# Patient Record
Sex: Male | Born: 1940 | Race: White | Hispanic: No | Marital: Married | Smoking: Former smoker
Health system: Southern US, Community
[De-identification: ages and names within clinical notes are randomized; demographics above are authoritative.]

## PROBLEM LIST (undated history)

## (undated) DIAGNOSIS — J929 Pleural plaque without asbestos: Secondary | ICD-10-CM

## (undated) DIAGNOSIS — J189 Pneumonia, unspecified organism: Secondary | ICD-10-CM

## (undated) DIAGNOSIS — I513 Intracardiac thrombosis, not elsewhere classified: Secondary | ICD-10-CM

## (undated) DIAGNOSIS — K219 Gastro-esophageal reflux disease without esophagitis: Secondary | ICD-10-CM

## (undated) DIAGNOSIS — Z95810 Presence of automatic (implantable) cardiac defibrillator: Secondary | ICD-10-CM

## (undated) DIAGNOSIS — I4589 Other specified conduction disorders: Secondary | ICD-10-CM

## (undated) DIAGNOSIS — Q159 Congenital malformation of eye, unspecified: Secondary | ICD-10-CM

## (undated) DIAGNOSIS — Z79899 Other long term (current) drug therapy: Secondary | ICD-10-CM

## (undated) DIAGNOSIS — I2510 Atherosclerotic heart disease of native coronary artery without angina pectoris: Secondary | ICD-10-CM

## (undated) DIAGNOSIS — I358 Other nonrheumatic aortic valve disorders: Secondary | ICD-10-CM

## (undated) DIAGNOSIS — I872 Venous insufficiency (chronic) (peripheral): Secondary | ICD-10-CM

## (undated) DIAGNOSIS — I472 Ventricular tachycardia: Secondary | ICD-10-CM

## (undated) DIAGNOSIS — E119 Type 2 diabetes mellitus without complications: Secondary | ICD-10-CM

## (undated) DIAGNOSIS — K648 Other hemorrhoids: Secondary | ICD-10-CM

## (undated) DIAGNOSIS — M549 Dorsalgia, unspecified: Secondary | ICD-10-CM

## (undated) DIAGNOSIS — I4892 Unspecified atrial flutter: Secondary | ICD-10-CM

## (undated) DIAGNOSIS — I313 Pericardial effusion (noninflammatory): Secondary | ICD-10-CM

## (undated) DIAGNOSIS — I219 Acute myocardial infarction, unspecified: Secondary | ICD-10-CM

## (undated) DIAGNOSIS — M4800 Spinal stenosis, site unspecified: Secondary | ICD-10-CM

## (undated) DIAGNOSIS — I340 Nonrheumatic mitral (valve) insufficiency: Secondary | ICD-10-CM

## (undated) DIAGNOSIS — I3139 Other pericardial effusion (noninflammatory): Secondary | ICD-10-CM

## (undated) DIAGNOSIS — G8929 Other chronic pain: Secondary | ICD-10-CM

## (undated) DIAGNOSIS — I82419 Acute embolism and thrombosis of unspecified femoral vein: Secondary | ICD-10-CM

## (undated) DIAGNOSIS — I808 Phlebitis and thrombophlebitis of other sites: Secondary | ICD-10-CM

## (undated) DIAGNOSIS — I255 Ischemic cardiomyopathy: Secondary | ICD-10-CM

## (undated) DIAGNOSIS — I714 Abdominal aortic aneurysm, without rupture, unspecified: Secondary | ICD-10-CM

## (undated) DIAGNOSIS — M1990 Unspecified osteoarthritis, unspecified site: Secondary | ICD-10-CM

## (undated) DIAGNOSIS — M109 Gout, unspecified: Secondary | ICD-10-CM

## (undated) DIAGNOSIS — J3489 Other specified disorders of nose and nasal sinuses: Secondary | ICD-10-CM

## (undated) DIAGNOSIS — I10 Essential (primary) hypertension: Secondary | ICD-10-CM

## (undated) DIAGNOSIS — E663 Overweight: Secondary | ICD-10-CM

## (undated) DIAGNOSIS — I77810 Thoracic aortic ectasia: Secondary | ICD-10-CM

## (undated) DIAGNOSIS — R0602 Shortness of breath: Secondary | ICD-10-CM

## (undated) DIAGNOSIS — Q799 Congenital malformation of musculoskeletal system, unspecified: Secondary | ICD-10-CM

## (undated) DIAGNOSIS — R0600 Dyspnea, unspecified: Secondary | ICD-10-CM

## (undated) DIAGNOSIS — Z7901 Long term (current) use of anticoagulants: Secondary | ICD-10-CM

## (undated) DIAGNOSIS — D45 Polycythemia vera: Principal | ICD-10-CM

## (undated) DIAGNOSIS — N183 Chronic kidney disease, stage 3 unspecified: Secondary | ICD-10-CM

## (undated) DIAGNOSIS — J90 Pleural effusion, not elsewhere classified: Secondary | ICD-10-CM

## (undated) DIAGNOSIS — K439 Ventral hernia without obstruction or gangrene: Secondary | ICD-10-CM

## (undated) DIAGNOSIS — D126 Benign neoplasm of colon, unspecified: Secondary | ICD-10-CM

## (undated) DIAGNOSIS — Z950 Presence of cardiac pacemaker: Secondary | ICD-10-CM

## (undated) DIAGNOSIS — Z789 Other specified health status: Secondary | ICD-10-CM

## (undated) DIAGNOSIS — L819 Disorder of pigmentation, unspecified: Secondary | ICD-10-CM

## (undated) DIAGNOSIS — I5022 Chronic systolic (congestive) heart failure: Secondary | ICD-10-CM

## (undated) DIAGNOSIS — E785 Hyperlipidemia, unspecified: Secondary | ICD-10-CM

## (undated) DIAGNOSIS — I4891 Unspecified atrial fibrillation: Secondary | ICD-10-CM

## (undated) DIAGNOSIS — K5790 Diverticulosis of intestine, part unspecified, without perforation or abscess without bleeding: Secondary | ICD-10-CM

## (undated) DIAGNOSIS — R000 Tachycardia, unspecified: Secondary | ICD-10-CM

## (undated) DIAGNOSIS — Z9981 Dependence on supplemental oxygen: Secondary | ICD-10-CM

## (undated) DIAGNOSIS — J449 Chronic obstructive pulmonary disease, unspecified: Secondary | ICD-10-CM

## (undated) HISTORY — DX: Ventricular tachycardia: I47.2

## (undated) HISTORY — DX: Ventral hernia without obstruction or gangrene: K43.9

## (undated) HISTORY — DX: Benign neoplasm of colon, unspecified: D12.6

## (undated) HISTORY — DX: Nonrheumatic mitral (valve) insufficiency: I34.0

## (undated) HISTORY — DX: Disorder of pigmentation, unspecified: L81.9

## (undated) HISTORY — DX: Chronic kidney disease, stage 3 unspecified: N18.30

## (undated) HISTORY — PX: INCISION AND DRAINAGE ABSCESS / HEMATOMA OF BURSA / KNEE / THIGH: SUR668

## (undated) HISTORY — DX: Chronic obstructive pulmonary disease, unspecified: J44.9

## (undated) HISTORY — DX: Acute myocardial infarction, unspecified: I21.9

## (undated) HISTORY — DX: Polycythemia vera: D45

## (undated) HISTORY — DX: Atherosclerotic heart disease of native coronary artery without angina pectoris: I25.10

## (undated) HISTORY — PX: ORIF TIBIA & FIBULA FRACTURES: SHX2131

## (undated) HISTORY — DX: Essential (primary) hypertension: I10

## (undated) HISTORY — DX: Tachycardia, unspecified: R00.0

## (undated) HISTORY — PX: CATARACT EXTRACTION W/ INTRAOCULAR LENS  IMPLANT, BILATERAL: SHX1307

## (undated) HISTORY — DX: Pericardial effusion (noninflammatory): I31.3

## (undated) HISTORY — PX: BACK SURGERY: SHX140

## (undated) HISTORY — DX: Venous insufficiency (chronic) (peripheral): I87.2

## (undated) HISTORY — DX: Other nonrheumatic aortic valve disorders: I35.8

## (undated) HISTORY — DX: Unspecified atrial flutter: I48.92

## (undated) HISTORY — DX: Pleural effusion, not elsewhere classified: J90

## (undated) HISTORY — DX: Other hemorrhoids: K64.8

## (undated) HISTORY — DX: Congenital malformation of eye, unspecified: Q15.9

## (undated) HISTORY — DX: Presence of automatic (implantable) cardiac defibrillator: Z95.810

## (undated) HISTORY — DX: Other specified conduction disorders: I45.89

## (undated) HISTORY — DX: Congenital malformation of musculoskeletal system, unspecified: Q79.9

## (undated) HISTORY — PX: CARDIAC CATHETERIZATION: SHX172

## (undated) HISTORY — DX: Shortness of breath: R06.02

## (undated) HISTORY — DX: Abdominal aortic aneurysm, without rupture, unspecified: I71.40

## (undated) HISTORY — DX: Ischemic cardiomyopathy: I25.5

## (undated) HISTORY — DX: Hyperlipidemia, unspecified: E78.5

## (undated) HISTORY — DX: Thoracic aortic ectasia: I77.810

## (undated) HISTORY — DX: Overweight: E66.3

## (undated) HISTORY — DX: Intracardiac thrombosis, not elsewhere classified: I51.3

## (undated) HISTORY — DX: Unspecified atrial fibrillation: I48.91

## (undated) HISTORY — DX: Diverticulosis of intestine, part unspecified, without perforation or abscess without bleeding: K57.90

## (undated) HISTORY — DX: Other specified health status: Z78.9

## (undated) HISTORY — DX: Phlebitis and thrombophlebitis of other sites: I80.8

## (undated) HISTORY — DX: Other pericardial effusion (noninflammatory): I31.39

## (undated) HISTORY — DX: Long term (current) use of anticoagulants: Z79.01

## (undated) HISTORY — DX: Other specified disorders of nose and nasal sinuses: J34.89

## (undated) HISTORY — DX: Chronic systolic (congestive) heart failure: I50.22

## (undated) HISTORY — DX: Abdominal aortic aneurysm, without rupture: I71.4

## (undated) HISTORY — PX: COLONOSCOPY W/ POLYPECTOMY: SHX1380

## (undated) HISTORY — PX: FRACTURE SURGERY: SHX138

## (undated) HISTORY — DX: Chronic kidney disease, stage 3 (moderate): N18.3

## (undated) HISTORY — PX: PILONIDAL CYST EXCISION: SHX744

## (undated) HISTORY — DX: Spinal stenosis, site unspecified: M48.00

## (undated) HISTORY — PX: SURGERY SCROTAL / TESTICULAR: SUR1316

---

## 1993-03-21 HISTORY — PX: ANTERIOR CERVICAL DECOMP/DISCECTOMY FUSION: SHX1161

## 2001-10-11 ENCOUNTER — Encounter: Payer: Self-pay | Admitting: Internal Medicine

## 2001-11-02 ENCOUNTER — Encounter: Payer: Self-pay | Admitting: Internal Medicine

## 2002-02-06 ENCOUNTER — Inpatient Hospital Stay (HOSPITAL_COMMUNITY): Admission: AD | Admit: 2002-02-06 | Discharge: 2002-02-12 | Payer: Self-pay | Admitting: Internal Medicine

## 2002-02-06 ENCOUNTER — Encounter: Payer: Self-pay | Admitting: Internal Medicine

## 2002-02-07 ENCOUNTER — Encounter: Payer: Self-pay | Admitting: Cardiology

## 2002-02-07 ENCOUNTER — Encounter: Payer: Self-pay | Admitting: Internal Medicine

## 2002-02-11 ENCOUNTER — Encounter: Payer: Self-pay | Admitting: Internal Medicine

## 2002-03-20 ENCOUNTER — Encounter: Payer: Self-pay | Admitting: Internal Medicine

## 2002-03-21 HISTORY — PX: CORONARY ARTERY BYPASS GRAFT: SHX141

## 2002-03-21 HISTORY — PX: CORONARY ANGIOPLASTY WITH STENT PLACEMENT: SHX49

## 2002-04-09 ENCOUNTER — Ambulatory Visit (HOSPITAL_COMMUNITY): Admission: RE | Admit: 2002-04-09 | Discharge: 2002-04-09 | Payer: Self-pay | Admitting: Cardiology

## 2002-04-19 ENCOUNTER — Encounter: Payer: Self-pay | Admitting: Neurological Surgery

## 2002-04-30 ENCOUNTER — Encounter: Payer: Self-pay | Admitting: Neurological Surgery

## 2002-07-15 ENCOUNTER — Encounter: Payer: Self-pay | Admitting: Neurology

## 2002-07-18 ENCOUNTER — Encounter (INDEPENDENT_AMBULATORY_CARE_PROVIDER_SITE_OTHER): Payer: Self-pay | Admitting: *Deleted

## 2002-07-18 ENCOUNTER — Encounter: Admission: RE | Admit: 2002-07-18 | Discharge: 2002-07-18 | Payer: Self-pay | Admitting: *Deleted

## 2002-07-18 ENCOUNTER — Encounter: Payer: Self-pay | Admitting: *Deleted

## 2002-07-25 ENCOUNTER — Ambulatory Visit (HOSPITAL_COMMUNITY): Admission: RE | Admit: 2002-07-25 | Discharge: 2002-07-27 | Payer: Self-pay | Admitting: *Deleted

## 2002-08-08 ENCOUNTER — Ambulatory Visit (HOSPITAL_COMMUNITY): Admission: RE | Admit: 2002-08-08 | Discharge: 2002-08-09 | Payer: Self-pay | Admitting: *Deleted

## 2002-08-30 ENCOUNTER — Encounter: Payer: Self-pay | Admitting: Diagnostic Radiology

## 2002-08-30 ENCOUNTER — Encounter: Payer: Self-pay | Admitting: Neurological Surgery

## 2002-09-03 ENCOUNTER — Encounter: Payer: Self-pay | Admitting: Surgery

## 2002-09-05 ENCOUNTER — Inpatient Hospital Stay (HOSPITAL_COMMUNITY): Admission: RE | Admit: 2002-09-05 | Discharge: 2002-09-11 | Payer: Self-pay | Admitting: Surgery

## 2002-09-05 ENCOUNTER — Encounter: Payer: Self-pay | Admitting: Surgery

## 2002-09-05 ENCOUNTER — Encounter (INDEPENDENT_AMBULATORY_CARE_PROVIDER_SITE_OTHER): Payer: Self-pay | Admitting: Specialist

## 2002-09-06 ENCOUNTER — Encounter: Payer: Self-pay | Admitting: Surgery

## 2002-09-07 ENCOUNTER — Encounter: Payer: Self-pay | Admitting: Surgery

## 2002-09-08 ENCOUNTER — Encounter: Payer: Self-pay | Admitting: Surgery

## 2002-10-08 ENCOUNTER — Inpatient Hospital Stay (HOSPITAL_COMMUNITY): Admission: RE | Admit: 2002-10-08 | Discharge: 2002-10-11 | Payer: Self-pay | Admitting: *Deleted

## 2002-11-01 ENCOUNTER — Encounter: Payer: Self-pay | Admitting: Neurological Surgery

## 2002-11-05 ENCOUNTER — Encounter: Payer: Self-pay | Admitting: Neurological Surgery

## 2002-11-20 HISTORY — PX: ABDOMINAL AORTIC ANEURYSM REPAIR: SUR1152

## 2002-12-02 ENCOUNTER — Encounter: Payer: Self-pay | Admitting: *Deleted

## 2002-12-03 ENCOUNTER — Encounter: Payer: Self-pay | Admitting: *Deleted

## 2002-12-03 ENCOUNTER — Encounter (INDEPENDENT_AMBULATORY_CARE_PROVIDER_SITE_OTHER): Payer: Self-pay | Admitting: *Deleted

## 2002-12-03 ENCOUNTER — Inpatient Hospital Stay (HOSPITAL_COMMUNITY): Admission: RE | Admit: 2002-12-03 | Discharge: 2002-12-08 | Payer: Self-pay | Admitting: *Deleted

## 2002-12-04 ENCOUNTER — Encounter: Payer: Self-pay | Admitting: *Deleted

## 2003-11-10 ENCOUNTER — Inpatient Hospital Stay (HOSPITAL_COMMUNITY): Admission: AD | Admit: 2003-11-10 | Discharge: 2003-11-12 | Payer: Self-pay | Admitting: Orthopedic Surgery

## 2004-01-26 ENCOUNTER — Ambulatory Visit: Payer: Self-pay | Admitting: Cardiology

## 2004-02-04 ENCOUNTER — Ambulatory Visit: Payer: Self-pay | Admitting: Cardiology

## 2004-02-06 ENCOUNTER — Ambulatory Visit: Payer: Self-pay | Admitting: Cardiology

## 2004-02-06 ENCOUNTER — Ambulatory Visit: Payer: Self-pay

## 2004-03-05 ENCOUNTER — Ambulatory Visit: Payer: Self-pay | Admitting: Cardiology

## 2004-04-02 ENCOUNTER — Ambulatory Visit: Payer: Self-pay | Admitting: Cardiovascular Disease

## 2004-04-02 ENCOUNTER — Ambulatory Visit: Payer: Self-pay | Admitting: Cardiology

## 2004-04-23 ENCOUNTER — Ambulatory Visit: Payer: Self-pay | Admitting: Cardiology

## 2004-04-30 ENCOUNTER — Ambulatory Visit: Payer: Self-pay | Admitting: Cardiology

## 2004-05-27 ENCOUNTER — Ambulatory Visit: Payer: Self-pay | Admitting: Internal Medicine

## 2004-06-03 ENCOUNTER — Ambulatory Visit: Payer: Self-pay | Admitting: Cardiology

## 2004-06-18 ENCOUNTER — Ambulatory Visit: Payer: Self-pay | Admitting: Internal Medicine

## 2004-07-01 ENCOUNTER — Ambulatory Visit: Payer: Self-pay | Admitting: Cardiology

## 2004-07-15 ENCOUNTER — Ambulatory Visit: Payer: Self-pay | Admitting: Internal Medicine

## 2004-07-23 ENCOUNTER — Ambulatory Visit: Payer: Self-pay | Admitting: Cardiology

## 2004-07-23 ENCOUNTER — Ambulatory Visit: Payer: Self-pay | Admitting: Cardiovascular Disease

## 2004-07-30 ENCOUNTER — Ambulatory Visit: Payer: Self-pay | Admitting: Cardiology

## 2004-08-12 ENCOUNTER — Ambulatory Visit: Payer: Self-pay | Admitting: Cardiology

## 2004-08-27 ENCOUNTER — Ambulatory Visit: Payer: Self-pay | Admitting: Cardiology

## 2004-09-01 ENCOUNTER — Ambulatory Visit: Payer: Self-pay | Admitting: Cardiology

## 2004-09-17 ENCOUNTER — Ambulatory Visit: Payer: Self-pay | Admitting: Cardiology

## 2004-10-15 ENCOUNTER — Ambulatory Visit: Payer: Self-pay | Admitting: Cardiology

## 2004-10-27 ENCOUNTER — Ambulatory Visit: Payer: Self-pay | Admitting: Cardiology

## 2004-10-29 ENCOUNTER — Ambulatory Visit: Payer: Self-pay | Admitting: Internal Medicine

## 2004-11-05 ENCOUNTER — Ambulatory Visit: Payer: Self-pay | Admitting: Cardiology

## 2004-11-08 ENCOUNTER — Ambulatory Visit: Payer: Self-pay | Admitting: Cardiology

## 2004-11-11 ENCOUNTER — Ambulatory Visit: Payer: Self-pay | Admitting: Cardiology

## 2004-11-12 ENCOUNTER — Ambulatory Visit: Payer: Self-pay | Admitting: Internal Medicine

## 2004-11-23 ENCOUNTER — Ambulatory Visit: Payer: Self-pay | Admitting: Cardiology

## 2004-12-10 ENCOUNTER — Ambulatory Visit: Payer: Self-pay | Admitting: Cardiology

## 2005-01-07 ENCOUNTER — Ambulatory Visit: Payer: Self-pay | Admitting: Cardiology

## 2005-01-14 ENCOUNTER — Encounter: Admission: RE | Admit: 2005-01-14 | Discharge: 2005-03-20 | Payer: Self-pay | Admitting: Internal Medicine

## 2005-02-04 ENCOUNTER — Ambulatory Visit: Payer: Self-pay | Admitting: Cardiology

## 2005-03-04 ENCOUNTER — Ambulatory Visit: Payer: Self-pay | Admitting: Cardiology

## 2005-03-10 ENCOUNTER — Encounter: Admission: RE | Admit: 2005-03-10 | Discharge: 2005-03-10 | Payer: Self-pay | Admitting: Neurological Surgery

## 2005-04-01 ENCOUNTER — Ambulatory Visit: Payer: Self-pay | Admitting: Cardiology

## 2005-04-05 ENCOUNTER — Ambulatory Visit: Payer: Self-pay | Admitting: Internal Medicine

## 2005-04-08 ENCOUNTER — Ambulatory Visit: Payer: Self-pay | Admitting: Internal Medicine

## 2005-04-22 ENCOUNTER — Ambulatory Visit: Payer: Self-pay | Admitting: Cardiology

## 2005-05-05 ENCOUNTER — Ambulatory Visit: Payer: Self-pay | Admitting: Cardiology

## 2005-05-27 ENCOUNTER — Ambulatory Visit: Payer: Self-pay | Admitting: Cardiology

## 2005-06-08 ENCOUNTER — Ambulatory Visit: Payer: Self-pay | Admitting: Cardiology

## 2005-06-22 ENCOUNTER — Ambulatory Visit: Payer: Self-pay | Admitting: Cardiology

## 2005-07-01 ENCOUNTER — Ambulatory Visit: Payer: Self-pay | Admitting: Internal Medicine

## 2005-07-21 ENCOUNTER — Ambulatory Visit: Payer: Self-pay | Admitting: Cardiology

## 2005-08-05 ENCOUNTER — Ambulatory Visit: Payer: Self-pay | Admitting: Cardiology

## 2005-08-05 ENCOUNTER — Ambulatory Visit: Payer: Self-pay | Admitting: Internal Medicine

## 2005-08-11 ENCOUNTER — Ambulatory Visit: Payer: Self-pay | Admitting: Cardiology

## 2005-08-18 ENCOUNTER — Ambulatory Visit: Payer: Self-pay | Admitting: Cardiology

## 2005-08-25 ENCOUNTER — Ambulatory Visit: Payer: Self-pay

## 2005-08-26 ENCOUNTER — Ambulatory Visit: Payer: Self-pay

## 2005-08-26 ENCOUNTER — Ambulatory Visit: Payer: Self-pay | Admitting: Internal Medicine

## 2005-09-06 ENCOUNTER — Ambulatory Visit: Payer: Self-pay | Admitting: Cardiology

## 2005-09-12 ENCOUNTER — Ambulatory Visit: Payer: Self-pay | Admitting: Cardiology

## 2005-09-26 ENCOUNTER — Ambulatory Visit: Payer: Self-pay | Admitting: Internal Medicine

## 2005-09-30 ENCOUNTER — Ambulatory Visit: Payer: Self-pay | Admitting: Cardiology

## 2005-10-04 ENCOUNTER — Ambulatory Visit: Payer: Self-pay | Admitting: Cardiology

## 2005-10-10 ENCOUNTER — Ambulatory Visit: Payer: Self-pay | Admitting: Cardiology

## 2005-10-11 ENCOUNTER — Ambulatory Visit (HOSPITAL_COMMUNITY): Admission: RE | Admit: 2005-10-11 | Discharge: 2005-10-11 | Payer: Self-pay | Admitting: Cardiovascular Disease

## 2005-10-11 ENCOUNTER — Ambulatory Visit: Payer: Self-pay | Admitting: Cardiology

## 2005-10-18 ENCOUNTER — Ambulatory Visit: Payer: Self-pay | Admitting: Cardiology

## 2005-10-25 ENCOUNTER — Ambulatory Visit: Payer: Self-pay | Admitting: Cardiology

## 2005-10-28 ENCOUNTER — Ambulatory Visit: Payer: Self-pay | Admitting: Internal Medicine

## 2005-11-01 ENCOUNTER — Encounter: Admission: RE | Admit: 2005-11-01 | Discharge: 2005-11-01 | Payer: Self-pay | Admitting: Neurological Surgery

## 2005-11-16 ENCOUNTER — Ambulatory Visit: Payer: Self-pay | Admitting: Cardiology

## 2005-12-05 ENCOUNTER — Inpatient Hospital Stay (HOSPITAL_COMMUNITY): Admission: RE | Admit: 2005-12-05 | Discharge: 2005-12-05 | Payer: Self-pay | Admitting: Neurological Surgery

## 2005-12-13 ENCOUNTER — Ambulatory Visit: Payer: Self-pay | Admitting: Cardiovascular Disease

## 2005-12-20 ENCOUNTER — Ambulatory Visit: Payer: Self-pay | Admitting: Cardiology

## 2005-12-20 ENCOUNTER — Ambulatory Visit: Payer: Self-pay | Admitting: *Deleted

## 2005-12-30 ENCOUNTER — Ambulatory Visit: Payer: Self-pay | Admitting: Cardiology

## 2005-12-31 ENCOUNTER — Emergency Department (HOSPITAL_COMMUNITY): Admission: EM | Admit: 2005-12-31 | Discharge: 2006-01-01 | Payer: Self-pay | Admitting: Emergency Medicine

## 2006-01-25 ENCOUNTER — Ambulatory Visit: Payer: Self-pay | Admitting: Internal Medicine

## 2006-01-26 ENCOUNTER — Ambulatory Visit: Payer: Self-pay | Admitting: Internal Medicine

## 2006-01-27 ENCOUNTER — Ambulatory Visit (HOSPITAL_COMMUNITY): Admission: RE | Admit: 2006-01-27 | Discharge: 2006-01-27 | Payer: Self-pay | Admitting: Neurological Surgery

## 2006-02-13 ENCOUNTER — Ambulatory Visit: Payer: Self-pay | Admitting: Cardiology

## 2006-02-20 ENCOUNTER — Encounter (INDEPENDENT_AMBULATORY_CARE_PROVIDER_SITE_OTHER): Payer: Self-pay | Admitting: *Deleted

## 2006-02-21 ENCOUNTER — Ambulatory Visit: Payer: Self-pay | Admitting: Cardiology

## 2006-03-07 ENCOUNTER — Ambulatory Visit: Payer: Self-pay | Admitting: Cardiology

## 2006-03-21 DIAGNOSIS — E119 Type 2 diabetes mellitus without complications: Secondary | ICD-10-CM

## 2006-03-21 HISTORY — DX: Type 2 diabetes mellitus without complications: E11.9

## 2006-03-29 ENCOUNTER — Ambulatory Visit: Payer: Self-pay | Admitting: Cardiology

## 2006-04-07 ENCOUNTER — Ambulatory Visit: Payer: Self-pay | Admitting: Internal Medicine

## 2006-04-26 ENCOUNTER — Ambulatory Visit: Payer: Self-pay | Admitting: Internal Medicine

## 2006-04-26 LAB — CONVERTED CEMR LAB: Hgb A1c MFr Bld: 5.9 % (ref 4.6–6.0)

## 2006-04-28 ENCOUNTER — Ambulatory Visit: Payer: Self-pay | Admitting: Internal Medicine

## 2006-05-30 ENCOUNTER — Ambulatory Visit: Payer: Self-pay | Admitting: Cardiology

## 2006-06-07 ENCOUNTER — Ambulatory Visit: Payer: Self-pay | Admitting: Cardiology

## 2006-06-21 ENCOUNTER — Ambulatory Visit: Payer: Self-pay | Admitting: Cardiology

## 2006-06-21 LAB — CONVERTED CEMR LAB
AST: 25 units/L (ref 0–37)
Albumin: 3 g/dL — ABNORMAL LOW (ref 3.5–5.2)
Alkaline Phosphatase: 60 units/L (ref 39–117)
BUN: 26 mg/dL — ABNORMAL HIGH (ref 6–23)
Basophils Absolute: 0 10*3/uL (ref 0.0–0.1)
Basophils Relative: 0.3 % (ref 0.0–1.0)
CO2: 27 meq/L (ref 19–32)
Chloride: 112 meq/L (ref 96–112)
Creatinine, Ser: 1.7 mg/dL — ABNORMAL HIGH (ref 0.4–1.5)
HCT: 41.8 % (ref 39.0–52.0)
Hemoglobin: 14.4 g/dL (ref 13.0–17.0)
MCHC: 34.4 g/dL (ref 30.0–36.0)
Monocytes Absolute: 0.5 10*3/uL (ref 0.2–0.7)
Neutrophils Relative %: 83.6 % — ABNORMAL HIGH (ref 43.0–77.0)
Potassium: 5 meq/L (ref 3.5–5.1)
RBC: 4.28 M/uL (ref 4.22–5.81)
RDW: 14.3 % (ref 11.5–14.6)
Sodium: 144 meq/L (ref 135–145)
Total Bilirubin: 0.8 mg/dL (ref 0.3–1.2)
Total Protein: 5.9 g/dL — ABNORMAL LOW (ref 6.0–8.3)
WBC: 6.2 10*3/uL (ref 4.5–10.5)

## 2006-06-26 ENCOUNTER — Ambulatory Visit: Payer: Self-pay

## 2006-06-26 ENCOUNTER — Encounter: Payer: Self-pay | Admitting: Cardiology

## 2006-07-03 ENCOUNTER — Ambulatory Visit: Payer: Self-pay | Admitting: Cardiology

## 2006-07-18 ENCOUNTER — Ambulatory Visit: Payer: Self-pay | Admitting: Cardiology

## 2006-07-27 DIAGNOSIS — Z9889 Other specified postprocedural states: Secondary | ICD-10-CM

## 2006-07-27 DIAGNOSIS — M109 Gout, unspecified: Secondary | ICD-10-CM | POA: Insufficient documentation

## 2006-07-27 DIAGNOSIS — Z951 Presence of aortocoronary bypass graft: Secondary | ICD-10-CM

## 2006-08-01 ENCOUNTER — Ambulatory Visit: Payer: Self-pay | Admitting: Cardiology

## 2006-08-02 ENCOUNTER — Ambulatory Visit: Payer: Self-pay | Admitting: Cardiology

## 2006-08-02 ENCOUNTER — Encounter: Payer: Self-pay | Admitting: Internal Medicine

## 2006-08-02 LAB — CONVERTED CEMR LAB
CO2: 29 meq/L (ref 19–32)
Creatinine, Ser: 2.2 mg/dL — ABNORMAL HIGH (ref 0.4–1.5)
GFR calc Af Amer: 39 mL/min
Potassium: 3.6 meq/L (ref 3.5–5.1)
Sodium: 145 meq/L (ref 135–145)

## 2006-08-23 ENCOUNTER — Ambulatory Visit: Payer: Self-pay | Admitting: Cardiology

## 2006-08-23 LAB — CONVERTED CEMR LAB
CO2: 28 meq/L (ref 19–32)
GFR calc Af Amer: 78 mL/min
GFR calc non Af Amer: 65 mL/min
Glucose, Bld: 61 mg/dL — ABNORMAL LOW (ref 70–99)
Potassium: 3.8 meq/L (ref 3.5–5.1)
Sodium: 141 meq/L (ref 135–145)

## 2006-09-06 ENCOUNTER — Ambulatory Visit: Payer: Self-pay

## 2006-09-06 ENCOUNTER — Encounter: Payer: Self-pay | Admitting: Cardiology

## 2006-09-06 ENCOUNTER — Ambulatory Visit: Payer: Self-pay | Admitting: Cardiology

## 2006-09-06 ENCOUNTER — Encounter: Payer: Self-pay | Admitting: Internal Medicine

## 2006-09-14 ENCOUNTER — Ambulatory Visit: Payer: Self-pay | Admitting: Cardiology

## 2006-09-25 ENCOUNTER — Telehealth (INDEPENDENT_AMBULATORY_CARE_PROVIDER_SITE_OTHER): Payer: Self-pay | Admitting: *Deleted

## 2006-09-25 ENCOUNTER — Ambulatory Visit: Payer: Self-pay | Admitting: Cardiology

## 2006-09-25 LAB — CONVERTED CEMR LAB
BUN: 23 mg/dL (ref 6–23)
Basophils Absolute: 0.1 10*3/uL (ref 0.0–0.1)
Calcium: 8.7 mg/dL (ref 8.4–10.5)
Eosinophils Absolute: 0.1 10*3/uL (ref 0.0–0.6)
Eosinophils Relative: 0.8 % (ref 0.0–5.0)
GFR calc Af Amer: 60 mL/min
GFR calc non Af Amer: 50 mL/min
Glucose, Bld: 83 mg/dL (ref 70–99)
Lymphocytes Relative: 13.2 % (ref 12.0–46.0)
MCHC: 33.9 g/dL (ref 30.0–36.0)
MCV: 98.4 fL (ref 78.0–100.0)
Monocytes Relative: 8.7 % (ref 3.0–11.0)
Neutro Abs: 5.6 10*3/uL (ref 1.4–7.7)
Platelets: 173 10*3/uL (ref 150–400)
aPTT: 24.6 s — ABNORMAL LOW (ref 26.5–36.5)

## 2006-09-26 ENCOUNTER — Inpatient Hospital Stay (HOSPITAL_BASED_OUTPATIENT_CLINIC_OR_DEPARTMENT_OTHER): Admission: RE | Admit: 2006-09-26 | Discharge: 2006-09-26 | Payer: Self-pay | Admitting: Cardiovascular Disease

## 2006-09-26 ENCOUNTER — Ambulatory Visit: Payer: Self-pay | Admitting: Cardiovascular Disease

## 2006-09-28 ENCOUNTER — Ambulatory Visit: Payer: Self-pay | Admitting: Cardiology

## 2006-09-28 ENCOUNTER — Ambulatory Visit: Payer: Self-pay | Admitting: Cardiovascular Disease

## 2006-10-05 ENCOUNTER — Ambulatory Visit: Payer: Self-pay | Admitting: Cardiology

## 2006-10-05 LAB — CONVERTED CEMR LAB
Calcium: 9.2 mg/dL (ref 8.4–10.5)
GFR calc Af Amer: 60 mL/min
GFR calc non Af Amer: 50 mL/min
Glucose, Bld: 136 mg/dL — ABNORMAL HIGH (ref 70–99)
Sodium: 142 meq/L (ref 135–145)

## 2006-10-09 ENCOUNTER — Ambulatory Visit: Admission: RE | Admit: 2006-10-09 | Discharge: 2006-10-09 | Payer: Self-pay | Admitting: Cardiology

## 2006-10-12 ENCOUNTER — Ambulatory Visit: Payer: Self-pay | Admitting: Internal Medicine

## 2006-10-19 ENCOUNTER — Ambulatory Visit: Payer: Self-pay | Admitting: Cardiology

## 2006-10-27 ENCOUNTER — Ambulatory Visit: Payer: Self-pay | Admitting: Cardiology

## 2006-11-02 ENCOUNTER — Ambulatory Visit: Payer: Self-pay | Admitting: Internal Medicine

## 2006-11-09 ENCOUNTER — Ambulatory Visit: Payer: Self-pay | Admitting: Cardiology

## 2006-11-16 ENCOUNTER — Ambulatory Visit: Payer: Self-pay | Admitting: Cardiology

## 2006-11-27 ENCOUNTER — Ambulatory Visit: Payer: Self-pay | Admitting: Cardiology

## 2006-11-29 ENCOUNTER — Ambulatory Visit: Payer: Self-pay | Admitting: Cardiology

## 2006-12-04 ENCOUNTER — Ambulatory Visit: Payer: Self-pay | Admitting: Internal Medicine

## 2006-12-04 DIAGNOSIS — N400 Enlarged prostate without lower urinary tract symptoms: Secondary | ICD-10-CM

## 2006-12-04 DIAGNOSIS — D126 Benign neoplasm of colon, unspecified: Secondary | ICD-10-CM

## 2006-12-04 LAB — CONVERTED CEMR LAB: LDL Goal: 70 mg/dL

## 2006-12-06 LAB — CONVERTED CEMR LAB
Creatinine, Ser: 1.4 mg/dL (ref 0.4–1.5)
Creatinine,U: 24 mg/dL
PSA: 0.65 ng/mL (ref 0.10–4.00)
TSH: 0.41 microintl units/mL (ref 0.35–5.50)
Total CHOL/HDL Ratio: 2.1
Triglycerides: 97 mg/dL (ref 0–149)
Uric Acid, Serum: 8.9 mg/dL — ABNORMAL HIGH (ref 2.4–7.0)
VLDL: 19 mg/dL (ref 0–40)

## 2006-12-07 ENCOUNTER — Encounter (INDEPENDENT_AMBULATORY_CARE_PROVIDER_SITE_OTHER): Payer: Self-pay | Admitting: *Deleted

## 2006-12-10 ENCOUNTER — Emergency Department (HOSPITAL_COMMUNITY): Admission: EM | Admit: 2006-12-10 | Discharge: 2006-12-10 | Payer: Self-pay | Admitting: Emergency Medicine

## 2006-12-13 ENCOUNTER — Ambulatory Visit: Payer: Self-pay | Admitting: Cardiology

## 2006-12-15 ENCOUNTER — Encounter: Admission: RE | Admit: 2006-12-15 | Discharge: 2006-12-15 | Payer: Self-pay | Admitting: Neurological Surgery

## 2007-01-02 ENCOUNTER — Ambulatory Visit: Payer: Self-pay | Admitting: Cardiovascular Disease

## 2007-01-24 ENCOUNTER — Ambulatory Visit: Payer: Self-pay | Admitting: Cardiology

## 2007-01-24 LAB — CONVERTED CEMR LAB
GFR calc non Af Amer: 46 mL/min
Potassium: 4 meq/L (ref 3.5–5.1)
Sodium: 143 meq/L (ref 135–145)

## 2007-01-30 ENCOUNTER — Ambulatory Visit: Payer: Self-pay

## 2007-01-30 ENCOUNTER — Encounter: Payer: Self-pay | Admitting: Cardiology

## 2007-02-26 ENCOUNTER — Telehealth (INDEPENDENT_AMBULATORY_CARE_PROVIDER_SITE_OTHER): Payer: Self-pay | Admitting: *Deleted

## 2007-02-28 ENCOUNTER — Ambulatory Visit: Payer: Self-pay | Admitting: Cardiology

## 2007-02-28 ENCOUNTER — Telehealth (INDEPENDENT_AMBULATORY_CARE_PROVIDER_SITE_OTHER): Payer: Self-pay | Admitting: *Deleted

## 2007-03-05 ENCOUNTER — Ambulatory Visit: Payer: Self-pay | Admitting: Internal Medicine

## 2007-03-12 ENCOUNTER — Ambulatory Visit: Payer: Self-pay | Admitting: Cardiology

## 2007-03-22 HISTORY — PX: INSERT / REPLACE / REMOVE PACEMAKER: SUR710

## 2007-03-26 ENCOUNTER — Ambulatory Visit: Payer: Self-pay | Admitting: Cardiology

## 2007-04-05 ENCOUNTER — Ambulatory Visit: Payer: Self-pay | Admitting: Internal Medicine

## 2007-04-05 LAB — CONVERTED CEMR LAB
Basophils Relative: 1 % (ref 0.0–1.0)
CO2: 27 meq/L (ref 19–32)
Calcium: 9 mg/dL (ref 8.4–10.5)
Chloride: 102 meq/L (ref 96–112)
Eosinophils Absolute: 0 10*3/uL (ref 0.0–0.6)
Eosinophils Relative: 0.5 % (ref 0.0–5.0)
GFR calc non Af Amer: 46 mL/min
Glucose, Bld: 78 mg/dL (ref 70–99)
Platelets: 200 10*3/uL (ref 150–400)
RBC: 4.72 M/uL (ref 4.22–5.81)
WBC: 9 10*3/uL (ref 4.5–10.5)

## 2007-04-10 ENCOUNTER — Ambulatory Visit: Payer: Self-pay | Admitting: Cardiology

## 2007-04-11 ENCOUNTER — Ambulatory Visit: Payer: Self-pay | Admitting: Internal Medicine

## 2007-04-11 ENCOUNTER — Ambulatory Visit (HOSPITAL_COMMUNITY): Admission: RE | Admit: 2007-04-11 | Discharge: 2007-04-12 | Payer: Self-pay | Admitting: Internal Medicine

## 2007-04-19 ENCOUNTER — Ambulatory Visit: Payer: Self-pay | Admitting: Cardiology

## 2007-04-19 ENCOUNTER — Ambulatory Visit: Payer: Self-pay

## 2007-04-23 ENCOUNTER — Encounter (INDEPENDENT_AMBULATORY_CARE_PROVIDER_SITE_OTHER): Payer: Self-pay | Admitting: *Deleted

## 2007-05-08 ENCOUNTER — Ambulatory Visit: Payer: Self-pay | Admitting: Cardiology

## 2007-05-08 ENCOUNTER — Ambulatory Visit: Payer: Self-pay | Admitting: Internal Medicine

## 2007-05-28 ENCOUNTER — Ambulatory Visit: Payer: Self-pay | Admitting: Cardiology

## 2007-06-11 ENCOUNTER — Telehealth: Payer: Self-pay | Admitting: Internal Medicine

## 2007-06-19 ENCOUNTER — Ambulatory Visit: Payer: Self-pay | Admitting: Internal Medicine

## 2007-06-25 ENCOUNTER — Encounter (INDEPENDENT_AMBULATORY_CARE_PROVIDER_SITE_OTHER): Payer: Self-pay | Admitting: *Deleted

## 2007-06-25 LAB — CONVERTED CEMR LAB
ALT: 50 units/L (ref 0–53)
Albumin: 3.6 g/dL (ref 3.5–5.2)
BUN: 16 mg/dL (ref 6–23)
HDL: 77.1 mg/dL (ref >39.0)
Potassium: 4.1 meq/L (ref 3.5–5.1)
Total Bilirubin: 0.8 mg/dL (ref 0.3–1.2)
Triglycerides: 94 mg/dL (ref 0–149)
VLDL: 19 mg/dL (ref 0–40)

## 2007-06-27 ENCOUNTER — Ambulatory Visit (HOSPITAL_COMMUNITY): Admission: RE | Admit: 2007-06-27 | Discharge: 2007-06-27 | Payer: Self-pay | Admitting: Cardiology

## 2007-06-27 ENCOUNTER — Ambulatory Visit: Payer: Self-pay | Admitting: Internal Medicine

## 2007-06-28 ENCOUNTER — Ambulatory Visit: Payer: Self-pay | Admitting: Cardiology

## 2007-06-28 ENCOUNTER — Ambulatory Visit: Payer: Self-pay | Admitting: Cardiovascular Disease

## 2007-07-10 ENCOUNTER — Ambulatory Visit: Payer: Self-pay | Admitting: Internal Medicine

## 2007-07-11 ENCOUNTER — Encounter (INDEPENDENT_AMBULATORY_CARE_PROVIDER_SITE_OTHER): Payer: Self-pay | Admitting: *Deleted

## 2007-07-11 LAB — CONVERTED CEMR LAB: Microalb, Ur: 1 mg/dL (ref 0.0–1.9)

## 2007-07-12 ENCOUNTER — Ambulatory Visit: Payer: Self-pay | Admitting: *Deleted

## 2007-07-17 ENCOUNTER — Ambulatory Visit: Payer: Self-pay | Admitting: Internal Medicine

## 2007-07-21 LAB — CONVERTED CEMR LAB
Alkaline Phosphatase: 75 units/L (ref 39–117)
Bilirubin, Direct: 0.1 mg/dL (ref 0.0–0.3)
Cholesterol: 179 mg/dL (ref 0–200)
LDL Cholesterol: 104 mg/dL — ABNORMAL HIGH (ref 0–99)
Total Protein: 6.9 g/dL (ref 6.0–8.3)

## 2007-07-30 ENCOUNTER — Ambulatory Visit: Payer: Self-pay | Admitting: Internal Medicine

## 2007-07-30 ENCOUNTER — Ambulatory Visit: Payer: Self-pay | Admitting: Cardiology

## 2007-08-09 ENCOUNTER — Ambulatory Visit: Payer: Self-pay | Admitting: Internal Medicine

## 2007-08-10 ENCOUNTER — Encounter (INDEPENDENT_AMBULATORY_CARE_PROVIDER_SITE_OTHER): Payer: Self-pay | Admitting: *Deleted

## 2007-08-22 ENCOUNTER — Ambulatory Visit: Payer: Self-pay | Admitting: Cardiology

## 2007-09-05 ENCOUNTER — Ambulatory Visit: Payer: Self-pay | Admitting: Cardiology

## 2007-09-05 ENCOUNTER — Ambulatory Visit: Payer: Self-pay | Admitting: Internal Medicine

## 2007-09-17 ENCOUNTER — Ambulatory Visit: Payer: Self-pay | Admitting: Cardiology

## 2007-09-18 ENCOUNTER — Telehealth (INDEPENDENT_AMBULATORY_CARE_PROVIDER_SITE_OTHER): Payer: Self-pay | Admitting: *Deleted

## 2007-09-19 ENCOUNTER — Ambulatory Visit: Payer: Self-pay | Admitting: Internal Medicine

## 2007-10-03 ENCOUNTER — Ambulatory Visit: Payer: Self-pay | Admitting: Cardiology

## 2007-10-25 ENCOUNTER — Ambulatory Visit: Payer: Self-pay

## 2007-11-01 ENCOUNTER — Ambulatory Visit: Payer: Self-pay | Admitting: Internal Medicine

## 2007-12-04 ENCOUNTER — Ambulatory Visit: Payer: Self-pay | Admitting: Cardiology

## 2007-12-04 ENCOUNTER — Ambulatory Visit: Payer: Self-pay | Admitting: Cardiovascular Disease

## 2007-12-05 ENCOUNTER — Encounter: Payer: Self-pay | Admitting: Internal Medicine

## 2007-12-10 ENCOUNTER — Encounter: Payer: Self-pay | Admitting: Internal Medicine

## 2007-12-11 ENCOUNTER — Ambulatory Visit: Payer: Self-pay | Admitting: Internal Medicine

## 2007-12-11 LAB — CONVERTED CEMR LAB
CO2: 27 meq/L (ref 19–32)
Chloride: 109 meq/L (ref 96–112)
Glucose, Bld: 204 mg/dL — ABNORMAL HIGH (ref 70–99)
Potassium: 3.9 meq/L (ref 3.5–5.1)
Pro B Natriuretic peptide (BNP): 356 pg/mL — ABNORMAL HIGH (ref 0.0–100.0)
Sed Rate: 19 mm/hr — ABNORMAL HIGH (ref 0–16)
Sodium: 142 meq/L (ref 135–145)

## 2008-01-01 ENCOUNTER — Ambulatory Visit: Payer: Self-pay | Admitting: Cardiology

## 2008-01-01 ENCOUNTER — Ambulatory Visit: Payer: Self-pay | Admitting: Internal Medicine

## 2008-01-29 ENCOUNTER — Ambulatory Visit: Payer: Self-pay | Admitting: Internal Medicine

## 2008-02-11 ENCOUNTER — Ambulatory Visit: Payer: Self-pay | Admitting: Internal Medicine

## 2008-02-12 ENCOUNTER — Ambulatory Visit: Payer: Self-pay | Admitting: Cardiology

## 2008-02-19 ENCOUNTER — Encounter (INDEPENDENT_AMBULATORY_CARE_PROVIDER_SITE_OTHER): Payer: Self-pay | Admitting: *Deleted

## 2008-02-26 ENCOUNTER — Ambulatory Visit: Payer: Self-pay | Admitting: Internal Medicine

## 2008-02-27 ENCOUNTER — Ambulatory Visit: Payer: Self-pay | Admitting: Cardiovascular Disease

## 2008-03-21 HISTORY — PX: LUMBAR FUSION: SHX111

## 2008-04-01 ENCOUNTER — Encounter: Payer: Self-pay | Admitting: Internal Medicine

## 2008-04-02 ENCOUNTER — Telehealth: Payer: Self-pay | Admitting: Internal Medicine

## 2008-04-03 ENCOUNTER — Ambulatory Visit: Payer: Self-pay | Admitting: Cardiology

## 2008-04-03 ENCOUNTER — Ambulatory Visit: Payer: Self-pay | Admitting: Internal Medicine

## 2008-04-07 ENCOUNTER — Ambulatory Visit: Payer: Self-pay | Admitting: Internal Medicine

## 2008-04-13 LAB — CONVERTED CEMR LAB
Creatinine,U: 11.3 mg/dL
Hgb A1c MFr Bld: 5.9 % (ref 4.6–6.0)
Microalb Creat Ratio: 44.2 mg/g — ABNORMAL HIGH (ref 0.0–30.0)
Microalb, Ur: 0.5 mg/dL (ref 0.0–1.9)

## 2008-04-16 ENCOUNTER — Encounter (INDEPENDENT_AMBULATORY_CARE_PROVIDER_SITE_OTHER): Payer: Self-pay | Admitting: *Deleted

## 2008-04-29 ENCOUNTER — Ambulatory Visit: Payer: Self-pay | Admitting: Internal Medicine

## 2008-05-14 ENCOUNTER — Ambulatory Visit: Payer: Self-pay | Admitting: Cardiology

## 2008-05-26 ENCOUNTER — Encounter: Payer: Self-pay | Admitting: Cardiology

## 2008-05-26 ENCOUNTER — Ambulatory Visit: Payer: Self-pay | Admitting: Cardiology

## 2008-06-04 ENCOUNTER — Ambulatory Visit: Payer: Self-pay | Admitting: Cardiology

## 2008-06-11 ENCOUNTER — Ambulatory Visit: Payer: Self-pay | Admitting: Internal Medicine

## 2008-06-12 ENCOUNTER — Encounter: Payer: Self-pay | Admitting: Cardiology

## 2008-06-12 ENCOUNTER — Ambulatory Visit: Payer: Self-pay | Admitting: Cardiology

## 2008-06-12 DIAGNOSIS — M545 Low back pain, unspecified: Secondary | ICD-10-CM | POA: Insufficient documentation

## 2008-06-12 DIAGNOSIS — I959 Hypotension, unspecified: Secondary | ICD-10-CM | POA: Insufficient documentation

## 2008-06-12 DIAGNOSIS — I739 Peripheral vascular disease, unspecified: Secondary | ICD-10-CM

## 2008-06-12 LAB — CONVERTED CEMR LAB
Calcium: 8.8 mg/dL (ref 8.4–10.5)
Creatinine, Ser: 1.6 mg/dL — ABNORMAL HIGH (ref 0.4–1.5)
GFR calc non Af Amer: 46.03 mL/min (ref >60)
Glucose, Bld: 173 mg/dL — ABNORMAL HIGH (ref 70–99)
Sodium: 141 meq/L (ref 135–145)

## 2008-06-17 ENCOUNTER — Encounter: Payer: Self-pay | Admitting: Internal Medicine

## 2008-06-17 LAB — CONVERTED CEMR LAB
AST: 20 units/L (ref 0–37)
Alkaline Phosphatase: 69 units/L (ref 39–117)
BUN: 23 mg/dL (ref 6–23)
HDL: 81.5 mg/dL (ref >39.00)
Hgb A1c MFr Bld: 7.1 % — ABNORMAL HIGH (ref 4.6–6.5)
LDL Cholesterol: 97 mg/dL (ref 0–99)
Potassium: 4.3 meq/L (ref 3.5–5.1)
Total Bilirubin: 1 mg/dL (ref 0.3–1.2)
VLDL: 14 mg/dL (ref 0.0–40.0)

## 2008-06-18 ENCOUNTER — Telehealth (INDEPENDENT_AMBULATORY_CARE_PROVIDER_SITE_OTHER): Payer: Self-pay | Admitting: *Deleted

## 2008-06-18 ENCOUNTER — Encounter: Payer: Self-pay | Admitting: Cardiology

## 2008-06-18 ENCOUNTER — Encounter (INDEPENDENT_AMBULATORY_CARE_PROVIDER_SITE_OTHER): Payer: Self-pay | Admitting: *Deleted

## 2008-06-19 ENCOUNTER — Ambulatory Visit: Payer: Self-pay | Admitting: Internal Medicine

## 2008-06-19 DIAGNOSIS — J310 Chronic rhinitis: Secondary | ICD-10-CM | POA: Insufficient documentation

## 2008-06-25 ENCOUNTER — Encounter (INDEPENDENT_AMBULATORY_CARE_PROVIDER_SITE_OTHER): Payer: Self-pay | Admitting: *Deleted

## 2008-07-02 ENCOUNTER — Ambulatory Visit: Payer: Self-pay | Admitting: Cardiovascular Disease

## 2008-07-03 ENCOUNTER — Ambulatory Visit: Payer: Self-pay | Admitting: Cardiology

## 2008-07-03 ENCOUNTER — Encounter: Payer: Self-pay | Admitting: Cardiology

## 2008-07-10 ENCOUNTER — Ambulatory Visit: Payer: Self-pay | Admitting: Internal Medicine

## 2008-07-16 ENCOUNTER — Encounter: Payer: Self-pay | Admitting: Internal Medicine

## 2008-07-28 ENCOUNTER — Ambulatory Visit: Payer: Self-pay | Admitting: Cardiology

## 2008-07-28 ENCOUNTER — Ambulatory Visit: Payer: Self-pay | Admitting: Cardiovascular Disease

## 2008-07-29 ENCOUNTER — Ambulatory Visit: Payer: Self-pay | Admitting: Internal Medicine

## 2008-07-29 DIAGNOSIS — Z95810 Presence of automatic (implantable) cardiac defibrillator: Secondary | ICD-10-CM | POA: Insufficient documentation

## 2008-07-30 ENCOUNTER — Encounter: Payer: Self-pay | Admitting: Cardiology

## 2008-07-31 ENCOUNTER — Ambulatory Visit: Payer: Self-pay | Admitting: Internal Medicine

## 2008-08-03 LAB — CONVERTED CEMR LAB
Creatinine, Ser: 1.4 mg/dL (ref 0.4–1.5)
Hgb A1c MFr Bld: 6.4 % (ref 4.6–6.5)

## 2008-08-04 ENCOUNTER — Encounter (INDEPENDENT_AMBULATORY_CARE_PROVIDER_SITE_OTHER): Payer: Self-pay | Admitting: *Deleted

## 2008-08-08 ENCOUNTER — Ambulatory Visit (HOSPITAL_COMMUNITY): Admission: RE | Admit: 2008-08-08 | Discharge: 2008-08-08 | Payer: Self-pay | Admitting: Neurological Surgery

## 2008-08-11 ENCOUNTER — Ambulatory Visit: Payer: Self-pay | Admitting: Cardiology

## 2008-08-11 ENCOUNTER — Ambulatory Visit: Payer: Self-pay | Admitting: Cardiovascular Disease

## 2008-08-13 ENCOUNTER — Encounter: Payer: Self-pay | Admitting: Cardiology

## 2008-08-19 ENCOUNTER — Encounter: Payer: Self-pay | Admitting: *Deleted

## 2008-08-25 ENCOUNTER — Telehealth: Payer: Self-pay | Admitting: Cardiology

## 2008-08-25 ENCOUNTER — Ambulatory Visit: Payer: Self-pay | Admitting: Cardiovascular Disease

## 2008-09-08 ENCOUNTER — Ambulatory Visit: Payer: Self-pay | Admitting: Internal Medicine

## 2008-09-08 LAB — CONVERTED CEMR LAB
POC INR: 1.8
Protime: 16.7

## 2008-09-10 ENCOUNTER — Encounter (INDEPENDENT_AMBULATORY_CARE_PROVIDER_SITE_OTHER): Payer: Self-pay | Admitting: *Deleted

## 2008-09-12 ENCOUNTER — Encounter: Payer: Self-pay | Admitting: Internal Medicine

## 2008-09-24 ENCOUNTER — Encounter: Payer: Self-pay | Admitting: *Deleted

## 2008-09-29 ENCOUNTER — Encounter: Payer: Self-pay | Admitting: Cardiology

## 2008-09-30 ENCOUNTER — Ambulatory Visit: Payer: Self-pay | Admitting: Cardiology

## 2008-09-30 ENCOUNTER — Ambulatory Visit: Payer: Self-pay | Admitting: Cardiovascular Disease

## 2008-09-30 LAB — CONVERTED CEMR LAB: Prothrombin Time: 12.4 s

## 2008-10-02 ENCOUNTER — Ambulatory Visit: Payer: Self-pay | Admitting: Cardiology

## 2008-10-02 ENCOUNTER — Ambulatory Visit: Payer: Self-pay | Admitting: Internal Medicine

## 2008-10-02 ENCOUNTER — Inpatient Hospital Stay (HOSPITAL_COMMUNITY): Admission: RE | Admit: 2008-10-02 | Discharge: 2008-10-16 | Payer: Self-pay | Admitting: Neurological Surgery

## 2008-10-05 ENCOUNTER — Encounter: Payer: Self-pay | Admitting: Internal Medicine

## 2008-10-16 ENCOUNTER — Telehealth: Payer: Self-pay | Admitting: Cardiology

## 2008-10-16 ENCOUNTER — Telehealth (INDEPENDENT_AMBULATORY_CARE_PROVIDER_SITE_OTHER): Payer: Self-pay | Admitting: *Deleted

## 2008-10-16 ENCOUNTER — Encounter: Payer: Self-pay | Admitting: Cardiology

## 2008-10-21 ENCOUNTER — Encounter: Payer: Self-pay | Admitting: Cardiovascular Disease

## 2008-10-21 ENCOUNTER — Telehealth: Payer: Self-pay | Admitting: Cardiology

## 2008-10-21 LAB — CONVERTED CEMR LAB
POC INR: 3.6
Prothrombin Time: 43.1 s

## 2008-10-28 ENCOUNTER — Encounter: Payer: Self-pay | Admitting: Internal Medicine

## 2008-11-04 ENCOUNTER — Encounter: Payer: Self-pay | Admitting: Cardiovascular Disease

## 2008-11-04 LAB — CONVERTED CEMR LAB: POC INR: 2.2

## 2008-11-12 ENCOUNTER — Encounter: Payer: Self-pay | Admitting: Cardiology

## 2008-11-18 ENCOUNTER — Encounter: Payer: Self-pay | Admitting: Cardiovascular Disease

## 2008-12-03 ENCOUNTER — Ambulatory Visit: Payer: Self-pay | Admitting: Cardiology

## 2008-12-03 ENCOUNTER — Encounter: Payer: Self-pay | Admitting: Internal Medicine

## 2008-12-03 ENCOUNTER — Ambulatory Visit: Payer: Self-pay | Admitting: Internal Medicine

## 2008-12-03 LAB — CONVERTED CEMR LAB: POC INR: 1.7

## 2008-12-15 ENCOUNTER — Ambulatory Visit: Payer: Self-pay | Admitting: Cardiology

## 2008-12-15 LAB — CONVERTED CEMR LAB: POC INR: 2.3

## 2008-12-16 ENCOUNTER — Encounter: Payer: Self-pay | Admitting: Cardiology

## 2008-12-22 ENCOUNTER — Encounter (INDEPENDENT_AMBULATORY_CARE_PROVIDER_SITE_OTHER): Payer: Self-pay | Admitting: *Deleted

## 2008-12-22 ENCOUNTER — Encounter: Payer: Self-pay | Admitting: Cardiology

## 2008-12-23 ENCOUNTER — Ambulatory Visit: Payer: Self-pay | Admitting: Cardiology

## 2008-12-23 DIAGNOSIS — E8770 Fluid overload, unspecified: Secondary | ICD-10-CM | POA: Insufficient documentation

## 2008-12-24 ENCOUNTER — Ambulatory Visit: Payer: Self-pay | Admitting: Internal Medicine

## 2008-12-30 ENCOUNTER — Ambulatory Visit: Payer: Self-pay | Admitting: Internal Medicine

## 2008-12-31 ENCOUNTER — Encounter (INDEPENDENT_AMBULATORY_CARE_PROVIDER_SITE_OTHER): Payer: Self-pay | Admitting: *Deleted

## 2008-12-31 LAB — CONVERTED CEMR LAB: Hgb A1c MFr Bld: 5.4 % (ref 4.6–6.5)

## 2009-01-08 ENCOUNTER — Ambulatory Visit: Payer: Self-pay | Admitting: Internal Medicine

## 2009-01-12 ENCOUNTER — Encounter: Payer: Self-pay | Admitting: Internal Medicine

## 2009-01-14 ENCOUNTER — Ambulatory Visit: Payer: Self-pay | Admitting: Cardiology

## 2009-01-14 LAB — CONVERTED CEMR LAB: POC INR: 1.5

## 2009-01-21 ENCOUNTER — Encounter: Payer: Self-pay | Admitting: Cardiology

## 2009-01-30 ENCOUNTER — Encounter (INDEPENDENT_AMBULATORY_CARE_PROVIDER_SITE_OTHER): Payer: Self-pay | Admitting: *Deleted

## 2009-02-01 ENCOUNTER — Encounter: Payer: Self-pay | Admitting: Cardiology

## 2009-02-02 ENCOUNTER — Encounter: Payer: Self-pay | Admitting: Internal Medicine

## 2009-02-02 ENCOUNTER — Ambulatory Visit: Payer: Self-pay | Admitting: Cardiology

## 2009-02-02 LAB — CONVERTED CEMR LAB: POC INR: 2

## 2009-02-04 ENCOUNTER — Encounter: Payer: Self-pay | Admitting: Internal Medicine

## 2009-02-17 ENCOUNTER — Ambulatory Visit: Payer: Self-pay | Admitting: Cardiology

## 2009-03-02 ENCOUNTER — Ambulatory Visit: Payer: Self-pay | Admitting: Cardiovascular Disease

## 2009-03-04 ENCOUNTER — Encounter: Payer: Self-pay | Admitting: Cardiology

## 2009-03-11 ENCOUNTER — Ambulatory Visit: Payer: Self-pay | Admitting: Internal Medicine

## 2009-03-11 DIAGNOSIS — E1165 Type 2 diabetes mellitus with hyperglycemia: Secondary | ICD-10-CM

## 2009-03-11 DIAGNOSIS — E1151 Type 2 diabetes mellitus with diabetic peripheral angiopathy without gangrene: Secondary | ICD-10-CM

## 2009-03-11 DIAGNOSIS — IMO0002 Reserved for concepts with insufficient information to code with codable children: Secondary | ICD-10-CM | POA: Insufficient documentation

## 2009-03-27 ENCOUNTER — Encounter: Payer: Self-pay | Admitting: Internal Medicine

## 2009-03-27 ENCOUNTER — Ambulatory Visit: Payer: Self-pay | Admitting: Cardiology

## 2009-04-06 ENCOUNTER — Telehealth: Payer: Self-pay | Admitting: Internal Medicine

## 2009-04-23 ENCOUNTER — Encounter (INDEPENDENT_AMBULATORY_CARE_PROVIDER_SITE_OTHER): Payer: Self-pay | Admitting: *Deleted

## 2009-04-27 ENCOUNTER — Ambulatory Visit: Payer: Self-pay | Admitting: Cardiovascular Disease

## 2009-04-27 ENCOUNTER — Ambulatory Visit: Payer: Self-pay | Admitting: Cardiology

## 2009-04-27 LAB — CONVERTED CEMR LAB: POC INR: 2.1

## 2009-05-11 ENCOUNTER — Encounter (INDEPENDENT_AMBULATORY_CARE_PROVIDER_SITE_OTHER): Payer: Self-pay | Admitting: *Deleted

## 2009-05-12 ENCOUNTER — Ambulatory Visit: Payer: Self-pay | Admitting: Internal Medicine

## 2009-05-25 ENCOUNTER — Telehealth: Payer: Self-pay | Admitting: Internal Medicine

## 2009-05-26 ENCOUNTER — Ambulatory Visit: Payer: Self-pay | Admitting: Cardiology

## 2009-05-26 ENCOUNTER — Encounter (INDEPENDENT_AMBULATORY_CARE_PROVIDER_SITE_OTHER): Payer: Self-pay | Admitting: Cardiology

## 2009-05-26 LAB — CONVERTED CEMR LAB
BUN: 20 mg/dL (ref 6–23)
Hgb A1c MFr Bld: 6.1 % (ref 4.6–6.5)
POC INR: 1.8
PSA: 0.28 ng/mL (ref 0.10–4.00)
TSH: 0.59 microintl units/mL (ref 0.35–5.50)

## 2009-06-03 ENCOUNTER — Encounter: Payer: Self-pay | Admitting: Internal Medicine

## 2009-06-10 DIAGNOSIS — Z8639 Personal history of other endocrine, nutritional and metabolic disease: Secondary | ICD-10-CM

## 2009-06-10 DIAGNOSIS — K5730 Diverticulosis of large intestine without perforation or abscess without bleeding: Secondary | ICD-10-CM | POA: Insufficient documentation

## 2009-06-10 DIAGNOSIS — Z862 Personal history of diseases of the blood and blood-forming organs and certain disorders involving the immune mechanism: Secondary | ICD-10-CM

## 2009-06-16 ENCOUNTER — Ambulatory Visit: Payer: Self-pay | Admitting: Internal Medicine

## 2009-06-17 ENCOUNTER — Ambulatory Visit: Payer: Self-pay | Admitting: Cardiovascular Disease

## 2009-06-17 ENCOUNTER — Ambulatory Visit: Payer: Self-pay | Admitting: Cardiology

## 2009-06-17 LAB — CONVERTED CEMR LAB: POC INR: 2.5

## 2009-07-15 ENCOUNTER — Ambulatory Visit: Payer: Self-pay | Admitting: Internal Medicine

## 2009-07-16 ENCOUNTER — Encounter: Payer: Self-pay | Admitting: Cardiology

## 2009-07-29 ENCOUNTER — Ambulatory Visit: Payer: Self-pay | Admitting: Internal Medicine

## 2009-07-30 ENCOUNTER — Encounter: Payer: Self-pay | Admitting: Internal Medicine

## 2009-08-04 ENCOUNTER — Ambulatory Visit: Payer: Self-pay | Admitting: Internal Medicine

## 2009-08-13 ENCOUNTER — Ambulatory Visit: Payer: Self-pay | Admitting: Cardiology

## 2009-08-24 ENCOUNTER — Ambulatory Visit: Payer: Self-pay | Admitting: Cardiology

## 2009-09-02 ENCOUNTER — Encounter: Payer: Self-pay | Admitting: Internal Medicine

## 2009-09-02 ENCOUNTER — Encounter: Payer: Self-pay | Admitting: Cardiology

## 2009-09-11 ENCOUNTER — Ambulatory Visit (HOSPITAL_COMMUNITY): Admission: RE | Admit: 2009-09-11 | Discharge: 2009-09-11 | Payer: Self-pay | Admitting: Neurological Surgery

## 2009-09-28 ENCOUNTER — Telehealth (INDEPENDENT_AMBULATORY_CARE_PROVIDER_SITE_OTHER): Payer: Self-pay | Admitting: *Deleted

## 2009-09-28 ENCOUNTER — Ambulatory Visit: Payer: Self-pay | Admitting: Internal Medicine

## 2009-09-28 LAB — CONVERTED CEMR LAB: POC INR: 2.4

## 2009-11-03 ENCOUNTER — Ambulatory Visit: Payer: Self-pay | Admitting: Cardiology

## 2009-11-05 ENCOUNTER — Ambulatory Visit: Payer: Self-pay | Admitting: Internal Medicine

## 2009-11-16 ENCOUNTER — Encounter: Payer: Self-pay | Admitting: Internal Medicine

## 2009-11-30 ENCOUNTER — Encounter: Payer: Self-pay | Admitting: Cardiology

## 2009-12-01 ENCOUNTER — Ambulatory Visit: Payer: Self-pay | Admitting: Cardiovascular Disease

## 2009-12-02 ENCOUNTER — Encounter: Payer: Self-pay | Admitting: Internal Medicine

## 2009-12-10 ENCOUNTER — Ambulatory Visit: Payer: Self-pay | Admitting: Internal Medicine

## 2009-12-11 ENCOUNTER — Ambulatory Visit: Payer: Self-pay | Admitting: Internal Medicine

## 2009-12-29 ENCOUNTER — Encounter: Payer: Self-pay | Admitting: Cardiology

## 2009-12-30 ENCOUNTER — Ambulatory Visit: Payer: Self-pay | Admitting: Vascular Surgery

## 2009-12-30 ENCOUNTER — Ambulatory Visit: Payer: Self-pay | Admitting: Cardiology

## 2009-12-30 LAB — CONVERTED CEMR LAB: POC INR: 2.5

## 2010-01-01 ENCOUNTER — Ambulatory Visit: Payer: Self-pay | Admitting: Internal Medicine

## 2010-01-07 ENCOUNTER — Ambulatory Visit: Payer: Self-pay | Admitting: Internal Medicine

## 2010-01-07 DIAGNOSIS — E1142 Type 2 diabetes mellitus with diabetic polyneuropathy: Secondary | ICD-10-CM

## 2010-01-07 DIAGNOSIS — E559 Vitamin D deficiency, unspecified: Secondary | ICD-10-CM | POA: Insufficient documentation

## 2010-01-07 DIAGNOSIS — D692 Other nonthrombocytopenic purpura: Secondary | ICD-10-CM | POA: Insufficient documentation

## 2010-01-11 LAB — CONVERTED CEMR LAB
ALT: 11 units/L (ref 0–53)
BUN: 17 mg/dL (ref 6–23)
Basophils Absolute: 0.1 10*3/uL (ref 0.0–0.1)
Bilirubin, Direct: 0.1 mg/dL (ref 0.0–0.3)
Calcium: 8.8 mg/dL (ref 8.4–10.5)
Cholesterol: 145 mg/dL (ref 0–200)
Eosinophils Absolute: 0.1 10*3/uL (ref 0.0–0.7)
GFR calc non Af Amer: 64.48 mL/min (ref >60)
Glucose, Bld: 100 mg/dL — ABNORMAL HIGH (ref 70–99)
Hgb A1c MFr Bld: 6.3 % (ref 4.6–6.5)
Lymphocytes Relative: 10.1 % — ABNORMAL LOW (ref 12.0–46.0)
MCHC: 34 g/dL (ref 30.0–36.0)
Microalb, Ur: 2.2 mg/dL — ABNORMAL HIGH (ref 0.0–1.9)
Neutrophils Relative %: 82.5 % — ABNORMAL HIGH (ref 43.0–77.0)
RBC: 4.31 M/uL (ref 4.22–5.81)
RDW: 15 % — ABNORMAL HIGH (ref 11.5–14.6)
TSH: 0.64 microintl units/mL (ref 0.35–5.50)
Total Protein: 6.3 g/dL (ref 6.0–8.3)
VLDL: 20.4 mg/dL (ref 0.0–40.0)

## 2010-01-27 ENCOUNTER — Encounter: Payer: Self-pay | Admitting: Internal Medicine

## 2010-01-27 ENCOUNTER — Ambulatory Visit: Payer: Self-pay | Admitting: Cardiovascular Disease

## 2010-01-27 ENCOUNTER — Encounter: Payer: Self-pay | Admitting: Cardiology

## 2010-01-27 LAB — CONVERTED CEMR LAB: POC INR: 3.1

## 2010-02-02 ENCOUNTER — Telehealth: Payer: Self-pay | Admitting: Internal Medicine

## 2010-02-04 ENCOUNTER — Ambulatory Visit: Payer: Self-pay | Admitting: Internal Medicine

## 2010-02-08 ENCOUNTER — Telehealth: Payer: Self-pay | Admitting: Cardiology

## 2010-02-09 ENCOUNTER — Ambulatory Visit: Payer: Self-pay | Admitting: Cardiology

## 2010-02-10 LAB — CONVERTED CEMR LAB
GFR calc non Af Amer: 52.57 mL/min (ref >60)
Potassium: 4.6 meq/L (ref 3.5–5.1)
Sodium: 142 meq/L (ref 135–145)

## 2010-02-12 ENCOUNTER — Ambulatory Visit: Payer: Self-pay | Admitting: Cardiology

## 2010-02-12 ENCOUNTER — Inpatient Hospital Stay (HOSPITAL_COMMUNITY)
Admission: EM | Admit: 2010-02-12 | Discharge: 2010-02-18 | Payer: Self-pay | Source: Home / Self Care | Admitting: Cardiology

## 2010-02-12 ENCOUNTER — Ambulatory Visit: Payer: Self-pay | Admitting: Pulmonary Disease

## 2010-02-12 ENCOUNTER — Encounter: Payer: Self-pay | Admitting: Cardiology

## 2010-02-15 ENCOUNTER — Ambulatory Visit: Payer: Self-pay | Admitting: Vascular Surgery

## 2010-02-15 ENCOUNTER — Encounter: Payer: Self-pay | Admitting: Cardiology

## 2010-02-15 ENCOUNTER — Telehealth (INDEPENDENT_AMBULATORY_CARE_PROVIDER_SITE_OTHER): Payer: Self-pay | Admitting: *Deleted

## 2010-02-17 ENCOUNTER — Encounter: Payer: Self-pay | Admitting: Cardiology

## 2010-02-22 ENCOUNTER — Ambulatory Visit: Payer: Self-pay | Admitting: Internal Medicine

## 2010-02-22 ENCOUNTER — Ambulatory Visit: Payer: Self-pay | Admitting: Cardiology

## 2010-02-22 LAB — CONVERTED CEMR LAB: POC INR: 2

## 2010-02-23 ENCOUNTER — Telehealth: Payer: Self-pay | Admitting: Cardiology

## 2010-02-23 LAB — CONVERTED CEMR LAB
Chloride: 110 meq/L (ref 96–112)
Potassium: 5 meq/L (ref 3.5–5.1)
Sodium: 144 meq/L (ref 135–145)

## 2010-03-02 ENCOUNTER — Encounter: Payer: Self-pay | Admitting: Internal Medicine

## 2010-03-03 ENCOUNTER — Encounter: Payer: Self-pay | Admitting: Cardiology

## 2010-03-04 ENCOUNTER — Ambulatory Visit: Payer: Self-pay | Admitting: Internal Medicine

## 2010-03-04 ENCOUNTER — Encounter: Payer: Self-pay | Admitting: Cardiology

## 2010-03-04 ENCOUNTER — Ambulatory Visit: Payer: Self-pay | Admitting: Cardiology

## 2010-03-09 ENCOUNTER — Telehealth: Payer: Self-pay | Admitting: Cardiology

## 2010-03-10 ENCOUNTER — Telehealth: Payer: Self-pay | Admitting: Cardiology

## 2010-03-11 ENCOUNTER — Telehealth: Payer: Self-pay | Admitting: Cardiology

## 2010-03-17 ENCOUNTER — Ambulatory Visit: Payer: Self-pay | Admitting: Cardiology

## 2010-04-07 ENCOUNTER — Ambulatory Visit
Admission: RE | Admit: 2010-04-07 | Discharge: 2010-04-07 | Payer: Self-pay | Source: Home / Self Care | Attending: Cardiology | Admitting: Cardiology

## 2010-04-07 ENCOUNTER — Ambulatory Visit: Admission: RE | Admit: 2010-04-07 | Discharge: 2010-04-07 | Payer: Self-pay | Source: Home / Self Care

## 2010-04-07 LAB — CONVERTED CEMR LAB: POC INR: 1.8

## 2010-04-13 ENCOUNTER — Ambulatory Visit (HOSPITAL_COMMUNITY)
Admission: EM | Admit: 2010-04-13 | Discharge: 2010-04-13 | Payer: Self-pay | Source: Home / Self Care | Attending: General Surgery | Admitting: General Surgery

## 2010-04-17 LAB — PROTIME-INR: Prothrombin Time: 26.5 seconds — ABNORMAL HIGH (ref 11.6–15.2)

## 2010-04-17 LAB — POCT I-STAT 4, (NA,K, GLUC, HGB,HCT)
Glucose, Bld: 117 mg/dL — ABNORMAL HIGH (ref 70–99)
HCT: 45 % (ref 39.0–52.0)

## 2010-04-17 LAB — DIFFERENTIAL
Basophils Absolute: 0 10*3/uL (ref 0.0–0.1)
Basophils Relative: 0 % (ref 0–1)
Eosinophils Absolute: 0 10*3/uL (ref 0.0–0.7)
Eosinophils Relative: 0 % (ref 0–5)
Neutrophils Relative %: 86 % — ABNORMAL HIGH (ref 43–77)

## 2010-04-17 LAB — CBC
MCV: 89.2 fL (ref 78.0–100.0)
Platelets: 218 10*3/uL (ref 150–400)
RBC: 4.72 MIL/uL (ref 4.22–5.81)
RDW: 15.6 % — ABNORMAL HIGH (ref 11.5–15.5)
WBC: 13.8 10*3/uL — ABNORMAL HIGH (ref 4.0–10.5)

## 2010-04-17 LAB — BASIC METABOLIC PANEL
BUN: 32 mg/dL — ABNORMAL HIGH (ref 6–23)
Chloride: 105 mEq/L (ref 96–112)
GFR calc Af Amer: >60 mL/min (ref >60)
GFR calc non Af Amer: 54 mL/min — ABNORMAL LOW (ref >60)
Potassium: 4.5 mEq/L (ref 3.5–5.1)

## 2010-04-17 LAB — TYPE AND SCREEN: Antibody Screen: NEGATIVE

## 2010-04-20 NOTE — Medication Information (Signed)
Summary: rov/sp  Anticoagulant Therapy  Managed by: Bethena Midget, RN, BSN Referring MD: [name] MD PCP: [name] MD Supervising MD: Antoine Poche MD, Fayrene Fearing Indication 1: Atrial Fibrillation (ICD-427.31) Lab Used: [facility] Site: [facility] INR POC 2.6 INR RANGE 2 - 3  Dietary changes: no    Health status changes: no    Bleeding/hemorrhagic complications: no    Recent/future hospitalizations: no    Any changes in medication regimen? no    Recent/future dental: no  Any missed doses?: no       Is patient compliant with meds? yes       Allergies: 1)  ! Pcn  Anticoagulation Management History:      The patient is taking warfarin and comes in today for a routine follow up visit.  Positive risk factors for bleeding include an age of 69 years or older and presence of serious comorbidities.  Negative risk factors for bleeding include no history of CVA/TIA.  The bleeding index is 'intermediate risk'.  Positive CHADS2 values include History of CHF, History of HTN, and History of Diabetes.  Negative CHADS2 values include Age > 36 years old and Prior Stroke/CVA/TIA.  The start date was 02/15/2002.  His last INR was 1.6 RATIO.  Anticoagulation responsible provider: Antoine Poche MD, Fayrene Fearing.  INR POC: 2.6.  Cuvette Lot#: [id number].  Exp: 08/12.    Anticoagulation Management Assessment/Plan:      The patient's current anticoagulation dose is Warfarin sodium 2.5 mg tabs: Use as directed by Anticoagualtion Clinic.  The target INR is 2 - 3.  The next INR is due 09/28/2009.  Anticoagulation instructions were given to patient.  Results were reviewed/authorized by Bethena Midget, RN, BSN.  He was notified by Bethena Midget, RN, BSN.         Prior Anticoagulation Instructions: INR 1.5  Take 2 tablets today and tomorrow then resume same dose of 1 tablet every day except 2 tablets on Wednesday and Saturday  Current Anticoagulation Instructions: INR 2.6 Continue 2.5mg s daily except 5mg s on  Wednesdays and Saturdays. Recheck in 4 weeks.

## 2010-04-20 NOTE — Letter (Signed)
Summary: [facility]  [facility]   Imported By: Lanelle Bal 02/08/2010 13:33:45  _____________________________________________________________________  External Attachment:    Type:   Image     Comment:   External Document

## 2010-04-20 NOTE — Letter (Signed)
Summary: [facility]  [facility]   Imported By: Lanelle Bal 09/22/2009 08:25:21  _____________________________________________________________________  External Attachment:    Type:   Image     Comment:   External Document

## 2010-04-20 NOTE — Progress Notes (Signed)
Summary: Refill Request  Phone Note Refill Request Call back at [phone] Message from:  Pharmacy on February 15, 2010 8:52 AM  Refills Requested: Medication #1:  GABAPENTIN 100 MG CAPS 1 every 8 hrs as needed for leg pain   Dosage confirmed as above?Dosage Confirmed   Supply Requested: 1 month   Last Refilled: 01/18/2010 [facility] on [address]   Next Appointment Scheduled: none Initial call taken by: Harold Barban,  February 15, 2010 8:52 AM    Prescriptions: GABAPENTIN 100 MG CAPS (GABAPENTIN) 1 every 8 hrs as needed for leg pain  #30 x 3   Entered by:   Shonna Chock CMA   Authorized by:   [name] MD   Signed by:   Shonna Chock CMA on 02/15/2010   Method used:   Electronically to        [facility] (retail)       [address]       [place], [place]  [address]       Ph: [phone]       Fax: [phone]   RxID:   [id number]

## 2010-04-20 NOTE — Letter (Signed)
Summary: [facility] Office Visit   [facility] Office Visit   Imported By: Roderic Ovens 01/12/2010 16:16:55  _____________________________________________________________________  External Attachment:    Type:   Image     Comment:   External Document

## 2010-04-20 NOTE — Letter (Signed)
Summary: [facility]   [facility]   Imported By: Roderic Ovens 02/25/2010 16:21:05  _____________________________________________________________________  External Attachment:    Type:   Image     Comment:   External Document

## 2010-04-20 NOTE — Miscellaneous (Signed)
Summary: [facility]  [facility]   Imported By: Kassie Mends 03/24/2009 10:02:15  _____________________________________________________________________  External Attachment:    Type:   Image     Comment:   External Document

## 2010-04-20 NOTE — Letter (Signed)
Summary: Patient Notice- Polyp Results  [facility]  [address] [place], [place] [address]   Phone: [phone]  Fax: [phone]        Jul 30, 2009 MRN: [id number]    DOREN KASPAR [address] [place], [place]  [address]    Dear Mr. KOZMA,  I am pleased to inform you that the colon polyp(s) removed during your recent colonoscopy was (were) found to be benign (no cancer detected) upon pathologic examination.Both polyps were  precancerous ( one was adenoma and the other serrated adenoma)  I recommend you have a repeat colonoscopy examination in 5_ years to look for recurrent polyps, as having colon polyps increases your risk for having recurrent polyps or even colon cancer in the future.Normally we would recommend a 3 year interval for this type of polyput because Your anticoagulation we will plan for a 5 year interval.  Should you develop new or worsening symptoms of abdominal pain, bowel habit changes or bleeding from the rectum or bowels, please schedule an evaluation with either your primary care physician or with me.  Additional information/recommendations:  _x_ No further action with gastroenterology is needed at this time. Please      follow-up with your primary care physician for your other healthcare      needs.  __ Please call [phone] to schedule a return visit to review your      situation.  __ Please keep your follow-up visit as already scheduled.  __ Continue treatment plan as outlined the day of your exam.  Please call us if you are having persistent problems or have questions about your condition that have not been fully answered at this time.  Sincerely,  Hart Carwin MD  This letter has been electronically signed by your physician.

## 2010-04-20 NOTE — Op Note (Signed)
Summary: Gastroenterology  [facility]  MR#:  [id number] Page #  [facility]   GASTROENTEROLOGY OFFICE NOTE  NAME:  [name], [name]   OFFICE NO:  [id number]  DATE:  10/11/01  SUBJECTIVE:  The patient is a delightful 70 year old gentleman who has symptomatic hemorrhoids.  He has no family history of colon cancer.  His bowel habits are regular, having one bowel movement a day.  He gets occasional irritation with hemorrhoids but denies any rectal bleeding.  He is here to discuss colorectal screening.  OBJECTIVE:  Blood pressure 142/86; pulse 76; weight 256 pounds.  He was mildly overweight; alert and oriented.  Lungs were clear to auscultation.  COR with normal S1; normal S2.  Abdomen was soft and nontender with normoactive bowel sounds.  The liver edge was at the costal margin.  There were no scars.  IMPRESSION:   59.  A 70 year old gentleman with intermittent rectal irritation attributed to hemorrhoids 2.  No risk factors for colon cancer other than his age of 35  PLAN:  Screening colonoscopy has been scheduled for October 26, 2001 at 3:00 p.m. in our office.  Samples of Analpram are given to the patient to use during the prep for colonoscopy to prevent significant rectal irritation.      Hedwig Morton. Juanda Chance, M.D.  [id number] cc:  Titus Dubin. Alwyn Ren, M.D., F.C.C.P.  D:  10/12/01; T:  ; Job [phone]

## 2010-04-20 NOTE — Assessment & Plan Note (Signed)
Summary: rov  Medications Added FUROSEMIDE 40 MG  TABS (FUROSEMIDE) 2 tabs two times a day AMLODIPINE BESYLATE 5 MG TABS (AMLODIPINE BESYLATE) Take 1 tab at supper (HOLD) METOLAZONE 2.5 MG TABS (METOLAZONE) Take one tablet by mouth daily as directed      Allergies Added:   Visit Type:  Follow-up Referring Provider:  Barnett Abu, MD  Malon Kindle, MD  Richard Ramos,MD  [facility] Primary Provider:  [name] MD  CC:  shortness of breath.  History of Present Illness: The patient returns today for followup of his multiple cardiac problems.  In addition he is worse today.  It appears that he has become volume overloaded.  This is new recently.  He has swelling in his right leg.  He has shortness of breath with any exertion.  He does not have PND orthopnea.  He has not been having chest pain.  His underlying rhythm appears to be atrial fib the majority of the time.  Current Medications (verified): 1)  Adult Aspirin Ec Low Strength 81 Mg  Tbec (Aspirin) .Marland Kitchen.. 1 By Mouth Q Week 2)  Warfarin Sodium 2.5 Mg Tabs (Warfarin Sodium) .... Use As Directed By Anticoagualtion Clinic 3)  Metformin Hcl 500 Mg  Tabs (Metformin Hcl) .Marland Kitchen.. 1qd Wih Largest Meal 4)  Klor-Con M20 20 Meq Cr-Tabs (Potassium Chloride Crys Cr) .... Take One Tablet Daily 5)  Furosemide 40 Mg  Tabs (Furosemide) .... Take 1 Tablet By [facility] Times A Day 6)  Benazepril Hcl 10 Mg Tabs (Benazepril Hcl) .... Take 1/2  Tablet By Mouth Once A Day 7)  Accu-Chek Comfort Curve   Strp (Glucose Blood) .... Test M,w,f,sun and Check Blood Sugars 2 Hours After Meal 8)  Accu-Chek Soft Touch Lancets   Misc (Lancets) .... Use As Directed 9)  Simvastatin 40 Mg Tabs (Simvastatin) .Marland Kitchen.. 1 At Bedtime, Appointment Due 10)  Glimepiride 2 Mg Tabs (Glimepiride) .... 1/2 Once Daily 11)  Carvedilol 25 Mg Tabs (Carvedilol) .... Take 1 Tab By Mouth Every Am and  2 Tabs Every Pm 12)  Nitrostat 0.4 Mg Subl (Nitroglycerin) .Marland Kitchen.. 1  Tablet Under Tongue At Onset of Chest Pain; You May Repeat Every 5 Minutes For Up To 3 Doses. 13)  Amlodipine Besylate 5 Mg Tabs (Amlodipine Besylate) .... Take 1 Tab At Supper 14)  Indomethacin 25 Mg Caps (Indomethacin) .... Take 1 Capsule By Mouth Three Times A Day As Needed 15)  Methocarbamol 500 Mg Tabs (Methocarbamol) .Marland Kitchen.. 1-3 Times Daily 16)  Gabapentin 100 Mg Caps (Gabapentin) .Marland Kitchen.. 1 Every 8 Hrs As Needed For Leg Pain  Allergies (verified): 1)  ! Pcn  Past History:  Past Medical History: Diabetes mellitus, type II Hyperlipidemia Hypertension penicillin allergy  pilonidal cyst Atrial fibrillation.... long-term high-dose amiodarone therapy multiple cardioversions.Marland Kitchen amiodarone stopped September 2009 Atrial flutter present... February 02, 2009 Left atrial clot but cardioversions done since that time. Claudication significant peripheral disease in the legs and question mesenteric disease Low back pain from spinal stenosis, DDD....surgery... Dr. Danielle Dess CAD  .Marland KitchenMarland KitchenCatheterization July 2008 vein grafts and LIMA patent..... 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 Georganna Skeans infected leg  after bypass treated Nasal drainage  ...chronic Skin discoloration from amiodarone and easy bruisability from Coumadin and aspirin TSH.... borderline low Excess beer consumption.... but not a true alcohol problem Renal insufficiency creatinine 1.2-1.7 range ICD placement 2009 by Dr. Ladona Ridgel also biventricular pacing.... flutter could not be ablated as it is left  sided. Chronotropic incompetence pacer rate adjusted June 09 with improvement. 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 Shortness of breath    worsening recently.... November, 2011        Review of Systems       Patient denies fever, chills, headache, sweats, rash, change in vision, change in hearing, chest pain, nausea vomiting, urinary symptoms.  All of the systems are reviewed and are negative.  Vital Signs:  Patient profile:   70 year old male Height:      72 inches Weight:      247 pounds BMI:     33.62 Pulse rate:   70 / minute BP sitting:   96 / 60  (left arm) Cuff size:   regular  Vitals Entered By: Hardin Negus, RMA (February 09, 2010 11:04 AM)  Physical Exam  General:  patient is stable but disappointed with the shortness of breath. Head:  head is atraumatic. Eyes:  no xanthelasma. Neck:  no jugular venous distention. Chest Wall:  no chest wall tenderness. Lungs:  there are decreased breath sounds at the left lung base.  There is no respiratory distress. Heart:  cardiac exam reveals S1-S2.  No clicks or significant murmurs. Abdomen:  abdomen is protuberant. Msk:  no musculoskeletal deformities. Extremities:  1+ peripheral edema in the right lower extremity. Skin:  old graying of the skin from amiodarone Psych:  patient is oriented to person time and place.  Affect is normal.  He is here with his wife today.    ICD Specifications Following MD:  [name], MD     Referring MD:  KATZ ICD Vendor:  [employer]     ICD Model Number:  [phone]     ICD Serial Number:  [id number] ICD DOI:  04/11/2007     ICD Implanting MD:  [name], MD  Lead 1:    Location: RA     DOI: 04/11/2007     Model #: [id number]     Serial #: [id number]     Status: active Lead 2:    Location: RV     DOI: 04/11/2007     Model #: [id number]     Serial #: [id number]     Status: active Lead 3:    Location: LV     DOI:  04/11/2007     Model #: [id number]     Serial #: [id number]     Status: active  Indications::  ICM, CHF   ICD Follow Up ICD Dependent:  No       ICD Device Measurements Configuration: LV TIP TO RV COIL  Episodes Coumadin:  Yes  Brady Parameters Mode DDDR     Lower Rate Limit:  60     Upper Rate Limit 130 PAV 200     Sensed AV Delay:  180  Tachy Zones VF:  200     VT:  250 FVT VIA VF     VT1:  176     Impression & Recommendations:  Problem # 1:  ATRIAL FIBRILLATION (ICD-427.31) Is my understanding from his recent ICD interrogation that his underlying rhythm is atrial fibrillation all the time.  Because he is feeling worse, we will need to reassess once again what else can be done about his atrial fib.  Problem # 2:  CORONARY ARTERY DISEASE (ICD-414.00) Coronary disease is stable.  I do not think that his current exertional shortness of breath his ischemic but this will be reviewed.  Problem # 3:  HYPERTENSION, ESSENTIAL NOS (ICD-401.9)  His updated medication list for this problem includes:    Adult Aspirin Ec Low Strength 81 Mg Tbec (Aspirin) .Marland Kitchen... 1 by mouth q week    Furosemide 40 Mg Tabs (Furosemide) .Marland Kitchen... 2 tabs two times a day    Benazepril Hcl 10 Mg Tabs (Benazepril hcl) .Marland Kitchen... Take 1/2  tablet by mouth once a day    Carvedilol 25 Mg Tabs (Carvedilol) .Marland Kitchen... Take 1 tab by mouth every am and  2 tabs every pm    Amlodipine Besylate 5 Mg Tabs (Amlodipine besylate) .Marland Kitchen... Take 1 tab at supper (hold)    Metolazone 2.5 Mg Tabs (Metolazone) .Marland Kitchen... Take one tablet by mouth daily as directed blood pressure is actually on the low side for him.  I will put his amlodipine on hold for now.  Problem # 4:  FLUID OVERLOAD (ICD-276.6)  The patient appears fluid overloaded.  I cannot explain why he has edema in the right leg and not the left.  I do not think he has deep venous thrombosis.  He has decreased breath sounds in his left lung base.  Chest x-ray will be done.  Chemistry lab be obtained.   His Lasix dose will be increased to 80 p.o. b.i.d. and he'll receive a dose of Zaroxolyn this afternoon with his afternoon dose of Lasix.  I will see him back in the office on Friday for further assessment.  Orders: TLB-BMP (Basic Metabolic Panel-BMET) (80048-METABOL) T-2 View CXR (71020TC)  Problem # 5:  CHRONIC KIDNEY DISEASE STAGE II (MILD) (ICD-585.2) Chemistry lab recheck today.  Problem # 6:  BACK PAIN, LUMBAR (ICD-724.2) The patient continues to have back pain.  He has seen multiple specialists.  He was seen by neurology most recently.  He will now return to see Dr. Ilean Skill.  If he needs an injection of any type he should be stable for this after we stabilize his volume and renal status.  Patient Instructions: 1)  Labs today 2)  A chest x-ray takes a picture of the organs and structures inside the chest, including the heart, lungs, and blood vessels. This test can show several things, including, whether the heart is enlarged; whether fluid is building up in the lungs; and whether pacemaker / defibrillator leads are still in place. 3)  Change Lasix to 80mg  two times a day (2 tabs two times a day) 4)  Take Zaroxolyn 2.5mg  with afternoon dose of Lasix today 5)  Hold Amlodipine 6)  Decrease water intake at bedtime. 7)  Follow up in Friday 11/25 at 11:15 Prescriptions: METOLAZONE 2.5 MG TABS (METOLAZONE) Take one tablet by mouth daily as directed  #10 x 3   Entered by:   Meredith Staggers, RN   Authorized by:   Talitha Givens, MD, [facility]   Signed by:   Meredith Staggers, RN on 02/09/2010   Method used:   Electronically to        [facility] (retail)       [address]       [place], [place]  [address]  Ph: [phone]       Fax: [phone]   RxID:   [id number]

## 2010-04-20 NOTE — Letter (Signed)
Summary: New Patient letter  [facility]  [address] [place], [place] [address]   Phone: [phone]  Fax: [phone]       04/23/2009 MRN: [id number]  [name] [address] [place], [place]  [address]  Dear [name],  Welcome to the Gastroenterology Division at [facility].    You are scheduled to see Dr.  Lina Sar on June 16, 2009 at 9:15am on the 3rd floor at [facility], [address]  We ask that you try to arrive at our office 15 minutes prior to your appointment time to allow for check-in.  We would like you to complete the enclosed self-administered evaluation form prior to your visit and bring it with you on the day of your appointment.  We will review it with you.  Also, please bring a complete list of all your medications or, if you prefer, bring the medication bottles and we will list them.  Please bring your insurance card so that we may make a copy of it.  If your insurance requires a referral to see a specialist, please bring your referral form from your primary care physician.  Co-payments are due at the time of your visit and may be paid by cash, check or credit card.     Your office visit will consist of a consult with your physician (includes a physical exam), any laboratory testing he/she may order, scheduling of any necessary diagnostic testing (e.g. x-ray, ultrasound, CT-scan), and scheduling of a procedure (e.g. Endoscopy, Colonoscopy) if required.  Please allow enough time on your schedule to allow for any/all of these possibilities.    If you cannot keep your appointment, please call [phone] to cancel or reschedule prior to your appointment date.  This allows [place] the opportunity to schedule an appointment for another patient in need of care.  If you do not cancel or reschedule by 5 p.m. the business day prior to your appointment date, you will be charged a $50.00 late cancellation/no-show fee.    Thank you for  choosing [facility] for your medical needs.  We appreciate the opportunity to care for you.  Please visit [place] at our website  to learn more about our practice.                     Sincerely,                                                             The Gastroenterology Division

## 2010-04-20 NOTE — Assessment & Plan Note (Signed)
Summary: consult before col pt on coumadin...em    History of Present Illness Visit Type: new patient  Primary GI MD: Lina Sar MD Primary Provider: [name] MD Requesting Provider: n/a Chief Complaint: Consult colon. Pt c/o chest pain  History of Present Illness:   This is a 70 year old white male who is here to discuss having a recall colonoscopy. He had a hyperplastic polyp on a colonoscopy in August 2003. He also had symptomatic hemorrhoids. A CT Scan of the abdomen in 2004 showed an abdominal aortic aneurysm which was subsequently repaired in April 2004. He had back surgery last year as well. He has been on chronic Coumadin for atrial fibrillation. He has gone off Coumadin for his surgeries.   GI Review of Systems    Reports chest pain.      Denies abdominal pain, acid reflux, belching, bloating, dysphagia with liquids, dysphagia with solids, heartburn, loss of appetite, nausea, vomiting, vomiting blood, weight loss, and  weight gain.        Denies anal fissure, black tarry stools, change in bowel habit, constipation, diarrhea, diverticulosis, fecal incontinence, heme positive stool, hemorrhoids, irritable bowel syndrome, jaundice, light color stool, liver problems, rectal bleeding, and  rectal pain.    Current Medications (verified): 1)  Adult Aspirin Ec Low Strength 81 Mg  Tbec (Aspirin) .Marland Kitchen.. 1 By Mouth Q Week 2)  Warfarin Sodium 2.5 Mg Tabs (Warfarin Sodium) .... Use As Directed By [facility] 3)  Metformin Hcl 500 Mg  Tabs (Metformin Hcl) .Marland Kitchen.. 1qd Wih Largest Meal 4)  Klor-Con M10 10 Meq Cr-Tabs (Potassium Chloride Crys Cr) .... Take 1 Tablet By Mouth Two Times A Day 5)  Furosemide 40 Mg  Tabs (Furosemide) .... Take 1 Tablet By [facility] Times A Day 6)  Benazepril Hcl 10 Mg Tabs (Benazepril Hcl) .... Take 1/2  Tablet By Mouth Once A Day 7)  Accu-Chek Comfort Curve   Strp (Glucose Blood) .... Test M,w,f,sun and Check Blood Sugars 2 Hours After Meal 8)   Accu-Chek Soft Touch Lancets   Misc (Lancets) .... Use As Directed 9)  Simvastatin 40 Mg Tabs (Simvastatin) .Marland Kitchen.. 1 At Bedtime, Appointment Due 10)  Glimepiride 2 Mg Tabs (Glimepiride) .... 1/2 Once Daily 11)  Carvedilol 25 Mg Tabs (Carvedilol) .... Take 1 Tab By Mouth Every Am and  2 Tabs Every Pm 12)  Nitrostat 0.4 Mg Subl (Nitroglycerin) .Marland Kitchen.. 1 Tablet Under Tongue At Onset of Chest Pain; You May Repeat Every 5 Minutes For Up To 3 Doses. 13)  Amlodipine Besylate 5 Mg Tabs (Amlodipine Besylate) .... Take 1/2 Tab Daily For 1 Week Then Increase To 1 Tab At Bedtime  Allergies (verified): 1)  ! Pcn  Past History:  Past Medical History: Reviewed history from 03/11/2009 and no changes required. Diabetes mellitus, type II Hyperlipidemia Hypertension penicillin allergy  pilonidal cyst Atrial fibrillation.... long-term high-dose amiodarone therapy multiple cardioversions.Marland Kitchen amiodarone stopped September 2009 Atrial flutter present... February 02, 2009 Left atrial clot but cardioversions done since that time. Claudication significant peripheral disease in the legs and question mesenteric disease Low back pain from spinal stenosis, DDD....surgery... Dr. Danielle Dess CAD  .Marland KitchenMarland KitchenCatheterization July 2008 vein grafts and LIMA patent..... 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 Georganna Skeans infected leg after bypass treated Nasal drainage  ...chronic Skin discoloration from amiodarone and easy bruisability from Coumadin and aspirin TSH.... borderline low Excess beer  consumption.... but not a true alcohol problem Renal insufficiency creatinine 1.2-1.7 range ICD placement 2009 by Dr. Ladona Ridgel also biventricular pacing.... flutter could not be ablated as it is left sided. Chronotropic incompetence pacer rate adjusted June 09 with improvement. 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           Past Surgical History: Reviewed history from 06/10/2009 and no changes required. Colon polypectomy 2004 ,Dr Juanda Chance (due 05/2008) Abdominal aortic aneurysm repair 2004 Coronary artery bypass graft 2004 Cervical X stops Defibrillator 01/09 Lumbar fusion with rods , screws 10/02/2008 , Dr Danielle Dess pilonidal cyst removal Surgery on the scrotum for varicose veins as a child.  Family History: Reviewed history from 06/10/2009 and no changes required. No strong family history of coronary disease. Mother: CVA, HTN Family History of Diabetes: Brother, Father No FH of Colon Cancer:  Social History: Married Retired Drinks approximately 6 beers daily. Former Smoker: quit 1994 Illicit Drug Use - no Drug Use:  no  Review of Systems       The patient complains of back pain and hearing problems.  The patient denies allergy/sinus, anemia, anxiety-new, arthritis/joint pain, blood in urine, breast changes/lumps, change in vision, confusion, cough, coughing up blood, depression-new, fainting, fatigue, fever, headaches-new, heart murmur, heart rhythm changes, itching, muscle pains/cramps, night sweats, nosebleeds, shortness of breath, skin rash, sleeping problems, sore throat, swelling of feet/legs, swollen lymph glands, thirst - excessive, urination - excessive, urination changes/pain, urine leakage, vision changes, and voice change.         Pertinent positive and negative review of systems were noted in the above HPI. All other ROS was otherwise negative.   Vital Signs:  Patient profile:   70  year old male Height:      72 inches Weight:      240 pounds BMI:     32.67 BSA:     2.30 Pulse rate:   76 / minute Pulse rhythm:   regular BP sitting:   136 / 84  (left arm) Cuff size:   regular  Vitals Entered By: Ok Anis CMA (June 16, 2009 9:06 AM)   Impression & Recommendations:  Problem # 1:  * CLEARANCE FOR COLONOSCOPY It is okay to proceed with a colonoscopy off Coumadin. I have discussed stopping Coumadin with the patient and his wife. He will have a routine MiraLax prep. Patient does not require SBE prophylaxis. The Coumadin will be restarted following colonoscopy depending on the findings.  Problem # 2:  DIABETES MELLITUS, CONTROLLED (ICD-250.00) Assessment: Comment Only  Problem # 3:  AUTOMATIC IMPLANTABLE CARDIAC DEFIBRILLATOR SITU (ICD-V45.02) Assessment: Comment Only  Other Orders: Colonoscopy (Colon)  Patient Instructions: 1)  Patient is to have a colonoscopy off Coumadin. Conscious sedation, prep and the procedure were explained to the patient and his wife. 2)  Copy sent to : Dr Myrtis Ser, Dr Lacretia Nicks.Hopper 3)  The medication list was reviewed and reconciled.  All changed / newly prescribed medications were explained.  A complete medication list was provided to the patient / caregiver. Prescriptions: DULCOLAX 5 MG  TBEC (BISACODYL) Day before procedure take 2 at 3pm and 2 at 8pm.  #4 x 0   Entered by:   [name] CMA (  AAMA)   Authorized by:   Hart Carwin MD   Signed by:   [name] CMA (AAMA) on 06/16/2009   Method used:   Electronically to        [facility] (retail)       [address]       [place], [place]  [address]       Ph: [phone]       Fax: [phone]   RxID:   [id number] REGLAN 10 MG  TABS (METOCLOPRAMIDE HCL) As per prep instructions.  #2 x 0   Entered by:   [name] CMA (AAMA)   Authorized by:   Hart Carwin MD   Signed by:   [name] CMA (AAMA) on 06/16/2009   Method used:    Electronically to        [facility] (retail)       [address]       [place], [place]  [address]       Ph: [phone]       Fax: [phone]   RxID:   [id number] MIRALAX   POWD (POLYETHYLENE GLYCOL 3350) As per prep  instructions.  #255gm x 0   Entered by:   [name] CMA (AAMA)   Authorized by:   Hart Carwin MD   Signed by:   [name] CMA (AAMA) on 06/16/2009   Method used:   Electronically to        [facility] (retail)       [address]       [place], [place]  [address]       Ph: [phone]       Fax: [phone]   RxID:   [id number]

## 2010-04-20 NOTE — Letter (Signed)
Summary: [facility] Office Note  [facility] Office Note   Imported By: Roderic Ovens 04/07/2009 13:28:36  _____________________________________________________________________  External Attachment:    Type:   Image     Comment:   External Document

## 2010-04-20 NOTE — Miscellaneous (Signed)
  Clinical Lists Changes  Observations: Added new observation of CARDIO HPI: Since the last visit I have reviewed the old chart.  There was a Doppler done of the legs showing no significant arterial disease in 2005.  It is of note that the patient is status post abdominal aortic aneurysm repair.  It would be very reasonable to consider both abdominal ultrasound and peripheral Dopplers to reassess flow in all these areas. (11/30/2009 16:17) Added new observation of PAST MED HX:  ATRIAL FIBRILLATION (ICD-427.31) AUTOMATIC IMPLANTABLE CARDIAC DEFIBRILLATOR SITU (ICD-V45.02) ACUTE ON CHRONIC SYSTOLIC HEART FAILURE (ICD-428.23) CORONARY ARTERY DISEASE (ICD-414.00) CORONARY ARTERY BYPASS GRAFT, HX OF (ICD-V45.81) HYPERTENSION, ESSENTIAL NOS (ICD-401.9) HYPERLIPIDEMIA NEC/NOS (ICD-272.4) ABDOMINAL AORTIC ANEURYSM REPAIR, HX OF (ICD-V15.1) FLUID OVERLOAD (ICD-276.6) * STATUS POST LONG-TERM AMIODARONE THERAPY EVENTUALLY STOPPED HYPOTENSION (ICD-458.9) CLAUDICATION (ICD-443.9)  Doppler..... 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-585.2) GOUT, HX OF (ICD-V12.2) * CLEARANCE FOR COLONOSCOPY CHRONIC RHINITIS (ICD-472.0) BACK PAIN, LUMBAR (ICD-724.2) HYPERPLASIA, PRST NOS W/O URINARY OBST/LUTS (ICD-600.90) COLONIC POLYPS (ICD-211.3) PILONIDAL CYSTECTOMY (ICD-685.1) GOUT (ICD-274.9) DVT (ICD-453.40) DIVERTICULOSIS, COLON (ICD-562.10)        (11/30/2009 16:17) Added new observation of REFERRING MD: Barnett Abu, MD  Malon Kindle, MD  Richard Ramos,MD (11/30/2009 16:17) Added new observation of PRIMARY MD: [name] MD (11/30/2009 16:17)      Referring Pearl Berlinger:  Barnett Abu, MD  Malon Kindle, MD  Richard Ramos,MD Primary Shailyn Weyandt:  [name] MD   History of Present Illness: Since the last visit I have reviewed the old chart.  There was a Doppler done of the legs showing no  significant arterial disease in 2005.  It is of note that the patient is status post abdominal aortic aneurysm repair.  It would be very reasonable to consider both abdominal ultrasound and peripheral Dopplers to reassess flow in all these areas.   Past History:  Past Medical History:  ATRIAL FIBRILLATION (ICD-427.31) AUTOMATIC IMPLANTABLE CARDIAC DEFIBRILLATOR SITU (ICD-V45.02) ACUTE ON CHRONIC SYSTOLIC HEART FAILURE (ICD-428.23) CORONARY ARTERY DISEASE (ICD-414.00) CORONARY ARTERY BYPASS GRAFT, HX OF (ICD-V45.81) HYPERTENSION, ESSENTIAL NOS (ICD-401.9) HYPERLIPIDEMIA NEC/NOS (ICD-272.4) ABDOMINAL AORTIC ANEURYSM REPAIR, HX OF (ICD-V15.1) FLUID OVERLOAD (ICD-276.6) * STATUS POST LONG-TERM AMIODARONE THERAPY EVENTUALLY STOPPED HYPOTENSION (ICD-458.9) CLAUDICATION (ICD-443.9)  Doppler..... 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-585.2) GOUT, HX OF (ICD-V12.2) * CLEARANCE FOR COLONOSCOPY CHRONIC RHINITIS (ICD-472.0) BACK PAIN, LUMBAR (ICD-724.2) HYPERPLASIA, PRST NOS W/O URINARY OBST/LUTS (ICD-600.90) COLONIC POLYPS (ICD-211.3) PILONIDAL CYSTECTOMY (ICD-685.1) GOUT (ICD-274.9) DVT (ICD-453.40) DIVERTICULOSIS, COLON (ICD-562.10)         Referring Dawsen Krieger:  Barnett Abu, MD  Malon Kindle, MD  Richard Ramos,MD Primary Anabia Weatherwax:  [name] MD   History of Present Illness: Since the last visit I have reviewed the old chart.  There was a Doppler done of the legs showing no significant arterial disease in 2005.  It is of note that the patient is status post abdominal aortic aneurysm repair.  It would be very reasonable to consider both abdominal ultrasound and peripheral Dopplers to reassess flow in all these areas.

## 2010-04-20 NOTE — Assessment & Plan Note (Signed)
Summary: [name]  Medications Added KLOR-CON M20 20 MEQ CR-TABS (POTASSIUM CHLORIDE CRYS CR) take one tablet daily AMLODIPINE BESYLATE 5 MG TABS (AMLODIPINE BESYLATE) Take 1 tab at bedtime AMLODIPINE BESYLATE 5 MG TABS (AMLODIPINE BESYLATE) Take 1 tab at supper      Allergies Added:   Visit Type:  Follow-up Referring Provider:  n/a Primary Provider:  [name] MD  CC:  hypertension.  History of Present Illness: The patient is seen for followup of his multiple cardiac problems.  Most recently we have been working on his blood pressure.  He has an unusual problem with high blood pressure in the morning followed within an hour or 2 of the lower blood pressure.  I added amlodipine at suppertime and he actually is doing well.  Pressures are looking better.  Most recently his morning pressures have been running in the range of 140/95 which is actually good for him.  Daytime pressures were lower.  He feels good with this.  Current Medications (verified): 1)  Adult Aspirin Ec Low Strength 81 Mg  Tbec (Aspirin) .Marland Kitchen.. 1 By Mouth Q Week 2)  Warfarin Sodium 2.5 Mg Tabs (Warfarin Sodium) .... Use As Directed By [facility] 3)  Metformin Hcl 500 Mg  Tabs (Metformin Hcl) .Marland Kitchen.. 1qd Wih Largest Meal 4)  Klor-Con M10 10 Meq Cr-Tabs (Potassium Chloride Crys Cr) .... Take 1 Tablet By Mouth Two Times A Day 5)  Furosemide 40 Mg  Tabs (Furosemide) .... Take 1 Tablet By [facility] Times A Day 6)  Benazepril Hcl 10 Mg Tabs (Benazepril Hcl) .... Take 1/2  Tablet By Mouth Once A Day 7)  Accu-Chek Comfort Curve   Strp (Glucose Blood) .... Test M,w,f,sun and Check Blood Sugars 2 Hours After Meal 8)  Accu-Chek Soft Touch Lancets   Misc (Lancets) .... Use As Directed 9)  Simvastatin 40 Mg Tabs (Simvastatin) .Marland Kitchen.. 1 At Bedtime, Appointment Due 10)  Glimepiride 2 Mg Tabs (Glimepiride) .... 1/2 Once Daily 11)  Carvedilol 25 Mg Tabs (Carvedilol) .... Take 1 Tab By Mouth Every Am and  2 Tabs Every Pm 12)   Nitrostat 0.4 Mg Subl (Nitroglycerin) .Marland Kitchen.. 1 Tablet Under Tongue At Onset of Chest Pain; You May Repeat Every 5 Minutes For Up To 3 Doses. 13)  Amlodipine Besylate 5 Mg Tabs (Amlodipine Besylate) .... Take 1 Tab At Bedtime 14)  Miralax   Powd (Polyethylene Glycol 3350) .... As Per Prep  Instructions. 15)  Reglan 10 Mg  Tabs (Metoclopramide Hcl) .... As Per Prep Instructions. 16)  Dulcolax 5 Mg  Tbec (Bisacodyl) .... Day Before Procedure Take 2 At 3pm and 2 At 8pm.  Allergies (verified): 1)  ! Pcn  Past History:  Past Medical History: Last updated: 03/11/2009 Diabetes mellitus, type II Hyperlipidemia Hypertension penicillin allergy  pilonidal cyst Atrial fibrillation.... long-term high-dose amiodarone therapy multiple cardioversions.Marland Kitchen amiodarone stopped September 2009 Atrial flutter present... February 02, 2009 Left atrial clot but cardioversions done since that time. Claudication significant peripheral disease in the legs and question mesenteric disease Low back pain from spinal stenosis, DDD....surgery... Dr. Danielle Dess CAD  .Marland KitchenMarland KitchenCatheterization July 2008 vein grafts and LIMA patent..... 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 Georganna Skeans infected leg after bypass treated Nasal drainage  ...chronic Skin discoloration from amiodarone and easy bruisability from Coumadin and aspirin TSH.... borderline low Excess beer consumption.... but not a true alcohol problem Renal insufficiency creatinine 1.2-1.7 range ICD placement 2009 by Dr. Ladona Ridgel also  biventricular pacing.... flutter could not be ablated as it is left sided. Chronotropic incompetence pacer rate adjusted June 09 with improvement. 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           Review of Systems       Patient denies fever, chills, headache, sweats, rash, change in vision, change in hearing, chest pain, cough.  He does have chronic shortness of breath.  He has no nausea vomiting or urinary symptoms.  Vital Signs:  Patient profile:   70 year old male Height:      72 inches Weight:      240 pounds BMI:     32.67 Pulse rate:   70 / minute BP sitting:   112 / 72  (left arm) Cuff size:   regular  Vitals Entered By: Hardin Negus, RMA (June 17, 2009 11:24 AM)  Physical Exam  General:  he is quite stable in general. Eyes:  no xanthelasma. Neck:  no jugulovenous distention. Lungs:  lungs are clear respiratory effort is nonlabored. Heart:  cardiac exam reveals S1 and S2.  No clicks or significant murmurs. Abdomen:  abdomen is protuberant but soft. Extremities:  no peripheral edema. Psych:  patient is oriented to person time and place.  Affect is normal.    ICD Specifications Following MD:  [name], MD     Referring MD:  KATZ ICD Vendor:  [employer]     ICD Model Number:  [id number]     ICD Serial Number:  [id number] ICD DOI:  04/11/2007     ICD Implanting MD:  [name], MD  Lead 1:    Location: RA     DOI: 04/11/2007     Model #: [id number]     Serial #: [id number]     Status: active Lead 2:    Location: RV     DOI: 04/11/2007     Model #: [id number]     Serial #: [id number]     Status: active Lead 3:    Location: LV     DOI: 04/11/2007     Model #: 1324     Serial #: [id number]     Status: active  Indications::  ICM, CHF   ICD Follow Up ICD Dependent:  No       ICD Device Measurements Configuration: LV TIP TO RV COIL  Episodes Coumadin:  Yes  Brady Parameters Mode DDDR      Lower Rate Limit:  60     Upper Rate Limit 130 PAV 200     Sensed AV Delay:  180  Tachy Zones VF:  200     VT:  250 FVT VIA VF     VT1:  176     Impression & Recommendations:  Problem # 1:  * CLEARANCE FOR COLONOSCOPY The patient is cleared for colonoscopy.  It will be okay to hold his Coumadin and proceed with colonoscopy 5 days later.  His Coumadin can be restarted.  Problem # 2:  FLUID OVERLOAD (ICD-276.6) Volume status is stable.  No change in therapy.  Problem # 3:  BACK PAIN, LUMBAR (ICD-724.2) The patient is post surgery.  Unfortunately he continues to have some pain.  Therapy has been recommended and he is stable from the cardiac viewpoint.  Problem # 4:  HYPERTENSION, ESSENTIAL NOS (ICD-401.9)  His updated medication list for this problem includes:    Adult Aspirin Ec Low Strength 81 Mg Tbec (Aspirin) .Marland Kitchen... 1 by mouth q week    Furosemide 40 Mg Tabs (Furosemide) .Marland Kitchen... Take 1 tablet by mouthtwo-three times a day    Benazepril Hcl 10 Mg Tabs (Benazepril hcl) .Marland Kitchen... Take 1/2  tablet by mouth once a day    Carvedilol 25 Mg Tabs (Carvedilol) .Marland Kitchen... Take 1 tab by mouth every am and  2 tabs every pm    Amlodipine Besylate 5 Mg Tabs (Amlodipine besylate) .Marland Kitchen... Take 1 tab at supper blood  pressure is now under better control with his current regimen.  No further changes today.  Patient Instructions: 1)  Your physician recommends that you schedule a follow-up appointment in: 8 weeks 2)  Your physician has recommended you make the following change in your medication: klor-con 20 meq take one tablet daily. Prescriptions: KLOR-CON M20 20 MEQ CR-TABS (POTASSIUM CHLORIDE CRYS CR) take one tablet daily  #30 x 6   Entered by:   Sherri Rad, RN, BSN   Authorized by:   Talitha Givens, MD, [facility]   Signed by:   Sherri Rad, RN, BSN on 06/17/2009   Method used:   Electronically to        [facility] (retail)       [address]       [place], [place]  [address]       Ph: [phone]       Fax: [phone]   RxID:   [id number]

## 2010-04-20 NOTE — Letter (Signed)
Summary: [facility]  [facility]   Imported By: Lanelle Bal 06/19/2009 11:04:56  _____________________________________________________________________  External Attachment:    Type:   Image     Comment:   External Document

## 2010-04-20 NOTE — Assessment & Plan Note (Signed)
Summary: f6w  Medications Added CARVEDILOL 25 MG TABS (CARVEDILOL) Take one tablet by mouth every AM and 1 & 1/2 every PM NITROSTAT 0.4 MG SUBL (NITROGLYCERIN) 1 tablet under tongue at onset of chest pain; you may repeat every 5 minutes for up to 3 doses.      Allergies Added:   Visit Type:  Follow-up Primary Provider:  [name] MD  CC:  CAD and atrial fibrillation.  History of Present Illness: The patient is seen for followup of coronary disease and atrial fibrillation and hypertension and left ventricular dysfunction.  He continues to improve from his major back surgery.  He brought in multiple blood pressure readings and continue to show that he has a higher blood pressure first thing in the morning in the range of 140-160 followed by pressures ranging from 105-120 a few hours later.  His ICD is interrogated today and the optovol is actually the best that we've seen it..  It had been elevated I cannot be sure why it is improved.  He is in atrial fibrillation 100% of the time such that he is not getting biventricular pacing.  I had increased his carvedilol dose.  It is possible that this plays a role.  His ventricular rate appears to be very reasonable primarily in the 75-85 range but it does range to higher levels.  Current Medications (verified): 1)  Adult Aspirin Ec Low Strength 81 Mg  Tbec (Aspirin) .Marland Kitchen.. 1 By Mouth Qweek 2)  Warfarin Sodium 2.5 Mg Tabs (Warfarin Sodium) .... Use As Directed By Anticoagualtion Clinic 3)  Metformin Hcl 500 Mg  Tabs (Metformin Hcl) .Marland Kitchen.. 1qd Wih Largest Meal 4)  Klor-Con M10 10 Meq Cr-Tabs (Potassium Chloride Crys Cr) .... Take 1 Tablet By Mouth Two Times A Day 5)  Furosemide 40 Mg  Tabs (Furosemide) .... Take 1 Tablet By [facility] Times A Day 6)  Benazepril Hcl 10 Mg Tabs (Benazepril Hcl) .... Take 1/2  Tablet By Mouth Once A Day 7)  Accu-Chek Comfort Curve   Strp (Glucose Blood) .... Use As Directed 8)  Accu-Chek Soft Touch Lancets   Misc  (Lancets) .... Use As Directed 9)  Simvastatin 40 Mg Tabs (Simvastatin) .Marland Kitchen.. 1 At Bedtime 10)  Glimepiride 2 Mg Tabs (Glimepiride) .... 1/2 Once Daily 11)  Calcitonin (Salmon) 200 Unit/act Soln (Calcitonin ([place])) .... Once Daily 12)  Carvedilol 25 Mg Tabs (Carvedilol) .... Take One Tablet By Mouth Twice A Day  Allergies (verified): 1)  ! Pcn  Past History:  Past Medical History: Last updated: 03/11/2009 Diabetes mellitus, type II Hyperlipidemia Hypertension penicillin allergy  pilonidal cyst Atrial fibrillation.... long-term high-dose amiodarone therapy multiple cardioversions.Marland Kitchen amiodarone stopped September 2009 Atrial flutter present... February 02, 2009 Left atrial clot but cardioversions done since that time. Claudication significant peripheral disease in the legs and question mesenteric disease Low back pain from spinal stenosis, DDD....surgery... Dr. Danielle Dess CAD  .Marland KitchenMarland KitchenCatheterization July 2008 vein grafts and LIMA patent..... 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 Georganna Skeans infected leg after bypass treated Nasal drainage  ...chronic Skin discoloration from amiodarone and easy bruisability from Coumadin and aspirin TSH.... borderline low Excess  beer consumption.... but not a true alcohol problem Renal insufficiency creatinine 1.2-1.7 range ICD placement 2009 by Dr. Ladona Ridgel also biventricular pacing.... flutter could not be ablated as it is left sided. Chronotropic incompetence pacer rate adjusted June 09 with improvement. 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           Review of Systems       The patient denies fever, chills, headache, sweats, rash, change in vision, change in hearing, chest pain, cough, nausea vomiting, urinary symptoms.  All other systems are reviewed and are negative.  Vital Signs:  Patient profile:   70 year old male Height:      72.5 inches Weight:      223 pounds BMI:     29.94 Pulse rate:   82 / minute BP sitting:   134 / 78  (left arm) Cuff size:   large  Vitals Entered By: Hardin Negus, RMA (March 27, 2009 2:31 PM)  Physical Exam  General:  patient looks good today. Head:  head is atraumatic. Eyes:  eyes reveal no xanthelasma. Neck:  no jugular venous distention. Chest Wall:  no chest wall tenderness. Lungs:  lungs are clear.  Respiratory effort is nonlabored. Heart:  cardiac exam reveals S1 and S2.  There no clicks or significant murmurs. Abdomen:  his abdomen is protuberant but soft. Msk:  no musculoskeletal deformities. Extremities:  no peripheral edema. Skin:  no skin rashes. Psych:  patient is oriented to person time and place.  Affect is normal.    ICD Specifications Following MD:  [name], MD     Referring MD:  Gricelda Foland ICD Vendor:  [employer]     ICD Model Number:  [id number]     ICD Serial Number:  [id number] ICD DOI:  04/11/2007     ICD Implanting MD:  [name], MD  Lead 1:    Location: RA     DOI: 04/11/2007     Model #: [id number]     Serial #: [id number]     Status: active Lead 2:    Location: RV     DOI: 04/11/2007     Model #: [id number]     Serial #: [id number]     Status: active Lead 3:    Location: LV     DOI: 04/11/2007     Model #: [id number]     Serial #: [id number]     Status: active  Indications::  ICM, CHF   ICD Follow Up Remote Check?  No Battery Voltage:  2.97 V     Charge Time:  10.1 seconds     Underlying rhythm:  A-fib ICD Dependent:  No       ICD Device  Measurements Atrium:  Amplitude: 3.1 mV, Impedance: 520 ohms,  Right Ventricle:  Amplitude: 12.2 mV, Impedance: 512 ohms, Threshold: 1.0 V at 0.4 msec Left Ventricle:  Impedance: 472 ohms, Threshold: 2.5 V at 0.9 msec Configuration: LV TIP TO RV COIL Shock Impedance: 68 ohms   Episodes MS Episodes:  1     Percent Mode Switch:  100%     Coumadin:  Yes Shock:  0     ATP:  0     Nonsustained:  0     Atrial Pacing:  0.3%     Ventricular Pacing:  61.5%  Brady Parameters Mode DDDR     Lower Rate Limit:  60     Upper Rate Limit 130  PAV 200     Sensed AV Delay:  180  Tachy Zones VF:  200     VT:  250 FVT VIA VF     VT1:  176     Next Cardiology Appt Due:  06/19/2009 Tech Comments:  A-fib with ventricular rates controlle, + coumadin.  Optivol normal.  No parameter changes.  ROV 3 months with Dr. Ladona Ridgel. Altha Harm, LPN  March 27, 2009 2:56 PM   Impression & Recommendations:  Problem # 1:  FLUID OVERLOAD (ICD-276.6) His fluid status is quite good at this time.  No change in his diuretics.  Problem # 2:  AUTOMATIC IMPLANTABLE CARDIAC DEFIBRILLATOR SITU (ICD-V45.02) His ICD was interrogated today.  I have reviewed all of the information and discuss this with our technician.  As outlined he does have atrial fibrillation the entire time.  The optovol measurements are good suggesting that his volume status is optimal.  I believe that some of his improvement may be related to the increase carvedilol dose.  Because of this I will increase the dose further to try to treat his nighttime hypertension were completely.  His carvedilol be increased to 37.5 mg at night and it will be kept at 20 g in the morning.  Problem # 3:  ATRIAL FIBRILLATION (ICD-427.31)  His updated medication list for this problem includes:    Adult Aspirin Ec Low Strength 81 Mg Tbec (Aspirin) .Marland Kitchen... 1 by mouth qweek    Warfarin Sodium 2.5 Mg Tabs (Warfarin sodium) ..... Use as directed by anticoagualtion clinic    Carvedilol 25  Mg Tabs (Carvedilol) .Marland Kitchen... Take one tablet by mouth every am and 1 & 1/2 every pm At this time he does have atrial fib underlying his rhythm.  I will not try to convert him back to regular.  Carvedilol dose is to be increased.  Problem # 4:  * CLEARANCE FOR COLONOSCOPY The patient is stable and he can be cleared for colonoscopy  Patient Instructions: 1)  Increase Carvedilol to 1 tab in AM and 1 & 1/2 tabs in PM 2)  Keep follow up appts as scheduled Prescriptions: NITROSTAT 0.4 MG SUBL (NITROGLYCERIN) 1 tablet under tongue at onset of chest pain; you may repeat every 5 minutes for up to 3 doses.  #25 x 3   Entered by:   Meredith Staggers, RN   Authorized by:   Talitha Givens, MD, [facility]   Signed by:   Meredith Staggers, RN on 03/27/2009   Method used:   Print then Give to Patient   RxID:   [id number] CARVEDILOL 25 MG TABS (CARVEDILOL) Take one tablet by mouth every AM and 1 & 1/2 every PM  #125 x 3   Entered by:   Meredith Staggers, RN   Authorized by:   Talitha Givens, MD, [facility]   Signed by:   Meredith Staggers, RN on 03/27/2009   Method used:   Print then Give to Patient   RxID:   [id number]

## 2010-04-20 NOTE — Assessment & Plan Note (Signed)
Summary: cpx/fasting//kn   Vital Signs:  Patient profile:   70 year old male Height:      72 inches (182.88 cm) Weight:      247 pounds (112.27 kg) BMI:     33.62 Temp:     98.1 degrees F (36.72 degrees C) oral Resp:     17 per minute BP sitting:   110 / 70  (left arm) Cuff size:   regular  Vitals Entered By: Lucious Groves CMA (January 07, 2010 8:37 AM) CC: Fasting CPX./kb, Type 2 diabetes mellitus follow-up Comments Patient BP on home monitor was 108/76./kb   Primary Care Provider:  [name] MD  CC:  Fasting CPX./kb and Type 2 diabetes mellitus follow-up.  History of Present Illness: Here for Medicare AWV: 1.Risk factors based on Past M, S, F history: chart updated;Dr Elsner's N-S 08/2009 & Dr Ethelene Hal 10/2009 evaluations reviewed : no radiculopathy  but peripheral neuropathy. By voice report Dr Adele Dan evaluation indicates no significant vascular compromise as factor. Dr Danielle Dess diagnosed vit D deficiency; he is on vit 1000 International Units once daily since 08/2009. 2.Physical Activities: supervised  class 2 X  @ [employer]; machines X 45 min 3.Depression/mood: no issues 4.Hearing: severe deficit present , referral offered 5.ADL's: no limitations  6.Fall Risk: he must use cane for ambulation because of sensory loss RLE below knee 7.Home Safety: home safety proofed ; smoke detectors in place. Second hand smoke exposure discussed with him & wife. 8.Height, weight, &visual acuity: wall chart read with  lenses  9.Counseling: POA & Living Will needed 10.Labs ordered based on risk factors: see Orders 11. Referral Coordination: given name of Audiologist; Neurology referral pending 12. Care Plan: see Instructions 13. Cognitive Assessment:Oriented X 3 ; memory & recall excellent; "WORLD"  spelled backwards; mood & affect normal. Type 2 Diabetes Mellitus Follow-Up      The patient reports weight gain  of 5#and numbness  only in RLE but tingling in RLE > LLE. He  denies polyuria,  polydipsia, blurred vision, and self managed hypoglycemia.  The patient denies the following symptoms: neuropathic pain, chest pain, vomiting, orthostatic symptoms, poor wound healing, intermittent claudication, vision loss, and foot ulcer.  Since the last visit the patient reports good dietary compliance.  The patient has been measuring capillary blood glucose before breakfast ( average 114).  He has appt with Ophthalmologist  in 2 weeks.He has early cataract in OS . Last Podiatry appt 02/2009.Hyperlipidemia Follow-Up      The patient also presents for Hyperlipidemia follow-up.  The patient denies muscle aches, GI upset, abdominal pain, flushing, itching, constipation, diarrhea, and fatigue.  Other symptoms include pedal edema in RLE.  The patient denies the following symptoms: dypsnea, palpitations, and syncope.  Compliance with medications (by patient report) has been near 100%.  Adjunctive measures currently used by the patient include ASA once weekly.    Current Medications (verified): 1)  Adult Aspirin Ec Low Strength 81 Mg  Tbec (Aspirin) .Marland Kitchen.. 1 By Mouth Q Week 2)  Warfarin Sodium 2.5 Mg Tabs (Warfarin Sodium) .... Use As Directed By Anticoagualtion Clinic 3)  Metformin Hcl 500 Mg  Tabs (Metformin Hcl) .Marland Kitchen.. 1qd Wih Largest Meal 4)  Klor-Con M20 20 Meq Cr-Tabs (Potassium Chloride Crys Cr) .... Take One Tablet Daily 5)  Furosemide 40 Mg  Tabs (Furosemide) .... Take 1 Tablet By [facility] Times A Day 6)  Benazepril Hcl 10 Mg Tabs (Benazepril Hcl) .... Take 1/2  Tablet By Mouth Once A Day 7)  Accu-Chek Comfort Curve   Strp (Glucose Blood) .... Test M,w,f,sun and Check Blood Sugars 2 Hours After Meal 8)  Accu-Chek Soft Touch Lancets   Misc (Lancets) .... Use As Directed 9)  Simvastatin 40 Mg Tabs (Simvastatin) .Marland Kitchen.. 1 At Bedtime, Appointment Due 10)  Glimepiride 2 Mg Tabs (Glimepiride) .... 1/2 Once Daily 11)  Carvedilol 25 Mg Tabs (Carvedilol) .... Take 1 Tab By Mouth Every Am and  2 Tabs Every  Pm 12)  Nitrostat 0.4 Mg Subl (Nitroglycerin) .Marland Kitchen.. 1 Tablet Under Tongue At Onset of Chest Pain; You May Repeat Every 5 Minutes For Up To 3 Doses. 13)  Amlodipine Besylate 5 Mg Tabs (Amlodipine Besylate) .... Take 1 Tab At Supper 14)  Indomethacin 25 Mg Caps (Indomethacin) .... Take 1 Capsule By Mouth Three Times A Day As Needed 15)  Methocarbamol 500 Mg Tabs (Methocarbamol) .Marland Kitchen.. 1-3 Times Daily  Allergies (verified): 1)  ! Pcn  Past History:  Past Medical History: ATRIAL FIBRILLATION (ICD-427.31) AUTOMATIC IMPLANTABLE CARDIAC DEFIBRILLATOR SITU (ICD-V45.02) ACUTE ON CHRONIC SYSTOLIC HEART FAILURE (ICD-428.23) CORONARY ARTERY DISEASE (ICD-414.00) CORONARY ARTERY BYPASS GRAFT, HX OF (ICD-V45.81) HYPERTENSION, ESSENTIAL NOS (ICD-401.9) HYPERLIPIDEMIA NEC/NOS (ICD-272.4) ABDOMINAL AORTIC ANEURYSM REPAIR, HX OF (ICD-V15.1)  STATUS POST LONG-TERM AMIODARONE THERAPY ,PMH of HYPOTENSION (ICD-458.9) CLAUDICATION (ICD-443.9)  Doppler..... 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 Colon polypectomy 2004 ,Dr Juanda Chance (due 05/2008) Abdominal aortic aneurysm repair 2004 Coronary artery bypass graft 2004 Cervical X stops Defibrillator 01/09 Lumbar fusion with rods , screws 10/02/2008 , Dr Danielle Dess pilonidal cyst removal Surgery on the scrotum for varicose veins as a child.  Family History: No strong family history of coronary disease. No FH of Colon Cancer: Father: DM, CAD Mother: HTN, CVA Siblings: neg  Social  History: Married Retired Drinks approximately  3-4  beers daily. Former Smoker: quit 1994  Review of Systems  The patient denies anorexia, fever, prolonged cough, hemoptysis, melena, hematochezia, hematuria, suspicious skin lesions, unusual weight change, enlarged lymph nodes, and angioedema.         Bruising on coumadin  Physical Exam  General:  alert,appropriate and cooperative throughout examination; communication hindered by hearing loss  Head:  Normocephalic and atraumatic without obvious abnormalities. Pattern alopecia Eyes:  No corneal or conjunctival inflammation noted. Perrla. Funduscopic exam benign, without hemorrhages, exudates or papilledema. Arteriolar narrowing Ears:  External ear exam shows no significant lesions or deformities.  Otoscopic examination reveals clear canals, tympanic membranes are intact bilaterally without bulging, retraction, inflammation or discharge. Hearing is markedly decreased Nose:  External nasal examination shows no deformity or inflammation. Nasal mucosa are pink and moist without lesions or exudates. Mouth:  Oral mucosa and oropharynx without lesions or exudates.  Dentures Neck:  No deformities, masses, or tenderness noted. Lungs:  Normal respiratory effort, chest expands symmetrically. Lungs are clear to auscultation, no crackles or wheezes. BS slightly decreased Heart:  bradycardia and irregular rhythm.   Abdomen:  Bowel sounds positive,abdomen soft and non-tender without masses, organomegaly. large ventral hernia Rectal:  No external abnormalities noted. Normal sphincter tone. No rectal masses or tenderness. Genitalia:  Testes bilaterally descended without nodularity, tenderness or masses. Large  R hydrocele /  varicocele.   Prostate:  Prostate gland small,firm and smooth, no enlargement, nodularity, tenderness, mass, asymmetry or induration. Msk:  No deformity or scoliosis noted of thoracic or lumbar spine.   Pulses:  R and L carotid,radial  pulses are full and equal bilaterally. Decreased pedal pulses esp PTP Extremities:  1/2 +left and  right pedal edema.   Chronic nail changes Neurologic:  alert & oriented X3; sensation intact to light touch over feet  inconsistent. Decreased R knee reflex Skin:  Intact without suspicious lesions or rashes. Scattered bruises  diffusely Cervical Nodes:  No lymphadenopathy noted Axillary Nodes:  No palpable lymphadenopathy Psych:  memory intact for recent and remote, normally interactive, and good eye contact.     Impression & Recommendations:  Problem # 1:  PREVENTIVE HEALTH CARE (ICD-V70.0)  Orders: [facility] -Subsequent Annual Wellness Visit [phone])  Problem # 2:  PERIPHERAL NEUROPATHY (ICD-356.9) Gabapentin trial Rxed  Problem # 3:  VITAMIN D DEFICIENCY (ICD-268.9)  Orders: Venipuncture (60454) T-Vitamin D (25-Hydroxy) (09811-91478)  Problem # 4:  HYPERTENSION, ESSENTIAL NOS (ICD-401.9)  controlled His updated medication list for this problem includes:    Furosemide 40 Mg Tabs (Furosemide) .Marland Kitchen... Take 1 tablet by mouthtwo-three times a day    Benazepril Hcl 10 Mg Tabs (Benazepril hcl) .Marland Kitchen... Take 1/2  tablet by mouth once a day    Carvedilol 25 Mg Tabs (Carvedilol) .Marland Kitchen... Take 1 tab by mouth every am and  2 tabs every pm    Amlodipine Besylate 5 Mg Tabs (Amlodipine besylate) .Marland Kitchen... Take 1 tab at supper  Orders: Venipuncture (29562) TLB-BMP (Basic Metabolic Panel-BMET) (80048-METABOL)  Problem # 5:  HYPERLIPIDEMIA NEC/NOS (ICD-272.4)  His updated medication list for this problem includes:    Simvastatin 40 Mg Tabs (Simvastatin) .Marland Kitchen... 1 at bedtime, appointment due  Orders: Venipuncture (13086) TLB-Lipid Panel (80061-LIPID) TLB-Hepatic/Liver Function Pnl (80076-HEPATIC) TLB-TSH (Thyroid Stimulating Hormone) (84443-TSH) Specimen Handling (57846)  Problem # 6:  DIABETES MELLITUS, CONTROLLED (ICD-250.00) Assessment: Unchanged  His updated medication list for this problem  includes:    Adult Aspirin Ec Low Strength 81 Mg Tbec (Aspirin) .Marland Kitchen... 1 by mouth q week    Metformin Hcl 500 Mg Tabs (Metformin hcl) .Marland Kitchen... 1qd wih largest meal    Benazepril Hcl 10 Mg Tabs (Benazepril hcl) .Marland Kitchen... Take 1/2  tablet by mouth once a day    Glimepiride 2 Mg Tabs (Glimepiride) .Marland Kitchen... 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 1 by mouth q week 2)  Warfarin Sodium 2.5 Mg Tabs (Warfarin sodium) .... Use as directed by anticoagualtion clinic 3)  Metformin Hcl 500 Mg Tabs (Metformin hcl) .Marland Kitchen.. 1qd wih largest meal 4)  Klor-con M20 20 Meq Cr-tabs (Potassium chloride crys cr) .... Take one tablet daily 5)  Furosemide 40 Mg Tabs (Furosemide) .... Take 1 tablet by mouthtwo-three times a day 6)  Benazepril Hcl 10 Mg Tabs (Benazepril hcl) .... Take 1/2  tablet by mouth once a day 7)  Accu-chek Comfort Curve Strp (Glucose blood) .... Test m,w,f,sun and check blood sugars 2 hours after meal 8)  Accu-chek Soft Touch Lancets Misc (Lancets) .... Use as directed 9)  Simvastatin 40 Mg Tabs (Simvastatin) .Marland Kitchen.. 1 at bedtime, appointment due 10)  Glimepiride 2 Mg Tabs (Glimepiride) .... 1/2 once daily 11)  Carvedilol 25 Mg Tabs (Carvedilol) .... Take 1 tab by mouth every am and  2 tabs every pm 12)  Nitrostat 0.4 Mg Subl (Nitroglycerin) .Marland Kitchen.. 1 tablet under tongue at onset of chest pain; you may repeat every 5 minutes for up to 3 doses. 13)  Amlodipine Besylate 5 Mg Tabs (Amlodipine besylate) .... Take 1 tab at supper 14)   Indomethacin 25 Mg Caps (Indomethacin) .... Take 1 capsule by mouth three times a day as needed 15)  Methocarbamol 500 Mg Tabs (Methocarbamol) .Marland Kitchen.. 1-3 times daily 16)  Gabapentin 100 Mg Caps (Gabapentin) .Marland Kitchen.. 1 every 8 hrs as needed for leg pain  Patient Instructions: 1)  Check your blood sugars regularly. If your readings are usually above :150 or below 90 you should contact our office. 2)  See your eye doctor yearly to check for diabetic eye damage. 3)  Check your feet each night for sore areas, calluses or signs of infection. Avoid second hand smoke  as discussed. 4)  Check your Blood Pressure regularly. If it is above: 135/85 ON AVERAGE  you should make an appointment. Consider Audiology evaluation. Verify POA & Living Will documents Prescriptions: GABAPENTIN 100 MG CAPS (GABAPENTIN) 1 every 8 hrs as needed for leg pain  #30 x 1   Entered and Authorized by:   [name] MD   Signed by:   [name] MD on 01/07/2010   Method used:   Electronically to        [facility] (retail)       [address]       [place], [place]  [address]       Ph: [phone]       Fax: [phone]   RxID:   [id number] SIMVASTATIN 40 MG TABS (SIMVASTATIN) 1 at bedtime, APPOINTMENT DUE  #90 x 1   Entered and Authorized by:   [name] MD   Signed by:   [name] MD on 01/07/2010   Method used:   Electronically to        [facility] (retail)       [address]       [place], [place]  [address]       Ph: [phone]       Fax: [phone]   RxID:   [id number] METFORMIN HCL 500 MG  TABS (METFORMIN HCL) 1qd wih largest meal  #180 Each x 1   Entered and Authorized by:   [name] MD   Signed by:   [name] MD on 01/07/2010   Method used:   Electronically to        [facility] (retail)       [address]       [place], [place]  [address]       Ph: [phone]       Fax:  [phone]   RxID:   [id number]    Orders Added: 1)  [facility] -Subsequent Annual Wellness Visit [G0439] 2)  Est. Patient Level III [34196] 3)  Venipuncture [22297] 4)  TLB-Lipid Panel [80061-LIPID] 5)  TLB-Hepatic/Liver Function Pnl [80076-HEPATIC] 6)  TLB-CBC Platelet - w/Differential [85025-CBCD] 7)  TLB-TSH (Thyroid Stimulating Hormone) [84443-TSH] 8)  TLB-A1C / Hgb A1C (Glycohemoglobin) [83036-A1C] 9)  TLB-Microalbumin/Creat Ratio, Urine [82043-MALB] 10)  TLB-BMP (Basic Metabolic Panel-BMET) [80048-METABOL] 11)  T-Vitamin D (25-Hydroxy) [30160-10932] 12)  TLB-PSA (Prostate Specific Antigen) [35573-UKG] 13)  Specimen Handling [99000]

## 2010-04-20 NOTE — Medication Information (Signed)
Summary: rov  Anticoagulant Therapy  Managed by: Bethena Midget, RN, BSN Referring MD: [name] MD PCP: [name] MD Supervising MD: Eden Emms MD, Theron Arista Indication 1: Atrial Fibrillation (ICD-427.31) Lab Used: [facility] Site: [facility] INR POC 2.5 INR RANGE 2 - 3  Dietary changes: no    Health status changes: no    Bleeding/hemorrhagic complications: no    Recent/future hospitalizations: no    Any changes in medication regimen? no    Recent/future dental: no  Any missed doses?: no       Is patient compliant with meds? yes      Comments: Seeing Dr. Myrtis Ser  Allergies: 1)  ! Pcn  Anticoagulation Management History:      The patient is taking warfarin and comes in today for a routine follow up visit.  Positive risk factors for bleeding include an age of 69 years or older and presence of serious comorbidities.  Negative risk factors for bleeding include no history of CVA/TIA.  The bleeding index is 'intermediate risk'.  Positive CHADS2 values include History of CHF, History of HTN, and History of Diabetes.  Negative CHADS2 values include Age > 69 years old and Prior Stroke/CVA/TIA.  The start date was 02/15/2002.  His last INR was 1.6 RATIO.  Anticoagulation responsible provider: Eden Emms MD, Theron Arista.  INR POC: 2.5.  Cuvette Lot#: [id number].  Exp: 07/2010.    Anticoagulation Management Assessment/Plan:      The patient's current anticoagulation dose is Warfarin sodium 2.5 mg tabs: Use as directed by Anticoagualtion Clinic.  The target INR is 2 - 3.  The next INR is due 07/15/2009.  Anticoagulation instructions were given to patient.  Results were reviewed/authorized by Bethena Midget, RN, BSN.  He was notified by Bethena Midget, RN, BSN.         Prior Anticoagulation Instructions: INR 1.8  Take 2 tabs today, then take 2 tabs each Wednesday and Saturday and 1 tab on all other days.   Recheck in 4 weeks.    Current Anticoagulation Instructions: INR 2.5 Continue 2.5mg s daily  except 5mg s on Wednesdays and Saturdays. Recheck in 4 weeks.

## 2010-04-20 NOTE — Letter (Signed)
Summary: Remote Device Check  [facility], Main Office  [address]   [place], [place] [address]   Phone: [phone]  Fax: [phone]     December 02, 2009 MRN: [id number]   CYLIS AYARS [address] [place], [place]  [address]   Dear Mr. LIA,   Your remote transmission was recieved and reviewed by your physician.  All diagnostics were within normal limits for you.  __X___Your next transmission is scheduled for:   02-04-2010.  Please transmit at any time this day.  If you have a wireless device your transmission will be sent automatically.   Sincerely,  Vella Kohler

## 2010-04-20 NOTE — Medication Information (Signed)
Summary: rov/tm  Anticoagulant Therapy  Managed by: Weston Brass, PharmD Referring MD: [name] MD PCP: [name] MD Supervising MD: Tenny Craw MD, Gunnar Fusi Indication 1: Atrial Fibrillation (ICD-427.31) Lab Used: [facility] Site: [facility] INR POC 2.4 INR RANGE 2 - 3  Dietary changes: no    Health status changes: no    Bleeding/hemorrhagic complications: no    Recent/future hospitalizations: no    Any changes in medication regimen? no    Recent/future dental: no  Any missed doses?: yes     Details: off Coumadin for myleogram.  Restarted about 2 weeks ago.   Is patient compliant with meds? yes       Allergies: 1)  ! Pcn  Anticoagulation Management History:      The patient is taking warfarin and comes in today for a routine follow up visit.  Positive risk factors for bleeding include an age of 12 years or older and presence of serious comorbidities.  Negative risk factors for bleeding include no history of CVA/TIA.  The bleeding index is 'intermediate risk'.  Positive CHADS2 values include History of CHF, History of HTN, and History of Diabetes.  Negative CHADS2 values include Age > 41 years old and Prior Stroke/CVA/TIA.  The start date was 02/15/2002.  His last INR was 1.6 RATIO.  Anticoagulation responsible provider: Tenny Craw MD, Gunnar Fusi.  INR POC: 2.4.  Exp: 08/12.    Anticoagulation Management Assessment/Plan:      The patient's current anticoagulation dose is Warfarin sodium 2.5 mg tabs: Use as directed by Anticoagualtion Clinic.  The target INR is 2 - 3.  The next INR is due 11/03/2009.  Anticoagulation instructions were given to patient.  Results were reviewed/authorized by Weston Brass, PharmD.  He was notified by Weston Brass PharmD.         Prior Anticoagulation Instructions: INR 2.6 Continue 2.5mg s daily except 5mg s on Wednesdays and Saturdays. Recheck in 4 weeks.   Current Anticoagulation Instructions: INR 2.4  Continue same dose of 1 tablet every day except 2  tablets on Wednesday and Saturday.

## 2010-04-20 NOTE — Procedures (Signed)
Summary: COLON   Colonoscopy  Procedure date:  11/02/2001  Findings:      Location:  [facility].    Procedures Next Due Date:    Colonoscopy: 10/2006 Patient Name: [name], [name] MRN:  Procedure Procedures: Colonoscopy CPT: [address].    with Hot Biopsy(s)CPT: [id number].  Personnel: Endoscopist: Dora L. Juanda Chance, MD.  Referred By: Titus Dubin Alwyn Ren, MD.  Exam Location: Exam performed in Outpatient Clinic. Outpatient  Patient Consent: Procedure, Alternatives, Risks and Benefits discussed, consent obtained, from patient. Consent was obtained by the RN.  Indications Symptoms: Hematochezia.  Average Risk Screening Routine.  History  Pre-Exam Physical: Performed Nov 02, 2001. Cardio-pulmonary exam, Rectal exam, HEENT exam , Abdominal exam, Extremity exam, Neurological exam, Mental status exam WNL.  Exam Exam: Extent of exam reached: Cecum, extent intended: Cecum.  The cecum was identified by appendiceal orifice and IC valve. Colon retroflexion performed. Images taken. ASA Classification: I. Tolerance: good.  Monitoring: Pulse and BP monitoring, Oximetry used. Supplemental O2 given.  Colon Prep Used Golytely for colon prep. Prep results: good.  Sedation Meds: Patient assessed and found to be appropriate for moderate (conscious) sedation. Fentanyl 100 mcg. given IV. Versed 5 mg. given IV.  Findings - DIVERTICULOSIS: Sigmoid Colon. Not bleeding. ICD9: Diverticulosis: 562.10. Comments: mild divertic.  POLYP: Sigmoid Colon, diminutive, sessile polyp. Distance from Anus 15 cm. Procedure:  hot biopsy, removed, retrieved, Polyp sent to pathology. ICD9: Colon Polyps: 211.3.  HEMORRHOIDS: Size: Grade I. Bleeding. Not thrombosed. ICD9: Hemorrhoids, Internal and  External: 455.6.   Assessment Abnormal examination, see findings above.  Diagnoses: 455.6: Hemorrhoids, Internal and  External.  562.10: Diverticulosis.  211.3: Colon Polyps.   Events  Unplanned  Interventions: No intervention was required.  Unplanned Events: There were no complications. Plans  Post Exam Instructions: No aspirin or non-steroidal containing medications: 2 weeks.  Medication Plan: Await pathology. Hemorrhoidal Medications: Anusol Suppositories 1 HS, starting Nov 02, 2001   Patient Education: Patient given standard instructions for: Polyps. Diverticulosis. Yearly hemoccult testing recommended. Patient instructed to get routine colonoscopy every 5 years.  Disposition: After procedure patient sent to recovery. After recovery patient sent home.   This report was created from the original endoscopy report, which was reviewed and signed by the above listed endoscopist.

## 2010-04-20 NOTE — Medication Information (Signed)
Summary: rov/sp  Anticoagulant Therapy  Managed by: Bethena Midget, RN, BSN Referring MD: [name] MD PCP: [name] MD Supervising MD: Myrtis Ser MD, Tinnie Gens Indication 1: Atrial Fibrillation (ICD-427.31) Lab Used: [facility] Site: [facility] INR POC 2.5 INR RANGE 2 - 3  Dietary changes: no    Health status changes: no    Bleeding/hemorrhagic complications: no    Recent/future hospitalizations: no    Any changes in medication regimen? no    Recent/future dental: no  Any missed doses?: no       Is patient compliant with meds? yes      Comments: Saw Dr Myrtis Ser today.  Allergies: 1)  ! Pcn  Anticoagulation Management History:      The patient is taking warfarin and comes in today for a routine follow up visit.  Positive risk factors for bleeding include an age of 64 years or older and presence of serious comorbidities.  Negative risk factors for bleeding include no history of CVA/TIA.  The bleeding index is 'intermediate risk'.  Positive CHADS2 values include History of CHF, History of HTN, and History of Diabetes.  Negative CHADS2 values include Age > 61 years old and Prior Stroke/CVA/TIA.  The start date was 02/15/2002.  His last INR was 1.6 RATIO.  Anticoagulation responsible Real Cona: Myrtis Ser MD, Tinnie Gens.  INR POC: 2.5.  Cuvette Lot#: [id number].  Exp: 12/2010.    Anticoagulation Management Assessment/Plan:      The patient's current anticoagulation dose is Warfarin sodium 2.5 mg tabs: Use as directed by Anticoagualtion Clinic.  The target INR is 2 - 3.  The next INR is due 12/01/2009.  Anticoagulation instructions were given to patient.  Results were reviewed/authorized by Bethena Midget, RN, BSN.  He was notified by Bethena Midget, RN, BSN.         Prior Anticoagulation Instructions: INR 2.4  Continue same dose of 1 tablet every day except 2 tablets on Wednesday and Saturday.   Current Anticoagulation Instructions: INR 2.5 Continue 2.5mg s everyday except 5mg s on Wednesdays  and Saturdays. Recheck in 4 weeks.

## 2010-04-20 NOTE — Procedures (Signed)
Summary: Colonoscopy  Patient: [name] Note: All result statuses are Final unless otherwise noted.  Tests: (1) Colonoscopy (COL)   COL Colonoscopy           DONE     [facility]     [address]     [place], [place]  [address]           COLONOSCOPY PROCEDURE REPORT           PATIENT:  [name], [name]  MR#:  [id number]     BIRTHDATE:  July 11, 1940, 69 yrs. old  GENDER:  male     ENDOSCOPIST:  Hedwig Morton. Juanda Chance, MD     REF. BY:     PROCEDURE DATE:  07/29/2009     PROCEDURE:  Colonoscopy 19[address]     ASA CLASS:  Class III     INDICATIONS:  hematochezia hypeplastic polyp 2003     on Coumadin for at.fib     MEDICATIONS:   Versed 5 mg, Fentanyl 50 mcg           DESCRIPTION OF PROCEDURE:   After the risks benefits and     alternatives of the procedure were thoroughly explained, informed     consent was obtained.  Digital rectal exam was performed and     revealed no rectal masses.   The LB [id number] endoscope     was introduced through the anus and advanced to the cecum, which     was identified by both the appendix and ileocecal valve, without     limitations.  The quality of the prep was good, using MiraLax.     The instrument was then slowly withdrawn as the colon was fully     examined.     <<PROCEDUREIMAGES>>           FINDINGS:  Two polyps were found in the sigmoid to descending     colon segments. 3 and 4 mm polyps removed from 60 cm and 35 cm     Polyp was snared without cautery. Retrieval was successful. snare     polyp The polyp was removed using cold biopsy forceps (see image8,     image5, image4, and image3).  Mild diverticulosis was found in the     sigmoid to descending colon segments (see image7).  Internal     hemorrhoids were found (see image9).  This was otherwise a normal     examination of the colon (see image2 and image1).   Retroflexed     views in the rectum revealed no abnormalities.    The scope was     then withdrawn from the patient  and the procedure completed.           COMPLICATIONS:  None     ENDOSCOPIC IMPRESSION:     1) Two polyps in the sigmoid to descending colon segments     2) Mild diverticulosis in the sigmoid to descending colon     segments     3) Internal hemorrhoids     4) Otherwise normal examination     RECOMMENDATIONS:     1) Await pathology results     stay off Coumadin x 1 week, restart Coumadin on 08/05/2009,     recheck INR on     08/09/2009     REPEAT EXAM:  In 7 year(s) for.           ______________________________     Hedwig Morton. Juanda Chance, MD  CC:           n.     eSIGNED:   Roniya Tetro M. Eldrige Pitkin at 07/29/2009 08:42 AM           Worthy Keeler, [id number]  Note: An exclamation mark (!) indicates a result that was not dispersed into the flowsheet. Document Creation Date: 07/29/2009 8:43 AM _______________________________________________________________________  (1) Order result status: Final Collection or observation date-time: 07/29/2009 08:31 Requested date-time:  Receipt date-time:  Reported date-time:  Referring Physician:   Ordering Physician: Lina Sar [phone]) Specimen Source:  Source: Launa Grill Order Number: [id number] Lab site:   Appended Document: Colonoscopy     Procedures Next Due Date:    Colonoscopy: 07/2014

## 2010-04-20 NOTE — Progress Notes (Signed)
Summary: Simvastatin  Phone Note Call from Patient   Caller: Spouse Summary of Call: Pts spouse wants to request a 3 month rx for Simvistatin. He has a cpx scheduled for Oct. 20.2011. Wife states that she doesn't want to have to call each month until then for the rx. Would like to pick up rx from office. pt can be reached at [phone] Initial call taken by: Lavell Islam,  September 28, 2009 8:44 AM  Follow-up for Phone Call        Spoke with Mrs.Ebony Cargo, she would like [facility] sent to [facility], Faxed Follow-up by: Shonna Chock,  September 28, 2009 8:54 AM    Prescriptions: SIMVASTATIN 40 MG TABS (SIMVASTATIN) 1 at bedtime, APPOINTMENT DUE  #90 x 0   Entered by:   Shonna Chock   Authorized by:   [name] MD   Signed by:   Shonna Chock on 09/28/2009   Method used:   Print then Give to Patient   RxID:   [id number]

## 2010-04-20 NOTE — Assessment & Plan Note (Signed)
Summary: f38m  Medications Added AMLODIPINE BESYLATE 5 MG TABS (AMLODIPINE BESYLATE) Take 1/2 tab daily for 1 week then increase to 1 tab at bedtime      Allergies Added:   Visit Type:  Follow-up Referring Provider:  [name] M.D. Primary Provider:  [name] MD  CC:   hypertension.  History of Present Illness:  The patient is seen primarily today for followup of his hypertension.  We continued to have a difficult time understanding his blood pressure.  Early in the morning his pressure is significantly elevated.  He takes his morning medicines and approximately an hour later his blood pressure is much better.  However his morning medications do not include any significant blood pressure medicines other than her morning dose of carvedilol.  He is currently taking his benazepril and a higher dose of carvedilol in the evening.  There is some suggestion that his pressure may go up in the afternoon.  Current Medications (verified): 1)  Adult Aspirin Ec Low Strength 81 Mg  Tbec (Aspirin) .Marland Kitchen.. 1 By Mouth Qweek 2)  Warfarin Sodium 2.5 Mg Tabs (Warfarin Sodium) .... Use As Directed By Anticoagualtion Clinic 3)  Metformin Hcl 500 Mg  Tabs (Metformin Hcl) .Marland Kitchen.. 1qd Wih Largest Meal 4)  Klor-Con M10 10 Meq Cr-Tabs (Potassium Chloride Crys Cr) .... Take 1 Tablet By Mouth Two Times A Day 5)  Furosemide 40 Mg  Tabs (Furosemide) .... Take 1 Tablet By [facility] Times A Day 6)  Benazepril Hcl 10 Mg Tabs (Benazepril Hcl) .... Take 1/2  Tablet By Mouth Once A Day 7)  Accu-Chek Comfort Curve   Strp (Glucose Blood) .... Test M,w,f,sun and Check Blood Sugars 2 Hours After Meal 8)  Accu-Chek Soft Touch Lancets   Misc (Lancets) .... Use As Directed 9)  Simvastatin 40 Mg Tabs (Simvastatin) .Marland Kitchen.. 1 At Bedtime 10)  Glimepiride 2 Mg Tabs (Glimepiride) .... 1/2 Once Daily 11)  Carvedilol 25 Mg Tabs (Carvedilol) .... Take 1 Tab By Mouth Every Am and  2 Tabs Every Pm 12)  Nitrostat 0.4 Mg Subl  (Nitroglycerin) .Marland Kitchen.. 1 Tablet Under Tongue At Onset of Chest Pain; You May Repeat Every 5 Minutes For Up To 3 Doses.  Allergies (verified): 1)  ! Pcn  Past History:  Past Medical History: Last updated: 03/11/2009 Diabetes mellitus, type II Hyperlipidemia Hypertension penicillin allergy  pilonidal cyst Atrial fibrillation.... long-term high-dose amiodarone therapy multiple cardioversions.Marland Kitchen amiodarone stopped September 2009 Atrial flutter present... February 02, 2009 Left atrial clot but cardioversions done since that time. Claudication significant peripheral disease in the legs and question mesenteric disease Low back pain from spinal stenosis, DDD....surgery... Dr. Danielle Dess CAD  .Marland KitchenMarland KitchenCatheterization July 2008 vein grafts and LIMA patent..... 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 Georganna Skeans infected leg after bypass treated Nasal drainage  ...chronic Skin discoloration from amiodarone and easy bruisability from Coumadin and aspirin TSH.... borderline low Excess beer consumption.... but not a true alcohol problem Renal insufficiency creatinine 1.2-1.7 range ICD placement 2009 by Dr. Ladona Ridgel also biventricular pacing.... flutter could not be ablated as it is left sided. Chronotropic incompetence pacer rate adjusted June 09 with improvement.  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           Review of Systems       Patient denies fever, chills, headache,  sweats, rash, change in vision, change in hearing, chest pain, shortness of breath, c nausea vomiting, urinary symptoms.  All other systems are reviewed and are negative.  Vital Signs:  Patient profile:   70 year old male Height:      72 inches Weight:      234 pounds Pulse rate:   80 / minute BP sitting:   143 / 91  (left arm) Cuff size:   regular  Vitals Entered By: Burnett Kanaris, CNA (May 26, 2009 2:51 PM)  Physical Exam  General:  patient is stable today. Eyes:  no xanthelasma. Neck:  no jugular venous distention. Lungs:  lungs are clear.  Respiratory effort is nonlabored. Heart:  cardiac exam reveals S1-S2.  No clicks or significant murmurs. Abdomen:  abdomen is protuberant but soft. Extremities:  no peripheral edema. Psych:  patient is oriented to person time and place.  Affect is normal.    ICD Specifications Following MD:  [name], MD     Referring MD:  Melady Chow ICD Vendor:  [employer]     ICD Model Number:  [phone]     ICD Serial Number:  [id number] ICD DOI:  04/11/2007     ICD Implanting MD:  [name], MD  Lead 1:    Location: RA     DOI: 04/11/2007     Model #: [id number]     Serial #: [id number]     Status: active Lead 2:    Location: RV     DOI: 04/11/2007     Model #: [id number]     Serial #: [id number]     Status: active Lead 3:    Location: LV     DOI: 04/11/2007     Model #: [id number]     Serial #: [id number]     Status: active  Indications::  ICM, CHF   ICD Follow Up ICD Dependent:  No       ICD Device Measurements Configuration: LV TIP TO RV COIL  Episodes Coumadin:  Yes  Brady Parameters Mode DDDR     Lower Rate Limit:  60     Upper Rate Limit 130 PAV 200     Sensed AV Delay:  180  Tachy Zones VF:  200     VT:  250 FVT VIA VF     VT1:  176     Impression & Recommendations:  Problem # 1:  FLUID OVERLOAD (ICD-276.6) Volume status is stable.  No change in therapy.  Problem # 2:  CORONARY ARTERY DISEASE (ICD-414.00)  His updated medication list for  this problem includes:    Adult Aspirin Ec Low Strength 81 Mg Tbec (Aspirin) .Marland Kitchen... 1 by mouth qweek    Warfarin Sodium 2.5 Mg Tabs (Warfarin sodium) ..... Use as directed by anticoagualtion clinic    Benazepril Hcl 10 Mg Tabs (Benazepril hcl) .Marland Kitchen... Take 1/2  tablet by mouth once a day    Carvedilol 25 Mg Tabs (Carvedilol) .Marland Kitchen... Take 1 tab by mouth every am and  2 tabs every pm    Nitrostat 0.4 Mg Subl (Nitroglycerin) .Marland Kitchen... 1 tablet under tongue at onset of chest pain; you may repeat every 5 minutes for up to 3 doses.    Amlodipine Besylate 5 Mg Tabs (Amlodipine  besylate) .Marland Kitchen... Take 1/2 tab daily for 1 week then increase to 1 tab at bedtime Coronary disease is stable.  No change in therapy.  Problem # 3:  HYPERTENSION, ESSENTIAL NOS (ICD-401.9)  His updated medication list for this problem includes:    Adult Aspirin Ec Low Strength 81 Mg Tbec (Aspirin) .Marland Kitchen... 1 by mouth qweek    Furosemide 40 Mg Tabs (Furosemide) .Marland Kitchen... Take 1 tablet by mouthtwo-three times a day    Benazepril Hcl 10 Mg Tabs (Benazepril hcl) .Marland Kitchen... Take 1/2  tablet by mouth once a day    Carvedilol 25 Mg Tabs (Carvedilol) .Marland Kitchen... Take 1 tab by mouth every am and  2 tabs every pm    Amlodipine Besylate 5 Mg Tabs (Amlodipine besylate) .Marland Kitchen... Take 1/2 tab daily for 1 week then increase to 1 tab at bedtime A2 0.5 mg of amlodipine the patient's medications at supper time.  He will continue to take his nighttime meds later in the evening and his other carbetalol dose in the morning.  Patient Instructions: 1)  Start Amlodipine 5mg --take 1/2 tab for 1 week then increase to 1 tab every evening 2)  Follow up in 4 weeks--3/30 at 11am Prescriptions: AMLODIPINE BESYLATE 5 MG TABS (AMLODIPINE BESYLATE) Take 1/2 tab daily for 1 week then increase to 1 tab at bedtime  #30 x 3   Entered by:   Meredith Staggers, RN   Authorized by:   Talitha Givens, MD, [facility]   Signed by:   Meredith Staggers, RN on 05/26/2009   Method used:   Electronically to         [facility] (retail)       [address]       [place], [place]  [address]       Ph: [phone]       Fax: [phone]   RxID:   [id number]

## 2010-04-20 NOTE — Assessment & Plan Note (Signed)
Summary: FLU SHOT//PH   Nurse Visit   Allergies: 1)  ! Pcn  Orders Added: 1)  Flu Vaccine 80yrs + MEDICARE PATIENTS [Q2039] 2)  Administration Flu vaccine - MCR [G0008] Flu Vaccine Consent Questions     Do you have a history of severe allergic reactions to this vaccine? no    Any prior history of allergic reactions to egg and/or gelatin? no    Do you have a sensitivity to the preservative Thimersol? no    Do you have a past history of Guillan-Barre Syndrome? no    Do you currently have an acute febrile illness? no    Have you ever had a severe reaction to latex? no    Vaccine information given and explained to patient? yes    Are you currently pregnant? no    Lot Number:[id number]   Exp Date:09/18/2010   Site Given  Left Deltoid IM

## 2010-04-20 NOTE — Miscellaneous (Signed)
Summary: [facility] Orders  [facility] Orders   Imported By: Kassie Mends 03/24/2009 10:03:05  _____________________________________________________________________  External Attachment:    Type:   Image     Comment:   External Document

## 2010-04-20 NOTE — Assessment & Plan Note (Signed)
Summary: f21m      Allergies Added:   Visit Type:  Follow-up Referring Provider:  Barnett Abu, MD  Malon Kindle, MD  Richard Ramos,MD  Dr. Danie Binder Neurologic Primary Provider:  [name] MD  CC:  CAD.  History of Present Illness: The patient is seen for followup of coronary artery disease.  He is not having any chest pain.  Unfortunately he still has problems with ambulation.  He has had nerve conduction studies with Dr.Ramos.  A copy of Dr. Ethelene Hal' report has been sent to me.  He felt that the study was abnormal.  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  This appeared to affect the right lower limb more than the left.  There was no evidence of right lower limb lumbosacral radiculopathy or plexopathy based on needle EMG.  He felt that the patient had a multifactorial problem.  He noted that the patient was recovering from 4 level lumbar fusion which might be continuing to cause lower back pain.  The numbness in his legs could be coming from his back he could not exclude the sensory motor peripheral neuropathy.  Patient and his wife tell me that they will be planning to see neurology and then returning to Dr.Elsner.  The patient is limited.  He has begun an exercise program for his upper body.  Current Medications (verified): 1)  Adult Aspirin Ec Low Strength 81 Mg  Tbec (Aspirin) .Marland Kitchen.. 1 By Mouth Q Week 2)  Warfarin Sodium 2.5 Mg Tabs (Warfarin Sodium) .... Use As Directed By Anticoagualtion Clinic 3)  Metformin Hcl 500 Mg  Tabs (Metformin Hcl) .Marland Kitchen.. 1qd Wih Largest Meal 4)  Klor-Con M20 20 Meq Cr-Tabs (Potassium Chloride Crys Cr) .... Take One Tablet Daily 5)  Furosemide 40 Mg  Tabs (Furosemide) .... Take 1 Tablet By [facility] Times A Day 6)  Benazepril Hcl 10 Mg Tabs (Benazepril Hcl) .... Take 1/2  Tablet By Mouth Once A Day 7)  Accu-Chek Comfort Curve   Strp (Glucose Blood) .... Test M,w,f,sun and Check Blood  Sugars 2 Hours After Meal 8)  Accu-Chek Soft Touch Lancets   Misc (Lancets) .... Use As Directed 9)  Simvastatin 40 Mg Tabs (Simvastatin) .Marland Kitchen.. 1 At Bedtime, Appointment Due 10)  Glimepiride 2 Mg Tabs (Glimepiride) .... 1/2 Once Daily 11)  Carvedilol 25 Mg Tabs (Carvedilol) .... Take 1 Tab By Mouth Every Am and  2 Tabs Every Pm 12)  Nitrostat 0.4 Mg Subl (Nitroglycerin) .Marland Kitchen.. 1 Tablet Under Tongue At Onset of Chest Pain; You May Repeat Every 5 Minutes For Up To 3 Doses. 13)  Amlodipine Besylate 5 Mg Tabs (Amlodipine Besylate) .... Take 1 Tab At Supper 14)  Indomethacin 25 Mg Caps (Indomethacin) .... Take 1 Capsule By Mouth Three Times A Day As Needed 15)  Methocarbamol 500 Mg Tabs (Methocarbamol) .Marland Kitchen.. 1-3 Times Daily  Allergies (verified): 1)  ! Pcn  Past History:  Past Medical History: Refer also to the Past Medical History on my note of 06/17/2009 for more details ATRIAL FIBRILLATION (ICD-427.31) AUTOMATIC IMPLANTABLE CARDIAC DEFIBRILLATOR SITU (ICD-V45.02) ACUTE ON CHRONIC SYSTOLIC HEART FAILURE (ICD-428.23) CORONARY ARTERY DISEASE (ICD-414.00) CORONARY ARTERY BYPASS GRAFT, HX OF (ICD-V45.81) HYPERTENSION, ESSENTIAL NOS (ICD-401.9) HYPERLIPIDEMIA NEC/NOS (ICD-272.4) ABDOMINAL AORTIC ANEURYSM REPAIR, HX OF (ICD-V15.1) FLUID OVERLOAD (ICD-276.6) * STATUS POST LONG-TERM AMIODARONE THERAPY EVENTUALLY STOPPED HYPOTENSION (ICD-458.9) CLAUDICATION (ICD-443.9)  Doppler..... 2005...Marland KitchenMarland Kitchen no significant flow abnormalities in the legs DIABETES MELLITUS, CONTROLLED (ICD-250.00) CHRONIC KIDNEY DISEASE STAGE  II (MILD) (ICD-585.2) GOUT, HX OF (ICD-V12.2) * CLEARANCE FOR COLONOSCOPY CHRONIC RHINITIS (ICD-472.0) BACK PAIN, LUMBAR (ICD-724.2) HYPERPLASIA, PRST NOS W/O URINARY OBST/LUTS (ICD-600.90) COLONIC POLYPS (ICD-211.3) PILONIDAL CYSTECTOMY (ICD-685.1) GOUT (ICD-274.9) DVT (ICD-453.40) DIVERTICULOSIS, COLON (ICD-562.10)        Review of Systems       Patient denies fever, chills,  headache, sweats, rash, change in vision, change in hearing, chest pain, cough, shortness of breath, nausea vomiting, urinary symptoms.  All other systems are reviewed and are negative.  Vital Signs:  Patient profile:   70 year old male Weight:      248 pounds Pulse rate:   85 / minute BP sitting:   122 / 68  (left arm)  Vitals Entered By: Hardin Negus, RMA (December 30, 2009 1:54 PM)  Physical Exam  General:  patient is stable today. Head:  head is atraumatic. Eyes:  no xanthelasma. Neck:  no jugular venous distention. Chest Wall:  no chest wall tenderness. Lungs:  lungs are clear.  Respiratory effort is not labored. Heart:  cardiac exam reveals S1 and S2.  There no clicks or significant murmurs. Abdomen:  abdomen is soft. Msk:  no musculoskeletal deformities. Extremities:  no peripheral edema. Skin:  his skin still has a grayish tan from his prior amiodarone therapy. Psych:  patient is oriented to person time and place.  Affect is normal.    ICD Specifications Following MD:  [name], MD     Referring MD:  KATZ ICD Vendor:  Medtronic     ICD Model Number:  [phone]     ICD Serial Number:  [id number] ICD DOI:  04/11/2007     ICD Implanting MD:  [name], MD  Lead 1:    Location: RA     DOI: 04/11/2007     Model #: [id number]     Serial #: [id number]     Status: active Lead 2:    Location: RV     DOI: 04/11/2007     Model #: [id number]     Serial #: [id number]     Status: active Lead 3:    Location: LV     DOI: 04/11/2007     Model #: [id number]     Serial #: [id number]     Status: active  Indications::  ICM, CHF   ICD Follow Up ICD Dependent:  No       ICD Device Measurements Configuration: LV TIP TO RV COIL  Episodes Coumadin:  Yes  Brady Parameters Mode DDDR     Lower Rate Limit:  60     Upper Rate Limit 130 PAV 200     Sensed AV Delay:  180  Tachy Zones VF:  200     VT:  250 FVT VIA VF     VT1:  176     Impression & Recommendations:  Problem # 1:  ATRIAL  FIBRILLATION (ICD-427.31)  His updated medication list for this problem includes:    Adult Aspirin Ec Low Strength 81 Mg Tbec (Aspirin) .Marland Kitchen... 1 by mouth q week    Warfarin Sodium 2.5 Mg Tabs (Warfarin sodium) ..... Use as directed by anticoagualtion clinic    Carvedilol 25 Mg Tabs (Carvedilol) .Marland Kitchen... Take 1 tab by mouth every am and  2 tabs every pm The patient has atrial fibrillation.  In the past he was on amiodarone and received multiple shocks.  Amiodarone was stopped in September, 2009.  The patient has had atrial flutter at times also.  This is felt to be left-sided and therefore could not be ablated.  No further workup at this time.  Problem # 2:  AUTOMATIC IMPLANTABLE CARDIAC DEFIBRILLATOR SITU (ICD-V45.02) The patient's ICD is in place.  It is my understanding that this is a biventricular pacemaker also.  No further workup at this time.  Problem # 3:  ACUTE ON CHRONIC SYSTOLIC HEART FAILURE (ICD-428.23)  His updated medication list for this problem includes:    Adult Aspirin Ec Low Strength 81 Mg Tbec (Aspirin) .Marland Kitchen... 1 by mouth q week    Warfarin Sodium 2.5 Mg Tabs (Warfarin sodium) ..... Use as directed by anticoagualtion clinic    Furosemide 40 Mg Tabs (Furosemide) .Marland Kitchen... Take 1 tablet by mouthtwo-three times a day    Benazepril Hcl 10 Mg Tabs (Benazepril hcl) .Marland Kitchen... Take 1/2  tablet by mouth once a day    Carvedilol 25 Mg Tabs (Carvedilol) .Marland Kitchen... Take 1 tab by mouth every am and  2 tabs every pm    Nitrostat 0.4 Mg Subl (Nitroglycerin) .Marland Kitchen... 1 tablet under tongue at onset of chest pain; you may repeat every 5 minutes for up to 3 doses.    Amlodipine Besylate 5 Mg Tabs (Amlodipine besylate) .Marland Kitchen... Take 1 tab at supper The patient's volume status is stable.  There will be no change in his medications.  Problem # 4:  CORONARY ARTERY DISEASE (ICD-414.00)  His updated medication list for this problem includes:    Adult Aspirin Ec Low Strength 81 Mg Tbec (Aspirin) .Marland Kitchen... 1 by mouth q week     Warfarin Sodium 2.5 Mg Tabs (Warfarin sodium) ..... Use as directed by anticoagualtion clinic    Benazepril Hcl 10 Mg Tabs (Benazepril hcl) .Marland Kitchen... Take 1/2  tablet by mouth once a day    Carvedilol 25 Mg Tabs (Carvedilol) .Marland Kitchen... Take 1 tab by mouth every am and  2 tabs every pm    Nitrostat 0.4 Mg Subl (Nitroglycerin) .Marland Kitchen... 1 tablet under tongue at onset of chest pain; you may repeat every 5 minutes for up to 3 doses.    Amlodipine Besylate 5 Mg Tabs (Amlodipine besylate) .Marland Kitchen... Take 1 tab at supper Coronary disease is stable.  He was In July, 2008.  Grafts were patent including the LIMA.  He had low cardiac output at that time.no further workup.  Problem # 5:  HYPERTENSION, ESSENTIAL NOS (ICD-401.9)  His updated medication list for this problem includes:    Adult Aspirin Ec Low Strength 81 Mg Tbec (Aspirin) .Marland Kitchen... 1 by mouth q week    Furosemide 40 Mg Tabs (Furosemide) .Marland Kitchen... Take 1 tablet by mouthtwo-three times a day    Benazepril Hcl 10 Mg Tabs (Benazepril hcl) .Marland Kitchen... Take 1/2  tablet by mouth once a day    Carvedilol 25 Mg Tabs (Carvedilol) .Marland Kitchen... Take 1 tab by mouth every am and  2 tabs every pm    Amlodipine Besylate 5 Mg Tabs (Amlodipine besylate) .Marland Kitchen... Take 1 tab at supper The patient has had an unusual blood pressure pattern.  We have finally found her regimen and appears to work well.  He brings his pressures to me for evaluation.  In general his pressure is controlled.  At times he has some increased pressure first thing in the morning.  No further change in his meds at this time.  Problem # 6:  BACK PAIN, LUMBAR (ICD-724.2) Unfortunately the patient continues to not be able to increase his exercise capacity.  He has seen multiple physicians.  There  is question of some neuropathy.  He will be seeing neurology next and then he'll return to Dr. Danielle Dess.  I've encouraged the patient's wife to obtain copies of records from all of the doctors so that neurology will have the benefit of this  information.  The patient saw Dr.Dickson of vascular surgery today.  I do not have the report yet but I am told that it is felt that the patient's problem is not vascular this time.  Patient Instructions: 1)  Follow up in 3 months Prescriptions: BENAZEPRIL HCL 10 MG TABS (BENAZEPRIL HCL) Take 1/2  tablet by mouth once a day  #45 x 3   Entered by:   Meredith Staggers, RN   Authorized by:   Talitha Givens, MD, [facility]   Signed by:   Meredith Staggers, RN on 12/30/2009   Method used:   Electronically to        [facility] (retail)       [address]       [place], [place]  [address]       Ph: [phone]       Fax: [phone]   RxID:   [id number]

## 2010-04-20 NOTE — Letter (Signed)
Summary: Handout Printed  Printed Handout:  - Coumadin Instructions-w/out Meds 

## 2010-04-20 NOTE — Assessment & Plan Note (Signed)
Summary: icd check.[email or web address].[email or web address]  Medications Added CARVEDILOL 25 MG TABS (CARVEDILOL) Take 1 tab by mouth every AM and  2 tabs every PM      Allergies Added:   Visit Type:  Follow-up Referring Provider:  n/a Primary Provider:  [name] MD   History of Present Illness: Mr. Conroy returns today for ICD followup.  He is a pleasant middle aged man with an ICM, CHF (EF 25%), class 2 who is s/p ICD.  He has persistent atrial fib/flutter and has had trouble with CHF symptoms in the past.  With aggressive medical therapy under the direction of Dr. Myrtis Ser, he is improved.  He denies c/p.  No intercurrent ICD therapies and no peripheral edema.  Current Medications (verified): 1)  Adult Aspirin Ec Low Strength 81 Mg  Tbec (Aspirin) .Marland Kitchen.. 1 By Mouth Q Week 2)  Warfarin Sodium 2.5 Mg Tabs (Warfarin Sodium) .... Use As Directed By Anticoagualtion Clinic 3)  Metformin Hcl 500 Mg  Tabs (Metformin Hcl) .Marland Kitchen.. 1qd Wih Largest Meal 4)  Klor-Con M20 20 Meq Cr-Tabs (Potassium Chloride Crys Cr) .... Take One Tablet Daily 5)  Furosemide 40 Mg  Tabs (Furosemide) .... Take 1 Tablet By [facility] Times A Day 6)  Benazepril Hcl 10 Mg Tabs (Benazepril Hcl) .... Take 1/2  Tablet By Mouth Once A Day 7)  Accu-Chek Comfort Curve   Strp (Glucose Blood) .... Test M,w,f,sun and Check Blood Sugars 2 Hours After Meal 8)  Accu-Chek Soft Touch Lancets   Misc (Lancets) .... Use As Directed 9)  Simvastatin 40 Mg Tabs (Simvastatin) .Marland Kitchen.. 1 At Bedtime, Appointment Due 10)  Glimepiride 2 Mg Tabs (Glimepiride) .... 1/2 Once Daily 11)  Carvedilol 25 Mg Tabs (Carvedilol) .... Take 1 Tab By Mouth Every Am and  2 Tabs Every Pm 12)  Nitrostat 0.4 Mg Subl (Nitroglycerin) .Marland Kitchen.. 1 Tablet Under Tongue At Onset of Chest Pain; You May Repeat Every 5 Minutes For Up To 3 Doses. 13)  Amlodipine Besylate 5 Mg Tabs (Amlodipine Besylate) .... Take 1 Tab At Supper  Allergies (verified): 1)  ! Pcn  Past History:  Past Medical History: Last  updated: 03/11/2009 Diabetes mellitus, type II Hyperlipidemia Hypertension penicillin allergy  pilonidal cyst Atrial fibrillation.... long-term high-dose amiodarone therapy multiple cardioversions.Marland Kitchen amiodarone stopped September 2009 Atrial flutter present... February 02, 2009 Left atrial clot but cardioversions done since that time. Claudication significant peripheral disease in the legs and question mesenteric disease Low back pain from spinal stenosis, DDD....surgery... Dr. Danielle Dess CAD  .Marland KitchenMarland KitchenCatheterization July 2008 vein grafts and LIMA patent..... 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 Georganna Skeans infected leg after bypass treated Nasal drainage  ...chronic Skin discoloration from amiodarone and easy bruisability from Coumadin and aspirin TSH.... borderline low Excess beer consumption.... but not a true alcohol problem Renal insufficiency creatinine 1.2-1.7 range ICD placement 2009 by Dr. Ladona Ridgel also biventricular pacing.... flutter could not be ablated as it is left sided. Chronotropic incompetence pacer rate adjusted June 09 with improvement. 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           Past Surgical  History: Last updated: 06/10/2009 Colon polypectomy 2004 ,Dr Juanda Chance (due 05/2008) Abdominal aortic aneurysm repair 2004 Coronary artery bypass graft 2004 Cervical X stops Defibrillator 01/09 Lumbar fusion with rods , screws 10/02/2008 , Dr  Danielle Dess pilonidal cyst removal Surgery on the scrotum for varicose veins as a child.  Review of Systems  The patient denies chest pain, syncope, dyspnea on exertion, and peripheral edema.    Vital Signs:  Patient profile:   70 year old male Height:      72 inches Weight:      238 pounds BMI:     32.40 Pulse rate:   64 / minute BP sitting:   104 / 70  (left arm)  Vitals Entered By: Laurance Flatten CMA (Aug 04, 2009 10:58 AM)  Physical Exam  General:  he is quite stable in general. Head:  head is atraumatic. Eyes:  no xanthelasma. Mouth:  there is no abnormality of the mouth at this time. Neck:  no JVD. Chest Wall:  Well healed ICD incision. Lungs:  lungs are clear respiratory effort is nonlabored. Heart:  cardiac exam reveals S1 and S2.  No clicks or significant murmurs. Abdomen:  abdomen is protuberant but soft. Msk:  no musculoskeletal deformities. Pulses:  R and L carotid,radial  pulses are full and equal bilaterally. Decreased pedal pulses. Extremities:  no peripheral edema. Neurologic:  Alert and oriented x 3.    ICD Specifications Following MD:  [name], MD     Referring MD:  KATZ ICD Vendor:  [employer]tronic     ICD Model Number:  [phone]     ICD Serial Number:  [id number] ICD DOI:  04/11/2007     ICD Implanting MD:  [name], MD  Lead 1:    Location: RA     DOI: 04/11/2007     Model #: [id number]     Serial #: [id number]     Status: active Lead 2:    Location: RV     DOI: 04/11/2007     Model #: [id number]     Serial #: [id number]     Status: active Lead 3:    Location: LV     DOI: 04/11/2007     Model #: [id number]     Serial #: [id number]     Status: active  Indications::  ICM, CHF   ICD Follow Up Remote Check?  No Battery Voltage:  2.93 V     Charge Time:  10.7 seconds     Underlying rhythm:  A-fib ICD Dependent:  No       ICD Device Measurements Atrium:  Amplitude: 2.0 mV, Impedance: 504 ohms,  Right Ventricle:  Amplitude: 11.3 mV, Impedance: 512 ohms, Threshold: 1.0 V  at 0.4 msec Left Ventricle:  Impedance: 512 ohms, Threshold: 2.5 V at 0.9 msec Configuration: LV TIP TO RV COIL  Episodes MS Episodes:  1     Percent Mode Switch:  100%     Coumadin:  Yes Shock:  0     ATP:  0     Nonsustained:  0     Atrial Pacing:  0.4%     Ventricular Pacing:  65.5%  Brady Parameters Mode DDDR     Lower Rate Limit:  60     Upper Rate Limit 130 PAV 200     Sensed AV Delay:  180  Tachy Zones VF:  200     VT:  250 FVT VIA VF     VT1:  176  Next Remote Date:  11/05/2009     Next Cardiology Appt Due:  07/20/2010 Tech Comments:  No parameter changes.  A-fib 100%, coumadin is on hold for 1 week post colonoscopy.  He will resurme 5/18.   Optivol and thoracic impedance normal.  Carelink transmissions every 3 months.  ROV with Dr. Ladona Ridgel in 1 year. Altha Harm, LPN  Aug 04, 2009 10:59 AM  MD Comments:  Agree with above.  Impression & Recommendations:  Problem # 1:  AUTOMATIC IMPLANTABLE CARDIAC DEFIBRILLATOR SITU (ICD-V45.02) His device is working normally today and his activity is improved.  Will recheck his device in several weeks.  Problem # 2:  ACUTE ON CHRONIC SYSTOLIC HEART FAILURE (ICD-428.23) He has improved to class 2.  Continue meds as below and maintain a low sodium diet. His updated medication list for this problem includes:    Adult Aspirin Ec Low Strength 81 Mg Tbec (Aspirin) .Marland Kitchen... 1 by mouth q week    Warfarin Sodium 2.5 Mg Tabs (Warfarin sodium) ..... Use as directed by anticoagualtion clinic    Furosemide 40 Mg Tabs (Furosemide) .Marland Kitchen... Take 1 tablet by mouthtwo-three times a day    Benazepril Hcl 10 Mg Tabs (Benazepril hcl) .Marland Kitchen... Take 1/2  tablet by mouth once a day    Carvedilol 25 Mg Tabs (Carvedilol) .Marland Kitchen... Take 1 tab by mouth every am and  2 tabs every pm    Nitrostat 0.4 Mg Subl (Nitroglycerin) .Marland Kitchen... 1 tablet under tongue at onset of chest pain; you may repeat every 5 minutes for up to 3 doses.    Amlodipine Besylate 5 Mg Tabs (Amlodipine besylate)  .Marland Kitchen... Take 1 tab at supper  Problem # 3:  HYPERTENSION, ESSENTIAL NOS (ICD-401.9) His blood pressure is fairly well controlled.  Continue meds as below. His updated medication list for this problem includes:    Adult Aspirin Ec Low Strength 81 Mg Tbec (Aspirin) .Marland Kitchen... 1 by mouth q week    Furosemide 40 Mg Tabs (Furosemide) .Marland Kitchen... Take 1 tablet by mouthtwo-three times a day    Benazepril Hcl 10 Mg Tabs (Benazepril hcl) .Marland Kitchen... Take 1/2  tablet by mouth once a day    Carvedilol 25 Mg Tabs (Carvedilol) .Marland Kitchen... Take 1 tab by mouth every am and  2 tabs every pm    Amlodipine Besylate 5 Mg Tabs (Amlodipine besylate) .Marland Kitchen... Take 1 tab at supper  Patient Instructions: 1)  Your physician recommends that you schedule a follow-up appointment in: 12 months with Dr Ladona Ridgel

## 2010-04-20 NOTE — Medication Information (Signed)
Summary: rov/ewj   Anticoagulant Therapy  Managed by: Weston Brass, PharmD Referring MD: [name] MD PCP: [name] MD Supervising MD: Daleen Squibb MD, Maisie Fus Indication 1: Atrial Fibrillation (ICD-427.31) Lab Used: [facility] Site: [facility] INR POC 1.5 INR RANGE 2 - 3  Dietary changes: no    Health status changes: no    Bleeding/hemorrhagic complications: no    Recent/future hospitalizations: no    Any changes in medication regimen? yes       Details: recent colonoscopy--has been off coumadin x2weeks--restarted coumadin this past week  Recent/future dental: no  Any missed doses?: yes     Details: recent colonoscopy  Is patient compliant with meds? yes       Allergies: 1)  ! Pcn  Anticoagulation Management History:      The patient is taking warfarin and comes in today for a routine follow up visit.  Positive risk factors for bleeding include an age of 69 years or older and presence of serious comorbidities.  Negative risk factors for bleeding include no history of CVA/TIA.  The bleeding index is 'intermediate risk'.  Positive CHADS2 values include History of CHF, History of HTN, and History of Diabetes.  Negative CHADS2 values include Age > 69 years old and Prior Stroke/CVA/TIA.  The start date was 02/15/2002.  His last INR was 1.6 RATIO.  Anticoagulation responsible provider: Daleen Squibb MD, Maisie Fus.  INR POC: 1.5.  Cuvette Lot#: [id number].  Exp: 08/12.    Anticoagulation Management Assessment/Plan:      The patient's current anticoagulation dose is Warfarin sodium 2.5 mg tabs: Use as directed by Anticoagualtion Clinic.  The target INR is 2 - 3.  The next INR is due 08/24/2009.  Anticoagulation instructions were given to patient.  Results were reviewed/authorized by Weston Brass, PharmD.  He was notified by Ledon Snare, RN.         Prior Anticoagulation Instructions: INR 2.4  Continue on same dosage 2.5mg  daily except 5mg  on Wednesdays and Saturdays.  Recheck in 4 weeks.     Current Anticoagulation Instructions: INR 1.5  Take 2 tablets today and tomorrow then resume same dose of 1 tablet every day except 2 tablets on Wednesday and Saturday

## 2010-04-20 NOTE — Assessment & Plan Note (Signed)
Summary: f3d      Allergies Added:   Visit Type:  3 day foolow up Referring Provider:  Barnett Abu, MD  Malon Kindle, MD  Richard Ramos,MD  Dr. Danie Binder Neurologic Primary Provider:  [name] MD  CC:  shortness of breath.  History of Present Illness: The patient is seen today for cardiology followup.  I saw him last February 09, 2010.  At that time he had worsening shortness of breath.  He had some edema and decreased breath sounds in his left lung base.  I pushed his diuretic dose up.  Later that day chest x-ray results came back showing a left pleural effusion.  He has diarrhea 6 pounds since his last visit.  He is not feeling better.  His blood pressure is low today.  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) 5)  Preventive Health Care  (ICD-V70.0) 6)  Peripheral Neuropathy  (ICD-356.9) 7)  Vitamin D Deficiency  (ICD-268.9) 8)  Atrial Fibrillation  (ICD-427.31) 9)  Automatic Implantable Cardiac Defibrillator Situ  (ICD-V45.02) 10)  Acute On Chronic Systolic Heart Failure  (ICD-428.23) 11)  Coronary Artery Disease  (ICD-414.00) 12)  Coronary Artery Bypass Graft, Hx of  (ICD-V45.81) 13)  Hypertension, Essential Nos  (ICD-401.9) 14)  Hyperlipidemia Nec/nos  (ICD-272.4) 15)  Abdominal Aortic Aneurysm Repair, Hx of  (ICD-V15.1) 16)  Fluid Overload  (ICD-276.6) 17)  Status Post Long-term Amiodarone Therapy Eventually Stopped  () 18)  Hypotension  (ICD-458.9) 19)  Claudication  (ICD-443.9) 20)  Diabetes Mellitus, Controlled  (ICD-250.00) 21)  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 1 By Mouth Q Week 2)  Warfarin Sodium 2.5 Mg Tabs (Warfarin Sodium) .... Use As Directed By Anticoagualtion Clinic 3)  Metformin Hcl 500 Mg  Tabs (Metformin Hcl) .Marland Kitchen.. 1qd Wih Largest Meal 4)  Klor-Con M20 20 Meq Cr-Tabs (Potassium Chloride Crys Cr) .... Take One Tablet Daily 5)  Furosemide 40 Mg  Tabs (Furosemide) .... 2 Tabs Two Times A Day 6)  Benazepril Hcl 10 Mg Tabs (Benazepril Hcl) .... Take 1/2  Tablet By Mouth Once A Day 7)  Accu-Chek Comfort Curve   Strp (Glucose Blood) .... Test M,w,f,sun and Check Blood Sugars 2 Hours After Meal 8)  Accu-Chek Soft Touch Lancets   Misc (Lancets) .... Use As Directed 9)  Simvastatin 40 Mg Tabs (Simvastatin) .Marland Kitchen.. 1 At Bedtime, Appointment Due 10)  Glimepiride 2 Mg Tabs (Glimepiride) .... 1/2 Once Daily 11)  Carvedilol 25 Mg Tabs (Carvedilol) .... Take 1 Tab By Mouth Every Am and  2 Tabs Every Pm 12)  Nitrostat 0.4 Mg Subl (Nitroglycerin) .Marland Kitchen.. 1 Tablet Under Tongue At Onset of Chest Pain; You May Repeat Every 5 Minutes For Up To 3 Doses. 13)  Amlodipine Besylate 5 Mg Tabs (Amlodipine Besylate) .... Take 1 Tab At [employer])  14)  Indomethacin 25 Mg Caps (Indomethacin) .... Take 1 Capsule By Mouth Three Times A Day As Needed 15)  Methocarbamol 500 Mg Tabs (Methocarbamol) .Marland Kitchen.. 1-3 Times Daily 16)  Gabapentin 100 Mg Caps (Gabapentin) .Marland Kitchen.. 1 Every 8 Hrs As Needed For Leg Pain 17)  Metolazone 2.5 Mg Tabs (Metolazone) .... Take One Tablet By Mouth Daily As Directed  Allergies (verified): 1)  ! Pcn  Past History:  Past Surgical History: Last updated: 08/03/2009 Colon polypectomy 2004 ,Dr Juanda Chance (due 05/2008) Abdominal aortic aneurysm repair 2004 Coronary artery bypass  graft 2004 Cervical X stops Defibrillator 01/09 Lumbar fusion with rods , screws 10/02/2008 , Dr Danielle Dess pilonidal cyst removal Surgery on the scrotum for varicose veins as a child.  Family History: Last updated: 01/07/2010 No strong family history of coronary disease. No FH of Colon Cancer: Father: DM, CAD Mother: HTN, CVA Siblings: neg  Social History: Last updated: 01/07/2010 Married Retired Drinks approximately  3-4  beers daily. Former Smoker: quit 1994  Risk Factors: Exercise: no (02/26/2008)  Risk Factors: Smoking Status: quit (03/11/2009)  Past Medical History: Diabetes mellitus, type II Hyperlipidemia Hypertension penicillin allergy  pilonidal cyst Atrial fibrillation.... long-term high-dose amiodarone therapy multiple cardioversions.Marland Kitchen amiodarone stopped September 2009 Atrial flutter present... February 02, 2009 Left atrial clot but cardioversions done since that time. Claudication significant peripheral disease in the legs and question mesenteric disease Low back pain from spinal stenosis, DDD....surgery... Dr. Danielle Dess CAD  .Marland KitchenMarland KitchenCatheterization July 2008 vein grafts and LIMA patent..... 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 Georganna Skeans infected leg after bypass treated Nasal drainage  ...chronic Skin discoloration from amiodarone and easy bruisability from Coumadin and aspirin TSH.... borderline low Excess beer consumption.... but not a true alcohol problem Renal insufficiency creatinine 1.2-1.7 range ICD placement  2009 by Dr. Ladona Ridgel also biventricular pacing.... flutter could not be ablated as it is left sided. Chronotropic incompetence pacer rate adjusted June 09 with improvement. 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 Shortness of breath    worsening recently.... November, 2011...chest x-ray with large left effusion        Review of Systems       Patient denies fever, chills, headache, sweats, rash, change in vision, change in hearing, chest pain, cough, nausea vomiting, urinary symptoms.  All other systems are reviewed and are negative.  Vital Signs:  Patient profile:   70 year old male Height:      72 inches Weight:      241 pounds BMI:     32.80 Pulse rate:   66 / minute BP sitting:   78 / 58  (left arm) Cuff size:   regular  Vitals Entered By: Caralee Ates CMA (February 12, 2010 11:27 AM)  Physical Exam  General:  patient looks tired and weak today. Head:  head is atraumatic. Eyes:  no xanthelasma. Neck:  no jugular venous distention. Chest Wall:  no chest wall tenderness. Lungs:  decreased breath sounds in the left lung base compatible with ongoing pleural effusion. Heart:  cardiac exam reveals an S1-S2.  No clicks or significant murmurs. Abdomen:  abdomen is protuberant as always but soft. Msk:  no musculoskeletal deformities. Extremities:  trace edema in the right lower extremity. Skin:  grayish tint from prior amiodarone use. Psych:  patient is oriented to person time and place.  Affect is normal.  He is here with his wife today.    ICD Specifications Following MD:  [name], MD     Referring MD:  KATZ ICD Vendor:  [employer]     ICD Model Number:  [id number]     ICD Serial Number:  [id number] ICD DOI:  04/11/2007     ICD Implanting MD:  [name], MD  Lead 1:    Location: RA     DOI: 04/11/2007     Model #: [id number]     Serial #: [id number]     Status: active Lead 2:    Location: RV      DOI: 04/11/2007     Model #: [id number]     Serial #: [id number]     Status: active Lead 3:    Location: LV     DOI: 04/11/2007     Model #: [id number]     Serial #: [id number]     Status: active  Indications::  ICM, CHF   ICD Follow Up ICD Dependent:  No       ICD Device Measurements Configuration: LV TIP TO RV COIL  Episodes Coumadin:  Yes  Brady Parameters Mode DDDR     Lower Rate Limit:  60     Upper Rate Limit 130 PAV 200     Sensed AV Delay:  180  Tachy Zones VF:  200     VT:  250 FVT VIA VF     VT1:  176     Impression & Recommendations:  Problem # 1:  ATRIAL FIBRILLATION (ICD-427.31) The patient has underlying atrial fibrillation 100% of the time.  Consideration will have to be given to what else we can do about his atrial rhythm.  Problem # 2:  AUTOMATIC IMPLANTABLE CARDIAC DEFIBRILLATOR SITU (ICD-V45.02) The patient's ICD was interrogated recently.  It is working appropriately.  It did show for a period of time that he was volume overloaded. He does have a biventricular pacemaker.  It would be optimal to return to sinus rhythm if possible.  Problem # 3:  CORONARY ARTERY DISEASE (ICD-414.00) Coronary disease is stable.  The patient had undergone repeat catheterization in July, 2008.  His vein grafts and LIMA were patent.  It was felt that he had chronotropic incompetence.  His pacemaker rate was adjusted for this in 2009.    Problem # 4:  HYPERTENSION, ESSENTIAL NOS (ICD-401.9) Blood pressure is low today.  We will hold his blood pressure medicines.  Problem # 5:  COUMADIN THERAPY (ICD-V58.61) The patient is on Coumadin.  This will affect her ability to proceed with a pleural tap. Because I believe the patient will need a pleural tap, I will give him vitamin K and arrange for heparin to be started as soon as his INR is less than 2.0.  Problem # 6:  PLEURAL EFFUSION, LEFT (ICD-511.9) The etiology of the patient's pleural effusion is not clear.  It may be from heart failure.  We  will have to rule out other etiologies.  We will start with repeat chest x-ray to see if there is been a change since the study a few days ago.  We will then decide if a pleural tap as appropriate.  The timing of this  will be based on his anticoagulation status.  Patient Instructions: 1)  Your physician recommends that you schedule a follow-up appointment in: ADMIT TO [facility] FOR HYPOTENSION AND PLEURAL EFFUSION 2)  Your physician recommends that you continue on your current medications as directed. Please refer to the Current Medication list given to you today.

## 2010-04-20 NOTE — Cardiovascular Report (Signed)
Summary: Office Visit   Office Visit   Imported By: Roderic Ovens 04/08/2009 14:00:43  _____________________________________________________________________  External Attachment:    Type:   Image     Comment:   External Document

## 2010-04-20 NOTE — Miscellaneous (Signed)
  Clinical Lists Changes  Observations: Added new observation of REFERRING MD: Barnett Abu, MD  Malon Kindle, MD  Richard Ramos,MD (12/29/2009 9:47) Added new observation of PRIMARY MD: [name] MD (12/29/2009 9:47)

## 2010-04-20 NOTE — Letter (Signed)
Summary: [facility] Office Note  [facility]   Imported By: Roderic Ovens 04/28/2009 13:41:38  _____________________________________________________________________  External Attachment:    Type:   Image     Comment:   External Document

## 2010-04-20 NOTE — Medication Information (Signed)
Summary: rov/tm   Anticoagulant Therapy  Managed by: Lyna Poser PharmD Referring MD: [name] MD PCP: [name] MD Supervising MD: Clifton James MD,Christopher Indication 1: Atrial Fibrillation (ICD-427.31) Lab Used: [facility] Site: [facility] INR POC 3.1 INR RANGE 2 - 3  Dietary changes: no    Health status changes: no    Bleeding/hemorrhagic complications: no    Recent/future hospitalizations: no    Any changes in medication regimen? no    Recent/future dental: no  Any missed doses?: no         Allergies: 1)  ! Pcn  Anticoagulation Management History:      The patient is taking warfarin and comes in today for a routine follow up visit.  Positive risk factors for bleeding include an age of 37 years or older and presence of serious comorbidities.  Negative risk factors for bleeding include no history of CVA/TIA.  The bleeding index is 'intermediate risk'.  Positive CHADS2 values include History of CHF, History of HTN, and History of Diabetes.  Negative CHADS2 values include Age > 24 years old and Prior Stroke/CVA/TIA.  The start date was 02/15/2002.  His last INR was 1.6 RATIO.  Anticoagulation responsible provider: Clifton James MD,Christopher.  INR POC: 3.1.  Cuvette Lot#: [id number].  Exp: 12/2010.    Anticoagulation Management Assessment/Plan:      The patient's current anticoagulation dose is Warfarin sodium 2.5 mg tabs: Use as directed by Anticoagualtion Clinic.  The target INR is 2 - 3.  The next INR is due 12/30/2009.  Anticoagulation instructions were given to patient.  Results were reviewed/authorized by Lyna Poser PharmD.         Prior Anticoagulation Instructions: INR 2.5 Continue 2.5mg s everyday except 5mg s on Wednesdays and Saturdays. Recheck in 4 weeks.   Current Anticoagulation Instructions: INR 3.1 Continue with same dosing schedule of 1 tablet on everyday except 2 tablets on wednesday and saturday. We'll see you in 4 weeks.

## 2010-04-20 NOTE — Progress Notes (Signed)
Summary: Schedule NP3 to Discuss Colonoscopy    Phone Note Outgoing Call Call back at [name] Phone [phone]   Call placed by: Harlow Mares CMA Duncan Dull),  April 06, 2009 9:33 AM Call placed to: Patient Summary of Call: patients number was busy I will try again later. Patient is on coumadin so he will need office visit to schedule colonscopy. Initial call taken by: Harlow Mares CMA Duncan Dull),  April 06, 2009 9:33 AM  Follow-up for Phone Call        patient had surgery and they go back to the MD in feb and after they follow up from that MD they will call our office and make an appt to discuss patient having a colonoscopy Follow-up by: Harlow Mares CMA Duncan Dull),  April 13, 2009 4:35 PM

## 2010-04-20 NOTE — Medication Information (Signed)
Summary: rov/tm  Anticoagulant Therapy  Managed by: Cloyde Reams, RN, BSN Referring MD: [name] MD PCP: [name] MD Supervising MD: Antoine Poche MD, Fayrene Fearing Indication 1: Atrial Fibrillation (ICD-427.31) Lab Used: [facility] Site: [facility] INR POC 2.0 INR RANGE 2 - 3  Dietary changes: no    Health status changes: no    Bleeding/hemorrhagic complications: no    Recent/future hospitalizations: no    Any changes in medication regimen? no    Recent/future dental: no  Any missed doses?: no       Is patient compliant with meds? yes       Allergies (verified): 1)  ! Pcn  Anticoagulation Management History:      The patient is taking warfarin and comes in today for a routine follow up visit.  Positive risk factors for bleeding include an age of 23 years or older and presence of serious comorbidities.  Negative risk factors for bleeding include no history of CVA/TIA.  The bleeding index is 'intermediate risk'.  Positive CHADS2 values include History of CHF, History of HTN, and History of Diabetes.  Negative CHADS2 values include Age > 80 years old and Prior Stroke/CVA/TIA.  The start date was 02/15/2002.  His last INR was 1.6 RATIO.  Anticoagulation responsible provider: Antoine Poche MD, Fayrene Fearing.  INR POC: 2.0.  Cuvette Lot#: [id number].  Exp: 04/2010.    Anticoagulation Management Assessment/Plan:      The patient's current anticoagulation dose is Warfarin sodium 2.5 mg tabs: Use as directed by Anticoagualtion Clinic.  The target INR is 2 - 3.  The next INR is due 04/24/2009.  Anticoagulation instructions were given to patient.  Results were reviewed/authorized by Cloyde Reams, RN, BSN.  He was notified by Cloyde Reams RN.         Prior Anticoagulation Instructions: INR 2.2 Continue 2.5mg s daily except 5mg s on Wednesdays. Recheck in 4 weeks.   Current Anticoagulation Instructions: INR 2.0  Continue on same dosage 1 tablet daily except 2 tablets on Wednesdays.   Recheck in  4 weeks.

## 2010-04-20 NOTE — Medication Information (Signed)
Summary: rov/tm  Anticoagulant Therapy  Managed by: Leota Sauers, PharmD, BCPS, CPP Referring MD: [name] MD PCP: [name] MD Supervising MD: Eden Emms MD, Theron Arista Indication 1: Atrial Fibrillation (ICD-427.31) Lab Used: [facility] Site: [facility] INR POC 3.1 INR RANGE 2 - 3  Dietary changes: no    Health status changes: no    Bleeding/hemorrhagic complications: no    Recent/future hospitalizations: no    Any changes in medication regimen? yes       Details: new gabapentin, took indomethacin 4 days last week  Recent/future dental: no  Any missed doses?: no       Is patient compliant with meds? yes       Current Medications (verified): 1)  Adult Aspirin Ec Low Strength 81 Mg  Tbec (Aspirin) .Marland Kitchen.. 1 By Mouth Q Week 2)  Warfarin Sodium 2.5 Mg Tabs (Warfarin Sodium) .... Use As Directed By Anticoagualtion Clinic 3)  Metformin Hcl 500 Mg  Tabs (Metformin Hcl) .Marland Kitchen.. 1qd Wih Largest Meal 4)  Klor-Con M20 20 Meq Cr-Tabs (Potassium Chloride Crys Cr) .... Take One Tablet Daily 5)  Furosemide 40 Mg  Tabs (Furosemide) .... Take 1 Tablet By [facility] Times A Day 6)  Benazepril Hcl 10 Mg Tabs (Benazepril Hcl) .... Take 1/2  Tablet By Mouth Once A Day 7)  Accu-Chek Comfort Curve   Strp (Glucose Blood) .... Test M,w,f,sun and Check Blood Sugars 2 Hours After Meal 8)  Accu-Chek Soft Touch Lancets   Misc (Lancets) .... Use As Directed 9)  Simvastatin 40 Mg Tabs (Simvastatin) .Marland Kitchen.. 1 At Bedtime, Appointment Due 10)  Glimepiride 2 Mg Tabs (Glimepiride) .... 1/2 Once Daily 11)  Carvedilol 25 Mg Tabs (Carvedilol) .... Take 1 Tab By Mouth Every Am and  2 Tabs Every Pm 12)  Nitrostat 0.4 Mg Subl (Nitroglycerin) .Marland Kitchen.. 1 Tablet Under Tongue At Onset of Chest Pain; You May Repeat Every 5 Minutes For Up To 3 Doses. 13)  Amlodipine Besylate 5 Mg Tabs (Amlodipine Besylate) .... Take 1 Tab At Supper 14)  Indomethacin 25 Mg Caps (Indomethacin) .... Take 1 Capsule By Mouth Three Times A Day As  Needed 15)  Methocarbamol 500 Mg Tabs (Methocarbamol) .Marland Kitchen.. 1-3 Times Daily 16)  Gabapentin 100 Mg Caps (Gabapentin) .Marland Kitchen.. 1 Every 8 Hrs As Needed For Leg Pain  Allergies: 1)  ! Pcn  Anticoagulation Management History:      The patient is taking warfarin and comes in today for a routine follow up visit.  Positive risk factors for bleeding include an age of 49 years or older and presence of serious comorbidities.  Negative risk factors for bleeding include no history of CVA/TIA.  The bleeding index is 'intermediate risk'.  Positive CHADS2 values include History of CHF, History of HTN, and History of Diabetes.  Negative CHADS2 values include Age > 30 years old and Prior Stroke/CVA/TIA.  The start date was 02/15/2002.  His last INR was 1.6 RATIO.  Anticoagulation responsible provider: Eden Emms MD, Theron Arista.  INR POC: 3.1.  Cuvette Lot#: [id number].  Exp: 01/2011.    Anticoagulation Management Assessment/Plan:      The patient's current anticoagulation dose is Warfarin sodium 2.5 mg tabs: Use as directed by Anticoagualtion Clinic.  The target INR is 2 - 3.  The next INR is due 02/24/2010.  Anticoagulation instructions were given to patient.  Results were reviewed/authorized by Leota Sauers, PharmD, BCPS, CPP.         Prior Anticoagulation Instructions: INR 2.5 Continue 2.5mg s daily except 5mg s  on Wednesdays and Saturdays. Recheck in 4 weeks.    Current Anticoagulation Instructions: INR 3.1  Coumadin 2.5mg  tabs, take only 1 tab today 11/9 then 1 tab each day except 2 tabs on Wed and Sat

## 2010-04-20 NOTE — Letter (Signed)
Summary: [facility] Instructions  [facility]  [address] [place], [place] [address]   Phone: [phone]  Fax: [phone]       [name]    July 06, 1940    MRN: [id number]       Procedure Day /Date: 07/29/09 Wednesday     Arrival Time: 7:30 am     Procedure Time: 8:00 am     Location of Procedure:                    _x _  [facility] (4th Floor)  PREPARATION FOR COLONOSCOPY WITH MIRALAX  Starting 5 days prior to your procedure 07/24/09 do not eat nuts, seeds, popcorn, corn, beans, peas,  salads, or any raw vegetables.  Do not take any fiber supplements (e.g. Metamucil, Citrucel, and Benefiber). ____________________________________________________________________________________________________   THE DAY BEFORE YOUR PROCEDURE         DATE: 07/28/09 DAY: Tuesday  1   Drink clear liquids the entire day-NO SOLID FOOD  2   Do not drink anything colored red or purple.  Avoid juices with pulp.  No orange juice.  3   Drink at least 64 oz. (8 glasses) of fluid/clear liquids during the day to prevent dehydration and help the prep work efficiently.  CLEAR LIQUIDS INCLUDE: Water Jello Ice Popsicles Tea (sugar ok, no milk/cream) Powdered fruit flavored drinks Coffee (sugar ok, no milk/cream) Gatorade Juice: apple, white grape, white cranberry  Lemonade Clear bullion, consomm, broth Carbonated beverages (any kind) Strained chicken noodle soup Hard Candy  4   Mix the entire bottle of Miralax with 64 oz. of Gatorade/Powerade in the morning and put in the refrigerator to chill.  5   At 3:00 pm take 2 Dulcolax/Bisacodyl tablets.  6   At 4:30 pm take one Reglan/Metoclopramide tablet.  7  Starting at 5:00 pm drink one 8 oz glass of the Miralax mixture every 15-20 minutes until you have finished drinking the entire 64 oz.  You should finish drinking prep around 7:30 or 8:00 pm.  8   If you are nauseated, you may take the 2nd Reglan/Metoclopramide  tablet at 6:30 pm.        9    At 8:00 pm take 2 more DULCOLAX/Bisacodyl tablets.         THE DAY OF YOUR PROCEDURE      DATE:  07/29/09 DAY: Wednesday  You may drink clear liquids until 6:00 am  (2 HOURS BEFORE PROCEDURE).   MEDICATION INSTRUCTIONS  Unless otherwise instructed, you should take regular prescription medications with a small sip of water as early as possible the morning of your procedure.  Please discontinue your Coumadin/warfarin 5 days prior (07/24/09) to your scheduled procedure date per Dr. Lina Sar.        OTHER INSTRUCTIONS  You will need a responsible adult at least 69 years of age to accompany you and drive you home.   This person must remain in the waiting room during your procedure.  Wear loose fitting clothing that is easily removed.  Leave jewelry and other valuables at home.  However, you may wish to bring a book to read or an iPod/MP3 player to listen to music as you wait for your procedure to start.  Remove all body piercing jewelry and leave at home.  Total time from sign-in until discharge is approximately 2-3 hours.  You should go home directly after your procedure and rest.  You can resume normal activities the  day after your procedure.  The day of your procedure you should not:   Drive   Make legal decisions   Operate machinery   Drink alcohol   Return to work  You will receive specific instructions about eating, activities and medications before you leave.   The above instructions have been reviewed and explained to me by  [name] CMA Duncan Dull)  June 16, 2009 10:05 AM     I fully understand and can verbalize these instructions _____________________________ Date 06/16/09

## 2010-04-20 NOTE — Cardiovascular Report (Signed)
Summary: Office Visit Remote   Office Visit Remote   Imported By: Roderic Ovens 12/08/2009 13:54:54  _____________________________________________________________________  External Attachment:    Type:   Image     Comment:   External Document

## 2010-04-20 NOTE — Miscellaneous (Signed)
  Medications Added ACCU-CHEK COMFORT CURVE   STRP (GLUCOSE BLOOD) test m,w,f,sun and check blood sugars 2 hours after meal       Clinical Lists Changes  Medications: Changed medication from ACCU-CHEK COMFORT CURVE   STRP (GLUCOSE BLOOD) use as directed to ACCU-CHEK COMFORT CURVE   STRP (GLUCOSE BLOOD) test m,w,f,sun and check blood sugars 2 hours after meal

## 2010-04-20 NOTE — Cardiovascular Report (Signed)
Summary: Office Visit   Office Visit   Imported By: Roderic Ovens 08/21/2009 10:36:09  _____________________________________________________________________  External Attachment:    Type:   Image     Comment:   External Document

## 2010-04-20 NOTE — Assessment & Plan Note (Signed)
Summary: f81m  Medications Added CARVEDILOL 25 MG TABS (CARVEDILOL) Take 1 tab by mouth every AM and  2 tabs every PM      Allergies Added: (  Visit Type:  Follow-up Primary Provider:  [name] MD  CC:  hypertension.  History of Present Illness: Patient is seen for followup of hypertension and coronary disease and left ventricular dysfunction. He continues to improve from his back surgery.  His shortness of breath has greatly improved although he has some.  He is gaining weight and we discussed this at length.  Current Medications (verified): 1)  Adult Aspirin Ec Low Strength 81 Mg  Tbec (Aspirin) .Marland Kitchen.. 1 By Mouth Qweek 2)  Warfarin Sodium 2.5 Mg Tabs (Warfarin Sodium) .... Use As Directed By Anticoagualtion Clinic 3)  Metformin Hcl 500 Mg  Tabs (Metformin Hcl) .Marland Kitchen.. 1qd Wih Largest Meal 4)  Klor-Con M10 10 Meq Cr-Tabs (Potassium Chloride Crys Cr) .... Take 1 Tablet By Mouth Two Times A Day 5)  Furosemide 40 Mg  Tabs (Furosemide) .... Take 1 Tablet By [facility] Times A Day 6)  Benazepril Hcl 10 Mg Tabs (Benazepril Hcl) .... Take 1/2  Tablet By Mouth Once A Day 7)  Accu-Chek Comfort Curve   Strp (Glucose Blood) .... Use As Directed 8)  Accu-Chek Soft Touch Lancets   Misc (Lancets) .... Use As Directed 9)  Simvastatin 40 Mg Tabs (Simvastatin) .Marland Kitchen.. 1 At Bedtime 10)  Glimepiride 2 Mg Tabs (Glimepiride) .... 1/2 Once Daily 11)  Carvedilol 25 Mg Tabs (Carvedilol) .... Take One Tablet By Mouth Every Am and 1 & 1/2 Every Pm 12)  Nitrostat 0.4 Mg Subl (Nitroglycerin) .Marland Kitchen.. 1 Tablet Under Tongue At Onset of Chest Pain; You May Repeat Every 5 Minutes For Up To 3 Doses.  Allergies (verified): 1)  ! Pcn  Past History:  Past Medical History: Last updated: 03/11/2009 Diabetes mellitus, type II Hyperlipidemia Hypertension penicillin allergy  pilonidal cyst Atrial fibrillation.... long-term high-dose amiodarone therapy multiple cardioversions.Marland Kitchen amiodarone stopped September  2009 Atrial flutter present... February 02, 2009 Left atrial clot but cardioversions done since that time. Claudication significant peripheral disease in the legs and question mesenteric disease Low back pain from spinal stenosis, DDD....surgery... Dr. Danielle Dess CAD  .Marland KitchenMarland KitchenCatheterization July 2008 vein grafts and LIMA patent..... 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 Georganna Skeans infected leg after bypass treated Nasal drainage  ...chronic Skin discoloration from amiodarone and easy bruisability from Coumadin and aspirin TSH.... borderline low Excess beer consumption.... but not a true alcohol problem Renal insufficiency creatinine 1.2-1.7 range ICD placement 2009 by Dr. Ladona Ridgel also biventricular pacing.... flutter could not be ablated as it is left sided. Chronotropic incompetence pacer rate adjusted June 09 with improvement. 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           Review of Systems       Patient denies fever, chills, headache, sweats, rash, change in vision, change in hearing, chest pain, cough, nausea vomiting, urinary symptoms.  All other systems are reviewed and are negative.  Vital Signs:  Patient profile:   70 year old  male Height:      72.5 inches Weight:      233 pounds BMI:     31.28 Pulse rate:   85 / minute BP sitting:   138 / 84  (left arm) Cuff size:   regular  Vitals Entered By: Hardin Negus, RMA (April 27, 2009 2:27  PM)  Physical Exam  General:  patient is stable today. Eyes:  no xanthelasma. Neck:  no jugular venous distention. Lungs:  lungs are clear.  Respiratory effort is nonlabored. Heart:  cardiac exam reveals S1-S2.  No clicks or significant murmurs. Abdomen:  abdomen is protuberant but soft. Extremities:  no peripheral edema. Psych:  patient is oriented to person time and place.  Affect is normal.  He is here with his wife today.    ICD Specifications Following MD:  [name], MD     Referring MD:  Hanaa Payes ICD Vendor:  [employer]     ICD Model Number:  [phone]     ICD Serial Number:  [id number] ICD DOI:  04/11/2007     ICD Implanting MD:  [name], MD  Lead 1:    Location: RA     DOI: 04/11/2007     Model #: 1478     Serial #: [id number]     Status: active Lead 2:    Location: RV     DOI: 04/11/2007     Model #: 5784     Serial #: [id number]     Status: active Lead 3:    Location: LV     DOI: 04/11/2007     Model #: 1324     Serial #: [id number]     Status: active  Indications::  ICM, CHF   ICD Follow Up ICD Dependent:  No       ICD Device Measurements Configuration: LV TIP TO RV COIL  Episodes Coumadin:  Yes  Brady Parameters Mode DDDR     Lower Rate Limit:  60     Upper Rate Limit 130 PAV 200     Sensed AV Delay:  180  Tachy Zones VF:  200     VT:  250 FVT VIA VF     VT1:  176     Impression & Recommendations:  Problem # 1:  FLUID OVERLOAD (ICD-276.6) Fluid status is stable.  No change in therapy.  Problem # 2:  CORONARY ARTERY DISEASE (ICD-414.00)  His updated medication list for this problem includes:    Adult Aspirin Ec Low Strength 81 Mg Tbec (Aspirin) .Marland Kitchen... 1 by mouth qweek    Warfarin Sodium 2.5 Mg Tabs (Warfarin sodium) ..... Use as directed by anticoagualtion clinic    Benazepril Hcl 10 Mg Tabs (Benazepril hcl) .Marland Kitchen... Take 1/2  tablet by mouth once a day    Carvedilol 25 Mg Tabs (Carvedilol) .Marland Kitchen... Take 1 tab by mouth every am and  2 tabs every pm     Nitrostat 0.4 Mg Subl (Nitroglycerin) .Marland Kitchen... 1 tablet under tongue at onset of chest pain; you may repeat every 5 minutes for up to 3 doses. Coronary disease is stable.  No change in therapy.  Problem # 3:  HYPERTENSION, ESSENTIAL NOS (ICD-401.9)  His updated medication list for this problem includes:    Adult Aspirin Ec Low Strength 81 Mg Tbec (Aspirin) .Marland Kitchen... 1 by mouth qweek    Furosemide 40 Mg Tabs (Furosemide) .Marland Kitchen... Take 1 tablet by mouthtwo-three times a day    Benazepril Hcl 10 Mg Tabs (Benazepril hcl) .Marland KitchenMarland KitchenMarland KitchenMarland Kitchen  Take 1/2  tablet by mouth once a day    Carvedilol 25 Mg Tabs (Carvedilol) .Marland Kitchen... Take 1 tab by mouth every am and  2 tabs every pm I continue to try to adjust the medicines for his blood pressure.  Patient has high blood pressure in the morning.  After taking his morning medicines he has much lower pressure.  Also his heart rate is a little bit higher than I would like to see it.  Therefore we will make in usual change of increasing his carvedilol to 50 mg at night leaving his morning dose at 25 mg.  Of see him back for followup.  Patient Instructions: 1)  Increase Carvedilol to 25mg  1 tab in AM and 2 tabs in PM 2)  Follow up in 4 weeks Prescriptions: CARVEDILOL 25 MG TABS (CARVEDILOL) Take 1 tab by mouth every AM and  2 tabs every PM  #270 x 3   Entered by:   Meredith Staggers, RN   Authorized by:   Talitha Givens, MD, [facility]   Signed by:   Meredith Staggers, RN on 04/27/2009   Method used:   Print then Give to Patient   RxID:   [id number]

## 2010-04-20 NOTE — Letter (Signed)
Summary: Diabetic Instructions  [facility]  [address] [place], [place] [address]   Phone: [phone]  Fax: [phone]    [name] 01/03/1941 MRN: [id number]   _x  _   ORAL DIABETIC MEDICATION INSTRUCTIONS  The day before your procedure:   Take your diabetic pill as you do normally  The day of your procedure:   Do not take your diabetic pill    We will check your blood sugar levels during the admission process and again in Recovery before discharging you home  ________________________________________________________________________

## 2010-04-20 NOTE — Assessment & Plan Note (Signed)
Summary: per check out/sf    Visit Type:  Follow-up Referring Provider:  n/a Primary Provider:  [name] MD  CC:  hypertension.  History of Present Illness: The patient is seen for followup.  Most recently we have been adjusting his meds for his unusual blood pressure measurements.  He has a high blood pressure each morning.  He then takes his medicines and has a period where his pressure drops.  We've been very intent of this being careful not to make him too hypotensive.  Today his blood pressure systolic is 97 and he is asymptomatic.  I have reviewed his list of blood pressures.  They range in the morning with diastolics as high as 100.  At this point I believe he is on the best regimen that we can find.  He knows if he has any symptoms he can back off on his medicines.  He started having chest pain or shortness of breath.  He continues slow improvement from his back surgery.  Current Medications (verified): 1)  Adult Aspirin Ec Low Strength 81 Mg  Tbec (Aspirin) .Marland Kitchen.. 1 By Mouth Q Week 2)  Warfarin Sodium 2.5 Mg Tabs (Warfarin Sodium) .... Use As Directed By Anticoagualtion Clinic 3)  Metformin Hcl 500 Mg  Tabs (Metformin Hcl) .Marland Kitchen.. 1qd Wih Largest Meal 4)  Klor-Con M20 20 Meq Cr-Tabs (Potassium Chloride Crys Cr) .... Take One Tablet Daily 5)  Furosemide 40 Mg  Tabs (Furosemide) .... Take 1 Tablet By [facility] Times A Day 6)  Benazepril Hcl 10 Mg Tabs (Benazepril Hcl) .... Take 1/2  Tablet By Mouth Once A Day 7)  Accu-Chek Comfort Curve   Strp (Glucose Blood) .... Test M,w,f,sun and Check Blood Sugars 2 Hours After Meal 8)  Accu-Chek Soft Touch Lancets   Misc (Lancets) .... Use As Directed 9)  Simvastatin 40 Mg Tabs (Simvastatin) .Marland Kitchen.. 1 At Bedtime, Appointment Due 10)  Glimepiride 2 Mg Tabs (Glimepiride) .... 1/2 Once Daily 11)  Carvedilol 25 Mg Tabs (Carvedilol) .... Take 1 Tab By Mouth Every Am and  2 Tabs Every Pm 12)  Nitrostat 0.4 Mg Subl (Nitroglycerin) .Marland Kitchen.. 1 Tablet Under  Tongue At Onset of Chest Pain; You May Repeat Every 5 Minutes For Up To 3 Doses. 13)  Amlodipine Besylate 5 Mg Tabs (Amlodipine Besylate) .... Take 1 Tab At Supper  Allergies: 1)  ! Pcn  Past History:  Past Medical History: Last updated: 08/03/2009 Current Problems:  ATRIAL FIBRILLATION (ICD-427.31) AUTOMATIC IMPLANTABLE CARDIAC DEFIBRILLATOR SITU (ICD-V45.02) ACUTE ON CHRONIC SYSTOLIC HEART FAILURE (ICD-428.23) CORONARY ARTERY DISEASE (ICD-414.00) CORONARY ARTERY BYPASS GRAFT, HX OF (ICD-V45.81) HYPERTENSION, ESSENTIAL NOS (ICD-401.9) HYPERLIPIDEMIA NEC/NOS (ICD-272.4) ABDOMINAL AORTIC ANEURYSM REPAIR, HX OF (ICD-V15.1) FLUID OVERLOAD (ICD-276.6) * STATUS POST LONG-TERM AMIODARONE THERAPY EVENTUALLY STOPPED HYPOTENSION (ICD-458.9) CLAUDICATION (ICD-443.9) DIABETES MELLITUS, CONTROLLED (ICD-250.00) 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  All of the systems are reviewed and are negative.  Vital Signs:  Patient profile:   70 year old male Height:      72 inches Weight:      239 pounds Pulse rate:  76 / minute Pulse rhythm:   regular BP sitting:   98 / 60  (left arm)  Vitals Entered By: Jacquelin Hawking, CMA (Aug 13, 2009 10:34 AM)  Physical Exam  General:  he looks quite good. Eyes:  no xanthelasma. Neck:  no jugular venous distention. Lungs:  lungs are clear.  Respiratory effort is nonlabored. Heart:  cardiac exam reveals S1 and S2.  There no clicks or significant murmurs. Abdomen:   abdomen is mildly protuberant.  There is no change. Extremities:  there is no peripheral edema. Psych:  patient is oriented time and place.  Affect is normal.    ICD Specifications Following MD:  [name], MD     Referring MD:  Tyshell Ramberg ICD Vendor:  [employer]     ICD Model Number:  [phone]     ICD Serial Number:  [id number] ICD DOI:  04/11/2007     ICD Implanting MD:  [name], MD  Lead 1:    Location: RA     DOI: 04/11/2007     Model #: 2841     Serial #: [id number]     Status: active Lead 2:    Location: RV     DOI: 04/11/2007     Model #: [id number]     Serial #: [id number]     Status: active Lead 3:    Location: LV     DOI: 04/11/2007     Model #: 9563     Serial #: [id number]     Status: active  Indications::  ICM, CHF   ICD Follow Up ICD Dependent:  No       ICD Device Measurements Configuration: LV TIP TO RV COIL  Episodes Coumadin:  Yes  Brady Parameters Mode DDDR     Lower Rate Limit:  60     Upper Rate Limit 130 PAV 200     Sensed AV Delay:  180  Tachy Zones VF:  200     VT:  250 FVT VIA VF     VT1:  176     Impression & Recommendations:  Problem # 1:  ACUTE ON CHRONIC SYSTOLIC HEART FAILURE (ICD-428.23) The patient is stable in this regard.  No change in meds.  Problem # 2:  CORONARY ARTERY DISEASE (ICD-414.00) Coronary disease is very no further workup at this time.  Problem # 3:  HYPERTENSION, ESSENTIAL NOS (ICD-401.9) The patient's blood pressure is now as stable as we will get it.  His pressure tends to be elevated earlier in the morning.  It decreases after his medications.  Will not push his evening meds in order.  I'll see him for followup.  Problem # 4:  BACK PAIN, LUMBAR (ICD-724.2) The patient continues to recover slowly from his back surgery.  He is continuing with therapy.  Problem # 5:  FLUID OVERLOAD (ICD-276.6) His volume status is stable.  No change in therapy.  Patient Instructions: 1)  Follow up in 3 months--August 16th at  1:45 Prescriptions: WARFARIN SODIUM 2.5 MG TABS (WARFARIN SODIUM) Use as directed by Anticoagualtion Clinic  #130 x 3   Entered by:   Meredith Staggers, RN   Authorized by:   Talitha Givens, MD, [facility]   Signed by:   Meredith Staggers, RN on 08/13/2009   Method used:   Electronically to        [facility] (retail)       [address]       [place]  [place], [place]  [address]       Ph: [phone]       Fax: [phone]   RxID:   [id number] KLOR-CON M20 20 MEQ CR-TABS (POTASSIUM CHLORIDE CRYS CR) take one tablet daily  #90 x 3   Entered by:   Meredith Staggers, RN   Authorized by:   Talitha Givens, MD, [facility]   Signed by:   Meredith Staggers, RN on 08/13/2009   Method used:   Electronically to        [facility] (retail)       [address]       [place], [place]  [address]       Ph: [phone]       Fax: [phone]   RxID:   [id number]

## 2010-04-20 NOTE — Progress Notes (Signed)
Summary: pt request call   Phone Note Call from Patient Call back at Work Phone [phone]   Caller: Spouse/Jagjit Summary of Call: PT request call Initial call taken by: Judie Grieve,  February 08, 2010 1:55 PM  Follow-up for Phone Call        Left message to call back Meredith Staggers, RN  February 08, 2010 2:47 PM   spoke w/wife she states they are on the way home from beach and pt hasn't been feeling well, increased fatigue and SOB she is concerned would like him to see Dr Myrtis Ser, appt sch for tom at [facility], RN  February 08, 2010 2:53 PM

## 2010-04-20 NOTE — Letter (Signed)
Summary: [facility] Office Note  [facility] Office Note   Imported By: Roderic Ovens 07/16/2009 15:14:27  _____________________________________________________________________  External Attachment:    Type:   Image     Comment:   External Document

## 2010-04-20 NOTE — Medication Information (Signed)
Summary: rov/tm  Anticoagulant Therapy  Managed by: Shelby Dubin, PharmD, BCPS, CPP Referring MD: [name] MD PCP: [name] MD Supervising MD: Myrtis Ser MD, Tinnie Gens Indication 1: Atrial Fibrillation (ICD-427.31) Lab Used: [facility] Site: [facility] INR POC 1.8 INR RANGE 2 - 3  Dietary changes: no    Health status changes: no    Bleeding/hemorrhagic complications: no    Recent/future hospitalizations: no    Any changes in medication regimen? no    Recent/future dental: no  Any missed doses?: no       Is patient compliant with meds? yes       Allergies (verified): 1)  ! Pcn  Anticoagulation Management History:      The patient is taking warfarin and comes in today for a routine follow up visit.  Positive risk factors for bleeding include an age of 20 years or older and presence of serious comorbidities.  Negative risk factors for bleeding include no history of CVA/TIA.  The bleeding index is 'intermediate risk'.  Positive CHADS2 values include History of CHF, History of HTN, and History of Diabetes.  Negative CHADS2 values include Age > 42 years old and Prior Stroke/CVA/TIA.  The start date was 02/15/2002.  His last INR was 1.6 RATIO.  Anticoagulation responsible provider: Myrtis Ser MD, Tinnie Gens.  INR POC: 1.8.  Cuvette Lot#: [id number].  Exp: 07/2010.    Anticoagulation Management Assessment/Plan:      The patient's current anticoagulation dose is Warfarin sodium 2.5 mg tabs: Use as directed by Anticoagualtion Clinic.  The target INR is 2 - 3.  The next INR is due 06/23/2009.  Anticoagulation instructions were given to patient.  Results were reviewed/authorized by Shelby Dubin, PharmD, BCPS, CPP.  He was notified by Shelby Dubin PharmD, BCPS, CPP.         Prior Anticoagulation Instructions: INR 2.1 Continue 2.5mg s daily except 5mg s on Wednesdays. Recheck in 4 weeks.   Current Anticoagulation Instructions: INR 1.8  Take 2 tabs today, then take 2 tabs each Wednesday and  Saturday and 1 tab on all other days.   Recheck in 4 weeks.

## 2010-04-20 NOTE — Medication Information (Signed)
Summary: rov/tm  Anticoagulant Therapy  Managed by: Cloyde Reams, RN, BSN Referring MD: [name] MD PCP: [name] MD Supervising MD: Gala Romney MD, Reuel Boom Indication 1: Atrial Fibrillation (ICD-427.31) Lab Used: [facility] Site: [facility] INR POC 2.4 INR RANGE 2 - 3  Dietary changes: no    Health status changes: no    Bleeding/hemorrhagic complications: no    Recent/future hospitalizations: no    Any changes in medication regimen? no    Recent/future dental: no  Any missed doses?: no       Is patient compliant with meds? yes       Allergies: 1)  ! Pcn  Anticoagulation Management History:      The patient is taking warfarin and comes in today for a routine follow up visit.  Positive risk factors for bleeding include an age of 19 years or older and presence of serious comorbidities.  Negative risk factors for bleeding include no history of CVA/TIA.  The bleeding index is 'intermediate risk'.  Positive CHADS2 values include History of CHF, History of HTN, and History of Diabetes.  Negative CHADS2 values include Age > 62 years old and Prior Stroke/CVA/TIA.  The start date was 02/15/2002.  His last INR was 1.6 RATIO.  Anticoagulation responsible provider: Bensimhon MD, Reuel Boom.  INR POC: 2.4.  Cuvette Lot#: [id number].  Exp: 08/2010.    Anticoagulation Management Assessment/Plan:      The patient's current anticoagulation dose is Warfarin sodium 2.5 mg tabs: Use as directed by Anticoagualtion Clinic.  The target INR is 2 - 3.  The next INR is due 08/13/2009.  Anticoagulation instructions were given to patient.  Results were reviewed/authorized by Cloyde Reams, RN, BSN.  He was notified by Cloyde Reams RN.         Prior Anticoagulation Instructions: INR 2.5 Continue 2.5mg s daily except 5mg s on Wednesdays and Saturdays. Recheck in 4 weeks.   Current Anticoagulation Instructions: INR 2.4  Continue on same dosage 2.5mg  daily except 5mg  on Wednesdays and Saturdays.   Recheck in 4 weeks.

## 2010-04-20 NOTE — Letter (Signed)
Summary: [facility] Office Note   [facility] Office Note   Imported By: Roderic Ovens 10/07/2009 14:29:21  _____________________________________________________________________  External Attachment:    Type:   Image     Comment:   External Document

## 2010-04-20 NOTE — Progress Notes (Signed)
Summary: LAB RESULTS  Phone Note Call from Patient Call back at Home Phone [phone]   Caller: Patient Call For: [name] MD Reason for Call: Lab or Test Results Summary of Call: PATIENT CALLING, REQUESTING RESULTS OF LABS DONE ON 05-12-2009.  PATIENT IS LEAVING HOME NOW, PLEASE CALL THEM TOMORROW, 05-26-09. Initial call taken by: Magdalen Spatz [facility],  May 25, 2009 4:38 PM  Follow-up for Phone Call        Spoke with patient's wife and informed her of lab results. Hemoglobin A1c was 5.4 (12/30/2008) now 6.1, other labs with in range-some were low normal. Copy to be mailed when addressed by the Dr.  Dr.Shian Goodnow please advise on labs  Follow-up by: Shonna Chock,  May 25, 2009 4:48 PM  Additional Follow-up for Phone Call Additional follow up Details #1::        thank you for notification you had not received lab results; they are excellent. Recheck A1c in 6 months. Report any hypoglycemic episodes Additional Follow-up by: [name] MD,  May 25, 2009 5:03 PM

## 2010-04-20 NOTE — Medication Information (Signed)
Summary: rov/mw  Anticoagulant Therapy  Managed by: Bethena Midget, RN, BSN Referring MD: [name] MD PCP: [name] MD Supervising MD: Daleen Squibb MD, Maisie Fus Indication 1: Atrial Fibrillation (ICD-427.31) Lab Used: [facility] Site: [facility] INR POC 2.5 INR RANGE 2 - 3  Dietary changes: no    Health status changes: no    Bleeding/hemorrhagic complications: no    Recent/future hospitalizations: no    Any changes in medication regimen? no    Recent/future dental: no  Any missed doses?: no       Is patient compliant with meds? yes       Allergies: 1)  ! Pcn  Anticoagulation Management History:      The patient is taking warfarin and comes in today for a routine follow up visit.  Positive risk factors for bleeding include an age of 39 years or older and presence of serious comorbidities.  Negative risk factors for bleeding include no history of CVA/TIA.  The bleeding index is 'intermediate risk'.  Positive CHADS2 values include History of CHF, History of HTN, and History of Diabetes.  Negative CHADS2 values include Age > 63 years old and Prior Stroke/CVA/TIA.  The start date was 02/15/2002.  His last INR was 1.6 RATIO.  Anticoagulation responsible provider: Daleen Squibb MD, Maisie Fus.  INR POC: 2.5.  Cuvette Lot#: [id number].  Exp: 01/2011.    Anticoagulation Management Assessment/Plan:      The patient's current anticoagulation dose is Warfarin sodium 2.5 mg tabs: Use as directed by Anticoagualtion Clinic.  The target INR is 2 - 3.  The next INR is due 01/27/2010.  Anticoagulation instructions were given to patient.  Results were reviewed/authorized by Bethena Midget, RN, BSN.  He was notified by Bethena Midget, RN, BSN.         Prior Anticoagulation Instructions: INR 3.1 Continue with same dosing schedule of 1 tablet on everyday except 2 tablets on wednesday and saturday. We'll see you in 4 weeks.  Current Anticoagulation Instructions: INR 2.5 Continue 2.5mg s daily except 5mg s on  Wednesdays and Saturdays. Recheck in 4 weeks.

## 2010-04-20 NOTE — Letter (Signed)
Summary: [facility]  [facility]   Imported By: Lanelle Bal 12/01/2009 08:31:11  _____________________________________________________________________  External Attachment:    Type:   Image     Comment:   External Document

## 2010-04-20 NOTE — Assessment & Plan Note (Signed)
Summary: pneumonia vac/cbs   Nurse Visit   Allergies: 1)  ! Pcn  Immunizations Administered:  Pneumonia Vaccine:    Vaccine Type: Pneumovax (Medicare)    Site: left deltoid    Mfr: Merck    Dose: 0.5 ml    Route: IM    Given by: Shonna Chock CMA    Exp. Date: 06/07/2011    Lot #: [id number]  Orders Added: 1)  Pneumococcal Vaccine [90732] 2)  Admin 1st Vaccine [09811]

## 2010-04-20 NOTE — Medication Information (Signed)
Summary: [name]  Anticoagulant Therapy  Managed by: Bethena Midget, RN, BSN Referring MD: [name] MD PCP: [name] MD Supervising MD: Excell Seltzer MD, Casimiro Needle Indication 1: Atrial Fibrillation (ICD-427.31) Lab Used: [facility] Site: [facility] INR POC 2.1 INR RANGE 2 - 3  Dietary changes: no    Health status changes: no    Bleeding/hemorrhagic complications: no    Recent/future hospitalizations: no    Any changes in medication regimen? yes       Details: Coreg increased in PM dose  Recent/future dental: no  Any missed doses?: no       Is patient compliant with meds? yes      Comments: Saw Dr. Myrtis Ser  Allergies: 1)  ! Pcn  Anticoagulation Management History:      The patient is taking warfarin and comes in today for a routine follow up visit.  Positive risk factors for bleeding include an age of 69 years or older and presence of serious comorbidities.  Negative risk factors for bleeding include no history of CVA/TIA.  The bleeding index is 'intermediate risk'.  Positive CHADS2 values include History of CHF, History of HTN, and History of Diabetes.  Negative CHADS2 values include Age > 69 years old and Prior Stroke/CVA/TIA.  The start date was 02/15/2002.  His last INR was 1.6 RATIO.  Anticoagulation responsible provider: Excell Seltzer MD, Casimiro Needle.  INR POC: 2.1.  Cuvette Lot#: [id number].  Exp: 06/2010.    Anticoagulation Management Assessment/Plan:      The patient's current anticoagulation dose is Warfarin sodium 2.5 mg tabs: Use as directed by Anticoagualtion Clinic.  The target INR is 2 - 3.  The next INR is due 05/26/2009.  Anticoagulation instructions were given to patient.  Results were reviewed/authorized by Bethena Midget, RN, BSN.  He was notified by Bethena Midget, RN, BSN.         Prior Anticoagulation Instructions: INR 2.0  Continue on same dosage 1 tablet daily except 2 tablets on Wednesdays.   Recheck in 4 weeks.    Current Anticoagulation Instructions: INR  2.1 Continue 2.5mg s daily except 5mg s on Wednesdays. Recheck in 4 weeks.

## 2010-04-20 NOTE — Medication Information (Signed)
Summary: [facility]v coumadin - [facility]  Anticoagulant Therapy  Managed by: Bethena Midget, RN, BSN Referring MD: [name] MD PCP: [name] MD Supervising MD: Tenny Craw MD, Gunnar Fusi Indication 1: Atrial Fibrillation (ICD-427.31) Lab Used: [facility] Site: [facility] INR POC 2.0 INR RANGE 2 - 3  Dietary changes: no    Health status changes: no    Bleeding/hemorrhagic complications: no    Recent/future hospitalizations: yes       Details: 11/25-12/1 was in [facility]  for pleural edema and hypotension  Any changes in medication regimen? no    Recent/future dental: no  Any missed doses?: no       Is patient compliant with meds? yes      Comments: Discharged home on same coumadin dose and lovenox BID  Current Medications (verified): 1)  Adult Aspirin Ec Low Strength 81 Mg  Tbec (Aspirin) .Marland Kitchen.. 1 By Mouth Q Week 2)  Warfarin Sodium 2.5 Mg Tabs (Warfarin Sodium) .... Use As Directed By Anticoagualtion Clinic 3)  Metformin Hcl 500 Mg  Tabs (Metformin Hcl) .Marland Kitchen.. 1qd Wih Largest Meal 4)  Klor-Con M20 20 Meq Cr-Tabs (Potassium Chloride Crys Cr) .... Take 1/2 Tablet Daily 5)  Furosemide 40 Mg  Tabs (Furosemide) .... Take 1 Tab Daily 6)  Accu-Chek Comfort Curve   Strp (Glucose Blood) .... Test M,w,f,sun and Check Blood Sugars 2 Hours After Meal 7)  Accu-Chek Soft Touch Lancets   Misc (Lancets) .... Use As Directed 8)  Simvastatin 40 Mg Tabs (Simvastatin) .Marland Kitchen.. 1 At Bedtime 9)  Glimepiride 2 Mg Tabs (Glimepiride) .... 1/2 Once Daily 10)  Carvedilol 25 Mg Tabs (Carvedilol) .... Take 1/2 Tab Two Times A Day 11)  Nitrostat 0.4 Mg Subl (Nitroglycerin) .Marland Kitchen.. 1 Tablet Under Tongue At Onset of Chest Pain; You May Repeat Every 5 Minutes For Up To 3 Doses. 12)  Indomethacin 25 Mg Caps (Indomethacin) .... Take 1 Capsule By Mouth Three Times A Day As Needed 13)  Methocarbamol 500 Mg Tabs (Methocarbamol) .... Take 1 Three Times A Day 14)  Gabapentin 100 Mg Caps (Gabapentin) .Marland Kitchen.. 1 Every 8 Hrs As Needed For Leg  Pain  Allergies (verified): 1)  ! Pcn  Anticoagulation Management History:      The patient is taking warfarin and comes in today for a routine follow up visit.  Positive risk factors for bleeding include an age of 70 years or older and presence of serious comorbidities.  Negative risk factors for bleeding include no history of CVA/TIA.  The bleeding index is 'intermediate risk'.  Positive CHADS2 values include History of CHF, History of HTN, and History of Diabetes.  Negative CHADS2 values include Age > 25 years old and Prior Stroke/CVA/TIA.  The start date was 02/15/2002.  His last INR was 1.6 RATIO.  Anticoagulation responsible provider: Tenny Craw MD, Gunnar Fusi.  INR POC: 2.0.  Cuvette Lot#: [id number].  Exp: 12/2010.    Anticoagulation Management Assessment/Plan:      The patient's current anticoagulation dose is Warfarin sodium 2.5 mg tabs: Use as directed by Anticoagualtion Clinic.  The target INR is 2 - 3.  The next INR is due 03/04/2010.  Anticoagulation instructions were given to patient.  Results were reviewed/authorized by Bethena Midget, RN, BSN.  He was notified by Bethena Midget, RN, BSN.         Prior Anticoagulation Instructions: INR 3.1  Coumadin 2.5mg  tabs, take only 1 tab today 11/9 then 1 tab each day except 2 tabs on Wed and Sat  Current Anticoagulation  Instructions: INR 2.0 Continue 2.5mg s everyday except 5mg s on Wednesdays and Saturdays. Recheck in 10 days. Discontinue Lovenox injections.   Appended Document: pt gaining weight Please be sure you talk to them today and let me know.

## 2010-04-20 NOTE — Assessment & Plan Note (Signed)
Summary: f48m  Medications Added METHOCARBAMOL 500 MG TABS (METHOCARBAMOL) 1-3 times daily      Allergies Added:   Visit Type:  Follow-up Referring Provider:  Barnett Abu, MD  Malon Kindle, MD  Gerlene Burdock Ramos,MD Primary Provider:  [name] MD  CC:  shortness of breath.  History of Present Illness: The patient is seen for cardiology followup.  His medical problems are extensive as outlined.  He's had back surgery a year ago.  Ear is nicely healed and functional but unfortunately he still has problems in his back.  Etiology is not clear.  He is going to see Dr.Norris of orthopedics or other assessment.  It is clear that the patient's surgical repair his excellent.  To continue to look for other problems nerve conduction studies are being arranged.  Also, there is question as to whether or not a facet injection might be helpful.  The patient is having some shortness of breath.  He has is primarily in the early part of the day and then it improves.  He is gaining weight but it is not fluid weight.  There is no PND or orthopnea.  Current Medications (verified): 1)  Adult Aspirin Ec Low Strength 81 Mg  Tbec (Aspirin) .Marland Kitchen.. 1 By Mouth Q Week 2)  Warfarin Sodium 2.5 Mg Tabs (Warfarin Sodium) .... Use As Directed By Anticoagualtion Clinic 3)  Metformin Hcl 500 Mg  Tabs (Metformin Hcl) .Marland Kitchen.. 1qd Wih Largest Meal 4)  Klor-Con M20 20 Meq Cr-Tabs (Potassium Chloride Crys Cr) .... Take One Tablet Daily 5)  Furosemide 40 Mg  Tabs (Furosemide) .... Take 1 Tablet By [facility] Times A Day 6)  Benazepril Hcl 10 Mg Tabs (Benazepril Hcl) .... Take 1/2  Tablet By Mouth Once A Day 7)  Accu-Chek Comfort Curve   Strp (Glucose Blood) .... Test M,w,f,sun and Check Blood Sugars 2 Hours After Meal 8)  Accu-Chek Soft Touch Lancets   Misc (Lancets) .... Use As Directed 9)  Simvastatin 40 Mg Tabs (Simvastatin) .Marland Kitchen.. 1 At Bedtime, Appointment Due 10)  Glimepiride 2 Mg Tabs (Glimepiride) .... 1/2 Once Daily 11)   Carvedilol 25 Mg Tabs (Carvedilol) .... Take 1 Tab By Mouth Every Am and  2 Tabs Every Pm 12)  Nitrostat 0.4 Mg Subl (Nitroglycerin) .Marland Kitchen.. 1 Tablet Under Tongue At Onset of Chest Pain; You May Repeat Every 5 Minutes For Up To 3 Doses. 13)  Amlodipine Besylate 5 Mg Tabs (Amlodipine Besylate) .... Take 1 Tab At Supper 14)  Indomethacin 25 Mg Caps (Indomethacin) .... Take 1 Capsule By Mouth Three Times A Day As Needed 15)  Methocarbamol 500 Mg Tabs (Methocarbamol) .Marland Kitchen.. 1-3 Times Daily  Allergies (verified): 1)  ! 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  All other systems are reviewed and are negative.  Vital Signs:  Patient profile:   70 year old male Height:      72 inches Weight:      248 pounds BMI:     33.76 Pulse rate:   70 / minute BP sitting:   128 / 76  (right arm) Cuff size:   regular  Vitals Entered By: Hardin Negus,  RMA (November 03, 2009 1:46 PM)  Physical Exam  General:  patient is stable.  He has gained some weight. Head:  head is atraumatic. Eyes:  no xanthelasma. Ears:  patient has decreased hearing. Neck:  no jugular venous distention. Chest Wall:  no chest wall tenderness. Lungs:  lungs are clear.  Respiratory effort is nonlabored. Heart:  cardiac exam reveals S1-S2.  No clicks.  There is a soft systolic murmur. Abdomen:  abdomen is protuberant but soft. Msk:  patient has the chronic changes in his right leg. Extremities:  no peripheral edema. Skin:  no skin rashes. Psych:  patient is oriented to person time and place.  Affect is normal.    ICD Specifications Following MD:  [name], MD     Referring MD:  KATZ ICD Vendor:  [employer]     ICD Model Number:  [phone]     ICD Serial Number:  [id number] ICD DOI:  04/11/2007     ICD Implanting MD:  [name], MD  Lead 1:    Location: RA     DOI: 04/11/2007     Model #: [id number]     Serial #: [id number]     Status: active Lead 2:    Location: RV     DOI: 04/11/2007     Model #: [id number]     Serial #: [id number]     Status: active Lead 3:    Location: LV     DOI: 04/11/2007     Model #: [id number]     Serial #: [id number]     Status: active  Indications::  ICM, CHF   ICD Follow Up ICD Dependent:  No       ICD Device Measurements Configuration: LV TIP TO RV COIL  Episodes Coumadin:  Yes  Brady Parameters Mode DDDR     Lower Rate Limit:  60     Upper Rate Limit 130 PAV 200     Sensed AV Delay:  180  Tachy Zones VF:  200     VT:  250 FVT VIA VF     VT1:  176     Impression & Recommendations:  Problem # 1:  AUTOMATIC IMPLANTABLE CARDIAC DEFIBRILLATOR SITU (ICD-V45.02) The patient's defibrillator is stable in place.  If he has nerve conduction studies EEG needs to have a magnet in place or have his ICD turned off with the representative present.  We will be in touch with Dr. Ethelene Hal to be sure that this is done.  Problem # 2:  ATRIAL  FIBRILLATION (ICD-427.31)  His updated medication list for this problem includes:    Adult Aspirin Ec Low Strength 81 Mg Tbec (Aspirin) .Marland Kitchen... 1 by mouth q week    Warfarin Sodium 2.5 Mg Tabs (Warfarin sodium) ..... Use as directed by anticoagualtion clinic    Carvedilol 25 Mg Tabs (Carvedilol) .Marland Kitchen... Take 1 tab by mouth every am and  2 tabs every pm Atrial fibrillation is chronic.  We discussed today that Pradaxa may be an option in the future.  I will me to recheck his renal function.  Problem # 3:  CORONARY ARTERY DISEASE (ICD-414.00)  His updated medication list for this problem includes:    Adult Aspirin Ec Low Strength 81 Mg Tbec (Aspirin) .Marland Kitchen... 1 by mouth q week    Warfarin Sodium 2.5 Mg Tabs (Warfarin sodium) ..... Use as directed by anticoagualtion clinic    Benazepril Hcl 10 Mg Tabs (Benazepril hcl) .Marland Kitchen... Take 1/2  tablet by mouth once a day    Carvedilol 25 Mg Tabs (Carvedilol) .Marland Kitchen... Take 1 tab by mouth every am and  2 tabs every pm    Nitrostat 0.4 Mg Subl (Nitroglycerin) .Marland Kitchen... 1 tablet under tongue at onset of chest pain; you may repeat every 5 minutes for up to 3 doses.    Amlodipine Besylate 5 Mg Tabs (Amlodipine besylate) .Marland Kitchen... Take 1 tab at supper Coronary disease is stable.  No further workup at this time.  Problem # 4:  HYPERTENSION, ESSENTIAL NOS (ICD-401.9)  His updated medication list for this problem includes:    Adult Aspirin Ec Low Strength 81 Mg Tbec (Aspirin) .Marland Kitchen... 1 by mouth q week    Furosemide 40 Mg Tabs (Furosemide) .Marland Kitchen... Take 1 tablet by mouthtwo-three times a day    Benazepril Hcl 10 Mg Tabs (Benazepril hcl) .Marland Kitchen... Take 1/2  tablet by mouth once a day    Carvedilol 25 Mg Tabs (Carvedilol) .Marland Kitchen... Take 1 tab by mouth every am and  2 tabs every pm    Amlodipine Besylate 5 Mg Tabs (Amlodipine besylate) .Marland Kitchen... Take 1 tab at supper We have spent an extensive amount of time adjusting his medications for his blood pressure.  With the current regimen we have including  the medicines and  the time of day that they were taken, he is getting excellent control.  No further changes.  Problem # 5:  BACK PAIN, LUMBAR (ICD-724.2) The patient has undergone an extensive operation by Dr.Elsner.  He has an excellent surgical result.  Unfortunately he continues to have limiting discomfort.  Etiology is not clear.  He describes a limiting tightening sensation in his right flank after walking a short period.  He also describes some discomfort in his legs.  He will be having nerve conduction studies arranged by Dr. Ranell Patrick and to be done by Dr. Ethelene Hal.  We will be in touch with them to be sure that his ICD is managed appropriately at the time of the study.  Afterward it is my understanding also that consideration will be given to injecting a facet joint.  I'm hopeful that Drs. Norris and Elsner we'll be in touch with each other regarding the ongoing approach to his care.  In the past we have felt that his symptoms are not vascular in origin.  Over time I will review all vascular studies and decide if other studies will be appropriate to help.  At this time his symptoms do not sound like classic claudication.  Patient Instructions: 1)  Follow up in 8 weeks

## 2010-04-20 NOTE — Progress Notes (Signed)
Summary: QUESTION ON DEVICE   Phone Note Call from Patient Call back at Home Phone [phone]   Caller: Spouse/FRANCES Reason for Call: Talk to Doctor Summary of Call: PT HAS QUESTION ON DEVICE.  Initial call taken by: Roe Coombs,  February 02, 2010 10:34 AM  Follow-up for Phone Call        lmom for pt to return call. Vella Kohler  February 02, 2010 4:16 PM  Additional Follow-up for Phone Call Additional follow up Details #1::        spoke w/pt family member---answered questions about carelink transmitter.  pt has been sob last couple of days.  family member will call tomorrow to make sure transmission was received. Vella Kohler  February 03, 2010 10:12 AM
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
## 2010-04-22 NOTE — Assessment & Plan Note (Signed)
Summary: eph/mt  Medications Added FUROSEMIDE 40 MG  TABS (FUROSEMIDE) Take 1 tab two times a day CARVEDILOL 25 MG TABS (CARVEDILOL) Take 1 tab two times a day COZAAR 50 MG TABS (LOSARTAN POTASSIUM) Take one tablet by mouth two times a day COLCRYS 0.6 MG TABS (COLCHICINE) two times a day BENAZEPRIL HCL 10 MG TABS (BENAZEPRIL HCL) 1/2 tab once daily AMLODIPINE BESYLATE 5 MG TABS (AMLODIPINE BESYLATE) 1/2 tab at bedtime      Allergies Added:   Visit Type:  post hospital visit Referring Provider:  Barnett Abu, MD  Malon Kindle, MD  Richard Ramos,MD  Dr. Danie Binder Neurologic Primary Provider:  [name] MD  CC:  shortness of breath.  History of Present Illness: The patient is seen after his hospitalization.  When he was in the office on February 12, 2010 he became hypotensive.  I was concerned that he may have been over diuresed.  He was admitted to the hospital from the office.  He had some renal dysfunction improved with rehydration.  His echo revealed an ejection fraction of 55-60% with a small to moderate pericardial effusion.  He had a followup echo in the hospital that showed only a very small effusion.  He large loculated left pleural effusion.  This was tapped and it was an exudate.  The cytology revealed no cancer cells.  He was seen by Dr. Sherene Sires who felt that he did not need any further aggressive workup.  He felt that there was no proof of mesothelioma.  Ultimately all of his issues were considered.  He developed an episode of gout in the right foot and has now stabilized.  He was discharged home on lower doses of medicines because his blood pressure had been low when he was admitted.  Since being at home he gathered some fluid in his diuretic dose was increased.   He is having continued shortness of breath but he is stable.  He feels worse in the morning and his blood pressure is high at that time.  We had seen the pattern of high blood pressure in the past and has  adjusted his meds for this.  Current Medications (verified): 1)  Adult Aspirin Ec Low Strength 81 Mg  Tbec (Aspirin) .Marland Kitchen.. 1 By Mouth Q Week 2)  Warfarin Sodium 2.5 Mg Tabs (Warfarin Sodium) .... Use As Directed By Anticoagualtion Clinic 3)  Metformin Hcl 500 Mg  Tabs (Metformin Hcl) .Marland Kitchen.. 1qd Wih Largest Meal 4)  Klor-Con M20 20 Meq Cr-Tabs (Potassium Chloride Crys Cr) .... Take 1/2 Tablet Daily 5)  Furosemide 40 Mg  Tabs (Furosemide) .... Take 1 Tab Two Times A Day 6)  Accu-Chek Comfort Curve   Strp (Glucose Blood) .... Test M,w,f,sun and Check Blood Sugars 2 Hours After Meal 7)  Accu-Chek Soft Touch Lancets   Misc (Lancets) .... Use As Directed 8)  Simvastatin 40 Mg Tabs (Simvastatin) .Marland Kitchen.. 1 At Bedtime 9)  Glimepiride 2 Mg Tabs (Glimepiride) .... 1/2 Once Daily 10)  Carvedilol 25 Mg Tabs (Carvedilol) .... Take 1 Tab Two Times A Day 11)  Nitrostat 0.4 Mg Subl (Nitroglycerin) .Marland Kitchen.. 1 Tablet Under Tongue At Onset of Chest Pain; You May Repeat Every 5 Minutes For Up To 3 Doses. 12)  Indomethacin 25 Mg Caps (Indomethacin) .... Take 1 Capsule By Mouth Three Times A Day As Needed 13)  Methocarbamol 500 Mg Tabs (Methocarbamol) .... Take 1 Three Times A Day 14)  Gabapentin 100 Mg Caps (Gabapentin) .Marland Kitchen.. 1 Every 8 Hrs As Needed  For Leg Pain 15)  Cozaar 50 Mg Tabs (Losartan Potassium) .... Take One Tablet By Mouth Two Times A Day 16)  Colcrys 0.6 Mg Tabs (Colchicine) .... Two Times A Day  Allergies (verified): 1)  ! Pcn  Past History:  Past Medical History: DM Hyperlipidemia Hypertension penicillin allergy  pilonidal cyst Atrial fibrillation.... long-term high-dose amiodarone therapy multiple cardioversions.Marland Kitchen amiodarone stopped September 2009 Atrial flutter present... February 02, 2009 Left atrial clot but cardioversions done since that time. Claudication significant peripheral disease in the legs and question mesenteric disease Low back pain from spinal stenosis, DDD....surgery... Dr.  Danielle Dess CAD  .Marland KitchenMarland KitchenCatheterization July 2008 vein grafts and LIMA patent..... 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 Georganna Skeans infected leg after bypass treated Nasal drainage  ...chronic Skin discoloration from amiodarone and easy bruisability from Coumadin and aspirin TSH.... borderline low Excess beer consumption.... but not a true alcohol problem Renal insufficiency creatinine 1.2-1.7 range ICD placement 2009 by Dr. Ladona Ridgel also biventricular pacing.... flutter could not be ablated as it is left sided. Chronotropic incompetence pacer rate adjusted June 09 with improvement. 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 Shortness of breath    worsening recently.... November, 2011...chest x-ray with large left effusion pleural effusion   left.... thoracentesis in hospital... November, 2011.... exudative... cytology negative... seen by Dr.Wert... no proof of mesothelial... plan to follow Pericardial effusion   November, 2011.... decreased during hospitalization   Review of Systems       The patient denies fever, chills, headache, sweats, rash, change in vision, change in hearing,chest pain, cough, nausea or vomiting.  All other systems are reviewed and are.  Negative  Vital Signs:  Patient profile:   70 year old male Height:      72 inches Weight:      243 pounds BMI:     33.08 Pulse rate:   79 / minute BP sitting:   128 / 70  (left arm) Cuff size:   regular  Vitals  Entered By: Hardin Negus, RMA (March 04, 2010 3:45 PM)  Physical Exam  General:  The patient is stable today. Head:  head is atraumatic. Eyes:  no xanthelasma. Neck:  no jugular venous distention. Chest Wall:  no chest wall tenderness. Lungs:  decreased breath sounds in the left lower base posteriorly compatible with his loculated effusion.  No obvious rales are heard. Heart:  cardiac exam reveals S1-S2.  There is a soft systolic murmur. Abdomen:  abdomen is protuberant.  He has some areas of ecchymoses from his Lovenox shots. Msk:  no musculoskeletal deformities. Extremities:  no peripheral edema. Skin:  he still has a grayish tint to his skin from amiodarone in the past. Psych:  patient is oriented to person time and place.  Affect is normal.  He is here with his wife.    ICD Specifications Following MD:  [name], MD     Referring MD:  KATZ ICD Vendor:  [employer]     ICD Model Number:  [phone]     ICD Serial Number:  [id number] ICD DOI:  04/11/2007  ICD Implanting MD:  [name], MD  Lead 1:    Location: RA     DOI: 04/11/2007     Model #: 1610     Serial #: [id number]     Status: active Lead 2:    Location: RV     DOI: 04/11/2007     Model #: 1914     Serial #: [id number]     Status: active Lead 3:    Location: LV     DOI: 04/11/2007     Model #: 0865     Serial #: [id number]     Status: active  Indications::  ICM, CHF   ICD Follow Up ICD Dependent:  No       ICD Device Measurements Configuration: LV TIP TO RV COIL  Episodes Coumadin:  Yes  Brady Parameters Mode DDDR     Lower Rate Limit:  60     Upper Rate Limit 130 PAV 200     Sensed AV Delay:  180  Tachy Zones VF:  200     VT:  250 FVT VIA VF     VT1:  176     Impression & Recommendations:  Problem # 1:  PLEURAL EFFUSION, LEFT (ICD-511.9) The left  pleural effusion will be followed over time. Is loculated.  Problem # 2:  COUMADIN THERAPY (ICD-V58.61) He continues on Coumadin.  Problem # 3:   HYPERTENSION, ESSENTIAL NOS (ICD-401.9)  His updated medication list for this problem includes:    Adult Aspirin Ec Low Strength 81 Mg Tbec (Aspirin) .Marland Kitchen... 1 by mouth q week    Furosemide 40 Mg Tabs (Furosemide) .Marland Kitchen... Take 1 tab two times a day    Carvedilol 25 Mg Tabs (Carvedilol) .Marland Kitchen... Take 1 tab two times a day    Cozaar 50 Mg Tabs (Losartan potassium) .Marland Kitchen... Take one tablet by mouth two times a day    Benazepril Hcl 10 Mg Tabs (Benazepril hcl) .Marland Kitchen... 1/2 tab once daily    Amlodipine Besylate 5 Mg Tabs (Amlodipine besylate) .Marland Kitchen... 1/2 tab at bedtime His morning blood pressures are elevated again.  We will resume his morning but has currently an evening amlodipine.  Problem # 4:  FLUID OVERLOAD (ICD-276.6) Discharge volume status is actually stable.  We will try to keep him at this way if we can.  Problem # 5:  CHRONIC KIDNEY DISEASE STAGE II (MILD) (ICD-585.2) After hospitalization he had followup labs with a creatinine of 1.3.  This is baseline for him.  Problem # 6:  GOUT, HX OF (ICD-V12.2) His gout has stabilized.  He will use colchicine as needed.  Problem # 7:  * PERICARDIAL EFFUSION During the hospitalization his pericardial effusion decreased.  There is no need for follow up echo at this time.  I will see him for followup in 2 weeks to see how his blood pressure and shortness of breath or doing  Problem # 8:  ATRIAL FIBRILLATION (ICD-427.31)  His updated medication list for this problem includes:    Adult Aspirin Ec Low Strength 81 Mg Tbec (Aspirin) .Marland Kitchen... 1 by mouth q week    Warfarin Sodium 2.5 Mg Tabs (Warfarin sodium) ..... Use as directed by anticoagualtion clinic    Carvedilol 25 Mg Tabs (Carvedilol) .Marland Kitchen... Take 1 tab two times a day  Orders: EKG w/ Interpretation (93000)  His updated medication list for this problem includes:    Adult Aspirin Ec Low Strength 81 Mg Tbec (Aspirin) .Marland Kitchen... 1 by mouth q week  Warfarin Sodium 2.5 Mg Tabs (Warfarin sodium) ..... Use as  directed by anticoagualtion clinic    Carvedilol 25 Mg Tabs (Carvedilol) .Marland Kitchen... Take 1 tab two times a day  Patient Instructions: 1)  Resume benazepril and amlodipine 2)  Keep follow up appt. sch. for 12/28 at 8:45am

## 2010-04-22 NOTE — Medication Information (Signed)
Summary: [facility]v coumadin - [facility]  Anticoagulant Therapy  Managed by: Bethena Midget, RN, BSN Referring MD: [name] MD PCP: [name] MD Supervising MD: Jens Som MD, Arlys John Indication 1: Atrial Fibrillation (ICD-427.31) Lab Used: [facility] Site: [facility] INR POC 2.6 INR RANGE 2 - 3  Dietary changes: no    Health status changes: no    Bleeding/hemorrhagic complications: no    Recent/future hospitalizations: no    Any changes in medication regimen? no    Recent/future dental: no  Any missed doses?: no       Is patient compliant with meds? yes      Comments: Pending procedure on 03/30/10 needs to be off for 5 days.  Seeing Dr Myrtis Ser today.   Allergies: 1)  ! Pcn  Anticoagulation Management History:      The patient is taking warfarin and comes in today for a routine follow up visit.  Positive risk factors for bleeding include an age of 69 years or older and presence of serious comorbidities.  Negative risk factors for bleeding include no history of CVA/TIA.  The bleeding index is 'intermediate risk'.  Positive CHADS2 values include History of CHF, History of HTN, and History of Diabetes.  Negative CHADS2 values include Age > 69 years old and Prior Stroke/CVA/TIA.  The start date was 02/15/2002.  His last INR was 1.6 RATIO.  Anticoagulation responsible provider: Jens Som MD, Arlys John.  INR POC: 2.6.  Cuvette Lot#: [id number].  Exp: 03/2011.    Anticoagulation Management Assessment/Plan:      The patient's current anticoagulation dose is Warfarin sodium 2.5 mg tabs: Use as directed by Anticoagualtion Clinic.  The target INR is 2 - 3.  The next INR is due 04/07/2010.  Anticoagulation instructions were given to patient.  Results were reviewed/authorized by Bethena Midget, RN, BSN.  He was notified by Bethena Midget, RN, BSN.         Prior Anticoagulation Instructions: INR 2.5  Continue same dose of 1 tablet every day except 2 tablets on Wednesday and Saturday.  Recheck INR 4 weeks.    Current Anticoagulation Instructions: INR 2.6 Continue 2.5mg s daily except 5mg s on Wednesdays and Saturdays. Take last dose on 03/24/09 then per MD instructions restart but take 5mg s on 1st day.  Recheck INR 1 week after procedure

## 2010-04-22 NOTE — Medication Information (Signed)
Summary: rov  Anticoagulant Therapy  Managed by: Bethena Midget, RN, BSN Referring MD: [name] MD PCP: [name] MD Supervising MD: Graciela Husbands MD, Viviann Spare Indication 1: Atrial Fibrillation (ICD-427.31) Lab Used: [facility] Site: [facility] INR POC 1.8 INR RANGE 2 - 3  Dietary changes: no    Health status changes: no    Bleeding/hemorrhagic complications: no     Any changes in medication regimen? yes       Details: Norvasc 2.5mg s QOD added today  Recent/future dental: no  Any missed doses?: no       Is patient compliant with meds? yes      Comments: Restarted on 03/30/10 with 5mg s for 1 time dose after being off for procedure. Saw Dr Myrtis Ser today.  Spouse states pt has appt on 05/11/10 with Dr Myrtis Ser and wants to combine both appt due to co-pay, discuss with them the need to check sooner but they want to combine.   Allergies: 1)  ! Pcn  Anticoagulation Management History:      The patient is taking warfarin and comes in today for a routine follow up visit.  Positive risk factors for bleeding include an age of 70 years or older and presence of serious comorbidities.  Negative risk factors for bleeding include no history of CVA/TIA.  The bleeding index is 'intermediate risk'.  Positive CHADS2 values include History of CHF, History of HTN, and History of Diabetes.  Negative CHADS2 values include Age > 69 years old and Prior Stroke/CVA/TIA.  The start date was 02/15/2002.  His last INR was 1.6 RATIO.  Anticoagulation responsible provider: Graciela Husbands MD, Viviann Spare.  INR POC: 1.8.  Cuvette Lot#: [id number].  Exp: 03/2011.    Anticoagulation Management Assessment/Plan:      The patient's current anticoagulation dose is Warfarin sodium 2.5 mg tabs: Use as directed by Anticoagualtion Clinic.  The target INR is 2 - 3.  The next INR is due 05/11/2010.  Anticoagulation instructions were given to patient.  Results were reviewed/authorized by Bethena Midget, RN, BSN.  He was notified by Bethena Midget, RN,  BSN.         Prior Anticoagulation Instructions: INR 2.6 Continue 2.5mg s daily except 5mg s on Wednesdays and Saturdays. Take last dose on 03/24/09 then per MD instructions restart but take 5mg s on 1st day.  Recheck INR 1 week after procedure  Current Anticoagulation Instructions: INR 1.8 Tomorrow take 2 pill then resume 1 pill everyday except 2 pills on Wednesdays and Saturdays. Recheck in 4 weeks.

## 2010-04-22 NOTE — Assessment & Plan Note (Signed)
Summary: f65m  Medications Added COLCRYS 0.6 MG TABS (COLCHICINE) two times a day as needed AMLODIPINE BESYLATE 2.5 MG TABS (AMLODIPINE BESYLATE) every other day at bedtime      Allergies Added:   Visit Type:  Follow-up Referring Provider:  Barnett Abu, MD  Malon Kindle, MD  Richard Ramos,MD  Dr. Danie Binder Neurologic Primary Provider:  [name] MD  CC:  hypertension.  History of Present Illness: The patient is seen for cardiology followup for his multiple cardiac problems.  Most recently he had some problems with hypotension.  We have readjusted his medicines and is actually doing well.  He continues to have diastolic pressures at 100 or higher in the morning.  He then takes his medications and he has nicely controlled blood pressure.  He has not had any hypotensive spells but his pressure does get low at times.  Previously we had used amlodipine 5 mg at night.  He has had a back injection.  This has helped significantly.  He still has some discomfort but he is significantly improved.  Current Medications (verified): 1)  Adult Aspirin Ec Low Strength 81 Mg  Tbec (Aspirin) .Marland Kitchen.. 1 By Mouth Q Week 2)  Warfarin Sodium 2.5 Mg Tabs (Warfarin Sodium) .... Use As Directed By Anticoagualtion Clinic 3)  Metformin Hcl 500 Mg  Tabs (Metformin Hcl) .Marland Kitchen.. 1qd Wih Largest Meal 4)  Klor-Con M20 20 Meq Cr-Tabs (Potassium Chloride Crys Cr) .... Take 1/2 Tablet Daily 5)  Furosemide 40 Mg  Tabs (Furosemide) .... Take 1 Tab Two Times A Day, May Take An Extra Tab As Needed 6)  Accu-Chek Comfort Curve   Strp (Glucose Blood) .... Test M,w,f,sun and Check Blood Sugars 2 Hours After Meal 7)  Accu-Chek Soft Touch Lancets   Misc (Lancets) .... Use As Directed 8)  Simvastatin 40 Mg Tabs (Simvastatin) .Marland Kitchen.. 1 At Bedtime 9)  Glimepiride 2 Mg Tabs (Glimepiride) .... 1/2 Once Daily 10)  Carvedilol 25 Mg Tabs (Carvedilol) .... Take 1 Tab Two Times A Day 11)  Nitrostat 0.4 Mg Subl (Nitroglycerin) .Marland Kitchen.. 1  Tablet Under Tongue At Onset of Chest Pain; You May Repeat Every 5 Minutes For Up To 3 Doses. 12)  Indomethacin 25 Mg Caps (Indomethacin) .... Take 1 Capsule By Mouth Three Times A Day As Needed 13)  Methocarbamol 500 Mg Tabs (Methocarbamol) .... Take 1 Three Times A Day 14)  Gabapentin 100 Mg Caps (Gabapentin) .Marland Kitchen.. 1 Every 8 Hrs As Needed For Leg Pain 15)  Cozaar 50 Mg Tabs (Losartan Potassium) .... Take One Tablet By Mouth Two Times A Day 16)  Colcrys 0.6 Mg Tabs (Colchicine) .... Two Times A Day As Needed  Allergies (verified): 1)  ! Pcn  Past History:  Past Medical History: DM Hyperlipidemia Hypertension penicillin allergy .. pilonidal cyst Atrial fibrillation.... long-term high-dose amiodarone therapy multiple cardioversions.Marland Kitchen amiodarone stopped September 2009 Atrial flutter present... February 02, 2009 Left atrial clot but cardioversions done since that time. Claudication significant peripheral disease in the legs and question mesenteric disease Low back pain from spinal stenosis, DDD....surgery... Dr. Danielle Dess CAD  .Marland KitchenMarland KitchenCatheterization July 2008 vein grafts and LIMA patent..... 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 Georganna Skeans infected leg after bypass treated Nasal drainage  ...chronic Skin discoloration from amiodarone and easy bruisability from Coumadin and aspirin TSH.... borderline low Excess beer consumption.... but not a true alcohol problem Renal insufficiency creatinine 1.2-1.7 range ICD placement 2009 by Dr. Ladona Ridgel also  biventricular pacing.... flutter could not be ablated as it is left sided. Chronotropic incompetence pacer rate adjusted June 09 with improvement. 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 Shortness of breath    worsening recently.... November, 2011...chest x-ray with large left effusion pleural effusion   left.... thoracentesis in hospital... November, 2011.... exudative... cytology negative... seen by Dr.Wert... no proof of mesothelial... plan to follow Pericardial effusion   November, 2011.... decreased during hospitalization   Review of Systems       Patient denies fever, chills, headache, sweats, rash, change in vision, change in hearing, chest, cough, nausea vomiting, urinary symptoms all other systems are reviewed and are negative.  Vital Signs:  Patient profile:   70 year old male Height:      72 inches Weight:      239 pounds BMI:     32.53 Pulse rate:   75 / minute BP sitting:   142 / 88  (left arm) Cuff size:   regular  Vitals Entered By: Hardin Negus, RMA (April 07, 2010 2:18 PM)  Physical Exam  Eyes:  patient looks good day.no xanthelasma. Neck:  no jugular venous distention. Lungs:  lungs are clear.  Respiratory effort is unlabored. Heart:  cardiac exam reveals S1-S2.  No clicks or significant murmurs. Abdomen:  abdomen is protuberant. Extremities:  no peripheral edema. Psych:  patient is oriented to person time and place her affect is normal.    ICD Specifications Following MD:  [name], MD     Referring MD:  Dominie Benedick ICD Vendor:  [employer]     ICD Model Number:  [phone]     ICD Serial Number:  [id number] ICD DOI:  04/11/2007     ICD Implanting MD:  [name], MD  Lead 1:    Location: RA     DOI: 04/11/2007     Model #: 1478     Serial #: [id number]     Status: active Lead 2:    Location: RV     DOI: 04/11/2007     Model #: 5784     Serial #: [id number]     Status:  active Lead 3:    Location: LV     DOI: 04/11/2007     Model #: 1324     Serial #: [id number]     Status: active  Indications::  ICM, CHF   ICD Follow Up ICD Dependent:  No       ICD Device Measurements Configuration: LV TIP TO RV COIL  Episodes Coumadin:  Yes  Brady Parameters Mode DDDR     Lower Rate Limit:  60     Upper Rate Limit 130 PAV 200     Sensed AV Delay:  180  Tachy Zones VF:  200     VT:  250 FVT VIA VF     VT1:  176     Impression & Recommendations:  Problem # 1:  * PERICARDIAL EFFUSION The patient has no evidence of significant pericardial effusion. I chosen not to repeat his echo at this time.  Problem # 2:  ATRIAL FIBRILLATION (ICD-427.31)  His updated medication list for this problem  includes:    Adult Aspirin Ec Low Strength 81 Mg Tbec (Aspirin) .Marland Kitchen... 1 by mouth q week    Warfarin Sodium 2.5 Mg Tabs (Warfarin sodium) ..... Use as directed by anticoagualtion clinic    Carvedilol 25 Mg Tabs (Carvedilol) .Marland Kitchen... Take 1 tab two times a day Rate is controlled no change in therapy.  Problem # 3:  HYPERTENSION, ESSENTIAL NOS (ICD-401.9)  His updated medication list for this problem includes:    Adult Aspirin Ec Low Strength 81 Mg Tbec (Aspirin) .Marland Kitchen... 1 by mouth q week    Furosemide 40 Mg Tabs (Furosemide) .Marland Kitchen... Take 1 tab two times a day, may take an extra tab as needed    Carvedilol 25 Mg Tabs (Carvedilol) .Marland Kitchen... Take 1 tab two times a day    Cozaar 50 Mg Tabs (Losartan potassium) .Marland Kitchen... Take one tablet by mouth two times a day    Amlodipine Besylate 2.5 Mg Tabs (Amlodipine besylate) ..... Every other day at bedtime Blood pressure remains high in the morning and stable the rest of the day.  I decided to try him on nighttime amlodipine 2.5 mg every other night.  I will then see him for followup.  Patient Instructions: 1)  Amlodipine 2.5mg  every other day at bedtime 2)  Follow up in 1 month Prescriptions: COZAAR 50 MG TABS (LOSARTAN POTASSIUM) Take one tablet by  mouth two times a day  #180 x 3   Entered by:   Meredith Staggers, RN   Authorized by:   Talitha Givens, MD, [facility]   Signed by:   Meredith Staggers, RN on 04/07/2010   Method used:   Electronically to        [facility] (retail)       [address]       [place], [place]  [address]       Ph: [phone]       Fax: [phone]   RxID:   [id number]

## 2010-04-22 NOTE — Letter (Signed)
Summary: Remote Device Check  [facility], Main Office  [address]   [place], [place] [address]   Phone: [phone]  Fax: [phone]     March 02, 2010 MRN: [id number]   [name] [address] [place], [place]  [address]   Dear Mr. BOURQUE,   Your remote transmission was recieved and reviewed by your physician.  All diagnostics were within normal limits for you.  __X___Your next transmission is scheduled for:  05-06-2010.  Please transmit at any time this day.  If you have a wireless device your transmission will be sent automatically.    Sincerely,  Vella Kohler

## 2010-04-22 NOTE — Progress Notes (Signed)
Summary: low BP   Phone Note Call from Patient   Caller: pts Wife Summary of Call: pts wife called and stated she has noticed his BP has been low, Sat it was 70/50 Sun 70/51 so wife stopped amlodipine and benazepril, Mon it was better 116/76, today 137/93 and then after cozaar, lasix and coreg it dropped to 72/51, she states his wt is stable.  Have discussed w/Dr Myrtis Ser will hold lasix and cozaar tonight and in AM, will take only 1/2 carvedilol and will drink a little extra fluid today and call w/update tom. wife is agreeable and verbalizes understanding Initial call taken by: Meredith Staggers, RN,  March 09, 2010 10:59 AM

## 2010-04-22 NOTE — Progress Notes (Signed)
Summary: pt gaining weight   Phone Note Call from Patient Call back at Home Phone [phone]   Caller: Spouse Reason for Call: Talk to Nurse, Talk to Doctor Summary of Call: pt is gaining 2lbs a day Initial call taken by: Omer Jack,  February 23, 2010 9:17 AM  Follow-up for Phone Call        spoke w/pts wife, she reports pt wt has been steadily increasing, he has gained 8 lbs since the left the hospital on Thur, he does have some increased SOB, no edema that she can tell, she reports his BP is running high in AM and a little better in afternoon, around 149/100, 124/81, 148/97, 159/100, advised to increase furosemide back to 40mg  two times a day, she is agreeable and will call me in am w/update on his wt Meredith Staggers, RN  February 23, 2010 9:31 AM      Appended Document: pt gaining weight Please be sure you talk to them today and let me know.  Appended Document: pt gaining weight received vm from pt on Wed 12/7 stating wt was down 3 lbs BP was 159/114 HR 99, SOB was improved, have call pt today to check on him and Left message to call back   Appended Document: pt gaining weight spoke w/pts wife he is stable, Dr Myrtis Ser spoke w/them and added a new med on Thur, she states since then his BP has been much better 130/80, 126/82, 104/72, 120/85, pt is sch to see Dr Myrtis Ser on [place]. at 3:45

## 2010-04-22 NOTE — Progress Notes (Signed)
Summary: BP update   Phone Note Call from Patient   Summary of Call: pts wife called w/update, she states last pm BP was 136/97 and 139/89 this am it was 143/100 and then dropped to 119/80 after taking his meds, at lunch it was 115/87 and this afternoon 129/92, 115/80, advised to cont. same meds and cont. to monitor, if drops again to the 70s overweek can call on call MD she is agreeable will keep appt on Wed 12/28 w/Dr Boykin Nearing, RN  March 11, 2010 4:53 PM

## 2010-04-22 NOTE — Assessment & Plan Note (Signed)
Summary: f46m  Medications Added FUROSEMIDE 40 MG  TABS (FUROSEMIDE) Take 1 tab two times a day, may take an extra tab as needed CARVEDILOL 25 MG TABS (CARVEDILOL) Take 1 tab two times a day CARVEDILOL 25 MG TABS (CARVEDILOL) Take 1/2 tab two times a day      Allergies Added:   Visit Type:  Follow-up Referring Lekeisha Arenas:  Barnett Abu, MD  Malon Kindle, MD  Richard Ramos,MD  Dr. Danie Binder Neurolog[facility] Primary Josette Shimabukuro:  [name] MD  CC:  hypertension.  History of Present Illness: The patient is seen for followup of his multiple cardiac problems.  Most recently he had been hypertensive again and I had restarted some of his medicines.  I added enalapril by mistake to his Cozaar and we stopped.  He did not tolerate enalapril plus amlodipine at nighttime.  His blood pressure was low.  Both were put on hold.  His pressure is now are similar to what we had seen in the past.  He is hypertensive when he gets up in the morning.  Blood pressures and good during the rest of the day after he takes his medicines.  Current Medications (verified): 1)  Adult Aspirin Ec Low Strength 81 Mg  Tbec (Aspirin) .Marland Kitchen.. 1 By Mouth Q Week 2)  Warfarin Sodium 2.5 Mg Tabs (Warfarin Sodium) .... Use As Directed By Anticoagualtion Clinic 3)  Metformin Hcl 500 Mg  Tabs (Metformin Hcl) .Marland Kitchen.. 1qd Wih Largest Meal 4)  Klor-Con M20 20 Meq Cr-Tabs (Potassium Chloride Crys Cr) .... Take 1/2 Tablet Daily 5)  Furosemide 40 Mg  Tabs (Furosemide) .... Take 1 Tab Two Times A Day 6)  Accu-Chek Comfort Curve   Strp (Glucose Blood) .... Test M,w,f,sun and Check Blood Sugars 2 Hours After Meal 7)  Accu-Chek Soft Touch Lancets   Misc (Lancets) .... Use As Directed 8)  Simvastatin 40 Mg Tabs (Simvastatin) .Marland Kitchen.. 1 At Bedtime 9)  Glimepiride 2 Mg Tabs (Glimepiride) .... 1/2 Once Daily 10)  Carvedilol 25 Mg Tabs (Carvedilol) .... Take 1/2 Tab Two Times A Day 11)  Nitrostat 0.4 Mg Subl (Nitroglycerin) .Marland Kitchen.. 1 Tablet Under Tongue  At Onset of Chest Pain; You May Repeat Every 5 Minutes For Up To 3 Doses. 12)  Indomethacin 25 Mg Caps (Indomethacin) .... Take 1 Capsule By Mouth Three Times A Day As Needed 13)  Methocarbamol 500 Mg Tabs (Methocarbamol) .... Take 1 Three Times A Day 14)  Gabapentin 100 Mg Caps (Gabapentin) .Marland Kitchen.. 1 Every 8 Hrs As Needed For Leg Pain 15)  Cozaar 50 Mg Tabs (Losartan Potassium) .... Take One Tablet By Mouth Two Times A Day 16)  Colcrys 0.6 Mg Tabs (Colchicine) .... Two Times A Day  Allergies (verified): 1)  ! Pcn  Past History:  Past Medical History: DM Hyperlipidemia Hypertension penicillin allergy .. pilonidal cyst Atrial fibrillation.... long-term high-dose amiodarone therapy multiple cardioversions.Marland Kitchen amiodarone stopped September 2009 Atrial flutter present... February 02, 2009 Left atrial clot but cardioversions done since that time. Claudication significant peripheral disease in the legs and question mesenteric disease Low back pain from spinal stenosis, DDD....surgery... Dr. Danielle Dess CAD  .Marland KitchenMarland KitchenCatheterization July 2008 vein grafts and LIMA patent..... 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 Georganna Skeans infected leg after bypass treated Nasal drainage  ...chronic Skin discoloration from amiodarone and easy bruisability from Coumadin and aspirin TSH.... borderline low Excess beer consumption.... but not a true alcohol problem Renal insufficiency creatinine 1.2-1.7 range ICD placement 2009 by Dr. Ladona Ridgel also biventricular pacing.... flutter  could not be ablated as it is left sided. Chronotropic incompetence pacer rate adjusted June 09 with improvement. 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 Shortness of breath    worsening recently.... November, 2011...chest x-ray with large left effusion pleural effusion   left.... thoracentesis in hospital... November, 2011.... exudative... cytology negative... seen by Dr.Wert... no proof of mesothelial... plan to follow Pericardial effusion   November, 2011.... decreased during hospitalization   Review of Systems       Patient denies fever, chills, headache, sweats, rash, change in vision, change in hearing, chest pain, cough, nausea vomiting, urinary symptoms.  All other systems are reviewed and are negative.  Vital Signs:  Patient profile:   70 year old male Height:      72 inches Weight:      243 pounds BMI:     33.08 Pulse rate:   70 / minute BP sitting:   118 / 72  (left arm) Cuff size:   regular  Vitals Entered By: Hardin Negus, RMA (March 17, 2010 8:46 AM)  Physical Exam  General:  patient is stable today. Eyes:  no xanthelasma. Neck:  no jugulovenous distention. Lungs:  lungs reveal decreased breath sounds at the left base. Heart:  cardiac exam reveals S1-S2.  No clicks or significant murmurs. Abdomen:  abdomen is soft. Extremities:  no peripheral edema. Psych:  patient is oriented to person time and place.  Affect is normal.    ICD Specifications Following MD:  [name], MD     Referring MD:  KATZ ICD Vendor:  [employer]     ICD Model Number:  [id number]     ICD Serial Number:  [id number] ICD DOI:  04/11/2007     ICD Implanting MD:  [name], MD  Lead 1:    Location: RA     DOI: 04/11/2007     Model #: [id number]     Serial #: [id number]     Status: active Lead 2:    Location: RV     DOI: 04/11/2007     Model #: [id number]     Serial #: [id number]     Status: active Lead 3:     Location: LV     DOI: 04/11/2007     Model #: 7322     Serial #: [id number]     Status: active  Indications::  ICM, CHF   ICD Follow Up ICD Dependent:  No       ICD Device Measurements Configuration: LV TIP TO RV COIL  Episodes Coumadin:  Yes  Brady Parameters Mode DDDR     Lower Rate Limit:  60     Upper Rate Limit 130 PAV 200     Sensed AV Delay:  180  Tachy Zones VF:  200     VT:  250 FVT VIA VF     VT1:  176     Impression & Recommendations:  Problem # 1:  ATRIAL FIBRILLATION (ICD-427.31)  His updated medication list for this problem includes:    Adult Aspirin Ec Low Strength 81 Mg Tbec (Aspirin) .Marland KitchenMarland KitchenMarland KitchenMarland Kitchen 1  by mouth q week    Warfarin Sodium 2.5 Mg Tabs (Warfarin sodium) ..... Use as directed by anticoagualtion clinic    Carvedilol 25 Mg Tabs (Carvedilol) .Marland Kitchen... Take 1 tab two times a day Rate is controlled.  Problem # 2:  CORONARY ARTERY DISEASE (ICD-414.00)  The following medications were removed from the medication list:    Benazepril Hcl 10 Mg Tabs (Benazepril hcl) .Marland Kitchen... 1/2 tab once daily    Amlodipine Besylate 5 Mg Tabs (Amlodipine besylate) .Marland Kitchen... 1/2 tab at bedtime His updated medication list for this problem includes:    Adult Aspirin Ec Low Strength 81 Mg Tbec (Aspirin) .Marland Kitchen... 1 by mouth q week    Warfarin Sodium 2.5 Mg Tabs (Warfarin sodium) ..... Use as directed by anticoagualtion clinic    Carvedilol 25 Mg Tabs (Carvedilol) .Marland Kitchen... Take 1 tab two times a day    Nitrostat 0.4 Mg Subl (Nitroglycerin) .Marland Kitchen... 1 tablet under tongue at onset of chest pain; you may repeat every 5 minutes for up to 3 doses. CAD stable.  Problem # 3:  HYPERTENSION, ESSENTIAL NOS (ICD-401.9) I have chosen not to change his medicines today.  I've asked him to elevate the head of his bed at nighttime.  We will consider re\re addition of low-dose amlodipine at nighttime in the future.  Problem # 4:  BACK PAIN, LUMBAR (ICD-724.2) The patient is to have an injection and was back on January 10.   It is okay for his Coumadin to be held for this.  Patient Instructions: 1)  Increase Carvedilol to 1 full tab in PM for 1 week then increase to 1 full tab two times a day  2)  Follow up in 1 month--Jan 18th at 2:00pm Prescriptions: FUROSEMIDE 40 MG  TABS (FUROSEMIDE) Take 1 tab two times a day, may take an extra tab as needed  #270 x 3   Entered by:   Meredith Staggers, RN   Authorized by:   Talitha Givens, MD, [facility]   Signed by:   Meredith Staggers, RN on 03/17/2010   Method used:   Electronically to        [facility] (retail)       [address]       [place], [place]  [address]       Ph: [phone]       Fax: [phone]   RxID:   [id number]

## 2010-04-22 NOTE — Miscellaneous (Signed)
Clinical Lists Changes  Observations: Added new observation of PAST MED HX: DM Hyperlipidemia Hypertension penicillin allergy  pilonidal cyst Atrial fibrillation.... long-term high-dose amiodarone therapy multiple cardioversions.Marland Kitchen amiodarone stopped September 2009 Atrial flutter present... February 02, 2009 Left atrial clot but cardioversions done since that time. Claudication significant peripheral disease in the legs and question mesenteric disease Low back pain from spinal stenosis, DDD....surgery... Dr. Danielle Dess CAD  .Marland KitchenMarland KitchenCatheterization July 2008 vein grafts and LIMA patent..... 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 Georganna Skeans infected leg after bypass treated Nasal drainage  ...chronic Skin discoloration from amiodarone and easy bruisability from Coumadin and aspirin TSH.... borderline low Excess beer consumption.... but not a true alcohol problem Renal insufficiency creatinine 1.2-1.7 range ICD placement 2009 by Dr. Ladona Ridgel also biventricular pacing.... flutter could not be ablated as it is left sided. Chronotropic incompetence pacer rate adjusted June 09 with improvement. 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 Shortness of breath    worsening recently.... November, 2011...chest x-ray with large left effusion pleural effusion   left.... thoracentesis in hospital... November, 2011.... exudative... cytology negative... seen by Dr.Wert... no proof of  mesothelial... plan to follow Pericardial effusion   November, 2011.... decreased during hospitalization he is a       (03/03/2010 18:01) Added new observation of REFERRING MD: Barnett Abu, MD  Malon Kindle, MD  Richard Ramos,MD  Dr. Danie Binder Neurologic (03/03/2010 18:01) Added new observation of PRIMARY MD: [name] MD (03/03/2010 18:01)       Past History:  Past Medical History: DM Hyperlipidemia Hypertension penicillin allergy  pilonidal cyst Atrial fibrillation.... long-term high-dose amiodarone therapy multiple cardioversions.Marland Kitchen amiodarone stopped September 2009 Atrial flutter present... February 02, 2009 Left atrial clot but cardioversions done since that time. Claudication significant peripheral disease in the legs and question mesenteric disease Low back pain from spinal stenosis, DDD....surgery... Dr. Danielle Dess CAD  .Marland KitchenMarland KitchenCatheterization July 2008 vein grafts and LIMA patent..... 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 Georganna Skeans infected leg after bypass treated Nasal drainage  ...chronic Skin discoloration from amiodarone and easy bruisability from Coumadin and aspirin TSH.... borderline low Excess beer consumption.... but not a true alcohol problem Renal insufficiency creatinine 1.2-1.7 range ICD placement 2009 by Dr. Ladona Ridgel also biventricular pacing.... flutter could not be ablated as it is left sided. Chronotropic incompetence pacer rate adjusted June 09 with improvement. 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 Shortness of breath    worsening recently.... November, 2011...chest x-ray with large left effusion pleural effusion   left.... thoracentesis in hospital... November, 2011.... exudative... cytology negative... seen by Dr.Wert... no proof of mesothelial... plan to follow Pericardial effusion   November, 2011.... decreased during hospitalization he is a

## 2010-04-22 NOTE — Cardiovascular Report (Signed)
Summary: Office Visit Remote   Office Visit Remote   Imported By: Roderic Ovens 03/04/2010 14:11:19  _____________________________________________________________________  External Attachment:    Type:   Image     Comment:   External Document

## 2010-04-22 NOTE — Progress Notes (Signed)
Summary: BP   Phone Note Call from Patient   Caller: pts wife Summary of Call: pts wife called w/update reg. pts BP yest afternoon it came up to 94/64 and then 143/108 last evening, this am it was 135/90, 108/77 after taking 1/2 carevedilol and then 112/74 will discuss w/Dr Myrtis Ser and call her back Initial call taken by: Meredith Staggers, RN,  March 10, 2010 2:00 PM  Follow-up for Phone Call        per Dr Myrtis Ser resume carvedilol, losartan and furosemide at regular dose pts wife is aware and will touch base w/me tom. Meredith Staggers, RN  March 10, 2010 3:53 PM

## 2010-04-22 NOTE — Medication Information (Signed)
Summary: rov/tm   Anticoagulant Therapy  Managed by: Weston Brass, PharmD Referring MD: [name] MD PCP: [name] MD Supervising MD: Graciela Husbands MD, Viviann Spare Indication 1: Atrial Fibrillation (ICD-427.31) Lab Used: [facility]  Site: [facility] INR POC 2.5 INR RANGE 2 - 3  Dietary changes: no    Health status changes: no    Bleeding/hemorrhagic complications: no    Recent/future hospitalizations: no    Any changes in medication regimen? yes       Details: restarting amlodipine and benazepril  Recent/future dental: no  Any missed doses?: no       Is patient compliant with meds? yes       Allergies: 1)  ! Pcn  Anticoagulation Management History:      The patient is taking warfarin and comes in today for a routine follow up visit.  Positive risk factors for bleeding include an age of 69 years or older and presence of serious comorbidities.  Negative risk factors for bleeding include no history of CVA/TIA.  The bleeding index is 'intermediate risk'.  Positive CHADS2 values include History of CHF, History of HTN, and History of Diabetes.  Negative CHADS2 values include Age > 50 years old and Prior Stroke/CVA/TIA.  The start date was 02/15/2002.  His last INR was 1.6 RATIO.  Anticoagulation responsible provider: Graciela Husbands MD, Viviann Spare.  INR POC: 2.5.  Cuvette Lot#: [id number].  Exp: 04/2011.    Anticoagulation Management Assessment/Plan:      The patient's current anticoagulation dose is Warfarin sodium 2.5 mg tabs: Use as directed by Anticoagualtion Clinic.  The target INR is 2 - 3.  The next INR is due 03/17/2010.  Anticoagulation instructions were given to patient.  Results were reviewed/authorized by Weston Brass, PharmD.  He was notified by Weston Brass PharmD.         Prior Anticoagulation Instructions: INR 2.0 Continue 2.5mg s everyday except 5mg s on Wednesdays and Saturdays. Recheck in 10 days. Discontinue Lovenox injections.   Current Anticoagulation Instructions: INR  2.5  Continue same dose of 1 tablet every day except 2 tablets on Wednesday and Saturday.  Recheck INR 4 weeks.

## 2010-05-03 DIAGNOSIS — I4891 Unspecified atrial fibrillation: Secondary | ICD-10-CM

## 2010-05-03 DIAGNOSIS — Z7901 Long term (current) use of anticoagulants: Secondary | ICD-10-CM | POA: Insufficient documentation

## 2010-05-10 ENCOUNTER — Encounter (INDEPENDENT_AMBULATORY_CARE_PROVIDER_SITE_OTHER): Payer: Self-pay | Admitting: *Deleted

## 2010-05-11 ENCOUNTER — Ambulatory Visit (INDEPENDENT_AMBULATORY_CARE_PROVIDER_SITE_OTHER): Payer: Medicare Other | Admitting: Cardiology

## 2010-05-11 ENCOUNTER — Encounter: Payer: Self-pay | Admitting: Cardiology

## 2010-05-11 ENCOUNTER — Encounter (INDEPENDENT_AMBULATORY_CARE_PROVIDER_SITE_OTHER): Payer: Medicare Other

## 2010-05-11 ENCOUNTER — Encounter: Payer: Self-pay | Admitting: Cardiovascular Disease

## 2010-05-11 DIAGNOSIS — I4891 Unspecified atrial fibrillation: Secondary | ICD-10-CM

## 2010-05-11 DIAGNOSIS — R0602 Shortness of breath: Secondary | ICD-10-CM | POA: Insufficient documentation

## 2010-05-11 DIAGNOSIS — Z7901 Long term (current) use of anticoagulants: Secondary | ICD-10-CM

## 2010-05-11 DIAGNOSIS — I10 Essential (primary) hypertension: Secondary | ICD-10-CM

## 2010-05-18 NOTE — Letter (Signed)
Summary: Device-Delinquent Phone Journalist, newspaper, Main Office  [address]   [place], [place] [address]   Phone: [phone]  Fax: [phone]     May 10, 2010 MRN: [id number]   BANDON SHERWIN [address] [place], [place]  [address]   Dear Mr. TOON,  According to our records, you were scheduled for a device phone transmission on 05-06-2010.     We did not receive any results from this check.  If you transmitted on your scheduled day, please call us to help troubleshoot your system.  If you forgot to send your transmission, please send one upon receipt of this letter.  Thank you,   Architectural technologist [facility]

## 2010-05-18 NOTE — Medication Information (Signed)
Summary: Coumadin Clinic   Anticoagulant Therapy  Managed by: Weston Brass, PharmD Referring MD: [name] MD PCP: [name] MD Supervising MD: Excell Seltzer MD, Casimiro Needle Indication 1: Atrial Fibrillation (ICD-427.31) Lab Used: [facility] Site: [facility] INR POC 2.3 INR RANGE 2 - 3  Dietary changes: no    Health status changes: no    Bleeding/hemorrhagic complications: no    Recent/future hospitalizations: no    Any changes in medication regimen? no    Recent/future dental: no  Any missed doses?: no       Is patient compliant with meds? yes       Allergies: 1)  ! Pcn  Anticoagulation Management History:      The patient is taking warfarin and comes in today for a routine follow up visit.  Positive risk factors for bleeding include an age of 70 years or older and presence of serious comorbidities.  Negative risk factors for bleeding include no history of CVA/TIA.  The bleeding index is 'intermediate risk'.  Positive CHADS2 values include History of CHF, History of HTN, and History of Diabetes.  Negative CHADS2 values include Age > 43 years old and Prior Stroke/CVA/TIA.  The start date was 02/15/2002.  His last INR was 1.6 RATIO.  Anticoagulation responsible provider: Excell Seltzer MD, Casimiro Needle.  INR POC: 2.3.  Cuvette Lot#: [id number].  Exp: 03/2011.    Anticoagulation Management Assessment/Plan:      The patient's current anticoagulation dose is Warfarin sodium 2.5 mg tabs: Use as directed by Anticoagualtion Clinic.  The target INR is 2 - 3.  The next INR is due 06/08/2010.  Anticoagulation instructions were given to patient.  Results were reviewed/authorized by Weston Brass, PharmD.  He was notified by Margot Chimes PharmD Candidate.         Prior Anticoagulation Instructions: INR 1.8 Tomorrow take 2 pill then resume 1 pill everyday except 2 pills on Wednesdays and Saturdays. Recheck in 4 weeks.   Current Anticoagulation Instructions: INR 2.3  Continue to take 1 tablet  everyday except for on Wednesdays and Saturdays when you take 2 tablets.  Recheck INR in 4 weeks.
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
## 2010-05-29 ENCOUNTER — Encounter: Payer: Self-pay | Admitting: Cardiology

## 2010-05-31 LAB — CBC
HCT: 35.6 % — ABNORMAL LOW (ref 39.0–52.0)
HCT: 37.4 % — ABNORMAL LOW (ref 39.0–52.0)
Hemoglobin: 11.4 g/dL — ABNORMAL LOW (ref 13.0–17.0)
Hemoglobin: 12.1 g/dL — ABNORMAL LOW (ref 13.0–17.0)
MCH: 29.5 pg (ref 26.0–34.0)
MCHC: 32 g/dL (ref 30.0–36.0)
MCV: 92.2 fL (ref 78.0–100.0)
WBC: 7.1 10*3/uL (ref 4.0–10.5)

## 2010-05-31 LAB — BASIC METABOLIC PANEL
CO2: 26 mEq/L (ref 19–32)
Glucose, Bld: 124 mg/dL — ABNORMAL HIGH (ref 70–99)
Potassium: 4.5 mEq/L (ref 3.5–5.1)
Sodium: 143 mEq/L (ref 135–145)

## 2010-05-31 LAB — GLUCOSE, CAPILLARY: Glucose-Capillary: 94 mg/dL (ref 70–99)

## 2010-06-01 LAB — CARDIAC PANEL(CRET KIN+CKTOT+MB+TROPI)
CK, MB: 0.6 ng/mL (ref 0.3–4.0)
CK, MB: 0.6 ng/mL (ref 0.3–4.0)
CK, MB: 0.6 ng/mL (ref 0.3–4.0)
Relative Index: INVALID (ref 0.0–2.5)
Total CK: 19 U/L (ref 7–232)
Total CK: 20 U/L (ref 7–232)
Troponin I: 0.01 ng/mL (ref 0.00–0.06)
Troponin I: 0.02 ng/mL (ref 0.00–0.06)

## 2010-06-01 LAB — BRAIN NATRIURETIC PEPTIDE: Pro B Natriuretic peptide (BNP): 271 pg/mL — ABNORMAL HIGH (ref 0.0–100.0)

## 2010-06-01 LAB — PROTIME-INR
INR: 1.17 (ref 0.00–1.49)
INR: 1.21 (ref 0.00–1.49)
INR: 1.24 (ref 0.00–1.49)
INR: 1.74 — ABNORMAL HIGH (ref 0.00–1.49)
INR: 2.5 — ABNORMAL HIGH (ref 0.00–1.49)
Prothrombin Time: 15.5 seconds — ABNORMAL HIGH (ref 11.6–15.2)
Prothrombin Time: 15.8 seconds — ABNORMAL HIGH (ref 11.6–15.2)

## 2010-06-01 LAB — COMPREHENSIVE METABOLIC PANEL
AST: 13 U/L (ref 0–37)
Albumin: 3.2 g/dL — ABNORMAL LOW (ref 3.5–5.2)
Alkaline Phosphatase: 71 U/L (ref 39–117)
Chloride: 100 mEq/L (ref 96–112)
Creatinine, Ser: 1.78 mg/dL — ABNORMAL HIGH (ref 0.4–1.5)
GFR calc Af Amer: 46 mL/min — ABNORMAL LOW (ref >60)
Potassium: 3.2 mEq/L — ABNORMAL LOW (ref 3.5–5.1)
Total Bilirubin: 0.7 mg/dL (ref 0.3–1.2)

## 2010-06-01 LAB — GLUCOSE, CAPILLARY
Glucose-Capillary: 102 mg/dL — ABNORMAL HIGH (ref 70–99)
Glucose-Capillary: 103 mg/dL — ABNORMAL HIGH (ref 70–99)
Glucose-Capillary: 107 mg/dL — ABNORMAL HIGH (ref 70–99)
Glucose-Capillary: 125 mg/dL — ABNORMAL HIGH (ref 70–99)
Glucose-Capillary: 241 mg/dL — ABNORMAL HIGH (ref 70–99)
Glucose-Capillary: 64 mg/dL — ABNORMAL LOW (ref 70–99)
Glucose-Capillary: 77 mg/dL (ref 70–99)
Glucose-Capillary: 85 mg/dL (ref 70–99)
Glucose-Capillary: 89 mg/dL (ref 70–99)
Glucose-Capillary: 94 mg/dL (ref 70–99)
Glucose-Capillary: 95 mg/dL (ref 70–99)
Glucose-Capillary: 99 mg/dL (ref 70–99)

## 2010-06-01 LAB — HEPARIN LEVEL (UNFRACTIONATED)
Heparin Unfractionated: 0.14 IU/mL — ABNORMAL LOW (ref 0.30–0.70)
Heparin Unfractionated: 0.2 IU/mL — ABNORMAL LOW (ref 0.30–0.70)
Heparin Unfractionated: 0.29 IU/mL — ABNORMAL LOW (ref 0.30–0.70)
Heparin Unfractionated: 0.33 IU/mL (ref 0.30–0.70)
Heparin Unfractionated: <0.1 IU/mL — ABNORMAL LOW (ref 0.30–0.70)

## 2010-06-01 LAB — CBC
HCT: 37.2 % — ABNORMAL LOW (ref 39.0–52.0)
HCT: 38.8 % — ABNORMAL LOW (ref 39.0–52.0)
HCT: 38.8 % — ABNORMAL LOW (ref 39.0–52.0)
Hemoglobin: 12.3 g/dL — ABNORMAL LOW (ref 13.0–17.0)
Hemoglobin: 12.3 g/dL — ABNORMAL LOW (ref 13.0–17.0)
Hemoglobin: 12.6 g/dL — ABNORMAL LOW (ref 13.0–17.0)
MCH: 29.6 pg (ref 26.0–34.0)
MCH: 29.8 pg (ref 26.0–34.0)
MCH: 30.1 pg (ref 26.0–34.0)
MCHC: 32.5 g/dL (ref 30.0–36.0)
MCHC: 32.8 g/dL (ref 30.0–36.0)
MCHC: 33.1 g/dL (ref 30.0–36.0)
MCHC: 33.2 g/dL (ref 30.0–36.0)
MCV: 91.1 fL (ref 78.0–100.0)
Platelets: 185 10*3/uL (ref 150–400)
Platelets: 213 10*3/uL (ref 150–400)
RBC: 3.92 MIL/uL — ABNORMAL LOW (ref 4.22–5.81)
RBC: 4.11 MIL/uL — ABNORMAL LOW (ref 4.22–5.81)
RBC: 4.12 MIL/uL — ABNORMAL LOW (ref 4.22–5.81)
RBC: 4.25 MIL/uL (ref 4.22–5.81)
RDW: 13.7 % (ref 11.5–15.5)
WBC: 7.8 10*3/uL (ref 4.0–10.5)
WBC: 7.9 10*3/uL (ref 4.0–10.5)

## 2010-06-01 LAB — BASIC METABOLIC PANEL
CO2: 25 mEq/L (ref 19–32)
CO2: 26 mEq/L (ref 19–32)
Calcium: 8.7 mg/dL (ref 8.4–10.5)
Calcium: 8.8 mg/dL (ref 8.4–10.5)
Calcium: 9 mg/dL (ref 8.4–10.5)
Creatinine, Ser: 1.23 mg/dL (ref 0.4–1.5)
GFR calc Af Amer: >60 mL/min (ref >60)
GFR calc Af Amer: >60 mL/min (ref >60)
GFR calc Af Amer: >60 mL/min (ref >60)
GFR calc non Af Amer: 59 mL/min — ABNORMAL LOW (ref >60)
GFR calc non Af Amer: >60 mL/min (ref >60)
GFR calc non Af Amer: >60 mL/min (ref >60)
Glucose, Bld: 95 mg/dL (ref 70–99)
Potassium: 3.7 mEq/L (ref 3.5–5.1)
Potassium: 3.7 mEq/L (ref 3.5–5.1)
Sodium: 138 mEq/L (ref 135–145)
Sodium: 140 mEq/L (ref 135–145)
Sodium: 142 mEq/L (ref 135–145)

## 2010-06-01 LAB — LACTATE DEHYDROGENASE, PLEURAL OR PERITONEAL FLUID: LD, Fluid: 302 U/L — ABNORMAL HIGH (ref 3–23)

## 2010-06-01 LAB — BODY FLUID CELL COUNT WITH DIFFERENTIAL
Lymphs, Fluid: 86 %
Other Cells, Fluid: 0 %

## 2010-06-01 LAB — BODY FLUID CULTURE
Gram Stain: NONE SEEN
Special Requests: 120

## 2010-06-01 LAB — URIC ACID: Uric Acid, Serum: 8.5 mg/dL — ABNORMAL HIGH (ref 4.0–7.8)

## 2010-06-01 LAB — D-DIMER, QUANTITATIVE: D-Dimer, Quant: 3.28 ug/mL-FEU — ABNORMAL HIGH (ref 0.00–0.48)

## 2010-06-06 LAB — GLUCOSE, CAPILLARY: Glucose-Capillary: 153 mg/dL — ABNORMAL HIGH (ref 70–99)

## 2010-06-08 ENCOUNTER — Encounter: Payer: Self-pay | Admitting: Internal Medicine

## 2010-06-08 ENCOUNTER — Ambulatory Visit (INDEPENDENT_AMBULATORY_CARE_PROVIDER_SITE_OTHER): Payer: Medicare Other | Admitting: Internal Medicine

## 2010-06-08 VITALS — BP 130/68 | HR 79 | Temp 97.9°F | Wt 243.6 lb

## 2010-06-08 DIAGNOSIS — R05 Cough: Secondary | ICD-10-CM

## 2010-06-08 DIAGNOSIS — J209 Acute bronchitis, unspecified: Secondary | ICD-10-CM

## 2010-06-08 MED ORDER — HYDROCODONE-HOMATROPINE 5-1.5 MG/5ML PO SYRP: 5.0000 mL | ORAL_SOLUTION | Freq: Four times a day (QID) | ORAL | Status: DC | PRN

## 2010-06-08 MED ORDER — AZITHROMYCIN 250 MG PO TABS: ORAL_TABLET | ORAL | Status: AC

## 2010-06-08 MED ORDER — AZITHROMYCIN 250 MG PO TABS: ORAL_TABLET | ORAL | Status: DC

## 2010-06-08 NOTE — Progress Notes (Signed)
  Subjective:    Patient ID: [name], male    DOB: Dec 31, 1940, 70 y.o.   MRN: [id number]  HPI Comments:  He developed a cough which has been essentially nonproductive approximately one week ago. He denies any associated fever. He's had no frontal headaches, facial pain, and  enlarged  lymph nodes. He has  used Mucinex DM for the cough. It has been severe enough that he does have some posterior thoracic pain.  He denies any pleuritic pain. He has not coughed up any blood. He's had minimal dyspnea; his wife describes some wheezing. He has had no purulence secretions from his head or chest. He quit smoking in 1996.  He has no past medical history of asthma.   Significantly he is on Coumadin. He is to have a PT/INR in 48 hours.    He exhibits no increased work of breathing; he is in no distress. There is no sign of  conjunctivitis clinically. Nares are dry without exudate. There is no erythema the oropharynx. He has complete dentures. The canals are clear and there there is no erythema the tympanic membranes. No lymphadenopathy about the head neck or axilla. Breath sounds are slightly decreased; he has no rubs rhonchi or splinting. Heart rhythm is slightly slow and minimally irregular without significant murmurs or gallops. He exhibits no significant edema. There is no clubbing or cyanosis present.  Cough      Review of Systems  Respiratory: Positive for cough.        Objective:   Physical Exam    he exhibits a     Assessment & Plan:

## 2010-06-08 NOTE — Patient Instructions (Signed)
Drink to thirst up to 40 ounces of water per day. Avoid milk or dairy which will thicken secretions. It is critical that you have your prothrombin time checked in 48 hours as scheduled. The Zithromax may raise the bleeding time.

## 2010-06-08 NOTE — Progress Notes (Signed)
Addended by: Stephan Minister on: 06/08/2010 03:06 PM   Modules accepted: Orders

## 2010-06-10 ENCOUNTER — Encounter: Payer: Self-pay | Admitting: Cardiology

## 2010-06-10 ENCOUNTER — Ambulatory Visit (INDEPENDENT_AMBULATORY_CARE_PROVIDER_SITE_OTHER): Payer: Medicare Other | Admitting: *Deleted

## 2010-06-10 ENCOUNTER — Ambulatory Visit (INDEPENDENT_AMBULATORY_CARE_PROVIDER_SITE_OTHER): Payer: Medicare Other | Admitting: Cardiology

## 2010-06-10 ENCOUNTER — Ambulatory Visit: Payer: Medicare Other | Admitting: Cardiology

## 2010-06-10 DIAGNOSIS — E8779 Other fluid overload: Secondary | ICD-10-CM

## 2010-06-10 DIAGNOSIS — M4800 Spinal stenosis, site unspecified: Secondary | ICD-10-CM | POA: Insufficient documentation

## 2010-06-10 DIAGNOSIS — I513 Intracardiac thrombosis, not elsewhere classified: Secondary | ICD-10-CM | POA: Insufficient documentation

## 2010-06-10 DIAGNOSIS — I4891 Unspecified atrial fibrillation: Secondary | ICD-10-CM

## 2010-06-10 DIAGNOSIS — J90 Pleural effusion, not elsewhere classified: Secondary | ICD-10-CM

## 2010-06-10 DIAGNOSIS — I2510 Atherosclerotic heart disease of native coronary artery without angina pectoris: Secondary | ICD-10-CM | POA: Insufficient documentation

## 2010-06-10 DIAGNOSIS — M109 Gout, unspecified: Secondary | ICD-10-CM | POA: Insufficient documentation

## 2010-06-10 DIAGNOSIS — Z7901 Long term (current) use of anticoagulants: Secondary | ICD-10-CM

## 2010-06-10 DIAGNOSIS — I358 Other nonrheumatic aortic valve disorders: Secondary | ICD-10-CM | POA: Insufficient documentation

## 2010-06-10 DIAGNOSIS — E785 Hyperlipidemia, unspecified: Secondary | ICD-10-CM | POA: Insufficient documentation

## 2010-06-10 DIAGNOSIS — I4892 Unspecified atrial flutter: Secondary | ICD-10-CM | POA: Insufficient documentation

## 2010-06-10 DIAGNOSIS — I10 Essential (primary) hypertension: Secondary | ICD-10-CM | POA: Insufficient documentation

## 2010-06-10 DIAGNOSIS — I509 Heart failure, unspecified: Secondary | ICD-10-CM | POA: Insufficient documentation

## 2010-06-10 DIAGNOSIS — Z95810 Presence of automatic (implantable) cardiac defibrillator: Secondary | ICD-10-CM | POA: Insufficient documentation

## 2010-06-10 DIAGNOSIS — I4589 Other specified conduction disorders: Secondary | ICD-10-CM | POA: Insufficient documentation

## 2010-06-10 DIAGNOSIS — J449 Chronic obstructive pulmonary disease, unspecified: Secondary | ICD-10-CM | POA: Insufficient documentation

## 2010-06-10 DIAGNOSIS — I714 Abdominal aortic aneurysm, without rupture: Secondary | ICD-10-CM | POA: Insufficient documentation

## 2010-06-10 DIAGNOSIS — I429 Cardiomyopathy, unspecified: Secondary | ICD-10-CM | POA: Insufficient documentation

## 2010-06-10 DIAGNOSIS — I340 Nonrheumatic mitral (valve) insufficiency: Secondary | ICD-10-CM | POA: Insufficient documentation

## 2010-06-10 DIAGNOSIS — N289 Disorder of kidney and ureter, unspecified: Secondary | ICD-10-CM | POA: Insufficient documentation

## 2010-06-10 DIAGNOSIS — R0602 Shortness of breath: Secondary | ICD-10-CM | POA: Insufficient documentation

## 2010-06-10 LAB — POCT INR: INR: 2

## 2010-06-10 NOTE — Assessment & Plan Note (Signed)
Fluid status is stable.  No change in therapy. 

## 2010-06-10 NOTE — Patient Instructions (Signed)
INR 2.0  Continue same dose of 1 tablet every day except 2 tablets on Wednesday and Saturday.  Recheck INR in 4 weeks.

## 2010-06-10 NOTE — Patient Instructions (Signed)
Your physician recommends that you schedule a follow-up appointment in: 6 weeks  

## 2010-06-10 NOTE — Progress Notes (Signed)
HPI The patient returns for follow up of his hypertension.  He has a very complex cardiovascular history.  Recently he had bronchitis and this was treated by Dr.Hopper.  He is feeling much better.  He does have some mild persistent shortness of breath.  I am hopeful that this will improve as he improves further from his episode of bronchitis. Allergies  Allergen Reactions  . Benazepril Hcl     REACTION: cough  . Meperidine Hcl   . Penicillins     REACTION: anaphylaxis    Current Outpatient Prescriptions  Medication Sig Dispense Refill  . amLODipine (NORVASC) 2.5 MG tablet Take 2.5 mg by mouth daily.        Marland Kitchen aspirin 81 MG tablet Take 81 mg by mouth once a week.        Marland Kitchen azithromycin (ZITHROMAX Z-PAK) 250 MG tablet Take 2 tablets by mouth on day 1, followed by 1 tablet by mouth daily for 4 days. Take 2 tablets (500 mg) on  Day 1,  followed by 1 tablet  6 each  0  . carvedilol (COREG) 25 MG tablet Take 25 mg by mouth 2 (two) times daily with a meal.        . colchicine 0.6 MG tablet Take 0.6 mg by mouth as needed.        . furosemide (LASIX) 40 MG tablet Take 40 mg by mouth daily. May take extra tablet if needed.      . gabapentin (NEURONTIN) 100 MG capsule Take 100 mg by mouth 2 (two) times daily.       Marland Kitchen glimepiride (AMARYL) 2 MG tablet 1/2 tablet daily      . indomethacin (INDOCIN) 25 MG capsule Take 25 mg by mouth 3 (three) times daily as needed.        Marland Kitchen losartan (COZAAR) 50 MG tablet Take 50 mg by mouth 2 (two) times daily.        . metFORMIN (GLUCOPHAGE) 500 MG tablet Take 500 mg by mouth daily.        . methocarbamol (ROBAXIN) 500 MG tablet Take 500 mg by mouth 2 (two) times daily.       . nitroGLYCERIN (NITROSTAT) 0.4 MG SL tablet Place 0.4 mg under the tongue every 5 (five) minutes as needed.        . potassium chloride (KLOR-CON) 20 MEQ packet Take 10 mEq by mouth. 1/2 by mouth daily      . simvastatin (ZOCOR) 40 MG tablet Take 40 mg by mouth at bedtime.        Marland Kitchen warfarin  (COUMADIN) 2.5 MG tablet Take by mouth as directed.        Marland Kitchen DISCONTD: HYDROcodone-homatropine (HYCODAN) 5-1.5 MG/5ML syrup Take 5 mLs by mouth every 6 (six) hours as needed for cough.  120 mL  0    History   Social History  . Marital Status: Married    Spouse Name: N/A    Number of Children: N/A  . Years of Education: N/A   Occupational History  . Not on file.   Social History Main Topics  . Smoking status: Former Games developer  . Smokeless tobacco: Former Neurosurgeon  . Alcohol Use: 0.0 oz/week     beer  . Drug Use: No  . Sexually Active: Not on file   Other Topics Concern  . Not on file   Social History Narrative  . No narrative on file    Family History  Problem Relation Age of Onset  .  Hypertension Mother   . Stroke Mother   . Diabetes Father   . Coronary artery disease Father     Past Medical History  Diagnosis Date  . Diabetes mellitus   . Hyperlipidemia   . Hypertension   . Pilonidal cyst   . Atrial fibrillation     Previous long-term amiodarone therapy with multiple cardioversions / amiodarone stopped September, 2009  . Atrial flutter     Started November, 2010, Left-sided and cannot ablate  . Left atrial thrombus     Remote past... cardioversions done since that time  . COPD (chronic obstructive pulmonary disease)     PFTs.. relatively good function 2010  . Wide-complex tachycardia   . Left ventricular ejection fraction less than 40%   . Gout   . AAA (abdominal aortic aneurysm)     Surgical repair  . Discolored skin   . S/P ICD (internal cardiac defibrillator) procedure     Dr. Ladona Ridgel 2009... by the pacing  . SOB (shortness of breath)     Large left effusion/ thoracentesis/hospitalization/November, 2011... exudated.. cytology negative.. Dr.Wert.. no proof of mesothelioma  . Pericardial effusion   . Pleural effusion   . Renal insufficiency   . S/P AAA repair   . Spinal stenosis     Surgery Dr.Elsner  . CAD (coronary artery disease)     Catheterization  July, 2008... name and vein grafts patent but low cardiac output  . Warfarin anticoagulation   . Cardiomyopathy     Ischemic... ICD  . CHF (congestive heart failure)     EF 30-40%... echo.. November, 2008  /  EF 55-60% echo... November, 2011  . Venous insufficiency     Toe discoloration chronic  . Mitral regurgitation     Mild echo  . Aortic valve sclerosis   . Nasal drainage     Chronic  . Alcohol ingestion of more than four drinks per week     Excess beer  not a dependency problem  . Chronotropic incompetence     IV pacing rate adjusted  . Thrombophlebitis of superficial veins of upper extremities     Possible venous stenosis from defibrillator  . Pericardial effusion     November, 2011 .. decreased during hospitalization    Past Surgical History  Procedure Date  . Colonoscopy w/ polypectomy   . Abdominal aortic aneurysm repair   . Coronary artery bypass graft   . Icd      insertion  . Lumbar fusion   . Pilonidal cyst removal   . Surgery scrotal / testicular     ROS  Patient denies fever, chills, headache, sweats, rash, change in vision, change in hearing, chest pain, cough, nausea vomiting, urinary symptoms.  All other systems are reviewed and are negative. PHYSICAL EXAM Patient is oriented to person time and place.  Affect is normal.  He is here with his wife today.  Head is atraumatic.  There is no xanthelasma.  Lungs are clear.  Respiratory effort is not labored.  Cardiac exam reveals S1 and S2.  No clicks or significant murmurs.  The abdomen is protuberant but soft.  He has no significant peripheral edema.  There no musculoskeletal deformities. Filed Vitals:   06/10/10 1401  BP: 100/70  Pulse: 70  Resp: 16  Weight: 242 lb (109.77 kg)    EKG   EKG is not done today.

## 2010-06-10 NOTE — Assessment & Plan Note (Signed)
I have not done a followup chest x-ray after his hospitalization.  He appears to be stable.  We'll see him back in 6 weeks.

## 2010-06-10 NOTE — Assessment & Plan Note (Signed)
Most recently I increased his evening dose of amlodipine to 2.5 mg each night.  He brought in his blood pressures today.  He continues to have a diastolic pressure in the range of 95-100 in the morning.  However this day goes on his pressure comes down.  Most recently he's had some pressures as low as 110 systolic.  It will be important for him to not go lower.  Plan for no change in his therapy at this time.

## 2010-06-22 ENCOUNTER — Other Ambulatory Visit: Payer: Self-pay | Admitting: Internal Medicine

## 2010-06-27 LAB — BASIC METABOLIC PANEL
BUN: 16 mg/dL (ref 6–23)
BUN: 18 mg/dL (ref 6–23)
BUN: 19 mg/dL (ref 6–23)
BUN: 22 mg/dL (ref 6–23)
BUN: 17 mg/dL (ref 6–23)
BUN: 24 mg/dL — ABNORMAL HIGH (ref 6–23)
CO2: 27 mEq/L (ref 19–32)
CO2: 28 mEq/L (ref 19–32)
CO2: 23 mEq/L (ref 19–32)
Calcium: 7.8 mg/dL — ABNORMAL LOW (ref 8.4–10.5)
Calcium: 8.2 mg/dL — ABNORMAL LOW (ref 8.4–10.5)
Calcium: 8.4 mg/dL (ref 8.4–10.5)
Calcium: 8.6 mg/dL (ref 8.4–10.5)
Calcium: 7.9 mg/dL — ABNORMAL LOW (ref 8.4–10.5)
Calcium: 9.6 mg/dL (ref 8.4–10.5)
Chloride: 101 mEq/L (ref 96–112)
Chloride: 105 mEq/L (ref 96–112)
Chloride: 106 mEq/L (ref 96–112)
Chloride: 106 mEq/L (ref 96–112)
Chloride: 109 mEq/L (ref 96–112)
Chloride: 109 mEq/L (ref 96–112)
Chloride: 110 mEq/L (ref 96–112)
Creatinine, Ser: 1.14 mg/dL (ref 0.4–1.5)
Creatinine, Ser: 1.16 mg/dL (ref 0.4–1.5)
Creatinine, Ser: 1.21 mg/dL (ref 0.4–1.5)
Creatinine, Ser: 1.24 mg/dL (ref 0.4–1.5)
Creatinine, Ser: 1.25 mg/dL (ref 0.4–1.5)
Creatinine, Ser: 1.34 mg/dL (ref 0.4–1.5)
Creatinine, Ser: 1.67 mg/dL — ABNORMAL HIGH (ref 0.4–1.5)
GFR calc Af Amer: 33 mL/min — ABNORMAL LOW (ref >60)
GFR calc Af Amer: >60 mL/min (ref >60)
GFR calc Af Amer: >60 mL/min (ref >60)
GFR calc Af Amer: >60 mL/min (ref >60)
GFR calc Af Amer: >60 mL/min (ref >60)
GFR calc Af Amer: >60 mL/min (ref >60)
GFR calc Af Amer: 50 mL/min — ABNORMAL LOW (ref >60)
GFR calc non Af Amer: 29 mL/min — ABNORMAL LOW (ref >60)
GFR calc non Af Amer: 58 mL/min — ABNORMAL LOW (ref >60)
GFR calc non Af Amer: >60 mL/min (ref >60)
GFR calc non Af Amer: >60 mL/min (ref >60)
GFR calc non Af Amer: >60 mL/min (ref >60)
GFR calc non Af Amer: 53 mL/min — ABNORMAL LOW (ref >60)
Glucose, Bld: 120 mg/dL — ABNORMAL HIGH (ref 70–99)
Glucose, Bld: 80 mg/dL (ref 70–99)
Potassium: 3.4 mEq/L — ABNORMAL LOW (ref 3.5–5.1)
Potassium: 3.5 mEq/L (ref 3.5–5.1)
Potassium: 3.8 mEq/L (ref 3.5–5.1)
Potassium: 4 mEq/L (ref 3.5–5.1)
Potassium: 4.2 mEq/L (ref 3.5–5.1)
Potassium: 5.4 mEq/L — ABNORMAL HIGH (ref 3.5–5.1)
Sodium: 132 mEq/L — ABNORMAL LOW (ref 135–145)
Sodium: 136 mEq/L (ref 135–145)
Sodium: 138 mEq/L (ref 135–145)
Sodium: 139 mEq/L (ref 135–145)
Sodium: 143 mEq/L (ref 135–145)
Sodium: 143 mEq/L (ref 135–145)

## 2010-06-27 LAB — GLUCOSE, CAPILLARY
Glucose-Capillary: 109 mg/dL — ABNORMAL HIGH (ref 70–99)
Glucose-Capillary: 167 mg/dL — ABNORMAL HIGH (ref 70–99)
Glucose-Capillary: 69 mg/dL — ABNORMAL LOW (ref 70–99)
Glucose-Capillary: 70 mg/dL (ref 70–99)
Glucose-Capillary: 77 mg/dL (ref 70–99)
Glucose-Capillary: 84 mg/dL (ref 70–99)
Glucose-Capillary: 135 mg/dL — ABNORMAL HIGH (ref 70–99)

## 2010-06-27 LAB — PROTIME-INR
INR: 1.3 (ref 0.00–1.49)
INR: 1.6 — ABNORMAL HIGH (ref 0.00–1.49)
INR: 2.4 — ABNORMAL HIGH (ref 0.00–1.49)
INR: 2.5 — ABNORMAL HIGH (ref 0.00–1.49)
INR: 2.6 — ABNORMAL HIGH (ref 0.00–1.49)
INR: 3 — ABNORMAL HIGH (ref 0.00–1.49)
INR: 3.7 — ABNORMAL HIGH (ref 0.00–1.49)
INR: 1.2 (ref 0.00–1.49)
Prothrombin Time: 17 seconds — ABNORMAL HIGH (ref 11.6–15.2)
Prothrombin Time: 20.2 seconds — ABNORMAL HIGH (ref 11.6–15.2)
Prothrombin Time: 27.7 seconds — ABNORMAL HIGH (ref 11.6–15.2)
Prothrombin Time: 28.9 seconds — ABNORMAL HIGH (ref 11.6–15.2)
Prothrombin Time: 33.3 seconds — ABNORMAL HIGH (ref 11.6–15.2)
Prothrombin Time: 39.9 seconds — ABNORMAL HIGH (ref 11.6–15.2)
Prothrombin Time: 15.5 seconds — ABNORMAL HIGH (ref 11.6–15.2)

## 2010-06-27 LAB — SODIUM, URINE, RANDOM: Sodium, Ur: <10 mEq/L

## 2010-06-27 LAB — CBC
HCT: 25.7 % — ABNORMAL LOW (ref 39.0–52.0)
HCT: 27 % — ABNORMAL LOW (ref 39.0–52.0)
HCT: 34 % — ABNORMAL LOW (ref 39.0–52.0)
Hemoglobin: 11.6 g/dL — ABNORMAL LOW (ref 13.0–17.0)
Hemoglobin: 9.2 g/dL — ABNORMAL LOW (ref 13.0–17.0)
MCHC: 34 g/dL (ref 30.0–36.0)
MCV: 98.4 fL (ref 78.0–100.0)
MCV: 99.3 fL (ref 78.0–100.0)
Platelets: 109 10*3/uL — ABNORMAL LOW (ref 150–400)
Platelets: 95 10*3/uL — ABNORMAL LOW (ref 150–400)
Platelets: 155 10*3/uL (ref 150–400)
Platelets: 73 10*3/uL — ABNORMAL LOW (ref 150–400)
RBC: 2.74 MIL/uL — ABNORMAL LOW (ref 4.22–5.81)
RBC: 3.01 MIL/uL — ABNORMAL LOW (ref 4.22–5.81)
RBC: 3.76 MIL/uL — ABNORMAL LOW (ref 4.22–5.81)
RBC: 3.76 MIL/uL — ABNORMAL LOW (ref 4.22–5.81)
RDW: 14.1 % (ref 11.5–15.5)
WBC: 10 10*3/uL (ref 4.0–10.5)
WBC: 11.1 10*3/uL — ABNORMAL HIGH (ref 4.0–10.5)
WBC: 12 10*3/uL — ABNORMAL HIGH (ref 4.0–10.5)
WBC: 7.3 10*3/uL (ref 4.0–10.5)
WBC: 7.8 10*3/uL (ref 4.0–10.5)
WBC: 7.3 10*3/uL (ref 4.0–10.5)

## 2010-06-27 LAB — OSMOLALITY, URINE: Osmolality, Ur: 419 mOsm/kg (ref 390–1090)

## 2010-06-27 LAB — APTT: aPTT: 28 seconds (ref 24–37)

## 2010-06-27 LAB — TYPE AND SCREEN: ABO/RH(D): A POS

## 2010-06-27 LAB — PHOSPHORUS
Phosphorus: 3.1 mg/dL (ref 2.3–4.6)
Phosphorus: 4 mg/dL (ref 2.3–4.6)
Phosphorus: 5.8 mg/dL — ABNORMAL HIGH (ref 2.3–4.6)

## 2010-06-27 LAB — POCT I-STAT 7, (LYTES, BLD GAS, ICA,H+H)
Bicarbonate: 25.7 mEq/L — ABNORMAL HIGH (ref 20.0–24.0)
Calcium, Ion: 1.15 mmol/L (ref 1.12–1.32)
Hemoglobin: 10.2 g/dL — ABNORMAL LOW (ref 13.0–17.0)
TCO2: 27 mmol/L (ref 0–100)
pCO2 arterial: 48.6 mmHg — ABNORMAL HIGH (ref 35.0–45.0)
pH, Arterial: 7.332 — ABNORMAL LOW (ref 7.350–7.450)

## 2010-06-27 LAB — MAGNESIUM
Magnesium: 2.1 mg/dL (ref 1.5–2.5)
Magnesium: 2.1 mg/dL (ref 1.5–2.5)
Magnesium: 1.7 mg/dL (ref 1.5–2.5)

## 2010-06-27 LAB — HEMATOCRIT: HCT: 30.7 % — ABNORMAL LOW (ref 39.0–52.0)

## 2010-06-27 LAB — BRAIN NATRIURETIC PEPTIDE: Pro B Natriuretic peptide (BNP): 118 pg/mL — ABNORMAL HIGH (ref 0.0–100.0)

## 2010-06-27 LAB — ABO/RH: ABO/RH(D): A POS

## 2010-06-27 LAB — PREPARE RBC (CROSSMATCH)

## 2010-07-04 ENCOUNTER — Other Ambulatory Visit: Payer: Self-pay | Admitting: Cardiology

## 2010-07-08 ENCOUNTER — Ambulatory Visit (INDEPENDENT_AMBULATORY_CARE_PROVIDER_SITE_OTHER): Payer: Medicare Other | Admitting: *Deleted

## 2010-07-08 DIAGNOSIS — I4891 Unspecified atrial fibrillation: Secondary | ICD-10-CM

## 2010-07-08 DIAGNOSIS — Z7901 Long term (current) use of anticoagulants: Secondary | ICD-10-CM

## 2010-07-09 ENCOUNTER — Other Ambulatory Visit (INDEPENDENT_AMBULATORY_CARE_PROVIDER_SITE_OTHER): Payer: Medicare Other

## 2010-07-09 DIAGNOSIS — E119 Type 2 diabetes mellitus without complications: Secondary | ICD-10-CM

## 2010-07-09 LAB — HEMOGLOBIN A1C: Hgb A1c MFr Bld: 7.1 % — ABNORMAL HIGH (ref 4.6–6.5)

## 2010-07-26 ENCOUNTER — Other Ambulatory Visit: Payer: Self-pay | Admitting: *Deleted

## 2010-07-27 MED ORDER — SIMVASTATIN 40 MG PO TABS: 40.0000 mg | ORAL_TABLET | Freq: Every day | ORAL | Status: DC

## 2010-07-28 ENCOUNTER — Ambulatory Visit (INDEPENDENT_AMBULATORY_CARE_PROVIDER_SITE_OTHER): Payer: Medicare Other | Admitting: Cardiology

## 2010-07-28 ENCOUNTER — Ambulatory Visit (INDEPENDENT_AMBULATORY_CARE_PROVIDER_SITE_OTHER): Payer: Medicare Other | Admitting: *Deleted

## 2010-07-28 ENCOUNTER — Encounter: Payer: Self-pay | Admitting: Cardiology

## 2010-07-28 DIAGNOSIS — I2510 Atherosclerotic heart disease of native coronary artery without angina pectoris: Secondary | ICD-10-CM

## 2010-07-28 DIAGNOSIS — I4891 Unspecified atrial fibrillation: Secondary | ICD-10-CM

## 2010-07-28 DIAGNOSIS — E8779 Other fluid overload: Secondary | ICD-10-CM

## 2010-07-28 DIAGNOSIS — J90 Pleural effusion, not elsewhere classified: Secondary | ICD-10-CM

## 2010-07-28 DIAGNOSIS — Z7901 Long term (current) use of anticoagulants: Secondary | ICD-10-CM

## 2010-07-28 DIAGNOSIS — I10 Essential (primary) hypertension: Secondary | ICD-10-CM

## 2010-07-28 DIAGNOSIS — Q159 Congenital malformation of eye, unspecified: Secondary | ICD-10-CM | POA: Insufficient documentation

## 2010-07-28 NOTE — Progress Notes (Signed)
HPI Patient is seen to followup hypertension, atrial fibrillation, shortness of breath, pericardial effusion, pleural effusion.  His cardiac symptoms are stable.  His shortness of breath is reasonable at this time.  His blood pressures have been checked twice daily every day since he was last year.  He continues to have pattern of a high blood pressure when he first gets up in the morning.  Later that day his blood sugars down and sometimes low.  He does not have symptoms from this.  I have adjusted his medicines for this.  When we use higher doses at night of amlodipine he had some hypotension.  The patient's ophthalmologist noted that she thought there might be a clot seen in his eye.  He has reported this to me.  She will be doing his cataract surgery with him on Coumadin. Allergies  Allergen Reactions  . Benazepril Hcl     REACTION: cough  . Meperidine Hcl   . Penicillins     REACTION: anaphylaxis    Current Outpatient Prescriptions  Medication Sig Dispense Refill  . ACCU-CHEK COMFORT CURVE test strip USE AS DIRECTED  100 each  4  . ACCU-CHEK SOFTCLIX LANCETS lancets USE AS DIRECTED  100 each  4  . amLODipine (NORVASC) 2.5 MG tablet Take 2.5 mg by mouth daily.        Marland Kitchen aspirin 81 MG tablet Take 81 mg by mouth once a week.        . carvedilol (COREG) 25 MG tablet Take 25 mg by mouth 2 (two) times daily with a meal.        . furosemide (LASIX) 40 MG tablet Take 40 mg by mouth 2 (two) times daily. May take extra tablet if needed.      . gabapentin (NEURONTIN) 100 MG capsule Take 100 mg by mouth 2 (two) times daily.       Marland Kitchen glimepiride (AMARYL) 2 MG tablet 1/2 tablet daily      . losartan (COZAAR) 50 MG tablet Take 50 mg by mouth 2 (two) times daily.        . metFORMIN (GLUCOPHAGE) 500 MG tablet Take 500 mg by mouth daily.        . methocarbamol (ROBAXIN) 500 MG tablet Take 500 mg by mouth 2 (two) times daily.       . nitroGLYCERIN (NITROSTAT) 0.4 MG SL tablet Place 0.4 mg under the tongue  every 5 (five) minutes as needed.        . potassium chloride (KLOR-CON) 20 MEQ packet Take 10 mEq by mouth.       . simvastatin (ZOCOR) 40 MG tablet Take 1 tablet (40 mg total) by mouth at bedtime.  90 tablet  1  . warfarin (COUMADIN) 2.5 MG tablet Take by mouth as directed.        Marland Kitchen DISCONTD: carvedilol (COREG) 25 MG tablet TAKE ONE TABLET BY MOUTH EVERY DAY IN THE MORNING, AND TAKE TWO EVERY DAY IN THE EVENING  270 tablet  3  . DISCONTD: carvedilol (COREG) 25 MG tablet        . DISCONTD: colchicine 0.6 MG tablet Take 0.6 mg by mouth as needed.        Marland Kitchen DISCONTD: indomethacin (INDOCIN) 25 MG capsule Take 25 mg by mouth 3 (three) times daily as needed.          History   Social History  . Marital Status: Married    Spouse Name: N/A    Number of Children: N/A  .  Years of Education: N/A   Occupational History  . Not on file.   Social History Main Topics  . Smoking status: Former Smoker    Quit date: 03/21/1990  . Smokeless tobacco: Former Neurosurgeon  . Alcohol Use: 0.0 oz/week     beer  . Drug Use: No  . Sexually Active: Not on file   Other Topics Concern  . Not on file   Social History Narrative  . No narrative on file    Family History  Problem Relation Age of Onset  . Hypertension Mother   . Stroke Mother   . Diabetes Father   . Coronary artery disease Father     Past Medical History  Diagnosis Date  . Diabetes mellitus   . Hyperlipidemia   . Hypertension   . Pilonidal cyst   . Atrial fibrillation     Previous long-term amiodarone therapy with multiple cardioversions / amiodarone stopped September, 2009  . Atrial flutter     Started November, 2010, Left-sided and cannot ablate  . Left atrial thrombus     Remote past... cardioversions done since that time  . COPD (chronic obstructive pulmonary disease)     PFTs.. relatively good function 2010  . Wide-complex tachycardia   . Left ventricular ejection fraction less than 40%   . Gout   . AAA (abdominal aortic  aneurysm)     Surgical repair  . Discolored skin   . S/P ICD (internal cardiac defibrillator) procedure     Dr. Ladona Ridgel 2009... by the pacing  . SOB (shortness of breath)     Large left effusion/ thoracentesis/hospitalization/November, 2011... exudated.. cytology negative.. Dr.Wert.. no proof of mesothelioma  . Pericardial effusion   . Pleural effusion   . Renal insufficiency   . S/P AAA repair   . Spinal stenosis     Surgery Dr.Elsner  . CAD (coronary artery disease)     Catheterization July, 2008... name and vein grafts patent but low cardiac output  . Warfarin anticoagulation   . Cardiomyopathy     Ischemic... ICD  . CHF (congestive heart failure)     EF 30-40%... echo.. November, 2008  /  EF 55-60% echo... November, 2011  . Venous insufficiency     Toe discoloration chronic  . Mitral regurgitation     Mild echo  . Aortic valve sclerosis   . Nasal drainage     Chronic  . Alcohol ingestion of more than four drinks per week     Excess beer  not a dependency problem  . Chronotropic incompetence     IV pacing rate adjusted  . Thrombophlebitis of superficial veins of upper extremities     Possible venous stenosis from defibrillator  . Pericardial effusion     November, 2011 .. decreased during hospitalization    Past Surgical History  Procedure Date  . Colonoscopy w/ polypectomy   . Abdominal aortic aneurysm repair   . Coronary artery bypass graft   . Icd      insertion  . Lumbar fusion   . Pilonidal cyst removal   . Surgery scrotal / testicular     ROS  Patient denies fever, chills no headache no sweats, rash, change in vision, change in hearing, chest pain, cough, nausea vomiting, urinary symptoms.  He is having more low back pain.  This seems to be different type of discomfort.  All other systems are reviewed and are negative.  PHYSICAL EXAM Patient is oriented to person time and place.  Affect  is normal.  He is here with his wife.  Head is atraumatic.  No  xanthelasma.  No jugular venous extension.  I can hear breath sounds in his left lung base.  No rales are heard.  Cardiac exam reveals S1-S2.  There is a soft systolic murmur.  The abdomen is more protuberant than usual.  There is no peripheral edema. Filed Vitals:   07/28/10 1423  BP: 129/85  Pulse: 83  Height: 6' (1.829 m)  Weight: 249 lb (112.946 kg)    EKG  EKG is done today and reveals a paced rhythm.  ASSESSMENT & PLAN

## 2010-07-28 NOTE — Patient Instructions (Signed)
A chest x-ray takes a picture of the organs and structures inside the chest, including the heart, lungs, and blood vessels. This test can show several things, including, whether the heart is enlarges; whether fluid is building up in the lungs; and whether pacemaker / defibrillator leads are still in place. Your physician recommends that you schedule a follow-up appointment in: 3 months.

## 2010-07-28 NOTE — Assessment & Plan Note (Signed)
It is reported to me that there is question of the clot may have been seen on his eye exam.  I will not be recommending any further workup then very careful attention to his Coumadin dosing.

## 2010-07-28 NOTE — Assessment & Plan Note (Signed)
Coronary disease is stable. No change in therapy. 

## 2010-07-28 NOTE — Assessment & Plan Note (Signed)
Patient continues to have his pattern of higher blood pressure in the morning and lower during the day.  I've chosen not to change his meds any further.

## 2010-07-28 NOTE — Assessment & Plan Note (Signed)
The patient has not had a chest x-ray since his admission for a pleural effusion.  Chest x-ray will be obtained.

## 2010-07-28 NOTE — Assessment & Plan Note (Signed)
Atrial fibrillation rate is stable.  No change in therapy.

## 2010-07-28 NOTE — Assessment & Plan Note (Signed)
Fluid status is stable.  No change in therapy. 

## 2010-07-29 ENCOUNTER — Ambulatory Visit (INDEPENDENT_AMBULATORY_CARE_PROVIDER_SITE_OTHER)
Admission: RE | Admit: 2010-07-29 | Discharge: 2010-07-29 | Disposition: A | Discharge status: Home / Self Care | Payer: Medicare Other | Source: Ambulatory Visit | Attending: Cardiology | Admitting: Cardiology

## 2010-07-29 DIAGNOSIS — J90 Pleural effusion, not elsewhere classified: Secondary | ICD-10-CM

## 2010-08-03 ENCOUNTER — Ambulatory Visit (INDEPENDENT_AMBULATORY_CARE_PROVIDER_SITE_OTHER): Payer: Medicare Other | Admitting: Internal Medicine

## 2010-08-03 ENCOUNTER — Encounter: Payer: Self-pay | Admitting: Internal Medicine

## 2010-08-03 VITALS — BP 120/84 | HR 71 | Ht 72.0 in | Wt 250.0 lb

## 2010-08-03 DIAGNOSIS — Z95810 Presence of automatic (implantable) cardiac defibrillator: Secondary | ICD-10-CM

## 2010-08-03 DIAGNOSIS — I509 Heart failure, unspecified: Secondary | ICD-10-CM

## 2010-08-03 DIAGNOSIS — I2510 Atherosclerotic heart disease of native coronary artery without angina pectoris: Secondary | ICD-10-CM

## 2010-08-03 DIAGNOSIS — I428 Other cardiomyopathies: Secondary | ICD-10-CM

## 2010-08-03 DIAGNOSIS — I5023 Acute on chronic systolic (congestive) heart failure: Secondary | ICD-10-CM

## 2010-08-03 NOTE — Assessment & Plan Note (Signed)
[facility]                            CARDIOLOGY OFFICE NOTE   NAME:Fitterer, GENEVIEVE RITZEL              MRN:          [id number]  DATE:10/19/2006                            DOB:          [birth year]    Office note on Percell Miller.   Mr. Aycock is seen for followup.  See my extensive note of September 28, 2006.   The patient is looking better.  He still has shortness of breath;  however, he is diuresed, and he is feeling better.  Changes that were  made included lowering his Diltiazem dose from b.i.d. to once a day.  TSH was checked and it is as always low normal.  He did have pulmonary  function studies.  He appears to have good pulmonary function and there  is no major effect from his amiodarone.  His breathing is probably in  some ways related to his weight.  He and I talked about this.  Also, he  still is in atrial fibrillation, although we did not check an EKG today.  We were getting close to proceeding with cardioversion but his INR today  is 1.9 and we will take a step back and regroup and follow through on  this.   PAST MEDICAL HISTORY:   ALLERGIES:  PENICILLIN.   MEDICATIONS:  Aspirin, metoprolol, Diltiazem, Vytorin, Coumadin,  metformin, potassium, amiodarone, benazepril, Amaryl, and furosemide.  His higher dose of furosemide clearly is helping him.  He has less edema  and his BUN is 24 and creatinine 1.5.   PAST MEDICAL HISTORY:  Other medical problems, see the very extensive  list on my note of September 28, 2006.  This is all reviewed at great length.  I have reviewed the issues with the patient and his wife at great  length.   REVIEW OF SYSTEMS:  Otherwise, negative.   PHYSICAL EXAMINATION:  VITAL SIGNS:  Blood pressure is 144/90 with a  pulse of 72.  Weight is 239 pounds.  NEUROLOGIC:  The patient is oriented to person, time, and place.  Affect  is normal.  HEENT:  Reveals no xanthelasma.  He has normal extraocular  motion.  SKIN:  Discolored from his amiodarone and sun exposure.  NECK:  There are no carotid bruits.  LUNGS:  Reveal a few scattered rhonchi.  He has no respiratory distress  at rest.  CARDIAC:  Reveals an S1 with an S2.  There are no clicks or significant  murmurs.  ABDOMEN:  Protuberant.  EXTREMITIES:  He has only trivial peripheral edema today.   PROBLEM LIST:  Listed extensively on the note of September 28, 2006, and  reviewed.  These include problems from numbers 1-30.   His increased Lasix dose appears to be good and we will keep him at the  same range.  Pulmonary function studies as mentioned look good and he  needs to loose weight.  I have talked with him about weight loss.  This  is very important.  His Cardizem dose will be kept at once a day.  Nothing will be done about the low normal TSH.  We will make plans to  follow his INR very carefully and once again plan to either cardiovert  him in the last week of August or potentially the second week of  September, after they are back from their vacation.     [name], MD, [facility]  Electronically Signed    JDK/MedQ  DD: 10/19/2006  DT: 10/19/2006  Job #: [id number]

## 2010-08-03 NOTE — Op Note (Signed)
NAME:  [name], [name] NO.:  [id number]   MEDICAL RECORD NO.:  [id number]          PATIENT TYPE:  OIB   LOCATION:  2899                         FACILITY:  [facility]   PHYSICIAN:  Doylene Canning. Ladona Ridgel, MD    DATE OF BIRTH:  [birth year]   DATE OF PROCEDURE:  04/11/2007  DATE OF DISCHARGE:                               OPERATIVE REPORT   PROCEDURE PERFORMED:  Electrophysiologic study.   INTRODUCTION:  The patient is a 70 year old male with an ischemic  cardiomyopathy, congestive heart failure, left bundle branch block and  documented atrial flutter.  He on amiodarone.  He has had a remote  history of A fib.  He is brought in today with all the above for  consideration for catheter ablation of his atrial flutter, followed by  implantation of biventricular ICD in the setting of ischemic  cardiomyopathy, EF 35%, class 3 heart failure, left bundle branch block.   PROCEDURE:  After informed obtained, the patient was taken diagnostic EP  lab in fasting state.  After usual preparation and draping, intravenous  fentanyl and Midazolam was given for sedation.  A 6-French hexapolar  catheter was inserted percutaneously in the right jugular vein and  advanced into the coronary sinus.  It should be noted that advancing the  catheter into the coronary sinus proper was made quite difficult.  In  fact the catheter could only be advanced into the coronary sinus os.  A  7-French 20 pole halo catheter was served percutaneously into the right  femoral vein and advanced to the right atrium.  A 5-French quadripolar  catheter was inserted percutaneously in the right femoral vein and  advanced to His bundle region.  After measurement of the basic  intervals, rapid atrial pacing was carried out from the coronary sinus  demonstrating an AV Wenckebach cycle length of 700 milliseconds.  During  rapid atrial pacing the PR interval was less than the RR interval.  Next  programmed atrial stimulation was  carried out also demonstrating AV  Wenckebach at 700 milliseconds.  Rapid ventricular pacing demonstrated  VA dissociation at 600 milliseconds.  At this point additional rapid  atrial pacing was carried out at cycle length of 290 milliseconds and  resulting in the initiation of atrial flutter.  It should be noted that  the activation sequence appeared clockwise.  The activation in the left  atrium could not be ascertained because the ablation catheter could not  be advanced past the CS os.  At this point a 7-French quadripolar  ablation catheter was advanced into the right femoral vein and up into  the usual atrial flutter isthmus.  Entrainment mapping was carried out  because of concerns that in fact this may not be isthmus flutter.  Pacing from a variety of sites in the atrial flutter isthmus between 7  and 5 o'clock resulted long post pacing intervals consistent with a non  atrial flutter isthmus dependent origin.  The ablation catheter was  maneuvered up into the high right atrium also demonstrated long post  pacing intervals.  This was also demonstrated on the lateral portion the  right atrium.  With these mapping data in hand, it was deduced that the  flutter was in fact coming from the left atrium.  Interestingly enough,  the ablation catheter was advanced into the coronary sinus and  additional pacing carried out from this location also demonstrating that  the coronary sinus proper was not part of the atrial flutter circuit.  Additional rapid atrial pacing was then carried out and attempts to try  to pace terminate the patient's atrial flutter, resulting in the  induction of two additional atrial flutters with different atrial  activations.  In each of these, pacing the atrial flutter isthmus was  carried out in hopes that one might have been coming from this location,  however at no time was there any evidence of entrainment mapping  criteria demonstrating typical atrial flutter  coming from the right  atrium.  At this point the patient was prepped for ICD implantation.  The plan will be to cardiovert the patient back to sinus rhythm during  defibrillation threshold testing.   COMPLICATIONS:  There were no immediate procedure complications.   RESULTS:  a.  Baseline ECG.  The baseline ECG demonstrates sinus rhythm  with left bundle branch block.  b.  Baseline intervals.  Sinus node cycle length of 987 milliseconds.  QRS duration was 150 milliseconds.  The HV interval was 85 milliseconds.  c.  Rapid atrial pacing.  Rapid atrial pacing demonstrated AV Wenckebach  at 700 milliseconds.  Additional pacing cycle length down to 290  milliseconds resulted in the initiation of atrial flutter.  c.  Programmed atrial stimulation.  Programmed atrial stimulation was  carried out at base drive cycle length of 161 milliseconds demonstrating  AV dissociation.  d.  Rapid ventricular pacing.  Rapid ventricular pacing was carried out  utilizing the ablation catheter from the RV apex demonstrating VA  dissociation.  e.  Programmed ventricular stimulation.  Programmed ventricular  stimulation was carried out and the ablation catheter demonstrating VA  dissociation.  f.  Arrhythmias observed.  1. Atrial flutter initiation rapid atrial pacing, duration was      sustained, termination was not possible with pacing.  2. Atrial flutter initiation rapid atrial pacing, duration sustained,      cycle length 280 milliseconds, method of initiation was rapid      atrial pacing.  3. Atrial flutter initiation rapid atrial pacing, duration sustained,      cycle length was 300 milliseconds.      a.     Mapping.  Mapping of the multiple atrial flutters       demonstrated that the right atrium was extremely large and the       coronary sinus ostium was posteriorly displaced.   CONCLUSION:  This study demonstrates multiple inducible atrial flutters  all originating from the left atrium as  demonstrated by __________  interval being equal to or nearly equal to the atrial flutter cycle  length when pacing was carried out from the right atrium.  Also the  patient has very severe AV conduction system disease as manifested by an  HV interval of 85 milliseconds and by AV Wenckebach at 700 milliseconds  during rapid atrial pacing.  He will be referred for bi-V ICD insertion  which will be dictated on separate dictation.      Doylene Canning. Ladona Ridgel, MD  Electronically Signed     GWT/MEDQ  D:  04/11/2007  T:  04/11/2007  Job:  [id number]   cc:   Duane Lope.  [name], MD, [facility]

## 2010-08-03 NOTE — Assessment & Plan Note (Signed)
His device has reached ERI. We'll schedule ICD generator change in the next several weeks.

## 2010-08-03 NOTE — Progress Notes (Signed)
HPI [name] returns today for followup. He is a pleasant 70 year old man with a long-standing ischemic cardiomyopathy, chronic congestive heart failure class 2-3, and chronic atrial fibrillation. He is status post bi-V. ICD implantation. The patient denies chest pain. He has chronic class II to class III congestive heart failure symptoms. The patient has had no syncope. He has chronic peripheral edema. He has reached elective replacement indication on his ICD. Allergies  Allergen Reactions  . Benazepril Hcl     REACTION: cough  . Meperidine Hcl   . Penicillins     REACTION: anaphylaxis     Current Outpatient Prescriptions  Medication Sig Dispense Refill  . ACCU-CHEK COMFORT CURVE test strip USE AS DIRECTED  100 each  4  . ACCU-CHEK SOFTCLIX LANCETS lancets USE AS DIRECTED  100 each  4  . amLODipine (NORVASC) 2.5 MG tablet Take 2.5 mg by mouth daily.        Marland Kitchen aspirin 81 MG tablet Take 81 mg by mouth once a week.        . carvedilol (COREG) 25 MG tablet Take 25 mg by mouth 2 (two) times daily with a meal.        . furosemide (LASIX) 40 MG tablet Take 40 mg by mouth 2 (two) times daily. May take extra tablet if needed.      . gabapentin (NEURONTIN) 100 MG capsule Take 100 mg by mouth 2 (two) times daily.       Marland Kitchen glimepiride (AMARYL) 2 MG tablet 1/2 tablet daily      . losartan (COZAAR) 50 MG tablet Take 50 mg by mouth 2 (two) times daily.        . metFORMIN (GLUCOPHAGE) 500 MG tablet Take 500 mg by mouth daily.        . methocarbamol (ROBAXIN) 500 MG tablet Take 500 mg by mouth 2 (two) times daily.       . nitroGLYCERIN (NITROSTAT) 0.4 MG SL tablet Place 0.4 mg under the tongue every 5 (five) minutes as needed.        . potassium chloride (KLOR-CON) 20 MEQ packet Take 10 mEq by mouth daily.       . simvastatin (ZOCOR) 40 MG tablet Take 1 tablet (40 mg total) by mouth at bedtime.  90 tablet  1  . warfarin (COUMADIN) 2.5 MG tablet Take by mouth as directed.           Past Medical  History  Diagnosis Date  . Diabetes mellitus   . Hyperlipidemia   . Hypertension   . Pilonidal cyst   . Atrial fibrillation     Previous long-term amiodarone therapy with multiple cardioversions / amiodarone stopped September, 2009  . Atrial flutter     Started November, 2010, Left-sided and cannot ablate  . Left atrial thrombus     Remote past... cardioversions done since that time  . COPD (chronic obstructive pulmonary disease)     PFTs.. relatively good function 2010  . Wide-complex tachycardia   . Left ventricular ejection fraction less than 40%   . Gout   . AAA (abdominal aortic aneurysm)     Surgical repair  . Discolored skin   . S/P ICD (internal cardiac defibrillator) procedure     Dr. Ladona Ridgel 2009... by the pacing  . SOB (shortness of breath)     Large left effusion/ thoracentesis/hospitalization/November, 2011... exudated.. cytology negative.. Dr.Wert.. no proof of mesothelioma  . Pericardial effusion   . Pleural effusion   . Renal insufficiency   .  S/P AAA repair   . Spinal stenosis     Surgery Dr.Elsner  . CAD (coronary artery disease)     Catheterization July, 2008... name and vein grafts patent but low cardiac output  . Warfarin anticoagulation   . Cardiomyopathy     Ischemic... ICD  . CHF (congestive heart failure)     EF 30-40%... echo.. November, 2008  /  EF 55-60% echo... November, 2011  . Venous insufficiency     Toe discoloration chronic  . Mitral regurgitation     Mild echo  . Aortic valve sclerosis   . Nasal drainage     Chronic  . Alcohol ingestion of more than four drinks per week     Excess beer  not a dependency problem  . Chronotropic incompetence     IV pacing rate adjusted  . Thrombophlebitis of superficial veins of upper extremities     Possible venous stenosis from defibrillator  . Pericardial effusion     November, 2011 .. decreased during hospitalization  . Eye abnormality     Ophthalmologist questions a clot in one of his eyes,  May, 2012    ROS:   All systems reviewed and negative except as noted in the HPI.   Past Surgical History  Procedure Date  . Colonoscopy w/ polypectomy   . Abdominal aortic aneurysm repair   . Coronary artery bypass graft   . Icd      insertion  . Lumbar fusion   . Pilonidal cyst removal   . Surgery scrotal / testicular      Family History  Problem Relation Age of Onset  . Hypertension Mother   . Stroke Mother   . Diabetes Father   . Coronary artery disease Father      History   Social History  . Marital Status: Married    Spouse Name: N/A    Number of Children: N/A  . Years of Education: N/A   Occupational History  . Not on file.   Social History Main Topics  . Smoking status: Former Smoker    Quit date: 03/21/1990  . Smokeless tobacco: Former Neurosurgeon  . Alcohol Use: 0.0 oz/week     beer  . Drug Use: No  . Sexually Active: Not on file   Other Topics Concern  . Not on file   Social History Narrative  . No narrative on file     BP 120/84  Pulse 71  Ht 6' (1.829 m)  Wt 250 lb (113.399 kg)  BMI 33.91 kg/m2  Physical Exam:  Well appearing NAD HEENT: Unremarkable Neck:  No JVD, no thyromegally Lymphatics:  No adenopathy Back:  No CVA tenderness Lungs:  Clear. Well-healed ICD incision HEART:  Regular rate rhythm, no murmurs, no rubs, no clicks Abd:  Flat, positive bowel sounds, no organomegally, no rebound, no guarding Ext:  2 plus pulses, no edema, no cyanosis, no clubbing Skin:  No rashes no nodules Neuro:  CN II through XII intact, motor grossly intact  DEVICE  Normal device function.  See PaceArt for details.   Assess/Plan:

## 2010-08-03 NOTE — Assessment & Plan Note (Signed)
[name]                            CARDIOLOGY OFFICE NOTE   NAME:[name], [name]              MRN:          [id number]  DATE:09/14/2006                            DOB:          Jan 14, 1941    [name] is seen for followup.  See my extensive note of August 23, 2006.  This includes an addendum that was dictated a few minutes later.  At that time I was concerned about his shortness of breath.  I arranged  for a followup 2-D echo and a Myoview scan.  His echo shows that his  ejection fraction now is being read in the 30% to 40% range.  In the  past, his ejection fraction had improved.  His last echo dated 2004  showed normal LV function.  As mentioned above, his current echo is read  as 30% to 40%.  In addition, his Myoview scan suggested an ejection  fraction of 36%.  There is decreased activity in the anteroseptal wall  at the apex.  There is reversibility and this raises the question of  ischemia.  Considering all of these findings, we need more information  to further assess his current shortness of breath and this change in his  LV function.  I talked with the patient and his wife today and we will  arrange for an outpatient right and left heart cath.  Consideration also  can be given at that time to whether or not any peripheral angiography  would be appropriate.   PAST MEDICAL HISTORY:   ALLERGIES:  PENICILLIN   MEDICATIONS:  1. Aspirin 81 one time per week.  2. Metoprolol 25 b.i.d.  3. Diltiazem 180 b.i.d.  4. Vytorin 10/20.  5. Coumadin.  6. Furosemide.  7. Metformin.  8. Potassium .  9. Amiodarone 400 mg daily.  10.Benazepril 40.  11.Amaryl 1 mg b.i.d.   OTHER MEDICAL PROBLEMS:  See the extensive list on my note of August 23, 2006.   REVIEW OF SYSTEMS:  He still has peripheral edema that comes up during  the day and goes down at night.   PHYSICAL EXAMINATION:  Blood pressure today is 130/70.  His pulse is 60.  Weight  is 238 pounds.  The patient is oriented to person, time and place.  Affect is normal.  He is here with his wife today.  His skin reveals the grayish tint from amiodarone therapy and sunshine.  Over time I have always encouraged him to try to stay out of the sun.  HEENT:  Reveals no xanthelasma.  He has normal extraocular motion.  There are no carotid bruits, there is no jugular venous distention.  LUNGS:  Clear. Respiratory effort is not labored.  CARDIAC:  Reveals an S1 with an S2.  The rhythm is irregularly  irregular.  ABDOMEN:  Protuberant.  He has trace peripheral edema at this time.   PROBLEMS:  Are listed numbers 1 thru 27 on my note of August 23, 2006.  Problem #5 - atrial fibrillation.  The patient had been in sinus when I  saw him on June 4.  However I know from his  nuclear scan that he went  back into atrial fib and by physical exam today he remains in atrial fib  or possibly slow atrial flutter.  We may try to cardiovert him at some  point.  Problem #7 - shortness of breath.  We did obtain a chest x-ray.  The  result is not in front of me at this time, but it is my recollection  that there was no diagnostic abnormality.  The nuclear findings are  discussed above.  The echo findings are discussed above.  I am concerned  that the patient may have return of ischemic heart disease as the basis  of his shortness of breath.  On this basis we will arrange for cardiac  catheterization with both right and left heart cath.  Problem #8 -  - some claudication.  He needs a peripheral vascular  evaluation at some point.  Number #9 - post coronary artery bypass graft in 2004.  Grafts will need  to be done at the time of his cath.  Problem #14 - ejection fraction that had been abnormal and then  normalized is now abnormal again.  I do not know if this is due to new  ischemic disease or other factors such as his rhythm.  Problem #25 - edema.  He is on Lasix and his overall total volume  status  I believe is stable.  Problem #26 - excess beer consumption.  He and I have talked about this  and I know that he has cut back.  Problem #27 - renal status.  When his BMET was rechecked recently, his  creatinine had improved dramatically.  I do not know if chronotropic  incompetence could be playing any role in his symptoms.  We will proceed  with right and left heart catheterization and grafts.     [name], MD, [facility]  Electronically Signed    JDK/MedQ  DD: 09/14/2006  DT: 09/14/2006  Job #: [id number]

## 2010-08-03 NOTE — Op Note (Signed)
NAME:  [name], [name] NO.:  [id number]   MEDICAL RECORD NO.:  [id number]          PATIENT TYPE:  INP   LOCATION:  3114                         FACILITY:  [facility]   PHYSICIAN:  Stefani Dama, M.D.  DATE OF BIRTH:  1940-06-22   DATE OF PROCEDURE:  10/02/2008  DATE OF DISCHARGE:                               OPERATIVE REPORT   PREOPERATIVE DIAGNOSES:  Lumbar spondylosis and stenosis, L2-3, L3-4, L4-  5, and L5-S1, status post decompression with X-STOP device, L3-4 and L4-  5 two years ago.   POSTOPERATIVE DIAGNOSES:  Lumbar spondylosis and stenosis, L2-3, L3-4,  L4-5, and L5-S1, status post decompression with X-STOP device, L3-4 and  L4-5 two years ago.   OPERATION:  Laminectomy of L2, L3, L4, and L5; decompression of L2, L3,  L4, L5, and S1 nerve roots bilaterally; posterior lumbar interbody  arthrodesis with PEEK spacer and allograft at L2-3, L3-4, L4-5, and L5-  S1; segmental fixation, L2, L3, L4, L5, and the sacrum with pedicle  screws and posterior rod technique; posterolateral arthrodesis with  local autograft and Infuse, L2 to the sacrum.   SURGEON:  Stefani Dama, MD   FIRST ASSISTANT:  Coletta Memos, MD   SECOND ASSISTANT:  Aura Fey. Dennison Bulla, [name]   INDICATIONS:  [name] is a 70 year old individual who has  had significant back and bilateral lower extremity pain and suffering  with this for a number of years and underwent an X-STOP procedure about  two and half to 3 years ago.  He got some temporary relief for a brief  period of time but his spondylitic and stenotic symptoms have returned  with neurogenic claudication has been quite severe with walking even  short distances less than 50 feet.  He is advised regarding the need for  major decompression arthrodesis which was undertaken with some  hesitation as the patient has underlying cardiovascular disease with  chronic atrial fibrillation and chronic anticoagulation.  He also  recently had a  pacemaker and percutaneous automated defibrillator  placed.  After careful consideration of his deteriorating function in  the hope for better outcome regarding the pain control and inability to  ambulate distances, he opted for the surgery.   PROCEDURE:  The patient was brought to the operating room supine on the  stretcher.  After smooth induction of general endotracheal anesthesia  and placement of appropriate arterial and central venous monitoring  lines and a Foley catheter, he was turned prone.  The back was prepped  with alcohol and DuraPrep and draped in sterile fashion.  A midline  incision was created through the previous incision and extended cephalad  and caudad.  The dissection was carried down to lumbodorsal fascia which  was opened on either side of midline.  When we attempted to identify the  PEEK spacers, we noted that there was substantial bony overgrowth over  the PEEK spacer L3-4 and L4-5.  This required splitting the bone off  with an osteotome and identifying the underlying PEEK spacer.  At L3-4,  there was noted to be an arthrodesis already performed, but this was  an  area where he had significant central and lateral recess canal stenosis.  Therefore once the PEEK spacers could be removed, the set screws were  removed, the posterior taggings were removed and then X-STOP was removed  at L3-4 and then at L4-5, laminectomy was then performed by removing the  laminar arch of L3, L4, and L5 in their entirety out to the mesial wall  of the facet at L2-3.  Radical laminotomy with resection of the entirety  of the inferior articular process of the facet was removed out to the  pars.  The posterior portion of the spinous process was allowed to  remain intact.  The lateral dura was then uncovered and the yellow  ligament in this area was taken up to expose the common dural tube and  the takeoff the L2 nerve root superiorly, the L3 nerve root inferiorly,  and then sequentially  down one side and the other, we did decompressions  involving the L2, the L3, the L4, the L5, and the sacral nerve roots.  The dissection was carried out laterally to the foramen and the foramen  were amply decompress using this laminotomy and foraminotomy technique.  Then, the disk spaces were carefully identified, sequestered away from  the root, and a diskectomy was first performed at L4-L5 using  combination of curettes and rongeurs and a series of dilators to expand  the disk space and perform a total and complete diskectomy.  This also  provided decompression for the central canal and lateral recesses.  Once  total diskectomy was performed, the interbody spacers were sized and it  was felt that a 11-mm spacer would fit best in this interval.  This was  then filled with allograft and Infuse and placed into the interspace and  countersunk appropriately.  Two 11-mm spacers were placed here.  At L5-  S1, a similar procedure was carried out.  However, here the disk space  was severely narrowed and an 8-mm PEEK spacer could be placed again  filled with allograft and Infuse.  At L3-L4, the space was somewhat more  ample and a 10-mm spacer was placed here again filled with allograft and  finally at L2-L3, total diskectomy was performed.  Again, the endplates  were shaved and smooth and the interbody spacer allowed placement of a  10-mm PEEK spacer.  Once all the spacers were placed and the lateral  gutters were then decompressed to allow placement of posterolateral bone  graft and using fluoroscopic guidance, pedicle screws were placed in L2,  L3, L4, L5, and the sacrum.  All the screws were 6.5 x 45-mm screws from  L2 to L5.  The sacral screws were 6.5 x 50-mm sacral screws of Alphatec  variety.  The 120-mm straight rods were then contoured to fit in the  screw heads and these were then placed in the screw heads and the  tightened sequentially.  A transverse connector was applied also.   The  posterolateral gutters were then filled with autologous bone graft and  Infuse and once this packing in the lateral gutters was completed, the  wound was checked for hemostasis.  The common dural tube and the nerve  roots were all checked for their patency in the foramen.  Once this was  secured, the wound was irrigated copiously with antibiotic irrigating  solution.  The lumbodorsal fascia was closed #1 Vicryl in interrupted  fashion, 2-0 Vicryl was used in subcutaneous tissues, 3-0 Vicryl  subcuticularly, and surgical staples  in the skin.  Blood loss was  estimated 2000 mL and 600 mL was returned to the patient.      Stefani Dama, M.D.  Electronically Signed     HJE/MEDQ  D:  10/02/2008  T:  10/03/2008  Job:  [id number]

## 2010-08-03 NOTE — Op Note (Signed)
NAME:  [name], [name] NO.:  [id number]   MEDICAL RECORD NO.:  [id number]          PATIENT TYPE:  OIB   LOCATION:  2807                         FACILITY:  [facility]   PHYSICIAN:  Doylene Canning. Ladona Ridgel, MD    DATE OF BIRTH:  [birth year]   DATE OF PROCEDURE:  04/11/2007  DATE OF DISCHARGE:                               OPERATIVE REPORT   PROCEDURE PERFORMED:  Implantation of a biventricular implantable  cardioverter-defibrillator.   INTRODUCTION:  The patient is a 70 year old man with a history of  ischemic cardiomyopathy and EF of 35%.  He has a large anterolateral  myocardial infarction in the past.  He has class III heart failure and  left bundle branch block and is now referred for bi-V ICD insertion.  Of  note, the patient underwent a electrophysiologic and cath and was found  to have multiple left atrial flutters.   PROCEDURE:  After informed obtained, the patient taken diagnostic EP lab  in fasting state.  After usual preparation, draping, intravenous  fentanyl and Midazolam was given for sedation.  30 mL lidocaine was  infiltrated the left infraclavicular region.  A 7 cm incision was  carried out over this region.  Electrocautery utilized to dissect down  to the fascial plane.  The right subclavian vein was then punctured x3  and the Medtronic model 6935 58-cm active fixation defibrillation lead  serial number [id number] was advanced into the right ventricle and the  Medtronic model 5076 45-cm active fixation pacing lead serial number [id number]  [id number] was advanced to right atrium.  The RV lead was mapped and the RV  septum had low R-waves.  The lead was also placed in the RV apical  septum near the diaphragmatic wall where the R-waves measured 11 mV with  the lead actively fixed.  The impedance was 708 ohms and threshold 0.7  volts at 0.5 milliseconds.  10 volts pacing did not stimulate the  diaphragm.  With the ventricular lead in satisfactory position,  attention was  then turned placement of the atrial lead which was placed  in the anterolateral wall of the right atrium where P-waves (flutter  waves) measured between 1.5 and 2 mV.  The pacing impedance with the  lead actively fixed with 744 ohms.  10 V pacing did not stimulate  diaphragm in the atrium.  With the atrial and the RV lead in  satisfactory position, attention then turned placement of left  ventricular lead.  It should be noted that the right atrium was very  large and the coronary sinus catheter was very posteriorly displaced.  After a very difficult  period of cannulation of the coronary sinus os.  This was finally carried out successfully and venography of the coronary  sinus carried out.  This demonstrated a posterior vein and several very  small lateral veins which were unacceptable for LV lead placement.  There was also an anterior vein with a lateral branch also too small for  LV lead placement.  Initially the posterior vein was cannulated and the  Medtronic bipolar pacing lead was advanced into the vein.  Unfortunately  bipolar pacing at multiple sites demonstrated no satisfactory capture  thresholds.  At this point the Attain subselective catheter was inserted  and additional mapping of the lateral veins carried out to make sure  there was not one that might be acceptable for a unipolar lead.  However  there were none.  The posterior vein was recannulated and a Medtronic  unipolar lead was advanced.  This was a model 4193 88-cm passive  fixation lead serial number [id number].  At a site approximately 3/4  from base to apex on the posterolateral wall of the left ventricle there  was satisfactory pacing thresholds typically around 3 volts at 0.8  milliseconds.  There is no diaphragmatic stimulation noted.  With this  satisfactory location the leads were secured to subpectoralis fascia  with figure-of-eight silk suture and the sewing sleeves were also  secured with silk suture.  The  pocket was irrigated with kanamycin.  Electrocautery was utilized to create a new pocket.  Additional  kanamycin was utilized to irrigate the pocket.  Electrocautery was  utilized to assure hemostasis.  The Medtronic Concerto biventricular ICD  serial number [id number] was connected to the atrial RV and  defibrillation leads and placed back in the subcutaneous pocket.  Generator secured with silk suture.  At this point defibrillation  threshold testing was carried out.   After the patient was more deeply sedated with fentanyl and Versed, VF  was induced with T-wave shock.  A 15 joules shock was delivered which  terminated VF and restored sinus rhythm.  At this point no additional  defibrillation threshold testing was carried out and the incision closed  with layer of 2-0 Vicryl followed by layer of 3-0 Vicryl.  Benzoin  painted on the skin Steri-Strips were applied and pressures was placed.  The patient was returned to his room in satisfactory condition.   COMPLICATIONS:  There were no immediate procedure complications.   RESULTS:  This demonstrates successful implantation of a Medtronic  biventricular ICD in a patient with an ischemic cardiomyopathy and  congestive heart failure and left bundle branch block.      Doylene Canning. Ladona Ridgel, MD  Electronically Signed     GWT/MEDQ  D:  04/11/2007  T:  04/11/2007  Job:  [id number]   cc:   [name], MD, [facility]

## 2010-08-03 NOTE — Assessment & Plan Note (Signed)
[facility]                            CARDIOLOGY OFFICE NOTE   NAME:[name], [name]              MRN:          [id number]  DATE:09/28/2006                            DOB:          1940/04/28    [name] is seen for followup.  His wife called today stating that he  was having ongoing problems with shortness of breath.  He underwent a  cardiac catheterization by Dr. Excell Seltzer on September 26, 2006.  The specifics  are discussed below but there was no major change in his grafts.  He had  mild increase in his left ventricular filling pressure, and he does have  a cardiac output that is on the low side.  He is not having PND or  orthopnea.  However, he has shortness of breath with minimal exertion.  Today in the office he is not uncomfortable.  His major complaint is his  shortness of breath.   PAST MEDICAL HISTORY:   ALLERGIES:  PENICILLIN.   MEDICATIONS:  1. Aspirin 81 mg once weekly.  2. Metoprolol 25 b.i.d.  3. Diltiazem extended release 180 mg b.i.d.  4. Vytorin 10/20.  5. Coumadin as directed.  6. Furosemide 40 daily.  7. Metformin.  8. Potassium.  9. Amiodarone 400 mg daily.  10.Benazepril 40.  11.Amaryl.   OTHER MEDICAL PROBLEMS:  See the extensive list below.   REVIEW OF SYSTEMS:  Per the HPI.   PHYSICAL EXAMINATION:  Weight today is 241 pounds, which is increased 3  pounds since his prior visit.  Blood pressure is 160/86.  His pulse rate  appears to be in the 70 range.  The patient is oriented to person, time, and place.  Affect is normal.  He is here with his wife today.  His skin has the grayish-dark changes of amiodarone plus the sun.  HEENT:  Reveals no xanthelasma.  He has normal extraocular motion.  There are no carotid bruits.  There is no jugular venous distension.  LUNGS:  Clear.  Respiratory effort at rest is unlabored.  CARDIAC EXAM:  Reveals an S1 with an S2.  There is a soft systolic  murmur.  The abdomen is  protuberant.  It may be slightly more tense than usual.  He does have 1+ edema in his left leg and 2+ in his right leg, and has  chronic swelling in the right leg due to his injury of this leg.   EKG reveals his rhythm which is either slow atrial fibrillation or a  slow flutter rhythm.  He has left bundle branch block.   PROBLEM LIST:  1. Allergy to PENICILLIN.  2. Hypertension.  His blood pressure for him is under reasonable      control today.  3. History of a pilonidal cyst in the past.  4. Low back pain which is improved.  He had procedures done by Dr.      Danielle Dess and he is much better.  5. Atrial fibrillation, question atrial flutter.  In the past we have      been able to keep him in sinus with a high dose of amio.  He had  gone in and out of it recently, and most recently he remains in      atrial fib or flutter.  I will continue the high dose amiodarone.      I think that his rhythm may be playing a role in some of his      symptoms and we will arrange for the possibility of a cardioversion      after a few more weeks of following his Coumadin very carefully.      The patient did have a history of a left atrial clot.  However,      this was followed and he had 1 cardioversion done by TEE      cardioversion successfully.  After that time there was another      cardioversion done without the need for TEE, and we can do it      without a TEE this time as long as the Coumadin is carefully      watched.  6. History in the remote past of an atrial clot, see the discussion      above.  7. History of significant claudication.  The patient had leg Dopplers      on June 26, 2006.  There is aneurysmal dilatation of the distal      anastomosis of the aorta femoral bypass graft.  There is irregular      plaque throughout the superficial femoral artery and popliteal      arteries, and there are 2 cm and 3 cm segments of stenosis in the      mid thigh with moderate-to-severe plaque  throughout the lower      extremities.  There is stenosis of the mid left superficial femoral      at 50%, ankle-brachial indexes are normal.  The left posterior      tibial artery is noncompressive open system with medial      calcification in the right posterior tibial artery and peroneal      arteries are inaudible.  The patient needs peripheral evaluation.      First we will have to find a way to improve his breathing.  8. Coronary artery disease.  Catheterization was done on September 26, 2006.      The full report is available through Dr. Earmon Phoenix note.  He has      native 3-vessel disease.  His vein grafts and left internal mammary      artery are patent.  He did have increased left ventricular filling      pressure that was mild and low cardiac output.  9. Increased filling pressures.  He may be mildly volume overloaded at      this time.  When we have pushed his diuretics in the past, he has      had worsening renal function.  We may have to settle for increased      BUN and creatinine.  We will increase his Lasix carefully with      early followup of his BMET.  10.Low cardiac output while in the cath lab.  This may well be his      problem.  Cardiac index is 1.65.  Cardiac output is 3.8.  Exact      etiology is not clear.  He does have decreased left ventricular      function.  I do not know if his rhythm is playing a role or other      parameters.  We will have to find  ways to try and increase his      cardiac output if possible.  11.Status post wedge resection of the left upper lung.  It was benign      in the past.  12.Chronic obstructive pulmonary disease on his chest x-ray done      recently.  This may also play a role.  He does not appear to have      chest x-ray changes compatible with amiodarone toxicity.  We will      obtain full PFTs and then obtain either pulmonary consultation or      do a full cardiopulmonary exercise test after we have done some of      the other  measures mentioned above.  13.Some Y-complex tachycardia in the past.  14.Coumadin therapy.  15.Ejection fraction.  His ejection fraction had been abnormal and      then normalized over time, but then changed more recently to the      30% to 40% range.  I will reassess this over time but not at this      point.  We did not do an LV gram in the cath lab.  The patient is      on benazepril.  He is on metoprolol.  He is also on Diltiazem which      could be effecting his left ventricular function.  We will decrease      his Cardizem dose, both to see if it helps with left ventricular      function, and to see if there is any sign of chronotropic      incompetence with treatment for his rhythm.  16.History of gout.  17.Chronic discoloration of his toes which we have always felt is      venous.  18.Mild mitral regurgitation.  At the time of his last echo this had      not changed significantly.  19.Mild thickening of the aortic valve, but no significant aortic      stenosis.  20.Thickening of the right ventricular wall with right ventricular      hypertrophy, but good right ventricular function at the prior echo      with dilatation of the right atrium.  21.Status post abdominal aortic aneurysm repair by Dr. Liliane Bade in      the past.  22.History of an infected leg after his bypass that was treated.  23.History of chronic nasal drainage.  24.Status post significant injury to his right leg in the past that      was treated.  25.Easy bruisability, and he is on Coumadin and very low-dose aspirin.  26.TSH in the past that was on the low side.  We had rechecked this      over time, but we will check it again.  27.History of excess beer consumption in the past.  He has cut this      back.  28.Renal insufficiency, creatinine ranges from 1.2 to 1.7.  As we push      his diuresis this will be followed carefully, but we may have to      leave it on the higher side.  29.History of coronary  artery bypass grafting in 2004.  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   PLAN:  Proceed with careful attention to his Coumadin and plans for  cardioversion after several weeks.  We will push his Lasix dose up to 80  mg daily with careful followup of his volume status and his BMET.  We  will obtain full pulmonary function studies, including diffusion  capacity.  Cardizem will be decreased from 180 b.i.d. to 180 daily.  TSH  will be rechecked.  His amiodarone dose will be kept the same.  I will  then see him back for followup.     [name], MD, [facility]  Electronically Signed    JDK/MedQ  DD: 09/28/2006  DT: 09/29/2006  Job #: [id number]

## 2010-08-03 NOTE — Assessment & Plan Note (Signed)
[facility]                            CARDIOLOGY OFFICE NOTE   NAME:Cockrell, MERWYN HODAPP              MRN:          [id number]  DATE:12/04/2007                            DOB:          [birth year]    Mr. Deguire is seen for followup.  I saw him last on September 17, 2007.  He  is feeling relatively well today.  He still has some shortness of  breath, but he is somewhat improved.  Most recently over the last  several months, the major change was that it was found that he was  chronotropic incompetent.  He had chronotropic incompetence.  His rate  on his BiV ICD was adjusted upward and he felt better and he was doing  well in June 2009.  The patient tells me that when his ICD was  interrogated after that that he was in atrial fib the majority of the  time.  He feels relatively well, but not as good as he would like.  We  know that he has several flutter sites that are not easily amenable to  ablation.  He is on high-dose amiodarone.  The issue now will be how to  proceed from here.  We will need to decide if we should cardiovert him  again.  I am not sure that this will necessarily help for the long term.  I will need further input from Dr. Ladona Ridgel concerning these issues.   PAST MEDICAL HISTORY:   ALLERGIES:  PENICILLIN.   MEDICATIONS:  1. Benazepril 40.  2. Vytorin.  3. Coumadin.  4. Aspirin.  5. Carvedilol 6.25 b.i.d.  6. Amiodarone 200 b.i.d.  7. Furosemide 40 mg once a day 5 days a week and 80 mg 2 days a week.  8. Metformin.  9. Glimepiride.  10.Potassium.   OTHER MEDICAL PROBLEMS:  See the extensive list on the note of February 28, 2007 with problems #1 through 30.  In addition, see the note of September 17, 2007 updating.  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  It is being treated by Dr.  Ladona Ridgel.  #31.  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 Madilyn Fireman.  This does not need any further  therapy.   With all of these problem lists, the key issue as of this time is his  atrial fib/flutter and his shortness of breath and he is chronotropic  incompetent and return of atrial fib or flutter by interrogation on his  ICD.  I will not be changing any of his medications at this time.  He is  scheduled to see Dr. Ladona Ridgel soon.  The question will be  1. Should his amiodarone be stopped leaving him in atrial fib or      flutter.  2. Should we try to cardiovert him again.  3. Is he a candidate for other procedures.   I will look forward to be back from Dr. Ladona Ridgel.  We have thought that  his pulmonary function studies were relatively stable overtime.  If we  feel that we are not getting definite benefit from amiodarone, we may  want to consider  stopping it.  He has been on high dose for a long time.     [name], MD, [facility]  Electronically Signed    JDK/MedQ  DD: 12/04/2007  DT: 12/04/2007  Job #: [id number]

## 2010-08-03 NOTE — Procedures (Signed)
DUPLEX DEEP VENOUS EXAM - UPPER EXTREMITY   INDICATION:  Right hand and arm swelling.   HISTORY:  Edema:  Right arm.  Trauma/Surgery:  No.  Pain:  None.  PE:  Previous DVT:  Right leg.  Anticoagulants:  Yes.  Other:   DUPLEX EXAM:                                             Bas/                IJV   SCV     AXV    BrachV  Ceph V                R  L  R   L   R  L   R   L   R  L  Thrombosis    o  o  o       o      o       o  Spontaneous   +  +  +       +      +       +  Phasic        +  +  +       +      +       +  Augmentation  +  +  +       +      +       +  Compressible  +  +  +       +      +       +  Competent     +  +  +       +      +       +  Legend:  + - yes  o - no  p - partial  D - decreased   SPONTANEOUS BAS/CEPH RIGHT:  +   IMPRESSION:  1. No evidence of deep venous thrombosis noted in the right upper      extremity.  2. 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   ___________________________________________  P. Liliane Bade, M.D.   MG/MEDQ  D:  07/12/2007  T:  07/12/2007  Job:  [id number]

## 2010-08-03 NOTE — Cardiovascular Report (Signed)
NAME:  RAYMONDO, GARCIALOPEZ NO.:  [id number]   MEDICAL RECORD NO.:  [id number]          PATIENT TYPE:  OIB   LOCATION:  1965                         FACILITY:  [facility]   PHYSICIAN:  Veverly Fells. Excell Seltzer, MD  DATE OF BIRTH:  [birth year]   DATE OF PROCEDURE:  09/26/2006  DATE OF DISCHARGE:                            CARDIAC CATHETERIZATION   PROCEDURES:  Left heart catheterization, right heart catheterization,  selective coronary angiography, saphenous vein graft angiography and  left internal mammary artery angiography.   INDICATIONS:  Mr. Edmister is a 70 year old gentleman who has been a  longstanding patient of Dr. Myrtis Ser.  He has had recurrent symptomatic  congestive heart failure.  He recently had a decrease in his left  ventricular function as assessed by a radionuclide study as well as an  echocardiogram.  His LVEF has been estimated at 35-40%.  He had some  anteroseptal ischemia on his stress study and was referred for a cardiac  catheterization.   PROCEDURAL DETAILS:  Risks and indications of the procedure were  explained to the patient.  Informed consent was obtained.  The right  groin was prepped, draped and anesthetized with 1% lidocaine.  Using the  modified Seldinger technique, a 4-French sheath was placed in the right  femoral artery and a 7-French sheath was placed in the right femoral  vein.  Right heart catheterization was performed with an end-hole  multipurpose catheter.  Pressures were recorded throughout the right  heart chambers and oxygen saturations were drawn from the superior vena  cava, left pulmonary artery and central aorta.  Cardiac output was  calculated via the Fick method.  Coronary angiography was performed with  a 4-French, JL-6 catheter and a 4-French, JR-4 catheter.  Following  selective angiography, the JR-4 catheter was used to image the saphenous  vein grafts.  It was then used to image the left internal mammary.  A  Wholey wire was  required to traverse a tortuous left subclavian artery.  Following angiography, an angled pigtail catheter was inserted into the  left ventricle and pressures were recorded.  A ventriculogram was  deferred due to renal insufficiency.  A pullback across the aortic valve  was done.  All catheter exchanges were performed over a guidewire.  There were no immediate complications.   FINDINGS:  Right atrial pressures:  A-wave 12, V-wave 11, mean of 9.  Right ventricular pressures:  36/8.  Pulmonary artery pressures 37/19  with a mean of 27.  Pulmonary capillary wedge pressures:  A-wave 21, V-  wave 23, mean of 18.  Left ventricular pressure:  144/20.  Aortic  pressure: 146/85 with mean of 108.   Oxygen saturation:  Superior vena cava 65%, pulmonary artery 56%, aorta  992%.   Fick cardiac output:  3.8 L per minute.  Fick cardiac index:  1.65 L/sq  m per minute.   Coronary angiography:  Left mainstem is angiographically normal.  It is  a large-caliber vessel.  It bifurcates in the LAD and left circumflex.   The LAD is a large vessel in its proximal aspect.  There is diffuse  disease  in the midportion of the LAD prior to the insertion of the  internal mammary artery.  The LAD has a 50% stenosis followed by a long  70% area of stenosis.  The first diagonal branch has minor  nonobstructive disease and has a medium-size vessel.  The mid and distal  LAD fill from competitive native and graft flow.  The mid and distal  vessel are diffusely diseased.   Left circumflex is a large-caliber vessel.  It courses down as a  nonobstructive disease throughout.  There are serial 20-30% lesions.  There are very small first and second OM branches and a larger third OM  branch that fills via native and graft flow.  There are no high-grade  obstructive lesions in the left circumflex system.   The right coronary artery is severely diseased.  There is a long segment  of 80-90% stenosis throughout the proximal  midportion of the vessel and  the vessel was then occluded in the distal aspect.  There is a well-  formed collateral arising from the conus branch supplying what appears  to be a left posterolateral branch of the left circumflex.   Saphenous vein graft angiography:  The saphenous vein graft to the right  PDA is widely patent.  The right AV segment and posterolateral branch  fill via retrograde flow from the graft.  The PDA is diffusely diseased  and is a small vessel.   Saphenous vein graft to the third OM branch is patent.  There is a valve  in the midportion of the graft, but there does not appear to be any  obstructive lesions in the graft.   LIMA to LAD is widely patent.  The LAD beyond the LIMA insertion site is  a small vessel.   ASSESSMENT:  1. Native three-vessel coronary artery disease.  2. Status post coronary bypass surgery with a patent saphenous vein      graft to the obtuse marginal branch of the circumflex, patent      saphenous vein graft to the posterior descending artery and patent      left internal mammary artery to left anterior descending.  3. Increased left ventricular filling pressures.  4. Low cardiac output.   PLAN:  Mr. Demarest will continue to require medical therapy.  He has  diffuse small vessel coronary artery disease, but has widely patent  bypass grafts and no areas that warrant intervention.  His left  ventricular filling pressures were mildly elevated and his cardiac index  is low by the Fick calculation.  Overall, his hemodynamic parameters are  under reasonable control with medical therapy.      Veverly Fells. Excell Seltzer, MD  Electronically Signed     MDC/MEDQ  D:  09/26/2006  T:  09/26/2006  Job:  [id number]   cc:   [name], MD, [facility]

## 2010-08-03 NOTE — Assessment & Plan Note (Signed)
[facility]                            CARDIOLOGY OFFICE NOTE   NAME:Guerin, ELRIC TIRADO              MRN:          [id number]  DATE:01/01/2008                            DOB:          10-17-1940    Mr. Walmsley is here for followup.  When I saw him last on December 04, 2007, I arranged for him to see Dr. Ladona Ridgel for followup.  Dr. Ladona Ridgel did  review his status and felt that amiodarone was no longer helping him and  decided to stop his amiodarone.  Since that time, Mr. Mottola has had  several good days and in fact he thinks his breathing may be somewhat  better.  He has gained a few pounds of real weight and we had a very  careful discussion about this.   PAST MEDICAL HISTORY:   ALLERGIES:  PENICILLIN.   MEDICATIONS:  See the flow sheet.   REVIEW OF SYSTEMS:  He is not having any fevers or chills.  He has no GI  or GU symptoms.  Otherwise review of systems is negative.   PHYSICAL EXAMINATION:  VITAL SIGNS:  Weight is 236 pounds.  Blood  pressure is 130/87.  Pulse is 80.  GENERAL:  The patient is oriented to person, time and place.  Affect is  normal.  He is here with his wife today.  He has long-term skin changes  related to amiodarone and sun toxicity.  NECK:  There are no carotid bruits.  There is no jugular venous  distention.  LUNGS:  Clear.  Respiratory effort is not labored.  CARDIAC:  An S1 with an S2.  There are no clicks or significant murmurs.  ABDOMEN:  Protuberant but soft.  He has no peripheral edema.   Problems are listed completely on the note of November 29, 2006.  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  #31.  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 Madilyn Fireman with no further treatment needed.  #32.  Weight gain.  We had a very careful discussion about losing a few  pounds.  He will work on this.   I need to see him back soon to be sure  that he remained stable as he is off amiodarone and to stay on top of  his progress very carefully.  He is a very complex patient.  I will see  him for followup in 6 weeks.     [name], MD, [facility]  Electronically Signed    JDK/MedQ  DD: 01/01/2008  DT: 01/02/2008  Job #: [id number]

## 2010-08-03 NOTE — Assessment & Plan Note (Signed)
[facility]                            CARDIOLOGY OFFICE NOTE   NAME:Willetts, ANUP BRIGHAM              MRN:          [id number]  DATE:07/06/2007                            DOB:          Jun 12, 1940    OFFICE VISIT NOTE:  Since the last visit, I have reviewed the CPX with  Dr. Nicholes Mango.  The patient definitely has some circulatory  abnormalities with his exercise test.  He did not have a significant  increase in heart rate.  It is possible that some of the findings could  be related to chronotropic incompetence.  Also other factors of course  would include diastolic dysfunction and other issues that would affect  cardiac output.  The plan will be to continue to adjust medications for  his overall volume status to optimize this and his cardiac output and  any issues related to diastolic dysfunction.  Also, I will once again  speak with Dr. Ladona Ridgel about whether or not adjustments can be made or  approaches can be made relative to his heart rate and overall cardiac  output.     [name], MD, [facility]  Electronically Signed    JDK/MedQ  DD: 07/06/2007  DT: 07/06/2007  Job #: [id number]

## 2010-08-03 NOTE — Assessment & Plan Note (Signed)
[facility]                            CARDIOLOGY OFFICE NOTE   NAME:Dobkins, SHERYL TOWELL              MRN:          [id number]  DATE:03/26/2007                            DOB:          13-Mar-1941    Mr. Arwood is seen for followup.  See my very extensive 4-page dictated  note concerning Mr. Holifield dated February 28, 2007.  The plans were  outlined at the end, and the patient's Diltiazem was stopped.  He was  switched to carvedilol.  Overall he feels about the same.  He still has  significant exertional fatigue.  He has not any chest pain.  There has  been no syncope or pre-syncope.   PAST MEDICAL HISTORY:   ALLERGIES:  PENICILLIN.   MEDICATIONS:  1. Vytorin.  2. Coumadin.  3. Metformin.  4. Potassium.  5. Amiodarone 400 mg daily.  6. Benazepril 40.  7. Aspirin.  8. Amaryl.  9. Furosemide 80 mg daily.  10.Carvedilol 6.25 b.i.d..   OTHER MEDICAL PROBLEMS:  See the problem list number 1-30 on my note of  February 28, 2007.   REVIEW OF SYSTEMS:  Other than his exertional fatigue, he is feeling  relatively well.  He has lost 2 more pounds.  He is here with his wife  today, as always.  Otherwise review of systems is negative.   PHYSICAL EXAM:  Blood pressure today is 141/89.  His rate is 70.  The patient is oriented to person, time and place.  Affect is normal.  HEENT:  Reveals no xanthelasma.  He has normal extraocular motion.  He has a grayish tint to the skin compatible with amiodarone therapy and  sun exposure.  Lungs are clear.  Respiratory effort is not labored.  Cardiac exam reveals S1-S2.  There are no clicks or significant murmurs.  The abdomen is protuberant.  He has trace peripheral edema.   EKG today reveals left bundle branch block.  He has sinus rhythm today  with a rate of 69.   Problems are listed extensively on the note of February 28, 2007.  He  has tolerated the medicine changes that I made.  I did not believe  it  would be wise to titrate his carvedilol dose any higher today.  He is  scheduled to see Dr. Ladona Ridgel to answer the questions that I outlined on  my note of February 28, 2007.  Does he need an ICD?  Does he have  chronotropic incompetence?  Would he benefit possibly from a flutter  ablation?  Should amiodarone be continued at this high dose with the  rhythm difficulties that we are having?  Would he benefit from pacing  function in general?  Would he benefit from biventricular pacing  (understanding that his question of atrial fib may cause difficulties  with this)?   No changes today.  Will see Dr. Ladona Ridgel on the 15th.  I will see him back  in approximately 4 weeks.     [name], MD, [facility]  Electronically Signed    JDK/MedQ  DD: 03/26/2007  DT: 03/26/2007  Job #: [id number]   cc:   Chrissie Noa  Minerva Ends, MD,FACP,FCCP

## 2010-08-03 NOTE — Assessment & Plan Note (Signed)
[facility]                            CARDIOLOGY OFFICE NOTE   NAME:Marteney, DEVAN DANZER              MRN:          [id number]  DATE:09/17/2007                            DOB:          April 16, 1940    Mr. Vandervliet is actually doing very well.  I saw him on June 28, 2007.  I  have not yet heard the final on the CPX study and then on my summary  memo in the chart of July 06, 2007.  The study showed that the patient  did have a cardiac abnormality and that it could be related to diastolic  dysfunction, but also chronotropic incompetence and he was referred back  to Dr. Ladona Ridgel.  Dr. Ladona Ridgel saw the patient on Jul 30, 2007, with a  complete note in the chart.  There was chronotropic incompetence.  He  adjusted his rate response feature on his defibrillator.  Within a week,  the patient clearly felt better.  He also had an injection in his back  that made him feel better and 2 things together caused him to improve  significantly.  He saw Dr. Ladona Ridgel in followup.  On that visit on September 05, 2007, there was question of fluid overload from decreased compliance  concerning his dietary intake.  Since that time, he has done well.  He  does have shortness of breath in the morning, which may be related to  his lungs.  However, as the day goes on and he gets up around, he is  doing much better.  His activity level has definitely increased.   PAST MEDICAL HISTORY:   ALLERGIES:  PENICILLIN.   MEDICATIONS:  1. Benazepril 40.  2. Vytorin 10/20.  3. Coumadin.  4. Potassium 10 b.i.d.  5. Aspirin 81.  6. Carvedilol 6.25 b.i.d.  7. Amiodarone 200 b.i.d.  8. Furosemide.  9. Metformin.  10.Glimepiride.   OTHER MEDICAL PROBLEMS:  See the extensive note of February 28, 2007.   REVIEW OF SYSTEMS:  He has some problems with a runny nose.  He has  tried some generic Claritin or Zyrtec and this does not help.  I told  him I did not have any other recommendations.  He  also has some  persistent swelling in his arm and this is chronic and we are aware of  this.  It has been fully evaluated by Dr. Madilyn Fireman.  Otherwise, review of  systems is negative.   PHYSICAL EXAMINATION:  VITAL SIGNS:  Blood pressure is 137/89.  The  pulse of 78.  His weight is 227 pounds, which is good for him.  GENERAL:  The patient is oriented to person, time, and place.  Affect is  normal.  SKIN:  Multiple areas of ecchymoses and also graying of the skin from  his amiodarone and sun.  HEENT:  No xanthelasma.  He has normal extraocular motion.  There are no  carotid bruits.  There is no jugular venous distention.  LUNGS:  Clear.  Respiratory effort is not labored.  CARDIAC:  An S1 with an S2.  There are no clicks or significant murmurs.  ABDOMEN:  Soft.  EXTREMITIES:  He has no peripheral edema today.   Problems are listed extensively on my note of February 28, 2007, with  problems number 1 through 30.  These are reviewed completely.  #4.  Low back pain.  This is better after his most recent injection.  #12.  Chronic obstructive pulmonary disease.  I believe this is the  basis of his shortness of breath when he gets up in the morning.  #15.  Left ventricular dysfunction.  We have him up completely off the  diltiazem.  He is on appropriate medications for left ventricular  dysfunction.  We will consider pushing these further.  I am hesitant to  push his carvedilol up since his chronotropic incompetence previously  was a significant problem for him.  I will consider that at the time of  his next visit.  #30.  Chronotropic incompetence.  This is significantly improved with  the changes that were made with reprogramming by Dr. Ladona Ridgel.  #31.  Right upper extremity swelling that is likely secondary to chronic  superficial thrombophlebitis with possible proximal venous stenosis  related to his defibrillator.  This is to be watched overtime.  He will  elevate his arm on a p.r.n. basis.   He could have an upper extremity  compression device if it would become a major problem.  He was stable  and did not need further followup with Dr. Madilyn Fireman about this particular  issue.   He is stable today and I am not changing his meds.  He does have  followup scheduled also with Dr. Ladona Ridgel.  Dr. Ladona Ridgel feels that it is  reasonable to push his carvedilol higher, this could be done.  For now,  I will not change his dose.     [name], MD, [facility]  Electronically Signed    JDK/MedQ  DD: 09/17/2007  DT: 09/18/2007  Job #: [id number]

## 2010-08-03 NOTE — Assessment & Plan Note (Signed)
[name]                            CARDIOLOGY OFFICE NOTE   NAME:[name], [name] MAZZONI              MRN:          [id number]  DATE:04/12/2007                            DOB:          11/20/1940    HISTORY:  The patient is currently hospitalized and I spoke with Dr.  Ladona Ridgel today.  He has had a complete EP evaluation.  He had an EP study.  He had at least 3 areas of atrial flutter in the left atrium and of  course these cannot be ablated in the left atrium.  He had a  biventricular ICD placed from the right chest by Dr. Ladona Ridgel that was  successful.  It is put in the right because the patient likes to shoot a  gun from the left shoulder.  He will be kept on high-dose amiodarone.  With this unit in place, we can use the Optival monitoring system to see  with his volume status is when we see him in the office.  This can be  interrogated by the EP nurses if needed.  Also we will be able to see  how much of the time he is in sinus rhythm.  He will remain on  amiodarone.  If with his new pacing system, he remains in sinus, we may  be able to back off some on the dose of the amiodarone over time.  He  also had a prolonged HB interval.  He had Wenckebach when paced at a  rate in the 90 range.  I am wondering if this led to the possibility of  a relative form of chronotropic incompetence when he walked recently and  maybe his symptoms will be improved for many reasons.   FOLLOW UP:  I will be seeing him back in the office for followup.     [name], MD, [facility]  Electronically Signed    JDK/MedQ  DD: 04/12/2007  DT: 04/12/2007  Job #: [id number]

## 2010-08-03 NOTE — Assessment & Plan Note (Signed)
I discussed the importance of a low sodium diet and continued medical therapy.

## 2010-08-03 NOTE — Discharge Summary (Signed)
NAME:  STEPHONE, GUM NO.:  [id number]   MEDICAL RECORD NO.:  [id number]          PATIENT TYPE:  OIB   LOCATION:  3735                         FACILITY:  [facility]   PHYSICIAN:  Doylene Canning. Ladona Ridgel, MD    DATE OF BIRTH:  [birth year]   DATE OF ADMISSION:  04/11/2007  DATE OF DISCHARGE:  04/12/2007                               DISCHARGE SUMMARY   TIME FOR THIS DICTATION:  Greater than 45 minutes.   ALLERGIES:  The patient has allergy to PENICILLIN.   FINAL DIAGNOSES:  1. Discharging day #1 status post implant Medtronic CONCERTO dual-      chamber cardioverter defibrillator with cardiac resynchronization      lead.  This was a difficult procedure.  Defibrillator threshold      study less than or equal to 15 joules.  2. During the procedure, left-sided atrial flutter is noted at least      three circuits anyway.   SECONDARY DIAGNOSES:  1. Ischemic cardiomyopathy with recent decline in ejection fraction.  2. 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  3. History of three-vessel coronary artery disease status post      coronary artery bypass graft surgery 2004.  4. Atrial arrhythmias with finding of atrial flutter during implant      procedure April 11, 2007.  Patient is on amiodarone.  5. Myoview study June 2008, ejection fraction 36%, no ischemia.  6. A 2-D echocardiogram November 2008, ejection fraction 30-40% with      paradoxical intraventricular septal motion.  7. Hypertension.  8. Dyslipidemia.  9. Infrainguinal arterial occlusive disease.  10.Fungal assault on skin surface of unclear etiology.  The patient      will be taken Gris-PEG for this.  11.The patient has left bundle branch block.   PROCEDURE:  April 11, 2007, electrophysiology study with implant of a  dual-chamber Medtronic CONCERTO cardioverter-defibrillator with  placement of cardiac resynchronization lead  in the left ventricle, Dr.  [name].  Defibrillator threshold study less than or equal to 15  joules.  During the procedure, three atrial flutter circuits were seen  in the left atrium.   BRIEF HISTORY:  Mr. Desroches is a 70 year old male.  He has a history of  ischemic heart disease and is status post coronary artery bypass graft  surgery in 2004.  The patient also was discovered to have an aortic  aneurysm and required resection at that time.   The patient's initial left ventricular function in 2004 was normal, but  his ejection fraction has declined and has averaged 35% in the last year  or so.   The patient also has atrial arrhythmias and has symptoms attributed to  atrial flutter.  He has documented atrial flutter on electrocardiogram.  The patient is on amiodarone 400 mg daily.   The patient stable except for New York Heart Association Class III  chronic systolic congestive heart failure.  The patient has chronic  shortness of breath.   The patient certainly qualifies for cardioverter defibrillator with  left  bundle branch block and paradoxic interventricular septal dyssynergy  would probably qualify for cardiac resynchronization.  The risks and  benefits have been described to the patient and his wife and they wish  to proceed.  The patient will present electively.   HOSPITAL COURSE:  The patient presented electively April 11, 2007.  He  underwent implantation of the defibrillator as described above.  The  placement of the left ventricular lead was difficult.  He had  defibrillator threshold study.  During the procedure,  three circuits of  left atrial flutter were observed.  These are in position that could not  be ablated.  The patient seems to have paroxysmal atrial flutter and is  on Coumadin and amiodarone.  I had extensive discussion with the patient  and his wife who are most interested in his prognosis and care going  forward.  Discussion of incision care and  mobility were also  entertained.  The patient is asked to keep his incision dry for next  seven days and to sponge-bathe until Wednesday April 18, 2007.   DISCHARGE MEDICATIONS:  He goes home on the following medications.  1. Gris-PEG 250 mg twice daily with food.  This a dermatologic      medication and he is to start this on "Sunday April 15, 2007.  2. Coumadin 2.5 mg daily except for 1.25 mg Tuesday and Thursday.  He      will probably need to increase this dose while he is on Gris-PEG      because this medication antagonizes Coumadin.  3. Enteric-coated aspirin 81 mg weekly.  4. Lasix 40 mg daily except 2 tablets Monday and Friday.  5. Potassium chloride 20 mEq daily.  6. Amiodarone 200 mg 2 tablets daily.  7. Benazepril 40 mg daily.  8. Carvedilol 6.25 mg twice daily.  9. Glimepiride 2 mg one half tablet twice daily.  10.Vytorin 10/20 one tablet daily at bedtime.  11.Metformin.  He is to hold off on taking this medication until      Saturday, April 14, 2007.   FOLLOW UP APPOINTMENTS:  All follow-up appointments are at [facility] on [address].  1. Thursday January 29.      a.     Coumadin Clinic at 9:30 (the INR may be LOW)      b.     To see Dr. Katz at 10 o'clock in the morning on January 29,       20" 09.      c.     Incision check at 10:30 in the morning April 19, 2007.  2. He sees Dr. Ladona Ridgel Monday, July 16, 2007, at 8:45.   The patient's INR on admission, April 11, 2007, was 2.2, the protime  was 25.5.  Other laboratory studies pertinent to this admission were  drawn April 05, 2007.  White cells are 9, hemoglobin 15.3, hematocrit  45.1, platelets of 200.  Serum electrolytes:  Sodium 139, potassium 4.2,  chloride 102, carbonate 27, glucose 78, BUN is 20, creatinine 1.6.      Maple Mirza, PA      Doylene Canning. Ladona Ridgel, MD  Electronically Signed    GM/MEDQ  D:  04/12/2007  T:  04/12/2007  Job:  [id number]   cc:   [name], MD,  [facility]

## 2010-08-03 NOTE — Assessment & Plan Note (Signed)
[facility]                         ELECTROPHYSIOLOGY OFFICE NOTE   NAME:[name], [name]              MRN:          [id number]  DATE:04/05/2007                            DOB:          [birth year]    [name] is referred today by Dr. Jerral Bonito to make recommendations  regarding treatment of atrial flutter as well as consideration for bi-V  ICD implantation.   HISTORY OF PRESENT ILLNESS:  The patient is a very pleasant middle-aged  male with a history of ischemic heart disease status post bypass surgery  in 2004.  The patient at that time also had aortic aneurysm surgery.  Initially he had normal LV function but then subsequent developed  worsening LV dysfunction such that his EF has averaged 35% in the last  year or so.  The patient has also had atrial arrhythmias and most  recently has had worsening atrial flutter symptoms with documented  flutter on EKG.  He has been up-titrated on his amiodarone dose to 400  mg a day by Dr. Myrtis Ser and he returns today for follow-up.  The patient  has otherwise been stable except for ongoing shortness of breath.   PAST MEDICAL HISTORY:  Notable for hypertension.  He has a history of  dyslipidemia is well and he has peripheral vascular disease.   MEDICATIONS:  1. Aspirin.  2. Coumadin as directed.  3. Metformin 500 mg daily.  4. Potassium.  5. Amiodarone 400 mg daily.  6. Benazepril 40 mg a day.  7. Carvedilol 6.25 mg twice daily.  8. Furosemide 80 mg daily.   SOCIAL HISTORY:  The patient is married.  He denies tobacco or ethanol  abuse.   REVIEW OF SYSTEMS:  Notable for heart failure symptoms as previously  noted, chronic class III.  Otherwise all systems reviewed and found to  be negative except for some very mild peripheral edema.   PHYSICAL EXAM:  He is a pleasant, middle-aged man in no acute distress.  The blood pressure was 143/85, pulse was 64 and regular, respirations  were 18.  HEENT:   Normocephalic and atraumatic.  Pupils equal and round.  The  oropharynx was moist.  The sclerae were anicteric.  NECK:  No jugular distention.  There was no thyromegaly.  Trachea is  midline.  The carotids are 2+ and symmetric.  LUNGS:  Clear bilaterally to auscultation.  No wheezes, rales or rhonchi  were present.  There was no increased work of breathing.  CARDIOVASCULAR:  A regular rate and rhythm.  Normal S1 and S2.  No  murmurs, rubs or gallops present.  The PMI was enlarged and laterally  displaced.  There was a soft S3 gallop present.  ABDOMEN:  Soft, nontender.  There was no organomegaly.  EXTREMITIES:  No peripheral edema.  Pulses were 1+ and symmetric  bilaterally.  NEUROLOGIC:  He as alert and oriented x3 with the cranial nerves intact.  Strength was 5/5 and symmetric.   His EKG demonstrates sinus rhythm with a left bundle branch block and a  QRS duration of 180 msec.   IMPRESSION:  1. Ischemic cardiomyopathy, ejection fraction 35%.  2. Congestive  heart failure, class III.  3. Atrial flutter, today maintaining sinus rhythm but previously in      atrial flutter on amiodarone.  4. Amiodarone therapy.  5. Class III heart failure.   DISCUSSION:  I have discussed treatment options with the patient and his  wife in detail.  I have recommend electrophysiologic study and catheter  ablation of atrial flutter, followed by insertion of a bi-V ICD.  There  risks, benefits, goals and expectations of the procedure have been  discussed with the patient, who would like to proceed with this.  It  will be scheduled at the earliest possible convenient time.     [name] Canning. Ladona Ridgel, MD  Electronically Signed    GWT/MedQ  DD: 04/05/2007  DT: 04/05/2007  Job #: [id number]

## 2010-08-03 NOTE — Assessment & Plan Note (Signed)
[facility]                            CARDIOLOGY OFFICE NOTE   NAME:[name], [name]              MRN:          [id number]  DATE:02/28/2007                            DOB:          Aug 12, 1940    [name] is back for followup. He had a second injection done by Dr.  Danielle Dess yesterday and he feels great. He has had very rapid resolution of  his low back pain and he feels well today.   He does tell me approximately one week ago he had some increase in his  shortness of breath. He is not having PND or orthopnea. He is not having  chest pain. There has been no syncope or pre-syncope.   Since his last visit, he did have a 2D echo. The ejection fraction is in  the 30-40% range. This is similar to the ejection fraction that was seen  in June of 2008. The EKG in the office today reveals that he is once  again out of sinus rhythm. His rate is controlled.   PAST MEDICAL HISTORY:   ALLERGIES:  PENICILLIN.   CURRENT MEDICATIONS:  1. Aspirin 81.  2. Metoprolol 25 b.i.d.  3. Vytorin 10/20.  4. Coumadin as directed.  5. Metformin 500.  6. Potassium.  7. Amiodarone 400 mg daily.  8. Benazepril 40.  9. Amaryl.  10.Furosemide 80 mg daily.  11.Diltiazem ER 180 daily.   OTHER MEDICAL PROBLEMS:  See the list below.   REVIEW OF SYSTEMS:  With his back feeling much better today, he is in  very good spirits. As mentioned, he does have exertional shortness of  breath that seems to have gotten worse approximately a week or week and  a half ago. Otherwise, his review of systems is negative.   PHYSICAL EXAMINATION:  Weight today is 239 pounds, which is decreased by  two pounds today. Blood pressure 130/80, pulse 75. The patient is  oriented to person, time and place. Affect is normal. He is here with  his wife today as always.  HEENT: Reveals no xanthelasma. He has normal extraocular motion. There  are no carotid bruits. There is no jugular venous  distention.  LUNGS:  Are clear. Respiratory effort is not labored at this time.  SKIN: Reveals diffuse discoloration related to his amiodarone therapy.  He also has multiple chronic ecchymoses.  CARDIAC: Reveals an S1, with an S2. There are no clicks or significant  murmurs.  ABDOMEN: Protuberant, but soft.  There is trace peripheral edema.   EKG today reveals his intraventricular conduction delay. The rhythm  today is not sinus. It may be atrial flutter with a flutter rate of 150  and a ventricular response rate in the range of 75.   PROBLEM LIST:  1. ALLERGY TO PENICILLIN.  2. Hypertension, treated.  3. History of pilonidal cyst in the past.  4. Low back pain. This is improved further by the second injection      yesterday by Dr. Danielle Dess.  5. **Atrial fibrillation, question atrial flutter. Once again, I      believe that there is a question of atrial flutter with a slow  rate. The patient continues to be on high dose amiodarone. He has      gone in and out of his arrhythmia. Most recently, he has converted      back out of sinus rhythm. He had converted spontaneously to sinus      rhythm just before I saw him earlier on the prior visit when we      were getting ready for cardioversion. There was a history of a left      atrial clot. This was followed and he had a cardioversion done with      TEE successfully in the past. After that time, there was another      cardioversion done without TEE and therefore historically when he      is fully anticoagulated, procedures can be done including      cardioversion without a TEE as long as his Coumadin is carefully      watched. It would appear that the patient does have increased      shortness of breath when he is not in sinus rhythm. This appears to      be the pattern as I have reviewed things carefully over the past      many months. He is not markedly limited by the increased shortness      of breath when he has it. However, it  would appear that sinus      rhythm will be better for him if it is doable. At this time, it is      not clear how to proceed. I have always been uncomfortable with his      very high dose amiodarone. I will need help from electrophysiology      to decide about this issue.  6. History in the remote past of an atrial clot. See the discussion      above.  7. History of claudication. 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 There is      irregular plaque in the superficial femoral and popliteal arteries.      There are 2 cm and 3 cm segments of stenosis in the mid thigh with      moderate to severe plaque in the lower extremities. There is      stenosis of the mid left femoral, superficial femoral at 50%.      However, ankle brachial indices are normal. The left posterior tib      is non-compressible and is a non-compressive open system with      medial calcification in the right posterior tib artery and the      peroneal artery are inaudible. Ultimately, this will need further      evaluation. However, claudication does not appear to be his major      problem. Low back pain is a bigger problem and this is much better      when he is injected by Dr. Danielle Dess.  8. Coronary artery disease. Catheterization done in July of 2008,      showed that he has three vessel disease, but his vein grafts and      left internal mammary artery were patent. He has increased filling      pressure. He also has low cardiac output.  9. Increased filling pressures. We have tried to diurese him      appropriately and I believe that his volume status is relatively  stable at this time.  10.Low cardiac output while in the cath lab. This may well be another      part of his problem. Cardiac index is 1.6, cardiac output is 3.8.      Etiology is not exactly clear. As outlined above, it is known that      he does have definite left ventricular dysfunction. I  cannot be      sure whether his rhythm is or is not playing a role with this.  11.Status post wedge resection of the left upper lung in the past.  12.Chronic obstructive pulmonary disease. This may play a role.      However, he did have full pulmonary function studies. Those studies      showed that there was good pulmonary function and I doubt that this      is playing a major role in his symptoms and therefore we think that      he does not have a major effect from his amiodarone.  13.History of some wide complex tachycardia in the past.  14.Coumadin therapy.  15.Ejection fraction. His most recent echo once again shows that his      ejection fraction is in the 30-40% range. Historically, it had      improved, but now it is remaining in this lower range. It is only      more recently that we have seen it in this range and therefore I      had not pushed hard on switching his metoprolol to Coreg and      titrating his Coreg up. I have always been concerned that he was on      Diltiazem and that this could be playing a role. Over the past      months, we have decreased his Diltiazem dose. However, it is now      time to try to stop it completely and see if we can manage to      switch his metoprolol to Coreg and increase the dose. However, I      have been worried that there has been some chronotropic      incompetence that could be playing a role with his rhythm and his      shortness of breath.  16.History of gout.  17.Chronic discoloration of his toes which is venous.  18.Mild mitral regurgitation.  19.Mild thickening of the aortic valve, but no aortic stenosis.  20.Thickening of the right ventricular wall with right ventricular      hypertrophy, but good right ventricular function historically. This      continues to be the case at the time of his last echo.  21.Status post abdominal aortic aneurysm repair by Dr. Liliane Bade in      the past.  22.History of an infected leg after his  bypass was treated.  23.History of chronic nasal drainage.  24.Status post significant injury to his right leg in the past that      was treated.  25.Easy bruisability on Coumadin and very low dose aspirin.  26.History of TSH in the past that was on the low side, but we have      rechecked it and it remains on the low-normal side.  27.History of excess beer consumption in the past. Excessive alcohol      per se is not a problem. However, calories do play a role for him.  28.Some renal insufficiency over time with a creatinine in the 1.2 to  1.7 range. We have to watch this carefully when we push his      diuretics.  29.History of coronary artery bypass graft in 2004.  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   Overall plan at this point will be two-fold. The first will be to  continue to adjust his medications because his left ventricular remains  decreased. I will switch him from metoprolol to carvedilol and try to  titrate the dose up over time and at the same time stop his Diltiazem.  The second main approach will be to get help to decide what to do next  about the approach to his rhythm and other electrophysiologic issues:  1. Does he need an ICD?  2. Does he have chronotropic incompetence?  3. Would he benefit from a flutter ablation?  4. Should amiodarone be continued at this high dose with the rhythm      difficulties that we are having?  5. Would he benefit from pacing function in general?  6. Would he benefit from biventricular pacing (understanding that his      atrial fibrillation may well cause difficulties with this)?   We will arrange for an Electrophysiology consultation. We will start the  changes in his other medications at this time. I have spent greater than  35 minutes reviewing, talking with the patient and working on this  evaluation.     [name], MD, [facility]  Electronically Signed    JDK/MedQ  DD: 02/28/2007  DT: 02/28/2007  Job #:  [id number]   cc:   Stefani Dama, M.D.  Titus Dubin. Alwyn Ren, MD,FACP,FCCP

## 2010-08-03 NOTE — Assessment & Plan Note (Signed)
[facility]                            CARDIOLOGY OFFICE NOTE   NAME:[name]              MRN:          [id number]  DATE:02/11/2008                            DOB:          December 05, 1940    [name] returns for followup.  We have him off amiodarone.  He is  doing well.  He says that his breathing in fact is doing better.  His  site was interrogated today, so that we could learn more about what his  rhythm has been over the past period of time.  He is in atrial  fibrillation most of the time, but he does still go in and out of sinus.  When I examined him today, he was quite regular at the time of the exam.  When interrogated, he was still in atrial fibrillation.  He is not  having significant chest pain.   PAST MEDICAL HISTORY:   ALLERGIES:  See the flow sheet.   MEDICATIONS:  See the flow sheet.   REVIEW OF SYSTEMS:  He is not having any fevers or chills.  He is not  having any GU symptoms.  He says that his constipation is better off  amiodarone.  Otherwise, review of systems is negative.   PHYSICAL EXAMINATION:  VITAL SIGNS:  Blood pressure is 132/84 with a  pulse of 70.  VITAL SIGNS:  The patient is oriented to person, time, and place.  Affect is normal.  HEENT:  No xanthelasma.  He has normal extraocular  motion.  NECK:  There are no carotid bruits.  There is no jugular venous  distention.  LUNGS:  Clear.  Respiratory effort is not labored.  CARDIAC:  S1 with an S2.  There are no clicks or significant murmurs.  ABDOMEN:  Soft.  SKIN:  He has skin changes from his amiodarone.  EXTREMITIES:  He has no significant peripheral edema.   Problems are listed all extensively on the prior notes.  He is stable  off amiodarone.  No change in his therapy.  I will see him back in 3  months.     [name], MD, [facility]  Electronically Signed    JDK/MedQ  DD: 02/11/2008  DT: 02/12/2008  Job #: [id number]

## 2010-08-03 NOTE — Assessment & Plan Note (Signed)
[facility]                         ELECTROPHYSIOLOGY OFFICE NOTE   NAME:[name], [name]              MRN:          [id number]  DATE:12/11/2007                            DOB:          [birth year]    [name] returns today for followup.  He is a very pleasant middle-  aged male with an ischemic cardiomyopathy, congestive heart failure,  atrial fibrillation, and left atrial flutter, also with diabetes who  returns today for followup.  He has continued to feel weak, fatigued,  and tired.  I have reprogrammed his device several months ago and he  initially had improvement, but had gradually worsened over the last  several months.  He returns today for followup.  On interrogation of his  defibrillator demonstrates that he has been in AFib essentially 100% of  the time, despite being on a high-dose amiodarone therapy (200 twice a  day).   OTHER MEDICATIONS:  1. Benazepril 40 a day.  2. Vytorin 10/20.  3. Warfarin as directed.  4. Aspirin 81 a day.  5. Carvedilol 6.25 twice a day.  6. Amiodarone 200 twice a day.  7. Furosemide 40 mg daily, 80 mg 2 times 2 days a week.  8. Metformin 500 mg twice a day.  9. Glyburide 2 mg half tablet daily.   PHYSICAL EXAMINATION:  GENERAL:  He is a pleasant middle-aged man, in no  distress.  VITAL SIGNS:  Blood pressure today was 118/77, the pulse 79 and  irregular, and respirations were 18.  The weight was 235 pounds.  NECK:  No jugular venous distention.  LUNGS:  Clear bilaterally to auscultation.  No wheezes, rales, or  rhonchi are present.  CARDIOVASCULAR:  Irregular rate and rhythm with normal S1 and a split  S2.  ABDOMEN:  Soft and nontender.  EXTREMITIES:  Trace peripheral edema.  There was fairly marked  discoloration over the skin consistent with amiodarone and ongoing sun  exposure.   IMPRESSION:  1. Chronic and severe fatigue and weakness with congestive heart      failure.  2. Persistent  atrial fibrillation unresponsive to amiodarone therapy.  3. Status post biventricular implantable cardioverter-defibrillator      insertion.  4. Dyslipidemia.   DISCUSSION:  [name] is, otherwise, stable, but continues to feel  fatigued and weak.  Because he is not maintaining sinus rhythm, I have  decided to abandon amiodarone.  I cannot exclude that he has developed  lung toxicity from amiodarone.  For this reason, I will stop his  amiodarone therapy now.  We will plan to see the patient back in the  office in several months.  I have asked that he continue his current  medicines and maintain a low-sodium diet.      Doylene Canning. Ladona Ridgel, MD  Electronically Signed    GWT/MedQ  DD: 12/11/2007  DT: 12/12/2007  Job #: [id number]

## 2010-08-03 NOTE — Assessment & Plan Note (Signed)
[facility]                         ELECTROPHYSIOLOGY OFFICE NOTE   NAME:Cardinal, ASON HESLIN              MRN:          [id number]  DATE:05/08/2007                            DOB:          27-Jun-1940    Mr. Gutridge returns today in followup.  He is a very pleasant, middle-  age male with a history of ischemic cardiomyopathy, congestive heart  failure, left bundle branch block, and atrial fibrillation and flutter  who has been maintained relatively consistently in sinus rhythm on  amiodarone.  The patient underwent BiV ICD insertion and returns today  for followup.  When I saw him when he came back to the office a week or  so ago, he was in atrial fibrillation, not feeling particularly well.  He subsequently reverted to sinus rhythm but still complains of dyspnea  and fatigue.  He may be slightly better but not clearly better.   Interrogation of his defibrillator today was carried out, and we  obtained EKGs with different RV and LV time intervals as well as  different AV time intervals.  The upshot was that his narrowest QRS was  142 milliseconds with BiV pacing utilizing an RV before LV of 40  milliseconds and overall AV delay of  180 sensed, 160 paced.  These in  fact were his parameters that he was changed to.  Otherwise, the patient  had no specific complaints.  He denies any intercurrent IC therapies.  He has had no significant peripheral edema.   PHYSICAL EXAMINATION:  GENERAL:  He is a pleasant middle-age man in no  acute distress.  VITAL SIGNS:  Blood pressure was 126/62. The pulse was 50 and irregular,  respirations 18.  Weight was 238 pounds.  NECK:  Revealed no jugular venous distention.  LUNGS:  Clear bilaterally to auscultation.  No wheezes, rales or rhonchi  are present.  CARDIOVASCULAR:  Exam revealed an irregular rhythm with normal S1-S2.  EXTREMITIES:  Demonstrated no edema.   EKG changes were as noted above.   Interrogation  of the ICD was as noted above with programming changes as  previously mentioned.   MEDICATIONS:  Medications are unchanged from previous clinic visit.   IMPRESSION:  1. Ischemic cardiomyopathy.  2. Congestive heart failure.  3. Left bundle branch block.  4. Status post biopsy BiV implantable cardioverter-defibrillator      insertion with initial lack of clinical improvement, now just      several weeks out from his surgery.   DISCUSSION:  Overall, we reprogrammed the patient's device today in  hopes of improving his heart failure.  We will see how he does with  this.  He will see Dr. Myrtis Ser back in the office in several weeks.     Doylene Canning. Ladona Ridgel, MD  Electronically Signed    GWT/MedQ  DD: 05/08/2007  DT: 05/09/2007  Job #: [id number]

## 2010-08-03 NOTE — Assessment & Plan Note (Signed)
OFFICE VISIT   [name] [name], [name] [name]  DOB:  01-12-1941                                       12/30/2009  NWGNF#:[id number]   I saw patient in the office today concerning his right leg pain.  He was  referred by Dr. Ranell Patrick.  This is [name] pleasant 70 year old gentleman who  has had [name] previous aortobifemoral bypass graft by Dr. Madilyn Fireman in 2004 for  an abdominal aortic aneurysm.  He has also had back fusion in July 2010  by Dr. Danielle Dess.  He had been having pain in the right leg prior to his  back surgery.  He is continuing to have pain in the right leg, and this  has not improved significantly.  He experiences pain from the knee down  on the right side which is aggravated sometimes by ambulating but also  occurs with simply standing.  He also states that he has paresthesias  from the knee down on the right side.  He has no significant  claudication on the left side.  He has had no rest pain on either side.  No history of nonhealing wounds.  He does state that his right leg  especially gives out when he is walking.   Past medical history is significant for adult onset diabetes,  hypertension, and coronary artery disease.  He has also had [name] previous  defibrillator/pacemaker.  He is followed by Dr. Myrtis Ser.  He has also had  previous coronary revascularization in 2004.  He had [name] myocardial  infarction prior to this.  He denies any history of congestive heart  failure or history of COPD.   SOCIAL HISTORY:  He is married.  He has 1 child.  He quit tobacco in  1995.   FAMILY HISTORY:  There is no history of premature cardiovascular  disease.   REVIEW OF SYSTEMS:  CARDIOVASCULAR:  He had no chest pain, chest  pressure, palpitations or arrhythmias.  He does admit to dyspnea on  exertion.  He had [name] previous DVT in 2004.  PULMONARY:  He has had no productive cough, bronchitis, asthma, or  wheezing.  MUSCULOSKELETAL:  He does have arthritis and muscle pain.   PHYSICAL  EXAMINATION:  This is [name] pleasant 70 year old gentleman who  appears his stated age.  Blood pressure is 127/87, heart rate is 67,  saturation 98%.  Lungs are clear bilaterally to auscultation without  rales, rhonchi or wheezing.  On cardiovascular exam, I cannot detect any  carotid bruits.  He has [name] regular rate and rhythm.  He has palpable  femoral pulses which are normal.  I can palpate [name] right dorsalis pedis  pulse.  I cannot palpate pulses on the left foot.  Both feet are warm  and well-perfused.  He has no evidence of atheroembolic disease.  He has  no significant lower extremity swelling.   I did independently interpret his arterial Doppler study which shows an  ABI of 100% on the right.  On the left side, the vessels are not  compressible.  He does have [name] triphasic dorsalis pedis signal on the  right.  Posterior tibial and peroneal signals could not be obtained.  His toe pressure on the right is 108 mmHg.  On the left side he has [name]  toe pressure of 90 mmHg with [name] biphasic dorsalis pedis signal and [name]  monophasic posterior tibial signal.   I have also reviewed his lumbar myelogram report.  This shows some mild  spinal stenosis at T12- L1, and at L3-L4, he has mild right foraminal  narrowing.  At L4-L5, L5-S1, he has moderate left-sided and mild right-  sided foraminal narrowing.   Based on his history, I do not think that his symptoms in the right leg  are related to peripheral vascular disease.  He does have evidence of  some tibial artery occlusive disease but has [name] palpable dorsalis pedis  pulse, [name] triphasic dorsalis pedis signal, and [name] normal ABI on the right.  Also given that he experiences these symptoms with simply standing, I do  not think this is consistent with arterial insufficiency.  He is  scheduled to see [facility] to further work this up.  He is also followed by Dr. Danielle Dess.  We will be happy to see him back at  any time if any new vascular issues  arise.     Di Kindle. Edilia Bo, M.D.  Electronically Signed   CSD/MEDQ  D:  12/30/2009  T:  12/31/2009  Job:  [id number]   cc:   Almedia Balls. Ranell Patrick, M.D.  [name], MD, [facility]  Stefani Dama, M.D.  [facility]

## 2010-08-03 NOTE — Assessment & Plan Note (Signed)
[facility]                            CARDIOLOGY OFFICE NOTE   NAME:[name], [name]              MRN:          [id number]  DATE:08/23/2006                            DOB:          29-Aug-1940    ADDENDUM:   Other thoughts for his shortness of breath include whether he could have  chronotropic incompetence in that, even though he has converted to sinus  rhythm, that possibly his rate is too slow.  Also, chest x-ray has been  reviewed.  I do not see any major infiltrates.  His heart size is large.  It is possible that we may need pulmonary function studies with a  diffusion capacity and he may need also a cardiopulmonary exercise test.     [name], MD, [facility]  Electronically Signed    JDK/MedQ  DD: 08/23/2006  DT: 08/23/2006  Job #: [id number]

## 2010-08-03 NOTE — Consult Note (Signed)
NAME:  [name], TARAS NO.:  [id number]   MEDICAL RECORD NO.:  [id number]          PATIENT TYPE:  INP   LOCATION:  3114                         FACILITY:  [facility]   PHYSICIAN:  Rollene Rotunda, MD, FACCDATE OF BIRTH:  07-19-1940   DATE OF CONSULTATION:  10/05/2008  DATE OF DISCHARGE:                                 CONSULTATION   PRIMARY CARDIOLOGIST:  [name], MD, [facility]   PRIMARY CARE PHYSICIAN:  Titus Dubin. Alwyn Ren, MD, FACP, FCCP   REQUESTING PHYSICIAN:  Stefani Dama, MD   REASON FOR CONSULTATION:  Elevated heart rate and pacemaker  interrogation.   HISTORY OF PRESENT ILLNESS:  This 70 year old male patient was well  known to Dr. [name] with history of ischemic cardiomyopathy, BiV  ICD, CAD and CABG along with atrial fibrillation on chronic Coumadin  therapy and hypertension who was admitted for lumbar disk surgery which  was completed on October 02, 2008.  Postoperatively, the patient had some  hypotension and was given some IV fluid replacement and is currently  anemic with hemoglobin of 8.8 and undergoing blood transfusion with 2  units of packed red blood cells.  The patient's heart rate remains  elevated with a heart rate of 110-120 and 30 at times.  He has not been  getting his preoperative medications, beta blockers or ACE inhibitor.  Postoperatively, these have been held secondary to postoperative  hypotension.  However, now he is hemodynamically stable and tachycardic.  The pacemaker is tracking his heart rate but there has been no pacemaker  pacing at this time.  We were asked for assistance and cardiac  management.   The patient is complaining of exhaustion and dyspnea with low back pain  and soreness in his right leg.  The patient has received one dose of IV  Lasix in between his units of packed red blood cells, heart rate remains  in 120s and he remains dyspneic.   REVIEW OF SYSTEMS:  Positive for shortness of breath, palpitations,  feeling tired with some back soreness and is also complaining of  abdominal distention and pain feeling constipated.  All other systems  are reviewed and found to be negative except for those mentioned.   PAST MEDICAL HISTORY:  1. CAD.      a.     Status post cardiac catheterization revealing severe three-       vessel CAD in 2004.      b.     Status post coronary artery bypass grafting LIMA to LAD, SVG       to OM and SVG to PDA in 2004.  2. Infrarenal aortic aneurysm repair 6.8 cm prior to repair which was      done in September 2004.  3. Hypertension.  4. Atrial fibrillation chronic on Coumadin.  5. Status post BiV ICD pacemaker placed January 2009.  6. Ischemic cardiomyopathy with an EF of 30-40%.  7. Hyperlipidemia.  8. Diabetes type 2.  9. Status post decompression and lumbar laminectomy of L2, L3, L4, L5      and S1.   PAST SURGICAL HISTORY:  CABG  three-vessel in 2004, cervical disk repair,  I and D of the right thigh.   Last echocardiogram dated November 2008 revealed an EF of 30-40%.   SOCIAL HISTORY:  The patient lives in [place] with his wife.  He is  retired.  He does not smoke.  He drinks three to four beers a day.  No  drugs are consumed.  Mother is deceased with a CVA.  Father deceased  from EtOH abuse.  He has one brother with diabetes.   CURRENT MEDICATIONS:  1. Cardizem 60 mg q.6 hours.  2. Aspirin 81 mg daily.  3. Dilaudid for pain.   ALLERGIES:  PENICILLIN.   MEDICATIONS PRIOR TO ADMISSION:  1. Lasix 40 mg b.i.d.  2. Enteric-coated aspirin 81 mg daily.  3. Benazepril 40 mg daily.  4. Carvedilol 12.5 mg b.i.d.   CURRENT LABORATORY DATA:  Hemoglobin 8.8, hematocrit 25.7, white blood  cells 10.0, platelets 95.  Sodium 138, potassium 4.0, chloride 109, CO2  23, BUN 22, creatinine 1.4, glucose 120.  BNP 118.  PT 14.8, INR 1.1,  phosphorus 4.0, magnesium 2.1.  Chest x-ray revealing persistent  densities throughout the left lung compatible with pleural  fluid and/or  atelectasis.  Cardiomegaly is also noted.  EKG revealing sinus  tachycardia AFib rate of 112 beats per minute with pacemaker tracking.   PHYSICAL EXAMINATION:  VITAL SIGNS:  Blood pressure 147/87, pulse 120,  respirations 30, O2 sat 99% on 3 liters.  GENERAL:  He is awake, alert and oriented, dyspnea with speaking.  HEENT:  Head is normocephalic and atraumatic.  Eyes:  PERRLA.  Mucous  membranes:  Mouth are pink and moist.  Tongue is midline.  NECK:  Supple.  Positive for HJR and mild JVD.  CARDIOVASCULAR:  Tachycardic, irregular without murmurs, rubs or  gallops.  LUNGS:  Diminished bibasilar inspiratory wheezes are noted  anterolaterally.  ABDOMEN:  Distended with decreased bowel sounds.  Tinkling is noted.  No  hepatomegaly is being able to be palpated.  EXTREMITIES:  Mild  nonpitting edema is noted bilaterally.  NEURO:  Cranial nerves II-XII are grossly intact with the exception of  some numbness and tingling in the right leg.   IMPRESSION:  1. Ischemic cardiomyopathy with an ejection fraction of 30-35%.  2. Atrial fibrillation.  3. Status post decompression of L2, L3, L4, L5 and S1 with laminectomy      of L2, L3, L4 and L5.  4. History of coronary artery disease status post three-vessel      coronary artery bypass grafting in June 2004.  5. History of infrarenal aortic aneurysm repair in September 2009.  6. Diabetes.   PLAN:  This is a 70 year old Caucasian male well-known to Dr. Myrtis Ser with  above-mentioned history who we were asked to see secondary to elevated  heart rate and pacemaker in the setting of mild CHF.  The patient has  been seen and examined by myself and Dr. Rollene Rotunda.  We agree with  IV diuresis and reassess in a.m.  We will start digoxin and low-dose  Coreg.  We will follow closely for cardiac management, add ACE inhibitor  in the a.m. after watching blood pressure and creatinine today.  His  heart rate may be related to pain and  constipation.  He is not  complaining of any chest pain right now; however, he does have some  increased dyspnea.  We will watch him closely and follow making further  recommendations throughout hospital course.   On  behalf of the physicians providers of [facility] we would  like to thank Dr. Danielle Dess for allowing [place] to participate in the care of  this patient.      Bettey Mare. Lyman Bishop, NP      Rollene Rotunda, MD, [facility]  Electronically Signed    KML/MEDQ  D:  10/05/2008  T:  10/06/2008  Job:  [id number]   cc:   Titus Dubin. Alwyn Ren, MD,FACP,FCCP

## 2010-08-03 NOTE — Assessment & Plan Note (Signed)
[facility]                            CARDIOLOGY OFFICE NOTE   NAME:[name], [name]              MRN:          [id number]  DATE:01/24/2007                            DOB:          [birth year]    [name] is seen for followup.  I saw him last on 11/29/2006.  He is  feeling somewhat better.  He has had an injection by Dr. Danielle Dess and a  good bit of his low back pain is improved.  He still has some pain in  his right hip and he and his wife are hoping that another injection may  be possible and that this may be more helpful.  They tell me that Dr.  Danielle Dess feels that he would potentially need extensive back surgery and  he is not recommending that at this time due to his overall medical  status.  He is not having any chest pain.  His breathing is somewhat  better.  This may be related to volume and may be related to the fact  that he is holding a sinus rhythm.  On this basis, I have decided to  continue to use the same dose of Amiodarone.   PAST MEDICAL HISTORY:  Allergies:  PENICILLIN.  Medications:  Aspirin, metoprolol, Vytorin, Coumadin, metformin,  potassium, Amiodarone 400, Benazepril 40, Amaryl, Furosemide 80,  diltiazem 180.  Other medical problems:  See the extensive list on my note of 09/28/2006.   REVIEW OF SYSTEMS:  Other than the HPI, his review of systems is  negative.   PHYSICAL EXAMINATION:  Weight is 241 pounds.  Blood pressure is 160/84.  His pulse rate is 60.  The patient is oriented to person, time and  place.  Affect is normal.  HEENT:  Reveals no Xanthelasmas.  He has normal extraocular motion.  NECK:  There are no carotid bruits.  There is no jugular venous  distention.  SKIN:  Has a grayish tint from his Amiodarone.  He also has ecchymoses  from his anticoagulation and his tan from the summer is beginning to  wear off.  It is noted that I have encouraged him in the past to be  careful to try to stay out of the sun  because of his Amiodarone.  CARDIAC:  Reveals an S1 and S2.  There are no clicks or significant  murmurs.  ABDOMEN:  Protuberant but soft.  EXTREMITIES:  He has no significant peripheral edema today.   Problems are listed extensively on the note of 09/28/2006.  #3.  Low back pain.  This is somewhat better as outlined above after the  injection by Dr. Danielle Dess.  He will follow up with Dr. Danielle Dess.  #4.  Atrial fib/?Atrial flutter.  He is holding sinus.  His EKG does  show sinus rhythm today.  We will continue to follow this.  #8.  Coronary disease, stable.  #9.  Increased filling pressures at the time of his most recent  catheterization and he will remain on 80 of Lasix.  #10. COPD.  Pulmonary function studies showed no major abnormalities.  #11.  Left ventricular function.  It is time to do  a followup echo to  reassess his left ventricular  function in sinus rhythm and then make further decisions about the  approach to his care.  I will see him back in  one month.     [name], MD, [facility]  Electronically Signed    JDK/MedQ  DD: 01/24/2007  DT: 01/24/2007  Job #: [id number]   cc:   Stefani Dama, M.D.  Titus Dubin. Alwyn Ren, MD,FACP,FCCP

## 2010-08-03 NOTE — Assessment & Plan Note (Signed)
[facility]                            CARDIOLOGY OFFICE NOTE   NAME:[name], [name]              MRN:          [id number]  DATE:07/18/2006                            DOB:          [birth year]    REASON FOR VISIT:  [name] is seen for followup today.  He is a very  complex patient.  See my complete problem list on the note of June 21, 2006.  At that time we decided to proceed with leg Doppler's.  This was  done on June 26, 2006.  The patient has some aneurysmal dilatation at  the distal anastomosis of the aorto-femoral bypass graft.  This measures  2.2 cm by 2.2 cm.  There is irregular plaque throughout the superficial  femoral artery and popliteal arteries.  On the left there is a 2 to 3 cm  segment of stenosis in the mid thigh.  There is moderate to severe  plaque throughout the lower extremities. The stenoses in the mid left  superficial femoral artery is greater than 50%.  The ankle brachial  indices remain normal.  The left PTA is non compressible consistent with  medial calcification and the right PTA and peroneal arteries are  inaudible.  We will consider peripheral vascular evaluation at some  point but he does not appear to be having claudication.  He is not  having any chest pain.  He is not having any significant shortness of  breath.  He has new edema which seems more marked than in the past. It  comes up rapidly in the morning.  I believe that there is both a volume  component and a venous insufficiency component.  He continues to have  significant easy bruisability.  He is on Coumadin and aspirin.  Labs  were checked.  His TSH is on the low-normal side.  This will be followed  over time.   PAST MEDICAL HISTORY:  For other medical problems, see the extensive  list on the note of June 21, 2006, problems #1 through #24.   ALLERGIES:  PENICILLIN.   MEDICATIONS:  1. Aspirin 81 mg.  2. Metoprolol 25 mg b.i.d.  3. Diltiazem  ER 180.  4. Vytorin 10/20.  5. Coumadin as directed.  6. Furosemide 40 (to be increased to 40 mg b.i.d.).  7. Metformin.  8. Potassium.  9. Amiodarone, which had been cut to 350 mg daily, will be increased      back to 400 mg daily.  10.Benazepril 40.  11.Amaryl.   REVIEW OF SYSTEMS:  The issues are listed in the HPI.  The patient  continues to have low back pain and he will return to see Dr. Danielle Dess in  the near future.   PHYSICAL EXAMINATION:  VITAL SIGNS:  His weight today is 240 pounds,  increased by 5 pounds since June 21, 2006.  Blood pressure is 138/100,  which is also increased.  Pulse is 78.  GENERAL APPEARANCE:  The patient is here with his wife today.  He is  oriented to person, time and place.  Affect is normal.  SKIN:  Reveals marked ecchymoses from easy bruisability.  HEENT:  Reveals no xanthelasma.  He has normal extraocular motion.  NECK: There are no carotid bruits.  There is no jugular venous  distention.  LUNGS:  Clear.  Respiratory effort is not labored.  CARDIAC:  Exam reveals an S1 with an S2. There are no clicks or  significant murmurs.  His rhythm is irregularly-irregular.  ABDOMEN:  His abdomen is protuberant.  It is bigger than in the past and  it is more firm.  He has 1+ to 2+ peripheral edema today.   CLINICAL DATA:  EKG today does show that he has atrial flutter at this  time with a controlled rate.   PROBLEM LIST:  Problems are listed completely on the note of June 21, 2006.  Using the numbers from that list:  #4:  Low back pain.  The patient will see Dr. Danielle Dess in followup.  #5:  History of atrial fibrillation.  Today, he has atrial flutter.  I  do not think he is a candidate, at this time, for flutter ablation as he  has had atrial fibrillation also.  This will be kept in mind.  His  amiodarone dose will be pushed back up to 400 mg daily.  Over time, I  will ask for electrophysiology evaluation to help with this management,  as I have had him  on high dose medications for a long period of time.  #8:  History of some claudication.  This does not appear to be the major  problem.  See the full description of the Doppler's above.  He will need  peripheral vascular evaluation at some point, but there is no rush at  this time.  #18:  Status post abdominal aortic aneurysm repair by Dr. Madilyn Fireman.  As  mentioned from the Doppler, he had some mild aneurysmal dilatation  distally and this will be followed up over time.  #22:  Easy bruisability.  He has to be careful.  #23:  TSH has been slightly low in the past.  It is once again low  normal but there is no need to change anything.  He is not on thyroid  replacement.  The TSH is 0.68.  #24:  Leg weakness.  This does not appear to be primarily vascular.  See  the Doppler data.  He does have peripheral disease and further work up  will be indicated at some point.  #25:  (New problem)  Increased edema.  I believe this is a combination  of his venous status and his fluid status.  His Lasix will be increased  to 40 mg p.o. b.i.d.  I told him to watch his salt intake and cut his  fluid back, also he is to cut down on the amount of beer that he is  drinking.  #26:  (New problem)  Increased weight.  I believe this is a combination  of fluid and calories.  Once again, he needs to cut his fluid, calories  and beer intake.  #27:  (New problem)  Excess beer consumption.  I am now worried about  this.  The patient's albumin is low.  I do not know if it is related to  this.  It will be important for him to cut his intake down.  #28:  (New problem)  Renal status.  BUN is 26 and creatinine 1.7.  This  will be followed over time.   DISCHARGE INSTRUCTIONS:  He will make the changes I have requested.  His  Lasix will be  increased to 40 b.i.d. and I will see him back along with  changing his amiodarone dose.     [name], MD, [facility] Electronically Signed    JDK/MedQ  DD: 07/18/2006  DT:  07/18/2006  Job #: [id number]   cc:   Stefani Dama, M.D.  Titus Dubin. Alwyn Ren, MD,FACP,FCCP

## 2010-08-03 NOTE — Assessment & Plan Note (Signed)
[facility]                            CARDIOLOGY OFFICE NOTE   NAME:Pokorski, ADHVIK CANADY       MRN:          [id number]  DATE:08/02/2006                            DOB:          [birth year]    [name] is seen for followup.  See my extensive note of June 21, 2006, and the complete note followup on July 18, 2006.  On April 29, I  increased his Lasix to 40 b.i.d.  We talked at length about cutting his  salt and fluid intake, and specifically cutting down the amount of beer  he was drinking.  He has diuresed.  His edema is greatly improved.  He  is clearly feeling better.  His back pain also is somewhat better.  He  is not feeling any significant palpitations.   PAST MEDICAL HISTORY:   ALLERGIES:  PENICILLIN.   MEDICATIONS:  See the medication lists on the prior notes.  His  amiodarone currently is at 400 mg daily, and his Lasix is at 40 b.i.d.   OTHER MEDICAL PROBLEMS:  See the extensive list on June 21, 2006.   REVIEW OF SYSTEMS:  He is stable, and see the HPI.   PHYSICAL EXAMINATION:  Weight today is 237 pounds.  Blood pressure is  128/78 with a pulse of 68.  The patient is here with his wife.  He is oriented to person, time, and  place, and his affect is normal.  HEENT:  Reveals no xanthelasma.  He has normal extraocular  motion.  There are no carotid bruits.  There is no jugular venous distension.  He has darkened skin compatible with his amiodarone and sun exposure,  and he has multiple ecchymoses from his Coumadin therapy.  LUNGS:  Clear.  Respiratory effort is not labored.  CARDIAC EXAM:  Reveals an S1 with an S2.  There are no clicks or  significant murmurs.  His abdomen is obese but softer than it has been, and his edema is  greatly decreased.   Problems are listed extensively on the note of June 21, 2006.  1. Low back pain, somewhat better.  2. Atrial fibrillation.  EKG is not done today.  The rhythm is      irregular  on physical exam.  I have not done an EKG as this will      not change my therapy.  He is on amiodarone 400 mg daily.  Will      check an EKG next visit.  3. Edema.  This is significantly improved.  4. Renal dysfunction.  Will recheck a BMET today after diuresis.   He will remain on the same meds, and I will see him back in 3 weeks.     [name], MD, [facility]     JDK/MedQ  DD: 08/02/2006  DT: 08/02/2006  Job #: [id number]   cc:   Stefani Dama, M.D.  Titus Dubin. Alwyn Ren, MD,FACP,FCCP

## 2010-08-03 NOTE — Assessment & Plan Note (Signed)
[facility]                         ELECTROPHYSIOLOGY OFFICE NOTE   NAME:[name] [name] ANTRIM              MRN:          [id number]  DATE:04/03/2008                            DOB:          Oct 06, 1940    [name] [name] returns today for followup.  He is a very pleasant middle-  aged male with a long-standing ischemic cardiomyopathy, congestive heart  failure, atrial fibrillation, and bundle-branch block.  He is status  post biv ICD insertion.  He returns today for followup.  The patient has  noted that last several days, he has felt more fatigue and tired, feels  like there is fullness in his chest.  He denies chest pain, however.  He  does not have palpitations.   MEDICATIONS:  1. Benazepril 40 a day.  2. Warfarin as directed.  3. Aspirin 81 a day.  4. Carvedilol 6.25 twice daily.  5. Furosemide 40 mg daily, 5 days a week, and 40 mg twice daily on      Monday and Friday.  6. Also, he is on metformin 500 twice daily.  7. Potassium 10 twice daily.  8. Simvastatin 20 a day.   PHYSICAL EXAMINATION:  GENERAL:  The patient is a very pleasant middle-  aged man in no distress.  VITAL SIGNS:  Blood pressure today was 130/70, the pulse 70 and regular,  respirations were 18, and the weight was 237 pounds.  NECK:  No jugular venous distention.  LUNGS:  Clear bilaterally to auscultation except for rales in the bases  bilaterally.  There is no increased work of breathing.  CARDIOVASCULAR:  Regular rate and rhythm.  Normal S1 and S2.  The PMI  was enlarged and laterally displaced.  ABDOMEN:  Soft, nontender.  EXTREMITIES:  No obvious edema.   Interrogation of his defibrillator demonstrates a Medtronic [place].  The P-waves (fib waves) were 1.  The R-waves were 12.  The impedance 512  in the right atrium, and 576 in the right ventricle, and 480 in the left  ventricle.  Threshold was 1.5 at 0.4 in the right ventricle and 4 at 0.9  in the left ventricle.   He was 70% V-paced.  Review of OptiVol fluid  index demonstrated that he had crossed over the OptiVol threshold about  4 days ago.   IMPRESSION:  1. Ischemic cardiomyopathy.  2. Congestive heart failure.  3. Status post biventricular implantable cardioverter-defibrillator      insertion secondary to ischemic cardiomyopathy and congestive heart      failure.   DISCUSSION:  [name] [name] is stable, but he has crossed over the OptiVol  fluid index indicating that he has some volume overload and his crossing  over of the fluid index approximately 4 days  ago correlates when he developed his symptoms.  To this end, I have  asked that he increase his Lasix to 40 mg twice daily for the next 3  days, before going back to his usual dose.  I will see the patient back  in the office in several months.  He has got a followup with Dr. Myrtis Ser.     Doylene Canning. Ladona Ridgel, MD  Electronically  Signed    GWT/MedQ  DD: 04/03/2008  DT: 04/04/2008  Job #: [id number]

## 2010-08-03 NOTE — Assessment & Plan Note (Signed)
[facility]                            CARDIOLOGY OFFICE NOTE   NAME:Aye, YAASIR MENKEN              MRN:          [id number]  DATE:06/28/2007                            DOB:          04-Jan-1941    Mr. Orsak is here for followup.  See my note of May 28, 2007.  His  shortness of breath is somewhat better, but he is still limited.  He did  have a cardiopulmonary exercise test.  I have preliminary data.  He has  a fair amount of dead space, suggesting that he may have circulatory  problems.  I will review this more completely with Dr. Gala Romney.  I  told the patient and his wife today that I could not add anything at  this point, until I get further assessment of his test.  His test had  been scheduled for earlier, but there was equipment problems, and it was  not done until yesterday and therefore could not be officially read by  today.   PAST MEDICAL HISTORY:  Allergies:  PENICILLIN.   MEDICATIONS:  Glimepiride, benazepril, Vytorin, Coumadin, metformin,  potassium, aspirin, carvedilol, amiodarone, furosemide.   OTHER MEDICAL PROBLEMS:  See the complete list of his problems, on my  very extensive note of February 28, 2007.   REVIEW OF SYSTEMS:  His review of systems today is negative.   PHYSICAL EXAM:  VITAL SIGNS:  Blood pressure is 114/74 with a pulse of  70.  GENERAL:  The patient is oriented to person, time and place.  Affect is  normal.  HEENT:  Reveals no xanthelasma.  He has normal extraocular motion.  There are no carotid bruits.  There is no jugular venous distention.  LUNGS:  Clear.  Respiratory effort is not labored.  CARDIAC:  Exam reveals S1 with an S2.  There is a soft systolic murmur.  ABDOMEN:  Protuberant.  He has no significant peripheral edema.   PROBLEMS:  Are listed on the prior note in December.  His current  shortness of breath is still unexplained.  I will await the final  cardiopulmonary exercise test.  If it  appears to be a circulatory  problem, I will talk to Dr. Ladona Ridgel again to see what, if anything, we  can do from the EP viewpoint that might help him.     [name], MD, [facility]  Electronically Signed    JDK/MedQ  DD: 06/28/2007  DT: 06/28/2007  Job #: [id number]

## 2010-08-03 NOTE — Discharge Summary (Signed)
NAME:  [name], [name] NO.:  [id number]   MEDICAL RECORD NO.:  [id number]          PATIENT TYPE:  INP   LOCATION:  3032                         FACILITY:  [facility]   PHYSICIAN:  Stefani Dama, M.D.  DATE OF BIRTH:  1940-05-19   DATE OF ADMISSION:  10/02/2008  DATE OF DISCHARGE:  10/16/2008                               DISCHARGE SUMMARY   ADMITTING DIAGNOSIS:  Lumbar spondylosis and stenosis L2-3, L3-4, L4-5,  and L5-S1.   SECONDARY DIAGNOSES:  1. Diabetes mellitus type 2.  2. Hyperlipidemia.  3. Hypertension.  4. Coronary artery disease.  5. Atrial fibrillation.  6. Peripheral vascular disease for claudication.  7. Low cardiac output.  8. Chronic obstructive pulmonary disease.  9. Chronic Coumadin therapy.  10.History of gout.  11.Mild mitral regurgitation.  12.Renal insufficiency.  13.History of cardiac pacemaker.   DRUG ALLERGY:  PENICILLIN.   OPERATIONS AND PROCEDURES:  1. Laminectomy of L2, L3, L4, and L5.  2. Decompression of L2, L3, L4, and L5 nerve root.  3. Posterolateral interbody fusion and decompression with peak spacer      L2-3, L3-4, L4-5, and L5-S1.  4. Pedicle screw fixation L2 to the sacrum with posterolateral fusion.   CONSULTATIONS:  Critical Care Medicine, [name], MD, [facility] from  Cardiology for assistance with medical management and is medically  complicated gentleman.   DISCHARGE DIAGNOSES:  Lumbar spondylosis and stenosis L2-3, L3-4, L4-5,  and L5-S1.   BRIEF HISTORY AND HOSPITAL COURSE:  The patient is a 70 year old  gentleman who has multiple medical problems with known significant  coronary artery disease, left ventricular dysfunction with a pacer in  place who has longstanding lumbar spondylosis and spinal stenosis L2  through the sacrum.  Surgical clearance was obtained from Cardiology to  proceed with extensive lumbar surgery of posterior spinal fusion L2 to  the sacrum and decompression.  The patient failed  conservative care, had  debilitating pain.  Informed consent was given that this was a extensive  operation and that he endorses significant risk due to his comorbidities  and multiple medical problems.  He elects to proceed with surgery.  He  underwent surgical intervention on October 02, 2008, he tolerated  procedure, stabilized in the recovery room and placed in the  neurosurgical ICU postoperatively.  He was placed on PCA pain pump,  started on his home medications.  Critical Care Medicine was consulted  for assistance with medical management due to his COPD and cardiac  status, and he also has history of renal insufficiency.  First day  postoperatively, the patient was out of bed to the chair.  Foley  catheter was discontinued.  His hemoglobin is 11.6 and hematocrit 34.0.  Coumadin therapy is going to be reinitiated.  His a-line was  discontinued.  Dr. Myrtis Ser was contacted for Cardiology consult for  assistance with fluid management.  His creatinine increased to 2.4.  Foley catheter was reinserted and ultimately discontinued prior to his  discharge.  He developed some acute renal failure and acute respiratory  failure, and continued close observation in neurosurgical ICU managed by  Critical  Care Medicine.  The patient made slow progress with physical  therapy.  On October 05, 2008, he dropped his hemoglobin to 8.8, 2 units of  packed RBCs were transfused to help with cardiac output due to his  increased heart rate.  His ACE inhibitors were held due to his renal  function and he was diuresed by Cardiology.  During his stay in the  neurosurgical ICU, he had some mild congestive heart failure left-sided.  He continued to make slow progress, was ultimately transferred to 3000  when he was stabilized.  We obtained consult from rehab for  comprehensive inpatient rehabilitation, it was felt that he was too high  functioning to qualify.  He was kept in the hospital until he was safe  for discharge  home.  Continued with medical management per Cardiology.  Until his discharge, he was placed back on his Coumadin and therapeutic  at the time of discharge to home.  He continued to make good progress  and was ready for discharge home on October 16, 2008.  He was eating well,  voiding well, neurovascularly intact, and ambulating safely.  His INR  was 2.7, with PT of 30.9.   DISCHARGE CONDITION:  Stable, improved.   DISCHARGE INSTRUCTIONS:  Discharge home.  Continue with back  precautions, home health physical therapy in hospital bed, durable  medical equipment for home use.   FOLLOWUP:  1. Follow up with Dr. Danielle Dess in 2-3 weeks.  2. Follow up with Cardiology in 2 weeks.   MEDICATIONS:  Given prescription for Percocet and Robaxin.   There was change in his home medications.  He is to continue on:  1. Aspirin 81 mg daily.  2. Benazepril 20 mg daily.  3. Coreg 25 mg b.i.d.  4. Simvastatin 40 mg daily.  5. Warfarin 2.5 mg daily.  6. Metformin 500 mg b.i.d.  7. K-Lor 10 mg b.i.d.  8. Glimepiride 2 mg one-half tablet b.i.d.  9. Lasix 40 mg b.i.d.  10.Tylenol Arthritis p.r.n.  11.Percocet 5/325 one p.o. q.4-6 h. p.r.n. pain.  12.Robaxin 500 mg 1 p.o. q.8 h. p.r.n. pain.   Follow up with Cardiology as an outpatient.  Contact our office prior to  follow up for any question concerns.  Continue with back precautions and  safe ambulation.  All questions encouraged, answered, and addressed.      [facility].      Stefani Dama, M.D.  Electronically Signed    SCI/MEDQ  D:  11/05/2008  T:  11/05/2008  Job:  [id number]

## 2010-08-03 NOTE — Assessment & Plan Note (Signed)
[facility]                            CARDIOLOGY OFFICE NOTE   NAME:[name], [name] FETTERMAN              MRN:          [id number]  DATE:11/29/2006                            DOB:          06-04-1940    SUBJECTIVE:  [name] returns today for cardiology  followup.  We had been trying to prepare him for possible cardioversion.  Now that his INR is finally stabilized appropriately, he has converted  to sinus rhythm on his own.  His volume status is relatively stable.  I  believe he may be up a pound or two and we will increase his Lasix dose  slightly for a few days.  Also, when he was here last, he thinks that  overall his breathing is somewhat better.  He did have pulmonary  function studies recently and they looked good.  He needs to lose weight  and we have talked about that at great length.   The patient is having problems with difficulty with one of his legs and  he wants to follow up with Dr. Danielle [name] and I believe he is stable to do  so.   PAST MEDICAL HISTORY:   ALLERGIES:  PENICILLIN.   MEDICATIONS:  1. Aspirin 81.  2. Metoprolol 25 b.i.d.  3. Vytorin 10/20.  4. Coumadin as directed.  5. Metformin.  6. Potassium.  7. Amiodarone 400 mg daily.  8. Benazepril 40.  9. Amaryl 1 mg.  10.Furosemide 40 b.i.d.  11.Diltiazem ER 180 daily.   OTHER MEDICAL PROBLEMS:  See the extensive lists on my prior notes  including September 28, 2006.   REVIEW OF SYSTEMS:  He is having difficulty with his right leg.  He does  have shortness of breath but it is somewhat improved.  Otherwise, his  review of systems is negative.   PHYSICAL EXAMINATION:  VITAL SIGNS:  Weight today is 242 pounds.  This  is increased by 2 to 3 pounds and I believe some of this is fluid.  He  must begin to lose weight.  Blood pressure is 136/84 with a pulse of 61.  GENERAL APPEARANCE:  The patient is oriented to person, time and place.  Affect is normal.  He is  here with his wife today.  LUNGS:  Clear.  Respiratory effort is not labored.  HEENT:  Reveals no xanthelasma.  He has normal extraocular motion.  NECK:  There are no carotid bruits.  There is no jugular venous  distention.  SKIN:  He has graying of his skin related to his amiodarone therapy.  CARDIAC:  Exam reveals an S1 with an S2.  There are no clicks or  significant murmurs.  ABDOMEN:  Protuberant but soft.  He has normal bowel sounds.  EXTREMITIES:  He does have 1+ peripheral edema and is probably slightly  more than at the time of his last visit.   LABORATORY DATA:  Electrocardiogram reveals he has his old left bundle  branch block.  He has broad P waves but he is in sinus rhythm and his  underlying rate is 61.   PROBLEMS:  Problems are listed extensively on the  note of September 28, 2006  and are reviewed completely.   PROBLEM #3:  LOW BACK PAIN:  He is having some difficulty with the right  leg.  He needs to see Dr. Danielle [name] for followup and this will be arranged.  The patient is stable and is cleared from the cardiac view point for any  procedure by Dr. Danielle [name].   PROBLEM #5:  ATRIAL FIBRILLATION, ? ATRIAL FLUTTER:  As outlined above,  he has converted to sinus rhythm.  He is on  high dose amiodarone and I  will continue this for now.  If this rhythm returns, we will consider  whether or not there is any chance there is a component of flutter and  whether there is any chance that ablation could be done.   PROBLEM #8:  CORONARY DISEASE:  This is stable at this time.   PROBLEM #9:  INCREASED FILLING PRESSURES AT THE TIME OF HIS  CATHETERIZATION:  I do believe he is slightly overloaded today and will  increase his Lasix to 80 b.i.d. for two days and then back to 40   PROBLEM #12:  CHRONIC OBSTRUCTIVE PULMONARY DISEASE:  He did have  pulmonary function studies and his lungs are working relatively well.   The patient does not have an ICD in place.  He is on a beta blocker and  we  cannot push the dose any higher.  He is on an ACE inhibitor.  He is  on diuretics.   RENAL INSUFFICIENCY:  This has been relatively stable.   PLAN:  For now, we will leave the patient in sinus rhythm and see how he  feels over time.  I will consider a followup 2-D echocardiogram at some  point to see what his left ventricular function is with sinus rhythm.  He will see Dr. Danielle [name] in followup.  I will see him back in  approximately 2  months.  We will then continue to think through all of the complicated  issues related to his health care.     [name], MD, [facility]  Electronically Signed    JDK/MedQ  DD: 11/29/2006  DT: 11/30/2006  Job #: [id number]   cc:   Stefani Dama, M.D.  Titus Dubin. Alwyn Ren, MD,FACP,FCCP

## 2010-08-03 NOTE — Assessment & Plan Note (Signed)
[facility]                         ELECTROPHYSIOLOGY OFFICE NOTE   NAME:[name], NICANOR MENDOLIA              MRN:          [id number]  DATE:07/30/2007                            DOB:          1940/09/24    Mr. Sainz returns today for followup.  He is a very pleasant middle-  aged man with ischemic cardiomyopathy and congestive heart failure and  persistent atrial arrhythmias.  He returns today for followup.  He has  had ongoing shortness of breath despite BiV ICD implantation.  He has  recently undergone cardiopulmonary stress testing where he was noted to  have fairly profound chronotropic incompetence with heart rates that  reached about 90 beats per minute.  The rate response feature of his  defibrillator was not turned on.  He has fairly severe bradycardia.  Other problems include diabetes and hypertension and probably some COPD.   MEDICATIONS:  1. Glyburide 2 mg one-half tablet twice daily.  2. Benazepril 40 mg daily.  3. Vytorin 10/20 daily.  4. Warfarin, as directed.  5. Metformin 500 mg t.i.d.  6. Potassium 20 meq daily.  7. Aspirin 81 a day.  8. Carvedilol 6.25 twice daily.  9. Amiodarone 200 twice a day.  10.Furosemide 40 mg twice daily two times a week.   PHYSICAL EXAMINATION:  GENERAL:  He is a pleasant middle-aged man who is  in no acute distress.  VITAL SIGNS:  Blood pressure was 132/80, the pulse 74 and regular,  respirations were 18.  The weight was 235 pounds.  NECK:  No jugular distention.  LUNGS:  Clear bilaterally to auscultation.  No wheezes, rales, or  rhonchi are present.  CARDIOVASCULAR:  Regular, rate, and rhythm.  Normal S1-S2.  No murmurs,  rubs, or gallops present.  EXTREMITIES:  Trace peripheral edema bilaterally.   Interrogation of his defibrillator demonstrates a Medtronic [place].  Fib waves were 1.3, the R waves 11.  The impedance 440 in the atrium,  456 in the ventricle, 392 in the left ventricle.   Threshold was 1 at 0.4  in the RV and 5 at 0.8 in the LV.  Battery voltage was 3.19 volts.  There were no intercurrent IC therapies.  Today, he was mode switched in  an atrial fibrillation about 50% of the time.  He was 75% V paced, 15% A  paced.   IMPRESSION:  1. Chronotropic incompetence.  2. Ischemic cardiomyopathy.  3. Congestive heart failure.  4. Persistent atrial fibrillation.   DISCUSSION:  Mr. Perreira heart rate is suboptimally controlled with  his current medications.  I have recommended that we turn his rate  response feature on on his defibrillator, and will have it done so.  We  have also increased the outputs on his LV lead, as he is close to  subthreshold on his device.  I will plan to see him back in office in 1  month with additional adjustments of the device pending the result of  his followup.     Doylene Canning. Ladona Ridgel, MD  Electronically Signed    GWT/MedQ  DD: 07/30/2007  DT: 07/30/2007  Job #: [id number]
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
## 2010-08-03 NOTE — Assessment & Plan Note (Signed)
[facility]                            CARDIOLOGY OFFICE NOTE   NAME:[name], [name]              MRN:          [id number]  DATE:05/14/2008                            DOB:          [birth year]    [name] is feeling well today.  He is here for followup.  I will  follow him for his hyperlipidemia, hypertension, coronary artery  disease, left ventricular dysfunction, atrial fibrillation, volume  overload, and mitral regurgitation.  He was last seen in the office by  [name] on April 03, 2008.  Analysis of his ICD biventricular pacer  revealed that his OptiVol showed that his volume was increased.  He did  not feel well that day.  [name] increased his Lasix for a period of  time.  He did diurese.  OptiVol measurement today reveals that he has  had some variation over time.  He is currently within an acceptable  range, but headed were slightly higher volume at this time.  [name]  does not appear to have significant changes in his edema with this.  I  have encouraged him to carefully weigh himself each day.  He is getting  ready to travel to [place].  I have had a long and careful discussion  with the patient and his wife about careful attention to his salt and  fluid intake during this trip.   He is not having any chest pain.  He is not having any significant  shortness of breath.  His biV-ICD is interrogated today.  He is in  biventricular pacing in the range of 64% at the time.  He does have  increased underlying rates that are fast enough at times that he does  not biventricular pace optimally.  We need to slow his rate down some.   PAST MEDICAL HISTORY:   ALLERGIES:  PENICILLIN.   MEDICATIONS:  See the flow sheet.  Most recently, he has been on  furosemide 40 mg a day 5 days a week and 40 b.i.d. 2 days a week.  We  will increase this slightly 40 b.i.d. 3 days a week.   OTHER MEDICAL PROBLEMS:  See the complete list in the  past medical  history section in EMR.  See the list below also.   REVIEW OF SYSTEMS:  As of today, he is not having any significant GI or  GU symptoms.  He is not having any headaches, fevers, or chills.  He has  some mild lower back pain and he will receive an injection.  His  Coumadin will be held for this and then restart it, and he may even  receive Lovenox injection before he travels to be sure that his  anticoagulation is adequate.  Otherwise, his review of systems is  negative.   PHYSICAL EXAMINATION:  VITAL SIGNS:  Blood pressure today is elevated.  It seems to vary over time.  When in the office most recently, his  pressure was controlled.  Weight is 237 pounds, which is a useful weight  for him, although up slightly.  He is here with his wife today.  HEENT:  No xanthelasma.  He has normal extraocular motion.  SKIN:  The discoloration of his skin from the amiodarone appears to be  slightly less marked, although it is during the winter where when he  gets much less sun.  NECK:  No bruits.  There is no jugular venous distention.  LUNGS:  Clear.  Respiratory effort is not labored.  CARDIAC:  S1 with an S2 and a soft systolic murmur.  ABDOMEN:  Protuberant as always, but not tense.  EXTREMITIES:  He may have trace peripheral edema.   EKG is done and reviewed completely by me.  The underlying rhythm is  paced.  There is atrial flutter also.   PROBLEM LIST:  The complete problem list is present on the past medical  history in EMR.  #1.  Hypertension:  Today his pressure is higher than it has been.  We  will arrange for him to increase his carvedilol.  He will have a  followup blood pressure check to be sure that there has not been any  significant change.  Also, his Lasix dose is going to be increased  slightly.  His blood pressure may be significantly volume dependent.  #2.  Coronary artery disease, stable.  #3.  Atrial flutter.  He is conducting slightly faster than we  would  like and not pacing from a biventricular mode enough.  I will increase  his carvedilol.  I have considered adding digoxin, but I am very  hesitant to do this shortly before he goes on a long trip.  #4.  Coumadin therapy.  This will be held for his back injection and  then restart it.  #5.  Ejection fraction 30-40%.  He is on all of the appropriate  medications.  #6.  Cardiopulmonary exercise in April 2009 revealing that he had  chronotropic incompetence and this led to his current biventricular  implantable cardioverter-defibrillator.   We will increase his carvedilol.  He will have a followup blood pressure  check and his Lasix dose will be increased mildly.      [name] Abed, MD, [facility]  Electronically Signed    JDK/MedQ  DD: 05/14/2008  DT: 05/14/2008  Job #: [id number]

## 2010-08-03 NOTE — Assessment & Plan Note (Signed)
He denies anginal symptoms. He will continue his current medications. 

## 2010-08-03 NOTE — Assessment & Plan Note (Signed)
[facility]                         ELECTROPHYSIOLOGY OFFICE NOTE   NAME:[name], [name]              MRN:          [id number]  DATE:09/05/2007                            DOB:          October 02, 1940    [name] returns today for followup.  He is a very pleasant man with  a history of now persistent atrial fibrillation despite amiodarone  therapy, he has a history of ischemic cardiomyopathy and congestive  heart failure and left bundle branch block and is status post ICD  implantation.  He returns today for followup.  The patient has been  better.  When I saw him last several weeks ago, we spent a fair amount  of time reprogramming his device, and at that time, increased his rate  response and adjusted his RV-LV time interval.  With this, the patient  has been better.  He also had an injection in his back to help for his  lower back pain.  He returns today for followup.  He does not, however,  note that over the last 3 days until today, his dyspnea was worse.  He  denies dietary noncompliance.   MEDICATIONS:  1. Benazepril 40 a day.  2. Vytorin 10/20 daily.  3. Warfarin as directed.  4. Potassium 20 mEq daily.  5. Aspirin 81 a day.  6. Carvedilol 6.25 twice daily.  7. Amiodarone 200 twice daily.  8. Furosemide 40 mg between 1 and 3 tablets daily.  9. Metformin 500 twice daily.  10.Glimepiride 2 mg half tablet daily.   PHYSICAL EXAMINATION:  GENERAL:  He is a pleasant, middle-aged man, in  no acute distress.  VITAL SIGNS:  Blood pressure was 122/96, the pulse 60 and regular,  respirations were 18, and weight was 230 pounds.  NECK:  No jugular venous distention.  LUNGS:  Clear bilaterally to auscultation.  No wheezes, rales, or  rhonchi are present.  CARDIOVASCULAR:  Regular rate and rhythm.  Normal S1 and S2.  EXTREMITIES:  No edema.   EKG demonstrates atrial fibrillation with biventricular pacing.   IMPRESSION:  1. Ischemic  cardiomyopathy.  2. Congestive heart failure.  3. Status post biventricular implantable cardioverter-defibrillator      secondary all of the above and left bundle branch block.  4. Persistent atrial fibrillation.  5. Dietary noncompliance.   DISCUSSION:  Despite [name] denial of sodium indiscretion, when I  carefully talked him about his diet, he notes that he eats out several  times per week.  Today, after his clinic, he was planning to go to  [employer] for lunch.  I have informed him of the increased sodium  intake in fast-food in all of these other restaurants and asked that he  try to eat at home.  We will plan to see the patient back in the office  in several months.  Ultimately, we may ultimately want to stop his  amiodarone and use a strategy of rate control for his atrial  fibrillation.     Doylene Canning. Ladona Ridgel, MD  Electronically Signed    GWT/MedQ  DD: 09/05/2007  DT: 09/06/2007  Job #: [id number]

## 2010-08-03 NOTE — Assessment & Plan Note (Signed)
[facility]                            CARDIOLOGY OFFICE NOTE   NAME:[name], [name]              MRN:          [id number]  DATE:05/28/2007                            DOB:          November 29, 1940    HISTORY OF PRESENT ILLNESS:  Mr. Chisolm is here for follow-up.  I had seen him last on April 19, 2007.  He was seen by Dr. Ladona Ridgel on May 08, 2007.  There was  adjustment made to his parameters.  He is now seen for follow-up.  Unfortunately, Mr. Engelmann continues to have significant shortness of  breath.  It occurs with almost any type of movement.  He has not had  syncope or presyncope.  He is not having any chest pain.   PAST MEDICAL HISTORY:   ALLERGIES:  PENICILLIN.   MEDICATIONS:  Glimepiride, benazepril, Vytorin, Coumadin, furosemide,  metformin, potassium, aspirin, carvedilol, amiodarone.   OTHER MEDICAL PROBLEMS:  See the extensive list on my note of February 28, 2007.   REVIEW OF SYSTEMS:  Other than his shortness of breath, his review of  systems is negative.   PHYSICAL EXAMINATION:  VITAL SIGNS:  Weight is 235 pounds which is down  a few pounds.  Blood pressure is 130/87.  His pulse is 79.  GENERAL:  The patient is oriented to person, time and place.  Affect is  normal.  He is here with his wife today.  HEENT:  Reveals no xanthelasma.  He has normal extraocular motion.  There are no carotid bruits.  There is no jugular venous distention.  LUNGS:  Clear.  Respiratory effort at rest is not labored.  CARDIAC:  Reveals S1-S2.  ABDOMEN:  Protuberant.  He has no significant peripheral edema.   STUDIES:  Interrogation of his ICD will be on over the next few minutes  and then Dr. Ladona Ridgel will comment on his overall status.  I will dictate  an addendum concerning this.   From the viewpoint of his overall cardiac problems.  I will not be  making any medication changes unless there is further data.   I do not think that he has  ever had the cardiopulmonary exercise test.  I do know that he has had pulmonary function studies.  I will wait for  further data from Dr. Ladona Ridgel and then we will decide of any further  testing.     [name], MD, [facility]  Electronically Signed    JDK/MedQ  DD: 05/28/2007  DT: 05/29/2007  Job #: [id number]

## 2010-08-03 NOTE — Assessment & Plan Note (Signed)
[facility]                            CARDIOLOGY OFFICE NOTE   NAME:Dimalanta, STYLIANOS STRADLING              MRN:          [id number]  DATE:05/28/2007                            DOB:          1940-05-30    ADDENDUM:  Mr. Cherian ICD was interrogated.  He was also seen by Dr.  Ladona Ridgel.  We re-reviewed his pulmonary function studies that had been  done in July of 2008.  Because these studies looked relatively good and  because he has been on amiodarone for a long period of time, we decided  not to repeat pulmonary function studies at this point.  It seems  unlikely that he would have new significant change related to his  amiodarone, as he has been on it for a long period of time.  This is not  impossible.   After further consideration, it was felt that a cardiopulmonary exercise  test may help [place] determine the bases of his shortness of breath.  On his  ICD interrogation, he is in sinus rhythm.  His unit is working well.  His OptiVol reveals that his volume status is good.   Therefore, no change in his medications.  He will have a cardiopulmonary  exercise test.  I will then see him back for followup, and I will work  with Dr. Ladona Ridgel concerning the next steps.     [name], MD, [facility]  Electronically Signed    JDK/MedQ  DD: 05/28/2007  DT: 05/29/2007  Job #: [id number]

## 2010-08-03 NOTE — Consult Note (Signed)
VASCULAR SURGERY CONSULTATION   [name], [name]  DOB:  06/08/1940                                       07/12/2007  ZOXWR#:[id number]   PRIMARY CARE PHYSICIAN:  Dr. [name].   CARDIOLOGIST:  Dr. [name].   CHIEF COMPLAINT:  Right arm swelling.   HISTORY:  The patient is a 70 year old gentleman with multiple medical  problems including coronary artery disease, type 2 diabetes, COPD,  hyperlipidemia, and a history of abdominal aortic aneurysm repair.   He presents at this time with complaints of several months of swelling  in his right hand.  This is noted to be worse in the morning and  improves during the day.  It is not associated with pain.  He denies any  recent IV in his right arm.  He did have a defibrillator placed via the  right subclavian vein in January of this year.  He denies any increased  swelling since that time.   PAST MEDICAL HISTORY:  1. Type 2 diabetes.  2. Hypertension.  3. Coronary artery disease.  4. Arrhythmias status post defibrillator placement.  5. Hyperlipidemia.  6. History of a ruptured cervical disk.   PAST SURGICAL HISTORY:  Coronary bypass 2004, abdominal aortic aneurysm  repair 2004, cervical disk surgery, pilonidal cyst, varicocele.   MEDICATIONS:  Glimepiride 1 mg b.i.d., benazepril 40 mg daily, Vytorin  10/20 daily, warfarin 2.5 mg daily, Lasix 40 mg daily, metformin 500 mg  q. a.m. 1000 mg q.p.m. Klor-Con 20 one tablet daily, amiodarone 400 mg  daily, Carvedilol 6.25 mg b.i.d. and aspirin 81 mg daily.   ALLERGIES:  None known.   SOCIAL HISTORY:  The patient is married with one child.  He is retired.  Does not smoke.  Discontinued tobacco use approximately 15 years ago.  Does not work.  Has three to four beers daily.   REVIEW OF SYSTEMS:  Refer to patient encounter form.  This was reviewed  today.  The patient denies any shortness of breath with exertion and orthopnea.  He has pain in his legs  with walking.  A history of blood clots.  Chronic arthritis.   PHYSICAL EXAMINATION:  Generally well-appearing 70 year old male.  Alert, oriented.  No acute distress.  Vital Signs:  BP 138/86, pulse 75  per minute, respirations 18 per minute, O2 sat 98%.  Upper extremities,  there is 1 to 2+ edema on the right hand.  No joint swelling.  No joint  tenderness.  No erythema.  Full range of motion bilaterally.   INVESTIGATIONS:  Upper extremity venous duplex evaluation reveals no  evidence of deep venous thrombosis of the right upper extremity.  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   IMPRESSION:  1. Right upper extremity swelling likely secondary to chronic      superficial thrombophlebitis and possible proximal venous stenosis      related to transvenous defibrillator placement.  2. Type 2 diabetes.  3. Hypertension.  4. Coronary artery disease.  5. Hyperlipidemia.  6. Chronic back pain.  7. AICD.   RECOMMENDATIONS:  The patient has minimal symptoms related to this mild  to moderate right upper extremity edema.  No evidence of acute DVT.  Continue Coumadin therapy.  Arm elevation p.r.n.  If swelling should  become more difficult to manage, may  require fitting with an upper  extremity compression device.  The patient declines this at the present  time.  Return p.r.n.   Balinda Quails, M.D.  Electronically Signed  PGH/MEDQ  D:  07/12/2007  T:  07/13/2007  Job:  [id number]   cc:   Peyton Najjar, MD  [name], MD, [facility]  Almedia Balls. Ranell Patrick, M.D.

## 2010-08-03 NOTE — Assessment & Plan Note (Signed)
[facility]                            CARDIOLOGY OFFICE NOTE   NAME:[name]              MRN:          [id number]  DATE:08/23/2006                            DOB:          November 22, 1940    I saw [name] last Aug 02, 2006.  He had diuresed significantly.  We  had increased his diuretics, and we had cut back his salt and fluid  intake and his beer intake.  At that time he was looking better.  BUN  and creatinine were checked, and this showed that he had an increase in  his BUN to 35 and creatinine up to 2.2, and his Lasix dose was cut back  to 40 once a day.  Since then he had one day with significant increased  swelling, and he and his wife took two doses of Lasix and then returned  him back to one daily.  Since then his fluid status appears to be  stable.  His weight is the same today.  Despite this he has new  exertional shortness of breath.  He is not having PND or orthopnea.  He  is not having chest pain.  It is of note also that since I saw him last,  he has converted to sinus rhythm.  Despite being in sinus rhythm he  appears to have this new problem of shortness of breath.  Etiology is  not clear at this point.   ALLERGIES:  PENICILLIN.   MEDICATIONS:  1. Aspirin 81.  2. Metoprolol 25 b.i.d.  3. Diltiazem ER 180 b.i.d.  4. Vytorin 10/20.  5. Coumadin.  6. Furosemide 40 once a day.  7. Metformin.  8. Potassium 20 daily.  9. Amiodarone I will recheck, but I am pretty sure we have him up at      400 mg daily.  10.Benazepril 40.  11.Amaryl.   OTHER MEDICAL PROBLEMS:  See the list below.   REVIEW OF SYSTEMS:  Other than his shortness of breath he has no other  significant complaints.  Review of Systems, otherwise, is negative.   PHYSICAL EXAMINATION:  VITAL SIGNS:  Weight 237 pounds.  This is similar  to his weight when I saw him previously.  This seems to correlate with  his lack of edema.  Blood pressure 136/92,  pulse 80.  SKIN:  Darkly tanned and he has additional skin changes related to the  use of the amiodarone in combination with the sun.  GENERAL:  The patient is oriented to person, time, and place.  Affect is  normal.  HEENT:  He has no xanthelasma.  There is normal extraocular motion.  NECK:  He has no carotid bruits.  LUNGS:  Clear.  Respiratory effort is not labored.  CARDIAC:  S1, S2.  There are no clicks or significant murmurs.  ABDOMEN:  Protuberant but soft.  He has no significant peripheral edema  today.   EKG shows that he is in normal sinus rhythm.   PROBLEM LIST:  Problems are listed extensively on my list of June 21, 2006.  1. PENICILLIN allergy.  2. Hypertension.  His blood pressure is  nicely controlled.  3. History of pilonidal cyst.  4. Low back pain.  Since his procedure done by Dr. Danielle Dess his back      pain is much better.  5. Atrial fibrillation.  We had tried to lower his amiodarone dose      slightly, and he had return of atrial fibrillation.  He is now back      on 400 mg daily, and he has just spontaneously converted since his      last visit back to sinus rhythm.  6. History of atrial clot.  7. Shortness of breath.*  This seems to be worse since his last visit,      and the exact etiology is not clear to me.  It does not appear to      be due to volume overload.  We will obtain a chest x-ray, and also      we will have a 2D echocardiogram and a Myoview scan to be sure that      there has not been a major change in LV function and to be sure      that this is not ischemic.  8. History of some claudication.  I see the description of the      Dopplers in my note of July 18, 2006.  He needs peripheral      vascular evaluation at some point, but he has no symptoms at this      time.  9. Coronary disease post CABG in 2004.  It is time for a Myoview scan.  10.Status post wedge resection of the left upper lung with the benign      lesion.  11.Diabetes.  12.Some  limited wide complex tachycardia.  13.Coumadin therapy.  14.Ejection fraction that had normalized over time and now it is time      for followup echocardiogram.  15.History of gout.  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  17.Mild mitral regurgitation.  18.Status post abdominal aortic aneurysm repair by Dr. Liliane Bade in      the past.  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  20.History of chronic nasal drainage.  21.Status post significant injury to his right leg at work.  22.Easy bruisability.  We try to keep his INRs as low as possible and      therapeutic.  23.TSH that was slightly low in 2005.  This was rechecked recently,      and it was low normal at 0.68.  I will not repeat it today, but it      will be kept in mind.  24.Leg weakness.  We feel that this is not primarily vascular.  25.Increased edema.  With the adjustment of his diuretics, this had      improved.  However, he had an increase in BUN and creatinine, and      he is now back to Lasix 40, but his fluid status appears to be      stable.  BMET will be rechecked today.  26.Excess beer consumption.  We have talked about this, and he is      cutting back.  27.Renal status.  We will check a BMET today.   The patient will have a BMET, chest x-ray, 2D echocardiogram, and a  nuclear scan, and we will see him for followup.     [name], MD, [facility]  Electronically Signed    JDK/MedQ  DD: 08/23/2006  DT: 08/23/2006  Job #: [id number]

## 2010-08-06 NOTE — Discharge Summary (Signed)
NAME:  [name], [name]                       ACCOUNT NO.:  [id number]   MEDICAL RECORD NO.:  [id number]                   PATIENT TYPE:  OIB   LOCATION:  3735                                 FACILITY:  [facility]   PHYSICIAN:  Jesse Sans. Wall, M.D.                DATE OF BIRTH:  1940-11-06   DATE OF ADMISSION:  07/25/2002  DATE OF DISCHARGE:  07/27/2002                           DISCHARGE SUMMARY - REFERRING   DISCHARGE DIAGNOSES:  1. Three vessel coronary artery disease.  2. LV dysfunction with an ejection fraction of 35% and 2+ mitral     regurgitation.  3. Atherosclerotic peripheral vascular disease.     a. 6.8 cm abdominal aortic aneurysm.     b. Decreased ABIs left greater than right.  4. History of paroxysmal atrial fibrillation.     a. Status post DC cardioversion to normal sinus rhythm January 2004.  5. History of left atrial clot by TEE.     a. Chronic Coumadin therapy.  6. History of tobacco abuse.  7. Hyperlipidemia.  8. Hypertension.  9. Adult-onset diabetes mellitus (hemoglobin A1C 8 this admission.   PROCEDURES PERFORMED:  1. Cardiac catheterization by Veneda Melter, M.D. on Jul 26, 2002 revealing     three vessel coronary artery disease.  Please see his dictated note for     complete details.  2. Aortogram with bilateral peripheral __________  abdominal aorta by Balinda Quails, M.D.   HOSPITAL COURSE:  Please see the dictated note by Jesse Sans. Wall, M.D. for  complete details.  Briefly, this 70 year old male came to [facility] on Jul 25, 2002 for abdominal aortogram to evaluate his abdominal  aortic aneurysm.  Post procedure he developed chest pain.  We were called to  see the patient.  His cardiac enzymes were negative for myocardial  infarction.  Given his previous history of coronary artery disease he was  set up for cardiac catheterization.  Catheterization was performed on Jul 26, 2002 by Veneda Melter, M.D.  Please see above for results.  Given the  patient's three vessel disease, consideration was made for coronary artery  bypass grafting.  The patient would need coordination between his primary  cardiologist, [name], M.D. and Dr. Madilyn Fireman and the cardiac surgeons to  decide on the timing and procedure for bypass and AAA repair.  Charlton Haws,  M.D. saw the patient on Jul 27, 2002 and thought he was stable enough for  discharge to home.  He will need close follow-up next week in our office.   Upon laboratory review at discharge the patient's hemoglobin A1C was noted  to be elevated this admission at 8.1.  The patient noted that he was a  borderline diabetic in the past.  He was instructed to follow up with  Titus Dubin. Alwyn Ren, M.D. [facility] as soon as possible as an outpatient to initiate  therapy.  Outpatient  diabetes education was ordered at the time of  discharge.   LABORATORIES:  Cardiac enzymes as noted above.  White count 7600, hemoglobin  14.2, hematocrit 41.6, platelet count 171,000.  Sodium 141, potassium 3.6,  chloride 104, CO2 28, glucose 138, BUN 8, creatinine 0.9.  Total protein  5.8, albumin 3.4, AST 25, ALT 25, alkaline phosphatase 56, total bilirubin  0.9.  Digoxin level 0.2.  TSH 0.355.  On May 7 INR 1.4.  Total cholesterol  182, triglycerides 192, HDL 54, LDL 90.  Homocysteine level 10.84.   DISCHARGE MEDICATIONS:  1. Aspirin 325 mg daily to be decreased to 81 mg daily on Monday.  2. Metoprolol 75 mg b.i.d.  3. Lanoxin 0.125 mg daily.  4. Mavik 4 mg daily.  5. Norvasc 10 mg daily.  6. Protonix 40 mg daily.  7. Zocor 20 mg q.h.s.  8. Furosemide 40 mg daily.  9. Catapres 0.2 mg b.i.d.  10.      Coumadin 5 mg daily except one-half tablet on Mondays and Fridays.  11.      Imdur 60 mg daily.  12.      Indomethacin p.r.n.  13.      Nitroglycerin p.r.n. chest pain.   PAIN MANAGEMENT:  Tylenol as needed.  Nitroglycerin as needed for chest  pain.  Call 911 with more chest pain.   ACTIVITY:  No driving, heavy lifting,  exertion, or work for three days.  The  patient is to take it easy over the next several weeks as he awaits his  upcoming procedures.   DIET:  Low salt, low fat.   WOUND CARE:  The patient to call our office in [facility] for any groin  swelling, bleeding, or bruising.   FOLLOW UP:  1. The patient is to follow up with his primary care physician, Titus Dubin.     Alwyn Ren, M.D. [facility] in one to two weeks.  He should call for an appointment.     This appointment should address his diabetes of new onset and possible     initiation of therapy.  2.     The patient to see [name], M.D. next week and office should call him     with appointment.  3. The patient should follow up in the [facility] next week.  He     already has an appointment on Friday and his INR can be checked on this     date.     Tereso Newcomer, P.A.                        Thomas C. Wall, M.D.    SW/MEDQ  D:  07/27/2002  T:  07/29/2002  Job:  [id number]   cc:   Balinda Quails, M.D.  [address]  [place]  [place] [address]  Fax: [phone]   [name], M.D.   Veneda Melter, M.D.   Titus Dubin. Alwyn Ren, M.D. [facility]

## 2010-08-06 NOTE — Assessment & Plan Note (Signed)
[facility]                            CARDIOLOGY OFFICE NOTE   NAME:Osland, KAIL FRALEY              MRN:          [id number]  DATE:03/29/2006                            DOB:          1940/09/12    Mr. Rendell is doing well.  He has had excellent response to the  procedures done by Dr. Danielle Dess and his back pain is much better.  He  continues on 300 mg of amiodarone.  He is not having palpitations.  His  rhythm is stable today.  I still plan to keep him on amiodarone at 300  mg.  Note:  We had changed some of his medicines recently to try to  change him to generics.  His blood pressure is remaining stable and he  is tolerating the medicine changes.   PAST MEDICAL HISTORY:   ALLERGIES:  PENICILLIN.   MEDICATIONS:  1. Aspirin 81.  2. Metoprolol 25 b.i.d.  3. Diltiazem ER 180 b.i.d.  4. Vytorin 10/20.  5. Coumadin.  6. Furosemide 40.  7. Metformin.  8. Potassium 20.  9. Amiodarone 300.  10.Benazepril 40.   OTHER MEDICAL PROBLEMS:  See the extensive list on my note of September 06, 2005.   REVIEW OF SYSTEMS:  He is doing well and today has no significant  complaint.   PHYSICAL EXAMINATION:  Blood pressure is 145/80 with a pulse of 58.  His  weight is 237 pounds.  This is a down of 2 pounds since his last visit.  He needs to continue to lose weight.  He is oriented to person, place  and time.  Affect is normal.  HEENT:  Reveals no xanthelasma.  He has normal extraocular motion.  SKIN:  His skin has the classic grayish tinge of amiodarone therapy. He  has no carotid bruits.  There is no jugular venous distention.  CARDIAC EXAM:  Reveals an S1 with an S2.  There are no clicks or  significant murmurs.  ABDOMEN:  His abdomen is protuberant, but soft.  He has no significant  peripheral edema.   EKG reveals that he is holding sinus rhythm.  He has nonspecific  interventricular conduction delay that we have seen before.   PROBLEMS:  Are listed  extensively on my note of September 06, 2005.   1. Hypertension:  He is under good control at this time.  2. Atrial fibrillation:  He is holding sinus.   I will see him in three months.  At that time, we will see if we can  lower him to 250 mg of amiodarone daily.     [name], MD, [facility]  Electronically Signed    JDK/MedQ  DD: 03/29/2006  DT: 03/29/2006  Job #: [id number]

## 2010-08-06 NOTE — Assessment & Plan Note (Signed)
[facility]                              CARDIOLOGY OFFICE NOTE   NAME:[name], [name]              MRN:          [id number]  DATE:09/30/2005                            DOB:          1940/12/23    [name] continues to be in atrial fib.  We have reloaded him with a  higher dose of amiodarone and it is now time to proceed with cardioversion  when we are sure that his anticoagulation has been more than adequate for at  least three weeks.  Several weeks ago his INR had drifted down.   He has had fatigue.  He has not had any significant chest pain.  I feel it  is important to try to get him back into sinus rhythm.   PAST MEDICAL HISTORY:   ALLERGIES:  PENICILLIN.   MEDICATIONS:  1.  Aspirin 81.  2.  Lanoxin 0.125.  3.  Amaryl 1 mg b.i.d.  4.  Metoprolol 25 b.i.d.  5.  Mavik 4.  6.  Diltiazem ER 180 b.i.d.  7.  Vytorin 10/20.  8.  Coumadin as directed.  9.  Furosemide 40.  10. Metformin.  11. Potassium and amiodarone 400 mg daily.   OTHER MEDICAL PROBLEMS:  See the complete list on my note of September 06, 2005.   REVIEW OF SYSTEMS:  Other than some fatigue he is feeling relatively well  and his review of systems is negative.   PHYSICAL EXAMINATION:  VITAL SIGNS:  Blood pressure is 144/60 with a pulse  of 45.  GENERAL:  The patient is oriented to person, time, and place and his affect  is normal.  He is here with his wife today.  LUNGS:  Clear.  Respiratory effort is not labored.  HEENT:  No xanthelasma.  He has normal extraocular motion.  CARDIAC:  S1 with an S2.  There are no clicks or significant murmurs.  ABDOMEN:  Protuberant, but soft.  He has no significant peripheral edema  today.   EKG reveals his underlying wide complex beat and slow atrial fibrillation.   Problems include the complete list on my note of September 06, 2005.  1.  Hypertension relatively controlled today.  2.  Atrial fibrillation.  He is now reloaded with  amiodarone and as soon as      we are sure that his Coumadin is stable for another week or week and a      half we will proceed with cardioversion probably on the 24th of July.  3.  History of left atrial clot.  Patient had a clot in the past.  He was      then fully anticoagulated and he was cardioverted without a repeat      transesophageal echocardiogram.  As we are doing the same this time he      can be converted this time without transesophageal echocardiogram.  4.  Coronary disease, stable.  5.  Coumadin therapy.  See the note above.   Careful planning for cardioversion as outlined above.  [name] Abed, MD, [facility]    JDK/MedQ  DD:  09/30/2005  DT:  09/30/2005  Job #:  [id number]

## 2010-08-06 NOTE — Op Note (Signed)
   NAME:  [name], [name]                       ACCOUNT NO.:  [id number]   MEDICAL RECORD NO.:  [id number]                   PATIENT TYPE:  INP   LOCATION:  2869                                 FACILITY:  [facility]   PHYSICIAN:  Balinda Quails, M.D.                 DATE OF BIRTH:  1941/01/12   DATE OF PROCEDURE:  10/08/2002  DATE OF DISCHARGE:                                 OPERATIVE REPORT   SURGEON:  Balinda Quails, M.D.   ASSISTANT:  Nurse.   ANESTHESIA:  General with LMA, anesthesiologist Sheldon Silvan, M.D.   PREOPERATIVE DIAGNOSIS:  Abscess, right thigh.   POSTOPERATIVE DIAGNOSIS:  Absent, right thigh.   PROCEDURE:  Incision and drainage abscess right thigh.   INDICATIONS FOR PROCEDURE:  [name] is a 70 year old male with a history  of coronary artery disease and a large abdominal aortic aneurysm. He  underwent  coronary artery bypass carried out by Dr. Laneta Simmers on September 06, 2002, with endovascular vein harvest of the saphenous vein from the right  leg. He recovered from this surgery well and was referred back for  evaluation  of a large abdominal aortic aneurysm.   He was  initially scheduled to undergo elective repair of the abdominal  aortic aneurysm today. He however, developed a tender erythematous swelling  at the endovascular vein harvest site on his right thigh. Evaluation  in the  office yesterday revealed  findings consistent with an abscess. This was  initially aspirated and he was scheduled for incision and drainage today.   DESCRIPTION OF PROCEDURE:  The patient was brought to the operating room in  stable condition. He was placed under general endotracheal anesthesia with  LMA. The right leg was prepped and draped in a sterile fashion.   An incision was made directly over the vein harvest site in the distal right  thigh. Dissection was carried down into an infected hematoma cavity. This  was cultured for aerobic and anaerobic bacteria. The cavity was  irrigated  fully with antibiotic solution. The wound was packed with saline soaked  Kerlix. Dry Kerlix and an Ace wrap were applied.   The patient tolerated the procedure well. He was transferred to the recovery  room in stable condition with no apparent interoperative complications.                                                Balinda Quails, M.D.   PGH/MEDQ  D:  10/08/2002  T:  10/08/2002  Job:  [id number]   cc:   [name], M.D.

## 2010-08-06 NOTE — Assessment & Plan Note (Signed)
[place] [facility]                              CARDIOLOGY OFFICE NOTE   NAME:Gasaway, ADRIANO BISCHOF              MRN:          [id number]  DATE:12/20/2005                            DOB:          10/24/1940    [name] is seen for followup today.  From a cardiac viewpoint, he is  doing well.  He had back surgery by Dr. Barnett Abu.  He has had some  continued back discomfort.  He is seeing Dr. Danielle Dess later today or tomorrow.  I suspect this is just part of his recovery.  He and his wife are here  today, and he has not had any chest pain or shortness of breath.  There has  been no syncope or presyncope.   PAST MEDICAL HISTORY:   ALLERGIES:  PENICILLIN.   MEDICATIONS:  Aspirin, Amaryl, metoprolol, Mavik, diltiazem, Vytorin,  Coumadin, furosemide, Metformin, potassium, and amiodarone 400 mg daily.   OTHER MEDICAL PROBLEMS:  See the extensive note on September 06, 2005.   REVIEW OF SYSTEMS:  Other than some continuing recovery back pain, his  review of systems is negative.   PHYSICAL EXAMINATION:  GENERAL:  The patient is well nourished and well  kept.  He is oriented to person, time, and place and his affect is normal.  HEENT:  Reveals no xanthelasma.  He has normal extraocular motion.  There  are no carotid bruits.  There is no jugular venous distention.  LUNGS:  Clear.  Respiratory effort is not labored.  CARDIAC:  Reveals an S1 with an S2.  There are no clicks or significant  murmurs.  ABDOMEN:  Protuberant but soft.  He has no masses or bruits.  EXTREMITIES:  He has only trace peripheral edema.  In fact, his right leg is  better than usual.  VITAL SIGNS:  Include blood pressure today of 120/68 which is quite good for  him.  His pulse rate is 45 and regular.  Weight is 249 pounds.   Problems are listed with the extensive note of September 06, 2005.   PROBLEM LIST:  1. Hypertension, nicely controlled.  2. Low back pain, post surgery by Dr. Danielle Dess.   He will see Dr. Danielle Dess      today.  3. Atrial fibrillation.  He has been converted back to sinus.  He is      holding by regular rate on physical exam.  Amiodarone will be decreased      from 400 alternating with 300, leading to a dose of 350 mg daily.  4. Coronary disease, stable.  5. Following his TSH.  He needs a followup TSH when we see him next time.   He is doing well.            ______________________________  [name], MD, [facility]     JDK/MedQ  DD:  12/20/2005  DT:  12/21/2005  Job #:  [id number]

## 2010-08-06 NOTE — Discharge Summary (Signed)
NAME:  [name], [name] [name] NO.:  [id number]   MEDICAL RECORD NO.:  [id number]                   PATIENT TYPE:  OIB   LOCATION:  6531                                 FACILITY:  [facility]   PHYSICIAN:  [name] [name] [name], M.D.                   DATE OF BIRTH:  1940-10-13   DATE OF ADMISSION:  08/08/2002  DATE OF DISCHARGE:  08/09/2002                           DISCHARGE SUMMARY - REFERRING   SUMMARY OF HISTORY:  [name] [name] [name] is a 70 year old white male who has been  extensively worked up by Dr. Madilyn [name] in regards to his peripheral vascular  disease and abdominal aortic aneurysm.  Postangiogram, he had some chest  discomfort and a cardiac catheterization was recommended.  This was  performed on Jul 26, 2002 and revealed coronary artery disease.  His  circumflex was especially suspicious for ischemia.  It was felt that he  should come back in an undergo intervention on his circumflex lesion.  During that admission, it was also noted that he received a new diagnosis of  adult onset diabetes with an elevated hemoglobin A1c of 8.1.  Diabetes  management consulted the patient on diet and outpatient followup.  He has  not done this as of yet.  He also has a history of hyperlipidemia, PAF, and  left atrial thrombus.  He was on Coumadin prior to these evaluations and  plans are for him to continue on Plavix and aspirin with his upcoming  surgeries.  He also has a history of hypertension, chronic obstructive  pulmonary disease, three-vessel coronary artery disease with an EF of 35%,  2+ MR, gout, obesity, remote tobacco use, alcohol use.   Preadmission laboratory data showed an H&H of 13.4 and 40.5, normal indices.  Platelets 214, WBC 7.6, PTT 28.2, PT 14.1.  INR of 1.5, sodium 141,  potassium 3.9, BUN 12, creatinine 1.1, glucose 159.  EKG:  Sinus  bradycardia, normal axis, nonspecific ST-T wave changes.   HOSPITAL COURSE:  [name] was brought in for outpatient cardiac  catheterization.  This was performed on May 20 by Dr. Chales [name].  Dr. Chales [name]  confirmed a 70% circumflex lesion.  This was reduced to 0% with an Express 2  stent.  It was also noted that he had a 100% OM-2 that filled the right RCA.  The RCA had diffuse 70% mid lesion and a 6% distal lesion.  Dr. Chales [name] felt  that he should have an adenosine Cardiolite on his medications next week to  further evaluate the RCA lesion.  According to the nurse's report  postprocedure, the patient was hypotensive.  However, actual documentation  of blood pressures is not available and his medications were held.  On the  morning of the 21st, his blood pressure is 160/84.  Catheterization site was  intact and after review Dr. Chales [name] felt that he could be discharged home.  On  the 21st, his H&H was  15.0 and  44.4, platelets 193, WBC 7.0, sodium 141,  potassium 3.8, BUN 8, creatinine 0.9, glucose 189.  It was also reported  just prior to discharge he had an episode of an eight beat run of  nonsustained ventricular tachycardia which he has a history of.   DISCHARGE DIAGNOSES:  1. Unstable angina.  2. Three-vessel coronary artery disease, status post angioplasty stenting     utilizing an Express stent to the circumflex. Residual coronary artery     disease as described.  3. Nonsustained ventricular tachycardia.  4. Hyperglycemia with a recent diagnosis of diabetes.  However, remains     untreated at this time.  5. Hyperlipidemia with elevated triglycerides on May 7.  6. History as previously described.   DISPOSITION:  He is discharged home on Plavix 75 mg daily, coated aspirin  325 daily.  He was asked to continue digoxin 0.125 daily, Imdur 60 daily,  metoprolol 75 b.i.d., Protonix 20 daily, Zocor 20 q.h.s., Clonidine 0.2  b.i.d., Norvasc 10 daily, Mavik 4 daily, Lasix 40 daily, and  nitroglycerin  0.04 as needed.  He is advised no lifting, driving, sexual activity or heavy  exertion for two days.  Maintain low salt,  fat, cholesterol ADA diet.  If he  has any problems with his catheterization site, he was asked to call.  He  was advised to limit himself to two beers per day and he was asked not to  forget to arrange followup with Dr. Alwyn [name] in regards to his diabetic  evaluation and the possible need for oral hypoglycemic agents.  An adenosine  Cardiolite has been set up for him on Tuesday May 25 at 11:30 a.m. He was  advised no caffeine after midnight.  He may have a clear liquid breakfast  before 6 a.m. and then nothing to eat or drink.  He was also given  permission to take his medicines with a small sip of water.  He will follow  up with Dr. Myrtis [name] on June 2 at 8:40 a.m. in regards to the Cardiolite and  further treatment of his coronary artery disease and cardiac risk factor  modification and arrangements for surgery with Dr. Madilyn [name].     [name] [name] [name], P.A. [facility]                    [name] [name] [name], M.D.    EW/MEDQ  D:  08/09/2002  T:  08/09/2002  Job:  [id number]   cc:   [name] [name] [name], M.D.  [address]  [name]  [place] [address]  Fax: [phone]   [name] [name] [name], M.D.   [name] [name] [name], M.D. [name] [name] [name]

## 2010-08-06 NOTE — Consult Note (Signed)
NAME:  [name], [name] NO.:  [id number]   MEDICAL RECORD NO.:  [id number]                   PATIENT TYPE:  INP   LOCATION:  NA                                   FACILITY:  [facility]   PHYSICIAN:  Learta Codding, M.D.                 DATE OF BIRTH:  [birth year]   DATE OF CONSULTATION:  09/10/2002  DATE OF DISCHARGE:                                   CONSULTATION   CARDIOLOGIST:  [name], M.D.   REASON FOR CONSULTATION:  Evaluation and management of postoperative atrial  fibrillation.   HISTORY OF PRESENT ILLNESS:  [name] is a 70 year old male with a  history of significant coronary artery disease.  Stress test Cardiolite on  Aug 13, 2002 showed mild to moderate ischemia and ejection fraction of 54%.  Subsequent cardiac catheterization was performed and this showed severe  triple vessel coronary artery disease.  The patient also has a 6.8 cm  abdominal aneurysm and recently had undergone aortography by Dr. Madilyn Fireman.  The  patient is now post coronary artery bypass grafting.  He has done well in  the postoperative period.  He denies any substernal chest pain, shortness of  breath, orthopnea, or PND.  Day #5 postoperatively it was noted that the  patient developed atrial fibrillation.  Of note is that the patient did have  prior history of atrial fibrillation, status post cardioversion, January of  2004.  He was not maintained on rhythm stabilizing drugs.  The patient's  heart rate was initially somewhat poorly controlled, but now with amiodarone  and digoxin and beta blockers is under improved control.  The patient is  essentially asymptomatic with his rhythm abnormality.   PAST MEDICAL HISTORY:  As outlined above.  1. History of coronary artery disease, status post coronary bypass.  2. History of abdominal aortic aneurysm.  3. History of paroxysmal atrial fibrillation with a history of left atrial     __________ by Molli Posey, status post cardioversion,  January 2004.  4. History of hyperlipidemia and hypertension.  5. History of insulin dependent adult onset diabetes mellitus.   ALLERGIES:  PENICILLIN.   MEDICATIONS:  1. Protonix 40 mg a day.  2. Simvastatin 20 mg a day.  3. Digoxin 0.125 mg p.o. daily.  4. Insulin sliding scale.  5. Furosemide 40 mg a day.  6. Amiodarone 40 mg p.o. t.i.d.  7. Aspirin 81 mg p.o. daily.  8. Warfarin 7.5 per pharmacy protocol.  9. Metformin 500 mg p.o. b.i.d.  10.      Potassium chloride.  11.      Enoxaparin 100 mg subcu q.12h.  12.      Metoprolol 25 mg p.o. b.i.d.  13.      Ultram p.r.n. pain.   FAMILY HISTORY:  Notable for stroke in mother and father had diabetes.   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  Previous smoker, quit in 1997. He drinks 2-3 beers a day.   REVIEW OF SYSTEMS:  As per HPI.  No nausea, vomiting, fever, chills,  orthopnea, PND, palpitations, or syncope.   PHYSICAL EXAMINATION:  VITAL SIGNS:  Blood pressure 130/75, heart rate is  varying between 75-89 beats per minute.  Temperature is afebrile.  GENERAL:  Well-nourished black male in no apparent distress.  HEENT:  Pupils isocoric.  NECK:  Supple.  No carotid bruits. JVD is approximately 7 cm.  There is no  abdominal jugular reflux.  LUNGS:  Clear bilaterally.  HEART:  Irregular rate and rhythm.  Soft crescendo-decrescendo murmur at the  left upper sternal border, but no S3.  PMI is nondisplaced.  ABDOMEN:  Soft, nontender.  No rebound or guarding.  Good bowel sounds.  EXTREMITIES:  There is 2+ peripheral pitting edema.  Peripheral pulse are  palpable.  NEUROLOGIC:  The patient is alert, oriented, and grossly nonfocal.   LABORATORY DATA:  INR is 1.9 today.  Hemoglobin stable at 11.2, platelet  count is 141.  Glucose 150.  Liver function tests within normal limits.   A 12 lead electrocardiogram showed atrial fibrillation, rate controlled at  85 beats per minute.  EKG  several days ago demonstrated sinus bradycardia  with nonspecific ST/T-wave change, but no acute ischemic changes.   IMPRESSION/PLAN:  1. Postoperative atrial fibrillation.  The patient is hemodynamically     stable.  He is relatively rate controlled, although when getting up, his     heart rate does increase slightly.  The patient has a history of atrial     fibrillation with prior cardioversion.  He also has a history of left     atrial thrombus.  Given the fact that the patient is early     postoperatively, I suspect that the chance of making any normal sinus     rhythm after an early postoperative cardioversion is relatively low.  I     would favor waiting 4-6 weeks and I have the patient fully loaded on     amiodarone which he should have received approximately 11-12 g.  I have     discussed with the patient and he is willing to proceed with     cardioversion in 4-6 weeks.  He also can be adequately anticoagulated and     in general will be followed in the Coumadin clinic.  Would increase     Lopressor to 25 mg p.o. t.i.d. to improve rate control and lower the     amiodarone to 400 mg a day.  The patient will be followed in the Coumadin     clinic and an elective cardioversion can then be set up in 4-6 weeks.  2. Status post coronary artery bypass grafting.  No recurrence __________.     Continue medical therapy.  3. Amiodarone toxicity monitoring.  Thyroid function test will be obtained     if not already obtained on amiodarone.    DISPOSITION:  Will notify Dr. Myrtis Ser about electrocardioversion in 4-6 weeks.  In the interim the patient can be followed in the Coumadin clinic and  anticipate that he potentially can be discharged tomorrow.  We did give the  patient a dose of Lasix tonight as he still has some peripheral edema  secondary to postoperative volume overload.  Learta Codding, M.D.    GED/MEDQ  D:  09/10/2002  T:   09/10/2002  Job:  [id number]   cc:   Evelene Croon, M.D.  [address]  [place]  [place] [address]  Fax: [phone]   [name], M.D.

## 2010-08-06 NOTE — Op Note (Signed)
NAME:  [name], [name]                       ACCOUNT NO.:  [id number]   MEDICAL RECORD NO.:  [id number]                   PATIENT TYPE:  INP   LOCATION:  2020                                 FACILITY:  [facility]   PHYSICIAN:  Evelene Croon, M.D.                  DATE OF BIRTH:  [birth year]   DATE OF PROCEDURE:  DATE OF DISCHARGE:                                 OPERATIVE REPORT   PREOPERATIVE DIAGNOSES:  1. Severe three vessel coronary artery disease.  2. Right chest subpleural nodular mass.   POSTOPERATIVE DIAGNOSES:  1. Severe three vessel coronary artery disease.  2. Right chest subpleural nodular mass.   OPERATIVE PROCEDURE:  1. Median sternotomy.  2. Extracorporeal circulation.  3. Coronary artery bypass grafting surgery times three using a left internal     mammary artery graft to the left anterior descending coronary artery,     with a saphenous vein graft to the obtuse marginal branch of the left     circumflex coronary artery and a saphenous vein graft to the posterior     descending branch of the right coronary artery.  4. Endoscopic vein harvesting from the thighs.  5. Excision of right chest subpleural mass.  6. Wedge resection of left upper lobe lung lesion.   ATTENDING SURGEON:  Evelene Croon, M.D.   ASSISTANT:  Salvatore Decent. Dorris Fetch, M.D.   SECOND ASSISTANT:  Toribio Harbour, N.P.   ANESTHESIA:  General endotracheal anesthesia.   CLINICAL HISTORY:  This is a 70 year old patient of Dr. Bud Face who  has a 6.8 cm abdominal aortic aneurysm.  He recently underwent an aortogram  by Dr. Madilyn Fireman and subsequently developed chest discomfort.  Cardiac  catheterization showed severe three vessel disease.  He subsequently  underwent percutaneous intervention on the left circumflex coronary artery  on Aug 08, 2002.  A Cardiolite scan performed after this on Aug 13, 2002  showed mild to moderate ischemia in the anterior wall. The ejection fraction  was 54%.  After  review of this, it was felt that the best treatment for this  patient would be coronary artery bypass grafting surgery prior to aneurysm  resection.  I discussed the operative procedure with the patient and his  wife including alternatives, benefits and risks including bleeding, blood  transfusion, infection, stroke, myocardial infarction and death.  They  understood and agreed to proceed.  A preoperative chest x-ray showed a  nodular subpleural mass in the right chest laterally.  I told them that I  would be able to visualize this area in the operating room and would treat  that appropriately at the time of surgery.  They were in agreement with  that.   OPERATIVE PROCEDURE:  The patient was taken to the operating room and placed  on the table in the supine position. After the induction of general  endotracheal anesthesia a Foley catheter was placed in the  bladder using  sterile technique.  Then the chest, abdomen and both lower extremities were  prepped and draped in the usual sterile manner.  The chest was entered  through a median sternotomy incision and the pericardium opened in the  midline.  Examination of the heart showed good ventricular contractility.  He had a very large heart.  The ascending aorta was mildly enlarged but  probably appropriately so for his body size.  There was no evidence of  aneurysm formation.  There was extensive atherosclerosis of the ascending  aorta with complete calcification of the aortic root portion.  There was  also some plaque palpable in the aortic arch with calcification at the  takeoff of the innominate artery.   Then the left internal mammary artery was harvested from the chest wall as a  pedicle graft.  This is a medium to large caliber vessel with excellent  blood flow through it. At the same time a segment of greater saphenous vein  was harvested from the left leg using endoscopic vein harvest technique.  This vein became quite small at the  knee level, therefore another section of  vein was harvested from the right leg using endoscopic vein harvest  technique.  This vein again became very small at the knee level.  The vein  within the thighs was somewhat thickened and varied in caliber being medium  sized near the upper thigh and of small to medium caliber in the lower  thigh. It was felt to be of sufficient quality to use for bypass conduit.   Then the right pleural space was opened. The right pleural space was  examined.  There was a subpleural mass noted against the lateral chest wall.  This was rubbery and mobile and appeared to be a lipoma.  It was completely  resected and sent to pathology for pathologic examination.  There was  complete hemostasis at that site.   In addition, there was also a hard lesion noted in the anterior segment of  the left upper lobe near the apex.  This hard lesion was surrounded by a  large bleb in this area.  This area was adhesed to the upper portion of the  chest where the mammary artery would be lying.  I felt it would be best to  remove this area in case it was something very abnormal and to prevent any  problems down the road.  This lesion was resected in a wedge shaped fashion  using a linear stapler.  It was sent to pathology for permanent section.   Then the patient was heparinized and when an adequate activated clotting  time was achieved the distal ascending aorta was cannulated using a 22  French aortic cannula for arterial in flow.  Venous outflow was achieved  using a two stage venous cannula through the right atrial appendage. An  antegrade cardioplegia and vent cannula were inserted into the aortic root.   The patient was placed on cardiopulmonary bypass and the distal coronary  arteries were identified. The LAD was a medium sized vessel in its mid  portion. There was mild distal disease in this.  The obtuse marginal was a large vessel that is diffusely diseased although was  graftable distally.  The distal left circumflex terminated as a posterolateral branch that was  small and diffusely diseased. This vessel was not graftable, but was filled  with collateral flow from the right coronary artery on angiogram.  The right  coronary artery was lying  deep in the AV groove and covered by a thick layer  of fat.  There is a posterior descending branch that was a medium caliber  vessel with minimal disease in it and this was chosen for grafting.   The aorta was cross clamped and 500 mL of cold blood antegrade cardioplegia  was administered in the aortic root with quick arrest of the heart.  Systemic hypothermia to 20 degrees Centigrade and topical hypothermia with  iced saline was used.  The temperature probe was placed in the septum and an  insulating pad in the pericardium.   The first distal anastomosis was performed to the obtuse marginal branch.  The internal diameter was about 1.6 mm distally. Conduit used was a segment  of greater saphenous vein. The anastomosis was performed in an end to side  manner using continuous 7-0 Prolene suture.  Flow was measured through the  graft and was excellent.   The second distal anastomosis was performed to the posterior descending  coronary artery.  The internal diameter was about 1.75 mm.  Conduit used was  a segment of greater saphenous vein.  The anastomosis was performed in an  end to side manner using continuous 7-0 Prolene suture.  Flow was measured  through the graft and was excellent.  Another dose of cardioplegia was given  down both vein grafts and in the aortic root.   The third distal anastomosis was performed to the mid portion of the left  anterior descending coronary artery.  The internal diameter was about 2 mm.  The conduit used was the left internal mammary graft and this was brought  through the opening in the left pericardium anterior to the phrenic nerve.  This was anastomosed to the LAD in an end to  side manner using continuous 8-  0 Prolene suture.  The pedicle was tacked to the epicardium with 6-0 Prolene  sutures.  The patient was rewarmed to 37 degrees Centigrade.  The two  proximal vein graft anastomoses were performed to the aortic root in an end  to side manner using continuous 6-0 Prolene suture.  This area was  significantly diseased with calcific plaque.  This made the proximal  anastomoses more difficult, particularly since this area of the vein was not  of large caliber. Then the clamp was removed from the mammary pedicle. There  was rapid warming of the ventricular septum and return of spontaneous  ventricular fibrillation.  The cross clamp was removed with a time of 67  minutes and the patient defibrillated into sinus rhythm.   The proximal and distal anastomoses appeared hemostatic and lie in the graft  satisfactorily.  Graft markers were placed around the proximal anastomoses. Two temporary right ventricular and right atrial pacing wires were placed  and brought out through the skin.   When the patient had rewarmed to 37 degrees Centigrade he was weaned from  cardiopulmonary bypass on no inotropic agents.  Total bypass time was 94  minutes.  Cardiac function appeared excellent with a cardiac output of 5  liters per minute.  Protamine was given and the venous and aortic cannulas  were removed without difficulty.  Hemostasis was achieved.  Four chest tubes  were placed with two in the posterior pericardium, one in the anterior  mediastinum and bilateral pleural tubes.  The pericardium was reapproximated  over the heart. The sternum was closed with #6 stainless steel wires.  The  fascia was closed with continuous #1 Vicryl suture.  The  subcutaneous tissue  was closed with running 2-0 Vicryl and the skin with 3-0 Vicryl subcuticular  closure.  The lower extremity vein harvest sites were closed in layers in a  similar manner. The sponge, needle and instrument counts were  correct at the  end of the procedure.  Dry sterile dressings were applied over the incisions  around the chest tubes and connected to Pleur-Evac suction.  The patient  remained hemodynamically stable and was transported to the SICU in guarded  but stable condition.                                               Evelene Croon, M.D.    BB/MEDQ  D:  09/05/2002  T:  09/06/2002  Job:  [id number]   cc:   [name], M.D.

## 2010-08-06 NOTE — Assessment & Plan Note (Signed)
[facility]                            CARDIOLOGY OFFICE NOTE   NAME:Walraven, TOSHIO SLUSHER              MRN:          [id number]  DATE:06/21/2006                            DOB:          06-28-1940    SUBJECTIVE:  Overall [name] is feeling relatively well.  He is not  having any palpitations.  He is not having any chest pain.  He is not  having any marked shortness of breath.  He is having weakness in his  legs.  He is not having classic claudication.  I do not know if this is  a vascular problem or neurologic problem.  We will proceed with follow-  up Dopplers, to be sure.  He is not having any syncope or pre-syncope.   He has atrial fibrillation of course and coronary artery disease, and  these appear to be stable at this point.  See further discussion below.   ALLERGIES:  PENICILLIN.   MEDICATIONS:  1. Aspirin.  2. Metoprolol.  3. Diltiazem.  4. Vytorin.  5. Coumadin.  6. Furosemide.  7. Metformin.  8. Potassium.  9. Amiodarone two tab (400 mg), alternating with 1-1/2 tab at 350 mg.      I had thought that he was on a lower dose and we will be decrease      it further today.  10.Benazepril.  11.Amaryl.   OTHER MEDICAL PROBLEMS:  See the extensive list below.   REVIEW OF SYSTEMS:  He is doing well, other than the weakness in his  legs.  The review of systems is otherwise negative.   PHYSICAL EXAMINATION:  VITAL SIGNS:  Weight today 235 pounds.  Blood  pressure 127/76, pulse 66.  GENERAL:  The patient is oriented to person, time and place.  Affect is  normal.  SKIN:  Multiple areas of ecchymoses.  This is a chronic problem.  He  also has skin changes with a grade 10 of amiodarone.  He has been  instructed to be careful and stay out of the sun or to stay covered as  much as possible.  HEENT:  No xanthelasma.  He has normal extraocular motion.  NECK:  There are no carotid bruits.  There is no jugular venous  distention.  LUNGS:  Clear, respiratory effort is not labored.  HEART:  Reveals an S1 and S2.  There are no clicks.  There is a soft  systolic murmur.  ABDOMEN:  Protuberant but soft.  There are no masses or bruits.  EXTREMITIES:  He has trace peripheral edema in his right ankle.  There  are 1+ dorsalis pedis pulses.  I do not feel posterior tibial pulses.   Electrocardiogram reveals that he has old inter-ventricular conduction  delay.  He is in sinus rhythm.   PROBLEMS:  1. Allergy to PENICILLIN.  2. Hypertension.  Blood pressure is quite good at this time.  3. History of pilonidal cyst.  4. Low back pain.  He has had injections, and now he has had a      procedure by Dr. Stefani Dama and his pain is much better.  I do  not know if this situation is playing a roll with his leg weakness.  5. Atrial fibrillation.  We have been able to keep him in sinus by      using higher dose amiodarone.  He has been on 400 mg, alternating      with 350 mg.  Today we will lower him to 350 mg daily.  6. History of an atrial clot.  7. Shortness of breath, stable.  8. History of some claudication.  Historically he has had adequate      flow in his feet.  He does need follow-up Dopplers at this time.  9. Coronary artery disease, post coronary artery bypass graft surgery      in 2004.  10.Status post wedge resection of the left upper lung, that was with a      benign lesion.  11.Diabetes.  12.Some limited wide complex tachycardia in the past.  13.Coumadin therapy.  14.Ejection fraction has normalized over time, but soon it will be      time for a follow-up echocardiogram.  15.History of gout.  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  I believe this is venous and      not an arterial problem.  17.Mild mitral regurgitation.  18.Status post abdominal aortic aneurysm repair by Dr. Balinda Quails      in the past.  19.Infected leg, after his bypass graft, which was treated.  20.History of chronic nasal  drainage.  21.Status post significant injury at work to his right leg.  22.Easy bruising.  We follow his INRs as closely as possible.  23.Thyroid stimulating hormone that was documented as slightly low in      2005.  The thyroid stimulating hormone of course is to be followed      and will be rechecked today related to this and also related to his      amiodarone therapy.  24.Current leg weakness.  I am not convinced that it is vascular.  We      will check Dopplers.   FOLLOWUP:  I will see him back soon to follow up his change in  amiodarone and his labs and the Dopplers.     [name], MD, [facility]  Electronically Signed    JDK/MedQ  DD: 06/21/2006  DT: 06/21/2006  Job #: [id number]   cc:   Stefani Dama, M.D.  Titus Dubin. Alwyn Ren, MD,FACP,FCCP

## 2010-08-06 NOTE — H&P (Signed)
NAME:  [name], [name]                       ACCOUNT NO.:  [id number]   MEDICAL RECORD NO.:  [id number]                   PATIENT TYPE:  INP   LOCATION:  NA                                   FACILITY:  [facility]   PHYSICIAN:  Balinda Quails, M.D.                 DATE OF BIRTH:  1940/10/14   DATE OF ADMISSION:  10/08/2002  DATE OF DISCHARGE:                                HISTORY & PHYSICAL   REFERRING PHYSICIANS:  1. Evelene Croon, M.D.  2. [name], M.D.  3. Titus Dubin. Alwyn Ren, M.D. [facility]   CHIEF COMPLAINT:  A 6.8-cm abdominal aortic aneurysm.   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  Postoperatively, he developed atrial fibrillation.  He has had prior  cardioversion for atrial fibrillation in January 2004.  He has recovered  nicely since his discharge on September 11, 2002 and now returns to see Dr. Madilyn Fireman  for a followup and repair of his abdominal aortic aneurysm.  He is admitted  for elective surgery at this time.   PAST MEDICAL HISTORY:  1. Atrial fibrillation with history of a clot by TEE and currently on     Coumadin therapy.  2. Hypertension for 20 years.  3. Hyperlipidemia.  4. New adult onset diabetes type 2, non-insulin dependent also found in June     2004.  5. History of ruptured cervical disk in 1996.   PAST SURGICAL HISTORY:  1. CABG x3 and PTCA June 2004.  2. Cervical disk repair 6-7 years ago.  3. Pilonidal cyst 40 years ago.  4. Varicocele repair 14 years ago.   CURRENT MEDICATIONS:  1. Lanoxin 0.125 mg daily.  2. Lopressor 50 mg 1/2 tablet t.i.d.  3. Zocor 20 mg daily.  4. Coumadin 5 mg Saturday, Monday, Wednesday, Friday and Sunday and 2.5 mg     on Tuesday and Thursday.  5. Lasix 40 mg  daily.  6. Glucophage 500 mg p.o. b.i.d.  7. Amiodarone 200 mg b.i.d.  8. Potassium chloride 20 mEq daily.   ALLERGIES:  PENICILLIN causes swelling, shortness of breath, and dizziness.   REVIEW OF SYSTEMS:  The patient is hard of hearing.  No history of thyroid  disease.  No swallowing difficulties.  Weight is stable but down since his  surgery.  Positive history of diabetes and hypertension.  The remainder of  review of systems is negative.   FAMILY HISTORY:  Mother died of a stroke at age 68 and father died at 52  with diabetes, coronary artery disease, and history of alcohol abuse.  He  has one brother three years older in  good health.   SOCIAL HISTORY:  He is married.  They have one adopted child.  He worked as  a Investment banker, operational.  He drinks 3-4 beers per day.  He smoked 3-4 packs  of cigarettes per day for 42 years and quit since June.   PHYSICAL EXAMINATION:  GENERAL:  This is a well-nourished, obese white male  in no acute distress.  VITAL SIGNS:  Blood pressure is 144/100 in the right arm and 120/88 in the  left arm.  Pulse was 82 and regular.  Respirations were 18 and unlabored.  HEENT:  Head:  Normocephalic.  Eyes:  PERLA.  EOM's intact.  Fundi not  visualized.  Ears, nose, throat, and mouth:  Grossly within normal limits.  CHEST:  Clear to auscultation and percussion.  No wheezes, rales, or  rhonchi.  Respirations were unlabored.  He has a well-healing sternotomy  incision.  CARDIAC:  Regular rate and rhythm.  No murmurs, rubs, or gallops.  Pulses  are +2 in the carotid, radial, femoral.  Popliteals are +1 to absent.  EXTREMITIES:  No clubbing, cyanosis, or edema.  He has bilateral endovein  incisions with a fluid collection in the right leg.  NEUROLOGIC:  No focal deficits.   IMPRESSION:  1. A 6.8-cm abdominal aortic aneurysm.  2. Status post coronary artery bypass graft x3 and percutaneous transluminal     coronary angioplasty with stenting x1 June 2004.  3.  Atrial fibrillation on Coumadin with clot previously noted in left atrium     on transesophageal echocardiography.  4. Adult onset diabetes mellitus.  5. Chronic obstructive pulmonary disease, status post resection of left     upper lobe lung mass.  6. Hyperlipidemia.   PLAN:  The patient is admitted for repair of abdominal aortic aneurysm.  Further treatment as indicated.     Eber Hong, P.A.                 Balinda Quails, M.D.    WDJ/MEDQ  D:  10/04/2002  T:  10/05/2002  Job:  [id number]

## 2010-08-06 NOTE — Discharge Summary (Signed)
NAME:  [name], [name]                       ACCOUNT NO.:  [id number]   MEDICAL RECORD NO.:  [id number]                   PATIENT TYPE:  INP   LOCATION:  2021                                 FACILITY:  [facility]   PHYSICIAN:  Balinda Quails, M.D.                 DATE OF BIRTH:  19-May-1940   DATE OF ADMISSION:  10/08/2002  DATE OF DISCHARGE:  10/11/2002                                 DISCHARGE SUMMARY   HISTORY OF PRESENT ILLNESS:  This is a 70 year old male status post coronary  artery bypass grafting x3 and left upper lobe wedge resection for __________  on September 05, 2002.  He was discharged and subsequently developed an abscess  in his right thigh incision.  He was admitted this hospitalization by Balinda Quails, M.D. for incision and drainage of the thigh abscess.   PAST MEDICAL HISTORY:  1. Atrial fibrillation with history of clot by TEE on chronic Coumadin     therapy.  2. Hypertension for 20 years.  3. Hyperlipidemia.  4. Adult-onset diabetes type 2.  5. History of ruptured cervical disk in 1996.   PAST SURGICAL HISTORY:  1. Coronary artery bypass grafting x3 and PTCA June 2004.  2. Cervical disk surgery six or seven years ago.  3. Pilonidal cyst four years ago.  4. Varicocele repair 14 years ago.   MEDICATIONS ON ADMISSION:  1. Lanoxin 0.125 daily.  2. Lopressor 50 mg one-half tablet t.i.d.  3. Zocor 20 mg daily.  4. Coumadin 5 mg Saturday, Monday, Wednesday, Friday, and Sunday and 2.5 mg     on Tuesday and Thursday.  5. Lasix 40 mg daily.  6. Glucophage 500 mg b.i.d.  7. Amiodarone 200 mg b.i.d.  8. Potassium chloride 20 mEq daily.   ALLERGIES:  PENICILLIN which causes swelling, shortness of breath, and  dizziness.   HOSPITAL COURSE:  The patient was admitted, taken to the operating room on  October 08, 2002.  Underwent incision and drainage of right thigh abscess.  The  patient tolerated procedure well.  Was taken to the postanesthesia care unit  in stable  condition.  Postoperative hospital course patient did well.  He  was begun on a course of local wound care with dressing changes b.i.d.  He  tolerated this without significant difficulty.  Coumadin was restarted for  previous atrial fibrillation.  Capillary blood glucose were under stable  control.  The patient was afebrile, hemodynamically stable.  Overall, the  patient was felt to be stable for discharge on October 11, 2002.   FINAL DIAGNOSES:  1. Right thigh abscess and status post venectomy.  2. Abdominal aortic aneurysm which will be repaired in the future.  3. Previous coronary artery bypass grafting x3.  4. Previous PTCA.  5. History of atrial fibrillation.  6. History of adult-onset diabetes mellitus.  7. History of chronic obstructive pulmonary disease.  8. History of hyperlipidemia.  DISCHARGE INSTRUCTIONS:  The patient received written instructions in regard  to medications, activity, diet, wound care, and follow-up.  Followup will  include P. Liliane Bade, M.D., Monday, August 16 at 9 a.m.   DISCHARGE MEDICATIONS:  Continue home medications.  Additionally, Avelox 400  mg daily for 10 days.  Additionally, arrangements were made for the patient  to have a home health nurse for wound care as well as PT/INR draw with  results to [name], M.D.      Rowe Clack, P.A.-C.                    Balinda Quails, M.D.    Sherryll Burger  D:  10/29/2002  T:  10/30/2002  Job:  [id number]

## 2010-08-06 NOTE — Assessment & Plan Note (Signed)
[facility]                            CARDIOLOGY OFFICE NOTE   NAME:[name]              MRN:          [id number]  DATE:02/21/2006                            DOB:          02/17/1941    [name] is doing well.  He has had several procedures by Dr. Danielle Dess,  and his back is feeling better.  He is followed carefully by Dr. Danielle Dess.  We have been lowering his amiodarone dose.  He is down to 300 mg daily,  and he is holding sinus rhythm.  I have decided not to change his dose  further today, but I will see him back in an additional 6 weeks.   The patient has vascular disease, as outlined extensively in my prior  notes.  He also has paroxysmal atrial fibrillation.   ALLERGIES:  PENICILLIN.   MEDICATIONS:  See the flow sheet.   OTHER MEDICAL PROBLEMS:  See the extensive note of June 19. 2007.   REVIEW OF SYSTEMS:  He is continuing to improve from his back.  Otherwise his review of systems is negative.   PHYSICAL EXAMINATION:  Today the blood pressure is 130/70 with a pulse  of 55.  The patient's weight is 241 pounds.  The patient is here with  his wife.  He is oriented to person, time and place, and his affect is normal.  LUNGS:  Clear.  Respiratory effort is not labored.  He has no xanthelasma.  There is normal extraocular motion.  There are  no carotid bruits.  There is no jugular venous distention.  CARDIAC:  Exam reveals an S1 with an S2. There are no murmurs.  ABDOMEN:  Protuberant but soft.  There are no masses.  He has trace peripheral edema.   PROBLEM LIST:  Problems are listed extensively on my note of September 06, 2005.  1. Hypertension.  For cost purposes we are going to change his Mavik      to benazepril (Lotensin) 40 mg daily.  2. Atrial fibrillation.  We will keep him at amiodarone 300 mg, and I      will see him back in an additional 6 weeks to be sure that his      pressure is stable on the medication change.  Also  it is okay for      him to change from Coumadin to warfarin.     [name], MD, [facility]  Electronically Signed    JDK/MedQ  DD: 02/21/2006  DT: 02/21/2006  Job #: [id number]

## 2010-08-06 NOTE — H&P (Signed)
NAME:  [name], [name]                       ACCOUNT NO.:  [id number]   MEDICAL RECORD NO.:  [id number]                   PATIENT TYPE:  INP   LOCATION:  NA                                   FACILITY:  [facility]   PHYSICIAN:  [name], M.D.                 DATE OF BIRTH:  [birth year]   DATE OF ADMISSION:  12/03/2002  DATE OF DISCHARGE:                                HISTORY & PHYSICAL   PRIMARY CARE PHYSICIAN:  Titus Dubin. Alwyn Ren, M.D. [facility].   CARDIOLOGIST:  [name], M.D.   CHIEF COMPLAINT:  Abdominal aortic aneurysm.   HISTORY OF PRESENT ILLNESS:  This is a 70 year old male patient referred by  Dr. Myrtis Ser for evaluation of AAA. The patient has a history of atrial  fibrillation and was cardioverted in January of 2004.  He is on Coumadin.  The patient had an ultrasound and a CT of the abdomen which revealed a 6.8  cm abdominal aortic aneurysm. The patient denies abdominal and back pain.  The patient is status post CABG x3 on September 05, 2002, and a left upper lobe  wedge resection of a lesion which was found to be a hemartoma. The patient  developed atrial fibrillation postoperatively. The patient was stable on  discharge. Subsequently the patient developed an abscess at the site of the  right thigh endoscopic vein harvest.  The patient was admitted and had an  incision and drainage of the abscess on October 08, 2002, by Dr. Madilyn Fireman.  The  patient denies abdominal pain, nausea or vomiting, and constipation as well  as diarrhea. The patient also denies hematemesis, claudication symptoms, and  dysuria. The patient denies GERD symptoms and shortness of breath. The  patient does experience peripheral edema and mild dyspnea on exertion.   PAST MEDICAL HISTORY:  1. Coronary artery disease.  2. Hypertension.  3. Hyperlipidemia.  4. Atrial fibrillation and left atrial clot by transesophageal     echocardiogram.  5. Diabetes mellitus type 2.  6. Ruptured cervical disk in 1996.   PAST  SURGICAL HISTORY:  1. CABG x3 on September 05, 2002.  The patient has also had a stent.  2. Cervical disk repair.  3. Pilonidal cyst.  4. Varicocele repair.  5. Incision and drainage of abscess right thigh.   MEDICATIONS:  1. Lanoxin 0.125 mg daily.  2. Potassium 20 mEq daily.  3. Metoprolol 50 mg 1/2 tablet t.i.d.  4. Zocor 20 mg one p.o. daily.  5. Coumadin 2.5 to 5 mg daily which was discontinued on November 28, 2002.  6. Lasix 40 mg one p.o. daily.  7. Metformin 500 mg one p.o. b.i.d.  8. Amiodarone 200 mg two p.o. daily.   ALLERGIES:  PENICILLIN.   REVIEW OF SYSTEMS:  See HPI for significant positives and negatives. The  patient is positive for diabetes mellitus. He denies kidney disease.  No  history of TIA or  CVA.   FAMILY HISTORY:  Mother is positive for hypertension and CVA. Father is  positive for diabetes mellitus and hypertension. One brother is positive for  diabetes mellitus.   SOCIAL HISTORY:  This is a married, 70 year old white male with one child.  He is a Surveyor, quantity.  The patient denies alcohol use and he quit smoking.  The patient used to smoke three packs per day x40 years.   PHYSICAL EXAMINATION:  VITAL SIGNS: Blood pressure 198/100 in the right arm,  190/105 in the left arm, pulse 60, and respirations 16.  GENERAL: This is a 70 year old male in no acute distress. He is alert and  oriented x3.  HEENT:  Normocephalic and atraumatic.  Pupils equal, round, and reactive to  light and accommodation. Extraocular movements are intact. There is no  evidence of cataracts or glaucoma.  NECK: Supple, there is no jugular venous distention.  No bruits are  auscultated.  There is no lymphadenopathy.  CHEST: Symmetrical on inspiration.  LUNGS:  Clear to auscultation. There are no wheezes, rhonchi, or rales.  HEART:  Irregular rate and rhythm. No murmurs, rubs, or gallops.  ABDOMEN: Soft and nontender. Bowel sounds are present x4.  The AAA is  palpable and there is  positive bruit over the abdominal aorta.  GENITOURINARY: RECTAL: Deferred.  EXTREMITIES: There is no clubbing, cyanosis, edema, or ulcerations.  Temperature is warm.  Peripheral pulses; carotid, femoral, and posterior  tibialis are all 2+ bilaterally.  NEUROLOGY:  Nonfocal. Gait is steady.  Deep tendon reflexes are 2+. Muscle  strength is 5/5.   ASSESSMENT:  R&G AAA at [facility] on December 03, 2002.  Dr.  Madilyn Fireman has seen and evaluated the patient prior to this admission and has  explained the risks and benefits of the procedure and the patient has agreed  to continue.      [name], [name]                      [name], M.D.    AY/MEDQ  D:  12/02/2002  T:  12/02/2002  Job:  [id number]

## 2010-08-06 NOTE — Assessment & Plan Note (Signed)
[facility]                              CARDIOLOGY OFFICE NOTE   NAME:[name]              MRN:          [id number]  DATE:10/25/2005                            DOB:          1940-09-01    [name] is seen for followup.  Also send a copy of my note of September 06, 2005, to Dr. Danielle Dess.   HISTORY OF PRESENT ILLNESS:  [name] is feeling well today.  He was  cardioverted recently.  He converted on the second shock.  I stopped his  Lanoxin because of bradycardia.  He clearly feels better in sinus rhythm and  he is doing well at this time.   The patient is looking forward to having a back procedure by Dr. Danielle Dess in  the near future.  His cardiac status is stable and he is cleared for this.  His Coumadin can be held for this procedure, but we want to carefully hold  it and then restart it carefully as soon as it is appropriate.  He does not  need any significant bridging when his Coumadin is held.   ALLERGIES:  PENICILLIN.   MEDICATIONS:  1.  Aspirin 81 mg.  2.  Lanoxin 0.125 mg.  3.  Amaryl.  4.  Metoprolol 25 mg b.i.d.  5.  Mavik 4 mg.  6.  Diltiazem ER 180 mg b.i.d.  7.  Vytorin.  8.  Coumadin as directed.  9.  Furosemide 40 mg.  10. Metformin.  11. Potassium.   OTHER MEDICAL PROBLEMS:  See the extensive list on my note dated September 06, 2005.   REVIEW OF SYSTEMS:  He feels much better in sinus rhythm.  He does have some  swelling in his right leg.  I have encouraged him to cut back somewhat on  his fluid intake, as I believe he is drinking more than his Lasix will help  manage.  Otherwise, the review of systems is negative.   PHYSICAL EXAMINATION:  VITAL SIGNS:  Blood pressure 120/70, pulse of 45.  GENERAL:  The patient is oriented to person, time, and place, and his affect  is normal.  LUNGS:  Clear.  Respiratory effort is not labored.  HEENT:  No xanthelasma.  He has normal extraocular motion.  CARDIAC:  S1 and  S2.  There are no clicks.  ABDOMEN:  Protuberant, but soft.  He does have 1+ edema in his right lower  extremity.  This is the area that was injured.   PROBLEMS:  Listed extensively on the note of September 06, 2005.   PLAN:  1.  Atrial fibrillation.  He is holding sinus rhythm on high dose      Amiodarone.  I am not going to change the dose at this time.      Previously, he went back into atrial fibrillation with lower doses.  2.  Coronary disease.  He is post coronary artery bypass grafting in 2004,      and he is stable and does not need any further testing at this time.  3.  Coumadin therapy.  This can be held for the back  procedure.  4.  Easy brusibility.  We will have to keep this in mind, but his INR needs      to be kept between 2 and 2.5.  5.  Back pain.  He will be cleared for a back procedure by Dr. Danielle Dess.                               [name], MD, [facility]    JDK/MedQ  DD:  10/25/2005  DT:  10/25/2005  Job #:  [id number]   cc:   Stefani Dama, MD  Titus Dubin. Alwyn Ren, MD, FCCP

## 2010-08-06 NOTE — Discharge Summary (Signed)
NAME:  [name], [name]                       ACCOUNT NO.:  [id number]   MEDICAL RECORD NO.:  [id number]                   PATIENT TYPE:  INP   LOCATION:  2020                                 FACILITY:  [name]   PHYSICIAN:  [name], M.D.                  DATE OF BIRTH:  [birth year]   DATE OF ADMISSION:  09/05/2002  DATE OF DISCHARGE:  09/11/2002                                 DISCHARGE SUMMARY   ADMISSION DIAGNOSIS:  Three-vessel coronary artery disease.   PAST MEDICAL HISTORY:  1. Abdominal aortic aneurysm, measures 6.8 cm, is being followed by Dr. Balinda [name].  2. Paroxysmal atrial fibrillation, with history of left atrial clot by TEE .     Treated with Coumadin from November 2003 until recently.  Also status     post cardioversion January 2004.  3. Hyperlipidemia.  4. Hypertension.  5. Recently diagnosed diabetes mellitus type 2.   ALLERGIES:  The patient indicates allergy to PENICILLIN; he had a severe  reaction as a child.   DISCHARGE DIAGNOSES:  1. Severe 3-vessel coronary artery disease.  2. Right chest sub-pleural nodular mass, status post CABG and excision right     chest sub-pleural mass.  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   BRIEF HISTORY:  [name] is a 70 year old Caucasian male.  He is  a patient of Dr. Madilyn [name] who has a large known abdominal aortic aneurysm.  He  recently underwent an aortogram to evaluate his aneurysm for surgical repair  and developed chest pain.  Cardiac catheterization was performed and  revealed severe 3-vessel coronary disease.  He subsequently underwent  percutaneous coronary intervention on Aug 08, 2002.  After that had a stress  test on Aug 13, 2002, which showed mild to moderate ischemia on the inferior  wall.  Ejection fraction was estimated to be 54%.   He was referred to Dr. Laneta [name] for consideration of surgical intervention.  Dr. Laneta [name] saw him at in the [name] office on August 27, 2002.  After  examination  of the patient and review of records, including the catheterization films  and the Cardiolite scans, Dr. Laneta [name] recommended proceeding with coronary  artery bypass grafting prior to proceeding with his surgery for his large  abdominal aortic aneurysm.  The procedure, risks, and benefits were all  discussed with [name] and he agreed to proceed with surgery.  He is  scheduled for elective admission.   On September 05, 2002 [name] was electively admitted to [name]  in care of Dr. Evelene [name] and underwent the following surgical procedures:  1. Coronary artery bypass grafting x 3.  Grafts placed during procedure:     Left internal mammary artery to the left anterior descending artery,     saphenous venous graft to the obtuse marginal artery, saphenous venous     graft to the  posterior descending coronary artery.  2. Wedge resection of left upper lobe lesion.  3. Excision of right chest wall mass.   He tolerated these procedures well, transferred in stable condition to SICU.  He remained hemodynamically stable in the immediate postoperative period and  he was extubated several hours after arrival in the ICU.   His diabetes was treated with the Glucomander protocol in the immediate  perioperative period and switched to Lantus on postoperative day 1 and  Glucophage 500 mg b.i.d. was added to his regimen when he was taking p.o.  well.  Diabetes education was also initiated during this hospitalization.   [name] atrial fibrillation returned in the postoperative period.  He  was treated with amiodarone.  He did not convert to normal sinus rhythm.  Coumadin therapy was initiated.  He initially had some bradycardia.  Over  the next several days after surgery his heart rate did increase.  Lopressor  was initiated and increased as he tolerated this.  He will be going home on  Lopressor 25 mg t.i.d.   [name] postoperative course has been uneventful.  He  is making very  good progress and recovering from his surgery.  As above, diabetes education  was initiated during hospitalization including instructions regarding use of  a blood glucose meter at home.  He is also provided with a Glucometer.   On September 10, 2002 [name] was asked to come see [name] in  regards to treatment of his atrial fibrillation.  Dr. Andee [name] evaluated him  and agreed with continuing amiodarone at 400 mg q. day dose and increasing  with Lopressor to 25 t.i.d.  Plans will be arranged for followup at the [name] office for an EKG in 1 week, followup with his cardiologist in 2  weeks, and planned tentatively for DC CV in 4-6 weeks.   The morning of September 11, 2002, postoperative day 6, [name] reports  feeling very well.  His vital signs are stable.  Blood pressure 110/70.  He  is afebrile.  His room air saturation is 95%.  His heart is in atrial  fibrillation at a rate of approximately 94 beats per minute.  His lungs are  clear.  His bowel or bladder functions are within normal limits for him.  He  is tolerating his diet without nausea.  His incisions are all healing very  well.  He has mild bilateral pedal edema, which was present preoperatively.  He is ambulating independently.  His balance is well-controlled.  His weight  is 242.9 lb.  This is several pounds under his preoperative weight.  Mr.  Terral is made very good progress on recovery from his surgery and he is  ready for discharged home today.   LABORATORY STUDIES:  September 11, 2002:  Chemistries include:  Sodium 140.  Potassium 3.6.  Chloride 106.  CO2 of 28.  BUN 13.  Creatinine 1.0.  Glucose  105.  PT 22.8.  INR 2.3.  September 07, 2002 CBC:  White blood cell is 9.7.  Hemoglobin 11.2.  Hematocrit 32.2.  Platelets 141,000.   CONDITION ON DISCHARGE:  Improved.   MEDICATIONS:  New Medications:  1. Amiodarone 400 mg p.o. q.d. 2. Metformin 500 mg b.i.d. he has also been reminded to check his  blood     sugar daily and record.  3. Potassium 20 mEq p.o. q.d.   He has also been instructed to resume the following home medications:  1. Digoxin  0.125 mg q.d.  2. Lopressor 25 mg t.i.d. (this is a new dose for him).  3. Lasix 40 mg q.d.  4. Zocor 20 mg q.d.  5. Coumadin.  He has been instructed to take 2.5 mg today, September 11, 2002,     and tomorrow, September 12, 2002, and to have his PT and INR checked on Friday     June 25.  [name] will regulate his Coumadin dosage from     then on.  6. Indocin 50 mg t.i.d. p.r.n. for gout pain.  7. For pain management he may have Tylox 1-2 p.o. q.4-6h. for moderate to     severe pain or Tylenol 325 mg 1-2 p.o. q.4-6h. for mild pain.   ACTIVITY:  He has been asked to refrain from any driving or any heavy  lifting, pushing, or pulling.  He has also be encouraged to continue his  breathing exercises and daily walking.   DIET:  Should be a diabetic diet.   WOUND CARE:  He may shower at home with mild soap and water.   SPECIAL INSTRUCTIONS:  If he has a fever over 101 degrees Fahrenheit, or if  his incisions become [name], hot, swollen, or draining he is to call Dr.  Sharee Pimple office.   FOLLOW UP:  1. As above, [name] in approximately 1 week for EKG.  That will be     followed by a visit with Dr. Myrtis Ser in approximately 2 weeks.  He will also     have a chest x-ray that day.  2. Dr. Laneta [name] would like to see him in 3 weeks in the [name].  He will     also been seen at the [name] by one of the cardiac surgeons next     week prior to a planned trip to the beach for the July 4th weekend.  3. Mr. Orsak has been asked to follow with Dr. Alwyn Ren in the next few     weeks, specifically regarding diabetes management.     Toribio Harbour, N.P.                  [name], M.D.    CTK/MEDQ  D:  09/11/2002  T:  09/12/2002  Job:  [id number]   cc:   [name], M.D.  [address]  [place]  [place] [address]  Fax:  [phone]   [name], M.D.   Titus Dubin. Alwyn Ren, M.D. [name]    cc:   [name], M.D.  [address]  [place]  [place] [address]  Fax: [phone]   [name], M.D.   Titus Dubin. Alwyn Ren, M.D. [facility]

## 2010-08-06 NOTE — Cardiovascular Report (Signed)
NAME:  [name], [name]                       ACCOUNT NO.:  [id number]   MEDICAL RECORD NO.:  [id number]                   PATIENT TYPE:  OIB   LOCATION:  3735                                 FACILITY:  [facility]   PHYSICIAN:  Balinda Quails, M.D.                 DATE OF BIRTH:  [birth year]   DATE OF PROCEDURE:  07/25/2002  DATE OF DISCHARGE:                              CARDIAC CATHETERIZATION   DIAGNOSIS:  A 6.8 cm infrarenal abdominal aortic aneurysm.   PROCEDURE:  1. Abdominal aortogram with pelvic arteriography.  2. Intravascular ultrasound, abdominal aorta.   ACCESS:  Femoral artery 8-French sheath.   CONTRAST:  130 mL Visipaque.   COMPLICATIONS:  None apparent.   CLINICAL NOTE:  [name] is a 70 year old gentleman with a 6.8 cm  infrarenal abdominal aortic aneurysm.  He is brought to the arteriography  suite at this time for an aortogram and intravascular ultrasound to  determine if he is a candidate for an aortic stent graft.   DESCRIPTION OF PROCEDURE:  The patient was brought to the cath lab in stable  condition, placed in the supine position, administered 2 mg of Nubain and 2  mg Versed intravenously.  Right groin was prepped and draped in the sterile  fashion.  Skin and subcutaneous tissue was instilled with 1% Xylocaine.  An  needle was easily introduced in the common femoral artery.  A 0.25 K-wire  passed through the needle and passed into the mid abdominal aorta under  fluoroscopy.  A 5-French sheath advanced over the guidewire with the dilator  removed and the sheath flushed with heparin and saline solution.   Graduated pigtail catheter was then advanced over the guidewire to the mid  abdominal aorta.  A standard AP mid abdominal aortogram obtained.  This  revealed single renal arteries bilaterally.  Lateral stenosis of the left  renal artery estimated to be 50% at its origin was present.  The right renal  artery was widely patent.  The infrarenal aorta  revealed that  atherosclerotic irregularity with a long infrarenal neck measuring 5-6 cm.  There was irregularity of the infrarenal aorta.  There was a saccular-type  aneurysm in the terminal aorta just proximal to the aortic bifurcation.   The pigtail catheter was then repositioned and a lateral aortogram obtained.  This again outlined the saccular-type nature to the infrarenal aortic  aneurysm which was extended posteriorly.  The pigtail catheter was brought  down to the aortic bifurcation.  AP and bilateral oblique pelvic arteriogram  obtained.  The common iliac arteries bilaterally were mildly ectatic.  The  common iliac arteries were quite short measuring only a couple of  centimeters prior to the take-off of the internal iliac artery bilaterally.   The pigtail catheter was then exchanged over the guidewire.  The 6-French  sheath removed and an 8-French sheath advanced over the guidewire.  The  dilator removed and  the sheath flushed with heparin saline solution.  The  IVUS catheter was then advanced over the guidewire.  Intravascular  ultrasound of the juxtarenal and infrarenal aorta was obtained.  This was  primarily carried out for sizing of the infrarenal aorta.  The infrarenal  aorta measured 2.7 cm in maximal diameter.   This completed the arteriogram procedure.  The IVUS catheter was removed and  the right femoral sheath was removed.  There were no apparent complications.   FINAL IMPRESSION:  1. Saccular infrarenal abdominal aortic aneurysm at the aortic bifurcation.  2. Single bilateral renal arteries.  Mild to moderate left renal artery     origin stenosis.  3. Short common iliac arteries bilaterally with ectasia.  4. Intact external iliac run-off bilaterally.   DISPOSITION:  These results have been reviewed with the patient and family.  The patient is not felt to be a candidate for aortic stent grafting due to  size of the infrarenal aortic neck in terms of diameter,  and also the short  nature of the common iliac arteries bilaterally.  He will require open  repair.  Lovenox and Coumadin will be reinstituted tomorrow.                                               Balinda Quails, M.D.    PGH/MEDQ  D:  07/25/2002  T:  07/26/2002  Job:  [id number]   cc:   [name], M.D.   [name]

## 2010-08-06 NOTE — Discharge Summary (Signed)
NAME:  [name], [name]                       ACCOUNT NO.:  [id number]   MEDICAL RECORD NO.:  [id number]                   PATIENT TYPE:  OIB   LOCATION:  5032                                 FACILITY:  [facility]   PHYSICIAN:  Almedia Balls. Ranell Patrick, M.D.              DATE OF BIRTH:  Apr 23, 1940   DATE OF ADMISSION:  11/10/2003  DATE OF DISCHARGE:  11/12/2003                                 DISCHARGE SUMMARY   ADMISSION DIAGNOSES:  1. Fractured right ankle.  2. Hypertension.  3. Diabetes.   DISCHARGE DIAGNOSES:  1. Fractured right ankle, status post open reduction and internal fixation.  2. Diabetes.  3. Hypertension.   BRIEF HISTORY:  The patient is a 70 year old male who presented to the acute  care clinic at [facility] after a fall at work.  The patient  states he was walking and he tripped and fell, injuring his right shoulder,  and also his right ankle.  The patient presented to the office with an  obvious deformity and swelling to the right ankle.  X-rays demonstrated a  trimalleolar fracture and he elected for surgical management, thus was  admitted to the hospital.   PROCEDURE:  The patient had a right ankle open reduction and internal  fixation using a [employer] small fragment set.  Attending surgeon was Dr.  Almedia Balls. [place].  Assistant was [employer]. Durwin Nora, P.A.  Tourniquet time was  55 minutes.  Estimated blood loss was minimal, 600 mL of crystalloid  replacement and perioperative antibiotics were given.  He tolerated the  procedure well and this procedure was completed on November 10, 2003.   HOSPITAL COURSE:  The patient was admitted to the hospital with stable  laboratory work, thus had the above-stated procedure on November 10, 2003.  The patient was then transferred to the postanesthesia care unit and then  transferred up 5000 after sufficient time in PACU.  The patient, postop day  1, was complaining about severe pain to his right ankle.  He was unable to  move  and he states that anytime he drops his leg over the side of the bed to  get to the restroom, it causes him severe pain.  He denies any fevers or  chills after surgery.  Neurovascularly, he was intact.  Postop day 2, the  patient was feeling much better, pain was under better control; he states  his pain had gone from an 8/10 postop day 1 to 4/10 on postop day 2.  The  patient's color had improved and after physical therapy, he was to be  scheduled to be discharged home with home health physical therapy.  His  Coumadin levels were checked and on discharge day, he was not completely  therapeutic with his INR.   DISCHARGE PLANNING:  The patient was discharged home on November 12, 2003.   ACTIVITY:  The patient is to be nonweightbearing with a rolling walker with  no weightbearing on his right leg.   MEDICATIONS:  1. Coumadin per protocol.  2. Glucophage 500 mg b.i.d.  3. K-Dur 20 mEq daily.  4. Lanoxin 0.125 mg daily.  5. Lasix 40 mg daily.  6. Lopressor 25 mg p.o. b.i.d.  7. Dilaudid 4 mg p.o. q.4-6 h.  8. Robaxin 500 mg q.6 h. p.r.n.   ALLERGIES:  Allergies are to PENICILLIN.   FOLLOWUP:  The patient is to follow up in our office in 1 week for a short-  leg cast and to continue nonweightbearing.   CONDITION ON DISCHARGE:  The patient's condition is stable.   DIET:  His diet is a diabetic diet.      Thomas B. Durwin Nora, P.A.                     Almedia Balls. Ranell Patrick, M.D.    TBD/MEDQ  D:  11/12/2003  T:  11/12/2003  Job:  [id number]

## 2010-08-06 NOTE — Cardiovascular Report (Signed)
NAME:  TRINTEN, BOUDOIN NO.:  [id number]   MEDICAL RECORD NO.:  [id number]                   PATIENT TYPE:  INP   LOCATION:  4709                                 FACILITY:  [facility]   PHYSICIAN:  Veneda Melter, M.D. [facility]               DATE OF BIRTH:  April 20, 1940   DATE OF PROCEDURE:  02/08/2002  DATE OF DISCHARGE:                              CARDIAC CATHETERIZATION   PROCEDURE:  1. Left heart catheterization.  2. Left ventriculogram.  3. Selective coronary angiography.   CARDIOLOGIST:  Veneda Melter, M.D.   DIAGNOSES:  1. Moderate three-vessel coronary artery disease.  2. Moderate left ventricular systolic function.  3. Atrial fibrillation.   HISTORY:  The patient is a 70 year old gentleman who presents with  substernal chest discomfort.  The patient was found to be in atrial  fibrillation and was admitted to the hospital and stabilized medically. Due  to persistence of symptoms, he was referred for further cardiac assessment.   TECHNIQUE:  Informed consent was obtained.  The patient was brought to the  cardiac catheterization lab.  A 6-French sheath was placed in the right  femoral artery using modified Seldinger technique.  The  6-French JL4 an JR4  catheters were then used to engage the left and right coronary arteries.  Selective angiography was performed in various projections using manual  injection of contrast.  A 6-French pigtail catheter was advanced in the left  ventricle and left ventriculogram performed using power injection of  contrast.  At the termination of the case, catheters and sheaths were  removed and manual pressure applied until adequate hemostasis was achieved.  The patient tolerated the procedure well and was transferred to the floor  instable condition.   FINDINGS:  1. Left main trunk:  Short, angiographically has mild disease.  2. LAD:  This is a medium caliber vessel that provides three diagonal     branches.  The LAD has  moderate diffuse disease  in the mid section of up     to 60 to 70%, then it becomes a small caliber vessel as it extends to the     apex.,  There are three diagonal branches.  The first diagonal branch is     a small caliber vessel with ostial narrowing of 50%.  The second diagonal     branch is a trivial vessel with ostial narrowing of 80%.  The third     diagonal branch is also trivial in size and has moderate disease.  3. Left circumflex artery:  This is a large caliber vessel that provides two     marginal branches.  The A-V circumflex has moderate disease of 70% in the     proximal segment.  The first marginal branch has moderate diffuse disease     of 50% in the proximal mid section.  The second marginal branch is 100%     occluded proximally.  It fills predominantly via collaterals from the     right coronary artery and exhibits mild disease.  4. Right coronary artery:  This is dominant.  It is a medium  caliber vessel     that provides a small posterior descending artery at its terminal     segment.  The right coronary artery has mild narrowing of 30% in the     proximal segment.  There is then a long segment of diffuse disease of 50     to 60% in the mid section.  The posterior descending artery has mild     irregularities.  5. Left ventricle:  Normal end-systolic and end-diastolic dimensions.     Overall left ventricular function appears moderately impaired.  Ejection     fraction is approximately 35%.  There is at least 2+ mitral     regurgitation.  LV pressure is 130/20, aortic pressure 130/90.  LV EDP equals 25.   ASSESSMENT/PLAN:  The patient is a 70 year old gentleman with moderate three-  vessel coronary artery disease and left ventricular dysfunction.  He has  significant disease in the arteriovenous circumflex and occluded second  marginal branch.  He has mild to moderate left ventricular dysfunction that  is out of proportion to his coronary disease and may be  contributing to his  symptoms.  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  At this  point,we will initiate Coumadin therapy and plan transesophageal  echocardiogram with direct current cardioversion should the patient have no  intramyocardial clots in order to facilitate return of normal sinus rhythm.  Reassessment of the patient's symptoms left ventricular function may then be  performed to determine if the circumflex disease is compromising perfusion  or contributing to his chest pain.  Percutaneous intervention to the  circumflex may then be considered.  Otherwise, continued medical therapy  will be recommended.                                               Veneda Melter, M.D. [facility]    NG/MEDQ  D:  02/08/2002  T:  02/08/2002  Job:  [id number]   cc:   [name], M.D. [facility]   Titus Dubin. Alwyn Ren, M.D. [facility]

## 2010-08-06 NOTE — Op Note (Signed)
NAME:  [name], [name]                       ACCOUNT NO.:  [id number]   MEDICAL RECORD NO.:  [id number]                   PATIENT TYPE:  INP   LOCATION:  2312                                 FACILITY:  [facility]   PHYSICIAN:  Balinda Quails, M.D.                 DATE OF BIRTH:  10-25-1940   DATE OF PROCEDURE:  DATE OF DISCHARGE:                                 OPERATIVE REPORT   PREOPERATIVE DIAGNOSIS:  6.8 cm infrarenal abdominal aortic aneurysm.   POSTOPERATIVE DIAGNOSIS:  6.8 cm infrarenal abdominal aortic aneurysm.   PROCEDURE:  1. Repair of infrarenal abdominal aortic aneurysm.  2. Aortobifemoral bypass with 16 x 8 bifurcated Hemashield graft.   SURGEON:  Balinda Quails, M.D.   ASSISTANT:  Janetta Hora. Fields, M.D. and Claudette Royston Sinner, N.P.   ANESTHESIA:  General endotracheal anesthesia.   ANESTHESIOLOGIST:  Germaine Pomfret, M.D.   CLINICAL NOTE:  The patient is a 70 year old male with a history of known  abdominal aortic aneurysm.  During workup for this initially he underwent  cardiac stress test and cardiac catheterization and required coronary artery  bypass.  This was subsequently complicated by the development of an infected  hematoma at his vein harvest site in his lower extremity requiring incision  and drainage.  He has now healed all those incisions.  He is brought to the  operating room at this time for elective abdominal aortic aneurysm repair.   DESCRIPTION OF PROCEDURE:  The patient was brought to the operating room in  stable condition. He was placed in the supine position.  General  endotracheal anesthesia was induced. A Foley catheter, arterial lines and a  Swan-Ganz catheter were inserted.   The abdomen and both legs were prepped and draped in a sterile fashion.   A longitudinal skin incision was made from xiphoid to pubis.  The incision  was extended deeply through the subcutaneous tissue.  The linea alba was  incised. The peritoneal cavity was  entered without difficulty.   The stomach and duodenum were normal.   The liver, gallbladder, bile ducts and pancreas were unremarkable.  The  small bowel was normal.  The large bowel revealed no masses.   There was a large saccular aneurysm at the aortic bifurcation. This  projected posteriorly.   The transverse colon was brought superiorly and the small bowel retracted to  the right.  The retroperitoneum was incised over the proximal infrarenal  aorta.  There was an inferior pole left accessory renal artery identified  and this was mobilized and encircled with a vessel loop.  Dissection was  carried proximally and the left renal vein and the inferior mesenteric vein  were retracted superiorly.  The juxta renal aorta cleared up at the origin  of the main renal arteries bilaterally.  There was a long infrarenal aortic  neck.  Distal dissection was then carried down along the infrarenal aorta  and the inframesenteric artery was freed at its origin and encircled with a  vessel loop.  The large saccular aneurysm was noted at the termination of  the aorta.  The common iliac arteries bilaterally were heavily calcified.  The retroperitoneum was incised along the right common iliac artery.  The  external iliac artery was also heavily calcified.  The left common iliac and  external iliac arteries were also heavily diseased with calcified plaque.  At this time, it was clear that an aortobifemoral bypass would be required  due to heavy calcified iliac arteries.   The right common iliac artery was dissected free at its termination and  encircled with umbilical tape.   The reflection of the sigmoid mesocolon was incised and the left common  iliac bifurcation exposed.  This was also encircled with an umbilical tape.  The ureters were clearly identified bilaterally.   Longitudinal and bilateral femoral groin incisions were made.  The  subcutaneous tissues and fat were divided with  electrocautery.  The common  femoral artery was exposed with the inguinal ligament bilaterally and  encircled with a vessel loop.  Distal dissection was carried down to expose  the profunda and superficial femoral arteries origins and were encircled  with vessel loops.  The common femoral arteries were diseased with calcified  plaque bilaterally.  The right superficial femoral artery was soft  anteriorly at its origin.  The left common femoral artery revealed an  anterior wall that was relatively soft.   The patient was administered 25 grams of Mannitol intravenously and 7,000  units of heparin intravenously.  Adequate circulation time was permitted.  The infrarenal aorta was controlled with an aortic DeBakey clamp.  The  common iliac arteries bilaterally were ligated with umbilical tapes.   The infrarenal aorta was then opened longitudinally with electrocautery.  Back bleeding was noted from the inferior mesenteric artery which was  controlled with a Seraphim clamp.  The inferior pole left accessory renal  artery was also controlled with a Seraphim clamp.  The aneurysm was opened  up proximally and divided just beyond the origin of the inferior pole of the  left accessory renal artery.  A 16 x 8 Hemashield bifurcated graft was  anastomosed end to the end to the infrarenal aorta using running 3-0 Prolene  suture.  At the completion of the proximal anastomosis the graft was flushed  and each limb of the graft controlled with a Fogarty clamp.  The proximal  clamp was released and the inferior pole left renal artery was reperfused.   Each limb of the graft was then tunneled retroperitoneal to the respective  groin.   In the right groin, the superficial femoral artery was controlled proximally  and distally with clamps and a longitudinal arteriotomy was made.  The right  limb of the graft was beveled and anastomosed end to side to the proximal right superficial femoral artery using running  5-0 Prolene suture.  The  graft was then flushed and the clamps were removed with reperfusion to the  right leg without difficulty.   In the left groin, the femoral vessels were controlled with clamps.  The  common femoral artery was opened longitudinally.  There was a large amount  of exophytic cauliflower type plaque arising from the posterior wall of the  common femoral artery.  The arteriotomy was extended onto the left  superficial femoral artery.  The exophytic plaque was endarterectomized from  the common femoral artery and origin of the  left superficial femoral artery.  The left limb of the graft was then beveled and anastomosed end to side to  the left common femoral artery using running 5-0 Prolene suture.  The graft  was then flushed and the clamps were removed re-perfusing the left leg.   Attention was placed on the inferior mesenteric artery.  This was released  and revealed excellent brisk back bleeding.  This was ligated with 2-0 silk.   The groin incisions were then irrigated with saline solution.  The  subcutaneous tissue was closed in two layers with running 2-0 Vicryl suture  and staples were applied to the skin.   The retroperitoneum was reapproximated over the aortic graft with running 3-  0 Vicryl suture.  The abdomen was examined to assure there were no retained  instruments or sponges.  The patient was administered 50 mg of Protamine  intravenously.   The midline fascia was then closed with running #1 PDS suture.  The apex of  the incision was re-closed with interrupted figure-of-eight #1 Ethibond  suture.  The subcutaneous tissue was irrigated with saline solution.  The  skin was then closed with staples and a sterile dressing applied.   The patient tolerated the procedure well.  He was transferred to the  recovery room in stable condition.  There were no apparent complications.                                               Balinda Quails, M.D.     PGH/MEDQ  D:  12/03/2002  T:  12/03/2002  Job:  [id number]   cc:   [name], M.D.

## 2010-08-06 NOTE — Discharge Summary (Signed)
NAME:  [name], [name]                       ACCOUNT NO.:  [id number]   MEDICAL RECORD NO.:  [id number]                   PATIENT TYPE:  INP   LOCATION:  2023                                 FACILITY:  [facility]   PHYSICIAN:  Balinda Quails, M.D.                 DATE OF BIRTH:  March 13, 1941   DATE OF ADMISSION:  12/03/2002  DATE OF DISCHARGE:  12/08/2002                                 DISCHARGE SUMMARY   PRIMARY ADMITTING DIAGNOSIS:  Abdominal aortic aneurysm.   ADDITIONAL/DISCHARGE DIAGNOSES:  1. Abdominal aortic aneurysm.  2. Coronary artery disease status post coronary artery bypass grafting September 05, 2002.  3. History of atrial fibrillation on chronic Coumadin therapy followed by     [facility].  4. Hypertension.  5. Hyperlipidemia.  6. Type 2 non-insulin-dependent diabetes mellitus.  7. History of ruptured cervical disc.   PROCEDURE PERFORMED:  Resection and grafting of abdominal aortic aneurysm  with aortobifemoral Hemashield 16 x 8 mm bifurcated graft.   HISTORY OF PRESENT ILLNESS:  The patient is a 69 year old white male who was  referred to Dr. Madilyn Fireman by Dr, Myrtis Ser for evaluation of an abdominal aortic  aneurysm.  He was hospitalized in January of 2004, for atrial fibrillation  at which time he was cardioverted.  At that time, he was discovered to have  an abdominal aortic aneurysm which, by ultrasound and CT, was 6.8 cm.  He  has had no symptoms such as abdominal or back pain.  He was considered for  aneurysm repair but in the interim, has undergone CABG x3 on September 05, 2002,  as well as a left upper lobe wedge resection of hemartoma.  He has  progressed well since that time and has returned for follow-up with Dr.  Madilyn Fireman for consideration of aneurysm repair at this time.   HOSPITAL COURSE:  He was admitted on December 03, 2002, and was taken to  the operating room where he underwent an aortobifemoral bypass graft with 16  x 8 mm Hemashield graft.  He  tolerated the procedure well and was  transferred to the SICU in stable condition.  He was able to be extubated in  the OR postoperatively and was transferred to the SICU in stable condition.   Postoperatively, he has done well.  He was hemodynamically stable and doing  well on postoperative day #1 and was able to be transferred to the floor.  His postoperative ABIs were greater than 1.0 bilaterally.  He has been  followed by cardiology and his Coumadin was restarted for atrial  fibrillation on postoperative day #3.  He has maintained sinus rhythm  throughout his admission and at the present, his INR is 1.3.   By postoperative day #2, he was developing bowel sounds and began passing  flatus.  On postoperative day #3, his diet was advanced to clear liquids.  He tolerated this well  and on postoperative day #4, was advanced to a  regular diet.  At the present he is tolerating a regular diet and is having  normal bowel function.  His surgical incision sites are healing well.  He  has been ambulating in the halls without difficulty and has been weaned off  supplemental oxygen maintaining O2 saturations of greater than 90% on room  air.   His most recent labs show hemoglobin of 13, hematocrit 38.8, white count  7.9, platelets 133.  Blood chemistries are within normal limits.   It is felt that since he has remained very stable and is progressing well,  he is ready for discharge home at this time.   DISCHARGE MEDICATIONS:  1. Coumadin 5 mg daily until his blood work is checked.  2. Lanoxin 0.125 mg daily.  3. Metoprolol 25 mg t.i.d.  4. Zocor 20 mg q.h.s.  5. Lasix 40 mg daily.  6. Glucophage 500 mg b.i.d.  7. Amiodarone 200 mg b.i.d.  8. Potassium chloride 20 mEq daily.  9. Tylox one to two q.4h. p.r.n. for pain.   DISCHARGE INSTRUCTIONS:  He is to refrain from driving, heavy lifting or  strenuous activity.  He may continue daily ambulation and use of his  incentive spirometer.  He  may shower daily and clean his incisions with soap  and water.   FOLLOW UP:  He is asked to have a PT and INR drawn at the [facility] on Wednesday, December 11, 2002, for further Coumadin management.  He will then follow up with Dr. Myrtis Ser as directed.  The [facility] office will  contact him with appointments for staple removal and wound recheck in one  week as well as an appointment with Dr. Madilyn Fireman with follow-up ABIs in three  weeks.  He is asked to call our office in the interim if he experiences any  problems or has any questions.      Coral Ceo, P.A.                        Balinda Quails, M.D.    GC/MEDQ  D:  12/08/2002  T:  12/09/2002  Job:  [id number]   cc:   Titus Dubin. Alwyn Ren, M.D. [facility]   [name], M.D.

## 2010-08-06 NOTE — Cardiovascular Report (Signed)
NAME:  [name], SORG NO.:  [id number]   MEDICAL RECORD NO.:  [id number]                   PATIENT TYPE:  OIB   LOCATION:  3735                                 FACILITY:  [facility]   PHYSICIAN:  Veneda Melter, M.D.                   DATE OF BIRTH:  07-29-1940   DATE OF PROCEDURE:  07/26/2002  DATE OF DISCHARGE:  07/27/2002                              CARDIAC CATHETERIZATION   PROCEDURES PERFORMED:  Selective coronary angiography.   CARDIOLOGIST:  Veneda Melter, M.D.   DIAGNOSIS:  Three vessel coronary artery disease.   HISTORY OF PRESENT ILLNESS:  The patient is a 70 year old gentleman with a  history of coronary artery disease by previous catheterization as well as  atrial fibrillation with tachycardia induced cardiomyopathy.  The patient  has peripheral vascular disease with abdominal aortic aneurysm that may  require surgical repair. He underwent lower extremity angiogram on Jul 25, 2002, by Dr. Madilyn Fireman and subsequently developed some substernal chest  discomfort.  He was admitted overnight to the hospital and stabilized  medically and ruled to for acute myocardial infarction.  He presents for  reassessment of his coronary anatomy.  Informed consent was obtained.   DESCRIPTION OF PROCEDURE:  The patient was brought to the catheterization  lab.  A 6 French sheath was placed in the right femoral artery using  modified Seldinger technique.  Using a Wooly, wire a JL4 diagnostic catheter  was advanced and used to engage the left coronary artery.  Selective  coronary angiography was performed in various projections using manual  injections of contrast.  The exchange line for the J catheter was then used  to insert an JL4 catheter which was used to selective engage the right  coronary artery.  Again, selective angiography was performed using manual  injections of contrast.  Subsequently, the catheters and sheaths were  removed and manual pressure applied  until adequate hemostasis was achieved.  The patient tolerated the procedure well and was transferred to the floor in  stable condition.   FINDINGS:  1. Left main trunk is a large caliber vessel with mild irregularities.  2. Left anterior descending:  This is a medium caliber vessel.  It provides     two small diagonal branches.  The LAD has mild disease of 30% in the     proximal segment.  There is then a long tubular narrowing of 60% after     the first diagonal branch as it comes to the second diagonal branch.  The     distal LAD has mild disease of 20-30%.  The first diagonal branch     bifurcates in its proximal segment and it has moderate diffuse disease of     50-60% in the proximal segment.  The second diagonal branch is a small     caliber vessel with diffuse disease of 70-80% of the proximal segment.  3.  Left circumflex artery:  This begins as a large caliber vessel.  It     provides a large marginal branch in the distal segment.  There is an AV     continuation that provides a second marginal branch.  The AV circumflex     to the proximal segment has a high grade narrowing of 70%.  The first     marginal branch has moderate plaque of 50% in the mid section. The second     marginal branch is 100% occluded proximally and it fills via collaterals     from the right coronary artery.  4. Right coronary artery is dominant.  It is a small caliber vessel that     supplies the posterior descending artery in its terminal segment.  The     right coronary artery has proximal narrowing of 30%.  There is a long     segment of diffuse disease of 60% in the mid section after the major RV     marginal branch.  The distal right coronary artery has mild diffuse     disease of 30%.  The posterior descending artery is a small caliber     vessel.  5. Left subclavian artery.  This vessel was patent with moderate disease of     30%.  The internal mammary artery is patent and of normal caliber.  It      extends to the diaphragm.   ASSESSMENT AND PLAN:  The patient is a 70 year old gentleman with moderate  to severe three vessel coronary artery disease.  This appears stable from  prior angiogram in November.  The patient has recently developed symptoms of  chest discomfort.  In addition, he needs major vascular surgery with repair  of his abdominal aortic aneurysm and would benefit from revascularization  prior to this procedure.  Angiograms reviewed and consideration given  towards percutaneous versus surgical intervention.                                                Veneda Melter, M.D.    NG/MEDQ  D:  07/26/2002  T:  07/29/2002  Job:  [id number]   cc:   [name], M.D.   Titus Dubin. Alwyn Ren, M.D. [facility]   Demetrius Charity Liliane Bade, M.D.  [address]  [place]  [place] [address]  Fax: [phone]

## 2010-08-06 NOTE — Op Note (Signed)
NAME:  [name], [name] NO.:  [id number]   MEDICAL RECORD NO.:  [id number]          PATIENT TYPE:  INP   LOCATION:  3029                         FACILITY:  [facility]   PHYSICIAN:  Stefani Dama, M.D.  DATE OF BIRTH:  04-10-1940   DATE OF PROCEDURE:  12/05/2005  DATE OF DISCHARGE:                                 OPERATIVE REPORT   PREOPERATIVE DIAGNOSES:  1. Lumbar lumbar spondylosis.  2. Lumbar stenosis with neurogenic claudication.   POSTOPERATIVE DIAGNOSES:  1. Lumbar lumbar spondylosis.  2. Lumbar stenosis with neurogenic claudication.   PROCEDURE:  X-Stop decompression L3-4 and L4-5 lumbar spine.   SURGEON:  Stefani Dama, M.D.   FIRST ASSISTANT:  None.   ANESTHESIA:  General endotracheal.   INDICATIONS:  [name] is a 70 year old individual who has had  significant problems with radicular symptoms consistent with neurogenic  claudication secondary to lumbar spinal stenosis most notably worse at the  L3-4 and the L4-5 levels.  The patient was treated conservatively.  He has  had some epidural injections.  He has not responded well to these and he has  had continued problems with worsening pain, particularly when he walks any  distance.  He has been advised regarding decompression formally with  laminectomy and arthrodesis. He has also been advised and has shown interest  in decompression via an X-Stop device.  The patient was advised that two  level X-Stop device would be required in his situation as he has worst  stenosis at the L3-4 and L4-5 levels.   PROCEDURE:  The patient was brought to the operating room supine on the  stretcher.  After smooth induction of general endotracheal anesthesia, he  was turned prone.  the back was prepped with DuraPrep and draped in a  sterile fashion.  Midline incision was created in the lower lumbar spine,  carried down to the lumbodorsal fascia which was opened on either side of  the midline to expose the  first spinous process which was identified  radiographically as that of L4.  The skin was infiltrated at this point with  20 mL of lidocaine 1% mixed 50/50 with 0.50% Marcaine and an additional 20  mL was placed in the lumbodorsal fascia and in the paraspinous musculature.  Subperiosteal dissection was then taken place at L4-5 and L3-4 and self-  retaining retractor was placed in the lumbar wound.  The interspinous space  at L3-4 was then opened using the small awl to enlarge an opening at L3-4.  This was enlarged slightly more with a larger awl and then the interspinous  distractor tool was placed in the space at L3-4 and distraction was  performed under fluoroscopic guidance.  The maximum distraction that could  be obtained was up to 12 mm under a fair amount of tension.  It was felt  that no bigger than the 10 mm X-Stop device would fit in this area.  Similar  procedure was carried out at L4-L5 and this space was similarly opened to  the 12 mm size and it was felt that no bigger than a 10 mm  X-Stop device  would fit in this area.  After both these areas were distracted and allowed  to set for a few minutes to allow ligamentotaxis, a10 mm spacer was prepared  and the first one was fitted in the L3-L4.  The lateral wing was applied and  torqued down to the appropriate specifications and then the X-Stop at L4-5  was placed from the right side to the left side.  This being also 10 mm in  size.  It was also torqued down to the final specifications.  The wound was  irrigated copiously with antibiotic irrigating solution.  The lumbodorsal  fascia was then closed with #1 Vicryl in interrupted fashion, 2-0 Vicryl was  used in the subcutaneous tissues, 3-0 Vicryl subcuticularly.  Dermabond was  placed on the skin.  Final localizing radiographs were obtained in the  operating room prior to closure and identified good positions of the X-Stops  low in the interspinous space at L3-4 and L4-5.       Stefani Dama, M.D.  Electronically Signed     HJE/MEDQ  D:  12/05/2005  T:  12/05/2005  Job:  [id number]

## 2010-08-06 NOTE — H&P (Signed)
NAME:  [name], [name]                       ACCOUNT NO.:  [id number]   MEDICAL RECORD NO.:  [id number]                   PATIENT TYPE:  OIB   LOCATION:  3735                                 FACILITY:  [facility]   PHYSICIAN:  Jesse Sans. Wall, M.D.                DATE OF BIRTH:  [birth year]   DATE OF ADMISSION:  07/25/2002  DATE OF DISCHARGE:                                HISTORY & PHYSICAL   REASON FOR ADMISSION:  We were asked by Dr. Balinda Quails to see this  patient for chest discomfort consistent with unstable angina.   HISTORY OF PRESENT ILLNESS:  The patient is well known to [place].  He is a 70-  year-old married white male who had a cardiac catheterization in November  2003.  At that time he had a 60% mid LAD, 50% diagonal I, 70% AV circumflex,  and a total obtuse marginal II.  He had a 60% long mid RCA lesion with  collaterals from the distal right to the distal circumflex.  EF was 35% with  2+ MR.   Today, he had elective peripheral angiogram for a known abdominal aortic  aneurysm of 6.8 cm by recent CT.  It appears that he is going to need  abdominal aortic aneurysm repair by open surgery versus stent.  He had some  postoperative chest pain which was relieved with sublingual nitroglycerin.   He had a bad episode requiring two sublingual nitroglycerin about six weeks  ago.   He also has peripheral vascular disease with ABI of 0.76 on left and 0.92 on  the right.   CURRENT MEDICATIONS:  1. Lanoxin 0.125 a day.  2. Imdur 60 mg a day.  3. Metoprolol 75 b.i.d.  4. Norvasc 10 mg a day.  5. Mavik 4 b.i.d.  6. Protonix 40 daily.  7. Zocor 20 q.h.s.  8. Coumadin.  9. Furosemide 40 daily.  10.      Clonidine 0.2 b.i.d.  11.      Indomethacin 50 p.r.n.  12.      Nitrostat p.r.n.   PAST MEDICAL HISTORY:  This is in the chart.   SOCIAL HISTORY:  The patient lives in [place], [place], with his  wife.  He has not smoked in seven years. He drinks two to three beers  per  day.   FAMILY HISTORY:  This is really noncontributory.   REVIEW OF SYSTEMS:  He has dyspnea on exertion and some lower extremity  edema and he also says his legs fatigue right surprisingly greater than left  with activity.   PHYSICAL EXAMINATION:  GENERAL APPEARANCE:  He is in no acute distress.  VITAL SIGNS:  Blood pressure 129/79, respiratory rate 18, heart rate between  45 and 65 and sinus rhythm.  He has a 94% O2 saturation on room air.  HEENT:  This is unremarkable.  NECK:  There are no carotid bruits with  good upstrokes.  There is no JVD.  LUNGS:  His lungs were relatively clear.  CARDIOVASCULAR:  There is an S4 with a soft systolic murmur.  ABDOMEN:  Abdomen is protuberant with palpable aorta but no bruit.  His  right groin is stable after his catheterization today.  EXTREMITIES:  His left femoral pulse is 2/4, right groin has a wedge that is  2/4 posterior tibials bilaterally.  There is no edema.  NEUROLOGIC:  Examination is intact.   EKG shows sinus bradycardia with nonspecific ST segment changes.   LABORATORY DATA:  This is otherwise unremarkable.  His INR is 1.5.  His  first set of cardiac enzymes were negative.   ASSESSMENT AND RECOMMENDATIONS:  1. Unstable angina.  I think with him facing a high risk abdominal aortic     aneurysm repair, with the fact also that he has a low ejection fraction,     repeat coronary catheterization with __________ only is indicated.  I     discussed this with the patient and his wife and they agreed.  Arrange     this tomorrow.  Hopefully his anatomy is stable and his angina is from     his distal circumflex territory with the collaterals from the right.  2. Left ventricular dysfunction.  3. Abdominal aortic aneurysm.  4. Peripheral vascular disease which is now symptomatic.  5. Hyperlipidemia.  6. Hypertension.  7. History of __________ atrial fibrillation status post DC cardioversion     January of 2004.  8. History of wide  complex tachycardia.  9. Hyperglycemia.                                               Thomas C. Wall, M.D.    TCW/MEDQ  D:  07/25/2002  T:  07/26/2002  Job:  [id number]   cc:   Titus Dubin. Alwyn Ren, M.D. [facility]   [name], M.D.

## 2010-08-06 NOTE — Op Note (Signed)
NAME:  [name], [name]                       ACCOUNT NO.:  [id number]   MEDICAL RECORD NO.:  [id number]                   PATIENT TYPE:  OIB   LOCATION:  2550                                 FACILITY:  [facility]   PHYSICIAN:  Almedia Balls. Ranell Patrick, M.D.              DATE OF BIRTH:  06/12/1940   DATE OF PROCEDURE:  11/10/2003  DATE OF DISCHARGE:                                 OPERATIVE REPORT   PREOPERATIVE DIAGNOSIS:  Right ankle bimalleolar fracture, displaced.   POSTOPERATIVE DIAGNOSIS:  Right ankle bimalleolar fracture, displaced.   PROCEDURE PERFORMED:  Right ankle open reduction and internal fixation of  bimalleolar fracture.   ATTENDING SURGEON:  Almedia Balls. Ranell Patrick, M.D.   ASSISTANT:  Donnie Coffin. Durwin Nora, P.A.   General anesthesia was used.  Estimated blood loss minimal.  Tourniquet time  was 56 minutes.  Instrument count was correct.  There were no complications.  Perioperative antibiotics given.   INDICATIONS:  The patient is a 70 year old male status post a ground-level  fall, twisting his right ankle, who had immediate pain and swelling.  He  presented to orthopedics as an outpatient complaining of persistent pain in  the ankle.  Radiographs demonstrated a displaced bimalleolar ankle fracture  with an unstable ankle mortise.  Based on the unstable nature of this  injury, the patient was counseled on the need for operative reduction of  this fracture.  The patient agreed and consent was obtained.   DESCRIPTION OF PROCEDURE:  After an adequate level of anesthesia was  achieved, the patient was positioned supinely on the operating room table.  A nonsterile tourniquet was placed on the right proximal thigh, right leg  elevated, and sterilely prepped and draped.  He was then exsanguinated using  an Esmarch bandage, tourniquet elevated to 300 mmHg.  A longitudinal skin  incision was created laterally over the subcutaneous border of the fibula.  Dissection was carried sharply down  through subcutaneous tissues.  The  fracture site was easily identified, soft tissues were removed from the  fracture site, and the fracture reduced using crab claw clamp, and then a  single anterior to posterior lag screw was placed using AO technique.  This  gained excellent compression of the fracture site and anatomic reduction  verified under multiplanar views using the C-arm.  At this point a six-hole  one-third tubular plate was placed laterally as a neutralization plate with  two fully-threaded cancellous screws distally and three bicortical 3.5 mm  screws proximally.  Again anatomic placement of the plate and anatomic  fracture reduction was identified.  Attention was then directed toward the  medial side of the ankle, where a small curvilinear incision was created,  facilitating exposure of the medial malleolus.  Soft tissues cleaned from  the medial malleolus fracture site as well as the joint irrigated and  lavaged and inspected for debris, none was noted.  The lateral side of the  joint  was lavaged as well prior to fixing it.  At this point the medial  malleolus was anatomically reduced, held in place with provisional K-wires,  followed by two 45 mm 4.0 malleolar screws, getting excellent compression  and good fixation.  These were checked again in multiple views.  The ankle  mortise was anatomically reduced.  At this point  the wounds were closed using layered closure with Vicryl and staples.  Sterile dressings applied, followed by a short-leg splint, the patient's  foot in neutral.  He was taken to the recovery room in stable condition.  The tourniquet was deflated at 56 minutes.                                               Almedia Balls. Ranell Patrick, M.D.    SRN/MEDQ  D:  11/10/2003  T:  11/11/2003  Job:  [id number]

## 2010-08-06 NOTE — Cardiovascular Report (Signed)
NAME:  [name], [name]                       ACCOUNT NO.:  [id number]   MEDICAL RECORD NO.:  [id number]                   PATIENT TYPE:  OIB   LOCATION:  6531                                 FACILITY:  [facility]   PHYSICIAN:  Veneda Melter, M.D.                   DATE OF BIRTH:  1940/06/17   DATE OF PROCEDURE:  08/08/2002  DATE OF DISCHARGE:                              CARDIAC CATHETERIZATION   PROCEDURES PERFORMED:  1. Selective coronary angiography.  2. Primary stent placement AV circumflex.   DIAGNOSES:  Three vessel coronary artery disease.   HISTORY:  The patient is a 70 year old gentleman with known history of  coronary disease and LV dysfunction.  He has had abdominal aortic aneurysm  of 6.8 cm that required surgical repair.  The patient recently underwent  lower extremity angiogram.  Subsequently developed chest discomfort  prompting cardiac catheterization showing three vessel coronary artery  disease.  After further review of his angiogram we elected to proceed with  percutaneous intervention to the AV circumflex prior to risk stratification.   TECHNIQUE:  Informed consent was obtained.  The patient was brought to the  catheterization laboratory.  A 6-French sheath placed in the right femoral  artery using modified Seldinger technique.  A CLS4 guide catheter was then  used to engage the left coronary artery and selective guide shots obtained.  This confirmed the presence of a high grade narrowing of 70% in the proximal  AV circumflex.  There was then a large marginal branch with narrowing of 50-  60% of the proximal segment.  A second marginal branch was occluded and  fills via collaterals from the right coronary artery.  Selective angiography  of the right coronary artery showed this to be a mean caliber vessel with  long 70% in the mid section between two RV marginal branches.  There is then  focal narrowing 50-60% in the distal section prior to the PDA.  Posterior  descending artery and post left ventricular branches are patent and  collateral flow to the distal circumflex is noted.   We elected to proceed with percutaneous intervention to the AV circumflex.  The patient had been pretreated with aspirin and Plavix and was given  heparin to maintain ACT of greater than 225 seconds.  A 0.014 inch Forte  wire was advanced into the mid AV circumflex and selective guide shots  obtained to size lesional length and vessel diameter.  This wire was then  advanced from the distal circumflex.  A 3.5 x 12 mm Express-2 stent was  introduced, carefully positioned in the proximal AV circumflex and deployed  at 14 atmospheres for 60 seconds.  Repeat angiography showed excellent  result with full coverage of the lesion, no vessel damage, and TIMI 3 flow  through the distal vessel.  The guide catheter and wire were removed and  sheath secured in position.  The patient tolerated procedure  well.  Was  transferred to the floor in stable condition.   FINAL RESULTS:  Successful primary stent placement proximal AV circumflex  with reduction of 70% narrowing to 0%, placement of 3.5 x 12 mm Express-2  stent.   ASSESSMENT/PLAN:  The patient is a 70 year old gentleman with three vessel  coronary artery disease and moderate left ventricular dysfunction who has  undergone percutaneous intervention AV circumflex to improve flow to a large  marginal branch that supplies the lateral wall.  He will be risk stratified  with Adenosine Cardiolite on  maximum medication.  To return if he has any significant ischemia in the  left anterior descending or right coronary artery territory.  Should he have  ischemia, percutaneous intervention will then be considered.  Otherwise, he  will proceed with aneurysm repair with the understanding that he is at  increased risk for cardiovascular events.                                               Veneda Melter, M.D.    Melton Alar  D:  08/08/2002   T:  08/08/2002  Job:  [id number]   cc:   Titus Dubin. Alwyn Ren, M.D. [facility]   [name], M.D.   Balinda Quails, M.D.  [address]  [place]  [place] [address]  Fax: [phone]

## 2010-08-06 NOTE — Op Note (Signed)
NAME:  [name], [name] NO.:  [id number]   MEDICAL RECORD NO.:  [id number]          PATIENT TYPE:  OIB   LOCATION:  2899                         FACILITY:  [facility]   PHYSICIAN:  [name], M.D.     DATE OF BIRTH:  [birth year]   DATE OF PROCEDURE:  DATE OF DISCHARGE:  10/11/2005                                 OPERATIVE REPORT   CARDIOVERSION NOTE:  The patient has recurrent atrial fib.  After reload with amiodarone, he is  now to be cardioverted.   With Anesthesia present, the patient received 350 mg of IV Pentothal.  Anterior-posterior pads were placed.  He received 120 joules of biphasic  energy and remained in atrial fib.  With 200 joules of biphasic energy, he  converted to sinus rhythm.  A 12-lead is pending at this time.   The patient tolerated the procedure well.  I have spoken to him and his  wife.  I will see him back for early followup in the office.           ______________________________  [name], M.D.     JK/MEDQ  D:  10/11/2005  T:  10/12/2005  Job:  [id number]

## 2010-08-06 NOTE — Discharge Summary (Signed)
NAME:  [name], [name] NO.:  [id number]   MEDICAL RECORD NO.:  [id number]                   PATIENT TYPE:  INP   LOCATION:  4709                                 FACILITY:  [facility]   PHYSICIAN:  Rene Paci, M.D. [name]          DATE OF BIRTH:  1940/05/17   DATE OF ADMISSION:  02/06/2002  DATE OF DISCHARGE:                                 DISCHARGE SUMMARY   ADMISSION DIAGNOSES:  1. Chest pain.  2. New onset atrial fibrillation.   DISCHARGE DIAGNOSES:  1. Atrial fibrillation, rate controlled on Coumadin.  2. Coronary artery disease with medical management.  3. Left atrial clot on Coumadin.  4. Wide complex beats.  5. Left ventricular dysfunction followed with rate control and limited     alcohol.  6. Gout.   DISCHARGE MEDICATIONS:  1. Lasix 40 mg p.o. q.d.  2. Protonix 40 mg q.d.  3. Mavik 4 mg q.d.  4. Foltx.  5. Zocor 20 mg q.h.s.  6. Aspirin 325 mg q.d.  7. Lovenox 115 subcu q.12h.  8. Imdur 30 mg q.d.  9. Lopressor 100 mg b.i.d.  10.      Verapamil 240 mg q.d.  11.      Digoxin 0.125 mg q.d.  12.      Coumadin 10 mg p.o. q.d.   FOLLOW-UP:  Coumadin clinic check on Friday, February 15, 2002, at 10:30  a.m.  Follow-up appointment with [name], M.D., on Friday, February 22, 2002.   LABORATORY DATA:  The PT and INR on February 12, 2002, were 13.5 and 1.0,  respectively.  The CBC was normal.  The cardiac panel remained normal.  TSH  0.750.  Lipid profile with cholesterol 185, triglycerides 94, HDL 68, and  LDL 98.  C-reactive protein 2.390, falling into the average risk category.  Hepatic function normal with a slightly low protein at 5.7 and albumin 3.4.  The chest x-ray showed cardiomegaly with pulmonary vascular engorgement,  mild right basilar atelectasis, and COPD.  The TEE on February 11, 2002,  showed left atrial thrombus.  The 2-D echocardiogram on February 07, 2002,  showed EF between 30-40%.   HISTORY OF PRESENT  ILLNESS:  The patient was admitted to the hospital on  February 06, 2002, to rule out an MI.  He had described his condition as  profound weakness and loss of strength in his legs.  The symptoms were  ongoing for three to four days and associated with abdominal pain,  progressive shortness of breath and chest congestion.  He was seen by his  primary M.D. and he declined hospitalization.  On February 06, 2002, he had  worsening of the chest pain and was found to be in atrial fibrillation.   HOSPITAL COURSE:  Cardiology was consulted.  He had a cardiac  catheterization on February 08, 2002, where they found moderate three-vessel  coronary artery disease, moderate left ventricular systolic function, and  atrial fibrillation.  The plan was to consider cardioversion, however, in  his TEE a thrombus was located in his left atrium.  Therefore the plan was  to attempt to dissolve the clot with the Coumadin.  Follow-up plans are as  noted above with the Coumadin clinic and [name], M.D., and then again  reconsult EP once the clot is dissolved for further management of the atrial  fibrillation.  The patient progressively felt better during his stay.  On  February 11, 2002, he complained of gouty-like pain in his right ankle.  We  held the colchicine pending further cardiology evaluation.  On February 12, 2002, he was given Indocin x 1 to assist him with his gouty pain.  By  February 12, 2002, it was decided that cardiac work-up within the hospital  was complete.  It was felt that he could be discharged to go home once the  Lovenox and Coumadin were arranged.  There was the additional concern of his  alcohol consumption.  He was advised that he must limit his alcohol  consumption.  At this point, he maintains an irregular heartbeat, feels  well, and is stable for discharge home.     Governor Specking, N.P. [facility]             Rene Paci, M.D. [facility]    TJ/MEDQ  D:  02/12/2002  T:   02/12/2002  Job:  [id number]   cc:   [name], M.D. [facility]   Titus Dubin. Alwyn Ren, M.D. [facility]

## 2010-08-06 NOTE — H&P (Signed)
NAME:  [name], [name] NO.:  [id number]   MEDICAL RECORD NO.:  [id number]                   PATIENT TYPE:  INP   LOCATION:  4709                                 FACILITY:  [facility]   PHYSICIAN:  Titus Dubin. Alwyn Ren, M.D. [facility]         DATE OF BIRTH:  [birth year]   DATE OF ADMISSION:  02/06/2002  DATE OF DISCHARGE:                                HISTORY & PHYSICAL   HISTORY OF PRESENT ILLNESS:  [name] is a 70 year old white  male admitted with chest pain and atrial fibrillation.   He was initially seen related to this problem on November 6th.  He described  symptoms suggestive of gastroenteritis with diarrhea, diaphoresis, weakness,  nausea and vomiting.  He had profound weakness with loss of strength in his  legs.  He had had symptoms for three or four days associated with abdominal  pain.  On October 31st, he had experienced malaise which progressed over the  next two days.  Diarrhea began as of November 3rd.  He described 7-8 bowel  movements that day.  He also had some chest congestion and fullness the  weekend prior to the event but not at the time of the visit.  He also did  have some shortness of breath.  He states that the diarrhea did improve  somewhat from November 4th until November 6th.  His therapy consisted of  taking aspirin.   He was afebrile but appeared uncomfortable and relatively acutely ill.  Blood pressure was 160/110 at that time.  Heart rate was irregular with  atrial fibrillation with a rate of 89.  Urinalysis was negative.  He was  encouraged to enter the hospital but declined.  He was asked to stay in town  until he was stable.  He typically works out of town, returning for the  weekends.   Because of his declining to enter the hospital, he was placed on Cipro 500  mg twice a day and Lomotil 2.5 mg as needed.  Labetalol of 300 mg was  increased from 1/2 twice a day to one twice a day.   I had instructed him to  come in to be rechecked on the 7th for lab work and  to be seen.  He did come in for labs but did not stay for an office visit.  Labs at that time revealed a normal white count and differential.  A basic  metabolic profile was normal except for a glucose of 153.  He was asked to  return to the office to monitor the atrial fibrillation.  By this time, he  was out of town and did not return until the day of admission.   He had been having chest pain as of Monday evening, the 17th, intermittently  through the night until the 18th.  He also had racing associated with it as  well as nausea and sweating.  The chest pain was described as  dull and left-  sided and intermittent through the night.  He also had diaphoresis without  nausea.  He also had some chest pain the morning of the visit to the office,  November 19th.  He had had pain intermittently from 2 a.m. November 19th  until 10 a.m.   He was found to be in atrial fibrillation with paroxysmal supraventricular  tachycardia upon rhythm strip.  Because of the dysrhythmia and chest pain,  hospitalization was forced.   PAST SURGICAL HISTORY:  1. Cervical spine surgery.  2. Surgery on the scrotum for varicose veins as a child.  3. Pilonidal cystectomy.  4. He was hospitalized for 10 months related to hip problems in the context     of Legg-Perthes disease.   PAST MEDICAL HISTORY:  1. Hypertension.  2. Hyperglycemia.  He has had a normal hemoglobin A1C in the past.  3. Chest x-ray has been abnormal with cardiomegaly.  Chronic bronchitic     changes have been suggested.  4. 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   FAMILY HISTORY:  Negative for malignancy.  There is history of stroke in his  mother, diabetes in his father, and stroke in a grandfather.   SOCIAL HISTORY:  He quit smoking in 1997.  He drinks 3-7 beers per day.  He  does not exercise.  He  describes his diet as regular.  Additional social  history reveals the water he uses in the city.  He drinks only bottled  water.  He has had no trouble other than the work and has no sick pets.   MEDICATIONS:  1. Tarka 4/240 mg daily.  2. Vioxx 25 mg as needed.  3. Labetalol 300 mg twice a day.  4. Lasix 40 mg daily.   INTOLERANCES/ALLERGIES:  PENICILLIN.   REVIEW OF SYSTEMS:  Is noted above.  He also had some dyspepsia with the  gastroenteritis.  He also had a hemorrhoidal flare with that illness.   He had cellulitis in June of this year in the right foot related to a  foreign body.   He has had edema in the past.   In August of 2003, he was found to have diverticulosis and colon polyps and  hemorrhoids on colonoscopy.   He underwent an exercise Cardiolite study in February of 2001 because of  multiple risk factors with EKG revealing frequent PVCs.  This was an  abnormal stress test due to impaired exercise capacity and hypertensive  response.  There was no definite EKG of scintigraphic evidence for  myocardial ischemia or infarction.   PHYSICAL EXAMINATION:  GENERAL:  He is in no acute distress.  VITAL SIGNS:  Weight was 258 which was up seven pounds from November 6th.  Pulse was 100 and irregular.  Respiratory rate 22 and blood pressure 138/96.  Significantly, blood pressure was 160/110 when seen November 6th.  He had  arterial narrowing.  He appears deconditioned.  HEENT:  The tympanic membranes were scarred.  He has complete dentures.  NECK:  There is an operative scar over the left neck.  There is prominence  of the left clavicular head.  LUNGS:  Breath sounds are decreased.  CARDIAC:  Heart rate is irregular with a flow murmur.  EXTREMITIES:  Posterior tibial pulses are decreased.  He has trace edema.  ABDOMEN:  Protuberant without organomegaly or masses.  GENITOURINARY  AND RECTAL:  Not repeated as these were done earlier this year and are not germane to this  admission.  He does have a ventral hernia.  NEUROLOGICAL:  There are no localizing neurologic signs.  MUSCULOSKELETAL:  He has no significant arthritic changes except for  crepitus in his knees.    IMPRESSION:  He is now admitted with chest pain with new onset atrial  fibrillation.   PLAN:  He will be admitted to telemetry with cardiac enzymes.  A cardiology  consultation will be pursued.                                               Titus Dubin. Alwyn Ren, M.D. [facility]    WFH/MEDQ  D:  02/06/2002  T:  02/06/2002  Job:  [id number]

## 2010-08-11 NOTE — Assessment & Plan Note (Signed)
His device has reached ERI. I have recommended ICD generator change.

## 2010-08-11 NOTE — Assessment & Plan Note (Signed)
His symptoms are mostly class 3A. He will continue his current meds and maintain a low sodium diet.

## 2010-08-13 ENCOUNTER — Other Ambulatory Visit (INDEPENDENT_AMBULATORY_CARE_PROVIDER_SITE_OTHER): Payer: Medicare Other | Admitting: *Deleted

## 2010-08-13 DIAGNOSIS — I4891 Unspecified atrial fibrillation: Secondary | ICD-10-CM

## 2010-08-13 DIAGNOSIS — I5022 Chronic systolic (congestive) heart failure: Secondary | ICD-10-CM

## 2010-08-13 LAB — PROTIME-INR
INR: 2.5 ratio — ABNORMAL HIGH (ref 0.8–1.0)
Prothrombin Time: 25.7 s — ABNORMAL HIGH (ref 10.2–12.4)

## 2010-08-13 LAB — BASIC METABOLIC PANEL
BUN: 20 mg/dL (ref 6–23)
Calcium: 8.9 mg/dL (ref 8.4–10.5)
Creatinine, Ser: 1.4 mg/dL (ref 0.4–1.5)

## 2010-08-13 LAB — CBC WITH DIFFERENTIAL/PLATELET
Basophils Absolute: 0 10*3/uL (ref 0.0–0.1)
Basophils Relative: 0.4 % (ref 0.0–3.0)
Eosinophils Absolute: 0 10*3/uL (ref 0.0–0.7)
HCT: 42.9 % (ref 39.0–52.0)
Hemoglobin: 14.5 g/dL (ref 13.0–17.0)
Lymphs Abs: 1 10*3/uL (ref 0.7–4.0)
MCHC: 33.9 g/dL (ref 30.0–36.0)
MCV: 95.2 fl (ref 78.0–100.0)
Monocytes Absolute: 0.7 10*3/uL (ref 0.1–1.0)
Neutro Abs: 7.8 10*3/uL — ABNORMAL HIGH (ref 1.4–7.7)
RBC: 4.51 Mil/uL (ref 4.22–5.81)
RDW: 17.3 % — ABNORMAL HIGH (ref 11.5–14.6)

## 2010-08-18 ENCOUNTER — Encounter: Payer: Self-pay | Admitting: *Deleted

## 2010-08-20 ENCOUNTER — Ambulatory Visit (INDEPENDENT_AMBULATORY_CARE_PROVIDER_SITE_OTHER): Payer: Self-pay | Admitting: Cardiology

## 2010-08-20 ENCOUNTER — Telehealth: Payer: Self-pay | Admitting: Internal Medicine

## 2010-08-20 DIAGNOSIS — R0989 Other specified symptoms and signs involving the circulatory and respiratory systems: Secondary | ICD-10-CM

## 2010-08-20 NOTE — Telephone Encounter (Signed)
Spoke with wife aware what time to be at the hospital

## 2010-08-20 NOTE — Telephone Encounter (Signed)
Pt wife has question re pt surgery on Monday. Pt wife would like to talk to kelly.

## 2010-08-23 ENCOUNTER — Ambulatory Visit (HOSPITAL_COMMUNITY)
Admission: RE | Admit: 2010-08-23 | Discharge: 2010-08-23 | Disposition: A | Discharge status: Home / Self Care | Payer: Medicare Other | Source: Ambulatory Visit | Attending: Internal Medicine | Admitting: Internal Medicine

## 2010-08-23 ENCOUNTER — Other Ambulatory Visit: Payer: Self-pay | Admitting: Internal Medicine

## 2010-08-23 DIAGNOSIS — I10 Essential (primary) hypertension: Secondary | ICD-10-CM | POA: Insufficient documentation

## 2010-08-23 DIAGNOSIS — I509 Heart failure, unspecified: Secondary | ICD-10-CM | POA: Insufficient documentation

## 2010-08-23 DIAGNOSIS — M109 Gout, unspecified: Secondary | ICD-10-CM | POA: Insufficient documentation

## 2010-08-23 DIAGNOSIS — I080 Rheumatic disorders of both mitral and aortic valves: Secondary | ICD-10-CM | POA: Insufficient documentation

## 2010-08-23 DIAGNOSIS — I872 Venous insufficiency (chronic) (peripheral): Secondary | ICD-10-CM | POA: Insufficient documentation

## 2010-08-23 DIAGNOSIS — J449 Chronic obstructive pulmonary disease, unspecified: Secondary | ICD-10-CM | POA: Insufficient documentation

## 2010-08-23 DIAGNOSIS — Z4502 Encounter for adjustment and management of automatic implantable cardiac defibrillator: Secondary | ICD-10-CM | POA: Insufficient documentation

## 2010-08-23 DIAGNOSIS — J4489 Other specified chronic obstructive pulmonary disease: Secondary | ICD-10-CM | POA: Insufficient documentation

## 2010-08-23 DIAGNOSIS — E119 Type 2 diabetes mellitus without complications: Secondary | ICD-10-CM | POA: Insufficient documentation

## 2010-08-23 DIAGNOSIS — I2510 Atherosclerotic heart disease of native coronary artery without angina pectoris: Secondary | ICD-10-CM | POA: Insufficient documentation

## 2010-08-23 DIAGNOSIS — I472 Ventricular tachycardia: Secondary | ICD-10-CM

## 2010-08-23 DIAGNOSIS — I2589 Other forms of chronic ischemic heart disease: Secondary | ICD-10-CM | POA: Insufficient documentation

## 2010-08-23 DIAGNOSIS — E785 Hyperlipidemia, unspecified: Secondary | ICD-10-CM | POA: Insufficient documentation

## 2010-08-23 DIAGNOSIS — Z7901 Long term (current) use of anticoagulants: Secondary | ICD-10-CM | POA: Insufficient documentation

## 2010-08-23 LAB — BASIC METABOLIC PANEL
BUN: 24 mg/dL — ABNORMAL HIGH (ref 6–23)
CO2: 27 mEq/L (ref 19–32)
Calcium: 9.2 mg/dL (ref 8.4–10.5)
Chloride: 104 mEq/L (ref 96–112)
Creatinine, Ser: 1.09 mg/dL (ref 0.4–1.5)

## 2010-08-23 LAB — PROTIME-INR: Prothrombin Time: 24.5 seconds — ABNORMAL HIGH (ref 11.6–15.2)

## 2010-08-23 LAB — CBC
MCH: 31.6 pg (ref 26.0–34.0)
MCHC: 34.1 g/dL (ref 30.0–36.0)
MCV: 92.7 fL (ref 78.0–100.0)
Platelets: 173 10*3/uL (ref 150–400)
RBC: 4.78 MIL/uL (ref 4.22–5.81)

## 2010-08-24 ENCOUNTER — Encounter: Payer: Medicare Other | Admitting: *Deleted

## 2010-08-26 ENCOUNTER — Other Ambulatory Visit (HOSPITAL_COMMUNITY): Payer: Self-pay | Admitting: Neurological Surgery

## 2010-08-26 DIAGNOSIS — M47816 Spondylosis without myelopathy or radiculopathy, lumbar region: Secondary | ICD-10-CM

## 2010-08-31 ENCOUNTER — Other Ambulatory Visit: Payer: Self-pay | Admitting: Internal Medicine

## 2010-09-01 NOTE — Telephone Encounter (Signed)
Patient needs to schedule a f/u as indicated on labs from 06/2010

## 2010-09-02 ENCOUNTER — Other Ambulatory Visit: Payer: Self-pay | Admitting: Internal Medicine

## 2010-09-02 ENCOUNTER — Ambulatory Visit (INDEPENDENT_AMBULATORY_CARE_PROVIDER_SITE_OTHER): Payer: Medicare Other | Admitting: *Deleted

## 2010-09-02 DIAGNOSIS — I428 Other cardiomyopathies: Secondary | ICD-10-CM

## 2010-09-02 DIAGNOSIS — I5023 Acute on chronic systolic (congestive) heart failure: Secondary | ICD-10-CM

## 2010-09-02 NOTE — Progress Notes (Signed)
ICD CHECK IN CLINIC

## 2010-09-08 ENCOUNTER — Telehealth: Payer: Self-pay | Admitting: *Deleted

## 2010-09-08 NOTE — Telephone Encounter (Signed)
Per pt's wife pt needs myelogram on 6/29 needed clearance to hold coumadin 7 days prior ok per Dr Myrtis Ser pt will restart on 6/29 and will come for cvrr on Mon 6/9.  Pt's wife also states that pt has been having swelling in his legs has has been taking an extra 40 mg of Lasix daily off/on for past 2 weeks will let Dr Myrtis Ser know

## 2010-09-09 NOTE — Telephone Encounter (Signed)
Okay, keep [place] informed about the swelling

## 2010-09-17 ENCOUNTER — Ambulatory Visit (HOSPITAL_COMMUNITY)
Admission: RE | Admit: 2010-09-17 | Discharge: 2010-09-17 | Disposition: A | Discharge status: Home / Self Care | Payer: Medicare Other | Source: Ambulatory Visit | Attending: Neurological Surgery | Admitting: Neurological Surgery

## 2010-09-17 DIAGNOSIS — M549 Dorsalgia, unspecified: Secondary | ICD-10-CM | POA: Insufficient documentation

## 2010-09-17 DIAGNOSIS — M79609 Pain in unspecified limb: Secondary | ICD-10-CM | POA: Insufficient documentation

## 2010-09-17 DIAGNOSIS — M47816 Spondylosis without myelopathy or radiculopathy, lumbar region: Secondary | ICD-10-CM

## 2010-09-17 DIAGNOSIS — Z981 Arthrodesis status: Secondary | ICD-10-CM | POA: Insufficient documentation

## 2010-09-17 LAB — GLUCOSE, CAPILLARY

## 2010-09-17 MED ORDER — IOHEXOL 180 MG/ML  SOLN
14.0000 mL | Freq: Once | INTRAMUSCULAR | Status: AC | PRN
  Administered 2010-09-17: 14 mL via INTRATHECAL

## 2010-09-27 ENCOUNTER — Ambulatory Visit (INDEPENDENT_AMBULATORY_CARE_PROVIDER_SITE_OTHER): Payer: Medicare Other | Admitting: *Deleted

## 2010-09-27 ENCOUNTER — Encounter: Payer: Self-pay | Admitting: Internal Medicine

## 2010-09-27 ENCOUNTER — Ambulatory Visit (INDEPENDENT_AMBULATORY_CARE_PROVIDER_SITE_OTHER): Payer: Medicare Other | Admitting: Internal Medicine

## 2010-09-27 DIAGNOSIS — I10 Essential (primary) hypertension: Secondary | ICD-10-CM

## 2010-09-27 DIAGNOSIS — E119 Type 2 diabetes mellitus without complications: Secondary | ICD-10-CM

## 2010-09-27 DIAGNOSIS — Z7901 Long term (current) use of anticoagulants: Secondary | ICD-10-CM

## 2010-09-27 DIAGNOSIS — E785 Hyperlipidemia, unspecified: Secondary | ICD-10-CM

## 2010-09-27 DIAGNOSIS — I4891 Unspecified atrial fibrillation: Secondary | ICD-10-CM

## 2010-09-27 LAB — MICROALBUMIN / CREATININE URINE RATIO
Creatinine,U: 231.2 mg/dL
Microalb, Ur: 1.6 mg/dL (ref 0.0–1.9)

## 2010-09-27 LAB — POCT INR: INR: 2

## 2010-09-27 LAB — HEMOGLOBIN A1C: Hgb A1c MFr Bld: 7 % — ABNORMAL HIGH (ref 4.6–6.5)

## 2010-09-27 MED ORDER — SIMVASTATIN 40 MG PO TABS: 40.0000 mg | ORAL_TABLET | Freq: Every day | ORAL | Status: DC

## 2010-09-27 MED ORDER — GLIMEPIRIDE 2 MG PO TABS: 2.0000 mg | ORAL_TABLET | Freq: Every day | ORAL | Status: DC

## 2010-09-27 NOTE — Progress Notes (Signed)
Subjective:    Patient ID: [name], male    DOB: 19-May-1940, 70 y.o.   MRN: [id number]  HPI Diabetes status assessment: Fasting or morning glucose range:  120-150 or average :  136  . Highest glucose 2 hours after any meal:  Not checked. Hypoglycemia :  no .                                                     Excess thirst :no;  Excess hunger:  no ;  Excess urination:  no.                                  Lightheadedness with standing:  no. Chest pain:  no ; Palpitations :no( defibrillator replaced 05/12) ;  Pain in  calves with walking:  no .                                                                                                                                 Non healing skin  ulcers or sores,especially over the feet:  no. Numbness or tingling or burning in feet : RLE from knee down.S/P myelogram by Dr Danielle Dess .                                                                                                                                              Significant change in  Weight : up 6#. Vision changes : cataract surgery bilaterally in past 6 weeks  .                                                                    Exercise : unable  . Nutrition/diet:  No plan. Medication compliance : yes. Medication adverse  Effects:  no . Foot care : 18 mos ago.  A1c/ urine microalbumin monitor:  ? 09/11  Review of Systems     Objective:   Physical Exam he is in no acute distress. Skin reveals tanning but no significant lesions.  Breath sounds are decreased but there is no increased work of breathing.  He has an essentially regular rhythm with occasional extra beat. He states he  Is usually in AF. Clinically AF  is not  present No carotid bruits are noted. Radial artery pulses are slightly decreased. Pedal pulses are also decreased.  He does have 1+ pitting edema bilaterally.  There is decrease sensation to light touch over the sole of the left foot compared to  the right.        Assessment & Plan:  #1 diabetes, questionable control  # 2 Dyslipidemia  Plan: A1c and urine microalbumin.

## 2010-09-27 NOTE — Patient Instructions (Signed)
Preventive Health Care: Eat a low-fat diet with lots of fruits and vegetables, up to 7-9 servings per day. Avoid obesity; your goal is waist measurement < 40 inches.Consume less than 40 grams of sugar per day from foods & drinks with High Fructose Corn Sugar as #2,3 or # 4 on label.

## 2010-10-07 NOTE — Op Note (Signed)
  NAME:  [name], [name] NO.:  [id number]  MEDICAL RECORD NO.:  [id number]  LOCATION:  [facility]                         FACILITY:  [facility]  PHYSICIAN:  Doylene Canning. Ladona Ridgel, MD    DATE OF BIRTH:  1940/12/24  DATE OF PROCEDURE:  08/23/2010 DATE OF DISCHARGE:  08/23/2010                              OPERATIVE REPORT   PROCEDURE PERFORMED:  Removal of previously implanted question biventricular defibrillator and insertion of a new biventricular implantable cardioverter-defibrillator with defibrillation threshold testing.  SURGEON:  Doylene Canning. Ladona Ridgel, MD  INTRODUCTION:  The patient is a very pleasant 70 year old male with ischemic as well as nonischemic cardiomyopathy, congestive heart failure, atrial fibrillation, and ventricular tachycardia.  The patient is status post BiV ICD insertion.  He is now referred for ICD generator removal with insertion of a new device.  PROCEDURE:  After informed consent was obtained, the patient was taken to the Diagnostic EP Lab in the fasting state.  After usual preparation and draping, intravenous fentanyl and midazolam was given for sedation. 30 mL of lidocaine was infiltrated into the right infraclavicular region.  A 6-cm incision was carried out over this region. Electrocautery was utilized to dissect down to the fascial plane.  The right-sided ICD pocket was entered with electrocautery and gentle traction was utilized to remove the ICD system after electrocautery was utilized to remove the fibrous adhesions.  The pocket was irrigated. The defibrillator was disconnected from the leads and leads were evaluated and found to be working satisfactorily.  The new Medtronic BiV ICD [employer] CRT-D, serial number [id number]) was connected to the atrial, RV, and LV leads and placed back in the subcutaneous pocket. After the device was demonstrated to be working satisfactorily, defibrillation threshold testing was carried out.  After the patient  was more deeply sedated with fentanyl and Versed, VF was induced with T-wave shock.  A 20-joule shock was subsequently delivered which terminated VF and restored atrial fibrillation.  At this point, no additional defibrillation threshold testing was carried out and the incision was closed with 2-0 and 3-0 Vicryl.  Benzoin and Steri- Strips were painted on the skin, pressure dressing was applied, and the patient was returned to his room in satisfactory condition.  COMPLICATIONS:  There were no immediate procedure complications.  RESULTS:  This demonstrated successful removal of previously implanted Medtronic BiV ICD, insertion of a new BiV ICD with defibrillation threshold testing.     Doylene Canning. Ladona Ridgel, MD     GWT/MEDQ  D:  08/23/2010  T:  08/24/2010  Job:  [id number]  cc:   [name], MD, [facility]  Electronically Signed by [name] MD on 10/07/2010 08:31:26 AM

## 2010-10-13 ENCOUNTER — Telehealth: Payer: Self-pay | Admitting: Cardiology

## 2010-10-13 ENCOUNTER — Other Ambulatory Visit: Payer: Self-pay | Admitting: *Deleted

## 2010-10-13 MED ORDER — FUROSEMIDE 40 MG PO TABS: 40.0000 mg | ORAL_TABLET | Freq: Two times a day (BID) | ORAL | Status: DC

## 2010-10-13 NOTE — Telephone Encounter (Signed)
Pt needs Lasix filled.  Please call 90 d with 3 refill 3 per day into [facility]/[place]/  [phone]./ Phar states this was faxed on Monday.  No reply

## 2010-10-13 NOTE — Telephone Encounter (Signed)
Refill request sent to [facility] pharmacy 90 day supply plus 3 refills given. Vikki Ports

## 2010-10-20 ENCOUNTER — Encounter: Payer: Self-pay | Admitting: Cardiology

## 2010-10-29 ENCOUNTER — Encounter: Payer: Self-pay | Admitting: Cardiology

## 2010-10-29 ENCOUNTER — Ambulatory Visit (INDEPENDENT_AMBULATORY_CARE_PROVIDER_SITE_OTHER): Payer: Medicare Other | Admitting: *Deleted

## 2010-10-29 ENCOUNTER — Ambulatory Visit (INDEPENDENT_AMBULATORY_CARE_PROVIDER_SITE_OTHER): Payer: Medicare Other | Admitting: Cardiology

## 2010-10-29 VITALS — BP 93/70 | HR 82 | Ht 72.0 in | Wt 248.0 lb

## 2010-10-29 DIAGNOSIS — I2510 Atherosclerotic heart disease of native coronary artery without angina pectoris: Secondary | ICD-10-CM

## 2010-10-29 DIAGNOSIS — I509 Heart failure, unspecified: Secondary | ICD-10-CM

## 2010-10-29 DIAGNOSIS — I10 Essential (primary) hypertension: Secondary | ICD-10-CM

## 2010-10-29 DIAGNOSIS — I428 Other cardiomyopathies: Secondary | ICD-10-CM

## 2010-10-29 DIAGNOSIS — I4891 Unspecified atrial fibrillation: Secondary | ICD-10-CM

## 2010-10-29 DIAGNOSIS — Z7901 Long term (current) use of anticoagulants: Secondary | ICD-10-CM

## 2010-10-29 DIAGNOSIS — N289 Disorder of kidney and ureter, unspecified: Secondary | ICD-10-CM

## 2010-10-29 DIAGNOSIS — M4800 Spinal stenosis, site unspecified: Secondary | ICD-10-CM

## 2010-10-29 NOTE — Patient Instructions (Signed)
Your physician recommends that you schedule a follow-up appointment in: 3 months. Your physician recommends that you continue on your current medications as directed. Please refer to the Current Medication list given to you today. 

## 2010-10-29 NOTE — Assessment & Plan Note (Signed)
On multiple occasions we have been involved with stopping his Coumadin when appropriate for his injections.

## 2010-10-29 NOTE — Assessment & Plan Note (Signed)
His overall cardiac status is stable.  He has had some problems with some mild fluid overload.  When he had significant swelling he has some leaking from his leg.  I explained that this was a mechanical issue.  His edema needs to be controlled.  He needs to watch his total fluid intake.  He and his wife will continue to adjust his diuretic dose as needed.

## 2010-10-29 NOTE — Assessment & Plan Note (Signed)
No change in therapy at this time.

## 2010-10-29 NOTE — Assessment & Plan Note (Signed)
Patient's renal status is relatively stable.  There is no absolute contraindication to tramadol.

## 2010-10-29 NOTE — Progress Notes (Signed)
HPI Patient is seen for cardiology followup.  As outlined in the past he has multiple cardiac problems.  He's actually been relatively stable from the back viewpoint.  He has continued back pain.  He had a myelogram showing that his result was good.  He is now seeing a physician that does pain management as part of Dr. Donia Pounds group.  He had one injection of a different kind with 4 days of relief and then some more pain.  He is to have a second injection and other issues will be considered.  I believe it will be okay for him to have a spine stimulator with his ICD in place but we will have to check with the electrophysiology team.  His blood pressures been under reasonable control.  He said some edema recently and has adjusted his diuretic dose.  I had a long discussion with him and his wife about limiting his fluid intake including water soft drinks and beer. Allergies  Allergen Reactions  . Meperidine Hcl   . Penicillins     REACTION: anaphylaxis    Current Outpatient Prescriptions  Medication Sig Dispense Refill  . ACCU-CHEK COMFORT CURVE test strip USE AS DIRECTED  100 each  4  . ACCU-CHEK SOFTCLIX LANCETS lancets USE AS DIRECTED  100 each  4  . amLODipine (NORVASC) 2.5 MG tablet Take 2.5 mg by mouth daily.        Marland Kitchen aspirin 81 MG tablet Take 81 mg by mouth once a week.        . carvedilol (COREG) 25 MG tablet Take 25 mg by mouth 2 (two) times daily with a meal.        . furosemide (LASIX) 40 MG tablet Take 1 tablet (40 mg total) by mouth 2 (two) times daily. May take extra tablet if needed.  180 tablet  3  . gabapentin (NEURONTIN) 100 MG capsule Take 100 mg by mouth 2 (two) times daily.       Marland Kitchen glimepiride (AMARYL) 2 MG tablet Take 1 tablet (2 mg total) by mouth daily before breakfast.  90 tablet  1  . losartan (COZAAR) 50 MG tablet Take 50 mg by mouth 2 (two) times daily.        . metFORMIN (GLUCOPHAGE) 500 MG tablet Take 500 mg by mouth daily.        . methocarbamol (ROBAXIN) 500 MG  tablet Take 500 mg by mouth 2 (two) times daily.       . nitroGLYCERIN (NITROSTAT) 0.4 MG SL tablet Place 0.4 mg under the tongue every 5 (five) minutes as needed.        . potassium chloride (KLOR-CON) 20 MEQ packet Take 10 mEq by mouth daily.       . simvastatin (ZOCOR) 40 MG tablet Take 1 tablet (40 mg total) by mouth at bedtime.  90 tablet  1  . traMADol (ULTRAM) 50 MG tablet as needed.      . warfarin (COUMADIN) 2.5 MG tablet Take by mouth as directed.          History   Social History  . Marital Status: Married    Spouse Name: N/A    Number of Children: N/A  . Years of Education: N/A   Occupational History  . Not on file.   Social History Main Topics  . Smoking status: Former Smoker    Quit date: 03/21/1990  . Smokeless tobacco: Former Neurosurgeon  . Alcohol Use: 0.0 oz/week     beer  .  Drug Use: No  . Sexually Active: Not on file   Other Topics Concern  . Not on file   Social History Narrative  . No narrative on file    Family History  Problem Relation Age of Onset  . Hypertension Mother   . Stroke Mother   . Diabetes Father   . Coronary artery disease Father     Past Medical History  Diagnosis Date  . Diabetes mellitus   . Hyperlipidemia   . Hypertension   . Pilonidal cyst   . Atrial fibrillation     Previous long-term amiodarone therapy with multiple cardioversions / amiodarone stopped September, 2009  . Atrial flutter     Started November, 2010, Left-sided and cannot ablate  . Left atrial thrombus     Remote past... cardioversions done since that time  . COPD (chronic obstructive pulmonary disease)     PFTs.. relatively good function 2010  . Wide-complex tachycardia   . Left ventricular ejection fraction less than 40%   . Gout   . AAA (abdominal aortic aneurysm)     Surgical repair  . Discolored skin   . S/P ICD (internal cardiac defibrillator) procedure     Dr. Ladona Ridgel 2009... by the pacing  . SOB (shortness of breath)     Large left effusion/  thoracentesis/hospitalization/November, 2011... exudated.. cytology negative.. Dr.Wert.. no proof of mesothelioma  . Pericardial effusion   . Pleural effusion   . Renal insufficiency   . S/P AAA repair   . Spinal stenosis     Surgery Dr.Elsner  . CAD (coronary artery disease)     Catheterization July, 2008... name and vein grafts patent but low cardiac output  . Warfarin anticoagulation   . Cardiomyopathy     Ischemic... ICD  . CHF (congestive heart failure)     EF 30-40%... echo.. November, 2008  /  EF 55-60% echo... November, 2011  . Venous insufficiency     Toe discoloration chronic  . Mitral regurgitation     Mild echo  . Aortic valve sclerosis   . Nasal drainage     Chronic  . Alcohol ingestion of more than four drinks per week     Excess beer  not a dependency problem  . Chronotropic incompetence     IV pacing rate adjusted  . Thrombophlebitis of superficial veins of upper extremities     Possible venous stenosis from defibrillator  . Pericardial effusion     November, 2011 .. decreased during hospitalization  . Eye abnormality     Ophthalmologist questions a clot in one of his eyes, May, 2012    Past Surgical History  Procedure Date  . Colonoscopy w/ polypectomy   . Abdominal aortic aneurysm repair   . Coronary artery bypass graft   . Icd      insertion  . Lumbar fusion   . Pilonidal cyst removal   . Surgery scrotal / testicular     ROS  Patient denies fever, chills, headache, sweats, rash, change in vision, change in hearing, chest pain, cough, nausea vomiting, urinary symptoms.  All other systems are reviewed and are negative other than the history of present illness.  PHYSICAL EXAM Patient is stable.  He is overweight.  He is oriented to person time and place.  Affect is normal.  He is here with his wife today.  Head is atraumatic.  Lungs are clear.  Respiratory effort is not labored.  His abdomen is protuberant.  Cardiac exam reveals S1 and  S2.  There are  no clicks or significant murmurs.  His abdomen is soft.  He has mild peripheral edema.  There no musculoskeletal deformities.  No skin rashes.  He does have ecchymoses from his medications. Filed Vitals:   10/29/10 1359  BP: 93/70  Pulse: 82  Height: 6' (1.829 m)  Weight: 248 lb (112.492 kg)    EKG  Is done today and reviewed by me.  It is paced.  There is no significant change.  ASSESSMENT & PLAN

## 2010-10-29 NOTE — Assessment & Plan Note (Signed)
He continues to have an unusual pattern with his blood pressure.  His current meds however are optimal for him.  No change.

## 2010-10-29 NOTE — Assessment & Plan Note (Signed)
Time shows that he had a good result but he has continued pain.  He continues to work with his other physicians.

## 2010-11-02 ENCOUNTER — Other Ambulatory Visit: Payer: Self-pay | Admitting: General Surgery

## 2010-11-02 ENCOUNTER — Encounter (INDEPENDENT_AMBULATORY_CARE_PROVIDER_SITE_OTHER): Payer: Medicare Other | Admitting: *Deleted

## 2010-11-02 ENCOUNTER — Telehealth: Payer: Self-pay

## 2010-11-02 DIAGNOSIS — M79609 Pain in unspecified limb: Secondary | ICD-10-CM

## 2010-11-02 DIAGNOSIS — M7989 Other specified soft tissue disorders: Secondary | ICD-10-CM

## 2010-11-02 NOTE — Telephone Encounter (Signed)
French Ana from the [facility] called indicating patient was in office about his hand and she looked at his leg and it was swollen and red. Patient is currently holding coumadin for a pending procedure.   I informed French Ana, patient needs to be seen and have U/S of leg. Dr.Hopper's schedule is full as well as the other Dr.'s at this location. Patient to be seen at [facility] or his cardiologist (whom coumadin is monitored by). I was placed on hold while French Ana spoke with patient and he prefers to see Cardiologist.   French Ana will contact patient's Cardiologist and call our office back if needed

## 2010-11-03 ENCOUNTER — Ambulatory Visit: Payer: Medicare Other | Admitting: Internal Medicine

## 2010-11-03 ENCOUNTER — Encounter: Payer: Self-pay | Admitting: General Surgery

## 2010-11-03 ENCOUNTER — Other Ambulatory Visit: Payer: Self-pay | Admitting: Cardiology

## 2010-11-04 NOTE — Telephone Encounter (Signed)
Coumadin refill request 

## 2010-11-08 ENCOUNTER — Ambulatory Visit (INDEPENDENT_AMBULATORY_CARE_PROVIDER_SITE_OTHER): Payer: Medicare Other | Admitting: Internal Medicine

## 2010-11-08 ENCOUNTER — Encounter: Payer: Self-pay | Admitting: Internal Medicine

## 2010-11-08 ENCOUNTER — Inpatient Hospital Stay (HOSPITAL_COMMUNITY)
Admission: EM | Admit: 2010-11-08 | Discharge: 2010-11-12 | DRG: 603 | Disposition: A | Discharge status: Home / Self Care | Payer: Medicare Other | Source: Ambulatory Visit | Attending: Internal Medicine | Admitting: Internal Medicine

## 2010-11-08 VITALS — BP 128/80 | Temp 98.4°F | Wt 254.0 lb

## 2010-11-08 DIAGNOSIS — Z7901 Long term (current) use of anticoagulants: Secondary | ICD-10-CM

## 2010-11-08 DIAGNOSIS — M545 Low back pain, unspecified: Secondary | ICD-10-CM | POA: Diagnosis present

## 2010-11-08 DIAGNOSIS — Z95810 Presence of automatic (implantable) cardiac defibrillator: Secondary | ICD-10-CM

## 2010-11-08 DIAGNOSIS — I2589 Other forms of chronic ischemic heart disease: Secondary | ICD-10-CM | POA: Diagnosis present

## 2010-11-08 DIAGNOSIS — Z833 Family history of diabetes mellitus: Secondary | ICD-10-CM

## 2010-11-08 DIAGNOSIS — Z79899 Other long term (current) drug therapy: Secondary | ICD-10-CM

## 2010-11-08 DIAGNOSIS — Z7982 Long term (current) use of aspirin: Secondary | ICD-10-CM

## 2010-11-08 DIAGNOSIS — L02419 Cutaneous abscess of limb, unspecified: Principal | ICD-10-CM | POA: Diagnosis present

## 2010-11-08 DIAGNOSIS — I4892 Unspecified atrial flutter: Secondary | ICD-10-CM | POA: Diagnosis present

## 2010-11-08 DIAGNOSIS — I10 Essential (primary) hypertension: Secondary | ICD-10-CM | POA: Diagnosis present

## 2010-11-08 DIAGNOSIS — Z86718 Personal history of other venous thrombosis and embolism: Secondary | ICD-10-CM

## 2010-11-08 DIAGNOSIS — Z8249 Family history of ischemic heart disease and other diseases of the circulatory system: Secondary | ICD-10-CM

## 2010-11-08 DIAGNOSIS — Z880 Allergy status to penicillin: Secondary | ICD-10-CM

## 2010-11-08 DIAGNOSIS — Z951 Presence of aortocoronary bypass graft: Secondary | ICD-10-CM

## 2010-11-08 DIAGNOSIS — R609 Edema, unspecified: Secondary | ICD-10-CM

## 2010-11-08 DIAGNOSIS — I2510 Atherosclerotic heart disease of native coronary artery without angina pectoris: Secondary | ICD-10-CM | POA: Diagnosis present

## 2010-11-08 DIAGNOSIS — I447 Left bundle-branch block, unspecified: Secondary | ICD-10-CM | POA: Diagnosis present

## 2010-11-08 DIAGNOSIS — I4891 Unspecified atrial fibrillation: Secondary | ICD-10-CM | POA: Diagnosis present

## 2010-11-08 DIAGNOSIS — Z823 Family history of stroke: Secondary | ICD-10-CM

## 2010-11-08 DIAGNOSIS — E785 Hyperlipidemia, unspecified: Secondary | ICD-10-CM | POA: Diagnosis present

## 2010-11-08 DIAGNOSIS — L03119 Cellulitis of unspecified part of limb: Secondary | ICD-10-CM

## 2010-11-08 DIAGNOSIS — G8929 Other chronic pain: Secondary | ICD-10-CM | POA: Diagnosis present

## 2010-11-08 DIAGNOSIS — Z87891 Personal history of nicotine dependence: Secondary | ICD-10-CM

## 2010-11-08 DIAGNOSIS — E119 Type 2 diabetes mellitus without complications: Secondary | ICD-10-CM | POA: Diagnosis present

## 2010-11-08 LAB — DIFFERENTIAL
Eosinophils Absolute: 0.1 10*3/uL (ref 0.0–0.7)
Eosinophils Relative: 1 % (ref 0–5)
Lymphs Abs: 1.3 10*3/uL (ref 0.7–4.0)
Monocytes Absolute: 0.6 10*3/uL (ref 0.1–1.0)
Monocytes Relative: 9 % (ref 3–12)
Neutrophils Relative %: 71 % (ref 43–77)

## 2010-11-08 LAB — CBC
MCH: 32.2 pg (ref 26.0–34.0)
MCHC: 33.9 g/dL (ref 30.0–36.0)
MCV: 95.1 fL (ref 78.0–100.0)
Platelets: 165 10*3/uL (ref 150–400)
RBC: 4.69 MIL/uL (ref 4.22–5.81)

## 2010-11-08 LAB — BASIC METABOLIC PANEL
CO2: 28 mEq/L (ref 19–32)
Calcium: 9.7 mg/dL (ref 8.4–10.5)
Creatinine, Ser: 1.25 mg/dL (ref 0.50–1.35)
GFR calc non Af Amer: 57 mL/min — ABNORMAL LOW (ref >60)

## 2010-11-08 LAB — PROTIME-INR: Prothrombin Time: 16.5 seconds — ABNORMAL HIGH (ref 11.6–15.2)

## 2010-11-08 NOTE — Progress Notes (Signed)
  Subjective:    Patient ID: [name], male    DOB: November 07, 1940, 70 y.o.   MRN: [id number]  HPI [name] has been treated for cellulitis for the last week with doxycycline. He has a past history of anaphylaxis with penicillin  which prevents use of an agent such as Augmentin. Subjectively the redness may be stable;it  has certainly not improved significantly. The edema associated with the redness has actually increased over the past week. Cultures have been negative for bacteria at Dr. Loyce Dys  Office. No PMH of MRSA On 8/14, because of the edema a venous Doppler was done of the right lower extremity. This revealed no clots. He had been off Coumadin for a  procedure; this was restarted 8/15.   Review of Systems he denies fever, chills, or sweats. He's had no chest pain, shortness of breath or hemoptysis. Fasting blood sugars have averaged 1 4145     Objective:   Physical Exam he is in no acute distress; he has no increased work of breathing  He has no lymphadenopathy about the head, neck, or axilla.  Chest is clear without rales, rhonchi or wheezes. Breath sounds are somewhat decreased  Heart sounds are distant; an S4 is suggested.  He has gross edema of the entire right lower extremity below the knee. There is marked erythema of the posterior calf.Homan's sign is negative Pedal pulses are not palpable on the right due to the edema. Ecchymosis is suggested over the third right toe due to the edema.         Assessment & Plan:   #1 cellulitis right lower extremity ; essentially no response to one week of doxycycline. Past medical history of anaphylaxis with penicillin. Now back on  on warfarin; fluoroquinolones such as Levaquin  relatively contraindicated. Additionally with the lack of improvement to date; parenteral therapy appears necessary to prevent complications such as sepsis. #2 edema right lower extremity, progressive,with negative venous Doppler 8/14

## 2010-11-08 NOTE — Patient Instructions (Signed)
Share records with  with  ER Triage Nurse

## 2010-11-09 ENCOUNTER — Emergency Department (HOSPITAL_COMMUNITY): Payer: Medicare Other

## 2010-11-09 DIAGNOSIS — Z01810 Encounter for preprocedural cardiovascular examination: Secondary | ICD-10-CM

## 2010-11-09 LAB — CBC
HCT: 41.8 % (ref 39.0–52.0)
Hemoglobin: 14.6 g/dL (ref 13.0–17.0)
MCH: 32.7 pg (ref 26.0–34.0)
MCV: 93.7 fL (ref 78.0–100.0)
Platelets: 154 10*3/uL (ref 150–400)
RBC: 4.46 MIL/uL (ref 4.22–5.81)
WBC: 7.2 10*3/uL (ref 4.0–10.5)

## 2010-11-09 LAB — COMPREHENSIVE METABOLIC PANEL
ALT: 10 U/L (ref 0–53)
AST: 10 U/L (ref 0–37)
CO2: 23 mEq/L (ref 19–32)
Chloride: 106 mEq/L (ref 96–112)
Creatinine, Ser: 1.06 mg/dL (ref 0.50–1.35)
GFR calc Af Amer: >60 mL/min (ref >60)
GFR calc non Af Amer: >60 mL/min (ref >60)
Glucose, Bld: 118 mg/dL — ABNORMAL HIGH (ref 70–99)
Total Bilirubin: 0.4 mg/dL (ref 0.3–1.2)

## 2010-11-09 LAB — GLUCOSE, CAPILLARY: Glucose-Capillary: 108 mg/dL — ABNORMAL HIGH (ref 70–99)

## 2010-11-09 LAB — DIFFERENTIAL
Eosinophils Absolute: 0.1 10*3/uL (ref 0.0–0.7)
Lymphocytes Relative: 15 % (ref 12–46)
Lymphs Abs: 1.1 10*3/uL (ref 0.7–4.0)
Monocytes Relative: 9 % (ref 3–12)
Neutro Abs: 5.4 10*3/uL (ref 1.7–7.7)
Neutrophils Relative %: 75 % (ref 43–77)

## 2010-11-09 LAB — MAGNESIUM: Magnesium: 2.2 mg/dL (ref 1.5–2.5)

## 2010-11-09 MED ORDER — IOHEXOL 300 MG/ML  SOLN
100.0000 mL | Freq: Once | INTRAMUSCULAR | Status: AC | PRN
  Administered 2010-11-09: 100 mL via INTRAVENOUS

## 2010-11-10 LAB — GLUCOSE, CAPILLARY
Glucose-Capillary: 164 mg/dL — ABNORMAL HIGH (ref 70–99)
Glucose-Capillary: 164 mg/dL — ABNORMAL HIGH (ref 70–99)
Glucose-Capillary: 123 mg/dL — ABNORMAL HIGH (ref 70–99)

## 2010-11-10 LAB — COMPREHENSIVE METABOLIC PANEL
ALT: 8 U/L (ref 0–53)
AST: 10 U/L (ref 0–37)
Albumin: 3 g/dL — ABNORMAL LOW (ref 3.5–5.2)
Alkaline Phosphatase: 71 U/L (ref 39–117)
BUN: 16 mg/dL (ref 6–23)
Potassium: 3.4 mEq/L — ABNORMAL LOW (ref 3.5–5.1)
Sodium: 142 mEq/L (ref 135–145)
Total Protein: 5.9 g/dL — ABNORMAL LOW (ref 6.0–8.3)

## 2010-11-10 LAB — CBC
MCHC: 34.4 g/dL (ref 30.0–36.0)
Platelets: 161 10*3/uL (ref 150–400)
RDW: 13.8 % (ref 11.5–15.5)

## 2010-11-10 LAB — DIFFERENTIAL
Basophils Absolute: 0 10*3/uL (ref 0.0–0.1)
Basophils Relative: 0 % (ref 0–1)
Eosinophils Absolute: 0.1 10*3/uL (ref 0.0–0.7)
Eosinophils Relative: 1 % (ref 0–5)
Monocytes Absolute: 0.7 10*3/uL (ref 0.1–1.0)

## 2010-11-10 LAB — MAGNESIUM: Magnesium: 2.3 mg/dL (ref 1.5–2.5)

## 2010-11-11 LAB — COMPREHENSIVE METABOLIC PANEL
BUN: 18 mg/dL (ref 6–23)
Calcium: 9.2 mg/dL (ref 8.4–10.5)
Creatinine, Ser: 1.01 mg/dL (ref 0.50–1.35)
GFR calc Af Amer: >60 mL/min (ref >60)
Glucose, Bld: 115 mg/dL — ABNORMAL HIGH (ref 70–99)
Total Protein: 5.9 g/dL — ABNORMAL LOW (ref 6.0–8.3)

## 2010-11-11 LAB — CBC
MCH: 32.7 pg (ref 26.0–34.0)
MCV: 93.4 fL (ref 78.0–100.0)
Platelets: 150 10*3/uL (ref 150–400)
RDW: 13.8 % (ref 11.5–15.5)
WBC: 5.8 10*3/uL (ref 4.0–10.5)

## 2010-11-11 LAB — GLUCOSE, CAPILLARY
Glucose-Capillary: 138 mg/dL — ABNORMAL HIGH (ref 70–99)
Glucose-Capillary: 148 mg/dL — ABNORMAL HIGH (ref 70–99)

## 2010-11-11 LAB — DIFFERENTIAL
Eosinophils Absolute: 0.1 10*3/uL (ref 0.0–0.7)
Eosinophils Relative: 1 % (ref 0–5)
Lymphs Abs: 1 10*3/uL (ref 0.7–4.0)

## 2010-11-11 LAB — MAGNESIUM: Magnesium: 2.2 mg/dL (ref 1.5–2.5)

## 2010-11-12 LAB — VANCOMYCIN, TROUGH: Vancomycin Tr: 25 ug/mL — ABNORMAL HIGH (ref 10.0–20.0)

## 2010-11-12 LAB — GLUCOSE, CAPILLARY
Glucose-Capillary: 109 mg/dL — ABNORMAL HIGH (ref 70–99)
Glucose-Capillary: 130 mg/dL — ABNORMAL HIGH (ref 70–99)
Glucose-Capillary: 127 mg/dL — ABNORMAL HIGH (ref 70–99)

## 2010-11-12 LAB — COMPREHENSIVE METABOLIC PANEL
AST: 10 U/L (ref 0–37)
Albumin: 2.9 g/dL — ABNORMAL LOW (ref 3.5–5.2)
Calcium: 9.2 mg/dL (ref 8.4–10.5)
Creatinine, Ser: 1.09 mg/dL (ref 0.50–1.35)
Sodium: 140 mEq/L (ref 135–145)

## 2010-11-12 LAB — APTT: aPTT: 32 seconds (ref 24–37)

## 2010-11-12 LAB — CBC
HCT: 42.7 % (ref 39.0–52.0)
Platelets: 147 10*3/uL — ABNORMAL LOW (ref 150–400)
RBC: 4.59 MIL/uL (ref 4.22–5.81)
RDW: 13.5 % (ref 11.5–15.5)
WBC: 7.1 10*3/uL (ref 4.0–10.5)

## 2010-11-12 LAB — DIFFERENTIAL
Basophils Absolute: 0 10*3/uL (ref 0.0–0.1)
Eosinophils Absolute: 0.1 10*3/uL (ref 0.0–0.7)
Eosinophils Relative: 2 % (ref 0–5)
Lymphocytes Relative: 15 % (ref 12–46)
Neutrophils Relative %: 74 % (ref 43–77)

## 2010-11-12 LAB — PROTIME-INR: INR: 1.6 — ABNORMAL HIGH (ref 0.00–1.49)

## 2010-11-12 NOTE — Discharge Summary (Signed)
NAME:  [name], [name] NO.:  [id number]  MEDICAL RECORD NO.:  [id number]  LOCATION:  5036                         FACILITY:  [facility]  PHYSICIAN:  Talmage Nap, MD  DATE OF BIRTH:  [birth year]  DATE OF ADMISSION:  11/08/2010 DATE OF DISCHARGE:  11/12/2010                        DISCHARGE SUMMARY - REFERRING   PRIMARY CARE PHYSICIAN:  Titus Dubin. Alwyn Ren, MD,FACP,FCCP  PRIMARY CARDIOLOGIST:  [name], MD, [facility].  PRIMARY EP CARDIOLOGIST:  Doylene Canning. Ladona Ridgel, MD  PRIMARY DISCHARGE DIAGNOSES: 1. Cellulitis right lower extremity/edema.  SECONDARY DIAGNOSES: 1. Coronary artery disease status post CABG. 2. Ischemic cardiomyopathy status post defibrillator. 3. Diabetes mellitus. 4. Atrial fibrillation/atrial flutter, on anticoagulation with     Coumadin. 5. Chronic low back pain with periodic steroid injection into the     lower back. 6. Hyperlipidemia. 7. Questionable obstructive airway disease. HISTORY:  The patient is a 70 year old very pleasant Caucasian male with history of coronary artery disease status post CABG and defibrillator, was admitted to the hospital on November 08, 2010, by Dr. Midge Minium with a 1-week history of right lower extremity swelling and this was said to be getting progressively worse.  The patient was said to have denied any history of trauma.  There was no history of fever. No chills.  No rigor.  The symptoms were said to be getting progressively worse, hence the patient presented to the hospital to be evaluated and for IV antibiotics.  MEDICATIONS:  Preadmission meds include, 1. Amlodipine 2.5 mg p.o. daily. 2. Simvastatin 40 mg p.o. daily. 3. Nitroglycerin p.r.n. 4. Methocarbamol 500 mg p.o. b.i.d. 5. Metformin 500 mg p.o. daily. 6. Ipratropium bromide spray. 7. Indomethacin 25 mg p.o. twice a daily. 8. Glimepiride 2 mg half a tablet p.o. daily. 9. Gabapentin 100 mg p.o. b.i.d. 10.Furosemide 40 mg one p.o.  b.i.d. 11.Cozaar 50 mg p.o. b.i.d. 12.Coreg 25 mg p.o. b.i.d. 13.Aspirin 81 mg p.o. daily.  ALLERGIES:  TO PENICILLIN.  PAST SURGICAL HISTORY: 1. Coronary artery disease status post CABG. 2. Ischemic cardiomyopathy status post defibrillator. 3. Right hand surgery, indication is unknown. 4. Lower back surgery status post periodic injection of the steroids. 5. Right pleural effusion with plaque status post thoracentesis.  SOCIAL HISTORY:  The patient quit smoking about 7 years ago. Occasionally takes beer.  Denies any history of street drug use. Married and lives at home with his wife.  FAMILY HISTORY:  York Spaniel to be positive for CVA, diabetes mellitus, as well as hypertension.  REVIEW OF SYSTEMS:  Essentially documented in the initial history and physical.  PHYSICAL EXAMINATION:  GENERAL:  At that time, the patient was seen by the admitting physician.  He was said not to be in any distress. VITAL SIGNS:  Blood pressure is 133/84 and repeat was 185/115, pulse was 80, respiratory 20, saturating 96% on room air. HEENT:  Pupils are reactive to light and extraocular muscles are intact. NECK:  No jugular venous distention.  No carotid bruit.  No lymphadenopathy. CHEST:  Clear to auscultation. HEART:  Sounds are 1 and 2. ABDOMEN:  Soft, nontender.  Liver, spleen, kidney are not palpable. Bowel sounds are positive. EXTREMITIES:  Showed swelling of the right lower extremity  with surrounding erythema and warmth to touch.  LABORATORY DATA:  Initial complete blood count with differential showed WBC of 6.7, hemoglobin of 15.1, hematocrit of 44.6, MCV of 95.1 with a platelet count of 165, normal differential.  Basic metabolic panel shows sodium of 141, potassium of 5.1, chloride of 104 with a bicarb of 28, glucose is 232, BUN is 20, creatinine is 1.25.  Coagulation profile showed PT 16.5, INR 1.35, D-dimer 4.22, creatinine kinase is 35. Hemoglobin A1c is 6.7.  Blood culture x2, no growth.   A repeat complete blood count with differential done on November 12, 2010, showed WBC of 7.1, hemoglobin of 14.8, hematocrit of 42.7, MCV of 93.0 with a platelet count of 147.  Coagulation profile showed PT 19.3, INR 1.60, and APTT of 32.  Comprehensive metabolic panel showed sodium of 140, potassium of 3.4, chloride of 102 with a bicarb of 26, glucose is 116, BUN 15, creatinine is 1.09, and a magnesium level is 2.2.  Imaging studies done include CT of the right tibia fibular without contrast showed no evidence of abscess.  There is diffuse soft tissue edema involving the superficial soft tissue of the right leg extending from the knee to the foot.  There is diffuse arterial calcification noted within the right leg.  Duplex ultrasound of the lower extremity was negative for DVT or Baker cyst.  HOSPITAL COURSE:  The patient was admitted to general medical floor with an impression of cellulitis of the right lower extremity and was started on IV Primaxin as well as vancomycin and dosing was done by pharmacy. Blood pressure was maintained with hydralazine 10 mg IV q. 4 hourly. Other medication given to the patient include thiamine 100 mg p.o. daily and then anticoagulation with Coumadin was done daily by pharmacy because of the history of atrial fibrillation.  The patient was seen by me for the very first time in this index admission on November 09, 2010 and during this encounter, the patient was found to have minimal swelling of the right lower extremity with surrounding erythema.  At this time, the patient may see that was initially given p.o. was discontinued and the patient was placed on Lasix 40 mg IV q. 12 hourly followed by KCl 20 mEq p.o. b.i.d.  He was subsequently evaluated by me on a daily basis.  On November 11, 2010, since the patient's blood remained fairly stable, IV hydralazine was discontinued and amlodipine 5 mg p.o. daily was added to the patient's regimen.  Anticoagulation  with Coumadin was also done by pharmacy.  The patient was followed and evaluated by me on a daily basis and erythema as well as swelling of the right lower extremity showed gradual resolution.  So far, the patient had remained clinically stable.  He was seen by me today.  Examination showed remarkable gradual resolution of the edema and the swelling of the right lower extremity and vital signs are stable.  Blood pressure is 122/75, temperature is 98.2, pulse 72, respiratory rate 18, medically stable.  Plan is for the patient to be discharged home today on activity as tolerated, daily weight, fluid restriction.  He was advised to watch out for signs and symptoms of congestive heart failure, which include shortness of breath, swelling of the lower extremity, and congestion in the lungs.  Should he develop any of these symptoms, should return to the hospital to be evaluated.  Medication to be taken at home include the following, 1. Clindamycin 450mg  one p.o. t.i.d. for the  next 7 days. 2. Potassium chloride 20 mEq one p.o. b.i.d. 3. Crestor 10 mg one p.o. daily at bedtime. 4. Thiamine 100 mg one p.o. daily. 5. Amlodipine 2.5 mg p.o. daily at bedtime. 6. Aspirin 81 mg p.o. on Sunday. 7. Carvedilol 25 mg one p.o. b.i.d. 8. Cozaar (losartan) 50 mg one p.o. b.i.d. 9. Furosemide 40 mg one p.o. b.i.d. 10.Gabapentin 100 mg one p.o. b.i.d. 11.Glimepiride (Amaryl) 2 mg half a tablet p.o. q.a.m. 12.Indomethacin 25 mg one p.o. t.i.d. p.r.n. 13.Ipratropium bromide spray 0.06% one spray nasal daily. 14.Potassium chloride 20 mEq half a tablet p.o. daily. 15.Metformin 500 mg one p.o. daily at bedtime. 16.Methocarbamol 500 mg one p.o. b.i.d. 17.Nitroglycerin sublingual 0.4 mg q. 5 minutes x3 doses p.r.n. 18.Warfarin 2.5 mg 2 tablets on Wednesdays and Sundays and 1 tablet     other days.     Talmage Nap, MD     CN/MEDQ  D:  11/12/2010  T:  11/12/2010  Job:  [id number]  cc:   Titus Dubin.  Alwyn Ren, MD,FACP,FCCP  Electronically Signed by Talmage Nap  on 11/12/2010 05:56:05 PM

## 2010-11-15 LAB — CULTURE, BLOOD (ROUTINE X 2)
Culture  Setup Time: 201208210956
Culture: NO GROWTH
Culture: NO GROWTH

## 2010-11-16 ENCOUNTER — Ambulatory Visit (INDEPENDENT_AMBULATORY_CARE_PROVIDER_SITE_OTHER): Payer: Medicare Other | Admitting: *Deleted

## 2010-11-16 DIAGNOSIS — I4891 Unspecified atrial fibrillation: Secondary | ICD-10-CM

## 2010-11-16 DIAGNOSIS — Z7901 Long term (current) use of anticoagulants: Secondary | ICD-10-CM

## 2010-11-16 LAB — POCT INR: INR: 3.1

## 2010-11-19 ENCOUNTER — Ambulatory Visit (INDEPENDENT_AMBULATORY_CARE_PROVIDER_SITE_OTHER): Payer: Medicare Other | Admitting: Internal Medicine

## 2010-11-19 ENCOUNTER — Telehealth: Payer: Self-pay | Admitting: Internal Medicine

## 2010-11-19 VITALS — BP 110/80 | HR 78 | Temp 98.0°F | Wt 251.0 lb

## 2010-11-19 DIAGNOSIS — L03119 Cellulitis of unspecified part of limb: Secondary | ICD-10-CM

## 2010-11-19 DIAGNOSIS — E119 Type 2 diabetes mellitus without complications: Secondary | ICD-10-CM

## 2010-11-19 DIAGNOSIS — L0390 Cellulitis, unspecified: Secondary | ICD-10-CM

## 2010-11-19 DIAGNOSIS — L02419 Cutaneous abscess of limb, unspecified: Secondary | ICD-10-CM

## 2010-11-19 DIAGNOSIS — L0291 Cutaneous abscess, unspecified: Secondary | ICD-10-CM

## 2010-11-19 DIAGNOSIS — E876 Hypokalemia: Secondary | ICD-10-CM

## 2010-11-19 MED ORDER — CLINDAMYCIN HCL 300 MG PO CAPS: 300.0000 mg | ORAL_CAPSULE | Freq: Three times a day (TID) | ORAL | Status: AC

## 2010-11-19 NOTE — Telephone Encounter (Signed)
Check the labs mid to late October as previously scheduled(A1c, urine microalbumin, fasting lipids; 250.00)

## 2010-11-19 NOTE — Telephone Encounter (Signed)
Left detailed msg with instructions orders placed pt just needs appt.

## 2010-11-19 NOTE — Patient Instructions (Addendum)
Use  Aveeno Daily Moisturizing Lotion once to   twice a day  for the drying. If there is itching; the moisturizing agent can be mixed one part to one part of Cort Aid  and applied twice a day as needed. Support hose when up &  Ambulating. Elevate leg @ night.  He has received a jury summons. Medically this is contraindicated because of his peripheral edema as well as his deafness. Permanent exclusion is medically indicated.

## 2010-11-19 NOTE — Progress Notes (Signed)
  Subjective:    Patient ID: [name], male    DOB: 1940-11-09, 70 y.o.   MRN: [id number]  HPI Since discharge from the  hospital, he denies fever, chills, or sweats. The right calf has decreased by 3 inches in circumference. He has been on clindamycin 450 mg 3 times a day for 7 days. He has had no diarrhea on this medicine  Several medications were changed during hospitalization. Simvastatin 40 was changed to Crestor 10 mg daily. He is on amlodipine but only at a dose of 2.5 mg.  His potassium was changed from 10 mEq once daily to 20 mEq twice a day. His wife states that he was on increased amounts of furosemide during hospitalization.  Thiamine 100 mg daily was also prescribed.  Review of Systems     Objective:   Physical Exam   He is in no acute distress at this time.   Chest is clear with no increased work of breathing  Has a slow S4 no significant murmurs There is erythema over the right lower extremity but no increased warmth. 1/2+ edema is noted. Hyperemia is suggested rather than cellulitis  as pressure on the right lower extremity causes blanching The edema is unilateral, right lower extremity only.       Assessment & Plan:   #1 cellulitis right lower extremity, dramatic improvement  #2 edema right lower extremity greater than the left suggesting chronic venous insufficiency. Were the edema related to amlodipine it should be equal in both legs.  #3 I see no reason to change from the simvastatin to Crestor as he is not on high dose calcium channel blocker  #4 potassium status will need to be monitored( he had been on 10 mEq daily but was D/Ced on 20 mEq twice a day)  Plan: Support hose  to minimize edema in the right lower extremity is recommended. Aveeno Daily Moisturizing moisturizing lotion should be employed to prevent drying

## 2010-11-23 ENCOUNTER — Encounter: Payer: Self-pay | Admitting: *Deleted

## 2010-11-23 NOTE — H&P (Signed)
NAME:  [name], [name] NO.:  [id number]  MEDICAL RECORD NO.:  [id number]  LOCATION:  [facility]                         FACILITY:  [facility]  PHYSICIAN:  Eduard Clos, MDDATE OF BIRTH:  Nov 21, 1940  DATE OF ADMISSION:  11/08/2010 DATE OF DISCHARGE:                             HISTORY & PHYSICAL   PRIMARY CARE PHYSICIAN:  Titus Dubin. Alwyn Ren, MD, FACP, [facility]  PRIMARY CARDIOLOGIST:  [name], MD, [facility]  PRIMARY EP CARDIOLOGIST:  Doylene Canning. Ladona Ridgel, MD  CHIEF COMPLAINT:  Right lower extremity swelling.  HISTORY OF PRESENTING ILLNESS:  A 70 year old male with history of CAD status post CABG, diabetes mellitus type 2, hypertension, history of atrial flutter/fibrillation, history of ischemic cardiomyopathy status post defibrillator placement, history of pleural plaques and pleural effusion status post thoracentesis 6 months ago, has been experiencing swelling of the right lower extremity for the last 1 week.  He had gone to his hand doctor for his right ring finger followup as he was requiring some surgery and the doctor found his right lower extremity was swollen.  He called Dr. Myrtis Ser for concern with DVT.  Dr. Myrtis Ser ordered a sonogram which was negative for DVT but since concerning with the swelling, called a dermatologist who prescribed him antibiotics and some local cream.  Despite taking these medicines, the swelling was persistent and worsening.  He went to the dermatologist again yesterday morning who referred him to the ER.  At this time, the patient has been admitted for his worsening cellulitis which at this time has failed p.o. antibiotics and will require IV antibiotics.  The patient states the whole lesion started as small pimple like lesion on the right lateral aspect of his calf muscles which progressively worsened in swelling.  Denies any fever or chills but he feels little bit sore, particularly in the posterior aspect of his calf muscle and the  swelling has extended up to his toes now.  He is able to flex and extend his foot.  There is no limitation of joint movements.  The skin is not tensed though swollen and erythematous, extending from the inferior aspect of his right knee up to his toes.  The patient denies any chest pain, shortness of breath, nausea, vomiting, abdominal pain, dysuria, discharge, or diarrhea.  He denies any focal deficit, headache, visual symptoms or any fever or chills.  The patient has been stopped Coumadin because he was expecting to get an epidural injection last Thursday which has been cancelled because of his developing cellulitis.  He has been restarted on Coumadin again.  The patient also had received small scratch from his pet dog 3 days ago, but this was at least 4 days after he developed a cellulitis and his pet dog the patient states that has been completely vaccinated.  PAST MEDICAL HISTORY: 1. History of CAD status post CABG. 2. History of cardiomyopathy status post defibrillator placement, last     EF measured was in November 2011, which was measured at 35%. 3. History of diabetes mellitus type 2. 4. History of atrial flutter/fibrillation on Coumadin.  Coumadin was     recently held in expectation for an epidural injection for his  chronic low back pain. 5. Chronic low back pain. 6. Chronic left bundle-branch block. 7. History of cigarette smoking, quit 7 years ago. 8. History of hyperlipidemia.  PAST SURGICAL HISTORY: 1. CABG. 2. Defibrillator placement. 3. Right hand surgery. 4. Low back fusion and surgery. 5. History of thoracentesis for pleural effusion with history of     pleural plaques.  MEDICATIONS PRIOR TO ADMISSION: 1. The patient is on Coumadin which was just restarted after his     epidural injection was cancelled. 2. Amlodipine 2.5 mg p.o. daily. 3. Simvastatin 40 mg p.o. daily. 4. Nitroglycerin p.r.n. 5. Methocarbamol 500 mg p.o. twice daily. 6. Metformin 500  mg daily. 7. Ipratropium bromide spray. 8. Indomethacin 25 mg p.o. 2 times daily. 9. Glimepiride 2 mg 1/2 tablet daily. 10.Gabapentin 800 mg twice daily. 11.Furosemide 40 mg 1 tablet twice daily. 12.Cozaar 50 mg twice daily. 13.Coreg 25 mg twice daily. 14.Aspirin 81 mg p.o. daily.  ALLERGIES:  PENICILLIN.  SOCIAL HISTORY:  The patient quit smoking 7 years ago.  Drinks alcohol, beer, usually 4-5 beers every day.  Denies any drug abuse.  Married, lives with his wife.  FAMILY HISTORY:  Positive for hypertension and CVA in his mother. Father is positive for diabetes mellitus type 2 and hypertension and one brother had diabetes.  REVIEW OF SYSTEMS:  As per the history of presenting illness, nothing else significant.  PHYSICAL EXAMINATION:  GENERAL:  The patient was examined at the bedside, not in acute distress. VITAL SIGNS:  Blood pressure 133/84, the last was measured at 185/115, we are going to recheck that, pulse was 80 per minute, temperature 98.2, respirations 20 per minute, O2 sat 96%. HEENT: Anicteric.  No pallor.  No discharge from ears, eyes, nose, or mouth. CHEST:  Bilateral air entry present.  No rhonchi, no crepitation. HEART:  S1 and S2 heard. ABDOMEN:  Soft, nontender.  Bowel sounds heard. CNS:  The patient is alert, awake, and oriented to time, place, and person.  Moves upper and lower, 5/5. EXTREMITIES:  There is swelling in the right lower extremity, extending from the toe to the inferior aspect of his right knee, nonpitting edema with some discoloration and erythema, it is nontender.  There is small scab in the right lateral aspect of his calf muscle.  The left extremity has no edema.  No acute ischemic changes, cyanosis, or clubbing.  LABS:  CBC; WBC 6.7, hemoglobin 15.1, hematocrit 44.6, platelets 155. PT/INR is 15.5 and 1.3.  Complete metabolic panel; sodium 141, potassium 5.1, chloride 104, carbon dioxide 28, glucose 232, BUN 30, creatinine 1.2, calcium  9.7.  ASSESSMENT: 1. Right lower extremity cellulitis. 2. History of coronary artery disease status post coronary artery     bypass graft. 3. History of ischemic cardiomyopathy, last EF was 35% last year in     November 2011. 4. History of atrial fibrillation/flutter, on Coumadin, presently rate     controlled. 5. Diabetes mellitus type 2. 6. Chronic low back pain. 7. Chronic left bundle-branch block. 8. Alcohol abuse. 9. History of tobacco abuse, quit more than 6 years ago.  PLAN: 1. At this time, we will admit the patient to the medical floor. 2. For his cellulitis of the right lower extremity, the patient did     have a Doppler done at Dr. Henrietta Hoover office, which was negative for     DVT that was on Thursday, almost 4 days ago.  I am going to repeat     another one to make  sure there is no new DVT developing.  For his     cellulitis, at this time we are going to continue with his     vancomycin which has been already started.  I will  also place him     on Primaxin.  At this time, the patient is able to flex and extend     his right foot.  The chance of him having any compartment syndrome     is less likely given his ability to move his legs and the skin does     not look very tensed.  It has good sensation.  We are going to     order a CK level.  I am going to also order CT of the right lower     extremity to make sure there is no abscess formation.  If the     patient is not up to date on his tetanus shot, we will give him a     tetanus booster dose. 3. Atrial fibrillation/flutter, presently rate controlled.  We will     continue his Coumadin as per pharmacy. 4. For his diabetes mellitus type 2, hypertension, CAD, and ischemic     cardiomyopathy, we will continue his present medications except for     metformin which will be held and the patient will be on sliding     scale insulin coverage with NovoLog. 5. Further recommendation based on test order and clinic  course.     Eduard Clos, MD     ANK/MEDQ  D:  11/09/2010  T:  11/09/2010  Job:  [id number]  Electronically Signed by Midge Minium MD on 11/23/2010 09:33:23 AM

## 2010-12-07 ENCOUNTER — Encounter: Payer: Self-pay | Admitting: Internal Medicine

## 2010-12-07 ENCOUNTER — Ambulatory Visit (INDEPENDENT_AMBULATORY_CARE_PROVIDER_SITE_OTHER): Payer: Medicare Other | Admitting: Internal Medicine

## 2010-12-07 ENCOUNTER — Encounter: Payer: Medicare Other | Admitting: *Deleted

## 2010-12-07 DIAGNOSIS — I472 Ventricular tachycardia: Secondary | ICD-10-CM

## 2010-12-07 DIAGNOSIS — I4891 Unspecified atrial fibrillation: Secondary | ICD-10-CM

## 2010-12-07 DIAGNOSIS — I509 Heart failure, unspecified: Secondary | ICD-10-CM

## 2010-12-07 DIAGNOSIS — Z95810 Presence of automatic (implantable) cardiac defibrillator: Secondary | ICD-10-CM

## 2010-12-07 DIAGNOSIS — I5023 Acute on chronic systolic (congestive) heart failure: Secondary | ICD-10-CM

## 2010-12-07 DIAGNOSIS — I4892 Unspecified atrial flutter: Secondary | ICD-10-CM

## 2010-12-07 LAB — ICD DEVICE OBSERVATION
AL IMPEDENCE ICD: 532 Ohm
BAMS-0001: 170 {beats}/min
LV LEAD IMPEDENCE ICD: 551 Ohm
RV LEAD AMPLITUDE: 13.6 mv
RV LEAD IMPEDENCE ICD: 532 Ohm
TZAT-0001ATACH: 1
TZAT-0002ATACH: NEGATIVE
TZAT-0002ATACH: NEGATIVE
TZAT-0004FASTVT: 8
TZAT-0011SLOWVT: 10 ms
TZAT-0011SLOWVT: 10 ms
TZAT-0012ATACH: 150 ms
TZAT-0012FASTVT: 200 ms
TZAT-0012SLOWVT: 200 ms
TZAT-0012SLOWVT: 200 ms
TZAT-0013FASTVT: 1
TZAT-0018ATACH: NEGATIVE
TZAT-0018SLOWVT: NEGATIVE
TZAT-0018SLOWVT: NEGATIVE
TZAT-0019ATACH: 6 V
TZAT-0019ATACH: 6 V
TZAT-0019ATACH: 6 V
TZAT-0019SLOWVT: 8 V
TZAT-0019SLOWVT: 8 V
TZAT-0020ATACH: 1.5 ms
TZAT-0020ATACH: 1.5 ms
TZAT-0020FASTVT: 1.5 ms
TZON-0003SLOWVT: 340 ms
TZON-0004SLOWVT: 24
TZON-0005SLOWVT: 12
TZST-0001ATACH: 4
TZST-0001ATACH: 5
TZST-0001FASTVT: 4
TZST-0001FASTVT: 5
TZST-0001SLOWVT: 4
TZST-0001SLOWVT: 6
TZST-0002ATACH: NEGATIVE
TZST-0002ATACH: NEGATIVE
TZST-0003FASTVT: 35 J
TZST-0003FASTVT: 35 J
TZST-0003SLOWVT: 15 J
TZST-0003SLOWVT: 35 J

## 2010-12-07 NOTE — Progress Notes (Signed)
HPI [name] returns today for followup. He is a 70 year old man with a history of an ischemic cardiomyopathy, chronic systolic heart failure, chronic atrial fibrillation, obesity, dietary indiscretion, status post ICD implantation. He has developed ventricular tachycardia. He denies ICD shocks. No syncope. He denies chest pain. He has chronic class IIB heart failure symptoms. Allergies  Allergen Reactions  . Penicillins     REACTION: anaphylaxis     Current Outpatient Prescriptions  Medication Sig Dispense Refill  . ACCU-CHEK COMFORT CURVE test strip USE AS DIRECTED  100 each  4  . ACCU-CHEK SOFTCLIX LANCETS lancets USE AS DIRECTED  100 each  4  . amLODipine (NORVASC) 2.5 MG tablet Take 2.5 mg by mouth daily.        Marland Kitchen aspirin 81 MG tablet Take 81 mg by mouth once a week.        . carvedilol (COREG) 25 MG tablet Take 25 mg by mouth 2 (two) times daily with a meal.        . Cholecalciferol (VITAMIN D PO) Take by mouth daily.        . furosemide (LASIX) 40 MG tablet Take 1 tablet (40 mg total) by mouth 2 (two) times daily. May take extra tablet if needed.  180 tablet  3  . gabapentin (NEURONTIN) 100 MG capsule Take 100 mg by mouth 2 (two) times daily.       Marland Kitchen glimepiride (AMARYL) 2 MG tablet Take by mouth daily before breakfast. 1/2 tab daily       . indomethacin (INDOCIN) 25 MG capsule Take 25 mg by mouth as needed.        Marland Kitchen losartan (COZAAR) 50 MG tablet Take 50 mg by mouth 2 (two) times daily.        . metFORMIN (GLUCOPHAGE) 500 MG tablet Take 500 mg by mouth daily.        . methocarbamol (ROBAXIN) 500 MG tablet Take 500 mg by mouth 2 (two) times daily.       . nitroGLYCERIN (NITROSTAT) 0.4 MG SL tablet Place 0.4 mg under the tongue every 5 (five) minutes as needed.        . potassium chloride (KLOR-CON) 20 MEQ packet Take 20 mEq by mouth daily. 1/2 tablet       . simvastatin (ZOCOR) 40 MG tablet Take 1 tablet (40 mg total) by mouth at bedtime.  90 tablet  1  . traMADol (ULTRAM) 50 MG  tablet as needed.      . warfarin (COUMADIN) 2.5 MG tablet USE AS DIRECTED BY ANTICOAGULATION CLINIC  130 tablet  1     Past Medical History  Diagnosis Date  . Diabetes mellitus   . Hyperlipidemia   . Hypertension   . Pilonidal cyst   . Atrial fibrillation     Previous long-term amiodarone therapy with multiple cardioversions / amiodarone stopped September, 2009  . Atrial flutter     Started November, 2010, Left-sided and cannot ablate  . Left atrial thrombus     Remote past... cardioversions done since that time  . COPD (chronic obstructive pulmonary disease)     PFTs.. relatively good function 2010  . Wide-complex tachycardia   . Left ventricular ejection fraction less than 40%   . Gout   . AAA (abdominal aortic aneurysm)     Surgical repair  . Discolored skin   . S/P ICD (internal cardiac defibrillator) procedure     Dr. Ladona Ridgel 2009... by the pacing  . SOB (shortness of breath)  Large left effusion/ thoracentesis/hospitalization/November, 2011... exudated.. cytology negative.. Dr.Wert.. no proof of mesothelioma  . Pericardial effusion   . Pleural effusion   . Renal insufficiency   . S/P AAA repair   . Spinal stenosis     Surgery Dr.Elsner  . CAD (coronary artery disease)     Catheterization July, 2008... name and vein grafts patent but low cardiac output  . Warfarin anticoagulation   . Cardiomyopathy     Ischemic... ICD  . CHF (congestive heart failure)     EF 30-40%... echo.. November, 2008  /  EF 55-60% echo... November, 2011  . Venous insufficiency     Toe discoloration chronic  . Mitral regurgitation     Mild echo  . Aortic valve sclerosis   . Nasal drainage     Chronic  . Alcohol ingestion of more than four drinks per week     Excess beer  not a dependency problem  . Chronotropic incompetence     IV pacing rate adjusted  . Thrombophlebitis of superficial veins of upper extremities     Possible venous stenosis from defibrillator  . Pericardial effusion      November, 2011 .. decreased during hospitalization  . Eye abnormality     Ophthalmologist questions a clot in one of his eyes, May, 2012    ROS:   All systems reviewed and negative except as noted in the HPI.   Past Surgical History  Procedure Date  . Colonoscopy w/ polypectomy   . Abdominal aortic aneurysm repair   . Coronary artery bypass graft   . Icd      insertion  . Lumbar fusion   . Pilonidal cyst removal   . Surgery scrotal / testicular      Family History  Problem Relation Age of Onset  . Hypertension Mother   . Stroke Mother   . Diabetes Father   . Coronary artery disease Father      History   Social History  . Marital Status: Married    Spouse Name: N/A    Number of Children: N/A  . Years of Education: N/A   Occupational History  . Not on file.   Social History Main Topics  . Smoking status: Former Smoker    Quit date: 03/21/1990  . Smokeless tobacco: Former Neurosurgeon  . Alcohol Use: 0.0 oz/week     beer  . Drug Use: No  . Sexually Active: Not on file   Other Topics Concern  . Not on file   Social History Narrative  . No narrative on file     BP 126/80  Pulse 88  Ht 6' (1.829 m)  Wt 256 lb 12.8 oz (116.484 kg)  BMI 34.83 kg/m2  Physical Exam:  Well appearing obese man, NAD HEENT: Unremarkable Neck:  7 cm JVD, no thyromegally Lymphatics:  No adenopathy Back:  No CVA tenderness Lungs:  Clear except for rales in the bases bilaterally. Well-healed ICD incision HEART:  Regular rate rhythm, grade 2/6 systolic murmur at the left lower sternal border., no rubs, no clicks Abd:  soft, positive bowel sounds, no organomegally, no rebound, no guarding Ext:  2 plus pulses, 1+ edema bilaterally, no cyanosis, no clubbing Skin:  No rashes no nodules Neuro:  CN II through XII intact, motor grossly intact  DEVICE  Normal device function.  See PaceArt for details.   Assess/Plan:

## 2010-12-07 NOTE — Assessment & Plan Note (Signed)
His device is working normally. We'll plan a recheck in several months.

## 2010-12-07 NOTE — Assessment & Plan Note (Signed)
His chronic systolic heart failure in his class IIb. After dietary history has been taken, he is clearly abusing sodium. I have asked him to stop this. Also I have asked the patient to increase his Lasix for 3 days to a total of 120 mg daily then go back to 80 mg daily thereafter

## 2010-12-07 NOTE — Patient Instructions (Signed)
Your physician wants you to follow-up in: 6 months with You will receive a reminder letter in the mail two months in advance. If you don't receive a letter, please call our office to schedule the follow-up appointment.  Remote monitoring is used to monitor your Pacemaker of ICD from home. This monitoring reduces the number of office visits required to check your device to one time per year. It allows [place] to keep an eye on the functioning of your device to ensure it is working properly. You are scheduled for a device check from home on 03/10/11. You may send your transmission at any time that day. If you have a wireless device, the transmission will be sent automatically. After your physician reviews your transmission, you will receive a postcard with your next transmission date.

## 2010-12-07 NOTE — Assessment & Plan Note (Signed)
He has had several episodes with successful anti-tachycardic pacing therapy. He has had no ICD shocks. He will continue his current medical therapy for now.

## 2010-12-09 ENCOUNTER — Encounter: Payer: Self-pay | Admitting: Internal Medicine

## 2010-12-09 ENCOUNTER — Ambulatory Visit (INDEPENDENT_AMBULATORY_CARE_PROVIDER_SITE_OTHER): Payer: Medicare Other | Admitting: Internal Medicine

## 2010-12-09 DIAGNOSIS — L97209 Non-pressure chronic ulcer of unspecified calf with unspecified severity: Secondary | ICD-10-CM

## 2010-12-09 DIAGNOSIS — E119 Type 2 diabetes mellitus without complications: Secondary | ICD-10-CM

## 2010-12-09 MED ORDER — GLUCOSE BLOOD VI STRP: ORAL_STRIP | Status: DC

## 2010-12-09 NOTE — Progress Notes (Signed)
Addended by: Edgardo Roys on: 12/09/2010 12:13 PM   Modules accepted: Orders

## 2010-12-09 NOTE — Patient Instructions (Addendum)
Dip gauze in  sterile saline and applied to the wound twice a day. Cover the wound with Telfa , non stick dressing  Over the antibiotic ( Gentamycin) ointment. The saline can be purchased at the drugstore or you can make your own .Boil cup of salt in a gallon of water. Store mixture  in a clean container.Report Warning  signs as discussed (red streaks, pus, fever, increasing pain).    Goals for home glucose monitoring are : fasting  or morning glucose goal of  90-150. Two hours after any meal , goal = < 180, preferably < 150. Follow the low carb nutrition program in [facility] [employer] as closely as possible to prevent Diabetes progression & complications. White carbohydrates (potatoes, rice, bread, and pasta) have a high spike of sugar and a high load of sugar. For example a  baked potato has a cup of sugar and a  french fry  2 teaspoons of sugar. Yams, wild  rice, whole grained bread &  wheat pasta have been much lower spike and load of  sugar.

## 2010-12-09 NOTE — Progress Notes (Signed)
  Subjective:    Patient ID: [name], male    DOB: 03-04-1941, 70 y.o.   MRN: [id number]  HPI         He is being seen  following  his cellulitis. He has completed his antibiotics. He denies fever, chills, or sweats. There's been no red streaks going up the leg. There is scant clear drainage from the small wound.    Review of Systems FBS  110- 201 ( day after ESI)     Objective:   Physical Exam   He appears well-nourished & in no acute distress.  There is violaceous coloration of the feet with dependency.  Skin severe chronic nail changes bilaterally.  There is 1+ edema to mid shin.  Pedal pulses are decreased; only the right dorsalis pedis pulse is palpable.  There is a 6 x 2.5 mm shallow ulcer over the right posterior calf. There is minimal erythema here and slight tenderness.  The circumference of the right calf is 38 cm and the left 37 cm.        Assessment & Plan:  #1 small shallow ulcer right posterior calf; no evidence of frank cellulitis  #2 diabetes, exacerbation of hyperglycemia following epidural steroid injections.  Plan: See orders and recommendations.

## 2010-12-13 ENCOUNTER — Ambulatory Visit (INDEPENDENT_AMBULATORY_CARE_PROVIDER_SITE_OTHER): Payer: Medicare Other | Admitting: *Deleted

## 2010-12-13 DIAGNOSIS — Z7901 Long term (current) use of anticoagulants: Secondary | ICD-10-CM

## 2010-12-13 DIAGNOSIS — I4891 Unspecified atrial fibrillation: Secondary | ICD-10-CM

## 2010-12-16 ENCOUNTER — Other Ambulatory Visit: Payer: Self-pay | Admitting: Internal Medicine

## 2010-12-16 DIAGNOSIS — I10 Essential (primary) hypertension: Secondary | ICD-10-CM

## 2010-12-16 DIAGNOSIS — E119 Type 2 diabetes mellitus without complications: Secondary | ICD-10-CM

## 2010-12-16 DIAGNOSIS — E876 Hypokalemia: Secondary | ICD-10-CM

## 2010-12-17 ENCOUNTER — Other Ambulatory Visit (INDEPENDENT_AMBULATORY_CARE_PROVIDER_SITE_OTHER): Payer: Medicare Other

## 2010-12-17 DIAGNOSIS — E876 Hypokalemia: Secondary | ICD-10-CM

## 2010-12-17 DIAGNOSIS — E119 Type 2 diabetes mellitus without complications: Secondary | ICD-10-CM

## 2010-12-17 DIAGNOSIS — I10 Essential (primary) hypertension: Secondary | ICD-10-CM

## 2010-12-17 LAB — HEMOGLOBIN A1C: Hgb A1c MFr Bld: 6.8 % — ABNORMAL HIGH (ref 4.6–6.5)

## 2010-12-17 LAB — MICROALBUMIN / CREATININE URINE RATIO
Creatinine,U: 164.8 mg/dL
Microalb, Ur: 4.6 mg/dL — ABNORMAL HIGH (ref 0.0–1.9)

## 2010-12-17 LAB — LIPID PANEL
Cholesterol: 166 mg/dL (ref 0–200)
LDL Cholesterol: 83 mg/dL (ref 0–99)
Triglycerides: 110 mg/dL (ref 0.0–149.0)

## 2010-12-17 NOTE — Progress Notes (Signed)
Labs only

## 2010-12-30 LAB — URINE CULTURE: Colony Count: NO GROWTH

## 2010-12-30 LAB — URINALYSIS, ROUTINE W REFLEX MICROSCOPIC
Bilirubin Urine: NEGATIVE
Ketones, ur: NEGATIVE
Nitrite: NEGATIVE
Protein, ur: NEGATIVE
Urobilinogen, UA: 0.2

## 2010-12-30 LAB — CBC
HCT: 40.7
Hemoglobin: 13.9
MCHC: 34.1
MCV: 92.7
RBC: 4.38
WBC: 7.7

## 2010-12-30 LAB — DIFFERENTIAL
Eosinophils Absolute: 0
Lymphs Abs: 0.9
Monocytes Absolute: 0.3
Monocytes Relative: 4
Neutrophils Relative %: 83 — ABNORMAL HIGH

## 2011-01-04 ENCOUNTER — Encounter: Payer: Medicare Other | Admitting: *Deleted

## 2011-01-04 LAB — POCT I-STAT 3, VENOUS BLOOD GAS (G3P V)
Acid-base deficit: 3 — ABNORMAL HIGH
Bicarbonate: 21.6
Bicarbonate: 24.8 — ABNORMAL HIGH
O2 Saturation: 65
TCO2: 26
pO2, Ven: 34

## 2011-01-04 LAB — POCT I-STAT 3, ART BLOOD GAS (G3+)
Bicarbonate: 20.8
O2 Saturation: 92
TCO2: 22
pCO2 arterial: 31.7 — ABNORMAL LOW
pH, Arterial: 7.425

## 2011-01-06 ENCOUNTER — Ambulatory Visit: Payer: Medicare Other

## 2011-01-06 ENCOUNTER — Ambulatory Visit (INDEPENDENT_AMBULATORY_CARE_PROVIDER_SITE_OTHER): Payer: Medicare Other

## 2011-01-06 DIAGNOSIS — Z23 Encounter for immunization: Secondary | ICD-10-CM

## 2011-01-10 ENCOUNTER — Encounter: Payer: Medicare Other | Admitting: Internal Medicine

## 2011-01-12 ENCOUNTER — Ambulatory Visit (INDEPENDENT_AMBULATORY_CARE_PROVIDER_SITE_OTHER): Payer: Medicare Other | Admitting: *Deleted

## 2011-01-12 DIAGNOSIS — I4891 Unspecified atrial fibrillation: Secondary | ICD-10-CM

## 2011-01-12 DIAGNOSIS — Z7901 Long term (current) use of anticoagulants: Secondary | ICD-10-CM

## 2011-01-18 ENCOUNTER — Encounter: Payer: Self-pay | Admitting: Internal Medicine

## 2011-01-18 ENCOUNTER — Ambulatory Visit (INDEPENDENT_AMBULATORY_CARE_PROVIDER_SITE_OTHER): Payer: Medicare Other | Admitting: Internal Medicine

## 2011-01-18 VITALS — BP 124/90 | HR 82 | Temp 97.9°F | Wt 260.0 lb

## 2011-01-18 DIAGNOSIS — L0390 Cellulitis, unspecified: Secondary | ICD-10-CM

## 2011-01-18 DIAGNOSIS — I83009 Varicose veins of unspecified lower extremity with ulcer of unspecified site: Secondary | ICD-10-CM

## 2011-01-18 DIAGNOSIS — L97909 Non-pressure chronic ulcer of unspecified part of unspecified lower leg with unspecified severity: Secondary | ICD-10-CM

## 2011-01-18 MED ORDER — MUPIROCIN 2 % EX OINT: TOPICAL_OINTMENT | CUTANEOUS | Status: AC

## 2011-01-18 MED ORDER — DOXYCYCLINE HYCLATE 100 MG PO TABS: 100.0000 mg | ORAL_TABLET | Freq: Two times a day (BID) | ORAL | Status: AC

## 2011-01-18 NOTE — Progress Notes (Signed)
  Subjective:    Patient ID: [name], male    DOB: 1940/11/09, 70 y.o.   MRN: [id number]  HPI Lesion Location: R calf  Onset: 1 week ago as red spot   Course: progression to denudation Self-treated with: Gentamicin 0.1% twice a day             mprovement with treatment: no History Pruritis: no  Tenderness: no  Red Flags Feeling ill: no  Fever: no  PMH: RLE  cellulitis Diabetic status: yes, FBS average 135  PT/INR was 1.9 last week       Review of Systems     Objective:   Physical Exam he is in no acute distress.  There is a figure 8 shaped shallow ulceration measuring 10 x 4 mm in the R calf. Below this is a 10 x 10 mm erythematous area at the upper ankle.  Trace edema is noted at the sock line. There  is no tenderness to palpation of the calf.        Assessment & Plan:

## 2011-01-18 NOTE — Patient Instructions (Signed)
Dip gauze in  sterile saline and applied to the wound twice a day. Cover the wound with Telfa , non stick dressing  without any antibiotic ointment. The saline can be purchased at the drugstore or you can make your own .Boil cup of salt in a gallon of water. Store mixture  in a clean container.Report Warning  signs as discussed (red streaks, pus, fever, increasing pain).  Repeat PT/INR in 1 week due to antibiotics

## 2011-01-19 ENCOUNTER — Other Ambulatory Visit: Payer: Self-pay | Admitting: Cardiology

## 2011-01-20 ENCOUNTER — Other Ambulatory Visit: Payer: Self-pay

## 2011-01-20 MED ORDER — AMLODIPINE BESYLATE 2.5 MG PO TABS: 2.5000 mg | ORAL_TABLET | Freq: Every day | ORAL | Status: DC

## 2011-02-02 ENCOUNTER — Ambulatory Visit (INDEPENDENT_AMBULATORY_CARE_PROVIDER_SITE_OTHER): Payer: Medicare Other | Admitting: Cardiology

## 2011-02-02 ENCOUNTER — Encounter: Payer: Self-pay | Admitting: Cardiology

## 2011-02-02 ENCOUNTER — Ambulatory Visit (INDEPENDENT_AMBULATORY_CARE_PROVIDER_SITE_OTHER): Payer: Medicare Other | Admitting: *Deleted

## 2011-02-02 DIAGNOSIS — I2510 Atherosclerotic heart disease of native coronary artery without angina pectoris: Secondary | ICD-10-CM

## 2011-02-02 DIAGNOSIS — Z7901 Long term (current) use of anticoagulants: Secondary | ICD-10-CM

## 2011-02-02 DIAGNOSIS — E8779 Other fluid overload: Secondary | ICD-10-CM

## 2011-02-02 DIAGNOSIS — I4891 Unspecified atrial fibrillation: Secondary | ICD-10-CM

## 2011-02-02 DIAGNOSIS — E663 Overweight: Secondary | ICD-10-CM

## 2011-02-02 DIAGNOSIS — I10 Essential (primary) hypertension: Secondary | ICD-10-CM

## 2011-02-02 MED ORDER — POTASSIUM CHLORIDE CRYS ER 20 MEQ PO TBCR: 20.0000 meq | EXTENDED_RELEASE_TABLET | Freq: Every day | ORAL | Status: DC

## 2011-02-02 MED ORDER — AMLODIPINE BESYLATE 5 MG PO TABS: 5.0000 mg | ORAL_TABLET | Freq: Every day | ORAL | Status: DC

## 2011-02-02 NOTE — Assessment & Plan Note (Signed)
Coronary disease is stable.  No further workup. 

## 2011-02-02 NOTE — Patient Instructions (Signed)
Your physician recommends that you schedule a follow-up appointment in: before [place] Your physician has recommended you make the following change in your medication: INCREASE Amlodipine to 5 mg daily

## 2011-02-02 NOTE — Assessment & Plan Note (Signed)
Patient continues to have a pattern of high blood pressure in the morning and low later in the day.  Previously he had had symptoms during the day if he pushes pressure down too much.  However it is higher than it has been.  His amlodipine dose will be increased to 5 mg.  He'll take this in the evening.

## 2011-02-02 NOTE — Assessment & Plan Note (Addendum)
His volume status is stable. No change in therapy. 

## 2011-02-02 NOTE — Progress Notes (Signed)
HPI  patient is seen today for cardiology followup.  Unfortunately his back is feeling a little bit better.  He is getting injections.  In addition Celebrex has helped.  He is stable with this.  He is gaining weight.  He does not appear to be fluid weight at this time.  He's not having any chest pain or shortness of breath.  Allergies  Allergen Reactions  . Penicillins     REACTION: anaphylaxis    Current Outpatient Prescriptions  Medication Sig Dispense Refill  . ACCU-CHEK SOFTCLIX LANCETS lancets USE AS DIRECTED  100 each  4  . amLODipine (NORVASC) 5 MG tablet Take 1 tablet (5 mg total) by mouth daily.  90 tablet  3  . aspirin 81 MG tablet Take 81 mg by mouth once a week.        . carvedilol (COREG) 25 MG tablet Take 25 mg by mouth 2 (two) times daily with a meal.        . CELEBREX 200 MG capsule       . Cholecalciferol (VITAMIN D PO) Take by mouth daily.        . furosemide (LASIX) 40 MG tablet Take 1 tablet (40 mg total) by mouth 2 (two) times daily. May take extra tablet if needed.  180 tablet  3  . gabapentin (NEURONTIN) 100 MG capsule Take 100 mg by mouth 2 (two) times daily.       Marland Kitchen glimepiride (AMARYL) 2 MG tablet Take by mouth daily before breakfast. 1/2 tab daily       . glucose blood (ACCU-CHEK COMFORT CURVE) test strip Use as instructed  100 each  4  . indomethacin (INDOCIN) 25 MG capsule Take 25 mg by mouth as needed.        Marland Kitchen ipratropium (ATROVENT) 0.06 % nasal spray Place 1 spray into the nose daily.       Marland Kitchen losartan (COZAAR) 50 MG tablet Take 50 mg by mouth 2 (two) times daily.        . metFORMIN (GLUCOPHAGE) 500 MG tablet Take 500 mg by mouth daily.        . methocarbamol (ROBAXIN) 500 MG tablet Take 500 mg by mouth 2 (two) times daily.       . mupirocin (BACTROBAN) 2 % ointment Apply 1 application topically daily.       . nitroGLYCERIN (NITROSTAT) 0.4 MG SL tablet Place 0.4 mg under the tongue every 5 (five) minutes as needed.        . potassium chloride (KLOR-CON) 20  MEQ packet Take 20 mEq by mouth. 1/2 by mouth twice daily      . potassium chloride SA (KLOR-CON M20) 20 MEQ tablet Take 1 tablet (20 mEq total) by mouth daily.  90 tablet  3  . simvastatin (ZOCOR) 40 MG tablet Take 1 tablet (40 mg total) by mouth at bedtime.  90 tablet  1  . traMADol (ULTRAM) 50 MG tablet as needed.      . warfarin (COUMADIN) 2.5 MG tablet USE AS DIRECTED BY ANTICOAGULATION CLINIC  130 tablet  1  . doxycycline (VIBRA-TABS) 100 MG tablet Take 1 tablet (100 mg total) by mouth 2 (two) times daily.  14 tablet  0    History   Social History  . Marital Status: Married    Spouse Name: N/A    Number of Children: N/A  . Years of Education: N/A   Occupational History  . Not on file.   Social History Main  Topics  . Smoking status: Former Smoker    Quit date: 03/21/1990  . Smokeless tobacco: Former Neurosurgeon  . Alcohol Use: 0.0 oz/week     beer  . Drug Use: No  . Sexually Active: Not on file   Other Topics Concern  . Not on file   Social History Narrative  . No narrative on file    Family History  Problem Relation Age of Onset  . Hypertension Mother   . Stroke Mother   . Diabetes Father   . Coronary artery disease Father     Past Medical History  Diagnosis Date  . Diabetes mellitus   . Hyperlipidemia   . Hypertension   . Pilonidal cyst   . Atrial fibrillation     Previous long-term amiodarone therapy with multiple cardioversions / amiodarone stopped September, 2009  . Atrial flutter     Started November, 2010, Left-sided and cannot ablate  . Left atrial thrombus     Remote past... cardioversions done since that time  . COPD (chronic obstructive pulmonary disease)     PFTs.. relatively good function 2010  . Wide-complex tachycardia   . Left ventricular ejection fraction less than 40%   . Gout   . AAA (abdominal aortic aneurysm)     Surgical repair  . Discolored skin   . S/P ICD (internal cardiac defibrillator) procedure     Dr. Ladona Ridgel 2009... by the  pacing  . SOB (shortness of breath)     Large left effusion/ thoracentesis/hospitalization/November, 2011... exudated.. cytology negative.. Dr.Wert.. no proof of mesothelioma  . Pericardial effusion   . Pleural effusion   . Renal insufficiency   . S/P AAA repair   . Spinal stenosis     Surgery Dr.Elsner  . CAD (coronary artery disease)     Catheterization July, 2008... name and vein grafts patent but low cardiac output  . Warfarin anticoagulation   . Cardiomyopathy     Ischemic... ICD  . CHF (congestive heart failure)     EF 30-40%... echo.. November, 2008  /  EF 55-60% echo... November, 2011  . Venous insufficiency     Toe discoloration chronic  . Mitral regurgitation     Mild echo  . Aortic valve sclerosis   . Nasal drainage     Chronic  . Alcohol ingestion of more than four drinks per week     Excess beer  not a dependency problem  . Chronotropic incompetence     IV pacing rate adjusted  . Thrombophlebitis of superficial veins of upper extremities     Possible venous stenosis from defibrillator  . Pericardial effusion     November, 2011 .. decreased during hospitalization  . Eye abnormality     Ophthalmologist questions a clot in one of his eyes, May, 2012    Past Surgical History  Procedure Date  . Colonoscopy w/ polypectomy   . Abdominal aortic aneurysm repair   . Coronary artery bypass graft   . Icd      insertion  . Lumbar fusion   . Pilonidal cyst removal   . Surgery scrotal / testicular     ROS  Patient denies fever, chills, headache, sweats, rash, change in vision, change in hearing, chest pain, cough, nausea vomiting, urinary symptoms.  All other systems are reviewed and are negative.  PHYSICAL EXAM Patient is overweight.  His abdomen is protuberant.  He is stable.  He is here with his wife.  There is no jugulovenous distention.  Lungs are  clear.  Respiratory effort is nonlabored.  Cardiac exam reveals S1-S2.  No clicks or significant murmurs.  The  abdomen is protuberant.  He has no significant peripheral edema.  There no skin rashes.  There are no musculoskeletal deformities. Filed Vitals:   02/02/11 1406  BP: 120/90  Pulse: 87  Resp: 18  Height: 6' (1.829 m)  Weight: 259 lb 12.8 oz (117.845 kg)      ASSESSMENT & PLAN

## 2011-02-02 NOTE — Assessment & Plan Note (Signed)
The patient has gained weight.  I have talked to him about cutting his calories.  He also needs to decrease his alcohol intake.  We talked about this carefully.

## 2011-02-21 ENCOUNTER — Other Ambulatory Visit: Payer: Self-pay | Admitting: Physician Assistant

## 2011-02-28 ENCOUNTER — Ambulatory Visit (INDEPENDENT_AMBULATORY_CARE_PROVIDER_SITE_OTHER): Payer: Medicare Other | Admitting: Internal Medicine

## 2011-02-28 DIAGNOSIS — IMO0002 Reserved for concepts with insufficient information to code with codable children: Secondary | ICD-10-CM

## 2011-02-28 DIAGNOSIS — I4891 Unspecified atrial fibrillation: Secondary | ICD-10-CM

## 2011-02-28 DIAGNOSIS — Z7901 Long term (current) use of anticoagulants: Secondary | ICD-10-CM

## 2011-02-28 NOTE — Patient Instructions (Signed)
Use warm moist compresses to 3 times a day to the affected area. Keep the right forearm elevated as much as possible until the edema resolves. Report  Warning Signs as discussed (pain , pus, red streaks & fever).
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
## 2011-03-08 ENCOUNTER — Telehealth: Payer: Self-pay | Admitting: Internal Medicine

## 2011-03-08 NOTE — Telephone Encounter (Signed)
New Problem:    Patient did not wish to disclose the nature of her call. Please call back.

## 2011-03-09 NOTE — Telephone Encounter (Signed)
Spoke with pt's wife and got letter straightened out. Pt is not delinquent on checks. Pt due to send transmission on 03-10-11. Wife is aware of this and to call office to make sure transmission was received.

## 2011-03-10 ENCOUNTER — Encounter: Payer: Self-pay | Admitting: Cardiology

## 2011-03-10 ENCOUNTER — Ambulatory Visit (INDEPENDENT_AMBULATORY_CARE_PROVIDER_SITE_OTHER): Payer: Medicare Other | Admitting: Cardiology

## 2011-03-10 ENCOUNTER — Encounter: Payer: Self-pay | Admitting: Internal Medicine

## 2011-03-10 ENCOUNTER — Ambulatory Visit (INDEPENDENT_AMBULATORY_CARE_PROVIDER_SITE_OTHER): Payer: Medicare Other | Admitting: *Deleted

## 2011-03-10 ENCOUNTER — Encounter: Payer: Medicare Other | Admitting: *Deleted

## 2011-03-10 DIAGNOSIS — I10 Essential (primary) hypertension: Secondary | ICD-10-CM

## 2011-03-10 DIAGNOSIS — Z7901 Long term (current) use of anticoagulants: Secondary | ICD-10-CM

## 2011-03-10 DIAGNOSIS — I428 Other cardiomyopathies: Secondary | ICD-10-CM

## 2011-03-10 DIAGNOSIS — I5023 Acute on chronic systolic (congestive) heart failure: Secondary | ICD-10-CM

## 2011-03-10 DIAGNOSIS — I4891 Unspecified atrial fibrillation: Secondary | ICD-10-CM

## 2011-03-10 LAB — ICD DEVICE OBSERVATION
AL AMPLITUDE: 0.75 mv
ATRIAL PACING ICD: 0.13 pct
FVT: 0
LV LEAD THRESHOLD: 2.5 V
PACEART VT: 0
TZAT-0001ATACH: 1
TZAT-0002ATACH: NEGATIVE
TZAT-0002ATACH: NEGATIVE
TZAT-0004FASTVT: 8
TZAT-0005SLOWVT: 91 pct
TZAT-0011SLOWVT: 10 ms
TZAT-0011SLOWVT: 10 ms
TZAT-0012ATACH: 150 ms
TZAT-0012FASTVT: 200 ms
TZAT-0012SLOWVT: 200 ms
TZAT-0012SLOWVT: 200 ms
TZAT-0013FASTVT: 1
TZAT-0018ATACH: NEGATIVE
TZAT-0018SLOWVT: NEGATIVE
TZAT-0018SLOWVT: NEGATIVE
TZAT-0019ATACH: 6 V
TZAT-0019ATACH: 6 V
TZAT-0019ATACH: 6 V
TZAT-0019SLOWVT: 8 V
TZAT-0019SLOWVT: 8 V
TZAT-0020ATACH: 1.5 ms
TZAT-0020ATACH: 1.5 ms
TZAT-0020FASTVT: 1.5 ms
TZON-0003SLOWVT: 340 ms
TZON-0004SLOWVT: 24
TZON-0005SLOWVT: 12
TZST-0001ATACH: 4
TZST-0001ATACH: 5
TZST-0001FASTVT: 4
TZST-0001FASTVT: 5
TZST-0001SLOWVT: 4
TZST-0001SLOWVT: 6
TZST-0002ATACH: NEGATIVE
TZST-0002ATACH: NEGATIVE
TZST-0003FASTVT: 35 J
TZST-0003FASTVT: 35 J
TZST-0003SLOWVT: 15 J
TZST-0003SLOWVT: 35 J
VENTRICULAR PACING ICD: 63.6 pct
VF: 0

## 2011-03-10 MED ORDER — GABAPENTIN 100 MG PO CAPS: 100.0000 mg | ORAL_CAPSULE | Freq: Three times a day (TID) | ORAL | Status: DC

## 2011-03-10 MED ORDER — LOSARTAN POTASSIUM 50 MG PO TABS: 50.0000 mg | ORAL_TABLET | Freq: Two times a day (BID) | ORAL | Status: DC

## 2011-03-10 NOTE — Assessment & Plan Note (Signed)
Patient volume status is stable. No change in therapy.

## 2011-03-10 NOTE — Assessment & Plan Note (Signed)
The patient's biventricular pacer was interrogated today. He has underlying atrial fib. I do not have plans to try to cardiovert him. His pacer was reprogrammed to VVIR.

## 2011-03-10 NOTE — Progress Notes (Signed)
ICD check with ICM by industry for research

## 2011-03-10 NOTE — Progress Notes (Signed)
HPI  Patient is seen for followup of multiple cardiac problems. His cardiac status is stable. I had increased the dose of his amlodipine. He continues to have elevated blood pressure early in the day. Then later in the day his pressure comes down. We are as stable as we will get at this point. He's not having any chest pain or shortness of breath.    Allergies  Allergen Reactions  . Penicillins     REACTION: anaphylaxis    Current Outpatient Prescriptions  Medication Sig Dispense Refill  . ACCU-CHEK SOFTCLIX LANCETS lancets USE AS DIRECTED  100 each  4  . amLODipine (NORVASC) 5 MG tablet Take 1 tablet (5 mg total) by mouth daily.  90 tablet  3  . aspirin 81 MG tablet Take 81 mg by mouth once a week.        . carvedilol (COREG) 25 MG tablet Take 25 mg by mouth 2 (two) times daily with a meal.        . CELEBREX 200 MG capsule 2 (two) times daily.       . Cholecalciferol (VITAMIN D PO) Take by mouth daily.        . furosemide (LASIX) 40 MG tablet Take 1 tablet (40 mg total) by mouth 2 (two) times daily. May take extra tablet if needed.  180 tablet  3  . gabapentin (NEURONTIN) 100 MG capsule Take 100 mg by mouth 2 (two) times daily.       Marland Kitchen glimepiride (AMARYL) 2 MG tablet Take by mouth daily before breakfast. 1/2 tab daily       . glucose blood (ACCU-CHEK COMFORT CURVE) test strip Use as instructed  100 each  4  . indomethacin (INDOCIN) 25 MG capsule Take 25 mg by mouth as needed.        Marland Kitchen ipratropium (ATROVENT) 0.06 % nasal spray Place 1 spray into the nose daily.       Marland Kitchen losartan (COZAAR) 50 MG tablet Take 50 mg by mouth 2 (two) times daily.        . metFORMIN (GLUCOPHAGE) 500 MG tablet Take 500 mg by mouth daily.        . methocarbamol (ROBAXIN) 500 MG tablet Take 500 mg by mouth 2 (two) times daily.       . nitroGLYCERIN (NITROSTAT) 0.4 MG SL tablet Place 0.4 mg under the tongue every 5 (five) minutes as needed.        . potassium chloride (KLOR-CON) 20 MEQ packet Take 20 mEq by mouth.  1/2 by mouth twice daily      . simvastatin (ZOCOR) 40 MG tablet Take 1 tablet (40 mg total) by mouth at bedtime.  90 tablet  1  . warfarin (COUMADIN) 2.5 MG tablet USE AS DIRECTED BY ANTICOAGULATION CLINIC  130 tablet  1    History   Social History  . Marital Status: Married    Spouse Name: N/A    Number of Children: N/A  . Years of Education: N/A   Occupational History  . Not on file.   Social History Main Topics  . Smoking status: Former Smoker    Quit date: 03/21/1990  . Smokeless tobacco: Former Neurosurgeon  . Alcohol Use: 0.0 oz/week     beer  . Drug Use: No  . Sexually Active: Not on file   Other Topics Concern  . Not on file   Social History Narrative  . No narrative on file    Family History  Problem Relation  Age of Onset  . Hypertension Mother   . Stroke Mother   . Diabetes Father   . Coronary artery disease Father     Past Medical History  Diagnosis Date  . Diabetes mellitus   . Hyperlipidemia   . Hypertension   . Pilonidal cyst   . Atrial fibrillation     Previous long-term amiodarone therapy with multiple cardioversions / amiodarone stopped September, 2009  . Atrial flutter     Started November, 2010, Left-sided and cannot ablate  . Left atrial thrombus     Remote past... cardioversions done since that time  . COPD (chronic obstructive pulmonary disease)     PFTs.. relatively good function 2010  . Wide-complex tachycardia   . Left ventricular ejection fraction less than 40%   . Gout   . AAA (abdominal aortic aneurysm)     Surgical repair  . Discolored skin   . S/P ICD (internal cardiac defibrillator) procedure     Dr. Ladona Ridgel 2009... by the pacing  . SOB (shortness of breath)     Large left effusion/ thoracentesis/hospitalization/November, 2011... exudated.. cytology negative.. Dr.Wert.. no proof of mesothelioma  . Pericardial effusion   . Pleural effusion   . Renal insufficiency   . S/P AAA repair   . Spinal stenosis     Surgery Dr.Elsner  .  CAD (coronary artery disease)     Catheterization July, 2008... name and vein grafts patent but low cardiac output  . Warfarin anticoagulation   . Cardiomyopathy     Ischemic... ICD  . CHF (congestive heart failure)     EF 30-40%... echo.. November, 2008  /  EF 55-60% echo... November, 2011  . Venous insufficiency     Toe discoloration chronic  . Mitral regurgitation     Mild echo  . Aortic valve sclerosis   . Nasal drainage     Chronic  . Alcohol ingestion of more than four drinks per week     Excess beer  not a dependency problem  . Chronotropic incompetence     IV pacing rate adjusted  . Thrombophlebitis of superficial veins of upper extremities     Possible venous stenosis from defibrillator  . Pericardial effusion     November, 2011 .. decreased during hospitalization  . Eye abnormality     Ophthalmologist questions a clot in one of his eyes, May, 2012  . Overweight     November, 2012    Past Surgical History  Procedure Date  . Colonoscopy w/ polypectomy   . Abdominal aortic aneurysm repair   . Coronary artery bypass graft   . Icd      insertion  . Lumbar fusion   . Pilonidal cyst removal   . Surgery scrotal / testicular     ROS  Patient denies fever, chills, headache, sweats, rash, change in vision, change in hearing, chest pain, cough, nausea vomiting, urinary symptoms. All other systems are reviewed and are negative.  PHYSICAL EXAM  Patient is stable today. He has gained some weight. Unfortunately he is not able to walk because of his back pain. He is oriented to person time and place. Affect is normal. He's here with his wife. Lungs are clear. Respiratory effort is nonlabored. Cardiac exam reveals S1 and S2. There no clicks or significant murmurs. The abdomen is protuberant but soft. There is no peripheral edema.  Filed Vitals:   03/10/11 1420  BP: 135/85  Pulse: 91  Height: 6' (1.829 m)  Weight: 267 lb 12.8  oz (121.473 kg)    ASSESSMENT & PLAN

## 2011-03-10 NOTE — Assessment & Plan Note (Signed)
Coronary disease is stable. No change in therapy. 

## 2011-03-10 NOTE — Assessment & Plan Note (Signed)
The patient has an ongoing problem with elevated blood pressure the morning. He then takes his meds and as the day goes on his pressure actually moves to the low side. We have. Carefully adjusted his medicines for this. No change in therapy.

## 2011-03-10 NOTE — Patient Instructions (Signed)
Your physician wants you to follow-up in: 3 months. You will receive a reminder letter in the mail two months in advance. If you don't receive a letter, please call our office to schedule the follow-up appointment.   

## 2011-03-12 ENCOUNTER — Other Ambulatory Visit: Payer: Self-pay | Admitting: Physician Assistant

## 2011-03-17 ENCOUNTER — Telehealth: Payer: Self-pay

## 2011-03-17 NOTE — Telephone Encounter (Signed)
Not maintenance med; long term use can increase cardiac & GI risks

## 2011-03-18 ENCOUNTER — Encounter: Payer: Self-pay | Admitting: Internal Medicine

## 2011-03-23 ENCOUNTER — Telehealth: Payer: Self-pay

## 2011-03-23 NOTE — Telephone Encounter (Signed)
Left message on voicemail with Dr.Hopper's response  

## 2011-03-23 NOTE — Telephone Encounter (Signed)
Message left on voicemail: are swelling, please call back.  Spoke with patient's wife, patient with swelling in right arm, patient's wife states [place] are at the beach in [facility]. I informed patient's wife, patient to be seen at Urgent care or emergency room in [facility]. Patient's wife states she would rather patient see Dr.Hopper, they can leave from the beach tomorrow and see Hopp on Friday. Patient's wife then mentioned she would like to know if Dr.Hopper would rx Doxy (just in case this is cellulitis), patient will still f/u with Dr.Hopper on Friday.  Appointment scheduled for Friday @ 12:30, Dr.Hopper please further advise if ok to rx Doxy?

## 2011-03-23 NOTE — Telephone Encounter (Signed)
Cellulitis in the arm would be unlikely; with all the comorbidities he has, I do recommend being checked at an urgent care rather than waiting

## 2011-03-23 NOTE — Telephone Encounter (Signed)
Left another message on voicemail to f/u and confirm patient seen at urgent care or ED

## 2011-03-24 NOTE — Telephone Encounter (Signed)
Patient's wife called back, patient went to emergency room, DX with cellulitis in arm, given ABX (IV), patient was also given Keflex 500 mg 4 x daily. Appointment scheduled to f/u on Monday

## 2011-03-25 ENCOUNTER — Ambulatory Visit: Payer: Medicare Other | Admitting: Internal Medicine

## 2011-03-28 ENCOUNTER — Telehealth: Payer: Self-pay

## 2011-03-28 ENCOUNTER — Ambulatory Visit (INDEPENDENT_AMBULATORY_CARE_PROVIDER_SITE_OTHER): Payer: Medicare Other | Admitting: Internal Medicine

## 2011-03-28 ENCOUNTER — Encounter: Payer: Self-pay | Admitting: Internal Medicine

## 2011-03-28 ENCOUNTER — Ambulatory Visit (INDEPENDENT_AMBULATORY_CARE_PROVIDER_SITE_OTHER)
Admission: RE | Admit: 2011-03-28 | Discharge: 2011-03-28 | Disposition: A | Discharge status: Home / Self Care | Payer: Medicare Other | Source: Ambulatory Visit | Attending: Internal Medicine | Admitting: Internal Medicine

## 2011-03-28 ENCOUNTER — Other Ambulatory Visit: Payer: Self-pay | Admitting: Internal Medicine

## 2011-03-28 DIAGNOSIS — L0390 Cellulitis, unspecified: Secondary | ICD-10-CM

## 2011-03-28 DIAGNOSIS — R609 Edema, unspecified: Secondary | ICD-10-CM

## 2011-03-28 DIAGNOSIS — I4891 Unspecified atrial fibrillation: Secondary | ICD-10-CM

## 2011-03-28 DIAGNOSIS — R600 Localized edema: Secondary | ICD-10-CM

## 2011-03-28 DIAGNOSIS — Z7901 Long term (current) use of anticoagulants: Secondary | ICD-10-CM

## 2011-03-28 DIAGNOSIS — M25529 Pain in unspecified elbow: Secondary | ICD-10-CM

## 2011-03-28 LAB — CBC WITH DIFFERENTIAL/PLATELET
Eosinophils Absolute: 0.1 10*3/uL (ref 0.0–0.7)
Eosinophils Relative: 1.4 % (ref 0.0–5.0)
HCT: 45.3 % (ref 39.0–52.0)
Lymphs Abs: 0.9 10*3/uL (ref 0.7–4.0)
MCHC: 33.8 g/dL (ref 30.0–36.0)
MCV: 95.9 fl (ref 78.0–100.0)
Monocytes Absolute: 0.7 10*3/uL (ref 0.1–1.0)
Neutrophils Relative %: 78.9 % — ABNORMAL HIGH (ref 43.0–77.0)
Platelets: 181 10*3/uL (ref 150.0–400.0)

## 2011-03-28 LAB — BUN: BUN: 26 mg/dL — ABNORMAL HIGH (ref 6–23)

## 2011-03-28 LAB — CREATININE, SERUM: Creatinine, Ser: 1.5 mg/dL (ref 0.4–1.5)

## 2011-03-28 MED ORDER — IOHEXOL 300 MG/ML  SOLN
100.0000 mL | Freq: Once | INTRAMUSCULAR | Status: AC | PRN
  Administered 2011-03-28: 100 mL via INTRAVENOUS

## 2011-03-28 MED ORDER — DOXYCYCLINE HYCLATE 100 MG PO TABS: 100.0000 mg | ORAL_TABLET | Freq: Two times a day (BID) | ORAL | Status: DC

## 2011-03-28 MED ORDER — DOXYCYCLINE HYCLATE 100 MG PO TABS: 100.0000 mg | ORAL_TABLET | Freq: Two times a day (BID) | ORAL | Status: AC

## 2011-03-28 NOTE — Progress Notes (Signed)
  Subjective:    Patient ID: [name], male    DOB: Nov 02, 1940, 71 y.o.   MRN: [id number]  HPI He has had mild swelling of the right hand and forearm since early December 2012. Last week there was marked progression of the edema with leakage of clear material from the lateral mid forearm. There was also some tenderness to touch and mild erythema 12/31.  He was seen in [place] in the emergency room 03/23/11 and diagnosed with cellulitis. Despite a history of anaphylaxis with penicillin; cellulitis was treated with Keflex. Despite the antibiotic the swelling has progressed.  He denies any fever, chills, sweats or purulent secretions. The swelling has progressed particularly at the right elbow.  His last PT/INR was 12/20; it was therapeutic at 2.5    Review of Systems he also has pain in the left elbow. He does have a history of gout.  He has an epidural steroid injection scheduled 1/8. He's been off his Coumadin for 4 days in anticipation of this procedure. He states that this has been the program for each epidural steroid injection as per his cardiologist and back specialist     Objective:   Physical Exam he is in no acute distress.  There is no lymphadenopathy about the neck or axilla.  He also has small erythematous areas over the nose.  Chest is clear with no abnormal breath sounds or increased work of breathing.  Heart sounds are distant but essentially regular with no significant murmur.  There is visible swelling of the right forearm compared to the left. There is slight tenderness to palpation over the dorsal forearm and right ulnar bursa. The ulnar bursa is slightly doughy. There is minimal erythema at the right elbow.  Pedal pulses are decreased. There is 1+ edema @  right lower extremity         Assessment & Plan:  #1 marked edema of the right forearm and pain in with nonpurulent drainage. His last PT/INR was therapeutic; this will need to be  rechecked. Venous thrombosis must be ruled out. This is essential in  view of his having discontinued his Coumadin 4 days ago  #2 mild erythema and tenderness at the right ulnar bursa; rule out cellulitis.  CT angiogram will be performed of the right upper extremity; MRI is not an option because of his defibrillator. PT INR will be checked. Cephalexin will be discontinued &  Doxycycline ordered.

## 2011-03-28 NOTE — Telephone Encounter (Signed)
Patient aware of CT Results, per Dr.Hopper U/S to be done tomorrow.

## 2011-03-28 NOTE — Patient Instructions (Addendum)
Stop the Keflex and start the doxycycline. PT/INR will be needed after 5 days of doxycycline. It is essential that we rule out clot in the right arm as you have been off  Coumadin for 4 days. Treating such would take precedent over the epidural steroid injection

## 2011-03-28 NOTE — Progress Notes (Signed)
Addended by: Candie Echevaria L on: 03/28/2011 09:46 AM   Modules accepted: Orders

## 2011-03-29 ENCOUNTER — Ambulatory Visit (INDEPENDENT_AMBULATORY_CARE_PROVIDER_SITE_OTHER): Payer: Medicare Other | Admitting: *Deleted

## 2011-03-29 ENCOUNTER — Other Ambulatory Visit: Payer: Self-pay | Admitting: *Deleted

## 2011-03-29 DIAGNOSIS — M79609 Pain in unspecified limb: Secondary | ICD-10-CM

## 2011-03-29 DIAGNOSIS — R600 Localized edema: Secondary | ICD-10-CM

## 2011-03-29 DIAGNOSIS — R609 Edema, unspecified: Secondary | ICD-10-CM

## 2011-04-04 ENCOUNTER — Encounter: Payer: Self-pay | Admitting: *Deleted

## 2011-04-04 ENCOUNTER — Ambulatory Visit (INDEPENDENT_AMBULATORY_CARE_PROVIDER_SITE_OTHER): Payer: Medicare Other | Admitting: *Deleted

## 2011-04-04 DIAGNOSIS — Z7901 Long term (current) use of anticoagulants: Secondary | ICD-10-CM

## 2011-04-04 DIAGNOSIS — I4891 Unspecified atrial fibrillation: Secondary | ICD-10-CM

## 2011-04-04 LAB — POCT INR: INR: 1.7

## 2011-04-04 MED ORDER — COLCHICINE 0.6 MG PO TABS: 0.6000 mg | ORAL_TABLET | Freq: Every day | ORAL | Status: DC

## 2011-04-04 NOTE — Patient Instructions (Addendum)
Take 7.5 mg today; then continue 1 [2.5 mg total] pill everyday except 2 [5 mg total] pills on Wednesdays and Saturdays.  Office visit 01.15.13  [4:00pm] per Dr Hopper//[facility]

## 2011-04-05 ENCOUNTER — Ambulatory Visit (INDEPENDENT_AMBULATORY_CARE_PROVIDER_SITE_OTHER): Payer: Medicare Other | Admitting: Internal Medicine

## 2011-04-05 ENCOUNTER — Encounter: Payer: Self-pay | Admitting: Internal Medicine

## 2011-04-05 VITALS — BP 126/82 | HR 64 | Temp 97.6°F | Wt 266.8 lb

## 2011-04-05 DIAGNOSIS — R609 Edema, unspecified: Secondary | ICD-10-CM

## 2011-04-05 DIAGNOSIS — R600 Localized edema: Secondary | ICD-10-CM

## 2011-04-05 NOTE — Patient Instructions (Signed)
.  Share results with Dr Norris  

## 2011-04-05 NOTE — Progress Notes (Signed)
Addended byMarga Melnick F on: 04/05/2011 05:16 PM   Modules accepted: Orders

## 2011-04-05 NOTE — Progress Notes (Signed)
  Subjective:    Patient ID: [name], male    DOB: Apr 09, 1940, 71 y.o.   MRN: [id number]  HPI  There has been no significant change in the edema of the right upper extremity despite the doxycycline. MRI cannot be performed because of his defibrillator; CAT scan  suggested possible cellulitis.  Ultrasound did not reveal any clots. He had been off his Coumadin because of an epidural  steroid injection. His PT/INR was 1.7 on 01/14. Coumadin was adjusted.  Uric acid was  10. He started Colcrys 1/14, but he is not seeing any improvement at this point.    Review of Systems   He makes a comment that he doesn't even realize edema is there. Specifically he denies fever, chills, sweats , pain or limitation of ROM.  He states that the right hand is swollen significantly in the mornings.       Objective:   Physical Exam he is in no acute distress.  There is no lymphadenopathy about the neck, axilla, or the epitrochlear areas.  There is a brawny texture to the medial elbow area without tenderness. There is increased subcutaneous tissue at the right olecranon. This is not tender to palpation. The forearm is visibly swollen. There is no tenderness to compression. There is no significant change in color or temperature except for faint erythema over the medial elbow area.  There is appearance of exfoliation at the right olecranon area.       Assessment & Plan:    #1 edema of the right forearm, essentially asymptomatic. CAT scan suggested possible cellulitis versus "dependent change". He seen no improvement with the doxycycline. He does have a significant elevation in his uric acid with increased risk of acute gout. There is no evidence of deep venous thrombosis. Clinically significant cellulitis is not suspect.  Plan: I would recommend consultation with an orthopedic specialist. If fluid can be aspirated from the olecranon bursa, this may be diagnostic if urate acid crystals or  purulent material were documented.  He will continue with the colchicine until seen by orthopedics. He will complete the doxycycline.

## 2011-04-07 ENCOUNTER — Encounter: Payer: Medicare Other | Admitting: *Deleted

## 2011-04-19 ENCOUNTER — Ambulatory Visit (INDEPENDENT_AMBULATORY_CARE_PROVIDER_SITE_OTHER): Payer: Medicare Other | Admitting: Pharmacist

## 2011-04-19 DIAGNOSIS — I4891 Unspecified atrial fibrillation: Secondary | ICD-10-CM

## 2011-04-19 DIAGNOSIS — Z7901 Long term (current) use of anticoagulants: Secondary | ICD-10-CM

## 2011-05-17 ENCOUNTER — Ambulatory Visit (INDEPENDENT_AMBULATORY_CARE_PROVIDER_SITE_OTHER): Payer: Medicare Other | Admitting: *Deleted

## 2011-05-17 DIAGNOSIS — I4891 Unspecified atrial fibrillation: Secondary | ICD-10-CM

## 2011-05-17 DIAGNOSIS — Z7901 Long term (current) use of anticoagulants: Secondary | ICD-10-CM

## 2011-05-23 ENCOUNTER — Telehealth: Payer: Self-pay | Admitting: Cardiology

## 2011-05-23 NOTE — Telephone Encounter (Signed)
New msg Pt's wife called.she said he has been weak and she wanted to talk to you. Please call

## 2011-05-23 NOTE — Telephone Encounter (Signed)
[name] is complaining of sob, "bad" cough and weakness since Wed.  He is also down 10# (approx) during the past week.  Pt denies swelling or pain.  He has been taking mucinex.  I recommended that he go to Urgent Care today or call his pcp.  Pt refuses and states they want Dr Myrtis Ser to advise them on what to do or see him on Thursday.  Please advise.

## 2011-05-24 ENCOUNTER — Encounter (HOSPITAL_BASED_OUTPATIENT_CLINIC_OR_DEPARTMENT_OTHER): Payer: Self-pay | Admitting: *Deleted

## 2011-05-24 ENCOUNTER — Emergency Department (INDEPENDENT_AMBULATORY_CARE_PROVIDER_SITE_OTHER): Payer: Medicare Other

## 2011-05-24 ENCOUNTER — Inpatient Hospital Stay (HOSPITAL_BASED_OUTPATIENT_CLINIC_OR_DEPARTMENT_OTHER)
Admission: EM | Admit: 2011-05-24 | Discharge: 2011-05-27 | DRG: 291 | Disposition: A | Discharge status: Home / Self Care | Payer: Medicare Other | Attending: Internal Medicine | Admitting: Internal Medicine

## 2011-05-24 ENCOUNTER — Other Ambulatory Visit: Payer: Self-pay

## 2011-05-24 ENCOUNTER — Encounter: Payer: Self-pay | Admitting: Family

## 2011-05-24 ENCOUNTER — Telehealth: Payer: Self-pay | Admitting: *Deleted

## 2011-05-24 ENCOUNTER — Ambulatory Visit (INDEPENDENT_AMBULATORY_CARE_PROVIDER_SITE_OTHER): Payer: Medicare Other | Admitting: Family

## 2011-05-24 DIAGNOSIS — IMO0002 Reserved for concepts with insufficient information to code with codable children: Secondary | ICD-10-CM

## 2011-05-24 DIAGNOSIS — Z7982 Long term (current) use of aspirin: Secondary | ICD-10-CM

## 2011-05-24 DIAGNOSIS — Z79899 Other long term (current) drug therapy: Secondary | ICD-10-CM

## 2011-05-24 DIAGNOSIS — I714 Abdominal aortic aneurysm, without rupture, unspecified: Secondary | ICD-10-CM

## 2011-05-24 DIAGNOSIS — Z789 Other specified health status: Secondary | ICD-10-CM

## 2011-05-24 DIAGNOSIS — I340 Nonrheumatic mitral (valve) insufficiency: Secondary | ICD-10-CM

## 2011-05-24 DIAGNOSIS — I313 Pericardial effusion (noninflammatory): Secondary | ICD-10-CM

## 2011-05-24 DIAGNOSIS — I129 Hypertensive chronic kidney disease with stage 1 through stage 4 chronic kidney disease, or unspecified chronic kidney disease: Secondary | ICD-10-CM | POA: Diagnosis present

## 2011-05-24 DIAGNOSIS — I808 Phlebitis and thrombophlebitis of other sites: Secondary | ICD-10-CM

## 2011-05-24 DIAGNOSIS — R0989 Other specified symptoms and signs involving the circulatory and respiratory systems: Secondary | ICD-10-CM

## 2011-05-24 DIAGNOSIS — E119 Type 2 diabetes mellitus without complications: Secondary | ICD-10-CM

## 2011-05-24 DIAGNOSIS — Z8249 Family history of ischemic heart disease and other diseases of the circulatory system: Secondary | ICD-10-CM

## 2011-05-24 DIAGNOSIS — E663 Overweight: Secondary | ICD-10-CM

## 2011-05-24 DIAGNOSIS — I872 Venous insufficiency (chronic) (peripheral): Secondary | ICD-10-CM

## 2011-05-24 DIAGNOSIS — Z951 Presence of aortocoronary bypass graft: Secondary | ICD-10-CM

## 2011-05-24 DIAGNOSIS — J40 Bronchitis, not specified as acute or chronic: Secondary | ICD-10-CM

## 2011-05-24 DIAGNOSIS — Z23 Encounter for immunization: Secondary | ICD-10-CM

## 2011-05-24 DIAGNOSIS — I4589 Other specified conduction disorders: Secondary | ICD-10-CM | POA: Insufficient documentation

## 2011-05-24 DIAGNOSIS — J189 Pneumonia, unspecified organism: Secondary | ICD-10-CM

## 2011-05-24 DIAGNOSIS — I4891 Unspecified atrial fibrillation: Secondary | ICD-10-CM

## 2011-05-24 DIAGNOSIS — I82409 Acute embolism and thrombosis of unspecified deep veins of unspecified lower extremity: Secondary | ICD-10-CM

## 2011-05-24 DIAGNOSIS — Z7901 Long term (current) use of anticoagulants: Secondary | ICD-10-CM

## 2011-05-24 DIAGNOSIS — E860 Dehydration: Secondary | ICD-10-CM

## 2011-05-24 DIAGNOSIS — R05 Cough: Secondary | ICD-10-CM

## 2011-05-24 DIAGNOSIS — I2510 Atherosclerotic heart disease of native coronary artery without angina pectoris: Secondary | ICD-10-CM

## 2011-05-24 DIAGNOSIS — K5730 Diverticulosis of large intestine without perforation or abscess without bleeding: Secondary | ICD-10-CM

## 2011-05-24 DIAGNOSIS — R079 Chest pain, unspecified: Secondary | ICD-10-CM

## 2011-05-24 DIAGNOSIS — J929 Pleural plaque without asbestos: Secondary | ICD-10-CM

## 2011-05-24 DIAGNOSIS — I513 Intracardiac thrombosis, not elsewhere classified: Secondary | ICD-10-CM

## 2011-05-24 DIAGNOSIS — N289 Disorder of kidney and ureter, unspecified: Secondary | ICD-10-CM

## 2011-05-24 DIAGNOSIS — I3139 Other pericardial effusion (noninflammatory): Secondary | ICD-10-CM

## 2011-05-24 DIAGNOSIS — I4892 Unspecified atrial flutter: Secondary | ICD-10-CM

## 2011-05-24 DIAGNOSIS — I10 Essential (primary) hypertension: Secondary | ICD-10-CM

## 2011-05-24 DIAGNOSIS — N183 Chronic kidney disease, stage 3 unspecified: Secondary | ICD-10-CM

## 2011-05-24 DIAGNOSIS — M545 Low back pain, unspecified: Secondary | ICD-10-CM

## 2011-05-24 DIAGNOSIS — R0602 Shortness of breath: Secondary | ICD-10-CM

## 2011-05-24 DIAGNOSIS — R931 Abnormal findings on diagnostic imaging of heart and coronary circulation: Secondary | ICD-10-CM

## 2011-05-24 DIAGNOSIS — Z95810 Presence of automatic (implantable) cardiac defibrillator: Secondary | ICD-10-CM

## 2011-05-24 DIAGNOSIS — I509 Heart failure, unspecified: Secondary | ICD-10-CM

## 2011-05-24 DIAGNOSIS — N400 Enlarged prostate without lower urinary tract symptoms: Secondary | ICD-10-CM

## 2011-05-24 DIAGNOSIS — R000 Tachycardia, unspecified: Secondary | ICD-10-CM

## 2011-05-24 DIAGNOSIS — I472 Ventricular tachycardia, unspecified: Secondary | ICD-10-CM

## 2011-05-24 DIAGNOSIS — E785 Hyperlipidemia, unspecified: Secondary | ICD-10-CM | POA: Diagnosis present

## 2011-05-24 DIAGNOSIS — R5381 Other malaise: Secondary | ICD-10-CM

## 2011-05-24 DIAGNOSIS — G609 Hereditary and idiopathic neuropathy, unspecified: Secondary | ICD-10-CM

## 2011-05-24 DIAGNOSIS — R9430 Abnormal result of cardiovascular function study, unspecified: Secondary | ICD-10-CM

## 2011-05-24 DIAGNOSIS — M4800 Spinal stenosis, site unspecified: Secondary | ICD-10-CM

## 2011-05-24 DIAGNOSIS — M109 Gout, unspecified: Secondary | ICD-10-CM | POA: Insufficient documentation

## 2011-05-24 DIAGNOSIS — I358 Other nonrheumatic aortic valve disorders: Secondary | ICD-10-CM

## 2011-05-24 DIAGNOSIS — Z86718 Personal history of other venous thrombosis and embolism: Secondary | ICD-10-CM

## 2011-05-24 DIAGNOSIS — Z889 Allergy status to unspecified drugs, medicaments and biological substances status: Secondary | ICD-10-CM | POA: Insufficient documentation

## 2011-05-24 DIAGNOSIS — E1151 Type 2 diabetes mellitus with diabetic peripheral angiopathy without gangrene: Secondary | ICD-10-CM | POA: Diagnosis present

## 2011-05-24 DIAGNOSIS — N179 Acute kidney failure, unspecified: Secondary | ICD-10-CM | POA: Diagnosis present

## 2011-05-24 DIAGNOSIS — J90 Pleural effusion, not elsewhere classified: Secondary | ICD-10-CM

## 2011-05-24 DIAGNOSIS — I5023 Acute on chronic systolic (congestive) heart failure: Principal | ICD-10-CM

## 2011-05-24 HISTORY — DX: Pleural plaque without asbestos: J92.9

## 2011-05-24 HISTORY — DX: Other long term (current) drug therapy: Z79.899

## 2011-05-24 LAB — DIFFERENTIAL
Basophils Absolute: 0 10*3/uL (ref 0.0–0.1)
Basophils Absolute: 0 10*3/uL (ref 0.0–0.1)
Basophils Relative: 0 % (ref 0–1)
Basophils Relative: 0 % (ref 0–1)
Eosinophils Absolute: 0.1 10*3/uL (ref 0.0–0.7)
Lymphocytes Relative: 13 % (ref 12–46)
Lymphocytes Relative: 15 % (ref 12–46)
Lymphs Abs: 1 10*3/uL (ref 0.7–4.0)
Monocytes Relative: 8 % (ref 3–12)
Neutro Abs: 5.8 10*3/uL (ref 1.7–7.7)
Neutro Abs: 5.8 10*3/uL (ref 1.7–7.7)
Neutrophils Relative %: 74 % (ref 43–77)

## 2011-05-24 LAB — CBC
HCT: 49.5 % (ref 39.0–52.0)
Hemoglobin: 17.3 g/dL — ABNORMAL HIGH (ref 13.0–17.0)
MCHC: 34.9 g/dL (ref 30.0–36.0)
MCHC: 35.6 g/dL (ref 30.0–36.0)
MCV: 89 fL (ref 78.0–100.0)
Platelets: 165 10*3/uL (ref 150–400)
RDW: 14.9 % (ref 11.5–15.5)
RDW: 14.6 % (ref 11.5–15.5)
WBC: 7.9 10*3/uL (ref 4.0–10.5)

## 2011-05-24 LAB — BASIC METABOLIC PANEL
BUN: 44 mg/dL — ABNORMAL HIGH (ref 6–23)
CO2: 28 mEq/L (ref 19–32)
Chloride: 94 mEq/L — ABNORMAL LOW (ref 96–112)
Creatinine, Ser: 2 mg/dL — ABNORMAL HIGH (ref 0.50–1.35)
GFR calc Af Amer: 37 mL/min — ABNORMAL LOW (ref >90)
Potassium: 3.9 mEq/L (ref 3.5–5.1)

## 2011-05-24 LAB — PROTIME-INR
INR: 2.09 — ABNORMAL HIGH (ref 0.00–1.49)
INR: 2.21 — ABNORMAL HIGH (ref 0.00–1.49)
Prothrombin Time: 23.8 seconds — ABNORMAL HIGH (ref 11.6–15.2)
Prothrombin Time: 24.9 seconds — ABNORMAL HIGH (ref 11.6–15.2)

## 2011-05-24 LAB — COMPREHENSIVE METABOLIC PANEL
AST: 13 U/L (ref 0–37)
BUN: 49 mg/dL — ABNORMAL HIGH (ref 6–23)
CO2: 27 mEq/L (ref 19–32)
Calcium: 9.5 mg/dL (ref 8.4–10.5)
Chloride: 95 mEq/L — ABNORMAL LOW (ref 96–112)
Creatinine, Ser: 2.12 mg/dL — ABNORMAL HIGH (ref 0.50–1.35)
GFR calc non Af Amer: 30 mL/min — ABNORMAL LOW (ref >90)
Total Bilirubin: 0.6 mg/dL (ref 0.3–1.2)

## 2011-05-24 LAB — URINALYSIS, ROUTINE W REFLEX MICROSCOPIC
Bilirubin Urine: NEGATIVE
Glucose, UA: NEGATIVE mg/dL
Hgb urine dipstick: NEGATIVE
Specific Gravity, Urine: 1.016 (ref 1.005–1.030)

## 2011-05-24 LAB — GLUCOSE, CAPILLARY: Glucose-Capillary: 138 mg/dL — ABNORMAL HIGH (ref 70–99)

## 2011-05-24 LAB — PROCALCITONIN: Procalcitonin: 0.1 ng/mL

## 2011-05-24 LAB — LACTIC ACID, PLASMA: Lactic Acid, Venous: 3.2 mmol/L — ABNORMAL HIGH (ref 0.5–2.2)

## 2011-05-24 LAB — CARDIAC PANEL(CRET KIN+CKTOT+MB+TROPI): CK, MB: 1.4 ng/mL (ref 0.3–4.0)

## 2011-05-24 LAB — PHOSPHORUS: Phosphorus: 5.3 mg/dL — ABNORMAL HIGH (ref 2.3–4.6)

## 2011-05-24 LAB — MAGNESIUM: Magnesium: 2.4 mg/dL (ref 1.5–2.5)

## 2011-05-24 LAB — APTT: aPTT: 32 seconds (ref 24–37)

## 2011-05-24 MED ORDER — SODIUM CHLORIDE 0.9 % IJ SOLN
3.0000 mL | Freq: Two times a day (BID) | INTRAMUSCULAR | Status: DC
  Administered 2011-05-25 – 2011-05-27 (×3): 3 mL via INTRAVENOUS

## 2011-05-24 MED ORDER — IPRATROPIUM BROMIDE 0.06 % NA SOLN
1.0000 | Freq: Every day | NASAL | Status: DC
Start: 2011-05-25 — End: 2011-05-27
  Administered 2011-05-25 – 2011-05-27 (×2): 1 via NASAL
  Filled 2011-05-24: qty 15

## 2011-05-24 MED ORDER — SODIUM CHLORIDE 0.9 % IV BOLUS (SEPSIS)
1000.0000 mL | Freq: Once | INTRAVENOUS | Status: AC
  Administered 2011-05-24: 1000 mL via INTRAVENOUS

## 2011-05-24 MED ORDER — FUROSEMIDE 40 MG PO TABS
40.0000 mg | ORAL_TABLET | Freq: Two times a day (BID) | ORAL | Status: DC
  Administered 2011-05-24 – 2011-05-26 (×3): 40 mg via ORAL
  Filled 2011-05-24 (×6): qty 1

## 2011-05-24 MED ORDER — COLCHICINE 0.6 MG PO TABS
0.6000 mg | ORAL_TABLET | Freq: Every day | ORAL | Status: DC
  Administered 2011-05-24 – 2011-05-27 (×3): 0.6 mg via ORAL
  Filled 2011-05-24 (×4): qty 1

## 2011-05-24 MED ORDER — METHOCARBAMOL 500 MG PO TABS
500.0000 mg | ORAL_TABLET | Freq: Two times a day (BID) | ORAL | Status: DC
  Administered 2011-05-24 – 2011-05-27 (×6): 500 mg via ORAL
  Filled 2011-05-24 (×7): qty 1

## 2011-05-24 MED ORDER — OXYCODONE-ACETAMINOPHEN 7.5-325 MG PO TABS: 1.0000 | ORAL_TABLET | Freq: Three times a day (TID) | ORAL | Status: DC | PRN

## 2011-05-24 MED ORDER — WARFARIN SODIUM 7.5 MG PO TABS
7.5000 mg | ORAL_TABLET | Freq: Once | ORAL | Status: AC
  Administered 2011-05-24: 7.5 mg via ORAL
  Filled 2011-05-24: qty 1

## 2011-05-24 MED ORDER — LOSARTAN POTASSIUM 50 MG PO TABS
50.0000 mg | ORAL_TABLET | Freq: Two times a day (BID) | ORAL | Status: DC
  Administered 2011-05-24: 50 mg via ORAL
  Filled 2011-05-24 (×3): qty 1

## 2011-05-24 MED ORDER — PIPERACILLIN-TAZOBACTAM 3.375 G IVPB
3.3750 g | Freq: Three times a day (TID) | INTRAVENOUS | Status: DC
  Administered 2011-05-24 – 2011-05-27 (×7): 3.375 g via INTRAVENOUS
  Filled 2011-05-24 (×12): qty 50

## 2011-05-24 MED ORDER — INDOMETHACIN 25 MG PO CAPS
25.0000 mg | ORAL_CAPSULE | Freq: Three times a day (TID) | ORAL | Status: DC | PRN
  Filled 2011-05-24: qty 1

## 2011-05-24 MED ORDER — NITROGLYCERIN 0.4 MG SL SUBL: 0.4000 mg | SUBLINGUAL_TABLET | SUBLINGUAL | Status: DC | PRN

## 2011-05-24 MED ORDER — SODIUM CHLORIDE 0.9 % IV BOLUS (SEPSIS): 1000.0000 mL | Freq: Once | INTRAVENOUS | Status: DC

## 2011-05-24 MED ORDER — OXYCODONE HCL 5 MG PO TABS: 2.5000 mg | ORAL_TABLET | Freq: Three times a day (TID) | ORAL | Status: DC | PRN

## 2011-05-24 MED ORDER — VANCOMYCIN HCL 1000 MG IV SOLR
1500.0000 mg | INTRAVENOUS | Status: DC
  Administered 2011-05-24 – 2011-05-26 (×3): 1500 mg via INTRAVENOUS
  Filled 2011-05-24 (×5): qty 1500

## 2011-05-24 MED ORDER — VITAMIN B-1 100 MG PO TABS
100.0000 mg | ORAL_TABLET | Freq: Every day | ORAL | Status: DC
  Administered 2011-05-25 – 2011-05-27 (×3): 100 mg via ORAL
  Filled 2011-05-24 (×3): qty 1

## 2011-05-24 MED ORDER — AMLODIPINE BESYLATE 5 MG PO TABS
5.0000 mg | ORAL_TABLET | Freq: Every day | ORAL | Status: DC
  Filled 2011-05-24: qty 1

## 2011-05-24 MED ORDER — PNEUMOCOCCAL VAC POLYVALENT 25 MCG/0.5ML IJ INJ
0.5000 mL | INJECTION | INTRAMUSCULAR | Status: AC
  Administered 2011-05-25: 0.5 mL via INTRAMUSCULAR
  Filled 2011-05-24: qty 0.5

## 2011-05-24 MED ORDER — ASPIRIN EC 81 MG PO TBEC
81.0000 mg | DELAYED_RELEASE_TABLET | ORAL | Status: DC
  Administered 2011-05-25: 81 mg via ORAL
  Filled 2011-05-24: qty 1

## 2011-05-24 MED ORDER — POTASSIUM CHLORIDE 20 MEQ PO PACK
10.0000 meq | PACK | Freq: Two times a day (BID) | ORAL | Status: DC
  Administered 2011-05-24: 10 meq via ORAL
  Filled 2011-05-24 (×4): qty 1

## 2011-05-24 MED ORDER — ASPIRIN 81 MG PO TABS: 81.0000 mg | ORAL_TABLET | ORAL | Status: DC

## 2011-05-24 MED ORDER — VITAMIN D3 25 MCG (1000 UNIT) PO TABS
1000.0000 [IU] | ORAL_TABLET | Freq: Every day | ORAL | Status: DC
  Administered 2011-05-24 – 2011-05-27 (×4): 1000 [IU] via ORAL
  Filled 2011-05-24 (×4): qty 1

## 2011-05-24 MED ORDER — CARVEDILOL 25 MG PO TABS
25.0000 mg | ORAL_TABLET | Freq: Two times a day (BID) | ORAL | Status: DC
  Administered 2011-05-25: 25 mg via ORAL
  Filled 2011-05-24 (×3): qty 1

## 2011-05-24 MED ORDER — WARFARIN - PHARMACIST DOSING INPATIENT
Freq: Every day | Status: DC
  Filled 2011-05-24 (×4): qty 1

## 2011-05-24 MED ORDER — IPRATROPIUM BROMIDE 0.02 % IN SOLN: 0.5000 mg | Freq: Four times a day (QID) | RESPIRATORY_TRACT | Status: DC | PRN

## 2011-05-24 MED ORDER — SIMVASTATIN 40 MG PO TABS
40.0000 mg | ORAL_TABLET | Freq: Every day | ORAL | Status: DC
  Administered 2011-05-24: 40 mg via ORAL
  Filled 2011-05-24 (×2): qty 1

## 2011-05-24 MED ORDER — DM-GUAIFENESIN ER 30-600 MG PO TB12
1.0000 | ORAL_TABLET | Freq: Two times a day (BID) | ORAL | Status: DC
  Administered 2011-05-24 – 2011-05-27 (×6): 1 via ORAL
  Filled 2011-05-24 (×7): qty 1

## 2011-05-24 MED ORDER — MOXIFLOXACIN HCL IN NACL 400 MG/250ML IV SOLN
400.0000 mg | Freq: Once | INTRAVENOUS | Status: AC
  Administered 2011-05-24: 400 mg via INTRAVENOUS
  Filled 2011-05-24: qty 250

## 2011-05-24 MED ORDER — GLIMEPIRIDE 1 MG PO TABS
1.0000 mg | ORAL_TABLET | Freq: Every day | ORAL | Status: DC
  Administered 2011-05-25 – 2011-05-27 (×3): 1 mg via ORAL
  Filled 2011-05-24 (×4): qty 1

## 2011-05-24 MED ORDER — GABAPENTIN 100 MG PO CAPS
100.0000 mg | ORAL_CAPSULE | Freq: Two times a day (BID) | ORAL | Status: DC
  Administered 2011-05-24 – 2011-05-27 (×6): 100 mg via ORAL
  Filled 2011-05-24 (×7): qty 1

## 2011-05-24 MED ORDER — OXYCODONE-ACETAMINOPHEN 5-325 MG PO TABS: 1.0000 | ORAL_TABLET | Freq: Three times a day (TID) | ORAL | Status: DC | PRN

## 2011-05-24 MED ORDER — ALBUTEROL SULFATE (5 MG/ML) 0.5% IN NEBU: 2.5000 mg | INHALATION_SOLUTION | Freq: Four times a day (QID) | RESPIRATORY_TRACT | Status: DC | PRN

## 2011-05-24 MED ORDER — GABAPENTIN 100 MG PO CAPS
100.0000 mg | ORAL_CAPSULE | Freq: Three times a day (TID) | ORAL | Status: DC
  Filled 2011-05-24 (×4): qty 1

## 2011-05-24 NOTE — Telephone Encounter (Signed)
Spoke with patient's wife, patient refusing to be seen at ER. Patient would like an appointment here, patient's wife informed Dr.Hopper is out of office and the other doctor's schedules are full. Patient offered to be seen at [facility], patient ok'd.   Patient's wife states patient with cough-productive, fatigue, and SOB

## 2011-05-24 NOTE — H&P (Addendum)
PCP:  [name], MD, MD   DOA:  05/24/2011 12:14 PM  Chief Complaint:  Shortness of breath  HPI: Patient is a pleasant 71 year old gentleman with extensive medical history including hypertension, atrial fibrillation on Coumadin, right lower extremity DVT, diabetes, chronic systolic congestive heart failure (ejection fraction 35% in 2011), dyslipidemia, chronic kidney disease stage III who presented to [facility] with complaints of cough, congestion, generalized weakness for at least 4 days prior to the admission. The patient reports having a feeling of a wet cough however he is unable to mobilize the secretions. Patient also reports worsening shortness of breath over the last few days prior to the admission. There are no complaints of chest pain, fever, chills. No complaints of abdominal pain, nausea, vomiting. No complaints of area or constipation. No complaints of blood in the stool or urine. No complaints of dysuria. No complaints of lightheadedness, dizziness or loss of consciousness.   Assessment/Plan  Principal Problem:   *Shortness of breath - Likely secondary to acute decompensate systolic CHF masturbation versus community-acquired pneumonia versus sepsis - Patient denies history of COPD - Based on chest x-ray findings there is a stable cardiomegaly - Continue home medication regimen with Lasix 40 mg twice daily, Corag 25 mg twice a day - Nebulizer treatments for shortness of breath - Obtain pro BNP and 2-D echo - Cycle cardiac enzymes, flu test - start mucinex BID - Based on 2-D echo in 2011 ejection fraction is 35% - Followup procalcitonin level and lactic acid  Active Problems:   DIABETES MELLITUS, CONTROLLED - Obtained A1c level - Sliding-scale insulin - CBG monitoring   Atrial fibrillation - Rate controlled - Coumadin as per pharmacy protocol - INR therapeutic on admission   Acute on chronic systolic heart failure - Obtain 2-D echo - Ejection  fraction in 2011 35% - Blood pressure at goal at present. IV fluids and continue Lasix 40 mg twice a day - Daily weight - Strict intake and output - Keep magnesium above 2 and potassium above 4  CKD (chronic kidney disease), stage III - Kidney function appears to be at baseline - Continue to monitor throughout the hospital stay   Community acquired pneumonia - Start Vanco and Zosyn empirically - Followup blood cultures - Nebulizer treatments as needed for shortness of breath   Hyperlipidemia - Continue simvastatin   Hypertension - Blood pressure is at goal at present, patient was hypotensive while in the emergency room and [facility] so for now we'll hold off on antihypertensives until we see the blood pressure trend   Disposition - Admit to telemetry   Education - Patient and family are aware of the plan of care and treatment - Contact information was provided for future concerns or questions    Allergies: Allergies  Allergen Reactions  . Avelox (Moxifloxacin Hcl In Nacl) Swelling, Rash and Other (See Comments)    Patient became hypotensive after infusion started  . Penicillins     REACTION: anaphylaxis    Prior to Admission medications   Medication Sig Start Date End Date Taking? Authorizing Provider  amLODipine (NORVASC) 5 MG tablet Take 1 tablet (5 mg total) by mouth daily. 02/02/11  Yes Marca Ancona, MD  aspirin 81 MG tablet Take 81 mg by mouth once a week.    Yes Historical Provider, MD  carvedilol (COREG) 25 MG tablet Take 25 mg by mouth 2 (two) times daily with a meal.    Yes Historical Provider, MD  celecoxib (  CELEBREX) 200 MG capsule Take 200 mg by mouth daily.   Yes Historical Provider, MD  cholecalciferol (VITAMIN D) 1000 UNITS tablet Take 1,000 Units by mouth daily.   Yes Historical Provider, MD  furosemide (LASIX) 40 MG tablet Take 40 mg by mouth 2 (two) times daily. May take extra tablet if needed fluid 10/13/10  Yes [name], MD    gabapentin (NEURONTIN) 100 MG capsule Take 100 mg by mouth 2 (two) times daily. 03/10/11  Yes [name], MD  glimepiride (AMARYL) 2 MG tablet Take 1 mg by mouth daily before breakfast. 1/2 tab daily 09/27/10  Yes [name], MD  ipratropium (ATROVENT) 0.06 % nasal spray Place 1 spray into the nose daily.  01/24/11  Yes Historical Provider, MD  losartan (COZAAR) 50 MG tablet Take 1 tablet (50 mg total) by mouth 2 (two) times daily. 03/10/11  Yes [name], MD  metFORMIN (GLUCOPHAGE) 500 MG tablet Take 500 mg by mouth daily.     Yes Historical Provider, MD  methocarbamol (ROBAXIN) 500 MG tablet Take 500 mg by mouth 2 (two) times daily.    Yes Historical Provider, MD  oxyCODONE-acetaminophen (PERCOCET) 7.5-325 MG per tablet Take 1 tablet by mouth every 8 (eight) hours as needed. For pain   Yes Historical Provider, MD  potassium chloride (KLOR-CON) 20 MEQ packet Take 10 mEq by mouth 2 (two) times daily. 1/2 by mouth twice daily   Yes Historical Provider, MD  simvastatin (ZOCOR) 40 MG tablet Take 1 tablet (40 mg total) by mouth at bedtime. 09/27/10  Yes [name], MD  thiamine (VITAMIN B-1) 100 MG tablet Take 100 mg by mouth daily.   Yes Historical Provider, MD  warfarin (COUMADIN) 2.5 MG tablet Take 2.5 mg by mouth daily. 2 1/2 tablet daily then on wed and sat 5mg    Yes Historical Provider, MD  ACCU-CHEK SOFTCLIX LANCETS lancets USE AS DIRECTED 06/22/10   [name], MD  colchicine 0.6 MG tablet Take 0.6 mg by mouth 2 (two) times daily as needed. For gout 04/04/11 04/03/12  [name], MD  glucose blood (ACCU-CHEK COMFORT CURVE) test strip Use as instructed 12/09/10   [name], MD  indomethacin (INDOCIN) 25 MG capsule Take 25 mg by mouth as needed. For gout    Historical Provider, MD  nitroGLYCERIN (NITROSTAT) 0.4 MG SL tablet Place 0.4 mg under the tongue every 5 (five) minutes as needed.      Historical Provider, MD  warfarin (COUMADIN) 2.5 MG tablet  11/03/10    [name], MD    Past Medical History  Diagnosis Date  . Diabetes mellitus   . Hyperlipidemia   . Hypertension   . Pilonidal cyst   . Atrial fibrillation     Previous long-term amiodarone therapy with multiple cardioversions / amiodarone stopped September, 2009  . Atrial flutter     Started November, 2010, Left-sided and cannot ablate  . Left atrial thrombus     Remote past... cardioversions done since that time  . Wide-complex tachycardia   . Left ventricular ejection fraction less than 40%   . Gout   . AAA (abdominal aortic aneurysm)     Surgical repair  . Discolored skin   . S/P ICD (internal cardiac defibrillator) procedure     Dr. Ladona Ridgel 2009... by the pacing  . SOB (shortness of breath)     Large left effusion/ thoracentesis/hospitalization/November, 2011... exudated.. cytology negative.. Dr.Wert.. no proof of mesothelioma  .  Pericardial effusion   . Pleural effusion   . S/P AAA repair   . Spinal stenosis     Surgery Dr.Elsner  . CAD (coronary artery disease)     Catheterization July, 2008... name and vein grafts patent but low cardiac output  . Warfarin anticoagulation   . Cardiomyopathy     Ischemic... ICD  . CHF (congestive heart failure)     EF 30-40%... echo.. November, 2008  /  EF 55-60% echo... November, 2011  . Venous insufficiency     Toe discoloration chronic  . Mitral regurgitation     Mild echo  . Aortic valve sclerosis   . Nasal drainage     Chronic  . Alcohol ingestion of more than four drinks per week     Excess beer  not a dependency problem  . Chronotropic incompetence     IV pacing rate adjusted  . Thrombophlebitis of superficial veins of upper extremities     Possible venous stenosis from defibrillator  . Pericardial effusion     November, 2011 .. decreased during hospitalization  . Eye abnormality     Ophthalmologist questions a clot in one of his eyes, May, 2012  . Overweight     November, 2012    Past Surgical History   Procedure Date  . Colonoscopy w/ polypectomy   . Abdominal aortic aneurysm repair   . Coronary artery bypass graft   . Icd      insertion  . Lumbar fusion   . Pilonidal cyst removal   . Surgery scrotal / testicular     Social History:  reports that he quit smoking about 21 years ago. He has quit using smokeless tobacco. He reports that he drinks alcohol. He reports that he does not use illicit drugs.  Family History  Problem Relation Age of Onset  . Hypertension Mother   . Stroke Mother   . Diabetes Father   . Coronary artery disease Father     Review of Systems:  Constitutional: Denies fever, chills, diaphoresis, appetite change and fatigue; positive for weakness.  HEENT: Denies photophobia, eye pain, redness, hearing loss, ear pain, congestion, sore throat, rhinorrhea, sneezing, mouth sores, trouble swallowing, neck pain, neck stiffness and tinnitus.   Respiratory: Positive for shortness of breath and congestion   Cardiovascular: Positive for chest tightness Gastrointestinal: Denies nausea, vomiting, abdominal pain, diarrhea, constipation, blood in stool and abdominal distention.  Genitourinary: Denies dysuria, urgency, frequency, hematuria, flank pain and difficulty urinating.  Musculoskeletal: Denies myalgias, back pain, joint swelling, arthralgias and gait problem.  Skin: Denies pallor, rash and wound.  Neurological: Denies dizziness, seizures, syncope, weakness, light-headedness, numbness and headaches.  Hematological: Denies adenopathy. Easy bruising, personal or family bleeding history  Psychiatric/Behavioral: Denies suicidal ideation, mood changes, confusion, nervousness, sleep disturbance and agitation   Physical Exam:  Filed Vitals:   05/24/11 1735 05/24/11 1810 05/24/11 1815 05/24/11 1830  BP: 84/55 89/74 113/81 112/84  Pulse: 71 66 73 71  Temp:      TempSrc:      Resp: 18 18 17 16   Height:      Weight:      SpO2:        Constitutional: Vital signs  reviewed.   Alert and oriented x3.  Head: Normocephalic and atraumatic Ear: TM normal bilaterally Mouth: no erythema or exudates, MMM Eyes: PERRL, EOMI, conjunctivae normal, No scleral icterus.  Neck: Supple, Trachea midline normal ROM, No JVD, mass, thyromegaly, or carotid bruit present.  Cardiovascular: Irregular rhythm,  rate controlled, S1 normal, S2 normal Pulmonary/Chest: Bilateral wheezing over upper and mid lung lobes Abdominal: Soft. Non-tender, non-distended, bowel sounds are normal, no masses, organomegaly, or guarding present.  GU: no CVA tenderness Musculoskeletal: No joint deformities, erythema, or stiffness, ROM full and no nontender Ext: no edema and no cyanosis, pulses palpable bilaterally (DP and PT) Hematology: no cervical, inginal, or axillary adenopathy.  Neurological: A&O x3, Strenght is normal and symmetric bilaterally, cranial nerve II-XII are grossly intact, no focal motor deficit, sensory intact to light touch bilaterally.  Skin: Warm, dry and intact. No rash, cyanosis, or clubbing.  Psychiatric: Normal mood and affect. speech and behavior is normal. Judgment and thought content normal. Cognition and memory are normal.   Labs on Admission:  Results for orders placed during the hospital encounter of 05/24/11 (from the past 48 hour(s))  CARDIAC PANEL(CRET KIN+CKTOT+MB+TROPI)     Status: Normal   Collection Time   05/24/11 12:30 PM      Component Value Range Comment   Total CK 38  7 - 232 (U/L)    CK, MB 1.7  0.3 - 4.0 (ng/mL)    Troponin I <0.30  <0.30 (ng/mL)    Relative Index RELATIVE INDEX IS INVALID  0.0 - 2.5    BASIC METABOLIC PANEL     Status: Abnormal   Collection Time   05/24/11 12:30 PM      Component Value Range Comment   Sodium 137  135 - 145 (mEq/L)    Potassium 3.9  3.5 - 5.1 (mEq/L)    Chloride 94 (*) 96 - 112 (mEq/L)    CO2 28  19 - 32 (mEq/L)    Glucose, Bld 146 (*) 70 - 99 (mg/dL)    BUN 44 (*) 6 - 23 (mg/dL)    Creatinine, Ser 0.45 (*) 0.50 -  1.35 (mg/dL)    Calcium 9.6  8.4 - 10.5 (mg/dL)    GFR calc non Af Amer 32 (*) >90 (mL/min)    GFR calc Af Amer 37 (*) >90 (mL/min)   CBC     Status: Abnormal   Collection Time   05/24/11 12:30 PM      Component Value Range Comment   WBC 7.5  4.0 - 10.5 (K/uL)    RBC 5.56  4.22 - 5.81 (MIL/uL)    Hemoglobin 17.3 (*) 13.0 - 17.0 (g/dL)    HCT 40.9  81.1 - 91.4 (%)    MCV 89.0  78.0 - 100.0 (fL)    MCH 31.1  26.0 - 34.0 (pg)    MCHC 34.9  30.0 - 36.0 (g/dL)    RDW 78.2  95.6 - 21.3 (%)    Platelets 156  150 - 400 (K/uL)   LACTIC ACID, PLASMA     Status: Abnormal   Collection Time   05/24/11 12:33 PM      Component Value Range Comment   Lactic Acid, Venous 3.2 (*) 0.5 - 2.2 (mmol/L)   PROTIME-INR     Status: Abnormal   Collection Time   05/24/11 12:54 PM      Component Value Range Comment   Prothrombin Time 23.8 (*) 11.6 - 15.2 (seconds)    INR 2.09 (*) 0.00 - 1.49    GLUCOSE, CAPILLARY     Status: Abnormal   Collection Time   05/24/11  5:30 PM      Component Value Range Comment   Glucose-Capillary 138 (*) 70 - 99 (mg/dL)    Comment 1 Notify RN  Comment 2 Documented in Chart       Time Spent on Admission: Greater than 30 minutes  Vannesa Abair 05/24/2011, 7:19 PM

## 2011-05-24 NOTE — Telephone Encounter (Signed)
Pt's wife states that pt is still sick.  Productive cough, weakness, weight loss.  Denies chest pain, n/v/fever.  Advised pt to call PCP, Dr. Alwyn Ren for appointment.  Pt agreed.

## 2011-05-24 NOTE — Telephone Encounter (Signed)
Fu call °Pt's wife calling back again °

## 2011-05-24 NOTE — ED Notes (Signed)
Report given to emily rn on 3300

## 2011-05-24 NOTE — Telephone Encounter (Signed)
[facility] Triage Call Report Triage Record Num: [id number] Operator: Durward Mallard DiMatteis Patient Name: [name] Call Date & Time: 05/24/2011 9:52:59AM Patient Phone: [phone] PCP: [name] Patient Gender: Male PCP Fax : [phone] Patient DOB: Aug 06, 1940 Practice Name: [facility] Reason for Call: Caller: Nile/Mother; PCP: [name]; CB#: [phone]; Call regarding Cough and Weakness; Continuation of call from earlier today: pt is wheezing a little and feels worn out trying to breath; wife states that he has a hx of cardiac and resp problems; she refused wanting to go into his hx at this time; Triaged per Breathing Problems Guideline; Call 911 d/t worsening breathing problems and known cardiac and resp condition and not responding to tx available; wife states that she doesn't want to go to ER; she prefers he is seen in the office; office called and made aware and instructed there are no available appt for today; pt conferenced on the phone with office staff; instructed wife that pt needs to go to UC or ER; appt made for 11:15 today with Efraim Kaufmann, PA per office staff Protocol(s) Used: Breathing Problems Recommended Outcome per Protocol: Activate EMS 911 Reason for Outcome: Worsening breathing problems AND known cardiac or respiratory condition (coronary artery disease, angina, heart failure, asthma, chronic bronchitis, COPD) NOT responding to treatment OR treatment not available. Care Advice: ~ IMMEDIATE ACTION

## 2011-05-24 NOTE — Progress Notes (Signed)
Subjective:    Patient ID: [name], male    DOB: 23-Nov-1940, 71 y.o.   MRN: [id number]  HPI  Mr.  Raudenbush is a 71 yr old male who presents today with chief complaint of cough.  He presents today with his wife.  Cough started 6 days ago and is productive of yellow/green sputum.  He is tolerating PO's, but has lost 15 pounds in the last week.  No nausea/vomitting or fever.  Symptoms are worsening.  Weaker today then yesterday.      Review of Systems See HPI  Past Medical History  Diagnosis Date  . Diabetes mellitus   . Hyperlipidemia   . Hypertension   . Pilonidal cyst   . Atrial fibrillation     Previous long-term amiodarone therapy with multiple cardioversions / amiodarone stopped September, 2009  . Atrial flutter     Started November, 2010, Left-sided and cannot ablate  . Left atrial thrombus     Remote past... cardioversions done since that time  . COPD (chronic obstructive pulmonary disease)     PFTs.. relatively good function 2010  . Wide-complex tachycardia   . Left ventricular ejection fraction less than 40%   . Gout   . AAA (abdominal aortic aneurysm)     Surgical repair  . Discolored skin   . S/P ICD (internal cardiac defibrillator) procedure     Dr. Ladona Ridgel 2009... by the pacing  . SOB (shortness of breath)     Large left effusion/ thoracentesis/hospitalization/November, 2011... exudated.. cytology negative.. Dr.Wert.. no proof of mesothelioma  . Pericardial effusion   . Pleural effusion   . Renal insufficiency   . S/P AAA repair   . Spinal stenosis     Surgery Dr.Elsner  . CAD (coronary artery disease)     Catheterization July, 2008... name and vein grafts patent but low cardiac output  . Warfarin anticoagulation   . Cardiomyopathy     Ischemic... ICD  . CHF (congestive heart failure)     EF 30-40%... echo.. November, 2008  /  EF 55-60% echo... November, 2011  . Venous insufficiency     Toe discoloration chronic  . Mitral regurgitation    Mild echo  . Aortic valve sclerosis   . Nasal drainage     Chronic  . Alcohol ingestion of more than four drinks per week     Excess beer  not a dependency problem  . Chronotropic incompetence     IV pacing rate adjusted  . Thrombophlebitis of superficial veins of upper extremities     Possible venous stenosis from defibrillator  . Pericardial effusion     November, 2011 .. decreased during hospitalization  . Eye abnormality     Ophthalmologist questions a clot in one of his eyes, May, 2012  . Overweight     November, 2012    History   Social History  . Marital Status: Married    Spouse Name: N/A    Number of Children: N/A  . Years of Education: N/A   Occupational History  . Not on file.   Social History Main Topics  . Smoking status: Former Smoker    Quit date: 03/21/1990  . Smokeless tobacco: Former Neurosurgeon  . Alcohol Use: 0.0 oz/week     beer  . Drug Use: No  . Sexually Active: Not on file   Other Topics Concern  . Not on file   Social History Narrative  . No narrative on file    Past Surgical  History  Procedure Date  . Colonoscopy w/ polypectomy   . Abdominal aortic aneurysm repair   . Coronary artery bypass graft   . Icd      insertion  . Lumbar fusion   . Pilonidal cyst removal   . Surgery scrotal / testicular     Family History  Problem Relation Age of Onset  . Hypertension Mother   . Stroke Mother   . Diabetes Father   . Coronary artery disease Father     Allergies  Allergen Reactions  . Penicillins     REACTION: anaphylaxis    Current Outpatient Prescriptions on File Prior to Visit  Medication Sig Dispense Refill  . ACCU-CHEK SOFTCLIX LANCETS lancets USE AS DIRECTED  100 each  4  . amLODipine (NORVASC) 5 MG tablet Take 1 tablet (5 mg total) by mouth daily.  90 tablet  3  . aspirin 81 MG tablet Take 81 mg by mouth once a week.        . carvedilol (COREG) 25 MG tablet Take 25 mg by mouth 2 (two) times daily with a meal.        .  CELEBREX 200 MG capsule 2 (two) times daily.       . Cholecalciferol (VITAMIN D PO) Take by mouth daily.        . colchicine 0.6 MG tablet Take 0.6 mg by mouth 2 (two) times daily.      . furosemide (LASIX) 40 MG tablet Take 1 tablet (40 mg total) by mouth 2 (two) times daily. May take extra tablet if needed.  180 tablet  3  . gabapentin (NEURONTIN) 100 MG capsule Take 1 capsule (100 mg total) by mouth 3 (three) times daily.  270 capsule  3  . glimepiride (AMARYL) 2 MG tablet Take by mouth daily before breakfast. 1/2 tab daily       . glucose blood (ACCU-CHEK COMFORT CURVE) test strip Use as instructed  100 each  4  . indomethacin (INDOCIN) 25 MG capsule Take 25 mg by mouth as needed.        Marland Kitchen ipratropium (ATROVENT) 0.06 % nasal spray Place 1 spray into the nose daily.       Marland Kitchen losartan (COZAAR) 50 MG tablet Take 1 tablet (50 mg total) by mouth 2 (two) times daily.  180 tablet  3  . metFORMIN (GLUCOPHAGE) 500 MG tablet Take 500 mg by mouth daily.        . methocarbamol (ROBAXIN) 500 MG tablet Take 500 mg by mouth 2 (two) times daily.       . nitroGLYCERIN (NITROSTAT) 0.4 MG SL tablet Place 0.4 mg under the tongue every 5 (five) minutes as needed.        . potassium chloride (KLOR-CON) 20 MEQ packet 1/2 by mouth twice daily      . simvastatin (ZOCOR) 40 MG tablet Take 1 tablet (40 mg total) by mouth at bedtime.  90 tablet  1  . warfarin (COUMADIN) 2.5 MG tablet USE AS DIRECTED BY ANTICOAGULATION CLINIC  130 tablet  1    BP 100/50  Pulse 72  Temp(Src) 97.5 F (36.4 C) (Axillary)  Resp 18  Wt 248 lb (112.492 kg)       Objective:   Physical Exam  Constitutional: He is oriented to person, place, and time. He appears well-developed. No distress.       Pale, ill appearing male- color is very ashen.   Cardiovascular: Normal rate and regular rhythm.  No murmur heard. Pulmonary/Chest:       Coarse cough, LLL crackles, shallow breathing  Abdominal: Soft. He exhibits no distension. There is  no tenderness. There is no rebound and no guarding.  Musculoskeletal:       Fingertips cold, nail beds slightly cyanotic in appearance.    Neurological: He is alert and oriented to person, place, and time.          Assessment & Plan:  25 minutes spent with patient.  >50% of this time was spent counseling pt on acuity of illness and plan of care.

## 2011-05-24 NOTE — Progress Notes (Signed)
05/24/2011 1720 3312- [name] arrived via [facility] from [place] [facility]. Family at bedside. Pt vss but bp soft 85/61, pt is asymptomatic. Oriented to unit and equipment. Call bell with in reach. Paged md of patients arrival. Will continue to monitor.  Celesta Gentile

## 2011-05-24 NOTE — Telephone Encounter (Signed)
[facility] Triage Call Report Triage Record Num: [id number] Operator: Durward Mallard DiMatteis Patient Name: [name] Call Date & Time: 05/24/2011 9:33:48AM Patient Phone: [phone] PCP: [name] Patient Gender: Male PCP Fax : [phone] Patient DOB: November 05, 1940 Practice Name: [facility] Reason for Call: Caller: Ardelle Anton; PCP: [name]; CB#: [phone]; Call regarding Weakness, Severe Cough, Has Lost 15 Lbs in Week THE PATIENT REFUSED 911; Wife states that for the last week he has had a very bad cough and weakness; lost 15 lb in the last week; pt is SOB and very weak; these sx stated 05/21/11; no chest pain; no fever; Triaged per Breathing Problems Guideline; **In the middle of triaging and phone disconnected; unable to complete triage; attempted several times to call back but phone line remaining busy Protocol(s) Used: Office Note Recommended Outcome per Protocol: Information Noted and Sent to Office Reason for Outcome: Caller information to office Care Advice:

## 2011-05-24 NOTE — Assessment & Plan Note (Addendum)
Suspect LLL pneumonia, severe dehydration.  Need to rule out sepsis.  Pt was placed on 3 liters [place] oxygen, with improved color to nail beds.  Unable to obtain oxygen saturation despite warming of hands under warm water.  Pulse is thready, My BP check was 80 systolic- unable to hear diastolic pressure.  Spoke with pt and wife and advised them that he needs to be evaluated immediately in the ED for IV hydration/abx and additional work up.  He will likely then need admission once stabilized.  Report was given to charge nurse Amy in [facility] ED and pt was brought down to ED via wheelchair.

## 2011-05-24 NOTE — Progress Notes (Signed)
ANTIBIOTIC CONSULT NOTE - INITIAL  Pharmacy Consult for vancomycin, zosyn, coumadin Indication: rule out pneumonia, afib  Allergies  Allergen Reactions  . Avelox (Moxifloxacin Hcl In Nacl) Swelling, Rash and Other (See Comments)    Patient became hypotensive after infusion started  . Penicillins     REACTION: anaphylaxis    Patient Measurements: Height: 6' (182.9 cm) Weight: 245 lb 2.4 oz (111.2 kg) IBW/kg (Calculated) : 77.6  Adjusted Body Weight: 89 kg  Vital Signs: Temp: 97.5 F (36.4 C) (03/05 1714) Temp src: Oral (03/05 1714) BP: 112/84 mmHg (03/05 1830) Pulse Rate: 71  (03/05 1830) Intake/Output from previous day:   Intake/Output from this shift:    Labs:  Basename 05/24/11 1230  WBC 7.5  HGB 17.3*  PLT 156  LABCREA --  CREATININE 2.00*   Estimated Creatinine Clearance: 44.2 ml/min (by C-G formula based on Cr of 2). No results found for this basename: VANCOTROUGH:2,VANCOPEAK:2,VANCORANDOM:2,GENTTROUGH:2,GENTPEAK:2,GENTRANDOM:2,TOBRATROUGH:2,TOBRAPEAK:2,TOBRARND:2,AMIKACINPEAK:2,AMIKACINTROU:2,AMIKACIN:2, in the last 72 hours   Microbiology: No results found for this or any previous visit (from the past 720 hour(s)).  Medical History: Past Medical History  Diagnosis Date  . Diabetes mellitus   . Hyperlipidemia   . Hypertension   . Pilonidal cyst   . Atrial fibrillation     Previous long-term amiodarone therapy with multiple cardioversions / amiodarone stopped September, 2009  . Atrial flutter     Started November, 2010, Left-sided and cannot ablate  . Left atrial thrombus     Remote past... cardioversions done since that time  . Wide-complex tachycardia   . Left ventricular ejection fraction less than 40%   . Gout   . AAA (abdominal aortic aneurysm)     Surgical repair  . Discolored skin   . S/P ICD (internal cardiac defibrillator) procedure     Dr. Ladona Ridgel 2009... by the pacing  . SOB (shortness of breath)     Large left effusion/  thoracentesis/hospitalization/November, 2011... exudated.. cytology negative.. Dr.Wert.. no proof of mesothelioma  . Pericardial effusion   . Pleural effusion   . S/P AAA repair   . Spinal stenosis     Surgery Dr.Elsner  . CAD (coronary artery disease)     Catheterization July, 2008... name and vein grafts patent but low cardiac output  . Warfarin anticoagulation   . Cardiomyopathy     Ischemic... ICD  . CHF (congestive heart failure)     EF 30-40%... echo.. November, 2008  /  EF 55-60% echo... November, 2011  . Venous insufficiency     Toe discoloration chronic  . Mitral regurgitation     Mild echo  . Aortic valve sclerosis   . Nasal drainage     Chronic  . Alcohol ingestion of more than four drinks per week     Excess beer  not a dependency problem  . Chronotropic incompetence     IV pacing rate adjusted  . Thrombophlebitis of superficial veins of upper extremities     Possible venous stenosis from defibrillator  . Pericardial effusion     November, 2011 .. decreased during hospitalization  . Eye abnormality     Ophthalmologist questions a clot in one of his eyes, May, 2012  . Overweight     November, 2012    Medications:  Prescriptions prior to admission  Medication Sig Dispense Refill  . amLODipine (NORVASC) 5 MG tablet Take 1 tablet (5 mg total) by mouth daily.  90 tablet  3  . aspirin 81 MG tablet Take 81 mg by mouth once  a week.       . carvedilol (COREG) 25 MG tablet Take 25 mg by mouth 2 (two) times daily with a meal.       . celecoxib (CELEBREX) 200 MG capsule Take 200 mg by mouth daily.      . cholecalciferol (VITAMIN D) 1000 UNITS tablet Take 1,000 Units by mouth daily.      . furosemide (LASIX) 40 MG tablet Take 40 mg by mouth 2 (two) times daily. May take extra tablet if needed fluid      . gabapentin (NEURONTIN) 100 MG capsule Take 100 mg by mouth 2 (two) times daily.      Marland Kitchen glimepiride (AMARYL) 2 MG tablet Take 1 mg by mouth daily before breakfast. 1/2 tab  daily      . ipratropium (ATROVENT) 0.06 % nasal spray Place 1 spray into the nose daily.       Marland Kitchen losartan (COZAAR) 50 MG tablet Take 1 tablet (50 mg total) by mouth 2 (two) times daily.  180 tablet  3  . metFORMIN (GLUCOPHAGE) 500 MG tablet Take 500 mg by mouth daily.        . methocarbamol (ROBAXIN) 500 MG tablet Take 500 mg by mouth 2 (two) times daily.       Marland Kitchen oxyCODONE-acetaminophen (PERCOCET) 7.5-325 MG per tablet Take 1 tablet by mouth every 8 (eight) hours as needed. For pain      . potassium chloride (KLOR-CON) 20 MEQ packet Take 10 mEq by mouth 2 (two) times daily. 1/2 by mouth twice daily      . simvastatin (ZOCOR) 40 MG tablet Take 1 tablet (40 mg total) by mouth at bedtime.  90 tablet  1  . thiamine (VITAMIN B-1) 100 MG tablet Take 100 mg by mouth daily.      Marland Kitchen warfarin (COUMADIN) 2.5 MG tablet Take 2.5 mg by mouth daily. 2 1/2 tablet daily then on wed and sat 5mg       . ACCU-CHEK SOFTCLIX LANCETS lancets USE AS DIRECTED  100 each  4  . colchicine 0.6 MG tablet Take 0.6 mg by mouth 2 (two) times daily as needed. For gout      . glucose blood (ACCU-CHEK COMFORT CURVE) test strip Use as instructed  100 each  4  . indomethacin (INDOCIN) 25 MG capsule Take 25 mg by mouth as needed. For gout      . nitroGLYCERIN (NITROSTAT) 0.4 MG SL tablet Place 0.4 mg under the tongue every 5 (five) minutes as needed.        . warfarin (COUMADIN) 2.5 MG tablet        Assessment: 71 yo man to begin empiric vanc and zosyn for r/o PNA.  Continuing coumadin for afib.  Goal of Therapy:  Vancomycin trough level 15-20 mcg/ml, INR 2-3  Plan:  Vancomycin 1500 mg IV q24 hours.  Zosyn 3.375 gm IV q 8hours.  Coumadin 7.5 mg tonight.  F/u cultures, renal function.  Check vanc trough when appropriate.  F/u daily protimes.  [name] 05/24/2011,7:22 PM

## 2011-05-24 NOTE — ED Notes (Signed)
Patient returned from xray.

## 2011-05-24 NOTE — ED Provider Notes (Addendum)
History     CSN: [id number]  Arrival date & time 05/24/11  1207   First MD Initiated Contact with Patient 05/24/11 1224      Chief Complaint  Patient presents with  . Shortness of Breath   Patient's spouse, states she's been having shortness of breath, cough, and cold symptoms since last Friday. They've been using over-the-counter medications without relief. Over the past 24 hours. Patient has had increasing generalized weakness and malaise. He also has had increasing shortness of breath. He states he is unable to cough up any sputum. He's had no chest pain. Unknown whether he has had any fevers. Initial temperature here is 97.5.  He did see the mid-level provider upstairs, Sandford Craze. He was apparently sent to the ER for further evaluation and imaging and possible admission. Patient states, "I just feel run down". He has had no numbness, tingling, weakness, dizziness or syncope. (Consider location/radiation/quality/duration/timing/severity/associated sxs/prior treatment) HPI  Past Medical History  Diagnosis Date  . Diabetes mellitus   . Hyperlipidemia   . Hypertension   . Pilonidal cyst   . Atrial fibrillation     Previous long-term amiodarone therapy with multiple cardioversions / amiodarone stopped September, 2009  . Atrial flutter     Started November, 2010, Left-sided and cannot ablate  . Left atrial thrombus     Remote past... cardioversions done since that time  . COPD (chronic obstructive pulmonary disease)     PFTs.. relatively good function 2010  . Wide-complex tachycardia   . Left ventricular ejection fraction less than 40%   . Gout   . AAA (abdominal aortic aneurysm)     Surgical repair  . Discolored skin   . S/P ICD (internal cardiac defibrillator) procedure     Dr. Ladona Ridgel 2009... by the pacing  . SOB (shortness of breath)     Large left effusion/ thoracentesis/hospitalization/November, 2011... exudated.. cytology negative.. Dr.Wert.. no proof of  mesothelioma  . Pericardial effusion   . Pleural effusion   . Renal insufficiency   . S/P AAA repair   . Spinal stenosis     Surgery Dr.Elsner  . CAD (coronary artery disease)     Catheterization July, 2008... name and vein grafts patent but low cardiac output  . Warfarin anticoagulation   . Cardiomyopathy     Ischemic... ICD  . CHF (congestive heart failure)     EF 30-40%... echo.. November, 2008  /  EF 55-60% echo... November, 2011  . Venous insufficiency     Toe discoloration chronic  . Mitral regurgitation     Mild echo  . Aortic valve sclerosis   . Nasal drainage     Chronic  . Alcohol ingestion of more than four drinks per week     Excess beer  not a dependency problem  . Chronotropic incompetence     IV pacing rate adjusted  . Thrombophlebitis of superficial veins of upper extremities     Possible venous stenosis from defibrillator  . Pericardial effusion     November, 2011 .. decreased during hospitalization  . Eye abnormality     Ophthalmologist questions a clot in one of his eyes, May, 2012  . Overweight     November, 2012    Past Surgical History  Procedure Date  . Colonoscopy w/ polypectomy   . Abdominal aortic aneurysm repair   . Coronary artery bypass graft   . Icd      insertion  . Lumbar fusion   . Pilonidal cyst removal   .  Surgery scrotal / testicular     Family History  Problem Relation Age of Onset  . Hypertension Mother   . Stroke Mother   . Diabetes Father   . Coronary artery disease Father     History  Substance Use Topics  . Smoking status: Former Smoker    Quit date: 03/21/1990  . Smokeless tobacco: Former Neurosurgeon  . Alcohol Use: 0.0 oz/week     beer      Review of Systems  All other systems reviewed and are negative.    Allergies  Penicillins  Home Medications   Current Outpatient Rx  Name Route Sig Dispense Refill  . ACCU-CHEK SOFTCLIX LANCETS MISC  USE AS DIRECTED 100 each 4  . AMLODIPINE BESYLATE 5 MG PO TABS  Oral Take 1 tablet (5 mg total) by mouth daily. 90 tablet 3  . ASPIRIN 81 MG PO TABS Oral Take 81 mg by mouth once a week.      Marland Kitchen CARVEDILOL 25 MG PO TABS Oral Take 25 mg by mouth 2 (two) times daily with a meal.      . CELEBREX 200 MG PO CAPS  2 (two) times daily.     Marland Kitchen VITAMIN D PO Oral Take by mouth daily.      . COLCHICINE 0.6 MG PO TABS Oral Take 0.6 mg by mouth 2 (two) times daily.    . FUROSEMIDE 40 MG PO TABS Oral Take 1 tablet (40 mg total) by mouth 2 (two) times daily. May take extra tablet if needed. 180 tablet 3  . GABAPENTIN 100 MG PO CAPS Oral Take 1 capsule (100 mg total) by mouth 3 (three) times daily. 270 capsule 3  . GLIMEPIRIDE 2 MG PO TABS Oral Take by mouth daily before breakfast. 1/2 tab daily     . GLUCOSE BLOOD VI STRP  Use as instructed 100 each 4  . INDOMETHACIN 25 MG PO CAPS Oral Take 25 mg by mouth as needed.      . IPRATROPIUM BROMIDE 0.06 % NA SOLN Nasal Place 1 spray into the nose daily.     Marland Kitchen LOSARTAN POTASSIUM 50 MG PO TABS Oral Take 1 tablet (50 mg total) by mouth 2 (two) times daily. 180 tablet 3  . METFORMIN HCL 500 MG PO TABS Oral Take 500 mg by mouth daily.      Marland Kitchen METHOCARBAMOL 500 MG PO TABS Oral Take 500 mg by mouth 2 (two) times daily.     Marland Kitchen NITROGLYCERIN 0.4 MG SL SUBL Sublingual Place 0.4 mg under the tongue every 5 (five) minutes as needed.      Marland Kitchen POTASSIUM CHLORIDE 20 MEQ PO PACK  1/2 by mouth twice daily    . SIMVASTATIN 40 MG PO TABS Oral Take 1 tablet (40 mg total) by mouth at bedtime. 90 tablet 1  . WARFARIN SODIUM 2.5 MG PO TABS  USE AS DIRECTED BY ANTICOAGULATION CLINIC 130 tablet 1    There were no vitals taken for this visit.  Physical Exam  Nursing note and vitals reviewed. Constitutional: He is oriented to person, place, and time. He appears well-developed and well-nourished.  HENT:  Head: Normocephalic and atraumatic.  Eyes: Conjunctivae and EOM are normal. Pupils are equal, round, and reactive to light.  Neck: Neck supple.    Cardiovascular: Normal rate and regular rhythm.  Exam reveals no gallop and no friction rub.   No murmur heard. Pulmonary/Chest: Breath sounds normal. He has no wheezes. He has no rales. He exhibits  no tenderness.       Decreased lung sounds in the bases. Dry cough. No respiratory distress  Abdominal: Soft. Bowel sounds are normal. He exhibits no distension. There is no tenderness. There is no rebound and no guarding.  Musculoskeletal: Normal range of motion. He exhibits no edema.  Neurological: He is alert and oriented to person, place, and time. He has normal reflexes. No cranial nerve deficit. He exhibits normal muscle tone. Coordination normal.  Skin: Skin is warm and dry. No rash noted. No erythema. No pallor.  Psychiatric: He has a normal mood and affect.    ED Course  Procedures (including critical care time)   Labs Reviewed  CARDIAC PANEL(CRET KIN+CKTOT+MB+TROPI)  BASIC METABOLIC PANEL  CBC  DIFFERENTIAL  CULTURE, BLOOD (ROUTINE X 2)  CULTURE, BLOOD (ROUTINE X 2)  URINALYSIS, ROUTINE W REFLEX MICROSCOPIC  LACTIC ACID, PLASMA   No results found.   No diagnosis found.    MDM  Pt is seen and examined;  Initial history and physical completed.  Will follow.   Chart review was done, more so Sullivan's note from earlier today was also reviewed.   Pulse rate is 72, respiratory rate is 18. Blood pressure 100/50. Still checking pulse oximetry, 100% on 2 L.   Further workup initiated. Will give fluids. Check blood cultures lactic acid, chest x-ray, labs, and may require admission.   Date: 05/24/2011  Rate: 80  Rhythm: Paced Rhythm  QRS Axis: left  Intervals: paced,   ST/T Wave abnormalities: nonspecific ST/T changes  Conduction Disutrbances:nonspecific intraventricular conduction delay  Narrative Interpretation:   Old EKG Reviewed: changes noted   Results for orders placed during the hospital encounter of 05/24/11  CARDIAC PANEL(CRET KIN+CKTOT+MB+TROPI)       Component Value Range   Total CK 38  7 - 232 (U/L)   CK, MB 1.7  0.3 - 4.0 (ng/mL)   Troponin I <0.30  <0.30 (ng/mL)   Relative Index RELATIVE INDEX IS INVALID  0.0 - 2.5   BASIC METABOLIC PANEL      Component Value Range   Sodium 137  135 - 145 (mEq/L)   Potassium 3.9  3.5 - 5.1 (mEq/L)   Chloride 94 (*) 96 - 112 (mEq/L)   CO2 28  19 - 32 (mEq/L)   Glucose, Bld 146 (*) 70 - 99 (mg/dL)   BUN 44 (*) 6 - 23 (mg/dL)   Creatinine, Ser 4.09 (*) 0.50 - 1.35 (mg/dL)   Calcium 9.6  8.4 - 81.1 (mg/dL)   GFR calc non Af Amer 32 (*) >90 (mL/min)   GFR calc Af Amer 37 (*) >90 (mL/min)  CBC      Component Value Range   WBC 7.5  4.0 - 10.5 (K/uL)   RBC 5.56  4.22 - 5.81 (MIL/uL)   Hemoglobin 17.3 (*) 13.0 - 17.0 (g/dL)   HCT 91.4  78.2 - 95.6 (%)   MCV 89.0  78.0 - 100.0 (fL)   MCH 31.1  26.0 - 34.0 (pg)   MCHC 34.9  30.0 - 36.0 (g/dL)   RDW 21.3  08.6 - 57.8 (%)   Platelets 156  150 - 400 (K/uL)  DIFFERENTIAL      Component Value Range   Neutrophils Relative 78 (*) 43 - 77 (%)   Lymphocytes Relative 13  12 - 46 (%)   Monocytes Relative 8  3 - 12 (%)   Eosinophils Relative 1  0 - 5 (%)   Basophils Relative 0  0 -  1 (%)   Neutro Abs 5.8  1.7 - 7.7 (K/uL)   Lymphs Abs 1.0  0.7 - 4.0 (K/uL)   Monocytes Absolute 0.6  0.1 - 1.0 (K/uL)   Eosinophils Absolute 0.1  0.0 - 0.7 (K/uL)   Basophils Absolute 0.0  0.0 - 0.1 (K/uL)   WBC Morphology WHITE COUNT CONFIRMED ON SMEAR     Smear Review MORPHOLOGY UNREMARKABLE    LACTIC ACID, PLASMA      Component Value Range   Lactic Acid, Venous 3.2 (*) 0.5 - 2.2 (mmol/L)  PROTIME-INR      Component Value Range   Prothrombin Time 23.8 (*) 11.6 - 15.2 (seconds)   INR 2.09 (*) 0.00 - 1.49    Dg Chest 2 View  05/24/2011  *RADIOLOGY REPORT*  Clinical Data: 71 year old male with chest pain, cough, shortness of breath, weakness, unexplained weight loss.  CHEST - 2 VIEW  Comparison: 07/29/2010 and earlier.  Findings: Stable right chest cardiac AICD. Stable  cardiomegaly and mediastinal contours.  Lower cervical ACDF hardware partially visible.  Chronic retrocardiac hypoventilation is stable. Occasional calcified pleural plaques again noted.  No pneumothorax, pulmonary edema or definite effusion. No acute osseous abnormality identified.  Partially visible lumbar fusion hardware.  IMPRESSION: Stable cardiomegaly.  No acute cardiopulmonary abnormality.  Original Report Authenticated By: Harley Hallmark, M.D.             Braeley Buskey A. Patrica Duel, MD 05/24/11 1236     Patient is reassessed, feels somewhat better, but still feels quite weak. Will treat for bronchitis and mild lactic acidosis. Possible occult sepsis, but afebrile adn BP improving with fluids.  [facility] paged for admission. Remained hemodynamically stable. Also, may need influenza testing as an inpatient.  Esly Selvage A. Patrica Duel, MD 05/24/11 1529  Theron Arista A. Patrica Duel, MD 05/24/11 1531  Theron Arista A. Patrica Duel, MD 05/24/11 1541

## 2011-05-24 NOTE — ED Notes (Signed)
Pt requesting something to eat and drink. Spoke to MD who states he can have ginger ale and crackers . Given also notified pt and spouse that blood glucose is 142.

## 2011-05-24 NOTE — ED Notes (Signed)
Pt and spouse report he began getting short of breath a week ago has been getting weaker and has lost 15 pounds in past week to ten days states he does eat but is still losing weight. Pt is afebrile.no history of fever or chills

## 2011-05-25 ENCOUNTER — Encounter (HOSPITAL_COMMUNITY): Payer: Self-pay | Admitting: Cardiology

## 2011-05-25 ENCOUNTER — Telehealth: Payer: Self-pay

## 2011-05-25 DIAGNOSIS — I313 Pericardial effusion (noninflammatory): Secondary | ICD-10-CM | POA: Insufficient documentation

## 2011-05-25 DIAGNOSIS — I340 Nonrheumatic mitral (valve) insufficiency: Secondary | ICD-10-CM | POA: Insufficient documentation

## 2011-05-25 DIAGNOSIS — Z789 Other specified health status: Secondary | ICD-10-CM | POA: Insufficient documentation

## 2011-05-25 DIAGNOSIS — M4800 Spinal stenosis, site unspecified: Secondary | ICD-10-CM | POA: Insufficient documentation

## 2011-05-25 DIAGNOSIS — I4891 Unspecified atrial fibrillation: Secondary | ICD-10-CM | POA: Insufficient documentation

## 2011-05-25 DIAGNOSIS — R0602 Shortness of breath: Secondary | ICD-10-CM

## 2011-05-25 DIAGNOSIS — I808 Phlebitis and thrombophlebitis of other sites: Secondary | ICD-10-CM | POA: Insufficient documentation

## 2011-05-25 DIAGNOSIS — Z889 Allergy status to unspecified drugs, medicaments and biological substances status: Secondary | ICD-10-CM | POA: Insufficient documentation

## 2011-05-25 DIAGNOSIS — I358 Other nonrheumatic aortic valve disorders: Secondary | ICD-10-CM | POA: Insufficient documentation

## 2011-05-25 DIAGNOSIS — E663 Overweight: Secondary | ICD-10-CM | POA: Insufficient documentation

## 2011-05-25 DIAGNOSIS — J90 Pleural effusion, not elsewhere classified: Secondary | ICD-10-CM | POA: Insufficient documentation

## 2011-05-25 DIAGNOSIS — I2510 Atherosclerotic heart disease of native coronary artery without angina pectoris: Secondary | ICD-10-CM | POA: Insufficient documentation

## 2011-05-25 DIAGNOSIS — E860 Dehydration: Secondary | ICD-10-CM

## 2011-05-25 DIAGNOSIS — I4589 Other specified conduction disorders: Secondary | ICD-10-CM | POA: Insufficient documentation

## 2011-05-25 DIAGNOSIS — I3139 Other pericardial effusion (noninflammatory): Secondary | ICD-10-CM | POA: Insufficient documentation

## 2011-05-25 DIAGNOSIS — I517 Cardiomegaly: Secondary | ICD-10-CM

## 2011-05-25 DIAGNOSIS — I872 Venous insufficiency (chronic) (peripheral): Secondary | ICD-10-CM | POA: Insufficient documentation

## 2011-05-25 LAB — HEMOGLOBIN A1C
Hgb A1c MFr Bld: 6.7 % — ABNORMAL HIGH (ref <5.7)
Mean Plasma Glucose: 146 mg/dL — ABNORMAL HIGH (ref <117)

## 2011-05-25 LAB — CARDIAC PANEL(CRET KIN+CKTOT+MB+TROPI)
CK, MB: 1.8 ng/mL (ref 0.3–4.0)
CK, MB: 1.6 ng/mL (ref 0.3–4.0)
Relative Index: INVALID (ref 0.0–2.5)
Total CK: 28 U/L (ref 7–232)
Total CK: 32 U/L (ref 7–232)
Troponin I: <0.3 ng/mL (ref <0.30)

## 2011-05-25 LAB — LACTIC ACID, PLASMA: Lactic Acid, Venous: 1.6 mmol/L (ref 0.5–2.2)

## 2011-05-25 LAB — INFLUENZA PANEL BY PCR (TYPE A & B): Influenza B By PCR: NEGATIVE

## 2011-05-25 LAB — TSH: TSH: 0.579 u[IU]/mL (ref 0.350–4.500)

## 2011-05-25 LAB — GLUCOSE, CAPILLARY: Glucose-Capillary: 115 mg/dL — ABNORMAL HIGH (ref 70–99)

## 2011-05-25 MED ORDER — POTASSIUM CHLORIDE CRYS ER 10 MEQ PO TBCR
10.0000 meq | EXTENDED_RELEASE_TABLET | Freq: Two times a day (BID) | ORAL | Status: DC
  Administered 2011-05-25 – 2011-05-27 (×5): 10 meq via ORAL
  Filled 2011-05-25 (×6): qty 1

## 2011-05-25 MED ORDER — CARVEDILOL 25 MG PO TABS
25.0000 mg | ORAL_TABLET | Freq: Two times a day (BID) | ORAL | Status: DC
  Administered 2011-05-26 – 2011-05-27 (×3): 25 mg via ORAL
  Filled 2011-05-25 (×5): qty 1

## 2011-05-25 MED ORDER — WARFARIN SODIUM 2.5 MG PO TABS
2.5000 mg | ORAL_TABLET | Freq: Once | ORAL | Status: AC
  Administered 2011-05-25: 2.5 mg via ORAL
  Filled 2011-05-25: qty 1

## 2011-05-25 MED ORDER — CARVEDILOL 25 MG PO TABS: 25.0000 mg | ORAL_TABLET | Freq: Two times a day (BID) | ORAL | Status: DC

## 2011-05-25 MED ORDER — ATORVASTATIN CALCIUM 20 MG PO TABS
20.0000 mg | ORAL_TABLET | Freq: Every day | ORAL | Status: DC
  Administered 2011-05-25 – 2011-05-26 (×2): 20 mg via ORAL
  Filled 2011-05-25 (×3): qty 1

## 2011-05-25 NOTE — Progress Notes (Addendum)
Subjective: Patient seen and examined ,feeling better today  Objective: Vital signs in last 24 hours: Temp:  [97.5 F (36.4 C)-98.2 F (36.8 C)] 97.5 F (36.4 C) (03/06 1600) Pulse Rate:  [63-76] 76  (03/06 1600) Resp:  [12-25] 25  (03/06 1600) BP: (96-128)/(61-83) 128/80 mmHg (03/06 1600) SpO2:  [95 %-99 %] 95 % (03/06 1600) Weight:  [112.5 kg (248 lb 0.3 oz)] 112.5 kg (248 lb 0.3 oz) (03/06 0440) Weight change:  Last BM Date: 05/23/11  Intake/Output from previous day: 03/05 0701 - 03/06 0700 In: 840 [P.O.:240; IV Piggyback:600] Out: 700 [Urine:700]     Physical Exam: General: Alert, awake, oriented x3, in no acute distress. Heart: Regular rate and rhythm, systolic  Murmur at the apex,  Lungs: Clear to auscultation bilaterally. Abdomen: Soft, nontender, nondistended, positive bowel sounds. Extremities: No clubbing cyanosis or edema with positive pedal pulses. Neuro: Grossly intact, nonfocal.    Lab Results: Results for orders placed during the hospital encounter of 05/24/11 (from the past 24 hour(s))  URINALYSIS, ROUTINE W REFLEX MICROSCOPIC     Status: Normal   Collection Time   05/24/11  9:14 PM      Component Value Range   Color, Urine YELLOW  YELLOW    APPearance CLEAR  CLEAR    Specific Gravity, Urine 1.016  1.005 - 1.030    pH 6.0  5.0 - 8.0    Glucose, UA NEGATIVE  NEGATIVE (mg/dL)   Hgb urine dipstick NEGATIVE  NEGATIVE    Bilirubin Urine NEGATIVE  NEGATIVE    Ketones, ur NEGATIVE  NEGATIVE (mg/dL)   Protein, ur NEGATIVE  NEGATIVE (mg/dL)   Urobilinogen, UA 1.0  0.0 - 1.0 (mg/dL)   Nitrite NEGATIVE  NEGATIVE    Leukocytes, UA NEGATIVE  NEGATIVE   INFLUENZA PANEL BY PCR     Status: Normal   Collection Time   05/24/11  9:44 PM      Component Value Range   Influenza A By PCR NEGATIVE  NEGATIVE    Influenza B By PCR NEGATIVE  NEGATIVE    H1N1 flu by pcr NOT DETECTED  NOT DETECTED   CARDIAC PANEL(CRET KIN+CKTOT+MB+TROPI)     Status: Normal   Collection Time    05/25/11  4:10 AM      Component Value Range   Total CK 28  7 - 232 (U/L)   CK, MB 1.8  0.3 - 4.0 (ng/mL)   Troponin I <0.30  <0.30 (ng/mL)   Relative Index RELATIVE INDEX IS INVALID  0.0 - 2.5   LACTIC ACID, PLASMA     Status: Normal   Collection Time   05/25/11  4:10 AM      Component Value Range   Lactic Acid, Venous 1.6  0.5 - 2.2 (mmol/L)  PROTIME-INR     Status: Abnormal   Collection Time   05/25/11  4:10 AM      Component Value Range   Prothrombin Time 25.8 (*) 11.6 - 15.2 (seconds)   INR 2.31 (*) 0.00 - 1.49   GLUCOSE, CAPILLARY     Status: Abnormal   Collection Time   05/25/11  8:20 AM      Component Value Range   Glucose-Capillary 115 (*) 70 - 99 (mg/dL)  CARDIAC PANEL(CRET KIN+CKTOT+MB+TROPI)     Status: Normal   Collection Time   05/25/11 11:17 AM      Component Value Range   Total CK 32  7 - 232 (U/L)   CK, MB 1.6  0.3 - 4.0 (ng/mL)   Troponin I <0.30  <0.30 (ng/mL)   Relative Index RELATIVE INDEX IS INVALID  0.0 - 2.5   GLUCOSE, CAPILLARY     Status: Abnormal   Collection Time   05/25/11 12:27 PM      Component Value Range   Glucose-Capillary 113 (*) 70 - 99 (mg/dL)   Comment 1 Notify RN     Comment 2 Documented in Chart      Studies/Results: Dg Chest 2 View  05/24/2011  *RADIOLOGY REPORT*  Clinical Data: 71 year old male with chest pain, cough, shortness of breath, weakness, unexplained weight loss.  CHEST - 2 VIEW  Comparison: 07/29/2010 and earlier.  Findings: Stable right chest cardiac AICD. Stable cardiomegaly and mediastinal contours.  Lower cervical ACDF hardware partially visible.  Chronic retrocardiac hypoventilation is stable. Occasional calcified pleural plaques again noted.  No pneumothorax, pulmonary edema or definite effusion. No acute osseous abnormality identified.  Partially visible lumbar fusion hardware.  IMPRESSION: Stable cardiomegaly.  No acute cardiopulmonary abnormality.  Original Report Authenticated By: Harley Hallmark, M.D.    Medications:      . amLODipine  5 mg Oral Daily  . aspirin EC  81 mg Oral Weekly  . atorvastatin  20 mg Oral q1800  . carvedilol  25 mg Oral BID WC  . cholecalciferol  1,000 Units Oral Daily  . colchicine  0.6 mg Oral Daily  . dextromethorphan-guaiFENesin  1 tablet Oral BID  . furosemide  40 mg Oral BID  . gabapentin  100 mg Oral BID  . glimepiride  1 mg Oral QAC breakfast  . ipratropium  1 spray Nasal Daily  . losartan  50 mg Oral BID  . methocarbamol  500 mg Oral BID  . piperacillin-tazobactam (ZOSYN)  IV  3.375 g Intravenous Q8H  . pneumococcal 23 valent vaccine  0.5 mL Intramuscular Tomorrow-1000  . potassium chloride  10 mEq Oral BID  . sodium chloride  1,000 mL Intravenous Once  . sodium chloride  3 mL Intravenous Q12H  . thiamine  100 mg Oral Daily  . vancomycin  1,500 mg Intravenous Q24H  . warfarin  2.5 mg Oral ONCE-1800  . warfarin  7.5 mg Oral Once  . Warfarin - Pharmacist Dosing Inpatient   Does not apply q1800  . DISCONTD: gabapentin  100 mg Oral TID  . DISCONTD: potassium chloride  10 mEq Oral BID  . DISCONTD: simvastatin  40 mg Oral q1800    albuterol, indomethacin, ipratropium, nitroGLYCERIN, oxyCODONE, oxyCODONE-acetaminophen     Assessment/Plan:  Principal Problem:  *Shortness of breath  - Likely secondary to  community-acquired pneumonia versus CHF   -chest x-ray showed  a stable cardiomegaly without infiltrate or  pulmonary edema.  Community acquired pneumonia  - continue  Vanco and Zosyn empirically,continue nebs  chronic systolic heart failure /Cardiomyopathy S/P ICD -Appreciate Dr Myrtis Ser input - 2-D echo Echo showed EF 50%  - lasix AM dose was held because of hypotention  ,will defer adjustment to cardiology. Continue coreg with parameters Strict intake and output  Hypotension:Improved  Stable,holding  (Amlodipine and losartan) ,will resume  Coreg with parameters if  - sepsis is unlikely given normal procalcitonin level and lactic acid  Echo showed no  pericardial effusion.continue empiric antibiotics and follow cultures TSH normal ,check random cortisol level. Active Problems:  DIABETES MELLITUS, CONTROLLED  -  A1c level 6.7% - Sliding-scale insulin   Atrial fibrillation  - Rate controlled  - Coumadin as per pharmacy protocol .INR therapeutic  CKD (chronic kidney disease), stage III  Baseline cr 1.5 ,rise in Cr noted,continue to monitor off  Losartan, defer lasix dose adjustment to cardiology.DC indocid  Hyperlipidemia  - Continue simvastatin  Pleural Thickening   Patient had some pleural thickening in 2011 with an effusion and a complete workup. DISPOSITION: Continue to monitor in ICU today      LOS: 1 day   [name] 05/25/2011, 9:05 PM

## 2011-05-25 NOTE — Telephone Encounter (Signed)
[facility] Triage Call Report Triage Record Num: [id number] Operator: Durward Mallard DiMatteis Patient Name: [name] Call Date & Time: 05/24/2011 9:52:59AM Patient Phone: [phone] PCP: [name] Patient Gender: Male PCP Fax : [phone] Patient DOB: 04-26-1940 Practice Name: [facility] Reason for Call: Caller: Constantinos/Mother; PCP: [name]; CB#: [phone]; Call regarding Cough and Weakness; Continuation of call from earlier today: pt is wheezing a little and feels worn out trying to breath; wife states that he has a hx of cardiac and resp problems; she refused wanting to go into his hx at this time; Triaged per Breathing Problems Guideline; Call 911 d/t worsening breathing problems and known cardiac and resp condition and not responding to tx available; wife states that she doesn't want to go to ER; she prefers he is seen in the office; office called and made aware and instructed there are no available appt for today; pt conferenced on the phone with office staff; instructed wife that pt needs to go to UC or ER; appt made for 11:15 today with Efraim Kaufmann, PA per office staff Protocol(s) Used: Breathing Problems Recommended Outcome per Protocol: Activate EMS 911 Reason for Outcome: Worsening breathing problems AND known cardiac or respiratory condition (coronary artery disease, angina, heart failure, asthma, chronic bronchitis, COPD) NOT responding to treatment OR treatment not available. Care Advice: ~ IMMEDIATE ACTION 03/  Patient was seen by Efraim Kaufmann and in the ED

## 2011-05-25 NOTE — Progress Notes (Signed)
05/25/11 2300  IV team recommends pt get a central line or PICC placed if pt is going to continue to receive Vancomysin due to poor vasculature.   Elisha Headland RN

## 2011-05-25 NOTE — Progress Notes (Signed)
Physical Therapy Evaluation Patient Details Name: [name] MRN: [id number] DOB: Aug 19, 1940 Today's Date: 05/25/2011  Problem List:  Patient Active Problem List  Diagnoses  . DIABETES MELLITUS, CONTROLLED  . GOUT  . PERIPHERAL NEUROPATHY  . CORONARY ARTERY DISEASE  . Acute on chronic systolic heart failure  . DVT  . DIVERTICULOSIS, COLON  . CKD (chronic kidney disease), stage III  . 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 BACK PAIN, LUMBAR  . CORONARY ARTERY BYPASS GRAFT, HX OF  . Hyperlipidemia  . Hypertension  . Atrial flutter  . Left atrial thrombus  . AAA (abdominal aortic aneurysm)  . S/P ICD (internal cardiac defibrillator) procedure  . Warfarin anticoagulation  . Paroxysmal ventricular tachycardia  . Community acquired pneumonia  . Shortness of breath  . Atrial fibrillation  . Wide-complex tachycardia  . Left ventricular ejection fraction less than 40%  . Spinal stenosis  . CAD (coronary artery disease)  . Cardiomyopathy  . CHF (congestive heart failure)  . Venous insufficiency  . Mitral regurgitation  . Aortic valve sclerosis  . Alcohol ingestion of more than four drinks per week  . Chronotropic incompetence  . Thrombophlebitis of superficial veins of upper extremities  . Pericardial effusion  . Overweight  . Pleural thickening  . Pleural effusion  . Ejection fraction < 50%  . Drug therapy    Past Medical History:  Past Medical History  Diagnosis Date  . Diabetes mellitus   . Hyperlipidemia   . Hypertension   . Pilonidal cyst   . Atrial fibrillation     Previous long-term amiodarone therapy with multiple cardioversions / amiodarone stopped September, 2009  . Atrial flutter     Started November, 2010, Left-sided and cannot ablate  . Left atrial thrombus     Remote past... cardioversions done since that time  . Wide-complex tachycardia   . Left ventricular ejection fraction less than 40%   . Gout   . AAA (abdominal aortic  aneurysm)     Surgical repair  . Discolored skin   . S/P ICD (internal cardiac defibrillator) procedure     Dr. Ladona Ridgel 2009... by the pacing  . SOB (shortness of breath)     Large left effusion/ thoracentesis/hospitalization/November, 2011... exudated.. cytology negative.. Dr.Wert.. no proof of mesothelioma  . Pericardial effusion   . Pleural effusion     Large loculated effusion on the left side November, 2011. This was tapped. It was exudative. Cytology revealed no cancer no proof of mesothelioma area pulmonary team felt that no further workup was needed  . S/P AAA repair   . Spinal stenosis     Surgery Dr.Elsner  . CAD (coronary artery disease)     Catheterization July, 2008... name and vein grafts patent but low cardiac output  . Warfarin anticoagulation   . Cardiomyopathy     Ischemic... ICD  . CHF (congestive heart failure)     EF 30-40%... echo.. November, 2008  /  EF 55-60% echo... November, 2011  . Venous insufficiency     Toe discoloration chronic  . Mitral regurgitation     Mild echo  . Aortic valve sclerosis   . Nasal drainage     Chronic  . Alcohol ingestion of more than four drinks per week     Excess beer  not a dependency problem  . Chronotropic incompetence     IV pacing rate adjusted  . Thrombophlebitis of superficial veins of upper extremities     Possible venous stenosis  from defibrillator  . Pericardial effusion     November, 2011 .. decreased during hospitalization  . Eye abnormality     Ophthalmologist questions a clot in one of his eyes, May, 2012  . Overweight     November, 2012  . Pleural thickening   . Ejection fraction < 50%     Ejection fraction has varied over time from 35-50%.,, Echoes are technically very difficult,,, EF 50%, echo, May 25, 2011, technically very difficult  . Drug therapy     Redness and swelling with Avelox infusion May 24, 2011   Past Surgical History:  Past Surgical History  Procedure Date  . Colonoscopy w/  polypectomy   . Abdominal aortic aneurysm repair   . Coronary artery bypass graft   . Icd      insertion  . Lumbar fusion   . Pilonidal cyst removal   . Surgery scrotal / testicular     PT Assessment/Plan/Recommendation PT Assessment Clinical Impression Statement: pt presents with CAP vs CHF exacerbation.  pt notes mobility has been limited for many years secodnary to back pain and multiple surgeries.  pt very motivated to improve mobility, however HR increased to low 150's nonsustained and 3-4 tele alarms for VT during ambulation.   PT Recommendation/Assessment: Patient will need skilled PT in the acute care venue PT Problem List: Decreased activity tolerance;Decreased balance;Decreased mobility;Decreased knowledge of use of DME;Cardiopulmonary status limiting activity;Pain Barriers to Discharge: None PT Therapy Diagnosis : Difficulty walking (Decondition/Debility) PT Plan PT Frequency: Min 3X/week PT Treatment/Interventions: DME instruction;Gait training;Stair training;Functional mobility training;Therapeutic activities;Therapeutic exercise;Balance training;Patient/family education PT Recommendation Recommendations for Other Services: OT consult Follow Up Recommendations: No PT follow up Equipment Recommended: None recommended by PT PT Goals  Acute Rehab PT Goals PT Goal Formulation: With patient Time For Goal Achievement: 2 weeks Pt will go Sit to Stand: with modified independence;with upper extremity assist PT Goal: Sit to Stand - Progress: Goal set today Pt will Ambulate: >150 feet;with modified independence;with least restrictive assistive device PT Goal: Ambulate - Progress: Goal set today Pt will Go Up / Down Stairs: 1-2 stairs;with supervision;with least restrictive assistive device PT Goal: Up/Down Stairs - Progress: Goal set today  PT Evaluation Precautions/Restrictions  Precautions Precautions: Fall Restrictions Weight Bearing Restrictions: No Prior Functioning    Home Living Lives With: Spouse Receives Help From: Family Type of Home: House Home Layout: One level Home Access: Stairs to enter Entrance Stairs-Rails: None Secretary/administrator of Steps: 2 Bathroom Shower/Tub: Health visitor: Handicapped height Bathroom Accessibility: Yes How Accessible: Accessible via walker Home Adaptive Equipment: Straight cane;Bedside commode/3-in-1;Shower chair with back;Walker - rolling;Wheelchair - manual;Reacher;Long-handled shoehorn Prior Function Level of Independence: Independent with basic ADLs;Independent with homemaking with ambulation;Independent with gait;Independent with transfers;Requires assistive device for independence Able to Take Stairs?: Yes Driving: Yes Vocation: Retired Designer, television/film set Level: Oriented X4 Sensation/Coordination   Extremity Assessment RUE Assessment RUE Assessment: Within Functional Limits LUE Assessment LUE Assessment: Within Functional Limits RLE Assessment RLE Assessment: Within Functional Limits LLE Assessment LLE Assessment: Within Functional Limits Mobility (including Balance) Bed Mobility Bed Mobility: Yes Supine to Sit: 7: Independent Sitting - Scoot to Edge of Bed: 7: Independent Transfers Transfers: Yes Sit to Stand: 5: Supervision;With upper extremity assist;From bed Sit to Stand Details (indicate cue type and reason): cues to use UEs Stand to Sit: 5: Supervision;With upper extremity assist;With armrests;To chair/3-in-1 Stand to Sit Details: cues to control descent Ambulation/Gait Ambulation/Gait: Yes Ambulation/Gait Assistance: 5: Supervision Ambulation/Gait Assistance Details (indicate cue  type and reason): pt used IV pole for support as he normaly uses a cane for distance.  pt's HR up to low 150's nonsustained, with multiple tele alarms for VT.  RN aware and watching monitors.  pt notes mildly SOB with ambulation.  pt noting Low Back "tightness", which is normal  for him.   Ambulation Distance (Feet): 200 Feet Assistive device:  (IV pole) Gait Pattern: Lateral trunk lean to right;Lateral trunk lean to left Stairs: No Wheelchair Mobility Wheelchair Mobility: No  Posture/Postural Control Posture/Postural Control: No significant limitations Balance Balance Assessed: No Exercise    End of Session PT - End of Session Equipment Utilized During Treatment: Gait belt Activity Tolerance: Patient limited by fatigue Patient left: in chair;with call bell in reach Nurse Communication: Mobility status for transfers;Mobility status for ambulation (Increased HR and tele alarming VT.  ) General Behavior During Session: [name] for tasks performed Cognition: [facility] for tasks performed  Sunny Schlein, [place] [phone] 05/25/2011, 2:55 PM

## 2011-05-25 NOTE — Progress Notes (Signed)
Utilization Review Completed.  Fintan Grater T  05/25/2011  

## 2011-05-25 NOTE — Progress Notes (Signed)
PT Cancellation Note  Treatment cancelled a this time due to patient receiving procedure or test. We will attempt to check back later today.  [name] 05/25/2011, 10:44 AM

## 2011-05-25 NOTE — Progress Notes (Signed)
Nutrition Screen-Brief Note  Chart reviewed due to nutrition risk report (15 lb unintentional weight loss in the past 6 days).  BMI=33.6 (class II obesity).  Patient reports that his PO intake has been very good PTA and now, but still lost weight.  He reports he was urinating "a lot" and takes Lasix twice a day.  Suspect weight loss is related to negative fluid balance.  PO intake of CHO-modified medium diet is good.  No nutrition intervention indicated at this time.  Please consult for further nutrition needs.  Hettie Holstein [phone]

## 2011-05-25 NOTE — Progress Notes (Signed)
ANTICOAGULATION CONSULT NOTE - Follow Up Consult  Pharmacy Consult for Coumadin Indication: atrial fibrillation  Assessment: 71 yo male on Coumadin for Afib. INR is therapeutic. Patient received 7.5 mg last night which is higher than home dose. Will give lower dose tonight. No bleeding noted.  Home regimen: 2.5 mg daily except 5 mg Wed and Sat  Goal of Therapy:  INR 2-3   Plan:  1. Coumadin 2.5 mg po tonight  Allergies  Allergen Reactions  . Avelox (Moxifloxacin Hcl In Nacl) Swelling, Rash and Other (See Comments)    Patient became hypotensive after infusion started  . Penicillins     REACTION: anaphylaxis    Patient Measurements: Height: 6' (182.9 cm) Weight: 248 lb 0.3 oz (112.5 kg) IBW/kg (Calculated) : 77.6   Vital Signs: Temp: 97.6 F (36.4 C) (03/06 0800) Temp src: Oral (03/06 0800) BP: 105/83 mmHg (03/06 0800) Pulse Rate: 63  (03/06 0800)  Labs:  Basename 05/25/11 0410 05/24/11 2005 05/24/11 2004 05/24/11 1254 05/24/11 1230  HGB -- 17.2* -- -- 17.3*  HCT -- 48.3 -- -- 49.5  PLT -- 165 -- -- 156  APTT -- 32 -- -- --  LABPROT 25.8* 24.9* -- 23.8* --  INR 2.31* 2.21* -- 2.09* --  HEPARINUNFRC -- -- -- -- --  CREATININE -- 2.12* -- -- 2.00*  CKTOTAL 28 -- 35 -- 38  CKMB 1.8 -- 1.4 -- 1.7  TROPONINI <0.30 -- <0.30 -- <0.30   Estimated Creatinine Clearance: 42 ml/min (by C-G formula based on Cr of 2.12).   Medications:  Scheduled:    . amLODipine  5 mg Oral Daily  . aspirin EC  81 mg Oral Weekly  . carvedilol  25 mg Oral BID WC  . cholecalciferol  1,000 Units Oral Daily  . colchicine  0.6 mg Oral Daily  . dextromethorphan-guaiFENesin  1 tablet Oral BID  . furosemide  40 mg Oral BID  . gabapentin  100 mg Oral BID  . gabapentin  100 mg Oral TID  . glimepiride  1 mg Oral QAC breakfast  . ipratropium  1 spray Nasal Daily  . losartan  50 mg Oral BID  . methocarbamol  500 mg Oral BID  . moxifloxacin  400 mg Intravenous Once  . piperacillin-tazobactam  (ZOSYN)  IV  3.375 g Intravenous Q8H  . pneumococcal 23 valent vaccine  0.5 mL Intramuscular Tomorrow-1000  . potassium chloride  10 mEq Oral BID  . simvastatin  40 mg Oral q1800  . sodium chloride  1,000 mL Intravenous Once  . sodium chloride  1,000 mL Intravenous Once  . sodium chloride  3 mL Intravenous Q12H  . thiamine  100 mg Oral Daily  . vancomycin  1,500 mg Intravenous Q24H  . warfarin  7.5 mg Oral Once  . Warfarin - Pharmacist Dosing Inpatient   Does not apply q1800  . DISCONTD: aspirin  81 mg Oral Weekly    [place], Sani Loiseau Danielle 05/25/2011,9:59 AM

## 2011-05-25 NOTE — Consult Note (Signed)
CARDIOLOGY CONSULT NOTE  Patient ID: [name] MRN: [id number] DOB/AGE: May 12, 1940 71 y.o.  Admit date: 05/24/2011 Referring Physician Primary PhysicianWilliam Alwyn Ren, MD, MD Primary Cardiologist    Jerral Bonito, MD  HPI:    The patient is seen in consultation to help with the evaluation of this acute shortness of breath and presentation to the hospital this admission. I have followed this patient's cardiac status for many years. His cardiology problems are complex. As part of today's evaluation I have reviewed reviewed her records back for several years. Specifically I reviewed an episode that occurred in November, 2011. At that time he presented short of breath with hypotension. At that time he had a pericardial effusion that disappeared spontaneously. He also had a pleural effusion with some pleural thickening. Decision was made at that time to proceed with analysis of the pleural fluid. This was exudative. The cytology was normal. It was felt that he did not have a mesothelioma. It was felt that he could be watched clinically. He has not had any recurrent problems of this sort since then.    Recently the patient had a prolonged cough. He had no significant productive sputum. He was feeling poorly in general. He says that his caloric intake was okay. However he lost approximately 11 pounds before coming into the hospital. He says that he was urinating actively. Despite this he presented with significant shortness of breath and he responded nicely to oxygen. This story certainly argues against his presentation being congestive heart failure. However we really do not have a definite other explanation. The chest x-ray did not show CHF and it did not show an obvious pneumonia. He is being treated with antibiotics for the possibility of bronchopneumonia or other issues. He already looks better.  I spent greater than one hour we reviewing this patient's data.   Review of systems:  Patient  denies fever, chills, headache, sweats,. He did develop a rash when he was given Avelox yesterday. This was localized to his right arm. He denies change in vision, change in hearing, chest pain, nausea vomiting, urinary symptoms. All other systems are reviewed and are negative.  Past Medical History  Diagnosis Date  . Diabetes mellitus   . Hyperlipidemia   . Hypertension   . Pilonidal cyst   . Atrial fibrillation     Previous long-term amiodarone therapy with multiple cardioversions / amiodarone stopped September, 2009  . Atrial flutter     Started November, 2010, Left-sided and cannot ablate  . Left atrial thrombus     Remote past... cardioversions done since that time  . Wide-complex tachycardia   . Left ventricular ejection fraction less than 40%   . Gout   . AAA (abdominal aortic aneurysm)     Surgical repair  . Discolored skin   . S/P ICD (internal cardiac defibrillator) procedure     Dr. Ladona Ridgel 2009... by the pacing  . SOB (shortness of breath)     Large left effusion/ thoracentesis/hospitalization/November, 2011... exudated.. cytology negative.. Dr.Wert.. no proof of mesothelioma  . Pericardial effusion   . Pleural effusion     Large loculated effusion on the left side November, 2011. This was tapped. It was exudative. Cytology revealed no cancer no proof of mesothelioma area pulmonary team felt that no further workup was needed  . S/P AAA repair   . Spinal stenosis     Surgery Dr.Elsner  . CAD (coronary artery disease)     Catheterization July, 2008... name and vein grafts  patent but low cardiac output  . Warfarin anticoagulation   . Cardiomyopathy     Ischemic... ICD  . CHF (congestive heart failure)     EF 30-40%... echo.. November, 2008  /  EF 55-60% echo... November, 2011  . Venous insufficiency     Toe discoloration chronic  . Mitral regurgitation     Mild echo  . Aortic valve sclerosis   . Nasal drainage     Chronic  . Alcohol ingestion of more than four  drinks per week     Excess beer  not a dependency problem  . Chronotropic incompetence     IV pacing rate adjusted  . Thrombophlebitis of superficial veins of upper extremities     Possible venous stenosis from defibrillator  . Pericardial effusion     November, 2011 .. decreased during hospitalization  . Eye abnormality     Ophthalmologist questions a clot in one of his eyes, May, 2012  . Overweight     November, 2012  . Pleural thickening   . Ejection fraction < 50%     Ejection fraction has varied over time from 35-50%.,, Echoes are technically very difficult,,, EF 50%, echo, May 25, 2011, technically very difficult  . Drug therapy     Redness and swelling with Avelox infusion May 24, 2011    Family History  Problem Relation Age of Onset  . Hypertension Mother   . Stroke Mother   . Diabetes Father   . Coronary artery disease Father     History   Social History  . Marital Status: Married    Spouse Name: N/A    Number of Children: N/A  . Years of Education: N/A   Occupational History  . Not on file.   Social History Main Topics  . Smoking status: Former Smoker    Quit date: 03/21/1990  . Smokeless tobacco: Former Neurosurgeon  . Alcohol Use: 0.0 oz/week    21-28 Cans of beer per week     beer  . Drug Use: No  . Sexually Active: Not on file   Other Topics Concern  . Not on file   Social History Narrative  . No narrative on file    Past Surgical History  Procedure Date  . Colonoscopy w/ polypectomy   . Abdominal aortic aneurysm repair   . Coronary artery bypass graft   . Icd      insertion  . Lumbar fusion   . Pilonidal cyst removal   . Surgery scrotal / testicular      Prescriptions prior to admission  Medication Sig Dispense Refill  . amLODipine (NORVASC) 5 MG tablet Take 1 tablet (5 mg total) by mouth daily.  90 tablet  3  . aspirin 81 MG tablet Take 81 mg by mouth once a week.       . carvedilol (COREG) 25 MG tablet Take 25 mg by mouth 2 (two) times  daily with a meal.       . celecoxib (CELEBREX) 200 MG capsule Take 200 mg by mouth daily.      . cholecalciferol (VITAMIN D) 1000 UNITS tablet Take 1,000 Units by mouth daily.      . furosemide (LASIX) 40 MG tablet Take 40 mg by mouth 2 (two) times daily. May take extra tablet if needed fluid      . gabapentin (NEURONTIN) 100 MG capsule Take 100 mg by mouth 2 (two) times daily.      Marland Kitchen glimepiride (AMARYL) 2 MG  tablet Take 1 mg by mouth daily before breakfast. 1/2 tab daily      . ipratropium (ATROVENT) 0.06 % nasal spray Place 1 spray into the nose daily.       Marland Kitchen losartan (COZAAR) 50 MG tablet Take 1 tablet (50 mg total) by mouth 2 (two) times daily.  180 tablet  3  . metFORMIN (GLUCOPHAGE) 500 MG tablet Take 500 mg by mouth daily.        . methocarbamol (ROBAXIN) 500 MG tablet Take 500 mg by mouth 2 (two) times daily.       Marland Kitchen oxyCODONE-acetaminophen (PERCOCET) 7.5-325 MG per tablet Take 1 tablet by mouth every 8 (eight) hours as needed. For pain      . potassium chloride (KLOR-CON) 20 MEQ packet Take 10 mEq by mouth 2 (two) times daily. 1/2 by mouth twice daily      . simvastatin (ZOCOR) 40 MG tablet Take 1 tablet (40 mg total) by mouth at bedtime.  90 tablet  1  . thiamine (VITAMIN B-1) 100 MG tablet Take 100 mg by mouth daily.      Marland Kitchen warfarin (COUMADIN) 2.5 MG tablet Take 2.5-5 mg by mouth daily. 2.5 mg daily except 5 mg on Wed and Sat      . ACCU-CHEK SOFTCLIX LANCETS lancets USE AS DIRECTED  100 each  4  . colchicine 0.6 MG tablet Take 0.6 mg by mouth 2 (two) times daily as needed. For gout      . glucose blood (ACCU-CHEK COMFORT CURVE) test strip Use as instructed  100 each  4  . indomethacin (INDOCIN) 25 MG capsule Take 25 mg by mouth as needed. For gout      . nitroGLYCERIN (NITROSTAT) 0.4 MG SL tablet Place 0.4 mg under the tongue every 5 (five) minutes as needed.          Physical Exam: Blood pressure 108/69, pulse 74, temperature 97.5 F (36.4 C), temperature source Oral, resp.  rate 19, height 6' (1.829 m), weight 248 lb 0.3 oz (112.5 kg), SpO2 99.00%.  The patient is oriented to person time and place. Affect is normal. His wife is in the room with him. Head is atraumatic. There is no jugular venous distention. Lungs reveal scattered rhonchi. Cardiac exam reveals S1 and S2. There is a soft systolic murmur. His abdomen is protuberant. He usually looks this way. It is not tender at this time. He has no significant peripheral edema. The rash on his right antecubital space of his arm is resolving. He has residual of the old slight grayness to his scan that was consistent with his old amiodarone therapy. This has improved over time.   Labs:   Lab Results  Component Value Date   WBC 7.9 05/24/2011   HGB 17.2* 05/24/2011   HCT 48.3 05/24/2011   MCV 90.4 05/24/2011   PLT 165 05/24/2011    Lab 05/24/11 2005  NA 137  K 3.8  CL 95*  CO2 27  BUN 49*  CREATININE 2.12*  CALCIUM 9.5  PROT 6.9  BILITOT 0.6  ALKPHOS 69  ALT 14  AST 13  GLUCOSE 107*   Lab Results  Component Value Date   CKTOTAL 32 05/25/2011   CKMB 1.6 05/25/2011   TROPONINI <0.30 05/25/2011    Lab Results  Component Value Date   CHOL 166 12/17/2010   CHOL 145 01/07/2010   CHOL 192 06/11/2008   Lab Results  Component Value Date   HDL 60.70 12/17/2010  HDL 51.10 01/07/2010   HDL 81.50 06/11/2008   Lab Results  Component Value Date   LDLCALC 83 12/17/2010   LDLCALC 74 01/07/2010   LDLCALC 97 06/11/2008   Lab Results  Component Value Date   TRIG 110.0 12/17/2010   TRIG 102.0 01/07/2010   TRIG 70.0 06/11/2008   Lab Results  Component Value Date   CHOLHDL 3 12/17/2010   CHOLHDL 3 01/07/2010   CHOLHDL 2 06/11/2008   No results found for this basename: LDLDIRECT      Radiology: Dg Chest 2 View  05/24/2011  *RADIOLOGY REPORT*  Clinical Data: 71 year old male with chest pain, cough, shortness of breath, weakness, unexplained weight loss.  CHEST - 2 VIEW  Comparison: 07/29/2010 and earlier.  Findings: Stable  right chest cardiac AICD. Stable cardiomegaly and mediastinal contours.  Lower cervical ACDF hardware partially visible.  Chronic retrocardiac hypoventilation is stable. Occasional calcified pleural plaques again noted.  No pneumothorax, pulmonary edema or definite effusion. No acute osseous abnormality identified.  Partially visible lumbar fusion hardware.  IMPRESSION: Stable cardiomegaly.  No acute cardiopulmonary abnormality.  Original Report Authenticated By: Harley Hallmark, M.D.   EKG:  The patient's EKG is paced. I have reviewed personally.  ASSESSMENT AND PLAN:    *Shortness of breath    The patient presented feeling short of breath and looking ill. I have outlined a discussion at the beginning of this note. The etiology remains unclear. I am not convinced that there was any sign of congestive heart failure. The patient has not diarrhea since being here and he is feeling better with oxygen and antibiotics. His renal function is worse than usual. I suspect that it will improve with some hydration. However his chest x-ray does not show an obvious pneumonia. Consideration could be given to followup chest x-ray to see if it becomes abnormal with hydration. Regardless, I feel he should be treated with ongoing antibiotics.   CKD (chronic kidney disease),      Over time the patient's creatinine has varied. However more recently his creatinine has been in the range of 1.5-1.7. His current creatinine is higher than baseline. I am hopeful that this will improve with mild hydration.   Hypertension   The patient's blood pressure has been quite unusual as an outpatient. He tends to wake up in the morning with a significantly elevated blood pressure. However after he takes his morning meds he frequently develops normalization of his pressure and sometimes hypotension. I have tried multiple combination of medicines and had not been able to completely stop this pattern. Some of medicines are given specifically  at night to help with the elevated morning blood pressure.   Atrial flutter    The patient has had atrial arrhythmias. He is on Coumadin. His flutter is left-sided and cannot be ablated. No further workup.   S/P ICD (internal cardiac defibrillator)   The patient's ICD is in place. He paces a good bit of the time.   Warfarin anticoagulation   Warfarin is to be continued.   Community acquired pneumonia   I think it is possible that he has a community acquired pneumonia even though we have not seen many of the findings it should be present.   CAD (coronary artery disease)    Coronary disease is stable. This does not appear to have been a primary cardiac ischemic event.   Cardiomyopathy    Historically his ejection fraction has varied. He has an ICD in place. Now would not be a  good time to try to further titrate his medications.   Mitral regurgitation  Aortic valve sclerosis    His valvular heart disease has been mild. No further workup   Chronotropic incompetence    He paces. There is no reason to believe that chronotropic incompetence is playing a role with the current admission.   Pericardial effusion    The patient did have an unexplained significant pericardial effusion in 2011. The etiology was not clear. It disappeared. The current echo does not appear to show significant pericardial effusion at this time although the study is technically difficult.   Pleural Thickening    I outlined in the beginning of this note that the patient had some pleural thickening in 2011 with an effusion and a complete workup. He does not seem to have evidence of significant pleural effusions at this time. No further workup.   Ejection fraction     His ejection fraction varies. His echoes are difficult. The ejection fraction noted from today appears to be in the 50% range. However the study is technically very limited. We have to be careful with this information    Drug therapy  It appears that the  patient had some type of reaction when he received Avelox yesterday. It is being listed as an allergy. There was a local reaction in his arm.  Jerral Bonito, MD

## 2011-05-26 LAB — PROTIME-INR
INR: 3.19 — ABNORMAL HIGH (ref 0.00–1.49)
Prothrombin Time: 33.2 seconds — ABNORMAL HIGH (ref 11.6–15.2)

## 2011-05-26 LAB — BASIC METABOLIC PANEL
Chloride: 96 mEq/L (ref 96–112)
GFR calc Af Amer: 43 mL/min — ABNORMAL LOW (ref >90)
GFR calc non Af Amer: 37 mL/min — ABNORMAL LOW (ref >90)
Potassium: 3.9 mEq/L (ref 3.5–5.1)
Sodium: 138 mEq/L (ref 135–145)

## 2011-05-26 LAB — URINE CULTURE: Culture  Setup Time: 201303060249

## 2011-05-26 LAB — GLUCOSE, CAPILLARY
Glucose-Capillary: 153 mg/dL — ABNORMAL HIGH (ref 70–99)
Glucose-Capillary: 167 mg/dL — ABNORMAL HIGH (ref 70–99)

## 2011-05-26 MED FILL — Perflutren Lipid Microsphere IV Susp 6.52 MG/ML: INTRAVENOUS | Qty: 2 | Status: AC

## 2011-05-26 NOTE — Telephone Encounter (Signed)
I've seen the patient in the hospital since this phone call.

## 2011-05-26 NOTE — Progress Notes (Signed)
Patient ID: [name], male   DOB: 1940/06/22, 71 y.o.   MRN: [id number] SUBJECTIVE:  Please refer to my extensive consult note yesterday. The patient continues to improve. The exact etiology of his presentation remains somewhat unclear. In my note yesterday I felt that it was primarily an unusual pneumonia. I had thought his diuretics were on hold. It seems that he was receiving oral Lasix yesterday. There has been some diuresis. I have just ordered chemistry labs for this morning. Until we have all of this data I have stopped his Lasix.   Filed Vitals:   05/25/11 1925 05/26/11 0005 05/26/11 0325 05/26/11 0500  BP: 118/77 110/77 117/76   Pulse: 65 76 77   Temp: 97.5 F (36.4 C) 98.2 F (36.8 C) 97.8 F (36.6 C)   TempSrc: Oral Oral Oral   Resp: 21 18 14    Height:      Weight:    245 lb 13 oz (111.5 kg)  SpO2: 87% 97% 97%     Intake/Output Summary (Last 24 hours) at 05/26/11 0820 Last data filed at 05/26/11 0600  Gross per 24 hour  Intake    650 ml  Output   2125 ml  Net  -1475 ml    LABS: Basic Metabolic Panel:  Basename 05/24/11 2005 05/24/11 1230  NA 137 137  K 3.8 3.9  CL 95* 94*  CO2 27 28  GLUCOSE 107* 146*  BUN 49* 44*  CREATININE 2.12* 2.00*  CALCIUM 9.5 9.6  MG 2.4 --  PHOS 5.3* --   Liver Function Tests:  [facility] 05/24/11 2005  AST 13  ALT 14  ALKPHOS 69  BILITOT 0.6  PROT 6.9  ALBUMIN 3.5   No results found for this basename: LIPASE:2,AMYLASE:2 in the last 72 hours CBC:  Basename 05/24/11 2005 05/24/11 1230  WBC 7.9 7.5  NEUTROABS 5.8 5.8  HGB 17.2* 17.3*  HCT 48.3 49.5  MCV 90.4 89.0  PLT 165 156   Cardiac Enzymes:  Basename 05/25/11 1117 05/25/11 0410 05/24/11 2004  CKTOTAL 32 28 35  CKMB 1.6 1.8 1.4  CKMBINDEX -- -- --  TROPONINI <0.30 <0.30 <0.30   BNP: No components found with this basename: POCBNP:3 D-Dimer: No results found for this basename: DDIMER:2 in the last 72 hours Hemoglobin A1C:  Basename 05/24/11  2005  HGBA1C 6.7*   Fasting Lipid Panel: No results found for this basename: CHOL,HDL,LDLCALC,TRIG,CHOLHDL,LDLDIRECT in the last 72 hours Thyroid Function Tests:  Basename 05/24/11 2005  TSH 0.579  T4TOTAL --  T3FREE --  THYROIDAB --    RADIOLOGY: Dg Chest 2 View  05/24/2011  *RADIOLOGY REPORT*  Clinical Data: 71 year old male with chest pain, cough, shortness of breath, weakness, unexplained weight loss.  CHEST - 2 VIEW  Comparison: 07/29/2010 and earlier.  Findings: Stable right chest cardiac AICD. Stable cardiomegaly and mediastinal contours.  Lower cervical ACDF hardware partially visible.  Chronic retrocardiac hypoventilation is stable. Occasional calcified pleural plaques again noted.  No pneumothorax, pulmonary edema or definite effusion. No acute osseous abnormality identified.  Partially visible lumbar fusion hardware.  IMPRESSION: Stable cardiomegaly.  No acute cardiopulmonary abnormality.  Original Report Authenticated By: Harley Hallmark, M.D.    PHYSICAL EXAM  Patient is oriented to person time and place. Affect is normal. Lungs reveal scattered rhonchi. Cardiac exam reveals S1 and S2. There no clicks or significant murmurs. The abdomen is protuberant. This is old. It does not feel tense at this time. There is no significant peripheral edema.  TELEMETRY: I personally reviewed telemetry. There is pacing.   ASSESSMENT AND PLAN:   *Shortness of breath     This is the patient's presenting symptom. I feel that we should continue to treat him with antibiotics for the possibility of an unusual pneumonia. I have put his diuretic on hold for today. I have not resumed any of his other medications for left ventricular dysfunction yet. We need to follow his renal function and his blood pressure.   CKD (chronic kidney disease), stage III     I just ordered chemistry lab for today.   Hypertension   As of today his blood pressure is on the lower side. Ultimately we will need to resume  his medications for his blood pressure.   Community acquired pneumonia   Although it seems unusual I feel that we should continue to treat him for this diagnosis.   Atrial fibrillation    The patient will be seen by my team tomorrow. I will be in [place] and I can be reached on my cell phone [phone]. I will be working this weekend and can see him on Saturday.   [name] 05/26/2011 8:20 AM

## 2011-05-26 NOTE — Progress Notes (Signed)
Patient had 6 beats of VTach.  Patient asymptomatic and resting comfortably.  MD notified.  Will continue to monitor. Nolon Nations

## 2011-05-26 NOTE — Progress Notes (Signed)
Clinical social worker received referral for potential disposition needs. Physical therapy recommended discharge home with no follow up services. No further csw needs, sign off.   Catha Gosselin, Theresia Majors  [phone] .05/26/2011 12:28pm

## 2011-05-26 NOTE — Progress Notes (Signed)
Subjective: Patient seen and examined ,feeling a lot better today   Objective: Vital signs in last 24 hours: Temp:  [97.5 F (36.4 C)-98.3 F (36.8 C)] 98.3 F (36.8 C) (03/07 0800) Pulse Rate:  [65-81] 81  (03/07 0800) Resp:  [14-25] 15  (03/07 0800) BP: (108-128)/(69-80) 117/76 mmHg (03/07 0325) SpO2:  [87 %-99 %] 95 % (03/07 0800) Weight:  [111.5 kg (245 lb 13 oz)] 111.5 kg (245 lb 13 oz) (03/07 0500) Weight change: 0.3 kg (10.6 oz) Last BM Date: 05/25/11  Intake/Output from previous day: 03/06 0701 - 03/07 0700 In: 650 [IV Piggyback:650] Out: 2625 [Urine:2625] Total I/O In: 150 [P.O.:150] Out: -    Physical Exam: General: Alert, awake, oriented x3, in no acute distress.  Heart: Regular rate and rhythm, systolic Murmur at the apex,  Lungs: decreased BS ,scattered ronchi Abdomen: Soft, nontender, nondistended, positive bowel sounds.  Extremities: No clubbing cyanosis or edema with positive pedal pulses.  Neuro: Grossly intact, nonfocal.        Lab Results: Results for orders placed during the hospital encounter of 05/24/11 (from the past 24 hour(s))  GLUCOSE, CAPILLARY     Status: Abnormal   Collection Time   05/25/11 12:27 PM      Component Value Range   Glucose-Capillary 113 (*) 70 - 99 (mg/dL)   Comment 1 Notify RN     Comment 2 Documented in Chart    PROTIME-INR     Status: Abnormal   Collection Time   05/26/11  4:45 AM      Component Value Range   Prothrombin Time 33.2 (*) 11.6 - 15.2 (seconds)   INR 3.19 (*) 0.00 - 1.49   GLUCOSE, CAPILLARY     Status: Abnormal   Collection Time   05/26/11  8:23 AM      Component Value Range   Glucose-Capillary 153 (*) 70 - 99 (mg/dL)   Comment 1 Notify RN    BASIC METABOLIC PANEL     Status: Abnormal   Collection Time   05/26/11  9:53 AM      Component Value Range   Sodium 138  135 - 145 (mEq/L)   Potassium 3.9  3.5 - 5.1 (mEq/L)   Chloride 96  96 - 112 (mEq/L)   CO2 27  19 - 32 (mEq/L)   Glucose, Bld 229 (*) 70 -  99 (mg/dL)   BUN 47 (*) 6 - 23 (mg/dL)   Creatinine, Ser 8.65 (*) 0.50 - 1.35 (mg/dL)   Calcium 8.9  8.4 - 78.4 (mg/dL)   GFR calc non Af Amer 37 (*) >90 (mL/min)   GFR calc Af Amer 43 (*) >90 (mL/min)    Studies/Results: Dg Chest 2 View  05/24/2011  *RADIOLOGY REPORT*  Clinical Data: 71 year old male with chest pain, cough, shortness of breath, weakness, unexplained weight loss.  CHEST - 2 VIEW  Comparison: 07/29/2010 and earlier.  Findings: Stable right chest cardiac AICD. Stable cardiomegaly and mediastinal contours.  Lower cervical ACDF hardware partially visible.  Chronic retrocardiac hypoventilation is stable. Occasional calcified pleural plaques again noted.  No pneumothorax, pulmonary edema or definite effusion. No acute osseous abnormality identified.  Partially visible lumbar fusion hardware.  IMPRESSION: Stable cardiomegaly.  No acute cardiopulmonary abnormality.  Original Report Authenticated By: Harley Hallmark, M.D.    Medications:    . aspirin EC  81 mg Oral Weekly  . atorvastatin  20 mg Oral q1800  . carvedilol  25 mg Oral BID WC  . cholecalciferol  1,000 Units Oral Daily  . colchicine  0.6 mg Oral Daily  . dextromethorphan-guaiFENesin  1 tablet Oral BID  . gabapentin  100 mg Oral BID  . glimepiride  1 mg Oral QAC breakfast  . ipratropium  1 spray Nasal Daily  . methocarbamol  500 mg Oral BID  . piperacillin-tazobactam (ZOSYN)  IV  3.375 g Intravenous Q8H  . potassium chloride  10 mEq Oral BID  . sodium chloride  1,000 mL Intravenous Once  . sodium chloride  3 mL Intravenous Q12H  . thiamine  100 mg Oral Daily  . vancomycin  1,500 mg Intravenous Q24H  . warfarin  2.5 mg Oral ONCE-1800  . Warfarin - Pharmacist Dosing Inpatient   Does not apply q1800  . DISCONTD: amLODipine  5 mg Oral Daily  . DISCONTD: carvedilol  25 mg Oral BID WC  . DISCONTD: carvedilol  25 mg Oral BID WC  . DISCONTD: furosemide  40 mg Oral BID  . DISCONTD: losartan  50 mg Oral BID     albuterol, ipratropium, nitroGLYCERIN, oxyCODONE, oxyCODONE-acetaminophen, DISCONTD: indomethacin     Assessment/Plan: *Shortness of breath  - Improving , Likely secondary to community-acquired pneumonia  -chest x-ray showed a stable cardiomegaly without infiltrate or pulmonary edema.  Community acquired pneumonia  - continue Vanco and Zosyn empirically, can switch to PO meds tomorrow ,continue nebs  chronic systolic heart failure /Cardiomyopathy S/P ICD  -Appreciate Dr Myrtis Ser follow up  - 2-D echo Echo showed EF 50%  - lasix was held . Continue coreg with parameters  Strict intake and output  Hypotension:resolved  Continue holding (Amlodipine and losartan) ,continue  Coreg with parameters   - sepsis is unlikely given normal procalcitonin level and lactic acid ,blood cultures sterile so far  Echo showed no pericardial effusion.continue empiric antibiotics and follow cultures  TSH normal ,check random cortisol level.  Active Problems:  DIABETES MELLITUS, CONTROLLED  - A1c level 6.7%  - Sliding-scale insulin  Atrial fibrillation  - Rate controlled  - Coumadin as per pharmacy protocol .INR supratherapeutic  CKD (chronic kidney disease), stage III  Baseline cr 1.5 ,rise in Cr noted but now trending down ,continue to monitor off Losartan.avoid NSAIDS  Hyperlipidemia  - Continue simvastatin  Pleural Thickening  Patient had some pleural thickening in 2011 with an effusion and a complete workup.  DISPOSITION: transfer to telemetry today , possible D/C home in 1-2 days ,seen by PT ,no follow up recommended ,will consult OT as per recommendations      LOS: 2 days   Aniesha Haughn 05/26/2011, 11:40 AM

## 2011-05-26 NOTE — Progress Notes (Signed)
ANTICOAGULATION CONSULT NOTE - Follow Up Consult  Pharmacy Consult for Coumadin Indication: atrial fibrillation  Patient Measurements: Height: 6' (182.9 cm) Weight: 245 lb 13 oz (111.5 kg) IBW/kg (Calculated) : 77.6    Vital Signs: Temp: 98.5 F (36.9 C) (03/07 1200) Temp src: Oral (03/07 1200) BP: 117/76 mmHg (03/07 0325) Pulse Rate: 80  (03/07 1200)  Labs:  Basename 05/26/11 0953 05/26/11 0445 05/25/11 1117 05/25/11 0410 05/24/11 2005 05/24/11 2004 05/24/11 1230  HGB -- -- -- -- 17.2* -- 17.3*  HCT -- -- -- -- 48.3 -- 49.5  PLT -- -- -- -- 165 -- 156  APTT -- -- -- -- 32 -- --  LABPROT -- 33.2* -- 25.8* 24.9* -- --  INR -- 3.19* -- 2.31* 2.21* -- --  HEPARINUNFRC -- -- -- -- -- -- --  CREATININE 1.76* -- -- -- 2.12* -- 2.00*  CKTOTAL -- -- 32 28 -- 35 --  CKMB -- -- 1.6 1.8 -- 1.4 --  TROPONINI -- -- <0.30 <0.30 -- <0.30 --   Estimated Creatinine Clearance: 50.4 ml/min (by C-G formula based on Cr of 1.76).  Assessment: 71yom continuing Coumadin for Afib. INR (3.19) is supratherapeutic. The significant increase in INR is mostly likely due to the 7.5mg  dose given on 3/6 which is greater than the patient's home regimen (2.5mg  daily except 5mg  on Wed and Sat).  - No CBC today - No significant bleeding reported  Goal of Therapy:  INR 2-3   Plan:  1. No Coumadin today - may be able to restart patient's home regimen tomorrow (3/8) if INR decreases appropriately 2. Follow-up AM INR   Cleon Dew [phone] 05/26/2011,2:34 PM

## 2011-05-26 NOTE — Progress Notes (Signed)
Physical Therapy Treatment and Discharge Note Patient Details Name: [name] Hannan MRN: [id number] DOB: 07-Apr-1940 Today's Date: 05/26/2011  PT Assessment/Plan  PT - Assessment/Plan Comments on Treatment Session: Patient feels at baseline as limited with mobility secondary to back PTA. Declines stairs as feels that he won't have an issue. We will sign off and defer ambulation to nursing supervision as patient has no skilled PT needs. PT Plan: All goals met and education completed, patient dischaged from PT services - with the exception of stair goal as patient does not feel it is necessary. Follow Up Recommendations: No PT follow up Equipment Recommended: None recommended by PT PT Goals  Acute Rehab PT Goals PT Goal: Sit to Stand - Progress: Met PT Goal: Ambulate - Progress: Met PT Goal: Up/Down Stairs - Progress: Discontinued (comment)  PT Treatment Precautions/Restrictions  Precautions Precautions: Fall Restrictions Weight Bearing Restrictions: No Mobility (including Balance) Bed Mobility Supine to Sit: 7: Independent Sitting - Scoot to Edge of Bed: 7: Independent Sit to Supine: 7: Independent Transfers Sit to Stand: 7: Independent;From bed Stand to Sit: 7: Independent;To bed Ambulation/Gait Ambulation/Gait Assistance: 6: Modified independent (Device/Increase time) Ambulation/Gait Assistance Details (indicate cue type and reason): One short standing rest break secondary to back pain. HR no greater than 122 with gait today and no dyspnea on exertion.  Ambulation Distance (Feet): 200 Feet Assistive device: Straight cane Gait Pattern:  (Waddling)    End of Session PT - End of Session Equipment Utilized During Treatment: Gait belt Activity Tolerance: Patient tolerated treatment well Patient left: in bed;with call bell in reach;with family/visitor present Nurse Communication: Mobility status for ambulation General Behavior During Session: [facility] for tasks  performed Cognition: [facility] for tasks performed  Edwyna Perfect, PT  Pager [phone]  05/26/2011, 10:58 AM

## 2011-05-27 ENCOUNTER — Telehealth: Payer: Self-pay | Admitting: Internal Medicine

## 2011-05-27 LAB — CBC
HCT: 44.9 % (ref 39.0–52.0)
MCH: 31.9 pg (ref 26.0–34.0)
MCV: 89.6 fL (ref 78.0–100.0)
Platelets: 165 10*3/uL (ref 150–400)
RDW: 14.3 % (ref 11.5–15.5)
WBC: 8.1 10*3/uL (ref 4.0–10.5)

## 2011-05-27 LAB — BASIC METABOLIC PANEL
BUN: 35 mg/dL — ABNORMAL HIGH (ref 6–23)
Calcium: 9 mg/dL (ref 8.4–10.5)
Chloride: 100 mEq/L (ref 96–112)
Creatinine, Ser: 1.69 mg/dL — ABNORMAL HIGH (ref 0.50–1.35)
GFR calc Af Amer: 46 mL/min — ABNORMAL LOW (ref >90)

## 2011-05-27 MED ORDER — POTASSIUM CHLORIDE CRYS ER 20 MEQ PO TBCR
40.0000 meq | EXTENDED_RELEASE_TABLET | Freq: Once | ORAL | Status: AC
  Administered 2011-05-27: 40 meq via ORAL
  Filled 2011-05-27: qty 2

## 2011-05-27 MED ORDER — DM-GUAIFENESIN ER 30-600 MG PO TB12: 1.0000 | ORAL_TABLET | Freq: Two times a day (BID) | ORAL | Status: AC

## 2011-05-27 MED ORDER — CEFUROXIME AXETIL 500 MG PO TABS: 500.0000 mg | ORAL_TABLET | Freq: Two times a day (BID) | ORAL | Status: AC

## 2011-05-27 MED ORDER — CEFUROXIME AXETIL 500 MG PO TABS
500.0000 mg | ORAL_TABLET | Freq: Two times a day (BID) | ORAL | Status: DC
  Filled 2011-05-27 (×3): qty 1

## 2011-05-27 MED ORDER — COLCHICINE 0.6 MG PO TABS: 0.6000 mg | ORAL_TABLET | Freq: Every day | ORAL | Status: DC | PRN

## 2011-05-27 MED ORDER — AZITHROMYCIN 500 MG PO TABS
500.0000 mg | ORAL_TABLET | Freq: Every day | ORAL | Status: DC
  Filled 2011-05-27: qty 1

## 2011-05-27 MED ORDER — AZITHROMYCIN 500 MG PO TABS: 500.0000 mg | ORAL_TABLET | Freq: Every day | ORAL | Status: AC

## 2011-05-27 NOTE — Telephone Encounter (Signed)
Patients wife called & would like to speak with you regarding her husband Pat# [phone] or call her cell [phone]  Thanks

## 2011-05-27 NOTE — Telephone Encounter (Signed)
Called x 2 line busy, will try again later

## 2011-05-27 NOTE — Discharge Summary (Signed)
Physician Discharge Summary  Patient ID: [name] MRN: [id number] DOB/AGE: 1940-05-01 71 y.o.  Admit date: 05/24/2011 Discharge date: 05/27/2011  Primary Care Physician:  [name], MD, MD  Discharge Diagnoses:    .DIABETES MELLITUS, CONTROLLED .Atrial fibrillation .Acute on chronic systolic heart failure: Improving  .CKD (chronic kidney disease), stage III .Hyperlipidemia .Hypertension .Community acquired pneumonia .Shortness of breath improving  Consults:  Cardiology, Dr. Myrtis Ser  Discharge Medications: Medication List  As of 05/27/2011  9:57 AM   STOP taking these medications         celecoxib 200 MG capsule      furosemide 40 MG tablet      indomethacin 25 MG capsule      losartan 50 MG tablet      metFORMIN 500 MG tablet         TAKE these medications         ACCU-CHEK SOFTCLIX LANCETS lancets   USE AS DIRECTED      amLODipine 5 MG tablet   Commonly known as: NORVASC   Take 1 tablet (5 mg total) by mouth daily.      aspirin 81 MG tablet   Take 81 mg by mouth once a week.      azithromycin 500 MG tablet   Commonly known as: ZITHROMAX   Take 1 tablet (500 mg total) by mouth daily.      carvedilol 25 MG tablet   Commonly known as: COREG   Take 25 mg by mouth 2 (two) times daily with a meal.      cefUROXime 500 MG tablet   Commonly known as: CEFTIN   Take 1 tablet (500 mg total) by mouth 2 (two) times daily with a meal.      cholecalciferol 1000 UNITS tablet   Commonly known as: VITAMIN D   Take 1,000 Units by mouth daily.      colchicine 0.6 MG tablet   Take 1 tablet (0.6 mg total) by mouth daily as needed. For gout      dextromethorphan-guaiFENesin 30-600 MG per 12 hr tablet   Commonly known as: MUCINEX DM   Take 1 tablet by mouth 2 (two) times daily.      gabapentin 100 MG capsule   Commonly known as: NEURONTIN   Take 100 mg by mouth 2 (two) times daily.      glimepiride 2 MG tablet   Commonly known as: AMARYL   Take 1 mg  by mouth daily before breakfast. 1/2 tab daily      glucose blood test strip   Use as instructed      ipratropium 0.06 % nasal spray   Commonly known as: ATROVENT   Place 1 spray into the nose daily.      methocarbamol 500 MG tablet   Commonly known as: ROBAXIN   Take 500 mg by mouth 2 (two) times daily.      nitroGLYCERIN 0.4 MG SL tablet   Commonly known as: NITROSTAT   Place 0.4 mg under the tongue every 5 (five) minutes as needed.      oxyCODONE-acetaminophen 7.5-325 MG per tablet   Commonly known as: PERCOCET   Take 1 tablet by mouth every 8 (eight) hours as needed. For pain      potassium chloride 20 MEQ packet   Commonly known as: KLOR-CON   Take 10 mEq by mouth 2 (two) times daily. 1/2 by mouth twice daily      simvastatin 40 MG tablet   Commonly  known as: ZOCOR   Take 1 tablet (40 mg total) by mouth at bedtime.      thiamine 100 MG tablet   Commonly known as: VITAMIN B-1   Take 100 mg by mouth daily.      warfarin 2.5 MG tablet   Commonly known as: COUMADIN   Take 2.5-5 mg by mouth daily. 2.5 mg daily except 5 mg on Wed and Sat             Brief H and P: For complete details please refer to admission H and P, but in brief patient is a 71 year old male with extensive medical history including hypertension, A. fib on Coumadin, mitral extremity DVT, diabetes, chronic systolic CHF EF 35% in 2011, dyslipidemia presented to [facility] with complaints of cough, congestion, generalized weakness for at least 4 days prior to admission. Patient was admitted for likely decompensated CHF versus community-acquired pneumonia  Hospital Course:   Shortness of breath/community acquired pneumonia: Patient was briefly admitted to ICU secondary to dyspnea and looking ill likely secondary to community-acquired pneumonia.  Chest x-ray was not clear for obvious pneumonia however patient did have acute on chronic renal failure and dehydration. His symptoms  significantly improved after antibiotics and gentle hydration. Patient received IV vancomycin and Zosyn empirically with nebs and oxygen supplementation. He had an allergic reaction to Avelox with a rash. Patient was discharged on the Zithromax and Ceftin for 7 days to complete the course. He should have a followup chest x-ray in 10 days to 2 weeks. chronic systolic heart failure /Cardiomyopathy S/P ICD: Patient was followed by Dr. Myrtis Ser, his primary cardiologist. He was on Lasix which has been held because of hypotension and acute on chronic renal failure. Baseline creatinine is 1.5. He will followup with Dr. Myrtis Ser within next 10 days to 2 weeks. 2-D echo was done on 05/25/2011 which showed EF of 50-55%. For now Lasix and losartan has been has been treated to worsening renal function. Hypotension:Improved after holding antihypertensives, sepsis is unlikely given normal procalcitonin level and lactic acid  Echo showed no pericardial effusion. TSH normal , random cortisol level was normal  DIABETES MELLITUS, CONTROLLED  A1c level 6.7%, patient was maintained on sliding scale insulin   Atrial fibrillation  Rate controlled, patient was continued on Coumadin per pharmacy protocol   CKD (chronic kidney disease), stage III  Baseline cr 1.5 ,rise in Cr noted during hospitalization, patient was placed on hold for Lasix, losartan, Celebrex, indomethacin and patient will follow up with his primary care physician Dr. Alwyn Ren early next week and have a repeat BMET checked. Patient was however explained to restart Lasix if he notes any shortness of breath and weight gain.  Pleural Thickening :Patient had some pleural thickening in 2011 with an effusion and a complete workup.     Day of Discharge BP 132/85  Pulse 75  Temp(Src) 97.9 F (36.6 C) (Oral)  Resp 18  Ht 6' (1.829 m)  Wt 110.768 kg (244 lb 3.2 oz)  BMI 33.12 kg/m2  SpO2 96%  Physical Exam: General: Alert and awake oriented x3 not in any acute  distress. HEENT: anicteric sclera, pupils reactive to light and accommodation CVS: S1-S2 clear no murmur rubs or gallops Chest: clear to auscultation bilaterally, no wheezing rales or rhonchi Abdomen: soft nontender, nondistended, normal bowel sounds, no organomegaly Extremities: no cyanosis, clubbing or edema noted bilaterally Neuro: Cranial nerves II-XII intact, no focal neurological deficits   The results of significant diagnostics from  this hospitalization (including imaging, microbiology, ancillary and laboratory) are listed below for reference.    LAB RESULTS: Basic Metabolic Panel:  Lab 05/27/11 1191 05/26/11 0953 05/24/11 2005  NA 141 138 --  K 3.2* 3.9 --  CL 100 96 --  CO2 30 27 --  GLUCOSE 133* 229* --  BUN 35* 47* --  CREATININE 1.69* 1.76* --  CALCIUM 9.0 8.9 --  MG -- -- 2.4  PHOS -- -- 5.3*   Liver Function Tests:  Lab 05/24/11 2005  AST 13  ALT 14  ALKPHOS 69  BILITOT 0.6  PROT 6.9  ALBUMIN 3.5   CBC:  Lab 05/27/11 0530 05/24/11 2005  WBC 8.1 7.9  NEUTROABS -- 5.8  HGB 16.0 17.2*  HCT 44.9 48.3  MCV 89.6 --  PLT 165 165   Cardiac Enzymes:  Lab 05/25/11 1117 05/25/11 0410  CKTOTAL 32 28  CKMB 1.6 1.8  CKMBINDEX -- --  TROPONINI <0.30 <0.30   BNP: No components found with this basename: POCBNP:2 CBG:  Lab 05/27/11 0718 05/26/11 1225  GLUCAP 122* 167*    Significant Diagnostic Studies:  Dg Chest 2 View  05/24/2011  *RADIOLOGY REPORT*  Clinical Data: 71 year old male with chest pain, cough, shortness of breath, weakness, unexplained weight loss.  CHEST - 2 VIEW  Comparison: 07/29/2010 and earlier.  Findings: Stable right chest cardiac AICD. Stable cardiomegaly and mediastinal contours.  Lower cervical ACDF hardware partially visible.  Chronic retrocardiac hypoventilation is stable. Occasional calcified pleural plaques again noted.  No pneumothorax, pulmonary edema or definite effusion. No acute osseous abnormality identified.  Partially  visible lumbar fusion hardware.  IMPRESSION: Stable cardiomegaly.  No acute cardiopulmonary abnormality.  Original Report Authenticated By: Harley Hallmark, M.D.     Disposition and Follow-up: Discharge Orders    Future Appointments: Provider: Department: Dept Phone: Center:   06/07/2011 11:45 AM [facility] [phone] None   06/07/2011 12:00 PM [name], MD [facility] [phone] [facility]   06/09/2011 11:25 AM [facility] [phone] [facility]     Future Orders Please Complete By Expires   Diet Carb Modified      Increase activity slowly      (HEART FAILURE PATIENTS) Call MD:  Anytime you have any of the following symptoms: 1) 3 pound weight gain in 24 hours or 5 pounds in 1 week 2) shortness of breath, with or without a dry hacking cough 3) swelling in the hands, feet or stomach 4) if you have to sleep on extra pillows at night in order to breathe.      Discharge instructions      Comments:   Please follow up with Dr. Alwyn Ren next week with in 7 days , you need BMET lab for kidney function and follow CXR (chest Xray).  Please HOLD lasix, losartan, celebrex, indomethacin, metformin until follow-up with Dr Alwyn Ren and BMET (renal panel) check in office       DISPOSITION: Home, PT evaluation was done and did not need any assistance  DIET: Carb modify diet low-sodium ACTIVITY: As tolerated  TESTS THAT NEED FOLLOW-UP BMET and followup chest x-ray by PCP  DISCHARGE FOLLOW-UP Follow-up Information    Follow up with [name], MD. Schedule an appointment as soon as possible for a visit in 7 days. (for hospital f/u, you need BMET (renal panel) and follow CXR (chest xray))       Follow up with [name], MD. Schedule an appointment as  soon as possible for a visit in 10 days. (for hospital follow up)    Contact information:   [address], [address]  [place] [address] [phone]          Time spent on Discharge: 45 minutes  Signed:  Shykeem Resurreccion M.D. [facility] 05/27/2011, 9:57 AM

## 2011-05-27 NOTE — Telephone Encounter (Signed)
Patients wife returned your call. Requests call back before end of day today. Please call home # and if busy call mobile # listed below. Patient has f/u scheduled with dr hopper for 3-14 @ 10 per staff message from dr hopper.

## 2011-05-27 NOTE — Telephone Encounter (Signed)
Spoke with patient's wife, patient's wife called to show her appreciation for recommending them to be seen yesterday at [facility] med center. Patient's wife would like to know if patient can be seen sooner that Thursday. Patient would like for me to speak with Dr.Hopper when he returns on Monday

## 2011-05-30 ENCOUNTER — Encounter: Payer: Self-pay | Admitting: Internal Medicine

## 2011-05-30 ENCOUNTER — Ambulatory Visit (HOSPITAL_BASED_OUTPATIENT_CLINIC_OR_DEPARTMENT_OTHER)
Admission: RE | Admit: 2011-05-30 | Discharge: 2011-05-30 | Disposition: A | Discharge status: Home / Self Care | Payer: Medicare Other | Source: Ambulatory Visit | Attending: Internal Medicine | Admitting: Internal Medicine

## 2011-05-30 ENCOUNTER — Ambulatory Visit (INDEPENDENT_AMBULATORY_CARE_PROVIDER_SITE_OTHER): Payer: Medicare Other | Admitting: Internal Medicine

## 2011-05-30 DIAGNOSIS — E119 Type 2 diabetes mellitus without complications: Secondary | ICD-10-CM

## 2011-05-30 DIAGNOSIS — Z79899 Other long term (current) drug therapy: Secondary | ICD-10-CM

## 2011-05-30 DIAGNOSIS — I10 Essential (primary) hypertension: Secondary | ICD-10-CM

## 2011-05-30 DIAGNOSIS — E876 Hypokalemia: Secondary | ICD-10-CM

## 2011-05-30 DIAGNOSIS — J90 Pleural effusion, not elsewhere classified: Secondary | ICD-10-CM | POA: Insufficient documentation

## 2011-05-30 DIAGNOSIS — I517 Cardiomegaly: Secondary | ICD-10-CM | POA: Insufficient documentation

## 2011-05-30 DIAGNOSIS — Z7901 Long term (current) use of anticoagulants: Secondary | ICD-10-CM

## 2011-05-30 DIAGNOSIS — R0602 Shortness of breath: Secondary | ICD-10-CM

## 2011-05-30 LAB — CULTURE, BLOOD (ROUTINE X 2): Culture: NO GROWTH

## 2011-05-30 NOTE — Telephone Encounter (Signed)
Spoke with Dr.Hopper, ok to double-book patient today. Patient to be seen at 3pm, patient's wife is aware there will be a short wait

## 2011-05-30 NOTE — Assessment & Plan Note (Signed)
Shortness of breath has improved; chest x-ray will be repeated as initial film did not show pneumonia by report which was reviewed.

## 2011-05-30 NOTE — Assessment & Plan Note (Addendum)
Blood pressure is upper limits of normal to mildly elevated; losartan 50 mg one day will be reinitiated.  He did exhibit mild hypokalemia at the time of discharge.  Because of the recurring edema; his furosemide will be restarted 40 mg twice a day.

## 2011-05-30 NOTE — Assessment & Plan Note (Signed)
Because of the renal insufficiency; metformin will be held and renal function and A1c checked in 6 weeks.

## 2011-05-30 NOTE — Progress Notes (Signed)
  Subjective:    Patient ID: [name], male    DOB: 01/26/1941, 71 y.o.   MRN: [id number]  HPI He was hospitalized 3/5-05/27/11 with a presumptive diagnosis of pneumonia. Admission he was shortness of breath and did have a purulent sputum. Chest x-ray 3/5 revealed stable cardiomegaly but no infiltrates. White blood count was normal. His PT/INR was elevated 3/7 to 3.19; his last PT/INR was 3.02 on 3/8. He was discharged on azithromycin and cefuroxime. In the hospital metformin, furosemide, losartan, celebrex and indomethacin were discontinued. Renal function improved from  2.12 on 3/5 to a value of 1.69 on 3/8. Potassium was 3.2 on that date. During the week prior to hospitalization; he lost 15 pounds. Since discharge he has improved from 244 up to a value of 250.        Review of Systems    At this time he denies fever, chills, sweats or purulence sputum. The sputum is essentially clear. Edema is progressing off the furosemide.     Objective:   Physical Exam Gen.: well-nourished in appearance. Alert, appropriate and cooperative throughout exam. In no distress Eyes: No corneal or conjunctival inflammation noted. Ears: External  ear exam reveals no significant lesions or deformities. Canals clear .TMs normal. Hearing is grossly decreased bilaterally. Nose: External nasal exam reveals no deformity or inflammation. Nasal mucosa are pink and moist. No lesions or exudates noted.   Mouth: Oral mucosa and oropharynx reveal no lesions or exudates. Dentures  Lungs: Normal respiratory effort; chest expands symmetrically. Lungs are clear to auscultation without rales, wheezes, or increased work of breathing. Decreased BS Heart: Normal rate and rhythm. Normal S1 and S2. No gallop, click, or rub. No murmur; heart sounds distant. Abdomen: Bowel sounds normal; abdomen soft and nontender. No masses, organomegaly . Ventral hernia noted.                                                        Musculoskeletal/extremities: No clubbing, cyanosis,  noted. Traumatic changes R palm .Toenails thickened.1+ edema LLE; trace RLE Vascular: Carotid, radial artery, dorsalis pedis and  posterior tibial pulses are full and equal. No bruits present. Neurologic: Alert and oriented x3.          Skin: Intact without suspicious lesions or rashes. Lymph: No cervical, axillary lymphadenopathy present. Psych: Mood and affect are normal. Normally interactive                                                                                         Assessment & Plan:

## 2011-05-30 NOTE — Assessment & Plan Note (Signed)
As noted PT/INR was elevated in the hospital. He was discharged on azithromycin which may elevate the PT/INR

## 2011-05-30 NOTE — Patient Instructions (Addendum)
Order for x-rays entered into  the computer; these will be performed at [facility]. No appointment is necessary.   Continue to hold the metformin. Restart losartan 50 mg once daily and furosemide 40 mg twice a day until seen by Dr. Myrtis Ser. PT/INR is low  therapeutic. No change in warfarin dose; repeat PT/INR in 4 weeks   Please  schedule  Labs in 6 weeks : BMET,A1c. PLEASE BRING THESE INSTRUCTIONS TO FOLLOW UP  LAB APPOINTMENT.This will guarantee correct labs are drawn, eliminating need for repeat blood sampling ( needle sticks ! ). Diagnoses /Codes: 401.9,250.00

## 2011-05-31 LAB — CULTURE, BLOOD (ROUTINE X 2)
Culture  Setup Time: 201303060245
Culture: NO GROWTH

## 2011-05-31 LAB — BASIC METABOLIC PANEL
BUN: 20 mg/dL (ref 6–23)
Chloride: 107 mEq/L (ref 96–112)
Creatinine, Ser: 1.1 mg/dL (ref 0.4–1.5)
Glucose, Bld: 92 mg/dL (ref 70–99)
Potassium: 4.1 mEq/L (ref 3.5–5.1)

## 2011-05-31 NOTE — Progress Notes (Signed)
Addended by: Maurice Small on: 05/31/2011 08:53 AM   Modules accepted: Orders

## 2011-06-02 ENCOUNTER — Telehealth: Payer: Self-pay | Admitting: Cardiology

## 2011-06-02 ENCOUNTER — Ambulatory Visit: Payer: Medicare Other | Admitting: Internal Medicine

## 2011-06-02 NOTE — Telephone Encounter (Signed)
Left message to call back  

## 2011-06-02 NOTE — Telephone Encounter (Signed)
1)  This morning patient blood pressure was 173/123 and after he took his meds down to 107/75.  PCP did have him start back on his Losartan at 1 a day and he took 1 yesterday.  Wife thinks blood pressure too high this morning when he got up so she is going to have him go back to twice a day (wanted to let us know).  Blood pressure had been low in the hospital and that's why these were discontinued.  Did schedule an appointment with Lorin Picket PA next week for follow up.  Advised to bring blood pressure cuff when they come  2)  Wife does want patient seen as soon as Dr Myrtis Ser can see him.  She is aware he is out of office but advised would forward to [facility] LPN so she can try to get something set up

## 2011-06-02 NOTE — Telephone Encounter (Signed)
New Msg: Pt wife calling wanting to speak with nurse regarding MD being out of the office and pt needing to rs appt. Soonest available appt with Dr. Myrtis Ser is 07/20/11 and pt wife doesn't know if pt can wait that long to see MD. Please return pt wife call to discuss further.

## 2011-06-02 NOTE — Telephone Encounter (Signed)
Fu call °Patient returning your call °

## 2011-06-03 ENCOUNTER — Telehealth: Payer: Self-pay

## 2011-06-03 NOTE — Telephone Encounter (Signed)
Message copied by Maurice Small on Fri Jun 03, 2011  5:46 PM ------      Message from: [name]      Created: Tue May 31, 2011  5:20 PM       Your chemistries are normal. Alfonse Flavors

## 2011-06-03 NOTE — Telephone Encounter (Signed)
Spoke with patient's wife yesterday and informed her of results. Copies to be mailed per patient's wife request

## 2011-06-06 ENCOUNTER — Encounter: Payer: Self-pay | Admitting: Internal Medicine

## 2011-06-07 ENCOUNTER — Encounter: Payer: Self-pay | Admitting: *Deleted

## 2011-06-07 ENCOUNTER — Ambulatory Visit: Payer: Medicare Other | Admitting: Cardiology

## 2011-06-07 ENCOUNTER — Telehealth: Payer: Self-pay | Admitting: *Deleted

## 2011-06-07 ENCOUNTER — Ambulatory Visit (INDEPENDENT_AMBULATORY_CARE_PROVIDER_SITE_OTHER): Payer: Medicare Other | Admitting: Physician Assistant

## 2011-06-07 ENCOUNTER — Encounter: Payer: Self-pay | Admitting: Physician Assistant

## 2011-06-07 ENCOUNTER — Ambulatory Visit (INDEPENDENT_AMBULATORY_CARE_PROVIDER_SITE_OTHER): Payer: Medicare Other | Admitting: *Deleted

## 2011-06-07 VITALS — BP 122/72 | HR 80 | Ht 72.0 in | Wt 255.0 lb

## 2011-06-07 DIAGNOSIS — I4891 Unspecified atrial fibrillation: Secondary | ICD-10-CM

## 2011-06-07 DIAGNOSIS — I4892 Unspecified atrial flutter: Secondary | ICD-10-CM

## 2011-06-07 DIAGNOSIS — J90 Pleural effusion, not elsewhere classified: Secondary | ICD-10-CM

## 2011-06-07 DIAGNOSIS — I509 Heart failure, unspecified: Secondary | ICD-10-CM

## 2011-06-07 DIAGNOSIS — Z7901 Long term (current) use of anticoagulants: Secondary | ICD-10-CM

## 2011-06-07 DIAGNOSIS — I2510 Atherosclerotic heart disease of native coronary artery without angina pectoris: Secondary | ICD-10-CM

## 2011-06-07 DIAGNOSIS — I10 Essential (primary) hypertension: Secondary | ICD-10-CM

## 2011-06-07 LAB — BASIC METABOLIC PANEL
BUN: 24 mg/dL — ABNORMAL HIGH (ref 6–23)
Chloride: 107 mEq/L (ref 96–112)
Glucose, Bld: 81 mg/dL (ref 70–99)
Potassium: 3.7 mEq/L (ref 3.5–5.1)

## 2011-06-07 NOTE — Patient Instructions (Addendum)
Your physician recommends that you schedule a follow-up appointment in: 6 WEEKS WITH DR. KATZ  Your physician recommends that you return for lab work in: TODAY BMET  401.9

## 2011-06-07 NOTE — Telephone Encounter (Signed)
Message copied by Tarri Fuller on Tue Jun 07, 2011  5:35 PM ------      Message from: [place], [place] T      Created: Tue Jun 07, 2011  5:31 PM       Creatinine stable      Tereso Newcomer, [place]  5:31 PM 06/07/2011

## 2011-06-07 NOTE — Progress Notes (Signed)
[address] [place], [place]  [address] Phone: [phone] Fax:  [phone]  Date:  06/07/2011   Name:  [name]       DOB:  1940/04/17 MRN:  [id number]  PCP:  Dr. Alwyn Ren Primary Cardiologist:  Dr. Zackery Barefoot  Primary Electrophysiologist:  Dr. [name]    History of Present Illness: [name] is a 71 y.o. male who presents for follow up on BP.  He has a history of multiple cardiac problems.  This includes ischemic cardiomyopathy, systolic heart failure, permanent atrial fibrillation, status post AICD, CAD, status post CABG.  Last LHC 7/08: SVG-OM patent, SVG-PDA patent, LIMA-LAD patent.  Patient had unexplained pericardial effusion 2011 that resolved on its own.  Also has a history of left effusion that was noted to be an exudate in 2011.  Other history includes AAA, status post repair, gout, renal insufficiency, spinal stenosis.  He was admitted earlier this month with probable pneumonia.  He was seen by Dr. Myrtis Ser at that time.  A follow up echo was done 05/25/11: Mild LVH, EF 50-55%, moderate LAE.  This was a limited study.  The patient has had variable ejection fractions over the past.  It was not felt that the patient was suffering from congestive heart failure.  He was hypotensive while hospitalized and was hydrated given a higher than normal creatinine.  Some of his blood pressure medicines were also held.  Dr. Myrtis Ser noted that the patient has had quite unusual blood pressures as an outpatient in the past.  He typically runs very high in the morning but will drop to normal or even low after taking his medications.  Multiple combinations have been attempted in the past.  There was no sign of pleural effusion on chest x-ray in the hospital.  He followed up with his PCP recently for posthospitalization follow up.  Chest x-ray in follow up on 05/30/11: Chronic left effusion, pleural plaques consistent with prior asbestos exposure noted.   He was placed back on ARB and Lasix at his visit with his PCP.  He called in last week with concerns over his blood pressure.  It was 173/123 prior to taking his medications and dropped down to 107/75 after taking his medications.    He has gone back on Losartan BID since calling in.  Now his BPs are back to baseline.  No complaints.  DOE is stable.  No orthopnea, PND or edema.  No chest pain.  Cough almost resolved.  Weights stable.  No syncope.    Past Medical History  Diagnosis Date  . Diabetes mellitus   . Hyperlipidemia   . Hypertension   . Pilonidal cyst   . Atrial fibrillation     Previous long-term amiodarone therapy with multiple cardioversions / amiodarone stopped September, 2009  . Atrial flutter     Started November, 2010, Left-sided and cannot ablate  . Left atrial thrombus     Remote past... cardioversions done since that time  . Wide-complex tachycardia   . Left ventricular ejection fraction less than 40%   . Gout   . AAA (abdominal aortic aneurysm)     Surgical repair  . Discolored skin   . S/P ICD (internal cardiac defibrillator) procedure     Dr. Ladona Ridgel 2009... by the pacing  . SOB (shortness of breath)     Large left effusion/ thoracentesis/hospitalization/November, 2011... exudated.. cytology negative.. Dr.Wert.. no proof of mesothelioma  . Pericardial effusion   . Pleural  effusion     Large loculated effusion on the left side November, 2011. This was tapped. It was exudative. Cytology revealed no cancer no proof of mesothelioma area pulmonary team felt that no further workup was needed  . S/P AAA repair   . Spinal stenosis     Surgery Dr.Elsner  . CAD (coronary artery disease)     Catheterization July, 2008... name and vein grafts patent but low cardiac output  . Warfarin anticoagulation   . Cardiomyopathy     Ischemic... ICD  . CHF (congestive heart failure)     EF 30-40%... echo.. November, 2008  /  EF 55-60% echo... November, 2011  . Venous insufficiency      Toe discoloration chronic  . Mitral regurgitation     Mild echo  . Aortic valve sclerosis   . Nasal drainage     Chronic  . Alcohol ingestion of more than four drinks per week     Excess beer  not a dependency problem  . Chronotropic incompetence     IV pacing rate adjusted  . Thrombophlebitis of superficial veins of upper extremities     Possible venous stenosis from defibrillator  . Pericardial effusion     November, 2011 .. decreased during hospitalization  . Eye abnormality     Ophthalmologist questions a clot in one of his eyes, May, 2012  . Overweight     November, 2012  . Pleural thickening   . Ejection fraction < 50%     Ejection fraction has varied over time from 35-50%.,, Echoes are technically very difficult,,, EF 50%, echo, May 25, 2011, technically very difficult  . Drug therapy     Redness and swelling with Avelox infusion May 24, 2011    Current Outpatient Prescriptions  Medication Sig Dispense Refill  . ACCU-CHEK SOFTCLIX LANCETS lancets USE AS DIRECTED  100 each  4  . amLODipine (NORVASC) 5 MG tablet Take 1 tablet (5 mg total) by mouth daily.  90 tablet  3  . aspirin 81 MG tablet Take 81 mg by mouth once a week.       . carvedilol (COREG) 25 MG tablet Take 25 mg by mouth 2 (two) times daily with a meal.       . CELEBREX 200 MG capsule Take 200 mg by mouth 2 (two) times daily.       . cholecalciferol (VITAMIN D) 1000 UNITS tablet Take 1,000 Units by mouth daily.      . colchicine 0.6 MG tablet Take 1 tablet (0.6 mg total) by mouth daily as needed. For gout  30 tablet  1  . furosemide (LASIX) 40 MG tablet Take 40 mg by mouth 2 (two) times daily.       Marland Kitchen gabapentin (NEURONTIN) 100 MG capsule Take 100 mg by mouth 2 (two) times daily.      Marland Kitchen glimepiride (AMARYL) 2 MG tablet Take 1 mg by mouth daily before breakfast.       . glucose blood (ACCU-CHEK COMFORT CURVE) test strip Use as instructed  100 each  4  . ipratropium (ATROVENT) 0.06 % nasal spray Place 1  spray into the nose daily.       Marland Kitchen losartan (COZAAR) 50 MG tablet Take 50 mg by mouth 2 (two) times daily.      . metFORMIN (GLUCOPHAGE) 500 MG tablet Take 500 mg by mouth 2 (two) times daily with a meal.      . methocarbamol (ROBAXIN) 500 MG tablet Take 500  mg by mouth 2 (two) times daily.       . nitroGLYCERIN (NITROSTAT) 0.4 MG SL tablet Place 0.4 mg under the tongue every 5 (five) minutes as needed.        Marland Kitchen oxyCODONE-acetaminophen (PERCOCET) 7.5-325 MG per tablet Take 1 tablet by mouth every 8 (eight) hours as needed. For pain      . potassium chloride (KLOR-CON) 20 MEQ packet Take 10 mEq by mouth 2 (two) times daily.       . simvastatin (ZOCOR) 40 MG tablet Take 1 tablet (40 mg total) by mouth at bedtime.  90 tablet  1  . thiamine (VITAMIN B-1) 100 MG tablet Take 100 mg by mouth daily.      Marland Kitchen warfarin (COUMADIN) 2.5 MG tablet Take 2.5-5 mg by mouth daily. 2.5 mg daily except 5 mg on Wed and Sat      . cefUROXime (CEFTIN) 500 MG tablet Take 1 tablet (500 mg total) by mouth 2 (two) times daily with a meal.  14 tablet  0  . dextromethorphan-guaiFENesin (MUCINEX DM) 30-600 MG per 12 hr tablet Take 1 tablet by mouth 2 (two) times daily.  30 tablet  0    Allergies: Allergies  Allergen Reactions  . Avelox (Moxifloxacin Hcl In Nacl) Swelling, Rash and Other (See Comments)    Patient became hypotensive after infusion started  . Penicillins     REACTION: anaphylaxis    History  Substance Use Topics  . Smoking status: Former Smoker    Quit date: 03/21/1990  . Smokeless tobacco: Former Neurosurgeon  . Alcohol Use: 0.0 oz/week    21-28 Cans of beer per week     beer     ROS:  Please see the history of present illness.   All other systems reviewed and negative.   PHYSICAL EXAM: VS:  BP 122/72  Pulse 80  Ht 6' (1.829 m)  Wt 255 lb (115.667 kg)  BMI 34.58 kg/m2 Well nourished, well developed, in no acute distress HEENT: normal Neck: no JVD Cardiac:  normal S1, S2; irreg, irreg; no  murmur Lungs:  Decreased breath sounds left base with egophony 2/3 up the chest, clear on right Abd: soft, nontender, no hepatomegaly Ext: no edema Skin: warm and dry Neuro:  CNs 2-12 intact, no focal abnormalities noted  EKG:  Afib, V paced, HR 80  ASSESSMENT AND PLAN:  1. Hypertension  Better controlled.  Continue current regimen.  Check BMET now that he is back on Lasix and full dose ARB.  Follow up with Dr. Zackery Barefoot as scheduled.   2. CAD (coronary artery disease)  No angina.  Continue ASA and statin.  Follow up with Dr. Zackery Barefoot as scheduled.   3. Atrial fibrillation  Continue coumadin.  Rate controlled.    4. Chronic Systolic CHF (congestive heart failure) Volume appears stable.  Continue current regimen.  Check BMET today.    5. Pleural effusion  Appears chronic.  I reviewed prior CXRs and appears this is unchanged.  Follow up with Dr. Zackery Barefoot as scheduled.       Signed, Tereso Newcomer, PA-C  3:09 PM 06/07/2011

## 2011-06-07 NOTE — Telephone Encounter (Signed)
s/w pt's wife who is aware of lab results and asked for results letter be mailed to them. Danielle Rankin

## 2011-06-09 ENCOUNTER — Encounter: Payer: Self-pay | Admitting: Internal Medicine

## 2011-06-09 ENCOUNTER — Ambulatory Visit (INDEPENDENT_AMBULATORY_CARE_PROVIDER_SITE_OTHER): Payer: Medicare Other | Admitting: *Deleted

## 2011-06-09 DIAGNOSIS — I428 Other cardiomyopathies: Secondary | ICD-10-CM

## 2011-06-09 DIAGNOSIS — I472 Ventricular tachycardia: Secondary | ICD-10-CM

## 2011-06-09 DIAGNOSIS — I5023 Acute on chronic systolic (congestive) heart failure: Secondary | ICD-10-CM

## 2011-06-14 LAB — REMOTE ICD DEVICE
AL AMPLITUDE: 0.8 mv
ATRIAL PACING ICD: 0 pct
FVT: 0
LV LEAD THRESHOLD: 2.75 V
PACEART VT: 0
TZAT-0001ATACH: 1
TZAT-0002ATACH: NEGATIVE
TZAT-0002ATACH: NEGATIVE
TZAT-0004FASTVT: 8
TZAT-0011SLOWVT: 10 ms
TZAT-0011SLOWVT: 10 ms
TZAT-0012ATACH: 150 ms
TZAT-0012FASTVT: 200 ms
TZAT-0012SLOWVT: 200 ms
TZAT-0012SLOWVT: 200 ms
TZAT-0013FASTVT: 1
TZAT-0018ATACH: NEGATIVE
TZAT-0018SLOWVT: NEGATIVE
TZAT-0018SLOWVT: NEGATIVE
TZAT-0019ATACH: 6 V
TZAT-0019ATACH: 6 V
TZAT-0019ATACH: 6 V
TZAT-0019SLOWVT: 8 V
TZAT-0019SLOWVT: 8 V
TZAT-0020ATACH: 1.5 ms
TZAT-0020ATACH: 1.5 ms
TZAT-0020FASTVT: 1.5 ms
TZON-0003SLOWVT: 340 ms
TZON-0004SLOWVT: 24
TZON-0005SLOWVT: 12
TZST-0001ATACH: 4
TZST-0001ATACH: 5
TZST-0001FASTVT: 4
TZST-0001FASTVT: 5
TZST-0001SLOWVT: 4
TZST-0001SLOWVT: 6
TZST-0002ATACH: NEGATIVE
TZST-0002ATACH: NEGATIVE
TZST-0003FASTVT: 35 J
TZST-0003FASTVT: 35 J
TZST-0003SLOWVT: 15 J
TZST-0003SLOWVT: 35 J
VENTRICULAR PACING ICD: 65.82 pct
VF: 0

## 2011-06-21 NOTE — Progress Notes (Signed)
ICD remote with ICM 

## 2011-06-28 ENCOUNTER — Other Ambulatory Visit: Payer: Self-pay | Admitting: Cardiology

## 2011-06-29 ENCOUNTER — Encounter: Payer: Self-pay | Admitting: *Deleted

## 2011-07-05 NOTE — Telephone Encounter (Signed)
I have noted this. The patient has a followup visit arranged

## 2011-07-06 ENCOUNTER — Telehealth: Payer: Self-pay | Admitting: Internal Medicine

## 2011-07-06 NOTE — Telephone Encounter (Signed)
Patient wife request return call from nurse [facility], she can be reached at [phone]. She would not give reason for call.

## 2011-07-06 NOTE — Telephone Encounter (Signed)
Spoke with patient's wife and let her know that Dr Ladona Ridgel said it will most likely be okay but he does not know much about these devices  He is to see Dr Ollen Bowl on Tues to discuss neurotransmitter

## 2011-07-08 ENCOUNTER — Telehealth: Payer: Self-pay

## 2011-07-08 ENCOUNTER — Other Ambulatory Visit: Payer: Self-pay | Admitting: Internal Medicine

## 2011-07-08 DIAGNOSIS — E119 Type 2 diabetes mellitus without complications: Secondary | ICD-10-CM

## 2011-07-08 DIAGNOSIS — I10 Essential (primary) hypertension: Secondary | ICD-10-CM

## 2011-07-08 MED ORDER — DOXYCYCLINE HYCLATE 100 MG PO CPEP: 100.0000 mg | ORAL_CAPSULE | Freq: Two times a day (BID) | ORAL | Status: DC

## 2011-07-08 NOTE — Telephone Encounter (Signed)
Message copied by Maurice Small on Fri Jul 08, 2011  4:28 PM ------      Message from: [facility], [place] R      Created: Fri Jul 08, 2011 10:42 AM      Contact: Spouse - Awais       Please call says she has questions for you and would not tell me anything      Home phone [phone]      Cell [phone]

## 2011-07-08 NOTE — Telephone Encounter (Signed)
Spoke with patient, Right Calf is swollen, slightly red, and a little painful, ? Cellulitis again. Patient would like to see Dr.Hopper on Monday.  Patient 's wife informed patient to be seen at Urgent care or the emergency room if any change in symptoms. Patient's wife also informed how to go about being seen at [facility] if needed.  Per Dr.Hopper Doxycycline 100 mg BID #10, If Fever ER   Patient's wife will pick up rx today for patient to start

## 2011-07-11 ENCOUNTER — Other Ambulatory Visit (INDEPENDENT_AMBULATORY_CARE_PROVIDER_SITE_OTHER): Payer: Medicare Other

## 2011-07-11 ENCOUNTER — Encounter: Payer: Self-pay | Admitting: Internal Medicine

## 2011-07-11 ENCOUNTER — Ambulatory Visit (INDEPENDENT_AMBULATORY_CARE_PROVIDER_SITE_OTHER): Payer: Medicare Other | Admitting: Internal Medicine

## 2011-07-11 VITALS — BP 118/70 | HR 83 | Temp 97.4°F | Wt 261.0 lb

## 2011-07-11 DIAGNOSIS — Z7901 Long term (current) use of anticoagulants: Secondary | ICD-10-CM

## 2011-07-11 DIAGNOSIS — E119 Type 2 diabetes mellitus without complications: Secondary | ICD-10-CM

## 2011-07-11 DIAGNOSIS — L0390 Cellulitis, unspecified: Secondary | ICD-10-CM

## 2011-07-11 DIAGNOSIS — I4891 Unspecified atrial fibrillation: Secondary | ICD-10-CM

## 2011-07-11 DIAGNOSIS — N289 Disorder of kidney and ureter, unspecified: Secondary | ICD-10-CM

## 2011-07-11 DIAGNOSIS — IMO0002 Reserved for concepts with insufficient information to code with codable children: Secondary | ICD-10-CM

## 2011-07-11 DIAGNOSIS — L0291 Cutaneous abscess, unspecified: Secondary | ICD-10-CM

## 2011-07-11 DIAGNOSIS — I10 Essential (primary) hypertension: Secondary | ICD-10-CM

## 2011-07-11 LAB — BASIC METABOLIC PANEL
BUN: 26 mg/dL — ABNORMAL HIGH (ref 6–23)
CO2: 25 mEq/L (ref 19–32)
Chloride: 107 mEq/L (ref 96–112)
Creatinine, Ser: 1.4 mg/dL (ref 0.4–1.5)
Potassium: 3.7 mEq/L (ref 3.5–5.1)

## 2011-07-11 LAB — HEMOGLOBIN A1C: Hgb A1c MFr Bld: 7.1 % — ABNORMAL HIGH (ref 4.6–6.5)

## 2011-07-11 MED ORDER — MUPIROCIN 2 % EX OINT: TOPICAL_OINTMENT | CUTANEOUS | Status: AC

## 2011-07-11 MED ORDER — DOXYCYCLINE HYCLATE 100 MG PO CPEP: 100.0000 mg | ORAL_CAPSULE | Freq: Two times a day (BID) | ORAL | Status: DC

## 2011-07-11 NOTE — Assessment & Plan Note (Signed)
Creatinine 1.4; therefore will  continue to hold metformin. A1c goal will be a percent and blood pressure goal less than 140/90 on average; ideally < 130/85

## 2011-07-11 NOTE — Assessment & Plan Note (Signed)
A1c goal will be 8% in view of his age and comorbidities including renal insufficiency. Metformin will be held

## 2011-07-11 NOTE — Progress Notes (Signed)
  Subjective:    Patient ID: [name], male    DOB: 11/23/1940, 71 y.o.   MRN: [id number]  HPI  He had mild trauma to the right shin followed by some weeping from the abrasions last week. As of 07/08/11 he began to have erythema of the anterior shin. He was started on doxycycline by phone.  He denies fever, chills, sweats, or purulent discharge.He has been using Neosporin over the abrasions on the right shin  His PT/INR was 3.7 this morning on warfarin 2.5 mg daily except 5 mg (2 pills) Wednesdays and Saturday.  He describes some pressure sores in the right calf.      Review of Systems  He had some bruising  following his PT/INR draw today. He denies epistaxis, hemoptysis, hematuria, rectal bleeding, or melena.   Off metformin his A1c is 7.1%. Creatinine is 1.4 GFR 52.35.  He wears support hose for his chronic edema in the right leg     Objective:   Physical Exam He is in no acute distress;he  appears adequately nourished.  He has no lymphadenopathy about the neck or axilla  Rhythm is irregular/irregular  Chest is clear with no increased work of breathing  Homan's sign  negative at the right calf.  There is edema of the right foot and ankle.  There is faint erythema over the right anterior shin without increased temperature.  He has  healing abrasions over the upper right shin.  Pedal pulses are decreased bilaterally  He has chronic toenail changes bilaterally        Assessment & Plan:  #1 minimal cellulitis right shin in the context of chronic edema, recurrent trauma and decreased perfusion  #2 excess anticoagulation as expected on the doxycycline. His warfarin will be changed as follows: No warfarin today 4/22 then resume present schedule except for 2.5 mg every day except 5 mg Saturday. PT/INR on 5/6

## 2011-07-11 NOTE — Progress Notes (Signed)
Addended by: Maurice Small on: 07/11/2011 04:19 PM   Modules accepted: Orders

## 2011-07-11 NOTE — Patient Instructions (Addendum)
Dip gauze in  sterile saline and applied to the wound twice a day. Cover the wound with Telfa , non stick dressing  without any antibiotic ointment. The saline can be purchased at the drugstore or you can make your own .Boil cup of salt in a gallon of water. Store mixture  in a clean container.Report Warning  signs as discussed (red streaks, pus, fever, increasing pain).   Stop Neosporin; apply the generic Bactroban after soaking  Once the skin lesions have healed totally; wear a shin guard when up and ambulating. Safety proof the house.  No Coumadin today then 2.5 mg every day except 5 mg on Saturday. Repeat PT/INR as scheduled 07/25/11.    Eat a low-fat diet with lots of fruits and vegetables, up to 7-9 servings per day. Consume less than 40( preferably ZERO) grams of sugar per day from foods & drinks with High Fructose Corn Syrup (HFCS) sugar as #1,2,3 or # 4 on label.Whole Foods, [employer] & [employer] do not carry products with HFCS. Follow a  low carb nutrition program such as [place] or [employer]  to prevent Diabetes progression . White carbohydrates (potatoes, rice, bread, and pasta) have a high spike of sugar and a high load of sugar. For example a  baked potato has a cup of sugar and a  french fry  2 teaspoons of sugar. Yams, wild  rice, whole grained bread &  wheat pasta have been much lower spike and load of  sugar. Portions should be the size of a deck of cards or your palm.

## 2011-07-12 ENCOUNTER — Telehealth: Payer: Self-pay | Admitting: Internal Medicine

## 2011-07-12 NOTE — Telephone Encounter (Signed)
I called [facility] pharmacy and spoke with mark and clarified Dr.Hopper would like rx for doxycycline. He stated it came over incorrectly but he will dispense doxy.

## 2011-07-12 NOTE — Telephone Encounter (Signed)
Fax received from [facility] regarding doxycycline 100MG  DR Capsule requesting Script for Vibramyacin Can send E-scribe please [facility] fax [phone]

## 2011-07-22 ENCOUNTER — Encounter: Payer: Self-pay | Admitting: Cardiology

## 2011-07-24 ENCOUNTER — Other Ambulatory Visit: Payer: Self-pay | Admitting: Internal Medicine

## 2011-07-25 ENCOUNTER — Encounter: Payer: Self-pay | Admitting: Cardiology

## 2011-07-25 ENCOUNTER — Ambulatory Visit (INDEPENDENT_AMBULATORY_CARE_PROVIDER_SITE_OTHER): Payer: Medicare Other | Admitting: Cardiology

## 2011-07-25 ENCOUNTER — Ambulatory Visit (INDEPENDENT_AMBULATORY_CARE_PROVIDER_SITE_OTHER): Payer: Medicare Other | Admitting: *Deleted

## 2011-07-25 VITALS — BP 132/88 | HR 69 | Ht 72.0 in | Wt 266.6 lb

## 2011-07-25 DIAGNOSIS — I509 Heart failure, unspecified: Secondary | ICD-10-CM

## 2011-07-25 DIAGNOSIS — I872 Venous insufficiency (chronic) (peripheral): Secondary | ICD-10-CM

## 2011-07-25 DIAGNOSIS — I10 Essential (primary) hypertension: Secondary | ICD-10-CM

## 2011-07-25 DIAGNOSIS — I4891 Unspecified atrial fibrillation: Secondary | ICD-10-CM

## 2011-07-25 DIAGNOSIS — M4800 Spinal stenosis, site unspecified: Secondary | ICD-10-CM

## 2011-07-25 DIAGNOSIS — Z7901 Long term (current) use of anticoagulants: Secondary | ICD-10-CM

## 2011-07-25 MED ORDER — FUROSEMIDE 40 MG PO TABS: 40.0000 mg | ORAL_TABLET | Freq: Two times a day (BID) | ORAL | Status: DC

## 2011-07-25 MED ORDER — CARVEDILOL 25 MG PO TABS: ORAL_TABLET | ORAL | Status: DC

## 2011-07-25 MED ORDER — WARFARIN SODIUM 2.5 MG PO TABS: ORAL_TABLET | ORAL | Status: DC

## 2011-07-25 NOTE — Assessment & Plan Note (Signed)
His blood pressure is very difficult to treat completely. He tends to have high pressures when he first gets up in the morning and then later in the day he can have lower pressures and even borderline hypotension. I've chosen not to change his medicines as of today.

## 2011-07-25 NOTE — Progress Notes (Signed)
HPI  Patient seen for cardiology followup. His overall status is quite complex. Most recently he was hospitalized with pneumonia. In addition since then he's had some recurrent cellulitis in his right lower leg. In the past he had an injury to that leg. He does have some venous insufficiency. He has unilateral swelling. I doubt there is evidence of DVT.  Allergies  Allergen Reactions  . Avelox (Moxifloxacin Hcl In Nacl) Swelling, Rash and Other (See Comments)    Patient became hypotensive after infusion started  . Penicillins     REACTION: anaphylaxis    Current Outpatient Prescriptions  Medication Sig Dispense Refill  . ACCU-CHEK SOFTCLIX LANCETS lancets USE AS DIRECTED  100 each  4  . amLODipine (NORVASC) 5 MG tablet Take 1 tablet (5 mg total) by mouth daily.  90 tablet  3  . aspirin 81 MG tablet Take 81 mg by mouth once a week.       . carvedilol (COREG) 25 MG tablet Take 25 mg by mouth 2 (two) times daily with a meal.       . CELEBREX 200 MG capsule Take 200 mg by mouth 2 (two) times daily.       . cholecalciferol (VITAMIN D) 1000 UNITS tablet Take 1,000 Units by mouth daily.      . colchicine 0.6 MG tablet Take 1 tablet (0.6 mg total) by mouth daily as needed. For gout  30 tablet  1  . furosemide (LASIX) 40 MG tablet Take 40 mg by mouth 2 (two) times daily.       Marland Kitchen gabapentin (NEURONTIN) 100 MG capsule Take 100 mg by mouth 2 (two) times daily.      Marland Kitchen glimepiride (AMARYL) 2 MG tablet Take 1 mg by mouth daily before breakfast.       . ipratropium (ATROVENT) 0.06 % nasal spray Place 1 spray into the nose daily.       Marland Kitchen losartan (COZAAR) 50 MG tablet Take 50 mg by mouth 2 (two) times daily.      . metFORMIN (GLUCOPHAGE) 500 MG tablet Take 500 mg by mouth 2 (two) times daily with a meal.      . methocarbamol (ROBAXIN) 500 MG tablet Take 500 mg by mouth 2 (two) times daily.       . mupirocin ointment (BACTROBAN) 2 %       . nitroGLYCERIN (NITROSTAT) 0.4 MG SL tablet Place 0.4 mg  under the tongue every 5 (five) minutes as needed.        Marland Kitchen oxyCODONE-acetaminophen (PERCOCET) 7.5-325 MG per tablet Take 1 tablet by mouth every 8 (eight) hours as needed. For pain      . potassium chloride (KLOR-CON) 20 MEQ packet Take 10 mEq by mouth 2 (two) times daily.       Marland Kitchen warfarin (COUMADIN) 2.5 MG tablet Take 2.5-5 mg by mouth daily. 2.5 mg daily except 5 mg on Wed and Sat      . warfarin (COUMADIN) 2.5 MG tablet TAKE AS DIRECTED BY ANTICOAGULATION CLINIC  40 tablet  3  . glucose blood (ACCU-CHEK COMFORT CURVE) test strip Use as instructed  100 each  4  . simvastatin (ZOCOR) 40 MG tablet Take 1 tablet (40 mg total) by mouth at bedtime.  90 tablet  1  . thiamine (VITAMIN B-1) 100 MG tablet Take 100 mg by mouth daily.      Marland Kitchen DISCONTD: potassium chloride SA (KLOR-CON M20) 20 MEQ tablet Take 1 tablet (20 mEq total)  by mouth daily.  90 tablet  3    History   Social History  . Marital Status: Married    Spouse Name: N/A    Number of Children: N/A  . Years of Education: N/A   Occupational History  . Not on file.   Social History Main Topics  . Smoking status: Former Smoker    Quit date: 03/21/1990  . Smokeless tobacco: Former Neurosurgeon  . Alcohol Use: 0.0 oz/week    21-28 Cans of beer per week     beer  . Drug Use: No  . Sexually Active: Not on file   Other Topics Concern  . Not on file   Social History Narrative  . No narrative on file    Family History  Problem Relation Age of Onset  . Hypertension Mother   . Stroke Mother   . Diabetes Father   . Coronary artery disease Father     Past Medical History  Diagnosis Date  . Diabetes mellitus   . Hyperlipidemia   . Hypertension   . Pilonidal cyst   . Atrial fibrillation     Previous long-term amiodarone therapy with multiple cardioversions / amiodarone stopped September, 2009  . Atrial flutter     Started November, 2010, Left-sided and cannot ablate  . Left atrial thrombus     Remote past... cardioversions done  since that time  . Wide-complex tachycardia   . Left ventricular ejection fraction less than 40%   . Gout   . AAA (abdominal aortic aneurysm)     Surgical repair  . Discolored skin   . S/P ICD (internal cardiac defibrillator) procedure     Dr. Ladona Ridgel 2009... by the pacing  . SOB (shortness of breath)     Large left effusion/ thoracentesis/hospitalization/November, 2011... exudated.. cytology negative.. Dr.Wert.. no proof of mesothelioma  . Pericardial effusion   . Pleural effusion     Large loculated effusion on the left side November, 2011. This was tapped. It was exudative. Cytology revealed no cancer no proof of mesothelioma area pulmonary team felt that no further workup was needed  . S/P AAA repair   . Spinal stenosis     Surgery Dr.Elsner  . CAD (coronary artery disease)     Catheterization July, 2008... name and vein grafts patent but low cardiac output  . Warfarin anticoagulation   . Cardiomyopathy     Ischemic... ICD  . CHF (congestive heart failure)     EF 30-40%... echo.. November, 2008  /  EF 55-60% echo... November, 2011  . Venous insufficiency     Toe discoloration chronic  . Mitral regurgitation     Mild echo  . Aortic valve sclerosis   . Nasal drainage     Chronic  . Alcohol ingestion of more than four drinks per week     Excess beer  not a dependency problem  . Chronotropic incompetence     IV pacing rate adjusted  . Thrombophlebitis of superficial veins of upper extremities     Possible venous stenosis from defibrillator  . Pericardial effusion     November, 2011 .. decreased during hospitalization  . Eye abnormality     Ophthalmologist questions a clot in one of his eyes, May, 2012  . Overweight     November, 2012  . Pleural thickening   . Ejection fraction < 50%     Ejection fraction has varied over time from 35-50%.,, Echoes are technically very difficult,,, EF 50%, echo, May 25, 2011,  technically very difficult  . Drug therapy     Redness and  swelling with Avelox infusion May 24, 2011    Past Surgical History  Procedure Date  . Colonoscopy w/ polypectomy   . Abdominal aortic aneurysm repair   . Coronary artery bypass graft   . Icd      insertion  . Lumbar fusion   . Pilonidal cyst removal   . Surgery scrotal / testicular     ROS   Patient denies fever, chills, headache, sweats, rash, change in vision, change in hearing, chest pain, cough, nausea vomiting, urinary symptoms. All other systems are reviewed and are negative.  PHYSICAL EXAM  Patient is stable today. He is here with his wife. He is oriented to person time and place. Affect is normal. There is no jugulovenous distention. Lungs are clear. Respiratory effort is nonlabored. Cardiac exam reveals an S1 and S2. There no clicks or significant murmurs. The abdomen is protuberant. There is no significant edema in the left lower extremity. There is 1-2 plus edema in the right lower extremity. He has evidence of healed cellulitis in his leg. There no rashes. He is walking with a cane.  Filed Vitals:   07/25/11 1324  BP: 132/88  Pulse: 69  Height: 6' (1.829 m)  Weight: 266 lb 9.6 oz (120.929 kg)     ASSESSMENT & PLAN

## 2011-07-25 NOTE — Patient Instructions (Signed)
Your physician recommends that you schedule a follow-up appointment in: 8 weeks  Dr Adele Dan office ([facility]) will call you with an appt.

## 2011-07-25 NOTE — Assessment & Plan Note (Signed)
Patient had surgery for spinal stenosis. Unfortunately he continues to have back pain. He tells me there is consideration being given to the possibility of a spinal stimulator. It will be up to the electrophysiologist as to whether or not this can be safe with his ICD. It is my understanding that this can be allowed.

## 2011-07-25 NOTE — Assessment & Plan Note (Signed)
The patient has venous insufficiency. I believe this is the basis of the ongoing swelling in his right lower extremity. It seems that this then progresses to frequent episodes of cellulitis. He has seen Dr. Cari Caraway in the past for various vascular problems. I am arranging for a followup visit to see if we get more device about how to deal with his leg.

## 2011-07-25 NOTE — Assessment & Plan Note (Signed)
Over the years his LV function has varied. His echo was technically very difficult. His most recent echo in March, 2013 revealed an ejection fraction of 50-55%. This was technically difficult. No change in therapy.

## 2011-07-29 ENCOUNTER — Telehealth: Payer: Self-pay | Admitting: Vascular Surgery

## 2011-07-31 ENCOUNTER — Other Ambulatory Visit: Payer: Self-pay | Admitting: Internal Medicine

## 2011-08-03 ENCOUNTER — Other Ambulatory Visit: Payer: Self-pay | Admitting: Internal Medicine

## 2011-08-09 ENCOUNTER — Ambulatory Visit (INDEPENDENT_AMBULATORY_CARE_PROVIDER_SITE_OTHER): Payer: Medicare Other | Admitting: Pharmacist

## 2011-08-09 DIAGNOSIS — I4891 Unspecified atrial fibrillation: Secondary | ICD-10-CM

## 2011-08-09 DIAGNOSIS — Z7901 Long term (current) use of anticoagulants: Secondary | ICD-10-CM

## 2011-08-17 ENCOUNTER — Ambulatory Visit: Payer: Medicare Other | Admitting: Vascular Surgery

## 2011-08-23 ENCOUNTER — Ambulatory Visit (INDEPENDENT_AMBULATORY_CARE_PROVIDER_SITE_OTHER): Payer: Medicare Other | Admitting: Internal Medicine

## 2011-08-23 ENCOUNTER — Encounter: Payer: Self-pay | Admitting: Internal Medicine

## 2011-08-23 ENCOUNTER — Ambulatory Visit: Payer: Medicare Other | Admitting: Cardiology

## 2011-08-23 ENCOUNTER — Ambulatory Visit (INDEPENDENT_AMBULATORY_CARE_PROVIDER_SITE_OTHER): Payer: Medicare Other

## 2011-08-23 VITALS — BP 125/91 | HR 85 | Ht 72.0 in | Wt 262.0 lb

## 2011-08-23 DIAGNOSIS — I472 Ventricular tachycardia: Secondary | ICD-10-CM

## 2011-08-23 DIAGNOSIS — I509 Heart failure, unspecified: Secondary | ICD-10-CM

## 2011-08-23 DIAGNOSIS — I4891 Unspecified atrial fibrillation: Secondary | ICD-10-CM

## 2011-08-23 DIAGNOSIS — I5023 Acute on chronic systolic (congestive) heart failure: Secondary | ICD-10-CM

## 2011-08-23 DIAGNOSIS — Z95810 Presence of automatic (implantable) cardiac defibrillator: Secondary | ICD-10-CM

## 2011-08-23 DIAGNOSIS — I2510 Atherosclerotic heart disease of native coronary artery without angina pectoris: Secondary | ICD-10-CM

## 2011-08-23 DIAGNOSIS — Z7901 Long term (current) use of anticoagulants: Secondary | ICD-10-CM

## 2011-08-23 LAB — ICD DEVICE OBSERVATION
AL AMPLITUDE: 0.875 mv
AL IMPEDENCE ICD: 494 Ohm
BATTERY VOLTAGE: 3.0409 V
CHARGE TIME: 9.259 s
FVT: 0
HV IMPEDENCE: 74 Ohm
PACEART VT: 0
RV LEAD AMPLITUDE: 16.5 mv
RV LEAD IMPEDENCE ICD: 532 Ohm
RV LEAD THRESHOLD: 0.75 V
TOT-0001: 1
TOT-0002: 0
TZAT-0001ATACH: 2
TZAT-0001ATACH: 3
TZAT-0001FASTVT: 1
TZAT-0001SLOWVT: 2
TZAT-0004FASTVT: 8
TZAT-0004SLOWVT: 8
TZAT-0004SLOWVT: 8
TZAT-0005FASTVT: 88 pct
TZAT-0005SLOWVT: 88 pct
TZAT-0011FASTVT: 10 ms
TZAT-0012ATACH: 150 ms
TZAT-0012ATACH: 150 ms
TZAT-0012SLOWVT: 200 ms
TZAT-0012SLOWVT: 200 ms
TZAT-0018ATACH: NEGATIVE
TZAT-0020ATACH: 1.5 ms
TZAT-0020ATACH: 1.5 ms
TZAT-0020SLOWVT: 1.5 ms
TZAT-0020SLOWVT: 1.5 ms
TZON-0003ATACH: 350 ms
TZON-0003SLOWVT: 340 ms
TZON-0003VSLOWVT: 450 ms
TZON-0004VSLOWVT: 20
TZST-0001ATACH: 4
TZST-0001ATACH: 6
TZST-0001FASTVT: 2
TZST-0001FASTVT: 3
TZST-0001FASTVT: 5
TZST-0001SLOWVT: 3
TZST-0001SLOWVT: 5
TZST-0003FASTVT: 25 J
TZST-0003FASTVT: 35 J
TZST-0003FASTVT: 35 J
TZST-0003SLOWVT: 15 J

## 2011-08-23 LAB — POCT INR: INR: 3.3

## 2011-08-23 NOTE — Assessment & Plan Note (Signed)
His device is working normally. Review of the left grams demonstrates that he is only biventricular pacing 60% of the time. No recurrent ventricular arrhythmias.

## 2011-08-23 NOTE — Patient Instructions (Signed)
Your physician wants you to follow-up in: 12 months with Dr Taylor You will receive a reminder letter in the mail two months in advance. If you don't receive a letter, please call our office to schedule the follow-up appointment.   Remote monitoring is used to monitor your Pacemaker of ICD from home. This monitoring reduces the number of office visits required to check your device to one time per year. It allows us to keep an eye on the functioning of your device to ensure it is working properly. You are scheduled for a device check from home on 12/01/11. You may send your transmission at any time that day. If you have a wireless device, the transmission will be sent automatically. After your physician reviews your transmission, you will receive a postcard with your next transmission date.   

## 2011-08-23 NOTE — Assessment & Plan Note (Signed)
The patient's congestive heart failure is currently class III. I realize however that his dyspnea is multifactorial. I have wondered whether controlling his ventricular rate might improve his dyspnea. However with his multiple medical problems I am not inclined to recommend AV node ablation at this time.

## 2011-08-23 NOTE — Assessment & Plan Note (Signed)
He denies anginal symptoms. He will continue his current medical therapy. 

## 2011-08-23 NOTE — Progress Notes (Signed)
HPI [name] returns today for followup. He is a very pleasant 71 year old man with a history of coronary artery disease, status post bypass surgery, chronic systolic heart failure, chronic atrial fibrillation, ventricular tachycardia, status post ICD implantation. The patient has class III heart failure symptoms. His biggest problem however is that he has severe pain in his back. He has had multiple back surgeries. In addition he has chronic peripheral edema and venous insufficiency. He may also have peripheral vascular disease. He has a history of cellulitis from his peripheral vascular disease and chronic venous stasis ulcers. He denies ICD shock. His heart failure symptoms are largely unchanged. His activity is limited most by his chronic back pain. He does note that he has relief when he gets into a pool. When asked why he doesn't exercise and a pool, he has no response. Allergies  Allergen Reactions  . Avelox (Moxifloxacin Hcl In Nacl) Swelling, Rash and Other (See Comments)    Patient became hypotensive after infusion started  . Penicillins     REACTION: anaphylaxis     Current Outpatient Prescriptions  Medication Sig Dispense Refill  . ACCU-CHEK SOFTCLIX LANCETS lancets USE AS DIRECTED  100 each  4  . amLODipine (NORVASC) 5 MG tablet Take 1 tablet (5 mg total) by mouth daily.  90 tablet  3  . aspirin 81 MG tablet Take 81 mg by mouth once a week.       . carvedilol (COREG) 25 MG tablet Take 25 mg by mouth 2 (two) times daily with a meal.      . CELEBREX 200 MG capsule Take 200 mg by mouth daily.       . cholecalciferol (VITAMIN D) 1000 UNITS tablet Take 1,000 Units by mouth daily.      . colchicine 0.6 MG tablet Take 1 tablet (0.6 mg total) by mouth daily as needed. For gout  30 tablet  1  . furosemide (LASIX) 40 MG tablet Take 1 tablet (40 mg total) by mouth 2 (two) times daily. Take an extra tablet daily if needed  180 tablet  2  . gabapentin (NEURONTIN) 100 MG capsule Take 100 mg by  mouth 2 (two) times daily.      Marland Kitchen glimepiride (AMARYL) 2 MG tablet Take 1 mg by mouth daily before breakfast.       . glimepiride (AMARYL) 2 MG tablet TAKE ONE TABLET BY MOUTH BEFORE BREAKFAST  90 tablet  1  . glucose blood (ACCU-CHEK COMFORT CURVE) test strip Use as instructed  100 each  4  . ipratropium (ATROVENT) 0.06 % nasal spray Place 1 spray into the nose daily.       Marland Kitchen losartan (COZAAR) 50 MG tablet Take 50 mg by mouth 2 (two) times daily.      . methocarbamol (ROBAXIN) 500 MG tablet Take 500 mg by mouth 2 (two) times daily.       . nitroGLYCERIN (NITROSTAT) 0.4 MG SL tablet Place 0.4 mg under the tongue every 5 (five) minutes as needed.        . potassium chloride (KLOR-CON) 20 MEQ packet Take 10 mEq by mouth 2 (two) times daily.       . simvastatin (ZOCOR) 40 MG tablet Take 1 tablet (40 mg total) by mouth at bedtime.  90 tablet  1  . simvastatin (ZOCOR) 40 MG tablet TAKE ONE TABLET BY MOUTH EVERY DAY AT BEDTIME  90 tablet  1  . thiamine (VITAMIN B-1) 100 MG tablet Take 100 mg by  mouth daily.      Marland Kitchen warfarin (COUMADIN) 2.5 MG tablet Take as directed by [facility]  120 tablet  0  . DISCONTD: carvedilol (COREG) 25 MG tablet Take one tablet by mouth in the am and two tablets by mouth in the pm.  270 tablet  2  . DISCONTD: potassium chloride SA (KLOR-CON M20) 20 MEQ tablet Take 1 tablet (20 mEq total) by mouth daily.  90 tablet  3     Past Medical History  Diagnosis Date  . Diabetes mellitus   . Hyperlipidemia   . Hypertension   . Pilonidal cyst   . Atrial fibrillation     Previous long-term amiodarone therapy with multiple cardioversions / amiodarone stopped September, 2009  . Atrial flutter     Started November, 2010, Left-sided and cannot ablate  . Left atrial thrombus     Remote past... cardioversions done since that time  . Wide-complex tachycardia   . Left ventricular ejection fraction less than 40%   . Gout   . AAA (abdominal aortic aneurysm)     Surgical repair  .  Discolored skin   . S/P ICD (internal cardiac defibrillator) procedure     Dr. Ladona Ridgel 2009... by the pacing  . SOB (shortness of breath)     Large left effusion/ thoracentesis/hospitalization/November, 2011... exudated.. cytology negative.. Dr.Wert.. no proof of mesothelioma  . Pericardial effusion   . Pleural effusion     Large loculated effusion on the left side November, 2011. This was tapped. It was exudative. Cytology revealed no cancer no proof of mesothelioma area pulmonary team felt that no further workup was needed  . S/P AAA repair   . Spinal stenosis     Surgery Dr.Elsner  . CAD (coronary artery disease)     Catheterization July, 2008... name and vein grafts patent but low cardiac output  . Warfarin anticoagulation   . Cardiomyopathy     Ischemic... ICD  . CHF (congestive heart failure)     EF 30-40%... echo.. November, 2008  /  EF 55-60% echo... November, 2011  . Venous insufficiency     Toe discoloration chronic  . Mitral regurgitation     Mild echo  . Aortic valve sclerosis   . Nasal drainage     Chronic  . Alcohol ingestion of more than four drinks per week     Excess beer  not a dependency problem  . Chronotropic incompetence     IV pacing rate adjusted  . Thrombophlebitis of superficial veins of upper extremities     Possible venous stenosis from defibrillator  . Pericardial effusion     November, 2011 .. decreased during hospitalization  . Eye abnormality     Ophthalmologist questions a clot in one of his eyes, May, 2012  . Overweight     November, 2012  . Pleural thickening   . Ejection fraction < 50%     Ejection fraction has varied over time from 35-50%.,, Echoes are technically very difficult,,, EF 50%, echo, May 25, 2011, technically very difficult  . Drug therapy     Redness and swelling with Avelox infusion May 24, 2011    ROS:   All systems reviewed and negative except as noted in the HPI.   Past Surgical History  Procedure Date  .  Colonoscopy w/ polypectomy   . Abdominal aortic aneurysm repair   . Coronary artery bypass graft   . Icd      insertion  . Lumbar fusion   .  Pilonidal cyst removal   . Surgery scrotal / testicular      Family History  Problem Relation Age of Onset  . Hypertension Mother   . Stroke Mother   . Diabetes Father   . Coronary artery disease Father      History   Social History  . Marital Status: Married    Spouse Name: N/A    Number of Children: N/A  . Years of Education: N/A   Occupational History  . Not on file.   Social History Main Topics  . Smoking status: Former Smoker    Quit date: 03/21/1990  . Smokeless tobacco: Former Neurosurgeon  . Alcohol Use: 0.0 oz/week    21-28 Cans of beer per week     beer  . Drug Use: No  . Sexually Active: Not on file   Other Topics Concern  . Not on file   Social History Narrative  . No narrative on file     BP 125/91  Pulse 85  Ht 6' (1.829 m)  Wt 262 lb (118.842 kg)  BMI 35.53 kg/m2  Physical Exam:  Chronically ill appearing 71 year old man, NAD HEENT: Unremarkable Neck:  8 cm JVD, no thyromegally Back:  No CVA tenderness Lungs:  Clear except for rales in the bases bilaterally. No wheezes or rhonchi. HEART:  Regular rate rhythm, no rubs, no clicks. Grade 2/6 systolic murmur is present at the left lower sternal border. Abd:  soft, positive bowel sounds, no organomegally, no rebound, no guarding Ext:  2 plus pulses, no edema, no cyanosis, no clubbing Skin:  No rashes no nodules Neuro:  CN II through XII intact, motor grossly intact  DEVICE  Normal device function.  See PaceArt for details.   Assess/Plan:

## 2011-08-30 ENCOUNTER — Encounter: Payer: Self-pay | Admitting: Vascular Surgery

## 2011-08-31 ENCOUNTER — Ambulatory Visit (INDEPENDENT_AMBULATORY_CARE_PROVIDER_SITE_OTHER): Payer: Medicare Other | Admitting: *Deleted

## 2011-08-31 ENCOUNTER — Ambulatory Visit (INDEPENDENT_AMBULATORY_CARE_PROVIDER_SITE_OTHER): Payer: Medicare Other | Admitting: Vascular Surgery

## 2011-08-31 ENCOUNTER — Encounter: Payer: Self-pay | Admitting: Vascular Surgery

## 2011-08-31 VITALS — BP 105/74 | HR 84 | Temp 97.9°F | Ht 72.0 in | Wt 265.0 lb

## 2011-08-31 DIAGNOSIS — I739 Peripheral vascular disease, unspecified: Secondary | ICD-10-CM

## 2011-08-31 DIAGNOSIS — R609 Edema, unspecified: Secondary | ICD-10-CM

## 2011-08-31 DIAGNOSIS — I872 Venous insufficiency (chronic) (peripheral): Secondary | ICD-10-CM

## 2011-08-31 DIAGNOSIS — R600 Localized edema: Secondary | ICD-10-CM

## 2011-08-31 NOTE — Assessment & Plan Note (Signed)
This patient has some chronic swelling of the right lower extremity and venous study today does show significant reflux in the right femoral and right popliteal veins. Of note there was no evidence of DVT in the right lower extremity. In addition given the previous groin incision for aortofemoral bypass graft, and vein harvest from the right leg for CABG, I think the patient also has a component of lymphedema. I've explained it however that the treatment for both of these is the same, that is, elevation and compression therapy. We have discussed the proper position for leg elevation and I have encouraged him to elevate his legs at daily. In addition he has been wearing compression stockings with an 18-22 mm of mercury pressure gradient which I think is a good compression for him. However his stockings are old and I've explained that these really need to be changed every 6 months as they stretch out. Currently he has no significant ulceration.

## 2011-08-31 NOTE — Assessment & Plan Note (Signed)
This patient's aortobifemoral bypass graft is patent and he has normal femoral pulses. This patient does have evidence of infrainguinal arterial occlusive disease on the left side. He has monophasic Doppler signals in the left foot. On the right side he has a biphasic dorsalis pedis signal side do not think that his symptoms in the right leg can be attributed to peripheral vascular disease. I think more likely symptoms in the right leg can be attributed to his back problems. He experiences pain in the right lower extremity simply with standing which would not fit with symptoms related to peripheral vascular disease. I have ordered follow up ABIs in 6 months and I'll see him back at that time. If he has problems with rest pain when he elevates his legs for his venous insufficiency then we could consider arteriography to further evaluate his infrainguinal arterial occlusive disease on the left.

## 2011-08-31 NOTE — Progress Notes (Signed)
[facility]  Patient name: [name] MRN: [id number] DOB: December 17, 1940 Sex: male  REASON FOR CONSULT: right lower extremity swelling and pain  HPI: [name] is a 70 y.o. male who underwent aortofemoral bypass grafting by Dr. Liliane Bade in 2004. This was for a 6.8 cm infrarenal abdominal aortic aneurysm. He has had swelling in his right leg off and on for least a year. In addition he's been having problems walking because of pain in the right leg. His symptoms are aggravated by walking but also occurs simply with standing. He has mild claudication symptoms on the left but his symptoms are more significant on the right side. He has had previous cellulitis in the right leg which is currently resolved. He's had no recent fever or chills. He is unaware of any previous history of DVT or phlebitis. Of note he has had the saphenous vein harvested from the right leg for previous CABG.  Past Medical History  Diagnosis Date  . Diabetes mellitus   . Hyperlipidemia   . Hypertension   . Pilonidal cyst   . Atrial fibrillation     Previous long-term amiodarone therapy with multiple cardioversions / amiodarone stopped September, 2009  . Atrial flutter     Started November, 2010, Left-sided and cannot ablate  . Left atrial thrombus     Remote past... cardioversions done since that time  . Wide-complex tachycardia   . Left ventricular ejection fraction less than 40%   . Gout   . AAA (abdominal aortic aneurysm)     Surgical repair  . Discolored skin   . S/P ICD (internal cardiac defibrillator) procedure     Dr. Ladona Ridgel 2009... by the pacing  . SOB (shortness of breath)     Large left effusion/ thoracentesis/hospitalization/November, 2011... exudated.. cytology negative.. Dr.Wert.. no proof of mesothelioma  . Pericardial effusion   . Pleural effusion     Large loculated effusion on the left side November, 2011. This was tapped. It was exudative.  Cytology revealed no cancer no proof of mesothelioma area pulmonary team felt that no further workup was needed  . S/P AAA repair   . Spinal stenosis     Surgery Dr.Elsner  . CAD (coronary artery disease)     Catheterization July, 2008... name and vein grafts patent but low cardiac output  . Warfarin anticoagulation   . Cardiomyopathy     Ischemic... ICD  . CHF (congestive heart failure)     EF 30-40%... echo.. November, 2008  /  EF 55-60% echo... November, 2011  . Venous insufficiency     Toe discoloration chronic  . Mitral regurgitation     Mild echo  . Aortic valve sclerosis   . Nasal drainage     Chronic  . Alcohol ingestion of more than four drinks per week     Excess beer  not a dependency problem  . Chronotropic incompetence     IV pacing rate adjusted  . Thrombophlebitis of superficial veins of upper extremities     Possible venous stenosis from defibrillator  . Pericardial effusion     November, 2011 .. decreased during hospitalization  . Eye abnormality     Ophthalmologist questions a clot in one of his eyes, May, 2012  . Overweight     November, 2012  . Pleural thickening   . Ejection fraction < 50%     Ejection fraction has varied over time from 35-50%.,, Echoes are technically very difficult,,, EF 50%,  echo, May 25, 2011, technically very difficult  . Drug therapy     Redness and swelling with Avelox infusion May 24, 2011  . COPD (chronic obstructive pulmonary disease)   . Myocardial infarction     Family History  Problem Relation Age of Onset  . Hypertension Mother   . Stroke Mother   . Diabetes Father   . Coronary artery disease Father   . Other Father     DVT    SOCIAL HISTORY: History  Substance Use Topics  . Smoking status: Former Smoker    Quit date: 03/22/1995  . Smokeless tobacco: Former Neurosurgeon  . Alcohol Use: 0.0 oz/week    21-28 Cans of beer per week     beer    Allergies  Allergen Reactions  . Avelox (Moxifloxacin Hcl In Nacl)  Swelling, Rash and Other (See Comments)    Patient became hypotensive after infusion started  . Penicillins     REACTION: anaphylaxis    Current Outpatient Prescriptions  Medication Sig Dispense Refill  . ACCU-CHEK SOFTCLIX LANCETS lancets USE AS DIRECTED  100 each  4  . amLODipine (NORVASC) 5 MG tablet Take 1 tablet (5 mg total) by mouth daily.  90 tablet  3  . aspirin 81 MG tablet Take 81 mg by mouth once a week.       . carvedilol (COREG) 25 MG tablet Take 25 mg by mouth 2 (two) times daily with a meal.      . CELEBREX 200 MG capsule Take 200 mg by mouth daily.       . cholecalciferol (VITAMIN D) 1000 UNITS tablet Take 1,000 Units by mouth daily.      . colchicine 0.6 MG tablet Take 1 tablet (0.6 mg total) by mouth daily as needed. For gout  30 tablet  1  . furosemide (LASIX) 40 MG tablet Take 1 tablet (40 mg total) by mouth 2 (two) times daily. Take an extra tablet daily if needed  180 tablet  2  . gabapentin (NEURONTIN) 100 MG capsule Take 100 mg by mouth 2 (two) times daily.      Marland Kitchen glimepiride (AMARYL) 2 MG tablet Take 1 mg by mouth daily before breakfast.       . glimepiride (AMARYL) 2 MG tablet TAKE ONE TABLET BY MOUTH BEFORE BREAKFAST  90 tablet  1  . glucose blood (ACCU-CHEK COMFORT CURVE) test strip Use as instructed  100 each  4  . ipratropium (ATROVENT) 0.06 % nasal spray Place 1 spray into the nose daily.       Marland Kitchen losartan (COZAAR) 50 MG tablet Take 50 mg by mouth 2 (two) times daily.      . methocarbamol (ROBAXIN) 500 MG tablet Take 500 mg by mouth 2 (two) times daily.       . nitroGLYCERIN (NITROSTAT) 0.4 MG SL tablet Place 0.4 mg under the tongue every 5 (five) minutes as needed.        . potassium chloride (KLOR-CON) 20 MEQ packet Take 10 mEq by mouth 2 (two) times daily.       . simvastatin (ZOCOR) 40 MG tablet Take 1 tablet (40 mg total) by mouth at bedtime.  90 tablet  1  . simvastatin (ZOCOR) 40 MG tablet TAKE ONE TABLET BY MOUTH EVERY DAY AT BEDTIME  90 tablet  1  .  thiamine (VITAMIN B-1) 100 MG tablet Take 100 mg by mouth daily.      Marland Kitchen warfarin (COUMADIN) 2.5 MG tablet Take as  directed by [facility]  120 tablet  0  . DISCONTD: potassium chloride SA (KLOR-CON M20) 20 MEQ tablet Take 1 tablet (20 mEq total) by mouth daily.  90 tablet  3    REVIEW OF SYSTEMS: Arly.Keller ] denotes positive finding; [  ] denotes negative finding  CARDIOVASCULAR:  [ ]  chest pain   [ ]  chest pressure   [ ]  palpitations   [ ]  orthopnea   Arly.Keller ] dyspnea on exertion   Arly.Keller ] claudication   [ ]  rest pain   [ ]  DVT   [ ]  phlebitis PULMONARY:   [ ]  productive cough   [ ]  asthma   [ ]  wheezing NEUROLOGIC:   [ ]  weakness  Arly.Keller ] paresthesias- right leg  [ ]  aphasia  [ ]  amaurosis  [ ]  dizziness HEMATOLOGIC:   [ ]  bleeding problems   [ ]  clotting disorders MUSCULOSKELETAL:  [ ]  joint pain   [ ]  joint swelling Arly.Keller ] leg swelling GASTROINTESTINAL: [ ]   blood in stool  [ ]   hematemesis GENITOURINARY:  [ ]   dysuria  [ ]   hematuria PSYCHIATRIC:  [ ]  history of major depression INTEGUMENTARY:  [ ]  rashes  [ ]  ulcers CONSTITUTIONAL:  [ ]  fever   [ ]  chills  PHYSICAL EXAM: Filed Vitals:   08/31/11 1410  BP: 105/74  Pulse: 84  Temp: 97.9 F (36.6 C)  TempSrc: Oral  Height: 6' (1.829 m)  Weight: 265 lb (120.203 kg)  SpO2: 96%   Body mass index is 35.94 kg/(m^2). GENERAL: The patient is a well-nourished male, in no acute distress. The vital signs are documented above. CARDIOVASCULAR: There is a regular rate and rhythm without significant murmur appreciated. I do not detect carotid bruits. He has palpable femoral pulses. He has bilateral lower extremity swelling. This is more significant on the right side. PULMONARY: There is good air exchange bilaterally without wheezing or rales. ABDOMEN: Soft and non-tender with normal pitched bowel sounds.  MUSCULOSKELETAL: There are no major deformities or cyanosis. NEUROLOGIC: No focal weakness or paresthesias are detected. SKIN: There are no ulcers or  rashes noted. PSYCHIATRIC: The patient has a normal affect.  DATA:   Lab Results  Component Value Date   CREATININE 1.4 07/11/2011   Lab Results  Component Value Date   INR 3.3 08/23/2011   INR 3.3 08/09/2011   INR 1.5 07/25/2011   PROTIME 16.7 09/08/2008   PROTIME 18.5 08/25/2008   Lab Results  Component Value Date   HGBA1C 7.1* 07/11/2011   I have independently interpreted his venous duplex study today. This shows no evidence of DVT in the right lower extremity. It is difficult to visualize the calf veins due to his edema. He was noted to have reflux in the right femoral vein and popliteal veins.    MEDICAL ISSUES:  Venous insufficiency This patient has some chronic swelling of the right lower extremity and venous study today does show significant reflux in the right femoral and right popliteal veins. Of note there was no evidence of DVT in the right lower extremity. In addition given the previous groin incision for aortofemoral bypass graft, and vein harvest from the right leg for CABG, I think the patient also has a component of lymphedema. I've explained it however that the treatment for both of these is the same, that is, elevation and compression therapy. We have discussed the proper position for leg elevation and I have encouraged him to elevate his legs  at daily. In addition he has been wearing compression stockings with an 18-22 mm of mercury pressure gradient which I think is a good compression for him. However his stockings are old and I've explained that these really need to be changed every 6 months as they stretch out. Currently he has no significant ulceration.  Peripheral vascular disease with claudication This patient's aortobifemoral bypass graft is patent and he has normal femoral pulses. This patient does have evidence of infrainguinal arterial occlusive disease on the left side. He has monophasic Doppler signals in the left foot. On the right side he has a biphasic dorsalis  pedis signal side do not think that his symptoms in the right leg can be attributed to peripheral vascular disease. I think more likely symptoms in the right leg can be attributed to his back problems. He experiences pain in the right lower extremity simply with standing which would not fit with symptoms related to peripheral vascular disease. I have ordered follow up ABIs in 6 months and I'll see him back at that time. If he has problems with rest pain when he elevates his legs for his venous insufficiency then we could consider arteriography to further evaluate his infrainguinal arterial occlusive disease on the left.   Pessy Delamar S [facility] Beeper: [phone]

## 2011-09-07 NOTE — Procedures (Unsigned)
DUPLEX DEEP VENOUS EXAM - LOWER EXTREMITY  INDICATION:  Edema.  HISTORY:  Edema:  Right lower extremity for about a month Trauma/Surgery:  The patient states the right lower extremity vein was used for heart surgery Pain:  Right groin and anterior lower leg tenderness PE:  No Previous DVT:  No Anticoagulants: Other:  DUPLEX EXAM:               CFV   SFV   PopV  PTV    GSV               R  L  R  L  R  L  R   L  R  L Thrombosis    o  o  o     o Spontaneous   +  +  +     + Phasic        +  +  +     + Augmentation  +  +  +     + Compressible  +  +  +     + Competent     +  +  o     o  Legend:  + - yes  o - no  p - partial  D - decreased  IMPRESSION: 1. No evidence of deep vein thrombosis noted in the right femoral     popliteal venous system.  Unable to visualize the right calf veins     due to edema.  The right great saphenous vein was not adequately     visualized. 2. Reflux of >500 milliseconds noted in the right femoral and     popliteal veins.    ___________________________ Di Kindle. Edilia Bo, M.D.  [facility]  D:  09/02/2011  T:  09/02/2011  Job:  [id number]

## 2011-09-26 ENCOUNTER — Ambulatory Visit (INDEPENDENT_AMBULATORY_CARE_PROVIDER_SITE_OTHER): Payer: Medicare Other | Admitting: *Deleted

## 2011-09-26 ENCOUNTER — Ambulatory Visit (INDEPENDENT_AMBULATORY_CARE_PROVIDER_SITE_OTHER): Payer: Medicare Other | Admitting: Cardiology

## 2011-09-26 ENCOUNTER — Ambulatory Visit: Payer: Medicare Other | Admitting: Cardiology

## 2011-09-26 ENCOUNTER — Encounter: Payer: Self-pay | Admitting: Cardiology

## 2011-09-26 VITALS — BP 110/80 | HR 80 | Ht 72.0 in | Wt 268.0 lb

## 2011-09-26 DIAGNOSIS — M545 Low back pain, unspecified: Secondary | ICD-10-CM

## 2011-09-26 DIAGNOSIS — I4891 Unspecified atrial fibrillation: Secondary | ICD-10-CM

## 2011-09-26 DIAGNOSIS — I872 Venous insufficiency (chronic) (peripheral): Secondary | ICD-10-CM

## 2011-09-26 DIAGNOSIS — N183 Chronic kidney disease, stage 3 unspecified: Secondary | ICD-10-CM

## 2011-09-26 DIAGNOSIS — R609 Edema, unspecified: Secondary | ICD-10-CM

## 2011-09-26 DIAGNOSIS — Z7901 Long term (current) use of anticoagulants: Secondary | ICD-10-CM

## 2011-09-26 MED ORDER — FUROSEMIDE 40 MG PO TABS: ORAL_TABLET | ORAL | Status: DC

## 2011-09-26 NOTE — Assessment & Plan Note (Signed)
Chemistry will be checked soon after we increase his Lasix dose.

## 2011-09-26 NOTE — Assessment & Plan Note (Signed)
Venous insufficiency plays a role in his right leg. This is being managed carefully

## 2011-09-26 NOTE — Patient Instructions (Addendum)
Your physician recommends that you schedule a follow-up appointment in: 4 weeks  Your physician has recommended you make the following change in your medication: Increase your lasix to 80mg  (2 tabs) twice daily for 3 days, then decrease to 80mg  (2 tabs) am and 40mg  (1 tab) pm.  Your physician recommends that you return for lab work in: one week (bmet)  Debby, Dr Henrietta Hoover nurse will be calling you to get your weights daily

## 2011-09-26 NOTE — Progress Notes (Signed)
HPI   The patient is doing well in general. He's had an excellent evaluation by vascular surgery. He is received very careful instructions on how to elevate his right leg. He does have edema in general is mild. He has excessive fluid intake. He drinks beer daily. His wife says up to 6 beers a day. He says it's not as much. I am concerned about the total fluid intake and a total alcohol intake. He has some exertional shortness of breath. Is not having PND or orthopnea.  As part of today's evaluation I have reviewed the electrophysiology notes. I have carefully tried to assess the decision concerning whether he can use an external stimulator for his back pain. I've spoken with the patient is wife about this and they say that Dr. Ladona Ridgel has approved.  The process is moving forward.  I've also reviewed the notes by vascular surgery concerning his entire vascular evaluation.  Allergies  Allergen Reactions  . Avelox (Moxifloxacin Hcl In Nacl) Swelling, Rash and Other (See Comments)    Patient became hypotensive after infusion started  . Penicillins     REACTION: anaphylaxis    Current Outpatient Prescriptions  Medication Sig Dispense Refill  . ACCU-CHEK SOFTCLIX LANCETS lancets USE AS DIRECTED  100 each  4  . amLODipine (NORVASC) 5 MG tablet Take 1 tablet (5 mg total) by mouth daily.  90 tablet  3  . aspirin 81 MG tablet Take 81 mg by mouth once a week.       . carvedilol (COREG) 25 MG tablet Take 25 mg by mouth 2 (two) times daily with a meal.      . CELEBREX 200 MG capsule Take 200 mg by mouth daily.       . cholecalciferol (VITAMIN D) 1000 UNITS tablet Take 1,000 Units by mouth daily.      . colchicine 0.6 MG tablet Take 1 tablet (0.6 mg total) by mouth daily as needed. For gout  30 tablet  1  . furosemide (LASIX) 40 MG tablet Take 1 tablet (40 mg total) by mouth 2 (two) times daily. Take an extra tablet daily if needed  180 tablet  2  . gabapentin (NEURONTIN) 100 MG capsule Take 100 mg by  mouth 2 (two) times daily.      Marland Kitchen glimepiride (AMARYL) 2 MG tablet       . glucose blood (ACCU-CHEK COMFORT CURVE) test strip Use as instructed  100 each  4  . ipratropium (ATROVENT) 0.06 % nasal spray Place 1 spray into the nose daily.       Marland Kitchen losartan (COZAAR) 50 MG tablet Take 50 mg by mouth 2 (two) times daily.      . methocarbamol (ROBAXIN) 500 MG tablet Take 500 mg by mouth 2 (two) times daily.       . nitroGLYCERIN (NITROSTAT) 0.4 MG SL tablet Place 0.4 mg under the tongue every 5 (five) minutes as needed.        . potassium chloride (KLOR-CON) 20 MEQ packet Take 10 mEq by mouth 2 (two) times daily.       . simvastatin (ZOCOR) 40 MG tablet Take 1 tablet (40 mg total) by mouth at bedtime.  90 tablet  1  . thiamine (VITAMIN B-1) 100 MG tablet Take 100 mg by mouth daily. Taking 125 daily      . warfarin (COUMADIN) 2.5 MG tablet Take as directed by [facility]  120 tablet  0  . DISCONTD: glimepiride (AMARYL) 2 MG  tablet TAKE ONE TABLET BY MOUTH BEFORE BREAKFAST  90 tablet  1  . DISCONTD: glimepiride (AMARYL) 2 MG tablet Take 1 mg by mouth daily before breakfast.       . DISCONTD: potassium chloride SA (KLOR-CON M20) 20 MEQ tablet Take 1 tablet (20 mEq total) by mouth daily.  90 tablet  3  . DISCONTD: simvastatin (ZOCOR) 40 MG tablet TAKE ONE TABLET BY MOUTH EVERY DAY AT BEDTIME  90 tablet  1    History   Social History  . Marital Status: Married    Spouse Name: N/A    Number of Children: N/A  . Years of Education: N/A   Occupational History  . Not on file.   Social History Main Topics  . Smoking status: Former Smoker    Quit date: 03/22/1995  . Smokeless tobacco: Former Neurosurgeon  . Alcohol Use: 0.0 oz/week    21-28 Cans of beer per week     beer  . Drug Use: No  . Sexually Active: Not on file   Other Topics Concern  . Not on file   Social History Narrative  . No narrative on file    Family History  Problem Relation Age of Onset  . Hypertension Mother   . Stroke  Mother   . Diabetes Father   . Coronary artery disease Father   . Other Father     DVT    Past Medical History  Diagnosis Date  . Diabetes mellitus   . Hyperlipidemia   . Hypertension   . Pilonidal cyst   . Atrial fibrillation     Previous long-term amiodarone therapy with multiple cardioversions / amiodarone stopped September, 2009  . Atrial flutter     Started November, 2010, Left-sided and cannot ablate  . Left atrial thrombus     Remote past... cardioversions done since that time  . Wide-complex tachycardia   . Left ventricular ejection fraction less than 40%   . Gout   . AAA (abdominal aortic aneurysm)     Surgical repair  . Discolored skin   . S/P ICD (internal cardiac defibrillator) procedure     Dr. Ladona Ridgel 2009... by the pacing  . SOB (shortness of breath)     Large left effusion/ thoracentesis/hospitalization/November, 2011... exudated.. cytology negative.. Dr.Wert.. no proof of mesothelioma  . Pericardial effusion   . Pleural effusion     Large loculated effusion on the left side November, 2011. This was tapped. It was exudative. Cytology revealed no cancer no proof of mesothelioma area pulmonary team felt that no further workup was needed  . S/P AAA repair   . Spinal stenosis     Surgery Dr.Elsner  . CAD (coronary artery disease)     Catheterization July, 2008... name and vein grafts patent but low cardiac output  . Warfarin anticoagulation   . Cardiomyopathy     Ischemic... ICD  . CHF (congestive heart failure)     EF 30-40%... echo.. November, 2008  /  EF 55-60% echo... November, 2011  . Venous insufficiency     Toe discoloration chronic  . Mitral regurgitation     Mild echo  . Aortic valve sclerosis   . Nasal drainage     Chronic  . Alcohol ingestion of more than four drinks per week     Excess beer  not a dependency problem  . Chronotropic incompetence     IV pacing rate adjusted  . Thrombophlebitis of superficial veins of upper extremities  Possible venous stenosis from defibrillator  . Pericardial effusion     November, 2011 .. decreased during hospitalization  . Eye abnormality     Ophthalmologist questions a clot in one of his eyes, May, 2012  . Overweight     November, 2012  . Pleural thickening   . Ejection fraction < 50%     Ejection fraction has varied over time from 35-50%.,, Echoes are technically very difficult,,, EF 50%, echo, May 25, 2011, technically very difficult  . Drug therapy     Redness and swelling with Avelox infusion May 24, 2011  . COPD (chronic obstructive pulmonary disease)   . Myocardial infarction     Past Surgical History  Procedure Date  . Colonoscopy w/ polypectomy   . Icd      insertion  . Lumbar fusion   . Pilonidal cyst removal   . Surgery scrotal / testicular   . Coronary artery bypass graft 2004  . Abdominal aortic aneurysm repair 11/2002    ROS  Patient denies fever, chills, headache, sweats, rash, change in vision, change in hearing, chest pain, cough, nausea vomiting, urinary symptoms. All other systems are reviewed and are negative.  PHYSICAL EXAM   Patient looks stable in general. His weight is up 2 pounds from the last visit. He has chronic edema in the right leg and mild edema in his left leg. There is no jugulovenous distention. Lungs are clear. Respiratory effort is nonlabored. Cardiac exam reveals S1-S2. There no clicks or significant murmurs. The abdomen is protuberant. He has 1-2+ edema the right leg and 1+ edema in the left leg.  Filed Vitals:   09/26/11 0917  BP: 110/80  Pulse: 80  Height: 6' (1.829 m)  Weight: 268 lb (121.564 kg)     ASSESSMENT & PLAN

## 2011-09-26 NOTE — Assessment & Plan Note (Signed)
He is going to be allowed to wear an external stimulator for his pain. This is being assessed.

## 2011-09-26 NOTE — Assessment & Plan Note (Signed)
The patient's total body fluid is increased. I'm sure this is related in part to his total intake including his beer intake. I have urged him to cut back on this. His wife is in full agreement. In addition he'll take Lasix 80 twice a day for 3 days and then the dose will be 80 in the morning and 40 later in the day. He was weighing himself daily and be in touch with our office.  Chemistry will be checked in one week.

## 2011-09-27 ENCOUNTER — Telehealth: Payer: Self-pay

## 2011-09-27 DIAGNOSIS — I509 Heart failure, unspecified: Secondary | ICD-10-CM

## 2011-09-27 DIAGNOSIS — R531 Weakness: Secondary | ICD-10-CM

## 2011-09-27 NOTE — Telephone Encounter (Signed)
Pt was called for daily weight.  [name] states his wt today is: 262#, swelling is the same and no sob.  Will call pt tomorrow.

## 2011-09-28 NOTE — Telephone Encounter (Signed)
Weight is 260#.  Edema is better and sob is the same.

## 2011-09-29 NOTE — Telephone Encounter (Signed)
Pt states his weight is 261# today.  Edema and sob unchanged.  He states per Dr Henrietta Hoover instructions, he will be decreasing his Lasix to 80mg  am and 40mg  pm.

## 2011-10-03 ENCOUNTER — Other Ambulatory Visit (INDEPENDENT_AMBULATORY_CARE_PROVIDER_SITE_OTHER): Payer: Medicare Other

## 2011-10-03 DIAGNOSIS — I872 Venous insufficiency (chronic) (peripheral): Secondary | ICD-10-CM

## 2011-10-03 DIAGNOSIS — R609 Edema, unspecified: Secondary | ICD-10-CM

## 2011-10-03 LAB — BASIC METABOLIC PANEL
BUN: 23 mg/dL (ref 6–23)
Calcium: 9 mg/dL (ref 8.4–10.5)
GFR: 44.31 mL/min — ABNORMAL LOW (ref >60.00)
Glucose, Bld: 152 mg/dL — ABNORMAL HIGH (ref 70–99)
Sodium: 141 mEq/L (ref 135–145)

## 2011-10-04 ENCOUNTER — Telehealth: Payer: Self-pay | Admitting: Internal Medicine

## 2011-10-04 NOTE — Telephone Encounter (Signed)
Caller: Laverle Hobby; PCP: [name]; CB#: [phone];  He has been off of his Metformin for several months and his blood sugars are steadily rising.  Same fasting this am was 165mg .  She is asking for an appt to come in for an A1C and then schedule his appt.   She is not with him at this time but is asking for lab appt. and then OV appt.  PLEASE call

## 2011-10-04 NOTE — Telephone Encounter (Signed)
Spoke with patient's wife, schedule appointment for labs and f/u with Hopp the following day

## 2011-10-04 NOTE — Telephone Encounter (Signed)
Pt states his weight is up one pound to 262# (261# over the weekend).  Slightly increased sob.  Edema the same as last week.  Per Dr Myrtis Ser, pt to monitor sob and call if increased, otherwise he will be seen tomorrow at his regular appt.

## 2011-10-05 ENCOUNTER — Other Ambulatory Visit (INDEPENDENT_AMBULATORY_CARE_PROVIDER_SITE_OTHER): Payer: Medicare Other

## 2011-10-05 DIAGNOSIS — E119 Type 2 diabetes mellitus without complications: Secondary | ICD-10-CM

## 2011-10-05 NOTE — Telephone Encounter (Signed)
Mrs Schoenberger states that Mr Hornbaker's weight is: 261# today.  SOB is slight and edema is the same (mild) as when Dr Myrtis Ser saw Mr Defenbaugh.

## 2011-10-05 NOTE — Progress Notes (Signed)
Labs only

## 2011-10-06 ENCOUNTER — Encounter: Payer: Self-pay | Admitting: Internal Medicine

## 2011-10-06 ENCOUNTER — Ambulatory Visit (INDEPENDENT_AMBULATORY_CARE_PROVIDER_SITE_OTHER): Payer: Medicare Other | Admitting: Internal Medicine

## 2011-10-06 VITALS — BP 116/74 | HR 86 | Temp 98.1°F | Wt 269.4 lb

## 2011-10-06 DIAGNOSIS — E119 Type 2 diabetes mellitus without complications: Secondary | ICD-10-CM

## 2011-10-06 NOTE — Progress Notes (Signed)
Subjective:    Patient ID: [name], male    DOB: 1940/12/02, 71 y.o.   MRN: [id number]  HPI Diabetes status assessment: Fasting or morning glucose range:112-170;    average :  156  . An isolated blood sugar was 217 the day after he received an epidural steroid injection Highest glucose 2 hours after any meal:  Not checked. Hypoglycemia :  no .                                                      Exercise : no . Nutrition/diet:  Low fat & no salt. Medication compliance : yes.   A1c/ urine microalbumin monitor:  A1c has increased from 7.1% on 4/22-7.3% on 7/17. He's been off his metformin. Creatinine was 1.6 on 7/15.    Review of Systems Excess thirst :no;  Excess hunger:  no ;  Excess urination:  Only with increased Lasix.                                  Lightheadedness with standing:  no. Chest pain:  no ; Palpitations :no ;  Pain in  calves with walking:  Some L calf .                                                                                                                             Non healing skin  ulcers or sores,especially over the feet: no. Numbness or tingling or burning in feet : no .                                                                                                                                             Significant change in  Weight : up 20#. Vision changes :no  .  Objective:   Physical Exam  He is in no acute distress; breath sounds are decreased; and no increased work of breathing  Heart rhythm is irregular, irregular  Abdomen is protuberant and tightly distended. It is nontender to palpation.  He has 1+ edema at the right ankle and 1/2+ on the left.  Pedal pulses are decreased        Assessment & Plan:

## 2011-10-06 NOTE — Assessment & Plan Note (Signed)
His A1c goal will be 8%. Because of elevated creatinine; metformin will not be reinitiated. Nutritional intervention will be stressed with her the A1c in 10-12 weeks

## 2011-10-06 NOTE — Patient Instructions (Addendum)
A1c assesses average 24 hour  glucose over prior 6-12 weeks. A1c GOALS:  Good diabetic control: 6.5-7 %  Fair diabetic control: 7-8 %  Poor diabetic control: greater than 8 % ( except with additional factors such as  advanced age; significant coronary or neurologic disease,etc). Check the A1c every  4 months if  6.5% or higher. Goals for home glucose monitoring are : fasting  or morning glucose goal of  100-150. Two hours after any meal , goal = < 180, preferably < 160. Report any low blood glucoses immediately.  Eat a low-fat diet with lots of fruits and vegetables, up to 7-9 servings per day. Consume less than 40(PREFERABLY ZERO) grams of sugar per day from foods & drinks with High Fructose Corn Syrup (HFCS) sugar as #1,2,3 or # 4 on label.Whole Foods, [employer] Jo[employer] & [employer] do not carry products with HFCS. Follow a  low carb nutrition program such as [place] or [employer]  to prevent Diabetes progression . White carbohydrates (potatoes, rice, bread, and pasta) have a high spike of sugar and a high load of sugar. For example a  baked potato has a cup of sugar and a  french fry  2 teaspoons of sugar. Yams, wild  rice, whole grained bread &  wheat pasta have been much lower spike and load of  sugar. Portions should be the size of a deck of cards or your palm. PLEASE BRING THESE INSTRUCTIONS TO FOLLOW UP  LAB APPOINTMENT in 10-12 weeks.This will guarantee correct labs are drawn, eliminating need for repeat blood sampling ( needle sticks ! ). Diagnoses /Codes: 250.02

## 2011-10-07 MED ORDER — METOLAZONE 2.5 MG PO TABS: 2.5000 mg | ORAL_TABLET | Freq: Every day | ORAL | Status: DC

## 2011-10-11 ENCOUNTER — Other Ambulatory Visit (INDEPENDENT_AMBULATORY_CARE_PROVIDER_SITE_OTHER): Payer: Medicare Other

## 2011-10-11 DIAGNOSIS — I509 Heart failure, unspecified: Secondary | ICD-10-CM

## 2011-10-11 DIAGNOSIS — R5381 Other malaise: Secondary | ICD-10-CM

## 2011-10-11 DIAGNOSIS — R531 Weakness: Secondary | ICD-10-CM

## 2011-10-11 DIAGNOSIS — R5383 Other fatigue: Secondary | ICD-10-CM

## 2011-10-11 LAB — BASIC METABOLIC PANEL
BUN: 49 mg/dL — ABNORMAL HIGH (ref 6–23)
Creatinine, Ser: 2.6 mg/dL — ABNORMAL HIGH (ref 0.4–1.5)
GFR: 26.62 mL/min — ABNORMAL LOW (ref >60.00)
Potassium: 3.4 mEq/L — ABNORMAL LOW (ref 3.5–5.1)

## 2011-10-11 NOTE — Telephone Encounter (Signed)
Mrs Bussiere states that Mr Valek's weight is down to 254# this am.  He has been having problems with weakness since Sunday.  She states his BP has been running low: 72/52-102/69.  Edema is better but sob is worse.  Dr Myrtis Ser was notified.

## 2011-10-11 NOTE — Telephone Encounter (Signed)
Pt reporting his BP to be running 89/66, 83/60, 73/58 today.  He is to hold his bp meds per Dr Myrtis Ser.

## 2011-10-11 NOTE — Telephone Encounter (Signed)
Pt notified of Dr Henrietta Hoover recommendations.

## 2011-10-11 NOTE — Telephone Encounter (Signed)
Pt to get a bmet today and hold all his diuretics today per Dr Myrtis Ser.  Call back tomorrow.

## 2011-10-11 NOTE — Telephone Encounter (Signed)
Pt rtn debby lefler's call, [phone] , wants call asap

## 2011-10-12 ENCOUNTER — Telehealth: Payer: Self-pay | Admitting: Cardiology

## 2011-10-12 NOTE — Telephone Encounter (Signed)
Pt states weight is up to 255# today.  He feels a "little better".  BP is running 104-124/88 without taking his bp meds today.  Pulse is running 84-87.  He was told to continue to hold his BP meds and diuretics.

## 2011-10-12 NOTE — Telephone Encounter (Signed)
Follow-up:    Patient returned your call.  Please call back. 

## 2011-10-12 NOTE — Telephone Encounter (Signed)
[name] was told that [name] should continue to hold his bp meds and diuretics per Dr Myrtis Ser.

## 2011-10-12 NOTE — Telephone Encounter (Signed)
F/u call

## 2011-10-13 NOTE — Telephone Encounter (Signed)
Pt states weight is up to 257# today.  BP running 157/97 and 126/84.  He was told per Dr Myrtis Ser to resume bp meds and to take 80mg  of Lasix qd x 2 days.

## 2011-10-14 ENCOUNTER — Telehealth: Payer: Self-pay | Admitting: Cardiology

## 2011-10-14 DIAGNOSIS — I509 Heart failure, unspecified: Secondary | ICD-10-CM

## 2011-10-14 NOTE — Telephone Encounter (Signed)
New Problem: ° ° ° °Patient returned your call.  Please call back. °

## 2011-10-14 NOTE — Telephone Encounter (Signed)
[name] was told that [name] needs to hold his lasix and his bp meds through the weekend per Dr Myrtis Ser.  He is to restart his lasix if his weight reaches 260#.  She understands.

## 2011-10-14 NOTE — Telephone Encounter (Signed)
Pt states his weight is 256# today and he feels "a little weaker today".  BP=139/94 before bp meds and 74/54 a few hours after bp meds.  Edema and sob pretty much unchanged. Pt to hold bp meds and diuretics per Dr Myrtis Ser and he is to restart the lasix if his weight is >260#.

## 2011-10-17 ENCOUNTER — Ambulatory Visit: Payer: Medicare Other | Admitting: Nurse Practitioner

## 2011-10-17 ENCOUNTER — Ambulatory Visit (INDEPENDENT_AMBULATORY_CARE_PROVIDER_SITE_OTHER): Payer: Medicare Other | Admitting: *Deleted

## 2011-10-17 DIAGNOSIS — Z951 Presence of aortocoronary bypass graft: Secondary | ICD-10-CM

## 2011-10-17 DIAGNOSIS — I82409 Acute embolism and thrombosis of unspecified deep veins of unspecified lower extremity: Secondary | ICD-10-CM

## 2011-10-17 DIAGNOSIS — I5023 Acute on chronic systolic (congestive) heart failure: Secondary | ICD-10-CM

## 2011-10-17 LAB — BASIC METABOLIC PANEL
Calcium: 8.8 mg/dL (ref 8.4–10.5)
GFR: 59.5 mL/min — ABNORMAL LOW (ref >60.00)
Sodium: 137 mEq/L (ref 135–145)

## 2011-10-17 NOTE — Telephone Encounter (Signed)
Per Dr Myrtis Ser, [name] is to take Lasix 80mg  today and get a BMET today.  He is also to restart his Colchicine at 0.6mg  bid per Dr Myrtis Ser.

## 2011-10-17 NOTE — Telephone Encounter (Signed)
Pt not to start Colchicine until seen per Dr Myrtis Ser as it may not be gout.  He is also not to start Indocin until his kidney function tests are back per Dr Myrtis Ser.  He was notified.

## 2011-10-17 NOTE — Telephone Encounter (Signed)
[name] came in for labs.  [name] states his bp is running 126/72 (right arm) and 132/78 (left arm).  Pulse is running 62-65.  They are also reporting a hard swelling above his elbow which is warm to the touch.  Dr Myrtis Ser states pt needs to be seen by his pcp or a PA or NP here in Cardiology.  Mrs Fredin states they are unable to wait an hour to be seen and states she will call his pcp tomorrow for a same day appt.

## 2011-10-17 NOTE — Telephone Encounter (Signed)
Patient returning nurse call she can be reached at hm#  °

## 2011-10-17 NOTE — Telephone Encounter (Signed)
Pt wife returning nurse call, she can be reached at [employer]

## 2011-10-17 NOTE — Telephone Encounter (Signed)
Mrs Shewell was notified.

## 2011-10-17 NOTE — Telephone Encounter (Signed)
Mrs Mundt states that Mr Laabs had a "tough weekend".  She states his weight today is 260# today.  259# on Sunday and 257# on Saturday.  He is more weak and has a swollen elbow which they say is gout.  His bp over the weekend his bp was running 152-165/92-112 in the am and 122-133/81-90 the rest of the day.  Pulse is ranging from 62-94.  Dr Myrtis Ser has been notified.

## 2011-10-18 ENCOUNTER — Encounter: Payer: Self-pay | Admitting: Internal Medicine

## 2011-10-18 ENCOUNTER — Ambulatory Visit (INDEPENDENT_AMBULATORY_CARE_PROVIDER_SITE_OTHER): Payer: Medicare Other | Admitting: Internal Medicine

## 2011-10-18 ENCOUNTER — Telehealth: Payer: Self-pay | Admitting: *Deleted

## 2011-10-18 VITALS — BP 148/82 | HR 86 | Temp 97.7°F | Wt 265.2 lb

## 2011-10-18 DIAGNOSIS — M25569 Pain in unspecified knee: Secondary | ICD-10-CM

## 2011-10-18 DIAGNOSIS — M25522 Pain in left elbow: Secondary | ICD-10-CM

## 2011-10-18 DIAGNOSIS — M25529 Pain in unspecified elbow: Secondary | ICD-10-CM

## 2011-10-18 DIAGNOSIS — M25561 Pain in right knee: Secondary | ICD-10-CM

## 2011-10-18 LAB — URIC ACID: Uric Acid, Serum: 9 mg/dL — ABNORMAL HIGH (ref 4.0–7.8)

## 2011-10-18 NOTE — Patient Instructions (Addendum)
To prevent gout the minimal uric acid goal is < 7; preferred is < 6, ideally < 5 to prevent gout.  The most common cause of elevated uric acid is the ingestion of sugar from high fructose corn syrup sources.  Consume ZERO grams of sugar per day from foods & drinks with High Fructose Corn Syrup (HFCS) sugar as #1,2,3 or # 4 on label.Whole Foods, [employer] Jo[employer] & [employer] do not carry products with HFCS. Please try to go on My Chart within the next 24 hours to allow me to release the results directly to you.  Alternatively cheaper therapeutically equivalent options may be available from   [facility] [phone] (toll-free).

## 2011-10-18 NOTE — Telephone Encounter (Signed)
I spoke with the patient's wife regarding his weight today. He is 261 lbs. Edema and SOB are stable. His BP this morning was (L) 176/106 HR- 94 & (R) 163/115 HR- 119. He saw Dr. Alwyn Ren regarding his arm swelling this morning. He rechecked his BP at 10:45 am (L) 148/82 HR- 86. Per the patient's wife, Dr. Alwyn Ren did feel like he was having an acute case of gout in his arm and knees. He was given a prescription for Colcrys to take one tablet by mouth today only. I called these results as well as the patient's BMP results from 7/29 to Dr. Myrtis Ser. Per Dr. Myrtis Ser- orders received to have the patient take lasix 40 mg BID today and restart coreg at 12.5 mg BID. He should also go ahead and take his Colcrys (the patient's total RX was for # 30 tabs, but the patient's wife only picked up # 5 tabs today). Hubert Azure will follow up with the patient tomorrow.  The patient's wife verbalizes understanding of Dr. Henrietta Hoover recommendations.

## 2011-10-18 NOTE — Progress Notes (Signed)
  Subjective:    Patient ID: [name], male    DOB: 01-24-1941, 71 y.o.   MRN: [id number]  HPI He has developed swelling of the left elbow over the last 4-5 days with associated pain. This has progressed and is even associated with swelling of his fingers of the left hand. During the same period time he's had increasing pain in his knees which is compromising ambulation.   The left elbow has been hot but he denies fever, chills, or sweats.  He does have gout; cardiology requested that he not start colchicine or indomethacin.    Review of Systems Cardiology has been adjusting his medications and monitoring  his BNP. There is been dramatic improvement in his creatinine: It was 2.6 on 7/23 and 1.3 on 7/29. His GFR also has  improved from 26.62-59.50. A nonfasting glucose was 227 yesterday.     Objective:   Physical Exam   He appears uncomfortable but in no acute distress  He has no lymphadenopathy about the neck or axilla  Breath sounds are decreased; I do not appreciate rales at the bases  Exhibits a tachyarrhythmia with slight flow murmur.  The left elbow has fusiform enlargement and is  slightly warm. I cannot appreciate definite fluctuance; there is a doughy quality on palpation  He also see some changes the knees without effusion. Crepitus is present bilaterally.        Assessment & Plan:  #1 elbow and knee pain; this is most likely acute gout.  Plan: Uric acid will be checked. With the GFR 59.5 one dose of colchicine would not be contraindicated. If elbow pain persists; he will need diagnostic tap by orthopedics.  Indomethacin is not an option with the warfarin. A burst of steroids would exacerbate his diabetes which is only fairly well controlled at this point

## 2011-10-19 ENCOUNTER — Telehealth: Payer: Self-pay

## 2011-10-19 NOTE — Telephone Encounter (Signed)
[name] states his weight is stable at 261# today.  No edema and no sob.

## 2011-10-19 NOTE — Telephone Encounter (Signed)
Spoke with patient's wife about labs, Mrs.Eroh verbalized understanding of Dr.Hopper's recommendations. Copy of labs to be mailed

## 2011-10-19 NOTE — Telephone Encounter (Signed)
Per Dr Myrtis Ser, [name] is to continue to take lasix 40 mg BID today and restart coreg at 12.5 mg BID.  Mrs raykwon hobbs understanding of this.

## 2011-10-19 NOTE — Telephone Encounter (Signed)
Message copied by Maurice Small on Wed Oct 19, 2011  5:00 PM ------      Message from: [name]      Created: Wed Oct 19, 2011  4:13 PM       To prevent gout the minimal uric acid goal is < 7; preferred is < 6, ideally < 5 to prevent gout.  The most common cause of elevated uric acid is the ingestion of sugar from high fructose corn syrup sources as we have " discussed" (e  "preaching" & nagging".. You should consume less than 40  (preferably ZERO) grams  of sugar per day from foods and drinks with high fructose corn syrup as number 1, 2, 3, or #4 on the label.       If you fill the colchicine; take one pill daily. Repeat the uric acid after making  nutritional changes above for at least 10-12 weeks. PLEASE BRING THESE INSTRUCTIONS TO FOLLOW UP  LAB APPOINTMENT.This will guarantee correct labs are drawn, eliminating need for repeat blood sampling ( needle sticks ! ). Diagnoses /Codes: 274.9. [employer]

## 2011-10-20 NOTE — Telephone Encounter (Signed)
Mrs Thain states that Mr Erven is still having a lot of pain from his gout.  She states his weight is down 2# to 259#.  Doing well regarding edema and sob.  BP=115/68 and P=59.

## 2011-10-25 NOTE — Telephone Encounter (Signed)
Mr Maiorino states his weight is 257# which is what it has been since 10/22/11.  He states his gout is doing better.  Still painful but not as painful as it was.  He states his bp has been running 119-124/81-88.  Pulse has been 70-81.

## 2011-10-26 NOTE — Telephone Encounter (Signed)
Mrs Bialy states that Mr Silversmith is doing better.  His weight is 255# today.  His edema is better (he has been wearing his compression hose and elevating his legs).  He still has slight sob.  Gout is almost gone in his elbow and better in his lower extremity (knee and foot).  His BP one hour after meds is 138/95.

## 2011-10-27 ENCOUNTER — Ambulatory Visit (INDEPENDENT_AMBULATORY_CARE_PROVIDER_SITE_OTHER): Payer: Medicare Other | Admitting: Cardiology

## 2011-10-27 ENCOUNTER — Encounter: Payer: Self-pay | Admitting: Cardiology

## 2011-10-27 ENCOUNTER — Ambulatory Visit (INDEPENDENT_AMBULATORY_CARE_PROVIDER_SITE_OTHER): Payer: Medicare Other | Admitting: *Deleted

## 2011-10-27 VITALS — BP 126/90 | HR 49 | Ht 72.0 in | Wt 260.1 lb

## 2011-10-27 DIAGNOSIS — I5023 Acute on chronic systolic (congestive) heart failure: Secondary | ICD-10-CM

## 2011-10-27 DIAGNOSIS — Z7901 Long term (current) use of anticoagulants: Secondary | ICD-10-CM

## 2011-10-27 DIAGNOSIS — N289 Disorder of kidney and ureter, unspecified: Secondary | ICD-10-CM

## 2011-10-27 DIAGNOSIS — M109 Gout, unspecified: Secondary | ICD-10-CM

## 2011-10-27 DIAGNOSIS — I10 Essential (primary) hypertension: Secondary | ICD-10-CM

## 2011-10-27 DIAGNOSIS — I4891 Unspecified atrial fibrillation: Secondary | ICD-10-CM

## 2011-10-27 LAB — POCT INR: INR: 2.2

## 2011-10-27 MED ORDER — WARFARIN SODIUM 2.5 MG PO TABS: ORAL_TABLET | ORAL | Status: DC

## 2011-10-27 MED ORDER — AMLODIPINE BESYLATE 5 MG PO TABS: 2.5000 mg | ORAL_TABLET | Freq: Every day | ORAL | Status: DC

## 2011-10-27 NOTE — Assessment & Plan Note (Signed)
Patient has had significant variations in his creatinine. Most recently it was good. Chemistry will be checked today.

## 2011-10-27 NOTE — Progress Notes (Signed)
HPI  Patient is seen today to followup his complex cardiovascular disease. I saw him last on September 26, 2011. My nurse has been calling him a regular basis to check on him including his weights. When I saw him last I did push his diuretic dose up. He did diuresis. However he developed worsening renal function. Had a similar time he developed significant gout. He had marked swelling in his left arm. He also had difficulty in his knees. When his renal function was abnormal I had stated that I want him to not be on nonsteroidal  Medications. Dr.Hopper has helped with the treatment of his gout. Her uric acid was drawn. It is my understanding that it's elevated. The patient has been on culture seen. The new form of colchicine is very expensive. Therefore the patient's wife has been obtaining it 5 pills at a time.  There has been a great deal of time spent coordinating his care at home by telephone and calling other physicians about him.  Allergies  Allergen Reactions  . Avelox (Moxifloxacin Hcl In Nacl) Swelling, Rash and Other (See Comments)    Patient became hypotensive after infusion started  . Penicillins     REACTION: anaphylaxis    Current Outpatient Prescriptions  Medication Sig Dispense Refill  . ACCU-CHEK SOFTCLIX LANCETS lancets USE AS DIRECTED  100 each  4  . aspirin 81 MG tablet Take 81 mg by mouth once a week.       . carvedilol (COREG) 25 MG tablet Take 12.5 mg by mouth 2 (two) times daily with a meal.       . cholecalciferol (VITAMIN D) 1000 UNITS tablet Take 1,000 Units by mouth daily.      . colchicine 0.6 MG tablet Take 1 tablet (0.6 mg total) by mouth daily as needed. For gout  30 tablet  1  . furosemide (LASIX) 40 MG tablet Take 40 mg by mouth 2 (two) times daily. 40 mg am, 40 mg pm (Per Cardiology-recently changed)      . gabapentin (NEURONTIN) 100 MG capsule Take 100 mg by mouth 2 (two) times daily.      Marland Kitchen glimepiride (AMARYL) 2 MG tablet Take 2 mg by mouth. 1/2 by mouth daily       . glucose blood (ACCU-CHEK COMFORT CURVE) test strip Use as instructed  100 each  4  . ipratropium (ATROVENT) 0.06 % nasal spray Place 1 spray into the nose daily.       . methocarbamol (ROBAXIN) 500 MG tablet Take 500 mg by mouth 2 (two) times daily.       . nitroGLYCERIN (NITROSTAT) 0.4 MG SL tablet Place 0.4 mg under the tongue every 5 (five) minutes as needed.        . potassium chloride (KLOR-CON) 20 MEQ packet Take 10 mEq by mouth 2 (two) times daily.       . simvastatin (ZOCOR) 40 MG tablet Take 1 tablet (40 mg total) by mouth at bedtime.  90 tablet  1  . Thiamine HCl (B-1 PO) Take 125 mcg by mouth daily.      Marland Kitchen warfarin (COUMADIN) 2.5 MG tablet Take as directed by [facility]  120 tablet  0  . DISCONTD: potassium chloride SA (KLOR-CON M20) 20 MEQ tablet Take 1 tablet (20 mEq total) by mouth daily.  90 tablet  3    History   Social History  . Marital Status: Married    Spouse Name: N/A    Number of  Children: N/A  . Years of Education: N/A   Occupational History  . Not on file.   Social History Main Topics  . Smoking status: Former Smoker    Quit date: 03/22/1995  . Smokeless tobacco: Former Neurosurgeon  . Alcohol Use: 0.0 oz/week    21-28 Cans of beer per week     beer  . Drug Use: No  . Sexually Active: Not on file   Other Topics Concern  . Not on file   Social History Narrative  . No narrative on file    Family History  Problem Relation Age of Onset  . Hypertension Mother   . Stroke Mother   . Diabetes Father   . Coronary artery disease Father   . Other Father     DVT    Past Medical History  Diagnosis Date  . Diabetes mellitus   . Hyperlipidemia   . Hypertension   . Pilonidal cyst   . Atrial fibrillation     Previous long-term amiodarone therapy with multiple cardioversions / amiodarone stopped September, 2009  . Atrial flutter     Started November, 2010, Left-sided and cannot ablate  . Left atrial thrombus     Remote past... cardioversions  done since that time  . Wide-complex tachycardia   . Left ventricular ejection fraction less than 40%   . Gout   . AAA (abdominal aortic aneurysm)     Surgical repair  . Discolored skin   . S/P ICD (internal cardiac defibrillator) procedure     Dr. Ladona Ridgel 2009... by the pacing  . SOB (shortness of breath)     Large left effusion/ thoracentesis/hospitalization/November, 2011... exudated.. cytology negative.. Dr.Wert.. no proof of mesothelioma  . Pericardial effusion   . Pleural effusion     Large loculated effusion on the left side November, 2011. This was tapped. It was exudative. Cytology revealed no cancer no proof of mesothelioma area pulmonary team felt that no further workup was needed  . S/P AAA repair   . Spinal stenosis     Surgery Dr.Elsner  . CAD (coronary artery disease)     Catheterization July, 2008... name and vein grafts patent but low cardiac output  . Warfarin anticoagulation   . Cardiomyopathy     Ischemic... ICD  . CHF (congestive heart failure)     EF 30-40%... echo.. November, 2008  /  EF 55-60% echo... November, 2011  . Venous insufficiency     Toe discoloration chronic  . Mitral regurgitation     Mild echo  . Aortic valve sclerosis   . Nasal drainage     Chronic  . Alcohol ingestion of more than four drinks per week     Excess beer  not a dependency problem  . Chronotropic incompetence     IV pacing rate adjusted  . Thrombophlebitis of superficial veins of upper extremities     Possible venous stenosis from defibrillator  . Pericardial effusion     November, 2011 .. decreased during hospitalization  . Eye abnormality     Ophthalmologist questions a clot in one of his eyes, May, 2012  . Overweight     November, 2012  . Pleural thickening   . Ejection fraction < 50%     Ejection fraction has varied over time from 35-50%.,, Echoes are technically very difficult,,, EF 50%, echo, May 25, 2011, technically very difficult  . Drug therapy     Redness and  swelling with Avelox infusion May 24, 2011  .  COPD (chronic obstructive pulmonary disease)   . Myocardial infarction     Past Surgical History  Procedure Date  . Colonoscopy w/ polypectomy   . Icd      insertion  . Lumbar fusion   . Pilonidal cyst removal   . Surgery scrotal / testicular   . Coronary artery bypass graft 2004  . Abdominal aortic aneurysm repair 11/2002    ROS  Patient denies fever, chills, headache, sweats, rash, change in vision, change in hearing, chest pain, cough, nausea vomiting, urinary symptoms. All other systems are reviewed and are negative.  PHYSICAL EXAM  Patient actually looks much better than he has recently. He's been taking colchicine daily and his left elbow and knees are much improved. He is oriented to person time and place. Affect is normal. There is no jugulovenous distention. Lungs are clear. Respiratory effort is nonlabored. Cardiac exam reveals S1 and S2. There no clicks or significant murmurs. The abdomen is soft. He has no significant peripheral edema today. There are no rashes.  Filed Vitals:   10/27/11 1451  BP: 126/90  Pulse: 49  Height: 6' (1.829 m)  Weight: 260 lb 1.9 oz (117.99 kg)  SpO2: 98%     ASSESSMENT & PLAN

## 2011-10-27 NOTE — Assessment & Plan Note (Signed)
Over time the patient has had marked very aeration in his blood pressure. We have titrated his meds carefully. Recently with significant diuresis he had significant hypotension. I have decreased his medication significantly. His blood pressure is creeping up again. We will restart amlodipine at 2.5 mg daily.

## 2011-10-27 NOTE — Assessment & Plan Note (Signed)
The patient had a significant attack of gout. I am uncomfortable with him stopping his colchicine at this point. I've asked him to continue it and to plan to see Dr. Alwyn Ren in his office to plan the next step in his care. The question will include whether allopurinol should be started. We'll also include how long the patient should remain on colchicine.

## 2011-10-27 NOTE — Assessment & Plan Note (Signed)
The patient's volume status is stable at this time. We will continue to watch his weights.

## 2011-10-27 NOTE — Patient Instructions (Addendum)
Your physician recommends that you schedule a follow-up appointment in: 6 weeks  Your physician recommends that you return for lab work in: today (bmet)  Your physician has recommended you make the following change in your medication: Cut your amlodipine in half and take one-half tab daily.  Continue taking one-half tab of carvedilol twice daily

## 2011-10-28 ENCOUNTER — Telehealth: Payer: Self-pay

## 2011-10-28 LAB — BASIC METABOLIC PANEL
Calcium: 9.1 mg/dL (ref 8.4–10.5)
GFR: 51.47 mL/min — ABNORMAL LOW (ref >60.00)
Glucose, Bld: 102 mg/dL — ABNORMAL HIGH (ref 70–99)
Potassium: 4.1 mEq/L (ref 3.5–5.1)
Sodium: 141 mEq/L (ref 135–145)

## 2011-10-28 NOTE — Telephone Encounter (Signed)
Pt states he feels better.  He states his weight today is 257#.  Edema and sob are better.

## 2011-10-31 NOTE — Telephone Encounter (Signed)
Pt reports his weight as 256# today.  Edema and sob the same.  BP=110/80.

## 2011-11-01 NOTE — Telephone Encounter (Signed)
Weight today is 243#.  Edema is the same and no sob.  Pt scheduled to get his pain stimulator today.

## 2011-11-01 NOTE — Telephone Encounter (Signed)
Correction, weight is 255# today.  Edema and sob unchanged.  Pt is scheduled to get pain stimulator today.

## 2011-11-02 ENCOUNTER — Ambulatory Visit (INDEPENDENT_AMBULATORY_CARE_PROVIDER_SITE_OTHER): Payer: Medicare Other | Admitting: Internal Medicine

## 2011-11-02 ENCOUNTER — Encounter: Payer: Self-pay | Admitting: Internal Medicine

## 2011-11-02 VITALS — BP 110/70 | HR 75 | Temp 97.5°F | Wt 262.8 lb

## 2011-11-02 DIAGNOSIS — Z7901 Long term (current) use of anticoagulants: Secondary | ICD-10-CM

## 2011-11-02 DIAGNOSIS — M109 Gout, unspecified: Secondary | ICD-10-CM

## 2011-11-02 DIAGNOSIS — N289 Disorder of kidney and ureter, unspecified: Secondary | ICD-10-CM

## 2011-11-02 DIAGNOSIS — Z86718 Personal history of other venous thrombosis and embolism: Secondary | ICD-10-CM

## 2011-11-02 LAB — POCT INR: INR: 1.1

## 2011-11-02 NOTE — Assessment & Plan Note (Signed)
There is been dramatic improvement in his renal function; values 8/8  reveal only mild impairment. We would still want to avoid allopurinol if possible. Additionally nonsteroidals should be avoided because of the cardiovascular risk.

## 2011-11-02 NOTE — Telephone Encounter (Signed)
[name] states his weight is 256 today.  Edema and sob are unchanged.  BP=140/96  P=77

## 2011-11-02 NOTE — Progress Notes (Signed)
  Subjective:    Patient ID: [name], male    DOB: 1941/01/17, 71 y.o.   MRN: [id number]  HPI His arthritic symptoms of the left elbow, third left finger, and both knees has improved. He has been taking Colcrys once daily. This medication is very expensive; 5 pills cost $28. They have not checked into getting colchicine  from [place]. His last uric acid was 9 on 7/31. Uric acid has ranged from 8.5-10 over the last 4 years. Uric acid goals were discussed.      Review of Systems Dr. Henrietta Hoover 8/8 office visit was reviewed. His complex cardiovascular issues have been complicated by intermittent renal insufficiency. 3 weeks ago his creatinine was 2.6 and his GFR 26.62. As of 8/8 is creatinine was high normal at 1.4 and GFR only mildly reduced at 51.47.  A spinal stimulator has been applied. He previously was receiving epidural steroids; the potential adverse effects of ESI  on his diabetes has been discussed     Objective:   Physical Exam He appears healthy &  in no acute distress  He has no lymphadenopathy about the neck, axilla, or epitrochlear areas.  Chest is clear with no increased work of breathing or abnormal breath sounds  Heart sounds are distant; the rhythm is irregular  There appears to be an exostosis of the left elbow with some elasticity of the tissues. I cannot appreciate definite olecranon bursal effusion.  He has crepitus of the knees with fusiform change. Do not appreciate patellar effusions.  There is some fusiform change of the PIP of the third left finger.        Assessment & Plan:

## 2011-11-02 NOTE — Assessment & Plan Note (Signed)
I have encouraged them to see if they can get generic colchicine from [place]. We'll hold his Colcrys at this time unless he has a flare of joint symptoms.

## 2011-11-02 NOTE — Patient Instructions (Addendum)
Increase the Coumadin to 5 mg for 2 more  days then resume your normal schedule. Please  schedule  Labs 12/28/11 : A1c, urine microalbumin, uric acid. PLEASE BRING THESE INSTRUCTIONS TO FOLLOW UP  LAB APPOINTMENT.This will guarantee correct labs are drawn, eliminating need for repeat blood sampling ( needle sticks ! ). Diagnoses /Codes: 274.9,250.00. Generic Colchicine may be available from  [facility] [place] [phone] (toll-free).

## 2011-11-03 NOTE — Telephone Encounter (Signed)
Fu call Pt's wife calling you back

## 2011-11-03 NOTE — Telephone Encounter (Signed)
[name] was told to increase his amlodipine to 5mg  daily per Dr Myrtis Ser

## 2011-11-03 NOTE — Telephone Encounter (Signed)
[name] states his weight is 257# this am.  Edema and sob are the same.  He will call back with vital signs after taking them.

## 2011-11-03 NOTE — Telephone Encounter (Signed)
Mrs Lizarraga reports that Mr Feinberg's bp is 164/97 this afternoon.

## 2011-11-07 NOTE — Telephone Encounter (Signed)
Weights over the weekend per Mrs Senne are as follows:  8/16=256#, 8/17=257, 8/18=257, 8/19=256#.   Edema is unchanged (better with elevation and compression hose) and sob is slightly better.  BP today after meds=120/81 P=56

## 2011-11-08 NOTE — Telephone Encounter (Signed)
Called pt for weight results.  N/A.  LMTCB. 

## 2011-11-09 ENCOUNTER — Telehealth: Payer: Self-pay

## 2011-11-09 NOTE — Telephone Encounter (Signed)
I called [name] to get his weight today.  He was having his pain stimulator removed yesterday so I was unable to reach him yesterday.  [name] states his weight yesterday was 258# and today is 256#.  Edema and sob are mostly unchanged but might be slightly better.

## 2011-11-10 ENCOUNTER — Other Ambulatory Visit: Payer: Medicare Other

## 2011-11-11 ENCOUNTER — Telehealth: Payer: Self-pay | Admitting: Cardiology

## 2011-11-11 NOTE — Telephone Encounter (Signed)
New Problem:    Patient called in returning your call. Please call back. 

## 2011-11-11 NOTE — Telephone Encounter (Signed)
Weight=253# today.  Edema and sob unchanged.  BP=116/79  P=67 

## 2011-11-11 NOTE — Telephone Encounter (Signed)
Weight=253# today.  Edema and sob unchanged.  BP=116/79  P=67

## 2011-11-11 NOTE — Telephone Encounter (Signed)
rtn your call °

## 2011-11-14 NOTE — Telephone Encounter (Signed)
Mrs Leicht states that Mr Perusse's weight is 254# this morning.  Edema and sob are unchanged.

## 2011-11-15 NOTE — Telephone Encounter (Signed)
[name] states his weight is 254#.  His edema and sob are unchanged.  BP=123/86 P=75.

## 2011-11-16 NOTE — Telephone Encounter (Signed)
Wt=254#.  Edema and sob are unchanged.  BP=117/81.  P=68

## 2011-11-17 NOTE — Telephone Encounter (Signed)
Weight=255#.  Edema and sob are unchanged.  BP=127/85.  P=64.

## 2011-11-22 ENCOUNTER — Telehealth: Payer: Self-pay | Admitting: Cardiology

## 2011-11-22 NOTE — Telephone Encounter (Signed)
Pt wife returning nurse [place] Call, she can be reached at [phone]

## 2011-11-22 NOTE — Telephone Encounter (Signed)
[name] called to report that [name]'s weight on 8/30 was 254#.  They are out of town and forgot to bring the scale so do not have a weight for today or the rest of this week.

## 2011-11-22 NOTE — Telephone Encounter (Signed)
N/A.  LMTC. 

## 2011-11-23 ENCOUNTER — Ambulatory Visit: Payer: Medicare Other | Admitting: Cardiology

## 2011-11-25 NOTE — Telephone Encounter (Signed)
[name] reports that his weight is up 6# since he left town on 11/18/11.  He is having increased swelling in bil le also.  Per Dr Myrtis Ser, [name] Guo is to increase his lasix to 40mg  bid x 2 days.  I will call him on Monday and then Dr Myrtis Ser will re-evaluate his dose.  He understands and agrees.

## 2011-11-28 NOTE — Telephone Encounter (Signed)
Weight today is 251#.  Edema and sob are better.  BP=122/83.  P=60.

## 2011-11-29 ENCOUNTER — Telehealth: Payer: Self-pay | Admitting: Cardiology

## 2011-11-29 NOTE — Telephone Encounter (Signed)
Pt wife returning nurse call, she can be reached at [phone]

## 2011-11-29 NOTE — Telephone Encounter (Signed)
Weight=253# today.  Edema is better and sob is unchanged.  BP=127/83.

## 2011-11-30 NOTE — Telephone Encounter (Signed)
Weight=253#.  Edema unchanged and sob unchanged.  BP=129/88.

## 2011-12-01 ENCOUNTER — Encounter: Payer: Self-pay | Admitting: Internal Medicine

## 2011-12-01 ENCOUNTER — Ambulatory Visit (INDEPENDENT_AMBULATORY_CARE_PROVIDER_SITE_OTHER): Payer: Medicare Other | Admitting: *Deleted

## 2011-12-01 ENCOUNTER — Ambulatory Visit (INDEPENDENT_AMBULATORY_CARE_PROVIDER_SITE_OTHER): Payer: Medicare Other | Admitting: Cardiology

## 2011-12-01 ENCOUNTER — Encounter: Payer: Self-pay | Admitting: Cardiology

## 2011-12-01 VITALS — BP 142/88 | HR 81 | Ht 72.0 in | Wt 259.0 lb

## 2011-12-01 DIAGNOSIS — I4891 Unspecified atrial fibrillation: Secondary | ICD-10-CM

## 2011-12-01 DIAGNOSIS — I509 Heart failure, unspecified: Secondary | ICD-10-CM

## 2011-12-01 DIAGNOSIS — Z7901 Long term (current) use of anticoagulants: Secondary | ICD-10-CM

## 2011-12-01 DIAGNOSIS — I4729 Other ventricular tachycardia: Secondary | ICD-10-CM

## 2011-12-01 DIAGNOSIS — I472 Ventricular tachycardia: Secondary | ICD-10-CM

## 2011-12-01 LAB — BASIC METABOLIC PANEL WITH GFR
BUN: 20 mg/dL (ref 6–23)
CO2: 24 meq/L (ref 19–32)
Calcium: 9.2 mg/dL (ref 8.4–10.5)
Chloride: 105 meq/L (ref 96–112)
Creatinine, Ser: 1.2 mg/dL (ref 0.4–1.5)
GFR: 66.7 mL/min
Glucose, Bld: 87 mg/dL (ref 70–99)
Potassium: 3.8 meq/L (ref 3.5–5.1)
Sodium: 140 meq/L (ref 135–145)

## 2011-12-01 LAB — POCT INR: INR: 3

## 2011-12-01 MED ORDER — FUROSEMIDE 40 MG PO TABS: ORAL_TABLET | ORAL | Status: DC

## 2011-12-01 NOTE — Progress Notes (Signed)
HPI   Patient is seen back today to followup fluid overload. We are working on teaching him how to titrate his medications. We called him frequently at home. Recently his weight was up and his diuretic was increased from 40 twice a day to 80 twice a day. He diaries nicely. His weight is creeping up again today.  Allergies  Allergen Reactions  . Avelox (Moxifloxacin Hcl In Nacl) Swelling, Rash and Other (See Comments)    Patient became hypotensive after infusion started  . Penicillins     REACTION: anaphylaxis    Current Outpatient Prescriptions  Medication Sig Dispense Refill  . ACCU-CHEK SOFTCLIX LANCETS lancets USE AS DIRECTED  100 each  4  . amLODipine (NORVASC) 5 MG tablet Take 5 mg by mouth daily.      Marland Kitchen aspirin 81 MG tablet Take 81 mg by mouth once a week.       . carvedilol (COREG) 25 MG tablet Take 12.5 mg by mouth 2 (two) times daily with a meal.       . cholecalciferol (VITAMIN D) 1000 UNITS tablet Take 1,000 Units by mouth daily.      . colchicine 0.6 MG tablet Take 1 tablet (0.6 mg total) by mouth daily as needed. For gout  30 tablet  1  . furosemide (LASIX) 40 MG tablet Take 40 mg by mouth 2 (two) times daily. 40 mg am, 40 mg pm (Per Cardiology-recently changed)      . gabapentin (NEURONTIN) 100 MG capsule Take 100 mg by mouth 2 (two) times daily.      Marland Kitchen glimepiride (AMARYL) 2 MG tablet Take 2 mg by mouth. 1/2 by mouth daily      . glucose blood (ACCU-CHEK COMFORT CURVE) test strip Use as instructed  100 each  4  . ipratropium (ATROVENT) 0.06 % nasal spray Place 1 spray into the nose daily.       . methocarbamol (ROBAXIN) 500 MG tablet Take 500 mg by mouth 2 (two) times daily.       . nitroGLYCERIN (NITROSTAT) 0.4 MG SL tablet Place 0.4 mg under the tongue every 5 (five) minutes as needed.        . potassium chloride (KLOR-CON) 20 MEQ packet Take 10 mEq by mouth 2 (two) times daily.       . simvastatin (ZOCOR) 40 MG tablet Take 1 tablet (40 mg total) by mouth at bedtime.   90 tablet  1  . Thiamine HCl (B-1 PO) Take 125 mcg by mouth daily.      Marland Kitchen DISCONTD: potassium chloride SA (KLOR-CON M20) 20 MEQ tablet Take 1 tablet (20 mEq total) by mouth daily.  90 tablet  3    History   Social History  . Marital Status: Married    Spouse Name: N/A    Number of Children: N/A  . Years of Education: N/A   Occupational History  . Not on file.   Social History Main Topics  . Smoking status: Former Smoker    Quit date: 03/22/1995  . Smokeless tobacco: Former Neurosurgeon  . Alcohol Use: 0.0 oz/week    21-28 Cans of beer per week     beer  . Drug Use: No  . Sexually Active: Not on file   Other Topics Concern  . Not on file   Social History Narrative  . No narrative on file    Family History  Problem Relation Age of Onset  . Hypertension Mother   . Stroke Mother   .  Diabetes Father   . Coronary artery disease Father   . Other Father     DVT    Past Medical History  Diagnosis Date  . Diabetes mellitus   . Hyperlipidemia   . Hypertension   . Pilonidal cyst   . Atrial fibrillation     Previous long-term amiodarone therapy with multiple cardioversions / amiodarone stopped September, 2009  . Atrial flutter     Started November, 2010, Left-sided and cannot ablate  . Left atrial thrombus     Remote past... cardioversions done since that time  . Wide-complex tachycardia   . Left ventricular ejection fraction less than 40%   . Gout   . AAA (abdominal aortic aneurysm)     Surgical repair  . Discolored skin   . S/P ICD (internal cardiac defibrillator) procedure     Dr. Ladona Ridgel 2009... by the pacing  . SOB (shortness of breath)     Large left effusion/ thoracentesis/hospitalization/November, 2011... exudated.. cytology negative.. Dr.Wert.. no proof of mesothelioma  . Pericardial effusion   . Pleural effusion     Large loculated effusion on the left side November, 2011. This was tapped. It was exudative. Cytology revealed no cancer no proof of mesothelioma  area pulmonary team felt that no further workup was needed  . S/P AAA repair   . Spinal stenosis     Surgery Dr.Elsner  . CAD (coronary artery disease)     Catheterization July, 2008... name and vein grafts patent but low cardiac output  . Warfarin anticoagulation   . Cardiomyopathy     Ischemic... ICD  . CHF (congestive heart failure)     EF 30-40%... echo.. November, 2008  /  EF 55-60% echo... November, 2011  . Venous insufficiency     Toe discoloration chronic  . Mitral regurgitation     Mild echo  . Aortic valve sclerosis   . Nasal drainage     Chronic  . Alcohol ingestion of more than four drinks per week     Excess beer  not a dependency problem  . Chronotropic incompetence     IV pacing rate adjusted  . Thrombophlebitis of superficial veins of upper extremities     Possible venous stenosis from defibrillator  . Pericardial effusion     November, 2011 .. decreased during hospitalization  . Eye abnormality     Ophthalmologist questions a clot in one of his eyes, May, 2012  . Overweight     November, 2012  . Pleural thickening   . Ejection fraction < 50%     Ejection fraction has varied over time from 35-50%.,, Echoes are technically very difficult,,, EF 50%, echo, May 25, 2011, technically very difficult  . Drug therapy     Redness and swelling with Avelox infusion May 24, 2011  . COPD (chronic obstructive pulmonary disease)   . Myocardial infarction     Past Surgical History  Procedure Date  . Colonoscopy w/ polypectomy   . Icd      insertion  . Lumbar fusion   . Pilonidal cyst removal   . Surgery scrotal / testicular   . Coronary artery bypass graft 2004  . Abdominal aortic aneurysm repair 11/2002    ROS   Patient denies fever, chills, headache, sweats, rash, change in vision, change in hearing, chest pain, cough, nausea vomiting, urinary symptoms. All other systems are reviewed and are negative.  PHYSICAL EXAM   Patient is oriented to person time and  place. Affect is normal.  He's here with his wife. There is no jugular venous distention. Lungs reveal a few scattered rhonchi. There is no respiratory distress. Cardiac exam reveals S1 and S2. There no clicks or significant murmurs. The abdomen is soft. He does have 1+ peripheral edema.  Filed Vitals:   12/01/11 1502  BP: 142/88  Pulse: 81  Height: 6' (1.829 m)  Weight: 259 lb (117.482 kg)   EKG is done today and reviewed by me. It is paced.  ASSESSMENT & PLAN

## 2011-12-01 NOTE — Telephone Encounter (Signed)
Wt=254#.  Pt to take 80mg  lasix bid until wt is 251#.  Then decrease to 40mg  bid.

## 2011-12-01 NOTE — Patient Instructions (Addendum)
Your physician recommends that you schedule a follow-up appointment in: 8 weeks  Your physician has recommended you make the following change in your medication: For a weight of 254# or higher, take Lasix 80mg  twice daily until it goes down to 251#.  For 253# or less, take 40mg  of lasix twice daily  Your physician recommends that you return for lab work in: today (bmet)

## 2011-12-01 NOTE — Assessment & Plan Note (Signed)
The patient is mildly fluid overloaded today. I spent a great deal of time talking with him and his wife about learning how to titrate his diuretics at home. He is weighing himself daily. Recently we increase his Lasix to 80 twice a day and he diaries down to 251 pounds on his scale. This is a good weight for him. Today I have instructed him to take 40 mg twice a day. If his weight goes up to 254 or higher he will take 80 mg twice a day until his weight comes back down to 251. Of course as always I've instructed him to keep his feet elevated when he is not active. I've also instructed him to limit his salt intake and limit his total fluid intake.

## 2011-12-05 NOTE — Telephone Encounter (Signed)
[name] states his weight is 255# today, up from 252# on Sat and Sun.  He states his edema is increased but sob is unchanged.  He will follow Dr Henrietta Hoover plan and increase his lasix to 80mg  bid until his weight is back down to 251.

## 2011-12-06 NOTE — Telephone Encounter (Signed)
Weight=254#.  Swelling is ok and sob is unchanged.  BP=11/77 P=83 about an hour after bp meds.  BP=125/90 and P=72 around 2:30pm.

## 2011-12-06 NOTE — Telephone Encounter (Signed)
N/A.  LMTC. 

## 2011-12-07 NOTE — Telephone Encounter (Signed)
N/A.  LMTC. 

## 2011-12-07 NOTE — Telephone Encounter (Signed)
Wt=254#.  Edema and sob unchanged.  He will continue the increased dose of lasix (80mg  bid) per Dr Myrtis Ser.

## 2011-12-09 LAB — REMOTE ICD DEVICE
AL IMPEDENCE ICD: 494 Ohm
ATRIAL PACING ICD: 0 pct
CHARGE TIME: 9.259 s
FVT: 0
PACEART VT: 0
TOT-0001: 1
TOT-0002: 0
TZAT-0001ATACH: 3
TZAT-0001FASTVT: 1
TZAT-0005FASTVT: 88 pct
TZAT-0012ATACH: 150 ms
TZAT-0012ATACH: 150 ms
TZAT-0012ATACH: 150 ms
TZAT-0012SLOWVT: 200 ms
TZAT-0012SLOWVT: 200 ms
TZAT-0013FASTVT: 1
TZAT-0013SLOWVT: 2
TZAT-0013SLOWVT: 2
TZAT-0018FASTVT: NEGATIVE
TZAT-0019ATACH: 6 V
TZAT-0019FASTVT: 8 V
TZAT-0019SLOWVT: 8 V
TZAT-0019SLOWVT: 8 V
TZAT-0020ATACH: 1.5 ms
TZAT-0020ATACH: 1.5 ms
TZAT-0020FASTVT: 1.5 ms
TZAT-0020SLOWVT: 1.5 ms
TZAT-0020SLOWVT: 1.5 ms
TZON-0003ATACH: 350 ms
TZON-0003FASTVT: 240 ms
TZON-0003SLOWVT: 340 ms
TZON-0003VSLOWVT: 450 ms
TZON-0004SLOWVT: 24
TZST-0001ATACH: 4
TZST-0001ATACH: 5
TZST-0001ATACH: 6
TZST-0001FASTVT: 2
TZST-0001FASTVT: 5
TZST-0001SLOWVT: 5
TZST-0002ATACH: NEGATIVE
TZST-0002ATACH: NEGATIVE
TZST-0003FASTVT: 35 J
TZST-0003FASTVT: 35 J
TZST-0003SLOWVT: 25 J
TZST-0003SLOWVT: 35 J
VENTRICULAR PACING ICD: 64.52 pct

## 2011-12-09 NOTE — Telephone Encounter (Signed)
Weight=253#.  Sob and edema unchanged.  BP=123/83.  P=80.

## 2011-12-13 NOTE — Telephone Encounter (Signed)
Wt= 253#.  BP=140/96   BP middle afternoon=126/88

## 2011-12-14 ENCOUNTER — Encounter: Payer: Self-pay | Admitting: *Deleted

## 2011-12-14 NOTE — Telephone Encounter (Signed)
Weight today=253#.  Swelling is better and sob is unchanged.  BP=140/95.  P=80.

## 2011-12-20 HISTORY — PX: SPINAL CORD STIMULATOR IMPLANT: SHX2422

## 2011-12-21 ENCOUNTER — Other Ambulatory Visit: Payer: Medicare Other

## 2011-12-22 ENCOUNTER — Ambulatory Visit: Payer: Medicare Other | Admitting: Internal Medicine

## 2011-12-26 ENCOUNTER — Telehealth: Payer: Self-pay | Admitting: *Deleted

## 2011-12-26 NOTE — Telephone Encounter (Signed)
Spouse calls to inform us that pt has been off his coumadin from 12/18/11 to 12/24/11 for spinal surgery that he had on 12/23/11. He is on Hydromorphone and Clindamyacin. Appt R/S  For 9 days post restart.

## 2011-12-28 ENCOUNTER — Other Ambulatory Visit: Payer: Medicare Other

## 2011-12-28 NOTE — Telephone Encounter (Signed)
Pt states his weight is 253# today.  Increased swelling in le which started from his knee on Saturday (10/4).  Slightly increased sob.  Pt feels his swelling is from his gout and has an appt with Ortho today.  He will report back after the appt.  Had pain stimulator placed in his spine and states he is doing well.  Continuing lasix 80mg  bid.

## 2011-12-29 ENCOUNTER — Ambulatory Visit: Payer: Medicare Other | Admitting: Internal Medicine

## 2011-12-29 ENCOUNTER — Other Ambulatory Visit: Payer: Self-pay

## 2011-12-29 ENCOUNTER — Other Ambulatory Visit (INDEPENDENT_AMBULATORY_CARE_PROVIDER_SITE_OTHER): Payer: Medicare Other

## 2011-12-29 DIAGNOSIS — I509 Heart failure, unspecified: Secondary | ICD-10-CM

## 2011-12-29 LAB — BASIC METABOLIC PANEL
Chloride: 97 mEq/L (ref 96–112)
GFR: 56.88 mL/min — ABNORMAL LOW (ref >60.00)
Potassium: 3.5 mEq/L (ref 3.5–5.1)

## 2011-12-29 LAB — CBC WITH DIFFERENTIAL/PLATELET
Basophils Absolute: 0 10*3/uL (ref 0.0–0.1)
Eosinophils Absolute: 0 10*3/uL (ref 0.0–0.7)
Lymphocytes Relative: 6.9 % — ABNORMAL LOW (ref 12.0–46.0)
MCHC: 32.5 g/dL (ref 30.0–36.0)
Monocytes Absolute: 0.8 10*3/uL (ref 0.1–1.0)
Neutrophils Relative %: 85.3 % — ABNORMAL HIGH (ref 43.0–77.0)
Platelets: 220 10*3/uL (ref 150.0–400.0)
RBC: 4.85 Mil/uL (ref 4.22–5.81)
RDW: 13.9 % (ref 11.5–14.6)

## 2011-12-29 NOTE — Telephone Encounter (Signed)
Pt reports that his weight is 252# today but still has a lot of swelling bil legs from foot to thigh.  Legs are "tight".   Sob is slightly worse than yesterday.  He is also reporting a decreased urine output.  BP=114/93.  P=76.  Current lasix dose is 80mg  bid.

## 2011-12-30 ENCOUNTER — Telehealth: Payer: Self-pay | Admitting: Cardiovascular Disease

## 2011-12-30 ENCOUNTER — Ambulatory Visit (INDEPENDENT_AMBULATORY_CARE_PROVIDER_SITE_OTHER): Payer: Medicare Other | Admitting: Cardiovascular Disease

## 2011-12-30 ENCOUNTER — Encounter: Payer: Self-pay | Admitting: Cardiovascular Disease

## 2011-12-30 ENCOUNTER — Ambulatory Visit (INDEPENDENT_AMBULATORY_CARE_PROVIDER_SITE_OTHER): Payer: Medicare Other | Admitting: *Deleted

## 2011-12-30 VITALS — BP 133/91 | HR 90 | Ht 72.0 in | Wt 252.0 lb

## 2011-12-30 DIAGNOSIS — I509 Heart failure, unspecified: Secondary | ICD-10-CM

## 2011-12-30 DIAGNOSIS — I5023 Acute on chronic systolic (congestive) heart failure: Secondary | ICD-10-CM

## 2011-12-30 DIAGNOSIS — I4891 Unspecified atrial fibrillation: Secondary | ICD-10-CM

## 2011-12-30 DIAGNOSIS — Z7901 Long term (current) use of anticoagulants: Secondary | ICD-10-CM

## 2011-12-30 LAB — POCT INR: INR: 1.5

## 2011-12-30 NOTE — Telephone Encounter (Signed)
Pt wife [phone] returning call to triage Nurse D. Leffler

## 2011-12-30 NOTE — Progress Notes (Signed)
History of Present Illness: 71 yo male with complex medical history as below including CAD, ischemic CM, chronic systolic CHF who is added onto my schedule today as the DOD for evaluation of LE edema/SOB. He is followed closely by Dr. Myrtis Ser. He called in yesterday and c/o increased SOB and LE edema but weights are stable. He had a spinal stimulator implanted one week ago. His fluid overload seemed to start then. He felt poorly following the procedure last week but has slowly started feeling better. He and his wife note his LE edema is much worse since his procedure last week. His Lasix dose has been increased to 120 mg po BID starting this am and he has noticed an increase in his urine output over the last 8 hours. No chest pain or change in breathing. Swelling is better in the thighs over last few days.   Primary Care Physician: Alwyn Ren  Last Lipid Profile:Lipid Panel     Component Value Date/Time   CHOL 166 12/17/2010 1034   TRIG 110.0 12/17/2010 1034   HDL 60.70 12/17/2010 1034   CHOLHDL 3 12/17/2010 1034   VLDL 22.0 12/17/2010 1034   LDLCALC 83 12/17/2010 1034     Past Medical History  Diagnosis Date  . Diabetes mellitus   . Hyperlipidemia   . Hypertension   . Pilonidal cyst   . Atrial fibrillation     Previous long-term amiodarone therapy with multiple cardioversions / amiodarone stopped September, 2009  . Atrial flutter     Started November, 2010, Left-sided and cannot ablate  . Left atrial thrombus     Remote past... cardioversions done since that time  . Wide-complex tachycardia   . Left ventricular ejection fraction less than 40%   . Gout   . AAA (abdominal aortic aneurysm)     Surgical repair  . Discolored skin   . S/P ICD (internal cardiac defibrillator) procedure     Dr. Ladona Ridgel 2009... by the pacing  . SOB (shortness of breath)     Large left effusion/ thoracentesis/hospitalization/November, 2011... exudated.. cytology negative.. Dr.Wert.. no proof of mesothelioma  .  Pericardial effusion   . Pleural effusion     Large loculated effusion on the left side November, 2011. This was tapped. It was exudative. Cytology revealed no cancer no proof of mesothelioma area pulmonary team felt that no further workup was needed  . S/P AAA repair   . Spinal stenosis     Surgery Dr.Elsner  . CAD (coronary artery disease)     Catheterization July, 2008... name and vein grafts patent but low cardiac output  . Warfarin anticoagulation   . Cardiomyopathy     Ischemic... ICD  . CHF (congestive heart failure)     EF 30-40%... echo.. November, 2008  /  EF 55-60% echo... November, 2011  . Venous insufficiency     Toe discoloration chronic  . Mitral regurgitation     Mild echo  . Aortic valve sclerosis   . Nasal drainage     Chronic  . Alcohol ingestion of more than four drinks per week     Excess beer  not a dependency problem  . Chronotropic incompetence     IV pacing rate adjusted  . Thrombophlebitis of superficial veins of upper extremities     Possible venous stenosis from defibrillator  . Pericardial effusion     November, 2011 .. decreased during hospitalization  . Eye abnormality     Ophthalmologist questions a clot in one of his  eyes, May, 2012  . Overweight     November, 2012  . Pleural thickening   . Ejection fraction < 50%     Ejection fraction has varied over time from 35-50%.,, Echoes are technically very difficult,,, EF 50%, echo, May 25, 2011, technically very difficult  . Drug therapy     Redness and swelling with Avelox infusion May 24, 2011  . COPD (chronic obstructive pulmonary disease)   . Myocardial infarction     Past Surgical History  Procedure Date  . Colonoscopy w/ polypectomy   . Icd      insertion  . Lumbar fusion   . Pilonidal cyst removal   . Surgery scrotal / testicular   . Coronary artery bypass graft 2004  . Abdominal aortic aneurysm repair 11/2002    Current Outpatient Prescriptions  Medication Sig Dispense Refill    . ACCU-CHEK SOFTCLIX LANCETS lancets USE AS DIRECTED  100 each  4  . amLODipine (NORVASC) 5 MG tablet Take 5 mg by mouth daily.      Marland Kitchen aspirin 81 MG tablet Take 81 mg by mouth once a week.       . carvedilol (COREG) 25 MG tablet Take 12.5 mg by mouth 2 (two) times daily with a meal.       . cholecalciferol (VITAMIN D) 1000 UNITS tablet Take 1,000 Units by mouth daily.      . colchicine 0.6 MG tablet Take 1 tablet (0.6 mg total) by mouth daily as needed. For gout  30 tablet  1  . furosemide (LASIX) 40 MG tablet Take 2 tablets (80mg ) twice daily for a weight of 254# or higher.  Take 1 tablet twice daily for a weight of 251# or lower.  120 tablet  11  . gabapentin (NEURONTIN) 100 MG capsule Take 100 mg by mouth 2 (two) times daily.      Marland Kitchen glimepiride (AMARYL) 2 MG tablet Take 2 mg by mouth. 1/2 by mouth daily      . glucose blood (ACCU-CHEK COMFORT CURVE) test strip Use as instructed  100 each  4  . ipratropium (ATROVENT) 0.06 % nasal spray Place 1 spray into the nose daily.       . methocarbamol (ROBAXIN) 500 MG tablet Take 500 mg by mouth 2 (two) times daily.       . nitroGLYCERIN (NITROSTAT) 0.4 MG SL tablet Place 0.4 mg under the tongue every 5 (five) minutes as needed.        . potassium chloride (KLOR-CON) 20 MEQ packet Take 10 mEq by mouth 2 (two) times daily.       . simvastatin (ZOCOR) 40 MG tablet Take 1 tablet (40 mg total) by mouth at bedtime.  90 tablet  1  . Thiamine HCl (B-1 PO) Take 125 mcg by mouth daily.      Marland Kitchen DISCONTD: potassium chloride SA (KLOR-CON M20) 20 MEQ tablet Take 1 tablet (20 mEq total) by mouth daily.  90 tablet  3    Allergies  Allergen Reactions  . Avelox (Moxifloxacin Hcl In Nacl) Swelling, Rash and Other (See Comments)    Patient became hypotensive after infusion started  . Penicillins     REACTION: anaphylaxis    History   Social History  . Marital Status: Married    Spouse Name: N/A    Number of Children: N/A  . Years of Education: N/A    Occupational History  . Not on file.   Social History Main Topics  . Smoking  status: Former Smoker    Quit date: 03/22/1995  . Smokeless tobacco: Former Neurosurgeon  . Alcohol Use: 0.0 oz/week    21-28 Cans of beer per week     beer  . Drug Use: No  . Sexually Active: Not on file   Other Topics Concern  . Not on file   Social History Narrative  . No narrative on file    Family History  Problem Relation Age of Onset  . Hypertension Mother   . Stroke Mother   . Diabetes Father   . Coronary artery disease Father   . Other Father     DVT    Review of Systems:  As stated in the HPI and otherwise negative.   BP 133/91  Pulse 90  Ht 6' (1.829 m)  Wt 252 lb (114.306 kg)  BMI 34.18 kg/m2  Physical Examination: General: Well developed, well nourished, NAD HEENT: OP clear, mucus membranes moist SKIN: warm, dry. No rashes. Neuro: No focal deficits Musculoskeletal: Muscle strength 5/5 all ext Psychiatric: Mood and affect normal Neck: No JVD, no carotid bruits, no thyromegaly, no lymphadenopathy. Lungs:Clear bilaterally, no wheezes, rhonci, crackles Cardiovascular: Regular rate and rhythm. No murmurs, gallops or rubs. Abdomen:Soft. Bowel sounds present. Non-tender.  Extremities: 2+ bilateral lower extremity edema. Pulses are difficult to palpate given edema.   EKG: V-paced. 93 bpm  Assessment and Plan:   1. Acute on chronic systolic CHF: Likely exacerbated by recent volume loading with surgical procedure. Lower ext edema improving with increased dose of Lasix. Will continue Lasix 120 mg po BID for next three days then have f/u BMET on Monday. He will let us know Monday how he feels, how his LE edema is responding and what his weight is. I do not think this represents DVT.

## 2011-12-30 NOTE — Telephone Encounter (Signed)
Pt called to report that his weight is 247# today.  No change in edema or sob.  BP=124/86.  P=93.

## 2011-12-30 NOTE — Telephone Encounter (Signed)
Pt told to increase lasix to 120mg  bid per Dr Myrtis Ser.  Appt scheduled with Dr Clifton James 12/30/11.

## 2011-12-30 NOTE — Patient Instructions (Addendum)
Take furosemide 120 mg by mouth twice daily today, tomorrow and Sunday. Come in to office on Monday --January 02, 2012 for lab work. (BMP). Bring readings of weights over weekend and Monday AM to Greig Castilla, RN with you on Monday

## 2011-12-30 NOTE — Telephone Encounter (Signed)
Wt=247# this am.  Swelling the same as yesterday.  BP=124/86.  P=93.  Pt has appt today.

## 2012-01-02 ENCOUNTER — Ambulatory Visit (INDEPENDENT_AMBULATORY_CARE_PROVIDER_SITE_OTHER): Payer: Medicare Other | Admitting: Cardiology

## 2012-01-02 ENCOUNTER — Encounter: Payer: Self-pay | Admitting: Cardiology

## 2012-01-02 ENCOUNTER — Other Ambulatory Visit (INDEPENDENT_AMBULATORY_CARE_PROVIDER_SITE_OTHER): Payer: Medicare Other

## 2012-01-02 VITALS — BP 126/84 | HR 82 | Resp 12 | Ht 72.0 in | Wt 247.0 lb

## 2012-01-02 DIAGNOSIS — R0989 Other specified symptoms and signs involving the circulatory and respiratory systems: Secondary | ICD-10-CM

## 2012-01-02 DIAGNOSIS — I509 Heart failure, unspecified: Secondary | ICD-10-CM

## 2012-01-02 DIAGNOSIS — Z9689 Presence of other specified functional implants: Secondary | ICD-10-CM

## 2012-01-02 DIAGNOSIS — Z9889 Other specified postprocedural states: Secondary | ICD-10-CM

## 2012-01-02 DIAGNOSIS — I5023 Acute on chronic systolic (congestive) heart failure: Secondary | ICD-10-CM

## 2012-01-02 LAB — BASIC METABOLIC PANEL
BUN: 22 mg/dL (ref 6–23)
Chloride: 102 mEq/L (ref 96–112)
GFR: 52.71 mL/min — ABNORMAL LOW (ref >60.00)
Glucose, Bld: 152 mg/dL — ABNORMAL HIGH (ref 70–99)
Potassium: 3.4 mEq/L — ABNORMAL LOW (ref 3.5–5.1)
Sodium: 140 mEq/L (ref 135–145)

## 2012-01-02 MED ORDER — FUROSEMIDE 40 MG PO TABS: ORAL_TABLET | ORAL | Status: DC

## 2012-01-02 NOTE — Assessment & Plan Note (Signed)
So far this appears to be helping. We are all hopeful that this will lead to his ability to have increased activities which will help his overall cardiac status.

## 2012-01-02 NOTE — Assessment & Plan Note (Signed)
Chemistry level be checked today.

## 2012-01-02 NOTE — Patient Instructions (Addendum)
Your physician recommends that you return for lab work in: today (bmet)  Your physician recommends that you schedule a follow-up appointment in: as scheduled.  Your physician has recommended you make the following change in your medication: STAY on Lasix 80mg  twice daily

## 2012-01-02 NOTE — Assessment & Plan Note (Signed)
With the dosing of Lasix and 120 twice a day the patient has diuresis. I also gave him chlorthalidone with some of the doses. I am hopeful that his renal function is stable today. We will call him daily in an attempt to keep his volume status stable. It is of note that he finally commented that he has cut back on his beer drinking. I explained him that this was important for so many reasons including alcohol intake. However in his case is also very important for total fluid intake. I told him this many times in the past but I believe he's finally listening. He does not have a problem with alcoholic behavior per se.

## 2012-01-02 NOTE — Telephone Encounter (Signed)
Weight today=242#.  Edema is better (none) and sob is better.  He will take Lasix 80mg  bid per Dr Myrtis Ser.

## 2012-01-02 NOTE — Progress Notes (Signed)
Patient ID: [name], male   DOB: 1940/06/23, 71 y.o.   MRN: [id number]   HPI  Patient is seen today to followup his fluid status. The patient had gained additional fluid weight and was in touch by telephone. I encouraged him to increase his Lasix to 120 by mouth twice a day along with chlorthalidone. I arranged for him to speech seen by our doctor the day. This was done and fortunately he was stabilizing. He was beginning to diuresis. It was felt that he probably had some volume overload and then possibly had more volume at the time that he had I nerve stimulator implanted in his back. He was instructed to take a few more days of Lasix 120 twice a day. This was to be changed back to 80 twice a day starting this morning. His weight has come down significantly. His legs look much better. He is feeling better. The spinal stimulator appears to be helping.  Allergies  Allergen Reactions  . Avelox (Moxifloxacin Hcl In Nacl) Swelling, Rash and Other (See Comments)    Patient became hypotensive after infusion started  . Penicillins     REACTION: anaphylaxis    Current Outpatient Prescriptions  Medication Sig Dispense Refill  . ACCU-CHEK SOFTCLIX LANCETS lancets USE AS DIRECTED  100 each  4  . amLODipine (NORVASC) 5 MG tablet Take 5 mg by mouth daily.      Marland Kitchen aspirin 81 MG tablet Take 81 mg by mouth once a week.       . carvedilol (COREG) 25 MG tablet Take 12.5 mg by mouth 2 (two) times daily with a meal.       . cholecalciferol (VITAMIN D) 1000 UNITS tablet Take 1,000 Units by mouth daily.      . clindamycin (CLEOCIN) 300 MG capsule AS DIRECTED      . colchicine 0.6 MG tablet Take 1 tablet (0.6 mg total) by mouth daily as needed. For gout  30 tablet  1  . furosemide (LASIX) 40 MG tablet Take 2 tablets (80mg ) twice daily for a weight of 254# or higher.  Take 1 tablet twice daily for a weight of 251# or lower.  160 tablet  11  . gabapentin (NEURONTIN) 100 MG capsule Take 100 mg by mouth 2  (two) times daily.      Marland Kitchen glimepiride (AMARYL) 2 MG tablet TAKE 1/2 TAB TWICE A DAY      . glucose blood (ACCU-CHEK COMFORT CURVE) test strip Use as instructed  100 each  4  . HYDROmorphone (DILAUDID) 4 MG tablet BACK SURGERY      . ipratropium (ATROVENT) 0.06 % nasal spray Place 1 spray into the nose daily.       . methocarbamol (ROBAXIN) 500 MG tablet Take 500 mg by mouth 2 (two) times daily.       . nitroGLYCERIN (NITROSTAT) 0.4 MG SL tablet Place 0.4 mg under the tongue every 5 (five) minutes as needed.        . potassium chloride (KLOR-CON) 20 MEQ packet Take 10 mEq by mouth 2 (two) times daily.       . simvastatin (ZOCOR) 40 MG tablet Take 1 tablet (40 mg total) by mouth at bedtime.  90 tablet  1  . Thiamine HCl (B-1 PO) Take 125 mcg by mouth daily.      Marland Kitchen warfarin (COUMADIN) 2.5 MG tablet AS DIRECTED      . DISCONTD: furosemide (LASIX) 40 MG tablet Take 2 tablets (80mg ) twice daily  for a weight of 254# or higher.  Take 1 tablet twice daily for a weight of 251# or lower.  120 tablet  11  . DISCONTD: potassium chloride SA (KLOR-CON M20) 20 MEQ tablet Take 1 tablet (20 mEq total) by mouth daily.  90 tablet  3    History   Social History  . Marital Status: Married    Spouse Name: N/A    Number of Children: N/A  . Years of Education: N/A   Occupational History  . Not on file.   Social History Main Topics  . Smoking status: Former Smoker    Quit date: 03/22/1995  . Smokeless tobacco: Former Neurosurgeon  . Alcohol Use: 0.0 oz/week    21-28 Cans of beer per week     beer  . Drug Use: No  . Sexually Active: Not on file   Other Topics Concern  . Not on file   Social History Narrative  . No narrative on file    Family History  Problem Relation Age of Onset  . Hypertension Mother   . Stroke Mother   . Diabetes Father   . Coronary artery disease Father   . Other Father     DVT    Past Medical History  Diagnosis Date  . Diabetes mellitus   . Hyperlipidemia   . Hypertension    . Pilonidal cyst   . Atrial fibrillation     Previous long-term amiodarone therapy with multiple cardioversions / amiodarone stopped September, 2009  . Atrial flutter     Started November, 2010, Left-sided and cannot ablate  . Left atrial thrombus     Remote past... cardioversions done since that time  . Wide-complex tachycardia   . Left ventricular ejection fraction less than 40%   . Gout   . AAA (abdominal aortic aneurysm)     Surgical repair  . Discolored skin   . S/P ICD (internal cardiac defibrillator) procedure     Dr. Ladona Ridgel 2009... by the pacing  . SOB (shortness of breath)     Large left effusion/ thoracentesis/hospitalization/November, 2011... exudated.. cytology negative.. Dr.Wert.. no proof of mesothelioma  . Pericardial effusion   . Pleural effusion     Large loculated effusion on the left side November, 2011. This was tapped. It was exudative. Cytology revealed no cancer no proof of mesothelioma area pulmonary team felt that no further workup was needed  . S/P AAA repair   . Spinal stenosis     Surgery Dr.Elsner  . CAD (coronary artery disease)     Catheterization July, 2008... name and vein grafts patent but low cardiac output  . Warfarin anticoagulation   . Cardiomyopathy     Ischemic... ICD  . CHF (congestive heart failure)     EF 30-40%... echo.. November, 2008  /  EF 55-60% echo... November, 2011  . Venous insufficiency     Toe discoloration chronic  . Mitral regurgitation     Mild echo  . Aortic valve sclerosis   . Nasal drainage     Chronic  . Alcohol ingestion of more than four drinks per week     Excess beer  not a dependency problem  . Chronotropic incompetence     IV pacing rate adjusted  . Thrombophlebitis of superficial veins of upper extremities     Possible venous stenosis from defibrillator  . Pericardial effusion     November, 2011 .. decreased during hospitalization  . Eye abnormality     Ophthalmologist questions a  clot in one of his  eyes, May, 2012  . Overweight     November, 2012  . Pleural thickening   . Ejection fraction < 50%     Ejection fraction has varied over time from 35-50%.,, Echoes are technically very difficult,,, EF 50%, echo, May 25, 2011, technically very difficult  . Drug therapy     Redness and swelling with Avelox infusion May 24, 2011  . COPD (chronic obstructive pulmonary disease)   . Myocardial infarction   . Spinal cord stimulator status     October, 2013    Past Surgical History  Procedure Date  . Colonoscopy w/ polypectomy   . Icd      insertion  . Lumbar fusion   . Pilonidal cyst removal   . Surgery scrotal / testicular   . Coronary artery bypass graft 2004  . Abdominal aortic aneurysm repair 11/2002    Patient Active Problem List  Diagnosis  . DIABETES MELLITUS, CONTROLLED  . GOUT  . PERIPHERAL NEUROPATHY  . Acute on chronic systolic heart failure  . DVT  . DIVERTICULOSIS, COLON  . CKD (chronic kidney disease), stage III  . 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 BACK PAIN, LUMBAR  . CORONARY ARTERY BYPASS GRAFT, HX OF  . Hyperlipidemia  . Hypertension  . Atrial flutter  . Left atrial thrombus  . AAA (abdominal aortic aneurysm)  . S/P ICD (internal cardiac defibrillator) procedure  . Warfarin anticoagulation  . Paroxysmal ventricular tachycardia  . Shortness of breath  . Atrial fibrillation  . Wide-complex tachycardia  . Spinal stenosis  . CAD (coronary artery disease)  . Cardiomyopathy  . CHF (congestive heart failure)  . Venous insufficiency  . Mitral regurgitation  . Aortic valve sclerosis  . Alcohol ingestion of more than four drinks per week  . Chronotropic incompetence  . Thrombophlebitis of superficial veins of upper extremities  . Pericardial effusion  . Overweight  . Pleural thickening  . Pleural effusion  . Ejection fraction < 50%  . Drug therapy  . Encounter for long-term (current) use of anticoagulants  . Renal insufficiency  .  Edema  . Peripheral vascular disease with claudication  . Spinal cord stimulator status    ROS  Patient denies fever, chills, headache, sweats, rash, change in hearing, change in vision, chest pain, cough, nausea vomiting, urinary symptoms. All other systems are reviewed and are negative.  PHYSICAL EXAM  Patient is here with his wife. He looks much better. His edema in his legs is gone. There is no jugular venous distention. Lungs are clear. Respiratory effort is nonlabored. Cardiac exam reveals S1 and S2. There are no clicks or significant murmurs. The abdomen is soft. There is no peripheral edema. He has metal staples in his back in the areas where the nerve stimulator was placed.  Filed Vitals:   01/02/12 1209  BP: 126/84  Pulse: 82  Resp: 12  Height: 6' (1.829 m)  Weight: 247 lb (112.038 kg)    ASSESSMENT & PLAN

## 2012-01-03 ENCOUNTER — Telehealth: Payer: Self-pay | Admitting: Cardiology

## 2012-01-03 NOTE — Telephone Encounter (Signed)
Weight=240# today.  Edema significantly better.  Sob is better today also.  BP=130/89 and P=80.  Pt feels well.

## 2012-01-03 NOTE — Telephone Encounter (Signed)
Correction, weight-240# today.

## 2012-01-03 NOTE — Telephone Encounter (Signed)
Weight=204# today.

## 2012-01-03 NOTE — Telephone Encounter (Signed)
Pt wife returning nurse DL call, she can be reached at hm#

## 2012-01-04 NOTE — Telephone Encounter (Signed)
Weight today=239#.  Slight edema and slight sob.  Feeling much better.  BP=122/82  P=70.

## 2012-01-05 NOTE — Telephone Encounter (Signed)
[name] states his weight is: 240# today.  BP=111/76.  P=78.

## 2012-01-09 NOTE — Telephone Encounter (Signed)
Weight=143# today.  Edema and sob are better per pt.

## 2012-01-10 NOTE — Telephone Encounter (Signed)
Weight=242# today.  Edema and sob are better.  BP=127/87.  P=72.

## 2012-01-11 ENCOUNTER — Other Ambulatory Visit (INDEPENDENT_AMBULATORY_CARE_PROVIDER_SITE_OTHER): Payer: Medicare Other

## 2012-01-11 DIAGNOSIS — IMO0001 Reserved for inherently not codable concepts without codable children: Secondary | ICD-10-CM

## 2012-01-11 DIAGNOSIS — M109 Gout, unspecified: Secondary | ICD-10-CM

## 2012-01-11 LAB — MICROALBUMIN / CREATININE URINE RATIO
Creatinine,U: 97.2 mg/dL
Microalb Creat Ratio: 3.1 mg/g (ref 0.0–30.0)

## 2012-01-11 NOTE — Telephone Encounter (Signed)
Weight=242#.  Edema down and sob better.  BP=146/94.  P=77.

## 2012-01-12 ENCOUNTER — Encounter: Payer: Self-pay | Admitting: Internal Medicine

## 2012-01-12 ENCOUNTER — Ambulatory Visit (INDEPENDENT_AMBULATORY_CARE_PROVIDER_SITE_OTHER): Payer: Medicare Other | Admitting: Internal Medicine

## 2012-01-12 VITALS — BP 114/68 | HR 80 | Wt 247.2 lb

## 2012-01-12 DIAGNOSIS — E785 Hyperlipidemia, unspecified: Secondary | ICD-10-CM

## 2012-01-12 DIAGNOSIS — M109 Gout, unspecified: Secondary | ICD-10-CM

## 2012-01-12 DIAGNOSIS — Z23 Encounter for immunization: Secondary | ICD-10-CM

## 2012-01-12 DIAGNOSIS — E119 Type 2 diabetes mellitus without complications: Secondary | ICD-10-CM

## 2012-01-12 MED ORDER — ROSUVASTATIN CALCIUM 20 MG PO TABS: 20.0000 mg | ORAL_TABLET | Freq: Every day | ORAL | Status: DC

## 2012-01-12 MED ORDER — ALLOPURINOL 100 MG PO TABS: ORAL_TABLET | ORAL | Status: DC

## 2012-01-12 MED ORDER — SIMVASTATIN 40 MG PO TABS: 40.0000 mg | ORAL_TABLET | Freq: Every day | ORAL | Status: DC

## 2012-01-12 NOTE — Patient Instructions (Addendum)
Calcium channel blocker blood pressure  /cardiac medications such as  amlodipine, diltiazem, or verapamil IN COMBINATION with  Statin cholesterol lowering medications such as Lipitor or Zocor  could possibly increase risk of muscle insult. Please  schedule fasting Labs in 10 weeks after medication  changes: CK,Lipids, hepatic panel, uric acid, BMET.PLEASE BRING THESE INSTRUCTIONS TO FOLLOW UP  LAB APPOINTMENT.This will guarantee correct labs are drawn, eliminating need for repeat blood sampling ( needle sticks ! ). Diagnoses /Codes: 272.4,995.20,274.9,401.9

## 2012-01-12 NOTE — Telephone Encounter (Signed)
Weight = 241# today.  Edema and sob are under good control per pt.  Mrs Catbagan states that Mr Glendenning was switched to Rosuvastatin today and was placed on Allopurinol 50mg .

## 2012-01-12 NOTE — Addendum Note (Signed)
Addended by: Maurice Small on: 01/12/2012 05:44 PM   Modules accepted: Orders

## 2012-01-12 NOTE — Assessment & Plan Note (Signed)
Uric acid is improving with nutritional changes; minimal goal is less than 7, preferred is less than 6 ideal less than 5 to prevent gout. Low-dose allopurinol will be initiated.

## 2012-01-12 NOTE — Assessment & Plan Note (Signed)
A1c goal will be 8; A1c will be monitored every 4 months with renal function.

## 2012-01-12 NOTE — Assessment & Plan Note (Signed)
Crestor 20 mg will be initiated with repeat lipids in 10 weeks.

## 2012-01-12 NOTE — Progress Notes (Signed)
Subjective:    Patient ID: [name], male    DOB: July 10, 1940, 71 y.o.   MRN: [id number]  HPI Diabetes status assessment: Fasting or morning glucose  average :  150s  . Highest glucose 2 hours after any meal:  Not checked. Hypoglycemia :  no .                                                                                                                                                                                       Exercise : can't due to back issues . Nutrition/diet:  Low fat , minimal sugar. Medication compliance : yes. Medication adverse  Effects:  no . Eye exam : 7/13. Foot care : overdue.  A1c/ urine microalbumin monitor:  7.2%/ 3       Review of Systems Excess thirst :no;  Excess hunger:  no ;  Excess urination:  no.                                  Lightheadedness with standing:  no. Chest pain:  no ; Palpitations :no ;  Pain in  calves with walking:  no. Non healing skin  ulcers or sores,especially over the feet:  no Numbness or tingling or burning in feet : nos                                                                                                                                           Significant change in  Weight : 20 # loss with diuretic. Vision changes : "slight changes"  .  Objective:   Physical Exam Gen.: Central weight excess. Alert, appropriate and cooperative throughout exam.  Eyes: No corneal or conjunctival inflammation noted.  Ears: Hearing is severely compromised.  Lungs: Normal respiratory effort; chest expands symmetrically. Lungs are clear to auscultation without rales, wheezes, or increased work of breathing.Decreased BS Heart: Slightly irregular rhythm; distant heart sounds.  Abdomen: Bowel sounds normal; abdomen soft and nontender. No masses, organomegaly . Ventral hernia noted.      Musculoskeletal/extremities: 1/2-1+ edema Chronic  Toe nail health  good. Vascular:  dorsalis pedis and  posterior tibial pulses are decreased. No bruits present. Neurologic: Alert and oriented x3.   Skin: Intact without suspicious lesions or rashes. Psych: Mood and affect are normal. Normally interactive                                                                                         Assessment & Plan:

## 2012-01-13 ENCOUNTER — Ambulatory Visit (INDEPENDENT_AMBULATORY_CARE_PROVIDER_SITE_OTHER): Payer: Medicare Other | Admitting: Pharmacist

## 2012-01-13 DIAGNOSIS — Z7901 Long term (current) use of anticoagulants: Secondary | ICD-10-CM

## 2012-01-13 DIAGNOSIS — I4891 Unspecified atrial fibrillation: Secondary | ICD-10-CM

## 2012-01-17 NOTE — Telephone Encounter (Signed)
[name] states he forgot to bring his scale to the beach.  He will be at the beach for a week and plans to purchase a new one.  He states he does not have any edema or sob.

## 2012-01-17 NOTE — Telephone Encounter (Signed)
N/A.  Out of town this week.

## 2012-01-18 NOTE — Telephone Encounter (Signed)
NA

## 2012-01-20 NOTE — Telephone Encounter (Signed)
Unable to reach pt yesterday or today.  LMTC.

## 2012-01-25 ENCOUNTER — Telehealth: Payer: Self-pay

## 2012-01-25 NOTE — Telephone Encounter (Signed)
[name] returned from the beach last night.  His weight yesterday was 241#.  Today it is also 241#.  slight edema.  Sob is "good".  BP=148/95.  Pulse=68.

## 2012-01-26 NOTE — Telephone Encounter (Signed)
Weight today=241#.  Edema is slight as is sob.  BP=138/87.  P=71.  Mrs Mckellips states that he feels better.

## 2012-01-30 NOTE — Telephone Encounter (Signed)
Mr Mey states his weight is 242# today.  BP=140/10.  P=75.  Edema and sob are unchanged.

## 2012-01-31 NOTE — Telephone Encounter (Signed)
N/A.  LMTC. 

## 2012-01-31 NOTE — Telephone Encounter (Signed)
Weight=240#.  Very little edema and sob.  He describes his legs as being "skinny".  BP=144/94.  P=82.

## 2012-02-01 NOTE — Telephone Encounter (Signed)
Weight=240#.  Edema and sob doing well.  BP=136/92.  P=76.

## 2012-02-06 NOTE — Telephone Encounter (Signed)
Weight= 241# today.  He denies edema and states he has very little sob.  BP= 138/92.  P=70.

## 2012-02-07 NOTE — Telephone Encounter (Signed)
Weight= 241#.  BP=151/100.  P=86.

## 2012-02-08 ENCOUNTER — Telehealth: Payer: Self-pay | Admitting: Cardiology

## 2012-02-08 MED ORDER — POTASSIUM CHLORIDE CRYS ER 20 MEQ PO TBCR: 20.0000 meq | EXTENDED_RELEASE_TABLET | Freq: Every day | ORAL | Status: DC

## 2012-02-08 NOTE — Telephone Encounter (Signed)
Please call back

## 2012-02-08 NOTE — Telephone Encounter (Signed)
Requesting refill of K+.  Rx refilled

## 2012-02-08 NOTE — Telephone Encounter (Signed)
Weight=240#.  116/82. P=60.  No edema and denies sob.

## 2012-02-09 ENCOUNTER — Other Ambulatory Visit: Payer: Self-pay | Admitting: Internal Medicine

## 2012-02-09 NOTE — Telephone Encounter (Signed)
Weight=241#.  Slight edema.  No sob.  Bp=120/84.  P=65.

## 2012-02-09 NOTE — Telephone Encounter (Signed)
F/U   Returning call back to nurse.   

## 2012-02-13 ENCOUNTER — Ambulatory Visit (INDEPENDENT_AMBULATORY_CARE_PROVIDER_SITE_OTHER): Payer: Medicare Other | Admitting: *Deleted

## 2012-02-13 ENCOUNTER — Encounter: Payer: Self-pay | Admitting: Cardiology

## 2012-02-13 ENCOUNTER — Ambulatory Visit (INDEPENDENT_AMBULATORY_CARE_PROVIDER_SITE_OTHER): Payer: Medicare Other | Admitting: Cardiology

## 2012-02-13 VITALS — BP 154/93 | HR 86 | Ht 72.0 in | Wt 249.0 lb

## 2012-02-13 DIAGNOSIS — Z95810 Presence of automatic (implantable) cardiac defibrillator: Secondary | ICD-10-CM

## 2012-02-13 DIAGNOSIS — Z9889 Other specified postprocedural states: Secondary | ICD-10-CM

## 2012-02-13 DIAGNOSIS — I739 Peripheral vascular disease, unspecified: Secondary | ICD-10-CM

## 2012-02-13 DIAGNOSIS — I4891 Unspecified atrial fibrillation: Secondary | ICD-10-CM

## 2012-02-13 DIAGNOSIS — I10 Essential (primary) hypertension: Secondary | ICD-10-CM

## 2012-02-13 DIAGNOSIS — R0602 Shortness of breath: Secondary | ICD-10-CM

## 2012-02-13 DIAGNOSIS — I872 Venous insufficiency (chronic) (peripheral): Secondary | ICD-10-CM

## 2012-02-13 DIAGNOSIS — Z7901 Long term (current) use of anticoagulants: Secondary | ICD-10-CM

## 2012-02-13 DIAGNOSIS — I509 Heart failure, unspecified: Secondary | ICD-10-CM

## 2012-02-13 DIAGNOSIS — Z9689 Presence of other specified functional implants: Secondary | ICD-10-CM

## 2012-02-13 LAB — BASIC METABOLIC PANEL
BUN: 22 mg/dL (ref 6–23)
CO2: 29 mEq/L (ref 19–32)
Calcium: 8.8 mg/dL (ref 8.4–10.5)
Chloride: 102 mEq/L (ref 96–112)
Creatinine, Ser: 1.3 mg/dL (ref 0.4–1.5)
Glucose, Bld: 188 mg/dL — ABNORMAL HIGH (ref 70–99)

## 2012-02-13 LAB — POCT INR: INR: 3.9

## 2012-02-13 NOTE — Assessment & Plan Note (Signed)
He continues on Coumadin for his atrial arrhythmia. No change in therapy.

## 2012-02-13 NOTE — Patient Instructions (Addendum)
Your physician recommends that you schedule a follow-up appointment in: 6 weeks  Your physician has requested that you have a carotid duplex. This test is an ultrasound of the carotid arteries in your neck. It looks at blood flow through these arteries that supply the brain with blood. Allow one hour for this exam. There are no restrictions or special instructions.  Your physician recommends that you return for lab work in: today (bmet)

## 2012-02-13 NOTE — Assessment & Plan Note (Signed)
His blood pressure has been elevated in the past. At times he gets symptomatic with we lowered much. I've chosen not to change his medicines today. I will reassess this issue when I see him back next time.

## 2012-02-13 NOTE — Assessment & Plan Note (Signed)
He has significant venous insufficiency. However this is greatly improved with his support hose and a decrease in his edema.

## 2012-02-13 NOTE — Assessment & Plan Note (Signed)
His ICD is in place. He is pacing. This is stable. No further workup.

## 2012-02-13 NOTE — Assessment & Plan Note (Signed)
He is doing remarkably well with a spinal cord stimulator. No change in therapy.  As part of today's visit I spent greater than 25 minutes with his overall care. More than half of this time has been spent with direct contact with the patient and his wife talking about limiting his fluid, limiting his beer intake, keeping his feet elevated, making decisions about his high blood pressure and discuss and and several other issues.

## 2012-02-13 NOTE — Assessment & Plan Note (Signed)
He has good left ventricular function by his last echo in March, 2013. His diastolic heart failure is controlled at this time.

## 2012-02-13 NOTE — Assessment & Plan Note (Addendum)
He does have peripheral vascular disease. However his symptoms are greatly improved related to his back pain and he does not appear to be having any marked claudication at this time. I will plan for him to follow up with Dr. Edilia Bo in one year instead of 6 months.  Also I decided proceed with a carotid Doppler. There is question of a bruit. The patient has known vascular disease.

## 2012-02-13 NOTE — Progress Notes (Addendum)
HPI Patient is seen to followup fluid overload and hypertension and edema and coronary disease. He has had some persistent edema. There is a very nice consult note from Dr.Dickson in June, 2013. It was felt that he should continue his support hose. He was also felt that he probably was not having significant problems with arterial flow into his leg causing his problems. Since that time the patient has had marked improvement for many reasons. He is wearing his support hose. In addition he has cut down on the total fluid that he is drinking. In addition he has a Implanted spinal stimulator for his back that helps remarkably. With all of these things he is greatly improved. His blood pressure remained somewhat elevated at home.  Allergies  Allergen Reactions  . Avelox (Moxifloxacin Hcl In Nacl) Swelling, Rash and Other (See Comments)    Patient became hypotensive after infusion started  . Penicillins     REACTION: anaphylaxis    Current Outpatient Prescriptions  Medication Sig Dispense Refill  . ACCU-CHEK COMFORT CURVE test strip USE AS INSTRUCTED  100 each  3  . ACCU-CHEK SOFTCLIX LANCETS lancets USE AS DIRECTED  100 each  4  . allopurinol (ZYLOPRIM) 100 MG tablet 1/2 daily  30 tablet  6  . amLODipine (NORVASC) 5 MG tablet Take 5 mg by mouth daily.      Marland Kitchen aspirin 81 MG tablet Take 81 mg by mouth once a week.       . carvedilol (COREG) 25 MG tablet Take 12.5 mg by mouth 2 (two) times daily with a meal.       . cholecalciferol (VITAMIN D) 1000 UNITS tablet Take 1,000 Units by mouth daily.      . colchicine 0.6 MG tablet Take 1 tablet (0.6 mg total) by mouth daily as needed. For gout  30 tablet  1  . furosemide (LASIX) 40 MG tablet Take 2 tablets (80mg ) twice daily for a weight of 254# or higher.  Take 1 tablet twice daily for a weight of 251# or lower.  160 tablet  11  . gabapentin (NEURONTIN) 100 MG capsule Take 100 mg by mouth 2 (two) times daily.      Marland Kitchen glimepiride (AMARYL) 2 MG tablet Take  1 mg by mouth daily before breakfast. TAKE 1/2 TAB daily      . ipratropium (ATROVENT) 0.06 % nasal spray Place 1 spray into the nose daily.       . nitroGLYCERIN (NITROSTAT) 0.4 MG SL tablet Place 0.4 mg under the tongue every 5 (five) minutes as needed.        . potassium chloride (KLOR-CON) 20 MEQ packet Take 10 mEq by mouth 2 (two) times daily.       . potassium chloride SA (KLOR-CON M20) 20 MEQ tablet Take 1 tablet (20 mEq total) by mouth daily.  90 tablet  3  . rosuvastatin (CRESTOR) 20 MG tablet Take 1 tablet (20 mg total) by mouth daily.  30 tablet  2  . Thiamine HCl (B-1 PO) Take 125 mcg by mouth daily.      Marland Kitchen warfarin (COUMADIN) 2.5 MG tablet AS DIRECTED        History   Social History  . Marital Status: Married    Spouse Name: N/A    Number of Children: N/A  . Years of Education: N/A   Occupational History  . Not on file.   Social History Main Topics  . Smoking status: Former Smoker  Quit date: 03/22/1995  . Smokeless tobacco: Former Neurosurgeon  . Alcohol Use: 0.0 oz/week    21-28 Cans of beer per week     Comment: beer  . Drug Use: No  . Sexually Active: Not on file   Other Topics Concern  . Not on file   Social History Narrative  . No narrative on file    Family History  Problem Relation Age of Onset  . Hypertension Mother   . Stroke Mother   . Diabetes Father   . Coronary artery disease Father   . Other Father     DVT    Past Medical History  Diagnosis Date  . Diabetes mellitus   . Hyperlipidemia   . Hypertension   . Pilonidal cyst   . Atrial fibrillation     Previous long-term amiodarone therapy with multiple cardioversions / amiodarone stopped September, 2009  . Atrial flutter     Started November, 2010, Left-sided and cannot ablate  . Left atrial thrombus     Remote past... cardioversions done since that time  . Wide-complex tachycardia   . Left ventricular ejection fraction less than 40%   . Gout   . AAA (abdominal aortic aneurysm)      Surgical repair  . Discolored skin   . S/P ICD (internal cardiac defibrillator) procedure     Dr. Ladona Ridgel 2009... by the pacing  . SOB (shortness of breath)     Large left effusion/ thoracentesis/hospitalization/November, 2011... exudated.. cytology negative.. Dr.Wert.. no proof of mesothelioma  . Pericardial effusion   . Pleural effusion     Large loculated effusion on the left side November, 2011. This was tapped. It was exudative. Cytology revealed no cancer no proof of mesothelioma area pulmonary team felt that no further workup was needed  . S/P AAA repair   . Spinal stenosis     Surgery Dr.Elsner  . CAD (coronary artery disease)     Catheterization July, 2008... name and vein grafts patent but low cardiac output  . Warfarin anticoagulation   . Cardiomyopathy     Ischemic... ICD  . CHF (congestive heart failure)     EF 30-40%... echo.. November, 2008  /  EF 55-60% echo... November, 2011  . Venous insufficiency     Toe discoloration chronic  . Mitral regurgitation     Mild echo  . Aortic valve sclerosis   . Nasal drainage     Chronic  . Alcohol ingestion of more than four drinks per week     Excess beer  not a dependency problem  . Chronotropic incompetence     IV pacing rate adjusted  . Thrombophlebitis of superficial veins of upper extremities     Possible venous stenosis from defibrillator  . Pericardial effusion     November, 2011 .. decreased during hospitalization  . Eye abnormality     Ophthalmologist questions a clot in one of his eyes, May, 2012  . Overweight     November, 2012  . Pleural thickening   . Ejection fraction < 50%     Ejection fraction has varied over time from 35-50%.,, Echoes are technically very difficult,,, EF 50%, echo, May 25, 2011, technically very difficult  . Drug therapy     Redness and swelling with Avelox infusion May 24, 2011  . COPD (chronic obstructive pulmonary disease)   . Myocardial infarction   . Spinal cord stimulator status      October, 2013    Past Surgical History  Procedure  Date  . Colonoscopy w/ polypectomy   . Icd      insertion  . Lumbar fusion   . Pilonidal cyst removal   . Surgery scrotal / testicular   . Coronary artery bypass graft 2004  . Abdominal aortic aneurysm repair 11/2002    Patient Active Problem List  Diagnosis  . DIABETES MELLITUS, CONTROLLED  . GOUT  . PERIPHERAL NEUROPATHY  . DVT  . DIVERTICULOSIS, COLON  . CKD (chronic kidney disease), stage III  . 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 BACK PAIN, LUMBAR  . CORONARY ARTERY BYPASS GRAFT, HX OF  . Hyperlipidemia  . Hypertension  . Atrial flutter  . Left atrial thrombus  . AAA (abdominal aortic aneurysm)  . S/P ICD (internal cardiac defibrillator) procedure  . Warfarin anticoagulation  . Paroxysmal ventricular tachycardia  . Shortness of breath  . Atrial fibrillation  . Wide-complex tachycardia  . Spinal stenosis  . CAD (coronary artery disease)  . Cardiomyopathy  . CHF (congestive heart failure)  . Venous insufficiency  . Mitral regurgitation  . Aortic valve sclerosis  . Alcohol ingestion of more than four drinks per week  . Chronotropic incompetence  . Thrombophlebitis of superficial veins of upper extremities  . Pericardial effusion  . Overweight  . Pleural thickening  . Pleural effusion  . Ejection fraction < 50%  . Drug therapy  . Encounter for long-term (current) use of anticoagulants  . Renal insufficiency  . Edema  . Peripheral vascular disease with claudication  . Spinal cord stimulator status    ROS   Patient denies fever, chills, headache, sweats, rash, change in vision, change in hearing, chest pain, cough, nausea vomiting, urinary symptoms. All other systems are reviewed and are negative.  PHYSICAL EXAM   Patient is oriented to person time and place. Affect is normal. He is here with his wife. He looks the best he is looked in a long time.There is no jugulovenous distention. Lungs  are clear. Respiratory effort is nonlabored. Cardiac exam reveals S1 and S2. There no clicks or significant murmurs. The abdomen is protuberant as always. He has no significant peripheral edema at this time. There are no musculoskeletal deformities. There are no skin rashes.  Filed Vitals:   02/13/12 1347  BP: 154/93  Pulse: 86  Height: 6' (1.829 m)  Weight: 249 lb (112.946 kg)   EKG is done today and reviewed by me. The rhythm is paced.  ASSESSMENT & PLAN

## 2012-02-13 NOTE — Assessment & Plan Note (Signed)
With his current volume status he has the best breathing he has had a long time. No change in therapy.

## 2012-02-13 NOTE — Assessment & Plan Note (Signed)
Most recently his renal function has been stable. Chemistry is to be checked today to be sure that it is stable with his current volume status.

## 2012-02-14 NOTE — Telephone Encounter (Signed)
[name] reports his weight as 272# today.  Slight swelling in right foot but he injured his knee and thinks that is where the swelling is coming from.  He denies sob.

## 2012-02-15 NOTE — Telephone Encounter (Signed)
Weight is still 242# today.  Slight edema and no sob.  BP=143/96.  P=78.

## 2012-02-20 NOTE — Telephone Encounter (Signed)
Weight=242#.  Slight edema (unchanged) and no sob.  BP=128/92.  P=68.

## 2012-02-21 NOTE — Telephone Encounter (Signed)
Weight=242# today.   Edema unchanged (slight).  He denies sob.

## 2012-02-23 NOTE — Telephone Encounter (Signed)
Weight=244# yesterday and 243# today.  He states his legs look good (edema) and no sob.  BP=141/93.  P=76.

## 2012-02-28 ENCOUNTER — Telehealth: Payer: Self-pay

## 2012-02-28 MED ORDER — AMLODIPINE BESYLATE 5 MG PO TABS: 5.0000 mg | ORAL_TABLET | Freq: Every day | ORAL | Status: DC

## 2012-02-28 NOTE — Telephone Encounter (Signed)
Weight today and all weekend has been 243#.  Very little edema and very little sob per pt.  BP=113/77.  P=69.

## 2012-02-28 NOTE — Telephone Encounter (Signed)
Opened in error

## 2012-02-29 ENCOUNTER — Other Ambulatory Visit: Payer: Self-pay | Admitting: *Deleted

## 2012-02-29 DIAGNOSIS — I10 Essential (primary) hypertension: Secondary | ICD-10-CM

## 2012-02-29 DIAGNOSIS — I4891 Unspecified atrial fibrillation: Secondary | ICD-10-CM

## 2012-02-29 MED ORDER — WARFARIN SODIUM 2.5 MG PO TABS: 2.5000 mg | ORAL_TABLET | ORAL | Status: DC

## 2012-02-29 NOTE — Telephone Encounter (Signed)
N/A.  LMTC. 

## 2012-03-01 ENCOUNTER — Ambulatory Visit: Payer: Medicare Other | Admitting: Neurosurgery

## 2012-03-01 NOTE — Telephone Encounter (Signed)
Weight=242#.  Edema and sob are unchanged and "good" per pt.  BP=176/112.  P=94.  This am bp=158/106 p=83.

## 2012-03-02 ENCOUNTER — Other Ambulatory Visit (INDEPENDENT_AMBULATORY_CARE_PROVIDER_SITE_OTHER): Payer: Medicare Other

## 2012-03-02 DIAGNOSIS — I10 Essential (primary) hypertension: Secondary | ICD-10-CM

## 2012-03-02 DIAGNOSIS — I4891 Unspecified atrial fibrillation: Secondary | ICD-10-CM

## 2012-03-02 LAB — BASIC METABOLIC PANEL
BUN: 19 mg/dL (ref 6–23)
Calcium: 8.9 mg/dL (ref 8.4–10.5)
Creatinine, Ser: 1.4 mg/dL (ref 0.4–1.5)
GFR: 54.01 mL/min — ABNORMAL LOW (ref >60.00)
Glucose, Bld: 232 mg/dL — ABNORMAL HIGH (ref 70–99)
Potassium: 3.6 mEq/L (ref 3.5–5.1)

## 2012-03-02 NOTE — Telephone Encounter (Signed)
Weight=241#.  He states his edema and sob are "good".  He increase his amlodipine to 10mg  last night per Dr Myrtis Ser and his bp=129/73 but this am it was 152/105.

## 2012-03-05 ENCOUNTER — Encounter: Payer: Self-pay | Admitting: Internal Medicine

## 2012-03-05 ENCOUNTER — Ambulatory Visit (INDEPENDENT_AMBULATORY_CARE_PROVIDER_SITE_OTHER): Payer: Medicare Other | Admitting: *Deleted

## 2012-03-05 DIAGNOSIS — I509 Heart failure, unspecified: Secondary | ICD-10-CM

## 2012-03-05 DIAGNOSIS — I428 Other cardiomyopathies: Secondary | ICD-10-CM

## 2012-03-05 DIAGNOSIS — Z95810 Presence of automatic (implantable) cardiac defibrillator: Secondary | ICD-10-CM

## 2012-03-05 NOTE — Telephone Encounter (Signed)
Weight=245# today.  No change in edema or sob.

## 2012-03-06 ENCOUNTER — Encounter (INDEPENDENT_AMBULATORY_CARE_PROVIDER_SITE_OTHER): Payer: Medicare Other

## 2012-03-06 ENCOUNTER — Ambulatory Visit (INDEPENDENT_AMBULATORY_CARE_PROVIDER_SITE_OTHER): Payer: Medicare Other | Admitting: *Deleted

## 2012-03-06 DIAGNOSIS — Z7901 Long term (current) use of anticoagulants: Secondary | ICD-10-CM

## 2012-03-06 DIAGNOSIS — I6529 Occlusion and stenosis of unspecified carotid artery: Secondary | ICD-10-CM

## 2012-03-06 DIAGNOSIS — I4891 Unspecified atrial fibrillation: Secondary | ICD-10-CM

## 2012-03-06 DIAGNOSIS — R0989 Other specified symptoms and signs involving the circulatory and respiratory systems: Secondary | ICD-10-CM

## 2012-03-06 LAB — POCT INR: INR: 2

## 2012-03-06 NOTE — Telephone Encounter (Signed)
Weight today = 243#.  No change in edema or sob, still doing well.  BP=131/89 (at the clinic) and P=85.  Mr Amble brought his bp cuff in to be checked.  Clinic cuff =131/89.  His cuff read 152/98.

## 2012-03-07 NOTE — Telephone Encounter (Signed)
N/A.  LMTC. 

## 2012-03-08 NOTE — Telephone Encounter (Signed)
Weight=242# today.  He states his edema is better and he denies sob.

## 2012-03-12 ENCOUNTER — Encounter: Payer: Self-pay | Admitting: Cardiology

## 2012-03-12 DIAGNOSIS — I739 Peripheral vascular disease, unspecified: Secondary | ICD-10-CM

## 2012-03-12 DIAGNOSIS — I779 Disorder of arteries and arterioles, unspecified: Secondary | ICD-10-CM | POA: Insufficient documentation

## 2012-03-12 NOTE — Telephone Encounter (Signed)
Weights are as follows: 12/20= 242# 12/21=244# 12/22=243# 12/23=243# BP=121/80 and 142/88.  P=84 and 86.

## 2012-03-13 NOTE — Telephone Encounter (Signed)
Weight=241#.  BP=146/89  P=90.  No edema.  No sob.

## 2012-03-13 NOTE — Telephone Encounter (Signed)
N/A.  LMTC. 

## 2012-03-15 NOTE — Telephone Encounter (Signed)
Weight=244#.   Slight edema and no sob.  BP=124/75.  P=75.

## 2012-03-16 ENCOUNTER — Other Ambulatory Visit (INDEPENDENT_AMBULATORY_CARE_PROVIDER_SITE_OTHER): Payer: Medicare Other

## 2012-03-16 DIAGNOSIS — T887XXA Unspecified adverse effect of drug or medicament, initial encounter: Secondary | ICD-10-CM

## 2012-03-16 DIAGNOSIS — I10 Essential (primary) hypertension: Secondary | ICD-10-CM

## 2012-03-16 DIAGNOSIS — M109 Gout, unspecified: Secondary | ICD-10-CM

## 2012-03-16 DIAGNOSIS — E785 Hyperlipidemia, unspecified: Secondary | ICD-10-CM

## 2012-03-16 LAB — HEPATIC FUNCTION PANEL
Alkaline Phosphatase: 64 U/L (ref 39–117)
Bilirubin, Direct: 0.1 mg/dL (ref 0.0–0.3)
Total Bilirubin: 0.9 mg/dL (ref 0.3–1.2)
Total Protein: 6.8 g/dL (ref 6.0–8.3)

## 2012-03-16 LAB — URIC ACID: Uric Acid, Serum: 6.6 mg/dL (ref 4.0–7.8)

## 2012-03-16 LAB — BASIC METABOLIC PANEL
BUN: 19 mg/dL (ref 6–23)
Creatinine, Ser: 1.2 mg/dL (ref 0.4–1.5)
GFR: 62.85 mL/min (ref >60.00)
Potassium: 3.5 mEq/L (ref 3.5–5.1)

## 2012-03-16 LAB — LIPID PANEL
LDL Cholesterol: 58 mg/dL (ref 0–99)
Total CHOL/HDL Ratio: 3
Triglycerides: 121 mg/dL (ref 0.0–149.0)

## 2012-03-16 NOTE — Telephone Encounter (Signed)
Weight=244# today.  No edema and no sob. BP=129/84.  P=81.   Pt states he will be out of town until 03/28/11 and if he continues to do well,  will call in with a progress report next Friday, 03/23/12.  If increased problems, he will call sooner.

## 2012-03-18 LAB — REMOTE ICD DEVICE
AL AMPLITUDE: 1.5 mv
AL IMPEDENCE ICD: 532 Ohm
ATRIAL PACING ICD: 0 pct
BATTERY VOLTAGE: 2.9523 V
CHARGE TIME: 9.979 s
FVT: 0
HV IMPEDENCE: 77 Ohm
LV LEAD IMPEDENCE ICD: 532 Ohm
LV LEAD THRESHOLD: 3 V
PACEART VT: 0
RV LEAD AMPLITUDE: 18.9 mv
RV LEAD IMPEDENCE ICD: 532 Ohm
RV LEAD THRESHOLD: 0.875 V
TOT-0001: 1
TOT-0002: 0
TOT-0006: 20120604000000
TZAT-0001ATACH: 1
TZAT-0001ATACH: 2
TZAT-0001ATACH: 3
TZAT-0001FASTVT: 1
TZAT-0001SLOWVT: 1
TZAT-0001SLOWVT: 2
TZAT-0002ATACH: NEGATIVE
TZAT-0002ATACH: NEGATIVE
TZAT-0002ATACH: NEGATIVE
TZAT-0004FASTVT: 8
TZAT-0004SLOWVT: 8
TZAT-0004SLOWVT: 8
TZAT-0005FASTVT: 88 pct
TZAT-0005SLOWVT: 88 pct
TZAT-0005SLOWVT: 91 pct
TZAT-0011FASTVT: 10 ms
TZAT-0011SLOWVT: 10 ms
TZAT-0011SLOWVT: 10 ms
TZAT-0012ATACH: 150 ms
TZAT-0012ATACH: 150 ms
TZAT-0012ATACH: 150 ms
TZAT-0012FASTVT: 200 ms
TZAT-0012SLOWVT: 200 ms
TZAT-0012SLOWVT: 200 ms
TZAT-0013FASTVT: 1
TZAT-0013SLOWVT: 2
TZAT-0013SLOWVT: 2
TZAT-0018ATACH: NEGATIVE
TZAT-0018ATACH: NEGATIVE
TZAT-0018ATACH: NEGATIVE
TZAT-0018FASTVT: NEGATIVE
TZAT-0018SLOWVT: NEGATIVE
TZAT-0018SLOWVT: NEGATIVE
TZAT-0019ATACH: 6 V
TZAT-0019ATACH: 6 V
TZAT-0019ATACH: 6 V
TZAT-0019FASTVT: 8 V
TZAT-0019SLOWVT: 8 V
TZAT-0019SLOWVT: 8 V
TZAT-0020ATACH: 1.5 ms
TZAT-0020ATACH: 1.5 ms
TZAT-0020ATACH: 1.5 ms
TZAT-0020FASTVT: 1.5 ms
TZAT-0020SLOWVT: 1.5 ms
TZAT-0020SLOWVT: 1.5 ms
TZON-0003ATACH: 350 ms
TZON-0003FASTVT: 240 ms
TZON-0003SLOWVT: 340 ms
TZON-0003VSLOWVT: 450 ms
TZON-0004SLOWVT: 24
TZON-0004VSLOWVT: 20
TZON-0005SLOWVT: 12
TZST-0001ATACH: 4
TZST-0001ATACH: 5
TZST-0001ATACH: 6
TZST-0001FASTVT: 2
TZST-0001FASTVT: 3
TZST-0001FASTVT: 4
TZST-0001FASTVT: 5
TZST-0001FASTVT: 6
TZST-0001SLOWVT: 3
TZST-0001SLOWVT: 4
TZST-0001SLOWVT: 5
TZST-0001SLOWVT: 6
TZST-0002ATACH: NEGATIVE
TZST-0002ATACH: NEGATIVE
TZST-0002ATACH: NEGATIVE
TZST-0003FASTVT: 25 J
TZST-0003FASTVT: 35 J
TZST-0003FASTVT: 35 J
TZST-0003FASTVT: 35 J
TZST-0003FASTVT: 35 J
TZST-0003SLOWVT: 15 J
TZST-0003SLOWVT: 25 J
TZST-0003SLOWVT: 35 J
TZST-0003SLOWVT: 35 J
VENTRICULAR PACING ICD: 65.21 pct
VF: 0

## 2012-03-20 ENCOUNTER — Telehealth: Payer: Self-pay | Admitting: Internal Medicine

## 2012-03-20 NOTE — Telephone Encounter (Signed)
Pt's wife requesting call re pt's device, wants to talk with [facility], they are out of town

## 2012-03-22 NOTE — Telephone Encounter (Signed)
Pt received letter in regards to not sending transmission. Transmission was received. Advised pt's wife to disregard letter.

## 2012-03-23 ENCOUNTER — Encounter: Payer: Self-pay | Admitting: *Deleted

## 2012-03-28 NOTE — Telephone Encounter (Signed)
Weight today=244#.  He denies edema or sob.  BP=135/79.  P=89.

## 2012-03-29 ENCOUNTER — Ambulatory Visit (INDEPENDENT_AMBULATORY_CARE_PROVIDER_SITE_OTHER): Payer: Medicare Other | Admitting: Cardiology

## 2012-03-29 ENCOUNTER — Encounter: Payer: Self-pay | Admitting: Cardiology

## 2012-03-29 ENCOUNTER — Ambulatory Visit (INDEPENDENT_AMBULATORY_CARE_PROVIDER_SITE_OTHER): Payer: Medicare Other | Admitting: *Deleted

## 2012-03-29 VITALS — BP 134/78 | HR 79 | Ht 72.0 in | Wt 250.0 lb

## 2012-03-29 DIAGNOSIS — I10 Essential (primary) hypertension: Secondary | ICD-10-CM

## 2012-03-29 DIAGNOSIS — Z7901 Long term (current) use of anticoagulants: Secondary | ICD-10-CM

## 2012-03-29 DIAGNOSIS — I872 Venous insufficiency (chronic) (peripheral): Secondary | ICD-10-CM

## 2012-03-29 DIAGNOSIS — I779 Disorder of arteries and arterioles, unspecified: Secondary | ICD-10-CM

## 2012-03-29 DIAGNOSIS — I4891 Unspecified atrial fibrillation: Secondary | ICD-10-CM

## 2012-03-29 DIAGNOSIS — R0602 Shortness of breath: Secondary | ICD-10-CM

## 2012-03-29 LAB — BASIC METABOLIC PANEL
CO2: 28 mEq/L (ref 19–32)
Calcium: 8.9 mg/dL (ref 8.4–10.5)
Creatinine, Ser: 1.4 mg/dL (ref 0.4–1.5)
GFR: 54.45 mL/min — ABNORMAL LOW (ref >60.00)
Sodium: 138 mEq/L (ref 135–145)

## 2012-03-29 LAB — POCT INR: INR: 2.1

## 2012-03-29 MED ORDER — POTASSIUM CHLORIDE CRYS ER 20 MEQ PO TBCR: 40.0000 meq | EXTENDED_RELEASE_TABLET | Freq: Two times a day (BID) | ORAL | Status: DC

## 2012-03-29 NOTE — Patient Instructions (Addendum)
Your physician wants you to follow-up in: early April.   You will receive a reminder letter in the mail two months in advance. If you don't receive a letter, please call our office to schedule the follow-up appointment.  Your physician recommends that you return for lab work in: today (bmet)

## 2012-03-29 NOTE — Assessment & Plan Note (Signed)
Shortness of breath is greatly improved with his volume under better control. This is a function of him losing weight and being able to exercise more with his back stimulator and taking in less fluid.

## 2012-03-29 NOTE — Assessment & Plan Note (Signed)
Chemistry will be checked again today as his potassium dose was changed recently.

## 2012-03-29 NOTE — Assessment & Plan Note (Signed)
The patient does have very mild carotid disease. This can be followed over time.

## 2012-03-29 NOTE — Progress Notes (Signed)
HPI  Patient returns today and is doing very well. His breathing is greatly improved. The stimulator is keeping him from having back pain. Part of the reason his volume status is under better control is that he clearly is watching his total fluid better. This includes drinking very little beer. He has a new blood pressure cuff at home. The data is more correct and his pressure really is under reasonable control at this time.  Allergies  Allergen Reactions  . Avelox (Moxifloxacin Hcl In Nacl) Swelling, Rash and Other (See Comments)    Patient became hypotensive after infusion started  . Penicillins     REACTION: anaphylaxis    Current Outpatient Prescriptions  Medication Sig Dispense Refill  . ACCU-CHEK COMFORT CURVE test strip USE AS INSTRUCTED  100 each  3  . ACCU-CHEK SOFTCLIX LANCETS lancets USE AS DIRECTED  100 each  4  . allopurinol (ZYLOPRIM) 100 MG tablet 1/2 daily  30 tablet  6  . amLODipine (NORVASC) 5 MG tablet Take 5 mg by mouth 2 (two) times daily.      Marland Kitchen aspirin 81 MG tablet Take 81 mg by mouth once a week.       . carvedilol (COREG) 25 MG tablet Take 12.5 mg by mouth 2 (two) times daily with a meal.       . cholecalciferol (VITAMIN D) 1000 UNITS tablet Take 1,000 Units by mouth daily.      . furosemide (LASIX) 40 MG tablet Take 2 tablets (80mg ) twice daily for a weight of 254# or higher.  Take 1 tablet twice daily for a weight of 251# or lower.  160 tablet  11  . gabapentin (NEURONTIN) 100 MG capsule Take 100 mg by mouth 2 (two) times daily.      Marland Kitchen glimepiride (AMARYL) 2 MG tablet Take 1 mg by mouth daily before breakfast. TAKE 1/2 TAB daily      . ipratropium (ATROVENT) 0.06 % nasal spray Place 1 spray into the nose daily.       . nitroGLYCERIN (NITROSTAT) 0.4 MG SL tablet Place 0.4 mg under the tongue every 5 (five) minutes as needed.        . potassium chloride SA (K-DUR,KLOR-CON) 20 MEQ tablet Take 40 mEq by mouth 2 (two) times daily.       . rosuvastatin (CRESTOR) 20  MG tablet Take 1 tablet (20 mg total) by mouth daily.  30 tablet  2  . Thiamine HCl (B-1 PO) Take 125 mcg by mouth daily.      Marland Kitchen warfarin (COUMADIN) 2.5 MG tablet Take 1 tablet (2.5 mg total) by mouth as directed. Take As Directed per Anticoagulation Clinic  120 tablet  1  . colchicine 0.6 MG tablet Take 1 tablet (0.6 mg total) by mouth daily as needed. For gout  30 tablet  1    History   Social History  . Marital Status: Married    Spouse Name: N/A    Number of Children: N/A  . Years of Education: N/A   Occupational History  . Not on file.   Social History Main Topics  . Smoking status: Former Smoker    Quit date: 03/22/1995  . Smokeless tobacco: Former Neurosurgeon  . Alcohol Use: 0.0 oz/week    21-28 Cans of beer per week     Comment: beer  . Drug Use: No  . Sexually Active: Not on file   Other Topics Concern  . Not on file   Social History  Narrative  . No narrative on file    Family History  Problem Relation Age of Onset  . Hypertension Mother   . Stroke Mother   . Diabetes Father   . Coronary artery disease Father   . Other Father     DVT    Past Medical History  Diagnosis Date  . Diabetes mellitus   . Hyperlipidemia   . Hypertension   . Pilonidal cyst   . Atrial fibrillation     Previous long-term amiodarone therapy with multiple cardioversions / amiodarone stopped September, 2009  . Atrial flutter     Started November, 2010, Left-sided and cannot ablate  . Left atrial thrombus     Remote past... cardioversions done since that time  . Wide-complex tachycardia   . Left ventricular ejection fraction less than 40%   . Gout   . AAA (abdominal aortic aneurysm)     Surgical repair  . Discolored skin   . S/P ICD (internal cardiac defibrillator) procedure     Dr. Ladona Ridgel 2009... by the pacing  . SOB (shortness of breath)     Large left effusion/ thoracentesis/hospitalization/November, 2011... exudated.. cytology negative.. Dr.Wert.. no proof of mesothelioma  .  Pericardial effusion   . Pleural effusion     Large loculated effusion on the left side November, 2011. This was tapped. It was exudative. Cytology revealed no cancer no proof of mesothelioma area pulmonary team felt that no further workup was needed  . S/P AAA repair   . Spinal stenosis     Surgery Dr.Elsner  . CAD (coronary artery disease)     Catheterization July, 2008... name and vein grafts patent but low cardiac output  . Warfarin anticoagulation   . Cardiomyopathy     Ischemic... ICD  . CHF (congestive heart failure)     EF 30-40%... echo.. November, 2008  /  EF 55-60% echo... November, 2011  . Venous insufficiency     Toe discoloration chronic  . Mitral regurgitation     Mild echo  . Aortic valve sclerosis   . Nasal drainage     Chronic  . Alcohol ingestion of more than four drinks per week     Excess beer  not a dependency problem  . Chronotropic incompetence     IV pacing rate adjusted  . Thrombophlebitis of superficial veins of upper extremities     Possible venous stenosis from defibrillator  . Pericardial effusion     November, 2011 .. decreased during hospitalization  . Eye abnormality     Ophthalmologist questions a clot in one of his eyes, May, 2012  . Overweight     November, 2012  . Pleural thickening   . Ejection fraction < 50%     Ejection fraction has varied over time from 35-50%.,, Echoes are technically very difficult,,, EF 50%, echo, May 25, 2011, technically very difficult  . Drug therapy     Redness and swelling with Avelox infusion May 24, 2011  . COPD (chronic obstructive pulmonary disease)   . Myocardial infarction   . Spinal cord stimulator status     October, 2013  . Carotid artery disease     Doppler, December, 2013, 0-39% bilateral    Past Surgical History  Procedure Date  . Colonoscopy w/ polypectomy   . Icd      insertion  . Lumbar fusion   . Pilonidal cyst removal   . Surgery scrotal / testicular   . Coronary artery bypass  graft 2004  .  Abdominal aortic aneurysm repair 11/2002    Patient Active Problem List  Diagnosis  . DIABETES MELLITUS, CONTROLLED  . GOUT  . PERIPHERAL NEUROPATHY  . DVT  . DIVERTICULOSIS, COLON  . CKD (chronic kidney disease), stage III  . 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 BACK PAIN, LUMBAR  . CORONARY ARTERY BYPASS GRAFT, HX OF  . Hyperlipidemia  . Hypertension  . Atrial flutter  . Left atrial thrombus  . AAA (abdominal aortic aneurysm)  . S/P ICD (internal cardiac defibrillator) procedure  . Warfarin anticoagulation  . Paroxysmal ventricular tachycardia  . Shortness of breath  . Atrial fibrillation  . Wide-complex tachycardia  . Spinal stenosis  . CAD (coronary artery disease)  . Cardiomyopathy  . CHF (congestive heart failure)  . Venous insufficiency  . Mitral regurgitation  . Aortic valve sclerosis  . Alcohol ingestion of more than four drinks per week  . Chronotropic incompetence  . Thrombophlebitis of superficial veins of upper extremities  . Pericardial effusion  . Overweight  . Pleural thickening  . Pleural effusion  . Ejection fraction < 50%  . Drug therapy  . Encounter for long-term (current) use of anticoagulants  . Renal insufficiency  . Edema  . Peripheral vascular disease with claudication  . Spinal cord stimulator status  . Carotid artery disease    ROS   Patient denies fever, chills, headache, sweats, rash, change in vision, change in hearing, chest pain, cough, nausea vomiting, urinary symptoms. All other systems are reviewed and are negative.  PHYSICAL EXAM  Patient is oriented to person time and place. Affect is normal. Is here with his wife. There is no jugulovenous distention. Lungs are clear. Respiratory effort is not labored. Cardiac exam reveals S1 and S2. There no clicks or significant murmurs. The abdomen is protuberant as always but soft. He has no significant peripheral edema which is a major improvement for him over the  years. He is wearing his support hose.  Filed Vitals:   03/29/12 1353  BP: 134/78  Pulse: 79  Height: 6' (1.829 m)  Weight: 250 lb (113.399 kg)   EKG is done today and reviewed by me. The rhythm is paced. There is underlying atrial fibrillation.  ASSESSMENT & PLAN

## 2012-03-29 NOTE — Assessment & Plan Note (Signed)
Blood pressure is under better control. Also he has new blood pressure cuff at home. No change in therapy.

## 2012-03-30 ENCOUNTER — Telehealth: Payer: Self-pay

## 2012-03-30 ENCOUNTER — Telehealth: Payer: Self-pay | Admitting: *Deleted

## 2012-03-30 NOTE — Telephone Encounter (Signed)
Message copied by Verdene Rio on Fri Mar 30, 2012  6:24 PM ------      Message from: [name]      Created: Fri Mar 30, 2012  5:29 PM       Lipophilic (agents which dissolvein oil) such as simvastatin or lovastatin or more likely cause muscular (not joint) symptoms hydrophilic (agents dissolving in water) are less likely to cause muscle issues. I'm on Crestor 40 mg daily and have joint symptoms simply because of age. Please discuss this with Dr. Myrtis Ser or see me ; I would be fearful of him coming off this very effective medication.Marland Kitchen

## 2012-03-30 NOTE — Telephone Encounter (Signed)
Pt wife states that the main reason to D/C crestor is because of leg swelling. Pt wife wants to know if simvastatin is a option for Pt to take.Please advise

## 2012-03-30 NOTE — Telephone Encounter (Signed)
Left message to call office

## 2012-03-30 NOTE — Telephone Encounter (Signed)
Weight today=244# today, 243# yesterday when he saw Dr Myrtis Ser.  He denies edema and sob.  BP=125/84.  His labs came back with a decreased K+ (3.3).  He is to increase his K+ to 40 meq tid per Dr Patty Sermons (dod)

## 2012-03-30 NOTE — Telephone Encounter (Signed)
Lovastatin was the first cholesterol agent; it does not have efficacy the Crestor dose. Please ask Dr. Henrietta Hoover opinion as well. I don't think this would be advisable with his cardiac disease. Also . Crestor may be available through [place] @ much cheaper price. [facility] [place] [phone] (toll-free).

## 2012-03-30 NOTE — Telephone Encounter (Signed)
Patient's wife called to advise that she recently discussed Crestor with her pharmacist and the cost of Crestor will put the patient in the doughnut hole soon, not to mention patient is having swelling in knees and pain in legs since taking Crestor. Patient's wife states the pharmacist recommended Lovastatin as an alternative. [name] would like to know if Dr.Hopper would consider changing medication for cost saving purposes and due to side effects    Hopp please advise

## 2012-04-02 NOTE — Telephone Encounter (Signed)
Weight=244#.  He states he has slight increase in his foot.  He denies sob.

## 2012-04-03 ENCOUNTER — Telehealth: Payer: Self-pay

## 2012-04-03 DIAGNOSIS — I509 Heart failure, unspecified: Secondary | ICD-10-CM

## 2012-04-03 NOTE — Telephone Encounter (Signed)
Weight today=244#.  Possibly has slight edema but no sob per pt.  BP=119/71.  P=61.

## 2012-04-04 NOTE — Telephone Encounter (Signed)
Weight today=244#.  He reports slight edema and no sob.  BP=142/85.  P=86

## 2012-04-05 NOTE — Telephone Encounter (Signed)
Weight=241#.  He reports slight edema and no sob.  BP=119/85.  P=71.

## 2012-04-06 NOTE — Telephone Encounter (Signed)
Left message on VM for patient to return call when available  

## 2012-04-06 NOTE — Telephone Encounter (Signed)
I called patients wife and informed her of Dr.Hopper's response. Dr.Hopper was standing in front of me at the time of call and also spoke with patient and discussed risk and options

## 2012-04-09 NOTE — Telephone Encounter (Signed)
Weight today= 244#.  Slight edema and no sob per pt.  BP=121/82.  P=75.

## 2012-04-09 NOTE — Telephone Encounter (Signed)
Pt ok to go back to K+ bid.  BMET in one week.

## 2012-04-11 ENCOUNTER — Telehealth: Payer: Self-pay

## 2012-04-11 MED ORDER — FUROSEMIDE 40 MG PO TABS: 80.0000 mg | ORAL_TABLET | Freq: Two times a day (BID) | ORAL | Status: DC

## 2012-04-11 MED ORDER — ATORVASTATIN CALCIUM 10 MG PO TABS: 10.0000 mg | ORAL_TABLET | Freq: Every day | ORAL | Status: DC

## 2012-04-11 NOTE — Telephone Encounter (Signed)
Generic atorvastatin 10 mg daily #90.Please  schedule fasting Labs after 10 weeks of this : CK,Lipids, hepatic panel. PLEASE BRING THESE INSTRUCTIONS TO FOLLOW UP  LAB APPOINTMENT.This will guarantee correct labs are drawn, eliminating need for repeat blood sampling ( needle sticks ! ). Diagnoses /Codes: 098.1,191.47

## 2012-04-11 NOTE — Telephone Encounter (Signed)
RX sent

## 2012-04-11 NOTE — Telephone Encounter (Signed)
Spoke with patient's wife, patient called cardiology and they are ok with patient taking Lipitor(atorvastatin) in place of Crestor. Patient would like for Dr.Hopper to rx, send 90 day supply to the pharmacy on file (verified as the correct pharmacy)  Hopp please advise on dose and instruction

## 2012-04-11 NOTE — Telephone Encounter (Signed)
Weight=243# today.  He denies edema or sob.  BP=139/92.  P=86.

## 2012-04-12 NOTE — Telephone Encounter (Signed)
Weight=243# today.  He denies edema and sob.  BP=126/79.  P=64.

## 2012-04-12 NOTE — Telephone Encounter (Signed)
F/u lab appt scheduled  

## 2012-04-13 NOTE — Telephone Encounter (Signed)
[id number].  He denies sob or edema.  BP=130/80.  P=86.

## 2012-04-16 ENCOUNTER — Other Ambulatory Visit (INDEPENDENT_AMBULATORY_CARE_PROVIDER_SITE_OTHER): Payer: Medicare Other

## 2012-04-16 LAB — BASIC METABOLIC PANEL
CO2: 27 mEq/L (ref 19–32)
Calcium: 8.8 mg/dL (ref 8.4–10.5)
Chloride: 103 mEq/L (ref 96–112)
Potassium: 3.3 mEq/L — ABNORMAL LOW (ref 3.5–5.1)
Sodium: 141 mEq/L (ref 135–145)

## 2012-04-16 NOTE — Telephone Encounter (Signed)
Weight=243#.  BP=121/71.  P=80.  He denies edema and sob.

## 2012-04-17 NOTE — Telephone Encounter (Signed)
Weight=244#.  He denies sob and edema.  BP=136/93.  P=89

## 2012-04-18 ENCOUNTER — Other Ambulatory Visit: Payer: Medicare Other

## 2012-04-18 MED ORDER — AMLODIPINE BESYLATE 5 MG PO TABS: 5.0000 mg | ORAL_TABLET | Freq: Two times a day (BID) | ORAL | Status: DC

## 2012-04-18 NOTE — Telephone Encounter (Signed)
Weight=243#.  He denies edema and sob. BP=115/56.  P=75.  K+ was increased to 40 meq tid per Dr Myrtis Ser.  He will get a bmet and magnesium level on 04/26/12.

## 2012-04-19 NOTE — Telephone Encounter (Signed)
Weight=244# today.  He denies sob and edema.  BP=133/85.  P=89.

## 2012-04-23 ENCOUNTER — Telehealth: Payer: Self-pay

## 2012-04-23 NOTE — Telephone Encounter (Signed)
Weight=246#.  He states he might have slight edema left foot per pt.  He denies sob.  BP=145/89.  P=84.

## 2012-04-25 NOTE — Telephone Encounter (Signed)
Weight=244#.  He states he has slight edema and no sob.  BP=132/95.  P=89.

## 2012-04-26 ENCOUNTER — Ambulatory Visit (INDEPENDENT_AMBULATORY_CARE_PROVIDER_SITE_OTHER): Payer: Medicare Other | Admitting: *Deleted

## 2012-04-26 ENCOUNTER — Other Ambulatory Visit (INDEPENDENT_AMBULATORY_CARE_PROVIDER_SITE_OTHER): Payer: Medicare Other

## 2012-04-26 DIAGNOSIS — I509 Heart failure, unspecified: Secondary | ICD-10-CM

## 2012-04-26 DIAGNOSIS — Z7901 Long term (current) use of anticoagulants: Secondary | ICD-10-CM

## 2012-04-26 DIAGNOSIS — I4891 Unspecified atrial fibrillation: Secondary | ICD-10-CM

## 2012-04-26 LAB — BASIC METABOLIC PANEL
BUN: 16 mg/dL (ref 6–23)
CO2: 29 mEq/L (ref 19–32)
Glucose, Bld: 193 mg/dL — ABNORMAL HIGH (ref 70–99)
Potassium: 3.5 mEq/L (ref 3.5–5.1)
Sodium: 138 mEq/L (ref 135–145)

## 2012-04-26 LAB — POCT INR: INR: 2.1

## 2012-04-26 NOTE — Telephone Encounter (Signed)
Weight today = 245#.  Slight edema and no sob.  BP=134/94.  P=84

## 2012-04-27 NOTE — Telephone Encounter (Signed)
Weight=244#.  Denies edema and sob.   BP=134/83.  P=75.

## 2012-05-01 NOTE — Telephone Encounter (Signed)
Pt states his weight is 244# today.  He states his weight yesterday was 244#.  He denies edema or sob.  BP=132/87 and P=86 today.

## 2012-05-02 NOTE — Telephone Encounter (Signed)
Weight-245#.  He denies edema and sob.  BP=138/92.  P=84.

## 2012-05-02 NOTE — Telephone Encounter (Signed)
Called re: weight, edema and sob.  N/A.

## 2012-05-07 NOTE — Telephone Encounter (Signed)
Pt was called regarding his weight.  He states he has been out of town at a funeral and has not weighed lately.

## 2012-05-09 NOTE — Telephone Encounter (Signed)
Weight today=246#.  He states he has "just a little slight edema" in lower extremities.  He denies sob.  He states his BP today is 126/86 and P=85.

## 2012-05-10 NOTE — Telephone Encounter (Signed)
Weight today=246#.  He denies edema and sob..  BP=122/81.  P=82.

## 2012-05-14 ENCOUNTER — Telehealth: Payer: Self-pay | Admitting: *Deleted

## 2012-05-14 NOTE — Telephone Encounter (Signed)
Weight on 2/21=248#.  Weight today=246#.  He denies edema and sob.  BP=132/85.  P=84.

## 2012-05-14 NOTE — Telephone Encounter (Signed)
Weight=246# today.

## 2012-05-14 NOTE — Telephone Encounter (Signed)
PT'S WIFE CALLED WANTING TO SPEAK TO DEBBIE LEFLER WILL FORWARD  MESSAGE  PT'S WIFE STATED WILL CALL  BACK LATER .Zack Seal

## 2012-05-15 NOTE — Telephone Encounter (Signed)
Weight=243#.  He denies edema and sob.  BP=150/88.  P=84.

## 2012-05-16 NOTE — Telephone Encounter (Signed)
Weight today=243#.  He denies sob or edema.  BP=140/89.  P=97.

## 2012-05-17 NOTE — Telephone Encounter (Signed)
Weight today=243#.  He denies edema and sob.  BP=133/76.  P=76

## 2012-05-22 ENCOUNTER — Telehealth: Payer: Self-pay

## 2012-05-22 NOTE — Telephone Encounter (Signed)
Pt was called for his weight but states he did not weigh this am.  He states his edema is slightly worse.  He denies sob.  BP=119/79.  P=81.

## 2012-05-23 ENCOUNTER — Encounter (HOSPITAL_COMMUNITY): Payer: Self-pay | Admitting: *Deleted

## 2012-05-23 ENCOUNTER — Ambulatory Visit (INDEPENDENT_AMBULATORY_CARE_PROVIDER_SITE_OTHER): Payer: Medicare Other | Admitting: Internal Medicine

## 2012-05-23 ENCOUNTER — Emergency Department (HOSPITAL_COMMUNITY): Payer: Medicare Other

## 2012-05-23 ENCOUNTER — Inpatient Hospital Stay (HOSPITAL_COMMUNITY)
Admission: EM | Admit: 2012-05-23 | Discharge: 2012-05-29 | DRG: 607 | Disposition: A | Discharge status: Home Health | Payer: Medicare Other | Attending: Internal Medicine | Admitting: Internal Medicine

## 2012-05-23 ENCOUNTER — Encounter: Payer: Self-pay | Admitting: Internal Medicine

## 2012-05-23 VITALS — BP 118/80 | HR 106 | Temp 97.6°F | Wt 248.0 lb

## 2012-05-23 DIAGNOSIS — N289 Disorder of kidney and ureter, unspecified: Secondary | ICD-10-CM

## 2012-05-23 DIAGNOSIS — Z7982 Long term (current) use of aspirin: Secondary | ICD-10-CM

## 2012-05-23 DIAGNOSIS — I10 Essential (primary) hypertension: Secondary | ICD-10-CM

## 2012-05-23 DIAGNOSIS — I129 Hypertensive chronic kidney disease with stage 1 through stage 4 chronic kidney disease, or unspecified chronic kidney disease: Secondary | ICD-10-CM | POA: Diagnosis present

## 2012-05-23 DIAGNOSIS — I428 Other cardiomyopathies: Secondary | ICD-10-CM

## 2012-05-23 DIAGNOSIS — E119 Type 2 diabetes mellitus without complications: Secondary | ICD-10-CM | POA: Diagnosis present

## 2012-05-23 DIAGNOSIS — I059 Rheumatic mitral valve disease, unspecified: Secondary | ICD-10-CM | POA: Diagnosis present

## 2012-05-23 DIAGNOSIS — E669 Obesity, unspecified: Secondary | ICD-10-CM | POA: Diagnosis present

## 2012-05-23 DIAGNOSIS — I509 Heart failure, unspecified: Secondary | ICD-10-CM

## 2012-05-23 DIAGNOSIS — I658 Occlusion and stenosis of other precerebral arteries: Secondary | ICD-10-CM | POA: Diagnosis present

## 2012-05-23 DIAGNOSIS — I739 Peripheral vascular disease, unspecified: Secondary | ICD-10-CM

## 2012-05-23 DIAGNOSIS — L723 Sebaceous cyst: Principal | ICD-10-CM | POA: Diagnosis present

## 2012-05-23 DIAGNOSIS — I252 Old myocardial infarction: Secondary | ICD-10-CM

## 2012-05-23 DIAGNOSIS — L02219 Cutaneous abscess of trunk, unspecified: Secondary | ICD-10-CM

## 2012-05-23 DIAGNOSIS — N492 Inflammatory disorders of scrotum: Secondary | ICD-10-CM

## 2012-05-23 DIAGNOSIS — I6529 Occlusion and stenosis of unspecified carotid artery: Secondary | ICD-10-CM | POA: Diagnosis present

## 2012-05-23 DIAGNOSIS — Z95810 Presence of automatic (implantable) cardiac defibrillator: Secondary | ICD-10-CM

## 2012-05-23 DIAGNOSIS — J4489 Other specified chronic obstructive pulmonary disease: Secondary | ICD-10-CM | POA: Diagnosis present

## 2012-05-23 DIAGNOSIS — Z79899 Other long term (current) drug therapy: Secondary | ICD-10-CM

## 2012-05-23 DIAGNOSIS — J449 Chronic obstructive pulmonary disease, unspecified: Secondary | ICD-10-CM | POA: Diagnosis present

## 2012-05-23 DIAGNOSIS — I2589 Other forms of chronic ischemic heart disease: Secondary | ICD-10-CM | POA: Diagnosis present

## 2012-05-23 DIAGNOSIS — I872 Venous insufficiency (chronic) (peripheral): Secondary | ICD-10-CM | POA: Diagnosis present

## 2012-05-23 DIAGNOSIS — E1151 Type 2 diabetes mellitus with diabetic peripheral angiopathy without gangrene: Secondary | ICD-10-CM | POA: Diagnosis present

## 2012-05-23 DIAGNOSIS — M545 Low back pain: Secondary | ICD-10-CM | POA: Diagnosis present

## 2012-05-23 DIAGNOSIS — R609 Edema, unspecified: Secondary | ICD-10-CM

## 2012-05-23 DIAGNOSIS — I5022 Chronic systolic (congestive) heart failure: Secondary | ICD-10-CM | POA: Diagnosis present

## 2012-05-23 DIAGNOSIS — Z7901 Long term (current) use of anticoagulants: Secondary | ICD-10-CM

## 2012-05-23 DIAGNOSIS — L02215 Cutaneous abscess of perineum: Secondary | ICD-10-CM

## 2012-05-23 DIAGNOSIS — E785 Hyperlipidemia, unspecified: Secondary | ICD-10-CM | POA: Diagnosis present

## 2012-05-23 DIAGNOSIS — L03319 Cellulitis of trunk, unspecified: Secondary | ICD-10-CM

## 2012-05-23 DIAGNOSIS — I4891 Unspecified atrial fibrillation: Secondary | ICD-10-CM

## 2012-05-23 DIAGNOSIS — N493 Fournier gangrene: Secondary | ICD-10-CM

## 2012-05-23 DIAGNOSIS — I2510 Atherosclerotic heart disease of native coronary artery without angina pectoris: Secondary | ICD-10-CM

## 2012-05-23 DIAGNOSIS — Z87891 Personal history of nicotine dependence: Secondary | ICD-10-CM

## 2012-05-23 DIAGNOSIS — I429 Cardiomyopathy, unspecified: Secondary | ICD-10-CM

## 2012-05-23 DIAGNOSIS — N183 Chronic kidney disease, stage 3 unspecified: Secondary | ICD-10-CM

## 2012-05-23 DIAGNOSIS — N501 Vascular disorders of male genital organs: Secondary | ICD-10-CM

## 2012-05-23 DIAGNOSIS — Z951 Presence of aortocoronary bypass graft: Secondary | ICD-10-CM

## 2012-05-23 DIAGNOSIS — M109 Gout, unspecified: Secondary | ICD-10-CM

## 2012-05-23 HISTORY — DX: Unspecified osteoarthritis, unspecified site: M19.90

## 2012-05-23 HISTORY — DX: Pneumonia, unspecified organism: J18.9

## 2012-05-23 LAB — BASIC METABOLIC PANEL
BUN: 25 mg/dL — ABNORMAL HIGH (ref 6–23)
CO2: 24 mEq/L (ref 19–32)
Calcium: 9.2 mg/dL (ref 8.4–10.5)
Creatinine, Ser: 1.29 mg/dL (ref 0.50–1.35)
GFR calc non Af Amer: 54 mL/min — ABNORMAL LOW (ref >90)
Glucose, Bld: 189 mg/dL — ABNORMAL HIGH (ref 70–99)

## 2012-05-23 LAB — CBC
HCT: 44.4 % (ref 39.0–52.0)
Hemoglobin: 15.7 g/dL (ref 13.0–17.0)
MCH: 31.3 pg (ref 26.0–34.0)
MCHC: 35.4 g/dL (ref 30.0–36.0)
MCV: 88.6 fL (ref 78.0–100.0)
RBC: 5.01 MIL/uL (ref 4.22–5.81)

## 2012-05-23 LAB — URINALYSIS, ROUTINE W REFLEX MICROSCOPIC
Bilirubin Urine: NEGATIVE
Glucose, UA: NEGATIVE mg/dL
Hgb urine dipstick: NEGATIVE
Ketones, ur: NEGATIVE mg/dL
Protein, ur: NEGATIVE mg/dL

## 2012-05-23 LAB — APTT: aPTT: 34 seconds (ref 24–37)

## 2012-05-23 LAB — PROTIME-INR: INR: 1.68 — ABNORMAL HIGH (ref 0.00–1.49)

## 2012-05-23 MED ORDER — AMLODIPINE BESYLATE 5 MG PO TABS
5.0000 mg | ORAL_TABLET | Freq: Two times a day (BID) | ORAL | Status: DC
  Administered 2012-05-24 – 2012-05-29 (×12): 5 mg via ORAL
  Filled 2012-05-23 (×13): qty 1

## 2012-05-23 MED ORDER — ONDANSETRON HCL 4 MG/2ML IJ SOLN: 4.0000 mg | Freq: Four times a day (QID) | INTRAMUSCULAR | Status: DC | PRN

## 2012-05-23 MED ORDER — GABAPENTIN 100 MG PO CAPS
100.0000 mg | ORAL_CAPSULE | Freq: Two times a day (BID) | ORAL | Status: DC
  Administered 2012-05-24 – 2012-05-29 (×12): 100 mg via ORAL
  Filled 2012-05-23 (×13): qty 1

## 2012-05-23 MED ORDER — IOHEXOL 300 MG/ML  SOLN
100.0000 mL | Freq: Once | INTRAMUSCULAR | Status: AC | PRN
  Administered 2012-05-23: 100 mL via INTRAVENOUS

## 2012-05-23 MED ORDER — CLINDAMYCIN PHOSPHATE 600 MG/50ML IV SOLN
600.0000 mg | Freq: Once | INTRAVENOUS | Status: AC
  Administered 2012-05-23: 600 mg via INTRAVENOUS
  Filled 2012-05-23: qty 50

## 2012-05-23 MED ORDER — CARVEDILOL 12.5 MG PO TABS
12.5000 mg | ORAL_TABLET | Freq: Two times a day (BID) | ORAL | Status: DC
  Administered 2012-05-24 – 2012-05-29 (×11): 12.5 mg via ORAL
  Filled 2012-05-23 (×13): qty 1

## 2012-05-23 MED ORDER — DEXTROSE-NACL 5-0.9 % IV SOLN
INTRAVENOUS | Status: DC
  Administered 2012-05-24: 01:00:00 via INTRAVENOUS

## 2012-05-23 MED ORDER — ALLOPURINOL 100 MG PO TABS
50.0000 mg | ORAL_TABLET | Freq: Every day | ORAL | Status: DC
  Administered 2012-05-24 – 2012-05-29 (×6): 50 mg via ORAL
  Filled 2012-05-23 (×6): qty 0.5

## 2012-05-23 MED ORDER — VITAMIN K1 10 MG/ML IJ SOLN: 10.0000 mg | Freq: Once | INTRAVENOUS | Status: DC

## 2012-05-23 MED ORDER — SODIUM CHLORIDE 0.9 % IJ SOLN
3.0000 mL | Freq: Two times a day (BID) | INTRAMUSCULAR | Status: DC
  Administered 2012-05-24 – 2012-05-29 (×6): 3 mL via INTRAVENOUS

## 2012-05-23 MED ORDER — VANCOMYCIN HCL IN DEXTROSE 1-5 GM/200ML-% IV SOLN
1000.0000 mg | Freq: Two times a day (BID) | INTRAVENOUS | Status: DC
  Administered 2012-05-24 – 2012-05-26 (×5): 1000 mg via INTRAVENOUS
  Filled 2012-05-23 (×6): qty 200

## 2012-05-23 MED ORDER — VANCOMYCIN HCL 10 G IV SOLR
2000.0000 mg | INTRAVENOUS | Status: AC
  Administered 2012-05-23: 2000 mg via INTRAVENOUS
  Filled 2012-05-23: qty 2000

## 2012-05-23 MED ORDER — INSULIN ASPART 100 UNIT/ML ~~LOC~~ SOLN
0.0000 [IU] | SUBCUTANEOUS | Status: DC
  Administered 2012-05-24 (×2): 3 [IU] via SUBCUTANEOUS
  Administered 2012-05-24 – 2012-05-25 (×4): 2 [IU] via SUBCUTANEOUS

## 2012-05-23 MED ORDER — DOCUSATE SODIUM 100 MG PO CAPS
100.0000 mg | ORAL_CAPSULE | Freq: Two times a day (BID) | ORAL | Status: DC
  Administered 2012-05-24 – 2012-05-28 (×5): 100 mg via ORAL
  Filled 2012-05-23 (×14): qty 1

## 2012-05-23 MED ORDER — HYDROMORPHONE HCL PF 1 MG/ML IJ SOLN
1.0000 mg | INTRAMUSCULAR | Status: DC | PRN
  Administered 2012-05-24 – 2012-05-26 (×4): 1 mg via INTRAVENOUS
  Filled 2012-05-23 (×4): qty 1

## 2012-05-23 MED ORDER — CLINDAMYCIN PHOSPHATE 900 MG/50ML IV SOLN
900.0000 mg | Freq: Three times a day (TID) | INTRAVENOUS | Status: DC
  Administered 2012-05-24 – 2012-05-29 (×16): 900 mg via INTRAVENOUS
  Filled 2012-05-23 (×20): qty 50

## 2012-05-23 MED ORDER — GLIMEPIRIDE 1 MG PO TABS
1.0000 mg | ORAL_TABLET | Freq: Every day | ORAL | Status: DC
  Administered 2012-05-24 – 2012-05-29 (×5): 1 mg via ORAL
  Filled 2012-05-23 (×7): qty 1

## 2012-05-23 MED ORDER — ATORVASTATIN CALCIUM 10 MG PO TABS
10.0000 mg | ORAL_TABLET | Freq: Every day | ORAL | Status: DC
  Administered 2012-05-24 – 2012-05-29 (×6): 10 mg via ORAL
  Filled 2012-05-23 (×6): qty 1

## 2012-05-23 MED ORDER — VITAMIN K1 10 MG/ML IJ SOLN
5.0000 mg | Freq: Once | INTRAVENOUS | Status: AC
  Administered 2012-05-23: 5 mg via INTRAVENOUS
  Filled 2012-05-23: qty 0.5

## 2012-05-23 MED ORDER — ASPIRIN 81 MG PO CHEW
81.0000 mg | CHEWABLE_TABLET | ORAL | Status: DC
  Administered 2012-05-24: 81 mg via ORAL
  Filled 2012-05-23: qty 1

## 2012-05-23 MED ORDER — SODIUM CHLORIDE 0.9 % IV BOLUS (SEPSIS)
1000.0000 mL | Freq: Once | INTRAVENOUS | Status: AC
  Administered 2012-05-23: 1000 mL via INTRAVENOUS

## 2012-05-23 MED ORDER — ONDANSETRON HCL 4 MG PO TABS: 4.0000 mg | ORAL_TABLET | Freq: Four times a day (QID) | ORAL | Status: DC | PRN

## 2012-05-23 MED ORDER — IPRATROPIUM BROMIDE 0.06 % NA SOLN
1.0000 | Freq: Every day | NASAL | Status: DC
  Administered 2012-05-24 – 2012-05-29 (×4): 1 via NASAL
  Filled 2012-05-23 (×3): qty 15

## 2012-05-23 NOTE — ED Notes (Signed)
Floor called to give report, receiving RN Kathlene November with another pt. He will call back to get report.

## 2012-05-23 NOTE — ED Notes (Addendum)
Pt sent here by dr hopper for eval of perineal abscess, pt having swelling to testicles x 2 days, began draining on 3/2 with brown foul smelling discharge, having fever and chills. No acute distress noted at triage at this time. Sent here for I&D and iv antibiotics.

## 2012-05-23 NOTE — Progress Notes (Signed)
   Subjective:    Patient ID: [name], male    DOB: Apr 20, 1940, 72 y.o.   MRN: [id number]  HPI 2 days ago he noticed swelling of the testicles and associated perineal mass. Pustule was noted to form in this area 3/2 and began draining 3/3 with foul-smelling brownish discharge. He's had intermittent chills as well as temperature up to 101.  He actually had noted a small granuloma in the perineal area initially 6-8 months ago.      Review of Systems He denies dysuria, pyuria, or hematuria  His blood sugars have been ranging in the 150-180 range. This morning was 118; but he has eaten minimally in the last 2 days.     Objective:   Physical Exam  He is in no acute distress.  There is no increased work of breathing; he has minimal rales.  Flow murmur present. Cardiac rhythm irregular ;  sounds are distant in character.  Abdomen protuberant: Bowel sounds present  Testicles are both descended and nontender  He has no inguinal lymphadenopathy.  There is a shallow ulcer approximately 12 x 12 cm in the perineal area. There is a central deficit  from which bloody material thick bloody material is extruding.        Assessment & Plan:  #1 perineal abscess; probable anaerobic infection  #2 diabetes, questionable control.  Plan: Incision and drainage would be necessary in addition to antibiotic therapy. The temperature to 101 associated with some intermittent chills is worrisome for imminent sepsis.  The potential risk and the need for parenteral antibiotics as well as drainage of the lesion was discussed. Problematic is documented allergy to Avelox as well as penicillin with anaphylactoid type picture. Also problematic is his warfarin therapy

## 2012-05-23 NOTE — Telephone Encounter (Signed)
Weight today=242#.  He reports slight edema and no sob.  BP=126/83.  P=94.   Pt is reporting that he is weak, has an elevated temp and painful swollen testicle.  I told him he needs to contact his pcp for their advice.

## 2012-05-23 NOTE — Patient Instructions (Addendum)
Review and correct the record as indicated. Please share record with all medical staff seen.  

## 2012-05-23 NOTE — H&P (Signed)
[facility] History and Physical  [name] EXB:[id number] DOB: 1940/04/17    PCP:   [name], MD   Chief Complaint: Scrotal swelling.  HPI: [name] is an 72 y.o. male with hx of ischemic CMP, Vtach s/p IVCD, systolic CHF with EF 40%, HTN, hx of left atrial thrombus, DM, hyperlipidemia, atrial fibrillation, presents to the ER with swelling of his scrotal for a few days.  He started to squeeze the small pustule, then use a needle, burned in alcohol to sterilize it and did an I and D himself.  The area of puncture became black and more painful.  He has no fever or chills.  Evaluation in the ER included a pelvic CT which showed no abcesses.  He has a WBC of 16K with normal renal fx tests.  He was seen in consultation with Dr Lindie Spruce of surgery and he recommended IV antibiotic at this point. His INR is 1.68 at this time.  Hospitalist was asked to admit him for scrotal cellulitis/ early Fournier.    Rewiew of Systems:  Constitutional: Negative for malaise, fever and chills. No significant weight loss or weight gain Eyes: Negative for eye pain, redness and discharge, diplopia, visual changes, or flashes of light. ENMT: Negative for ear pain, hoarseness, nasal congestion, sinus pressure and sore throat. No headaches; tinnitus, drooling, or problem swallowing. Cardiovascular: Negative for chest pain, palpitations, diaphoresis, dyspnea and peripheral edema. ; No orthopnea, PND Respiratory: Negative for cough, hemoptysis, wheezing and stridor. No pleuritic chestpain. Gastrointestinal: Negative for nausea, vomiting, diarrhea, constipation, abdominal pain, melena, blood in stool, hematemesis, jaundice and rectal bleeding.    Genitourinary: Negative for frequency, dysuria, incontinence,flank pain and hematuria; Musculoskeletal: Negative for back pain and neck pain. Negative for swelling and trauma.;  Skin: . Negative for pruritus, rash, abrasions, bruising and skin  lesion Neuro: Negative for headache, lightheadedness and neck stiffness. Negative for weakness, altered level of consciousness , altered mental status, extremity weakness, burning feet, involuntary movement, seizure and syncope.  Psych: negative for anxiety, depression, insomnia, tearfulness, panic attacks, hallucinations, paranoia, suicidal or homicidal ideation    Past Medical History  Diagnosis Date  . Diabetes mellitus   . Hyperlipidemia   . Hypertension   . Pilonidal cyst   . Atrial fibrillation     Previous long-term amiodarone therapy with multiple cardioversions / amiodarone stopped September, 2009  . Atrial flutter     Started November, 2010, Left-sided and cannot ablate  . Left atrial thrombus     Remote past... cardioversions done since that time  . Wide-complex tachycardia   . Left ventricular ejection fraction less than 40%   . Gout   . AAA (abdominal aortic aneurysm)     Surgical repair  . Discolored skin   . S/P ICD (internal cardiac defibrillator) procedure     Dr. Ladona Ridgel 2009... by the pacing  . SOB (shortness of breath)     Large left effusion/ thoracentesis/hospitalization/November, 2011... exudated.. cytology negative.. Dr.Wert.. no proof of mesothelioma  . Pericardial effusion   . Pleural effusion     Large loculated effusion on the left side November, 2011. This was tapped. It was exudative. Cytology revealed no cancer no proof of mesothelioma area pulmonary team felt that no further workup was needed  . S/P AAA repair   . Spinal stenosis     Surgery Dr.Elsner  . CAD (coronary artery disease)     Catheterization July, 2008... name and vein grafts patent but low cardiac output  .  Warfarin anticoagulation   . Cardiomyopathy     Ischemic... ICD  . CHF (congestive heart failure)     EF 30-40%... echo.. November, 2008  /  EF 55-60% echo... November, 2011  . Venous insufficiency     Toe discoloration chronic  . Mitral regurgitation     Mild echo  . Aortic  valve sclerosis   . Nasal drainage     Chronic  . Alcohol ingestion of more than four drinks per week     Excess beer  not a dependency problem  . Chronotropic incompetence     IV pacing rate adjusted  . Thrombophlebitis of superficial veins of upper extremities     Possible venous stenosis from defibrillator  . Pericardial effusion     November, 2011 .. decreased during hospitalization  . Eye abnormality     Ophthalmologist questions a clot in one of his eyes, May, 2012  . Overweight     November, 2012  . Pleural thickening   . Ejection fraction < 50%     Ejection fraction has varied over time from 35-50%.,, Echoes are technically very difficult,,, EF 50%, echo, May 25, 2011, technically very difficult  . Drug therapy     Redness and swelling with Avelox infusion May 24, 2011  . COPD (chronic obstructive pulmonary disease)   . Myocardial infarction   . Spinal cord stimulator status     October, 2013  . Carotid artery disease     Doppler, December, 2013, 0-39% bilateral    Past Surgical History  Procedure Laterality Date  . Colonoscopy w/ polypectomy    . Icd       insertion  . Lumbar fusion    . Pilonidal cyst removal    . Surgery scrotal / testicular    . Coronary artery bypass graft  2004  . Abdominal aortic aneurysm repair  11/2002    Medications:  HOME MEDS: Prior to Admission medications   Medication Sig Start Date End Date Taking? Authorizing Provider  allopurinol (ZYLOPRIM) 100 MG tablet Take 50 mg by mouth daily.   Yes Historical Provider, MD  amLODipine (NORVASC) 5 MG tablet Take 1 tablet (5 mg total) by mouth 2 (two) times daily. 04/18/12  Yes [name], MD  aspirin 81 MG tablet Take 81 mg by mouth once a week. sunday   Yes Historical Provider, MD  atorvastatin (LIPITOR) 10 MG tablet Take 1 tablet (10 mg total) by mouth daily. 04/11/12  Yes [name], MD  carvedilol (COREG) 25 MG tablet Take 12.5 mg by mouth 2 (two) times daily with a meal.   07/25/11  Yes Historical Provider, MD  cholecalciferol (VITAMIN D) 1000 UNITS tablet Take 1,000 Units by mouth daily.   Yes Historical Provider, MD  gabapentin (NEURONTIN) 100 MG capsule Take 100 mg by mouth 2 (two) times daily.   Yes Historical Provider, MD  glimepiride (AMARYL) 2 MG tablet Take 1 mg by mouth daily before breakfast.  07/24/11  Yes [name], MD  ipratropium (ATROVENT) 0.06 % nasal spray Place 1 spray into the nose daily.  01/24/11  Yes Historical Provider, MD  nitroGLYCERIN (NITROSTAT) 0.4 MG SL tablet Place 0.4 mg under the tongue every 5 (five) minutes as needed.     Yes Historical Provider, MD  potassium chloride SA (K-DUR,KLOR-CON) 20 MEQ tablet Take 80 mEq by mouth 2 (two) times daily.   Yes Historical Provider, MD  Thiamine HCl (B-1 PO) Take 125 mcg by mouth daily.  Yes Historical Provider, MD  warfarin (COUMADIN) 2.5 MG tablet Take 2.5-5 mg by mouth every evening. Pt taking 2.5 mg every day except 5 mg on wed   Yes Historical Provider, MD     Allergies:  Allergies  Allergen Reactions  . Avelox (Moxifloxacin Hcl In Nacl) Swelling, Rash and Other (See Comments)    Patient became hypotensive after infusion started  . Penicillins     REACTION: anaphylaxis    Social History:   reports that he quit smoking about 17 years ago. He has quit using smokeless tobacco. He reports that  drinks alcohol. He reports that he does not use illicit drugs.  Family History: Family History  Problem Relation Age of Onset  . Hypertension Mother   . Stroke Mother   . Diabetes Father   . Coronary artery disease Father   . Other Father     DVT     Physical Exam: Filed Vitals:   05/23/12 1450 05/23/12 1700 05/23/12 2050 05/23/12 2100  BP: 137/84  140/87 135/91  Pulse: 90  90 98  Temp: 98.2 F (36.8 C)  98.5 F (36.9 C)   TempSrc: Oral  Oral   Resp: 16  17 21   Height:  6' 0.05" (1.83 m)    Weight:  112.5 kg (248 lb 0.3 oz)    SpO2: 96%  97% 98%   Blood pressure 135/91,  pulse 98, temperature 98.5 F (36.9 C), temperature source Oral, resp. rate 21, height 6' 0.05" (1.83 m), weight 112.5 kg (248 lb 0.3 oz), SpO2 98.00%.  GEN:  Pleasant patient lying in the stretcher in no acute distress; cooperative with exam. PSYCH:  alert and oriented x4; does not appear anxious or depressed; affect is appropriate. HEENT: Mucous membranes pink and anicteric; PERRLA; EOM intact; no cervical lymphadenopathy nor thyromegaly or carotid bruit; no JVD; There were no stridor. Neck is very supple. Breasts:: Not examined CHEST WALL: No tenderness CHEST: Normal respiration, clear to auscultation bilaterally.  HEART: Regular rate and rhythm.  There are no murmur, rub, or gallops.   BACK: No kyphosis or scoliosis; no CVA tenderness ABDOMEN: soft and non-tender; no masses, no organomegaly, normal abdominal bowel sounds; no pannus; no intertriginous candida. There is no rebound and no distention. Rectal Exam: Not done EXTREMITIES: No bone or joint deformity; age-appropriate arthropathy of the hands and knees; no edema; no ulcerations.  There is no calf tenderness. Genitalia: not examined PULSES: 2+ and symmetric SKIN: scrotum is swollen, erythematous, and at the base is a focal black skin with purulent exudate. CNS: Cranial nerves 2-12 grossly intact no focal lateralizing neurologic deficit.  Speech is fluent; uvula elevated with phonation, facial symmetry and tongue midline. DTR are normal bilaterally, cerebella exam is intact, barbinski is negative and strengths are equaled bilaterally.  No sensory loss.   Labs on Admission:  Basic Metabolic Panel:  Recent Labs Lab 05/23/12 1712  NA 137  K 4.0  CL 99  CO2 24  GLUCOSE 189*  BUN 25*  CREATININE 1.29  CALCIUM 9.2   Liver Function Tests: No results found for this basename: AST, ALT, ALKPHOS, BILITOT, PROT, ALBUMIN,  in the last 168 hours No results found for this basename: LIPASE, AMYLASE,  in the last 168 hours No results  found for this basename: AMMONIA,  in the last 168 hours CBC:  Recent Labs Lab 05/23/12 1509  WBC 16.3*  HGB 15.7  HCT 44.4  MCV 88.6  PLT 158   Cardiac Enzymes: No results  found for this basename: CKTOTAL, CKMB, CKMBINDEX, TROPONINI,  in the last 168 hours  CBG: No results found for this basename: GLUCAP,  in the last 168 hours   Radiological Exams on Admission: Dg Chest 2 View  05/23/2012  *RADIOLOGY REPORT*  Clinical Data: Antral fibrillation  CHEST - 2 VIEW  Comparison: Chest radiograph 05/30/2011  Findings: Sternotomy wires overlie stable enlarged cardiac silhouette. There is stimulator devices within the central canal of the thoracic spine which is new from prior.  There is a left effusion and atelectasis is unchanged from prior. Bulky osteophytosis of the spine.  IMPRESSION:  1.  No significant interval change other than the hardware described. 2.  Left lower lobe atelectasis and small effusion. 3.  Cardiomegaly. 4. Degenerative change in the spine.   Original Report Authenticated By: Genevive Bi, M.D.    Ct Pelvis W Contrast  05/23/2012  *RADIOLOGY REPORT*  Clinical Data:  Fever.  Possible abscess of the thighs.  CT PELVIS WITH CONTRAST  Technique:  Multidetector CT imaging of the pelvis was performed using the standard protocol following the bolus administration of intravenous contrast.  Contrast: OMNIPAQUE IOHEXOL 300 MG/ML  SOLN  Comparison:   None.  Findings:  There is a large skin ulceration of the perineum at the base of the scrotum.  The ulceration may be packed.  No adjacent fluid collection is identified.  The patient does have bilateral hydroceles which appear large and greater on the right.  Upper thighs are unremarkable.  Imaged intrapelvic contents demonstrate no focal abnormality.  Atherosclerotic vascular disease is seen. The left common femoral artery is mildly dilated at 2.1 cm.  There is no lymphadenopathy or fluid within the pelvis.  No focal bony  abnormality is identified.  The patient is status post lower lumbar fusion.  IMPRESSION:  1.  Large peroneal skin ulceration at the base of the scrotum without underlying abscess. 2.  Large appearing bilateral hydroceles, greater on the right. 3.  Mild dilatation of the left common femoral artery 2.1 cm.   Original Report Authenticated By: Holley Dexter, M.D.    Assessment/Plan Present on Admission:  . Cellulitis of scrotum . S/P ICD (internal cardiac defibrillator) procedure . Renal insufficiency . Hypertension . Carotid artery disease . Cardiomyopathy . BACK PAIN, LUMBAR . Atrial fibrillation . DIABETES MELLITUS, CONTROLLED . CKD (chronic kidney disease), stage III   PLAN:  WIll admit him for scrotal cellulitis.  I will continue with IV Van/ IV Clinda.  He has anaphylaxis to PCN.  For his renal insufficiency, his Cr is good at 1.29.  His BP is under good control so will continue his meds.  For his atrial fib, I will discontinue his coumadin in case he needs surgery and will have him NPO also.  His BP is in good control as well and I will continue his meds.  He is stable, full code, and will be admitted to [facility] service.  Other plans as per orders.  Code Status: FULL Unk Lightning, MD. [facility] Pager [phone] 7pm to 7am.  05/23/2012, 9:24 PM

## 2012-05-23 NOTE — ED Provider Notes (Signed)
History     CSN: [id number]  Arrival date & time 05/23/12  1445   First MD Initiated Contact with Patient 05/23/12 1704      Chief Complaint  Patient presents with  . Abscess  . Fever    (Consider location/radiation/quality/duration/timing/severity/associated sxs/prior treatment) Patient is a 72 y.o. male presenting with abscess and fever.  Abscess Associated symptoms: fever   Fever  Pt with multiple medical problems including DM and history of Afib on coumadin who has noticed a small knot on his perineum for several months, but in the last 2 days, it has gotten more painful and swollen. He began running a fever yesterday and began having drainage today. Seen at PCP office today and referred to the ED for further evaluation. His pain is severe, aching and worse with movement. Denies any dysuria, or difficulty with bowel movements.   Past Medical History  Diagnosis Date  . Diabetes mellitus   . Hyperlipidemia   . Hypertension   . Pilonidal cyst   . Atrial fibrillation     Previous long-term amiodarone therapy with multiple cardioversions / amiodarone stopped September, 2009  . Atrial flutter     Started November, 2010, Left-sided and cannot ablate  . Left atrial thrombus     Remote past... cardioversions done since that time  . Wide-complex tachycardia   . Left ventricular ejection fraction less than 40%   . Gout   . AAA (abdominal aortic aneurysm)     Surgical repair  . Discolored skin   . S/P ICD (internal cardiac defibrillator) procedure     Dr. Ladona Ridgel 2009... by the pacing  . SOB (shortness of breath)     Large left effusion/ thoracentesis/hospitalization/November, 2011... exudated.. cytology negative.. Dr.Wert.. no proof of mesothelioma  . Pericardial effusion   . Pleural effusion     Large loculated effusion on the left side November, 2011. This was tapped. It was exudative. Cytology revealed no cancer no proof of mesothelioma area pulmonary team felt that no  further workup was needed  . S/P AAA repair   . Spinal stenosis     Surgery Dr.Elsner  . CAD (coronary artery disease)     Catheterization July, 2008... name and vein grafts patent but low cardiac output  . Warfarin anticoagulation   . Cardiomyopathy     Ischemic... ICD  . CHF (congestive heart failure)     EF 30-40%... echo.. November, 2008  /  EF 55-60% echo... November, 2011  . Venous insufficiency     Toe discoloration chronic  . Mitral regurgitation     Mild echo  . Aortic valve sclerosis   . Nasal drainage     Chronic  . Alcohol ingestion of more than four drinks per week     Excess beer  not a dependency problem  . Chronotropic incompetence     IV pacing rate adjusted  . Thrombophlebitis of superficial veins of upper extremities     Possible venous stenosis from defibrillator  . Pericardial effusion     November, 2011 .. decreased during hospitalization  . Eye abnormality     Ophthalmologist questions a clot in one of his eyes, May, 2012  . Overweight     November, 2012  . Pleural thickening   . Ejection fraction < 50%     Ejection fraction has varied over time from 35-50%.,, Echoes are technically very difficult,,, EF 50%, echo, May 25, 2011, technically very difficult  . Drug therapy     Redness  and swelling with Avelox infusion May 24, 2011  . COPD (chronic obstructive pulmonary disease)   . Myocardial infarction   . Spinal cord stimulator status     October, 2013  . Carotid artery disease     Doppler, December, 2013, 0-39% bilateral    Past Surgical History  Procedure Laterality Date  . Colonoscopy w/ polypectomy    . Icd       insertion  . Lumbar fusion    . Pilonidal cyst removal    . Surgery scrotal / testicular    . Coronary artery bypass graft  2004  . Abdominal aortic aneurysm repair  11/2002    Family History  Problem Relation Age of Onset  . Hypertension Mother   . Stroke Mother   . Diabetes Father   . Coronary artery disease Father   .  Other Father     DVT    History  Substance Use Topics  . Smoking status: Former Smoker    Quit date: 03/22/1995  . Smokeless tobacco: Former Neurosurgeon  . Alcohol Use: 0.0 oz/week    21-28 Cans of beer per week     Comment: beer      Review of Systems  Constitutional: Positive for fever.   All other systems reviewed and are negative except as noted in HPI.   Allergies  Avelox and Penicillins  Home Medications   Current Outpatient Rx  Name  Route  Sig  Dispense  Refill  . allopurinol (ZYLOPRIM) 100 MG tablet   Oral   Take 50 mg by mouth daily.         Marland Kitchen amLODipine (NORVASC) 5 MG tablet   Oral   Take 1 tablet (5 mg total) by mouth 2 (two) times daily.   180 tablet   3   . aspirin 81 MG tablet   Oral   Take 81 mg by mouth once a week.          Marland Kitchen atorvastatin (LIPITOR) 10 MG tablet   Oral   Take 1 tablet (10 mg total) by mouth daily.   90 tablet   0   . carvedilol (COREG) 25 MG tablet   Oral   Take 12.5 mg by mouth 2 (two) times daily with a meal.          . cholecalciferol (VITAMIN D) 1000 UNITS tablet   Oral   Take 1,000 Units by mouth daily.         . colchicine 0.6 MG tablet   Oral   Take 1 tablet (0.6 mg total) by mouth daily as needed. For gout   30 tablet   1   . furosemide (LASIX) 40 MG tablet   Oral   Take 2 tablets (80 mg total) by mouth 2 (two) times daily. Take 2 tablets (80mg ) twice daily for a weight of 254# or higher.  Take 1 tablet twice daily for a weight of 251# or lower.   360 tablet   3   . gabapentin (NEURONTIN) 100 MG capsule   Oral   Take 100 mg by mouth 2 (two) times daily.         Marland Kitchen glimepiride (AMARYL) 2 MG tablet   Oral   Take 1 mg by mouth daily before breakfast.          . ipratropium (ATROVENT) 0.06 % nasal spray   Nasal   Place 1 spray into the nose daily.          Marland Kitchen  nitroGLYCERIN (NITROSTAT) 0.4 MG SL tablet   Sublingual   Place 0.4 mg under the tongue every 5 (five) minutes as needed.            . potassium chloride SA (K-DUR,KLOR-CON) 20 MEQ tablet   Oral   Take 2 tablets (40 mEq total) by mouth 2 (two) times daily.   120 tablet   6   . Thiamine HCl (B-1 PO)   Oral   Take 125 mcg by mouth daily.         Marland Kitchen warfarin (COUMADIN) 2.5 MG tablet   Oral   Take 1 tablet (2.5 mg total) by mouth as directed. Take As Directed per Anticoagulation Clinic   120 tablet   1     120 tablets is 90 day supply     BP 137/84  Pulse 90  Temp(Src) 98.2 F (36.8 C) (Oral)  Resp 16  SpO2 96%  Physical Exam  Nursing note and vitals reviewed. Constitutional: He is oriented to person, place, and time. He appears well-developed and well-nourished.  HENT:  Head: Normocephalic and atraumatic.  Eyes: EOM are normal. Pupils are equal, round, and reactive to light.  Neck: Normal range of motion. Neck supple.  Cardiovascular: Normal rate, normal heart sounds and intact distal pulses.   Pulmonary/Chest: Effort normal and breath sounds normal.  Abdominal: Bowel sounds are normal. He exhibits no distension. There is no tenderness.  Genitourinary:  Markedly edematous and tender scrotum and perineum with a 1cm x 2cm area of necrosis and drainage of foul smelling purulent material on the inferior scrotum  Musculoskeletal: Normal range of motion. He exhibits no edema and no tenderness.  Neurological: He is alert and oriented to person, place, and time. He has normal strength. No cranial nerve deficit or sensory deficit.  Skin: Skin is warm and dry. No rash noted.  Psychiatric: He has a normal mood and affect.    ED Course  Procedures (including critical care time)  Labs Reviewed  CBC - Abnormal; Notable for the following:    WBC 16.3 (*)    All other components within normal limits  BASIC METABOLIC PANEL - Abnormal; Notable for the following:    Glucose, Bld 189 (*)    BUN 25 (*)    GFR calc non Af Amer 54 (*)    GFR calc Af Amer 63 (*)    All other components within normal limits   PROTIME-INR - Abnormal; Notable for the following:    Prothrombin Time 19.2 (*)    INR 1.68 (*)    All other components within normal limits  CULTURE, BLOOD (ROUTINE X 2)  CULTURE, BLOOD (ROUTINE X 2)  APTT  URINALYSIS, ROUTINE W REFLEX MICROSCOPIC   Dg Chest 2 View  05/23/2012  *RADIOLOGY REPORT*  Clinical Data: Antral fibrillation  CHEST - 2 VIEW  Comparison: Chest radiograph 05/30/2011  Findings: Sternotomy wires overlie stable enlarged cardiac silhouette. There is stimulator devices within the central canal of the thoracic spine which is new from prior.  There is a left effusion and atelectasis is unchanged from prior. Bulky osteophytosis of the spine.  IMPRESSION:  1.  No significant interval change other than the hardware described. 2.  Left lower lobe atelectasis and small effusion. 3.  Cardiomegaly. 4. Degenerative change in the spine.   Original Report Authenticated By: Genevive Bi, M.D.    Ct Pelvis W Contrast  05/23/2012  *RADIOLOGY REPORT*  Clinical Data:  Fever.  Possible abscess of the thighs.  CT PELVIS WITH CONTRAST  Technique:  Multidetector CT imaging of the pelvis was performed using the standard protocol following the bolus administration of intravenous contrast.  Contrast: OMNIPAQUE IOHEXOL 300 MG/ML  SOLN  Comparison:   None.  Findings:  There is a large skin ulceration of the perineum at the base of the scrotum.  The ulceration may be packed.  No adjacent fluid collection is identified.  The patient does have bilateral hydroceles which appear large and greater on the right.  Upper thighs are unremarkable.  Imaged intrapelvic contents demonstrate no focal abnormality.  Atherosclerotic vascular disease is seen. The left common femoral artery is mildly dilated at 2.1 cm.  There is no lymphadenopathy or fluid within the pelvis.  No focal bony abnormality is identified.  The patient is status post lower lumbar fusion.  IMPRESSION:  1.  Large peroneal skin ulceration at the  base of the scrotum without underlying abscess. 2.  Large appearing bilateral hydroceles, greater on the right. 3.  Mild dilatation of the left common femoral artery 2.1 cm.   Original Report Authenticated By: Holley Dexter, M.D.      1. Fournier gangrene       MDM  Discussed the case with Dr. Lindie Spruce on call for General Surgery who was in the ED seeing another patient. He agrees with CT scan and antibiotics. Recommends Vit K for coumadin reversal, but no FFP at this point. He will return to re-evaluate with workup is completed.    Date: 05/23/2012  Rate: 85  Rhythm: V-paced  QRS Axis: indeterminate  Intervals: paced  ST/T Wave abnormalities: indeterminate  Conduction Disutrbances:paced  Narrative Interpretation:   Old EKG Reviewed: unchanged  7:58 PM CT neg for abscess. Pt to be treated conservatively with IV Abx, admit to Hospitalist. Surgery to follow      Charles B. Bernette Mayers, MD 05/23/12 1958

## 2012-05-23 NOTE — Progress Notes (Addendum)
ANTIBIOTIC CONSULT NOTE - INITIAL  Pharmacy Consult for vancomycin Indication: fournier's gangrene  Allergies  Allergen Reactions  . Avelox (Moxifloxacin Hcl In Nacl) Swelling, Rash and Other (See Comments)    Patient became hypotensive after infusion started  . Penicillins     REACTION: anaphylaxis    Patient Measurements:    Vital Signs: Temp: 98.2 F (36.8 C) (03/05 1450) Temp src: Oral (03/05 1450) BP: 137/84 mmHg (03/05 1450) Pulse Rate: 90 (03/05 1450) Intake/Output from previous day:   Intake/Output from this shift:    Labs:  Recent Labs  05/23/12 1509  WBC 16.3*  HGB 15.7  PLT 158   The CrCl is unknown because both a height and weight (above a minimum accepted value) are required for this calculation. No results found for this basename: VANCOTROUGH, VANCOPEAK, VANCORANDOM, GENTTROUGH, GENTPEAK, GENTRANDOM, TOBRATROUGH, TOBRAPEAK, TOBRARND, AMIKACINPEAK, AMIKACINTROU, AMIKACIN,  in the last 72 hours   Microbiology: No results found for this or any previous visit (from the past 720 hour(s)).  Medical History: Past Medical History  Diagnosis Date  . Diabetes mellitus   . Hyperlipidemia   . Hypertension   . Pilonidal cyst   . Atrial fibrillation     Previous long-term amiodarone therapy with multiple cardioversions / amiodarone stopped September, 2009  . Atrial flutter     Started November, 2010, Left-sided and cannot ablate  . Left atrial thrombus     Remote past... cardioversions done since that time  . Wide-complex tachycardia   . Left ventricular ejection fraction less than 40%   . Gout   . AAA (abdominal aortic aneurysm)     Surgical repair  . Discolored skin   . S/P ICD (internal cardiac defibrillator) procedure     Dr. Ladona Ridgel 2009... by the pacing  . SOB (shortness of breath)     Large left effusion/ thoracentesis/hospitalization/November, 2011... exudated.. cytology negative.. Dr.Wert.. no proof of mesothelioma  . Pericardial effusion   .  Pleural effusion     Large loculated effusion on the left side November, 2011. This was tapped. It was exudative. Cytology revealed no cancer no proof of mesothelioma area pulmonary team felt that no further workup was needed  . S/P AAA repair   . Spinal stenosis     Surgery Dr.Elsner  . CAD (coronary artery disease)     Catheterization July, 2008... name and vein grafts patent but low cardiac output  . Warfarin anticoagulation   . Cardiomyopathy     Ischemic... ICD  . CHF (congestive heart failure)     EF 30-40%... echo.. November, 2008  /  EF 55-60% echo... November, 2011  . Venous insufficiency     Toe discoloration chronic  . Mitral regurgitation     Mild echo  . Aortic valve sclerosis   . Nasal drainage     Chronic  . Alcohol ingestion of more than four drinks per week     Excess beer  not a dependency problem  . Chronotropic incompetence     IV pacing rate adjusted  . Thrombophlebitis of superficial veins of upper extremities     Possible venous stenosis from defibrillator  . Pericardial effusion     November, 2011 .. decreased during hospitalization  . Eye abnormality     Ophthalmologist questions a clot in one of his eyes, May, 2012  . Overweight     November, 2012  . Pleural thickening   . Ejection fraction < 50%     Ejection fraction has varied over  time from 35-50%.,, Echoes are technically very difficult,,, EF 50%, echo, May 25, 2011, technically very difficult  . Drug therapy     Redness and swelling with Avelox infusion May 24, 2011  . COPD (chronic obstructive pulmonary disease)   . Myocardial infarction   . Spinal cord stimulator status     October, 2013  . Carotid artery disease     Doppler, December, 2013, 0-39% bilateral    Medications:  Anti-infectives   Start     Dose/Rate Route Frequency Ordered Stop   05/23/12 1800  vancomycin (VANCOCIN) 2,000 mg in sodium chloride 0.9 % 500 mL IVPB     2,000 mg 250 mL/hr over 120 Minutes Intravenous NOW  05/23/12 1734 05/24/12 1800     Assessment: 72 yof presented to the ED with Fournier's gangrene. To start empiric vancomycin. Pt is obese but labs are still pending. Currently afebrile and elevated WBC.   Goal of Therapy:  Vancomycin trough level 10-15 mcg/ml  Plan:  1. Vanc 2gm IV x 1  2. F/u BMET for further dosing  Alante Tolan, Drake Leach 05/23/2012,5:34 PM  Addendum: BMET returned and CrCl ~1ml/min.  Plan: 1. After vanc load, start vanc 1gm IV Q12H 2. F/u renal fxn, C&S, clinical status and trough at [facility], PharmD, BCPS Pager # [phone] 05/23/2012 6:26 PM

## 2012-05-23 NOTE — Consult Note (Signed)
Reason for Consult:Posterior scrotal infection concerning for Fournier's gangrene Referring Physician: Mohd. Derflinger is an 72 y.o. male.  HPI: Patient with multiple significant medical problems including CAD, CHF, arrhytmias, AICD/Pacer, anticoagulated with scrotal infection that started Monday and has worsened to where it is draining foul-smelling fluid.  Indurated in the surrounding area.  WBC 16K but patient is afebrile.  CT scan does not demonstrate any abscess.    Past Medical History  Diagnosis Date  . Diabetes mellitus   . Hyperlipidemia   . Hypertension   . Pilonidal cyst   . Atrial fibrillation     Previous long-term amiodarone therapy with multiple cardioversions / amiodarone stopped September, 2009  . Atrial flutter     Started November, 2010, Left-sided and cannot ablate  . Left atrial thrombus     Remote past... cardioversions done since that time  . Wide-complex tachycardia   . Left ventricular ejection fraction less than 40%   . Gout   . AAA (abdominal aortic aneurysm)     Surgical repair  . Discolored skin   . S/P ICD (internal cardiac defibrillator) procedure     Dr. Ladona Ridgel 2009... by the pacing  . SOB (shortness of breath)     Large left effusion/ thoracentesis/hospitalization/November, 2011... exudated.. cytology negative.. Dr.Wert.. no proof of mesothelioma  . Pericardial effusion   . Pleural effusion     Large loculated effusion on the left side November, 2011. This was tapped. It was exudative. Cytology revealed no cancer no proof of mesothelioma area pulmonary team felt that no further workup was needed  . S/P AAA repair   . Spinal stenosis     Surgery Dr.Elsner  . CAD (coronary artery disease)     Catheterization July, 2008... name and vein grafts patent but low cardiac output  . Warfarin anticoagulation   . Cardiomyopathy     Ischemic... ICD  . CHF (congestive heart failure)     EF 30-40%... echo.. November, 2008  /  EF 55-60%  echo... November, 2011  . Venous insufficiency     Toe discoloration chronic  . Mitral regurgitation     Mild echo  . Aortic valve sclerosis   . Nasal drainage     Chronic  . Alcohol ingestion of more than four drinks per week     Excess beer  not a dependency problem  . Chronotropic incompetence     IV pacing rate adjusted  . Thrombophlebitis of superficial veins of upper extremities     Possible venous stenosis from defibrillator  . Pericardial effusion     November, 2011 .. decreased during hospitalization  . Eye abnormality     Ophthalmologist questions a clot in one of his eyes, May, 2012  . Overweight     November, 2012  . Pleural thickening   . Ejection fraction < 50%     Ejection fraction has varied over time from 35-50%.,, Echoes are technically very difficult,,, EF 50%, echo, May 25, 2011, technically very difficult  . Drug therapy     Redness and swelling with Avelox infusion May 24, 2011  . COPD (chronic obstructive pulmonary disease)   . Myocardial infarction   . Spinal cord stimulator status     October, 2013  . Carotid artery disease     Doppler, December, 2013, 0-39% bilateral    Past Surgical History  Procedure Laterality Date  . Colonoscopy w/ polypectomy    . Icd       insertion  .  Lumbar fusion    . Pilonidal cyst removal    . Surgery scrotal / testicular    . Coronary artery bypass graft  2004  . Abdominal aortic aneurysm repair  11/2002    Family History  Problem Relation Age of Onset  . Hypertension Mother   . Stroke Mother   . Diabetes Father   . Coronary artery disease Father   . Other Father     DVT    Social History:  reports that he quit smoking about 17 years ago. He has quit using smokeless tobacco. He reports that  drinks alcohol. He reports that he does not use illicit drugs.  Allergies:  Allergies  Allergen Reactions  . Avelox (Moxifloxacin Hcl In Nacl) Swelling, Rash and Other (See Comments)    Patient became hypotensive  after infusion started  . Penicillins     REACTION: anaphylaxis    Medications: I have reviewed the patient's current medications.  Results for orders placed during the hospital encounter of 05/23/12 (from the past 48 hour(s))  CBC     Status: Abnormal   Collection Time    05/23/12  3:09 PM      Result Value Range   WBC 16.3 (*) 4.0 - 10.5 K/uL   RBC 5.01  4.22 - 5.81 MIL/uL   Hemoglobin 15.7  13.0 - 17.0 g/dL   HCT 16.1  09.6 - 04.5 %   MCV 88.6  78.0 - 100.0 fL   MCH 31.3  26.0 - 34.0 pg   MCHC 35.4  30.0 - 36.0 g/dL   RDW 40.9  81.1 - 91.4 %   Platelets 158  150 - 400 K/uL  BASIC METABOLIC PANEL     Status: Abnormal   Collection Time    05/23/12  5:12 PM      Result Value Range   Sodium 137  135 - 145 mEq/L   Potassium 4.0  3.5 - 5.1 mEq/L   Chloride 99  96 - 112 mEq/L   CO2 24  19 - 32 mEq/L   Glucose, Bld 189 (*) 70 - 99 mg/dL   BUN 25 (*) 6 - 23 mg/dL   Creatinine, Ser 7.82  0.50 - 1.35 mg/dL   Calcium 9.2  8.4 - 95.6 mg/dL   GFR calc non Af Amer 54 (*) >90 mL/min   GFR calc Af Amer 63 (*) >90 mL/min   Comment:            The eGFR has been calculated     using the CKD EPI equation.     This calculation has not been     validated in all clinical     situations.     eGFR's persistently     <90 mL/min signify     possible Chronic Kidney Disease.  PROTIME-INR     Status: Abnormal   Collection Time    05/23/12  5:12 PM      Result Value Range   Prothrombin Time 19.2 (*) 11.6 - 15.2 seconds   INR 1.68 (*) 0.00 - 1.49  APTT     Status: None   Collection Time    05/23/12  5:12 PM      Result Value Range   aPTT 34  24 - 37 seconds  URINALYSIS, ROUTINE W REFLEX MICROSCOPIC     Status: None   Collection Time    05/23/12  7:00 PM      Result Value Range   Color, Urine YELLOW  YELLOW   APPearance CLEAR  CLEAR   Specific Gravity, Urine 1.018  1.005 - 1.030   pH 5.0  5.0 - 8.0   Glucose, UA NEGATIVE  NEGATIVE mg/dL   Hgb urine dipstick NEGATIVE  NEGATIVE    Bilirubin Urine NEGATIVE  NEGATIVE   Ketones, ur NEGATIVE  NEGATIVE mg/dL   Protein, ur NEGATIVE  NEGATIVE mg/dL   Urobilinogen, UA 1.0  0.0 - 1.0 mg/dL   Nitrite NEGATIVE  NEGATIVE   Leukocytes, UA NEGATIVE  NEGATIVE   Comment: MICROSCOPIC NOT DONE ON URINES WITH NEGATIVE PROTEIN, BLOOD, LEUKOCYTES, NITRITE, OR GLUCOSE <1000 mg/dL.    Dg Chest 2 View  05/23/2012  *RADIOLOGY REPORT*  Clinical Data: Antral fibrillation  CHEST - 2 VIEW  Comparison: Chest radiograph 05/30/2011  Findings: Sternotomy wires overlie stable enlarged cardiac silhouette. There is stimulator devices within the central canal of the thoracic spine which is new from prior.  There is a left effusion and atelectasis is unchanged from prior. Bulky osteophytosis of the spine.  IMPRESSION:  1.  No significant interval change other than the hardware described. 2.  Left lower lobe atelectasis and small effusion. 3.  Cardiomegaly. 4. Degenerative change in the spine.   Original Report Authenticated By: Genevive Bi, M.D.    Ct Pelvis W Contrast  05/23/2012  *RADIOLOGY REPORT*  Clinical Data:  Fever.  Possible abscess of the thighs.  CT PELVIS WITH CONTRAST  Technique:  Multidetector CT imaging of the pelvis was performed using the standard protocol following the bolus administration of intravenous contrast.  Contrast: OMNIPAQUE IOHEXOL 300 MG/ML  SOLN  Comparison:   None.  Findings:  There is a large skin ulceration of the perineum at the base of the scrotum.  The ulceration may be packed.  No adjacent fluid collection is identified.  The patient does have bilateral hydroceles which appear large and greater on the right.  Upper thighs are unremarkable.  Imaged intrapelvic contents demonstrate no focal abnormality.  Atherosclerotic vascular disease is seen. The left common femoral artery is mildly dilated at 2.1 cm.  There is no lymphadenopathy or fluid within the pelvis.  No focal bony abnormality is identified.  The patient is status  post lower lumbar fusion.  IMPRESSION:  1.  Large peroneal skin ulceration at the base of the scrotum without underlying abscess. 2.  Large appearing bilateral hydroceles, greater on the right. 3.  Mild dilatation of the left common femoral artery 2.1 cm.   Original Report Authenticated By: Holley Dexter, M.D.     ROS Blood pressure 137/84, pulse 90, temperature 98.2 F (36.8 C), temperature source Oral, resp. rate 16, height 6' 0.05" (1.83 m), weight 112.5 kg (248 lb 0.3 oz), SpO2 96.00%. Physical Exam  Constitutional: He is oriented to person, place, and time. He appears well-developed and well-nourished.    HENT:  Head: Normocephalic and atraumatic.  Eyes: Conjunctivae and EOM are normal. Pupils are equal, round, and reactive to light.  Neck: Normal range of motion. Neck supple.  GI: Soft. He exhibits distension.  Genitourinary:     Neurological: He is alert and oriented to person, place, and time. He has normal reflexes.  Psychiatric: He has a normal mood and affect. His behavior is normal. Judgment and thought content normal.    Assessment/Plan: Necrotizing soft tissue infection with drainage. Patient is not septic and has significant risks. INR is okay, but mildly elevated. CT does not show abscess, therefore patient does not need surgical intervention at this  time.  He may require intervention in the next several days if he does not respond to the IV vancomycin and clindamycin.Cherylynn Ridges 05/23/2012, 7:53 PM

## 2012-05-24 DIAGNOSIS — N498 Inflammatory disorders of other specified male genital organs: Secondary | ICD-10-CM

## 2012-05-24 DIAGNOSIS — I4891 Unspecified atrial fibrillation: Secondary | ICD-10-CM

## 2012-05-24 LAB — GLUCOSE, CAPILLARY
Glucose-Capillary: 133 mg/dL — ABNORMAL HIGH (ref 70–99)
Glucose-Capillary: 151 mg/dL — ABNORMAL HIGH (ref 70–99)

## 2012-05-24 MED ORDER — SODIUM CHLORIDE 0.9 % IV SOLN
INTRAVENOUS | Status: DC
  Administered 2012-05-24: 08:00:00 via INTRAVENOUS

## 2012-05-24 MED ORDER — SODIUM CHLORIDE 0.9 % IJ SOLN
3.0000 mL | Freq: Two times a day (BID) | INTRAMUSCULAR | Status: DC
  Administered 2012-05-25: 3 mL via INTRAVENOUS

## 2012-05-24 MED ORDER — SODIUM CHLORIDE 0.9 % IV SOLN: 250.0000 mL | INTRAVENOUS | Status: DC | PRN

## 2012-05-24 MED ORDER — SODIUM CHLORIDE 0.9 % IJ SOLN: 3.0000 mL | INTRAMUSCULAR | Status: DC | PRN

## 2012-05-24 NOTE — Progress Notes (Signed)
[name] [id number] 1940/08/06   Subjective:  Feeling better Draining from scrotum  Objective:  Vital signs:  Filed Vitals:   05/23/12 2100 05/23/12 2200 05/23/12 2316 05/24/12 0455  BP: 135/91 112/72 113/78 107/74  Pulse: 98 84 88 84  Temp:   98.5 F (36.9 C) 98.3 F (36.8 C)  TempSrc:   Oral Oral  Resp: 21 15 20 20   Height:      Weight:   249 lb 1.9 oz (113 kg) 249 lb 9 oz (113.2 kg)  SpO2: 98% 97% 97% 97%    Last BM Date: 05/23/12  Intake/Output   Yesterday:  03/05 0701 - 03/06 0700 In: -  Out: 875 [Urine:875] This shift:     Bowel function:  Flatus: y  BM: y  Physical Exam:  General: Pt awake/alert/oriented x4 in no acute distress Eyes: PERRL, normal EOM.  Sclera clear.  No icterus Neuro: CN II-XII intact w/o focal sensory/motor deficits. Lymph: No head/neck/groin lymphadenopathy Psych:  No delerium/psychosis/paranoia HENT: Normocephalic, Mucus membranes moist.  No thrush Neck: Supple, No tracheal deviation Chest: No chest wall pain w good excursion CV:  Pulses intact.  Regular rhythm MS: Normal AROM mjr joints.  No obvious deformity Abdomen: Soft.  Obese.  Nondistended.   No incarcerated hernias. GU:  Mild cellulitis of upper scrotum - more intense posteriorly.  Draining wound.  6x3x3cm induration Ext:  SCDs BLE.  No mjr edema.  No cyanosis Skin: No petechiae / purpurae  Problem List:  Principal Problem:   Cellulitis of scrotum Active Problems:   DIABETES MELLITUS, CONTROLLED   CKD (chronic kidney disease), stage III   BACK PAIN, LUMBAR   Hypertension   S/P ICD (internal cardiac defibrillator) procedure   Warfarin anticoagulation   Atrial fibrillation   Cardiomyopathy   Encounter for long-term (current) use of anticoagulants   Renal insufficiency   Carotid artery disease   Assessment  [name]  72 y.o. male       Scrotal abscess w cellulitis.  Draining  Plan:  -cont IV ABx -heat/drainage -If  worse, I&D in OR. -glucose control -VTE prophylaxis- SCDs, etc -mobilize as tolerated to help recovery  The patient is stable.  There is no evidence of peritonitis, acute abdomen, nor shock.  There is no strong evidence of failure of improvement nor decline with current non-operative management.  There is no need for surgery at the present moment.  We will continue to follow.   Ardeth Sportsman, M.D., F.A.C.S. Gastrointestinal and Minimally Invasive Surgery [facility] [address], [address] [place], [place] [address] [phone] Main / Paging [phone] Voice Mail   05/24/2012  CARE TEAM:  PCP: [name], MD  Outpatient Care Team: Patient Care Team: [name], MD as PCP - General Chuck Hint, MD as Attending Physician (Vascular Surgery) [name], MD (Cardiology) Marinus Maw, MD (Cardiology) Gwynne Edinger, MD (Pain Medicine)  Inpatient Treatment Team: Treatment Team: Attending Jahden Schara: Kathlen Mody, MD; Consulting Physician: Bishop Limbo, MD; Rounding Team: Sibyl Parr, MD   Results:   Labs: Results for orders placed during the hospital encounter of 05/23/12 (from the past 48 hour(s))  CBC     Status: Abnormal   Collection Time    05/23/12  3:09 PM      Result Value Range   WBC 16.3 (*) 4.0 - 10.5 K/uL   RBC 5.01  4.22 - 5.81 MIL/uL   Hemoglobin 15.7  13.0 -  17.0 g/dL   HCT 09.8  11.9 - 14.7 %   MCV 88.6  78.0 - 100.0 fL   MCH 31.3  26.0 - 34.0 pg   MCHC 35.4  30.0 - 36.0 g/dL   RDW 82.9  56.2 - 13.0 %   Platelets 158  150 - 400 K/uL  BASIC METABOLIC PANEL     Status: Abnormal   Collection Time    05/23/12  5:12 PM      Result Value Range   Sodium 137  135 - 145 mEq/L   Potassium 4.0  3.5 - 5.1 mEq/L   Chloride 99  96 - 112 mEq/L   CO2 24  19 - 32 mEq/L   Glucose, Bld 189 (*) 70 - 99 mg/dL   BUN 25 (*) 6 - 23 mg/dL   Creatinine, Ser 8.65  0.50 - 1.35 mg/dL   Calcium 9.2  8.4 - 78.4 mg/dL   GFR calc  non Af Amer 54 (*) >90 mL/min   GFR calc Af Amer 63 (*) >90 mL/min   Comment:            The eGFR has been calculated     using the CKD EPI equation.     This calculation has not been     validated in all clinical     situations.     eGFR's persistently     <90 mL/min signify     possible Chronic Kidney Disease.  PROTIME-INR     Status: Abnormal   Collection Time    05/23/12  5:12 PM      Result Value Range   Prothrombin Time 19.2 (*) 11.6 - 15.2 seconds   INR 1.68 (*) 0.00 - 1.49  APTT     Status: None   Collection Time    05/23/12  5:12 PM      Result Value Range   aPTT 34  24 - 37 seconds  URINALYSIS, ROUTINE W REFLEX MICROSCOPIC     Status: None   Collection Time    05/23/12  7:00 PM      Result Value Range   Color, Urine YELLOW  YELLOW   APPearance CLEAR  CLEAR   Specific Gravity, Urine 1.018  1.005 - 1.030   pH 5.0  5.0 - 8.0   Glucose, UA NEGATIVE  NEGATIVE mg/dL   Hgb urine dipstick NEGATIVE  NEGATIVE   Bilirubin Urine NEGATIVE  NEGATIVE   Ketones, ur NEGATIVE  NEGATIVE mg/dL   Protein, ur NEGATIVE  NEGATIVE mg/dL   Urobilinogen, UA 1.0  0.0 - 1.0 mg/dL   Nitrite NEGATIVE  NEGATIVE   Leukocytes, UA NEGATIVE  NEGATIVE   Comment: MICROSCOPIC NOT DONE ON URINES WITH NEGATIVE PROTEIN, BLOOD, LEUKOCYTES, NITRITE, OR GLUCOSE <1000 mg/dL.  GLUCOSE, CAPILLARY     Status: Abnormal   Collection Time    05/24/12 12:13 AM      Result Value Range   Glucose-Capillary 151 (*) 70 - 99 mg/dL   Comment 1 Notify RN     Comment 2 Documented in Chart    GLUCOSE, CAPILLARY     Status: Abnormal   Collection Time    05/24/12  4:12 AM      Result Value Range   Glucose-Capillary 133 (*) 70 - 99 mg/dL   Comment 1 Notify RN      Imaging / Studies: Dg Chest 2 View  05/23/2012  *RADIOLOGY REPORT*  Clinical Data: Antral fibrillation  CHEST - 2 VIEW  Comparison:  Chest radiograph 05/30/2011  Findings: Sternotomy wires overlie stable enlarged cardiac silhouette. There is stimulator  devices within the central canal of the thoracic spine which is new from prior.  There is a left effusion and atelectasis is unchanged from prior. Bulky osteophytosis of the spine.  IMPRESSION:  1.  No significant interval change other than the hardware described. 2.  Left lower lobe atelectasis and small effusion. 3.  Cardiomegaly. 4. Degenerative change in the spine.   Original Report Authenticated By: Genevive Bi, M.D.    Ct Pelvis W Contrast  05/23/2012  *RADIOLOGY REPORT*  Clinical Data:  Fever.  Possible abscess of the thighs.  CT PELVIS WITH CONTRAST  Technique:  Multidetector CT imaging of the pelvis was performed using the standard protocol following the bolus administration of intravenous contrast.  Contrast: OMNIPAQUE IOHEXOL 300 MG/ML  SOLN  Comparison:   None.  Findings:  There is a large skin ulceration of the perineum at the base of the scrotum.  The ulceration may be packed.  No adjacent fluid collection is identified.  The patient does have bilateral hydroceles which appear large and greater on the right.  Upper thighs are unremarkable.  Imaged intrapelvic contents demonstrate no focal abnormality.  Atherosclerotic vascular disease is seen. The left common femoral artery is mildly dilated at 2.1 cm.  There is no lymphadenopathy or fluid within the pelvis.  No focal bony abnormality is identified.  The patient is status post lower lumbar fusion.  IMPRESSION:  1.  Large peroneal skin ulceration at the base of the scrotum without underlying abscess. 2.  Large appearing bilateral hydroceles, greater on the right. 3.  Mild dilatation of the left common femoral artery 2.1 cm.   Original Report Authenticated By: Holley Dexter, M.D.     Medications / Allergies: per chart  Antibiotics: Anti-infectives   Start     Dose/Rate Route Frequency Ordered Stop   05/24/12 0800  vancomycin (VANCOCIN) IVPB 1000 mg/200 mL premix     1,000 mg 200 mL/hr over 60 Minutes Intravenous Every 12 hours  05/23/12 2356     05/24/12 0600  clindamycin (CLEOCIN) IVPB 900 mg     900 mg 100 mL/hr over 30 Minutes Intravenous 3 times per day 05/23/12 2356     05/23/12 1830  clindamycin (CLEOCIN) IVPB 600 mg     600 mg 100 mL/hr over 30 Minutes Intravenous  Once 05/23/12 1824 05/23/12 2037   05/23/12 1800  vancomycin (VANCOCIN) 2,000 mg in sodium chloride 0.9 % 500 mL IVPB     2,000 mg 250 mL/hr over 120 Minutes Intravenous NOW 05/23/12 1734 05/23/12 2237

## 2012-05-24 NOTE — Telephone Encounter (Signed)
In hospital. 

## 2012-05-24 NOTE — Progress Notes (Signed)
[facility] PROGRESS NOTE  Linsey Hirota UJW:[id number] DOB: 11/25/1940 DOA: 05/23/2012 PCP: [name], MD  Assessment/Plan: 1. Scrotal cellulitis: s/p I&D , draining. On IV antibiotics. Surgery onboard.   2. Acute on chronic renal insufficiency:   3. Hypertension: controlled. Resume home medications.   4. Atrial fibrillation: rate controlled. Anticoagulation on hold for possible surgery in the near future.   5. Diabetes Mellitus:  CBG (last 3)   Recent Labs  05/24/12 0412 05/24/12 0821 05/24/12 1102  GLUCAP 133* 113* 158*    Resume homemedications  And SSI.   6. dvt prophylaxis: scd's  Code Status: full code Family Communication: wife at bedside Disposition Plan: pending   Consultants:  surgery  Procedures:  I&D  Antibiotics:  Vancomycin  clindamycin  HPI/Subjective: Pain is better.   Objective: Filed Vitals:   05/23/12 2316 05/24/12 0455 05/24/12 0800 05/24/12 1358  BP:  107/74 130/60 117/72  Pulse:  84 76 76  Temp: 98.5 F (36.9 C) 98.3 F (36.8 C)  98.6 F (37 C)  TempSrc: Oral Oral  Oral  Resp: 20 20  20   Height:      Weight: 113 kg (249 lb 1.9 oz) 113.2 kg (249 lb 9 oz)    SpO2: 97% 97%  98%    Intake/Output Summary (Last 24 hours) at 05/24/12 1517 Last data filed at 05/24/12 1359  Gross per 24 hour  Intake    580 ml  Output   1126 ml  Net   -546 ml   Filed Weights   05/23/12 1700 05/23/12 2316 05/24/12 0455  Weight: 112.5 kg (248 lb 0.3 oz) 113 kg (249 lb 1.9 oz) 113.2 kg (249 lb 9 oz)    Exam: Alert afebrile comfortable CHEST: Normal respiration, clear to auscultation bilaterally.  HEART: Regular rate and rhythm. There are no murmur, rub, or gallops.  BACK: No kyphosis or scoliosis; no CVA tenderness  ABDOMEN: soft and non-tender; no masses, no organomegaly, normal abdominal bowel sounds; no pannus; no intertriginous candida. There is no rebound and no distention.  EXTREMITIES: No bone or joint deformity;  age-appropriate arthropathy of the hands and knees; no edema; no ulcerations. There is no calf tenderness     Data Reviewed: Basic Metabolic Panel:  Recent Labs Lab 05/23/12 1712  NA 137  K 4.0  CL 99  CO2 24  GLUCOSE 189*  BUN 25*  CREATININE 1.29  CALCIUM 9.2   Liver Function Tests: No results found for this basename: AST, ALT, ALKPHOS, BILITOT, PROT, ALBUMIN,  in the last 168 hours No results found for this basename: LIPASE, AMYLASE,  in the last 168 hours No results found for this basename: AMMONIA,  in the last 168 hours CBC:  Recent Labs Lab 05/23/12 1509  WBC 16.3*  HGB 15.7  HCT 44.4  MCV 88.6  PLT 158   Cardiac Enzymes: No results found for this basename: CKTOTAL, CKMB, CKMBINDEX, TROPONINI,  in the last 168 hours BNP (last 3 results) No results found for this basename: PROBNP,  in the last 8760 hours CBG:  Recent Labs Lab 05/24/12 0013 05/24/12 0412 05/24/12 0821 05/24/12 1102  GLUCAP 151* 133* 113* 158*    No results found for this or any previous visit (from the past 240 hour(s)).   Studies: Dg Chest 2 View  05/23/2012  *RADIOLOGY REPORT*  Clinical Data: Antral fibrillation  CHEST - 2 VIEW  Comparison: Chest radiograph 05/30/2011  Findings: Sternotomy wires overlie stable enlarged cardiac silhouette. There is stimulator devices  within the central canal of the thoracic spine which is new from prior.  There is a left effusion and atelectasis is unchanged from prior. Bulky osteophytosis of the spine.  IMPRESSION:  1.  No significant interval change other than the hardware described. 2.  Left lower lobe atelectasis and small effusion. 3.  Cardiomegaly. 4. Degenerative change in the spine.   Original Report Authenticated By: Genevive Bi, M.D.    Ct Pelvis W Contrast  05/23/2012  *RADIOLOGY REPORT*  Clinical Data:  Fever.  Possible abscess of the thighs.  CT PELVIS WITH CONTRAST  Technique:  Multidetector CT imaging of the pelvis was performed using  the standard protocol following the bolus administration of intravenous contrast.  Contrast: OMNIPAQUE IOHEXOL 300 MG/ML  SOLN  Comparison:   None.  Findings:  There is a large skin ulceration of the perineum at the base of the scrotum.  The ulceration may be packed.  No adjacent fluid collection is identified.  The patient does have bilateral hydroceles which appear large and greater on the right.  Upper thighs are unremarkable.  Imaged intrapelvic contents demonstrate no focal abnormality.  Atherosclerotic vascular disease is seen. The left common femoral artery is mildly dilated at 2.1 cm.  There is no lymphadenopathy or fluid within the pelvis.  No focal bony abnormality is identified.  The patient is status post lower lumbar fusion.  IMPRESSION:  1.  Large peroneal skin ulceration at the base of the scrotum without underlying abscess. 2.  Large appearing bilateral hydroceles, greater on the right. 3.  Mild dilatation of the left common femoral artery 2.1 cm.   Original Report Authenticated By: Holley Dexter, M.D.     Scheduled Meds: . allopurinol  50 mg Oral Daily  . amLODipine  5 mg Oral BID  . aspirin  81 mg Oral Weekly  . atorvastatin  10 mg Oral Daily  . carvedilol  12.5 mg Oral BID WC  . clindamycin (CLEOCIN) IV  900 mg Intravenous Q8H  . docusate sodium  100 mg Oral BID  . gabapentin  100 mg Oral BID  . glimepiride  1 mg Oral QAC breakfast  . insulin aspart  0-15 Units Subcutaneous Q4H  . ipratropium  1 spray Nasal Daily  . sodium chloride  3 mL Intravenous Q12H  . sodium chloride  3 mL Intravenous Q12H  . vancomycin  1,000 mg Intravenous Q12H   Continuous Infusions: . sodium chloride 50 mL/hr at 05/24/12 0813    Principal Problem:   Cellulitis of scrotum Active Problems:   DIABETES MELLITUS, CONTROLLED   CKD (chronic kidney disease), stage III   BACK PAIN, LUMBAR   Hypertension   S/P ICD (internal cardiac defibrillator) procedure   Warfarin anticoagulation    Atrial fibrillation   Cardiomyopathy   Encounter for long-term (current) use of anticoagulants   Renal insufficiency   Carotid artery disease    20 minutes     Francys Bolin  [facility] Pager [phone]. If 7PM-7AM, please contact night-coverage at [email or web address], password [facility] 05/24/2012, 3:17 PM  LOS: 1 day

## 2012-05-24 NOTE — Progress Notes (Signed)
UR Chart Review Completed  

## 2012-05-25 ENCOUNTER — Inpatient Hospital Stay (HOSPITAL_COMMUNITY): Payer: Medicare Other

## 2012-05-25 DIAGNOSIS — N289 Disorder of kidney and ureter, unspecified: Secondary | ICD-10-CM

## 2012-05-25 LAB — CBC
HCT: 38.2 % — ABNORMAL LOW (ref 39.0–52.0)
Hemoglobin: 13.4 g/dL (ref 13.0–17.0)
MCH: 30.8 pg (ref 26.0–34.0)
MCHC: 35.1 g/dL (ref 30.0–36.0)
MCV: 87.8 fL (ref 78.0–100.0)

## 2012-05-25 LAB — GLUCOSE, CAPILLARY
Glucose-Capillary: 120 mg/dL — ABNORMAL HIGH (ref 70–99)
Glucose-Capillary: 141 mg/dL — ABNORMAL HIGH (ref 70–99)

## 2012-05-25 NOTE — Progress Notes (Signed)
Patient ID: [name], male   DOB: 1940/11/03, 72 y.o.   MRN: [id number]    Subjective: He's feeling gradually better but still some discomfort and tenderness on his posterior scrotum  Objective: Vital signs in last 24 hours: Temp:  [97.2 F (36.2 C)-98.7 F (37.1 C)] 98.7 F (37.1 C) (03/07 1326) Pulse Rate:  [71-86] 71 (03/07 1326) Resp:  [18-20] 20 (03/07 1326) BP: (117-128)/(70-81) 118/70 mmHg (03/07 1326) SpO2:  [95 %-98 %] 96 % (03/07 1326) Weight:  [251 lb 6.4 oz (114.034 kg)] 251 lb 6.4 oz (114.034 kg) (03/07 0518) Last BM Date: 05/24/12  Intake/Output from previous day: 03/06 0701 - 03/07 0700 In: 1751.7 [P.O.:960; I.V.:241.7; IV Piggyback:550] Out: 1201 [Urine:1200; Stool:1] Intake/Output this shift: Total I/O In: 420 [P.O.:420] Out: 750 [Urine:750]  General appearance: alert, cooperative and no distress Incision/Wound: examination of the scrotal wounds shows some necrotic debris in the wound and some mild surrounding induration and erythema which seems improved. Using gauze I was able to bluntly debride a good-sized piece of necrotic tissue from the wound which likely represents a sebaceous sac cyst. At this point the wound was much more widely open with what appeared to be just superficial exudate.  Lab Results:   Recent Labs  05/23/12 1509 05/25/12 0645  WBC 16.3* 11.2*  HGB 15.7 13.4  HCT 44.4 38.2*  PLT 158 214   BMET  Recent Labs  05/23/12 1712  NA 137  K 4.0  CL 99  CO2 24  GLUCOSE 189*  BUN 25*  CREATININE 1.29  CALCIUM 9.2     Studies/Results: Dg Chest 2 View  05/23/2012  *RADIOLOGY REPORT*  Clinical Data: Antral fibrillation  CHEST - 2 VIEW  Comparison: Chest radiograph 05/30/2011  Findings: Sternotomy wires overlie stable enlarged cardiac silhouette. There is stimulator devices within the central canal of the thoracic spine which is new from prior.  There is a left effusion and atelectasis is unchanged from prior. Bulky  osteophytosis of the spine.  IMPRESSION:  1.  No significant interval change other than the hardware described. 2.  Left lower lobe atelectasis and small effusion. 3.  Cardiomegaly. 4. Degenerative change in the spine.   Original Report Authenticated By: Genevive Bi, M.D.    Ct Pelvis W Contrast  05/23/2012  *RADIOLOGY REPORT*  Clinical Data:  Fever.  Possible abscess of the thighs.  CT PELVIS WITH CONTRAST  Technique:  Multidetector CT imaging of the pelvis was performed using the standard protocol following the bolus administration of intravenous contrast.  Contrast: OMNIPAQUE IOHEXOL 300 MG/ML  SOLN  Comparison:   None.  Findings:  There is a large skin ulceration of the perineum at the base of the scrotum.  The ulceration may be packed.  No adjacent fluid collection is identified.  The patient does have bilateral hydroceles which appear large and greater on the right.  Upper thighs are unremarkable.  Imaged intrapelvic contents demonstrate no focal abnormality.  Atherosclerotic vascular disease is seen. The left common femoral artery is mildly dilated at 2.1 cm.  There is no lymphadenopathy or fluid within the pelvis.  No focal bony abnormality is identified.  The patient is status post lower lumbar fusion.  IMPRESSION:  1.  Large peroneal skin ulceration at the base of the scrotum without underlying abscess. 2.  Large appearing bilateral hydroceles, greater on the right. 3.  Mild dilatation of the left common femoral artery 2.1 cm.   Original Report Authenticated By: Holley Dexter, M.D.  Anti-infectives: Anti-infectives   Start     Dose/Rate Route Frequency Ordered Stop   05/24/12 0800  vancomycin (VANCOCIN) IVPB 1000 mg/200 mL premix     1,000 mg 200 mL/hr over 60 Minutes Intravenous Every 12 hours 05/23/12 2356     05/24/12 0600  clindamycin (CLEOCIN) IVPB 900 mg     900 mg 100 mL/hr over 30 Minutes Intravenous 3 times per day 05/23/12 2356     05/23/12 1830  clindamycin  (CLEOCIN) IVPB 600 mg     600 mg 100 mL/hr over 30 Minutes Intravenous  Once 05/23/12 1824 05/23/12 2037   05/23/12 1800  vancomycin (VANCOCIN) 2,000 mg in sodium chloride 0.9 % 500 mL IVPB     2,000 mg 250 mL/hr over 120 Minutes Intravenous NOW 05/23/12 1734 05/23/12 2237      Assessment/Plan: Appear ruptured infected sebaceous cyst of the scrotum. Improving on antibiotics and I was able to do some significant debridement at the bedside today. Continue current care and we will follow the wound.    LOS: 2 days    Angeleen Horney T 05/25/2012

## 2012-05-25 NOTE — Progress Notes (Signed)
[facility] PROGRESS NOTE  Kinte Trim ZOX:[id number] DOB: 01-19-1941 DOA: 05/23/2012 PCP: [name], MD  Assessment/Plan: 1. Scrotal cellulitis: s/p I&D , draining. On IV antibiotics. Surgery onboard.   2.  Chronic renal insufficiency: stable.   3. Hypertension: controlled. Resume home medications.   4. Atrial fibrillation: rate controlled. Anticoagulation on hold for possible surgery in the near future.   5. Diabetes Mellitus:  CBG (last 3)   Recent Labs  05/24/12 2350 05/25/12 0410 05/25/12 0847  GLUCAP 80 114* 120*    Resume homemedications  And SSI.   6. Dvt prophylaxis: scd's  Code Status: full code Family Communication: none at bedside Disposition Plan: pending   Consultants:  surgery  Procedures:  I&D  Antibiotics:  Vancomycin  clindamycin  HPI/Subjective: Pain is better.   Objective: Filed Vitals:   05/24/12 1358 05/24/12 2022 05/24/12 2153 05/25/12 0518  BP: 117/72 128/77 128/74 117/81  Pulse: 76 72  86  Temp: 98.6 F (37 C) 98 F (36.7 C)  97.2 F (36.2 C)  TempSrc: Oral Oral  Oral  Resp: 20 18  19   Height:      Weight:    114.034 kg (251 lb 6.4 oz)  SpO2: 98% 95%  98%    Intake/Output Summary (Last 24 hours) at 05/25/12 0948 Last data filed at 05/25/12 0930  Gross per 24 hour  Intake 1751.67 ml  Output   1501 ml  Net 250.67 ml   Filed Weights   05/23/12 2316 05/24/12 0455 05/25/12 0518  Weight: 113 kg (249 lb 1.9 oz) 113.2 kg (249 lb 9 oz) 114.034 kg (251 lb 6.4 oz)    Exam: Alert afebrile comfortable CHEST: Normal respiration, clear to auscultation bilaterally.  HEART: Regular rate and rhythm. There are no murmur, rub, or gallops.  BACK: No kyphosis or scoliosis; no CVA tenderness  ABDOMEN: soft and non-tender; no masses, no organomegaly, normal abdominal bowel sounds; no pannus; no intertriginous candida. There is no rebound and no distention.  EXTREMITIES: No bone or joint deformity;  age-appropriate arthropathy of the hands and knees; no edema; no ulcerations. There is no calf tenderness     Data Reviewed: Basic Metabolic Panel:  Recent Labs Lab 05/23/12 1712  NA 137  K 4.0  CL 99  CO2 24  GLUCOSE 189*  BUN 25*  CREATININE 1.29  CALCIUM 9.2   Liver Function Tests: No results found for this basename: AST, ALT, ALKPHOS, BILITOT, PROT, ALBUMIN,  in the last 168 hours No results found for this basename: LIPASE, AMYLASE,  in the last 168 hours No results found for this basename: AMMONIA,  in the last 168 hours CBC:  Recent Labs Lab 05/23/12 1509 05/25/12 0645  WBC 16.3* 11.2*  HGB 15.7 13.4  HCT 44.4 38.2*  MCV 88.6 87.8  PLT 158 214   Cardiac Enzymes: No results found for this basename: CKTOTAL, CKMB, CKMBINDEX, TROPONINI,  in the last 168 hours BNP (last 3 results) No results found for this basename: PROBNP,  in the last 8760 hours CBG:  Recent Labs Lab 05/24/12 1617 05/24/12 2024 05/24/12 2350 05/25/12 0410 05/25/12 0847  GLUCAP 103* 135* 80 114* 120*    Recent Results (from the past 240 hour(s))  CULTURE, BLOOD (ROUTINE X 2)     Status: None   Collection Time    05/23/12  5:15 PM      Result Value Range Status   Specimen Description BLOOD RIGHT ANTECUBITAL   Final   Special Requests BOTTLES  DRAWN AEROBIC AND ANAEROBIC 10CC   Final   Culture  Setup Time 05/24/2012 00:59   Final   Culture     Final   Value:        BLOOD CULTURE RECEIVED NO GROWTH TO DATE CULTURE WILL BE HELD FOR 5 DAYS BEFORE ISSUING A FINAL NEGATIVE REPORT   Report Status PENDING   Incomplete  CULTURE, BLOOD (ROUTINE X 2)     Status: None   Collection Time    05/23/12  5:30 PM      Result Value Range Status   Specimen Description BLOOD ARM RIGHT   Final   Special Requests BOTTLES DRAWN AEROBIC AND ANAEROBIC 10CC   Final   Culture  Setup Time 05/24/2012 00:59   Final   Culture     Final   Value:        BLOOD CULTURE RECEIVED NO GROWTH TO DATE CULTURE WILL BE  HELD FOR 5 DAYS BEFORE ISSUING A FINAL NEGATIVE REPORT   Report Status PENDING   Incomplete     Studies: Dg Chest 2 View  05/23/2012  *RADIOLOGY REPORT*  Clinical Data: Antral fibrillation  CHEST - 2 VIEW  Comparison: Chest radiograph 05/30/2011  Findings: Sternotomy wires overlie stable enlarged cardiac silhouette. There is stimulator devices within the central canal of the thoracic spine which is new from prior.  There is a left effusion and atelectasis is unchanged from prior. Bulky osteophytosis of the spine.  IMPRESSION:  1.  No significant interval change other than the hardware described. 2.  Left lower lobe atelectasis and small effusion. 3.  Cardiomegaly. 4. Degenerative change in the spine.   Original Report Authenticated By: Genevive Bi, M.D.    Ct Pelvis W Contrast  05/23/2012  *RADIOLOGY REPORT*  Clinical Data:  Fever.  Possible abscess of the thighs.  CT PELVIS WITH CONTRAST  Technique:  Multidetector CT imaging of the pelvis was performed using the standard protocol following the bolus administration of intravenous contrast.  Contrast: OMNIPAQUE IOHEXOL 300 MG/ML  SOLN  Comparison:   None.  Findings:  There is a large skin ulceration of the perineum at the base of the scrotum.  The ulceration may be packed.  No adjacent fluid collection is identified.  The patient does have bilateral hydroceles which appear large and greater on the right.  Upper thighs are unremarkable.  Imaged intrapelvic contents demonstrate no focal abnormality.  Atherosclerotic vascular disease is seen. The left common femoral artery is mildly dilated at 2.1 cm.  There is no lymphadenopathy or fluid within the pelvis.  No focal bony abnormality is identified.  The patient is status post lower lumbar fusion.  IMPRESSION:  1.  Large peroneal skin ulceration at the base of the scrotum without underlying abscess. 2.  Large appearing bilateral hydroceles, greater on the right. 3.  Mild dilatation of the left common  femoral artery 2.1 cm.   Original Report Authenticated By: Holley Dexter, M.D.     Scheduled Meds: . allopurinol  50 mg Oral Daily  . amLODipine  5 mg Oral BID  . aspirin  81 mg Oral Weekly  . atorvastatin  10 mg Oral Daily  . carvedilol  12.5 mg Oral BID WC  . clindamycin (CLEOCIN) IV  900 mg Intravenous Q8H  . docusate sodium  100 mg Oral BID  . gabapentin  100 mg Oral BID  . glimepiride  1 mg Oral QAC breakfast  . insulin aspart  0-15 Units Subcutaneous Q4H  .  ipratropium  1 spray Nasal Daily  . sodium chloride  3 mL Intravenous Q12H  . sodium chloride  3 mL Intravenous Q12H  . vancomycin  1,000 mg Intravenous Q12H   Continuous Infusions: . sodium chloride 50 mL/hr at 05/24/12 0813    Principal Problem:   Cellulitis of scrotum Active Problems:   DIABETES MELLITUS, CONTROLLED   CKD (chronic kidney disease), stage III   BACK PAIN, LUMBAR   Hypertension   S/P ICD (internal cardiac defibrillator) procedure   Warfarin anticoagulation   Atrial fibrillation   Cardiomyopathy   Encounter for long-term (current) use of anticoagulants   Renal insufficiency   Carotid artery disease    20 minutes     [name],[name]  [facility] Pager [phone]. If 7PM-7AM, please contact night-coverage at [email or web address], password [facility] 05/25/2012, 9:48 AM  LOS: 2 days

## 2012-05-25 NOTE — Plan of Care (Signed)
Problem: Phase I Progression Outcomes Goal: Initial discharge plan identified Outcome: Completed/Met Date Met:  05/25/12 Initial plan of DC is to return home with wife Goal: Other Phase I Outcomes/Goals Outcome: Completed/Met Date Met:  05/25/12 Pt noted to have swelling in penis, MD notified, small amt of packing placed in open wound and scrotum elevated, pt continues to receive IV abx, afebrile, will continue to monitor

## 2012-05-25 NOTE — Progress Notes (Signed)
ANTIBIOTIC CONSULT NOTE - FOLLOW UP  Pharmacy Consult for Vancomycin Indication: scrotal cellulitis/ Fournier's gangrene  Allergies  Allergen Reactions  . Avelox (Moxifloxacin Hcl In Nacl) Swelling, Rash and Other (See Comments)    Patient became hypotensive after infusion started  . Penicillins     REACTION: anaphylaxis    Patient Measurements: Height: 6' 0.05" (183 cm) Weight: 251 lb 6.4 oz (114.034 kg) (C scale) IBW/kg (Calculated) : 77.71  Vital Signs: Temp: 98.7 F (37.1 C) (03/07 1326) Temp src: Oral (03/07 1326) BP: 118/70 mmHg (03/07 1326) Pulse Rate: 71 (03/07 1326) Intake/Output from previous day: 03/06 0701 - 03/07 0700 In: 1751.7 [P.O.:960; I.V.:241.7; IV Piggyback:550] Out: 1201 [Urine:1200; Stool:1] Intake/Output from this shift: Total I/O In: 420 [P.O.:420] Out: 300 [Urine:300]  Labs:  Recent Labs  05/23/12 1509 05/23/12 1712 05/25/12 0645  WBC 16.3*  --  11.2*  HGB 15.7  --  13.4  PLT 158  --  214  CREATININE  --  1.29  --    Estimated Creatinine Clearance: 68.5 ml/min (by C-G formula based on Cr of 1.29).  Microbiology:  3/4 blood cultures x 2 - no growth to date  Assessment:  Day # 3 Vancomycin and Clindamycin IV.  S/p I&D, noted draining. WBC decreasing, afebrile. Surgery evaluated 3/6, no surgical intervention yet.     On Coumadin prior to admission for atrial fibrillation.   Coumadin held, reversed with Vitamim K 5 mg IV on 3.5 pm. Currently has SCDs.   Goal of Therapy:  Vancomycin trough level 10-15 mcg/ml  Plan:   Continue Vancomycin 1 gram IV q12hrs.  Bmet in am.  Will also add PT/INR.  Vancomycin trough level prior to 8am dose on 3/8.  Dennie Fetters, RPh Pager: [phone] 05/25/2012,1:50 PM

## 2012-05-26 DIAGNOSIS — I509 Heart failure, unspecified: Secondary | ICD-10-CM

## 2012-05-26 LAB — PROTIME-INR: Prothrombin Time: 14.3 seconds (ref 11.6–15.2)

## 2012-05-26 LAB — GLUCOSE, CAPILLARY
Glucose-Capillary: 110 mg/dL — ABNORMAL HIGH (ref 70–99)
Glucose-Capillary: 113 mg/dL — ABNORMAL HIGH (ref 70–99)
Glucose-Capillary: 100 mg/dL — ABNORMAL HIGH (ref 70–99)

## 2012-05-26 LAB — CBC
MCH: 30.5 pg (ref 26.0–34.0)
Platelets: 173 10*3/uL (ref 150–400)
RBC: 4.59 MIL/uL (ref 4.22–5.81)

## 2012-05-26 LAB — BASIC METABOLIC PANEL
BUN: 12 mg/dL (ref 6–23)
Creatinine, Ser: 1.12 mg/dL (ref 0.50–1.35)
GFR calc Af Amer: 74 mL/min — ABNORMAL LOW (ref >90)
GFR calc non Af Amer: 64 mL/min — ABNORMAL LOW (ref >90)
Potassium: 3.2 mEq/L — ABNORMAL LOW (ref 3.5–5.1)

## 2012-05-26 MED ORDER — WARFARIN - PHARMACIST DOSING INPATIENT
Freq: Every day | Status: DC
  Administered 2012-05-26: 18:00:00

## 2012-05-26 MED ORDER — HYDROMORPHONE HCL PF 1 MG/ML IJ SOLN
1.0000 mg | INTRAMUSCULAR | Status: DC | PRN
  Administered 2012-05-26 – 2012-05-27 (×2): 1 mg via INTRAVENOUS
  Filled 2012-05-26 (×2): qty 1

## 2012-05-26 MED ORDER — OXYCODONE HCL 5 MG PO TABS
10.0000 mg | ORAL_TABLET | ORAL | Status: DC | PRN
  Administered 2012-05-27 – 2012-05-29 (×7): 10 mg via ORAL
  Filled 2012-05-26 (×7): qty 2

## 2012-05-26 MED ORDER — POTASSIUM CHLORIDE CRYS ER 20 MEQ PO TBCR
40.0000 meq | EXTENDED_RELEASE_TABLET | Freq: Two times a day (BID) | ORAL | Status: AC
  Administered 2012-05-26 (×2): 40 meq via ORAL
  Filled 2012-05-26 (×2): qty 2

## 2012-05-26 MED ORDER — FUROSEMIDE 80 MG PO TABS
80.0000 mg | ORAL_TABLET | Freq: Two times a day (BID) | ORAL | Status: DC
  Administered 2012-05-26 – 2012-05-29 (×6): 80 mg via ORAL
  Filled 2012-05-26 (×8): qty 1

## 2012-05-26 MED ORDER — VANCOMYCIN HCL 1000 MG IV SOLR
750.0000 mg | Freq: Two times a day (BID) | INTRAVENOUS | Status: DC
  Administered 2012-05-26 – 2012-05-28 (×4): 750 mg via INTRAVENOUS
  Filled 2012-05-26 (×5): qty 750

## 2012-05-26 MED ORDER — WARFARIN SODIUM 5 MG PO TABS
5.0000 mg | ORAL_TABLET | Freq: Once | ORAL | Status: AC
  Administered 2012-05-26: 5 mg via ORAL
  Filled 2012-05-26: qty 1

## 2012-05-26 MED ORDER — INSULIN ASPART 100 UNIT/ML ~~LOC~~ SOLN: 0.0000 [IU] | Freq: Three times a day (TID) | SUBCUTANEOUS | Status: DC

## 2012-05-26 NOTE — Progress Notes (Signed)
Pharmacy Consult - Coumadin  Patient known to pharmacy from vancomycin dosing. Resuming Coumadin for Afib Received 5 mg of IV vitamin K 3/5  Dose PTA = 2.5 mg po daily except for 5 mg Wednesday  Plan: 1) Coumadin 5 mg po x 1 dose at 1800 pm 2) Daily INR  Thanks Okey Regal, PharmD [phone]

## 2012-05-26 NOTE — Progress Notes (Signed)
[facility] PROGRESS NOTE  [name] ZOX:[id number] DOB: 1940-05-08 DOA: 05/23/2012 PCP: [name], MD  Assessment/Plan: 1. Scrotal cellulitis: s/p I&D , draining. On IV antibiotics. Surgery onboard.   2.  Chronic renal insufficiency: stable.   3. Hypertension: controlled. Resume home medications.   4. Atrial fibrillation: rate controlled. Anticoagulation on hold for possible surgery in the near future.   5. Diabetes Mellitus:  CBG (last 3)   Recent Labs  05/26/12 0419 05/26/12 0828 05/26/12 1159  GLUCAP 104* 113* 100*    Resume homemedications  And SSI.   6. Dvt prophylaxis: scd's  7. Chronic diastolic heart failure: resume lasix.   Code Status: full code Family Communication: none at bedside Disposition Plan: pending   Consultants:  surgery  Procedures:  I&D  Antibiotics:  Vancomycin  clindamycin  HPI/Subjective: Pain is better.   Objective: Filed Vitals:   05/25/12 2150 05/25/12 2249 05/26/12 0624 05/26/12 1444  BP: 127/79 131/84 126/76 121/76  Pulse:  71 75 69  Temp:  98.4 F (36.9 C) 98.3 F (36.8 C) 98.2 F (36.8 C)  TempSrc:  Oral Oral Oral  Resp:  22 20 20   Height:      Weight:   113.7 kg (250 lb 10.6 oz)   SpO2:  96% 96% 95%    Intake/Output Summary (Last 24 hours) at 05/26/12 1536 Last data filed at 05/26/12 1511  Gross per 24 hour  Intake    810 ml  Output   1175 ml  Net   -365 ml   Filed Weights   05/24/12 0455 05/25/12 0518 05/26/12 0624  Weight: 113.2 kg (249 lb 9 oz) 114.034 kg (251 lb 6.4 oz) 113.7 kg (250 lb 10.6 oz)    Exam: Alert afebrile comfortable CHEST: Normal respiration, clear to auscultation bilaterally.  HEART: Regular rate and rhythm. There are no murmur, rub, or gallops.  BACK: No kyphosis or scoliosis; no CVA tenderness  ABDOMEN: soft and non-tender; no masses, no organomegaly, normal abdominal bowel sounds; no pannus; no intertriginous candida. There is no rebound and no  distention.  EXTREMITIES: No bone or joint deformity; age-appropriate arthropathy of the hands and knees; no edema; no ulcerations. There is no calf tenderness     Data Reviewed: Basic Metabolic Panel:  Recent Labs Lab 05/23/12 1712 05/26/12 0710  NA 137 142  K 4.0 3.2*  CL 99 107  CO2 24 23  GLUCOSE 189* 112*  BUN 25* 12  CREATININE 1.29 1.12  CALCIUM 9.2 8.9   Liver Function Tests: No results found for this basename: AST, ALT, ALKPHOS, BILITOT, PROT, ALBUMIN,  in the last 168 hours No results found for this basename: LIPASE, AMYLASE,  in the last 168 hours No results found for this basename: AMMONIA,  in the last 168 hours CBC:  Recent Labs Lab 05/23/12 1509 05/25/12 0645 05/26/12 0710  WBC 16.3* 11.2* 9.1  HGB 15.7 13.4 14.0  HCT 44.4 38.2* 40.4  MCV 88.6 87.8 88.0  PLT 158 214 173   Cardiac Enzymes: No results found for this basename: CKTOTAL, CKMB, CKMBINDEX, TROPONINI,  in the last 168 hours BNP (last 3 results) No results found for this basename: PROBNP,  in the last 8760 hours CBG:  Recent Labs Lab 05/25/12 2020 05/26/12 0011 05/26/12 0419 05/26/12 0828 05/26/12 1159  GLUCAP 100* 110* 104* 113* 100*    Recent Results (from the past 240 hour(s))  CULTURE, BLOOD (ROUTINE X 2)     Status: None   Collection Time  05/23/12  5:15 PM      Result Value Range Status   Specimen Description BLOOD RIGHT ANTECUBITAL   Final   Special Requests BOTTLES DRAWN AEROBIC AND ANAEROBIC 10CC   Final   Culture  Setup Time 05/24/2012 00:59   Final   Culture     Final   Value:        BLOOD CULTURE RECEIVED NO GROWTH TO DATE CULTURE WILL BE HELD FOR 5 DAYS BEFORE ISSUING A FINAL NEGATIVE REPORT   Report Status PENDING   Incomplete  CULTURE, BLOOD (ROUTINE X 2)     Status: None   Collection Time    05/23/12  5:30 PM      Result Value Range Status   Specimen Description BLOOD ARM RIGHT   Final   Special Requests BOTTLES DRAWN AEROBIC AND ANAEROBIC 10CC   Final    Culture  Setup Time 05/24/2012 00:59   Final   Culture     Final   Value:        BLOOD CULTURE RECEIVED NO GROWTH TO DATE CULTURE WILL BE HELD FOR 5 DAYS BEFORE ISSUING A FINAL NEGATIVE REPORT   Report Status PENDING   Incomplete     Studies: US Scrotum  05/25/2012  *RADIOLOGY REPORT*  Clinical Data:  Worsening pain and swelling.  SCROTAL ULTRASOUND DOPPLER ULTRASOUND OF THE TESTICLES  Technique: Complete ultrasound examination of the testicles, epididymis, and other scrotal structures was performed.  Color and spectral Doppler ultrasound were also utilized to evaluate blood flow to the testicles.  Comparison:  CT 05/23/2012.  Findings:  Right testis:  5.1 x 1.8 x 2.7 cm.  Normal gray scale and color Doppler appearance.  Left testis:  2.7 x 1.5 x 2.2 cm.  Normal gray scale and color Doppler appearance.  Right epididymis:  Normal in size and appearance.  Left epididymis:  Normal in size and appearance.  Hydrocele:  Moderate right-sided.  Varicocele:  Absent  Pulsed Doppler interrogation of both testes demonstrates low resistance flow bilaterally.  A suggestion of left hemiscrotal skin thickening and hyperemia, including on image 42.  The technologist labeled this as the epididymis, but the epididymis is felt to be positioned immediately adjacent the testicle, including on image 42.  IMPRESSION:  1.  No evidence of orchitis or testicular torsion. 2.  Moderate right sided hydrocele. 3.  Suggestion of left scrotal skin thickening and hyperemia. Question cellulitis.  Consider physical exam correlation.   Original Report Authenticated By: Jeronimo Greaves, M.D.    [place] Art/ven Flow Abd Pelv Doppler  05/25/2012  *RADIOLOGY REPORT*  Clinical Data:  Worsening pain and swelling.  SCROTAL ULTRASOUND DOPPLER ULTRASOUND OF THE TESTICLES  Technique: Complete ultrasound examination of the testicles, epididymis, and other scrotal structures was performed.  Color and spectral Doppler ultrasound were also utilized to evaluate blood  flow to the testicles.  Comparison:  CT 05/23/2012.  Findings:  Right testis:  5.1 x 1.8 x 2.7 cm.  Normal gray scale and color Doppler appearance.  Left testis:  2.7 x 1.5 x 2.2 cm.  Normal gray scale and color Doppler appearance.  Right epididymis:  Normal in size and appearance.  Left epididymis:  Normal in size and appearance.  Hydrocele:  Moderate right-sided.  Varicocele:  Absent  Pulsed Doppler interrogation of both testes demonstrates low resistance flow bilaterally.  A suggestion of left hemiscrotal skin thickening and hyperemia, including on image 42.  The technologist labeled this as the epididymis, but the epididymis is felt  to be positioned immediately adjacent the testicle, including on image 42.  IMPRESSION:  1.  No evidence of orchitis or testicular torsion. 2.  Moderate right sided hydrocele. 3.  Suggestion of left scrotal skin thickening and hyperemia. Question cellulitis.  Consider physical exam correlation.   Original Report Authenticated By: Jeronimo Greaves, M.D.     Scheduled Meds: . allopurinol  50 mg Oral Daily  . amLODipine  5 mg Oral BID  . aspirin  81 mg Oral Weekly  . atorvastatin  10 mg Oral Daily  . carvedilol  12.5 mg Oral BID WC  . clindamycin (CLEOCIN) IV  900 mg Intravenous Q8H  . docusate sodium  100 mg Oral BID  . furosemide  80 mg Oral BID  . gabapentin  100 mg Oral BID  . glimepiride  1 mg Oral QAC breakfast  . insulin aspart  0-15 Units Subcutaneous TID WC  . ipratropium  1 spray Nasal Daily  . potassium chloride  40 mEq Oral BID  . sodium chloride  3 mL Intravenous Q12H  . vancomycin  750 mg Intravenous Q12H   Continuous Infusions:    Principal Problem:   Cellulitis of scrotum Active Problems:   DIABETES MELLITUS, CONTROLLED   CKD (chronic kidney disease), stage III   BACK PAIN, LUMBAR   Hypertension   S/P ICD (internal cardiac defibrillator) procedure   Warfarin anticoagulation   Atrial fibrillation   Cardiomyopathy   Encounter for long-term  (current) use of anticoagulants   Renal insufficiency   Carotid artery disease    20 minutes     [name],[name]  [facility] Pager [phone]. If 7PM-7AM, please contact night-coverage at [email or web address], password [facility] 05/26/2012, 3:36 PM  LOS: 3 days

## 2012-05-26 NOTE — Progress Notes (Signed)
ANTIBIOTIC CONSULT NOTE - FOLLOW UP  Pharmacy Consult for vancomycin Indication: scrotal cellulitis/ Fournier's gangrene   Allergies  Allergen Reactions  . Avelox (Moxifloxacin Hcl In Nacl) Swelling, Rash and Other (See Comments)    Patient became hypotensive after infusion started  . Penicillins     REACTION: anaphylaxis    Patient Measurements: Height: 6' 0.05" (183 cm) Weight: 250 lb 10.6 oz (113.7 kg) (scale c) IBW/kg (Calculated) : 77.71   Vital Signs: Temp: 98.3 F (36.8 C) (03/08 0624) Temp src: Oral (03/08 0624) BP: 126/76 mmHg (03/08 0624) Pulse Rate: 75 (03/08 0624) Intake/Output from previous day: 03/07 0701 - 03/08 0700 In: 1140 [P.O.:640; IV Piggyback:500] Out: 1725 [Urine:1725] Intake/Output from this shift:    Labs:  Recent Labs  05/23/12 1509 05/23/12 1712 05/25/12 0645 05/26/12 0710  WBC 16.3*  --  11.2* 9.1  HGB 15.7  --  13.4 14.0  PLT 158  --  214 173  CREATININE  --  1.29  --  1.12   Estimated Creatinine Clearance: 78.8 ml/min (by C-G formula based on Cr of 1.12).  Recent Labs  05/26/12 0710  VANCOTROUGH 16.6     Microbiology: Recent Results (from the past 720 hour(s))  CULTURE, BLOOD (ROUTINE X 2)     Status: None   Collection Time    05/23/12  5:15 PM      Result Value Range Status   Specimen Description BLOOD RIGHT ANTECUBITAL   Final   Special Requests BOTTLES DRAWN AEROBIC AND ANAEROBIC 10CC   Final   Culture  Setup Time 05/24/2012 00:59   Final   Culture     Final   Value:        BLOOD CULTURE RECEIVED NO GROWTH TO DATE CULTURE WILL BE HELD FOR 5 DAYS BEFORE ISSUING A FINAL NEGATIVE REPORT   Report Status PENDING   Incomplete  CULTURE, BLOOD (ROUTINE X 2)     Status: None   Collection Time    05/23/12  5:30 PM      Result Value Range Status   Specimen Description BLOOD ARM RIGHT   Final   Special Requests BOTTLES DRAWN AEROBIC AND ANAEROBIC 10CC   Final   Culture  Setup Time 05/24/2012 00:59   Final   Culture      Final   Value:        BLOOD CULTURE RECEIVED NO GROWTH TO DATE CULTURE WILL BE HELD FOR 5 DAYS BEFORE ISSUING A FINAL NEGATIVE REPORT   Report Status PENDING   Incomplete    Anti-infectives   Start     Dose/Rate Route Frequency Ordered Stop   05/24/12 0800  vancomycin (VANCOCIN) IVPB 1000 mg/200 mL premix     1,000 mg 200 mL/hr over 60 Minutes Intravenous Every 12 hours 05/23/12 2356     05/24/12 0600  clindamycin (CLEOCIN) IVPB 900 mg     900 mg 100 mL/hr over 30 Minutes Intravenous 3 times per day 05/23/12 2356     05/23/12 1830  clindamycin (CLEOCIN) IVPB 600 mg     600 mg 100 mL/hr over 30 Minutes Intravenous  Once 05/23/12 1824 05/23/12 2037   05/23/12 1800  vancomycin (VANCOCIN) 2,000 mg in sodium chloride 0.9 % 500 mL IVPB     2,000 mg 250 mL/hr over 120 Minutes Intravenous NOW 05/23/12 1734 05/23/12 2237      Assessment: 72 y.o male admitted with scrotal cellulitis/Fournier's gangrene, w/ foul smelling draining fluid on vancomycin and clindamycin D#4.WBC decreasing 9.1afebrile. Surgery  evaluated 3/6, no surgical intervention yet. VT 3/8 AM 16.6 scr 1.13 est crcl ~1ml/min.  Goal of Therapy:  Vancomycin trough level 10-15 mcg/ml  Plan:  1. Reduce vancomycin 750mg  IV Q12h 2. Trough as needed 3. Monitor for continued clinical progression  Bola A. Wandra Feinstein D Clinical Pharmacist Pager:[phone] Phone [phone] 05/26/2012 8:28 AM

## 2012-05-26 NOTE — Plan of Care (Signed)
Problem: Phase II Progression Outcomes Goal: Vital signs remain stable Outcome: Completed/Met Date Met:  05/26/12 Vital signs WNL Goal: IV changed to normal saline lock Outcome: Completed/Met Date Met:  05/26/12 Pt no longer on fluids, pt receiving IV abx, goal met

## 2012-05-26 NOTE — Progress Notes (Signed)
Patient ID: [name], male   DOB: 07-10-1940, 72 y.o.   MRN: [id number]    Subjective: Gradually less pain and tenderness  Objective: Vital signs in last 24 hours: Temp:  [98.3 F (36.8 C)-98.7 F (37.1 C)] 98.3 F (36.8 C) (03/08 0624) Pulse Rate:  [71-75] 75 (03/08 0624) Resp:  [20-22] 20 (03/08 0624) BP: (118-131)/(70-84) 126/76 mmHg (03/08 0624) SpO2:  [96 %] 96 % (03/08 0624) Weight:  [250 lb 10.6 oz (113.7 kg)] 250 lb 10.6 oz (113.7 kg) (03/08 0624) Last BM Date: 05/26/12  Intake/Output from previous day: 03/07 0701 - 03/08 0700 In: 1140 [P.O.:640; IV Piggyback:500] Out: 1725 [Urine:1725] Intake/Output this shift: Total I/O In: 120 [P.O.:120] Out: -   General appearance: alert, cooperative and no distress Incision/Wound: Wound now widely open after blunt bedside debridement.  Repacked with gauze.  Less surrounding erythema and induration. Superficial necrotic tissue in wound  Lab Results:   Recent Labs  05/25/12 0645 05/26/12 0710  WBC 11.2* 9.1  HGB 13.4 14.0  HCT 38.2* 40.4  PLT 214 173   BMET  Recent Labs  05/23/12 1712 05/26/12 0710  NA 137 142  K 4.0 3.2*  CL 99 107  CO2 24 23  GLUCOSE 189* 112*  BUN 25* 12  CREATININE 1.29 1.12  CALCIUM 9.2 8.9     Studies/Results: US Scrotum  05/25/2012  *RADIOLOGY REPORT*  Clinical Data:  Worsening pain and swelling.  SCROTAL ULTRASOUND DOPPLER ULTRASOUND OF THE TESTICLES  Technique: Complete ultrasound examination of the testicles, epididymis, and other scrotal structures was performed.  Color and spectral Doppler ultrasound were also utilized to evaluate blood flow to the testicles.  Comparison:  CT 05/23/2012.  Findings:  Right testis:  5.1 x 1.8 x 2.7 cm.  Normal gray scale and color Doppler appearance.  Left testis:  2.7 x 1.5 x 2.2 cm.  Normal gray scale and color Doppler appearance.  Right epididymis:  Normal in size and appearance.  Left epididymis:  Normal in size and appearance.   Hydrocele:  Moderate right-sided.  Varicocele:  Absent  Pulsed Doppler interrogation of both testes demonstrates low resistance flow bilaterally.  A suggestion of left hemiscrotal skin thickening and hyperemia, including on image 42.  The technologist labeled this as the epididymis, but the epididymis is felt to be positioned immediately adjacent the testicle, including on image 42.  IMPRESSION:  1.  No evidence of orchitis or testicular torsion. 2.  Moderate right sided hydrocele. 3.  Suggestion of left scrotal skin thickening and hyperemia. Question cellulitis.  Consider physical exam correlation.   Original Report Authenticated By: Jeronimo Greaves, M.D.    [place] Art/ven Flow Abd Pelv Doppler  05/25/2012  *RADIOLOGY REPORT*  Clinical Data:  Worsening pain and swelling.  SCROTAL ULTRASOUND DOPPLER ULTRASOUND OF THE TESTICLES  Technique: Complete ultrasound examination of the testicles, epididymis, and other scrotal structures was performed.  Color and spectral Doppler ultrasound were also utilized to evaluate blood flow to the testicles.  Comparison:  CT 05/23/2012.  Findings:  Right testis:  5.1 x 1.8 x 2.7 cm.  Normal gray scale and color Doppler appearance.  Left testis:  2.7 x 1.5 x 2.2 cm.  Normal gray scale and color Doppler appearance.  Right epididymis:  Normal in size and appearance.  Left epididymis:  Normal in size and appearance.  Hydrocele:  Moderate right-sided.  Varicocele:  Absent  Pulsed Doppler interrogation of both testes demonstrates low resistance flow bilaterally.  A suggestion of left hemiscrotal  skin thickening and hyperemia, including on image 42.  The technologist labeled this as the epididymis, but the epididymis is felt to be positioned immediately adjacent the testicle, including on image 42.  IMPRESSION:  1.  No evidence of orchitis or testicular torsion. 2.  Moderate right sided hydrocele. 3.  Suggestion of left scrotal skin thickening and hyperemia. Question cellulitis.  Consider physical  exam correlation.   Original Report Authenticated By: Jeronimo Greaves, M.D.     Anti-infectives: Anti-infectives   Start     Dose/Rate Route Frequency Ordered Stop   05/26/12 2000  vancomycin (VANCOCIN) 750 mg in sodium chloride 0.9 % 150 mL IVPB     750 mg 150 mL/hr over 60 Minutes Intravenous Every 12 hours 05/26/12 0831     05/24/12 0800  vancomycin (VANCOCIN) IVPB 1000 mg/200 mL premix  Status:  Discontinued     1,000 mg 200 mL/hr over 60 Minutes Intravenous Every 12 hours 05/23/12 2356 05/26/12 0831   05/24/12 0600  clindamycin (CLEOCIN) IVPB 900 mg     900 mg 100 mL/hr over 30 Minutes Intravenous 3 times per day 05/23/12 2356     05/23/12 1830  clindamycin (CLEOCIN) IVPB 600 mg     600 mg 100 mL/hr over 30 Minutes Intravenous  Once 05/23/12 1824 05/23/12 2037   05/23/12 1800  vancomycin (VANCOCIN) 2,000 mg in sodium chloride 0.9 % 500 mL IVPB     2,000 mg 250 mL/hr over 120 Minutes Intravenous NOW 05/23/12 1734 05/23/12 2237      Assessment/Plan: Infected sebaceous cyst of scrotum, improving with current wound care and abx. I do no think he will need any surgical intervention. Continue current care    LOS: 3 days    Brooks Kinnan T 05/26/2012

## 2012-05-27 DIAGNOSIS — I10 Essential (primary) hypertension: Secondary | ICD-10-CM

## 2012-05-27 LAB — BASIC METABOLIC PANEL
CO2: 23 mEq/L (ref 19–32)
Calcium: 8.4 mg/dL (ref 8.4–10.5)
Creatinine, Ser: 1.17 mg/dL (ref 0.50–1.35)

## 2012-05-27 LAB — CBC
HCT: 38.3 % — ABNORMAL LOW (ref 39.0–52.0)
MCV: 88.7 fL (ref 78.0–100.0)
RDW: 13.8 % (ref 11.5–15.5)
WBC: 10.6 10*3/uL — ABNORMAL HIGH (ref 4.0–10.5)

## 2012-05-27 LAB — GLUCOSE, CAPILLARY: Glucose-Capillary: 107 mg/dL — ABNORMAL HIGH (ref 70–99)

## 2012-05-27 MED ORDER — WARFARIN SODIUM 5 MG PO TABS
5.0000 mg | ORAL_TABLET | Freq: Once | ORAL | Status: AC
  Administered 2012-05-27: 5 mg via ORAL
  Filled 2012-05-27: qty 1

## 2012-05-27 NOTE — Progress Notes (Signed)
Report given to [place], RN on 5N.  Pt. Informed about being transferred to new room, however he wants to wait to call his wife to let her know.  Pt. To be transferred with belongings.

## 2012-05-27 NOTE — Progress Notes (Signed)
Pharmacy Consult - Coumadin  Patient known to pharmacy from vancomycin dosing. Resuming Coumadin for Afib Received 5 mg of IV vitamin K 3/5  Dose PTA = 2.5 mg po daily except for 5 mg Wednesday  Plan: 1) Repeat Coumadin 5 mg po x 1 dose at 1800 pm 2) Daily INR  Thanks Okey Regal, PharmD [phone]

## 2012-05-27 NOTE — Evaluation (Signed)
Physical Therapy Evaluation Patient Details Name: Salik Grewell MRN: [id number] DOB: [birth year] Today's Date: 05/27/2012 Time: 9562-1308 PT Time Calculation (min): 39 min  PT Assessment / Plan / Recommendation Clinical Impression  pt presents with Scrotal Cellulitis s/p I+D.  pt very painful with mobility, but very motivated to improve mobility.  Would benefit from HHPT for safety at home.      PT Assessment  Patient needs continued PT services    Follow Up Recommendations  Home health PT;Supervision - Intermittent    Does the patient have the potential to tolerate intense rehabilitation      Barriers to Discharge None      Equipment Recommendations  None recommended by PT    Recommendations for Other Services     Frequency Min 3X/week    Precautions / Restrictions Precautions Precautions: Fall Restrictions Weight Bearing Restrictions: No   Pertinent Vitals/Pain Did not rate, but indicates painful despite being premedicated.        Mobility  Bed Mobility Bed Mobility: Supine to Sit;Sitting - Scoot to [place] of Bed;Sit to Supine Supine to Sit: 4: Min assist;With rails Sitting - Scoot to [place] of Bed: 5: Supervision Sit to Supine: 5: Supervision Details for Bed Mobility Assistance: cues to try to log roll to minimize scrotal contact with bed linens with supine to sit.  pt requires use of rail to minimize A required from PT.   Transfers Transfers: Sit to Stand;Stand to Sit Sit to Stand: 4: Min guard;With upper extremity assist;From bed;From chair/3-in-1 Stand to Sit: 4: Min guard;With upper extremity assist;To chair/3-in-1;To bed Details for Transfer Assistance: cues for UE use, increased BOS to allow more room for scrotum.   Ambulation/Gait Ambulation/Gait Assistance: 4: Min guard Ambulation Distance (Feet): 120 Feet (x2) Assistive device: Rolling walker Ambulation/Gait Assistance Details: pt ambulates with wide BOS for scrotal comfort.  cues for upright  posture.  One seated rest break in between bouts of gait.   Gait Pattern: Step-through pattern;Decreased stride length;Wide base of support;Trunk flexed Stairs: No Wheelchair Mobility Wheelchair Mobility: No    Exercises     PT Diagnosis: Difficulty walking;Acute pain  PT Problem List: Decreased activity tolerance;Decreased balance;Decreased mobility;Decreased coordination;Decreased knowledge of use of DME;Pain PT Treatment Interventions: DME instruction;Gait training;Stair training;Functional mobility training;Therapeutic activities;Therapeutic exercise;Balance training;Patient/family education   PT Goals Acute Rehab PT Goals PT Goal Formulation: With patient Time For Goal Achievement: 06/03/12 Potential to Achieve Goals: Good Pt will go Supine/Side to Sit: with modified independence PT Goal: Supine/Side to Sit - Progress: Goal set today Pt will go Sit to Supine/Side: with modified independence PT Goal: Sit to Supine/Side - Progress: Goal set today Pt will go Sit to Stand: with modified independence PT Goal: Sit to Stand - Progress: Goal set today Pt will go Stand to Sit: with modified independence PT Goal: Stand to Sit - Progress: Goal set today Pt will Ambulate: >150 feet;with modified independence;with rolling walker PT Goal: Ambulate - Progress: Goal set today Pt will Go Up / Down Stairs: 1-2 stairs;with min assist;with least restrictive assistive device PT Goal: Up/Down Stairs - Progress: Goal set today  Visit Information  Last PT Received On: 05/27/12 Assistance Needed: +1    Subjective Data  Subjective: It makes it hard to do anything with it hurting where it is.   Patient Stated Goal: Home with wife and dog   Prior Functioning  Home Living Lives With: Spouse Available Help at Discharge: Family;Available 24 hours/day Type of Home: House Home Access: Stairs to  enter Entrance Stairs-Number of Steps: 2 Entrance Stairs-Rails: Right Home Layout: One level Home  Adaptive Equipment: Straight cane;Walker - rolling Prior Function Level of Independence: Independent with assistive device(s) Able to Take Stairs?: Yes Driving: No (Not for last year) Vocation: Retired Musician: Surveyor, mining Overall Cognitive Status: Appears within functional limits for tasks assessed/performed Arousal/Alertness: Awake/alert Orientation Level: Appears intact for tasks assessed Behavior During Session: [facility] for tasks performed    Extremity/Trunk Assessment Right Lower Extremity Assessment RLE ROM/Strength/Tone: [facility] for tasks assessed RLE Sensation: History of peripheral neuropathy Left Lower Extremity Assessment LLE ROM/Strength/Tone: WFL for tasks assessed LLE Sensation: History of peripheral neuropathy Trunk Assessment Trunk Assessment: Normal   Balance Balance Balance Assessed: No  End of Session PT - End of Session Equipment Utilized During Treatment: Gait belt Activity Tolerance: Patient tolerated treatment well Patient left: in bed;with call bell/phone within reach;with nursing in room Nurse Communication: Mobility status  GP     RitenourAlison Murray, [place] [phone] 05/27/2012, 10:37 AM

## 2012-05-27 NOTE — Progress Notes (Signed)
[facility] PROGRESS NOTE  [name] JYN:[id number] DOB: [birth year] DOA: 05/23/2012 PCP: [name], MD  Assessment/Plan: 1. Scrotal cellulitis: s/p I&D , draining. On IV antibiotics. Surgery onboard.   2.  Chronic renal insufficiency: stable.   3. Hypertension: controlled. Resume home medications.   4. Atrial fibrillation: rate controlled. Resume coumadin.    5. Diabetes Mellitus:  CBG (last 3)   Recent Labs  05/27/12 0616 05/27/12 1124 05/27/12 1625  GLUCAP 92 107* 107*    Resume homemedications  And SSI.   6. Dvt prophylaxis: scd's  7. Chronic diastolic heart failure: resume lasix.   Code Status: full code Family Communication: none at bedside Disposition Plan: pending   Consultants:  surgery  Procedures:  I&D  Antibiotics:  Vancomycin  clindamycin  HPI/Subjective: Pain is better.   Objective: Filed Vitals:   05/26/12 1444 05/26/12 2130 05/27/12 0537 05/27/12 1413  BP: 121/76 126/83 126/76 124/90  Pulse: 69 86 76 72  Temp: 98.2 F (36.8 C) 97.3 F (36.3 C) 98.6 F (37 C) 97.5 F (36.4 C)  TempSrc: Oral Oral  Oral  Resp: 20 20 18 20   Height:      Weight:      SpO2: 95% 97% 96% 99%    Intake/Output Summary (Last 24 hours) at 05/27/12 1917 Last data filed at 05/27/12 1700  Gross per 24 hour  Intake   1163 ml  Output    850 ml  Net    313 ml   Filed Weights   05/24/12 0455 05/25/12 0518 05/26/12 0624  Weight: 113.2 kg (249 lb 9 oz) 114.034 kg (251 lb 6.4 oz) 113.7 kg (250 lb 10.6 oz)    Exam: Alert afebrile comfortable CHEST: Normal respiration, clear to auscultation bilaterally.  HEART: Regular rate and rhythm. There are no murmur, rub, or gallops.  BACK: No kyphosis or scoliosis; no CVA tenderness  ABDOMEN: soft and non-tender; no masses, no organomegaly, normal abdominal bowel sounds; no pannus; no intertriginous candida. There is no rebound and no distention.  EXTREMITIES: No bone or joint deformity;  age-appropriate arthropathy of the hands and knees; no edema; no ulcerations. There is no calf tenderness     Data Reviewed: Basic Metabolic Panel:  Recent Labs Lab 05/23/12 1712 05/26/12 0710 05/27/12 1355  NA 137 142 140  K 4.0 3.2* 3.8  CL 99 107 106  CO2 24 23 23   GLUCOSE 189* 112* 109*  BUN 25* 12 13  CREATININE 1.29 1.12 1.17  CALCIUM 9.2 8.9 8.4   Liver Function Tests: No results found for this basename: AST, ALT, ALKPHOS, BILITOT, PROT, ALBUMIN,  in the last 168 hours No results found for this basename: LIPASE, AMYLASE,  in the last 168 hours No results found for this basename: AMMONIA,  in the last 168 hours CBC:  Recent Labs Lab 05/23/12 1509 05/25/12 0645 05/26/12 0710 05/27/12 1015  WBC 16.3* 11.2* 9.1 10.6*  HGB 15.7 13.4 14.0 13.3  HCT 44.4 38.2* 40.4 38.3*  MCV 88.6 87.8 88.0 88.7  PLT 158 214 173 172   Cardiac Enzymes: No results found for this basename: CKTOTAL, CKMB, CKMBINDEX, TROPONINI,  in the last 168 hours BNP (last 3 results) No results found for this basename: PROBNP,  in the last 8760 hours CBG:  Recent Labs Lab 05/26/12 1635 05/26/12 1951 05/27/12 0616 05/27/12 1124 05/27/12 1625  GLUCAP 104* 137* 92 107* 107*    Recent Results (from the past 240 hour(s))  CULTURE, BLOOD (ROUTINE X 2)  Status: None   Collection Time    05/23/12  5:15 PM      Result Value Range Status   Specimen Description BLOOD RIGHT ANTECUBITAL   Final   Special Requests BOTTLES DRAWN AEROBIC AND ANAEROBIC 10CC   Final   Culture  Setup Time 05/24/2012 00:59   Final   Culture     Final   Value:        BLOOD CULTURE RECEIVED NO GROWTH TO DATE CULTURE WILL BE HELD FOR 5 DAYS BEFORE ISSUING A FINAL NEGATIVE REPORT   Report Status PENDING   Incomplete  CULTURE, BLOOD (ROUTINE X 2)     Status: None   Collection Time    05/23/12  5:30 PM      Result Value Range Status   Specimen Description BLOOD ARM RIGHT   Final   Special Requests BOTTLES DRAWN AEROBIC  AND ANAEROBIC 10CC   Final   Culture  Setup Time 05/24/2012 00:59   Final   Culture     Final   Value:        BLOOD CULTURE RECEIVED NO GROWTH TO DATE CULTURE WILL BE HELD FOR 5 DAYS BEFORE ISSUING A FINAL NEGATIVE REPORT   Report Status PENDING   Incomplete     Studies: No results found.  Scheduled Meds: . allopurinol  50 mg Oral Daily  . amLODipine  5 mg Oral BID  . aspirin  81 mg Oral Weekly  . atorvastatin  10 mg Oral Daily  . carvedilol  12.5 mg Oral BID WC  . clindamycin (CLEOCIN) IV  900 mg Intravenous Q8H  . docusate sodium  100 mg Oral BID  . furosemide  80 mg Oral BID  . gabapentin  100 mg Oral BID  . glimepiride  1 mg Oral QAC breakfast  . insulin aspart  0-15 Units Subcutaneous TID WC  . ipratropium  1 spray Nasal Daily  . sodium chloride  3 mL Intravenous Q12H  . vancomycin  750 mg Intravenous Q12H  . Warfarin - Pharmacist Dosing Inpatient   Does not apply q1800   Continuous Infusions:    Principal Problem:   Cellulitis of scrotum Active Problems:   DIABETES MELLITUS, CONTROLLED   CKD (chronic kidney disease), stage III   BACK PAIN, LUMBAR   Hypertension   S/P ICD (internal cardiac defibrillator) procedure   Warfarin anticoagulation   Atrial fibrillation   Cardiomyopathy   Encounter for long-term (current) use of anticoagulants   Renal insufficiency   Carotid artery disease    20 minutes     [name],[name]  [facility] Pager [phone]. If 7PM-7AM, please contact night-coverage at [email or web address], password [facility] 05/27/2012, 7:17 PM  LOS: 4 days

## 2012-05-27 NOTE — Progress Notes (Signed)
Subjective: Still with moderate pain with dressing changes  Objective: Vital signs in last 24 hours: Temp:  [97.3 F (36.3 C)-98.6 F (37 C)] 98.6 F (37 C) (03/09 0537) Pulse Rate:  [69-86] 76 (03/09 0537) Resp:  [18-20] 18 (03/09 0537) BP: (121-126)/(76-83) 126/76 mmHg (03/09 0537) SpO2:  [95 %-97 %] 96 % (03/09 0537) Last BM Date: 05/26/12  Intake/Output from previous day: 03/08 0701 - 03/09 0700 In: 1023 [P.O.:820; I.V.:3; IV Piggyback:200] Out: 600 [Urine:600] Intake/Output this shift:    Wound clean with no erythema and minimal induration  Lab Results:   Recent Labs  05/25/12 0645 05/26/12 0710  WBC 11.2* 9.1  HGB 13.4 14.0  HCT 38.2* 40.4  PLT 214 173   BMET  Recent Labs  05/26/12 0710  NA 142  K 3.2*  CL 107  CO2 23  GLUCOSE 112*  BUN 12  CREATININE 1.12  CALCIUM 8.9   PT/INR  Recent Labs  05/26/12 0505  LABPROT 14.3  INR 1.13   ABG No results found for this basename: PHART, PCO2, PO2, HCO3,  in the last 72 hours  Studies/Results: US Scrotum  05/25/2012  *RADIOLOGY REPORT*  Clinical Data:  Worsening pain and swelling.  SCROTAL ULTRASOUND DOPPLER ULTRASOUND OF THE TESTICLES  Technique: Complete ultrasound examination of the testicles, epididymis, and other scrotal structures was performed.  Color and spectral Doppler ultrasound were also utilized to evaluate blood flow to the testicles.  Comparison:  CT 05/23/2012.  Findings:  Right testis:  5.1 x 1.8 x 2.7 cm.  Normal gray scale and color Doppler appearance.  Left testis:  2.7 x 1.5 x 2.2 cm.  Normal gray scale and color Doppler appearance.  Right epididymis:  Normal in size and appearance.  Left epididymis:  Normal in size and appearance.  Hydrocele:  Moderate right-sided.  Varicocele:  Absent  Pulsed Doppler interrogation of both testes demonstrates low resistance flow bilaterally.  A suggestion of left hemiscrotal skin thickening and hyperemia, including on image 42.  The technologist  labeled this as the epididymis, but the epididymis is felt to be positioned immediately adjacent the testicle, including on image 42.  IMPRESSION:  1.  No evidence of orchitis or testicular torsion. 2.  Moderate right sided hydrocele. 3.  Suggestion of left scrotal skin thickening and hyperemia. Question cellulitis.  Consider physical exam correlation.   Original Report Authenticated By: Jeronimo Greaves, M.D.    [place] Art/ven Flow Abd Pelv Doppler  05/25/2012  *RADIOLOGY REPORT*  Clinical Data:  Worsening pain and swelling.  SCROTAL ULTRASOUND DOPPLER ULTRASOUND OF THE TESTICLES  Technique: Complete ultrasound examination of the testicles, epididymis, and other scrotal structures was performed.  Color and spectral Doppler ultrasound were also utilized to evaluate blood flow to the testicles.  Comparison:  CT 05/23/2012.  Findings:  Right testis:  5.1 x 1.8 x 2.7 cm.  Normal gray scale and color Doppler appearance.  Left testis:  2.7 x 1.5 x 2.2 cm.  Normal gray scale and color Doppler appearance.  Right epididymis:  Normal in size and appearance.  Left epididymis:  Normal in size and appearance.  Hydrocele:  Moderate right-sided.  Varicocele:  Absent  Pulsed Doppler interrogation of both testes demonstrates low resistance flow bilaterally.  A suggestion of left hemiscrotal skin thickening and hyperemia, including on image 42.  The technologist labeled this as the epididymis, but the epididymis is felt to be positioned immediately adjacent the testicle, including on image 42.  IMPRESSION:  1.  No evidence  of orchitis or testicular torsion. 2.  Moderate right sided hydrocele. 3.  Suggestion of left scrotal skin thickening and hyperemia. Question cellulitis.  Consider physical exam correlation.   Original Report Authenticated By: Jeronimo Greaves, M.D.     Anti-infectives: Anti-infectives   Start     Dose/Rate Route Frequency Ordered Stop   05/26/12 2000  vancomycin (VANCOCIN) 750 mg in sodium chloride 0.9 % 150 mL IVPB      750 mg 150 mL/hr over 60 Minutes Intravenous Every 12 hours 05/26/12 0831     05/24/12 0800  vancomycin (VANCOCIN) IVPB 1000 mg/200 mL premix  Status:  Discontinued     1,000 mg 200 mL/hr over 60 Minutes Intravenous Every 12 hours 05/23/12 2356 05/26/12 0831   05/24/12 0600  clindamycin (CLEOCIN) IVPB 900 mg     900 mg 100 mL/hr over 30 Minutes Intravenous 3 times per day 05/23/12 2356     05/23/12 1830  clindamycin (CLEOCIN) IVPB 600 mg     600 mg 100 mL/hr over 30 Minutes Intravenous  Once 05/23/12 1824 05/23/12 2037   05/23/12 1800  vancomycin (VANCOCIN) 2,000 mg in sodium chloride 0.9 % 500 mL IVPB     2,000 mg 250 mL/hr over 120 Minutes Intravenous NOW 05/23/12 1734 05/23/12 2237      Assessment/Plan: s/p * No surgery found *  Scrotal wound/infected cyst  Continuing to improve with local wound care.  No need for surgical debridement/I and D  LOS: 4 days    [name] A 05/27/2012

## 2012-05-28 DIAGNOSIS — M109 Gout, unspecified: Secondary | ICD-10-CM

## 2012-05-28 LAB — PROTIME-INR: Prothrombin Time: 16.4 seconds — ABNORMAL HIGH (ref 11.6–15.2)

## 2012-05-28 LAB — CBC
HCT: 39.4 % (ref 39.0–52.0)
Hemoglobin: 13.6 g/dL (ref 13.0–17.0)
MCH: 29.7 pg (ref 26.0–34.0)
MCHC: 34.5 g/dL (ref 30.0–36.0)

## 2012-05-28 LAB — GLUCOSE, CAPILLARY
Glucose-Capillary: 104 mg/dL — ABNORMAL HIGH (ref 70–99)
Glucose-Capillary: 84 mg/dL (ref 70–99)

## 2012-05-28 MED ORDER — COLCHICINE 0.6 MG PO TABS
0.6000 mg | ORAL_TABLET | Freq: Two times a day (BID) | ORAL | Status: DC
  Administered 2012-05-28 – 2012-05-29 (×2): 0.6 mg via ORAL
  Filled 2012-05-28 (×3): qty 1

## 2012-05-28 MED ORDER — CLINDAMYCIN HCL 300 MG PO CAPS: 300.0000 mg | ORAL_CAPSULE | Freq: Three times a day (TID) | ORAL | Status: DC

## 2012-05-28 MED ORDER — POTASSIUM CHLORIDE CRYS ER 20 MEQ PO TBCR: 20.0000 meq | EXTENDED_RELEASE_TABLET | Freq: Two times a day (BID) | ORAL | Status: DC

## 2012-05-28 MED ORDER — FUROSEMIDE 80 MG PO TABS: 80.0000 mg | ORAL_TABLET | Freq: Two times a day (BID) | ORAL | Status: DC

## 2012-05-28 MED ORDER — WARFARIN SODIUM 5 MG PO TABS
5.0000 mg | ORAL_TABLET | Freq: Once | ORAL | Status: AC
  Administered 2012-05-28: 5 mg via ORAL
  Filled 2012-05-28: qty 1

## 2012-05-28 MED ORDER — OXYCODONE HCL 10 MG PO TABS: 10.0000 mg | ORAL_TABLET | ORAL | Status: DC | PRN

## 2012-05-28 NOTE — Progress Notes (Signed)
[facility] PROGRESS NOTE  [name] ZOX:[id number] DOB: 10-May-1940 DOA: 05/23/2012 PCP: [name] Melnick, MD  Assessment/Plan: 1. Scrotal cellulitis: s/p I&D , draining. On IV antibiotics. Surgery onboard.   2.  Chronic renal insufficiency: stable.   3. Hypertension: controlled. Resume home medications.   4. Atrial fibrillation: rate controlled. Resume coumadin.    5. Diabetes Mellitus:  CBG (last 3)   Recent Labs  05/28/12 0719 05/28/12 1129 05/28/12 1613  GLUCAP 82 104* 84    Resume homemedications  And SSI.   6. Dvt prophylaxis: scd's  7. Chronic diastolic heart failure: resume lasix.   8. rigth ankle swelling and redness : venous dopplers to rule out DVT and colchicine for possible gout.   Code Status: full code Family Communication: none at bedside Disposition Plan: pending   Consultants:  surgery  Procedures:  I&D  Antibiotics:  Vancomycin  clindamycin  HPI/Subjective: Pain is better.   Objective: Filed Vitals:   05/27/12 0537 05/27/12 1413 05/27/12 2243 05/28/12 0641  BP: 126/76 124/90 135/83 115/69  Pulse: 76 72 75 72  Temp: 98.6 F (37 C) 97.5 F (36.4 C) 98.9 F (37.2 C) 98.6 F (37 C)  TempSrc:  Oral    Resp: 18 20 18 18   Height:      Weight:      SpO2: 96% 99% 99% 98%    Intake/Output Summary (Last 24 hours) at 05/28/12 1926 Last data filed at 05/28/12 8119  Gross per 24 hour  Intake    480 ml  Output   1900 ml  Net  -1420 ml   Filed Weights   05/24/12 0455 05/25/12 0518 05/26/12 0624  Weight: 113.2 kg (249 lb 9 oz) 114.034 kg (251 lb 6.4 oz) 113.7 kg (250 lb 10.6 oz)    Exam: Alert afebrile comfortable CHEST: Normal respiration, clear to auscultation bilaterally.  HEART: Regular rate and rhythm. There are no murmur, rub, or gallops.  BACK: No kyphosis or scoliosis; no CVA tenderness  ABDOMEN: soft and non-tender; no masses, no organomegaly, normal abdominal bowel sounds; no pannus; no intertriginous  candida. There is no rebound and no distention.  EXTREMITIES: No bone or joint deformity; age-appropriate arthropathy of the hands and knees; no edema; no ulcerations. There is no calf tenderness     Data Reviewed: Basic Metabolic Panel:  Recent Labs Lab 05/23/12 1712 05/26/12 0710 05/27/12 1355  NA 137 142 140  K 4.0 3.2* 3.8  CL 99 107 106  CO2 24 23 23   GLUCOSE 189* 112* 109*  BUN 25* 12 13  CREATININE 1.29 1.12 1.17  CALCIUM 9.2 8.9 8.4   Liver Function Tests: No results found for this basename: AST, ALT, ALKPHOS, BILITOT, PROT, ALBUMIN,  in the last 168 hours No results found for this basename: LIPASE, AMYLASE,  in the last 168 hours No results found for this basename: AMMONIA,  in the last 168 hours CBC:  Recent Labs Lab 05/23/12 1509 05/25/12 0645 05/26/12 0710 05/27/12 1015 05/28/12 0750  WBC 16.3* 11.2* 9.1 10.6* 9.5  HGB 15.7 13.4 14.0 13.3 13.6  HCT 44.4 38.2* 40.4 38.3* 39.4  MCV 88.6 87.8 88.0 88.7 86.0  PLT 158 214 173 172 202   Cardiac Enzymes: No results found for this basename: CKTOTAL, CKMB, CKMBINDEX, TROPONINI,  in the last 168 hours BNP (last 3 results) No results found for this basename: PROBNP,  in the last 8760 hours CBG:  Recent Labs Lab 05/27/12 1625 05/27/12 2245 05/28/12 0719 05/28/12 1129 05/28/12  1613  GLUCAP 107* 90 82 104* 84    Recent Results (from the past 240 hour(s))  CULTURE, BLOOD (ROUTINE X 2)     Status: None   Collection Time    05/23/12  5:15 PM      Result Value Range Status   Specimen Description BLOOD RIGHT ANTECUBITAL   Final   Special Requests BOTTLES DRAWN AEROBIC AND ANAEROBIC 10CC   Final   Culture  Setup Time 05/24/2012 00:59   Final   Culture     Final   Value:        BLOOD CULTURE RECEIVED NO GROWTH TO DATE CULTURE WILL BE HELD FOR 5 DAYS BEFORE ISSUING A FINAL NEGATIVE REPORT   Report Status PENDING   Incomplete  CULTURE, BLOOD (ROUTINE X 2)     Status: None   Collection Time    05/23/12  5:30  PM      Result Value Range Status   Specimen Description BLOOD ARM RIGHT   Final   Special Requests BOTTLES DRAWN AEROBIC AND ANAEROBIC 10CC   Final   Culture  Setup Time 05/24/2012 00:59   Final   Culture     Final   Value:        BLOOD CULTURE RECEIVED NO GROWTH TO DATE CULTURE WILL BE HELD FOR 5 DAYS BEFORE ISSUING A FINAL NEGATIVE REPORT   Report Status PENDING   Incomplete     Studies: No results found.  Scheduled Meds: . allopurinol  50 mg Oral Daily  . amLODipine  5 mg Oral BID  . aspirin  81 mg Oral Weekly  . atorvastatin  10 mg Oral Daily  . carvedilol  12.5 mg Oral BID WC  . clindamycin (CLEOCIN) IV  900 mg Intravenous Q8H  . colchicine  0.6 mg Oral BID  . docusate sodium  100 mg Oral BID  . furosemide  80 mg Oral BID  . gabapentin  100 mg Oral BID  . glimepiride  1 mg Oral QAC breakfast  . insulin aspart  0-15 Units Subcutaneous TID WC  . ipratropium  1 spray Nasal Daily  . sodium chloride  3 mL Intravenous Q12H  . Warfarin - Pharmacist Dosing Inpatient   Does not apply q1800   Continuous Infusions:    Principal Problem:   Cellulitis of scrotum Active Problems:   DIABETES MELLITUS, CONTROLLED   CKD (chronic kidney disease), stage III   BACK PAIN, LUMBAR   Hypertension   S/P ICD (internal cardiac defibrillator) procedure   Warfarin anticoagulation   Atrial fibrillation   Cardiomyopathy   Encounter for long-term (current) use of anticoagulants   Renal insufficiency   Carotid artery disease    20 minutes     [name]  [facility] Pager [phone]. If 7PM-7AM, please contact night-coverage at [email or web address], password [facility] 05/28/2012, 7:26 PM  LOS: 5 days

## 2012-05-28 NOTE — Progress Notes (Signed)
Physical Therapy Treatment Patient Details Name: [name] MRN: [id number] DOB: 10/16/1940 Today's Date: 05/28/2012 Time: 7829-5621 PT Time Calculation (min): 13 min  PT Assessment / Plan / Recommendation Comments on Treatment Session  Patient agreeable to ambulation but did not want to practice the two steps. States he feels he will not have a problem getting into his house    Follow Up Recommendations  Home health PT;Supervision - Intermittent     Does the patient have the potential to tolerate intense rehabilitation     Barriers to Discharge        Equipment Recommendations  None recommended by PT    Recommendations for Other Services    Frequency Min 3X/week   Plan Discharge plan remains appropriate;Frequency remains appropriate    Precautions / Restrictions Precautions Precautions: Fall   Pertinent Vitals/Pain 9/10 foot pain. Patient ambulated well.    Mobility  Bed Mobility Supine to Sit: 5: Supervision Sitting - Scoot to Edge of Bed: 5: Supervision Sit to Supine: 5: Supervision Transfers Sit to Stand: 4: Min guard;With upper extremity assist;From bed Stand to Sit: To bed;With upper extremity assist;4: Min guard Details for Transfer Assistance: Cues for safe hand placement Ambulation/Gait Ambulation/Gait Assistance: 4: Min guard Ambulation Distance (Feet): 250 Feet Assistive device: Rolling walker Ambulation/Gait Assistance Details: AMbulate with wide BOS. Cues for RW management Gait Pattern: Step-through pattern;Decreased stride length;Wide base of support;Trunk flexed    Exercises     PT Diagnosis:    PT Problem List:   PT Treatment Interventions:     PT Goals Acute Rehab PT Goals PT Goal: Supine/Side to Sit - Progress: Progressing toward goal PT Goal: Sit to Supine/Side - Progress: Progressing toward goal PT Goal: Sit to Stand - Progress: Progressing toward goal PT Goal: Stand to Sit - Progress: Progressing toward goal PT Goal: Ambulate  - Progress: Progressing toward goal  Visit Information  Last PT Received On: 05/28/12 Assistance Needed: +1    Subjective Data      Cognition  Cognition Overall Cognitive Status: Appears within functional limits for tasks assessed/performed Arousal/Alertness: Awake/alert Orientation Level: Appears intact for tasks assessed Behavior During Session: [facility] for tasks performed    Balance     End of Session PT - End of Session Equipment Utilized During Treatment: Gait belt Activity Tolerance: Patient tolerated treatment well Patient left: in bed;with call bell/phone within reach;with nursing in room Nurse Communication: Mobility status   GP     Fredrich Birks 05/28/2012, 3:11 PM  05/28/2012 Fredrich Birks PTA [phone] pager [phone] office

## 2012-05-28 NOTE — Progress Notes (Signed)
Patient ID: [name], male   DOB: 28-Mar-1940, 72 y.o.   MRN: [id number]    Subjective: Pt feels well.  Wants to go home.  Objective: Vital signs in last 24 hours: Temp:  [97.5 F (36.4 C)-98.9 F (37.2 C)] 98.6 F (37 C) (03/10 0641) Pulse Rate:  [72-75] 72 (03/10 0641) Resp:  [18-20] 18 (03/10 0641) BP: (115-135)/(69-90) 115/69 mmHg (03/10 0641) SpO2:  [98 %-99 %] 98 % (03/10 0641) Last BM Date: 05/27/12  Intake/Output from previous day: 03/09 0701 - 03/10 0700 In: 1200 [P.O.:1200] Out: 2350 [Urine:2350] Intake/Output this shift:    PE: GU: Posterior scrotal wound is packed and soft.  No pain was evaluation.  No further cyst drainage or purulent drainage noted.  Lab Results:   Recent Labs  05/26/12 0710 05/27/12 1015  WBC 9.1 10.6*  HGB 14.0 13.3  HCT 40.4 38.3*  PLT 173 172   BMET  Recent Labs  05/26/12 0710 05/27/12 1355  NA 142 140  K 3.2* 3.8  CL 107 106  CO2 23 23  GLUCOSE 112* 109*  BUN 12 13  CREATININE 1.12 1.17  CALCIUM 8.9 8.4   PT/INR  Recent Labs  05/26/12 0505  LABPROT 14.3  INR 1.13   CMP     Component Value Date/Time   NA 140 05/27/2012 1355   K 3.8 05/27/2012 1355   CL 106 05/27/2012 1355   CO2 23 05/27/2012 1355   GLUCOSE 109* 05/27/2012 1355   BUN 13 05/27/2012 1355   CREATININE 1.17 05/27/2012 1355   CALCIUM 8.4 05/27/2012 1355   PROT 6.8 03/16/2012 1031   ALBUMIN 3.6 03/16/2012 1031   AST 13 03/16/2012 1031   ALT 11 03/16/2012 1031   ALKPHOS 64 03/16/2012 1031   BILITOT 0.9 03/16/2012 1031   GFRNONAA 61* 05/27/2012 1355   GFRAA 71* 05/27/2012 1355   Lipase  No results found for this basename: lipase       Studies/Results: No results found.  Anti-infectives: Anti-infectives   Start     Dose/Rate Route Frequency Ordered Stop   05/26/12 2000  vancomycin (VANCOCIN) 750 mg in sodium chloride 0.9 % 150 mL IVPB     750 mg 150 mL/hr over 60 Minutes Intravenous Every 12 hours 05/26/12 0831     05/24/12 0800   vancomycin (VANCOCIN) IVPB 1000 mg/200 mL premix  Status:  Discontinued     1,000 mg 200 mL/hr over 60 Minutes Intravenous Every 12 hours 05/23/12 2356 05/26/12 0831   05/24/12 0600  clindamycin (CLEOCIN) IVPB 900 mg     900 mg 100 mL/hr over 30 Minutes Intravenous 3 times per day 05/23/12 2356     05/23/12 1830  clindamycin (CLEOCIN) IVPB 600 mg     600 mg 100 mL/hr over 30 Minutes Intravenous  Once 05/23/12 1824 05/23/12 2037   05/23/12 1800  vancomycin (VANCOCIN) 2,000 mg in sodium chloride 0.9 % 500 mL IVPB     2,000 mg 250 mL/hr over 120 Minutes Intravenous NOW 05/23/12 1734 05/23/12 2237       Assessment/Plan  1. Scrotal sebaceous cyst  Plan: 1. Doing well.  Infection appears to be resolving.  Stable for dc home from our standpoint. 2. Will arrange for Navos for dressing changes and have him come back to clinic for a wound check. 3. 5-7 more days of oral abx should be all that is necessary.   LOS: 5 days    OSBORNE,KELLY E 05/28/2012, 7:44 AM Pager: [phone]

## 2012-05-28 NOTE — Telephone Encounter (Signed)
Still hospitalized.

## 2012-05-28 NOTE — Progress Notes (Signed)
ANTICOAGULATION CONSULT NOTE - Follow Up Consult  Pharmacy Consult for Coumadin Indication: atrial fibrillation  Allergies  Allergen Reactions  . Avelox (Moxifloxacin Hcl In Nacl) Swelling, Rash and Other (See Comments)    Patient became hypotensive after infusion started  . Penicillins     REACTION: anaphylaxis    Patient Measurements: Height: 6' 0.05" (183 cm) Weight: 250 lb 10.6 oz (113.7 kg) (scale c) IBW/kg (Calculated) : 77.71  Vital Signs: Temp: 98.6 F (37 C) (03/10 0641) BP: 115/69 mmHg (03/10 0641) Pulse Rate: 72 (03/10 0641)  Labs:  Recent Labs  05/26/12 0505  05/26/12 0710 05/27/12 1015 05/27/12 1355 05/28/12 0750  HGB  --   < > 14.0 13.3  --  13.6  HCT  --   --  40.4 38.3*  --  39.4  PLT  --   --  173 172  --  202  LABPROT 14.3  --   --   --   --  16.4*  INR 1.13  --   --   --   --  1.35  CREATININE  --   --  1.12  --  1.17  --   < > = values in this interval not displayed.  Estimated Creatinine Clearance: 75.4 ml/min (by C-G formula based on Cr of 1.17).  Assessment: 72yom resumed on coumadin 3/8 for afib. Received IV vitamin K 5mg  on 3/5. INR remains subtherapeutic but trending up after 2 doses of coumadin.  Home dose = 2.5mg  daily except 5mg  on Wednesday  Goal of Therapy:  INR 2-3 Monitor platelets by anticoagulation protocol: Yes   Plan:  1) Repeat coumadin 5mg  x 1 tonight 2) Follow up INR in AM  Fredrik Rigger 05/28/2012,10:16 AM

## 2012-05-28 NOTE — Progress Notes (Addendum)
CARE MANAGEMENT NOTE 05/28/2012  Patient:  [name], [name]   Account Number:  [id number]  Date Initiated:  05/28/2012  Documentation initiated by:  Vance Peper  Subjective/Objective Assessment:   72 yr old male s/p I & D of scrotal abscess.     Action/Plan:   CM spoke with patient concerning home health needs at discharge.Choice offered.  Patient states he has assistance at home. Has rolling walker.   Anticipated DC Date:  05/28/2012   Anticipated DC Plan:  HOME W HOME HEALTH SERVICES      DC Planning Services  CM consult      [facility] Choice  HOME HEALTH   Choice offered to / List presented to:  C-1 Patient        HH arranged  HH-1 RN    HH-2 PT  [facility] agency  [facility]   Status of service:  Completed, signed off Medicare Important Message given?   (If response is "NO", the following Medicare IM given date fields will be blank) Date Medicare IM given:   Date Additional Medicare IM given:    Discharge Disposition:  HOME W HOME HEALTH SERVICES  Per UR Regulation:    If discussed at Long Length of Stay Meetings, dates discussed:    Comments:

## 2012-05-29 DIAGNOSIS — M7989 Other specified soft tissue disorders: Secondary | ICD-10-CM

## 2012-05-29 LAB — GLUCOSE, CAPILLARY
Glucose-Capillary: 93 mg/dL (ref 70–99)
Glucose-Capillary: 100 mg/dL — ABNORMAL HIGH (ref 70–99)

## 2012-05-29 LAB — PROTIME-INR: Prothrombin Time: 19.7 seconds — ABNORMAL HIGH (ref 11.6–15.2)

## 2012-05-29 LAB — CBC
MCH: 29.8 pg (ref 26.0–34.0)
MCHC: 35.2 g/dL (ref 30.0–36.0)
Platelets: 206 10*3/uL (ref 150–400)
RDW: 13.9 % (ref 11.5–15.5)

## 2012-05-29 MED ORDER — COLCHICINE 0.6 MG PO TABS: 0.6000 mg | ORAL_TABLET | Freq: Two times a day (BID) | ORAL | Status: DC

## 2012-05-29 MED ORDER — WARFARIN SODIUM 5 MG PO TABS
5.0000 mg | ORAL_TABLET | Freq: Once | ORAL | Status: DC
  Filled 2012-05-29: qty 1

## 2012-05-29 NOTE — Progress Notes (Signed)
Physical Therapy Treatment Patient Details Name: [name] MRN: [id number] DOB: 12-08-1940 Today's Date: 05/29/2012 Time: 4098-1191 PT Time Calculation (min): 31 min  PT Assessment / Plan / Recommendation Comments on Treatment Session  Pt agreeable to ambulation and to practicing stairs today.  Cues required for technique.  Pt states he will be able to do what he needs to do.    Follow Up Recommendations  Home health PT;Supervision - Intermittent     Does the patient have the potential to tolerate intense rehabilitation     Barriers to Discharge        Equipment Recommendations  None recommended by PT    Recommendations for Other Services    Frequency Min 3X/week   Plan Discharge plan remains appropriate;Frequency remains appropriate    Precautions / Restrictions Precautions Precautions: Fall Restrictions Weight Bearing Restrictions: No   Pertinent Vitals/Pain Pt reports pain in his feet at 8/10.  Also has pain in L knee.    Mobility  Bed Mobility Bed Mobility: Supine to Sit;Sitting - Scoot to Edge of Bed Supine to Sit: 5: Supervision Sitting - Scoot to Edge of Bed: 5: Supervision Details for Bed Mobility Assistance: Pt uses rail to help. Transfers Transfers: Sit to Stand;Stand to Sit Sit to Stand: 4: Min guard;With upper extremity assist;From bed;From toilet Stand to Sit: 4: Min guard;With upper extremity assist;To bed;To toilet Details for Transfer Assistance: Cues for safe hand placement. Ambulation/Gait Ambulation/Gait Assistance: 4: Min guard Ambulation Distance (Feet): 300 Feet Assistive device: Rolling walker Ambulation/Gait Assistance Details: Cues for upright posture and to stay within RW.  1 seated rest. Gait Pattern: Step-through pattern;Decreased step length - right;Decreased step length - left;Antalgic;Trunk flexed;Wide base of support Stairs: Yes Stairs Assistance: 4: Min guard Stair Management Technique: Step to pattern;Forwards;Other  (comment);One rail Right (hand held on left) Number of Stairs: 2 Wheelchair Mobility Wheelchair Mobility: No    Exercises General Exercises - Lower Extremity Short Arc Quad: AROM;Strengthening;10 reps;Both Toe Raises: AROM;Strengthening;Both;10 reps Heel Raises: AROM;Strengthening;Both;10 reps   PT Diagnosis:    PT Problem List:   PT Treatment Interventions:     PT Goals Acute Rehab PT Goals PT Goal: Supine/Side to Sit - Progress: Progressing toward goal PT Goal: Sit to Supine/Side - Progress: Progressing toward goal PT Goal: Sit to Stand - Progress: Progressing toward goal PT Goal: Stand to Sit - Progress: Progressing toward goal PT Goal: Ambulate - Progress: Progressing toward goal  Visit Information  Last PT Received On: 05/29/12 Assistance Needed: +1    Subjective Data  Subjective: Pt reports his feet are still very painful and his L knee is hurting today.  His wife indicated he has started on medication for his gout.   Cognition  Cognition Overall Cognitive Status: Appears within functional limits for tasks assessed/performed Arousal/Alertness: Awake/alert Orientation Level: Appears intact for tasks assessed Behavior During Session: [facility] for tasks performed    Balance     End of Session PT - End of Session Equipment Utilized During Treatment: Gait belt Activity Tolerance: Patient tolerated treatment well;Patient limited by pain Patient left: in bed;with call bell/phone within reach;with family/visitor present Nurse Communication: Mobility status   GP     Enid Baas, SPTA 05/29/2012, 12:50 PM

## 2012-05-29 NOTE — Clinical Documentation Improvement (Signed)
CHF DOCUMENTATION CLARIFICATION QUERY  CLINICAL DOCUMENTATION QUERIES ARE NOT PART OF THE PERMANENT MEDICAL RECORD    Please update your documentation within the medical record to reflect your response to this query.                                                                                     05/29/12  Dr. Blake Divine and/or Associates,  Review of [name] medical record show conflicting information regarding his diagnosis of congestive heart failure:  Dr. Conley Rolls has documented "hx of ischemic CMP, Vtach s/p IVCD, systolic CHF with EF 40%" in the H&P.  "Chronic diastolic heart failure: resume lasix" is documented in your progress notes starting on 05/26/12.  Medications prior to admission included: Coreg  Lasix (No Ace-I or ARB 2/2 Stage 3 CKD or another reason?)    Based on your clinical judgment, please clarify and document in the progress notes and discharge summary the Acuity and Type of Heart Failure.  In responding to this query please exercise your independent judgment.    The fact that a query is asked, does not imply that any particular answer is desired or expected.  Reviewed:   Thank You,  [name]  RN BSN CCDS Certified Clinical Documentation Specialist: Cell   [phone]  Health Information Management [place]    TO RESPOND TO THE THIS QUERY, FOLLOW THE INSTRUCTIONS BELOW:  1. If needed, update documentation for the patient's encounter via the notes activity.  2. Access this query again and click edit on the In [employer].  3. After updating, or not, click F2 to complete all highlighted (required) fields concerning your review. Select "additional documentation in the medical record" OR "no additional documentation provided".  4. Click Sign note button.  5. The deficiency will fall out of your In Basket *Please let us know if you are not able to complete this workflow by phone or e-mail (listed below).

## 2012-05-29 NOTE — Discharge Summary (Signed)
Physician Discharge Summary  Seibert Keeter ZOX:[id number] DOB: 02/20/1941 DOA: 05/23/2012  PCP: [name], MD  Admit date: 05/23/2012 Discharge date: 05/29/2012    Recommendations for Outpatient Follow-up:  1. Follow up with surgery as recommended  Discharge Diagnoses:  Principal Problem:   Cellulitis of scrotum Active Problems:   DIABETES MELLITUS, CONTROLLED   CKD (chronic kidney disease), stage III   BACK PAIN, LUMBAR   Hypertension   S/P ICD (internal cardiac defibrillator) procedure   Warfarin anticoagulation   Atrial fibrillation   Cardiomyopathy   Encounter for long-term (current) use of anticoagulants   Renal insufficiency   Carotid artery disease   Discharge Condition: fair  Diet recommendation: low salt diet  Filed Weights   05/24/12 0455 05/25/12 0518 05/26/12 0624  Weight: 113.2 kg (249 lb 9 oz) 114.034 kg (251 lb 6.4 oz) 113.7 kg (250 lb 10.6 oz)    History of present illness:   [name] is an 72 y.o. male with hx of ischemic CMP, Vtach s/p IVCD, systolic CHF with EF 40%, HTN, hx of left atrial thrombus, DM, hyperlipidemia, atrial fibrillation, presents to the ER with swelling of his scrotal for a few days. He started to squeeze the small pustule, then use a needle, burned in alcohol to sterilize it and did an I and D himself. The area of puncture became [name] and more painful. He has no fever or chills. Evaluation in the ER included a pelvic CT which showed no abcesses. He has a WBC of 16K with normal renal fx tests. He was seen in consultation with Dr Lindie Spruce of surgery and he recommended IV antibiotic at this point. His INR is 1.68 at this time. Hospitalist was asked to admit him for scrotal cellulitis/ early Fournier.   Hospital Course:  1. Scrotal cellulitis: s/p I&D , draining.Received one week of IV antibiotics. Changed to po to complete the course. Set up Home health RN for daily dressing changes. Recommend following up with  surgery for further recommendations as outpatient.  2. Chronic renal insufficiency: stable.  3. Hypertension: controlled. Resume home medications.  4. Atrial fibrillation: rate controlled. Resume coumadin.  5. Diabetes Mellitus:  CBG (last 3)   Recent Labs   05/28/12 0719  05/28/12 1129  05/28/12 1613   GLUCAP  82  104*  84    Resume home medications   7. Chronic diastolic heart failure: resume lasix. No indication of ACE or ARB as his LV EF is within normal limits and he has chronic renal insufficiency.  8. rigth ankle swelling and redness : venous dopplers to ruled out DVT and colchicine ordered for possible gout.    Procedures:  S/p I&D in ED.   Consultations:  Surgery   Discharge Exam: Filed Vitals:   05/28/12 0641 05/28/12 2016 05/28/12 2113 05/29/12 0519  BP: 115/69 127/77 123/70 127/70  Pulse: 72 77  85  Temp: 98.6 F (37 C) 98.3 F (36.8 C)  98.5 F (36.9 C)  TempSrc:  Oral  Oral  Resp: 18 16  16   Height:      Weight:      SpO2: 98% 91%  97%   CHEST: Normal respiration, clear to auscultation bilaterally.  HEART: Regular rate and rhythm. There are no murmur, rub, or gallops.  BACK: No kyphosis or scoliosis; no CVA tenderness  ABDOMEN: soft and non-tender; no masses, no organomegaly, normal abdominal bowel sounds; no pannus; no intertriginous candida. There is no rebound and no distention.  EXTREMITIES:slight edema  and tenderness of the R ankle.  Discharge Instructions  Discharge Orders   Future Appointments Provider Department Dept Phone   05/31/2012 1:30 PM [facility] [phone]   05/31/2012 1:45 PM [facility] Main [facility]) [phone]   06/11/2012 11:40 AM [facility] Rem[facility] Main Office [place]) [phone]   06/12/2012 1:15 PM [facility], [name] [phone]   06/25/2012 10:30 AM [facility]   [place] [phone]   06/27/2012 1:30 PM [facility] [phone]   06/27/2012 1:45 PM [name], MD [facility] Main [facility] [place]) [phone]   Future Orders Complete By Expires     (HEART FAILURE PATIENTS) Call MD:  Anytime you have any of the following symptoms: 1) 3 pound weight gain in 24 hours or 5 pounds in 1 week 2) shortness of breath, with or without a dry hacking cough 3) swelling in the hands, feet or stomach 4) if you have to sleep on extra pillows at night in order to breathe.  As directed     Activity as tolerated - No restrictions  As directed     Activity as tolerated - No restrictions  As directed     Call MD for:  difficulty breathing, headache or visual disturbances  As directed     Call MD for:  persistant dizziness or light-headedness  As directed     Call MD for:  redness, tenderness, or signs of infection (pain, swelling, redness, odor or green/yellow discharge around incision site)  As directed     Diet - low sodium heart healthy  As directed     Diet - low sodium heart healthy  As directed     Discharge instructions  As directed     Comments:      FOLLOW with surgery as recommended.  Dressing changes as per home health RN.    Discharge instructions  As directed     Comments:      FOLLOW UP with surgery as recommended Follow up with [facility] with dressing changes.  Follow up with PCP i one week for the GOUT Check INR on Thursday.        Medication List    TAKE these medications       allopurinol 100 MG tablet  Commonly known as:  ZYLOPRIM  Take 50 mg by mouth daily.     amLODipine 5 MG tablet  Commonly known as:  NORVASC  Take 1 tablet (5 mg total) by mouth 2 (two) times daily.     aspirin 81 MG tablet  Take 81 mg by mouth once a week. sunday     atorvastatin 10 MG tablet  Commonly known as:  LIPITOR  Take 1 tablet (10 mg total) by mouth daily.     B-1 PO  Take 125 mcg by mouth  daily.     carvedilol 25 MG tablet  Commonly known as:  COREG  Take 12.5 mg by mouth 2 (two) times daily with a meal.     cholecalciferol 1000 UNITS tablet  Commonly known as:  VITAMIN D  Take 1,000 Units by mouth daily.     clindamycin 300 MG capsule  Commonly known as:  CLEOCIN  Take 1 capsule (300 mg total) by mouth 3 (three) times daily.     colchicine 0.6 MG tablet  Take 1 tablet (0.6 mg total) by mouth 2 (two) times  daily.     furosemide 80 MG tablet  Commonly known as:  LASIX  Take 1 tablet (80 mg total) by mouth 2 (two) times daily.     glimepiride 2 MG tablet  Commonly known as:  AMARYL  Take 1 mg by mouth daily before breakfast.     ipratropium 0.06 % nasal spray  Commonly known as:  ATROVENT  Place 1 spray into the nose daily.     NEURONTIN 100 MG capsule  Generic drug:  gabapentin  Take 100 mg by mouth 2 (two) times daily.     nitroGLYCERIN 0.4 MG SL tablet  Commonly known as:  NITROSTAT  Place 0.4 mg under the tongue every 5 (five) minutes as needed.     Oxycodone HCl 10 MG Tabs  Take 1 tablet (10 mg total) by mouth every 4 (four) hours as needed.     potassium chloride SA 20 MEQ tablet  Commonly known as:  K-DUR,KLOR-CON  Take 1 tablet (20 mEq total) by mouth 2 (two) times daily.     warfarin 2.5 MG tablet  Commonly known as:  COUMADIN  Take 2.5-5 mg by mouth every evening. Pt taking 2.5 mg every day except 5 mg on wed           Follow-up Information   Follow up with [facility] [place] On 06/12/2012. (11:45am, arrive at 11:15am)    Contact information:   [address]   [place] [address] [phone]       Follow up with [name], MD. Schedule an appointment as soon as possible for a visit in 1 week.   Contact information:   [address] [facility] [address] [place] [address] [phone]        The results of significant diagnostics from this hospitalization (including imaging, microbiology,  ancillary and laboratory) are listed below for reference.    Significant Diagnostic Studies: Dg Chest 2 View  05/23/2012  *RADIOLOGY REPORT*  Clinical Data: Antral fibrillation  CHEST - 2 VIEW  Comparison: Chest radiograph 05/30/2011  Findings: Sternotomy wires overlie stable enlarged cardiac silhouette. There is stimulator devices within the central canal of the thoracic spine which is new from prior.  There is a left effusion and atelectasis is unchanged from prior. Bulky osteophytosis of the spine.  IMPRESSION:  1.  No significant interval change other than the hardware described. 2.  Left lower lobe atelectasis and small effusion. 3.  Cardiomegaly. 4. Degenerative change in the spine.   Original Report Authenticated By: Genevive Bi, M.D.    Ct Pelvis W Contrast  05/23/2012  *RADIOLOGY REPORT*  Clinical Data:  Fever.  Possible abscess of the thighs.  CT PELVIS WITH CONTRAST  Technique:  Multidetector CT imaging of the pelvis was performed using the standard protocol following the bolus administration of intravenous contrast.  Contrast: OMNIPAQUE IOHEXOL 300 MG/ML  SOLN  Comparison:   None.  Findings:  There is a large skin ulceration of the perineum at the base of the scrotum.  The ulceration may be packed.  No adjacent fluid collection is identified.  The patient does have bilateral hydroceles which appear large and greater on the right.  Upper thighs are unremarkable.  Imaged intrapelvic contents demonstrate no focal abnormality.  Atherosclerotic vascular disease is seen. The left common femoral artery is mildly dilated at 2.1 cm.  There is no lymphadenopathy or fluid within the pelvis.  No focal bony abnormality is identified.  The patient is status post lower lumbar fusion.  IMPRESSION:  1.  Large peroneal skin ulceration at the base of the scrotum without underlying abscess. 2.  Large appearing bilateral hydroceles, greater on the right. 3.  Mild dilatation of the left common femoral artery  2.1 cm.   Original Report Authenticated By: Holley Dexter, M.D.    US Scrotum  05/25/2012  *RADIOLOGY REPORT*  Clinical Data:  Worsening pain and swelling.  SCROTAL ULTRASOUND DOPPLER ULTRASOUND OF THE TESTICLES  Technique: Complete ultrasound examination of the testicles, epididymis, and other scrotal structures was performed.  Color and spectral Doppler ultrasound were also utilized to evaluate blood flow to the testicles.  Comparison:  CT 05/23/2012.  Findings:  Right testis:  5.1 x 1.8 x 2.7 cm.  Normal gray scale and color Doppler appearance.  Left testis:  2.7 x 1.5 x 2.2 cm.  Normal gray scale and color Doppler appearance.  Right epididymis:  Normal in size and appearance.  Left epididymis:  Normal in size and appearance.  Hydrocele:  Moderate right-sided.  Varicocele:  Absent  Pulsed Doppler interrogation of both testes demonstrates low resistance flow bilaterally.  A suggestion of left hemiscrotal skin thickening and hyperemia, including on image 42.  The technologist labeled this as the epididymis, but the epididymis is felt to be positioned immediately adjacent the testicle, including on image 42.  IMPRESSION:  1.  No evidence of orchitis or testicular torsion. 2.  Moderate right sided hydrocele. 3.  Suggestion of left scrotal skin thickening and hyperemia. Question cellulitis.  Consider physical exam correlation.   Original Report Authenticated By: Jeronimo Greaves, M.D.    [place] Art/ven Flow Abd Pelv Doppler  05/25/2012  *RADIOLOGY REPORT*  Clinical Data:  Worsening pain and swelling.  SCROTAL ULTRASOUND DOPPLER ULTRASOUND OF THE TESTICLES  Technique: Complete ultrasound examination of the testicles, epididymis, and other scrotal structures was performed.  Color and spectral Doppler ultrasound were also utilized to evaluate blood flow to the testicles.  Comparison:  CT 05/23/2012.  Findings:  Right testis:  5.1 x 1.8 x 2.7 cm.  Normal gray scale and color Doppler appearance.  Left testis:  2.7 x 1.5 x  2.2 cm.  Normal gray scale and color Doppler appearance.  Right epididymis:  Normal in size and appearance.  Left epididymis:  Normal in size and appearance.  Hydrocele:  Moderate right-sided.  Varicocele:  Absent  Pulsed Doppler interrogation of both testes demonstrates low resistance flow bilaterally.  A suggestion of left hemiscrotal skin thickening and hyperemia, including on image 42.  The technologist labeled this as the epididymis, but the epididymis is felt to be positioned immediately adjacent the testicle, including on image 42.  IMPRESSION:  1.  No evidence of orchitis or testicular torsion. 2.  Moderate right sided hydrocele. 3.  Suggestion of left scrotal skin thickening and hyperemia. Question cellulitis.  Consider physical exam correlation.   Original Report Authenticated By: Jeronimo Greaves, M.D.     Microbiology: Recent Results (from the past 240 hour(s))  CULTURE, BLOOD (ROUTINE X 2)     Status: None   Collection Time    05/23/12  5:15 PM      Result Value Range Status   Specimen Description BLOOD RIGHT ANTECUBITAL   Final   Special Requests BOTTLES DRAWN AEROBIC AND ANAEROBIC 10CC   Final   Culture  Setup Time 05/24/2012 00:59   Final   Culture     Final   Value:  BLOOD CULTURE RECEIVED NO GROWTH TO DATE CULTURE WILL BE HELD FOR 5 DAYS BEFORE ISSUING A FINAL NEGATIVE REPORT   Report Status PENDING   Incomplete  CULTURE, BLOOD (ROUTINE X 2)     Status: None   Collection Time    05/23/12  5:30 PM      Result Value Range Status   Specimen Description BLOOD ARM RIGHT   Final   Special Requests BOTTLES DRAWN AEROBIC AND ANAEROBIC 10CC   Final   Culture  Setup Time 05/24/2012 00:59   Final   Culture     Final   Value:        BLOOD CULTURE RECEIVED NO GROWTH TO DATE CULTURE WILL BE HELD FOR 5 DAYS BEFORE ISSUING A FINAL NEGATIVE REPORT   Report Status PENDING   Incomplete     Labs: Basic Metabolic Panel:  Recent Labs Lab 05/23/12 1712 05/26/12 0710 05/27/12 1355  NA  137 142 140  K 4.0 3.2* 3.8  CL 99 107 106  CO2 24 23 23   GLUCOSE 189* 112* 109*  BUN 25* 12 13  CREATININE 1.29 1.12 1.17  CALCIUM 9.2 8.9 8.4   Liver Function Tests: No results found for this basename: AST, ALT, ALKPHOS, BILITOT, PROT, ALBUMIN,  in the last 168 hours No results found for this basename: LIPASE, AMYLASE,  in the last 168 hours No results found for this basename: AMMONIA,  in the last 168 hours CBC:  Recent Labs Lab 05/25/12 0645 05/26/12 0710 05/27/12 1015 05/28/12 0750 05/29/12 0644  WBC 11.2* 9.1 10.6* 9.5 8.9  HGB 13.4 14.0 13.3 13.6 13.4  HCT 38.2* 40.4 38.3* 39.4 38.1*  MCV 87.8 88.0 88.7 86.0 84.9  PLT 214 173 172 202 206   Cardiac Enzymes: No results found for this basename: CKTOTAL, CKMB, CKMBINDEX, TROPONINI,  in the last 168 hours BNP: BNP (last 3 results) No results found for this basename: PROBNP,  in the last 8760 hours CBG:  Recent Labs Lab 05/28/12 1129 05/28/12 1613 05/28/12 2051 05/29/12 0635 05/29/12 1139  GLUCAP 104* 84 112* 93 100*       Signed:  [name],[name]  [facility] 05/29/2012, 12:09 PM

## 2012-05-29 NOTE — Progress Notes (Signed)
*  PRELIMINARY RESULTS* Vascular Ultrasound Carotid Duplex (Doppler) has been completed.  Preliminary findings: Right:  No evidence of DVT, superficial thrombosis, or Baker's cyst.  Farrel Demark, RDMS, RVT  05/29/2012, 8:33 AM

## 2012-05-29 NOTE — Progress Notes (Signed)
ANTICOAGULATION CONSULT NOTE - Follow Up Consult  Pharmacy Consult for Coumadin Indication: atrial fibrillation  Allergies  Allergen Reactions  . Avelox (Moxifloxacin Hcl In Nacl) Swelling, Rash and Other (See Comments)    Patient became hypotensive after infusion started  . Penicillins     REACTION: anaphylaxis    Patient Measurements: Height: 6' 0.05" (183 cm) Weight: 250 lb 10.6 oz (113.7 kg) (scale c) IBW/kg (Calculated) : 77.71  Vital Signs: Temp: 98.5 F (36.9 C) (03/11 0519) Temp src: Oral (03/11 0519) BP: 127/70 mmHg (03/11 0519) Pulse Rate: 85 (03/11 0519)  Labs:  Recent Labs  05/27/12 1015 05/27/12 1355 05/28/12 0750 05/29/12 0644  HGB 13.3  --  13.6 13.4  HCT 38.3*  --  39.4 38.1*  PLT 172  --  202 206  LABPROT  --   --  16.4* 19.7*  INR  --   --  1.35 1.73*  CREATININE  --  1.17  --   --     Estimated Creatinine Clearance: 75.4 ml/min (by C-G formula based on Cr of 1.17).  Assessment: 72yom resumed on coumadin 3/8 for afib. Received IV vitamin K 5mg  on 3/5. INR remains subtherapeutic but continues to trend up nicely with 5mg  doses.  Home dose = 2.5mg  daily except 5mg  on Wednesday  Goal of Therapy:  INR 2-3 Monitor platelets by anticoagulation protocol: Yes   Plan:  1) Repeat coumadin 5mg  x 1 tonight 2) Follow up INR in AM  Fredrik Rigger 05/29/2012,9:06 AM

## 2012-05-29 NOTE — Progress Notes (Signed)
Seen and agreed 05/29/2012 Robinette, Julia Elizabeth PTA [phone] pager [phone] office    

## 2012-05-30 LAB — CULTURE, BLOOD (ROUTINE X 2): Culture: NO GROWTH

## 2012-05-30 NOTE — Telephone Encounter (Signed)
Weight=240#.  He states his legs are swelling but he is having an acute attack of gout.  He denies sob.

## 2012-05-31 ENCOUNTER — Ambulatory Visit: Payer: Self-pay | Admitting: Cardiovascular Disease

## 2012-05-31 ENCOUNTER — Other Ambulatory Visit: Payer: Medicare Other

## 2012-05-31 DIAGNOSIS — Z7901 Long term (current) use of anticoagulants: Secondary | ICD-10-CM

## 2012-05-31 DIAGNOSIS — I4891 Unspecified atrial fibrillation: Secondary | ICD-10-CM

## 2012-05-31 LAB — POCT INR: INR: 2.9

## 2012-06-05 ENCOUNTER — Encounter: Payer: Self-pay | Admitting: Internal Medicine

## 2012-06-05 ENCOUNTER — Ambulatory Visit (INDEPENDENT_AMBULATORY_CARE_PROVIDER_SITE_OTHER): Payer: Medicare Other | Admitting: Internal Medicine

## 2012-06-05 VITALS — BP 116/74 | HR 84 | Temp 97.4°F | Wt 239.0 lb

## 2012-06-05 DIAGNOSIS — T889XXS Complication of surgical and medical care, unspecified, sequela: Secondary | ICD-10-CM

## 2012-06-05 DIAGNOSIS — E119 Type 2 diabetes mellitus without complications: Secondary | ICD-10-CM

## 2012-06-05 DIAGNOSIS — N498 Inflammatory disorders of other specified male genital organs: Secondary | ICD-10-CM

## 2012-06-05 DIAGNOSIS — N492 Inflammatory disorders of scrotum: Secondary | ICD-10-CM

## 2012-06-05 DIAGNOSIS — T8189XS Other complications of procedures, not elsewhere classified, sequela: Secondary | ICD-10-CM

## 2012-06-05 NOTE — Progress Notes (Signed)
  Subjective:    Patient ID: [name], male    DOB: [birth year], 72 y.o.   MRN: [id number]  HPI Hospital records were reviewed; the discharge summary is incomplete. Discharge diagnosis was cellulitis of the scrotum. Apparently he had incision and drainage on at least one occasion of the wound. He has completed his clindamycin 300 mg 3 times a day.  His wife is packing the wound daily. He must wear a pad because of some scant purulent drainage with some bloody discharge.  He denies fever, chills, or sweats at this time. He has loose stool w/o diarrhea.    Review of Systems He became somewhat deconditioned in the hospital;he is receiving home physical therapy. He also lost 10 pounds during illness. His diabetic control has improved with weight loss and with treatment of the infection. FBS 100-130 @ home.     Objective:   Physical Exam   He appears adequately nourished in no distress  He has no lymphadenopathy about the neck, axilla, or inguinal areas  Heart sounds are somewhat distant; rate is slow and irregular  Breath sounds are decreased but there are no rales or other abnormal breath sounds  There is a surgical wound 3.5 cm x 1 cm in in the peroneal area. There is no erythema or purulence        Assessment & Plan:  #1 perineal wound without evidence of active infection; abscess resolved.  #2 some change in stools with the clindamycin.  No additional antibiotics are indicated unless he develops fever or purulence from the wound. Align will be recommended if he has loose-watery stools.

## 2012-06-05 NOTE — Telephone Encounter (Signed)
Weight today=233#.  He denies sob but does have some edema in his feet.  Gout is better.  BP=118/80.  P=67.

## 2012-06-05 NOTE — Patient Instructions (Addendum)
Report Warning Signs (pain, redness, pus & fever) as discussed. Please take the probiotic , Align, every day until the bowels are normal. This will replace the normal bacteria which  are necessary for formation of normal stool and processing of food.   06/12/2012  Home Health Certification  & Plan of Care  For 3/12-5/10/14  reviewed & completed. Discrepancies noted between EMR Med List & meds listed on form . Handwritten notes made on form .

## 2012-06-06 ENCOUNTER — Ambulatory Visit (INDEPENDENT_AMBULATORY_CARE_PROVIDER_SITE_OTHER): Payer: Medicare Other | Admitting: Cardiology

## 2012-06-06 DIAGNOSIS — Z7901 Long term (current) use of anticoagulants: Secondary | ICD-10-CM

## 2012-06-06 DIAGNOSIS — I4891 Unspecified atrial fibrillation: Secondary | ICD-10-CM

## 2012-06-06 LAB — POCT INR: INR: 1.7

## 2012-06-06 NOTE — Telephone Encounter (Signed)
Weight=233#.  Edema and sob are slight.  BP=128/85.  P=68.

## 2012-06-07 NOTE — Telephone Encounter (Signed)
Weight today=233#.  He states his edema and sob are both slight.  BP=127/86. P=73

## 2012-06-11 ENCOUNTER — Telehealth: Payer: Self-pay | Admitting: Cardiology

## 2012-06-11 ENCOUNTER — Other Ambulatory Visit: Payer: Self-pay | Admitting: Internal Medicine

## 2012-06-11 ENCOUNTER — Ambulatory Visit (INDEPENDENT_AMBULATORY_CARE_PROVIDER_SITE_OTHER): Payer: Medicare Other | Admitting: *Deleted

## 2012-06-11 DIAGNOSIS — I428 Other cardiomyopathies: Secondary | ICD-10-CM

## 2012-06-11 DIAGNOSIS — I509 Heart failure, unspecified: Secondary | ICD-10-CM

## 2012-06-11 DIAGNOSIS — I429 Cardiomyopathy, unspecified: Secondary | ICD-10-CM

## 2012-06-11 DIAGNOSIS — Z95810 Presence of automatic (implantable) cardiac defibrillator: Secondary | ICD-10-CM

## 2012-06-11 NOTE — Telephone Encounter (Signed)
Lab results from blood draw by [facility] nurse.  Results received from patient.  They are as follows: K+=3.5 Na=137 Cl=100 CO2=27 Glucose=157 BUN=16 Creat=1.22 Ca=8.2  Will route labs to Dr Myrtis Ser for his review.

## 2012-06-11 NOTE — Telephone Encounter (Signed)
New problem    pts wife returning your call

## 2012-06-11 NOTE — Telephone Encounter (Signed)
Weight=238# today, (up 5# from Thursday).  He states his edema is slight and he denies sob.  BP=119/80.  P=72.  He decrease his lasix to 40 mg bid per Dr Henrietta Hoover instructions on Friday.

## 2012-06-12 ENCOUNTER — Ambulatory Visit (INDEPENDENT_AMBULATORY_CARE_PROVIDER_SITE_OTHER): Payer: Medicare Other | Admitting: General Surgery

## 2012-06-12 ENCOUNTER — Encounter (INDEPENDENT_AMBULATORY_CARE_PROVIDER_SITE_OTHER): Payer: Self-pay

## 2012-06-12 ENCOUNTER — Encounter (INDEPENDENT_AMBULATORY_CARE_PROVIDER_SITE_OTHER): Payer: Medicare Other

## 2012-06-12 ENCOUNTER — Telehealth (INDEPENDENT_AMBULATORY_CARE_PROVIDER_SITE_OTHER): Payer: Self-pay | Admitting: General Surgery

## 2012-06-12 VITALS — BP 118/80 | HR 82 | Temp 96.8°F | Ht 72.0 in | Wt 247.6 lb

## 2012-06-12 DIAGNOSIS — N498 Inflammatory disorders of other specified male genital organs: Secondary | ICD-10-CM

## 2012-06-12 DIAGNOSIS — N492 Inflammatory disorders of scrotum: Secondary | ICD-10-CM

## 2012-06-12 NOTE — Patient Instructions (Signed)
Continue with daily wet to dry dressing changes.  Follow up in 2 weeks or sooner should new or concerning symptoms develop.

## 2012-06-12 NOTE — Telephone Encounter (Signed)
Called patient and asked if they could arrive earlier for today's visit.

## 2012-06-12 NOTE — Telephone Encounter (Signed)
Weight=239#.  No change in edema or sob.  BP=118/80.  P=86

## 2012-06-12 NOTE — Progress Notes (Signed)
Subjective:     Patient ID: [name], male   DOB: 1940-12-16, 72 y.o.   MRN: [id number]  HPI 72 year old male presents for scrotal abscess follow up.  The wife reports on Sunday noticing small amount of blood on dressing.  He reports resuming physical therapy and walking without a walker last Friday.  Home health will be coming to change the dressing tomorrow morning for the last time, wife able to change without any difficulties.  He denies pain, fever or chills.  No recent changes in coumadin dose, denies hematuria gum or nose bleeds.  Coumadin is managed by Dr. Alwyn Ren and f/u is scheduled on 06/28/12.   Review of Systems  Constitutional: Negative for fever and chills.  Skin: Negative for color change and rash.       Objective:   Physical Exam  Skin:          Assessment:    scrotal abscess    Plan:     Improving, continue with daily dressing changes.  Dressing changed today.  Follow up in 2 weeks.  The patient and wife were instructed to call immediately or seek immediate attention should bleeding increase.

## 2012-06-13 ENCOUNTER — Telehealth (INDEPENDENT_AMBULATORY_CARE_PROVIDER_SITE_OTHER): Payer: Self-pay | Admitting: General Surgery

## 2012-06-13 ENCOUNTER — Ambulatory Visit (INDEPENDENT_AMBULATORY_CARE_PROVIDER_SITE_OTHER): Payer: Medicare Other | Admitting: Cardiology

## 2012-06-13 DIAGNOSIS — I4891 Unspecified atrial fibrillation: Secondary | ICD-10-CM

## 2012-06-13 DIAGNOSIS — Z7901 Long term (current) use of anticoagulants: Secondary | ICD-10-CM

## 2012-06-13 NOTE — Telephone Encounter (Signed)
April from [facility] called in to let us know that she was packing his scrotal abscess and in the meantime she had seen two other spots that looked suspicious.  They were red "pimple-like" bumps.  She is concerned because he is not currently on any antibiotics and whether or not the patient needed to be seen again concerning these new spots. Patient claims no pain at those sites, no fever, chills, excessive redness or swelling. Informed her I would send this message to Dr. Dwain Sarna and see if he would like to call in an antibiotic or if he would prefer the patient to be seen again.

## 2012-06-13 NOTE — Telephone Encounter (Signed)
Weight=238#.  He states he has very very slight edema (hardly noticeable) and denies sob.  BP=128/85.  P=66

## 2012-06-14 ENCOUNTER — Telehealth (INDEPENDENT_AMBULATORY_CARE_PROVIDER_SITE_OTHER): Payer: Self-pay

## 2012-06-14 NOTE — Telephone Encounter (Signed)
Called pt's wife who her name is also Arnez Stoneking and spoke to her about the message that the home health nurse left yesterday of the red pimple-like bumps near the scrotal abscess. I asked Dr Dwain Sarna about this pt and he advised for the pt to be seen in urgent office today. I tried to make an appt for the pt but they declined b/c they are out of town until Monday. I advised them to call our office if things get worse and need to be seen next week.

## 2012-06-14 NOTE — Telephone Encounter (Signed)
I don't remember who this is. I think I saw once in follow up somehow but I never saw in hospital.  Please look and see who is responsible from hospital.

## 2012-06-18 ENCOUNTER — Telehealth: Payer: Self-pay | Admitting: Internal Medicine

## 2012-06-18 NOTE — Telephone Encounter (Signed)
Weight today=238#.  He denies sob but states he has very slight edema.  BP=135/81.  P=79.

## 2012-06-18 NOTE — Telephone Encounter (Signed)
New Problem:   Patient's wife called in because he sent a transmission on 06/11/12 and received a note on mychrat that they did not send it.  Please call back to let her know if they need to send another transmission.  Please call back.

## 2012-06-19 NOTE — Telephone Encounter (Signed)
LMOM in regards to transmissions. Transmission was received. Any other questions pt instructed to return call.

## 2012-06-20 ENCOUNTER — Encounter (INDEPENDENT_AMBULATORY_CARE_PROVIDER_SITE_OTHER): Payer: Self-pay | Admitting: Surgery

## 2012-06-20 ENCOUNTER — Ambulatory Visit (INDEPENDENT_AMBULATORY_CARE_PROVIDER_SITE_OTHER): Payer: Medicare Other | Admitting: Internal Medicine

## 2012-06-20 ENCOUNTER — Ambulatory Visit (INDEPENDENT_AMBULATORY_CARE_PROVIDER_SITE_OTHER): Payer: Medicare Other | Admitting: Surgery

## 2012-06-20 VITALS — BP 125/73 | HR 70 | Temp 97.8°F | Resp 16 | Ht 72.0 in | Wt 246.2 lb

## 2012-06-20 DIAGNOSIS — N498 Inflammatory disorders of other specified male genital organs: Secondary | ICD-10-CM

## 2012-06-20 DIAGNOSIS — I4891 Unspecified atrial fibrillation: Secondary | ICD-10-CM

## 2012-06-20 DIAGNOSIS — N492 Inflammatory disorders of scrotum: Secondary | ICD-10-CM

## 2012-06-20 DIAGNOSIS — Z7901 Long term (current) use of anticoagulants: Secondary | ICD-10-CM

## 2012-06-20 LAB — REMOTE ICD DEVICE
AL AMPLITUDE: 0.8 mv
ATRIAL PACING ICD: 0 pct
CHARGE TIME: 10.81 s
FVT: 0
LV LEAD THRESHOLD: 3.5 V
PACEART VT: 0
RV LEAD AMPLITUDE: 15.5 mv
RV LEAD IMPEDENCE ICD: 475 Ohm
TOT-0001: 1
TZAT-0001FASTVT: 1
TZAT-0002ATACH: NEGATIVE
TZAT-0004FASTVT: 8
TZAT-0005FASTVT: 88 pct
TZAT-0011SLOWVT: 10 ms
TZAT-0011SLOWVT: 10 ms
TZAT-0012ATACH: 150 ms
TZAT-0012FASTVT: 200 ms
TZAT-0012SLOWVT: 200 ms
TZAT-0012SLOWVT: 200 ms
TZAT-0013FASTVT: 1
TZAT-0013SLOWVT: 2
TZAT-0013SLOWVT: 2
TZAT-0018ATACH: NEGATIVE
TZAT-0018FASTVT: NEGATIVE
TZAT-0019ATACH: 6 V
TZAT-0019SLOWVT: 8 V
TZAT-0019SLOWVT: 8 V
TZAT-0020ATACH: 1.5 ms
TZAT-0020ATACH: 1.5 ms
TZON-0003ATACH: 350 ms
TZON-0003FASTVT: 240 ms
TZON-0003SLOWVT: 340 ms
TZON-0004SLOWVT: 24
TZON-0005SLOWVT: 12
TZST-0001ATACH: 4
TZST-0001ATACH: 5
TZST-0001FASTVT: 2
TZST-0001FASTVT: 5
TZST-0001SLOWVT: 4
TZST-0002ATACH: NEGATIVE
TZST-0003FASTVT: 35 J
TZST-0003FASTVT: 35 J
TZST-0003SLOWVT: 15 J
TZST-0003SLOWVT: 25 J
TZST-0003SLOWVT: 35 J
VF: 0

## 2012-06-20 NOTE — Progress Notes (Signed)
Subjective:     Patient ID: [name], male   DOB: [birth year], 72 y.o.   MRN: [id number]  HPI He is here for another evaluation of his scrotal abscess. His wife was concerned about the wound  Review of Systems     Objective:   Physical Exam On exam, there is excellent granulation tissue. There is a mild amount of fibrinous exudate on the lateral edges of the wound which I cleaned out with a Q-tip. I then freed the wounds were nitrate. I showed this to his wife    Assessment:     Healing scrotal abscess     Plan:     There continued local wound care and I will see him back in several weeks

## 2012-06-21 ENCOUNTER — Telehealth: Payer: Self-pay

## 2012-06-21 NOTE — Telephone Encounter (Signed)
Weight=236#.  He states his sob and edema is slight.  BP=121/79.  P=79.

## 2012-06-25 ENCOUNTER — Other Ambulatory Visit: Payer: Medicare Other

## 2012-06-25 NOTE — Telephone Encounter (Signed)
Weight=238#.

## 2012-06-25 NOTE — Telephone Encounter (Signed)
Follow up ° ° °Pt returning your call °

## 2012-06-25 NOTE — Telephone Encounter (Signed)
Slight edema and slight sob today per Mrs Oakley.

## 2012-06-27 ENCOUNTER — Ambulatory Visit (INDEPENDENT_AMBULATORY_CARE_PROVIDER_SITE_OTHER): Payer: Medicare Other | Admitting: Internal Medicine

## 2012-06-27 ENCOUNTER — Ambulatory Visit: Payer: Medicare Other | Admitting: Cardiology

## 2012-06-27 DIAGNOSIS — Z7901 Long term (current) use of anticoagulants: Secondary | ICD-10-CM

## 2012-06-27 DIAGNOSIS — I4891 Unspecified atrial fibrillation: Secondary | ICD-10-CM

## 2012-06-27 NOTE — Telephone Encounter (Signed)
Weight=238#.  He states his edema is very good and sob is slight.

## 2012-06-28 ENCOUNTER — Ambulatory Visit (INDEPENDENT_AMBULATORY_CARE_PROVIDER_SITE_OTHER): Payer: Medicare Other | Admitting: Cardiology

## 2012-06-28 ENCOUNTER — Encounter: Payer: Self-pay | Admitting: Cardiology

## 2012-06-28 VITALS — BP 138/70 | HR 68 | Ht 72.0 in | Wt 247.0 lb

## 2012-06-28 DIAGNOSIS — N183 Chronic kidney disease, stage 3 unspecified: Secondary | ICD-10-CM

## 2012-06-28 DIAGNOSIS — I2510 Atherosclerotic heart disease of native coronary artery without angina pectoris: Secondary | ICD-10-CM

## 2012-06-28 DIAGNOSIS — I714 Abdominal aortic aneurysm, without rupture, unspecified: Secondary | ICD-10-CM

## 2012-06-28 DIAGNOSIS — M109 Gout, unspecified: Secondary | ICD-10-CM

## 2012-06-28 DIAGNOSIS — K439 Ventral hernia without obstruction or gangrene: Secondary | ICD-10-CM

## 2012-06-28 DIAGNOSIS — Z7901 Long term (current) use of anticoagulants: Secondary | ICD-10-CM

## 2012-06-28 DIAGNOSIS — M545 Low back pain, unspecified: Secondary | ICD-10-CM

## 2012-06-28 DIAGNOSIS — I10 Essential (primary) hypertension: Secondary | ICD-10-CM

## 2012-06-28 DIAGNOSIS — N492 Inflammatory disorders of scrotum: Secondary | ICD-10-CM

## 2012-06-28 DIAGNOSIS — N498 Inflammatory disorders of other specified male genital organs: Secondary | ICD-10-CM

## 2012-06-28 DIAGNOSIS — Q799 Congenital malformation of musculoskeletal system, unspecified: Secondary | ICD-10-CM

## 2012-06-28 LAB — BASIC METABOLIC PANEL
CO2: 27 mEq/L (ref 19–32)
Calcium: 8.9 mg/dL (ref 8.4–10.5)
Creatinine, Ser: 1.3 mg/dL (ref 0.4–1.5)
Glucose, Bld: 183 mg/dL — ABNORMAL HIGH (ref 70–99)
Sodium: 140 mEq/L (ref 135–145)

## 2012-06-28 NOTE — Assessment & Plan Note (Signed)
Coronary disease is stable. No further workup at this time. 

## 2012-06-28 NOTE — Assessment & Plan Note (Signed)
He continues on Coumadin. 

## 2012-06-28 NOTE — Assessment & Plan Note (Signed)
The sensation of an abnormality in this area is new for him. However I suspect that this represents his manubrium. We will follow this. No further workup at this time.

## 2012-06-28 NOTE — Assessment & Plan Note (Signed)
He is continuing to get care at home and his wife is helping with the packing.

## 2012-06-28 NOTE — Telephone Encounter (Signed)
Weight=239# today.  He was in for a visit with Dr Myrtis Ser.

## 2012-06-28 NOTE — Assessment & Plan Note (Signed)
Renal function will be checked with his recent hospitalization with antibiotics.

## 2012-06-28 NOTE — Patient Instructions (Addendum)
Your physician recommends that you return for lab work today:  (bmet)  Your physician recommends that you schedule a follow-up appointment in: 3 months.

## 2012-06-28 NOTE — Assessment & Plan Note (Signed)
He had a flare of his gout while hospitalized. This is stable now.

## 2012-06-28 NOTE — Progress Notes (Signed)
HPI  Patient is seen today to followup coronary disease and hypertension and fluid overload and arrhythmias. In addition he recently had a scrotal abscess that led to cellulitis. He was actually hospitalized for one week. He is almost completely improved but still has some packing of this area being done. He did not have any chest pain or heart failure with this.  His blood pressure home as been stable. He's not having any significant chest pain or shortness of breath. His back is doing better since he has the stimulator placed.  The patient mentions that he feels an area below his sternum that he thinks maybe an abnormality. In addition he mentions that he thinks he has a area of swelling in his abdomen.  Allergies  Allergen Reactions  . Avelox (Moxifloxacin Hcl In Nacl) Swelling, Rash and Other (See Comments)    Patient became hypotensive after infusion started  . Penicillins     REACTION: anaphylaxis    Current Outpatient Prescriptions  Medication Sig Dispense Refill  . allopurinol (ZYLOPRIM) 100 MG tablet Take 50 mg by mouth daily.      Marland Kitchen amLODipine (NORVASC) 5 MG tablet Take 1 tablet (5 mg total) by mouth 2 (two) times daily.  180 tablet  3  . aspirin 81 MG tablet Take 81 mg by mouth once a week. sunday      . atorvastatin (LIPITOR) 10 MG tablet Take 1 tablet (10 mg total) by mouth daily.  90 tablet  0  . carvedilol (COREG) 25 MG tablet Take 12.5 mg by mouth 2 (two) times daily with a meal.       . cholecalciferol (VITAMIN D) 1000 UNITS tablet Take 1,000 Units by mouth daily.      . colchicine 0.6 MG tablet Take 1 tablet (0.6 mg total) by mouth 2 (two) times daily.  10 tablet  0  . furosemide (LASIX) 80 MG tablet Take 1 tablet (80 mg total) by mouth 2 (two) times daily.      Marland Kitchen gabapentin (NEURONTIN) 100 MG capsule Take 100 mg by mouth 2 (two) times daily.      Marland Kitchen glimepiride (AMARYL) 2 MG tablet Take 1 mg by mouth daily before breakfast.       . ipratropium (ATROVENT) 0.06 % nasal  spray Place 1 spray into the nose daily.       . nitroGLYCERIN (NITROSTAT) 0.4 MG SL tablet Place 0.4 mg under the tongue every 5 (five) minutes as needed.        . potassium chloride SA (K-DUR,KLOR-CON) 20 MEQ tablet Take 1 tablet (20 mEq total) by mouth 2 (two) times daily.  30 tablet    . Thiamine HCl (B-1 PO) Take 250 mcg by mouth daily. Take 1/2 daily      . warfarin (COUMADIN) 2.5 MG tablet Take 2.5-5 mg by mouth every evening. Pt taking 2.5 mg every day except 5 mg on wed      . ACCU-CHEK COMFORT CURVE test strip       . ACCU-CHEK SOFTCLIX LANCETS lancets        No current facility-administered medications for this visit.    History   Social History  . Marital Status: Married    Spouse Name: N/A    Number of Children: N/A  . Years of Education: N/A   Occupational History  . Not on file.   Social History Main Topics  . Smoking status: Former Smoker    Quit date: 03/22/1995  . Smokeless tobacco:  Former Neurosurgeon  . Alcohol Use: 1.2 oz/week    2 Cans of beer per week     Comment: beer  . Drug Use: No  . Sexually Active: No   Other Topics Concern  . Not on file   Social History Narrative  . No narrative on file    Family History  Problem Relation Age of Onset  . Hypertension Mother   . Stroke Mother   . Diabetes Father   . Coronary artery disease Father   . Other Father     DVT    Past Medical History  Diagnosis Date  . Diabetes mellitus   . Hyperlipidemia   . Hypertension   . Pilonidal cyst   . Atrial fibrillation     Previous long-term amiodarone therapy with multiple cardioversions / amiodarone stopped September, 2009  . Atrial flutter     Started November, 2010, Left-sided and cannot ablate  . Left atrial thrombus     Remote past... cardioversions done since that time  . Wide-complex tachycardia   . Left ventricular ejection fraction less than 40%   . Gout   . AAA (abdominal aortic aneurysm)     Surgical repair  . Discolored skin   . S/P ICD  (internal cardiac defibrillator) procedure     Dr. Ladona Ridgel 2009... by the pacing  . SOB (shortness of breath)     Large left effusion/ thoracentesis/hospitalization/November, 2011... exudated.. cytology negative.. Dr.Wert.. no proof of mesothelioma  . Pericardial effusion   . Pleural effusion     Large loculated effusion on the left side November, 2011. This was tapped. It was exudative. Cytology revealed no cancer no proof of mesothelioma area pulmonary team felt that no further workup was needed  . S/P AAA repair   . Spinal stenosis     Surgery Dr.Elsner  . CAD (coronary artery disease)     Catheterization July, 2008... name and vein grafts patent but low cardiac output  . Warfarin anticoagulation   . Cardiomyopathy     Ischemic... ICD  . CHF (congestive heart failure)     EF 30-40%... echo.. November, 2008  /  EF 55-60% echo... November, 2011  . Venous insufficiency     Toe discoloration chronic  . Mitral regurgitation     Mild echo  . Aortic valve sclerosis   . Nasal drainage     Chronic  . Alcohol ingestion of more than four drinks per week     Excess beer  not a dependency problem  . Chronotropic incompetence     IV pacing rate adjusted  . Thrombophlebitis of superficial veins of upper extremities     Possible venous stenosis from defibrillator  . Pericardial effusion     November, 2011 .. decreased during hospitalization  . Eye abnormality     Ophthalmologist questions a clot in one of his eyes, May, 2012  . Overweight     November, 2012  . Pleural thickening   . Ejection fraction < 50%     Ejection fraction has varied over time from 35-50%.,, Echoes are technically very difficult,,, EF 50%, echo, May 25, 2011, technically very difficult  . Drug therapy     Redness and swelling with Avelox infusion May 24, 2011  . COPD (chronic obstructive pulmonary disease)   . Myocardial infarction   . Spinal cord stimulator status     October, 2013  . Carotid artery disease      Doppler, December, 2013, 0-39% bilateral  . Pacemaker   .  Pneumonia   . Arthritis   . ICD (implantable cardiac defibrillator) in place     Past Surgical History  Procedure Laterality Date  . Colonoscopy w/ polypectomy    . Icd       insertion  . Lumbar fusion    . Pilonidal cyst removal    . Surgery scrotal / testicular    . Coronary artery bypass graft  2004  . Abdominal aortic aneurysm repair  11/2002  . Insert / replace / remove pacemaker    . Back surgery    . Incision and drainage abscess / hematoma of bursa / knee / thigh      Patient Active Problem List  Diagnosis  . DIABETES MELLITUS, CONTROLLED  . GOUT  . PERIPHERAL NEUROPATHY  . DVT  . DIVERTICULOSIS, COLON  . CKD (chronic kidney disease), stage III  . 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 BACK PAIN, LUMBAR  . CORONARY ARTERY BYPASS GRAFT, HX OF  . Hyperlipidemia  . Hypertension  . Atrial flutter  . Left atrial thrombus  . AAA (abdominal aortic aneurysm)  . S/P ICD (internal cardiac defibrillator) procedure  . Warfarin anticoagulation  . Paroxysmal ventricular tachycardia  . Shortness of breath  . Atrial fibrillation  . Wide-complex tachycardia  . Spinal stenosis  . CAD (coronary artery disease)  . Cardiomyopathy  . CHF (congestive heart failure)  . Venous insufficiency  . Mitral regurgitation  . Aortic valve sclerosis  . Alcohol ingestion of more than four drinks per week  . Chronotropic incompetence  . Thrombophlebitis of superficial veins of upper extremities  . Pericardial effusion  . Overweight  . Pleural thickening  . Pleural effusion  . Ejection fraction < 50%  . Drug therapy  . Encounter for long-term (current) use of anticoagulants  . Renal insufficiency  . Edema  . Peripheral vascular disease with claudication  . Spinal cord stimulator status  . Carotid artery disease  . Cellulitis of scrotum    ROS   Patient denies fever, chills, headache, sweats, rash, change in  vision, change in hearing, chest pain, cough, nausea vomiting, urinary symptoms. All other systems are reviewed and are negative.    PHYSICAL EXAM  Patient is oriented to person time and place. Affect is normal. He is here with his wife. There is no jugulovenous distention. Lungs are clear. Respiratory effort is nonlabored. Cardiac exam reveals S1 and S2. There no clicks or significant murmurs. The abdomen is soft. When he does a Valsalva he does have a large ventral hernia. It is stable. There is no risk of bowel entrapment. In addition the area that he is concerned about that his bony is probably his manubrium slightly offset to the right. This will be followed. He has no significant peripheral edema today. There no skin rashes.  Filed Vitals:   06/28/12 1417  BP: 138/70  Pulse: 68  Height: 6' (1.829 m)  Weight: 247 lb (112.038 kg)     ASSESSMENT & PLAN

## 2012-06-28 NOTE — Assessment & Plan Note (Signed)
Blood pressures control. No change in therapy. 

## 2012-06-28 NOTE — Assessment & Plan Note (Signed)
The patient had his abdominal aortic aneurysm repaired in the past. When I see him next we will re\re reviewing we should do a followup ultrasound.

## 2012-06-28 NOTE — Assessment & Plan Note (Signed)
This is a new finding today although I do not believe that it has occurred recently.  It is not causing any problems. We'll follow this over time.

## 2012-06-28 NOTE — Assessment & Plan Note (Signed)
His back pain is greatly improved with the stimulator that he has.

## 2012-06-29 ENCOUNTER — Telehealth: Payer: Self-pay | Admitting: Cardiology

## 2012-06-29 NOTE — Telephone Encounter (Signed)
New problem   Pt's wife need bmet results from yesterdays visit and she need to talk to nurse about pt's bp readings. Please call pt

## 2012-06-29 NOTE — Telephone Encounter (Signed)
**Note De-Identified [name] Obfuscation** Weight= 240 lbs. Pts wife states that the pt. has slight swelling in his feet and his sob is the same.

## 2012-07-04 NOTE — Telephone Encounter (Signed)
No answer, LMTCB. ?

## 2012-07-05 NOTE — Telephone Encounter (Signed)
Pts wife states that pts weight was 239 lbs yesterday (07/04/12) with slight edema and sob. Pt took Furosemide 80 mg bid.

## 2012-07-05 NOTE — Telephone Encounter (Signed)
**Note De-Identified [name] Obfuscation** Pts wife states that the pts weight is 240 lbs today. Edema and sob is unchanged. Pt will take Furosemide 80 mg bid.

## 2012-07-06 NOTE — Telephone Encounter (Signed)
LMTCB

## 2012-07-06 NOTE — Telephone Encounter (Signed)
**Note De-Identified [name] Obfuscation** Pts wife, Yarden, states that pts weight today is 241 lbs and that his edema and sob is unchanged.

## 2012-07-09 ENCOUNTER — Telehealth: Payer: Self-pay | Admitting: Internal Medicine

## 2012-07-09 NOTE — Telephone Encounter (Signed)
Patient states that he is on vacation and feels like he is having a gout flare up. He says that he currently takes half a pill of allopurinol but wants to know if its okay to take a whole pill.

## 2012-07-09 NOTE — Telephone Encounter (Signed)
Increase allopurinol may exacerbate the gout. Use the colchicine every 12 hours until the gout resolves or diarrhea or nausea appear.

## 2012-07-09 NOTE — Telephone Encounter (Signed)
Hopp please advise  

## 2012-07-10 NOTE — Telephone Encounter (Signed)
Discuss with patient and his wife.

## 2012-07-11 ENCOUNTER — Telehealth: Payer: Self-pay

## 2012-07-11 NOTE — Telephone Encounter (Signed)
Weight= 242 lbs  Pts wife, [name], states that the pts edema and sob is unchanged. Pt will take Furosemide 80 mg bid today.

## 2012-07-12 NOTE — Telephone Encounter (Signed)
**Note De-Identified [name] Obfuscation** Weight= 241 lbs. Pts wife, [name], states that the pts edema and sob is unchanged. Pt will take Lasix 80 mg bid today.

## 2012-07-13 NOTE — Telephone Encounter (Signed)
Weight= 239 lbs. Pts wife, Yale, states that the pt has no sob and slight edema. Pt will take Lasix 80 mg bid today.

## 2012-07-15 ENCOUNTER — Other Ambulatory Visit: Payer: Self-pay | Admitting: Internal Medicine

## 2012-07-17 ENCOUNTER — Encounter: Payer: Self-pay | Admitting: *Deleted

## 2012-07-18 ENCOUNTER — Encounter: Payer: Self-pay | Admitting: Internal Medicine

## 2012-07-18 NOTE — Telephone Encounter (Signed)
Weight= 239 lbs  Edema and sob are unchanged.

## 2012-07-20 ENCOUNTER — Telehealth: Payer: Self-pay

## 2012-07-20 NOTE — Telephone Encounter (Signed)
07/19/12 Weight= 241 lbs.   Pt states that he has slight edema in his right foot and sob is unchanged. Pt will take 80 mg of Furosemide BID today.  07/20/12 Weight= 241 lbs.    Pt states that his edema and sob is unchanged. Pt will take 80 mg of Furosemide BID today.

## 2012-07-23 NOTE — Telephone Encounter (Signed)
**Note De-identified Coden Franchi Obfuscation** LMTCB

## 2012-07-23 NOTE — Telephone Encounter (Signed)
**Note De-Identified Herberth Deharo Obfuscation** Weight= 240 lbs.  Pts wife, Darek, states that the pt has some edema in his right leg and no SOB.

## 2012-07-24 ENCOUNTER — Encounter (INDEPENDENT_AMBULATORY_CARE_PROVIDER_SITE_OTHER): Payer: Self-pay | Admitting: Surgery

## 2012-07-24 ENCOUNTER — Ambulatory Visit (INDEPENDENT_AMBULATORY_CARE_PROVIDER_SITE_OTHER): Payer: Medicare Other | Admitting: Surgery

## 2012-07-24 VITALS — BP 146/80 | HR 84 | Temp 97.5°F | Resp 16 | Ht 72.0 in | Wt 246.0 lb

## 2012-07-24 DIAGNOSIS — N498 Inflammatory disorders of other specified male genital organs: Secondary | ICD-10-CM

## 2012-07-24 DIAGNOSIS — N492 Inflammatory disorders of scrotum: Secondary | ICD-10-CM

## 2012-07-24 NOTE — Progress Notes (Signed)
Subjective:     Patient ID: [name], male   DOB: [birth year], 72 y.o.   MRN: [id number]  HPI He is here for another visit regarding his scrotal abscess. He reports it has now healed completely  Review of Systems     Objective:   Physical Exam On exam, the wound is completely healed scrotum    Assessment:     Healed scrotal abscess     Plan:     I will see him back as needed

## 2012-07-25 NOTE — Telephone Encounter (Signed)
**Note De-Identified [name] Obfuscation** Weight= 238 lbs. Pts wife, Kert, states that the pt has slight edema in legs and no SOB.

## 2012-07-26 NOTE — Telephone Encounter (Signed)
Weight= 238 lbs. Pts wife, Sears, states that pt has no sob and edema is unchanged. Pt took Lasix 80 mg bid today.

## 2012-07-30 ENCOUNTER — Ambulatory Visit (INDEPENDENT_AMBULATORY_CARE_PROVIDER_SITE_OTHER): Payer: Medicare Other | Admitting: Pharmacist

## 2012-07-30 DIAGNOSIS — Z7901 Long term (current) use of anticoagulants: Secondary | ICD-10-CM

## 2012-07-30 DIAGNOSIS — I4891 Unspecified atrial fibrillation: Secondary | ICD-10-CM

## 2012-08-01 NOTE — Telephone Encounter (Signed)
Weight= 240 lbs.  SOB and edema unchanged. Pt will take 80 mg of Lasix BID today.

## 2012-08-03 NOTE — Telephone Encounter (Signed)
Weight= 240 lbs.  Pts wife states that pt has edema in right leg but thinks it is due to a past break in that leg and no sob. Pt will take Lasix 80 mg bid today.

## 2012-08-06 ENCOUNTER — Telehealth: Payer: Self-pay | Admitting: Internal Medicine

## 2012-08-06 ENCOUNTER — Other Ambulatory Visit (INDEPENDENT_AMBULATORY_CARE_PROVIDER_SITE_OTHER): Payer: Medicare Other

## 2012-08-06 DIAGNOSIS — T887XXA Unspecified adverse effect of drug or medicament, initial encounter: Secondary | ICD-10-CM

## 2012-08-06 DIAGNOSIS — E119 Type 2 diabetes mellitus without complications: Secondary | ICD-10-CM

## 2012-08-06 DIAGNOSIS — E785 Hyperlipidemia, unspecified: Secondary | ICD-10-CM

## 2012-08-06 LAB — HEPATIC FUNCTION PANEL
Albumin: 3.5 g/dL (ref 3.5–5.2)
Alkaline Phosphatase: 66 U/L (ref 39–117)

## 2012-08-06 LAB — LIPID PANEL
LDL Cholesterol: 78 mg/dL (ref 0–99)
Total CHOL/HDL Ratio: 3
Triglycerides: 102 mg/dL (ref 0.0–149.0)

## 2012-08-06 LAB — CK: Total CK: 43 U/L (ref 7–232)

## 2012-08-06 NOTE — Telephone Encounter (Signed)
Patient's wife states the patient was supposed to have A1c checked, but does not see any results for this on MyChart. Please advise if pt needs to come back in for A1c.

## 2012-08-06 NOTE — Telephone Encounter (Signed)
**Note De-Identified Bronda Alfred Obfuscation** Weight= 240 lbs  Pts wife, Dunbar, states that the edema in pts right leg has improved and he has no sob. Pt will take Lasix 80 mg bid today.

## 2012-08-07 ENCOUNTER — Other Ambulatory Visit: Payer: Medicare Other

## 2012-08-07 ENCOUNTER — Other Ambulatory Visit: Payer: Self-pay | Admitting: Internal Medicine

## 2012-08-07 DIAGNOSIS — E119 Type 2 diabetes mellitus without complications: Secondary | ICD-10-CM

## 2012-08-07 LAB — HEMOGLOBIN A1C: Hgb A1c MFr Bld: 7.4 % — ABNORMAL HIGH (ref 4.6–6.5)

## 2012-08-07 NOTE — Telephone Encounter (Signed)
**Note De-Identified Bryar Dahms Obfuscation** Weight= 240 lbs.   Pts wife, Riven, states that there has been no change in SOB and edema. Pt will take 80 mg of Lasix BID today.

## 2012-08-07 NOTE — Addendum Note (Signed)
Addended by: Silvio Pate D on: 08/07/2012 10:24 AM   Modules accepted: Orders

## 2012-08-07 NOTE — Telephone Encounter (Signed)
Labs ordered and pt notified

## 2012-08-07 NOTE — Addendum Note (Signed)
Addended by: Silvio Pate D on: 08/07/2012 10:23 AM   Modules accepted: Orders

## 2012-08-10 NOTE — Telephone Encounter (Signed)
Weight= 239 lbs. Pts wife states that the pt has slight SOB and some edema in right leg. Pt will take 80 mg of Lasix today.

## 2012-08-14 NOTE — Telephone Encounter (Signed)
Pts wife states that they forgot to call in pts weight today and they are not at home at this time. She states that they wrote the weight down and when I contact them for tomorrows weight they will give me today's weight at that time.

## 2012-08-15 NOTE — Telephone Encounter (Signed)
08/14/12   Weight= 243 lbs.  Pt c/o edema in right leg and some sob. Pt took Lasix 80 mg bid yesterday.

## 2012-08-15 NOTE — Telephone Encounter (Signed)
**Note De-Identified [name] Obfuscation** Weight= 241 lbs.  Pt c/o increased edema in right leg but very little in the left leg and no change in sob. Pt will take Lasix 80 mg bid today.

## 2012-08-17 NOTE — Telephone Encounter (Signed)
**Note De-Identified [name] Obfuscation** Weight= 243 lbs. Pts wife states that the pts right leg remains swollen and his SOB is unchanged. Pt will take 80 mg of Lasix BID today

## 2012-08-21 ENCOUNTER — Telehealth: Payer: Self-pay

## 2012-08-21 NOTE — Telephone Encounter (Signed)
**Note De-Identified [name] Obfuscation** Weight= 241 lbs.  Pts wife, Mercury, states that the pt has very little SOB but the edema in his left leg is worse. The pt does not have edema anywhere else per wife. Will discuss with Dr. Myrtis Ser.

## 2012-08-23 NOTE — Telephone Encounter (Signed)
Weight= 242 lbs.  Pt reports no change in SOB and c/o swelling in right leg. Pt states that he wears his compression stockings during the night while he sleeps only and never during the daytime. Pt is advised to try wearing stockings during the daytime and remove at QHS and to sleep with his legs and feet elevated to see if edema improves, he verbalized understanding and agrees with plan.

## 2012-08-24 NOTE — Telephone Encounter (Signed)
Weight= 238 lbs.  Pts wife, Rashun, states that the edema in pts right leg has improved but no change in his SOB. Pt did not sleep in compression stockings last night and has been wearing all day today with legs elevated, pts wife states that they feel this is helping with his edema.

## 2012-08-29 ENCOUNTER — Other Ambulatory Visit: Payer: Self-pay | Admitting: Internal Medicine

## 2012-08-29 NOTE — Telephone Encounter (Signed)
**Note De-Identified Arnulfo Batson Obfuscation** Weight= 240 lbs.  Pts wife, Kellis, states that the pt has increased edema in right leg and no change in SOB. She states the pt is having a lot of pain in his left foot that maybe due to pts gout and that he is scheduled to see his PCP next week.

## 2012-08-30 NOTE — Telephone Encounter (Signed)
Weight= 240 lbs. pts wife, [name], states that the pts edema is unchanged and SOB has improved.

## 2012-08-31 ENCOUNTER — Encounter: Payer: Self-pay | Admitting: Internal Medicine

## 2012-08-31 ENCOUNTER — Ambulatory Visit (INDEPENDENT_AMBULATORY_CARE_PROVIDER_SITE_OTHER): Payer: Medicare Other | Admitting: Internal Medicine

## 2012-08-31 VITALS — BP 118/76 | HR 104 | Temp 97.6°F | Wt 247.0 lb

## 2012-08-31 DIAGNOSIS — M109 Gout, unspecified: Secondary | ICD-10-CM

## 2012-08-31 DIAGNOSIS — E119 Type 2 diabetes mellitus without complications: Secondary | ICD-10-CM

## 2012-08-31 DIAGNOSIS — R609 Edema, unspecified: Secondary | ICD-10-CM

## 2012-08-31 MED ORDER — ACCU-CHEK SOFTCLIX LANCETS MISC: Status: DC

## 2012-08-31 MED ORDER — GLUCOSE BLOOD VI STRP: ORAL_STRIP | Status: DC

## 2012-08-31 MED ORDER — ALLOPURINOL 100 MG PO TABS: 100.0000 mg | ORAL_TABLET | Freq: Every day | ORAL | Status: DC

## 2012-08-31 MED ORDER — SAXAGLIPTIN-METFORMIN ER 5-500 MG PO TB24: 5.0000 mg | ORAL_TABLET | ORAL | Status: DC

## 2012-08-31 NOTE — Telephone Encounter (Signed)
**Note De-Identified [name] Obfuscation** Weight= 239 lbs. Pts wife states that the pt has no sob but he has edema in his right leg and pain and edema in left foot that the pt and his wife feels is gout. Pt has appt with his PCP at 4:00 today.

## 2012-08-31 NOTE — Progress Notes (Signed)
  Subjective:    Patient ID: [name], male    DOB: 10/19/1940, 72 y.o.   MRN: [id number]  HPI  He developed dramatic swelling of the right lower extremity 08/26/12; this improved with elevation in the recliner and application of  support hose.  He's also had some swelling in the left leg as well but to much less degree  He developed knots along the lateral aspect of the left foot and pain and swelling in the knees.  He started on colchicine twice a day 6/8 which helped the pain to some degree.  He has erythema in the right lower extremity below the knee which concerned them for cellulitis which has been a recurrent problem    Review of Systems   He is on allopurinol 100 mg only one half pill daily because of prior renal insufficiency. His most recent creatinine was 1.3 in April of this year He has not been having fever, chills, or sweats.  His most recent A1c was 7.4% on 08/07/12. He is on Glimiperide 2 mg 1/2 daily. His fasting blood sugars have averaged  156 . Two hr pc glucose usually < 180, but max < 250.    Objective:   Physical Exam  Gen.: adequately nourished in appearance. Alert, appropriate and cooperative throughout exam.  Eyes: No corneal or conjunctival inflammation noted.  Neck: No deformities, masses, or tenderness noted.  Thyroid normal. Lungs: Normal respiratory effort; chest expands symmetrically. Lungs are clear to auscultation without rales, wheezes, or increased work of breathing. Heart: Normal rate (86) and regular rhythm. Normal S1 and S2. No gallop, click, or rub. S4 w/o murmur. Abdomen: Bowel sounds normal; abdomen protuberant but  soft and nontender. No masses, organomegaly or hernias noted.  Musculoskeletal/extremities:No clubbing or cyanosis. 2+ edema RLE & 1+ LLE.Fusiform knees.Able to lie down & sit up w/o help.  Vascular: Carotid & radial artery pulses are equal .Decreased  dorsalis pedis and  posterior tibial. Neurologic: Alert and oriented x3.    Skin: Plethora of the shins, right greater than left. No definite cellulitis present. Lymph: No cervical, axillary lymphadenopathy present. Psych: Mood and affect are normal. Normally interactive                                                                                        Assessment & Plan:  #1 diffuse lower extremity pain; most likely gout flare. Colchicine should be continued. Allopurinol will be increased to 100 mg daily as his renal function has improved  #2 diabetic goal would be to 8% or less. Sulfonylurea area should be discontinued because of potential cardiac risk and risk of hypoglycemia and weight gain. Best combination option on his formulary will be pursued. Renal function will need to be monitored.

## 2012-08-31 NOTE — Patient Instructions (Addendum)
Appt in 4 weeks. Stop Glimiperide.Increase Allopurinol to 1 pill daily. PT/INR in 1 week because of med changes.

## 2012-09-03 ENCOUNTER — Ambulatory Visit (INDEPENDENT_AMBULATORY_CARE_PROVIDER_SITE_OTHER): Payer: Medicare Other | Admitting: *Deleted

## 2012-09-03 DIAGNOSIS — I4891 Unspecified atrial fibrillation: Secondary | ICD-10-CM

## 2012-09-03 DIAGNOSIS — Z7901 Long term (current) use of anticoagulants: Secondary | ICD-10-CM

## 2012-09-04 ENCOUNTER — Telehealth: Payer: Self-pay

## 2012-09-04 NOTE — Telephone Encounter (Signed)
CHF call:  Weight= 239 lbs. Pts wife states that the pt has no sob and edema is improving. Pt went to PCP on Friday (6/13) due to swelling and pain in his right leg and left foot and was advised that he does have gout. Pts wife states that the pts Allopurinol was increased to 100 mg daily and his diabetic meds were adjusted as well.

## 2012-09-05 NOTE — Telephone Encounter (Signed)
Weight= 240 lbs. Pts wife states that the pts edema and sob is unchanged and that he is feeling better since seeing PCP on Friday for gout.

## 2012-09-05 NOTE — Telephone Encounter (Signed)
F/u ° ° °Pt returning your call °

## 2012-09-07 NOTE — Telephone Encounter (Signed)
Weight= 238 lbs.  Pts wife, Burnell, states that the pts edema and SOB is unchanged. Pt will be out of town on Monday 6/23 and will return on Tuesday 6/24.

## 2012-09-11 ENCOUNTER — Encounter: Payer: Self-pay | Admitting: Internal Medicine

## 2012-09-11 ENCOUNTER — Ambulatory Visit (INDEPENDENT_AMBULATORY_CARE_PROVIDER_SITE_OTHER): Payer: Medicare Other | Admitting: *Deleted

## 2012-09-11 ENCOUNTER — Ambulatory Visit (INDEPENDENT_AMBULATORY_CARE_PROVIDER_SITE_OTHER): Payer: Medicare Other | Admitting: Internal Medicine

## 2012-09-11 VITALS — BP 148/80 | HR 82 | Ht 72.0 in | Wt 249.8 lb

## 2012-09-11 DIAGNOSIS — I472 Ventricular tachycardia: Secondary | ICD-10-CM

## 2012-09-11 DIAGNOSIS — I428 Other cardiomyopathies: Secondary | ICD-10-CM

## 2012-09-11 DIAGNOSIS — I2510 Atherosclerotic heart disease of native coronary artery without angina pectoris: Secondary | ICD-10-CM

## 2012-09-11 DIAGNOSIS — Z95810 Presence of automatic (implantable) cardiac defibrillator: Secondary | ICD-10-CM

## 2012-09-11 DIAGNOSIS — I4891 Unspecified atrial fibrillation: Secondary | ICD-10-CM

## 2012-09-11 DIAGNOSIS — Z7901 Long term (current) use of anticoagulants: Secondary | ICD-10-CM

## 2012-09-11 DIAGNOSIS — I429 Cardiomyopathy, unspecified: Secondary | ICD-10-CM

## 2012-09-11 LAB — POCT INR: INR: 2

## 2012-09-11 NOTE — Telephone Encounter (Signed)
Weight= 242 lbs.  Pt came by office while having Coumadin check today to show Dr Myrtis Ser his right leg which is swollen and red at this time. Pt advised by Dr Argentina Ponder to limit salt and fluid intake and elevate his legs as often as possible, the pt verbalized understanding.

## 2012-09-11 NOTE — Progress Notes (Signed)
HPI [name] returns today for ICD followup. He is a pleasant 72 yo man with an ICM, chronic systolic heart failure, s/p ICD implant. He has chronic atrial fibrillation.   The patient has a chronically elevated left ventricular pacing threshold. He has a history of gout, and is bothered with his diet is high in protein. He has mild peripheral edema. No ICD shock. No syncope. Allergies  Allergen Reactions  . Avelox (Moxifloxacin Hcl In Nacl) Swelling, Rash and Other (See Comments)    Patient became hypotensive after infusion started  . Penicillins     REACTION: anaphylaxis     Current Outpatient Prescriptions  Medication Sig Dispense Refill  . ACCU-CHEK SOFTCLIX LANCETS lancets DX: 250.00, check blood sugar daily as directed  100 each  3  . allopurinol (ZYLOPRIM) 100 MG tablet Take 1 tablet (100 mg total) by mouth daily.  30 tablet  5  . amLODipine (NORVASC) 5 MG tablet Take 1 tablet (5 mg total) by mouth 2 (two) times daily.  180 tablet  3  . aspirin 81 MG tablet Take 81 mg by mouth once a week. sunday      . atorvastatin (LIPITOR) 10 MG tablet TAKE ONE TABLET BY MOUTH EVERY DAY  90 tablet  1  . carvedilol (COREG) 25 MG tablet Take 12.5 mg by mouth 2 (two) times daily with a meal.       . cholecalciferol (VITAMIN D) 1000 UNITS tablet Take 1,000 Units by mouth daily.      . colchicine 0.6 MG tablet Take 0.6 mg by mouth 2 (two) times daily as needed.      . furosemide (LASIX) 80 MG tablet Take 1 tablet (80 mg total) by mouth 2 (two) times daily.      Marland Kitchen glimepiride (AMARYL) 2 MG tablet Take 1 mg by mouth daily before breakfast.       . glucose blood (ACCU-CHEK COMFORT CURVE) test strip Check blood sugar once daily DX: 250.00  100 each  3  . ipratropium (ATROVENT) 0.06 % nasal spray Place 1 spray into the nose daily.       . nitroGLYCERIN (NITROSTAT) 0.4 MG SL tablet Place 0.4 mg under the tongue every 5 (five) minutes as needed.        . potassium chloride SA (K-DUR,KLOR-CON) 20 MEQ tablet  Take 1 tablet (20 mEq total) by mouth 2 (two) times daily.  30 tablet    . Saxagliptin-Metformin (KOMBIGLYZE XR) 5-500 MG TB24 Take 5-500 mg by mouth daily after supper.  28 tablet  0  . warfarin (COUMADIN) 2.5 MG tablet Take 2.5-5 mg by mouth every evening. Pt taking 2.5 mg every day except 5 mg on wed       No current facility-administered medications for this visit.     Past Medical History  Diagnosis Date  . Diabetes mellitus   . Hyperlipidemia   . Hypertension   . Pilonidal cyst   . Atrial fibrillation     Previous long-term amiodarone therapy with multiple cardioversions / amiodarone stopped September, 2009  . Atrial flutter     Started November, 2010, Left-sided and cannot ablate  . Left atrial thrombus     Remote past... cardioversions done since that time  . Wide-complex tachycardia   . Left ventricular ejection fraction less than 40%   . Gout   . AAA (abdominal aortic aneurysm)     Surgical repair  . Discolored skin   . S/P ICD (internal cardiac defibrillator) procedure  Dr. Ladona Ridgel 2009... by the pacing  . SOB (shortness of breath)     Large left effusion/ thoracentesis/hospitalization/November, 2011... exudated.. cytology negative.. Dr.Wert.. no proof of mesothelioma  . Pericardial effusion   . Pleural effusion     Large loculated effusion on the left side November, 2011. This was tapped. It was exudative. Cytology revealed no cancer no proof of mesothelioma area pulmonary team felt that no further workup was needed  . S/P AAA repair   . Spinal stenosis     Surgery Dr.Elsner  . CAD (coronary artery disease)     Catheterization July, 2008... name and vein grafts patent but low cardiac output  . Warfarin anticoagulation   . Cardiomyopathy     Ischemic... ICD  . CHF (congestive heart failure)     EF 30-40%... echo.. November, 2008  /  EF 55-60% echo... November, 2011  . Venous insufficiency     Toe discoloration chronic  . Mitral regurgitation     Mild echo   . Aortic valve sclerosis   . Nasal drainage     Chronic  . Alcohol ingestion of more than four drinks per week     Excess beer  not a dependency problem  . Chronotropic incompetence     IV pacing rate adjusted  . Thrombophlebitis of superficial veins of upper extremities     Possible venous stenosis from defibrillator  . Pericardial effusion     November, 2011 .. decreased during hospitalization  . Eye abnormality     Ophthalmologist questions a clot in one of his eyes, May, 2012  . Overweight(278.02)     November, 2012  . Pleural thickening   . Ejection fraction < 50%     Ejection fraction has varied over time from 35-50%.,, Echoes are technically very difficult,,, EF 50%, echo, May 25, 2011, technically very difficult  . Drug therapy     Redness and swelling with Avelox infusion May 24, 2011  . COPD (chronic obstructive pulmonary disease)   . Myocardial infarction   . Spinal cord stimulator status     October, 2013  . Carotid artery disease     Doppler, December, 2013, 0-39% bilateral  . Pacemaker   . Pneumonia   . Arthritis   . ICD (implantable cardiac defibrillator) in place   . Ventral hernia     April, 2014, result of his abdominal surgery  . Bony abnormality     Patient's manubrium is slightly displaced to the right    ROS:   All systems reviewed and negative except as noted in the HPI.   Past Surgical History  Procedure Laterality Date  . Colonoscopy w/ polypectomy    . Icd       insertion  . Lumbar fusion    . Pilonidal cyst removal    . Surgery scrotal / testicular    . Coronary artery bypass graft  2004  . Abdominal aortic aneurysm repair  11/2002  . Insert / replace / remove pacemaker    . Back surgery    . Incision and drainage abscess / hematoma of bursa / knee / thigh       Family History  Problem Relation Age of Onset  . Hypertension Mother   . Stroke Mother   . Diabetes Father   . Coronary artery disease Father   . Other Father      DVT     History   Social History  . Marital Status: Married    Spouse  Name: N/A    Number of Children: N/A  . Years of Education: N/A   Occupational History  . Not on file.   Social History Main Topics  . Smoking status: Former Smoker    Quit date: 03/22/1995  . Smokeless tobacco: Former Neurosurgeon  . Alcohol Use: 1.2 oz/week    2 Cans of beer per week     Comment: beer  . Drug Use: No  . Sexually Active: No   Other Topics Concern  . Not on file   Social History Narrative  . No narrative on file     BP 148/80  Pulse 82  Ht 6' (1.829 m)  Wt 249 lb 12.8 oz (113.309 kg)  BMI 33.87 kg/m2  SpO2 99%  Physical Exam:  Well appearing 72 year old man,NAD HEENT: Unremarkable Neck:  8 cm JVD, no thyromegally Lungs:  Clear except for minimal basilar rales. No wheezes, no rhonchi. HEART:  Regular rate rhythm, no murmurs, no rubs, no clicks Abd:  soft, positive bowel sounds, no organomegally, no rebound, no guarding Ext:  2 plus pulses, no edema, no cyanosis, no clubbing Skin:  No rashes no nodules Neuro:  CN II through XII intact, motor grossly intact  DEVICE  Normal device function.  See PaceArt for details.   Assess/Plan:

## 2012-09-11 NOTE — Patient Instructions (Addendum)
Your physician recommends that you schedule a follow-up appointment in: 3 weeks with Dr Ladona Ridgel

## 2012-09-12 ENCOUNTER — Encounter: Payer: Self-pay | Admitting: Internal Medicine

## 2012-09-12 ENCOUNTER — Ambulatory Visit: Payer: Medicare Other | Admitting: Internal Medicine

## 2012-09-12 ENCOUNTER — Other Ambulatory Visit: Payer: Self-pay

## 2012-09-12 LAB — ICD DEVICE OBSERVATION
AL AMPLITUDE: 0.75 mv
AL IMPEDENCE ICD: 532 Ohm
BATTERY VOLTAGE: 2.6527 V
CHARGE TIME: 10.81 s
FVT: 0
HV IMPEDENCE: 74 Ohm
RV LEAD AMPLITUDE: 10.375 mv
RV LEAD IMPEDENCE ICD: 532 Ohm
RV LEAD THRESHOLD: 0.875 V
TOT-0001: 1
TOT-0002: 0
TZAT-0001ATACH: 2
TZAT-0001ATACH: 3
TZAT-0001FASTVT: 1
TZAT-0001SLOWVT: 2
TZAT-0004FASTVT: 8
TZAT-0004SLOWVT: 8
TZAT-0004SLOWVT: 8
TZAT-0005FASTVT: 88 pct
TZAT-0005SLOWVT: 88 pct
TZAT-0011FASTVT: 10 ms
TZAT-0012ATACH: 150 ms
TZAT-0012ATACH: 150 ms
TZAT-0012SLOWVT: 200 ms
TZAT-0012SLOWVT: 200 ms
TZAT-0018ATACH: NEGATIVE
TZAT-0020ATACH: 1.5 ms
TZAT-0020SLOWVT: 1.5 ms
TZAT-0020SLOWVT: 1.5 ms
TZON-0003ATACH: 350 ms
TZON-0003SLOWVT: 340 ms
TZON-0003VSLOWVT: 450 ms
TZON-0004VSLOWVT: 20
TZST-0001ATACH: 4
TZST-0001ATACH: 6
TZST-0001FASTVT: 2
TZST-0001FASTVT: 5
TZST-0001SLOWVT: 3
TZST-0001SLOWVT: 5
TZST-0003FASTVT: 25 J
TZST-0003FASTVT: 35 J
TZST-0003FASTVT: 35 J
TZST-0003SLOWVT: 15 J

## 2012-09-12 MED ORDER — CHLORTHALIDONE 25 MG PO TABS: 12.5000 mg | ORAL_TABLET | ORAL | Status: DC

## 2012-09-12 MED ORDER — CHLORTHALIDONE 25 MG PO TABS: ORAL_TABLET | ORAL | Status: DC

## 2012-09-12 NOTE — Assessment & Plan Note (Signed)
His ventricular rate is well controlled. No change in medical therapy. 

## 2012-09-12 NOTE — Telephone Encounter (Signed)
**Note De-Identified [name] Obfuscation** Weight= 241 lbs.  Pts, wife, Leib, states that the edema in pts right leg is no better than yesterday and his sob is the same. Per Dr. Audree Camel is advised that pt should take Chlorthalidone 12.5 mg for weight greater than 238 lbs. she verbalized understanding.

## 2012-09-12 NOTE — Assessment & Plan Note (Signed)
His ICD is working normally except his left ventricular pacing threshold remains elevated. I've discussed the treatment options with the patient. I would like to turn off his left ventricular pacing to see if he actually feels a difference. He is about to go on vacation. We will have him come back after he gets back from vacation with the plan to turn off his left ventricular lead. If he cannot tell a difference in how he feels, then we would leave off. On the other hand if he clearly feels more short of breath and tired with right ventricular only pacing, then we would turn his left ventricular lead back on.

## 2012-09-12 NOTE — Assessment & Plan Note (Signed)
He denies anginal symptoms. No change in medical therapy. 

## 2012-09-17 NOTE — Telephone Encounter (Signed)
LMTCB

## 2012-09-20 ENCOUNTER — Encounter: Payer: Self-pay | Admitting: Internal Medicine

## 2012-09-24 ENCOUNTER — Telehealth: Payer: Self-pay

## 2012-09-24 ENCOUNTER — Telehealth: Payer: Self-pay | Admitting: Internal Medicine

## 2012-09-24 MED ORDER — GLUCOSE BLOOD VI STRP: ORAL_STRIP | Status: DC

## 2012-09-24 NOTE — Telephone Encounter (Signed)
**Note De-Identified [name] Obfuscation** Weight= 236 lbs.  Pt states that his right leg edema is improving and he has no SOB at this time.

## 2012-09-24 NOTE — Telephone Encounter (Signed)
Patient's spouse called stating the patient is out of test strips. He has upcoming appt on Wednesday. She states we wrote for 1x day, but he tests 3x day. Patient requesting accu check comfort test strips be sent to [facility] on [address]

## 2012-09-24 NOTE — Telephone Encounter (Signed)
**Note De-Identified Evangela Heffler Obfuscation** Weight= 236 lbs.   Pts wife, Nyal, states that the pts right leg edema is improving and that he has no SOB at this time.

## 2012-09-24 NOTE — Telephone Encounter (Signed)
RX sent

## 2012-09-24 NOTE — Telephone Encounter (Signed)
New Prob    Pts wife would like to speak to nurse. Did not leave details about call.

## 2012-09-25 NOTE — Telephone Encounter (Signed)
**Note De-Identified Don Giarrusso Obfuscation** Weight= 236 lbs.   Pts wife, [name], states that the pts edema in right leg is improved in mornings but swells a little throughout the day and he has no SOB at this time. Jermone is advised that pt should elevate legs when swelling occurs, she verbalized understanding.

## 2012-09-26 ENCOUNTER — Encounter: Payer: Self-pay | Admitting: Internal Medicine

## 2012-09-26 ENCOUNTER — Ambulatory Visit (INDEPENDENT_AMBULATORY_CARE_PROVIDER_SITE_OTHER): Payer: Medicare Other | Admitting: Internal Medicine

## 2012-09-26 VITALS — BP 118/70 | HR 74 | Temp 97.7°F | Wt 239.0 lb

## 2012-09-26 DIAGNOSIS — E119 Type 2 diabetes mellitus without complications: Secondary | ICD-10-CM

## 2012-09-26 MED ORDER — INSULIN DETEMIR 100 UNIT/ML FLEXPEN: 12.0000 [IU] | PEN_INJECTOR | Freq: Every day | SUBCUTANEOUS | Status: DC

## 2012-09-26 MED ORDER — GLUCOSE BLOOD VI STRP: ORAL_STRIP | Status: DC

## 2012-09-26 NOTE — Progress Notes (Signed)
  Subjective:    Patient ID: [name], male    DOB: Oct 19, 1940, 72 y.o.   MRN: [id number]  HPI On Kombiglyze XR 5/500 daily since 6/11;FBS have risen to 176-225. Two hr post meal 180- 382. That agent costs $325/month.  His A1c was 7.4% 08/07/2012 on a sulfonylurea. This was discontinued because of potential cardiac risk with this medicine     Review of Systems   He denies polyuria, polyphasia, or polydipsia. He has no visual changes or blurred vision, double vision, or loss of vision. He has no nonhealing lesions. The erythema noted of the shins have improved dramatically. He has chronic numbness in the right lower extremity without associated burning of the feet.     Objective:   Physical Exam Gen.:  well-nourished in appearance. Alert, appropriate and cooperative throughout exam.  Eyes: No corneal or conjunctival inflammation noted.  Lungs: Normal respiratory effort; chest expands symmetrically. Lungs are clear to auscultation without rales, wheezes, or increased work of breathing.Decreased BS Heart: Slightly irregular rate and rhythm. Normal S1 and S2. No gallop, click, or rub. Abdomen: Bowel sounds normal; abdomen soft and nontender. No masses or organomegaly . Large ventral hernia noted.                                 Musculoskeletal/extremities: No clubbing or cyanosis. Trace edema Vascular: Carotid, radial artery,  are full and equal. Decreased dorsalis pedis and  posterior tibial pulses. Neurologic: Alert and oriented x3.         Skin: Intact without suspicious lesions or rashes.Minimal shin erythema Lymph: No cervical, axillary lymphadenopathy present. Psych: Mood and affect are normal. Normally interactive                                                                                        Assessment & Plan:  See Current Assessment & Plan in Problem List under specific Diagnosis

## 2012-09-26 NOTE — Patient Instructions (Addendum)
Average your fasting sugars each week; increase the daily insulin dose by 2 units each day each week (Ex 12 units initially then 14 , 16 , etc) until the average fasting blood sugar is 140-160. At that time stop increasing the dose & repeat the A1c and urine microalbumin.  Please  schedule fasting Labs in 12 weeks after insulin started : BMET, A1c. PLEASE BRING THESE INSTRUCTIONS TO FOLLOW UP  LAB APPOINTMENT.This will guarantee correct labs are drawn, eliminating need for repeat blood sampling ( needle sticks ! ). Diagnoses /Codes:  250.70

## 2012-09-26 NOTE — Assessment & Plan Note (Signed)
The safest, most efficacious, and least expensive would be basal insulin. This will be titrated based on his average fasting blood sugars.

## 2012-09-27 ENCOUNTER — Other Ambulatory Visit: Payer: Self-pay | Admitting: *Deleted

## 2012-09-27 DIAGNOSIS — E119 Type 2 diabetes mellitus without complications: Secondary | ICD-10-CM

## 2012-09-27 MED ORDER — ACCU-CHEK SOFTCLIX LANCETS MISC: Status: DC

## 2012-09-27 MED ORDER — GLUCOSE BLOOD VI STRP: ORAL_STRIP | Status: DC

## 2012-09-27 MED ORDER — INSULIN PEN NEEDLE 32G X 6 MM MISC: Status: DC

## 2012-09-27 NOTE — Addendum Note (Signed)
Addended by: Maurice Small on: 09/27/2012 02:26 PM   Modules accepted: Orders

## 2012-09-27 NOTE — Telephone Encounter (Signed)
Rx for Freestyle Lite test strips sent to [facility] on [address] due to Accu Check Aviva no longer being manufactured. Patients wife also given Freestyle Lite Meter for blood glucose testing.

## 2012-09-28 ENCOUNTER — Telehealth: Payer: Self-pay | Admitting: Internal Medicine

## 2012-09-28 MED ORDER — FREESTYLE LANCETS MISC: Status: DC

## 2012-09-28 NOTE — Telephone Encounter (Signed)
Patient's wife is calling to request needles for the blood glucose monitor that they received yesterday. He wants a 3 month supply and is supposed to check his blood sugar 3x a day. Patient uses [facility] on [address].

## 2012-09-28 NOTE — Telephone Encounter (Signed)
Rx sent, left Pt detail VM

## 2012-09-28 NOTE — Telephone Encounter (Signed)
CHF call:  [id number] 

## 2012-10-02 ENCOUNTER — Encounter: Payer: Self-pay | Admitting: Cardiology

## 2012-10-02 ENCOUNTER — Encounter: Payer: Self-pay | Admitting: Internal Medicine

## 2012-10-02 ENCOUNTER — Ambulatory Visit (INDEPENDENT_AMBULATORY_CARE_PROVIDER_SITE_OTHER): Payer: Medicare Other | Admitting: *Deleted

## 2012-10-02 ENCOUNTER — Ambulatory Visit (INDEPENDENT_AMBULATORY_CARE_PROVIDER_SITE_OTHER): Payer: Medicare Other | Admitting: Cardiology

## 2012-10-02 VITALS — BP 132/86 | HR 72 | Ht 72.0 in | Wt 245.0 lb

## 2012-10-02 DIAGNOSIS — Z7901 Long term (current) use of anticoagulants: Secondary | ICD-10-CM

## 2012-10-02 DIAGNOSIS — I428 Other cardiomyopathies: Secondary | ICD-10-CM

## 2012-10-02 DIAGNOSIS — I4891 Unspecified atrial fibrillation: Secondary | ICD-10-CM

## 2012-10-02 DIAGNOSIS — I429 Cardiomyopathy, unspecified: Secondary | ICD-10-CM

## 2012-10-02 DIAGNOSIS — I2510 Atherosclerotic heart disease of native coronary artery without angina pectoris: Secondary | ICD-10-CM

## 2012-10-02 LAB — ICD DEVICE OBSERVATION
TZAT-0001ATACH: 2
TZAT-0001FASTVT: 1
TZAT-0002ATACH: NEGATIVE
TZAT-0004FASTVT: 8
TZAT-0004SLOWVT: 8
TZAT-0004SLOWVT: 8
TZAT-0005FASTVT: 88 pct
TZAT-0005SLOWVT: 88 pct
TZAT-0011SLOWVT: 10 ms
TZAT-0011SLOWVT: 10 ms
TZAT-0012ATACH: 150 ms
TZAT-0012ATACH: 150 ms
TZAT-0012SLOWVT: 200 ms
TZAT-0012SLOWVT: 200 ms
TZAT-0013FASTVT: 1
TZAT-0018ATACH: NEGATIVE
TZAT-0019ATACH: 6 V
TZAT-0019ATACH: 6 V
TZAT-0020ATACH: 1.5 ms
TZAT-0020ATACH: 1.5 ms
TZAT-0020SLOWVT: 1.5 ms
TZON-0003ATACH: 350 ms
TZON-0003SLOWVT: 340 ms
TZON-0003VSLOWVT: 450 ms
TZST-0001ATACH: 4
TZST-0001FASTVT: 2
TZST-0001FASTVT: 5
TZST-0001SLOWVT: 4
TZST-0001SLOWVT: 5
TZST-0002ATACH: NEGATIVE
TZST-0003FASTVT: 25 J
TZST-0003FASTVT: 35 J
TZST-0003FASTVT: 35 J
TZST-0003FASTVT: 35 J
TZST-0003SLOWVT: 15 J
TZST-0003SLOWVT: 35 J
TZST-0003SLOWVT: 35 J

## 2012-10-02 LAB — POCT INR: INR: 2

## 2012-10-02 MED ORDER — GABAPENTIN 100 MG PO CAPS: 100.0000 mg | ORAL_CAPSULE | Freq: Two times a day (BID) | ORAL | Status: DC

## 2012-10-02 MED ORDER — NITROGLYCERIN 0.4 MG SL SUBL: 0.4000 mg | SUBLINGUAL_TABLET | SUBLINGUAL | Status: DC | PRN

## 2012-10-02 NOTE — Patient Instructions (Signed)
Your physician recommends that you continue on your current medications as directed. Please refer to the Current Medication list given to you today.  Your physician wants you to follow-up in:  You will receive a reminder letter in the mail two months in advance. If you don't receive a letter, please call our office to schedule the follow-up appointment.

## 2012-10-02 NOTE — Progress Notes (Signed)
icd check in clinic to turn off LV lead.

## 2012-10-02 NOTE — Assessment & Plan Note (Signed)
The patient's volume status is very dependent on his diuretic dosing. He's doing well at this time. No change in therapy.  There has been question as to why he is not on an ACE inhibitor. I will have to review remote records. It is my recollection that he had a problem taking an ACE inhibitor in the past. This will be reviewed over time.

## 2012-10-02 NOTE — Progress Notes (Signed)
HPI  The patient is seen to followup his overall cardiac status. Most recently he had increased edema. We made an adjustment in the dosing of his chlorthalidone. He is to take this in addition to his other diuretics when his weight used to 238 pounds. This appears to be working well. Today his edema is decreased.  Allergies  Allergen Reactions  . Avelox (Moxifloxacin Hcl In Nacl) Swelling, Rash and Other (See Comments)    Patient became hypotensive after infusion started  . Penicillins     REACTION: anaphylaxis    Current Outpatient Prescriptions  Medication Sig Dispense Refill  . allopurinol (ZYLOPRIM) 100 MG tablet Take 1 tablet (100 mg total) by mouth daily.  30 tablet  5  . amLODipine (NORVASC) 5 MG tablet Take 1 tablet (5 mg total) by mouth 2 (two) times daily.  180 tablet  3  . aspirin 81 MG tablet Take 81 mg by mouth once a week. sunday      . atorvastatin (LIPITOR) 10 MG tablet TAKE ONE TABLET BY MOUTH EVERY DAY  90 tablet  1  . carvedilol (COREG) 25 MG tablet Take 12.5 mg by mouth 2 (two) times daily with a meal.       . chlorthalidone (HYGROTON) 25 MG tablet Take 25 mg by mouth daily. 1/2 tablet as needed      . cholecalciferol (VITAMIN D) 1000 UNITS tablet Take 1,000 Units by mouth daily.      . colchicine 0.6 MG tablet Take 0.6 mg by mouth 2 (two) times daily as needed.      . furosemide (LASIX) 80 MG tablet Take 1 tablet (80 mg total) by mouth 2 (two) times daily.      Marland Kitchen gabapentin (NEURONTIN) 100 MG capsule Take 1 capsule (100 mg total) by mouth 2 (two) times daily.  180 capsule  1  . glucose blood test strip Freestyle Lite test strips, Test blood sugar three times daily  300 each  9  . Insulin Detemir (LEVEMIR FLEXPEN) 100 UNIT/ML SOPN Inject 12 Units into the skin daily.  3 mL  5  . Insulin Pen Needle (NOVOFINE) 32G X 6 MM MISC Use once daily with Levemir injection DX: 250.70  100 each  3  . ipratropium (ATROVENT) 0.06 % nasal spray Place 1 spray into the nose daily.        . Lancets (FREESTYLE) lancets Test three times a day Dx 250.00 freestyle lite lancet  300 each  6  . nitroGLYCERIN (NITROSTAT) 0.4 MG SL tablet Place 1 tablet (0.4 mg total) under the tongue every 5 (five) minutes as needed.  25 tablet  3  . potassium chloride SA (K-DUR,KLOR-CON) 20 MEQ tablet Take 1 tablet (20 mEq total) by mouth 2 (two) times daily.  30 tablet    . warfarin (COUMADIN) 2.5 MG tablet Take 2.5-5 mg by mouth every evening. Pt taking 2.5 mg every day except 5 mg on wed       No current facility-administered medications for this visit.    History   Social History  . Marital Status: Married    Spouse Name: N/A    Number of Children: N/A  . Years of Education: N/A   Occupational History  . Not on file.   Social History Main Topics  . Smoking status: Former Smoker    Quit date: 03/22/1995  . Smokeless tobacco: Former Neurosurgeon  . Alcohol Use: 1.2 oz/week    2 Cans of beer per week  Comment: beer  . Drug Use: No  . Sexually Active: No   Other Topics Concern  . Not on file   Social History Narrative  . No narrative on file    Family History  Problem Relation Age of Onset  . Hypertension Mother   . Stroke Mother   . Diabetes Father   . Coronary artery disease Father   . Other Father     DVT    Past Medical History  Diagnosis Date  . Diabetes mellitus   . Hyperlipidemia   . Hypertension   . Pilonidal cyst   . Atrial fibrillation     Previous long-term amiodarone therapy with multiple cardioversions / amiodarone stopped September, 2009  . Atrial flutter     Started November, 2010, Left-sided and cannot ablate  . Left atrial thrombus     Remote past... cardioversions done since that time  . Wide-complex tachycardia   . Left ventricular ejection fraction less than 40%   . Gout   . AAA (abdominal aortic aneurysm)     Surgical repair  . Discolored skin   . S/P ICD (internal cardiac defibrillator) procedure     Dr. Ladona Ridgel 2009... by the pacing  . SOB  (shortness of breath)     Large left effusion/ thoracentesis/hospitalization/November, 2011... exudated.. cytology negative.. Dr.Wert.. no proof of mesothelioma  . Pericardial effusion   . Pleural effusion     Large loculated effusion on the left side November, 2011. This was tapped. It was exudative. Cytology revealed no cancer no proof of mesothelioma area pulmonary team felt that no further workup was needed  . S/P AAA repair   . Spinal stenosis     Surgery Dr.Elsner  . CAD (coronary artery disease)     Catheterization July, 2008... name and vein grafts patent but low cardiac output  . Warfarin anticoagulation   . Cardiomyopathy     Ischemic... ICD  . CHF (congestive heart failure)     EF 30-40%... echo.. November, 2008  /  EF 55-60% echo... November, 2011  . Venous insufficiency     Toe discoloration chronic  . Mitral regurgitation     Mild echo  . Aortic valve sclerosis   . Nasal drainage     Chronic  . Alcohol ingestion of more than four drinks per week     Excess beer  not a dependency problem  . Chronotropic incompetence     IV pacing rate adjusted  . Thrombophlebitis of superficial veins of upper extremities     Possible venous stenosis from defibrillator  . Pericardial effusion     November, 2011 .. decreased during hospitalization  . Eye abnormality     Ophthalmologist questions a clot in one of his eyes, May, 2012  . Overweight(278.02)     November, 2012  . Pleural thickening   . Ejection fraction < 50%     Ejection fraction has varied over time from 35-50%.,, Echoes are technically very difficult,,, EF 50%, echo, May 25, 2011, technically very difficult  . Drug therapy     Redness and swelling with Avelox infusion May 24, 2011  . COPD (chronic obstructive pulmonary disease)   . Myocardial infarction   . Spinal cord stimulator status     October, 2013  . Carotid artery disease     Doppler, December, 2013, 0-39% bilateral  . Pacemaker   . Pneumonia   .  Arthritis   . ICD (implantable cardiac defibrillator) in place   . Ventral  hernia     April, 2014, result of his abdominal surgery  . Bony abnormality     Patient's manubrium is slightly displaced to the right    Past Surgical History  Procedure Laterality Date  . Colonoscopy w/ polypectomy    . Icd       insertion  . Lumbar fusion    . Pilonidal cyst removal    . Surgery scrotal / testicular    . Coronary artery bypass graft  2004  . Abdominal aortic aneurysm repair  11/2002  . Insert / replace / remove pacemaker    . Back surgery    . Incision and drainage abscess / hematoma of bursa / knee / thigh      Patient Active Problem List   Diagnosis Date Noted  . Ventral hernia   . Bony abnormality   . Cellulitis of scrotum 05/23/2012  . Carotid artery disease   . Spinal cord stimulator status   . Edema 08/31/2011  . 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 Renal insufficiency 07/11/2011  . Encounter for long-term (current) use of anticoagulants 06/07/2011  . Atrial fibrillation   . Wide-complex tachycardia   . Spinal stenosis   . CAD (coronary artery disease)   . Cardiomyopathy   . CHF (congestive heart failure)   . Venous insufficiency   . Mitral regurgitation   . Aortic valve sclerosis   . Alcohol ingestion of more than four drinks per week   . Chronotropic incompetence   . Thrombophlebitis of superficial veins of upper extremities   . Pericardial effusion   . Overweight   . Pleural thickening   . Pleural effusion   . Ejection fraction < 50%   . Drug therapy   . Shortness of breath 05/24/2011  . Paroxysmal ventricular tachycardia 12/07/2010  . Hyperlipidemia   . Hypertension   . Atrial flutter   . Left atrial thrombus   . AAA (abdominal aortic aneurysm)   . S/P ICD (internal cardiac defibrillator) procedure   . Warfarin anticoagulation   . PERIPHERAL NEUROPATHY 01/07/2010  . DIVERTICULOSIS, COLON 06/10/2009  . Diabetes mellitus with  peripheral vascular disease 03/11/2009  . CKD (chronic kidney disease), stage III 06/19/2008  . BACK PAIN, LUMBAR 06/12/2008  . 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 GOUT 07/27/2006  . DVT 07/27/2006  . CORONARY ARTERY BYPASS GRAFT, HX OF 07/27/2006    ROS   Patient denies fever, chills, headache, sweats, rash, change in vision, change in hearing, chest pain, cough, nausea vomiting, urinary symptoms. All other systems are reviewed and are negative.  PHYSICAL EXAM  Patient is oriented to person time and place. Affect is normal. He's here with his wife. There is no jugulovenous distention. Lungs are clear. Respiratory effort is nonlabored. Cardiac exam reveals S1 and S2. There no clicks or significant murmurs. The abdomen is protuberant but he improved from the past. There is no significant peripheral edema.  Filed Vitals:   10/02/12 1333  BP: 132/86  Pulse: 72  Height: 6' (1.829 m)  Weight: 245 lb (111.131 kg)     ASSESSMENT & PLAN

## 2012-10-02 NOTE — Assessment & Plan Note (Signed)
The patient is to have some reprogramming done later today in our office.

## 2012-10-02 NOTE — Telephone Encounter (Signed)
**Note De-Identified [name] Obfuscation** Weight= 238 lbs.  Pt states that his edema is better and he has no SOB at this time.

## 2012-10-02 NOTE — Assessment & Plan Note (Signed)
Coronary disease is stable. No change in therapy. 

## 2012-10-04 ENCOUNTER — Encounter: Payer: Self-pay | Admitting: Cardiology

## 2012-10-04 DIAGNOSIS — R05 Cough: Secondary | ICD-10-CM | POA: Insufficient documentation

## 2012-10-04 DIAGNOSIS — T464X5A Adverse effect of angiotensin-converting-enzyme inhibitors, initial encounter: Secondary | ICD-10-CM

## 2012-10-04 NOTE — Progress Notes (Signed)
   Question has been raised appropriately why the patient is not on an ACE inhibitor or an ARB. I have reviewed his extensive records. I found that he and the range of 2011 we felt he had a cough from an ACE inhibitor. He was switched to losartan. He was on this and it is gone from his medication list in May, 2013. This may have been related to hospitalization. I did not, and in the records that we have stopped the medication for reason. Therefore would be very reasonable to place him back on losartan.

## 2012-10-11 ENCOUNTER — Other Ambulatory Visit: Payer: Self-pay | Admitting: Pharmacist

## 2012-10-11 MED ORDER — WARFARIN SODIUM 2.5 MG PO TABS: ORAL_TABLET | ORAL | Status: DC

## 2012-10-16 NOTE — Telephone Encounter (Signed)
**Note De-identified [name] Obfuscation** CHF call:  LMTCB 

## 2012-10-17 NOTE — Telephone Encounter (Signed)
**Note De-Identified Nicoli Nardozzi Obfuscation** Weight= 237 lbs.  Pt c/o increased right leg edema and denies SOB at this time.

## 2012-10-19 ENCOUNTER — Telehealth: Payer: Self-pay

## 2012-10-19 NOTE — Telephone Encounter (Signed)
Weight= 243 lbs.  Pts wife, Ishan, states that the pt's right leg edema has improved a little and that he has no SOB at this time.

## 2012-10-22 ENCOUNTER — Encounter: Payer: Self-pay | Admitting: Internal Medicine

## 2012-10-22 NOTE — Telephone Encounter (Signed)
LMTCB

## 2012-10-23 ENCOUNTER — Encounter: Payer: Self-pay | Admitting: Cardiology

## 2012-10-23 NOTE — Progress Notes (Signed)
   In my prior documentation, I noted that there was no definite reason why a low sort and had been stopped. Today we will be contacting the patient to restart losartan at 50 mg daily. Plan will be for him to have a followup chemistry check at 10-14 days.

## 2012-10-25 NOTE — Telephone Encounter (Signed)
**Note De-Identified [name] Obfuscation** Weight= 237 lbs.  Pts wife, [name], reports no change in pts edema or SOB.

## 2012-10-26 ENCOUNTER — Telehealth: Payer: Self-pay

## 2012-10-26 DIAGNOSIS — I2510 Atherosclerotic heart disease of native coronary artery without angina pectoris: Secondary | ICD-10-CM

## 2012-10-26 MED ORDER — LOSARTAN POTASSIUM 50 MG PO TABS: 50.0000 mg | ORAL_TABLET | Freq: Every day | ORAL | Status: DC

## 2012-10-26 NOTE — Telephone Encounter (Signed)
Weight= 236 lbs.  Pt's wife, Carson, reports no change in pts edema or SOB.

## 2012-10-26 NOTE — Telephone Encounter (Signed)
**Note De-Identified [name] Obfuscation** Pt and his wife advised that per Dr Myrtis Ser pt needs to resume Losartan at 50 mg daily and to have BMET drawn on 8/15, they both verbalized understanding. BMET ordered and scheduled to be drawn on 8/15 and Losartan RX sent to [facility] per pt request to fill.         [name] Abed, MD ','<More Detail >>       [name] Abed, MD  [name]     MRN: [id number] DOB: [birth year]     Pt Home: [id number]     Sent: Tue October 23, 2012 9:40 AM     To: [name] Fellers Kathlen Sakurai, LPN                          Message    Please call [name]. I have reviewed very carefully the history over time of his medications. He was on losartan (ARB). At some point he was no longer taking it, but there is no evidence that he had a problem with it. Therefore I would like to start him back on losartan 50 mg daily. Please explain this to him and start the medicine. Please plan a Bmet 10 days to 2 weeks after he starts the medicine.   ----- Message -----   From: [name], MD   Sent: 09/30/2012 12:33 PM   To: [name] Abed, MD      Should he be on ACE-I (Ramipril)or ARB with DM , cardiomyopathy & intermittent renal compromise ? I'm not sure who is Rxer for Amlodipine & Carvedilol                  Forwarded by:    [name] Abed, MD Date: 10/23/2012

## 2012-10-26 NOTE — Telephone Encounter (Signed)
Weight= 236 lbs.  Pts wife, [name], reports no change in pts edema or SOB.

## 2012-10-30 NOTE — Telephone Encounter (Signed)
**Note De-Identified [name] Obfuscation** Weight= 238 lbs.  Pts wife, Hitoshi, states that pt has increased edema in both knees that she does not think is cardiac related. She states he is seeing his orthopedic MD next week. Also, Isadore states that the pt has no SOB at this time. Pt took Chlorthalidone dose today.

## 2012-11-01 NOTE — Telephone Encounter (Signed)
Weight= 238 lbs. Pts wife, Jerl, states that the pts edema is better in left knee but worse in right leg and that he has no SOB at this time. Pt took Chlorthalidone dose today due to weight of 238 lbs.

## 2012-11-02 ENCOUNTER — Other Ambulatory Visit (INDEPENDENT_AMBULATORY_CARE_PROVIDER_SITE_OTHER): Payer: Medicare Other

## 2012-11-02 DIAGNOSIS — I2510 Atherosclerotic heart disease of native coronary artery without angina pectoris: Secondary | ICD-10-CM

## 2012-11-02 LAB — BASIC METABOLIC PANEL
BUN: 29 mg/dL — ABNORMAL HIGH (ref 6–23)
CO2: 30 mEq/L (ref 19–32)
Chloride: 96 mEq/L (ref 96–112)
GFR: 47.49 mL/min — ABNORMAL LOW (ref >60.00)
Glucose, Bld: 288 mg/dL — ABNORMAL HIGH (ref 70–99)
Potassium: 3.7 mEq/L (ref 3.5–5.1)
Sodium: 135 mEq/L (ref 135–145)

## 2012-11-02 NOTE — Telephone Encounter (Signed)
**Note De-Identified Almus Woodham Obfuscation** Weight= 238 lbs.   Pts wife, [name], states that there has been no change in the pts edema or SOB. Pt took Chlorthalidone dose today due to weight of 238 lbs.

## 2012-11-06 NOTE — Telephone Encounter (Signed)
Weight= 235 lbs. Pts wife, Skiler, states that the pt continues to have edema in right leg and no sob at this time. Pt has appt to see his orthopedic MD tomorrow concerning right leg edema.

## 2012-11-08 ENCOUNTER — Encounter: Payer: Self-pay | Admitting: Internal Medicine

## 2012-11-08 NOTE — Telephone Encounter (Signed)
Weight= 237 lbs.  Pts wife, Dominico, reports no change in pts leg edema or SOB. She states that the pt was seen by his Orthopedic MD yesterday and that MD advised him that the swelling in his right leg may be due to calcification.

## 2012-11-09 ENCOUNTER — Encounter: Payer: Self-pay | Admitting: Internal Medicine

## 2012-11-09 ENCOUNTER — Telehealth: Payer: Self-pay | Admitting: Internal Medicine

## 2012-11-09 NOTE — Telephone Encounter (Signed)
Message sent to patient via Mychart with Dr.Paz's instructions

## 2012-11-09 NOTE — Telephone Encounter (Signed)
Message from pt: [name] started taking insulin on 7/12 with 12 units.His starting avg was 219. His avg today using 22 units is 182.We want to know does he need to continue upping units by 2 each week until he reaches avg of 140 to 160 or do we need to do something different. We do not have appt but have lab work on 10/2. Let us know if we need to change anything. [name] [name]) [phone]  Advise pt: ok to check checking CBGs and increase insulin 3 more times if needed (up to 28 units), then call w/ CBG readings

## 2012-11-09 NOTE — Telephone Encounter (Signed)
LMTCB

## 2012-11-12 NOTE — Telephone Encounter (Signed)
Weight= 238 lbs.  Pts wife, Winfield, states that the pt continue to have edema in right leg and no sob at this time. Pt took Chlorthalidone dose today due to weight of 238 lbs.

## 2012-11-13 ENCOUNTER — Ambulatory Visit (INDEPENDENT_AMBULATORY_CARE_PROVIDER_SITE_OTHER): Payer: Medicare Other | Admitting: Pharmacist

## 2012-11-13 DIAGNOSIS — I4891 Unspecified atrial fibrillation: Secondary | ICD-10-CM

## 2012-11-13 DIAGNOSIS — Z7901 Long term (current) use of anticoagulants: Secondary | ICD-10-CM

## 2012-11-13 NOTE — Telephone Encounter (Signed)
Weight= 238 lbs. Pts wife, Adlai, reports no change in pts edema or his SOB at this time. Pt took dose of Chlorthalidone this am.

## 2012-11-14 NOTE — Telephone Encounter (Addendum)
**Note De-Identified Elwyn Lowden Obfuscation** Weight= 239 lbs.  Pts wife states that the pt continues to have edema in right leg and no SOB at this time. Pt took Chlorthalidone dose today due to weight of 239 lbs.

## 2012-11-20 ENCOUNTER — Telehealth: Payer: Self-pay

## 2012-11-20 NOTE — Telephone Encounter (Signed)
CHF call: Pt is at the beach this week. LMTCB on cell phone.

## 2012-11-22 NOTE — Telephone Encounter (Signed)
Pt is at the beach this week (September 1-5).

## 2012-11-26 ENCOUNTER — Other Ambulatory Visit: Payer: Self-pay | Admitting: Internal Medicine

## 2012-11-27 NOTE — Telephone Encounter (Signed)
Weight= 239 lbs.  Pts wife, Bain, states that the pt has no SOB at this time and that he has been wearing his compression stockings and elevating feet as much as possible so the swelling in his right leg has improved slightly.

## 2012-11-28 NOTE — Telephone Encounter (Signed)
**Note De-Identified [name] Obfuscation** Weight= 238 lbs.  Pts wife, [name], reports no changes in pts edema or SOB. The pt took Chlorthalidone dose today due to weight of 238 lbs.

## 2012-11-28 NOTE — Telephone Encounter (Signed)
Rx sent to the pharmacy by e-script.//AB/CMA 

## 2012-11-29 NOTE — Telephone Encounter (Signed)
**Note De-identified [name] Obfuscation** LMTCB

## 2012-12-03 NOTE — Telephone Encounter (Addendum)
Weight= 238 lbs.  Pt states that he has no SOB at this time and he states that the swelling in his right leg has improved somewhat.  The pt took Chlorthalidone dose today.

## 2012-12-04 NOTE — Telephone Encounter (Signed)
**Note De-Identified [name] Obfuscation** Weight= 239 lbs. Pts wife, Maveryk, reports no change in his edema or SOB at this time. Pt took Chlorthalidone dose today due to weight greater than 238 lbs.

## 2012-12-07 ENCOUNTER — Telehealth: Payer: Self-pay | Admitting: *Deleted

## 2012-12-07 ENCOUNTER — Telehealth: Payer: Self-pay | Admitting: Internal Medicine

## 2012-12-07 NOTE — Telephone Encounter (Signed)
**Note De-identified [name] Obfuscation** LMTCB

## 2012-12-07 NOTE — Telephone Encounter (Signed)
Patient's wife is calling with questions about the patient's blood sugar readings. Please advise.

## 2012-12-07 NOTE — Telephone Encounter (Signed)
Wife returning call to nurse.

## 2012-12-10 NOTE — Telephone Encounter (Signed)
**Note De-identified Joeanne Robicheaux Obfuscation** Pt's wife advised.

## 2012-12-10 NOTE — Telephone Encounter (Signed)
**Note De-identified [name] Obfuscation** LMTCB

## 2012-12-11 NOTE — Telephone Encounter (Signed)
**Note De-Identified [name] Obfuscation** Weight= 240 lbs.   Pts wife , [name], states that the pt continues to have right leg edema and no SOB at this time. Pt took Chlorthalidone dose today due to weight greater than 238 lbs.

## 2012-12-13 NOTE — Telephone Encounter (Signed)
Spoke with the pt's wife(Frances Dewayne Hatch) who stated that the pt has not reached the average fasting blood sugar.  His blood sugar last week(which is 11 weeks) was 183 at 32 units.  This week his reading has been (174,161,159,165,and today 158) still at 32 units.  The wife stated that he is increasing his insulin dose by 2 units once a week.  Looking back at the last office instructions pt was to increase the daily insulin dose by 2 units each day each week until the average fasting blood sugar is 140-160.   Pt is scheduled to have BMP and A1C done 12-20-12.  The wife is wanting to know if the pt will need to see the doctor that day as well.  Please advise.//AB/CMA

## 2012-12-13 NOTE — Telephone Encounter (Signed)
Spoke with patients wife to inform her of Dr. Caryl Never instructions. She stated that they would make an appointment on October 3rd to discuss blood sugar and A1c results.

## 2012-12-13 NOTE — Telephone Encounter (Signed)
Appt 1-2 days after repeat A1c; continue to increase insulin by 2 units each week as per protocol

## 2012-12-14 ENCOUNTER — Ambulatory Visit (INDEPENDENT_AMBULATORY_CARE_PROVIDER_SITE_OTHER): Payer: Medicare Other

## 2012-12-14 DIAGNOSIS — Z7901 Long term (current) use of anticoagulants: Secondary | ICD-10-CM

## 2012-12-14 DIAGNOSIS — I4891 Unspecified atrial fibrillation: Secondary | ICD-10-CM

## 2012-12-14 LAB — POCT INR: INR: 2.5

## 2012-12-14 NOTE — Telephone Encounter (Signed)
**Note De-Identified Dae Highley Obfuscation** Weight= 238 lbs.  The pt reports no changes in his right leg edema and that he has no SOB at this time. Pt took Chlorthalidone dose today due to weight of 238 lbs.

## 2012-12-17 ENCOUNTER — Other Ambulatory Visit: Payer: Self-pay

## 2012-12-17 MED ORDER — CHLORTHALIDONE 25 MG PO TABS: 12.5000 mg | ORAL_TABLET | ORAL | Status: DC | PRN

## 2012-12-17 NOTE — Telephone Encounter (Signed)
**Note De-Identified Ewa Hipp Obfuscation** Weight= 239 lbs.  Pts wife, [name], states that the pt continues to have right leg edema and no SOB at this time. Pt took Chlorthalidone dose today due to weight greater than 238 lbs.

## 2012-12-18 NOTE — Telephone Encounter (Signed)
**Note De-Identified [name] Obfuscation** Weight= 239 lbs.  Pts wife states that the edema in the pts right leg is no better today than yesterday and that he has no SOB at this time. The pt took Chlorthalidone dose today due to weight greater than 238 lbs.

## 2012-12-19 ENCOUNTER — Telehealth: Payer: Self-pay

## 2012-12-19 ENCOUNTER — Other Ambulatory Visit: Payer: Self-pay | Admitting: Internal Medicine

## 2012-12-19 ENCOUNTER — Other Ambulatory Visit (INDEPENDENT_AMBULATORY_CARE_PROVIDER_SITE_OTHER): Payer: Medicare Other

## 2012-12-19 DIAGNOSIS — E1059 Type 1 diabetes mellitus with other circulatory complications: Secondary | ICD-10-CM

## 2012-12-19 DIAGNOSIS — E785 Hyperlipidemia, unspecified: Secondary | ICD-10-CM

## 2012-12-19 DIAGNOSIS — T887XXA Unspecified adverse effect of drug or medicament, initial encounter: Secondary | ICD-10-CM

## 2012-12-19 DIAGNOSIS — Z131 Encounter for screening for diabetes mellitus: Secondary | ICD-10-CM

## 2012-12-19 DIAGNOSIS — E876 Hypokalemia: Secondary | ICD-10-CM

## 2012-12-19 DIAGNOSIS — I10 Essential (primary) hypertension: Secondary | ICD-10-CM

## 2012-12-19 LAB — BASIC METABOLIC PANEL
Calcium: 8.9 mg/dL (ref 8.4–10.5)
Creatinine, Ser: 1.6 mg/dL — ABNORMAL HIGH (ref 0.4–1.5)
Sodium: 138 mEq/L (ref 135–145)

## 2012-12-19 LAB — HEMOGLOBIN A1C: Hgb A1c MFr Bld: 10.2 % — ABNORMAL HIGH (ref 4.6–6.5)

## 2012-12-19 NOTE — Telephone Encounter (Signed)
CHF Call Weight= 238 lbs.  The pts wife, Daray, reports no changes in the pts edema or SOB at this time. The pt took Chlorthalidone dose today due to weight of 238 lbs.

## 2012-12-20 ENCOUNTER — Encounter: Payer: Self-pay | Admitting: Internal Medicine

## 2012-12-20 ENCOUNTER — Ambulatory Visit (INDEPENDENT_AMBULATORY_CARE_PROVIDER_SITE_OTHER): Payer: Medicare Other | Admitting: Internal Medicine

## 2012-12-20 ENCOUNTER — Telehealth: Payer: Self-pay | Admitting: *Deleted

## 2012-12-20 ENCOUNTER — Other Ambulatory Visit: Payer: Medicare Other

## 2012-12-20 VITALS — BP 98/69 | HR 76 | Temp 99.0°F | Wt 245.4 lb

## 2012-12-20 DIAGNOSIS — Z23 Encounter for immunization: Secondary | ICD-10-CM

## 2012-12-20 DIAGNOSIS — E876 Hypokalemia: Secondary | ICD-10-CM

## 2012-12-20 DIAGNOSIS — E1159 Type 2 diabetes mellitus with other circulatory complications: Secondary | ICD-10-CM

## 2012-12-20 DIAGNOSIS — I798 Other disorders of arteries, arterioles and capillaries in diseases classified elsewhere: Secondary | ICD-10-CM

## 2012-12-20 DIAGNOSIS — E1151 Type 2 diabetes mellitus with diabetic peripheral angiopathy without gangrene: Secondary | ICD-10-CM

## 2012-12-20 DIAGNOSIS — E1149 Type 2 diabetes mellitus with other diabetic neurological complication: Secondary | ICD-10-CM

## 2012-12-20 MED ORDER — ALLOPURINOL 100 MG PO TABS: 100.0000 mg | ORAL_TABLET | Freq: Every day | ORAL | Status: DC
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
## 2012-12-20 NOTE — Assessment & Plan Note (Signed)
KCl 20 mEq TWO twice a day  BMET  12/24/12

## 2012-12-20 NOTE — Telephone Encounter (Signed)
Spoke with the pt's wife and she stated that the pt received the message but did not understand.  She stated that the pt has an appt today with Dr. Alwyn Ren and they will discuss the results then.//AB/CMA

## 2012-12-20 NOTE — Telephone Encounter (Signed)
Message copied by Verdie Shire on Thu Dec 20, 2012  8:48 AM ------      Message from: [name]      Created: Wed Dec 19, 2012  6:13 PM       Potassium 2.9; left message on the mobile [phone] for patient to add 20 Meq twice a day to his present dose. Recheck BMET on Monday ------

## 2012-12-20 NOTE — Patient Instructions (Addendum)
  Increase insulin to 35 units daily.  It is critical to recheck the BMET 12/24/12 because of the increase in potassium to 20 mEq 2 pills twice a day to treat the potassium of  2.9 on 12/19/12. FAX to [phone]  This will be discussed with Dr. Myrtis Ser. The low potassium is undoubtedly related to the high-dose furosemide 80 mg twice a day in addition to the one half pill of 25 mg of Hygroton. Spironolactone does not cause potassium loss; but it is relatively contraindicated with your creatinine of 1.6.

## 2012-12-21 NOTE — Assessment & Plan Note (Signed)
Increase basal insulin to 35 u; Endocrinology consult

## 2012-12-25 ENCOUNTER — Telehealth: Payer: Self-pay | Admitting: Internal Medicine

## 2012-12-25 NOTE — Telephone Encounter (Signed)
Patient wife called about her husband blood work that was done out of town. Please give patient a call back thanks

## 2012-12-27 ENCOUNTER — Other Ambulatory Visit: Payer: Self-pay | Admitting: *Deleted

## 2012-12-27 MED ORDER — POTASSIUM CHLORIDE CRYS ER 20 MEQ PO TBCR: 20.0000 meq | EXTENDED_RELEASE_TABLET | Freq: Two times a day (BID) | ORAL | Status: DC

## 2012-12-27 MED ORDER — POTASSIUM CHLORIDE CRYS ER 20 MEQ PO TBCR: 20.0000 meq | EXTENDED_RELEASE_TABLET | Freq: Every day | ORAL | Status: DC

## 2012-12-27 NOTE — Telephone Encounter (Signed)
Potassium refill sent to pharmacy for 20 meq daily

## 2012-12-27 NOTE — Telephone Encounter (Signed)
Weight= 238 lbs.  The pts wife, Angas, reports no changes in pts edema or SOB at this time. She states that he did not take Chlorthalidone dose today.

## 2012-12-28 NOTE — Telephone Encounter (Signed)
Spoke with the pt's wife and informed her of the pt's recent lab results and note.  Informed her that the pt will need to decrease Potassium to prior dose(60meq twice a day=41meq daily).  She understood and agreed.  She also stated that Dr. Alwyn Ren changed the pt's insulin(Levemir)to 35units daily and his BS's have been ave.around the 160's,so she wanted to know if the pt needed to take on the same dose.  Informed the wife that the pt will need to stay on the same dose and keep the appt with the Endocrinology on (01-11-13).  She understood and agreed.  She asked if a copy of the recent labs be mailed to them.  Mailed copy of lab to the pt.//AB/CMA

## 2012-12-28 NOTE — Telephone Encounter (Signed)
**Note De-Identified [name] Obfuscation** Weight= 238 lbs. The pt reports no change in his edema or SOB at this time.

## 2013-01-01 NOTE — Telephone Encounter (Signed)
Weight= 240 lbs.   The pts wife, [name], reports no changes in pts edema or SOB at this time. The pt took Chlorthalidone dose today due to weight greater than 238 lbs.

## 2013-01-03 NOTE — Telephone Encounter (Signed)
LMTCB

## 2013-01-04 NOTE — Telephone Encounter (Signed)
**Note De-identified [name] Obfuscation** LMTCB

## 2013-01-07 ENCOUNTER — Ambulatory Visit (INDEPENDENT_AMBULATORY_CARE_PROVIDER_SITE_OTHER): Payer: Medicare Other | Admitting: Cardiology

## 2013-01-07 ENCOUNTER — Ambulatory Visit (INDEPENDENT_AMBULATORY_CARE_PROVIDER_SITE_OTHER): Payer: Medicare Other | Admitting: *Deleted

## 2013-01-07 ENCOUNTER — Encounter: Payer: Self-pay | Admitting: Cardiology

## 2013-01-07 ENCOUNTER — Encounter: Payer: Self-pay | Admitting: Internal Medicine

## 2013-01-07 VITALS — BP 82/54 | HR 75 | Ht 72.0 in | Wt 245.0 lb

## 2013-01-07 DIAGNOSIS — R9430 Abnormal result of cardiovascular function study, unspecified: Secondary | ICD-10-CM | POA: Insufficient documentation

## 2013-01-07 DIAGNOSIS — Z95810 Presence of automatic (implantable) cardiac defibrillator: Secondary | ICD-10-CM

## 2013-01-07 DIAGNOSIS — I428 Other cardiomyopathies: Secondary | ICD-10-CM

## 2013-01-07 DIAGNOSIS — I872 Venous insufficiency (chronic) (peripheral): Secondary | ICD-10-CM

## 2013-01-07 DIAGNOSIS — I429 Cardiomyopathy, unspecified: Secondary | ICD-10-CM

## 2013-01-07 DIAGNOSIS — I4589 Other specified conduction disorders: Secondary | ICD-10-CM

## 2013-01-07 DIAGNOSIS — I509 Heart failure, unspecified: Secondary | ICD-10-CM

## 2013-01-07 DIAGNOSIS — N183 Chronic kidney disease, stage 3 unspecified: Secondary | ICD-10-CM

## 2013-01-07 DIAGNOSIS — IMO0002 Reserved for concepts with insufficient information to code with codable children: Secondary | ICD-10-CM

## 2013-01-07 DIAGNOSIS — Z7901 Long term (current) use of anticoagulants: Secondary | ICD-10-CM

## 2013-01-07 DIAGNOSIS — Z9689 Presence of other specified functional implants: Secondary | ICD-10-CM

## 2013-01-07 DIAGNOSIS — I4891 Unspecified atrial fibrillation: Secondary | ICD-10-CM

## 2013-01-07 DIAGNOSIS — I2510 Atherosclerotic heart disease of native coronary artery without angina pectoris: Secondary | ICD-10-CM

## 2013-01-07 DIAGNOSIS — Z9889 Other specified postprocedural states: Secondary | ICD-10-CM

## 2013-01-07 DIAGNOSIS — I5042 Chronic combined systolic (congestive) and diastolic (congestive) heart failure: Secondary | ICD-10-CM

## 2013-01-07 DIAGNOSIS — R0989 Other specified symptoms and signs involving the circulatory and respiratory systems: Secondary | ICD-10-CM

## 2013-01-07 LAB — BASIC METABOLIC PANEL WITH GFR
BUN: 35 mg/dL — ABNORMAL HIGH (ref 6–23)
CO2: 34 meq/L — ABNORMAL HIGH (ref 19–32)
Calcium: 9 mg/dL (ref 8.4–10.5)
Chloride: 93 meq/L — ABNORMAL LOW (ref 96–112)
Creatinine, Ser: 1.7 mg/dL — ABNORMAL HIGH (ref 0.4–1.5)
GFR: 42.07 mL/min — ABNORMAL LOW
Glucose, Bld: 342 mg/dL — ABNORMAL HIGH (ref 70–99)
Potassium: 3.5 meq/L (ref 3.5–5.1)
Sodium: 137 meq/L (ref 135–145)

## 2013-01-07 LAB — POCT INR: INR: 2.2

## 2013-01-07 NOTE — Assessment & Plan Note (Addendum)
The patient's most recent echo in 2013 showed an EF of 50-55%. The patient takes high dose diuretics. In addition I had chlorthalidone as needed for a weight of of 236 pounds on his home scale. Recently his weight has been above this each day. Therefore he's been using the chlorthalidone each day. It is possible that he has gained some true body weight. Chemistry labs checked today. After I have this result I will decide if I will adjust his dry weight. He is concerned about the swelling that he gets in his right leg during the day. I believe this can be helped if he wears his support hose at the right time of the day. I carefully instructed him to stop wearing the support hose when he is in bed at night. He will shower at night so that he does not have to do this in the morning. He will then put his support hose on in bed before he gets out of bed.  As part of today's evaluation I spent for greater than 25 minutes with his total care. More than half of 25 minutes was spent with direct contact with the patient and his wife. We had a long discussion about how to wear his support hose and adjust his salt and fluids.

## 2013-01-07 NOTE — Assessment & Plan Note (Signed)
Venous insufficiency plays a role with his swelling. He does best when he wears his support hose.

## 2013-01-07 NOTE — Telephone Encounter (Signed)
**Note De-Identified [name] Obfuscation** Office weight= 245 lbs. Home weight= 138 lbs.  The pt had OV with Dr Myrtis Ser today and a BMET was ordered. Per Dr Myrtis Ser and due to lab results, the pt is advised that his dry weight is now 242 lbs. and that he should not take a dose of Chlorthalidone 12.5 mg unless his weight is at 242 lbs or greater. Also, Dr Myrtis Ser recommends that the pt have a BMET drawn in 10 days, the pt verbalized understanding to all instructions given and states he will come to office on 10/30 to have BMET drawn.

## 2013-01-07 NOTE — Assessment & Plan Note (Signed)
Patient continues on Coumadin for his atrial arrhythmia.

## 2013-01-07 NOTE — Progress Notes (Signed)
HPI  Patient is seen today to followup coronary disease, ischemic cardiomyopathy, arrhythmias, ICD, fluid overload. He is actually feeling relatively well. He stay he has recurrent swelling in his right leg but not the left leg. The right leg does respond to elevation. In addition he does better when he is wearing his support hose. Recently he's been wearing his support hose at nighttime (not sure why). We will carefully change this to the daytime.  Allergies  Allergen Reactions  . Avelox [Moxifloxacin Hcl In Nacl] Swelling, Rash and Other (See Comments)    Patient became hypotensive after infusion started Because of a history of documented adverse serious drug reaction;Medi Alert bracelet  is recommended  . Penicillins     REACTION: anaphylaxis Because of a history of documented adverse serious drug reaction;Medi Alert bracelet  is recommended    Current Outpatient Prescriptions  Medication Sig Dispense Refill  . allopurinol (ZYLOPRIM) 100 MG tablet Take 1 tablet (100 mg total) by mouth daily.  30 tablet  5  . amLODipine (NORVASC) 5 MG tablet Take 1 tablet (5 mg total) by mouth 2 (two) times daily.  180 tablet  3  . aspirin 81 MG tablet Take 81 mg by mouth once a week. sunday      . atorvastatin (LIPITOR) 10 MG tablet TAKE ONE TABLET BY MOUTH EVERY DAY  90 tablet  1  . carvedilol (COREG) 25 MG tablet Take 12.5 mg by mouth 2 (two) times daily with a meal.       . chlorthalidone (HYGROTON) 25 MG tablet Take 0.5 tablets (12.5 mg total) by mouth as needed.  30 tablet  3  . cholecalciferol (VITAMIN D) 1000 UNITS tablet Take 1,000 Units by mouth daily.      Marland Kitchen COLCRYS 0.6 MG tablet TAKE AS DIRECTED  100 tablet  0  . furosemide (LASIX) 80 MG tablet Take 1 tablet (80 mg total) by mouth 2 (two) times daily.      Marland Kitchen gabapentin (NEURONTIN) 100 MG capsule Take 1 capsule (100 mg total) by mouth 2 (two) times daily.  180 capsule  1  . glucose blood test strip Freestyle Lite test strips, Test blood  sugar three times daily  300 each  9  . Insulin Detemir (LEVEMIR FLEXPEN) 100 UNIT/ML SOPN Inject 12 Units into the skin daily.  3 mL  5  . Insulin Pen Needle (NOVOFINE) 32G X 6 MM MISC Use once daily with Levemir injection DX: 250.70  100 each  3  . ipratropium (ATROVENT) 0.06 % nasal spray Place 1 spray into the nose daily.       . Lancets (FREESTYLE) lancets Test three times a day Dx 250.00 freestyle lite lancet  300 each  6  . losartan (COZAAR) 50 MG tablet Take 1 tablet (50 mg total) by mouth daily.  90 tablet  1  . nitroGLYCERIN (NITROSTAT) 0.4 MG SL tablet Place 1 tablet (0.4 mg total) under the tongue every 5 (five) minutes as needed.  25 tablet  3  . potassium chloride SA (K-DUR,KLOR-CON) 20 MEQ tablet Take 1 tablet (20 mEq total) by mouth 2 (two) times daily.  60 tablet  5  . warfarin (COUMADIN) 2.5 MG tablet Take as directed by Anticoagulation clinic  120 tablet  1   No current facility-administered medications for this visit.    History   Social History  . Marital Status: Married    Spouse Name: N/A    Number of Children: N/A  .  Years of Education: N/A   Occupational History  . Not on file.   Social History Main Topics  . Smoking status: Former Smoker    Quit date: 03/22/1995  . Smokeless tobacco: Former Neurosurgeon  . Alcohol Use: 1.2 oz/week    2 Cans of beer per week     Comment: beer  . Drug Use: No  . Sexual Activity: No   Other Topics Concern  . Not on file   Social History Narrative  . No narrative on file    Family History  Problem Relation Age of Onset  . Hypertension Mother   . Stroke Mother   . Diabetes Father   . Coronary artery disease Father   . Other Father     DVT    Past Medical History  Diagnosis Date  . Diabetes mellitus   . Hyperlipidemia   . Hypertension   . Pilonidal cyst   . Atrial fibrillation     Previous long-term amiodarone therapy with multiple cardioversions / amiodarone stopped September, 2009  . Atrial flutter     Started  November, 2010, Left-sided and cannot ablate  . Left atrial thrombus     Remote past... cardioversions done since that time  . Wide-complex tachycardia   . Left ventricular ejection fraction less than 40%   . Gout   . AAA (abdominal aortic aneurysm)     Surgical repair  . Discolored skin   . S/P ICD (internal cardiac defibrillator) procedure     Dr. Ladona Ridgel 2009... by the pacing  . SOB (shortness of breath)     Large left effusion/ thoracentesis/hospitalization/November, 2011... exudated.. cytology negative.. Dr.Wert.. no proof of mesothelioma  . Pericardial effusion   . Pleural effusion     Large loculated effusion on the left side November, 2011. This was tapped. It was exudative. Cytology revealed no cancer no proof of mesothelioma area pulmonary team felt that no further workup was needed  . S/P AAA repair   . Spinal stenosis     Surgery Dr.Elsner  . CAD (coronary artery disease)     Catheterization July, 2008... name and vein grafts patent but low cardiac output  . Warfarin anticoagulation   . Cardiomyopathy     Ischemic... ICD  . CHF (congestive heart failure)     EF 30-40%... echo.. November, 2008  /  EF 55-60% echo... November, 2011  . Venous insufficiency     Toe discoloration chronic  . Mitral regurgitation     Mild echo  . Aortic valve sclerosis   . Nasal drainage     Chronic  . Alcohol ingestion of more than four drinks per week     Excess beer  not a dependency problem  . Chronotropic incompetence     IV pacing rate adjusted  . Thrombophlebitis of superficial veins of upper extremities     Possible venous stenosis from defibrillator  . Pericardial effusion     November, 2011 .. decreased during hospitalization  . Eye abnormality     Ophthalmologist questions a clot in one of his eyes, May, 2012  . Overweight(278.02)     November, 2012  . Pleural thickening   . Ejection fraction < 50%     Ejection fraction has varied over time from 35-50%.,, Echoes are  technically very difficult,,, EF 50%, echo, May 25, 2011, technically very difficult  . Drug therapy     Redness and swelling with Avelox infusion May 24, 2011  . COPD (chronic obstructive pulmonary disease)   .  Myocardial infarction   . Spinal cord stimulator status     October, 2013  . Carotid artery disease     Doppler, December, 2013, 0-39% bilateral  . Pacemaker   . Pneumonia   . Arthritis   . ICD (implantable cardiac defibrillator) in place   . Ventral hernia     April, 2014, result of his abdominal surgery  . Bony abnormality     Patient's manubrium is slightly displaced to the right    Past Surgical History  Procedure Laterality Date  . Colonoscopy w/ polypectomy    . Icd       insertion  . Lumbar fusion    . Pilonidal cyst removal    . Surgery scrotal / testicular    . Coronary artery bypass graft  2004  . Abdominal aortic aneurysm repair  11/2002  . Insert / replace / remove pacemaker    . Back surgery    . Incision and drainage abscess / hematoma of bursa / knee / thigh      Patient Active Problem List   Diagnosis Date Noted  . Hypopotassemia 12/20/2012  . ACE-inhibitor cough 10/04/2012  . Ventral hernia   . Bony abnormality   . Cellulitis of scrotum 05/23/2012  . Carotid artery disease   . Spinal cord stimulator status   . Edema 08/31/2011  . 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 Renal insufficiency 07/11/2011  . Encounter for long-term (current) use of anticoagulants 06/07/2011  . Atrial fibrillation   . Wide-complex tachycardia   . Spinal stenosis   . CAD (coronary artery disease)   . Cardiomyopathy   . CHF (congestive heart failure)   . Venous insufficiency   . Mitral regurgitation   . Aortic valve sclerosis   . Alcohol ingestion of more than four drinks per week   . Chronotropic incompetence   . Thrombophlebitis of superficial veins of upper extremities   . Pericardial effusion   . Overweight   . Pleural thickening     . Pleural effusion   . Ejection fraction < 50%   . Drug therapy   . Shortness of breath 05/24/2011  . Paroxysmal ventricular tachycardia 12/07/2010  . Hyperlipidemia   . Hypertension   . Atrial flutter   . Left atrial thrombus   . AAA (abdominal aortic aneurysm)   . S/P ICD (internal cardiac defibrillator) procedure   . Warfarin anticoagulation   . PERIPHERAL NEUROPATHY 01/07/2010  . DIVERTICULOSIS, COLON 06/10/2009  . Uncontrolled diabetes mellitus with peripheral circulatory disorder 03/11/2009  . CKD (chronic kidney disease), stage III 06/19/2008  . BACK PAIN, LUMBAR 06/12/2008  . 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 GOUT 07/27/2006  . DVT 07/27/2006  . CORONARY ARTERY BYPASS GRAFT, HX OF 07/27/2006    ROS   Patient denies fever, chills, headache, sweats, rash, change in vision, change in hearing, chest pain, cough, nausea or vomiting, urinary symptoms. All other systems are reviewed and are negative.  PHYSICAL EXAM  Patient is oriented to person time and place. Affect is normal. He's here with his wife. There is no jugulovenous distention. Lungs are clear. Respiratory effort is nonlabored. Cardiac exam reveals S1 and S2. The abdomen is protuberant but soft. The patient has no edema in his left leg and 1+ edema in his right leg. He says that his right leg can frequently look much worse during the day.  Filed Vitals:   01/07/13 1357  BP: 82/54  Pulse: 75  Height:  6' (1.829 m)  Weight: 245 lb (111.131 kg)     ASSESSMENT & PLAN

## 2013-01-07 NOTE — Assessment & Plan Note (Signed)
He has had a dramatic improvement with this treatment. He is able to ambulate much better.

## 2013-01-07 NOTE — Assessment & Plan Note (Signed)
Chemistry lab is being checked today.

## 2013-01-07 NOTE — Assessment & Plan Note (Signed)
After his last visit there was significant review of his record. I found that it had been my intent to continue his Cozaar, but this had been stopped. Cozaar was restarted. His pressure is on the low side today but this does not correlate with his pressures at home. Chemistry will be checked today.

## 2013-01-07 NOTE — Assessment & Plan Note (Signed)
It is my understanding that he may need a generator upgrade over the next many months

## 2013-01-07 NOTE — Patient Instructions (Signed)
Your physician recommends that you return for lab work in: Today-We will call you with recommendations from today's visit after we receive the results of your lab work

## 2013-01-10 ENCOUNTER — Other Ambulatory Visit: Payer: Self-pay | Admitting: Internal Medicine

## 2013-01-10 MED ORDER — FUROSEMIDE 80 MG PO TABS: 80.0000 mg | ORAL_TABLET | Freq: Two times a day (BID) | ORAL | Status: DC

## 2013-01-10 MED ORDER — CARVEDILOL 25 MG PO TABS: 12.5000 mg | ORAL_TABLET | Freq: Two times a day (BID) | ORAL | Status: DC

## 2013-01-10 MED ORDER — CHLORTHALIDONE 25 MG PO TABS: ORAL_TABLET | ORAL | Status: DC

## 2013-01-10 NOTE — Telephone Encounter (Signed)
Follow Up:  Pt's wife states she is returning Lynn's call from a couple days back. Pt's wife states she would like a call back asap

## 2013-01-10 NOTE — Telephone Encounter (Signed)
**Note De-Identified Nilan Iddings Obfuscation** Weight= 241 lbs.  The pts wife, [name], reports no changes in the pts edema or SOB at this time.

## 2013-01-11 ENCOUNTER — Ambulatory Visit (INDEPENDENT_AMBULATORY_CARE_PROVIDER_SITE_OTHER): Payer: Medicare Other | Admitting: Endocrinology

## 2013-01-11 ENCOUNTER — Encounter: Payer: Self-pay | Admitting: Endocrinology

## 2013-01-11 VITALS — BP 100/54 | HR 91 | Temp 97.5°F | Ht 71.5 in | Wt 258.0 lb

## 2013-01-11 DIAGNOSIS — E1142 Type 2 diabetes mellitus with diabetic polyneuropathy: Secondary | ICD-10-CM

## 2013-01-11 NOTE — Patient Instructions (Addendum)
Please check blood sugars at least half the time about 2 hours after any meal and as directed on waking up.   Please bring blood sugar monitor to each visit  NOVOLOG insulin: This should be taken just before eating  Start with 6 units before lunch and supper. The blood sugar about 2 hours after should be at least under 200, preferably 140-180 range If the blood sugar is higher may adjust the dose the next day by 2 units  Continue same Levemir insulin. If the blood sugar in the morning is still over 150 after a week may increase the dose to 38 units

## 2013-01-11 NOTE — Progress Notes (Signed)
Patient ID: [name], male   DOB: Aug 13, 1940, 72 y.o.   MRN: [id number]   Reason for Appointment : Consultation for Type 2 Diabetes  History of Present Illness          Diagnosis: Type 2 diabetes mellitus, date of diagnosis: 2008       Past history: He was initially treated with Metformin and also given Amayl at some point. With this he had fair control of his diabetes with A1c had usually been over 7% except once in 2013. He also has had minimal diabetes education  Recent history: He was taken off metformin probably in March this year after a hospitalization, possibly because of renal dysfunction He was started on Levemir insulin in May when his blood sugars were significantly higher. His average blood sugar in the morning was about 220 and he was started on 12 units. This has been progressively increased and has been on 35 units at night since 12/20/12 He has not been on any other medications either orally or injectable for his diabetes Not clear why his blood sugars have been higher but his A1c in October is over 10%. Has not had any steroids or any unusual weight gain  INSULIN regimen is described as:  35 Levemir hs for 3 weeks  Glucose monitoring:  done one time a day         Glucometer: One Touch.  did not bring this for download.     Blood Glucose readings from recall: Recently about 160. Average blood sugar over the last month about 165       Hypoglycemia frequency: Never.           Self-care: The diet that the patient has been following is: Smaller portions  Meals: 3 meals per day.  Low fat, trying to eat more salads. Avoiding drinks with sugar and no juices       Physical activity: exercise: Minimal, limited by back pain           Dietician visit: Most recent: Unknown.                Compliance with the medical regimen: Good  Retinal exam: Most recent: 6/14, reportedly normal   Wt Readings from Last 3 Encounters:  01/11/13 258 lb (117.028 kg)  01/07/13 245 lb (111.131  kg)  12/20/12 245 lb 6.4 oz (111.313 kg)    Lab Results  Component Value Date   HGBA1C 10.2* 12/19/2012   HGBA1C 7.4* 08/07/2012   HGBA1C 8.0* 04/16/2012   Lab Results  Component Value Date   MICROALBUR 3.0* 01/11/2012   LDLCALC 78 08/06/2012   CREATININE 1.7* 01/07/2013       Medication List       This list is accurate as of: 01/11/13 11:35 AM.  Always use your most recent med list.               allopurinol 100 MG tablet  Commonly known as:  ZYLOPRIM  Take 1 tablet (100 mg total) by mouth daily.     amLODipine 5 MG tablet  Commonly known as:  NORVASC  Take 1 tablet (5 mg total) by mouth 2 (two) times daily.     aspirin 81 MG tablet  Take 81 mg by mouth once a week. sunday     atorvastatin 10 MG tablet  Commonly known as:  LIPITOR  TAKE ONE TABLET BY MOUTH EVERY DAY     carvedilol 25 MG tablet  Commonly known as:  COREG  Take 0.5 tablets (12.5 mg total) by mouth 2 (two) times daily with a meal.     chlorthalidone 25 MG tablet  Commonly known as:  HYGROTON  Take 1 tablet when your weight reaches 242 lbs. or greater     cholecalciferol 1000 UNITS tablet  Commonly known as:  VITAMIN D  Take 1,000 Units by mouth daily.     COLCRYS 0.6 MG tablet  Generic drug:  colchicine  TAKE AS DIRECTED     freestyle lancets  Test three times a day Dx 250.00 freestyle lite lancet     furosemide 80 MG tablet  Commonly known as:  LASIX  Take 1 tablet (80 mg total) by mouth 2 (two) times daily.     gabapentin 100 MG capsule  Commonly known as:  NEURONTIN  Take 1 capsule (100 mg total) by mouth 2 (two) times daily.     glucose blood test strip  Freestyle Lite test strips, Test blood sugar three times daily     Insulin Detemir 100 UNIT/ML Sopn  Commonly known as:  LEVEMIR FLEXPEN  Inject 12 Units into the skin daily.     Insulin Pen Needle 32G X 6 MM Misc  Commonly known as:  NOVOFINE  Use once daily with Levemir injection DX: 250.70     ipratropium 0.06 % nasal  spray  Commonly known as:  ATROVENT  Place 1 spray into the nose daily.     losartan 50 MG tablet  Commonly known as:  COZAAR  Take 1 tablet (50 mg total) by mouth daily.     nitroGLYCERIN 0.4 MG SL tablet  Commonly known as:  NITROSTAT  Place 1 tablet (0.4 mg total) under the tongue every 5 (five) minutes as needed.     potassium chloride SA 20 MEQ tablet  Commonly known as:  K-DUR,KLOR-CON  Take 1 tablet (20 mEq total) by mouth 2 (two) times daily.     warfarin 2.5 MG tablet  Commonly known as:  COUMADIN  Take as directed by Anticoagulation clinic        Allergies:  Allergies  Allergen Reactions  . Avelox [Moxifloxacin Hcl In Nacl] Swelling, Rash and Other (See Comments)    Patient became hypotensive after infusion started Because of a history of documented adverse serious drug reaction;Medi Alert bracelet  is recommended  . Penicillins     REACTION: anaphylaxis Because of a history of documented adverse serious drug reaction;Medi Alert bracelet  is recommended    Past Medical History  Diagnosis Date  . Diabetes mellitus   . Hyperlipidemia   . Hypertension   . Pilonidal cyst   . Atrial fibrillation     Previous long-term amiodarone therapy with multiple cardioversions / amiodarone stopped September, 2009  . Atrial flutter     Started November, 2010, Left-sided and cannot ablate  . Left atrial thrombus     Remote past... cardioversions done since that time  . Wide-complex tachycardia   . Left ventricular ejection fraction less than 40%   . Gout   . AAA (abdominal aortic aneurysm)     Surgical repair  . Discolored skin   . S/P ICD (internal cardiac defibrillator) procedure     Dr. Ladona Ridgel 2009... by the pacing  . SOB (shortness of breath)     Large left effusion/ thoracentesis/hospitalization/November, 2011... exudated.. cytology negative.. Dr.Wert.. no proof of mesothelioma  . Pericardial effusion   . Pleural effusion     Large loculated effusion on the left  side November, 2011. This was tapped. It was exudative. Cytology revealed no cancer no proof of mesothelioma area pulmonary team felt that no further workup was needed  . S/P AAA repair   . Spinal stenosis     Surgery Dr.Elsner  . CAD (coronary artery disease)     Catheterization July, 2008... name and vein grafts patent but low cardiac output  . Warfarin anticoagulation   . Cardiomyopathy     Ischemic... ICD  . Combined systolic and diastolic CHF   . Venous insufficiency     Toe discoloration chronic  . Mitral regurgitation     Mild echo  . Aortic valve sclerosis   . Nasal drainage     Chronic  . Alcohol ingestion of more than four drinks per week     Excess beer  not a dependency problem  . Chronotropic incompetence     IV pacing rate adjusted  . Thrombophlebitis of superficial veins of upper extremities     Possible venous stenosis from defibrillator  . Pericardial effusion     November, 2011 .. decreased during hospitalization  . Eye abnormality     Ophthalmologist questions a clot in one of his eyes, May, 2012  . Overweight(278.02)     November, 2012  . Pleural thickening   . Ejection fraction     Ejection fraction has varied over time from 35-50%.,, Echoes are technically very difficult,,, EF 50%, echo, May 25, 2011, technically very difficult  . Drug therapy     Redness and swelling with Avelox infusion May 24, 2011  . COPD (chronic obstructive pulmonary disease)   . Myocardial infarction   . Spinal cord stimulator status     October, 2013  . Carotid artery disease     Doppler, December, 2013, 0-39% bilateral  . Pacemaker   . Pneumonia   . Arthritis   . ICD (implantable cardiac defibrillator) in place   . Ventral hernia     April, 2014, result of his abdominal surgery  . Bony abnormality     Patient's manubrium is slightly displaced to the right    Past Surgical History  Procedure Laterality Date  . Colonoscopy w/ polypectomy    . Icd       insertion  .  Lumbar fusion    . Pilonidal cyst removal    . Surgery scrotal / testicular    . Coronary artery bypass graft  2004  . Abdominal aortic aneurysm repair  11/2002  . Insert / replace / remove pacemaker    . Back surgery    . Incision and drainage abscess / hematoma of bursa / knee / thigh      Family History  Problem Relation Age of Onset  . Hypertension Mother   . Stroke Mother   . Diabetes Father   . Coronary artery disease Father   . Other Father     DVT    Social History:  reports that he quit smoking about 17 years ago. He has quit using smokeless tobacco. He reports that he drinks about 1.2 ounces of alcohol per week. He reports that he does not use illicit drugs.    Review of Systems       Lipids: Treated with Lipitor, LDL controlled Lab Results  Component Value Date   LDLCALC 78 08/06/2012                  Skin: No rash or infections     Thyroid:  No  unusual fatigue.     The blood pressure has been under control with current regimen of chlorthalidone, amlodipine and losartan      He may have swelling of the right lower leg     No shortness of breath on exertion.     Bowel habits: Normal.       No excessive frequency of urination or nocturia       No joint  pains, does have low back pain      He has had pains and tingling in his feet and lower legs. These are worse in the last month with sharp pains and also some tenderness.   LABS:  Office Visit on 01/07/2013  Component Date Value Range Status  . Sodium 01/07/2013 137  135 - 145 mEq/L Final  . Potassium 01/07/2013 3.5  3.5 - 5.1 mEq/L Final  . Chloride 01/07/2013 93* 96 - 112 mEq/L Final  . CO2 01/07/2013 34* 19 - 32 mEq/L Final  . Glucose, Bld 01/07/2013 342* 70 - 99 mg/dL Final  . BUN 84/69/6295 35* 6 - 23 mg/dL Final  . Creatinine, Ser 01/07/2013 1.7* 0.4 - 1.5 mg/dL Final  . Calcium 28/41/3244 9.0  8.4 - 10.5 mg/dL Final  . GFR 03/23/7251 42.07* >60.00 mL/min Final  Anti-coag visit on 01/07/2013   Component Date Value Range Status  . INR 01/07/2013 2.2   Final    Physical Examination:  BP 100/54  Pulse 91  Temp(Src) 97.5 F (36.4 C) (Oral)  Ht 5' 11.5" (1.816 m)  Wt 258 lb (117.028 kg)  BMI 35.49 kg/m2  SpO2 98%  GENERAL:         Patient has generalized obesity.   HEENT:         Eye exam shows normal external appearance. Fundus exam shows no retinopathy. Oral exam shows normal mucosa .  NECK:         General:  Neck exam shows no lymphadenopathy. Carotids are normal to palpation and no bruit heard. Thyroid is not enlarged and no nodules felt.   LUNGS:         Chest is symmetrical. Lungs are clear to auscultation.Marland Kitchen   HEART:         Heart sounds:  heart rhythm is somewhat irregular. S1 and S2 are normal. No murmurs or clicks heard., no S3 or S4.   ABDOMEN:         General:  There is no distention present. Liver and spleen are not palpable. No other mass or tenderness present.  EXTREMITIES:     There is no edema. No skin lesions present.Marland Kitchen  NEUROLOGICAL:        Vibration sense is absent in toes. Ankle jerks are absent bilaterally.          Diabetic foot exam:  as in the foot exam section MUSCULOSKELETAL:       There is no enlargement or deformity of the joints. Feet are relatively flat especially right side Spine is normal to inspection.Marland Kitchen   PEDAL pulses: Absent SKIN:       No rash or abnormal pigmentation, mild dusky discoloration of hands and feet. Has a corn on the right bottom of the foot and mild callus formation on the  left distal foot    ASSESSMENT:  Diabetes type 2, uncontrolled - 250.02    His diabetes apparently has worsened significantly since he was taken off metformin earlier this year Also because of his difficulty with losing weight, inactivity he appears to be  significantly  insulin resistant His recent A1c was 10.2%. Discussed with the patient that since this represents an average blood sugar of about 245 he has most likely significant hyperglycemia after  meals Currently he is not monitoring blood sugars after meals at all His fasting blood sugars are improved with Levemir insulin but he has no mealtime coverage at this time Explained to the patient and his wife the actions of basal insulin Discussed need for mealtime coverage with a fast acting insulin and explained timing of insulin injection, duration of action, postprandial blood sugar targets and principles of dosage adjustment based on two-hour glucose  Complications: Peripheral neuropathy with sensory loss  PLAN:   He will start NOVOLOG insulin before his main meals which are lunch and supper. For now will start him on 6 units and he can adjust it upwards by 2 units until postprandial blood sugars at least under 200   He will need to take insulin right before eating even when eating out Sample and brochure on NovoLog given Consider adding small dose of NovoLog in the morning also if his reading after his daily V8 juice is higher also  Continue same Levemir insulin. If the blood sugar in the morning is still over 150 after a week may increase the dose to 38 units Consider adding Victoza if blood sugars are not well controlled and he starts gaining excessive weight   He will be followed up by nurse educator since he needs more detailed diabetes education especially with insulin therapy   Agree with his orthopedic surgeon that he should have diabetic shoes because of his neuropathy and sensory loss  Tarisa Paola 01/11/2013, 11:35 AM

## 2013-01-11 NOTE — Telephone Encounter (Signed)
Med filled.  

## 2013-01-11 NOTE — Telephone Encounter (Signed)
Weight= 238 lbs.  The pt reports no changes in his edema or SOB at this time.

## 2013-01-14 ENCOUNTER — Other Ambulatory Visit: Payer: Self-pay

## 2013-01-14 ENCOUNTER — Telehealth: Payer: Self-pay | Admitting: Endocrinology

## 2013-01-14 LAB — REMOTE ICD DEVICE
AL IMPEDENCE ICD: 532 Ohm
ATRIAL PACING ICD: 0 pct
BMOD-0005RV: 95 {beats}/min
CHARGE TIME: 12.402 s
FVT: 0
PACEART VT: 0
TOT-0001: 1
TOT-0002: 0
TZAT-0001SLOWVT: 2
TZAT-0002ATACH: NEGATIVE
TZAT-0002ATACH: NEGATIVE
TZAT-0002ATACH: NEGATIVE
TZAT-0004FASTVT: 8
TZAT-0004SLOWVT: 8
TZAT-0004SLOWVT: 8
TZAT-0005FASTVT: 88 pct
TZAT-0011FASTVT: 10 ms
TZAT-0011SLOWVT: 10 ms
TZAT-0011SLOWVT: 10 ms
TZAT-0012ATACH: 150 ms
TZAT-0012ATACH: 150 ms
TZAT-0012ATACH: 150 ms
TZAT-0012FASTVT: 200 ms
TZAT-0012SLOWVT: 200 ms
TZAT-0012SLOWVT: 200 ms
TZAT-0013FASTVT: 1
TZAT-0018FASTVT: NEGATIVE
TZAT-0019ATACH: 6 V
TZAT-0019ATACH: 6 V
TZAT-0019SLOWVT: 8 V
TZAT-0020ATACH: 1.5 ms
TZAT-0020ATACH: 1.5 ms
TZAT-0020ATACH: 1.5 ms
TZON-0003ATACH: 350 ms
TZON-0003FASTVT: 240 ms
TZON-0003SLOWVT: 340 ms
TZON-0003VSLOWVT: 450 ms
TZST-0001ATACH: 4
TZST-0001FASTVT: 2
TZST-0001FASTVT: 3
TZST-0001FASTVT: 4
TZST-0001FASTVT: 5
TZST-0001SLOWVT: 3
TZST-0001SLOWVT: 4
TZST-0001SLOWVT: 6
TZST-0002ATACH: NEGATIVE
TZST-0002ATACH: NEGATIVE
TZST-0003FASTVT: 35 J
TZST-0003FASTVT: 35 J
TZST-0003SLOWVT: 15 J
TZST-0003SLOWVT: 35 J
VENTRICULAR PACING ICD: 77.39 pct

## 2013-01-14 MED ORDER — FUROSEMIDE 40 MG PO TABS: 80.0000 mg | ORAL_TABLET | Freq: Two times a day (BID) | ORAL | Status: DC

## 2013-01-14 MED ORDER — CHLORTHALIDONE 25 MG PO TABS: 12.5000 mg | ORAL_TABLET | ORAL | Status: DC | PRN

## 2013-01-14 NOTE — Telephone Encounter (Signed)
Please see below and advise.

## 2013-01-14 NOTE — Telephone Encounter (Signed)
Pt wants to know what labs Dr. Lucianne Muss is ordering for next appt. They have some coming up and do not want to repeat labs. Pt also needs to sch a follow up appt. Please let me know about the labs and I will call patient / Sherri

## 2013-01-14 NOTE — Telephone Encounter (Signed)
Weight= 240 lbs  The pts wife states that the pts edema is much better since last OV with Dr Myrtis Ser and that he has no SOB at this time.

## 2013-01-15 ENCOUNTER — Other Ambulatory Visit: Payer: Self-pay | Admitting: Cardiology

## 2013-01-15 ENCOUNTER — Other Ambulatory Visit: Payer: Self-pay

## 2013-01-15 NOTE — Telephone Encounter (Signed)
**Note De-Identified [name] Obfuscation** Weight= 242 lbs.  The pts wife, [name], reports no changes in pts edema or SOB at this time. The pt took Chlorithadone dose due to weight of 242 lbs.

## 2013-01-17 ENCOUNTER — Ambulatory Visit (INDEPENDENT_AMBULATORY_CARE_PROVIDER_SITE_OTHER): Payer: Medicare Other | Admitting: *Deleted

## 2013-01-17 DIAGNOSIS — E876 Hypokalemia: Secondary | ICD-10-CM

## 2013-01-17 LAB — BASIC METABOLIC PANEL
BUN: 25 mg/dL — ABNORMAL HIGH (ref 6–23)
CO2: 27 mEq/L (ref 19–32)
Calcium: 9 mg/dL (ref 8.4–10.5)
Chloride: 102 mEq/L (ref 96–112)
GFR: 52.12 mL/min — ABNORMAL LOW (ref >60.00)
Potassium: 3.9 mEq/L (ref 3.5–5.1)
Sodium: 139 mEq/L (ref 135–145)

## 2013-01-18 NOTE — Telephone Encounter (Signed)
Weight= 240 lbs.  The pts wife, [name], reports no changes in pts edema or SOB at this time.

## 2013-01-18 NOTE — Telephone Encounter (Signed)
Labs are ordered 

## 2013-01-21 ENCOUNTER — Encounter: Payer: Self-pay | Admitting: Internal Medicine

## 2013-01-22 ENCOUNTER — Telehealth: Payer: Self-pay

## 2013-01-22 NOTE — Telephone Encounter (Signed)
**Note De-Identified Niesha Bame Obfuscation** Weight= 240 lbs.  The pts wife, [name], reports no changes in the pts edema or SOB at this time.

## 2013-01-24 NOTE — Telephone Encounter (Signed)
Weight= 242 lbs.  The pts wife, Darelle, states that the pt continues to have right leg edema and no SOB at this time. The pt took a Chlorthalidone dose today due to weight of 242 lbs.

## 2013-01-28 NOTE — Telephone Encounter (Addendum)
Weight= 242 lbs.  The pts wife, Nachman, states that the pt has no SOB at this time and no changes in the edema in his right leg. The pt took Chlorthalidone dose today due to weight of 242 lbs.

## 2013-01-29 ENCOUNTER — Ambulatory Visit: Payer: Medicare Other | Admitting: Nutrition

## 2013-01-29 NOTE — Telephone Encounter (Signed)
**Note De-identified [name] Obfuscation** CHF call:  LMTCB 

## 2013-01-30 ENCOUNTER — Encounter: Payer: Medicare Other | Attending: Endocrinology | Admitting: Nutrition

## 2013-01-30 DIAGNOSIS — E119 Type 2 diabetes mellitus without complications: Secondary | ICD-10-CM | POA: Insufficient documentation

## 2013-01-31 ENCOUNTER — Encounter: Payer: Self-pay | Admitting: *Deleted

## 2013-01-31 ENCOUNTER — Encounter: Payer: Self-pay | Admitting: Internal Medicine

## 2013-01-31 ENCOUNTER — Ambulatory Visit (INDEPENDENT_AMBULATORY_CARE_PROVIDER_SITE_OTHER): Payer: Medicare Other | Admitting: Internal Medicine

## 2013-01-31 ENCOUNTER — Ambulatory Visit (INDEPENDENT_AMBULATORY_CARE_PROVIDER_SITE_OTHER): Payer: Medicare Other | Admitting: Pharmacist

## 2013-01-31 VITALS — BP 112/75 | HR 73 | Ht 72.0 in | Wt 250.1 lb

## 2013-01-31 DIAGNOSIS — Z95810 Presence of automatic (implantable) cardiac defibrillator: Secondary | ICD-10-CM

## 2013-01-31 DIAGNOSIS — I509 Heart failure, unspecified: Secondary | ICD-10-CM

## 2013-01-31 DIAGNOSIS — I4891 Unspecified atrial fibrillation: Secondary | ICD-10-CM

## 2013-01-31 DIAGNOSIS — Z7901 Long term (current) use of anticoagulants: Secondary | ICD-10-CM

## 2013-01-31 DIAGNOSIS — I428 Other cardiomyopathies: Secondary | ICD-10-CM

## 2013-01-31 LAB — CBC WITH DIFFERENTIAL/PLATELET
Basophils Absolute: 0.1 10*3/uL (ref 0.0–0.1)
Eosinophils Relative: 2.6 % (ref 0.0–5.0)
HCT: 44.1 % (ref 39.0–52.0)
Lymphocytes Relative: 20.8 % (ref 12.0–46.0)
MCHC: 34.2 g/dL (ref 30.0–36.0)
MCV: 90.7 fl (ref 78.0–100.0)
Monocytes Absolute: 0.9 10*3/uL (ref 0.1–1.0)
Monocytes Relative: 12.2 % — ABNORMAL HIGH (ref 3.0–12.0)
Neutrophils Relative %: 63.7 % (ref 43.0–77.0)
Platelets: 179 10*3/uL (ref 150.0–400.0)
RDW: 14.9 % — ABNORMAL HIGH (ref 11.5–14.6)
WBC: 7.5 10*3/uL (ref 4.5–10.5)

## 2013-01-31 LAB — BASIC METABOLIC PANEL
BUN: 29 mg/dL — ABNORMAL HIGH (ref 6–23)
CO2: 32 mEq/L (ref 19–32)
Calcium: 9.3 mg/dL (ref 8.4–10.5)
Creatinine, Ser: 1.6 mg/dL — ABNORMAL HIGH (ref 0.4–1.5)
GFR: 47.11 mL/min — ABNORMAL LOW (ref >60.00)
Glucose, Bld: 165 mg/dL — ABNORMAL HIGH (ref 70–99)
Potassium: 3.4 mEq/L — ABNORMAL LOW (ref 3.5–5.1)
Sodium: 137 mEq/L (ref 135–145)

## 2013-01-31 NOTE — Assessment & Plan Note (Addendum)
His device is at [facility]. I have discussed the indications/risks/benefits/goals/expectations of ICD generator removal and insertion of a new device and he wishes to proceed. Prior to scheduling this procedure, we will repeat his 2-D echo. His left ventricular function had improved on echo approximately 18 months ago. If his left ventricular function remains improved, I would anticipate placement of a pacemaker as the patient has a history of bradycardia with appropriate ventricular pacing with underlying atrial fibrillation.

## 2013-01-31 NOTE — Patient Instructions (Addendum)
Your physician has requested that you have an echocardiogram. Echocardiography is a painless test that uses sound waves to create images of your heart. It provides your doctor with information about the size and shape of your heart and how well your heart's chambers and valves are working. This procedure takes approximately one hour. There are no restrictions for this procedure.  Will schedule generator change out after echo  See instruction sheet

## 2013-01-31 NOTE — Assessment & Plan Note (Signed)
His heart failure symptoms are currently class II. His left ventricular function had improved. If it remains improved, we would expect to place a pacemaker rather than a defibrillator.

## 2013-01-31 NOTE — Progress Notes (Signed)
HPI [name] returns today for ICD followup. He is a pleasant 72 yo man with an ICM, chronic systolic heart failure, s/p ICD implant. He has chronic atrial fibrillation.   The patient has a chronically elevated left ventricular pacing threshold. He has a history of gout, and is bothered with his diet is high in protein. He has mild peripheral edema. No ICD shock. No syncope. He has a spinal stimulator which has improved his back pain markedly. Back in 2013, repeat echo demonstrated that his LV function had improved. He does pace for bradycardia. Allergies  Allergen Reactions  . Avelox [Moxifloxacin Hcl In Nacl] Swelling, Rash and Other (See Comments)    Patient became hypotensive after infusion started Because of a history of documented adverse serious drug reaction;Medi Alert bracelet  is recommended  . Penicillins     REACTION: anaphylaxis Because of a history of documented adverse serious drug reaction;Medi Alert bracelet  is recommended     Current Outpatient Prescriptions  Medication Sig Dispense Refill  . allopurinol (ZYLOPRIM) 100 MG tablet Take 1 tablet (100 mg total) by mouth daily.  30 tablet  5  . amLODipine (NORVASC) 5 MG tablet Take 1 tablet (5 mg total) by mouth 2 (two) times daily.  180 tablet  3  . aspirin 81 MG tablet Take 81 mg by mouth once a week. sunday      . atorvastatin (LIPITOR) 10 MG tablet TAKE ONE TABLET BY MOUTH ONCE DAILY  90 tablet  0  . carvedilol (COREG) 25 MG tablet Take 0.5 tablets (12.5 mg total) by mouth 2 (two) times daily with a meal.  90 tablet  1  . chlorthalidone (HYGROTON) 25 MG tablet Take 0.5 tablets (12.5 mg total) by mouth as needed. Take 12.5 mg when weight reaches 242 lbs or greater.  30 tablet  3  . cholecalciferol (VITAMIN D) 1000 UNITS tablet Take 1,000 Units by mouth daily.      . COLCRYS 0.6 MG tablet TAKE AS DIRECTED  100 tablet  0  . furosemide (LASIX) 40 MG tablet Take 2 tablets (80 mg total) by mouth 2 (two) times daily.  360 tablet   3  . gabapentin (NEURONTIN) 100 MG capsule Take 1 capsule (100 mg total) by mouth 2 (two) times daily.  180 capsule  1  . glucose blood test strip Freestyle Lite test strips, Test blood sugar three times daily  300 each  9  . Insulin Aspart (NOVOLOG FLEXPEN [place]) Inject 10 Units into the skin as directed.      . Insulin Detemir (LEVEMIR FLEXPEN) 100 UNIT/ML SOPN Inject 12 Units into the skin daily.  3 mL  5  . ipratropium (ATROVENT) 0.06 % nasal spray Place 1 spray into the nose daily.       . Lancets (FREESTYLE) lancets Test three times a day Dx 250.00 freestyle lite lancet  300 each  6  . losartan (COZAAR) 50 MG tablet Take 1 tablet (50 mg total) by mouth daily.  90 tablet  1  . nitroGLYCERIN (NITROSTAT) 0.4 MG SL tablet Place 1 tablet (0.4 mg total) under the tongue every 5 (five) minutes as needed.  25 tablet  3  . potassium chloride SA (K-DUR,KLOR-CON) 20 MEQ tablet Take 1 tablet (20 mEq total) by mouth 2 (two) times daily.  60 tablet  5  . warfarin (COUMADIN) 2.5 MG tablet Take as directed by Anticoagulation clinic  120 tablet  1  . Insulin Pen Needle (NOVOFINE) 32G X 6   MM MISC Use once daily with Levemir injection DX: 250.70  100 each  3   No current facility-administered medications for this visit.     Past Medical History  Diagnosis Date  . Diabetes mellitus   . Hyperlipidemia   . Hypertension   . Pilonidal cyst   . Atrial fibrillation     Previous long-term amiodarone therapy with multiple cardioversions / amiodarone stopped September, 2009  . Atrial flutter     Started November, 2010, Left-sided and cannot ablate  . Left atrial thrombus     Remote past... cardioversions done since that time  . Wide-complex tachycardia   . Left ventricular ejection fraction less than 40%   . Gout   . AAA (abdominal aortic aneurysm)     Surgical repair  . Discolored skin   . S/P ICD (internal cardiac defibrillator) procedure     Dr. Essance Gatti 2009... by the pacing  . SOB (shortness of breath)      Large left effusion/ thoracentesis/hospitalization/November, 2011... exudated.. cytology negative.. Dr.Wert.. no proof of mesothelioma  . Pericardial effusion   . Pleural effusion     Large loculated effusion on the left side November, 2011. This was tapped. It was exudative. Cytology revealed no cancer no proof of mesothelioma area pulmonary team felt that no further workup was needed  . S/P AAA repair   . Spinal stenosis     Surgery Dr.Elsner  . CAD (coronary artery disease)     Catheterization July, 2008... name and vein grafts patent but low cardiac output  . Warfarin anticoagulation   . Cardiomyopathy     Ischemic... ICD  . Combined systolic and diastolic CHF   . Venous insufficiency     Toe discoloration chronic  . Mitral regurgitation     Mild echo  . Aortic valve sclerosis   . Nasal drainage     Chronic  . Alcohol ingestion of more than four drinks per week     Excess beer  not a dependency problem  . Chronotropic incompetence     IV pacing rate adjusted  . Thrombophlebitis of superficial veins of upper extremities     Possible venous stenosis from defibrillator  . Pericardial effusion     November, 2011 .. decreased during hospitalization  . Eye abnormality     Ophthalmologist questions a clot in one of his eyes, May, 2012  . Overweight(278.02)     November, 2012  . Pleural thickening   . Ejection fraction     Ejection fraction has varied over time from 35-50%.,, Echoes are technically very difficult,,, EF 50%, echo, May 25, 2011, technically very difficult  . Drug therapy     Redness and swelling with Avelox infusion May 24, 2011  . COPD (chronic obstructive pulmonary disease)   . Myocardial infarction   . Spinal cord stimulator status     October, 2013  . Carotid artery disease     Doppler, December, 2013, 0-39% bilateral  . Pacemaker   . Pneumonia   . Arthritis   . ICD (implantable cardiac defibrillator) in place   . Ventral hernia     April, 2014,  result of his abdominal surgery  . Bony abnormality     Patient's manubrium is slightly displaced to the right    ROS:   All systems reviewed and negative except as noted in the HPI.   Past Surgical History  Procedure Laterality Date  . Colonoscopy w/ polypectomy    . Icd         insertion  . Lumbar fusion    . Pilonidal cyst removal    . Surgery scrotal / testicular    . Coronary artery bypass graft  2004  . Abdominal aortic aneurysm repair  11/2002  . Insert / replace / remove pacemaker    . Back surgery    . Incision and drainage abscess / hematoma of bursa / knee / thigh       Family History  Problem Relation Age of Onset  . Hypertension Mother   . Stroke Mother   . Diabetes Father   . Coronary artery disease Father   . Other Father     DVT     History   Social History  . Marital Status: Married    Spouse Name: N/A    Number of Children: N/A  . Years of Education: N/A   Occupational History  . Not on file.   Social History Main Topics  . Smoking status: Former Smoker    Quit date: 03/22/1995  . Smokeless tobacco: Former User  . Alcohol Use: 1.2 oz/week    2 Cans of beer per week     Comment: beer  . Drug Use: No  . Sexual Activity: No   Other Topics Concern  . Not on file   Social History Narrative  . No narrative on file     BP 112/75  Pulse 73  Ht 6' (1.829 m)  Wt 250 lb 1.9 oz (113.454 kg)  BMI 33.92 kg/m2  Physical Exam:  Well appearing 72-year-old man, NAD HEENT: Unremarkable Neck:  7cm JVD, no thyromegally Lungs:  Clear except for minimal basilar rales. No wheezes, no rhonchi. HEART:  Regular rate rhythm, no murmurs, no rubs, no clicks Abd:  soft, positive bowel sounds, no organomegally, no rebound, no guarding Ext:  2 plus pulses, no edema, no cyanosis, no clubbing Skin:  No rashes no nodules Neuro:  CN II through XII intact, motor grossly intact  DEVICE  Normal device function.  See PaceArt for details. Device is at  [facility]  Assess/Plan: 

## 2013-02-01 ENCOUNTER — Other Ambulatory Visit (INDEPENDENT_AMBULATORY_CARE_PROVIDER_SITE_OTHER): Payer: Medicare Other

## 2013-02-01 DIAGNOSIS — IMO0001 Reserved for inherently not codable concepts without codable children: Secondary | ICD-10-CM

## 2013-02-01 LAB — BASIC METABOLIC PANEL
CO2: 29 mEq/L (ref 19–32)
Chloride: 98 mEq/L (ref 96–112)
Glucose, Bld: 140 mg/dL — ABNORMAL HIGH (ref 70–99)
Potassium: 3 mEq/L — ABNORMAL LOW (ref 3.5–5.1)
Sodium: 138 mEq/L (ref 135–145)

## 2013-02-01 LAB — MICROALBUMIN / CREATININE URINE RATIO
Microalb Creat Ratio: 1.2 mg/g (ref 0.0–30.0)
Microalb, Ur: 0.5 mg/dL (ref 0.0–1.9)

## 2013-02-01 LAB — URINALYSIS, ROUTINE W REFLEX MICROSCOPIC
Bilirubin Urine: NEGATIVE
Hgb urine dipstick: NEGATIVE
Nitrite: NEGATIVE
Total Protein, Urine: NEGATIVE
Urine Glucose: NEGATIVE

## 2013-02-01 NOTE — Telephone Encounter (Signed)
LMTCB

## 2013-02-02 LAB — FRUCTOSAMINE: Fructosamine: 309 umol/L — ABNORMAL HIGH (ref <285)

## 2013-02-04 ENCOUNTER — Encounter: Payer: Self-pay | Admitting: Nutrition

## 2013-02-04 NOTE — Patient Instructions (Signed)
Increase Levemir dose to 37 units Take 4u of Novolog whenever eating a light meal in the place of a regular meal.

## 2013-02-04 NOTE — Progress Notes (Signed)
Pt. Here with son.  He reports taking Levemir 35u q HS, around 10 PM.   He also reports taking Novolog 10u ac meals--Blood sugar record shows that he does not always take this Novolog .  When this happens, his blood sugars are high 2 hrs. Afterwards (200s).    He was shown this and reports that some times he is out, and does not always remember to bring his insulin.  He also sometimes thinks that he will not eat, so does not take the Novolog.  He will then snack, causing the blood sugars to go up.   SBGM:  Pt. Is testing acB and 2hr. pcL and 2hr. PcS.  FBS last 5 days:  132-161.  2hr. PcL: 1355-208, 2hr. PcS: 153-192.  PT. Denies any low  Blood sugars. Diet appears balanced, with approx 2-3 servings of carbs at each meal.  No diet changes recommended. Discussed the importance of taking some Novolog when eating a snack in the place of a meal.  Suggested 4u for some peanut butter crackers and a drink.  He agreed to do this.   Per Dr. Remus Blake order, the Levemir dose was increased to 37u.  Written instructions were given of this, to patient.

## 2013-02-05 ENCOUNTER — Ambulatory Visit (HOSPITAL_COMMUNITY): Payer: Medicare Other | Attending: Internal Medicine | Admitting: Cardiology

## 2013-02-05 DIAGNOSIS — J449 Chronic obstructive pulmonary disease, unspecified: Secondary | ICD-10-CM | POA: Insufficient documentation

## 2013-02-05 DIAGNOSIS — J4489 Other specified chronic obstructive pulmonary disease: Secondary | ICD-10-CM | POA: Insufficient documentation

## 2013-02-05 DIAGNOSIS — I428 Other cardiomyopathies: Secondary | ICD-10-CM

## 2013-02-05 DIAGNOSIS — Z95810 Presence of automatic (implantable) cardiac defibrillator: Secondary | ICD-10-CM | POA: Insufficient documentation

## 2013-02-05 DIAGNOSIS — E119 Type 2 diabetes mellitus without complications: Secondary | ICD-10-CM | POA: Insufficient documentation

## 2013-02-05 DIAGNOSIS — E785 Hyperlipidemia, unspecified: Secondary | ICD-10-CM | POA: Insufficient documentation

## 2013-02-05 DIAGNOSIS — I10 Essential (primary) hypertension: Secondary | ICD-10-CM | POA: Insufficient documentation

## 2013-02-05 DIAGNOSIS — I429 Cardiomyopathy, unspecified: Secondary | ICD-10-CM

## 2013-02-05 NOTE — Telephone Encounter (Signed)
**Note De-Identified [name] Obfuscation** Weight= 240 lbs.  Pts wife reports no change in pts edema or SOB at this time.

## 2013-02-05 NOTE — Progress Notes (Signed)
Echo performed. 

## 2013-02-05 NOTE — Telephone Encounter (Signed)
Follow up  Pt requests a call back// No further details

## 2013-02-06 ENCOUNTER — Encounter (HOSPITAL_COMMUNITY): Payer: Self-pay | Admitting: Pharmacy Technician

## 2013-02-06 NOTE — Telephone Encounter (Signed)
**Note De-Identified [name] Obfuscation** Weight= 242 lbs.  The pt states that the edema in his right leg is unchanged and that he has no SOB at this time. The pt took Chlorthalidone 25 mg today due to weight of 242 lbs.

## 2013-02-07 ENCOUNTER — Telehealth: Payer: Self-pay | Admitting: Internal Medicine

## 2013-02-07 NOTE — Telephone Encounter (Signed)
Confirmed recall in 2016 with patient's wife.

## 2013-02-08 ENCOUNTER — Encounter: Payer: Self-pay | Admitting: Endocrinology

## 2013-02-08 ENCOUNTER — Ambulatory Visit (INDEPENDENT_AMBULATORY_CARE_PROVIDER_SITE_OTHER): Payer: Medicare Other | Admitting: Endocrinology

## 2013-02-08 VITALS — BP 116/68 | HR 82 | Temp 98.5°F | Resp 12 | Ht 72.0 in | Wt 249.0 lb

## 2013-02-08 DIAGNOSIS — E1159 Type 2 diabetes mellitus with other circulatory complications: Secondary | ICD-10-CM

## 2013-02-08 DIAGNOSIS — E876 Hypokalemia: Secondary | ICD-10-CM

## 2013-02-08 DIAGNOSIS — I798 Other disorders of arteries, arterioles and capillaries in diseases classified elsewhere: Secondary | ICD-10-CM

## 2013-02-08 DIAGNOSIS — E1151 Type 2 diabetes mellitus with diabetic peripheral angiopathy without gangrene: Secondary | ICD-10-CM

## 2013-02-08 DIAGNOSIS — E1142 Type 2 diabetes mellitus with diabetic polyneuropathy: Secondary | ICD-10-CM

## 2013-02-08 NOTE — Telephone Encounter (Signed)
**Note De-Identified [name] Obfuscation** Weight= 239 lbs.  Pts wife, [name], states that the pt continues to have edema in his right leg and no SOB at this time.

## 2013-02-08 NOTE — Patient Instructions (Signed)
8-12 Novolog before meals based on meal size and Carb amounts  Keep am Sugar 90-140 range

## 2013-02-08 NOTE — Progress Notes (Signed)
Patient ID: [name], male   DOB: 03-Oct-1940, 72 y.o.   MRN: [id number]  Reason for Appointment : F/u for Type 2 Diabetes  History of Present Illness          Diagnosis: Type 2 diabetes mellitus, date of diagnosis: 2008       Past history: He was initially treated with Metformin and also given Amayl at some point. With this he had fair control of his diabetes with A1c had usually been over 7% except once in 2013.  He was taken off metformin probably in March this year after a hospitalization, possibly because of renal dysfunction He was started on Levemir insulin in May when his blood sugars were significantly higher, fasting readings averaging 220.  Levemir progressively increased and was on 35 units at night since 12/20/12 He has not been on any other medications either orally or injectable for his diabetes  Recent history:    Because of his A1c of over 10% in 10/14 on basal insulin alone he was started on NovoLog with each meal in 10/14 He was instructed by the diabetes nurse educator recently and he was instructed on how to titrate his NovoLog to keep his postprandial readings at least below 200. He has increased his NovoLog from a starting dose of 6 units to 12 units with significant improvement in his postprandial readings Still has some high readings after supper and lunch based on what he is eating. Lowest postprandial reading was 114 with eating salad and little carbohydrate Levemir dosage was increased by 2 units and fasting readings are near normal now  INSULIN regimen is described as:  37 Levemir hs NovoLog 12 units ac  Glucose monitoring:  done 3 time a day         Glucometer:  FreeStyle.     Blood Glucose readings from download: FASTING recently 107-143 P.c. breakfast 135-226, mostly checked at lunchtime P.c. lunch 120-174 and p.c. supper 114-241. Overall average 167      Hypoglycemia frequency:  none.           Self-care: The diet that the patient has been following  is: Smaller portions  Meals: 3 meals per day.  Low fat, trying to eat more salads. Avoiding drinks with sugar and no juices       Physical activity: exercise: Minimal, limited by back pain           Dietician visit: Most recent: Unknown.  CDE visit: 11/14               Compliance with the medical regimen: Good  Retinal exam: Most recent: 6/14, reportedly normal  Weight control: This appears to be fluctuating significantly  Wt Readings from Last 3 Encounters:  02/08/13 249 lb (112.946 kg)  01/31/13 250 lb 1.9 oz (113.454 kg)  01/11/13 258 lb (117.028 kg)    Lab Results  Component Value Date   HGBA1C 10.2* 12/19/2012   HGBA1C 7.4* 08/07/2012   HGBA1C 8.0* 04/16/2012   Lab Results  Component Value Date   MICROALBUR 0.5 02/01/2013   LDLCALC 78 08/06/2012   CREATININE 1.4 02/01/2013       Medication List       This list is accurate as of: 02/08/13 11:59 PM.  Always use your most recent med list.               allopurinol 100 MG tablet  Commonly known as:  ZYLOPRIM  Take 1 tablet (100 mg total) by mouth daily.  amLODipine 5 MG tablet  Commonly known as:  NORVASC  Take 1 tablet (5 mg total) by mouth 2 (two) times daily.     aspirin 81 MG tablet  Take 81 mg by mouth once a week. sunday     atorvastatin 10 MG tablet  Commonly known as:  LIPITOR  Take 10 mg by mouth daily.     carvedilol 25 MG tablet  Commonly known as:  COREG  Take 0.5 tablets (12.5 mg total) by mouth 2 (two) times daily with a meal.     chlorthalidone 25 MG tablet  Commonly known as:  HYGROTON  Take 0.5 tablets (12.5 mg total) by mouth as needed. Take 12.5 mg when weight reaches 242 lbs or greater.     cholecalciferol 1000 UNITS tablet  Commonly known as:  VITAMIN D  Take 1,000 Units by mouth daily.     colchicine 0.6 MG tablet  Take 0.6 mg by mouth daily as needed (Gout).     freestyle lancets     FREESTYLE LITE TEST VI     furosemide 40 MG tablet  Commonly known as:  LASIX  Take 2  tablets (80 mg total) by mouth 2 (two) times daily.     gabapentin 100 MG capsule  Commonly known as:  NEURONTIN  Take 1 capsule (100 mg total) by mouth 2 (two) times daily.     Insulin Pen Needle 32G X 6 MM Misc  Commonly known as:  NOVOFINE  Use once daily with Levemir injection DX: 250.70     ipratropium 0.06 % nasal spray  Commonly known as:  ATROVENT  Place 1 spray into the nose daily.     LEVEMIR FLEXTOUCH 100 UNIT/ML Sopn  Generic drug:  Insulin Detemir  Inject 37 Units into the skin at bedtime.     losartan 50 MG tablet  Commonly known as:  COZAAR  Take 1 tablet (50 mg total) by mouth daily.     naphazoline-pheniramine 0.025-0.3 % ophthalmic solution  Commonly known as:  NAPHCON-A  Place 1 drop into both eyes daily as needed for irritation.     nitroGLYCERIN 0.4 MG SL tablet  Commonly known as:  NITROSTAT  Place 1 tablet (0.4 mg total) under the tongue every 5 (five) minutes as needed.     NOVOLOG FLEXPEN [place]  Inject 12 Units into the skin See admin instructions. With all meals through out the day.     potassium chloride SA 20 MEQ tablet  Commonly known as:  K-DUR,KLOR-CON  Take 1 tablet (20 mEq total) by mouth 2 (two) times daily.     warfarin 2.5 MG tablet  Commonly known as:  COUMADIN  Take 2.5-5 mg by mouth daily. Take 2.5mg  every day of the week except on Wednesday take 5mg .     ZOSTAVAX 33295 UNT/0.65ML injection  Generic drug:  zoster vaccine live (PF)        Allergies:  Allergies  Allergen Reactions  . Avelox [Moxifloxacin Hcl In Nacl] Swelling, Rash and Other (See Comments)    Patient became hypotensive after infusion started Because of a history of documented adverse serious drug reaction;Medi Alert bracelet  is recommended  . Penicillins     REACTION: anaphylaxis Because of a history of documented adverse serious drug reaction;Medi Alert bracelet  is recommended    Past Medical History  Diagnosis Date  . Diabetes mellitus   .  Hyperlipidemia   . Hypertension   . Pilonidal cyst   . Atrial fibrillation  Previous long-term amiodarone therapy with multiple cardioversions / amiodarone stopped September, 2009  . Atrial flutter     Started November, 2010, Left-sided and cannot ablate  . Left atrial thrombus     Remote past... cardioversions done since that time  . Wide-complex tachycardia   . Left ventricular ejection fraction less than 40%   . Gout   . AAA (abdominal aortic aneurysm)     Surgical repair  . Discolored skin   . S/P ICD (internal cardiac defibrillator) procedure     Dr. Ladona Ridgel 2009... by the pacing  . SOB (shortness of breath)     Large left effusion/ thoracentesis/hospitalization/November, 2011... exudated.. cytology negative.. Dr.Wert.. no proof of mesothelioma  . Pericardial effusion   . Pleural effusion     Large loculated effusion on the left side November, 2011. This was tapped. It was exudative. Cytology revealed no cancer no proof of mesothelioma area pulmonary team felt that no further workup was needed  . S/P AAA repair   . Spinal stenosis     Surgery Dr.Elsner  . CAD (coronary artery disease)     Catheterization July, 2008... name and vein grafts patent but low cardiac output  . Warfarin anticoagulation   . Cardiomyopathy     Ischemic... ICD  . Combined systolic and diastolic CHF   . Venous insufficiency     Toe discoloration chronic  . Mitral regurgitation     Mild echo  . Aortic valve sclerosis   . Nasal drainage     Chronic  . Alcohol ingestion of more than four drinks per week     Excess beer  not a dependency problem  . Chronotropic incompetence     IV pacing rate adjusted  . Thrombophlebitis of superficial veins of upper extremities     Possible venous stenosis from defibrillator  . Pericardial effusion     November, 2011 .. decreased during hospitalization  . Eye abnormality     Ophthalmologist questions a clot in one of his eyes, May, 2012  . Overweight(278.02)      November, 2012  . Pleural thickening   . Ejection fraction     Ejection fraction has varied over time from 35-50%.,, Echoes are technically very difficult,,, EF 50%, echo, May 25, 2011, technically very difficult  . Drug therapy     Redness and swelling with Avelox infusion May 24, 2011  . COPD (chronic obstructive pulmonary disease)   . Myocardial infarction   . Spinal cord stimulator status     October, 2013  . Carotid artery disease     Doppler, December, 2013, 0-39% bilateral  . Pacemaker   . Pneumonia   . Arthritis   . ICD (implantable cardiac defibrillator) in place   . Ventral hernia     April, 2014, result of his abdominal surgery  . Bony abnormality     Patient's manubrium is slightly displaced to the right    Past Surgical History  Procedure Laterality Date  . Colonoscopy w/ polypectomy    . Icd       insertion  . Lumbar fusion    . Pilonidal cyst removal    . Surgery scrotal / testicular    . Coronary artery bypass graft  2004  . Abdominal aortic aneurysm repair  11/2002  . Insert / replace / remove pacemaker    . Back surgery    . Incision and drainage abscess / hematoma of bursa / knee / thigh      Family  History  Problem Relation Age of Onset  . Hypertension Mother   . Stroke Mother   . Diabetes Father   . Coronary artery disease Father   . Other Father     DVT    Social History:  reports that he quit smoking about 17 years ago. He has quit using smokeless tobacco. He reports that he drinks about 1.2 ounces of alcohol per week. He reports that he does not use illicit drugs.    Review of Systems       Lipids: Treated with Lipitor, LDL controlled Lab Results  Component Value Date   LDLCALC 78 08/06/2012                  The blood pressure has been under control with current regimen of chlorthalidone, amlodipine and losartan      He has had pains and tingling in his feet and lower legs. Has known neuropathy  HYPOKALEMIA: He is already on 20  mg twice a day of potassium but is also on furosemide on and off and chlorthalidone. No muscle cramps   LABS:  No visits with results within 1 Week(s) from this visit. Latest known visit with results is:  Appointment on 02/01/2013  Component Date Value Range Status  . Fructosamine 02/01/2013 309* <285 umol/L Final   Comment:                            Variations in levels of serum proteins (albumin and immunoglobulins)                          may affect fructosamine results.                             Alyson Reedy, Ur 02/01/2013 0.5  0.0 - 1.9 mg/dL Final  . Creatinine,U 16/12/9602 41.7   Final  . Microalb Creat Ratio 02/01/2013 1.2  0.0 - 30.0 mg/g Final  . Color, Urine 02/01/2013 LT. YELLOW  Yellow;Lt. Yellow Final  . APPearance 02/01/2013 CLEAR  Clear Final  . Specific Gravity, Urine 02/01/2013 1.010  1.000-1.030 Final  . pH 02/01/2013 7.0  5.0 - 8.0 Final  . Total Protein, Urine 02/01/2013 NEGATIVE  Negative Final  . Urine Glucose 02/01/2013 NEGATIVE  Negative Final  . Ketones, ur 02/01/2013 NEGATIVE  Negative Final  . Bilirubin Urine 02/01/2013 NEGATIVE  Negative Final  . Hgb urine dipstick 02/01/2013 NEGATIVE  Negative Final  . Urobilinogen, UA 02/01/2013 0.2  0.0 - 1.0 Final  . Leukocytes, UA 02/01/2013 NEGATIVE  Negative Final  . Nitrite 02/01/2013 NEGATIVE  Negative Final  . WBC, UA 02/01/2013 0-2/hpf  0-2/hpf Final  . RBC / HPF 02/01/2013 0-2/hpf  0-2/hpf Final  . Squamous Epithelial / LPF 02/01/2013 Rare(0-4/hpf)  Rare(0-4/hpf) Final  . Sodium 02/01/2013 138  135 - 145 mEq/L Final  . Potassium 02/01/2013 3.0* 3.5 - 5.1 mEq/L Final  . Chloride 02/01/2013 98  96 - 112 mEq/L Final  . CO2 02/01/2013 29  19 - 32 mEq/L Final  . Glucose, Bld 02/01/2013 140* 70 - 99 mg/dL Final  . BUN 54/11/8117 25* 6 - 23 mg/dL Final  . Creatinine, Ser 02/01/2013 1.4  0.4 - 1.5 mg/dL Final  . Calcium 14/78/2956 9.6  8.4 - 10.5 mg/dL Final  . GFR 21/30/8657 51.70* >60.00 mL/min Final     Physical Examination:  BP 116/68  Pulse 82  Temp(Src) 98.5 F (36.9 C)  Resp 12  Ht 6' (1.829 m)  Wt 249 lb (112.946 kg)  BMI 33.76 kg/m2  SpO2 95%  No pedal edema    ASSESSMENT:  Diabetes type 2, uncontrolled   His blood sugar control is improving with adding mealtime coverage He has progressively titrated his NovoLog dose to control his postprandial readings However these are fluctuating based on his meal intake and carbohydrates and currently not counting carbohydrates Fasting readings are excellent. Fructosamine still indicates mild degree of hyperglycemia  NEUROPATHY: Forms filled out for diabetic shoes  HYPOKALEMIA: Although his Lasix has been reduced he still has significant hypokalemia and will increase his sibling by 20 mEq daily  Hypertension: Well controlled  Mild CKD: Creatinine clearance is 52 currently, medication management done by PCP and cardiologist  PLAN:   He will continue NOVOLOG insulin but reduce it to as low as 8 units if eating very little carbohydrate and 12-14 units for complete meals  Discussed adjusting it based on carbohydrate content and will need more specific guidelines from nurse educator  Continue same Levemir insulin Discussed adjusting this every 3 days based on fasting blood sugar, the target for this is 90-130  Consider adding Victoza if blood sugars are not well controlled and he starts gaining excessive weight  Counseling time over 50% of today's 25 minute visit  Daxtyn Rottenberg 02/10/2013, 6:26 PM

## 2013-02-10 MED ORDER — GENTAMICIN SULFATE 40 MG/ML IJ SOLN
80.0000 mg | INTRAMUSCULAR | Status: DC
  Filled 2013-02-10: qty 2

## 2013-02-10 MED ORDER — VANCOMYCIN HCL 10 G IV SOLR
1500.0000 mg | INTRAVENOUS | Status: DC
  Filled 2013-02-10: qty 1500

## 2013-02-11 ENCOUNTER — Ambulatory Visit (HOSPITAL_COMMUNITY)
Admission: RE | Admit: 2013-02-11 | Discharge: 2013-02-11 | Disposition: A | Discharge status: Home / Self Care | Payer: Medicare Other | Source: Ambulatory Visit | Attending: Internal Medicine | Admitting: Internal Medicine

## 2013-02-11 ENCOUNTER — Encounter (HOSPITAL_COMMUNITY)
Admission: RE | Disposition: A | Discharge status: Home / Self Care | Payer: Self-pay | Source: Ambulatory Visit | Attending: Internal Medicine

## 2013-02-11 DIAGNOSIS — M109 Gout, unspecified: Secondary | ICD-10-CM | POA: Insufficient documentation

## 2013-02-11 DIAGNOSIS — I252 Old myocardial infarction: Secondary | ICD-10-CM | POA: Insufficient documentation

## 2013-02-11 DIAGNOSIS — I509 Heart failure, unspecified: Secondary | ICD-10-CM

## 2013-02-11 DIAGNOSIS — I2589 Other forms of chronic ischemic heart disease: Secondary | ICD-10-CM | POA: Insufficient documentation

## 2013-02-11 DIAGNOSIS — Z794 Long term (current) use of insulin: Secondary | ICD-10-CM | POA: Insufficient documentation

## 2013-02-11 DIAGNOSIS — Z87891 Personal history of nicotine dependence: Secondary | ICD-10-CM | POA: Insufficient documentation

## 2013-02-11 DIAGNOSIS — Z95810 Presence of automatic (implantable) cardiac defibrillator: Secondary | ICD-10-CM

## 2013-02-11 DIAGNOSIS — Z01812 Encounter for preprocedural laboratory examination: Secondary | ICD-10-CM | POA: Insufficient documentation

## 2013-02-11 DIAGNOSIS — M129 Arthropathy, unspecified: Secondary | ICD-10-CM | POA: Insufficient documentation

## 2013-02-11 DIAGNOSIS — I5022 Chronic systolic (congestive) heart failure: Secondary | ICD-10-CM | POA: Insufficient documentation

## 2013-02-11 DIAGNOSIS — I10 Essential (primary) hypertension: Secondary | ICD-10-CM | POA: Insufficient documentation

## 2013-02-11 DIAGNOSIS — Z7901 Long term (current) use of anticoagulants: Secondary | ICD-10-CM | POA: Insufficient documentation

## 2013-02-11 DIAGNOSIS — J4489 Other specified chronic obstructive pulmonary disease: Secondary | ICD-10-CM | POA: Insufficient documentation

## 2013-02-11 DIAGNOSIS — Z79899 Other long term (current) drug therapy: Secondary | ICD-10-CM | POA: Insufficient documentation

## 2013-02-11 DIAGNOSIS — Z4502 Encounter for adjustment and management of automatic implantable cardiac defibrillator: Secondary | ICD-10-CM | POA: Insufficient documentation

## 2013-02-11 DIAGNOSIS — I4891 Unspecified atrial fibrillation: Secondary | ICD-10-CM | POA: Insufficient documentation

## 2013-02-11 DIAGNOSIS — I2510 Atherosclerotic heart disease of native coronary artery without angina pectoris: Secondary | ICD-10-CM | POA: Insufficient documentation

## 2013-02-11 DIAGNOSIS — I428 Other cardiomyopathies: Secondary | ICD-10-CM

## 2013-02-11 DIAGNOSIS — I4892 Unspecified atrial flutter: Secondary | ICD-10-CM | POA: Insufficient documentation

## 2013-02-11 DIAGNOSIS — J449 Chronic obstructive pulmonary disease, unspecified: Secondary | ICD-10-CM | POA: Insufficient documentation

## 2013-02-11 DIAGNOSIS — E785 Hyperlipidemia, unspecified: Secondary | ICD-10-CM | POA: Insufficient documentation

## 2013-02-11 DIAGNOSIS — Z7982 Long term (current) use of aspirin: Secondary | ICD-10-CM | POA: Insufficient documentation

## 2013-02-11 DIAGNOSIS — E119 Type 2 diabetes mellitus without complications: Secondary | ICD-10-CM | POA: Insufficient documentation

## 2013-02-11 HISTORY — PX: IMPLANTABLE CARDIOVERTER DEFIBRILLATOR (ICD) GENERATOR CHANGE: SHX5469

## 2013-02-11 LAB — BASIC METABOLIC PANEL
Calcium: 8.9 mg/dL (ref 8.4–10.5)
Chloride: 102 mEq/L (ref 96–112)
GFR calc Af Amer: 58 mL/min — ABNORMAL LOW (ref >90)
GFR calc non Af Amer: 50 mL/min — ABNORMAL LOW (ref >90)
Potassium: 3.3 mEq/L — ABNORMAL LOW (ref 3.5–5.1)
Sodium: 140 mEq/L (ref 135–145)

## 2013-02-11 LAB — PROTIME-INR
INR: 1.33 (ref 0.00–1.49)
Prothrombin Time: 16.2 seconds — ABNORMAL HIGH (ref 11.6–15.2)

## 2013-02-11 LAB — GLUCOSE, CAPILLARY
Glucose-Capillary: 135 mg/dL — ABNORMAL HIGH (ref 70–99)
Glucose-Capillary: 118 mg/dL — ABNORMAL HIGH (ref 70–99)

## 2013-02-11 LAB — SURGICAL PCR SCREEN: Staphylococcus aureus: NEGATIVE

## 2013-02-11 SURGERY — ICD GENERATOR CHANGE
Anesthesia: LOCAL

## 2013-02-11 MED ORDER — CHLORHEXIDINE GLUCONATE 4 % EX LIQD: 60.0000 mL | Freq: Once | CUTANEOUS | Status: DC

## 2013-02-11 MED ORDER — SODIUM CHLORIDE 0.9 % IV SOLN
INTRAVENOUS | Status: DC
  Administered 2013-02-11: 07:00:00 via INTRAVENOUS

## 2013-02-11 MED ORDER — ACETAMINOPHEN 325 MG PO TABS: 325.0000 mg | ORAL_TABLET | ORAL | Status: DC | PRN

## 2013-02-11 MED ORDER — MUPIROCIN 2 % EX OINT
TOPICAL_OINTMENT | CUTANEOUS | Status: AC
  Filled 2013-02-11: qty 22

## 2013-02-11 MED ORDER — FENTANYL CITRATE 0.05 MG/ML IJ SOLN
INTRAMUSCULAR | Status: AC
  Filled 2013-02-11: qty 2

## 2013-02-11 MED ORDER — MIDAZOLAM HCL 5 MG/5ML IJ SOLN
INTRAMUSCULAR | Status: AC
  Filled 2013-02-11: qty 5

## 2013-02-11 MED ORDER — MUPIROCIN 2 % EX OINT
TOPICAL_OINTMENT | Freq: Two times a day (BID) | CUTANEOUS | Status: DC
  Administered 2013-02-11: 07:00:00 via NASAL

## 2013-02-11 MED ORDER — ONDANSETRON HCL 4 MG/2ML IJ SOLN: 4.0000 mg | Freq: Four times a day (QID) | INTRAMUSCULAR | Status: DC | PRN

## 2013-02-11 NOTE — CV Procedure (Signed)
ICD generator change without immediate complication. G#[id number].

## 2013-02-11 NOTE — Op Note (Signed)
NAME:  [name], [name] NO.:  [id number]  MEDICAL RECORD NO.:  [id number]  LOCATION:  [facility]                         FACILITY:  [facility]  PHYSICIAN:  Doylene Canning. Ladona Ridgel, MD    DATE OF BIRTH:  [birth year]  DATE OF PROCEDURE:  02/11/2013 DATE OF DISCHARGE:                              OPERATIVE REPORT   PROCEDURE PERFORMED:  Removal of a previously implanted biventricular ICD, insertion of a new biventricular ICD with ICD pocket revision.  INTRODUCTION:  The patient is a 72 year old man with an ischemic cardiomyopathy and chronic systolic heart failure.  His ejection fraction has been somewhat variable from the 30% range all the way to a high of 50%.  Most recently, his echocardiogram several weeks ago demonstrated his EF of was 40%.  The patient has had multiple ICD therapies.  While he has underlying atrial fibrillation at times, these therapies were clearly for rapid atrial fibrillation and other times, his ICD therapies were thought to be due to ventricular tachycardia. With the cavity out that these were all in the setting of atrial fibrillation.  He has reached elective replacement prematurely as his left ventricular lead has been had an elevated pacing threshold.  He is now here for additional ICD generator change out.  DESCRIPTION OF PROCEDURE:  After informed was obtained, the patient was taken to the diagnostic EP lab in the fasting state.  After usual preparation and draping, intravenous fentanyl and midazolam was given for sedation.  A 30 mL of lidocaine was infiltrated into the right infraclavicular region.  A 7-cm incision was carried out over this region and electrocautery was utilized to dissect down to the ICD pocket.  The generator was freed up from its dense fibrous adhesions and removed with gentle traction.  The pocket was irrigated.  Electrocautery was utilized to assure hemostasis.  The generator was removed from the old leads and the new Medtronic  Viva XT CRT-D biventricular ICD serial number [id number] was connected to the atrial RV and LV leads.  The device was placed back in the subcutaneous pocket.  The pocket prior to reinsertion of the device was revised as the generator had migrated somewhat laterally.  The pocket was placed more superiorly and more medially.  The generator was secured with silk suture in this more medial site and the pocket was irrigated with additional antibiotic irrigation.  The incision was closed with 2-0 and 3-0 Vicryl suture. Benzoin and Steri-Strips were painted on the skin, a pressure dressing were applied.  The patient was returned to his room in satisfactory condition.  COMPLICATIONS:  There were no immediate procedure complications.  RESULTS:  This demonstrate successful removal of previously implanted Medtronic biventricular ICD and insertion of a new biventricular ICD without immediate procedure complication.     Doylene Canning. Ladona Ridgel, MD    GWT/MEDQ  D:  02/11/2013  T:  02/11/2013  Job:  [id number]

## 2013-02-11 NOTE — Telephone Encounter (Signed)
**Note De-Identified [name] Obfuscation** Weight= 242 lbs.  The pts wife reports no change in pts edema or SOB at this time. The pt had BI V ICD generator change out today, pts wife states he is doing well.

## 2013-02-11 NOTE — H&P (View-Only) (Signed)
HPI [name] returns today for ICD followup. He is a pleasant 72 yo man with an ICM, chronic systolic heart failure, s/p ICD implant. He has chronic atrial fibrillation.   The patient has a chronically elevated left ventricular pacing threshold. He has a history of gout, and is bothered with his diet is high in protein. He has mild peripheral edema. No ICD shock. No syncope. He has a spinal stimulator which has improved his back pain markedly. Back in 2013, repeat echo demonstrated that his LV function had improved. He does pace for bradycardia. Allergies  Allergen Reactions  . Avelox [Moxifloxacin Hcl In Nacl] Swelling, Rash and Other (See Comments)    Patient became hypotensive after infusion started Because of a history of documented adverse serious drug reaction;Medi Alert bracelet  is recommended  . Penicillins     REACTION: anaphylaxis Because of a history of documented adverse serious drug reaction;Medi Alert bracelet  is recommended     Current Outpatient Prescriptions  Medication Sig Dispense Refill  . allopurinol (ZYLOPRIM) 100 MG tablet Take 1 tablet (100 mg total) by mouth daily.  30 tablet  5  . amLODipine (NORVASC) 5 MG tablet Take 1 tablet (5 mg total) by mouth 2 (two) times daily.  180 tablet  3  . aspirin 81 MG tablet Take 81 mg by mouth once a week. sunday      . atorvastatin (LIPITOR) 10 MG tablet TAKE ONE TABLET BY MOUTH ONCE DAILY  90 tablet  0  . carvedilol (COREG) 25 MG tablet Take 0.5 tablets (12.5 mg total) by mouth 2 (two) times daily with a meal.  90 tablet  1  . chlorthalidone (HYGROTON) 25 MG tablet Take 0.5 tablets (12.5 mg total) by mouth as needed. Take 12.5 mg when weight reaches 242 lbs or greater.  30 tablet  3  . cholecalciferol (VITAMIN D) 1000 UNITS tablet Take 1,000 Units by mouth daily.      Marland Kitchen COLCRYS 0.6 MG tablet TAKE AS DIRECTED  100 tablet  0  . furosemide (LASIX) 40 MG tablet Take 2 tablets (80 mg total) by mouth 2 (two) times daily.  360 tablet   3  . gabapentin (NEURONTIN) 100 MG capsule Take 1 capsule (100 mg total) by mouth 2 (two) times daily.  180 capsule  1  . glucose blood test strip Freestyle Lite test strips, Test blood sugar three times daily  300 each  9  . Insulin Aspart (NOVOLOG FLEXPEN [place]) Inject 10 Units into the skin as directed.      . Insulin Detemir (LEVEMIR FLEXPEN) 100 UNIT/ML SOPN Inject 12 Units into the skin daily.  3 mL  5  . ipratropium (ATROVENT) 0.06 % nasal spray Place 1 spray into the nose daily.       . Lancets (FREESTYLE) lancets Test three times a day Dx 250.00 freestyle lite lancet  300 each  6  . losartan (COZAAR) 50 MG tablet Take 1 tablet (50 mg total) by mouth daily.  90 tablet  1  . nitroGLYCERIN (NITROSTAT) 0.4 MG SL tablet Place 1 tablet (0.4 mg total) under the tongue every 5 (five) minutes as needed.  25 tablet  3  . potassium chloride SA (K-DUR,KLOR-CON) 20 MEQ tablet Take 1 tablet (20 mEq total) by mouth 2 (two) times daily.  60 tablet  5  . warfarin (COUMADIN) 2.5 MG tablet Take as directed by Anticoagulation clinic  120 tablet  1  . Insulin Pen Needle (NOVOFINE) 32G X 6  MM MISC Use once daily with Levemir injection DX: 250.70  100 each  3   No current facility-administered medications for this visit.     Past Medical History  Diagnosis Date  . Diabetes mellitus   . Hyperlipidemia   . Hypertension   . Pilonidal cyst   . Atrial fibrillation     Previous long-term amiodarone therapy with multiple cardioversions / amiodarone stopped September, 2009  . Atrial flutter     Started November, 2010, Left-sided and cannot ablate  . Left atrial thrombus     Remote past... cardioversions done since that time  . Wide-complex tachycardia   . Left ventricular ejection fraction less than 40%   . Gout   . AAA (abdominal aortic aneurysm)     Surgical repair  . Discolored skin   . S/P ICD (internal cardiac defibrillator) procedure     Dr. Ladona Ridgel 2009... by the pacing  . SOB (shortness of breath)      Large left effusion/ thoracentesis/hospitalization/November, 2011... exudated.. cytology negative.. Dr.Wert.. no proof of mesothelioma  . Pericardial effusion   . Pleural effusion     Large loculated effusion on the left side November, 2011. This was tapped. It was exudative. Cytology revealed no cancer no proof of mesothelioma area pulmonary team felt that no further workup was needed  . S/P AAA repair   . Spinal stenosis     Surgery Dr.Elsner  . CAD (coronary artery disease)     Catheterization July, 2008... name and vein grafts patent but low cardiac output  . Warfarin anticoagulation   . Cardiomyopathy     Ischemic... ICD  . Combined systolic and diastolic CHF   . Venous insufficiency     Toe discoloration chronic  . Mitral regurgitation     Mild echo  . Aortic valve sclerosis   . Nasal drainage     Chronic  . Alcohol ingestion of more than four drinks per week     Excess beer  not a dependency problem  . Chronotropic incompetence     IV pacing rate adjusted  . Thrombophlebitis of superficial veins of upper extremities     Possible venous stenosis from defibrillator  . Pericardial effusion     November, 2011 .. decreased during hospitalization  . Eye abnormality     Ophthalmologist questions a clot in one of his eyes, May, 2012  . Overweight(278.02)     November, 2012  . Pleural thickening   . Ejection fraction     Ejection fraction has varied over time from 35-50%.,, Echoes are technically very difficult,,, EF 50%, echo, May 25, 2011, technically very difficult  . Drug therapy     Redness and swelling with Avelox infusion May 24, 2011  . COPD (chronic obstructive pulmonary disease)   . Myocardial infarction   . Spinal cord stimulator status     October, 2013  . Carotid artery disease     Doppler, December, 2013, 0-39% bilateral  . Pacemaker   . Pneumonia   . Arthritis   . ICD (implantable cardiac defibrillator) in place   . Ventral hernia     April, 2014,  result of his abdominal surgery  . Bony abnormality     Patient's manubrium is slightly displaced to the right    ROS:   All systems reviewed and negative except as noted in the HPI.   Past Surgical History  Procedure Laterality Date  . Colonoscopy w/ polypectomy    . Icd  insertion  . Lumbar fusion    . Pilonidal cyst removal    . Surgery scrotal / testicular    . Coronary artery bypass graft  2004  . Abdominal aortic aneurysm repair  11/2002  . Insert / replace / remove pacemaker    . Back surgery    . Incision and drainage abscess / hematoma of bursa / knee / thigh       Family History  Problem Relation Age of Onset  . Hypertension Mother   . Stroke Mother   . Diabetes Father   . Coronary artery disease Father   . Other Father     DVT     History   Social History  . Marital Status: Married    Spouse Name: N/A    Number of Children: N/A  . Years of Education: N/A   Occupational History  . Not on file.   Social History Main Topics  . Smoking status: Former Smoker    Quit date: 03/22/1995  . Smokeless tobacco: Former Neurosurgeon  . Alcohol Use: 1.2 oz/week    2 Cans of beer per week     Comment: beer  . Drug Use: No  . Sexual Activity: No   Other Topics Concern  . Not on file   Social History Narrative  . No narrative on file     BP 112/75  Pulse 73  Ht 6' (1.829 m)  Wt 250 lb 1.9 oz (113.454 kg)  BMI 33.92 kg/m2  Physical Exam:  Well appearing 72 year old man, NAD HEENT: Unremarkable Neck:  7cm JVD, no thyromegally Lungs:  Clear except for minimal basilar rales. No wheezes, no rhonchi. HEART:  Regular rate rhythm, no murmurs, no rubs, no clicks Abd:  soft, positive bowel sounds, no organomegally, no rebound, no guarding Ext:  2 plus pulses, no edema, no cyanosis, no clubbing Skin:  No rashes no nodules Neuro:  CN II through XII intact, motor grossly intact  DEVICE  Normal device function.  See PaceArt for details. Device is at  [facility]  Assess/Plan:

## 2013-02-11 NOTE — Interval H&P Note (Signed)
History and Physical Interval Note: Since his last clinic visit, his echo demonstrates an EF of 40%. He has previously had echo's in the 30% range. Also at times his EF's have been in the 50% range. I have reviewed recent results from studies at the [facility] and the evidence is that he should have an ICD placed. I have discussed this with the patient and his wife and they wish to proceed. 02/11/2013 7:12 AM  [name]  has presented today for surgery, with the diagnosis of chf  The various methods of treatment have been discussed with the patient and family. After consideration of risks, benefits and other options for treatment, the patient has consented to  Procedure(s): ICD GENERATOR CHANGE (N/A) as a surgical intervention .  The patient's history has been reviewed, patient examined, no change in status, stable for surgery.  I have reviewed the patient's chart and labs.  Questions were answered to the patient's satisfaction.     [name]

## 2013-02-12 NOTE — Telephone Encounter (Signed)
**Note De-Identified Countess Biebel Obfuscation** Weight= 242 lbs.  The pts wife reports no changes in pts right leg edema (none anywhere else) or SOB at this time.

## 2013-02-13 ENCOUNTER — Encounter (HOSPITAL_COMMUNITY): Payer: Self-pay | Admitting: *Deleted

## 2013-02-13 ENCOUNTER — Other Ambulatory Visit: Payer: Self-pay | Admitting: *Deleted

## 2013-02-13 MED ORDER — INSULIN ASPART 100 UNIT/ML FLEXPEN: PEN_INJECTOR | SUBCUTANEOUS | Status: DC

## 2013-02-13 NOTE — Telephone Encounter (Signed)
Weight= 240 lbs. The pt reports no changes in his right leg edema and no SOB at this time.

## 2013-02-18 ENCOUNTER — Telehealth: Payer: Self-pay

## 2013-02-18 ENCOUNTER — Telehealth: Payer: Self-pay | Admitting: Internal Medicine

## 2013-02-18 NOTE — Telephone Encounter (Signed)
CHF call:  [id number] 

## 2013-02-18 NOTE — Telephone Encounter (Signed)
New message    Got new device box in mail---want to make sure you got defib transmission

## 2013-02-18 NOTE — Telephone Encounter (Signed)
Transmission received, patient aware. 

## 2013-02-20 NOTE — Telephone Encounter (Signed)
LMTCB X 2 today.

## 2013-02-21 NOTE — Telephone Encounter (Signed)
New message    Talk to [name] only---she would not tell me what she wanted.  Pls call on her cell phone not her home phone.

## 2013-02-21 NOTE — Telephone Encounter (Signed)
**Note De-identified Kaymen Adrian Obfuscation** LMTCB X2

## 2013-02-22 NOTE — Telephone Encounter (Signed)
Follow up    Wife request to talk to Larita Fife only----she is out of town and want you to call her on her cell phone.   She would not tell me what she wanted.

## 2013-02-22 NOTE — Telephone Encounter (Signed)
Lmtcb.  Lm that [name] would be out of office 12/8-12/19.

## 2013-02-22 NOTE — Telephone Encounter (Signed)
Error- Larita Fife will be in office 12/8; notified pt

## 2013-02-22 NOTE — Telephone Encounter (Signed)
Called pt and advised that Larita Fife is not in office today but would be back on Mon 12/8.  Will forward message to her.  She wants to be called on her cell phone

## 2013-02-25 ENCOUNTER — Telehealth: Payer: Self-pay | Admitting: *Deleted

## 2013-02-25 NOTE — Telephone Encounter (Signed)
I called to speak with the patient about trying to enroll him in the [facility] clinic for monthly checks on his Optivol levels. The patient was unavailable, but per his wife, they were going to call today as the patient has not been feeling well. Per Mrs. Valente, he has been having weakness since his generator change. I offered an appointment with the device clinic today, but they prefer to see Dr. Ladona Ridgel. OK per Tresa Endo to schedule for 12/9 at 12:00 pm. The patient's wife is agreeable. They will think about ICM clinic.

## 2013-02-25 NOTE — Telephone Encounter (Signed)
Weight= 240 lbs.  The pts wife, [name], states that the pt is a little SOB and no changes in his right leg edema.

## 2013-02-26 ENCOUNTER — Encounter: Payer: Self-pay | Admitting: Internal Medicine

## 2013-02-26 ENCOUNTER — Ambulatory Visit (INDEPENDENT_AMBULATORY_CARE_PROVIDER_SITE_OTHER): Payer: Medicare Other | Admitting: Internal Medicine

## 2013-02-26 VITALS — BP 112/78 | HR 70 | Ht 72.0 in | Wt 248.0 lb

## 2013-02-26 DIAGNOSIS — I509 Heart failure, unspecified: Secondary | ICD-10-CM

## 2013-02-26 DIAGNOSIS — I4891 Unspecified atrial fibrillation: Secondary | ICD-10-CM

## 2013-02-26 DIAGNOSIS — I5022 Chronic systolic (congestive) heart failure: Secondary | ICD-10-CM

## 2013-02-26 DIAGNOSIS — Z95810 Presence of automatic (implantable) cardiac defibrillator: Secondary | ICD-10-CM

## 2013-02-26 DIAGNOSIS — I429 Cardiomyopathy, unspecified: Secondary | ICD-10-CM

## 2013-02-26 DIAGNOSIS — I428 Other cardiomyopathies: Secondary | ICD-10-CM

## 2013-02-26 LAB — MDC_IDC_ENUM_SESS_TYPE_INCLINIC
Battery Voltage: 3.12 V
Brady Statistic RA Percent Paced: 0 %
Brady Statistic RV Percent Paced: 70.89 %
Date Time Interrogation Session: 20141209135458
HighPow Impedance: 190 Ohm
Lead Channel Impedance Value: 4047 Ohm
Lead Channel Impedance Value: 437 Ohm
Lead Channel Impedance Value: 456 Ohm
Lead Channel Pacing Threshold Amplitude: 0.625 V
Lead Channel Pacing Threshold Pulse Width: 0.4 ms
Lead Channel Sensing Intrinsic Amplitude: 0.5 mV
Lead Channel Sensing Intrinsic Amplitude: 0.875 mV
Lead Channel Sensing Intrinsic Amplitude: 10.375 mV
Lead Channel Sensing Intrinsic Amplitude: 9.75 mV
Lead Channel Setting Pacing Pulse Width: 0.4 ms
Lead Channel Setting Sensing Sensitivity: 0.3 mV
Zone Setting Detection Interval: 300 ms
Zone Setting Detection Interval: 340 ms

## 2013-02-26 NOTE — Progress Notes (Signed)
HPI [name] returns today for followup. He is a 72 year old man with an ischemic cardiomyopathy, chronic atrial fibrillation, status post biventricular ICD implantation. The patient's  Heart failure symptoms improved and his ejection fraction improved. He reached elective replacement on his device. Prior to this, we have turned off his left ventricular pacing because of elevated left ventricular pacing thresholds. He appeared to have no change in his clinical status and a repeat echo prior to generator change demonstrated an ejection fraction of 40%. Review of his his ICD interrogations however demonstrated that in the past he had anti-tachycardic pacing therapy for ventricular tachycardia. For this reason he was not downgraded but underwent ICD generator change. His left ventricular lead was not programmed on. He returns today several weeks after generator change out complain of shortness of breath. He notes that his energy level is reduced. He has received no shock and had no syncope. No peripheral edema. No fevers and chills. His incision has healed nicely. Allergies  Allergen Reactions  . Avelox [Moxifloxacin Hcl In Nacl] Swelling, Rash and Other (See Comments)    Patient became hypotensive after infusion started Because of a history of documented adverse serious drug reaction;Medi Alert bracelet  is recommended  . Penicillins     REACTION: anaphylaxis Because of a history of documented adverse serious drug reaction;Medi Alert bracelet  is recommended     Current Outpatient Prescriptions  Medication Sig Dispense Refill  . allopurinol (ZYLOPRIM) 100 MG tablet Take 1 tablet (100 mg total) by mouth daily.  30 tablet  5  . amLODipine (NORVASC) 5 MG tablet Take 1 tablet (5 mg total) by mouth 2 (two) times daily.  180 tablet  3  . aspirin 81 MG tablet Take 81 mg by mouth once a week. sunday      . atorvastatin (LIPITOR) 10 MG tablet Take 10 mg by mouth daily.      . carvedilol (COREG)  25 MG tablet Take 0.5 tablets (12.5 mg total) by mouth 2 (two) times daily with a meal.  90 tablet  1  . chlorthalidone (HYGROTON) 25 MG tablet Take 0.5 tablets (12.5 mg total) by mouth as needed. Take 12.5 mg when weight reaches 242 lbs or greater.  30 tablet  3  . cholecalciferol (VITAMIN D) 1000 UNITS tablet Take 1,000 Units by mouth daily.      . colchicine 0.6 MG tablet Take 0.6 mg by mouth daily as needed (Gout).      . furosemide (LASIX) 40 MG tablet Take 2 tablets (80 mg total) by mouth 2 (two) times daily.  360 tablet  3  . gabapentin (NEURONTIN) 100 MG capsule Take 1 capsule (100 mg total) by mouth 2 (two) times daily.  180 capsule  1  . Glucose Blood (FREESTYLE LITE TEST VI)       . insulin aspart (NOVOLOG FLEXPEN) 100 UNIT/ML SOPN FlexPen Take 8 units for small meals and 12-14 units for complete meals  5 pen  5  . Insulin Detemir (LEVEMIR FLEXTOUCH) 100 UNIT/ML SOPN Inject 37 Units into the skin at bedtime.      . Insulin Pen Needle (NOVOFINE) 32G X 6 MM MISC Use once daily with Levemir injection DX: 250.70  100 each  3  . ipratropium (ATROVENT) 0.06 % nasal spray Place 1 spray into the nose daily.       . Lancets (FREESTYLE) lancets       . losartan (COZAAR) 50 MG tablet Take 1 tablet (  50 mg total) by mouth daily.  90 tablet  1  . naphazoline-pheniramine (NAPHCON-A) 0.025-0.3 % ophthalmic solution Place 1 drop into both eyes daily as needed for irritation.      . nitroGLYCERIN (NITROSTAT) 0.4 MG SL tablet Place 1 tablet (0.4 mg total) under the tongue every 5 (five) minutes as needed.  25 tablet  3  . potassium chloride SA (K-DUR,KLOR-CON) 20 MEQ tablet Take 1 tablet (20 mEq total) by mouth 2 (two) times daily.  60 tablet  5  . warfarin (COUMADIN) 2.5 MG tablet Take 2.5-5 mg by mouth daily. Take 2.5mg  every day of the week except on Wednesday take 5mg .      [name] [address]81 UNT/0.65ML injection        No current facility-administered medications for this visit.     Past Medical  History  Diagnosis Date  . Diabetes mellitus   . Hyperlipidemia   . Hypertension   . Pilonidal cyst   . Atrial fibrillation     Previous long-term amiodarone therapy with multiple cardioversions / amiodarone stopped September, 2009  . Atrial flutter     Started November, 2010, Left-sided and cannot ablate  . Left atrial thrombus     Remote past... cardioversions done since that time  . Wide-complex tachycardia   . Left ventricular ejection fraction less than 40%   . Gout   . AAA (abdominal aortic aneurysm)     Surgical repair  . Discolored skin   . S/P ICD (internal cardiac defibrillator) procedure     Dr. Ladona Ridgel 2009... by the pacing  . SOB (shortness of breath)     Large left effusion/ thoracentesis/hospitalization/November, 2011... exudated.. cytology negative.. Dr.Wert.. no proof of mesothelioma  . Pericardial effusion   . Pleural effusion     Large loculated effusion on the left side November, 2011. This was tapped. It was exudative. Cytology revealed no cancer no proof of mesothelioma area pulmonary team felt that no further workup was needed  . S/P AAA repair   . Spinal stenosis     Surgery Dr.Elsner  . CAD (coronary artery disease)     Catheterization July, 2008... name and vein grafts patent but low cardiac output  . Warfarin anticoagulation   . Cardiomyopathy     Ischemic... ICD  . Combined systolic and diastolic CHF   . Venous insufficiency     Toe discoloration chronic  . Mitral regurgitation     Mild echo  . Aortic valve sclerosis   . Nasal drainage     Chronic  . Alcohol ingestion of more than four drinks per week     Excess beer  not a dependency problem  . Chronotropic incompetence     IV pacing rate adjusted  . Thrombophlebitis of superficial veins of upper extremities     Possible venous stenosis from defibrillator  . Pericardial effusion     November, 2011 .. decreased during hospitalization  . Eye abnormality     Ophthalmologist questions a clot  in one of his eyes, May, 2012  . Overweight(278.02)     November, 2012  . Pleural thickening   . Ejection fraction     Ejection fraction has varied over time from 35-50%.,, Echoes are technically very difficult,,, EF 50%, echo, May 25, 2011, technically very difficult  . Drug therapy     Redness and swelling with Avelox infusion May 24, 2011  . COPD (chronic obstructive pulmonary disease)   . Myocardial infarction   . Spinal cord  stimulator status     October, 2013  . Carotid artery disease     Doppler, December, 2013, 0-39% bilateral  . Pneumonia   . Arthritis   . Ventral hernia     April, 2014, result of his abdominal surgery  . Bony abnormality     Patient's manubrium is slightly displaced to the right    ROS:   All systems reviewed and negative except as noted in the HPI.   Past Surgical History  Procedure Laterality Date  . Colonoscopy w/ polypectomy    . Lumbar fusion    . Pilonidal cyst removal    . Surgery scrotal / testicular    . Coronary artery bypass graft  2004  . Abdominal aortic aneurysm repair  11/2002  . Bi-ventricular pacemaker insertion (crt-p)  02-11-2013    Pt with previously implanted MDT CRTD downgraded to CRTP by Dr Ladona Ridgel 02-11-13  . Back surgery    . Incision and drainage abscess / hematoma of bursa / knee / thigh       Family History  Problem Relation Age of Onset  . Hypertension Mother   . Stroke Mother   . Diabetes Father   . Coronary artery disease Father   . Other Father     DVT     History   Social History  . Marital Status: Married    Spouse Name: N/A    Number of Children: N/A  . Years of Education: N/A   Occupational History  . Not on file.   Social History Main Topics  . Smoking status: Former Smoker    Quit date: 03/22/1995  . Smokeless tobacco: Former Neurosurgeon  . Alcohol Use: 1.2 oz/week    2 Cans of beer per week     Comment: beer  . Drug Use: No  . Sexual Activity: No   Other Topics Concern  . Not on file     Social History Narrative  . No narrative on file     BP 112/78  Pulse 70  Ht 6' (1.829 m)  Wt 248 lb (112.492 kg)  BMI 33.63 kg/m2  Physical Exam:  Well appearing 72 year old man,NAD HEENT: Unremarkable Neck:  No JVD, no thyromegally Back:  No CVA tenderness Lungs:  Clearwith no wheezes, rales, or rhonchi. Well-healed ICD incision. HEART:  Regular rate rhythm, no murmurs, no rubs, no clicks Abd:  soft, positive bowel sounds, no organomegally, no rebound, no guarding Ext:  2 plus pulses, no edema, no cyanosis, no clubbing Skin:  No rashes no nodules Neuro:  CN II through XII intact, motor grossly intact  DEVICE  Normal device function.  See PaceArt for details.  He is pacing in the ventricle 70% of the time  Assess/Plan:

## 2013-02-26 NOTE — Assessment & Plan Note (Signed)
His device appears to be working normally. We have reprogrammed to minimize ventricular pacing. If his symptoms are not improved, we could also turned back on biventricular pacing. I'll be reluctant to do this unless capsule it necessary out of concerns of draining his battery as his left ventricular pacing threshold is high.

## 2013-02-26 NOTE — Patient Instructions (Signed)
Your physician wants you to follow-up in: 12 months with Dr Court Joy will receive a reminder letter in the mail two months in advance. If you don't receive a letter, please call our office to schedule the follow-up appointment.   Remote monitoring is used to monitor your Pacemaker or ICD from home. This monitoring reduces the number of office visits required to check your device to one time per year. It allows [place] to keep an eye on the functioning of your device to ensure it is working properly. You are scheduled for a device check from home on 0312/15 You may send your transmission at any time that day. If you have a wireless device, the transmission will be sent automatically. After your physician reviews your transmission, you will receive a postcard with your next transmission date.  Will send in transmission to Sherri Rad, RN on 04/01/12

## 2013-02-26 NOTE — Assessment & Plan Note (Signed)
His ventricular rate is well controlled. He was pacing in the ventricle 70% of the time. No change in medical therapy.

## 2013-02-26 NOTE — Assessment & Plan Note (Signed)
I suspect his heart failure symptoms have worsened because of right ventricular pacing. We have turned down his pacer rate today and he had minimal if any ventricular pacing after reprogramming his device VVI at 50.

## 2013-02-27 NOTE — Telephone Encounter (Signed)
**Note De-Identified Arden Tinoco Obfuscation** Weight= 238 lbs. The pts wife, [name], states that the pt continues to have swelling in his right leg and that his SOB is improved today.

## 2013-02-28 NOTE — Telephone Encounter (Signed)
Weight= 240 lbs.  The pt's wife, [name] states that the pt continues to have right leg edema and is a little SOB at this time. The pt has f/u with Dr Myrtis Ser on 12/15.

## 2013-03-04 ENCOUNTER — Ambulatory Visit (INDEPENDENT_AMBULATORY_CARE_PROVIDER_SITE_OTHER): Payer: Medicare Other | Admitting: *Deleted

## 2013-03-04 ENCOUNTER — Ambulatory Visit: Payer: Medicare Other | Admitting: Internal Medicine

## 2013-03-04 ENCOUNTER — Ambulatory Visit: Payer: Medicare Other

## 2013-03-04 ENCOUNTER — Ambulatory Visit (INDEPENDENT_AMBULATORY_CARE_PROVIDER_SITE_OTHER): Payer: Medicare Other | Admitting: Cardiology

## 2013-03-04 ENCOUNTER — Encounter: Payer: Self-pay | Admitting: Cardiology

## 2013-03-04 ENCOUNTER — Other Ambulatory Visit: Payer: Self-pay | Admitting: *Deleted

## 2013-03-04 VITALS — BP 118/60 | HR 68 | Ht 72.0 in | Wt 248.0 lb

## 2013-03-04 DIAGNOSIS — Z7901 Long term (current) use of anticoagulants: Secondary | ICD-10-CM

## 2013-03-04 DIAGNOSIS — I5042 Chronic combined systolic (congestive) and diastolic (congestive) heart failure: Secondary | ICD-10-CM

## 2013-03-04 DIAGNOSIS — Z95810 Presence of automatic (implantable) cardiac defibrillator: Secondary | ICD-10-CM

## 2013-03-04 DIAGNOSIS — R0989 Other specified symptoms and signs involving the circulatory and respiratory systems: Secondary | ICD-10-CM

## 2013-03-04 DIAGNOSIS — I4891 Unspecified atrial fibrillation: Secondary | ICD-10-CM

## 2013-03-04 DIAGNOSIS — E876 Hypokalemia: Secondary | ICD-10-CM

## 2013-03-04 DIAGNOSIS — R9430 Abnormal result of cardiovascular function study, unspecified: Secondary | ICD-10-CM

## 2013-03-04 DIAGNOSIS — I509 Heart failure, unspecified: Secondary | ICD-10-CM

## 2013-03-04 DIAGNOSIS — IMO0002 Reserved for concepts with insufficient information to code with codable children: Secondary | ICD-10-CM

## 2013-03-04 LAB — BASIC METABOLIC PANEL
CO2: 29 mEq/L (ref 19–32)
Calcium: 9 mg/dL (ref 8.4–10.5)
Chloride: 100 mEq/L (ref 96–112)
Glucose, Bld: 185 mg/dL — ABNORMAL HIGH (ref 70–99)
Sodium: 138 mEq/L (ref 135–145)

## 2013-03-04 LAB — POCT INR: INR: 1.6

## 2013-03-04 MED ORDER — GABAPENTIN 100 MG PO CAPS: 100.0000 mg | ORAL_CAPSULE | Freq: Two times a day (BID) | ORAL | Status: DC

## 2013-03-04 MED ORDER — POTASSIUM CHLORIDE CRYS ER 20 MEQ PO TBCR: 20.0000 meq | EXTENDED_RELEASE_TABLET | Freq: Three times a day (TID) | ORAL | Status: DC

## 2013-03-04 NOTE — Patient Instructions (Addendum)
Your physician recommends that you schedule a follow-up appointment in:  3 months. -- March 18,2015 at 1:45

## 2013-03-04 NOTE — Assessment & Plan Note (Signed)
Recently his potassium was low and the dose of his potassium was increased. Potassium will be checked today.

## 2013-03-04 NOTE — Assessment & Plan Note (Signed)
This patient rate was turned down. He's feeling much better. His volume status is stable. No further workup needed.

## 2013-03-04 NOTE — Progress Notes (Signed)
HPI  Patient is seen today for followup of ischemic cardiomyopathy, atrial fibrillation, pacemaker. He recently had a new generator placed. To save battery his left ventricular lead is not turned on. He was chasing a high percentage of the time and was not feeling well when pacing his right ventricle. Dr. Ladona Ridgel started by turning his rate down. This was very effective and he is feeling much better. His weight is stable. He has underlying atrial fib them a guardian that time. He is coumadinized for this. Patient did have an echo in November, 2014. The EF is 40-45%.  Allergies  Allergen Reactions  . Avelox [Moxifloxacin Hcl In Nacl] Swelling, Rash and Other (See Comments)    Patient became hypotensive after infusion started Because of a history of documented adverse serious drug reaction;Medi Alert bracelet  is recommended  . Penicillins     REACTION: anaphylaxis Because of a history of documented adverse serious drug reaction;Medi Alert bracelet  is recommended    Current Outpatient Prescriptions  Medication Sig Dispense Refill  . allopurinol (ZYLOPRIM) 100 MG tablet Take 1 tablet (100 mg total) by mouth daily.  30 tablet  5  . amLODipine (NORVASC) 5 MG tablet Take 1 tablet (5 mg total) by mouth 2 (two) times daily.  180 tablet  3  . aspirin 81 MG tablet Take 81 mg by mouth once a week. sunday      . atorvastatin (LIPITOR) 10 MG tablet Take 10 mg by mouth daily.      . carvedilol (COREG) 25 MG tablet Take 0.5 tablets (12.5 mg total) by mouth 2 (two) times daily with a meal.  90 tablet  1  . chlorthalidone (HYGROTON) 25 MG tablet Take 0.5 tablets (12.5 mg total) by mouth as needed. Take 12.5 mg when weight reaches 242 lbs or greater.  30 tablet  3  . cholecalciferol (VITAMIN D) 1000 UNITS tablet Take 1,000 Units by mouth daily.      . colchicine 0.6 MG tablet Take 0.6 mg by mouth daily as needed (Gout).      . furosemide (LASIX) 40 MG tablet Take 2 tablets (80 mg total) by mouth 2 (two)  times daily.  360 tablet  3  . gabapentin (NEURONTIN) 100 MG capsule Take 1 capsule (100 mg total) by mouth 2 (two) times daily.  180 capsule  1  . Glucose Blood (FREESTYLE LITE TEST VI)       . insulin aspart (NOVOLOG FLEXPEN) 100 UNIT/ML SOPN FlexPen Take 8 units for small meals and 12-14 units for complete meals  5 pen  5  . Insulin Detemir (LEVEMIR FLEXTOUCH) 100 UNIT/ML SOPN Inject 37 Units into the skin at bedtime.      . Insulin Pen Needle (NOVOFINE) 32G X 6 MM MISC Use once daily with Levemir injection DX: 250.70  100 each  3  . ipratropium (ATROVENT) 0.06 % nasal spray Place 1 spray into the nose daily.       . Lancets (FREESTYLE) lancets       . losartan (COZAAR) 50 MG tablet Take 1 tablet (50 mg total) by mouth daily.  90 tablet  1  . naphazoline-pheniramine (NAPHCON-A) 0.025-0.3 % ophthalmic solution Place 1 drop into both eyes daily as needed for irritation.      . nitroGLYCERIN (NITROSTAT) 0.4 MG SL tablet Place 1 tablet (0.4 mg total) under the tongue every 5 (five) minutes as needed.  25 tablet  3  . potassium chloride SA (K-DUR,KLOR-CON) 20 MEQ  tablet Take 20 mEq by mouth 3 (three) times daily.      Marland Kitchen warfarin (COUMADIN) 2.5 MG tablet Take 2.5-5 mg by mouth daily. Take 2.5mg  every day of the week except on Wednesday take 5mg .      . ZOSTAVAX 40981 UNT/0.65ML injection        No current facility-administered medications for this visit.    History   Social History  . Marital Status: Married    Spouse Name: N/A    Number of Children: N/A  . Years of Education: N/A   Occupational History  . Not on file.   Social History Main Topics  . Smoking status: Former Smoker    Quit date: 03/22/1995  . Smokeless tobacco: Former Neurosurgeon  . Alcohol Use: 1.2 oz/week    2 Cans of beer per week     Comment: beer  . Drug Use: No  . Sexual Activity: No   Other Topics Concern  . Not on file   Social History Narrative  . No narrative on file    Family History  Problem Relation  Age of Onset  . Hypertension Mother   . Stroke Mother   . Diabetes Father   . Coronary artery disease Father   . Other Father     DVT    Past Medical History  Diagnosis Date  . Diabetes mellitus   . Hyperlipidemia   . Hypertension   . Pilonidal cyst   . Atrial fibrillation     Previous long-term amiodarone therapy with multiple cardioversions / amiodarone stopped September, 2009  . Atrial flutter     Started November, 2010, Left-sided and cannot ablate  . Left atrial thrombus     Remote past... cardioversions done since that time  . Wide-complex tachycardia   . Left ventricular ejection fraction less than 40%   . Gout   . AAA (abdominal aortic aneurysm)     Surgical repair  . Discolored skin   . S/P ICD (internal cardiac defibrillator) procedure     Dr. Ladona Ridgel 2009... by the pacing  . SOB (shortness of breath)     Large left effusion/ thoracentesis/hospitalization/November, 2011... exudated.. cytology negative.. Dr.Wert.. no proof of mesothelioma  . Pericardial effusion   . Pleural effusion     Large loculated effusion on the left side November, 2011. This was tapped. It was exudative. Cytology revealed no cancer no proof of mesothelioma area pulmonary team felt that no further workup was needed  . S/P AAA repair   . Spinal stenosis     Surgery Dr.Elsner  . CAD (coronary artery disease)     Catheterization July, 2008... name and vein grafts patent but low cardiac output  . Warfarin anticoagulation   . Cardiomyopathy     Ischemic... ICD  . Combined systolic and diastolic CHF   . Venous insufficiency     Toe discoloration chronic  . Mitral regurgitation     Mild echo  . Aortic valve sclerosis   . Nasal drainage     Chronic  . Alcohol ingestion of more than four drinks per week     Excess beer  not a dependency problem  . Chronotropic incompetence     IV pacing rate adjusted  . Thrombophlebitis of superficial veins of upper extremities     Possible venous stenosis  from defibrillator  . Pericardial effusion     November, 2011 .. decreased during hospitalization  . Eye abnormality     Ophthalmologist questions a clot in  one of his eyes, May, 2012  . Overweight(278.02)     November, 2012  . Pleural thickening   . Ejection fraction     Ejection fraction has varied over time from 35-50%.,, Echoes are technically very difficult,,, EF 50%, echo, May 25, 2011, technically very difficult  . Drug therapy     Redness and swelling with Avelox infusion May 24, 2011  . COPD (chronic obstructive pulmonary disease)   . Myocardial infarction   . Spinal cord stimulator status     October, 2013  . Carotid artery disease     Doppler, December, 2013, 0-39% bilateral  . Pneumonia   . Arthritis   . Ventral hernia     April, 2014, result of his abdominal surgery  . Bony abnormality     Patient's manubrium is slightly displaced to the right    Past Surgical History  Procedure Laterality Date  . Colonoscopy w/ polypectomy    . Lumbar fusion    . Pilonidal cyst removal    . Surgery scrotal / testicular    . Coronary artery bypass graft  2004  . Abdominal aortic aneurysm repair  11/2002  . Bi-ventricular pacemaker insertion (crt-p)  02-11-2013    Pt with previously implanted MDT CRTD downgraded to CRTP by Dr Ladona Ridgel 02-11-13  . Back surgery    . Incision and drainage abscess / hematoma of bursa / knee / thigh      Patient Active Problem List   Diagnosis Date Noted  . Combined systolic and diastolic CHF   . Ejection fraction   . Hypopotassemia 12/20/2012  . ACE-inhibitor cough 10/04/2012  . Ventral hernia   . Bony abnormality   . Cellulitis of scrotum 05/23/2012  . Carotid artery disease   . Spinal cord stimulator status   . 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 Atrial fibrillation   . Spinal stenosis   . CAD (coronary artery disease)   . Cardiomyopathy   . Venous insufficiency   . Mitral regurgitation   . Aortic valve  sclerosis   . Alcohol ingestion of more than four drinks per week   . Chronotropic incompetence   . Thrombophlebitis of superficial veins of upper extremities   . Pericardial effusion   . Overweight   . Pleural thickening   . Pleural effusion   . Drug therapy   . Hyperlipidemia   . Hypertension   . Atrial flutter   . Left atrial thrombus   . AAA (abdominal aortic aneurysm)   . S/P ICD (internal cardiac defibrillator) procedure   . Warfarin anticoagulation   . Polyneuropathy in diabetes(357.2) 01/07/2010  . DIVERTICULOSIS, COLON 06/10/2009  . Uncontrolled diabetes mellitus with peripheral circulatory disorder 03/11/2009  . CKD (chronic kidney disease), stage III 06/19/2008  . BACK PAIN, LUMBAR 06/12/2008  . 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 GOUT 07/27/2006  . DVT 07/27/2006  . CORONARY ARTERY BYPASS GRAFT, HX OF 07/27/2006    ROS   Patient denies fever, chills, headache, sweats, rash, change in vision, change in hearing, chest pain, cough, nausea vomiting, urinary symptoms. All other systems are reviewed and are negative.  PHYSICAL EXAM  Patient is oriented to person time and place. Affect is normal. He is here with his wife. There is no jugulovenous distention. Lungs are clear. Respiratory effort is nonlabored. Cardiac exam reveals S1 and S2. There no clicks or significant murmurs. The abdomen is protuberant. There is no peripheral edema.  Filed Vitals:  03/04/13 1422  BP: 118/60  Pulse: 68  Height: 6' (1.829 m)  Weight: 248 lb (112.492 kg)     ASSESSMENT & PLAN

## 2013-03-04 NOTE — Assessment & Plan Note (Signed)
His volume status is controlled currently. No change in therapy.

## 2013-03-05 ENCOUNTER — Encounter (INDEPENDENT_AMBULATORY_CARE_PROVIDER_SITE_OTHER): Payer: Self-pay | Admitting: Surgery

## 2013-03-05 ENCOUNTER — Ambulatory Visit (INDEPENDENT_AMBULATORY_CARE_PROVIDER_SITE_OTHER): Payer: Medicare Other | Admitting: Surgery

## 2013-03-05 ENCOUNTER — Ambulatory Visit: Payer: Medicare Other | Admitting: Internal Medicine

## 2013-03-05 VITALS — BP 130/78 | HR 68 | Temp 98.0°F | Resp 18 | Ht 72.0 in | Wt 248.0 lb

## 2013-03-05 DIAGNOSIS — N492 Inflammatory disorders of scrotum: Secondary | ICD-10-CM | POA: Insufficient documentation

## 2013-03-05 DIAGNOSIS — N498 Inflammatory disorders of other specified male genital organs: Secondary | ICD-10-CM

## 2013-03-05 NOTE — Progress Notes (Signed)
This is a patient that was followed by Dr. Magnus Ivan earlier this year for a scrotal abscess. The patient has a very long list of medical problems. He is chronically anticoagulated. He had dressing changes performed to his scrotal abscess until it healed. Over the last week this has begun to swell up and yesterday it spontaneously burst and drained a large amount of bloody-appearing fluid. It feels much better now that it has burst and drained. He comes in today for urgent office evaluation.  Filed Vitals:   03/05/13 1536  BP: 130/78  Pulse: 68  Temp: 98 F (36.7 C)  Resp: 18   At the base of his penis adjacent to the median raphae, there is a 2 cm opening with some visible granulation tissue. This tracked posteriorly towards the anus by about 1 cm. The overlying skin appears normal. We packed this wound with Nu Gauze. His wife will change it once a day until healed. We will schedule him to see Dr. Magnus Ivan in one to 2 weeks for further followup.  Wilmon Arms. Corliss Skains, MD, [facility]  General/ Trauma Surgery  03/05/2013 5:04 PM

## 2013-03-06 ENCOUNTER — Other Ambulatory Visit: Payer: Self-pay | Admitting: *Deleted

## 2013-03-06 MED ORDER — ALLOPURINOL 100 MG PO TABS: 100.0000 mg | ORAL_TABLET | Freq: Every day | ORAL | Status: DC

## 2013-03-06 NOTE — Telephone Encounter (Signed)
Rx printed and given to the pt's wife.//AB/CMA 

## 2013-03-08 ENCOUNTER — Other Ambulatory Visit: Payer: Self-pay | Admitting: *Deleted

## 2013-03-08 ENCOUNTER — Telehealth: Payer: Self-pay | Admitting: *Deleted

## 2013-03-08 NOTE — Interval H&P Note (Signed)
ICD Criteria  Current LVEF (within 6 months):40%  NYHA Functional Classification: Class II  Heart Failure History:  Yes, Duration of heart failure since onset is > 9 months  Non-Ischemic Dilated Cardiomyopathy History:  No.  Atrial Fibrillation/Atrial Flutter:  Yes, A-Fib/A-Flutter type: Permanent (>1 year).  Ventricular Tachycardia History:  Yes, Hemodynamic status unknown. VT Type:  SVT - Monomorphic.  Cardiac Arrest History:  No  History of Syndromes with Risk of Sudden Death:  No.  Previous ICD:  Yes, ICD Type:  CRT-D, Reason for ICD:  Primary prevention.  25%  Electrophysiology Study: No.  Prior MI: No.  PPM: No.  OSA:  No  Patient Life Expectancy of >=1 year: Yes.  Anticoagulation Therapy:  Patient is on anticoagulation therapy, anticoagulation was NOT held prior to procedure.   Beta Blocker Therapy:  Yes.   Ace Inhibitor/ARB Therapy:  Yes.History and Physical Interval Note:  03/08/2013 3:20 PM  [name]  has presented today for surgery, with the diagnosis of chf  The various methods of treatment have been discussed with the patient and family. After consideration of risks, benefits and other options for treatment, the patient has consented to  Procedure(s): ICD GENERATOR CHANGE (N/A) as a surgical intervention .  The patient's history has been reviewed, patient examined, no change in status, stable for surgery.  I have reviewed the patient's chart and labs.  Questions were answered to the patient's satisfaction.     [name]

## 2013-03-08 NOTE — Telephone Encounter (Signed)
Reduce Lantus to 32 units

## 2013-03-08 NOTE — Telephone Encounter (Signed)
Patients wife called, she said her husbands fasting am readings are very low, they have been 85, 76, and 81, she wants to know if they should cut back on the nighttime insuline dose?  She said the rest of the readings are good.

## 2013-03-11 NOTE — Telephone Encounter (Signed)
**Note De-Identified [name] Obfuscation** Weight= 236 lbs.  The pt states that he continues to have right leg edema and very little SOB at this time.

## 2013-03-12 NOTE — Telephone Encounter (Signed)
**Note De-identified Lakelynn Severtson Obfuscation** LMTCB

## 2013-03-15 ENCOUNTER — Encounter: Payer: Self-pay | Admitting: Internal Medicine

## 2013-03-15 ENCOUNTER — Ambulatory Visit (INDEPENDENT_AMBULATORY_CARE_PROVIDER_SITE_OTHER): Payer: Medicare Other | Admitting: Internal Medicine

## 2013-03-15 ENCOUNTER — Ambulatory Visit (INDEPENDENT_AMBULATORY_CARE_PROVIDER_SITE_OTHER)
Admission: RE | Admit: 2013-03-15 | Discharge: 2013-03-15 | Disposition: A | Discharge status: Home / Self Care | Payer: Medicare Other | Source: Ambulatory Visit | Attending: Internal Medicine | Admitting: Internal Medicine

## 2013-03-15 VITALS — BP 91/59 | HR 67 | Temp 98.0°F | Resp 16 | Wt 250.0 lb

## 2013-03-15 DIAGNOSIS — J209 Acute bronchitis, unspecified: Secondary | ICD-10-CM

## 2013-03-15 DIAGNOSIS — R0989 Other specified symptoms and signs involving the circulatory and respiratory systems: Secondary | ICD-10-CM

## 2013-03-15 LAB — CBC WITH DIFFERENTIAL/PLATELET
Basophils Relative: 0 % (ref 0–1)
Eosinophils Absolute: 0.1 10*3/uL (ref 0.0–0.7)
Eosinophils Relative: 2 % (ref 0–5)
HCT: 43.6 % (ref 39.0–52.0)
Hemoglobin: 14.7 g/dL (ref 13.0–17.0)
Lymphs Abs: 1.4 10*3/uL (ref 0.7–4.0)
MCH: 30.1 pg (ref 26.0–34.0)
Monocytes Absolute: 0.9 10*3/uL (ref 0.1–1.0)
Monocytes Relative: 13 % — ABNORMAL HIGH (ref 3–12)
Neutro Abs: 4.3 10*3/uL (ref 1.7–7.7)
Neutrophils Relative %: 64 % (ref 43–77)
RBC: 4.88 MIL/uL (ref 4.22–5.81)
RDW: 13.9 % (ref 11.5–15.5)

## 2013-03-15 MED ORDER — AZITHROMYCIN 250 MG PO TABS: ORAL_TABLET | ORAL | Status: DC

## 2013-03-15 MED ORDER — HYDROCODONE-HOMATROPINE 5-1.5 MG/5ML PO SYRP: 5.0000 mL | ORAL_SOLUTION | Freq: Four times a day (QID) | ORAL | Status: DC | PRN

## 2013-03-15 NOTE — Progress Notes (Signed)
   Subjective:    Patient ID: [name], male    DOB: Oct 08, 1940, 72 y.o.   MRN: [id number]  HPI  His symptoms began approximately one week ago as a very deep, rattly cough which was nonproductive. Over the last 24+ hours he has had some scant sputum production. He's also had some slight sneezing as well as slight wheezing.  He's been using Delsym or Mucinex with minimal response  He quit smoking in 1997. He is on losartan but not an ACE inhibitor.  PT/INR was 1.7 on 12/15; that day he took extra 2.5 mg   Review of Systems The sneezing is not associated itchy, watery eyes. He denies fever, chills, or sweats. There is no associated pain in the frontal sinuses, maxillary sinuses, purulent nasal discharge , postnasal drainage,.  The cough is not associated with shortness of breath, pleuritic pain, or hemoptysis.  Dyspepsia is not significant.       Objective:   Physical Exam General appearance:good health ;adequately  nourished; no acute distress or increased work of breathing is present.  No  lymphadenopathy about the head, neck, or axilla noted.   Eyes: No conjunctival inflammation or lid edema is present.   Ears:  Hearing aids bilaterally  Nose:  External nasal examination shows no deformity or inflammation. Nasal mucosa are pink and moist without lesions or exudates. No septal dislocation or deviation.No obstruction to airflow.   Oral exam: Dentures; lips and gums are healthy appearing.There is no oropharyngeal erythema or exudate noted.   Neck:  No deformities, masses, or tenderness noted.     Heart:  Normal rate and regular rhythm. S1 and S2 normal without gallop, murmur, click, rub or other extra sounds.   Lungs:  Coarse rub like rhonchi over the posterior chest and all areas. No definite E to A changes with bronchography testing. No abnormalities to tactile fremitus testing  Extremities:  No cyanosis or clubbing  noted    Skin: Warm & dry w/o jaundice or  tenting.         Assessment & Plan:  #1 bronchitis with significant rub like rhonchi  Plan: See orders

## 2013-03-15 NOTE — Patient Instructions (Addendum)
Order for x-rays entered into  the computer; these will be performed at [address] [facility] across from [facility]. No appointment is necessary.   For your Bride :Plain Mucinex (NOT D) for thick secretions ;force NON dairy fluids .   Nasal cleansing in the shower as discussed with lather of mild shampoo.After 10 seconds wash off lather while  exhaling through nostrils. Make sure that all residual soap is removed to prevent irritation.  Nasacort AQ OTC 1 spray in each nostril twice a day as needed. Use the "crossover" technique into opposite nostril spraying toward opposite ear @ 45 degree angle, not straight up into nostril.  Use a Neti pot daily only  as needed for significant sinus congestion; going from open side to congested side . Plain Allegra (NOT D )  160 daily , Loratidine 10 mg , OR Zyrtec 10 mg @ bedtime  as needed for itchy eyes & sneezing.

## 2013-03-15 NOTE — Addendum Note (Signed)
Addended by: Verdie Shire on: 03/15/2013 03:04 PM   Modules accepted: Orders

## 2013-03-20 ENCOUNTER — Other Ambulatory Visit: Payer: Self-pay | Admitting: *Deleted

## 2013-03-20 ENCOUNTER — Encounter: Payer: Self-pay | Admitting: Internal Medicine

## 2013-03-20 ENCOUNTER — Telehealth: Payer: Self-pay | Admitting: Internal Medicine

## 2013-03-20 DIAGNOSIS — J209 Acute bronchitis, unspecified: Secondary | ICD-10-CM

## 2013-03-20 MED ORDER — AZITHROMYCIN 250 MG PO TABS: ORAL_TABLET | ORAL | Status: DC

## 2013-03-20 NOTE — Telephone Encounter (Signed)
Can refill but take 1 pill qd X 6 days PT/INR in 5 days

## 2013-03-20 NOTE — Telephone Encounter (Signed)
Z-pak e-scribed to pharmacy. Pt called and message was left to inform patient of this and to please have PT/INR checked in 5 days. JG//CMA

## 2013-03-20 NOTE — Telephone Encounter (Signed)
Please advise 

## 2013-03-20 NOTE — Telephone Encounter (Signed)
Patient's wife is calling on his behalf and states that he has finished his Z-pack but still has the same symptoms. She wants to know if Dr. Alwyn Ren would rx the patient another Zpack. Please advise.

## 2013-03-25 ENCOUNTER — Telehealth: Payer: Self-pay | Admitting: Cardiology

## 2013-03-25 ENCOUNTER — Ambulatory Visit (INDEPENDENT_AMBULATORY_CARE_PROVIDER_SITE_OTHER): Payer: Medicare Other | Admitting: *Deleted

## 2013-03-25 VITALS — BP 120/60 | HR 62 | Temp 98.6°F | Wt 251.0 lb

## 2013-03-25 DIAGNOSIS — Z7901 Long term (current) use of anticoagulants: Secondary | ICD-10-CM

## 2013-03-25 DIAGNOSIS — I4891 Unspecified atrial fibrillation: Secondary | ICD-10-CM

## 2013-03-25 LAB — POCT INR: INR: 2.3

## 2013-03-25 NOTE — Telephone Encounter (Signed)
New message     Pt is returning lynn's call

## 2013-03-25 NOTE — Patient Instructions (Signed)
Take 1 tablet everyday except 2 tablets on Wednesdays. Recheck in 4 weeks.

## 2013-03-27 ENCOUNTER — Encounter (INDEPENDENT_AMBULATORY_CARE_PROVIDER_SITE_OTHER): Payer: Self-pay | Admitting: Surgery

## 2013-03-27 ENCOUNTER — Ambulatory Visit (INDEPENDENT_AMBULATORY_CARE_PROVIDER_SITE_OTHER): Payer: Medicare Other | Admitting: Surgery

## 2013-03-27 VITALS — BP 127/81 | HR 74 | Temp 97.7°F | Resp 18 | Ht 72.0 in | Wt 247.8 lb

## 2013-03-27 DIAGNOSIS — N498 Inflammatory disorders of other specified male genital organs: Secondary | ICD-10-CM

## 2013-03-27 DIAGNOSIS — N492 Inflammatory disorders of scrotum: Secondary | ICD-10-CM

## 2013-03-27 NOTE — Telephone Encounter (Signed)
03/25/13  LMTCB  03/27/13  Weight= 239 lbs.  The pts wife, [name], states that the pt continues to have right leg edema and no SOB at this time.

## 2013-03-27 NOTE — Progress Notes (Signed)
Subjective:     Patient ID: [name], male   DOB: January 12, 1941, 73 y.o.   MRN: [id number]  HPI He is here for a followup visit. I actually saw him last in May of 2014 with his scrotal wound had healed. He developed another abscess in the same spot in December which spontaneously drained on its on. He was seen in the office by one of my partners and wound packing was begun. He is now doing well and has no complaints.  Review of Systems     Objective:   Physical Exam The wound in the scrotum is now completely healed again with no evidence of cellulitis or infection    Assessment:     Healed scrotal abscess     Plan:     Given the fact that he has multiple medical issues and is on Coumadin, he is not a candidate for excision of this area. Should he develop any cellulitis he will call a second we will call antibiotics and see him back as soon as possible

## 2013-03-28 ENCOUNTER — Other Ambulatory Visit (INDEPENDENT_AMBULATORY_CARE_PROVIDER_SITE_OTHER): Payer: Medicare Other

## 2013-03-28 DIAGNOSIS — E1151 Type 2 diabetes mellitus with diabetic peripheral angiopathy without gangrene: Secondary | ICD-10-CM

## 2013-03-28 DIAGNOSIS — I798 Other disorders of arteries, arterioles and capillaries in diseases classified elsewhere: Secondary | ICD-10-CM

## 2013-03-28 DIAGNOSIS — E1159 Type 2 diabetes mellitus with other circulatory complications: Secondary | ICD-10-CM

## 2013-03-28 DIAGNOSIS — IMO0002 Reserved for concepts with insufficient information to code with codable children: Secondary | ICD-10-CM

## 2013-03-28 DIAGNOSIS — E1165 Type 2 diabetes mellitus with hyperglycemia: Principal | ICD-10-CM

## 2013-03-28 LAB — COMPREHENSIVE METABOLIC PANEL
ALK PHOS: 58 U/L (ref 39–117)
ALT: 11 U/L (ref 0–53)
AST: 14 U/L (ref 0–37)
Albumin: 3.4 g/dL — ABNORMAL LOW (ref 3.5–5.2)
BUN: 24 mg/dL — AB (ref 6–23)
CO2: 28 mEq/L (ref 19–32)
Calcium: 9 mg/dL (ref 8.4–10.5)
Chloride: 107 mEq/L (ref 96–112)
Creatinine, Ser: 1.4 mg/dL (ref 0.4–1.5)
GFR: 51.68 mL/min — ABNORMAL LOW (ref >60.00)
Glucose, Bld: 126 mg/dL — ABNORMAL HIGH (ref 70–99)
Potassium: 4.3 mEq/L (ref 3.5–5.1)
SODIUM: 143 meq/L (ref 135–145)
TOTAL PROTEIN: 6.5 g/dL (ref 6.0–8.3)
Total Bilirubin: 0.9 mg/dL (ref 0.3–1.2)

## 2013-03-28 LAB — HEMOGLOBIN A1C: Hgb A1c MFr Bld: 7.3 % — ABNORMAL HIGH (ref 4.6–6.5)

## 2013-03-29 ENCOUNTER — Other Ambulatory Visit: Payer: Self-pay | Admitting: Cardiology

## 2013-03-29 NOTE — Telephone Encounter (Signed)
Weight= 237 lbs.  The pts wife, Jim, states that the pt continues to have right leg edema and no SOB at this time.

## 2013-04-01 ENCOUNTER — Ambulatory Visit: Payer: Medicare Other | Admitting: Endocrinology

## 2013-04-01 ENCOUNTER — Telehealth (INDEPENDENT_AMBULATORY_CARE_PROVIDER_SITE_OTHER): Payer: Self-pay | Admitting: General Surgery

## 2013-04-01 ENCOUNTER — Encounter: Payer: Medicare Other | Admitting: *Deleted

## 2013-04-01 NOTE — Telephone Encounter (Signed)
LMOM for patient to call back and ask for [name], patient has a question about last week visit

## 2013-04-01 NOTE — Telephone Encounter (Signed)
**Note De-Identified [name] Obfuscation** Weight= 239 lbs. The pts wife, [name], states that the pt has continued right leg swelling and no SOB at this time.

## 2013-04-02 ENCOUNTER — Encounter (INDEPENDENT_AMBULATORY_CARE_PROVIDER_SITE_OTHER): Payer: Self-pay | Admitting: General Surgery

## 2013-04-02 ENCOUNTER — Ambulatory Visit (INDEPENDENT_AMBULATORY_CARE_PROVIDER_SITE_OTHER): Payer: Medicare Other | Admitting: General Surgery

## 2013-04-02 VITALS — BP 122/82 | HR 60 | Temp 97.3°F | Resp 12 | Ht 72.0 in | Wt 247.0 lb

## 2013-04-02 DIAGNOSIS — N498 Inflammatory disorders of other specified male genital organs: Secondary | ICD-10-CM

## 2013-04-02 DIAGNOSIS — N492 Inflammatory disorders of scrotum: Secondary | ICD-10-CM

## 2013-04-02 MED ORDER — DOXYCYCLINE HYCLATE 100 MG PO CAPS: 100.0000 mg | ORAL_CAPSULE | Freq: Two times a day (BID) | ORAL | Status: DC

## 2013-04-02 NOTE — Patient Instructions (Signed)
The recent scrotal abscess has now evacuated and there is no immediate need for an operation.  As we saw today, there are 2 openings and they connect with each other. I suspect that he may eventually need an operation to cut this completely open. This would need to be done in the operating room after being off of Coumadin for a few days, If okay with Dr. Dola Argyle.  For now, we are going to put him on doxycycline antibiotic for 2 weeks.  Performe warm soaks as discussed 3 times a day  Keep a dry gauze bandage over the wounds to collect any drainage.  He will be given an appointment to see Dr. Ninfa Linden in approximately 2 weeks.

## 2013-04-02 NOTE — Progress Notes (Signed)
Patient ID: [name], male   DOB: Mar 25, 1940, 73 y.o.   MRN: [id number] History: This gentleman returns with recurrent scrotal soft tissue infection. He has had at least 3 episodes of scrotal infection in the past, all treated conservatively. Recent episode resolved. He was seen by Dr. Arvilla Market last week and the wound looked fine. His wife says that another opening more posteriorly opened up and began to drain purulent fluid. The patient stated that it was painful but now is not. Denies fever or chills. On Coumadin for multiple cardiovascular problems.Diabetic. Peripheral neuropathy. History coronary artery bypass. History AAA. History intracardiac defibrillator procedure.  Exam: Patient here with wife. He is alert and in no distress. Afebrile Abdomen soft and nontender Inguinal areas and penis are normal Scrotum is soft but at the base of the scrotum in the midline there are 2 openings. There is no odor or purulence right now and minimal cellulitis. Serosanguineous drainage. Not really tender.There is no palpable mass. The rectal probe  passed in between the 2 openings demonstrating that hey communicate.  Assessment: Recurrent scrotal infections. There are 2 external openings that communicate, suggesting a superficial fistula-type process. I suspect that these will eventually need to be opened up in the OR under good control off Coumadin, Assuming that this is approved by Dr. Dola Argyle. No immediate acute surgical me or emergency  Plan: Warm soaks 3 times a day Cover with dry gauze and a light bandage Doxycycline 100 mg twice a day for 2 weeks Return to see Dr. Ninfa Linden in 2 weeks.    Edsel Petrin. Dalbert Batman, M.D., [facility] General and Minimally invasive Surgery Breast and Colorectal Surgery Office:   [phone] Pager:   [phone]

## 2013-04-03 ENCOUNTER — Other Ambulatory Visit: Payer: Self-pay | Admitting: Cardiology

## 2013-04-03 ENCOUNTER — Encounter: Payer: Self-pay | Admitting: Endocrinology

## 2013-04-03 ENCOUNTER — Ambulatory Visit (INDEPENDENT_AMBULATORY_CARE_PROVIDER_SITE_OTHER): Payer: Medicare Other | Admitting: Endocrinology

## 2013-04-03 ENCOUNTER — Other Ambulatory Visit: Payer: Self-pay | Admitting: *Deleted

## 2013-04-03 VITALS — BP 124/74 | HR 84 | Temp 98.3°F | Resp 14 | Ht 72.0 in | Wt 247.9 lb

## 2013-04-03 DIAGNOSIS — I798 Other disorders of arteries, arterioles and capillaries in diseases classified elsewhere: Secondary | ICD-10-CM

## 2013-04-03 DIAGNOSIS — E1151 Type 2 diabetes mellitus with diabetic peripheral angiopathy without gangrene: Secondary | ICD-10-CM

## 2013-04-03 DIAGNOSIS — N183 Chronic kidney disease, stage 3 unspecified: Secondary | ICD-10-CM

## 2013-04-03 DIAGNOSIS — IMO0002 Reserved for concepts with insufficient information to code with codable children: Secondary | ICD-10-CM

## 2013-04-03 DIAGNOSIS — E1159 Type 2 diabetes mellitus with other circulatory complications: Secondary | ICD-10-CM

## 2013-04-03 DIAGNOSIS — E1165 Type 2 diabetes mellitus with hyperglycemia: Principal | ICD-10-CM

## 2013-04-03 MED ORDER — ACCU-CHEK FASTCLIX LANCETS MISC: Status: DC

## 2013-04-03 MED ORDER — GLUCOSE BLOOD VI STRP: ORAL_STRIP | Status: DC

## 2013-04-03 NOTE — Progress Notes (Signed)
Patient ID: [name], male   DOB: 1940/10/26, 73 y.o.   MRN: [id number]  Reason for Appointment : F/u for Type 2 Diabetes  History of Present Illness          Diagnosis: Type 2 diabetes mellitus, date of diagnosis: 2008       Past history: He was initially treated with Metformin and also given Amayl at some point. With this he had fair control of his diabetes with A1c had usually been over 7% except once in 2013.  He was taken off metformin probably in March this year after a hospitalization, possibly because of renal dysfunction He was started on Levemir insulin in May when his blood sugars were significantly higher, fasting readings averaging 220.  Levemir progressively increased and was on 35 units at night since 12/20/12 He has not been on any other medications either orally or injectable for his diabetes  Recent history:    Because of his A1c of over 10% in 10/14 on basal insulin alone he was started on NovoLog with each meal in 10/14 He was instructed by the diabetes nurse educator recently and he was instructed on how to titrate his NovoLog to keep his postprandial readings at least below 200. He has increased his NovoLog from a starting dose of 6 units to 12 units with significant improvement in his postprandial readings Still has some high readings after supper and lunch based on what he is eating. Lowest postprandial reading was 114 with eating salad and little carbohydrate Levemir dosage was increased by 2 units and fasting readings are near normal now  INSULIN regimen is described as:  30 Levemir hs NovoLog 8 units ac  Glucose monitoring:  done 3 time a day        Glucometer:  FreeStyle.     Blood Glucose readings from his diary: FASTING recently: 108-123, average 110 P.c. lunch  97-167, p.c. supper  133-172  Hypoglycemia frequency:  none.           Self-care: The diet that the patient has been following is: Smaller portions and relatively balanced meals  Meals: 2  meals per day. Noon and 6 pm  Low fat, trying to eat more salads. Avoiding drinks with sugar and no juices       Physical activity: exercise: Minimal, limited by back pain           Dietician visit: Most recent: Unknown.  CDE visit: 11/14               Compliance with the medical regimen: Good  Retinal exam: Most recent: 6/14, reportedly normal  Weight control: This appears to be fluctuating significantly  Wt Readings from Last 3 Encounters:  04/03/13 247 lb 14.4 oz (112.447 kg)  04/02/13 247 lb (112.038 kg)  03/27/13 247 lb 12.8 oz (112.401 kg)    Lab Results  Component Value Date   HGBA1C 7.3* 03/28/2013   HGBA1C 10.2* 12/19/2012   HGBA1C 7.4* 08/07/2012   Lab Results  Component Value Date   MICROALBUR 0.5 02/01/2013   LDLCALC 78 08/06/2012   CREATININE 1.4 03/28/2013       Medication List       This list is accurate as of: 04/03/13  1:23 PM.  Always use your most recent med list.               ACCU-CHEK FASTCLIX LANCETS Misc  Use to check blood sugars 3 times per day     allopurinol 100  MG tablet  Commonly known as:  ZYLOPRIM  Take 1 tablet (100 mg total) by mouth daily.     amLODipine 5 MG tablet  Commonly known as:  NORVASC     aspirin 81 MG tablet  Take 81 mg by mouth daily.     atorvastatin 10 MG tablet  Commonly known as:  LIPITOR     carvedilol 25 MG tablet  Commonly known as:  COREG     chlorthalidone 25 MG tablet  Commonly known as:  HYGROTON     Cholecalciferol 10000 UNITS Caps  Take 1,000 Units by mouth daily.     COLCRYS 0.6 MG tablet  Generic drug:  colchicine     doxycycline 100 MG capsule  Commonly known as:  VIBRAMYCIN  Take 1 capsule (100 mg total) by mouth 2 (two) times daily.     furosemide 40 MG tablet  Commonly known as:  LASIX     gabapentin 100 MG capsule  Commonly known as:  NEURONTIN     glucose blood test strip  Commonly known as:  ACCU-CHEK SMARTVIEW  Use as instructed to check blood sugars 3 times per day dx code  250.72     ipratropium 0.06 % nasal spray  Commonly known as:  ATROVENT     KLOR-CON M20 20 MEQ tablet  Generic drug:  potassium chloride SA     potassium chloride SA 20 MEQ tablet  Commonly known as:  KLOR-CON M20  Take 1 tablet (20 mEq total) by mouth 3 (three) times daily.     LEVEMIR FLEXPEN 100 UNIT/ML Pen  Generic drug:  Insulin Detemir  Inject 30 Units into the skin at bedtime.     losartan 50 MG tablet  Commonly known as:  COZAAR     naphazoline-pheniramine 0.025-0.3 % ophthalmic solution  Commonly known as:  NAPHCON-A  Place 1 drop into both eyes daily as needed for irritation.     nitroGLYCERIN 0.4 MG SL tablet  Commonly known as:  NITROSTAT  Place 0.4 mg under the tongue every 5 (five) minutes as needed for chest pain.     NOVOLOG FLEXPEN 100 UNIT/ML FlexPen  Generic drug:  insulin aspart  Inject 8 Units into the skin 2 (two) times daily.     warfarin 2.5 MG tablet  Commonly known as:  COUMADIN  5mg  qd exp wed.        Allergies:  Allergies  Allergen Reactions  . Avelox [Moxifloxacin Hcl In Nacl] Swelling, Rash and Other (See Comments)    Patient became hypotensive after infusion started Because of a history of documented adverse serious drug reaction;Medi Alert bracelet  is recommended  . Penicillins     REACTION: anaphylaxis Because of a history of documented adverse serious drug reaction;Medi Alert bracelet  is recommended    Past Medical History  Diagnosis Date  . Diabetes mellitus   . Hyperlipidemia   . Hypertension   . Pilonidal cyst   . Atrial fibrillation     Previous long-term amiodarone therapy with multiple cardioversions / amiodarone stopped September, 2009  . Atrial flutter     Started November, 2010, Left-sided and cannot ablate  . Left atrial thrombus     Remote past... cardioversions done since that time  . Wide-complex tachycardia   . Left ventricular ejection fraction less than 40%   . Gout   . AAA (abdominal aortic aneurysm)      Surgical repair  . Discolored skin   . S/P ICD (internal cardiac  defibrillator) procedure     Dr. Lovena Le 2009... by the pacing  . SOB (shortness of breath)     Large left effusion/ thoracentesis/hospitalization/November, 2011... exudated.. cytology negative.. Dr.Wert.. no proof of mesothelioma  . Pericardial effusion   . Pleural effusion     Large loculated effusion on the left side November, 2011. This was tapped. It was exudative. Cytology revealed no cancer no proof of mesothelioma area pulmonary team felt that no further workup was needed  . S/P AAA repair   . Spinal stenosis     Surgery Dr.Elsner  . CAD (coronary artery disease)     Catheterization July, 2008... name and vein grafts patent but low cardiac output  . Warfarin anticoagulation   . Cardiomyopathy     Ischemic... ICD  . Combined systolic and diastolic CHF   . Venous insufficiency     Toe discoloration chronic  . Mitral regurgitation     Mild echo  . Aortic valve sclerosis   . Nasal drainage     Chronic  . Alcohol ingestion of more than four drinks per week     Excess beer  not a dependency problem  . Chronotropic incompetence     IV pacing rate adjusted  . Thrombophlebitis of superficial veins of upper extremities     Possible venous stenosis from defibrillator  . Pericardial effusion     November, 2011 .. decreased during hospitalization  . Eye abnormality     Ophthalmologist questions a clot in one of his eyes, May, 2012  . Overweight     November, 2012  . Pleural thickening   . Ejection fraction     Ejection fraction has varied over time from 35-50%.,, Echoes are technically very difficult,,, EF 50%, echo, May 25, 2011, technically very difficult  . Drug therapy     Redness and swelling with Avelox infusion May 24, 2011  . COPD (chronic obstructive pulmonary disease)   . Myocardial infarction   . Spinal cord stimulator status     October, 2013  . Carotid artery disease     Doppler, December,  2013, 0-39% bilateral  . Pneumonia   . Arthritis   . Ventral hernia     April, 2014, result of his abdominal surgery  . Bony abnormality     Patient's manubrium is slightly displaced to the right    Past Surgical History  Procedure Laterality Date  . Colonoscopy w/ polypectomy    . Lumbar fusion    . Pilonidal cyst removal    . Surgery scrotal / testicular    . Coronary artery bypass graft  2004  . Abdominal aortic aneurysm repair  11/2002  . Bi-ventricular pacemaker insertion (crt-p)  02-11-2013    Pt with previously implanted MDT CRTD downgraded to CRTP by Dr Lovena Le 02-11-13  . Back surgery    . Incision and drainage abscess / hematoma of bursa / knee / thigh      Family History  Problem Relation Age of Onset  . Hypertension Mother   . Stroke Mother   . Diabetes Father   . Coronary artery disease Father   . Other Father     DVT    Social History:  reports that he quit smoking about 18 years ago. He has quit using smokeless tobacco. He reports that he drinks about 1.2 ounces of alcohol per week. He reports that he does not use illicit drugs.    Review of Systems       Lipids: Treated  with Lipitor, LDL controlled Lab Results  Component Value Date   LDLCALC 78 08/06/2012                  The blood pressure has been under control with current regimen of chlorthalidone, amlodipine and losartan      He has had pains and tingling in his feet and lower legs. Has known neuropathy  HYPOKALEMIA: He is already on 20 mg twice a day of potassium but is also on furosemide on and off and chlorthalidone. No muscle cramps   LABS:  Appointment on 03/28/2013  Component Date Value Range Status  . Hemoglobin A1C 03/28/2013 7.3* 4.6 - 6.5 % Final   Glycemic Control Guidelines for People with Diabetes:Non Diabetic:  <6%Goal of Therapy: <7%Additional Action Suggested:  >8%   . Sodium 03/28/2013 143  135 - 145 mEq/L Final  . Potassium 03/28/2013 4.3  3.5 - 5.1 mEq/L Final  . Chloride  03/28/2013 107  96 - 112 mEq/L Final  . CO2 03/28/2013 28  19 - 32 mEq/L Final  . Glucose, Bld 03/28/2013 126* 70 - 99 mg/dL Final  . BUN 03/28/2013 24* 6 - 23 mg/dL Final  . Creatinine, Ser 03/28/2013 1.4  0.4 - 1.5 mg/dL Final  . Total Bilirubin 03/28/2013 0.9  0.3 - 1.2 mg/dL Final  . Alkaline Phosphatase 03/28/2013 58  39 - 117 U/L Final  . AST 03/28/2013 14  0 - 37 U/L Final  . ALT 03/28/2013 11  0 - 53 U/L Final  . Total Protein 03/28/2013 6.5  6.0 - 8.3 g/dL Final  . Albumin 03/28/2013 3.4* 3.5 - 5.2 g/dL Final  . Calcium 03/28/2013 9.0  8.4 - 10.5 mg/dL Final  . GFR 03/28/2013 51.68* >60.00 mL/min Final    Physical Examination:  BP 124/74  Pulse 84  Temp(Src) 98.3 F (36.8 C)  Resp 14  Ht 6' (1.829 m)  Wt 247 lb 14.4 oz (112.447 kg)  BMI 33.61 kg/m2  SpO2 95%  No pedal edema    ASSESSMENT:  Diabetes type 2, uncontrolled 1/15  His blood sugar control is overall excellent now with continuing mealtime insulin in addition to his Levemir Probably because of reduced glucose toxicity he appears to be needing less insulin overall including basal insulin Has reduced NovoLog by 4 units and last night glucose was excellent even without any NovoLog for eating a salad Has been checking his blood sugars fairly consistently and these are not fluctuating as much after meals A1c is also come down about 3% over the last 3 months At the same time he has not gained any weight  Hypertension: Well controlled  Mild CKD: Creatinine stable about 1.5; medication management done by PCP and cardiologist Also has had a tendency to hypokalemia, now improved.  PLAN:   He will not change his Levemir since fasting readings are fairly consistent just over 100 Also his dose of NovoLog to 8 units is usually appropriate for his usual meals He can adjust the dose based on the carbohydrate content and the dose if eating only salads without carbohydrate He was instructed on using the Accu-Chek nano  which is covered by his insurance  Sueo Cullen 04/03/2013, 1:23 PM

## 2013-04-03 NOTE — Patient Instructions (Signed)
May skip Novolog for salads May check am sugar every 2 days  Appointment on 03/28/2013  Component Date Value Range Status  . Hemoglobin A1C 03/28/2013 7.3* 4.6 - 6.5 % Final   Glycemic Control Guidelines for People with Diabetes:Non Diabetic:  <6%Goal of Therapy: <7%Additional Action Suggested:  >8%   . Sodium 03/28/2013 143  135 - 145 mEq/L Final  . Potassium 03/28/2013 4.3  3.5 - 5.1 mEq/L Final  . Chloride 03/28/2013 107  96 - 112 mEq/L Final  . CO2 03/28/2013 28  19 - 32 mEq/L Final  . Glucose, Bld 03/28/2013 126* 70 - 99 mg/dL Final  . BUN 03/28/2013 24* 6 - 23 mg/dL Final  . Creatinine, Ser 03/28/2013 1.4  0.4 - 1.5 mg/dL Final  . Total Bilirubin 03/28/2013 0.9  0.3 - 1.2 mg/dL Final  . Alkaline Phosphatase 03/28/2013 58  39 - 117 U/L Final  . AST 03/28/2013 14  0 - 37 U/L Final  . ALT 03/28/2013 11  0 - 53 U/L Final  . Total Protein 03/28/2013 6.5  6.0 - 8.3 g/dL Final  . Albumin 03/28/2013 3.4* 3.5 - 5.2 g/dL Final  . Calcium 03/28/2013 9.0  8.4 - 10.5 mg/dL Final  . GFR 03/28/2013 51.68* >60.00 mL/min Final

## 2013-04-05 ENCOUNTER — Other Ambulatory Visit: Payer: Self-pay | Admitting: *Deleted

## 2013-04-05 ENCOUNTER — Other Ambulatory Visit: Payer: Self-pay

## 2013-04-05 MED ORDER — ATORVASTATIN CALCIUM 10 MG PO TABS: 10.0000 mg | ORAL_TABLET | Freq: Every day | ORAL | Status: DC

## 2013-04-05 NOTE — Telephone Encounter (Signed)
**Note De-Identified Tanja Gift Obfuscation** Weight= 239 lbs.  The pts wife, Dammon, states that there has been no change in the pts right leg edema and that he is not SOB at this time.

## 2013-04-10 NOTE — Telephone Encounter (Signed)
LMTCB

## 2013-04-11 NOTE — Telephone Encounter (Signed)
Follow Up:  Pt's wife states she is returning a call to [place].

## 2013-04-12 NOTE — Telephone Encounter (Signed)
**Note De-Identified [name] Obfuscation** The pt is a CHF pt and his wife calls to report his daily weights.

## 2013-04-12 NOTE — Telephone Encounter (Signed)
Was unable to reach pt or his wife to obtain the pts weights this week. I left messages on his phone yesterday and today to call office. Will call next week.

## 2013-04-15 ENCOUNTER — Encounter (INDEPENDENT_AMBULATORY_CARE_PROVIDER_SITE_OTHER): Payer: Medicare Other | Admitting: Surgery

## 2013-04-17 ENCOUNTER — Telehealth: Payer: Self-pay | Admitting: Cardiology

## 2013-04-17 NOTE — Telephone Encounter (Signed)
New problem   Pt returning your call from friday

## 2013-04-17 NOTE — Telephone Encounter (Signed)
Pt advised, he verbalized understanding. 

## 2013-04-19 ENCOUNTER — Other Ambulatory Visit: Payer: Self-pay

## 2013-04-19 ENCOUNTER — Ambulatory Visit (INDEPENDENT_AMBULATORY_CARE_PROVIDER_SITE_OTHER): Payer: Medicare Other | Admitting: Pharmacist

## 2013-04-19 DIAGNOSIS — Z7901 Long term (current) use of anticoagulants: Secondary | ICD-10-CM

## 2013-04-19 DIAGNOSIS — I4891 Unspecified atrial fibrillation: Secondary | ICD-10-CM

## 2013-04-19 LAB — POCT INR: INR: 2.3

## 2013-04-19 MED ORDER — CHLORTHALIDONE 25 MG PO TABS: ORAL_TABLET | ORAL | Status: DC

## 2013-04-19 MED ORDER — LOSARTAN POTASSIUM 50 MG PO TABS: 50.0000 mg | ORAL_TABLET | Freq: Every day | ORAL | Status: DC

## 2013-04-19 MED ORDER — POTASSIUM CHLORIDE CRYS ER 20 MEQ PO TBCR: 20.0000 meq | EXTENDED_RELEASE_TABLET | Freq: Three times a day (TID) | ORAL | Status: DC

## 2013-04-19 MED ORDER — CARVEDILOL 12.5 MG PO TABS: 12.5000 mg | ORAL_TABLET | Freq: Two times a day (BID) | ORAL | Status: DC

## 2013-04-19 MED ORDER — FUROSEMIDE 40 MG PO TABS: 80.0000 mg | ORAL_TABLET | Freq: Two times a day (BID) | ORAL | Status: DC

## 2013-04-19 MED ORDER — COLCHICINE 0.6 MG PO TABS: 0.6000 mg | ORAL_TABLET | ORAL | Status: DC | PRN

## 2013-04-19 NOTE — Progress Notes (Signed)
**Note De-Identified [name] Obfuscation** The pt states that another office removed most all directions for his medications on his medications list. The pts wife asked me to go into pts chart to correct. I corrected these medications based on a list provided by the pt s wife [name].

## 2013-04-19 NOTE — Telephone Encounter (Signed)
**Note De-Identified Alexcia Schools Obfuscation** 1/28 Weight= 241 lbs.  Pts wife states that the pt continues to have swelling in right leg that is unchanged and no SOB at this time.  1/29 Weight= 239 lbs.  Pts wife states that the pt continues to have swelling in right leg that is unchanged and no SOB at this time.  1/30 Weight= 241 lbs. Pts wife states that the pt continues to have swelling in right leg that is unchanged and no SOB at this time.

## 2013-04-22 ENCOUNTER — Other Ambulatory Visit: Payer: Self-pay | Admitting: Cardiology

## 2013-04-23 ENCOUNTER — Other Ambulatory Visit: Payer: Self-pay

## 2013-04-23 MED ORDER — POTASSIUM CHLORIDE CRYS ER 20 MEQ PO TBCR: 20.0000 meq | EXTENDED_RELEASE_TABLET | Freq: Three times a day (TID) | ORAL | Status: DC

## 2013-04-23 MED ORDER — LOSARTAN POTASSIUM 50 MG PO TABS: 50.0000 mg | ORAL_TABLET | Freq: Every day | ORAL | Status: DC

## 2013-04-24 ENCOUNTER — Telehealth: Payer: Self-pay | Admitting: Cardiology

## 2013-04-24 ENCOUNTER — Telehealth: Payer: Self-pay

## 2013-04-24 NOTE — Telephone Encounter (Signed)
New message     Returning Lynn's call.  Pls call after 4pm----they have appts.

## 2013-04-24 NOTE — Telephone Encounter (Signed)
2/2 Weight= 244 lbs.  The pts wife, Joaquim Lai, states that the pt continues to have edema in his right leg and no SOB at this time. The pt took Chlorthalidone 12.5 mg today due to weight greater than 242 lbs. 2/3 Weight= 241 lbs.   The pts wife, Joaquim Lai, states that the pt continues to have edema in his right leg and no SOB at this time. 2/4 Weight= 244 lbs.   The pts wife, Joaquim Lai, states that the pt continues to have edema in his right leg and no SOB at this time. The pt took Chlorthalidone 12.5 mg today due to weight greater than 242 lbs.

## 2013-04-26 ENCOUNTER — Encounter (INDEPENDENT_AMBULATORY_CARE_PROVIDER_SITE_OTHER): Payer: Self-pay | Admitting: Surgery

## 2013-04-26 ENCOUNTER — Ambulatory Visit (INDEPENDENT_AMBULATORY_CARE_PROVIDER_SITE_OTHER): Payer: Medicare Other | Admitting: Surgery

## 2013-04-26 ENCOUNTER — Encounter (INDEPENDENT_AMBULATORY_CARE_PROVIDER_SITE_OTHER): Payer: Medicare Other | Admitting: Surgery

## 2013-04-26 VITALS — BP 122/80 | HR 68 | Temp 98.2°F | Resp 14 | Ht 72.0 in | Wt 246.6 lb

## 2013-04-26 DIAGNOSIS — N498 Inflammatory disorders of other specified male genital organs: Secondary | ICD-10-CM

## 2013-04-26 DIAGNOSIS — N492 Inflammatory disorders of scrotum: Secondary | ICD-10-CM

## 2013-04-26 NOTE — Progress Notes (Signed)
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 This was drained by general surgery and seemed to heal but recently he has developed some more swelling and spontaneously ruptured the abscess. He continues to have some chronic drainage from a pinpoint opening at the end of his previous surgical incision. He denies any significant pain. He comes in today for followup.  Filed Vitals:   04/26/13 0943  BP: 122/80  Pulse: 68  Temp: 98.2 F (36.8 C)  Resp: 14     His scrotum does not show any cellulitis or induration. The incision is closed except for the most posterior and. There is a small pinpoint opening with some underlying firmness. When I palpate this area a small amount of purulent fluid was expressed.  The patient may need to have this entire area excised from the scrotum. Since it is not actively infected and does not require emergent drainage, I would prefer to send him to urology for evaluation for formal resection. We will arrange this for him.  He may followup with [place] as needed.  Imogene Burn. Georgette Dover, MD, [facility] Surgery  General/ Trauma Surgery  04/26/2013 10:22 AM

## 2013-04-30 NOTE — Telephone Encounter (Signed)
**Note De-Identified Enisa Runyan Obfuscation** CHF pt. The pts wife called to report the pts weight. Noted.

## 2013-05-03 NOTE — Telephone Encounter (Signed)
I have been unable to contact the pt all week. Will call next week.

## 2013-05-10 NOTE — Telephone Encounter (Signed)
2/17  Weight=  241 lbs   The pts wife, Colston, states that the pt continues to have edema that is unchanged in his right leg and no SOB at this time. 2/18  Weight= 241 lbs     The pts wife, Krist, states that the pt continues to have edema that is unchanged in his right leg and no SOB at this time. 2/19  Weight= 241 lbs     The pts wife, Jeziah, states that the pt continues to have edema that is unchanged in his right leg and no SOB at this time. 2/20  Weight= 241 lbs.    The pts wife, Nayquan, states that the pt continues to have edema that is unchanged in his right leg and no SOB at this time.

## 2013-05-23 ENCOUNTER — Telehealth: Payer: Self-pay | Admitting: Cardiology

## 2013-05-23 NOTE — Telephone Encounter (Signed)
New message ° ° ° ° ° °Returning a nurses call from yesterday °

## 2013-05-24 NOTE — Telephone Encounter (Signed)
The pts wife, Jancarlos, called in pts weight of 241 lbs. He is a CHF pt followed by [name].

## 2013-05-24 NOTE — Telephone Encounter (Signed)
LMTCB

## 2013-05-29 NOTE — Telephone Encounter (Signed)
2/23  Weight= 242 lbs.  The pt states that he continues to have right leg edem that has not changed and no sob at this time. 3/2    Weight= 247 lbs.  The pt took chlorthalidone dose due to weight greater than 242 lbs. No changes in the pts edema or sob at this time. 3/3  Weight= 241 lbs.   No changes in the pts edema or sob at this time.No changes in the pts edema or sob at this time. 3/4  Weight= 239 lbs.  No changes in the pts edema or sob at this time. 3/5   Weight= 241 lbs.  No changes in the pts edema or sob at this time. 3/6  Weight= 241 lbs.  No changes in the pts edema or sob at this time. 3/9  Weight= 244 lbs.  The pt took chlorthalidone dose due to weight greater than 242 lbs. No changes in the pts edema or sob at this time. 3/10 Weight=  241 lbs.  The pt reports that he has mild SOB and continues to have right leg edema.

## 2013-05-30 ENCOUNTER — Telehealth: Payer: Self-pay | Admitting: *Deleted

## 2013-05-30 NOTE — Telephone Encounter (Signed)
Form mailed to patient.

## 2013-06-04 NOTE — Telephone Encounter (Signed)
**Note De-Identified Merri Dimaano Obfuscation** 06/03/13  Weight=  244 lbs.  The pts wife, Rayshon, states that the pt continues to have right leg edema and no SOB at this time. The pt took a Chlorthalidone dose today due to weight greater than 242 lbs. 06/04/13  Weight=  242 lbs.  Javed reports no changes in the pts edema or SOB at this time.

## 2013-06-05 ENCOUNTER — Ambulatory Visit (INDEPENDENT_AMBULATORY_CARE_PROVIDER_SITE_OTHER): Payer: Medicare Other | Admitting: *Deleted

## 2013-06-05 ENCOUNTER — Encounter: Payer: Self-pay | Admitting: Cardiology

## 2013-06-05 ENCOUNTER — Encounter: Payer: Self-pay | Admitting: Internal Medicine

## 2013-06-05 ENCOUNTER — Ambulatory Visit (INDEPENDENT_AMBULATORY_CARE_PROVIDER_SITE_OTHER): Payer: Medicare Other | Admitting: Cardiology

## 2013-06-05 ENCOUNTER — Other Ambulatory Visit: Payer: Self-pay

## 2013-06-05 VITALS — BP 118/75 | HR 58 | Ht 72.0 in | Wt 249.0 lb

## 2013-06-05 DIAGNOSIS — Z951 Presence of aortocoronary bypass graft: Secondary | ICD-10-CM

## 2013-06-05 DIAGNOSIS — I509 Heart failure, unspecified: Secondary | ICD-10-CM

## 2013-06-05 DIAGNOSIS — I714 Abdominal aortic aneurysm, without rupture, unspecified: Secondary | ICD-10-CM

## 2013-06-05 DIAGNOSIS — Z8679 Personal history of other diseases of the circulatory system: Secondary | ICD-10-CM

## 2013-06-05 DIAGNOSIS — Z9889 Other specified postprocedural states: Principal | ICD-10-CM

## 2013-06-05 DIAGNOSIS — I4892 Unspecified atrial flutter: Secondary | ICD-10-CM

## 2013-06-05 DIAGNOSIS — I429 Cardiomyopathy, unspecified: Secondary | ICD-10-CM

## 2013-06-05 DIAGNOSIS — Z7901 Long term (current) use of anticoagulants: Secondary | ICD-10-CM

## 2013-06-05 DIAGNOSIS — I428 Other cardiomyopathies: Secondary | ICD-10-CM

## 2013-06-05 DIAGNOSIS — Z95810 Presence of automatic (implantable) cardiac defibrillator: Secondary | ICD-10-CM

## 2013-06-05 DIAGNOSIS — I4891 Unspecified atrial fibrillation: Secondary | ICD-10-CM

## 2013-06-05 DIAGNOSIS — I4589 Other specified conduction disorders: Secondary | ICD-10-CM

## 2013-06-05 DIAGNOSIS — I2510 Atherosclerotic heart disease of native coronary artery without angina pectoris: Secondary | ICD-10-CM

## 2013-06-05 LAB — MDC_IDC_ENUM_SESS_TYPE_INCLINIC
Battery Remaining Longevity: 124 mo
Brady Statistic AP VP Percent: 0 %
Brady Statistic AS VP Percent: 15.09 %
Brady Statistic AS VS Percent: 84.91 %
Brady Statistic RA Percent Paced: 0 %
Brady Statistic RV Percent Paced: 19.21 %
Date Time Interrogation Session: 20150318144829
HighPow Impedance: 171 Ohm
HighPow Impedance: 69 Ohm
Lead Channel Impedance Value: 4047 Ohm
Lead Channel Impedance Value: 4047 Ohm
Lead Channel Impedance Value: 456 Ohm
Lead Channel Impedance Value: 456 Ohm
Lead Channel Pacing Threshold Pulse Width: 0.4 ms
Lead Channel Sensing Intrinsic Amplitude: 9.875 mV
Lead Channel Setting Pacing Amplitude: 2.5 V
MDC IDC MSMT BATTERY VOLTAGE: 3.06 V
MDC IDC MSMT LEADCHNL LV IMPEDANCE VALUE: 532 Ohm
MDC IDC MSMT LEADCHNL RA SENSING INTR AMPL: 0.375 mV
MDC IDC MSMT LEADCHNL RA SENSING INTR AMPL: 0.75 mV
MDC IDC MSMT LEADCHNL RV PACING THRESHOLD AMPLITUDE: 0.75 V
MDC IDC MSMT LEADCHNL RV SENSING INTR AMPL: 12.125 mV
MDC IDC SET LEADCHNL RV PACING PULSEWIDTH: 0.4 ms
MDC IDC SET LEADCHNL RV SENSING SENSITIVITY: 0.3 mV
MDC IDC SET ZONE DETECTION INTERVAL: 340 ms
MDC IDC STAT BRADY AP VS PERCENT: 0 %
Zone Setting Detection Interval: 300 ms
Zone Setting Detection Interval: 350 ms
Zone Setting Detection Interval: 450 ms

## 2013-06-05 LAB — POCT INR: INR: 1.5

## 2013-06-05 MED ORDER — CARVEDILOL 6.25 MG PO TABS
6.2500 mg | ORAL_TABLET | Freq: Two times a day (BID) | ORAL | Status: DC
Start: 2013-06-05 — End: 2013-06-05

## 2013-06-05 MED ORDER — CARVEDILOL 6.25 MG PO TABS: 6.2500 mg | ORAL_TABLET | Freq: Two times a day (BID) | ORAL | Status: DC

## 2013-06-05 NOTE — Assessment & Plan Note (Signed)
The patient will have his ICD interrogated further today. I will wait to hear from Dr. Lovena Le concerning what changes might be made. There are questions concerning what grade he should be at. There is also question concerning whether his left ventricular lead should be turned back on.

## 2013-06-05 NOTE — Addendum Note (Signed)
**Note De-Identified Taurus Willis Obfuscation** Addended by: Dennie Fetters on: 06/05/2013 02:34 PM   Modules accepted: Orders, Medications

## 2013-06-05 NOTE — Assessment & Plan Note (Signed)
His coronary disease is stable. He has not had a nuclear scan since before his catheterization in 2008. I will consider this over time.

## 2013-06-05 NOTE — Progress Notes (Signed)
HPI  Patient is seen today for her overall cardiology followup. He is also scheduled to be seen to have his ICD interrogated after my visit. Before today's visit I went back and reviewed the patient's entire record in great detail. His abdominal aortic aneurysm repair was in 2004. I do not think that he has had any type of repeat ultrasound since then. This will be considered. His CABG was in 2004. His last cath was 2008. His grafts are patent. He has not had a nuclear scan since then.  Most recently he had a generator replaced in November, 2014. He had early followup visit with Dr. Lovena Le. Adjustments were made in his pacing. It is my understanding that his left ventricular lead was not turned on at the time of the new generator to see if battery power could be spared. After his procedure he returned as an outpatient. Decision was made to turn his pacing rate down in hopes that he would feel better with his own rhythm. When I saw him on March 04, 2013 he was feeling better.  Today he tells me that he is having some problems with fatigue. His weight has remained relatively stable on his scale at 241. He takes extra diuretics if his weight goes to 242.  Allergies  Allergen Reactions  . Avelox [Moxifloxacin Hcl In Nacl] Swelling, Rash and Other (See Comments)    Patient became hypotensive after infusion started Because of a history of documented adverse serious drug reaction;Medi Alert bracelet  is recommended  . Penicillins     REACTION: anaphylaxis Because of a history of documented adverse serious drug reaction;Medi Alert bracelet  is recommended    Current Outpatient Prescriptions  Medication Sig Dispense Refill  . ACCU-CHEK FASTCLIX LANCETS MISC Use to check blood sugars 3 times per day  100 each  5  . allopurinol (ZYLOPRIM) 100 MG tablet Take 1 tablet (100 mg total) by mouth daily.  90 tablet  1  . amLODipine (NORVASC) 5 MG tablet TAKE ONE TABLET BY MOUTH TWICE DAILY  180 tablet  0    . aspirin 81 MG tablet Take 81 mg by mouth daily.      Marland Kitchen atorvastatin (LIPITOR) 10 MG tablet Take 1 tablet (10 mg total) by mouth daily.  90 tablet  0  . carvedilol (COREG) 12.5 MG tablet Take 1 tablet (12.5 mg total) by mouth 2 (two) times daily with a meal.  60 tablet  0  . chlorthalidone (HYGROTON) 25 MG tablet Take when weight reaches 242 lbs. Or greater only  30 tablet  0  . Cholecalciferol 10000 UNITS CAPS Take 1,000 Units by mouth daily.      . colchicine (COLCRYS) 0.6 MG tablet Take 1 tablet (0.6 mg total) by mouth as needed (for gout).  30 tablet  0  . furosemide (LASIX) 40 MG tablet Take 2 tablets (80 mg total) by mouth 2 (two) times daily.  120 tablet  0  . gabapentin (NEURONTIN) 100 MG capsule       . glucose blood (ACCU-CHEK SMARTVIEW) test strip Use as instructed to check blood sugars 3 times per day dx code 250.72  100 each  5  . ipratropium (ATROVENT) 0.06 % nasal spray       . LEVEMIR FLEXPEN 100 UNIT/ML SOPN Inject 30 Units into the skin at bedtime.       Marland Kitchen losartan (COZAAR) 50 MG tablet Take 1 tablet (50 mg total) by mouth daily.  90 tablet  1  . naphazoline-pheniramine (NAPHCON-A) 0.025-0.3 % ophthalmic solution Place 1 drop into both eyes daily as needed for irritation.      . nitroGLYCERIN (NITROSTAT) 0.4 MG SL tablet Place 0.4 mg under the tongue every 5 (five) minutes as needed for chest pain.      Marland Kitchen NOVOLOG FLEXPEN 100 UNIT/ML SOPN FlexPen Inject 8 Units into the skin 2 (two) times daily.       . potassium chloride SA (KLOR-CON M20) 20 MEQ tablet Take 1 tablet (20 mEq total) by mouth 3 (three) times daily.  270 tablet  1  . warfarin (COUMADIN) 2.5 MG tablet 5mg  qd exp wed.      . warfarin (COUMADIN) 2.5 MG tablet TAKE AS DIRECTED BY ANTICOAGULATION CLINIC  120 tablet  0   No current facility-administered medications for this visit.    History   Social History  . Marital Status: Married    Spouse Name: N/A    Number of Children: N/A  . Years of Education: N/A    Occupational History  . Not on file.   Social History Main Topics  . Smoking status: Former Smoker    Quit date: 03/22/1995  . Smokeless tobacco: Former Systems developer  . Alcohol Use: 1.2 oz/week    2 Cans of beer per week     Comment: beer  . Drug Use: No  . Sexual Activity: No   Other Topics Concern  . Not on file   Social History Narrative  . No narrative on file    Family History  Problem Relation Age of Onset  . Hypertension Mother   . Stroke Mother   . Diabetes Father   . Coronary artery disease Father   . Other Father     DVT    Past Medical History  Diagnosis Date  . Diabetes mellitus   . Hyperlipidemia   . Hypertension   . Pilonidal cyst   . Atrial fibrillation     Previous long-term amiodarone therapy with multiple cardioversions / amiodarone stopped September, 2009  . Atrial flutter     Started November, 2010, Left-sided and cannot ablate  . Left atrial thrombus     Remote past... cardioversions done since that time  . Wide-complex tachycardia   . Left ventricular ejection fraction less than 40%   . Gout   . AAA (abdominal aortic aneurysm)     Surgical repair  . Discolored skin   . S/P ICD (internal cardiac defibrillator) procedure     Dr. Lovena Le 2009... by the pacing  . SOB (shortness of breath)     Large left effusion/ thoracentesis/hospitalization/November, 2011... exudated.. cytology negative.. Dr.Wert.. no proof of mesothelioma  . Pericardial effusion   . Pleural effusion     Large loculated effusion on the left side November, 2011. This was tapped. It was exudative. Cytology revealed no cancer no proof of mesothelioma area pulmonary team felt that no further workup was needed  . S/P AAA repair   . Spinal stenosis     Surgery Dr.Elsner  . CAD (coronary artery disease)     Catheterization July, 2008... name and vein grafts patent but low cardiac output  . Warfarin anticoagulation   . Cardiomyopathy     Ischemic... ICD  . Combined systolic and  diastolic CHF   . Venous insufficiency     Toe discoloration chronic  . Mitral regurgitation     Mild echo  . Aortic valve sclerosis   . Nasal drainage  Chronic  . Alcohol ingestion of more than four drinks per week     Excess beer  not a dependency problem  . Chronotropic incompetence     IV pacing rate adjusted  . Thrombophlebitis of superficial veins of upper extremities     Possible venous stenosis from defibrillator  . Pericardial effusion     November, 2011 .. decreased during hospitalization  . Eye abnormality     Ophthalmologist questions a clot in one of his eyes, May, 2012  . Overweight     November, 2012  . Pleural thickening   . Ejection fraction     Ejection fraction has varied over time from 35-50%.,, Echoes are technically very difficult,,, EF 50%, echo, May 25, 2011, technically very difficult  . Drug therapy     Redness and swelling with Avelox infusion May 24, 2011  . COPD (chronic obstructive pulmonary disease)   . Myocardial infarction   . Spinal cord stimulator status     October, 2013  . Carotid artery disease     Doppler, December, 2013, 0-39% bilateral  . Pneumonia   . Arthritis   . Ventral hernia     April, 2014, result of his abdominal surgery  . Bony abnormality     Patient's manubrium is slightly displaced to the right    Past Surgical History  Procedure Laterality Date  . Colonoscopy w/ polypectomy    . Lumbar fusion    . Pilonidal cyst removal    . Surgery scrotal / testicular    . Coronary artery bypass graft  2004  . Abdominal aortic aneurysm repair  11/2002  . Bi-ventricular pacemaker insertion (crt-p)  02-11-2013    Pt with previously implanted MDT CRTD downgraded to CRTP by Dr Lovena Le 02-11-13  . Back surgery    . Incision and drainage abscess / hematoma of bursa / knee / thigh      Patient Active Problem List   Diagnosis Date Noted  . Abscess, scrotum 03/05/2013  . Combined systolic and diastolic CHF   . Ejection fraction    . Hypopotassemia 12/20/2012  . ACE-inhibitor cough 10/04/2012  . Ventral hernia   . Bony abnormality   . Cellulitis of scrotum 05/23/2012  . Carotid artery disease   . Spinal cord stimulator status   . 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 Atrial fibrillation   . Spinal stenosis   . CAD (coronary artery disease)   . Cardiomyopathy   . Venous insufficiency   . Mitral regurgitation   . Aortic valve sclerosis   . Alcohol ingestion of more than four drinks per week   . Chronotropic incompetence   . Thrombophlebitis of superficial veins of upper extremities   . Pericardial effusion   . Overweight   . Pleural thickening   . Pleural effusion   . Drug therapy   . Hyperlipidemia   . Hypertension   . Atrial flutter   . Left atrial thrombus   . AAA (abdominal aortic aneurysm)   . S/P ICD (internal cardiac defibrillator) procedure   . Warfarin anticoagulation   . Polyneuropathy in diabetes(357.2) 01/07/2010  . DIVERTICULOSIS, COLON 06/10/2009  . Uncontrolled diabetes mellitus with peripheral circulatory disorder 03/11/2009  . CKD (chronic kidney disease), stage III 06/19/2008  . BACK PAIN, LUMBAR 06/12/2008  . 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 GOUT 07/27/2006  . DVT 07/27/2006  . CORONARY ARTERY BYPASS GRAFT, HX OF 07/27/2006    ROS   Patient denies  fever, chills, headache, sweats, rash, change in vision, change in hearing, chest pain, cough, nausea vomiting, urinary symptoms. All other systems are reviewed and are negative.  PHYSICAL EXAM  Patient is here with his wife. He is oriented to person time and place. Affect is normal. There is no jugulovenous distention. Lungs reveal decreased breath sounds at his left base. His breath sounds are distant in general. Cardiac exam reveals S1 and S2. The abdomen is protuberant. He has no significant peripheral edema.  Filed Vitals:   06/05/13 1348  Height: 6' (1.829 m)  Weight: 249 lb  (112.946 kg)     ASSESSMENT & PLAN

## 2013-06-05 NOTE — Patient Instructions (Addendum)
Your physician has requested that you have an abdominal aorta duplex. During this test, an ultrasound is used to evaluate the aorta. Allow 30 minutes for this exam. Do not eat after midnight the day before and avoid carbonated beverages  Your physician recommends that you schedule a follow-up appointment in: 3 months

## 2013-06-05 NOTE — Assessment & Plan Note (Signed)
His volume status is stable currently watching his weight very carefully. We will continue to use diuretics and adjust them if his weight goes up.

## 2013-06-05 NOTE — Assessment & Plan Note (Signed)
His abdominal aortic aneurysm repair was done 2004. He has not had a followup ultrasound since then. This will be arranged.

## 2013-06-06 ENCOUNTER — Telehealth: Payer: Self-pay | Admitting: Internal Medicine

## 2013-06-06 NOTE — Telephone Encounter (Signed)
New message     Want device clinic to know a transmission was sent today

## 2013-06-06 NOTE — Telephone Encounter (Signed)
Left message for patient that transmission was received.

## 2013-06-07 NOTE — Progress Notes (Signed)
CRT-D check in clinic. Normal device function. Threshold and sensing consistent with previous device measurements. Impedance trends stable over time. No evidence of any ventricular arrhythmias. Permanent AF + Warfarin. Histogram distribution appropriate for patient and level of activity. No changes made this session. Patient's Coreg was decreased to 6.25 BID by GT due to fatigue. Device programmed at appropriate safety margins. Device programmed to optimize intrinsic conduction. Estimated longevity 10.5 years. Pt enrolled in remote follow-up. Plan to follow up via [facility] on 6-22 and with GT in December 2015. Alert tones demonstrated for patient.

## 2013-06-11 ENCOUNTER — Telehealth: Payer: Self-pay | Admitting: Cardiology

## 2013-06-11 NOTE — Telephone Encounter (Signed)
**Note De-identified Joanann Mies Obfuscation** LMTCB

## 2013-06-11 NOTE — Telephone Encounter (Signed)
New message ° ° ° ° ° °Returning a nurses call °

## 2013-06-12 ENCOUNTER — Ambulatory Visit (HOSPITAL_COMMUNITY): Payer: Medicare Other | Attending: Cardiology | Admitting: *Deleted

## 2013-06-12 ENCOUNTER — Encounter: Payer: Self-pay | Admitting: Cardiology

## 2013-06-12 DIAGNOSIS — Z8679 Personal history of other diseases of the circulatory system: Secondary | ICD-10-CM

## 2013-06-12 DIAGNOSIS — Z48812 Encounter for surgical aftercare following surgery on the circulatory system: Secondary | ICD-10-CM | POA: Insufficient documentation

## 2013-06-12 DIAGNOSIS — I701 Atherosclerosis of renal artery: Secondary | ICD-10-CM

## 2013-06-12 DIAGNOSIS — Z9889 Other specified postprocedural states: Secondary | ICD-10-CM

## 2013-06-12 NOTE — Telephone Encounter (Signed)
The pts wife, Jemari, called to report the pts weight of 241 lbs.

## 2013-06-12 NOTE — Progress Notes (Signed)
Abdominal aorta duplex complete TLF

## 2013-06-14 ENCOUNTER — Encounter: Payer: Self-pay | Admitting: Cardiology

## 2013-07-02 ENCOUNTER — Other Ambulatory Visit (INDEPENDENT_AMBULATORY_CARE_PROVIDER_SITE_OTHER): Payer: Medicare Other

## 2013-07-02 DIAGNOSIS — E1165 Type 2 diabetes mellitus with hyperglycemia: Principal | ICD-10-CM

## 2013-07-02 DIAGNOSIS — IMO0002 Reserved for concepts with insufficient information to code with codable children: Secondary | ICD-10-CM

## 2013-07-02 DIAGNOSIS — E1159 Type 2 diabetes mellitus with other circulatory complications: Secondary | ICD-10-CM

## 2013-07-02 DIAGNOSIS — E1151 Type 2 diabetes mellitus with diabetic peripheral angiopathy without gangrene: Secondary | ICD-10-CM

## 2013-07-02 DIAGNOSIS — I798 Other disorders of arteries, arterioles and capillaries in diseases classified elsewhere: Secondary | ICD-10-CM

## 2013-07-02 LAB — BASIC METABOLIC PANEL
BUN: 34 mg/dL — ABNORMAL HIGH (ref 6–23)
CHLORIDE: 98 meq/L (ref 96–112)
CO2: 28 meq/L (ref 19–32)
Calcium: 9.4 mg/dL (ref 8.4–10.5)
Creatinine, Ser: 1.5 mg/dL (ref 0.4–1.5)
GFR: 48.49 mL/min — ABNORMAL LOW (ref >60.00)
GLUCOSE: 182 mg/dL — AB (ref 70–99)
POTASSIUM: 3.3 meq/L — AB (ref 3.5–5.1)
SODIUM: 139 meq/L (ref 135–145)

## 2013-07-02 LAB — LIPID PANEL
CHOL/HDL RATIO: 4
Cholesterol: 173 mg/dL (ref 0–200)
HDL: 45 mg/dL (ref >39.00)
LDL CALC: 96 mg/dL (ref 0–99)
TRIGLYCERIDES: 162 mg/dL — AB (ref 0.0–149.0)
VLDL: 32.4 mg/dL (ref 0.0–40.0)

## 2013-07-02 LAB — HEMOGLOBIN A1C: HEMOGLOBIN A1C: 7.7 % — AB (ref 4.6–6.5)

## 2013-07-05 ENCOUNTER — Telehealth: Payer: Self-pay

## 2013-07-05 ENCOUNTER — Ambulatory Visit (INDEPENDENT_AMBULATORY_CARE_PROVIDER_SITE_OTHER): Payer: Medicare Other | Admitting: Endocrinology

## 2013-07-05 ENCOUNTER — Encounter: Payer: Self-pay | Admitting: Endocrinology

## 2013-07-05 ENCOUNTER — Other Ambulatory Visit: Payer: Self-pay | Admitting: *Deleted

## 2013-07-05 VITALS — BP 108/64 | HR 65 | Temp 98.3°F | Resp 16 | Ht 72.0 in | Wt 252.6 lb

## 2013-07-05 DIAGNOSIS — IMO0002 Reserved for concepts with insufficient information to code with codable children: Secondary | ICD-10-CM

## 2013-07-05 DIAGNOSIS — E1151 Type 2 diabetes mellitus with diabetic peripheral angiopathy without gangrene: Secondary | ICD-10-CM

## 2013-07-05 DIAGNOSIS — I798 Other disorders of arteries, arterioles and capillaries in diseases classified elsewhere: Secondary | ICD-10-CM

## 2013-07-05 DIAGNOSIS — I10 Essential (primary) hypertension: Secondary | ICD-10-CM

## 2013-07-05 DIAGNOSIS — E1159 Type 2 diabetes mellitus with other circulatory complications: Secondary | ICD-10-CM

## 2013-07-05 DIAGNOSIS — E785 Hyperlipidemia, unspecified: Secondary | ICD-10-CM

## 2013-07-05 DIAGNOSIS — E1165 Type 2 diabetes mellitus with hyperglycemia: Principal | ICD-10-CM

## 2013-07-05 MED ORDER — METFORMIN HCL ER 750 MG PO TB24: 750.0000 mg | ORAL_TABLET | Freq: Every day | ORAL | Status: DC

## 2013-07-05 NOTE — Patient Instructions (Addendum)
33 Levemir at bedtime and go up or down 2 units weekly if sugar <90 or over 140 NovoLog 10-12  units before meals  Amlodipine 1/2 tab daily  Metformin ER 750mg  daily  Please check blood sugars at least once daily about 2 hours after any meal and every 2 days on waking up.  Please bring blood sugar monitor to each visit

## 2013-07-05 NOTE — Telephone Encounter (Signed)
The pts wife, Tykeem, states that Dr Dwyane Dee (endocrinologist) decreased the pts Amlodipine from 5 mg BID to 2.5 mg daily as the pts BP was low at OV with him today. Makel wants to know if [name] agrees. Please advise.

## 2013-07-05 NOTE — Progress Notes (Signed)
Patient ID: [name], male   DOB: 10/24/1940, 73 y.o.   MRN: [id number]    Reason for Appointment : F/u for Type 2 Diabetes  History of Present Illness          Diagnosis: Type 2 diabetes mellitus, date of diagnosis: 2008       Past history: He was initially treated with Metformin and also given Amayl at some point. With this he had fair control of his diabetes with A1c had usually been over 7% except once in 2013.  He was taken off metformin probably in March this year after a hospitalization, possibly because of renal dysfunction He was started on Levemir insulin in May when his blood sugars were significantly higher, fasting readings averaging 220.  Levemir progressively increased and was on 35 units at night since 12/20/12 He has not been on any other medications either orally or injectable for his diabetes  Recent history:    Because of his A1c of over 10% in 10/14 on basal insulin alone he was started on NovoLog with each meal in 10/14 He  increased his NovoLog from a starting dose of 6 units to 12 units initially and postprandial readings had improved significantly However with some improvement in diet he was able to take as little as 8 units on his last visit in 1/15 Since the last visit his readings are overall higher and he is taking 10 units of NovoLog Most of his high readings now are related to eating out Not clear why his blood sugars are overall higher and his A1c is going up a little Levemir dosage was  not changed from the last visit since fasting readings are excellent Did not bring his monitor for download today  INSULIN regimen is described as:  30 Levemir hs NovoLog 10 units ac  Glucose monitoring:  done 3 times a day        Glucometer:  FreeStyle.     Blood Glucose readings from his diary: FASTING recently: Mostly 130-150, nonfasting 140-230 Hypoglycemia frequency:  none.           Self-care: The diet that the patient has been following is: Smaller  portions and relatively balanced meals  Meals: 2 meals per day. Noon and 6 pm  Low fat, trying to eat more salads. Avoiding drinks with sugar and no juices       Physical activity: exercise: Minimal, limited by back pain           Dietician visit: Most recent: Unknown.  CDE visit: 11/14               Compliance with the medical regimen: Good  Retinal exam: Most recent: 6/14, reportedly normal  Weight control: This appears to be fluctuating significantly  Wt Readings from Last 3 Encounters:  07/05/13 252 lb 9.6 oz (114.579 kg)  06/05/13 249 lb (112.946 kg)  04/26/13 246 lb 9.6 oz (111.857 kg)    Lab Results  Component Value Date   HGBA1C 7.7* 07/02/2013   HGBA1C 7.3* 03/28/2013   HGBA1C 10.2* 12/19/2012   Lab Results  Component Value Date   MICROALBUR 0.5 02/01/2013   LDLCALC 96 07/02/2013   CREATININE 1.5 07/02/2013       Medication List       This list is accurate as of: 07/05/13  1:42 PM.  Always use your most recent med list.               ACCU-CHEK FASTCLIX LANCETS Misc  Use to check blood sugars 3 times per day     allopurinol 100 MG tablet  Commonly known as:  ZYLOPRIM  Take 1 tablet (100 mg total) by mouth daily.     amLODipine 5 MG tablet  Commonly known as:  NORVASC  TAKE ONE TABLET BY MOUTH TWICE DAILY     aspirin 81 MG tablet  Take 81 mg by mouth daily.     atorvastatin 10 MG tablet  Commonly known as:  LIPITOR  Take 1 tablet (10 mg total) by mouth daily.     carvedilol 6.25 MG tablet  Commonly known as:  COREG  Take 1 tablet (6.25 mg total) by mouth 2 (two) times daily.     chlorthalidone 25 MG tablet  Commonly known as:  HYGROTON  Take when weight reaches 242 lbs. Or greater only     Cholecalciferol 10000 UNITS Caps  Take 1,000 Units by mouth daily.     colchicine 0.6 MG tablet  Commonly known as:  COLCRYS  Take 1 tablet (0.6 mg total) by mouth as needed (for gout).     furosemide 40 MG tablet  Commonly known as:  LASIX  Take 2 tablets  (80 mg total) by mouth 2 (two) times daily.     gabapentin 100 MG capsule  Commonly known as:  NEURONTIN     glucose blood test strip  Commonly known as:  ACCU-CHEK SMARTVIEW  Use as instructed to check blood sugars 3 times per day dx code 250.72     ipratropium 0.06 % nasal spray  Commonly known as:  ATROVENT     LEVEMIR FLEXPEN 100 UNIT/ML Pen  Generic drug:  Insulin Detemir  Inject 30 Units into the skin at bedtime.     losartan 50 MG tablet  Commonly known as:  COZAAR  Take 1 tablet (50 mg total) by mouth daily.     naphazoline-pheniramine 0.025-0.3 % ophthalmic solution  Commonly known as:  NAPHCON-A  Place 1 drop into both eyes daily as needed for irritation.     nitroGLYCERIN 0.4 MG SL tablet  Commonly known as:  NITROSTAT  Place 0.4 mg under the tongue every 5 (five) minutes as needed for chest pain.     NOVOLOG FLEXPEN 100 UNIT/ML FlexPen  Generic drug:  insulin aspart  Inject 8 Units into the skin 2 (two) times daily.     potassium chloride SA 20 MEQ tablet  Commonly known as:  KLOR-CON M20  Take 1 tablet (20 mEq total) by mouth 3 (three) times daily.     warfarin 2.5 MG tablet  Commonly known as:  COUMADIN  5mg  qd exp wed.     warfarin 2.5 MG tablet  Commonly known as:  COUMADIN  TAKE AS DIRECTED BY ANTICOAGULATION CLINIC        Allergies:  Allergies  Allergen Reactions  . Avelox [Moxifloxacin Hcl In Nacl] Swelling, Rash and Other (See Comments)    Patient became hypotensive after infusion started Because of a history of documented adverse serious drug reaction;Medi Alert bracelet  is recommended  . Penicillins     REACTION: anaphylaxis Because of a history of documented adverse serious drug reaction;Medi Alert bracelet  is recommended    Past Medical History  Diagnosis Date  . Diabetes mellitus   . Hyperlipidemia   . Hypertension   . Pilonidal cyst   . Atrial fibrillation     Previous long-term amiodarone therapy with multiple  cardioversions / amiodarone stopped September, 2009  .  Atrial flutter     Started November, 2010, Left-sided and cannot ablate  . Left atrial thrombus     Remote past... cardioversions done since that time  . Wide-complex tachycardia   . Left ventricular ejection fraction less than 40%   . Gout   . AAA (abdominal aortic aneurysm)     Surgical repair  . Discolored skin   . S/P ICD (internal cardiac defibrillator) procedure     Dr. Lovena Le 2009... by the pacing  . SOB (shortness of breath)     Large left effusion/ thoracentesis/hospitalization/November, 2011... exudated.. cytology negative.. Dr.Wert.. no proof of mesothelioma  . Pericardial effusion   . Pleural effusion     Large loculated effusion on the left side November, 2011. This was tapped. It was exudative. Cytology revealed no cancer no proof of mesothelioma area pulmonary team felt that no further workup was needed  . S/P AAA repair   . Spinal stenosis     Surgery Dr.Elsner  . CAD (coronary artery disease)     Catheterization July, 2008... name and vein grafts patent but low cardiac output  . Warfarin anticoagulation   . Cardiomyopathy     Ischemic... ICD  . Combined systolic and diastolic CHF   . Venous insufficiency     Toe discoloration chronic  . Mitral regurgitation     Mild echo  . Aortic valve sclerosis   . Nasal drainage     Chronic  . Alcohol ingestion of more than four drinks per week     Excess beer  not a dependency problem  . Chronotropic incompetence     IV pacing rate adjusted  . Thrombophlebitis of superficial veins of upper extremities     Possible venous stenosis from defibrillator  . Pericardial effusion     November, 2011 .. decreased during hospitalization  . Eye abnormality     Ophthalmologist questions a clot in one of his eyes, May, 2012  . Overweight     November, 2012  . Pleural thickening   . Ejection fraction     Ejection fraction has varied over time from 35-50%.,, Echoes are  technically very difficult,,, EF 50%, echo, May 25, 2011, technically very difficult  . Drug therapy     Redness and swelling with Avelox infusion May 24, 2011  . COPD (chronic obstructive pulmonary disease)   . Myocardial infarction   . Spinal cord stimulator status     October, 2013  . Carotid artery disease     Doppler, December, 2013, 0-39% bilateral  . Pneumonia   . Arthritis   . Ventral hernia     April, 2014, result of his abdominal surgery  . Bony abnormality     Patient's manubrium is slightly displaced to the right    Past Surgical History  Procedure Laterality Date  . Colonoscopy w/ polypectomy    . Lumbar fusion    . Pilonidal cyst removal    . Surgery scrotal / testicular    . Coronary artery bypass graft  2004  . Abdominal aortic aneurysm repair  11/2002  . Bi-ventricular pacemaker insertion (crt-p)  02-11-2013    Pt with previously implanted MDT CRTD downgraded to CRTP by Dr Lovena Le 02-11-13  . Back surgery    . Incision and drainage abscess / hematoma of bursa / knee / thigh      Family History  Problem Relation Age of Onset  . Hypertension Mother   . Stroke Mother   . Diabetes Father   .  Coronary artery disease Father   . Other Father     DVT    Social History:  reports that he quit smoking about 18 years ago. He has quit using smokeless tobacco. He reports that he drinks about 1.2 ounces of alcohol per week. He reports that he does not use illicit drugs.    Review of Systems       Lipids: Treated with Lipitor, LDL controlled Lab Results  Component Value Date   LDLCALC 96 07/02/2013                  The blood pressure has been under control with current regimen of chlorthalidone, amlodipine and losartan, BP at home 110-120      He has had pains and tingling in his feet and lower legs. Has known neuropathy  HYPOKALEMIA: He is already on 20 mg twice a day of potassium but is also on furosemide on and off and chlorthalidone. No muscle  cramps   LABS:  Appointment on 07/02/2013  Component Date Value Ref Range Status  . Hemoglobin A1C 07/02/2013 7.7* 4.6 - 6.5 % Final   Glycemic Control Guidelines for People with Diabetes:Non Diabetic:  <6%Goal of Therapy: <7%Additional Action Suggested:  >8%   . Sodium 07/02/2013 139  135 - 145 mEq/L Final  . Potassium 07/02/2013 3.3* 3.5 - 5.1 mEq/L Final  . Chloride 07/02/2013 98  96 - 112 mEq/L Final  . CO2 07/02/2013 28  19 - 32 mEq/L Final  . Glucose, Bld 07/02/2013 182* 70 - 99 mg/dL Final  . BUN 07/02/2013 34* 6 - 23 mg/dL Final  . Creatinine, Ser 07/02/2013 1.5  0.4 - 1.5 mg/dL Final  . Calcium 07/02/2013 9.4  8.4 - 10.5 mg/dL Final  . GFR 07/02/2013 48.49* >60.00 mL/min Final  . Cholesterol 07/02/2013 173  0 - 200 mg/dL Final   ATP III Classification       Desirable:  < 200 mg/dL               Borderline High:  200 - 239 mg/dL          High:  > = 240 mg/dL  . Triglycerides 07/02/2013 162.0* 0.0 - 149.0 mg/dL Final   Normal:  <150 mg/dLBorderline High:  150 - 199 mg/dL  . HDL 07/02/2013 45.00  >39.00 mg/dL Final  . VLDL 07/02/2013 32.4  0.0 - 40.0 mg/dL Final  . LDL Cholesterol 07/02/2013 96  0 - 99 mg/dL Final  . Total CHOL/HDL Ratio 07/02/2013 4   Final                  Men          Women1/2 Average Risk     3.4          3.3Average Risk          5.0          4.42X Average Risk          9.6          7.13X Average Risk          15.0          11.0                        Physical Examination:  BP 108/64  Pulse 65  Temp(Src) 98.3 F (36.8 C)  Resp 16  Ht 6' (1.829 m)  Wt 252 lb 9.6 oz (114.579 kg)  BMI  34.25 kg/m2  SpO2 97%  No pedal edema    ASSESSMENT:  Diabetes type 2, uncontrolled   His blood sugar control is somewhat worse even though he has not changed his diet significantly He has gained a little weight and maybe eating out more.  Although A1c is 7.7% this is reasonably good for him with his medical history  He has not been on metformin because of renal  dysfunction lately but probably can benefit from this  Hypertension: Blood pressure is low normal, currently on amlodipine and low-dose Coreg. Takes chlorthalidone only as a diuretic as needed  Hyperlipidemia: LDL slightly higher than before although HDL 45 and triglycerides below 200, he is being managed by PCP/cardiologist and probably should be on high-dose statin anyway  Mild CKD: Creatinine stable at 1.5; medication management done by PCP and cardiologist  PLAN:   His fasting readings are significantly high and he will go up to 33 on her Levemir Also will need at least 2 more units for eating out at restaurants He will be given a trial of metformin ER 750 mg daily to help with insulin resistance since his GFR is about 50, advised him to take this with food. Advised him to bring his monitor for download on the next visit He can check less readings waking up and more after meals Discussed general principles of adjustment of both basal and bolus insulin and blood sugar targets  Since blood pressure is low normal he can take 2.5 mg of amlodipine until seen by his cardiologist  Will defer increasing dose of Lipitor to cardiologist or PCP  Counseling time over 50% of today's 25 minute visit  Elayne Snare 07/05/2013, 1:42 PM

## 2013-07-07 NOTE — Telephone Encounter (Signed)
I agree with the change that Dr.Kumar made.

## 2013-07-08 ENCOUNTER — Ambulatory Visit (INDEPENDENT_AMBULATORY_CARE_PROVIDER_SITE_OTHER): Payer: Medicare Other | Admitting: Internal Medicine

## 2013-07-08 ENCOUNTER — Other Ambulatory Visit (INDEPENDENT_AMBULATORY_CARE_PROVIDER_SITE_OTHER): Payer: Medicare Other

## 2013-07-08 ENCOUNTER — Encounter: Payer: Self-pay | Admitting: Internal Medicine

## 2013-07-08 ENCOUNTER — Other Ambulatory Visit: Payer: Self-pay

## 2013-07-08 VITALS — BP 100/70 | HR 65 | Temp 98.1°F | Ht 72.0 in | Wt 248.0 lb

## 2013-07-08 DIAGNOSIS — R5383 Other fatigue: Secondary | ICD-10-CM

## 2013-07-08 DIAGNOSIS — R5381 Other malaise: Secondary | ICD-10-CM

## 2013-07-08 DIAGNOSIS — E876 Hypokalemia: Secondary | ICD-10-CM

## 2013-07-08 DIAGNOSIS — M109 Gout, unspecified: Secondary | ICD-10-CM

## 2013-07-08 DIAGNOSIS — E785 Hyperlipidemia, unspecified: Secondary | ICD-10-CM

## 2013-07-08 DIAGNOSIS — I4891 Unspecified atrial fibrillation: Secondary | ICD-10-CM

## 2013-07-08 LAB — CBC WITH DIFFERENTIAL/PLATELET
BASOS ABS: 0 10*3/uL (ref 0.0–0.1)
BASOS PCT: 0.5 % (ref 0.0–3.0)
EOS ABS: 0.1 10*3/uL (ref 0.0–0.7)
Eosinophils Relative: 0.8 % (ref 0.0–5.0)
HCT: 50.5 % (ref 39.0–52.0)
HEMOGLOBIN: 16.9 g/dL (ref 13.0–17.0)
LYMPHS ABS: 1.5 10*3/uL (ref 0.7–4.0)
Lymphocytes Relative: 18.2 % (ref 12.0–46.0)
MCHC: 33.5 g/dL (ref 30.0–36.0)
MCV: 91.3 fl (ref 78.0–100.0)
MONO ABS: 0.7 10*3/uL (ref 0.1–1.0)
Monocytes Relative: 8.9 % (ref 3.0–12.0)
Neutro Abs: 5.8 10*3/uL (ref 1.4–7.7)
Neutrophils Relative %: 71.6 % (ref 43.0–77.0)
Platelets: 176 10*3/uL (ref 150.0–400.0)
RBC: 5.53 Mil/uL (ref 4.22–5.81)
RDW: 15.1 % — AB (ref 11.5–14.6)
WBC: 8 10*3/uL (ref 4.5–10.5)

## 2013-07-08 LAB — HEPATIC FUNCTION PANEL
ALK PHOS: 57 U/L (ref 39–117)
ALT: 14 U/L (ref 0–53)
AST: 13 U/L (ref 0–37)
Albumin: 3.8 g/dL (ref 3.5–5.2)
BILIRUBIN DIRECT: 0.2 mg/dL (ref 0.0–0.3)
Total Bilirubin: 1.1 mg/dL (ref 0.3–1.2)
Total Protein: 7 g/dL (ref 6.0–8.3)

## 2013-07-08 LAB — TSH: TSH: 0.69 u[IU]/mL (ref 0.35–5.50)

## 2013-07-08 LAB — URIC ACID: Uric Acid, Serum: 12.2 mg/dL — ABNORMAL HIGH (ref 4.0–7.8)

## 2013-07-08 LAB — CK: Total CK: 53 U/L (ref 7–232)

## 2013-07-08 MED ORDER — AMLODIPINE BESYLATE 2.5 MG PO TABS: 2.5000 mg | ORAL_TABLET | Freq: Every day | ORAL | Status: DC

## 2013-07-08 MED ORDER — ATORVASTATIN CALCIUM 10 MG PO TABS: 10.0000 mg | ORAL_TABLET | Freq: Every day | ORAL | Status: DC

## 2013-07-08 NOTE — Telephone Encounter (Signed)
The pts wife, Jaiyden, is advised and she verbalized understanding. Also Harlie states that the pts PCP has placed the pt on Metformin 750 mg daily

## 2013-07-08 NOTE — Progress Notes (Signed)
Subjective:    Patient ID: [name], male    DOB: 1940/07/28, 73 y.o.   MRN: [id number]  HPI  He has been experiencing fatigue without any specific trigger. This has persisted despite having had his pacemaker and defibrillator replaced. He denies any active GI symptoms. His colonoscopy is not due until 2016.  He has no snoring since he quit smoking according to his wife. He has no sleep apnea symptoms.  His last gout attack was over 12 months ago; these been taking celery seed supplement. He is also on allopurinol 100 mg daily. He rarely takes colchicine. He was instructed not to take Lipitor any day that he does takecolchicine  His lipids are up to date; total cholesterol is 173; triglycerides 162; HDL 45; and LDL 96 on Atorvastatin 10 mg daily. Liver function tests are due.A heart healthy diet is followed; unable to exercise .There is medication compliance with the statin.  Low dose ASA taken; also on warfarin.  He does take furosemide 40 mg as 80 mg twice a day. Also will take chlorthalidone 25 mg if his weight is greater than 242 pounds. He does take a potassium supplement 20 mg 1&1/2 pills twice a day. Despite this his potassium still 3.3.  They question the need for PSA monitor.    Review of Systems  Specifically denied are  chest pain, palpitations, dyspnea, or claudication.  Significant abdominal symptoms, memory deficit, or myalgias not present.  He also denies unexplained weight loss, abdominal pain, significant dyspepsia, dysphagia, melena, rectal bleeding, or persistently small caliber stools.  Dr. Dwyane Dee is managing his diabetes. Metformin was added because of A1c of 7.7%. His most recent creatinine was 1.5.     Objective:   Physical Exam Gen.: Adequately nourished in appearance but central obesity. Alert, appropriate and cooperative throughout exam. Head: Normocephalic without obvious abnormalities; pattern alopecia  Eyes: No corneal or conjunctival  inflammation noted. Pupils equal round reactive to light and accommodation. Extraocular motion intact. Ears: External  ear exam reveals no significant lesions or deformities. Hearing  aids bilaterally. Nose: External nasal exam reveals no deformity or inflammation. Nasal mucosa are pink and moist. No lesions or exudates noted.   Mouth: Oral mucosa and oropharynx reveal no lesions or exudates. Dentures in good repair. Neck: No deformities, masses, or tenderness noted.  Thyroid normal. Lungs: Normal respiratory effort; chest expands symmetrically. Lungs are clear to auscultation without rales, wheezes, or increased work of breathing. Heart:Distant irregular heart sounds. No gallop, click, or rub. No murmur. Abdomen: Bowel sounds normal; abdomen soft and nontender. No masses, or organomegaly .Large ventral hernia noted. Genitalia: Genitalia normal except for left varices. Prostate is normal without enlargement, asymmetry, nodularity, or induration                                 Musculoskeletal/extremities: Accentuated curvature of upper thoracic spine. No clubbing, cyanosis,  or significant extremity  deformity noted. Range of motion normal .Tone & strength normal. Hand joints normal Trace edema.  Fingernail  health good. Able to lie down & sit up w/o help. Negative SLR bilaterally Vascular: Carotid, radial artery, dorsalis pedis and  posterior tibial pulses are full and equal. No bruits present. Neurologic: Alert and oriented x3. Deep tendon reflexes asymmetrical at knees , decreased on R  Gait normal  including heel & toe walking . Rhomberg & finger to nose       Skin: Intact  without suspicious lesions or rashes.He is deeply tanned.    Lymph: No cervical, axillary, or inguinal lymphadenopathy present. Psych: Mood and affect are normal. Normally interactive                                                                                      Assessment & Plan: #1 fatigue; TSH and CBC and  differential will be drawn  #2 gout; uric acid monitor  Hypokalemia; interventions for dietary potassium discussed  Hyperlipidemia; liver function test ordered   #1 fatigue ; TSH & CBC & dif # 2 gout; uric acid will be checked #3 hypokalemia; dietary interventions discussed #4 Hyperlipidemia; fasting liver function tests. He was reminded he should not take colchicine and Lipitor. Lipitor will be held should he need to take colchicine acutely.

## 2013-07-08 NOTE — Telephone Encounter (Signed)
**Note De-identified [name] Obfuscation** LMTCB

## 2013-07-08 NOTE — Telephone Encounter (Signed)
Follow up     Returned nurses call

## 2013-07-08 NOTE — Progress Notes (Signed)
Pre visit review using our clinic review tool, if applicable. No additional management support is needed unless otherwise documented below in the visit note. 

## 2013-07-08 NOTE — Patient Instructions (Signed)
Your next office appointment will be determined based upon review of your pending labs . Those instructions will be transmitted to you through My Chart . To increase  Potassium (K+) increase citrus fruits & bananas in diet and use the salt substitute No Salt, which contains  potassium , to season food @ the table.

## 2013-07-15 ENCOUNTER — Ambulatory Visit (INDEPENDENT_AMBULATORY_CARE_PROVIDER_SITE_OTHER): Payer: Medicare Other | Admitting: *Deleted

## 2013-07-15 DIAGNOSIS — Z7901 Long term (current) use of anticoagulants: Secondary | ICD-10-CM

## 2013-07-15 DIAGNOSIS — I4891 Unspecified atrial fibrillation: Secondary | ICD-10-CM

## 2013-07-15 LAB — POCT INR: INR: 2.5

## 2013-07-15 NOTE — Telephone Encounter (Signed)
06/05/13 OV weight= 249 lbs home weight= 241 lbs.  The pt continues to have right leg edema and no sob at this time. 06/10/13 Weight= 242 lbs.  The pt's wife, Kallin, states that the pt continues to have right leg edema and no sob at this time. 06/11/13 Weight= 243 lbs.  The pt's wife, Tayte, states that the pt continues to have right leg edema and no sob at this time. The pt took Chlorthalidone dose today due to weight greater than 242 lbs. 06/12/13 Weight= 241 lbs.  The pt's wife, Nickoli, states that the pt continues to have right leg edema and no sob at this time. 06/13/13 Weight= 243 lbs.  The pt's wife, Adriel, states that the pt continues to have right leg edema and no sob at this time. The ot took a Chlorthalidone dose today due to weight greater than 242 lbs. 06/14/13 Weight= 241 lbs. The pt's wife, Jonty, states that the pt continues to have right leg edema and no sob at this time. 06/15/13 Weight= 242 lbs. The pt's wife, La, states that the pt continues to have right leg edema and no sob at this time. 06/16/13 Weight= 241 lbs. The pt's wife, Malacai, states that the pt continues to have right leg edema and no sob at this time. 06/17/13 Weight= 243 lbs. The pt's wife, Mat, states that the pt continues to have right leg edema and no sob at this time. The pt took Chlorthalidone dose today due to weight greater than 242 lbs. 06/18/13 Weight= 243 lbs.  The pt's wife, Achille, states that the pt continues to have right leg edema and no sob at this time. The pt took Chlorthalidone dose today due to weight greater than 242 lbs. 06/19/13 Weight= 245 lbs.  The pt's wife, Derwood, states that the pt continues to have right leg edema and no sob at this time. The pt took Chlorthalidone dose today due to weight greater than 242 lbs. 06/20/13 Weight= 244 lbs.  The pt's wife, Nuri, states that the pt continues to have right leg edema and no sob at this time. The pt took Chlorthalidone dose today due to  weight greater than 242 lbs. 06/21/13 Weight= 244 lbs.  The pt's wife, Jerrod, states that the pt continues to have right leg edema and no sob at this time.The pt took Chlorthalidone dose today due to weight greater than 242 lbs. 06/24/13 Weight= 244 lbs.  The pt's wife, Talmadge, states that the pt continues to have right leg edema and no sob at this time. The pt took Chlorthalidone dose today due to weight greater than 242 lbs. 06/25/13 Weight= 244 lbs.  The pt's wife, Sony, states that the pt continues to have right leg edema and no sob at this time. The pt took Chlorthalidone dose today due to weight greater than 242 lbs. 06/26/13 Weight= 244 lbs.  The pt's wife, Jamyron, states that the pt continues to have right leg edema and no sob at this time. The pt took Chlorthalidone dose today due to weight greater than 242 lbs. 06/27/13 Weight= 243 lbs.  The pt's wife, Usman, states that the pt continues to have right leg edema and no sob at this time. The pt took Chlorthalidone dose today due to weight greater than 242 lbs. 06/28/13 Weight= 243 lbs. The pt's wife, Jaelan, states that the pt continues to have right leg edema and no sob at this time. The pt took Chlorthalidone dose today due to weight greater than 242  lbs. 07/01/13 Weight= 244 lbs.  The pt's wife, Geraldine, states that the pt continues to have right leg edema and no sob at this time. The pt took Chlorthalidone dose today due to weight greater than 242 lbs. 07/03/13 Weight= 245 lbs. The pt's wife, Koltyn, states that the pt continues to have right leg edema and no sob at this time. The pt took Chlorthalidone dose today due to weight greater than 242 lbs. 07/05/13 Weight= 244 lbs.  The pt's wife, Ky, states that the pt continues to have right leg edema and no sob at this time. The pt took Chlorthalidone dose today due to weight greater than 242 lbs. 07/08/13 Weight= 241 lbs.  The pt's wife, Derrick, states that the pt continues to have right leg  edema and no sob at this time. 07/09/13 Weight= 242 lbs.  The pt's wife, Federick, states that the pt continues to have right leg edema and no sob at this time. 07/11/13 Weight= 242 lbs. The pt's wife, Filbert, states that the pt continues to have right leg edema and no sob at this time. 07/13/13 Weight= 242 lbs.  The pt's wife, Toribio, states that the pt continues to have right leg edema and no sob at this time. 07/15/13 Weight= 242 lbs.  The pt's wife, Logen, states that the pt continues to have right leg edema and no sob at this time.

## 2013-08-06 ENCOUNTER — Telehealth: Payer: Self-pay | Admitting: Cardiology

## 2013-08-06 NOTE — Telephone Encounter (Signed)
New message     Returned Lynn's call

## 2013-08-06 NOTE — Telephone Encounter (Signed)
The pts wife, Purnell, called to give me the pts weight (243 lbs.) for today.

## 2013-08-09 ENCOUNTER — Other Ambulatory Visit: Payer: Self-pay

## 2013-08-09 ENCOUNTER — Telehealth: Payer: Self-pay

## 2013-08-09 MED ORDER — COLCHICINE 0.6 MG PO TABS: 0.6000 mg | ORAL_TABLET | ORAL | Status: DC | PRN

## 2013-08-09 NOTE — Telephone Encounter (Signed)
**Note De-Identified Shiana Rappleye Obfuscation** 4/28  Weight= 243 lbs.  The pts wife, Samuele, states that the pt continues to have right leg edema and no SOB at this time. The pt took  Chlorthalidone 12.5 mg today due to weight greater than 242 lbs.  4/29  Weight= 245 lbs. The pts wife, Van, states that the pt continues to have right leg edema and no SOB at this time. The pt took  Chlorthalidone 12.5 mg today due to weight greater than 242 lbs.  4/30  Weight= 244 lbs.  The pts wife, Phineas, states that the pts right leg edema is better today and no SOB at this time. The pt took  Chlorthalidone 12.5 mg today due to weight greater than 242 lbs.  5/1  Weight= 242 lbs. Raekwan states that the pt right leg edema is better and that he has no sob at this time. 5/2  Weight= 244 lbs. Vincente states that the pt right leg edema is better and that he has no sob at this time. The pt took  Chlorthalidone 12.5 mg today due to weight greater than 242 lbs.  5/3  Weight= 244 lbs. Tavaris states that the pt right leg edema is better and that he has no sob at this time. The pt took  Chlorthalidone 12.5 mg today due to weight greater than 242 lbs.  5/4  Weight= 245 lbs. Alyn states that the pts right leg edema continues to improve and that he has no sob at this time. She states that he did not take Chlorthalidone dose today. 5/5  Weight= 243 lbs. Sora states that the pts right leg edema continues to improve and that he has no sob at this time. She states that he did not take Chlorthalidone dose today. 5/6  Weight= 243 lbs. Mayer states that the pts right leg edema continues to improve and that he has no sob at this time. She states that he did not take Chlorthalidone dose today. 5/7  Weight= 244 lbs. Senan reports no changes in the pts edema or sob at this time. The pt took  Chlorthalidone 12.5 mg today due to weight greater than 242 lbs.

## 2013-08-09 NOTE — Telephone Encounter (Signed)
**Note De-Identified Aleese Kamps Obfuscation** 5/8  Weight= 243 lbs. The pts wife, Taden, states that the pts right leg edema is better and no SOB at this time. The pt took  Chlorthalidone 12.5 mg today due to weight greater than 242 lbs.  5/9  Weight= 242 lbs. The pt took  Chlorthalidone 12.5 mg today due to weight greater than 242 lbs.  5/10  Weight= 243 lbs. The pt took  Chlorthalidone 12.5 mg today due to weight greater than 242 lbs. The pt took  Chlorthalidone 12.5 mg today due to weight greater than 242 lbs.  5/11  Weight= 243 lbs. The pt took  Chlorthalidone 12.5 mg today due to weight greater than 242 lbs. The pt took  Chlorthalidone 12.5 mg today due to weight greater than 242 lbs. 5/12  Weight= 246 lbs. The pt took  Chlorthalidone 12.5 mg today due to weight greater than 242 lbs. The pt took  Chlorthalidone 12.5 mg today due to weight greater than 242 lbs. 5/13  Weight= 244 lbs.  The pt took  Chlorthalidone 12.5 mg today due to weight greater than 242 lbs. The pt took  Chlorthalidone 12.5 mg today due to weight greater than 242 lbs. 5/14  Weight= 242 lbs. The pt took  Chlorthalidone 12.5 mg today due to weight greater than 242 lbs.  5/15 Weight= 244 lbs. The pt took  Chlorthalidone 12.5 mg today due to weight greater than 242 lbs. The pt took  Chlorthalidone 12.5 mg today due to weight greater than 242 lbs. 5/18 Weight= 244 lbs. The pt took  Chlorthalidone 12.5 mg today due to weight greater than 242 lbs. The pt took  Chlorthalidone 12.5 mg today due to weight greater than 242 lbs. 5/19 Weight= 243 lbs. The pt took  Chlorthalidone 12.5 mg today due to weight greater than 242 lbs. The pt took  Chlorthalidone 12.5 mg today due to weight greater than 242 lbs.

## 2013-08-19 ENCOUNTER — Other Ambulatory Visit: Payer: Self-pay | Admitting: *Deleted

## 2013-08-19 MED ORDER — INSULIN DETEMIR 100 UNIT/ML FLEXPEN: 35.0000 [IU] | PEN_INJECTOR | Freq: Every day | SUBCUTANEOUS | Status: DC

## 2013-08-26 NOTE — Telephone Encounter (Signed)
08/07/13  Weight= 245 lbs. The pts wife states that the pt continues to have right leg edema but that it is better and he has no SOB at this time. The pt took Chlorthalidone 12.5 mg dose today due to weight greater than 242 lbs. 08/08/13  Weight= 243 lbs. The pt wife reports no changes in the pts right leg edema and that he has no sob at this time. 08/09/13  Weight= 243 lbs. The pt wife reports no changes in the pts right leg edema and that he has no sob at this time. 08/10/13  Weight=  246 lbs.  The pt wife reports no changes in the pts right leg edema and that he has no sob at this time. The pt took Chlorthalidone 12.5 mg dose today due to weight greater than 242 lbs. 08/11/13  Weight= 245 lbs.  The pt wife reports no changes in the pts right leg edema and that he has no sob at this time. The pt took Chlorthalidone 12.5 mg dose today due to weight greater than 242 lbs. 08/12/13  Weight= 245 lbs.  The pt wife reports no changes in the pts right leg edema and that he has no sob at this time. The pt took Chlorthalidone 12.5 mg dose today due to weight greater than 242 lbs. 08/15/13  Weight= 247 lbs.  The pt wife reports no changes in the pts right leg edema and that he has no sob at this time.The pt took Chlorthalidone 12.5 mg dose today due to weight greater than 242 lbs. 08/16/13  Weight= 246 lbs. The pt wife reports no changes in the pts right leg edema and that he has no sob at this time. The pt took Chlorthalidone 12.5 mg dose today due to weight greater than 242 lbs. 08/18/13  Weight= 247 lbs.  The pt wife reports no changes in the pts right leg edema and that he has no sob at this time. The pt took Chlorthalidone 12.5 mg dose today due to weight greater than 242 lbs. 08/19/13  Weight= 247 lbs.  The pt wife reports no changes in the pts right leg edema and that he has no sob at this time. The pt took Chlorthalidone 12.5 mg dose today due to weight greater than 242 lbs. 08/20/13  Weight= 243 lbs.  The pt wife  reports no changes in the pts right leg edema and that he has no sob at this time. 08/21/13  Weight= 244 lbs.  The pt wife reports no changes in the pts right leg edema and that he has no sob at this time. The pt took Chlorthalidone 12.5 mg dose today due to weight greater than 242 lbs. 08/22/13  Weight= 245 lbs.  The pt wife reports no changes in the pts right leg edema and that he has no sob at this time. The pt took Chlorthalidone 12.5 mg dose today due to weight greater than 242 lbs. 08/23/13  Weight= 244 lbs.  The pt wife reports no changes in the pts right leg edema and that he has no sob at this time. The pt took Chlorthalidone 12.5 mg dose today due to weight greater than 242 lbs. 08/24/13  Weight= 246 lbs.  The pt wife reports no changes in the pts right leg edema and that he has no sob at this time. The pt took Chlorthalidone 12.5 mg dose today due to weight greater than 242 lbs. 08/25/13  Weight= 245 lbs.  The pt wife reports no changes in  the pts right leg edema and that he has no sob at this time. The pt took Chlorthalidone 12.5 mg dose today due to weight greater than 242 lbs. 08/26/13  Weight= 244 lbs.  The pt wife reports no changes in the pts right leg edema and that he has no sob at this time. The pt took Chlorthalidone 12.5 mg dose today due to weight greater than 242 lbs.

## 2013-08-26 NOTE — Telephone Encounter (Signed)
Follow up    Returning call back to nurse from Friday

## 2013-08-30 ENCOUNTER — Encounter: Payer: Self-pay | Admitting: Cardiology

## 2013-08-30 ENCOUNTER — Ambulatory Visit (INDEPENDENT_AMBULATORY_CARE_PROVIDER_SITE_OTHER): Payer: Medicare Other | Admitting: *Deleted

## 2013-08-30 ENCOUNTER — Ambulatory Visit (INDEPENDENT_AMBULATORY_CARE_PROVIDER_SITE_OTHER): Payer: Medicare Other | Admitting: Cardiology

## 2013-08-30 VITALS — BP 121/76 | HR 67 | Ht 72.0 in | Wt 250.8 lb

## 2013-08-30 DIAGNOSIS — I2510 Atherosclerotic heart disease of native coronary artery without angina pectoris: Secondary | ICD-10-CM

## 2013-08-30 DIAGNOSIS — M545 Low back pain, unspecified: Secondary | ICD-10-CM

## 2013-08-30 DIAGNOSIS — I4589 Other specified conduction disorders: Secondary | ICD-10-CM

## 2013-08-30 DIAGNOSIS — I4891 Unspecified atrial fibrillation: Secondary | ICD-10-CM

## 2013-08-30 DIAGNOSIS — Z789 Other specified health status: Secondary | ICD-10-CM

## 2013-08-30 DIAGNOSIS — I428 Other cardiomyopathies: Secondary | ICD-10-CM

## 2013-08-30 DIAGNOSIS — E785 Hyperlipidemia, unspecified: Secondary | ICD-10-CM

## 2013-08-30 DIAGNOSIS — I429 Cardiomyopathy, unspecified: Secondary | ICD-10-CM

## 2013-08-30 DIAGNOSIS — Z7901 Long term (current) use of anticoagulants: Secondary | ICD-10-CM

## 2013-08-30 DIAGNOSIS — Z7289 Other problems related to lifestyle: Secondary | ICD-10-CM

## 2013-08-30 DIAGNOSIS — I4892 Unspecified atrial flutter: Secondary | ICD-10-CM

## 2013-08-30 DIAGNOSIS — I509 Heart failure, unspecified: Secondary | ICD-10-CM

## 2013-08-30 DIAGNOSIS — N183 Chronic kidney disease, stage 3 unspecified: Secondary | ICD-10-CM

## 2013-08-30 DIAGNOSIS — Z95810 Presence of automatic (implantable) cardiac defibrillator: Secondary | ICD-10-CM

## 2013-08-30 DIAGNOSIS — I714 Abdominal aortic aneurysm, without rupture, unspecified: Secondary | ICD-10-CM

## 2013-08-30 DIAGNOSIS — I10 Essential (primary) hypertension: Secondary | ICD-10-CM

## 2013-08-30 LAB — POCT INR: INR: 2.3

## 2013-08-30 MED ORDER — CARVEDILOL 6.25 MG PO TABS: 6.2500 mg | ORAL_TABLET | Freq: Two times a day (BID) | ORAL | Status: DC

## 2013-08-30 MED ORDER — AMLODIPINE BESYLATE 5 MG PO TABS: 2.5000 mg | ORAL_TABLET | Freq: Every day | ORAL | Status: DC

## 2013-08-30 MED ORDER — WARFARIN SODIUM 2.5 MG PO TABS: ORAL_TABLET | ORAL | Status: DC

## 2013-08-30 NOTE — Patient Instructions (Signed)
Your physician recommends that you continue on your current medications as directed. Please refer to the Current Medication list given to you today. Your physician wants you to follow-up in: 4 months You will receive a reminder letter in the mail two months in advance. If you don't receive a letter, please call our office to schedule the follow-up appointment.  

## 2013-08-30 NOTE — Assessment & Plan Note (Signed)
Coronary disease is stable. No further workup needed. 

## 2013-08-30 NOTE — Assessment & Plan Note (Signed)
His lipids are treated with the maximum dose that he can tolerate. No change in therapy.

## 2013-08-30 NOTE — Assessment & Plan Note (Signed)
Over time he has done better with his back pain. Am certainly glad to see that he can ambulate well. No further workup.  As part of today's evaluation I spent greater than 25 minutes with his total care. More than half of this time is been spent with direct contact with the patient and his wife. We talked about all these issues. We had a careful long discussion about salt and fluid intake.

## 2013-08-30 NOTE — Assessment & Plan Note (Signed)
Blood pressure is controlled with his current therapy. No change.

## 2013-08-30 NOTE — Assessment & Plan Note (Signed)
His ICD is working well. Dr. Lovena Le  had reduced his carvedilol dose to allow him to have more natural conduction. This has helped. His dose will be continued as 6.25 mg twice a day.

## 2013-08-30 NOTE — Assessment & Plan Note (Signed)
He follows his weights daily. He adjusts his diuretic space on his weight. This is a good approach for him and keeping him out of the hospital. He's feeling well. No change in therapy at this time.

## 2013-08-30 NOTE — Assessment & Plan Note (Signed)
Long-term anticoagulation continues. No change in therapy.

## 2013-08-30 NOTE — Assessment & Plan Note (Signed)
He had a followup ultrasound and his abdominal aortic repair is quite stable. No further workup.

## 2013-08-30 NOTE — Assessment & Plan Note (Signed)
He is on the appropriate doses of medications that he can tolerate. No change in therapy.

## 2013-08-30 NOTE — Assessment & Plan Note (Signed)
His renal function was stable when checked recently. He does not need any labs today.

## 2013-08-30 NOTE — Assessment & Plan Note (Signed)
He has cut back his beer intake. No change in therapy.

## 2013-08-30 NOTE — Progress Notes (Signed)
Patient ID: [name], male   DOB: 03-26-1940, 73 y.o.   MRN: [id number]    HPI  Patient is seen today to followup multiple cardiac issues. After his last ICD interrogation adjustment, he has done well. Arrange for followup ultrasound of his abdominal aortic aneurysm repair. This showed a good long-term result. He's not having any chest pain. His weight is followed very carefully. He takes chlorthalidone in addition to his other medicines if his weight is above 243. He and his wife admits that he is drinking extra fluid. We discussed this again.  Allergies  Allergen Reactions  . Avelox [Moxifloxacin Hcl In Nacl] Swelling, Rash and Other (See Comments)    Patient became hypotensive after infusion started Because of a history of documented adverse serious drug reaction;Medi Alert bracelet  is recommended  . Penicillins     REACTION: anaphylaxis Because of a history of documented adverse serious drug reaction;Medi Alert bracelet  is recommended    Current Outpatient Prescriptions  Medication Sig Dispense Refill  . ACCU-CHEK FASTCLIX LANCETS MISC Use to check blood sugars 3 times per day  100 each  5  . allopurinol (ZYLOPRIM) 100 MG tablet Take 1 tablet (100 mg total) by mouth daily.  90 tablet  1  . amLODipine (NORVASC) 5 MG tablet Take 2.5 mg by mouth daily.      Marland Kitchen aspirin 81 MG tablet Take 81 mg by mouth daily.      Marland Kitchen atorvastatin (LIPITOR) 10 MG tablet Take 1 tablet (10 mg total) by mouth daily.  90 tablet  3  . carvedilol (COREG) 6.25 MG tablet Take 1 tablet (6.25 mg total) by mouth 2 (two) times daily.  180 tablet  3  . chlorthalidone (HYGROTON) 25 MG tablet Take 12.5 mg by mouth daily. Take when weight reaches 242 lbs. Or greater only      . Cholecalciferol 10000 UNITS CAPS Take 1,000 Units by mouth daily.      . colchicine (COLCRYS) 0.6 MG tablet Take 1 tablet (0.6 mg total) by mouth as needed (for gout).  30 tablet  0  . furosemide (LASIX) 40 MG tablet Take 2 tablets (80 mg  total) by mouth 2 (two) times daily.  120 tablet  0  . gabapentin (NEURONTIN) 100 MG capsule Take 100 mg by mouth 2 (two) times daily.       Marland Kitchen glucose blood (ACCU-CHEK SMARTVIEW) test strip Use as instructed to check blood sugars 3 times per day dx code 250.72  100 each  5  . Insulin Detemir (LEVEMIR FLEXPEN) 100 UNIT/ML Pen Inject 35 Units into the skin at bedtime.  15 mL  3  . ipratropium (ATROVENT) 0.06 % nasal spray Place 1 spray into both nostrils as directed.       Marland Kitchen losartan (COZAAR) 50 MG tablet Take 1 tablet (50 mg total) by mouth daily.  90 tablet  1  . metFORMIN (GLUCOPHAGE XR) 750 MG 24 hr tablet Take 1 tablet (750 mg total) by mouth daily with breakfast.  30 tablet  5  . naphazoline-pheniramine (NAPHCON-A) 0.025-0.3 % ophthalmic solution Place 1 drop into both eyes daily as needed for irritation.      . nitroGLYCERIN (NITROSTAT) 0.4 MG SL tablet Place 0.4 mg under the tongue every 5 (five) minutes as needed for chest pain.      Marland Kitchen NOVOLOG FLEXPEN 100 UNIT/ML SOPN FlexPen Inject 8 Units into the skin 2 (two) times daily.       . potassium chloride  SA (KLOR-CON M20) 20 MEQ tablet Take 1 tablet (20 mEq total) by mouth 3 (three) times daily.  270 tablet  1  . warfarin (COUMADIN) 2.5 MG tablet TAKE AS DIRECTED BY ANTICOAGULATION CLINIC  120 tablet  0   No current facility-administered medications for this visit.    History   Social History  . Marital Status: Married    Spouse Name: N/A    Number of Children: N/A  . Years of Education: N/A   Occupational History  . Not on file.   Social History Main Topics  . Smoking status: Former Smoker    Quit date: 03/22/1995  . Smokeless tobacco: Former Systems developer  . Alcohol Use: 1.2 oz/week    2 Cans of beer per week     Comment: beer  . Drug Use: No  . Sexual Activity: No   Other Topics Concern  . Not on file   Social History Narrative  . No narrative on file    Family History  Problem Relation Age of Onset  . Hypertension Mother    . Stroke Mother   . Diabetes Father   . Coronary artery disease Father   . Other Father     DVT    Past Medical History  Diagnosis Date  . Diabetes mellitus   . Hyperlipidemia   . Hypertension   . Pilonidal cyst   . Atrial fibrillation     Previous long-term amiodarone therapy with multiple cardioversions / amiodarone stopped September, 2009  . Atrial flutter     Started November, 2010, Left-sided and cannot ablate  . Left atrial thrombus     Remote past... cardioversions done since that time  . Wide-complex tachycardia   . Left ventricular ejection fraction less than 40%   . Gout   . AAA (abdominal aortic aneurysm)     Surgical repair  . Discolored skin   . S/P ICD (internal cardiac defibrillator) procedure     Dr. Lovena Le 2009... by the pacing  . SOB (shortness of breath)     Large left effusion/ thoracentesis/hospitalization/November, 2011... exudated.. cytology negative.. Dr.Wert.. no proof of mesothelioma  . Pericardial effusion   . Pleural effusion     Large loculated effusion on the left side November, 2011. This was tapped. It was exudative. Cytology revealed no cancer no proof of mesothelioma area pulmonary team felt that no further workup was needed  . S/P AAA repair   . Spinal stenosis     Surgery Dr.Elsner  . CAD (coronary artery disease)     Catheterization July, 2008... name and vein grafts patent but low cardiac output  . Warfarin anticoagulation   . Cardiomyopathy     Ischemic... ICD  . Combined systolic and diastolic CHF   . Venous insufficiency     Toe discoloration chronic  . Mitral regurgitation     Mild echo  . Aortic valve sclerosis   . Nasal drainage     Chronic  . Alcohol ingestion of more than four drinks per week     Excess beer  not a dependency problem  . Chronotropic incompetence     IV pacing rate adjusted  . Thrombophlebitis of superficial veins of upper extremities     Possible venous stenosis from defibrillator  . Pericardial  effusion     November, 2011 .. decreased during hospitalization  . Eye abnormality     Ophthalmologist questions a clot in one of his eyes, May, 2012  . Overweight  November, 2012  . Pleural thickening   . Ejection fraction     Ejection fraction has varied over time from 35-50%.,, Echoes are technically very difficult,,, EF 50%, echo, May 25, 2011, technically very difficult  . Drug therapy     Redness and swelling with Avelox infusion May 24, 2011  . COPD (chronic obstructive pulmonary disease)   . Myocardial infarction   . Spinal cord stimulator status     October, 2013  . Carotid artery disease     Doppler, December, 2013, 0-39% bilateral  . Pneumonia   . Arthritis   . Ventral hernia     April, 2014, result of his abdominal surgery  . Bony abnormality     Patient's manubrium is slightly displaced to the right    Past Surgical History  Procedure Laterality Date  . Colonoscopy w/ polypectomy    . Lumbar fusion    . Pilonidal cyst removal    . Surgery scrotal / testicular    . Coronary artery bypass graft  2004  . Abdominal aortic aneurysm repair  11/2002  . Bi-ventricular pacemaker insertion (crt-p)  02-11-2013    Pt with previously implanted MDT CRTD downgraded to CRTP by Dr Lovena Le 02-11-13  . Back surgery    . Incision and drainage abscess / hematoma of bursa / knee / thigh      Patient Active Problem List   Diagnosis Date Noted  . Abscess, scrotum 03/05/2013  . Combined systolic and diastolic CHF   . Ejection fraction   . Hypopotassemia 12/20/2012  . ACE-inhibitor cough 10/04/2012  . Ventral hernia   . Bony abnormality   . Cellulitis of scrotum 05/23/2012  . Carotid artery disease   . Spinal cord stimulator status   . 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 Atrial fibrillation   . Spinal stenosis   . CAD (coronary artery disease)   . Cardiomyopathy   . Venous insufficiency   . Mitral regurgitation   . Aortic valve sclerosis   .  Alcohol ingestion of more than four drinks per week   . Chronotropic incompetence   . Thrombophlebitis of superficial veins of upper extremities   . Pericardial effusion   . Overweight   . Pleural thickening   . Pleural effusion   . Drug therapy   . Hyperlipidemia   . Hypertension   . Atrial flutter   . Left atrial thrombus   . AAA (abdominal aortic aneurysm)   . S/P ICD (internal cardiac defibrillator) procedure   . Warfarin anticoagulation   . Polyneuropathy in diabetes(357.2) 01/07/2010  . DIVERTICULOSIS, COLON 06/10/2009  . Uncontrolled diabetes mellitus with peripheral circulatory disorder 03/11/2009  . CKD (chronic kidney disease), stage III 06/19/2008  . BACK PAIN, LUMBAR 06/12/2008  . 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 GOUT 07/27/2006  . DVT 07/27/2006  . CORONARY ARTERY BYPASS GRAFT, HX OF 07/27/2006    ROS   Patient denies fever, chills, headache, sweats, rash, change in vision, change in hearing, chest pain, cough, nausea or vomiting, urinary symptoms. All other systems are reviewed and are negative.  PHYSICAL EXAM  Patient looks quite good. He is here with his wife. Head is atraumatic. Sclera and conjunctiva are normal. There is no jugulovenous distention. Lungs are clear. Respiratory effort is nonlabored. Cardiac exam reveals S1 and S2. The rhythm is irregularly irregular. The rate is controlled. Abdomen is protuberant but stable. There is no significant peripheral edema. His legs look as good as  we have seen him in many years.  Filed Vitals:   08/30/13 1026  BP: 121/76  Pulse: 67  Height: 6' (1.829 m)  Weight: 250 lb 12.8 oz (113.762 kg)     ASSESSMENT & PLAN

## 2013-09-02 ENCOUNTER — Other Ambulatory Visit: Payer: Self-pay

## 2013-09-02 ENCOUNTER — Telehealth: Payer: Self-pay | Admitting: Cardiology

## 2013-09-02 MED ORDER — CHLORTHALIDONE 25 MG PO TABS: 12.5000 mg | ORAL_TABLET | Freq: Every day | ORAL | Status: DC

## 2013-09-02 MED ORDER — CHLORTHALIDONE 25 MG PO TABS: ORAL_TABLET | ORAL | Status: DC

## 2013-09-02 NOTE — Telephone Encounter (Signed)
The pts Chlorthalidone refill has been sent to [facility]. to fill, the pt is aware.

## 2013-09-02 NOTE — Telephone Encounter (Signed)
New message ° ° ° ° ° °Returning Lynn's call °

## 2013-09-02 NOTE — Telephone Encounter (Signed)
**Note De-Identified Taquilla Downum Obfuscation** The pts wife, Pearse, called in the pts daily weight of 244 lbs.

## 2013-09-02 NOTE — Telephone Encounter (Signed)
F/u   Pt returning your call from Friday.

## 2013-09-03 ENCOUNTER — Other Ambulatory Visit: Payer: Self-pay

## 2013-09-03 MED ORDER — ALLOPURINOL 100 MG PO TABS: 100.0000 mg | ORAL_TABLET | Freq: Every day | ORAL | Status: DC

## 2013-09-04 ENCOUNTER — Encounter: Payer: Self-pay | Admitting: Endocrinology

## 2013-09-04 ENCOUNTER — Ambulatory Visit (INDEPENDENT_AMBULATORY_CARE_PROVIDER_SITE_OTHER): Payer: Medicare Other | Admitting: Endocrinology

## 2013-09-04 VITALS — BP 118/70 | HR 65 | Temp 98.0°F | Resp 16 | Ht 72.0 in | Wt 252.0 lb

## 2013-09-04 DIAGNOSIS — E785 Hyperlipidemia, unspecified: Secondary | ICD-10-CM

## 2013-09-04 DIAGNOSIS — E1151 Type 2 diabetes mellitus with diabetic peripheral angiopathy without gangrene: Secondary | ICD-10-CM

## 2013-09-04 DIAGNOSIS — I798 Other disorders of arteries, arterioles and capillaries in diseases classified elsewhere: Secondary | ICD-10-CM

## 2013-09-04 DIAGNOSIS — IMO0002 Reserved for concepts with insufficient information to code with codable children: Secondary | ICD-10-CM

## 2013-09-04 DIAGNOSIS — E1159 Type 2 diabetes mellitus with other circulatory complications: Secondary | ICD-10-CM

## 2013-09-04 DIAGNOSIS — E1165 Type 2 diabetes mellitus with hyperglycemia: Principal | ICD-10-CM

## 2013-09-04 LAB — BASIC METABOLIC PANEL
BUN: 31 mg/dL — AB (ref 6–23)
CALCIUM: 9.1 mg/dL (ref 8.4–10.5)
CHLORIDE: 100 meq/L (ref 96–112)
CO2: 29 mEq/L (ref 19–32)
CREATININE: 1.6 mg/dL — AB (ref 0.4–1.5)
GFR: 44.06 mL/min — ABNORMAL LOW (ref >60.00)
Glucose, Bld: 206 mg/dL — ABNORMAL HIGH (ref 70–99)
Potassium: 3.8 mEq/L (ref 3.5–5.1)
Sodium: 139 mEq/L (ref 135–145)

## 2013-09-04 NOTE — Progress Notes (Signed)
Patient ID: [name], male   DOB: Dec 03, 1940, 73 y.o.   MRN: [id number]    Reason for Appointment : F/u for Type 2 Diabetes  History of Present Illness          Diagnosis: Type 2 diabetes mellitus, date of diagnosis: 2008       Past history: He was initially treated with Metformin and also given Amayl at some point. With this he had fair control of his diabetes with A1c had usually been over 7% except once in 2013.  He was taken off metformin probably in March this year after a hospitalization, possibly because of renal dysfunction He was started on Levemir insulin in May when his blood sugars were significantly higher, fasting readings averaging 220.  Levemir progressively increased and was on 35 units at night since 12/20/12 He has not been on any other medications either orally or injectable for his diabetes  Recent history:    Because of his A1c of over 10% in 10/14 on basal insulin alone he was started on NovoLog with each meal in 10/14 He   his NovoLog from a starting dose of 6 units to 12 units and postprandial readings  improved significantly Although he was able to reduce the dosage at mealtimes with improved diet he is now back on 12 units at each meal He does not adjust the dose based on what he is eating Usually has high readings after lunch when he is eating out and having hamburger and french fries Otherwise his blood sugars after meals are averaging around 160 Levemir dosage was changed in 4/15 because of relatively higher fasting readings but there still appeared to be the same Also was started on low dose metformin but this has not helped him and he has gained a couple pounds more  INSULIN regimen is described as:  33 Levemir hs NovoLog 12 units ac  Glucose monitoring:  done 3 times a day        Glucometer:  FreeStyle.     Blood Glucose readings from his diary:  PREMEAL Breakfast Lunch Dinner Bedtime Overall  Glucose range: 120-168      Mean/median:      155     POST-MEAL PC Breakfast PC Lunch PC Dinner  Glucose range:   83-235   112-217   Mean/median:  160 166    FASTING recently: Mostly 130-150, nonfasting 140-230 Hypoglycemia frequency:  none.           Self-care: The diet that the patient has been following is: Smaller portions and relatively balanced meals  Meals: 2 meals per day. Noon and 6 pm  Low fat, trying to eat more salads. Avoiding drinks with sugar and no juices       Physical activity: exercise: Minimal, limited by back pain           Dietician visit: Most recent: Unknown.  CDE visit: 11/14               Compliance with the medical regimen: Good  Retinal exam: Most recent: 6/14, reportedly normal  Weight control: This appears to be fluctuating significantly  Wt Readings from Last 3 Encounters:  09/04/13 252 lb (114.306 kg)  08/30/13 250 lb 12.8 oz (113.762 kg)  07/08/13 248 lb (112.492 kg)    Lab Results  Component Value Date   HGBA1C 7.7* 07/02/2013   HGBA1C 7.3* 03/28/2013   HGBA1C 10.2* 12/19/2012   Lab Results  Component Value Date   MICROALBUR 0.5  02/01/2013   LDLCALC 96 07/02/2013   CREATININE 1.5 07/02/2013       Medication List       This list is accurate as of: 09/04/13  1:41 PM.  Always use your most recent med list.               ACCU-CHEK FASTCLIX LANCETS Misc  Use to check blood sugars 3 times per day     allopurinol 100 MG tablet  Commonly known as:  ZYLOPRIM  Take 1 tablet (100 mg total) by mouth daily.     amLODipine 5 MG tablet  Commonly known as:  NORVASC  Take 0.5 tablets (2.5 mg total) by mouth daily.     aspirin 81 MG tablet  Take 81 mg by mouth daily.     atorvastatin 10 MG tablet  Commonly known as:  LIPITOR  Take 1 tablet (10 mg total) by mouth daily.     carvedilol 6.25 MG tablet  Commonly known as:  COREG  Take 1 tablet (6.25 mg total) by mouth 2 (two) times daily.     chlorthalidone 25 MG tablet  Commonly known as:  HYGROTON  Take 0.5 tablets (12.5 mg total) by  mouth daily. Take when weight reaches 242 lbs. Or greater only     Cholecalciferol 10000 UNITS Caps  Take 1,000 Units by mouth daily.     colchicine 0.6 MG tablet  Commonly known as:  COLCRYS  Take 1 tablet (0.6 mg total) by mouth as needed (for gout).     furosemide 40 MG tablet  Commonly known as:  LASIX  Take 2 tablets (80 mg total) by mouth 2 (two) times daily.     gabapentin 100 MG capsule  Commonly known as:  NEURONTIN  Take 100 mg by mouth 2 (two) times daily.     glucose blood test strip  Commonly known as:  ACCU-CHEK SMARTVIEW  Use as instructed to check blood sugars 3 times per day dx code 250.72     Insulin Detemir 100 UNIT/ML Pen  Commonly known as:  LEVEMIR FLEXPEN  Inject 35 Units into the skin at bedtime.     ipratropium 0.06 % nasal spray  Commonly known as:  ATROVENT  Place 1 spray into both nostrils as directed.     losartan 50 MG tablet  Commonly known as:  COZAAR  Take 1 tablet (50 mg total) by mouth daily.     metFORMIN 750 MG 24 hr tablet  Commonly known as:  GLUCOPHAGE XR  Take 1 tablet (750 mg total) by mouth daily with breakfast.     naphazoline-pheniramine 0.025-0.3 % ophthalmic solution  Commonly known as:  NAPHCON-A  Place 1 drop into both eyes daily as needed for irritation.     nitroGLYCERIN 0.4 MG SL tablet  Commonly known as:  NITROSTAT  Place 0.4 mg under the tongue every 5 (five) minutes as needed for chest pain.     NOVOLOG FLEXPEN 100 UNIT/ML FlexPen  Generic drug:  insulin aspart  Inject 8 Units into the skin 2 (two) times daily.     potassium chloride SA 20 MEQ tablet  Commonly known as:  KLOR-CON M20  Take 1 tablet (20 mEq total) by mouth 3 (three) times daily.     warfarin 2.5 MG tablet  Commonly known as:  COUMADIN  TAKE AS DIRECTED BY ANTICOAGULATION CLINIC        Allergies:  Allergies  Allergen Reactions  . Avelox [Moxifloxacin Hcl In Nacl] Swelling,  Rash and Other (See Comments)    Patient became hypotensive  after infusion started Because of a history of documented adverse serious drug reaction;Medi Alert bracelet  is recommended  . Penicillins     REACTION: anaphylaxis Because of a history of documented adverse serious drug reaction;Medi Alert bracelet  is recommended    Past Medical History  Diagnosis Date  . Diabetes mellitus   . Hyperlipidemia   . Hypertension   . Pilonidal cyst   . Atrial fibrillation     Previous long-term amiodarone therapy with multiple cardioversions / amiodarone stopped September, 2009  . Atrial flutter     Started November, 2010, Left-sided and cannot ablate  . Left atrial thrombus     Remote past... cardioversions done since that time  . Wide-complex tachycardia   . Left ventricular ejection fraction less than 40%   . Gout   . AAA (abdominal aortic aneurysm)     Surgical repair  . Discolored skin   . S/P ICD (internal cardiac defibrillator) procedure     Dr. Lovena Le 2009... by the pacing  . SOB (shortness of breath)     Large left effusion/ thoracentesis/hospitalization/November, 2011... exudated.. cytology negative.. Dr.Wert.. no proof of mesothelioma  . Pericardial effusion   . Pleural effusion     Large loculated effusion on the left side November, 2011. This was tapped. It was exudative. Cytology revealed no cancer no proof of mesothelioma area pulmonary team felt that no further workup was needed  . S/P AAA repair   . Spinal stenosis     Surgery Dr.Elsner  . CAD (coronary artery disease)     Catheterization July, 2008... name and vein grafts patent but low cardiac output  . Warfarin anticoagulation   . Cardiomyopathy     Ischemic... ICD  . Combined systolic and diastolic CHF   . Venous insufficiency     Toe discoloration chronic  . Mitral regurgitation     Mild echo  . Aortic valve sclerosis   . Nasal drainage     Chronic  . Alcohol ingestion of more than four drinks per week     Excess beer  not a dependency problem  . Chronotropic  incompetence     IV pacing rate adjusted  . Thrombophlebitis of superficial veins of upper extremities     Possible venous stenosis from defibrillator  . Pericardial effusion     November, 2011 .. decreased during hospitalization  . Eye abnormality     Ophthalmologist questions a clot in one of his eyes, May, 2012  . Overweight     November, 2012  . Pleural thickening   . Ejection fraction     Ejection fraction has varied over time from 35-50%.,, Echoes are technically very difficult,,, EF 50%, echo, May 25, 2011, technically very difficult  . Drug therapy     Redness and swelling with Avelox infusion May 24, 2011  . COPD (chronic obstructive pulmonary disease)   . Myocardial infarction   . Spinal cord stimulator status     October, 2013  . Carotid artery disease     Doppler, December, 2013, 0-39% bilateral  . Pneumonia   . Arthritis   . Ventral hernia     April, 2014, result of his abdominal surgery  . Bony abnormality     Patient's manubrium is slightly displaced to the right    Past Surgical History  Procedure Laterality Date  . Colonoscopy w/ polypectomy    . Lumbar fusion    .  Pilonidal cyst removal    . Surgery scrotal / testicular    . Coronary artery bypass graft  2004  . Abdominal aortic aneurysm repair  11/2002  . Bi-ventricular pacemaker insertion (crt-p)  02-11-2013    Pt with previously implanted MDT CRTD downgraded to CRTP by Dr Lovena Le 02-11-13  . Back surgery    . Incision and drainage abscess / hematoma of bursa / knee / thigh      Family History  Problem Relation Age of Onset  . Hypertension Mother   . Stroke Mother   . Diabetes Father   . Coronary artery disease Father   . Other Father     DVT    Social History:  reports that he quit smoking about 18 years ago. He has quit using smokeless tobacco. He reports that he drinks about 1.2 ounces of alcohol per week. He reports that he does not use illicit drugs.    Review of Systems       Lipids:  Treated with Lipitor, currently only taking 10 mg Hyperlipidemia: LDL slightly higher than before although HDL 45 and triglycerides below 200, he is being managed by PCP/cardiologist and probably should be on high-dose statin anyway   Lab Results  Component Value Date   LDLCALC 96 07/02/2013                  The blood pressure has been under control with current regimen of chlorthalidone, amlodipine and losartan, BP at home 110-120      He has had pains and tingling in his feet and lower legs. Has known neuropathy  HYPOKALEMIA: He is already on 20 mg twice a day of potassium but is also on furosemide on and off and chlorthalidone. No muscle cramps   LABS:  Anti-coag visit on 08/30/2013  Component Date Value Ref Range Status  . INR 08/30/2013 2.3   Final    Physical Examination:  BP 118/70  Pulse 65  Temp(Src) 98 F (36.7 C)  Resp 16  Ht 6' (1.829 m)  Wt 252 lb (114.306 kg)  BMI 34.17 kg/m2  SpO2 97%  No pedal edema    ASSESSMENT:  Diabetes type 2  His blood sugar control based on his home readings since about the same as the last time Still having relatively high fasting readings and only occasional readings after meals He has gained a little weight and not clear up reason He is not benefiting from low dose metformin as yet He is fairly combined with checking his blood sugars and overall is to follow his diet Unable to exercise  Although the last A1c of 7.7% is relatively high this is reasonably good for him with his medical history and age   Hypokalemia: Taking extra potassium tablet and will need to check this again today  Mild CKD: Creatinine stable at 1.5; this will be rechecked today  PLAN:   We'll continue the same basic dose of insulin for now and check his fructosamine today Again discussed that that he will need at least 2 more units NovoLog for eating out at restaurants or higher fat meals If creatinine is improved will continue metformin but try 1500  mg a day   KUMAR,AJAY 09/04/2013, 1:41 PM

## 2013-09-04 NOTE — Patient Instructions (Signed)
May use 14 Novolog for higher fat meals or carbs

## 2013-09-05 ENCOUNTER — Telehealth: Payer: Self-pay | Admitting: Cardiology

## 2013-09-05 NOTE — Telephone Encounter (Signed)
The pts wife, Jemari, called to report the pts weight of 244 lbs.

## 2013-09-05 NOTE — Telephone Encounter (Signed)
Follow up    Pt's wife called and would like a call back please.

## 2013-09-06 LAB — FRUCTOSAMINE: FRUCTOSAMINE: 271 umol/L — AB (ref 190–270)

## 2013-09-09 ENCOUNTER — Ambulatory Visit (INDEPENDENT_AMBULATORY_CARE_PROVIDER_SITE_OTHER): Payer: Medicare Other | Admitting: *Deleted

## 2013-09-09 DIAGNOSIS — I429 Cardiomyopathy, unspecified: Secondary | ICD-10-CM

## 2013-09-09 DIAGNOSIS — I428 Other cardiomyopathies: Secondary | ICD-10-CM

## 2013-09-09 DIAGNOSIS — I509 Heart failure, unspecified: Secondary | ICD-10-CM

## 2013-09-09 NOTE — Progress Notes (Signed)
Remote ICD transmission.   

## 2013-09-11 ENCOUNTER — Telehealth: Payer: Self-pay | Admitting: Cardiology

## 2013-09-11 NOTE — Telephone Encounter (Signed)
The pts wife, Deundra, states that Dr Dwyane Dee advised the pt to stop taking Losartan due to his recent lab results from 09/04/13. Emeril states that Dr Dwyane Dee said that Mr Ruda's BP is low/normal and that his kidney levels are elevated. See BMET results from 6/17. Izack states that she wants [name] to be aware. Will forward to [name] as Juluis Rainier.

## 2013-09-11 NOTE — Telephone Encounter (Signed)
LMTCB

## 2013-09-11 NOTE — Telephone Encounter (Signed)
New message          Pt wife would like for [name] to give her a call / pt would not disclose any other information

## 2013-09-16 LAB — MDC_IDC_ENUM_SESS_TYPE_REMOTE
Battery Remaining Longevity: 124 mo
Battery Voltage: 3.01 V
Brady Statistic AS VP Percent: 14.22 %
Brady Statistic RA Percent Paced: 0 %
Date Time Interrogation Session: 20150622143319
HIGH POWER IMPEDANCE MEASURED VALUE: 80 Ohm
HighPow Impedance: 190 Ohm
Lead Channel Impedance Value: 4047 Ohm
Lead Channel Impedance Value: 456 Ohm
Lead Channel Impedance Value: 494 Ohm
Lead Channel Impedance Value: 532 Ohm
Lead Channel Pacing Threshold Amplitude: 0.875 V
Lead Channel Sensing Intrinsic Amplitude: 1.25 mV
Lead Channel Sensing Intrinsic Amplitude: 13.125 mV
Lead Channel Sensing Intrinsic Amplitude: 13.125 mV
Lead Channel Setting Sensing Sensitivity: 0.3 mV
MDC IDC MSMT LEADCHNL LV IMPEDANCE VALUE: 4047 Ohm
MDC IDC MSMT LEADCHNL RA SENSING INTR AMPL: 1.25 mV
MDC IDC MSMT LEADCHNL RV PACING THRESHOLD PULSEWIDTH: 0.4 ms
MDC IDC SET LEADCHNL RV PACING AMPLITUDE: 2.5 V
MDC IDC SET LEADCHNL RV PACING PULSEWIDTH: 0.4 ms
MDC IDC SET ZONE DETECTION INTERVAL: 450 ms
MDC IDC STAT BRADY AP VP PERCENT: 0 %
MDC IDC STAT BRADY AP VS PERCENT: 0 %
MDC IDC STAT BRADY AS VS PERCENT: 85.78 %
MDC IDC STAT BRADY RV PERCENT PACED: 18.72 %
Zone Setting Detection Interval: 300 ms
Zone Setting Detection Interval: 340 ms
Zone Setting Detection Interval: 350 ms

## 2013-09-17 ENCOUNTER — Telehealth: Payer: Self-pay

## 2013-09-17 NOTE — Telephone Encounter (Signed)
08/27/13 Weight= 245 lbs. The pts wife reports no changes in the pts right leg edema and that he has no SOB at this time. The pt took Chlorthalidone dose today due to weight greater than 242 lbs. 08/28/13 Weight= 243 lbs. The pts wife reports no changes in the pts right leg edema and that he has no SOB at this time. Moshe, the pts wife, states that the pt did not take Chlorthalidone dose today because he feels that he has had to take dose so often that he wanted to hold todays dose even though his weight is greater than 242 lbs. 08/29/13  Weight= 242 lbs.  The pts wife reports no changes in the pts right leg edema and that he has no SOB at this time. 08/30/13  Weight= 245 lbs.  The pt had OV with [name] today-no changes in therapy were made. The pts wife reports no changes in the pts right leg edema and that he has no SOB at this time. The pt took Chlorthalidone dose today due to weight greater than 242 lbs. 09/02/13  Weight= 244 lbs. The pts wife reports no changes in the pts right leg edema and that he has no SOB at this time. The pt took Chlorthalidone dose today due to weight greater than 242 lbs. 09/04/13  Weight= 2145 lbs.   The pts wife reports no changes in the pts right leg edema and that he has no SOB at this time. The pt took Chlorthalidone dose today due to weight greater than 242 lbs. 09/05/13  Weight= 244 lbs.    The pts wife reports no changes in the pts right leg edema and that he has no SOB at this time. Kendrik, the pts wife, states that the pt did not take Chlorthalidone dose today because he feels that he has had to take dose so often that he wanted to hold todays dose even though his weight is greater than 242 lbs. 09/10/13  Weight= 244 lbs. he pts wife reports no changes in the pts right leg edema and that he has no SOB at this time. The pt took Chlorthalidone dose today due to weight greater than 242 lbs. 09/11/13  Weight= 244 lbs. The pts wife reports no changes in the pts right leg edema  and that he has no SOB at this time. Ruperto, the pts wife, states that the pt did not take Chlorthalidone dose today because he feels that he has had to take dose so often that he wanted to hold todays dose even though his weight is greater than 242 lbs. 09/13/13 Weight= 242 lbs.  The pts wife reports no changes in the pts right leg edema and that he has no SOB at this time.

## 2013-09-25 ENCOUNTER — Encounter: Payer: Self-pay | Admitting: Cardiology

## 2013-09-27 ENCOUNTER — Encounter: Payer: Self-pay | Admitting: Cardiology

## 2013-09-30 ENCOUNTER — Encounter: Payer: Self-pay | Admitting: Internal Medicine

## 2013-10-01 ENCOUNTER — Telehealth: Payer: Self-pay | Admitting: Cardiology

## 2013-10-01 NOTE — Telephone Encounter (Signed)
**Note De-Identified Darrelle Wiberg Obfuscation** The pts wife called to give pts weights from the last 2 weeks. All weights noted in separate phone note and [name] is advised.

## 2013-10-01 NOTE — Telephone Encounter (Signed)
Please call patient back, if she doesn't answer please call her cell phone [phone].

## 2013-10-03 ENCOUNTER — Other Ambulatory Visit: Payer: Self-pay

## 2013-10-03 NOTE — Telephone Encounter (Signed)
**Note De-Identified Ksean Vale Obfuscation** 09/14/13 Weight= 242 lbs. The pts wife states that the pt continues to have edema in his right leg only and that he has no SOB at this time. 09/15/13 Weight= 242 lbs.  The pts wife reports no changes in the pts edema or SOB at this time. 09/16/13 Weight= 242 lbs.  The pts wife reports no changes in the pts edema or SOB at this time. 09/17/13 Weight= 245 lbs. The pts wife reports no changes in the pts edema or SOB at this time. The pt took a Chlorthalidone 12.5 mg dose today due to his weight greater than 242 lbs.

## 2013-10-04 ENCOUNTER — Telehealth: Payer: Self-pay

## 2013-10-04 NOTE — Telephone Encounter (Signed)
**Note De-Identified [name] Obfuscation** 09/18/13  Weight= 244 lbs.  The pts wife reports that the pt continues to have right leg edema and no SOB at this time. The pt took a Chlorthalidone dose due to weight greater that 242 lbs. 09/19/13  Weight= 242 lbs.  The pts wife states that there is no change in the pts edema or SOB at this time. 09/20/13  Weight= 242 lbs.  The pts wife reports no changes in the pts edema or SOB at this time. 09/21/13  Weight= 245 lbs.  The pts wife reports that the pt continues to have right leg edema and no SOB at this time. The pt took a Chlorthalidone dose due to weight greater that 242 lbs. 09/22/13  Weight= 245 lbs.  The pts wife reports that the pt continues to have right leg edema and no SOB at this time. The pt took a Chlorthalidone dose due to weight greater that 242 lbs. 09/23/13  Weight=  246 lbs.  The pts wife reports that the pt continues to have right leg edema and no SOB at this time. The pt took a Chlorthalidone dose due to weight greater that 242 lbs. 09/24/13  Weight= 248 lbs.  The pts wife reports that the pt continues to have right leg edema and no SOB at this time. The pt took a Chlorthalidone dose due to weight greater that 242 lbs. 09/25/13  Weight= 249 lbs.  The pts wife reports that the pt continues to have right leg edema and no SOB at this time. The pt took a Chlorthalidone dose due to weight greater that 242 lbs. 09/26/13  Weight= 248 lbs.  The pts wife reports that the pt continues to have right leg edema and no SOB at this time. The pt took a Chlorthalidone dose due to weight greater that 242 lbs. 09/27/13  Weight=  246 lbs.  The pts wife reports that the pt continues to have right leg edema and no SOB at this time. The pt took a Chlorthalidone dose due to weight greater that 242 lbs. 09/28/13  Weight= 244 lbs.  The pts wife reports that the pt continues to have right leg edema and no SOB at this time. The pt took a Chlorthalidone dose due to weight greater that 242 lbs. 09/29/13  Weight= 245 lbs.  The pts  wife reports that the pt continues to have right leg edema and no SOB at this time. The pt took a Chlorthalidone dose due to weight greater that 242 lbs. 09/30/13  Weight= 244 lbs.  The pts wife reports that the pt continues to have right leg edema and no SOB at this time. The pt took a Chlorthalidone dose due to weight greater that 242 lbs. 10/01/13  Weight= 244 lbs.  The pts wife reports that the pt continues to have right leg edema and no SOB at this time. The pt took a Chlorthalidone dose due to weight greater that 242 lbs. 10/02/13  Weight= 245 lbs.  The pts wife reports that the pt continues to have right leg edema and no SOB at this time. The pt took a Chlorthalidone dose due to weight greater that 242 lbs. 10/03/13  Weight= 243 lbs.   The pts wife states that there is no change in the pts edema or SOB at this time.

## 2013-10-09 ENCOUNTER — Other Ambulatory Visit: Payer: Self-pay

## 2013-10-11 ENCOUNTER — Ambulatory Visit (INDEPENDENT_AMBULATORY_CARE_PROVIDER_SITE_OTHER): Payer: Medicare Other | Admitting: Pharmacist

## 2013-10-11 DIAGNOSIS — I4891 Unspecified atrial fibrillation: Secondary | ICD-10-CM

## 2013-10-11 DIAGNOSIS — Z7901 Long term (current) use of anticoagulants: Secondary | ICD-10-CM

## 2013-10-11 LAB — POCT INR: INR: 2.7

## 2013-10-16 NOTE — Telephone Encounter (Signed)
Follow up      Wife returning Rn call please give pt a call back.

## 2013-10-16 NOTE — Telephone Encounter (Signed)
Wife would [name] to return call tomorrow. She will give [name] the weights at that time Horton Chin RN

## 2013-10-17 NOTE — Telephone Encounter (Signed)
**Note De-Identified Jahmier Willadsen Obfuscation** The pts wife, Nicole, called to report the pts weight of 242 lbs. yesterday.

## 2013-10-21 ENCOUNTER — Telehealth: Payer: Self-pay

## 2013-10-21 NOTE — Telephone Encounter (Signed)
10/04/13  Weight= 244 lbs. The pts wife, [name], states that the pt continues to have right leg edema that has not changed and no sob at this time. The pt took a Chlorthalidone dose today due to weight greater than 242 lbs.  10/09/13  Weight= 243 lbs. [name] reports no changes in the pt edema or SOB at this time.  10/10/13  Weight= 245 lbs.  The pts wife, [name], states that the pt continues to have right leg edema that has not changed and no sob at this time. The pt took a Chlorthalidone dose today due to weight greater than 242 lbs.  10/11/13  Weight= 245 lbs. The pts wife, [name], states that the pt continues to have right leg edema that has not changed and no sob at this time. The pt took a Chlorthalidone dose today due to weight greater than 242 lbs.  10/14/13  Weight= 246  [name] reports no changes in the pt edema or SOB at this time. The pt took a Chlorthalidone dose today due to weight greater than 242 lbs.  10/15/13  Weight= 245 lbs.  [name] reports no changes in the pt edema or SOB at this time. The pt took a Chlorthalidone dose today due to weight greater than 242 lbs.  10/17/13  Weight= 242  [name] reports no changes in the pt edema or SOB at this time.

## 2013-11-06 ENCOUNTER — Telehealth: Payer: Self-pay

## 2013-11-06 NOTE — Telephone Encounter (Signed)
**Note De-Identified Almalik Weissberg Obfuscation** 10/21/13  Weight= 243 lbs. The pts wife, Nalin, states that the pt continues to have right leg edema and no SOB at this time. 10/23/13  Weight= 243 lbs. The pts wife, Khoen, states that the pt continues to have right leg edema and no SOB at this time. 10/24/13  Weight= 246 lbs.  The pts wife, Samir, states that the pt continues to have right leg edema and no SOB at this time. The pt took a Chlorthalidone dose today due to weight greater than 242 lbs.  10/25/13  Weight= 244 lbs.  The pts wife, [name], states that the pt continues to have right leg edema and no SOB at this time. The pt took a Chlorthalidone dose today due to weight greater than 242 lbs.

## 2013-11-18 ENCOUNTER — Ambulatory Visit (INDEPENDENT_AMBULATORY_CARE_PROVIDER_SITE_OTHER): Payer: Medicare Other | Admitting: *Deleted

## 2013-11-18 DIAGNOSIS — Z7901 Long term (current) use of anticoagulants: Secondary | ICD-10-CM

## 2013-11-18 DIAGNOSIS — I4891 Unspecified atrial fibrillation: Secondary | ICD-10-CM

## 2013-11-18 LAB — POCT INR: INR: 2.4

## 2013-11-18 NOTE — Telephone Encounter (Signed)
10/26/13  Weight= 244 lbs.  The pts wife, [name], states that he pt continues to have right leg edema and no sob at this time. The pt took a Chlorthalidone dose today due to weight greater than 242 lbs. 10/27/13  Weight= 243 lbs.  The pts wife, [name], states that he pt continues to have right leg edema and no sob at this time.  10/28/13  Weight= 242 lbs.  [name] reports no changes in the pts right leg edema and he has no SOB at this time. 10/29/13 Weight= 243 lbs.   The pts wife, [name], states that he pt continues to have right leg edema and no sob at this time.  10/30/13  Weight= 244 lbs.  The pts wife, [name], states that he pt continues to have right leg edema and no sob at this time. The pt took a Chlorthalidone dose today due to weight greater than 242 lbs. 10/31/13  Weight= 244 lbs.   [name] reports no changes in the pts right leg edema and he has no SOB at this time. The pt took a Chlorthalidone dose today due to weight greater than 242 lbs. 11/01/13  Weight= 244 lbs.  The pts wife, [name], states that he pt continues to have right leg edema and no sob at this time. The pt took a Chlorthalidone dose today due to weight greater than 242 lbs. 11/02/13  Weight= 241 lbs.  [name] reports no changes in the pts right leg edema and he has no SOB at this time. 11/03/13  Weight= 239 lbs.  [name] reports no changes in the pts right leg edema and he has no SOB at this time 11/04/13  Weight= 240 lbs.   [name] states that he pt continues to have right leg edema and no sob at this time. 11/05/13  Weight= 241 lbs.   [name], states that he pt continues to have right leg edema and no sob at this time.  11/06/13  Weight= 241 lbs.  [name] reports no changes in the pts right leg edema and he has no SOB at this time. 11/07/13  Weight=  241 lbs. [name] reports no changes in the pts right leg edema and he has no SOB at this time 11/09/13  Weight= 242 lbs.  [name] reports no changes in the pts right leg edema and he has  no SOB at this time. 11/10/13  Weight= 241 lbs.  [name] states that he pt continues to have right leg edema and no sob at this time. 11/11/13  Weight= 244 lbs.   The pts wife, [name], states that he pt continues to have right leg edema and no sob at this time. The pt took a Chlorthalidone dose today due to weight greater than 242 lbs. 11/12/13  Weight= 244 lbs.   The pts wife, [name], states that he pt continues to have right leg edema and no sob at this time. The pt took a Chlorthalidone dose today due to weight greater than 242 lbs. 11/13/13  Weight= 242 lbs.  The pts wife, [name], states that he pt continues to have right leg edema and no sob at this time. 11/14/13  Weight= 241 lbs.    [name] reports no changes in the pts right leg edema and he has no SOB at this time 11/15/13  Weight= 244 lbs.   [name] reports no changes in the pts right leg edema and he has no SOB at this time. The pt took a Chlorthalidone dose today due to weight greater  than 242 lbs. 11/16/13  Weight= 242 lbs.   [name] states that he pt continues to have right leg edema and no sob at this time. 11/17/13  Weight= 242 lbs.   [name] states that he pt continues to have right leg edema and no sob at this time. 11/18/13  Weight= 243 lbs.  [name] reports no changes in the pts right leg edema and he has no SOB at this time.

## 2013-11-20 ENCOUNTER — Other Ambulatory Visit: Payer: Self-pay

## 2013-11-20 MED ORDER — POTASSIUM CHLORIDE CRYS ER 20 MEQ PO TBCR: 20.0000 meq | EXTENDED_RELEASE_TABLET | Freq: Three times a day (TID) | ORAL | Status: DC

## 2013-12-02 ENCOUNTER — Telehealth: Payer: Self-pay | Admitting: Cardiology

## 2013-12-02 ENCOUNTER — Other Ambulatory Visit (INDEPENDENT_AMBULATORY_CARE_PROVIDER_SITE_OTHER): Payer: Medicare Other

## 2013-12-02 DIAGNOSIS — I798 Other disorders of arteries, arterioles and capillaries in diseases classified elsewhere: Secondary | ICD-10-CM

## 2013-12-02 DIAGNOSIS — E1151 Type 2 diabetes mellitus with diabetic peripheral angiopathy without gangrene: Secondary | ICD-10-CM

## 2013-12-02 DIAGNOSIS — E1165 Type 2 diabetes mellitus with hyperglycemia: Principal | ICD-10-CM

## 2013-12-02 DIAGNOSIS — IMO0002 Reserved for concepts with insufficient information to code with codable children: Secondary | ICD-10-CM

## 2013-12-02 DIAGNOSIS — E1159 Type 2 diabetes mellitus with other circulatory complications: Secondary | ICD-10-CM

## 2013-12-02 LAB — COMPREHENSIVE METABOLIC PANEL
ALT: 18 U/L (ref 0–53)
AST: 20 U/L (ref 0–37)
Albumin: 3.7 g/dL (ref 3.5–5.2)
Alkaline Phosphatase: 56 U/L (ref 39–117)
BILIRUBIN TOTAL: 1.1 mg/dL (ref 0.2–1.2)
BUN: 26 mg/dL — ABNORMAL HIGH (ref 6–23)
CALCIUM: 9.3 mg/dL (ref 8.4–10.5)
CHLORIDE: 101 meq/L (ref 96–112)
CO2: 24 meq/L (ref 19–32)
Creatinine, Ser: 1.6 mg/dL — ABNORMAL HIGH (ref 0.4–1.5)
GFR: 44.03 mL/min — AB (ref >60.00)
Glucose, Bld: 174 mg/dL — ABNORMAL HIGH (ref 70–99)
Potassium: 3.4 mEq/L — ABNORMAL LOW (ref 3.5–5.1)
SODIUM: 140 meq/L (ref 135–145)
TOTAL PROTEIN: 7 g/dL (ref 6.0–8.3)

## 2013-12-02 LAB — HEMOGLOBIN A1C: Hgb A1c MFr Bld: 7.3 % — ABNORMAL HIGH (ref 4.6–6.5)

## 2013-12-02 NOTE — Telephone Encounter (Signed)
New message  Pt request a call back// No details please call

## 2013-12-02 NOTE — Telephone Encounter (Signed)
**Note De-identified [name] Obfuscation** LMTCB

## 2013-12-03 ENCOUNTER — Other Ambulatory Visit: Payer: Self-pay | Admitting: *Deleted

## 2013-12-03 ENCOUNTER — Ambulatory Visit (INDEPENDENT_AMBULATORY_CARE_PROVIDER_SITE_OTHER): Payer: Medicare Other

## 2013-12-03 DIAGNOSIS — Z23 Encounter for immunization: Secondary | ICD-10-CM

## 2013-12-03 MED ORDER — COLCHICINE 0.6 MG PO TABS: 0.6000 mg | ORAL_TABLET | ORAL | Status: DC | PRN

## 2013-12-04 ENCOUNTER — Telehealth: Payer: Self-pay

## 2013-12-04 ENCOUNTER — Other Ambulatory Visit: Payer: Self-pay

## 2013-12-04 MED ORDER — WARFARIN SODIUM 2.5 MG PO TABS: ORAL_TABLET | ORAL | Status: DC

## 2013-12-04 NOTE — Telephone Encounter (Signed)
LMTCB

## 2013-12-04 NOTE — Telephone Encounter (Signed)
The pts wife, Lenus, called to give me the pts weights for the past couple of weeks. Weights have been recorded.

## 2013-12-04 NOTE — Telephone Encounter (Signed)
Follow Up

## 2013-12-04 NOTE — Telephone Encounter (Addendum)
**Note De-Identified Caitlen Worth Obfuscation** 11/19/13  Weight= 243 lbs.  The pt's wife, Demetruis, states that the pt continues to have right leg edema and no sob at this time. 11/20/13  Weight= 243 lbs.   The pt's wife, Edger, states that the pt continues to have right leg edema and no sob at this time. 11/21/13  Weight= 244 lbs.   The pt's wife, Manson, states that the pt continues to have right leg edema and no sob at this time. The pt took a Chlorthalidone dose today due to weight greater than 242 lbs. 11/22/13  Weight= 243 lbs.   The pt's wife, Nissan, states that the pt continues to have right leg edema and no sob at this time. 11/23/13  Weight= 243 lbs. The pt's wife, Randon, states that the pt continues to have right leg edema and no sob at this time. 11/24/13  Weight= 243 lbs.  The pt's wife, Stefanos, states that the pt continues to have right leg edema and no sob at this time. 11/25/13  Weight= 248 lbs.  The pt's wife, Jermari, states that the pt continues to have right leg edema and no sob at this time. The pt took a Chlorthalidone dose today due to weight greater than 242 lbs. 11/26/13  Weight= 246 lbs.  The pt's wife, Compton, states that the pt continues to have right leg edema and no sob at this time. The pt took a Chlorthalidone dose today due to weight greater than 242 lbs. 11/27/13  Weight= 245 lbs. The pt's wife, Harald, states that the pt continues to have right leg edema and no sob at this time. The pt took a Chlorthalidone dose today due to weight greater than 242 lbs. 11/28/13  Weight= 244 lbs. The pt's wife, Alper, states that the pt continues to have right leg edema and no sob at this time. The pt took a Chlorthalidone dose today due to weight greater than 242 lbs. 11/30/13  Weight= 242 lbs.  The pt's wife, Latron, states that the pt continues to have right leg edema and no sob at this time. 12/01/13  Weight= 243 lbs.  The pt's wife, Saquan, states that the pt continues to have right leg edema and no sob at this time. 12/02/13  Weight=  241 lbs.   The pt's wife, Mahir, states that the pt continues to have right leg edema and no sob at this time. 12/03/13  Weight=245 lbs.   The pt's wife, Leondre, states that the pt continues to have right leg edema and no sob at this time. The pt took a Chlorthalidone dose today due to weight greater than 242 lbs. 12/04/13  Weight= 243 lbs.  The pt's wife, Brion, states that the pt continues to have right leg edema and no sob at this time.  12/06/13  Weight=

## 2013-12-05 ENCOUNTER — Ambulatory Visit (INDEPENDENT_AMBULATORY_CARE_PROVIDER_SITE_OTHER): Payer: Medicare Other | Admitting: Endocrinology

## 2013-12-05 ENCOUNTER — Encounter: Payer: Self-pay | Admitting: Endocrinology

## 2013-12-05 ENCOUNTER — Other Ambulatory Visit: Payer: Self-pay | Admitting: *Deleted

## 2013-12-05 VITALS — BP 130/77 | HR 61 | Temp 98.6°F | Resp 16 | Ht 72.0 in | Wt 251.4 lb

## 2013-12-05 DIAGNOSIS — E876 Hypokalemia: Secondary | ICD-10-CM

## 2013-12-05 DIAGNOSIS — IMO0001 Reserved for inherently not codable concepts without codable children: Secondary | ICD-10-CM

## 2013-12-05 DIAGNOSIS — E1165 Type 2 diabetes mellitus with hyperglycemia: Principal | ICD-10-CM

## 2013-12-05 DIAGNOSIS — E1142 Type 2 diabetes mellitus with diabetic polyneuropathy: Secondary | ICD-10-CM

## 2013-12-05 MED ORDER — INSULIN LISPRO 100 UNIT/ML (KWIKPEN): 8.0000 [IU] | PEN_INJECTOR | Freq: Two times a day (BID) | SUBCUTANEOUS | Status: DC

## 2013-12-05 NOTE — Patient Instructions (Addendum)
Levemir 35 units  Reduce high fat foods  Extra Gabapentin at bedtime

## 2013-12-05 NOTE — Progress Notes (Signed)
Patient ID: [name], male   DOB: [birth year], 73 y.o.   MRN: [id number]    Reason for Appointment : F/u for Type 2 Diabetes  History of Present Illness          Diagnosis: Type 2 diabetes mellitus, date of diagnosis: 2008       Past history: He was initially treated with Metformin and also given Amayl at some point. With this he had fair control of his diabetes with A1c had usually been over 7% except once in 2013.  He was taken off metformin probably in March this year after a hospitalization, possibly because of renal dysfunction He was started on Levemir insulin in May when his blood sugars were significantly higher, fasting readings averaging 220.  Levemir progressively increased and was on 35 units at night since 12/20/12 He has not been on any other medications either orally or injectable for his diabetes  Recent history:    Because of his A1c of over 10% in 10/14 on basal insulin alone he was started on NovoLog with each meal in 10/14 Has increased his NovoLog from a starting dose of 6 units to 12 units and postprandial readings have been generally well controlled A1c has been below 8% and on this visit relatively better at 7.3 He has been inconsistent in taking his blood sugars at various times including after meals Does not usually breakfast He has limited ability to lose weight partly because of difficulty with exercise and limited use of drugs because of renal dysfunction He does have relatively high fat meals occasionally which will cause his POSTPRANDIAL blood sugars to be occasionally over 200  INSULIN regimen is described as:  33 Levemir hs NovoLog 12 units ac  Glucose monitoring:  done 3 times a day        Glucometer:     Blood Glucose readings from his diary:  PREMEAL Breakfast PC Lunch PC Dinner Bedtime Overall  Glucose range: 130-162 109-252 134-219    Mean/median:  137   153   160   150   Hypoglycemia frequency:  none.           Self-care: The diet that  the patient has been following is: Smaller portions and relatively balanced meals  Meals: 2 meals per day. Noon and 6 pm  Low fat, trying to eat more salads. Avoiding drinks with sugar and no juices       Physical activity: exercise: Minimal, limited by back pain           Dietician visit: Most recent: Unknown.  CDE visit: 11/14               Compliance with the medical regimen: Good  Retinal exam: Most recent: 6/14, reportedly normal  Weight control: This appears to be fluctuating significantly  Wt Readings from Last 3 Encounters:  12/05/13 251 lb 6.4 oz (114.034 kg)  09/04/13 252 lb (114.306 kg)  08/30/13 250 lb 12.8 oz (113.762 kg)    Lab Results  Component Value Date   HGBA1C 7.3* 12/02/2013   HGBA1C 7.7* 07/02/2013   HGBA1C 7.3* 03/28/2013   Lab Results  Component Value Date   MICROALBUR 0.5 02/01/2013   LDLCALC 96 07/02/2013   CREATININE 1.6* 12/02/2013       Medication List       This list is accurate as of: 12/05/13  2:00 PM.  Always use your most recent med list.  ACCU-CHEK FASTCLIX LANCETS Misc  Use to check blood sugars 3 times per day     allopurinol 100 MG tablet  Commonly known as:  ZYLOPRIM  Take 1 tablet (100 mg total) by mouth daily.     amLODipine 5 MG tablet  Commonly known as:  NORVASC  Take 0.5 tablets (2.5 mg total) by mouth daily.     aspirin 81 MG tablet  Take 81 mg by mouth daily.     atorvastatin 10 MG tablet  Commonly known as:  LIPITOR  Take 1 tablet (10 mg total) by mouth daily.     carvedilol 6.25 MG tablet  Commonly known as:  COREG  Take 1 tablet (6.25 mg total) by mouth 2 (two) times daily.     chlorthalidone 25 MG tablet  Commonly known as:  HYGROTON  Take 0.5 tablets (12.5 mg total) by mouth daily. Take when weight reaches 242 lbs. or greater only     Cholecalciferol 10000 UNITS Caps  Take 1,000 Units by mouth daily.     colchicine 0.6 MG tablet  Commonly known as:  COLCRYS  Take 1 tablet (0.6 mg total) by  mouth as needed (for gout).     furosemide 40 MG tablet  Commonly known as:  LASIX  Take 2 tablets (80 mg total) by mouth 2 (two) times daily.     gabapentin 100 MG capsule  Commonly known as:  NEURONTIN  Take 100 mg by mouth 2 (two) times daily.     glucose blood test strip  Commonly known as:  ACCU-CHEK SMARTVIEW  Use as instructed to check blood sugars 3 times per day dx code 250.72     Insulin Detemir 100 UNIT/ML Pen  Commonly known as:  LEVEMIR FLEXPEN  Inject 35 Units into the skin at bedtime.     ipratropium 0.06 % nasal spray  Commonly known as:  ATROVENT  Place 1 spray into both nostrils as directed.     metFORMIN 750 MG 24 hr tablet  Commonly known as:  GLUCOPHAGE XR  Take 1 tablet (750 mg total) by mouth daily with breakfast.     naphazoline-pheniramine 0.025-0.3 % ophthalmic solution  Commonly known as:  NAPHCON-A  Place 1 drop into both eyes daily as needed for irritation.     nitroGLYCERIN 0.4 MG SL tablet  Commonly known as:  NITROSTAT  Place 0.4 mg under the tongue every 5 (five) minutes as needed for chest pain.     NOVOLOG FLEXPEN 100 UNIT/ML FlexPen  Generic drug:  insulin aspart  Inject 8 Units into the skin 2 (two) times daily.     potassium chloride SA 20 MEQ tablet  Commonly known as:  KLOR-CON M20  Take 1 tablet (20 mEq total) by mouth 3 (three) times daily.     warfarin 2.5 MG tablet  Commonly known as:  COUMADIN  TAKE AS DIRECTED BY ANTICOAGULATION CLINIC        Allergies:  Allergies  Allergen Reactions  . Avelox [Moxifloxacin Hcl In Nacl] Swelling, Rash and Other (See Comments)    Patient became hypotensive after infusion started Because of a history of documented adverse serious drug reaction;Medi Alert bracelet  is recommended  . Penicillins     REACTION: anaphylaxis Because of a history of documented adverse serious drug reaction;Medi Alert bracelet  is recommended    Past Medical History  Diagnosis Date  . Diabetes mellitus    . Hyperlipidemia   . Hypertension   . Pilonidal cyst   .  Atrial fibrillation     Previous long-term amiodarone therapy with multiple cardioversions / amiodarone stopped September, 2009  . Atrial flutter     Started November, 2010, Left-sided and cannot ablate  . Left atrial thrombus     Remote past... cardioversions done since that time  . Wide-complex tachycardia   . Left ventricular ejection fraction less than 40%   . Gout   . AAA (abdominal aortic aneurysm)     Surgical repair  . Discolored skin   . S/P ICD (internal cardiac defibrillator) procedure     Dr. Lovena Le 2009... by the pacing  . SOB (shortness of breath)     Large left effusion/ thoracentesis/hospitalization/November, 2011... exudated.. cytology negative.. Dr.Wert.. no proof of mesothelioma  . Pericardial effusion   . Pleural effusion     Large loculated effusion on the left side November, 2011. This was tapped. It was exudative. Cytology revealed no cancer no proof of mesothelioma area pulmonary team felt that no further workup was needed  . S/P AAA repair   . Spinal stenosis     Surgery Dr.Elsner  . CAD (coronary artery disease)     Catheterization July, 2008... name and vein grafts patent but low cardiac output  . Warfarin anticoagulation   . Cardiomyopathy     Ischemic... ICD  . Combined systolic and diastolic CHF   . Venous insufficiency     Toe discoloration chronic  . Mitral regurgitation     Mild echo  . Aortic valve sclerosis   . Nasal drainage     Chronic  . Alcohol ingestion of more than four drinks per week     Excess beer  not a dependency problem  . Chronotropic incompetence     IV pacing rate adjusted  . Thrombophlebitis of superficial veins of upper extremities     Possible venous stenosis from defibrillator  . Pericardial effusion     November, 2011 .. decreased during hospitalization  . Eye abnormality     Ophthalmologist questions a clot in one of his eyes, May, 2012  .  Overweight(278.02)     November, 2012  . Pleural thickening   . Ejection fraction     Ejection fraction has varied over time from 35-50%.,, Echoes are technically very difficult,,, EF 50%, echo, May 25, 2011, technically very difficult  . Drug therapy     Redness and swelling with Avelox infusion May 24, 2011  . COPD (chronic obstructive pulmonary disease)   . Myocardial infarction   . Spinal cord stimulator status     October, 2013  . Carotid artery disease     Doppler, December, 2013, 0-39% bilateral  . Pneumonia   . Arthritis   . Ventral hernia     April, 2014, result of his abdominal surgery  . Bony abnormality     Patient's manubrium is slightly displaced to the right    Past Surgical History  Procedure Laterality Date  . Colonoscopy w/ polypectomy    . Lumbar fusion    . Pilonidal cyst removal    . Surgery scrotal / testicular    . Coronary artery bypass graft  2004  . Abdominal aortic aneurysm repair  11/2002  . Bi-ventricular pacemaker insertion (crt-p)  02-11-2013    Pt with previously implanted MDT CRTD downgraded to CRTP by Dr Lovena Le 02-11-13  . Back surgery    . Incision and drainage abscess / hematoma of bursa / knee / thigh      Family History  Problem  Relation Age of Onset  . Hypertension Mother   . Stroke Mother   . Diabetes Father   . Coronary artery disease Father   . Other Father     DVT    Social History:  reports that he quit smoking about 18 years ago. He has quit using smokeless tobacco. He reports that he drinks about 1.2 ounces of alcohol per week. He reports that he does not use illicit drugs.    Review of Systems       Lipids: Treated with Lipitor, currently only taking 10 mg Hyperlipidemia: LDL slightly higher than before although HDL 45 and triglycerides below 200, he is being managed by PCP/cardiologist and probably should be on high-dose statin anyway  Lab Results  Component Value Date   CHOL 173 07/02/2013   HDL 45.00 07/02/2013    LDLCALC 96 07/02/2013   TRIG 162.0* 07/02/2013   CHOLHDL 4 07/02/2013       The blood pressure has been under control with current regimen of chlorthalidone, amlodipine and losartan, BP at home usually normal Apparently he takes extra chlorthalidone when he has weight gain    Has known neuropathy:    He has had pains and tingling in his feet and lower legs. Mostly having some symptoms at bedtime and also in the morning the tops of his feet hurt when he starts walking it taking gabapentin only twice a day  HYPOKALEMIA: Still present, He is already on 20 mg twice a day of potassium but is also on furosemide on and off and chlorthalidone. No muscle cramps   LABS:  Appointment on 12/02/2013  Component Date Value Ref Range Status  . Hemoglobin A1C 12/02/2013 7.3* 4.6 - 6.5 % Final   Glycemic Control Guidelines for People with Diabetes:Non Diabetic:  <6%Goal of Therapy: <7%Additional Action Suggested:  >8%   . Sodium 12/02/2013 140  135 - 145 mEq/L Final  . Potassium 12/02/2013 3.4* 3.5 - 5.1 mEq/L Final  . Chloride 12/02/2013 101  96 - 112 mEq/L Final  . CO2 12/02/2013 24  19 - 32 mEq/L Final  . Glucose, Bld 12/02/2013 174* 70 - 99 mg/dL Final  . BUN 12/02/2013 26* 6 - 23 mg/dL Final  . Creatinine, Ser 12/02/2013 1.6* 0.4 - 1.5 mg/dL Final  . Total Bilirubin 12/02/2013 1.1  0.2 - 1.2 mg/dL Final  . Alkaline Phosphatase 12/02/2013 56  39 - 117 U/L Final  . AST 12/02/2013 20  0 - 37 U/L Final  . ALT 12/02/2013 18  0 - 53 U/L Final  . Total Protein 12/02/2013 7.0  6.0 - 8.3 g/dL Final  . Albumin 12/02/2013 3.7  3.5 - 5.2 g/dL Final  . Calcium 12/02/2013 9.3  8.4 - 10.5 mg/dL Final  . GFR 12/02/2013 44.03* >60.00 mL/min Final    Physical Examination:  BP 130/77  Pulse 61  Temp(Src) 98.6 F (37 C)  Resp 16  Ht 6' (1.829 m)  Wt 251 lb 6.4 oz (114.034 kg)  BMI 34.09 kg/m2  SpO2 97%  No ankle edema    ASSESSMENT:  Diabetes type 2  His blood sugar control is reasonably good  although A1c not below 7% He has some post prandial hyperglycemia and he is not adjusting his insulin based on his meal intake He tends to have high readings with relatively higher fat meals and not aware of needing more insulin for these He is checking blood sugars fairly consistently As discussed in history of present illness he has relatively  inconsistent levels of glucose but more recently his fasting readings have been higher Unable to exercise or lose weight  Although the last A1c of 7.3% is relatively high this is reasonably good for him with his multiple medical problems and age   Hypokalemia: This is secondary to diuretics and will forward his labs to PCP  Mild CKD: Creatinine stable at 1.6  NEUROPATHY: He is still having symptoms but is taking only small doses of Neurontin at this time  PLAN:    Increase Levemir by 2 units  Adjust her mealtime dose based on meal size, fat intake and carbohydrates  Avoid high fat meals with history of dyslipidemia  Continue low dose metformin ER  Call if blood sugars not consistently controlled  May take additional 1-2 tablets of Neurontin and evenings at bedtime to help with neuropathic symptoms  Consider followup consultation with nutritionist  Counseling time over 50% of today's 25 minute visit   Mickaela Starlin 12/05/2013, 2:00 PM

## 2013-12-06 ENCOUNTER — Telehealth: Payer: Self-pay | Admitting: Endocrinology

## 2013-12-06 NOTE — Telephone Encounter (Signed)
Patient would like for you to call them back    Thank you

## 2013-12-11 ENCOUNTER — Ambulatory Visit (INDEPENDENT_AMBULATORY_CARE_PROVIDER_SITE_OTHER): Payer: Medicare Other | Admitting: *Deleted

## 2013-12-11 ENCOUNTER — Encounter: Payer: Self-pay | Admitting: Internal Medicine

## 2013-12-11 DIAGNOSIS — I428 Other cardiomyopathies: Secondary | ICD-10-CM

## 2013-12-11 DIAGNOSIS — I509 Heart failure, unspecified: Secondary | ICD-10-CM

## 2013-12-11 DIAGNOSIS — I429 Cardiomyopathy, unspecified: Secondary | ICD-10-CM

## 2013-12-11 NOTE — Progress Notes (Signed)
Remote ICD transmission.   

## 2013-12-13 LAB — MDC_IDC_ENUM_SESS_TYPE_REMOTE
Brady Statistic AP VP Percent: 0 %
Brady Statistic AP VS Percent: 0 %
Brady Statistic AS VP Percent: 11.16 %
Brady Statistic AS VS Percent: 88.84 %
Brady Statistic RA Percent Paced: 0 %
Brady Statistic RV Percent Paced: 15.49 %
HighPow Impedance: 190 Ohm
HighPow Impedance: 75 Ohm
Lead Channel Impedance Value: 4047 Ohm
Lead Channel Impedance Value: 4047 Ohm
Lead Channel Impedance Value: 456 Ohm
Lead Channel Impedance Value: 456 Ohm
Lead Channel Pacing Threshold Amplitude: 0.875 V
Lead Channel Pacing Threshold Pulse Width: 0.4 ms
Lead Channel Sensing Intrinsic Amplitude: 0.75 mV
Lead Channel Setting Pacing Amplitude: 2.5 V
Lead Channel Setting Pacing Pulse Width: 0.4 ms
MDC IDC MSMT BATTERY REMAINING LONGEVITY: 122 mo
MDC IDC MSMT BATTERY VOLTAGE: 3 V
MDC IDC MSMT LEADCHNL LV IMPEDANCE VALUE: 494 Ohm
MDC IDC MSMT LEADCHNL RA SENSING INTR AMPL: 0.75 mV
MDC IDC MSMT LEADCHNL RV SENSING INTR AMPL: 14.25 mV
MDC IDC MSMT LEADCHNL RV SENSING INTR AMPL: 14.25 mV
MDC IDC SESS DTM: 20150923144018
MDC IDC SET LEADCHNL RV SENSING SENSITIVITY: 0.3 mV
MDC IDC SET ZONE DETECTION INTERVAL: 300 ms
MDC IDC SET ZONE DETECTION INTERVAL: 340 ms
Zone Setting Detection Interval: 350 ms
Zone Setting Detection Interval: 450 ms

## 2013-12-23 ENCOUNTER — Encounter: Payer: Self-pay | Admitting: Cardiology

## 2013-12-23 NOTE — Telephone Encounter (Signed)
**Note De-Identified Nahun Kronberg Obfuscation** 12/06/13  Weight= 245 lbs. The pts wife states that the edema in the pts right leg is unchanged and that he has no SOB at this time. The pt took a chlorthalidone dose today due to weight greater than 242 lbs. 12/07/13  Weight= 245 lbs.  The pts wife states that the edema in the pts right leg is unchanged and that he has no SOB at this time. The pt took a chlorthalidone dose today due to weight greater than 242 lbs. 12/08/13  Weight= 242 lbs.  The pt continues to have right leg swelling and no SOB at this time. 12/09/13  Weight= 242 lbs.  The pt continues to have right leg swelling and no SOB at this time. 12/11/13  Weight= 245 lbs.  The pts wife states that the edema in the pts right leg is unchanged and that he has no SOB at this time. The pt took a chlorthalidone dose today due to weight greater than 242 lbs. 12/12/13  Weight= 242 lbs.  The pt continues to have right leg swelling and no SOB at this time. 12/13/13  Weight= 244 lbs.   The pts wife states that the edema in the pts right leg is unchanged and that he has no SOB at this time. The pt took a chlorthalidone dose today due to weight greater than 242 lbs. 12/16/13  Weight= 240 lbs.  The pt continues to have right leg swelling and no SOB at this time. 12/17/13  Weight= 240 lbs.  The pt continues to have right leg swelling and no SOB at this time.

## 2013-12-27 ENCOUNTER — Telehealth: Payer: Self-pay | Admitting: Cardiology

## 2013-12-27 ENCOUNTER — Other Ambulatory Visit: Payer: Self-pay

## 2013-12-27 MED ORDER — GABAPENTIN 100 MG PO CAPS: 100.0000 mg | ORAL_CAPSULE | Freq: Three times a day (TID) | ORAL | Status: DC

## 2013-12-27 NOTE — Telephone Encounter (Signed)
Follow up:    Per wife please give her call back per you instruction.

## 2013-12-27 NOTE — Telephone Encounter (Signed)
**Note De-Identified [name] Obfuscation** Pt needs refill on Gabapentin and wants to know if [name] will fill for him Per [name] okay to refill.

## 2013-12-27 NOTE — Telephone Encounter (Signed)
Follow up needs to speak to [facility] today please there out of town.

## 2013-12-30 ENCOUNTER — Telehealth: Payer: Self-pay | Admitting: Endocrinology

## 2013-12-30 NOTE — Telephone Encounter (Signed)
Patients wife would like for [name] to call her back    Thank you

## 2014-01-01 ENCOUNTER — Encounter: Payer: Self-pay | Admitting: Cardiology

## 2014-01-01 ENCOUNTER — Ambulatory Visit (INDEPENDENT_AMBULATORY_CARE_PROVIDER_SITE_OTHER): Payer: Medicare Other | Admitting: Cardiology

## 2014-01-01 ENCOUNTER — Ambulatory Visit (INDEPENDENT_AMBULATORY_CARE_PROVIDER_SITE_OTHER)
Admission: RE | Admit: 2014-01-01 | Discharge: 2014-01-01 | Disposition: A | Discharge status: Home / Self Care | Payer: Medicare Other | Source: Ambulatory Visit | Attending: Cardiology | Admitting: Cardiology

## 2014-01-01 ENCOUNTER — Other Ambulatory Visit: Payer: Self-pay

## 2014-01-01 ENCOUNTER — Other Ambulatory Visit: Payer: Self-pay | Admitting: Endocrinology

## 2014-01-01 ENCOUNTER — Ambulatory Visit (INDEPENDENT_AMBULATORY_CARE_PROVIDER_SITE_OTHER): Payer: Medicare Other | Admitting: Pharmacist

## 2014-01-01 VITALS — BP 138/74 | HR 60 | Ht 72.0 in | Wt 251.1 lb

## 2014-01-01 DIAGNOSIS — Z7901 Long term (current) use of anticoagulants: Secondary | ICD-10-CM

## 2014-01-01 DIAGNOSIS — Z95810 Presence of automatic (implantable) cardiac defibrillator: Secondary | ICD-10-CM

## 2014-01-01 DIAGNOSIS — N183 Chronic kidney disease, stage 3 unspecified: Secondary | ICD-10-CM

## 2014-01-01 DIAGNOSIS — R0989 Other specified symptoms and signs involving the circulatory and respiratory systems: Secondary | ICD-10-CM

## 2014-01-01 DIAGNOSIS — R0602 Shortness of breath: Secondary | ICD-10-CM

## 2014-01-01 DIAGNOSIS — I5042 Chronic combined systolic (congestive) and diastolic (congestive) heart failure: Secondary | ICD-10-CM

## 2014-01-01 DIAGNOSIS — I25810 Atherosclerosis of coronary artery bypass graft(s) without angina pectoris: Secondary | ICD-10-CM

## 2014-01-01 DIAGNOSIS — I10 Essential (primary) hypertension: Secondary | ICD-10-CM

## 2014-01-01 DIAGNOSIS — I4891 Unspecified atrial fibrillation: Secondary | ICD-10-CM

## 2014-01-01 DIAGNOSIS — R9430 Abnormal result of cardiovascular function study, unspecified: Secondary | ICD-10-CM

## 2014-01-01 LAB — CBC WITH DIFFERENTIAL/PLATELET
Basophils Absolute: 0.1 10*3/uL (ref 0.0–0.1)
Basophils Relative: 0.7 % (ref 0.0–3.0)
Eosinophils Absolute: 0.1 10*3/uL (ref 0.0–0.7)
Eosinophils Relative: 1 % (ref 0.0–5.0)
HEMATOCRIT: 53.6 % — AB (ref 39.0–52.0)
HEMOGLOBIN: 17.5 g/dL — AB (ref 13.0–17.0)
LYMPHS ABS: 1.6 10*3/uL (ref 0.7–4.0)
Lymphocytes Relative: 19.4 % (ref 12.0–46.0)
MCHC: 32.7 g/dL (ref 30.0–36.0)
MCV: 96.2 fl (ref 78.0–100.0)
Monocytes Absolute: 0.7 10*3/uL (ref 0.1–1.0)
Monocytes Relative: 9.2 % (ref 3.0–12.0)
NEUTROS ABS: 5.6 10*3/uL (ref 1.4–7.7)
Neutrophils Relative %: 69.7 % (ref 43.0–77.0)
Platelets: 174 10*3/uL (ref 150.0–400.0)
RBC: 5.57 Mil/uL (ref 4.22–5.81)
RDW: 15 % (ref 11.5–15.5)
WBC: 8.1 10*3/uL (ref 4.0–10.5)

## 2014-01-01 LAB — BASIC METABOLIC PANEL
BUN: 30 mg/dL — ABNORMAL HIGH (ref 6–23)
CALCIUM: 9.1 mg/dL (ref 8.4–10.5)
CHLORIDE: 102 meq/L (ref 96–112)
CO2: 27 mEq/L (ref 19–32)
Creatinine, Ser: 1.8 mg/dL — ABNORMAL HIGH (ref 0.4–1.5)
GFR: 38.79 mL/min — ABNORMAL LOW (ref >60.00)
GLUCOSE: 193 mg/dL — AB (ref 70–99)
Potassium: 3.6 mEq/L (ref 3.5–5.1)
Sodium: 139 mEq/L (ref 135–145)

## 2014-01-01 LAB — POCT INR: INR: 2

## 2014-01-01 MED ORDER — AMLODIPINE BESYLATE 5 MG PO TABS: ORAL_TABLET | ORAL | Status: DC

## 2014-01-01 MED ORDER — FUROSEMIDE 40 MG PO TABS: 80.0000 mg | ORAL_TABLET | Freq: Two times a day (BID) | ORAL | Status: DC

## 2014-01-01 NOTE — Assessment & Plan Note (Signed)
Patient has renal dysfunction. Be met will be checked today to be sure there has not been any significant change.

## 2014-01-01 NOTE — Assessment & Plan Note (Signed)
Anticoagulation continues. No change in therapy.

## 2014-01-01 NOTE — Patient Instructions (Signed)
**Note De-Identified [name] Obfuscation** Your physician recommends that you continue on your current medications as directed. Please refer to the Current Medication list given to you today. If your blood pressure is greater than 140/80 (on either top or bottom number) take Amlodipine 2.5 mg that evening.   A chest x-ray takes a picture of the organs and structures inside the chest, including the heart, lungs, and blood vessels. This test can show several things, including, whether the heart is enlarges; whether fluid is building up in the lungs; and whether pacemaker / defibrillator leads are still in place.  Your physician recommends that you return for lab work in: today ([place] and BM[facility])  Your physician recommends that you schedule a follow-up appointment in: 1 week to 10 days

## 2014-01-01 NOTE — Assessment & Plan Note (Signed)
His last cath was 2008. Graft are patent but he had low cardiac output. He's not having chest pain at this time. We may have to consider followup nuclear stress evaluation, but not at this time.

## 2014-01-01 NOTE — Assessment & Plan Note (Signed)
The fluid assessment from his ICD today looks good. He does not appear to have significant volume on physical exam.

## 2014-01-01 NOTE — Addendum Note (Signed)
Addended by: Dennie Fetters on: 01/01/2014 05:21 PM   Modules accepted: Orders

## 2014-01-01 NOTE — Assessment & Plan Note (Signed)
We have adjusted his blood pressure medicines over time because of variation in his pressure. Most recently in the past 1-2 weeks his pressure has been elevated. We will arrange for him to take higher doses of medicines when his pressure is ranging higher.

## 2014-01-01 NOTE — Assessment & Plan Note (Signed)
Historically his echoes are technically difficult. EF was 40-45% in November, 2014. I will consider followup echo if I can be sure that contrast will be used in the near future.

## 2014-01-01 NOTE — Assessment & Plan Note (Addendum)
Etiology or shortness of breath today is not clear. He does not appear to have definite volume overload. His symptoms are not necessarily exertional or positional. I've chosen not to change his medications other than adjusting his blood pressure medicines slightly. He'll have a chest x-ray. Renal function will be checked. He'll then see him back for followup. His O2 sat is 97%.  As part of today's evaluation I spent greater than 25 minutes with his total care. More than half of this time has been with the patient and his wife reviewing all aspects of his care.

## 2014-01-01 NOTE — Assessment & Plan Note (Signed)
His unit is working well. He was interrogated today. He is in atrial fib all of the time. His underlying rate is set at 50 and he is pacing only 16% of the time. No change was made in his parameters.

## 2014-01-01 NOTE — Progress Notes (Signed)
Patient ID: [name], male   DOB: 09/15/1940, 73 y.o.   MRN: [id number]    HPI  Patient is seen today for followup coronary disease in congestive heart failure. He follows his weight very carefully daily. He takes chlorthalidone headed to his other medicines if his weight is above 243 pounds. Recently he was at the beach. He did not develop any significant edema. He had slight variation in his weight and took chlorthalidone on appropriate days. However with this he has been feeling more shortness of breath. He has not had PND or orthopnea. A sensation of shortness of breath 10:30 while sitting still or up and around. Allergies  Allergen Reactions  . Avelox [Moxifloxacin Hcl In Nacl] Swelling, Rash and Other (See Comments)    Patient became hypotensive after infusion started Because of a history of documented adverse serious drug reaction;Medi Alert bracelet  is recommended  . Penicillins Anaphylaxis    REACTION: anaphylaxis Because of a history of documented adverse serious drug reaction;Medi Alert bracelet  is recommended    Current Outpatient Prescriptions  Medication Sig Dispense Refill  . ACCU-CHEK FASTCLIX LANCETS MISC Use to check blood sugars 3 times per day  100 each  5  . allopurinol (ZYLOPRIM) 100 MG tablet Take 1 tablet (100 mg total) by mouth daily.  90 tablet  3  . amLODipine (NORVASC) 5 MG tablet Take 0.5 tablets (2.5 mg total) by mouth daily.  90 tablet  1  . aspirin 81 MG tablet Take 81 mg by mouth daily.      Marland Kitchen atorvastatin (LIPITOR) 10 MG tablet Take 1 tablet (10 mg total) by mouth daily.  90 tablet  3  . carvedilol (COREG) 6.25 MG tablet Take 1 tablet (6.25 mg total) by mouth 2 (two) times daily.  180 tablet  1  . chlorthalidone (HYGROTON) 25 MG tablet Take 0.5 tablets (12.5 mg total) by mouth daily. Take when weight reaches 242 lbs. or greater only  90 tablet  3  . Cholecalciferol 10000 UNITS CAPS Take 1,000 Units by mouth daily.      . colchicine (COLCRYS) 0.6 MG  tablet Take 1 tablet (0.6 mg total) by mouth as needed (for gout).  90 tablet  0  . furosemide (LASIX) 40 MG tablet Take 2 tablets (80 mg total) by mouth 2 (two) times daily.  120 tablet  0  . gabapentin (NEURONTIN) 100 MG capsule Take 1 capsule (100 mg total) by mouth 3 (three) times daily.  270 capsule  0  . glucose blood (ACCU-CHEK SMARTVIEW) test strip Use as instructed to check blood sugars 3 times per day dx code 250.72  100 each  5  . Insulin Detemir (LEVEMIR FLEXPEN) 100 UNIT/ML Pen Inject 35 Units into the skin at bedtime.  15 mL  3  . ipratropium (ATROVENT) 0.06 % nasal spray Place 1 spray into both nostrils as directed.       . metFORMIN (GLUCOPHAGE-XR) 750 MG 24 hr tablet TAKE ONE TABLET BY MOUTH ONCE DAILY WITH BREAKFAST  30 tablet  5  . naphazoline-pheniramine (NAPHCON-A) 0.025-0.3 % ophthalmic solution Place 1 drop into both eyes daily as needed for irritation.      . nitroGLYCERIN (NITROSTAT) 0.4 MG SL tablet Place 0.4 mg under the tongue every 5 (five) minutes as needed for chest pain (MAX 3 TABLETS).       . NOVOLOG FLEXPEN 100 UNIT/ML SOPN FlexPen Inject 12-14 Units into the skin 2 (two) times daily.       Marland Kitchen  potassium chloride SA (KLOR-CON M20) 20 MEQ tablet Take 1 tablet (20 mEq total) by mouth 3 (three) times daily.  270 tablet  2  . warfarin (COUMADIN) 2.5 MG tablet TAKE AS DIRECTED BY ANTICOAGULATION CLINIC  120 tablet  0   No current facility-administered medications for this visit.    History   Social History  . Marital Status: Married    Spouse Name: N/A    Number of Children: N/A  . Years of Education: N/A   Occupational History  . Not on file.   Social History Main Topics  . Smoking status: Former Smoker    Quit date: 03/22/1995  . Smokeless tobacco: Former Systems developer  . Alcohol Use: 1.2 oz/week    2 Cans of beer per week     Comment: beer  . Drug Use: No  . Sexual Activity: No   Other Topics Concern  . Not on file   Social History Narrative  . No  narrative on file    Family History  Problem Relation Age of Onset  . Hypertension Mother   . Stroke Mother   . Diabetes Father   . Coronary artery disease Father   . Other Father     DVT    Past Medical History  Diagnosis Date  . Diabetes mellitus   . Hyperlipidemia   . Hypertension   . Pilonidal cyst   . Atrial fibrillation     Previous long-term amiodarone therapy with multiple cardioversions / amiodarone stopped September, 2009  . Atrial flutter     Started November, 2010, Left-sided and cannot ablate  . Left atrial thrombus     Remote past... cardioversions done since that time  . Wide-complex tachycardia   . Left ventricular ejection fraction less than 40%   . Gout   . AAA (abdominal aortic aneurysm)     Surgical repair  . Discolored skin   . S/P ICD (internal cardiac defibrillator) procedure     Dr. Lovena Le 2009... by the pacing  . SOB (shortness of breath)     Large left effusion/ thoracentesis/hospitalization/November, 2011... exudated.. cytology negative.. Dr.Wert.. no proof of mesothelioma  . Pericardial effusion   . Pleural effusion     Large loculated effusion on the left side November, 2011. This was tapped. It was exudative. Cytology revealed no cancer no proof of mesothelioma area pulmonary team felt that no further workup was needed  . S/P AAA repair   . Spinal stenosis     Surgery Dr.Elsner  . CAD (coronary artery disease)     Catheterization July, 2008... name and vein grafts patent but low cardiac output  . Warfarin anticoagulation   . Cardiomyopathy     Ischemic... ICD  . Combined systolic and diastolic CHF   . Venous insufficiency     Toe discoloration chronic  . Mitral regurgitation     Mild echo  . Aortic valve sclerosis   . Nasal drainage     Chronic  . Alcohol ingestion of more than four drinks per week     Excess beer  not a dependency problem  . Chronotropic incompetence     IV pacing rate adjusted  . Thrombophlebitis of superficial  veins of upper extremities     Possible venous stenosis from defibrillator  . Pericardial effusion     November, 2011 .. decreased during hospitalization  . Eye abnormality     Ophthalmologist questions a clot in one of his eyes, May, 2012  . Overweight(278.02)  November, 2012  . Pleural thickening   . Ejection fraction     Ejection fraction has varied over time from 35-50%.,, Echoes are technically very difficult,,, EF 50%, echo, May 25, 2011, technically very difficult  . Drug therapy     Redness and swelling with Avelox infusion May 24, 2011  . COPD (chronic obstructive pulmonary disease)   . Myocardial infarction   . Spinal cord stimulator status     October, 2013  . Carotid artery disease     Doppler, December, 2013, 0-39% bilateral  . Pneumonia   . Arthritis   . Ventral hernia     April, 2014, result of his abdominal surgery  . Bony abnormality     Patient's manubrium is slightly displaced to the right    Past Surgical History  Procedure Laterality Date  . Colonoscopy w/ polypectomy    . Lumbar fusion    . Pilonidal cyst removal    . Surgery scrotal / testicular    . Coronary artery bypass graft  2004  . Abdominal aortic aneurysm repair  11/2002  . Bi-ventricular pacemaker insertion (crt-p)  02-11-2013    Pt with previously implanted MDT CRTD downgraded to CRTP by Dr Lovena Le 02-11-13  . Back surgery    . Incision and drainage abscess / hematoma of bursa / knee / thigh      Patient Active Problem List   Diagnosis Date Noted  . Abscess, scrotum 03/05/2013  . Combined systolic and diastolic CHF   . Ejection fraction   . Hypopotassemia 12/20/2012  . ACE-inhibitor cough 10/04/2012  . Ventral hernia   . Bony abnormality   . Cellulitis of scrotum 05/23/2012  . Carotid artery disease   . Spinal cord stimulator status   . 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 Atrial fibrillation   . Spinal stenosis   . CAD (coronary artery disease)   .  Cardiomyopathy   . Venous insufficiency   . Mitral regurgitation   . Aortic valve sclerosis   . Alcohol ingestion of more than four drinks per week   . Chronotropic incompetence   . Thrombophlebitis of superficial veins of upper extremities   . Pericardial effusion   . Overweight   . Pleural thickening   . Pleural effusion   . Drug therapy   . Hyperlipidemia   . Hypertension   . Atrial flutter   . Left atrial thrombus   . AAA (abdominal aortic aneurysm)   . S/P ICD (internal cardiac defibrillator) procedure   . Warfarin anticoagulation   . Polyneuropathy in diabetes(357.2) 01/07/2010  . DIVERTICULOSIS, COLON 06/10/2009  . Uncontrolled diabetes mellitus with peripheral circulatory disorder 03/11/2009  . CKD (chronic kidney disease), stage III 06/19/2008  . BACK PAIN, LUMBAR 06/12/2008  . 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 GOUT 07/27/2006  . DVT 07/27/2006  . CORONARY ARTERY BYPASS GRAFT, HX OF 07/27/2006    ROS   patient denies fever, chills, headache, sweats, rash, change in vision, change in hearing, chest pain, cough, nausea vomiting, urinary symptoms. All other systems are reviewed and are negative.  PHYSICAL EXAM   patient is here with his wife. He is oriented to person time and place. Affect is normal. Head is atraumatic. There is no jugulovenous distention. I do not hear any obvious rales. There is no respiratory distress. Cardiac exam reveals an S1 and S2. The rate is controlled. Abdomen is protuberant as always. There is no significant peripheral edema. There no musculoskeletal  deformities. There are no skin rashes.  Filed Vitals:   01/01/14 1350  BP: 138/74  Pulse: 60  Height: 6' (1.829 m)  Weight: 251 lb 1.9 oz (113.907 kg)    I requested that the patient's ICD be interrogated. .  ASSESSMENT & PLAN

## 2014-01-02 ENCOUNTER — Encounter: Payer: Self-pay | Admitting: Cardiology

## 2014-01-02 NOTE — Progress Notes (Signed)
Patient ID: [name], male   DOB: 03-08-1941, 73 y.o.   MRN: [id number]  Pt seen in office 01/01/2014. Has non-exertional SOB. No obvious volume overload. Labs show increasing Hgb to 17.6. Creatinine up to 1.8. Chest XRay shows no obvious volume overload.   Allow weight to float up to 244 (was 242) before taking chlorthalidone. Repeat CBC / BMet two weeks. ? Does he need evaluation for Increasing Hgb?   [name]

## 2014-01-06 ENCOUNTER — Encounter: Payer: Self-pay | Admitting: Internal Medicine

## 2014-01-08 ENCOUNTER — Telehealth: Payer: Self-pay | Admitting: Endocrinology

## 2014-01-08 NOTE — Telephone Encounter (Signed)
Wife wants you to call didn't say why

## 2014-01-13 ENCOUNTER — Ambulatory Visit (INDEPENDENT_AMBULATORY_CARE_PROVIDER_SITE_OTHER): Payer: Medicare Other | Admitting: Cardiology

## 2014-01-13 ENCOUNTER — Encounter: Payer: Self-pay | Admitting: Cardiology

## 2014-01-13 VITALS — BP 136/74 | HR 53 | Ht 73.0 in | Wt 252.0 lb

## 2014-01-13 DIAGNOSIS — I10 Essential (primary) hypertension: Secondary | ICD-10-CM

## 2014-01-13 DIAGNOSIS — I2510 Atherosclerotic heart disease of native coronary artery without angina pectoris: Secondary | ICD-10-CM

## 2014-01-13 DIAGNOSIS — I3139 Other pericardial effusion (noninflammatory): Secondary | ICD-10-CM

## 2014-01-13 DIAGNOSIS — I5042 Chronic combined systolic (congestive) and diastolic (congestive) heart failure: Secondary | ICD-10-CM

## 2014-01-13 DIAGNOSIS — I4589 Other specified conduction disorders: Secondary | ICD-10-CM

## 2014-01-13 DIAGNOSIS — I313 Pericardial effusion (noninflammatory): Secondary | ICD-10-CM

## 2014-01-13 DIAGNOSIS — D582 Other hemoglobinopathies: Secondary | ICD-10-CM

## 2014-01-13 DIAGNOSIS — I4891 Unspecified atrial fibrillation: Secondary | ICD-10-CM

## 2014-01-13 DIAGNOSIS — R0602 Shortness of breath: Secondary | ICD-10-CM

## 2014-01-13 DIAGNOSIS — J90 Pleural effusion, not elsewhere classified: Secondary | ICD-10-CM

## 2014-01-13 DIAGNOSIS — N183 Chronic kidney disease, stage 3 unspecified: Secondary | ICD-10-CM

## 2014-01-13 DIAGNOSIS — Z77090 Contact with and (suspected) exposure to asbestos: Secondary | ICD-10-CM

## 2014-01-13 DIAGNOSIS — Z7901 Long term (current) use of anticoagulants: Secondary | ICD-10-CM

## 2014-01-13 DIAGNOSIS — I319 Disease of pericardium, unspecified: Secondary | ICD-10-CM

## 2014-01-13 DIAGNOSIS — I340 Nonrheumatic mitral (valve) insufficiency: Secondary | ICD-10-CM

## 2014-01-13 DIAGNOSIS — I429 Cardiomyopathy, unspecified: Secondary | ICD-10-CM

## 2014-01-13 NOTE — Patient Instructions (Signed)
**Note De-Identified Quandra Fedorchak Obfuscation** Your physician recommends that you continue on your current medications as directed. Please refer to the Current Medication list given to you today.  Your physician recommends that you return for lab work in: today  Your physician recommends that you schedule a follow-up appointment in: 2 weeks

## 2014-01-14 ENCOUNTER — Encounter: Payer: Self-pay | Admitting: Cardiology

## 2014-01-14 DIAGNOSIS — N183 Chronic kidney disease, stage 3 unspecified: Secondary | ICD-10-CM | POA: Insufficient documentation

## 2014-01-14 DIAGNOSIS — D582 Other hemoglobinopathies: Secondary | ICD-10-CM | POA: Insufficient documentation

## 2014-01-14 DIAGNOSIS — Z77090 Contact with and (suspected) exposure to asbestos: Secondary | ICD-10-CM | POA: Insufficient documentation

## 2014-01-14 LAB — BASIC METABOLIC PANEL
BUN: 28 mg/dL — ABNORMAL HIGH (ref 6–23)
CO2: 29 mEq/L (ref 19–32)
Calcium: 8.9 mg/dL (ref 8.4–10.5)
Chloride: 101 mEq/L (ref 96–112)
Creatinine, Ser: 1.7 mg/dL — ABNORMAL HIGH (ref 0.4–1.5)
GFR: 41.66 mL/min — ABNORMAL LOW (ref >60.00)
GLUCOSE: 83 mg/dL (ref 70–99)
POTASSIUM: 3.5 meq/L (ref 3.5–5.1)
SODIUM: 138 meq/L (ref 135–145)

## 2014-01-14 LAB — CBC WITH DIFFERENTIAL/PLATELET
Basophils Absolute: 0 10*3/uL (ref 0.0–0.1)
Basophils Relative: 0.5 % (ref 0.0–3.0)
Eosinophils Absolute: 0.1 10*3/uL (ref 0.0–0.7)
Eosinophils Relative: 1.3 % (ref 0.0–5.0)
HCT: 52.9 % — ABNORMAL HIGH (ref 39.0–52.0)
Hemoglobin: 17.4 g/dL — ABNORMAL HIGH (ref 13.0–17.0)
Lymphocytes Relative: 22.4 % (ref 12.0–46.0)
Lymphs Abs: 1.7 10*3/uL (ref 0.7–4.0)
MCHC: 32.9 g/dL (ref 30.0–36.0)
MCV: 95.9 fl (ref 78.0–100.0)
MONOS PCT: 11 % (ref 3.0–12.0)
Monocytes Absolute: 0.8 10*3/uL (ref 0.1–1.0)
Neutro Abs: 4.9 10*3/uL (ref 1.4–7.7)
Neutrophils Relative %: 64.8 % (ref 43.0–77.0)
PLATELETS: 182 10*3/uL (ref 150.0–400.0)
RBC: 5.52 Mil/uL (ref 4.22–5.81)
RDW: 14.4 % (ref 11.5–15.5)
WBC: 7.6 10*3/uL (ref 4.0–10.5)

## 2014-01-14 NOTE — Assessment & Plan Note (Signed)
He has left ventricular dysfunction. Follow-up 2-D echo will be done to reassess his status. I will order this with contrast.

## 2014-01-14 NOTE — Assessment & Plan Note (Signed)
We will reassess LV function. His volume status in my opinion is stable at this time.

## 2014-01-14 NOTE — Assessment & Plan Note (Signed)
Warfarin anticoagulation is to be continued.

## 2014-01-14 NOTE — Assessment & Plan Note (Signed)
He has had mitral regurgitation in the past. This will be reassessed by echo.

## 2014-01-14 NOTE — Assessment & Plan Note (Signed)
At one point his blood pressure was running lower than we wanted. We backed off on his medicines. However more recently I have had to add amlodipine back to his regimen. I do not believe that hypertension is significant enough to be causing his symptoms.

## 2014-01-14 NOTE — Assessment & Plan Note (Addendum)
At this time the exact etiology of his exertional shortness of breath and fatigue is not clear. I have spent a prolonged time reviewing all of his problems which are quite extensive. I am not convinced that fluid overload is the problem. I wonder if he could have low cardiac output with exercise. This could possibly be related to chronotropic incompetence. He may need further adjustment of his pacemaker. He has a biventricular pacer. However his left ventricular lead is turned off. I will need further input from Dr. Lovena Le as to whether or not further adjustments can be made. Two-dimensional echo will be done to reassess LV function. He did have an unexpected pericardial effusion in the past. It seems unlikely that this is the case but this will be ruled out. His renal function is slightly worse. However I do not believe that this is leading to significant volume overload at this time. I'm concerned about the rising hemoglobin. I will look for formal evaluation of this. We'll also arrange for full pulmonary function studies. I we will refer him for formal pulmonary evaluation to help with assessment of pulmonary function studies and to address whether he has evidence of pulmonary asbestosis causing problems. I am not going to change his medicines as of today. It may turn out that a cardiopulmonary exercise test will help [place] with further data at a later date.  As part of this evaluation I have spent in the range of 90 minutes. At least 30 minutes was spent with direct contact with the patient and his wife counseling him and talking about his symptoms and trying to work through how we would approach his issues. This was a long and extensive evaluation.

## 2014-01-14 NOTE — Assessment & Plan Note (Signed)
He has a history of underlying atrial arrhythmias. His rate does not appear to go fast. I do not believe this is playing a significant role with his current symptoms. He is anticoagulated.

## 2014-01-14 NOTE — Assessment & Plan Note (Signed)
It is not clear to me if asbestosis plays a role in his overall status. He will have full pulmonary function studies and a formal pulmonary consult to help answer questions as to whether or not his pulmonary status is playing a role with his exertional shortness of breath.

## 2014-01-14 NOTE — Assessment & Plan Note (Signed)
I am wondering if he has polycythemia. I will request a formal hematology consult.

## 2014-01-14 NOTE — Assessment & Plan Note (Signed)
There have been issues with chronotropic incompetence. Early after his most recent ICD upgrade he needed further adjustment before he felt better. I will ask Dr. Lovena Le to see him to reassess his status and see if there are any thoughts concerning how we can make him feel better.

## 2014-01-14 NOTE — Progress Notes (Signed)
Patient ID: [name], male   DOB: [birth year], 73 y.o.   MRN: [id number]    HPI  The patient is seen today to follow-up his exertional shortness of breath and fatigue. Historically the patient has had periods with volume overload. However most recently it is my opinion that his shortness of breath has not been related to volume overload. When I saw him recently I checked his labs and also ordered a chest film. The chest film does not show CHF. He does have renal insufficiency which is slightly worse in the past. I decided not to push his diuretics. I'm seeing him back in follow-up now. He continues to have exertional fatigue and shortness of breath. He is not having PND or orthopnea. He does not appear to have significant edema. He's not having any chest pain. In the office today he walked in the hall. He did have shortness of breath. His O2 sat duration remained in the range of 96%. Recent labs had shown an increasing hemoglobin. Etiology is not clear to me. It is not due to volume contraction.  Allergies  Allergen Reactions  . Avelox [Moxifloxacin Hcl In Nacl] Swelling, Rash and Other (See Comments)    Patient became hypotensive after infusion started Because of a history of documented adverse serious drug reaction;Medi Alert bracelet  is recommended  . Penicillins Anaphylaxis    REACTION: anaphylaxis Because of a history of documented adverse serious drug reaction;Medi Alert bracelet  is recommended    Current Outpatient Prescriptions  Medication Sig Dispense Refill  . ACCU-CHEK FASTCLIX LANCETS MISC Use to check blood sugars 3 times per day  100 each  5  . allopurinol (ZYLOPRIM) 100 MG tablet Take 1 tablet (100 mg total) by mouth daily.  90 tablet  3  . amLODipine (NORVASC) 5 MG tablet Take 2.5 mg daily. When blood pressure is greater than 140/80 take additional 2.5 mg in the evening.  135 tablet  0  . aspirin 81 MG tablet Take 81 mg by mouth daily.      Marland Kitchen atorvastatin (LIPITOR) 10  MG tablet Take 1 tablet (10 mg total) by mouth daily.  90 tablet  3  . carvedilol (COREG) 6.25 MG tablet Take 1 tablet (6.25 mg total) by mouth 2 (two) times daily.  180 tablet  1  . chlorthalidone (HYGROTON) 25 MG tablet Take 0.5 tablets (12.5 mg total) by mouth daily. Take when weight reaches 242 lbs. or greater only  90 tablet  3  . Cholecalciferol 10000 UNITS CAPS Take 1,000 Units by mouth daily.      . colchicine (COLCRYS) 0.6 MG tablet Take 1 tablet (0.6 mg total) by mouth as needed (for gout).  90 tablet  0  . furosemide (LASIX) 40 MG tablet Take 2 tablets (80 mg total) by mouth 2 (two) times daily.  180 tablet  1  . gabapentin (NEURONTIN) 100 MG capsule Take 1 capsule (100 mg total) by mouth 3 (three) times daily.  270 capsule  0  . glucose blood (ACCU-CHEK SMARTVIEW) test strip Use as instructed to check blood sugars 3 times per day dx code 250.72  100 each  5  . Insulin Detemir (LEVEMIR FLEXPEN) 100 UNIT/ML Pen Inject 35 Units into the skin at bedtime.  15 mL  3  . ipratropium (ATROVENT) 0.06 % nasal spray Place 1 spray into both nostrils as directed.       . metFORMIN (GLUCOPHAGE-XR) 750 MG 24 hr tablet TAKE ONE TABLET BY MOUTH ONCE  DAILY WITH BREAKFAST  30 tablet  5  . naphazoline-pheniramine (NAPHCON-A) 0.025-0.3 % ophthalmic solution Place 1 drop into both eyes daily as needed for irritation.      . nitroGLYCERIN (NITROSTAT) 0.4 MG SL tablet Place 0.4 mg under the tongue every 5 (five) minutes as needed for chest pain (MAX 3 TABLETS).       . NOVOLOG FLEXPEN 100 UNIT/ML SOPN FlexPen Inject 12-14 Units into the skin 2 (two) times daily.       . potassium chloride SA (KLOR-CON M20) 20 MEQ tablet Take 1 tablet (20 mEq total) by mouth 3 (three) times daily.  270 tablet  2  . warfarin (COUMADIN) 2.5 MG tablet TAKE AS DIRECTED BY ANTICOAGULATION CLINIC  120 tablet  0   No current facility-administered medications for this visit.    History   Social History  . Marital Status: Married     Spouse Name: N/A    Number of Children: N/A  . Years of Education: N/A   Occupational History  . Not on file.   Social History Main Topics  . Smoking status: Former Smoker    Quit date: 03/22/1995  . Smokeless tobacco: Former Systems developer  . Alcohol Use: 1.2 oz/week    2 Cans of beer per week     Comment: beer  . Drug Use: No  . Sexual Activity: No   Other Topics Concern  . Not on file   Social History Narrative  . No narrative on file    Family History  Problem Relation Age of Onset  . Hypertension Mother   . Stroke Mother   . Diabetes Father   . Coronary artery disease Father   . Other Father     DVT    Past Medical History  Diagnosis Date  . Diabetes mellitus   . Hyperlipidemia   . Hypertension   . Pilonidal cyst   . Atrial fibrillation     Previous long-term amiodarone therapy with multiple cardioversions / amiodarone stopped September, 2009  . Atrial flutter     Started November, 2010, Left-sided and cannot ablate  . Left atrial thrombus     Remote past... cardioversions done since that time  . Wide-complex tachycardia   . Left ventricular ejection fraction less than 40%   . Gout   . AAA (abdominal aortic aneurysm)     Surgical repair  . Discolored skin   . S/P ICD (internal cardiac defibrillator) procedure     Dr. Lovena Le 2009... by the pacing  . SOB (shortness of breath)     Large left effusion/ thoracentesis/hospitalization/November, 2011... exudated.. cytology negative.. Dr.Wert.. no proof of mesothelioma  . Pericardial effusion   . Pleural effusion     Large loculated effusion on the left side November, 2011. This was tapped. It was exudative. Cytology revealed no cancer no proof of mesothelioma area pulmonary team felt that no further workup was needed  . S/P AAA repair   . Spinal stenosis     Surgery Dr.Elsner  . CAD (coronary artery disease)     Catheterization July, 2008... name and vein grafts patent but low cardiac output  . Warfarin  anticoagulation   . Cardiomyopathy     Ischemic... ICD  . Combined systolic and diastolic CHF   . Venous insufficiency     Toe discoloration chronic  . Mitral regurgitation     Mild echo  . Aortic valve sclerosis   . Nasal drainage     Chronic  . Alcohol  ingestion of more than four drinks per week     Excess beer  not a dependency problem  . Chronotropic incompetence     IV pacing rate adjusted  . Thrombophlebitis of superficial veins of upper extremities     Possible venous stenosis from defibrillator  . Pericardial effusion     November, 2011 .. decreased during hospitalization  . Eye abnormality     Ophthalmologist questions a clot in one of his eyes, May, 2012  . Overweight(278.02)     November, 2012  . Pleural thickening   . Ejection fraction     Ejection fraction has varied over time from 35-50%.,, Echoes are technically very difficult,,, EF 50%, echo, May 25, 2011, technically very difficult  . Drug therapy     Redness and swelling with Avelox infusion May 24, 2011  . COPD (chronic obstructive pulmonary disease)   . Myocardial infarction   . Spinal cord stimulator status     October, 2013  . Carotid artery disease     Doppler, December, 2013, 0-39% bilateral  . Pneumonia   . Arthritis   . Ventral hernia     April, 2014, result of his abdominal surgery  . Bony abnormality     Patient's manubrium is slightly displaced to the right    Past Surgical History  Procedure Laterality Date  . Colonoscopy w/ polypectomy    . Lumbar fusion    . Pilonidal cyst removal    . Surgery scrotal / testicular    . Coronary artery bypass graft  2004  . Abdominal aortic aneurysm repair  11/2002  . Bi-ventricular pacemaker insertion (crt-p)  02-11-2013    Pt with previously implanted MDT CRTD downgraded to CRTP by Dr Lovena Le 02-11-13  . Back surgery    . Incision and drainage abscess / hematoma of bursa / knee / thigh      Patient Active Problem List   Diagnosis Date Noted  .  Elevated hemoglobin 01/14/2014  . CKD (chronic kidney disease) stage 3, GFR 30-59 ml/min 01/14/2014  . Exertional shortness of breath 01/14/2014  . Shortness of breath 01/01/2014  . Abscess, scrotum 03/05/2013  . Combined systolic and diastolic CHF   . Ejection fraction   . Hypopotassemia 12/20/2012  . ACE-inhibitor cough 10/04/2012  . Ventral hernia   . Bony abnormality   . Cellulitis of scrotum 05/23/2012  . Carotid artery disease   . Spinal cord stimulator status   . 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 Atrial fibrillation   . Spinal stenosis   . CAD (coronary artery disease)   . Cardiomyopathy   . Venous insufficiency   . Mitral regurgitation   . Aortic valve sclerosis   . Alcohol ingestion of more than four drinks per week   . Chronotropic incompetence   . Thrombophlebitis of superficial veins of upper extremities   . Pericardial effusion   . Overweight   . Pleural thickening   . Pleural effusion   . Drug therapy   . Hyperlipidemia   . Hypertension   . Atrial flutter   . Left atrial thrombus   . AAA (abdominal aortic aneurysm)   . S/P ICD (internal cardiac defibrillator) procedure   . Warfarin anticoagulation   . Polyneuropathy in diabetes(357.2) 01/07/2010  . DIVERTICULOSIS, COLON 06/10/2009  . Uncontrolled diabetes mellitus with peripheral circulatory disorder 03/11/2009  . CKD (chronic kidney disease), stage III 06/19/2008  . BACK PAIN, LUMBAR 06/12/2008  . 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 GOUT 07/27/2006  . DVT 07/27/2006  . CORONARY ARTERY BYPASS GRAFT, HX OF 07/27/2006    ROS   Patient denies fever, chills, headache, sweats, rash, change in vision, change in hearing, chest pain, cough, nausea or vomiting, urinary symptoms. All other systems are reviewed and are negative.  PHYSICAL EXAM   The patient is stable today. He's having one of his better days. He is here with his wife. He is oriented to person time  and place. Affect is normal. Head is atraumatic. Sclera and conjunctiva are normal. There is no jugular venous distention. Lungs are clear. Respiratory effort is nonlabored. Cardiac exam reveals an S1 and S2. Abdomen is protuberant, but unchanged from the past. There is no significant peripheral edema. There are no musculoskeletal deformities. There are no skin rashes.  Filed Vitals:   01/13/14 1609  BP: 136/74  Pulse: 53  Height: 6\' 1"  (1.854 m)  Weight: 252 lb (114.306 kg)     ASSESSMENT & PLAN

## 2014-01-14 NOTE — Assessment & Plan Note (Signed)
He had a loculated pleural effusion in November of 2011. The tap was exudative but there was no evidence of cancer. Pulmonary felt that he needed no further workup at that time. I will ask pulmonary to see him again.

## 2014-01-14 NOTE — Assessment & Plan Note (Signed)
Catheterization in 2008 showed that his grafts were patent. There was evidence of low cardiac output at that time. His symptoms do not appear to be related to ischemia. However I will consider whether a nuclear stress study should be done as part of his evaluation.

## 2014-01-14 NOTE — Assessment & Plan Note (Signed)
He had an unexpected pericardial effusion in 2011. This will be ruled out again by echo

## 2014-01-14 NOTE — Assessment & Plan Note (Signed)
He has renal insufficiency but it does not appear to be severe at this time. I will leave it up to his primary team S2 whether or not he should see nephrology at some point.

## 2014-01-15 ENCOUNTER — Telehealth: Payer: Self-pay

## 2014-01-15 ENCOUNTER — Other Ambulatory Visit: Payer: Self-pay

## 2014-01-15 NOTE — Telephone Encounter (Addendum)
**Note De-Identified Jhair Witherington Obfuscation** LMTCB Per [name], and f/u on 10/26 OV, the pt needs to have 1. a complete Echo with contrast  2. a referral to Hematology (Dr Marin Olp to explain elevated Hemoglobin and does the pt have polycythemia) 3. full Pulmonary consult with PFT's due to exertional SOB/does the pt have asbestosis). 4.  the pt needs a sooner f/u with Dr Lovena Le

## 2014-01-16 ENCOUNTER — Other Ambulatory Visit: Payer: Self-pay | Admitting: *Deleted

## 2014-01-16 DIAGNOSIS — D751 Secondary polycythemia: Secondary | ICD-10-CM

## 2014-01-16 DIAGNOSIS — R0602 Shortness of breath: Secondary | ICD-10-CM

## 2014-01-17 ENCOUNTER — Other Ambulatory Visit: Payer: Self-pay

## 2014-01-17 ENCOUNTER — Telehealth: Payer: Self-pay

## 2014-01-17 DIAGNOSIS — I429 Cardiomyopathy, unspecified: Secondary | ICD-10-CM

## 2014-01-17 NOTE — Telephone Encounter (Signed)
**Note De-Identified [name] Obfuscation** 12/23/13  Weight= 241 lbs. The pts wife reports that the pt has continued right leg edema and increased SOB at this time.  12/24/13  Weight= 246 lbs.  The pts wife, Sayvion, states That the pt continues to have edema in his right leg and increased SOB at this time. 12/25/13  Weight= 244 lbs.  Jatavis reports no changes in the pts edema or sob at this time. 12/26/13  Weight= 243 lbs.   Waleed reports no changes in the pts edema or sob at this time. 12/27/13  Weight= 243 lbs.   Tudor reports no changes in the pts edema or sob at this time. 12/28/13  Weight= 243 lbs.  Almer reports no changes in the pts edema or sob at this time. 12/29/13  Weight= 245 lbs.  Eason reports no changes in the pts edema or sob at this time. 12/30/13  Weight= 242 lbs.   Giulio reports no changes in the pts edema or sob at this time. 12/31/13  Weight= 243 lbs.  Jeret reports no changes in the pts edema or sob at this time. 01/01/14  Weight= 244 lbs.  Guage reports no changes in the pts edema or sob at this time. 01/02/14  Weight= 244 lbs.   Naszir reports no changes in the pts edema or sob at this time. The pt had an OV with [name] today and his base weight has been increased to 244 lbs. He took a Chlorthalidone dose today due to weight of 244 lbs. 01/03/14  Weight= 243 lbs.   Ronny reports no changes in the pts edema or sob at this time. 01/04/14  Weight= 243 lbs.   Forrester reports no changes in the pts edema or sob at this time. 01/05/14  Weight= 246 lbs.   Windle reports no changes in the pts edema or sob at this time. He took a Chlorthalidone dose today due to weight greater than 244 lbs. 01/06/14  Weight= 244 lbs.  Randel reports no changes in the pts edema or sob at this time. He took a Chlorthalidone dose today due to weight of 244 lbs. 01/07/14  Weight= 240 lbs.  Ovadia reports no changes in the pts edema or sob at this time. 01/08/14  Weight= 241 lbs.  Franki reports no changes in the pts edema or sob  at this time. 01/09/14  Weight= 241 lbs.  Vahe reports no changes in the pts edema or sob at this time. 01/10/14  Weight= 240 lbs.  Equan reports no changes in the pts edema or sob at this time. 01/11/14  Weight= 244 lbs  Siddharth reports no changes in the pts edema or sob at this time. He took a Chlorthalidone dose today due to weight of 244 lbs. 01/12/14  Weight= 242 lbs.  Tigran reports no changes in the pts edema or sob at this time. 01/13/14  Weight= 243 lbs.  Cristen reports no changes in the pts edema or sob at this time. 01/14/14  Weight= 243 lbs.  Jaque reports no changes in the pts edema or sob at this time. 01/15/14  Weight= 243 lbs. Mehki reports no changes in the pts edema or sob at this time. 01/16/14  Weight= 244 lbs. Tamaj reports no changes in the pts edema or sob at this time.  He took a Chlorthalidone dose today due to weight of 244 lbs. 01/17/14  Weight= 242 lbs.  Bobie reports no changes in the pts edema or sob at this time.

## 2014-01-17 NOTE — Telephone Encounter (Signed)
The pt and his wife are advised and they both verbalized understanding. Echo and referral orders have been placed and a message has been sent to [facility] to call the pt and arrange all of above.

## 2014-01-20 ENCOUNTER — Encounter: Payer: Self-pay | Admitting: Internal Medicine

## 2014-01-20 ENCOUNTER — Other Ambulatory Visit (HOSPITAL_COMMUNITY): Payer: Medicare Other | Admitting: Cardiology

## 2014-01-20 ENCOUNTER — Ambulatory Visit (HOSPITAL_COMMUNITY): Payer: Medicare Other | Attending: Cardiovascular Disease | Admitting: Cardiology

## 2014-01-20 DIAGNOSIS — I10 Essential (primary) hypertension: Secondary | ICD-10-CM | POA: Insufficient documentation

## 2014-01-20 DIAGNOSIS — I429 Cardiomyopathy, unspecified: Secondary | ICD-10-CM | POA: Diagnosis not present

## 2014-01-20 DIAGNOSIS — E785 Hyperlipidemia, unspecified: Secondary | ICD-10-CM | POA: Insufficient documentation

## 2014-01-20 DIAGNOSIS — E119 Type 2 diabetes mellitus without complications: Secondary | ICD-10-CM | POA: Insufficient documentation

## 2014-01-20 DIAGNOSIS — I5020 Unspecified systolic (congestive) heart failure: Secondary | ICD-10-CM

## 2014-01-20 MED ORDER — PERFLUTREN LIPID MICROSPHERE
3.0000 mL | Freq: Once | INTRAVENOUS | Status: AC
  Administered 2014-01-20: 3 mL via INTRAVENOUS

## 2014-01-20 NOTE — Progress Notes (Signed)
Echo performed. 

## 2014-01-22 ENCOUNTER — Ambulatory Visit (INDEPENDENT_AMBULATORY_CARE_PROVIDER_SITE_OTHER): Payer: Medicare Other | Admitting: Internal Medicine

## 2014-01-22 ENCOUNTER — Ambulatory Visit (INDEPENDENT_AMBULATORY_CARE_PROVIDER_SITE_OTHER): Payer: Medicare Other | Admitting: Cardiology

## 2014-01-22 ENCOUNTER — Telehealth: Payer: Self-pay | Admitting: Hematology & Oncology

## 2014-01-22 ENCOUNTER — Encounter: Payer: Self-pay | Admitting: Cardiology

## 2014-01-22 ENCOUNTER — Encounter: Payer: Self-pay | Admitting: Internal Medicine

## 2014-01-22 VITALS — BP 98/60 | HR 81 | Ht 72.0 in | Wt 253.0 lb

## 2014-01-22 VITALS — BP 132/84 | HR 79 | Ht 72.0 in | Wt 252.0 lb

## 2014-01-22 DIAGNOSIS — R0602 Shortness of breath: Secondary | ICD-10-CM

## 2014-01-22 DIAGNOSIS — I4589 Other specified conduction disorders: Secondary | ICD-10-CM

## 2014-01-22 DIAGNOSIS — Z789 Other specified health status: Secondary | ICD-10-CM

## 2014-01-22 DIAGNOSIS — I429 Cardiomyopathy, unspecified: Secondary | ICD-10-CM

## 2014-01-22 DIAGNOSIS — R0989 Other specified symptoms and signs involving the circulatory and respiratory systems: Secondary | ICD-10-CM

## 2014-01-22 DIAGNOSIS — Z7289 Other problems related to lifestyle: Secondary | ICD-10-CM

## 2014-01-22 DIAGNOSIS — J929 Pleural plaque without asbestos: Secondary | ICD-10-CM

## 2014-01-22 DIAGNOSIS — I10 Essential (primary) hypertension: Secondary | ICD-10-CM

## 2014-01-22 DIAGNOSIS — R9430 Abnormal result of cardiovascular function study, unspecified: Secondary | ICD-10-CM

## 2014-01-22 MED ORDER — LOSARTAN POTASSIUM 25 MG PO TABS: 25.0000 mg | ORAL_TABLET | Freq: Every day | ORAL | Status: DC

## 2014-01-22 NOTE — Progress Notes (Signed)
Subjective:    Patient ID: [name], male    DOB: 1940-08-30   MRN: [id number]  HPI  44 yowm quit smoking around 1995 with some coughing then but completely better then slowed down back since 2010 then abruptly worse mid Oct 2015 with documented chf followed by [name] with EF around 30% 01/20/14 referred to pulmonary clinic with asbestos changes on cxr and unexplained resting sob on 01/22/14   01/22/2014  First ov/ consultation per [name]  Chief Complaint  Patient presents with  . Pulmonary Consult    Referred by Dr. Dola Argyle. Pt c/o SOB for the past 3 wks- "nothing consistant"- comes and goes with or without exertion. He states gets SOB walking minmal distance such as from room to room at home.   abrupt onset x 3 weeks sob at rest only sitting s warning, lasts up to one minute resolves on its own but also reproducible x 50 ft When I asked again the same questions re timing onset I got a different hx from husband and wife:  Pattern of sob at rest goes back sev years as does the doe, just worse x 3 weeks and never present sleeping flat  Episodes of sob at rest occur randomly during the day and are not related to meals/ speaking/ up  to 3-4 x per day never last over a minute, sometimes just a few breaths   No obvious day to day or daytime variabilty or assoc chronic cough or cp or chest tightness, subjective wheeze overt sinus or hb symptoms. No unusual exp hx or h/o childhood pna/ asthma or knowledge of premature birth.  Sleeping ok without nocturnal  or early am exacerbation  of respiratory  c/o's or need for noct saba. Also denies any obvious fluctuation of symptoms with weather or environmental changes or other aggravating or alleviating factors except as outlined above   Current Medications, Allergies, Complete Past Medical History, Past Surgical History, Family History, and Social History were reviewed in [employer] record.   .        Review of  Systems  Constitutional: Negative for fever, chills, activity change, appetite change and unexpected weight change.  HENT: Negative for congestion, dental problem, postnasal drip, rhinorrhea, sneezing, sore throat, trouble swallowing and voice change.   Eyes: Negative for visual disturbance.  Respiratory: Positive for shortness of breath. Negative for cough and choking.   Cardiovascular: Negative for chest pain and leg swelling.  Gastrointestinal: Negative for nausea, vomiting and abdominal pain.  Genitourinary: Negative for difficulty urinating.  Musculoskeletal: Negative for arthralgias.  Skin: Negative for rash.  Psychiatric/Behavioral: Negative for behavioral problems and confusion.       Objective:   Physical Exam  slt hoarse stoic wm nad  Wt Readings from Last 3 Encounters:  01/22/14 252 lb (114.306 kg)  01/22/14 253 lb (114.76 kg)  01/13/14 252 lb (114.306 kg)    Vital signs reviewed   HEENT: nl dentition, turbinates, and orophanx. Nl external ear canals without cough reflex   NECK :  without JVD/Nodes/TM/ nl carotid upstrokes bilaterally   LUNGS: no acc muscle use, clear to A and P bilaterally without cough on insp or exp maneuvers   CV:  RRR  no s3 or murmur or increase in P2, no edema   ABD:  soft and nontender with nl excursion in the supine position. No bruits or organomegaly, bowel sounds nl  MS:  warm without deformities, calf tenderness, cyanosis or clubbing  SKIN: warm and dry without lesions    NEURO:  alert, approp, no deficits    01/01/14 cxr 1. Chronic cardiomegaly without failure. 2. 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       Assessment & Plan:

## 2014-01-22 NOTE — Patient Instructions (Signed)
Try prilosec 20mg   Take 30-60 min before first meal of the day and Pepcid 20 mg one bedtime   GERD (REFLUX)  is an extremely common cause of respiratory symptoms just like yours , many times with no obvious heartburn at all.    It can be treated with medication, but also with lifestyle changes including avoidance of late meals, excessive alcohol, smoking cessation, and avoid fatty foods, chocolate, peppermint, colas, red wine, and acidic juices such as orange juice.  NO MINT OR MENTHOL PRODUCTS SO NO COUGH DROPS  USE SUGARLESS CANDY INSTEAD (Jolley ranchers or Stover's or Life Savers) or even ice chips will also do - the key is to swallow to prevent all throat clearing. NO OIL BASED VITAMINS - use powdered substitutes.   Please schedule a follow up office visit in 4 weeks, sooner if needed with pfts

## 2014-01-22 NOTE — Telephone Encounter (Signed)
Had note on desk to call pt. I left her message to call.

## 2014-01-22 NOTE — Patient Instructions (Addendum)
Your physician has recommended you make the following change in your medication: start taking Losartan 25 mg daily  Your physician recommends that you return for lab work in:11/17  Your physician recommends that you schedule a follow-up appointment in: 2 weeks

## 2014-01-23 ENCOUNTER — Encounter: Payer: Self-pay | Admitting: Internal Medicine

## 2014-01-23 DIAGNOSIS — J929 Pleural plaque without asbestos: Secondary | ICD-10-CM | POA: Insufficient documentation

## 2014-01-23 NOTE — Assessment & Plan Note (Signed)
October, 2015 worse but present for years per pt/ wife - 01/22/2014   Walked RA x one lap @ 185 stopped due to  Back pain, no sob albeit slow pace   Symptoms are markedly disproportionate to objective findings and not clear this is a lung problem but pt does appear to have difficult airway management issues.   Adherence is always the initial "prime suspect" and is a multilayered concern that requires a "trust but verify" approach in every patient - starting with knowing how to use medications, especially inhalers, correctly, keeping up with refills and understanding the fundamental difference between maintenance and prns vs those medications only taken for a very short course and then stopped and not refilled.  - not clear he's really taking all the meds listed on mar but none are likely to be causing him sob  ? Acid (or non-acid) GERD > always difficult to exclude as up to 75% of pts in some series report no assoc GI/ Heartburn symptoms> rec max (24h)  acid suppression and diet restrictions/ reviewed and instructions given in writing.   ? Anxiety / the sob that comes and goes at rest sev times a day from sev breaths to up to a min is classic for control of breathing issue, not a cardiac or pulmonary mech esp since he doesn't have it lying flat and we could not reproduce it here  chf > defer rx to [name]

## 2014-01-23 NOTE — Assessment & Plan Note (Signed)
Patient has follow-up arranged with Dr. Lovena Le to get more input concerning any adjustments in his pacemaker that might help with his exertional fatigue.

## 2014-01-23 NOTE — Assessment & Plan Note (Signed)
The patient's left ventricular function appears to be worse in the past year. I will decide at a later date if repeat catheterization is to be done. I will begin to try to adjust his medicines further for left ventricular dysfunction. The patient had an ACE inhibitor cough in the past. I will start very low-dose losartan. He does have renal insufficiency. This will be followed very carefully. I can also consider increasing the carvedilol dose. However there is been a question of chronotropic incompetence. I will need further input from Dr. Lovena Le concerning the optimal meds that may relate to his rate and his pacemaker.

## 2014-01-23 NOTE — Progress Notes (Signed)
Patient ID: [name], male   DOB: 06-30-1940, 73 y.o.   MRN: [id number]    HPI The patient is seen to follow-up coronary disease and cardiomyopathy and exertional shortness of breath and fatigue. I saw him last in the office on January 13, 2014. There is a very extensive note from that visit. I have arranged for multiple consultations for him. In addition two-dimensional echo was done. His ejection fraction now is in the 25-30% range. This is decreased since the time of his last evaluation.  Allergies  Allergen Reactions  . Avelox [Moxifloxacin Hcl In Nacl] Swelling, Rash and Other (See Comments)    Patient became hypotensive after infusion started Because of a history of documented adverse serious drug reaction;Medi Alert bracelet  is recommended  . Penicillins Anaphylaxis    REACTION: anaphylaxis Because of a history of documented adverse serious drug reaction;Medi Alert bracelet  is recommended    Current Outpatient Prescriptions  Medication Sig Dispense Refill  . ACCU-CHEK FASTCLIX LANCETS MISC Use to check blood sugars 3 times per day 100 each 5  . allopurinol (ZYLOPRIM) 100 MG tablet Take 1 tablet (100 mg total) by mouth daily. 90 tablet 3  . amLODipine (NORVASC) 5 MG tablet Take 2.5 mg by mouth 2 (two) times daily.    Marland Kitchen aspirin 81 MG tablet Take 81 mg by mouth daily.    Marland Kitchen atorvastatin (LIPITOR) 10 MG tablet Take 1 tablet (10 mg total) by mouth daily. 90 tablet 3  . carvedilol (COREG) 6.25 MG tablet Take 1 tablet (6.25 mg total) by mouth 2 (two) times daily. 180 tablet 1  . chlorthalidone (HYGROTON) 25 MG tablet Take 12.5 mg by mouth daily. Take when weight reaches 243 lbs. or greater only 90 tablet 3  . Cholecalciferol 10000 UNITS CAPS Take 1,000 Units by mouth daily.    . colchicine (COLCRYS) 0.6 MG tablet Take 1 tablet (0.6 mg total) by mouth as needed (for gout). 90 tablet 0  . furosemide (LASIX) 40 MG tablet Take 2 tablets (80 mg total) by mouth 2 (two) times daily. 180  tablet 1  . gabapentin (NEURONTIN) 100 MG capsule Take 1 capsule (100 mg total) by mouth 3 (three) times daily. 270 capsule 0  . glucose blood (ACCU-CHEK SMARTVIEW) test strip Use as instructed to check blood sugars 3 times per day dx code 250.72 100 each 5  . Insulin Detemir (LEVEMIR FLEXPEN) 100 UNIT/ML Pen Inject 35 Units into the skin at bedtime. 15 mL 3  . ipratropium (ATROVENT) 0.06 % nasal spray Place 1 spray into both nostrils as directed.     . metFORMIN (GLUCOPHAGE-XR) 750 MG 24 hr tablet TAKE ONE TABLET BY MOUTH ONCE DAILY WITH BREAKFAST 30 tablet 5  . naphazoline-pheniramine (NAPHCON-A) 0.025-0.3 % ophthalmic solution Place 1 drop into both eyes daily as needed for irritation.    . nitroGLYCERIN (NITROSTAT) 0.4 MG SL tablet Place 0.4 mg under the tongue every 5 (five) minutes as needed for chest pain (MAX 3 TABLETS).     . NOVOLOG FLEXPEN 100 UNIT/ML SOPN FlexPen Inject 12-14 Units into the skin 2 (two) times daily.     . potassium chloride SA (KLOR-CON M20) 20 MEQ tablet Take 1 tablet (20 mEq total) by mouth 3 (three) times daily. (Patient taking differently: Take 30 mEq by mouth 2 (two) times daily. ) 270 tablet 2  . warfarin (COUMADIN) 2.5 MG tablet TAKE AS DIRECTED BY ANTICOAGULATION CLINIC 120 tablet 0  . losartan (COZAAR) 25 MG tablet  Take 1 tablet (25 mg total) by mouth daily. 30 tablet 3   No current facility-administered medications for this visit.    History   Social History  . Marital Status: Married    Spouse Name: N/A    Number of Children: N/A  . Years of Education: N/A   Occupational History  . Retired- Nurse, mental health    Social History Main Topics  . Smoking status: Former Smoker -- 3.00 packs/day for 25 years    Types: Cigarettes    Quit date: 03/22/1995  . Smokeless tobacco: Former Systems developer  . Alcohol Use: 1.2 oz/week    2 Cans of beer per week     Comment: beer  . Drug Use: No  . Sexual Activity: No   Other Topics Concern  . Not on file    Social History Narrative    Family History  Problem Relation Age of Onset  . Hypertension Mother   . Stroke Mother   . Diabetes Father   . Coronary artery disease Father   . Other Father     DVT    Past Medical History  Diagnosis Date  . Diabetes mellitus   . Hyperlipidemia   . Hypertension   . Pilonidal cyst   . Atrial fibrillation     Previous long-term amiodarone therapy with multiple cardioversions / amiodarone stopped September, 2009  . Atrial flutter     Started November, 2010, Left-sided and cannot ablate  . Left atrial thrombus     Remote past... cardioversions done since that time  . Wide-complex tachycardia   . Left ventricular ejection fraction less than 40%   . Gout   . AAA (abdominal aortic aneurysm)     Surgical repair  . Discolored skin   . S/P ICD (internal cardiac defibrillator) procedure     Dr. Lovena Le 2009... by the pacing  . SOB (shortness of breath)     Large left effusion/ thoracentesis/hospitalization/November, 2011... exudated.. cytology negative.. Dr.Wert.. no proof of mesothelioma  . Pericardial effusion   . Pleural effusion     Large loculated effusion on the left side November, 2011. This was tapped. It was exudative. Cytology revealed no cancer no proof of mesothelioma area pulmonary team felt that no further workup was needed  . S/P AAA repair   . Spinal stenosis     Surgery Dr.Elsner  . CAD (coronary artery disease)     Catheterization July, 2008... name and vein grafts patent but low cardiac output  . Warfarin anticoagulation   . Cardiomyopathy     Ischemic... ICD  . Combined systolic and diastolic CHF   . Venous insufficiency     Toe discoloration chronic  . Mitral regurgitation     Mild echo  . Aortic valve sclerosis   . Nasal drainage     Chronic  . Alcohol ingestion of more than four drinks per week     Excess beer  not a dependency problem  . Chronotropic incompetence     IV pacing rate adjusted  . Thrombophlebitis of  superficial veins of upper extremities     Possible venous stenosis from defibrillator  . Pericardial effusion     November, 2011 .. decreased during hospitalization  . Eye abnormality     Ophthalmologist questions a clot in one of his eyes, May, 2012  . Overweight(278.02)     November, 2012  . Pleural thickening   . Ejection fraction     Ejection fraction has varied over time from 35-50%.,,  Echoes are technically very difficult,,, EF 50%, echo, May 25, 2011, technically very difficult  . Drug therapy     Redness and swelling with Avelox infusion May 24, 2011  . COPD (chronic obstructive pulmonary disease)   . Myocardial infarction   . Spinal cord stimulator status     October, 2013  . Carotid artery disease     Doppler, December, 2013, 0-39% bilateral  . Pneumonia   . Arthritis   . Ventral hernia     April, 2014, result of his abdominal surgery  . Bony abnormality     Patient's manubrium is slightly displaced to the right    Past Surgical History  Procedure Laterality Date  . Colonoscopy w/ polypectomy    . Lumbar fusion    . Pilonidal cyst removal    . Surgery scrotal / testicular    . Coronary artery bypass graft  2004  . Abdominal aortic aneurysm repair  11/2002  . Bi-ventricular pacemaker insertion (crt-p)  02-11-2013    Pt with previously implanted MDT CRTD downgraded to CRTP by Dr Lovena Le 02-11-13  . Back surgery    . Incision and drainage abscess / hematoma of bursa / knee / thigh      Patient Active Problem List   Diagnosis Date Noted  . Elevated hemoglobin 01/14/2014  . CKD (chronic kidney disease) stage 3, GFR 30-59 ml/min 01/14/2014  . Exertional shortness of breath 01/14/2014  . Asbestos exposure 01/14/2014  . Shortness of breath 01/01/2014  . Abscess, scrotum 03/05/2013  . Combined systolic and diastolic CHF   . Ejection fraction   . Hypopotassemia 12/20/2012  . ACE-inhibitor cough 10/04/2012  . Ventral hernia   . Bony abnormality   . Cellulitis of  scrotum 05/23/2012  . Carotid artery disease   . Spinal cord stimulator status   . 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 Atrial fibrillation   . Spinal stenosis   . CAD (coronary artery disease)   . Cardiomyopathy   . Venous insufficiency   . Mitral regurgitation   . Aortic valve sclerosis   . Alcohol ingestion of more than four drinks per week   . Chronotropic incompetence   . Thrombophlebitis of superficial veins of upper extremities   . Pericardial effusion   . Overweight   . Pleural thickening   . Pleural effusion   . Drug therapy   . Hyperlipidemia   . Hypertension   . Atrial flutter   . Left atrial thrombus   . AAA (abdominal aortic aneurysm)   . S/P ICD (internal cardiac defibrillator) procedure   . Warfarin anticoagulation   . Polyneuropathy in diabetes(357.2) 01/07/2010  . DIVERTICULOSIS, COLON 06/10/2009  . Uncontrolled diabetes mellitus with peripheral circulatory disorder 03/11/2009  . CKD (chronic kidney disease), stage III 06/19/2008  . BACK PAIN, LUMBAR 06/12/2008  . 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 GOUT 07/27/2006  . DVT 07/27/2006  . CORONARY ARTERY BYPASS GRAFT, HX OF 07/27/2006    ROS   The patient denies fever, chills, headache, rash, change in vision, change in hearing, chest pain, cough, nausea vomiting, urinary symptoms. All other systems are reviewed and are negative.  PHYSICAL EXAM   The patient is stable today. He is here with his wife. His weight has remained stable at 244. This is on his home scale. He is not having PND or orthopnea. He has exertional fatigue and shortness of breath. Head is atraumatic. Sclera and conjunctiva are normal. There is no  jugular venous distention. Lungs are clear. Respiratory effort is not labored. Cardiac exam reveals an S1 and S2. The abdomen is soft. There is no significant peripheral edema. There are no musculoskeletal deformities. There are no skin rashes.  Filed  Vitals:   01/22/14 1443  BP: 132/84  Pulse: 79  Height: 6' (1.829 m)  Weight: 252 lb (114.306 kg)   EKG is done today and reviewed by me. There is atrial fibrillation. The rate is controlled. There is left bundle branch block.  ASSESSMENT & PLAN

## 2014-01-23 NOTE — Assessment & Plan Note (Signed)
Extended discussion with pt and wife:  This is a marker of exp but would not explain any of his symptoms and there is no evidence of active asbestos related lung dz at this point  Yearly f/u cxr is all that I rec plus baseline pfts/ planned

## 2014-01-23 NOTE — Assessment & Plan Note (Signed)
At this point I'm continuing to try to assess his exertional fatigue. Pulmonary consultation is in process. Hematology consultation is to be done to rule out polycythemia. I am adjusting meds for his LV dysfunction. He'll see Dr. Lovena Le for any further input concerning his overall heart rate and pacing function. He will completely stop alcohol intake.

## 2014-01-23 NOTE — Assessment & Plan Note (Signed)
Historically the patient has had regular alcohol intake. We have never felt that this was a clinical problem. He now has decreased left ventricular function. I've spoken with the patient and his wife. We will stop completely his alcohol intake.

## 2014-01-23 NOTE — Assessment & Plan Note (Signed)
Grafts were patent in July, 2008. There was low cardiac output at that time. He is had a change in his LV function that seems to have occurred within the past 12 months. I may consider repeat catheterization. He is not having angina at this time. I do not know if his exertional fatigue could represent ischemia or not.

## 2014-01-23 NOTE — Assessment & Plan Note (Signed)
Echo in October, 2015 now shows worsening ejection fraction. The last echo was November, 2014. Study was technically difficult but the EF was felt to be 40-45%. EF is now felt to be 25-30%.

## 2014-02-04 ENCOUNTER — Other Ambulatory Visit (INDEPENDENT_AMBULATORY_CARE_PROVIDER_SITE_OTHER): Payer: Medicare Other | Admitting: *Deleted

## 2014-02-04 ENCOUNTER — Other Ambulatory Visit: Payer: Self-pay | Admitting: Endocrinology

## 2014-02-04 DIAGNOSIS — I10 Essential (primary) hypertension: Secondary | ICD-10-CM

## 2014-02-04 DIAGNOSIS — E1165 Type 2 diabetes mellitus with hyperglycemia: Secondary | ICD-10-CM

## 2014-02-04 DIAGNOSIS — IMO0002 Reserved for concepts with insufficient information to code with codable children: Secondary | ICD-10-CM

## 2014-02-05 ENCOUNTER — Ambulatory Visit (INDEPENDENT_AMBULATORY_CARE_PROVIDER_SITE_OTHER): Payer: Medicare Other | Admitting: Cardiology

## 2014-02-05 ENCOUNTER — Encounter: Payer: Self-pay | Admitting: Cardiology

## 2014-02-05 VITALS — BP 136/76 | HR 56 | Ht 72.0 in | Wt 250.8 lb

## 2014-02-05 DIAGNOSIS — Z789 Other specified health status: Secondary | ICD-10-CM

## 2014-02-05 DIAGNOSIS — Z7289 Other problems related to lifestyle: Secondary | ICD-10-CM

## 2014-02-05 DIAGNOSIS — I4589 Other specified conduction disorders: Secondary | ICD-10-CM

## 2014-02-05 DIAGNOSIS — R0602 Shortness of breath: Secondary | ICD-10-CM

## 2014-02-05 DIAGNOSIS — I255 Ischemic cardiomyopathy: Secondary | ICD-10-CM | POA: Insufficient documentation

## 2014-02-05 LAB — BASIC METABOLIC PANEL
BUN: 33 mg/dL — ABNORMAL HIGH (ref 6–23)
CHLORIDE: 103 meq/L (ref 96–112)
CO2: 26 meq/L (ref 19–32)
CREATININE: 1.6 mg/dL — AB (ref 0.4–1.5)
Calcium: 9 mg/dL (ref 8.4–10.5)
GFR: 46.97 mL/min — ABNORMAL LOW (ref >60.00)
GLUCOSE: 193 mg/dL — AB (ref 70–99)
Potassium: 3.8 mEq/L (ref 3.5–5.1)
Sodium: 140 mEq/L (ref 135–145)

## 2014-02-05 MED ORDER — LOSARTAN POTASSIUM 50 MG PO TABS: 50.0000 mg | ORAL_TABLET | Freq: Every day | ORAL | Status: DC

## 2014-02-05 NOTE — Assessment & Plan Note (Signed)
The patient is now completely off alcohol. He is tolerating the resumption of his losartan 25 mg. This will be increased to 50 mg. I will then see him for follow-up. I have not changed his carvedilol dose because of the ongoing question of chronotropic incompetence. I will wait for input from Dr. Lovena Le concerning how we might be able to help him from the electrophysiology viewpoint.

## 2014-02-05 NOTE — Patient Instructions (Signed)
**Note De-Identified Fleda Pagel Obfuscation** Your physician has recommended you make the following change in your medication: increase Losartan to 50 mg daily.  Your physician recommends that you schedule a follow-up appointment in: about 1 month

## 2014-02-05 NOTE — Assessment & Plan Note (Signed)
The patient will be seeing Dr. Lovena Le on November 19 to assess his pacemaker function. I'm hopeful that an adjustment might be possible that could help him with his exertional shortness of breath.

## 2014-02-05 NOTE — Assessment & Plan Note (Signed)
Dr. Melvyn Novas has seen the patient on one occasion. He has started omeprazole for the possibility that some of his symptoms are related to reflux. Full pulmonary function studies will be done and the patient will be seen by Dr. Melvyn Novas again concerning the assessment of his shortness of breath.  Scheduled appointments include Dr. Lovena Le on November 19, Dr. Marin Olp on November 23 for the assessment of the possibility of polycythemia, Dr. Melvyn Novas for pulmonary function studies and an office visit on December 9. He also has follow-up with Dr. Dwyane Dee.

## 2014-02-05 NOTE — Assessment & Plan Note (Signed)
It has never been felt that the patient had a significant alcohol problem. However with his worsening LV function, I have asked him to stop completely. He has done this.

## 2014-02-05 NOTE — Progress Notes (Signed)
Patient ID: [name], male   DOB: 1940/08/31, 73 y.o.   MRN: [id number]    HPI  patient is seen today to follow up coronary disease and cardiomyopathy. I saw him last January 22, 2014. There is an extensive note with extensive plans. At that time I started him back on 25 mg of losartan. He's doing well with this. Chemistry is checked again today. His BUN and creatinine are stable. Potassium was stable. Were watching his blood pressure carefully. If his pressure becomes too low, we will back off his amlodipine. However with severe left ventricular dysfunction, having his pressure in the low 100s will be optimal if he can tolerate it.  At the last visit I had also asked him to stop all alcohol intake completely. He has done this.  Allergies  Allergen Reactions  . Avelox [Moxifloxacin Hcl In Nacl] Swelling, Rash and Other (See Comments)    Patient became hypotensive after infusion started Because of a history of documented adverse serious drug reaction;Medi Alert bracelet  is recommended  . Penicillins Anaphylaxis    REACTION: anaphylaxis Because of a history of documented adverse serious drug reaction;Medi Alert bracelet  is recommended    Current Outpatient Prescriptions  Medication Sig Dispense Refill  . ACCU-CHEK FASTCLIX LANCETS MISC USE ONE LANCET TO CHECK BLOOD SUGAR THREE TIMES DAILY 102 each 5  . ACCU-CHEK SMARTVIEW test strip USE ONE STRIP TO CHECK GLUCOSE THREE TIMES DAILY 100 each 5  . allopurinol (ZYLOPRIM) 100 MG tablet Take 1 tablet (100 mg total) by mouth daily. 90 tablet 3  . amLODipine (NORVASC) 5 MG tablet Take 2.5 mg by mouth 2 (two) times daily.    Marland Kitchen aspirin 81 MG tablet Take 81 mg by mouth daily.    Marland Kitchen atorvastatin (LIPITOR) 10 MG tablet Take 1 tablet (10 mg total) by mouth daily. 90 tablet 3  . carvedilol (COREG) 6.25 MG tablet Take 1 tablet (6.25 mg total) by mouth 2 (two) times daily. 180 tablet 1  . chlorthalidone (HYGROTON) 25 MG tablet Take 12.5 mg by mouth  daily. Take when weight reaches 243 lbs. or greater only 90 tablet 3  . Cholecalciferol 10000 UNITS CAPS Take 1,000 Units by mouth daily.    . colchicine (COLCRYS) 0.6 MG tablet Take 1 tablet (0.6 mg total) by mouth as needed (for gout). 90 tablet 0  . furosemide (LASIX) 40 MG tablet Take 2 tablets (80 mg total) by mouth 2 (two) times daily. 180 tablet 1  . gabapentin (NEURONTIN) 100 MG capsule Take 1 capsule (100 mg total) by mouth 3 (three) times daily. 270 capsule 0  . Insulin Detemir (LEVEMIR FLEXPEN) 100 UNIT/ML Pen Inject 35 Units into the skin at bedtime. 15 mL 3  . ipratropium (ATROVENT) 0.06 % nasal spray Place 1 spray into both nostrils as directed.     Marland Kitchen losartan (COZAAR) 25 MG tablet Take 1 tablet (25 mg total) by mouth daily. 30 tablet 3  . metFORMIN (GLUCOPHAGE-XR) 750 MG 24 hr tablet TAKE ONE TABLET BY MOUTH ONCE DAILY WITH BREAKFAST 30 tablet 5  . naphazoline-pheniramine (NAPHCON-A) 0.025-0.3 % ophthalmic solution Place 1 drop into both eyes daily as needed for irritation.    . nitroGLYCERIN (NITROSTAT) 0.4 MG SL tablet Place 0.4 mg under the tongue every 5 (five) minutes as needed for chest pain (MAX 3 TABLETS).     . NOVOLOG FLEXPEN 100 UNIT/ML SOPN FlexPen Inject 12-14 Units into the skin 2 (two) times daily.     Marland Kitchen  potassium chloride SA (KLOR-CON M20) 20 MEQ tablet Take 1 tablet (20 mEq total) by mouth 3 (three) times daily. (Patient taking differently: Take 30 mEq by mouth 2 (two) times daily. ) 270 tablet 2  . warfarin (COUMADIN) 2.5 MG tablet TAKE AS DIRECTED BY ANTICOAGULATION CLINIC 120 tablet 0   No current facility-administered medications for this visit.    History   Social History  . Marital Status: Married    Spouse Name: N/A    Number of Children: N/A  . Years of Education: N/A   Occupational History  . Retired- Nurse, mental health    Social History Main Topics  . Smoking status: Former Smoker -- 3.00 packs/day for 25 years    Types: Cigarettes     Quit date: 03/22/1995  . Smokeless tobacco: Former Systems developer  . Alcohol Use: 1.2 oz/week    2 Cans of beer per week     Comment: beer  . Drug Use: No  . Sexual Activity: No   Other Topics Concern  . Not on file   Social History Narrative    Family History  Problem Relation Age of Onset  . Hypertension Mother   . Stroke Mother   . Diabetes Father   . Coronary artery disease Father   . Other Father     DVT    Past Medical History  Diagnosis Date  . Diabetes mellitus   . Hyperlipidemia   . Hypertension   . Pilonidal cyst   . Atrial fibrillation     Previous long-term amiodarone therapy with multiple cardioversions / amiodarone stopped September, 2009  . Atrial flutter     Started November, 2010, Left-sided and cannot ablate  . Left atrial thrombus     Remote past... cardioversions done since that time  . Wide-complex tachycardia   . Left ventricular ejection fraction less than 40%   . Gout   . AAA (abdominal aortic aneurysm)     Surgical repair  . Discolored skin   . S/P ICD (internal cardiac defibrillator) procedure     Dr. Lovena Le 2009... by the pacing  . SOB (shortness of breath)     Large left effusion/ thoracentesis/hospitalization/November, 2011... exudated.. cytology negative.. Dr.Wert.. no proof of mesothelioma  . Pericardial effusion   . Pleural effusion     Large loculated effusion on the left side November, 2011. This was tapped. It was exudative. Cytology revealed no cancer no proof of mesothelioma area pulmonary team felt that no further workup was needed  . S/P AAA repair   . Spinal stenosis     Surgery Dr.Elsner  . CAD (coronary artery disease)     Catheterization July, 2008... name and vein grafts patent but low cardiac output  . Warfarin anticoagulation   . Cardiomyopathy     Ischemic... ICD  . Combined systolic and diastolic CHF   . Venous insufficiency     Toe discoloration chronic  . Mitral regurgitation     Mild echo  . Aortic valve sclerosis    . Nasal drainage     Chronic  . Alcohol ingestion of more than four drinks per week     Excess beer  not a dependency problem  . Chronotropic incompetence     IV pacing rate adjusted  . Thrombophlebitis of superficial veins of upper extremities     Possible venous stenosis from defibrillator  . Pericardial effusion     November, 2011 .. decreased during hospitalization  . Eye abnormality     Ophthalmologist  questions a clot in one of his eyes, May, 2012  . Overweight(278.02)     November, 2012  . Pleural thickening   . Ejection fraction     Ejection fraction has varied over time from 35-50%.,, Echoes are technically very difficult,,, EF 50%, echo, May 25, 2011, technically very difficult  . Drug therapy     Redness and swelling with Avelox infusion May 24, 2011  . COPD (chronic obstructive pulmonary disease)   . Myocardial infarction   . Spinal cord stimulator status     October, 2013  . Carotid artery disease     Doppler, December, 2013, 0-39% bilateral  . Pneumonia   . Arthritis   . Ventral hernia     April, 2014, result of his abdominal surgery  . Bony abnormality     Patient's manubrium is slightly displaced to the right    Past Surgical History  Procedure Laterality Date  . Colonoscopy w/ polypectomy    . Lumbar fusion    . Pilonidal cyst removal    . Surgery scrotal / testicular    . Coronary artery bypass graft  2004  . Abdominal aortic aneurysm repair  11/2002  . Bi-ventricular pacemaker insertion (crt-p)  02-11-2013    Pt with previously implanted MDT CRTD downgraded to CRTP by Dr Lovena Le 02-11-13  . Back surgery    . Incision and drainage abscess / hematoma of bursa / knee / thigh      Patient Active Problem List   Diagnosis Date Noted  . Pleural plaque without asbestos 01/23/2014  . Elevated hemoglobin 01/14/2014  . CKD (chronic kidney disease) stage 3, GFR 30-59 ml/min 01/14/2014  . Exertional shortness of breath 01/14/2014  . Asbestos exposure  01/14/2014  . Shortness of breath 01/01/2014  . Abscess, scrotum 03/05/2013  . Combined systolic and diastolic CHF   . Ejection fraction   . Hypopotassemia 12/20/2012  . ACE-inhibitor cough 10/04/2012  . Ventral hernia   . Bony abnormality   . Cellulitis of scrotum 05/23/2012  . Carotid artery disease   . Spinal cord stimulator status   . 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 Atrial fibrillation   . Spinal stenosis   . CAD (coronary artery disease)   . Cardiomyopathy   . Venous insufficiency   . Mitral regurgitation   . Aortic valve sclerosis   . Alcohol ingestion of more than four drinks per week   . Chronotropic incompetence   . Thrombophlebitis of superficial veins of upper extremities   . Pericardial effusion   . Overweight   . Pleural thickening   . Pleural effusion   . Drug therapy   . Hyperlipidemia   . Hypertension   . Atrial flutter   . Left atrial thrombus   . AAA (abdominal aortic aneurysm)   . S/P ICD (internal cardiac defibrillator) procedure   . Warfarin anticoagulation   . Polyneuropathy in diabetes(357.2) 01/07/2010  . DIVERTICULOSIS, COLON 06/10/2009  . Uncontrolled diabetes mellitus with peripheral circulatory disorder 03/11/2009  . CKD (chronic kidney disease), stage III 06/19/2008  . BACK PAIN, LUMBAR 06/12/2008  . 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 GOUT 07/27/2006  . DVT 07/27/2006  . CORONARY ARTERY BYPASS GRAFT, HX OF 07/27/2006    ROS  Patient denies fever, chills, headache, sweats, rash, change in vision, change in hearing, chest pain, cough, nausea or vomiting, urinary symptoms. All other systems are reviewed and are negative.  PHYSICAL EXAM Patient is overweight. He is  stable. He is here with his wife. He is oriented to person time and place. Affect is normal. Head is atraumatic. Sclera and conjunctiva are normal. There is no jugular venous distention. Lungs are clear. Respiratory effort is  nonlabored. Cardiac exam reveals an S1 and S2. The abdomen is protuberant but unchanged. There is no significant peripheral edema.  Filed Vitals:   02/05/14 1600  BP: 136/76  Pulse: 56  Height: 6' (1.829 m)  Weight: 250 lb 12.8 oz (113.762 kg)     ASSESSMENT & PLAN

## 2014-02-06 ENCOUNTER — Ambulatory Visit (INDEPENDENT_AMBULATORY_CARE_PROVIDER_SITE_OTHER): Payer: Medicare Other | Admitting: Internal Medicine

## 2014-02-06 ENCOUNTER — Ambulatory Visit (INDEPENDENT_AMBULATORY_CARE_PROVIDER_SITE_OTHER): Payer: Medicare Other

## 2014-02-06 ENCOUNTER — Encounter: Payer: Self-pay | Admitting: Internal Medicine

## 2014-02-06 ENCOUNTER — Encounter (HOSPITAL_BASED_OUTPATIENT_CLINIC_OR_DEPARTMENT_OTHER): Payer: Medicare Other

## 2014-02-06 ENCOUNTER — Encounter: Payer: Self-pay | Admitting: Radiology

## 2014-02-06 VITALS — BP 120/76 | HR 66 | Ht 72.0 in | Wt 252.4 lb

## 2014-02-06 DIAGNOSIS — I509 Heart failure, unspecified: Secondary | ICD-10-CM

## 2014-02-06 DIAGNOSIS — I4891 Unspecified atrial fibrillation: Secondary | ICD-10-CM

## 2014-02-06 DIAGNOSIS — I255 Ischemic cardiomyopathy: Secondary | ICD-10-CM

## 2014-02-06 DIAGNOSIS — I482 Chronic atrial fibrillation, unspecified: Secondary | ICD-10-CM

## 2014-02-06 DIAGNOSIS — Z7901 Long term (current) use of anticoagulants: Secondary | ICD-10-CM

## 2014-02-06 LAB — MDC_IDC_ENUM_SESS_TYPE_INCLINIC
Brady Statistic AP VS Percent: 0 %
Brady Statistic AS VP Percent: 10.86 %
Brady Statistic AS VS Percent: 89.14 %
Brady Statistic RA Percent Paced: 0 %
Date Time Interrogation Session: 20151119132557
HighPow Impedance: 171 Ohm
HighPow Impedance: 71 Ohm
Lead Channel Impedance Value: 4047 Ohm
Lead Channel Impedance Value: 4047 Ohm
Lead Channel Impedance Value: 456 Ohm
Lead Channel Impedance Value: 513 Ohm
Lead Channel Sensing Intrinsic Amplitude: 0.875 mV
Lead Channel Sensing Intrinsic Amplitude: 1.125 mV
Lead Channel Sensing Intrinsic Amplitude: 10.875 mV
Lead Channel Sensing Intrinsic Amplitude: 13 mV
MDC IDC MSMT BATTERY REMAINING LONGEVITY: 121 mo
MDC IDC MSMT BATTERY VOLTAGE: 3 V
MDC IDC MSMT LEADCHNL RV IMPEDANCE VALUE: 456 Ohm
MDC IDC MSMT LEADCHNL RV PACING THRESHOLD AMPLITUDE: 0.75 V
MDC IDC MSMT LEADCHNL RV PACING THRESHOLD PULSEWIDTH: 0.4 ms
MDC IDC SET LEADCHNL RV PACING AMPLITUDE: 2.5 V
MDC IDC SET LEADCHNL RV PACING PULSEWIDTH: 0.4 ms
MDC IDC SET LEADCHNL RV SENSING SENSITIVITY: 0.3 mV
MDC IDC SET ZONE DETECTION INTERVAL: 350 ms
MDC IDC SET ZONE DETECTION INTERVAL: 450 ms
MDC IDC STAT BRADY AP VP PERCENT: 0 %
MDC IDC STAT BRADY RV PERCENT PACED: 14.99 %
Zone Setting Detection Interval: 300 ms
Zone Setting Detection Interval: 340 ms

## 2014-02-06 LAB — POCT INR: INR: 1.9

## 2014-02-06 NOTE — Progress Notes (Signed)
[facility] 24hr holter applied

## 2014-02-06 NOTE — Assessment & Plan Note (Signed)
His heart failure symptoms are class IIIB. Unfortunately we have very little treatment option for the patient short of referral to the advanced heart failure clinic. I do not think turning on his left ventricular lead will be helpful as he had underlying right bundle branch block in the past. It is unclear to me is whether or not his ventricular rate is controlled in atrial fibrillation. I have asked the patient to wear a 24-hour heart monitor. If his rates are increased, we would try to uptitrate his medical therapy as tolerated. The patient has had some difficulty with compliance with his diet in the past. I agree with the decision to discontinue all alcohol, and to continue to maintain a low salt diet. Finally, the patient admits to drinking 5-7 caffeinated beverages a day. I have recommended that he stop this, and reduce his caffeine take.

## 2014-02-06 NOTE — Patient Instructions (Signed)
Your physician recommends that you schedule a follow-up appointment as scheduled   Your physician has recommended that you wear a holter monitor. Holter monitors are medical devices that record the heart's electrical activity. Doctors most often use these monitors to diagnose arrhythmias. Arrhythmias are problems with the speed or rhythm of the heartbeat. The monitor is a small, portable device. You can wear one while you do your normal daily activities. This is usually used to diagnose what is causing palpitations/syncope (passing out).

## 2014-02-06 NOTE — Assessment & Plan Note (Signed)
The patient has been off of amiodarone for 6 years, and had a very difficult time maintaining sinus rhythm in the past. He does not appear any worse in atrial fibrillation initially. The real question now is whether or not his ventricular rate in atrial fibrillation is controlled. I've asked the patient to wear a 24-hour Holter heart monitor. Additional adjustments in his medications could be considered based on the results of the heart monitor.

## 2014-02-06 NOTE — Progress Notes (Signed)
HPI Mr. Gallery returns today for followup. He is a 73 year old man with an ischemic cardiomyopathy, chronic atrial fibrillation, status post biventricular ICD implantation. The patient's heart failure symptoms improved and his ejection fraction improved. He was subsequently found to have an increased left ventricular pacing threshold, and on his ICD change out over a year ago, the left ventricular lead was programmed off. The patient has had worsening heart failure symptoms and over the past year, his EF has been reduced from 45% to 25%. He has worsening heart failure symptoms. The patient has been in atrial fibrillation for a couple of years now. Initially, he did not appear worse compared to when he was in sinus rhythm. He does not have palpitations. It is unclear whether his ventricular rate is well controlled or not, and his left ventricular lead remains programmed off secondary to a very elevated pacing thresholds. Allergies  Allergen Reactions  . Avelox [Moxifloxacin Hcl In Nacl] Swelling, Rash and Other (See Comments)    Patient became hypotensive after infusion started Because of a history of documented adverse serious drug reaction;Medi Alert bracelet  is recommended  . Penicillins Anaphylaxis    REACTION: anaphylaxis Because of a history of documented adverse serious drug reaction;Medi Alert bracelet  is recommended     Current Outpatient Prescriptions  Medication Sig Dispense Refill  . ACCU-CHEK FASTCLIX LANCETS MISC USE ONE LANCET TO CHECK BLOOD SUGAR THREE TIMES DAILY 102 each 5  . ACCU-CHEK SMARTVIEW test strip USE ONE STRIP TO CHECK GLUCOSE THREE TIMES DAILY 100 each 5  . allopurinol (ZYLOPRIM) 100 MG tablet Take 1 tablet (100 mg total) by mouth daily. 90 tablet 3  . amLODipine (NORVASC) 5 MG tablet Take 2.5 mg by mouth 2 (two) times daily.    Marland Kitchen aspirin 81 MG tablet Take 81 mg by mouth daily.    Marland Kitchen atorvastatin (LIPITOR) 10 MG tablet Take 1 tablet (10 mg total) by mouth  daily. 90 tablet 3  . carvedilol (COREG) 6.25 MG tablet Take 1 tablet (6.25 mg total) by mouth 2 (two) times daily. 180 tablet 1  . chlorthalidone (HYGROTON) 25 MG tablet Take 12.5 mg by mouth daily. Take when weight reaches 243 lbs. or greater only 90 tablet 3  . Cholecalciferol 10000 UNITS CAPS Take 1,000 Units by mouth daily.    . colchicine (COLCRYS) 0.6 MG tablet Take 1 tablet (0.6 mg total) by mouth as needed (for gout). 90 tablet 0  . furosemide (LASIX) 40 MG tablet Take 2 tablets (80 mg total) by mouth 2 (two) times daily. 180 tablet 1  . gabapentin (NEURONTIN) 100 MG capsule Take 1 capsule (100 mg total) by mouth 3 (three) times daily. 270 capsule 0  . Insulin Detemir (LEVEMIR FLEXPEN) 100 UNIT/ML Pen Inject 35 Units into the skin at bedtime. 15 mL 3  . ipratropium (ATROVENT) 0.06 % nasal spray Place 1 spray into both nostrils as directed.     Marland Kitchen losartan (COZAAR) 50 MG tablet Take 1 tablet (50 mg total) by mouth daily. 30 tablet 3  . metFORMIN (GLUCOPHAGE-XR) 750 MG 24 hr tablet TAKE ONE TABLET BY MOUTH ONCE DAILY WITH BREAKFAST 30 tablet 5  . naphazoline-pheniramine (NAPHCON-A) 0.025-0.3 % ophthalmic solution Place 1 drop into both eyes daily as needed for irritation.    . nitroGLYCERIN (NITROSTAT) 0.4 MG SL tablet Place 0.4 mg under the tongue every 5 (five) minutes as needed for chest pain (MAX 3 TABLETS).     Tobie Lords  100 UNIT/ML SOPN FlexPen Inject 12-14 Units into the skin 2 (two) times daily.     . potassium chloride SA (KLOR-CON M20) 20 MEQ tablet Take 1 tablet (20 mEq total) by mouth 3 (three) times daily. (Patient taking differently: Take 30 mEq by mouth 2 (two) times daily. ) 270 tablet 2  . warfarin (COUMADIN) 2.5 MG tablet TAKE AS DIRECTED BY ANTICOAGULATION CLINIC 120 tablet 0   No current facility-administered medications for this visit.     Past Medical History  Diagnosis Date  . Diabetes mellitus   . Hyperlipidemia   . Hypertension   . Pilonidal cyst   .  Atrial fibrillation     Previous long-term amiodarone therapy with multiple cardioversions / amiodarone stopped September, 2009  . Atrial flutter     Started November, 2010, Left-sided and cannot ablate  . Left atrial thrombus     Remote past... cardioversions done since that time  . Wide-complex tachycardia   . Left ventricular ejection fraction less than 40%   . Gout   . AAA (abdominal aortic aneurysm)     Surgical repair  . Discolored skin   . S/P ICD (internal cardiac defibrillator) procedure     Dr. Lovena Le 2009... by the pacing  . SOB (shortness of breath)     Large left effusion/ thoracentesis/hospitalization/November, 2011... exudated.. cytology negative.. Dr.Wert.. no proof of mesothelioma  . Pericardial effusion   . Pleural effusion     Large loculated effusion on the left side November, 2011. This was tapped. It was exudative. Cytology revealed no cancer no proof of mesothelioma area pulmonary team felt that no further workup was needed  . S/P AAA repair   . Spinal stenosis     Surgery Dr.Elsner  . CAD (coronary artery disease)     Catheterization July, 2008... name and vein grafts patent but low cardiac output  . Warfarin anticoagulation   . Cardiomyopathy     Ischemic... ICD  . Combined systolic and diastolic CHF   . Venous insufficiency     Toe discoloration chronic  . Mitral regurgitation     Mild echo  . Aortic valve sclerosis   . Nasal drainage     Chronic  . Alcohol ingestion of more than four drinks per week     Excess beer  not a dependency problem  . Chronotropic incompetence     IV pacing rate adjusted  . Thrombophlebitis of superficial veins of upper extremities     Possible venous stenosis from defibrillator  . Pericardial effusion     November, 2011 .. decreased during hospitalization  . Eye abnormality     Ophthalmologist questions a clot in one of his eyes, May, 2012  . Overweight(278.02)     November, 2012  . Pleural thickening   . Ejection  fraction     Ejection fraction has varied over time from 35-50%.,, Echoes are technically very difficult,,, EF 50%, echo, May 25, 2011, technically very difficult  . Drug therapy     Redness and swelling with Avelox infusion May 24, 2011  . COPD (chronic obstructive pulmonary disease)   . Myocardial infarction   . Spinal cord stimulator status     October, 2013  . Carotid artery disease     Doppler, December, 2013, 0-39% bilateral  . Pneumonia   . Arthritis   . Ventral hernia     April, 2014, result of his abdominal surgery  . Bony abnormality     Patient's manubrium is slightly  displaced to the right    ROS:   All systems reviewed and negative except as noted in the HPI.   Past Surgical History  Procedure Laterality Date  . Colonoscopy w/ polypectomy    . Lumbar fusion    . Pilonidal cyst removal    . Surgery scrotal / testicular    . Coronary artery bypass graft  2004  . Abdominal aortic aneurysm repair  11/2002  . Bi-ventricular pacemaker insertion (crt-p)  02-11-2013    Pt with previously implanted MDT CRTD downgraded to CRTP by Dr Lovena Le 02-11-13  . Back surgery    . Incision and drainage abscess / hematoma of bursa / knee / thigh       Family History  Problem Relation Age of Onset  . Hypertension Mother   . Stroke Mother   . Diabetes Father   . Coronary artery disease Father   . Other Father     DVT     History   Social History  . Marital Status: Married    Spouse Name: N/A    Number of Children: N/A  . Years of Education: N/A   Occupational History  . Retired- Nurse, mental health    Social History Main Topics  . Smoking status: Former Smoker -- 3.00 packs/day for 25 years    Types: Cigarettes    Quit date: 03/22/1995  . Smokeless tobacco: Former Systems developer  . Alcohol Use: 1.2 oz/week    2 Cans of beer per week     Comment: beer  . Drug Use: No  . Sexual Activity: No   Other Topics Concern  . Not on file   Social History Narrative      BP 120/76 mmHg  Pulse 66  Ht 6' (1.829 m)  Wt 252 lb 6.4 oz (114.488 kg)  BMI 34.22 kg/m2  Physical Exam:  Well appearing 73 year old man,NAD HEENT: Unremarkable Neck:  7 cm JVD, no thyromegally Back:  No CVA tenderness Lungs:  Clearwith no wheezes, rales, or rhonchi. Well-healed ICD incision. HEART:  IRegular rate rhythm, no murmurs, no rubs, no clicks Abd:  soft, positive bowel sounds, no organomegally, no rebound, no guarding Ext:  2 plus pulses, no edema, no cyanosis, no clubbing Skin:  No rashes no nodules Neuro:  CN II through XII intact, motor grossly intact  DEVICE  Normal device function.  See PaceArt for details.  He is pacing in the ventricle 70% of the time  Assess/Plan:

## 2014-02-07 ENCOUNTER — Telehealth: Payer: Self-pay

## 2014-02-07 ENCOUNTER — Telehealth: Payer: Self-pay | Admitting: Hematology & Oncology

## 2014-02-07 NOTE — Telephone Encounter (Signed)
I spoke w NEW PATIENT today to remind them of their appointment with Dr. Ennever. Also, advised them to bring all medication bottles and insurance card information. ° °

## 2014-02-07 NOTE — Telephone Encounter (Signed)
**Note De-Identified [name] Obfuscation** 01/19/14  Weight= 243 lbs.  The pts wife, [name], states that the pt continues to have edema in his right leg and that he has mild SOB at this time. 01/20/14  Weight= 244 lbs.  [name] states that the pt continues to have swelling in his right leg and that he has moderate SOB at this time. The pt took a chlorthalidone dose today due to weight of 244 lbs.  01/21/14  Weight= 243 lbs. [name] reports no changes in the pts right leg edema or his SOB at this time.  *01/22/14  Home weight= 243 lbs. Office weight= 252 lbs. The pt had OV with Dr [name] today. The pt was advised to start taking Losartan 25 mg daily, have BMET drawn and to f/u in 2 weeks. 01/23/14  Weight= 246 lbs.  [name] states that the pt's right leg continues to be swollen and that his SOB is a little better today. The pt took a chlorthalidone dose today due to weight greater than 244 lbs.  01/24/14  Weight= 245 lbs.  The pts wife, [name], states that the pt continues to have edema in his right leg and that he has mild SOB at this time.  The pt took a chlorthalidone dose today due to weight greater than 244 lbs.  01/25/14  Weight= 243 lbs.  [name] reports no changes in the pts right leg edema or his SOB at this time.  01/26/14  Weight= 244 lbs.  The pts wife, [name], states that the pt continues to have edema in his right leg and that he has mild SOB at this time.  The pt took a chlorthalidone dose today due to weight of 244 lbs. 01/28/14  Weight=  244 lbs.  [name] reports no changes in the pts right leg edema or his SOB at this time. The pt took a chlorthalidone dose today due to weight of 244 lbs 01/29/14  Weight= 246 lbs.  The pts wife, [name], states that the pt continues to have edema in his right leg and that he has mild SOB at this time.  The pt took a chlorthalidone dose today due to weight greater than 244 lbs.  01/30/14  Weight= 247 lbs.   The pts wife, [name], states that the pt continues to have edema in his right leg and that he has  mild SOB at this time.  The pt took a chlorthalidone dose today due to weight greater than 244 lbs.  01/31/14  Weight= 242 lbs. [name] reports no changes in the pts edema and that his SOB is better today. 02/01/14  Weight= 241 lbs.  [name] reports no changes in the pts edema or SOB at this time. 02/02/14  Weight= 241 lbs.  [name] reports no changes in the pts edema or SOB at this time. 02/03/14  Weight= 241 lbs. [name] reports no changes in the pts edema or SOB at this time. 02/04/14  Weight= 241 lbs.  [name] reports no changes in the pts edema or SOB at this time. *02/05/14  Home weight= 242 lbs  OV weight= 250 lbs. The pt had a OV with Dr [name] today. He was advised to increase Losartan to 50 mg daily and to f/u in 1 month. 02/06/14  Weight= 243 lbs. The pt reports no changes in his edema or SOB at this time. 02/07/14  Weight= 246 lbs.  The pt states that he continues to have right leg edema and moderate SOB at this time. He states that he took a Chlorthalidone **Note De-Identified [name] Obfuscation** dose today due to weight greater than 244 lbs.

## 2014-02-10 ENCOUNTER — Other Ambulatory Visit (HOSPITAL_BASED_OUTPATIENT_CLINIC_OR_DEPARTMENT_OTHER): Payer: Medicare Other | Admitting: Lab

## 2014-02-10 ENCOUNTER — Ambulatory Visit (HOSPITAL_BASED_OUTPATIENT_CLINIC_OR_DEPARTMENT_OTHER): Payer: Medicare Other | Admitting: Hematology & Oncology

## 2014-02-10 ENCOUNTER — Encounter: Payer: Self-pay | Admitting: Hematology & Oncology

## 2014-02-10 ENCOUNTER — Ambulatory Visit: Payer: Medicare Other

## 2014-02-10 VITALS — BP 135/83 | HR 51 | Temp 97.7°F | Resp 18 | Ht 72.0 in | Wt 256.0 lb

## 2014-02-10 DIAGNOSIS — D751 Secondary polycythemia: Secondary | ICD-10-CM

## 2014-02-10 DIAGNOSIS — R789 Finding of unspecified substance, not normally found in blood: Secondary | ICD-10-CM

## 2014-02-10 DIAGNOSIS — R0602 Shortness of breath: Secondary | ICD-10-CM

## 2014-02-10 DIAGNOSIS — N183 Chronic kidney disease, stage 3 unspecified: Secondary | ICD-10-CM

## 2014-02-10 DIAGNOSIS — E119 Type 2 diabetes mellitus without complications: Secondary | ICD-10-CM

## 2014-02-10 DIAGNOSIS — I482 Chronic atrial fibrillation: Secondary | ICD-10-CM

## 2014-02-10 DIAGNOSIS — I10 Essential (primary) hypertension: Secondary | ICD-10-CM

## 2014-02-10 LAB — CBC WITH DIFFERENTIAL (CANCER CENTER ONLY)
BASO#: 0 10*3/uL (ref 0.0–0.2)
BASO%: 0.3 % (ref 0.0–2.0)
EOS%: 1 % (ref 0.0–7.0)
Eosinophils Absolute: 0.1 10*3/uL (ref 0.0–0.5)
HEMATOCRIT: 48.8 % (ref 38.7–49.9)
HEMOGLOBIN: 16.9 g/dL (ref 13.0–17.1)
LYMPH#: 1.5 10*3/uL (ref 0.9–3.3)
LYMPH%: 16.2 % (ref 14.0–48.0)
MCH: 32.1 pg (ref 28.0–33.4)
MCHC: 34.6 g/dL (ref 32.0–35.9)
MCV: 93 fL (ref 82–98)
MONO#: 0.8 10*3/uL (ref 0.1–0.9)
MONO%: 8.6 % (ref 0.0–13.0)
NEUT#: 7 10*3/uL — ABNORMAL HIGH (ref 1.5–6.5)
NEUT%: 73.9 % (ref 40.0–80.0)
Platelets: 152 10*3/uL (ref 145–400)
RBC: 5.27 10*6/uL (ref 4.20–5.70)
RDW: 13.7 % (ref 11.1–15.7)
WBC: 9.4 10*3/uL (ref 4.0–10.0)

## 2014-02-10 LAB — CHCC SATELLITE - SMEAR

## 2014-02-10 NOTE — Progress Notes (Signed)
Referral MD  Reason for Referral: Erythrocytosis   Chief Complaint  Patient presents with  . NEW PATIENT  : I have something wrong with my blood  HPI: [name] is a very nice 73 year old gentleman. He has multiple medical problems. He has chronic atrial fibrillation. He's had a history of coronary artery bypass. He has a defibrillator. He has hypertension. He has insulin-dependent diabetes. He is on chronic anticoagulation. Every sees multiple doctors. He sees Dr.Katz of cardiology. He has noticed that there is been some elevated hemoglobin lately.  [name] apparently has had a decline in his cardiac function. An echocardiogram done recently showed a ejection fraction of only 25-30%. He has severe dilation of the left ventricle. There is increased wall thickness. He has severe left atrial dilation.  He's had a lot of shortness of breath. He really cannot do all that much without getting short of breath.  Dr. [name] was concerned about the elevated red cell count. As such, he kindly referred [name] to the [facility].  [name] has had no problems with weight loss or weight gain. He's had no change in medications. His blood sugars have been under decent control. He's had no change in bowel or bladder habits. He's had no leg swelling. He's had no rashes. He's had no pruritus. He's had no headache.  He's had multiple, multiple surgeries. Again, he has multiple medical prosper. If he does not smoke. He stopped smoking about 20 years ago. He does not drink regularly.  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 He's been told that he's had no problems from this so far. He has not had a CT of the chest for several years. A chest x-ray done last month showed pleural plaques related to a asbestos.  He gets around with a cane. He has a stimulator implanted to help with his back and leg pain.  Overall, his performance status is ECOG 1-2.    Past Medical History   Diagnosis Date  . Diabetes mellitus   . Hyperlipidemia   . Hypertension   . Pilonidal cyst   . Atrial fibrillation     Previous long-term amiodarone therapy with multiple cardioversions / amiodarone stopped September, 2009  . Atrial flutter     Started November, 2010, Left-sided and cannot ablate  . Left atrial thrombus     Remote past... cardioversions done since that time  . Wide-complex tachycardia   . Left ventricular ejection fraction less than 40%   . Gout   . AAA (abdominal aortic aneurysm)     Surgical repair  . Discolored skin   . S/P ICD (internal cardiac defibrillator) procedure     Dr. Lovena Le 2009... by the pacing  . SOB (shortness of breath)     Large left effusion/ thoracentesis/hospitalization/November, 2011... exudated.. cytology negative.. Dr.Wert.. no proof of mesothelioma  . Pericardial effusion   . Pleural effusion     Large loculated effusion on the left side November, 2011. This was tapped. It was exudative. Cytology revealed no cancer no proof of mesothelioma area pulmonary team felt that no further workup was needed  . S/P AAA repair   . Spinal stenosis     Surgery Dr.Elsner  . CAD (coronary artery disease)     Catheterization July, 2008... name and vein grafts patent but low cardiac output  . Warfarin anticoagulation   . Cardiomyopathy     Ischemic... ICD  . Combined systolic and diastolic CHF   .  Venous insufficiency     Toe discoloration chronic  . Mitral regurgitation     Mild echo  . Aortic valve sclerosis   . Nasal drainage     Chronic  . Alcohol ingestion of more than four drinks per week     Excess beer  not a dependency problem  . Chronotropic incompetence     IV pacing rate adjusted  . Thrombophlebitis of superficial veins of upper extremities     Possible venous stenosis from defibrillator  . Pericardial effusion     November, 2011 .. decreased during hospitalization  . Eye abnormality     Ophthalmologist questions a clot in one of  his eyes, May, 2012  . Overweight(278.02)     November, 2012  . Pleural thickening   . Ejection fraction     Ejection fraction has varied over time from 35-50%.,, Echoes are technically very difficult,,, EF 50%, echo, May 25, 2011, technically very difficult  . Drug therapy     Redness and swelling with Avelox infusion May 24, 2011  . COPD (chronic obstructive pulmonary disease)   . Myocardial infarction   . Spinal cord stimulator status     October, 2013  . Carotid artery disease     Doppler, December, 2013, 0-39% bilateral  . Pneumonia   . Arthritis   . Ventral hernia     April, 2014, result of his abdominal surgery  . Bony abnormality     Patient's manubrium is slightly displaced to the right  :  Past Surgical History  Procedure Laterality Date  . Colonoscopy w/ polypectomy    . Lumbar fusion    . Pilonidal cyst removal    . Surgery scrotal / testicular    . Coronary artery bypass graft  2004  . Abdominal aortic aneurysm repair  11/2002  . Bi-ventricular pacemaker insertion (crt-p)  02-11-2013    Pt with previously implanted MDT CRTD downgraded to CRTP by Dr Lovena Le 02-11-13  . Back surgery    . Incision and drainage abscess / hematoma of bursa / knee / thigh    :  Current outpatient prescriptions: ACCU-CHEK FASTCLIX LANCETS MISC, USE ONE LANCET TO CHECK BLOOD SUGAR THREE TIMES DAILY, Disp: 102 each, Rfl: 5;  allopurinol (ZYLOPRIM) 100 MG tablet, Take 1 tablet (100 mg total) by mouth daily., Disp: 90 tablet, Rfl: 3;  amLODipine (NORVASC) 5 MG tablet, Take 2.5 mg by mouth 2 (two) times daily., Disp: , Rfl: ;  aspirin 81 MG tablet, Take 81 mg by mouth once a week. , Disp: , Rfl:  atorvastatin (LIPITOR) 10 MG tablet, Take 1 tablet (10 mg total) by mouth daily., Disp: 90 tablet, Rfl: 3;  carvedilol (COREG) 6.25 MG tablet, Take 1 tablet (6.25 mg total) by mouth 2 (two) times daily., Disp: 180 tablet, Rfl: 1;  chlorthalidone (HYGROTON) 25 MG tablet, Take 12.5 mg by mouth daily. Take  when weight reaches 243 lbs. or greater only, Disp: 90 tablet, Rfl: 3 Cholecalciferol 10000 UNITS CAPS, Take 1,000 Units by mouth daily., Disp: , Rfl: ;  colchicine (COLCRYS) 0.6 MG tablet, Take 1 tablet (0.6 mg total) by mouth as needed (for gout)., Disp: 90 tablet, Rfl: 0;  furosemide (LASIX) 40 MG tablet, Take 2 tablets (80 mg total) by mouth 2 (two) times daily., Disp: 180 tablet, Rfl: 1 gabapentin (NEURONTIN) 100 MG capsule, Take 1 capsule (100 mg total) by mouth 3 (three) times daily., Disp: 270 capsule, Rfl: 0;  Insulin Detemir (LEVEMIR FLEXPEN) 100 UNIT/ML Pen,  Inject 35 Units into the skin at bedtime., Disp: 15 mL, Rfl: 3;  ipratropium (ATROVENT) 0.06 % nasal spray, Place 1 spray into both nostrils as directed. , Disp: , Rfl: ;  losartan (COZAAR) 50 MG tablet, Take 1 tablet (50 mg total) by mouth daily., Disp: 30 tablet, Rfl: 3 metFORMIN (GLUCOPHAGE-XR) 750 MG 24 hr tablet, TAKE ONE TABLET BY MOUTH ONCE DAILY WITH BREAKFAST, Disp: 30 tablet, Rfl: 5;  naphazoline-pheniramine (NAPHCON-A) 0.025-0.3 % ophthalmic solution, Place 1 drop into both eyes daily as needed for irritation., Disp: , Rfl: ;  NOVOLOG FLEXPEN 100 UNIT/ML SOPN FlexPen, Inject 12-14 Units into the skin 2 (two) times daily. , Disp: , Rfl:  Potassium Chloride ER 20 MEQ TBCR, Take 20 mcg by mouth 2 (two) times daily. TAKES 1 1/2 TAB = 30 MEQ.  TWICE A DAY   TOTAL IS 60 MEQ DAILY, Disp: , Rfl: ;  warfarin (COUMADIN) 2.5 MG tablet, TAKE AS DIRECTED BY ANTICOAGULATION CLINIC, Disp: 120 tablet, Rfl: 0;  nitroGLYCERIN (NITROSTAT) 0.4 MG SL tablet, Place 0.4 mg under the tongue every 5 (five) minutes as needed for chest pain (MAX 3 TABLETS). , Disp: , Rfl: :  :  Allergies  Allergen Reactions  . Avelox [Moxifloxacin Hcl In Nacl] Swelling, Rash and Other (See Comments)    Patient became hypotensive after infusion started Because of a history of documented adverse serious drug reaction;Medi Alert bracelet  is recommended  . Penicillins  Anaphylaxis    REACTION: anaphylaxis Because of a history of documented adverse serious drug reaction;Medi Alert bracelet  is recommended  :  Family History  Problem Relation Age of Onset  . Hypertension Mother   . Stroke Mother   . Diabetes Father   . Coronary artery disease Father   . Other Father     DVT  :  History   Social History  . Marital Status: Married    Spouse Name: N/A    Number of Children: N/A  . Years of Education: N/A   Occupational History  . Retired- Nurse, mental health    Social History Main Topics  . Smoking status: Former Smoker -- 3.00 packs/day for 39 years    Types: Cigarettes    Start date: 05/24/1956    Quit date: 03/22/1995  . Smokeless tobacco: Never Used     Comment: quit smoking 18 years ago  . Alcohol Use: 1.2 oz/week    2 Cans of beer per week     Comment: beer  . Drug Use: No  . Sexual Activity: No   Other Topics Concern  . Not on file   Social History Narrative  :  Pertinent items are noted in HPI.  Exam: _0 @  fairly well-developed and well-nourished white gentleman in no obvious distress. Vital signs show temperature of 97.7. Pulse 51. Blood pressure 135/83. Weight is 256 pounds. Head and neck exam shows a normocephalic and atraumatic skull. There is no ocular or oral lesions. He has no conjunctival inflammation. He has no adenopathy in the neck. Lungs are clear bilaterally. Cardiac exam regular rate and irregular rhythm assistant with atrial fibrillation. He has a 1/6 systolic ejection murmur. Abdomen is soft. He has good bowel sounds. There is no fluid wave. There is no palpable liver or spleen tip. Back exam shows no tenderness over the spine, ribs or hips. Extremities shows no clubbing, cyanosis or edema. Skin exam shows some slight ruddy complexion. Neurological exam is nonfocal.   Recent Labs  02/10/14 1457  WBC  9.4  HGB 16.9  HCT 48.8  PLT 152   No results for input(s): NA, K, CL, CO2, GLUCOSE, BUN,  CREATININE, CALCIUM in the last 72 hours.  Blood smear review: Normochromic and normocytic population of red blood cells. There is no anisocytosis or poikilocytosis. There is no nucleated red blood cells. I see no schistocytes or spherocytes. There is no rouleau formation. I see no target cells. White cells per normal in morphology maturation. There is no immature myeloid or lymphoid forms. Platelets are adequate number size.  Pathology: None     Assessment and Plan: [name] is a 73 year old white gentleman. He has multiple medical problems. He is on multiple medications. His blood count is okay today. He has no obvious erythrocytosis. I'm not sure as to why his blood count was up a little bit.  I suppose that he may be hyperviscous. This may cause some of his breathing difficulties.  I'll be more worried about his exposure to asbestos. I wonder if he may not have some lung damage from this. He may need to have a CT scan. However, I will leave this to his other doctors to decide this.  We will send off a JAK2 assay. I will send iron studies. I will send off an erythropoietin level on him.  If I think that he may have polycythemia, then we can certainly phlebotomize him and see if we cannot make him feel better.  I do not see a need for a bone marrow biopsy on him.   Again, asbestosis may be a problem. I will leave this to his other doctors to decide this.  I will call him and let him know if we have to do anything else. I spent about 45-50 minutes with he and his wife. I answered all the questions. I went over his lab work with them.

## 2014-02-11 LAB — FERRITIN CHCC: Ferritin: 155 ng/ml (ref 22–316)

## 2014-02-11 LAB — IRON AND TIBC CHCC
%SAT: 23 % (ref 20–55)
Iron: 74 ug/dL (ref 42–163)
TIBC: 328 ug/dL (ref 202–409)
UIBC: 254 ug/dL (ref 117–376)

## 2014-02-11 NOTE — Telephone Encounter (Signed)
**Note De-Identified Serenna Deroy Obfuscation** The pts wife, Park, called to report the pts weight for today.

## 2014-02-11 NOTE — Telephone Encounter (Signed)
Follow Up  Pt wife returned call//sr

## 2014-02-14 LAB — HEMOGLOBINOPATHY EVALUATION
Hemoglobin Other: 0 %
Hgb A2 Quant: 2.6 % (ref 2.2–3.2)
Hgb A: 97.4 % (ref 96.8–97.8)
Hgb F Quant: 0 % (ref 0.0–2.0)
Hgb S Quant: 0 %

## 2014-02-14 LAB — ERYTHROPOIETIN: Erythropoietin: 23.7 m[IU]/mL — ABNORMAL HIGH (ref 2.6–18.5)

## 2014-02-14 LAB — COMPREHENSIVE METABOLIC PANEL
ALBUMIN: 3.7 g/dL (ref 3.5–5.2)
ALT: 10 U/L (ref 0–53)
AST: 12 U/L (ref 0–37)
Alkaline Phosphatase: 73 U/L (ref 39–117)
BUN: 24 mg/dL — AB (ref 6–23)
CALCIUM: 9 mg/dL (ref 8.4–10.5)
CHLORIDE: 105 meq/L (ref 96–112)
CO2: 24 mEq/L (ref 19–32)
Creatinine, Ser: 1.56 mg/dL — ABNORMAL HIGH (ref 0.50–1.35)
Glucose, Bld: 104 mg/dL — ABNORMAL HIGH (ref 70–99)
POTASSIUM: 3.8 meq/L (ref 3.5–5.3)
Sodium: 140 mEq/L (ref 135–145)
Total Bilirubin: 0.7 mg/dL (ref 0.2–1.2)
Total Protein: 6.2 g/dL (ref 6.0–8.3)

## 2014-02-14 LAB — RETICULOCYTES (CHCC)
ABS Retic: 63.8 10*3/uL (ref 19.0–186.0)
RBC.: 5.32 MIL/uL (ref 4.22–5.81)
Retic Ct Pct: 1.2 % (ref 0.4–2.3)

## 2014-02-14 LAB — VITAMIN B12: Vitamin B-12: 311 pg/mL (ref 211–911)

## 2014-02-14 LAB — LACTATE DEHYDROGENASE: LDH: 169 U/L (ref 94–250)

## 2014-02-17 NOTE — Telephone Encounter (Signed)
**Note De-Identified Torre Schaumburg Obfuscation** The pts wife called to report the pts weight.

## 2014-02-17 NOTE — Telephone Encounter (Signed)
Follow Up  Pt returning call, please call back and discuss

## 2014-02-18 NOTE — Telephone Encounter (Signed)
02/08/14  Weight= 243 lbs The pts wife, Lisle, states that the pt continues to have right leg swelling and increased sob at this time. 02/09/14  Weight= 243 lbs Christphor reports no changes in the pts edema or sob at this time. 02/10/14  Weight= 244 lbs  Thailan reports no changes in the pts edema or sob at this time. The pt took a Chlorthalidone dose today due to weight of 244 lbs. 02/11/14  Weight= 245 lbs  Boysie reports no changes in the pts edema or sob at this time. The pt took a Chlorthalidone dose today due to weight greater than 244 lbs. 02/12/14  Weight= 245 lbs  Dima reports no changes in the pts edema or sob at this time. The pt took a Chlorthalidone dose today due to weight greater than 244 lbs. 02/13/14  Weight= 239 lbs  Arne reports no changes in the pts edema or sob at this time. 02/14/14  Weight= 241 lbs  Muhammed reports no changes in the pts edema or sob at this time. 02/15/14  Weight= 241 lbs  Akai reports no changes in the pts edema or sob at this time. 02/16/14  Weight= 239 lbs  Smayan reports no changes in the pts edema or sob at this time. 02/17/14  Weight=241 lbs   Nicholous reports no changes in the pts edema or sob at this time.

## 2014-02-20 ENCOUNTER — Encounter: Payer: Self-pay | Admitting: Hematology & Oncology

## 2014-02-20 NOTE — Telephone Encounter (Signed)
I left a telephone message for [name]. I told him that the blood work is borderline for whether not he does have polycythemia. His JAK2 assay is negative. However, his erythropoietin level is on the lower side. He is not iron deficient.  I think the best way for [place] to try to get him to feel better might be to phlebotomize him. We saw him, his hematocrit was about 50. I think if we got him on a phlebotomy program, and try to get his hematocrit below 45, then we can see this makes him feel better.  It is possible that he just may have sluggish blood flow and if so, getting blood out a him will improve in the this cost of the parameters.  I told him to give [place] a call when he gets back into town and we will set things up for a phlebotomy. I told that it only takes about an hour to do the phlebotomy.  I said to give [place] a call with any questions and again when he gets back into town.  Laurey Arrow

## 2014-02-25 ENCOUNTER — Other Ambulatory Visit: Payer: Self-pay | Admitting: Internal Medicine

## 2014-02-25 DIAGNOSIS — R0600 Dyspnea, unspecified: Secondary | ICD-10-CM

## 2014-02-26 ENCOUNTER — Ambulatory Visit (INDEPENDENT_AMBULATORY_CARE_PROVIDER_SITE_OTHER): Payer: Medicare Other | Admitting: Internal Medicine

## 2014-02-26 ENCOUNTER — Telehealth: Payer: Self-pay | Admitting: Cardiology

## 2014-02-26 ENCOUNTER — Encounter: Payer: Self-pay | Admitting: Internal Medicine

## 2014-02-26 ENCOUNTER — Telehealth: Payer: Self-pay | Admitting: Internal Medicine

## 2014-02-26 VITALS — BP 118/76 | HR 86 | Ht 71.5 in | Wt 248.0 lb

## 2014-02-26 DIAGNOSIS — J929 Pleural plaque without asbestos: Secondary | ICD-10-CM

## 2014-02-26 DIAGNOSIS — R0600 Dyspnea, unspecified: Secondary | ICD-10-CM

## 2014-02-26 DIAGNOSIS — R0602 Shortness of breath: Secondary | ICD-10-CM

## 2014-02-26 LAB — PULMONARY FUNCTION TEST
DL/VA % pred: 81 %
DL/VA: 3.81 ml/min/mmHg/L
DLCO unc % pred: 56 %
DLCO unc: 19.43 ml/min/mmHg
FEF 25-75 Post: 2.83 L/sec
FEF 25-75 Pre: 2.29 L/sec
FEF2575-%CHANGE-POST: 23 %
FEF2575-%Pred-Post: 114 %
FEF2575-%Pred-Pre: 92 %
FEV1-%CHANGE-POST: 5 %
FEV1-%PRED-PRE: 76 %
FEV1-%Pred-Post: 80 %
FEV1-POST: 2.68 L
FEV1-Pre: 2.55 L
FEV1FVC-%CHANGE-POST: 1 %
FEV1FVC-%Pred-Pre: 107 %
FEV6-%Change-Post: 3 %
FEV6-%Pred-Post: 78 %
FEV6-%Pred-Pre: 75 %
FEV6-PRE: 3.26 L
FEV6-Post: 3.38 L
FEV6FVC-%Change-Post: 0 %
FEV6FVC-%PRED-PRE: 106 %
FEV6FVC-%Pred-Post: 105 %
FVC-%Change-Post: 3 %
FVC-%Pred-Post: 74 %
FVC-%Pred-Pre: 71 %
FVC-Post: 3.4 L
FVC-Pre: 3.27 L
POST FEV1/FVC RATIO: 79 %
POST FEV6/FVC RATIO: 100 %
Pre FEV1/FVC ratio: 78 %
Pre FEV6/FVC Ratio: 100 %
RV % pred: 112 %
RV: 2.92 L
TLC % PRED: 86 %
TLC: 6.35 L

## 2014-02-26 NOTE — Patient Instructions (Addendum)
You have no copd or evidence of asbestosis although clearly you have been exposed and caused scarring on the surface lining only  GERD (REFLUX)  is an extremely common cause of respiratory symptoms just like yours , many times with no obvious heartburn at all.    It can be treated with medication, but also with lifestyle changes including avoidance of late meals, excessive alcohol, smoking cessation, and avoid fatty foods, chocolate, peppermint, colas, red wine, and acidic juices such as orange juice.  NO MINT OR MENTHOL PRODUCTS SO NO COUGH DROPS  USE SUGARLESS CANDY INSTEAD (Jolley ranchers or Stover's or Life Savers) or even ice chips will also do - the key is to swallow to prevent all throat clearing. NO OIL BASED VITAMINS - use powdered substitutes.    I recommend yearly cxr and weight loss but no pulmonary medications   Pulmonary follow up is as needed

## 2014-02-26 NOTE — Progress Notes (Signed)
PFT done today. 

## 2014-02-26 NOTE — Progress Notes (Signed)
Subjective:    Patient ID: [name], male    DOB: 1940/04/06   MRN: [id number]    Brief patient profile:  73 yowm quit smoking around 1995 with some coughing then but completely better then slowed down due to back since 2010 then abruptly worse mid Oct 2015 with documented chf followed by [name] with EF around 30% 01/20/14 referred to pulmonary clinic with asbestos changes on cxr and unexplained resting sob on 01/22/14 with pfts 02/26/14 showing mild restrictive changes only    History of Present Illness  01/22/2014  First ov/ consultation per [name]  Chief Complaint  Patient presents with  . Pulmonary Consult    Referred by Dr. Dola Argyle. Pt c/o SOB for the past 3 wks- "nothing consistant"- comes and goes with or without exertion. He states gets SOB walking minmal distance such as from room to room at home.   abrupt onset x 3 weeks sob at rest only sitting s warning, lasts up to one minute resolves on its own but also reproducible x 50 ft When I asked again the same questions re timing onset I got a different hx from husband and wife:  Pattern of sob at rest goes back sev years as does the doe, just worse x 3 weeks and never present sleeping flat  Episodes of sob at rest occur randomly during the day and are not related to meals/ speaking/ up  to 3-4 x per day never last over a minute, sometimes just a few breaths  rec Try prilosec 20mg   Take 30-60 min before first meal of the day and Pepcid 20 mg one bedtime  GERD diet   02/26/2014 f/u ov/Miciah Shealy re: unexplained sobat rest and doe x 50 ft (not reproducible at last ov) Chief Complaint  Patient presents with  . Follow-up    PFT done today. Pt states that his breathing is "about the same, maybe a little better". No new co's today.       No obvious day to day or daytime variabilty or assoc chronic cough or cp or chest tightness, subjective wheeze overt sinus or hb symptoms. No unusual exp hx or h/o childhood pna/ asthma or  knowledge of premature birth.  Sleeping ok without nocturnal  or early am exacerbation  of respiratory  c/o's or need for noct saba. Also denies any obvious fluctuation of symptoms with weather or environmental changes or other aggravating or alleviating factors except as outlined above   Current Medications, Allergies, Complete Past Medical History, Past Surgical History, Family History, and Social History were reviewed in [employer] record.  ROS  The following are not active complaints unless bolded sore throat, dysphagia, dental problems, itching, sneezing,  nasal congestion or excess/ purulent secretions, ear ache,   fever, chills, sweats, unintended wt loss, pleuritic or exertional cp, hemoptysis,  orthopnea pnd or leg swelling, presyncope, palpitations, heartburn, abdominal pain, anorexia, nausea, vomiting, diarrhea  or change in bowel or urinary habits, change in stools or urine, dysuria,hematuria,  rash, arthralgias/back pain, visual complaints, headache, numbness weakness or ataxia or problems with walking or coordination,  change in mood/affect or memory.       .             Objective:   Physical Exam  slt hoarse stoic wm nad  02/26/2014        248  Wt Readings from Last 3 Encounters:  01/22/14 252 lb (114.306 kg)  01/22/14 253 lb (114.76  kg)  01/13/14 252 lb (114.306 kg)    Vital signs reviewed   HEENT: nl dentition, turbinates, and orophanx. Nl external ear canals without cough reflex   NECK :  without JVD/Nodes/TM/ nl carotid upstrokes bilaterally   LUNGS: no acc muscle use, clear to A and P bilaterally without cough on insp or exp maneuvers   CV:  RRR  no s3 or murmur or increase in P2, no edema   ABD:  soft and nontender with nl excursion in the supine position. No bruits or organomegaly, bowel sounds nl  MS:  warm without deformities, calf tenderness, cyanosis or clubbing  SKIN: warm and dry without lesions    NEURO:  alert, approp,  no deficits    01/01/14 cxr 1. Chronic cardiomegaly without failure. 2. 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       Assessment & Plan:

## 2014-02-26 NOTE — Telephone Encounter (Signed)
New Message  Pt called requests a call back to discuss her husbands appt/please call

## 2014-02-26 NOTE — Telephone Encounter (Signed)
error 

## 2014-02-27 ENCOUNTER — Encounter (HOSPITAL_COMMUNITY): Payer: Self-pay | Admitting: Internal Medicine

## 2014-02-27 NOTE — Telephone Encounter (Signed)
follow up in 6 months with Dr. Lovena Le.  LMOM for wife

## 2014-02-28 NOTE — Assessment & Plan Note (Signed)
Present since at least 11/ 2011 CT  - see pfts 02/26/14  VC 3.43 (75%) with dlco 56% and corrects to 81%   No evidence of asbestosis/ ILD / rec f/u yearly cxr's

## 2014-02-28 NOTE — Assessment & Plan Note (Addendum)
October, 2015 worse but present for years per pt/ wife - 01/22/2014   Walked RA x one lap @ 185 stopped due to  Back pain, no sob albeit slow pace  - PFTs s obst 02/26/14  Mild restrictive changes ? Obesity main issue   I had an extended discussion with the patient reviewing all relevant studies completed to date and  lasting 15 to 20 minutes of a 25 minute visit on the following ongoing concerns:   [name] is limited by obesity/ back pain much more than sob and this is reflected in his pfts  His sob at rest is likely anxiety since comes and goes so quickly on its own and not reproducible or made worse walking as far as I can tell by his hx and what we could observe here - ok ok to stop otcGERD meds if [name] doesn't think they are helping and has no HB  No pulmonary rx or f/u  Needed at this point  > reviewed GERD  diet/ wt loss issues

## 2014-03-03 ENCOUNTER — Other Ambulatory Visit (INDEPENDENT_AMBULATORY_CARE_PROVIDER_SITE_OTHER): Payer: Medicare Other

## 2014-03-03 ENCOUNTER — Other Ambulatory Visit: Payer: Self-pay | Admitting: *Deleted

## 2014-03-03 DIAGNOSIS — I4891 Unspecified atrial fibrillation: Secondary | ICD-10-CM

## 2014-03-03 DIAGNOSIS — E1151 Type 2 diabetes mellitus with diabetic peripheral angiopathy without gangrene: Secondary | ICD-10-CM

## 2014-03-03 DIAGNOSIS — IMO0002 Reserved for concepts with insufficient information to code with codable children: Secondary | ICD-10-CM

## 2014-03-03 DIAGNOSIS — E1159 Type 2 diabetes mellitus with other circulatory complications: Secondary | ICD-10-CM

## 2014-03-03 DIAGNOSIS — Z7901 Long term (current) use of anticoagulants: Secondary | ICD-10-CM

## 2014-03-03 DIAGNOSIS — E1165 Type 2 diabetes mellitus with hyperglycemia: Secondary | ICD-10-CM

## 2014-03-03 LAB — LIPID PANEL
CHOL/HDL RATIO: 4
CHOLESTEROL: 172 mg/dL (ref 0–200)
HDL: 42.2 mg/dL (ref >39.00)
LDL Cholesterol: 109 mg/dL — ABNORMAL HIGH (ref 0–99)
NonHDL: 129.8
Triglycerides: 106 mg/dL (ref 0.0–149.0)
VLDL: 21.2 mg/dL (ref 0.0–40.0)

## 2014-03-03 LAB — COMPREHENSIVE METABOLIC PANEL
ALBUMIN: 3.8 g/dL (ref 3.5–5.2)
ALT: 8 U/L (ref 0–53)
AST: 13 U/L (ref 0–37)
Alkaline Phosphatase: 62 U/L (ref 39–117)
BUN: 29 mg/dL — AB (ref 6–23)
CALCIUM: 9.2 mg/dL (ref 8.4–10.5)
CHLORIDE: 103 meq/L (ref 96–112)
CO2: 26 mEq/L (ref 19–32)
Creatinine, Ser: 1.5 mg/dL (ref 0.4–1.5)
GFR: 49.93 mL/min — ABNORMAL LOW (ref >60.00)
GLUCOSE: 118 mg/dL — AB (ref 70–99)
POTASSIUM: 3.6 meq/L (ref 3.5–5.1)
Sodium: 138 mEq/L (ref 135–145)
Total Bilirubin: 0.7 mg/dL (ref 0.2–1.2)
Total Protein: 6.6 g/dL (ref 6.0–8.3)

## 2014-03-03 LAB — HEMOGLOBIN A1C: Hgb A1c MFr Bld: 7.1 % — ABNORMAL HIGH (ref 4.6–6.5)

## 2014-03-04 ENCOUNTER — Encounter: Payer: Medicare Other | Admitting: Internal Medicine

## 2014-03-06 ENCOUNTER — Ambulatory Visit (INDEPENDENT_AMBULATORY_CARE_PROVIDER_SITE_OTHER): Payer: Medicare Other | Admitting: Endocrinology

## 2014-03-06 ENCOUNTER — Encounter: Payer: Self-pay | Admitting: Endocrinology

## 2014-03-06 ENCOUNTER — Other Ambulatory Visit: Payer: Self-pay | Admitting: *Deleted

## 2014-03-06 VITALS — BP 131/70 | HR 67 | Temp 98.0°F | Resp 16 | Ht 71.5 in | Wt 254.2 lb

## 2014-03-06 DIAGNOSIS — E785 Hyperlipidemia, unspecified: Secondary | ICD-10-CM

## 2014-03-06 DIAGNOSIS — E1149 Type 2 diabetes mellitus with other diabetic neurological complication: Secondary | ICD-10-CM

## 2014-03-06 DIAGNOSIS — E119 Type 2 diabetes mellitus without complications: Secondary | ICD-10-CM

## 2014-03-06 DIAGNOSIS — E876 Hypokalemia: Secondary | ICD-10-CM

## 2014-03-06 MED ORDER — INSULIN LISPRO 100 UNIT/ML (KWIKPEN)
12.0000 [IU] | PEN_INJECTOR | Freq: Two times a day (BID) | SUBCUTANEOUS | Status: DC
Start: 2014-03-06 — End: 2014-09-05

## 2014-03-06 MED ORDER — GABAPENTIN 300 MG PO CAPS: ORAL_CAPSULE | ORAL | Status: DC

## 2014-03-06 NOTE — Patient Instructions (Signed)
10 units Humalog 10-15 min before meals

## 2014-03-06 NOTE — Progress Notes (Signed)
Patient ID: [name], male   DOB: 1940-05-13, 73 y.o.   MRN: [id number]    Reason for Appointment : F/u for Type 2 Diabetes  History of Present Illness          Diagnosis: Type 2 diabetes mellitus, date of diagnosis: 2008       Past history: He was initially treated with Metformin and also given Amayl at some point. With this he had fair control of his diabetes with A1c had usually been over 7% except once in 2013.  He was taken off metformin probably in March this year after a hospitalization, possibly because of renal dysfunction He was started on Levemir insulin in May when his blood sugars were significantly higher, fasting readings averaging 220.  Levemir progressively increased and was on 35 units at night since 12/20/12 He has not been on any other medications either orally or injectable for his diabetes Because of his A1c of over 10% in 10/14 on basal insulin alone he was started on NovoLog with each meal in 10/14  Recent history:    Has been taking NovoLog 12 units before his 2 meals and postprandial readings have been better controlled recently Although his A1c is still over 7% his home blood sugars appear better than on his last visit and averaging only 112 No hypoglycemia with this change He is pretty consistent with checking his blood sugars about twice a day on an average including after meals  well controlled A1c has been below 8% and on this visit relatively better at 7.3 He has been inconsistent in taking his blood sugars at various times including after meals Does not usually breakfast He has limited ability to lose weight partly because of difficulty with exercise\ His wife is usually helping him watch his diet  INSULIN regimen is described as:  35 Levemir hs NovoLog 12 units ac  Glucose monitoring:  done 3 times a day        Glucometer:  Accu-Chek    Blood Glucose readings from his diary:  PRE-MEAL Breakfast pc Lunch  PCS  Bedtime Overall  Glucose range:   80-122   89-182   97-161    80-182   Mean/median: 104 125  112  112     Hypoglycemia frequency:  none.           Self-care: The diet that the patient has been following is: Smaller portions and relatively balanced meals  Meals: 2 meals per day. Noon and 6 pm  Low fat, trying to eat more salads. Avoiding drinks with sugar and no juices       Physical activity: exercise: Minimal, limited by back pain           Dietician visit: Most recent: Unknown.  CDE visit: 11/14               Compliance with the medical regimen: Good  Retinal exam: Most recent: 6/14, reportedly normal  Weight control: This appears to be fluctuating significantly  Wt Readings from Last 3 Encounters:  03/06/14 254 lb 3.2 oz (115.304 kg)  02/26/14 248 lb (112.492 kg)  02/10/14 256 lb (116.121 kg)    Lab Results  Component Value Date   HGBA1C 7.1* 03/03/2014   HGBA1C 7.3* 12/02/2013   HGBA1C 7.7* 07/02/2013   Lab Results  Component Value Date   MICROALBUR 0.5 02/01/2013   LDLCALC 109* 03/03/2014   CREATININE 1.5 03/03/2014       Medication List  This list is accurate as of: 03/06/14  1:49 PM.  Always use your most recent med list.               ACCU-CHEK FASTCLIX LANCETS Misc  USE ONE LANCET TO CHECK BLOOD SUGAR THREE TIMES DAILY     ACCU-CHEK SMARTVIEW test strip  Generic drug:  glucose blood     allopurinol 100 MG tablet  Commonly known as:  ZYLOPRIM  Take 1 tablet (100 mg total) by mouth daily.     amLODipine 5 MG tablet  Commonly known as:  NORVASC  Take 2.5 mg by mouth 2 (two) times daily.     aspirin 81 MG tablet  Take 81 mg by mouth once a week.     atorvastatin 10 MG tablet  Commonly known as:  LIPITOR  Take 1 tablet (10 mg total) by mouth daily.     carvedilol 6.25 MG tablet  Commonly known as:  COREG  Take 1 tablet (6.25 mg total) by mouth 2 (two) times daily.     chlorthalidone 25 MG tablet  Commonly known as:  HYGROTON  Take 12.5 mg by mouth daily. Take when  weight reaches 243 lbs. or greater only     Cholecalciferol 10000 UNITS Caps  Take 1,000 Units by mouth daily.     colchicine 0.6 MG tablet  Commonly known as:  COLCRYS  Take 1 tablet (0.6 mg total) by mouth as needed (for gout).     furosemide 40 MG tablet  Commonly known as:  LASIX  Take 2 tablets (80 mg total) by mouth 2 (two) times daily.     gabapentin 100 MG capsule  Commonly known as:  NEURONTIN  Take 1 capsule (100 mg total) by mouth 3 (three) times daily.     Insulin Detemir 100 UNIT/ML Pen  Commonly known as:  LEVEMIR FLEXPEN  Inject 35 Units into the skin at bedtime.     ipratropium 0.06 % nasal spray  Commonly known as:  ATROVENT  Place 1 spray into both nostrils as directed.     losartan 50 MG tablet  Commonly known as:  COZAAR  Take 1 tablet (50 mg total) by mouth daily.     metFORMIN 750 MG 24 hr tablet  Commonly known as:  GLUCOPHAGE-XR  TAKE ONE TABLET BY MOUTH ONCE DAILY WITH BREAKFAST     naphazoline-pheniramine 0.025-0.3 % ophthalmic solution  Commonly known as:  NAPHCON-A  Place 1 drop into both eyes daily as needed for irritation.     nitroGLYCERIN 0.4 MG SL tablet  Commonly known as:  NITROSTAT  Place 0.4 mg under the tongue every 5 (five) minutes as needed for chest pain (MAX 3 TABLETS).     NOVOLOG FLEXPEN 100 UNIT/ML FlexPen  Generic drug:  insulin aspart  Inject 12-14 Units into the skin 2 (two) times daily.     Potassium Chloride ER 20 MEQ Tbcr  Take 20 mcg by mouth 2 (two) times daily. TAKES 1 1/2 TAB = 30 MEQ.  TWICE A DAY   TOTAL IS 60 MEQ DAILY     warfarin 2.5 MG tablet  Commonly known as:  COUMADIN  TAKE AS DIRECTED BY ANTICOAGULATION CLINIC        Allergies:  Allergies  Allergen Reactions  . Avelox [Moxifloxacin Hcl In Nacl] Swelling, Rash and Other (See Comments)    Patient became hypotensive after infusion started Because of a history of documented adverse serious drug reaction;Medi Alert bracelet  is recommended  .  Penicillins Anaphylaxis    REACTION: anaphylaxis Because of a history of documented adverse serious drug reaction;Medi Alert bracelet  is recommended    Past Medical History  Diagnosis Date  . Diabetes mellitus   . Hyperlipidemia   . Hypertension   . Pilonidal cyst   . Atrial fibrillation     Previous long-term amiodarone therapy with multiple cardioversions / amiodarone stopped September, 2009  . Atrial flutter     Started November, 2010, Left-sided and cannot ablate  . Left atrial thrombus     Remote past... cardioversions done since that time  . Wide-complex tachycardia   . Left ventricular ejection fraction less than 40%   . Gout   . AAA (abdominal aortic aneurysm)     Surgical repair  . Discolored skin   . S/P ICD (internal cardiac defibrillator) procedure     Dr. Lovena Le 2009... by the pacing  . SOB (shortness of breath)     Large left effusion/ thoracentesis/hospitalization/November, 2011... exudated.. cytology negative.. Dr.Wert.. no proof of mesothelioma  . Pericardial effusion   . Pleural effusion     Large loculated effusion on the left side November, 2011. This was tapped. It was exudative. Cytology revealed no cancer no proof of mesothelioma area pulmonary team felt that no further workup was needed  . S/P AAA repair   . Spinal stenosis     Surgery Dr.Elsner  . CAD (coronary artery disease)     Catheterization July, 2008... name and vein grafts patent but low cardiac output  . Warfarin anticoagulation   . Cardiomyopathy     Ischemic... ICD  . Combined systolic and diastolic CHF   . Venous insufficiency     Toe discoloration chronic  . Mitral regurgitation     Mild echo  . Aortic valve sclerosis   . Nasal drainage     Chronic  . Alcohol ingestion of more than four drinks per week     Excess beer  not a dependency problem  . Chronotropic incompetence     IV pacing rate adjusted  . Thrombophlebitis of superficial veins of upper extremities     Possible  venous stenosis from defibrillator  . Pericardial effusion     November, 2011 .. decreased during hospitalization  . Eye abnormality     Ophthalmologist questions a clot in one of his eyes, May, 2012  . Overweight(278.02)     November, 2012  . Pleural thickening   . Ejection fraction     Ejection fraction has varied over time from 35-50%.,, Echoes are technically very difficult,,, EF 50%, echo, May 25, 2011, technically very difficult  . Drug therapy     Redness and swelling with Avelox infusion May 24, 2011  . COPD (chronic obstructive pulmonary disease)   . Myocardial infarction   . Spinal cord stimulator status     October, 2013  . Carotid artery disease     Doppler, December, 2013, 0-39% bilateral  . Pneumonia   . Arthritis   . Ventral hernia     April, 2014, result of his abdominal surgery  . Bony abnormality     Patient's manubrium is slightly displaced to the right    Past Surgical History  Procedure Laterality Date  . Colonoscopy w/ polypectomy    . Lumbar fusion    . Pilonidal cyst removal    . Surgery scrotal / testicular    . Coronary artery bypass graft  2004  . Abdominal aortic aneurysm repair  11/2002  .  Bi-ventricular pacemaker insertion (crt-p)  02-11-2013    Pt with previously implanted MDT CRTD downgraded to CRTP by Dr Lovena Le 02-11-13  . Back surgery    . Incision and drainage abscess / hematoma of bursa / knee / thigh    . Implantable cardioverter defibrillator (icd) generator change N/A 02/11/2013    Procedure: ICD GENERATOR CHANGE;  Surgeon: Evans Lance, MD;  Location: [facility] CATH LAB;  Service: Cardiovascular;  Laterality: N/A;    Family History  Problem Relation Age of Onset  . Hypertension Mother   . Stroke Mother   . Diabetes Father   . Coronary artery disease Father   . Other Father     DVT    Social History:  reports that he quit smoking about 18 years ago. His smoking use included Cigarettes. He started smoking about 57 years ago. He has  a 117 pack-year smoking history. He has never used smokeless tobacco. He reports that he drinks about 1.2 oz of alcohol per week. He reports that he does not use illicit drugs.    Review of Systems       Lipids: Treated with Lipitor, currently only taking 10 mg Hyperlipidemia: LDL is still over 100 although HDL 45 and triglycerides below 200, he is being managed by PCP/cardiologist   Lab Results  Component Value Date   CHOL 172 03/03/2014   HDL 42.20 03/03/2014   LDLCALC 109* 03/03/2014   TRIG 106.0 03/03/2014   CHOLHDL 4 03/03/2014       The blood pressure has been under control with current regimen of chlorthalidone, amlodipine and losartan, BP at home usually normal Also he takes extra chlorthalidone when he has weight gain  Renal dysfunction: Creatinine is stable at 1.5    Has known neuropathy:    He has had pains and tingling in his feet and lower legs.  Mostly having some symptoms in day; the tops of his feet hurt when he starts walking He is taking gabapentin as directed but current regimen of extra 100 mg does not help as much   HYPOKALEMIA: Appears improved  He is  on 20 mg twice a day of potassium but is also on furosemide on and off and chlorthalidone. No muscle cramps   LABS:  Appointment on 03/03/2014  Component Date Value Ref Range Status  . Hgb A1c MFr Bld 03/03/2014 7.1* 4.6 - 6.5 % Final   Glycemic Control Guidelines for People with Diabetes:Non Diabetic:  <6%Goal of Therapy: <7%Additional Action Suggested:  >8%   . Sodium 03/03/2014 138  135 - 145 mEq/L Final  . Potassium 03/03/2014 3.6  3.5 - 5.1 mEq/L Final  . Chloride 03/03/2014 103  96 - 112 mEq/L Final  . CO2 03/03/2014 26  19 - 32 mEq/L Final  . Glucose, Bld 03/03/2014 118* 70 - 99 mg/dL Final  . BUN 03/03/2014 29* 6 - 23 mg/dL Final  . Creatinine, Ser 03/03/2014 1.5  0.4 - 1.5 mg/dL Final  . Total Bilirubin 03/03/2014 0.7  0.2 - 1.2 mg/dL Final  . Alkaline Phosphatase 03/03/2014 62  39 - 117 U/L  Final  . AST 03/03/2014 13  0 - 37 U/L Final  . ALT 03/03/2014 8  0 - 53 U/L Final  . Total Protein 03/03/2014 6.6  6.0 - 8.3 g/dL Final  . Albumin 03/03/2014 3.8  3.5 - 5.2 g/dL Final  . Calcium 03/03/2014 9.2  8.4 - 10.5 mg/dL Final  . GFR 03/03/2014 49.93* >60.00 mL/min Final  .  Cholesterol 03/03/2014 172  0 - 200 mg/dL Final   ATP III Classification       Desirable:  < 200 mg/dL               Borderline High:  200 - 239 mg/dL          High:  > = 240 mg/dL  . Triglycerides 03/03/2014 106.0  0.0 - 149.0 mg/dL Final   Normal:  <150 mg/dLBorderline High:  150 - 199 mg/dL  . HDL 03/03/2014 42.20  >39.00 mg/dL Final  . VLDL 03/03/2014 21.2  0.0 - 40.0 mg/dL Final  . LDL Cholesterol 03/03/2014 109* 0 - 99 mg/dL Final  . Total CHOL/HDL Ratio 03/03/2014 4   Final                  Men          Women1/2 Average Risk     3.4          3.3Average Risk          5.0          4.42X Average Risk          9.6          7.13X Average Risk          15.0          11.0                      . NonHDL 03/03/2014 129.80   Final   NOTE:  Non-HDL goal should be 30 mg/dL higher than patient's LDL goal (i.e. LDL goal of < 70 mg/dL, would have non-HDL goal of < 100 mg/dL)    Physical Examination:  BP 131/70 mmHg  Pulse 67  Temp(Src) 98 F (36.7 C)  Resp 16  Ht 5' 11.5" (1.816 m)  Wt 254 lb 3.2 oz (115.304 kg)  BMI 34.96 kg/m2  SpO2 98%  No ankle edema    ASSESSMENT:  Diabetes type 2  His blood sugar control is good although A1c still  not below 7% More recently he is having excellent blood sugars compared to his previous visits and sometimes has low normal readings after his evening meal He usually does not adjust his Humalog based on his meal size He may be doing better on his diet that has helped his postprandial control Fasting readings have been better with increasing his Levemir on the last visit Overall compliance is excellent but still not able to lose weight No recent hypoglycemia    Hypokalemia: This is improved currently and will continue current regimen  Mild CKD: Creatinine stable at 1.5, edema is controlled  NEUROPATHY: He is still having symptoms especially pain with 100 mg dosage of gabapentin   HYPERTENSION: Well controlled  Hyperlipidemia: Discussed targets for LDL and he does need to a little more aggressive treatment.  Will defer this to PCP and forwarded the labs  PLAN:    Reduce Humalog by 2 units and insulin 30 minutes take this 15 minutes before eating.  May take additional 2 units for large meals or if eating out.  No change in Levemir  Call if having any unusual high or low blood sugars  New dose of gabapentin to be 300 mg  Consistent meals with some protein at each meal and low fat content  Counseling time over 50% of today's 25 minute visit  Patient Instructions  10 units Humalog 10-15 min before meals  Caroljean Monsivais 03/06/2014, 1:49 PM

## 2014-03-07 ENCOUNTER — Encounter: Payer: Self-pay | Admitting: Cardiology

## 2014-03-07 ENCOUNTER — Ambulatory Visit (INDEPENDENT_AMBULATORY_CARE_PROVIDER_SITE_OTHER): Payer: Medicare Other | Admitting: Cardiology

## 2014-03-07 ENCOUNTER — Ambulatory Visit (INDEPENDENT_AMBULATORY_CARE_PROVIDER_SITE_OTHER): Payer: Medicare Other | Admitting: *Deleted

## 2014-03-07 VITALS — BP 132/78 | HR 56 | Ht 71.5 in | Wt 253.0 lb

## 2014-03-07 DIAGNOSIS — I5022 Chronic systolic (congestive) heart failure: Secondary | ICD-10-CM | POA: Insufficient documentation

## 2014-03-07 DIAGNOSIS — I482 Chronic atrial fibrillation, unspecified: Secondary | ICD-10-CM

## 2014-03-07 DIAGNOSIS — I255 Ischemic cardiomyopathy: Secondary | ICD-10-CM

## 2014-03-07 DIAGNOSIS — Z7901 Long term (current) use of anticoagulants: Secondary | ICD-10-CM

## 2014-03-07 DIAGNOSIS — Z86718 Personal history of other venous thrombosis and embolism: Secondary | ICD-10-CM | POA: Insufficient documentation

## 2014-03-07 DIAGNOSIS — D582 Other hemoglobinopathies: Secondary | ICD-10-CM

## 2014-03-07 DIAGNOSIS — I4891 Unspecified atrial fibrillation: Secondary | ICD-10-CM

## 2014-03-07 DIAGNOSIS — Z789 Other specified health status: Secondary | ICD-10-CM

## 2014-03-07 DIAGNOSIS — Z95810 Presence of automatic (implantable) cardiac defibrillator: Secondary | ICD-10-CM

## 2014-03-07 DIAGNOSIS — Z7289 Other problems related to lifestyle: Secondary | ICD-10-CM

## 2014-03-07 DIAGNOSIS — J929 Pleural plaque without asbestos: Secondary | ICD-10-CM

## 2014-03-07 DIAGNOSIS — E785 Hyperlipidemia, unspecified: Secondary | ICD-10-CM

## 2014-03-07 DIAGNOSIS — I25810 Atherosclerosis of coronary artery bypass graft(s) without angina pectoris: Secondary | ICD-10-CM

## 2014-03-07 DIAGNOSIS — I779 Disorder of arteries and arterioles, unspecified: Secondary | ICD-10-CM

## 2014-03-07 DIAGNOSIS — R0602 Shortness of breath: Secondary | ICD-10-CM

## 2014-03-07 DIAGNOSIS — I739 Peripheral vascular disease, unspecified: Secondary | ICD-10-CM

## 2014-03-07 DIAGNOSIS — I10 Essential (primary) hypertension: Secondary | ICD-10-CM

## 2014-03-07 LAB — POCT INR: INR: 2.1

## 2014-03-07 MED ORDER — AMLODIPINE BESYLATE 5 MG PO TABS: 2.5000 mg | ORAL_TABLET | Freq: Every day | ORAL | Status: DC

## 2014-03-07 MED ORDER — LOSARTAN POTASSIUM 100 MG PO TABS: 100.0000 mg | ORAL_TABLET | Freq: Every day | ORAL | Status: DC

## 2014-03-07 NOTE — Assessment & Plan Note (Signed)
He is tolerating 10 mg of atorvastatin. A later date we will try to CV can be titrated higher. I do not want to do that at this time with other medicine changes.

## 2014-03-07 NOTE — Assessment & Plan Note (Signed)
He's been assessed fully by hematology. He does not have a formal hematologic abnormality. However we do think the bringing his hematocrit down may help him feel better. He will begin a program of phlebotomy next week.

## 2014-03-07 NOTE — Assessment & Plan Note (Signed)
He continues on Coumadin for his history of atrial fib and deep venous thrombosis in the past and a history of a left atrial clot.

## 2014-03-07 NOTE — Patient Instructions (Signed)
Your physician has recommended you make the following change in your medication:  1) DECREASE Amlodipine to 2.5 mg daily 2) INCREASE Losartan to 100 mg daily  Your physician recommends that you schedule a follow-up appointment on: January 11 with Dr. [name].

## 2014-03-07 NOTE — Assessment & Plan Note (Signed)
Very recent Holter monitor reveals that his atrial fib rate is controlled. No change in therapy for this. He is anticoagulated.

## 2014-03-07 NOTE — Assessment & Plan Note (Signed)
His EF has varied over the years. Most recently he again has significant left ventricular dysfunction. I'm trying to titrate his meds. I'm hesitant to push a beta blocker as he has had chronotropic incompetence in the past and his current atrial fib rate is controlled. His losartan will be increased from 50-100 mg today. At the same time amlodipine dose will be decreased.

## 2014-03-07 NOTE — Assessment & Plan Note (Signed)
Coronary disease is stable. His grafts were patent in 2008. Over time I may still consider repeat catheterization for his exertional fatigue and shortness of breath.

## 2014-03-07 NOTE — Assessment & Plan Note (Signed)
When I see him next we will consider follow-up carotid Doppler. He had mild disease in the past 2 years ago.

## 2014-03-07 NOTE — Assessment & Plan Note (Signed)
Current etiology of his exertional shortness of breath and fatigue is certainly multifactorial. He does not have asbestosis. He has mild restrictive findings on his PFTs. His obesity plays a role. His elevated hematocrit may play a role, and he is to be phlebotomized. His atrial fibrillation rate appears to be controlled. We do not have any avenues in terms of adjustment of his ICD. His volume status is stable. I am adjusting medications for his cardiomyopathy which certainly may play a role.  As part of today's evaluation of spent greater than 25 minutes with his total care. More than half of the 25 minutes has been spent with direct consultation with the patient and his wife discussing all the findings and all the issues.

## 2014-03-07 NOTE — Assessment & Plan Note (Signed)
During his recent visit we decided that he would drink no alcohol going forward. He says that he is standing by this.

## 2014-03-07 NOTE — Assessment & Plan Note (Signed)
At this point it is felt by pulmonary that he does not have evidence of asbestosis. He needs yearly chest x-rays to follow this.

## 2014-03-07 NOTE — Progress Notes (Signed)
Patient ID: [name], male   DOB: Jun 27, 1940, 73 y.o.   MRN: [id number]    HPI Patient is seen today to follow-up his long list of complex problems. Most recently he's had exertional fatigue and shortness of breath. I've asked for help from several other physicians concerning these problem. I have reviewed notes from all of them. His hemoglobin is elevated. He's been seen in consultation by Dr. Marin Olp. It is felt that he does not have a hematologic abnormality. However it is felt that his hemoglobin is high enough that this might play a role with his overall symptoms. He is to receive phlebotomy soon. He's been very carefully evaluated by Dr. Melvyn Novas of pulmonary. There was question as to whether GERD could be playing a role. Medications did not seem to help the patient in this regard. He does not have evidence of marked COPD. Some of his symptoms may be related to his weight. Of course his cardiac function can be playing a role. He has been seen by Dr. Lovena Le to see if any other adjustments can be made with his ICD. He wore a Holter monitor showing that his heart rate was under good control with his atrial fibrillation. Therefore meds will not be adjusted. In addition it is felt that trying to do anything else with his left ventricular lead will not be helpful because of his right bundle-branch block. He does have ischemic cardiomyopathy. I'm slowly titrating his meds.  Today he is stable and here with his wife. He says that he feels about the same. He is up and around and active but he has exertional shortness of breath. There is no PND or orthopnea.  As part of today's evaluation I have reviewed extensive notes from other physicians about the patient.  Allergies  Allergen Reactions  . Avelox [Moxifloxacin Hcl In Nacl] Swelling, Rash and Other (See Comments)    Patient became hypotensive after infusion started Because of a history of documented adverse serious drug reaction;Medi Alert bracelet   is recommended  . Penicillins Anaphylaxis    REACTION: anaphylaxis Because of a history of documented adverse serious drug reaction;Medi Alert bracelet  is recommended    Current Outpatient Prescriptions  Medication Sig Dispense Refill  . ACCU-CHEK FASTCLIX LANCETS MISC USE ONE LANCET TO CHECK BLOOD SUGAR THREE TIMES DAILY 102 each 5  . ACCU-CHEK SMARTVIEW test strip     . allopurinol (ZYLOPRIM) 100 MG tablet Take 1 tablet (100 mg total) by mouth daily. 90 tablet 3  . amLODipine (NORVASC) 5 MG tablet Take 0.5 tablets (2.5 mg total) by mouth daily. 90 tablet 0  . aspirin 81 MG tablet Take 81 mg by mouth once a week.     Marland Kitchen atorvastatin (LIPITOR) 10 MG tablet Take 1 tablet (10 mg total) by mouth daily. 90 tablet 3  . carvedilol (COREG) 6.25 MG tablet Take 1 tablet (6.25 mg total) by mouth 2 (two) times daily. 180 tablet 1  . chlorthalidone (HYGROTON) 25 MG tablet Take 12.5 mg by mouth daily. Take when weight reaches 243 lbs. or greater only 90 tablet 3  . Cholecalciferol 10000 UNITS CAPS Take 1,000 Units by mouth daily.    . colchicine (COLCRYS) 0.6 MG tablet Take 1 tablet (0.6 mg total) by mouth as needed (for gout). 90 tablet 0  . furosemide (LASIX) 40 MG tablet Take 2 tablets (80 mg total) by mouth 2 (two) times daily. 180 tablet 1  . gabapentin (NEURONTIN) 300 MG capsule 3 x daily  as needed for leg pain 270 capsule 3  . Insulin Detemir (LEVEMIR FLEXPEN) 100 UNIT/ML Pen Inject 35 Units into the skin at bedtime. 15 mL 3  . insulin lispro (HUMALOG KWIKPEN) 100 UNIT/ML KiwkPen Inject 0.12-0.14 mLs (12-14 Units total) into the skin 2 (two) times daily. 5 pen 3  . ipratropium (ATROVENT) 0.06 % nasal spray Place 1 spray into both nostrils as directed.     Marland Kitchen losartan (COZAAR) 100 MG tablet Take 1 tablet (100 mg total) by mouth daily. 90 tablet 0  . metFORMIN (GLUCOPHAGE-XR) 750 MG 24 hr tablet TAKE ONE TABLET BY MOUTH ONCE DAILY WITH BREAKFAST 30 tablet 5  . naphazoline-pheniramine (NAPHCON-A)  0.025-0.3 % ophthalmic solution Place 1 drop into both eyes daily as needed for irritation.    . nitroGLYCERIN (NITROSTAT) 0.4 MG SL tablet Place 0.4 mg under the tongue every 5 (five) minutes as needed for chest pain (MAX 3 TABLETS).     . NOVOLOG FLEXPEN 100 UNIT/ML SOPN FlexPen Inject 12-14 Units into the skin 2 (two) times daily.     . Potassium Chloride ER 20 MEQ TBCR Take 20 mcg by mouth 2 (two) times daily. TAKES 1 1/2 TAB = 30 MEQ.  TWICE A DAY   TOTAL IS 60 MEQ DAILY    . warfarin (COUMADIN) 2.5 MG tablet TAKE AS DIRECTED BY ANTICOAGULATION CLINIC 120 tablet 0   No current facility-administered medications for this visit.    History   Social History  . Marital Status: Married    Spouse Name: N/A    Number of Children: N/A  . Years of Education: N/A   Occupational History  . Retired- Nurse, mental health    Social History Main Topics  . Smoking status: Former Smoker -- 3.00 packs/day for 39 years    Types: Cigarettes    Start date: 05/24/1956    Quit date: 03/22/1995  . Smokeless tobacco: Never Used     Comment: quit smoking 18 years ago  . Alcohol Use: 1.2 oz/week    2 Cans of beer per week     Comment: beer  . Drug Use: No  . Sexual Activity: No   Other Topics Concern  . Not on file   Social History Narrative    Family History  Problem Relation Age of Onset  . Hypertension Mother   . Stroke Mother   . Diabetes Father   . Coronary artery disease Father   . Other Father     DVT    Past Medical History  Diagnosis Date  . Diabetes mellitus   . Hyperlipidemia   . Hypertension   . Pilonidal cyst   . Atrial fibrillation     Previous long-term amiodarone therapy with multiple cardioversions / amiodarone stopped September, 2009  . Atrial flutter     Started November, 2010, Left-sided and cannot ablate  . Left atrial thrombus     Remote past... cardioversions done since that time  . Wide-complex tachycardia   . Left ventricular ejection fraction less  than 40%   . Gout   . AAA (abdominal aortic aneurysm)     Surgical repair  . Discolored skin   . S/P ICD (internal cardiac defibrillator) procedure     Dr. Lovena Le 2009... by the pacing  . SOB (shortness of breath)     Large left effusion/ thoracentesis/hospitalization/November, 2011... exudated.. cytology negative.. Dr.Wert.. no proof of mesothelioma  . Pericardial effusion   . Pleural effusion     Large loculated effusion on  the left side November, 2011. This was tapped. It was exudative. Cytology revealed no cancer no proof of mesothelioma area pulmonary team felt that no further workup was needed  . S/P AAA repair   . Spinal stenosis     Surgery Dr.Elsner  . CAD (coronary artery disease)     Catheterization July, 2008... name and vein grafts patent but low cardiac output  . Warfarin anticoagulation   . Cardiomyopathy     Ischemic... ICD  . Combined systolic and diastolic CHF   . Venous insufficiency     Toe discoloration chronic  . Mitral regurgitation     Mild echo  . Aortic valve sclerosis   . Nasal drainage     Chronic  . Alcohol ingestion of more than four drinks per week     Excess beer  not a dependency problem  . Chronotropic incompetence     IV pacing rate adjusted  . Thrombophlebitis of superficial veins of upper extremities     Possible venous stenosis from defibrillator  . Pericardial effusion     November, 2011 .. decreased during hospitalization  . Eye abnormality     Ophthalmologist questions a clot in one of his eyes, May, 2012  . Overweight(278.02)     November, 2012  . Pleural thickening   . Ejection fraction     Ejection fraction has varied over time from 35-50%.,, Echoes are technically very difficult,,, EF 50%, echo, May 25, 2011, technically very difficult  . Drug therapy     Redness and swelling with Avelox infusion May 24, 2011  . COPD (chronic obstructive pulmonary disease)   . Myocardial infarction   . Spinal cord stimulator status      October, 2013  . Carotid artery disease     Doppler, December, 2013, 0-39% bilateral  . Pneumonia   . Arthritis   . Ventral hernia     April, 2014, result of his abdominal surgery  . Bony abnormality     Patient's manubrium is slightly displaced to the right    Past Surgical History  Procedure Laterality Date  . Colonoscopy w/ polypectomy    . Lumbar fusion    . Pilonidal cyst removal    . Surgery scrotal / testicular    . Coronary artery bypass graft  2004  . Abdominal aortic aneurysm repair  11/2002  . Bi-ventricular pacemaker insertion (crt-p)  02-11-2013    Pt with previously implanted MDT CRTD downgraded to CRTP by Dr Lovena Le 02-11-13  . Back surgery    . Incision and drainage abscess / hematoma of bursa / knee / thigh    . Implantable cardioverter defibrillator (icd) generator change N/A 02/11/2013    Procedure: ICD GENERATOR CHANGE;  Surgeon: Evans Lance, MD;  Location: [facility] CATH LAB;  Service: Cardiovascular;  Laterality: N/A;    Patient Active Problem List   Diagnosis Date Noted  . Chronic systolic CHF (congestive heart failure) 03/07/2014  . Cardiomyopathy, ischemic 02/05/2014  . Pleural plaque without asbestosis 01/23/2014  . Elevated hemoglobin 01/14/2014  . CKD (chronic kidney disease) stage 3, GFR 30-59 ml/min 01/14/2014  . Asbestos exposure 01/14/2014  . Shortness of breath 01/01/2014  . Abscess, scrotum 03/05/2013  . Combined systolic and diastolic CHF   . Ejection fraction   . Hypopotassemia 12/20/2012  . ACE-inhibitor cough 10/04/2012  . Ventral hernia   . Bony abnormality   . Cellulitis of scrotum 05/23/2012  . Carotid artery disease   . Spinal cord stimulator status   .  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 Atrial fibrillation   . Spinal stenosis   . CAD (coronary artery disease)   . Venous insufficiency   . Mitral regurgitation   . Aortic valve sclerosis   . Alcohol ingestion of more than four drinks per week   .  Chronotropic incompetence   . Thrombophlebitis of superficial veins of upper extremities   . Pericardial effusion   . Overweight   . Pleural thickening   . Pleural effusion   . Drug therapy   . Hyperlipidemia   . Hypertension   . Atrial flutter   . Left atrial thrombus   . AAA (abdominal aortic aneurysm)   . S/P ICD (internal cardiac defibrillator) procedure   . Warfarin anticoagulation   . Polyneuropathy in diabetes(357.2) 01/07/2010  . DIVERTICULOSIS, COLON 06/10/2009  . Uncontrolled diabetes mellitus with peripheral circulatory disorder 03/11/2009  . CKD (chronic kidney disease), stage III 06/19/2008  . BACK PAIN, LUMBAR 06/12/2008  . 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 GOUT 07/27/2006  . Acute thromboembolism of deep veins of lower extremity 07/27/2006  . CORONARY ARTERY BYPASS GRAFT, HX OF 07/27/2006    ROS  Patient denies fever, chills, headache, sweats, rash, change in vision, change in hearing, chest pain, cough, nausea or vomiting, urinary symptoms. All other systems are reviewed and are negative.  PHYSICAL EXAM Patient is oriented to person time and place. Affect is normal. He is here with his wife. Head is atraumatic. Sclera and conjunctiva are normal. There is no jugular venous distention. Lungs are clear. Respiratory effort is not labored. Cardiac exam reveals S1 and S2. Abdomen is protuberant as always. There is no significant peripheral edema. There are no musculoskeletal deformities. There are no skin rashes. Neurologic is grossly intact.  Filed Vitals:   03/07/14 0955  BP: 132/78  Pulse: 56  Height: 5' 11.5" (1.816 m)  Weight: 253 lb (114.76 kg)  SpO2: 97%     ASSESSMENT & PLAN

## 2014-03-07 NOTE — Assessment & Plan Note (Signed)
Dr. Lovena Le has reassessed his ICD carefully. At this point no further adjustments are to be made. We will not be planning to try to turn his left ventricular lead back on.

## 2014-03-07 NOTE — Assessment & Plan Note (Signed)
Over time we have varied his blood pressure medicines based on his pressure. Most recently as I have been titrating losartan up I am lowering his amlodipine dose. His blood pressure is responding well.

## 2014-03-07 NOTE — Assessment & Plan Note (Signed)
His overall volume status is stable at this time. He watches his salt intake. I have just learned that he drinks in the range of 5 soft drinks daily. He insists that they're caffeine free. I reminded him that the still include fluid. He brings in his weight record. He takes an extra diuretic anytime his weight is above 243 pounds.

## 2014-03-10 ENCOUNTER — Telehealth: Payer: Self-pay | Admitting: Cardiology

## 2014-03-10 ENCOUNTER — Other Ambulatory Visit: Payer: Self-pay | Admitting: Cardiology

## 2014-03-10 ENCOUNTER — Telehealth: Payer: Self-pay

## 2014-03-10 ENCOUNTER — Ambulatory Visit (HOSPITAL_BASED_OUTPATIENT_CLINIC_OR_DEPARTMENT_OTHER): Payer: Medicare Other

## 2014-03-10 VITALS — BP 132/75 | HR 62 | Temp 97.6°F | Resp 16

## 2014-03-10 DIAGNOSIS — E1142 Type 2 diabetes mellitus with diabetic polyneuropathy: Secondary | ICD-10-CM

## 2014-03-10 DIAGNOSIS — D751 Secondary polycythemia: Secondary | ICD-10-CM

## 2014-03-10 NOTE — Telephone Encounter (Signed)
02/18/14  Weight= 241 lbs  The pts wife, Able, states that the pt continues to have right leg edema and increased sob.  02/19/14  Weight= 241 lbs  Kolyn reports no changes in the pts right leg edema or his SOB at this time. 02/20/14  Weight= 243 lbs  Dailey reports no changes in the pts right leg edema or his SOB at this time.  02/21/14  Weight= 244 lbs  Sharron reports no changes in the pts right leg edema or his SOB at this time. The pt took a Chlorthalidone dose today due to weight of 244. 02/22/14  Weight= 243 lbs  Masoud reports no changes in the pts right leg edema or his SOB at this time. 02/23/14  Weight= 243 lbs  Quanta reports no changes in the pts right leg edema or his SOB at this time. The pt took a Chlorthalidone dose today due to weight of 244. 02/24/14  Weight= 246 lbs  Dearies reports no changes in the pts right leg edema or his SOB at this time. 02/25/14  Weight= 242 lbs  Naomi reports no changes in the pts right leg edema or his SOB at this time. 02/26/14  Weight= 242 lbs  Marcelo reports no changes in the pts right leg edema or his SOB at this time.  02/27/14  Weight= 244 lbs  Symir reports no changes in the pts right leg edema or his SOB at this time. The pt took a Chlorthalidone dose today due to weight of 244. 03/07/14  The pt had an OV with [name] today and was advised to decrease Amlodipine to 2.5 mg daily, increase Losartan to 100 mg daily and to f/u with [name] on 03/31/14. 03/08/14  Weight= 246 lbs   Nathaneil reports no changes in the pts right leg edema or his SOB at this time. The pt took a Chlorthalidone dose today due to weight greater than 244. 03/09/14  Weight= 243 lbs  Toddy reports no changes in the pts right leg edema or his SOB at this time. The pt took a Chlorthalidone dose today due to weight greater than 244. 03/10/14  Weight= 242 lbs   Manvir reports no changes in the pts right leg edema or his SOB at this time.

## 2014-03-10 NOTE — Patient Instructions (Signed)

## 2014-03-10 NOTE — Telephone Encounter (Signed)
New Message       Patient wife is returning call for patient, Please return call. Thanks

## 2014-03-10 NOTE — Progress Notes (Signed)
[name] presents today for phlebotomy per MD orders. Phlebotomy procedure started at 1420 and ended at 1445. 461mL removed. Patient observed for 30 minutes after procedure without any incident. Patient tolerated procedure well. IV needle removed intact.

## 2014-03-10 NOTE — Telephone Encounter (Signed)
**Note De-Identified Maddix Heinz Obfuscation** Loyd, the pts wife called to give the pt weight for today. Noted.

## 2014-03-17 NOTE — Telephone Encounter (Signed)
**Note De-Identified Selene Peltzer Obfuscation** 03/11/14  Weight= 244 lbs The pts wife, Joaquim Lai, reports no change in the pts edema or SOB status at this time. 03/12/14  Weight= 242 lbs  The pts wife, Joaquim Lai, reports no change in the pts edema or SOB status at this time. 03/13/14  Weight= 241 lbs  The pts wife, Joaquim Lai, reports no change in the pts edema or SOB status at this time. 03/14/14  Weight= 246 lbs  The pts wife, Joaquim Lai, reports no change in the pts edema or SOB status at this time. The pt took a Chlorthalidone dose due to weight greater than 243 lbs. 03/15/14  Weight= 245 lbs. The pts wife, Joaquim Lai, reports no change in the pts edema or SOB status at this time. The pt took a Chlorthalidone dose due to weight greater than 243 lbs. 03/16/14  Weight= 240 lbs  The pts wife, Joaquim Lai, reports no change in the pts edema or SOB status at this time. 03/17/14  Weight= 241 lbs  The pts wife, Joaquim Lai, reports no change in the pts edema or SOB status at this time.

## 2014-03-25 ENCOUNTER — Encounter: Payer: Medicare Other | Admitting: Internal Medicine

## 2014-03-31 ENCOUNTER — Encounter: Payer: Self-pay | Admitting: Cardiology

## 2014-03-31 ENCOUNTER — Ambulatory Visit (INDEPENDENT_AMBULATORY_CARE_PROVIDER_SITE_OTHER): Payer: Medicare Other

## 2014-03-31 ENCOUNTER — Ambulatory Visit (INDEPENDENT_AMBULATORY_CARE_PROVIDER_SITE_OTHER): Payer: Medicare Other | Admitting: Cardiology

## 2014-03-31 VITALS — BP 110/62 | HR 66 | Ht 72.0 in | Wt 252.0 lb

## 2014-03-31 DIAGNOSIS — E119 Type 2 diabetes mellitus without complications: Secondary | ICD-10-CM

## 2014-03-31 DIAGNOSIS — I779 Disorder of arteries and arterioles, unspecified: Secondary | ICD-10-CM | POA: Diagnosis not present

## 2014-03-31 DIAGNOSIS — I482 Chronic atrial fibrillation, unspecified: Secondary | ICD-10-CM

## 2014-03-31 DIAGNOSIS — I4891 Unspecified atrial fibrillation: Secondary | ICD-10-CM | POA: Diagnosis not present

## 2014-03-31 DIAGNOSIS — N183 Chronic kidney disease, stage 3 unspecified: Secondary | ICD-10-CM

## 2014-03-31 DIAGNOSIS — D582 Other hemoglobinopathies: Secondary | ICD-10-CM

## 2014-03-31 DIAGNOSIS — E785 Hyperlipidemia, unspecified: Secondary | ICD-10-CM

## 2014-03-31 DIAGNOSIS — R0602 Shortness of breath: Secondary | ICD-10-CM

## 2014-03-31 DIAGNOSIS — I739 Peripheral vascular disease, unspecified: Secondary | ICD-10-CM

## 2014-03-31 DIAGNOSIS — I5022 Chronic systolic (congestive) heart failure: Secondary | ICD-10-CM

## 2014-03-31 DIAGNOSIS — I10 Essential (primary) hypertension: Secondary | ICD-10-CM

## 2014-03-31 DIAGNOSIS — I255 Ischemic cardiomyopathy: Secondary | ICD-10-CM

## 2014-03-31 DIAGNOSIS — Z7901 Long term (current) use of anticoagulants: Secondary | ICD-10-CM | POA: Diagnosis not present

## 2014-03-31 LAB — POCT INR: INR: 1.9

## 2014-03-31 NOTE — Assessment & Plan Note (Signed)
His volume is controlled. He continues to watch his weight daily. He is on baseline diuretics. He takes additional chlorthalidone any day that his weight is above the range of 243 pounds on his scale at home.

## 2014-03-31 NOTE — Assessment & Plan Note (Signed)
We have gotten him back on 10 mg of atorvastatin. I may titrate this dose up over time also. However no change in medicines today.

## 2014-03-31 NOTE — Assessment & Plan Note (Signed)
He has had phlebotomy on one occasion. He has been feeling somewhat better since that time. We cannot be sure this is related to phlebotomy. However I am certainly in favor of the course that we are taking with phlebotomy.

## 2014-03-31 NOTE — Assessment & Plan Note (Signed)
Over the next few months we'll consider a follow-up carotid Doppler.

## 2014-03-31 NOTE — Assessment & Plan Note (Signed)
Recent Holter revealed that his atrial fib rate was controlled. Over time I may consider trying to push his carvedilol dose up for his cardiomyopathy. However I'm hesitant to make any changes today with him feeling somewhat better.

## 2014-03-31 NOTE — Assessment & Plan Note (Signed)
He has had a full pulmonary evaluation. No further pulmonary workup at this time. We are controlling his volume. Phlebotomy is being done for his high hemoglobin. He is feeling a little bit better. No further changes as of today.

## 2014-03-31 NOTE — Assessment & Plan Note (Signed)
He is tolerating the increase in the losartan dose. No further changes today. He is not a candidate for the addition of spironolactone. I may titrate his carvedilol up over time, but no change today.

## 2014-03-31 NOTE — Progress Notes (Signed)
Patient ID: [name], male   DOB: 24-Jun-1940, 74 y.o.   MRN: [id number]    HPI Patient is seen today to follow-up coronary disease, atrial fib, ischemic cardiomyopathy, elevated hemoglobin, shortness of breath. I saw him last March 07, 2014. There is an extensive note. He had a phlebotomy on March 10, 2014. We continue to follow his weights carefully. His weight is actually up slightly and he has been using Chlorthalidone on the days that his weight is above 243 pounds. I had increased his losartan from 50 mg to 100 mg one I saw him last. With these factors in mind, he says that he thinks that his breathing is a little bit better. He still has significant exertional shortness of breath, but he is feeling a little better. It is possible that phlebotomy is helping him.  Allergies  Allergen Reactions  . Avelox [Moxifloxacin Hcl In Nacl] Swelling, Rash and Other (See Comments)    Patient became hypotensive after infusion started Because of a history of documented adverse serious drug reaction;Medi Alert bracelet  is recommended  . Penicillins Anaphylaxis    REACTION: anaphylaxis Because of a history of documented adverse serious drug reaction;Medi Alert bracelet  is recommended    Current Outpatient Prescriptions  Medication Sig Dispense Refill  . ACCU-CHEK FASTCLIX LANCETS MISC USE ONE LANCET TO CHECK BLOOD SUGAR THREE TIMES DAILY 102 each 5  . ACCU-CHEK SMARTVIEW test strip     . allopurinol (ZYLOPRIM) 100 MG tablet Take 1 tablet (100 mg total) by mouth daily. 90 tablet 3  . amLODipine (NORVASC) 5 MG tablet Take 0.5 tablets (2.5 mg total) by mouth daily. 90 tablet 0  . aspirin 81 MG tablet Take 81 mg by mouth once a week.     Marland Kitchen atorvastatin (LIPITOR) 10 MG tablet Take 1 tablet (10 mg total) by mouth daily. 90 tablet 3  . carvedilol (COREG) 6.25 MG tablet Take 1 tablet (6.25 mg total) by mouth 2 (two) times daily. 180 tablet 1  . chlorthalidone (HYGROTON) 25 MG tablet Take 12.5 mg  by mouth daily. Take when weight reaches 243 lbs. or greater only 90 tablet 3  . Cholecalciferol 10000 UNITS CAPS Take 1,000 Units by mouth daily.    . colchicine (COLCRYS) 0.6 MG tablet Take 1 tablet (0.6 mg total) by mouth as needed (for gout). 90 tablet 0  . furosemide (LASIX) 40 MG tablet Take 2 tablets (80 mg total) by mouth 2 (two) times daily. 180 tablet 1  . gabapentin (NEURONTIN) 300 MG capsule 3 x daily as needed for leg pain 270 capsule 3  . Insulin Detemir (LEVEMIR FLEXPEN) 100 UNIT/ML Pen Inject 35 Units into the skin at bedtime. 15 mL 3  . insulin lispro (HUMALOG KWIKPEN) 100 UNIT/ML KiwkPen Inject 0.12-0.14 mLs (12-14 Units total) into the skin 2 (two) times daily. 5 pen 3  . ipratropium (ATROVENT) 0.06 % nasal spray Place 1 spray into both nostrils as directed.     Marland Kitchen losartan (COZAAR) 100 MG tablet Take 1 tablet (100 mg total) by mouth daily. 90 tablet 0  . metFORMIN (GLUCOPHAGE-XR) 750 MG 24 hr tablet TAKE ONE TABLET BY MOUTH ONCE DAILY WITH BREAKFAST 30 tablet 5  . naphazoline-pheniramine (NAPHCON-A) 0.025-0.3 % ophthalmic solution Place 1 drop into both eyes daily as needed for irritation.    . nitroGLYCERIN (NITROSTAT) 0.4 MG SL tablet Place 0.4 mg under the tongue every 5 (five) minutes as needed for chest pain (MAX 3 TABLETS).     Marland Kitchen  NOVOLOG FLEXPEN 100 UNIT/ML SOPN FlexPen Inject 12-14 Units into the skin 2 (two) times daily.     . Potassium Chloride ER 20 MEQ TBCR Take 20 mcg by mouth 2 (two) times daily. TAKES 1 1/2 TAB = 30 MEQ.  TWICE A DAY   TOTAL IS 60 MEQ DAILY    . warfarin (COUMADIN) 2.5 MG tablet TAKE AS DIRECTED BY  ANTICOAGULATION  CLINIC 130 tablet 0   No current facility-administered medications for this visit.    History   Social History  . Marital Status: Married    Spouse Name: N/A    Number of Children: N/A  . Years of Education: N/A   Occupational History  . Retired- Nurse, mental health    Social History Main Topics  . Smoking status:  Former Smoker -- 3.00 packs/day for 39 years    Types: Cigarettes    Start date: 05/24/1956    Quit date: 03/22/1995  . Smokeless tobacco: Never Used     Comment: quit smoking 18 years ago  . Alcohol Use: 1.2 oz/week    2 Cans of beer per week     Comment: beer  . Drug Use: No  . Sexual Activity: No   Other Topics Concern  . Not on file   Social History Narrative    Family History  Problem Relation Age of Onset  . Hypertension Mother   . Stroke Mother   . Diabetes Father   . Coronary artery disease Father   . Other Father     DVT    Past Medical History  Diagnosis Date  . Diabetes mellitus   . Hyperlipidemia   . Hypertension   . Pilonidal cyst   . Atrial fibrillation     Previous long-term amiodarone therapy with multiple cardioversions / amiodarone stopped September, 2009  . Atrial flutter     Started November, 2010, Left-sided and cannot ablate  . Left atrial thrombus     Remote past... cardioversions done since that time  . Wide-complex tachycardia   . Left ventricular ejection fraction less than 40%   . Gout   . AAA (abdominal aortic aneurysm)     Surgical repair  . Discolored skin   . S/P ICD (internal cardiac defibrillator) procedure     Dr. Lovena Le 2009... by the pacing  . SOB (shortness of breath)     Large left effusion/ thoracentesis/hospitalization/November, 2011... exudated.. cytology negative.. Dr.Wert.. no proof of mesothelioma  . Pericardial effusion   . Pleural effusion     Large loculated effusion on the left side November, 2011. This was tapped. It was exudative. Cytology revealed no cancer no proof of mesothelioma area pulmonary team felt that no further workup was needed  . S/P AAA repair   . Spinal stenosis     Surgery Dr.Elsner  . CAD (coronary artery disease)     Catheterization July, 2008... name and vein grafts patent but low cardiac output  . Warfarin anticoagulation   . Cardiomyopathy     Ischemic... ICD  . Combined systolic and  diastolic CHF   . Venous insufficiency     Toe discoloration chronic  . Mitral regurgitation     Mild echo  . Aortic valve sclerosis   . Nasal drainage     Chronic  . Alcohol ingestion of more than four drinks per week     Excess beer  not a dependency problem  . Chronotropic incompetence     IV pacing rate adjusted  . Thrombophlebitis  of superficial veins of upper extremities     Possible venous stenosis from defibrillator  . Pericardial effusion     November, 2011 .. decreased during hospitalization  . Eye abnormality     Ophthalmologist questions a clot in one of his eyes, May, 2012  . Overweight(278.02)     November, 2012  . Pleural thickening   . Ejection fraction     Ejection fraction has varied over time from 35-50%.,, Echoes are technically very difficult,,, EF 50%, echo, May 25, 2011, technically very difficult  . Drug therapy     Redness and swelling with Avelox infusion May 24, 2011  . COPD (chronic obstructive pulmonary disease)   . Myocardial infarction   . Spinal cord stimulator status     October, 2013  . Carotid artery disease     Doppler, December, 2013, 0-39% bilateral  . Pneumonia   . Arthritis   . Ventral hernia     April, 2014, result of his abdominal surgery  . Bony abnormality     Patient's manubrium is slightly displaced to the right    Past Surgical History  Procedure Laterality Date  . Colonoscopy w/ polypectomy    . Lumbar fusion    . Pilonidal cyst removal    . Surgery scrotal / testicular    . Coronary artery bypass graft  2004  . Abdominal aortic aneurysm repair  11/2002  . Bi-ventricular pacemaker insertion (crt-p)  02-11-2013    Pt with previously implanted MDT CRTD downgraded to CRTP by Dr Lovena Le 02-11-13  . Back surgery    . Incision and drainage abscess / hematoma of bursa / knee / thigh    . Implantable cardioverter defibrillator (icd) generator change N/A 02/11/2013    Procedure: ICD GENERATOR CHANGE;  Surgeon: Evans Lance,  MD;  Location: [facility] CATH LAB;  Service: Cardiovascular;  Laterality: N/A;    Patient Active Problem List   Diagnosis Date Noted  . Chronic systolic CHF (congestive heart failure) 03/07/2014  . History of DVT (deep vein thrombosis) 03/07/2014  . Cardiomyopathy, ischemic 02/05/2014  . Pleural plaque without asbestosis 01/23/2014  . Elevated hemoglobin 01/14/2014  . CKD (chronic kidney disease) stage 3, GFR 30-59 ml/min 01/14/2014  . Asbestos exposure 01/14/2014  . Shortness of breath 01/01/2014  . Abscess, scrotum 03/05/2013  . Ejection fraction   . Hypopotassemia 12/20/2012  . ACE-inhibitor cough 10/04/2012  . Ventral hernia   . Bony abnormality   . Cellulitis of scrotum 05/23/2012  . Carotid artery disease   . Spinal cord stimulator status   . 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 Atrial fibrillation   . Spinal stenosis   . CAD (coronary artery disease)   . Venous insufficiency   . Mitral regurgitation   . Aortic valve sclerosis   . Alcohol ingestion of more than four drinks per week   . Chronotropic incompetence   . Thrombophlebitis of superficial veins of upper extremities   . Pericardial effusion   . Overweight   . Pleural effusion   . Drug therapy   . Hyperlipidemia   . Hypertension   . Atrial flutter   . Left atrial thrombus   . AAA (abdominal aortic aneurysm)   . S/P ICD (internal cardiac defibrillator) procedure   . Warfarin anticoagulation   . Diabetic polyneuropathy 01/07/2010  . DIVERTICULOSIS, COLON 06/10/2009  . Uncontrolled diabetes mellitus with peripheral circulatory disorder 03/11/2009  . CKD (chronic kidney disease), stage III 06/19/2008  . BACK PAIN,  LUMBAR 06/12/2008  . 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 GOUT 07/27/2006  . CORONARY ARTERY BYPASS GRAFT, HX OF 07/27/2006    ROS  Patient denies fever, chills, headache, sweats, rash, change in vision, change in hearing, chest pain, cough, nausea or  vomiting, urinary symptoms. All other systems are reviewed and are negative.  PHYSICAL EXAM Patient is oriented to person time and place. Affect is normal. He is here with his wife. Head is atraumatic. Sclera and conjunctiva are normal. There is no jugular venous distention. Lungs are clear. Respiratory effort is nonlabored. Cardiac exam reveals S1 and S2. The rhythm is irregularly irregular. The rate is controlled. Abdomen is protuberant but soft. There is trace peripheral edema. He is walking with a cane. There are no skin rashes.  Filed Vitals:   03/31/14 0916  BP: 110/62  Pulse: 66  Height: 6' (1.829 m)  Weight: 252 lb (114.306 kg)     ASSESSMENT & PLAN

## 2014-03-31 NOTE — Patient Instructions (Signed)
**Note De-Identified Othella Slappey Obfuscation** Your physician recommends that you continue on your current medications as directed. Please refer to the Current Medication list given to you today.  Your physician recommends that you schedule a follow-up appointment in: 8 weeks

## 2014-03-31 NOTE — Assessment & Plan Note (Signed)
He will have follow-up lab soon when he is seen in the cancer center for the consideration of more phlebotomy.

## 2014-03-31 NOTE — Assessment & Plan Note (Signed)
Blood pressure recordings from home with the increased dose of losartan and decreased dose of amlodipine look good. No change today.

## 2014-04-02 ENCOUNTER — Encounter: Payer: Self-pay | Admitting: Hematology & Oncology

## 2014-04-02 ENCOUNTER — Ambulatory Visit (HOSPITAL_BASED_OUTPATIENT_CLINIC_OR_DEPARTMENT_OTHER): Payer: Medicare Other | Admitting: Hematology & Oncology

## 2014-04-02 ENCOUNTER — Other Ambulatory Visit (HOSPITAL_BASED_OUTPATIENT_CLINIC_OR_DEPARTMENT_OTHER): Payer: Medicare Other | Admitting: Lab

## 2014-04-02 ENCOUNTER — Ambulatory Visit (HOSPITAL_BASED_OUTPATIENT_CLINIC_OR_DEPARTMENT_OTHER): Payer: Medicare Other

## 2014-04-02 VITALS — BP 97/54 | HR 64 | Temp 97.4°F | Resp 20 | Ht 72.0 in | Wt 250.0 lb

## 2014-04-02 VITALS — BP 121/72 | HR 70 | Temp 98.0°F | Resp 16

## 2014-04-02 DIAGNOSIS — R531 Weakness: Secondary | ICD-10-CM | POA: Diagnosis not present

## 2014-04-02 DIAGNOSIS — D45 Polycythemia vera: Secondary | ICD-10-CM

## 2014-04-02 DIAGNOSIS — D751 Secondary polycythemia: Secondary | ICD-10-CM

## 2014-04-02 DIAGNOSIS — E1142 Type 2 diabetes mellitus with diabetic polyneuropathy: Secondary | ICD-10-CM | POA: Diagnosis not present

## 2014-04-02 LAB — CMP (CANCER CENTER ONLY)
ALBUMIN: 3.6 g/dL (ref 3.3–5.5)
ALT(SGPT): 13 U/L (ref 10–47)
AST: 14 U/L (ref 11–38)
Alkaline Phosphatase: 57 U/L (ref 26–84)
BUN, Bld: 46 mg/dL — ABNORMAL HIGH (ref 7–22)
CALCIUM: 9.5 mg/dL (ref 8.0–10.3)
CHLORIDE: 93 meq/L — AB (ref 98–108)
CO2: 30 meq/L (ref 18–33)
Creat: 2 mg/dl — ABNORMAL HIGH (ref 0.6–1.2)
GLUCOSE: 184 mg/dL — AB (ref 73–118)
Potassium: 3.5 mEq/L (ref 3.3–4.7)
SODIUM: 138 meq/L (ref 128–145)
TOTAL PROTEIN: 6.8 g/dL (ref 6.4–8.1)
Total Bilirubin: 1.2 mg/dl (ref 0.20–1.60)

## 2014-04-02 LAB — CBC WITH DIFFERENTIAL (CANCER CENTER ONLY)
BASO#: 0 10*3/uL (ref 0.0–0.2)
BASO%: 0.2 % (ref 0.0–2.0)
EOS ABS: 0.1 10*3/uL (ref 0.0–0.5)
EOS%: 0.9 % (ref 0.0–7.0)
HEMATOCRIT: 49.6 % (ref 38.7–49.9)
HGB: 17 g/dL (ref 13.0–17.1)
LYMPH#: 1.2 10*3/uL (ref 0.9–3.3)
LYMPH%: 12.7 % — ABNORMAL LOW (ref 14.0–48.0)
MCH: 31.3 pg (ref 28.0–33.4)
MCHC: 34.3 g/dL (ref 32.0–35.9)
MCV: 91 fL (ref 82–98)
MONO#: 0.8 10*3/uL (ref 0.1–0.9)
MONO%: 8.6 % (ref 0.0–13.0)
NEUT#: 7.3 10*3/uL — ABNORMAL HIGH (ref 1.5–6.5)
NEUT%: 77.6 % (ref 40.0–80.0)
PLATELETS: 165 10*3/uL (ref 145–400)
RBC: 5.44 10*6/uL (ref 4.20–5.70)
RDW: 13.9 % (ref 11.1–15.7)
WBC: 9.4 10*3/uL (ref 4.0–10.0)

## 2014-04-02 LAB — FERRITIN CHCC: FERRITIN: 196 ng/mL (ref 22–316)

## 2014-04-02 LAB — RETICULOCYTES (CHCC)
ABS RETIC: 75.2 10*3/uL (ref 19.0–186.0)
RBC.: 5.37 MIL/uL (ref 4.22–5.81)
RETIC CT PCT: 1.4 % (ref 0.4–2.3)

## 2014-04-02 LAB — IRON AND TIBC CHCC
%SAT: 38 % (ref 20–55)
IRON: 134 ug/dL (ref 42–163)
TIBC: 356 ug/dL (ref 202–409)
UIBC: 222 ug/dL (ref 117–376)

## 2014-04-02 MED ORDER — SODIUM CHLORIDE 0.9 % IV SOLN
Freq: Once | INTRAVENOUS | Status: AC
  Administered 2014-04-02: 14:00:00 via INTRAVENOUS

## 2014-04-02 NOTE — Patient Instructions (Signed)

## 2014-04-02 NOTE — Progress Notes (Signed)
1:52 PM Pt complain of weakness, BP down 83/42. Chair laid flat, will start NS bolus.

## 2014-04-02 NOTE — Progress Notes (Signed)
[name] presents today for phlebotomy per MD orders. Phlebotomy procedure started at 1310 and ended at 1318. 575mL grams removed. Patient observed for 30 minutes after procedure without any incident. Patient tolerated procedure well. IV needle removed intact.

## 2014-04-02 NOTE — Progress Notes (Signed)
Hematology and Oncology Follow Up Visit  [name] [id number] 27-Feb-1941 74 y.o. 04/02/2014   Principle Diagnosis:   Polycythemia vera  Current Therapy:    Phlebotomy to maintain hematocrit below 45%.  Aspirin 81 mg by mouth daily     Interim History:  [name] is back for follow-up. This is a second office visit. He was first seen I think back in November. His working well that time was equivocal for polycythemia. He had a negative JAK2 assay. His iron studies showed a ferritin 155. Iron saturation was 23. Vitamin B-12 was 311. What really stuck out was that his erythropoietin level was only 10.  He just appears, to me, that he has too much blood. As such, we gave him a phlebotomy. This was in December. He felt better afterwards.  He feels okay right now. He's felt a little more sluggish. He's had no problems with nausea vomiting. He is on Coumadin. He gets his cardiologist to check his INR.  He's had no pruritus. He's had occasional headache. There's been no visual change. He's had no leg swelling. He's had no rashes.  Medications:  Current outpatient prescriptions:  .  ACCU-CHEK FASTCLIX LANCETS MISC, USE ONE LANCET TO CHECK BLOOD SUGAR THREE TIMES DAILY, Disp: 102 each, Rfl: 5 .  ACCU-CHEK SMARTVIEW test strip, , Disp: , Rfl:  .  allopurinol (ZYLOPRIM) 100 MG tablet, Take 1 tablet (100 mg total) by mouth daily., Disp: 90 tablet, Rfl: 3 .  amLODipine (NORVASC) 5 MG tablet, Take 0.5 tablets (2.5 mg total) by mouth daily., Disp: 90 tablet, Rfl: 0 .  aspirin 81 MG tablet, Take 81 mg by mouth once a week. , Disp: , Rfl:  .  atorvastatin (LIPITOR) 10 MG tablet, Take 1 tablet (10 mg total) by mouth daily., Disp: 90 tablet, Rfl: 3 .  carvedilol (COREG) 6.25 MG tablet, Take 1 tablet (6.25 mg total) by mouth 2 (two) times daily., Disp: 180 tablet, Rfl: 1 .  chlorthalidone (HYGROTON) 25 MG tablet, Take 12.5 mg by mouth daily. Take when weight reaches 243 lbs. or greater only,  Disp: 90 tablet, Rfl: 3 .  Cholecalciferol 10000 UNITS CAPS, Take 1,000 Units by mouth daily., Disp: , Rfl:  .  colchicine (COLCRYS) 0.6 MG tablet, Take 1 tablet (0.6 mg total) by mouth as needed (for gout)., Disp: 90 tablet, Rfl: 0 .  furosemide (LASIX) 40 MG tablet, Take 2 tablets (80 mg total) by mouth 2 (two) times daily., Disp: 180 tablet, Rfl: 1 .  gabapentin (NEURONTIN) 300 MG capsule, 3 x daily as needed for leg pain, Disp: 270 capsule, Rfl: 3 .  Insulin Detemir (LEVEMIR FLEXPEN) 100 UNIT/ML Pen, Inject 35 Units into the skin at bedtime., Disp: 15 mL, Rfl: 3 .  ipratropium (ATROVENT) 0.06 % nasal spray, Place 1 spray into both nostrils as directed. , Disp: , Rfl:  .  losartan (COZAAR) 100 MG tablet, Take 1 tablet (100 mg total) by mouth daily., Disp: 90 tablet, Rfl: 0 .  metFORMIN (GLUCOPHAGE-XR) 750 MG 24 hr tablet, TAKE ONE TABLET BY MOUTH ONCE DAILY WITH BREAKFAST, Disp: 30 tablet, Rfl: 5 .  naphazoline-pheniramine (NAPHCON-A) 0.025-0.3 % ophthalmic solution, Place 1 drop into both eyes daily as needed for irritation., Disp: , Rfl:  .  nitroGLYCERIN (NITROSTAT) 0.4 MG SL tablet, Place 0.4 mg under the tongue every 5 (five) minutes as needed for chest pain (MAX 3 TABLETS). , Disp: , Rfl:  .  NOVOLOG FLEXPEN 100 UNIT/ML SOPN FlexPen,  Inject 12-14 Units into the skin 2 (two) times daily. , Disp: , Rfl:  .  Potassium Chloride ER 20 MEQ TBCR, Take 20 mcg by mouth 2 (two) times daily. TAKES 1 1/2 TAB = 30 MEQ.  TWICE A DAY   TOTAL IS 60 MEQ DAILY, Disp: , Rfl:  .  warfarin (COUMADIN) 2.5 MG tablet, TAKE AS DIRECTED BY  ANTICOAGULATION  CLINIC, Disp: 130 tablet, Rfl: 0 .  insulin lispro (HUMALOG KWIKPEN) 100 UNIT/ML KiwkPen, Inject 0.12-0.14 mLs (12-14 Units total) into the skin 2 (two) times daily. (Patient not taking: Reported on 04/02/2014), Disp: 5 pen, Rfl: 3  Allergies:  Allergies  Allergen Reactions  . Avelox [Moxifloxacin Hcl In Nacl] Swelling, Rash and Other (See Comments)    Patient  became hypotensive after infusion started Because of a history of documented adverse serious drug reaction;Medi Alert bracelet  is recommended  . Penicillins Anaphylaxis    REACTION: anaphylaxis Because of a history of documented adverse serious drug reaction;Medi Alert bracelet  is recommended    Past Medical History, Surgical history, Social history, and Family History were reviewed and updated.  Review of Systems: As above  Physical Exam:  height is 6' (1.829 m) and weight is 250 lb (113.399 kg). His oral temperature is 97.4 F (36.3 C). His blood pressure is 97/54 and his pulse is 64. His respiration is 20.   Well-developed and well-nourished white [name]. He is moderately obese. Head and neck exam shows no ocular or oral lesions. There is less conjunctival inflammation. There is no adenopathy in his neck. Lungs are clear. He has no rales, wheezes or rhonchi. Cardiac exam regular rate and rhythm with no murmurs, rubs or bruits. Abdomen is soft. He has good bowel sounds. He is somewhat obese. There is no palpable liver or spleen tip. Back exam no tenderness over the spine, ribs or hips. Extremities shows some chronic mild nonpitting edema of the legs. Skin exam no rashes. He still has a little bit of a ruddy complexion. Neurological exam is nonfocal.  Lab Results  Component Value Date   WBC 9.4 04/02/2014   HGB 17.0 04/02/2014   HCT 49.6 04/02/2014   MCV 91 04/02/2014   PLT 165 04/02/2014     Chemistry      Component Value Date/Time   NA 138 04/02/2014 1138   NA 138 03/03/2014 1134   K 3.5 04/02/2014 1138   K 3.6 03/03/2014 1134   CL 93* 04/02/2014 1138   CL 103 03/03/2014 1134   CO2 30 04/02/2014 1138   CO2 26 03/03/2014 1134   BUN 46* 04/02/2014 1138   BUN 29* 03/03/2014 1134   CREATININE 2.0* 04/02/2014 1138   CREATININE 1.5 03/03/2014 1134      Component Value Date/Time   CALCIUM 9.5 04/02/2014 1138   CALCIUM 9.2 03/03/2014 1134   ALKPHOS 57 04/02/2014 1138    ALKPHOS 62 03/03/2014 1134   AST 14 04/02/2014 1138   AST 13 03/03/2014 1134   ALT 13 04/02/2014 1138   ALT 8 03/03/2014 1134   BILITOT 1.20 04/02/2014 1138   BILITOT 0.7 03/03/2014 1134         Impression and Plan: [name] is 74 year old woman with polycythemia. Actually believe that he does have polycythemia. Again, his studies are equivocal but clinically, I would say polycythemia is probable.  I still don't think we have to do a bone marrow test on him.  We will get MRI phlebotomy program taking his hematocrit below  45%.  I probably will phlebotomize him weekly for the next 2 weeks.  I will then plan to see him back in about 4-6 weeks.  I spent about 35 minutes with he and his wife. I reviewed all the lab work with him and her and answered all the questions.   Volanda Napoleon, MD 1/13/201612:46 PM

## 2014-04-03 DIAGNOSIS — H903 Sensorineural hearing loss, bilateral: Secondary | ICD-10-CM | POA: Diagnosis not present

## 2014-04-07 ENCOUNTER — Other Ambulatory Visit: Payer: Self-pay | Admitting: Endocrinology

## 2014-04-09 ENCOUNTER — Ambulatory Visit (HOSPITAL_BASED_OUTPATIENT_CLINIC_OR_DEPARTMENT_OTHER): Payer: Medicare Other

## 2014-04-09 VITALS — BP 131/66 | HR 64 | Temp 97.5°F | Resp 18

## 2014-04-09 DIAGNOSIS — D582 Other hemoglobinopathies: Secondary | ICD-10-CM

## 2014-04-09 DIAGNOSIS — D751 Secondary polycythemia: Secondary | ICD-10-CM | POA: Diagnosis not present

## 2014-04-09 NOTE — Patient Instructions (Signed)

## 2014-04-09 NOTE — Progress Notes (Signed)
[name] presents today for phlebotomy per MD orders. Phlebotomy procedure started at 1412 and ended at 1419*. Approximately 500 mls removed. Patient observed for 30 minutes after procedure without any incident. Patient tolerated procedure well. IV needle removed intact.

## 2014-04-10 ENCOUNTER — Telehealth: Payer: Self-pay

## 2014-04-10 NOTE — Telephone Encounter (Addendum)
03/21/14  Weight= 244 lbs  Joaquim Lai, the pts wife, states that the pt continues to have right leg edema and SOB that is better since starting paracentesis. The pt took a Chlorthalidone dose today due to weight greater than 243 lbs. 03/22/14  Weight= 243 lbs  Joaquim Lai reports no changes in the pts right leg edema or SOB status at this time. 03/23/14  Weight= 246 lbs  Joaquim Lai reports no changes in the pts right leg edema or SOB status at this time. The pt took a Chlorthalidone dose today due to weight greater than 243 lbs. 03/24/14  Weight= 246 lbs   Joaquim Lai reports no changes in the pts right leg edema or SOB status at this time. The pt took a Chlorthalidone dose today due to weight greater than 243 lbs. 03/25/14  Weight= 246 lbs   Joaquim Lai reports no changes in the pts right leg edema or SOB status at this time. The pt took a Chlorthalidone dose today due to weight greater than 243 lbs 03/26/14  Weight= 246 lbs   Joaquim Lai reports no changes in the pts right leg edema or SOB status at this time. The pt took a Chlorthalidone dose today due to weight greater than 243 lbs 03/27/14  Weight= 246 lbs.   Joaquim Lai reports no changes in the pts right leg edema or SOB status at this time. The pt took a Chlorthalidone dose today due to weight greater than 243 lbs 03/28/14   Weight= 245 lbs.   Joaquim Lai reports no changes in the pts right leg edema or SOB status at this time. The pt took a Chlorthalidone dose today due to weight greater than 243 lbs 03/29/14  Weight= 245 lbs   Joaquim Lai reports no changes in the pts right leg edema or SOB status at this time. The pt took a Chlorthalidone dose today due to weight greater than 243 lbs 03/30/14  Weight= 246 lbs.  Joaquim Lai reports no changes in the pts right leg edema or SOB status at this time. The pt took a Chlorthalidone dose today due to weight greater than 243 lbs 03/31/14  OV Weight= 252 lbs  Home weight= 245 lbs   Joaquim Lai reports no changes in the pts right leg edema or SOB status at this time.  The pt took a Chlorthalidone dose today due to weight greater than 243 lbs. The pt had an OV with [name] today, no changes were made. 04/01/14  Weight= 243 lbs  Joaquim Lai reports no changes in the pts right leg edema or SOB status at this time.  04/02/14  Weight= 243 lbs  Joaquim Lai reports no changes in the pts right leg edema or SOB status at this time.  04/03/14  Weight= 243 lbs  Joaquim Lai reports no changes in the pts right leg edema or SOB status at this time.  04/04/14  Weight= 245 lbs  Joaquim Lai reports no changes in the pts right leg edema or SOB status at this time. The pt took a Chlorthalidone dose today due to weight greater than 243 lbs 04/05/14  Weight= 243 lbs   Joaquim Lai reports no changes in the pts right leg edema or SOB status at this time. 04/06/14  Weight= 243 lbs.   Joaquim Lai reports no changes in the pts right leg edema or SOB status at this time. 04/07/14  Weight= 245 lbs   Joaquim Lai reports no changes in the pts right leg edema or SOB status at this time. The pt took a Chlorthalidone dose today due to weight  greater than 243 lbs. 04/08/14  Weight= 244 lbs.  Joaquim Lai reports no changes in the pts right leg edema or SOB status at this time. The pt took a Chlorthalidone dose today due to weight greater than 243 lbs 04/09/14  Weight= 246 lbs   Joaquim Lai reports no changes in the pts right leg edema or SOB status at this time. The pt took a Chlorthalidone dose today due to weight greater than 243 lbs 04/10/14  Weight= 244 lbs.  Joaquim Lai reports no changes in the pts right leg edema or SOB status at this time. The pt took a Chlorthalidone dose today due to weight greater than 243 lbs

## 2014-04-10 NOTE — Telephone Encounter (Signed)
**Note De-Identified Ashlinn Hemrick Obfuscation** 03/18/14  Weight= 241 lbs  Joaquim Lai states that the pt continues to have right leg edema and sob at this time. 03/19/14  Weight= 241 lbs  Joaquim Lai reports no change in the pts edema or SOB at this time. 03/20/14  Weight= 241 lbs  Joaquim Lai reports no change in the pts edema or SOB at this time.

## 2014-04-16 ENCOUNTER — Ambulatory Visit: Payer: Medicare Other

## 2014-04-16 ENCOUNTER — Telehealth: Payer: Self-pay

## 2014-04-16 MED ORDER — LOSARTAN POTASSIUM 50 MG PO TABS: 50.0000 mg | ORAL_TABLET | Freq: Every day | ORAL | Status: DC

## 2014-04-16 NOTE — Telephone Encounter (Addendum)
**Note De-Identified [name] Obfuscation** Per [name] the pts wife, [name], is advised to decrease the pts Losartan (Cozaar) to 50 mg daily and to stop taking Amlodipine. [name] verbalized understanding and agrees with plan.

## 2014-04-16 NOTE — Progress Notes (Signed)
Pt reports that he felt faint and weak at home before coming in today. BP 99/43 here. Dr. Marin Olp notified by Micheline Maze, will hold phlebotomy today and repeat labs next week. Pt and wife verbalize understanding.

## 2014-04-16 NOTE — Telephone Encounter (Signed)
**Note De-Identified Sandon Yoho Obfuscation** The pts wife, [name], called to let us know that the pt could not have his phlebotomy done today due to his low BP of 96/48.  [name] states that the pts BP on 04/15/14 was 96/57 with a HR of 79 and that his BP earlier today was 91/58 with a HR of 82. She states that he complained after dinner on 1/26 that he had blurred vision and felt weak. [name] wants to know if his medications need to be adjusted? Please advise.

## 2014-04-21 ENCOUNTER — Other Ambulatory Visit: Payer: Self-pay

## 2014-04-21 MED ORDER — FUROSEMIDE 40 MG PO TABS: 80.0000 mg | ORAL_TABLET | Freq: Two times a day (BID) | ORAL | Status: DC

## 2014-04-21 NOTE — Telephone Encounter (Signed)
Follow Up       Pt returning phone call, pt states they will be home for another 15 minutes before going to an appointment.

## 2014-04-21 NOTE — Telephone Encounter (Signed)
04/11/14  Weight= 246 lbs.  Joaquim Lai, the pts wife, states that the pt continues to have right leg edema and mild SOB at this time. He took a Chlorthalidone dose today due to weight greater than 244 lbs. 04/12/14  Weight= 243 lbs.  Joaquim Lai reports no changes in the pts edema or SOB at this time. 04/13/14  Weight= 246 lbs.  The pt states that he has continued right leg edema and mild SOB at this time. He took a Chlorthalidone dose today due to weight greater than 244 lbs. 04/14/14  Weight= 244 lbs.   Joaquim Lai reports no changes in the pts edema or SOB at this time.He took a Chlorthalidone dose today due to weight greater than 244 lbs. 04/15/14  Weight= 242 lbs.   Joaquim Lai reports no changes in the pts edema or SOB at this time. 04/16/14  Weight= 243 lbs.   Joaquim Lai reports no changes in the pts edema or SOB at this time. The pts BP was too low (91/58) for phlebotomy today. Per [name] the pt is advised to decrease Cozaar to 50 mg daily and to stop taking Amlodipine, the pt verbalized understanding and agrees with plan. 04/17/14  Weight= 244 lbs.   Joaquim Lai reports no changes in the pts edema or SOB at this time. 04/18/14  Weight= 244 lbs.   Joaquim Lai reports no changes in the pts edema or SOB at this time. 04/20/14  Weight= 246 lbs.   Joaquim Lai, the pts wife, states that the pt continues to have right leg edema and mild SOB at this time. He took a Chlorthalidone dose today due to weight greater than 244 lbs.

## 2014-04-22 ENCOUNTER — Other Ambulatory Visit (HOSPITAL_BASED_OUTPATIENT_CLINIC_OR_DEPARTMENT_OTHER): Payer: Medicare Other | Admitting: Lab

## 2014-04-22 ENCOUNTER — Ambulatory Visit: Payer: Medicare Other

## 2014-04-22 DIAGNOSIS — D751 Secondary polycythemia: Secondary | ICD-10-CM

## 2014-04-22 DIAGNOSIS — I482 Chronic atrial fibrillation: Secondary | ICD-10-CM

## 2014-04-22 DIAGNOSIS — E118 Type 2 diabetes mellitus with unspecified complications: Secondary | ICD-10-CM | POA: Diagnosis not present

## 2014-04-22 DIAGNOSIS — D45 Polycythemia vera: Secondary | ICD-10-CM

## 2014-04-22 LAB — CBC WITH DIFFERENTIAL (CANCER CENTER ONLY)
BASO#: 0 10*3/uL (ref 0.0–0.2)
BASO%: 0.4 % (ref 0.0–2.0)
EOS%: 1.4 % (ref 0.0–7.0)
Eosinophils Absolute: 0.1 10*3/uL (ref 0.0–0.5)
HEMATOCRIT: 44.2 % (ref 38.7–49.9)
HGB: 14.8 g/dL (ref 13.0–17.1)
LYMPH#: 1.4 10*3/uL (ref 0.9–3.3)
LYMPH%: 19 % (ref 14.0–48.0)
MCH: 31.4 pg (ref 28.0–33.4)
MCHC: 33.5 g/dL (ref 32.0–35.9)
MCV: 94 fL (ref 82–98)
MONO#: 0.7 10*3/uL (ref 0.1–0.9)
MONO%: 9.9 % (ref 0.0–13.0)
NEUT%: 69.3 % (ref 40.0–80.0)
NEUTROS ABS: 5 10*3/uL (ref 1.5–6.5)
Platelets: 163 10*3/uL (ref 145–400)
RBC: 4.72 10*6/uL (ref 4.20–5.70)
RDW: 14.9 % (ref 11.1–15.7)
WBC: 7.3 10*3/uL (ref 4.0–10.0)

## 2014-04-22 LAB — CMP (CANCER CENTER ONLY)
ALK PHOS: 55 U/L (ref 26–84)
ALT(SGPT): 15 U/L (ref 10–47)
AST: 16 U/L (ref 11–38)
Albumin: 3.6 g/dL (ref 3.3–5.5)
BUN: 30 mg/dL — AB (ref 7–22)
CALCIUM: 9.1 mg/dL (ref 8.0–10.3)
CHLORIDE: 100 meq/L (ref 98–108)
CO2: 29 meq/L (ref 18–33)
Creat: 1.6 mg/dl — ABNORMAL HIGH (ref 0.6–1.2)
Glucose, Bld: 128 mg/dL — ABNORMAL HIGH (ref 73–118)
Potassium: 3.9 mEq/L (ref 3.3–4.7)
Sodium: 144 mEq/L (ref 128–145)
TOTAL PROTEIN: 6.7 g/dL (ref 6.4–8.1)
Total Bilirubin: 1 mg/dl (ref 0.20–1.60)

## 2014-04-22 NOTE — Progress Notes (Signed)
No phlebotomy required d/t pt's HCT >45 at 44.2. Wife and pt aware of next appt. dph

## 2014-04-23 LAB — IRON AND TIBC CHCC
%SAT: 30 % (ref 20–55)
Iron: 110 ug/dL (ref 42–163)
TIBC: 365 ug/dL (ref 202–409)
UIBC: 255 ug/dL (ref 117–376)

## 2014-04-23 LAB — FERRITIN CHCC: Ferritin: 90 ng/ml (ref 22–316)

## 2014-05-05 ENCOUNTER — Inpatient Hospital Stay (HOSPITAL_COMMUNITY): Payer: Medicare Other

## 2014-05-05 ENCOUNTER — Inpatient Hospital Stay (HOSPITAL_COMMUNITY)
Admission: EM | Admit: 2014-05-05 | Discharge: 2014-05-10 | DRG: 291 | Disposition: A | Discharge status: Home / Self Care | Payer: Medicare Other | Attending: Internal Medicine | Admitting: Internal Medicine

## 2014-05-05 ENCOUNTER — Encounter (HOSPITAL_COMMUNITY): Payer: Self-pay | Admitting: Emergency Medicine

## 2014-05-05 ENCOUNTER — Emergency Department (HOSPITAL_COMMUNITY): Payer: Medicare Other

## 2014-05-05 DIAGNOSIS — I7389 Other specified peripheral vascular diseases: Secondary | ICD-10-CM | POA: Diagnosis not present

## 2014-05-05 DIAGNOSIS — J90 Pleural effusion, not elsewhere classified: Secondary | ICD-10-CM | POA: Diagnosis not present

## 2014-05-05 DIAGNOSIS — Z7982 Long term (current) use of aspirin: Secondary | ICD-10-CM

## 2014-05-05 DIAGNOSIS — E1165 Type 2 diabetes mellitus with hyperglycemia: Secondary | ICD-10-CM | POA: Diagnosis not present

## 2014-05-05 DIAGNOSIS — I482 Chronic atrial fibrillation: Secondary | ICD-10-CM | POA: Diagnosis present

## 2014-05-05 DIAGNOSIS — I5023 Acute on chronic systolic (congestive) heart failure: Secondary | ICD-10-CM | POA: Diagnosis not present

## 2014-05-05 DIAGNOSIS — J441 Chronic obstructive pulmonary disease with (acute) exacerbation: Secondary | ICD-10-CM | POA: Diagnosis present

## 2014-05-05 DIAGNOSIS — I5043 Acute on chronic combined systolic (congestive) and diastolic (congestive) heart failure: Secondary | ICD-10-CM | POA: Diagnosis not present

## 2014-05-05 DIAGNOSIS — I4891 Unspecified atrial fibrillation: Secondary | ICD-10-CM | POA: Diagnosis present

## 2014-05-05 DIAGNOSIS — I872 Venous insufficiency (chronic) (peripheral): Secondary | ICD-10-CM | POA: Diagnosis not present

## 2014-05-05 DIAGNOSIS — N179 Acute kidney failure, unspecified: Secondary | ICD-10-CM | POA: Diagnosis not present

## 2014-05-05 DIAGNOSIS — R509 Fever, unspecified: Secondary | ICD-10-CM | POA: Diagnosis not present

## 2014-05-05 DIAGNOSIS — J9601 Acute respiratory failure with hypoxia: Secondary | ICD-10-CM | POA: Diagnosis present

## 2014-05-05 DIAGNOSIS — Z8672 Personal history of thrombophlebitis: Secondary | ICD-10-CM | POA: Diagnosis not present

## 2014-05-05 DIAGNOSIS — I959 Hypotension, unspecified: Secondary | ICD-10-CM | POA: Diagnosis not present

## 2014-05-05 DIAGNOSIS — K439 Ventral hernia without obstruction or gangrene: Secondary | ICD-10-CM | POA: Diagnosis not present

## 2014-05-05 DIAGNOSIS — I481 Persistent atrial fibrillation: Secondary | ICD-10-CM | POA: Diagnosis not present

## 2014-05-05 DIAGNOSIS — M25562 Pain in left knee: Secondary | ICD-10-CM

## 2014-05-05 DIAGNOSIS — E876 Hypokalemia: Secondary | ICD-10-CM | POA: Diagnosis present

## 2014-05-05 DIAGNOSIS — R05 Cough: Secondary | ICD-10-CM | POA: Diagnosis not present

## 2014-05-05 DIAGNOSIS — M109 Gout, unspecified: Secondary | ICD-10-CM | POA: Diagnosis present

## 2014-05-05 DIAGNOSIS — E1151 Type 2 diabetes mellitus with diabetic peripheral angiopathy without gangrene: Secondary | ICD-10-CM | POA: Diagnosis present

## 2014-05-05 DIAGNOSIS — IMO0002 Reserved for concepts with insufficient information to code with codable children: Secondary | ICD-10-CM | POA: Diagnosis present

## 2014-05-05 DIAGNOSIS — J449 Chronic obstructive pulmonary disease, unspecified: Secondary | ICD-10-CM | POA: Diagnosis not present

## 2014-05-05 DIAGNOSIS — R339 Retention of urine, unspecified: Secondary | ICD-10-CM | POA: Diagnosis present

## 2014-05-05 DIAGNOSIS — N183 Chronic kidney disease, stage 3 (moderate): Secondary | ICD-10-CM | POA: Diagnosis present

## 2014-05-05 DIAGNOSIS — I517 Cardiomegaly: Secondary | ICD-10-CM | POA: Diagnosis not present

## 2014-05-05 DIAGNOSIS — E785 Hyperlipidemia, unspecified: Secondary | ICD-10-CM | POA: Diagnosis not present

## 2014-05-05 DIAGNOSIS — Z8679 Personal history of other diseases of the circulatory system: Secondary | ICD-10-CM | POA: Diagnosis not present

## 2014-05-05 DIAGNOSIS — Z95810 Presence of automatic (implantable) cardiac defibrillator: Secondary | ICD-10-CM

## 2014-05-05 DIAGNOSIS — M1990 Unspecified osteoarthritis, unspecified site: Secondary | ICD-10-CM | POA: Diagnosis not present

## 2014-05-05 DIAGNOSIS — Z951 Presence of aortocoronary bypass graft: Secondary | ICD-10-CM

## 2014-05-05 DIAGNOSIS — Z7901 Long term (current) use of anticoagulants: Secondary | ICD-10-CM | POA: Diagnosis not present

## 2014-05-05 DIAGNOSIS — I248 Other forms of acute ischemic heart disease: Secondary | ICD-10-CM | POA: Diagnosis not present

## 2014-05-05 DIAGNOSIS — I129 Hypertensive chronic kidney disease with stage 1 through stage 4 chronic kidney disease, or unspecified chronic kidney disease: Secondary | ICD-10-CM | POA: Diagnosis present

## 2014-05-05 DIAGNOSIS — I7121 Aneurysm of the ascending aorta, without rupture: Secondary | ICD-10-CM

## 2014-05-05 DIAGNOSIS — I509 Heart failure, unspecified: Secondary | ICD-10-CM | POA: Diagnosis not present

## 2014-05-05 DIAGNOSIS — I712 Thoracic aortic aneurysm, without rupture: Secondary | ICD-10-CM | POA: Diagnosis not present

## 2014-05-05 DIAGNOSIS — R338 Other retention of urine: Secondary | ICD-10-CM | POA: Diagnosis not present

## 2014-05-05 DIAGNOSIS — R059 Cough, unspecified: Secondary | ICD-10-CM

## 2014-05-05 DIAGNOSIS — J9600 Acute respiratory failure, unspecified whether with hypoxia or hypercapnia: Secondary | ICD-10-CM

## 2014-05-05 DIAGNOSIS — R0902 Hypoxemia: Secondary | ICD-10-CM | POA: Diagnosis present

## 2014-05-05 DIAGNOSIS — E1142 Type 2 diabetes mellitus with diabetic polyneuropathy: Secondary | ICD-10-CM | POA: Diagnosis not present

## 2014-05-05 DIAGNOSIS — I255 Ischemic cardiomyopathy: Secondary | ICD-10-CM | POA: Diagnosis present

## 2014-05-05 DIAGNOSIS — I714 Abdominal aortic aneurysm, without rupture: Secondary | ICD-10-CM | POA: Diagnosis present

## 2014-05-05 DIAGNOSIS — I2510 Atherosclerotic heart disease of native coronary artery without angina pectoris: Secondary | ICD-10-CM | POA: Diagnosis present

## 2014-05-05 DIAGNOSIS — Z87891 Personal history of nicotine dependence: Secondary | ICD-10-CM | POA: Diagnosis not present

## 2014-05-05 DIAGNOSIS — R52 Pain, unspecified: Secondary | ICD-10-CM

## 2014-05-05 DIAGNOSIS — R079 Chest pain, unspecified: Secondary | ICD-10-CM | POA: Diagnosis not present

## 2014-05-05 DIAGNOSIS — M25561 Pain in right knee: Secondary | ICD-10-CM | POA: Diagnosis not present

## 2014-05-05 DIAGNOSIS — R0602 Shortness of breath: Secondary | ICD-10-CM | POA: Diagnosis not present

## 2014-05-05 DIAGNOSIS — E119 Type 2 diabetes mellitus without complications: Secondary | ICD-10-CM | POA: Diagnosis not present

## 2014-05-05 DIAGNOSIS — I252 Old myocardial infarction: Secondary | ICD-10-CM | POA: Diagnosis not present

## 2014-05-05 LAB — CBG MONITORING, ED: GLUCOSE-CAPILLARY: 180 mg/dL — AB (ref 70–99)

## 2014-05-05 LAB — CBC WITH DIFFERENTIAL/PLATELET
BASOS ABS: 0 10*3/uL (ref 0.0–0.1)
Basophils Relative: 0 % (ref 0–1)
Eosinophils Absolute: 0 10*3/uL (ref 0.0–0.7)
Eosinophils Relative: 0 % (ref 0–5)
HCT: 41.8 % (ref 39.0–52.0)
HEMOGLOBIN: 14.3 g/dL (ref 13.0–17.0)
LYMPHS ABS: 0.7 10*3/uL (ref 0.7–4.0)
LYMPHS PCT: 8 % — AB (ref 12–46)
MCH: 31.7 pg (ref 26.0–34.0)
MCHC: 34.2 g/dL (ref 30.0–36.0)
MCV: 92.7 fL (ref 78.0–100.0)
MONO ABS: 1 10*3/uL (ref 0.1–1.0)
MONOS PCT: 11 % (ref 3–12)
NEUTROS PCT: 81 % — AB (ref 43–77)
Neutro Abs: 7.7 10*3/uL (ref 1.7–7.7)
PLATELETS: 176 10*3/uL (ref 150–400)
RBC: 4.51 MIL/uL (ref 4.22–5.81)
RDW: 14.6 % (ref 11.5–15.5)
WBC: 9.5 10*3/uL (ref 4.0–10.5)

## 2014-05-05 LAB — BASIC METABOLIC PANEL
Anion gap: 9 (ref 5–15)
BUN: 49 mg/dL — ABNORMAL HIGH (ref 6–23)
CHLORIDE: 100 mmol/L (ref 96–112)
CO2: 29 mmol/L (ref 19–32)
Calcium: 8.7 mg/dL (ref 8.4–10.5)
Creatinine, Ser: 2.11 mg/dL — ABNORMAL HIGH (ref 0.50–1.35)
GFR calc Af Amer: 34 mL/min — ABNORMAL LOW (ref >90)
GFR calc non Af Amer: 29 mL/min — ABNORMAL LOW (ref >90)
Glucose, Bld: 208 mg/dL — ABNORMAL HIGH (ref 70–99)
Potassium: 3.1 mmol/L — ABNORMAL LOW (ref 3.5–5.1)
Sodium: 138 mmol/L (ref 135–145)

## 2014-05-05 LAB — I-STAT TROPONIN, ED: TROPONIN I, POC: 0.07 ng/mL (ref 0.00–0.08)

## 2014-05-05 LAB — URINALYSIS, ROUTINE W REFLEX MICROSCOPIC
Bilirubin Urine: NEGATIVE
Glucose, UA: NEGATIVE mg/dL
Ketones, ur: NEGATIVE mg/dL
Leukocytes, UA: NEGATIVE
NITRITE: NEGATIVE
PROTEIN: NEGATIVE mg/dL
SPECIFIC GRAVITY, URINE: 1.011 (ref 1.005–1.030)
UROBILINOGEN UA: 1 mg/dL (ref 0.0–1.0)
pH: 5.5 (ref 5.0–8.0)

## 2014-05-05 LAB — PROTIME-INR
INR: 1.6 — ABNORMAL HIGH (ref 0.00–1.49)
PROTHROMBIN TIME: 19.2 s — AB (ref 11.6–15.2)

## 2014-05-05 LAB — BRAIN NATRIURETIC PEPTIDE: B NATRIURETIC PEPTIDE 5: 607.3 pg/mL — AB (ref 0.0–100.0)

## 2014-05-05 LAB — GLUCOSE, CAPILLARY
Glucose-Capillary: 174 mg/dL — ABNORMAL HIGH (ref 70–99)
Glucose-Capillary: 115 mg/dL — ABNORMAL HIGH (ref 70–99)

## 2014-05-05 LAB — TROPONIN I
TROPONIN I: 0.2 ng/mL — AB (ref <0.031)
Troponin I: 0.09 ng/mL — ABNORMAL HIGH (ref <0.031)

## 2014-05-05 LAB — D-DIMER, QUANTITATIVE (NOT AT ARMC): D DIMER QUANT: 1.62 ug{FEU}/mL — AB (ref 0.00–0.48)

## 2014-05-05 LAB — URINE MICROSCOPIC-ADD ON

## 2014-05-05 MED ORDER — WARFARIN - PHARMACIST DOSING INPATIENT
Freq: Every day | Status: DC
  Administered 2014-05-05: 21:00:00

## 2014-05-05 MED ORDER — ASPIRIN EC 81 MG PO TBEC
81.0000 mg | DELAYED_RELEASE_TABLET | Freq: Every day | ORAL | Status: DC
  Administered 2014-05-06 – 2014-05-10 (×5): 81 mg via ORAL
  Filled 2014-05-05 (×6): qty 1

## 2014-05-05 MED ORDER — IPRATROPIUM-ALBUTEROL 0.5-2.5 (3) MG/3ML IN SOLN
3.0000 mL | RESPIRATORY_TRACT | Status: DC
  Administered 2014-05-05 (×2): 3 mL via RESPIRATORY_TRACT
  Filled 2014-05-05: qty 3

## 2014-05-05 MED ORDER — ONDANSETRON HCL 4 MG/2ML IJ SOLN: 4.0000 mg | Freq: Four times a day (QID) | INTRAMUSCULAR | Status: DC | PRN

## 2014-05-05 MED ORDER — NAPHAZOLINE-PHENIRAMINE 0.025-0.3 % OP SOLN
1.0000 [drp] | Freq: Every day | OPHTHALMIC | Status: DC | PRN
  Filled 2014-05-05: qty 15

## 2014-05-05 MED ORDER — ATORVASTATIN CALCIUM 10 MG PO TABS
10.0000 mg | ORAL_TABLET | Freq: Every day | ORAL | Status: DC
  Administered 2014-05-05 – 2014-05-10 (×6): 10 mg via ORAL
  Filled 2014-05-05 (×6): qty 1

## 2014-05-05 MED ORDER — HEPARIN (PORCINE) IN NACL 100-0.45 UNIT/ML-% IJ SOLN
2200.0000 [IU]/h | INTRAMUSCULAR | Status: DC
  Administered 2014-05-05: 1200 [IU]/h via INTRAVENOUS
  Administered 2014-05-06: 1600 [IU]/h via INTRAVENOUS
  Administered 2014-05-07: 2000 [IU]/h via INTRAVENOUS
  Filled 2014-05-05 (×4): qty 250

## 2014-05-05 MED ORDER — TECHNETIUM TO 99M ALBUMIN AGGREGATED
6.0000 | Freq: Once | INTRAVENOUS | Status: AC | PRN
  Administered 2014-05-05: 6 via INTRAVENOUS

## 2014-05-05 MED ORDER — NITROGLYCERIN 0.4 MG SL SUBL: 0.4000 mg | SUBLINGUAL_TABLET | SUBLINGUAL | Status: DC | PRN

## 2014-05-05 MED ORDER — FUROSEMIDE 10 MG/ML IJ SOLN
40.0000 mg | Freq: Two times a day (BID) | INTRAMUSCULAR | Status: AC
  Administered 2014-05-05 – 2014-05-07 (×4): 40 mg via INTRAVENOUS
  Filled 2014-05-05 (×4): qty 4

## 2014-05-05 MED ORDER — INSULIN ASPART 100 UNIT/ML ~~LOC~~ SOLN
0.0000 [IU] | Freq: Three times a day (TID) | SUBCUTANEOUS | Status: DC
  Administered 2014-05-05 – 2014-05-08 (×5): 3 [IU] via SUBCUTANEOUS
  Administered 2014-05-08 – 2014-05-09 (×3): 2 [IU] via SUBCUTANEOUS
  Administered 2014-05-10: 3 [IU] via SUBCUTANEOUS

## 2014-05-05 MED ORDER — IPRATROPIUM-ALBUTEROL 0.5-2.5 (3) MG/3ML IN SOLN
3.0000 mL | Freq: Four times a day (QID) | RESPIRATORY_TRACT | Status: DC
  Administered 2014-05-05 – 2014-05-06 (×3): 3 mL via RESPIRATORY_TRACT
  Filled 2014-05-05 (×3): qty 3

## 2014-05-05 MED ORDER — AZITHROMYCIN 250 MG PO TABS
250.0000 mg | ORAL_TABLET | Freq: Every day | ORAL | Status: DC
  Administered 2014-05-06 – 2014-05-07 (×2): 250 mg via ORAL
  Filled 2014-05-05 (×3): qty 1

## 2014-05-05 MED ORDER — POTASSIUM CHLORIDE CRYS ER 20 MEQ PO TBCR
40.0000 meq | EXTENDED_RELEASE_TABLET | Freq: Two times a day (BID) | ORAL | Status: AC
  Administered 2014-05-05 – 2014-05-06 (×3): 40 meq via ORAL
  Filled 2014-05-05 (×4): qty 2

## 2014-05-05 MED ORDER — ACETAMINOPHEN 325 MG PO TABS
650.0000 mg | ORAL_TABLET | ORAL | Status: DC | PRN
  Administered 2014-05-07 – 2014-05-09 (×5): 650 mg via ORAL
  Filled 2014-05-05 (×4): qty 2

## 2014-05-05 MED ORDER — HEPARIN SODIUM (PORCINE) 5000 UNIT/ML IJ SOLN
5000.0000 [IU] | Freq: Three times a day (TID) | INTRAMUSCULAR | Status: DC
  Administered 2014-05-05: 5000 [IU] via SUBCUTANEOUS
  Filled 2014-05-05: qty 1

## 2014-05-05 MED ORDER — SODIUM CHLORIDE 0.9 % IJ SOLN
3.0000 mL | Freq: Two times a day (BID) | INTRAMUSCULAR | Status: DC
  Administered 2014-05-07 – 2014-05-09 (×6): 3 mL via INTRAVENOUS

## 2014-05-05 MED ORDER — TECHNETIUM TC 99M DIETHYLENETRIAME-PENTAACETIC ACID: 40.0000 | Freq: Once | INTRAVENOUS | Status: AC | PRN

## 2014-05-05 MED ORDER — FUROSEMIDE 10 MG/ML IJ SOLN
40.0000 mg | Freq: Once | INTRAMUSCULAR | Status: AC
  Administered 2014-05-05: 40 mg via INTRAVENOUS
  Filled 2014-05-05: qty 4

## 2014-05-05 MED ORDER — POLYVINYL ALCOHOL 1.4 % OP SOLN
2.0000 [drp] | Freq: Every day | OPHTHALMIC | Status: DC
  Administered 2014-05-06 – 2014-05-09 (×4): 2 [drp] via OPHTHALMIC
  Filled 2014-05-05: qty 15

## 2014-05-05 MED ORDER — ACETAMINOPHEN 325 MG PO TABS
650.0000 mg | ORAL_TABLET | ORAL | Status: DC
  Administered 2014-05-06 – 2014-05-10 (×3): 650 mg via ORAL
  Filled 2014-05-05 (×4): qty 2

## 2014-05-05 MED ORDER — AZITHROMYCIN 500 MG PO TABS
500.0000 mg | ORAL_TABLET | Freq: Every day | ORAL | Status: AC
  Administered 2014-05-05: 500 mg via ORAL
  Filled 2014-05-05 (×2): qty 1

## 2014-05-05 MED ORDER — CARVEDILOL 6.25 MG PO TABS
6.2500 mg | ORAL_TABLET | Freq: Two times a day (BID) | ORAL | Status: DC
  Administered 2014-05-05 – 2014-05-10 (×10): 6.25 mg via ORAL
  Filled 2014-05-05 (×11): qty 1

## 2014-05-05 MED ORDER — GABAPENTIN 300 MG PO CAPS
300.0000 mg | ORAL_CAPSULE | Freq: Three times a day (TID) | ORAL | Status: DC | PRN
  Administered 2014-05-07 – 2014-05-09 (×4): 300 mg via ORAL
  Filled 2014-05-05 (×7): qty 1

## 2014-05-05 MED ORDER — ALPRAZOLAM 0.25 MG PO TABS
0.2500 mg | ORAL_TABLET | Freq: Two times a day (BID) | ORAL | Status: DC | PRN
  Administered 2014-05-06 – 2014-05-09 (×6): 0.25 mg via ORAL
  Filled 2014-05-05 (×6): qty 1

## 2014-05-05 MED ORDER — WARFARIN SODIUM 5 MG PO TABS
5.0000 mg | ORAL_TABLET | Freq: Once | ORAL | Status: AC
  Administered 2014-05-05: 5 mg via ORAL
  Filled 2014-05-05 (×2): qty 1

## 2014-05-05 MED ORDER — INSULIN ASPART 100 UNIT/ML ~~LOC~~ SOLN: 0.0000 [IU] | Freq: Every day | SUBCUTANEOUS | Status: DC

## 2014-05-05 MED ORDER — IPRATROPIUM-ALBUTEROL 0.5-2.5 (3) MG/3ML IN SOLN
3.0000 mL | RESPIRATORY_TRACT | Status: DC
  Filled 2014-05-05: qty 3

## 2014-05-05 MED ORDER — INSULIN DETEMIR 100 UNIT/ML ~~LOC~~ SOLN
30.0000 [IU] | Freq: Every day | SUBCUTANEOUS | Status: DC
  Administered 2014-05-06 – 2014-05-09 (×4): 30 [IU] via SUBCUTANEOUS
  Filled 2014-05-05 (×6): qty 0.3

## 2014-05-05 MED ORDER — HYDROCOD POLST-CHLORPHEN POLST 10-8 MG/5ML PO LQCR: 5.0000 mL | Freq: Every evening | ORAL | Status: DC | PRN

## 2014-05-05 MED ORDER — SODIUM CHLORIDE 0.9 % IJ SOLN: 3.0000 mL | INTRAMUSCULAR | Status: DC | PRN

## 2014-05-05 MED ORDER — ASPIRIN 81 MG PO TABS: 81.0000 mg | ORAL_TABLET | ORAL | Status: DC

## 2014-05-05 MED ORDER — SODIUM CHLORIDE 0.9 % IV SOLN: 250.0000 mL | INTRAVENOUS | Status: DC | PRN

## 2014-05-05 NOTE — Progress Notes (Signed)
ANTICOAGULATION CONSULT NOTE - Initial Consult  Pharmacy Consult for Warfarin Indication: atrial fibrillation  Allergies  Allergen Reactions  . Avelox [Moxifloxacin Hcl In Nacl] Swelling, Rash and Other (See Comments)    Patient became hypotensive after infusion started Because of a history of documented adverse serious drug reaction;Medi Alert bracelet  is recommended  . Penicillins Anaphylaxis    REACTION: anaphylaxis Because of a history of documented adverse serious drug reaction;Medi Alert bracelet  is recommended    Patient Measurements: Height: 6' (182.9 cm) Weight: 243 lb (110.224 kg) IBW/kg (Calculated) : 77.6  Vital Signs: BP: 116/69 mmHg (02/15 1445) Pulse Rate: 66 (02/15 1445)  Labs:  Recent Labs  05/05/14 1117  HGB 14.3  HCT 41.8  PLT 176  LABPROT 19.2*  INR 1.60*  CREATININE 2.11*    Estimated Creatinine Clearance: 40 mL/min (by C-G formula based on Cr of 2.11).   Medical History: Past Medical History  Diagnosis Date  . Diabetes mellitus   . Hyperlipidemia   . Hypertension   . Pilonidal cyst   . Atrial fibrillation     Previous long-term amiodarone therapy with multiple cardioversions / amiodarone stopped September, 2009  . Atrial flutter     Started November, 2010, Left-sided and cannot ablate  . Left atrial thrombus     Remote past... cardioversions done since that time  . Wide-complex tachycardia   . Left ventricular ejection fraction less than 40%   . Gout   . AAA (abdominal aortic aneurysm)     Surgical repair  . Discolored skin   . S/P ICD (internal cardiac defibrillator) procedure     Dr. Lovena Le 2009... by the pacing  . SOB (shortness of breath)     Large left effusion/ thoracentesis/hospitalization/November, 2011... exudated.. cytology negative.. Dr.Wert.. no proof of mesothelioma  . Pericardial effusion   . Pleural effusion     Large loculated effusion on the left side November, 2011. This was tapped. It was exudative. Cytology  revealed no cancer no proof of mesothelioma area pulmonary team felt that no further workup was needed  . S/P AAA repair   . Spinal stenosis     Surgery Dr.Elsner  . CAD (coronary artery disease)     Catheterization July, 2008... name and vein grafts patent but low cardiac output  . Warfarin anticoagulation   . Cardiomyopathy     Ischemic... ICD  . Combined systolic and diastolic CHF   . Venous insufficiency     Toe discoloration chronic  . Mitral regurgitation     Mild echo  . Aortic valve sclerosis   . Nasal drainage     Chronic  . Alcohol ingestion of more than four drinks per week     Excess beer  not a dependency problem  . Chronotropic incompetence     IV pacing rate adjusted  . Thrombophlebitis of superficial veins of upper extremities     Possible venous stenosis from defibrillator  . Pericardial effusion     November, 2011 .. decreased during hospitalization  . Eye abnormality     Ophthalmologist questions a clot in one of his eyes, May, 2012  . Overweight(278.02)     November, 2012  . Pleural thickening   . Ejection fraction     Ejection fraction has varied over time from 35-50%.,, Echoes are technically very difficult,,, EF 50%, echo, May 25, 2011, technically very difficult  . Drug therapy     Redness and swelling with Avelox infusion May 24, 2011  .  COPD (chronic obstructive pulmonary disease)   . Myocardial infarction   . Spinal cord stimulator status     October, 2013  . Carotid artery disease     Doppler, December, 2013, 0-39% bilateral  . Pneumonia   . Arthritis   . Ventral hernia     April, 2014, result of his abdominal surgery  . Bony abnormality     Patient's manubrium is slightly displaced to the right    Medications:  Prescriptions prior to admission  Medication Sig Dispense Refill Last Dose  . ACCU-CHEK FASTCLIX LANCETS MISC USE ONE LANCET TO CHECK BLOOD SUGAR THREE TIMES DAILY 102 each 5 05/04/2014 at Unknown time  . acetaminophen (TYLENOL)  325 MG tablet Take 975 mg by mouth every morning.   05/05/2014 at Unknown time  . allopurinol (ZYLOPRIM) 100 MG tablet Take 1 tablet (100 mg total) by mouth daily. 90 tablet 3 05/04/2014 at Unknown time  . amLODipine (NORVASC) 5 MG tablet Take 2.5 mg by mouth daily.   05/05/2014 at Unknown time  . aspirin 81 MG tablet Take 81 mg by mouth once a week.    05/04/2014 at Unknown time  . atorvastatin (LIPITOR) 10 MG tablet Take 1 tablet (10 mg total) by mouth daily. 90 tablet 3 05/04/2014 at Unknown time  . carboxymethylcellulose (REFRESH PLUS) 0.5 % SOLN 2 drops daily.   05/05/2014 at Unknown time  . carvedilol (COREG) 6.25 MG tablet Take 1 tablet (6.25 mg total) by mouth 2 (two) times daily. 180 tablet 1 05/05/2014 at 8:30 am  . chlorthalidone (HYGROTON) 25 MG tablet Take 12.5 mg by mouth daily. Take when weight reaches 243 lbs. or greater only 90 tablet 3 05/04/2014 at Unknown time  . Cholecalciferol 10000 UNITS CAPS Take 1,000 Units by mouth daily.   05/05/2014 at Unknown time  . colchicine (COLCRYS) 0.6 MG tablet Take 1 tablet (0.6 mg total) by mouth as needed (for gout). 90 tablet 0 05/04/2014 at Unknown time  . furosemide (LASIX) 40 MG tablet Take 2 tablets (80 mg total) by mouth 2 (two) times daily. 360 tablet 1 05/05/2014 at Unknown time  . gabapentin (NEURONTIN) 300 MG capsule 3 x daily as needed for leg pain 270 capsule 3 05/05/2014 at Unknown time  . insulin lispro (HUMALOG KWIKPEN) 100 UNIT/ML KiwkPen Inject 0.12-0.14 mLs (12-14 Units total) into the skin 2 (two) times daily. 5 pen 3 05/04/2014 at Unknown time  . ipratropium (ATROVENT) 0.06 % nasal spray Place 1 spray into both nostrils as directed.    05/05/2014 at Unknown time  . LEVEMIR FLEXTOUCH 100 UNIT/ML Pen INJECT 35 UNITS INTO THE SKIN EVERY NIGHT AT BEDTIME 15 mL 5 05/04/2014 at Unknown time  . losartan (COZAAR) 50 MG tablet Take 1 tablet (50 mg total) by mouth daily. 30 tablet 3   . metFORMIN (GLUCOPHAGE-XR) 750 MG 24 hr tablet TAKE ONE TABLET BY  MOUTH ONCE DAILY WITH BREAKFAST 30 tablet 5 05/05/2014 at Unknown time  . naphazoline-pheniramine (NAPHCON-A) 0.025-0.3 % ophthalmic solution Place 1 drop into both eyes daily as needed for irritation.   Past Month at Unknown time  . OVER THE COUNTER MEDICATION Take by mouth as needed (Cough).   05/04/2014 at Unknown time  . Potassium Chloride ER 20 MEQ TBCR Take 20 mcg by mouth 2 (two) times daily. TAKES 1 1/2 TAB = 30 MEQ.  TWICE A DAY   TOTAL IS 60 MEQ DAILY   05/05/2014 at Unknown time  . warfarin (COUMADIN) 2.5 MG tablet  TAKE AS DIRECTED BY  ANTICOAGULATION  CLINIC (Patient taking differently: Take 2.5 mg daily except Wednesday, take 5 mg.) 130 tablet 0 05/04/2014 at Unknown time  . ACCU-CHEK SMARTVIEW test strip    Taking  . nitroGLYCERIN (NITROSTAT) 0.4 MG SL tablet Place 0.4 mg under the tongue every 5 (five) minutes as needed for chest pain (MAX 3 TABLETS).    Taking   Scheduled:  . [START ON 05/06/2014] acetaminophen  975 mg Oral BH-q7a  . aspirin EC  81 mg Oral Daily  . aspirin  81 mg Oral Weekly  . atorvastatin  10 mg Oral Daily  . carboxymethylcellulose  2 drop Both Eyes Daily  . carvedilol  6.25 mg Oral BID  . furosemide  40 mg Intravenous Q12H  . heparin  5,000 Units Subcutaneous 3 times per day  . insulin aspart  0-15 Units Subcutaneous TID WC  . insulin aspart  0-5 Units Subcutaneous QHS  . insulin detemir  30 Units Subcutaneous QHS  . ipratropium-albuterol  3 mL Nebulization Q6H  . Potassium Chloride ER  40 mcg Oral BID  . sodium chloride  3 mL Intravenous Q12H   Infusions:    Assessment: 74yo male with history of Afib presents with productive cough x 4 days. Pharmacy is consulted to dose warfarin for atrial fibrillation. INR on admission is SUBtherapeutic at 1.6 on PTA warfarin with last dose 2/14 and CBC is wnl.  PTA warfarin 5mg  on Wednesday and 2.5mg  AODs.  Goal of Therapy:  INR 2-3 Monitor platelets by anticoagulation protocol: Yes   Plan:  Warfarin 5mg  PO  tonight x 1 Daily INR Continue to monitor H&H and platelets  Monitor s/sx of bleeding  Andrey Cota. Diona Foley, PharmD Clinical Pharmacist Pager [phone] 05/05/2014,3:30 PM

## 2014-05-05 NOTE — Progress Notes (Signed)
VQ scan completed and is negative for PE. Troponin slightly elevated. Pt has NO active chest pain. Heparin was started in lieu of sub therapeutic INR on Coumadin. Pt is chronically anticoagulated for hx Afib. Doubt ACS-likely demand from CHF decompensation. Continue Heparin. Pharmacy to manage Heparin and Coumadin.

## 2014-05-05 NOTE — Progress Notes (Signed)
ANTICOAGULATION CONSULT NOTE - Initial Consult  Pharmacy Consult for Heparin (and continue Warfarin) Indication: atrial fibrillation; SUBtheraputic INR. Now concerned for PE - VQ scan pending.  Allergies  Allergen Reactions  . Avelox [Moxifloxacin Hcl In Nacl] Swelling, Rash and Other (See Comments)    Patient became hypotensive after infusion started Because of a history of documented adverse serious drug reaction;Medi Alert bracelet  is recommended  . Penicillins Anaphylaxis    REACTION: anaphylaxis Because of a history of documented adverse serious drug reaction;Medi Alert bracelet  is recommended    Patient Measurements: Height: 6' (182.9 cm) Weight: 248 lb 4.8 oz (112.628 kg) IBW/kg (Calculated) : 77.6 Heparin Dosing Weight: 101.6 kg Vital Signs: Temp: 98.6 F (37 C) (02/15 1640) Temp Source: Oral (02/15 1640) BP: 124/68 mmHg (02/15 1640) Pulse Rate: 68 (02/15 1640)  Labs:  Recent Labs  05/05/14 1117 05/05/14 1622  HGB 14.3  --   HCT 41.8  --   PLT 176  --   LABPROT 19.2*  --   INR 1.60*  --   CREATININE 2.11*  --   TROPONINI  --  0.09*    Estimated Creatinine Clearance: 40.4 mL/min (by C-G formula based on Cr of 2.11).   Medical History: Past Medical History  Diagnosis Date  . Diabetes mellitus   . Hyperlipidemia   . Hypertension   . Pilonidal cyst   . Atrial fibrillation     Previous long-term amiodarone therapy with multiple cardioversions / amiodarone stopped September, 2009  . Atrial flutter     Started November, 2010, Left-sided and cannot ablate  . Left atrial thrombus     Remote past... cardioversions done since that time  . Wide-complex tachycardia   . Left ventricular ejection fraction less than 40%   . Gout   . AAA (abdominal aortic aneurysm)     Surgical repair  . Discolored skin   . S/P ICD (internal cardiac defibrillator) procedure     Dr. Lovena Le 2009... by the pacing  . SOB (shortness of breath)     Large left effusion/  thoracentesis/hospitalization/November, 2011... exudated.. cytology negative.. Dr.Wert.. no proof of mesothelioma  . Pericardial effusion   . Pleural effusion     Large loculated effusion on the left side November, 2011. This was tapped. It was exudative. Cytology revealed no cancer no proof of mesothelioma area pulmonary team felt that no further workup was needed  . S/P AAA repair   . Spinal stenosis     Surgery Dr.Elsner  . CAD (coronary artery disease)     Catheterization July, 2008... name and vein grafts patent but low cardiac output  . Warfarin anticoagulation   . Cardiomyopathy     Ischemic... ICD  . Combined systolic and diastolic CHF   . Venous insufficiency     Toe discoloration chronic  . Mitral regurgitation     Mild echo  . Aortic valve sclerosis   . Nasal drainage     Chronic  . Alcohol ingestion of more than four drinks per week     Excess beer  not a dependency problem  . Chronotropic incompetence     IV pacing rate adjusted  . Thrombophlebitis of superficial veins of upper extremities     Possible venous stenosis from defibrillator  . Pericardial effusion     November, 2011 .. decreased during hospitalization  . Eye abnormality     Ophthalmologist questions a clot in one of his eyes, May, 2012  . Overweight(278.02)  November, 2012  . Pleural thickening   . Ejection fraction     Ejection fraction has varied over time from 35-50%.,, Echoes are technically very difficult,,, EF 50%, echo, May 25, 2011, technically very difficult  . Drug therapy     Redness and swelling with Avelox infusion May 24, 2011  . COPD (chronic obstructive pulmonary disease)   . Myocardial infarction   . Spinal cord stimulator status     October, 2013  . Carotid artery disease     Doppler, December, 2013, 0-39% bilateral  . Pneumonia   . Arthritis   . Ventral hernia     April, 2014, result of his abdominal surgery  . Bony abnormality     Patient's manubrium is slightly  displaced to the right    Medications:  Prescriptions prior to admission  Medication Sig Dispense Refill Last Dose  . ACCU-CHEK FASTCLIX LANCETS MISC USE ONE LANCET TO CHECK BLOOD SUGAR THREE TIMES DAILY 102 each 5 05/04/2014 at Unknown time  . acetaminophen (TYLENOL) 325 MG tablet Take 975 mg by mouth every morning.   05/05/2014 at Unknown time  . allopurinol (ZYLOPRIM) 100 MG tablet Take 1 tablet (100 mg total) by mouth daily. 90 tablet 3 05/04/2014 at Unknown time  . amLODipine (NORVASC) 5 MG tablet Take 2.5 mg by mouth daily.   05/05/2014 at Unknown time  . aspirin 81 MG tablet Take 81 mg by mouth once a week.    05/04/2014 at Unknown time  . atorvastatin (LIPITOR) 10 MG tablet Take 1 tablet (10 mg total) by mouth daily. 90 tablet 3 05/04/2014 at Unknown time  . carboxymethylcellulose (REFRESH PLUS) 0.5 % SOLN 2 drops daily.   05/05/2014 at Unknown time  . carvedilol (COREG) 6.25 MG tablet Take 1 tablet (6.25 mg total) by mouth 2 (two) times daily. 180 tablet 1 05/05/2014 at 8:30 am  . chlorthalidone (HYGROTON) 25 MG tablet Take 12.5 mg by mouth daily. Take when weight reaches 243 lbs. or greater only 90 tablet 3 05/04/2014 at Unknown time  . Cholecalciferol 10000 UNITS CAPS Take 1,000 Units by mouth daily.   05/05/2014 at Unknown time  . colchicine (COLCRYS) 0.6 MG tablet Take 1 tablet (0.6 mg total) by mouth as needed (for gout). 90 tablet 0 05/04/2014 at Unknown time  . furosemide (LASIX) 40 MG tablet Take 2 tablets (80 mg total) by mouth 2 (two) times daily. 360 tablet 1 05/05/2014 at Unknown time  . gabapentin (NEURONTIN) 300 MG capsule 3 x daily as needed for leg pain 270 capsule 3 05/05/2014 at Unknown time  . insulin lispro (HUMALOG KWIKPEN) 100 UNIT/ML KiwkPen Inject 0.12-0.14 mLs (12-14 Units total) into the skin 2 (two) times daily. 5 pen 3 05/04/2014 at Unknown time  . ipratropium (ATROVENT) 0.06 % nasal spray Place 1 spray into both nostrils as directed.    05/05/2014 at Unknown time  .  LEVEMIR FLEXTOUCH 100 UNIT/ML Pen INJECT 35 UNITS INTO THE SKIN EVERY NIGHT AT BEDTIME 15 mL 5 05/04/2014 at Unknown time  . losartan (COZAAR) 50 MG tablet Take 1 tablet (50 mg total) by mouth daily. 30 tablet 3   . metFORMIN (GLUCOPHAGE-XR) 750 MG 24 hr tablet TAKE ONE TABLET BY MOUTH ONCE DAILY WITH BREAKFAST 30 tablet 5 05/05/2014 at Unknown time  . naphazoline-pheniramine (NAPHCON-A) 0.025-0.3 % ophthalmic solution Place 1 drop into both eyes daily as needed for irritation.   Past Month at Unknown time  . OVER THE COUNTER MEDICATION Take by mouth as  needed (Cough).   05/04/2014 at Unknown time  . Potassium Chloride ER 20 MEQ TBCR Take 20 mcg by mouth 2 (two) times daily. TAKES 1 1/2 TAB = 30 MEQ.  TWICE A DAY   TOTAL IS 60 MEQ DAILY   05/05/2014 at Unknown time  . warfarin (COUMADIN) 2.5 MG tablet TAKE AS DIRECTED BY  ANTICOAGULATION  CLINIC (Patient taking differently: Take 2.5 mg daily except Wednesday, take 5 mg.) 130 tablet 0 05/04/2014 at Unknown time  . ACCU-CHEK SMARTVIEW test strip    Taking  . nitroGLYCERIN (NITROSTAT) 0.4 MG SL tablet Place 0.4 mg under the tongue every 5 (five) minutes as needed for chest pain (MAX 3 TABLETS).    Taking   Scheduled:  . [START ON 05/06/2014] acetaminophen  650 mg Oral BH-q7a  . aspirin EC  81 mg Oral Daily  . atorvastatin  10 mg Oral Daily  . azithromycin  500 mg Oral Daily   Followed by  . [START ON 05/06/2014] azithromycin  250 mg Oral Daily  . carvedilol  6.25 mg Oral BID  . furosemide  40 mg Intravenous Q12H  . insulin aspart  0-15 Units Subcutaneous TID WC  . insulin aspart  0-5 Units Subcutaneous QHS  . insulin detemir  30 Units Subcutaneous QHS  . ipratropium-albuterol  3 mL Nebulization Q6H  . [START ON 05/06/2014] polyvinyl alcohol  2 drop Both Eyes Daily  . potassium chloride SA  40 mEq Oral BID  . sodium chloride  3 mL Intravenous Q12H  . warfarin  5 mg Oral ONCE-1800  . Warfarin - Pharmacist Dosing Inpatient   Does not apply q1800     Assessment: SUBtheraputic INR (1.6) in this 74yo male with history of Afib presents with productive cough x 4 days. On Coumandin prior to admission and continues with pharmacy protocol for coumadin. Now concerned for PE - VQ scan pending. Pharmacy consulted to bridge with IV heparin.   PTA warfarin 5mg  on Wednesday and 2.5mg  on all other days.  Patient admitted today 05/05/14 with acute respiratory failure with hypoxia as well as Afib, ARF (SCr =2.11), acute exacerbation of CHF, COPD exacerbation, acute urinary retention and diabetes. D-Dimer  Elevated at 1.62.  Patient recently received heparin 5000 units SQ at 16:00 today.   Goal of Therapy:  Heparin level 0.3-0.7 units/ml Monitor platelets by anticoagulation protocol: Yes  INR 2-3   Plan:  Start IV heparin 1200 units/hr Check heparin level in 6 hours. Daily heparin level and CBC.     Nicole Cella, RPh Clinical Pharmacist Pager: [phone]  05/05/2014,7:03 PM

## 2014-05-05 NOTE — ED Notes (Signed)
Ambulated Pt with walker assistance, without O2, while on Pulse Ox. Pt was 95% before standing while on O2. Pt's Pulse Ox dropped down to 85% while ambulating without O2. Once Pt was placed back in the bed Pulse Ox was 91%. Pt stated he felt very tired after walking.

## 2014-05-05 NOTE — ED Notes (Signed)
EMS - Patient coming from home with c/o of reproductive cough x 4 days.  Rhonchi in all fields and diminished areas.  Hx of CHF.  152/78, 72 HR, 14 respirations.  Patient is Atrial paced with A. Fib.

## 2014-05-05 NOTE — ED Provider Notes (Signed)
CSN: [id number]     Arrival date & time 05/05/14  1009 History   First MD Initiated Contact with Patient 05/05/14 1014     Chief Complaint  Patient presents with  . Cough     (Consider location/radiation/quality/duration/timing/severity/associated sxs/prior Treatment) HPI [name] is a 74 y.o. male with multiple medical problems including significant smoking history COPD, CHF comes in for evaluation of cough. Patient states he began to have an increased productive cough approximately 4 days ago after returning from the beach. He reports that his wife had chronic bronchitis and he feels that he has it now also. He reports constant coughing with productive sputum that resembles "raw oysters". He denies any hemoptysis. He also reports associated bilateral leg swelling that has increased from his norm. He reports generalized weakness. He reports having to sleep in his recliner for the past 4 days in order to breathe. He is not on home oxygen. He denies fevers, chest pain, nausea or vomiting, numbness He reports regularly following up with Dr. Dola Argyle, his cardiologist and Dr. [name] his PCP.  Past Medical History  Diagnosis Date  . Diabetes mellitus   . Hyperlipidemia   . Hypertension   . Pilonidal cyst   . Atrial fibrillation     Previous long-term amiodarone therapy with multiple cardioversions / amiodarone stopped September, 2009  . Atrial flutter     Started November, 2010, Left-sided and cannot ablate  . Left atrial thrombus     Remote past... cardioversions done since that time  . Wide-complex tachycardia   . Left ventricular ejection fraction less than 40%   . Gout   . AAA (abdominal aortic aneurysm)     Surgical repair  . Discolored skin   . S/P ICD (internal cardiac defibrillator) procedure     Dr. Lovena Le 2009... by the pacing  . SOB (shortness of breath)     Large left effusion/ thoracentesis/hospitalization/November, 2011... exudated.. cytology  negative.. Dr.Wert.. no proof of mesothelioma  . Pericardial effusion   . Pleural effusion     Large loculated effusion on the left side November, 2011. This was tapped. It was exudative. Cytology revealed no cancer no proof of mesothelioma area pulmonary team felt that no further workup was needed  . S/P AAA repair   . Spinal stenosis     Surgery Dr.Elsner  . CAD (coronary artery disease)     Catheterization July, 2008... name and vein grafts patent but low cardiac output  . Warfarin anticoagulation   . Cardiomyopathy     Ischemic... ICD  . Combined systolic and diastolic CHF   . Venous insufficiency     Toe discoloration chronic  . Mitral regurgitation     Mild echo  . Aortic valve sclerosis   . Nasal drainage     Chronic  . Alcohol ingestion of more than four drinks per week     Excess beer  not a dependency problem  . Chronotropic incompetence     IV pacing rate adjusted  . Thrombophlebitis of superficial veins of upper extremities     Possible venous stenosis from defibrillator  . Pericardial effusion     November, 2011 .. decreased during hospitalization  . Eye abnormality     Ophthalmologist questions a clot in one of his eyes, May, 2012  . Overweight(278.02)     November, 2012  . Pleural thickening   . Ejection fraction     Ejection fraction has varied over time from 35-50%.,, Echoes are  technically very difficult,,, EF 50%, echo, May 25, 2011, technically very difficult  . Drug therapy     Redness and swelling with Avelox infusion May 24, 2011  . COPD (chronic obstructive pulmonary disease)   . Myocardial infarction   . Spinal cord stimulator status     October, 2013  . Carotid artery disease     Doppler, December, 2013, 0-39% bilateral  . Pneumonia   . Arthritis   . Ventral hernia     April, 2014, result of his abdominal surgery  . Bony abnormality     Patient's manubrium is slightly displaced to the right   Past Surgical History  Procedure Laterality  Date  . Colonoscopy w/ polypectomy    . Lumbar fusion    . Pilonidal cyst removal    . Surgery scrotal / testicular    . Coronary artery bypass graft  2004  . Abdominal aortic aneurysm repair  11/2002  . Bi-ventricular pacemaker insertion (crt-p)  02-11-2013    Pt with previously implanted MDT CRTD downgraded to CRTP by Dr Lovena Le 02-11-13  . Back surgery    . Incision and drainage abscess / hematoma of bursa / knee / thigh    . Implantable cardioverter defibrillator (icd) generator change N/A 02/11/2013    Procedure: ICD GENERATOR CHANGE;  Surgeon: Evans Lance, MD;  Location[facility] CATH LAB;  Service: Cardiovascular;  Laterality: N/A;   Family History  Problem Relation Age of Onset  . Hypertension Mother   . Stroke Mother   . Diabetes Father   . Coronary artery disease Father   . Other Father     DVT   History  Substance Use Topics  . Smoking status: Former Smoker -- 3.00 packs/day for 39 years    Types: Cigarettes    Start date: 05/24/1956    Quit date: 03/22/1995  . Smokeless tobacco: Never Used     Comment: quit smoking 18 years ago  . Alcohol Use: 1.2 oz/week    2 Cans of beer per week     Comment: beer    Review of Systems A 10 point review of systems was completed and was negative except for pertinent positives and negatives as mentioned in the history of present illness     Allergies  Avelox and Penicillins  Home Medications   Prior to Admission medications   Medication Sig Start Date End Date Taking? Authorizing Provider  ACCU-CHEK FASTCLIX LANCETS MISC USE ONE LANCET TO CHECK BLOOD SUGAR THREE TIMES DAILY 02/05/14  Yes Elayne Snare, MD  acetaminophen (TYLENOL) 325 MG tablet Take 975 mg by mouth every morning.   Yes Historical Provider, MD  allopurinol (ZYLOPRIM) 100 MG tablet Take 1 tablet (100 mg total) by mouth daily. 09/03/13  Yes Hendricks Limes, MD  amLODipine (NORVASC) 5 MG tablet Take 2.5 mg by mouth daily.   Yes Historical Provider, MD  aspirin 81 MG  tablet Take 81 mg by mouth once a week.    Yes Historical Provider, MD  atorvastatin (LIPITOR) 10 MG tablet Take 1 tablet (10 mg total) by mouth daily. 07/08/13  Yes Hendricks Limes, MD  carboxymethylcellulose (REFRESH PLUS) 0.5 % SOLN 2 drops daily.   Yes Historical Provider, MD  carvedilol (COREG) 6.25 MG tablet Take 1 tablet (6.25 mg total) by mouth 2 (two) times daily. 08/30/13  Yes Carlena Bjornstad, MD  chlorthalidone (HYGROTON) 25 MG tablet Take 12.5 mg by mouth daily. Take when weight reaches 243 lbs. or greater only  10/09/13  Yes Carlena Bjornstad, MD  Cholecalciferol 10000 UNITS CAPS Take 1,000 Units by mouth daily.   Yes Historical Provider, MD  colchicine (COLCRYS) 0.6 MG tablet Take 1 tablet (0.6 mg total) by mouth as needed (for gout). 12/03/13  Yes Hendricks Limes, MD  furosemide (LASIX) 40 MG tablet Take 2 tablets (80 mg total) by mouth 2 (two) times daily. 04/21/14  Yes Carlena Bjornstad, MD  gabapentin (NEURONTIN) 300 MG capsule 3 x daily as needed for leg pain 03/06/14  Yes Elayne Snare, MD  insulin lispro (HUMALOG KWIKPEN) 100 UNIT/ML KiwkPen Inject 0.12-0.14 mLs (12-14 Units total) into the skin 2 (two) times daily. 03/06/14  Yes Elayne Snare, MD  ipratropium (ATROVENT) 0.06 % nasal spray Place 1 spray into both nostrils as directed.  01/30/13  Yes Historical Provider, MD  LEVEMIR FLEXTOUCH 100 UNIT/ML Pen INJECT 35 UNITS INTO THE SKIN EVERY NIGHT AT BEDTIME 04/07/14  Yes Elayne Snare, MD  losartan (COZAAR) 50 MG tablet Take 1 tablet (50 mg total) by mouth daily. 04/16/14  Yes Carlena Bjornstad, MD  metFORMIN (GLUCOPHAGE-XR) 750 MG 24 hr tablet TAKE ONE TABLET BY MOUTH ONCE DAILY WITH BREAKFAST 01/01/14  Yes Elayne Snare, MD  naphazoline-pheniramine (NAPHCON-A) 0.025-0.3 % ophthalmic solution Place 1 drop into both eyes daily as needed for irritation.   Yes Historical Provider, MD  OVER THE COUNTER MEDICATION Take by mouth as needed (Cough).   Yes Historical Provider, MD  Potassium Chloride ER 20 MEQ TBCR  Take 20 mcg by mouth 2 (two) times daily. TAKES 1 1/2 TAB = 30 MEQ.  TWICE A DAY   TOTAL IS 60 MEQ DAILY   Yes Historical Provider, MD  warfarin (COUMADIN) 2.5 MG tablet TAKE AS DIRECTED BY  ANTICOAGULATION  CLINIC Patient taking differently: Take 2.5 mg daily except Wednesday, take 5 mg. 03/11/14  Yes Carlena Bjornstad, MD  ACCU-CHEK SMARTVIEW test strip  02/05/14   Historical Provider, MD  nitroGLYCERIN (NITROSTAT) 0.4 MG SL tablet Place 0.4 mg under the tongue every 5 (five) minutes as needed for chest pain (MAX 3 TABLETS).     Historical Provider, MD   BP 106/60 mmHg  Pulse 73  Resp 23  Ht 6' (1.829 m)  Wt 243 lb (110.224 kg)  BMI 32.95 kg/m2  SpO2 93% Physical Exam  Constitutional: He is oriented to person, place, and time. He appears well-developed and well-nourished.  HENT:  Head: Normocephalic and atraumatic.  Mouth/Throat: Oropharynx is clear and moist.  Eyes: Conjunctivae are normal. Pupils are equal, round, and reactive to light. Right eye exhibits no discharge. Left eye exhibits no discharge. No scleral icterus.  Neck: Neck supple.  Cardiovascular: Normal rate, regular rhythm and normal heart sounds.   Pulmonary/Chest: Effort normal. No respiratory distress.  Patient has adventitious lung sounds in all fields. There is wheezing and rhonchorous sounds in the bilateral bases. Expiratory wheezing in bilateral upper fields. No tachypnea or evidence of respiratory distress.  Abdominal: Soft. There is no tenderness.  Musculoskeletal: He exhibits no tenderness.  Neurological: He is alert and oriented to person, place, and time.  Cranial Nerves II-XII grossly intact  Skin: Skin is warm and dry. No rash noted.  Psychiatric: He has a normal mood and affect.  Nursing note and vitals reviewed.   ED Course  Procedures (including critical care time) Labs Review Labs Reviewed  BASIC METABOLIC PANEL - Abnormal; Notable for the following:    Potassium 3.1 (*)    Glucose, Bld  208 (*)     BUN 49 (*)    Creatinine, Ser 2.11 (*)    GFR calc non Af Amer 29 (*)    GFR calc Af Amer 34 (*)    All other components within normal limits  CBC WITH DIFFERENTIAL/PLATELET - Abnormal; Notable for the following:    Neutrophils Relative % 81 (*)    Lymphocytes Relative 8 (*)    All other components within normal limits  BRAIN NATRIURETIC PEPTIDE - Abnormal; Notable for the following:    B Natriuretic Peptide 607.3 (*)    All other components within normal limits  PROTIME-INR - Abnormal; Notable for the following:    Prothrombin Time 19.2 (*)    INR 1.60 (*)    All other components within normal limits  CBG MONITORING, ED - Abnormal; Notable for the following:    Glucose-Capillary 180 (*)    All other components within normal limits  D-DIMER, QUANTITATIVE  I-STAT TROPOININ, ED    Imaging Review Dg Chest 2 View  05/05/2014   CLINICAL DATA:  Cough, congestion 4 days.  No fever.  No chest pain.  EXAM: CHEST  2 VIEW  COMPARISON:  01/01/2014  FINDINGS: Mild bilateral interstitial thickening. Right basilar calcified pleural plaque. There is no focal parenchymal opacity, pleural effusion, or pneumothorax. There is stable cardiomegaly. There is evidence of prior CABG. There is a 3-lead cardiac pacer.  The osseous structures are unremarkable.  IMPRESSION: Cardiomegaly without congestive failure.  Calcified right pleural plaque as can be seen with asbestos exposure.   Electronically Signed   By: Kathreen Devoid   On: 05/05/2014 11:00     EKG Interpretation   Date/Time:  Monday May 05 2014 10:22:08 EST Ventricular Rate:  68 PR Interval:    QRS Duration: 177 QT Interval:  474 QTC Calculation: 504 R Axis:   -79 Text Interpretation:  Atrial fibrillation Ventricular premature complex  Left axis deviation RBBB and LAFB LVH with secondary repolarization  abnormality not paced unlike last ekg Confirmed by NANAVATI, MD, ANKIT  [phone]) on 05/05/2014 10:55:33 AM     Meds given in  ED:  Medications  ipratropium-albuterol (DUONEB) 0.5-2.5 (3) MG/3ML nebulizer solution 3 mL (3 mLs Nebulization Given 05/05/14 1205)    New Prescriptions   No medications on file   Filed Vitals:   05/05/14 1230 05/05/14 1245 05/05/14 1300 05/05/14 1315  BP: 109/64 104/69 113/64 106/60  Pulse: 66 65 74 73  Resp: 25 18 22 23   Height:      Weight:      SpO2: 91% 92% 92% 93%    MDM  Vitals stable - WNL -afebrile Pt resting comfortably in ED. reports he feels better after breathing treatments, but despite breathing treatments he still has persistent wheezing. Patient ambulated with walker and pulse ox dropped from 95% to 85% while ambulating without O2. Patient returned to bed and 2 L nasal cannula and saturations improved to 91%. Patient voices feeling very tired. Increased peripheral leg edema despite patient taking home dose of 160 mg of Lasix. Labwork significant for elevated creatinine of 2.11 up from 1.613 days ago. BUN is 49 up from 30. BNP 607.3. Patient's INR is 1.6, not therapeutic. Pending D dimer. Chest x-ray shows acute cardiomegaly without congestive heart failure.  Discussed patient presentation and ED course with attending, Dr. Kathrynn Humble. Patient symptoms likely multifactorial and due to COPD exacerbation as well as CHF exacerbation. Acute renal failure found incidentally. Decision made to consult internal medicine and  have patient admitted. Consult IM, Imogene Burn, PA-C, patient admitted   Final diagnoses:  AKI (acute kidney injury)  Acute respiratory failure with hypoxia       Verl Dicker, PA-C 05/05/14 [place], MD 05/06/14 [phone]

## 2014-05-05 NOTE — H&P (Signed)
[facility] History and Physical                                                                                    Anand Tejada, is a 74 y.o. male  MRN: [id number]   DOB - [birth year]  Admit Date - 05/05/2014  Outpatient Primary MD for the patient is [name], MD  With History of -  Past Medical History  Diagnosis Date  . Diabetes mellitus   . Hyperlipidemia   . Hypertension   . Pilonidal cyst   . Atrial fibrillation     Previous long-term amiodarone therapy with multiple cardioversions / amiodarone stopped September, 2009  . Atrial flutter     Started November, 2010, Left-sided and cannot ablate  . Left atrial thrombus     Remote past... cardioversions done since that time  . Wide-complex tachycardia   . Left ventricular ejection fraction less than 40%   . Gout   . AAA (abdominal aortic aneurysm)     Surgical repair  . Discolored skin   . S/P ICD (internal cardiac defibrillator) procedure     Dr. Lovena Le 2009... by the pacing  . SOB (shortness of breath)     Large left effusion/ thoracentesis/hospitalization/November, 2011... exudated.. cytology negative.. Dr.Wert.. no proof of mesothelioma  . Pericardial effusion   . Pleural effusion     Large loculated effusion on the left side November, 2011. This was tapped. It was exudative. Cytology revealed no cancer no proof of mesothelioma area pulmonary team felt that no further workup was needed  . S/P AAA repair   . Spinal stenosis     Surgery Dr.Elsner  . CAD (coronary artery disease)     Catheterization July, 2008... name and vein grafts patent but low cardiac output  . Warfarin anticoagulation   . Cardiomyopathy     Ischemic... ICD  . Combined systolic and diastolic CHF   . Venous insufficiency     Toe discoloration chronic  . Mitral regurgitation     Mild echo  . Aortic valve sclerosis   . Nasal drainage     Chronic  . Alcohol ingestion of more than four drinks per week     Excess beer  not a  dependency problem  . Chronotropic incompetence     IV pacing rate adjusted  . Thrombophlebitis of superficial veins of upper extremities     Possible venous stenosis from defibrillator  . Pericardial effusion     November, 2011 .. decreased during hospitalization  . Eye abnormality     Ophthalmologist questions a clot in one of his eyes, May, 2012  . Overweight(278.02)     November, 2012  . Pleural thickening   . Ejection fraction     Ejection fraction has varied over time from 35-50%.,, Echoes are technically very difficult,,, EF 50%, echo, May 25, 2011, technically very difficult  . Drug therapy     Redness and swelling with Avelox infusion May 24, 2011  . COPD (chronic obstructive pulmonary disease)   . Myocardial infarction   . Spinal cord stimulator status     October, 2013  . Carotid artery disease  Doppler, December, 2013, 0-39% bilateral  . Pneumonia   . Arthritis   . Ventral hernia     April, 2014, result of his abdominal surgery  . Bony abnormality     Patient's manubrium is slightly displaced to the right      Past Surgical History  Procedure Laterality Date  . Colonoscopy w/ polypectomy    . Lumbar fusion    . Pilonidal cyst removal    . Surgery scrotal / testicular    . Coronary artery bypass graft  2004  . Abdominal aortic aneurysm repair  11/2002  . Bi-ventricular pacemaker insertion (crt-p)  02-11-2013    Pt with previously implanted MDT CRTD downgraded to CRTP by Dr Lovena Le 02-11-13  . Back surgery    . Incision and drainage abscess / hematoma of bursa / knee / thigh    . Implantable cardioverter defibrillator (icd) generator change N/A 02/11/2013    Procedure: ICD GENERATOR CHANGE;  Surgeon: Evans Lance, MD;  Location: [facility] CATH LAB;  Service: Cardiovascular;  Laterality: N/A;    in for   Chief Complaint  Patient presents with  . Cough     HPI  Brenen Beigel  is a 74 y.o. male, with a past medical history of COPD, congestive heart  failure (with an LVEF of 25-30% in November 2015), valvular disease, and MI. He presents to the emergency department today complaining of increased productive cough for the last 4 days, today he became acutely short of breath. He reports that he has been unable to urinate this morning, but he has increased swelling in his legs and feet, and that he is coughing up sputum that looks like oysters. He states initially he ignored his symptoms because his wife was ill with a respiratory infection and he thought he had the same. He does not wear oxygen at home.   In the emergency department his oxygen saturation is 85% ambulating on room air. His creatinine has risen from a baseline of 1.5 to 2.11.  potassium is 3.1, BNP is elevated at 607.3   Review  of Systems   In addition to the HPI above,  +Orthopnea, increased lower extremity edema. No Headache, No changes with Vision or hearing, No problems swallowing food or Liquids, No Abdominal pain, No Nausea or Vomiting, Bowel movements are regular, No Blood in stool or Urine, +Oliguria.   No new skin rashes or bruises, No new joints pains-aches,   A full 10 point Review of Systems was done, except as stated above, all other Review of Systems were negative.  Social History History  Substance Use Topics  . Smoking status: Former Smoker -- 3.00 packs/day for 39 years    Types: Cigarettes    Start date: 05/24/1956    Quit date: 03/22/1995  . Smokeless tobacco: Never Used     Comment: quit smoking 18 years ago  . Alcohol Use: 1.2 oz/week    2 Cans of beer per week     Comment: beer    Family History Family History  Problem Relation Age of Onset  . Hypertension Mother   . Stroke Mother   . Diabetes Father   . Coronary artery disease Father   . Other Father     DVT  His mother passed with heart disease, his father had diabetes.   Prior to Admission medications   Medication Sig Start Date End Date Taking? Authorizing Provider  ACCU-CHEK  FASTCLIX LANCETS MISC USE ONE LANCET TO CHECK BLOOD SUGAR THREE  TIMES DAILY 02/05/14  Yes Elayne Snare, MD  acetaminophen (TYLENOL) 325 MG tablet Take 975 mg by mouth every morning.   Yes Historical Provider, MD  allopurinol (ZYLOPRIM) 100 MG tablet Take 1 tablet (100 mg total) by mouth daily. 09/03/13  Yes Hendricks Limes, MD  amLODipine (NORVASC) 5 MG tablet Take 2.5 mg by mouth daily.   Yes Historical Provider, MD  aspirin 81 MG tablet Take 81 mg by mouth once a week.    Yes Historical Provider, MD  atorvastatin (LIPITOR) 10 MG tablet Take 1 tablet (10 mg total) by mouth daily. 07/08/13  Yes Hendricks Limes, MD  carboxymethylcellulose (REFRESH PLUS) 0.5 % SOLN 2 drops daily.   Yes Historical Provider, MD  carvedilol (COREG) 6.25 MG tablet Take 1 tablet (6.25 mg total) by mouth 2 (two) times daily. 08/30/13  Yes Carlena Bjornstad, MD  chlorthalidone (HYGROTON) 25 MG tablet Take 12.5 mg by mouth daily. Take when weight reaches 243 lbs. or greater only 10/09/13  Yes Carlena Bjornstad, MD  Cholecalciferol 10000 UNITS CAPS Take 1,000 Units by mouth daily.   Yes Historical Provider, MD  colchicine (COLCRYS) 0.6 MG tablet Take 1 tablet (0.6 mg total) by mouth as needed (for gout). 12/03/13  Yes Hendricks Limes, MD  furosemide (LASIX) 40 MG tablet Take 2 tablets (80 mg total) by mouth 2 (two) times daily. 04/21/14  Yes Carlena Bjornstad, MD  gabapentin (NEURONTIN) 300 MG capsule 3 x daily as needed for leg pain 03/06/14  Yes Elayne Snare, MD  insulin lispro (HUMALOG KWIKPEN) 100 UNIT/ML KiwkPen Inject 0.12-0.14 mLs (12-14 Units total) into the skin 2 (two) times daily. 03/06/14  Yes Elayne Snare, MD  ipratropium (ATROVENT) 0.06 % nasal spray Place 1 spray into both nostrils as directed.  01/30/13  Yes Historical Provider, MD  LEVEMIR FLEXTOUCH 100 UNIT/ML Pen INJECT 35 UNITS INTO THE SKIN EVERY NIGHT AT BEDTIME 04/07/14  Yes Elayne Snare, MD  losartan (COZAAR) 50 MG tablet Take 1 tablet (50 mg total) by mouth daily. 04/16/14   Yes Carlena Bjornstad, MD  metFORMIN (GLUCOPHAGE-XR) 750 MG 24 hr tablet TAKE ONE TABLET BY MOUTH ONCE DAILY WITH BREAKFAST 01/01/14  Yes Elayne Snare, MD  naphazoline-pheniramine (NAPHCON-A) 0.025-0.3 % ophthalmic solution Place 1 drop into both eyes daily as needed for irritation.   Yes Historical Provider, MD  OVER THE COUNTER MEDICATION Take by mouth as needed (Cough).   Yes Historical Provider, MD  Potassium Chloride ER 20 MEQ TBCR Take 20 mcg by mouth 2 (two) times daily. TAKES 1 1/2 TAB = 30 MEQ.  TWICE A DAY   TOTAL IS 60 MEQ DAILY   Yes Historical Provider, MD  warfarin (COUMADIN) 2.5 MG tablet TAKE AS DIRECTED BY  ANTICOAGULATION  CLINIC Patient taking differently: Take 2.5 mg daily except Wednesday, take 5 mg. 03/11/14  Yes Carlena Bjornstad, MD  ACCU-CHEK SMARTVIEW test strip  02/05/14   Historical Provider, MD  nitroGLYCERIN (NITROSTAT) 0.4 MG SL tablet Place 0.4 mg under the tongue every 5 (five) minutes as needed for chest pain (MAX 3 TABLETS).     Historical Provider, MD    Allergies  Allergen Reactions  . Avelox [Moxifloxacin Hcl In Nacl] Swelling, Rash and Other (See Comments)    Patient became hypotensive after infusion started Because of a history of documented adverse serious drug reaction;Medi Alert bracelet  is recommended  . Penicillins Anaphylaxis    REACTION: anaphylaxis Because of a history of documented adverse  serious drug reaction;Medi Alert bracelet  is recommended    Physical Exam  Vitals  Blood pressure 124/68, pulse 68, temperature 98.6 F (37 C), temperature source Oral, resp. rate 18, height 6' (1.829 m), weight 112.628 kg (248 lb 4.8 oz), SpO2 95 %.   General:  well-developed, elderly male,  lying in bed in NAD, in mild increased work of breathing, severe cough bringing up large amounts of sputum.   Psych:  Normal affect and insight, Not Suicidal or Homicidal, Awake Alert, Oriented X 3.  Neuro:   No F.N deficits, ALL C.Nerves Intact, Strength 5/5 all 4  extremities, Sensation intact all 4 extremities.  ENT:  Ears and Eyes appear Normal, Conjunctivae clear, PER. Moist oral mucosa without erythema or exudates.  Neck:  Supple, No lymphadenopathy appreciated  Respiratory:  positive rales, positive rhonchi, positive expiratory wheeze   Cardiac:  RRR, No Murmurs,  noted, no JVD, positive bilateral lower extremity edema 2+ pitting    Abdomen:  Positive bowel sounds, Soft, Non tender, Non distended,  No masses appreciated  Skin:  No Cyanosis, Normal Skin Turgor, No Skin Rash or Bruise.  Extremities:  Able to move all 4. 5/5 strength in each,  no effusions.  Data Review  CBC  Recent Labs Lab 05/05/14 1117  WBC 9.5  HGB 14.3  HCT 41.8  PLT 176  MCV 92.7  MCH 31.7  MCHC 34.2  RDW 14.6  LYMPHSABS 0.7  MONOABS 1.0  EOSABS 0.0  BASOSABS 0.0    Chemistries   Recent Labs Lab 05/05/14 1117  NA 138  K 3.1*  CL 100  CO2 29  GLUCOSE 208*  BUN 49*  CREATININE 2.11*  CALCIUM 8.7    Coagulation profile  Recent Labs Lab 05/05/14 1117  INR 1.60*     Recent Labs  05/05/14 1413  DDIMER 1.62*    Cardiac Enzymes  Recent Labs Lab 05/05/14 1622  TROPONINI 0.09*    Invalid input(s): POCBNP  Urinalysis    Component Value Date/Time   COLORURINE LT. YELLOW 02/01/2013 1050   APPEARANCEUR CLEAR 02/01/2013 1050   LABSPEC 1.010 02/01/2013 1050   PHURINE 7.0 02/01/2013 1050   GLUCOSEU NEGATIVE 02/01/2013 1050   GLUCOSEU NEGATIVE 05/23/2012 1900   HGBUR NEGATIVE 02/01/2013 1050   BILIRUBINUR NEGATIVE 02/01/2013 1050   KETONESUR NEGATIVE 02/01/2013 1050   PROTEINUR NEGATIVE 05/23/2012 1900   UROBILINOGEN 0.2 02/01/2013 1050   NITRITE NEGATIVE 02/01/2013 1050   LEUKOCYTESUR NEGATIVE 02/01/2013 1050    Imaging results:   Dg Chest 2 View  05/05/2014   CLINICAL DATA:  Cough, congestion 4 days.  No fever.  No chest pain.  EXAM: CHEST  2 VIEW  COMPARISON:  01/01/2014  FINDINGS: Mild bilateral interstitial  thickening. Right basilar calcified pleural plaque. There is no focal parenchymal opacity, pleural effusion, or pneumothorax. There is stable cardiomegaly. There is evidence of prior CABG. There is a 3-lead cardiac pacer.  The osseous structures are unremarkable.  IMPRESSION: Cardiomegaly without congestive failure.  Calcified right pleural plaque as can be seen with asbestos exposure.   Electronically Signed   By: Kathreen Devoid   On: 05/05/2014 11:00    My personal review of EKG: A. fib   Assessment & Plan  Principal Problem:   Acute respiratory failure with hypoxia Active Problems:   Atrial fibrillation   Hypoxia   Acute exacerbation of congestive heart failure   Acute renal failure   COPD exacerbation   Acute urinary retention   Diabetes  Acute respiratory failure with hypoxia  secondary to primarily CHF exacerbation with some degree of COPD exacerbation. We will begin diuresis. Placed on supplemental oxygen as needed  Acute heart failure exacerbation LVEF is 25-30%. We'll begin diuresis. Admitted under heart failure protocol. Consult to the heart failure team. Heart healthy diet, daily weights, strict I's and O's. We'll cycle troponins. Continue Low dose carvedilol.  ACE inhibitor avoided due to acute renal failure.  COPD exacerbation Start Z-Pak.  Scheduled nebulizer therapy.  Did not start steroids at this point as I feel his primary problem is acute heart failure. Sputum culture requested  Acute renal failure Will hold nephrotoxic medications including chlorthalidone, colchicine, allopurinol Currently diuresing due to heart failure. Will monitor creatinine closely.   Relative hypotension Blood pressures are soft in the emergency department. Will hold amlodipine and Cozaar. Continue Coreg. Add back blood pressure medications as his blood pressure improves.  Acute urinary retention the patient was bladder scanned in the ER and was retaining 460 mL of urine. Will order  Foley catheter placement, and start Lasix.  Diabetes mellitus Continue Levemir at 30 units daily at bedtime, and sliding scale moderate. Check hemoglobin A1c. Titrate insulin as necessary.  Atrial fibrillation  rate controlled. INR subtherapeutic. Have ordered heparin per pharmacy.  Elevated d-dimer (1.62) Ordered in the ER. He was not initially concerned for PE as the patient's creatinine is elevated and he is on Coumadin. However Coumadin is subtherapeutic, and patient is sedentary. Will order stat VQ scan.   DVT Prophylaxis: heparin and Coumadin   AM Labs Ordered, also please review Full Orders  Family Communication:    patient is alert and oriented and understands this plan of care.   Code Status:   full code   Condition:   guarded   Time spent in minutes : [place],  PA-C on 05/05/2014 at 6:56 PM  Between 7am to 7pm - Pager - [phone]  After 7pm go to [email or web address] - password [email or web address]  And look for the night coverage person covering me after hours  [facility]

## 2014-05-06 ENCOUNTER — Other Ambulatory Visit: Payer: Self-pay | Admitting: *Deleted

## 2014-05-06 DIAGNOSIS — R338 Other retention of urine: Secondary | ICD-10-CM

## 2014-05-06 DIAGNOSIS — I509 Heart failure, unspecified: Secondary | ICD-10-CM

## 2014-05-06 DIAGNOSIS — I5023 Acute on chronic systolic (congestive) heart failure: Secondary | ICD-10-CM

## 2014-05-06 DIAGNOSIS — E119 Type 2 diabetes mellitus without complications: Secondary | ICD-10-CM

## 2014-05-06 DIAGNOSIS — I482 Chronic atrial fibrillation: Secondary | ICD-10-CM

## 2014-05-06 DIAGNOSIS — J9601 Acute respiratory failure with hypoxia: Secondary | ICD-10-CM

## 2014-05-06 LAB — BASIC METABOLIC PANEL
Anion gap: 17 — ABNORMAL HIGH (ref 5–15)
BUN: 49 mg/dL — ABNORMAL HIGH (ref 6–23)
CALCIUM: 8.6 mg/dL (ref 8.4–10.5)
CHLORIDE: 97 mmol/L (ref 96–112)
CO2: 24 mmol/L (ref 19–32)
Creatinine, Ser: 1.94 mg/dL — ABNORMAL HIGH (ref 0.50–1.35)
GFR calc Af Amer: 38 mL/min — ABNORMAL LOW (ref >90)
GFR calc non Af Amer: 33 mL/min — ABNORMAL LOW (ref >90)
GLUCOSE: 156 mg/dL — AB (ref 70–99)
Potassium: 2.5 mmol/L — CL (ref 3.5–5.1)
SODIUM: 138 mmol/L (ref 135–145)

## 2014-05-06 LAB — GLUCOSE, CAPILLARY
GLUCOSE-CAPILLARY: 172 mg/dL — AB (ref 70–99)
GLUCOSE-CAPILLARY: 163 mg/dL — AB (ref 70–99)
Glucose-Capillary: 182 mg/dL — ABNORMAL HIGH (ref 70–99)
Glucose-Capillary: 100 mg/dL — ABNORMAL HIGH (ref 70–99)

## 2014-05-06 LAB — PROTIME-INR
INR: 1.69 — ABNORMAL HIGH (ref 0.00–1.49)
Prothrombin Time: 20 seconds — ABNORMAL HIGH (ref 11.6–15.2)

## 2014-05-06 LAB — CBC
HCT: 40.5 % (ref 39.0–52.0)
HEMOGLOBIN: 13.8 g/dL (ref 13.0–17.0)
MCH: 31 pg (ref 26.0–34.0)
MCHC: 34.1 g/dL (ref 30.0–36.0)
MCV: 91 fL (ref 78.0–100.0)
Platelets: 197 10*3/uL (ref 150–400)
RBC: 4.45 MIL/uL (ref 4.22–5.81)
RDW: 14.6 % (ref 11.5–15.5)
WBC: 9.1 10*3/uL (ref 4.0–10.5)

## 2014-05-06 LAB — HEMOGLOBIN A1C
Hgb A1c MFr Bld: 6.4 % — ABNORMAL HIGH (ref 4.8–5.6)
MEAN PLASMA GLUCOSE: 137 mg/dL

## 2014-05-06 LAB — HEPARIN LEVEL (UNFRACTIONATED)
Heparin Unfractionated: <0.1 IU/mL — ABNORMAL LOW (ref 0.30–0.70)
Heparin Unfractionated: 0.1 IU/mL — ABNORMAL LOW (ref 0.30–0.70)
Heparin Unfractionated: 0.16 IU/mL — ABNORMAL LOW (ref 0.30–0.70)

## 2014-05-06 LAB — EXPECTORATED SPUTUM ASSESSMENT W REFEX TO RESP CULTURE

## 2014-05-06 LAB — TROPONIN I
Troponin I: 0.14 ng/mL — ABNORMAL HIGH (ref <0.031)
Troponin I: 0.12 ng/mL — ABNORMAL HIGH (ref <0.031)
Troponin I: 0.12 ng/mL — ABNORMAL HIGH (ref <0.031)

## 2014-05-06 LAB — EXPECTORATED SPUTUM ASSESSMENT W GRAM STAIN, RFLX TO RESP C

## 2014-05-06 MED ORDER — HYDROCOD POLST-CHLORPHEN POLST 10-8 MG/5ML PO LQCR
5.0000 mL | Freq: Two times a day (BID) | ORAL | Status: DC | PRN
  Administered 2014-05-06 – 2014-05-07 (×3): 5 mL via ORAL
  Filled 2014-05-06 (×3): qty 5

## 2014-05-06 MED ORDER — ALBUTEROL SULFATE (2.5 MG/3ML) 0.083% IN NEBU
2.5000 mg | INHALATION_SOLUTION | RESPIRATORY_TRACT | Status: DC | PRN
  Administered 2014-05-08: 2.5 mg via RESPIRATORY_TRACT
  Filled 2014-05-06: qty 3

## 2014-05-06 MED ORDER — IPRATROPIUM-ALBUTEROL 0.5-2.5 (3) MG/3ML IN SOLN
3.0000 mL | Freq: Three times a day (TID) | RESPIRATORY_TRACT | Status: DC
  Administered 2014-05-06 – 2014-05-07 (×4): 3 mL via RESPIRATORY_TRACT
  Filled 2014-05-06 (×4): qty 3

## 2014-05-06 MED ORDER — GUAIFENESIN ER 600 MG PO TB12
1200.0000 mg | ORAL_TABLET | Freq: Two times a day (BID) | ORAL | Status: DC
  Administered 2014-05-06 – 2014-05-10 (×8): 1200 mg via ORAL
  Filled 2014-05-06 (×9): qty 2

## 2014-05-06 MED ORDER — WARFARIN SODIUM 5 MG PO TABS
5.0000 mg | ORAL_TABLET | Freq: Once | ORAL | Status: AC
  Administered 2014-05-06: 5 mg via ORAL
  Filled 2014-05-06: qty 1

## 2014-05-06 MED ORDER — POTASSIUM CHLORIDE CRYS ER 20 MEQ PO TBCR
30.0000 meq | EXTENDED_RELEASE_TABLET | ORAL | Status: AC
  Administered 2014-05-06 (×2): 30 meq via ORAL
  Filled 2014-05-06 (×2): qty 1

## 2014-05-06 NOTE — Progress Notes (Addendum)
[facility]                                                                              Patient Demographics  [name], is a 74 y.o. male, DOB - 1940/10/06, KWI:[id number]  Admit date - 05/05/2014   Admitting Physician Louellen Molder, MD  Outpatient Primary MD for the patient is [name] Cobble, MD  LOS - 1   Chief Complaint  Patient presents with  . Cough      Admit HPI/Interim history 75 year old male with past history of COPD, congestive heart failure EF of 25-30%, valvular disease who presented with productive cough for 4 days.  Patient admitted for acute heart failure exacerbation along with possible COPD exacerbation.  Continue Z-Pak, nebulizers, diuresis, supplemental oxygen suspect can be discharged within the next 24-48 hours.  Assessment & Plan   Acute respiratory failure with hypoxia -Multifactorial causes including CHF exacerbation as well as possible COPD exacerbation -Chest x-ray showed rami without congestive failure, calcified right pleural plaque -Continue supplemental oxygen to maintain saturations above 90%, diuresis, Z-Pak, nebulizer treatments -Patient had elevated d-dimer, 1.6, VQ scan obtained which was negative for PE  Acute on chronic systolic heart failure exacerbation -Echocardiogram: November 2015 shows an EF of 25-30% -Continue monitor daily weights, intake and output; UOP 162ml in past 24hrs -Continue Lasix 40 mg IV twice a day  Elevated troponin -Elevation 0.20, currently 0.14 -Continue to cycle troponin -Likely secondary to demand ischemia -Cardiology consulted and appreciated -Continue aspirin, statin, Coreg  Possible COPD exacerbation -Continue Z-Pak, nebs -Sputum culture pending -Patient currently not on steroids as he is currently not wheezing   Acute renal failure on CKD Stage 3 -Baseline creatinine of 1.5-1.6 -Upon admission 2.11, currently improving 1.94 -Will continue to monitor closely as patient will be on  diuresis  Atrial fibrillation with subtherapeutic INR -Patient currently rate controlled -CHADSVASc 4 -Continue heparin and Coumadin per pharmacy -Continue Coreg  Relative hypotension -Resolved, will continue to monitor vitals closely -Amlodipine and Cozaar held  Acute urinary retention -Patient had bladder scan the emergency department showing 460 mL's of urine was retained -Foley catheter placed  Diabetes mellitus, type II -Hemoglobin A1c 6.4 -Continue Lantus, sliding scale CBG monitoring  Hypokalemia Likely secondary to diuresis, will replace and continue to monitor  Code Status: Full  Family Communication: None at bedside  Disposition Plan: Admitted, continue to diurese and monitor closely  Time Spent in minutes   30 minutes  Procedures  None  Consults  Cardiology  DVT Prophylaxis  Coumadin/Heparin  Lab Results  Component Value Date   PLT 197 05/06/2014    Medications  Scheduled Meds: . acetaminophen  650 mg Oral BH-q7a  . aspirin EC  81 mg Oral Daily  . atorvastatin  10 mg Oral Daily  . azithromycin  250 mg Oral Daily  . carvedilol  6.25 mg Oral BID  . furosemide  40 mg Intravenous Q12H  . insulin aspart  0-15 Units Subcutaneous TID WC  . insulin aspart  0-5 Units Subcutaneous QHS  . insulin detemir  30 Units Subcutaneous QHS  . ipratropium-albuterol  3 mL Nebulization Q6H  . polyvinyl alcohol  2 drop Both Eyes Daily  .  potassium chloride SA  40 mEq Oral BID  . sodium chloride  3 mL Intravenous Q12H  . Warfarin - Pharmacist Dosing Inpatient   Does not apply q1800   Continuous Infusions: . heparin 1,600 Units/hr (05/06/14 1006)   PRN Meds:.sodium chloride, acetaminophen, ALPRAZolam, chlorpheniramine-HYDROcodone, gabapentin, naphazoline-pheniramine, nitroGLYCERIN, ondansetron (ZOFRAN) IV, sodium chloride  Antibiotics    Anti-infectives    Start     Dose/Rate Route Frequency Ordered Stop   05/06/14 1000  azithromycin (ZITHROMAX) tablet 250 mg      250 mg Oral Daily 05/05/14 1819 05/10/14 0959   05/05/14 1830  azithromycin (ZITHROMAX) tablet 500 mg     500 mg Oral Daily 05/05/14 1819 05/05/14 1959      Subjective:   [name] seen and examined today.  Patient feels his breathing is improved since coming to the hospital. He denies any chest pain, abdominal pain, dizziness, headache at this time.    Objective:   Filed Vitals:   05/05/14 2030 05/05/14 2241 05/06/14 0452 05/06/14 0856  BP: 142/73 141/78 125/74   Pulse: 73 80 87   Temp: 100.6 F (38.1 C) 99.1 F (37.3 C) 97.8 F (36.6 C)   TempSrc: Oral Oral    Resp: 19  19   Height:      Weight:   109.7 kg (241 lb 13.5 oz)   SpO2: 96%  100% 93%    Wt Readings from Last 3 Encounters:  05/06/14 109.7 kg (241 lb 13.5 oz)  04/02/14 113.399 kg (250 lb)  03/31/14 114.306 kg (252 lb)     Intake/Output Summary (Last 24 hours) at 05/06/14 1207 Last data filed at 05/06/14 1000  Gross per 24 hour  Intake    664 ml  Output   1600 ml  Net   -936 ml    Exam  General: Well developed, well nourished, NAD, appears stated age  30: NCAT,  mucous membranes moist.   Cardiovascular: S1 S2 auscultated, Irregular  Respiratory: Coarse breath sounds, no wheezing  Abdomen: Soft, nontender, nondistended, + bowel sounds  Extremities: warm dry without cyanosis clubbing or edema  Neuro: AAOx3, nonfocal  Psych: Normal affect and demeanor with intact judgement and insight  Data Review   Micro Results No results found for this or any previous visit (from the past 240 hour(s)).  Radiology Reports Dg Chest 2 View  05/05/2014   CLINICAL DATA:  Cough, congestion 4 days.  No fever.  No chest pain.  EXAM: CHEST  2 VIEW  COMPARISON:  01/01/2014  FINDINGS: Mild bilateral interstitial thickening. Right basilar calcified pleural plaque. There is no focal parenchymal opacity, pleural effusion, or pneumothorax. There is stable cardiomegaly. There is evidence of prior CABG. There is  a 3-lead cardiac pacer.  The osseous structures are unremarkable.  IMPRESSION: Cardiomegaly without congestive failure.  Calcified right pleural plaque as can be seen with asbestos exposure.   Electronically Signed   By: Kathreen Devoid   On: 05/05/2014 11:00   Nm Pulmonary Perf And Vent  05/05/2014   CLINICAL DATA:  Shortness of breath.  COPD.  EXAM: NUCLEAR MEDICINE VENTILATION - PERFUSION LUNG SCAN  TECHNIQUE: Ventilation images were obtained in multiple projections using inhaled aerosol technetium 99 M DTPA. Perfusion images were obtained in multiple projections after intravenous injection of Tc-30m MAA.  RADIOPHARMACEUTICALS:  40 mCi Tc-73m DTPA aerosol and 6 mCi Tc-45m MAA  COMPARISON:  CT chest 02/13/2010. PA and lateral views tear 2 there is a PA and lateral chest this same  date.  FINDINGS: Ventilation: Ventilation to the left lung appears slightly decreased compatible with volume loss scar as seen on the prior exams.  Perfusion: No wedge shaped peripheral perfusion defects to suggest acute pulmonary embolism.  IMPRESSION: Negative for pulmonary embolus.   Electronically Signed   By: Inge Rise M.D.   On: 05/05/2014 22:40    CBC  Recent Labs Lab 05/05/14 1117 05/06/14 0246  WBC 9.5 9.1  HGB 14.3 13.8  HCT 41.8 40.5  PLT 176 197  MCV 92.7 91.0  MCH 31.7 31.0  MCHC 34.2 34.1  RDW 14.6 14.6  LYMPHSABS 0.7  --   MONOABS 1.0  --   EOSABS 0.0  --   BASOSABS 0.0  --     Chemistries   Recent Labs Lab 05/05/14 1117 05/06/14 0251  NA 138 138  K 3.1* 2.5*  CL 100 97  CO2 29 24  GLUCOSE 208* 156*  BUN 49* 49*  CREATININE 2.11* 1.94*  CALCIUM 8.7 8.6   ------------------------------------------------------------------------------------------------------------------ estimated creatinine clearance is 43.4 mL/min (by C-G formula based on Cr of 1.94). ------------------------------------------------------------------------------------------------------------------  Recent  Labs  05/05/14 1622  HGBA1C 6.4*   ------------------------------------------------------------------------------------------------------------------ No results for input(s): CHOL, HDL, LDLCALC, TRIG, CHOLHDL, LDLDIRECT in the last 72 hours. ------------------------------------------------------------------------------------------------------------------ No results for input(s): TSH, T4TOTAL, T3FREE, THYROIDAB in the last 72 hours.  Invalid input(s): FREET3 ------------------------------------------------------------------------------------------------------------------ No results for input(s): VITAMINB12, FOLATE, FERRITIN, TIBC, IRON, RETICCTPCT in the last 72 hours.  Coagulation profile  Recent Labs Lab 05/05/14 1117 05/06/14 0251  INR 1.60* 1.69*     Recent Labs  05/05/14 1413  DDIMER 1.62*    Cardiac Enzymes  Recent Labs Lab 05/05/14 1622 05/05/14 2122 05/06/14 0251  TROPONINI 0.09* 0.20* 0.14*   ------------------------------------------------------------------------------------------------------------------ Invalid input(s): POCBNP    Jackson Fetters D.O. on 05/06/2014 at 12:07 PM  Between 7am to 7pm - Pager - [phone]  After 7pm go to [email or web address] - password [email or web address]  And look for the night coverage person covering for me after hours  [facility] Office  [phone]

## 2014-05-06 NOTE — Clinical Documentation Improvement (Signed)
Current documentation states "Acute exacerbation of congestive heart failure".  Please provide the type of heart failure along with acuity in your progress notes and carry over to the discharge summary.  Document: CHF Acuity: -Acute on chronic -Acute only -Chronic only  CHF Type: -Combined systolic and diastolic  -Systolic only -Diastolic only  Due to or associated with: (if known) --Cardiac or other surgery --Hypertension --Valvular disease --Rheumatic heart disease --Unable to determine at present  Thank you, Mateo Flow, RN [phone] Clinical Documentation Specialist . Document acuity --Acute --Chronic --Acute on Chronic . Document type --Diastolic --Systolic --Combined systolic and diastolic . Due to or associated with --Cardiac or other surgery --Hypertension --Valvular disease --Rheumatic heart disease Endocarditis (valvitis) Pericarditis Myocarditis --Other (specify)  Thank you, Mateo Flow, RN [phone] Clinical Documentation Specialist

## 2014-05-06 NOTE — Progress Notes (Signed)
Utilization Review Completed.Deeya Richeson T2/16/2016  

## 2014-05-06 NOTE — Progress Notes (Signed)
ANTICOAGULATION CONSULT NOTE - Follow Up Consult  Pharmacy Consult for heparin Indication: atrial fibrillation  Labs:  Recent Labs  05/05/14 1117 05/05/14 1622 05/05/14 2122 05/06/14 0246 05/06/14 0251  HGB 14.3  --   --  13.8  --   HCT 41.8  --   --  40.5  --   PLT 176  --   --  197  --   LABPROT 19.2*  --   --   --  20.0*  INR 1.60*  --   --   --  1.69*  HEPARINUNFRC  --   --   --  <0.10*  --   CREATININE 2.11*  --   --   --   --   TROPONINI  --  0.09* 0.20*  --   --       Assessment: 74yo male undetectable on heparin with initial dosing for low INR; noted that VQ negative for PE.  Goal of Therapy:  Heparin level 0.3-0.7 units/ml   Plan:  Will increase heparin gtt by 4 units/kg/hr to 1600 units/hr and check level in [place], PharmD, BCPS  05/06/2014,3:51 AM

## 2014-05-06 NOTE — Progress Notes (Signed)
[name] Najjar, NP paged for elevated troponin level of 0.20.  Pt currently chest pain free and resting comfortably.  No new orders at this time.  Will continue to monitor patient closely.  Claudette Stapler, RN

## 2014-05-06 NOTE — Discharge Instructions (Signed)

## 2014-05-06 NOTE — Progress Notes (Signed)
CRITICAL VALUE ALERT  Critical value received:  K+ - 2.5  Date of notification:  05/06/14   Time of notification:  4:00am  Critical value read back: yes  Nurse who received alert:  Doretha Sou RN  MD notified (1st page):  Baltazar Najjar PA   Time of first page:  4:10  Responding MD:  Baltazar Najjar  Time MD responded:  4:20

## 2014-05-06 NOTE — Consult Note (Signed)
CONSULT NOTE  Date: 05/06/2014               Patient Name:  [name] MRN: [id number]  DOB: December 09, 1940 Age / Sex: 74 y.o., male        PCP: [name] Primary Cardiologist: [name]            Referring Physician: Ree Kida              Reason for Consult: Dyspnea, + troponin           History of Present Illness: Patient is a 74 y.o. male with a PMHx of diabetes mellitus, hypertension, hyperlipidemia, atrial fibrillation/atrial flutter, chronic systolic congestive heart failure,  coronary artery disease:, ICD implantation, who was admitted to [facility] on 05/05/2014 for evaluation of worsening shortness of breath. She was noted to have mildly elevated troponin levels. We will consulted for further evaluation.  He's had increased leg swelling for the past several days. He is coughing up sputum that is yellow tinged. He's had a respiratory illness.  Weakness for past 5 days.  URI symptoms. Wife called the EMS yesterday because he could not stand .   Profound weakness,  Weak feet, legs, no dizziness.   No CP  . Has been eating and drinking regularly.  Better today   Hx of Mi ~ 2005.  No symptoms similar to that presentation.  He's had progressive leg swelling     Medications: Outpatient medications: Prescriptions prior to admission  Medication Sig Dispense Refill Last Dose  . ACCU-CHEK FASTCLIX LANCETS MISC USE ONE LANCET TO CHECK BLOOD SUGAR THREE TIMES DAILY 102 each 5 05/04/2014 at Unknown time  . acetaminophen (TYLENOL) 325 MG tablet Take 975 mg by mouth every morning.   05/05/2014 at Unknown time  . allopurinol (ZYLOPRIM) 100 MG tablet Take 1 tablet (100 mg total) by mouth daily. 90 tablet 3 05/04/2014 at Unknown time  . amLODipine (NORVASC) 5 MG tablet Take 2.5 mg by mouth daily.   05/05/2014 at Unknown time  . aspirin 81 MG tablet Take 81 mg by mouth once a week.    05/04/2014 at Unknown time  . atorvastatin (LIPITOR) 10 MG tablet Take 1 tablet (10 mg total) by mouth  daily. 90 tablet 3 05/04/2014 at Unknown time  . carboxymethylcellulose (REFRESH PLUS) 0.5 % SOLN 2 drops daily.   05/05/2014 at Unknown time  . carvedilol (COREG) 6.25 MG tablet Take 1 tablet (6.25 mg total) by mouth 2 (two) times daily. 180 tablet 1 05/05/2014 at 8:30 am  . chlorthalidone (HYGROTON) 25 MG tablet Take 12.5 mg by mouth daily. Take when weight reaches 243 lbs. or greater only 90 tablet 3 05/04/2014 at Unknown time  . Cholecalciferol 10000 UNITS CAPS Take 1,000 Units by mouth daily.   05/05/2014 at Unknown time  . colchicine (COLCRYS) 0.6 MG tablet Take 1 tablet (0.6 mg total) by mouth as needed (for gout). 90 tablet 0 05/04/2014 at Unknown time  . furosemide (LASIX) 40 MG tablet Take 2 tablets (80 mg total) by mouth 2 (two) times daily. 360 tablet 1 05/05/2014 at Unknown time  . gabapentin (NEURONTIN) 300 MG capsule 3 x daily as needed for leg pain 270 capsule 3 05/05/2014 at Unknown time  . insulin lispro (HUMALOG KWIKPEN) 100 UNIT/ML KiwkPen Inject 0.12-0.14 mLs (12-14 Units total) into the skin 2 (two) times daily. 5 pen 3 05/04/2014 at Unknown time  . ipratropium (ATROVENT) 0.06 % nasal spray Place 1 spray into  both nostrils as directed.    05/05/2014 at Unknown time  . LEVEMIR FLEXTOUCH 100 UNIT/ML Pen INJECT 35 UNITS INTO THE SKIN EVERY NIGHT AT BEDTIME 15 mL 5 05/04/2014 at Unknown time  . losartan (COZAAR) 50 MG tablet Take 1 tablet (50 mg total) by mouth daily. 30 tablet 3   . metFORMIN (GLUCOPHAGE-XR) 750 MG 24 hr tablet TAKE ONE TABLET BY MOUTH ONCE DAILY WITH BREAKFAST 30 tablet 5 05/05/2014 at Unknown time  . naphazoline-pheniramine (NAPHCON-A) 0.025-0.3 % ophthalmic solution Place 1 drop into both eyes daily as needed for irritation.   Past Month at Unknown time  . OVER THE COUNTER MEDICATION Take by mouth as needed (Cough).   05/04/2014 at Unknown time  . Potassium Chloride ER 20 MEQ TBCR Take 20 mcg by mouth 2 (two) times daily. TAKES 1 1/2 TAB = 30 MEQ.  TWICE A DAY   TOTAL IS 60  MEQ DAILY   05/05/2014 at Unknown time  . warfarin (COUMADIN) 2.5 MG tablet TAKE AS DIRECTED BY  ANTICOAGULATION  CLINIC (Patient taking differently: Take 2.5 mg daily except Wednesday, take 5 mg.) 130 tablet 0 05/04/2014 at Unknown time  . ACCU-CHEK SMARTVIEW test strip    Taking  . nitroGLYCERIN (NITROSTAT) 0.4 MG SL tablet Place 0.4 mg under the tongue every 5 (five) minutes as needed for chest pain (MAX 3 TABLETS).    Taking    Current medications: Current Facility-Administered Medications  Medication Dose Route Frequency Provider Last Rate Last Dose  . 0.9 %  sodium chloride infusion  250 mL Intravenous PRN Melton Alar, PA-C      . acetaminophen (TYLENOL) tablet 650 mg  650 mg Oral BH-q7a Marianne L York, PA-C   650 mg at 05/06/14 0656  . acetaminophen (TYLENOL) tablet 650 mg  650 mg Oral Q4H PRN Melton Alar, PA-C      . albuterol (PROVENTIL) (2.5 MG/3ML) 0.083% nebulizer solution 2.5 mg  2.5 mg Nebulization Q4H PRN Maryann Mikhail, DO      . ALPRAZolam Duanne Moron) tablet 0.25 mg  0.25 mg Oral BID PRN Melton Alar, PA-C      . aspirin EC tablet 81 mg  81 mg Oral Daily Melton Alar, PA-C   81 mg at 05/06/14 1007  . atorvastatin (LIPITOR) tablet 10 mg  10 mg Oral Daily Melton Alar, PA-C   10 mg at 05/06/14 1007  . azithromycin (ZITHROMAX) tablet 250 mg  250 mg Oral Daily Nishant Dhungel, MD   250 mg at 05/06/14 1007  . carvedilol (COREG) tablet 6.25 mg  6.25 mg Oral BID Melton Alar, PA-C   6.25 mg at 05/06/14 1007  . chlorpheniramine-HYDROcodone (TUSSIONEX) 10-8 MG/5ML suspension 5 mL  5 mL Oral QHS PRN Melton Alar, PA-C      . furosemide (LASIX) injection 40 mg  40 mg Intravenous Q12H Melton Alar, PA-C   40 mg at 05/05/14 2253  . gabapentin (NEURONTIN) capsule 300 mg  300 mg Oral TID PRN Melton Alar, PA-C      . heparin ADULT infusion 100 units/mL (25000 units/250 mL)  1,600 Units/hr Intravenous Continuous Rogue Bussing, RPH 16 mL/hr at 05/06/14 1006 1,600  Units/hr at 05/06/14 1006  . insulin aspart (novoLOG) injection 0-15 Units  0-15 Units Subcutaneous TID WC Melton Alar, PA-C   3 Units at 05/06/14 [phone]  . insulin aspart (novoLOG) injection 0-5 Units  0-5 Units Subcutaneous QHS Melton Alar, PA-C  0 Units at 05/05/14 2200  . insulin detemir (LEVEMIR) injection 30 Units  30 Units Subcutaneous QHS Melton Alar, PA-C   30 Units at 05/05/14 2200  . ipratropium-albuterol (DUONEB) 0.5-2.5 (3) MG/3ML nebulizer solution 3 mL  3 mL Nebulization TID Maryann Mikhail, DO      . naphazoline-pheniramine (NAPHCON-A) 0.025-0.3 % ophthalmic solution 1 drop  1 drop Both Eyes Daily PRN Melton Alar, PA-C      . nitroGLYCERIN (NITROSTAT) SL tablet 0.4 mg  0.4 mg Sublingual Q5 min PRN Melton Alar, PA-C      . ondansetron [facility]) injection 4 mg  4 mg Intravenous Q6H PRN Melton Alar, PA-C      . polyvinyl alcohol (LIQUIFILM TEARS) 1.4 % ophthalmic solution 2 drop  2 drop Both Eyes Daily Melton Alar, PA-C   2 drop at 05/06/14 1000  . potassium chloride SA (K-DUR,KLOR-CON) CR tablet 40 mEq  40 mEq Oral BID Melton Alar, PA-C   40 mEq at 05/06/14 1007  . sodium chloride 0.9 % injection 3 mL  3 mL Intravenous Q12H Melton Alar, PA-C   Stopped at 05/05/14 1612  . sodium chloride 0.9 % injection 3 mL  3 mL Intravenous PRN Melton Alar, PA-C      . Warfarin - Pharmacist Dosing Inpatient   Does not apply [place], [facility]         Allergies  Allergen Reactions  . Avelox [Moxifloxacin Hcl In Nacl] Swelling, Rash and Other (See Comments)    Patient became hypotensive after infusion started Because of a history of documented adverse serious drug reaction;Medi Alert bracelet  is recommended  . Penicillins Anaphylaxis    REACTION: anaphylaxis Because of a history of documented adverse serious drug reaction;Medi Alert bracelet  is recommended     Past Medical History  Diagnosis Date  . Diabetes mellitus   . Hyperlipidemia   .  Hypertension   . Pilonidal cyst   . Atrial fibrillation     Previous long-term amiodarone therapy with multiple cardioversions / amiodarone stopped September, 2009  . Atrial flutter     Started November, 2010, Left-sided and cannot ablate  . Left atrial thrombus     Remote past... cardioversions done since that time  . Wide-complex tachycardia   . Left ventricular ejection fraction less than 40%   . Gout   . AAA (abdominal aortic aneurysm)     Surgical repair  . Discolored skin   . S/P ICD (internal cardiac defibrillator) procedure     Dr. Lovena Le 2009... by the pacing  . SOB (shortness of breath)     Large left effusion/ thoracentesis/hospitalization/November, 2011... exudated.. cytology negative.. Dr.Wert.. no proof of mesothelioma  . Pericardial effusion   . Pleural effusion     Large loculated effusion on the left side November, 2011. This was tapped. It was exudative. Cytology revealed no cancer no proof of mesothelioma area pulmonary team felt that no further workup was needed  . S/P AAA repair   . Spinal stenosis     Surgery Dr.Elsner  . CAD (coronary artery disease)     Catheterization July, 2008... name and vein grafts patent but low cardiac output  . Warfarin anticoagulation   . Cardiomyopathy     Ischemic... ICD  . Combined systolic and diastolic CHF   . Venous insufficiency     Toe discoloration chronic  . Mitral regurgitation     Mild echo  . Aortic valve  sclerosis   . Nasal drainage     Chronic  . Alcohol ingestion of more than four drinks per week     Excess beer  not a dependency problem  . Chronotropic incompetence     IV pacing rate adjusted  . Thrombophlebitis of superficial veins of upper extremities     Possible venous stenosis from defibrillator  . Pericardial effusion     November, 2011 .. decreased during hospitalization  . Eye abnormality     Ophthalmologist questions a clot in one of his eyes, May, 2012  . Overweight(278.02)     November, 2012    . Pleural thickening   . Ejection fraction     Ejection fraction has varied over time from 35-50%.,, Echoes are technically very difficult,,, EF 50%, echo, May 25, 2011, technically very difficult  . Drug therapy     Redness and swelling with Avelox infusion May 24, 2011  . COPD (chronic obstructive pulmonary disease)   . Myocardial infarction   . Spinal cord stimulator status     October, 2013  . Carotid artery disease     Doppler, December, 2013, 0-39% bilateral  . Pneumonia   . Arthritis   . Ventral hernia     April, 2014, result of his abdominal surgery  . Bony abnormality     Patient's manubrium is slightly displaced to the right    Past Surgical History  Procedure Laterality Date  . Colonoscopy w/ polypectomy    . Lumbar fusion    . Pilonidal cyst removal    . Surgery scrotal / testicular    . Coronary artery bypass graft  2004  . Abdominal aortic aneurysm repair  11/2002  . Bi-ventricular pacemaker insertion (crt-p)  02-11-2013    Pt with previously implanted MDT CRTD downgraded to CRTP by Dr Lovena Le 02-11-13  . Back surgery    . Incision and drainage abscess / hematoma of bursa / knee / thigh    . Implantable cardioverter defibrillator (icd) generator change N/A 02/11/2013    Procedure: ICD GENERATOR CHANGE;  Surgeon: Evans Lance, MD;  Location: [facility] CATH LAB;  Service: Cardiovascular;  Laterality: N/A;    Family History  Problem Relation Age of Onset  . Hypertension Mother   . Stroke Mother   . Diabetes Father   . Coronary artery disease Father   . Other Father     DVT    Social History:  reports that he quit smoking about 19 years ago. His smoking use included Cigarettes. He started smoking about 57 years ago. He has a 117 pack-year smoking history. He has never used smokeless tobacco. He reports that he drinks about 1.2 oz of alcohol per week. He reports that he does not use illicit drugs.   Review of Systems: Constitutional:  denies fever, chills,  diaphoresis, appetite change and fatigue.  HEENT: denies photophobia, eye pain, redness, hearing loss, ear pain, congestion, sore throat, rhinorrhea, sneezing, neck pain, neck stiffness and tinnitus.  Respiratory: admits to SOB, DOE, cough, chest tightness, and wheezing.,  Cough - yellow sputuem   Cardiovascular: denies chest pain, palpitations  But has had increasign leg swelling.  Gastrointestinal: denies nausea, vomiting, abdominal pain, diarrhea, constipation, blood in stool.  Genitourinary: denies dysuria, urgency, frequency, hematuria, flank pain and difficulty urinating.  Musculoskeletal: denies  myalgias, back pain, joint swelling, arthralgias and gait problem.   Skin: denies pallor, rash and wound.  Neurological: denies dizziness, seizures, syncope, weakness, light-headedness, numbness and headaches.  Hematological: denies adenopathy, easy bruising, personal or family bleeding history.  Psychiatric/ Behavioral: denies suicidal ideation, mood changes, confusion, nervousness, sleep disturbance and agitation.    Physical Exam: BP 125/74 mmHg  Pulse 87  Temp(Src) 97.8 F (36.6 C) (Oral)  Resp 19  Ht 6' (1.829 m)  Wt 241 lb 13.5 oz (109.7 kg)  BMI 32.79 kg/m2  SpO2 93%  Wt Readings from Last 3 Encounters:  05/06/14 241 lb 13.5 oz (109.7 kg)  04/02/14 250 lb (113.399 kg)  03/31/14 252 lb (114.306 kg)    General: Vital signs reviewed and noted. Well-developed, well-nourished, in no acute distress; alert,   Head: Normocephalic, atraumatic, sclera anicteric,   Neck: Supple. Negative for carotid bruits. No JVD   Lungs:  Wheezing and rhonchi bilaterally   Heart: Irreg. Irreg. , soft systolic murmur, no leg edema today   Abdomen/ GI :  Soft, non-tender, non-distended with normoactive bowel sounds. No hepatomegaly. No rebound/guarding. No obvious abdominal masses   MSK: Strength and the appear normal for age.   Extremities: No clubbing or cyanosis. No edema.  Distal pedal pulses are  2+ and equal   Neurologic:  CN are grossly intact,  No obvious motor or sensory defect.  Alert and oriented X 3. Moves all extremities spontaneously.  Psych: Responds to questions appropriately with a normal affect.     Lab results: Basic Metabolic Panel:  Recent Labs Lab 05/05/14 1117 05/06/14 0251  NA 138 138  K 3.1* 2.5*  CL 100 97  CO2 29 24  GLUCOSE 208* 156*  BUN 49* 49*  CREATININE 2.11* 1.94*  CALCIUM 8.7 8.6    Liver Function Tests: No results for input(s): AST, ALT, ALKPHOS, BILITOT, PROT, ALBUMIN in the last 168 hours. No results for input(s): LIPASE, AMYLASE in the last 168 hours. No results for input(s): AMMONIA in the last 168 hours.  CBC:  Recent Labs Lab 05/05/14 1117 05/06/14 0246  WBC 9.5 9.1  NEUTROABS 7.7  --   HGB 14.3 13.8  HCT 41.8 40.5  MCV 92.7 91.0  PLT 176 197    Cardiac Enzymes:  Recent Labs Lab 05/05/14 1622 05/05/14 2122 05/06/14 0251  TROPONINI 0.09* 0.20* 0.14*    BNP: Invalid input(s): POCBNP  CBG:  Recent Labs Lab 05/05/14 1313 05/05/14 1638 05/05/14 2118 05/06/14 0639 05/06/14 1118  GLUCAP 180* 174* 115* 172* 182*    Coagulation Studies:  Recent Labs  05/05/14 1117 05/06/14 0251  LABPROT 19.2* 20.0*  INR 1.60* 1.69*     Other results:  Personal review of EKG shows :   Atrial fibrillation with a controlled ventricular response. He has a left bundle-branch block.   Imaging: Dg Chest 2 View  05/05/2014   CLINICAL DATA:  Cough, congestion 4 days.  No fever.  No chest pain.  EXAM: CHEST  2 VIEW  COMPARISON:  01/01/2014  FINDINGS: Mild bilateral interstitial thickening. Right basilar calcified pleural plaque. There is no focal parenchymal opacity, pleural effusion, or pneumothorax. There is stable cardiomegaly. There is evidence of prior CABG. There is a 3-lead cardiac pacer.  The osseous structures are unremarkable.  IMPRESSION: Cardiomegaly without congestive failure.  Calcified right pleural plaque as  can be seen with asbestos exposure.   Electronically Signed   By: Kathreen Devoid   On: 05/05/2014 11:00   Nm Pulmonary Perf And Vent  05/05/2014   CLINICAL DATA:  Shortness of breath.  COPD.  EXAM: NUCLEAR MEDICINE VENTILATION - PERFUSION LUNG SCAN  TECHNIQUE: Ventilation images  were obtained in multiple projections using inhaled aerosol technetium 99 M DTPA. Perfusion images were obtained in multiple projections after intravenous injection of Tc-64m MAA.  RADIOPHARMACEUTICALS:  40 mCi Tc-37m DTPA aerosol and 6 mCi Tc-52m MAA  COMPARISON:  CT chest 02/13/2010. PA and lateral views tear 2 there is a PA and lateral chest this same date.  FINDINGS: Ventilation: Ventilation to the left lung appears slightly decreased compatible with volume loss scar as seen on the prior exams.  Perfusion: No wedge shaped peripheral perfusion defects to suggest acute pulmonary embolism.  IMPRESSION: Negative for pulmonary embolus.   Electronically Signed   By: Inge Rise M.D.   On: 05/05/2014 22:40          Assessment & Plan:  1. Acute on chronic systolic CHF:  He has known chronic systolic CHF.  It appear that he has developed a  viral  Respiratory illness that is caused an exacerbation of his congestive heart failure. He has a very deep and congested cough that sounds more like a respiratory infection and not CHF exacerbation. He is coughing up yellow phlegm. His wife was ill with a similar syndrome earlier this week.  We will repeat his echocardiogram. His initial troponin level is minimally elevated. We'll continue to cycle his troponin levels. At this point I do not think that there is any indication that this represents an acute coronary syndrome.  2.   Severe muscle weakness: The patient's main reason for calling EMS was that he was severely weak. It should be noted that his potassium is 2.5. This with generalized weakness may be related to hypokalemia. I  Doubt that his symptoms are due to congestive heart  failure.  3. Coronary artery disease: His troponin level 0.14. I suspect this is due to his congestive heart failure. There is no indication that this is due to an acute coronary syndrome. His symptoms are not at all similar to his presenting episodes when he had a myocardial infarction in the past.  4.  Upper respiratory tract infection: The patient has had a cold for the past several days. He now has a very deep cough productive of yellow-green sputum. I suspect that he has a viral illness. He's not febrile. I doubt that this is a bacterial pneumonia.  5. Chronic atrial fib:  On coumadin.  INR ws subtheraputic.  On heparin.    Following   Thayer Headings, Brooke Bonito., MD, [facility] 05/06/2014, 12:09 PM Office - [phone] Pager [phone]

## 2014-05-06 NOTE — Progress Notes (Addendum)
ANTICOAGULATION CONSULT NOTE - Follow Up Consult  Pharmacy Consult for heparin Indication: atrial fibrillation  Allergies  Allergen Reactions  . Avelox [Moxifloxacin Hcl In Nacl] Swelling, Rash and Other (See Comments)    Patient became hypotensive after infusion started Because of a history of documented adverse serious drug reaction;Medi Alert bracelet  is recommended  . Penicillins Anaphylaxis    REACTION: anaphylaxis Because of a history of documented adverse serious drug reaction;Medi Alert bracelet  is recommended    Patient Measurements: Height: 6' (182.9 cm) Weight: 241 lb 13.5 oz (109.7 kg) IBW/kg (Calculated) : 77.6 Heparin Dosing Weight: 101 kg  Vital Signs: Temp: 97.8 F (36.6 C) (02/16 0452) BP: 125/74 mmHg (02/16 0452) Pulse Rate: 87 (02/16 0452)  Labs:  Recent Labs  05/05/14 1117 05/05/14 1622 05/05/14 2122 05/06/14 0246 05/06/14 0251 05/06/14 1145  HGB 14.3  --   --  13.8  --   --   HCT 41.8  --   --  40.5  --   --   PLT 176  --   --  197  --   --   LABPROT 19.2*  --   --   --  20.0*  --   INR 1.60*  --   --   --  1.69*  --   HEPARINUNFRC  --   --   --  <0.10*  --  0.10*  CREATININE 2.11*  --   --   --  1.94*  --   TROPONINI  --  0.09* 0.20*  --  0.14*  --     Estimated Creatinine Clearance: 43.4 mL/min (by C-G formula based on Cr of 1.94).  Assessment: Patient is a 74 y.o M on coumadin PTA for hx afib.  D-dimer was elevated on admission with VQ scan neg for PE. He's currently being bridged with heparin d/t sub-therapeutic INR.  Heparin level is below goal at 0.10 with rate of 1600 units/hr.  Per RN, no bleeding noted or issues with IV line.  Goal of Therapy:  INR 2-3 Heparin level 0.3-0.7 units/ml Monitor platelets by anticoagulation protocol: Yes   Plan:  - increase heparin drip to 1800 units/hr - check 6 hour heparin level - coumadin 5mg  PO x1 today  Pham, Anh P 05/06/2014,1:15 PM  Addum:  Heparin level 0.16 units/ml.  No issues noted  with infusion per RN.  Increase heparin drip to 2000 units/hr.  F/u am labs.  Excell Seltzer, PharmD

## 2014-05-07 ENCOUNTER — Ambulatory Visit: Payer: Medicare Other | Admitting: Hematology & Oncology

## 2014-05-07 ENCOUNTER — Other Ambulatory Visit: Payer: Medicare Other | Admitting: Lab

## 2014-05-07 DIAGNOSIS — I481 Persistent atrial fibrillation: Secondary | ICD-10-CM

## 2014-05-07 DIAGNOSIS — N179 Acute kidney failure, unspecified: Secondary | ICD-10-CM

## 2014-05-07 DIAGNOSIS — R0902 Hypoxemia: Secondary | ICD-10-CM

## 2014-05-07 LAB — BASIC METABOLIC PANEL
Anion gap: 13 (ref 5–15)
BUN: 49 mg/dL — AB (ref 6–23)
CALCIUM: 8.6 mg/dL (ref 8.4–10.5)
CO2: 28 mmol/L (ref 19–32)
CREATININE: 1.93 mg/dL — AB (ref 0.50–1.35)
Chloride: 96 mmol/L (ref 96–112)
GFR, EST AFRICAN AMERICAN: 38 mL/min — AB (ref >90)
GFR, EST NON AFRICAN AMERICAN: 33 mL/min — AB (ref >90)
GLUCOSE: 115 mg/dL — AB (ref 70–99)
Potassium: 3.3 mmol/L — ABNORMAL LOW (ref 3.5–5.1)
Sodium: 137 mmol/L (ref 135–145)

## 2014-05-07 LAB — HEPARIN LEVEL (UNFRACTIONATED)
HEPARIN UNFRACTIONATED: 0.27 [IU]/mL — AB (ref 0.30–0.70)
Heparin Unfractionated: 0.25 IU/mL — ABNORMAL LOW (ref 0.30–0.70)
Heparin Unfractionated: 0.44 IU/mL (ref 0.30–0.70)

## 2014-05-07 LAB — GLUCOSE, CAPILLARY
GLUCOSE-CAPILLARY: 89 mg/dL (ref 70–99)
Glucose-Capillary: 81 mg/dL (ref 70–99)
Glucose-Capillary: 86 mg/dL (ref 70–99)
Glucose-Capillary: 98 mg/dL (ref 70–99)

## 2014-05-07 LAB — CBC
HEMATOCRIT: 42.5 % (ref 39.0–52.0)
HEMOGLOBIN: 14.4 g/dL (ref 13.0–17.0)
MCH: 31.1 pg (ref 26.0–34.0)
MCHC: 33.9 g/dL (ref 30.0–36.0)
MCV: 91.8 fL (ref 78.0–100.0)
Platelets: 217 10*3/uL (ref 150–400)
RBC: 4.63 MIL/uL (ref 4.22–5.81)
RDW: 14.6 % (ref 11.5–15.5)
WBC: 11.7 10*3/uL — ABNORMAL HIGH (ref 4.0–10.5)

## 2014-05-07 LAB — PROTIME-INR
INR: 1.72 — AB (ref 0.00–1.49)
Prothrombin Time: 20.3 seconds — ABNORMAL HIGH (ref 11.6–15.2)

## 2014-05-07 LAB — TROPONIN I: Troponin I: 0.11 ng/mL — ABNORMAL HIGH (ref <0.031)

## 2014-05-07 LAB — MAGNESIUM: Magnesium: 2.2 mg/dL (ref 1.5–2.5)

## 2014-05-07 MED ORDER — HEPARIN (PORCINE) IN NACL 100-0.45 UNIT/ML-% IJ SOLN
2200.0000 [IU]/h | INTRAMUSCULAR | Status: DC
  Administered 2014-05-08: 2200 [IU]/h via INTRAVENOUS
  Filled 2014-05-07 (×4): qty 250

## 2014-05-07 MED ORDER — WARFARIN SODIUM 7.5 MG PO TABS
7.5000 mg | ORAL_TABLET | Freq: Once | ORAL | Status: AC
  Administered 2014-05-07: 7.5 mg via ORAL
  Filled 2014-05-07 (×2): qty 1

## 2014-05-07 MED ORDER — POTASSIUM CHLORIDE CRYS ER 20 MEQ PO TBCR
40.0000 meq | EXTENDED_RELEASE_TABLET | ORAL | Status: AC
  Administered 2014-05-07 (×2): 40 meq via ORAL
  Filled 2014-05-07 (×3): qty 2

## 2014-05-07 NOTE — Progress Notes (Signed)
ANTICOAGULATION CONSULT NOTE - Follow Up Consult  Pharmacy Consult for heparin Indication: atrial fibrillation  Allergies  Allergen Reactions  . Avelox [Moxifloxacin Hcl In Nacl] Swelling, Rash and Other (See Comments)    Patient became hypotensive after infusion started Because of a history of documented adverse serious drug reaction;Medi Alert bracelet  is recommended  . Penicillins Anaphylaxis    REACTION: anaphylaxis Because of a history of documented adverse serious drug reaction;Medi Alert bracelet  is recommended    Patient Measurements: Height: 6' (182.9 cm) Weight: 243 lb 9.7 oz (110.5 kg) IBW/kg (Calculated) : 77.6 Heparin Dosing Weight: 101 kg  Vital Signs: Temp: 97.3 F (36.3 C) (02/17 1407) Temp Source: Oral (02/17 1407) BP: 125/76 mmHg (02/17 1407) Pulse Rate: 72 (02/17 1407)  Labs:  Recent Labs  05/05/14 1117  05/06/14 0246 05/06/14 0251  05/06/14 1302 05/06/14 1827  05/07/14 0008 05/07/14 0620 05/07/14 1442  HGB 14.3  --  13.8  --   --   --   --   --  14.4  --   --   HCT 41.8  --  40.5  --   --   --   --   --  42.5  --   --   PLT 176  --  197  --   --   --   --   --  217  --   --   LABPROT 19.2*  --   --  20.0*  --   --   --   --  20.3*  --   --   INR 1.60*  --   --  1.69*  --   --   --   --  1.72*  --   --   HEPARINUNFRC  --   --  <0.10*  --   < >  --   --   < > 0.25* 0.27* 0.44  CREATININE 2.11*  --   --  1.94*  --   --   --   --  1.93*  --   --   TROPONINI  --   < >  --  0.14*  --  0.12* 0.12*  --  0.11*  --   --   < > = values in this interval not displayed.  Estimated Creatinine Clearance: 43.8 mL/min (by C-G formula based on Cr of 1.93).   Assessment: Patient is a 74 y.o Mon heparin and coumadin for afib.  Heparin is therapeutic at 0.44 on 2200 units/hr.  No bleeding documented.   Goal of Therapy:  INR 2-3 Heparin level 0.3-0.7 units/ml Monitor platelets by anticoagulation protocol: Yes   Plan:  Continue heparin drip to 2200  units/hr Follow-up in AM level.   Sloan Leiter, PharmD, BCPS Clinical Pharmacist [phone] 05/07/2014,4:11 PM

## 2014-05-07 NOTE — Progress Notes (Signed)
Patient Name: [name] Date of Encounter: 05/07/2014  Principal Problem:   Acute respiratory failure with hypoxia Active Problems:   Atrial fibrillation   Hypoxia   Acute exacerbation of congestive heart failure   Acute renal failure   COPD exacerbation   Acute urinary retention   Diabetes   Length of Stay: 2  SUBJECTIVE  The patient states that he feels significantly better than yesterday.   CURRENT MEDS . acetaminophen  650 mg Oral BH-q7a  . aspirin EC  81 mg Oral Daily  . atorvastatin  10 mg Oral Daily  . azithromycin  250 mg Oral Daily  . carvedilol  6.25 mg Oral BID  . guaiFENesin  1,200 mg Oral BID  . insulin aspart  0-15 Units Subcutaneous TID WC  . insulin aspart  0-5 Units Subcutaneous QHS  . insulin detemir  30 Units Subcutaneous QHS  . ipratropium-albuterol  3 mL Nebulization TID  . polyvinyl alcohol  2 drop Both Eyes Daily  . potassium chloride  40 mEq Oral Q4H  . sodium chloride  3 mL Intravenous Q12H  . warfarin  7.5 mg Oral ONCE-1800  . Warfarin - Pharmacist Dosing Inpatient   Does not apply q1800   . heparin 2,200 Units/hr (05/07/14 0854)    OBJECTIVE  Filed Vitals:   05/06/14 1941 05/06/14 2118 05/07/14 0439 05/07/14 0707  BP:  137/85 114/73   Pulse:  87 55   Temp:  98.9 F (37.2 C) 98.2 F (36.8 C)   TempSrc:  Oral Oral   Resp:  16 16   Height:      Weight:   243 lb 9.7 oz (110.5 kg)   SpO2: 95% 97% 97% 96%    Intake/Output Summary (Last 24 hours) at 05/07/14 1047 Last data filed at 05/07/14 0926  Gross per 24 hour  Intake 1648.98 ml  Output   2175 ml  Net -526.02 ml   Filed Weights   05/05/14 1640 05/06/14 0452 05/07/14 0439  Weight: 248 lb 4.8 oz (112.628 kg) 241 lb 13.5 oz (109.7 kg) 243 lb 9.7 oz (110.5 kg)    PHYSICAL EXAM  General: Pleasant, NAD. Neuro: Alert and oriented X 3. Moves all extremities spontaneously. Psych: Normal affect. HEENT:  Normal  Neck: Supple without bruits or JVD. Lungs:  Resp  regular and unlabored, CTA. Heart: RRR no s3, s4, or murmurs. Abdomen: Soft, non-tender, non-distended, BS + x 4.  Extremities: No clubbing, cyanosis or edema. DP/PT/Radials 2+ and equal bilaterally.  Accessory Clinical Findings  CBC  Recent Labs  05/05/14 1117 05/06/14 0246 05/07/14 0008  WBC 9.5 9.1 11.7*  NEUTROABS 7.7  --   --   HGB 14.3 13.8 14.4  HCT 41.8 40.5 42.5  MCV 92.7 91.0 91.8  PLT 176 197 338   Basic Metabolic Panel  Recent Labs  05/06/14 0251 05/07/14 0008  NA 138 137  K 2.5* 3.3*  CL 97 96  CO2 24 28  GLUCOSE 156* 115*  BUN 49* 49*  CREATININE 1.94* 1.93*  CALCIUM 8.6 8.6  MG  --  2.2    Recent Labs  05/06/14 1302 05/06/14 1827 05/07/14 0008  TROPONINI 0.12* 0.12* 0.11*   BNP Invalid input(s): POCBNP D-Dimer  Recent Labs  05/05/14 1413  DDIMER 1.62*   Hemoglobin A1C  Recent Labs  05/05/14 1622  HGBA1C 6.4*   Radiology/Studies  Dg Chest 2 View  05/05/2014   CLINICAL DATA:  Cough, congestion 4 days.  No fever.  No chest pain.  IMPRESSION: Cardiomegaly without congestive failure.  Calcified right pleural plaque as can be seen with asbestos exposure.     Nm Pulmonary Perf And Vent  05/05/2014   CLINICAL DATA:  Shortness of breath.   IMPRESSION: Negative for pulmonary embolus.     TELE   ECG: a-fib, LBBB    ASSESSMENT AND PLAN  1. Acute on chronic systolic CHF: He has known chronic systolic CHF. It appear that he has developed a viral Respiratory illness that is caused an exacerbation of his congestive heart failure. He has a very deep and congested cough that sounds more like a respiratory infection and not CHF exacerbation. He is coughing up yellow phlegm. His wife was ill with a similar syndrome earlier this week. He reports significant improvement with antibiotics and respiratory therapy.   We will repeat his echocardiogram. His initial troponin level is minimally elevated. We'll continue to cycle his troponin  levels. At this point I do not think that there is any indication that this represents an acute coronary syndrome.  I would hold Lasix as no signs of fluid overload and acute on CKD.   2. Troponin elevation - minimal, now downtrending, in the settings of acute on CKD. I would discontinue heparin. No further ischemic work up needed at this point.   3. Severe muscle weakness: The patient's main reason for calling EMS was that he was severely weak. It should be noted that his potassium is 2.5. K now 3.3, we will continue replacing.   4. Upper respiratory tract infection: The patient has had a cold for the past several days. He now has a very deep cough productive of yellow-green sputum. I suspect that he has a viral illness. He's not febrile. I doubt that this is a bacterial pneumonia.  5. Chronic atrial fib: On coumadin. INR ws subtheraputic. On heparin.     Signed, Dorothy Spark MD, [facility] 05/07/2014

## 2014-05-07 NOTE — Progress Notes (Signed)
ANTICOAGULATION CONSULT NOTE - Follow Up Consult  Pharmacy Consult for heparin Indication: atrial fibrillation  Allergies  Allergen Reactions  . Avelox [Moxifloxacin Hcl In Nacl] Swelling, Rash and Other (See Comments)    Patient became hypotensive after infusion started Because of a history of documented adverse serious drug reaction;Medi Alert bracelet  is recommended  . Penicillins Anaphylaxis    REACTION: anaphylaxis Because of a history of documented adverse serious drug reaction;Medi Alert bracelet  is recommended    Patient Measurements: Height: 6' (182.9 cm) Weight: 243 lb 9.7 oz (110.5 kg) IBW/kg (Calculated) : 77.6 Heparin Dosing Weight: 101 kg  Vital Signs: Temp: 98.2 F (36.8 C) (02/17 0439) Temp Source: Oral (02/17 0439) BP: 114/73 mmHg (02/17 0439) Pulse Rate: 55 (02/17 0439)  Labs:  Recent Labs  05/05/14 1117  05/06/14 0246 05/06/14 0251  05/06/14 1302 05/06/14 1827 05/06/14 1950 05/07/14 0008 05/07/14 0620  HGB 14.3  --  13.8  --   --   --   --   --  14.4  --   HCT 41.8  --  40.5  --   --   --   --   --  42.5  --   PLT 176  --  197  --   --   --   --   --  217  --   LABPROT 19.2*  --   --  20.0*  --   --   --   --  20.3*  --   INR 1.60*  --   --  1.69*  --   --   --   --  1.72*  --   HEPARINUNFRC  --   --  <0.10*  --   < >  --   --  0.16* 0.25* 0.27*  CREATININE 2.11*  --   --  1.94*  --   --   --   --  1.93*  --   TROPONINI  --   < >  --  0.14*  --  0.12* 0.12*  --  0.11*  --   < > = values in this interval not displayed.  Estimated Creatinine Clearance: 43.8 mL/min (by C-G formula based on Cr of 1.93).   Assessment: Patient is a 74 y.o Mon heparin and coumadin for afib.  Heparin remains slightly subtherapeutic at 0.27 and INR continues to trend up to 1.72 today.  No bleeding documented.   Goal of Therapy:  INR 2-3 Heparin level 0.3-0.7 units/ml Monitor platelets by anticoagulation protocol: Yes   Plan:  - increase heparin drip to 2200  units/hr - recheck 6 hour level - coumadin 7.5mg  (1.5x home dose) PO x1 today   [name] P 05/07/2014,8:33 AM

## 2014-05-07 NOTE — Evaluation (Addendum)
Physical Therapy Evaluation Patient Details Name: [name] MRN: [id number] DOB: 1940/11/11 Today's Date: 05/07/2014   History of Present Illness  74 y.o. male admitted with acute respiratory failure with hypoxia.  Hx of afib, HTN, DM, AAA, and back surgery.  Clinical Impression  Pt admitted with the above complications. Pt currently with functional limitations due to the deficits listed below (see PT Problem List). Ambulates up to 90 feet today with supervision for safety. Pt reports feeling weaker, and slower compared to his baseline mobility. SpO2 maintained 95% and greater on room air during therapy HR 60-90 at rest and ambulating respectively. He has 24 hour care from his wife at home. Feel he is adequate for d/c from a mobility standpoint when medically ready. Will benefit from HHPT at d/c to improve strength/endurance/function.  Pt will benefit from skilled PT to increase their independence and safety with mobility to allow discharge to the venue listed below.       Follow Up Recommendations Home health PT;Supervision for mobility/OOB    Equipment Recommendations  None recommended by PT    Recommendations for Other Services OT consult     Precautions / Restrictions Precautions Precautions: Fall Restrictions Weight Bearing Restrictions: No      Mobility  Bed Mobility Overal bed mobility: Modified Independent                Transfers Overall transfer level: Needs assistance Equipment used: Rolling walker (2 wheeled) Transfers: Sit to/from Stand Sit to Stand: Min guard         General transfer comment: Min guard for safety. VC for hand placement. Performed from bed x2. Good control with descent into recliner.  Ambulation/Gait Ambulation/Gait assistance: Supervision Ambulation Distance (Feet): 90 Feet Assistive device: Rolling walker (2 wheeled) Gait Pattern/deviations: Step-through pattern;Decreased stride length;Trunk flexed Gait velocity:  decreased   General Gait Details: Educated on safe DME use with a rolling walker. VC for upright posture, RW adjusted for appropriate height. SpO2 maintained 96-100% throughout therapy session while on room air. HR into 90s during ambulatory bout. No loss of balance with use of rolling walker for support, which he relies on quite a bit.  Stairs            Wheelchair Mobility    Modified Rankin (Stroke Patients Only)       Balance Overall balance assessment: Needs assistance Sitting-balance support: No upper extremity supported;Feet supported Sitting balance-Leahy Scale: Fair     Standing balance support: No upper extremity supported;During functional activity Standing balance-Leahy Scale: Fair Standing balance comment: Prior to sitting pt able to release walker briefly                              Pertinent Vitals/Pain Pain Assessment: No/denies pain  HR 60 at rest SpO2 95% and greater on room air.    Home Living Family/patient expects to be discharged to:: Private residence Living Arrangements: Spouse/significant other Available Help at Discharge: Family;Available 24 hours/day Type of Home: House Home Access: Stairs to enter Entrance Stairs-Rails: Right Entrance Stairs-Number of Steps: 2 Home Layout: One level Home Equipment: Cane - single point;Walker - 2 wheels;Bedside commode;Shower seat      Prior Function Level of Independence: Independent with assistive device(s)         Comments: using cane for mobility, although limited ambulation due to back surgery     Hand Dominance   Dominant Hand: Left    Extremity/Trunk Assessment  Upper Extremity Assessment: Defer to OT evaluation           Lower Extremity Assessment: Generalized weakness (Complains of Lt knee pain with edema)         Communication   Communication: No difficulties  Cognition Arousal/Alertness: Awake/alert Behavior During Therapy: WFL for tasks  assessed/performed Overall Cognitive Status: Within Functional Limits for tasks assessed                      General Comments General comments (skin integrity, edema, etc.): Educated on safe mobility and importance of being mobile as tolerated. Wife present and very involved in patients recovery.     Exercises General Exercises - Lower Extremity Ankle Circles/Pumps: AROM;Both;10 reps;Seated Long Arc Quad: Strengthening;Both;5 reps;Seated Hip Flexion/Marching: Strengthening;Both;5 reps;Seated      Assessment/Plan    PT Assessment Patient needs continued PT services  PT Diagnosis Difficulty walking;Abnormality of gait;Generalized weakness   PT Problem List Decreased strength;Decreased range of motion;Decreased activity tolerance;Decreased balance;Decreased mobility;Decreased knowledge of use of DME  PT Treatment Interventions DME instruction;Gait training;Stair training;Functional mobility training;Therapeutic activities;Therapeutic exercise;Balance training;Neuromuscular re-education;Patient/family education   PT Goals (Current goals can be found in the Care Plan section) Acute Rehab PT Goals Patient Stated Goal: Go home PT Goal Formulation: With patient Time For Goal Achievement: 05/21/14 Potential to Achieve Goals: Good    Frequency Min 3X/week   Barriers to discharge        Co-evaluation               End of Session Equipment Utilized During Treatment: Gait belt Activity Tolerance: Patient tolerated treatment well Patient left: in chair;with call bell/phone within reach;with family/visitor present Nurse Communication: Mobility status         Time: 9702-6378 PT Time Calculation (min) (ACUTE ONLY): 26 min   Charges:   PT Evaluation $Initial PT Evaluation Tier I: 1 Procedure PT Treatments $Gait Training: 8-22 mins   PT G CodesEllouise Newer 05/07/2014, 3:02 PM  Camille Bal [place], [place]

## 2014-05-07 NOTE — Progress Notes (Signed)
[facility]                                                                              Patient Demographics  [name], is a 74 y.o. male, DOB - December 01, 1940, [id number]  Admit date - 05/05/2014   Admitting Physician No admitting provider for patient encounter.  Outpatient Primary MD for the patient is [name], MD  LOS - 2   Chief Complaint  Patient presents with  . Cough      Admit HPI/Interim history 74 year old male with past history of COPD, congestive heart failure EF of 25-30%, valvular disease who presented with productive cough for 4 days.  Patient admitted for acute heart failure exacerbation along with possible COPD exacerbation.  Continue Z-Pak, nebulizers, diuresis, supplemental oxygen .  Assessment & Plan   Acute respiratory failure with hypoxia -Multifactorial causes including CHF exacerbation as well as possible COPD exacerbation -Chest x-ray showed cardiomegaly without congestive failure, calcified right pleural plaque -Continue supplemental oxygen to maintain saturations above 90%, diuresis, Z-Pak, nebulizer treatments -Patient had elevated d-dimer, 1.6, VQ scan obtained which was negative for PE  Acute on chronic systolic heart failure exacerbation -Echocardiogram: November 2015 shows an EF of 25-30% -Continue monitor daily weights, intake and output; -1722 since admission. -Initially on IV Lasix 40 mg twice a day , currently off Lasix as per cardiology recommendation, is no evidence of volume overload. - Follow on repeat echo  Elevated troponin -Elevation 0.20>>0.14>>0.12>>0.12>>0.11 -Likely secondary to demand ischemia, no evidence of acute coronary syndrome -Cardiology consulted and appreciated -Continue aspirin, statin, Coreg  Possible COPD exacerbation -Continue Z-Pak, nebs -Sputum culture pending -Patient currently not on steroids as he is currently not wheezing   Acute renal failure on CKD Stage 3 -Baseline creatinine of  1.5-1.6 -Upon admission 2.11, currently improving 1.94 -Will continue to monitor closely as patient will be on diuresis  Atrial fibrillation with subtherapeutic INR -Patient currently rate controlled -CHADSVASc 4 -Continue heparin and Coumadin per pharmacy -Continue Coreg  Relative hypotension -Resolved, will continue to monitor vitals closely -Amlodipine and Cozaar held  Acute urinary retention -Patient had bladder scan the emergency department showing 460 mL's of urine was retained -Foley catheter placed  Diabetes mellitus, type II -Hemoglobin A1c 6.4 -Continue Lantus, sliding scale CBG monitoring  Hypokalemia Likely secondary to diuresis, will replace and continue to monitor  Code Status: Full  Family Communication: Spoke with wife at bedside  Disposition Plan: Home in 24-48 hours  Time Spent in minutes   30 minutes  Procedures  None  Consults  Cardiology  DVT Prophylaxis  Coumadin/Heparin  Lab Results  Component Value Date   PLT 217 05/07/2014    Medications  Scheduled Meds: . acetaminophen  650 mg Oral BH-q7a  . aspirin EC  81 mg Oral Daily  . atorvastatin  10 mg Oral Daily  . azithromycin  250 mg Oral Daily  . carvedilol  6.25 mg Oral BID  . guaiFENesin  1,200 mg Oral BID  . insulin aspart  0-15 Units Subcutaneous TID WC  . insulin aspart  0-5 Units Subcutaneous QHS  . insulin detemir  30 Units Subcutaneous QHS  . ipratropium-albuterol  3 mL Nebulization TID  .  polyvinyl alcohol  2 drop Both Eyes Daily  . sodium chloride  3 mL Intravenous Q12H  . warfarin  7.5 mg Oral ONCE-1800  . Warfarin - Pharmacist Dosing Inpatient   Does not apply q1800   Continuous Infusions: . heparin     PRN Meds:.sodium chloride, acetaminophen, albuterol, ALPRAZolam, chlorpheniramine-HYDROcodone, gabapentin, naphazoline-pheniramine, nitroGLYCERIN, ondansetron (ZOFRAN) IV, sodium chloride  Antibiotics    Anti-infectives    Start     Dose/Rate Route Frequency  Ordered Stop   05/06/14 1000  azithromycin (ZITHROMAX) tablet 250 mg     250 mg Oral Daily 05/05/14 1819 05/10/14 0959   05/05/14 1830  azithromycin (ZITHROMAX) tablet 500 mg     500 mg Oral Daily 05/05/14 1819 05/05/14 1959      Subjective:   [name] seen and examined today.  Patient feels his breathing is improved since coming to the hospital. He denies any chest pain, abdominal pain, dizziness, headache at this time.    Objective:   Filed Vitals:   05/07/14 0439 05/07/14 0707 05/07/14 1326 05/07/14 1407  BP: 114/73   125/76  Pulse: 55   72  Temp: 98.2 F (36.8 C)   97.3 F (36.3 C)  TempSrc: Oral   Oral  Resp: 16   20  Height:      Weight: 110.5 kg (243 lb 9.7 oz)     SpO2: 97% 96% 96% 99%    Wt Readings from Last 3 Encounters:  05/07/14 110.5 kg (243 lb 9.7 oz)  04/02/14 113.399 kg (250 lb)  03/31/14 114.306 kg (252 lb)     Intake/Output Summary (Last 24 hours) at 05/07/14 1541 Last data filed at 05/07/14 1407  Gross per 24 hour  Intake 1464.21 ml  Output   2275 ml  Net -810.79 ml    Exam  General: Well developed, well nourished, NAD, appears stated age  5: NCAT,  mucous membranes moist.   Cardiovascular: S1 S2 auscultated, Irregular  Respiratory: Coarse breath sounds, no wheezing  Abdomen: Soft, nontender, nondistended, + bowel sounds  Extremities: warm dry without cyanosis clubbing or edema  Neuro: AAOx3, nonfocal  Psych: Normal affect and demeanor with intact judgement and insight  Data Review   Micro Results Recent Results (from the past 240 hour(s))  Urine culture     Status: None (Preliminary result)   Collection Time: 05/05/14  8:17 PM  Result Value Ref Range Status   Specimen Description URINE, CATHETERIZED  Final   Special Requests NONE  Final   Colony Count   Final    3,000 COLONIES/ML Performed at [facility]    Culture   Final    Culture reincubated for better growth Performed at [facility]     Report Status PENDING  Incomplete  Culture, expectorated sputum-assessment     Status: None   Collection Time: 05/06/14  3:28 PM  Result Value Ref Range Status   Specimen Description SPUTUM  Final   Special Requests Immunocompromised  Final   Sputum evaluation   Final    THIS SPECIMEN IS ACCEPTABLE. RESPIRATORY CULTURE REPORT TO FOLLOW.   Report Status 05/06/2014 FINAL  Final  Culture, respiratory (NON-Expectorated)     Status: None (Preliminary result)   Collection Time: 05/06/14  3:28 PM  Result Value Ref Range Status   Specimen Description SPUTUM  Final   Special Requests NONE  Final   Gram Stain   Final    ABUNDANT WBC PRESENT,BOTH PMN AND MONONUCLEAR FEW SQUAMOUS EPITHELIAL CELLS  PRESENT MODERATE GRAM POSITIVE COCCI IN PAIRS IN CHAINS RARE GRAM NEGATIVE RODS Performed at [facility]    Culture   Final    Culture reincubated for better growth Performed at [facility]    Report Status PENDING  Incomplete    Radiology Reports Dg Chest 2 View  05/05/2014   CLINICAL DATA:  Cough, congestion 4 days.  No fever.  No chest pain.  EXAM: CHEST  2 VIEW  COMPARISON:  01/01/2014  FINDINGS: Mild bilateral interstitial thickening. Right basilar calcified pleural plaque. There is no focal parenchymal opacity, pleural effusion, or pneumothorax. There is stable cardiomegaly. There is evidence of prior CABG. There is a 3-lead cardiac pacer.  The osseous structures are unremarkable.  IMPRESSION: Cardiomegaly without congestive failure.  Calcified right pleural plaque as can be seen with asbestos exposure.   Electronically Signed   By: Kathreen Devoid   On: 05/05/2014 11:00   Nm Pulmonary Perf And Vent  05/05/2014   CLINICAL DATA:  Shortness of breath.  COPD.  EXAM: NUCLEAR MEDICINE VENTILATION - PERFUSION LUNG SCAN  TECHNIQUE: Ventilation images were obtained in multiple projections using inhaled aerosol technetium 99 M DTPA. Perfusion images were obtained in multiple projections  after intravenous injection of Tc-54m MAA.  RADIOPHARMACEUTICALS:  40 mCi Tc-69m DTPA aerosol and 6 mCi Tc-66m MAA  COMPARISON:  CT chest 02/13/2010. PA and lateral views tear 2 there is a PA and lateral chest this same date.  FINDINGS: Ventilation: Ventilation to the left lung appears slightly decreased compatible with volume loss scar as seen on the prior exams.  Perfusion: No wedge shaped peripheral perfusion defects to suggest acute pulmonary embolism.  IMPRESSION: Negative for pulmonary embolus.   Electronically Signed   By: Inge Rise M.D.   On: 05/05/2014 22:40    CBC  Recent Labs Lab 05/05/14 1117 05/06/14 0246 05/07/14 0008  WBC 9.5 9.1 11.7*  HGB 14.3 13.8 14.4  HCT 41.8 40.5 42.5  PLT 176 197 217  MCV 92.7 91.0 91.8  MCH 31.7 31.0 31.1  MCHC 34.2 34.1 33.9  RDW 14.6 14.6 14.6  LYMPHSABS 0.7  --   --   MONOABS 1.0  --   --   EOSABS 0.0  --   --   BASOSABS 0.0  --   --     Chemistries   Recent Labs Lab 05/05/14 1117 05/06/14 0251 05/07/14 0008  NA 138 138 137  K 3.1* 2.5* 3.3*  CL 100 97 96  CO2 29 24 28   GLUCOSE 208* 156* 115*  BUN 49* 49* 49*  CREATININE 2.11* 1.94* 1.93*  CALCIUM 8.7 8.6 8.6  MG  --   --  2.2   ------------------------------------------------------------------------------------------------------------------ estimated creatinine clearance is 43.8 mL/min (by C-G formula based on Cr of 1.93). ------------------------------------------------------------------------------------------------------------------  Recent Labs  05/05/14 1622  HGBA1C 6.4*   ------------------------------------------------------------------------------------------------------------------ No results for input(s): CHOL, HDL, LDLCALC, TRIG, CHOLHDL, LDLDIRECT in the last 72 hours. ------------------------------------------------------------------------------------------------------------------ No results for input(s): TSH, T4TOTAL, T3FREE, THYROIDAB in the last  72 hours.  Invalid input(s): FREET3 ------------------------------------------------------------------------------------------------------------------ No results for input(s): VITAMINB12, FOLATE, FERRITIN, TIBC, IRON, RETICCTPCT in the last 72 hours.  Coagulation profile  Recent Labs Lab 05/05/14 1117 05/06/14 0251 05/07/14 0008  INR 1.60* 1.69* 1.72*     Recent Labs  05/05/14 1413  DDIMER 1.62*    Cardiac Enzymes  Recent Labs Lab 05/06/14 1302 05/06/14 1827 05/07/14 0008  TROPONINI 0.12* 0.12* 0.11*   ------------------------------------------------------------------------------------------------------------------ Invalid input(s): POCBNP  [facility], Eleno Weimar MD. on 05/07/2014 at 3:41 PM  Between 7am to 7pm - Pager - [phone]  After 7pm go to [email or web address] - password [email or web address]  And look for the night coverage person covering for me after hours  [facility] Office  [phone]

## 2014-05-08 ENCOUNTER — Inpatient Hospital Stay (HOSPITAL_COMMUNITY): Payer: Medicare Other

## 2014-05-08 DIAGNOSIS — R079 Chest pain, unspecified: Secondary | ICD-10-CM

## 2014-05-08 DIAGNOSIS — J441 Chronic obstructive pulmonary disease with (acute) exacerbation: Secondary | ICD-10-CM

## 2014-05-08 LAB — CULTURE, RESPIRATORY: CULTURE: NORMAL

## 2014-05-08 LAB — CBC
HCT: 44.4 % (ref 39.0–52.0)
Hemoglobin: 14.8 g/dL (ref 13.0–17.0)
MCH: 30.5 pg (ref 26.0–34.0)
MCHC: 33.3 g/dL (ref 30.0–36.0)
MCV: 91.5 fL (ref 78.0–100.0)
Platelets: 253 10*3/uL (ref 150–400)
RBC: 4.85 MIL/uL (ref 4.22–5.81)
RDW: 14.5 % (ref 11.5–15.5)
WBC: 14.3 10*3/uL — ABNORMAL HIGH (ref 4.0–10.5)

## 2014-05-08 LAB — BASIC METABOLIC PANEL
Anion gap: 11 (ref 5–15)
BUN: 40 mg/dL — ABNORMAL HIGH (ref 6–23)
CO2: 29 mmol/L (ref 19–32)
Calcium: 8.8 mg/dL (ref 8.4–10.5)
Chloride: 100 mmol/L (ref 96–112)
Creatinine, Ser: 1.38 mg/dL — ABNORMAL HIGH (ref 0.50–1.35)
GFR calc Af Amer: 57 mL/min — ABNORMAL LOW (ref >90)
GFR calc non Af Amer: 49 mL/min — ABNORMAL LOW (ref >90)
Glucose, Bld: 52 mg/dL — ABNORMAL LOW (ref 70–99)
POTASSIUM: 2.6 mmol/L — AB (ref 3.5–5.1)
SODIUM: 140 mmol/L (ref 135–145)

## 2014-05-08 LAB — PROTIME-INR
INR: 2.93 — AB (ref 0.00–1.49)
Prothrombin Time: 30.8 seconds — ABNORMAL HIGH (ref 11.6–15.2)

## 2014-05-08 LAB — CULTURE, RESPIRATORY W GRAM STAIN

## 2014-05-08 LAB — GLUCOSE, CAPILLARY
GLUCOSE-CAPILLARY: 73 mg/dL (ref 70–99)
GLUCOSE-CAPILLARY: 129 mg/dL — AB (ref 70–99)
GLUCOSE-CAPILLARY: 180 mg/dL — AB (ref 70–99)
GLUCOSE-CAPILLARY: 141 mg/dL — AB (ref 70–99)

## 2014-05-08 LAB — HEPARIN LEVEL (UNFRACTIONATED): Heparin Unfractionated: 0.54 IU/mL (ref 0.30–0.70)

## 2014-05-08 LAB — MAGNESIUM: Magnesium: 2.7 mg/dL — ABNORMAL HIGH (ref 1.5–2.5)

## 2014-05-08 MED ORDER — POTASSIUM CHLORIDE CRYS ER 20 MEQ PO TBCR
40.0000 meq | EXTENDED_RELEASE_TABLET | ORAL | Status: AC
  Administered 2014-05-08 (×3): 40 meq via ORAL
  Filled 2014-05-08 (×3): qty 2

## 2014-05-08 MED ORDER — POTASSIUM CHLORIDE 10 MEQ/100ML IV SOLN
10.0000 meq | INTRAVENOUS | Status: DC
Start: 2014-05-08 — End: 2014-05-08

## 2014-05-08 MED ORDER — ALLOPURINOL 100 MG PO TABS
100.0000 mg | ORAL_TABLET | Freq: Every day | ORAL | Status: DC
  Administered 2014-05-08 – 2014-05-10 (×3): 100 mg via ORAL
  Filled 2014-05-08 (×3): qty 1

## 2014-05-08 MED ORDER — DOXYCYCLINE HYCLATE 100 MG IV SOLR
100.0000 mg | Freq: Two times a day (BID) | INTRAVENOUS | Status: DC
  Administered 2014-05-08 – 2014-05-09 (×4): 100 mg via INTRAVENOUS
  Filled 2014-05-08 (×6): qty 100

## 2014-05-08 NOTE — Progress Notes (Signed)
74yo male has worsened on azithromycin for bronchitis w/ increased WBC.  Levaquin initially ordered but pt has severe allergy to FQs and PCNs with no known trial on cephalosporins.  Sputum cx reveals GPC.  Will start doxycycline 100mg  IV Q12H and monitor C/S.  Wynona Neat, PharmD, BCPS 05/08/2014 8:04 AM

## 2014-05-08 NOTE — Progress Notes (Signed)
[facility]                                                                              Patient Demographics  [name], is a 74 y.o. male, DOB - [birth year], QMG:[id number]  Admit date - 05/05/2014   Admitting Physician No admitting provider for patient encounter.  Outpatient Primary MD for the patient is [name] Cobble, MD  LOS - 3   Chief Complaint  Patient presents with  . Cough      Admit HPI/Interim history 74 year old male with past history of COPD, congestive heart failure EF of 25-30%, valvular disease who presented with productive cough for 4 days.  Patient admitted for acute heart failure exacerbation along with possible COPD exacerbation.  Continue Z-Pak, nebulizers, diuresis, supplemental oxygen .  Assessment & Plan   Acute respiratory failure with hypoxia -Multifactorial causes including CHF exacerbation as well as possible COPD exacerbation -Chest x-ray showed cardiomegaly without congestive failure, calcified right pleural plaque -Continue supplemental oxygen to maintain saturations above 90%, diuresis, nebulizer treatments -Patient had elevated d-dimer, 1.6, VQ scan obtained which was negative for PE  Acute on chronic systolic heart failure exacerbation -Echocardiogram: November 2015 shows an EF of 25-30% - Repeat echo pending -Continue monitor daily weights, intake and output; -948 since admission. -Initially on IV Lasix 40 mg twice a day , currently off Lasix as per cardiology recommendation,  no evidence of volume overload. - Appears to be euvolemic, will resume Lasix once a couple is done .  Elevated troponin -Elevation 0.20>>0.14>>0.12>>0.12>>0.11 -Likely secondary to demand ischemia, no evidence of acute coronary syndrome -Cardiology consulted and appreciated -Continue aspirin, statin, Coreg  Possible COPD exacerbation -We'll change to Z-Pak to doxycycline giving  leukocytosis. -Sputum culture growing moderate gram-positive cocci in pairs  and chains and rare gram-negative rods(normal oropharyngeal Flora) -Patient currently not on steroids as he is currently not wheezing   Acute renal failure on CKD Stage 3 -Baseline creatinine of 1.5-1.6 -Upon admission 2.11, currently improving 1.38  Atrial fibrillation with subtherapeutic INR -Patient currently rate controlled -CHADSVASc 4 - Continue with Coreg  - INR is therapeutic today, off heparin drip   Relative hypotension -Resolved, will continue to monitor vitals closely -Amlodipine and Cozaar held  Acute urinary retention -Patient had bladder scan the emergency department showing 460 mL's of urine was retained - No recurrence   Diabetes mellitus, type II -Hemoglobin A1c 6.4 -Continue Lantus, sliding scale CBG monitoring  Hypokalemia - Repleted, recheck in a.m.  - Magnesium within normal limits .  Code Status: Full  Family Communication: Spoke with wife at bedside  Disposition Plan: Home in 24-48 hours  Time Spent in minutes   30 minutes  Procedures  None  Consults  Cardiology  DVT Prophylaxis  Coumadin/Heparin  Lab Results  Component Value Date   PLT 253 05/08/2014    Medications  Scheduled Meds: . acetaminophen  650 mg Oral BH-q7a  . aspirin EC  81 mg Oral Daily  . atorvastatin  10 mg Oral Daily  . carvedilol  6.25 mg Oral BID  . doxycycline (VIBRAMYCIN) IV  100 mg Intravenous Q12H  . guaiFENesin  1,200 mg Oral BID  . insulin aspart  0-15 Units Subcutaneous TID WC  . insulin aspart  0-5 Units Subcutaneous QHS  . insulin detemir  30 Units Subcutaneous QHS  . polyvinyl alcohol  2 drop Both Eyes Daily  . potassium chloride  40 mEq Oral Q4H  . sodium chloride  3 mL Intravenous Q12H  . Warfarin - Pharmacist Dosing Inpatient   Does not apply q1800   Continuous Infusions:   PRN Meds:.sodium chloride, acetaminophen, albuterol, ALPRAZolam, chlorpheniramine-HYDROcodone, gabapentin, naphazoline-pheniramine, nitroGLYCERIN, ondansetron (ZOFRAN) IV,  sodium chloride  Antibiotics    Anti-infectives    Start     Dose/Rate Route Frequency Ordered Stop   05/08/14 0900  doxycycline (VIBRAMYCIN) 100 mg in dextrose 5 % 250 mL IVPB     100 mg 125 mL/hr over 120 Minutes Intravenous Every 12 hours 05/08/14 0802     05/06/14 1000  azithromycin (ZITHROMAX) tablet 250 mg  Status:  Discontinued     250 mg Oral Daily 05/05/14 1819 05/08/14 0739   05/05/14 1830  azithromycin (ZITHROMAX) tablet 500 mg     500 mg Oral Daily 05/05/14 1819 05/05/14 1959      Subjective:   [name] seen and examined today.  Patient feels his breathing is improved since coming to the hospital. He denies any chest pain, abdominal pain, dizziness, headache at this time.    Objective:   Filed Vitals:   05/07/14 1407 05/07/14 2005 05/08/14 0432 05/08/14 0808  BP: 125/76 140/90 140/91   Pulse: 72 63 82   Temp: 97.3 F (36.3 C) 99 F (37.2 C) 97.5 F (36.4 C)   TempSrc: Oral Oral Oral   Resp: 20 19 19    Height:      Weight:   110.3 kg (243 lb 2.7 oz)   SpO2: 99% 97% 94% 98%    Wt Readings from Last 3 Encounters:  05/08/14 110.3 kg (243 lb 2.7 oz)  04/02/14 113.399 kg (250 lb)  03/31/14 114.306 kg (252 lb)     Intake/Output Summary (Last 24 hours) at 05/08/14 1355 Last data filed at 05/08/14 0600  Gross per 24 hour  Intake 1613.2 ml  Output   1100 ml  Net  513.2 ml    Exam  General: Well developed, well nourished, NAD, appears stated age  HEENT: NCAT,  mucous membranes moist.   Cardiovascular: S1 S2 auscultated, Irregular  Respiratory: Coarse breath sounds, no wheezing  Abdomen: Soft, nontender, nondistended, + bowel sounds  Extremities: warm dry without cyanosis clubbing or edema  Neuro: AAOx3, nonfocal  Psych: Normal affect and demeanor with intact judgement and insight  Data Review   Micro Results Recent Results (from the past 240 hour(s))  Urine culture     Status: None (Preliminary result)   Collection Time: 05/05/14   8:17 PM  Result Value Ref Range Status   Specimen Description URINE, CATHETERIZED  Final   Special Requests NONE  Final   Colony Count   Final    3,000 COLONIES/ML Performed at [facility]    Culture   Final    ENTEROCOCCUS SPECIES Performed at [facility]    Report Status PENDING  Incomplete  Culture, expectorated sputum-assessment     Status: None   Collection Time: 05/06/14  3:28 PM  Result Value Ref Range Status   Specimen Description SPUTUM  Final   Special Requests Immunocompromised  Final   Sputum evaluation   Final    THIS SPECIMEN IS ACCEPTABLE. RESPIRATORY CULTURE REPORT TO FOLLOW.   Report Status 05/06/2014 FINAL  Final  Culture, respiratory (NON-Expectorated)     Status: None   Collection Time: 05/06/14  3:28 PM  Result Value Ref Range Status   Specimen Description SPUTUM  Final   Special Requests NONE  Final   Gram Stain   Final    ABUNDANT WBC PRESENT,BOTH PMN AND MONONUCLEAR FEW SQUAMOUS EPITHELIAL CELLS PRESENT MODERATE GRAM POSITIVE COCCI IN PAIRS IN CHAINS RARE GRAM NEGATIVE RODS Performed at [facility]    Culture   Final    NORMAL OROPHARYNGEAL FLORA Performed at [facility]    Report Status 05/08/2014 FINAL  Final    Radiology Reports Dg Chest 2 View  05/08/2014   CLINICAL DATA:  Cough for 5 days  EXAM: CHEST  2 VIEW  COMPARISON:  05/05/2014 01/01/2014, 03/15/2013  FINDINGS: Right-sided AICD in place. Spinal stimulator electrodes noted. Evidence of median sternotomy. Moderate enlargement of the cardiac silhouette is reidentified with persistent retrocardiac opacity. Calcified right pleural plaque noted. Small to moderate left pleural fluid is stable. Right lung is clear.  IMPRESSION: Stable patchy retrocardiac airspace opacity and small left pleural effusion. 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   Electronically  Signed   By: Conchita Paris M.D.   On: 05/08/2014 13:06   Dg Chest 2 View  05/05/2014   CLINICAL DATA:  Cough, congestion 4 days.  No fever.  No chest pain.  EXAM: CHEST  2 VIEW  COMPARISON:  01/01/2014  FINDINGS: Mild bilateral interstitial thickening. Right basilar calcified pleural plaque. There is no focal parenchymal opacity, pleural effusion, or pneumothorax. There is stable cardiomegaly. There is evidence of prior CABG. There is a 3-lead cardiac pacer.  The osseous structures are unremarkable.  IMPRESSION: Cardiomegaly without congestive failure.  Calcified right pleural plaque as can be seen with asbestos exposure.   Electronically Signed   By: Kathreen Devoid   On: 05/05/2014 11:00   Dg Knee 1-2 Views Left  05/08/2014   CLINICAL DATA:  No injury.  Pain.  EXAM: LEFT KNEE - 1-2 VIEW  COMPARISON:  None.  FINDINGS: Peripheral vascular calcification. No acute bony or joint abnormality identified. No evidence of fracture or dislocation.  IMPRESSION: 1. No acute abnormality. 2. Peripheral vascular disease.   Electronically Signed   By: Marcello Moores  Register   On: 05/08/2014 13:10   Nm Pulmonary Perf And Vent  05/05/2014   CLINICAL DATA:  Shortness of breath.  COPD.  EXAM: NUCLEAR MEDICINE VENTILATION - PERFUSION LUNG SCAN  TECHNIQUE: Ventilation images were obtained in multiple projections using inhaled aerosol technetium 99 M DTPA. Perfusion images were obtained in multiple projections after intravenous injection of Tc-61m MAA.  RADIOPHARMACEUTICALS:  40 mCi Tc-10m DTPA aerosol and 6 mCi Tc-67m MAA  COMPARISON:  CT chest 02/13/2010. PA and lateral views tear 2 there is a PA and lateral chest this same date.  FINDINGS: Ventilation: Ventilation to the left lung appears slightly decreased compatible with volume loss scar as seen on the prior exams.  Perfusion: No wedge shaped peripheral perfusion defects to suggest acute pulmonary embolism.  IMPRESSION: Negative for pulmonary embolus.   Electronically Signed   By:  Inge Rise M.D.   On: 05/05/2014 22:40    CBC  Recent Labs Lab 05/05/14 1117 05/06/14 0246 05/07/14 0008 05/08/14 0456  WBC 9.5 9.1 11.7* 14.3*  HGB 14.3 13.8 14.4 14.8  HCT 41.8 40.5 42.5 44.4  PLT 176 197 217  253  MCV 92.7 91.0 91.8 91.5  MCH 31.7 31.0 31.1 30.5  MCHC 34.2 34.1 33.9 33.3  RDW 14.6 14.6 14.6 14.5  LYMPHSABS 0.7  --   --   --   MONOABS 1.0  --   --   --   EOSABS 0.0  --   --   --   BASOSABS 0.0  --   --   --     Chemistries   Recent Labs Lab 05/05/14 1117 05/06/14 0251 05/07/14 0008 05/08/14 0456  NA 138 138 137 140  K 3.1* 2.5* 3.3* 2.6*  CL 100 97 96 100  CO2 29 24 28 29   GLUCOSE 208* 156* 115* 52*  BUN 49* 49* 49* 40*  CREATININE 2.11* 1.94* 1.93* 1.38*  CALCIUM 8.7 8.6 8.6 8.8  MG  --   --  2.2 2.7*   ------------------------------------------------------------------------------------------------------------------ estimated creatinine clearance is 61.2 mL/min (by C-G formula based on Cr of 1.38). ------------------------------------------------------------------------------------------------------------------  Recent Labs  05/05/14 1622  HGBA1C 6.4*   ------------------------------------------------------------------------------------------------------------------ No results for input(s): CHOL, HDL, LDLCALC, TRIG, CHOLHDL, LDLDIRECT in the last 72 hours. ------------------------------------------------------------------------------------------------------------------ No results for input(s): TSH, T4TOTAL, T3FREE, THYROIDAB in the last 72 hours.  Invalid input(s): FREET3 ------------------------------------------------------------------------------------------------------------------ No results for input(s): VITAMINB12, FOLATE, FERRITIN, TIBC, IRON, RETICCTPCT in the last 72 hours.  Coagulation profile  Recent Labs Lab 05/05/14 1117 05/06/14 0251 05/07/14 0008 05/08/14 0456  INR 1.60* 1.69* 1.72* 2.93*     Recent  Labs  05/05/14 1413  DDIMER 1.62*    Cardiac Enzymes  Recent Labs Lab 05/06/14 1302 05/06/14 1827 05/07/14 0008  TROPONINI 0.12* 0.12* 0.11*   ------------------------------------------------------------------------------------------------------------------ Invalid input(s): POCBNP    Marionette Meskill MD. on 05/08/2014 at 1:55 PM  Between 7am to 7pm - Pager - [phone]  After 7pm go to [email or web address] - password [email or web address]  And look for the night coverage person covering for me after hours  [facility] Office  [phone]

## 2014-05-08 NOTE — Progress Notes (Signed)
Medicare Important Message given? YES  (If response is "NO", the following Medicare IM given date fields will be blank)  Date Medicare IM given: 05/08/14 Medicare IM given by:  Rishit Burkhalter  

## 2014-05-08 NOTE — Progress Notes (Signed)
Inpatient Diabetes Program Recommendations  AACE/ADA: New Consensus Statement on Inpatient Glycemic Control (2013)  Target Ranges:  Prepandial:   less than 140 mg/dL      Peak postprandial:   less than 180 mg/dL (1-2 hours)      Critically ill patients:  140 - 180 mg/dL   Results for [name], [name] (MRN [id number]) as of 05/08/2014 11:57  Ref. Range 05/08/2014 04:56  Glucose Latest Range: 70-99 mg/dL 52 (L)   Inpatient Diabetes Program Recommendations Insulin - Basal: consider decreasing Levemir to 25 units  Thank you  Raoul Pitch BSN, RN,CDE Inpatient Diabetes Coordinator [phone] (team pager)

## 2014-05-08 NOTE — Progress Notes (Signed)
Physical Therapy Treatment Patient Details Name: [name] MRN: [id number] DOB: March 07, 1941 Today's Date: 05/08/2014    History of Present Illness 74 y.o. male admitted with acute respiratory failure with hypoxia.  Hx of afib, HTN, DM, AAA, and back surgery.    PT Comments    Patient progressing towards physical therapy goals however with increased pain in left lateral knee today. Very tender to palpation at lateral joint line and slightly proximal. Denies any trauma and states the pain is now keeping him awake at night. Close guard for safety with ambulation up to 115 feet today. HR elevated to 138 with monitor alerting for Vtach. HR returns to 87 bpm at rest with SpO2 98% on room air. Patient will continue to benefit from skilled physical therapy services to further improve independence with functional mobility.   Follow Up Recommendations  Home health PT;Supervision for mobility/OOB     Equipment Recommendations  None recommended by PT    Recommendations for Other Services OT consult     Precautions / Restrictions Precautions Precautions: Fall Restrictions Weight Bearing Restrictions: No    Mobility  Bed Mobility Overal bed mobility: Modified Independent                Transfers Overall transfer level: Needs assistance Equipment used: Rolling walker (2 wheeled) Transfers: Sit to/from Stand Sit to Stand: Min guard         General transfer comment: Close guard for safety. VC for hand placement. Slow to rise due to Lt knee pain. Performed from lowest bed setting  Ambulation/Gait Ambulation/Gait assistance: Min guard Ambulation Distance (Feet): 115 Feet Assistive device: Rolling walker (2 wheeled) Gait Pattern/deviations: Step-to pattern;Step-through pattern;Decreased step length - right;Decreased stance time - left;Antalgic;Trunk flexed Gait velocity: decreased   General Gait Details: Moderately antalgic gait pattern today due to Lt knee pain. Frequent  cues for upright posture. No buckling noted however with increased reliance on RW to decrease weight bearing through LLE. HR 87-138 monitor indicating Vtach briefly. Denies palpitation, CP, or SOB. SpO2 98% on room air.   Stairs            Wheelchair Mobility    Modified Rankin (Stroke Patients Only)       Balance                                    Cognition Arousal/Alertness: Awake/alert Behavior During Therapy: WFL for tasks assessed/performed Overall Cognitive Status: Within Functional Limits for tasks assessed                      Exercises      General Comments General comments (skin integrity, edema, etc.): Lt lateral knee very tender to palpation. States this pain began this past saturday 2/13 when he stood up from a chair and noticed it was sore. It has progressively gotten worse since this day. Also reports it was very painful last night, keeping him from sleeping.      Pertinent Vitals/Pain Pain Assessment: 0-10 Pain Score:  ("hurts a lot" no value given) Pain Location: Lt knee (laterally at joint line) Pain Descriptors / Indicators: Sore Pain Intervention(s): Limited activity within patient's tolerance;Monitored during session;Repositioned    Home Living                      Prior Function  PT Goals (current goals can now be found in the care plan section) Acute Rehab PT Goals Patient Stated Goal: Go home PT Goal Formulation: With patient Time For Goal Achievement: 05/21/14 Potential to Achieve Goals: Good Progress towards PT goals: Progressing toward goals    Frequency  Min 3X/week    PT Plan Current plan remains appropriate    Co-evaluation             End of Session Equipment Utilized During Treatment: Gait belt Activity Tolerance: Patient tolerated treatment well Patient left: in chair;with call bell/phone within reach     Time: 1027-1049 PT Time Calculation (min) (ACUTE ONLY): 22  min  Charges:  $Gait Training: 8-22 mins                    G Codes:      Ellouise Newer 06/04/14, 11:12 AM Elayne Snare, [place]

## 2014-05-08 NOTE — Progress Notes (Signed)
Patient Name: [name] Date of Encounter: 05/08/2014     Principal Problem:   Acute respiratory failure with hypoxia Active Problems:   Atrial fibrillation   Hypoxia   Acute exacerbation of congestive heart failure   Acute renal failure   COPD exacerbation   Acute urinary retention   Diabetes    SUBJECTIVE  Denies any CP, SOB stable, however continue to have productive cough with thick yellow phlegm  CURRENT MEDS . acetaminophen  650 mg Oral BH-q7a  . aspirin EC  81 mg Oral Daily  . atorvastatin  10 mg Oral Daily  . carvedilol  6.25 mg Oral BID  . doxycycline (VIBRAMYCIN) IV  100 mg Intravenous Q12H  . guaiFENesin  1,200 mg Oral BID  . insulin aspart  0-15 Units Subcutaneous TID WC  . insulin aspart  0-5 Units Subcutaneous QHS  . insulin detemir  30 Units Subcutaneous QHS  . ipratropium-albuterol  3 mL Nebulization TID  . polyvinyl alcohol  2 drop Both Eyes Daily  . potassium chloride  40 mEq Oral Q4H  . sodium chloride  3 mL Intravenous Q12H  . Warfarin - Pharmacist Dosing Inpatient   Does not apply q1800    OBJECTIVE  Filed Vitals:   05/07/14 1407 05/07/14 2005 05/08/14 0432 05/08/14 0808  BP: 125/76 140/90 140/91   Pulse: 72 63 82   Temp: 97.3 F (36.3 C) 99 F (37.2 C) 97.5 F (36.4 C)   TempSrc: Oral Oral Oral   Resp: 20 19 19    Height:      Weight:   243 lb 2.7 oz (110.3 kg)   SpO2: 99% 97% 94% 98%    Intake/Output Summary (Last 24 hours) at 05/08/14 0853 Last data filed at 05/08/14 0600  Gross per 24 hour  Intake 1973.2 ml  Output   1100 ml  Net  873.2 ml   Filed Weights   05/06/14 0452 05/07/14 0439 05/08/14 0432  Weight: 241 lb 13.5 oz (109.7 kg) 243 lb 9.7 oz (110.5 kg) 243 lb 2.7 oz (110.3 kg)    PHYSICAL EXAM  General: Pleasant, NAD. Neuro: Alert and oriented X 3. Moves all extremities spontaneously. Psych: Normal affect. HEENT:  Normal  Neck: Supple without bruits or JVD. Lungs:  Resp regular and unlabored. Largely  CTA Heart: irregular.  no s3, s4, or murmurs. Abdomen: Soft, non-tender, non-distended, BS + x 4.  Extremities: No clubbing, cyanosis or edema. DP/PT/Radials 2+ and equal bilaterally.  Accessory Clinical Findings  CBC  Recent Labs  05/05/14 1117  05/07/14 0008 05/08/14 0456  WBC 9.5  < > 11.7* 14.3*  NEUTROABS 7.7  --   --   --   HGB 14.3  < > 14.4 14.8  HCT 41.8  < > 42.5 44.4  MCV 92.7  < > 91.8 91.5  PLT 176  < > 217 253  < > = values in this interval not displayed. Basic Metabolic Panel  Recent Labs  05/07/14 0008 05/08/14 0456  NA 137 140  K 3.3* 2.6*  CL 96 100  CO2 28 29  GLUCOSE 115* 52*  BUN 49* 40*  CREATININE 1.93* 1.38*  CALCIUM 8.6 8.8  MG 2.2  --    Cardiac Enzymes  Recent Labs  05/06/14 1302 05/06/14 1827 05/07/14 0008  TROPONINI 0.12* 0.12* 0.11*   BNP Invalid input(s): POCBNP D-Dimer  Recent Labs  05/05/14 1413  DDIMER 1.62*   Hemoglobin A1C  Recent Labs  05/05/14 1622  HGBA1C 6.4*  TELE A-fib with LBBB, HR 70-80s    ECG  No new EKG  Echocardiogram  - Left ventricle: The cavity size was severely dilated. Wall thickness was increased in a pattern of mild LVH. Systolic function was severely reduced. The estimated ejection fraction was in the range of 25% to 30%. - Left atrium: The atrium was severely dilated. - Right ventricle: The cavity size was mildly dilated. Systolic function was mildly to moderately reduced. - Right atrium: The atrium was severely dilated.    Radiology/Studies  Dg Chest 2 View  05/05/2014   CLINICAL DATA:  Cough, congestion 4 days.  No fever.  No chest pain.  EXAM: CHEST  2 VIEW  COMPARISON:  01/01/2014  FINDINGS: Mild bilateral interstitial thickening. Right basilar calcified pleural plaque. There is no focal parenchymal opacity, pleural effusion, or pneumothorax. There is stable cardiomegaly. There is evidence of prior CABG. There is a 3-lead cardiac pacer.  The osseous structures  are unremarkable.  IMPRESSION: Cardiomegaly without congestive failure.  Calcified right pleural plaque as can be seen with asbestos exposure.   Electronically Signed   By: Kathreen Devoid   On: 05/05/2014 11:00   Nm Pulmonary Perf And Vent  05/05/2014   CLINICAL DATA:  Shortness of breath.  COPD.  EXAM: NUCLEAR MEDICINE VENTILATION - PERFUSION LUNG SCAN  TECHNIQUE: Ventilation images were obtained in multiple projections using inhaled aerosol technetium 99 M DTPA. Perfusion images were obtained in multiple projections after intravenous injection of Tc-10m MAA.  RADIOPHARMACEUTICALS:  40 mCi Tc-58m DTPA aerosol and 6 mCi Tc-43m MAA  COMPARISON:  CT chest 02/13/2010. PA and lateral views tear 2 there is a PA and lateral chest this same date.  FINDINGS: Ventilation: Ventilation to the left lung appears slightly decreased compatible with volume loss scar as seen on the prior exams.  Perfusion: No wedge shaped peripheral perfusion defects to suggest acute pulmonary embolism.  IMPRESSION: Negative for pulmonary embolus.   Electronically Signed   By: Inge Rise M.D.   On: 05/05/2014 22:40    ASSESSMENT AND PLAN  1. Acute respiratory insufficiency  - likely caused by respiratory issue, no sign of HF on exam. Consider restart home PO lasix today if echo unchanged  2. chronic systolic HF  - Echo 59/03/6382 EF 25-30%, mild to moderately reduced RVEF  - stable. Pending echocardiogram to reassess  3. Mildly elevated trop  4. URI: WBC trending up.  5. Chronic a-fib 6. Hypokalemia: per primary team  Signed, Almyra Deforest PA-C Pager: [phone]   The patient was seen, examined and discussed with Almyra Deforest, PA-C and I agree with the above.   We will continue to follow, no signs of CHF, restart Lasix 40 mg po daily prior to discharge. Improving respiratory status.   Dorothy Spark 05/08/2014

## 2014-05-08 NOTE — Progress Notes (Signed)
  Echocardiogram 2D Echocardiogram has been performed.  [name] 05/08/2014, 10:14 AM

## 2014-05-08 NOTE — Progress Notes (Signed)
CRITICAL VALUE ALERT  Critical value received:  Potassium 2.6  Date of notification:  05/08/2014  Time of notification:  0614  Critical value read back: yes  Nurse who received alert:  Addison Bailey  MD notified (1st page):  Forrest Moron  Time of first page:  0615  MD notified (2nd page):  Time of second page:  Responding MD:  Forrest Moron  Time MD responded:  [phone]

## 2014-05-08 NOTE — Progress Notes (Addendum)
ANTICOAGULATION CONSULT NOTE - Follow Up Consult  Pharmacy Consult for heparin and coumadin Indication: atrial fibrillation  Allergies  Allergen Reactions  . Avelox [Moxifloxacin Hcl In Nacl] Swelling, Rash and Other (See Comments)    Patient became hypotensive after infusion started Because of a history of documented adverse serious drug reaction;Medi Alert bracelet  is recommended  . Penicillins Anaphylaxis    REACTION: anaphylaxis Because of a history of documented adverse serious drug reaction;Medi Alert bracelet  is recommended    Patient Measurements: Height: 6' (182.9 cm) Weight: 243 lb 2.7 oz (110.3 kg) IBW/kg (Calculated) : 77.6 Heparin Dosing Weight: 101 kg  Vital Signs: Temp: 97.5 F (36.4 C) (02/18 0432) Temp Source: Oral (02/18 0432) BP: 140/91 mmHg (02/18 0432) Pulse Rate: 82 (02/18 0432)  Labs:  Recent Labs  05/06/14 0246 05/06/14 0251  05/06/14 1302 05/06/14 1827  05/07/14 0008 05/07/14 0620 05/07/14 1442 05/08/14 0456  HGB 13.8  --   --   --   --   --  14.4  --   --  14.8  HCT 40.5  --   --   --   --   --  42.5  --   --  44.4  PLT 197  --   --   --   --   --  217  --   --  253  LABPROT  --  20.0*  --   --   --   --  20.3*  --   --  30.8*  INR  --  1.69*  --   --   --   --  1.72*  --   --  2.93*  HEPARINUNFRC <0.10*  --   < >  --   --   < > 0.25* 0.27* 0.44 0.54  CREATININE  --  1.94*  --   --   --   --  1.93*  --   --  1.38*  TROPONINI  --  0.14*  --  0.12* 0.12*  --  0.11*  --   --   --   < > = values in this interval not displayed.  Estimated Creatinine Clearance: 61.2 mL/min (by C-G formula based on Cr of 1.38).   Assessment: Patient is a 74 y.o M on heparin and coumadin for afib.  INR increased sharply from 1.72 to 2.93 today.  With therapeutic INR, heparin drip was d/ced this AM. CBC is stable, no bleeding documented.  Goal of Therapy:  INR 2-3 Heparin level 0.3-0.7 units/ml Monitor platelets by anticoagulation protocol: Yes   Plan:   - Due to sharp jump in INR, will hold coumadin dose today   Donnie Panik P 05/08/2014,8:43 AM

## 2014-05-09 ENCOUNTER — Inpatient Hospital Stay (HOSPITAL_COMMUNITY): Payer: Medicare Other

## 2014-05-09 ENCOUNTER — Telehealth: Payer: Self-pay

## 2014-05-09 LAB — BASIC METABOLIC PANEL
ANION GAP: 10 (ref 5–15)
BUN: 33 mg/dL — ABNORMAL HIGH (ref 6–23)
CHLORIDE: 102 mmol/L (ref 96–112)
CO2: 25 mmol/L (ref 19–32)
CREATININE: 1.3 mg/dL (ref 0.50–1.35)
Calcium: 8.6 mg/dL (ref 8.4–10.5)
GFR calc Af Amer: 61 mL/min — ABNORMAL LOW (ref >90)
GFR calc non Af Amer: 53 mL/min — ABNORMAL LOW (ref >90)
Glucose, Bld: 106 mg/dL — ABNORMAL HIGH (ref 70–99)
POTASSIUM: 3.5 mmol/L (ref 3.5–5.1)
Sodium: 137 mmol/L (ref 135–145)

## 2014-05-09 LAB — PROTIME-INR
INR: 3.41 — AB (ref 0.00–1.49)
Prothrombin Time: 34.6 seconds — ABNORMAL HIGH (ref 11.6–15.2)

## 2014-05-09 LAB — CBC
HCT: 42.8 % (ref 39.0–52.0)
HEMOGLOBIN: 14.4 g/dL (ref 13.0–17.0)
MCH: 31.4 pg (ref 26.0–34.0)
MCHC: 33.6 g/dL (ref 30.0–36.0)
MCV: 93.2 fL (ref 78.0–100.0)
Platelets: 216 10*3/uL (ref 150–400)
RBC: 4.59 MIL/uL (ref 4.22–5.81)
RDW: 14.7 % (ref 11.5–15.5)
WBC: 11.3 10*3/uL — AB (ref 4.0–10.5)

## 2014-05-09 LAB — URINE CULTURE

## 2014-05-09 LAB — SYNOVIAL CELL COUNT + DIFF, W/ CRYSTALS
EOSINOPHILS-SYNOVIAL: 0 % (ref 0–1)
Lymphocytes-Synovial Fld: 0 % (ref 0–20)
MONOCYTE-MACROPHAGE-SYNOVIAL FLUID: 5 % — AB (ref 50–90)
Neutrophil, Synovial: 95 % — ABNORMAL HIGH (ref 0–25)
OTHER CELLS-SYN: 0
WBC, Synovial: 26131 /mm3 — ABNORMAL HIGH (ref 0–200)

## 2014-05-09 LAB — GLUCOSE, CAPILLARY
GLUCOSE-CAPILLARY: 101 mg/dL — AB (ref 70–99)
GLUCOSE-CAPILLARY: 131 mg/dL — AB (ref 70–99)
GLUCOSE-CAPILLARY: 133 mg/dL — AB (ref 70–99)
GLUCOSE-CAPILLARY: 190 mg/dL — AB (ref 70–99)

## 2014-05-09 LAB — URIC ACID: URIC ACID, SERUM: 8.4 mg/dL — AB (ref 4.0–7.8)

## 2014-05-09 MED ORDER — TRIAMCINOLONE ACETONIDE 40 MG/ML IJ SUSP
10.0000 mg | Freq: Once | INTRAMUSCULAR | Status: AC
Start: 2014-05-09 — End: 2014-05-09
  Administered 2014-05-09: 10 mg via INTRA_ARTICULAR
  Filled 2014-05-09: qty 0.25

## 2014-05-09 MED ORDER — METHYLPREDNISOLONE SODIUM SUCC 125 MG IJ SOLR
80.0000 mg | Freq: Once | INTRAMUSCULAR | Status: AC
  Administered 2014-05-09: 80 mg via INTRAVENOUS
  Filled 2014-05-09 (×2): qty 1.28

## 2014-05-09 MED ORDER — POTASSIUM CHLORIDE CRYS ER 20 MEQ PO TBCR
40.0000 meq | EXTENDED_RELEASE_TABLET | Freq: Once | ORAL | Status: AC
  Administered 2014-05-09: 40 meq via ORAL
  Filled 2014-05-09: qty 2

## 2014-05-09 MED ORDER — COLCHICINE 0.6 MG PO TABS
0.6000 mg | ORAL_TABLET | Freq: Once | ORAL | Status: AC
  Administered 2014-05-09: 0.6 mg via ORAL
  Filled 2014-05-09 (×2): qty 1

## 2014-05-09 MED ORDER — COLCHICINE 0.6 MG PO TABS
0.6000 mg | ORAL_TABLET | Freq: Every day | ORAL | Status: DC
Start: 2014-05-09 — End: 2014-05-10
  Administered 2014-05-09 – 2014-05-10 (×2): 0.6 mg via ORAL
  Filled 2014-05-09 (×3): qty 1

## 2014-05-09 NOTE — Progress Notes (Signed)
ANTICOAGULATION CONSULT NOTE - Follow Up Consult  Pharmacy Consult for Coumadin Indication: atrial fibrillation  Allergies  Allergen Reactions  . Avelox [Moxifloxacin Hcl In Nacl] Swelling, Rash and Other (See Comments)    Patient became hypotensive after infusion started Because of a history of documented adverse serious drug reaction;Medi Alert bracelet  is recommended  . Penicillins Anaphylaxis    REACTION: anaphylaxis Because of a history of documented adverse serious drug reaction;Medi Alert bracelet  is recommended    Patient Measurements: Height: 6' (182.9 cm) Weight: 242 lb 11.6 oz (110.1 kg) IBW/kg (Calculated) : 77.6 Heparin Dosing Weight: 101 kg  Vital Signs: Temp: 97.6 F (36.4 C) (02/19 0500) Temp Source: Oral (02/19 0500) BP: 144/88 mmHg (02/19 0500) Pulse Rate: 78 (02/19 0500)  Labs:  Recent Labs  05/06/14 1302 05/06/14 1827  05/07/14 0008 05/07/14 0620 05/07/14 1442 05/08/14 0456 05/09/14 0610  HGB  --   --   < > 14.4  --   --  14.8 14.4  HCT  --   --   --  42.5  --   --  44.4 42.8  PLT  --   --   --  217  --   --  253 216  LABPROT  --   --   --  20.3*  --   --  30.8* 34.6*  INR  --   --   --  1.72*  --   --  2.93* 3.41*  HEPARINUNFRC  --   --   < > 0.25* 0.27* 0.44 0.54  --   CREATININE  --   --   --  1.93*  --   --  1.38* 1.30  TROPONINI 0.12* 0.12*  --  0.11*  --   --   --   --   < > = values in this interval not displayed.  Estimated Creatinine Clearance: 64.9 mL/min (by C-G formula based on Cr of 1.3).   Assessment: Patient is a 74 y.o M on heparin and coumadin for afib.  INR increased sharply from 1.72 to 2.93 to 3.41 today. CBC is stable, no bleeding documented.  INR increase likely due to receiving several doses of Coumadin that were higher than his home dose.  Doxycycline could also be contributing.  Goal of Therapy:  INR 2-3 Heparin level 0.3-0.7 units/ml Monitor platelets by anticoagulation protocol: Yes   Plan:  - Due to sharp  jump in INR, will hold coumadin dose today. - If patient discharges today, would recommend resuming Coumadin tomorrow at previous home dose of 2.5 mg daily except 5 mg on Wed.  Uvaldo Rising, BCPS  Clinical Pharmacist Pager [phone]  05/09/2014 12:24 PM

## 2014-05-09 NOTE — Progress Notes (Signed)
[facility]                                                                              Patient Demographics  [name], is a 74 y.o. male, DOB - [birth year], OZD:[id number]  Admit date - 05/05/2014   Admitting Physician No admitting provider for patient encounter.  Outpatient Primary MD for the patient is [name] Cobble, MD  LOS - 4   Chief Complaint  Patient presents with  . Cough      Admit HPI/Interim history 74 year old male with past history of COPD, congestive heart failure EF of 25-30%, valvular disease who presented with productive cough for 4 days.  Patient admitted for acute heart failure exacerbation along with possible COPD exacerbation.  Continue Z-Pak, nebulizers, diuresis, supplemental oxygen .  Assessment & Plan   Acute respiratory failure with hypoxia -Multifactorial causes including CHF exacerbation as well as possible COPD exacerbation -Chest x-ray showed cardiomegaly without congestive failure, calcified right pleural plaque -Continue supplemental oxygen to maintain saturations above 90%, diuresis, nebulizer treatments -Patient had elevated d-dimer, 1.6, VQ scan obtained which was negative for PE  Acute on chronic systolic heart failure exacerbation -Echocardiogram: November 2015 shows an EF of 25-30% - Repeat echo pending -Continue monitor daily weights, intake and output; -948 since admission. -Initially on IV Lasix 40 mg twice a day , resume on oral Lasix after being held for a few days,  no evidence of volume overload. - Proving EF on repeat echo from 25%  TO 35%  Elevated troponin -Elevation 0.20>>0.14>>0.12>>0.12>>0.11 -Likely secondary to demand ischemia, no evidence of acute coronary syndrome -Cardiology consulted and appreciated -Continue aspirin, statin, Coreg  Possible COPD exacerbation -We'll change to Z-Pak to doxycycline giving  leukocytosis. -Sputum culture growing moderate gram-positive cocci in pairs and chains and rare  gram-negative rods(normal oropharyngeal Flora) -Patient currently not on steroids as he is currently not wheezing   Acute renal failure on CKD Stage 3 -Baseline creatinine of 1.5-1.6 -Upon admission 2.11, currently improving 1.38  Atrial fibrillation with subtherapeutic INR -Patient currently rate controlled -CHADSVASc 4 - Continue with Coreg  - INR is therapeutic today, off heparin drip   Relative hypotension -Resolved, will continue to monitor vitals closely -Amlodipine and Cozaar held  Acute urinary retention -Patient had bladder scan the emergency department showing 460 mL's of urine was retained - No recurrence   Diabetes mellitus, type II -Hemoglobin A1c 6.4 -Continue Lantus, sliding scale CBG monitoring  Hypokalemia - Repleted, recheck in a.m.  - Magnesium within normal limits .  Acute gout flare - Given 1 dose IV Solu-Medrol, started on colchicine. - Continue with allopurinol - Orthopedic consult appreciated, but this post left knee arthrocentesis with monosodium urate CrystalS, and white blood cell count of 26,000, status post bilateral knee injection of lidocaine/Depo-Medrol/Marcaine.  Code Status: Full  Family Communication: Spoke with wife at bedside  Disposition Plan: Home in a.m.  Time Spent in minutes   30 minutes  Procedures  None  Consults  Cardiology Orthopedics DVT Prophylaxis  Coumadin  Lab Results  Component Value Date   PLT 216 05/09/2014    Medications  Scheduled Meds: . acetaminophen  650 mg Oral BH-q7a  . allopurinol  100 mg Oral Daily  . aspirin EC  81 mg Oral Daily  . atorvastatin  10 mg Oral Daily  . carvedilol  6.25 mg Oral BID  . colchicine  0.6 mg Oral Daily  . colchicine  0.6 mg Oral Once  . doxycycline (VIBRAMYCIN) IV  100 mg Intravenous Q12H  . guaiFENesin  1,200 mg Oral BID  . insulin aspart  0-15 Units Subcutaneous TID WC  . insulin aspart  0-5 Units Subcutaneous QHS  . insulin detemir  30 Units Subcutaneous QHS    . methylPREDNISolone (SOLU-MEDROL) injection  80 mg Intravenous Once  . polyvinyl alcohol  2 drop Both Eyes Daily  . sodium chloride  3 mL Intravenous Q12H  . Warfarin - Pharmacist Dosing Inpatient   Does not apply q1800   Continuous Infusions:   PRN Meds:.sodium chloride, acetaminophen, albuterol, ALPRAZolam, chlorpheniramine-HYDROcodone, gabapentin, naphazoline-pheniramine, nitroGLYCERIN, ondansetron (ZOFRAN) IV, sodium chloride  Antibiotics    Anti-infectives    Start     Dose/Rate Route Frequency Ordered Stop   05/08/14 0900  doxycycline (VIBRAMYCIN) 100 mg in dextrose 5 % 250 mL IVPB     100 mg 125 mL/hr over 120 Minutes Intravenous Every 12 hours 05/08/14 0802     05/06/14 1000  azithromycin (ZITHROMAX) tablet 250 mg  Status:  Discontinued     250 mg Oral Daily 05/05/14 1819 05/08/14 0739   05/05/14 1830  azithromycin (ZITHROMAX) tablet 500 mg     500 mg Oral Daily 05/05/14 1819 05/05/14 1959      Subjective:   [name] seen and examined today.  Patient feels his breathing is improved since coming to the hospital. He denies any chest pain, abdominal pain, dizziness, headache at this time.    Objective:   Filed Vitals:   05/08/14 1435 05/08/14 2058 05/09/14 0500 05/09/14 1344  BP: 117/67 131/69 144/88 120/75  Pulse: 76 82 78 80  Temp: 97.5 F (36.4 C) 98.1 F (36.7 C) 97.6 F (36.4 C) 99.2 F (37.3 C)  TempSrc: Oral Oral Oral Oral  Resp: 18 18 19 19   Height:      Weight:   110.1 kg (242 lb 11.6 oz)   SpO2: 96% 97% 92% 97%    Wt Readings from Last 3 Encounters:  05/09/14 110.1 kg (242 lb 11.6 oz)  04/02/14 113.399 kg (250 lb)  03/31/14 114.306 kg (252 lb)     Intake/Output Summary (Last 24 hours) at 05/09/14 1649 Last data filed at 05/09/14 1400  Gross per 24 hour  Intake   1350 ml  Output    700 ml  Net    650 ml    Exam  General: Well developed, well nourished, NAD, appears stated age  41: NCAT,  mucous membranes moist.    Cardiovascular: S1 S2 auscultated, Irregular  Respiratory: Coarse breath sounds, no wheezing  Abdomen: Soft, nontender, nondistended, + bowel sounds  Extremities: warm dry without cyanosis clubbing or edema  Neuro: AAOx3, nonfocal  Psych: Normal affect and demeanor with intact judgement and insight  Data Review   Micro Results Recent Results (from the past 240 hour(s))  Urine culture     Status: None   Collection Time: 05/05/14  8:17 PM  Result Value Ref Range Status   Specimen Description URINE, CATHETERIZED  Final   Special Requests NONE  Final   Colony Count   Final    3,000 COLONIES/ML Performed at [place]   Final    ENTEROCOCCUS SPECIES  Performed at [facility]    Report Status 05/09/2014 FINAL  Final   Organism ID, Bacteria ENTEROCOCCUS SPECIES  Final      Susceptibility   Enterococcus species - MIC*    AMPICILLIN <=2 SENSITIVE Sensitive     LEVOFLOXACIN 1 SENSITIVE Sensitive     NITROFURANTOIN <=16 SENSITIVE Sensitive     VANCOMYCIN 1 SENSITIVE Sensitive     TETRACYCLINE >=16 RESISTANT Resistant     * ENTEROCOCCUS SPECIES  Culture, expectorated sputum-assessment     Status: None   Collection Time: 05/06/14  3:28 PM  Result Value Ref Range Status   Specimen Description SPUTUM  Final   Special Requests Immunocompromised  Final   Sputum evaluation   Final    THIS SPECIMEN IS ACCEPTABLE. RESPIRATORY CULTURE REPORT TO FOLLOW.   Report Status 05/06/2014 FINAL  Final  Culture, respiratory (NON-Expectorated)     Status: None   Collection Time: 05/06/14  3:28 PM  Result Value Ref Range Status   Specimen Description SPUTUM  Final   Special Requests NONE  Final   Gram Stain   Final    ABUNDANT WBC PRESENT,BOTH PMN AND MONONUCLEAR FEW SQUAMOUS EPITHELIAL CELLS PRESENT MODERATE GRAM POSITIVE COCCI IN PAIRS IN CHAINS RARE GRAM NEGATIVE RODS Performed at [facility]    Culture   Final    NORMAL OROPHARYNGEAL  FLORA Performed at [facility]    Report Status 05/08/2014 FINAL  Final    Radiology Reports Dg Chest 2 View  05/08/2014   CLINICAL DATA:  Cough for 5 days  EXAM: CHEST  2 VIEW  COMPARISON:  05/05/2014 01/01/2014, 03/15/2013  FINDINGS: Right-sided AICD in place. Spinal stimulator electrodes noted. Evidence of median sternotomy. Moderate enlargement of the cardiac silhouette is reidentified with persistent retrocardiac opacity. Calcified right pleural plaque noted. Small to moderate left pleural fluid is stable. Right lung is clear.  IMPRESSION: Stable patchy retrocardiac airspace opacity and small left pleural effusion. 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   Electronically Signed   By: Conchita Paris M.D.   On: 05/08/2014 13:06   Dg Chest 2 View  05/05/2014   CLINICAL DATA:  Cough, congestion 4 days.  No fever.  No chest pain.  EXAM: CHEST  2 VIEW  COMPARISON:  01/01/2014  FINDINGS: Mild bilateral interstitial thickening. Right basilar calcified pleural plaque. There is no focal parenchymal opacity, pleural effusion, or pneumothorax. There is stable cardiomegaly. There is evidence of prior CABG. There is a 3-lead cardiac pacer.  The osseous structures are unremarkable.  IMPRESSION: Cardiomegaly without congestive failure.  Calcified right pleural plaque as can be seen with asbestos exposure.   Electronically Signed   By: Kathreen Devoid   On: 05/05/2014 11:00   Dg Knee 1-2 Views Left  05/08/2014   CLINICAL DATA:  No injury.  Pain.  EXAM: LEFT KNEE - 1-2 VIEW  COMPARISON:  None.  FINDINGS: Peripheral vascular calcification. No acute bony or joint abnormality identified. No evidence of fracture or dislocation.  IMPRESSION: 1. No acute abnormality. 2. Peripheral vascular disease.   Electronically Signed   By: Marcello Moores  Register   On: 05/08/2014 13:10   Nm Pulmonary Perf And Vent  05/05/2014    CLINICAL DATA:  Shortness of breath.  COPD.  EXAM: NUCLEAR MEDICINE VENTILATION - PERFUSION LUNG SCAN  TECHNIQUE: Ventilation images were obtained in multiple projections using inhaled aerosol technetium 99 M  DTPA. Perfusion images were obtained in multiple projections after intravenous injection of Tc-35m MAA.  RADIOPHARMACEUTICALS:  40 mCi Tc-36m DTPA aerosol and 6 mCi Tc-80m MAA  COMPARISON:  CT chest 02/13/2010. PA and lateral views tear 2 there is a PA and lateral chest this same date.  FINDINGS: Ventilation: Ventilation to the left lung appears slightly decreased compatible with volume loss scar as seen on the prior exams.  Perfusion: No wedge shaped peripheral perfusion defects to suggest acute pulmonary embolism.  IMPRESSION: Negative for pulmonary embolus.   Electronically Signed   By: Inge Rise M.D.   On: 05/05/2014 22:40    CBC  Recent Labs Lab 05/05/14 1117 05/06/14 0246 05/07/14 0008 05/08/14 0456 05/09/14 0610  WBC 9.5 9.1 11.7* 14.3* 11.3*  HGB 14.3 13.8 14.4 14.8 14.4  HCT 41.8 40.5 42.5 44.4 42.8  PLT 176 197 217 253 216  MCV 92.7 91.0 91.8 91.5 93.2  MCH 31.7 31.0 31.1 30.5 31.4  MCHC 34.2 34.1 33.9 33.3 33.6  RDW 14.6 14.6 14.6 14.5 14.7  LYMPHSABS 0.7  --   --   --   --   MONOABS 1.0  --   --   --   --   EOSABS 0.0  --   --   --   --   BASOSABS 0.0  --   --   --   --     Chemistries   Recent Labs Lab 05/05/14 1117 05/06/14 0251 05/07/14 0008 05/08/14 0456 05/09/14 0610  NA 138 138 137 140 137  K 3.1* 2.5* 3.3* 2.6* 3.5  CL 100 97 96 100 102  CO2 29 24 28 29 25   GLUCOSE 208* 156* 115* 52* 106*  BUN 49* 49* 49* 40* 33*  CREATININE 2.11* 1.94* 1.93* 1.38* 1.30  CALCIUM 8.7 8.6 8.6 8.8 8.6  MG  --   --  2.2 2.7*  --    ------------------------------------------------------------------------------------------------------------------ estimated creatinine clearance is 64.9 mL/min (by C-G formula based on Cr of  1.3). ------------------------------------------------------------------------------------------------------------------ No results for input(s): HGBA1C in the last 72 hours. ------------------------------------------------------------------------------------------------------------------ No results for input(s): CHOL, HDL, LDLCALC, TRIG, CHOLHDL, LDLDIRECT in the last 72 hours. ------------------------------------------------------------------------------------------------------------------ No results for input(s): TSH, T4TOTAL, T3FREE, THYROIDAB in the last 72 hours.  Invalid input(s): FREET3 ------------------------------------------------------------------------------------------------------------------ No results for input(s): VITAMINB12, FOLATE, FERRITIN, TIBC, IRON, RETICCTPCT in the last 72 hours.  Coagulation profile  Recent Labs Lab 05/05/14 1117 05/06/14 0251 05/07/14 0008 05/08/14 0456 05/09/14 0610  INR 1.60* 1.69* 1.72* 2.93* 3.41*    No results for input(s): DDIMER in the last 72 hours.  Cardiac Enzymes  Recent Labs Lab 05/06/14 1302 05/06/14 1827 05/07/14 0008  TROPONINI 0.12* 0.12* 0.11*   ------------------------------------------------------------------------------------------------------------------ Invalid input(s): POCBNP    Garnette Greb MD. on 05/09/2014 at 4:49 PM  Between 7am to 7pm - Pager - [phone]  After 7pm go to [email or web address] - password [email or web address]  And look for the night coverage person covering for me after hours  [facility] Office  [phone]

## 2014-05-09 NOTE — Progress Notes (Signed)
Patient Name: [name] Date of Encounter: 05/09/2014     Principal Problem:   Acute respiratory failure with hypoxia Active Problems:   Uncontrolled diabetes mellitus with peripheral circulatory disorder   Acute gout   CKD (chronic kidney disease), stage III   Diabetic polyneuropathy   Hyperlipidemia   Atrial fibrillation   Hypoxia   Acute exacerbation of congestive heart failure   Acute renal failure   COPD exacerbation   Acute urinary retention   Diabetes    SUBJECTIVE  Denies any CP, SOB stable, however he has apparently developed Lt knee gout.  CURRENT MEDS . acetaminophen  650 mg Oral BH-q7a  . allopurinol  100 mg Oral Daily  . aspirin EC  81 mg Oral Daily  . atorvastatin  10 mg Oral Daily  . carvedilol  6.25 mg Oral BID  . doxycycline (VIBRAMYCIN) IV  100 mg Intravenous Q12H  . guaiFENesin  1,200 mg Oral BID  . insulin aspart  0-15 Units Subcutaneous TID WC  . insulin aspart  0-5 Units Subcutaneous QHS  . insulin detemir  30 Units Subcutaneous QHS  . polyvinyl alcohol  2 drop Both Eyes Daily  . sodium chloride  3 mL Intravenous Q12H  . triamcinolone acetonide  10 mg Intra-articular Once  . Warfarin - Pharmacist Dosing Inpatient   Does not apply q1800    OBJECTIVE  Filed Vitals:   05/08/14 0808 05/08/14 1435 05/08/14 2058 05/09/14 0500  BP:  117/67 131/69 144/88  Pulse:  76 82 78  Temp:  97.5 F (36.4 C) 98.1 F (36.7 C) 97.6 F (36.4 C)  TempSrc:  Oral Oral Oral  Resp:  18 18 19   Height:      Weight:    242 lb 11.6 oz (110.1 kg)  SpO2: 98% 96% 97% 92%    Intake/Output Summary (Last 24 hours) at 05/09/14 1148 Last data filed at 05/09/14 0700  Gross per 24 hour  Intake    120 ml  Output   1100 ml  Net   -980 ml   Filed Weights   05/07/14 0439 05/08/14 0432 05/09/14 0500  Weight: 243 lb 9.7 oz (110.5 kg) 243 lb 2.7 oz (110.3 kg) 242 lb 11.6 oz (110.1 kg)    PHYSICAL EXAM  General: Pleasant, NAD. Neuro: Alert and oriented X 3.  Moves all extremities spontaneously. Psych: Normal affect. HEENT:  Normal  Neck: Supple without bruits or JVD. Lungs:  Resp regular and unlabored. Largely CTA Heart: irregular.  no s3, s4, or murmurs. Abdomen: Soft, non-tender, non-distended, BS + x 4.  Extremities: No clubbing, cyanosis or edema. DP/PT/Radials 2+ and equal bilaterally.  Accessory Clinical Findings  CBC  Recent Labs  05/08/14 0456 05/09/14 0610  WBC 14.3* 11.3*  HGB 14.8 14.4  HCT 44.4 42.8  MCV 91.5 93.2  PLT 253 627   Basic Metabolic Panel  Recent Labs  05/07/14 0008 05/08/14 0456 05/09/14 0610  NA 137 140 137  K 3.3* 2.6* 3.5  CL 96 100 102  CO2 28 29 25   GLUCOSE 115* 52* 106*  BUN 49* 40* 33*  CREATININE 1.93* 1.38* 1.30  CALCIUM 8.6 8.8 8.6  MG 2.2 2.7*  --    Cardiac Enzymes  Recent Labs  05/06/14 1302 05/06/14 1827 05/07/14 0008  TROPONINI 0.12* 0.12* 0.11*    TELE A-fib with LBBB, HR 70-80s    Echocardiogram 05/08/14  Study Conclusions  - Left ventricle: The cavity size was normal. There was moderate concentric hypertrophy.  Systolic function was moderately reduced. The estimated ejection fraction was in the range of 35% to 40%. Moderate diffuse hypokinesis. There was a reduced contribution of atrial contraction to ventricular filling, due to increased ventricular diastolic pressure or atrial contractile dysfunction. Doppler parameters are consistent with a reversible restrictive pattern, indicative of decreased left ventricular diastolic compliance and/or increased left atrial pressure (grade 3 diastolic dysfunction). - Ventricular septum: Septal motion showed paradox. These changes are consistent with intraventricular conduction delay. - Aortic valve: Poorly visualized. Mildly calcified annulus. Probably trileaflet; mildly calcified leaflets. - Aorta: Aortic root dimension: 39 mm (ED). Ascending aorta diameter: 46 mm (ED). - Ascending aorta: The  ascending aorta was mildly dilated. - Mitral valve: There was mild regurgitation. - Left atrium: The atrium was severely dilated. - Right ventricle: The cavity size was mildly dilated. Wall thickness was normal    Radiology/Studies  Dg Chest 2 View  05/08/2014   CLINICAL DATA:  Cough for 5 days  EXAM: CHEST  2 VIEW  COMPARISON:  05/05/2014 01/01/2014, 03/15/2013  FINDINGS: Right-sided AICD in place. Spinal stimulator electrodes noted. Evidence of median sternotomy. Moderate enlargement of the cardiac silhouette is reidentified with persistent retrocardiac opacity. Calcified right pleural plaque noted. Small to moderate left pleural fluid is stable. Right lung is clear.  IMPRESSION: Stable patchy retrocardiac airspace opacity and small left pleural effusion. 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   Electronically Signed   By: Conchita Paris M.D.   On: 05/08/2014 13:06     ASSESSMENT AND PLAN  1. Acute respiratory insufficiency  - likely caused by respiratory issue, no sign of HF on exam.  2. chronic systolic HF  - Echo showed his EF was actually slightly improved c/w Dec 2015 echo  3. Mildly elevated trop- suspect demand ischemia 4. URI: 5. Chronic a-fib-on Coumadin  6. Acute gout   MD to see, pt's family says Ortho has been called about his knee. 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   Henri Medal PA-C Pager: [phone]   The patient was seen, examined and discussed with Kerin Ransom, PA-C and I agree with the above.   Troponin elevation - demand ischemia in the settings of acute respiratory failure and renal failure, no ischemic work up needed, no signs of CHF, restart Lasix 40 mg po daily prior to discharge. Improving respiratory status.  Incidental finding of dilated aortic root measuring 4.6 cm on echo, 4.3 the last  year, no AI, this will need further work up with CT chest for more accurate measurement (once kidney function is improved).   On chronic coumadin for chronic persistent a-fib, INR today 3.4. The hospital stay prolonged by acute gout, followed by ortho.  Dorothy Spark 05/09/2014

## 2014-05-09 NOTE — Telephone Encounter (Addendum)
04/21/14  Weight= 247 lbs. The pt continues to have right leg edema and mild SOB at this time. He did take a Chlorthalidone 12.5 mg dose today due to weight greater than 244 lbs. 04/22/14  Weight= 244 lbs. The pt continues to have right leg edema and mild SOB at this time.  04/23/14  Weight= 246 lbs.  The pt continues to have right leg edema and mild SOB at this time. He did take a Chlorthalidone 12.5 mg dose today due to weight greater than 244 lbs. 04/24/14  The pt and [place] are at the beach at this time and plan on returning home on 05/05/14. The pts wife, Joaquim Lai, states that if the pt has a weight gain of 3 lbs In a 24 hour period, excessive edema or increased SOB while they are away she will call us for recommendations. Otherwise I will call them on 05/05/14 to get the pts weights and SOB status while away. 05/05/14  According to the pts chart he is in the hospital at this time with a viralrespiratory illness that has caused an exacerbation of his congestive heart failure. [name] is aware.

## 2014-05-09 NOTE — Consult Note (Signed)
[name] Cobble, MD Chief Complaint: Left knee pain and swelling History: CTSP for bilateral knee pain L>R.  No recent trauma or injury.  Admitted 4 days ago for unrelated issue.  Now with knee pain x 48 hrs.    Past Medical History  Diagnosis Date  . Diabetes mellitus   . Hyperlipidemia   . Hypertension   . Pilonidal cyst   . Atrial fibrillation     Previous long-term amiodarone therapy with multiple cardioversions / amiodarone stopped September, 2009  . Atrial flutter     Started November, 2010, Left-sided and cannot ablate  . Left atrial thrombus     Remote past... cardioversions done since that time  . Wide-complex tachycardia   . Left ventricular ejection fraction less than 40%   . Gout   . AAA (abdominal aortic aneurysm)     Surgical repair  . Discolored skin   . S/P ICD (internal cardiac defibrillator) procedure     Dr. Lovena Le 2009... by the pacing  . SOB (shortness of breath)     Large left effusion/ thoracentesis/hospitalization/November, 2011... exudated.. cytology negative.. Dr.Wert.. no proof of mesothelioma  . Pericardial effusion   . Pleural effusion     Large loculated effusion on the left side November, 2011. This was tapped. It was exudative. Cytology revealed no cancer no proof of mesothelioma area pulmonary team felt that no further workup was needed  . S/P AAA repair   . Spinal stenosis     Surgery Dr.Elsner  . CAD (coronary artery disease)     Catheterization July, 2008... name and vein grafts patent but low cardiac output  . Warfarin anticoagulation   . Cardiomyopathy     Ischemic... ICD  . Combined systolic and diastolic CHF   . Venous insufficiency     Toe discoloration chronic  . Mitral regurgitation     Mild echo  . Aortic valve sclerosis   . Nasal drainage     Chronic  . Alcohol ingestion of more than four drinks per week     Excess beer  not a dependency problem  . Chronotropic incompetence     IV pacing rate adjusted  . Thrombophlebitis  of superficial veins of upper extremities     Possible venous stenosis from defibrillator  . Pericardial effusion     November, 2011 .. decreased during hospitalization  . Eye abnormality     Ophthalmologist questions a clot in one of his eyes, May, 2012  . Overweight(278.02)     November, 2012  . Pleural thickening   . Ejection fraction     Ejection fraction has varied over time from 35-50%.,, Echoes are technically very difficult,,, EF 50%, echo, May 25, 2011, technically very difficult  . Drug therapy     Redness and swelling with Avelox infusion May 24, 2011  . COPD (chronic obstructive pulmonary disease)   . Myocardial infarction   . Spinal cord stimulator status     October, 2013  . Carotid artery disease     Doppler, December, 2013, 0-39% bilateral  . Pneumonia   . Arthritis   . Ventral hernia     April, 2014, result of his abdominal surgery  . Bony abnormality     Patient's manubrium is slightly displaced to the right    Allergies  Allergen Reactions  . Avelox [Moxifloxacin Hcl In Nacl] Swelling, Rash and Other (See Comments)    Patient became hypotensive after infusion started Because of a history of documented adverse serious drug reaction;Medi  Alert bracelet  is recommended  . Penicillins Anaphylaxis    REACTION: anaphylaxis Because of a history of documented adverse serious drug reaction;Medi Alert bracelet  is recommended    No current facility-administered medications on file prior to encounter.   Current Outpatient Prescriptions on File Prior to Encounter  Medication Sig Dispense Refill  . ACCU-CHEK FASTCLIX LANCETS MISC USE ONE LANCET TO CHECK BLOOD SUGAR THREE TIMES DAILY 102 each 5  . allopurinol (ZYLOPRIM) 100 MG tablet Take 1 tablet (100 mg total) by mouth daily. 90 tablet 3  . aspirin 81 MG tablet Take 81 mg by mouth once a week.     Marland Kitchen atorvastatin (LIPITOR) 10 MG tablet Take 1 tablet (10 mg total) by mouth daily. 90 tablet 3  . carvedilol (COREG)  6.25 MG tablet Take 1 tablet (6.25 mg total) by mouth 2 (two) times daily. 180 tablet 1  . chlorthalidone (HYGROTON) 25 MG tablet Take 12.5 mg by mouth daily. Take when weight reaches 243 lbs. or greater only 90 tablet 3  . Cholecalciferol 10000 UNITS CAPS Take 1,000 Units by mouth daily.    . colchicine (COLCRYS) 0.6 MG tablet Take 1 tablet (0.6 mg total) by mouth as needed (for gout). 90 tablet 0  . furosemide (LASIX) 40 MG tablet Take 2 tablets (80 mg total) by mouth 2 (two) times daily. 360 tablet 1  . gabapentin (NEURONTIN) 300 MG capsule 3 x daily as needed for leg pain 270 capsule 3  . insulin lispro (HUMALOG KWIKPEN) 100 UNIT/ML KiwkPen Inject 0.12-0.14 mLs (12-14 Units total) into the skin 2 (two) times daily. 5 pen 3  . ipratropium (ATROVENT) 0.06 % nasal spray Place 1 spray into both nostrils as directed.     Marland Kitchen LEVEMIR FLEXTOUCH 100 UNIT/ML Pen INJECT 35 UNITS INTO THE SKIN EVERY NIGHT AT BEDTIME 15 mL 5  . losartan (COZAAR) 50 MG tablet Take 1 tablet (50 mg total) by mouth daily. 30 tablet 3  . metFORMIN (GLUCOPHAGE-XR) 750 MG 24 hr tablet TAKE ONE TABLET BY MOUTH ONCE DAILY WITH BREAKFAST 30 tablet 5  . naphazoline-pheniramine (NAPHCON-A) 0.025-0.3 % ophthalmic solution Place 1 drop into both eyes daily as needed for irritation.    . Potassium Chloride ER 20 MEQ TBCR Take 20 mcg by mouth 2 (two) times daily. TAKES 1 1/2 TAB = 30 MEQ.  TWICE A DAY   TOTAL IS 60 MEQ DAILY    . warfarin (COUMADIN) 2.5 MG tablet TAKE AS DIRECTED BY  ANTICOAGULATION  CLINIC (Patient taking differently: Take 2.5 mg daily except Wednesday, take 5 mg.) 130 tablet 0  . ACCU-CHEK SMARTVIEW test strip     . nitroGLYCERIN (NITROSTAT) 0.4 MG SL tablet Place 0.4 mg under the tongue every 5 (five) minutes as needed for chest pain (MAX 3 TABLETS).       Physical Exam: Filed Vitals:   05/09/14 0500  BP: 144/88  Pulse: 78  Temp: 97.6 F (36.4 C)  Resp: 19  A+O X3 NVI Compartments soft/NT EHL/TA/GA  intact Bilateral knee pain with PROM No instabilitty noted Left knee effusion noted No SOB/CP  Image: Dg Chest 2 View  05/08/2014   CLINICAL DATA:  Cough for 5 days  EXAM: CHEST  2 VIEW  COMPARISON:  05/05/2014 01/01/2014, 03/15/2013  FINDINGS: Right-sided AICD in place. Spinal stimulator electrodes noted. Evidence of median sternotomy. Moderate enlargement of the cardiac silhouette is reidentified with persistent retrocardiac opacity. Calcified right pleural plaque noted. Small to moderate left pleural fluid is  stable. Right lung is clear.  IMPRESSION: Stable patchy retrocardiac airspace opacity and small left pleural effusion. 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   Electronically Signed   By: Conchita Paris M.D.   On: 05/08/2014 13:06   Dg Chest 2 View  05/05/2014   CLINICAL DATA:  Cough, congestion 4 days.  No fever.  No chest pain.  EXAM: CHEST  2 VIEW  COMPARISON:  01/01/2014  FINDINGS: Mild bilateral interstitial thickening. Right basilar calcified pleural plaque. There is no focal parenchymal opacity, pleural effusion, or pneumothorax. There is stable cardiomegaly. There is evidence of prior CABG. There is a 3-lead cardiac pacer.  The osseous structures are unremarkable.  IMPRESSION: Cardiomegaly without congestive failure.  Calcified right pleural plaque as can be seen with asbestos exposure.   Electronically Signed   By: Kathreen Devoid   On: 05/05/2014 11:00   Dg Knee 1-2 Views Left  05/08/2014   CLINICAL DATA:  No injury.  Pain.  EXAM: LEFT KNEE - 1-2 VIEW  COMPARISON:  None.  FINDINGS: Peripheral vascular calcification. No acute bony or joint abnormality identified. No evidence of fracture or dislocation.  IMPRESSION: 1. No acute abnormality. 2. Peripheral vascular disease.   Electronically Signed   By: Marcello Moores  Register   On: 05/08/2014 13:10   Nm Pulmonary Perf And Vent  05/05/2014    CLINICAL DATA:  Shortness of breath.  COPD.  EXAM: NUCLEAR MEDICINE VENTILATION - PERFUSION LUNG SCAN  TECHNIQUE: Ventilation images were obtained in multiple projections using inhaled aerosol technetium 99 M DTPA. Perfusion images were obtained in multiple projections after intravenous injection of Tc-71m MAA.  RADIOPHARMACEUTICALS:  40 mCi Tc-36m DTPA aerosol and 6 mCi Tc-2m MAA  COMPARISON:  CT chest 02/13/2010. PA and lateral views tear 2 there is a PA and lateral chest this same date.  FINDINGS: Ventilation: Ventilation to the left lung appears slightly decreased compatible with volume loss scar as seen on the prior exams.  Perfusion: No wedge shaped peripheral perfusion defects to suggest acute pulmonary embolism.  IMPRESSION: Negative for pulmonary embolus.   Electronically Signed   By: Inge Rise M.D.   On: 05/05/2014 22:40    A/P: Patient with history of gout with bilateral knee pain consistent with gout flare. Patient without evidence of infectious process Under sterile conditions I aspirated the left knee - synovial fluid no purulent appearance  Sent for gram stain, cyrstals Aslo injected both knees with 2cc Marcaine, 2cc lidocaine, and 1 cc depo medrol.   Patient noted significant improvement after injection Recommend ice and NSAID's  Continue gout medications F/u with Dr Veverly Fells in 2 weeks for follow up. Will sign off

## 2014-05-09 NOTE — Progress Notes (Signed)
Physical Therapy Treatment Patient Details Name: [name] MRN: [id number] DOB: 11-May-1940 Today's Date: 05/09/2014    History of Present Illness 74 y.o. male admitted with acute respiratory failure with hypoxia.  Hx of afib, HTN, DM, AAA, and back surgery.    PT Comments    Patient reports improved Lt knee pain however still painful and limiting his ambulatory distance today. Educated on gentle ROM exercises which he tolerated well. Therapy session shortened due to transport arriving to take pt to test/procedure. Patient will continue to benefit from skilled physical therapy services at home with HHPT to further improve independence with functional mobility.   Follow Up Recommendations  Home health PT;Supervision for mobility/OOB     Equipment Recommendations  None recommended by PT    Recommendations for Other Services OT consult     Precautions / Restrictions Precautions Precautions: Fall Restrictions Weight Bearing Restrictions: No    Mobility  Bed Mobility Overal bed mobility: Modified Independent                Transfers Overall transfer level: Needs assistance Equipment used: Rolling walker (2 wheeled) Transfers: Sit to/from Stand Sit to Stand: Min guard         General transfer comment: Close guard for. Slow to rise due to Lt knee pain. Performed from lowest bed setting  Ambulation/Gait Ambulation/Gait assistance: Min guard Ambulation Distance (Feet): 75 Feet Assistive device: Rolling walker (2 wheeled) Gait Pattern/deviations: Step-through pattern;Decreased step length - right;Decreased stance time - left;Antalgic;Trunk flexed Gait velocity: decreased   General Gait Details: Continues to demonstrate moderatly antalgic gait pattern although states feels better since aspiration. Unable to ambulate at a distance similar to previous distances due to pain. Cues for walkerplacement for proximity intermittently and upright posture as  able.   Stairs            Wheelchair Mobility    Modified Rankin (Stroke Patients Only)       Balance                                    Cognition Arousal/Alertness: Awake/alert Behavior During Therapy: WFL for tasks assessed/performed Overall Cognitive Status: Within Functional Limits for tasks assessed                      Exercises General Exercises - Lower Extremity Ankle Circles/Pumps: AROM;Both;10 reps;Seated Quad Sets: Strengthening;Left;5 reps;Supine Long Arc Quad: Strengthening;10 reps;Seated;Left    General Comments        Pertinent Vitals/Pain Pain Assessment: 0-10 Pain Score:  ("better after the doctor drained it but still painful" ) Pain Location: LT knee Pain Descriptors / Indicators: Constant Pain Intervention(s): Monitored during session;Repositioned;Limited activity within patient's tolerance    Home Living                      Prior Function            PT Goals (current goals can now be found in the care plan section) Acute Rehab PT Goals Patient Stated Goal: Go home PT Goal Formulation: With patient Time For Goal Achievement: 05/21/14 Potential to Achieve Goals: Good Progress towards PT goals: Progressing toward goals    Frequency  Min 3X/week    PT Plan Current plan remains appropriate    Co-evaluation             End of Session Equipment Utilized During Treatment:  Gait belt Activity Tolerance: Other (comment);Patient limited by pain (Transport arrived to take for imaging procedure.) Patient left: with call bell/phone within reach;in bed     Time: 7290-2111 PT Time Calculation (min) (ACUTE ONLY): 9 min  Charges:  $Gait Training: 8-22 mins                    G Codes:      Ellouise Newer May 30, 2014, 3:29 PM Camille Bal [place], [place]

## 2014-05-10 ENCOUNTER — Encounter (HOSPITAL_COMMUNITY): Payer: Self-pay | Admitting: Cardiology

## 2014-05-10 DIAGNOSIS — I7121 Aneurysm of the ascending aorta, without rupture: Secondary | ICD-10-CM

## 2014-05-10 DIAGNOSIS — I712 Thoracic aortic aneurysm, without rupture: Secondary | ICD-10-CM

## 2014-05-10 DIAGNOSIS — I255 Ischemic cardiomyopathy: Secondary | ICD-10-CM

## 2014-05-10 LAB — GRAM STAIN: Special Requests: NORMAL

## 2014-05-10 LAB — GLUCOSE, CAPILLARY
Glucose-Capillary: 189 mg/dL — ABNORMAL HIGH (ref 70–99)
Glucose-Capillary: 190 mg/dL — ABNORMAL HIGH (ref 70–99)

## 2014-05-10 LAB — PROTIME-INR
INR: 3.05 — AB (ref 0.00–1.49)
Prothrombin Time: 31.8 seconds — ABNORMAL HIGH (ref 11.6–15.2)

## 2014-05-10 MED ORDER — GUAIFENESIN ER 600 MG PO TB12: 1200.0000 mg | ORAL_TABLET | Freq: Two times a day (BID) | ORAL | Status: DC

## 2014-05-10 MED ORDER — COLCHICINE 0.6 MG PO TABS: 0.6000 mg | ORAL_TABLET | Freq: Every day | ORAL | Status: DC

## 2014-05-10 MED ORDER — FUROSEMIDE 40 MG PO TABS: 40.0000 mg | ORAL_TABLET | Freq: Every day | ORAL | Status: DC

## 2014-05-10 MED ORDER — WARFARIN SODIUM 2.5 MG PO TABS: ORAL_TABLET | ORAL | Status: DC

## 2014-05-10 MED ORDER — POTASSIUM CHLORIDE ER 20 MEQ PO TBCR: 30.0000 ug | EXTENDED_RELEASE_TABLET | Freq: Every day | ORAL | Status: DC

## 2014-05-10 NOTE — Care Management Note (Signed)
    Page 1 of 1   05/10/2014     4:36:48 PM CARE MANAGEMENT NOTE 05/10/2014  Patient:  [name], [name]   Account Number:  [id number]  Date Initiated:  05/08/2014  Documentation initiated by:  Marvetta Gibbons  Subjective/Objective Assessment:   Pt admitted wtih COPD     Action/Plan:   PTA pt lived at home- uses cane,  PT eval  per pt he has used both [facility] and [facility] in past if [facility] needed.   Anticipated DC Date:  05/09/2014   Anticipated DC Plan:  [place]  CM consult      Choice offered to / List presented to:          [facility] arranged  Hall - 11 Patient Refused      Status of service:  Completed, signed off Medicare Important Message given?  YES (If response is "NO", the following Medicare IM given date fields will be blank) Date Medicare IM given:  05/08/2014 Medicare IM given by:  Marvetta Gibbons Date Additional Medicare IM given:   Additional Medicare IM given by:    Discharge Disposition:  HOME/SELF CARE  Per UR Regulation:  Reviewed for med. necessity/level of care/duration of stay  If discussed at [place] of Stay Meetings, dates discussed:    Comments:  05/10/14 12:00 CM spoke with [name] (wife ) of pt who states they feel they can do without the HHPT/OT.  Cm called at 16:30 after pt was home and spoke with spouse, [name] again to see if they changed their minds and she states they are fine and still feel they can do it themselves.  Appreciative of follow up call.  No other CM needs were communicated.  [name], BSN, CM [phone].

## 2014-05-10 NOTE — Discharge Summary (Signed)
[name], 74 y.o., DOB 05-22-1940, MRN [id number]. Admission date: 05/05/2014 Discharge Date 05/10/2014 Primary MD [name], MD Admitting Physician No admitting provider for patient encounter.   PCP please follow-up on: - Check CBC, BMP during next visit, patient is recovering from acute renal failure most recent creatinine is 1.3. - Follow on potassium level, patient had hypokalemia during hospital stay, maintenance dose of potassium was lowered given his Lasix dose was significantly reduced as well. - Patient will follow with [facility] this coming week ( wife report he already has scheduled appointment), instructed to hold his warfarin on 2/20, and then to resume on 2/21 on his home dose. - Patient will need to follow with cardiology Dr. Danie Binder, regarding Incidental finding of dilated aortic root measuring 4.6 cm on echo, 4.3 the last year .   Admission Diagnosis  Acute respiratory failure with hypoxia [J96.01] AKI (acute kidney injury) [N17.9]  Discharge Diagnosis   Principal Problem:   Acute respiratory failure with hypoxia Active Problems:   Uncontrolled diabetes mellitus with peripheral circulatory disorder   Acute gout   Diabetic polyneuropathy   CKD (chronic kidney disease), stage III   Hyperlipidemia   Atrial fibrillation   Hypoxia   Acute exacerbation of congestive heart failure   Acute renal failure   COPD exacerbation   Acute urinary retention   Diabetes   Ascending aortic aneurysm   Past Medical History  Diagnosis Date  . Diabetes mellitus   . Hyperlipidemia   . Hypertension   . Pilonidal cyst   . Atrial fibrillation     Previous long-term amiodarone therapy with multiple cardioversions / amiodarone stopped September, 2009  . Atrial flutter     Started November, 2010, Left-sided and cannot ablate  . Left atrial thrombus     Remote past... cardioversions done since that time  . Wide-complex tachycardia   . Left ventricular ejection  fraction less than 40%   . Gout   . AAA (abdominal aortic aneurysm)     Surgical repair  . Discolored skin   . S/P ICD (internal cardiac defibrillator) procedure     Dr. Lovena Le 2009... by the pacing  . SOB (shortness of breath)     Large left effusion/ thoracentesis/hospitalization/November, 2011... exudated.. cytology negative.. Dr.Wert.. no proof of mesothelioma  . Pericardial effusion   . Pleural effusion     Large loculated effusion on the left side November, 2011. This was tapped. It was exudative. Cytology revealed no cancer no proof of mesothelioma area pulmonary team felt that no further workup was needed  . S/P AAA repair   . Spinal stenosis     Surgery Dr.Elsner  . CAD (coronary artery disease)     Catheterization July, 2008... name and vein grafts patent but low cardiac output  . Warfarin anticoagulation   . Cardiomyopathy     Ischemic... ICD  . Combined systolic and diastolic CHF   . Venous insufficiency     Toe discoloration chronic  . Mitral regurgitation     Mild echo  . Aortic valve sclerosis   . Nasal drainage     Chronic  . Alcohol ingestion of more than four drinks per week     Excess beer  not a dependency problem  . Chronotropic incompetence     IV pacing rate adjusted  . Thrombophlebitis of superficial veins of upper extremities     Possible venous stenosis from defibrillator  . Pericardial effusion     November, 2011 .. decreased  during hospitalization  . Eye abnormality     Ophthalmologist questions a clot in one of his eyes, May, 2012  . Overweight(278.02)     November, 2012  . Pleural thickening   . Ejection fraction     Ejection fraction has varied over time from 35-50%.,, Echoes are technically very difficult,,, EF 50%, echo, May 25, 2011, technically very difficult  . Drug therapy     Redness and swelling with Avelox infusion May 24, 2011  . COPD (chronic obstructive pulmonary disease)   . Myocardial infarction   . Spinal cord stimulator  status     October, 2013  . Carotid artery disease     Doppler, December, 2013, 0-39% bilateral  . Pneumonia   . Arthritis   . Ventral hernia     April, 2014, result of his abdominal surgery  . Bony abnormality     Patient's manubrium is slightly displaced to the right  . Ascending aortic aneurysm 05/10/2014    Past Surgical History  Procedure Laterality Date  . Colonoscopy w/ polypectomy    . Lumbar fusion    . Pilonidal cyst removal    . Surgery scrotal / testicular    . Coronary artery bypass graft  2004  . Abdominal aortic aneurysm repair  11/2002  . Bi-ventricular pacemaker insertion (crt-p)  02-11-2013    Pt with previously implanted MDT CRTD downgraded to CRTP by Dr Lovena Le 02-11-13  . Back surgery    . Incision and drainage abscess / hematoma of bursa / knee / thigh    . Implantable cardioverter defibrillator (icd) generator change N/A 02/11/2013    Procedure: ICD GENERATOR CHANGE;  Surgeon: Evans Lance, MD;  Location: [facility] CATH LAB;  Service: Cardiovascular;  Laterality: N/A;     Hospital Course See H&P, Labs, Consult and Test reports for all details in brief, patient was admitted for **  Principal Problem:   Acute respiratory failure with hypoxia Active Problems:   Uncontrolled diabetes mellitus with peripheral circulatory disorder   Acute gout   Diabetic polyneuropathy   CKD (chronic kidney disease), stage III   Hyperlipidemia   Atrial fibrillation   Hypoxia   Acute exacerbation of congestive heart failure   Acute renal failure   COPD exacerbation   Acute urinary retention   Diabetes   Ascending aortic aneurysm   74 year old male with past history of COPD, congestive heart failure EF of 25-30%, valvular disease who presented with productive cough for 4 days. Patient admitted for acute heart failure exacerbation along with possible COPD exacerbation. Started on Z-Pak, transitioned to  Doxycycline, and he stated total of 5 days of antibiotics, thought to  have acute systolic CHF exacerbation, seen by cardiology , his symptoms thought to be secondary to pulmonary not cardiac issues, hospital stay was complicated by acute gout flare, and he had arthrocentesis done, showing urate crystals.  cute respiratory failure with hypoxia -Multifactorial causes including CHF exacerbation as well as possible COPD exacerbation -Chest x-ray showed cardiomegaly without congestive failure, calcified right pleural plaque -Patient had elevated d-dimer, 1.6, VQ scan obtained which was negative for PE - Resolved, currently back on room air.  Acute on chronic systolic heart failure exacerbation -Echocardiogram: November 2015 shows an EF of 25-30% -Initially on IV Lasix 40 mg twice a day ,  no evidence of volume overload, cardiology recommended to continue Lasix 40 mg oral daily on discharge. -Repeat Echo showing improving EF from 25-30% to 35-40%  Incidental finding of dilated aortic root  measuring 4.6 cm on echo, 4.3 the last year . - Will need to follow with Dr. [name] as an outpatient. this will need further work up with CT chest for more accurate measurement (once kidney function is improved).   Elevated troponin -Elevation 0.20>>0.14>>0.12>>0.12>>0.11 -Likely secondary to demand ischemia, no evidence of acute coronary syndrome -Continue aspirin, statin, Coreg  Possible COPD exacerbation -Sputum culture growing moderate gram-positive cocci in pairs and chains and rare gram-negative rods(normal oropharyngeal Flora) -Off steroids and antibiotics on discharge   Acute renal failure on CKD Stage 3 -Baseline creatinine of 1.5-1.6 -Upon admission 2.11, currently improving 1.3  Atrial fibrillation with subtherapeutic INR -Patient currently rate controlled -CHADSVASc 4 - Continue with Coreg  - INR subtherapeutic initially, was on heparin drip, INR is 3.05 on discharge  Acute gout flare - Given 1 dose IV Solu-Medrol, started on colchicine. - Continue with  allopurinol - Orthopedic consult appreciated, but this post left knee arthrocentesis with monosodium urate CrystalS, and white blood cell count of 26,000, status post bilateral knee injection of lidocaine/Depo-Medrol/Marcaine.  Acute urinary retention -Patient had bladder scan the emergency department showing 460 mL's of urine was retained - No recurrence   Diabetes mellitus, type II -Hemoglobin A1c 6.4   Hypokalemia - Repleted   Consults   Cardiology  Orthopedic  Procedures - left knee arthrocentesis with monosodium urate CrystalS, and white blood cell count of 26,000, status post bilateral knee injection of lidocaine/Depo-Medrol/Marcaine.  Significant Tests:  See full reports for all details    Dg Chest 2 View  05/08/2014   CLINICAL DATA:  Cough for 5 days  EXAM: CHEST  2 VIEW  COMPARISON:  05/05/2014 01/01/2014, 03/15/2013  FINDINGS: Right-sided AICD in place. Spinal stimulator electrodes noted. Evidence of median sternotomy. Moderate enlargement of the cardiac silhouette is reidentified with persistent retrocardiac opacity. Calcified right pleural plaque noted. Small to moderate left pleural fluid is stable. Right lung is clear.  IMPRESSION: Stable patchy retrocardiac airspace opacity and small left pleural effusion. 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   Electronically Signed   By: Conchita Paris M.D.   On: 05/08/2014 13:06   Dg Chest 2 View  05/05/2014   CLINICAL DATA:  Cough, congestion 4 days.  No fever.  No chest pain.  EXAM: CHEST  2 VIEW  COMPARISON:  01/01/2014  FINDINGS: Mild bilateral interstitial thickening. Right basilar calcified pleural plaque. There is no focal parenchymal opacity, pleural effusion, or pneumothorax. There is stable cardiomegaly. There is evidence of prior CABG. There is a 3-lead cardiac pacer.  The osseous structures are unremarkable.   IMPRESSION: Cardiomegaly without congestive failure.  Calcified right pleural plaque as can be seen with asbestos exposure.   Electronically Signed   By: Kathreen Devoid   On: 05/05/2014 11:00   Dg Knee 1-2 Views Left  05/08/2014   CLINICAL DATA:  No injury.  Pain.  EXAM: LEFT KNEE - 1-2 VIEW  COMPARISON:  None.  FINDINGS: Peripheral vascular calcification. No acute bony or joint abnormality identified. No evidence of fracture or dislocation.  IMPRESSION: 1. No acute abnormality. 2. Peripheral vascular disease.   Electronically Signed   By: Marcello Moores  Register   On: 05/08/2014 13:10   Nm Pulmonary Perf And Vent  05/05/2014   CLINICAL DATA:  Shortness of breath.  COPD.  EXAM: NUCLEAR MEDICINE VENTILATION - PERFUSION LUNG SCAN  TECHNIQUE: Ventilation images were obtained in multiple projections  using inhaled aerosol technetium 99 M DTPA. Perfusion images were obtained in multiple projections after intravenous injection of Tc-50m MAA.  RADIOPHARMACEUTICALS:  40 mCi Tc-75m DTPA aerosol and 6 mCi Tc-51m MAA  COMPARISON:  CT chest 02/13/2010. PA and lateral views tear 2 there is a PA and lateral chest this same date.  FINDINGS: Ventilation: Ventilation to the left lung appears slightly decreased compatible with volume loss scar as seen on the prior exams.  Perfusion: No wedge shaped peripheral perfusion defects to suggest acute pulmonary embolism.  IMPRESSION: Negative for pulmonary embolus.   Electronically Signed   By: Inge Rise M.D.   On: 05/05/2014 22:40     Today   Subjective:   Armas Mcbee today has no headache,no chest abdominal pain,no new weakness tingling or numbness, feels much better wants to go home today.   Objective:   Blood pressure 123/79, pulse 67, temperature 97.7 F (36.5 C), temperature source Oral, resp. rate 20, height 6' (1.829 m), weight 110.632 kg (243 lb 14.4 oz), SpO2 97 %.  Intake/Output Summary (Last 24 hours) at 05/10/14 1046 Last data filed at 05/10/14 0930   Gross per 24 hour  Intake   1230 ml  Output    700 ml  Net    530 ml    Exam Awake Alert, Oriented *3, No new F.N deficits, Normal affect [place].AT,PERRAL Supple Neck,No JVD, No cervical lymphadenopathy appriciated.  Symmetrical Chest wall movement, Good air movement bilaterally, CTAB ,No Gallops,Rubs or new Murmurs, No Parasternal Heave +ve B.Sounds, Abd Soft, Non tender, No organomegaly appriciated, No rebound -guarding or rigidity. No Cyanosis, Clubbing or edema, No new Rash or bruise  Data Review   Cultures -   CBC w Diff: Lab Results  Component Value Date   WBC 11.3* 05/09/2014   WBC 7.3 04/22/2014   HGB 14.4 05/09/2014   HGB 14.8 04/22/2014   HCT 42.8 05/09/2014   HCT 44.2 04/22/2014   PLT 216 05/09/2014   PLT 163 04/22/2014   LYMPHOPCT 8* 05/05/2014   LYMPHOPCT 19.0 04/22/2014   MONOPCT 11 05/05/2014   MONOPCT 9.9 04/22/2014   EOSPCT 0 05/05/2014   EOSPCT 1.4 04/22/2014   BASOPCT 0 05/05/2014   BASOPCT 0.4 04/22/2014   CMP: Lab Results  Component Value Date   NA 137 05/09/2014   NA 144 04/22/2014   K 3.5 05/09/2014   K 3.9 04/22/2014   CL 102 05/09/2014   CL 100 04/22/2014   CO2 25 05/09/2014   CO2 29 04/22/2014   BUN 33* 05/09/2014   BUN 30* 04/22/2014   CREATININE 1.30 05/09/2014   CREATININE 1.6* 04/22/2014   PROT 6.7 04/22/2014   PROT 6.6 03/03/2014   ALBUMIN 3.8 03/03/2014   BILITOT 1.00 04/22/2014   BILITOT 0.7 03/03/2014   ALKPHOS 55 04/22/2014   ALKPHOS 62 03/03/2014   AST 16 04/22/2014   AST 13 03/03/2014   ALT 15 04/22/2014   ALT 8 03/03/2014  .  Micro Results Recent Results (from the past 240 hour(s))  Urine culture     Status: None   Collection Time: 05/05/14  8:17 PM  Result Value Ref Range Status   Specimen Description URINE, CATHETERIZED  Final   Special Requests NONE  Final   Colony Count   Final    3,000 COLONIES/ML Performed at [facility]    Culture   Final    ENTEROCOCCUS SPECIES Performed at [facility]    Report Status 05/09/2014 FINAL  Final  Organism ID, Bacteria ENTEROCOCCUS SPECIES  Final      Susceptibility   Enterococcus species - MIC*    AMPICILLIN <=2 SENSITIVE Sensitive     LEVOFLOXACIN 1 SENSITIVE Sensitive     NITROFURANTOIN <=16 SENSITIVE Sensitive     VANCOMYCIN 1 SENSITIVE Sensitive     TETRACYCLINE >=16 RESISTANT Resistant     * ENTEROCOCCUS SPECIES  Culture, expectorated sputum-assessment     Status: None   Collection Time: 05/06/14  3:28 PM  Result Value Ref Range Status   Specimen Description SPUTUM  Final   Special Requests Immunocompromised  Final   Sputum evaluation   Final    THIS SPECIMEN IS ACCEPTABLE. RESPIRATORY CULTURE REPORT TO FOLLOW.   Report Status 05/06/2014 FINAL  Final  Culture, respiratory (NON-Expectorated)     Status: None   Collection Time: 05/06/14  3:28 PM  Result Value Ref Range Status   Specimen Description SPUTUM  Final   Special Requests NONE  Final   Gram Stain   Final    ABUNDANT WBC PRESENT,BOTH PMN AND MONONUCLEAR FEW SQUAMOUS EPITHELIAL CELLS PRESENT MODERATE GRAM POSITIVE COCCI IN PAIRS IN CHAINS RARE GRAM NEGATIVE RODS Performed at [facility]    Culture   Final    NORMAL OROPHARYNGEAL FLORA Performed at [facility]    Report Status 05/08/2014 FINAL  Final  Gram stain     Status: None   Collection Time: 05/09/14  1:14 PM  Result Value Ref Range Status   Specimen Description FLUID KNEE LEFT SYNOVIAL  Final   Special Requests Normal  Final   Gram Stain   Final    FEW WBC PRESENT,BOTH PMN AND MONONUCLEAR NO SQUAMOUS EPITHELIAL CELLS SEEN NO ORGANISMS SEEN Performed at [facility]    Report Status 05/10/2014 FINAL  Final     Discharge Instructions      Follow-up Information    Follow up with [name], MD. Schedule an appointment as soon as possible for a visit in 1 week.   Specialty:  Internal Medicine   Why:  Posthospitalization follow-up   Contact information:    [address] [address] [phone]       Follow up with Weber Cooks, MD. Schedule an appointment as soon as possible for a visit in 2 weeks.   Specialty:  Orthopedic Surgery   Why:  Gout   Contact information:   [place]  [address] [phone]       Follow up with Dola Argyle, MD. Schedule an appointment as soon as possible for a visit in 2 weeks.   Specialty:  Cardiology   Why:  Post hospitalization follow-up   Contact information:   [address] [address] [phone]       Discharge Medications     Medication List    TAKE these medications        ACCU-CHEK FASTCLIX LANCETS Misc  USE ONE LANCET TO CHECK BLOOD SUGAR THREE TIMES DAILY     ACCU-CHEK SMARTVIEW test strip  Generic drug:  glucose blood     acetaminophen 325 MG tablet  Commonly known as:  TYLENOL  Take 975 mg by mouth every morning.     allopurinol 100 MG tablet  Commonly known as:  ZYLOPRIM  Take 1 tablet (100 mg total) by mouth daily.     amLODipine 5 MG tablet  Commonly known as:  NORVASC  Take 2.5 mg by mouth daily.  aspirin 81 MG tablet  Take 81 mg by mouth once a week.     atorvastatin 10 MG tablet  Commonly known as:  LIPITOR  Take 1 tablet (10 mg total) by mouth daily.     carboxymethylcellulose 0.5 % Soln  Commonly known as:  REFRESH PLUS  2 drops daily.     carvedilol 6.25 MG tablet  Commonly known as:  COREG  Take 1 tablet (6.25 mg total) by mouth 2 (two) times daily.     chlorthalidone 25 MG tablet  Commonly known as:  HYGROTON  Take 12.5 mg by mouth daily. Take when weight reaches 243 lbs. or greater only     Cholecalciferol 10000 UNITS Caps  Take 1,000 Units by mouth daily.     colchicine 0.6 MG tablet  Take 1 tablet (0.6 mg total) by mouth daily.     furosemide 40 MG tablet  Commonly known as:  LASIX  Take 1 tablet (40 mg total) by mouth daily.     gabapentin 300 MG capsule  Commonly known  as:  NEURONTIN  3 x daily as needed for leg pain     guaiFENesin 600 MG 12 hr tablet  Commonly known as:  MUCINEX  Take 2 tablets (1,200 mg total) by mouth 2 (two) times daily.     insulin lispro 100 UNIT/ML KiwkPen  Commonly known as:  HUMALOG KWIKPEN  Inject 0.12-0.14 mLs (12-14 Units total) into the skin 2 (two) times daily.     ipratropium 0.06 % nasal spray  Commonly known as:  ATROVENT  Place 1 spray into both nostrils as directed.     LEVEMIR FLEXTOUCH 100 UNIT/ML Pen  Generic drug:  Insulin Detemir  INJECT 35 UNITS INTO THE SKIN EVERY NIGHT AT BEDTIME     losartan 50 MG tablet  Commonly known as:  COZAAR  Take 1 tablet (50 mg total) by mouth daily.     metFORMIN 750 MG 24 hr tablet  Commonly known as:  GLUCOPHAGE-XR  TAKE ONE TABLET BY MOUTH ONCE DAILY WITH BREAKFAST     naphazoline-pheniramine 0.025-0.3 % ophthalmic solution  Commonly known as:  NAPHCON-A  Place 1 drop into both eyes daily as needed for irritation.     nitroGLYCERIN 0.4 MG SL tablet  Commonly known as:  NITROSTAT  Place 0.4 mg under the tongue every 5 (five) minutes as needed for chest pain (MAX 3 TABLETS).     OVER THE COUNTER MEDICATION  Take by mouth as needed (Cough).     Potassium Chloride ER 20 MEQ Tbcr  Take 30 mcg by mouth daily.     warfarin 2.5 MG tablet  Commonly known as:  COUMADIN  Take 2.5 mg daily except Wednesday, take 5 mg.  Start taking on:  05/11/2014         Total Time in preparing paper work, data evaluation and todays exam - 35 minutes  Rollyn Scialdone M.D on 05/10/2014 at 10:46 AM  [facility] Office  [phone]

## 2014-05-10 NOTE — Progress Notes (Signed)
SUBJECTIVE:  No complaints of CP or SOB - wants to go home  OBJECTIVE:   Vitals:   Filed Vitals:   05/09/14 0500 05/09/14 1344 05/09/14 2114 05/10/14 0438  BP: 144/88 120/75 132/76 123/79  Pulse: 78 80 66 67  Temp: 97.6 F (36.4 C) 99.2 F (37.3 C) 98.2 F (36.8 C) 97.7 F (36.5 C)  TempSrc: Oral Oral Oral Oral  Resp: 19 19 18 20   Height:      Weight: 242 lb 11.6 oz (110.1 kg)   243 lb 14.4 oz (110.632 kg)  SpO2: 92% 97% 95% 97%   I&O's:   Intake/Output Summary (Last 24 hours) at 05/10/14 0829 Last data filed at 05/10/14 0445  Gross per 24 hour  Intake   1230 ml  Output    700 ml  Net    530 ml   TELEMETRY: Reviewed telemetry pt in atrial fibrillation with CVR     PHYSICAL EXAM General: Well developed, well nourished, in no acute distress Head: Eyes PERRLA, No xanthomas.   Normal cephalic and atramatic  Lungs:   Clear bilaterally to auscultation and percussion. Heart:   Irregularly irregular S1 S2 Pulses are 2+ & equal. Abdomen: Bowel sounds are positive, abdomen soft and non-tender without masses Extremities:   No clubbing, cyanosis or edema.  DP +1 Neuro: Alert and oriented X 3. Psych:  Good affect, responds appropriately   LABS: Basic Metabolic Panel:  Recent Labs  05/08/14 0456 05/09/14 0610  NA 140 137  K 2.6* 3.5  CL 100 102  CO2 29 25  GLUCOSE 52* 106*  BUN 40* 33*  CREATININE 1.38* 1.30  CALCIUM 8.8 8.6  MG 2.7*  --    Liver Function Tests: No results for input(s): AST, ALT, ALKPHOS, BILITOT, PROT, ALBUMIN in the last 72 hours. No results for input(s): LIPASE, AMYLASE in the last 72 hours. CBC:  Recent Labs  05/08/14 0456 05/09/14 0610  WBC 14.3* 11.3*  HGB 14.8 14.4  HCT 44.4 42.8  MCV 91.5 93.2  PLT 253 216   Cardiac Enzymes: No results for input(s): CKTOTAL, CKMB, CKMBINDEX, TROPONINI in the last 72 hours. BNP: Invalid input(s): POCBNP D-Dimer: No results for input(s): DDIMER in the last 72 hours. Hemoglobin A1C: No  results for input(s): HGBA1C in the last 72 hours. Fasting Lipid Panel: No results for input(s): CHOL, HDL, LDLCALC, TRIG, CHOLHDL, LDLDIRECT in the last 72 hours. Thyroid Function Tests: No results for input(s): TSH, T4TOTAL, T3FREE, THYROIDAB in the last 72 hours.  Invalid input(s): FREET3 Anemia Panel: No results for input(s): VITAMINB12, FOLATE, FERRITIN, TIBC, IRON, RETICCTPCT in the last 72 hours. Coag Panel:   Lab Results  Component Value Date   INR 3.05* 05/10/2014   INR 3.41* 05/09/2014   INR 2.93* 05/08/2014   PROTIME 16.7 09/08/2008   PROTIME 18.5 08/25/2008    RADIOLOGY: Dg Chest 2 View  05/08/2014   CLINICAL DATA:  Cough for 5 days  EXAM: CHEST  2 VIEW  COMPARISON:  05/05/2014 01/01/2014, 03/15/2013  FINDINGS: Right-sided AICD in place. Spinal stimulator electrodes noted. Evidence of median sternotomy. Moderate enlargement of the cardiac silhouette is reidentified with persistent retrocardiac opacity. Calcified right pleural plaque noted. Small to moderate left pleural fluid is stable. Right lung is clear.  IMPRESSION: Stable patchy retrocardiac airspace opacity and small left pleural effusion. 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   Electronically Signed  By: Conchita Paris M.D.   On: 05/08/2014 13:06   Dg Chest 2 View  05/05/2014   CLINICAL DATA:  Cough, congestion 4 days.  No fever.  No chest pain.  EXAM: CHEST  2 VIEW  COMPARISON:  01/01/2014  FINDINGS: Mild bilateral interstitial thickening. Right basilar calcified pleural plaque. There is no focal parenchymal opacity, pleural effusion, or pneumothorax. There is stable cardiomegaly. There is evidence of prior CABG. There is a 3-lead cardiac pacer.  The osseous structures are unremarkable.  IMPRESSION: Cardiomegaly without congestive failure.  Calcified right pleural plaque as can be seen with asbestos exposure.    Electronically Signed   By: Kathreen Devoid   On: 05/05/2014 11:00   Dg Knee 1-2 Views Left  05/08/2014   CLINICAL DATA:  No injury.  Pain.  EXAM: LEFT KNEE - 1-2 VIEW  COMPARISON:  None.  FINDINGS: Peripheral vascular calcification. No acute bony or joint abnormality identified. No evidence of fracture or dislocation.  IMPRESSION: 1. No acute abnormality. 2. Peripheral vascular disease.   Electronically Signed   By: Marcello Moores  Register   On: 05/08/2014 13:10   Nm Pulmonary Perf And Vent  05/05/2014   CLINICAL DATA:  Shortness of breath.  COPD.  EXAM: NUCLEAR MEDICINE VENTILATION - PERFUSION LUNG SCAN  TECHNIQUE: Ventilation images were obtained in multiple projections using inhaled aerosol technetium 99 M DTPA. Perfusion images were obtained in multiple projections after intravenous injection of Tc-18m MAA.  RADIOPHARMACEUTICALS:  40 mCi Tc-12m DTPA aerosol and 6 mCi Tc-34m MAA  COMPARISON:  CT chest 02/13/2010. PA and lateral views tear 2 there is a PA and lateral chest this same date.  FINDINGS: Ventilation: Ventilation to the left lung appears slightly decreased compatible with volume loss scar as seen on the prior exams.  Perfusion: No wedge shaped peripheral perfusion defects to suggest acute pulmonary embolism.  IMPRESSION: Negative for pulmonary embolus.   Electronically Signed   By: Inge Rise M.D.   On: 05/05/2014 22:40    ASSESSMENT AND PLAN  1. Acute respiratory insufficiency - likely caused by respiratory issue, no sign of HF on exam.  2. chronic systolic HF - Echo showed his EF was actually slightly improved c/w Dec 2015 echo.  PO Lasix restarted 3. Mildly elevated trop- demand ischemia in the settings of acute respiratory failure and renal failure, no ischemic work up needed, no signs of CHF, restart Lasix 40 mg po daily prior to discharge. Improving respiratory status. 4. URI: 5. Chronic a-fib-on Coumadin  6. Acute gout 7.  Incidental finding of dilated  aortic root measuring 4.6 cm on echo, 4.3 the last year, no AI, this will need further work up with CT chest for more accurate measurement (once kidney function is improved). This can be done as an outpt.  Please have patient followup with Dr. [name] in the office to followup on this if he goes home today.  No other recs at this time.  Will sign off.  Call with any questions.   Sueanne Margarita, MD  05/10/2014  8:29 AM

## 2014-05-10 NOTE — Progress Notes (Signed)
Pt D/C home.  Alert and oriented x4. No c/o pain. Education given to pt and wife on diet, activity, meds, and follow-up care and appointments.  Pt verbalized understanding.  IV D/Cd.  Tele D/Cd.

## 2014-05-12 ENCOUNTER — Telehealth: Payer: Self-pay | Admitting: *Deleted

## 2014-05-12 NOTE — Telephone Encounter (Signed)
Transition Care Management Follow-up Telephone Call D/C 05/10/14  How have you been since you were released from the hospital? Wife states he is doing ok   Do you understand why you were in the hospital? YES   Do you understand the discharge instrcutions? YES  Items Reviewed:  Medications reviewed: YES  Allergies reviewed: YES  Dietary changes reviewed: No changes  Referrals reviewed: No referral needed   Functional Questionnaire:   Activities of Daily Living (ADLs):   Wife states he are independent in the following: ambulation, bathing and hygiene, feeding, continence, toileting and dressing She states he doesn't require assistance    Any transportation issues/concerns?: NO   Any patient concern: NO   Confirmed importance and date/time of follow-up visits scheduled: YES, pt had already made hosp f/u appt for 05/27/14 w/Dr. Linna Darner advise them to keep appt   Confirmed with patient if condition begins to worsen call PCP or go to the ER.

## 2014-05-13 ENCOUNTER — Other Ambulatory Visit: Payer: Self-pay

## 2014-05-13 MED ORDER — CHLORTHALIDONE 25 MG PO TABS: ORAL_TABLET | ORAL | Status: DC

## 2014-05-14 ENCOUNTER — Ambulatory Visit (INDEPENDENT_AMBULATORY_CARE_PROVIDER_SITE_OTHER): Payer: Medicare Other | Admitting: *Deleted

## 2014-05-14 ENCOUNTER — Encounter: Payer: Self-pay | Admitting: Nurse Practitioner

## 2014-05-14 ENCOUNTER — Ambulatory Visit (INDEPENDENT_AMBULATORY_CARE_PROVIDER_SITE_OTHER): Payer: Medicare Other | Admitting: Nurse Practitioner

## 2014-05-14 VITALS — BP 118/68 | HR 80 | Ht 72.0 in | Wt 248.0 lb

## 2014-05-14 DIAGNOSIS — R0600 Dyspnea, unspecified: Secondary | ICD-10-CM

## 2014-05-14 DIAGNOSIS — D751 Secondary polycythemia: Secondary | ICD-10-CM

## 2014-05-14 DIAGNOSIS — I482 Chronic atrial fibrillation, unspecified: Secondary | ICD-10-CM

## 2014-05-14 DIAGNOSIS — I255 Ischemic cardiomyopathy: Secondary | ICD-10-CM

## 2014-05-14 DIAGNOSIS — R0602 Shortness of breath: Secondary | ICD-10-CM | POA: Diagnosis not present

## 2014-05-14 DIAGNOSIS — Z7901 Long term (current) use of anticoagulants: Secondary | ICD-10-CM | POA: Diagnosis not present

## 2014-05-14 LAB — BASIC METABOLIC PANEL
BUN: 28 mg/dL — ABNORMAL HIGH (ref 6–23)
CO2: 25 mEq/L (ref 19–32)
Calcium: 8.6 mg/dL (ref 8.4–10.5)
Chloride: 109 mEq/L (ref 96–112)
Creatinine, Ser: 1.28 mg/dL (ref 0.40–1.50)
GFR: 58.54 mL/min — ABNORMAL LOW (ref >60.00)
Glucose, Bld: 126 mg/dL — ABNORMAL HIGH (ref 70–99)
Potassium: 4.3 mEq/L (ref 3.5–5.1)
Sodium: 140 mEq/L (ref 135–145)

## 2014-05-14 LAB — CBC
HCT: 46.2 % (ref 39.0–52.0)
Hemoglobin: 15.4 g/dL (ref 13.0–17.0)
MCHC: 33.3 g/dL (ref 30.0–36.0)
MCV: 92.3 fl (ref 78.0–100.0)
Platelets: 299 10*3/uL (ref 150.0–400.0)
RBC: 5 Mil/uL (ref 4.22–5.81)
RDW: 15.8 % — ABNORMAL HIGH (ref 11.5–15.5)
WBC: 10.3 10*3/uL (ref 4.0–10.5)

## 2014-05-14 LAB — POCT INR: INR: 2.5

## 2014-05-14 LAB — BRAIN NATRIURETIC PEPTIDE: Pro B Natriuretic peptide (BNP): 670 pg/mL — ABNORMAL HIGH (ref 0.0–100.0)

## 2014-05-14 MED ORDER — POTASSIUM CHLORIDE ER 20 MEQ PO TBCR: 30.0000 ug | EXTENDED_RELEASE_TABLET | Freq: Two times a day (BID) | ORAL | Status: DC

## 2014-05-14 MED ORDER — FUROSEMIDE 80 MG PO TABS: 80.0000 mg | ORAL_TABLET | Freq: Two times a day (BID) | ORAL | Status: DC

## 2014-05-14 NOTE — Progress Notes (Signed)
CARDIOLOGY OFFICE NOTE  Date:  05/14/2014    [name] Date of Birth: 07-Oct-1940 Medical Record #[id number]  PCP:  [name], MD  Cardiologist:  Earley Brooke   Chief Complaint  Patient presents with  . Congestive Heart Failure    Post hospital visit - seen for Dr. [name] & Lovena Le     History of Present Illness: [name] is a 74 y.o. male who presents today for a post hospital visit. Seen for Dr. [name]. He has a complex past medical history. This includes DM, HLD, HTN, atrial fib, chronic anticoagulation, AAA repair, underlying ICD, wide complex tachycardia, ischemic CM with CABG x 4 back in 2004 by Dr. Cyndia Bent, gout, COPD and enlarged ascending aorta. Looks like cath was in 2008 - grafts patent.   Last seen here in January.   Admitted earlier this month. Discharged just 4 days ago - had respiratory insufficiency (wife had been sick with bronchitis). Has chronic systolic HF - EF from December 35 to 40%. Sent home on low dose Lasix - not his usual dose - usually 160 mg total a day.On less potassium as well.  Incidental finding of dilated aortic root measuring 4.6 cm (was 4.3 last year). Has been recommended to have CT once kidney function improves.   Comes in today. Here with his wife. He has done ok but over the past 24 hours, he has gotten more short of breath. Weight is up by our scales, not by his at home. No chest pain. Some PND last night. Trace edema. Not dizzy. Still with some cough but better. Wants to try and travel to the beach this weekend. Does a good job with restricting salt and weights daily. Past Medical History  Diagnosis Date  . Diabetes mellitus   . Hyperlipidemia   . Hypertension   . Pilonidal cyst   . Atrial fibrillation     Previous long-term amiodarone therapy with multiple cardioversions / amiodarone stopped September, 2009  . Atrial flutter     Started November, 2010, Left-sided and cannot ablate  . Left atrial thrombus     Remote  past... cardioversions done since that time  . Wide-complex tachycardia   . Left ventricular ejection fraction less than 40%   . Gout   . AAA (abdominal aortic aneurysm)     Surgical repair  . Discolored skin   . S/P ICD (internal cardiac defibrillator) procedure     Dr. Lovena Le 2009... by the pacing  . SOB (shortness of breath)     Large left effusion/ thoracentesis/hospitalization/November, 2011... exudated.. cytology negative.. Dr.Wert.. no proof of mesothelioma  . Pericardial effusion   . Pleural effusion     Large loculated effusion on the left side November, 2011. This was tapped. It was exudative. Cytology revealed no cancer no proof of mesothelioma area pulmonary team felt that no further workup was needed  . S/P AAA repair   . Spinal stenosis     Surgery Dr.Elsner  . CAD (coronary artery disease)     Catheterization July, 2008... name and vein grafts patent but low cardiac output  . Warfarin anticoagulation   . Cardiomyopathy     Ischemic... ICD  . Combined systolic and diastolic CHF   . Venous insufficiency     Toe discoloration chronic  . Mitral regurgitation     Mild echo  . Aortic valve sclerosis   . Nasal drainage     Chronic  . Alcohol ingestion of more than  four drinks per week     Excess beer  not a dependency problem  . Chronotropic incompetence     IV pacing rate adjusted  . Thrombophlebitis of superficial veins of upper extremities     Possible venous stenosis from defibrillator  . Pericardial effusion     November, 2011 .. decreased during hospitalization  . Eye abnormality     Ophthalmologist questions a clot in one of his eyes, May, 2012  . Overweight(278.02)     November, 2012  . Pleural thickening   . Ejection fraction     Ejection fraction has varied over time from 35-50%.,, Echoes are technically very difficult,,, EF 50%, echo, May 25, 2011, technically very difficult  . Drug therapy     Redness and swelling with Avelox infusion May 24, 2011    . COPD (chronic obstructive pulmonary disease)   . Myocardial infarction   . Spinal cord stimulator status     October, 2013  . Carotid artery disease     Doppler, December, 2013, 0-39% bilateral  . Pneumonia   . Arthritis   . Ventral hernia     April, 2014, result of his abdominal surgery  . Bony abnormality     Patient's manubrium is slightly displaced to the right  . Ascending aortic aneurysm 05/10/2014    Past Surgical History  Procedure Laterality Date  . Colonoscopy w/ polypectomy    . Lumbar fusion    . Pilonidal cyst removal    . Surgery scrotal / testicular    . Coronary artery bypass graft  2004  . Abdominal aortic aneurysm repair  11/2002  . Bi-ventricular pacemaker insertion (crt-p)  02-11-2013    Pt with previously implanted MDT CRTD downgraded to CRTP by Dr Lovena Le 02-11-13  . Back surgery    . Incision and drainage abscess / hematoma of bursa / knee / thigh    . Implantable cardioverter defibrillator (icd) generator change N/A 02/11/2013    Procedure: ICD GENERATOR CHANGE;  Surgeon: Evans Lance, MD;  Location: [facility] CATH LAB;  Service: Cardiovascular;  Laterality: N/A;     Medications: Current Outpatient Prescriptions  Medication Sig Dispense Refill  . ACCU-CHEK FASTCLIX LANCETS MISC USE ONE LANCET TO CHECK BLOOD SUGAR THREE TIMES DAILY 102 each 5  . ACCU-CHEK SMARTVIEW test strip     . acetaminophen (TYLENOL) 325 MG tablet Take 975 mg by mouth every morning.    Marland Kitchen allopurinol (ZYLOPRIM) 100 MG tablet Take 1 tablet (100 mg total) by mouth daily. 90 tablet 3  . amLODipine (NORVASC) 5 MG tablet Take 2.5 mg by mouth daily.    Marland Kitchen aspirin 81 MG tablet Take 81 mg by mouth once a week.     Marland Kitchen atorvastatin (LIPITOR) 10 MG tablet Take 1 tablet (10 mg total) by mouth daily. 90 tablet 3  . carboxymethylcellulose (REFRESH PLUS) 0.5 % SOLN 2 drops daily.    . carvedilol (COREG) 6.25 MG tablet Take 1 tablet (6.25 mg total) by mouth 2 (two) times daily. 180 tablet 1  .  chlorthalidone (HYGROTON) 25 MG tablet Take when weight reaches 244 lbs. or greater only 90 tablet 3  . Cholecalciferol 10000 UNITS CAPS Take 1,000 Units by mouth daily.    . colchicine 0.6 MG tablet Take 1 tablet (0.6 mg total) by mouth daily. 10 tablet 0  . furosemide (LASIX) 40 MG tablet Take 1 tablet (40 mg total) by mouth daily. 360 tablet 1  . gabapentin (NEURONTIN) 300 MG capsule 3  x daily as needed for leg pain 270 capsule 3  . insulin lispro (HUMALOG KWIKPEN) 100 UNIT/ML KiwkPen Inject 0.12-0.14 mLs (12-14 Units total) into the skin 2 (two) times daily. 5 pen 3  . ipratropium (ATROVENT) 0.06 % nasal spray Place 1 spray into both nostrils as directed.     Marland Kitchen LEVEMIR FLEXTOUCH 100 UNIT/ML Pen INJECT 35 UNITS INTO THE SKIN EVERY NIGHT AT BEDTIME 15 mL 5  . losartan (COZAAR) 50 MG tablet Take 1 tablet (50 mg total) by mouth daily. 30 tablet 3  . metFORMIN (GLUCOPHAGE-XR) 750 MG 24 hr tablet TAKE ONE TABLET BY MOUTH ONCE DAILY WITH BREAKFAST 30 tablet 5  . naphazoline-pheniramine (NAPHCON-A) 0.025-0.3 % ophthalmic solution Place 1 drop into both eyes daily as needed for irritation.    . nitroGLYCERIN (NITROSTAT) 0.4 MG SL tablet Place 0.4 mg under the tongue every 5 (five) minutes as needed for chest pain (MAX 3 TABLETS).     Marland Kitchen OVER THE COUNTER MEDICATION Take by mouth as needed (Cough).    . Potassium Chloride ER 20 MEQ TBCR Take 30 mcg by mouth daily. 60 tablet   . warfarin (COUMADIN) 2.5 MG tablet Take 2.5 mg daily except Wednesday, take 5 mg. 130 tablet 0   No current facility-administered medications for this visit.    Allergies: Allergies  Allergen Reactions  . Avelox [Moxifloxacin Hcl In Nacl] Swelling, Rash and Other (See Comments)    Patient became hypotensive after infusion started Because of a history of documented adverse serious drug reaction;Medi Alert bracelet  is recommended  . Penicillins Anaphylaxis    REACTION: anaphylaxis Because of a history of documented adverse  serious drug reaction;Medi Alert bracelet  is recommended    Social History: The patient  reports that he quit smoking about 19 years ago. His smoking use included Cigarettes. He started smoking about 58 years ago. He has a 117 pack-year smoking history. He has never used smokeless tobacco. He reports that he drinks about 1.2 oz of alcohol per week. He reports that he does not use illicit drugs.   Family History: The patient's family history includes Coronary artery disease in his father; Diabetes in his father; Hypertension in his mother; Other in his father; Stroke in his mother.   Review of Systems: Please see the history of present illness.   Otherwise, the review of systems is positive for dyspnea. Some PND last night.    All other systems are reviewed and negative.   Physical Exam: VS:  BP 118/68 mmHg  Pulse 80  Ht 6' (1.829 m)  Wt 248 lb (112.492 kg)  BMI 33.63 kg/m2  SpO2 92% .  BMI Body mass index is 33.63 kg/(m^2).  Wt Readings from Last 3 Encounters:  05/14/14 248 lb (112.492 kg)  05/10/14 243 lb 14.4 oz (110.632 kg)  04/02/14 250 lb (113.399 kg)    General: Pleasant. Well developed, well nourished and in no acute distress.  HEENT: Normal. Neck: Supple, no JVD, carotid bruits, or masses noted.  Cardiac: Irregular irregular rhythm. Rate is ok. No murmurs, rubs, or gallops. Trace edema.  Respiratory:  Lungs have rales 1/3 bilaterally with normal work of breathing.  GI: Soft and nontender.  MS: No deformity or atrophy. Gait and ROM intact. Skin: Warm and dry. Color is normal.  Neuro:  Strength and sensation are intact and no gross focal deficits noted.  Psych: Alert, appropriate and with normal affect.   LABORATORY DATA:  EKG:  EKG is not ordered today.  Lab Results  Component Value Date   WBC 11.3* 05/09/2014   HGB 14.4 05/09/2014   HCT 42.8 05/09/2014   PLT 216 05/09/2014   GLUCOSE 106* 05/09/2014   CHOL 172 03/03/2014   TRIG 106.0 03/03/2014   HDL 42.20  03/03/2014   LDLCALC 109* 03/03/2014   ALT 15 04/22/2014   AST 16 04/22/2014   NA 137 05/09/2014   K 3.5 05/09/2014   CL 102 05/09/2014   CREATININE 1.30 05/09/2014   BUN 33* 05/09/2014   CO2 25 05/09/2014   TSH 0.69 07/08/2013   PSA 0.33 01/07/2010   INR 2.5 05/14/2014   HGBA1C 6.4* 05/05/2014   MICROALBUR 0.5 02/01/2013    BNP (last 3 results)  Recent Labs  05/05/14 1117  BNP 607.3*    ProBNP (last 3 results) No results for input(s): PROBNP in the last 8760 hours.   Other Studies Reviewed Today:  Echo Study Conclusions from 02/2014  - Left ventricle: The cavity size was normal. There was moderate concentric hypertrophy. Systolic function was moderately reduced. The estimated ejection fraction was in the range of 35% to 40%. Moderate diffuse hypokinesis. There was a reduced contribution of atrial contraction to ventricular filling, due to increased ventricular diastolic pressure or atrial contractile dysfunction. Doppler parameters are consistent with a reversible restrictive pattern, indicative of decreased left ventricular diastolic compliance and/or increased left atrial pressure (grade 3 diastolic dysfunction). - Ventricular septum: Septal motion showed paradox. These changes are consistent with intraventricular conduction delay. - Aortic valve: Poorly visualized. Mildly calcified annulus. Probably trileaflet; mildly calcified leaflets. - Aorta: Aortic root dimension: 39 mm (ED). Ascending aorta diameter: 46 mm (ED). - Ascending aorta: The ascending aorta was mildly dilated. - Mitral valve: There was mild regurgitation. - Left atrium: The atrium was severely dilated. - Right ventricle: The cavity size was mildly dilated. Wall thickness was normal.  Assessment/Plan: 1. Chronic systolic and diastolic heart failure - little more volume overload. Would favor getting back on usual dose of diuretics and potassium. [place] lab  today.  2. URI - resolving  3. Ischemic CM  4. Underlying ICD  5. Chronic atrial fib - managed with rate control and anticoagulation  6. Ascending aortic aneurysm - may need to consider CT scanning upon return. Patient and his wife have been made aware of this finding.   Current medicines are reviewed with the patient today.  The patient does not have concerns regarding medicines other than what has been noted above.  The following changes have been made:  See above.  Labs/ tests ordered today include:   No orders of the defined types were placed in this encounter.     Disposition:   FU with Dr. [name] in 1 month  Patient is agreeable to this plan and will call if any problems develop in the interim.   Signed: Burtis Junes, RN, ANP-C 05/14/2014 3:09 PM  [facility] [address] [place], [place]  [address] Phone: [phone] Fax: [phone]

## 2014-05-14 NOTE — Patient Instructions (Signed)
We will be checking the following labs today BMET, BNP and CBC  Increase lasix back to 80 mg twice a day  Increase potassium back to 30 meq twice a day  [name] will be in touch with you early next week   Call us if your symptoms do not improve  Keep weighing daily and restricting your salt  Call the [place] office at [phone] if you have any questions, problems or concerns.

## 2014-05-15 ENCOUNTER — Other Ambulatory Visit: Payer: Self-pay

## 2014-05-15 DIAGNOSIS — I159 Secondary hypertension, unspecified: Secondary | ICD-10-CM

## 2014-05-16 ENCOUNTER — Telehealth: Payer: Self-pay

## 2014-05-16 NOTE — Telephone Encounter (Signed)
Pt's wife advised of note below and voiced understanding. She will call on Monday if his sugars are still running low.

## 2014-05-16 NOTE — Telephone Encounter (Signed)
Pt called stating his sugar keeps dropping every am in the 60's and they are out of town , pt wanting to know what to do please call back at [phone].  Please advise, Thanks!

## 2014-05-16 NOTE — Telephone Encounter (Signed)
Reduce Levemir from 35 down to 30 units

## 2014-05-16 NOTE — Telephone Encounter (Signed)
Pt sugar keeps dropping every am to the 60 and they are out of town , pt wantign to know what to do please call back at [phone]

## 2014-05-16 NOTE — Telephone Encounter (Signed)
Note below was attached to this message in error.

## 2014-05-22 ENCOUNTER — Telehealth: Payer: Self-pay

## 2014-05-22 NOTE — Telephone Encounter (Signed)
05/11/14  Weight= 241 lbs.  Joaquim Lai, the pts wife, states that the pt has mild edema in his right leg and no SOB at this time. 05/12/14  Weight= 240 lbs.  Joaquim Lai states that the pt has edema in both feet and no SOB at this time. Per [name] the pt needs to be seen sooner for his EPH f/u. Appointment scheduled for 2/24 with Truitt Merle, NP. 05/13/14  Weight= 242 lbs.  Joaquim Lai states that the pt has increased swelling in his feet and mild SOB at this time.   05/14/14  The  Pt had OV with Truitt Merle, NP today. Lori increased the pts Furosemide back up to 80 mg BID and Potassium back up to 30 meq BID. See OV notes from 2/24.

## 2014-05-25 ENCOUNTER — Other Ambulatory Visit: Payer: Self-pay | Admitting: Cardiology

## 2014-05-27 ENCOUNTER — Ambulatory Visit: Payer: Medicare Other | Admitting: Internal Medicine

## 2014-06-02 ENCOUNTER — Encounter: Payer: Self-pay | Admitting: Internal Medicine

## 2014-06-02 ENCOUNTER — Ambulatory Visit (INDEPENDENT_AMBULATORY_CARE_PROVIDER_SITE_OTHER): Payer: Medicare Other | Admitting: Internal Medicine

## 2014-06-02 ENCOUNTER — Other Ambulatory Visit (INDEPENDENT_AMBULATORY_CARE_PROVIDER_SITE_OTHER): Payer: Medicare Other

## 2014-06-02 VITALS — BP 100/80 | HR 76 | Temp 97.5°F | Resp 14 | Ht 72.0 in | Wt 247.5 lb

## 2014-06-02 DIAGNOSIS — N179 Acute kidney failure, unspecified: Secondary | ICD-10-CM | POA: Diagnosis not present

## 2014-06-02 DIAGNOSIS — E119 Type 2 diabetes mellitus without complications: Secondary | ICD-10-CM | POA: Diagnosis not present

## 2014-06-02 DIAGNOSIS — J9601 Acute respiratory failure with hypoxia: Secondary | ICD-10-CM

## 2014-06-02 DIAGNOSIS — S81802A Unspecified open wound, left lower leg, initial encounter: Secondary | ICD-10-CM | POA: Diagnosis not present

## 2014-06-02 DIAGNOSIS — Z23 Encounter for immunization: Secondary | ICD-10-CM

## 2014-06-02 LAB — BASIC METABOLIC PANEL
BUN: 24 mg/dL — ABNORMAL HIGH (ref 6–23)
CALCIUM: 9 mg/dL (ref 8.4–10.5)
CHLORIDE: 107 meq/L (ref 96–112)
CO2: 28 mEq/L (ref 19–32)
CREATININE: 1.56 mg/dL — AB (ref 0.40–1.50)
GFR: 46.58 mL/min — ABNORMAL LOW (ref >60.00)
Glucose, Bld: 99 mg/dL (ref 70–99)
Potassium: 4.3 mEq/L (ref 3.5–5.1)
Sodium: 140 mEq/L (ref 135–145)

## 2014-06-02 NOTE — Progress Notes (Signed)
Pre visit review using our clinic review tool, if applicable. No additional management support is needed unless otherwise documented below in the visit note. 

## 2014-06-02 NOTE — Progress Notes (Signed)
   Subjective:    Patient ID: [name], male    DOB: [birth year], 74 y.o.   MRN: [id number]  HPI The hospital records 2/15-2/20/16 were reviewed. He was admitted with acute hypoxic resp insufficiency in context of acute bronchitis.  This was complicated by acute renal insufficiency. He was felt to also have components of COPD exacerbation. Congestive heart failure was questioned although chest x-ray did show CE w/o fulminant failure  The time of discharge it was recommended that he have follow-up chemistries and CBC. These were actually completed in the cardiology office 2/24. These were reviewed. At the time of discharge creatinine was 1.3; on 2/24 creatinine was 1.28. He exhibited no anemia. Cardiology follow-up with CT once renal status stable  because his aortic root diameter has increased from 4.3 cm up to 4.6 since last year.  At this time he has slight exertional dyspnea which he states is not unusual for him. He feels he is back to his baseline.  He has no active cardiopulmonary, upper respiratory tract resp infection symptoms, or extrinsic symptoms.   Review of Systems  Chest pain, palpitations, tachycardia, paroxysmal nocturnal dyspnea, claudication or edema are absent.  Frontal headache, facial pain , nasal purulence, dental pain, sore throat , otic pain or otic discharge denied. No fever , chills or sweats.Extrinsic symptoms of itchy, watery eyes, sneezing, or angioedema are denied. There is no significant cough, sputum production, wheezing,or  paroxysmal nocturnal dyspnea.    Objective:   Physical Exam Pertinent positive findings include: Pattern alopecia is present. He has bilateral hearing aids.  He wears complete dentures.  Heart sounds are markedly distant and irregular.  Breath sounds are also decreased.  Abdomen is massive with a massive ventral hernia. Pulses are decreased. He has trace edema  He sustained an abrasion of the L shin getting off the table the  lesion measured 2.5 X 1.5 cm . There was oozing of blood which required compression & leg elevation for 15 minutes to resolve. Pressure dressing applied.  General appearance is one of adequate nourishment w/o distress. Eyes: No conjunctival inflammation or scleral icterus is present. Oral exam:  lips and gums are healthy appearing.There is no oropharyngeal erythema or exudate noted.  Lungs:No increased work of breathing.  Abdomen: bowel sounds normal, soft and non-tender without masses, or organomegaly noted.  No guarding or rebound .  Musculoskeletal: Able to lie flat and sit up without help. Negative straight leg raising bilaterally. Gait slow & broad Skin:Warm & dry.  Intact without suspicious lesions or rashes ; no jaundice or tenting Lymphatic: No lymphadenopathy is noted about the head, neck, axilla             Assessment & Plan:  #1 acute hypoxic respiratory insufficiency, resolved  #2 acute kidney insufficiency, resolving #3 skin avulsion with  significant bleeding due to warfarin therapy  Plan: Prevanr 13 will be administered. He'll follow-up as needed.

## 2014-06-03 NOTE — Patient Instructions (Signed)
Dip gauze in  sterile saline and applied to the wound twice a day. Cover the wound with Telfa , non stick dressing  without any antibiotic ointment. The saline can be purchased at the drugstore or you can make your own .Boil cup of salt in a gallon of water. Store mixture  in a clean container.Report Warning  signs as discussed (red streaks, pus, fever, increasing pain). 

## 2014-06-04 ENCOUNTER — Ambulatory Visit: Payer: Medicare Other | Admitting: Internal Medicine

## 2014-06-05 ENCOUNTER — Encounter: Payer: Self-pay | Admitting: Endocrinology

## 2014-06-05 ENCOUNTER — Other Ambulatory Visit: Payer: Self-pay

## 2014-06-05 ENCOUNTER — Ambulatory Visit (INDEPENDENT_AMBULATORY_CARE_PROVIDER_SITE_OTHER): Payer: Medicare Other | Admitting: Endocrinology

## 2014-06-05 VITALS — BP 118/62 | HR 61 | Temp 97.9°F | Ht 72.0 in | Wt 250.0 lb

## 2014-06-05 DIAGNOSIS — N183 Chronic kidney disease, stage 3 unspecified: Secondary | ICD-10-CM

## 2014-06-05 DIAGNOSIS — E1149 Type 2 diabetes mellitus with other diabetic neurological complication: Secondary | ICD-10-CM | POA: Diagnosis not present

## 2014-06-05 DIAGNOSIS — E785 Hyperlipidemia, unspecified: Secondary | ICD-10-CM | POA: Diagnosis not present

## 2014-06-05 MED ORDER — METFORMIN HCL ER 750 MG PO TB24: ORAL_TABLET | ORAL | Status: DC

## 2014-06-05 NOTE — Patient Instructions (Signed)
Reduce lunch dose to 8 units and stay on 10 at lunch Reduce Levemir to 22  May use Relion brand of Novolin R instead of Humalog, will need to take 20 min before eating

## 2014-06-05 NOTE — Progress Notes (Signed)
Patient ID: [name], male   DOB: [birth year], 74 y.o.   MRN: [id number]    Reason for Appointment : F/u for Type 2 Diabetes  History of Present Illness          Diagnosis: Type 2 diabetes mellitus, date of diagnosis: 2008       Past history: He was initially treated with Metformin and also given Amayl at some point. With this he had fair control of his diabetes with A1c had usually been over 7% except once in 2013.  He was taken off metformin probably in March this year after a hospitalization, possibly because of renal dysfunction He was started on Levemir insulin in May when his blood sugars were significantly higher, fasting readings averaging 220.  Levemir progressively increased and was on 35 units at night since 12/20/12 He has not been on any other medications either orally or injectable for his diabetes Because of his A1c of over 10% in 10/14 on basal insulin alone he was started on NovoLog with each meal in 10/14  Recent history:    His blood sugars have come down significantly over the last couple of months and not clear why He has not changed his diet significantly and has not been any more active than usual However his renal function has fluctuated and is recently slightly worse  He was having blood sugars as low as 66 in the morning in February and his Levemir was reduced by 5 units Also his wife reduced his dose again by 5 units last weekend when his blood sugar was down to 70 in the morning Currently his blood sugars in the mornings are fairly close to normal and he has an average glucose of only 107 for the last month Postprandial readings: These are excellent also recently with only occasional readings in the 140s and 150s now  Has been taking NovoLog only 10 units now for his lunch and supper A1c is much lower at 6.4 His wife is usually helping him watch his diet  INSULIN regimen is described as:  25 Levemir hs NovoLog 10 units ac  Glucose  monitoring:  done 3 times a day        Glucometer:  Accu-Chek    Blood Glucose readings from his download recently  PRE-MEAL Breakfast  PCL   PCS  Bedtime Overall  Glucose range:  66-127   89-57   99-148     Mean/median:     107    Hypoglycemia frequency:  mild as above .           Self-care: The diet that the patient has been following is: Smaller portions and relatively balanced meals  Meals: 2 meals per day. Noon and 6 pm  Low fat, trying to eat more salads. Avoiding drinks with sugar and no juices       Physical activity: exercise: Minimal, limited by back pain           Dietician visit: Most recent: Unknown.  CDE visit: 11/14               Compliance with the medical regimen: Good  Retinal exam: Most recent: 6/14, reportedly normal  Weight control: This appears to be fluctuating significantly  Wt Readings from Last 3 Encounters:  06/05/14 250 lb (113.399 kg)  06/02/14 247 lb 8 oz (112.265 kg)  05/14/14 248 lb (112.492 kg)    Lab Results  Component Value Date   HGBA1C 6.4* 05/05/2014  HGBA1C 7.1* 03/03/2014   HGBA1C 7.3* 12/02/2013   Lab Results  Component Value Date   MICROALBUR 0.5 02/01/2013   LDLCALC 109* 03/03/2014   CREATININE 1.56* 06/02/2014       Medication List       This list is accurate as of: 06/05/14 11:59 PM.  Always use your most recent med list.               ACCU-CHEK FASTCLIX LANCETS Misc  USE ONE LANCET TO CHECK BLOOD SUGAR THREE TIMES DAILY     ACCU-CHEK SMARTVIEW test strip  Generic drug:  glucose blood     acetaminophen 325 MG tablet  Commonly known as:  TYLENOL  Take 975 mg by mouth every morning.     allopurinol 100 MG tablet  Commonly known as:  ZYLOPRIM  Take 1 tablet (100 mg total) by mouth daily.     amLODipine 5 MG tablet  Commonly known as:  NORVASC  Take 2.5 mg by mouth daily.     aspirin 81 MG tablet  Take 81 mg by mouth once a week.     atorvastatin 10 MG tablet  Commonly known as:  LIPITOR  Take 1 tablet  (10 mg total) by mouth daily.     carboxymethylcellulose 0.5 % Soln  Commonly known as:  REFRESH PLUS  2 drops daily.     carvedilol 6.25 MG tablet  Commonly known as:  COREG  TAKE ONE TABLET BY MOUTH TWICE DAILY     chlorthalidone 25 MG tablet  Commonly known as:  HYGROTON  Take when weight reaches 244 lbs. or greater only     Cholecalciferol 10000 UNITS Caps  Take 1,000 Units by mouth daily.     furosemide 80 MG tablet  Commonly known as:  LASIX  Take 1 tablet (80 mg total) by mouth 2 (two) times daily.     gabapentin 300 MG capsule  Commonly known as:  NEURONTIN  3 x daily as needed for leg pain     insulin lispro 100 UNIT/ML KiwkPen  Commonly known as:  HUMALOG KWIKPEN  Inject 0.12-0.14 mLs (12-14 Units total) into the skin 2 (two) times daily.     ipratropium 0.06 % nasal spray  Commonly known as:  ATROVENT  Place 1 spray into both nostrils as directed.     LEVEMIR FLEXTOUCH 100 UNIT/ML Pen  Generic drug:  Insulin Detemir  INJECT 35 UNITS INTO THE SKIN EVERY NIGHT AT BEDTIME     losartan 50 MG tablet  Commonly known as:  COZAAR  Take 1 tablet (50 mg total) by mouth daily.     metFORMIN 750 MG 24 hr tablet  Commonly known as:  GLUCOPHAGE-XR  TAKE ONE TABLET BY MOUTH ONCE DAILY WITH BREAKFAST     naphazoline-pheniramine 0.025-0.3 % ophthalmic solution  Commonly known as:  NAPHCON-A  Place 1 drop into both eyes daily as needed for irritation.     nitroGLYCERIN 0.4 MG SL tablet  Commonly known as:  NITROSTAT  Place 0.4 mg under the tongue every 5 (five) minutes as needed for chest pain (MAX 3 TABLETS).     OVER THE COUNTER MEDICATION  Take by mouth as needed (Cough).     Potassium Chloride ER 20 MEQ Tbcr  Take 30 mcg by mouth 2 (two) times daily.     warfarin 2.5 MG tablet  Commonly known as:  COUMADIN  Take 2.5 mg daily except Wednesday, take 5 mg.  Allergies:  Allergies  Allergen Reactions  . Avelox [Moxifloxacin Hcl In Nacl] Swelling,  Rash and Other (See Comments)    Patient became hypotensive after infusion started Because of a history of documented adverse serious drug reaction;Medi Alert bracelet  is recommended  . Penicillins Anaphylaxis    REACTION: anaphylaxis Because of a history of documented adverse serious drug reaction;Medi Alert bracelet  is recommended    Past Medical History  Diagnosis Date  . Diabetes mellitus   . Hyperlipidemia   . Hypertension   . Pilonidal cyst   . Atrial fibrillation     Previous long-term amiodarone therapy with multiple cardioversions / amiodarone stopped September, 2009  . Atrial flutter     Started November, 2010, Left-sided and cannot ablate  . Left atrial thrombus     Remote past... cardioversions done since that time  . Wide-complex tachycardia   . Left ventricular ejection fraction less than 40%   . Gout   . AAA (abdominal aortic aneurysm)     Surgical repair  . Discolored skin   . S/P ICD (internal cardiac defibrillator) procedure     Dr. Lovena Le 2009... by the pacing  . SOB (shortness of breath)     Large left effusion/ thoracentesis/hospitalization/November, 2011... exudated.. cytology negative.. Dr.Wert.. no proof of mesothelioma  . Pericardial effusion   . Pleural effusion     Large loculated effusion on the left side November, 2011. This was tapped. It was exudative. Cytology revealed no cancer no proof of mesothelioma area pulmonary team felt that no further workup was needed  . S/P AAA repair   . Spinal stenosis     Surgery Dr.Elsner  . CAD (coronary artery disease)     Catheterization July, 2008... name and vein grafts patent but low cardiac output  . Warfarin anticoagulation   . Cardiomyopathy     Ischemic... ICD  . Combined systolic and diastolic CHF   . Venous insufficiency     Toe discoloration chronic  . Mitral regurgitation     Mild echo  . Aortic valve sclerosis   . Nasal drainage     Chronic  . Alcohol ingestion of more than four drinks  per week     Excess beer  not a dependency problem  . Chronotropic incompetence     IV pacing rate adjusted  . Thrombophlebitis of superficial veins of upper extremities     Possible venous stenosis from defibrillator  . Pericardial effusion     November, 2011 .. decreased during hospitalization  . Eye abnormality     Ophthalmologist questions a clot in one of his eyes, May, 2012  . Overweight(278.02)     November, 2012  . Pleural thickening   . Ejection fraction     Ejection fraction has varied over time from 35-50%.,, Echoes are technically very difficult,,, EF 50%, echo, May 25, 2011, technically very difficult  . Drug therapy     Redness and swelling with Avelox infusion May 24, 2011  . COPD (chronic obstructive pulmonary disease)   . Myocardial infarction   . Spinal cord stimulator status     October, 2013  . Carotid artery disease     Doppler, December, 2013, 0-39% bilateral  . Pneumonia   . Arthritis   . Ventral hernia     April, 2014, result of his abdominal surgery  . Bony abnormality     Patient's manubrium is slightly displaced to the right  . Ascending aortic aneurysm 05/10/2014    Past  Surgical History  Procedure Laterality Date  . Colonoscopy w/ polypectomy    . Lumbar fusion    . Pilonidal cyst removal    . Surgery scrotal / testicular    . Coronary artery bypass graft  2004  . Abdominal aortic aneurysm repair  11/2002  . Bi-ventricular pacemaker insertion (crt-p)  02-11-2013    Pt with previously implanted MDT CRTD downgraded to CRTP by Dr Lovena Le 02-11-13  . Back surgery    . Incision and drainage abscess / hematoma of bursa / knee / thigh    . Implantable cardioverter defibrillator (icd) generator change N/A 02/11/2013    Procedure: ICD GENERATOR CHANGE;  Surgeon: Evans Lance, MD;  Location: [facility] CATH LAB;  Service: Cardiovascular;  Laterality: N/A;    Family History  Problem Relation Age of Onset  . Hypertension Mother   . Stroke Mother   .  Diabetes Father   . Coronary artery disease Father   . Other Father     DVT    Social History:  reports that he quit smoking about 19 years ago. His smoking use included Cigarettes. He started smoking about 58 years ago. He has a 117 pack-year smoking history. He has never used smokeless tobacco. He reports that he drinks about 1.2 oz of alcohol per week. He reports that he does not use illicit drugs.    Review of Systems       Lipids: Treated with Lipitor, currently only taking 10 mg Hyperlipidemia: LDL is still over 100 although HDL 45 and triglycerides below 200, he is being managed by PCP/cardiologist   Lab Results  Component Value Date   CHOL 172 03/03/2014   HDL 42.20 03/03/2014   LDLCALC 109* 03/03/2014   TRIG 106.0 03/03/2014   CHOLHDL 4 03/03/2014       The blood pressure has been under control with current regimen of chlorthalidone, amlodipine and losartan, BP at home usually normal Also he takes extra chlorthalidone when he has weight gain  Renal dysfunction: Creatinine has been quite variable, was improved until recently when creatinine is back up slightly    Has known neuropathy: He has had pains and tingling in his feet and lower legs.  Mostly having some symptoms in day; the tops of his feet hurt when he starts walking He is taking gabapentin as directed  HYPOKALEMIA: Appears improved    LABS:  Lab on 06/02/2014  Component Date Value Ref Range Status  . Sodium 06/02/2014 140  135 - 145 mEq/L Final  . Potassium 06/02/2014 4.3  3.5 - 5.1 mEq/L Final  . Chloride 06/02/2014 107  96 - 112 mEq/L Final  . CO2 06/02/2014 28  19 - 32 mEq/L Final  . Glucose, Bld 06/02/2014 99  70 - 99 mg/dL Final  . BUN 06/02/2014 24* 6 - 23 mg/dL Final  . Creatinine, Ser 06/02/2014 1.56* 0.40 - 1.50 mg/dL Final  . Calcium 06/02/2014 9.0  8.4 - 10.5 mg/dL Final  . GFR 06/02/2014 46.58* >60.00 mL/min Final    Physical Examination:  BP 118/62 mmHg  Pulse 61  Temp(Src) 97.9  F (36.6 C) (Oral)  Ht 6' (1.829 m)  Wt 250 lb (113.399 kg)  BMI 33.90 kg/m2  SpO2 97%  No ankle edema    ASSESSMENT/PLAN:  Diabetes type 2  His blood sugar control is much improved and A1c is down to 6.4% Not clear if his insulin sensitivity has improved over the last month or so because of worsening renal function  since creatinine has fluctuated before also without affecting his glucose control He has not changed his diet and is not losing any significant amount of weight either   Most of his sugars in the mornings are below 90 and he can reduce his Levemir again further; discussed adjusting this every 3-5 days based on fasting blood sugar trend and also expected blood sugar targets both before and after meals   Also may be able to reduce his mealtime insulin at lunch since postprandial readings are often below 100 also; again discussed targets for postprandial readings  Since he wants to try a generic medication for mealtime insulin he can use the [employer] brand with the syringe Discussed that regular insulin will need to be taken 20-30 minutes before eating and may last longer  Meanwhile he can try Humalog with the free coupon  RENAL dysfunction: He will discuss his diuretic doses with the cardiologist tomorrow as he appears to have increasing creatinine without any current edema  HYPERLIPIDEMIA: He does need more aggressive control and will defer this again to cardiologist or PCP  Patient Instructions  Reduce lunch dose to 8 units and stay on 10 at lunch Reduce Levemir to 22  May use Relion brand of Novolin R instead of Humalog, will need to take 20 min before eating   Counseling time over 50% of today's 25 minute visit Vernel Langenderfer 06/06/2014, 10:36 AM

## 2014-06-06 ENCOUNTER — Telehealth: Payer: Self-pay

## 2014-06-06 ENCOUNTER — Ambulatory Visit (INDEPENDENT_AMBULATORY_CARE_PROVIDER_SITE_OTHER): Payer: Medicare Other | Admitting: Cardiology

## 2014-06-06 ENCOUNTER — Encounter: Payer: Self-pay | Admitting: Cardiology

## 2014-06-06 ENCOUNTER — Ambulatory Visit (INDEPENDENT_AMBULATORY_CARE_PROVIDER_SITE_OTHER): Payer: Medicare Other | Admitting: *Deleted

## 2014-06-06 VITALS — BP 148/70 | HR 67 | Ht 72.0 in | Wt 249.0 lb

## 2014-06-06 DIAGNOSIS — Z7901 Long term (current) use of anticoagulants: Secondary | ICD-10-CM | POA: Diagnosis not present

## 2014-06-06 DIAGNOSIS — I7121 Aneurysm of the ascending aorta, without rupture: Secondary | ICD-10-CM

## 2014-06-06 DIAGNOSIS — D582 Other hemoglobinopathies: Secondary | ICD-10-CM

## 2014-06-06 DIAGNOSIS — I712 Thoracic aortic aneurysm, without rupture: Secondary | ICD-10-CM

## 2014-06-06 DIAGNOSIS — I5022 Chronic systolic (congestive) heart failure: Secondary | ICD-10-CM | POA: Diagnosis not present

## 2014-06-06 DIAGNOSIS — I4891 Unspecified atrial fibrillation: Secondary | ICD-10-CM

## 2014-06-06 DIAGNOSIS — I482 Chronic atrial fibrillation, unspecified: Secondary | ICD-10-CM

## 2014-06-06 DIAGNOSIS — N183 Chronic kidney disease, stage 3 unspecified: Secondary | ICD-10-CM

## 2014-06-06 DIAGNOSIS — R9430 Abnormal result of cardiovascular function study, unspecified: Secondary | ICD-10-CM

## 2014-06-06 DIAGNOSIS — R0989 Other specified symptoms and signs involving the circulatory and respiratory systems: Secondary | ICD-10-CM | POA: Diagnosis not present

## 2014-06-06 LAB — POCT INR: INR: 2.9

## 2014-06-06 NOTE — Patient Instructions (Addendum)
**Note De-Identified Kamber Vignola Obfuscation** Your physician recommends that you continue on your current medications as directed. Please refer to the Current Medication list given to you today.  Your physician recommends that you return for lab work in: about 2 weeks (BMET)  Your physician recommends that you schedule a follow-up appointment in: 3 to 4 weeks

## 2014-06-06 NOTE — Assessment & Plan Note (Signed)
Patient continues on Coumadin.  As part of today's evaluation I spent greater than 25 minutes with the patient's total care. More than half of this amount of time was spent with direct contact with the patient and his wife. We talked about his hospitalization and about signs to follow if he begins to deteriorate again. We discussed his medications in length.

## 2014-06-06 NOTE — Assessment & Plan Note (Signed)
He was noted at his last hospitalization that his ascending aorta measured 46 mm. He cannot have an MRI because of his ICD. At that time CT scan was not done because of renal dysfunction. I will consider a CT at a later date.

## 2014-06-06 NOTE — Assessment & Plan Note (Addendum)
Volume status is stable today. When he developed significant bronchitis he had significant worsening of his underlying CHF. He deteriorated with respiratory failure. He is much better today. We are back on his usual regimen of daily weights and diuretics. Adjustments are made in his diuretics if he goes over a certain weight.[name]

## 2014-06-06 NOTE — Assessment & Plan Note (Signed)
He has chronic atrial fibrillation and atrial flutter. Amiodarone had been used but was eventually stopped. His rate is controlled.

## 2014-06-06 NOTE — Progress Notes (Signed)
Cardiology Office Note   Date:  06/06/2014   ID:  [name], DOB [birth year], MRN [id number]  PCP:  [name], MD  Cardiologist:  Dola Argyle, MD   Chief Complaint  Patient presents with  . Appointment    Follow-up coronary disease and CHF      History of Present Illness: [name] is a 74 y.o. male who presents today to follow-up CHF. He was hospitalized in February, 2016. His wife has developed bronchitis and then he developed bronchitis. She recovered rapidly but he deteriorated. He became fluid overloaded and was hospitalized for the treatment of the combination of bronchitis and CHF. Fortunately he received excellent care in the hospital and improved. He was seen early posthospitalization by Truitt Merle, NP. She felt that he had some volume overload. His initial discharge orders included diuretic dosing lower than previously. She and I spoken the phone and his meds were adjusted. He is done very well since then. His weight is under good control. He's taking his Lasix but is not requiring additional chlorthalidone. At one point his creatinine had been over 2.0. At its lowest point it was 1.3. Most recently BUN was 24 with a creatinine 1.56 and potassium 4.3. These are reasonable numbers for him.  As part of today's evaluation I have reviewed the hospital records. I have also reviewed the past office notes. I have reviewed labs. I have had a long discussion with the patient and his wife about his hospitalization and the events leading up to it.  Past Medical History  Diagnosis Date  . Diabetes mellitus   . Hyperlipidemia   . Hypertension   . Pilonidal cyst   . Atrial fibrillation     Previous long-term amiodarone therapy with multiple cardioversions / amiodarone stopped September, 2009  . Atrial flutter     Started November, 2010, Left-sided and cannot ablate  . Left atrial thrombus     Remote past... cardioversions done since that time  . Wide-complex  tachycardia   . Left ventricular ejection fraction less than 40%   . Gout   . AAA (abdominal aortic aneurysm)     Surgical repair  . Discolored skin   . S/P ICD (internal cardiac defibrillator) procedure     Dr. Lovena Le 2009... by the pacing  . SOB (shortness of breath)     Large left effusion/ thoracentesis/hospitalization/November, 2011... exudated.. cytology negative.. Dr.Wert.. no proof of mesothelioma  . Pericardial effusion   . Pleural effusion     Large loculated effusion on the left side November, 2011. This was tapped. It was exudative. Cytology revealed no cancer no proof of mesothelioma area pulmonary team felt that no further workup was needed  . S/P AAA repair   . Spinal stenosis     Surgery Dr.Elsner  . CAD (coronary artery disease)     Catheterization July, 2008... name and vein grafts patent but low cardiac output  . Warfarin anticoagulation   . Cardiomyopathy     Ischemic... ICD  . Combined systolic and diastolic CHF   . Venous insufficiency     Toe discoloration chronic  . Mitral regurgitation     Mild echo  . Aortic valve sclerosis   . Nasal drainage     Chronic  . Alcohol ingestion of more than four drinks per week     Excess beer  not a dependency problem  . Chronotropic incompetence     IV pacing rate adjusted  . Thrombophlebitis of superficial veins  of upper extremities     Possible venous stenosis from defibrillator  . Pericardial effusion     November, 2011 .. decreased during hospitalization  . Eye abnormality     Ophthalmologist questions a clot in one of his eyes, May, 2012  . Overweight(278.02)     November, 2012  . Pleural thickening   . Ejection fraction     Ejection fraction has varied over time from 35-50%.,, Echoes are technically very difficult,,, EF 50%, echo, May 25, 2011, technically very difficult  . Drug therapy     Redness and swelling with Avelox infusion May 24, 2011  . COPD (chronic obstructive pulmonary disease)   .  Myocardial infarction   . Spinal cord stimulator status     October, 2013  . Carotid artery disease     Doppler, December, 2013, 0-39% bilateral  . Pneumonia   . Arthritis   . Ventral hernia     April, 2014, result of his abdominal surgery  . Bony abnormality     Patient's manubrium is slightly displaced to the right  . Ascending aortic aneurysm 05/10/2014    Past Surgical History  Procedure Laterality Date  . Colonoscopy w/ polypectomy    . Lumbar fusion    . Pilonidal cyst removal    . Surgery scrotal / testicular    . Coronary artery bypass graft  2004  . Abdominal aortic aneurysm repair  11/2002  . Bi-ventricular pacemaker insertion (crt-p)  02-11-2013    Pt with previously implanted MDT CRTD downgraded to CRTP by Dr Lovena Le 02-11-13  . Back surgery    . Incision and drainage abscess / hematoma of bursa / knee / thigh    . Implantable cardioverter defibrillator (icd) generator change N/A 02/11/2013    Procedure: ICD GENERATOR CHANGE;  Surgeon: Evans Lance, MD;  Location: [facility] CATH LAB;  Service: Cardiovascular;  Laterality: N/A;    Patient Active Problem List   Diagnosis Date Noted  . AKI (acute kidney injury)   . Ascending aortic aneurysm 05/10/2014  . Hypoxia 05/05/2014  . Acute respiratory failure with hypoxia 05/05/2014  . Acute exacerbation of congestive heart failure 05/05/2014  . Acute renal failure 05/05/2014  . COPD exacerbation 05/05/2014  . Acute urinary retention 05/05/2014  . Diabetes 05/05/2014  . Chronic systolic CHF (congestive heart failure) 03/07/2014  . History of DVT (deep vein thrombosis) 03/07/2014  . Cardiomyopathy, ischemic 02/05/2014  . Pleural plaque without asbestosis 01/23/2014  . Elevated hemoglobin 01/14/2014  . CKD (chronic kidney disease) stage 3, GFR 30-59 ml/min 01/14/2014  . Asbestos exposure 01/14/2014  . Shortness of breath 01/01/2014  . Abscess, scrotum 03/05/2013  . Ejection fraction   . Hypopotassemia 12/20/2012  .  ACE-inhibitor cough 10/04/2012  . Ventral hernia   . Bony abnormality   . Cellulitis of scrotum 05/23/2012  . Carotid artery disease   . Spinal cord stimulator status   . 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 Atrial fibrillation   . Spinal stenosis   . CAD (coronary artery disease)   . Venous insufficiency   . Mitral regurgitation   . Aortic valve sclerosis   . Alcohol ingestion of more than four drinks per week   . Chronotropic incompetence   . Thrombophlebitis of superficial veins of upper extremities   . Pericardial effusion   . Overweight   . Pleural effusion   . Drug therapy   . Hyperlipidemia   . Hypertension   . Atrial flutter   .  Left atrial thrombus   . AAA (abdominal aortic aneurysm)   . S/P ICD (internal cardiac defibrillator) procedure   . Warfarin anticoagulation   . Diabetic polyneuropathy 01/07/2010  . DIVERTICULOSIS, COLON 06/10/2009  . Uncontrolled diabetes mellitus with peripheral circulatory disorder 03/11/2009  . CKD (chronic kidney disease), stage III 06/19/2008  . BACK PAIN, LUMBAR 06/12/2008  . 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 Acute gout 07/27/2006  . CORONARY ARTERY BYPASS GRAFT, HX OF 07/27/2006      Current Outpatient Prescriptions  Medication Sig Dispense Refill  . ACCU-CHEK FASTCLIX LANCETS MISC USE ONE LANCET TO CHECK BLOOD SUGAR THREE TIMES DAILY 102 each 5  . ACCU-CHEK SMARTVIEW test strip     . acetaminophen (TYLENOL) 325 MG tablet Take 975 mg by mouth every morning.    Marland Kitchen allopurinol (ZYLOPRIM) 100 MG tablet Take 1 tablet (100 mg total) by mouth daily. 90 tablet 3  . amLODipine (NORVASC) 5 MG tablet Take 2.5 mg by mouth daily.    Marland Kitchen aspirin 81 MG tablet Take 81 mg by mouth once a week.     Marland Kitchen atorvastatin (LIPITOR) 10 MG tablet Take 1 tablet (10 mg total) by mouth daily. 90 tablet 3  . carboxymethylcellulose (REFRESH PLUS) 0.5 % SOLN 2 drops daily.    . carvedilol (COREG) 6.25 MG tablet TAKE  ONE TABLET BY MOUTH TWICE DAILY 180 tablet 1  . chlorthalidone (HYGROTON) 25 MG tablet Take when weight reaches 244 lbs. or greater only 90 tablet 3  . Cholecalciferol 10000 UNITS CAPS Take 1,000 Units by mouth daily.    . furosemide (LASIX) 80 MG tablet Take 1 tablet (80 mg total) by mouth 2 (two) times daily. 90 tablet 3  . gabapentin (NEURONTIN) 300 MG capsule 3 x daily as needed for leg pain 270 capsule 3  . insulin lispro (HUMALOG KWIKPEN) 100 UNIT/ML KiwkPen Inject 0.12-0.14 mLs (12-14 Units total) into the skin 2 (two) times daily. 5 pen 3  . ipratropium (ATROVENT) 0.06 % nasal spray Place 1 spray into both nostrils as directed.     Marland Kitchen LEVEMIR FLEXTOUCH 100 UNIT/ML Pen INJECT 35 UNITS INTO THE SKIN EVERY NIGHT AT BEDTIME 15 mL 5  . losartan (COZAAR) 50 MG tablet Take 1 tablet (50 mg total) by mouth daily. 30 tablet 3  . metFORMIN (GLUCOPHAGE-XR) 750 MG 24 hr tablet TAKE ONE TABLET BY MOUTH ONCE DAILY WITH BREAKFAST 90 tablet 1  . naphazoline-pheniramine (NAPHCON-A) 0.025-0.3 % ophthalmic solution Place 1 drop into both eyes daily as needed for irritation.    . nitroGLYCERIN (NITROSTAT) 0.4 MG SL tablet Place 0.4 mg under the tongue every 5 (five) minutes as needed for chest pain (MAX 3 TABLETS).     Marland Kitchen OVER THE COUNTER MEDICATION Take by mouth as needed (Cough).    . Potassium Chloride ER 20 MEQ TBCR Take 30 mcg by mouth 2 (two) times daily. 60 tablet   . warfarin (COUMADIN) 2.5 MG tablet Take 2.5 mg daily except Wednesday, take 5 mg. 130 tablet 0   No current facility-administered medications for this visit.    Allergies:   Avelox and Penicillins    Social History:  The patient  reports that he quit smoking about 19 years ago. His smoking use included Cigarettes. He started smoking about 58 years ago. He has a 117 pack-year smoking history. He has never used smokeless tobacco. He reports that he drinks about 1.2 oz of alcohol per week. He reports that he does not  use illicit drugs.    Family History:  The patient's family history includes Coronary artery disease in his father; Diabetes in his father; Hypertension in his mother; Other in his father; Stroke in his mother.    ROS:  Please see the history of present illness.     Patient denies fever, chills, headache, sweats, rash, change in vision, change in hearing, chest pain, cough, nausea or vomiting, urinary symptoms. All other systems are reviewed and are negative.   PHYSICAL EXAM: VS:  BP 148/70 mmHg  Pulse 67  Ht 6' (1.829 m)  Wt 249 lb (112.946 kg)  BMI 33.76 kg/m2  SpO2 98% , patient looks very good today. He is oriented to person time and place. Affect is normal. Head is atraumatic. Sclerae and conjunctiva are normal. There is no jugulovenous distention. Lungs are clear. Respiratory effort is nonlabored. Cardiac exam reveals S1 and S2. The rhythm is irregularly irregular. The rate is controlled. Abdomen is protuberant as always but relatively soft. There is no peripheral edema. There are no musculoskeletal deformities. Therein rashes.  EKG:   EKG is not done today.   Recent Labs: 07/08/2013: TSH 0.69 04/22/2014: ALT 15 05/05/2014: B Natriuretic Peptide 607.3* 05/08/2014: Magnesium 2.7* 05/14/2014: Hemoglobin 15.4; Platelets 299.0; Pro B Natriuretic peptide (BNP) 670.0* 06/02/2014: BUN 24*; Creatinine 1.56*; Potassium 4.3; Sodium 140    Lipid Panel    Component Value Date/Time   CHOL 172 03/03/2014 1134   TRIG 106.0 03/03/2014 1134   HDL 42.20 03/03/2014 1134   CHOLHDL 4 03/03/2014 1134   VLDL 21.2 03/03/2014 1134   LDLCALC 109* 03/03/2014 1134      Wt Readings from Last 3 Encounters:  06/06/14 249 lb (112.946 kg)  06/05/14 250 lb (113.399 kg)  06/02/14 247 lb 8 oz (112.265 kg)      Current medicines are reviewed       ASSESSMENT AND PLAN:

## 2014-06-06 NOTE — Telephone Encounter (Signed)
**Note De-Identified Stephan Nelis Obfuscation** 05/20/14  Weight= 237 lbs.  The pts wife, Joaquim Lai, states that the pt has continued right leg edema and SOB with exertion.  05/21/14  Weight= 239 lbs.  Joaquim Lai reports no changes in the pts edema or SOB status at this time. 05/22/14  Weight= 239 lbs.  The pt states that he has continued right leg edema and SOB with exertion at this time. 05/23/14  Weight= 239 lbs.  Joaquim Lai reports no changes in the pts edema or SOB status at this time. 05/24/14  Weight= 240 lbs.  Joaquim Lai reports no changes in the pts edema or SOB status at this time. 05/25/14  Weight= 237 lbs.  Joaquim Lai reports no changes in the pts edema or SOB status at this time. 05/26/14  Weight= 240 lbs.  The pt states that he has continued right leg edema and SOB with exertion at this time. 05/27/14  Weight= 239 lbs.  The pts wife, Joaquim Lai, states that the pt has continued right leg edema and SOB with exertion.  05/28/14  Weight= 238 lbs.   The pt states that he has continued right leg edema and SOB with exertion at this time. 05/29/14  Weight= 239 lbs.  Joaquim Lai states that the pt has continued right leg edema and mild SOB at this time. 05/30/14  Weight= 237 lbs.   Joaquim Lai reports no changes in the pts edema or SOB status at this time.

## 2014-06-06 NOTE — Assessment & Plan Note (Signed)
Before he became sick in February, 2016, he was undergoing intermittent phlebotomy for the possibility that high hemoglobin was detrimental to him.

## 2014-06-06 NOTE — Assessment & Plan Note (Signed)
I continue to watch his renal status carefully. Creatinine in the range of 1.6 is okay for him. We have seen in the little lower. However this level appears to be consistent with a good functional weight for him.

## 2014-06-23 ENCOUNTER — Other Ambulatory Visit (INDEPENDENT_AMBULATORY_CARE_PROVIDER_SITE_OTHER): Payer: Medicare Other | Admitting: *Deleted

## 2014-06-23 DIAGNOSIS — I712 Thoracic aortic aneurysm, without rupture: Secondary | ICD-10-CM

## 2014-06-23 DIAGNOSIS — I482 Chronic atrial fibrillation, unspecified: Secondary | ICD-10-CM

## 2014-06-23 DIAGNOSIS — R0989 Other specified symptoms and signs involving the circulatory and respiratory systems: Secondary | ICD-10-CM

## 2014-06-23 DIAGNOSIS — R9430 Abnormal result of cardiovascular function study, unspecified: Secondary | ICD-10-CM

## 2014-06-23 DIAGNOSIS — I7121 Aneurysm of the ascending aorta, without rupture: Secondary | ICD-10-CM

## 2014-06-23 LAB — BASIC METABOLIC PANEL
BUN: 33 mg/dL — ABNORMAL HIGH (ref 6–23)
CO2: 30 mEq/L (ref 19–32)
Calcium: 9.4 mg/dL (ref 8.4–10.5)
Chloride: 101 mEq/L (ref 96–112)
Creatinine, Ser: 1.69 mg/dL — ABNORMAL HIGH (ref 0.40–1.50)
GFR: 42.47 mL/min — ABNORMAL LOW (ref >60.00)
GLUCOSE: 141 mg/dL — AB (ref 70–99)
POTASSIUM: 3.6 meq/L (ref 3.5–5.1)
Sodium: 139 mEq/L (ref 135–145)

## 2014-06-25 ENCOUNTER — Telehealth: Payer: Self-pay | Admitting: Cardiology

## 2014-06-25 NOTE — Telephone Encounter (Signed)
New problem    Pt's wife stated she was told by you to call office today concerning her husband. Please call pt's wife

## 2014-06-25 NOTE — Telephone Encounter (Signed)
**Note De-Identified Johnesha Acheampong Obfuscation** The pts wife, Joaquim Lai, states that they need to go out of town Friday afternoon and wants to know if the pts 4/8 appt can be moved to an earlier appt that day. Will discuss with [name].

## 2014-06-26 DIAGNOSIS — L718 Other rosacea: Secondary | ICD-10-CM | POA: Diagnosis not present

## 2014-06-26 NOTE — Telephone Encounter (Signed)
Follow Up        Pt returning Lynn's phone call.

## 2014-06-26 NOTE — Telephone Encounter (Signed)
**Note De-Identified Benny Henrie Obfuscation** 05/31/14  Weight= 238 lbs. The pts wife, Joaquim Lai, states that the pt has continued swelling in his right leg and mild SOB at this time. 06/01/14  Weight= 237 lbs.  The pts wife, Joaquim Lai, states that the pt has continued swelling in his right leg and mild SOB at this time. 06/02/14  Weight= 239 lbs.  The pts wife, Joaquim Lai, states that the pt has continued swelling in his right leg and mild SOB at this time. 06/03/14  Weight= 239 lbs.  The pts wife, Joaquim Lai, states that the pt has continued swelling in his right leg and mild SOB at this time. 06/04/14  Weight= 239 lbs.  The pts wife, Joaquim Lai, states that the pt has continued swelling in his right leg and mild SOB at this time. 06/05/14  Weight= 242 lbs.  The pts wife, Joaquim Lai, states that the pt has continued swelling in his right leg and mild SOB at this time. 06/06/14  Weight= 241 lbs.  The pts wife, Joaquim Lai, states that the pt has continued swelling in his right leg and mild SOB at this time. 06/07/14  Weight= 240 lbs.  The pts wife, Joaquim Lai, states that the pt has continued swelling in his right leg and mild SOB at this time. 06/08/14  Weight= 240 lbs.  The pts wife, Joaquim Lai, states that the pt has continued swelling in his right leg and mild SOB at this time. 06/09/14  Weight= 240 lbs.  The pts wife, Joaquim Lai, states that the pt has continued swelling in his right leg and mild SOB at this time. 06/10/14  Weight= 238 lbs.  The pts wife, Joaquim Lai, states that the pt has continued swelling in his right leg and mild SOB at this time. 06/11/14  Weight= 241 lbs.  The pts wife, Joaquim Lai, states that the pt has continued swelling in his right leg and mild SOB at this time. 06/12/14  Weight= 243 lbs.  The pts wife, Joaquim Lai, states that the pt has continued swelling in his right leg and mild SOB at this time. 06/13/14  Weight= 243 lbs.  The pts wife, Joaquim Lai, states that the pt has continued swelling in his right leg and mild SOB at this time. 06/14/14  Weight= 244 lbs.  The  pts wife, Joaquim Lai, states that the pt has continued swelling in his right leg and mild SOB at this time. 06/15/14  Weight= 246 lbs.   The pts wife, Joaquim Lai, states that the pt has continued swelling in his right leg and mild SOB at this time. The pt took a Chlorthiladone 2.5 mg dose due to weight greater than 244 lbs. 06/16/14  Weight= 245 lbs.  The pts wife, Joaquim Lai, states that the pt has continued swelling in his right leg and mild SOB at this time. The pt took a Chlorthiladone 2.5 mg dose due to weight greater than 244 lbs. 06/17/14  Weight= 242 lbs.   The pts wife, Joaquim Lai, states that the pt has continued swelling in his right leg and mild SOB at this time. 06/18/14  Weight= 240 lbs.   The pts wife, Joaquim Lai, states that the pt has continued swelling in his right leg and mild SOB at this time. 06/19/14  Weight= 243 lbs.  The pts wife, Joaquim Lai, states that the pt has continued swelling in his right leg and mild SOB at this time.

## 2014-06-26 NOTE — Telephone Encounter (Signed)
**Note De-Identified [name] Obfuscation** I left a detailed message on the pts VM stating that his appt has been moved up to 10:45 on 4/8.

## 2014-06-27 ENCOUNTER — Ambulatory Visit (INDEPENDENT_AMBULATORY_CARE_PROVIDER_SITE_OTHER): Payer: Medicare Other | Admitting: Pharmacist

## 2014-06-27 ENCOUNTER — Ambulatory Visit: Payer: Medicare Other | Admitting: Cardiology

## 2014-06-27 ENCOUNTER — Ambulatory Visit (INDEPENDENT_AMBULATORY_CARE_PROVIDER_SITE_OTHER): Payer: Medicare Other | Admitting: Cardiology

## 2014-06-27 ENCOUNTER — Encounter: Payer: Self-pay | Admitting: Cardiology

## 2014-06-27 VITALS — BP 116/74 | HR 49 | Ht 72.0 in | Wt 251.6 lb

## 2014-06-27 DIAGNOSIS — N183 Chronic kidney disease, stage 3 unspecified: Secondary | ICD-10-CM

## 2014-06-27 DIAGNOSIS — N289 Disorder of kidney and ureter, unspecified: Secondary | ICD-10-CM

## 2014-06-27 DIAGNOSIS — I4891 Unspecified atrial fibrillation: Secondary | ICD-10-CM

## 2014-06-27 DIAGNOSIS — Z7901 Long term (current) use of anticoagulants: Secondary | ICD-10-CM | POA: Diagnosis not present

## 2014-06-27 DIAGNOSIS — I5022 Chronic systolic (congestive) heart failure: Secondary | ICD-10-CM

## 2014-06-27 DIAGNOSIS — D582 Other hemoglobinopathies: Secondary | ICD-10-CM | POA: Diagnosis not present

## 2014-06-27 LAB — CBC WITH DIFFERENTIAL/PLATELET
BASOS PCT: 0.3 % (ref 0.0–3.0)
Basophils Absolute: 0 10*3/uL (ref 0.0–0.1)
EOS ABS: 0.1 10*3/uL (ref 0.0–0.7)
Eosinophils Relative: 1.4 % (ref 0.0–5.0)
HCT: 49.2 % (ref 39.0–52.0)
Hemoglobin: 16.6 g/dL (ref 13.0–17.0)
LYMPHS PCT: 19.9 % (ref 12.0–46.0)
Lymphs Abs: 1.4 10*3/uL (ref 0.7–4.0)
MCHC: 33.8 g/dL (ref 30.0–36.0)
MCV: 90 fl (ref 78.0–100.0)
MONOS PCT: 9.3 % (ref 3.0–12.0)
Monocytes Absolute: 0.6 10*3/uL (ref 0.1–1.0)
Neutro Abs: 4.8 10*3/uL (ref 1.4–7.7)
Neutrophils Relative %: 69.1 % (ref 43.0–77.0)
Platelets: 180 10*3/uL (ref 150.0–400.0)
RBC: 5.47 Mil/uL (ref 4.22–5.81)
RDW: 15.5 % (ref 11.5–15.5)
WBC: 6.9 10*3/uL (ref 4.0–10.5)

## 2014-06-27 LAB — BASIC METABOLIC PANEL
BUN: 27 mg/dL — ABNORMAL HIGH (ref 6–23)
CALCIUM: 9.5 mg/dL (ref 8.4–10.5)
CO2: 28 mEq/L (ref 19–32)
Chloride: 101 mEq/L (ref 96–112)
Creatinine, Ser: 1.44 mg/dL (ref 0.40–1.50)
GFR: 51.08 mL/min — AB (ref >60.00)
GLUCOSE: 129 mg/dL — AB (ref 70–99)
POTASSIUM: 4.1 meq/L (ref 3.5–5.1)
Sodium: 138 mEq/L (ref 135–145)

## 2014-06-27 LAB — POCT INR: INR: 3

## 2014-06-27 NOTE — Assessment & Plan Note (Signed)
His last creatinine was up to 1.65. Repeat will be done today.

## 2014-06-27 NOTE — Assessment & Plan Note (Signed)
Volume status is stable. No change in therapy today.

## 2014-06-27 NOTE — Patient Instructions (Signed)
Your physician recommends that you continue on your current medications as directed. Please refer to the Current Medication list given to you today.   Your physician recommends that you have lab work today: bmet/cbc   Your physician recommends that you keep your scheduled follow-up appointment on May 20th with Dr. [name].

## 2014-06-27 NOTE — Assessment & Plan Note (Signed)
CBC will be checked today. I will then be in touch with Dr. Antonieta Pert  Office to see if an when a follow-up phlebotomy would be indicated.

## 2014-06-27 NOTE — Progress Notes (Signed)
Cardiology Office Note   Date:  06/27/2014   ID:  [name], DOB [birth year], MRN [id number]  PCP:  [name], MD  Cardiologist:  Dola Argyle, MD   Chief Complaint  Patient presents with  . Appointment     follow-up CHF      History of Present Illness: [name] is a 74 y.o. male who presents  Today to follow-up CHF, renal function, hemoglobin. I saw him last June 06, 2014. That visit occurred shortly after his hospitalization with significant respiratory failure all along with volume overload. His respiratory failure was related to a viral illness. Fortunately he recovered. He continues to do well. When chemistry was checked at the time of the last visit his creatinine was slightly higher than his usual. This will be repeated today. Also, he has not had a follow-up phlebotomy that we are using to help him with symptoms of shortness of breath. CBC will be checked today.    Past Medical History  Diagnosis Date  . Diabetes mellitus   . Hyperlipidemia   . Hypertension   . Pilonidal cyst   . Atrial fibrillation     Previous long-term amiodarone therapy with multiple cardioversions / amiodarone stopped September, 2009  . Atrial flutter     Started November, 2010, Left-sided and cannot ablate  . Left atrial thrombus     Remote past... cardioversions done since that time  . Wide-complex tachycardia   . Left ventricular ejection fraction less than 40%   . Gout   . AAA (abdominal aortic aneurysm)     Surgical repair  . Discolored skin   . S/P ICD (internal cardiac defibrillator) procedure     Dr. Lovena Le 2009... by the pacing  . SOB (shortness of breath)     Large left effusion/ thoracentesis/hospitalization/November, 2011... exudated.. cytology negative.. Dr.Wert.. no proof of mesothelioma  . Pericardial effusion   . Pleural effusion     Large loculated effusion on the left side November, 2011. This was tapped. It was exudative. Cytology revealed no cancer  no proof of mesothelioma area pulmonary team felt that no further workup was needed  . S/P AAA repair   . Spinal stenosis     Surgery Dr.Elsner  . CAD (coronary artery disease)     Catheterization July, 2008... name and vein grafts patent but low cardiac output  . Warfarin anticoagulation   . Cardiomyopathy     Ischemic... ICD  . Combined systolic and diastolic CHF   . Venous insufficiency     Toe discoloration chronic  . Mitral regurgitation     Mild echo  . Aortic valve sclerosis   . Nasal drainage     Chronic  . Alcohol ingestion of more than four drinks per week     Excess beer  not a dependency problem  . Chronotropic incompetence     IV pacing rate adjusted  . Thrombophlebitis of superficial veins of upper extremities     Possible venous stenosis from defibrillator  . Pericardial effusion     November, 2011 .. decreased during hospitalization  . Eye abnormality     Ophthalmologist questions a clot in one of his eyes, May, 2012  . Overweight(278.02)     November, 2012  . Pleural thickening   . Ejection fraction     Ejection fraction has varied over time from 35-50%.,, Echoes are technically very difficult,,, EF 50%, echo, May 25, 2011, technically very difficult  . Drug therapy  Redness and swelling with Avelox infusion May 24, 2011  . COPD (chronic obstructive pulmonary disease)   . Myocardial infarction   . Spinal cord stimulator status     October, 2013  . Carotid artery disease     Doppler, December, 2013, 0-39% bilateral  . Pneumonia   . Arthritis   . Ventral hernia     April, 2014, result of his abdominal surgery  . Bony abnormality     Patient's manubrium is slightly displaced to the right  . Ascending aortic aneurysm 05/10/2014    Past Surgical History  Procedure Laterality Date  . Colonoscopy w/ polypectomy    . Lumbar fusion    . Pilonidal cyst removal    . Surgery scrotal / testicular    . Coronary artery bypass graft  2004  . Abdominal  aortic aneurysm repair  11/2002  . Bi-ventricular pacemaker insertion (crt-p)  02-11-2013    Pt with previously implanted MDT CRTD downgraded to CRTP by Dr Lovena Le 02-11-13  . Back surgery    . Incision and drainage abscess / hematoma of bursa / knee / thigh    . Implantable cardioverter defibrillator (icd) generator change N/A 02/11/2013    Procedure: ICD GENERATOR CHANGE;  Surgeon: Evans Lance, MD;  Location: [facility] CATH LAB;  Service: Cardiovascular;  Laterality: N/A;    Patient Active Problem List   Diagnosis Date Noted  . Ascending aortic aneurysm 05/10/2014  . Hypoxia 05/05/2014  . COPD exacerbation 05/05/2014  . Diabetes 05/05/2014  . Chronic systolic CHF (congestive heart failure) 03/07/2014  . History of DVT (deep vein thrombosis) 03/07/2014  . Cardiomyopathy, ischemic 02/05/2014  . Pleural plaque without asbestosis 01/23/2014  . Elevated hemoglobin 01/14/2014  . CKD (chronic kidney disease) stage 3, GFR 30-59 ml/min 01/14/2014  . Asbestos exposure 01/14/2014  . Shortness of breath 01/01/2014  . Abscess, scrotum 03/05/2013  . Ejection fraction   . Hypopotassemia 12/20/2012  . ACE-inhibitor cough 10/04/2012  . Ventral hernia   . Bony abnormality   . Carotid artery disease   . Spinal cord stimulator status   . 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 Atrial fibrillation   . Spinal stenosis   . CAD (coronary artery disease)   . Venous insufficiency   . Mitral regurgitation   . Aortic valve sclerosis   . Alcohol ingestion of more than four drinks per week   . Chronotropic incompetence   . Thrombophlebitis of superficial veins of upper extremities   . Pericardial effusion   . Overweight   . Pleural effusion   . Drug therapy   . Hyperlipidemia   . Hypertension   . Atrial flutter   . Left atrial thrombus   . AAA (abdominal aortic aneurysm)   . S/P ICD (internal cardiac defibrillator) procedure   . Warfarin anticoagulation   . Diabetic  polyneuropathy 01/07/2010  . DIVERTICULOSIS, COLON 06/10/2009  . Uncontrolled diabetes mellitus with peripheral circulatory disorder 03/11/2009  . BACK PAIN, LUMBAR 06/12/2008  . 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 Acute gout 07/27/2006  . CORONARY ARTERY BYPASS GRAFT, HX OF 07/27/2006      Current Outpatient Prescriptions  Medication Sig Dispense Refill  . ACCU-CHEK FASTCLIX LANCETS MISC USE ONE LANCET TO CHECK BLOOD SUGAR THREE TIMES DAILY 102 each 5  . ACCU-CHEK SMARTVIEW test strip     . acetaminophen (TYLENOL) 325 MG tablet Take 975 mg by mouth every morning.    Marland Kitchen allopurinol (ZYLOPRIM) 100 MG tablet  Take 1 tablet (100 mg total) by mouth daily. 90 tablet 3  . amLODipine (NORVASC) 5 MG tablet Take 2.5 mg by mouth daily.    Marland Kitchen aspirin 81 MG tablet Take 81 mg by mouth once a week.     Marland Kitchen atorvastatin (LIPITOR) 10 MG tablet Take 1 tablet (10 mg total) by mouth daily. 90 tablet 3  . carboxymethylcellulose (REFRESH PLUS) 0.5 % SOLN Place 2 drops into both eyes daily.     . carvedilol (COREG) 6.25 MG tablet TAKE ONE TABLET BY MOUTH TWICE DAILY 180 tablet 1  . chlorthalidone (HYGROTON) 25 MG tablet Take when weight reaches 244 lbs. or greater only 90 tablet 3  . Cholecalciferol 10000 UNITS CAPS Take 1,000 Units by mouth daily.    . furosemide (LASIX) 80 MG tablet Take 1 tablet (80 mg total) by mouth 2 (two) times daily. 90 tablet 3  . gabapentin (NEURONTIN) 300 MG capsule 3 x daily as needed for leg pain 270 capsule 3  . insulin lispro (HUMALOG KWIKPEN) 100 UNIT/ML KiwkPen Inject 0.12-0.14 mLs (12-14 Units total) into the skin 2 (two) times daily. 5 pen 3  . ipratropium (ATROVENT) 0.06 % nasal spray Place 1 spray into both nostrils as directed.     Marland Kitchen LEVEMIR FLEXTOUCH 100 UNIT/ML Pen INJECT 35 UNITS INTO THE SKIN EVERY NIGHT AT BEDTIME 15 mL 5  . losartan (COZAAR) 50 MG tablet Take 1 tablet (50 mg total) by mouth daily. 30 tablet 3  . metFORMIN (GLUCOPHAGE-XR) 750 MG 24  hr tablet TAKE ONE TABLET BY MOUTH ONCE DAILY WITH BREAKFAST 90 tablet 1  . naphazoline-pheniramine (NAPHCON-A) 0.025-0.3 % ophthalmic solution Place 1 drop into both eyes daily as needed for irritation.    . nitroGLYCERIN (NITROSTAT) 0.4 MG SL tablet Place 0.4 mg under the tongue every 5 (five) minutes as needed for chest pain (MAX 3 TABLETS).     Marland Kitchen OVER THE COUNTER MEDICATION Take by mouth as needed (Cough).    . Potassium Chloride ER 20 MEQ TBCR Take 30 mcg by mouth 2 (two) times daily. 60 tablet   . warfarin (COUMADIN) 2.5 MG tablet Take 2.5 mg daily except Wednesday, take 5 mg. 130 tablet 0   No current facility-administered medications for this visit.    Allergies:   Avelox and Penicillins    Social History:  The patient  reports that he quit smoking about 19 years ago. His smoking use included Cigarettes. He started smoking about 58 years ago. He has a 117 pack-year smoking history. He has never used smokeless tobacco. He reports that he drinks about 1.2 oz of alcohol per week. He reports that he does not use illicit drugs.   Family History:  The patient's family history includes Coronary artery disease in his father; Diabetes in his father; Hypertension in his mother; Other in his father; Stroke in his mother.    ROS:  Please see the history of present illness.     Patient denies fever, chills, headache, sweats, rash, change in vision, change in hearing, chest pain, cough, nausea or vomiting, urnary symptoms. All other systems are reviewed and are negative.    PHYSICAL EXAM: VS:  BP 116/74 mmHg  Pulse 49  Ht 6' (1.829 m)  Wt 251 lb 9.6 oz (114.125 kg)  BMI 34.12 kg/m2  SpO2 97% ,  patient is here with his wife as always. He is oriented to person time and place. Affect is normal. Head is atraumatic. Sclera and conjunctiva are normal.  There is no jugulovenous distention. Lungs are clear. Respiratory effort is nonlabored. Cardiac exam reveals S1 and S2.  The abdomen is protuberant as  always. There is trace peripheral edema. There are no musculoskeletal deformities. There are no skin rashes. Neurologic is grossly intact.  EKG:    EKG is not done today.   Recent Labs: 07/08/2013: TSH 0.69 04/22/2014: ALT 15 05/05/2014: B Natriuretic Peptide 607.3* 05/08/2014: Magnesium 2.7* 05/14/2014: Hemoglobin 15.4; Platelets 299.0; Pro B Natriuretic peptide (BNP) 670.0* 06/23/2014: BUN 33*; Creatinine 1.69*; Potassium 3.6; Sodium 139    Lipid Panel    Component Value Date/Time   CHOL 172 03/03/2014 1134   TRIG 106.0 03/03/2014 1134   HDL 42.20 03/03/2014 1134   CHOLHDL 4 03/03/2014 1134   VLDL 21.2 03/03/2014 1134   LDLCALC 109* 03/03/2014 1134      Wt Readings from Last 3 Encounters:  06/27/14 251 lb 9.6 oz (114.125 kg)  06/06/14 249 lb (112.946 kg)  06/05/14 250 lb (113.399 kg)      Current medicines are reviewed   The patient and his wife understand his medications well.     ASSESSMENT AND PLAN:

## 2014-07-02 ENCOUNTER — Telehealth: Payer: Self-pay | Admitting: Cardiology

## 2014-07-02 NOTE — Telephone Encounter (Signed)
Follow Up ° °Pt returned call//  °

## 2014-07-03 NOTE — Telephone Encounter (Signed)
The pts wife called to give the pts daily weight.

## 2014-07-07 ENCOUNTER — Telehealth: Payer: Self-pay | Admitting: Hematology & Oncology

## 2014-07-07 ENCOUNTER — Telehealth: Payer: Self-pay | Admitting: *Deleted

## 2014-07-07 ENCOUNTER — Telehealth: Payer: Self-pay

## 2014-07-07 NOTE — Telephone Encounter (Signed)
Wife called back scheduled 5-9 appointment refused to let pt see NP and would not take any other times. They are going on vacation for 2 weeks. RN aware

## 2014-07-07 NOTE — Telephone Encounter (Signed)
I left message on the pts VM on 4/14 of the following (see lab results from 06/27/14):  The pts wife is advised that the pts Hemoglobin level is drifting back up and that [name] wants them to contact Dr Dicie Beam office to schedule f/u with the phlebotomy team.   Joaquim Lai verbalized understanding and states that they did receive my VM message that I left for the pt on 4/14 and that she has already contacted Dr Dicie Beam office and left a message for someone to call them back.

## 2014-07-07 NOTE — Telephone Encounter (Signed)
Received call from wife pt might need phlebotomy per cardiologist. I called back and left message for them to call and speak to RN. I also let Roselyn Reef RN know that she may want to call pt.

## 2014-07-07 NOTE — Telephone Encounter (Signed)
06/20/14  Weight= 242 lbs.  The pts wife, [name], states that the pt has continued swelling in right leg and mils sob at this time. 06/21/14  Weight= 244 lbs.  06/22/14  Weight= 242 lbs.  [name] reports no changes in the pts edema or SOB status at this time.  06/23/14  Weight= 240 lbs.  [name] reports no changes in the pts edema or SOB status at this time. 06/24/14  Weight= 243 lbs.  [name] reports no changes in the pts edema or SOB status at this time. 06/25/14  Weight= 245 lbs.  [name] reports no changes in the pts edema or SOB status at this time. The pt took a Chlorthalidone 12.5 mg dose today due to weight greater than 244 lbs. 06/26/14  Weight= 244 lbs.  [name] reports no changes in the pts edema or SOB status at this time. The pt took a Chlorthalidone 12.5 mg dose today due to weight of 244 lbs. 06/27/14  Weight= 243 lbs.  The pt had an OV today with [name] today. No changes were made. 06/28/14  Weight= 246 lbs.  [name] reports no changes in the pts edema or SOB status at this time. The pt took a Chlorthalidone 12.5 mg dose today due to weight greater than 244 lbs. 06/29/14 Weight=  242 lbs.   [name] reports no changes in the pts edema or SOB status at this time. 06/30/14  Weight= 242 lbs.   [name] reports no changes in the pts edema or SOB status at this time. 07/01/14  Weight= 244 lbs.  [name] reports no changes in the pts edema or SOB status at this time. The pt took a Chlorthalidone 12.5 mg dose today due to weight of 244 lbs. 07/02/14  Weight= 242 lbs.  [name] reports no changes in the pts edema or SOB status at this time. 07/03/14  Weight= 242 lbs.  [name] reports no changes in the pts edema or SOB status at this time.

## 2014-07-07 NOTE — Telephone Encounter (Signed)
New MEssage  Pt wife called, wanted to speak w/ Rn; was not specific. Please call back and discuss.

## 2014-07-07 NOTE — Telephone Encounter (Signed)
Patient's wife called stating that they had blood work done at their cardiologist and felt like the patient needed a phlebotomy. Labs reviewed with Dr Marin Olp. Dr Marin Olp had last seen patient in January. He was supposed to see him 6 weeks later, but they cancelled due to patient being in the hospital. They never rescheduled. Dr Marin Olp would like to bring patient in for an appointment and possible phlebotomy at that time. Called patient back, no answer, message left to call the office back. Scheduler also aware.

## 2014-07-08 ENCOUNTER — Encounter: Payer: Self-pay | Admitting: Physician Assistant

## 2014-07-22 ENCOUNTER — Telehealth: Payer: Self-pay

## 2014-07-22 NOTE — Telephone Encounter (Signed)
07/04/14  Weight= 241 lbs.  The pts wife, Joaquim Lai, states that the pts right leg continues to be swollen and that he has mild SOB at this time. 07/05/14  Weight= 243 lbs.  The pt reports no changes in his right leg edema (no other edema noted) or his SOB status at this time. 07/06/14  Weight= 242 lbs.  The pts wife, Joaquim Lai, states that the pts right leg continues to be swollen and that he has mild exertional SOB at this time. 07/07/14  Weight= 246 lbs.  The pts wife, Joaquim Lai, states that the pts right leg continues to be swollen and that he has mild exertional SOB at this time.  The pt took a Chlorthalidone dose today due to weight greater than 244 lbs. 07/08/14  Weight= 246 lbs.   The pts wife, Joaquim Lai, states that the pts right leg continues to be swollen and that he has mild exertional SOB at this time.  The pt took a Chlorthalidone dose today due to weight greater than 244 lbs. 07/09/14  Weight= 246 lbs.   The pts wife, Joaquim Lai, states that the pts right leg continues to be swollen and that he has mild exertional SOB at this time.  The pt took a Chlorthalidone dose today due to weight greater than 244 lbs. 07/10/14  Weight= 243 lbs.  The pts wife, Joaquim Lai, states that the pts right leg continues to be swollen and that he has mild exertional SOB at this time.   07/11/14  Weight= 243 lbs.  The pts wife, Joaquim Lai, states that the pts right leg continues to be swollen and that he has mild exertional SOB at this time.   The pt took a Chlorthalidone dose today due to weight greater than 244 lbs.

## 2014-07-28 ENCOUNTER — Ambulatory Visit (HOSPITAL_BASED_OUTPATIENT_CLINIC_OR_DEPARTMENT_OTHER): Payer: Medicare Other

## 2014-07-28 ENCOUNTER — Encounter: Payer: Medicare Other | Admitting: Internal Medicine

## 2014-07-28 ENCOUNTER — Encounter: Payer: Self-pay | Admitting: Hematology & Oncology

## 2014-07-28 ENCOUNTER — Other Ambulatory Visit: Payer: Self-pay | Admitting: *Deleted

## 2014-07-28 ENCOUNTER — Encounter: Payer: Self-pay | Admitting: Internal Medicine

## 2014-07-28 ENCOUNTER — Other Ambulatory Visit (HOSPITAL_BASED_OUTPATIENT_CLINIC_OR_DEPARTMENT_OTHER): Payer: Medicare Other

## 2014-07-28 ENCOUNTER — Ambulatory Visit (HOSPITAL_BASED_OUTPATIENT_CLINIC_OR_DEPARTMENT_OTHER): Payer: Medicare Other | Admitting: Hematology & Oncology

## 2014-07-28 VITALS — BP 117/72 | HR 59 | Resp 18

## 2014-07-28 VITALS — BP 121/70 | HR 85 | Temp 97.6°F | Resp 20 | Ht 72.0 in | Wt 248.0 lb

## 2014-07-28 DIAGNOSIS — D582 Other hemoglobinopathies: Secondary | ICD-10-CM

## 2014-07-28 DIAGNOSIS — D45 Polycythemia vera: Secondary | ICD-10-CM

## 2014-07-28 DIAGNOSIS — D564 Hereditary persistence of fetal hemoglobin [HPFH]: Secondary | ICD-10-CM | POA: Diagnosis not present

## 2014-07-28 HISTORY — DX: Polycythemia vera: D45

## 2014-07-28 LAB — COMPREHENSIVE METABOLIC PANEL
ALK PHOS: 58 U/L (ref 39–117)
ALT: 11 U/L (ref 0–53)
AST: 14 U/L (ref 0–37)
Albumin: 3.6 g/dL (ref 3.5–5.2)
BILIRUBIN TOTAL: 0.6 mg/dL (ref 0.2–1.2)
BUN: 31 mg/dL — ABNORMAL HIGH (ref 6–23)
CO2: 29 meq/L (ref 19–32)
CREATININE: 1.73 mg/dL — AB (ref 0.50–1.35)
Calcium: 9.6 mg/dL (ref 8.4–10.5)
Chloride: 99 mEq/L (ref 96–112)
Glucose, Bld: 138 mg/dL — ABNORMAL HIGH (ref 70–99)
Potassium: 3.7 mEq/L (ref 3.5–5.3)
Sodium: 139 mEq/L (ref 135–145)
Total Protein: 6.6 g/dL (ref 6.0–8.3)

## 2014-07-28 LAB — CBC WITH DIFFERENTIAL (CANCER CENTER ONLY)
BASO#: 0 10*3/uL (ref 0.0–0.2)
BASO%: 0.4 % (ref 0.0–2.0)
EOS%: 2.1 % (ref 0.0–7.0)
Eosinophils Absolute: 0.1 10*3/uL (ref 0.0–0.5)
HEMATOCRIT: 48.8 % (ref 38.7–49.9)
HEMOGLOBIN: 16.6 g/dL (ref 13.0–17.1)
LYMPH#: 1.1 10*3/uL (ref 0.9–3.3)
LYMPH%: 17 % (ref 14.0–48.0)
MCH: 30.7 pg (ref 28.0–33.4)
MCHC: 34 g/dL (ref 32.0–35.9)
MCV: 90 fL (ref 82–98)
MONO#: 0.7 10*3/uL (ref 0.1–0.9)
MONO%: 10.4 % (ref 0.0–13.0)
NEUT%: 70.1 % (ref 40.0–80.0)
NEUTROS ABS: 4.7 10*3/uL (ref 1.5–6.5)
Platelets: 164 10*3/uL (ref 145–400)
RBC: 5.41 10*6/uL (ref 4.20–5.70)
RDW: 14.8 % (ref 11.1–15.7)
WBC: 6.7 10*3/uL (ref 4.0–10.0)

## 2014-07-28 LAB — IRON AND TIBC CHCC
%SAT: 27 % (ref 20–55)
Iron: 101 ug/dL (ref 42–163)
TIBC: 375 ug/dL (ref 202–409)
UIBC: 274 ug/dL (ref 117–376)

## 2014-07-28 LAB — FERRITIN CHCC: FERRITIN: 62 ng/mL (ref 22–316)

## 2014-07-28 NOTE — Progress Notes (Signed)
Hematology and Oncology Follow Up Visit  [name] [id number] 04-27-1940 74 y.o. 07/28/2014   Principle Diagnosis:   Polycythemia vera  Current Therapy:    Phlebotomy to maintain hematocrit below 45%.  Aspirin 81 mg by mouth daily     Interim History:  [name] is back for follow-up. I actually saw him in the hospital a few weeks ago. He was in there with issues from bronchitis. He then had a gouty attack.  He gets phlebotomized and feels better.  He has had no problems with headache. He's had no problems with nausea vomiting. He does have some shortness of breath.  He's had no pruritus. He and his wife have a beach house. They get on there all the time. He is headed back down there for [facility] Day.   Medications:  Current outpatient prescriptions:  .  ACCU-CHEK FASTCLIX LANCETS MISC, USE ONE LANCET TO CHECK BLOOD SUGAR THREE TIMES DAILY, Disp: 102 each, Rfl: 5 .  ACCU-CHEK SMARTVIEW test strip, , Disp: , Rfl:  .  acetaminophen (TYLENOL) 325 MG tablet, Take 975 mg by mouth every morning., Disp: , Rfl:  .  allopurinol (ZYLOPRIM) 100 MG tablet, Take 1 tablet (100 mg total) by mouth daily., Disp: 90 tablet, Rfl: 3 .  amLODipine (NORVASC) 5 MG tablet, Take 2.5 mg by mouth daily., Disp: , Rfl:  .  aspirin 81 MG tablet, Take 81 mg by mouth once a week. , Disp: , Rfl:  .  atorvastatin (LIPITOR) 10 MG tablet, Take 1 tablet (10 mg total) by mouth daily., Disp: 90 tablet, Rfl: 3 .  carboxymethylcellulose (REFRESH PLUS) 0.5 % SOLN, Place 2 drops into both eyes daily. , Disp: , Rfl:  .  carvedilol (COREG) 6.25 MG tablet, TAKE ONE TABLET BY MOUTH TWICE DAILY, Disp: 180 tablet, Rfl: 1 .  chlorthalidone (HYGROTON) 25 MG tablet, Take when weight reaches 244 lbs. or greater only, Disp: 90 tablet, Rfl: 3 .  Cholecalciferol 10000 UNITS CAPS, Take 1,000 Units by mouth daily., Disp: , Rfl:  .  gabapentin (NEURONTIN) 300 MG capsule, 3 x daily as needed for leg pain, Disp: 270 capsule,  Rfl: 3 .  insulin lispro (HUMALOG KWIKPEN) 100 UNIT/ML KiwkPen, Inject 0.12-0.14 mLs (12-14 Units total) into the skin 2 (two) times daily., Disp: 5 pen, Rfl: 3 .  ipratropium (ATROVENT) 0.06 % nasal spray, Place 1 spray into both nostrils as directed. , Disp: , Rfl:  .  LEVEMIR FLEXTOUCH 100 UNIT/ML Pen, INJECT 35 UNITS INTO THE SKIN EVERY NIGHT AT BEDTIME, Disp: 15 mL, Rfl: 5 .  losartan (COZAAR) 50 MG tablet, Take 1 tablet (50 mg total) by mouth daily., Disp: 30 tablet, Rfl: 3 .  metFORMIN (GLUCOPHAGE-XR) 750 MG 24 hr tablet, TAKE ONE TABLET BY MOUTH ONCE DAILY WITH BREAKFAST, Disp: 90 tablet, Rfl: 1 .  naphazoline-pheniramine (NAPHCON-A) 0.025-0.3 % ophthalmic solution, Place 1 drop into both eyes daily as needed for irritation., Disp: , Rfl:  .  nitroGLYCERIN (NITROSTAT) 0.4 MG SL tablet, Place 0.4 mg under the tongue every 5 (five) minutes as needed for chest pain (MAX 3 TABLETS). , Disp: , Rfl:  .  Potassium Chloride ER 20 MEQ TBCR, Take 30 mcg by mouth 2 (two) times daily., Disp: 60 tablet, Rfl:  .  warfarin (COUMADIN) 2.5 MG tablet, Take 2.5 mg daily except Wednesday, take 5 mg., Disp: 130 tablet, Rfl: 0 .  furosemide (LASIX) 80 MG tablet, Take 1 tablet (80 mg total) by mouth 2 (two)  times daily., Disp: 90 tablet, Rfl: 3  Allergies:  Allergies  Allergen Reactions  . Avelox [Moxifloxacin Hcl In Nacl] Swelling, Rash and Other (See Comments)    Patient became hypotensive after infusion started Because of a history of documented adverse serious drug reaction;Medi Alert bracelet  is recommended  . Penicillins Anaphylaxis    REACTION: anaphylaxis Because of a history of documented adverse serious drug reaction;Medi Alert bracelet  is recommended    Past Medical History, Surgical history, Social history, and Family History were reviewed and updated.  Review of Systems: As above  Physical Exam:  height is 6' (1.829 m) and weight is 248 lb (112.492 kg). His oral temperature is 97.6 F  (36.4 C). His blood pressure is 121/70 and his pulse is 85. His respiration is 20.   Well-developed and well-nourished white [name]. He is moderately obese. Head and neck exam shows no ocular or oral lesions. There is less conjunctival inflammation. There is no adenopathy in his neck. Lungs are clear. He has no rales, wheezes or rhonchi. Cardiac exam regular rate and rhythm with no murmurs, rubs or bruits. Abdomen is soft. He has good bowel sounds. He is somewhat obese. There is no palpable liver or spleen tip. Back exam no tenderness over the spine, ribs or hips. Extremities shows some chronic mild nonpitting edema of the legs. Skin exam no rashes. He still has a little bit of a ruddy complexion. Neurological exam is nonfocal.  Lab Results  Component Value Date   WBC 6.7 07/28/2014   HGB 16.6 07/28/2014   HCT 48.8 07/28/2014   MCV 90 07/28/2014   PLT 164 07/28/2014     Chemistry      Component Value Date/Time   NA 138 06/27/2014 1153   NA 144 04/22/2014 1350   K 4.1 06/27/2014 1153   K 3.9 04/22/2014 1350   CL 101 06/27/2014 1153   CL 100 04/22/2014 1350   CO2 28 06/27/2014 1153   CO2 29 04/22/2014 1350   BUN 27* 06/27/2014 1153   BUN 30* 04/22/2014 1350   CREATININE 1.44 06/27/2014 1153   CREATININE 1.6* 04/22/2014 1350      Component Value Date/Time   CALCIUM 9.5 06/27/2014 1153   CALCIUM 9.1 04/22/2014 1350   ALKPHOS 55 04/22/2014 1350   ALKPHOS 62 03/03/2014 1134   AST 16 04/22/2014 1350   AST 13 03/03/2014 1134   ALT 15 04/22/2014 1350   ALT 8 03/03/2014 1134   BILITOT 1.00 04/22/2014 1350   BILITOT 0.7 03/03/2014 1134         Impression and Plan: [name] is 74 year old woman with polycythemia. Actually believe that he does have polycythemia. Again, his studies are equivocal but clinically, I would say polycythemia is probable.  I still don't think we have to do a bone marrow test on him.  We will get him phlebotomized today to achieve his hematocrit below  45%.  We will then have him come back in a couple weeks so that we can see what his blood looks like before he goes to the beach.    Volanda Napoleon, MD 5/9/201610:30 AM

## 2014-07-28 NOTE — Patient Instructions (Signed)

## 2014-07-28 NOTE — Progress Notes (Signed)
[name] presents today for phlebotomy per MD orders. Phlebotomy procedure started at 0920 and ended at [phone]. Site clotted off before full 545ml drained. Dr Marin Olp aware. He states this is fine and not to re-try patient. 300 mls removed. Patient observed for 30 minutes after procedure without any incident. Patient tolerated procedure well. IV needle removed intact.

## 2014-08-05 ENCOUNTER — Encounter: Payer: Medicare Other | Admitting: Internal Medicine

## 2014-08-06 DIAGNOSIS — L723 Sebaceous cyst: Secondary | ICD-10-CM | POA: Diagnosis not present

## 2014-08-06 DIAGNOSIS — L570 Actinic keratosis: Secondary | ICD-10-CM | POA: Diagnosis not present

## 2014-08-06 DIAGNOSIS — L821 Other seborrheic keratosis: Secondary | ICD-10-CM | POA: Diagnosis not present

## 2014-08-06 DIAGNOSIS — L814 Other melanin hyperpigmentation: Secondary | ICD-10-CM | POA: Diagnosis not present

## 2014-08-07 NOTE — Telephone Encounter (Signed)
07/12/14  Weight= 243 lbs  The pts wife states that the pt continues to have right leg edema and exertional SOB at this time. 07/13/14  Weight= 243 lbs.  The pt reports no change in his right leg edema or his exertional SOB at this time. 07/14/14  Weight= 243 lbs.  The pts wife states that the pt continues to have right leg edema and exertional SOB at this time. 07/15/14  Weight= 244 lbs.  The pts wife states that the pt continues to have right leg edema and exertional SOB at this time. The pt took a Chlorthalidone dose today. 07/16/14  Weight= 244 lbs.  The pts wife states that the pt continues to have right leg edema and exertional SOB at this time. The pt took a Chlorthalidone dose today. 07/17/14  Weight= 243 lbs.  The pt reports no change in his right leg edema or his exertional SOB at this time. 07/18/14  Weight= 245 lbs.  The pt reports no change in his right leg edema or his exertional SOB at this time. 07/19/14  Weight= 243 lbs. The pts wife states that the pt continues to have right leg edema and exertional SOB at this time.

## 2014-08-08 ENCOUNTER — Encounter: Payer: Self-pay | Admitting: Cardiology

## 2014-08-08 ENCOUNTER — Ambulatory Visit (INDEPENDENT_AMBULATORY_CARE_PROVIDER_SITE_OTHER): Payer: Medicare Other

## 2014-08-08 ENCOUNTER — Ambulatory Visit (INDEPENDENT_AMBULATORY_CARE_PROVIDER_SITE_OTHER): Payer: Medicare Other | Admitting: Cardiology

## 2014-08-08 VITALS — BP 128/70 | HR 78 | Ht 72.0 in | Wt 251.0 lb

## 2014-08-08 DIAGNOSIS — I158 Other secondary hypertension: Secondary | ICD-10-CM

## 2014-08-08 DIAGNOSIS — N183 Chronic kidney disease, stage 3 unspecified: Secondary | ICD-10-CM

## 2014-08-08 DIAGNOSIS — I255 Ischemic cardiomyopathy: Secondary | ICD-10-CM | POA: Diagnosis not present

## 2014-08-08 DIAGNOSIS — I4891 Unspecified atrial fibrillation: Secondary | ICD-10-CM

## 2014-08-08 DIAGNOSIS — I5022 Chronic systolic (congestive) heart failure: Secondary | ICD-10-CM | POA: Diagnosis not present

## 2014-08-08 DIAGNOSIS — Z7901 Long term (current) use of anticoagulants: Secondary | ICD-10-CM

## 2014-08-08 LAB — BASIC METABOLIC PANEL
BUN: 33 mg/dL — AB (ref 6–23)
CALCIUM: 9.7 mg/dL (ref 8.4–10.5)
CO2: 30 meq/L (ref 19–32)
Chloride: 99 mEq/L (ref 96–112)
Creatinine, Ser: 1.49 mg/dL (ref 0.40–1.50)
GFR: 49.09 mL/min — ABNORMAL LOW (ref >60.00)
GLUCOSE: 144 mg/dL — AB (ref 70–99)
Potassium: 3.8 mEq/L (ref 3.5–5.1)
Sodium: 138 mEq/L (ref 135–145)

## 2014-08-08 LAB — POCT INR: INR: 2.7

## 2014-08-08 NOTE — Progress Notes (Signed)
Cardiology Office Note   Date:  08/08/2014   ID:  [name], DOB 25-Dec-1940, MRN [id number]  PCP:  [name], MD  Cardiologist:  Dola Argyle, MD   Chief Complaint  Patient presents with  . Appointment    Follow-up cardiopathy   follow up coronary disease and cardiomyopathy.    History of Present Illness: [name] is a 74 y.o. male who presents today to follow up coronary disease and cardiomyopathy. He actually is feeling well. His volume status is controlled. He is not having any significant chest pain or shortness of breath.  He has undergone phlebotomy on 3 occasions for an elevated hemoglobin. This does seem to help his shortness of breath.    Past Medical History  Diagnosis Date  . Diabetes mellitus   . Hyperlipidemia   . Hypertension   . Pilonidal cyst   . Atrial fibrillation     Previous long-term amiodarone therapy with multiple cardioversions / amiodarone stopped September, 2009  . Atrial flutter     Started November, 2010, Left-sided and cannot ablate  . Left atrial thrombus     Remote past... cardioversions done since that time  . Wide-complex tachycardia   . Left ventricular ejection fraction less than 40%   . Gout   . AAA (abdominal aortic aneurysm)     Surgical repair  . Discolored skin   . S/P ICD (internal cardiac defibrillator) procedure     Dr. Lovena Le 2009... by the pacing  . SOB (shortness of breath)     Large left effusion/ thoracentesis/hospitalization/November, 2011... exudated.. cytology negative.. Dr.Wert.. no proof of mesothelioma  . Pericardial effusion   . Pleural effusion     Large loculated effusion on the left side November, 2011. This was tapped. It was exudative. Cytology revealed no cancer no proof of mesothelioma area pulmonary team felt that no further workup was needed  . S/P AAA repair   . Spinal stenosis     Surgery Dr.Elsner  . CAD (coronary artery disease)     Catheterization July, 2008... name and  vein grafts patent but low cardiac output  . Warfarin anticoagulation   . Cardiomyopathy     Ischemic... ICD  . Combined systolic and diastolic CHF   . Venous insufficiency     Toe discoloration chronic  . Mitral regurgitation     Mild echo  . Aortic valve sclerosis   . Nasal drainage     Chronic  . Alcohol ingestion of more than four drinks per week     Excess beer  not a dependency problem  . Chronotropic incompetence     IV pacing rate adjusted  . Thrombophlebitis of superficial veins of upper extremities     Possible venous stenosis from defibrillator  . Pericardial effusion     November, 2011 .. decreased during hospitalization  . Eye abnormality     Ophthalmologist questions a clot in one of his eyes, May, 2012  . Overweight(278.02)     November, 2012  . Pleural thickening   . Ejection fraction     Ejection fraction has varied over time from 35-50%.,, Echoes are technically very difficult,,, EF 50%, echo, May 25, 2011, technically very difficult  . Drug therapy     Redness and swelling with Avelox infusion May 24, 2011  . COPD (chronic obstructive pulmonary disease)   . Myocardial infarction   . Spinal cord stimulator status     October, 2013  . Carotid artery disease  Doppler, December, 2013, 0-39% bilateral  . Pneumonia   . Arthritis   . Ventral hernia     April, 2014, result of his abdominal surgery  . Bony abnormality     Patient's manubrium is slightly displaced to the right  . Ascending aortic aneurysm 05/10/2014  . 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 Internal hemorrhoids   . Diverticulosis   . Adenomatous colon polyp     Past Surgical History  Procedure Laterality Date  . Colonoscopy w/ polypectomy    . Lumbar fusion    . Pilonidal cyst removal    . Surgery scrotal / testicular    . Coronary artery bypass graft  2004  . Abdominal aortic aneurysm repair  11/2002  . Bi-ventricular pacemaker insertion (crt-p)  02-11-2013    Pt with previously implanted  MDT CRTD downgraded to CRTP by Dr Lovena Le 02-11-13  . Back surgery    . Incision and drainage abscess / hematoma of bursa / knee / thigh    . Implantable cardioverter defibrillator (icd) generator change N/A 02/11/2013    Procedure: ICD GENERATOR CHANGE;  Surgeon: Evans Lance, MD;  Location: [facility] CATH LAB;  Service: Cardiovascular;  Laterality: N/A;    Patient Active Problem List   Diagnosis Date Noted  . 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 Ascending aortic aneurysm 05/10/2014  . Hypoxia 05/05/2014  . COPD exacerbation 05/05/2014  . Diabetes 05/05/2014  . Chronic systolic CHF (congestive heart failure) 03/07/2014  . History of DVT (deep vein thrombosis) 03/07/2014  . Cardiomyopathy, ischemic 02/05/2014  . Pleural plaque without asbestosis 01/23/2014  . Elevated hemoglobin 01/14/2014  . CKD (chronic kidney disease) stage 3, GFR 30-59 ml/min 01/14/2014  . Asbestos exposure 01/14/2014  . Shortness of breath 01/01/2014  . Abscess, scrotum 03/05/2013  . Ejection fraction   . Hypopotassemia 12/20/2012  . ACE-inhibitor cough 10/04/2012  . Ventral hernia   . Bony abnormality   . Carotid artery disease   . Spinal cord stimulator status   . 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 Atrial fibrillation   . Spinal stenosis   . CAD (coronary artery disease)   . Venous insufficiency   . Mitral regurgitation   . Aortic valve sclerosis   . Alcohol ingestion of more than four drinks per week   . Chronotropic incompetence   . Thrombophlebitis of superficial veins of upper extremities   . Pericardial effusion   . Overweight(278.02)   . Pleural effusion   . Drug therapy   . Hyperlipidemia   . Hypertension   . Atrial flutter   . Left atrial thrombus   . AAA (abdominal aortic aneurysm)   . S/P ICD (internal cardiac defibrillator) procedure   . Warfarin anticoagulation   . Diabetic polyneuropathy 01/07/2010  . DIVERTICULOSIS, COLON 06/10/2009  . Uncontrolled diabetes  mellitus with peripheral circulatory disorder 03/11/2009  . BACK PAIN, LUMBAR 06/12/2008  . 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 Acute gout 07/27/2006  . CORONARY ARTERY BYPASS GRAFT, HX OF 07/27/2006      Current Outpatient Prescriptions  Medication Sig Dispense Refill  . ACCU-CHEK FASTCLIX LANCETS MISC USE ONE LANCET TO CHECK BLOOD SUGAR THREE TIMES DAILY 102 each 5  . ACCU-CHEK SMARTVIEW test strip     . acetaminophen (TYLENOL) 325 MG tablet Take 975 mg by mouth every morning.    Marland Kitchen allopurinol (ZYLOPRIM) 100 MG tablet Take 1 tablet (100 mg total) by mouth daily. 90 tablet 3  . amLODipine (NORVASC) 5 MG  tablet Take 2.5 mg by mouth daily.    Marland Kitchen aspirin 81 MG tablet Take 81 mg by mouth once a week.     Marland Kitchen atorvastatin (LIPITOR) 10 MG tablet Take 1 tablet (10 mg total) by mouth daily. 90 tablet 3  . carboxymethylcellulose (REFRESH PLUS) 0.5 % SOLN Place 2 drops into both eyes daily.     . carvedilol (COREG) 6.25 MG tablet TAKE ONE TABLET BY MOUTH TWICE DAILY 180 tablet 1  . chlorthalidone (HYGROTON) 25 MG tablet Take when weight reaches 244 lbs. or greater only 90 tablet 3  . Cholecalciferol 10000 UNITS CAPS Take 1,000 Units by mouth daily.    . furosemide (LASIX) 80 MG tablet Take 1 tablet (80 mg total) by mouth 2 (two) times daily. 90 tablet 3  . gabapentin (NEURONTIN) 300 MG capsule 3 x daily as needed for leg pain 270 capsule 3  . insulin lispro (HUMALOG KWIKPEN) 100 UNIT/ML KiwkPen Inject 0.12-0.14 mLs (12-14 Units total) into the skin 2 (two) times daily. 5 pen 3  . ipratropium (ATROVENT) 0.06 % nasal spray Place 1 spray into both nostrils as directed.     Marland Kitchen LEVEMIR FLEXTOUCH 100 UNIT/ML Pen INJECT 35 UNITS INTO THE SKIN EVERY NIGHT AT BEDTIME 15 mL 5  . losartan (COZAAR) 50 MG tablet Take 1 tablet (50 mg total) by mouth daily. 30 tablet 3  . metFORMIN (GLUCOPHAGE-XR) 750 MG 24 hr tablet TAKE ONE TABLET BY MOUTH ONCE DAILY WITH BREAKFAST 90 tablet 1  .  naphazoline-pheniramine (NAPHCON-A) 0.025-0.3 % ophthalmic solution Place 1 drop into both eyes daily as needed for irritation.    . nitroGLYCERIN (NITROSTAT) 0.4 MG SL tablet Place 0.4 mg under the tongue every 5 (five) minutes as needed for chest pain (MAX 3 TABLETS).     . Potassium Chloride ER 20 MEQ TBCR Take 30 mcg by mouth 2 (two) times daily. 60 tablet   . warfarin (COUMADIN) 2.5 MG tablet Take 2.5 mg daily except Wednesday, take 5 mg. 130 tablet 0   No current facility-administered medications for this visit.    Allergies:   Avelox and Penicillins    Social History:  The patient  reports that he quit smoking about 19 years ago. His smoking use included Cigarettes. He started smoking about 58 years ago. He has a 117 pack-year smoking history. He has never used smokeless tobacco. He reports that he drinks about 1.2 oz of alcohol per week. He reports that he does not use illicit drugs.   Family History:  The patient's family history includes Coronary artery disease in his father; Diabetes in his brother and father; Hypertension in his mother; Other in his father; Stroke in his mother. There is no history of Colon cancer.    ROS:  Please see the history of present illness.    Patient denies fever, chills, headache, sweats, rash, change in vision, change in hearing, chest pain, cough, nausea or vomiting, urinary symptoms. All other systems are reviewed and are negative.    PHYSICAL EXAM: VS:  BP 128/70 mmHg  Pulse 78  Ht 6' (1.829 m)  Wt 251 lb (113.853 kg)  BMI 34.03 kg/m2  SpO2 96% , Patient is oriented to person time and place. Affect is normal. He is here with his wife. Head is atraumatic. Sclera and conjunctiva are normal. There is no jugular venous distention. Lungs are clear. Respiratory effort is not labored. Cardiac exam reveals an S1 and S2. The abdomen is protuberant but soft. There is  no peripheral edema. There are no musculoskeletal deformities. He does have some ecchymoses  on his skin. Neurologic is grossly intact.  EKG:   EKG is not done today.   Recent Labs: 05/05/2014: B Natriuretic Peptide 607.3* 05/08/2014: Magnesium 2.7* 05/14/2014: Pro B Natriuretic peptide (BNP) 670.0* 07/28/2014: ALT 11; Hemoglobin 16.6; Platelets 164 08/08/2014: BUN 33*; Creatinine 1.49; Potassium 3.8; Sodium 138    Lipid Panel    Component Value Date/Time   CHOL 172 03/03/2014 1134   TRIG 106.0 03/03/2014 1134   HDL 42.20 03/03/2014 1134   CHOLHDL 4 03/03/2014 1134   VLDL 21.2 03/03/2014 1134   LDLCALC 109* 03/03/2014 1134      Wt Readings from Last 3 Encounters:  08/08/14 251 lb (113.853 kg)  07/28/14 248 lb (112.492 kg)  06/27/14 251 lb 9.6 oz (114.125 kg)      Current medicines are reviewed       ASSESSMENT AND PLAN:

## 2014-08-08 NOTE — Assessment & Plan Note (Signed)
He is on the medications that we are able to use successfully.

## 2014-08-08 NOTE — Assessment & Plan Note (Signed)
His volume status is stable. No change.

## 2014-08-08 NOTE — Patient Instructions (Signed)
**Note De-Identified [name] Obfuscation** Medication Instructions:  Same  Labwork: BMET today  Testing/Procedures: None  Follow-Up: Your physician recommends that you schedule a follow-up appointment in: July 2016

## 2014-08-08 NOTE — Assessment & Plan Note (Signed)
Most recent creatinine was up to 1.7. He generally ranges between 1.3 and 1.6. Repeat chemistry will be checked today.

## 2014-08-11 ENCOUNTER — Other Ambulatory Visit: Payer: Self-pay

## 2014-08-11 ENCOUNTER — Other Ambulatory Visit: Payer: Self-pay | Admitting: *Deleted

## 2014-08-11 ENCOUNTER — Ambulatory Visit (INDEPENDENT_AMBULATORY_CARE_PROVIDER_SITE_OTHER): Payer: Medicare Other | Admitting: Physician Assistant

## 2014-08-11 ENCOUNTER — Telehealth: Payer: Self-pay | Admitting: *Deleted

## 2014-08-11 ENCOUNTER — Encounter: Payer: Self-pay | Admitting: Physician Assistant

## 2014-08-11 VITALS — BP 100/58 | HR 68 | Ht 72.0 in | Wt 249.0 lb

## 2014-08-11 DIAGNOSIS — I481 Persistent atrial fibrillation: Secondary | ICD-10-CM | POA: Diagnosis not present

## 2014-08-11 DIAGNOSIS — Z794 Long term (current) use of insulin: Secondary | ICD-10-CM

## 2014-08-11 DIAGNOSIS — E119 Type 2 diabetes mellitus without complications: Secondary | ICD-10-CM | POA: Diagnosis not present

## 2014-08-11 DIAGNOSIS — Z86010 Personal history of colonic polyps: Secondary | ICD-10-CM

## 2014-08-11 DIAGNOSIS — Z7901 Long term (current) use of anticoagulants: Secondary | ICD-10-CM | POA: Diagnosis not present

## 2014-08-11 DIAGNOSIS — D45 Polycythemia vera: Secondary | ICD-10-CM

## 2014-08-11 DIAGNOSIS — I4819 Other persistent atrial fibrillation: Secondary | ICD-10-CM

## 2014-08-11 DIAGNOSIS — IMO0001 Reserved for inherently not codable concepts without codable children: Secondary | ICD-10-CM

## 2014-08-11 MED ORDER — LOSARTAN POTASSIUM 50 MG PO TABS: 50.0000 mg | ORAL_TABLET | Freq: Every day | ORAL | Status: DC

## 2014-08-11 NOTE — Telephone Encounter (Signed)
Patient advised that per [name], he may hold coumadin 5 days prior to procedure. Understanding verbalized.

## 2014-08-11 NOTE — Telephone Encounter (Signed)
08/11/2014  RE: ATWELL MCDANEL DOB: 01-01-1941 MRN: [id number]  Dear [name],   We have scheduled the above patient for a colonoscopy procedure. Our records show that he is on anticoagulation therapy.  Please advise as to whether the patient may come off his therapy of coumadin 5 days prior to the procedure, which is scheduled for 10/17/14. Please route your response to Dixon Boos, CMA  Sincerely,  Dixon Boos

## 2014-08-11 NOTE — Telephone Encounter (Signed)
It is okay for the patient to be off Coumadin for 5 days prior to his colonoscopy.

## 2014-08-11 NOTE — Progress Notes (Signed)
Reviewed and agree. OK to  Do in [place].

## 2014-08-11 NOTE — Progress Notes (Signed)
Patient ID: [name], male   DOB: 1940-11-19, 74 y.o.   MRN: [id number]    HPI:  [name] is a 74 y.o.   male who is here to discuss having a surveillance colonoscopy. He had a hyperplastic polyp on colonoscopy in August 2003. A CT of the abdomen in 2004 showed an abdominal aortic aneurysm which was subsequently repaired in April 2004. His last colonoscopy was in May 2011 at which time 2 polyps were found in the sigmoid to descending colon segments. 3 and 4 mm polyps were removed from 60 cm and 35 cm. Colon polyp at 60 cm was a serrated adenoma and the colon polyp at 35 cm was a tubular adenoma. No high-grade dysplasia or malignancy was identified on either polyp. He was advised to have surveillance in 5 years. He feels well and offers no complaints. He has been on chronic warfarin for atrial fibrillation. He has a history of coronary artery disease and cardiomyopathy. He was previously on long-term amiodarone therapy with multiple cardioversions. His amiodarone was stopped in September 2009. His left ventricular ejection fraction is less than 40%. He had an ICD placed in 2009. He has a history of combined systolic and diastolic CHF with venous insufficiency mild mitral regurgitation and aortic valve sclerosis. He also has a history of polycythemia vera and has recently had 3 phlebotomies.   Past Medical History  Diagnosis Date  . Diabetes mellitus   . Hyperlipidemia   . Hypertension   . Pilonidal cyst   . Atrial fibrillation     Previous long-term amiodarone therapy with multiple cardioversions / amiodarone stopped September, 2009  . Atrial flutter     Started November, 2010, Left-sided and cannot ablate  . Left atrial thrombus     Remote past... cardioversions done since that time  . Wide-complex tachycardia   . Left ventricular ejection fraction less than 40%   . Gout   . AAA (abdominal aortic aneurysm)     Surgical repair  . Discolored skin   . S/P ICD (internal cardiac  defibrillator) procedure     Dr. Lovena Le 2009... by the pacing  . SOB (shortness of breath)     Large left effusion/ thoracentesis/hospitalization/November, 2011... exudated.. cytology negative.. Dr.Wert.. no proof of mesothelioma  . Pericardial effusion   . Pleural effusion     Large loculated effusion on the left side November, 2011. This was tapped. It was exudative. Cytology revealed no cancer no proof of mesothelioma area pulmonary team felt that no further workup was needed  . S/P AAA repair   . Spinal stenosis     Surgery Dr.Elsner  . CAD (coronary artery disease)     Catheterization July, 2008... name and vein grafts patent but low cardiac output  . Warfarin anticoagulation   . Cardiomyopathy     Ischemic... ICD  . Combined systolic and diastolic CHF   . Venous insufficiency     Toe discoloration chronic  . Mitral regurgitation     Mild echo  . Aortic valve sclerosis   . Nasal drainage     Chronic  . Alcohol ingestion of more than four drinks per week     Excess beer  not a dependency problem  . Chronotropic incompetence     IV pacing rate adjusted  . Thrombophlebitis of superficial veins of upper extremities     Possible venous stenosis from defibrillator  . Pericardial effusion     November, 2011 .. decreased during hospitalization  .  Eye abnormality     Ophthalmologist questions a clot in one of his eyes, May, 2012  . Overweight(278.02)     November, 2012  . Pleural thickening   . Ejection fraction     Ejection fraction has varied over time from 35-50%.,, Echoes are technically very difficult,,, EF 50%, echo, May 25, 2011, technically very difficult  . Drug therapy     Redness and swelling with Avelox infusion May 24, 2011  . COPD (chronic obstructive pulmonary disease)   . Myocardial infarction   . Spinal cord stimulator status     October, 2013  . Carotid artery disease     Doppler, December, 2013, 0-39% bilateral  . Pneumonia   . Arthritis   . Ventral  hernia     April, 2014, result of his abdominal surgery  . Bony abnormality     Patient's manubrium is slightly displaced to the right  . Ascending aortic aneurysm 05/10/2014  . 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 Internal hemorrhoids   . Diverticulosis   . Adenomatous colon polyp     Past Surgical History  Procedure Laterality Date  . Colonoscopy w/ polypectomy    . Lumbar fusion    . Pilonidal cyst removal    . Surgery scrotal / testicular    . Coronary artery bypass graft  2004  . Abdominal aortic aneurysm repair  11/2002  . Bi-ventricular pacemaker insertion (crt-p)  02-11-2013    Pt with previously implanted MDT CRTD downgraded to CRTP by Dr Lovena Le 02-11-13  . Back surgery    . Incision and drainage abscess / hematoma of bursa / knee / thigh    . Implantable cardioverter defibrillator (icd) generator change N/A 02/11/2013    Procedure: ICD GENERATOR CHANGE;  Surgeon: Evans Lance, MD;  Location: [facility] CATH LAB;  Service: Cardiovascular;  Laterality: N/A;  . Leg surgery Right    Family History  Problem Relation Age of Onset  . Hypertension Mother   . Stroke Mother   . Diabetes Father   . Coronary artery disease Father   . Other Father     DVT  . Colon cancer Neg Hx   . Diabetes Brother    History  Substance Use Topics  . Smoking status: Former Smoker -- 3.00 packs/day for 39 years    Types: Cigarettes    Start date: 05/24/1956    Quit date: 03/22/1995  . Smokeless tobacco: Never Used     Comment: quit smoking 18 years ago  . Alcohol Use: 1.2 oz/week    2 Cans of beer per week     Comment: beer   Current Outpatient Prescriptions  Medication Sig Dispense Refill  . ACCU-CHEK FASTCLIX LANCETS MISC USE ONE LANCET TO CHECK BLOOD SUGAR THREE TIMES DAILY 102 each 5  . ACCU-CHEK SMARTVIEW test strip     . acetaminophen (TYLENOL) 325 MG tablet Take 975 mg by mouth every morning.    Marland Kitchen allopurinol (ZYLOPRIM) 100 MG tablet Take 1 tablet (100 mg total) by mouth daily. 90 tablet 3    . amLODipine (NORVASC) 5 MG tablet Take 2.5 mg by mouth daily.    Marland Kitchen aspirin 81 MG tablet Take 81 mg by mouth once a week.     Marland Kitchen atorvastatin (LIPITOR) 10 MG tablet Take 1 tablet (10 mg total) by mouth daily. 90 tablet 3  . carboxymethylcellulose (REFRESH PLUS) 0.5 % SOLN Place 2 drops into both eyes daily.     . carvedilol (COREG) 6.25 MG tablet  TAKE ONE TABLET BY MOUTH TWICE DAILY 180 tablet 1  . chlorthalidone (HYGROTON) 25 MG tablet Take when weight reaches 244 lbs. or greater only 90 tablet 3  . Cholecalciferol 10000 UNITS CAPS Take 1,000 Units by mouth daily.    . furosemide (LASIX) 80 MG tablet Take 1 tablet (80 mg total) by mouth 2 (two) times daily. 90 tablet 3  . gabapentin (NEURONTIN) 300 MG capsule 3 x daily as needed for leg pain 270 capsule 3  . insulin lispro (HUMALOG KWIKPEN) 100 UNIT/ML KiwkPen Inject 0.12-0.14 mLs (12-14 Units total) into the skin 2 (two) times daily. 5 pen 3  . ipratropium (ATROVENT) 0.06 % nasal spray Place 1 spray into both nostrils as directed.     Marland Kitchen LEVEMIR FLEXTOUCH 100 UNIT/ML Pen INJECT 35 UNITS INTO THE SKIN EVERY NIGHT AT BEDTIME 15 mL 5  . losartan (COZAAR) 50 MG tablet Take 1 tablet (50 mg total) by mouth daily. 30 tablet 3  . metFORMIN (GLUCOPHAGE-XR) 750 MG 24 hr tablet TAKE ONE TABLET BY MOUTH ONCE DAILY WITH BREAKFAST 90 tablet 1  . naphazoline-pheniramine (NAPHCON-A) 0.025-0.3 % ophthalmic solution Place 1 drop into both eyes daily as needed for irritation.    . nitroGLYCERIN (NITROSTAT) 0.4 MG SL tablet Place 0.4 mg under the tongue every 5 (five) minutes as needed for chest pain (MAX 3 TABLETS).     . Potassium Chloride ER 20 MEQ TBCR Take 30 mcg by mouth 2 (two) times daily. 60 tablet   . warfarin (COUMADIN) 2.5 MG tablet Take 2.5 mg daily except Wednesday, take 5 mg. 130 tablet 0   No current facility-administered medications for this visit.   Allergies  Allergen Reactions  . Avelox [Moxifloxacin Hcl In Nacl] Swelling, Rash and Other  (See Comments)    Patient became hypotensive after infusion started Because of a history of documented adverse serious drug reaction;Medi Alert bracelet  is recommended  . Penicillins Anaphylaxis    REACTION: anaphylaxis Because of a history of documented adverse serious drug reaction;Medi Alert bracelet  is recommended     Review of Systems: Per history of present illness. Patient denies fever, chills, night sweats, headache, rash, vision changes, hearing changes, chest pain, nausea or vomiting, or urinary symptoms.     LAB RESULTS:  Recent Labs  08/08/14 1515  NA 138  K 3.8  CL 99  CO2 30  GLUCOSE 144*  BUN 33*  CREATININE 1.49  CALCIUM 9.7   PT/INR  Recent Labs  08/08/14 1412  INR 2.7     Prior Endoscopies:  See history of present illness  Physical Exam: BP 100/58 mmHg  Pulse 68  Ht 6' (1.829 m)  Wt 249 lb (112.946 kg)  BMI 33.76 kg/m2 Constitutional: Pleasant,well-developed male in no acute distress. HEENT: Normocephalic and atraumatic. Conjunctivae are normal. No scleral icterus. Neck supple. No JVD Cardiovascular: Normal rate, regular rhythm.  Pulmonary/chest: Effort normal and breath sounds normal. No wheezing, rales or rhonchi. Abdominal: Soft, nondistended, nontender. Bowel sounds active throughout. There are no masses palpable. No hepatomegaly. Extremities: no edema Lymphadenopathy: No cervical adenopathy noted. Neurological: Alert and oriented to person place and time. Skin: Skin is warm and dry. No rashes noted. Bruising noted on arms and hands Psychiatric: Normal mood and affect. Behavior is normal.  ASSESSMENT AND PLAN: 74 year old male with a personal history of adenomatous colon polyps due for surveillance colonoscopy.The risks, benefits, and alternatives to colonoscopy with possible biopsy and possible polypectomy were discussed with the patient and they  consent to proceed. Will hold warfarin  5 days prior to endoscopic procedures - will  instruct when and how to resume after procedure. Benefits and risks of procedure explained including risks of bleeding, perforation, infection, missed lesions, reactions to medications and possible need for hospitalization and surgery for complications. Additional rare but real risk of stroke or other vascular clotting events off warfarin also explained and need to seek urgent help if any signs of these problems occur. Will communicate by phone or EMR with patient's  prescribing provider to confirm that holding warfarin is reasonable in this case. Insulin will be adjusted prior to endoscopic procedure per protocol. Will resume normal dosing after procedure.    [name], [name] 08/11/2014, 11:18 AM  CC: Hendricks Limes, MD         Dola Argyle, MD

## 2014-08-11 NOTE — Patient Instructions (Signed)
You have been scheduled for a colonoscopy. Please follow written instructions given to you at your visit today.  Please pick up your prep supplies at the pharmacy within the next 1-3 days. If you use inhalers (even only as needed), please bring them with you on the day of your procedure. Your physician has requested that you go to www.startemmi.com and enter the access code given to you at your visit today. This web site gives a general overview about your procedure. However, you should still follow specific instructions given to you by our office regarding your preparation for the procedure.  You will be contaced by our office prior to your procedure for directions on holding your Coumadin/Warfarin.  If you do not hear from our office 1 week prior to your scheduled procedure, please call [phone] to discuss. 

## 2014-08-12 ENCOUNTER — Other Ambulatory Visit (HOSPITAL_BASED_OUTPATIENT_CLINIC_OR_DEPARTMENT_OTHER): Payer: Medicare Other

## 2014-08-12 ENCOUNTER — Ambulatory Visit (HOSPITAL_BASED_OUTPATIENT_CLINIC_OR_DEPARTMENT_OTHER): Payer: Medicare Other

## 2014-08-12 ENCOUNTER — Ambulatory Visit (HOSPITAL_BASED_OUTPATIENT_CLINIC_OR_DEPARTMENT_OTHER): Payer: Medicare Other | Admitting: Family

## 2014-08-12 VITALS — BP 110/73 | HR 61 | Resp 20

## 2014-08-12 VITALS — BP 127/74 | HR 55 | Temp 97.4°F | Resp 20 | Ht 72.0 in | Wt 249.0 lb

## 2014-08-12 DIAGNOSIS — D564 Hereditary persistence of fetal hemoglobin [HPFH]: Secondary | ICD-10-CM | POA: Diagnosis not present

## 2014-08-12 DIAGNOSIS — D45 Polycythemia vera: Secondary | ICD-10-CM

## 2014-08-12 LAB — COMPREHENSIVE METABOLIC PANEL
ALT: 9 U/L (ref 0–53)
AST: 11 U/L (ref 0–37)
Albumin: 3.7 g/dL (ref 3.5–5.2)
Alkaline Phosphatase: 60 U/L (ref 39–117)
BUN: 44 mg/dL — AB (ref 6–23)
CALCIUM: 8.9 mg/dL (ref 8.4–10.5)
CHLORIDE: 100 meq/L (ref 96–112)
CO2: 27 meq/L (ref 19–32)
Creatinine, Ser: 1.74 mg/dL — ABNORMAL HIGH (ref 0.50–1.35)
GLUCOSE: 143 mg/dL — AB (ref 70–99)
POTASSIUM: 3.7 meq/L (ref 3.5–5.3)
SODIUM: 138 meq/L (ref 135–145)
Total Bilirubin: 0.6 mg/dL (ref 0.2–1.2)
Total Protein: 5.9 g/dL — ABNORMAL LOW (ref 6.0–8.3)

## 2014-08-12 LAB — CBC WITH DIFFERENTIAL (CANCER CENTER ONLY)
BASO#: 0 10*3/uL (ref 0.0–0.2)
BASO%: 0.5 % (ref 0.0–2.0)
EOS%: 2.7 % (ref 0.0–7.0)
Eosinophils Absolute: 0.2 10*3/uL (ref 0.0–0.5)
HCT: 46.4 % (ref 38.7–49.9)
HEMOGLOBIN: 16 g/dL (ref 13.0–17.1)
LYMPH#: 1.1 10*3/uL (ref 0.9–3.3)
LYMPH%: 18.8 % (ref 14.0–48.0)
MCH: 31.2 pg (ref 28.0–33.4)
MCHC: 34.5 g/dL (ref 32.0–35.9)
MCV: 90 fL (ref 82–98)
MONO#: 0.6 10*3/uL (ref 0.1–0.9)
MONO%: 11 % (ref 0.0–13.0)
NEUT#: 3.8 10*3/uL (ref 1.5–6.5)
NEUT%: 67 % (ref 40.0–80.0)
Platelets: 177 10*3/uL (ref 145–400)
RBC: 5.13 10*6/uL (ref 4.20–5.70)
RDW: 14.9 % (ref 11.1–15.7)
WBC: 5.6 10*3/uL (ref 4.0–10.0)

## 2014-08-12 LAB — FERRITIN CHCC: Ferritin: 71 ng/ml (ref 22–316)

## 2014-08-12 LAB — IRON AND TIBC CHCC
%SAT: 30 % (ref 20–55)
Iron: 106 ug/dL (ref 42–163)
TIBC: 349 ug/dL (ref 202–409)
UIBC: 243 ug/dL (ref 117–376)

## 2014-08-12 NOTE — Progress Notes (Signed)
[name] presents today for phlebotomy per MD orders. Phlebotomy procedure started at 0900and ended at 0925. Approximately 500 mls removed. Patient observed for 30 minutes after procedure without any incident. Patient tolerated procedure well. IV needle removed intact.

## 2014-08-12 NOTE — Patient Instructions (Signed)

## 2014-08-12 NOTE — Progress Notes (Signed)
Hematology and Oncology Follow Up Visit  [name] [id number] 1940-03-25 74 y.o. 08/12/2014   Principle Diagnosis:  Polycythemia vera  Current Therapy:   Phlebotomy to maintain hematocrit below 45%. Aspirin 81 mg by mouth daily    Interim History:  [name] is here today with his wife for a follow-up. He is feeling much better. His bronchitis has resolved. He does state that he feels like he needs phlebotomized today. His last phlebotomy was on May 9th. His Hct today is 46.4 and he is having some SOB with exertion.  He denies fever, rash, dizziness, headaches, blurred vision, chest pain, palpitations, abdominal pain, constipation, diarrhea, blood in urine or stool.  His gout in the right foot is getting better. He is taking allopurinol daily. He has no other tenderness, numbness or tingling in his extremities. No new aches or pains.  He is eating well and drinking lots of fluids. His weight is stable.   Medications:    Medication List       This list is accurate as of: 08/12/14  8:57 AM.  Always use your most recent med list.               ACCU-CHEK FASTCLIX LANCETS Misc  USE ONE LANCET TO CHECK BLOOD SUGAR THREE TIMES DAILY     ACCU-CHEK SMARTVIEW test strip  Generic drug:  glucose blood     acetaminophen 325 MG tablet  Commonly known as:  TYLENOL  Take 975 mg by mouth every morning.     allopurinol 100 MG tablet  Commonly known as:  ZYLOPRIM  Take 1 tablet (100 mg total) by mouth daily.     amLODipine 5 MG tablet  Commonly known as:  NORVASC  Take 2.5 mg by mouth daily.     aspirin 81 MG tablet  Take 81 mg by mouth once a week.     atorvastatin 10 MG tablet  Commonly known as:  LIPITOR  Take 1 tablet (10 mg total) by mouth daily.     carboxymethylcellulose 0.5 % Soln  Commonly known as:  REFRESH PLUS  Place 2 drops into both eyes daily.     carvedilol 6.25 MG tablet  Commonly known as:  COREG  TAKE ONE TABLET BY MOUTH TWICE DAILY     chlorthalidone 25 MG tablet  Commonly known as:  HYGROTON  Take when weight reaches 244 lbs. or greater only     Cholecalciferol 10000 UNITS Caps  Take 1,000 Units by mouth daily.     furosemide 80 MG tablet  Commonly known as:  LASIX  Take 1 tablet (80 mg total) by mouth 2 (two) times daily.     gabapentin 300 MG capsule  Commonly known as:  NEURONTIN  3 x daily as needed for leg pain     insulin lispro 100 UNIT/ML KiwkPen  Commonly known as:  HUMALOG KWIKPEN  Inject 0.12-0.14 mLs (12-14 Units total) into the skin 2 (two) times daily.     ipratropium 0.06 % nasal spray  Commonly known as:  ATROVENT  Place 1 spray into both nostrils as directed.     LEVEMIR FLEXTOUCH 100 UNIT/ML Pen  Generic drug:  Insulin Detemir  INJECT 35 UNITS INTO THE SKIN EVERY NIGHT AT BEDTIME     losartan 50 MG tablet  Commonly known as:  COZAAR  Take 1 tablet (50 mg total) by mouth daily.     metFORMIN 750 MG 24 hr tablet  Commonly known as:  GLUCOPHAGE-XR  TAKE ONE TABLET BY MOUTH ONCE DAILY WITH BREAKFAST     naphazoline-pheniramine 0.025-0.3 % ophthalmic solution  Commonly known as:  NAPHCON-A  Place 1 drop into both eyes daily as needed for irritation.     nitroGLYCERIN 0.4 MG SL tablet  Commonly known as:  NITROSTAT  Place 0.4 mg under the tongue every 5 (five) minutes as needed for chest pain (MAX 3 TABLETS).     Potassium Chloride ER 20 MEQ Tbcr  Take 30 mcg by mouth 2 (two) times daily.     warfarin 2.5 MG tablet  Commonly known as:  COUMADIN  Take 2.5 mg daily except Wednesday, take 5 mg.        Allergies:  Allergies  Allergen Reactions  . Avelox [Moxifloxacin Hcl In Nacl] Swelling, Rash and Other (See Comments)    Patient became hypotensive after infusion started Because of a history of documented adverse serious drug reaction;Medi Alert bracelet  is recommended  . Penicillins Anaphylaxis    REACTION: anaphylaxis Because of a history of documented adverse serious drug  reaction;Medi Alert bracelet  is recommended    Past Medical History, Surgical history, Social history, and Family History were reviewed and updated.  Review of Systems: All other 10 point review of systems is negative.   Physical Exam:  height is 6' (1.829 m) and weight is 249 lb (112.946 kg). His temperature is 97.4 F (36.3 C). His blood pressure is 127/74 and his pulse is 55. His respiration is 20.   Wt Readings from Last 3 Encounters:  08/12/14 249 lb (112.946 kg)  08/11/14 249 lb (112.946 kg)  08/08/14 251 lb (113.853 kg)    Ocular: Sclerae unicteric, pupils equal, round and reactive to light Ear-nose-throat: Oropharynx clear, dentition fair Lymphatic: No cervical or supraclavicular adenopathy Lungs no rales or rhonchi, good excursion bilaterally Heart regular rate and rhythm, no murmur appreciated Abd soft, nontender, positive bowel sounds MSK no focal spinal tenderness, no joint edema Neuro: non-focal, well-oriented, appropriate affect  Lab Results  Component Value Date   WBC 5.6 08/12/2014   HGB 16.0 08/12/2014   HCT 46.4 08/12/2014   MCV 90 08/12/2014   PLT 177 08/12/2014   Lab Results  Component Value Date   FERRITIN 62 07/28/2014   IRON 101 07/28/2014   TIBC 375 07/28/2014   UIBC 274 07/28/2014   IRONPCTSAT 27 07/28/2014   Lab Results  Component Value Date   RETICCTPCT 1.4 04/02/2014   RBC 5.13 08/12/2014   RETICCTABS 75.2 04/02/2014   No results found for: KPAFRELGTCHN, LAMBDASER, KAPLAMBRATIO No results found for: Kandis Cocking, IGMSERUM No results found for: Odetta Pink, SPEI   Chemistry      Component Value Date/Time   NA 138 08/08/2014 1515   NA 144 04/22/2014 1350   K 3.8 08/08/2014 1515   K 3.9 04/22/2014 1350   CL 99 08/08/2014 1515   CL 100 04/22/2014 1350   CO2 30 08/08/2014 1515   CO2 29 04/22/2014 1350   BUN 33* 08/08/2014 1515   BUN 30* 04/22/2014 1350   CREATININE 1.49  08/08/2014 1515   CREATININE 1.6* 04/22/2014 1350      Component Value Date/Time   CALCIUM 9.7 08/08/2014 1515   CALCIUM 9.1 04/22/2014 1350   ALKPHOS 58 07/28/2014 0817   ALKPHOS 55 04/22/2014 1350   AST 14 07/28/2014 0817   AST 16 04/22/2014 1350   ALT 11 07/28/2014 0817   ALT 15 04/22/2014 1350  BILITOT 0.6 07/28/2014 0817   BILITOT 1.00 04/22/2014 1350     Impression and Plan: Mr. Rogerson is 74 year old male with polycythemia vera. He is having some SOB and feels that he need phlebotomized today. His Hct is 46.4 and his complexion is ruddy.  We will phlebotomize him with the goal of Hct less than 45.  We will plan to see him back in 3 weeks for labs and follow-up after he and his wife return from the beach.  He knows to call with any questions or concerns. We can certainly see him sooner if need be.   Eliezer Bottom, NP 5/24/20168:57 AM

## 2014-08-13 ENCOUNTER — Ambulatory Visit: Payer: Medicare Other | Admitting: Family

## 2014-08-13 ENCOUNTER — Other Ambulatory Visit: Payer: Medicare Other

## 2014-08-22 ENCOUNTER — Telehealth: Payer: Self-pay | Admitting: Cardiology

## 2014-08-22 ENCOUNTER — Other Ambulatory Visit: Payer: Self-pay | Admitting: Cardiology

## 2014-08-22 ENCOUNTER — Other Ambulatory Visit: Payer: Self-pay | Admitting: Internal Medicine

## 2014-08-22 NOTE — Telephone Encounter (Signed)
Follow Up       Pt's wife calling requesting a call back today before Lynn leaves. Please call back and advise.

## 2014-08-22 NOTE — Telephone Encounter (Signed)
New Message  Pt returning Lynn's phone call- had message to call Dr. [name] office, just returned from vacation;

## 2014-08-25 ENCOUNTER — Other Ambulatory Visit: Payer: Self-pay

## 2014-08-25 ENCOUNTER — Telehealth: Payer: Self-pay | Admitting: Cardiology

## 2014-08-25 MED ORDER — POTASSIUM CHLORIDE CRYS ER 20 MEQ PO TBCR: EXTENDED_RELEASE_TABLET | ORAL | Status: DC

## 2014-08-25 NOTE — Telephone Encounter (Signed)
Follow Up      Pt's wife calling back to speak to Jeani Hawking, states she called on Friday, will not elaborate. Please call back and advise.

## 2014-08-25 NOTE — Telephone Encounter (Signed)
**Note De-Identified Smita Lesh Obfuscation** The pts wife states that the pt is out of Klor con and is requesting a refill. Klor Con RX sent to [facility] on [address] to fill. The pt is aware.

## 2014-08-25 NOTE — Telephone Encounter (Signed)
**Note De-Identified Camber Ninh Obfuscation** The pts wife states that the pt is out of Klor con and is requesting a refill. Klor Con RX sent to [facility] on [address] to fill. The pt is aware.

## 2014-08-26 ENCOUNTER — Ambulatory Visit (INDEPENDENT_AMBULATORY_CARE_PROVIDER_SITE_OTHER): Payer: Medicare Other | Admitting: Internal Medicine

## 2014-08-26 ENCOUNTER — Encounter: Payer: Self-pay | Admitting: Internal Medicine

## 2014-08-26 VITALS — BP 130/76 | HR 76 | Ht 72.0 in | Wt 249.6 lb

## 2014-08-26 DIAGNOSIS — I10 Essential (primary) hypertension: Secondary | ICD-10-CM

## 2014-08-26 DIAGNOSIS — I482 Chronic atrial fibrillation, unspecified: Secondary | ICD-10-CM

## 2014-08-26 DIAGNOSIS — I5022 Chronic systolic (congestive) heart failure: Secondary | ICD-10-CM

## 2014-08-26 DIAGNOSIS — Z95810 Presence of automatic (implantable) cardiac defibrillator: Secondary | ICD-10-CM

## 2014-08-26 DIAGNOSIS — I255 Ischemic cardiomyopathy: Secondary | ICD-10-CM | POA: Diagnosis not present

## 2014-08-26 LAB — CUP PACEART INCLINIC DEVICE CHECK
Battery Voltage: 2.98 V
Brady Statistic AP VP Percent: 0 %
Brady Statistic AP VS Percent: 0 %
Brady Statistic AS VP Percent: 12.12 %
Brady Statistic RA Percent Paced: 0 %
Brady Statistic RV Percent Paced: 16.37 %
HIGH POWER IMPEDANCE MEASURED VALUE: 190 Ohm
HighPow Impedance: 65 Ohm
Lead Channel Impedance Value: 4047 Ohm
Lead Channel Impedance Value: 4047 Ohm
Lead Channel Impedance Value: 456 Ohm
Lead Channel Sensing Intrinsic Amplitude: 11.625 mV
Lead Channel Setting Pacing Amplitude: 2.5 V
MDC IDC MSMT BATTERY REMAINING LONGEVITY: 116 mo
MDC IDC MSMT LEADCHNL LV IMPEDANCE VALUE: 532 Ohm
MDC IDC MSMT LEADCHNL RA IMPEDANCE VALUE: 456 Ohm
MDC IDC MSMT LEADCHNL RA SENSING INTR AMPL: 0.75 mV
MDC IDC MSMT LEADCHNL RV PACING THRESHOLD AMPLITUDE: 0.75 V
MDC IDC MSMT LEADCHNL RV PACING THRESHOLD PULSEWIDTH: 0.4 ms
MDC IDC SESS DTM: 20160607131351
MDC IDC SET LEADCHNL RV PACING PULSEWIDTH: 0.4 ms
MDC IDC SET LEADCHNL RV SENSING SENSITIVITY: 0.3 mV
MDC IDC SET ZONE DETECTION INTERVAL: 300 ms
MDC IDC SET ZONE DETECTION INTERVAL: 340 ms
MDC IDC SET ZONE DETECTION INTERVAL: 350 ms
MDC IDC STAT BRADY AS VS PERCENT: 87.88 %
Zone Setting Detection Interval: 450 ms

## 2014-08-26 NOTE — Assessment & Plan Note (Signed)
His ventricular rate is well controlled. He will continue his current meds. 

## 2014-08-26 NOTE — Assessment & Plan Note (Signed)
His device is working normally. Will recheck in several months. 

## 2014-08-26 NOTE — Assessment & Plan Note (Signed)
His symptoms are now class 2. He will continue his current meds. He will maintain a low sodium diet.

## 2014-08-26 NOTE — Patient Instructions (Signed)
Medication Instructions:  Your physician recommends that you continue on your current medications as directed. Please refer to the Current Medication list given to you today.   Labwork: NONE  Testing/Procedures: NONE  Follow-Up: Your physician wants you to follow-up in: 6 months with Dr. Lovena Le. You will receive a reminder letter in the mail two months in advance. If you don't receive a letter, please call our office to schedule the follow-up appointment.  Remote monitoring is used to monitor your Pacemaker or ICD from home. This monitoring reduces the number of office visits required to check your device to one time per year. It allows [place] to keep an eye on the functioning of your device to ensure it is working properly. You are scheduled for a device check from home on 11/25/2014. You may send your transmission at any time that day. If you have a wireless device, the transmission will be sent automatically. After your physician reviews your transmission, you will receive a postcard with your next transmission date.    Any Other Special Instructions Will Be Listed Below (If Applicable).

## 2014-08-26 NOTE — Assessment & Plan Note (Signed)
His blood pressure is well controlled. I have asked her to reduce his salt intake which still needs some work.

## 2014-08-26 NOTE — Progress Notes (Signed)
HPI [name] returns today for ongoing followup of his ICD and chronic systolic heart failure. He has polycythemia and his heart failure has improved with the initiation of phlebotomy. He has a h/o chronic atrial fibrillation. He has tried to reduce his salt intake but still admits to eating more salt then he should. No ICD shocks. Allergies  Allergen Reactions  . Avelox [Moxifloxacin Hcl In Nacl] Swelling, Rash and Other (See Comments)    Patient became hypotensive after infusion started Because of a history of documented adverse serious drug reaction;Medi Alert bracelet  is recommended  . Penicillins Anaphylaxis    REACTION: anaphylaxis Because of a history of documented adverse serious drug reaction;Medi Alert bracelet  is recommended     Current Outpatient Prescriptions  Medication Sig Dispense Refill  . ACCU-CHEK FASTCLIX LANCETS MISC USE ONE LANCET TO CHECK BLOOD SUGAR THREE TIMES DAILY 102 each 5  . ACCU-CHEK SMARTVIEW test strip     . acetaminophen (TYLENOL) 325 MG tablet Take 975 mg by mouth every morning.    Marland Kitchen allopurinol (ZYLOPRIM) 100 MG tablet Take 1 tablet (100 mg total) by mouth daily. 90 tablet 3  . amLODipine (NORVASC) 5 MG tablet Take 2.5 mg by mouth daily.    Marland Kitchen aspirin 81 MG tablet Take 81 mg by mouth once a week.     Marland Kitchen atorvastatin (LIPITOR) 10 MG tablet TAKE ONE TABLET BY MOUTH ONCE DAILY 90 tablet 0  . carboxymethylcellulose (REFRESH PLUS) 0.5 % SOLN Place 2 drops into both eyes daily.     . carvedilol (COREG) 6.25 MG tablet TAKE ONE TABLET BY MOUTH TWICE DAILY 180 tablet 1  . chlorthalidone (HYGROTON) 25 MG tablet Take when weight reaches 244 lbs. or greater only 90 tablet 3  . Cholecalciferol 10000 UNITS CAPS Take 1,000 Units by mouth daily.    . furosemide (LASIX) 80 MG tablet Take 1 tablet (80 mg total) by mouth 2 (two) times daily. 90 tablet 3  . gabapentin (NEURONTIN) 300 MG capsule 3 x daily as needed for leg pain 270 capsule 3  . insulin lispro  (HUMALOG KWIKPEN) 100 UNIT/ML KiwkPen Inject 0.12-0.14 mLs (12-14 Units total) into the skin 2 (two) times daily. 5 pen 3  . ipratropium (ATROVENT) 0.06 % nasal spray Place 1 spray into both nostrils as directed.     Marland Kitchen LEVEMIR FLEXTOUCH 100 UNIT/ML Pen INJECT 35 UNITS INTO THE SKIN EVERY NIGHT AT BEDTIME 15 mL 5  . losartan (COZAAR) 50 MG tablet Take 1 tablet (50 mg total) by mouth daily. 90 tablet 3  . metFORMIN (GLUCOPHAGE-XR) 750 MG 24 hr tablet TAKE ONE TABLET BY MOUTH ONCE DAILY WITH BREAKFAST 90 tablet 1  . naphazoline-pheniramine (NAPHCON-A) 0.025-0.3 % ophthalmic solution Place 1 drop into both eyes daily as needed for irritation.    . nitroGLYCERIN (NITROSTAT) 0.4 MG SL tablet Place 0.4 mg under the tongue every 5 (five) minutes as needed for chest pain (MAX 3 TABLETS).     . Potassium Chloride ER 20 MEQ TBCR Take 30 mcg by mouth 2 (two) times daily. 60 tablet   . warfarin (COUMADIN) 2.5 MG tablet Take 2.5 mg daily except Wednesday, take 5 mg. 130 tablet 0   No current facility-administered medications for this visit.     Past Medical History  Diagnosis Date  . Diabetes mellitus   . Hyperlipidemia   . Hypertension   . Pilonidal cyst   . Atrial fibrillation     Previous long-term  amiodarone therapy with multiple cardioversions / amiodarone stopped September, 2009  . Atrial flutter     Started November, 2010, Left-sided and cannot ablate  . Left atrial thrombus     Remote past... cardioversions done since that time  . Wide-complex tachycardia   . Left ventricular ejection fraction less than 40%   . Gout   . AAA (abdominal aortic aneurysm)     Surgical repair  . Discolored skin   . S/P ICD (internal cardiac defibrillator) procedure     Dr. Lovena Le 2009... by the pacing  . SOB (shortness of breath)     Large left effusion/ thoracentesis/hospitalization/November, 2011... exudated.. cytology negative.. Dr.Wert.. no proof of mesothelioma  . Pericardial effusion   . Pleural  effusion     Large loculated effusion on the left side November, 2011. This was tapped. It was exudative. Cytology revealed no cancer no proof of mesothelioma area pulmonary team felt that no further workup was needed  . S/P AAA repair   . Spinal stenosis     Surgery Dr.Elsner  . CAD (coronary artery disease)     Catheterization July, 2008... name and vein grafts patent but low cardiac output  . Warfarin anticoagulation   . Cardiomyopathy     Ischemic... ICD  . Combined systolic and diastolic CHF   . Venous insufficiency     Toe discoloration chronic  . Mitral regurgitation     Mild echo  . Aortic valve sclerosis   . Nasal drainage     Chronic  . Alcohol ingestion of more than four drinks per week     Excess beer  not a dependency problem  . Chronotropic incompetence     IV pacing rate adjusted  . Thrombophlebitis of superficial veins of upper extremities     Possible venous stenosis from defibrillator  . Pericardial effusion     November, 2011 .. decreased during hospitalization  . Eye abnormality     Ophthalmologist questions a clot in one of his eyes, May, 2012  . Overweight(278.02)     November, 2012  . Pleural thickening   . Ejection fraction     Ejection fraction has varied over time from 35-50%.,, Echoes are technically very difficult,,, EF 50%, echo, May 25, 2011, technically very difficult  . Drug therapy     Redness and swelling with Avelox infusion May 24, 2011  . COPD (chronic obstructive pulmonary disease)   . Myocardial infarction   . Spinal cord stimulator status     October, 2013  . Carotid artery disease     Doppler, December, 2013, 0-39% bilateral  . Pneumonia   . Arthritis   . Ventral hernia     April, 2014, result of his abdominal surgery  . Bony abnormality     Patient's manubrium is slightly displaced to the right  . Ascending aortic aneurysm 05/10/2014  . 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 Internal hemorrhoids   . Diverticulosis   . Adenomatous  colon polyp     ROS:   All systems reviewed and negative except as noted in the HPI.   Past Surgical History  Procedure Laterality Date  . Colonoscopy w/ polypectomy    . Lumbar fusion    . Pilonidal cyst removal    . Surgery scrotal / testicular    . Coronary artery bypass graft  2004  . Abdominal aortic aneurysm repair  11/2002  . Bi-ventricular pacemaker insertion (crt-p)  02-11-2013    Pt with previously implanted MDT CRTD downgraded  to CRTP by Dr Lovena Le 02-11-13  . Back surgery    . Incision and drainage abscess / hematoma of bursa / knee / thigh    . Implantable cardioverter defibrillator (icd) generator change N/A 02/11/2013    Procedure: ICD GENERATOR CHANGE;  Surgeon: Evans Lance, MD;  Location[facility] C[facility] LAB;  Service: Cardiovascular;  Laterality: N/A;  . Leg surgery Right      Family History  Problem Relation Age of Onset  . Hypertension Mother   . Stroke Mother   . Diabetes Father   . Coronary artery disease Father   . Other Father     DVT  . Colon cancer Neg Hx   . Diabetes Brother      History   Social History  . Marital Status: Married    Spouse Name: N/A  . Number of Children: N/A  . Years of Education: N/A   Occupational History  . Retired- Nurse, mental health    Social History Main Topics  . Smoking status: Former Smoker -- 3.00 packs/day for 39 years    Types: Cigarettes    Start date: 05/24/1956    Quit date: 03/22/1995  . Smokeless tobacco: Never Used     Comment: quit smoking 18 years ago  . Alcohol Use: 1.2 oz/week    2 Cans of beer per week     Comment: beer  . Drug Use: No  . Sexual Activity: No   Other Topics Concern  . Not on file   Social History Narrative     BP 130/76 mmHg  Pulse 76  Ht 6' (1.829 m)  Wt 249 lb 9.6 oz (113.218 kg)  BMI 33.84 kg/m2  SpO2 97%  Physical Exam:  Well appearing NAD HEENT: Unremarkable Neck:  No JVD, no thyromegally Lymphatics:  No adenopathy Back:  No CVA tenderness Lungs:   Clear HEART:  Regular rate rhythm, no murmurs, no rubs, no clicks Abd:  soft, positive bowel sounds, no organomegally, no rebound, no guarding Ext:  2 plus pulses, no edema, no cyanosis, no clubbing Skin:  No rashes no nodules Neuro:  CN II through XII intact, motor grossly intact  EKG  DEVICE  Normal device function.  See PaceArt for details.   Assess/Plan:

## 2014-09-01 ENCOUNTER — Other Ambulatory Visit: Payer: Self-pay | Admitting: Internal Medicine

## 2014-09-01 NOTE — Telephone Encounter (Signed)
Allipurinol rx sent to pharm

## 2014-09-02 ENCOUNTER — Other Ambulatory Visit (INDEPENDENT_AMBULATORY_CARE_PROVIDER_SITE_OTHER): Payer: Medicare Other

## 2014-09-02 DIAGNOSIS — E1149 Type 2 diabetes mellitus with other diabetic neurological complication: Secondary | ICD-10-CM | POA: Diagnosis not present

## 2014-09-02 LAB — COMPREHENSIVE METABOLIC PANEL
ALT: 9 U/L (ref 0–53)
AST: 12 U/L (ref 0–37)
Albumin: 3.8 g/dL (ref 3.5–5.2)
Alkaline Phosphatase: 60 U/L (ref 39–117)
BILIRUBIN TOTAL: 0.8 mg/dL (ref 0.2–1.2)
BUN: 36 mg/dL — ABNORMAL HIGH (ref 6–23)
CHLORIDE: 102 meq/L (ref 96–112)
CO2: 27 mEq/L (ref 19–32)
CREATININE: 1.62 mg/dL — AB (ref 0.40–1.50)
Calcium: 9.2 mg/dL (ref 8.4–10.5)
GFR: 44.57 mL/min — ABNORMAL LOW (ref >60.00)
Glucose, Bld: 131 mg/dL — ABNORMAL HIGH (ref 70–99)
Potassium: 4.1 mEq/L (ref 3.5–5.1)
Sodium: 138 mEq/L (ref 135–145)
TOTAL PROTEIN: 6.5 g/dL (ref 6.0–8.3)

## 2014-09-02 LAB — LIPID PANEL
Cholesterol: 152 mg/dL (ref 0–200)
HDL: 40 mg/dL (ref >39.00)
LDL CALC: 90 mg/dL (ref 0–99)
NonHDL: 112
TRIGLYCERIDES: 112 mg/dL (ref 0.0–149.0)
Total CHOL/HDL Ratio: 4
VLDL: 22.4 mg/dL (ref 0.0–40.0)

## 2014-09-02 LAB — URINALYSIS, ROUTINE W REFLEX MICROSCOPIC
Bilirubin Urine: NEGATIVE
HGB URINE DIPSTICK: NEGATIVE
KETONES UR: NEGATIVE
Leukocytes, UA: NEGATIVE
NITRITE: NEGATIVE
PH: 6 (ref 5.0–8.0)
SPECIFIC GRAVITY, URINE: 1.01 (ref 1.000–1.030)
Total Protein, Urine: NEGATIVE
UROBILINOGEN UA: 0.2 (ref 0.0–1.0)
Urine Glucose: NEGATIVE

## 2014-09-02 LAB — MICROALBUMIN / CREATININE URINE RATIO
Creatinine,U: 97.7 mg/dL
Microalb Creat Ratio: 1.6 mg/g (ref 0.0–30.0)
Microalb, Ur: 1.6 mg/dL (ref 0.0–1.9)

## 2014-09-02 LAB — HEMOGLOBIN A1C: Hgb A1c MFr Bld: 6.6 % — ABNORMAL HIGH (ref 4.6–6.5)

## 2014-09-03 ENCOUNTER — Encounter: Payer: Self-pay | Admitting: Hematology & Oncology

## 2014-09-03 ENCOUNTER — Ambulatory Visit (HOSPITAL_BASED_OUTPATIENT_CLINIC_OR_DEPARTMENT_OTHER): Payer: Medicare Other | Admitting: Hematology & Oncology

## 2014-09-03 ENCOUNTER — Other Ambulatory Visit (HOSPITAL_BASED_OUTPATIENT_CLINIC_OR_DEPARTMENT_OTHER): Payer: Medicare Other

## 2014-09-03 VITALS — BP 133/82 | HR 57 | Temp 97.6°F | Resp 20 | Ht 72.0 in | Wt 253.0 lb

## 2014-09-03 DIAGNOSIS — D45 Polycythemia vera: Secondary | ICD-10-CM

## 2014-09-03 DIAGNOSIS — IMO0002 Reserved for concepts with insufficient information to code with codable children: Secondary | ICD-10-CM

## 2014-09-03 DIAGNOSIS — E1165 Type 2 diabetes mellitus with hyperglycemia: Secondary | ICD-10-CM

## 2014-09-03 DIAGNOSIS — E1151 Type 2 diabetes mellitus with diabetic peripheral angiopathy without gangrene: Secondary | ICD-10-CM

## 2014-09-03 LAB — CBC WITH DIFFERENTIAL (CANCER CENTER ONLY)
BASO#: 0 10*3/uL (ref 0.0–0.2)
BASO%: 0.5 % (ref 0.0–2.0)
EOS%: 1.5 % (ref 0.0–7.0)
Eosinophils Absolute: 0.1 10*3/uL (ref 0.0–0.5)
HCT: 44.3 % (ref 38.7–49.9)
HGB: 15.1 g/dL (ref 13.0–17.1)
LYMPH#: 1.3 10*3/uL (ref 0.9–3.3)
LYMPH%: 21.8 % (ref 14.0–48.0)
MCH: 31.2 pg (ref 28.0–33.4)
MCHC: 34.1 g/dL (ref 32.0–35.9)
MCV: 92 fL (ref 82–98)
MONO#: 0.7 10*3/uL (ref 0.1–0.9)
MONO%: 11.9 % (ref 0.0–13.0)
NEUT#: 3.8 10*3/uL (ref 1.5–6.5)
NEUT%: 64.3 % (ref 40.0–80.0)
PLATELETS: 163 10*3/uL (ref 145–400)
RBC: 4.84 10*6/uL (ref 4.20–5.70)
RDW: 15.3 % (ref 11.1–15.7)
WBC: 6 10*3/uL (ref 4.0–10.0)

## 2014-09-03 LAB — COMPREHENSIVE METABOLIC PANEL
ALBUMIN: 3.7 g/dL (ref 3.5–5.2)
ALK PHOS: 62 U/L (ref 39–117)
ALT: 8 U/L (ref 0–53)
AST: 11 U/L (ref 0–37)
BUN: 34 mg/dL — AB (ref 6–23)
CO2: 25 mEq/L (ref 19–32)
CREATININE: 2.07 mg/dL — AB (ref 0.50–1.35)
Calcium: 8.9 mg/dL (ref 8.4–10.5)
Chloride: 104 mEq/L (ref 96–112)
Glucose, Bld: 102 mg/dL — ABNORMAL HIGH (ref 70–99)
Potassium: 4.2 mEq/L (ref 3.5–5.3)
Sodium: 140 mEq/L (ref 135–145)
Total Bilirubin: 0.6 mg/dL (ref 0.2–1.2)
Total Protein: 6.1 g/dL (ref 6.0–8.3)

## 2014-09-03 NOTE — Progress Notes (Signed)
Hematology and Oncology Follow Up Visit  [name] [id number] Jun 10, 1940 74 y.o. 09/03/2014   Principle Diagnosis:   Polycythemia vera  Current Therapy:    Phlebotomy to maintain hematocrit below 45%.       Aspirin 81 mg by mouth weekly       Coumadin for chronic atrial fibrillation      Interim History:  [name] is back for follow-up.he is doing pretty well. He is wife go to the beach quite a lot. They will be going back a couple weeks.  He is doing okay. He is on chronic Coumadin for atrial fibrillation.  He's had no problem with headache. He's had no cough or shortness of breath. He's had no abdominal pain.  He's had no leg swelling.  His blood sugars are doing okay.  He's had no obvious bleeding.  Overall, his performance status is ECOG 1-2.   Medications:  Current outpatient prescriptions:  .  ACCU-CHEK FASTCLIX LANCETS MISC, USE ONE LANCET TO CHECK BLOOD SUGAR THREE TIMES DAILY, Disp: 102 each, Rfl: 5 .  ACCU-CHEK SMARTVIEW test strip, , Disp: , Rfl:  .  acetaminophen (TYLENOL) 325 MG tablet, Take 975 mg by mouth every morning., Disp: , Rfl:  .  allopurinol (ZYLOPRIM) 100 MG tablet, TAKE ONE TABLET BY MOUTH ONCE DAILY, Disp: 90 tablet, Rfl: 1 .  amLODipine (NORVASC) 5 MG tablet, Take 2.5 mg by mouth daily., Disp: , Rfl:  .  aspirin 81 MG tablet, Take 81 mg by mouth once a week. , Disp: , Rfl:  .  atorvastatin (LIPITOR) 10 MG tablet, TAKE ONE TABLET BY MOUTH ONCE DAILY, Disp: 90 tablet, Rfl: 0 .  carboxymethylcellulose (REFRESH PLUS) 0.5 % SOLN, Place 2 drops into both eyes daily. , Disp: , Rfl:  .  carvedilol (COREG) 6.25 MG tablet, TAKE ONE TABLET BY MOUTH TWICE DAILY, Disp: 180 tablet, Rfl: 1 .  chlorthalidone (HYGROTON) 25 MG tablet, Take when weight reaches 244 lbs. or greater only, Disp: 90 tablet, Rfl: 3 .  Cholecalciferol 10000 UNITS CAPS, Take 1,000 Units by mouth daily., Disp: , Rfl:  .  furosemide (LASIX) 80 MG tablet, Take 1 tablet (80 mg  total) by mouth 2 (two) times daily., Disp: 90 tablet, Rfl: 3 .  gabapentin (NEURONTIN) 300 MG capsule, 3 x daily as needed for leg pain, Disp: 270 capsule, Rfl: 3 .  insulin lispro (HUMALOG KWIKPEN) 100 UNIT/ML KiwkPen, Inject 0.12-0.14 mLs (12-14 Units total) into the skin 2 (two) times daily. (Patient taking differently: Inject 8-12 Units into the skin 2 (two) times daily. ), Disp: 5 pen, Rfl: 3 .  ipratropium (ATROVENT) 0.06 % nasal spray, Place 1 spray into both nostrils as directed. , Disp: , Rfl:  .  LEVEMIR FLEXTOUCH 100 UNIT/ML Pen, INJECT 35 UNITS INTO THE SKIN EVERY NIGHT AT BEDTIME (Patient taking differently: INJECT 22 UNITS INTO THE SKIN EVERY NIGHT AT BEDTIME), Disp: 15 mL, Rfl: 5 .  losartan (COZAAR) 50 MG tablet, Take 1 tablet (50 mg total) by mouth daily., Disp: 90 tablet, Rfl: 3 .  metFORMIN (GLUCOPHAGE-XR) 750 MG 24 hr tablet, TAKE ONE TABLET BY MOUTH ONCE DAILY WITH BREAKFAST, Disp: 90 tablet, Rfl: 1 .  naphazoline-pheniramine (NAPHCON-A) 0.025-0.3 % ophthalmic solution, Place 1 drop into both eyes daily as needed for irritation., Disp: , Rfl:  .  nitroGLYCERIN (NITROSTAT) 0.4 MG SL tablet, Place 0.4 mg under the tongue every 5 (five) minutes as needed for chest pain (MAX 3 TABLETS). , Disp: ,  Rfl:  .  Potassium Chloride ER 20 MEQ TBCR, Take 30 mcg by mouth 2 (two) times daily., Disp: 60 tablet, Rfl:  .  warfarin (COUMADIN) 2.5 MG tablet, Take 2.5 mg daily except Wednesday, take 5 mg., Disp: 130 tablet, Rfl: 0  Allergies:  Allergies  Allergen Reactions  . Avelox [Moxifloxacin Hcl In Nacl] Swelling, Rash and Other (See Comments)    Patient became hypotensive after infusion started Because of a history of documented adverse serious drug reaction;Medi Alert bracelet  is recommended  . Penicillins Anaphylaxis    REACTION: anaphylaxis Because of a history of documented adverse serious drug reaction;Medi Alert bracelet  is recommended    Past Medical History, Surgical history,  Social history, and Family History were reviewed and updated.  Review of Systems: As above  Physical Exam:  height is 6' (1.829 m) and weight is 253 lb (114.76 kg). His oral temperature is 97.6 F (36.4 C). His blood pressure is 133/82 and his pulse is 57. His respiration is 20.   Well-developed and well-nourished white [name]. He is moderately obese. Head and neck exam shows no ocular or oral lesions. There is less conjunctival inflammation. There is no adenopathy in his neck. Lungs are clear. He has no rales, wheezes or rhonchi. Cardiac exam irregular rate and rhythm consistent with atrial fibrillation, he has  no murmurs, rubs or bruits. Abdomen is soft. He has good bowel sounds. He is somewhat obese. There is no palpable liver or spleen tip. Back exam no tenderness over the spine, ribs or hips. Extremities shows some chronic mild nonpitting edema of the legs. Skin exam no rashes. He still has a little bit of a ruddy complexion. Neurological exam is nonfocal.  Lab Results  Component Value Date   WBC 6.0 09/03/2014   HGB 15.1 09/03/2014   HCT 44.3 09/03/2014   MCV 92 09/03/2014   PLT 163 09/03/2014     Chemistry      Component Value Date/Time   NA 138 09/02/2014 1034   NA 144 04/22/2014 1350   K 4.1 09/02/2014 1034   K 3.9 04/22/2014 1350   CL 102 09/02/2014 1034   CL 100 04/22/2014 1350   CO2 27 09/02/2014 1034   CO2 29 04/22/2014 1350   BUN 36* 09/02/2014 1034   BUN 30* 04/22/2014 1350   CREATININE 1.62* 09/02/2014 1034   CREATININE 1.6* 04/22/2014 1350      Component Value Date/Time   CALCIUM 9.2 09/02/2014 1034   CALCIUM 9.1 04/22/2014 1350   ALKPHOS 60 09/02/2014 1034   ALKPHOS 55 04/22/2014 1350   AST 12 09/02/2014 1034   AST 16 04/22/2014 1350   ALT 9 09/02/2014 1034   ALT 15 04/22/2014 1350   BILITOT 0.8 09/02/2014 1034   BILITOT 1.00 04/22/2014 1350         Impression and Plan: [name] is 74 year old woman with polycythemia. Actually believe that he  does have polycythemia. Again, his studies are equivocal but clinically, I would say polycythemia is probable.  I still don't think we have to do a bone marrow test on him.  He does not need to be phlebotomized today.  I think we can probably get him through the summertime now. I will then plan see him back right after Labor Day and I'm sure he'll need to be phlebotomized then.  He will be going to the beach. I told him to make sure that he stays well hydrated, and wears sunscreen.   ENNEVER,PETER R,  MD 6/15/20162:50 PM

## 2014-09-04 ENCOUNTER — Other Ambulatory Visit: Payer: Self-pay | Admitting: Internal Medicine

## 2014-09-04 LAB — IRON AND TIBC CHCC
%SAT: 24 % (ref 20–55)
IRON: 89 ug/dL (ref 42–163)
TIBC: 371 ug/dL (ref 202–409)
UIBC: 282 ug/dL (ref 117–376)

## 2014-09-04 LAB — FERRITIN CHCC: Ferritin: 62 ng/ml (ref 22–316)

## 2014-09-05 ENCOUNTER — Encounter: Payer: Self-pay | Admitting: Endocrinology

## 2014-09-05 ENCOUNTER — Ambulatory Visit (INDEPENDENT_AMBULATORY_CARE_PROVIDER_SITE_OTHER): Payer: Medicare Other | Admitting: Endocrinology

## 2014-09-05 ENCOUNTER — Other Ambulatory Visit: Payer: Self-pay | Admitting: *Deleted

## 2014-09-05 VITALS — BP 128/60 | HR 65 | Temp 98.1°F | Resp 14 | Ht 72.0 in | Wt 250.4 lb

## 2014-09-05 DIAGNOSIS — E1149 Type 2 diabetes mellitus with other diabetic neurological complication: Secondary | ICD-10-CM | POA: Diagnosis not present

## 2014-09-05 DIAGNOSIS — N183 Chronic kidney disease, stage 3 unspecified: Secondary | ICD-10-CM

## 2014-09-05 DIAGNOSIS — E785 Hyperlipidemia, unspecified: Secondary | ICD-10-CM | POA: Diagnosis not present

## 2014-09-05 MED ORDER — INSULIN LISPRO 100 UNIT/ML (KWIKPEN): 8.0000 [IU] | PEN_INJECTOR | Freq: Two times a day (BID) | SUBCUTANEOUS | Status: DC

## 2014-09-05 NOTE — Patient Instructions (Addendum)
Check cost of V-go   Relion N insulin replaces Levemir and start with 10 units around 10 am and 8 at bedtime If starting this will need to see me after 2 weeks  May take upto 5 Gabapentin

## 2014-09-05 NOTE — Progress Notes (Signed)
Patient ID: [name], male   DOB: [birth year], 74 y.o.   MRN: [id number]    Reason for Appointment : F/u for Type 2 Diabetes  History of Present Illness          Diagnosis: Type 2 diabetes mellitus, date of diagnosis: 2008       Past history: He was initially treated with Metformin and also given Amayl at some point. With this he had fair control of his diabetes with A1c had usually been over 7% except once in 2013.  He was taken off metformin probably in March this year after a hospitalization, possibly because of renal dysfunction He was started on Levemir insulin in May when his blood sugars were significantly higher, fasting readings averaging 220.  Levemir progressively increased and was on 35 units at night since 12/20/12 He has not been on any other medications either orally or injectable for his diabetes Because of his A1c of over 10% in 10/14 on basal insulin alone he was started on NovoLog with each meal in 10/14  Recent history:    His blood sugars have been more stable and over the last few months he has required lower doses of insulin especially Levemir  However his renal function has fluctuated and is  Overall slightly worse  A1c is stable at 6.6%    Current blood sugar patterns and problems identified:   His fasting blood sugars are extremely stable and averaging about 120 without any overnight hypoglycemia , his Levemir was reduced to 22 units on the last visit at night   he has mild sporadic increase in postprandial readings after lunch and supper but generally blood sugars are within range ; taking a little less Humalog for lunch compared to supper   no hypoglycemia at any time   Again he has difficulty losing weight and is not able to do much exercise  His wife is usually helping him watch his diet  INSULIN regimen is described as:  22 Levemir hs Humalog 8 lunch--10 units ac supper  Glucose monitoring:  done  Up to 2  times a day         Glucometer:  Accu-Chek    Blood Glucose readings from his download recently  Mean values apply above for all meters except median for One Touch  PRE-MEAL Fasting  PCL  PCS Bedtime Overall  Glucose range:  99- 131  111-210  107- 174  122-185   Mean/median:  120  150  131   134   Hypoglycemia frequency:   None  Self-care: The diet that the patient has been following is: Smaller portions and relatively balanced meals  Meals: 2 meals per day. Noon and 6 pm  Low fat, trying to eat more salads. Avoiding drinks with sugar and no juices       Physical activity: exercise: Minimal, limited by back pain           Dietician visit: Most recent: Unknown.  CDE visit: 11/14               Compliance with the medical regimen: Good  Retinal exam: Most recent: 6/14, reportedly normal  Weight control: This appears to be fluctuating significantly  Wt Readings from Last 3 Encounters:  09/05/14 250 lb 6.4 oz (113.581 kg)  09/03/14 253 lb (114.76 kg)  08/26/14 249 lb 9.6 oz (113.218 kg)    Lab Results  Component Value Date   HGBA1C 6.6* 09/02/2014   HGBA1C 6.4* 05/05/2014  HGBA1C 7.1* 03/03/2014   Lab Results  Component Value Date   MICROALBUR 1.6 09/02/2014   LDLCALC 90 09/02/2014   CREATININE 2.07* 09/03/2014       Medication List       This list is accurate as of: 09/05/14 11:59 PM.  Always use your most recent med list.               ACCU-CHEK FASTCLIX LANCETS Misc  USE ONE LANCET TO CHECK BLOOD SUGAR THREE TIMES DAILY     ACCU-CHEK SMARTVIEW test strip  Generic drug:  glucose blood     acetaminophen 325 MG tablet  Commonly known as:  TYLENOL  Take 975 mg by mouth every morning.     allopurinol 100 MG tablet  Commonly known as:  ZYLOPRIM  TAKE ONE TABLET BY MOUTH ONCE DAILY     amLODipine 5 MG tablet  Commonly known as:  NORVASC  Take 2.5 mg by mouth daily.     aspirin 81 MG tablet  Take 81 mg by mouth once a week.     atorvastatin 10 MG tablet  Commonly known as:   LIPITOR  TAKE ONE TABLET BY MOUTH ONCE DAILY     carboxymethylcellulose 0.5 % Soln  Commonly known as:  REFRESH PLUS  Place 2 drops into both eyes daily.     carvedilol 6.25 MG tablet  Commonly known as:  COREG  TAKE ONE TABLET BY MOUTH TWICE DAILY     chlorthalidone 25 MG tablet  Commonly known as:  HYGROTON  Take when weight reaches 244 lbs. or greater only     Cholecalciferol 10000 UNITS Caps  Take 1,000 Units by mouth daily.     colchicine 0.6 MG tablet  TAKE ONE TABLET BY MOUTH AS NEEDED FOR  GOUT     furosemide 80 MG tablet  Commonly known as:  LASIX  Take 1 tablet (80 mg total) by mouth 2 (two) times daily.     gabapentin 300 MG capsule  Commonly known as:  NEURONTIN  3 x daily as needed for leg pain     insulin lispro 100 UNIT/ML KiwkPen  Commonly known as:  HUMALOG KWIKPEN  Inject 0.08-0.12 mLs (8-12 Units total) into the skin 2 (two) times daily.     ipratropium 0.06 % nasal spray  Commonly known as:  ATROVENT  Place 1 spray into both nostrils as directed.     LEVEMIR FLEXTOUCH 100 UNIT/ML Pen  Generic drug:  Insulin Detemir  INJECT 35 UNITS INTO THE SKIN EVERY NIGHT AT BEDTIME     losartan 50 MG tablet  Commonly known as:  COZAAR  Take 1 tablet (50 mg total) by mouth daily.     metFORMIN 750 MG 24 hr tablet  Commonly known as:  GLUCOPHAGE-XR  TAKE ONE TABLET BY MOUTH ONCE DAILY WITH BREAKFAST     naphazoline-pheniramine 0.025-0.3 % ophthalmic solution  Commonly known as:  NAPHCON-A  Place 1 drop into both eyes daily as needed for irritation.     nitroGLYCERIN 0.4 MG SL tablet  Commonly known as:  NITROSTAT  Place 0.4 mg under the tongue every 5 (five) minutes as needed for chest pain (MAX 3 TABLETS).     Potassium Chloride ER 20 MEQ Tbcr  Take 30 mcg by mouth 2 (two) times daily.     warfarin 2.5 MG tablet  Commonly known as:  COUMADIN  Take 2.5 mg daily except Wednesday, take 5 mg.  Allergies:  Allergies  Allergen Reactions  .  Avelox [Moxifloxacin Hcl In Nacl] Swelling, Rash and Other (See Comments)    Patient became hypotensive after infusion started Because of a history of documented adverse serious drug reaction;Medi Alert bracelet  is recommended  . Penicillins Anaphylaxis    REACTION: anaphylaxis Because of a history of documented adverse serious drug reaction;Medi Alert bracelet  is recommended    Past Medical History  Diagnosis Date  . Diabetes mellitus   . Hyperlipidemia   . Hypertension   . Pilonidal cyst   . Atrial fibrillation     Previous long-term amiodarone therapy with multiple cardioversions / amiodarone stopped September, 2009  . Atrial flutter     Started November, 2010, Left-sided and cannot ablate  . Left atrial thrombus     Remote past... cardioversions done since that time  . Wide-complex tachycardia   . Left ventricular ejection fraction less than 40%   . Gout   . AAA (abdominal aortic aneurysm)     Surgical repair  . Discolored skin   . S/P ICD (internal cardiac defibrillator) procedure     Dr. Lovena Le 2009... by the pacing  . SOB (shortness of breath)     Large left effusion/ thoracentesis/hospitalization/November, 2011... exudated.. cytology negative.. Dr.Wert.. no proof of mesothelioma  . Pericardial effusion   . Pleural effusion     Large loculated effusion on the left side November, 2011. This was tapped. It was exudative. Cytology revealed no cancer no proof of mesothelioma area pulmonary team felt that no further workup was needed  . S/P AAA repair   . Spinal stenosis     Surgery Dr.Elsner  . CAD (coronary artery disease)     Catheterization July, 2008... name and vein grafts patent but low cardiac output  . Warfarin anticoagulation   . Cardiomyopathy     Ischemic... ICD  . Combined systolic and diastolic CHF   . Venous insufficiency     Toe discoloration chronic  . Mitral regurgitation     Mild echo  . Aortic valve sclerosis   . Nasal drainage     Chronic  .  Alcohol ingestion of more than four drinks per week     Excess beer  not a dependency problem  . Chronotropic incompetence     IV pacing rate adjusted  . Thrombophlebitis of superficial veins of upper extremities     Possible venous stenosis from defibrillator  . Pericardial effusion     November, 2011 .. decreased during hospitalization  . Eye abnormality     Ophthalmologist questions a clot in one of his eyes, May, 2012  . Overweight(278.02)     November, 2012  . Pleural thickening   . Ejection fraction     Ejection fraction has varied over time from 35-50%.,, Echoes are technically very difficult,,, EF 50%, echo, May 25, 2011, technically very difficult  . Drug therapy     Redness and swelling with Avelox infusion May 24, 2011  . COPD (chronic obstructive pulmonary disease)   . Myocardial infarction   . Spinal cord stimulator status     October, 2013  . Carotid artery disease     Doppler, December, 2013, 0-39% bilateral  . Pneumonia   . Arthritis   . Ventral hernia     April, 2014, result of his abdominal surgery  . Bony abnormality     Patient's manubrium is slightly displaced to the right  . Ascending aortic aneurysm 05/10/2014  . 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 Internal hemorrhoids   . Diverticulosis   . Adenomatous colon polyp     Past Surgical History  Procedure Laterality Date  . Colonoscopy w/ polypectomy    . Lumbar fusion    . Pilonidal cyst removal    . Surgery scrotal / testicular    . Coronary artery bypass graft  2004  . Abdominal aortic aneurysm repair  11/2002  . Bi-ventricular pacemaker insertion (crt-p)  02-11-2013    Pt with previously implanted MDT CRTD downgraded to CRTP by Dr Lovena Le 02-11-13  . Back surgery    . Incision and drainage abscess / hematoma of bursa / knee / thigh    . Implantable cardioverter defibrillator (icd) generator change N/A 02/11/2013    Procedure: ICD GENERATOR CHANGE;  Surgeon: Evans Lance, MD;  Location: [facility] CATH LAB;   Service: Cardiovascular;  Laterality: N/A;  . Leg surgery Right     Family History  Problem Relation Age of Onset  . Hypertension Mother   . Stroke Mother   . Diabetes Father   . Coronary artery disease Father   . Other Father     DVT  . Colon cancer Neg Hx   . Diabetes Brother     Social History:  reports that he quit smoking about 19 years ago. His smoking use included Cigarettes. He started smoking about 58 years ago. He has a 117 pack-year smoking history. He has never used smokeless tobacco. He reports that he drinks about 1.2 oz of alcohol per week. He reports that he does not use illicit drugs.    Review of Systems       Lipids: Treated with Lipitor, currently only taking 10 mg Hyperlipidemia: LDL is  Now under over 100  And he is being managed by PCP/cardiologist   Lab Results  Component Value Date   CHOL 152 09/02/2014   HDL 40.00 09/02/2014   LDLCALC 90 09/02/2014   TRIG 112.0 09/02/2014   CHOLHDL 4 09/02/2014       The blood pressure has been under control with current regimen of chlorthalidone, amlodipine and losartan, BP at home usually normal Also he takes extra chlorthalidone when he has weight gain  Renal dysfunction: Creatinine has been quite variable , probably related to his volume status    Has known neuropathy: He has had pains and tingling in his feet and lower legs.  Mostly having some symptoms in day;  Now is complaining of increasing pain on the the top of his feet in the middle area.  This is the worst when he gets up in the morning and starts walking and also feels stiff He is taking gabapentin  3 times a day  HYPOKALEMIA:  Controlled now    LABS:  Appointment on 09/03/2014  Component Date Value Ref Range Status  . WBC 09/03/2014 6.0  4.0 - 10.0 10e3/uL Final  . RBC 09/03/2014 4.84  4.20 - 5.70 10e6/uL Final  . HGB 09/03/2014 15.1  13.0 - 17.1 g/dL Final  . HCT 09/03/2014 44.3  38.7 - 49.9 % Final  . MCV 09/03/2014 92  82 - 98 fL Final   . MCH 09/03/2014 31.2  28.0 - 33.4 pg Final  . MCHC 09/03/2014 34.1  32.0 - 35.9 g/dL Final  . RDW 09/03/2014 15.3  11.1 - 15.7 % Final  . Platelets 09/03/2014 163  145 - 400 10e3/uL Final  . NEUT# 09/03/2014 3.8  1.5 - 6.5 10e3/uL Final  . LYMPH# 09/03/2014 1.3  0.9 - 3.3 10e3/uL Final  . MONO# 09/03/2014 0.7  0.1 - 0.9 10e3/uL Final  . Eosinophils Absolute 09/03/2014 0.1  0.0 - 0.5 10e3/uL Final  . BASO# 09/03/2014 0.0  0.0 - 0.2 10e3/uL Final  . NEUT% 09/03/2014 64.3  40.0 - 80.0 % Final  . LYMPH% 09/03/2014 21.8  14.0 - 48.0 % Final  . MONO% 09/03/2014 11.9  0.0 - 13.0 % Final  . EOS% 09/03/2014 1.5  0.0 - 7.0 % Final  . BASO% 09/03/2014 0.5  0.0 - 2.0 % Final  . Sodium 09/03/2014 140  135 - 145 mEq/L Final  . Potassium 09/03/2014 4.2  3.5 - 5.3 mEq/L Final  . Chloride 09/03/2014 104  96 - 112 mEq/L Final  . CO2 09/03/2014 25  19 - 32 mEq/L Final  . Glucose, Bld 09/03/2014 102* 70 - 99 mg/dL Final  . BUN 09/03/2014 34* 6 - 23 mg/dL Final  . Creatinine, Ser 09/03/2014 2.07* 0.50 - 1.35 mg/dL Final  . Total Bilirubin 09/03/2014 0.6  0.2 - 1.2 mg/dL Final  . Alkaline Phosphatase 09/03/2014 62  39 - 117 U/L Final  . AST 09/03/2014 11  0 - 37 U/L Final  . ALT 09/03/2014 8  0 - 53 U/L Final  . Total Protein 09/03/2014 6.1  6.0 - 8.3 g/dL Final  . Albumin 09/03/2014 3.7  3.5 - 5.2 g/dL Final  . Calcium 09/03/2014 8.9  8.4 - 10.5 mg/dL Final  . Ferritin 09/03/2014 62  22 - 316 ng/ml Final  . Iron 09/03/2014 89  42 - 163 ug/dL Final  . TIBC 09/03/2014 371  202 - 409 ug/dL Final  . UIBC 09/03/2014 282  117 - 376 ug/dL Final  . %SAT 09/03/2014 24  20 - 55 % Final  Lab on 09/02/2014  Component Date Value Ref Range Status  . Hgb A1c MFr Bld 09/02/2014 6.6* 4.6 - 6.5 % Final   Glycemic Control Guidelines for People with Diabetes:Non Diabetic:  <6%Goal of Therapy: <7%Additional Action Suggested:  >8%   . Sodium 09/02/2014 138  135 - 145 mEq/L Final  . Potassium 09/02/2014 4.1  3.5 - 5.1  mEq/L Final  . Chloride 09/02/2014 102  96 - 112 mEq/L Final  . CO2 09/02/2014 27  19 - 32 mEq/L Final  . Glucose, Bld 09/02/2014 131* 70 - 99 mg/dL Final  . BUN 09/02/2014 36* 6 - 23 mg/dL Final  . Creatinine, Ser 09/02/2014 1.62* 0.40 - 1.50 mg/dL Final  . Total Bilirubin 09/02/2014 0.8  0.2 - 1.2 mg/dL Final  . Alkaline Phosphatase 09/02/2014 60  39 - 117 U/L Final  . AST 09/02/2014 12  0 - 37 U/L Final  . ALT 09/02/2014 9  0 - 53 U/L Final  . Total Protein 09/02/2014 6.5  6.0 - 8.3 g/dL Final  . Albumin 09/02/2014 3.8  3.5 - 5.2 g/dL Final  . Calcium 09/02/2014 9.2  8.4 - 10.5 mg/dL Final  . GFR 09/02/2014 44.57* >60.00 mL/min Final  . Cholesterol 09/02/2014 152  0 - 200 mg/dL Final   ATP III Classification       Desirable:  < 200 mg/dL               Borderline High:  200 - 239 mg/dL          High:  > = 240 mg/dL  . Triglycerides 09/02/2014 112.0  0.0 - 149.0 mg/dL Final   Normal:  <150 mg/dLBorderline High:  150 - 199 mg/dL  .  HDL 09/02/2014 40.00  >39.00 mg/dL Final  . VLDL 09/02/2014 22.4  0.0 - 40.0 mg/dL Final  . LDL Cholesterol 09/02/2014 90  0 - 99 mg/dL Final  . Total CHOL/HDL Ratio 09/02/2014 4   Final                  Men          Women1/2 Average Risk     3.4          3.3Average Risk          5.0          4.42X Average Risk          9.6          7.13X Average Risk          15.0          11.0                      . NonHDL 09/02/2014 112.00   Final   NOTE:  Non-HDL goal should be 30 mg/dL higher than patient's LDL goal (i.e. LDL goal of < 70 mg/dL, would have non-HDL goal of < 100 mg/dL)  . Microalb, Ur 09/02/2014 1.6  0.0 - 1.9 mg/dL Final  . Creatinine,U 09/02/2014 97.7   Final  . Microalb Creat Ratio 09/02/2014 1.6  0.0 - 30.0 mg/g Final  . Color, Urine 09/02/2014 YELLOW  Yellow;Lt. Yellow Final  . APPearance 09/02/2014 CLEAR  Clear Final  . Specific Gravity, Urine 09/02/2014 1.010  1.000-1.030 Final  . pH 09/02/2014 6.0  5.0 - 8.0 Final  . Total Protein, Urine  09/02/2014 NEGATIVE  Negative Final  . Urine Glucose 09/02/2014 NEGATIVE  Negative Final  . Ketones, ur 09/02/2014 NEGATIVE  Negative Final  . Bilirubin Urine 09/02/2014 NEGATIVE  Negative Final  . Hgb urine dipstick 09/02/2014 NEGATIVE  Negative Final  . Urobilinogen, UA 09/02/2014 0.2  0.0 - 1.0 Final  . Leukocytes, UA 09/02/2014 NEGATIVE  Negative Final  . Nitrite 09/02/2014 NEGATIVE  Negative Final  . WBC, UA 09/02/2014 0-2/hpf  0-2/hpf Final  . RBC / HPF 09/02/2014 0-2/hpf  0-2/hpf Final  . Squamous Epithelial / LPF 09/02/2014 Rare(0-4/hpf)  Rare(0-4/hpf) Final    Physical Examination:  BP 128/60 mmHg  Pulse 65  Temp(Src) 98.1 F (36.7 C)  Resp 14  Ht 6' (1.829 m)  Wt 250 lb 6.4 oz (113.581 kg)  BMI 33.95 kg/m2  SpO2 95%  No ankle edema    ASSESSMENT/PLAN:  Diabetes type 2  His blood sugar control is much improved and A1c is stable at 6.6 See history of present illness for detailed discussion of his current management, blood sugar patterns and problems identified  He appears to be requiring fairly stable doses of insulin now.  Previously had required relatively larger doses of basal insulin   Although he is well controlled and fluctuations in blood sugars are relatively minimal considering his general medical status he is mostly concerned about the cost of his insulin, now being in the donut hole.  Since he wants to try a generic medication for mealtime insulin he can use the [facility] brand with the syringe for the NPH and this was left to be given a couple of hours before his lunch and at bedtime since he does not eat breakfast Doses guidelines given as below  Discussed that regular insulin will need to be taken 20-30 minutes before eating and may last longer, probably can  continue the same doses   He was also given the option of trying the V-go pump and discussed how this works in detail and how insulin will be used in this as well as the potential costs.  He is  interested in this and will look into the financial aspects.  If agreeable he can start this with the nurse educator  Meanwhile he can try Humalog with the free coupon  RENAL dysfunction:  He has variable levels although his potassium is normal now  NEUROPATHY: He is having more symptoms especially in his feet and more when he wakes up in the morning. To provide better relief of symptoms he can take 2 tablets of gabapentin at bedtime and up to 5 total a day as needed Other option would be to try Cymbalta or Lyrica  HYPERLIPIDEMIA:  His LDL is now below 100 but considering his risk factors he probably should be on a moderate dose statin at least   Patient Instructions  Check cost of V-go   Relion N insulin replaces Levemir and start with 10 units around 10 am and 8 at bedtime If starting this will need to see me after 2 weeks  May take upto 5 Gabapentin  Counseling time on subjects discussed above is over 50% of today's 25 minute visit  Lurleen Soltero 09/06/2014, 2:25 PM

## 2014-09-12 ENCOUNTER — Telehealth: Payer: Self-pay | Admitting: Cardiology

## 2014-09-12 NOTE — Telephone Encounter (Signed)
**Note De-identified Honore Wipperfurth Obfuscation** LMTCB

## 2014-09-12 NOTE — Telephone Encounter (Signed)
New Message       Pt's wife calling to speak to [name], will not state what she is calling about. Please call back and advise.

## 2014-09-15 NOTE — Telephone Encounter (Signed)
F/u    Pt's wife returning your call

## 2014-09-15 NOTE — Telephone Encounter (Signed)
**Note De-Identified [name] Obfuscation** The pts wife called to report the pts weights. Weights noted.

## 2014-09-16 ENCOUNTER — Ambulatory Visit (INDEPENDENT_AMBULATORY_CARE_PROVIDER_SITE_OTHER): Payer: Medicare Other | Admitting: General Practice

## 2014-09-16 ENCOUNTER — Telehealth: Payer: Self-pay | Admitting: Internal Medicine

## 2014-09-16 ENCOUNTER — Ambulatory Visit (INDEPENDENT_AMBULATORY_CARE_PROVIDER_SITE_OTHER): Payer: Medicare Other | Admitting: Internal Medicine

## 2014-09-16 ENCOUNTER — Encounter: Payer: Self-pay | Admitting: Internal Medicine

## 2014-09-16 VITALS — BP 136/78 | HR 80 | Temp 97.7°F | Resp 20 | Wt 251.0 lb

## 2014-09-16 DIAGNOSIS — J310 Chronic rhinitis: Secondary | ICD-10-CM | POA: Diagnosis not present

## 2014-09-16 DIAGNOSIS — I4891 Unspecified atrial fibrillation: Secondary | ICD-10-CM

## 2014-09-16 DIAGNOSIS — Z7901 Long term (current) use of anticoagulants: Secondary | ICD-10-CM

## 2014-09-16 DIAGNOSIS — J209 Acute bronchitis, unspecified: Secondary | ICD-10-CM

## 2014-09-16 LAB — POCT INR: INR: 2.3

## 2014-09-16 MED ORDER — SULFAMETHOXAZOLE-TRIMETHOPRIM 800-160 MG PO TABS: 1.0000 | ORAL_TABLET | Freq: Two times a day (BID) | ORAL | Status: DC

## 2014-09-16 NOTE — Progress Notes (Signed)
Pre visit review using our clinic review tool, if applicable. No additional management support is needed unless otherwise documented below in the visit note. 

## 2014-09-16 NOTE — Patient Instructions (Signed)

## 2014-09-16 NOTE — Telephone Encounter (Signed)
Patient was in today and was prescribed sulfamethoxazole-trimethoprim (BACTRIM DS,SEPTRA DS) 800-160 MG per tablet [[id number] Pharmacy called patient and there may be some kind of interaction with this medication.  Patient is leaving town tomorrow morning and is needing to get resolved this afternoon. Please call patient to let her know

## 2014-09-16 NOTE — Progress Notes (Signed)
   Subjective:    Patient ID: [name], male    DOB: December 03, 1940, 74 y.o.   MRN: [id number]  HPI  09/13/14 symptoms started as sore throat followed by nonproductive cough. He's had associated postnasal drainage as well as sneezing. He's used Mucinex and a long-acting allergy pill with partial improvement.  He has no other upper respiratory tract infection or extrinsic symptoms.  He has a history of emphysema and severe community-acquired pneumonia.  He is on warfarin and has his PT/INR checked every 6 weeks. He has allergies to penicillin as well as Avelox.  Review of Systems He denies fever, chills, sweats, otic pain, otic discharge, frontal headache, facial pain, or nasal purulence. He has no itchy, watery eyes, wheezing, or shortness of breath.    Objective:   Physical Exam   General appearance:Adequately nourished; no acute distress or increased work of breathing is present.  He is deeply tanned.  Lymphatic: No  lymphadenopathy about the head, neck, or axilla .  Eyes: No conjunctival inflammation or lid edema is present. There is no scleral icterus.  Ears:  External ear exam shows no significant lesions or deformities.  Hearing aids present bilaterally  Nose:  External nasal examination shows no deformity or inflammation. Nasal mucosa are erythematous without lesions or exudates No septal dislocation or deviation.No obstruction to airflow.   Oral exam: Dentures; lips and gums are healthy appearing.There is no oropharyngeal erythema or exudate .  Neck:  No deformities, thyromegaly, masses, or tenderness noted.      Heart:  Distant heart sounds present. Irregular rate and rhythm. S1 and S2 normal without gallop, murmur, click, rub or other extra sounds.   Lungs: Breath sounds decreased Chest clear to auscultation; no wheezes, rhonchi,rales ,or rubs present.  Abdomen protuberant  Extremities:  No cyanosis, edema, or clubbing  noted    Skin: Warm & dry w/o tenting  or jaundice. No significant lesions or rash.       Assessment & Plan:  #1 acute bronchitis in the setting of COPD  #2 warfarin therapy;PT/INR 2.3  #3 documented allergies to penicillin and Avelox  #4 pending extensive sun exposure  Plan: See orders and after visit summary.

## 2014-09-16 NOTE — Telephone Encounter (Signed)
i have called [facility] and patient to advise that antibiotics can interfere with coumadin/warfarin, but patient is having INR value monitored regularly so we can adjust coumadin/warfarin if necessary

## 2014-09-16 NOTE — Telephone Encounter (Signed)
[name] from [facility] [address] stated that the Bactrim medication, interacts with patient [name] would increase bleeding, please advise [phone]

## 2014-09-16 NOTE — Telephone Encounter (Signed)
i have called [facility] pharmacist and also patient to advise that antibiotics can affect coumadin/warfarin, but patient is monitoring INR value regularly so we will know if coumadin/warfarin dosage needs to be recorrected

## 2014-09-22 NOTE — Progress Notes (Signed)
I have reviewed and agree with the plan. 

## 2014-09-30 DIAGNOSIS — H6122 Impacted cerumen, left ear: Secondary | ICD-10-CM | POA: Diagnosis not present

## 2014-10-02 ENCOUNTER — Telehealth: Payer: Self-pay | Admitting: Cardiology

## 2014-10-02 NOTE — Telephone Encounter (Signed)
No answer. I left message stating that I will return call tomorrow.

## 2014-10-02 NOTE — Telephone Encounter (Signed)
New Message  Pt calling to speak w/ RN- did not specify. Please call back and discuss.

## 2014-10-07 ENCOUNTER — Encounter: Payer: Self-pay | Admitting: Cardiology

## 2014-10-07 ENCOUNTER — Ambulatory Visit (INDEPENDENT_AMBULATORY_CARE_PROVIDER_SITE_OTHER): Payer: Medicare Other | Admitting: *Deleted

## 2014-10-07 ENCOUNTER — Ambulatory Visit (INDEPENDENT_AMBULATORY_CARE_PROVIDER_SITE_OTHER): Payer: Medicare Other | Admitting: Internal Medicine

## 2014-10-07 ENCOUNTER — Ambulatory Visit (INDEPENDENT_AMBULATORY_CARE_PROVIDER_SITE_OTHER): Payer: Medicare Other | Admitting: Cardiology

## 2014-10-07 ENCOUNTER — Encounter: Payer: Self-pay | Admitting: Internal Medicine

## 2014-10-07 VITALS — BP 132/84 | HR 77 | Temp 97.6°F | Resp 16 | Ht 72.0 in | Wt 246.0 lb

## 2014-10-07 VITALS — BP 130/80 | HR 75 | Ht 72.0 in | Wt 250.1 lb

## 2014-10-07 DIAGNOSIS — N183 Chronic kidney disease, stage 3 unspecified: Secondary | ICD-10-CM

## 2014-10-07 DIAGNOSIS — Z23 Encounter for immunization: Secondary | ICD-10-CM | POA: Diagnosis not present

## 2014-10-07 DIAGNOSIS — I4891 Unspecified atrial fibrillation: Secondary | ICD-10-CM | POA: Diagnosis not present

## 2014-10-07 DIAGNOSIS — I255 Ischemic cardiomyopathy: Secondary | ICD-10-CM | POA: Diagnosis not present

## 2014-10-07 DIAGNOSIS — Z7901 Long term (current) use of anticoagulants: Secondary | ICD-10-CM

## 2014-10-07 DIAGNOSIS — I5022 Chronic systolic (congestive) heart failure: Secondary | ICD-10-CM | POA: Diagnosis not present

## 2014-10-07 DIAGNOSIS — I2510 Atherosclerotic heart disease of native coronary artery without angina pectoris: Secondary | ICD-10-CM | POA: Diagnosis not present

## 2014-10-07 DIAGNOSIS — E785 Hyperlipidemia, unspecified: Secondary | ICD-10-CM | POA: Diagnosis not present

## 2014-10-07 DIAGNOSIS — D45 Polycythemia vera: Secondary | ICD-10-CM | POA: Diagnosis not present

## 2014-10-07 DIAGNOSIS — Z0000 Encounter for general adult medical examination without abnormal findings: Secondary | ICD-10-CM

## 2014-10-07 DIAGNOSIS — E038 Other specified hypothyroidism: Secondary | ICD-10-CM | POA: Diagnosis not present

## 2014-10-07 LAB — POCT INR: INR: 2.2

## 2014-10-07 MED ORDER — WARFARIN SODIUM 2.5 MG PO TABS: ORAL_TABLET | ORAL | Status: DC

## 2014-10-07 NOTE — Progress Notes (Signed)
Cardiology Office Note   Date:  10/07/2014   ID:  [name], DOB Jul 26, 1940, MRN [id number]  PCP:  [name], MD  Cardiologist:  Dola Argyle, MD   Chief Complaint  Patient presents with  . Appointment    Follow-up cardiomyopathy      History of Present Illness: [name] is a 74 y.o. male who presents today to follow up coronary disease and cardiomyopathy. He is actually doing very well. He follows his weight carefully and adjust his diuretics accordingly. His blood pressure is under good control. His heart rate is controlled. He is not having any significant shortness of breath. He has been receiving phlebotomy approximately once every 3 months. He feels that this helps him substantially.    Past Medical History  Diagnosis Date  . Diabetes mellitus   . Hyperlipidemia   . Hypertension   . Pilonidal cyst   . Atrial fibrillation     Previous long-term amiodarone therapy with multiple cardioversions / amiodarone stopped September, 2009  . Atrial flutter     Started November, 2010, Left-sided and cannot ablate  . Left atrial thrombus     Remote past... cardioversions done since that time  . Wide-complex tachycardia   . Left ventricular ejection fraction less than 40%   . Gout   . AAA (abdominal aortic aneurysm)     Surgical repair  . Discolored skin   . S/P ICD (internal cardiac defibrillator) procedure     Dr. Lovena Le 2009... by the pacing  . SOB (shortness of breath)     Large left effusion/ thoracentesis/hospitalization/November, 2011... exudated.. cytology negative.. Dr.Wert.. no proof of mesothelioma  . Pericardial effusion   . Pleural effusion     Large loculated effusion on the left side November, 2011. This was tapped. It was exudative. Cytology revealed no cancer no proof of mesothelioma area pulmonary team felt that no further workup was needed  . S/P AAA repair   . Spinal stenosis     Surgery Dr.Elsner  . CAD (coronary artery disease)       Catheterization July, 2008... name and vein grafts patent but low cardiac output  . Warfarin anticoagulation   . Cardiomyopathy     Ischemic... ICD  . Combined systolic and diastolic CHF   . Venous insufficiency     Toe discoloration chronic  . Mitral regurgitation     Mild echo  . Aortic valve sclerosis   . Nasal drainage     Chronic  . Alcohol ingestion of more than four drinks per week     Excess beer  not a dependency problem  . Chronotropic incompetence     IV pacing rate adjusted  . Thrombophlebitis of superficial veins of upper extremities     Possible venous stenosis from defibrillator  . Pericardial effusion     November, 2011 .. decreased during hospitalization  . Eye abnormality     Ophthalmologist questions a clot in one of his eyes, May, 2012  . Overweight(278.02)     November, 2012  . Pleural thickening   . Ejection fraction     Ejection fraction has varied over time from 35-50%.,, Echoes are technically very difficult,,, EF 50%, echo, May 25, 2011, technically very difficult  . Drug therapy     Redness and swelling with Avelox infusion May 24, 2011  . COPD (chronic obstructive pulmonary disease)   . Myocardial infarction   . Spinal cord stimulator status     October, 2013  .  Carotid artery disease     Doppler, December, 2013, 0-39% bilateral  . Pneumonia   . Arthritis   . Ventral hernia     April, 2014, result of his abdominal surgery  . Bony abnormality     Patient's manubrium is slightly displaced to the right  . Ascending aortic aneurysm 05/10/2014  . 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 Internal hemorrhoids   . Diverticulosis   . Adenomatous colon polyp     Past Surgical History  Procedure Laterality Date  . Colonoscopy w/ polypectomy    . Lumbar fusion    . Pilonidal cyst removal    . Surgery scrotal / testicular    . Coronary artery bypass graft  2004  . Abdominal aortic aneurysm repair  11/2002  . Bi-ventricular pacemaker insertion (crt-p)   02-11-2013    Pt with previously implanted MDT CRTD downgraded to CRTP by Dr Lovena Le 02-11-13  . Back surgery    . Incision and drainage abscess / hematoma of bursa / knee / thigh    . Implantable cardioverter defibrillator (icd) generator change N/A 02/11/2013    Procedure: ICD GENERATOR CHANGE;  Surgeon: Evans Lance, MD;  Location: [facility] CATH LAB;  Service: Cardiovascular;  Laterality: N/A;  . Leg surgery Right     Patient Active Problem List   Diagnosis Date Noted  . 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 Ascending aortic aneurysm 05/10/2014  . Hypoxia 05/05/2014  . COPD exacerbation 05/05/2014  . Diabetes 05/05/2014  . Chronic systolic CHF (congestive heart failure) 03/07/2014  . History of DVT (deep vein thrombosis) 03/07/2014  . Cardiomyopathy, ischemic 02/05/2014  . Pleural plaque without asbestosis 01/23/2014  . Elevated hemoglobin 01/14/2014  . CKD (chronic kidney disease) stage 3, GFR 30-59 ml/min 01/14/2014  . Asbestos exposure 01/14/2014  . Shortness of breath 01/01/2014  . Abscess, scrotum 03/05/2013  . Ejection fraction   . Hypopotassemia 12/20/2012  . ACE-inhibitor cough 10/04/2012  . Ventral hernia   . Bony abnormality   . Carotid artery disease   . Spinal cord stimulator status   . 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 Atrial fibrillation   . Spinal stenosis   . CAD (coronary artery disease)   . Venous insufficiency   . Mitral regurgitation   . Aortic valve sclerosis   . Alcohol ingestion of more than four drinks per week   . Chronotropic incompetence   . Thrombophlebitis of superficial veins of upper extremities   . Pericardial effusion   . Overweight(278.02)   . Pleural effusion   . Hx of allergic drug reaction   . Hyperlipidemia   . Hypertension   . Atrial flutter   . Left atrial thrombus   . AAA (abdominal aortic aneurysm)   . S/P ICD (internal cardiac defibrillator) procedure   . Warfarin anticoagulation   . Diabetic  polyneuropathy 01/07/2010  . DIVERTICULOSIS, COLON 06/10/2009  . Uncontrolled diabetes mellitus with peripheral circulatory disorder 03/11/2009  . BACK PAIN, LUMBAR 06/12/2008  . 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 Acute gout 07/27/2006  . CORONARY ARTERY BYPASS GRAFT, HX OF 07/27/2006      Current Outpatient Prescriptions  Medication Sig Dispense Refill  . ACCU-CHEK FASTCLIX LANCETS MISC USE ONE LANCET TO CHECK BLOOD SUGAR THREE TIMES DAILY 102 each 5  . ACCU-CHEK SMARTVIEW test strip     . acetaminophen (TYLENOL) 325 MG tablet Take 975 mg by mouth every morning.    Marland Kitchen allopurinol (ZYLOPRIM) 100 MG tablet TAKE ONE  TABLET BY MOUTH ONCE DAILY 90 tablet 1  . amLODipine (NORVASC) 5 MG tablet Take 2.5 mg by mouth daily.    Marland Kitchen aspirin 81 MG tablet Take 81 mg by mouth once a week.     Marland Kitchen atorvastatin (LIPITOR) 10 MG tablet TAKE ONE TABLET BY MOUTH ONCE DAILY 90 tablet 0  . carboxymethylcellulose (REFRESH PLUS) 0.5 % SOLN Place 2 drops into both eyes daily.     . carvedilol (COREG) 6.25 MG tablet TAKE ONE TABLET BY MOUTH TWICE DAILY 180 tablet 1  . chlorthalidone (HYGROTON) 25 MG tablet Take when weight reaches 244 lbs. or greater only 90 tablet 3  . Cholecalciferol 10000 UNITS CAPS Take 1,000 Units by mouth daily.    . colchicine 0.6 MG tablet TAKE ONE TABLET BY MOUTH AS NEEDED FOR  GOUT 90 tablet 0  . furosemide (LASIX) 80 MG tablet Take 1 tablet (80 mg total) by mouth 2 (two) times daily. 90 tablet 3  . gabapentin (NEURONTIN) 300 MG capsule 3 x daily as needed for leg pain 270 capsule 3  . insulin detemir (LEVEMIR) 100 unit/ml SOLN Inject 22 Units into the skin at bedtime.    . insulin lispro (HUMALOG KWIKPEN) 100 UNIT/ML KiwkPen Inject 0.08-0.12 mLs (8-12 Units total) into the skin 2 (two) times daily. 5 pen 3  . ipratropium (ATROVENT) 0.06 % nasal spray Place 1 spray into both nostrils as directed.     Marland Kitchen losartan (COZAAR) 50 MG tablet Take 1 tablet (50 mg total) by mouth  daily. 90 tablet 3  . metFORMIN (GLUCOPHAGE-XR) 750 MG 24 hr tablet TAKE ONE TABLET BY MOUTH ONCE DAILY WITH BREAKFAST 90 tablet 1  . naphazoline-pheniramine (NAPHCON-A) 0.025-0.3 % ophthalmic solution Place 1 drop into both eyes daily as needed for irritation.    . nitroGLYCERIN (NITROSTAT) 0.4 MG SL tablet Place 0.4 mg under the tongue every 5 (five) minutes as needed for chest pain (MAX 3 TABLETS).     . Potassium Chloride ER 20 MEQ TBCR Take 30 mcg by mouth 2 (two) times daily. 60 tablet   . warfarin (COUMADIN) 2.5 MG tablet Take 2.5 mg daily except Wednesday, take 5 mg. 130 tablet 0   No current facility-administered medications for this visit.    Allergies:   Avelox and Penicillins    Social History:  The patient  reports that he quit smoking about 19 years ago. His smoking use included Cigarettes. He started smoking about 58 years ago. He has a 117 pack-year smoking history. He has never used smokeless tobacco. He reports that he drinks about 1.2 oz of alcohol per week. He reports that he does not use illicit drugs.   Family History:  The patient's family history includes Coronary artery disease in his father; Diabetes in his brother and father; Hypertension in his mother; Other in his father; Stroke in his mother. There is no history of Colon cancer.    ROS:  Please see the history of present illness.     Patient denies fever, chills, headache, sweats, rash, change in vision, change in hearing, chest pain, cough, nausea or vomiting, urinary symptoms. All other systems are reviewed and are negative.   PHYSICAL EXAM: VS:  BP 130/80 mmHg  Pulse 75  Ht 6' (1.829 m)  Wt 250 lb 1.9 oz (113.454 kg)  BMI 33.92 kg/m2  SpO2 96% , The patient is oriented to person time and place. Affect is normal. He is here with his wife as always. Head is atraumatic.  Sclera and conjunctiva are normal. There is no jugulovenous distention. Lungs are clear. Respiratory effort is nonlabored. Cardiac exam  reveals S1 and S2. The abdomen is protuberant as always. There is no peripheral edema. There are no musculoskeletal deformities. There are no skin rashes. Neurologic is grossly intact.  EKG:   EKG is not done today.   Recent Labs: 05/05/2014: B Natriuretic Peptide 607.3* 05/08/2014: Magnesium 2.7* 05/14/2014: Pro B Natriuretic peptide (BNP) 670.0* 09/03/2014: ALT 8; BUN 34*; Creatinine, Ser 2.07*; HGB 15.1; Platelets 163; Potassium 4.2; Sodium 140    Lipid Panel    Component Value Date/Time   CHOL 152 09/02/2014 1034   TRIG 112.0 09/02/2014 1034   HDL 40.00 09/02/2014 1034   CHOLHDL 4 09/02/2014 1034   VLDL 22.4 09/02/2014 1034   LDLCALC 90 09/02/2014 1034      Wt Readings from Last 3 Encounters:  10/07/14 250 lb 1.9 oz (113.454 kg)  10/07/14 246 lb (111.585 kg)  09/16/14 251 lb (113.853 kg)      Current medicines are reviewed  The patient understands his medications.     ASSESSMENT AND PLAN:

## 2014-10-07 NOTE — Assessment & Plan Note (Signed)
The patient is on the appropriate medications that he can tolerate. No change in therapy.

## 2014-10-07 NOTE — Assessment & Plan Note (Signed)
It appeared that his renal function had been stable with a creatinine in the range of 1.65. There is confusing data from June 14 and June 15. On one day his creatinine was 1.6 and another day it was 2.07. There was no significant intervention between those 2 days. Chemistry lab will be checked today along with his thyroid function. We will then decide if it is time to refer him for nephrology evaluation and long-term care.

## 2014-10-07 NOTE — Patient Instructions (Signed)
Please discuss with Dr. [name] whether Nephrology referral was indicated.  TSH is the only lab which is not current. This can be done with your next blood draw. The code for high cholesterol would cover this test.

## 2014-10-07 NOTE — Patient Instructions (Signed)
10/11/14 take your last dose of Coumadin   10/17/14 Colonoscopy Date  Once you resume your coumadin take an extra 1/2 tablet for 2 doses then resume normal dose.  Appointment 1 week after procedure.

## 2014-10-07 NOTE — Patient Instructions (Addendum)
**Note De-Identified Cotton Beckley Obfuscation** Medication Instructions:  Same-no change  Labwork: BMET and TSH today  Testing/Procedures: None  Follow-Up: Your physician recommends that you schedule a follow-up appointment in: December 15, 2014 at 9 am

## 2014-10-07 NOTE — Assessment & Plan Note (Signed)
Coronary disease is stable.  No further workup. 

## 2014-10-07 NOTE — Progress Notes (Signed)
Pre visit review using our clinic review tool, if applicable. No additional management support is needed unless otherwise documented below in the visit note. 

## 2014-10-07 NOTE — Assessment & Plan Note (Signed)
Nephrology referral will be discussed with [name]

## 2014-10-07 NOTE — Assessment & Plan Note (Signed)
TSH not current

## 2014-10-07 NOTE — Progress Notes (Signed)
   Subjective:    Patient ID: [name], male    DOB: Oct 09, 1940, 74 y.o.   MRN: [id number]  HPI Medicare Wellness Visit: Psychosocial and medical history were reviewed as required by Medicare (history related to abuse, antisocial behavior , firearm risk). Social history: Caffeine: None Alcohol:  2 beers per week Tobacco use: Quit 1997 Exercise: Limited by COPD Personal safety/fall risk: No Limitations of activities of daily living: No Seatbelt/ smoke alarm use: Yes Healthcare Power of Attorney/Living Will status and End of Life process assessment : Needed; indication discussed Ophthalmologic exam status: Up-to-date Hearing evaluation status: Up-to-date Orientation: Oriented X 3 Memory and recall: Good Math testing: Good Depression/anxiety assessment: No Foreign travel history: [place] remotely Immunization status for influenza/pneumonia/ shingles /tetanus: TDAP given Transfusion history: No Preventive health care maintenance status: Colonoscopy as per protocol/standard care: Colonoscopy is pending Dental care: Complete dentures Chart reviewed and updated. Active issues reviewed and addressed as documented below.  The chart was reviewed; his creatinine is rising. Creatinine was 1.62 on 6/14 and 2.07 on 6/15.They're aware of the renal insufficiency which they believe is related to cardiac disease and medication effect.  He is receiving recurrent phlebotomies ; his hemoglobin had been 16.6.  The only lab which is not up-to-date is a TSH. This can be done at his next blood draw.    Review of Systems  Chest pain, palpitations, tachycardia, exertional dyspnea, paroxysmal nocturnal dyspnea, claudication or edema are absent. No unexplained weight loss, abdominal pain, significant dyspepsia, dysphagia, melena, rectal bleeding, or persistently small caliber stools. Dysuria, pyuria, hematuria, frequency, or polyuria are denied. Change in hair, skin, nails denied. No bowel  changes of constipation or diarrhea. No intolerance to heat or cold.      Objective:   Physical Exam  Pertinent or positive findings include: Pattern alopecia is present. He has bilateral hearing aids. Complete dentures are present. Breath sounds are slightly decreased. Heart rate is slow and irregular. Abdomen is markedly protuberant with a large ventral hernia. He has increased hyperpigmentation over the forearms. Pedal pulses are decreased, especially the posterior tibial pulses General appearance :adequately nourished; in no distress.  Eyes: No conjunctival inflammation or scleral icterus is present.  Oral exam:  Lips and gums are healthy appearing.There is no oropharyngeal erythema or exudate noted.   Heart:  S1 and S2 normal without gallop, murmur, click, rub or other extra sounds    Lungs:Chest clear to auscultation; no wheezes, rhonchi,rales ,or rubs present.No increased work of breathing.   Abdomen: bowel sounds normal, soft and non-tender without masses, organomegaly or hernias noted.  No guarding or rebound. No flank tenderness to percussion.  Vascular : all pulses equal ; no bruits present.  Skin:Warm & dry.  Intact without suspicious lesions or rashes ; no tenting or jaundice   Lymphatic: No lymphadenopathy is noted about the head, neck, axilla.   Neuro: Strength, tone normal. DTRs 1/2 + @ knees        Assessment & Plan:  See Current Assessment & Plan in Problem List under specific DiagnosisThe labs will be reviewed and risks and options assessed. Written recommendations will be provided by mail or directly through My Chart.Further evaluation or change in medical therapy will be directed by those results.

## 2014-10-08 LAB — TSH: TSH: 0.59 u[IU]/mL (ref 0.35–4.50)

## 2014-10-08 LAB — BASIC METABOLIC PANEL
BUN: 42 mg/dL — ABNORMAL HIGH (ref 6–23)
CALCIUM: 9.5 mg/dL (ref 8.4–10.5)
CHLORIDE: 99 meq/L (ref 96–112)
CO2: 29 mEq/L (ref 19–32)
CREATININE: 1.99 mg/dL — AB (ref 0.40–1.50)
GFR: 35.14 mL/min — AB (ref >60.00)
Glucose, Bld: 100 mg/dL — ABNORMAL HIGH (ref 70–99)
POTASSIUM: 3.7 meq/L (ref 3.5–5.1)
SODIUM: 139 meq/L (ref 135–145)

## 2014-10-13 ENCOUNTER — Telehealth: Payer: Self-pay | Admitting: Gastroenterology

## 2014-10-13 NOTE — Telephone Encounter (Signed)
Pt aware that the instructions for the prep remain the same the day prior.  She verbalized the instructions and had no further concerns

## 2014-10-17 ENCOUNTER — Encounter: Payer: Medicare Other | Admitting: Internal Medicine

## 2014-10-17 ENCOUNTER — Ambulatory Visit (AMBULATORY_SURGERY_CENTER): Payer: Medicare Other | Admitting: Gastroenterology

## 2014-10-17 ENCOUNTER — Encounter: Payer: Self-pay | Admitting: Gastroenterology

## 2014-10-17 ENCOUNTER — Other Ambulatory Visit: Payer: Self-pay

## 2014-10-17 ENCOUNTER — Telehealth: Payer: Self-pay | Admitting: Cardiology

## 2014-10-17 VITALS — BP 135/84 | HR 58 | Temp 98.0°F | Resp 20 | Ht 72.0 in | Wt 249.0 lb

## 2014-10-17 DIAGNOSIS — D123 Benign neoplasm of transverse colon: Secondary | ICD-10-CM

## 2014-10-17 DIAGNOSIS — Z86010 Personal history of colonic polyps: Secondary | ICD-10-CM | POA: Diagnosis present

## 2014-10-17 DIAGNOSIS — D124 Benign neoplasm of descending colon: Secondary | ICD-10-CM

## 2014-10-17 DIAGNOSIS — I5022 Chronic systolic (congestive) heart failure: Secondary | ICD-10-CM

## 2014-10-17 LAB — GLUCOSE, CAPILLARY
Glucose-Capillary: 107 mg/dL — ABNORMAL HIGH (ref 65–99)
Glucose-Capillary: 98 mg/dL (ref 65–99)

## 2014-10-17 MED ORDER — SODIUM CHLORIDE 0.9 % IV SOLN: 500.0000 mL | INTRAVENOUS | Status: DC

## 2014-10-17 NOTE — Progress Notes (Signed)
Report to PACU, RN, vss, BBS= Clear.  

## 2014-10-17 NOTE — Telephone Encounter (Signed)
The pts wife, Joaquim Lai, is advised of the pts recent lab results. She verbalized understanding and states she will notify the pt of his results as well.

## 2014-10-17 NOTE — Patient Instructions (Signed)
YOU HAD AN ENDOSCOPIC PROCEDURE TODAY AT [facility]:   Refer to the procedure report that was given to you for any specific questions about what was found during the examination.  If the procedure report does not answer your questions, please call your gastroenterologist to clarify.  If you requested that your care partner not be given the details of your procedure findings, then the procedure report has been included in a sealed envelope for you to review at your convenience later.  YOU SHOULD EXPECT: Some feelings of bloating in the abdomen. Passage of more gas than usual.  Walking can help get rid of the air that was put into your GI tract during the procedure and reduce the bloating. If you had a lower endoscopy (such as a colonoscopy or flexible sigmoidoscopy) you may notice spotting of blood in your stool or on the toilet paper. If you underwent a bowel prep for your procedure, you may not have a normal bowel movement for a few days.  Please Note:  You might notice some irritation and congestion in your nose or some drainage.  This is from the oxygen used during your procedure.  There is no need for concern and it should clear up in a day or so.  SYMPTOMS TO REPORT IMMEDIATELY:   Following lower endoscopy (colonoscopy or flexible sigmoidoscopy):  Excessive amounts of blood in the stool  Significant tenderness or worsening of abdominal pains  Swelling of the abdomen that is new, acute  Fever of 100F or higher  For urgent or emergent issues, a gastroenterologist can be reached at any hour by calling [phone].   DIET: Your first meal following the procedure should be a small meal and then it is ok to progress to your normal diet. Heavy or fried foods are harder to digest and may make you feel nauseous or bloated.  Likewise, meals heavy in dairy and vegetables can increase bloating.  Drink plenty of fluids but you should avoid alcoholic beverages for 24  hours.  ACTIVITY:  You should plan to take it easy for the rest of today and you should NOT DRIVE or use heavy machinery until tomorrow (because of the sedation medicines used during the test).    FOLLOW UP: Our staff will call the number listed on your records the next business day following your procedure to check on you and address any questions or concerns that you may have regarding the information given to you following your procedure. If we do not reach you, we will leave a message.  However, if you are feeling well and you are not experiencing any problems, there is no need to return our call.  We will assume that you have returned to your regular daily activities without incident.  If any biopsies were taken you will be contacted by phone or by letter within the next 1-3 weeks.  Please call us at [phone] if you have not heard about the biopsies in 3 weeks.    SIGNATURES/CONFIDENTIALITY: You and/or your care partner have signed paperwork which will be entered into your electronic medical record.  These signatures attest to the fact that that the information above on your After Visit Summary has been reviewed and is understood.  Full responsibility of the confidentiality of this discharge information lies with you and/or your care-partner.  Restart Coumadin tomorrow Colonoscopy possibly in 6 months per pathology report

## 2014-10-17 NOTE — Telephone Encounter (Signed)
Follow Up  Please return call

## 2014-10-17 NOTE — Op Note (Signed)
[place]  [address] [place], [id number]   COLONOSCOPY PROCEDURE REPORT  PATIENT: [name], [name]  MR#: [id number] BIRTHDATE: 11/22/1940 , 74  yrs. old GENDER: male ENDOSCOPIST: Milus Banister, MD REFERRED BY: PROCEDURE DATE:  10/17/2014 PROCEDURE:   Colonoscopy with snare polypectomy, Submucosal injection, any substance, and Colonoscopy, surveillance First Screening Colonoscopy - Avg.  risk and is 50 yrs.  old or older - No.  Prior Negative Screening - Now for repeat screening. N/A  History of Adenoma - Now for follow-up colonoscopy & has been > or = to 3 yrs.  Yes hx of adenoma.  Has been 3 or more years since last colonoscopy.  Polyps removed today? Yes ASA CLASS:   Class III INDICATIONS:Two small adenomas removed Dr.  Olevia Perches 2011. MEDICATIONS: Monitored anesthesia care and Propofol 270 mg IV  DESCRIPTION OF PROCEDURE:   After the risks benefits and alternatives of the procedure were thoroughly explained, informed consent was obtained.  The digital rectal exam revealed no abnormalities of the rectum.   The [id number]  endoscope was introduced through the anus and advanced to the cecum, which was identified by both the appendix and ileocecal valve. No adverse events experienced.   The quality of the prep was good.  The instrument was then slowly withdrawn as the colon was fully examined. Estimated blood loss is zero unless otherwise noted in this procedure report.      COLON FINDINGS: Six polyps were found, removed and sent to pathology.  The largest was heaped up, soft, 3cm across, located in proximal transverse segment, removed in piecemeal fashion with snare/cautery and sent to pathology (jar 1).  The polypectomy site was labled with injection of SPOT following resection.  The other five were sessile, located in transverse, descending segments, ranged in size from 4-57mm across, were removed with cold snare, sent to pathology (jar 2).   The examination was otherwise normal. Retroflexed views revealed no abnormalities. The time to cecum = 5.6 Withdrawal time = 26.2   The scope was withdrawn and the procedure completed. COMPLICATIONS: There were no immediate complications.  ENDOSCOPIC IMPRESSION: Six polyps were found, removed and sent to pathology.  The largest was heaped up, soft, 3cm across, located in proximal transverse segment, removed in piecemeal fashion with snare/cautery and sent to pathology (jar 1).  The polypectomy site was labled with injection of SPOT following resection.  The other five were sessile, located in transverse, descending segments, ranged in size from 4-57mm across, were removed with cold snare, sent to pathology (jar 2).  The examination was otherwise normal.  RECOMMENDATIONS: You will likely need repeat colonoscopy in 6 months.  Await final pathology result.  OK to start your coumadin tomorrow.  eSigned:  Milus Banister, MD 10/17/2014 11:48 AM    PATIENT NAME:  [name], [name] MR#: [id number]

## 2014-10-17 NOTE — Progress Notes (Signed)
Called to room to assist during endoscopic procedure.  Patient ID and intended procedure confirmed with present staff. Received instructions for my participation in the procedure from the performing physician.  

## 2014-10-20 ENCOUNTER — Telehealth: Payer: Self-pay | Admitting: *Deleted

## 2014-10-20 NOTE — Telephone Encounter (Signed)
  Follow up Call-  Call back number 10/17/2014  Post procedure Call Back phone  # [phone] wifes cell phone  Permission to leave phone message Yes     Patient questions:  Left message to call if necessary.

## 2014-10-22 DIAGNOSIS — M5417 Radiculopathy, lumbosacral region: Secondary | ICD-10-CM | POA: Diagnosis not present

## 2014-10-22 DIAGNOSIS — M461 Sacroiliitis, not elsewhere classified: Secondary | ICD-10-CM | POA: Diagnosis not present

## 2014-10-22 DIAGNOSIS — M9903 Segmental and somatic dysfunction of lumbar region: Secondary | ICD-10-CM | POA: Diagnosis not present

## 2014-10-22 DIAGNOSIS — M9905 Segmental and somatic dysfunction of pelvic region: Secondary | ICD-10-CM | POA: Diagnosis not present

## 2014-10-22 DIAGNOSIS — M9904 Segmental and somatic dysfunction of sacral region: Secondary | ICD-10-CM | POA: Diagnosis not present

## 2014-10-23 ENCOUNTER — Other Ambulatory Visit: Payer: Self-pay

## 2014-10-23 ENCOUNTER — Encounter: Payer: Self-pay | Admitting: Gastroenterology

## 2014-10-23 DIAGNOSIS — I5022 Chronic systolic (congestive) heart failure: Secondary | ICD-10-CM

## 2014-10-24 ENCOUNTER — Other Ambulatory Visit: Payer: Self-pay

## 2014-10-24 ENCOUNTER — Ambulatory Visit (INDEPENDENT_AMBULATORY_CARE_PROVIDER_SITE_OTHER): Payer: Medicare Other | Admitting: *Deleted

## 2014-10-24 DIAGNOSIS — Z7901 Long term (current) use of anticoagulants: Secondary | ICD-10-CM

## 2014-10-24 DIAGNOSIS — I4891 Unspecified atrial fibrillation: Secondary | ICD-10-CM

## 2014-10-24 LAB — POCT INR: INR: 1.5

## 2014-10-24 MED ORDER — FUROSEMIDE 80 MG PO TABS: 80.0000 mg | ORAL_TABLET | Freq: Two times a day (BID) | ORAL | Status: DC

## 2014-11-05 ENCOUNTER — Other Ambulatory Visit (INDEPENDENT_AMBULATORY_CARE_PROVIDER_SITE_OTHER): Payer: Medicare Other | Admitting: *Deleted

## 2014-11-05 ENCOUNTER — Ambulatory Visit (INDEPENDENT_AMBULATORY_CARE_PROVIDER_SITE_OTHER): Payer: Medicare Other | Admitting: *Deleted

## 2014-11-05 DIAGNOSIS — I5022 Chronic systolic (congestive) heart failure: Secondary | ICD-10-CM

## 2014-11-05 DIAGNOSIS — I4891 Unspecified atrial fibrillation: Secondary | ICD-10-CM | POA: Diagnosis not present

## 2014-11-05 LAB — BASIC METABOLIC PANEL
BUN: 30 mg/dL — AB (ref 6–23)
CALCIUM: 9.3 mg/dL (ref 8.4–10.5)
CO2: 29 mEq/L (ref 19–32)
Chloride: 101 mEq/L (ref 96–112)
Creatinine, Ser: 1.55 mg/dL — ABNORMAL HIGH (ref 0.40–1.50)
GFR: 46.88 mL/min — ABNORMAL LOW (ref >60.00)
GLUCOSE: 172 mg/dL — AB (ref 70–99)
POTASSIUM: 3.9 meq/L (ref 3.5–5.1)
Sodium: 140 mEq/L (ref 135–145)

## 2014-11-05 LAB — POCT INR: INR: 1.7

## 2014-11-06 ENCOUNTER — Other Ambulatory Visit: Payer: Medicare Other

## 2014-11-13 ENCOUNTER — Other Ambulatory Visit: Payer: Self-pay

## 2014-11-13 ENCOUNTER — Telehealth: Payer: Self-pay | Admitting: Cardiology

## 2014-11-13 MED ORDER — CARVEDILOL 6.25 MG PO TABS: 6.2500 mg | ORAL_TABLET | Freq: Two times a day (BID) | ORAL | Status: DC

## 2014-11-13 NOTE — Telephone Encounter (Signed)
**Note De-Identified Isatu Macinnes Obfuscation** The pts wife, Joaquim Lai, called to report the pts weight for today (246 lbs).

## 2014-11-13 NOTE — Telephone Encounter (Signed)
New Message   Pt wife is calling to speak to [facility]

## 2014-11-17 ENCOUNTER — Ambulatory Visit (INDEPENDENT_AMBULATORY_CARE_PROVIDER_SITE_OTHER): Payer: Medicare Other

## 2014-11-17 DIAGNOSIS — Z7901 Long term (current) use of anticoagulants: Secondary | ICD-10-CM | POA: Diagnosis not present

## 2014-11-17 DIAGNOSIS — I4891 Unspecified atrial fibrillation: Secondary | ICD-10-CM | POA: Diagnosis not present

## 2014-11-17 LAB — POCT INR: INR: 2.5

## 2014-11-25 ENCOUNTER — Telehealth: Payer: Self-pay | Admitting: Cardiology

## 2014-11-25 ENCOUNTER — Encounter: Payer: Medicare Other | Admitting: *Deleted

## 2014-11-25 NOTE — Telephone Encounter (Signed)
LMOVM reminding pt to send remote transmission.   

## 2014-12-02 ENCOUNTER — Other Ambulatory Visit (INDEPENDENT_AMBULATORY_CARE_PROVIDER_SITE_OTHER): Payer: Medicare Other

## 2014-12-02 DIAGNOSIS — E1149 Type 2 diabetes mellitus with other diabetic neurological complication: Secondary | ICD-10-CM

## 2014-12-02 LAB — GLUCOSE, RANDOM: Glucose, Bld: 148 mg/dL — ABNORMAL HIGH (ref 70–99)

## 2014-12-02 LAB — HEMOGLOBIN A1C: HEMOGLOBIN A1C: 6.7 % — AB (ref 4.6–6.5)

## 2014-12-03 ENCOUNTER — Ambulatory Visit (HOSPITAL_BASED_OUTPATIENT_CLINIC_OR_DEPARTMENT_OTHER): Payer: Medicare Other

## 2014-12-03 ENCOUNTER — Ambulatory Visit (HOSPITAL_BASED_OUTPATIENT_CLINIC_OR_DEPARTMENT_OTHER): Payer: Medicare Other | Admitting: Hematology & Oncology

## 2014-12-03 ENCOUNTER — Telehealth: Payer: Self-pay | Admitting: Cardiology

## 2014-12-03 ENCOUNTER — Other Ambulatory Visit (HOSPITAL_BASED_OUTPATIENT_CLINIC_OR_DEPARTMENT_OTHER): Payer: Medicare Other

## 2014-12-03 ENCOUNTER — Encounter: Payer: Self-pay | Admitting: Hematology & Oncology

## 2014-12-03 ENCOUNTER — Other Ambulatory Visit: Payer: Medicare Other

## 2014-12-03 VITALS — BP 101/65 | HR 65

## 2014-12-03 VITALS — BP 106/61 | HR 68 | Temp 97.4°F | Resp 16 | Ht 72.0 in | Wt 252.0 lb

## 2014-12-03 DIAGNOSIS — D45 Polycythemia vera: Secondary | ICD-10-CM

## 2014-12-03 DIAGNOSIS — IMO0002 Reserved for concepts with insufficient information to code with codable children: Secondary | ICD-10-CM

## 2014-12-03 DIAGNOSIS — E1165 Type 2 diabetes mellitus with hyperglycemia: Secondary | ICD-10-CM

## 2014-12-03 DIAGNOSIS — D582 Other hemoglobinopathies: Secondary | ICD-10-CM

## 2014-12-03 DIAGNOSIS — E1151 Type 2 diabetes mellitus with diabetic peripheral angiopathy without gangrene: Secondary | ICD-10-CM

## 2014-12-03 LAB — CBC WITH DIFFERENTIAL (CANCER CENTER ONLY)
BASO#: 0 10*3/uL (ref 0.0–0.2)
BASO%: 0.4 % (ref 0.0–2.0)
EOS%: 0.6 % (ref 0.0–7.0)
Eosinophils Absolute: 0.1 10*3/uL (ref 0.0–0.5)
HEMATOCRIT: 49 % (ref 38.7–49.9)
HGB: 16.7 g/dL (ref 13.0–17.1)
LYMPH#: 1.5 10*3/uL (ref 0.9–3.3)
LYMPH%: 18 % (ref 14.0–48.0)
MCH: 30.9 pg (ref 28.0–33.4)
MCHC: 34.1 g/dL (ref 32.0–35.9)
MCV: 91 fL (ref 82–98)
MONO#: 0.8 10*3/uL (ref 0.1–0.9)
MONO%: 9.5 % (ref 0.0–13.0)
NEUT#: 5.9 10*3/uL (ref 1.5–6.5)
NEUT%: 71.5 % (ref 40.0–80.0)
PLATELETS: 166 10*3/uL (ref 145–400)
RBC: 5.4 10*6/uL (ref 4.20–5.70)
RDW: 14.7 % (ref 11.1–15.7)
WBC: 8.2 10*3/uL (ref 4.0–10.0)

## 2014-12-03 LAB — BASIC METABOLIC PANEL (CC13)
Anion Gap: 13 mEq/L — ABNORMAL HIGH (ref 3–11)
BUN: 49.5 mg/dL — AB (ref 7.0–26.0)
CHLORIDE: 98 meq/L (ref 98–109)
CO2: 29 meq/L (ref 22–29)
CREATININE: 2 mg/dL — AB (ref 0.7–1.3)
Calcium: 9.6 mg/dL (ref 8.4–10.4)
EGFR: 33 mL/min/{1.73_m2} — ABNORMAL LOW (ref >90)
Glucose: 149 mg/dl — ABNORMAL HIGH (ref 70–140)
POTASSIUM: 3.8 meq/L (ref 3.5–5.1)
Sodium: 141 mEq/L (ref 136–145)

## 2014-12-03 LAB — IRON AND TIBC CHCC
%SAT: 27 % (ref 20–55)
IRON: 107 ug/dL (ref 42–163)
TIBC: 401 ug/dL (ref 202–409)
UIBC: 294 ug/dL (ref 117–376)

## 2014-12-03 LAB — FERRITIN CHCC: Ferritin: 76 ng/ml (ref 22–316)

## 2014-12-03 NOTE — Telephone Encounter (Signed)
**Note De-Identified Lotus Santillo Obfuscation** The pts wife called to report the pts daily weight. Noted.

## 2014-12-03 NOTE — Addendum Note (Signed)
Addended by: Neita Garnet A on: 12/03/2014 04:34 PM   Modules accepted: Orders

## 2014-12-03 NOTE — Progress Notes (Signed)
[name] presents today for phlebotomy per MD orders. Phlebotomy procedure started at 1440 and ended at 1448. 500 ml removed. Patient observed for 30 minutes after procedure without any incident. Patient tolerated procedure well. IV needle removed intact.

## 2014-12-03 NOTE — Patient Instructions (Signed)

## 2014-12-03 NOTE — Telephone Encounter (Signed)
New Message       Pt's wife calling to speak to Jeani Hawking, will not elaborate. Please call back and advise.
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
## 2014-12-04 DIAGNOSIS — M549 Dorsalgia, unspecified: Secondary | ICD-10-CM | POA: Diagnosis not present

## 2014-12-05 ENCOUNTER — Ambulatory Visit (INDEPENDENT_AMBULATORY_CARE_PROVIDER_SITE_OTHER): Payer: Medicare Other | Admitting: Endocrinology

## 2014-12-05 ENCOUNTER — Encounter: Payer: Self-pay | Admitting: Endocrinology

## 2014-12-05 VITALS — BP 110/72 | HR 75 | Temp 98.3°F | Resp 16 | Ht 72.0 in | Wt 255.4 lb

## 2014-12-05 DIAGNOSIS — E1165 Type 2 diabetes mellitus with hyperglycemia: Secondary | ICD-10-CM | POA: Diagnosis not present

## 2014-12-05 DIAGNOSIS — E1142 Type 2 diabetes mellitus with diabetic polyneuropathy: Secondary | ICD-10-CM

## 2014-12-05 DIAGNOSIS — G629 Polyneuropathy, unspecified: Secondary | ICD-10-CM

## 2014-12-05 DIAGNOSIS — IMO0002 Reserved for concepts with insufficient information to code with codable children: Secondary | ICD-10-CM

## 2014-12-05 NOTE — Patient Instructions (Addendum)
Take at least 12 units of Humalog at suppertime when eating some starchy foods and up to 14 units when eating out, continue 10 units if you are eating a salad  If the blood sugars in the mornings continue to be over 140 by next Monday increase the Levemir to 24 units  When switching LEVEMIR to Novolin N insulin, you will take 14 units about an hour before breakfast and 8 units at bedtime  Humalog will be switched to regular insulin, Novolin R from [employer].  Use the same doses as Humalog except take the insulin 15-30 minutes before eating  You can alternate checking blood sugars about 2 hours after lunch and supper

## 2014-12-05 NOTE — Progress Notes (Signed)
Patient ID: [name], male   DOB: [birth year], 74 y.o.   MRN: [id number]    Reason for Appointment : F/u for Type 2 Diabetes  History of Present Illness          Diagnosis: Type 2 diabetes mellitus, date of diagnosis: 2008       Past history: He was initially treated with Metformin and also given Amayl at some point. With this he had fair control of his diabetes with A1c had usually been over 7% except once in 2013.  He was taken off metformin probably in March this year after a hospitalization, possibly because of renal dysfunction He was started on Levemir insulin in May when his blood sugars were significantly higher, fasting readings averaging 220.  Levemir progressively increased and was on 35 units at night since 12/20/12 He has not been on any other medications either orally or injectable for his diabetes Because of his A1c of over 10% in 10/14 on basal insulin alone he was started on NovoLog with each meal in 10/14  Recent history:     INSULIN regimen is described as:  22 Levemir hs Humalog 8 lunch--10 units ac supper  His blood sugars have been more stable and over the last few months He has been taking about the same amount of insulin for some time However his renal function has been overall worse and this may be allowing lower doses of insulin   A1c is stable at 6.7 %    Current blood sugar patterns and problems identified:   His fasting blood sugars are fairly consistent now and only recently somewhat higher  He has sporadic high readings after her evening meal especially recently which he thinks is from eating out frequently at the beach  Has only occasional high readings after lunch  Still not eating more than 2 meals a day usually   no hypoglycemia at any time   Again he has difficulty losing weight and is not able to do much exercise  His wife is usually helping him watch his diet  Glucose monitoring:  done  Up to 2  times a day         Glucometer:  Accu-Chek    Blood Glucose readings from his download recently show the following readings and patterns  Mean values apply above for all meters except median for One Touch  PRE-MEAL Fasting Lunch  PCL   PCS  Overall  Glucose range:  107-159    126-194   107-253    Mean/median:  132    143   160   143    Hypoglycemia frequency:   None  Self-care: The diet that the patient has been following is: Smaller portions and relatively balanced meals  Meals: 2 meals per day. Noon and 6 pm  Low fat, trying to eat more salads. Avoiding drinks with sugar and no juices       Physical activity: exercise: Minimal, limited by back pain           Dietician visit: Most recent: Unknown.  CDE visit: 11/14               Compliance with the medical regimen: Good  Retinal exam: Most recent: 6/14, reportedly normal  Weight control: This appears to be fluctuating significantly  Wt Readings from Last 3 Encounters:  12/05/14 255 lb 6.4 oz (115.849 kg)  12/03/14 252 lb (114.306 kg)  10/17/14 249 lb (112.946 kg)    Lab  Results  Component Value Date   HGBA1C 6.7* 12/02/2014   HGBA1C 6.6* 09/02/2014   HGBA1C 6.4* 05/05/2014   Lab Results  Component Value Date   MICROALBUR 1.6 09/02/2014   LDLCALC 90 09/02/2014   CREATININE 2.0* 12/03/2014       Medication List       This list is accurate as of: 12/05/14 11:59 PM.  Always use your most recent med list.               ACCU-CHEK FASTCLIX LANCETS Misc  USE ONE LANCET TO CHECK BLOOD SUGAR THREE TIMES DAILY     ACCU-CHEK SMARTVIEW test strip  Generic drug:  glucose blood     acetaminophen 325 MG tablet  Commonly known as:  TYLENOL  Take 975 mg by mouth every morning.     allopurinol 100 MG tablet  Commonly known as:  ZYLOPRIM  TAKE ONE TABLET BY MOUTH ONCE DAILY     amLODipine 5 MG tablet  Commonly known as:  NORVASC  Take 2.5 mg by mouth daily.     aspirin 81 MG tablet  Take 81 mg by mouth once a week.     atorvastatin 10  MG tablet  Commonly known as:  LIPITOR  TAKE ONE TABLET BY MOUTH ONCE DAILY     carboxymethylcellulose 0.5 % Soln  Commonly known as:  REFRESH PLUS  Place 2 drops into both eyes daily.     carvedilol 6.25 MG tablet  Commonly known as:  COREG  Take 1 tablet (6.25 mg total) by mouth 2 (two) times daily.     chlorthalidone 25 MG tablet  Commonly known as:  HYGROTON  Take when weight reaches 244 lbs. or greater only     Cholecalciferol 10000 UNITS Caps  Take 1,000 Units by mouth daily.     colchicine 0.6 MG tablet  TAKE ONE TABLET BY MOUTH AS NEEDED FOR  GOUT     furosemide 80 MG tablet  Commonly known as:  LASIX  Take 1 tablet (80 mg total) by mouth 2 (two) times daily.     gabapentin 300 MG capsule  Commonly known as:  NEURONTIN  3 x daily as needed for leg pain     insulin detemir 100 unit/ml Soln  Commonly known as:  LEVEMIR  Inject 22 Units into the skin at bedtime.     insulin lispro 100 UNIT/ML KiwkPen  Commonly known as:  HUMALOG KWIKPEN  Inject 0.08-0.12 mLs (8-12 Units total) into the skin 2 (two) times daily.     ipratropium 0.06 % nasal spray  Commonly known as:  ATROVENT  Place 1 spray into both nostrils as directed.     losartan 50 MG tablet  Commonly known as:  COZAAR  Take 1 tablet (50 mg total) by mouth daily.     metFORMIN 750 MG 24 hr tablet  Commonly known as:  GLUCOPHAGE-XR  TAKE ONE TABLET BY MOUTH ONCE DAILY WITH BREAKFAST     naphazoline-pheniramine 0.025-0.3 % ophthalmic solution  Commonly known as:  NAPHCON-A  Place 1 drop into both eyes daily as needed for irritation.     nitroGLYCERIN 0.4 MG SL tablet  Commonly known as:  NITROSTAT  Place 0.4 mg under the tongue every 5 (five) minutes as needed for chest pain (MAX 3 TABLETS).     Potassium Chloride ER 20 MEQ Tbcr  Take 30 mcg by mouth 2 (two) times daily.     warfarin 2.5 MG tablet  Commonly known  as:  COUMADIN  Take 2.5 mg daily except Wednesday, take 5 mg.        Allergies:   Allergies  Allergen Reactions  . Avelox [Moxifloxacin Hcl In Nacl] Swelling, Rash and Other (See Comments)    Patient became hypotensive after infusion started Because of a history of documented adverse serious drug reaction;Medi Alert bracelet  is recommended  . Penicillins Anaphylaxis    REACTION: anaphylaxis Because of a history of documented adverse serious drug reaction;Medi Alert bracelet  is recommended    Past Medical History  Diagnosis Date  . Diabetes mellitus   . Hyperlipidemia   . Hypertension   . Pilonidal cyst   . Atrial fibrillation     Previous long-term amiodarone therapy with multiple cardioversions / amiodarone stopped September, 2009  . Atrial flutter     Started November, 2010, Left-sided and cannot ablate  . Left atrial thrombus     Remote past... cardioversions done since that time  . Wide-complex tachycardia   . Left ventricular ejection fraction less than 40%   . Gout   . AAA (abdominal aortic aneurysm)     Surgical repair  . Discolored skin   . S/P ICD (internal cardiac defibrillator) procedure     Dr. Lovena Le 2009... by the pacing  . SOB (shortness of breath)     Large left effusion/ thoracentesis/hospitalization/November, 2011... exudated.. cytology negative.. Dr.Wert.. no proof of mesothelioma  . Pericardial effusion   . Pleural effusion     Large loculated effusion on the left side November, 2011. This was tapped. It was exudative. Cytology revealed no cancer no proof of mesothelioma area pulmonary team felt that no further workup was needed  . S/P AAA repair   . Spinal stenosis     Surgery Dr.Elsner  . CAD (coronary artery disease)     Catheterization July, 2008... name and vein grafts patent but low cardiac output  . Warfarin anticoagulation   . Cardiomyopathy     Ischemic... ICD  . Combined systolic and diastolic CHF   . Venous insufficiency     Toe discoloration chronic  . Mitral regurgitation     Mild echo  . Aortic valve sclerosis     . Nasal drainage     Chronic  . Alcohol ingestion of more than four drinks per week     Excess beer  not a dependency problem  . Chronotropic incompetence     IV pacing rate adjusted  . Thrombophlebitis of superficial veins of upper extremities     Possible venous stenosis from defibrillator  . Pericardial effusion     November, 2011 .. decreased during hospitalization  . Eye abnormality     Ophthalmologist questions a clot in one of his eyes, May, 2012  . Overweight(278.02)     November, 2012  . Pleural thickening   . Ejection fraction     Ejection fraction has varied over time from 35-50%.,, Echoes are technically very difficult,,, EF 50%, echo, May 25, 2011, technically very difficult  . Drug therapy     Redness and swelling with Avelox infusion May 24, 2011  . COPD (chronic obstructive pulmonary disease)   . Myocardial infarction   . Spinal cord stimulator status     October, 2013  . Carotid artery disease     Doppler, December, 2013, 0-39% bilateral  . Pneumonia   . Arthritis   . Ventral hernia     April, 2014, result of his abdominal surgery  . Bony abnormality  Patient's manubrium is slightly displaced to the right  . Ascending aortic aneurysm 05/10/2014  . 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 Internal hemorrhoids   . Diverticulosis   . Adenomatous colon polyp     Past Surgical History  Procedure Laterality Date  . Colonoscopy w/ polypectomy    . Lumbar fusion    . Pilonidal cyst removal    . Surgery scrotal / testicular    . Coronary artery bypass graft  2004  . Abdominal aortic aneurysm repair  11/2002  . Bi-ventricular pacemaker insertion (crt-p)  02-11-2013    Pt with previously implanted MDT CRTD downgraded to CRTP by Dr Lovena Le 02-11-13  . Back surgery    . Incision and drainage abscess / hematoma of bursa / knee / thigh    . Implantable cardioverter defibrillator (icd) generator change N/A 02/11/2013    Procedure: ICD GENERATOR CHANGE;  Surgeon: Evans Lance, MD;  [facility] C[facility] LAB;  Service: Cardiovascular;  Laterality: N/A;  . Leg surgery Right     Family History  Problem Relation Age of Onset  . Hypertension Mother   . Stroke Mother   . Diabetes Father   . Coronary artery disease Father   . Other Father     DVT  . Colon cancer Neg Hx   . Diabetes Brother     Social History:  reports that he quit smoking about 19 years ago. His smoking use included Cigarettes. He started smoking about 58 years ago. He has a 117 pack-year smoking history. He has never used smokeless tobacco. He reports that he drinks about 1.2 oz of alcohol per week. He reports that he does not use illicit drugs.    Review of Systems       Lipids: Treated with Lipitor, only taking 10 mg Hyperlipidemia: LDL is under 100 but not under 70  And he is being managed by PCP/cardiologist   Lab Results  Component Value Date   CHOL 152 09/02/2014   HDL 40.00 09/02/2014   LDLCALC 90 09/02/2014   TRIG 112.0 09/02/2014   CHOLHDL 4 09/02/2014       The blood pressure has been under control with current regimen of chlorthalidone, amlodipine and losartan, BP at home usually normal Also he takes extra chlorthalidone when he has weight gain  Renal dysfunction: Creatinine has been quite variable , probably related to his volume status  Lab Results  Component Value Date   CREATININE 2.0* 12/03/2014   BUN 49.5* 12/03/2014   NA 141 12/03/2014   K 3.8 12/03/2014   CL 101 11/05/2014   CO2 29 12/03/2014      Has known neuropathy: He has had pains and tingling in his feet and lower legs.  Mostly having some symptoms in day;  some early morning and during the night He is doing better with taking an extra gabapentin at bedtime  Requires phlebotomy for polycythemia periodically   LABS:  Office Visit on 12/03/2014  Component Date Value Ref Range Status  . Sodium 12/03/2014 141  136 - 145 mEq/L Final  . Potassium 12/03/2014 3.8  3.5 - 5.1 mEq/L Final  . Chloride  12/03/2014 98  98 - 109 mEq/L Final  . CO2 12/03/2014 29  22 - 29 mEq/L Final  . Glucose 12/03/2014 149* 70 - 140 mg/dl Final   Glucose reference range is for nonfasting patients. Fasting glucose reference range is 70- 100.  Marland Kitchen BUN 12/03/2014 49.5* 7.0 - 26.0 mg/dL Final  . Creatinine 12/03/2014  2.0* 0.7 - 1.3 mg/dL Final  . Calcium 12/03/2014 9.6  8.4 - 10.4 mg/dL Final  . Anion Gap 12/03/2014 13* 3 - 11 mEq/L Final  . EGFR 12/03/2014 33* >90 ml/min/1.73 m2 Final   eGFR is calculated using the CKD-EPI Creatinine Equation (2009)  Appointment on 12/03/2014  Component Date Value Ref Range Status  . WBC 12/03/2014 8.2  4.0 - 10.0 10e3/uL Final  . RBC 12/03/2014 5.40  4.20 - 5.70 10e6/uL Final  . HGB 12/03/2014 16.7  13.0 - 17.1 g/dL Final  . HCT 12/03/2014 49.0  38.7 - 49.9 % Final  . MCV 12/03/2014 91  82 - 98 fL Final  . MCH 12/03/2014 30.9  28.0 - 33.4 pg Final  . MCHC 12/03/2014 34.1  32.0 - 35.9 g/dL Final  . RDW 12/03/2014 14.7  11.1 - 15.7 % Final  . Platelets 12/03/2014 166  145 - 400 10e3/uL Final  . NEUT# 12/03/2014 5.9  1.5 - 6.5 10e3/uL Final  . LYMPH# 12/03/2014 1.5  0.9 - 3.3 10e3/uL Final  . MONO# 12/03/2014 0.8  0.1 - 0.9 10e3/uL Final  . Eosinophils Absolute 12/03/2014 0.1  0.0 - 0.5 10e3/uL Final  . BASO# 12/03/2014 0.0  0.0 - 0.2 10e3/uL Final  . NEUT% 12/03/2014 71.5  40.0 - 80.0 % Final  . LYMPH% 12/03/2014 18.0  14.0 - 48.0 % Final  . MONO% 12/03/2014 9.5  0.0 - 13.0 % Final  . EOS% 12/03/2014 0.6  0.0 - 7.0 % Final  . BASO% 12/03/2014 0.4  0.0 - 2.0 % Final  . Iron 12/03/2014 107  42 - 163 ug/dL Final  . TIBC 12/03/2014 401  202 - 409 ug/dL Final  . UIBC 12/03/2014 294  117 - 376 ug/dL Final  . %SAT 12/03/2014 27  20 - 55 % Final  . Ferritin 12/03/2014 76  22 - 316 ng/ml Final  Appointment on 12/02/2014  Component Date Value Ref Range Status  . Hgb A1c MFr Bld 12/02/2014 6.7* 4.6 - 6.5 % Final   Glycemic Control Guidelines for People with Diabetes:Non  Diabetic:  <6%Goal of Therapy: <7%Additional Action Suggested:  >8%   . Glucose, Bld 12/02/2014 148* 70 - 99 mg/dL Final    Physical Examination:  BP 110/72 mmHg  Pulse 75  Temp(Src) 98.3 F (36.8 C)  Resp 16  Ht 6' (1.829 m)  Wt 255 lb 6.4 oz (115.849 kg)  BMI 34.63 kg/m2  SpO2 97%  No ankle edema    ASSESSMENT/PLAN:  Diabetes type 2  His blood sugar control is  fairly stable with A1c 6.7 See history of present illness for detailed discussion of his current management, blood sugar patterns and problems identified  he is on basal bolus insulin regimen and is very compliant with his instructions on glucose monitoring and insulin doses as discussed  He appears to be requiring fairly stable doses of insulin.  Previously had required relatively larger doses of basal insulin   Since he wants to try a generic medication  when he runs out of his current supply he is still concerned about how to dose the new insulin regimen Discussed that eating can use the [employer] brand with the syringe for the NPH and  this will be given a couple of hours before his lunch and at bedtime since he does not eat breakfast Cautioned him against a  possibility of hypoglycemia overnight with this  Doses guidelines given as below  Explained to him and his wife  that  regular insulin will need to be taken 20-30 minutes before eating and may last longer, probably can continue the same doses as of the Humalog Because of the cost of the V-go pump in the donut hole he still cannot do this and he does not feel interested in trying anyway    He will call if he has any issues with trying the new insulin regimen  NEUROPATHY: He is having some relief of her symptoms recently with gabapentin increased doses  CKD: Creatinine is 2.0 and he will follow-up with his nephrologist and cardiologist about this  Patient Instructions  Take at least 12 units of Humalog at suppertime when eating some starchy foods and up to 14  units when eating out, continue 10 units if you are eating a salad  If the blood sugars in the mornings continue to be over 140 by next Monday increase the Levemir to 24 units  When switching LEVEMIR to Novolin N insulin, you will take 14 units about an hour before breakfast and 8 units at bedtime  Humalog will be switched to regular insulin, Novolin R from [employer].  Use the same doses as Humalog except take the insulin 15-30 minutes before eating  You can alternate checking blood sugars about 2 hours after lunch and supper     Counseling time on subjects discussed above is over 50% of today's 25 minute visit  KUMAR,AJAY 12/07/2014, 8:43 PM

## 2014-12-07 ENCOUNTER — Telehealth: Payer: Self-pay | Admitting: Endocrinology

## 2014-12-07 NOTE — Telephone Encounter (Signed)
Please ask him if he is taking metformin.  He is to stop this as his kidney function is worse

## 2014-12-08 ENCOUNTER — Ambulatory Visit: Payer: Medicare Other | Admitting: Endocrinology

## 2014-12-08 NOTE — Telephone Encounter (Signed)
Left pt a Vm to return call for medication update from Dr.Kumar

## 2014-12-11 ENCOUNTER — Other Ambulatory Visit: Payer: Self-pay | Admitting: *Deleted

## 2014-12-11 MED ORDER — GLUCOSE BLOOD VI STRP: ORAL_STRIP | Status: DC

## 2014-12-11 MED ORDER — GABAPENTIN 300 MG PO CAPS: ORAL_CAPSULE | ORAL | Status: DC

## 2014-12-11 NOTE — Telephone Encounter (Signed)
Patient need prescription for test strips for Accu Check [employer] connect meter

## 2014-12-11 NOTE — Telephone Encounter (Signed)
rx sent

## 2014-12-12 ENCOUNTER — Other Ambulatory Visit (HOSPITAL_COMMUNITY): Payer: Self-pay | Admitting: Neurological Surgery

## 2014-12-12 ENCOUNTER — Ambulatory Visit (INDEPENDENT_AMBULATORY_CARE_PROVIDER_SITE_OTHER): Payer: Medicare Other

## 2014-12-12 ENCOUNTER — Telehealth: Payer: Self-pay | Admitting: Cardiology

## 2014-12-12 ENCOUNTER — Other Ambulatory Visit: Payer: Self-pay | Admitting: Neurological Surgery

## 2014-12-12 DIAGNOSIS — Z23 Encounter for immunization: Secondary | ICD-10-CM | POA: Diagnosis not present

## 2014-12-12 DIAGNOSIS — M549 Dorsalgia, unspecified: Secondary | ICD-10-CM

## 2014-12-12 DIAGNOSIS — M5412 Radiculopathy, cervical region: Secondary | ICD-10-CM

## 2014-12-12 DIAGNOSIS — M48062 Spinal stenosis, lumbar region with neurogenic claudication: Secondary | ICD-10-CM

## 2014-12-12 NOTE — Telephone Encounter (Signed)
New Message  Pt wife called to speak w/ Jeani Hawking. Please call back and discuss.

## 2014-12-12 NOTE — Telephone Encounter (Signed)
**Note De-Identified Alois Colgan Obfuscation** The pts wife, Joaquim Lai, called to give the pts daily weight. Noted.

## 2014-12-15 ENCOUNTER — Ambulatory Visit: Payer: Medicare Other | Admitting: Cardiology

## 2014-12-15 ENCOUNTER — Ambulatory Visit (INDEPENDENT_AMBULATORY_CARE_PROVIDER_SITE_OTHER): Payer: Medicare Other | Admitting: *Deleted

## 2014-12-15 ENCOUNTER — Ambulatory Visit (INDEPENDENT_AMBULATORY_CARE_PROVIDER_SITE_OTHER): Payer: Medicare Other | Admitting: Cardiology

## 2014-12-15 ENCOUNTER — Encounter: Payer: Self-pay | Admitting: Cardiology

## 2014-12-15 VITALS — BP 114/70 | HR 66 | Ht 72.0 in | Wt 252.0 lb

## 2014-12-15 DIAGNOSIS — Z95810 Presence of automatic (implantable) cardiac defibrillator: Secondary | ICD-10-CM

## 2014-12-15 DIAGNOSIS — I714 Abdominal aortic aneurysm, without rupture, unspecified: Secondary | ICD-10-CM

## 2014-12-15 DIAGNOSIS — R058 Other specified cough: Secondary | ICD-10-CM

## 2014-12-15 DIAGNOSIS — D45 Polycythemia vera: Secondary | ICD-10-CM

## 2014-12-15 DIAGNOSIS — Z77090 Contact with and (suspected) exposure to asbestos: Secondary | ICD-10-CM

## 2014-12-15 DIAGNOSIS — I7121 Aneurysm of the ascending aorta, without rupture: Secondary | ICD-10-CM

## 2014-12-15 DIAGNOSIS — I4891 Unspecified atrial fibrillation: Secondary | ICD-10-CM | POA: Diagnosis not present

## 2014-12-15 DIAGNOSIS — R05 Cough: Secondary | ICD-10-CM

## 2014-12-15 DIAGNOSIS — R9430 Abnormal result of cardiovascular function study, unspecified: Secondary | ICD-10-CM

## 2014-12-15 DIAGNOSIS — I5022 Chronic systolic (congestive) heart failure: Secondary | ICD-10-CM | POA: Diagnosis not present

## 2014-12-15 DIAGNOSIS — Z789 Other specified health status: Secondary | ICD-10-CM

## 2014-12-15 DIAGNOSIS — I255 Ischemic cardiomyopathy: Secondary | ICD-10-CM

## 2014-12-15 DIAGNOSIS — R0989 Other specified symptoms and signs involving the circulatory and respiratory systems: Secondary | ICD-10-CM

## 2014-12-15 DIAGNOSIS — I4589 Other specified conduction disorders: Secondary | ICD-10-CM

## 2014-12-15 DIAGNOSIS — T464X5A Adverse effect of angiotensin-converting-enzyme inhibitors, initial encounter: Secondary | ICD-10-CM

## 2014-12-15 DIAGNOSIS — I484 Atypical atrial flutter: Secondary | ICD-10-CM

## 2014-12-15 DIAGNOSIS — Z7901 Long term (current) use of anticoagulants: Secondary | ICD-10-CM | POA: Diagnosis not present

## 2014-12-15 DIAGNOSIS — I712 Thoracic aortic aneurysm, without rupture: Secondary | ICD-10-CM

## 2014-12-15 DIAGNOSIS — J90 Pleural effusion, not elsewhere classified: Secondary | ICD-10-CM

## 2014-12-15 DIAGNOSIS — Z7289 Other problems related to lifestyle: Secondary | ICD-10-CM

## 2014-12-15 DIAGNOSIS — I4819 Other persistent atrial fibrillation: Secondary | ICD-10-CM

## 2014-12-15 DIAGNOSIS — I25810 Atherosclerosis of coronary artery bypass graft(s) without angina pectoris: Secondary | ICD-10-CM

## 2014-12-15 DIAGNOSIS — I481 Persistent atrial fibrillation: Secondary | ICD-10-CM

## 2014-12-15 DIAGNOSIS — N183 Chronic kidney disease, stage 3 unspecified: Secondary | ICD-10-CM

## 2014-12-15 LAB — POCT INR: INR: 2.6

## 2014-12-15 MED ORDER — NITROGLYCERIN 0.4 MG SL SUBL: 0.4000 mg | SUBLINGUAL_TABLET | SUBLINGUAL | Status: AC | PRN

## 2014-12-15 MED ORDER — CHLORTHALIDONE 25 MG PO TABS: ORAL_TABLET | ORAL | Status: DC

## 2014-12-15 NOTE — Assessment & Plan Note (Signed)
Vision has significant ischemic cardiomyopathy. ICD is in place. He is on the medications that have worked for him over time. Of course consideration in the future could always be given to further adjustment with new medication guidelines.

## 2014-12-15 NOTE — Assessment & Plan Note (Signed)
The patient has had significant problems with spinal pain over the years. He is followed very carefully by Dr. Ellene Route of neurosurgery. He has had a spinal cord stimulator in the past. He is currently being evaluated by Dr. Ellene Route.

## 2014-12-15 NOTE — Patient Instructions (Signed)
Medication Instructions:  Same-no changes  Labwork: None  Testing/Procedures: None  Follow-Up: Your physician recommends that you schedule a follow-up appointment in: 3 to 4 months with Dr Irish Lack

## 2014-12-15 NOTE — Assessment & Plan Note (Signed)
There is history of a cough from ACE inhibitor. He was switched to an ARB.

## 2014-12-15 NOTE — Assessment & Plan Note (Signed)
He does have diabetes which is followed carefully by his primary team.

## 2014-12-15 NOTE — Assessment & Plan Note (Signed)
His most recent ejection fraction was 35-40% by echo in December, 2015. The studies are technically difficult. The ascending aorta was 46 mm.

## 2014-12-15 NOTE — Assessment & Plan Note (Signed)
In the past the patient was on amiodarone for atrial fibrillation. He had multiple cardioversions. Over time it was felt that amiodarone was no longer effective in maintaining sinus rhythm. Amiodarone was stopped in 2009.

## 2014-12-15 NOTE — Assessment & Plan Note (Signed)
Patient has significant coronary disease. He underwent bypass surgery in the past. Catheterization was done last in 2008. His grafts were patent. He did have low cardiac output at that time.

## 2014-12-15 NOTE — Assessment & Plan Note (Signed)
Historically the patient has always had beer to drink on a regular basis. This has never been a dependency problem. However with left ventricular dysfunction, and problems with volume overload, I've been able to convince him to stop his beer consumption almost completely over the years.

## 2014-12-15 NOTE — Assessment & Plan Note (Addendum)
**Note De-Identified [name] Obfuscation** With limiting salt and fluid intake carefully, including limiting beer intake, the patient's volume status has been controlled. He weighs himself daily. He and his wife know how to take extra diuretics as needed for fluctuation in his weight. [name] is very familiar with the program to control his volume. She will continue to communicate with him at home over time. At this time his volume is very nicely controlled. No change in therapy.

## 2014-12-15 NOTE — Assessment & Plan Note (Signed)
His ICD is in place. It is followed carefully by Dr. Lovena Le.

## 2014-12-15 NOTE — Progress Notes (Signed)
Cardiology Office Note   Date:  12/15/2014   ID:  [name], DOB 10-24-1940, MRN [id number]  PCP:  [name], MD  Cardiologist:  Dola Argyle, MD   Chief Complaint  Patient presents with  . Appointment    Follow-up coronary disease and cardiomyopathy      History of Present Illness: [name] is a 74 y.o. male who presents today for cardiology follow-up of his coronary disease and cardiomyopathy. He is doing very well. Unfortunately he is having pain from his spine disease. He is following carefully with Dr. Ellene Route of neurosurgery. Patient weighs himself daily. He adjust his meds by adding chlorthalidone if his weight goes above a certain level. This is well outlined in the flow sheet that Jeani Hawking Via keeps. She personally communicates with him and his wife intermittently to be sure that his volume status is stable. The patient and his wife are very careful with her healthcare and communicate regularly with Jeani Hawking. In deciding who should follow him from the cardiology viewpoint, I felt it was very important to keep the continuity with Jeani Hawking. Therefore I'm arranging for Dr. Irish Lack to be his ongoing cardiologist. I have reviewed the records very carefully. The problem list is completely up-to-date in detail. This note will also carefully review the majority of his problems in the assessment below.  Fortunately his cardiac status is stable. He is not having any chest pain or shortness of breath. His volume status is stable.    Past Medical History  Diagnosis Date  . Diabetes mellitus   . Hyperlipidemia   . Hypertension   . Pilonidal cyst   . Atrial fibrillation     Previous long-term amiodarone therapy with multiple cardioversions / amiodarone stopped September, 2009  . Atrial flutter     Started November, 2010, Left-sided and cannot ablate  . Left atrial thrombus     Remote past... cardioversions done since that time  . Wide-complex tachycardia   . Left  ventricular ejection fraction less than 40%   . Gout   . AAA (abdominal aortic aneurysm)     Surgical repair  . Discolored skin   . S/P ICD (internal cardiac defibrillator) procedure     Dr. Lovena Le 2009... by the pacing  . SOB (shortness of breath)     Large left effusion/ thoracentesis/hospitalization/November, 2011... exudated.. cytology negative.. Dr.Wert.. no proof of mesothelioma  . Pericardial effusion   . Pleural effusion     Large loculated effusion on the left side November, 2011. This was tapped. It was exudative. Cytology revealed no cancer no proof of mesothelioma area pulmonary team felt that no further workup was needed  . S/P AAA repair   . Spinal stenosis     Surgery Dr.Elsner  . CAD (coronary artery disease)     Catheterization July, 2008... name and vein grafts patent but low cardiac output  . Warfarin anticoagulation   . Cardiomyopathy     Ischemic... ICD  . Combined systolic and diastolic CHF   . Venous insufficiency     Toe discoloration chronic  . Mitral regurgitation     Mild echo  . Aortic valve sclerosis   . Nasal drainage     Chronic  . Alcohol ingestion of more than four drinks per week     Excess beer  not a dependency problem  . Chronotropic incompetence     IV pacing rate adjusted  . Thrombophlebitis of superficial veins of upper extremities     Possible  venous stenosis from defibrillator  . Pericardial effusion     November, 2011 .. decreased during hospitalization  . Eye abnormality     Ophthalmologist questions a clot in one of his eyes, May, 2012  . Overweight(278.02)     November, 2012  . Pleural thickening   . Ejection fraction     Ejection fraction has varied over time from 35-50%.,, Echoes are technically very difficult,,, EF 50%, echo, May 25, 2011, technically very difficult  . Drug therapy     Redness and swelling with Avelox infusion May 24, 2011  . COPD (chronic obstructive pulmonary disease)   . Myocardial infarction   .  Spinal cord stimulator status     October, 2013  . Carotid artery disease     Doppler, December, 2013, 0-39% bilateral  . Pneumonia   . Arthritis   . Ventral hernia     April, 2014, result of his abdominal surgery  . Bony abnormality     Patient's manubrium is slightly displaced to the right  . Ascending aortic aneurysm 05/10/2014  . 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 Internal hemorrhoids   . Diverticulosis   . Adenomatous colon polyp     Past Surgical History  Procedure Laterality Date  . Colonoscopy w/ polypectomy    . Lumbar fusion    . Pilonidal cyst removal    . Surgery scrotal / testicular    . Coronary artery bypass graft  2004  . Abdominal aortic aneurysm repair  11/2002  . Bi-ventricular pacemaker insertion (crt-p)  02-11-2013    Pt with previously implanted MDT CRTD downgraded to CRTP by Dr Lovena Le 02-11-13  . Back surgery    . Incision and drainage abscess / hematoma of bursa / knee / thigh    . Implantable cardioverter defibrillator (icd) generator change N/A 02/11/2013    Procedure: ICD GENERATOR CHANGE;  Surgeon: Evans Lance, MD;  Location: [facility] CATH LAB;  Service: Cardiovascular;  Laterality: N/A;  . Leg surgery Right     Patient Active Problem List   Diagnosis Date Noted  . 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 Ascending aortic aneurysm 05/10/2014  . Hypoxia 05/05/2014  . COPD exacerbation 05/05/2014  . Diabetes 05/05/2014  . Chronic systolic CHF (congestive heart failure) 03/07/2014  . History of DVT (deep vein thrombosis) 03/07/2014  . Cardiomyopathy, ischemic 02/05/2014  . Pleural plaque without asbestosis 01/23/2014  . Elevated hemoglobin 01/14/2014  . CKD (chronic kidney disease) stage 3, GFR 30-59 ml/min 01/14/2014  . Asbestos exposure 01/14/2014  . Shortness of breath 01/01/2014  . Abscess, scrotum 03/05/2013  . Ejection fraction   . Hypopotassemia 12/20/2012  . ACE-inhibitor cough 10/04/2012  . Ventral hernia   . Bony abnormality   . Carotid artery  disease   . Spinal cord stimulator status   . 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 Atrial fibrillation   . Spinal stenosis   . CAD (coronary artery disease)   . Venous insufficiency   . Mitral regurgitation   . Aortic valve sclerosis   . Alcohol ingestion of more than four drinks per week   . Chronotropic incompetence   . Thrombophlebitis of superficial veins of upper extremities   . Pericardial effusion   . Overweight(278.02)   . Pleural effusion   . Hx of allergic drug reaction   . Hyperlipidemia   . Hypertension   . Atrial flutter   . Left atrial thrombus   . AAA (abdominal aortic aneurysm)   . S/P ICD (  internal cardiac defibrillator) procedure   . Warfarin anticoagulation   . Diabetic polyneuropathy 01/07/2010  . DIVERTICULOSIS, COLON 06/10/2009  . Uncontrolled diabetes mellitus with peripheral circulatory disorder 03/11/2009  . BACK PAIN, LUMBAR 06/12/2008  . 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 Acute gout 07/27/2006  . CORONARY ARTERY BYPASS GRAFT, HX OF 07/27/2006      Current Outpatient Prescriptions  Medication Sig Dispense Refill  . ACCU-CHEK FASTCLIX LANCETS MISC USE ONE LANCET TO CHECK BLOOD SUGAR THREE TIMES DAILY 102 each 5  . acetaminophen (TYLENOL) 325 MG tablet Take 975 mg by mouth every morning.    Marland Kitchen allopurinol (ZYLOPRIM) 100 MG tablet TAKE ONE TABLET BY MOUTH ONCE DAILY 90 tablet 1  . amLODipine (NORVASC) 5 MG tablet Take 2.5 mg by mouth daily.    Marland Kitchen aspirin 81 MG tablet Take 81 mg by mouth once a week.     Marland Kitchen atorvastatin (LIPITOR) 10 MG tablet TAKE ONE TABLET BY MOUTH ONCE DAILY 90 tablet 0  . carboxymethylcellulose (REFRESH PLUS) 0.5 % SOLN Place 2 drops into both eyes daily.     . carvedilol (COREG) 6.25 MG tablet Take 1 tablet (6.25 mg total) by mouth 2 (two) times daily. 180 tablet 1  . chlorthalidone (HYGROTON) 25 MG tablet Take when weight reaches 244 lbs. or greater only 90 tablet 3  . Cholecalciferol  10000 UNITS CAPS Take 1,000 Units by mouth daily.    . colchicine 0.6 MG tablet TAKE ONE TABLET BY MOUTH AS NEEDED FOR  GOUT 90 tablet 0  . furosemide (LASIX) 80 MG tablet Take 1 tablet (80 mg total) by mouth 2 (two) times daily. 180 tablet 3  . gabapentin (NEURONTIN) 300 MG capsule Take 1 tablet 4 times daily as needed for leg pain 360 capsule 3  . glucose blood (ACCU-CHEK AVIVA PLUS) test strip Use as instructed to check blood sugar 3 times per day dx code E11.49 100 each 5  . insulin detemir (LEVEMIR) 100 unit/ml SOLN Inject 22 Units into the skin at bedtime.    . insulin lispro (HUMALOG KWIKPEN) 100 UNIT/ML KiwkPen Inject 0.08-0.12 mLs (8-12 Units total) into the skin 2 (two) times daily. 5 pen 3  . ipratropium (ATROVENT) 0.06 % nasal spray Place 1 spray into both nostrils as directed.     Marland Kitchen losartan (COZAAR) 50 MG tablet Take 1 tablet (50 mg total) by mouth daily. 90 tablet 3  . naphazoline-pheniramine (NAPHCON-A) 0.025-0.3 % ophthalmic solution Place 1 drop into both eyes daily as needed for irritation.    . nitroGLYCERIN (NITROSTAT) 0.4 MG SL tablet Place 0.4 mg under the tongue every 5 (five) minutes as needed for chest pain (MAX 3 TABLETS).     . Potassium Chloride ER 20 MEQ TBCR Take 30 mcg by mouth 2 (two) times daily. 60 tablet   . warfarin (COUMADIN) 2.5 MG tablet Take 2.5 mg daily except Wednesday, take 5 mg. 130 tablet 0   No current facility-administered medications for this visit.    Allergies:   Avelox and Penicillins    Social History:  The patient  reports that he quit smoking about 19 years ago. His smoking use included Cigarettes. He started smoking about 58 years ago. He has a 117 pack-year smoking history. He has never used smokeless tobacco. He reports that he drinks about 1.2 oz of alcohol per week. He reports that he does not use illicit drugs.   Family History:  The patient's family history includes Coronary artery disease  in his father; Diabetes in his brother and  father; Hypertension in his mother; Other in his father; Stroke in his mother. There is no history of Colon cancer.    ROS:  Please see the history of present illness.     Patient denies fever, chills, headache, sweats, rash, change in vision, change in hearing, chest pain, cough, nausea or vomiting, urinary symptoms. All other systems are reviewed and are negative.   PHYSICAL EXAM: VS:  There were no vitals taken for this visit. , Patient is oriented to person time and place. Affect is normal. He is here with his wife. Head is atraumatic. Sclera and conjunctiva are normal. There is no jugular venous distention. Lungs are clear. Respiratory effort is not labored. Cardiac exam reveals S1 and S2. The abdomen is protuberant but soft. There is no peripheral edema. He is walking with a cane. There are no skin rashes.   EKG:   EKG is not done today.   Recent Labs: 05/05/2014: B Natriuretic Peptide 607.3* 05/08/2014: Magnesium 2.7* 05/14/2014: Pro B Natriuretic peptide (BNP) 670.0* 09/03/2014: ALT 8 10/07/2014: TSH 0.59 12/03/2014: BUN 49.5*; Creatinine 2.0*; HGB 16.7; Platelets 166; Potassium 3.8; Sodium 141    Lipid Panel    Component Value Date/Time   CHOL 152 09/02/2014 1034   TRIG 112.0 09/02/2014 1034   HDL 40.00 09/02/2014 1034   CHOLHDL 4 09/02/2014 1034   VLDL 22.4 09/02/2014 1034   LDLCALC 90 09/02/2014 1034      Wt Readings from Last 3 Encounters:  12/05/14 255 lb 6.4 oz (115.849 kg)  12/03/14 252 lb (114.306 kg)  10/17/14 249 lb (112.946 kg)      Current medicines are reviewed  The patient and his wife understand his medications.     ASSESSMENT AND PLAN:

## 2014-12-15 NOTE — Assessment & Plan Note (Signed)
The patient was noted to have atrial flutter in 2010. It was felt to be left sided. Therefore ablation cannot be done. Rate has always been controlled.

## 2014-12-15 NOTE — Assessment & Plan Note (Signed)
Patient underwent surgical repair in 2004 of his abdominal aortic aneurysm. Abdominal aorta duplex in March, 2015 showed that the aortic size was normal. He has had an excellent result. No further workup.

## 2014-12-15 NOTE — Assessment & Plan Note (Signed)
The patient has been anticoagulated long-term with Coumadin. When he has procedures such as spinal injections, he does not need to be bridged. He is very reliable with his Coumadin.

## 2014-12-15 NOTE — Assessment & Plan Note (Signed)
The patient does have renal insufficiency. This has been stable over the past several years. He has not yet been referred to nephrology.

## 2014-12-15 NOTE — Assessment & Plan Note (Signed)
In November, 2011, the patient had a large loculated pleural effusion. It was tapped. It was exudative. Cytology revealed no cancer and no proof of mesothelioma. The pulmonary team felt that no further workup was needed. There has been no significant recurrence of this problem.

## 2014-12-15 NOTE — Assessment & Plan Note (Signed)
The patient does have some dilatation of his ascending aorta. It measured 46 mm by echo in December, 2015. He will need follow-up in the future.

## 2014-12-15 NOTE — Assessment & Plan Note (Signed)
The possibility of chronotropic incompetence has been very carefully addressed over time by Dr. Lovena Le. The patient's ICD pacing rate has been very carefully adjusted.

## 2014-12-15 NOTE — Assessment & Plan Note (Signed)
In the remote past the patient had significant exposure to asbestos. He has been evaluated over time and there has been no evidence of clinical asbestosis. He does have some pleural abnormalities that are felt not to be from asbestos.

## 2014-12-15 NOTE — Assessment & Plan Note (Addendum)
Over the past one to 2 years I have been concerned about his elevated hemoglobin. I had wondered if this could play a role with his shortness of breath. He is now followed by Dr. Marin Olp, who feels that the patient probably does have polycythemia vera. He does receive intermittent phlebotomy. Clinically, the patient and his wife and I all agree that this has helped him.

## 2014-12-16 ENCOUNTER — Other Ambulatory Visit: Payer: Self-pay

## 2014-12-16 MED ORDER — CHLORTHALIDONE 25 MG PO TABS: ORAL_TABLET | ORAL | Status: DC

## 2014-12-22 ENCOUNTER — Telehealth: Payer: Self-pay | Admitting: Endocrinology

## 2014-12-22 NOTE — Telephone Encounter (Signed)
Line busy on callback

## 2014-12-22 NOTE — Telephone Encounter (Signed)
Patient stated the last time she talked to you it was a bad connection she couldn't understand you, please call her

## 2014-12-24 ENCOUNTER — Ambulatory Visit (HOSPITAL_COMMUNITY)
Admission: RE | Admit: 2014-12-24 | Discharge: 2014-12-24 | Disposition: A | Discharge status: Home / Self Care | Payer: Medicare Other | Source: Ambulatory Visit | Attending: Neurological Surgery | Admitting: Neurological Surgery

## 2014-12-24 ENCOUNTER — Other Ambulatory Visit (HOSPITAL_COMMUNITY): Payer: Self-pay | Admitting: Neurological Surgery

## 2014-12-24 DIAGNOSIS — R531 Weakness: Secondary | ICD-10-CM | POA: Diagnosis not present

## 2014-12-24 DIAGNOSIS — Z981 Arthrodesis status: Secondary | ICD-10-CM | POA: Diagnosis not present

## 2014-12-24 DIAGNOSIS — M542 Cervicalgia: Secondary | ICD-10-CM | POA: Diagnosis not present

## 2014-12-24 DIAGNOSIS — M4802 Spinal stenosis, cervical region: Secondary | ICD-10-CM | POA: Insufficient documentation

## 2014-12-24 DIAGNOSIS — M549 Dorsalgia, unspecified: Secondary | ICD-10-CM

## 2014-12-24 DIAGNOSIS — M5412 Radiculopathy, cervical region: Secondary | ICD-10-CM

## 2014-12-24 DIAGNOSIS — M2578 Osteophyte, vertebrae: Secondary | ICD-10-CM | POA: Insufficient documentation

## 2014-12-24 DIAGNOSIS — M4712 Other spondylosis with myelopathy, cervical region: Secondary | ICD-10-CM | POA: Diagnosis not present

## 2014-12-24 DIAGNOSIS — M48062 Spinal stenosis, lumbar region with neurogenic claudication: Secondary | ICD-10-CM

## 2014-12-24 DIAGNOSIS — M545 Low back pain: Secondary | ICD-10-CM | POA: Insufficient documentation

## 2014-12-24 DIAGNOSIS — M47892 Other spondylosis, cervical region: Secondary | ICD-10-CM | POA: Diagnosis not present

## 2014-12-24 DIAGNOSIS — M8938 Hypertrophy of bone, other site: Secondary | ICD-10-CM | POA: Diagnosis not present

## 2014-12-24 DIAGNOSIS — M4806 Spinal stenosis, lumbar region: Secondary | ICD-10-CM | POA: Diagnosis not present

## 2014-12-24 DIAGNOSIS — M47898 Other spondylosis, sacral and sacrococcygeal region: Secondary | ICD-10-CM | POA: Insufficient documentation

## 2014-12-24 MED ORDER — IOHEXOL 300 MG/ML  SOLN
10.0000 mL | Freq: Once | INTRAMUSCULAR | Status: DC | PRN
  Administered 2014-12-24: 10 mL via INTRATHECAL
  Filled 2014-12-24: qty 10

## 2014-12-24 MED ORDER — HYDROCODONE-ACETAMINOPHEN 5-325 MG PO TABS: 1.0000 | ORAL_TABLET | ORAL | Status: DC | PRN

## 2014-12-24 MED ORDER — ONDANSETRON HCL 4 MG/2ML IJ SOLN: 4.0000 mg | Freq: Four times a day (QID) | INTRAMUSCULAR | Status: DC | PRN

## 2014-12-24 MED ORDER — DIAZEPAM 5 MG PO TABS
ORAL_TABLET | ORAL | Status: AC
  Administered 2014-12-24: 10 mg via ORAL
  Filled 2014-12-24: qty 2

## 2014-12-24 MED ORDER — DIAZEPAM 5 MG PO TABS
10.0000 mg | ORAL_TABLET | Freq: Once | ORAL | Status: AC
  Administered 2014-12-24: 10 mg via ORAL

## 2014-12-24 NOTE — Procedures (Signed)
[name] is a 74 year old individual who has had extensive surgery in the cervical spine lumbar spine in the past for spondylitic stenosis. He's had previous problems with spinal cord compression. He has had a progressive deterioration in his ability to walk and his stamina with his arms and his legs in addition to substantial neck pain and back pain. The patient has had a previous spinal cord stimulator placed is not capable of having an MRI and because of this myelography is been suggested to evaluate his spinal canal for further progression of any spondylitic stenosis.  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 10 mL Findings: Previous anterior cervical decompression with good flow of dye through the thoracic spine advanced lumbar spondylosis with decompression from L2 to the sacrum. For further evaluation with CT of the cervical spine and lumbar spine

## 2014-12-24 NOTE — Discharge Instructions (Signed)
Myelography, Care After °These instructions give you information on caring for yourself after your procedure. Your doctor may also give you more specific instructions. Call your doctor if you have any problems or questions after your procedure. °HOME CARE °· Rest the first day. °· When you rest, lie flat, with your head slightly raised (elevated). °· Avoid heavy lifting and activity for 48 hours, or as told by your doctor. °· You may take the bandage (dressing) off one day after the test, or as told by your doctor. °· Take all medicines only as told by your doctor. °· Ask your doctor when it is okay to take a shower or bath. °· Ask your doctor when your test results will be ready and how you can get them. Make sure you follow up and get your results. °· Do not drink alcohol for 24 hours, or as told by your doctor. °· Drink enough fluid to keep your pee (urine) clear or pale yellow. °GET HELP IF:  °· You have a fever. °· You have a headache. °· You feel sick to your stomach (nauseous) or throw up (vomit). °· You have pain or cramping in your belly (abdomen). °GET HELP RIGHT AWAY IF:  °· You have a headache with a stiff neck or fever. °· You have trouble breathing. °· Any of the places where the needles were put in are: °¨ Puffy (swollen) or red. °¨ Sore or hot to the touch. °¨ Draining yellowish-white fluid (pus). °¨ Bleeding. °MAKE SURE YOU: °· Understand these instructions. °· Will watch your condition. °· Will get help right away if you are not doing well or get worse. °  °This information is not intended to replace advice given to you by your health care provider. Make sure you discuss any questions you have with your health care provider. °  °Document Released: 12/15/2007 Document Revised: 03/28/2014 Document Reviewed: 11/30/2011 °Elsevier Interactive Patient Education ©2016 [employer] ° °

## 2014-12-25 ENCOUNTER — Encounter: Payer: Medicare Other | Admitting: Internal Medicine

## 2014-12-29 ENCOUNTER — Other Ambulatory Visit: Payer: Self-pay | Admitting: Internal Medicine

## 2015-01-01 ENCOUNTER — Ambulatory Visit (INDEPENDENT_AMBULATORY_CARE_PROVIDER_SITE_OTHER): Payer: Medicare Other | Admitting: *Deleted

## 2015-01-01 DIAGNOSIS — Z7901 Long term (current) use of anticoagulants: Secondary | ICD-10-CM

## 2015-01-01 DIAGNOSIS — I4891 Unspecified atrial fibrillation: Secondary | ICD-10-CM

## 2015-01-01 DIAGNOSIS — M549 Dorsalgia, unspecified: Secondary | ICD-10-CM | POA: Diagnosis not present

## 2015-01-01 DIAGNOSIS — Z6834 Body mass index (BMI) 34.0-34.9, adult: Secondary | ICD-10-CM | POA: Diagnosis not present

## 2015-01-01 LAB — POCT INR: INR: 1.4

## 2015-01-06 DIAGNOSIS — H52203 Unspecified astigmatism, bilateral: Secondary | ICD-10-CM | POA: Diagnosis not present

## 2015-01-06 DIAGNOSIS — E119 Type 2 diabetes mellitus without complications: Secondary | ICD-10-CM | POA: Diagnosis not present

## 2015-01-06 DIAGNOSIS — Z961 Presence of intraocular lens: Secondary | ICD-10-CM | POA: Diagnosis not present

## 2015-01-06 DIAGNOSIS — H26492 Other secondary cataract, left eye: Secondary | ICD-10-CM | POA: Diagnosis not present

## 2015-01-06 LAB — HM DIABETES EYE EXAM

## 2015-01-08 ENCOUNTER — Encounter: Payer: Self-pay | Admitting: Internal Medicine

## 2015-01-14 ENCOUNTER — Ambulatory Visit (INDEPENDENT_AMBULATORY_CARE_PROVIDER_SITE_OTHER): Payer: Medicare Other | Admitting: Internal Medicine

## 2015-01-14 ENCOUNTER — Encounter: Payer: Self-pay | Admitting: Internal Medicine

## 2015-01-14 ENCOUNTER — Telehealth: Payer: Self-pay | Admitting: Interventional Cardiology

## 2015-01-14 ENCOUNTER — Ambulatory Visit (INDEPENDENT_AMBULATORY_CARE_PROVIDER_SITE_OTHER): Payer: Medicare Other | Admitting: *Deleted

## 2015-01-14 VITALS — BP 130/68 | HR 75 | Ht 72.0 in | Wt 254.0 lb

## 2015-01-14 DIAGNOSIS — I255 Ischemic cardiomyopathy: Secondary | ICD-10-CM

## 2015-01-14 DIAGNOSIS — I482 Chronic atrial fibrillation, unspecified: Secondary | ICD-10-CM

## 2015-01-14 DIAGNOSIS — I5022 Chronic systolic (congestive) heart failure: Secondary | ICD-10-CM

## 2015-01-14 DIAGNOSIS — Z7901 Long term (current) use of anticoagulants: Secondary | ICD-10-CM | POA: Diagnosis not present

## 2015-01-14 DIAGNOSIS — Z95810 Presence of automatic (implantable) cardiac defibrillator: Secondary | ICD-10-CM

## 2015-01-14 DIAGNOSIS — I4891 Unspecified atrial fibrillation: Secondary | ICD-10-CM | POA: Diagnosis not present

## 2015-01-14 LAB — CUP PACEART INCLINIC DEVICE CHECK
Battery Remaining Longevity: 106 mo
Battery Voltage: 2.98 V
Brady Statistic AP VS Percent: 0 %
Brady Statistic AS VP Percent: 15.78 %
Brady Statistic RA Percent Paced: 0 %
HIGH POWER IMPEDANCE MEASURED VALUE: 73 Ohm
Implantable Lead Implant Date: 20090121
Implantable Lead Location: 753858
Implantable Lead Location: 753860
Implantable Lead Model: 4193
Lead Channel Impedance Value: 494 Ohm
Lead Channel Impedance Value: 532 Ohm
Lead Channel Pacing Threshold Amplitude: 0.75 V
Lead Channel Sensing Intrinsic Amplitude: 14.125 mV
Lead Channel Setting Pacing Amplitude: 2.5 V
Lead Channel Setting Sensing Sensitivity: 0.3 mV
MDC IDC LEAD IMPLANT DT: 20090121
MDC IDC LEAD IMPLANT DT: 20090121
MDC IDC LEAD LOCATION: 753859
MDC IDC LEAD MODEL: 6935
MDC IDC MSMT LEADCHNL LV IMPEDANCE VALUE: 4047 Ohm
MDC IDC MSMT LEADCHNL LV IMPEDANCE VALUE: 4047 Ohm
MDC IDC MSMT LEADCHNL RA SENSING INTR AMPL: 0.875 mV
MDC IDC MSMT LEADCHNL RA SENSING INTR AMPL: 1 mV
MDC IDC MSMT LEADCHNL RV IMPEDANCE VALUE: 380 Ohm
MDC IDC MSMT LEADCHNL RV IMPEDANCE VALUE: 494 Ohm
MDC IDC MSMT LEADCHNL RV PACING THRESHOLD PULSEWIDTH: 0.4 ms
MDC IDC MSMT LEADCHNL RV SENSING INTR AMPL: 12.5 mV
MDC IDC SESS DTM: 20161026141323
MDC IDC SET LEADCHNL RV PACING PULSEWIDTH: 0.4 ms
MDC IDC STAT BRADY AP VP PERCENT: 0 %
MDC IDC STAT BRADY AS VS PERCENT: 84.22 %
MDC IDC STAT BRADY RV PERCENT PACED: 20.02 %

## 2015-01-14 LAB — POCT INR: INR: 2.7

## 2015-01-14 NOTE — Patient Instructions (Signed)
Medication Instructions:  Your physician recommends that you continue on your current medications as directed. Please refer to the Current Medication list given to you today.  **Take your last Coumadin on Sunday.  Hold your Coumadin after that.  Restart your coumadin on Friday 01/23/15, after your procedure**  Labwork: None  ordered  Testing/Procedures: None ordered  Follow-Up: Remote monitoring is used to monitor your Pacemaker of ICD from home. This monitoring reduces the number of office visits required to check your device to one time per year. It allows [place] to keep an eye on the functioning of your device to ensure it is working properly. You are scheduled for a device check from home on 04/14/14. You may send your transmission at any time that day. If you have a wireless device, the transmission will be sent automatically. After your physician reviews your transmission, you will receive a postcard with your next transmission date.  Your physician wants you to follow-up in: 1 year with Dr. Lovena Le.  You will receive a reminder letter in the mail two months in advance. If you don't receive a letter, please call our office to schedule the follow-up appointment.   Any Other Special Instructions Will Be Listed Below (If Applicable).  - If you need a refill on your cardiac medications before your next appointment, please call your pharmacy.  Thank you for choosing [facility]!!

## 2015-01-14 NOTE — Assessment & Plan Note (Signed)
His ventricular rate is well controlled. No change in meds. 

## 2015-01-14 NOTE — Assessment & Plan Note (Signed)
His symptoms are well compensated, class 2. He will continue his current meds.

## 2015-01-14 NOTE — Progress Notes (Signed)
HPI [name] returns today for ongoing followup of his ICD and chronic systolic heart failure. He has polycythemia and his heart failure has improved with the initiation of phlebotomy. He has a h/o chronic atrial fibrillation. He has tried to reduce his salt intake. No ICD shocks. He has been scheduled for a hip injection next week.  Allergies  Allergen Reactions  . Avelox [Moxifloxacin Hcl In Nacl] Swelling, Rash and Other (See Comments)    Patient became hypotensive after infusion started Because of a history of documented adverse serious drug reaction;Medi Alert bracelet  is recommended  . Penicillins Anaphylaxis and Other (See Comments)    REACTION: anaphylaxis Because of a history of documented adverse serious drug reaction;Medi Alert bracelet  is recommended     Current Outpatient Prescriptions  Medication Sig Dispense Refill  . ACCU-CHEK FASTCLIX LANCETS MISC USE ONE LANCET TO CHECK BLOOD SUGAR THREE TIMES DAILY 102 each 5  . acetaminophen (TYLENOL) 325 MG tablet Take 325 mg by mouth every morning.     Marland Kitchen allopurinol (ZYLOPRIM) 100 MG tablet TAKE ONE TABLET BY MOUTH ONCE DAILY (Patient taking differently: TAKE 100 MG BY MOUTH ONCE DAILY) 90 tablet 1  . amLODipine (NORVASC) 5 MG tablet Take 2.5 mg by mouth daily.    Marland Kitchen aspirin 81 MG tablet Take 81 mg by mouth once a week.     Marland Kitchen atorvastatin (LIPITOR) 10 MG tablet TAKE ONE TABLET BY MOUTH ONCE DAILY 90 tablet 2  . carboxymethylcellulose (REFRESH PLUS) 0.5 % SOLN Place 2 drops into both eyes daily.     . carvedilol (COREG) 6.25 MG tablet Take 1 tablet (6.25 mg total) by mouth 2 (two) times daily. 180 tablet 1  . chlorthalidone (HYGROTON) 25 MG tablet Take 1 tablet daily as needed when weight reaches 246 lbs. or greater only (Patient taking differently: Take 25 mg by mouth daily as needed (when weight reaches 246 lbs or greater only). ) 45 tablet 3  . Cholecalciferol 10000 UNITS CAPS Take 1,000 Units by mouth daily.    .  colchicine 0.6 MG tablet TAKE ONE TABLET BY MOUTH AS NEEDED FOR  GOUT (Patient taking differently: TAKE 0.6MG  BY MOUTH AS NEEDED FOR  GOUT) 90 tablet 0  . furosemide (LASIX) 80 MG tablet Take 1 tablet (80 mg total) by mouth 2 (two) times daily. 180 tablet 3  . gabapentin (NEURONTIN) 300 MG capsule Take 1 tablet 4 times daily as needed for leg pain (Patient taking differently: Take 300 mg by mouth 4 (four) times daily. ) 360 capsule 3  . glucose blood (ACCU-CHEK AVIVA PLUS) test strip Use as instructed to check blood sugar 3 times per day dx code E11.49 100 each 5  . insulin detemir (LEVEMIR) 100 unit/ml SOLN Inject 22 Units into the skin at bedtime.    . insulin lispro (HUMALOG KWIKPEN) 100 UNIT/ML KiwkPen Inject 0.08-0.12 mLs (8-12 Units total) into the skin 2 (two) times daily. (Patient taking differently: Inject 8-10 Units into the skin 2 (two) times daily. ) 5 pen 3  . ipratropium (ATROVENT) 0.06 % nasal spray Place 1 spray into both nostrils daily.     Marland Kitchen losartan (COZAAR) 50 MG tablet Take 1 tablet (50 mg total) by mouth daily. 90 tablet 3  . naphazoline-pheniramine (NAPHCON-A) 0.025-0.3 % ophthalmic solution Place 1 drop into both eyes daily.     . nitroGLYCERIN (NITROSTAT) 0.4 MG SL tablet Place 1 tablet (0.4 mg total) under the tongue every 5 (five)  minutes as needed for chest pain (MAX 3 TABLETS). 25 tablet 3  . Potassium Chloride ER 20 MEQ TBCR Take 30 mcg by mouth 2 (two) times daily. (Patient taking differently: Take 30 mEq by mouth 2 (two) times daily. ) 60 tablet   . warfarin (COUMADIN) 2.5 MG tablet Take 2.5 mg daily except Wednesday, take 5 mg. (Patient taking differently: Take 2.5-5 mg by mouth daily. Take 2.5 mg daily except Wednesday, take 5 mg.) 130 tablet 0   No current facility-administered medications for this visit.     Past Medical History  Diagnosis Date  . Diabetes mellitus   . Hyperlipidemia   . Hypertension   . Pilonidal cyst   . Atrial fibrillation ([place])      Previous long-term amiodarone therapy with multiple cardioversions / amiodarone stopped September, 2009  . Atrial flutter [facility])     Started November, 2010, Left-sided and cannot ablate  . Left atrial thrombus [facility])     Remote past... cardioversions done since that time  . Wide-complex tachycardia ([place])   . Left ventricular ejection fraction less than 40%   . Gout   . AAA (abdominal aortic aneurysm) [facility])     Surgical repair  . Discolored skin   . S/P ICD (internal cardiac defibrillator) procedure     Dr. Lovena Le 2009... by the pacing  . SOB (shortness of breath)     Large left effusion/ thoracentesis/hospitalization/November, 2011... exudated.. cytology negative.. Dr.Wert.. no proof of mesothelioma  . Pericardial effusion   . Pleural effusion     Large loculated effusion on the left side November, 2011. This was tapped. It was exudative. Cytology revealed no cancer no proof of mesothelioma area pulmonary team felt that no further workup was needed  . S/P AAA repair   . Spinal stenosis     Surgery Dr.Elsner  . CAD (coronary artery disease)     Catheterization July, 2008... name and vein grafts patent but low cardiac output  . Warfarin anticoagulation   . Cardiomyopathy     Ischemic... ICD  . Combined systolic and diastolic CHF   . Venous insufficiency     Toe discoloration chronic  . Mitral regurgitation     Mild echo  . Aortic valve sclerosis   . Nasal drainage     Chronic  . Alcohol ingestion of more than four drinks per week     Excess beer  not a dependency problem  . Chronotropic incompetence     IV pacing rate adjusted  . Thrombophlebitis of superficial veins of upper extremities     Possible venous stenosis from defibrillator  . Pericardial effusion     November, 2011 .. decreased during hospitalization  . Eye abnormality     Ophthalmologist questions a clot in one of his eyes, May, 2012  . Overweight(278.02)     November, 2012  . Pleural thickening   . Ejection  fraction     Ejection fraction has varied over time from 35-50%.,, Echoes are technically very difficult,,, EF 50%, echo, May 25, 2011, technically very difficult  . Drug therapy     Redness and swelling with Avelox infusion May 24, 2011  . COPD (chronic obstructive pulmonary disease) ([place])   . Myocardial infarction ([place])   . Spinal cord stimulator status     October, 2013  . Carotid artery disease ([place])     Doppler, December, 2013, 0-39% bilateral  . Pneumonia   . Arthritis   . Ventral hernia  April, 2014, result of his abdominal surgery  . Bony abnormality     Patient's manubrium is slightly displaced to the right  . Ascending aortic aneurysm ([place]) 05/10/2014  . Polycythemia vera ([place]) 07/28/2014  . Internal hemorrhoids   . Diverticulosis   . Adenomatous colon polyp     ROS:   All systems reviewed and negative except as noted in the HPI.   Past Surgical History  Procedure Laterality Date  . Colonoscopy w/ polypectomy    . Lumbar fusion    . Pilonidal cyst removal    . Surgery scrotal / testicular    . Coronary artery bypass graft  2004  . Abdominal aortic aneurysm repair  11/2002  . Bi-ventricular pacemaker insertion (crt-p)  02-11-2013    Pt with previously implanted MDT CRTD downgraded to CRTP by Dr Lovena Le 02-11-13  . Back surgery    . Incision and drainage abscess / hematoma of bursa / knee / thigh    . Implantable cardioverter defibrillator (icd) generator change N/A 02/11/2013    Procedure: ICD GENERATOR CHANGE;  Surgeon: Evans Lance, MD;  Location[facility] C[facility];  Service: Cardiovascular;  Laterality: N/A;  . Leg surgery Right      Family History  Problem Relation Age of Onset  . Hypertension Mother   . Stroke Mother   . Diabetes Father   . Coronary artery disease Father   . Other Father     DVT  . Colon cancer Neg Hx   . Diabetes Brother      Social History   Social History  . Marital Status: Married    Spouse Name: N/A  . Number of Children:  N/A  . Years of Education: N/A   Occupational History  . Retired- Nurse, mental health    Social History Main Topics  . Smoking status: Former Smoker -- 3.00 packs/day for 39 years    Types: Cigarettes    Start date: 05/24/1956    Quit date: 03/22/1995  . Smokeless tobacco: Never Used     Comment: quit smoking 18 years ago  . Alcohol Use: 1.2 oz/week    2 Cans of beer per week     Comment: beer  . Drug Use: No  . Sexual Activity: No   Other Topics Concern  . Not on file   Social History Narrative     BP 130/68 mmHg  Pulse 75  Ht 6' (1.829 m)  Wt 254 lb (115.214 kg)  BMI 34.44 kg/m2  Physical Exam:  Well appearing 74 yo man, NAD HEENT: Unremarkable Neck:  No JVD, no thyromegally Lymphatics:  No adenopathy Back:  No CVA tenderness Lungs:  Clear with no wheezes HEART:  Regular rate rhythm, no murmurs, no rubs, no clicks Abd:  soft, positive bowel sounds, no organomegally, no rebound, no guarding Ext:  2 plus pulses, no edema, no cyanosis, no clubbing Skin:  No rashes no nodules Neuro:  CN II through XII intact, motor grossly intact   DEVICE  Normal device function.  See PaceArt for details.   Assess/Plan:

## 2015-01-14 NOTE — Assessment & Plan Note (Signed)
I have instructed the patient to hold his warfarin 5 days prior to his hip injection, and restart warfarin the day of the procedure.

## 2015-01-14 NOTE — Assessment & Plan Note (Signed)
HIs DDD ICD is working normally. He is programmed VVI. Will recheck in several months.

## 2015-01-14 NOTE — Telephone Encounter (Signed)
Error

## 2015-01-15 DIAGNOSIS — H26492 Other secondary cataract, left eye: Secondary | ICD-10-CM | POA: Diagnosis not present

## 2015-01-15 DIAGNOSIS — H2640 Unspecified secondary cataract: Secondary | ICD-10-CM | POA: Diagnosis not present

## 2015-01-16 ENCOUNTER — Telehealth: Payer: Self-pay | Admitting: Internal Medicine

## 2015-01-16 NOTE — Telephone Encounter (Signed)
LMOM explaining that remote transmission was successfully received on 01/14/15 and to call with any additional questions or concerns.

## 2015-01-16 NOTE — Telephone Encounter (Signed)
New Message  Pt wife calling to see if remote transmission sent 10/26 was successful. Please call back and discuss.

## 2015-01-21 ENCOUNTER — Encounter: Payer: Self-pay | Admitting: Internal Medicine

## 2015-01-23 DIAGNOSIS — M533 Sacrococcygeal disorders, not elsewhere classified: Secondary | ICD-10-CM | POA: Diagnosis not present

## 2015-01-23 DIAGNOSIS — M4726 Other spondylosis with radiculopathy, lumbar region: Secondary | ICD-10-CM | POA: Diagnosis not present

## 2015-01-26 ENCOUNTER — Telehealth: Payer: Self-pay | Admitting: Endocrinology

## 2015-01-26 ENCOUNTER — Telehealth: Payer: Self-pay | Admitting: *Deleted

## 2015-01-26 NOTE — Telephone Encounter (Signed)
Alternatively she can give me the readings for the last 3-4 days

## 2015-01-26 NOTE — Telephone Encounter (Signed)
Called patients wife back and told her it was okay to bring the meter in for download.

## 2015-01-26 NOTE — Telephone Encounter (Signed)
Pt wife calling to see if they can run the pts meter by since he was taken off metformin his BS have gone up

## 2015-01-26 NOTE — Telephone Encounter (Signed)
Noted, patients wife is aware. 

## 2015-01-26 NOTE — Telephone Encounter (Signed)
Patients wife stopped by to have her husband meter downloaded, she said since he's been off of the Metformin his sugars have went up, but last Friday 11/4 he had 2 steroid injections and his sugars shot up even higher. Reports are on your desk. Please advise.

## 2015-01-26 NOTE — Telephone Encounter (Signed)
New insulin doses: 28 Levemir hs, reduce it back to 25 units when  blood sugars in the mornings are below 140 Humalog 10 lunch--16 units ac supper If kidney test is back to normal may be able to restart metformin

## 2015-02-03 ENCOUNTER — Ambulatory Visit (INDEPENDENT_AMBULATORY_CARE_PROVIDER_SITE_OTHER): Payer: Medicare Other | Admitting: *Deleted

## 2015-02-03 DIAGNOSIS — I4891 Unspecified atrial fibrillation: Secondary | ICD-10-CM | POA: Diagnosis not present

## 2015-02-03 DIAGNOSIS — Z7901 Long term (current) use of anticoagulants: Secondary | ICD-10-CM | POA: Diagnosis not present

## 2015-02-03 LAB — POCT INR: INR: 1.8

## 2015-02-04 ENCOUNTER — Ambulatory Visit (HOSPITAL_BASED_OUTPATIENT_CLINIC_OR_DEPARTMENT_OTHER): Payer: Medicare Other

## 2015-02-04 ENCOUNTER — Encounter: Payer: Self-pay | Admitting: Hematology & Oncology

## 2015-02-04 ENCOUNTER — Ambulatory Visit (HOSPITAL_BASED_OUTPATIENT_CLINIC_OR_DEPARTMENT_OTHER): Payer: Medicare Other | Admitting: Hematology & Oncology

## 2015-02-04 ENCOUNTER — Other Ambulatory Visit (HOSPITAL_BASED_OUTPATIENT_CLINIC_OR_DEPARTMENT_OTHER): Payer: Medicare Other

## 2015-02-04 VITALS — BP 133/89 | HR 66 | Temp 97.3°F | Resp 16 | Ht 72.0 in | Wt 253.0 lb

## 2015-02-04 VITALS — BP 118/64 | HR 61 | Temp 98.2°F | Resp 18

## 2015-02-04 DIAGNOSIS — D45 Polycythemia vera: Secondary | ICD-10-CM | POA: Diagnosis not present

## 2015-02-04 DIAGNOSIS — D582 Other hemoglobinopathies: Secondary | ICD-10-CM

## 2015-02-04 LAB — CBC WITH DIFFERENTIAL (CANCER CENTER ONLY)
BASO#: 0 10*3/uL (ref 0.0–0.2)
BASO%: 0.1 % (ref 0.0–2.0)
EOS%: 0.3 % (ref 0.0–7.0)
Eosinophils Absolute: 0 10*3/uL (ref 0.0–0.5)
HEMATOCRIT: 48.6 % (ref 38.7–49.9)
HEMOGLOBIN: 16.3 g/dL (ref 13.0–17.1)
LYMPH#: 1.1 10*3/uL (ref 0.9–3.3)
LYMPH%: 10.7 % — ABNORMAL LOW (ref 14.0–48.0)
MCH: 31.1 pg (ref 28.0–33.4)
MCHC: 33.5 g/dL (ref 32.0–35.9)
MCV: 93 fL (ref 82–98)
MONO#: 0.8 10*3/uL (ref 0.1–0.9)
MONO%: 8.1 % (ref 0.0–13.0)
NEUT%: 80.8 % — AB (ref 40.0–80.0)
NEUTROS ABS: 8.3 10*3/uL — AB (ref 1.5–6.5)
Platelets: 140 10*3/uL — ABNORMAL LOW (ref 145–400)
RBC: 5.24 10*6/uL (ref 4.20–5.70)
RDW: 15.2 % (ref 11.1–15.7)
WBC: 10.3 10*3/uL — ABNORMAL HIGH (ref 4.0–10.0)

## 2015-02-04 NOTE — Progress Notes (Signed)
Hematology and Oncology Follow Up Visit  [name] [id number] July 02, 1940 74 y.o. 02/04/2015   Principle Diagnosis:   Polycythemia vera  Current Therapy:    Phlebotomy to maintain hematocrit below 45%.       Aspirin 81 mg by mouth weekly       Coumadin for chronic atrial fibrillation      Interim History:  [name] is back for follow-up. He feels pretty well. He is having a lot of back issues. He ultimately underwent a myelogram. It sounds like nothing to be done after finding the myelogram results.  He is getting some epidural injections. It sounds like steroids. His blood sugars have been quite high.  He is had no cardiac issues.  He's had no problems with bleeding. He is on Coumadin.  He's not noted any fever. He's had no increase in leg swelling.  He does have some balance issues.  Overall, his performance status is ECOG 1-2.   Medications:  Current outpatient prescriptions:  .  ACCU-CHEK FASTCLIX LANCETS MISC, USE ONE LANCET TO CHECK BLOOD SUGAR THREE TIMES DAILY, Disp: 102 each, Rfl: 5 .  acetaminophen (TYLENOL) 325 MG tablet, Take 325 mg by mouth every morning. , Disp: , Rfl:  .  allopurinol (ZYLOPRIM) 100 MG tablet, TAKE ONE TABLET BY MOUTH ONCE DAILY (Patient taking differently: TAKE 100 MG BY MOUTH ONCE DAILY), Disp: 90 tablet, Rfl: 1 .  amLODipine (NORVASC) 5 MG tablet, Take 2.5 mg by mouth daily., Disp: , Rfl:  .  aspirin 81 MG tablet, Take 81 mg by mouth once a week. , Disp: , Rfl:  .  atorvastatin (LIPITOR) 10 MG tablet, TAKE ONE TABLET BY MOUTH ONCE DAILY, Disp: 90 tablet, Rfl: 2 .  carboxymethylcellulose (REFRESH PLUS) 0.5 % SOLN, Place 2 drops into both eyes daily. , Disp: , Rfl:  .  carvedilol (COREG) 6.25 MG tablet, Take 1 tablet (6.25 mg total) by mouth 2 (two) times daily., Disp: 180 tablet, Rfl: 1 .  chlorthalidone (HYGROTON) 25 MG tablet, Take 1 tablet daily as needed when weight reaches 246 lbs. or greater only (Patient taking  differently: Take 25 mg by mouth daily as needed (when weight reaches 246 lbs or greater only). ), Disp: 45 tablet, Rfl: 3 .  Cholecalciferol 10000 UNITS CAPS, Take 1,000 Units by mouth daily., Disp: , Rfl:  .  colchicine 0.6 MG tablet, TAKE ONE TABLET BY MOUTH AS NEEDED FOR  GOUT (Patient taking differently: TAKE 0.6MG  BY MOUTH AS NEEDED FOR  GOUT), Disp: 90 tablet, Rfl: 0 .  furosemide (LASIX) 80 MG tablet, Take 1 tablet (80 mg total) by mouth 2 (two) times daily., Disp: 180 tablet, Rfl: 3 .  gabapentin (NEURONTIN) 300 MG capsule, Take 1 tablet 4 times daily as needed for leg pain (Patient taking differently: Take 300 mg by mouth 4 (four) times daily. ), Disp: 360 capsule, Rfl: 3 .  glucose blood (ACCU-CHEK AVIVA PLUS) test strip, Use as instructed to check blood sugar 3 times per day dx code E11.49, Disp: 100 each, Rfl: 5 .  insulin detemir (LEVEMIR) 100 unit/ml SOLN, Inject 22 Units into the skin at bedtime., Disp: , Rfl:  .  insulin lispro (HUMALOG KWIKPEN) 100 UNIT/ML KiwkPen, Inject 0.08-0.12 mLs (8-12 Units total) into the skin 2 (two) times daily. (Patient taking differently: Inject 8-10 Units into the skin 2 (two) times daily. ), Disp: 5 pen, Rfl: 3 .  ipratropium (ATROVENT) 0.06 % nasal spray, Place 1 spray into both  nostrils daily. , Disp: , Rfl:  .  losartan (COZAAR) 50 MG tablet, Take 1 tablet (50 mg total) by mouth daily., Disp: 90 tablet, Rfl: 3 .  naphazoline-pheniramine (NAPHCON-A) 0.025-0.3 % ophthalmic solution, Place 1 drop into both eyes daily. , Disp: , Rfl:  .  nitroGLYCERIN (NITROSTAT) 0.4 MG SL tablet, Place 1 tablet (0.4 mg total) under the tongue every 5 (five) minutes as needed for chest pain (MAX 3 TABLETS)., Disp: 25 tablet, Rfl: 3 .  Potassium Chloride ER 20 MEQ TBCR, Take 30 mcg by mouth 2 (two) times daily. (Patient taking differently: Take 30 mEq by mouth 2 (two) times daily. ), Disp: 60 tablet, Rfl:  .  warfarin (COUMADIN) 2.5 MG tablet, Take 2.5 mg daily except  Wednesday, take 5 mg. (Patient taking differently: Take 2.5-5 mg by mouth daily. Take 2.5 mg daily except Wednesday, take 5 mg.), Disp: 130 tablet, Rfl: 0  Allergies:  Allergies  Allergen Reactions  . Avelox [Moxifloxacin Hcl In Nacl] Swelling, Rash and Other (See Comments)    Patient became hypotensive after infusion started Because of a history of documented adverse serious drug reaction;Medi Alert bracelet  is recommended  . Penicillins Anaphylaxis and Other (See Comments)    REACTION: anaphylaxis Because of a history of documented adverse serious drug reaction;Medi Alert bracelet  is recommended    Past Medical History, Surgical history, Social history, and Family History were reviewed and updated.  Review of Systems: As above  Physical Exam:  height is 6' (1.829 m) and weight is 253 lb (114.76 kg). His oral temperature is 97.3 F (36.3 C). His blood pressure is 133/89 and his pulse is 66. His respiration is 16.   Well-developed and well-nourished white [name]. He is moderately obese. Head and neck exam shows no ocular or oral lesions. There is less conjunctival inflammation. There is no adenopathy in his neck. Lungs are clear. He has no rales, wheezes or rhonchi. Cardiac exam irregular rate and rhythm consistent with atrial fibrillation, he has  no murmurs, rubs or bruits. Abdomen is soft. He has good bowel sounds. He is somewhat obese. There is no palpable liver or spleen tip. Back exam no tenderness over the spine, ribs or hips. Extremities shows some chronic mild nonpitting edema of the legs. Skin exam no rashes. He still has a little bit of a ruddy complexion. Neurological exam is nonfocal.  Lab Results  Component Value Date   WBC 10.3* 02/04/2015   HGB 16.3 02/04/2015   HCT 48.6 02/04/2015   MCV 93 02/04/2015   PLT 140* 02/04/2015     Chemistry      Component Value Date/Time   NA 141 12/03/2014 1345   NA 140 11/05/2014 1345   NA 144 04/22/2014 1350   K 3.8 12/03/2014  1345   K 3.9 11/05/2014 1345   K 3.9 04/22/2014 1350   CL 101 11/05/2014 1345   CL 100 04/22/2014 1350   CO2 29 12/03/2014 1345   CO2 29 11/05/2014 1345   CO2 29 04/22/2014 1350   BUN 49.5* 12/03/2014 1345   BUN 30* 11/05/2014 1345   BUN 30* 04/22/2014 1350   CREATININE 2.0* 12/03/2014 1345   CREATININE 1.55* 11/05/2014 1345   CREATININE 1.6* 04/22/2014 1350      Component Value Date/Time   CALCIUM 9.6 12/03/2014 1345   CALCIUM 9.3 11/05/2014 1345   CALCIUM 9.1 04/22/2014 1350   ALKPHOS 62 09/03/2014 1407   ALKPHOS 55 04/22/2014 1350   AST 11 09/03/2014  1407   AST 16 04/22/2014 1350   ALT 8 09/03/2014 1407   ALT 15 04/22/2014 1350   BILITOT 0.6 09/03/2014 1407   BILITOT 1.00 04/22/2014 1350         Impression and Plan: [name] is 74 year old male with polycythemia. Actually, i believe that he does have polycythemia. Again, his studies are equivocal but clinically, I would say polycythemia is probable.  I still don't think we have to do a bone marrow test on him.  He does need to be phlebotomized today. I will like to try to get him through the holidays and hopefully get him through the wintertime.  Unfortunately, his wife was recently diagnosed with cancer. She is not sure what kind she has. She apparently had a lymph node in the right inguinal region. She had this excised. She says that despite multiple consultations on the pathology specimen, that no one really knows exactly what she has. To me, sounds like she has myeloma. She had a myeloma taken off her right leg many years ago. She is going to have a PET scan tomorrow.  We talked a little bit about what the prognosis might be and what the options might be. I told her that we will be more happy to see her if she does need systemic therapy.   Volanda Napoleon, MD 11/16/20165:56 PM

## 2015-02-04 NOTE — Patient Instructions (Signed)

## 2015-02-05 LAB — IRON AND TIBC CHCC
%SAT: 41 % (ref 20–55)
IRON: 148 ug/dL (ref 42–163)
TIBC: 363 ug/dL (ref 202–409)
UIBC: 214 ug/dL (ref 117–376)

## 2015-02-05 LAB — FERRITIN CHCC: Ferritin: 62 ng/ml (ref 22–316)

## 2015-02-11 ENCOUNTER — Other Ambulatory Visit: Payer: Self-pay | Admitting: Endocrinology

## 2015-02-17 ENCOUNTER — Telehealth: Payer: Self-pay | Admitting: Interventional Cardiology

## 2015-02-17 NOTE — Telephone Encounter (Signed)
The pts wife, Joaquim Lai, called to give the pts daily weights. Noted.

## 2015-02-17 NOTE — Telephone Encounter (Signed)
New Message    Pt's wife calling stating that she is "returning a phone call from [place]." Please call back.

## 2015-02-24 ENCOUNTER — Other Ambulatory Visit: Payer: Self-pay | Admitting: Cardiology

## 2015-02-24 ENCOUNTER — Ambulatory Visit (INDEPENDENT_AMBULATORY_CARE_PROVIDER_SITE_OTHER): Payer: Medicare Other | Admitting: Interventional Cardiology

## 2015-02-24 ENCOUNTER — Ambulatory Visit (INDEPENDENT_AMBULATORY_CARE_PROVIDER_SITE_OTHER): Payer: Medicare Other | Admitting: *Deleted

## 2015-02-24 ENCOUNTER — Encounter: Payer: Self-pay | Admitting: Interventional Cardiology

## 2015-02-24 VITALS — BP 110/65 | HR 75 | Ht 72.0 in | Wt 255.0 lb

## 2015-02-24 DIAGNOSIS — Z7901 Long term (current) use of anticoagulants: Secondary | ICD-10-CM | POA: Diagnosis not present

## 2015-02-24 DIAGNOSIS — E1159 Type 2 diabetes mellitus with other circulatory complications: Secondary | ICD-10-CM

## 2015-02-24 DIAGNOSIS — I2510 Atherosclerotic heart disease of native coronary artery without angina pectoris: Secondary | ICD-10-CM | POA: Diagnosis not present

## 2015-02-24 DIAGNOSIS — I4891 Unspecified atrial fibrillation: Secondary | ICD-10-CM

## 2015-02-24 DIAGNOSIS — I5022 Chronic systolic (congestive) heart failure: Secondary | ICD-10-CM

## 2015-02-24 DIAGNOSIS — I482 Chronic atrial fibrillation, unspecified: Secondary | ICD-10-CM

## 2015-02-24 DIAGNOSIS — D45 Polycythemia vera: Secondary | ICD-10-CM | POA: Diagnosis not present

## 2015-02-24 LAB — POCT INR: INR: 2

## 2015-02-24 NOTE — Patient Instructions (Signed)
**Note De-identified Nygeria Lager Obfuscation** Medication Instructions:  Same-no changes  Labwork: None  Testing/Procedures: None  Follow-Up: Your physician wants you to follow-up in: 6 months. You will receive a reminder letter in the mail two months in advance. If you don't receive a letter, please call our office to schedule the follow-up appointment.      If you need a refill on your cardiac medications before your next appointment, please call your pharmacy.   

## 2015-02-24 NOTE — Progress Notes (Signed)
Patient ID: [name], male   DOB: 03/04/1941, 74 y.o.   MRN: [id number]     Cardiology Office Note   Date:  02/24/2015   ID:  [name], DOB 03-30-1940, MRN [id number]  PCP:  [name], MD    No chief complaint on file. f/u LV systolic dysfunction   Wt Readings from Last 3 Encounters:  02/24/15 255 lb (115.667 kg)  02/04/15 253 lb (114.76 kg)  01/14/15 254 lb (115.214 kg)       History of Present Illness: [name] is a 74 y.o. male  Who prefers to be called "[name]," who has a h/o CAD, MI.  He had diaphoresis and diffuse pain  with his MI.  He ended up having CABG.  His EF is in the 35-40% range.  He has been managed for chronic left ventricular systolic dysfunction over the last several years by Dr. [name]. He has had issues with back pain and has had several injections. He weighs every day. He is in contact with my nurse Jeani Hawking regarding his weight. When his weight goes above 246 pounds, he takes a chlorthalidone tablet.  He denies any chest discomfort or shortness of breath. He is not having any trouble lying flat. He is not having any lower extremity edema.    Past Medical History  Diagnosis Date  . Diabetes mellitus   . Hyperlipidemia   . Hypertension   . Pilonidal cyst   . Atrial fibrillation ([place])     Previous long-term amiodarone therapy with multiple cardioversions / amiodarone stopped September, 2009  . Atrial flutter [facility])     Started November, 2010, Left-sided and cannot ablate  . Left atrial thrombus [facility])     Remote past... cardioversions done since that time  . Wide-complex tachycardia ([place])   . Left ventricular ejection fraction less than 40%   . Gout   . AAA (abdominal aortic aneurysm) [facility])     Surgical repair  . Discolored skin   . S/P ICD (internal cardiac defibrillator) procedure     Dr. Lovena Le 2009... by the pacing  . SOB (shortness of breath)     Large left effusion/ thoracentesis/hospitalization/November, 2011...  exudated.. cytology negative.. Dr.Wert.. no proof of mesothelioma  . Pericardial effusion   . Pleural effusion     Large loculated effusion on the left side November, 2011. This was tapped. It was exudative. Cytology revealed no cancer no proof of mesothelioma area pulmonary team felt that no further workup was needed  . S/P AAA repair   . Spinal stenosis     Surgery Dr.Elsner  . CAD (coronary artery disease)     Catheterization July, 2008... name and vein grafts patent but low cardiac output  . Warfarin anticoagulation   . Cardiomyopathy     Ischemic... ICD  . Combined systolic and diastolic CHF   . Venous insufficiency     Toe discoloration chronic  . Mitral regurgitation     Mild echo  . Aortic valve sclerosis   . Nasal drainage     Chronic  . Alcohol ingestion of more than four drinks per week     Excess beer  not a dependency problem  . Chronotropic incompetence     IV pacing rate adjusted  . Thrombophlebitis of superficial veins of upper extremities     Possible venous stenosis from defibrillator  . Pericardial effusion     November, 2011 .. decreased during hospitalization  . Eye abnormality  Ophthalmologist questions a clot in one of his eyes, May, 2012  . Overweight(278.02)     November, 2012  . Pleural thickening   . Ejection fraction     Ejection fraction has varied over time from 35-50%.,, Echoes are technically very difficult,,, EF 50%, echo, May 25, 2011, technically very difficult  . Drug therapy     Redness and swelling with Avelox infusion May 24, 2011  . COPD (chronic obstructive pulmonary disease) ([place])   . Myocardial infarction ([place])   . Spinal cord stimulator status     October, 2013  . Carotid artery disease ([place])     Doppler, December, 2013, 0-39% bilateral  . Pneumonia   . Arthritis   . Ventral hernia     April, 2014, result of his abdominal surgery  . Bony abnormality     Patient's manubrium is slightly displaced to the right  . Ascending  aortic aneurysm ([place]) 05/10/2014  . Polycythemia vera ([place]) 07/28/2014  . Internal hemorrhoids   . Diverticulosis   . Adenomatous colon polyp     Past Surgical History  Procedure Laterality Date  . Colonoscopy w/ polypectomy    . Lumbar fusion    . Pilonidal cyst removal    . Surgery scrotal / testicular    . Coronary artery bypass graft  2004  . Abdominal aortic aneurysm repair  11/2002  . Bi-ventricular pacemaker insertion (crt-p)  02-11-2013    Pt with previously implanted MDT CRTD downgraded to CRTP by Dr Lovena Le 02-11-13  . Back surgery    . Incision and drainage abscess / hematoma of bursa / knee / thigh    . Implantable cardioverter defibrillator (icd) generator change N/A 02/11/2013    Procedure: ICD GENERATOR CHANGE;  Surgeon: Evans Lance, MD;  Location: [facility] CATH LAB;  Service: Cardiovascular;  Laterality: N/A;  . Leg surgery Right      Current Outpatient Prescriptions  Medication Sig Dispense Refill  . ACCU-CHEK FASTCLIX LANCETS MISC USE ONE  TO CHECK BLOOD SUGAR THREE TIMES DAILY 102 each 5  . acetaminophen (TYLENOL) 325 MG tablet Take 325 mg by mouth every morning.     Marland Kitchen allopurinol (ZYLOPRIM) 100 MG tablet Take 100 mg by mouth daily.    Marland Kitchen amLODipine (NORVASC) 5 MG tablet Take 2.5 mg by mouth daily.    Marland Kitchen aspirin 81 MG tablet Take 81 mg by mouth once a week.     Marland Kitchen atorvastatin (LIPITOR) 10 MG tablet TAKE ONE TABLET BY MOUTH ONCE DAILY 90 tablet 2  . carboxymethylcellulose (REFRESH PLUS) 0.5 % SOLN Place 2 drops into both eyes daily.     . carvedilol (COREG) 6.25 MG tablet Take 1 tablet (6.25 mg total) by mouth 2 (two) times daily. 180 tablet 1  . chlorthalidone (HYGROTON) 25 MG tablet Take 1 tablet daily as needed when weight reaches 246 lbs. or greater only (Patient taking differently: Take 25 mg by mouth daily as needed (when weight reaches 246 lbs or greater only). ) 45 tablet 3  . Cholecalciferol 10000 UNITS CAPS Take 1,000 Units by mouth daily.    . colchicine 0.6 MG  tablet Take 0.6 mg by mouth as needed (GOUT).    . furosemide (LASIX) 80 MG tablet Take 1 tablet (80 mg total) by mouth 2 (two) times daily. 180 tablet 3  . gabapentin (NEURONTIN) 300 MG capsule Take 300 mg by mouth 4 (four) times daily. LEG PAIN    . glucose blood (ACCU-CHEK AVIVA PLUS) test strip  Use as instructed to check blood sugar 3 times per day dx code E11.49 100 each 5  . insulin detemir (LEVEMIR) 100 unit/ml SOLN Inject 22 Units into the skin at bedtime.    . insulin lispro (HUMALOG KWIKPEN) 100 UNIT/ML KiwkPen Inject 0.08-0.12 mLs (8-12 Units total) into the skin 2 (two) times daily. (Patient taking differently: Inject 8-10 Units into the skin 2 (two) times daily. ) 5 pen 3  . ipratropium (ATROVENT) 0.06 % nasal spray Place 1 spray into both nostrils daily.     Marland Kitchen losartan (COZAAR) 50 MG tablet Take 1 tablet (50 mg total) by mouth daily. 90 tablet 3  . naphazoline-pheniramine (NAPHCON-A) 0.025-0.3 % ophthalmic solution Place 1 drop into both eyes daily.     . nitroGLYCERIN (NITROSTAT) 0.4 MG SL tablet Place 1 tablet (0.4 mg total) under the tongue every 5 (five) minutes as needed for chest pain (MAX 3 TABLETS). 25 tablet 3  . potassium chloride SA (K-DUR,KLOR-CON) 20 MEQ tablet Take 20.5 mEq by mouth 2 (two) times daily. TAKE 1.5 TWICE DAILY TO TOTAL 30 MEQ    . warfarin (COUMADIN) 2.5 MG tablet Take 2.5 mg daily except Wednesday, take 5 mg. (Patient taking differently: Take 2.5-5 mg by mouth daily. Take 2.5 mg daily except Wednesday, take 5 mg.) 130 tablet 0   No current facility-administered medications for this visit.    Allergies:   Avelox; Penicillins; and Quinolones    Social History:  The patient  reports that he quit smoking about 19 years ago. His smoking use included Cigarettes. He started smoking about 58 years ago. He has a 117 pack-year smoking history. He has never used smokeless tobacco. He reports that he drinks about 1.2 oz of alcohol per week. He reports that he does  not use illicit drugs.   Family History:  The patient's family history includes Coronary artery disease in his father; Diabetes in his brother and father; Hypertension in his mother; Other in his father; Stroke in his mother. There is no history of Colon cancer or Heart attack.    ROS:  Please see the history of present illness.   Otherwise, review of systems are positive for back pain.   All other systems are reviewed and negative.    PHYSICAL EXAM: VS:  BP 110/65 mmHg  Pulse 75  Ht 6' (1.829 m)  Wt 255 lb (115.667 kg)  BMI 34.58 kg/m2  SpO2 99% , BMI Body mass index is 34.58 kg/(m^2). GEN: Well nourished, well developed, in no acute distress HEENT: normal Neck: no JVD, carotid bruits, or masses Cardiac: Irregularly irregular; no murmurs, rubs, or gallops,no edema  Respiratory:  clear to auscultation bilaterally, normal work of breathing GI: soft, nontender, nondistended, + BS MS: no deformity or atrophy, Right hand bandaged Skin: warm and dry, no rash Neuro:  Strength and sensation are intact Psych: euthymic mood, full affect    Recent Labs: 05/05/2014: B Natriuretic Peptide 607.3* 05/08/2014: Magnesium 2.7* 05/14/2014: Pro B Natriuretic peptide (BNP) 670.0* 09/03/2014: ALT 8 10/07/2014: TSH 0.59 12/03/2014: BUN 49.5*; Creatinine 2.0*; Potassium 3.8; Sodium 141 02/04/2015: HGB 16.3; Platelets 140*   Lipid Panel    Component Value Date/Time   CHOL 152 09/02/2014 1034   TRIG 112.0 09/02/2014 1034   HDL 40.00 09/02/2014 1034   CHOLHDL 4 09/02/2014 1034   VLDL 22.4 09/02/2014 1034   [place] 90 09/02/2014 1034     Other studies Reviewed: Additional studies/ records that were reviewed today with results demonstrating: EF 35-40%  in 2/16.  .   ASSESSMENT AND PLAN:  1. AFib: rate controlled.  Pacer for bradycardia.  Coumadin for stroke prevention. 2. CAD: s/p CABG.  He had distinct sx with his prior MI.  3. Chronic systolic heart failure:  Appears euvolemic.  Continue daily  weights and management of volume status.  He will continue to be in contact with Jeani Hawking.   4. DM: Followed by PMD.   5. Polycythemia vera: Feels much better after his recent phlebotomy treatment.     Current medicines are reviewed at length with the patient today.  The patient concerns regarding his medicines were addressed.  The following changes have been made:  No change  Labs/ tests ordered today include:  No orders of the defined types were placed in this encounter.    Recommend 150 minutes/week of aerobic exercise Low fat, low carb, high fiber diet recommended  Disposition:   FU in 1 year   Teresita Madura., MD  02/24/2015 3:19 PM    [facility] [address], [place], [place]  [address] Phone: [phone]; Fax: [phone]

## 2015-02-25 ENCOUNTER — Other Ambulatory Visit: Payer: Self-pay | Admitting: *Deleted

## 2015-02-26 DIAGNOSIS — H2512 Age-related nuclear cataract, left eye: Secondary | ICD-10-CM | POA: Diagnosis not present

## 2015-03-03 ENCOUNTER — Other Ambulatory Visit (INDEPENDENT_AMBULATORY_CARE_PROVIDER_SITE_OTHER): Payer: Medicare Other

## 2015-03-03 DIAGNOSIS — E1165 Type 2 diabetes mellitus with hyperglycemia: Secondary | ICD-10-CM | POA: Diagnosis not present

## 2015-03-03 DIAGNOSIS — IMO0002 Reserved for concepts with insufficient information to code with codable children: Secondary | ICD-10-CM

## 2015-03-03 LAB — COMPREHENSIVE METABOLIC PANEL
ALBUMIN: 3.9 g/dL (ref 3.5–5.2)
ALK PHOS: 54 U/L (ref 39–117)
ALT: 11 U/L (ref 0–53)
AST: 11 U/L (ref 0–37)
BUN: 35 mg/dL — ABNORMAL HIGH (ref 6–23)
CO2: 32 mEq/L (ref 19–32)
Calcium: 9.2 mg/dL (ref 8.4–10.5)
Chloride: 102 mEq/L (ref 96–112)
Creatinine, Ser: 1.66 mg/dL — ABNORMAL HIGH (ref 0.40–1.50)
GFR: 43.27 mL/min — AB (ref >60.00)
Glucose, Bld: 144 mg/dL — ABNORMAL HIGH (ref 70–99)
POTASSIUM: 4 meq/L (ref 3.5–5.1)
Sodium: 142 mEq/L (ref 135–145)
TOTAL PROTEIN: 6.3 g/dL (ref 6.0–8.3)
Total Bilirubin: 0.8 mg/dL (ref 0.2–1.2)

## 2015-03-03 LAB — HEMOGLOBIN A1C: HEMOGLOBIN A1C: 7.2 % — AB (ref 4.6–6.5)

## 2015-03-06 ENCOUNTER — Ambulatory Visit (INDEPENDENT_AMBULATORY_CARE_PROVIDER_SITE_OTHER): Payer: Medicare Other | Admitting: Endocrinology

## 2015-03-06 ENCOUNTER — Encounter: Payer: Self-pay | Admitting: Endocrinology

## 2015-03-06 VITALS — BP 128/84 | HR 79 | Temp 98.7°F | Resp 16 | Ht 72.0 in | Wt 258.2 lb

## 2015-03-06 DIAGNOSIS — E1165 Type 2 diabetes mellitus with hyperglycemia: Secondary | ICD-10-CM | POA: Diagnosis not present

## 2015-03-06 DIAGNOSIS — Z794 Long term (current) use of insulin: Secondary | ICD-10-CM | POA: Diagnosis not present

## 2015-03-06 NOTE — Progress Notes (Signed)
Patient ID: [name], male   DOB: [birth year], 74 y.o.   MRN: [id number]    Reason for Appointment : F/u for Type 2 Diabetes  History of Present Illness          Diagnosis: Type 2 diabetes mellitus, date of diagnosis: 2008       Past history: He was initially treated with Metformin and also given Amayl at some point. With this he had fair control of his diabetes with A1c had usually been over 7% except once in 2013.  He was taken off metformin probably in March this year after a hospitalization, possibly because of renal dysfunction He was started on Levemir insulin in May when his blood sugars were significantly higher, fasting readings averaging 220.  Levemir progressively increased and was on 35 units at night since 12/20/12 He has not been on any other medications either orally or injectable for his diabetes Because of his A1c of over 10% in 10/14 on basal insulin alone he was started on NovoLog with each meal in 10/14  Recent history:     INSULIN regimen is described as:  22 Levemir hs Humalog 8 lunch--12/14 units ac supper  His blood sugars have been more stable overall in the last few months However he did have short-term hyperglycemia in early November after getting a steroid injection More recently has been back to about the same insulin doses as before except about 2 more units at dinner However his renal function has been inadequate and metformin has been stopped   A1c is slightly higher as expected at 7.2 by still fairly good considering his overall medical status    Current blood sugar patterns and problems identified:   His fasting blood sugars are fairly consistent now and only sporadically little higher  He has a few readings after lunch, mostly fairly good  More recently has not had many high readings after supper and on average are still excellent over the last 4 weeks despite the holidays  Still not eating more than 2 meals a day usually   no  hypoglycemia at any time   Again he has difficulty losing weight and is not able to do much exercise  His wife is usually helping him watch his diet  Glucose monitoring:  done  Up to 2  times a day        Glucometer:  Accu-Chek    Blood Glucose readings from  Mean values apply above for all meters except median for One Touch  PRE-MEAL Fasting pc Lunch Dinner Bedtime Overall  Glucose range:  88-172  80- 184 ?  104- 223   Mean/median:  120  119   142  127   Hypoglycemia frequency:   None  Self-care: The diet that the patient has been following is: Smaller portions and relatively balanced meals  Meals: 2 meals per day. Noon and 6 pm  Low fat, trying to eat more salads. Avoiding drinks with sugar and no juices       Physical activity: exercise: Minimal, limited by back pain           Dietician visit: Most recent: Unknown.  CDE visit: 11/14               Compliance with the medical regimen: Good     Weight control: This appears to be fluctuating significantly  Wt Readings from Last 3 Encounters:  03/06/15 258 lb 3.2 oz (117.119 kg)  02/24/15 255 lb (115.667 kg)  02/04/15 253 lb (114.76 kg)    Lab Results  Component Value Date   HGBA1C 7.2* 03/03/2015   HGBA1C 6.7* 12/02/2014   HGBA1C 6.6* 09/02/2014   Lab Results  Component Value Date   MICROALBUR 1.6 09/02/2014   LDLCALC 90 09/02/2014   CREATININE 1.66* 03/03/2015       Medication List       This list is accurate as of: 03/06/15 11:59 PM.  Always use your most recent med list.               ACCU-CHEK FASTCLIX LANCETS Misc  USE ONE  TO CHECK BLOOD SUGAR THREE TIMES DAILY     acetaminophen 325 MG tablet  Commonly known as:  TYLENOL  Take 325 mg by mouth every morning.     allopurinol 100 MG tablet  Commonly known as:  ZYLOPRIM  Take 100 mg by mouth daily.     amLODipine 5 MG tablet  Commonly known as:  NORVASC  Take 2.5 mg by mouth daily.     aspirin 81 MG tablet  Take 81 mg by mouth once a week.       atorvastatin 10 MG tablet  Commonly known as:  LIPITOR  TAKE ONE TABLET BY MOUTH ONCE DAILY     carboxymethylcellulose 0.5 % Soln  Commonly known as:  REFRESH PLUS  Place 2 drops into both eyes daily.     carvedilol 6.25 MG tablet  Commonly known as:  COREG  Take 1 tablet (6.25 mg total) by mouth 2 (two) times daily.     chlorthalidone 25 MG tablet  Commonly known as:  HYGROTON  Take 1 tablet daily as needed when weight reaches 246 lbs. or greater only     Cholecalciferol 10000 UNITS Caps  Take 1,000 Units by mouth daily.     colchicine 0.6 MG tablet  Take 0.6 mg by mouth as needed (GOUT).     furosemide 80 MG tablet  Commonly known as:  LASIX  Take 1 tablet (80 mg total) by mouth 2 (two) times daily.     gabapentin 300 MG capsule  Commonly known as:  NEURONTIN  Take 300 mg by mouth 4 (four) times daily. LEG PAIN     glucose blood test strip  Commonly known as:  ACCU-CHEK AVIVA PLUS  Use as instructed to check blood sugar 3 times per day dx code E11.49     insulin detemir 100 unit/ml Soln  Commonly known as:  LEVEMIR  Inject 22 Units into the skin at bedtime.     insulin lispro 100 UNIT/ML KiwkPen  Commonly known as:  HUMALOG KWIKPEN  Inject 0.08-0.12 mLs (8-12 Units total) into the skin 2 (two) times daily.     ipratropium 0.06 % nasal spray  Commonly known as:  ATROVENT  Place 1 spray into both nostrils daily.     losartan 50 MG tablet  Commonly known as:  COZAAR  Take 1 tablet (50 mg total) by mouth daily.     naphazoline-pheniramine 0.025-0.3 % ophthalmic solution  Commonly known as:  NAPHCON-A  Place 1 drop into both eyes daily.     nitroGLYCERIN 0.4 MG SL tablet  Commonly known as:  NITROSTAT  Place 1 tablet (0.4 mg total) under the tongue every 5 (five) minutes as needed for chest pain (MAX 3 TABLETS).     potassium chloride SA 20 MEQ tablet  Commonly known as:  K-DUR,KLOR-CON  Take 20.5 mEq by mouth 2 (two) times daily. TAKE  1.5 TWICE DAILY TO  TOTAL 30 MEQ     warfarin 2.5 MG tablet  Commonly known as:  COUMADIN  TAKE 2.5 MG BY MOUTH DAILY EXCEPT WEDNESDAY AND TAKE 5 MG        Allergies:  Allergies  Allergen Reactions  . Avelox [Moxifloxacin Hcl In Nacl] Swelling, Rash and Other (See Comments)    Patient became hypotensive after infusion started Because of a history of documented adverse serious drug reaction;Medi Alert bracelet  is recommended  . Penicillins Anaphylaxis, Other (See Comments) and Swelling    REACTION: anaphylaxis Because of a history of documented adverse serious drug reaction;Medi Alert bracelet  is recommended  . Quinolones Swelling    Past Medical History  Diagnosis Date  . Diabetes mellitus   . Hyperlipidemia   . Hypertension   . Pilonidal cyst   . Atrial fibrillation ([place])     Previous long-term amiodarone therapy with multiple cardioversions / amiodarone stopped September, 2009  . Atrial flutter [facility])     Started November, 2010, Left-sided and cannot ablate  . Left atrial thrombus [facility])     Remote past... cardioversions done since that time  . Wide-complex tachycardia ([place])   . Left ventricular ejection fraction less than 40%   . Gout   . AAA (abdominal aortic aneurysm) [facility])     Surgical repair  . Discolored skin   . S/P ICD (internal cardiac defibrillator) procedure     Dr. Lovena Le 2009... by the pacing  . SOB (shortness of breath)     Large left effusion/ thoracentesis/hospitalization/November, 2011... exudated.. cytology negative.. Dr.Wert.. no proof of mesothelioma  . Pericardial effusion   . Pleural effusion     Large loculated effusion on the left side November, 2011. This was tapped. It was exudative. Cytology revealed no cancer no proof of mesothelioma area pulmonary team felt that no further workup was needed  . S/P AAA repair   . Spinal stenosis     Surgery Dr.Elsner  . CAD (coronary artery disease)     Catheterization July, 2008... name and vein grafts patent but low  cardiac output  . Warfarin anticoagulation   . Cardiomyopathy     Ischemic... ICD  . Combined systolic and diastolic CHF   . Venous insufficiency     Toe discoloration chronic  . Mitral regurgitation     Mild echo  . Aortic valve sclerosis   . Nasal drainage     Chronic  . Alcohol ingestion of more than four drinks per week     Excess beer  not a dependency problem  . Chronotropic incompetence     IV pacing rate adjusted  . Thrombophlebitis of superficial veins of upper extremities     Possible venous stenosis from defibrillator  . Pericardial effusion     November, 2011 .. decreased during hospitalization  . Eye abnormality     Ophthalmologist questions a clot in one of his eyes, May, 2012  . Overweight(278.02)     November, 2012  . Pleural thickening   . Ejection fraction     Ejection fraction has varied over time from 35-50%.,, Echoes are technically very difficult,,, EF 50%, echo, May 25, 2011, technically very difficult  . Drug therapy     Redness and swelling with Avelox infusion May 24, 2011  . COPD (chronic obstructive pulmonary disease) ([place])   . Myocardial infarction ([place])   . Spinal cord stimulator status     October, 2013  . Carotid artery disease ([place])  Doppler, December, 2013, 0-39% bilateral  . Pneumonia   . Arthritis   . Ventral hernia     April, 2014, result of his abdominal surgery  . Bony abnormality     Patient's manubrium is slightly displaced to the right  . Ascending aortic aneurysm ([place]) 05/10/2014  . Polycythemia vera ([place]) 07/28/2014  . Internal hemorrhoids   . Diverticulosis   . Adenomatous colon polyp     Past Surgical History  Procedure Laterality Date  . Colonoscopy w/ polypectomy    . Lumbar fusion    . Pilonidal cyst removal    . Surgery scrotal / testicular    . Coronary artery bypass graft  2004  . Abdominal aortic aneurysm repair  11/2002  . Bi-ventricular pacemaker insertion (crt-p)  02-11-2013    Pt with previously  implanted MDT CRTD downgraded to CRTP by Dr Lovena Le 02-11-13  . Back surgery    . Incision and drainage abscess / hematoma of bursa / knee / thigh    . Implantable cardioverter defibrillator (icd) generator change N/A 02/11/2013    Procedure: ICD GENERATOR CHANGE;  Surgeon: Evans Lance, MD;  Location: [facility] CATH LAB;  Service: Cardiovascular;  Laterality: N/A;  . Leg surgery Right     Family History  Problem Relation Age of Onset  . Hypertension Mother   . Stroke Mother   . Diabetes Father   . Coronary artery disease Father   . Other Father     DVT  . Colon cancer Neg Hx   . Diabetes Brother   . Heart attack Neg Hx     Social History:  reports that he quit smoking about 19 years ago. His smoking use included Cigarettes. He started smoking about 58 years ago. He has a 117 pack-year smoking history. He has never used smokeless tobacco. He reports that he drinks about 1.2 oz of alcohol per week. He reports that he does not use illicit drugs.    Review of Systems       Lipids: Treated with Lipitor by other physicians, only taking 10 mg Hyperlipidemia: LDL is under 100 but not under 70      Lab Results  Component Value Date   CHOL 152 09/02/2014   HDL 40.00 09/02/2014   LDLCALC 90 09/02/2014   TRIG 112.0 09/02/2014   CHOLHDL 4 09/02/2014       The blood pressure has been under control with current regimen of chlorthalidone, amlodipine and losartan, BP at home usually normal Also he takes extra chlorthalidone when he has weight gain  Renal dysfunction: Creatinine has been quite variable , probably related to his volume status, slightly better recently  Lab Results  Component Value Date   CREATININE 1.66* 03/03/2015   BUN 35* 03/03/2015   NA 142 03/03/2015   K 4.0 03/03/2015   CL 102 03/03/2015   CO2 32 03/03/2015      Has known neuropathy: He has had pains and tingling in his feet and lower legs.  Mostly having some symptoms in day;  some early morning and during the  night.  Takes gabapentin for this and extra tablet at bedtime  Requires phlebotomy for polycythemia periodically   LABS:  Lab on 03/03/2015  Component Date Value Ref Range Status  . Hgb A1c MFr Bld 03/03/2015 7.2* 4.6 - 6.5 % Final   Glycemic Control Guidelines for People with Diabetes:Non Diabetic:  <6%Goal of Therapy: <7%Additional Action Suggested:  >8%   . Sodium 03/03/2015 142  135 -  145 mEq/L Final  . Potassium 03/03/2015 4.0  3.5 - 5.1 mEq/L Final  . Chloride 03/03/2015 102  96 - 112 mEq/L Final  . CO2 03/03/2015 32  19 - 32 mEq/L Final  . Glucose, Bld 03/03/2015 144* 70 - 99 mg/dL Final  . BUN 03/03/2015 35* 6 - 23 mg/dL Final  . Creatinine, Ser 03/03/2015 1.66* 0.40 - 1.50 mg/dL Final  . Total Bilirubin 03/03/2015 0.8  0.2 - 1.2 mg/dL Final  . Alkaline Phosphatase 03/03/2015 54  39 - 117 U/L Final  . AST 03/03/2015 11  0 - 37 U/L Final  . ALT 03/03/2015 11  0 - 53 U/L Final  . Total Protein 03/03/2015 6.3  6.0 - 8.3 g/dL Final  . Albumin 03/03/2015 3.9  3.5 - 5.2 g/dL Final  . Calcium 03/03/2015 9.2  8.4 - 10.5 mg/dL Final  . GFR 03/03/2015 43.27* >60.00 mL/min Final    Physical Examination:  BP 128/84 mmHg  Pulse 79  Temp(Src) 98.7 F (37.1 C)  Resp 16  Ht 6' (1.829 m)  Wt 258 lb 3.2 oz (117.119 kg)  BMI 35.01 kg/m2  SpO2 96%     ASSESSMENT/PLAN:  Diabetes type 2  His blood sugar control is somewhat variable since his last visit because of getting steroids and occasional inconsistent diet As expected A1c is slightly higher at 7.2 See history of present illness for detailed discussion of his current management, blood sugar patterns and problems identified He is on basal bolus insulin regimen and is very compliant with his instructions on glucose monitoring and insulin doses   He appears to be requiring fairly stable but relatively small doses of insulin even with not taking metformin concurrently.    For now he will continue the same regimen, may check  fasting readings less than frequently   Danuta Huseman 03/07/2015, 4:28 PM

## 2015-04-01 ENCOUNTER — Ambulatory Visit (INDEPENDENT_AMBULATORY_CARE_PROVIDER_SITE_OTHER): Payer: Medicare Other | Admitting: Gastroenterology

## 2015-04-01 ENCOUNTER — Ambulatory Visit: Payer: Medicare Other | Admitting: Gastroenterology

## 2015-04-01 ENCOUNTER — Encounter: Payer: Self-pay | Admitting: Gastroenterology

## 2015-04-01 ENCOUNTER — Telehealth: Payer: Self-pay

## 2015-04-01 VITALS — BP 120/64 | HR 60 | Ht 72.0 in | Wt 254.6 lb

## 2015-04-01 DIAGNOSIS — Z7901 Long term (current) use of anticoagulants: Secondary | ICD-10-CM

## 2015-04-01 DIAGNOSIS — Z86010 Personal history of colonic polyps: Secondary | ICD-10-CM | POA: Diagnosis not present

## 2015-04-01 MED ORDER — NA SULFATE-K SULFATE-MG SULF 17.5-3.13-1.6 GM/177ML PO SOLN: 1.0000 | Freq: Once | ORAL | Status: DC

## 2015-04-01 NOTE — Progress Notes (Signed)
Review of pertinent gastrointestinal problems: 1. Adenomatous colon polyps. Dr. Olevia Perches colonsocopy 2011 2 small TAs. Colonoscopy Dr. Ardis Hughs 09/2014 Six polyps were found, removed and sent to pathology; The largest was heaped up, soft, 3cm across, located in proximal transverse segment, removed in piecemeal fashion.  Path showed the largest was TVA (no HGD) and the rest were TAs.  HPI: This is a  very pleasant 75 year old man whom I last saw about 6 months ago  Chief complaint is history of colon polyps, current chronic blood thinner use   He is here to discuss repeat colonoscopy for polyp surveillance, C colonoscopies above. He is still on Coumadin for chronic atrial fibrillation. He has had no GI symptoms and     Past Medical History  Diagnosis Date  . Diabetes mellitus   . Hyperlipidemia   . Hypertension   . Pilonidal cyst   . Atrial fibrillation ([place])     Previous long-term amiodarone therapy with multiple cardioversions / amiodarone stopped September, 2009  . Atrial flutter [facility])     Started November, 2010, Left-sided and cannot ablate  . Left atrial thrombus [facility])     Remote past... cardioversions done since that time  . Wide-complex tachycardia ([place])   . Left ventricular ejection fraction less than 40%   . Gout   . AAA (abdominal aortic aneurysm) [facility])     Surgical repair  . Discolored skin   . S/P ICD (internal cardiac defibrillator) procedure     Dr. Lovena Le 2009... by the pacing  . SOB (shortness of breath)     Large left effusion/ thoracentesis/hospitalization/November, 2011... exudated.. cytology negative.. Dr.Wert.. no proof of mesothelioma  . Pericardial effusion   . Pleural effusion     Large loculated effusion on the left side November, 2011. This was tapped. It was exudative. Cytology revealed no cancer no proof of mesothelioma area pulmonary team felt that no further workup was needed  . S/P AAA repair   . Spinal stenosis     Surgery Dr.Elsner  . CAD (coronary  artery disease)     Catheterization July, 2008... name and vein grafts patent but low cardiac output  . Warfarin anticoagulation   . Cardiomyopathy     Ischemic... ICD  . Combined systolic and diastolic CHF   . Venous insufficiency     Toe discoloration chronic  . Mitral regurgitation     Mild echo  . Aortic valve sclerosis   . Nasal drainage     Chronic  . Alcohol ingestion of more than four drinks per week     Excess beer  not a dependency problem  . Chronotropic incompetence     IV pacing rate adjusted  . Thrombophlebitis of superficial veins of upper extremities     Possible venous stenosis from defibrillator  . Pericardial effusion     November, 2011 .. decreased during hospitalization  . Eye abnormality     Ophthalmologist questions a clot in one of his eyes, May, 2012  . Overweight(278.02)     November, 2012  . Pleural thickening   . Ejection fraction     Ejection fraction has varied over time from 35-50%.,, Echoes are technically very difficult,,, EF 50%, echo, May 25, 2011, technically very difficult  . Drug therapy     Redness and swelling with Avelox infusion May 24, 2011  . COPD (chronic obstructive pulmonary disease) ([place])   . Myocardial infarction ([place])   . Spinal cord stimulator status     October, 2013  .  Carotid artery disease ([place])     Doppler, December, 2013, 0-39% bilateral  . Pneumonia   . Arthritis   . Ventral hernia     April, 2014, result of his abdominal surgery  . Bony abnormality     Patient's manubrium is slightly displaced to the right  . Ascending aortic aneurysm ([place]) 05/10/2014  . Polycythemia vera ([place]) 07/28/2014  . Internal hemorrhoids   . Diverticulosis   . Adenomatous colon polyp     Past Surgical History  Procedure Laterality Date  . Colonoscopy w/ polypectomy    . Lumbar fusion    . Pilonidal cyst removal    . Surgery scrotal / testicular    . Coronary artery bypass graft  2004  . Abdominal aortic aneurysm repair  11/2002   . Bi-ventricular pacemaker insertion (crt-p)  02-11-2013    Pt with previously implanted MDT CRTD downgraded to CRTP by Dr Lovena Le 02-11-13  . Back surgery    . Incision and drainage abscess / hematoma of bursa / knee / thigh    . Implantable cardioverter defibrillator (icd) generator change N/A 02/11/2013    Procedure: ICD GENERATOR CHANGE;  Surgeon: Evans Lance, MD;  Location: [facility] CATH LAB;  Service: Cardiovascular;  Laterality: N/A;  . Leg surgery Right     Current Outpatient Prescriptions  Medication Sig Dispense Refill  . ACCU-CHEK FASTCLIX LANCETS MISC USE ONE  TO CHECK BLOOD SUGAR THREE TIMES DAILY 102 each 5  . acetaminophen (TYLENOL) 325 MG tablet Take 325 mg by mouth every morning.     Marland Kitchen allopurinol (ZYLOPRIM) 100 MG tablet Take 100 mg by mouth daily.    Marland Kitchen amLODipine (NORVASC) 5 MG tablet Take 2.5 mg by mouth daily.    Marland Kitchen aspirin 81 MG tablet Take 81 mg by mouth once a week.     Marland Kitchen atorvastatin (LIPITOR) 10 MG tablet TAKE ONE TABLET BY MOUTH ONCE DAILY 90 tablet 2  . carboxymethylcellulose (REFRESH PLUS) 0.5 % SOLN Place 2 drops into both eyes daily.     . carvedilol (COREG) 6.25 MG tablet Take 1 tablet (6.25 mg total) by mouth 2 (two) times daily. 180 tablet 1  . chlorthalidone (HYGROTON) 25 MG tablet Take 1 tablet daily as needed when weight reaches 246 lbs. or greater only (Patient taking differently: Take 25 mg by mouth daily as needed (when weight reaches 246 lbs or greater only). ) 45 tablet 3  . Cholecalciferol 10000 UNITS CAPS Take 1,000 Units by mouth daily.    . colchicine 0.6 MG tablet Take 0.6 mg by mouth as needed (GOUT).    . furosemide (LASIX) 80 MG tablet Take 1 tablet (80 mg total) by mouth 2 (two) times daily. 180 tablet 3  . gabapentin (NEURONTIN) 300 MG capsule Take 300 mg by mouth 4 (four) times daily. LEG PAIN    . glucose blood (ACCU-CHEK AVIVA PLUS) test strip Use as instructed to check blood sugar 3 times per day dx code E11.49 100 each 5  . insulin  detemir (LEVEMIR) 100 unit/ml SOLN Inject 22 Units into the skin at bedtime.    . insulin lispro (HUMALOG KWIKPEN) 100 UNIT/ML KiwkPen Inject 0.08-0.12 mLs (8-12 Units total) into the skin 2 (two) times daily. (Patient taking differently: Inject 8-10 Units into the skin 2 (two) times daily. ) 5 pen 3  . ipratropium (ATROVENT) 0.06 % nasal spray Place 1 spray into both nostrils daily.     Marland Kitchen losartan (COZAAR) 50 MG tablet Take 1 tablet (  50 mg total) by mouth daily. 90 tablet 3  . naphazoline-pheniramine (NAPHCON-A) 0.025-0.3 % ophthalmic solution Place 1 drop into both eyes daily.     . nitroGLYCERIN (NITROSTAT) 0.4 MG SL tablet Place 1 tablet (0.4 mg total) under the tongue every 5 (five) minutes as needed for chest pain (MAX 3 TABLETS). 25 tablet 3  . potassium chloride SA (K-DUR,KLOR-CON) 20 MEQ tablet Take 30 mEq by mouth 2 (two) times daily. TAKE 1.5 TWICE DAILY TO TOTAL 30 MEQ    . warfarin (COUMADIN) 2.5 MG tablet TAKE 2.5 MG BY MOUTH DAILY EXCEPT WEDNESDAY AND TAKE 5 MG 130 tablet 0   No current facility-administered medications for this visit.    Allergies as of 04/01/2015 - Review Complete 04/01/2015  Allergen Reaction Noted  . Avelox [moxifloxacin hcl in nacl] Swelling, Rash, and Other (See Comments) 05/24/2011  . Penicillins Anaphylaxis, Other (See Comments), and Swelling 01/27/2010  . Quinolones Swelling 02/24/2015    Family History  Problem Relation Age of Onset  . Hypertension Mother   . Stroke Mother   . Diabetes Father   . Coronary artery disease Father   . Other Father     DVT  . Colon cancer Neg Hx   . Diabetes Brother   . Heart attack Neg Hx     Social History   Social History  . Marital Status: Married    Spouse Name: N/A  . Number of Children: N/A  . Years of Education: N/A   Occupational History  . Retired- Nurse, mental health    Social History Main Topics  . Smoking status: Former Smoker -- 3.00 packs/day for 39 years    Types: Cigarettes     Start date: 05/24/1956    Quit date: 03/22/1995  . Smokeless tobacco: Never Used     Comment: quit smoking 18 years ago  . Alcohol Use: 1.2 oz/week    2 Cans of beer per week     Comment: beer  . Drug Use: No  . Sexual Activity: No   Other Topics Concern  . Not on file   Social History Narrative     Physical Exam: BP 120/64 mmHg  Pulse 60  Ht 6' (1.829 m)  Wt 254 lb 9.6 oz (115.486 kg)  BMI 34.52 kg/m2 Constitutional: generally well-appearing Psychiatric: alert and oriented x3 Abdomen: soft, nontender, nondistended, no obvious ascites, no peritoneal signs, normal bowel sounds   Assessment and plan: 75 y.o. male with piecemeal resected polyp July 2016, chronic blood thinner use  We will go ahead and schedule repeat colonoscopy at his soonest convenience. We will get in touch with his cardiologist to make sure he agrees that it is safe that he hold his Coumadin again for 5 days prior to the colonoscopy as he has done in the past. I see no reason for any further blood tests or imaging studies prior to then.   Owens Loffler, MD [facility] 04/01/2015, 11:06 AM

## 2015-04-01 NOTE — Patient Instructions (Signed)
You will be set up for a colonoscopy for polyp surveillance. We will communicate with your cardiologist about the safety of holding your coumadin for 5 days prior.

## 2015-04-01 NOTE — Telephone Encounter (Signed)
04/01/2015   RE: DEWAYNE KOK DOB: [birth year] MRN: [id number]   Dear Dr Lovena Le,    We have scheduled the above patient for an endoscopic procedure. Our records show that he is on anticoagulation therapy.   Please advise as to how long the patient may come off his therapy of coumadin prior to the procedure, which is scheduled for 05/19/15.    Sincerely,    Christian Mate CMA(AAMA)

## 2015-04-02 ENCOUNTER — Ambulatory Visit (INDEPENDENT_AMBULATORY_CARE_PROVIDER_SITE_OTHER): Payer: Medicare Other | Admitting: *Deleted

## 2015-04-02 DIAGNOSIS — Z7901 Long term (current) use of anticoagulants: Secondary | ICD-10-CM

## 2015-04-02 DIAGNOSIS — I4891 Unspecified atrial fibrillation: Secondary | ICD-10-CM

## 2015-04-02 LAB — POCT INR: INR: 2.4

## 2015-04-03 NOTE — Telephone Encounter (Signed)
Patty, FYI 

## 2015-04-03 NOTE — Telephone Encounter (Signed)
Pt has been notified to hold coumadin 5 days prior to procedure

## 2015-04-03 NOTE — Telephone Encounter (Signed)
-----   Message from Volanda Napoleon, MD sent at 04/03/2015 11:26 AM EST ----- Megan:  No need to bridge with Lovenox.  Phlebotomy takes care of the "thickness" of his blood!!  I will pray for him!! Laurey Arrow ----- Message -----    From: Leeroy Bock, [facility]    Sent: 04/03/2015  11:02 AM      To: Volanda Napoleon, MD  Dr. Marin Olp,  Mr. Odoms is a mutual patient of ours who we see in Coumadin clinic. He has an upcoming procedure and will need to be off his Coumadin for 5 days. From a cardiac perspective, he would not need bridging with Lovenox. However, you recently saw him for polycythemia. In your opinion, how hypercoagulable will this make him, and do you recommend that we bridge Mr. Larussa with Lovenox?  Thanks, Fuller Canada, PharmD

## 2015-04-03 NOTE — Telephone Encounter (Signed)
Patient can hold Coumadin x5 days prior to procedure. Cardiac-wise he does not need Lovenox bridging (CHADS2 score of 3, remote history of DVT). Confirmed with his oncologist that his polycythemia does not make him hypercoagulable to the point that he would need Lovenox - however phlebotomizing him regulates this. Pt ok to proceed with procedure with no Lovenox bridge. Hold Coumadin February 23 - 27, plan to restart on February 28 in PM after procedure.

## 2015-04-15 ENCOUNTER — Ambulatory Visit (INDEPENDENT_AMBULATORY_CARE_PROVIDER_SITE_OTHER): Payer: Medicare Other | Admitting: *Deleted

## 2015-04-15 DIAGNOSIS — I255 Ischemic cardiomyopathy: Secondary | ICD-10-CM | POA: Diagnosis not present

## 2015-04-15 DIAGNOSIS — I5022 Chronic systolic (congestive) heart failure: Secondary | ICD-10-CM | POA: Diagnosis not present

## 2015-04-15 NOTE — Progress Notes (Signed)
Remote ICD transmission.   

## 2015-04-20 ENCOUNTER — Telehealth: Payer: Self-pay | Admitting: Internal Medicine

## 2015-04-20 NOTE — Telephone Encounter (Signed)
Follow Up     4. Are you calling to see if we received your device transmission? YES  1/25 remote. They said they sent it. Pleas call

## 2015-04-21 ENCOUNTER — Other Ambulatory Visit: Payer: Self-pay | Admitting: Endocrinology

## 2015-04-21 ENCOUNTER — Other Ambulatory Visit: Payer: Self-pay | Admitting: Cardiology

## 2015-04-21 NOTE — Telephone Encounter (Signed)
Spoke w/ pt wife and informed her that we did receive remote transmission. Pt wife said she received a mychart message indicating the transmission was not received. Spoke w/ the front desk and had pt re arrived for that day.

## 2015-04-23 ENCOUNTER — Telehealth: Payer: Self-pay | Admitting: Interventional Cardiology

## 2015-04-23 ENCOUNTER — Other Ambulatory Visit: Payer: Self-pay | Admitting: Endocrinology

## 2015-04-23 NOTE — Telephone Encounter (Signed)
New problem   Pt's wife need for you to give her a call concerning pt but wouldn't state why.

## 2015-04-23 NOTE — Telephone Encounter (Signed)
**Note De-identified Tamber Burtch Obfuscation** Phone is busy. Will continue to call. 

## 2015-04-23 NOTE — Telephone Encounter (Signed)
Amlodipine RX has been faxed to [facility] per the pts wife request.

## 2015-04-29 LAB — CUP PACEART REMOTE DEVICE CHECK
Battery Voltage: 2.98 V
Brady Statistic AP VP Percent: 0 %
Brady Statistic RV Percent Paced: 19.84 %
Date Time Interrogation Session: 20170125051603
HIGH POWER IMPEDANCE MEASURED VALUE: 86 Ohm
Implantable Lead Implant Date: 20090121
Implantable Lead Implant Date: 20090121
Implantable Lead Location: 753859
Implantable Lead Model: 4193
Implantable Lead Model: 6935
Lead Channel Impedance Value: 4047 Ohm
Lead Channel Pacing Threshold Amplitude: 0.75 V
Lead Channel Pacing Threshold Pulse Width: 0.4 ms
Lead Channel Sensing Intrinsic Amplitude: 1.125 mV
Lead Channel Sensing Intrinsic Amplitude: 15.625 mV
Lead Channel Setting Pacing Pulse Width: 0.4 ms
MDC IDC LEAD IMPLANT DT: 20090121
MDC IDC LEAD LOCATION: 753858
MDC IDC LEAD LOCATION: 753860
MDC IDC MSMT BATTERY REMAINING LONGEVITY: 94 mo
MDC IDC MSMT LEADCHNL LV IMPEDANCE VALUE: 4047 Ohm
MDC IDC MSMT LEADCHNL LV IMPEDANCE VALUE: 570 Ohm
MDC IDC MSMT LEADCHNL RA IMPEDANCE VALUE: 513 Ohm
MDC IDC MSMT LEADCHNL RA SENSING INTR AMPL: 1.125 mV
MDC IDC MSMT LEADCHNL RV IMPEDANCE VALUE: 399 Ohm
MDC IDC MSMT LEADCHNL RV IMPEDANCE VALUE: 494 Ohm
MDC IDC MSMT LEADCHNL RV SENSING INTR AMPL: 15.625 mV
MDC IDC SET LEADCHNL RV PACING AMPLITUDE: 2.5 V
MDC IDC SET LEADCHNL RV SENSING SENSITIVITY: 0.3 mV
MDC IDC STAT BRADY AP VS PERCENT: 0 %
MDC IDC STAT BRADY AS VP PERCENT: 15.1 %
MDC IDC STAT BRADY AS VS PERCENT: 84.9 %
MDC IDC STAT BRADY RA PERCENT PACED: 0 %

## 2015-05-01 ENCOUNTER — Encounter: Payer: Self-pay | Admitting: Cardiology

## 2015-05-11 ENCOUNTER — Other Ambulatory Visit: Payer: Self-pay | Admitting: Internal Medicine

## 2015-05-12 ENCOUNTER — Other Ambulatory Visit: Payer: Medicare Other

## 2015-05-12 ENCOUNTER — Ambulatory Visit: Payer: Medicare Other | Admitting: Hematology & Oncology

## 2015-05-14 ENCOUNTER — Other Ambulatory Visit (HOSPITAL_BASED_OUTPATIENT_CLINIC_OR_DEPARTMENT_OTHER): Payer: Medicare Other

## 2015-05-14 ENCOUNTER — Ambulatory Visit (HOSPITAL_BASED_OUTPATIENT_CLINIC_OR_DEPARTMENT_OTHER): Payer: Medicare Other

## 2015-05-14 ENCOUNTER — Encounter: Payer: Self-pay | Admitting: Hematology & Oncology

## 2015-05-14 ENCOUNTER — Ambulatory Visit (HOSPITAL_BASED_OUTPATIENT_CLINIC_OR_DEPARTMENT_OTHER): Payer: Medicare Other | Admitting: Hematology & Oncology

## 2015-05-14 VITALS — BP 122/65 | HR 122 | Temp 97.7°F | Resp 16 | Ht 72.0 in | Wt 252.0 lb

## 2015-05-14 VITALS — BP 100/58 | HR 51

## 2015-05-14 DIAGNOSIS — D582 Other hemoglobinopathies: Secondary | ICD-10-CM

## 2015-05-14 DIAGNOSIS — D45 Polycythemia vera: Secondary | ICD-10-CM

## 2015-05-14 LAB — COMPREHENSIVE METABOLIC PANEL
ALT: 14 U/L (ref 0–55)
ANION GAP: 13 meq/L — AB (ref 3–11)
AST: 13 U/L (ref 5–34)
Albumin: 3.4 g/dL — ABNORMAL LOW (ref 3.5–5.0)
Alkaline Phosphatase: 65 U/L (ref 40–150)
BILIRUBIN TOTAL: 0.95 mg/dL (ref 0.20–1.20)
BUN: 46.9 mg/dL — ABNORMAL HIGH (ref 7.0–26.0)
CHLORIDE: 97 meq/L — AB (ref 98–109)
CO2: 31 meq/L — AB (ref 22–29)
Calcium: 9.3 mg/dL (ref 8.4–10.4)
Creatinine: 2.2 mg/dL — ABNORMAL HIGH (ref 0.7–1.3)
EGFR: 28 mL/min/{1.73_m2} — AB (ref >90)
Glucose: 289 mg/dl — ABNORMAL HIGH (ref 70–140)
POTASSIUM: 4.3 meq/L (ref 3.5–5.1)
Sodium: 141 mEq/L (ref 136–145)
TOTAL PROTEIN: 6.6 g/dL (ref 6.4–8.3)

## 2015-05-14 LAB — CBC WITH DIFFERENTIAL (CANCER CENTER ONLY)
BASO#: 0 10*3/uL (ref 0.0–0.2)
BASO%: 0.4 % (ref 0.0–2.0)
EOS ABS: 0.1 10*3/uL (ref 0.0–0.5)
EOS%: 0.7 % (ref 0.0–7.0)
HCT: 50.8 % — ABNORMAL HIGH (ref 38.7–49.9)
HGB: 17.1 g/dL (ref 13.0–17.1)
LYMPH#: 1.2 10*3/uL (ref 0.9–3.3)
LYMPH%: 16.8 % (ref 14.0–48.0)
MCH: 30.6 pg (ref 28.0–33.4)
MCHC: 33.7 g/dL (ref 32.0–35.9)
MCV: 91 fL (ref 82–98)
MONO#: 0.6 10*3/uL (ref 0.1–0.9)
MONO%: 9 % (ref 0.0–13.0)
NEUT#: 5 10*3/uL (ref 1.5–6.5)
NEUT%: 73.1 % (ref 40.0–80.0)
PLATELETS: 152 10*3/uL (ref 145–400)
RBC: 5.59 10*6/uL (ref 4.20–5.70)
RDW: 15.1 % (ref 11.1–15.7)
WBC: 6.9 10*3/uL (ref 4.0–10.0)

## 2015-05-14 NOTE — Progress Notes (Signed)
[name] presents today for phlebotomy per MD orders. Phlebotomy procedure started at 1420 and ended at 1427. 500 ml removed. Patient observed for 30 minutes after procedure without any incident. Patient tolerated procedure well. IV needle removed intact.

## 2015-05-14 NOTE — Progress Notes (Signed)
Hematology and Oncology Follow Up Visit  [name] [id number] [birth year] 75 y.o. 05/14/2015   Principle Diagnosis:   Polycythemia vera  Current Therapy:    Phlebotomy to maintain hematocrit below 45%.       Aspirin 81 mg by mouth weekly       Coumadin for chronic atrial fibrillation      Interim History:  [name] is back for follow-up.he does feel a little more tired. I suspect his blood probably is on the higher side.  He's had no obvious bleeding. He is on Coumadin.  He is still having back issues. He has had a myelogram. He has had some epidural steroids.  He and his wife will be going to the beach in a couple weeks. The house down there. They really enjoy going down.  Last time we saw him in November, his ferritin was 62 with an iron saturation of 41%.   He's had no chest pain. He's had no cough or shortness of breath.  He's had no weight loss or weight gain. He's had no leg swelling. His been no change in his medications.  Overall, his performance status is ECOG 1-2.   Medications:  Current outpatient prescriptions:  .  ACCU-CHEK FASTCLIX LANCETS MISC, USE ONE  TO CHECK BLOOD SUGAR THREE TIMES DAILY, Disp: 102 each, Rfl: 5 .  acetaminophen (TYLENOL) 325 MG tablet, Take 325 mg by mouth every morning. , Disp: , Rfl:  .  allopurinol (ZYLOPRIM) 100 MG tablet, Take 100 mg by mouth daily., Disp: , Rfl:  .  amLODipine (NORVASC) 5 MG tablet, Take 2.5 mg by mouth daily., Disp: , Rfl:  .  aspirin 81 MG tablet, Take 81 mg by mouth once a week. , Disp: , Rfl:  .  atorvastatin (LIPITOR) 10 MG tablet, TAKE ONE TABLET BY MOUTH ONCE DAILY, Disp: 90 tablet, Rfl: 2 .  carboxymethylcellulose (REFRESH PLUS) 0.5 % SOLN, Place 2 drops into both eyes daily. , Disp: , Rfl:  .  carvedilol (COREG) 6.25 MG tablet, Take 1 tablet (6.25 mg total) by mouth 2 (two) times daily., Disp: 180 tablet, Rfl: 1 .  chlorthalidone (HYGROTON) 25 MG tablet, Take 1 tablet daily as needed when  weight reaches 246 lbs. or greater only (Patient taking differently: Take 25 mg by mouth daily as needed (when weight reaches 246 lbs or greater only). ), Disp: 45 tablet, Rfl: 3 .  Cholecalciferol 10000 UNITS CAPS, Take 1,000 Units by mouth daily., Disp: , Rfl:  .  colchicine 0.6 MG tablet, Take 1 tablet (0.6 mg total) by mouth daily., Disp: 10 tablet, Rfl: 0 .  colchicine 0.6 MG tablet, Take 0.6 mg by mouth as needed (GOUT)., Disp: , Rfl:  .  colchicine 0.6 MG tablet, Take one tablet by mouth once daily as needed for Gout. --Must have appt with new PCP for further refills, Disp: 30 tablet, Rfl: 0 .  furosemide (LASIX) 80 MG tablet, Take 1 tablet (80 mg total) by mouth 2 (two) times daily., Disp: 180 tablet, Rfl: 3 .  gabapentin (NEURONTIN) 300 MG capsule, Take 300 mg by mouth 4 (four) times daily. LEG PAIN, Disp: , Rfl:  .  glucose blood (ACCU-CHEK AVIVA PLUS) test strip, Use as instructed to check blood sugar 3 times per day dx code E11.49, Disp: 100 each, Rfl: 5 .  insulin detemir (LEVEMIR) 100 unit/ml SOLN, Inject 22 Units into the skin at bedtime., Disp: , Rfl:  .  insulin lispro (HUMALOG KWIKPEN) 100  UNIT/ML KiwkPen, Inject 0.08-0.12 mLs (8-12 Units total) into the skin 2 (two) times daily. (Patient taking differently: Inject 8-10 Units into the skin 2 (two) times daily. ), Disp: 5 pen, Rfl: 3 .  ipratropium (ATROVENT) 0.06 % nasal spray, Place 1 spray into both nostrils daily. , Disp: , Rfl:  .  LEVEMIR FLEXTOUCH 100 UNIT/ML Pen, INJECT 35 UNITS SUBCUTANEOUSLY AT BEDTIME, Disp: 45 mL, Rfl: 3 .  losartan (COZAAR) 50 MG tablet, Take 1 tablet (50 mg total) by mouth daily., Disp: 90 tablet, Rfl: 3 .  Na Sulfate-K Sulfate-Mg Sulf SOLN, Take 1 kit by mouth once., Disp: 354 mL, Rfl: 0 .  naphazoline-pheniramine (NAPHCON-A) 0.025-0.3 % ophthalmic solution, Place 1 drop into both eyes daily. , Disp: , Rfl:  .  nitroGLYCERIN (NITROSTAT) 0.4 MG SL tablet, Place 1 tablet (0.4 mg total) under the tongue  every 5 (five) minutes as needed for chest pain (MAX 3 TABLETS)., Disp: 25 tablet, Rfl: 3 .  potassium chloride SA (K-DUR,KLOR-CON) 20 MEQ tablet, Take 30 mEq by mouth 2 (two) times daily. TAKE 1.5 TWICE DAILY TO TOTAL 30 MEQ, Disp: , Rfl:  .  warfarin (COUMADIN) 2.5 MG tablet, TAKE 2.5 MG BY MOUTH DAILY EXCEPT WEDNESDAY AND TAKE 5 MG, Disp: 130 tablet, Rfl: 0  Allergies:  Allergies  Allergen Reactions  . Avelox [Moxifloxacin Hcl In Nacl] Swelling, Rash and Other (See Comments)    Patient became hypotensive after infusion started Because of a history of documented adverse serious drug reaction;Medi Alert bracelet  is recommended  . Penicillins Anaphylaxis, Other (See Comments) and Swelling    REACTION: anaphylaxis Because of a history of documented adverse serious drug reaction;Medi Alert bracelet  is recommended  . Quinolones Swelling    Past Medical History, Surgical history, Social history, and Family History were reviewed and updated.  Review of Systems: As above  Physical Exam:  height is 6' (1.829 m) and weight is 252 lb (114.306 kg). His oral temperature is 97.7 F (36.5 C). His blood pressure is 122/65 and his pulse is 122. His respiration is 16.   Well-developed and well-nourished white [name]. He is moderately obese. Head and neck exam shows no ocular or oral lesions. There is less conjunctival inflammation. There is no adenopathy in his neck. Lungs are clear. He has no rales, wheezes or rhonchi. Cardiac exam irregular rate and rhythm consistent with atrial fibrillation, he has  no murmurs, rubs or bruits. Abdomen is soft. He has good bowel sounds. He is somewhat obese. There is no palpable liver or spleen tip. Back exam no tenderness over the spine, ribs or hips. Extremities shows some chronic mild nonpitting edema of the legs. Skin exam no rashes. He still has a little bit of a ruddy complexion. Neurological exam is nonfocal.  Lab Results  Component Value Date   WBC 6.9  05/14/2015   HGB 17.1 05/14/2015   HCT 50.8* 05/14/2015   MCV 91 05/14/2015   PLT 152 05/14/2015     Chemistry      Component Value Date/Time   NA 142 03/03/2015 1039   NA 141 12/03/2014 1345   NA 144 04/22/2014 1350   K 4.0 03/03/2015 1039   K 3.8 12/03/2014 1345   K 3.9 04/22/2014 1350   CL 102 03/03/2015 1039   CL 100 04/22/2014 1350   CO2 32 03/03/2015 1039   CO2 29 12/03/2014 1345   CO2 29 04/22/2014 1350   BUN 35* 03/03/2015 1039   BUN 49.5* 12/03/2014  1345   BUN 30* 04/22/2014 1350   CREATININE 1.66* 03/03/2015 1039   CREATININE 2.0* 12/03/2014 1345   CREATININE 1.6* 04/22/2014 1350      Component Value Date/Time   CALCIUM 9.2 03/03/2015 1039   CALCIUM 9.6 12/03/2014 1345   CALCIUM 9.1 04/22/2014 1350   ALKPHOS 54 03/03/2015 1039   ALKPHOS 55 04/22/2014 1350   AST 11 03/03/2015 1039   AST 16 04/22/2014 1350   ALT 11 03/03/2015 1039   ALT 15 04/22/2014 1350   BILITOT 0.8 03/03/2015 1039   BILITOT 1.00 04/22/2014 1350         Impression and Plan: Mr. [name] is 75 year old male with polycythemia.  I believe that he will need a phlebotomy. His hematocrit is almost 51%.  He was feels better after getting a phlebotomy.  We will plan to get him back probably in about 2 months. I suspect he probably will need a phlebotomy then.  Of note, his wife might have recurrent melanoma. So far, studies have not any conclusive which is encouraging.  Volanda Napoleon, MD 2/23/20172:11 PM

## 2015-05-14 NOTE — Patient Instructions (Signed)
Therapeutic Phlebotomy, Care After  Refer to this sheet in the next few weeks. These instructions provide you with information about caring for yourself after your procedure. Your health care provider may also give you more specific instructions. Your treatment has been planned according to current medical practices, but problems sometimes occur. Call your health care provider if you have any problems or questions after your procedure.  WHAT TO EXPECT AFTER THE PROCEDURE  After your procedure, it is common to have:   Light-headedness or dizziness. You may feel faint.   Nausea.   Tiredness.  HOME CARE INSTRUCTIONS  Activities   Return to your normal activities as directed by your health care provider. Most people can go back to their normal activities right away.   Avoid strenuous physical activity and heavy lifting or pulling for about 5 hours after the procedure. Do not lift anything that is heavier than 10 lb (4.5 kg).   Athletes should avoid strenuous exercise for at least 12 hours.   Change positions slowly for the remainder of the day. This will help to prevent light-headedness or fainting.   If you feel light-headed, lie down until the feeling goes away.  Eating and Drinking   Be sure to eat well-balanced meals for the next 24 hours.   Drink enough fluid to keep your urine clear or pale yellow.   Avoid drinking alcohol on the day that you had the procedure.  Care of the Needle Insertion Site   Keep your bandage dry. You can remove the bandage after about 5 hours or as directed by your health care provider.   If you have bleeding from the needle insertion site, elevate your arm and press firmly on the site until the bleeding stops.   If you have bruising at the site, apply ice to the area:   Put ice in a plastic bag.   Place a towel between your skin and the bag.   Leave the ice on for 20 minutes, 2-3 times a day for the first 24 hours.   If the swelling does not go away after 24 hours, apply  a warm, moist washcloth to the area for 20 minutes, 2-3 times a day.  General Instructions   Avoid smoking for at least 30 minutes after the procedure.   Keep all follow-up visits as directed by your health care provider. It is important to continue with further therapeutic phlebotomy treatments as directed.  SEEK MEDICAL CARE IF:   You have redness, swelling, or pain at the needle insertion site.   You have fluid, blood, or pus coming from the needle insertion site.   You feel light-headed, dizzy, or nauseated, and the feeling does not go away.   You notice new bruising at the needle insertion site.   You feel weaker than normal.   You have a fever or chills.  SEEK IMMEDIATE MEDICAL CARE IF:   You have severe nausea or vomiting.   You have chest pain.   You have trouble breathing.    This information is not intended to replace advice given to you by your health care provider. Make sure you discuss any questions you have with your health care provider.    Document Released: 08/09/2010 Document Revised: 07/22/2014 Document Reviewed: 03/03/2014  Elsevier Interactive Patient Education 2016 [employer]

## 2015-05-15 LAB — FERRITIN: FERRITIN: 94 ng/mL (ref 22–316)

## 2015-05-15 LAB — IRON AND TIBC
%SAT: 38 % (ref 20–55)
Iron: 135 ug/dL (ref 42–163)
TIBC: 355 ug/dL (ref 202–409)
UIBC: 220 ug/dL (ref 117–376)

## 2015-05-19 ENCOUNTER — Encounter: Payer: Self-pay | Admitting: Gastroenterology

## 2015-05-19 ENCOUNTER — Ambulatory Visit (AMBULATORY_SURGERY_CENTER): Payer: Medicare Other | Admitting: Gastroenterology

## 2015-05-19 VITALS — BP 116/65 | HR 64 | Temp 97.1°F | Resp 13 | Ht 72.0 in | Wt 254.0 lb

## 2015-05-19 DIAGNOSIS — Z86010 Personal history of colonic polyps: Secondary | ICD-10-CM | POA: Diagnosis not present

## 2015-05-19 DIAGNOSIS — D122 Benign neoplasm of ascending colon: Secondary | ICD-10-CM | POA: Diagnosis not present

## 2015-05-19 DIAGNOSIS — D120 Benign neoplasm of cecum: Secondary | ICD-10-CM

## 2015-05-19 LAB — GLUCOSE, CAPILLARY
GLUCOSE-CAPILLARY: 184 mg/dL — AB (ref 65–99)
GLUCOSE-CAPILLARY: 163 mg/dL — AB (ref 65–99)

## 2015-05-19 MED ORDER — SODIUM CHLORIDE 0.9 % IV SOLN: 500.0000 mL | INTRAVENOUS | Status: DC

## 2015-05-19 NOTE — Progress Notes (Signed)
To PACU  Awake and Alert,  Pt Awake and alert  Report to RN

## 2015-05-19 NOTE — Progress Notes (Signed)
Called to room to assist during endoscopic procedure.  Patient ID and intended procedure confirmed with present staff. Received instructions for my participation in the procedure from the performing physician.  

## 2015-05-19 NOTE — Patient Instructions (Signed)
YOU HAD AN ENDOSCOPIC PROCEDURE TODAY AT [facility]:   Refer to the procedure report that was given to you for any specific questions about what was found during the examination.  If the procedure report does not answer your questions, please call your gastroenterologist to clarify.  If you requested that your care partner not be given the details of your procedure findings, then the procedure report has been included in a sealed envelope for you to review at your convenience later.  YOU SHOULD EXPECT: Some feelings of bloating in the abdomen. Passage of more gas than usual.  Walking can help get rid of the air that was put into your GI tract during the procedure and reduce the bloating. If you had a lower endoscopy (such as a colonoscopy or flexible sigmoidoscopy) you may notice spotting of blood in your stool or on the toilet paper. If you underwent a bowel prep for your procedure, you may not have a normal bowel movement for a few days.  Please Note:  You might notice some irritation and congestion in your nose or some drainage.  This is from the oxygen used during your procedure.  There is no need for concern and it should clear up in a day or so.  SYMPTOMS TO REPORT IMMEDIATELY:   Following lower endoscopy (colonoscopy or flexible sigmoidoscopy):  Excessive amounts of blood in the stool  Significant tenderness or worsening of abdominal pains  Swelling of the abdomen that is new, acute  Fever of 100F or higher   For urgent or emergent issues, a gastroenterologist can be reached at any hour by calling [phone].   DIET: Your first meal following the procedure should be a small meal and then it is ok to progress to your normal diet. Heavy or fried foods are harder to digest and may make you feel nauseous or bloated.  Likewise, meals heavy in dairy and vegetables can increase bloating.  Drink plenty of fluids but you should avoid alcoholic beverages for 24  hours.  ACTIVITY:  You should plan to take it easy for the rest of today and you should NOT DRIVE or use heavy machinery until tomorrow (because of the sedation medicines used during the test).    FOLLOW UP: Our staff will call the number listed on your records the next business day following your procedure to check on you and address any questions or concerns that you may have regarding the information given to you following your procedure. If we do not reach you, we will leave a message.  However, if you are feeling well and you are not experiencing any problems, there is no need to return our call.  We will assume that you have returned to your regular daily activities without incident.  If any biopsies were taken you will be contacted by phone or by letter within the next 1-3 weeks.  Please call us at [phone] if you have not heard about the biopsies in 3 weeks.    SIGNATURES/CONFIDENTIALITY: You and/or your care partner have signed paperwork which will be entered into your electronic medical record.  These signatures attest to the fact that that the information above on your After Visit Summary has been reviewed and is understood.  Full responsibility of the confidentiality of this discharge information lies with you and/or your care-partner.   Resume Coumadin in 2 days,but resume reminder of medications. Information given on polyps,diverticulosis and high fiber diet.

## 2015-05-19 NOTE — Op Note (Addendum)
[facility]  [address] [place], [id number]   COLONOSCOPY PROCEDURE REPORT  PATIENT: [name], [name]  MR#: [id number] BIRTHDATE: 1940/12/01 , 75  yrs. old GENDER: male ENDOSCOPIST: Milus Banister, MD PROCEDURE DATE:  05/19/2015 PROCEDURE:   Colonoscopy, surveillance and Colonoscopy with snare polypectomy First Screening Colonoscopy - Avg.  risk and is 50 yrs.  old or older - No.  Prior Negative Screening - Now for repeat screening. N/A  History of Adenoma - Now for follow-up colonoscopy & has been > or = to 3 yrs.  N/A  Polyps removed today? Yes ASA CLASS:   Class II INDICATIONS:Adenomatous colon polyps.  Dr.  Olevia Perches colonsocopy 2011 2 small TAs.  Colonoscopy Dr.  Ardis Hughs 09/2014 Six polyps were found, removed and sent to pathology; The largest was heaped up, soft, 3cm across, located in proximal transverse segment, removed in piecemeal fashion.  Path showed the largest was TVA (no HGD) and the rest were TAs.Marland Kitchen MEDICATIONS: Monitored anesthesia care and Propofol 250 mg IV  DESCRIPTION OF PROCEDURE:   After the risks benefits and alternatives of the procedure were thoroughly explained, informed consent was obtained.  The digital rectal exam revealed no abnormalities of the rectum.   The LB [id number]  endoscope was introduced through the anus and advanced to the cecum, which was identified by both the appendix and ileocecal valve. No adverse events experienced.   The quality of the prep was excellent.  The instrument was then slowly withdrawn as the colon was fully examined. Estimated blood loss is zero unless otherwise noted in this procedure report.  COLON FINDINGS: Four polyps were found, removed and sent to pathology.  One was long, thin, sessile, located in the cecum, measured 1.4cm long.  This was removed in piecemeal fashion and sent to pathology with cold snare (jar 1).  The other 3 polyps were sessile, 2-37mm across, located in ascending  segment, removed with cold snare, sent to pathology (jar 2).  The site of 2016 transverse colon polypectomy was clearly located by the blue tattoo and there was no residual polyp at the site.  There were multiple diverticulum in the left colon.  The examination was otherwise normal.  Retroflexed views revealed no abnormalities. The time to cecum = 2.2 Withdrawal time = 17.7   The scope was withdrawn and the procedure completed. COMPLICATIONS: There were no immediate complications.  ENDOSCOPIC IMPRESSION: Four polyps were found, removed and sent to pathology. The site of 2016 transverse colon polypectomy was clearly located by the blue tattoo and there was no residual polyp There were multiple diverticulum in the left colon. The examination was otherwise normal  RECOMMENDATIONS: Await pathology results Likely repeat colonoscopy in 1 year Resume coumadin in 2 days  eSigned:  Milus Banister, MD 05/19/2015 2:32 PM Revised: 05/19/2015 2:32 PM

## 2015-05-20 ENCOUNTER — Telehealth: Payer: Self-pay | Admitting: *Deleted

## 2015-05-20 NOTE — Telephone Encounter (Signed)
  Follow up Call-  Call back number 05/19/2015 10/17/2014  Post procedure Call Back phone  # [phone] wifes cell phone  Permission to leave phone message Yes Yes   busy after several attempts

## 2015-05-25 ENCOUNTER — Telehealth: Payer: Self-pay | Admitting: Endocrinology

## 2015-05-25 ENCOUNTER — Ambulatory Visit (INDEPENDENT_AMBULATORY_CARE_PROVIDER_SITE_OTHER): Payer: Medicare Other

## 2015-05-25 DIAGNOSIS — Z7901 Long term (current) use of anticoagulants: Secondary | ICD-10-CM

## 2015-05-25 DIAGNOSIS — I4891 Unspecified atrial fibrillation: Secondary | ICD-10-CM

## 2015-05-25 LAB — POCT INR: INR: 1.3

## 2015-05-25 NOTE — Telephone Encounter (Signed)
Wife calling to see if they need to bring his meter up he has had some fluctuating BS and they are getting ready to leave on a trip

## 2015-05-25 NOTE — Telephone Encounter (Signed)
Patient is with his wife at a Drs. Appointment, message left on their answering machine.  I asked her to call back tomorrow.

## 2015-05-25 NOTE — Telephone Encounter (Signed)
Is he taking the Levemir at bedtime and 8 units of Humalog at lunch?

## 2015-05-25 NOTE — Telephone Encounter (Signed)
Patients wife called, she's concerned about her husbands sugar readings.  03/03- 9 am-140           12 pm-201             6 pm- 184 03/04- 9 am-170  12 pm-179   6 pm-213  03/05- 9 am- 154  12 pm- 206  6 pm- 193 He's taking 14 units of Humalog and 28 units of levemir, they are going out of town tomorrow and wants to know if his insulin should be adjusted? Please advise

## 2015-05-26 ENCOUNTER — Telehealth: Payer: Self-pay | Admitting: Endocrinology

## 2015-05-26 NOTE — Telephone Encounter (Signed)
Noted, patient and his wife are aware and confirmed understanding.

## 2015-05-26 NOTE — Telephone Encounter (Signed)
He will need to change Levemir to 15 units at breakfast and suppertime and continue same dose of Humalog

## 2015-05-26 NOTE — Telephone Encounter (Signed)
He shoots 14 humalog before meals and 28 levemir before bed

## 2015-05-26 NOTE — Telephone Encounter (Signed)
Please see below,  Is he taking the Levemir at bedtime and 8 units of Humalog at lunch?

## 2015-05-26 NOTE — Telephone Encounter (Signed)
Pt wife returning your call. 

## 2015-05-27 ENCOUNTER — Encounter: Payer: Self-pay | Admitting: Gastroenterology

## 2015-06-01 ENCOUNTER — Telehealth: Payer: Self-pay | Admitting: Endocrinology

## 2015-06-01 NOTE — Telephone Encounter (Signed)
Please see below and advise.

## 2015-06-01 NOTE — Telephone Encounter (Signed)
Increase Levemir to 18 units twice a day, will follow-up on his next visit

## 2015-06-01 NOTE — Telephone Encounter (Signed)
Pt calling to give readings 3/8 AM 188; after lunch 189; dinner 239 3/9 160; 206; 158 3/10 157; 190 3/11 206; 201; 192 3/12 196; 212; 169 3/13 185  In am he shoot 15 u of levemir, before lunch 14 u humalog and before dinner 14 u humalog again and bedtime 15 u levemir

## 2015-06-01 NOTE — Telephone Encounter (Signed)
Noted, detailed voice mail left on patients phone.

## 2015-06-08 ENCOUNTER — Other Ambulatory Visit: Payer: Self-pay | Admitting: Internal Medicine

## 2015-06-08 ENCOUNTER — Other Ambulatory Visit: Payer: Self-pay | Admitting: Cardiology

## 2015-06-09 ENCOUNTER — Other Ambulatory Visit (INDEPENDENT_AMBULATORY_CARE_PROVIDER_SITE_OTHER): Payer: Medicare Other

## 2015-06-09 DIAGNOSIS — E1165 Type 2 diabetes mellitus with hyperglycemia: Secondary | ICD-10-CM

## 2015-06-09 DIAGNOSIS — Z794 Long term (current) use of insulin: Secondary | ICD-10-CM

## 2015-06-09 LAB — COMPREHENSIVE METABOLIC PANEL
ALBUMIN: 3.9 g/dL (ref 3.5–5.2)
ALK PHOS: 55 U/L (ref 39–117)
ALT: 11 U/L (ref 0–53)
AST: 12 U/L (ref 0–37)
BILIRUBIN TOTAL: 0.8 mg/dL (ref 0.2–1.2)
BUN: 52 mg/dL — AB (ref 6–23)
CO2: 31 mEq/L (ref 19–32)
CREATININE: 1.88 mg/dL — AB (ref 0.40–1.50)
Calcium: 9.7 mg/dL (ref 8.4–10.5)
Chloride: 97 mEq/L (ref 96–112)
GFR: 37.46 mL/min — ABNORMAL LOW (ref >60.00)
Glucose, Bld: 222 mg/dL — ABNORMAL HIGH (ref 70–99)
POTASSIUM: 3.1 meq/L — AB (ref 3.5–5.1)
Sodium: 141 mEq/L (ref 135–145)
TOTAL PROTEIN: 6.4 g/dL (ref 6.0–8.3)

## 2015-06-09 LAB — HEMOGLOBIN A1C: Hgb A1c MFr Bld: 7.9 % — ABNORMAL HIGH (ref 4.6–6.5)

## 2015-06-10 ENCOUNTER — Ambulatory Visit (INDEPENDENT_AMBULATORY_CARE_PROVIDER_SITE_OTHER): Payer: Medicare Other | Admitting: *Deleted

## 2015-06-10 DIAGNOSIS — Z7901 Long term (current) use of anticoagulants: Secondary | ICD-10-CM | POA: Diagnosis not present

## 2015-06-10 DIAGNOSIS — I4891 Unspecified atrial fibrillation: Secondary | ICD-10-CM

## 2015-06-10 LAB — POCT INR: INR: 1.9

## 2015-06-12 ENCOUNTER — Ambulatory Visit (INDEPENDENT_AMBULATORY_CARE_PROVIDER_SITE_OTHER): Payer: Medicare Other | Admitting: Endocrinology

## 2015-06-12 ENCOUNTER — Other Ambulatory Visit: Payer: Self-pay | Admitting: *Deleted

## 2015-06-12 ENCOUNTER — Encounter: Payer: Self-pay | Admitting: Endocrinology

## 2015-06-12 VITALS — BP 114/70 | HR 66 | Temp 97.4°F | Resp 16 | Ht 74.0 in | Wt 258.2 lb

## 2015-06-12 DIAGNOSIS — E1165 Type 2 diabetes mellitus with hyperglycemia: Secondary | ICD-10-CM

## 2015-06-12 DIAGNOSIS — Z794 Long term (current) use of insulin: Secondary | ICD-10-CM

## 2015-06-12 MED ORDER — INSULIN GLARGINE 300 UNIT/ML ~~LOC~~ SOPN: 40.0000 [IU] | PEN_INJECTOR | Freq: Every day | SUBCUTANEOUS | Status: DC

## 2015-06-12 NOTE — Progress Notes (Signed)
Patient ID: [name], male   DOB: 27-Oct-1940, 75 y.o.   MRN: [id number]    Reason for Appointment : F/u for Type 2 Diabetes  History of Present Illness          Diagnosis: Type 2 diabetes mellitus, date of diagnosis: 2008       Past history: He was initially treated with Metformin and also given Amayl at some point. With this he had fair control of his diabetes with A1c had usually been over 7% except once in 2013.  He was taken off metformin probably in March this year after a hospitalization, possibly because of renal dysfunction He was started on Levemir insulin in May when his blood sugars were significantly higher, fasting readings averaging 220.  Levemir progressively increased and was on 35 units at night since 12/20/12 He has not been on any other medications either orally or injectable for his diabetes Because of his A1c of over 10% in 10/14 on basal insulin alone he was started on NovoLog with each meal in 10/14  Recent history:     INSULIN regimen is described as:   November 18 units twice a day Humalog 14 lunch-- 14 units ac supper  His blood sugars have been significantly higher in the last few weeks and not clear why, possibly from not using metformin Because of higher readings throughout the day his Levemir was changed to twice a day and Humalog increased  Again his renal function has been inadequate and metformin has been stopped   A1c is slightly higher as expected at 7.9, previously 7.2    Current blood sugar patterns and problems identified:   His fasting blood sugars are fairly high consistently  Blood sugars are variable after lunch and supper and recently not as much after lunch compared to after supper  He has not changed his diet and his portions are fairly well controlled His wife is usually helping him watch his diet  He has been able to continue using brand name insulin even though it has been more expensive for him  Again he has  difficulty losing weight and is not able to do much exercise  Compliance with the medical regimen: Good   Glucose monitoring:  done  Up to 3  times a day        Glucometer:  Accu-Chek    Blood Glucose readings from download as follows   Mean values apply above for all meters except median for One Touch  PRE-MEAL Fasting Lunch Dinner Bedtime Overall  Glucose range: 147-185       Mean/median: 164     185+/-86    POST-MEAL PC Breakfast PC Lunch PC Dinner  Glucose range:  143-248  158-244   Mean/median:  199  197     Hypoglycemia frequency:   None  Self-care: The diet that the patient has been following is: Smaller portions and relatively balanced meals  Meals: 2 meals per day. Noon and 6 pm  Low fat, trying to eat more salads. Avoiding drinks with sugar and no juices       Physical activity: exercise: Minimal, limited by back pain           Dietician visit: Most recent: Unknown.  CDE visit: 11/14                   Weight control: This appears to be fluctuating significantly  Wt Readings from Last 3 Encounters:  06/12/15 258 lb 3.2 oz (  117.119 kg)  05/19/15 254 lb (115.214 kg)  05/14/15 252 lb (114.306 kg)    Lab Results  Component Value Date   HGBA1C 7.9* 06/09/2015   HGBA1C 7.2* 03/03/2015   HGBA1C 6.7* 12/02/2014   Lab Results  Component Value Date   MICROALBUR 1.6 09/02/2014   LDLCALC 90 09/02/2014   CREATININE 1.88* 06/09/2015       Medication List       This list is accurate as of: 06/12/15 11:59 PM.  Always use your most recent med list.               ACCU-CHEK FASTCLIX LANCETS Misc  USE ONE  TO CHECK BLOOD SUGAR THREE TIMES DAILY     acetaminophen 325 MG tablet  Commonly known as:  TYLENOL  Take 325 mg by mouth every morning.     allopurinol 100 MG tablet  Commonly known as:  ZYLOPRIM  TAKE ONE TABLET BY MOUTH ONCE DAILY     amLODipine 5 MG tablet  Commonly known as:  NORVASC  Take 2.5 mg by mouth daily.     aspirin 81 MG tablet  Take 81  mg by mouth once a week.     atorvastatin 10 MG tablet  Commonly known as:  LIPITOR  TAKE ONE TABLET BY MOUTH ONCE DAILY     carboxymethylcellulose 0.5 % Soln  Commonly known as:  REFRESH PLUS  Place 2 drops into both eyes daily.     carvedilol 6.25 MG tablet  Commonly known as:  COREG  TAKE ONE TABLET BY MOUTH TWICE DAILY     chlorthalidone 25 MG tablet  Commonly known as:  HYGROTON  Take 1 tablet daily as needed when weight reaches 246 lbs. or greater only     Cholecalciferol 10000 units Caps  Take 1,000 Units by mouth daily.     colchicine 0.6 MG tablet  Take 1 tablet (0.6 mg total) by mouth daily.     colchicine 0.6 MG tablet  Take one tablet by mouth once daily as needed for Gout. --Must have appt with new PCP for further refills     furosemide 80 MG tablet  Commonly known as:  LASIX  Take 1 tablet (80 mg total) by mouth 2 (two) times daily.     gabapentin 300 MG capsule  Commonly known as:  NEURONTIN  Take 300 mg by mouth 4 (four) times daily. LEG PAIN     glucose blood test strip  Commonly known as:  ACCU-CHEK AVIVA PLUS  Use as instructed to check blood sugar 3 times per day dx code E11.49     Insulin Glargine 300 UNIT/ML Sopn  Commonly known as:  TOUJEO SOLOSTAR  Inject 40 Units into the skin daily.     insulin lispro 100 UNIT/ML KiwkPen  Commonly known as:  HUMALOG KWIKPEN  Inject 0.08-0.12 mLs (8-12 Units total) into the skin 2 (two) times daily.     ipratropium 0.06 % nasal spray  Commonly known as:  ATROVENT  Place 1 spray into both nostrils daily.     LEVEMIR FLEXTOUCH 100 UNIT/ML Pen  Generic drug:  Insulin Detemir  INJECT 35 UNITS SUBCUTANEOUSLY AT BEDTIME     losartan 50 MG tablet  Commonly known as:  COZAAR  Take 1 tablet (50 mg total) by mouth daily.     naphazoline-pheniramine 0.025-0.3 % ophthalmic solution  Commonly known as:  NAPHCON-A  Place 1 drop into both eyes daily.     nitroGLYCERIN 0.4 MG SL  tablet  Commonly known as:   NITROSTAT  Place 1 tablet (0.4 mg total) under the tongue every 5 (five) minutes as needed for chest pain (MAX 3 TABLETS).     potassium chloride SA 20 MEQ tablet  Commonly known as:  K-DUR,KLOR-CON  Take 30 mEq by mouth 2 (two) times daily. TAKE 1.5 TWICE DAILY TO TOTAL 30 MEQ     warfarin 2.5 MG tablet  Commonly known as:  COUMADIN  TAKE 2.5 MG BY MOUTH DAILY EXCEPT WEDNESDAY AND TAKE 5 MG        Allergies:  Allergies  Allergen Reactions  . Avelox [Moxifloxacin Hcl In Nacl] Swelling, Rash and Other (See Comments)    Patient became hypotensive after infusion started Because of a history of documented adverse serious drug reaction;Medi Alert bracelet  is recommended  . Penicillins Anaphylaxis, Other (See Comments) and Swelling    REACTION: anaphylaxis Because of a history of documented adverse serious drug reaction;Medi Alert bracelet  is recommended  . Quinolones Swelling    Past Medical History  Diagnosis Date  . Diabetes mellitus   . Hyperlipidemia   . Hypertension   . Pilonidal cyst   . Atrial fibrillation ([place])     Previous long-term amiodarone therapy with multiple cardioversions / amiodarone stopped September, 2009  . Atrial flutter [facility])     Started November, 2010, Left-sided and cannot ablate  . Left atrial thrombus [facility])     Remote past... cardioversions done since that time  . Wide-complex tachycardia ([place])   . Left ventricular ejection fraction less than 40%   . Gout   . AAA (abdominal aortic aneurysm) [facility])     Surgical repair  . Discolored skin   . S/P ICD (internal cardiac defibrillator) procedure     Dr. Lovena Le 2009... by the pacing  . SOB (shortness of breath)     Large left effusion/ thoracentesis/hospitalization/November, 2011... exudated.. cytology negative.. Dr.Wert.. no proof of mesothelioma  . Pericardial effusion   . Pleural effusion     Large loculated effusion on the left side November, 2011. This was tapped. It was exudative. Cytology  revealed no cancer no proof of mesothelioma area pulmonary team felt that no further workup was needed  . S/P AAA repair   . Spinal stenosis     Surgery Dr.Elsner  . CAD (coronary artery disease)     Catheterization July, 2008... name and vein grafts patent but low cardiac output  . Warfarin anticoagulation   . Cardiomyopathy     Ischemic... ICD  . Combined systolic and diastolic CHF   . Venous insufficiency     Toe discoloration chronic  . Mitral regurgitation     Mild echo  . Aortic valve sclerosis   . Nasal drainage     Chronic  . Alcohol ingestion of more than four drinks per week     Excess beer  not a dependency problem  . Chronotropic incompetence     IV pacing rate adjusted  . Thrombophlebitis of superficial veins of upper extremities     Possible venous stenosis from defibrillator  . Pericardial effusion     November, 2011 .. decreased during hospitalization  . Eye abnormality     Ophthalmologist questions a clot in one of his eyes, May, 2012  . Overweight(278.02)     November, 2012  . Pleural thickening   . Ejection fraction     Ejection fraction has varied over time from 35-50%.,, Echoes are technically very difficult,,, EF 50%, echo,  May 25, 2011, technically very difficult  . Drug therapy     Redness and swelling with Avelox infusion May 24, 2011  . COPD (chronic obstructive pulmonary disease) ([place])   . Myocardial infarction ([place])   . Spinal cord stimulator status     October, 2013  . Carotid artery disease ([place])     Doppler, December, 2013, 0-39% bilateral  . Pneumonia   . Arthritis   . Ventral hernia     April, 2014, result of his abdominal surgery  . Bony abnormality     Patient's manubrium is slightly displaced to the right  . Ascending aortic aneurysm ([place]) 05/10/2014  . 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 Internal hemorrhoids   . Diverticulosis   . Adenomatous colon polyp     Past Surgical History  Procedure Laterality Date  . Colonoscopy w/  polypectomy    . Lumbar fusion    . Pilonidal cyst removal    . Surgery scrotal / testicular    . Coronary artery bypass graft  2004  . Abdominal aortic aneurysm repair  11/2002  . Bi-ventricular pacemaker insertion (crt-p)  02-11-2013    Pt with previously implanted MDT CRTD downgraded to CRTP by Dr Lovena Le 02-11-13  . Back surgery    . Incision and drainage abscess / hematoma of bursa / knee / thigh    . Implantable cardioverter defibrillator (icd) generator change N/A 02/11/2013    Procedure: ICD GENERATOR CHANGE;  Surgeon: Evans Lance, MD;  Location: [facility] CATH LAB;  Service: Cardiovascular;  Laterality: N/A;  . Leg surgery Right     Family History  Problem Relation Age of Onset  . Hypertension Mother   . Stroke Mother   . Diabetes Father   . Coronary artery disease Father   . Other Father     DVT  . Colon cancer Neg Hx   . Diabetes Brother   . Heart attack Neg Hx     Social History:  reports that he quit smoking about 20 years ago. His smoking use included Cigarettes. He started smoking about 59 years ago. He has a 117 pack-year smoking history. He has never used smokeless tobacco. He reports that he drinks about 1.2 oz of alcohol per week. He reports that he does not use illicit drugs.    Review of Systems       Lipids: Treated with Lipitor  only taking 10 mg, Followed by PCP and cardiologist Hyperlipidemia: LDL is under 100 but not under 70     Lab Results  Component Value Date   CHOL 152 09/02/2014   HDL 40.00 09/02/2014   LDLCALC 90 09/02/2014   TRIG 112.0 09/02/2014   CHOLHDL 4 09/02/2014       The blood pressure has been under control with current regimen of chlorthalidone, amlodipine and losartan BP at home Check periodically Also he takes extra chlorthalidone when he has weight gain  Renal dysfunction: Creatinine has been quite variable , probably related to his volume status   Lab Results  Component Value Date   CREATININE 1.88* 06/09/2015   BUN 52*  06/09/2015   NA 141 06/09/2015   K 3.1* 06/09/2015   CL 97 06/09/2015   CO2 31 06/09/2015      Has known neuropathy: He has had pains and tingling in his feet and lower legs.  Mostly having some symptoms in day;  some early morning and during the night.  Takes gabapentin for this and extra tablet at bedtime  Requires phlebotomy for polycythemia periodically   LABS:  Anti-coag visit on 06/10/2015  Component Date Value Ref Range Status  . INR 06/10/2015 1.9   Final  Lab on 06/09/2015  Component Date Value Ref Range Status  . Hgb A1c MFr Bld 06/09/2015 7.9* 4.6 - 6.5 % Final   Glycemic Control Guidelines for People with Diabetes:Non Diabetic:  <6%Goal of Therapy: <7%Additional Action Suggested:  >8%   . Sodium 06/09/2015 141  135 - 145 mEq/L Final  . Potassium 06/09/2015 3.1* 3.5 - 5.1 mEq/L Final  . Chloride 06/09/2015 97  96 - 112 mEq/L Final  . CO2 06/09/2015 31  19 - 32 mEq/L Final  . Glucose, Bld 06/09/2015 222* 70 - 99 mg/dL Final  . BUN 06/09/2015 52* 6 - 23 mg/dL Final  . Creatinine, Ser 06/09/2015 1.88* 0.40 - 1.50 mg/dL Final  . Total Bilirubin 06/09/2015 0.8  0.2 - 1.2 mg/dL Final  . Alkaline Phosphatase 06/09/2015 55  39 - 117 U/L Final  . AST 06/09/2015 12  0 - 37 U/L Final  . ALT 06/09/2015 11  0 - 53 U/L Final  . Total Protein 06/09/2015 6.4  6.0 - 8.3 g/dL Final  . Albumin 06/09/2015 3.9  3.5 - 5.2 g/dL Final  . Calcium 06/09/2015 9.7  8.4 - 10.5 mg/dL Final  . GFR 06/09/2015 37.46* >60.00 mL/min Final    Physical Examination:  BP 114/70 mmHg  Pulse 66  Temp(Src) 97.4 F (36.3 C)  Resp 16  Ht 6\' 2"  (1.88 m)  Wt 258 lb 3.2 oz (117.119 kg)  BMI 33.14 kg/m2  SpO2 95%     ASSESSMENT/PLAN:  Diabetes type 2 See history of present illness for detailed discussion of his current management, blood sugar patterns and problems identified  His blood sugars have been progressively more difficult to control even with increasing his insulin significantly  especially basal.  Most likely this is from stopping metformin with his renal function deteriorated He is doing fairly well with his meal planning and watching portions and calories as well as carbohydrates Not able to exercise but weight is stable  Although he may benefit from a drug like Victoza the cost may be a concern for him For now since [place] evidently not covered he will try Toujeo instead of Levemir with a free trial coupon Discussed the differences between this other insulins and timing of injection as well as given him a flowsheet to adjust the dose every 3-4 days based on fasting readings  If he has more consistent control with smaller amounts of insulin once a day he will continue this  Also he will increase his suppertime coverage by 4 units of Humalog   Counseling time on subjects discussed above is over 50% of today's 25 minute visit  Patient Instructions  Toujeo 40 units in am daily instead of Levemir   Humalog 14 in am and 18 at supper  Check blood sugars on waking up   times a week Also check blood sugars about 2 hours after a meal and do this after different meals by rotation  Recommended blood sugar levels on waking up is 90-130 and about 2 hours after meal is 130-160  Please bring your blood sugar monitor to each visit, thank you      [facility] 06/14/2015, 3:30 PM

## 2015-06-12 NOTE — Patient Instructions (Addendum)
Toujeo 40 units in am daily instead of Levemir   Humalog 14 in am and 18 at supper  Check blood sugars on waking up   times a week Also check blood sugars about 2 hours after a meal and do this after different meals by rotation  Recommended blood sugar levels on waking up is 90-130 and about 2 hours after meal is 130-160  Please bring your blood sugar monitor to each visit, thank you

## 2015-06-14 NOTE — Progress Notes (Signed)
Quick Note:  Please let patient know that the potassium level is low and he needs to discuss with cardiologist ______

## 2015-06-15 ENCOUNTER — Encounter: Payer: Self-pay | Admitting: Internal Medicine

## 2015-06-15 ENCOUNTER — Ambulatory Visit (INDEPENDENT_AMBULATORY_CARE_PROVIDER_SITE_OTHER): Payer: Medicare Other | Admitting: Internal Medicine

## 2015-06-15 VITALS — BP 112/72 | HR 76 | Temp 98.3°F | Resp 16 | Wt 256.0 lb

## 2015-06-15 DIAGNOSIS — M10042 Idiopathic gout, left hand: Secondary | ICD-10-CM

## 2015-06-15 DIAGNOSIS — M109 Gout, unspecified: Secondary | ICD-10-CM

## 2015-06-15 MED ORDER — COLCHICINE 0.6 MG PO TABS: 0.6000 mg | ORAL_TABLET | Freq: Every day | ORAL | Status: DC

## 2015-06-15 NOTE — Progress Notes (Signed)
Subjective:    Patient ID: [name], male    DOB: 01/06/1941, 75 y.o.   MRN: [id number]  HPI  He is here for an acute visit for finger pain and swelling.   2-3 days ago he woke up with left fourth finger redness and swelling.  It is painful.  He has decreasd ROM.  He is unsure if it is warm.  He denies accident or injury as the cause. He soaked it yestrerday and the swelling is down a little.  He denies fever.   He does have a history of gout - it has been a long time since his last attack.  He is taking his allopurinol daily.  He has taken colchicine in the past and denies side effects.  He has never had a gout flare in his hand.  He has had some increased seafood, but that was over one week ago.  He drinks a few beers a week.      Medications and allergies reviewed with patient and updated if appropriate.  Patient Active Problem List   Diagnosis Date Noted  . Chronic systolic heart failure ([place]) 02/24/2015  . Polycythemia vera ([place]) 07/28/2014  . Ascending aortic aneurysm ([place]) 05/10/2014  . Hypoxia 05/05/2014  . COPD exacerbation ([place]) 05/05/2014  . Diabetes ([place]) 05/05/2014  . Chronic systolic CHF (congestive heart failure) ([place]) 03/07/2014  . History of DVT (deep vein thrombosis) 03/07/2014  . Cardiomyopathy, ischemic 02/05/2014  . Pleural plaque without asbestosis 01/23/2014  . Elevated hemoglobin ([place]) 01/14/2014  . CKD (chronic kidney disease) stage 3, GFR 30-59 ml/min 01/14/2014  . Asbestos exposure 01/14/2014  . Shortness of breath 01/01/2014  . Abscess, scrotum 03/05/2013  . Ejection fraction   . Hypopotassemia 12/20/2012  . ACE-inhibitor cough 10/04/2012  . Ventral hernia   . Bony abnormality   . Carotid artery disease ([place])   . Spinal cord stimulator status   . Peripheral vascular disease with claudication ([place]) 08/31/2011  . Atrial fibrillation ([place])   . Spinal stenosis   . CAD (coronary artery disease)   . Venous insufficiency   . Mitral  regurgitation   . Aortic valve sclerosis   . Alcohol ingestion of more than four drinks per week   . Chronotropic incompetence   . Thrombophlebitis of superficial veins of upper extremities   . Pericardial effusion   . Overweight(278.02)   . Pleural effusion   . Hx of allergic drug reaction   . Hyperlipidemia   . Hypertension   . Atrial flutter ([place])   . Left atrial thrombus ([place])   . AAA (abdominal aortic aneurysm) ([place])   . S/P ICD (internal cardiac defibrillator) procedure   . Warfarin anticoagulation   . Diabetic polyneuropathy ([place]) 01/07/2010  . DIVERTICULOSIS, COLON 06/10/2009  . Uncontrolled diabetes mellitus with peripheral circulatory disorder ([place]) 03/11/2009  . BACK PAIN, LUMBAR 06/12/2008  . 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 Acute gout 07/27/2006  . CORONARY ARTERY BYPASS GRAFT, HX OF 07/27/2006    Current Outpatient Prescriptions on File Prior to Visit  Medication Sig Dispense Refill  . ACCU-CHEK FASTCLIX LANCETS MISC USE ONE  TO CHECK BLOOD SUGAR THREE TIMES DAILY 102 each 5  . acetaminophen (TYLENOL) 325 MG tablet Take 325 mg by mouth every morning.     Marland Kitchen allopurinol (ZYLOPRIM) 100 MG tablet TAKE ONE TABLET BY MOUTH ONCE DAILY 90 tablet 0  . amLODipine (NORVASC) 5 MG tablet Take 2.5 mg by mouth daily.    Marland Kitchen  aspirin 81 MG tablet Take 81 mg by mouth once a week.     Marland Kitchen atorvastatin (LIPITOR) 10 MG tablet TAKE ONE TABLET BY MOUTH ONCE DAILY 90 tablet 2  . carboxymethylcellulose (REFRESH PLUS) 0.5 % SOLN Place 2 drops into both eyes daily.     . carvedilol (COREG) 6.25 MG tablet TAKE ONE TABLET BY MOUTH TWICE DAILY 180 tablet 0  . chlorthalidone (HYGROTON) 25 MG tablet Take 1 tablet daily as needed when weight reaches 246 lbs. or greater only (Patient taking differently: Take 25 mg by mouth daily as needed (when weight reaches 246 lbs or greater only). ) 45 tablet 3  . Cholecalciferol 10000 UNITS CAPS Take 1,000 Units by mouth daily.    .  colchicine 0.6 MG tablet Take 1 tablet (0.6 mg total) by mouth daily. 10 tablet 0  . furosemide (LASIX) 80 MG tablet Take 1 tablet (80 mg total) by mouth 2 (two) times daily. 180 tablet 3  . gabapentin (NEURONTIN) 300 MG capsule Take 300 mg by mouth 4 (four) times daily. LEG PAIN    . glucose blood (ACCU-CHEK AVIVA PLUS) test strip Use as instructed to check blood sugar 3 times per day dx code E11.49 100 each 5  . Insulin Glargine (TOUJEO SOLOSTAR) 300 UNIT/ML SOPN Inject 40 Units into the skin daily. 4.5 mL 3  . insulin lispro (HUMALOG KWIKPEN) 100 UNIT/ML KiwkPen Inject 0.08-0.12 mLs (8-12 Units total) into the skin 2 (two) times daily. (Patient taking differently: Inject 14 Units into the skin 2 (two) times daily. ) 5 pen 3  . ipratropium (ATROVENT) 0.06 % nasal spray Place 1 spray into both nostrils daily.     Marland Kitchen losartan (COZAAR) 50 MG tablet Take 1 tablet (50 mg total) by mouth daily. 90 tablet 3  . naphazoline-pheniramine (NAPHCON-A) 0.025-0.3 % ophthalmic solution Place 1 drop into both eyes daily.     . nitroGLYCERIN (NITROSTAT) 0.4 MG SL tablet Place 1 tablet (0.4 mg total) under the tongue every 5 (five) minutes as needed for chest pain (MAX 3 TABLETS). 25 tablet 3  . potassium chloride SA (K-DUR,KLOR-CON) 20 MEQ tablet Take 30 mEq by mouth 2 (two) times daily. TAKE 1.5 TWICE DAILY TO TOTAL 30 MEQ    . warfarin (COUMADIN) 2.5 MG tablet TAKE 2.5 MG BY MOUTH DAILY EXCEPT WEDNESDAY AND TAKE 5 MG 130 tablet 0   No current facility-administered medications on file prior to visit.    Past Medical History  Diagnosis Date  . Diabetes mellitus   . Hyperlipidemia   . Hypertension   . Pilonidal cyst   . Atrial fibrillation ([place])     Previous long-term amiodarone therapy with multiple cardioversions / amiodarone stopped September, 2009  . Atrial flutter [facility])     Started November, 2010, Left-sided and cannot ablate  . Left atrial thrombus [facility])     Remote past... cardioversions done since  that time  . Wide-complex tachycardia ([place])   . Left ventricular ejection fraction less than 40%   . Gout   . AAA (abdominal aortic aneurysm) [facility])     Surgical repair  . Discolored skin   . S/P ICD (internal cardiac defibrillator) procedure     Dr. Lovena Le 2009... by the pacing  . SOB (shortness of breath)     Large left effusion/ thoracentesis/hospitalization/November, 2011... exudated.. cytology negative.. Dr.Wert.. no proof of mesothelioma  . Pericardial effusion   . Pleural effusion     Large loculated effusion on the left side November,  2011. This was tapped. It was exudative. Cytology revealed no cancer no proof of mesothelioma area pulmonary team felt that no further workup was needed  . S/P AAA repair   . Spinal stenosis     Surgery Dr.Elsner  . CAD (coronary artery disease)     Catheterization July, 2008... name and vein grafts patent but low cardiac output  . Warfarin anticoagulation   . Cardiomyopathy     Ischemic... ICD  . Combined systolic and diastolic CHF   . Venous insufficiency     Toe discoloration chronic  . Mitral regurgitation     Mild echo  . Aortic valve sclerosis   . Nasal drainage     Chronic  . Alcohol ingestion of more than four drinks per week     Excess beer  not a dependency problem  . Chronotropic incompetence     IV pacing rate adjusted  . Thrombophlebitis of superficial veins of upper extremities     Possible venous stenosis from defibrillator  . Pericardial effusion     November, 2011 .. decreased during hospitalization  . Eye abnormality     Ophthalmologist questions a clot in one of his eyes, May, 2012  . Overweight(278.02)     November, 2012  . Pleural thickening   . Ejection fraction     Ejection fraction has varied over time from 35-50%.,, Echoes are technically very difficult,,, EF 50%, echo, May 25, 2011, technically very difficult  . Drug therapy     Redness and swelling with Avelox infusion May 24, 2011  . COPD (chronic  obstructive pulmonary disease) ([place])   . Myocardial infarction ([employer])   . Spinal cord stimulator status     October, 2013  . Carotid artery disease ([place])     Doppler, December, 2013, 0-39% bilateral  . Pneumonia   . Arthritis   . Ventral hernia     April, 2014, result of his abdominal surgery  . Bony abnormality     Patient's manubrium is slightly displaced to the right  . Ascending aortic aneurysm ([place]) 05/10/2014  . Polycythemia vera ([place]) 07/28/2014  . Internal hemorrhoids   . Diverticulosis   . Adenomatous colon polyp     Past Surgical History  Procedure Laterality Date  . Colonoscopy w/ polypectomy    . Lumbar fusion    . Pilonidal cyst removal    . Surgery scrotal / testicular    . Coronary artery bypass graft  2004  . Abdominal aortic aneurysm repair  11/2002  . Bi-ventricular pacemaker insertion (crt-p)  02-11-2013    Pt with previously implanted MDT CRTD downgraded to CRTP by Dr Lovena Le 02-11-13  . Back surgery    . Incision and drainage abscess / hematoma of bursa / knee / thigh    . Implantable cardioverter defibrillator (icd) generator change N/A 02/11/2013    Procedure: ICD GENERATOR CHANGE;  Surgeon: Evans Lance, MD;  Location: [facility] CATH LAB;  Service: Cardiovascular;  Laterality: N/A;  . Leg surgery Right     Social History   Social History  . Marital Status: Married    Spouse Name: N/A  . Number of Children: N/A  . Years of Education: N/A   Occupational History  . Retired- Nurse, mental health    Social History Main Topics  . Smoking status: Former Smoker -- 3.00 packs/day for 39 years    Types: Cigarettes    Start date: 05/24/1956    Quit date: 03/22/1995  . Smokeless tobacco: Never Used  Comment: quit smoking 18 years ago  . Alcohol Use: 1.2 oz/week    2 Cans of beer per week     Comment: beer  . Drug Use: No  . Sexual Activity: No   Other Topics Concern  . None   Social History Narrative    Family History  Problem Relation Age of  Onset  . Hypertension Mother   . Stroke Mother   . Diabetes Father   . Coronary artery disease Father   . Other Father     DVT  . Colon cancer Neg Hx   . Diabetes Brother   . Heart attack Neg Hx     Review of Systems  Constitutional: Negative for fever and chills.  Musculoskeletal: Positive for joint swelling and arthralgias.  Skin: Positive for color change (redness and ? warmth fourth finger).       Objective:   Filed Vitals:   06/15/15 1615  BP: 112/72  Pulse: 76  Temp: 98.3 F (36.8 C)  Resp: 16   Filed Weights   06/15/15 1615  Weight: 256 lb (116.121 kg)   Body mass index is 32.85 kg/(m^2).   Physical Exam  Constitutional: He appears well-developed and well-nourished. No distress.  Skin: He is not diaphoretic.  left fourth finger: swelling, redness, warm around PIP joint, decreased ROM - flexion, pain with movement and at rest      Assessment & Plan:   See Problem List for Assessment and Plan of chronic medical problems.

## 2015-06-15 NOTE — Progress Notes (Signed)
Pre visit review using our clinic review tool, if applicable. No additional management support is needed unless otherwise documented below in the visit note. 

## 2015-06-15 NOTE — Patient Instructions (Signed)

## 2015-06-15 NOTE — Assessment & Plan Note (Signed)
He has a history of gout and his symptoms and exam findings are consistent with gout His last flare was a long time ago so we will not change his allopurinol dose given his CKD Check uric acid at next blood draw Start colchicine two tabs today and 1 daily until symptoms resolve If swelling does not improve he may need his wedding ring cut off If no improvement in the next 48 hrs he will call Will soak finger and ice

## 2015-06-16 ENCOUNTER — Telehealth: Payer: Self-pay | Admitting: Interventional Cardiology

## 2015-06-16 ENCOUNTER — Other Ambulatory Visit: Payer: Self-pay

## 2015-06-16 DIAGNOSIS — I5022 Chronic systolic (congestive) heart failure: Secondary | ICD-10-CM

## 2015-06-16 NOTE — Addendum Note (Signed)
Addended by: Terence Lux B on: 06/16/2015 04:18 PM   Modules accepted: Medications

## 2015-06-16 NOTE — Telephone Encounter (Signed)
Follow Up ° ° ° ° °Pt is returning call from earlier. Please call. °

## 2015-06-16 NOTE — Telephone Encounter (Signed)
**Note De-Identified Shaul Trautman Obfuscation** The pts wife has been given the pts lab results and she verbalized understanding and states that she will notify the pt of his results.

## 2015-06-22 ENCOUNTER — Other Ambulatory Visit (INDEPENDENT_AMBULATORY_CARE_PROVIDER_SITE_OTHER): Payer: Medicare Other | Admitting: *Deleted

## 2015-06-22 DIAGNOSIS — I5022 Chronic systolic (congestive) heart failure: Secondary | ICD-10-CM | POA: Diagnosis not present

## 2015-06-22 LAB — BASIC METABOLIC PANEL
BUN: 43 mg/dL — AB (ref 7–25)
CO2: 28 mmol/L (ref 20–31)
Calcium: 9 mg/dL (ref 8.6–10.3)
Chloride: 100 mmol/L (ref 98–110)
Creat: 1.71 mg/dL — ABNORMAL HIGH (ref 0.70–1.18)
GLUCOSE: 148 mg/dL — AB (ref 65–99)
POTASSIUM: 3.4 mmol/L — AB (ref 3.5–5.3)
Sodium: 140 mmol/L (ref 135–146)

## 2015-06-25 ENCOUNTER — Telehealth: Payer: Self-pay | Admitting: Interventional Cardiology

## 2015-06-25 NOTE — Telephone Encounter (Signed)
New message  Pt returned the call

## 2015-06-25 NOTE — Telephone Encounter (Signed)
**Note De-identified Maverick Dieudonne Obfuscation** LMTCB

## 2015-06-26 ENCOUNTER — Telehealth: Payer: Self-pay | Admitting: Internal Medicine

## 2015-06-26 ENCOUNTER — Encounter: Payer: Self-pay | Admitting: Internal Medicine

## 2015-06-26 ENCOUNTER — Ambulatory Visit (INDEPENDENT_AMBULATORY_CARE_PROVIDER_SITE_OTHER): Payer: Medicare Other | Admitting: Internal Medicine

## 2015-06-26 VITALS — BP 128/78 | HR 81 | Temp 98.0°F | Resp 16 | Wt 259.0 lb

## 2015-06-26 DIAGNOSIS — L02512 Cutaneous abscess of left hand: Secondary | ICD-10-CM

## 2015-06-26 MED ORDER — DOXYCYCLINE HYCLATE 100 MG PO TABS
100.0000 mg | ORAL_TABLET | Freq: Two times a day (BID) | ORAL | Status: DC
Start: 2015-06-26 — End: 2015-07-15

## 2015-06-26 NOTE — Telephone Encounter (Signed)
Spoke with pts wife, appt scheduled for 245 today

## 2015-06-26 NOTE — Patient Instructions (Signed)
  Soak your finger twice a day.  Monitor it closely for infection.  If you have any redness or swelling in the next 24 hrs start the antibiotic I sent to your pharmacy (doxycyline).  This does interfere with your warfarin so monitor for increased bleeding.  Call with any questions or concerns.

## 2015-06-26 NOTE — Telephone Encounter (Signed)
If he can get here before noon I can see him, if not he should see greg

## 2015-06-26 NOTE — Telephone Encounter (Signed)
Please advise if pt needs to be seen today.

## 2015-06-26 NOTE — Progress Notes (Signed)
Pre visit review using our clinic review tool, if applicable. No additional management support is needed unless otherwise documented below in the visit note. 

## 2015-06-26 NOTE — Progress Notes (Signed)
Subjective:    Patient ID: [name], male    DOB: Dec 29, 1940, 75 y.o.   MRN: [id number]  HPI He is here for follow-up of his finger.  He was seen on 3/27 and he appeared to have gout. His left fourth finger was red and swollen. It was also warm to the touch and tender. His symptoms expanded and above and below his PIP joint, but the central area of focus was the joint. He is a history of gout, but never experienced in his hand. He has been taking the allopurinol daily. He did have some increased seafood intake, but it was several days prior to symptoms starting. I placed him on colchicine, which she has taken in the past and it has always been effective. He comes in today stating that he is still having symptoms.  He is an area of swelling, tenderness and redness distal to the PIP joint. There is no swelling, tenderness or redness in the PIP joint more proximally. He states that pain is related to the pressure inside the finger. He denies any fever. He denies any numbness or tingling in the finger. He does have decreased range of motion of the distal part of the finger. He was soaking the finger, but it stopped helping and he has not been doing it.   Medications and allergies reviewed with patient and updated if appropriate.  Patient Active Problem List   Diagnosis Date Noted  . Chronic systolic heart failure ([place]) 02/24/2015  . Polycythemia vera ([place]) 07/28/2014  . Ascending aortic aneurysm ([place]) 05/10/2014  . Hypoxia 05/05/2014  . COPD exacerbation ([place]) 05/05/2014  . Diabetes ([place]) 05/05/2014  . Chronic systolic CHF (congestive heart failure) ([place]) 03/07/2014  . History of DVT (deep vein thrombosis) 03/07/2014  . Cardiomyopathy, ischemic 02/05/2014  . Pleural plaque without asbestosis 01/23/2014  . Elevated hemoglobin ([place]) 01/14/2014  . CKD (chronic kidney disease) stage 3, GFR 30-59 ml/min 01/14/2014  . Asbestos exposure 01/14/2014  . Shortness of breath 01/01/2014  .  Abscess, scrotum 03/05/2013  . Ejection fraction   . Hypopotassemia 12/20/2012  . ACE-inhibitor cough 10/04/2012  . Ventral hernia   . Bony abnormality   . Carotid artery disease ([place])   . Spinal cord stimulator status   . Peripheral vascular disease with claudication ([place]) 08/31/2011  . Atrial fibrillation ([place])   . Spinal stenosis   . CAD (coronary artery disease)   . Venous insufficiency   . Mitral regurgitation   . Aortic valve sclerosis   . Alcohol ingestion of more than four drinks per week   . Chronotropic incompetence   . Thrombophlebitis of superficial veins of upper extremities   . Pericardial effusion   . Overweight(278.02)   . Pleural effusion   . Hx of allergic drug reaction   . Hyperlipidemia   . Hypertension   . Atrial flutter ([place])   . Left atrial thrombus ([place])   . AAA (abdominal aortic aneurysm) ([place])   . S/P ICD (internal cardiac defibrillator) procedure   . Warfarin anticoagulation   . Diabetic polyneuropathy ([place]) 01/07/2010  . DIVERTICULOSIS, COLON 06/10/2009  . Uncontrolled diabetes mellitus with peripheral circulatory disorder ([place]) 03/11/2009  . BACK PAIN, LUMBAR 06/12/2008  . 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 Acute gout 07/27/2006  . CORONARY ARTERY BYPASS GRAFT, HX OF 07/27/2006    Current Outpatient Prescriptions on File Prior to Visit  Medication Sig Dispense Refill  . ACCU-CHEK FASTCLIX LANCETS MISC USE ONE  TO CHECK BLOOD SUGAR THREE TIMES DAILY 102 each 5  . acetaminophen (TYLENOL) 325 MG tablet Take 325 mg by mouth every morning.     Marland Kitchen allopurinol (ZYLOPRIM) 100 MG tablet TAKE ONE TABLET BY MOUTH ONCE DAILY 90 tablet 0  . amLODipine (NORVASC) 5 MG tablet Take 2.5 mg by mouth daily.    Marland Kitchen aspirin 81 MG tablet Take 81 mg by mouth once a week.     Marland Kitchen atorvastatin (LIPITOR) 10 MG tablet TAKE ONE TABLET BY MOUTH ONCE DAILY 90 tablet 2  . carboxymethylcellulose (REFRESH PLUS) 0.5 % SOLN Place 2 drops into both eyes daily.      . carvedilol (COREG) 6.25 MG tablet TAKE ONE TABLET BY MOUTH TWICE DAILY 180 tablet 0  . chlorthalidone (HYGROTON) 25 MG tablet Take 1 tablet daily as needed when weight reaches 246 lbs. or greater only (Patient taking differently: Take 25 mg by mouth daily as needed (when weight reaches 246 lbs or greater only). ) 45 tablet 3  . Cholecalciferol 10000 UNITS CAPS Take 1,000 Units by mouth daily.    . colchicine 0.6 MG tablet Take 1 tablet (0.6 mg total) by mouth daily. 20 tablet 0  . furosemide (LASIX) 80 MG tablet Take 1 tablet (80 mg total) by mouth 2 (two) times daily. 180 tablet 3  . gabapentin (NEURONTIN) 300 MG capsule Take 300 mg by mouth 4 (four) times daily. LEG PAIN    . glucose blood (ACCU-CHEK AVIVA PLUS) test strip Use as instructed to check blood sugar 3 times per day dx code E11.49 100 each 5  . Insulin Glargine (TOUJEO SOLOSTAR) 300 UNIT/ML SOPN Inject 40 Units into the skin daily. 4.5 mL 3  . insulin lispro (HUMALOG KWIKPEN) 100 UNIT/ML KiwkPen Inject 0.08-0.12 mLs (8-12 Units total) into the skin 2 (two) times daily. (Patient taking differently: Inject 14 Units into the skin 2 (two) times daily. Take 14U before lunch Take 18 U before Dinner.) 5 pen 3  . ipratropium (ATROVENT) 0.06 % nasal spray Place 1 spray into both nostrils daily.     Marland Kitchen losartan (COZAAR) 50 MG tablet Take 1 tablet (50 mg total) by mouth daily. 90 tablet 3  . naphazoline-pheniramine (NAPHCON-A) 0.025-0.3 % ophthalmic solution Place 1 drop into both eyes daily.     . nitroGLYCERIN (NITROSTAT) 0.4 MG SL tablet Place 1 tablet (0.4 mg total) under the tongue every 5 (five) minutes as needed for chest pain (MAX 3 TABLETS). 25 tablet 3  . potassium chloride SA (K-DUR,KLOR-CON) 20 MEQ tablet Take 30 mEq by mouth 2 (two) times daily. TAKE 1.5 TWICE DAILY TO TOTAL 30 MEQ    . warfarin (COUMADIN) 2.5 MG tablet TAKE 2.5 MG BY MOUTH DAILY EXCEPT WEDNESDAY AND TAKE 5 MG 130 tablet 0   No current facility-administered  medications on file prior to visit.    Past Medical History  Diagnosis Date  . Diabetes mellitus   . Hyperlipidemia   . Hypertension   . Pilonidal cyst   . Atrial fibrillation ([place])     Previous long-term amiodarone therapy with multiple cardioversions / amiodarone stopped September, 2009  . Atrial flutter [facility])     Started November, 2010, Left-sided and cannot ablate  . Left atrial thrombus [facility])     Remote past... cardioversions done since that time  . Wide-complex tachycardia ([place])   . Left ventricular ejection fraction less than 40%   . Gout   . AAA (abdominal aortic aneurysm) ([place])  Surgical repair  . Discolored skin   . S/P ICD (internal cardiac defibrillator) procedure     Dr. Lovena Le 2009... by the pacing  . SOB (shortness of breath)     Large left effusion/ thoracentesis/hospitalization/November, 2011... exudated.. cytology negative.. Dr.Wert.. no proof of mesothelioma  . Pericardial effusion   . Pleural effusion     Large loculated effusion on the left side November, 2011. This was tapped. It was exudative. Cytology revealed no cancer no proof of mesothelioma area pulmonary team felt that no further workup was needed  . S/P AAA repair   . Spinal stenosis     Surgery Dr.Elsner  . CAD (coronary artery disease)     Catheterization July, 2008... name and vein grafts patent but low cardiac output  . Warfarin anticoagulation   . Cardiomyopathy     Ischemic... ICD  . Combined systolic and diastolic CHF   . Venous insufficiency     Toe discoloration chronic  . Mitral regurgitation     Mild echo  . Aortic valve sclerosis   . Nasal drainage     Chronic  . Alcohol ingestion of more than four drinks per week     Excess beer  not a dependency problem  . Chronotropic incompetence     IV pacing rate adjusted  . Thrombophlebitis of superficial veins of upper extremities     Possible venous stenosis from defibrillator  . Pericardial effusion     November, 2011 ..  decreased during hospitalization  . Eye abnormality     Ophthalmologist questions a clot in one of his eyes, May, 2012  . Overweight(278.02)     November, 2012  . Pleural thickening   . Ejection fraction     Ejection fraction has varied over time from 35-50%.,, Echoes are technically very difficult,,, EF 50%, echo, May 25, 2011, technically very difficult  . Drug therapy     Redness and swelling with Avelox infusion May 24, 2011  . COPD (chronic obstructive pulmonary disease) ([place])   . Myocardial infarction ([place])   . Spinal cord stimulator status     October, 2013  . Carotid artery disease ([place])     Doppler, December, 2013, 0-39% bilateral  . Pneumonia   . Arthritis   . Ventral hernia     April, 2014, result of his abdominal surgery  . Bony abnormality     Patient's manubrium is slightly displaced to the right  . Ascending aortic aneurysm ([place]) 05/10/2014  . Polycythemia vera ([place]) 07/28/2014  . Internal hemorrhoids   . Diverticulosis   . Adenomatous colon polyp     Past Surgical History  Procedure Laterality Date  . Colonoscopy w/ polypectomy    . Lumbar fusion    . Pilonidal cyst removal    . Surgery scrotal / testicular    . Coronary artery bypass graft  2004  . Abdominal aortic aneurysm repair  11/2002  . Bi-ventricular pacemaker insertion (crt-p)  02-11-2013    Pt with previously implanted MDT CRTD downgraded to CRTP by Dr Lovena Le 02-11-13  . Back surgery    . Incision and drainage abscess / hematoma of bursa / knee / thigh    . Implantable cardioverter defibrillator (icd) generator change N/A 02/11/2013    Procedure: ICD GENERATOR CHANGE;  Surgeon: Evans Lance, MD;  Location[facility] CATH LAB;  Service: Cardiovascular;  Laterality: N/A;  . Leg surgery Right     Social History   Social History  . Marital Status: Married  Spouse Name: N/A  . Number of Children: N/A  . Years of Education: N/A   Occupational History  . Retired- Nurse, mental health     Social History Main Topics  . Smoking status: Former Smoker -- 3.00 packs/day for 39 years    Types: Cigarettes    Start date: 05/24/1956    Quit date: 03/22/1995  . Smokeless tobacco: Never Used     Comment: quit smoking 18 years ago  . Alcohol Use: 1.2 oz/week    2 Cans of beer per week     Comment: beer  . Drug Use: No  . Sexual Activity: No   Other Topics Concern  . None   Social History Narrative    Family History  Problem Relation Age of Onset  . Hypertension Mother   . Stroke Mother   . Diabetes Father   . Coronary artery disease Father   . Other Father     DVT  . Colon cancer Neg Hx   . Diabetes Brother   . Heart attack Neg Hx     Review of Systems See history of present illness    Objective:   Filed Vitals:   06/26/15 1454  BP: 128/78  Pulse: 81  Temp: 98 F (36.7 C)  Resp: 16   Filed Weights   06/26/15 1454  Weight: 259 lb (117.482 kg)   Body mass index is 33.24 kg/(m^2).   Physical Exam  Constitutional: He appears well-developed and well-nourished. No distress.  Skin: He is not diaphoretic.  Left fourth finger: Swelling between. PIP and DIP joint, tenderness and very mild redness in that area. No tenderness or swelling in the PIP joint more proximally. Slight decreased range of motion of the finger secondary to the swelling. Normal sensation. Area of fluctuance on the posterior surface of the finger distal to the PIP joint          Assessment & Plan:   Abscess, left finger After cleaning the affected area with Betadine and alcohol, no meeting with lidocaine small incision was made with a scalpel and a moderate amount of pus was extracted. There is minimal bleeding and he tolerated the procedure well. He did feel decreased pain and relief after having the finger drained He will continue to soak the finger twice daily Advised that he may have some slight bleeding and drainage, but that is okay Monitor closely for infection-the finger  does not appear to have significant cellulitis and since it was drained we may be able to avoid antibiotics. He is on warfarin and an antibiotic will conflict I did give them a prescription for an antibiotic and they will monitor closely over the next 24 hours. If there are any sites of swelling, redness or increased pain they will start the antibiotic and monitor closely for increased bleeding since he is on warfarin They will call with any questions or concerns They will apply Neosporin to the finger and keep a Band-Aid on it

## 2015-06-26 NOTE — Telephone Encounter (Signed)
States gout in finger is not any better.  Is requesting Burns to work patient in.  States Dr. Quay Burow told patient to call back if not better.  Patient is not interested in seeing another provider today.

## 2015-06-30 NOTE — Telephone Encounter (Signed)
**Note De-identified Johnasia Liese Obfuscation** The pts wife called to report the pts weekly weights. Noted. 

## 2015-07-15 ENCOUNTER — Ambulatory Visit (INDEPENDENT_AMBULATORY_CARE_PROVIDER_SITE_OTHER): Payer: Medicare Other | Admitting: Endocrinology

## 2015-07-15 ENCOUNTER — Ambulatory Visit (INDEPENDENT_AMBULATORY_CARE_PROVIDER_SITE_OTHER): Payer: Medicare Other | Admitting: Internal Medicine

## 2015-07-15 ENCOUNTER — Encounter: Payer: Self-pay | Admitting: Endocrinology

## 2015-07-15 ENCOUNTER — Ambulatory Visit (INDEPENDENT_AMBULATORY_CARE_PROVIDER_SITE_OTHER): Payer: Medicare Other | Admitting: *Deleted

## 2015-07-15 VITALS — BP 118/64 | HR 72 | Temp 97.9°F | Resp 16 | Wt 274.0 lb

## 2015-07-15 VITALS — BP 128/82 | HR 73 | Temp 97.6°F | Resp 14 | Ht 74.0 in | Wt 254.4 lb

## 2015-07-15 DIAGNOSIS — E1165 Type 2 diabetes mellitus with hyperglycemia: Secondary | ICD-10-CM

## 2015-07-15 DIAGNOSIS — M4800 Spinal stenosis, site unspecified: Secondary | ICD-10-CM | POA: Diagnosis not present

## 2015-07-15 DIAGNOSIS — IMO0002 Reserved for concepts with insufficient information to code with codable children: Secondary | ICD-10-CM

## 2015-07-15 DIAGNOSIS — I10 Essential (primary) hypertension: Secondary | ICD-10-CM

## 2015-07-15 DIAGNOSIS — Z7901 Long term (current) use of anticoagulants: Secondary | ICD-10-CM | POA: Diagnosis not present

## 2015-07-15 DIAGNOSIS — Z794 Long term (current) use of insulin: Secondary | ICD-10-CM | POA: Diagnosis not present

## 2015-07-15 DIAGNOSIS — I255 Ischemic cardiomyopathy: Secondary | ICD-10-CM | POA: Diagnosis not present

## 2015-07-15 DIAGNOSIS — E785 Hyperlipidemia, unspecified: Secondary | ICD-10-CM

## 2015-07-15 DIAGNOSIS — D45 Polycythemia vera: Secondary | ICD-10-CM | POA: Diagnosis not present

## 2015-07-15 DIAGNOSIS — I5022 Chronic systolic (congestive) heart failure: Secondary | ICD-10-CM

## 2015-07-15 DIAGNOSIS — I4891 Unspecified atrial fibrillation: Secondary | ICD-10-CM | POA: Diagnosis not present

## 2015-07-15 DIAGNOSIS — E1151 Type 2 diabetes mellitus with diabetic peripheral angiopathy without gangrene: Secondary | ICD-10-CM

## 2015-07-15 LAB — POCT INR: INR: 2.3

## 2015-07-15 MED ORDER — WARFARIN SODIUM 2.5 MG PO TABS: 2.5000 mg | ORAL_TABLET | ORAL | Status: DC

## 2015-07-15 MED ORDER — COLCHICINE 0.6 MG PO TABS: 0.6000 mg | ORAL_TABLET | Freq: Every day | ORAL | Status: DC

## 2015-07-15 NOTE — Progress Notes (Signed)
Patient ID: [name], male   DOB: 1940-10-14, 75 y.o.   MRN: [id number]    Reason for Appointment : F/u for Type 2 Diabetes  History of Present Illness          Diagnosis: Type 2 diabetes mellitus, date of diagnosis: 2008       Past history: He was initially treated with Metformin and also given Amayl at some point. With this he had fair control of his diabetes with A1c had usually been over 7% except once in 2013.  He was taken off metformin probably in March this year after a hospitalization, possibly because of renal dysfunction He was started on Levemir insulin in May when his blood sugars were significantly higher, fasting readings averaging 220.  Levemir progressively increased and was on 35 units at night since 12/20/12 He has not been on any other medications either orally or injectable for his diabetes Because of his A1c of over 10% in 10/14 on basal insulin alone he was started on NovoLog with each meal in 10/14  Recent history:     INSULIN regimen is described as: Toujeo 40 q am Humalog 14 lunch-- 18 units ac supper  His blood sugars were higher prior to visit in 317 With switching from Levemir to Toujeo his blood sugars are significantly better Unable to take metformin because of renal dysfunction    A1c is slightly higher as expected at 7.9 in 3/17    Current blood sugar patterns and problems identified:   His fasting blood sugars are excellent with a normal readings.  He is taking Toujeo once a day.  He is concerned about the cost of Toujeo but he wants to continue this for now  His blood sugars did go up for a few days after getting a steroid injection  He has had relatively low readings after his first meal at lunch and also once felt hypoglycemic before lunch  Glucose readings after supper are somewhat variable but recently not over 170  His weight has stayed about same  Compliance with the medical regimen: Good   Glucose monitoring:   done  Up to 3  times a day        Glucometer:  Accu-Chek    Blood Glucose readings from download as follows  Mean values apply above for all meters except median for One Touch  PRE-MEAL Fasting PC Lunch PC Dinner Bedtime Overall  Glucose range: 93-143  96-179  122-170     Mean/median: 113 128  148 129    Self-care: The diet that the patient has been following is: Smaller portions and relatively balanced meals  Meals: 2 meals per day. Noon and 6 pm  Low fat, trying to eat more salads. Avoiding drinks with sugar and no juices       Physical activity: exercise: Minimal, limited by back pain           Dietician visit: Most recent: Unknown.  CDE visit: 11/14                  Weight control: This appears to be fluctuating significantly  Wt Readings from Last 3 Encounters:  07/15/15 274 lb (124.286 kg)  07/15/15 254 lb 6.4 oz (115.395 kg)  06/26/15 259 lb (117.482 kg)    Lab Results  Component Value Date   HGBA1C 7.9* 06/09/2015   HGBA1C 7.2* 03/03/2015   HGBA1C 6.7* 12/02/2014   Lab Results  Component Value Date   MICROALBUR 1.6  09/02/2014   LDLCALC 90 09/02/2014   CREATININE 1.71* 06/22/2015       Medication List       This list is accurate as of: 07/15/15 11:59 PM.  Always use your most recent med list.               ACCU-CHEK FASTCLIX LANCETS Misc  USE ONE  TO CHECK BLOOD SUGAR THREE TIMES DAILY     acetaminophen 325 MG tablet  Commonly known as:  TYLENOL  Take 325 mg by mouth every morning.     allopurinol 100 MG tablet  Commonly known as:  ZYLOPRIM  TAKE ONE TABLET BY MOUTH ONCE DAILY     amLODipine 5 MG tablet  Commonly known as:  NORVASC  Take 2.5 mg by mouth daily.     aspirin 81 MG tablet  Take 81 mg by mouth once a week.     atorvastatin 10 MG tablet  Commonly known as:  LIPITOR  TAKE ONE TABLET BY MOUTH ONCE DAILY     carboxymethylcellulose 0.5 % Soln  Commonly known as:  REFRESH PLUS  Place 2 drops into both eyes daily.     carvedilol  6.25 MG tablet  Commonly known as:  COREG  TAKE ONE TABLET BY MOUTH TWICE DAILY     chlorthalidone 25 MG tablet  Commonly known as:  HYGROTON  Take 1 tablet daily as needed when weight reaches 246 lbs. or greater only     Cholecalciferol 10000 units Caps  Take 1,000 Units by mouth daily.     colchicine 0.6 MG tablet  Take 1 tablet (0.6 mg total) by mouth daily.     furosemide 80 MG tablet  Commonly known as:  LASIX  Take 1 tablet (80 mg total) by mouth 2 (two) times daily.     gabapentin 300 MG capsule  Commonly known as:  NEURONTIN  Take 300 mg by mouth 4 (four) times daily. LEG PAIN     glucose blood test strip  Commonly known as:  ACCU-CHEK AVIVA PLUS  Use as instructed to check blood sugar 3 times per day dx code E11.49     Insulin Glargine 300 UNIT/ML Sopn  Commonly known as:  TOUJEO SOLOSTAR  Inject 40 Units into the skin daily.     insulin lispro 100 UNIT/ML KiwkPen  Commonly known as:  HUMALOG KWIKPEN  Inject 0.08-0.12 mLs (8-12 Units total) into the skin 2 (two) times daily.     ipratropium 0.06 % nasal spray  Commonly known as:  ATROVENT  Place 1 spray into both nostrils daily.     losartan 50 MG tablet  Commonly known as:  COZAAR  Take 1 tablet (50 mg total) by mouth daily.     naphazoline-pheniramine 0.025-0.3 % ophthalmic solution  Commonly known as:  NAPHCON-A  Place 1 drop into both eyes daily.     nitroGLYCERIN 0.4 MG SL tablet  Commonly known as:  NITROSTAT  Place 1 tablet (0.4 mg total) under the tongue every 5 (five) minutes as needed for chest pain (MAX 3 TABLETS).     potassium chloride SA 20 MEQ tablet  Commonly known as:  K-DUR,KLOR-CON  Take 30 mEq by mouth 2 (two) times daily. TAKE 1.5 TWICE DAILY TO TOTAL 30 MEQ     warfarin 2.5 MG tablet  Commonly known as:  COUMADIN  Take 1 tablet (2.5 mg total) by mouth as directed.        Allergies:  Allergies  Allergen Reactions  .  Avelox [Moxifloxacin Hcl In Nacl] Swelling, Rash and Other  (See Comments)    Patient became hypotensive after infusion started Because of a history of documented adverse serious drug reaction;Medi Alert bracelet  is recommended  . Penicillins Anaphylaxis, Other (See Comments) and Swelling    REACTION: anaphylaxis Because of a history of documented adverse serious drug reaction;Medi Alert bracelet  is recommended    Past Medical History  Diagnosis Date  . Diabetes mellitus   . Hyperlipidemia   . Hypertension   . Pilonidal cyst   . Atrial fibrillation ([place])     Previous long-term amiodarone therapy with multiple cardioversions / amiodarone stopped September, 2009  . Atrial flutter [facility])     Started November, 2010, Left-sided and cannot ablate  . Left atrial thrombus [facility])     Remote past... cardioversions done since that time  . Wide-complex tachycardia ([place])   . Left ventricular ejection fraction less than 40%   . Gout   . AAA (abdominal aortic aneurysm) [facility])     Surgical repair  . Discolored skin   . S/P ICD (internal cardiac defibrillator) procedure     Dr. Lovena Le 2009... by the pacing  . SOB (shortness of breath)     Large left effusion/ thoracentesis/hospitalization/November, 2011... exudated.. cytology negative.. Dr.Wert.. no proof of mesothelioma  . Pericardial effusion   . Pleural effusion     Large loculated effusion on the left side November, 2011. This was tapped. It was exudative. Cytology revealed no cancer no proof of mesothelioma area pulmonary team felt that no further workup was needed  . S/P AAA repair   . Spinal stenosis     Surgery Dr.Elsner  . CAD (coronary artery disease)     Catheterization July, 2008... name and vein grafts patent but low cardiac output  . Warfarin anticoagulation   . Cardiomyopathy     Ischemic... ICD  . Combined systolic and diastolic CHF   . Venous insufficiency     Toe discoloration chronic  . Mitral regurgitation     Mild echo  . Aortic valve sclerosis   . Nasal drainage     Chronic   . Alcohol ingestion of more than four drinks per week     Excess beer  not a dependency problem  . Chronotropic incompetence     IV pacing rate adjusted  . Thrombophlebitis of superficial veins of upper extremities     Possible venous stenosis from defibrillator  . Pericardial effusion     November, 2011 .. decreased during hospitalization  . Eye abnormality     Ophthalmologist questions a clot in one of his eyes, May, 2012  . Overweight(278.02)     November, 2012  . Pleural thickening   . Ejection fraction     Ejection fraction has varied over time from 35-50%.,, Echoes are technically very difficult,,, EF 50%, echo, May 25, 2011, technically very difficult  . Drug therapy     Redness and swelling with Avelox infusion May 24, 2011  . COPD (chronic obstructive pulmonary disease) ([place])   . Myocardial infarction ([place])   . Spinal cord stimulator status     October, 2013  . Carotid artery disease ([place])     Doppler, December, 2013, 0-39% bilateral  . Pneumonia   . Arthritis   . Ventral hernia     April, 2014, result of his abdominal surgery  . Bony abnormality     Patient's manubrium is slightly displaced to the right  . Ascending aortic aneurysm (  [place]) 05/10/2014  . Polycythemia vera ([place]) 07/28/2014  . Internal hemorrhoids   . Diverticulosis   . Adenomatous colon polyp     Past Surgical History  Procedure Laterality Date  . Colonoscopy w/ polypectomy    . Lumbar fusion    . Pilonidal cyst removal    . Surgery scrotal / testicular    . Coronary artery bypass graft  2004  . Abdominal aortic aneurysm repair  11/2002  . Bi-ventricular pacemaker insertion (crt-p)  02-11-2013    Pt with previously implanted MDT CRTD downgraded to CRTP by Dr Lovena Le 02-11-13  . Back surgery    . Incision and drainage abscess / hematoma of bursa / knee / thigh    . Implantable cardioverter defibrillator (icd) generator change N/A 02/11/2013    Procedure: ICD GENERATOR CHANGE;  Surgeon: Evans Lance, MD;  Location: [facility] CATH LAB;  Service: Cardiovascular;  Laterality: N/A;  . Leg surgery Right     Family History  Problem Relation Age of Onset  . Hypertension Mother   . Stroke Mother   . Diabetes Father   . Coronary artery disease Father   . Other Father     DVT  . Colon cancer Neg Hx   . Diabetes Brother   . Heart attack Neg Hx     Social History:  reports that he quit smoking about 20 years ago. His smoking use included Cigarettes. He started smoking about 59 years ago. He has a 117 pack-year smoking history. He has never used smokeless tobacco. He reports that he drinks about 1.2 oz of alcohol per week. He reports that he does not use illicit drugs.    Review of Systems       Lipids: Treated with Lipitor  only taking 10 mg, Followed by PCP and cardiologist Hyperlipidemia: LDL is under 100 but not under 70     Lab Results  Component Value Date   CHOL 152 09/02/2014   HDL 40.00 09/02/2014   LDLCALC 90 09/02/2014   TRIG 112.0 09/02/2014   CHOLHDL 4 09/02/2014       The blood pressure has been under control with current regimen of chlorthalidone, amlodipine and losartan Also he takes extra chlorthalidone when he has weight gain  Renal dysfunction: Creatinine has been quite variable but now persistently high   Lab Results  Component Value Date   CREATININE 1.71* 06/22/2015   BUN 43* 06/22/2015   NA 140 06/22/2015   K 3.4* 06/22/2015   CL 100 06/22/2015   CO2 28 06/22/2015      Has known neuropathy: He has had pains and tingling in his feet and lower legs.  Mostly having some symptoms in day;  some early morning and during the night.  Takes gabapentin for this and extra tablet at bedtime  Requires phlebotomy for polycythemia periodically     Physical Examination:  BP 128/82 mmHg  Pulse 73  Temp(Src) 97.6 F (36.4 C) (Oral)  Resp 14  Ht 6\' 2"  (1.88 m)  Wt 254 lb 6.4 oz (115.395 kg)  BMI 32.65 kg/m2  SpO2 94%     ASSESSMENT/PLAN:  Diabetes  type 2 See history of present illness for detailed discussion of his current management, blood sugar patterns and problems identified  His blood sugars have been Much better with switching Levemir to Toujeo and he is not able to take this once a day Fasting readings are consistently Normal Since his blood sugars are occasionally low normal after lunch he  can reduce his Humalog at that time to 12 minutes Again he can adjust his suppertime dose based on his meal size Continue 40 units of Toujeo but can stop taking his morning sugar as frequently  A1c on the next visit  Patient Instructions  Humalog at lunch 12 units  Extra 4-6 units when getting steroid or any illness  Check blood sugars on waking up 4-5  times a week Also check blood sugars about 2 hours after a meal and do this after different meals by rotation  Recommended blood sugar levels on waking up is 90-130 and about 2 hours after meal is 130-180  Please bring your blood sugar monitor to each visit, thank you      [facility] 07/16/2015, 7:57 AM

## 2015-07-15 NOTE — Progress Notes (Signed)
Pre visit review using our clinic review tool, if applicable. No additional management support is needed unless otherwise documented below in the visit note. 

## 2015-07-15 NOTE — Progress Notes (Signed)
Remote ICD transmission.   

## 2015-07-15 NOTE — Assessment & Plan Note (Signed)
BP well controlled Current regimen effective and well tolerated Continue current medications at current doses  

## 2015-07-15 NOTE — Assessment & Plan Note (Addendum)
Chronic back pain Has stimulator which has been very helpful Follows with Dr. Ellene Route - has gotten injections recently

## 2015-07-15 NOTE — Patient Instructions (Addendum)
Humalog at lunch 12 units  Extra 4-6 units when getting steroid or any illness  Check blood sugars on waking up 4-5  times a week Also check blood sugars about 2 hours after a meal and do this after different meals by rotation  Recommended blood sugar levels on waking up is 90-130 and about 2 hours after meal is 130-180  Please bring your blood sugar monitor to each visit, thank you

## 2015-07-15 NOTE — Patient Instructions (Signed)
    Medications reviewed and updated.  No changes recommended at this time.  Your prescription(s) have been submitted to your pharmacy. Please take as directed and contact our office if you believe you are having problem(s) with the medication(s).    Please followup in november

## 2015-07-15 NOTE — Progress Notes (Signed)
Subjective:    Patient ID: [name], male    DOB: 09/30/1940, 75 y.o.   MRN: [id number]  HPI He is here to establish with a new pcp.    Diabetes: He is taking his medication daily as prescribed. He follows with endocrine.  He is compliant with a diabetic diet. He is not exercising regularly due to back pain. He monitors his sugars and they have been running 120's.  He is up-to-date with an ophthalmology examination.   His finger infection has cleared.  He did need to take the antibiotic and got pus out of his finger for several days.  He currently denies any symptoms, except mild finger stiffness.  Hypertension, CAD, Afib: He follows with cardiology.  He is taking his medication daily. He is compliant with a low sodium diet.  He has chronic leg edema.  He denies chest pain, palpitations, shortness of breath and regular headaches. He is not exercising regularly.    Hyperlipidemia: He is taking his medication daily. He is compliant with a low fat/cholesterol diet. He is not exercising regularly.     Spinal stenosis, spinal cord stimulator:  He has chronic back pain and has had surgery in the past.  He has a spinal cord stimulator that has helped significantly with his pain.  Recently he has had pain in his lower sacral area and is following with his back specialist.  He had injections but it only provideded relief for a very short time period.  He walks with a cane.  He wishes he could be more active.   Only significant past history reviewed with him.    Medications and allergies reviewed with patient and updated if appropriate.  Patient Active Problem List   Diagnosis Date Noted  . Chronic systolic heart failure ([place]) 02/24/2015  . Polycythemia vera ([place]) 07/28/2014  . Ascending aortic aneurysm ([place]) 05/10/2014  . Hypoxia 05/05/2014  . COPD exacerbation ([place]) 05/05/2014  . Diabetes ([place]) 05/05/2014  . Chronic systolic CHF (congestive heart failure) ([place]) 03/07/2014  .  History of DVT (deep vein thrombosis) 03/07/2014  . Cardiomyopathy, ischemic 02/05/2014  . Pleural plaque without asbestosis 01/23/2014  . Elevated hemoglobin ([place]) 01/14/2014  . CKD (chronic kidney disease) stage 3, GFR 30-59 ml/min 01/14/2014  . Asbestos exposure 01/14/2014  . Shortness of breath 01/01/2014  . Abscess, scrotum 03/05/2013  . Ejection fraction   . Hypopotassemia 12/20/2012  . ACE-inhibitor cough 10/04/2012  . Ventral hernia   . Bony abnormality   . Carotid artery disease ([place])   . Spinal cord stimulator status   . Peripheral vascular disease with claudication ([place]) 08/31/2011  . Atrial fibrillation ([place])   . Spinal stenosis   . CAD (coronary artery disease)   . Venous insufficiency   . Mitral regurgitation   . Aortic valve sclerosis   . Alcohol ingestion of more than four drinks per week   . Chronotropic incompetence   . Thrombophlebitis of superficial veins of upper extremities   . Pericardial effusion   . Overweight(278.02)   . Pleural effusion   . Hx of allergic drug reaction   . Hyperlipidemia   . Hypertension   . Atrial flutter ([place])   . Left atrial thrombus ([place])   . AAA (abdominal aortic aneurysm) ([place])   . S/P ICD (internal cardiac defibrillator) procedure   . Warfarin anticoagulation   . Diabetic polyneuropathy ([place]) 01/07/2010  . DIVERTICULOSIS, COLON 06/10/2009  . Uncontrolled diabetes mellitus with peripheral circulatory disorder ([place])  03/11/2009  . BACK PAIN, LUMBAR 06/12/2008  . 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 Acute gout 07/27/2006  . CORONARY ARTERY BYPASS GRAFT, HX OF 07/27/2006    Current Outpatient Prescriptions on File Prior to Visit  Medication Sig Dispense Refill  . ACCU-CHEK FASTCLIX LANCETS MISC USE ONE  TO CHECK BLOOD SUGAR THREE TIMES DAILY 102 each 5  . acetaminophen (TYLENOL) 325 MG tablet Take 325 mg by mouth every morning.     Marland Kitchen allopurinol (ZYLOPRIM) 100 MG tablet TAKE ONE TABLET BY MOUTH ONCE  DAILY 90 tablet 0  . amLODipine (NORVASC) 5 MG tablet Take 2.5 mg by mouth daily.    Marland Kitchen aspirin 81 MG tablet Take 81 mg by mouth once a week.     Marland Kitchen atorvastatin (LIPITOR) 10 MG tablet TAKE ONE TABLET BY MOUTH ONCE DAILY 90 tablet 2  . carboxymethylcellulose (REFRESH PLUS) 0.5 % SOLN Place 2 drops into both eyes daily.     . carvedilol (COREG) 6.25 MG tablet TAKE ONE TABLET BY MOUTH TWICE DAILY 180 tablet 0  . chlorthalidone (HYGROTON) 25 MG tablet Take 1 tablet daily as needed when weight reaches 246 lbs. or greater only (Patient taking differently: Take 25 mg by mouth daily as needed (when weight reaches 246 lbs or greater only). ) 45 tablet 3  . Cholecalciferol 10000 UNITS CAPS Take 1,000 Units by mouth daily.    . colchicine 0.6 MG tablet Take 1 tablet (0.6 mg total) by mouth daily. (Patient taking differently: Take 0.6 mg by mouth daily. ) 20 tablet 0  . furosemide (LASIX) 80 MG tablet Take 1 tablet (80 mg total) by mouth 2 (two) times daily. 180 tablet 3  . gabapentin (NEURONTIN) 300 MG capsule Take 300 mg by mouth 4 (four) times daily. LEG PAIN    . glucose blood (ACCU-CHEK AVIVA PLUS) test strip Use as instructed to check blood sugar 3 times per day dx code E11.49 100 each 5  . Insulin Glargine (TOUJEO SOLOSTAR) 300 UNIT/ML SOPN Inject 40 Units into the skin daily. 4.5 mL 3  . insulin lispro (HUMALOG KWIKPEN) 100 UNIT/ML KiwkPen Inject 0.08-0.12 mLs (8-12 Units total) into the skin 2 (two) times daily. (Patient taking differently: Inject 14 Units into the skin 2 (two) times daily. Take 14U before lunch Take 18 U before Dinner.) 5 pen 3  . ipratropium (ATROVENT) 0.06 % nasal spray Place 1 spray into both nostrils daily.     Marland Kitchen losartan (COZAAR) 50 MG tablet Take 1 tablet (50 mg total) by mouth daily. 90 tablet 3  . naphazoline-pheniramine (NAPHCON-A) 0.025-0.3 % ophthalmic solution Place 1 drop into both eyes daily.     . nitroGLYCERIN (NITROSTAT) 0.4 MG SL tablet Place 1 tablet (0.4 mg total)  under the tongue every 5 (five) minutes as needed for chest pain (MAX 3 TABLETS). 25 tablet 3  . potassium chloride SA (K-DUR,KLOR-CON) 20 MEQ tablet Take 30 mEq by mouth 2 (two) times daily. TAKE 1.5 TWICE DAILY TO TOTAL 30 MEQ     No current facility-administered medications on file prior to visit.    Past Medical History  Diagnosis Date  . Diabetes mellitus   . Hyperlipidemia   . Hypertension   . Pilonidal cyst   . Atrial fibrillation ([place])     Previous long-term amiodarone therapy with multiple cardioversions / amiodarone stopped September, 2009  . Atrial flutter [facility])     Started November, 2010, Left-sided and cannot ablate  . Left atrial thrombus (  [place])     Remote past... cardioversions done since that time  . Wide-complex tachycardia ([place])   . Left ventricular ejection fraction less than 40%   . Gout   . AAA (abdominal aortic aneurysm) [facility])     Surgical repair  . Discolored skin   . S/P ICD (internal cardiac defibrillator) procedure     Dr. Lovena Le 2009... by the pacing  . SOB (shortness of breath)     Large left effusion/ thoracentesis/hospitalization/November, 2011... exudated.. cytology negative.. Dr.Wert.. no proof of mesothelioma  . Pericardial effusion   . Pleural effusion     Large loculated effusion on the left side November, 2011. This was tapped. It was exudative. Cytology revealed no cancer no proof of mesothelioma area pulmonary team felt that no further workup was needed  . S/P AAA repair   . Spinal stenosis     Surgery Dr.Elsner  . CAD (coronary artery disease)     Catheterization July, 2008... name and vein grafts patent but low cardiac output  . Warfarin anticoagulation   . Cardiomyopathy     Ischemic... ICD  . Combined systolic and diastolic CHF   . Venous insufficiency     Toe discoloration chronic  . Mitral regurgitation     Mild echo  . Aortic valve sclerosis   . Nasal drainage     Chronic  . Alcohol ingestion of more than four drinks per week      Excess beer  not a dependency problem  . Chronotropic incompetence     IV pacing rate adjusted  . Thrombophlebitis of superficial veins of upper extremities     Possible venous stenosis from defibrillator  . Pericardial effusion     November, 2011 .. decreased during hospitalization  . Eye abnormality     Ophthalmologist questions a clot in one of his eyes, May, 2012  . Overweight(278.02)     November, 2012  . Pleural thickening   . Ejection fraction     Ejection fraction has varied over time from 35-50%.,, Echoes are technically very difficult,,, EF 50%, echo, May 25, 2011, technically very difficult  . Drug therapy     Redness and swelling with Avelox infusion May 24, 2011  . COPD (chronic obstructive pulmonary disease) ([place])   . Myocardial infarction ([place])   . Spinal cord stimulator status     October, 2013  . Carotid artery disease ([place])     Doppler, December, 2013, 0-39% bilateral  . Pneumonia   . Arthritis   . Ventral hernia     April, 2014, result of his abdominal surgery  . Bony abnormality     Patient's manubrium is slightly displaced to the right  . Ascending aortic aneurysm ([place]) 05/10/2014  . Polycythemia vera ([place]) 07/28/2014  . Internal hemorrhoids   . Diverticulosis   . Adenomatous colon polyp     Past Surgical History  Procedure Laterality Date  . Colonoscopy w/ polypectomy    . Lumbar fusion    . Pilonidal cyst removal    . Surgery scrotal / testicular    . Coronary artery bypass graft  2004  . Abdominal aortic aneurysm repair  11/2002  . Bi-ventricular pacemaker insertion (crt-p)  02-11-2013    Pt with previously implanted MDT CRTD downgraded to CRTP by Dr Lovena Le 02-11-13  . Back surgery    . Incision and drainage abscess / hematoma of bursa / knee / thigh    . Implantable cardioverter defibrillator (icd) generator change N/A 02/11/2013  Procedure: ICD GENERATOR CHANGE;  Surgeon: Evans Lance, MD;  Location: [facility] CATH LAB;  Service:  Cardiovascular;  Laterality: N/A;  . Leg surgery Right     Social History   Social History  . Marital Status: Married    Spouse Name: N/A  . Number of Children: N/A  . Years of Education: N/A   Occupational History  . Retired- Nurse, mental health    Social History Main Topics  . Smoking status: Former Smoker -- 3.00 packs/day for 39 years    Types: Cigarettes    Start date: 05/24/1956    Quit date: 03/22/1995  . Smokeless tobacco: Never Used     Comment: quit smoking 18 years ago  . Alcohol Use: 1.2 oz/week    2 Cans of beer per week     Comment: beer  . Drug Use: No  . Sexual Activity: No   Other Topics Concern  . Not on file   Social History Narrative    Family History  Problem Relation Age of Onset  . Hypertension Mother   . Stroke Mother   . Diabetes Father   . Coronary artery disease Father   . Other Father     DVT  . Colon cancer Neg Hx   . Diabetes Brother   . Heart attack Neg Hx     Review of Systems  Constitutional: Negative for fever, chills and appetite change.  Respiratory: Negative for cough, shortness of breath and wheezing.   Cardiovascular: Positive for leg swelling (right > left). Negative for chest pain and palpitations.  Gastrointestinal: Negative for nausea and abdominal pain.       No gerd  Neurological: Positive for numbness (peripheral neuropathy in feet - n/t and pain). Negative for dizziness, light-headedness and headaches.       Objective:   Filed Vitals:   07/15/15 1420  BP: 118/64  Pulse: 72  Temp: 97.9 F (36.6 C)  Resp: 16   Filed Weights   07/15/15 1420  Weight: 274 lb (124.286 kg)   Body mass index is 35.16 kg/(m^2).   Physical Exam Constitutional: Appears well-developed and well-nourished. No distress.  Neck: Neck supple. No tracheal deviation present. No thyromegaly present.  No carotid bruit. No cervical adenopathy.   Cardiovascular: Normal rate, regular rhythm and normal heart sounds.   2/6 systolic  murmur heard.  1+ pitting edema Pulmonary/Chest: Effort normal and breath sounds normal. No respiratory distress. No wheezes.         Assessment & Plan:    See Problem List for Assessment and Plan of chronic medical problems.   F/u in November for PE

## 2015-07-15 NOTE — Assessment & Plan Note (Signed)
Following with endo Sugars controlled at home Management per Dr Dwyane Dee

## 2015-07-16 ENCOUNTER — Other Ambulatory Visit (HOSPITAL_BASED_OUTPATIENT_CLINIC_OR_DEPARTMENT_OTHER): Payer: Medicare Other

## 2015-07-16 ENCOUNTER — Encounter: Payer: Self-pay | Admitting: Internal Medicine

## 2015-07-16 ENCOUNTER — Ambulatory Visit (HOSPITAL_BASED_OUTPATIENT_CLINIC_OR_DEPARTMENT_OTHER): Payer: Medicare Other

## 2015-07-16 ENCOUNTER — Ambulatory Visit (HOSPITAL_BASED_OUTPATIENT_CLINIC_OR_DEPARTMENT_OTHER): Payer: Medicare Other | Admitting: Family

## 2015-07-16 ENCOUNTER — Encounter: Payer: Self-pay | Admitting: Family

## 2015-07-16 VITALS — BP 115/55 | HR 78 | Resp 16

## 2015-07-16 VITALS — BP 88/50 | HR 75 | Temp 97.4°F | Resp 16 | Ht 74.0 in | Wt 256.0 lb

## 2015-07-16 DIAGNOSIS — D45 Polycythemia vera: Secondary | ICD-10-CM

## 2015-07-16 DIAGNOSIS — D582 Other hemoglobinopathies: Secondary | ICD-10-CM

## 2015-07-16 LAB — CBC WITH DIFFERENTIAL (CANCER CENTER ONLY)
BASO#: 0 10*3/uL (ref 0.0–0.2)
BASO%: 0.4 % (ref 0.0–2.0)
EOS ABS: 0.1 10*3/uL (ref 0.0–0.5)
EOS%: 0.8 % (ref 0.0–7.0)
HCT: 49.1 % (ref 38.7–49.9)
HGB: 16.7 g/dL (ref 13.0–17.1)
LYMPH#: 1.1 10*3/uL (ref 0.9–3.3)
LYMPH%: 12.8 % — AB (ref 14.0–48.0)
MCH: 31.7 pg (ref 28.0–33.4)
MCHC: 34 g/dL (ref 32.0–35.9)
MCV: 93 fL (ref 82–98)
MONO#: 0.7 10*3/uL (ref 0.1–0.9)
MONO%: 8.2 % (ref 0.0–13.0)
NEUT#: 6.6 10*3/uL — ABNORMAL HIGH (ref 1.5–6.5)
NEUT%: 77.8 % (ref 40.0–80.0)
PLATELETS: 137 10*3/uL — AB (ref 145–400)
RBC: 5.27 10*6/uL (ref 4.20–5.70)
RDW: 15.4 % (ref 11.1–15.7)
WBC: 8.5 10*3/uL (ref 4.0–10.0)

## 2015-07-16 MED ORDER — SODIUM CHLORIDE 0.9 % IV SOLN
Freq: Once | INTRAVENOUS | Status: AC
  Administered 2015-07-16: 13:00:00 via INTRAVENOUS

## 2015-07-16 NOTE — Progress Notes (Signed)
Hematology and Oncology Follow Up Visit  [name] [id number] 01-Aug-1940 75 y.o. 07/16/2015   Principle Diagnosis:  Polycythemia vera  Current Therapy:   Phlebotomy to maintain hematocrit below 45%. Aspirin 81 mg by mouth daily    Interim History:  [name] is here today with his wife for a follow-up. He is doing well and has no complaints at this time. His BP is a low this morning at 88/50 with a HR of 75. He states that he took his BP medication this morning with breakfast. I retook his BP manually and is is now 100/50. He states that he was feeling a little tired earlier but is starting to feel better now.  No fever, rash, dizziness, headaches, blurred vision, chest pain, palpitations, abdominal pain or changes in bowel or bladder habits.  No lymphadenopathy found on exam. No episodes of bleeding. He is on coumadin daily for atrial fib and also takes 1 baby aspirin a week. He has noticed that he does bruise easily.  He has some swelling in his right ankle due to an old injury. This comes and goes. He has no other swelling and no tenderness in his extremities. The numbness and tingling in his feet is unchanged. No new aches or pains. No falls or syncopal episodes.  He has maintained a good appetite and is staying well hydrated. His weight is stable.   Medications:    Medication List       This list is accurate as of: 07/16/15 11:52 AM.  Always use your most recent med list.               ACCU-CHEK FASTCLIX LANCETS Misc  USE ONE  TO CHECK BLOOD SUGAR THREE TIMES DAILY     acetaminophen 325 MG tablet  Commonly known as:  TYLENOL  Take 325 mg by mouth every morning.     allopurinol 100 MG tablet  Commonly known as:  ZYLOPRIM  TAKE ONE TABLET BY MOUTH ONCE DAILY     amLODipine 5 MG tablet  Commonly known as:  NORVASC  Take 2.5 mg by mouth daily.     aspirin 81 MG tablet  Take 81 mg by mouth once a week.     atorvastatin 10 MG tablet  Commonly known as:   LIPITOR  TAKE ONE TABLET BY MOUTH ONCE DAILY     carboxymethylcellulose 0.5 % Soln  Commonly known as:  REFRESH PLUS  Place 2 drops into both eyes daily.     carvedilol 6.25 MG tablet  Commonly known as:  COREG  TAKE ONE TABLET BY MOUTH TWICE DAILY     chlorthalidone 25 MG tablet  Commonly known as:  HYGROTON  Take 1 tablet daily as needed when weight reaches 246 lbs. or greater only     Cholecalciferol 10000 units Caps  Take 1,000 Units by mouth daily.     colchicine 0.6 MG tablet  Take 1 tablet (0.6 mg total) by mouth daily.     furosemide 80 MG tablet  Commonly known as:  LASIX  Take 1 tablet (80 mg total) by mouth 2 (two) times daily.     gabapentin 300 MG capsule  Commonly known as:  NEURONTIN  Take 300 mg by mouth 4 (four) times daily. LEG PAIN     glucose blood test strip  Commonly known as:  ACCU-CHEK AVIVA PLUS  Use as instructed to check blood sugar 3 times per day dx code E11.49     Insulin Glargine  300 UNIT/ML Sopn  Commonly known as:  TOUJEO SOLOSTAR  Inject 40 Units into the skin daily.     insulin lispro 100 UNIT/ML KiwkPen  Commonly known as:  HUMALOG KWIKPEN  Inject 0.08-0.12 mLs (8-12 Units total) into the skin 2 (two) times daily.     ipratropium 0.06 % nasal spray  Commonly known as:  ATROVENT  Place 1 spray into both nostrils daily.     losartan 50 MG tablet  Commonly known as:  COZAAR  Take 1 tablet (50 mg total) by mouth daily.     naphazoline-pheniramine 0.025-0.3 % ophthalmic solution  Commonly known as:  NAPHCON-A  Place 1 drop into both eyes daily.     nitroGLYCERIN 0.4 MG SL tablet  Commonly known as:  NITROSTAT  Place 1 tablet (0.4 mg total) under the tongue every 5 (five) minutes as needed for chest pain (MAX 3 TABLETS).     potassium chloride SA 20 MEQ tablet  Commonly known as:  K-DUR,KLOR-CON  Take 30 mEq by mouth 2 (two) times daily. TAKE 1.5 TWICE DAILY TO TOTAL 30 MEQ     warfarin 2.5 MG tablet  Commonly known as:   COUMADIN  Take 1 tablet (2.5 mg total) by mouth as directed.        Allergies:  Allergies  Allergen Reactions  . Avelox [Moxifloxacin Hcl In Nacl] Swelling, Rash and Other (See Comments)    Patient became hypotensive after infusion started Because of a history of documented adverse serious drug reaction;Medi Alert bracelet  is recommended  . Penicillins Anaphylaxis, Other (See Comments) and Swelling    REACTION: anaphylaxis Because of a history of documented adverse serious drug reaction;Medi Alert bracelet  is recommended    Past Medical History, Surgical history, Social history, and Family History were reviewed and updated.  Review of Systems: All other 10 point review of systems is negative.   Physical Exam:  height is 6\' 2"  (1.88 m) and weight is 256 lb (116.121 kg). His oral temperature is 97.4 F (36.3 C). His blood pressure is 88/50 and his pulse is 75. His respiration is 16.   Wt Readings from Last 3 Encounters:  07/16/15 256 lb (116.121 kg)  07/15/15 274 lb (124.286 kg)  07/15/15 254 lb 6.4 oz (115.395 kg)    Ocular: Sclerae unicteric, pupils equal, round and reactive to light Ear-nose-throat: Oropharynx clear, dentition fair Lymphatic: No cervical supraclavicular or axillary adenopathy Lungs no rales or rhonchi, good excursion bilaterally Heart regular rate and rhythm, no murmur appreciated Abd soft, nontender, positive bowel sounds, no liver or spleen tip palpated on exam, no fluid wave MSK no focal spinal tenderness, no joint edema Neuro: non-focal, well-oriented, appropriate affect Breast: Deferred  Lab Results  Component Value Date   WBC 8.5 07/16/2015   HGB 16.7 07/16/2015   HCT 49.1 07/16/2015   MCV 93 07/16/2015   PLT 137* 07/16/2015   Lab Results  Component Value Date   FERRITIN 94 05/14/2015   IRON 135 05/14/2015   TIBC 355 05/14/2015   UIBC 220 05/14/2015   IRONPCTSAT 38 05/14/2015   Lab Results  Component Value Date   RETICCTPCT 1.4  04/02/2014   RBC 5.27 07/16/2015   RETICCTABS 75.2 04/02/2014   No results found for: KPAFRELGTCHN, LAMBDASER, KAPLAMBRATIO No results found for: IGGSERUM, IGA, IGMSERUM No results found for: TOTALPROTELP, ALBUMINELP, A1GS, A2GS, BETS, BETA2SER, GAMS, MSPIKE, SPEI   Chemistry      Component Value Date/Time   NA 140 06/22/2015 1123  NA 141 05/14/2015 1324   NA 144 04/22/2014 1350   K 3.4* 06/22/2015 1123   K 4.3 05/14/2015 1324   K 3.9 04/22/2014 1350   CL 100 06/22/2015 1123   CL 100 04/22/2014 1350   CO2 28 06/22/2015 1123   CO2 31* 05/14/2015 1324   CO2 29 04/22/2014 1350   BUN 43* 06/22/2015 1123   BUN 46.9* 05/14/2015 1324   BUN 30* 04/22/2014 1350   CREATININE 1.71* 06/22/2015 1123   CREATININE 1.88* 06/09/2015 1041   CREATININE 2.2* 05/14/2015 1324      Component Value Date/Time   CALCIUM 9.0 06/22/2015 1123   CALCIUM 9.3 05/14/2015 1324   CALCIUM 9.1 04/22/2014 1350   ALKPHOS 55 06/09/2015 1041   ALKPHOS 65 05/14/2015 1324   ALKPHOS 55 04/22/2014 1350   AST 12 06/09/2015 1041   AST 13 05/14/2015 1324   AST 16 04/22/2014 1350   ALT 11 06/09/2015 1041   ALT 14 05/14/2015 1324   ALT 15 04/22/2014 1350   BILITOT 0.8 06/09/2015 1041   BILITOT 0.95 05/14/2015 1324   BILITOT 1.00 04/22/2014 1350     Impression and Plan: Mr. Bodkins is 75 year old male with polycythemia vera. He is doing well but having a little fatigue. He is otherwise asymptomatic.  His Hct today is 49.1.  Since he was hypotensive earlier we will continue to monitor his BP during phlebotomy. He will get fluids before and after his phlebotomy today.  We will plan to see him back in 2 months for labs and follow-up.  Both he and his family know to call with any questions or concerns. We can certainly see him sooner if need be.   Eliezer Bottom, NP 4/27/201711:52 AM

## 2015-07-16 NOTE — Patient Instructions (Signed)
phlTherapeutic Phlebotomy Therapeutic phlebotomy is the controlled removal of blood from a person's body for the purpose of treating a medical condition. The procedure is similar to donating blood. Usually, about a pint (470 mL, or 0.47L) of blood is removed. The average adult has 9-12 pints (4.3-5.7 L) of blood. Therapeutic phlebotomy may be used to treat the following medical conditions:  Hemochromatosis. This is a condition in which the blood contains too much iron.  Polycythemia vera. This is a condition in which the blood contains too many red blood cells.  Porphyria cutanea tarda. This is a disease in which an important part of hemoglobin is not made properly. It results in the buildup of abnormal amounts of porphyrins in the body.  Sickle cell disease. This is a condition in which the red blood cells form an abnormal crescent shape rather than a round shape. LET [facility] CARE PROVIDER KNOW ABOUT:  Any allergies you have.  All medicines you are taking, including vitamins, herbs, eye drops, creams, and over-the-counter medicines.  Previous problems you or members of your family have had with the use of anesthetics.  Any blood disorders you have.  Previous surgeries you have had.  Any medical conditions you may have. RISKS AND COMPLICATIONS Generally, this is a safe procedure. However, problems may occur, including:  Nausea or light-headedness.  Low blood pressure.  Soreness, bleeding, swelling, or bruising at the needle insertion site.  Infection. BEFORE THE PROCEDURE  Follow instructions from your health care provider about eating or drinking restrictions.  Ask your health care provider about changing or stopping your regular medicines. This is especially important if you are taking diabetes medicines or blood thinners.  Wear clothing with sleeves that can be raised above the elbow.  Plan to have someone take you home after the procedure.  You may have a blood  sample taken. PROCEDURE  A needle will be inserted into one of your veins.  Tubing and a collection bag will be attached to that needle.  Blood will flow through the needle and tubing into the collection bag.  You may be asked to open and close your hand slowly and continually during the entire collection.  After the specified amount of blood has been removed from your body, the collection bag and tubing will be clamped.  The needle will be removed from your vein.  Pressure will be held on the site of the needle insertion to stop the bleeding.  A bandage (dressing) will be placed over the needle insertion site. The procedure may vary among health care providers and hospitals. AFTER THE PROCEDURE  Your recovery will be assessed and monitored.  You can return to your normal activities as directed by your health care provider.   This information is not intended to replace advice given to you by your health care provider. Make sure you discuss any questions you have with your health care provider.   Document Released: 08/09/2010 Document Revised: 07/22/2014 Document Reviewed: 03/03/2014 Elsevier Interactive Patient Education [employer].

## 2015-07-16 NOTE — Progress Notes (Signed)
[name] presents today for phlebotomy per MD orders. Phlebotomy procedure started at 1215 and ended at 1230. 500 grams removed. Patient observed for 30 minutes after procedure without any incident. Patient tolerated procedure well. IV needle removed intact.  Patient feels well BP 115/55  Ok to leave per sara cincinnatii

## 2015-07-16 NOTE — Assessment & Plan Note (Signed)
On atorvastatin 10mg daily 

## 2015-07-17 ENCOUNTER — Telehealth: Payer: Self-pay | Admitting: Interventional Cardiology

## 2015-07-17 ENCOUNTER — Telehealth: Payer: Self-pay | Admitting: Emergency Medicine

## 2015-07-17 DIAGNOSIS — M109 Gout, unspecified: Secondary | ICD-10-CM

## 2015-07-17 NOTE — Telephone Encounter (Signed)
**Note De-Identified Britiny Defrain Obfuscation** The pts wife called to report the pts weight for the past 2 weeks. Noted.

## 2015-07-17 NOTE — Telephone Encounter (Signed)
LMTCB

## 2015-07-17 NOTE — Telephone Encounter (Signed)
PTs wife was in office today and requested that we enter labs for pt to come in before his appt. There are future labs in place by Dr Dwyane Dee. Pt will see [place] again for CPE before seeing Dr Dwyane Dee. Please advise if you would like him to have labs drawn in our lab or wait until appt with Dr Dwyane Dee.

## 2015-07-17 NOTE — Telephone Encounter (Signed)
Uric acid level orderd - other blood work ordered by dr Dwyane Dee....Marland KitchenMarland Kitchen

## 2015-07-17 NOTE — Telephone Encounter (Signed)
New message  Pt wife returned the call from about 2 weeks ago. Req a call back

## 2015-07-20 NOTE — Telephone Encounter (Signed)
LVM informing pt

## 2015-07-31 ENCOUNTER — Telehealth: Payer: Self-pay | Admitting: Pharmacist

## 2015-07-31 NOTE — Telephone Encounter (Signed)
Spoke with pt's wife.  Confirmed Coumadin instructions and made a follow up appointment on 5/23.

## 2015-07-31 NOTE — Telephone Encounter (Signed)
Received fax on 5/11 at 4:25 PM from [place] that patient needs to begin holding Coumadin on 5/12 for injection on 5/17.  Reviewed pt's chart.  -wise he does not need Lovenox bridging (CHADS2 score of 3, remote history of DVT).  Faxed clearance back to [facility].  LMOM for pt and wife to stop Coumadin today and call back for follow up appt week of 5/22.

## 2015-08-04 ENCOUNTER — Other Ambulatory Visit: Payer: Self-pay | Admitting: Cardiology

## 2015-08-05 ENCOUNTER — Ambulatory Visit (INDEPENDENT_AMBULATORY_CARE_PROVIDER_SITE_OTHER): Payer: Medicare Other | Admitting: Pharmacist Clinician (PhC)/ Clinical Pharmacy Specialist

## 2015-08-05 DIAGNOSIS — I4891 Unspecified atrial fibrillation: Secondary | ICD-10-CM

## 2015-08-05 DIAGNOSIS — I482 Chronic atrial fibrillation, unspecified: Secondary | ICD-10-CM

## 2015-08-05 DIAGNOSIS — Z7901 Long term (current) use of anticoagulants: Secondary | ICD-10-CM

## 2015-08-05 LAB — POCT INR: INR: 1.1

## 2015-08-11 ENCOUNTER — Ambulatory Visit (INDEPENDENT_AMBULATORY_CARE_PROVIDER_SITE_OTHER): Payer: Medicare Other | Admitting: *Deleted

## 2015-08-11 DIAGNOSIS — I4891 Unspecified atrial fibrillation: Secondary | ICD-10-CM

## 2015-08-11 DIAGNOSIS — Z7901 Long term (current) use of anticoagulants: Secondary | ICD-10-CM

## 2015-08-11 LAB — POCT INR: INR: 1.6

## 2015-08-25 LAB — CUP PACEART REMOTE DEVICE CHECK
Battery Voltage: 2.97 V
Brady Statistic AS VP Percent: 11.1 %
Brady Statistic RA Percent Paced: 0 %
Brady Statistic RV Percent Paced: 15.13 %
HighPow Impedance: 81 Ohm
Implantable Lead Implant Date: 20090121
Implantable Lead Location: 753858
Implantable Lead Location: 753859
Implantable Lead Model: 4193
Implantable Lead Model: 6935
Lead Channel Impedance Value: 494 Ohm
Lead Channel Pacing Threshold Amplitude: 0.75 V
Lead Channel Sensing Intrinsic Amplitude: 16 mV
Lead Channel Sensing Intrinsic Amplitude: 16 mV
Lead Channel Setting Pacing Amplitude: 2.5 V
Lead Channel Setting Pacing Pulse Width: 0.4 ms
MDC IDC LEAD IMPLANT DT: 20090121
MDC IDC LEAD IMPLANT DT: 20090121
MDC IDC LEAD LOCATION: 753860
MDC IDC MSMT BATTERY REMAINING LONGEVITY: 85 mo
MDC IDC MSMT LEADCHNL LV IMPEDANCE VALUE: 4047 Ohm
MDC IDC MSMT LEADCHNL LV IMPEDANCE VALUE: 4047 Ohm
MDC IDC MSMT LEADCHNL LV IMPEDANCE VALUE: 513 Ohm
MDC IDC MSMT LEADCHNL RA IMPEDANCE VALUE: 494 Ohm
MDC IDC MSMT LEADCHNL RA SENSING INTR AMPL: 0.625 mV
MDC IDC MSMT LEADCHNL RA SENSING INTR AMPL: 0.625 mV
MDC IDC MSMT LEADCHNL RV IMPEDANCE VALUE: 399 Ohm
MDC IDC MSMT LEADCHNL RV PACING THRESHOLD PULSEWIDTH: 0.4 ms
MDC IDC SESS DTM: 20170426083725
MDC IDC SET LEADCHNL RV SENSING SENSITIVITY: 0.3 mV
MDC IDC STAT BRADY AP VP PERCENT: 0 %
MDC IDC STAT BRADY AP VS PERCENT: 0 %
MDC IDC STAT BRADY AS VS PERCENT: 88.9 %

## 2015-08-26 ENCOUNTER — Encounter: Payer: Self-pay | Admitting: Cardiology

## 2015-08-28 ENCOUNTER — Telehealth: Payer: Self-pay | Admitting: Interventional Cardiology

## 2015-08-28 NOTE — Telephone Encounter (Signed)
Follow-up     The wife is returning the nurses phone call. The pt will be soon

## 2015-08-28 NOTE — Telephone Encounter (Signed)
**Note De-Identified Elna Radovich Obfuscation** The pts wife called to report the pts weight. Noted.

## 2015-08-28 NOTE — Telephone Encounter (Signed)
**Note De-identified [name] Obfuscation** LMTCB

## 2015-08-28 NOTE — Telephone Encounter (Signed)
Follow Up ° °Pt returned call//  °

## 2015-08-31 ENCOUNTER — Encounter: Payer: Self-pay | Admitting: Interventional Cardiology

## 2015-08-31 ENCOUNTER — Ambulatory Visit (INDEPENDENT_AMBULATORY_CARE_PROVIDER_SITE_OTHER): Payer: Medicare Other | Admitting: Interventional Cardiology

## 2015-08-31 ENCOUNTER — Ambulatory Visit (INDEPENDENT_AMBULATORY_CARE_PROVIDER_SITE_OTHER): Payer: Medicare Other

## 2015-08-31 VITALS — BP 100/60 | HR 60 | Ht 72.0 in | Wt 258.0 lb

## 2015-08-31 DIAGNOSIS — Z7901 Long term (current) use of anticoagulants: Secondary | ICD-10-CM

## 2015-08-31 DIAGNOSIS — I482 Chronic atrial fibrillation, unspecified: Secondary | ICD-10-CM

## 2015-08-31 DIAGNOSIS — I4891 Unspecified atrial fibrillation: Secondary | ICD-10-CM

## 2015-08-31 DIAGNOSIS — E1159 Type 2 diabetes mellitus with other circulatory complications: Secondary | ICD-10-CM | POA: Diagnosis not present

## 2015-08-31 DIAGNOSIS — I5022 Chronic systolic (congestive) heart failure: Secondary | ICD-10-CM

## 2015-08-31 LAB — POCT INR: INR: 2.7

## 2015-08-31 MED ORDER — CARVEDILOL 6.25 MG PO TABS: 6.2500 mg | ORAL_TABLET | Freq: Two times a day (BID) | ORAL | Status: DC

## 2015-08-31 NOTE — Patient Instructions (Signed)
Medication Instructions:  Same-no changes Weight change-at 250 lbs take a Chlorthalidone dose   Labwork: BMET today  Testing/Procedures: None  Follow-Up: Your physician wants you to follow-up in: March 2018. You will receive a reminder letter in the mail two months in advance. If you don't receive a letter, please call our office to schedule the follow-up appointment.     If you need a refill on your cardiac medications before your next appointment, please call your pharmacy.

## 2015-08-31 NOTE — Progress Notes (Signed)
Cardiology Office Note   Date:  08/31/2015   ID:  [name], DOB 21-Jan-1941, MRN [id number]  PCP:  [name], MD    No chief complaint on file.    Wt Readings from Last 3 Encounters:  08/31/15 258 lb (117.028 kg)  07/16/15 256 lb (116.121 kg)  07/15/15 274 lb (124.286 kg)       History of Present Illness: [name] is a 75 y.o. male  Who prefers to be called "[name]," who has a h/o CAD, MI. He had diaphoresis and diffuse pain with his MI. He ended up having CABG. His EF is in the 35-40% range. He has been managed for chronic left ventricular systolic dysfunction over the last several years by Dr. [name]. He has had issues with back pain and has had several injections. He weighs every day. He is in contact with my nurse Jeani Hawking regarding his weight. When his weight goes above 246 pounds, he takes a chlorthalidone tablet.  He denies any chest discomfort or shortness of breath. He is not having any trouble lying flat. He is not having any lower extremity edema.  He has been using the chlorthalidone more frequently. About every third day, he is using the chlorthalidone he does not feel short of breath or particularly swollen when he has to use the chlorthalidone. He is concerned about his kidney function.  He does not exercise due to chronic back pain. He has had multiple surgeries. He has used a recumbent bike in the past with some success.    Past Medical History  Diagnosis Date  . Diabetes mellitus   . Hyperlipidemia   . Hypertension   . Pilonidal cyst   . Atrial fibrillation ([place])     Previous long-term amiodarone therapy with multiple cardioversions / amiodarone stopped September, 2009  . Atrial flutter [facility])     Started November, 2010, Left-sided and cannot ablate  . Left atrial thrombus [facility])     Remote past... cardioversions done since that time  . Wide-complex tachycardia ([place])   . Left ventricular ejection fraction less than 40%   . Gout   .  AAA (abdominal aortic aneurysm) [facility])     Surgical repair  . Discolored skin   . S/P ICD (internal cardiac defibrillator) procedure     Dr. Lovena Le 2009... by the pacing  . SOB (shortness of breath)     Large left effusion/ thoracentesis/hospitalization/November, 2011... exudated.. cytology negative.. Dr.Wert.. no proof of mesothelioma  . Pericardial effusion   . Pleural effusion     Large loculated effusion on the left side November, 2011. This was tapped. It was exudative. Cytology revealed no cancer no proof of mesothelioma area pulmonary team felt that no further workup was needed  . S/P AAA repair   . Spinal stenosis     Surgery Dr.Elsner  . CAD (coronary artery disease)     Catheterization July, 2008... name and vein grafts patent but low cardiac output  . Warfarin anticoagulation   . Cardiomyopathy     Ischemic... ICD  . Combined systolic and diastolic CHF   . Venous insufficiency     Toe discoloration chronic  . Mitral regurgitation     Mild echo  . Aortic valve sclerosis   . Nasal drainage     Chronic  . Alcohol ingestion of more than four drinks per week     Excess beer  not a dependency problem  . Chronotropic incompetence  IV pacing rate adjusted  . Thrombophlebitis of superficial veins of upper extremities     Possible venous stenosis from defibrillator  . Pericardial effusion     November, 2011 .. decreased during hospitalization  . Eye abnormality     Ophthalmologist questions a clot in one of his eyes, May, 2012  . Overweight(278.02)     November, 2012  . Pleural thickening   . Ejection fraction     Ejection fraction has varied over time from 35-50%.,, Echoes are technically very difficult,,, EF 50%, echo, May 25, 2011, technically very difficult  . Drug therapy     Redness and swelling with Avelox infusion May 24, 2011  . COPD (chronic obstructive pulmonary disease) ([place])   . Myocardial infarction ([place])   . Spinal cord stimulator status     October,  2013  . Carotid artery disease ([place])     Doppler, December, 2013, 0-39% bilateral  . Pneumonia   . Arthritis   . Ventral hernia     April, 2014, result of his abdominal surgery  . Bony abnormality     Patient's manubrium is slightly displaced to the right  . Ascending aortic aneurysm ([place]) 05/10/2014  . Polycythemia vera ([place]) 07/28/2014  . Internal hemorrhoids   . Diverticulosis   . Adenomatous colon polyp     Past Surgical History  Procedure Laterality Date  . Colonoscopy w/ polypectomy    . Lumbar fusion    . Pilonidal cyst removal    . Surgery scrotal / testicular    . Coronary artery bypass graft  2004  . Abdominal aortic aneurysm repair  11/2002  . Bi-ventricular pacemaker insertion (crt-p)  02-11-2013    Pt with previously implanted MDT CRTD downgraded to CRTP by Dr Lovena Le 02-11-13  . Back surgery    . Incision and drainage abscess / hematoma of bursa / knee / thigh    . Implantable cardioverter defibrillator (icd) generator change N/A 02/11/2013    Procedure: ICD GENERATOR CHANGE;  Surgeon: Evans Lance, MD;  Location: [facility] CATH LAB;  Service: Cardiovascular;  Laterality: N/A;  . Leg surgery Right      Current Outpatient Prescriptions  Medication Sig Dispense Refill  . ACCU-CHEK FASTCLIX LANCETS MISC USE ONE  TO CHECK BLOOD SUGAR THREE TIMES DAILY 102 each 5  . acetaminophen (TYLENOL) 325 MG tablet Take 325 mg by mouth every morning.     Marland Kitchen allopurinol (ZYLOPRIM) 100 MG tablet TAKE ONE TABLET BY MOUTH ONCE DAILY 90 tablet 0  . amLODipine (NORVASC) 5 MG tablet Take 2.5 mg by mouth daily.    Marland Kitchen aspirin 81 MG tablet Take 81 mg by mouth once a week.     Marland Kitchen atorvastatin (LIPITOR) 10 MG tablet TAKE ONE TABLET BY MOUTH ONCE DAILY 90 tablet 2  . carboxymethylcellulose (REFRESH PLUS) 0.5 % SOLN Place 2 drops into both eyes daily.     . carvedilol (COREG) 6.25 MG tablet TAKE ONE TABLET BY MOUTH TWICE DAILY 180 tablet 0  . chlorthalidone (HYGROTON) 25 MG tablet Take 25 mg by mouth  daily. when weight reaches 246 lbs or greater ONLY!!    . Cholecalciferol 10000 UNITS CAPS Take 1,000 Units by mouth daily.    . colchicine 0.6 MG tablet Take 1 tablet (0.6 mg total) by mouth daily. 90 tablet 3  . furosemide (LASIX) 80 MG tablet Take 1 tablet (80 mg total) by mouth 2 (two) times daily. 180 tablet 3  . gabapentin (NEURONTIN) 300 MG capsule  Take 300 mg by mouth 4 (four) times daily. LEG PAIN    . glucose blood (ACCU-CHEK AVIVA PLUS) test strip Use as instructed to check blood sugar 3 times per day dx code E11.49 100 each 5  . Insulin Glargine (TOUJEO SOLOSTAR) 300 UNIT/ML SOPN Inject 40 Units into the skin daily. 4.5 mL 3  . insulin lispro (HUMALOG KWIKPEN) 100 UNIT/ML KiwkPen Inject 14 Units into the skin. 12-14 UNITS BEFORE LUNCH  18 UNITS AT SUPPER    . ipratropium (ATROVENT) 0.06 % nasal spray Place 1 spray into both nostrils daily.     Marland Kitchen losartan (COZAAR) 50 MG tablet TAKE ONE TABLET BY MOUTH ONCE DAILY 90 tablet 1  . naphazoline-pheniramine (NAPHCON-A) 0.025-0.3 % ophthalmic solution Place 1 drop into both eyes daily.     . nitroGLYCERIN (NITROSTAT) 0.4 MG SL tablet Place 1 tablet (0.4 mg total) under the tongue every 5 (five) minutes as needed for chest pain (MAX 3 TABLETS). 25 tablet 3  . potassium chloride SA (K-DUR,KLOR-CON) 20 MEQ tablet Take 30 mEq by mouth 2 (two) times daily. TAKE 1.5 TWICE DAILY TO TOTAL 30 MEQ    . warfarin (COUMADIN) 2.5 MG tablet Take 1 tablet (2.5 mg total) by mouth as directed. 130 tablet 1   No current facility-administered medications for this visit.    Allergies:   Avelox and Penicillins    Social History:  The patient  reports that he quit smoking about 20 years ago. His smoking use included Cigarettes. He started smoking about 59 years ago. He has a 117 pack-year smoking history. He has never used smokeless tobacco. He reports that he drinks about 1.2 oz of alcohol per week. He reports that he does not use illicit drugs.   Family  History:  The patient's family history includes Coronary artery disease in his father; Diabetes in his brother and father; Hypertension in his mother; Other in his father; Stroke in his mother. There is no history of Colon cancer or Heart attack.    ROS:  Please see the history of present illness.   Otherwise, review of systems are positive for .   All other systems are reviewed and negative.    PHYSICAL EXAM: VS:  BP 100/60 mmHg  Pulse 60  Ht 6' (1.829 m)  Wt 258 lb (117.028 kg)  BMI 34.98 kg/m2 , BMI Body mass index is 34.98 kg/(m^2). GEN: Well nourished, well developed, in no acute distress HEENT: normal Neck: no JVD, carotid bruits, or masses Cardiac: irregularly irregular; no murmurs, rubs, or gallops,no edema  Respiratory:  clear to auscultation bilaterally, normal work of breathing GI: soft, nontender, nondistended, + BS MS: no deformity or atrophy Skin: warm and dry, no rash Neuro:  Strength and sensation are intact Psych: euthymic mood, full affect   EKG:   The ekg ordered today demonstrates atrial fibrillation, rate controlled   Recent Labs: 10/07/2014: TSH 0.59 06/09/2015: ALT 11 06/22/2015: BUN 43*; Creat 1.71*; Potassium 3.4*; Sodium 140 07/16/2015: HGB 16.7; Platelets 137*   Lipid Panel    Component Value Date/Time   CHOL 152 09/02/2014 1034   TRIG 112.0 09/02/2014 1034   HDL 40.00 09/02/2014 1034   CHOLHDL 4 09/02/2014 1034   VLDL 22.4 09/02/2014 1034   [place] 90 09/02/2014 1034     Other studies Reviewed: Additional studies/ records that were reviewed today with results demonstrating: daily weight; > 24 about 30-40% of the time.   ASSESSMENT AND PLAN:  1. AFib: rate controlled. Pacer for  bradycardia. Coumadin for stroke prevention. 2. CAD: s/p CABG. He had distinct sx with his prior MI.  3. Chronic systolic heart failure: Appears euvolemic. Continue daily weights and management of volume status. He will continue to be in contact with Jeani Hawking.   Will change threshold for taking extra diuretic for weight > 250 lbs. 4. DM: Followed by PMD.  5. Polycythemia vera: Feels much better after his phlebotomy treatments. 6. I encouraged him to try to get access to a recumbent bike to help increase his stamina and ability to exercise.   Current medicines are reviewed at length with the patient today.  The patient concerns regarding his medicines were addressed.  The following changes have been made:  No change  Labs/ tests ordered today include: BMet No orders of the defined types were placed in this encounter.    Recommend 150 minutes/week of aerobic exercise Low fat, low carb, high fiber diet recommended  Disposition:   FU in 9 months   Signed, Larae Grooms, MD  08/31/2015 3:38 PM    [facility] [place], [place], [place]  [address] Phone: [phone]; Fax: [phone]

## 2015-09-01 ENCOUNTER — Other Ambulatory Visit: Payer: Self-pay | Admitting: *Deleted

## 2015-09-01 DIAGNOSIS — M533 Sacrococcygeal disorders, not elsewhere classified: Secondary | ICD-10-CM | POA: Diagnosis not present

## 2015-09-01 DIAGNOSIS — I5022 Chronic systolic (congestive) heart failure: Secondary | ICD-10-CM

## 2015-09-01 DIAGNOSIS — M545 Low back pain: Secondary | ICD-10-CM | POA: Diagnosis not present

## 2015-09-01 LAB — BASIC METABOLIC PANEL
BUN: 59 mg/dL — ABNORMAL HIGH (ref 7–25)
CHLORIDE: 97 mmol/L — AB (ref 98–110)
CO2: 28 mmol/L (ref 20–31)
CREATININE: 2.17 mg/dL — AB (ref 0.70–1.18)
Calcium: 9.2 mg/dL (ref 8.6–10.3)
Glucose, Bld: 115 mg/dL — ABNORMAL HIGH (ref 65–99)
POTASSIUM: 3.4 mmol/L — AB (ref 3.5–5.3)
SODIUM: 142 mmol/L (ref 135–146)

## 2015-09-02 ENCOUNTER — Other Ambulatory Visit: Payer: Self-pay | Admitting: Endocrinology

## 2015-09-02 NOTE — Telephone Encounter (Signed)
Pt needs the Humulog Vial sent to [facility] [address]

## 2015-09-03 DIAGNOSIS — H6123 Impacted cerumen, bilateral: Secondary | ICD-10-CM | POA: Diagnosis not present

## 2015-09-03 MED ORDER — INSULIN LISPRO 100 UNIT/ML ~~LOC~~ SOLN: SUBCUTANEOUS | Status: DC

## 2015-09-03 NOTE — Telephone Encounter (Signed)
I contacted the pt's wife and advised rx has been sent. Pt voiced understanding.

## 2015-09-03 NOTE — Telephone Encounter (Signed)
Patient's wife(Frances Lelon Frohlich) calling to check the status of refill for Humalog vial and not the pen.  She states she called the pharmacy and this has not been called in yet.  Please let patient's wife know when refill has been sent to pharmacy.  Pharmacy:  [id number] - [place] ([address]), [place] - [place]

## 2015-09-07 ENCOUNTER — Other Ambulatory Visit: Payer: Self-pay | Admitting: Internal Medicine

## 2015-09-08 ENCOUNTER — Other Ambulatory Visit (INDEPENDENT_AMBULATORY_CARE_PROVIDER_SITE_OTHER): Payer: Medicare Other

## 2015-09-08 DIAGNOSIS — I5022 Chronic systolic (congestive) heart failure: Secondary | ICD-10-CM | POA: Diagnosis not present

## 2015-09-08 LAB — BASIC METABOLIC PANEL
BUN: 33 mg/dL — ABNORMAL HIGH (ref 7–25)
CALCIUM: 9.1 mg/dL (ref 8.6–10.3)
CO2: 27 mmol/L (ref 20–31)
Chloride: 100 mmol/L (ref 98–110)
Creat: 1.58 mg/dL — ABNORMAL HIGH (ref 0.70–1.18)
Glucose, Bld: 133 mg/dL — ABNORMAL HIGH (ref 65–99)
Potassium: 3.8 mmol/L (ref 3.5–5.3)
SODIUM: 141 mmol/L (ref 135–146)

## 2015-09-17 ENCOUNTER — Other Ambulatory Visit: Payer: Medicare Other

## 2015-09-17 ENCOUNTER — Ambulatory Visit: Payer: Medicare Other | Admitting: Family

## 2015-09-17 ENCOUNTER — Other Ambulatory Visit: Payer: Self-pay

## 2015-09-28 DIAGNOSIS — H903 Sensorineural hearing loss, bilateral: Secondary | ICD-10-CM | POA: Diagnosis not present

## 2015-09-30 ENCOUNTER — Ambulatory Visit (HOSPITAL_BASED_OUTPATIENT_CLINIC_OR_DEPARTMENT_OTHER): Payer: Medicare Other

## 2015-09-30 ENCOUNTER — Other Ambulatory Visit (HOSPITAL_BASED_OUTPATIENT_CLINIC_OR_DEPARTMENT_OTHER): Payer: Medicare Other

## 2015-09-30 ENCOUNTER — Ambulatory Visit (HOSPITAL_BASED_OUTPATIENT_CLINIC_OR_DEPARTMENT_OTHER): Payer: Medicare Other | Admitting: Family

## 2015-09-30 ENCOUNTER — Encounter: Payer: Self-pay | Admitting: Family

## 2015-09-30 VITALS — BP 118/59 | HR 67 | Temp 98.1°F | Resp 16 | Ht 72.0 in | Wt 259.0 lb

## 2015-09-30 VITALS — BP 104/65 | HR 59 | Resp 16

## 2015-09-30 DIAGNOSIS — D45 Polycythemia vera: Secondary | ICD-10-CM

## 2015-09-30 DIAGNOSIS — D582 Other hemoglobinopathies: Secondary | ICD-10-CM

## 2015-09-30 LAB — CBC WITH DIFFERENTIAL (CANCER CENTER ONLY)
BASO#: 0 10*3/uL (ref 0.0–0.2)
BASO%: 0.4 % (ref 0.0–2.0)
EOS%: 1.3 % (ref 0.0–7.0)
Eosinophils Absolute: 0.1 10*3/uL (ref 0.0–0.5)
HEMATOCRIT: 51.2 % — AB (ref 38.7–49.9)
HGB: 17.7 g/dL — ABNORMAL HIGH (ref 13.0–17.1)
LYMPH#: 1 10*3/uL (ref 0.9–3.3)
LYMPH%: 14.5 % (ref 14.0–48.0)
MCH: 32.1 pg (ref 28.0–33.4)
MCHC: 34.6 g/dL (ref 32.0–35.9)
MCV: 93 fL (ref 82–98)
MONO#: 0.7 10*3/uL (ref 0.1–0.9)
MONO%: 9.8 % (ref 0.0–13.0)
NEUT#: 5.1 10*3/uL (ref 1.5–6.5)
NEUT%: 74 % (ref 40.0–80.0)
Platelets: 171 10*3/uL (ref 145–400)
RBC: 5.51 10*6/uL (ref 4.20–5.70)
RDW: 14.9 % (ref 11.1–15.7)
WBC: 7 10*3/uL (ref 4.0–10.0)

## 2015-09-30 MED ORDER — SODIUM CHLORIDE 0.9 % IV SOLN
500.0000 mL | INTRAVENOUS | Status: DC
  Administered 2015-09-30: 12:00:00 via INTRAVENOUS

## 2015-09-30 NOTE — Progress Notes (Signed)
Hematology and Oncology Follow Up Visit  [name]:[id number] 09-17-1940 75 y.o. 09/30/2015   Principle Diagnosis:  Polycythemia vera  Current Therapy:   Phlebotomy to maintain hematocrit below 45%. Aspirin 81 mg by mouth daily    Interim History:  [name] is here today with his wife for a follow-up. He is doing well and has no complaints at this time. He and his wife just got back from the beach and state that they had a great trip and ate too much.  His Hct is 51.2 so he will be phlebotomized today.  No fever, rash, dizziness, headaches, blurred vision, chest pain, palpitations, abdominal pain or changes in bowel or bladder habits.  No lymphadenopathy found on exam.  No episodes of bleeding. He is on coumadin daily for atrial fib and also takes 1 baby aspirin a week. He still bruises easily.  He has some swelling in his right ankle due to an old injury. This comes and goes. He has no other swelling and no tenderness in his extremities. The numbness and tingling in his feet is unchanged. No new aches or pains. He uses his cane to ambulate. No falls or syncopal episodes.  He has maintained a good appetite and is staying well hydrated. His weight is stable.   Medications:    Medication List       This list is accurate as of: 09/30/15 11:20 AM.  Always use your most recent med list.               ACCU-CHEK FASTCLIX LANCETS Misc  USE ONE  TO CHECK BLOOD SUGAR THREE TIMES DAILY     acetaminophen 325 MG tablet  Commonly known as:  TYLENOL  Take 325 mg by mouth every morning.     allopurinol 100 MG tablet  Commonly known as:  ZYLOPRIM  TAKE ONE TABLET BY MOUTH ONCE DAILY     amLODipine 5 MG tablet  Commonly known as:  NORVASC  Take 2.5 mg by mouth daily.     aspirin 81 MG tablet  Take 81 mg by mouth once a week.     atorvastatin 10 MG tablet  Commonly known as:  LIPITOR  TAKE ONE TABLET BY MOUTH ONCE DAILY     carboxymethylcellulose 0.5 % Soln  Commonly  known as:  REFRESH PLUS  Place 2 drops into both eyes daily.     carvedilol 6.25 MG tablet  Commonly known as:  COREG  Take 1 tablet (6.25 mg total) by mouth 2 (two) times daily.     chlorthalidone 25 MG tablet  Commonly known as:  HYGROTON  Take 1 tablet daily when weight reaches 255 lbs or greater ONLY!!     Cholecalciferol 10000 units Caps  Take 1,000 Units by mouth daily.     colchicine 0.6 MG tablet  Take 1 tablet (0.6 mg total) by mouth daily.     furosemide 80 MG tablet  Commonly known as:  LASIX  Take 1 tablet (80 mg total) by mouth 2 (two) times daily.     gabapentin 300 MG capsule  Commonly known as:  NEURONTIN  Take 300 mg by mouth 4 (four) times daily. LEG PAIN     glucose blood test strip  Commonly known as:  ACCU-CHEK AVIVA PLUS  Use as instructed to check blood sugar 3 times per day dx code E11.49     Insulin Glargine 300 UNIT/ML Sopn  Commonly known as:  TOUJEO SOLOSTAR  Inject 40 Units  into the skin daily.     insulin lispro 100 UNIT/ML injection  Commonly known as:  HUMALOG  12-14 units into the skin before lunch and 18 units before supper/     ipratropium 0.06 % nasal spray  Commonly known as:  ATROVENT  Place 1 spray into both nostrils daily.     losartan 50 MG tablet  Commonly known as:  COZAAR  TAKE ONE TABLET BY MOUTH ONCE DAILY     naphazoline-pheniramine 0.025-0.3 % ophthalmic solution  Commonly known as:  NAPHCON-A  Place 1 drop into both eyes daily.     nitroGLYCERIN 0.4 MG SL tablet  Commonly known as:  NITROSTAT  Place 1 tablet (0.4 mg total) under the tongue every 5 (five) minutes as needed for chest pain (MAX 3 TABLETS).     potassium chloride SA 20 MEQ tablet  Commonly known as:  K-DUR,KLOR-CON  Take 30 mEq by mouth 2 (two) times daily. TAKE 1.5 TWICE DAILY TO TOTAL 30 MEQ     warfarin 2.5 MG tablet  Commonly known as:  COUMADIN  Take 1 tablet (2.5 mg total) by mouth as directed.        Allergies:  Allergies  Allergen  Reactions  . Avelox [Moxifloxacin Hcl In Nacl] Swelling, Rash and Other (See Comments)    Patient became hypotensive after infusion started Because of a history of documented adverse serious drug reaction;Medi Alert bracelet  is recommended  . Penicillins Anaphylaxis, Other (See Comments) and Swelling    REACTION: anaphylaxis Because of a history of documented adverse serious drug reaction;Medi Alert bracelet  is recommended    Past Medical History, Surgical history, Social history, and Family History were reviewed and updated.  Review of Systems: All other 10 point review of systems is negative.   Physical Exam:  height is 6' (1.829 m) and weight is 259 lb (117.482 kg). His oral temperature is 98.1 F (36.7 C). His blood pressure is 118/59 and his pulse is 67. His respiration is 16.   Wt Readings from Last 3 Encounters:  09/30/15 259 lb (117.482 kg)  08/31/15 258 lb (117.028 kg)  07/16/15 256 lb (116.121 kg)    Ocular: Sclerae unicteric, pupils equal, round and reactive to light Ear-nose-throat: Oropharynx clear, dentition fair Lymphatic: No cervical supraclavicular or axillary adenopathy Lungs no rales or rhonchi, good excursion bilaterally Heart regular rate and rhythm, no murmur appreciated Abd soft, nontender, positive bowel sounds, no liver or spleen tip palpated on exam, no fluid wave MSK no focal spinal tenderness, no joint edema Neuro: non-focal, well-oriented, appropriate affect Breast: Deferred  Lab Results  Component Value Date   WBC 7.0 09/30/2015   HGB 17.7* 09/30/2015   HCT 51.2* 09/30/2015   MCV 93 09/30/2015   PLT 171 09/30/2015   Lab Results  Component Value Date   FERRITIN 94 05/14/2015   IRON 135 05/14/2015   TIBC 355 05/14/2015   UIBC 220 05/14/2015   IRONPCTSAT 38 05/14/2015   Lab Results  Component Value Date   RETICCTPCT 1.4 04/02/2014   RBC 5.51 09/30/2015   RETICCTABS 75.2 04/02/2014   No results found for: KPAFRELGTCHN, LAMBDASER,  KAPLAMBRATIO No results found for: IGGSERUM, IGA, IGMSERUM No results found for: Odetta Pink, SPEI   Chemistry      Component Value Date/Time   NA 141 09/08/2015 0956   NA 141 05/14/2015 1324   NA 144 04/22/2014 1350   K 3.8 09/08/2015 0956   K  4.3 05/14/2015 1324   K 3.9 04/22/2014 1350   CL 100 09/08/2015 0956   CL 100 04/22/2014 1350   CO2 27 09/08/2015 0956   CO2 31* 05/14/2015 1324   CO2 29 04/22/2014 1350   BUN 33* 09/08/2015 0956   BUN 46.9* 05/14/2015 1324   BUN 30* 04/22/2014 1350   CREATININE 1.58* 09/08/2015 0956   CREATININE 1.88* 06/09/2015 1041   CREATININE 2.2* 05/14/2015 1324      Component Value Date/Time   CALCIUM 9.1 09/08/2015 0956   CALCIUM 9.3 05/14/2015 1324   CALCIUM 9.1 04/22/2014 1350   ALKPHOS 55 06/09/2015 1041   ALKPHOS 65 05/14/2015 1324   ALKPHOS 55 04/22/2014 1350   AST 12 06/09/2015 1041   AST 13 05/14/2015 1324   AST 16 04/22/2014 1350   ALT 11 06/09/2015 1041   ALT 14 05/14/2015 1324   ALT 15 04/22/2014 1350   BILITOT 0.8 06/09/2015 1041   BILITOT 0.95 05/14/2015 1324   BILITOT 1.00 04/22/2014 1350     Impression and Plan: Mr. Point is 75 year old male with polycythemia vera. He continues to do well and is asymptomatic at this time. BP 118/59 and HR 67.  We will phlebotomize him today and give him replacement fluids afterwards.  We will then plan to see him back in 1 month for labs and follow-up.  Both he and his family know to call with any questions or concerns. We can certainly see him sooner if need be.   Eliezer Bottom, NP 7/12/201711:20 AM

## 2015-09-30 NOTE — Patient Instructions (Signed)

## 2015-10-01 ENCOUNTER — Ambulatory Visit (INDEPENDENT_AMBULATORY_CARE_PROVIDER_SITE_OTHER): Payer: Medicare Other | Admitting: *Deleted

## 2015-10-01 ENCOUNTER — Telehealth: Payer: Self-pay | Admitting: Interventional Cardiology

## 2015-10-01 DIAGNOSIS — Z7901 Long term (current) use of anticoagulants: Secondary | ICD-10-CM | POA: Diagnosis not present

## 2015-10-01 DIAGNOSIS — I4891 Unspecified atrial fibrillation: Secondary | ICD-10-CM

## 2015-10-01 LAB — POCT INR: INR: 2.6

## 2015-10-01 NOTE — Telephone Encounter (Signed)
New message   Pt called for nurse. Would not tell me what is was concerning. Please call.

## 2015-10-02 NOTE — Telephone Encounter (Signed)
**Note De-Identified Alante Weimann Obfuscation** The pts wife, Daimian, called to report the pts weights. Noted.

## 2015-10-05 ENCOUNTER — Telehealth: Payer: Self-pay | Admitting: Internal Medicine

## 2015-10-05 NOTE — Telephone Encounter (Signed)
Mrs. Blankley is calling about Mr. Gean Defb/ Pacemaker . She states she is needing someone to call her Today. Please Call   Thanks

## 2015-10-05 NOTE — Telephone Encounter (Signed)
[name] is having work done on the house this week. She wants to know if it is ok to unplug [employer] monitor. I advised her that it is ok to unplug and that when she's ready to reconnect it, she just needs to plug it back in. Next transmission is due 10/14/15.

## 2015-10-07 ENCOUNTER — Other Ambulatory Visit (INDEPENDENT_AMBULATORY_CARE_PROVIDER_SITE_OTHER): Payer: Medicare Other

## 2015-10-07 DIAGNOSIS — M109 Gout, unspecified: Secondary | ICD-10-CM | POA: Diagnosis not present

## 2015-10-07 LAB — URIC ACID: Uric Acid, Serum: 12 mg/dL — ABNORMAL HIGH (ref 4.0–7.8)

## 2015-10-08 ENCOUNTER — Other Ambulatory Visit: Payer: Self-pay | Admitting: Endocrinology

## 2015-10-09 ENCOUNTER — Ambulatory Visit (INDEPENDENT_AMBULATORY_CARE_PROVIDER_SITE_OTHER): Payer: Medicare Other | Admitting: Internal Medicine

## 2015-10-09 ENCOUNTER — Encounter: Payer: Self-pay | Admitting: Internal Medicine

## 2015-10-09 VITALS — BP 138/74 | HR 72 | Temp 97.7°F | Resp 16 | Wt 256.0 lb

## 2015-10-09 DIAGNOSIS — Z125 Encounter for screening for malignant neoplasm of prostate: Secondary | ICD-10-CM

## 2015-10-09 DIAGNOSIS — Z8739 Personal history of other diseases of the musculoskeletal system and connective tissue: Secondary | ICD-10-CM | POA: Insufficient documentation

## 2015-10-09 DIAGNOSIS — E785 Hyperlipidemia, unspecified: Secondary | ICD-10-CM | POA: Diagnosis not present

## 2015-10-09 DIAGNOSIS — I10 Essential (primary) hypertension: Secondary | ICD-10-CM | POA: Diagnosis not present

## 2015-10-09 DIAGNOSIS — E1159 Type 2 diabetes mellitus with other circulatory complications: Secondary | ICD-10-CM | POA: Diagnosis not present

## 2015-10-09 DIAGNOSIS — M4800 Spinal stenosis, site unspecified: Secondary | ICD-10-CM

## 2015-10-09 DIAGNOSIS — N183 Chronic kidney disease, stage 3 unspecified: Secondary | ICD-10-CM

## 2015-10-09 DIAGNOSIS — Z0000 Encounter for general adult medical examination without abnormal findings: Secondary | ICD-10-CM

## 2015-10-09 MED ORDER — ATORVASTATIN CALCIUM 10 MG PO TABS: 10.0000 mg | ORAL_TABLET | Freq: Every day | ORAL | Status: DC

## 2015-10-09 MED ORDER — METHOCARBAMOL 750 MG PO TABS: 750.0000 mg | ORAL_TABLET | Freq: Two times a day (BID) | ORAL | Status: DC

## 2015-10-09 MED ORDER — ALLOPURINOL 100 MG PO TABS: 200.0000 mg | ORAL_TABLET | Freq: Every day | ORAL | Status: DC

## 2015-10-09 NOTE — Assessment & Plan Note (Signed)
Stable.       - Continue to monitor

## 2015-10-09 NOTE — Assessment & Plan Note (Signed)
On allopurinol 100 mg daily and uric acid is 12 Increase allopurinol to 200 mg daily Recheck uric acid in about 3 months

## 2015-10-09 NOTE — Patient Instructions (Addendum)
  [name] , Thank you for taking time to come for your Medicare Wellness Visit. I appreciate your ongoing commitment to your health goals. Please review the following plan we discussed and let me know if I can assist you in the future.   These are the goals we discussed: Goals    None      This is a list of the screening recommended for you and due dates:  Health Maintenance  Topic Date Due  . Flu Shot  10/20/2015  . Hemoglobin A1C  12/10/2015  . Eye exam for diabetics  01/06/2016  . Colon Cancer Screening  05/18/2016  . Complete foot exam   06/16/2016  . Tetanus Vaccine  10/06/2024  . Shingles Vaccine  Completed  . Pneumonia vaccines  Completed   Have blood work in 3 months  Test(s) ordered today. Your results will be released to [place] (or called to you) after review, usually within 72hours after test completion. If any changes need to be made, you will be notified at that same time.  All other Health Maintenance issues reviewed.   All recommended immunizations and age-appropriate screenings are up-to-date or discussed.  No immunizations administered today.   Medications reviewed and updated.  Changes include increasing the allopurinol and trying the muscle relaxer - methocarbamol.   Your prescription(s) have been submitted to your pharmacy. Please take as directed and contact our office if you believe you are having problem(s) with the medication(s).    Please followup in 6 months

## 2015-10-09 NOTE — Assessment & Plan Note (Signed)
BP well controlled Current regimen effective and well tolerated Continue current medications at current doses cmp  

## 2015-10-09 NOTE — Progress Notes (Signed)
Pre visit review using our clinic review tool, if applicable. No additional management support is needed unless otherwise documented below in the visit note. 

## 2015-10-09 NOTE — Assessment & Plan Note (Signed)
Following with Dr Dwyane Dee - will see him next week Management per him Will have blood work done with him

## 2015-10-09 NOTE — Progress Notes (Signed)
Subjective:    Patient ID: [name], male    DOB: 30-Nov-1940, 75 y.o.   MRN: [id number]  HPI Here for medicare wellness exam/physical exam.   The muscles in his back tense up a lot.  He has used a muscle relaxer several years ago - robaxin and it helped, but he wonders if there is a stronger one.    I have personally reviewed and have noted 1.The patient's medical and social history 2.Their use of alcohol, tobacco or illicit drugs 3.Their current medications and supplements 4.The patient's functional ability including ADL's, fall risks, home safety risks and                 hearing or visual impairment. 5.Diet and physical activities 6.Evidence for depression or mood disorders 7.Care team reviewed and updated - Endo - Dr Dwyane Dee, Cardio - Dr Lovena Le and Dr Irish Lack, Oncology - Dr Marin Olp, Podiatry - Dr Barkley Bruns, ENT - Dr Constance Holster, Eye - Dr Ellie Lunch, surgery - dr elsner   Are there smokers in your home (other than you)? No  Risk Factors Exercise: none- unable to walk or stand long Dietary issues discussed: low fat, lots of veges/ salads, should decrease his portions  Cardiac risk factors: advanced age, hypertension, hyperlipidemia, and obesity (BMI >= 30 kg/m2).  Depression Screen  Have you felt down, depressed or hopeless? No  Have you felt little interest or pleasure in doing things?  No  Activities of Daily Living In your present state of health, do you have any difficulty performing the following activities?:  Driving? No Managing money?  No Feeding yourself? No Getting from bed to chair? No Climbing a flight of stairs? No Preparing food and eating?: No Bathing or showering? No Getting dressed: No Getting to/using the toilet? No Moving around from place to place: No -- uses cane In the past year have you fallen or had a near fall?: No   Are you sexually active?  No  Do you have more than one partner?   N/A  Hearing Difficulties: yes - wears hearing aids Do you often ask people to speak up or repeat themselves? yes Do you experience ringing or noises in your ears? No Do you have difficulty understanding soft or whispered voices? yes Vision:              Any change in vision:  no             Up to date with eye exam: Up to date  Memory:  Do you feel that you have a problem with memory? No  Do you often misplace items? No  Do you feel safe at home?  Yes  Cognitive Testing  Alert, Orientated? Yes  Normal Appearance? Yes  Recall of three objects?  Yes  Can perform simple calculations? Yes  Displays appropriate judgment? Yes  Can read the correct time from a watch face? Yes   Advanced Directives have been discussed with the patient? Yes  Medications and allergies reviewed with patient and updated if appropriate.  Patient Active Problem List   Diagnosis Date Noted  . Chronic systolic heart failure ([place]) 02/24/2015  . Polycythemia vera ([place]) 07/28/2014  . Ascending aortic aneurysm ([place]) 05/10/2014  . Hypoxia 05/05/2014  . COPD exacerbation ([place]) 05/05/2014  . Diabetes ([place]) 05/05/2014  . Chronic systolic CHF (congestive heart failure) ([place]) 03/07/2014  . History of DVT (deep vein thrombosis) 03/07/2014  . Cardiomyopathy, ischemic 02/05/2014  . Pleural plaque without asbestosis 01/23/2014  .  Elevated hemoglobin ([place]) 01/14/2014  . CKD (chronic kidney disease) stage 3, GFR 30-59 ml/min 01/14/2014  . Asbestos exposure 01/14/2014  . Shortness of breath 01/01/2014  . Abscess, scrotum 03/05/2013  . Ejection fraction   . Hypopotassemia 12/20/2012  . ACE-inhibitor cough 10/04/2012  . Ventral hernia   . Bony abnormality   . Carotid artery disease ([place])   . Spinal cord stimulator status   . Peripheral vascular disease with claudication ([place]) 08/31/2011  . Atrial fibrillation ([place])   . Spinal stenosis   . CAD (coronary artery disease)   . Venous insufficiency   . Mitral  regurgitation   . Aortic valve sclerosis   . Alcohol ingestion of more than four drinks per week   . Chronotropic incompetence   . Thrombophlebitis of superficial veins of upper extremities   . Pericardial effusion   . Overweight(278.02)   . Pleural effusion   . Hx of allergic drug reaction   . Hyperlipidemia   . Hypertension   . Atrial flutter ([place])   . Left atrial thrombus ([place])   . AAA (abdominal aortic aneurysm) ([place])   . S/P ICD (internal cardiac defibrillator) procedure   . Warfarin anticoagulation   . Diabetic polyneuropathy ([place]) 01/07/2010  . DIVERTICULOSIS, COLON 06/10/2009  . Uncontrolled diabetes mellitus with peripheral circulatory disorder ([place]) 03/11/2009  . BACK PAIN, LUMBAR 06/12/2008  . 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 Acute gout 07/27/2006  . CORONARY ARTERY BYPASS GRAFT, HX OF 07/27/2006    Current Outpatient Prescriptions on File Prior to Visit  Medication Sig Dispense Refill  . ACCU-CHEK AVIVA PLUS test strip USE ONE STRIP TO CHECK GLUCOSE THREE TIMES DAILY AS DIRECTED 100 each 3  . ACCU-CHEK FASTCLIX LANCETS MISC USE ONE  TO CHECK BLOOD SUGAR THREE TIMES DAILY 102 each 5  . acetaminophen (TYLENOL) 325 MG tablet Take 325 mg by mouth every morning.     Marland Kitchen allopurinol (ZYLOPRIM) 100 MG tablet TAKE ONE TABLET BY MOUTH ONCE DAILY 90 tablet 1  . amLODipine (NORVASC) 5 MG tablet Take 2.5 mg by mouth daily.    Marland Kitchen aspirin 81 MG tablet Take 81 mg by mouth once a week.     Marland Kitchen atorvastatin (LIPITOR) 10 MG tablet TAKE ONE TABLET BY MOUTH ONCE DAILY 90 tablet 2  . carboxymethylcellulose (REFRESH PLUS) 0.5 % SOLN Place 2 drops into both eyes daily.     . carvedilol (COREG) 6.25 MG tablet Take 1 tablet (6.25 mg total) by mouth 2 (two) times daily. 180 tablet 3  . chlorthalidone (HYGROTON) 25 MG tablet Take 1 tablet daily when weight reaches 255 lbs or greater ONLY!!    . Cholecalciferol 10000 UNITS CAPS Take 1,000 Units by mouth daily.    .  colchicine 0.6 MG tablet Take 1 tablet (0.6 mg total) by mouth daily. (Patient taking differently: Take 0.6 mg by mouth daily as needed. ) 90 tablet 3  . furosemide (LASIX) 80 MG tablet Take 1 tablet (80 mg total) by mouth 2 (two) times daily. 180 tablet 3  . gabapentin (NEURONTIN) 300 MG capsule Take 300 mg by mouth 4 (four) times daily. LEG PAIN    . Insulin Glargine (TOUJEO SOLOSTAR) 300 UNIT/ML SOPN Inject 40 Units into the skin daily. 4.5 mL 3  . insulin lispro (HUMALOG) 100 UNIT/ML injection 12-14 units into the skin before lunch and 18 units before supper/ 10 mL 2  . ipratropium (ATROVENT) 0.06 % nasal spray Place 1 spray into both nostrils  daily.     . losartan (COZAAR) 50 MG tablet TAKE ONE TABLET BY MOUTH ONCE DAILY 90 tablet 1  . naphazoline-pheniramine (NAPHCON-A) 0.025-0.3 % ophthalmic solution Place 1 drop into both eyes daily.     . nitroGLYCERIN (NITROSTAT) 0.4 MG SL tablet Place 1 tablet (0.4 mg total) under the tongue every 5 (five) minutes as needed for chest pain (MAX 3 TABLETS). 25 tablet 3  . potassium chloride SA (K-DUR,KLOR-CON) 20 MEQ tablet Take 30 mEq by mouth 2 (two) times daily. TAKE 1.5 TWICE DAILY TO TOTAL 30 MEQ    . warfarin (COUMADIN) 2.5 MG tablet Take 1 tablet (2.5 mg total) by mouth as directed. 130 tablet 1   No current facility-administered medications on file prior to visit.    Past Medical History  Diagnosis Date  . Diabetes mellitus   . Hyperlipidemia   . Hypertension   . Pilonidal cyst   . Atrial fibrillation ([place])     Previous long-term amiodarone therapy with multiple cardioversions / amiodarone stopped September, 2009  . Atrial flutter [facility])     Started November, 2010, Left-sided and cannot ablate  . Left atrial thrombus [facility])     Remote past... cardioversions done since that time  . Wide-complex tachycardia ([place])   . Left ventricular ejection fraction less than 40%   . Gout   . AAA (abdominal aortic aneurysm) [facility])     Surgical repair  .  Discolored skin   . S/P ICD (internal cardiac defibrillator) procedure     Dr. Lovena Le 2009... by the pacing  . SOB (shortness of breath)     Large left effusion/ thoracentesis/hospitalization/November, 2011... exudated.. cytology negative.. Dr.Wert.. no proof of mesothelioma  . Pericardial effusion   . Pleural effusion     Large loculated effusion on the left side November, 2011. This was tapped. It was exudative. Cytology revealed no cancer no proof of mesothelioma area pulmonary team felt that no further workup was needed  . S/P AAA repair   . Spinal stenosis     Surgery Dr.Elsner  . CAD (coronary artery disease)     Catheterization July, 2008... name and vein grafts patent but low cardiac output  . Warfarin anticoagulation   . Cardiomyopathy     Ischemic... ICD  . Combined systolic and diastolic CHF   . Venous insufficiency     Toe discoloration chronic  . Mitral regurgitation     Mild echo  . Aortic valve sclerosis   . Nasal drainage     Chronic  . Alcohol ingestion of more than four drinks per week     Excess beer  not a dependency problem  . Chronotropic incompetence     IV pacing rate adjusted  . Thrombophlebitis of superficial veins of upper extremities     Possible venous stenosis from defibrillator  . Pericardial effusion     November, 2011 .. decreased during hospitalization  . Eye abnormality     Ophthalmologist questions a clot in one of his eyes, May, 2012  . Overweight(278.02)     November, 2012  . Pleural thickening   . Ejection fraction     Ejection fraction has varied over time from 35-50%.,, Echoes are technically very difficult,,, EF 50%, echo, May 25, 2011, technically very difficult  . Drug therapy     Redness and swelling with Avelox infusion May 24, 2011  . COPD (chronic obstructive pulmonary disease) ([place])   . Myocardial infarction ([place])   . Spinal cord stimulator status  October, 2013  . Carotid artery disease ([place])     Doppler, December,  2013, 0-39% bilateral  . Pneumonia   . Arthritis   . Ventral hernia     April, 2014, result of his abdominal surgery  . Bony abnormality     Patient's manubrium is slightly displaced to the right  . Ascending aortic aneurysm ([place]) 05/10/2014  . Polycythemia vera ([place]) 07/28/2014  . Internal hemorrhoids   . Diverticulosis   . Adenomatous colon polyp     Past Surgical History  Procedure Laterality Date  . Colonoscopy w/ polypectomy    . Lumbar fusion    . Pilonidal cyst removal    . Surgery scrotal / testicular    . Coronary artery bypass graft  2004  . Abdominal aortic aneurysm repair  11/2002  . Bi-ventricular pacemaker insertion (crt-p)  02-11-2013    Pt with previously implanted MDT CRTD downgraded to CRTP by Dr Lovena Le 02-11-13  . Back surgery    . Incision and drainage abscess / hematoma of bursa / knee / thigh    . Implantable cardioverter defibrillator (icd) generator change N/A 02/11/2013    Procedure: ICD GENERATOR CHANGE;  Surgeon: Evans Lance, MD;  Location: [facility] CATH LAB;  Service: Cardiovascular;  Laterality: N/A;  . Leg surgery Right     Social History   Social History  . Marital Status: Married    Spouse Name: N/A  . Number of Children: N/A  . Years of Education: N/A   Occupational History  . Retired- Nurse, mental health    Social History Main Topics  . Smoking status: Former Smoker -- 3.00 packs/day for 39 years    Types: Cigarettes    Start date: 05/24/1956    Quit date: 03/22/1995  . Smokeless tobacco: Never Used     Comment: quit smoking 18 years ago  . Alcohol Use: 1.2 oz/week    2 Cans of beer per week     Comment: beer  . Drug Use: No  . Sexual Activity: No   Other Topics Concern  . Not on file   Social History Narrative    Family History  Problem Relation Age of Onset  . Hypertension Mother   . Stroke Mother   . Diabetes Father   . Coronary artery disease Father   . Other Father     DVT  . Colon cancer Neg Hx   . Diabetes  Brother   . Heart attack Neg Hx     Review of Systems  Constitutional: Negative for fever, chills, appetite change, fatigue and unexpected weight change.  HENT: Positive for hearing loss. Negative for tinnitus.   Eyes: Negative for visual disturbance.  Respiratory: Negative for cough, shortness of breath and wheezing.   Cardiovascular: Positive for leg swelling (chronic). Negative for chest pain and palpitations.  Gastrointestinal: Negative for nausea, abdominal pain, diarrhea, constipation and blood in stool.       No gerd  Genitourinary: Negative for dysuria, hematuria and difficulty urinating.  Musculoskeletal: Positive for back pain and arthralgias.  Skin: Negative for color change and rash.  Neurological: Negative for dizziness, light-headedness and headaches.  Psychiatric/Behavioral: Negative for dysphoric mood. The patient is not nervous/anxious.        Objective:   Filed Vitals:   10/09/15 1037  BP: 138/74  Pulse: 72  Temp: 97.7 F (36.5 C)  Resp: 16   Filed Weights   10/09/15 1037  Weight: 256 lb (116.121 kg)   Body mass index  is 34.71 kg/(m^2).   Physical Exam Constitutional: He appears well-developed and well-nourished. No distress.  HENT:  Head: Normocephalic and atraumatic.  Right Ear: External ear normal.  Left Ear: External ear normal.  Mouth/Throat: Oropharynx is clear and moist.  Normal ear canals and TM b/l  Eyes: Conjunctivae and EOM are normal.  Neck: Neck supple. No tracheal deviation present. No thyromegaly present.  No carotid bruit  Cardiovascular: Normal rate, regular rhythm, normal heart sounds and intact distal pulses.   No murmur heard. Pulmonary/Chest: Effort normal and breath sounds normal. No respiratory distress. He has no wheezes. He has no rales.  Abdominal: Soft. Bowel sounds are normal. He exhibits no distension. There is no tenderness.  Genitourinary: deferred  Musculoskeletal: He exhibits no edema.  muscle tenderness with  palpation throughout back Lymphadenopathy:    He has no cervical adenopathy.  Skin: Skin is warm and dry. He is not diaphoretic.  Psychiatric: He has a normal mood and affect. His behavior is normal.         Assessment & Plan:   Wellness Exam: Immunizations  Up to date Colonoscopy   Up to date  Eye exam  Up to date  Hearing loss, yes, wears hearing aids Memory concerns/difficulties - none Independent of ADLs - difficulty ambulating long - uses a cane Stressed the importance of regular exercise - he is very limited in what he is able to do   Patient received copy of preventative screening tests/immunizations recommended for the next 5-10 years.   Physical exam: Screening blood work - will have some done next week with endo and will order some for three months - would like psa checked Immunizations  Up to date  Colonoscopy  Up to date  Eye exams   Up to date  EKG - done by cardiology, done last month Exercise - unable to exericse Weight  Overweight - limited in exercise - stressed decreased portions to help weight loss Skin    no concerns Substance abuse none   See Problem List for Assessment and Plan of chronic medical problems.

## 2015-10-09 NOTE — Assessment & Plan Note (Signed)
Check lipid panel in three months Continue lipitor

## 2015-10-09 NOTE — Assessment & Plan Note (Signed)
Severe pain that limits him significantly  Has simulator which helps with pain, but has chronic muscle tightness Will try robaxin 750 mg 1-2 times daily to see if that helps

## 2015-10-12 ENCOUNTER — Other Ambulatory Visit (INDEPENDENT_AMBULATORY_CARE_PROVIDER_SITE_OTHER): Payer: Medicare Other

## 2015-10-12 DIAGNOSIS — E1165 Type 2 diabetes mellitus with hyperglycemia: Secondary | ICD-10-CM | POA: Diagnosis not present

## 2015-10-12 DIAGNOSIS — Z794 Long term (current) use of insulin: Secondary | ICD-10-CM

## 2015-10-12 LAB — LIPID PANEL
CHOL/HDL RATIO: 4
Cholesterol: 143 mg/dL (ref 0–200)
HDL: 38.7 mg/dL — AB (ref >39.00)
LDL CALC: 73 mg/dL (ref 0–99)
NonHDL: 104.07
TRIGLYCERIDES: 157 mg/dL — AB (ref 0.0–149.0)
VLDL: 31.4 mg/dL (ref 0.0–40.0)

## 2015-10-12 LAB — COMPREHENSIVE METABOLIC PANEL
ALBUMIN: 3.6 g/dL (ref 3.5–5.2)
ALK PHOS: 52 U/L (ref 39–117)
ALT: 12 U/L (ref 0–53)
AST: 12 U/L (ref 0–37)
BUN: 42 mg/dL — ABNORMAL HIGH (ref 6–23)
CO2: 32 mEq/L (ref 19–32)
Calcium: 9.3 mg/dL (ref 8.4–10.5)
Chloride: 101 mEq/L (ref 96–112)
Creatinine, Ser: 1.97 mg/dL — ABNORMAL HIGH (ref 0.40–1.50)
GFR: 35.45 mL/min — AB (ref >60.00)
Glucose, Bld: 138 mg/dL — ABNORMAL HIGH (ref 70–99)
POTASSIUM: 3.8 meq/L (ref 3.5–5.1)
Sodium: 142 mEq/L (ref 135–145)
TOTAL PROTEIN: 6 g/dL (ref 6.0–8.3)
Total Bilirubin: 0.7 mg/dL (ref 0.2–1.2)

## 2015-10-12 LAB — MICROALBUMIN / CREATININE URINE RATIO
Creatinine,U: 142.9 mg/dL
Microalb Creat Ratio: 0.8 mg/g (ref 0.0–30.0)
Microalb, Ur: 1.2 mg/dL (ref 0.0–1.9)

## 2015-10-12 LAB — HEMOGLOBIN A1C: Hgb A1c MFr Bld: 6.9 % — ABNORMAL HIGH (ref 4.6–6.5)

## 2015-10-14 ENCOUNTER — Other Ambulatory Visit: Payer: Self-pay | Admitting: Cardiology

## 2015-10-14 ENCOUNTER — Ambulatory Visit (INDEPENDENT_AMBULATORY_CARE_PROVIDER_SITE_OTHER): Payer: Medicare Other | Admitting: *Deleted

## 2015-10-14 DIAGNOSIS — L570 Actinic keratosis: Secondary | ICD-10-CM | POA: Diagnosis not present

## 2015-10-14 DIAGNOSIS — L821 Other seborrheic keratosis: Secondary | ICD-10-CM | POA: Diagnosis not present

## 2015-10-14 DIAGNOSIS — D1801 Hemangioma of skin and subcutaneous tissue: Secondary | ICD-10-CM | POA: Diagnosis not present

## 2015-10-14 DIAGNOSIS — I255 Ischemic cardiomyopathy: Secondary | ICD-10-CM

## 2015-10-14 DIAGNOSIS — L281 Prurigo nodularis: Secondary | ICD-10-CM | POA: Diagnosis not present

## 2015-10-14 DIAGNOSIS — I5022 Chronic systolic (congestive) heart failure: Secondary | ICD-10-CM

## 2015-10-14 DIAGNOSIS — L720 Epidermal cyst: Secondary | ICD-10-CM | POA: Diagnosis not present

## 2015-10-14 NOTE — Progress Notes (Signed)
Remote ICD transmission.   

## 2015-10-15 ENCOUNTER — Ambulatory Visit (INDEPENDENT_AMBULATORY_CARE_PROVIDER_SITE_OTHER): Payer: Medicare Other | Admitting: Endocrinology

## 2015-10-15 ENCOUNTER — Telehealth: Payer: Self-pay | Admitting: Interventional Cardiology

## 2015-10-15 ENCOUNTER — Encounter: Payer: Self-pay | Admitting: Endocrinology

## 2015-10-15 VITALS — BP 118/70 | HR 65 | Wt 261.0 lb

## 2015-10-15 DIAGNOSIS — E1165 Type 2 diabetes mellitus with hyperglycemia: Secondary | ICD-10-CM | POA: Diagnosis not present

## 2015-10-15 DIAGNOSIS — Z794 Long term (current) use of insulin: Secondary | ICD-10-CM

## 2015-10-15 NOTE — Telephone Encounter (Signed)
The pts wife called to report the pts weekly weights. Noted.

## 2015-10-15 NOTE — Progress Notes (Signed)
Patient ID: [name], male   DOB: Nov 27, 1940, 75 y.o.   MRN: [id number]    Reason for Appointment : F/u for Type 2 Diabetes  History of Present Illness          Diagnosis: Type 2 diabetes mellitus, date of diagnosis: 2008       Past history: He was initially treated with Metformin and also given Amayl at some point. With this he had fair control of his diabetes with A1c had usually been over 7% except once in 2013.  He was taken off metformin probably in March this year after a hospitalization, possibly because of renal dysfunction He was started on Levemir insulin in May when his blood sugars were significantly higher, fasting readings averaging 220.  Levemir progressively increased and was on 35 units at night since 12/20/12 He has not been on any other medications either orally or injectable for his diabetes Because of his A1c of over 10% in 10/14 on basal insulin alone he was started on NovoLog with each meal in 10/14  Recent history:     INSULIN regimen is described as: Toujeo 40 q am Humalog 14 lunch-- 18 units ac supper  With switching from Levemir twice a day to Toujeo his blood sugars are significantly better and A1c has gone down to 6.9, previously 7.9    Current management, blood sugar patterns and problems identified:   His fasting blood sugars are fairly good with blood sugars just over 100 usually.  Occasionally will take 42 units of Toujeo instead of 40 when blood sugars are slightly higher  He did have high readings when he had steroids in 5/17 but not recently none has only occasional spikes in blood sugars after meals based on his diet  Again not able to do much physical activity because of his significant back pain issues  No hypoglycemia  He is very compliant with taking his insulin as directed  Compliance with the medical regimen: Good   Glucose monitoring:  done  Up to 3  times a day        Glucometer:  Accu-Chek    Blood Glucose readings  from download as follows  PRE-MEAL Fasting Lunch Dinner Bedtime Overall  Glucose range: 100-154       Mean/median: 132     141    POST-MEAL PC Breakfast PC Lunch PC Dinner  Glucose range:  107-266  103-209   Mean/median:  158  151     Self-care: The diet that the patient has been following is: Smaller portions and relatively balanced meals  Meals: 2 meals per day. Noon and 6 pm  Low fat, trying to eat more salads. Avoiding drinks with sugar and no juices        Physical activity: exercise: Minimal, limited by back pain           Dietician visit: Most recent: Unknown.  CDE visit: 11/14                  Weight control: This appears to be fluctuating periodically based on his fluid status  Wt Readings from Last 3 Encounters:  10/15/15 261 lb (118.4 kg)  10/09/15 256 lb (116.1 kg)  09/30/15 259 lb (117.5 kg)    Lab Results  Component Value Date   HGBA1C 6.9 (H) 10/12/2015   HGBA1C 7.9 (H) 06/09/2015   HGBA1C 7.2 (H) 03/03/2015   Lab Results  Component Value Date   MICROALBUR 1.2 10/12/2015  LDLCALC 73 10/12/2015   CREATININE 1.97 (H) 10/12/2015       Medication List       Accurate as of 10/15/15 11:59 PM. Always use your most recent med list.          ACCU-CHEK AVIVA PLUS test strip Generic drug:  glucose blood USE ONE STRIP TO CHECK GLUCOSE THREE TIMES DAILY AS DIRECTED   ACCU-CHEK FASTCLIX LANCETS Misc USE ONE  TO CHECK BLOOD SUGAR THREE TIMES DAILY   acetaminophen 325 MG tablet Commonly known as:  TYLENOL Take 325 mg by mouth every morning.   allopurinol 100 MG tablet Commonly known as:  ZYLOPRIM Take 2 tablets (200 mg total) by mouth daily.   amLODipine 5 MG tablet Commonly known as:  NORVASC Take 2.5 mg by mouth daily.   aspirin 81 MG tablet Take 81 mg by mouth once a week.   atorvastatin 10 MG tablet Commonly known as:  LIPITOR Take 1 tablet (10 mg total) by mouth daily.   carboxymethylcellulose 0.5 % Soln Commonly known as:  REFRESH  PLUS Place 2 drops into both eyes daily.   carvedilol 6.25 MG tablet Commonly known as:  COREG Take 1 tablet (6.25 mg total) by mouth 2 (two) times daily.   chlorthalidone 25 MG tablet Commonly known as:  HYGROTON Take 1 tablet daily when weight reaches 255 lbs or greater ONLY!!   Cholecalciferol 10000 units Caps Take 1,000 Units by mouth daily.   colchicine 0.6 MG tablet Take 1 tablet (0.6 mg total) by mouth daily.   furosemide 80 MG tablet Commonly known as:  LASIX TAKE ONE TABLET BY MOUTH TWICE DAILY   gabapentin 300 MG capsule Commonly known as:  NEURONTIN Take 300 mg by mouth 4 (four) times daily. LEG PAIN   Insulin Glargine 300 UNIT/ML Sopn Commonly known as:  TOUJEO SOLOSTAR Inject 40 Units into the skin daily.   insulin lispro 100 UNIT/ML injection Commonly known as:  HUMALOG 12-14 units into the skin before lunch and 18 units before supper/   ipratropium 0.06 % nasal spray Commonly known as:  ATROVENT Place 1 spray into both nostrils daily.   losartan 50 MG tablet Commonly known as:  COZAAR TAKE ONE TABLET BY MOUTH ONCE DAILY   methocarbamol 750 MG tablet Commonly known as:  ROBAXIN Take 1 tablet (750 mg total) by mouth 2 (two) times daily.   naphazoline-pheniramine 0.025-0.3 % ophthalmic solution Commonly known as:  NAPHCON-A Place 1 drop into both eyes daily.   nitroGLYCERIN 0.4 MG SL tablet Commonly known as:  NITROSTAT Place 1 tablet (0.4 mg total) under the tongue every 5 (five) minutes as needed for chest pain (MAX 3 TABLETS).   potassium chloride SA 20 MEQ tablet Commonly known as:  K-DUR,KLOR-CON Take 30 mEq by mouth 2 (two) times daily. TAKE 1.5 TWICE DAILY TO TOTAL 30 MEQ   warfarin 2.5 MG tablet Commonly known as:  COUMADIN Take 1 tablet (2.5 mg total) by mouth as directed.       Allergies:  Allergies  Allergen Reactions  . Avelox [Moxifloxacin Hcl In Nacl] Swelling, Rash and Other (See Comments)    Patient became hypotensive  after infusion started Because of a history of documented adverse serious drug reaction;Medi Alert bracelet  is recommended  . Penicillins Anaphylaxis, Other (See Comments) and Swelling    REACTION: anaphylaxis Because of a history of documented adverse serious drug reaction;Medi Alert bracelet  is recommended    Past Medical History:  Diagnosis Date  .  AAA (abdominal aortic aneurysm) [facility])    Surgical repair  . Adenomatous colon polyp   . Alcohol ingestion of more than four drinks per week    Excess beer  not a dependency problem  . Aortic valve sclerosis   . Arthritis   . Ascending aortic aneurysm ([place]) 05/10/2014  . Atrial fibrillation ([place])    Previous long-term amiodarone therapy with multiple cardioversions / amiodarone stopped September, 2009  . Atrial flutter [facility])    Started November, 2010, Left-sided and cannot ablate  . Bony abnormality    Patient's manubrium is slightly displaced to the right  . CAD (coronary artery disease)    Catheterization July, 2008... name and vein grafts patent but low cardiac output  . Cardiomyopathy    Ischemic... ICD  . Carotid artery disease ([place])    Doppler, December, 2013, 0-39% bilateral  . Chronotropic incompetence    IV pacing rate adjusted  . Combined systolic and diastolic CHF   . COPD (chronic obstructive pulmonary disease) ([place])   . Diabetes mellitus   . Discolored skin   . Diverticulosis   . Drug therapy    Redness and swelling with Avelox infusion May 24, 2011  . Ejection fraction    Ejection fraction has varied over time from 35-50%.,, Echoes are technically very difficult,,, EF 50%, echo, May 25, 2011, technically very difficult  . Eye abnormality    Ophthalmologist questions a clot in one of his eyes, May, 2012  . Gout   . Hyperlipidemia   . Hypertension   . Internal hemorrhoids   . Left atrial thrombus [facility])    Remote past... cardioversions done since that time  . Left ventricular ejection fraction less than 40%    . Mitral regurgitation    Mild echo  . Myocardial infarction ([place])   . Nasal drainage    Chronic  . Overweight(278.02)    November, 2012  . Pericardial effusion   . Pericardial effusion    November, 2011 .. decreased during hospitalization  . Pilonidal cyst   . Pleural effusion    Large loculated effusion on the left side November, 2011. This was tapped. It was exudative. Cytology revealed no cancer no proof of mesothelioma area pulmonary team felt that no further workup was needed  . Pleural thickening   . Pneumonia   . Polycythemia vera ([place]) 07/28/2014  . S/P AAA repair   . S/P ICD (internal cardiac defibrillator) procedure    Dr. Lovena Le 2009... by the pacing  . SOB (shortness of breath)    Large left effusion/ thoracentesis/hospitalization/November, 2011... exudated.. cytology negative.. Dr.Wert.. no proof of mesothelioma  . Spinal cord stimulator status    October, 2013  . Spinal stenosis    Surgery Dr.Elsner  . Thrombophlebitis of superficial veins of upper extremities    Possible venous stenosis from defibrillator  . Venous insufficiency    Toe discoloration chronic  . Ventral hernia    April, 2014, result of his abdominal surgery  . Warfarin anticoagulation   . Wide-complex tachycardia ([place])     Past Surgical History:  Procedure Laterality Date  . ABDOMINAL AORTIC ANEURYSM REPAIR  11/2002  . BACK SURGERY    . BI-VENTRICULAR PACEMAKER INSERTION (CRT-P)  02-11-2013   Pt with previously implanted MDT CRTD downgraded to CRTP by Dr Lovena Le 02-11-13  . COLONOSCOPY W/ POLYPECTOMY    . CORONARY ARTERY BYPASS GRAFT  2004  . IMPLANTABLE CARDIOVERTER DEFIBRILLATOR (ICD) GENERATOR CHANGE N/A 02/11/2013   Procedure: ICD GENERATOR CHANGE;  Surgeon: Evans Lance, MD;  Location[facility] C[facility] LAB;  Service: Cardiovascular;  Laterality: N/A;  . INCISION AND DRAINAGE ABSCESS / HEMATOMA OF BURSA / KNEE / THIGH    . LEG SURGERY Right   . LUMBAR FUSION    . pilonidal cyst removal    .  SURGERY SCROTAL / TESTICULAR      Family History  Problem Relation Age of Onset  . Hypertension Mother   . Stroke Mother   . Diabetes Father   . Coronary artery disease Father   . Other Father     DVT  . Colon cancer Neg Hx   . Diabetes Brother   . Heart attack Neg Hx     Social History:  reports that he quit smoking about 20 years ago. His smoking use included Cigarettes. He started smoking about 59 years ago. He has a 117.00 pack-year smoking history. He has never used smokeless tobacco. He reports that he drinks about 1.2 oz of alcohol per week . He reports that he does not use drugs.    Review of Systems       Lipids: Treated with Lipitor  only taking 10 mg, Followed by PCP and cardiologist Hyperlipidemia: LDL is under 100 but not under 70  and also has low HDL   Lab Results  Component Value Date   CHOL 143 10/12/2015   HDL 38.70 (L) 10/12/2015   LDLCALC 73 10/12/2015   TRIG 157.0 (H) 10/12/2015   CHOLHDL 4 10/12/2015       The blood pressure has been under control with current regimen of chlorthalidone, amlodipine and losartan Also he takes extra chlorthalidone when he has weight gain  Renal dysfunction: Creatinine has been persistently high   Lab Results  Component Value Date   CREATININE 1.97 (H) 10/12/2015   BUN 42 (H) 10/12/2015   NA 142 10/12/2015   K 3.8 10/12/2015   CL 101 10/12/2015   CO2 32 10/12/2015      Has known neuropathy: He has had pains and tingling in his feet and lower legs.  Mostly having some symptoms in day;  some early morning and during the night.   Takes gabapentin for this and extra tablet at bedtime    Physical Examination:  BP 118/70 (BP Location: Left Arm, Patient Position: Sitting)   Pulse 65   Wt 261 lb (118.4 kg)   SpO2 97%   BMI 35.40 kg/m      ASSESSMENT/PLAN:  Diabetes type 2 See history of present illness for detailed discussion of his current management, blood sugar patterns and problems  identified  Although his A1c is excellent at 6.9 he now says that he cannot afford his insulin and needs to trying use cheaper alternatives. His A1c has improved with Toujeo compared to Levemir even when Levemir was taken twice a day Also may have required a little less mealtime insulin He has fairly consistent fasting readings with once a day Toujeo Also most of his readings after meals are fairly good He is consistent with diet and weight is stable and usually  For now he can go back to Levemir 20 units twice a day but discussed needing to adjust the dose if fasting readings are consistently higher He also can use the Novolin R from [employer] instead of Humalog but take insulin 30 with before eating Discussed that he may need to adjust the dose if postprandial readings are not within the discussed range Also discussed that he can take  2 units more insulin at mealtimes if he is eating a large amount of carbohydrate or eating out such as on Sunday lunches He can reduce frequency of monitoring fasting He will call if he has any difficulties with control  Patient Instructions  Levemir will be 20 U 2x daily  Regular insulin 30 min before meal, Relion brand       Daralyn Bert 10/16/2015, 8:00 AM

## 2015-10-15 NOTE — Telephone Encounter (Signed)
Follow Up:    Pt's wife said she was supposed to call you back today.

## 2015-10-15 NOTE — Patient Instructions (Signed)
Levemir will be 20 U 2x daily  Regular insulin 30 min before meal, Relion brand

## 2015-10-16 ENCOUNTER — Encounter: Payer: Self-pay | Admitting: Internal Medicine

## 2015-10-16 ENCOUNTER — Encounter: Payer: Self-pay | Admitting: Cardiology

## 2015-10-19 LAB — CUP PACEART REMOTE DEVICE CHECK
Battery Remaining Longevity: 74 mo
Brady Statistic AP VS Percent: 0 %
Brady Statistic AS VP Percent: 11.52 %
Brady Statistic AS VS Percent: 88.48 %
Brady Statistic RV Percent Paced: 15.61 %
HighPow Impedance: 76 Ohm
Implantable Lead Implant Date: 20090121
Implantable Lead Location: 753859
Implantable Lead Model: 6935
Lead Channel Impedance Value: 342 Ohm
Lead Channel Impedance Value: 4047 Ohm
Lead Channel Impedance Value: 4047 Ohm
Lead Channel Impedance Value: 437 Ohm
Lead Channel Impedance Value: 456 Ohm
Lead Channel Sensing Intrinsic Amplitude: 1 mV
Lead Channel Setting Pacing Amplitude: 2.5 V
MDC IDC LEAD IMPLANT DT: 20090121
MDC IDC LEAD IMPLANT DT: 20090121
MDC IDC LEAD LOCATION: 753858
MDC IDC LEAD LOCATION: 753860
MDC IDC MSMT BATTERY VOLTAGE: 2.97 V
MDC IDC MSMT LEADCHNL LV IMPEDANCE VALUE: 494 Ohm
MDC IDC MSMT LEADCHNL RA SENSING INTR AMPL: 1 mV
MDC IDC MSMT LEADCHNL RV PACING THRESHOLD AMPLITUDE: 0.75 V
MDC IDC MSMT LEADCHNL RV PACING THRESHOLD PULSEWIDTH: 0.4 ms
MDC IDC MSMT LEADCHNL RV SENSING INTR AMPL: 13.625 mV
MDC IDC MSMT LEADCHNL RV SENSING INTR AMPL: 13.625 mV
MDC IDC SESS DTM: 20170726041704
MDC IDC SET LEADCHNL RV PACING PULSEWIDTH: 0.4 ms
MDC IDC SET LEADCHNL RV SENSING SENSITIVITY: 0.3 mV
MDC IDC STAT BRADY AP VP PERCENT: 0 %
MDC IDC STAT BRADY RA PERCENT PACED: 0 %

## 2015-10-28 DIAGNOSIS — D1801 Hemangioma of skin and subcutaneous tissue: Secondary | ICD-10-CM | POA: Diagnosis not present

## 2015-10-29 ENCOUNTER — Ambulatory Visit (INDEPENDENT_AMBULATORY_CARE_PROVIDER_SITE_OTHER): Payer: Medicare Other | Admitting: *Deleted

## 2015-10-29 DIAGNOSIS — Z7901 Long term (current) use of anticoagulants: Secondary | ICD-10-CM | POA: Diagnosis not present

## 2015-10-29 DIAGNOSIS — I4891 Unspecified atrial fibrillation: Secondary | ICD-10-CM | POA: Diagnosis not present

## 2015-10-29 LAB — POCT INR: INR: 2.4

## 2015-11-02 ENCOUNTER — Other Ambulatory Visit: Payer: Self-pay | Admitting: Cardiology

## 2015-11-02 ENCOUNTER — Telehealth: Payer: Self-pay | Admitting: Endocrinology

## 2015-11-02 NOTE — Telephone Encounter (Signed)
PT wife requests call back regarding the directions for the Levemir.

## 2015-11-04 ENCOUNTER — Other Ambulatory Visit (HOSPITAL_BASED_OUTPATIENT_CLINIC_OR_DEPARTMENT_OTHER): Payer: Medicare Other

## 2015-11-04 ENCOUNTER — Ambulatory Visit (HOSPITAL_BASED_OUTPATIENT_CLINIC_OR_DEPARTMENT_OTHER): Payer: Medicare Other

## 2015-11-04 ENCOUNTER — Ambulatory Visit (HOSPITAL_BASED_OUTPATIENT_CLINIC_OR_DEPARTMENT_OTHER): Payer: Medicare Other | Admitting: Family

## 2015-11-04 ENCOUNTER — Other Ambulatory Visit: Payer: Self-pay

## 2015-11-04 VITALS — BP 121/72 | HR 79 | Temp 98.0°F | Resp 18

## 2015-11-04 VITALS — BP 113/62 | HR 71 | Temp 98.0°F | Resp 20 | Wt 266.0 lb

## 2015-11-04 DIAGNOSIS — D45 Polycythemia vera: Secondary | ICD-10-CM | POA: Diagnosis not present

## 2015-11-04 DIAGNOSIS — D582 Other hemoglobinopathies: Secondary | ICD-10-CM

## 2015-11-04 LAB — CBC WITH DIFFERENTIAL (CANCER CENTER ONLY)
BASO#: 0 10*3/uL (ref 0.0–0.2)
BASO%: 0.4 % (ref 0.0–2.0)
EOS ABS: 0.1 10*3/uL (ref 0.0–0.5)
EOS%: 1 % (ref 0.0–7.0)
HEMATOCRIT: 45.7 % (ref 38.7–49.9)
HEMOGLOBIN: 15.7 g/dL (ref 13.0–17.1)
LYMPH#: 1.2 10*3/uL (ref 0.9–3.3)
LYMPH%: 15 % (ref 14.0–48.0)
MCH: 31.8 pg (ref 28.0–33.4)
MCHC: 34.4 g/dL (ref 32.0–35.9)
MCV: 93 fL (ref 82–98)
MONO#: 0.8 10*3/uL (ref 0.1–0.9)
MONO%: 9.6 % (ref 0.0–13.0)
NEUT%: 74 % (ref 40.0–80.0)
NEUTROS ABS: 5.8 10*3/uL (ref 1.5–6.5)
Platelets: 174 10*3/uL (ref 145–400)
RBC: 4.94 10*6/uL (ref 4.20–5.70)
RDW: 14.3 % (ref 11.1–15.7)
WBC: 7.8 10*3/uL (ref 4.0–10.0)

## 2015-11-04 LAB — COMPREHENSIVE METABOLIC PANEL
ALBUMIN: 3.3 g/dL — AB (ref 3.5–5.0)
ALK PHOS: 70 U/L (ref 40–150)
ALT: 12 U/L (ref 0–55)
ANION GAP: 12 meq/L — AB (ref 3–11)
AST: 15 U/L (ref 5–34)
BILIRUBIN TOTAL: 0.81 mg/dL (ref 0.20–1.20)
BUN: 33.9 mg/dL — ABNORMAL HIGH (ref 7.0–26.0)
CALCIUM: 9.2 mg/dL (ref 8.4–10.4)
CO2: 24 mEq/L (ref 22–29)
Chloride: 104 mEq/L (ref 98–109)
Creatinine: 1.9 mg/dL — ABNORMAL HIGH (ref 0.7–1.3)
EGFR: 33 mL/min/{1.73_m2} — AB (ref >90)
Glucose: 130 mg/dl (ref 70–140)
POTASSIUM: 4.2 meq/L (ref 3.5–5.1)
Sodium: 140 mEq/L (ref 136–145)
TOTAL PROTEIN: 6.5 g/dL (ref 6.4–8.3)

## 2015-11-04 MED ORDER — SODIUM CHLORIDE 0.9 % IV SOLN
500.0000 mL | Freq: Once | INTRAVENOUS | Status: AC
  Administered 2015-11-04: 500 mL via INTRAVENOUS

## 2015-11-04 MED ORDER — POTASSIUM CHLORIDE CRYS ER 20 MEQ PO TBCR: 30.0000 meq | EXTENDED_RELEASE_TABLET | Freq: Two times a day (BID) | ORAL | 3 refills | Status: DC

## 2015-11-04 MED ORDER — CHLORTHALIDONE 25 MG PO TABS: ORAL_TABLET | ORAL | 3 refills | Status: DC

## 2015-11-04 NOTE — Telephone Encounter (Signed)
Requested a call back from the pt to discuss.  

## 2015-11-04 NOTE — Progress Notes (Signed)
Hematology and Oncology Follow Up Visit  [name] [id number] [birth year] 75 y.o. 11/04/2015   Principle Diagnosis:  Polycythemia vera  Current Therapy:   Phlebotomy to maintain hematocrit below 45%. Aspirin 81 mg by mouth daily    Interim History:  [name] is here today with his wife for a follow-up. He is doing well but has had some intermittent fatigue. We will add iron studies to his lab work today and see if he is deficient.  No fever, rash, dizziness, headaches, blurred vision, chest pain, palpitations, abdominal pain or changes in bowel or bladder habits.  No lymphadenopathy found on exam.  No episodes of bleeding. He is on coumadin daily for atrial fib and also takes 1 baby aspirin a week. He still bruises easily, no petechiae.  He has some swelling in his right ankle due to an old injury that comes and goes. He has no other swelling and no tenderness in his extremities. The numbness and tingling in his feet is unchanged. No new aches or pains.  He uses his cane to ambulate. No falls or syncopal episodes.  He has maintained a good appetite and is staying well hydrated. His weight is stable.   Medications:    Medication List       Accurate as of 11/04/15  2:25 PM. Always use your most recent med list.          ACCU-CHEK AVIVA PLUS test strip Generic drug:  glucose blood USE ONE STRIP TO CHECK GLUCOSE THREE TIMES DAILY AS DIRECTED   ACCU-CHEK FASTCLIX LANCETS Misc USE ONE  TO CHECK BLOOD SUGAR THREE TIMES DAILY   acetaminophen 325 MG tablet Commonly known as:  TYLENOL Take 325 mg by mouth every 4 (four) hours as needed.   allopurinol 100 MG tablet Commonly known as:  ZYLOPRIM Take 2 tablets (200 mg total) by mouth daily.   amLODipine 5 MG tablet Commonly known as:  NORVASC Take 2.5 mg by mouth daily.   aspirin 81 MG tablet Take 81 mg by mouth once a week.   atorvastatin 10 MG tablet Commonly known as:  LIPITOR Take 1 tablet (10 mg total) by  mouth daily.   carboxymethylcellulose 0.5 % Soln Commonly known as:  REFRESH PLUS Place 2 drops into both eyes daily.   carvedilol 6.25 MG tablet Commonly known as:  COREG Take 1 tablet (6.25 mg total) by mouth 2 (two) times daily.   chlorthalidone 25 MG tablet Commonly known as:  HYGROTON Take 1 tablet daily when weight reaches 255 lbs or greater ONLY!!   Cholecalciferol 10000 units Caps Take 1,000 Units by mouth daily.   colchicine 0.6 MG tablet Take 1 tablet (0.6 mg total) by mouth daily.   furosemide 80 MG tablet Commonly known as:  LASIX TAKE ONE TABLET BY MOUTH TWICE DAILY   gabapentin 300 MG capsule Commonly known as:  NEURONTIN Take 300 mg by mouth 4 (four) times daily. LEG PAIN   Insulin Glargine 300 UNIT/ML Sopn Commonly known as:  TOUJEO SOLOSTAR Inject 40 Units into the skin daily.   insulin lispro 100 UNIT/ML injection Commonly known as:  HUMALOG 12-14 units into the skin before lunch and 18 units before supper/   ipratropium 0.06 % nasal spray Commonly known as:  ATROVENT Place 1 spray into both nostrils daily.   losartan 50 MG tablet Commonly known as:  COZAAR TAKE ONE TABLET BY MOUTH ONCE DAILY   methocarbamol 750 MG tablet Commonly known as:  ROBAXIN Take 1  tablet (750 mg total) by mouth 2 (two) times daily.   naphazoline-pheniramine 0.025-0.3 % ophthalmic solution Commonly known as:  NAPHCON-A Place 1 drop into both eyes daily.   nitroGLYCERIN 0.4 MG SL tablet Commonly known as:  NITROSTAT Place 1 tablet (0.4 mg total) under the tongue every 5 (five) minutes as needed for chest pain (MAX 3 TABLETS).   potassium chloride SA 20 MEQ tablet Commonly known as:  K-DUR,KLOR-CON Take 30 mEq by mouth 2 (two) times daily. TAKE 1.5 TWICE DAILY TO TOTAL 30 MEQ   warfarin 2.5 MG tablet Commonly known as:  COUMADIN Take 1 tablet (2.5 mg total) by mouth as directed.       Allergies:  Allergies  Allergen Reactions  . Avelox [Moxifloxacin Hcl In  Nacl] Swelling, Rash and Other (See Comments)    Patient became hypotensive after infusion started Because of a history of documented adverse serious drug reaction;Medi Alert bracelet  is recommended  . Penicillins Anaphylaxis, Other (See Comments) and Swelling    REACTION: anaphylaxis Because of a history of documented adverse serious drug reaction;Medi Alert bracelet  is recommended    Past Medical History, Surgical history, Social history, and Family History were reviewed and updated.  Review of Systems: All other 10 point review of systems is negative.   Physical Exam:  weight is 266 lb (120.7 kg). His oral temperature is 98 F (36.7 C). His blood pressure is 113/62 and his pulse is 71. His respiration is 20.   Wt Readings from Last 3 Encounters:  11/04/15 266 lb (120.7 kg)  10/15/15 261 lb (118.4 kg)  10/09/15 256 lb (116.1 kg)    Ocular: Sclerae unicteric, pupils equal, round and reactive to light Ear-nose-throat: Oropharynx clear, dentition fair Lymphatic: No cervical supraclavicular or axillary adenopathy Lungs no rales or rhonchi, good excursion bilaterally Heart regular rate and rhythm, no murmur appreciated Abd soft, nontender, positive bowel sounds, no liver or spleen tip palpated on exam, no fluid wave MSK no focal spinal tenderness, no joint edema Neuro: non-focal, well-oriented, appropriate affect Breast: Deferred  Lab Results  Component Value Date   WBC 7.8 11/04/2015   HGB 15.7 11/04/2015   HCT 45.7 11/04/2015   MCV 93 11/04/2015   PLT 174 11/04/2015   Lab Results  Component Value Date   FERRITIN 94 05/14/2015   IRON 135 05/14/2015   TIBC 355 05/14/2015   UIBC 220 05/14/2015   IRONPCTSAT 38 05/14/2015   Lab Results  Component Value Date   RETICCTPCT 1.4 04/02/2014   RBC 4.94 11/04/2015   RETICCTABS 75.2 04/02/2014   No results found for: KPAFRELGTCHN, LAMBDASER, KAPLAMBRATIO No results found for: IGGSERUM, IGA, IGMSERUM No results found for:  Kathrynn Ducking, MSPIKE, SPEI   Chemistry      Component Value Date/Time   NA 142 10/12/2015 1400   NA 141 05/14/2015 1324   K 3.8 10/12/2015 1400   K 4.3 05/14/2015 1324   CL 101 10/12/2015 1400   CL 100 04/22/2014 1350   CO2 32 10/12/2015 1400   CO2 31 (H) 05/14/2015 1324   BUN 42 (H) 10/12/2015 1400   BUN 46.9 (H) 05/14/2015 1324   CREATININE 1.97 (H) 10/12/2015 1400   CREATININE 1.58 (H) 09/08/2015 0956   CREATININE 2.2 (H) 05/14/2015 1324      Component Value Date/Time   CALCIUM 9.3 10/12/2015 1400   CALCIUM 9.3 05/14/2015 1324   ALKPHOS 52 10/12/2015 1400   ALKPHOS 65 05/14/2015 1324  AST 12 10/12/2015 1400   AST 13 05/14/2015 1324   ALT 12 10/12/2015 1400   ALT 14 05/14/2015 1324   BILITOT 0.7 10/12/2015 1400   BILITOT 0.95 05/14/2015 1324     Impression and Plan: [name] is 75 year old male with polycythemia vera. He has had some intermittent fatigue. His Hct at this time is 45.7 so we will go ahead with his phlebotomy. He will receive replacement fluids afterwards.  We will see what his iron studies show and if that is possibly the cause of his fatigue.  We will then plan to see him back in 2 months for labs and follow-up.  Both he and his family know to call with any questions or concerns. We can certainly see him sooner if need be.   Eliezer Bottom, NP 8/16/20172:25 PM

## 2015-11-04 NOTE — Progress Notes (Signed)
[name] presents today for phlebotomy per MD orders. Phlebotomy procedure started at 1500 and ended at 1515. 500 grams removed. Patient observed for 30 minutes after procedure without any incident. Patient tolerated procedure well. IV needle removed intact.

## 2015-11-04 NOTE — Patient Instructions (Signed)

## 2015-11-05 ENCOUNTER — Encounter: Payer: Self-pay | Admitting: *Deleted

## 2015-11-05 ENCOUNTER — Telehealth: Payer: Self-pay | Admitting: Internal Medicine

## 2015-11-05 LAB — FERRITIN: Ferritin: 45 ng/ml (ref 22–316)

## 2015-11-05 LAB — IRON AND TIBC
%SAT: 25 % (ref 20–55)
Iron: 94 ug/dL (ref 42–163)
TIBC: 369 ug/dL (ref 202–409)
UIBC: 275 ug/dL (ref 117–376)

## 2015-11-05 NOTE — Telephone Encounter (Signed)
I contacted the pt and advised per the last office note to take 20 units twice a day of the Levemir. Pt voiced understanding.

## 2015-11-05 NOTE — Telephone Encounter (Signed)
Patient ask you to return her call

## 2015-11-05 NOTE — Telephone Encounter (Signed)
Patient returning your call.

## 2015-11-05 NOTE — Telephone Encounter (Signed)
Patient's wife needs someone to call her back concerning the patient's visit on 10/15/15 with Dr. Dwyane Dee. She has questions about the instructions on the check out paper that the doctor gave them.

## 2015-11-12 DIAGNOSIS — R490 Dysphonia: Secondary | ICD-10-CM | POA: Diagnosis not present

## 2015-11-12 DIAGNOSIS — K219 Gastro-esophageal reflux disease without esophagitis: Secondary | ICD-10-CM | POA: Diagnosis not present

## 2015-11-25 ENCOUNTER — Telehealth: Payer: Self-pay | Admitting: Interventional Cardiology

## 2015-11-25 NOTE — Telephone Encounter (Signed)
New Message  Pt wife call requesting to speak with Rn. Pt wife did not want to go into detail about what the call was referring to. Please call back to discuss.

## 2015-11-26 NOTE — Telephone Encounter (Signed)
**Note De-Identified [name] Obfuscation** The pts wife called to report the pts weekly weights. Weights have been noted.

## 2015-11-28 ENCOUNTER — Other Ambulatory Visit: Payer: Self-pay | Admitting: Endocrinology

## 2015-12-03 ENCOUNTER — Other Ambulatory Visit: Payer: Self-pay | Admitting: Endocrinology

## 2015-12-03 DIAGNOSIS — R490 Dysphonia: Secondary | ICD-10-CM | POA: Diagnosis not present

## 2015-12-03 DIAGNOSIS — K219 Gastro-esophageal reflux disease without esophagitis: Secondary | ICD-10-CM | POA: Diagnosis not present

## 2015-12-03 DIAGNOSIS — Z87891 Personal history of nicotine dependence: Secondary | ICD-10-CM | POA: Diagnosis not present

## 2015-12-10 ENCOUNTER — Ambulatory Visit (INDEPENDENT_AMBULATORY_CARE_PROVIDER_SITE_OTHER): Payer: Medicare Other | Admitting: *Deleted

## 2015-12-10 DIAGNOSIS — Z7901 Long term (current) use of anticoagulants: Secondary | ICD-10-CM

## 2015-12-10 DIAGNOSIS — I4891 Unspecified atrial fibrillation: Secondary | ICD-10-CM

## 2015-12-10 LAB — POCT INR: INR: 2

## 2015-12-30 ENCOUNTER — Other Ambulatory Visit (HOSPITAL_BASED_OUTPATIENT_CLINIC_OR_DEPARTMENT_OTHER): Payer: Medicare Other

## 2015-12-30 ENCOUNTER — Ambulatory Visit (HOSPITAL_BASED_OUTPATIENT_CLINIC_OR_DEPARTMENT_OTHER): Payer: Medicare Other

## 2015-12-30 ENCOUNTER — Encounter: Payer: Self-pay | Admitting: Family

## 2015-12-30 ENCOUNTER — Ambulatory Visit (HOSPITAL_BASED_OUTPATIENT_CLINIC_OR_DEPARTMENT_OTHER): Payer: Medicare Other | Admitting: Family

## 2015-12-30 VITALS — BP 117/62 | HR 87 | Temp 97.7°F | Resp 18 | Ht 72.0 in | Wt 263.0 lb

## 2015-12-30 DIAGNOSIS — D45 Polycythemia vera: Secondary | ICD-10-CM

## 2015-12-30 DIAGNOSIS — E1159 Type 2 diabetes mellitus with other circulatory complications: Secondary | ICD-10-CM | POA: Diagnosis not present

## 2015-12-30 DIAGNOSIS — Z86718 Personal history of other venous thrombosis and embolism: Secondary | ICD-10-CM

## 2015-12-30 DIAGNOSIS — D582 Other hemoglobinopathies: Secondary | ICD-10-CM

## 2015-12-30 LAB — COMPREHENSIVE METABOLIC PANEL (CC13)
ALT: 12 IU/L (ref 0–44)
AST (SGOT): 14 IU/L (ref 0–40)
Albumin, Serum: 3.9 g/dL (ref 3.5–4.8)
Albumin/Globulin Ratio: 1.5 (ref 1.2–2.2)
Alkaline Phosphatase, S: 78 IU/L (ref 39–117)
BUN/Creatinine Ratio: 23 (ref 10–24)
BUN: 37 mg/dL — AB (ref 8–27)
Bilirubin Total: 0.6 mg/dL (ref 0.0–1.2)
CALCIUM: 9.1 mg/dL (ref 8.6–10.2)
Carbon Dioxide, Total: 28 mmol/L (ref 18–29)
Chloride, Ser: 100 mmol/L (ref 96–106)
Creatinine, Ser: 1.6 mg/dL — ABNORMAL HIGH (ref 0.76–1.27)
GFR, EST AFRICAN AMERICAN: 48 mL/min/{1.73_m2} — AB (ref >59)
GFR, EST NON AFRICAN AMERICAN: 42 mL/min/{1.73_m2} — AB (ref >59)
GLUCOSE: 116 mg/dL — AB (ref 65–99)
Globulin, Total: 2.6 g/dL (ref 1.5–4.5)
Potassium, Ser: 3.7 mmol/L (ref 3.5–5.2)
Sodium: 138 mmol/L (ref 134–144)
TOTAL PROTEIN: 6.5 g/dL (ref 6.0–8.5)

## 2015-12-30 LAB — CBC WITH DIFFERENTIAL (CANCER CENTER ONLY)
BASO#: 0 10*3/uL (ref 0.0–0.2)
BASO%: 0.4 % (ref 0.0–2.0)
EOS%: 0.8 % (ref 0.0–7.0)
Eosinophils Absolute: 0.1 10*3/uL (ref 0.0–0.5)
HCT: 46.7 % (ref 38.7–49.9)
HGB: 15.5 g/dL (ref 13.0–17.1)
LYMPH#: 1.1 10*3/uL (ref 0.9–3.3)
LYMPH%: 11.7 % — AB (ref 14.0–48.0)
MCH: 29.7 pg (ref 28.0–33.4)
MCHC: 33.2 g/dL (ref 32.0–35.9)
MCV: 90 fL (ref 82–98)
MONO#: 0.9 10*3/uL (ref 0.1–0.9)
MONO%: 9.8 % (ref 0.0–13.0)
NEUT#: 6.9 10*3/uL — ABNORMAL HIGH (ref 1.5–6.5)
NEUT%: 77.3 % (ref 40.0–80.0)
PLATELETS: 180 10*3/uL (ref 145–400)
RBC: 5.22 10*6/uL (ref 4.20–5.70)
RDW: 15 % (ref 11.1–15.7)
WBC: 9 10*3/uL (ref 4.0–10.0)

## 2015-12-30 NOTE — Patient Instructions (Signed)
Therapeutic Phlebotomy, Care After  Refer to this sheet in the next few weeks. These instructions provide you with information about caring for yourself after your procedure. Your health care provider may also give you more specific instructions. Your treatment has been planned according to current medical practices, but problems sometimes occur. Call your health care provider if you have any problems or questions after your procedure.  WHAT TO EXPECT AFTER THE PROCEDURE  After your procedure, it is common to have:   Light-headedness or dizziness. You may feel faint.   Nausea.   Tiredness.  HOME CARE INSTRUCTIONS  Activities   Return to your normal activities as directed by your health care provider. Most people can go back to their normal activities right away.   Avoid strenuous physical activity and heavy lifting or pulling for about 5 hours after the procedure. Do not lift anything that is heavier than 10 lb (4.5 kg).   Athletes should avoid strenuous exercise for at least 12 hours.   Change positions slowly for the remainder of the day. This will help to prevent light-headedness or fainting.   If you feel light-headed, lie down until the feeling goes away.  Eating and Drinking   Be sure to eat well-balanced meals for the next 24 hours.   Drink enough fluid to keep your urine clear or pale yellow.   Avoid drinking alcohol on the day that you had the procedure.  Care of the Needle Insertion Site   Keep your bandage dry. You can remove the bandage after about 5 hours or as directed by your health care provider.   If you have bleeding from the needle insertion site, elevate your arm and press firmly on the site until the bleeding stops.   If you have bruising at the site, apply ice to the area:   Put ice in a plastic bag.   Place a towel between your skin and the bag.   Leave the ice on for 20 minutes, 2-3 times a day for the first 24 hours.   If the swelling does not go away after 24 hours, apply  a warm, moist washcloth to the area for 20 minutes, 2-3 times a day.  General Instructions   Avoid smoking for at least 30 minutes after the procedure.   Keep all follow-up visits as directed by your health care provider. It is important to continue with further therapeutic phlebotomy treatments as directed.  SEEK MEDICAL CARE IF:   You have redness, swelling, or pain at the needle insertion site.   You have fluid, blood, or pus coming from the needle insertion site.   You feel light-headed, dizzy, or nauseated, and the feeling does not go away.   You notice new bruising at the needle insertion site.   You feel weaker than normal.   You have a fever or chills.  SEEK IMMEDIATE MEDICAL CARE IF:   You have severe nausea or vomiting.   You have chest pain.   You have trouble breathing.    This information is not intended to replace advice given to you by your health care provider. Make sure you discuss any questions you have with your health care provider.    Document Released: 08/09/2010 Document Revised: 07/22/2014 Document Reviewed: 03/03/2014  Elsevier Interactive Patient Education 2016 [employer]

## 2015-12-30 NOTE — Progress Notes (Signed)
[name] presents today for phlebotomy per MD orders. Phlebotomy procedure started at 1455 and ended at 1504. 563mls removed. Patient observed for 30 minutes after procedure without any incident. Patient tolerated procedure well. IV needle removed intact.

## 2015-12-30 NOTE — Progress Notes (Signed)
Hematology and Oncology Follow Up Visit  [name] 07-07-1940 75 y.o. 12/30/2015   Principle Diagnosis:  Polycythemia vera  Current Therapy:   Phlebotomy to maintain hematocrit below 45%. Aspirin 81 mg by mouth daily    Interim History:  [name] is here today with his wife for a follow-up. He is having some mild fatigue and SOB with exertion. He states that he feels that he needs a phlebotomy.  His complexion is ruddy. Hct is 46.7.  No fever, rash, dizziness, headaches, vision changes, chest pain, palpitations, abdominal pain or changes in bowel or bladder habits.  He states that his atrial fib has not been an issue and he is doing well on Coumadin and 1 baby aspirin daily. No episodes of bleeding. He does bruise easily.  He has chronic swelling or the right ankle due to an old injury that is unchanged. No other swelling and no tenderness in his extremities. 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  He uses his cane to ambulate. No falls or syncopal episodes.  He has gout in his left pointer finger. He has colchicine at home and plans to start taking it this evening.   He is eating well and staying hydrated. He admits that he needs to cut back on the diet pop and drink more water. His weight is stable.  He states that his blood sugars have been better controlled and his Hgb A1c was 6.9.   Medications:    Medication List       Accurate as of 12/30/15  1:37 PM. Always use your most recent med list.          ACCU-CHEK AVIVA PLUS test strip Generic drug:  glucose blood USE ONE STRIP TO CHECK GLUCOSE THREE TIMES DAILY AS DIRECTED   ACCU-CHEK FASTCLIX LANCETS Misc USE ONE  TO CHECK GLUCOSE THREE TIMES DAILY   acetaminophen 325 MG tablet Commonly known as:  TYLENOL Take 325 mg by mouth every 4 (four) hours as needed.   allopurinol 100 MG tablet Commonly known as:  ZYLOPRIM Take 2 tablets (200 mg total) by mouth daily.   amLODipine 5 MG tablet Commonly  known as:  NORVASC Take 2.5 mg by mouth daily.   aspirin 81 MG tablet Take 81 mg by mouth once a week.   atorvastatin 10 MG tablet Commonly known as:  LIPITOR Take 1 tablet (10 mg total) by mouth daily.   carboxymethylcellulose 0.5 % Soln Commonly known as:  REFRESH PLUS Place 2 drops into both eyes daily.   carvedilol 6.25 MG tablet Commonly known as:  COREG Take 1 tablet (6.25 mg total) by mouth 2 (two) times daily.   chlorthalidone 25 MG tablet Commonly known as:  HYGROTON Take 1 tablet daily when weight reaches 255 lbs or greater.   Cholecalciferol 10000 units Caps Take 1,000 Units by mouth daily.   colchicine 0.6 MG tablet Take 1 tablet (0.6 mg total) by mouth daily.   furosemide 80 MG tablet Commonly known as:  LASIX TAKE ONE TABLET BY MOUTH TWICE DAILY   gabapentin 300 MG capsule Commonly known as:  NEURONTIN Take 300 mg by mouth 4 (four) times daily. LEG PAIN   gabapentin 300 MG capsule Commonly known as:  NEURONTIN TAKE ONE CAPSULE BY MOUTH 4 TIMES DAILY AS NEEDED FOR LEG PAIN   Insulin Glargine 300 UNIT/ML Sopn Commonly known as:  TOUJEO SOLOSTAR Inject 40 Units into the skin daily.   insulin lispro 100 UNIT/ML injection Commonly  known as:  HUMALOG 12-14 units into the skin before lunch and 18 units before supper/   ipratropium 0.06 % nasal spray Commonly known as:  ATROVENT Place 1 spray into both nostrils daily.   losartan 50 MG tablet Commonly known as:  COZAAR TAKE ONE TABLET BY MOUTH ONCE DAILY   methocarbamol 750 MG tablet Commonly known as:  ROBAXIN Take 1 tablet (750 mg total) by mouth 2 (two) times daily.   naphazoline-pheniramine 0.025-0.3 % ophthalmic solution Commonly known as:  NAPHCON-A Place 1 drop into both eyes daily.   nitroGLYCERIN 0.4 MG SL tablet Commonly known as:  NITROSTAT Place 1 tablet (0.4 mg total) under the tongue every 5 (five) minutes as needed for chest pain (MAX 3 TABLETS).   potassium chloride SA 20 MEQ  tablet Commonly known as:  K-DUR,KLOR-CON Take 1.5 tablets (30 mEq total) by mouth 2 (two) times daily. TAKE 1.5 TWICE DAILY TO TOTAL 30 MEQ   warfarin 2.5 MG tablet Commonly known as:  COUMADIN Take 1 tablet (2.5 mg total) by mouth as directed.       Allergies:  Allergies  Allergen Reactions  . Avelox [Moxifloxacin Hcl In Nacl] Swelling, Rash and Other (See Comments)    Patient became hypotensive after infusion started Because of a history of documented adverse serious drug reaction;Medi Alert bracelet  is recommended  . Penicillins Anaphylaxis, Other (See Comments) and Swelling    REACTION: anaphylaxis Because of a history of documented adverse serious drug reaction;Medi Alert bracelet  is recommended    Past Medical History, Surgical history, Social history, and Family History were reviewed and updated.  Review of Systems: All other 10 point review of systems is negative.   Physical Exam:  vitals were not taken for this visit.  Wt Readings from Last 3 Encounters:  11/04/15 266 lb (120.7 kg)  10/15/15 261 lb (118.4 kg)  10/09/15 256 lb (116.1 kg)    Ocular: Sclerae unicteric, pupils equal, round and reactive to light Ear-nose-throat: Oropharynx clear, dentition fair Lymphatic: No cervical supraclavicular or axillary adenopathy Lungs no rales or rhonchi, good excursion bilaterally Heart regular rate and rhythm, no murmur appreciated Abd soft, nontender, positive bowel sounds, no liver or spleen tip palpated on exam, no fluid wave MSK no focal spinal tenderness, no joint edema Neuro: non-focal, well-oriented, appropriate affect Breast: Deferred  Lab Results  Component Value Date   WBC 7.8 11/04/2015   HGB 15.7 11/04/2015   HCT 45.7 11/04/2015   MCV 93 11/04/2015   PLT 174 11/04/2015   Lab Results  Component Value Date   FERRITIN 45 11/04/2015   IRON 94 11/04/2015   TIBC 369 11/04/2015   UIBC 275 11/04/2015   IRONPCTSAT 25 11/04/2015   Lab Results    Component Value Date   RETICCTPCT 1.4 04/02/2014   RBC 4.94 11/04/2015   RETICCTABS 75.2 04/02/2014   No results found for: KPAFRELGTCHN, LAMBDASER, KAPLAMBRATIO No results found for: IGGSERUM, IGA, IGMSERUM No results found for: Ronnald Ramp, A1GS, A2GS, Violet Baldy, MSPIKE, SPEI   Chemistry      Component Value Date/Time   NA 140 11/04/2015 1330   K 4.2 11/04/2015 1330   CL 101 10/12/2015 1400   CL 100 04/22/2014 1350   CO2 24 11/04/2015 1330   BUN 33.9 (H) 11/04/2015 1330   CREATININE 1.9 (H) 11/04/2015 1330      Component Value Date/Time   CALCIUM 9.2 11/04/2015 1330   ALKPHOS 70 11/04/2015 1330   AST 15 11/04/2015  1330   ALT 12 11/04/2015 1330   BILITOT 0.81 11/04/2015 1330     Impression and Plan: Mr. Nee is 75 year old male with polycythemia vera. He is symptomatic at this time with fatigue and SOB with exertion. His Hct is 46.7 so we will phlebotomize him today.  We will give him replacement fluids afterwards if needed.  We will then plan to see him back in 2 months for labs and follow-up.  Both he and his family know to call with any questions or concerns. We can certainly see him sooner if need be.   Eliezer Bottom, NP 10/11/20171:37 PM

## 2015-12-31 LAB — IRON AND TIBC
%SAT: 20 % (ref 20–55)
IRON: 82 ug/dL (ref 42–163)
TIBC: 408 ug/dL (ref 202–409)
UIBC: 325 ug/dL (ref 117–376)

## 2015-12-31 LAB — FERRITIN: Ferritin: 40 ng/ml (ref 22–316)

## 2015-12-31 NOTE — Progress Notes (Signed)
Cardiology Office Note   Date:  01/01/2016   ID:  CAI PHIPPS, DOB 05/10/1940, MRN [id number]  PCP:  [name], MD    No chief complaint on file. CAD   Wt Readings from Last 3 Encounters:  01/01/16 261 lb (118.4 kg)  12/30/15 263 lb (119.3 kg)  11/04/15 266 lb (120.7 kg)       History of Present Illness: [name] is a 75 y.o. male  Who prefers to be called "[name]," who has a h/o CAD, MI. He had diaphoresis and diffuse pain with his MI. He ended up having CABG. His EF is in the 35-40% range. He has been managed for chronic left ventricular systolic dysfunction over the last several years by Dr. [name]. He has had issues with back pain and has had several injections. He weighs every day. He is in contact with my nurse Jeani Hawking regarding his weight. When his weight goes above 255 pounds, he takes a chlorthalidone tablet.  He denies any chest discomfort or shortness of breath. He is not having any trouble lying flat. He is not having any lower extremity edema.  He has been using the chlorthalidone more frequently. About every third day, he is using the chlorthalidone he does not feel short of breath or particularly swollen when he has to use the chlorthalidone. He is concerned about his kidney function.  He does not exercise due to chronic back pain. He has had multiple surgeries. He has used a recumbent bike in the past with some success.    Past Medical History:  Diagnosis Date  . AAA (abdominal aortic aneurysm) [facility])    Surgical repair  . Adenomatous colon polyp   . Alcohol ingestion of more than four drinks per week    Excess beer  not a dependency problem  . Aortic valve sclerosis   . Arthritis   . Ascending aortic aneurysm ([place]) 05/10/2014  . Atrial fibrillation ([place])    Previous long-term amiodarone therapy with multiple cardioversions / amiodarone stopped September, 2009  . Atrial flutter [facility])    Started November, 2010, Left-sided and cannot  ablate  . Bony abnormality    Patient's manubrium is slightly displaced to the right  . CAD (coronary artery disease)    Catheterization July, 2008... name and vein grafts patent but low cardiac output  . Cardiomyopathy    Ischemic... ICD  . Carotid artery disease ([place])    Doppler, December, 2013, 0-39% bilateral  . Chronotropic incompetence    IV pacing rate adjusted  . Combined systolic and diastolic CHF   . COPD (chronic obstructive pulmonary disease) ([place])   . Diabetes mellitus   . Discolored skin   . Diverticulosis   . Drug therapy    Redness and swelling with Avelox infusion May 24, 2011  . Ejection fraction    Ejection fraction has varied over time from 35-50%.,, Echoes are technically very difficult,,, EF 50%, echo, May 25, 2011, technically very difficult  . Eye abnormality    Ophthalmologist questions a clot in one of his eyes, May, 2012  . Gout   . Hyperlipidemia   . Hypertension   . Internal hemorrhoids   . Left atrial thrombus    Remote past... cardioversions done since that time  . Left ventricular ejection fraction less than 40%   . Mitral regurgitation    Mild echo  . Myocardial infarction   . Nasal drainage    Chronic  . Overweight(278.02)  November, 2012  . Pericardial effusion   . Pericardial effusion    November, 2011 .. decreased during hospitalization  . Pilonidal cyst   . Pleural effusion    Large loculated effusion on the left side November, 2011. This was tapped. It was exudative. Cytology revealed no cancer no proof of mesothelioma area pulmonary team felt that no further workup was needed  . Pleural thickening   . Pneumonia   . Polycythemia vera ([place]) 07/28/2014  . S/P AAA repair   . S/P ICD (internal cardiac defibrillator) procedure    Dr. Lovena Le 2009... by the pacing  . SOB (shortness of breath)    Large left effusion/ thoracentesis/hospitalization/November, 2011... exudated.. cytology negative.. Dr.Wert.. no proof of mesothelioma  .  Spinal cord stimulator status    October, 2013  . Spinal stenosis    Surgery Dr.Elsner  . Thrombophlebitis of superficial veins of upper extremities    Possible venous stenosis from defibrillator  . Venous insufficiency    Toe discoloration chronic  . Ventral hernia    April, 2014, result of his abdominal surgery  . Warfarin anticoagulation   . Wide-complex tachycardia ([place])     Past Surgical History:  Procedure Laterality Date  . ABDOMINAL AORTIC ANEURYSM REPAIR  11/2002  . BACK SURGERY    . BI-VENTRICULAR PACEMAKER INSERTION (CRT-P)  02-11-2013   Pt with previously implanted MDT CRTD downgraded to CRTP by Dr Lovena Le 02-11-13  . COLONOSCOPY W/ POLYPECTOMY    . CORONARY ARTERY BYPASS GRAFT  2004  . IMPLANTABLE CARDIOVERTER DEFIBRILLATOR (ICD) GENERATOR CHANGE N/A 02/11/2013   Procedure: ICD GENERATOR CHANGE;  Surgeon: Evans Lance, MD;  Location: [facility] CATH LAB;  Service: Cardiovascular;  Laterality: N/A;  . INCISION AND DRAINAGE ABSCESS / HEMATOMA OF BURSA / KNEE / THIGH    . LEG SURGERY Right   . LUMBAR FUSION    . pilonidal cyst removal    . SURGERY SCROTAL / TESTICULAR       Current Outpatient Prescriptions  Medication Sig Dispense Refill  . ACCU-CHEK AVIVA PLUS test strip USE ONE STRIP TO CHECK GLUCOSE THREE TIMES DAILY AS DIRECTED 100 each 3  . ACCU-CHEK FASTCLIX LANCETS MISC USE ONE  TO CHECK GLUCOSE THREE TIMES DAILY 102 each 5  . acetaminophen (TYLENOL) 325 MG tablet Take 325 mg by mouth every 4 (four) hours as needed for moderate pain.     Marland Kitchen allopurinol (ZYLOPRIM) 100 MG tablet Take 2 tablets (200 mg total) by mouth daily. 180 tablet 1  . amLODipine (NORVASC) 5 MG tablet Take 2.5 mg by mouth daily.    Marland Kitchen aspirin 81 MG tablet Take 81 mg by mouth once a week.     Marland Kitchen atorvastatin (LIPITOR) 10 MG tablet Take 1 tablet (10 mg total) by mouth daily. 90 tablet 3  . carboxymethylcellulose (REFRESH PLUS) 0.5 % SOLN Place 2 drops into both eyes daily.     . carvedilol (COREG) 6.25  MG tablet Take 1 tablet (6.25 mg total) by mouth 2 (two) times daily. 180 tablet 3  . chlorthalidone (HYGROTON) 25 MG tablet Take 1 tablet daily when weight reaches 255 lbs or greater. 90 tablet 3  . Cholecalciferol 10000 UNITS CAPS Take 1,000 Units by mouth daily.    . colchicine 0.6 MG tablet Take 0.6 mg by mouth as needed (gout).    . furosemide (LASIX) 80 MG tablet TAKE ONE TABLET BY MOUTH TWICE DAILY 180 tablet 3  . gabapentin (NEURONTIN) 300 MG capsule TAKE ONE CAPSULE  BY MOUTH 4 TIMES DAILY AS NEEDED FOR LEG PAIN 360 capsule 3  . insulin detemir (LEVEMIR) 100 UNIT/ML injection Inject 20 Units into the skin 2 (two) times daily.     . insulin regular (NOVOLIN R) 100 units/mL injection Inject 30 Units into the skin 2 (two) times daily before a meal.    . ipratropium (ATROVENT) 0.06 % nasal spray Place 1 spray into both nostrils daily.     Marland Kitchen losartan (COZAAR) 50 MG tablet TAKE ONE TABLET BY MOUTH ONCE DAILY 90 tablet 1  . methocarbamol (ROBAXIN) 750 MG tablet Take 1 tablet (750 mg total) by mouth 2 (two) times daily. 60 tablet 5  . naphazoline-pheniramine (NAPHCON-A) 0.025-0.3 % ophthalmic solution Place 1 drop into both eyes daily.     . nitroGLYCERIN (NITROSTAT) 0.4 MG SL tablet Place 1 tablet (0.4 mg total) under the tongue every 5 (five) minutes as needed for chest pain (MAX 3 TABLETS). 25 tablet 3  . omeprazole (PRILOSEC) 20 MG capsule Take 20 mg by mouth 2 (two) times daily before a meal.    . potassium chloride SA (K-DUR,KLOR-CON) 20 MEQ tablet Take 1.5 tablets (30 mEq total) by mouth 2 (two) times daily. TAKE 1.5 TWICE DAILY TO TOTAL 30 MEQ 270 tablet 3  . warfarin (COUMADIN) 2.5 MG tablet Take 1 tablet (2.5 mg total) by mouth as directed. 130 tablet 1   No current facility-administered medications for this visit.     Allergies:   Avelox [moxifloxacin hcl in nacl] and Penicillins    Social History:  The patient  reports that he quit smoking about 20 years ago. His smoking use  included Cigarettes. He started smoking about 59 years ago. He has a 117.00 pack-year smoking history. He has never used smokeless tobacco. He reports that he drinks about 1.2 oz of alcohol per week . He reports that he does not use drugs.   Family History:  The patient's family history includes Coronary artery disease in his father; Diabetes in his brother and father; Hypertension in his mother; Other in his father; Stroke in his mother.    ROS:  Please see the history of present illness.   Otherwise, review of systems are positive for leg swelling.   All other systems are reviewed and negative.    PHYSICAL EXAM: VS:  BP 100/60   Pulse 66   Ht 6' (1.829 m)   Wt 261 lb (118.4 kg)   BMI 35.40 kg/m  , BMI Body mass index is 35.4 kg/m. GEN: Well nourished, well developed, in no acute distress  HEENT: normal  Neck: no JVD, carotid bruits, or masses Cardiac: irregularly irregular; no murmurs, rubs, or gallops, chronic right leg swelling edema  Respiratory:  clear to auscultation bilaterally, normal work of breathing GI: soft, nontender, nondistended, + BS MS: no deformity or atrophy  Skin: warm and dry, no rash Neuro:  Strength and sensation are intact Psych: euthymic mood, full affect   EKG:   The ekg ordered today demonstrates atrial fibrillation, rate controlled   Recent Labs: 12/30/2015: ALT 12; BUN 37; Creatinine, Ser 1.60; HGB 15.5; Platelets 180; Potassium, Ser 3.7; Sodium 138   Lipid Panel    Component Value Date/Time   CHOL 143 10/12/2015 1400   TRIG 157.0 (H) 10/12/2015 1400   HDL 38.70 (L) 10/12/2015 1400   CHOLHDL 4 10/12/2015 1400   VLDL 31.4 10/12/2015 1400   LDLCALC 73 10/12/2015 1400     Other studies Reviewed: Additional studies/ records that  were reviewed today with results demonstrating: daily weight; > 24 about 30-40% of the time.   ASSESSMENT AND PLAN:  1. AFib: rate controlled. Pacer for bradycardia. Coumadin for stroke prevention. 2. CAD: s/p  CABG. He had distinct sx with his prior MI.  3. Chronic systolic heart failure: Appears euvolemic. Continue daily weights and management of volume status. He will continue to be in contact with Jeani Hawking.  Will change threshold for taking extra diuretic for weight > 255 lbs. 4. DM: Followed by PMD.  5. Polycythemia vera: Feels much better after his phlebotomy treatments. 6. I encouraged him to try to get access to a recumbent bike to help increase his stamina and ability to exercise.  He has chronic back pain that limits his exercise.    Current medicines are reviewed at length with the patient today.  The patient concerns regarding his medicines were addressed.  The following changes have been made:  No change  Labs/ tests ordered today include: BMet No orders of the defined types were placed in this encounter.   Recommend 150 minutes/week of aerobic exercise Low fat, low carb, high fiber diet recommended  Disposition:   FU in 5 months- he would like to be seen every 4 months.  He sees EP in a month   Signed, Larae Grooms, MD  01/01/2016 3:04 PM    [facility] [address], [place], [place]  [address] Phone: [phone]; Fax: [phone]

## 2016-01-01 ENCOUNTER — Ambulatory Visit (INDEPENDENT_AMBULATORY_CARE_PROVIDER_SITE_OTHER): Payer: Medicare Other | Admitting: Pharmacist

## 2016-01-01 ENCOUNTER — Ambulatory Visit (INDEPENDENT_AMBULATORY_CARE_PROVIDER_SITE_OTHER): Payer: Medicare Other | Admitting: Interventional Cardiology

## 2016-01-01 ENCOUNTER — Encounter: Payer: Self-pay | Admitting: Interventional Cardiology

## 2016-01-01 VITALS — BP 100/60 | HR 66 | Ht 72.0 in | Wt 261.0 lb

## 2016-01-01 DIAGNOSIS — Z7901 Long term (current) use of anticoagulants: Secondary | ICD-10-CM

## 2016-01-01 DIAGNOSIS — I2510 Atherosclerotic heart disease of native coronary artery without angina pectoris: Secondary | ICD-10-CM | POA: Diagnosis not present

## 2016-01-01 DIAGNOSIS — I5022 Chronic systolic (congestive) heart failure: Secondary | ICD-10-CM | POA: Diagnosis not present

## 2016-01-01 DIAGNOSIS — I482 Chronic atrial fibrillation, unspecified: Secondary | ICD-10-CM

## 2016-01-01 DIAGNOSIS — Z794 Long term (current) use of insulin: Secondary | ICD-10-CM

## 2016-01-01 DIAGNOSIS — I4891 Unspecified atrial fibrillation: Secondary | ICD-10-CM | POA: Diagnosis not present

## 2016-01-01 DIAGNOSIS — E1159 Type 2 diabetes mellitus with other circulatory complications: Secondary | ICD-10-CM | POA: Diagnosis not present

## 2016-01-01 LAB — POCT INR: INR: 2.3

## 2016-01-01 NOTE — Patient Instructions (Signed)
**Note De-Identified Countess Biebel Obfuscation** Medication Instructions:  Same-no changes  Labwork: None  Testing/Procedures: None  Follow-Up: Your physician wants you to follow-up in: 5 months. You will receive a reminder letter in the mail two months in advance. If you don't receive a letter, please call our office to schedule the follow-up appointment.     If you need a refill on your cardiac medications before your next appointment, please call your pharmacy.

## 2016-01-07 ENCOUNTER — Ambulatory Visit (INDEPENDENT_AMBULATORY_CARE_PROVIDER_SITE_OTHER): Payer: Medicare Other | Admitting: Emergency Medicine

## 2016-01-07 DIAGNOSIS — Z23 Encounter for immunization: Secondary | ICD-10-CM | POA: Diagnosis not present

## 2016-01-08 ENCOUNTER — Telehealth: Payer: Self-pay | Admitting: Interventional Cardiology

## 2016-01-10 ENCOUNTER — Other Ambulatory Visit: Payer: Self-pay | Admitting: Interventional Cardiology

## 2016-01-11 ENCOUNTER — Other Ambulatory Visit (INDEPENDENT_AMBULATORY_CARE_PROVIDER_SITE_OTHER): Payer: Medicare Other

## 2016-01-11 DIAGNOSIS — Z794 Long term (current) use of insulin: Secondary | ICD-10-CM | POA: Diagnosis not present

## 2016-01-11 DIAGNOSIS — E1165 Type 2 diabetes mellitus with hyperglycemia: Secondary | ICD-10-CM | POA: Diagnosis not present

## 2016-01-11 LAB — HEMOGLOBIN A1C: Hgb A1c MFr Bld: 6.6 % — ABNORMAL HIGH (ref 4.6–6.5)

## 2016-01-11 LAB — BASIC METABOLIC PANEL
BUN: 35 mg/dL — AB (ref 6–23)
CALCIUM: 9.1 mg/dL (ref 8.4–10.5)
CO2: 29 mEq/L (ref 19–32)
CREATININE: 1.82 mg/dL — AB (ref 0.40–1.50)
Chloride: 103 mEq/L (ref 96–112)
GFR: 38.82 mL/min — AB (ref >60.00)
GLUCOSE: 118 mg/dL — AB (ref 70–99)
Potassium: 3.7 mEq/L (ref 3.5–5.1)
Sodium: 140 mEq/L (ref 135–145)

## 2016-01-13 NOTE — Telephone Encounter (Signed)
NEw Message  Pt call requesting to speak with RN. Pt wife did not disclose any further information. Please call back to discuss

## 2016-01-13 NOTE — Telephone Encounter (Signed)
The pts wife called to report the pts weekly weights.

## 2016-01-14 ENCOUNTER — Ambulatory Visit (INDEPENDENT_AMBULATORY_CARE_PROVIDER_SITE_OTHER): Payer: Medicare Other | Admitting: Endocrinology

## 2016-01-14 ENCOUNTER — Encounter: Payer: Self-pay | Admitting: Endocrinology

## 2016-01-14 VITALS — BP 120/70 | HR 72 | Ht 72.0 in | Wt 261.0 lb

## 2016-01-14 DIAGNOSIS — E1142 Type 2 diabetes mellitus with diabetic polyneuropathy: Secondary | ICD-10-CM | POA: Diagnosis not present

## 2016-01-14 DIAGNOSIS — Z794 Long term (current) use of insulin: Secondary | ICD-10-CM | POA: Diagnosis not present

## 2016-01-14 DIAGNOSIS — E1165 Type 2 diabetes mellitus with hyperglycemia: Secondary | ICD-10-CM

## 2016-01-14 NOTE — Patient Instructions (Addendum)
R insulin 24 at supper  Check every 2 days in am  Take 2 Gabapentin at bedtime and 1 at supper

## 2016-01-14 NOTE — Progress Notes (Signed)
Patient ID: [name], male   DOB: 1940-06-13, 75 y.o.   MRN: [id number]    Reason for Appointment : F/u for Type 2 Diabetes  History of Present Illness          Diagnosis: Type 2 diabetes mellitus, date of diagnosis: 2008       Past history: He was initially treated with Metformin and also given Amayl at some point. With this he had fair control of his diabetes with A1c had usually been over 7% except once in 2013.  He was taken off metformin probably in March this year after a hospitalization, possibly because of renal dysfunction He was started on Levemir insulin in May when his blood sugars were significantly higher, fasting readings averaging 220.  Levemir progressively increased and was on 35 units at night since 12/20/12 He has not been on any other medications either orally or injectable for his diabetes Because of his A1c of over 10% in 10/14 on basal insulin alone he was started on NovoLog with each meal in 10/14  Recent history:     INSULIN regimen is described as: Levemir 20 units twice a day, Regular Insulin 30 units twice a day  He is back on Levemir now since he could not afford Toujeo Also now on Regular Insulin instead of Humalog also because of high cost  A1c is now better than usual at 6.6    Current management, blood sugar patterns and problems identified:   His fasting blood sugars are fairly good with blood sugars just over 100 usually.  There also fairly consistent.  He has fair control of her sugars after lunch with some fluctuation but generally within target, highest reading was over 200 with going off his diet last Sunday  Not clear why is requiring a much larger doses of regular insulin compared to Humalog 40s meals.  He thinks he is eating a healthy meal in the evening but always eating out at lunch  He did feel hypoglycemic yesterday 30 minutes after he took his insulin and was just starting to eat.  Only once has had a low  reading  of 63) before suppertime last month  Again not able to do much physical activity because of his significant back pain issues  He is very compliant with taking his insulin as directed 30 minutes before meals  Compliance with the medical regimen: Good   Glucose monitoring:  done  Up to 3  times a day        Glucometer:  Accu-Chek    Blood Glucose readings from download as follows  Mean values apply above for all meters except median for One Touch  PRE-MEAL Fasting PC Lunch PC Dinner Bedtime Overall  Glucose range: 88-148  121-269  80-162     Mean/median: 121  158  126   130    Self-care: The diet that the patient has been following is: Smaller portions and relatively balanced meals  Meals: 2 meals per day. Noon and 6 pm  Low fat meals At dinnertime. Avoiding drinks with sugar and no juices        Physical activity: exercise: Minimal, limited by back pain           Dietician visit: Most recent: Unknown.  CDE visit: 11/14                  Weight control:    Wt Readings from Last 3 Encounters:  01/14/16 261 lb (118.4 kg)  01/01/16 261 lb (118.4 kg)  12/30/15 263 lb (119.3 kg)    Lab Results  Component Value Date   HGBA1C 6.6 (H) 01/11/2016   HGBA1C 6.9 (H) 10/12/2015   HGBA1C 7.9 (H) 06/09/2015   Lab Results  Component Value Date   MICROALBUR 1.2 10/12/2015   LDLCALC 73 10/12/2015   CREATININE 1.82 (H) 01/11/2016       Medication List       Accurate as of 01/14/16  5:26 PM. Always use your most recent med list.          ACCU-CHEK AVIVA PLUS test strip Generic drug:  glucose blood USE ONE STRIP TO CHECK GLUCOSE THREE TIMES DAILY AS DIRECTED   ACCU-CHEK FASTCLIX LANCETS Misc USE ONE  TO CHECK GLUCOSE THREE TIMES DAILY   acetaminophen 325 MG tablet Commonly known as:  TYLENOL Take 325 mg by mouth every 4 (four) hours as needed for moderate pain.   allopurinol 100 MG tablet Commonly known as:  ZYLOPRIM Take 2 tablets (200 mg total) by mouth daily.     amLODipine 5 MG tablet Commonly known as:  NORVASC Take 2.5 mg by mouth daily.   aspirin 81 MG tablet Take 81 mg by mouth once a week.   atorvastatin 10 MG tablet Commonly known as:  LIPITOR Take 1 tablet (10 mg total) by mouth daily.   carboxymethylcellulose 0.5 % Soln Commonly known as:  REFRESH PLUS Place 2 drops into both eyes daily.   carvedilol 6.25 MG tablet Commonly known as:  COREG Take 1 tablet (6.25 mg total) by mouth 2 (two) times daily.   chlorthalidone 25 MG tablet Commonly known as:  HYGROTON Take 1 tablet daily when weight reaches 255 lbs or greater.   Cholecalciferol 10000 units Caps Take 1,000 Units by mouth daily.   colchicine 0.6 MG tablet Take 0.6 mg by mouth as needed (gout).   furosemide 80 MG tablet Commonly known as:  LASIX TAKE ONE TABLET BY MOUTH TWICE DAILY   gabapentin 300 MG capsule Commonly known as:  NEURONTIN TAKE ONE CAPSULE BY MOUTH 4 TIMES DAILY AS NEEDED FOR LEG PAIN   ipratropium 0.06 % nasal spray Commonly known as:  ATROVENT Place 1 spray into both nostrils daily.   LEVEMIR 100 UNIT/ML injection Generic drug:  insulin detemir Inject 20 Units into the skin 2 (two) times daily.   losartan 50 MG tablet Commonly known as:  COZAAR TAKE ONE TABLET BY MOUTH ONCE DAILY   methocarbamol 750 MG tablet Commonly known as:  ROBAXIN Take 1 tablet (750 mg total) by mouth 2 (two) times daily.   naphazoline-pheniramine 0.025-0.3 % ophthalmic solution Commonly known as:  NAPHCON-A Place 1 drop into both eyes daily.   nitroGLYCERIN 0.4 MG SL tablet Commonly known as:  NITROSTAT Place 1 tablet (0.4 mg total) under the tongue every 5 (five) minutes as needed for chest pain (MAX 3 TABLETS).   NOVOLIN R 100 units/mL injection Generic drug:  insulin regular Inject 30 Units into the skin 2 (two) times daily before a meal.   omeprazole 20 MG capsule Commonly known as:  PRILOSEC Take 20 mg by mouth 2 (two) times daily before a meal.    potassium chloride SA 20 MEQ tablet Commonly known as:  K-DUR,KLOR-CON Take 1.5 tablets (30 mEq total) by mouth 2 (two) times daily. TAKE 1.5 TWICE DAILY TO TOTAL 30 MEQ   warfarin 2.5 MG tablet Commonly known as:  COUMADIN TAKE ONE TABLET BY MOUTH AS DIRECTED  Allergies:  Allergies  Allergen Reactions  . Avelox [Moxifloxacin Hcl In Nacl] Swelling, Rash and Other (See Comments)    Patient became hypotensive after infusion started Because of a history of documented adverse serious drug reaction;Medi Alert bracelet  is recommended  . Penicillins Anaphylaxis, Other (See Comments) and Swelling    REACTION: anaphylaxis Because of a history of documented adverse serious drug reaction;Medi Alert bracelet  is recommended    Past Medical History:  Diagnosis Date  . AAA (abdominal aortic aneurysm) [facility])    Surgical repair  . Adenomatous colon polyp   . Alcohol ingestion of more than four drinks per week    Excess beer  not a dependency problem  . Aortic valve sclerosis   . Arthritis   . Ascending aortic aneurysm ([place]) 05/10/2014  . Atrial fibrillation ([place])    Previous long-term amiodarone therapy with multiple cardioversions / amiodarone stopped September, 2009  . Atrial flutter [facility])    Started November, 2010, Left-sided and cannot ablate  . Bony abnormality    Patient's manubrium is slightly displaced to the right  . CAD (coronary artery disease)    Catheterization July, 2008... name and vein grafts patent but low cardiac output  . Cardiomyopathy    Ischemic... ICD  . Carotid artery disease ([place])    Doppler, December, 2013, 0-39% bilateral  . Chronotropic incompetence    IV pacing rate adjusted  . Combined systolic and diastolic CHF   . COPD (chronic obstructive pulmonary disease) ([place])   . Diabetes mellitus   . Discolored skin   . Diverticulosis   . Drug therapy    Redness and swelling with Avelox infusion May 24, 2011  . Ejection fraction    Ejection fraction  has varied over time from 35-50%.,, Echoes are technically very difficult,,, EF 50%, echo, May 25, 2011, technically very difficult  . Eye abnormality    Ophthalmologist questions a clot in one of his eyes, May, 2012  . Gout   . Hyperlipidemia   . Hypertension   . Internal hemorrhoids   . Left atrial thrombus    Remote past... cardioversions done since that time  . Left ventricular ejection fraction less than 40%   . Mitral regurgitation    Mild echo  . Myocardial infarction   . Nasal drainage    Chronic  . Overweight(278.02)    November, 2012  . Pericardial effusion   . Pericardial effusion    November, 2011 .. decreased during hospitalization  . Pilonidal cyst   . Pleural effusion    Large loculated effusion on the left side November, 2011. This was tapped. It was exudative. Cytology revealed no cancer no proof of mesothelioma area pulmonary team felt that no further workup was needed  . Pleural thickening   . Pneumonia   . Polycythemia vera ([place]) 07/28/2014  . S/P AAA repair   . S/P ICD (internal cardiac defibrillator) procedure    Dr. Lovena Le 2009... by the pacing  . SOB (shortness of breath)    Large left effusion/ thoracentesis/hospitalization/November, 2011... exudated.. cytology negative.. Dr.Wert.. no proof of mesothelioma  . Spinal cord stimulator status    October, 2013  . Spinal stenosis    Surgery Dr.Elsner  . Thrombophlebitis of superficial veins of upper extremities    Possible venous stenosis from defibrillator  . Venous insufficiency    Toe discoloration chronic  . Ventral hernia    April, 2014, result of his abdominal surgery  . Warfarin anticoagulation   . Wide-complex  tachycardia [facility])     Past Surgical History:  Procedure Laterality Date  . ABDOMINAL AORTIC ANEURYSM REPAIR  11/2002  . BACK SURGERY    . BI-VENTRICULAR PACEMAKER INSERTION (CRT-P)  02-11-2013   Pt with previously implanted MDT CRTD downgraded to CRTP by Dr Lovena Le 02-11-13  .  COLONOSCOPY W/ POLYPECTOMY    . CORONARY ARTERY BYPASS GRAFT  2004  . IMPLANTABLE CARDIOVERTER DEFIBRILLATOR (ICD) GENERATOR CHANGE N/A 02/11/2013   Procedure: ICD GENERATOR CHANGE;  Surgeon: Evans Lance, MD;  Location: [facility] CATH LAB;  Service: Cardiovascular;  Laterality: N/A;  . INCISION AND DRAINAGE ABSCESS / HEMATOMA OF BURSA / KNEE / THIGH    . LEG SURGERY Right   . LUMBAR FUSION    . pilonidal cyst removal    . SURGERY SCROTAL / TESTICULAR      Family History  Problem Relation Age of Onset  . Hypertension Mother   . Stroke Mother   . Diabetes Father   . Coronary artery disease Father   . Other Father     DVT  . Diabetes Brother   . Colon cancer Neg Hx   . Heart attack Neg Hx     Social History:  reports that he quit smoking about 20 years ago. His smoking use included Cigarettes. He started smoking about 59 years ago. He has a 117.00 pack-year smoking history. He has never used smokeless tobacco. He reports that he drinks about 1.2 oz of alcohol per week . He reports that he does not use drugs.    Review of Systems       Lipids: Treated with Lipitor  only taking 10 mg, Followed by PCP and cardiologist  LDL is under 100 but not under 70  and also has low HDL   Lab Results  Component Value Date   CHOL 143 10/12/2015   HDL 38.70 (L) 10/12/2015   LDLCALC 73 10/12/2015   TRIG 157.0 (H) 10/12/2015   CHOLHDL 4 10/12/2015       The blood pressure has been under control with current regimen of chlorthalidone, amlodipine and losartan Also he takes extra chlorthalidone when he has weight gain  Renal dysfunction: Creatinine has been Generally stable, has been persistently high  potassium recently normal.   Lab Results  Component Value Date   CREATININE 1.82 (H) 01/11/2016   BUN 35 (H) 01/11/2016   NA 140 01/11/2016   K 3.7 01/11/2016   CL 103 01/11/2016   CO2 29 01/11/2016      Has known neuropathy: He has had pains and tingling in his feet and lower legs.    Mostly having  Less symptoms in day;  Now he says that he is having much more pain in the mornings in the lower legs when he wakes up Takes gabapentin for this and 2 tablets at suppertime but no tablet at bedtime    Physical Examination:  BP 120/70   Pulse 72   Ht 6' (1.829 m)   Wt 261 lb (118.4 kg)   SpO2 96%   BMI 35.40 kg/m      ASSESSMENT/PLAN:  Diabetes type 2 See history of present illness for detailed discussion of his current management, blood sugar patterns and problems identified    He has excellent control with A1c 6.6 and home readings are overall fairly well controlled  He has taken generally larger doses of mealtime coverage especially when he goes out to eat at lunch. Since he has low normal readings after supper  and this is a relatively lighter meal he can reduce the suppertime dose to 24 Continue same Levemir as fasting readings are excellent He can check readings less often in the morning Discussed timing of insulin, dosage adjustment based on meal size, blood sugar targets, frequency of monitoring He will try to use lower fat meals at lunch He will call if he is having relatively lower readings after lunch  NEUROPATHY: Discussed that we can add a narcotic pain medication for his severe neuropathy pain that he has in the mornings but he would prefer to work with gabapentin and not start Percocet. He can add  Gabapentin 2 capsules at bedtime and reduce the suppertime dose to 1 capsule. May not need as much in the morning also Unlikely that he will want to pay for brand name Lyrica currently   Patient Instructions  R insulin 24 at supper  Check every 2 days in am  Take 2 Gabapentin at bedtime and 1 at supper  Counseling time on subjects discussed above is over 50% of today's 25 minute visit   Daniah Zaldivar 01/14/2016, 5:26 PM

## 2016-02-03 ENCOUNTER — Encounter: Payer: Self-pay | Admitting: Internal Medicine

## 2016-02-03 ENCOUNTER — Ambulatory Visit (INDEPENDENT_AMBULATORY_CARE_PROVIDER_SITE_OTHER): Payer: Medicare Other | Admitting: Internal Medicine

## 2016-02-03 VITALS — BP 92/56 | HR 68 | Ht 72.0 in | Wt 265.0 lb

## 2016-02-03 DIAGNOSIS — Z95810 Presence of automatic (implantable) cardiac defibrillator: Secondary | ICD-10-CM | POA: Diagnosis not present

## 2016-02-03 DIAGNOSIS — I482 Chronic atrial fibrillation, unspecified: Secondary | ICD-10-CM

## 2016-02-03 LAB — CUP PACEART INCLINIC DEVICE CHECK
Battery Voltage: 2.97 V
Brady Statistic AP VP Percent: 0 %
Brady Statistic AP VS Percent: 0 %
Brady Statistic AS VP Percent: 12.43 %
Brady Statistic AS VS Percent: 87.57 %
Date Time Interrogation Session: 20171115161542
HighPow Impedance: 73 Ohm
Implantable Lead Implant Date: 20090121
Implantable Lead Implant Date: 20090121
Implantable Lead Location: 753859
Implantable Lead Model: 4193
Implantable Lead Model: 6935
Lead Channel Impedance Value: 4047 Ohm
Lead Channel Impedance Value: 437 Ohm
Lead Channel Impedance Value: 456 Ohm
Lead Channel Pacing Threshold Pulse Width: 0.4 ms
Lead Channel Sensing Intrinsic Amplitude: 0.875 mV
Lead Channel Sensing Intrinsic Amplitude: 11.125 mV
Lead Channel Setting Pacing Amplitude: 2.5 V
Lead Channel Setting Pacing Pulse Width: 0.4 ms
MDC IDC LEAD IMPLANT DT: 20090121
MDC IDC LEAD LOCATION: 753858
MDC IDC LEAD LOCATION: 753860
MDC IDC MSMT BATTERY REMAINING LONGEVITY: 59 mo
MDC IDC MSMT LEADCHNL LV IMPEDANCE VALUE: 4047 Ohm
MDC IDC MSMT LEADCHNL RA SENSING INTR AMPL: 1 mV
MDC IDC MSMT LEADCHNL RV IMPEDANCE VALUE: 380 Ohm
MDC IDC MSMT LEADCHNL RV IMPEDANCE VALUE: 437 Ohm
MDC IDC MSMT LEADCHNL RV PACING THRESHOLD AMPLITUDE: 0.75 V
MDC IDC MSMT LEADCHNL RV SENSING INTR AMPL: 12.625 mV
MDC IDC PG IMPLANT DT: 20141124
MDC IDC SET LEADCHNL RV SENSING SENSITIVITY: 0.3 mV
MDC IDC STAT BRADY RA PERCENT PACED: 0 %
MDC IDC STAT BRADY RV PERCENT PACED: 16.57 %

## 2016-02-03 NOTE — Progress Notes (Signed)
HPI [name] returns today for ongoing followup of his ICD and chronic systolic heart failure. He has polycythemia and his heart failure has improved with the initiation of phlebotomy. He has a h/o chronic atrial fibrillation. He has tried to reduce his salt intake with limited success. No ICD shocks. He has been limited mostly by pain in his back.  Allergies  Allergen Reactions  . Avelox [Moxifloxacin Hcl In Nacl] Swelling, Rash and Other (See Comments)    Patient became hypotensive after infusion started Because of a history of documented adverse serious drug reaction;Medi Alert bracelet  is recommended  . Penicillins Anaphylaxis, Other (See Comments) and Swelling    REACTION: anaphylaxis Because of a history of documented adverse serious drug reaction;Medi Alert bracelet  is recommended     Current Outpatient Prescriptions  Medication Sig Dispense Refill  . ACCU-CHEK AVIVA PLUS test strip USE ONE STRIP TO CHECK GLUCOSE THREE TIMES DAILY AS DIRECTED 100 each 3  . ACCU-CHEK FASTCLIX LANCETS MISC USE ONE  TO CHECK GLUCOSE THREE TIMES DAILY 102 each 5  . acetaminophen (TYLENOL) 325 MG tablet Take 325 mg by mouth every 4 (four) hours as needed for moderate pain.     Marland Kitchen allopurinol (ZYLOPRIM) 100 MG tablet Take 2 tablets (200 mg total) by mouth daily. 180 tablet 1  . amLODipine (NORVASC) 5 MG tablet Take 2.5 mg by mouth daily.    Marland Kitchen aspirin 81 MG tablet Take 81 mg by mouth once a week.     Marland Kitchen atorvastatin (LIPITOR) 10 MG tablet Take 1 tablet (10 mg total) by mouth daily. 90 tablet 3  . carboxymethylcellulose (REFRESH PLUS) 0.5 % SOLN Place 2 drops into both eyes daily.     . carvedilol (COREG) 6.25 MG tablet Take 1 tablet (6.25 mg total) by mouth 2 (two) times daily. 180 tablet 3  . chlorthalidone (HYGROTON) 25 MG tablet Take 1 tablet by mouth daily when weight reaches 255 lbs or greater.    . Cholecalciferol 10000 UNITS CAPS Take 1,000 Units by mouth daily.    . colchicine 0.6 MG  tablet Take 0.6 mg by mouth daily as needed (gout).     . furosemide (LASIX) 80 MG tablet TAKE ONE TABLET BY MOUTH TWICE DAILY 180 tablet 3  . gabapentin (NEURONTIN) 300 MG capsule TAKE ONE CAPSULE BY MOUTH 4 TIMES DAILY AS NEEDED FOR LEG PAIN 360 capsule 3  . insulin detemir (LEVEMIR) 100 UNIT/ML injection Inject 20 Units into the skin 2 (two) times daily.     . insulin regular (NOVOLIN R) 100 units/mL injection Inject 30 Units into the skin 2 (two) times daily before a meal.    . ipratropium (ATROVENT) 0.06 % nasal spray Place 1 spray into both nostrils daily.     Marland Kitchen losartan (COZAAR) 50 MG tablet TAKE ONE TABLET BY MOUTH ONCE DAILY 90 tablet 1  . methocarbamol (ROBAXIN) 750 MG tablet Take 750 mg by mouth 2 (two) times daily as needed for muscle spasms.    . naphazoline-pheniramine (NAPHCON-A) 0.025-0.3 % ophthalmic solution Place 1 drop into both eyes daily.     . nitroGLYCERIN (NITROSTAT) 0.4 MG SL tablet Place 1 tablet (0.4 mg total) under the tongue every 5 (five) minutes as needed for chest pain (MAX 3 TABLETS). 25 tablet 3  . omeprazole (PRILOSEC) 20 MG capsule Take 20 mg by mouth 2 (two) times daily before a meal.    . potassium chloride SA (K-DUR,KLOR-CON) 20 MEQ tablet Take  1.5 tablets (30 mEq total) by mouth 2 (two) times daily. TAKE 1.5 TWICE DAILY TO TOTAL 30 MEQ 270 tablet 3  . warfarin (COUMADIN) 2.5 MG tablet TAKE ONE TABLET BY MOUTH AS DIRECTED 120 tablet 1   No current facility-administered medications for this visit.      Past Medical History:  Diagnosis Date  . AAA (abdominal aortic aneurysm) [facility])    Surgical repair  . Adenomatous colon polyp   . Alcohol ingestion of more than four drinks per week    Excess beer  not a dependency problem  . Aortic valve sclerosis   . Arthritis   . Ascending aortic aneurysm ([place]) 05/10/2014  . Atrial fibrillation ([place])    Previous long-term amiodarone therapy with multiple cardioversions / amiodarone stopped September, 2009  . Atrial  flutter [facility])    Started November, 2010, Left-sided and cannot ablate  . Bony abnormality    Patient's manubrium is slightly displaced to the right  . CAD (coronary artery disease)    Catheterization July, 2008... name and vein grafts patent but low cardiac output  . Cardiomyopathy    Ischemic... ICD  . Carotid artery disease ([place])    Doppler, December, 2013, 0-39% bilateral  . Chronotropic incompetence    IV pacing rate adjusted  . Combined systolic and diastolic CHF   . COPD (chronic obstructive pulmonary disease) ([place])   . Diabetes mellitus   . Discolored skin   . Diverticulosis   . Drug therapy    Redness and swelling with Avelox infusion May 24, 2011  . Ejection fraction    Ejection fraction has varied over time from 35-50%.,, Echoes are technically very difficult,,, EF 50%, echo, May 25, 2011, technically very difficult  . Eye abnormality    Ophthalmologist questions a clot in one of his eyes, May, 2012  . Gout   . Hyperlipidemia   . Hypertension   . Internal hemorrhoids   . Left atrial thrombus    Remote past... cardioversions done since that time  . Left ventricular ejection fraction less than 40%   . Mitral regurgitation    Mild echo  . Myocardial infarction   . Nasal drainage    Chronic  . Overweight(278.02)    November, 2012  . Pericardial effusion   . Pericardial effusion    November, 2011 .. decreased during hospitalization  . Pilonidal cyst   . Pleural effusion    Large loculated effusion on the left side November, 2011. This was tapped. It was exudative. Cytology revealed no cancer no proof of mesothelioma area pulmonary team felt that no further workup was needed  . Pleural thickening   . Pneumonia   . Polycythemia vera ([place]) 07/28/2014  . S/P AAA repair   . S/P ICD (internal cardiac defibrillator) procedure    Dr. Lovena Le 2009... by the pacing  . SOB (shortness of breath)    Large left effusion/ thoracentesis/hospitalization/November, 2011...  exudated.. cytology negative.. Dr.Wert.. no proof of mesothelioma  . Spinal cord stimulator status    October, 2013  . Spinal stenosis    Surgery Dr.Elsner  . Thrombophlebitis of superficial veins of upper extremities    Possible venous stenosis from defibrillator  . Venous insufficiency    Toe discoloration chronic  . Ventral hernia    April, 2014, result of his abdominal surgery  . Warfarin anticoagulation   . Wide-complex tachycardia ([place])     ROS:   All systems reviewed and negative except as noted in the HPI.  Past Surgical History:  Procedure Laterality Date  . ABDOMINAL AORTIC ANEURYSM REPAIR  11/2002  . BACK SURGERY    . BI-VENTRICULAR PACEMAKER INSERTION (CRT-P)  02-11-2013   Pt with previously implanted MDT CRTD downgraded to CRTP by Dr Lovena Le 02-11-13  . COLONOSCOPY W/ POLYPECTOMY    . CORONARY ARTERY BYPASS GRAFT  2004  . IMPLANTABLE CARDIOVERTER DEFIBRILLATOR (ICD) GENERATOR CHANGE N/A 02/11/2013   Procedure: ICD GENERATOR CHANGE;  Surgeon: Evans Lance, MD;  Location: [facility] CATH LAB;  Service: Cardiovascular;  Laterality: N/A;  . INCISION AND DRAINAGE ABSCESS / HEMATOMA OF BURSA / KNEE / THIGH    . LEG SURGERY Right   . LUMBAR FUSION    . pilonidal cyst removal    . SURGERY SCROTAL / TESTICULAR       Family History  Problem Relation Age of Onset  . Hypertension Mother   . Stroke Mother   . Diabetes Father   . Coronary artery disease Father   . Other Father     DVT  . Diabetes Brother   . Colon cancer Neg Hx   . Heart attack Neg Hx      Social History   Social History  . Marital status: Married    Spouse name: N/A  . Number of children: N/A  . Years of education: N/A   Occupational History  . Retired- Nurse, mental health    Social History Main Topics  . Smoking status: Former Smoker    Packs/day: 3.00    Years: 39.00    Types: Cigarettes    Start date: 05/24/1956    Quit date: 03/22/1995  . Smokeless tobacco: Never Used     Comment:  quit smoking 18 years ago  . Alcohol use 1.2 oz/week    2 Cans of beer per week     Comment: beer  . Drug use: No  . Sexual activity: No   Other Topics Concern  . Not on file   Social History Narrative  . No narrative on file     BP (!) 92/56   Pulse 68   Ht 6' (1.829 m)   Wt 265 lb (120.2 kg)   BMI 35.94 kg/m   Physical Exam:  obese appearing 75 yo man, NAD HEENT: Unremarkable Neck:  7 cm JVD, no thyromegally Lymphatics:  No adenopathy Back:  No CVA tenderness Lungs:  Clear with no wheezes HEART:  Regular rate rhythm, no murmurs, no rubs, no clicks Abd:  soft, positive bowel sounds, no organomegally, no rebound, no guarding Ext:  2 plus pulses, no edema, no cyanosis, no clubbing Skin:  No rashes no nodules Neuro:  CN II through XII intact, motor grossly intact   DEVICE  Normal device function.  See PaceArt for details.   Assess/Plan: 1. Chronic systolic heart failure - his symptoms are class 2. He is encouraged to reduce his salt intake and fluid intake. I have asked him to increase his activity. 2. Atrial fib - his ventricular rate is controlled. He will continue his systemic anti-coagulation.  3. ICD - his device is working normally. Will recheck in several months. 4. HTN - his blood pressure is now better with his meds and phlebotomy.  Mikle Bosworth.D.

## 2016-02-03 NOTE — Patient Instructions (Addendum)
Medication Instructions:  Your physician recommends that you continue on your current medications as directed. Please refer to the Current Medication list given to you today.   Labwork: None Ordered   Testing/Procedures: None Ordered   Follow-Up: Your physician wants you to follow-up in: 1 year with Dr. Taylor.  You will receive a reminder letter in the mail two months in advance. If you don't receive a letter, please call our office to schedule the follow-up appointment.  Remote monitoring is used to monitor your ICD from home. This monitoring reduces the number of office visits required to check your device to one time per year. It allows us to keep an eye on the functioning of your device to ensure it is working properly. You are scheduled for a device check from home on 05/04/16. You may send your transmission at any time that day. If you have a wireless device, the transmission will be sent automatically. After your physician reviews your transmission, you will receive a postcard with your next transmission date.    Any Other Special Instructions Will Be Listed Below (If Applicable).     If you need a refill on your cardiac medications before your next appointment, please call your pharmacy.   

## 2016-02-04 ENCOUNTER — Other Ambulatory Visit: Payer: Self-pay | Admitting: Interventional Cardiology

## 2016-02-05 ENCOUNTER — Telehealth: Payer: Self-pay | Admitting: Interventional Cardiology

## 2016-02-05 NOTE — Telephone Encounter (Signed)
New message   Pt wife verbalized that she is returning call for pt

## 2016-02-05 NOTE — Telephone Encounter (Signed)
**Note De-Identified Shawntelle Ungar Obfuscation** The pts wife called to report the pts weekly weights. Noted.

## 2016-02-09 DIAGNOSIS — Z0100 Encounter for examination of eyes and vision without abnormal findings: Secondary | ICD-10-CM | POA: Diagnosis not present

## 2016-02-09 DIAGNOSIS — H353121 Nonexudative age-related macular degeneration, left eye, early dry stage: Secondary | ICD-10-CM | POA: Diagnosis not present

## 2016-02-09 DIAGNOSIS — H353111 Nonexudative age-related macular degeneration, right eye, early dry stage: Secondary | ICD-10-CM | POA: Diagnosis not present

## 2016-02-09 DIAGNOSIS — Z961 Presence of intraocular lens: Secondary | ICD-10-CM | POA: Diagnosis not present

## 2016-02-15 ENCOUNTER — Ambulatory Visit (INDEPENDENT_AMBULATORY_CARE_PROVIDER_SITE_OTHER): Payer: Medicare Other | Admitting: *Deleted

## 2016-02-15 DIAGNOSIS — I4891 Unspecified atrial fibrillation: Secondary | ICD-10-CM

## 2016-02-15 DIAGNOSIS — Z7901 Long term (current) use of anticoagulants: Secondary | ICD-10-CM

## 2016-02-15 LAB — POCT INR: INR: 2.1

## 2016-02-16 ENCOUNTER — Telehealth: Payer: Self-pay | Admitting: Interventional Cardiology

## 2016-02-16 NOTE — Telephone Encounter (Signed)
The pts wife called to report the pts weekly weights. Noted.

## 2016-02-16 NOTE — Telephone Encounter (Signed)
F/u Message  Pt wife states she is returning RN call. Please call back to discuss

## 2016-02-23 ENCOUNTER — Encounter: Payer: Self-pay | Admitting: Nurse Practitioner

## 2016-02-23 ENCOUNTER — Ambulatory Visit (INDEPENDENT_AMBULATORY_CARE_PROVIDER_SITE_OTHER): Payer: Medicare Other | Admitting: Nurse Practitioner

## 2016-02-23 ENCOUNTER — Encounter: Payer: Self-pay | Admitting: Internal Medicine

## 2016-02-23 ENCOUNTER — Ambulatory Visit (INDEPENDENT_AMBULATORY_CARE_PROVIDER_SITE_OTHER)
Admission: RE | Admit: 2016-02-23 | Discharge: 2016-02-23 | Disposition: A | Discharge status: Home / Self Care | Payer: Medicare Other | Source: Ambulatory Visit | Attending: Nurse Practitioner | Admitting: Nurse Practitioner

## 2016-02-23 VITALS — BP 108/68 | HR 86 | Temp 97.4°F

## 2016-02-23 DIAGNOSIS — J209 Acute bronchitis, unspecified: Secondary | ICD-10-CM

## 2016-02-23 DIAGNOSIS — R05 Cough: Secondary | ICD-10-CM | POA: Diagnosis not present

## 2016-02-23 DIAGNOSIS — R0989 Other specified symptoms and signs involving the circulatory and respiratory systems: Secondary | ICD-10-CM

## 2016-02-23 DIAGNOSIS — R0602 Shortness of breath: Secondary | ICD-10-CM | POA: Diagnosis not present

## 2016-02-23 MED ORDER — DOXYCYCLINE HYCLATE 100 MG PO TABS: 100.0000 mg | ORAL_TABLET | Freq: Two times a day (BID) | ORAL | 0 refills | Status: AC

## 2016-02-23 MED ORDER — HYDROCODONE-HOMATROPINE 5-1.5 MG/5ML PO SYRP: 5.0000 mL | ORAL_SOLUTION | Freq: Every evening | ORAL | 0 refills | Status: DC | PRN

## 2016-02-23 MED ORDER — GUAIFENESIN ER 600 MG PO TB12: 600.0000 mg | ORAL_TABLET | Freq: Two times a day (BID) | ORAL | 0 refills | Status: DC | PRN

## 2016-02-23 NOTE — Patient Instructions (Addendum)
No pneumonia, no pulmonary edema, no acute finding on today's CXR.  URI Instructions: Encourage adequate oral hydration.  Use over-the-counter  "cold" medicines  such as "Tylenol cold" , "Advil cold",  "Mucinex" or" Mucinex D"  for cough and congestion.  Avoid decongestants if you have high blood pressure. Use" Delsym" or" Robitussin" cough syrup varietis for cough.  You can use plain "Tylenol" or "Advi"l for fever, chills and achyness.

## 2016-02-23 NOTE — Progress Notes (Signed)
Pre visit review using our clinic review tool, if applicable. No additional management support is needed unless otherwise documented below in the visit note. 

## 2016-02-23 NOTE — Progress Notes (Signed)
Subjective:  Patient ID: [name], male    DOB: [birth year]  Age: 75 y.o. MRN: [id number]  CC: Nasal Congestion (cold,congestion,deep cough w/little yellow mucus for 2 wks. took mucinex, cloricidan. )   Cough  This is a new problem. The current episode started 1 to 4 weeks ago. The problem has been gradually worsening. The problem occurs constantly. The cough is productive of purulent sputum. Associated symptoms include nasal congestion, postnasal drip, rhinorrhea, shortness of breath and wheezing. Pertinent negatives include no chest pain. The symptoms are aggravated by lying down. Risk factors for lung disease include smoking/tobacco exposure. He has tried OTC cough suppressant for the symptoms. The treatment provided no relief. His past medical history is significant for bronchitis. CHF    Outpatient Medications Prior to Visit  Medication Sig Dispense Refill  . ACCU-CHEK AVIVA PLUS test strip USE ONE STRIP TO CHECK GLUCOSE THREE TIMES DAILY AS DIRECTED 100 each 3  . ACCU-CHEK FASTCLIX LANCETS MISC USE ONE  TO CHECK GLUCOSE THREE TIMES DAILY 102 each 5  . acetaminophen (TYLENOL) 325 MG tablet Take 325 mg by mouth every 4 (four) hours as needed for moderate pain.     Marland Kitchen allopurinol (ZYLOPRIM) 100 MG tablet Take 2 tablets (200 mg total) by mouth daily. 180 tablet 1  . amLODipine (NORVASC) 5 MG tablet Take 2.5 mg by mouth daily.    Marland Kitchen aspirin 81 MG tablet Take 81 mg by mouth once a week.     Marland Kitchen atorvastatin (LIPITOR) 10 MG tablet Take 1 tablet (10 mg total) by mouth daily. 90 tablet 3  . carboxymethylcellulose (REFRESH PLUS) 0.5 % SOLN Place 2 drops into both eyes daily.     . carvedilol (COREG) 6.25 MG tablet Take 1 tablet (6.25 mg total) by mouth 2 (two) times daily. 180 tablet 3  . chlorthalidone (HYGROTON) 25 MG tablet Take 1 tablet by mouth daily when weight reaches 255 lbs or greater.    . Cholecalciferol 10000 UNITS CAPS Take 1,000 Units by mouth daily.    . colchicine 0.6 MG  tablet Take 0.6 mg by mouth daily as needed (gout).     . furosemide (LASIX) 80 MG tablet TAKE ONE TABLET BY MOUTH TWICE DAILY 180 tablet 3  . gabapentin (NEURONTIN) 300 MG capsule TAKE ONE CAPSULE BY MOUTH 4 TIMES DAILY AS NEEDED FOR LEG PAIN 360 capsule 3  . insulin detemir (LEVEMIR) 100 UNIT/ML injection Inject 20 Units into the skin 2 (two) times daily.     . insulin regular (NOVOLIN R) 100 units/mL injection Inject 30 Units into the skin 2 (two) times daily before a meal.    . ipratropium (ATROVENT) 0.06 % nasal spray Place 1 spray into both nostrils daily.     Marland Kitchen losartan (COZAAR) 50 MG tablet TAKE ONE TABLET BY MOUTH ONCE DAILY 90 tablet 3  . methocarbamol (ROBAXIN) 750 MG tablet Take 750 mg by mouth 2 (two) times daily as needed for muscle spasms.    . naphazoline-pheniramine (NAPHCON-A) 0.025-0.3 % ophthalmic solution Place 1 drop into both eyes daily.     . nitroGLYCERIN (NITROSTAT) 0.4 MG SL tablet Place 1 tablet (0.4 mg total) under the tongue every 5 (five) minutes as needed for chest pain (MAX 3 TABLETS). 25 tablet 3  . omeprazole (PRILOSEC) 20 MG capsule Take 20 mg by mouth 2 (two) times daily before a meal.    . potassium chloride SA (K-DUR,KLOR-CON) 20 MEQ tablet Take 1.5 tablets (30 mEq total) by mouth  2 (two) times daily. TAKE 1.5 TWICE DAILY TO TOTAL 30 MEQ 270 tablet 3  . warfarin (COUMADIN) 2.5 MG tablet TAKE ONE TABLET BY MOUTH AS DIRECTED 120 tablet 1   No facility-administered medications prior to visit.     ROS See HPI  Objective:  BP 108/68   Pulse 86   Temp 97.4 F (36.3 C)   SpO2 98%   BP Readings from Last 3 Encounters:  02/23/16 108/68  02/03/16 (!) 92/56  01/14/16 120/70    Wt Readings from Last 3 Encounters:  02/03/16 265 lb (120.2 kg)  01/14/16 261 lb (118.4 kg)  01/01/16 261 lb (118.4 kg)    Physical Exam  Constitutional: He is oriented to person, place, and time. No distress.  HENT:  Right Ear: Tympanic membrane, external ear and ear canal  normal.  Left Ear: Tympanic membrane and ear canal normal.  Nose: Mucosal edema and rhinorrhea present. Right sinus exhibits no maxillary sinus tenderness and no frontal sinus tenderness. Left sinus exhibits no maxillary sinus tenderness and no frontal sinus tenderness.  Mouth/Throat: Uvula is midline. Posterior oropharyngeal erythema present. No oropharyngeal exudate.  Eyes: No scleral icterus.  Neck: Normal range of motion. Neck supple.  Cardiovascular: Normal rate and regular rhythm.   Pulmonary/Chest: Effort normal. He has rales.  Rales in LLL  Musculoskeletal: He exhibits edema.  No change in LE edema per patient.  Lymphadenopathy:    He has cervical adenopathy.  Neurological: He is alert and oriented to person, place, and time.  Vitals reviewed.   Lab Results  Component Value Date   WBC 9.0 12/30/2015   HGB 15.5 12/30/2015   HCT 46.7 12/30/2015   PLT 180 12/30/2015   GLUCOSE 118 (H) 01/11/2016   CHOL 143 10/12/2015   TRIG 157.0 (H) 10/12/2015   HDL 38.70 (L) 10/12/2015   LDLCALC 73 10/12/2015   ALT 12 12/30/2015   AST 14 12/30/2015   NA 140 01/11/2016   K 3.7 01/11/2016   CL 103 01/11/2016   CREATININE 1.82 (H) 01/11/2016   BUN 35 (H) 01/11/2016   CO2 29 01/11/2016   TSH 0.59 10/07/2014   PSA 0.33 01/07/2010   INR 2.1 02/15/2016   HGBA1C 6.6 (H) 01/11/2016   MICROALBUR 1.2 10/12/2015    Ct Cervical Spine W Contrast  Result Date: 12/24/2014 CLINICAL DATA:  Previous lumbar and cervical fusion. Back pain, neck pain in weakness. Symptoms worsening over time. FLUOROSCOPY TIME:  1 minutes 1 second. One thousand five hundred thirty-three micro gray meter squared PROCEDURE: CERVICAL AND LUMBAR  MYELOGRAM CT CERVICAL MYELOGRAM CT LUMBAR MYELOGRAM Consent and injection were performed by Dr.Elsner. Lumbar filming was performed initially. The patient was then moved to the trendelenburg position and contrast flowed into the Thoracic and Cervical spine regions. I personally  performed the lumbar puncture and administered the intrathecal contrast. I also personally supervised acquisition of the myelogram images. TECHNIQUE: Contiguous axial images were obtained through the Cervical and Lumbar spine after the intrathecal infusion of infusion. Coronal and sagittal reconstructions were obtained of the axial image sets. FINDINGS: CERVICAL AND LUMBAR MYELOGRAM FINDINGS: Previous lumbar fusion. No stenosis in that region. No visible focal neural compression. Contrast flows through the thoracic region without the lead a. cervical images do not show any canal stenosis or visible neural compression. CT CERVICAL MYELOGRAM FINDINGS: The foramen magnum is widely patent. There is osteoarthritis at the C1-2 articulation without encroachment upon the neural structures. C2-3: No disc or facet pathology. Prominent anterior bridging  osteophytes. C3 through C5: Previous ACDF. Fusion is solid. Sufficient central canal patency. Mild foraminal encroachment by osteophytes on the left at C4-5. Other foramina sufficiently patent. C5-6: Prominent anterior bridging osteophytes. Endplate osteophytes and bulging of the disc. Foraminal stenosis bilaterally because of encroachment by uncovertebral osteophytes and disc material. Either or both C6 nerve roots could be affected. C6-7: Prominent anterior bridging osteophytes. No central canal stenosis. Mild foraminal encroachment by uncovertebral and facet osteophytes but no likely compressive stenosis. C7-T1: Facet degeneration without slippage. No central canal stenosis. No significant foraminal narrowing. Large anterior bridging osteophytes from the cervical region extend into the thoracic region. No upper thoracic canal stenosis. CT LUMBAR MYELOGRAM FINDINGS: Scan covers the region from T9 through S2. In the lower thoracic region, there are anterior bridging osteophytes that appear solid. There is no disc pathology. There is wide patency of the canal. Dorsal  neurostimulator at the T9-10 level appears well positioned. T12-L1: Mild pole level. Anterior osteophytes which do not appear solid. Bulging of the disc which indents the thecal sac slightly. Prominent epidural fat at this level. Mild facet degeneration. No visible compressive stenosis. L1-2: Solid anterior bridging osteophytes and solid facet fusion. No disc pathology. No central canal stenosis. Prominent leftward projecting osteophyte could possibly affect the left-sided nerve root in the extra foraminal region. L2 through sacrum: Previous discectomy and fusion throughout the region. Fusion is solid. Wide patency of the canal. Sufficient patency of the foramina. No evidence of hardware complication. Bilateral sacroiliac osteoarthritis. IMPRESSION: Lower thoracic and lumbar region: Solid postsurgical fusion from L2 to the sacrum with sufficient patency of the canal and foramina. Physiologic fusion in the lower thoracic region from T9 to T12 and at the L1-2 level. The T12-L1 level remains a mobile level, though there are large anterior osteophytes but not apparent complete solid union. There is bulging of the disc. There is mild facet and ligamentous hypertrophy. No compressive stenosis however. 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 Cervical region: Solid postsurgical fusion from C3 through C5. 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 There would be potential to affect the C6 nerve roots. No evidence of compressive central canal stenosis. Electronically Signed   By: Nelson Chimes M.D.   On: 12/24/2014 10:22   Ct Lumbar Spine W Contrast  Result Date: 12/24/2014 CLINICAL DATA:  Previous lumbar and cervical fusion. Back pain, neck pain in weakness. Symptoms worsening over time. FLUOROSCOPY TIME:  1 minutes 1 second. One thousand five hundred thirty-three micro gray meter squared PROCEDURE: CERVICAL AND LUMBAR   MYELOGRAM CT CERVICAL MYELOGRAM CT LUMBAR MYELOGRAM Consent and injection were performed by Dr.Elsner. Lumbar filming was performed initially. The patient was then moved to the trendelenburg position and contrast flowed into the Thoracic and Cervical spine regions. I personally performed the lumbar puncture and administered the intrathecal contrast. I also personally supervised acquisition of the myelogram images. TECHNIQUE: Contiguous axial images were obtained through the Cervical and Lumbar spine after the intrathecal infusion of infusion. Coronal and sagittal reconstructions were obtained of the axial image sets. FINDINGS: CERVICAL AND LUMBAR MYELOGRAM FINDINGS: Previous lumbar fusion. No stenosis in that region. No visible focal neural compression. Contrast flows through the thoracic region without the lead a. cervical images do not show any canal stenosis or visible neural compression. CT CERVICAL MYELOGRAM FINDINGS: The foramen magnum is widely patent. There is osteoarthritis at the C1-2 articulation without encroachment upon the neural structures. C2-3: No disc or facet  pathology. Prominent anterior bridging osteophytes. C3 through C5: Previous ACDF. Fusion is solid. Sufficient central canal patency. Mild foraminal encroachment by osteophytes on the left at C4-5. Other foramina sufficiently patent. C5-6: Prominent anterior bridging osteophytes. Endplate osteophytes and bulging of the disc. Foraminal stenosis bilaterally because of encroachment by uncovertebral osteophytes and disc material. Either or both C6 nerve roots could be affected. C6-7: Prominent anterior bridging osteophytes. No central canal stenosis. Mild foraminal encroachment by uncovertebral and facet osteophytes but no likely compressive stenosis. C7-T1: Facet degeneration without slippage. No central canal stenosis. No significant foraminal narrowing. Large anterior bridging osteophytes from the cervical region extend into the thoracic  region. No upper thoracic canal stenosis. CT LUMBAR MYELOGRAM FINDINGS: Scan covers the region from T9 through S2. In the lower thoracic region, there are anterior bridging osteophytes that appear solid. There is no disc pathology. There is wide patency of the canal. Dorsal neurostimulator at the T9-10 level appears well positioned. T12-L1: Mild pole level. Anterior osteophytes which do not appear solid. Bulging of the disc which indents the thecal sac slightly. Prominent epidural fat at this level. Mild facet degeneration. No visible compressive stenosis. L1-2: Solid anterior bridging osteophytes and solid facet fusion. No disc pathology. No central canal stenosis. Prominent leftward projecting osteophyte could possibly affect the left-sided nerve root in the extra foraminal region. L2 through sacrum: Previous discectomy and fusion throughout the region. Fusion is solid. Wide patency of the canal. Sufficient patency of the foramina. No evidence of hardware complication. Bilateral sacroiliac osteoarthritis. IMPRESSION: Lower thoracic and lumbar region: Solid postsurgical fusion from L2 to the sacrum with sufficient patency of the canal and foramina. Physiologic fusion in the lower thoracic region from T9 to T12 and at the L1-2 level. The T12-L1 level remains a mobile level, though there are large anterior osteophytes but not apparent complete solid union. There is bulging of the disc. There is mild facet and ligamentous hypertrophy. No compressive stenosis however. 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 Cervical region: Solid postsurgical fusion from C3 through C5. 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 There would be potential to affect the C6 nerve roots. No evidence of compressive central canal stenosis. Electronically Signed   By: Nelson Chimes M.D.   On: 12/24/2014 10:22   Ct T Spine Ltd Wo Or W/ Cm  Result  Date: 12/24/2014 CLINICAL DATA:  Low back pain and leg weakness. Neck pain and arm pain. EXAM: CT THORACIC SPINE WITHOUT CONTRAST TECHNIQUE: Multidetector CT imaging of the thoracic spine was performed without intravenous contrast administration. Multiplanar CT image reconstructions were also generated. COMPARISON:  Cervical and lumbar study same day FINDINGS: This limited examination shows solid fusion from T9 through T12 due to anterior osteophytes. The lower thoracic spinal canal is widely patent. Dorsal neurostimulator at the T9 and T10 level appears well positioned. See lumbar report for the right the lumbar junction region findings at the mobile T12-L1 level. IMPRESSION: Solid lower thoracic fusion. No complication or stenosis seen in the region from T9 through T12. See lumbar report fourth right got lumbar junction description. Electronically Signed   By: Nelson Chimes M.D.   On: 12/24/2014 10:28   Dg Myelogram 2+ Regions  Result Date: 12/24/2014 CLINICAL DATA:  Previous lumbar and cervical fusion. Back pain, neck pain in weakness. Symptoms worsening over time. FLUOROSCOPY TIME:  1 minutes 1 second. One thousand five hundred thirty-three micro gray meter squared PROCEDURE: CERVICAL AND LUMBAR  MYELOGRAM CT CERVICAL MYELOGRAM CT LUMBAR MYELOGRAM Consent and injection were performed by Dr.Elsner. Lumbar filming was performed initially. The patient was then moved to the trendelenburg position and contrast flowed into the Thoracic and Cervical spine regions. I personally performed the lumbar puncture and administered the intrathecal contrast. I also personally supervised acquisition of the myelogram images. TECHNIQUE: Contiguous axial images were obtained through the Cervical and Lumbar spine after the intrathecal infusion of infusion. Coronal and sagittal reconstructions were obtained of the axial image sets. FINDINGS: CERVICAL AND LUMBAR MYELOGRAM FINDINGS: Previous lumbar fusion. No stenosis in that region.  No visible focal neural compression. Contrast flows through the thoracic region without the lead a. cervical images do not show any canal stenosis or visible neural compression. CT CERVICAL MYELOGRAM FINDINGS: The foramen magnum is widely patent. There is osteoarthritis at the C1-2 articulation without encroachment upon the neural structures. C2-3: No disc or facet pathology. Prominent anterior bridging osteophytes. C3 through C5: Previous ACDF. Fusion is solid. Sufficient central canal patency. Mild foraminal encroachment by osteophytes on the left at C4-5. Other foramina sufficiently patent. C5-6: Prominent anterior bridging osteophytes. Endplate osteophytes and bulging of the disc. Foraminal stenosis bilaterally because of encroachment by uncovertebral osteophytes and disc material. Either or both C6 nerve roots could be affected. C6-7: Prominent anterior bridging osteophytes. No central canal stenosis. Mild foraminal encroachment by uncovertebral and facet osteophytes but no likely compressive stenosis. C7-T1: Facet degeneration without slippage. No central canal stenosis. No significant foraminal narrowing. Large anterior bridging osteophytes from the cervical region extend into the thoracic region. No upper thoracic canal stenosis. CT LUMBAR MYELOGRAM FINDINGS: Scan covers the region from T9 through S2. In the lower thoracic region, there are anterior bridging osteophytes that appear solid. There is no disc pathology. There is wide patency of the canal. Dorsal neurostimulator at the T9-10 level appears well positioned. T12-L1: Mild pole level. Anterior osteophytes which do not appear solid. Bulging of the disc which indents the thecal sac slightly. Prominent epidural fat at this level. Mild facet degeneration. No visible compressive stenosis. L1-2: Solid anterior bridging osteophytes and solid facet fusion. No disc pathology. No central canal stenosis. Prominent leftward projecting osteophyte could possibly  affect the left-sided nerve root in the extra foraminal region. L2 through sacrum: Previous discectomy and fusion throughout the region. Fusion is solid. Wide patency of the canal. Sufficient patency of the foramina. No evidence of hardware complication. Bilateral sacroiliac osteoarthritis. IMPRESSION: Lower thoracic and lumbar region: Solid postsurgical fusion from L2 to the sacrum with sufficient patency of the canal and foramina. Physiologic fusion in the lower thoracic region from T9 to T12 and at the L1-2 level. The T12-L1 level remains a mobile level, though there are large anterior osteophytes but not apparent complete solid union. There is bulging of the disc. There is mild facet and ligamentous hypertrophy. No compressive stenosis however. 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 Cervical region: Solid postsurgical fusion from C3 through C5. 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 There would be potential to affect the C6 nerve roots. No evidence of compressive central canal stenosis. Electronically Signed   By: Nelson Chimes M.D.   On: 12/24/2014 10:22    Assessment & Plan:   Earl was seen today for nasal congestion.  Diagnoses and all orders for this visit:  Acute bronchitis, unspecified organism -     DG Chest 2 View; Future -     guaiFENesin (MUCINEX) 600  MG 12 hr tablet; Take 1 tablet (600 mg total) by mouth 2 (two) times daily as needed for cough or to loosen phlegm. -     HYDROcodone-homatropine (HYCODAN) 5-1.5 MG/5ML syrup; Take 5 mLs by mouth at bedtime as needed for cough. -     doxycycline (VIBRA-TABS) 100 MG tablet; Take 1 tablet (100 mg total) by mouth 2 (two) times daily.  Abnormal lung sounds -     DG Chest 2 View; Future -     doxycycline (VIBRA-TABS) 100 MG tablet; Take 1 tablet (100 mg total) by mouth 2 (two) times daily.   I am having Mr. Humbard start on guaiFENesin,  HYDROcodone-homatropine, and doxycycline. I am also having him maintain his ipratropium, aspirin, Cholecalciferol, naphazoline-pheniramine, amLODipine, carboxymethylcellulose, acetaminophen, nitroGLYCERIN, carvedilol, ACCU-CHEK AVIVA PLUS, atorvastatin, allopurinol, furosemide, potassium chloride SA, ACCU-CHEK FASTCLIX LANCETS, gabapentin, insulin detemir, insulin regular, omeprazole, colchicine, warfarin, chlorthalidone, methocarbamol, and losartan.  Meds ordered this encounter  Medications  . guaiFENesin (MUCINEX) 600 MG 12 hr tablet    Sig: Take 1 tablet (600 mg total) by mouth 2 (two) times daily as needed for cough or to loosen phlegm.    Dispense:  14 tablet    Refill:  0    Order Specific Question:   Supervising Provider    Answer:   Cassandria Anger [[id number]]  . HYDROcodone-homatropine (HYCODAN) 5-1.5 MG/5ML syrup    Sig: Take 5 mLs by mouth at bedtime as needed for cough.    Dispense:  120 mL    Refill:  0    Order Specific Question:   Supervising Provider    Answer:   Cassandria Anger [[id number]]  . doxycycline (VIBRA-TABS) 100 MG tablet    Sig: Take 1 tablet (100 mg total) by mouth 2 (two) times daily.    Dispense:  14 tablet    Refill:  0    Order Specific Question:   Supervising Provider    Answer:   Cassandria Anger [[id number]]   Follow-up: Return if symptoms worsen or fail to improve.  Wilfred Lacy, NP

## 2016-03-02 DIAGNOSIS — M47816 Spondylosis without myelopathy or radiculopathy, lumbar region: Secondary | ICD-10-CM | POA: Diagnosis not present

## 2016-03-02 DIAGNOSIS — M549 Dorsalgia, unspecified: Secondary | ICD-10-CM | POA: Diagnosis not present

## 2016-03-04 ENCOUNTER — Telehealth: Payer: Self-pay | Admitting: Pharmacist Clinician (PhC)/ Clinical Pharmacy Specialist

## 2016-03-04 NOTE — Telephone Encounter (Signed)
Received fax from [facility], needing clearance to hold warfarin x 3 days prior to upcoming nerve root block injection.  Per office protocol, patient with CHADS2 score of 3, CHADS2-VASc of 5.  No bridging needed.    Called patient home, spoke with wife, she is aware that he is cleared to hold warfarin x 3 days

## 2016-03-05 ENCOUNTER — Encounter: Payer: Self-pay | Admitting: Internal Medicine

## 2016-03-09 ENCOUNTER — Ambulatory Visit (HOSPITAL_BASED_OUTPATIENT_CLINIC_OR_DEPARTMENT_OTHER): Payer: Medicare Other | Admitting: Hematology & Oncology

## 2016-03-09 ENCOUNTER — Encounter: Payer: Self-pay | Admitting: Oncology

## 2016-03-09 ENCOUNTER — Other Ambulatory Visit (HOSPITAL_BASED_OUTPATIENT_CLINIC_OR_DEPARTMENT_OTHER): Payer: Medicare Other

## 2016-03-09 VITALS — BP 117/65 | HR 79 | Temp 97.8°F | Resp 20 | Wt 257.0 lb

## 2016-03-09 DIAGNOSIS — D45 Polycythemia vera: Secondary | ICD-10-CM | POA: Diagnosis not present

## 2016-03-09 DIAGNOSIS — Z86718 Personal history of other venous thrombosis and embolism: Secondary | ICD-10-CM

## 2016-03-09 LAB — CBC WITH DIFFERENTIAL (CANCER CENTER ONLY)
BASO#: 0 10*3/uL (ref 0.0–0.2)
BASO%: 0.2 % (ref 0.0–2.0)
EOS ABS: 0.1 10*3/uL (ref 0.0–0.5)
EOS%: 0.9 % (ref 0.0–7.0)
HCT: 44.3 % (ref 38.7–49.9)
HEMOGLOBIN: 14.5 g/dL (ref 13.0–17.1)
LYMPH#: 1.2 10*3/uL (ref 0.9–3.3)
LYMPH%: 13.7 % — AB (ref 14.0–48.0)
MCH: 27.7 pg — AB (ref 28.0–33.4)
MCHC: 32.7 g/dL (ref 32.0–35.9)
MCV: 85 fL (ref 82–98)
MONO#: 0.7 10*3/uL (ref 0.1–0.9)
MONO%: 7.7 % (ref 0.0–13.0)
NEUT%: 77.5 % (ref 40.0–80.0)
NEUTROS ABS: 6.6 10*3/uL — AB (ref 1.5–6.5)
PLATELETS: 184 10*3/uL (ref 145–400)
RBC: 5.24 10*6/uL (ref 4.20–5.70)
RDW: 17.4 % — ABNORMAL HIGH (ref 11.1–15.7)
WBC: 8.5 10*3/uL (ref 4.0–10.0)

## 2016-03-09 LAB — COMPREHENSIVE METABOLIC PANEL
ALBUMIN: 3.4 g/dL — AB (ref 3.5–5.0)
ALK PHOS: 97 U/L (ref 40–150)
ALT: 10 U/L (ref 0–55)
ANION GAP: 11 meq/L (ref 3–11)
AST: 13 U/L (ref 5–34)
BILIRUBIN TOTAL: 0.88 mg/dL (ref 0.20–1.20)
BUN: 28.8 mg/dL — ABNORMAL HIGH (ref 7.0–26.0)
CO2: 25 meq/L (ref 22–29)
CREATININE: 1.9 mg/dL — AB (ref 0.7–1.3)
Calcium: 9.2 mg/dL (ref 8.4–10.4)
Chloride: 105 mEq/L (ref 98–109)
EGFR: 34 mL/min/{1.73_m2} — ABNORMAL LOW (ref >90)
GLUCOSE: 169 mg/dL — AB (ref 70–140)
Potassium: 4.5 mEq/L (ref 3.5–5.1)
Sodium: 141 mEq/L (ref 136–145)
TOTAL PROTEIN: 6.5 g/dL (ref 6.4–8.3)

## 2016-03-09 NOTE — Progress Notes (Signed)
Hematology and Oncology Follow Up Visit  [name] [id number] 23-Jan-1941 75 y.o. 03/09/2016   Principle Diagnosis:   Polycythemia vera  Current Therapy:    Phlebotomy to maintain hematocrit below 45%.       Aspirin 81 mg by mouth weekly       Coumadin for chronic atrial fibrillation      Interim History:  [name] is back for follow-up.he does feel a little more tired. I suspect his blood probably is on the higher side.  He's had no obvious bleeding. He is on Coumadin.  He is still having back issues. He has had a myelogram. He has had some epidural steroids. He still is having quite a lot of problems for this.  He and his wife will be going to the beach after Christmas. [place] there. They really enjoy going down.  Last time we saw him in October, his ferritin was 40 with an iron saturation of 20%.   He's had no chest pain. He's had no cough or shortness of breath.  He's had no weight loss or weight gain. He's had no leg swelling. His been no change in his medications.  Overall, his performance status is ECOG 1-2.   Medications:  Current Outpatient Prescriptions:  .  ACCU-CHEK AVIVA PLUS test strip, USE ONE STRIP TO CHECK GLUCOSE THREE TIMES DAILY AS DIRECTED, Disp: 100 each, Rfl: 3 .  ACCU-CHEK FASTCLIX LANCETS MISC, USE ONE  TO CHECK GLUCOSE THREE TIMES DAILY, Disp: 102 each, Rfl: 5 .  acetaminophen (TYLENOL) 325 MG tablet, Take 325 mg by mouth every 4 (four) hours as needed for moderate pain. , Disp: , Rfl:  .  allopurinol (ZYLOPRIM) 100 MG tablet, Take 2 tablets (200 mg total) by mouth daily., Disp: 180 tablet, Rfl: 1 .  amLODipine (NORVASC) 5 MG tablet, Take 2.5 mg by mouth daily., Disp: , Rfl:  .  aspirin 81 MG tablet, Take 81 mg by mouth once a week. , Disp: , Rfl:  .  atorvastatin (LIPITOR) 10 MG tablet, Take 1 tablet (10 mg total) by mouth daily., Disp: 90 tablet, Rfl: 3 .  carboxymethylcellulose (REFRESH PLUS) 0.5 % SOLN, Place 2 drops into  both eyes daily. , Disp: , Rfl:  .  carvedilol (COREG) 6.25 MG tablet, Take 1 tablet (6.25 mg total) by mouth 2 (two) times daily., Disp: 180 tablet, Rfl: 3 .  chlorthalidone (HYGROTON) 25 MG tablet, Take 1 tablet by mouth daily when weight reaches 255 lbs or greater., Disp: , Rfl:  .  Cholecalciferol 1000 units tablet, Take 1,000 Units by mouth daily., Disp: , Rfl:  .  colchicine 0.6 MG tablet, Take 0.6 mg by mouth daily as needed (gout). , Disp: , Rfl:  .  furosemide (LASIX) 80 MG tablet, TAKE ONE TABLET BY MOUTH TWICE DAILY, Disp: 180 tablet, Rfl: 3 .  gabapentin (NEURONTIN) 300 MG capsule, TAKE ONE CAPSULE BY MOUTH 4 TIMES DAILY AS NEEDED FOR LEG PAIN (Patient taking differently: TAKE ONE CAPSULE BY MOUTH 5 TIMES DAILY AS NEEDED FOR LEG PAIN), Disp: 360 capsule, Rfl: 3 .  insulin detemir (LEVEMIR) 100 UNIT/ML injection, Inject 15 Units into the skin 2 (two) times daily. , Disp: , Rfl:  .  insulin regular (NOVOLIN R) 100 units/mL injection, Inject 20 Units into the skin 2 (two) times daily before a meal. , Disp: , Rfl:  .  ipratropium (ATROVENT) 0.06 % nasal spray, Place 1 spray into both nostrils daily. , Disp: ,  Rfl:  .  losartan (COZAAR) 50 MG tablet, TAKE ONE TABLET BY MOUTH ONCE DAILY, Disp: 90 tablet, Rfl: 3 .  methocarbamol (ROBAXIN) 750 MG tablet, Take 750 mg by mouth 2 (two) times daily as needed for muscle spasms., Disp: , Rfl:  .  nitroGLYCERIN (NITROSTAT) 0.4 MG SL tablet, Place 1 tablet (0.4 mg total) under the tongue every 5 (five) minutes as needed for chest pain (MAX 3 TABLETS)., Disp: 25 tablet, Rfl: 3 .  omeprazole (PRILOSEC) 20 MG capsule, Take 20 mg by mouth 2 (two) times daily before a meal., Disp: , Rfl:  .  potassium chloride SA (K-DUR,KLOR-CON) 20 MEQ tablet, Take 1.5 tablets (30 mEq total) by mouth 2 (two) times daily. TAKE 1.5 TWICE DAILY TO TOTAL 30 MEQ, Disp: 270 tablet, Rfl: 3 .  warfarin (COUMADIN) 2.5 MG tablet, TAKE ONE TABLET BY MOUTH AS DIRECTED (Patient taking  differently: Takes 2.5 mg daily except 5mg  on Wedneday.), Disp: 120 tablet, Rfl: 1  Allergies:  Allergies  Allergen Reactions  . Avelox [Moxifloxacin Hcl In Nacl] Swelling, Rash and Other (See Comments)    Patient became hypotensive after infusion started Because of a history of documented adverse serious drug reaction;Medi Alert bracelet  is recommended  . Penicillins Anaphylaxis, Other (See Comments) and Swelling    REACTION: anaphylaxis Because of a history of documented adverse serious drug reaction;Medi Alert bracelet  is recommended    Past Medical History, Surgical history, Social history, and Family History were reviewed and updated.  Review of Systems: As above  Physical Exam:  weight is 257 lb (116.6 kg). His oral temperature is 97.8 F (36.6 C). His blood pressure is 117/65 and his pulse is 79. His respiration is 20 and oxygen saturation is 99%.   Well-developed and well-nourished white [name]. He is moderately obese. Head and neck exam shows no ocular or oral lesions. There is less conjunctival inflammation. There is no adenopathy in his neck. Lungs are clear. He has no rales, wheezes or rhonchi. Cardiac exam irregular rate and rhythm consistent with atrial fibrillation, he has  no murmurs, rubs or bruits. Abdomen is soft. He has good bowel sounds. He is somewhat obese. There is no palpable liver or spleen tip. Back exam no tenderness over the spine, ribs or hips. Extremities shows some chronic mild nonpitting edema of the legs. Skin exam no rashes. He still has a little bit of a ruddy complexion. Neurological exam is nonfocal.  Lab Results  Component Value Date   WBC 8.5 03/09/2016   HGB 14.5 03/09/2016   HCT 44.3 03/09/2016   MCV 85 03/09/2016   PLT 184 03/09/2016     Chemistry      Component Value Date/Time   NA 140 01/11/2016 1323   NA 138 12/30/2015 1327   NA 140 11/04/2015 1330   K 3.7 01/11/2016 1323   K 3.7 12/30/2015 1327   K 4.2 11/04/2015 1330   CL 103  01/11/2016 1323   CL 100 12/30/2015 1327   CL 100 04/22/2014 1350   CO2 29 01/11/2016 1323   CO2 28 12/30/2015 1327   CO2 24 11/04/2015 1330   BUN 35 (H) 01/11/2016 1323   BUN 37 (H) 12/30/2015 1327   BUN 33.9 (H) 11/04/2015 1330   CREATININE 1.82 (H) 01/11/2016 1323   CREATININE 1.60 (H) 12/30/2015 1327   CREATININE 1.9 (H) 11/04/2015 1330      Component Value Date/Time   CALCIUM 9.1 01/11/2016 1323   CALCIUM 9.1 12/30/2015 1327  CALCIUM 9.2 11/04/2015 1330   ALKPHOS 78 12/30/2015 1327   ALKPHOS 70 11/04/2015 1330   AST 14 12/30/2015 1327   AST 15 11/04/2015 1330   ALT 12 12/30/2015 1327   ALT 12 11/04/2015 1330   BILITOT 0.6 12/30/2015 1327   BILITOT 0.81 11/04/2015 1330         Impression and Plan: Mr. Shinder is 75 year old male with polycythemia. His hemoglobin is doing great right now. As such, he does not need to be phlebotomized.  I think we can probably get him back now in 3 months. I think this would be very reasonable.   I feel bad about his back. It just does not sound like there is much that can be done for this.  Hopefully if he can lose some weight, this might help.  Volanda Napoleon, MD 12/20/20172:16 PM

## 2016-03-10 ENCOUNTER — Encounter: Payer: Self-pay | Admitting: Internal Medicine

## 2016-03-12 NOTE — Telephone Encounter (Signed)
Can you see if you can get the report and update his chart.  Thanks.

## 2016-03-15 ENCOUNTER — Emergency Department (HOSPITAL_COMMUNITY): Payer: Medicare Other

## 2016-03-15 ENCOUNTER — Other Ambulatory Visit: Payer: Self-pay

## 2016-03-15 ENCOUNTER — Inpatient Hospital Stay (HOSPITAL_COMMUNITY)
Admission: EM | Admit: 2016-03-15 | Discharge: 2016-03-23 | DRG: 287 | Disposition: A | Discharge status: Home / Self Care | Payer: Medicare Other | Attending: Cardiology | Admitting: Cardiology

## 2016-03-15 ENCOUNTER — Encounter (HOSPITAL_COMMUNITY): Payer: Self-pay | Admitting: Vascular Surgery

## 2016-03-15 ENCOUNTER — Telehealth: Payer: Self-pay | Admitting: Interventional Cardiology

## 2016-03-15 DIAGNOSIS — I358 Other nonrheumatic aortic valve disorders: Secondary | ICD-10-CM | POA: Diagnosis present

## 2016-03-15 DIAGNOSIS — Z951 Presence of aortocoronary bypass graft: Secondary | ICD-10-CM

## 2016-03-15 DIAGNOSIS — I209 Angina pectoris, unspecified: Secondary | ICD-10-CM | POA: Diagnosis present

## 2016-03-15 DIAGNOSIS — Z794 Long term (current) use of insulin: Secondary | ICD-10-CM | POA: Diagnosis not present

## 2016-03-15 DIAGNOSIS — I252 Old myocardial infarction: Secondary | ICD-10-CM

## 2016-03-15 DIAGNOSIS — R079 Chest pain, unspecified: Secondary | ICD-10-CM | POA: Diagnosis not present

## 2016-03-15 DIAGNOSIS — I5023 Acute on chronic systolic (congestive) heart failure: Secondary | ICD-10-CM | POA: Diagnosis not present

## 2016-03-15 DIAGNOSIS — Z95810 Presence of automatic (implantable) cardiac defibrillator: Secondary | ICD-10-CM | POA: Diagnosis present

## 2016-03-15 DIAGNOSIS — I272 Pulmonary hypertension, unspecified: Secondary | ICD-10-CM | POA: Diagnosis present

## 2016-03-15 DIAGNOSIS — I25110 Atherosclerotic heart disease of native coronary artery with unstable angina pectoris: Secondary | ICD-10-CM | POA: Diagnosis not present

## 2016-03-15 DIAGNOSIS — I208 Other forms of angina pectoris: Secondary | ICD-10-CM | POA: Diagnosis not present

## 2016-03-15 DIAGNOSIS — M1990 Unspecified osteoarthritis, unspecified site: Secondary | ICD-10-CM | POA: Diagnosis not present

## 2016-03-15 DIAGNOSIS — Z7901 Long term (current) use of anticoagulants: Secondary | ICD-10-CM

## 2016-03-15 DIAGNOSIS — Z7982 Long term (current) use of aspirin: Secondary | ICD-10-CM | POA: Diagnosis not present

## 2016-03-15 DIAGNOSIS — E1122 Type 2 diabetes mellitus with diabetic chronic kidney disease: Secondary | ICD-10-CM | POA: Diagnosis not present

## 2016-03-15 DIAGNOSIS — I130 Hypertensive heart and chronic kidney disease with heart failure and stage 1 through stage 4 chronic kidney disease, or unspecified chronic kidney disease: Secondary | ICD-10-CM | POA: Diagnosis not present

## 2016-03-15 DIAGNOSIS — M109 Gout, unspecified: Secondary | ICD-10-CM | POA: Diagnosis not present

## 2016-03-15 DIAGNOSIS — I447 Left bundle-branch block, unspecified: Secondary | ICD-10-CM | POA: Diagnosis present

## 2016-03-15 DIAGNOSIS — I5042 Chronic combined systolic (congestive) and diastolic (congestive) heart failure: Secondary | ICD-10-CM | POA: Diagnosis not present

## 2016-03-15 DIAGNOSIS — D45 Polycythemia vera: Secondary | ICD-10-CM | POA: Diagnosis not present

## 2016-03-15 DIAGNOSIS — J439 Emphysema, unspecified: Secondary | ICD-10-CM | POA: Diagnosis present

## 2016-03-15 DIAGNOSIS — Z79899 Other long term (current) drug therapy: Secondary | ICD-10-CM | POA: Diagnosis not present

## 2016-03-15 DIAGNOSIS — I2510 Atherosclerotic heart disease of native coronary artery without angina pectoris: Secondary | ICD-10-CM | POA: Diagnosis not present

## 2016-03-15 DIAGNOSIS — I482 Chronic atrial fibrillation: Secondary | ICD-10-CM | POA: Diagnosis present

## 2016-03-15 DIAGNOSIS — I10 Essential (primary) hypertension: Secondary | ICD-10-CM | POA: Diagnosis not present

## 2016-03-15 DIAGNOSIS — N183 Chronic kidney disease, stage 3 (moderate): Secondary | ICD-10-CM | POA: Diagnosis present

## 2016-03-15 DIAGNOSIS — R0609 Other forms of dyspnea: Secondary | ICD-10-CM

## 2016-03-15 DIAGNOSIS — R05 Cough: Secondary | ICD-10-CM | POA: Diagnosis not present

## 2016-03-15 DIAGNOSIS — R0602 Shortness of breath: Secondary | ICD-10-CM | POA: Diagnosis not present

## 2016-03-15 DIAGNOSIS — I255 Ischemic cardiomyopathy: Secondary | ICD-10-CM | POA: Diagnosis not present

## 2016-03-15 DIAGNOSIS — Z87891 Personal history of nicotine dependence: Secondary | ICD-10-CM | POA: Diagnosis not present

## 2016-03-15 DIAGNOSIS — I5022 Chronic systolic (congestive) heart failure: Secondary | ICD-10-CM | POA: Diagnosis not present

## 2016-03-15 DIAGNOSIS — E785 Hyperlipidemia, unspecified: Secondary | ICD-10-CM | POA: Diagnosis present

## 2016-03-15 DIAGNOSIS — I4891 Unspecified atrial fibrillation: Secondary | ICD-10-CM | POA: Diagnosis not present

## 2016-03-15 DIAGNOSIS — I4892 Unspecified atrial flutter: Secondary | ICD-10-CM | POA: Diagnosis not present

## 2016-03-15 HISTORY — DX: Presence of cardiac pacemaker: Z95.0

## 2016-03-15 HISTORY — DX: Presence of automatic (implantable) cardiac defibrillator: Z95.810

## 2016-03-15 HISTORY — DX: Type 2 diabetes mellitus without complications: E11.9

## 2016-03-15 LAB — BASIC METABOLIC PANEL
ANION GAP: 12 (ref 5–15)
BUN: 30 mg/dL — ABNORMAL HIGH (ref 6–20)
CALCIUM: 9.1 mg/dL (ref 8.9–10.3)
CHLORIDE: 108 mmol/L (ref 101–111)
CO2: 23 mmol/L (ref 22–32)
CREATININE: 1.61 mg/dL — AB (ref 0.61–1.24)
GFR calc non Af Amer: 40 mL/min — ABNORMAL LOW (ref >60)
GFR, EST AFRICAN AMERICAN: 47 mL/min — AB (ref >60)
Glucose, Bld: 167 mg/dL — ABNORMAL HIGH (ref 65–99)
Potassium: 4 mmol/L (ref 3.5–5.1)
SODIUM: 143 mmol/L (ref 135–145)

## 2016-03-15 LAB — CBC
HCT: 43 % (ref 39.0–52.0)
HEMOGLOBIN: 13.8 g/dL (ref 13.0–17.0)
MCH: 27.2 pg (ref 26.0–34.0)
MCHC: 32.1 g/dL (ref 30.0–36.0)
MCV: 84.6 fL (ref 78.0–100.0)
PLATELETS: 173 10*3/uL (ref 150–400)
RBC: 5.08 MIL/uL (ref 4.22–5.81)
RDW: 17.1 % — ABNORMAL HIGH (ref 11.5–15.5)
WBC: 5.7 10*3/uL (ref 4.0–10.5)

## 2016-03-15 LAB — MAGNESIUM: Magnesium: 2.1 mg/dL (ref 1.7–2.4)

## 2016-03-15 LAB — BRAIN NATRIURETIC PEPTIDE: B Natriuretic Peptide: 610.8 pg/mL — ABNORMAL HIGH (ref 0.0–100.0)

## 2016-03-15 LAB — PROTIME-INR
INR: 2.57
PROTHROMBIN TIME: 28.1 s — AB (ref 11.4–15.2)

## 2016-03-15 LAB — I-STAT TROPONIN, ED: TROPONIN I, POC: 0.02 ng/mL (ref 0.00–0.08)

## 2016-03-15 LAB — GLUCOSE, CAPILLARY: Glucose-Capillary: 149 mg/dL — ABNORMAL HIGH (ref 65–99)

## 2016-03-15 LAB — TROPONIN I: TROPONIN I: 0.03 ng/mL — AB (ref <0.03)

## 2016-03-15 MED ORDER — NITROGLYCERIN 0.4 MG SL SUBL
0.4000 mg | SUBLINGUAL_TABLET | SUBLINGUAL | Status: DC | PRN
Start: 2016-03-15 — End: 2016-03-23

## 2016-03-15 MED ORDER — CARVEDILOL 6.25 MG PO TABS
6.2500 mg | ORAL_TABLET | Freq: Two times a day (BID) | ORAL | Status: DC
  Administered 2016-03-15 – 2016-03-23 (×16): 6.25 mg via ORAL
  Filled 2016-03-15 (×16): qty 1

## 2016-03-15 MED ORDER — GABAPENTIN 300 MG PO CAPS
300.0000 mg | ORAL_CAPSULE | Freq: Four times a day (QID) | ORAL | Status: DC | PRN
  Administered 2016-03-16 – 2016-03-23 (×13): 300 mg via ORAL
  Filled 2016-03-15 (×13): qty 1

## 2016-03-15 MED ORDER — INSULIN ASPART 100 UNIT/ML ~~LOC~~ SOLN
12.0000 [IU] | Freq: Three times a day (TID) | SUBCUTANEOUS | Status: DC
  Administered 2016-03-16: 12 [IU] via SUBCUTANEOUS

## 2016-03-15 MED ORDER — ACETAMINOPHEN 325 MG PO TABS
650.0000 mg | ORAL_TABLET | ORAL | Status: DC | PRN
  Administered 2016-03-19 – 2016-03-23 (×8): 650 mg via ORAL
  Filled 2016-03-15 (×9): qty 2

## 2016-03-15 MED ORDER — ALPRAZOLAM 0.25 MG PO TABS
0.2500 mg | ORAL_TABLET | Freq: Two times a day (BID) | ORAL | Status: DC | PRN
  Administered 2016-03-20: 0.25 mg via ORAL
  Filled 2016-03-15 (×2): qty 1

## 2016-03-15 MED ORDER — LOSARTAN POTASSIUM 50 MG PO TABS: 50.0000 mg | ORAL_TABLET | Freq: Every day | ORAL | Status: DC

## 2016-03-15 MED ORDER — SODIUM CHLORIDE 0.9 % IV SOLN: 250.0000 mL | INTRAVENOUS | Status: DC | PRN

## 2016-03-15 MED ORDER — AMLODIPINE BESYLATE 2.5 MG PO TABS: 2.5000 mg | ORAL_TABLET | Freq: Every day | ORAL | Status: DC

## 2016-03-15 MED ORDER — ONDANSETRON HCL 4 MG/2ML IJ SOLN: 4.0000 mg | Freq: Four times a day (QID) | INTRAMUSCULAR | Status: DC | PRN

## 2016-03-15 MED ORDER — INSULIN ASPART 100 UNIT/ML ~~LOC~~ SOLN: 0.0000 [IU] | Freq: Every day | SUBCUTANEOUS | Status: DC

## 2016-03-15 MED ORDER — FUROSEMIDE 10 MG/ML IJ SOLN: 80.0000 mg | Freq: Once | INTRAMUSCULAR | Status: DC

## 2016-03-15 MED ORDER — CELECOXIB 200 MG PO CAPS
200.0000 mg | ORAL_CAPSULE | Freq: Every day | ORAL | Status: DC | PRN
  Administered 2016-03-16 – 2016-03-18 (×3): 200 mg via ORAL
  Filled 2016-03-15 (×5): qty 1

## 2016-03-15 MED ORDER — ZOLPIDEM TARTRATE 5 MG PO TABS
5.0000 mg | ORAL_TABLET | Freq: Every evening | ORAL | Status: DC | PRN
  Administered 2016-03-18 – 2016-03-22 (×4): 5 mg via ORAL
  Filled 2016-03-15 (×5): qty 1

## 2016-03-15 MED ORDER — INSULIN DETEMIR 100 UNIT/ML ~~LOC~~ SOLN
10.0000 [IU] | Freq: Two times a day (BID) | SUBCUTANEOUS | Status: DC
  Administered 2016-03-15 – 2016-03-23 (×11): 10 [IU] via SUBCUTANEOUS
  Filled 2016-03-15 (×18): qty 0.1

## 2016-03-15 MED ORDER — POLYVINYL ALCOHOL 1.4 % OP SOLN
2.0000 [drp] | Freq: Every day | OPHTHALMIC | Status: DC
  Administered 2016-03-16 – 2016-03-23 (×8): 2 [drp] via OPHTHALMIC
  Filled 2016-03-15: qty 15

## 2016-03-15 MED ORDER — AMLODIPINE BESYLATE 2.5 MG PO TABS
2.5000 mg | ORAL_TABLET | Freq: Every day | ORAL | Status: DC
  Administered 2016-03-16 – 2016-03-23 (×8): 2.5 mg via ORAL
  Filled 2016-03-15 (×8): qty 1

## 2016-03-15 MED ORDER — POTASSIUM CHLORIDE CRYS ER 10 MEQ PO TBCR: 15.0000 meq | EXTENDED_RELEASE_TABLET | Freq: Two times a day (BID) | ORAL | Status: DC

## 2016-03-15 MED ORDER — PANTOPRAZOLE SODIUM 40 MG PO TBEC
40.0000 mg | DELAYED_RELEASE_TABLET | Freq: Every day | ORAL | Status: DC
  Administered 2016-03-16 – 2016-03-23 (×8): 40 mg via ORAL
  Filled 2016-03-15 (×9): qty 1

## 2016-03-15 MED ORDER — SODIUM CHLORIDE 0.9% FLUSH: 3.0000 mL | INTRAVENOUS | Status: DC | PRN

## 2016-03-15 MED ORDER — POTASSIUM CHLORIDE CRYS ER 20 MEQ PO TBCR
30.0000 meq | EXTENDED_RELEASE_TABLET | Freq: Every day | ORAL | Status: DC
  Administered 2016-03-16: 11:00:00 30 meq via ORAL
  Filled 2016-03-15: qty 1

## 2016-03-15 MED ORDER — LOSARTAN POTASSIUM 50 MG PO TABS
50.0000 mg | ORAL_TABLET | Freq: Every day | ORAL | Status: DC
  Administered 2016-03-16 – 2016-03-23 (×8): 50 mg via ORAL
  Filled 2016-03-15 (×9): qty 1

## 2016-03-15 MED ORDER — ALLOPURINOL 100 MG PO TABS
200.0000 mg | ORAL_TABLET | Freq: Every day | ORAL | Status: DC
  Administered 2016-03-16 – 2016-03-23 (×8): 200 mg via ORAL
  Filled 2016-03-15 (×8): qty 2

## 2016-03-15 MED ORDER — PANTOPRAZOLE SODIUM 40 MG PO TBEC
40.0000 mg | DELAYED_RELEASE_TABLET | Freq: Every day | ORAL | Status: DC
Start: 2016-03-15 — End: 2016-03-15

## 2016-03-15 MED ORDER — ASPIRIN EC 81 MG PO TBEC
81.0000 mg | DELAYED_RELEASE_TABLET | Freq: Every day | ORAL | Status: DC
  Administered 2016-03-16 – 2016-03-17 (×2): 81 mg via ORAL
  Filled 2016-03-15 (×2): qty 1

## 2016-03-15 MED ORDER — METHOCARBAMOL 500 MG PO TABS
750.0000 mg | ORAL_TABLET | Freq: Two times a day (BID) | ORAL | Status: DC | PRN
  Administered 2016-03-16 – 2016-03-21 (×2): 750 mg via ORAL
  Filled 2016-03-15 (×2): qty 2

## 2016-03-15 MED ORDER — FUROSEMIDE 10 MG/ML IJ SOLN
80.0000 mg | Freq: Once | INTRAMUSCULAR | Status: AC
  Administered 2016-03-15: 80 mg via INTRAVENOUS
  Filled 2016-03-15: qty 8

## 2016-03-15 MED ORDER — SODIUM CHLORIDE 0.9% FLUSH
3.0000 mL | Freq: Two times a day (BID) | INTRAVENOUS | Status: DC
  Administered 2016-03-15 – 2016-03-19 (×4): 3 mL via INTRAVENOUS

## 2016-03-15 MED ORDER — VITAMIN D 1000 UNITS PO TABS
1000.0000 [IU] | ORAL_TABLET | Freq: Every day | ORAL | Status: DC
  Administered 2016-03-16 – 2016-03-23 (×7): 1000 [IU] via ORAL
  Filled 2016-03-15 (×7): qty 1

## 2016-03-15 MED ORDER — ATORVASTATIN CALCIUM 10 MG PO TABS
10.0000 mg | ORAL_TABLET | Freq: Every day | ORAL | Status: DC
  Administered 2016-03-16 – 2016-03-23 (×8): 10 mg via ORAL
  Filled 2016-03-15 (×8): qty 1

## 2016-03-15 MED ORDER — IPRATROPIUM BROMIDE 0.06 % NA SOLN
1.0000 | Freq: Every day | NASAL | Status: DC
  Administered 2016-03-16 – 2016-03-22 (×5): 1 via NASAL
  Filled 2016-03-15: qty 15

## 2016-03-15 MED ORDER — INSULIN ASPART 100 UNIT/ML ~~LOC~~ SOLN
0.0000 [IU] | Freq: Three times a day (TID) | SUBCUTANEOUS | Status: DC
Start: 2016-03-16 — End: 2016-03-23
  Administered 2016-03-16: 3 [IU] via SUBCUTANEOUS
  Administered 2016-03-17 – 2016-03-23 (×4): 2 [IU] via SUBCUTANEOUS

## 2016-03-15 NOTE — H&P (Signed)
History & Physical    Patient ID: LUCILE BEL MRN: [id number], DOB/AGE: 74-Jun-1942   Admit date: 03/15/2016   Primary Physician: [name], MD Primary Cardiologist: Dr. Freada Bergeron  Patient Profile    75 yo male with PMH of CAD s/p CABG, AF, chronic systolic HF, IDDM, polycythemia vera, PPM/ICD who presented to ED with ongoing, progressive dyspnea.   Past Medical History    Past Medical History:  Diagnosis Date  . AAA (abdominal aortic aneurysm) [facility])    Surgical repair  . Adenomatous colon polyp   . Alcohol ingestion of more than four drinks per week    Excess beer  not a dependency problem  . Aortic valve sclerosis   . Arthritis   . Ascending aortic aneurysm ([place]) 05/10/2014  . Atrial fibrillation ([place])    Previous long-term amiodarone therapy with multiple cardioversions / amiodarone stopped September, 2009  . Atrial flutter [facility])    Started November, 2010, Left-sided and cannot ablate  . Bony abnormality    Patient's manubrium is slightly displaced to the right  . CAD (coronary artery disease)    Catheterization July, 2008... name and vein grafts patent but low cardiac output  . Cardiomyopathy    Ischemic... ICD  . Carotid artery disease ([place])    Doppler, December, 2013, 0-39% bilateral  . Chronotropic incompetence    IV pacing rate adjusted  . Combined systolic and diastolic CHF   . COPD (chronic obstructive pulmonary disease) ([place])   . Diabetes mellitus   . Discolored skin   . Diverticulosis   . Drug therapy    Redness and swelling with Avelox infusion May 24, 2011  . Ejection fraction    Ejection fraction has varied over time from 35-50%.,, Echoes are technically very difficult,,, EF 50%, echo, May 25, 2011, technically very difficult  . Eye abnormality    Ophthalmologist questions a clot in one of his eyes, May, 2012  . Gout   . Hyperlipidemia   . Hypertension   . Internal hemorrhoids   . Left atrial thrombus    Remote past...  cardioversions done since that time  . Left ventricular ejection fraction less than 40%   . Mitral regurgitation    Mild echo  . Myocardial infarction   . Nasal drainage    Chronic  . Overweight(278.02)    November, 2012  . Pericardial effusion   . Pericardial effusion    November, 2011 .. decreased during hospitalization  . Pilonidal cyst   . Pleural effusion    Large loculated effusion on the left side November, 2011. This was tapped. It was exudative. Cytology revealed no cancer no proof of mesothelioma area pulmonary team felt that no further workup was needed  . Pleural thickening   . Pneumonia   . Polycythemia vera ([place]) 07/28/2014  . S/P AAA repair   . S/P ICD (internal cardiac defibrillator) procedure    Dr. Lovena Le 2009... by the pacing  . SOB (shortness of breath)    Large left effusion/ thoracentesis/hospitalization/November, 2011... exudated.. cytology negative.. Dr.Wert.. no proof of mesothelioma  . Spinal cord stimulator status    October, 2013  . Spinal stenosis    Surgery Dr.Elsner  . Thrombophlebitis of superficial veins of upper extremities    Possible venous stenosis from defibrillator  . Venous insufficiency    Toe discoloration chronic  . Ventral hernia    April, 2014, result of his abdominal surgery  . Warfarin anticoagulation   . Wide-complex tachycardia ([place])  Past Surgical History:  Procedure Laterality Date  . ABDOMINAL AORTIC ANEURYSM REPAIR  11/2002  . BACK SURGERY    . BI-VENTRICULAR PACEMAKER INSERTION (CRT-P)  02-11-2013   Pt with previously implanted MDT CRTD downgraded to CRTP by Dr Lovena Le 02-11-13  . COLONOSCOPY W/ POLYPECTOMY    . CORONARY ARTERY BYPASS GRAFT  2004  . IMPLANTABLE CARDIOVERTER DEFIBRILLATOR (ICD) GENERATOR CHANGE N/A 02/11/2013   Procedure: ICD GENERATOR CHANGE;  Surgeon: Evans Lance, MD;  Location: [facility] CATH LAB;  Service: Cardiovascular;  Laterality: N/A;  . INCISION AND DRAINAGE ABSCESS / HEMATOMA OF BURSA / KNEE /  THIGH    . LEG SURGERY Right   . LUMBAR FUSION    . pilonidal cyst removal    . SURGERY SCROTAL / TESTICULAR       Allergies  Allergies  Allergen Reactions  . Avelox [Moxifloxacin Hcl In Nacl] Swelling, Rash and Other (See Comments)    Patient became hypotensive after infusion started Because of a history of documented adverse serious drug reaction;Medi Alert bracelet  is recommended  . Penicillins Anaphylaxis, Other (See Comments) and Swelling    REACTION: anaphylaxis Because of a history of documented adverse serious drug reaction;Medi Alert bracelet  is recommended    History of Present Illness    Mr. Abert is 75 yo male with PMH CAD s/p CABG, AF, chronic systolic HF, IDDM, polycythemia vera . He underwent v4 CABG in 2004, and then AAA repair later that year. EF was decreased at 25% at the time of cath, but has since recovered to 35-40%. He is also followed by Dr. Lovena Le for his PPM/ICD. He was followed by Dr. [name], and then has transitioned to Dr. Irish Lack.   Reports he has been in his usual state of health until the past couple of weeks. States he has had progressive dsypnea that he first noticed with exertion but has now progressed to symptoms with rest. Denies any worsening lower extremity edema or weight gain over the past couple of weeks. Does not report significant chest pain or discomfort, but states he did not have this prior to his CABG in 2004. Also reports some orthopnea, but feels these episodes are coming more often. Called this office this morning stating he was very short of breath and was advised to come to the ED.   In the ED his EKG showed AF, rate controlled. Labs showed stable electrolytes, BNP 610, Hgb 13.8, INR 2.57. CXR without significant edema.   Home Medications    Prior to Admission medications   Medication Sig Start Date End Date Taking? Authorizing Provider  acetaminophen (TYLENOL) 325 MG tablet Take 325 mg by mouth every 4 (four) hours as needed for  moderate pain.    Yes Historical Provider, MD  allopurinol (ZYLOPRIM) 100 MG tablet Take 2 tablets (200 mg total) by mouth daily. 10/09/15  Yes [name], MD  amLODipine (NORVASC) 5 MG tablet Take 2.5 mg by mouth daily.   Yes Historical Provider, MD  aspirin 81 MG tablet Take 81 mg by mouth once a week. Take on sundays   Yes Historical Provider, MD  atorvastatin (LIPITOR) 10 MG tablet Take 1 tablet (10 mg total) by mouth daily. 10/09/15  Yes [name], MD  carboxymethylcellulose (REFRESH PLUS) 0.5 % SOLN Place 2 drops into both eyes daily.    Yes Historical Provider, MD  carvedilol (COREG) 6.25 MG tablet Take 1 tablet (6.25 mg total) by mouth 2 (two) times daily. 08/31/15  Yes  Jettie Booze, MD  celecoxib (CELEBREX) 200 MG capsule Take 200 mg by mouth daily as needed for mild pain.   Yes Historical Provider, MD  chlorthalidone (HYGROTON) 25 MG tablet Take 1 tablet by mouth daily when weight reaches 255 lbs or greater.   Yes Historical Provider, MD  Cholecalciferol 1000 units tablet Take 1,000 Units by mouth daily.   Yes Historical Provider, MD  colchicine 0.6 MG tablet Take 0.6 mg by mouth daily as needed (gout).    Yes Historical Provider, MD  furosemide (LASIX) 80 MG tablet TAKE ONE TABLET BY MOUTH TWICE DAILY 10/15/15  Yes Carlena Bjornstad, MD  gabapentin (NEURONTIN) 300 MG capsule TAKE ONE CAPSULE BY MOUTH 4 TIMES DAILY AS NEEDED FOR LEG PAIN Patient taking differently: TAKE ONE CAPSULE BY MOUTH 5 TIMES DAILY AS NEEDED FOR LEG PAIN 12/04/15  Yes Elayne Snare, MD  insulin detemir (LEVEMIR) 100 UNIT/ML injection Inject 10 Units into the skin 2 (two) times daily.    Yes Historical Provider, MD  insulin regular (NOVOLIN R) 100 units/mL injection Inject 20 Units into the skin 2 (two) times daily before a meal.    Yes Historical Provider, MD  ipratropium (ATROVENT) 0.06 % nasal spray Place 1 spray into both nostrils daily.  01/30/13  Yes Historical Provider, MD  losartan (COZAAR) 50 MG tablet  TAKE ONE TABLET BY MOUTH ONCE DAILY 02/05/16  Yes Jettie Booze, MD  methocarbamol (ROBAXIN) 750 MG tablet Take 750 mg by mouth 2 (two) times daily as needed for muscle spasms.   Yes Historical Provider, MD  omeprazole (PRILOSEC) 20 MG capsule Take 20 mg by mouth 2 (two) times daily before a meal.   Yes Historical Provider, MD  potassium chloride SA (K-DUR,KLOR-CON) 20 MEQ tablet Take 1.5 tablets (30 mEq total) by mouth 2 (two) times daily. TAKE 1.5 TWICE DAILY TO TOTAL 30 MEQ 11/04/15  Yes Jettie Booze, MD  warfarin (COUMADIN) 2.5 MG tablet TAKE ONE TABLET BY MOUTH AS DIRECTED Patient taking differently: Takes 2.5 mg daily except 5mg  on Wedneday. 01/11/16  Yes Jettie Booze, MD  ACCU-CHEK AVIVA PLUS test strip USE ONE STRIP TO CHECK GLUCOSE THREE TIMES DAILY AS DIRECTED 10/08/15   Elayne Snare, MD  ACCU-CHEK FASTCLIX LANCETS MISC USE ONE  TO CHECK GLUCOSE THREE TIMES DAILY 11/30/15   Elayne Snare, MD  nitroGLYCERIN (NITROSTAT) 0.4 MG SL tablet Place 1 tablet (0.4 mg total) under the tongue every 5 (five) minutes as needed for chest pain (MAX 3 TABLETS). 12/15/14   Carlena Bjornstad, MD    Family History    Family History  Problem Relation Age of Onset  . Hypertension Mother   . Stroke Mother   . Diabetes Father   . Coronary artery disease Father   . Other Father     DVT  . Diabetes Brother   . Colon cancer Neg Hx   . Heart attack Neg Hx     Social History    Social History   Social History  . Marital status: Married    Spouse name: N/A  . Number of children: N/A  . Years of education: N/A   Occupational History  . Retired- Nurse, mental health    Social History Main Topics  . Smoking status: Former Smoker    Packs/day: 3.00    Years: 39.00    Types: Cigarettes    Start date: 05/24/1956    Quit date: 03/22/1995  . Smokeless tobacco: Never Used  Comment: quit smoking 18 years ago  . Alcohol use 1.2 oz/week    2 Cans of beer per week     Comment: beer  .  Drug use: No  . Sexual activity: No   Other Topics Concern  . Not on file   Social History Narrative  . No narrative on file     Review of Systems    General:  No chills, fever, night sweats or weight changes.  Cardiovascular:  See HPI Dermatological: No rash, lesions/masses Respiratory: No cough, ++dyspnea Urologic: No hematuria, dysuria Abdominal:   No nausea, vomiting, diarrhea, bright red blood per rectum, melena, or hematemesis Neurologic:  No visual changes, wkns, changes in mental status. All other systems reviewed and are otherwise negative except as noted above.  Physical Exam    Blood pressure 127/84, pulse 70, temperature 97.4 F (36.3 C), temperature source Oral, resp. rate 14, SpO2 98 %.  General: Pleasant older WM, NAD Psych: Normal affect. Neuro: Alert and oriented X 3. Moves all extremities spontaneously. HEENT: Normal  Neck: Supple without bruits or JVD. Lungs:  Resp regular and unlabored, CTA. Heart: Irreg Irreg no s3, s4, or murmurs. Abdomen: Soft, non-tender, non-distended, BS + x 4.  Extremities: No clubbing, cyanosis 1+ LE edema R>L. DP/PT/Radials 2+ and equal bilaterally.  Labs    Troponin [facility] of Care Test)  Recent Labs  03/15/16 1324  TROPIPOC 0.02   No results for input(s): CKTOTAL, CKMB, TROPONINI in the last 72 hours. Lab Results  Component Value Date   WBC 5.7 03/15/2016   HGB 13.8 03/15/2016   HCT 43.0 03/15/2016   MCV 84.6 03/15/2016   PLT 173 03/15/2016    Recent Labs Lab 03/09/16 1320 03/15/16 1314  NA 141 143  K 4.5 4.0  CL  --  108  CO2 25 23  BUN 28.8* 30*  CREATININE 1.9* 1.61*  CALCIUM 9.2 9.1  PROT 6.5  --   BILITOT 0.88  --   ALKPHOS 97  --   ALT 10  --   AST 13  --   GLUCOSE 169* 167*   Lab Results  Component Value Date   CHOL 143 10/12/2015   HDL 38.70 (L) 10/12/2015   LDLCALC 73 10/12/2015   TRIG 157.0 (H) 10/12/2015   Lab Results  Component Value Date   DDIMER 1.62 (H) 05/05/2014       Radiology Studies    Dg Chest 2 View  Result Date: 03/15/2016 CLINICAL DATA:  Shortness of breath for 3 days, cough, EXAM: CHEST  2 VIEW COMPARISON:  02/23/2016 FINDINGS: Cardiomegaly again noted. Cardiac pacemaker leads are unchanged in position. Status post median sternotomy. Stable left basilar scarring and pleural thickening. No definite superimposed infiltrate or pulmonary edema. IMPRESSION: Status post median sternotomy. Stable left basilar scarring and pleural thickening. No definite superimposed infiltrate or pulmonary edema. Electronically Signed   By: Lahoma Crocker M.D.   On: 03/15/2016 13:33   Dg Chest 2 View  Result Date: 02/23/2016 CLINICAL DATA:  Cough, congestion, shortness of breath, former smoking history EXAM: CHEST  2 VIEW COMPARISON:  Chest x-ray of 05/08/2014 FINDINGS: Pleural and parenchymal opacity at the left lung base posteriorly noted on prior chest x-ray appears stable and therefore is most consistent with scarring and possible loculated fluid. Pleural plaque formation is also noted primarily on the left. The lungs remain somewhat hyperaerated with increased AP diameter suggesting and element of emphysema. Mild scarring at the right costophrenic angle also is noted and  there is calcification of the hemidiaphragms which probably is asbestos related. Cardiomegaly is stable and AICD lead remains. Median sternotomy sutures are noted from prior CABG. There are degenerative changes throughout the thoracic spine. IMPRESSION: 1. Probable chronic pleural and parenchymal scarring at the left lung base. 2. Stable asbestos related changes of pleural plaques and calcified hemidiaphragms. 3. Stable cardiomegaly with AICD lead. 4. Probable emphysema. Electronically Signed   By: Ivar Drape M.D.   On: 02/23/2016 17:05    ECG & Cardiac Imaging    EKG: AF  Echo: 2/16  Study Conclusions  - Left ventricle: The cavity size was normal. There was moderate concentric hypertrophy. Systolic  function was moderately reduced. The estimated ejection fraction was in the range of 35% to 40%. Moderate diffuse hypokinesis. There was a reduced contribution of atrial contraction to ventricular filling, due to increased ventricular diastolic pressure or atrial contractile dysfunction. Doppler parameters are consistent with a reversible restrictive pattern, indicative of decreased left ventricular diastolic compliance and/or increased left atrial pressure (grade 3 diastolic dysfunction). - Ventricular septum: Septal motion showed paradox. These changes are consistent with intraventricular conduction delay. - Aortic valve: Poorly visualized. Mildly calcified annulus. Probably trileaflet; mildly calcified leaflets. - Aorta: Aortic root dimension: 39 mm (ED). Ascending aorta diameter: 46 mm (ED). - Ascending aorta: The ascending aorta was mildly dilated. - Mitral valve: There was mild regurgitation. - Left atrium: The atrium was severely dilated. - Right ventricle: The cavity size was mildly dilated. Wall thickness was normal.  Assessment & Plan    75 yo male with PMH of CAD s/p CABG, AF, chronic systolic HF, IDDM, polycythemia vera who presented to ED with ongoing, progressive dyspnea.   1. Dyspnea: Reports he has been having episodes for the past 2 weeks, but now seem to be coming more often. This morning was worse when he called the office, but instructed to come to the ED for further work up. BNP is slightly elevated. CXR with no significant edema. Denies any weight gain, or worsening LE edema. Concern that his symptoms may be consistent with ACS, given he did not have chest pain prior to his CABG.  -- admit to telemetry -- cycle troponin -- Will give 80 IV lasix x1 and follow up in the morning.  -- Hold coumadin in anticipation for cath once INR <2.   2. Permanent AF: Will continue BB, but hold coumadin. Start heparin once INR <2.   3. Chronic systolic HF:  Last echo showed improved EF. Does not appear significantly volume overloaded. Will given IV lasix and follow up -- check echo  4. IDDM: Continue home meds, start SSI  5. Polycythemia Vera: Hgb is stable at 13.8 this admission. Follow CBC  6. CKD stage II: Cr actually slightly improved this admission. Follow BMET   Signed, Reino Bellis, NP-C Pager [phone] 03/15/2016, 4:42 PM As above, patient seen and examined. Briefly he is a 75 year old male with past medical history of coronary artery disease status post coronary artery bypass and graft, ischemic cardiomyopathy, prior ICD, permanent atrial fibrillation, polycythemia vera, diabetes mellitus, chronic stage III kidney disease with progressive dyspnea on exertion. Patient states that for the past 4 days he has noticed increasing dyspnea on exertion. His dyspnea can also occur at rest. There is no orthopnea, PND or pedal edema. He denies chest pain, palpitations or syncope. No fevers, chills or productive cough. He has been taking his Coumadin for his atrial fibrillation and his INR is therapeutic. Chest x-ray shows  no edema. BNP 610. Troponin 0.02. BUN 30 and creatinine 1.61. Hemoglobin 13.8. INR 2.57. Electrocardiogram shows atrial fibrillation with left bundle branch block.  1 dyspnea on exertion-etiology unclear. He does not appear to be markedly volume overloaded on examination and apparently optivol transmission did not appear to show this. Chest x-ray does not show edema. However his BNP is mildly elevated. Pulmonary embolus unlikely given chronic Coumadin use and therapeutic INR. I'm concerned about the possibility of anginal equivalent. He did not have chest pain at time of his previous infarct. We will hold his Coumadin. Cycle enzymes. I will give 1 dose of IV Lasix to see if this improves his symptoms. If not he will require cardiac catheterization. He would be at risk for contrast nephropathy and we would need to hold his diuretics  later prior to procedure. We cannot proceed until his INR is less than 1.8. If his symptoms markedly improved following IV Lasix would prefer to risk stratify with a stress nuclear study given risk for contrast nephropathy.   2 permanent atrial fibrillation-we will hold Coumadin as catheterization may be necessary. Once INR less than 2 had IV heparin. Continue carvedilol.  3 coronary artery disease-continue statin. Aspirin added given possible unstable angina.  4 chronic stage III kidney disease-patient will need close follow-up of renal function if catheterization performed.   5 ischemic cardiomyopathy-schedule echocardiogram to assess LV function. Continue ARB and beta blocker.  6 polycythemia vera-I do not think this is contributing to his dyspnea as his hemoglobin is less than 15. This is followed by hematology.  7 prior ICD.  Kirk Ruths, MD

## 2016-03-15 NOTE — ED Notes (Signed)
Wheeled pt in w/c from waiting room to E44, pt placed in gown. Waiting for provider.

## 2016-03-15 NOTE — ED Provider Notes (Signed)
[facility] DEPT Provider Note   CSN: [id number] Arrival date & time: 03/15/16  1249     History   Chief Complaint Chief Complaint  Patient presents with  . Shortness of Breath    HPI [name] is a 75 y.o. male.  HPI 75 yo M with extensive PMHx as below here with intermittent SOB. Pt states that for the past 4 days, he has had increasingly frequent episodes in which he becomes acutely SOB. He feels like he suddenly "can't get enough air" followed by generalized fatigue. He denies any associated CP but states he "never" has chest pain despite his coronary hsitory. These episodes have occurred with both rest and exertion. They are increasingly frequent over past several days, particularly worse this AM so he presents for eval. No recent med changes. Of note, he was on doxycycline several weeks ago for cough which has improved. No other med changes. He has chronic RLE>LLE swelling 2/2 an injury that is not more severe than baseline.  Past Medical History:  Diagnosis Date  . AAA (abdominal aortic aneurysm) [facility])    Surgical repair  . Adenomatous colon polyp   . Alcohol ingestion of more than four drinks per week    Excess beer  not a dependency problem  . Aortic valve sclerosis   . Arthritis   . Ascending aortic aneurysm ([place]) 05/10/2014  . Atrial fibrillation ([place])    Previous long-term amiodarone therapy with multiple cardioversions / amiodarone stopped September, 2009  . Atrial flutter [facility])    Started November, 2010, Left-sided and cannot ablate  . Bony abnormality    Patient's manubrium is slightly displaced to the right  . CAD (coronary artery disease)    Catheterization July, 2008... name and vein grafts patent but low cardiac output  . Cardiomyopathy    Ischemic... ICD  . Carotid artery disease ([place])    Doppler, December, 2013, 0-39% bilateral  . Chronotropic incompetence    IV pacing rate adjusted  . Combined systolic and diastolic CHF   . COPD (chronic  obstructive pulmonary disease) ([place])   . Diabetes mellitus   . Discolored skin   . Diverticulosis   . Drug therapy    Redness and swelling with Avelox infusion May 24, 2011  . Ejection fraction    Ejection fraction has varied over time from 35-50%.,, Echoes are technically very difficult,,, EF 50%, echo, May 25, 2011, technically very difficult  . Eye abnormality    Ophthalmologist questions a clot in one of his eyes, May, 2012  . Gout   . Hyperlipidemia   . Hypertension   . Internal hemorrhoids   . Left atrial thrombus    Remote past... cardioversions done since that time  . Left ventricular ejection fraction less than 40%   . Mitral regurgitation    Mild echo  . Myocardial infarction   . Nasal drainage    Chronic  . Overweight(278.02)    November, 2012  . Pericardial effusion   . Pericardial effusion    November, 2011 .. decreased during hospitalization  . Pilonidal cyst   . Pleural effusion    Large loculated effusion on the left side November, 2011. This was tapped. It was exudative. Cytology revealed no cancer no proof of mesothelioma area pulmonary team felt that no further workup was needed  . Pleural thickening   . Pneumonia   . Polycythemia vera ([place]) 07/28/2014  . S/P AAA repair   . S/P ICD (internal cardiac defibrillator) procedure  Dr. Lovena Le 2009... by the pacing  . SOB (shortness of breath)    Large left effusion/ thoracentesis/hospitalization/November, 2011... exudated.. cytology negative.. Dr.Wert.. no proof of mesothelioma  . Spinal cord stimulator status    October, 2013  . Spinal stenosis    Surgery Dr.Elsner  . Thrombophlebitis of superficial veins of upper extremities    Possible venous stenosis from defibrillator  . Venous insufficiency    Toe discoloration chronic  . Ventral hernia    April, 2014, result of his abdominal surgery  . Warfarin anticoagulation   . Wide-complex tachycardia [facility])     Patient Active Problem List   Diagnosis Date  Noted  . Angina pectoris ([place]) 03/15/2016  . History of gout 10/09/2015  . Chronic systolic heart failure ([place]) 02/24/2015  . Polycythemia vera ([place]) 07/28/2014  . Ascending aortic aneurysm ([place]) 05/10/2014  . Hypoxia 05/05/2014  . COPD exacerbation ([place]) 05/05/2014  . Diabetes ([place]) 05/05/2014  . Chronic systolic CHF (congestive heart failure) ([place]) 03/07/2014  . History of DVT (deep vein thrombosis) 03/07/2014  . Cardiomyopathy, ischemic 02/05/2014  . Pleural plaque without asbestosis 01/23/2014  . Elevated hemoglobin ([place]) 01/14/2014  . CKD (chronic kidney disease) stage 3, GFR 30-59 ml/min 01/14/2014  . Asbestos exposure 01/14/2014  . Shortness of breath 01/01/2014  . Ejection fraction   . Hypopotassemia 12/20/2012  . ACE-inhibitor cough 10/04/2012  . Ventral hernia   . Bony abnormality   . Carotid artery disease ([place])   . Spinal cord stimulator status   . Peripheral vascular disease with claudication ([place]) 08/31/2011  . Atrial fibrillation ([place])   . Spinal stenosis   . CAD (coronary artery disease)   . Venous insufficiency   . Mitral regurgitation   . Aortic valve sclerosis   . Alcohol ingestion of more than four drinks per week   . Chronotropic incompetence   . Thrombophlebitis of superficial veins of upper extremities   . Pericardial effusion   . Overweight(278.02)   . Pleural effusion   . Hx of allergic drug reaction   . Hyperlipidemia   . Hypertension   . Atrial flutter ([place])   . Left atrial thrombus   . AAA (abdominal aortic aneurysm) ([place])   . S/P ICD (internal cardiac defibrillator) procedure   . Warfarin anticoagulation   . Diabetic polyneuropathy ([place]) 01/07/2010  . DIVERTICULOSIS, COLON 06/10/2009  . Uncontrolled diabetes mellitus with peripheral circulatory disorder ([place]) 03/11/2009  . BACK PAIN, LUMBAR 06/12/2008  . 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 Acute gout 07/27/2006  . CORONARY ARTERY BYPASS GRAFT, HX OF 07/27/2006     Past Surgical History:  Procedure Laterality Date  . ABDOMINAL AORTIC ANEURYSM REPAIR  11/2002  . BACK SURGERY    . BI-VENTRICULAR PACEMAKER INSERTION (CRT-P)  02-11-2013   Pt with previously implanted MDT CRTD downgraded to CRTP by Dr Lovena Le 02-11-13  . COLONOSCOPY W/ POLYPECTOMY    . CORONARY ARTERY BYPASS GRAFT  2004  . IMPLANTABLE CARDIOVERTER DEFIBRILLATOR (ICD) GENERATOR CHANGE N/A 02/11/2013   Procedure: ICD GENERATOR CHANGE;  Surgeon: Evans Lance, MD;  Location: [facility] CATH LAB;  Service: Cardiovascular;  Laterality: N/A;  . INCISION AND DRAINAGE ABSCESS / HEMATOMA OF BURSA / KNEE / THIGH    . LEG SURGERY Right   . LUMBAR FUSION    . pilonidal cyst removal    . SURGERY SCROTAL / TESTICULAR         Home Medications    Prior to Admission medications  Medication Sig Start Date End Date Taking? Authorizing Provider  acetaminophen (TYLENOL) 325 MG tablet Take 325 mg by mouth every 4 (four) hours as needed for moderate pain.    Yes Historical Provider, MD  allopurinol (ZYLOPRIM) 100 MG tablet Take 2 tablets (200 mg total) by mouth daily. 10/09/15  Yes [name], MD  amLODipine (NORVASC) 5 MG tablet Take 2.5 mg by mouth daily.   Yes Historical Provider, MD  aspirin 81 MG tablet Take 81 mg by mouth once a week. Take on sundays   Yes Historical Provider, MD  atorvastatin (LIPITOR) 10 MG tablet Take 1 tablet (10 mg total) by mouth daily. 10/09/15  Yes [name], MD  carboxymethylcellulose (REFRESH PLUS) 0.5 % SOLN Place 2 drops into both eyes daily.    Yes Historical Provider, MD  carvedilol (COREG) 6.25 MG tablet Take 1 tablet (6.25 mg total) by mouth 2 (two) times daily. 08/31/15  Yes Jettie Booze, MD  celecoxib (CELEBREX) 200 MG capsule Take 200 mg by mouth daily as needed for mild pain.   Yes Historical Provider, MD  chlorthalidone (HYGROTON) 25 MG tablet Take 1 tablet by mouth daily when weight reaches 255 lbs or greater.   Yes Historical Provider, MD   Cholecalciferol 1000 units tablet Take 1,000 Units by mouth daily.   Yes Historical Provider, MD  colchicine 0.6 MG tablet Take 0.6 mg by mouth daily as needed (gout).    Yes Historical Provider, MD  furosemide (LASIX) 80 MG tablet TAKE ONE TABLET BY MOUTH TWICE DAILY 10/15/15  Yes Carlena Bjornstad, MD  gabapentin (NEURONTIN) 300 MG capsule TAKE ONE CAPSULE BY MOUTH 4 TIMES DAILY AS NEEDED FOR LEG PAIN Patient taking differently: TAKE ONE CAPSULE BY MOUTH 5 TIMES DAILY AS NEEDED FOR LEG PAIN 12/04/15  Yes Elayne Snare, MD  insulin detemir (LEVEMIR) 100 UNIT/ML injection Inject 10 Units into the skin 2 (two) times daily.    Yes Historical Provider, MD  insulin regular (NOVOLIN R) 100 units/mL injection Inject 20 Units into the skin 2 (two) times daily before a meal.    Yes Historical Provider, MD  ipratropium (ATROVENT) 0.06 % nasal spray Place 1 spray into both nostrils daily.  01/30/13  Yes Historical Provider, MD  losartan (COZAAR) 50 MG tablet TAKE ONE TABLET BY MOUTH ONCE DAILY 02/05/16  Yes Jettie Booze, MD  methocarbamol (ROBAXIN) 750 MG tablet Take 750 mg by mouth 2 (two) times daily as needed for muscle spasms.   Yes Historical Provider, MD  omeprazole (PRILOSEC) 20 MG capsule Take 20 mg by mouth 2 (two) times daily before a meal.   Yes Historical Provider, MD  potassium chloride SA (K-DUR,KLOR-CON) 20 MEQ tablet Take 1.5 tablets (30 mEq total) by mouth 2 (two) times daily. TAKE 1.5 TWICE DAILY TO TOTAL 30 MEQ 11/04/15  Yes Jettie Booze, MD  warfarin (COUMADIN) 2.5 MG tablet TAKE ONE TABLET BY MOUTH AS DIRECTED Patient taking differently: Takes 2.5 mg daily except 5mg  on Wedneday. 01/11/16  Yes Jettie Booze, MD  ACCU-CHEK AVIVA PLUS test strip USE ONE STRIP TO CHECK GLUCOSE THREE TIMES DAILY AS DIRECTED 10/08/15   Elayne Snare, MD  ACCU-CHEK FASTCLIX LANCETS MISC USE ONE  TO CHECK GLUCOSE THREE TIMES DAILY 11/30/15   Elayne Snare, MD  nitroGLYCERIN (NITROSTAT) 0.4 MG SL tablet Place  1 tablet (0.4 mg total) under the tongue every 5 (five) minutes as needed for chest pain (MAX 3 TABLETS). 12/15/14   Leonie Douglas  [name], MD    Family History Family History  Problem Relation Age of Onset  . Hypertension Mother   . Stroke Mother   . Diabetes Father   . Coronary artery disease Father   . Other Father     DVT  . Diabetes Brother   . Colon cancer Neg Hx   . Heart attack Neg Hx     Social History Social History  Substance Use Topics  . Smoking status: Former Smoker    Packs/day: 3.00    Years: 39.00    Types: Cigarettes    Start date: 05/24/1956    Quit date: 03/22/1995  . Smokeless tobacco: Never Used     Comment: quit smoking 18 years ago  . Alcohol use 1.2 oz/week    2 Cans of beer per week     Comment: beer     Allergies   Avelox [moxifloxacin hcl in nacl] and Penicillins   Review of Systems Review of Systems  Constitutional: Positive for fatigue. Negative for chills and fever.  HENT: Negative for congestion and rhinorrhea.   Eyes: Negative for visual disturbance.  Respiratory: Positive for shortness of breath. Negative for cough and wheezing.   Cardiovascular: Positive for leg swelling. Negative for chest pain.  Gastrointestinal: Negative for abdominal pain, diarrhea, nausea and vomiting.  Genitourinary: Negative for dysuria and flank pain.  Musculoskeletal: Negative for neck pain and neck stiffness.  Skin: Negative for rash and wound.  Allergic/Immunologic: Negative for immunocompromised state.  Neurological: Negative for syncope, weakness and headaches.  All other systems reviewed and are negative.    Physical Exam Updated Vital Signs BP (!) 156/117   Pulse 70   Temp 97.4 F (36.3 C) (Oral)   Resp 16   SpO2 97%   Physical Exam  Constitutional: He is oriented to person, place, and time. He appears well-developed and well-nourished. No distress.  HENT:  Head: Normocephalic and atraumatic.  Eyes: Conjunctivae are normal.  Neck: Neck supple.   Cardiovascular: Normal rate.  An irregularly irregular rhythm present. Exam reveals no friction rub.   Murmur heard.  Crescendo decrescendo systolic murmur is present with a grade of 2/6  Pulmonary/Chest: Effort normal and breath sounds normal. No respiratory distress. He has no wheezes. He has no rales.  Abdominal: Soft. Bowel sounds are normal. He exhibits no distension.  Musculoskeletal: He exhibits edema (trace, bilateral, with no calf TTP).  Neurological: He is alert and oriented to person, place, and time. He exhibits normal muscle tone.  Skin: Skin is warm. Capillary refill takes less than 2 seconds.  Psychiatric: He has a normal mood and affect.  Nursing note and vitals reviewed.    ED Treatments / Results  Labs (all labs ordered are listed, but only abnormal results are displayed) Labs Reviewed  BASIC METABOLIC PANEL - Abnormal; Notable for the following:       Result Value   Glucose, Bld 167 (*)    BUN 30 (*)    Creatinine, Ser 1.61 (*)    GFR calc non Af Amer 40 (*)    GFR calc Af Amer 47 (*)    All other components within normal limits  CBC - Abnormal; Notable for the following:    RDW 17.1 (*)    All other components within normal limits  PROTIME-INR - Abnormal; Notable for the following:    Prothrombin Time 28.1 (*)    All other components within normal limits  BRAIN NATRIURETIC PEPTIDE - Abnormal; Notable for the following:  B Natriuretic Peptide 610.8 (*)    All other components within normal limits  MAGNESIUM  PROTIME-INR  I-STAT TROPOININ, ED    EKG  EKG Interpretation  Date/Time:  Tuesday March 15 2016 13:02:00 EST Ventricular Rate:  79 PR Interval:    QRS Duration: 162 QT Interval:  452 QTC Calculation: 518 R Axis:   -109 Text Interpretation:  Atrial fibrillation Non-specific intra-ventricular conduction block Possible Lateral infarct , age undetermined Abnormal ECG Since last EKG, non-specific changes in QRS morphology and PVCs are now  present Rhythm not paced Confirmed by Clemente Dewey MD, Lysbeth Galas [phone]) on 03/15/2016 4:29:09 PM       Radiology Dg Chest 2 View  Result Date: 03/15/2016 CLINICAL DATA:  Shortness of breath for 3 days, cough, EXAM: CHEST  2 VIEW COMPARISON:  02/23/2016 FINDINGS: Cardiomegaly again noted. Cardiac pacemaker leads are unchanged in position. Status post median sternotomy. Stable left basilar scarring and pleural thickening. No definite superimposed infiltrate or pulmonary edema. IMPRESSION: Status post median sternotomy. Stable left basilar scarring and pleural thickening. No definite superimposed infiltrate or pulmonary edema. Electronically Signed   By: Lahoma Crocker M.D.   On: 03/15/2016 13:33    Procedures Procedures (including critical care time)  Medications Ordered in ED Medications  furosemide (LASIX) injection 80 mg (not administered)  allopurinol (ZYLOPRIM) tablet 200 mg (not administered)  amLODipine (NORVASC) tablet 2.5 mg (not administered)  aspirin tablet 81 mg (not administered)  atorvastatin (LIPITOR) tablet 10 mg (not administered)  carboxymethylcellulose (REFRESH PLUS) 0.5 % ophthalmic solution 2 drop (not administered)  carvedilol (COREG) tablet 6.25 mg (not administered)  celecoxib (CELEBREX) capsule 200 mg (not administered)  Cholecalciferol 1,000 Units (not administered)  gabapentin (NEURONTIN) capsule 300 mg (not administered)  insulin detemir (LEVEMIR) injection 10 Units (not administered)  ipratropium (ATROVENT) 0.06 % nasal spray 1 spray (not administered)  losartan (COZAAR) tablet 50 mg (not administered)  methocarbamol (ROBAXIN) tablet 750 mg (not administered)  pantoprazole (PROTONIX) EC tablet 40 mg (not administered)  potassium chloride (K-DUR,KLOR-CON) CR tablet 15 mEq (not administered)     Initial Impression / Assessment and Plan / ED Course  I have reviewed the triage vital signs and the nursing notes.  Pertinent labs & imaging results that were available  during my care of the patient were reviewed by me and considered in my medical decision making (see chart for details).  Clinical Course     75 yo M with extensive PMHx as above here with intermittent, increasingly frequent episodes of SOB. On arrival, pt in AFib (which is baseline), o/w HDS. No CP currently. [employer] PM interrogated - no acute abnormalities. Initial lab work as above, remarkable for mild BNP elevation but neg trop. CXR without overt edema. Given complex cardiac history, consulted Cards who will admit. Pt HDS and well appearing. Admit to Cards.  Final Clinical Impressions(s) / ED Diagnoses   Final diagnoses:  SOB (shortness of breath)  Anginal equivalent (HCC)      Duffy Bruce, MD 03/15/16 1827

## 2016-03-15 NOTE — Telephone Encounter (Signed)
Reviewed transmission with Dr. Caryl Comes. Transmission shows chronic afib. Fluid status stable. Dr. Caryl Comes recommends pt go to ED.  I spoke with pt's wife and gave her this information.

## 2016-03-15 NOTE — Telephone Encounter (Signed)
[name] is calling because [name] is extremely short of breath . Please call

## 2016-03-15 NOTE — Progress Notes (Signed)
ANTICOAGULATION CONSULT NOTE - Initial Consult  Pharmacy Consult for heparin Indication: chest pain/ACS and atrial fibrillation  Allergies  Allergen Reactions  . Avelox [Moxifloxacin Hcl In Nacl] Swelling, Rash and Other (See Comments)    Patient became hypotensive after infusion started Because of a history of documented adverse serious drug reaction;Medi Alert bracelet  is recommended  . Penicillins Anaphylaxis, Other (See Comments) and Swelling    REACTION: anaphylaxis Because of a history of documented adverse serious drug reaction;Medi Alert bracelet  is recommended    Patient Measurements:   Heparin Dosing Weight:   Vital Signs: Temp: 97.4 F (36.3 C) (12/26 1557) Temp Source: Oral (12/26 1557) BP: 127/84 (12/26 1630) Pulse Rate: 70 (12/26 1630)  Labs:  Recent Labs  03/15/16 1314  HGB 13.8  HCT 43.0  PLT 173  LABPROT 28.1*  INR 2.57  CREATININE 1.61*    Estimated Creatinine Clearance: 53.1 mL/min (by C-G formula based on SCr of 1.61 mg/dL (H)).   Medical History: Past Medical History:  Diagnosis Date  . AAA (abdominal aortic aneurysm) [facility])    Surgical repair  . Adenomatous colon polyp   . Alcohol ingestion of more than four drinks per week    Excess beer  not a dependency problem  . Aortic valve sclerosis   . Arthritis   . Ascending aortic aneurysm ([place]) 05/10/2014  . Atrial fibrillation ([place])    Previous long-term amiodarone therapy with multiple cardioversions / amiodarone stopped September, 2009  . Atrial flutter [facility])    Started November, 2010, Left-sided and cannot ablate  . Bony abnormality    Patient's manubrium is slightly displaced to the right  . CAD (coronary artery disease)    Catheterization July, 2008... name and vein grafts patent but low cardiac output  . Cardiomyopathy    Ischemic... ICD  . Carotid artery disease ([place])    Doppler, December, 2013, 0-39% bilateral  . Chronotropic incompetence    IV pacing rate adjusted  .  Combined systolic and diastolic CHF   . COPD (chronic obstructive pulmonary disease) ([place])   . Diabetes mellitus   . Discolored skin   . Diverticulosis   . Drug therapy    Redness and swelling with Avelox infusion May 24, 2011  . Ejection fraction    Ejection fraction has varied over time from 35-50%.,, Echoes are technically very difficult,,, EF 50%, echo, May 25, 2011, technically very difficult  . Eye abnormality    Ophthalmologist questions a clot in one of his eyes, May, 2012  . Gout   . Hyperlipidemia   . Hypertension   . Internal hemorrhoids   . Left atrial thrombus    Remote past... cardioversions done since that time  . Left ventricular ejection fraction less than 40%   . Mitral regurgitation    Mild echo  . Myocardial infarction   . Nasal drainage    Chronic  . Overweight(278.02)    November, 2012  . Pericardial effusion   . Pericardial effusion    November, 2011 .. decreased during hospitalization  . Pilonidal cyst   . Pleural effusion    Large loculated effusion on the left side November, 2011. This was tapped. It was exudative. Cytology revealed no cancer no proof of mesothelioma area pulmonary team felt that no further workup was needed  . Pleural thickening   . Pneumonia   . Polycythemia vera ([place]) 07/28/2014  . S/P AAA repair   . S/P ICD (internal cardiac defibrillator) procedure    Dr.  Lovena Le 2009... by the pacing  . SOB (shortness of breath)    Large left effusion/ thoracentesis/hospitalization/November, 2011... exudated.. cytology negative.. Dr.Wert.. no proof of mesothelioma  . Spinal cord stimulator status    October, 2013  . Spinal stenosis    Surgery Dr.Elsner  . Thrombophlebitis of superficial veins of upper extremities    Possible venous stenosis from defibrillator  . Venous insufficiency    Toe discoloration chronic  . Ventral hernia    April, 2014, result of his abdominal surgery  . Warfarin anticoagulation   . Wide-complex tachycardia  (HCC)     Medications:  Infusions:    Assessment: 89 yom presented to the ED with progressive dyspnea. He has a history of afib and is on chronic coumadin therapy. INR is therapeutic at 2.57. CBC is WNL and no bleeding noted. Plan to transition to IV heparin in anticipation of cardiac cath when INR is <2.   Goal of Therapy:  Heparin level 0.3-0.7 units/ml Monitor platelets by anticoagulation protocol: Yes   Plan:  Start IV heparin when INR<2 F/u cath plans  [name], [name] 03/15/2016,5:59 PM

## 2016-03-15 NOTE — Telephone Encounter (Signed)
Reviewed with Dr. Caryl Comes. He would like pt to send device transmission. I spoke with pt's wife and they will send.

## 2016-03-15 NOTE — Telephone Encounter (Signed)
I spoke with pt's wife. She reports pt has been short of breath for several days. Much worse today. Weight has been staying around 250 lbs. Weight today is 249 lbs.  Swelling in legs but this is no worse than usual. Is coughing. Was treated for bronchitis in early December and this has gotten better.  I recommended pt go to ED for evaluation but wife would like pt seen in office today.  Will review with provider in office.

## 2016-03-15 NOTE — ED Triage Notes (Signed)
Pt reports to the ED for eval of SOB x 3 days. Pt reports the SOB is with minimal exertion and at rest. Pt denies any CP. He reports a mild, non-productive cough. Pt has a significant cardiac hx including a CABG and CHG. Pt reports some right leg swelling.

## 2016-03-16 ENCOUNTER — Inpatient Hospital Stay (HOSPITAL_COMMUNITY): Payer: Medicare Other

## 2016-03-16 ENCOUNTER — Other Ambulatory Visit: Payer: Self-pay

## 2016-03-16 DIAGNOSIS — R079 Chest pain, unspecified: Secondary | ICD-10-CM

## 2016-03-16 DIAGNOSIS — I209 Angina pectoris, unspecified: Secondary | ICD-10-CM

## 2016-03-16 LAB — GLUCOSE, CAPILLARY
GLUCOSE-CAPILLARY: 73 mg/dL (ref 65–99)
Glucose-Capillary: 116 mg/dL — ABNORMAL HIGH (ref 65–99)
Glucose-Capillary: 113 mg/dL — ABNORMAL HIGH (ref 65–99)
Glucose-Capillary: 197 mg/dL — ABNORMAL HIGH (ref 65–99)

## 2016-03-16 LAB — COMPREHENSIVE METABOLIC PANEL
ALK PHOS: 80 U/L (ref 38–126)
ALT: 13 U/L — ABNORMAL LOW (ref 17–63)
ANION GAP: 10 (ref 5–15)
AST: 17 U/L (ref 15–41)
Albumin: 3.4 g/dL — ABNORMAL LOW (ref 3.5–5.0)
BUN: 28 mg/dL — ABNORMAL HIGH (ref 6–20)
CALCIUM: 9.3 mg/dL (ref 8.9–10.3)
CO2: 27 mmol/L (ref 22–32)
Chloride: 106 mmol/L (ref 101–111)
Creatinine, Ser: 1.58 mg/dL — ABNORMAL HIGH (ref 0.61–1.24)
GFR calc non Af Amer: 41 mL/min — ABNORMAL LOW (ref >60)
GFR, EST AFRICAN AMERICAN: 48 mL/min — AB (ref >60)
Glucose, Bld: 128 mg/dL — ABNORMAL HIGH (ref 65–99)
Potassium: 3.8 mmol/L (ref 3.5–5.1)
SODIUM: 143 mmol/L (ref 135–145)
TOTAL PROTEIN: 6.3 g/dL — AB (ref 6.5–8.1)
Total Bilirubin: 0.6 mg/dL (ref 0.3–1.2)

## 2016-03-16 LAB — PROTIME-INR
INR: 2.21
INR: 1.91
PROTHROMBIN TIME: 24.9 s — AB (ref 11.4–15.2)
Prothrombin Time: 22.2 seconds — ABNORMAL HIGH (ref 11.4–15.2)

## 2016-03-16 LAB — ECHOCARDIOGRAM COMPLETE
Height: 72 in
WEIGHTICAEL: 3966.4 [oz_av]

## 2016-03-16 LAB — LIPID PANEL
Cholesterol: 124 mg/dL (ref 0–200)
HDL: 41 mg/dL (ref >40)
LDL CALC: 64 mg/dL (ref 0–99)
Total CHOL/HDL Ratio: 3 RATIO
Triglycerides: 95 mg/dL (ref <150)
VLDL: 19 mg/dL (ref 0–40)

## 2016-03-16 LAB — HEMOGLOBIN A1C
HEMOGLOBIN A1C: 6.3 % — AB (ref 4.8–5.6)
Mean Plasma Glucose: 134 mg/dL

## 2016-03-16 LAB — TROPONIN I: Troponin I: 0.04 ng/mL (ref <0.03)

## 2016-03-16 LAB — OCCULT BLOOD X 1 CARD TO LAB, STOOL: Fecal Occult Bld: NEGATIVE

## 2016-03-16 LAB — HEPARIN LEVEL (UNFRACTIONATED): HEPARIN UNFRACTIONATED: 0.16 [IU]/mL — AB (ref 0.30–0.70)

## 2016-03-16 MED ORDER — HEPARIN (PORCINE) IN NACL 100-0.45 UNIT/ML-% IJ SOLN
1600.0000 [IU]/h | INTRAMUSCULAR | Status: DC
  Administered 2016-03-16: 1300 [IU]/h via INTRAVENOUS
  Administered 2016-03-17: 1700 [IU]/h via INTRAVENOUS
  Administered 2016-03-18: 1600 [IU]/h via INTRAVENOUS
  Filled 2016-03-16 (×3): qty 250

## 2016-03-16 MED ORDER — PERFLUTREN LIPID MICROSPHERE
1.0000 mL | INTRAVENOUS | Status: AC | PRN
  Administered 2016-03-16: 4 mL via INTRAVENOUS
  Filled 2016-03-16: qty 10

## 2016-03-16 NOTE — Progress Notes (Signed)
ANTICOAGULATION CONSULT NOTE - Initial Consult  Pharmacy Consult for Heparin Indication: chest pain/ACS and atrial fibrillation  Assessment: 76 yoM presented to the ED with progressive dyspnea. He has a history of Afib and is on chronic coumadin therapy. INR was therapeutic at 2.57 on admission. PTA warfarin dose is 5mg  Wednesday, 2.5mg  all other days.   CBC is WNL and no bleeding noted. Earlier today, the INR was 2.21, but as of 1300 the INR is now 1.91 and heparin will be started. Since the patient was therapeutic this morning, no heparin bolus will be given.   Goal of Therapy:  Heparin level 0.3-0.7 units/ml Monitor platelets by anticoagulation protocol: Yes   Plan:  Start heparin infusion at 1300 units/hr Check anti-Xa level in 8 hours and daily while on heparin Continue to monitor H&H and platelets   Allergies  Allergen Reactions  . Avelox [Moxifloxacin Hcl In Nacl] Swelling, Rash and Other (See Comments)    Patient became hypotensive after infusion started Because of a history of documented adverse serious drug reaction;Medi Alert bracelet  is recommended  . Penicillins Anaphylaxis, Other (See Comments) and Swelling    REACTION: anaphylaxis Because of a history of documented adverse serious drug reaction;Medi Alert bracelet  is recommended    Patient Measurements: Height: 6' (182.9 cm) Weight: 247 lb 14.4 oz (112.4 kg) (a scale) IBW/kg (Calculated) : 77.6 Heparin Dosing Weight: 102 kg  Vital Signs: Temp: 97.4 F (36.3 C) (12/27 1230) Temp Source: Oral (12/27 1230) BP: 129/69 (12/27 1230) Pulse Rate: 44 (12/27 1230)  Labs:  Recent Labs  03/15/16 1314 03/15/16 1913 03/16/16 0102 03/16/16 0721 03/16/16 1251  HGB 13.8  --   --   --   --   HCT 43.0  --   --   --   --   PLT 173  --   --   --   --   LABPROT 28.1*  --  24.9*  --  22.2*  INR 2.57  --  2.21  --  1.91  CREATININE 1.61*  --  1.58*  --   --   TROPONINI  --  0.03* 0.04* <0.03  --     Estimated  Creatinine Clearance: 53.1 mL/min (by C-G formula based on SCr of 1.58 mg/dL (H)).   Medical History: Past Medical History:  Diagnosis Date  . AAA (abdominal aortic aneurysm) [facility])    Surgical repair  . Adenomatous colon polyp   . AICD (automatic cardioverter/defibrillator) present   . Alcohol ingestion of more than four drinks per week    Excess beer  not a dependency problem  . Aortic valve sclerosis   . Arthritis   . Ascending aortic aneurysm ([place]) 05/10/2014  . Atrial fibrillation ([place])    Previous long-term amiodarone therapy with multiple cardioversions / amiodarone stopped September, 2009  . Atrial flutter [facility])    Started November, 2010, Left-sided and cannot ablate  . Bony abnormality    Patient's manubrium is slightly displaced to the right  . CAD (coronary artery disease)    Catheterization July, 2008... name and vein grafts patent but low cardiac output  . Cardiomyopathy    Ischemic... ICD  . Carotid artery disease ([place])    Doppler, December, 2013, 0-39% bilateral  . Chronotropic incompetence    IV pacing rate adjusted  . Combined systolic and diastolic CHF   . COPD (chronic obstructive pulmonary disease) ([place])   . Discolored skin   . Diverticulosis   . Drug therapy  Redness and swelling with Avelox infusion May 24, 2011  . Ejection fraction    Ejection fraction has varied over time from 35-50%.,, Echoes are technically very difficult,,, EF 50%, echo, May 25, 2011, technically very difficult  . Eye abnormality    Ophthalmologist questions a clot in one of his eyes, May, 2012  . Gout   . Hyperlipidemia   . Hypertension   . Internal hemorrhoids   . Left atrial thrombus    Remote past... cardioversions done since that time  . Left ventricular ejection fraction less than 40%   . Mitral regurgitation    Mild echo  . Myocardial infarction   . Nasal drainage    Chronic  . Overweight(278.02)    November, 2012  . Pericardial effusion   . Pleural effusion     Large loculated effusion on the left side November, 2011. This was tapped. It was exudative. Cytology revealed no cancer no proof of mesothelioma area pulmonary team felt that no further workup was needed  . Pleural thickening   . Pneumonia   . Polycythemia vera ([place]) 07/28/2014  . Presence of permanent cardiac pacemaker   . SOB (shortness of breath)    Large left effusion/ thoracentesis/hospitalization/November, 2011... exudated.. cytology negative.. Dr.Wert.. no proof of mesothelioma  . Spinal stenosis    Surgery Dr.Elsner  . Thrombophlebitis of superficial veins of upper extremities    Possible venous stenosis from defibrillator  . Type II diabetes mellitus ([place])   . Venous insufficiency    Toe discoloration chronic  . Ventral hernia    April, 2014, result of his abdominal surgery  . Warfarin anticoagulation   . Wide-complex tachycardia (HCC)     Medications:  Infusions:  . heparin      Belia Heman, PharmD PGY1 Pharmacy Resident [phone] (Pager) 03/16/2016 2:10 PM

## 2016-03-16 NOTE — Consult Note (Signed)
   [facility] CM Inpatient Consult   03/16/2016  QI [employer] 1940/04/01 [id number]  Chart was screened for eligiblity.  Patient evaluated for [place] Management services.  Patient is not eligible for [facility] Care Management services because her particular [facility] insurance is not [facility] affiliated.   This patient is Not eligible for [facility] Care Management Services.   Reason:  Not a beneficiary currently attributed to one of the [place].  Membership roster was used to verify non- eligible status by [facility] staff.  Natividad Brood, RN BSN [facility] Liaison  [phone] business mobile phone Toll free office [phone]

## 2016-03-16 NOTE — Progress Notes (Signed)
Patient Name: [name] Date of Encounter: 03/16/2016  Primary Cardiologist: Dr [facility] Problem List     Active Problems:   Angina pectoris [name]     Subjective   Still with DOE; no chest pain  Inpatient Medications    Scheduled Meds: . allopurinol  200 mg Oral Daily  . amLODipine  2.5 mg Oral Daily  . aspirin EC  81 mg Oral Daily  . atorvastatin  10 mg Oral Daily  . carvedilol  6.25 mg Oral BID  . cholecalciferol  1,000 Units Oral Daily  . insulin aspart  0-15 Units Subcutaneous TID WC  . insulin aspart  0-5 Units Subcutaneous QHS  . insulin aspart  12 Units Subcutaneous TID WC  . insulin detemir  10 Units Subcutaneous BID  . ipratropium  1 spray Each Nare Daily  . losartan  50 mg Oral Daily  . pantoprazole  40 mg Oral Daily  . polyvinyl alcohol  2 drop Both Eyes Daily  . potassium chloride SA  30 mEq Oral Daily  . sodium chloride flush  3 mL Intravenous Q12H   Continuous Infusions:  PRN Meds: sodium chloride, acetaminophen, ALPRAZolam, celecoxib, gabapentin, methocarbamol, nitroGLYCERIN, ondansetron (ZOFRAN) IV, perflutren lipid microspheres (DEFINITY) IV suspension, sodium chloride flush, zolpidem   Vital Signs    Vitals:   03/15/16 1904 03/15/16 2037 03/16/16 0122 03/16/16 0610  BP:  133/86 121/73 138/78  Pulse:  79 92 71  Resp:  18 20 18   Temp:  97.6 F (36.4 C)  97.8 F (36.6 C)  TempSrc:  Oral  Oral  SpO2: 97% 97% 97% 98%  Weight:    247 lb 14.4 oz (112.4 kg)  Height:        Intake/Output Summary (Last 24 hours) at 03/16/16 1049 Last data filed at 03/16/16 1045  Gross per 24 hour  Intake              243 ml  Output              850 ml  Net             -607 ml   Filed Weights   03/15/16 1846 03/16/16 0610  Weight: 250 lb 4.8 oz (113.5 kg) 247 lb 14.4 oz (112.4 kg)    Physical Exam    GEN: Well nourished, well developed, in no acute distress.  HEENT: Grossly normal.  Neck: Supple Cardiac: irregular Respiratory:   CTA GI: Soft, nontender, nondistended. MS: no deformity or atrophy. Skin: warm and dry, no rash. Neuro:  Strength and sensation are intact. Psych: AAOx3.  Normal affect.  Labs    CBC  Recent Labs  03/15/16 1314  WBC 5.7  HGB 13.8  HCT 43.0  MCV 84.6  PLT [id number]   Basic Metabolic Panel  Recent Labs  03/15/16 1314 03/16/16 0102  NA 143 143  K 4.0 3.8  CL 108 106  CO2 23 27  GLUCOSE 167* 128*  BUN 30* 28*  CREATININE 1.61* 1.58*  CALCIUM 9.1 9.3  MG 2.1  --    Liver Function Tests  Recent Labs  03/16/16 0102  AST 17  ALT 13*  ALKPHOS 80  BILITOT 0.6  PROT 6.3*  ALBUMIN 3.4*   Cardiac Enzymes  Recent Labs  03/15/16 1913 03/16/16 0102 03/16/16 0721  TROPONINI 0.03* 0.04* <0.03   Hemoglobin A1C  Recent Labs  03/15/16 1913  HGBA1C 6.3*   Fasting Lipid Panel  Recent Labs  03/16/16 0102  CHOL 124  HDL 41  LDLCALC 64  TRIG 95  CHOLHDL 3.0     Telemetry    Afib, rate controlled- Personally Reviewed    Radiology    Dg Chest 2 View  Result Date: 03/15/2016 CLINICAL DATA:  Shortness of breath for 3 days, cough, EXAM: CHEST  2 VIEW COMPARISON:  02/23/2016 FINDINGS: Cardiomegaly again noted. Cardiac pacemaker leads are unchanged in position. Status post median sternotomy. Stable left basilar scarring and pleural thickening. No definite superimposed infiltrate or pulmonary edema. IMPRESSION: Status post median sternotomy. Stable left basilar scarring and pleural thickening. No definite superimposed infiltrate or pulmonary edema. Electronically Signed   By: Lahoma Crocker M.D.   On: 03/15/2016 13:33    Patient Profile     75 year old male with past medical history of coronary artery disease status post coronary artery bypass and graft, ischemic cardiomyopathy, prior ICD, permanent atrial fibrillation, polycythemia vera, diabetes mellitus, chronic stage III kidney disease with progressive dyspnea on exertion.   Assessment & Plan    1 dyspnea on  exertion-etiology unclear. He does not appear to be markedly volume overloaded on examination. Chest x-ray does not show edema. However his BNP is mildly elevated. IV lasix last PM did not improve his symptoms. Pulmonary embolus unlikely given chronic Coumadin use and therapeutic INR on admission. I'm concerned about the possibility of anginal equivalent. Coumadin on hold; begin heparin when INR < 2. Proceed with R and L cath when INR < 1.8 (risks and benefits including CVA, death, MI and contrast nephropathy discussed and pt agrees to proceed). Cath likely Friday.  2 permanent atrial fibrillation-we will hold Coumadin as catheterization necessary. Once INR less than 2 had IV heparin. Continue carvedilol.  3 coronary artery disease-continue statin. Aspirin added given possible unstable angina.  4 chronic stage III kidney disease-patient will need close follow-up of renal function following catheterization. Will hold lasix and KCL prior to cath.  5 ischemic cardiomyopathy-await echocardiogram to assess LV function. Continue ARB and beta blocker.  6 polycythemia vera-I do not think this is contributing to his dyspnea as his hemoglobin is less than 15. This is followed by hematology.  7 prior ICD.  Signed, Kirk Ruths, MD  03/16/2016, 10:49 AM

## 2016-03-16 NOTE — Progress Notes (Signed)
Echocardiogram 2D Echocardiogram has been performed with definity.  Aggie Cosier 03/16/2016, 10:41 AM

## 2016-03-16 NOTE — Progress Notes (Signed)
ANTICOAGULATION CONSULT NOTE - Initial Consult  Pharmacy Consult for Heparin Indication: chest pain/ACS and atrial fibrillation  Assessment: 63 yoM presented to the ED with progressive dyspnea. He has a history of afib and is on chronic coumadin therapy. INR was therapeutic at 2.57 on admission. PTA warfarin dose is 5mg  Wednesday, 2.5mg  all other days. CBC is WNL and no bleeding noted. Today's INR is 2.21; plan to transition to IV heparin in anticipation of cardiac cath when INR is <2.   Goal of Therapy:  Heparin level 0.3-0.7 units/ml Monitor platelets by anticoagulation protocol: Yes   Plan:  Check INR at 1300 today Start IV heparin when INR<2 F/u cath plans  Allergies  Allergen Reactions  . Avelox [Moxifloxacin Hcl In Nacl] Swelling, Rash and Other (See Comments)    Patient became hypotensive after infusion started Because of a history of documented adverse serious drug reaction;Medi Alert bracelet  is recommended  . Penicillins Anaphylaxis, Other (See Comments) and Swelling    REACTION: anaphylaxis Because of a history of documented adverse serious drug reaction;Medi Alert bracelet  is recommended    Patient Measurements: Height: 6' (182.9 cm) Weight: 247 lb 14.4 oz (112.4 kg) (a scale) IBW/kg (Calculated) : 77.6 Heparin Dosing Weight:   Vital Signs: Temp: 97.8 F (36.6 C) (12/27 0610) Temp Source: Oral (12/27 0610) BP: 138/78 (12/27 0610) Pulse Rate: 71 (12/27 0610)  Labs:  Recent Labs  03/15/16 1314 03/15/16 1913 03/16/16 0102  HGB 13.8  --   --   HCT 43.0  --   --   PLT 173  --   --   LABPROT 28.1*  --  24.9*  INR 2.57  --  2.21  CREATININE 1.61*  --  1.58*  TROPONINI  --  0.03* 0.04*    Estimated Creatinine Clearance: 53.1 mL/min (by C-G formula based on SCr of 1.58 mg/dL (H)).   Medical History: Past Medical History:  Diagnosis Date  . AAA (abdominal aortic aneurysm) [facility])    Surgical repair  . Adenomatous colon polyp   . AICD (automatic  cardioverter/defibrillator) present   . Alcohol ingestion of more than four drinks per week    Excess beer  not a dependency problem  . Aortic valve sclerosis   . Arthritis   . Ascending aortic aneurysm ([place]) 05/10/2014  . Atrial fibrillation ([place])    Previous long-term amiodarone therapy with multiple cardioversions / amiodarone stopped September, 2009  . Atrial flutter [facility])    Started November, 2010, Left-sided and cannot ablate  . Bony abnormality    Patient's manubrium is slightly displaced to the right  . CAD (coronary artery disease)    Catheterization July, 2008... name and vein grafts patent but low cardiac output  . Cardiomyopathy    Ischemic... ICD  . Carotid artery disease ([place])    Doppler, December, 2013, 0-39% bilateral  . Chronotropic incompetence    IV pacing rate adjusted  . Combined systolic and diastolic CHF   . COPD (chronic obstructive pulmonary disease) ([place])   . Discolored skin   . Diverticulosis   . Drug therapy    Redness and swelling with Avelox infusion May 24, 2011  . Ejection fraction    Ejection fraction has varied over time from 35-50%.,, Echoes are technically very difficult,,, EF 50%, echo, May 25, 2011, technically very difficult  . Eye abnormality    Ophthalmologist questions a clot in one of his eyes, May, 2012  . Gout   . Hyperlipidemia   . Hypertension   .  Internal hemorrhoids   . Left atrial thrombus    Remote past... cardioversions done since that time  . Left ventricular ejection fraction less than 40%   . Mitral regurgitation    Mild echo  . Myocardial infarction   . Nasal drainage    Chronic  . Overweight(278.02)    November, 2012  . Pericardial effusion   . Pleural effusion    Large loculated effusion on the left side November, 2011. This was tapped. It was exudative. Cytology revealed no cancer no proof of mesothelioma area pulmonary team felt that no further workup was needed  . Pleural thickening   . Pneumonia   .  Polycythemia vera ([place]) 07/28/2014  . Presence of permanent cardiac pacemaker   . SOB (shortness of breath)    Large left effusion/ thoracentesis/hospitalization/November, 2011... exudated.. cytology negative.. Dr.Wert.. no proof of mesothelioma  . Spinal stenosis    Surgery Dr.Elsner  . Thrombophlebitis of superficial veins of upper extremities    Possible venous stenosis from defibrillator  . Type II diabetes mellitus ([place])   . Venous insufficiency    Toe discoloration chronic  . Ventral hernia    April, 2014, result of his abdominal surgery  . Warfarin anticoagulation   . Wide-complex tachycardia ([place])     Medications:  Infusions:    Belia Heman, PharmD PGY1 Pharmacy Resident [phone] (Pager) 03/16/2016 8:29 AM

## 2016-03-16 NOTE — Progress Notes (Signed)
ANTICOAGULATION CONSULT NOTE - Follow-up Consult  Pharmacy Consult for Heparin Indication: chest pain/ACS and atrial fibrillation  Allergies  Allergen Reactions  . Avelox [Moxifloxacin Hcl In Nacl] Swelling, Rash and Other (See Comments)    Patient became hypotensive after infusion started Because of a history of documented adverse serious drug reaction;Medi Alert bracelet  is recommended  . Penicillins Anaphylaxis, Other (See Comments) and Swelling    REACTION: anaphylaxis Because of a history of documented adverse serious drug reaction;Medi Alert bracelet  is recommended    Patient Measurements: Height: 6' (182.9 cm) Weight: 247 lb 14.4 oz (112.4 kg) (a scale) IBW/kg (Calculated) : 77.6 Heparin Dosing Weight: 102 kg  Vital Signs: Temp: 97.5 F (36.4 C) (12/27 2014) Temp Source: Oral (12/27 2014) BP: 130/81 (12/27 2014) Pulse Rate: 89 (12/27 2014)  Labs:  Recent Labs  03/15/16 1314 03/15/16 1913 03/16/16 0102 03/16/16 0721 03/16/16 1251 03/16/16 2254  HGB 13.8  --   --   --   --   --   HCT 43.0  --   --   --   --   --   PLT 173  --   --   --   --   --   LABPROT 28.1*  --  24.9*  --  22.2*  --   INR 2.57  --  2.21  --  1.91  --   HEPARINUNFRC  --   --   --   --   --  0.16*  CREATININE 1.61*  --  1.58*  --   --   --   TROPONINI  --  0.03* 0.04* <0.03  --   --     Estimated Creatinine Clearance: 53.1 mL/min (by C-G formula based on SCr of 1.58 mg/dL (H)).   Medications:  Infusions:  . heparin 1,300 Units/hr (03/16/16 1524)    Assessment: 31 yoM presented to the ED with progressive dyspnea. He has a history of Afib and is on chronic coumadin therapy. INR was therapeutic at 2.57 on admission. PTA warfarin dose is 5mg  Wednesday, 2.5mg  all other days.   CBC is WNL and no bleeding noted. INR down to 1.91 and heparin bridge started. Heparin level subtherapeutic (0.16) on gtt at 1300 units/hr. No issues with line or bleeding reported per RN.  Goal of Therapy:   Heparin level 0.3-0.7 units/ml Monitor platelets by anticoagulation protocol: Yes   Plan:  Increase heparin infusion to 1700 units/hr F/u heparin level in 8 hours  Sherlon Handing, PharmD, BCPS Clinical pharmacist, pager [phone] 03/16/2016 11:52 PM

## 2016-03-17 ENCOUNTER — Other Ambulatory Visit: Payer: Self-pay

## 2016-03-17 DIAGNOSIS — I5022 Chronic systolic (congestive) heart failure: Secondary | ICD-10-CM

## 2016-03-17 DIAGNOSIS — I482 Chronic atrial fibrillation: Secondary | ICD-10-CM

## 2016-03-17 DIAGNOSIS — I255 Ischemic cardiomyopathy: Secondary | ICD-10-CM

## 2016-03-17 LAB — HEPARIN LEVEL (UNFRACTIONATED)
HEPARIN UNFRACTIONATED: 0.47 [IU]/mL (ref 0.30–0.70)
Heparin Unfractionated: 0.63 IU/mL (ref 0.30–0.70)

## 2016-03-17 LAB — GLUCOSE, CAPILLARY
GLUCOSE-CAPILLARY: 111 mg/dL — AB (ref 65–99)
Glucose-Capillary: 100 mg/dL — ABNORMAL HIGH (ref 65–99)
Glucose-Capillary: 135 mg/dL — ABNORMAL HIGH (ref 65–99)
Glucose-Capillary: 117 mg/dL — ABNORMAL HIGH (ref 65–99)

## 2016-03-17 LAB — BASIC METABOLIC PANEL
Anion gap: 12 (ref 5–15)
BUN: 28 mg/dL — AB (ref 6–20)
CALCIUM: 8.9 mg/dL (ref 8.9–10.3)
CO2: 22 mmol/L (ref 22–32)
CREATININE: 1.61 mg/dL — AB (ref 0.61–1.24)
Chloride: 105 mmol/L (ref 101–111)
GFR calc Af Amer: 47 mL/min — ABNORMAL LOW (ref >60)
GFR, EST NON AFRICAN AMERICAN: 40 mL/min — AB (ref >60)
Glucose, Bld: 104 mg/dL — ABNORMAL HIGH (ref 65–99)
Potassium: 3.3 mmol/L — ABNORMAL LOW (ref 3.5–5.1)
SODIUM: 139 mmol/L (ref 135–145)

## 2016-03-17 LAB — PROTIME-INR
INR: 2.05
Prothrombin Time: 23.4 seconds — ABNORMAL HIGH (ref 11.4–15.2)

## 2016-03-17 MED ORDER — ASPIRIN EC 81 MG PO TBEC
81.0000 mg | DELAYED_RELEASE_TABLET | Freq: Every day | ORAL | Status: DC
  Administered 2016-03-19 – 2016-03-23 (×5): 81 mg via ORAL
  Filled 2016-03-17 (×5): qty 1

## 2016-03-17 MED ORDER — SODIUM CHLORIDE 0.9 % IV SOLN
INTRAVENOUS | Status: DC
  Administered 2016-03-18: 06:00:00 via INTRAVENOUS

## 2016-03-17 MED ORDER — SODIUM CHLORIDE 0.9 % IV SOLN: 250.0000 mL | INTRAVENOUS | Status: DC | PRN

## 2016-03-17 MED ORDER — SODIUM CHLORIDE 0.9% FLUSH: 3.0000 mL | Freq: Two times a day (BID) | INTRAVENOUS | Status: DC

## 2016-03-17 MED ORDER — ASPIRIN 81 MG PO CHEW
81.0000 mg | CHEWABLE_TABLET | ORAL | Status: AC
  Administered 2016-03-18: 81 mg via ORAL
  Filled 2016-03-17: qty 1

## 2016-03-17 MED ORDER — SODIUM CHLORIDE 0.9% FLUSH: 3.0000 mL | INTRAVENOUS | Status: DC | PRN

## 2016-03-17 MED ORDER — INSULIN ASPART 100 UNIT/ML ~~LOC~~ SOLN
12.0000 [IU] | Freq: Three times a day (TID) | SUBCUTANEOUS | Status: DC
  Administered 2016-03-17 – 2016-03-23 (×6): 12 [IU] via SUBCUTANEOUS

## 2016-03-17 NOTE — Progress Notes (Signed)
Patient Name: [name] Date of Encounter: 03/17/2016  Primary Cardiologist: Dr. Marinda Elk Problem List     Principal Problem:   Angina pectoris [facility]) Active Problems:   S/P ICD (internal cardiac defibrillator) procedure   Atrial fibrillation (HCC)   Cardiomyopathy, ischemic   Chronic systolic heart failure (HCC)    Subjective   No chest discomfort. Still has dyspnea with exertion. Anxiously awaiting for his INR to decline so he can go for cath.   Inpatient Medications    Scheduled Meds: . allopurinol  200 mg Oral Daily  . amLODipine  2.5 mg Oral Daily  . aspirin EC  81 mg Oral Daily  . atorvastatin  10 mg Oral Daily  . carvedilol  6.25 mg Oral BID  . cholecalciferol  1,000 Units Oral Daily  . insulin aspart  0-15 Units Subcutaneous TID WC  . insulin aspart  0-5 Units Subcutaneous QHS  . insulin aspart  12 Units Subcutaneous TID WC  . insulin detemir  10 Units Subcutaneous BID  . ipratropium  1 spray Each Nare Daily  . losartan  50 mg Oral Daily  . pantoprazole  40 mg Oral Daily  . polyvinyl alcohol  2 drop Both Eyes Daily  . sodium chloride flush  3 mL Intravenous Q12H   Continuous Infusions: . heparin 1,700 Units/hr (03/17/16 1023)   PRN Meds: sodium chloride, acetaminophen, ALPRAZolam, celecoxib, gabapentin, methocarbamol, nitroGLYCERIN, ondansetron (ZOFRAN) IV, sodium chloride flush, zolpidem   Vital Signs    Vitals:   03/16/16 1230 03/16/16 2014 03/17/16 0033 03/17/16 0513  BP: 129/69 130/81 (!) 105/49 (!) 141/81  Pulse: (!) 44 89 71 (!) 53  Resp: 18 20 20 18   Temp: 97.4 F (36.3 C) 97.5 F (36.4 C) 97.9 F (36.6 C) 97.7 F (36.5 C)  TempSrc: Oral Oral Oral Oral  SpO2: 97% 98% 95% 98%  Weight:    252 lb 3.3 oz (114.4 kg)  Height:        Intake/Output Summary (Last 24 hours) at 03/17/16 1115 Last data filed at 03/17/16 [id number]  Gross per 24 hour  Intake          1169.48 ml  Output             1025 ml  Net           144.48 ml    Filed Weights   03/15/16 1846 03/16/16 0610 03/17/16 0513  Weight: 250 lb 4.8 oz (113.5 kg) 247 lb 14.4 oz (112.4 kg) 252 lb 3.3 oz (114.4 kg)    Physical Exam   GEN: Well nourished, well developed Caucasian male appearing in no acute distress.  HEENT: Grossly normal.  Neck: Supple, no JVD, carotid bruits, or masses. Cardiac: Irregularly irregular, no murmurs, rubs, or gallops. No clubbing or cyanosis. Trace lower extremity edema.  Radials/DP/PT 2+ and equal bilaterally.  Respiratory:  Respirations regular and unlabored, clear to auscultation bilaterally. GI: Soft, nontender, nondistended, BS + x 4. MS: no deformity or atrophy. Skin: warm and dry, no rash. Neuro:  Strength and sensation are intact. Psych: AAOx3.  Normal affect.  Labs    CBC  Recent Labs  03/15/16 1314  WBC 5.7  HGB 13.8  HCT 43.0  MCV 84.6  PLT [id number]   Basic Metabolic Panel  Recent Labs  03/15/16 1314 03/16/16 0102 03/17/16 0451  NA 143 143 139  K 4.0 3.8 3.3*  CL 108 106 105  CO2 23 27 22   GLUCOSE 167* 128* 104*  BUN 30* 28* 28*  CREATININE 1.61* 1.58* 1.61*  CALCIUM 9.1 9.3 8.9  MG 2.1  --   --    Liver Function Tests  Recent Labs  03/16/16 0102  AST 17  ALT 13*  ALKPHOS 80  BILITOT 0.6  PROT 6.3*  ALBUMIN 3.4*   No results for input(s): LIPASE, AMYLASE in the last 72 hours. Cardiac Enzymes  Recent Labs  03/15/16 1913 03/16/16 0102 03/16/16 0721  TROPONINI 0.03* 0.04* <0.03   BNP Invalid input(s): POCBNP D-Dimer No results for input(s): DDIMER in the last 72 hours. Hemoglobin A1C  Recent Labs  03/15/16 1913  HGBA1C 6.3*   Fasting Lipid Panel  Recent Labs  03/16/16 0102  CHOL 124  HDL 41  LDLCALC 64  TRIG 95  CHOLHDL 3.0   Thyroid Function Tests No results for input(s): TSH, T4TOTAL, T3FREE, THYROIDAB in the last 72 hours.  Invalid input(s): FREET3  Telemetry    Atrial fibrillation, HR in 60's - 80's. V-paced at times.  - Personally Reviewed  ECG     Atrial fibrillation, HR 75, with frequent PVC's. - Personally Reviewed  Radiology    Dg Chest 2 View  Result Date: 03/15/2016 CLINICAL DATA:  Shortness of breath for 3 days, cough, EXAM: CHEST  2 VIEW COMPARISON:  02/23/2016 FINDINGS: Cardiomegaly again noted. Cardiac pacemaker leads are unchanged in position. Status post median sternotomy. Stable left basilar scarring and pleural thickening. No definite superimposed infiltrate or pulmonary edema. IMPRESSION: Status post median sternotomy. Stable left basilar scarring and pleural thickening. No definite superimposed infiltrate or pulmonary edema. Electronically Signed   By: Lahoma Crocker M.D.   On: 03/15/2016 13:33    Cardiac Studies   Echocardiogram: 03/16/2016 Study Conclusions  - Left ventricle: The cavity size was mildly dilated. There was   moderate concentric hypertrophy. Systolic function was moderately   reduced. The estimated ejection fraction was in the range of 35%   to 40%. Diffuse hypokinesis. Doppler parameters are consistent   with restrictive physiology, indicative of decreased left   ventricular diastolic compliance and/or increased left atrial   pressure. Doppler parameters are consistent with elevated   ventricular end-diastolic filling pressure. - Ventricular septum: Septal motion showed paradox. - Aortic valve: Trileaflet; normal thickness leaflets.   Transvalvular velocity was within the normal range. There was no   stenosis. There was trivial regurgitation. - Aortic root: The aortic root was mildly dilated measuring 41 mm. - Ascending aorta: The ascending aorta was normal in size. - Mitral valve: There was mild regurgitation. - Left atrium: The atrium was severely dilated. - Right ventricle: Pacer wire or catheter noted in right ventricle. - Right atrium: The atrium was moderately dilated. Pacer wire or   catheter noted in right atrium. - Tricuspid valve: There was trivial regurgitation. - Pulmonary  arteries: Systolic pressure was within the normal   range. - Inferior vena cava: The vessel was normal in size. - Pericardium, extracardiac: There was no pericardial effusion.  Impressions:  - There is no significant difference since the prior study on   05/08/14. LVEF remains moderately decreased with diffuse   hypokinesis and paradoxical septal motion.   Patient Profile     75 yo male with PMH of CAD (s/p CABG), AF, chronic systolic CHF (EF [id number] by echo in 04/2014), IDDM, polycythemia vera, Stage 3 CKD and PPM/ICD placement who presented to [facility] ED on 12/26 with ongoing, progressive dyspnea. Echo shows EF of 35-40%, consistent with prior imaging in 2016.  Coumadin on hold for cath.    Assessment & Plan    1. Dyspnea on Exertion - reports he has been having worsening episodes of dyspnea with exertion for the past 2 weeks, but now seems to be coming more often. BNP slightly elevated at 610 and CXR on admission with no significant edema.  - there is concern this is most consistent with his previous unstable angina.  - cyclic troponin values have been flat at 0.03 and 0.04. - plan is for a cardiac catheterization for definitive evaluation once INR < 1.8, possibly tomorrow pending INR. At 2.05 this AM, being bridged with Heparin as INR was 1.91 yesterday. Will tentatively add to the cath board for tomorrow afternoon pending INR results.    2. Permanent AF - This patients CHA2DS2-VASc Score and unadjusted Ischemic Stroke Rate (% per year) is equal to 7.2 % stroke rate/year from a score of 5 (CHF, HTN, DM, Vascular, Age). Coumadin on hold in preparation for cath.  - continue BB for rate-control.   3. CAD - s/p CABG - continue ASA and statin therapy. Repeat ischemic evaluation as above.   4. Chronic systolic HF - EF 123456 by echo in 04/2014. Repeat echo this admission shows EF of 35-40% with diffuse HK.  - continue Coreg and Losartan.   5. IDDM - continue Levemir and SSI.    6. Polycythemia Vera - Hgb stable at 13.8 this AM.   7. CKD stage II - baseline from earlier this year was 1.8- 1.9. Improved on admission to 1.61 and stable at this value today.   Signed, Erma Heritage, PA  03/17/2016, 11:15 AM   The patient has been seen in conjunction with Bernerd Pho, PA. All aspects of care have been considered and discussed. The patient has been personally interviewed, examined, and all clinical data has been reviewed.   Agree with plans to perform right and left heart cath with coronary and bypass graft angiography. Hopefully this will explain the etiology for the patient's dyspnea.  If cath is unrevealing, need to consider primary pulmonary cause including the remote possibility of recurrent pulmonary emboli.  INR today is 2.0. Hopefully, we will be less than 1.7 tomorrow and heart cath via the radial brachial approach will be possible.

## 2016-03-17 NOTE — Progress Notes (Signed)
ANTICOAGULATION CONSULT NOTE - Follow-up Consult  Pharmacy Consult for Heparin Indication: chest pain/ACS and atrial fibrillation  Allergies  Allergen Reactions  . Avelox [Moxifloxacin Hcl In Nacl] Swelling, Rash and Other (See Comments)    Patient became hypotensive after infusion started Because of a history of documented adverse serious drug reaction;Medi Alert bracelet  is recommended  . Penicillins Anaphylaxis, Other (See Comments) and Swelling    REACTION: anaphylaxis Because of a history of documented adverse serious drug reaction;Medi Alert bracelet  is recommended    Patient Measurements: Height: 6' (182.9 cm) Weight: 252 lb 3.3 oz (114.4 kg) IBW/kg (Calculated) : 77.6 Heparin Dosing Weight: 102 kg  Vital Signs: Temp: 97.7 F (36.5 C) (12/28 0513) Temp Source: Oral (12/28 0513) BP: 141/81 (12/28 0513) Pulse Rate: 53 (12/28 0513)  Labs:  Recent Labs  03/15/16 1314 03/15/16 1913 03/16/16 0102 03/16/16 0721 03/16/16 1251 03/16/16 2254 03/17/16 0451 03/17/16 0738  HGB 13.8  --   --   --   --   --   --   --   HCT 43.0  --   --   --   --   --   --   --   PLT 173  --   --   --   --   --   --   --   LABPROT 28.1*  --  24.9*  --  22.2*  --  23.4*  --   INR 2.57  --  2.21  --  1.91  --  2.05  --   HEPARINUNFRC  --   --   --   --   --  0.16*  --  0.47  CREATININE 1.61*  --  1.58*  --   --   --  1.61*  --   TROPONINI  --  0.03* 0.04* <0.03  --   --   --   --     Estimated Creatinine Clearance: 52.6 mL/min (by C-G formula based on SCr of 1.61 mg/dL (H)).   Medications:  Infusions:  . heparin 1,700 Units/hr (03/17/16 0026)    Assessment: 58 yoM presented to the ED with progressive dyspnea. He has a history of Afib and is on chronic coumadin therapy. INR was therapeutic at 2.57 on admission. PTA warfarin dose is 5mg  Wednesday, 2.5mg  all other days. Heparin started when INR dropped <2.0 on 12/27 -CBC wnl, no bleeding noted -HL therapeutic 0.47 -12/28 INR 2.05  (slightly up from yesterday) -PTA warfarin: 5mg  Wed, 2.5mg  aod  Goal of Therapy:  Heparin level 0.3-0.7 units/ml Monitor platelets by anticoagulation protocol: Yes   Plan:  -Continue Heparin 1700 units/hr -Monitor daily HL, CBC, s/sx's bleeding -[facility] at [facility] [place]), PharmD  [id number] Pharmacy Resident Pager: [phone] 03/17/2016 8:35 AM

## 2016-03-17 NOTE — Progress Notes (Signed)
ANTICOAGULATION CONSULT NOTE - Follow Up Consult  Pharmacy Consult for heparin Indication: chest pain/ACS and atrial fibrillation  Allergies  Allergen Reactions  . Avelox [Moxifloxacin Hcl In Nacl] Swelling, Rash and Other (See Comments)    Patient became hypotensive after infusion started Because of a history of documented adverse serious drug reaction;Medi Alert bracelet  is recommended  . Penicillins Anaphylaxis, Other (See Comments) and Swelling    REACTION: anaphylaxis Because of a history of documented adverse serious drug reaction;Medi Alert bracelet  is recommended    Patient Measurements: Height: 6' (182.9 cm) Weight: 252 lb 3.3 oz (114.4 kg) IBW/kg (Calculated) : 77.6 Heparin Dosing Weight: 102  Vital Signs: Temp: 97.9 F (36.6 C) (12/28 1221) Temp Source: Oral (12/28 1221) BP: 130/87 (12/28 1221) Pulse Rate: 93 (12/28 1221)  Labs:  Recent Labs  03/15/16 1314 03/15/16 1913 03/16/16 0102 03/16/16 0721 03/16/16 1251 03/16/16 2254 03/17/16 0451 03/17/16 0738 03/17/16 1543  HGB 13.8  --   --   --   --   --   --   --   --   HCT 43.0  --   --   --   --   --   --   --   --   PLT 173  --   --   --   --   --   --   --   --   LABPROT 28.1*  --  24.9*  --  22.2*  --  23.4*  --   --   INR 2.57  --  2.21  --  1.91  --  2.05  --   --   HEPARINUNFRC  --   --   --   --   --  0.16*  --  0.47 0.63  CREATININE 1.61*  --  1.58*  --   --   --  1.61*  --   --   TROPONINI  --  0.03* 0.04* <0.03  --   --   --   --   --     Assessment: 75yo male with AFib.  Heparin level therapeutic but continues to climb following rate adjustment last night.  No bleeding noted.  Goal of Therapy:  Heparin level 0.3-0.7 units/ml Monitor platelets by anticoagulation protocol: Yes   Plan:  Decrease heparin to 1600 units/hr F/U AM labs Watch for s/s of bleeding   Gracy Bruins, PharmD [facility]

## 2016-03-18 ENCOUNTER — Encounter: Payer: Self-pay | Admitting: Internal Medicine

## 2016-03-18 ENCOUNTER — Encounter (HOSPITAL_COMMUNITY)
Admission: EM | Disposition: A | Discharge status: Home / Self Care | Payer: Self-pay | Source: Home / Self Care | Attending: Cardiology

## 2016-03-18 DIAGNOSIS — I2510 Atherosclerotic heart disease of native coronary artery without angina pectoris: Secondary | ICD-10-CM

## 2016-03-18 HISTORY — PX: CARDIAC CATHETERIZATION: SHX172

## 2016-03-18 LAB — BASIC METABOLIC PANEL
ANION GAP: 10 (ref 5–15)
BUN: 23 mg/dL — ABNORMAL HIGH (ref 6–20)
CALCIUM: 8.9 mg/dL (ref 8.9–10.3)
CO2: 23 mmol/L (ref 22–32)
Chloride: 106 mmol/L (ref 101–111)
Creatinine, Ser: 1.49 mg/dL — ABNORMAL HIGH (ref 0.61–1.24)
GFR, EST AFRICAN AMERICAN: 52 mL/min — AB (ref >60)
GFR, EST NON AFRICAN AMERICAN: 44 mL/min — AB (ref >60)
Glucose, Bld: 90 mg/dL (ref 65–99)
Potassium: 3.2 mmol/L — ABNORMAL LOW (ref 3.5–5.1)
Sodium: 139 mmol/L (ref 135–145)

## 2016-03-18 LAB — POCT I-STAT 3, ART BLOOD GAS (G3+)
ACID-BASE DEFICIT: 3 mmol/L — AB (ref 0.0–2.0)
BICARBONATE: 22.6 mmol/L (ref 20.0–28.0)
O2 SAT: 96 %
PO2 ART: 84 mmHg (ref 83.0–108.0)
TCO2: 24 mmol/L (ref 0–100)
pCO2 arterial: 41.9 mmHg (ref 32.0–48.0)
pH, Arterial: 7.34 — ABNORMAL LOW (ref 7.350–7.450)

## 2016-03-18 LAB — POCT I-STAT 3, VENOUS BLOOD GAS (G3P V)
Acid-base deficit: 3 mmol/L — ABNORMAL HIGH (ref 0.0–2.0)
BICARBONATE: 22.8 mmol/L (ref 20.0–28.0)
O2 SAT: 61 %
PH VEN: 7.328 (ref 7.250–7.430)
TCO2: 24 mmol/L (ref 0–100)
pCO2, Ven: 43.4 mmHg — ABNORMAL LOW (ref 44.0–60.0)
pO2, Ven: 34 mmHg (ref 32.0–45.0)

## 2016-03-18 LAB — GLUCOSE, CAPILLARY
GLUCOSE-CAPILLARY: 92 mg/dL (ref 65–99)
GLUCOSE-CAPILLARY: 79 mg/dL (ref 65–99)
Glucose-Capillary: 97 mg/dL (ref 65–99)
Glucose-Capillary: 78 mg/dL (ref 65–99)
Glucose-Capillary: 91 mg/dL (ref 65–99)

## 2016-03-18 LAB — HEPARIN LEVEL (UNFRACTIONATED): HEPARIN UNFRACTIONATED: 0.54 [IU]/mL (ref 0.30–0.70)

## 2016-03-18 LAB — PROTIME-INR
INR: 1.73
PROTHROMBIN TIME: 20.5 s — AB (ref 11.4–15.2)

## 2016-03-18 LAB — CBC
HCT: 41.1 % (ref 39.0–52.0)
HEMOGLOBIN: 13.2 g/dL (ref 13.0–17.0)
MCH: 26.7 pg (ref 26.0–34.0)
MCHC: 32.1 g/dL (ref 30.0–36.0)
MCV: 83.2 fL (ref 78.0–100.0)
Platelets: 170 10*3/uL (ref 150–400)
RBC: 4.94 MIL/uL (ref 4.22–5.81)
RDW: 16.8 % — ABNORMAL HIGH (ref 11.5–15.5)
WBC: 5.4 10*3/uL (ref 4.0–10.5)

## 2016-03-18 LAB — POCT ACTIVATED CLOTTING TIME
ACTIVATED CLOTTING TIME: 169 s
Activated Clotting Time: 180 seconds

## 2016-03-18 LAB — HM DIABETES EYE EXAM

## 2016-03-18 SURGERY — RIGHT/LEFT HEART CATH AND CORONARY/GRAFT ANGIOGRAPHY

## 2016-03-18 MED ORDER — MIDAZOLAM HCL 2 MG/2ML IJ SOLN
INTRAMUSCULAR | Status: DC | PRN
  Administered 2016-03-18: 1 mg via INTRAVENOUS

## 2016-03-18 MED ORDER — SODIUM CHLORIDE 0.9 % IV SOLN: 250.0000 mL | INTRAVENOUS | Status: DC | PRN

## 2016-03-18 MED ORDER — FENTANYL CITRATE (PF) 100 MCG/2ML IJ SOLN
INTRAMUSCULAR | Status: AC
  Filled 2016-03-18: qty 2

## 2016-03-18 MED ORDER — HEPARIN SODIUM (PORCINE) 1000 UNIT/ML IJ SOLN
INTRAMUSCULAR | Status: DC | PRN
  Administered 2016-03-18: 5000 [IU] via INTRAVENOUS

## 2016-03-18 MED ORDER — HEPARIN SODIUM (PORCINE) 1000 UNIT/ML IJ SOLN
INTRAMUSCULAR | Status: AC
  Filled 2016-03-18: qty 1

## 2016-03-18 MED ORDER — IOPAMIDOL (ISOVUE-370) INJECTION 76%
INTRAVENOUS | Status: AC
  Filled 2016-03-18: qty 125

## 2016-03-18 MED ORDER — MIDAZOLAM HCL 2 MG/2ML IJ SOLN
INTRAMUSCULAR | Status: AC
  Filled 2016-03-18: qty 2

## 2016-03-18 MED ORDER — IOPAMIDOL (ISOVUE-370) INJECTION 76%
INTRAVENOUS | Status: DC | PRN
  Administered 2016-03-18: 125 mL via INTRA_ARTERIAL

## 2016-03-18 MED ORDER — IOPAMIDOL (ISOVUE-370) INJECTION 76%
INTRAVENOUS | Status: AC
  Filled 2016-03-18: qty 50

## 2016-03-18 MED ORDER — VERAPAMIL HCL 2.5 MG/ML IV SOLN
INTRAVENOUS | Status: DC | PRN
  Administered 2016-03-18: 10 mL via INTRA_ARTERIAL

## 2016-03-18 MED ORDER — FENTANYL CITRATE (PF) 100 MCG/2ML IJ SOLN
INTRAMUSCULAR | Status: DC | PRN
  Administered 2016-03-18: 25 ug via INTRAVENOUS

## 2016-03-18 MED ORDER — VERAPAMIL HCL 2.5 MG/ML IV SOLN
INTRAVENOUS | Status: AC
  Filled 2016-03-18: qty 2

## 2016-03-18 MED ORDER — HEPARIN (PORCINE) IN NACL 100-0.45 UNIT/ML-% IJ SOLN
1550.0000 [IU]/h | INTRAMUSCULAR | Status: DC
Start: 2016-03-18 — End: 2016-03-23
  Administered 2016-03-18 – 2016-03-19 (×2): 1600 [IU]/h via INTRAVENOUS
  Administered 2016-03-20: 1800 [IU]/h via INTRAVENOUS
  Administered 2016-03-20 – 2016-03-22 (×3): 1650 [IU]/h via INTRAVENOUS
  Filled 2016-03-18 (×7): qty 250

## 2016-03-18 MED ORDER — LIDOCAINE HCL (PF) 1 % IJ SOLN
INTRAMUSCULAR | Status: DC | PRN
  Administered 2016-03-18: 1 mL via INTRADERMAL
  Administered 2016-03-18: 2 mL via INTRADERMAL
  Administered 2016-03-18: 15 mL via INTRADERMAL

## 2016-03-18 MED ORDER — POTASSIUM CHLORIDE CRYS ER 20 MEQ PO TBCR
40.0000 meq | EXTENDED_RELEASE_TABLET | Freq: Once | ORAL | Status: AC
  Administered 2016-03-18: 40 meq via ORAL
  Filled 2016-03-18: qty 2

## 2016-03-18 MED ORDER — HEPARIN (PORCINE) IN NACL 2-0.9 UNIT/ML-% IJ SOLN
INTRAMUSCULAR | Status: AC
  Filled 2016-03-18: qty 1000

## 2016-03-18 MED ORDER — LIDOCAINE HCL (PF) 1 % IJ SOLN
INTRAMUSCULAR | Status: AC
  Filled 2016-03-18: qty 30

## 2016-03-18 MED ORDER — HEPARIN (PORCINE) IN NACL 2-0.9 UNIT/ML-% IJ SOLN
INTRAMUSCULAR | Status: DC | PRN
  Administered 2016-03-18: 1000 mL

## 2016-03-18 SURGICAL SUPPLY — 21 items
CATH BALLN WEDGE 5F 110CM (CATHETERS) ×2 IMPLANT
CATH EXPO 5F FL5 (CATHETERS) ×2 IMPLANT
CATH INFINITI 5 FR MPA2 (CATHETERS) ×3 IMPLANT
CATH INFINITI 5F JL4 125CM (CATHETERS) ×2 IMPLANT
CATH INFINITI 5FR MULTPACK ANG (CATHETERS) ×2 IMPLANT
CATH LAUNCHER 5F EBU3.5 (CATHETERS) ×3 IMPLANT
COVER PRB 48X5XTLSCP FOLD TPE (BAG) IMPLANT
COVER PROBE 5X48 (BAG) ×6
DEVICE RAD COMP TR BAND LRG (VASCULAR PRODUCTS) ×2 IMPLANT
GLIDESHEATH SLEND SS 6F .021 (SHEATH) ×2 IMPLANT
GUIDEWIRE INQWIRE 1.5J.035X260 (WIRE) ×1 IMPLANT
INQWIRE 1.5J .035X260CM (WIRE) ×3
KIT HEART LEFT (KITS) ×3 IMPLANT
PACK CARDIAC CATHETERIZATION (CUSTOM PROCEDURE TRAY) ×3 IMPLANT
SET INTRODUCER MICROPUNCT 5F (INTRODUCER) ×2 IMPLANT
SHEATH FAST CATH BRACH 5F 5CM (SHEATH) ×2 IMPLANT
SHEATH PINNACLE 5F 10CM (SHEATH) ×2 IMPLANT
TRANSDUCER W/STOPCOCK (MISCELLANEOUS) ×3 IMPLANT
TUBING CIL FLEX 10 FLL-RA (TUBING) ×3 IMPLANT
WIRE HI TORQ VERSACORE-J 145CM (WIRE) ×2 IMPLANT
WIRE ROSEN-J .035X260CM (WIRE) ×2 IMPLANT

## 2016-03-18 NOTE — Care Management Important Message (Signed)
Important Message  Patient Details  Name: [name] MRN: [id number] Date of Birth: 08-23-1940   Medicare Important Message Given:  Yes    Kaydn Kumpf Montine Circle 03/18/2016, 2:58 PM

## 2016-03-18 NOTE — Progress Notes (Signed)
Patient Name: [name] Date of Encounter: 03/18/2016  Primary Cardiologist: Dr. Marinda Elk Problem List     Principal Problem:   Angina pectoris [facility]) Active Problems:   S/P ICD (internal cardiac defibrillator) procedure   Atrial fibrillation (HCC)   Cardiomyopathy, ischemic   Chronic systolic heart failure (HCC)    Subjective   No chest discomfort. Still has dyspnea with exertion. Planned for cath today as INR is <2.  Inpatient Medications    Scheduled Meds: . allopurinol  200 mg Oral Daily  . amLODipine  2.5 mg Oral Daily  . [START ON 03/19/2016] aspirin EC  81 mg Oral Daily  . atorvastatin  10 mg Oral Daily  . carvedilol  6.25 mg Oral BID  . cholecalciferol  1,000 Units Oral Daily  . insulin aspart  0-15 Units Subcutaneous TID WC  . insulin aspart  0-5 Units Subcutaneous QHS  . insulin aspart  12 Units Subcutaneous TID WC  . insulin detemir  10 Units Subcutaneous BID  . ipratropium  1 spray Each Nare Daily  . losartan  50 mg Oral Daily  . pantoprazole  40 mg Oral Daily  . polyvinyl alcohol  2 drop Both Eyes Daily  . sodium chloride flush  3 mL Intravenous Q12H  . sodium chloride flush  3 mL Intravenous Q12H   Continuous Infusions: . sodium chloride 10 mL/hr at 03/18/16 0600  . heparin 1,600 Units/hr (03/18/16 0127)   PRN Meds: sodium chloride, sodium chloride, acetaminophen, ALPRAZolam, celecoxib, gabapentin, methocarbamol, nitroGLYCERIN, ondansetron (ZOFRAN) IV, sodium chloride flush, sodium chloride flush, zolpidem   Vital Signs    Vitals:   03/17/16 1221 03/17/16 2033 03/17/16 2219 03/18/16 0632  BP: 130/87 111/71  101/71  Pulse: 93 (!) 54 74 86  Resp: 18 16  18   Temp: 97.9 F (36.6 C) 97.5 F (36.4 C)  97.7 F (36.5 C)  TempSrc: Oral Oral  Oral  SpO2: 96% 100%  98%  Weight:    253 lb 4.8 oz (114.9 kg)  Height:        Intake/Output Summary (Last 24 hours) at 03/18/16 1055 Last data filed at 03/18/16 1000  Gross per 24 hour    Intake           285.26 ml  Output             1250 ml  Net          -964.74 ml   Filed Weights   03/16/16 0610 03/17/16 0513 03/18/16 [id number]  Weight: 247 lb 14.4 oz (112.4 kg) 252 lb 3.3 oz (114.4 kg) 253 lb 4.8 oz (114.9 kg)    Physical Exam   GEN: Well nourished, well developed Caucasian male appearing in no acute distress.  HEENT: Grossly normal.  Neck: Supple, no JVD, carotid bruits, or masses. Cardiac: Irregularly irregular, no murmurs, rubs, or gallops. No clubbing or cyanosis. Trace lower extremity edema.  Radials/DP/PT 2+ and equal bilaterally.  Respiratory:  Respirations regular and unlabored, clear to auscultation bilaterally. GI: Soft, nontender, nondistended, BS + x 4. MS: no deformity or atrophy. Skin: warm and dry, no rash. Neuro:  Strength and sensation are intact. Psych: AAOx3.  Normal affect.  Labs    CBC  Recent Labs  03/15/16 1314 03/18/16 0337  WBC 5.7 5.4  HGB 13.8 13.2  HCT 43.0 41.1  MCV 84.6 83.2  PLT 173 [id number]   Basic Metabolic Panel  Recent Labs  03/15/16 1314  03/17/16 0451 03/18/16 [id number]  NA 143  < > 139 139  K 4.0  < > 3.3* 3.2*  CL 108  < > 105 106  CO2 23  < > 22 23  GLUCOSE 167*  < > 104* 90  BUN 30*  < > 28* 23*  CREATININE 1.61*  < > 1.61* 1.49*  CALCIUM 9.1  < > 8.9 8.9  MG 2.1  --   --   --   < > = values in this interval not displayed. Liver Function Tests  Recent Labs  03/16/16 0102  AST 17  ALT 13*  ALKPHOS 80  BILITOT 0.6  PROT 6.3*  ALBUMIN 3.4*   No results for input(s): LIPASE, AMYLASE in the last 72 hours. Cardiac Enzymes  Recent Labs  03/15/16 1913 03/16/16 0102 03/16/16 0721  TROPONINI 0.03* 0.04* <0.03   BNP Invalid input(s): POCBNP D-Dimer No results for input(s): DDIMER in the last 72 hours. Hemoglobin A1C  Recent Labs  03/15/16 1913  HGBA1C 6.3*   Fasting Lipid Panel  Recent Labs  03/16/16 0102  CHOL 124  HDL 41  LDLCALC 64  TRIG 95  CHOLHDL 3.0   Thyroid Function Tests No  results for input(s): TSH, T4TOTAL, T3FREE, THYROIDAB in the last 72 hours.  Invalid input(s): FREET3  Telemetry    Atrial fibrillation, HR in 60's - 80's. V-paced at times.  - Personally Reviewed  ECG    Atrial fibrillation, HR 75, with frequent PVC's. - Personally Reviewed  Radiology    Dg Chest 2 View  Result Date: 03/15/2016 CLINICAL DATA:  Shortness of breath for 3 days, cough, EXAM: CHEST  2 VIEW COMPARISON:  02/23/2016 FINDINGS: Cardiomegaly again noted. Cardiac pacemaker leads are unchanged in position. Status post median sternotomy. Stable left basilar scarring and pleural thickening. No definite superimposed infiltrate or pulmonary edema. IMPRESSION: Status post median sternotomy. Stable left basilar scarring and pleural thickening. No definite superimposed infiltrate or pulmonary edema. Electronically Signed   By: Lahoma Crocker M.D.   On: 03/15/2016 13:33    Cardiac Studies   Echocardiogram: 03/16/2016 Study Conclusions  - Left ventricle: The cavity size was mildly dilated. There was   moderate concentric hypertrophy. Systolic function was moderately   reduced. The estimated ejection fraction was in the range of 35%   to 40%. Diffuse hypokinesis. Doppler parameters are consistent   with restrictive physiology, indicative of decreased left   ventricular diastolic compliance and/or increased left atrial   pressure. Doppler parameters are consistent with elevated   ventricular end-diastolic filling pressure. - Ventricular septum: Septal motion showed paradox. - Aortic valve: Trileaflet; normal thickness leaflets.   Transvalvular velocity was within the normal range. There was no   stenosis. There was trivial regurgitation. - Aortic root: The aortic root was mildly dilated measuring 41 mm. - Ascending aorta: The ascending aorta was normal in size. - Mitral valve: There was mild regurgitation. - Left atrium: The atrium was severely dilated. - Right ventricle: Pacer wire  or catheter noted in right ventricle. - Right atrium: The atrium was moderately dilated. Pacer wire or   catheter noted in right atrium. - Tricuspid valve: There was trivial regurgitation. - Pulmonary arteries: Systolic pressure was within the normal   range. - Inferior vena cava: The vessel was normal in size. - Pericardium, extracardiac: There was no pericardial effusion.  Impressions:  - There is no significant difference since the prior study on   05/08/14. LVEF remains moderately decreased with diffuse   hypokinesis and paradoxical septal  motion.   Patient Profile     75 yo male with PMH of CAD (s/p CABG), AF, chronic systolic CHF (EF [id number] by echo in 04/2014), IDDM, polycythemia vera, Stage 3 CKD and PPM/ICD placement who presented to [facility] ED on 12/26 with ongoing, progressive dyspnea. Echo shows EF of 35-40%, consistent with prior imaging in 2016. Coumadin on hold for cath.    Assessment & Plan    1. Dyspnea on Exertion - reports he has been having worsening episodes of dyspnea with exertion for the past 2 weeks, but now seems to be coming more often. BNP slightly elevated at 610 and CXR on admission with no significant edema.  - there is concern this is most consistent with his previous unstable angina.  - cyclic troponin values have been flat at 0.03 and 0.04. - planned for cath today as his INR 1.73 to determine etiology of dyspnea.   2. Permanent AF - This patients CHA2DS2-VASc Score and unadjusted Ischemic Stroke Rate (% per year) is equal to 7.2 % stroke rate/year from a score of 5 (CHF, HTN, DM, Vascular, Age). Coumadin on hold in preparation for cath.  - continue BB for rate-control.   3. CAD - s/p CABG - continue ASA and statin therapy. Repeat ischemic evaluation as above.   4. Chronic systolic HF - EF 123456 by echo in 04/2014. Repeat echo this admission shows EF of 35-40% with diffuse HK.  - continue Coreg and Losartan.   5. IDDM - continue  Levemir and SSI.   6. Polycythemia Vera - Hgb stable at 13.8 this AM.   7. CKD stage II - baseline from earlier this year was 1.8- 1.9. Improved from admission to 1.49 and stable at this value today.   Signed, Reino Bellis, NP  03/18/2016, 10:55 AM   This at the station is performed after completion of coronary angiography.  The patient has been seen in conjunction with Reino Bellis, NP-C. All aspects of care have been considered and discussed. The patient has been personally interviewed, examined, and all clinical data has been reviewed.   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 The graft is otherwise widely patent to the LAD.Marland Kitchen  Perhaps the patient's dyspnea is an anginal equivalent as suggested by Dr. Stanford Breed.  Based upon findings from angiography, a potential treatment option would be stenting the ostial left internal mammary to improve antegrade LAD flow. This would be high risk as the subclavian and internal mammary ostium are calcified. I also note that there is antegrade LAD flow that competes very faintly with the IMA. Another option would be rotational atherectomy and stenting of the native LAD rather than working on the mammary. This will needs to be discussed with colleagues.  For the time being, therapy for heart failure needs to be optimized. Anticoagulation therapy should be resumed, and if high risk PCI decided upon, it will be done electively.

## 2016-03-18 NOTE — Progress Notes (Signed)
ANTICOAGULATION CONSULT NOTE - Follow Up Consult  Pharmacy Consult for heparin Indication: chest pain/ACS and atrial fibrillation  Allergies  Allergen Reactions  . Avelox [Moxifloxacin Hcl In Nacl] Swelling, Rash and Other (See Comments)    Patient became hypotensive after infusion started Because of a history of documented adverse serious drug reaction;Medi Alert bracelet  is recommended  . Penicillins Anaphylaxis, Other (See Comments) and Swelling    REACTION: anaphylaxis Because of a history of documented adverse serious drug reaction;Medi Alert bracelet  is recommended    Patient Measurements: Height: 6' (182.9 cm) Weight: 253 lb 4.8 oz (114.9 kg) (scale a) IBW/kg (Calculated) : 77.6 Heparin Dosing Weight: 102  Vital Signs: Temp: 97.4 F (36.3 C) (12/29 1200) Temp Source: Oral (12/29 1200) BP: 136/91 (12/29 1527) Pulse Rate: 86 (12/29 1527)  Labs:  Recent Labs  03/15/16 1913  03/16/16 0102 03/16/16 0721 03/16/16 1251  03/17/16 0451 03/17/16 0738 03/17/16 1543 03/18/16 0337  HGB  --   --   --   --   --   --   --   --   --  13.2  HCT  --   --   --   --   --   --   --   --   --  41.1  PLT  --   --   --   --   --   --   --   --   --  170  LABPROT  --   < > 24.9*  --  22.2*  --  23.4*  --   --  20.5*  INR  --   < > 2.21  --  1.91  --  2.05  --   --  1.73  HEPARINUNFRC  --   --   --   --   --   < >  --  0.47 0.63 0.54  CREATININE  --   --  1.58*  --   --   --  1.61*  --   --  1.49*  TROPONINI 0.03*  --  0.04* <0.03  --   --   --   --   --   --   < > = values in this interval not displayed.  Assessment: 75yo male with AFib s/p cath today.   Heparin to be restarted 8 hrs s/p sheath pull. CBC wnl, no bleeding noted. Cath 12/29: significant native CAD - medical optimization, if continues to have sign dyspnea, consider PCI to ostial LIMA HL therapeutic this morning at 0.54 on rate of 1600 units/hr. INR down to 1.73  Goal of Therapy:  Heparin level 0.3-0.7  units/ml Monitor platelets by anticoagulation protocol: Yes   Plan:  -resume Heparin at 1600 units/hr 8 hrs s/p sheath pull (1530) at 2330 - 8 hr HL at 0730 am  -Monitor daily HL, CBC, s/sx's bleeding -F/u restart warfarin plan   Eudelia Bunch, Pharm.D. BP:[phone] 03/18/2016 3:56 PM

## 2016-03-18 NOTE — Interval H&P Note (Signed)
History and Physical Interval Note:  03/18/2016 1:28 PM  JOEVANNI NORTHCUTT  has presented today for cardiac catheterization, with the diagnosis of DOE, unstable angina  The various methods of treatment have been discussed with the patient and family. After consideration of risks, benefits and other options for treatment, the patient has consented to  Procedure(s): RIGHT/LEFT HEART CATH AND CORONARY/GRAFT ANGIOGRAPHY (N/A) as a surgical intervention .  The patient's history has been reviewed, patient examined, no change in status, stable for surgery.  I have reviewed the patient's chart and labs.  Questions were answered to the patient's satisfaction.    Cath Lab Visit (complete for each Cath Lab visit)  Clinical Evaluation Leading to the Procedure:   ACS: Yes.   (unstable angina)  Non-ACS:    Anginal Classification: CCS IV (shortness of breath)  Anti-ischemic medical therapy: Maximal Therapy (2 or more classes of medications)  Non-Invasive Test Results: No non-invasive testing performed (Echo with LVEF 35-40%)  Prior CABG: Previous CABG  Camillo Quadros

## 2016-03-18 NOTE — H&P (View-Only) (Signed)
Patient Name: [name] Date of Encounter: 03/17/2016  Primary Cardiologist: Dr. Marinda Elk Problem List     Principal Problem:   Angina pectoris [facility]) Active Problems:   S/P ICD (internal cardiac defibrillator) procedure   Atrial fibrillation (HCC)   Cardiomyopathy, ischemic   Chronic systolic heart failure (HCC)    Subjective   No chest discomfort. Still has dyspnea with exertion. Anxiously awaiting for his INR to decline so he can go for cath.   Inpatient Medications    Scheduled Meds: . allopurinol  200 mg Oral Daily  . amLODipine  2.5 mg Oral Daily  . aspirin EC  81 mg Oral Daily  . atorvastatin  10 mg Oral Daily  . carvedilol  6.25 mg Oral BID  . cholecalciferol  1,000 Units Oral Daily  . insulin aspart  0-15 Units Subcutaneous TID WC  . insulin aspart  0-5 Units Subcutaneous QHS  . insulin aspart  12 Units Subcutaneous TID WC  . insulin detemir  10 Units Subcutaneous BID  . ipratropium  1 spray Each Nare Daily  . losartan  50 mg Oral Daily  . pantoprazole  40 mg Oral Daily  . polyvinyl alcohol  2 drop Both Eyes Daily  . sodium chloride flush  3 mL Intravenous Q12H   Continuous Infusions: . heparin 1,700 Units/hr (03/17/16 1023)   PRN Meds: sodium chloride, acetaminophen, ALPRAZolam, celecoxib, gabapentin, methocarbamol, nitroGLYCERIN, ondansetron (ZOFRAN) IV, sodium chloride flush, zolpidem   Vital Signs    Vitals:   03/16/16 1230 03/16/16 2014 03/17/16 0033 03/17/16 0513  BP: 129/69 130/81 (!) 105/49 (!) 141/81  Pulse: (!) 44 89 71 (!) 53  Resp: 18 20 20 18   Temp: 97.4 F (36.3 C) 97.5 F (36.4 C) 97.9 F (36.6 C) 97.7 F (36.5 C)  TempSrc: Oral Oral Oral Oral  SpO2: 97% 98% 95% 98%  Weight:    252 lb 3.3 oz (114.4 kg)  Height:        Intake/Output Summary (Last 24 hours) at 03/17/16 1115 Last data filed at 03/17/16 [id number]  Gross per 24 hour  Intake          1169.48 ml  Output             1025 ml  Net           144.48 ml    Filed Weights   03/15/16 1846 03/16/16 0610 03/17/16 0513  Weight: 250 lb 4.8 oz (113.5 kg) 247 lb 14.4 oz (112.4 kg) 252 lb 3.3 oz (114.4 kg)    Physical Exam   GEN: Well nourished, well developed Caucasian male appearing in no acute distress.  HEENT: Grossly normal.  Neck: Supple, no JVD, carotid bruits, or masses. Cardiac: Irregularly irregular, no murmurs, rubs, or gallops. No clubbing or cyanosis. Trace lower extremity edema.  Radials/DP/PT 2+ and equal bilaterally.  Respiratory:  Respirations regular and unlabored, clear to auscultation bilaterally. GI: Soft, nontender, nondistended, BS + x 4. MS: no deformity or atrophy. Skin: warm and dry, no rash. Neuro:  Strength and sensation are intact. Psych: AAOx3.  Normal affect.  Labs    CBC  Recent Labs  03/15/16 1314  WBC 5.7  HGB 13.8  HCT 43.0  MCV 84.6  PLT [id number]   Basic Metabolic Panel  Recent Labs  03/15/16 1314 03/16/16 0102 03/17/16 0451  NA 143 143 139  K 4.0 3.8 3.3*  CL 108 106 105  CO2 23 27 22   GLUCOSE 167* 128* 104*  BUN 30* 28* 28*  CREATININE 1.61* 1.58* 1.61*  CALCIUM 9.1 9.3 8.9  MG 2.1  --   --    Liver Function Tests  Recent Labs  03/16/16 0102  AST 17  ALT 13*  ALKPHOS 80  BILITOT 0.6  PROT 6.3*  ALBUMIN 3.4*   No results for input(s): LIPASE, AMYLASE in the last 72 hours. Cardiac Enzymes  Recent Labs  03/15/16 1913 03/16/16 0102 03/16/16 0721  TROPONINI 0.03* 0.04* <0.03   BNP Invalid input(s): POCBNP D-Dimer No results for input(s): DDIMER in the last 72 hours. Hemoglobin A1C  Recent Labs  03/15/16 1913  HGBA1C 6.3*   Fasting Lipid Panel  Recent Labs  03/16/16 0102  CHOL 124  HDL 41  LDLCALC 64  TRIG 95  CHOLHDL 3.0   Thyroid Function Tests No results for input(s): TSH, T4TOTAL, T3FREE, THYROIDAB in the last 72 hours.  Invalid input(s): FREET3  Telemetry    Atrial fibrillation, HR in 60's - 80's. V-paced at times.  - Personally Reviewed  ECG     Atrial fibrillation, HR 75, with frequent PVC's. - Personally Reviewed  Radiology    Dg Chest 2 View  Result Date: 03/15/2016 CLINICAL DATA:  Shortness of breath for 3 days, cough, EXAM: CHEST  2 VIEW COMPARISON:  02/23/2016 FINDINGS: Cardiomegaly again noted. Cardiac pacemaker leads are unchanged in position. Status post median sternotomy. Stable left basilar scarring and pleural thickening. No definite superimposed infiltrate or pulmonary edema. IMPRESSION: Status post median sternotomy. Stable left basilar scarring and pleural thickening. No definite superimposed infiltrate or pulmonary edema. Electronically Signed   By: Lahoma Crocker M.D.   On: 03/15/2016 13:33    Cardiac Studies   Echocardiogram: 03/16/2016 Study Conclusions  - Left ventricle: The cavity size was mildly dilated. There was   moderate concentric hypertrophy. Systolic function was moderately   reduced. The estimated ejection fraction was in the range of 35%   to 40%. Diffuse hypokinesis. Doppler parameters are consistent   with restrictive physiology, indicative of decreased left   ventricular diastolic compliance and/or increased left atrial   pressure. Doppler parameters are consistent with elevated   ventricular end-diastolic filling pressure. - Ventricular septum: Septal motion showed paradox. - Aortic valve: Trileaflet; normal thickness leaflets.   Transvalvular velocity was within the normal range. There was no   stenosis. There was trivial regurgitation. - Aortic root: The aortic root was mildly dilated measuring 41 mm. - Ascending aorta: The ascending aorta was normal in size. - Mitral valve: There was mild regurgitation. - Left atrium: The atrium was severely dilated. - Right ventricle: Pacer wire or catheter noted in right ventricle. - Right atrium: The atrium was moderately dilated. Pacer wire or   catheter noted in right atrium. - Tricuspid valve: There was trivial regurgitation. - Pulmonary  arteries: Systolic pressure was within the normal   range. - Inferior vena cava: The vessel was normal in size. - Pericardium, extracardiac: There was no pericardial effusion.  Impressions:  - There is no significant difference since the prior study on   05/08/14. LVEF remains moderately decreased with diffuse   hypokinesis and paradoxical septal motion.   Patient Profile     75 yo male with PMH of CAD (s/p CABG), AF, chronic systolic CHF (EF [id number] by echo in 04/2014), IDDM, polycythemia vera, Stage 3 CKD and PPM/ICD placement who presented to [facility] ED on 12/26 with ongoing, progressive dyspnea. Echo shows EF of 35-40%, consistent with prior imaging in 2016.  Coumadin on hold for cath.    Assessment & Plan    1. Dyspnea on Exertion - reports he has been having worsening episodes of dyspnea with exertion for the past 2 weeks, but now seems to be coming more often. BNP slightly elevated at 610 and CXR on admission with no significant edema.  - there is concern this is most consistent with his previous unstable angina.  - cyclic troponin values have been flat at 0.03 and 0.04. - plan is for a cardiac catheterization for definitive evaluation once INR < 1.8, possibly tomorrow pending INR. At 2.05 this AM, being bridged with Heparin as INR was 1.91 yesterday. Will tentatively add to the cath board for tomorrow afternoon pending INR results.    2. Permanent AF - This patients CHA2DS2-VASc Score and unadjusted Ischemic Stroke Rate (% per year) is equal to 7.2 % stroke rate/year from a score of 5 (CHF, HTN, DM, Vascular, Age). Coumadin on hold in preparation for cath.  - continue BB for rate-control.   3. CAD - s/p CABG - continue ASA and statin therapy. Repeat ischemic evaluation as above.   4. Chronic systolic HF - EF [id number] by echo in 04/2014. Repeat echo this admission shows EF of 35-40% with diffuse HK.  - continue Coreg and Losartan.   5. IDDM - continue Levemir and SSI.    6. Polycythemia Vera - Hgb stable at 13.8 this AM.   7. CKD stage II - baseline from earlier this year was 1.8- 1.9. Improved on admission to 1.61 and stable at this value today.   Signed, Erma Heritage, PA  03/17/2016, 11:15 AM   The patient has been seen in conjunction with Bernerd Pho, PA. All aspects of care have been considered and discussed. The patient has been personally interviewed, examined, and all clinical data has been reviewed.   Agree with plans to perform right and left heart cath with coronary and bypass graft angiography. Hopefully this will explain the etiology for the patient's dyspnea.  If cath is unrevealing, need to consider primary pulmonary cause including the remote possibility of recurrent pulmonary emboli.  INR today is 2.0. Hopefully, we will be less than 1.7 tomorrow and heart cath via the radial brachial approach will be possible.

## 2016-03-18 NOTE — Brief Op Note (Signed)
Brief Cardiac Catheterization Note (Full Report to Follow)  Date: 03/18/2016 Time: 3:27 PM  PATIENT:  [name]  75 y.o. male  PRE-OPERATIVE DIAGNOSIS:  DOE, unstable angina  POST-OPERATIVE DIAGNOSIS:  Same  PROCEDURE:  Procedure(s): RIGHT/LEFT HEART CATH AND CORONARY/GRAFT ANGIOGRAPHY (N/A)  SURGEON:  Surgeon(s) and Role:    * Nelva Bush, MD - Primary  FINDINGS: 1.  Significant native coronary artery disease, including diffusely diseased prox/mid RCA with distal RCA occlusion, hazy 70% proximal LAD and 100% mid LAD stenoses, and calcified 50-60% mid LCx stenosis.  At least 2 OM branches are occluded. 2.  Patent LIMA to LAD with 60% ostial LIMA stenosis. 3.  Patent SVG to OM and SVG to PDA.  Haziness in proximal SVG to OM likely represents a valve. 4.  Elevated left and right heart filling pressures (mean RA 11, mean PAP 30, PCWP 19, LVEDP 21 mmHg). 5.  Reduced Fick cardiac output and index (CO 5.0, CI 2.1).  RECOMMENDATIONS: 1.  Medical optimization including diuresis beginning tomorrow. 2.  If patient continues to have significant dyspnea, can consider PCI to ostial LIMA. 3.  Aggressive secondary prevention.  Nelva Bush, MD [facility] Pager: [phone]

## 2016-03-18 NOTE — Progress Notes (Signed)
Site area: rt groin Site Prior to Removal:  Level 0 Pressure Applied For: 20 minutes Manual:   yes Patient Status During Pull:  stable Post Pull Site:  Level 0 Post Pull Instructions Given:  yes Post Pull Pulses Present: yes Dressing Applied:  Yes   Bedrest begins @ 1630 Comments:

## 2016-03-18 NOTE — Progress Notes (Signed)
ANTICOAGULATION CONSULT NOTE - Follow Up Consult  Pharmacy Consult for heparin Indication: chest pain/ACS and atrial fibrillation  Allergies  Allergen Reactions  . Avelox [Moxifloxacin Hcl In Nacl] Swelling, Rash and Other (See Comments)    Patient became hypotensive after infusion started Because of a history of documented adverse serious drug reaction;Medi Alert bracelet  is recommended  . Penicillins Anaphylaxis, Other (See Comments) and Swelling    REACTION: anaphylaxis Because of a history of documented adverse serious drug reaction;Medi Alert bracelet  is recommended    Patient Measurements: Height: 6' (182.9 cm) Weight: 253 lb 4.8 oz (114.9 kg) (scale a) IBW/kg (Calculated) : 77.6 Heparin Dosing Weight: 102  Vital Signs: Temp: 97.7 F (36.5 C) (12/29 0632) Temp Source: Oral (12/29 [id number]) BP: 101/71 (12/29 [id number]) Pulse Rate: 86 (12/29 0632)  Labs:  Recent Labs  03/15/16 1314 03/15/16 1913 03/16/16 0102 03/16/16 0721 03/16/16 1251  03/17/16 0451 03/17/16 0738 03/17/16 1543 03/18/16 0337  HGB 13.8  --   --   --   --   --   --   --   --  13.2  HCT 43.0  --   --   --   --   --   --   --   --  41.1  PLT 173  --   --   --   --   --   --   --   --  170  LABPROT 28.1*  --  24.9*  --  22.2*  --  23.4*  --   --  20.5*  INR 2.57  --  2.21  --  1.91  --  2.05  --   --  1.73  HEPARINUNFRC  --   --   --   --   --   < >  --  0.47 0.63 0.54  CREATININE 1.61*  --  1.58*  --   --   --  1.61*  --   --  1.49*  TROPONINI  --  0.03* 0.04* <0.03  --   --   --   --   --   --   < > = values in this interval not displayed.  Assessment: 75yo male with AFib and possible ACS with cath planned today 12/29 now that INR <1.8.   -CBC wnl, no bleeding noted -HL therapeutic 0.54 -INR down to 1.73  Goal of Therapy:  Heparin level 0.3-0.7 units/ml Monitor platelets by anticoagulation protocol: Yes   Plan:  -Continue Heparin 1600 units/hr -Monitor daily HL, CBC, s/sx's bleeding -F/u  restart warfarin plan post-cath planned for 12/29  Myer Peer Grayland Ormond), PharmD  PGY1 Pharmacy Resident Pager: [phone] 03/18/2016 8:14 AM

## 2016-03-19 LAB — CBC
HCT: 41.6 % (ref 39.0–52.0)
HEMATOCRIT: 40.5 % (ref 39.0–52.0)
HEMOGLOBIN: 13.2 g/dL (ref 13.0–17.0)
Hemoglobin: 13.3 g/dL (ref 13.0–17.0)
MCH: 26.8 pg (ref 26.0–34.0)
MCH: 27.4 pg (ref 26.0–34.0)
MCHC: 32 g/dL (ref 30.0–36.0)
MCHC: 32.6 g/dL (ref 30.0–36.0)
MCV: 83.7 fL (ref 78.0–100.0)
MCV: 84 fL (ref 78.0–100.0)
PLATELETS: 184 10*3/uL (ref 150–400)
Platelets: 165 10*3/uL (ref 150–400)
RBC: 4.97 MIL/uL (ref 4.22–5.81)
RBC: 4.82 MIL/uL (ref 4.22–5.81)
RDW: 17 % — AB (ref 11.5–15.5)
RDW: 17.2 % — ABNORMAL HIGH (ref 11.5–15.5)
WBC: 6.6 10*3/uL (ref 4.0–10.5)
WBC: 6.6 10*3/uL (ref 4.0–10.5)

## 2016-03-19 LAB — GLUCOSE, CAPILLARY
Glucose-Capillary: 83 mg/dL (ref 65–99)
Glucose-Capillary: 94 mg/dL (ref 65–99)
Glucose-Capillary: 105 mg/dL — ABNORMAL HIGH (ref 65–99)
Glucose-Capillary: 145 mg/dL — ABNORMAL HIGH (ref 65–99)

## 2016-03-19 LAB — BASIC METABOLIC PANEL
ANION GAP: 6 (ref 5–15)
BUN: 21 mg/dL — ABNORMAL HIGH (ref 6–20)
CO2: 24 mmol/L (ref 22–32)
Calcium: 8.9 mg/dL (ref 8.9–10.3)
Chloride: 110 mmol/L (ref 101–111)
Creatinine, Ser: 1.36 mg/dL — ABNORMAL HIGH (ref 0.61–1.24)
GFR calc non Af Amer: 50 mL/min — ABNORMAL LOW (ref >60)
GFR, EST AFRICAN AMERICAN: 58 mL/min — AB (ref >60)
GLUCOSE: 90 mg/dL (ref 65–99)
POTASSIUM: 3.9 mmol/L (ref 3.5–5.1)
Sodium: 140 mmol/L (ref 135–145)

## 2016-03-19 LAB — HEPARIN LEVEL (UNFRACTIONATED): HEPARIN UNFRACTIONATED: 0.47 [IU]/mL (ref 0.30–0.70)

## 2016-03-19 LAB — OCCULT BLOOD X 1 CARD TO LAB, STOOL: Fecal Occult Bld: NEGATIVE

## 2016-03-19 LAB — PROTIME-INR
INR: 1.31
PROTHROMBIN TIME: 16.3 s — AB (ref 11.4–15.2)

## 2016-03-19 MED ORDER — SODIUM CHLORIDE 0.9% FLUSH: 3.0000 mL | INTRAVENOUS | Status: DC | PRN

## 2016-03-19 MED ORDER — SODIUM CHLORIDE 0.9% FLUSH: 3.0000 mL | Freq: Two times a day (BID) | INTRAVENOUS | Status: DC

## 2016-03-19 MED ORDER — FUROSEMIDE 10 MG/ML IJ SOLN
80.0000 mg | Freq: Two times a day (BID) | INTRAMUSCULAR | Status: DC
Start: 2016-03-19 — End: 2016-03-20
  Administered 2016-03-19 – 2016-03-20 (×2): 80 mg via INTRAVENOUS
  Filled 2016-03-19 (×2): qty 8

## 2016-03-19 MED ORDER — SODIUM CHLORIDE 0.9 % IV SOLN: 250.0000 mL | INTRAVENOUS | Status: DC | PRN

## 2016-03-19 MED ORDER — POTASSIUM CHLORIDE CRYS ER 20 MEQ PO TBCR
20.0000 meq | EXTENDED_RELEASE_TABLET | Freq: Two times a day (BID) | ORAL | Status: DC
  Administered 2016-03-19 – 2016-03-23 (×9): 20 meq via ORAL
  Filled 2016-03-19 (×9): qty 1

## 2016-03-19 MED ORDER — WARFARIN SODIUM 2.5 MG PO TABS
2.5000 mg | ORAL_TABLET | Freq: Once | ORAL | Status: AC
  Administered 2016-03-19: 2.5 mg via ORAL
  Filled 2016-03-19: qty 1

## 2016-03-19 MED ORDER — WARFARIN - PHARMACIST DOSING INPATIENT
Freq: Every day | Status: DC
  Administered 2016-03-20 – 2016-03-22 (×3)

## 2016-03-19 NOTE — Progress Notes (Signed)
Verified with D.Dunn,PA; that pt will receive heparin until the inr is at goal at which time the warfarin will continue alone.

## 2016-03-19 NOTE — Progress Notes (Signed)
Occult blood test released, sent to lab.  Per 0 of 3 status at todays release; previously reported fecal occult blood must have been performed POCT at bedside; negative results reported.   Second specimen in lab for processing.

## 2016-03-19 NOTE — Progress Notes (Signed)
Subjective: Breathing is short  No CP   Objective: Vitals:   03/18/16 1745 03/18/16 1800 03/18/16 2046 03/19/16 0557  BP: 131/81 (!) 163/94 116/78 122/77  Pulse: 100 98 83 79  Resp:   18 18  Temp:   98.2 F (36.8 C) 98.2 F (36.8 C)  TempSrc:   Oral Oral  SpO2:   99% 98%  Weight:    250 lb 12.8 oz (113.8 kg)  Height:       Weight change: -2 lb 8 oz (-1.134 kg)  Intake/Output Summary (Last 24 hours) at 03/19/16 1124 Last data filed at 03/19/16 0600  Gross per 24 hour  Intake            349.6 ml  Output             1025 ml  Net           -675.4 ml    General: Alert, awake, oriented x3, in no acute distress Neck:  Neck is full   Heart: Regular rate and rhythm, without murmurs, rubs, gallops.  Lungs: Mild rhonchi  s. Exemities:  No edema.   Neuro: Grossly intact, nonfocal. Te;e"  Afib 80s    Lab Results: Results for orders placed or performed during the hospital encounter of 03/15/16 (from the past 24 hour(s))  I-STAT 3, venous blood gas (G3P V)     Status: Abnormal   Collection Time: 03/18/16  1:59 PM  Result Value Ref Range   pH, Ven 7.328 7.250 - 7.430   pCO2, Ven 43.4 (L) 44.0 - 60.0 mmHg   pO2, Ven 34.0 32.0 - 45.0 mmHg   Bicarbonate 22.8 20.0 - 28.0 mmol/L   TCO2 24 0 - 100 mmol/L   O2 Saturation 61.0 %   Acid-base deficit 3.0 (H) 0.0 - 2.0 mmol/L   Patient temperature HIDE    Sample type VENOUS   I-STAT 3, arterial blood gas (G3+)     Status: Abnormal   Collection Time: 03/18/16  2:02 PM  Result Value Ref Range   pH, Arterial 7.340 (L) 7.350 - 7.450   pCO2 arterial 41.9 32.0 - 48.0 mmHg   pO2, Arterial 84.0 83.0 - 108.0 mmHg   Bicarbonate 22.6 20.0 - 28.0 mmol/L   TCO2 24 0 - 100 mmol/L   O2 Saturation 96.0 %   Acid-base deficit 3.0 (H) 0.0 - 2.0 mmol/L   Patient temperature HIDE    Sample type ARTERIAL   POCT Activated clotting time     Status: None   Collection Time: 03/18/16  3:15 PM  Result Value Ref Range   Activated Clotting Time 180 seconds   Glucose, capillary     Status: None   Collection Time: 03/18/16  3:59 PM  Result Value Ref Range   Glucose-Capillary 79 65 - 99 mg/dL  POCT Activated clotting time     Status: None   Collection Time: 03/18/16  4:01 PM  Result Value Ref Range   Activated Clotting Time 169 seconds  Glucose, capillary     Status: None   Collection Time: 03/18/16  5:15 PM  Result Value Ref Range   Glucose-Capillary 78 65 - 99 mg/dL   Comment 1 Notify RN    Comment 2 Document in Chart   Glucose, capillary     Status: None   Collection Time: 03/18/16 10:12 PM  Result Value Ref Range   Glucose-Capillary 91 65 - 99 mg/dL  Glucose, capillary     Status: None  Collection Time: 03/19/16  5:34 AM  Result Value Ref Range   Glucose-Capillary 83 65 - 99 mg/dL  Heparin level (unfractionated)     Status: None   Collection Time: 03/19/16  7:20 AM  Result Value Ref Range   Heparin Unfractionated 0.47 0.30 - 0.70 IU/mL  CBC     Status: Abnormal   Collection Time: 03/19/16  7:20 AM  Result Value Ref Range   WBC 6.6 4.0 - 10.5 K/uL   RBC 4.97 4.22 - 5.81 MIL/uL   Hemoglobin 13.3 13.0 - 17.0 g/dL   HCT 41.6 39.0 - 52.0 %   MCV 83.7 78.0 - 100.0 fL   MCH 26.8 26.0 - 34.0 pg   MCHC 32.0 30.0 - 36.0 g/dL   RDW 17.0 (H) 11.5 - 15.5 %   Platelets 184 150 - 400 K/uL   *Note: Due to a large number of results and/or encounters for the requested time period, some results have not been displayed. A complete set of results can be found in Results Review.    Studies/Results: No results found.  Medications: REviewed     @PROBHOSP @  A  CAD   Cath yestreday showed: 1.  Significant native coronary artery disease, including diffusely diseased prox/mid RCA with distal RCA occlusion, hazy 70% proximal LAD and 100% mid LAD stenoses, and calcified 50-60% mid LCx stenosis.  At least 2 OM branches are occluded. 2.  Patent LIMA to LAD with 60% ostial LIMA stenosis. 3.  Patent SVG to OM and SVG to PDA.  Haziness in proximal  SVG to OM likely represents a valve. 4.  Elevated left and right heart filling pressures (mean RA 11, mean PAP 30, PCWP 19, LVEDP 21 mmHg). 5.  Reduced Fick cardiac output and index (CO 5.0, CI 2.1).  RECOMMENDATIONS: 1.  Medical optimization including diuresis beginning tomorrow. 2.  If patient continues to have significant dyspnea, can consider PCI to ostial LIMA. 3.  Aggressive secondary prevention.  B.  Permanat atrial fib  REsume coumadin  Continue rate control  C  CHF  As noted above  WIll begin diuresis    Follow renal function    LOS: 4 days   Dorris Carnes 03/19/2016, 11:24 AM

## 2016-03-19 NOTE — Progress Notes (Signed)
ANTICOAGULATION CONSULT NOTE - Initial Consult  Pharmacy Consult for warfarin Indication: atrial fibrillation  Allergies  Allergen Reactions  . Avelox [Moxifloxacin Hcl In Nacl] Swelling, Rash and Other (See Comments)    Patient became hypotensive after infusion started Because of a history of documented adverse serious drug reaction;Medi Alert bracelet  is recommended  . Penicillins Anaphylaxis, Other (See Comments) and Swelling    REACTION: anaphylaxis Because of a history of documented adverse serious drug reaction;Medi Alert bracelet  is recommended    Patient Measurements: Height: 6' (182.9 cm) Weight: 250 lb 12.8 oz (113.8 kg) IBW/kg (Calculated) : 77.6  Vital Signs: Temp: 98.2 F (36.8 C) (12/30 0557) Temp Source: Oral (12/30 0557) BP: 122/77 (12/30 0557) Pulse Rate: 79 (12/30 0557)  Labs:  Recent Labs  03/16/16 1251  03/17/16 0451  03/17/16 1543  03/18/16 0337 03/19/16 0720 03/19/16 1105  HGB  --   --   --   --   --   < > 13.2 13.3 13.2  HCT  --   --   --   --   --   --  41.1 41.6 40.5  PLT  --   --   --   --   --   --  170 184 165  LABPROT 22.2*  --  23.4*  --   --   --  20.5*  --   --   INR 1.91  --  2.05  --   --   --  1.73  --   --   HEPARINUNFRC  --   < >  --   < > 0.63  --  0.54 0.47  --   CREATININE  --   --  1.61*  --   --   --  1.49*  --  1.36*  < > = values in this interval not displayed.  Estimated Creatinine Clearance: 62.1 mL/min (by C-G formula based on SCr of 1.36 mg/dL (H)).   Medical History: Past Medical History:  Diagnosis Date  . AAA (abdominal aortic aneurysm) [facility])    Surgical repair  . Adenomatous colon polyp   . AICD (automatic cardioverter/defibrillator) present   . Alcohol ingestion of more than four drinks per week    Excess beer  not a dependency problem  . Aortic valve sclerosis   . Arthritis   . Ascending aortic aneurysm ([place]) 05/10/2014  . Atrial fibrillation ([place])    Previous long-term amiodarone therapy with  multiple cardioversions / amiodarone stopped September, 2009  . Atrial flutter [facility])    Started November, 2010, Left-sided and cannot ablate  . Bony abnormality    Patient's manubrium is slightly displaced to the right  . CAD (coronary artery disease)    Catheterization July, 2008... name and vein grafts patent but low cardiac output  . Cardiomyopathy    Ischemic... ICD  . Carotid artery disease ([place])    Doppler, December, 2013, 0-39% bilateral  . Chronotropic incompetence    IV pacing rate adjusted  . Combined systolic and diastolic CHF   . COPD (chronic obstructive pulmonary disease) ([place])   . Discolored skin   . Diverticulosis   . Drug therapy    Redness and swelling with Avelox infusion May 24, 2011  . Ejection fraction    Ejection fraction has varied over time from 35-50%.,, Echoes are technically very difficult,,, EF 50%, echo, May 25, 2011, technically very difficult  . Eye abnormality    Ophthalmologist questions a clot in one of  his eyes, May, 2012  . Gout   . Hyperlipidemia   . Hypertension   . Internal hemorrhoids   . Left atrial thrombus    Remote past... cardioversions done since that time  . Left ventricular ejection fraction less than 40%   . Mitral regurgitation    Mild echo  . Myocardial infarction   . Nasal drainage    Chronic  . Overweight(278.02)    November, 2012  . Pericardial effusion   . Pleural effusion    Large loculated effusion on the left side November, 2011. This was tapped. It was exudative. Cytology revealed no cancer no proof of mesothelioma area pulmonary team felt that no further workup was needed  . Pleural thickening   . Pneumonia   . Polycythemia vera ([place]) 07/28/2014  . Presence of permanent cardiac pacemaker   . SOB (shortness of breath)    Large left effusion/ thoracentesis/hospitalization/November, 2011... exudated.. cytology negative.. Dr.Wert.. no proof of mesothelioma  . Spinal stenosis    Surgery Dr.Elsner  .  Thrombophlebitis of superficial veins of upper extremities    Possible venous stenosis from defibrillator  . Type II diabetes mellitus ([place])   . Venous insufficiency    Toe discoloration chronic  . Ventral hernia    April, 2014, result of his abdominal surgery  . Warfarin anticoagulation   . Wide-complex tachycardia [facility])     Assessment:  75 y/o M with afib s/p cath on 12/29. Pharmacy consulted to dose heparin. Pt now to resume PTA warfarin in addition to heparin. CBC stable. INR on 12/29 was 1.73. Last dose of warfarin on 12/25. INR on admission 2.57.   PTA warfarin dose: 2.5 mg daily except 5mg  on Wednesday  Goal of Therapy:  INR 2-3 Monitor platelets by anticoagulation protocol: Yes   Plan:  -Continue heparin at 1600 units/hr -Warfarin po 2.5mg  x 1 tonight -Daily heparin level, CBC, INR -Monitor for S&S of bleed  Angela Burke, PharmD, BCPS Pharmacy Resident Pager: [phone] 03/19/2016,12:16 PM

## 2016-03-19 NOTE — Progress Notes (Signed)
ANTICOAGULATION CONSULT NOTE - Follow Up Consult  Pharmacy Consult for heparin Indication: chest pain/ACS and atrial fibrillation  Allergies  Allergen Reactions  . Avelox [Moxifloxacin Hcl In Nacl] Swelling, Rash and Other (See Comments)    Patient became hypotensive after infusion started Because of a history of documented adverse serious drug reaction;Medi Alert bracelet  is recommended  . Penicillins Anaphylaxis, Other (See Comments) and Swelling    REACTION: anaphylaxis Because of a history of documented adverse serious drug reaction;Medi Alert bracelet  is recommended    Patient Measurements: Height: 6' (182.9 cm) Weight: 250 lb 12.8 oz (113.8 kg) IBW/kg (Calculated) : 77.6 Heparin Dosing Weight: 102  Vital Signs: Temp: 98.2 F (36.8 C) (12/30 0557) Temp Source: Oral (12/30 0557) BP: 122/77 (12/30 0557) Pulse Rate: 79 (12/30 0557)  Labs:  Recent Labs  03/16/16 1251  03/17/16 0451  03/17/16 1543 03/18/16 0337 03/19/16 0720  HGB  --   --   --   --   --  13.2 13.3  HCT  --   --   --   --   --  41.1 41.6  PLT  --   --   --   --   --  170 184  LABPROT 22.2*  --  23.4*  --   --  20.5*  --   INR 1.91  --  2.05  --   --  1.73  --   HEPARINUNFRC  --   < >  --   < > 0.63 0.54 0.47  CREATININE  --   --  1.61*  --   --  1.49*  --   < > = values in this interval not displayed.  Assessment: 75yo male with AFib s/p cath on 12/29. Pharmacy consulted to restart heparin s/p sheath pull. Heparin has been restarted and AM heparin level is therapeutic. CBC stable. No issues noted.   Goal of Therapy:  Heparin level 0.3-0.7 units/ml Monitor platelets by anticoagulation protocol: Yes   Plan:  -Continue heparin at 1600 units/hr -Monitor daily HL, CBC, s/sx's bleeding -F/u oral anticoagulation plan  Angela Burke, PharmD, BCPS Pharmacy Resident Pager: [phone] 03/19/2016 8:54 AM

## 2016-03-19 NOTE — Progress Notes (Signed)
Pt requests PRN daily Celebrex, medication is available in pt's daily med bin but order no longer exists. Per pharmacy med was cancelled by Dr End. Page to Dr End resulted in call confirming intentional cancellation of Celebrex order due to renal insufficiency. Pt provided tylenol 650mg  for now, will offer pt his gabapentin PRN if he does not receive relief with tylenol alone.

## 2016-03-20 DIAGNOSIS — I25708 Atherosclerosis of coronary artery bypass graft(s), unspecified, with other forms of angina pectoris: Secondary | ICD-10-CM

## 2016-03-20 DIAGNOSIS — I5023 Acute on chronic systolic (congestive) heart failure: Secondary | ICD-10-CM

## 2016-03-20 DIAGNOSIS — I10 Essential (primary) hypertension: Secondary | ICD-10-CM

## 2016-03-20 DIAGNOSIS — Z95810 Presence of automatic (implantable) cardiac defibrillator: Secondary | ICD-10-CM

## 2016-03-20 DIAGNOSIS — I4891 Unspecified atrial fibrillation: Secondary | ICD-10-CM

## 2016-03-20 LAB — CBC
HCT: 39.9 % (ref 39.0–52.0)
Hemoglobin: 12.7 g/dL — ABNORMAL LOW (ref 13.0–17.0)
MCH: 26.7 pg (ref 26.0–34.0)
MCHC: 31.8 g/dL (ref 30.0–36.0)
MCV: 83.8 fL (ref 78.0–100.0)
PLATELETS: 154 10*3/uL (ref 150–400)
RBC: 4.76 MIL/uL (ref 4.22–5.81)
RDW: 17 % — AB (ref 11.5–15.5)
WBC: 5.8 10*3/uL (ref 4.0–10.5)

## 2016-03-20 LAB — BASIC METABOLIC PANEL
Anion gap: 11 (ref 5–15)
BUN: 22 mg/dL — ABNORMAL HIGH (ref 6–20)
CHLORIDE: 109 mmol/L (ref 101–111)
CO2: 19 mmol/L — ABNORMAL LOW (ref 22–32)
CREATININE: 1.49 mg/dL — AB (ref 0.61–1.24)
Calcium: 9.3 mg/dL (ref 8.9–10.3)
GFR, EST AFRICAN AMERICAN: 52 mL/min — AB (ref >60)
GFR, EST NON AFRICAN AMERICAN: 44 mL/min — AB (ref >60)
Glucose, Bld: 115 mg/dL — ABNORMAL HIGH (ref 65–99)
Potassium: 3.9 mmol/L (ref 3.5–5.1)
SODIUM: 139 mmol/L (ref 135–145)

## 2016-03-20 LAB — GLUCOSE, CAPILLARY
GLUCOSE-CAPILLARY: 103 mg/dL — AB (ref 65–99)
GLUCOSE-CAPILLARY: 167 mg/dL — AB (ref 65–99)
Glucose-Capillary: 64 mg/dL — ABNORMAL LOW (ref 65–99)
Glucose-Capillary: 133 mg/dL — ABNORMAL HIGH (ref 65–99)
Glucose-Capillary: 116 mg/dL — ABNORMAL HIGH (ref 65–99)

## 2016-03-20 LAB — HEPARIN LEVEL (UNFRACTIONATED)
HEPARIN UNFRACTIONATED: 0.5 [IU]/mL (ref 0.30–0.70)
Heparin Unfractionated: 0.22 IU/mL — ABNORMAL LOW (ref 0.30–0.70)
Heparin Unfractionated: 0.81 IU/mL — ABNORMAL HIGH (ref 0.30–0.70)

## 2016-03-20 LAB — PROTIME-INR
INR: 1.25
Prothrombin Time: 15.8 seconds — ABNORMAL HIGH (ref 11.4–15.2)

## 2016-03-20 LAB — OCCULT BLOOD X 1 CARD TO LAB, STOOL: FECAL OCCULT BLD: NEGATIVE

## 2016-03-20 MED ORDER — WARFARIN SODIUM 2.5 MG PO TABS
2.5000 mg | ORAL_TABLET | Freq: Once | ORAL | Status: AC
  Administered 2016-03-20: 2.5 mg via ORAL
  Filled 2016-03-20: qty 1

## 2016-03-20 MED ORDER — FUROSEMIDE 10 MG/ML IJ SOLN
40.0000 mg | Freq: Two times a day (BID) | INTRAMUSCULAR | Status: DC
Start: 2016-03-20 — End: 2016-03-23
  Administered 2016-03-20 – 2016-03-23 (×6): 40 mg via INTRAVENOUS
  Filled 2016-03-20 (×6): qty 4

## 2016-03-20 NOTE — Progress Notes (Signed)
ANTICOAGULATION CONSULT NOTE - Follow Up Consult  Pharmacy Consult for heparin Indication: chest pain/ACS and atrial fibrillation  Allergies  Allergen Reactions  . Avelox [Moxifloxacin Hcl In Nacl] Swelling, Rash and Other (See Comments)    Patient became hypotensive after infusion started Because of a history of documented adverse serious drug reaction;Medi Alert bracelet  is recommended  . Penicillins Anaphylaxis, Other (See Comments) and Swelling    REACTION: anaphylaxis Because of a history of documented adverse serious drug reaction;Medi Alert bracelet  is recommended    Patient Measurements: Height: 6' (182.9 cm) Weight: 250 lb 12.8 oz (113.8 kg) IBW/kg (Calculated) : 77.6 Heparin Dosing Weight: 102  Vital Signs: Temp: 98 F (36.7 C) (12/30 2047) Temp Source: Oral (12/30 2047) BP: 129/79 (12/30 2047) Pulse Rate: 70 (12/30 2047)  Labs:  Recent Labs  03/17/16 0451  03/18/16 0337 03/19/16 0720 03/19/16 1105 03/19/16 1150 03/20/16 0231  HGB  --   < > 13.2 13.3 13.2  --  12.7*  HCT  --   < > 41.1 41.6 40.5  --  39.9  PLT  --   < > 170 184 165  --  154  LABPROT 23.4*  --  20.5*  --   --  16.3* 15.8*  INR 2.05  --  1.73  --   --  1.31 1.25  HEPARINUNFRC  --   < > 0.54 0.47  --   --  0.22*  CREATININE 1.61*  --  1.49*  --  1.36*  --   --   < > = values in this interval not displayed.  Assessment: 75yo male with AFib s/p cath on 12/29. Pharmacy consulted to restart heparin bridge to coumadin. Heparin level down to subtherapeutic (0.22) on gtt at 1600 units/hr. No issues with line or bleeding reported per RN.  Goal of Therapy:  Heparin level 0.3-0.7 units/ml Monitor platelets by anticoagulation protocol: Yes   Plan:  -Increase heparin to 1800 units/hr -Will f/u 8 hr heparin level  Sherlon Handing, PharmD, BCPS Clinical pharmacist, pager [phone] 03/20/2016 3:56 AM

## 2016-03-20 NOTE — Progress Notes (Signed)
ANTICOAGULATION CONSULT NOTE - Follow Up Consult  Pharmacy Consult for heparin Indication: CAD and Afib  Labs:  Recent Labs  03/18/16 0337 03/19/16 0720 03/19/16 1105 03/19/16 1150 03/20/16 0231 03/20/16 1203 03/20/16 2123  HGB 13.2 13.3 13.2  --  12.7*  --   --   HCT 41.1 41.6 40.5  --  39.9  --   --   PLT 170 184 165  --  154  --   --   LABPROT 20.5*  --   --  16.3* 15.8*  --   --   INR 1.73  --   --  1.31 1.25  --   --   HEPARINUNFRC 0.54 0.47  --   --  0.22* 0.81* 0.50  CREATININE 1.49*  --  1.36*  --   --  1.49*  --     Assessment/Plan:  75yo male therapeutic on heparin after rate change. Will continue gtt at current rate and confirm stable with am labs.   Wynona Neat, PharmD, BCPS  03/20/2016,10:55 PM

## 2016-03-20 NOTE — Progress Notes (Signed)
Patient Name: [name] Date of Encounter: 03/20/2016  Primary Cardiologist: [facility] Problem List     Principal Problem:   Angina pectoris [facility]) Active Problems:   S/P ICD (internal cardiac defibrillator) procedure   Atrial fibrillation (HCC)   Cardiomyopathy, ischemic   Chronic systolic heart failure (HCC)     Subjective   No chest pain, complains of exertional dyspnea.  Inpatient Medications    Scheduled Meds: . allopurinol  200 mg Oral Daily  . amLODipine  2.5 mg Oral Daily  . aspirin EC  81 mg Oral Daily  . atorvastatin  10 mg Oral Daily  . carvedilol  6.25 mg Oral BID  . cholecalciferol  1,000 Units Oral Daily  . furosemide  80 mg Intravenous BID  . insulin aspart  0-15 Units Subcutaneous TID WC  . insulin aspart  0-5 Units Subcutaneous QHS  . insulin aspart  12 Units Subcutaneous TID WC  . insulin detemir  10 Units Subcutaneous BID  . ipratropium  1 spray Each Nare Daily  . losartan  50 mg Oral Daily  . pantoprazole  40 mg Oral Daily  . polyvinyl alcohol  2 drop Both Eyes Daily  . potassium chloride  20 mEq Oral BID  . Warfarin - Pharmacist Dosing Inpatient   Does not apply q1800   Continuous Infusions: . heparin 1,800 Units/hr (03/20/16 0610)   PRN Meds: acetaminophen, ALPRAZolam, gabapentin, methocarbamol, nitroGLYCERIN, ondansetron (ZOFRAN) IV, zolpidem   Vital Signs    Vitals:   03/19/16 0557 03/19/16 1200 03/19/16 2047 03/20/16 0549  BP: 122/77 (!) 137/97 129/79 121/77  Pulse: 79 78 70 73  Resp: 18 18 18 18   Temp: 98.2 F (36.8 C) 98 F (36.7 C) 98 F (36.7 C) 98.2 F (36.8 C)  TempSrc: Oral Oral Oral Oral  SpO2: 98% 98% 99% 98%  Weight: 250 lb 12.8 oz (113.8 kg)   249 lb 3.2 oz (113 kg)  Height:        Intake/Output Summary (Last 24 hours) at 03/20/16 1145 Last data filed at 03/20/16 0954  Gross per 24 hour  Intake            867.9 ml  Output             2626 ml  Net          -1758.1 ml   Filed Weights   03/18/16 0632 03/19/16 0557 03/20/16 0549  Weight: 253 lb 4.8 oz (114.9 kg) 250 lb 12.8 oz (113.8 kg) 249 lb 3.2 oz (113 kg)    Physical Exam    GEN: Well nourished, well developed, in no acute distress.  HEENT: Grossly normal.  Neck: Supple, no JVD, carotid bruits, or masses. Cardiac: Normal rate, irregular rhythm, no murmurs, rubs, or gallops. No clubbing, cyanosis, edema.   Respiratory:  Respirations regular and unlabored, clear to auscultation bilaterally. GI: Ventral wall hernia. MS: no deformity or atrophy. Skin: warm and dry, no rash. Neuro:  Strength and sensation are intact. Psych: AAOx3.  Normal affect.  Labs    CBC  Recent Labs  03/19/16 1105 03/20/16 0231  WBC 6.6 5.8  HGB 13.2 12.7*  HCT 40.5 39.9  MCV 84.0 83.8  PLT 165 [id number]   Basic Metabolic Panel  Recent Labs  03/18/16 0337 03/19/16 1105  NA 139 140  K 3.2* 3.9  CL 106 110  CO2 23 24  GLUCOSE 90 90  BUN 23* 21*  CREATININE 1.49* 1.36*  CALCIUM 8.9 8.9  Liver Function Tests No results for input(s): AST, ALT, ALKPHOS, BILITOT, PROT, ALBUMIN in the last 72 hours. No results for input(s): LIPASE, AMYLASE in the last 72 hours. Cardiac Enzymes No results for input(s): CKTOTAL, CKMB, CKMBINDEX, TROPONINI in the last 72 hours. BNP Invalid input(s): POCBNP D-Dimer No results for input(s): DDIMER in the last 72 hours. Hemoglobin A1C No results for input(s): HGBA1C in the last 72 hours. Fasting Lipid Panel No results for input(s): CHOL, HDL, LDLCALC, TRIG, CHOLHDL, LDLDIRECT in the last 72 hours. Thyroid Function Tests No results for input(s): TSH, T4TOTAL, T3FREE, THYROIDAB in the last 72 hours.  Invalid input(s): FREET3  Telemetry    A fib, PVC's - Personally Reviewed  ECG    - Personally Reviewed  Radiology    No results found.  Cardiac Studies   Cardiac cath (12/29) Conclusions: 1. Severe native coronary artery disease, including diffuse proximal LAD stenosis up to 70% and mid  LAD occlusion, 70% mid LCx disease and occlusion of OM2 and OM3, and diffusely diseased mid RCA with chronic total occlusion of the distal RCA. 2. Patent LIMA to LAD supplying small to medium-caliber distal LAD with 60% ostial LIMA stenosis. 3. Patent SVG to distal OM. Haziness in the proximal segment of the vein graft most likely represents a valve in the similar in appearance to the catheterization from 2008. 4. Patent SVG to PDA with two valves that appear similar to 2008. 5. Mildly elevated left and right heart filling pressures. 6. Mild pulmonary hypertension. 7. Reduced Fick cardiac output/index.    Echo (12/27) - Left ventricle: The cavity size was mildly dilated. There was   moderate concentric hypertrophy. Systolic function was moderately   reduced. The estimated ejection fraction was in the range of 35%   to 40%. Diffuse hypokinesis. Doppler parameters are consistent   with restrictive physiology, indicative of decreased left   ventricular diastolic compliance and/or increased left atrial   pressure. Doppler parameters are consistent with elevated   ventricular end-diastolic filling pressure. - Ventricular septum: Septal motion showed paradox. - Aortic valve: Trileaflet; normal thickness leaflets.   Transvalvular velocity was within the normal range. There was no   stenosis. There was trivial regurgitation. - Aortic root: The aortic root was mildly dilated measuring 41 mm. - Ascending aorta: The ascending aorta was normal in size. - Mitral valve: There was mild regurgitation. - Left atrium: The atrium was severely dilated. - Right ventricle: Pacer wire or catheter noted in right ventricle. - Right atrium: The atrium was moderately dilated. Pacer wire or   catheter noted in right atrium. - Tricuspid valve: There was trivial regurgitation. - Pulmonary arteries: Systolic pressure was within the normal   range. - Inferior vena cava: The vessel was normal in size. -  Pericardium, extracardiac: There was no pericardial effusion.  Impressions:  - There is no significant difference since the prior study on   05/08/14. LVEF remains moderately decreased with diffuse   hypokinesis and paradoxical septal motion.  Patient Profile     75 yo male with PMH of CAD (s/p CABG), AF, chronic systolic CHF (EF 123456 ), IDDM, polycythemia vera, Stage 3 CKD and PPM/ICD placementwho presented [facility] with ongoing, progressive dyspnea. Echo shows EF of 35-40%, consistent with prior imaging in 2016.  Assessment & Plan    1. Exertional dyspnea/CAD with CABG: Due to both acute on chronic systolic heart failure and ischemic heart disease. Will treat both. If patient continues to have significant  dyspnea, can consider PCI to ostial LIMA vs native LAD. Continue ASA, Coreg, Lipitor, and losartan.  2. Acute on chronic systolic HF: Will reduce diuretic dosage to 40 mg IV bid of Lasix. Awaiting BMET. EF 35-40%. Continue Coreg and ARB.  3. Atrial fibrillation: On IV heparin and warfarin. INR subtherapeutic at 1.25.  4. Essential HTN: Controlled. No changes.   Signed, Kate Sable, MD  03/20/2016, 11:45 AM

## 2016-03-20 NOTE — Progress Notes (Signed)
ANTICOAGULATION CONSULT NOTE - Follow Up Consult  Pharmacy Consult for heparin/warfarin Indication: chest pain/ACS and atrial fibrillation  Allergies  Allergen Reactions  . Avelox [Moxifloxacin Hcl In Nacl] Swelling, Rash and Other (See Comments)    Patient became hypotensive after infusion started Because of a history of documented adverse serious drug reaction;Medi Alert bracelet  is recommended  . Penicillins Anaphylaxis, Other (See Comments) and Swelling    REACTION: anaphylaxis Because of a history of documented adverse serious drug reaction;Medi Alert bracelet  is recommended    Patient Measurements: Height: 6' (182.9 cm) Weight: 249 lb 3.2 oz (113 kg) IBW/kg (Calculated) : 77.6 Heparin Dosing Weight: 102  Vital Signs: Temp: 97.8 F (36.6 C) (12/31 1200) Temp Source: Oral (12/31 1200) BP: 124/83 (12/31 1200) Pulse Rate: 78 (12/31 1200)  Labs:  Recent Labs  03/18/16 0337 03/19/16 0720 03/19/16 1105 03/19/16 1150 03/20/16 0231 03/20/16 1203  HGB 13.2 13.3 13.2  --  12.7*  --   HCT 41.1 41.6 40.5  --  39.9  --   PLT 170 184 165  --  154  --   LABPROT 20.5*  --   --  16.3* 15.8*  --   INR 1.73  --   --  1.31 1.25  --   HEPARINUNFRC 0.54 0.47  --   --  0.22* 0.81*  CREATININE 1.49*  --  1.36*  --   --  1.49*    Assessment: 75yo male with AFib s/p cath on 12/29. Pharmacy consulted to restart heparin bridge to coumadin. Heparin level supra-therapeutic on 1800 units/hr. No issues with line or bleeding reported per RN. CBC stable. INR 1.25, slightly down from yesterday.   Goal of Therapy:  Heparin level 0.3-0.7 units/ml Monitor platelets by anticoagulation protocol: Yes   Plan:  -Decrease heparin to 1650 units/hr -Warfarin po 2.5 mg x 1 tonight -8 hr heparin level -Daily heparin level, CBC, INR -Monitor for S&S of bleed  Angela Burke, PharmD, BCPS Pharmacy Resident Pager: [phone] 03/20/2016 12:58 PM

## 2016-03-20 NOTE — Progress Notes (Signed)
Subjective: Breathing still short with moving  No CP   Objective: Vitals:   03/19/16 0557 03/19/16 1200 03/19/16 2047 03/20/16 0549  BP: 122/77 (!) 137/97 129/79 121/77  Pulse: 79 78 70 73  Resp: 18 18 18 18   Temp: 98.2 F (36.8 C) 98 F (36.7 C) 98 F (36.7 C) 98.2 F (36.8 C)  TempSrc: Oral Oral Oral Oral  SpO2: 98% 98% 99% 98%  Weight: 250 lb 12.8 oz (113.8 kg)   249 lb 3.2 oz (113 kg)  Height:       Weight change: -1 lb 9.6 oz (-0.726 kg)  Intake/Output Summary (Last 24 hours) at 03/20/16 1140 Last data filed at 03/20/16 0954  Gross per 24 hour  Intake            867.9 ml  Output             2626 ml  Net          -1758.1 ml   Net ne 3.9 L    General: Alert, awake, oriented x3, in no acute distress Heart: Regular rate and rhythm, without murmurs, rubs, gallops.  Lungs: Rales at R base   Exemities:  Tr edema.   Neuro: Grossly intact, nonfocal.  Tele:  Afib   Lab Results: Results for orders placed or performed during the hospital encounter of 03/15/16 (from the past 24 hour(s))  Protime-INR     Status: Abnormal   Collection Time: 03/19/16 11:50 AM  Result Value Ref Range   Prothrombin Time 16.3 (H) 11.4 - 15.2 seconds   INR 1.31   Glucose, capillary     Status: None   Collection Time: 03/19/16 12:11 PM  Result Value Ref Range   Glucose-Capillary 94 65 - 99 mg/dL   Comment 1 Notify RN    Comment 2 Document in Chart   Occult blood card to lab, stool     Status: None   Collection Time: 03/19/16  1:06 PM  Result Value Ref Range   Fecal Occult Bld NEGATIVE NEGATIVE  Glucose, capillary     Status: Abnormal   Collection Time: 03/19/16  4:30 PM  Result Value Ref Range   Glucose-Capillary 105 (H) 65 - 99 mg/dL   Comment 1 Notify RN    Comment 2 Document in Chart   Glucose, capillary     Status: Abnormal   Collection Time: 03/19/16  8:46 PM  Result Value Ref Range   Glucose-Capillary 145 (H) 65 - 99 mg/dL   Comment 1 Notify RN    Comment 2 Document in Chart    Heparin level (unfractionated)     Status: Abnormal   Collection Time: 03/20/16  2:31 AM  Result Value Ref Range   Heparin Unfractionated 0.22 (L) 0.30 - 0.70 IU/mL  CBC     Status: Abnormal   Collection Time: 03/20/16  2:31 AM  Result Value Ref Range   WBC 5.8 4.0 - 10.5 K/uL   RBC 4.76 4.22 - 5.81 MIL/uL   Hemoglobin 12.7 (L) 13.0 - 17.0 g/dL   HCT 39.9 39.0 - 52.0 %   MCV 83.8 78.0 - 100.0 fL   MCH 26.7 26.0 - 34.0 pg   MCHC 31.8 30.0 - 36.0 g/dL   RDW 17.0 (H) 11.5 - 15.5 %   Platelets 154 150 - 400 K/uL  Protime-INR     Status: Abnormal   Collection Time: 03/20/16  2:31 AM  Result Value Ref Range   Prothrombin Time 15.8 (H) 11.4 -  15.2 seconds   INR 1.25   Glucose, capillary     Status: Abnormal   Collection Time: 03/20/16  5:48 AM  Result Value Ref Range   Glucose-Capillary 103 (H) 65 - 99 mg/dL   Comment 1 Notify RN    Comment 2 Document in Chart    *Note: Due to a large number of results and/or encounters for the requested time period, some results have not been displayed. A complete set of results can be found in Results Review.    Studies/Results: No results found.  Medications:Reviewed    @PROBHOSP @  1  CAD  Cath on Fri showed: 1. Significant native coronary artery disease, including diffusely diseased prox/mid RCA with distal RCA occlusion, hazy 70% proximal LAD and 100% mid LAD stenoses, and calcified 50-60% mid LCx stenosis. At least 2 OM branches are occluded. 2. Patent LIMA to LAD with 60% ostial LIMA stenosis. 3. Patent SVG to OM and SVG to PDA. Haziness in proximal SVG to OM likely represents a valve. 4. Elevated left and right heart filling pressures (mean RA 11, mean PAP 30, PCWP 19, LVEDP 21 mmHg). 5. Reduced Fick cardiac output and index (CO 5.0, CI 2.1).  RECOMMENDATIONS: 1. Medical optimization including diuresis beginning tomorrow. 2. If patient continues to have significant dyspnea, can consider PCI to ostial LIMA. 3. Aggressive  secondary prevention.    Plan for medical Rx  LVEDP was 21  Diruesis started yesterday  He has had a small response  BMET is pending re dosing of lasix   He still is SOB    B  Permanent atrila fib  Coumadin is being initiated    C  CHF    As noted above  Wait on labs    LOS: 5 days   Dorris Carnes 03/20/2016, 11:40 AM

## 2016-03-21 HISTORY — PX: PRESSURE SENSOR/CARDIOMEMS: CATH118258

## 2016-03-21 LAB — GLUCOSE, CAPILLARY
GLUCOSE-CAPILLARY: 96 mg/dL (ref 65–99)
GLUCOSE-CAPILLARY: 125 mg/dL — AB (ref 65–99)
Glucose-Capillary: 143 mg/dL — ABNORMAL HIGH (ref 65–99)
Glucose-Capillary: 120 mg/dL — ABNORMAL HIGH (ref 65–99)

## 2016-03-21 LAB — PROTIME-INR
INR: 1.23
Prothrombin Time: 15.5 seconds — ABNORMAL HIGH (ref 11.4–15.2)

## 2016-03-21 LAB — CBC
HEMATOCRIT: 39.2 % (ref 39.0–52.0)
Hemoglobin: 12.7 g/dL — ABNORMAL LOW (ref 13.0–17.0)
MCH: 27 pg (ref 26.0–34.0)
MCHC: 32.4 g/dL (ref 30.0–36.0)
MCV: 83.2 fL (ref 78.0–100.0)
Platelets: 157 10*3/uL (ref 150–400)
RBC: 4.71 MIL/uL (ref 4.22–5.81)
RDW: 17 % — ABNORMAL HIGH (ref 11.5–15.5)
WBC: 7 10*3/uL (ref 4.0–10.5)

## 2016-03-21 LAB — HEPARIN LEVEL (UNFRACTIONATED): Heparin Unfractionated: 0.38 IU/mL (ref 0.30–0.70)

## 2016-03-21 MED ORDER — WARFARIN SODIUM 5 MG PO TABS
5.0000 mg | ORAL_TABLET | Freq: Once | ORAL | Status: AC
  Administered 2016-03-21: 5 mg via ORAL
  Filled 2016-03-21: qty 1

## 2016-03-21 NOTE — Discharge Instructions (Signed)

## 2016-03-21 NOTE — Progress Notes (Signed)
Patient Name: [name] Date of Encounter: 03/21/2016     Principal Problem:   Angina pectoris ([place]) Active Problems:   S/P ICD (internal cardiac defibrillator) procedure   Atrial fibrillation ([place])   Cardiomyopathy, ischemic   Chronic systolic heart failure ([place])    SUBJECTIVE  Dyspnea improved but still some sob. Edema better. C/o right arm swelling.  CURRENT MEDS . allopurinol  200 mg Oral Daily  . amLODipine  2.5 mg Oral Daily  . aspirin EC  81 mg Oral Daily  . atorvastatin  10 mg Oral Daily  . carvedilol  6.25 mg Oral BID  . cholecalciferol  1,000 Units Oral Daily  . furosemide  40 mg Intravenous BID  . insulin aspart  0-15 Units Subcutaneous TID WC  . insulin aspart  0-5 Units Subcutaneous QHS  . insulin aspart  12 Units Subcutaneous TID WC  . insulin detemir  10 Units Subcutaneous BID  . ipratropium  1 spray Each Nare Daily  . losartan  50 mg Oral Daily  . pantoprazole  40 mg Oral Daily  . polyvinyl alcohol  2 drop Both Eyes Daily  . potassium chloride  20 mEq Oral BID  . warfarin  5 mg Oral ONCE-1800  . Warfarin - Pharmacist Dosing Inpatient   Does not apply q1800    OBJECTIVE  Vitals:   03/20/16 0549 03/20/16 1200 03/20/16 2053 03/21/16 0525  BP: 121/77 124/83 118/89 118/76  Pulse: 73 78 69 86  Resp: 18 18 18 19   Temp: 98.2 F (36.8 C) 97.8 F (36.6 C) 97.5 F (36.4 C) 98.1 F (36.7 C)  TempSrc: Oral Oral Oral Oral  SpO2: 98% 100% 98% 98%  Weight: 249 lb 3.2 oz (113 kg)   244 lb 8 oz (110.9 kg)  Height:        Intake/Output Summary (Last 24 hours) at 03/21/16 1012 Last data filed at 03/21/16 0600  Gross per 24 hour  Intake           886.53 ml  Output             1725 ml  Net          -838.47 ml   Filed Weights   03/19/16 0557 03/20/16 0549 03/21/16 0525  Weight: 250 lb 12.8 oz (113.8 kg) 249 lb 3.2 oz (113 kg) 244 lb 8 oz (110.9 kg)    PHYSICAL EXAM  General: Pleasant, 76 yo man, NAD. Neuro: Alert and oriented X 3. Moves all  extremities spontaneously. Psych: Normal affect. HEENT:  Normal  Neck: Supple without bruits or JVD. Lungs:  Resp regular and unlabored, CTA except for basilar rales. Heart: IRRR no s3, s4, or murmurs. Abdomen: Soft, non-tender, non-distended, BS + x 4.  Extremities: No clubbing, cyanosis or edema. DP/PT/Radials 2+ and equal bilaterally.  Accessory Clinical Findings  CBC  Recent Labs  03/20/16 0231 03/21/16 0436  WBC 5.8 7.0  HGB 12.7* 12.7*  HCT 39.9 39.2  MCV 83.8 83.2  PLT 154 [id number]   Basic Metabolic Panel  Recent Labs  03/19/16 1105 03/20/16 1203  NA 140 139  K 3.9 3.9  CL 110 109  CO2 24 19*  GLUCOSE 90 115*  BUN 21* 22*  CREATININE 1.36* 1.49*  CALCIUM 8.9 9.3   Liver Function Tests No results for input(s): AST, ALT, ALKPHOS, BILITOT, PROT, ALBUMIN in the last 72 hours. No results for input(s): LIPASE, AMYLASE in the last 72 hours. Cardiac Enzymes No results for input(s):  CKTOTAL, CKMB, CKMBINDEX, TROPONINI in the last 72 hours. BNP Invalid input(s): POCBNP D-Dimer No results for input(s): DDIMER in the last 72 hours. Hemoglobin A1C No results for input(s): HGBA1C in the last 72 hours. Fasting Lipid Panel No results for input(s): CHOL, HDL, LDLCALC, TRIG, CHOLHDL, LDLDIRECT in the last 72 hours. Thyroid Function Tests No results for input(s): TSH, T4TOTAL, T3FREE, THYROIDAB in the last 72 hours.  Invalid input(s): FREET3  TELE  Atrial fib with a controlled VR.  Radiology/Studies  Dg Chest 2 View  Result Date: 03/15/2016 CLINICAL DATA:  Shortness of breath for 3 days, cough, EXAM: CHEST  2 VIEW COMPARISON:  02/23/2016 FINDINGS: Cardiomegaly again noted. Cardiac pacemaker leads are unchanged in position. Status post median sternotomy. Stable left basilar scarring and pleural thickening. No definite superimposed infiltrate or pulmonary edema. IMPRESSION: Status post median sternotomy. Stable left basilar scarring and pleural thickening. No definite  superimposed infiltrate or pulmonary edema. Electronically Signed   By: Lahoma Crocker M.D.   On: 03/15/2016 13:33   Dg Chest 2 View  Result Date: 02/23/2016 CLINICAL DATA:  Cough, congestion, shortness of breath, former smoking history EXAM: CHEST  2 VIEW COMPARISON:  Chest x-ray of 05/08/2014 FINDINGS: Pleural and parenchymal opacity at the left lung base posteriorly noted on prior chest x-ray appears stable and therefore is most consistent with scarring and possible loculated fluid. Pleural plaque formation is also noted primarily on the left. The lungs remain somewhat hyperaerated with increased AP diameter suggesting and element of emphysema. Mild scarring at the right costophrenic angle also is noted and there is calcification of the hemidiaphragms which probably is asbestos related. Cardiomegaly is stable and AICD lead remains. Median sternotomy sutures are noted from prior CABG. There are degenerative changes throughout the thoracic spine. IMPRESSION: 1. Probable chronic pleural and parenchymal scarring at the left lung base. 2. Stable asbestos related changes of pleural plaques and calcified hemidiaphragms. 3. Stable cardiomegaly with AICD lead. 4. Probable emphysema. Electronically Signed   By: Ivar Drape M.D.   On: 02/23/2016 17:05    ASSESSMENT AND PLAN  1. Acute on chronic systolic/diastolic heart failure - his weight is down 5 lbs. Continue IV lasix although he is close to euvolemia 2. Chronic atrial fib - his rate is well controlled. Will follow. 3. Right arm swelling - looks like he has had some bleeding under skin from IV. No obvious infection. I asked him to keep arm elevated. 4. Weakness - he is weak. I will ask PT to see him regarding rehab as an outpatient.  [name],M.D.  03/21/2016 10:12 AMPatient ID: [name], male   DOB: 09-21-1940, 76 y.o.   MRN: [id number]

## 2016-03-21 NOTE — Progress Notes (Signed)
ANTICOAGULATION CONSULT NOTE - Follow Up Consult  Pharmacy Consult for heparin/warfarin Indication: chest pain/ACS and atrial fibrillation  Allergies  Allergen Reactions  . Avelox [Moxifloxacin Hcl In Nacl] Swelling, Rash and Other (See Comments)    Patient became hypotensive after infusion started Because of a history of documented adverse serious drug reaction;Medi Alert bracelet  is recommended  . Penicillins Anaphylaxis, Other (See Comments) and Swelling    REACTION: anaphylaxis Because of a history of documented adverse serious drug reaction;Medi Alert bracelet  is recommended    Patient Measurements: Height: 6' (182.9 cm) Weight: 244 lb 8 oz (110.9 kg) IBW/kg (Calculated) : 77.6 Heparin Dosing Weight: 102  Vital Signs: Temp: 98.1 F (36.7 C) (01/01 0525) Temp Source: Oral (01/01 0525) BP: 118/76 (01/01 0525) Pulse Rate: 86 (01/01 0525)  Labs:  Recent Labs  03/19/16 1105 03/19/16 1150 03/20/16 0231 03/20/16 1203 03/20/16 2123 03/21/16 0436  HGB 13.2  --  12.7*  --   --  12.7*  HCT 40.5  --  39.9  --   --  39.2  PLT 165  --  154  --   --  157  LABPROT  --  16.3* 15.8*  --   --  15.5*  INR  --  1.31 1.25  --   --  1.23  HEPARINUNFRC  --   --  0.22* 0.81* 0.50 0.38  CREATININE 1.36*  --   --  1.49*  --   --     Assessment: 76yo male with AFib s/p cath on 12/29. Pharmacy consulted to restart heparin bridge to coumadin.   This morning's HL remains therapeutic at 0.38 on heparin 1650 units/hr. INR remains subtherapeutic No issues with infusion or bleeding noted.   PTA Warfarin Dose: 5mg  Wed and 2.5mg  AODs  Goal of Therapy:  Heparin level 0.3-0.7 units/ml Monitor platelets by anticoagulation protocol: Yes   Plan:  Continue heparin 1650 units/hr Warfarin 5 mg tonight x1 Daily heparin level, CBC, INR Monitor for S&S of bleed   Andrey Cota. Diona Foley, PharmD, BCPS Clinical Pharmacist Pager [phone] 03/21/2016 9:39 AM

## 2016-03-22 ENCOUNTER — Encounter (HOSPITAL_COMMUNITY): Payer: Self-pay | Admitting: Internal Medicine

## 2016-03-22 LAB — BASIC METABOLIC PANEL
ANION GAP: 12 (ref 5–15)
BUN: 25 mg/dL — ABNORMAL HIGH (ref 6–20)
CO2: 23 mmol/L (ref 22–32)
Calcium: 9.5 mg/dL (ref 8.9–10.3)
Chloride: 104 mmol/L (ref 101–111)
Creatinine, Ser: 1.77 mg/dL — ABNORMAL HIGH (ref 0.61–1.24)
GFR calc Af Amer: 42 mL/min — ABNORMAL LOW (ref >60)
GFR, EST NON AFRICAN AMERICAN: 36 mL/min — AB (ref >60)
Glucose, Bld: 99 mg/dL (ref 65–99)
POTASSIUM: 3.9 mmol/L (ref 3.5–5.1)
Sodium: 139 mmol/L (ref 135–145)

## 2016-03-22 LAB — CBC
HCT: 38.7 % — ABNORMAL LOW (ref 39.0–52.0)
HEMOGLOBIN: 12.5 g/dL — AB (ref 13.0–17.0)
MCH: 26.8 pg (ref 26.0–34.0)
MCHC: 32.3 g/dL (ref 30.0–36.0)
MCV: 82.9 fL (ref 78.0–100.0)
Platelets: 168 10*3/uL (ref 150–400)
RBC: 4.67 MIL/uL (ref 4.22–5.81)
RDW: 17.3 % — AB (ref 11.5–15.5)
WBC: 7 10*3/uL (ref 4.0–10.5)

## 2016-03-22 LAB — GLUCOSE, CAPILLARY
GLUCOSE-CAPILLARY: 103 mg/dL — AB (ref 65–99)
Glucose-Capillary: 98 mg/dL (ref 65–99)
Glucose-Capillary: 147 mg/dL — ABNORMAL HIGH (ref 65–99)
Glucose-Capillary: 116 mg/dL — ABNORMAL HIGH (ref 65–99)

## 2016-03-22 LAB — HEPARIN LEVEL (UNFRACTIONATED): HEPARIN UNFRACTIONATED: 0.62 [IU]/mL (ref 0.30–0.70)

## 2016-03-22 LAB — PROTIME-INR
INR: 1.29
PROTHROMBIN TIME: 16.2 s — AB (ref 11.4–15.2)

## 2016-03-22 MED ORDER — WARFARIN SODIUM 5 MG PO TABS
5.0000 mg | ORAL_TABLET | Freq: Once | ORAL | Status: AC
Start: 2016-03-22 — End: 2016-03-22
  Administered 2016-03-22: 5 mg via ORAL
  Filled 2016-03-22: qty 1

## 2016-03-22 NOTE — Progress Notes (Signed)
Patient Name: [name] Date of Encounter: 03/22/2016  Primary Cardiologist: Dr. Marinda Elk Problem List     Principal Problem:   Angina pectoris [facility]) Active Problems:   S/P ICD (internal cardiac defibrillator) procedure   Atrial fibrillation ([facility])   Cardiomyopathy, ischemic   Chronic systolic heart failure ([place])    Subjective   No chest discomfort. Breathing is dramatically improved. More aggressive diuresis has made a significant improvement.  Inpatient Medications    Scheduled Meds: . allopurinol  200 mg Oral Daily  . amLODipine  2.5 mg Oral Daily  . aspirin EC  81 mg Oral Daily  . atorvastatin  10 mg Oral Daily  . carvedilol  6.25 mg Oral BID  . cholecalciferol  1,000 Units Oral Daily  . furosemide  40 mg Intravenous BID  . insulin aspart  0-15 Units Subcutaneous TID WC  . insulin aspart  0-5 Units Subcutaneous QHS  . insulin aspart  12 Units Subcutaneous TID WC  . insulin detemir  10 Units Subcutaneous BID  . ipratropium  1 spray Each Nare Daily  . losartan  50 mg Oral Daily  . pantoprazole  40 mg Oral Daily  . polyvinyl alcohol  2 drop Both Eyes Daily  . potassium chloride  20 mEq Oral BID  . warfarin  5 mg Oral ONCE-1800  . Warfarin - Pharmacist Dosing Inpatient   Does not apply q1800   Continuous Infusions: . heparin 1,650 Units/hr (03/22/16 0725)   PRN Meds: acetaminophen, ALPRAZolam, gabapentin, methocarbamol, nitroGLYCERIN, ondansetron (ZOFRAN) IV, zolpidem   Vital Signs    Vitals:   03/21/16 1200 03/21/16 2105 03/22/16 0609 03/22/16 1538  BP: 111/64 (!) 147/81 130/81 117/67  Pulse: 63 85 75 85  Resp: 18 18 18 18   Temp: 98.1 F (36.7 C) 97.9 F (36.6 C) 98 F (36.7 C)   TempSrc: Oral Oral Oral   SpO2: 99% 96% 98% 98%  Weight:   242 lb (109.8 kg)   Height:        Intake/Output Summary (Last 24 hours) at 03/22/16 1701 Last data filed at 03/22/16 0854  Gross per 24 hour  Intake            442.5 ml  Output             1125  ml  Net           -682.5 ml   Filed Weights   03/20/16 0549 03/21/16 0525 03/22/16 0609  Weight: 249 lb 3.2 oz (113 kg) 244 lb 8 oz (110.9 kg) 242 lb (109.8 kg)    Physical Exam   GEN: Well nourished, well developed Caucasian male appearing in no acute distress.  HEENT: Grossly normal.  Neck: Supple, no JVD, carotid bruits, or masses. Cardiac: Irregularly irregular, no murmurs, rubs, or gallops. No clubbing or cyanosis. Trace lower extremity edema.  Radials/DP/PT 2+ and equal bilaterally. Large ecchymosis right forearm and hand. There is also some component of hematoma. Respiratory:  Respirations regular and unlabored, clear to auscultation bilaterally. GI: Soft, nontender, nondistended, BS + x 4. MS: no deformity or atrophy. Skin: warm and dry, no rash. Neuro:  Strength and sensation are intact. Psych: AAOx3.  Normal affect.  Labs    CBC  Recent Labs  03/21/16 0436 03/22/16 0403  WBC 7.0 7.0  HGB 12.7* 12.5*  HCT 39.2 38.7*  MCV 83.2 82.9  PLT 157 [id number]   Basic Metabolic Panel  Recent Labs  03/20/16 1203  NA 139  K 3.9  CL 109  CO2 19*  GLUCOSE 115*  BUN 22*  CREATININE 1.49*  CALCIUM 9.3     Telemetry    Atrial fibrillation, HR in 60's - 80's. V-paced at times.  - Personally Reviewed  ECG    Atrial fibrillation, HR 75, with frequent PVC's. - Personally Reviewed  Radiology    Dg Chest 2 View  Result Date: 03/15/2016 CLINICAL DATA:  Shortness of breath for 3 days, cough, EXAM: CHEST  2 VIEW COMPARISON:  02/23/2016 FINDINGS: Cardiomegaly again noted. Cardiac pacemaker leads are unchanged in position. Status post median sternotomy. Stable left basilar scarring and pleural thickening. No definite superimposed infiltrate or pulmonary edema. IMPRESSION: Status post median sternotomy. Stable left basilar scarring and pleural thickening. No definite superimposed infiltrate or pulmonary edema. Electronically Signed   By: Lahoma Crocker M.D.   On: 03/15/2016 13:33     Cardiac Studies   Echocardiogram: 03/16/2016 Study Conclusions  - Left ventricle: The cavity size was mildly dilated. There was   moderate concentric hypertrophy. Systolic function was moderately   reduced. The estimated ejection fraction was in the range of 35%   to 40%. Diffuse hypokinesis. Doppler parameters are consistent   with restrictive physiology, indicative of decreased left   ventricular diastolic compliance and/or increased left atrial   pressure. Doppler parameters are consistent with elevated   ventricular end-diastolic filling pressure. - Ventricular septum: Septal motion showed paradox. - Aortic valve: Trileaflet; normal thickness leaflets.   Transvalvular velocity was within the normal range. There was no   stenosis. There was trivial regurgitation. - Aortic root: The aortic root was mildly dilated measuring 41 mm. - Ascending aorta: The ascending aorta was normal in size. - Mitral valve: There was mild regurgitation. - Left atrium: The atrium was severely dilated. - Right ventricle: Pacer wire or catheter noted in right ventricle. - Right atrium: The atrium was moderately dilated. Pacer wire or   catheter noted in right atrium. - Tricuspid valve: There was trivial regurgitation. - Pulmonary arteries: Systolic pressure was within the normal   range. - Inferior vena cava: The vessel was normal in size. - Pericardium, extracardiac: There was no pericardial effusion.  Impressions:  - There is no significant difference since the prior study on   05/08/14. LVEF remains moderately decreased with diffuse   hypokinesis and paradoxical septal motion.   Patient Profile     76 yo male with PMH of CAD (s/p CABG), AF, chronic systolic CHF (EF [id number] by echo in 04/2014), IDDM, polycythemia vera, Stage 3 CKD and PPM/ICD placement who presented to [facility] ED on 12/26 with ongoing, progressive dyspnea. Echo shows EF of 35-40%, consistent with prior imaging in 2016.  Coumadin on hold for cath.    Assessment & Plan    1. Dyspnea on Exertion -Improved with diuresis. His weight is down 11 pounds since 03/18/16. Will switch to oral diuretic therapy in a.m. Probable home in a.m.  2. Permanent AF - Anticoagulation is being reinstituted. INR is slowly creeping up.  3. CAD - High-grade obstruction in the LIMA graft to the LAD. Current goal is to manage symptoms with medical therapy of possible. If not, we will resort to high risk PCI on the ostial LIMA via the left radial approach.   4. Chronic systolic HF - Chronic combined systolic and diastolic heart failure with improving symptoms of dyspnea with diuresis.  5. IDDM - continue Levemir and SSI.   6. Polycythemia Vera - Hgb stable at  13.8 this AM.   7. CKD stage II -Kidney function was not reassessed today. Will go ahead and draw basic metabolic panel now and in a.m.  Signed, Sinclair Grooms, MD  03/22/2016, 5:01 PM

## 2016-03-22 NOTE — Evaluation (Signed)
Physical Therapy Evaluation Patient Details Name: [name] MRN: [id number] DOB: November 18, 1940 Today's Date: 03/22/2016   History of Present Illness  76 yo male with PMH of CAD s/p CABG, AF, chronic systolic HF, IDDM, polycythemia vera, PPM/ICD who presented to ED with ongoing, progressive dyspnea.  Clinical Impression  Patient demonstrates deficits in functional mobility as indicated below. Will need continued skilled PT to address deficits and maximize function. Will see as indicated and progress as tolerated.   OF NOTE: patient at supervision level for mobility, ambulated on room air with saturations >95% throughout, recommend continued mobility with supervision. Will follow acutely.    Follow Up Recommendations No PT follow up;Supervision for mobility/OOB    Equipment Recommendations  None recommended by PT    Recommendations for Other Services       Precautions / Restrictions Precautions Precautions: Fall Precaution Comments: watch O2 saturations      Mobility  Bed Mobility Overal bed mobility: Independent                Transfers Overall transfer level: Independent                  Ambulation/Gait Ambulation/Gait assistance: Supervision Ambulation Distance (Feet): 210 Feet Assistive device: Straight cane Gait Pattern/deviations: Step-through pattern;Decreased stride length;Trunk flexed Gait velocity: decreased Gait velocity interpretation: Below normal speed for age/gender General Gait Details: some instability noted with ambulation, improved with cane, no need for physical assist. No overt LOB noted  Stairs            Wheelchair Mobility    Modified Rankin (Stroke Patients Only)       Balance Overall balance assessment: Modified Independent                                           Pertinent Vitals/Pain Pain Assessment: No/denies pain    Home Living Family/patient expects to be discharged to:: Private  residence Living Arrangements: Spouse/significant other Available Help at Discharge: Family Type of Home: House Home Access: Stairs to enter Entrance Stairs-Rails: Right Entrance Stairs-Number of Steps: 2 Home Layout: Able to live on main level with bedroom/bathroom Home Equipment: Cane - single point      Prior Function Level of Independence: Independent with assistive device(s)         Comments: using cane     Hand Dominance   Dominant Hand: Left    Extremity/Trunk Assessment   Upper Extremity Assessment Upper Extremity Assessment: Overall WFL for tasks assessed    Lower Extremity Assessment Lower Extremity Assessment: Overall WFL for tasks assessed (hx of LE sesaory deficits in calf )    Cervical / Trunk Assessment Cervical / Trunk Assessment:  (history of back fusions)  Communication   Communication: No difficulties  Cognition Arousal/Alertness: Awake/alert Behavior During Therapy: WFL for tasks assessed/performed Overall Cognitive Status: Within Functional Limits for tasks assessed                      General Comments      Exercises     Assessment/Plan    PT Assessment Patient needs continued PT services  PT Problem List Decreased activity tolerance;Decreased balance;Cardiopulmonary status limiting activity          PT Treatment Interventions DME instruction;Gait training;Functional mobility training;Stair training;Therapeutic activities;Therapeutic exercise;Balance training;Patient/family education    PT Goals (Current goals can be found in the  Care Plan section)  Acute Rehab PT Goals Patient Stated Goal: to go home PT Goal Formulation: With patient Time For Goal Achievement: 04/05/16 Potential to Achieve Goals: Good    Frequency Min 3X/week   Barriers to discharge        Co-evaluation               End of Session Equipment Utilized During Treatment: Oxygen (initially 98% on 1 liter O2, 96% on room air) Activity  Tolerance: Patient tolerated treatment well Patient left: in bed;with call bell/phone within reach;with family/visitor present Nurse Communication: Mobility status         Time: [id number] PT Time Calculation (min) (ACUTE ONLY): 19 min   Charges:   PT Evaluation $PT Eval Moderate Complexity: 1 Procedure     PT G Codes:        Duncan Dull 05-Apr-2016, 4:43 PM [name], [place] DPT  [phone]

## 2016-03-22 NOTE — Progress Notes (Signed)
ANTICOAGULATION CONSULT NOTE - Follow Up Consult  Pharmacy Consult for heparin/warfarin Indication: chest pain/ACS and atrial fibrillation  Allergies  Allergen Reactions  . Avelox [Moxifloxacin Hcl In Nacl] Swelling, Rash and Other (See Comments)    Patient became hypotensive after infusion started Because of a history of documented adverse serious drug reaction;Medi Alert bracelet  is recommended  . Penicillins Anaphylaxis, Other (See Comments) and Swelling    REACTION: anaphylaxis Because of a history of documented adverse serious drug reaction;Medi Alert bracelet  is recommended    Patient Measurements: Height: 6' (182.9 cm) Weight: 242 lb (109.8 kg) IBW/kg (Calculated) : 77.6 Heparin Dosing Weight: 102  Vital Signs: Temp: 98 F (36.7 C) (01/02 0609) Temp Source: Oral (01/02 0609) BP: 130/81 (01/02 0609) Pulse Rate: 75 (01/02 0609)  Labs:  Recent Labs  03/19/16 1105  03/20/16 0231 03/20/16 1203 03/20/16 2123 03/21/16 0436 03/22/16 0403  HGB 13.2  --  12.7*  --   --  12.7* 12.5*  HCT 40.5  --  39.9  --   --  39.2 38.7*  PLT 165  --  154  --   --  157 168  LABPROT  --   < > 15.8*  --   --  15.5* 16.2*  INR  --   < > 1.25  --   --  1.23 1.29  HEPARINUNFRC  --   < > 0.22* 0.81* 0.50 0.38 0.62  CREATININE 1.36*  --   --  1.49*  --   --   --   < > = values in this interval not displayed.  Assessment: 76yo male with AFib s/p cath on 12/29. Pharmacy consulted to restart heparin bridge to coumadin.   This morning's HL remains therapeutic at 0.62 on heparin 1650 units/hr. INR remains subtherapeuticat 1.29 this morning.  No issues with infusion or bleeding noted. CBC stable.  INR was therapeutic on admit 03/14/16 and coumadin was held 12/26 thru 12/29 (4 days) before resumed on 03/19/16.   PTA Warfarin Dose: 5mg  Wed and 2.5mg  AODs  Goal of Therapy:  Heparin level 0.3-0.7 units/ml Monitor platelets by anticoagulation protocol: Yes   Plan:  Continue heparin 1650  units/hr Warfarin 5 mg tonight x1 Daily heparin level, CBC, INR Monitor for S&S of bleed   Nicole Cella, RPh Clinical Pharmacist Pager: [phone] 03/22/2016 10:33 AM

## 2016-03-23 ENCOUNTER — Telehealth: Payer: Self-pay | Admitting: Interventional Cardiology

## 2016-03-23 LAB — CBC
HCT: 39.1 % (ref 39.0–52.0)
Hemoglobin: 12.7 g/dL — ABNORMAL LOW (ref 13.0–17.0)
MCH: 26.9 pg (ref 26.0–34.0)
MCHC: 32.5 g/dL (ref 30.0–36.0)
MCV: 82.8 fL (ref 78.0–100.0)
PLATELETS: 166 10*3/uL (ref 150–400)
RBC: 4.72 MIL/uL (ref 4.22–5.81)
RDW: 17.1 % — AB (ref 11.5–15.5)
WBC: 6.2 10*3/uL (ref 4.0–10.5)

## 2016-03-23 LAB — BASIC METABOLIC PANEL
ANION GAP: 6 (ref 5–15)
BUN: 25 mg/dL — AB (ref 6–20)
CALCIUM: 9 mg/dL (ref 8.9–10.3)
CO2: 26 mmol/L (ref 22–32)
Chloride: 107 mmol/L (ref 101–111)
Creatinine, Ser: 1.65 mg/dL — ABNORMAL HIGH (ref 0.61–1.24)
GFR calc Af Amer: 46 mL/min — ABNORMAL LOW (ref >60)
GFR, EST NON AFRICAN AMERICAN: 39 mL/min — AB (ref >60)
GLUCOSE: 87 mg/dL (ref 65–99)
Potassium: 3.5 mmol/L (ref 3.5–5.1)
Sodium: 139 mmol/L (ref 135–145)

## 2016-03-23 LAB — GLUCOSE, CAPILLARY
GLUCOSE-CAPILLARY: 129 mg/dL — AB (ref 65–99)
Glucose-Capillary: 93 mg/dL (ref 65–99)

## 2016-03-23 LAB — HEPARIN LEVEL (UNFRACTIONATED): Heparin Unfractionated: 0.72 IU/mL — ABNORMAL HIGH (ref 0.30–0.70)

## 2016-03-23 LAB — PROTIME-INR
INR: 1.51
Prothrombin Time: 18.3 seconds — ABNORMAL HIGH (ref 11.4–15.2)

## 2016-03-23 MED ORDER — POTASSIUM CHLORIDE CRYS ER 20 MEQ PO TBCR: 20.0000 meq | EXTENDED_RELEASE_TABLET | Freq: Two times a day (BID) | ORAL | 3 refills | Status: DC

## 2016-03-23 MED ORDER — FUROSEMIDE 80 MG PO TABS: 80.0000 mg | ORAL_TABLET | Freq: Two times a day (BID) | ORAL | 3 refills | Status: DC

## 2016-03-23 MED ORDER — FUROSEMIDE 80 MG PO TABS
80.0000 mg | ORAL_TABLET | Freq: Two times a day (BID) | ORAL | Status: DC
Start: 2016-03-23 — End: 2016-03-23

## 2016-03-23 NOTE — Progress Notes (Signed)
ANTICOAGULATION CONSULT NOTE - Follow Up Consult  Pharmacy Consult for heparin Indication: chest pain/ACS and atrial fibrillation  Allergies  Allergen Reactions  . Avelox [Moxifloxacin Hcl In Nacl] Swelling, Rash and Other (See Comments)    Patient became hypotensive after infusion started Because of a history of documented adverse serious drug reaction;Medi Alert bracelet  is recommended  . Penicillins Anaphylaxis, Other (See Comments) and Swelling    REACTION: anaphylaxis Because of a history of documented adverse serious drug reaction;Medi Alert bracelet  is recommended    Patient Measurements: Height: 6' (182.9 cm) Weight: 242 lb (109.8 kg) IBW/kg (Calculated) : 77.6 Heparin Dosing Weight: 102  Vital Signs: Temp: 97.7 F (36.5 C) (01/02 2053) Temp Source: Oral (01/02 2053) BP: 123/67 (01/02 2053) Pulse Rate: 80 (01/02 2053)  Labs:  Recent Labs  03/20/16 1203  03/21/16 0436 03/22/16 0403 03/22/16 1733 03/23/16 0325 03/23/16 0326  HGB  --   < > 12.7* 12.5*  --  12.7*  --   HCT  --   --  39.2 38.7*  --  39.1  --   PLT  --   --  157 168  --  166  --   LABPROT  --   --  15.5* 16.2*  --   --  18.3*  INR  --   --  1.23 1.29  --   --  1.51  HEPARINUNFRC 0.81*  < > 0.38 0.62  --   --  0.72*  CREATININE 1.49*  --   --   --  1.77*  --   --   < > = values in this interval not displayed.  Assessment: 76yo male with AFib s/p cath on 12/29. Pharmacy consulted to restart heparin bridge to coumadin. Heparin level slightly supratherapeutic (0.72) on gtt at 1650 units/hr. No bleeding noted.  Goal of Therapy:  Heparin level 0.3-0.7 units/ml Monitor platelets by anticoagulation protocol: Yes   Plan:  Decrease heparin to 1550 units/hr Will f/u 8 hr heparin level  Sherlon Handing, PharmD, BCPS Clinical pharmacist, pager [phone] 03/23/2016 4:18 AM

## 2016-03-23 NOTE — Progress Notes (Signed)
Patient says he rested good throughout the night and hopes he is able to go home today.  Patients only concern is the severe bruising and edema to right arm which he states he has shown to his provider.

## 2016-03-23 NOTE — Discharge Summary (Signed)
Discharge Summary    Patient ID: [name],  MRN: [id number], DOB/AGE: 1940/12/26 76 y.o.  Admit date: 03/15/2016 Discharge date: 03/23/2016  Primary Care Provider: [name] Primary Cardiologist: Dr. Irish Lack  Discharge Diagnoses    Principal Problem:   Angina pectoris [facility]) Active Problems:   S/P ICD (internal cardiac defibrillator) procedure   Atrial fibrillation (HCC)   Cardiomyopathy, ischemic   Chronic systolic heart failure (HCC)   Allergies Allergies  Allergen Reactions  . Avelox [Moxifloxacin Hcl In Nacl] Swelling, Rash and Other (See Comments)    Patient became hypotensive after infusion started Because of a history of documented adverse serious drug reaction;Medi Alert bracelet  is recommended  . Penicillins Anaphylaxis, Other (See Comments) and Swelling    REACTION: anaphylaxis Because of a history of documented adverse serious drug reaction;Medi Alert bracelet  is recommended    Diagnostic Studies/Procedures    TTE: 03/16/16  Study Conclusions  - Left ventricle: The cavity size was mildly dilated. There was   moderate concentric hypertrophy. Systolic function was moderately   reduced. The estimated ejection fraction was in the range of 35%   to 40%. Diffuse hypokinesis. Doppler parameters are consistent   with restrictive physiology, indicative of decreased left   ventricular diastolic compliance and/or increased left atrial   pressure. Doppler parameters are consistent with elevated   ventricular end-diastolic filling pressure. - Ventricular septum: Septal motion showed paradox. - Aortic valve: Trileaflet; normal thickness leaflets.   Transvalvular velocity was within the normal range. There was no   stenosis. There was trivial regurgitation. - Aortic root: The aortic root was mildly dilated measuring 41 mm. - Ascending aorta: The ascending aorta was normal in size. - Mitral valve: There was mild regurgitation. - Left atrium: The  atrium was severely dilated. - Right ventricle: Pacer wire or catheter noted in right ventricle. - Right atrium: The atrium was moderately dilated. Pacer wire or   catheter noted in right atrium. - Tricuspid valve: There was trivial regurgitation. - Pulmonary arteries: Systolic pressure was within the normal   range. - Inferior vena cava: The vessel was normal in size. - Pericardium, extracardiac: There was no pericardial effusion.  Impressions:  - There is no significant difference since the prior study on   05/08/14. LVEF remains moderately decreased with diffuse   hypokinesis and paradoxical septal motion.  R/LHC: 03/18/16  Conclusions: 1. Severe native coronary artery disease, including diffuse proximal LAD stenosis up to 70% and mid LAD occlusion, 70% mid LCx disease and occlusion of OM2 and OM3, and diffusely diseased mid RCA with chronic total occlusion of the distal RCA. 2. Patent LIMA to LAD supplying small to medium-caliber distal LAD with 60% ostial LIMA stenosis. 3. Patent SVG to distal OM. Haziness in the proximal segment of the vein graft most likely represents a valve in the similar in appearance to the catheterization from 2008. 4. Patent SVG to PDA with two valves that appear similar to 2008. 5. Mildly elevated left and right heart filling pressures. 6. Mild pulmonary hypertension. 7. Reduced Fick cardiac output/index.  Recommendations: 1. Recommend avoidance of further IV hydration today and begin gentle diuresis tomorrow based on renal function. 2. If patient continues to have dyspnea despite diuresis and optimization of heart failure regimen, he may benefit from FFR/intervention to ostial LIMA. 3. With the exception of LIMA engagement/intervention, would recommend avoidance of left radial approach due to subclavian artery tortuosity and calcification for future cases. 4. Aggressive secondary prevention.  Diagnostic Diagram       _____________   History  of Present Illness     [name] is 76 yo male with PMH CAD s/p CABG, AF, chronic systolic HF, IDDM, polycythemia vera . He underwent v4 CABG in 2004, and then AAA repair later that year. EF was decreased at 25% at the time of cath, but has since recovered to 35-40%. He is also followed by Dr. Lovena Le for his PPM/ICD. He was followed by Dr. [name], and then has transitioned to Dr. Irish Lack.   Reports he has been in his usual state of health until the past couple of weeks. States he has had progressive dsypnea that he first noticed with exertion but has now progressed to symptoms with rest. Denies any worsening lower extremity edema or weight gain over the past couple of weeks. Does not report significant chest pain or discomfort, but states he did not have this prior to his CABG in 2004. Also reports some orthopnea, but feels these episodes are coming more often. Called this office stating he was very short of breath and was advised to come to the ED.   In the ED his EKG showed AF, rate controlled. Labs showed stable electrolytes, BNP 610, Hgb 13.8, INR 2.57. CXR without significant edema.   Hospital Course     Consultants: None  He was admitted and started on IV lasix, with his coumadin held in preparation for possible cardiac cath. PE was considered, but unlikely given his chronic coumadin use and therapeutic INR. Hgb remained stable. Initial dose of IV lasix did not improve his dyspnea symptoms. Repeat echo this admission showed EF of 35-45% with diffuse HK. He underwent cath on 03/18/16 showing diffusely diseased prox/mid RCA with distal RCA occlusion, hazy 70% pLAD and 100% mLAD with 55% mLCx stenosis. Also patent LIMA to LAD with 60% ostial LIMA stenosis, and patent SVG to OM, and SVG to PDA. LVEDP was 21. Plans were to optimize medical management with diuresis, and secondary prevention. Could consider PCI to ostial LIMA if dyspnea does not improve. He was resumed on his coumadin post cath. His  weight did trend downward. PT was asked to see in regards to possible rehab as an outpatient. Weight was down 11lbs, and he was switched to oral diuretic on 03/23/16. INR did improve slowly. Of note he did develop some bruising under the skin on his right arm where IV was placed, but did improve during this admission.   On 03/23/16 he was seen and assessed by Dr. Tamala Julian and determined stable for discharge home. His weight was 241lbs. He will be sent home on 80mg  PO lasix BID. He will resume his outpatient dose of coumadin and follow up for an INR check. His celebrex and chlorthalidone were held at discharge given his Cr, and the addition of lasix.  I have arrange for follow up appt in the office with an APP with labs at that time.  _____________  Discharge Vitals Blood pressure 126/71, pulse 79, temperature 98 F (36.7 C), temperature source Oral, resp. rate 18, height 6' (1.829 m), weight 241 lb 8 oz (109.5 kg), SpO2 96 %.  Filed Weights   03/21/16 0525 03/22/16 0609 03/23/16 0619  Weight: 244 lb 8 oz (110.9 kg) 242 lb (109.8 kg) 241 lb 8 oz (109.5 kg)    Labs & Radiologic Studies    CBC  Recent Labs  03/22/16 0403 03/23/16 0325  WBC 7.0 6.2  HGB 12.5* 12.7*  HCT 38.7* 39.1  MCV 82.9 82.8  PLT 168 [id number]   Basic Metabolic Panel  Recent Labs  03/22/16 1733 03/23/16 0325  NA 139 139  K 3.9 3.5  CL 104 107  CO2 23 26  GLUCOSE 99 87  BUN 25* 25*  CREATININE 1.77* 1.65*  CALCIUM 9.5 9.0   Liver Function Tests No results for input(s): AST, ALT, ALKPHOS, BILITOT, PROT, ALBUMIN in the last 72 hours. No results for input(s): LIPASE, AMYLASE in the last 72 hours. Cardiac Enzymes No results for input(s): CKTOTAL, CKMB, CKMBINDEX, TROPONINI in the last 72 hours. BNP Invalid input(s): POCBNP D-Dimer No results for input(s): DDIMER in the last 72 hours. Hemoglobin A1C No results for input(s): HGBA1C in the last 72 hours. Fasting Lipid Panel No results for input(s): CHOL, HDL,  LDLCALC, TRIG, CHOLHDL, LDLDIRECT in the last 72 hours. Thyroid Function Tests No results for input(s): TSH, T4TOTAL, T3FREE, THYROIDAB in the last 72 hours.  Invalid input(s): FREET3 _____________  Dg Chest 2 View  Result Date: 03/15/2016 CLINICAL DATA:  Shortness of breath for 3 days, cough, EXAM: CHEST  2 VIEW COMPARISON:  02/23/2016 FINDINGS: Cardiomegaly again noted. Cardiac pacemaker leads are unchanged in position. Status post median sternotomy. Stable left basilar scarring and pleural thickening. No definite superimposed infiltrate or pulmonary edema. IMPRESSION: Status post median sternotomy. Stable left basilar scarring and pleural thickening. No definite superimposed infiltrate or pulmonary edema. Electronically Signed   By: Lahoma Crocker M.D.   On: 03/15/2016 13:33   Dg Chest 2 View  Result Date: 02/23/2016 CLINICAL DATA:  Cough, congestion, shortness of breath, former smoking history EXAM: CHEST  2 VIEW COMPARISON:  Chest x-ray of 05/08/2014 FINDINGS: Pleural and parenchymal opacity at the left lung base posteriorly noted on prior chest x-ray appears stable and therefore is most consistent with scarring and possible loculated fluid. Pleural plaque formation is also noted primarily on the left. The lungs remain somewhat hyperaerated with increased AP diameter suggesting and element of emphysema. Mild scarring at the right costophrenic angle also is noted and there is calcification of the hemidiaphragms which probably is asbestos related. Cardiomegaly is stable and AICD lead remains. Median sternotomy sutures are noted from prior CABG. There are degenerative changes throughout the thoracic spine. IMPRESSION: 1. Probable chronic pleural and parenchymal scarring at the left lung base. 2. Stable asbestos related changes of pleural plaques and calcified hemidiaphragms. 3. Stable cardiomegaly with AICD lead. 4. Probable emphysema. Electronically Signed   By: Ivar Drape M.D.   On: 02/23/2016 17:05    Disposition   Pt is being discharged home today in good condition.  Follow-up Plans & Appointments    Follow-up Information    Almyra Deforest, [place] Follow up on 03/30/2016.   Specialties:  Cardiology, Radiology Why:  at 11:15am for your follow up appt.  Contact information: [address] [place] [address] [phone]          Discharge Instructions    (HEART FAILURE PATIENTS) Call MD:  Anytime you have any of the following symptoms: 1) 3 pound weight gain in 24 hours or 5 pounds in 1 week 2) shortness of breath, with or without a dry hacking cough 3) swelling in the hands, feet or stomach 4) if you have to sleep on extra pillows at night in order to breathe.    Complete by:  As directed    Call MD for:  redness, tenderness, or signs of infection (pain, swelling, redness, odor or green/yellow discharge around incision site)  Complete by:  As directed    Diet - low sodium heart healthy    Complete by:  As directed    Discharge instructions    Complete by:  As directed    Radial Site Care Refer to this sheet in the next few weeks. These instructions provide you with information on caring for yourself after your procedure. Your caregiver may also give you more specific instructions. Your treatment has been planned according to current medical practices, but problems sometimes occur. Call your caregiver if you have any problems or questions after your procedure. HOME CARE INSTRUCTIONS You may shower the day after the procedure.Remove the bandage (dressing) and gently wash the site with plain soap and water.Gently pat the site dry.  Do not apply powder or lotion to the site.  Do not submerge the affected site in water for 3 to 5 days.  Inspect the site at least twice daily.  Do not flex or bend the affected arm for 24 hours.  No lifting over 5 pounds (2.3 kg) for 5 days after your procedure.  Do not drive home if you are discharged the same day of the procedure.  Have someone else drive you.  You may drive 24 hours after the procedure unless otherwise instructed by your caregiver.  What to expect: Any bruising will usually fade within 1 to 2 weeks.  Blood that collects in the tissue (hematoma) may be painful to the touch. It should usually decrease in size and tenderness within 1 to 2 weeks.  SEEK IMMEDIATE MEDICAL CARE IF: You have unusual pain at the radial site.  You have redness, warmth, swelling, or pain at the radial site.  You have drainage (other than a small amount of blood on the dressing).  You have chills.  You have a fever or persistent symptoms for more than 72 hours.  You have a fever and your symptoms suddenly get worse.  Your arm becomes pale, cool, tingly, or numb.  You have heavy bleeding from the site. Hold pressure on the site.   Increase activity slowly    Complete by:  As directed       Discharge Medications   Current Discharge Medication List    CONTINUE these medications which have CHANGED   Details  furosemide (LASIX) 80 MG tablet Take 1 tablet (80 mg total) by mouth 2 (two) times daily. Qty: 180 tablet, Refills: 3    potassium chloride SA (K-DUR,KLOR-CON) 20 MEQ tablet Take 1 tablet (20 mEq total) by mouth 2 (two) times daily. TAKE 1.5 TWICE DAILY TO TOTAL 30 MEQ Qty: 270 tablet, Refills: 3      CONTINUE these medications which have NOT CHANGED   Details  acetaminophen (TYLENOL) 325 MG tablet Take 325 mg by mouth every 4 (four) hours as needed for moderate pain.     allopurinol (ZYLOPRIM) 100 MG tablet Take 2 tablets (200 mg total) by mouth daily. Qty: 180 tablet, Refills: 1    amLODipine (NORVASC) 5 MG tablet Take 2.5 mg by mouth daily.    aspirin 81 MG tablet Take 81 mg by mouth once a week. Take on sundays    atorvastatin (LIPITOR) 10 MG tablet Take 1 tablet (10 mg total) by mouth daily. Qty: 90 tablet, Refills: 3    carboxymethylcellulose (REFRESH PLUS) 0.5 % SOLN Place 2 drops into both eyes daily.      carvedilol (COREG) 6.25 MG tablet Take 1 tablet (6.25 mg total) by mouth 2 (two) times daily. Qty:  180 tablet, Refills: 3   Associated Diagnoses: Long term current use of anticoagulant; Chronic atrial fibrillation (HCC)    Cholecalciferol 1000 units tablet Take 1,000 Units by mouth daily.   Associated Diagnoses: Polycythemia vera (HCC)    colchicine 0.6 MG tablet Take 0.6 mg by mouth daily as needed (gout).     gabapentin (NEURONTIN) 300 MG capsule TAKE ONE CAPSULE BY MOUTH 4 TIMES DAILY AS NEEDED FOR LEG PAIN Qty: 360 capsule, Refills: 3    insulin detemir (LEVEMIR) 100 UNIT/ML injection Inject 10 Units into the skin 2 (two) times daily.    Associated Diagnoses: Polycythemia vera ([place]); History of DVT (deep vein thrombosis)    insulin regular (NOVOLIN R) 100 units/mL injection Inject 20 Units into the skin 2 (two) times daily before a meal.    Associated Diagnoses: Polycythemia vera ([place]); History of DVT (deep vein thrombosis)    ipratropium (ATROVENT) 0.06 % nasal spray Place 1 spray into both nostrils daily.     losartan (COZAAR) 50 MG tablet TAKE ONE TABLET BY MOUTH ONCE DAILY Qty: 90 tablet, Refills: 3    methocarbamol (ROBAXIN) 750 MG tablet Take 750 mg by mouth 2 (two) times daily as needed for muscle spasms.    omeprazole (PRILOSEC) 20 MG capsule Take 20 mg by mouth 2 (two) times daily before a meal.    warfarin (COUMADIN) 2.5 MG tablet TAKE ONE TABLET BY MOUTH AS DIRECTED Qty: 120 tablet, Refills: 1    ACCU-CHEK AVIVA PLUS test strip USE ONE STRIP TO CHECK GLUCOSE THREE TIMES DAILY AS DIRECTED Qty: 100 each, Refills: 3    ACCU-CHEK FASTCLIX LANCETS MISC USE ONE  TO CHECK GLUCOSE THREE TIMES DAILY Qty: 102 each, Refills: 5    nitroGLYCERIN (NITROSTAT) 0.4 MG SL tablet Place 1 tablet (0.4 mg total) under the tongue every 5 (five) minutes as needed for chest pain (MAX 3 TABLETS). Qty: 25 tablet, Refills: 3      STOP taking these medications     celecoxib  (CELEBREX) 200 MG capsule      chlorthalidone (HYGROTON) 25 MG tablet          Aspirin prescribed at discharge?  Yes High Intensity Statin Prescribed? (Lipitor 40-80mg  or Crestor 20-40mg ): No:  Beta Blocker Prescribed? Yes For EF <40%, was ACEI/ARB Prescribed? yes ADP Receptor Inhibitor Prescribed? (i.e. Plavix etc.-Includes Medically Managed Patients): No:  For EF <40%, Aldosterone Inhibitor Prescribed? No:  Was EF assessed during THIS hospitalization? Yes Was Cardiac Rehab II ordered? (Included Medically managed Patients): No:    Outstanding Labs/Studies   INR, BMET at follow up visit.   Duration of Discharge Encounter   Greater than 30 minutes including physician time.  Signed, Reino Bellis NP-C 03/23/2016, 12:01 PM   The patient has been seen in conjunction with Reino Bellis, NP-C. All aspects of care have been considered and discussed. The patient has been personally interviewed, examined, and all clinical data has been reviewed.   See my note from earlier today.  Plan is to discharge and have & day TOF.  Please note the new dry weight is 241 pounds.  Meds as listed above.

## 2016-03-23 NOTE — Progress Notes (Addendum)
Patient Name: [name] Date of Encounter: 03/23/2016  Primary Cardiologist: Dr. Marinda Elk Problem List     Principal Problem:   Angina pectoris [facility]) Active Problems:   S/P ICD (internal cardiac defibrillator) procedure   Atrial fibrillation (HCC)   Cardiomyopathy, ischemic   Chronic systolic heart failure ([place])    Subjective   No chest discomfort. Breathing is dramatically improved. More aggressive diuresis has made a significant improvement.  Inpatient Medications    Scheduled Meds: . allopurinol  200 mg Oral Daily  . amLODipine  2.5 mg Oral Daily  . aspirin EC  81 mg Oral Daily  . atorvastatin  10 mg Oral Daily  . carvedilol  6.25 mg Oral BID  . cholecalciferol  1,000 Units Oral Daily  . furosemide  40 mg Intravenous BID  . insulin aspart  0-15 Units Subcutaneous TID WC  . insulin aspart  0-5 Units Subcutaneous QHS  . insulin aspart  12 Units Subcutaneous TID WC  . insulin detemir  10 Units Subcutaneous BID  . ipratropium  1 spray Each Nare Daily  . losartan  50 mg Oral Daily  . pantoprazole  40 mg Oral Daily  . polyvinyl alcohol  2 drop Both Eyes Daily  . potassium chloride  20 mEq Oral BID  . Warfarin - Pharmacist Dosing Inpatient   Does not apply q1800   Continuous Infusions: . heparin 1,550 Units/hr (03/23/16 0554)   PRN Meds: acetaminophen, ALPRAZolam, gabapentin, methocarbamol, nitroGLYCERIN, ondansetron (ZOFRAN) IV, zolpidem   Vital Signs    Vitals:   03/22/16 0609 03/22/16 1538 03/22/16 2053 03/23/16 0619  BP: 130/81 117/67 123/67 126/71  Pulse: 75 85 80 79  Resp: 18 18 18 18   Temp: 98 F (36.7 C)  97.7 F (36.5 C) 98 F (36.7 C)  TempSrc: Oral  Oral Oral  SpO2: 98% 98% 100% 96%  Weight: 242 lb (109.8 kg)   241 lb 8 oz (109.5 kg)  Height:        Intake/Output Summary (Last 24 hours) at 03/23/16 1043 Last data filed at 03/23/16 1015  Gross per 24 hour  Intake              732 ml  Output             1375 ml  Net              -643 ml   Filed Weights   03/21/16 0525 03/22/16 0609 03/23/16 0619  Weight: 244 lb 8 oz (110.9 kg) 242 lb (109.8 kg) 241 lb 8 oz (109.5 kg)    Physical Exam   GEN: Well nourished, well developed Caucasian male appearing in no acute distress.  HEENT: Grossly normal.  Neck: Supple, no JVD, carotid bruits, or masses. Cardiac: Irregularly irregular, no murmurs, rubs, or gallops. No clubbing or cyanosis. Trace lower extremity edema.  Radials/DP/PT 2+ and equal bilaterally. Large ecchymosis right forearm and hand. There is also some component of hematoma. Respiratory:  Respirations regular and unlabored, clear to auscultation bilaterally. GI: Soft, nontender, nondistended, BS + x 4. MS: no deformity or atrophy. Skin: warm and dry, no rash. Neuro:  Strength and sensation are intact. Psych: AAOx3.  Normal affect.  Labs    CBC  Recent Labs  03/22/16 0403 03/23/16 0325  WBC 7.0 6.2  HGB 12.5* 12.7*  HCT 38.7* 39.1  MCV 82.9 82.8  PLT 168 [id number]   Basic Metabolic Panel  Recent Labs  03/22/16 1733 03/23/16 0325  NA 139 139  K 3.9 3.5  CL 104 107  CO2 23 26  GLUCOSE 99 87  BUN 25* 25*  CREATININE 1.77* 1.65*  CALCIUM 9.5 9.0     Telemetry    Atrial fibrillation, HR in 60's - 80's. V-paced at times.  - Personally Reviewed  ECG    Atrial fibrillation, HR 75, with frequent PVC's. - Personally Reviewed  Radiology    Dg Chest 2 View  Result Date: 03/15/2016 CLINICAL DATA:  Shortness of breath for 3 days, cough, EXAM: CHEST  2 VIEW COMPARISON:  02/23/2016 FINDINGS: Cardiomegaly again noted. Cardiac pacemaker leads are unchanged in position. Status post median sternotomy. Stable left basilar scarring and pleural thickening. No definite superimposed infiltrate or pulmonary edema. IMPRESSION: Status post median sternotomy. Stable left basilar scarring and pleural thickening. No definite superimposed infiltrate or pulmonary edema. Electronically Signed   By: Lahoma Crocker M.D.    On: 03/15/2016 13:33    Cardiac Studies   Echocardiogram: 03/16/2016 Study Conclusions  - Left ventricle: The cavity size was mildly dilated. There was   moderate concentric hypertrophy. Systolic function was moderately   reduced. The estimated ejection fraction was in the range of 35%   to 40%. Diffuse hypokinesis. Doppler parameters are consistent   with restrictive physiology, indicative of decreased left   ventricular diastolic compliance and/or increased left atrial   pressure. Doppler parameters are consistent with elevated   ventricular end-diastolic filling pressure. - Ventricular septum: Septal motion showed paradox. - Aortic valve: Trileaflet; normal thickness leaflets.   Transvalvular velocity was within the normal range. There was no   stenosis. There was trivial regurgitation. - Aortic root: The aortic root was mildly dilated measuring 41 mm. - Ascending aorta: The ascending aorta was normal in size. - Mitral valve: There was mild regurgitation. - Left atrium: The atrium was severely dilated. - Right ventricle: Pacer wire or catheter noted in right ventricle. - Right atrium: The atrium was moderately dilated. Pacer wire or   catheter noted in right atrium. - Tricuspid valve: There was trivial regurgitation. - Pulmonary arteries: Systolic pressure was within the normal   range. - Inferior vena cava: The vessel was normal in size. - Pericardium, extracardiac: There was no pericardial effusion.  Impressions:  - There is no significant difference since the prior study on   05/08/14. LVEF remains moderately decreased with diffuse    hypokinesis and paradoxical septal motion.   Patient Profile     76 yo male with PMH of CAD (s/p CABG), AF, chronic systolic CHF (EF [id number] by echo in 04/2014), IDDM, polycythemia vera, Stage 3 CKD and PPM/ICD placement who presented to [facility] ED on 12/26 with ongoing, progressive dyspnea. Echo shows EF of 35-40%, consistent  with prior imaging in 2016. Coumadin on hold for cath.    Assessment & Plan    1. Dyspnea on Exertion -Weight this morning is 241 pounds. He should weigh at home on his scales today to establish his dry weight. -We will resume furosemide 80 mg twice a day at home. -Needs a 7 day TOF with basic metabolic panel in 5-7 days.  2. Permanent AF - 1.51 today. Resume his usual outpatient Coumadin dose. Needs INR checked at transition of care visit in 5-7 days.  3. CAD - Notify us if any recurrent dyspnea or chest discomfort.  4. Chronic systolic HF - Chronic combined systolic and diastolic heart failure improved with more aggressive diuresis as noted above. 5-7 day TOF with  reassessment of volume status, kidney function, potassium, and INR.  5. IDDM - continue Levemir and SSI.   6. Polycythemia Vera - Hgb stable at 13.8 this AM.   7. CKD stage II -Kidney function was reassessed today and is stable.  Signed, Sinclair Grooms, MD  03/23/2016, 10:43 AM

## 2016-03-23 NOTE — Telephone Encounter (Signed)
The pts wife states that Dr Tamala Julian told her and the pt at his hosp d/c that the pt needs to f/u with Dr Irish Lack for a post hosp f/u but they scheduled the the pt to see Billey Chang, PA instead at the [place] office on 03/29/16. She wants the pt to see Dr Irish Lack or at the very least for the pt to come to the [facility] office for his post hosp f/u.  I explained to Kaii that Dr Irish Lack will be on vacation during the time that the pt needs to f/u and offered an apt with Truitt Merle, NP instead. She accepted and was pleased that the pt can see Cecille Rubin at this office. Apt scheduled for 1/10 at 11 am.

## 2016-03-23 NOTE — Progress Notes (Signed)
Physical Therapy Treatment Patient Details Name: [name] MRN: [id number] DOB: Sep 12, 1940 Today's Date: 03/23/2016    History of Present Illness 76 yo male with PMH of CAD s/p CABG, AF, chronic systolic HF, IDDM, polycythemia vera, PPM/ICD who presented to ED with ongoing, progressive dyspnea.    PT Comments    Patient seen for mobility progression ambulated increased distance on room air with VSS. Tolerated stair negotiation and was receptive to education [id number] conservation and mobility expectations. Will continue to see and progress as tolerated.  Follow Up Recommendations  No PT follow up;Supervision for mobility/OOB     Equipment Recommendations  None recommended by PT    Recommendations for Other Services       Precautions / Restrictions Precautions Precautions: Fall Precaution Comments: watch O2 saturations Restrictions Weight Bearing Restrictions: No    Mobility  Bed Mobility Overal bed mobility: Independent                Transfers Overall transfer level: Independent                  Ambulation/Gait Ambulation/Gait assistance: Supervision Ambulation Distance (Feet): 410 Feet Assistive device: Straight cane Gait Pattern/deviations: Step-through pattern;Decreased stride length;Trunk flexed Gait velocity: decreased Gait velocity interpretation: Below normal speed for age/gender General Gait Details: 3 standing rest breaks due to right hip and low back pain   Stairs Stairs: Yes   Stair Management: One rail Right;With cane Number of Stairs: 4 General stair comments: no physical assist required, supervision for safety  Wheelchair Mobility    Modified Rankin (Stroke Patients Only)       Balance Overall balance assessment: Modified Independent                                  Cognition Arousal/Alertness: Awake/alert Behavior During Therapy: WFL for tasks assessed/performed Overall Cognitive Status: Within  Functional Limits for tasks assessed                      Exercises      General Comments General comments (skin integrity, edema, etc.): educated on energy conservation for home management as well as mobility expectations upon discharge      Pertinent Vitals/Pain Pain Assessment: Faces Faces Pain Scale: Hurts little more Pain Location: back and hip Pain Descriptors / Indicators: Discomfort;Guarding;Sore Pain Intervention(s): Monitored during session;Limited activity within patient's tolerance    Home Living                      Prior Function            PT Goals (current goals can now be found in the care plan section) Acute Rehab PT Goals Patient Stated Goal: to go home PT Goal Formulation: With patient Time For Goal Achievement: 04/05/16 Potential to Achieve Goals: Good Progress towards PT goals: Progressing toward goals    Frequency    Min 3X/week      PT Plan Current plan remains appropriate    Co-evaluation             End of Session   Activity Tolerance: Patient tolerated treatment well Patient left: in bed;with call bell/phone within reach;with family/visitor present     Time: 1131-1149 PT Time Calculation (min) (ACUTE ONLY): 18 min  Charges:  $Gait Training: 8-22 mins  G Codes:      Duncan Dull 03/23/2016, 11:53 AM [name], PT DPT  [phone]

## 2016-03-23 NOTE — Telephone Encounter (Signed)
New Message:    Pt have been in the hospital for 9 days and [name] says she needs to talk to you. If not at [facility] call her on her cell phone.[phone].

## 2016-03-23 NOTE — Care Management Important Message (Signed)
Important Message  Patient Details  Name: [name] MRN: [id number] Date of Birth: 13-Apr-1940   Medicare Important Message Given:  Yes    [name] 03/23/2016, 11:05 AM

## 2016-03-24 ENCOUNTER — Telehealth: Payer: Self-pay | Admitting: *Deleted

## 2016-03-24 NOTE — Telephone Encounter (Signed)
Pt was on tcm list admitted for Angina pectoris. Pt was d/c on 03/23/16, and will follow-up w/cardiology Almyra Deforest, PA on 03/30/2016...Johny Chess

## 2016-03-30 ENCOUNTER — Ambulatory Visit (INDEPENDENT_AMBULATORY_CARE_PROVIDER_SITE_OTHER): Payer: Medicare Other | Admitting: Nurse Practitioner

## 2016-03-30 ENCOUNTER — Ambulatory Visit (INDEPENDENT_AMBULATORY_CARE_PROVIDER_SITE_OTHER)
Admission: RE | Admit: 2016-03-30 | Discharge: 2016-03-30 | Disposition: A | Discharge status: Home / Self Care | Payer: Medicare Other | Source: Ambulatory Visit | Attending: Nurse Practitioner | Admitting: Nurse Practitioner

## 2016-03-30 ENCOUNTER — Telehealth: Payer: Self-pay

## 2016-03-30 ENCOUNTER — Ambulatory Visit (INDEPENDENT_AMBULATORY_CARE_PROVIDER_SITE_OTHER): Payer: Medicare Other | Admitting: *Deleted

## 2016-03-30 ENCOUNTER — Ambulatory Visit (HOSPITAL_COMMUNITY)
Admission: RE | Admit: 2016-03-30 | Discharge: 2016-03-30 | Disposition: A | Discharge status: Home / Self Care | Payer: Medicare Other | Source: Ambulatory Visit | Attending: Cardiovascular Disease | Admitting: Cardiovascular Disease

## 2016-03-30 ENCOUNTER — Encounter: Payer: Self-pay | Admitting: Nurse Practitioner

## 2016-03-30 ENCOUNTER — Ambulatory Visit: Payer: Medicare Other | Admitting: Physician Assistant

## 2016-03-30 VITALS — BP 108/70 | Ht 72.0 in | Wt 247.8 lb

## 2016-03-30 DIAGNOSIS — I482 Chronic atrial fibrillation, unspecified: Secondary | ICD-10-CM

## 2016-03-30 DIAGNOSIS — M7989 Other specified soft tissue disorders: Secondary | ICD-10-CM | POA: Insufficient documentation

## 2016-03-30 DIAGNOSIS — I5022 Chronic systolic (congestive) heart failure: Secondary | ICD-10-CM | POA: Insufficient documentation

## 2016-03-30 DIAGNOSIS — Z7901 Long term (current) use of anticoagulants: Secondary | ICD-10-CM

## 2016-03-30 DIAGNOSIS — I255 Ischemic cardiomyopathy: Secondary | ICD-10-CM

## 2016-03-30 DIAGNOSIS — Z95810 Presence of automatic (implantable) cardiac defibrillator: Secondary | ICD-10-CM | POA: Diagnosis not present

## 2016-03-30 DIAGNOSIS — I4891 Unspecified atrial fibrillation: Secondary | ICD-10-CM | POA: Diagnosis not present

## 2016-03-30 LAB — POCT INR: INR: 2

## 2016-03-30 NOTE — Telephone Encounter (Signed)
Informed DPR that preliminary doppler reading was negative for clot. She understands to elevate arm and to see back next week as planned.

## 2016-03-30 NOTE — Progress Notes (Addendum)
CARDIOLOGY OFFICE NOTE  Date:  03/30/2016    [name] Date of Birth: 03/23/1940 Medical Record [id number]  PCP:  [name], MD  Cardiologist:  [facility]  Chief Complaint  Patient presents with  . Coronary Artery Disease    TOC visit - seen for Dr. Irish Lack    History of Present Illness: [name] is a 76 y.o. male who presents today for a TOC(7)/post hospital visit. Seen for Dr. Irish Lack.   He has a PMH CAD s/p CABG, AF, chronic systolic HF, IDDM, polycythemia vera . He underwent v4 CABG in 2004, and then AAA repair later that year. EF was decreased at 25% at the time of cath, but has since recovered to 35-40%. He is also followed by Dr. Lovena Le for his PPM/ICD. He was followed by Dr. [name], and then has transitioned to Dr. Irish Lack.   He had been in his usual state of health until the past couple of weeks. States he has had progressive dsypnea that he first noticed with exertion but has now progressed to symptoms with rest. Denies any worsening lower extremity edema or weight gain over the past couple of weeks. Does not report significant chest pain or discomfort, but states he did not have this prior to his CABG in 2004. Also reports some orthopnea, but feels these episodes are coming more often. Called this office stating he was very short of breath and was advised to come to the ED.   In the ED his EKG showed AF, rate controlled. Labs showed stable electrolytes, BNP 610, Hgb 13.8, INR 2.57. CXR without significant edema.   He was admitted and started on IV lasix, with his coumadin held in preparation for possible cardiac cath. PE was considered, but unlikely given his chronic coumadin use and therapeutic INR. Hgb remained stable. Initial dose of IV lasix did not improve his dyspnea symptoms. Repeat echo this admission showed EF of 35-45% with diffuse HK. He underwent cath on 03/18/16 showing diffusely diseased prox/mid RCA with distal RCA occlusion, hazy 70%  pLAD and 100% mLAD with 55% mLCx stenosis. Also patent LIMA to LAD with 60% ostial LIMA stenosis, and patent SVG to OM, and SVG to PDA. LVEDP was 21. Plans were to optimize medical management with diuresis, and secondary prevention. Could consider PCI to ostial LIMA if dyspnea does not improve. He was resumed on his coumadin post cath. His weight did trend downward.  Weight was down 11lbs, and he was switched to oral diuretic on 03/23/16. INR did improve slowly. Of note he did develop some bruising under the skin on his right arm where IV was placed, but the chart notes this did improve during this admission.   On 03/23/16 he was seen and assessed by Dr. Tamala Julian and determined stable for discharge home. His weight was 241lbs. He was sent home on 80mg  PO lasix BID. He will resume his outpatient dose of coumadin and follow up for an INR check. His celebrex and chlorthalidone were held at discharge given his CR, and the addition of lasix.   Comes in today. Here with his wife. He has been home a week. He feels "a thousand times better". Weight has been 240 at home all week. His breathing has improved. He has never had chest pain. His biggest issue is that of progressive left arm swelling/bruising/tenderness. He is worried that he has a blood clot. His device is on the right side. He had several IVs to the right  arm. He was cathed from the left radial and groin - those sites are fine. They both are frustrated in that they "told 3 people about his arm and no one seemed to care". He is asking about going back on Celebrex. Needs INR today along with labs. INR was not therapeutic when he left a week ago.   Past Medical History:  Diagnosis Date  . AAA (abdominal aortic aneurysm) [facility])    Surgical repair  . Adenomatous colon polyp   . AICD (automatic cardioverter/defibrillator) present   . Alcohol ingestion of more than four drinks per week    Excess beer  not a dependency problem  . Aortic valve sclerosis   .  Arthritis   . Ascending aortic aneurysm ([place]) 05/10/2014  . Atrial fibrillation ([place])    Previous long-term amiodarone therapy with multiple cardioversions / amiodarone stopped September, 2009  . Atrial flutter [facility])    Started November, 2010, Left-sided and cannot ablate  . Bony abnormality    Patient's manubrium is slightly displaced to the right  . CAD (coronary artery disease)    Catheterization July, 2008... name and vein grafts patent but low cardiac output  . Cardiomyopathy    Ischemic... ICD  . Carotid artery disease ([place])    Doppler, December, 2013, 0-39% bilateral  . Chronotropic incompetence    IV pacing rate adjusted  . Combined systolic and diastolic CHF   . COPD (chronic obstructive pulmonary disease) ([place])   . Discolored skin   . Diverticulosis   . Drug therapy    Redness and swelling with Avelox infusion May 24, 2011  . Ejection fraction    Ejection fraction has varied over time from 35-50%.,, Echoes are technically very difficult,,, EF 50%, echo, May 25, 2011, technically very difficult  . Eye abnormality    Ophthalmologist questions a clot in one of his eyes, May, 2012  . Gout   . Hyperlipidemia   . Hypertension   . Internal hemorrhoids   . Left atrial thrombus    Remote past... cardioversions done since that time  . Left ventricular ejection fraction less than 40%   . Mitral regurgitation    Mild echo  . Myocardial infarction   . Nasal drainage    Chronic  . Overweight(278.02)    November, 2012  . Pericardial effusion   . Pleural effusion    Large loculated effusion on the left side November, 2011. This was tapped. It was exudative. Cytology revealed no cancer no proof of mesothelioma area pulmonary team felt that no further workup was needed  . Pleural thickening   . Pneumonia   . Polycythemia vera ([place]) 07/28/2014  . Presence of permanent cardiac pacemaker   . SOB (shortness of breath)    Large left effusion/ thoracentesis/hospitalization/November,  2011... exudated.. cytology negative.. Dr.Wert.. no proof of mesothelioma  . Spinal stenosis    Surgery Dr.Elsner  . Thrombophlebitis of superficial veins of upper extremities    Possible venous stenosis from defibrillator  . Type II diabetes mellitus ([place])   . Venous insufficiency    Toe discoloration chronic  . Ventral hernia    April, 2014, result of his abdominal surgery  . Warfarin anticoagulation   . Wide-complex tachycardia ([place])     Past Surgical History:  Procedure Laterality Date  . ABDOMINAL AORTIC ANEURYSM REPAIR  11/2002  . ANTERIOR CERVICAL DECOMP/DISCECTOMY FUSION  1995  . BACK SURGERY    . BI-VENTRICULAR PACEMAKER INSERTION (CRT-P)  02-11-2013   Pt with previously  implanted MDT CRTD downgraded to CRTP by Dr Lovena Le 02-11-13  . CARDIAC CATHETERIZATION  "several"  . CARDIAC CATHETERIZATION N/A 03/18/2016   Procedure: RIGHT/LEFT HEART CATH AND CORONARY/GRAFT ANGIOGRAPHY;  Surgeon: Nelva Bush, MD;  Location: [facility];  Service: Cardiovascular;  Laterality: N/A;  . CATARACT EXTRACTION W/ INTRAOCULAR LENS  IMPLANT, BILATERAL    . COLONOSCOPY W/ POLYPECTOMY    . CORONARY ANGIOPLASTY WITH STENT PLACEMENT  2004  . CORONARY ARTERY BYPASS GRAFT  2004   CABG X4  . FRACTURE SURGERY    . IMPLANTABLE CARDIOVERTER DEFIBRILLATOR (ICD) GENERATOR CHANGE N/A 02/11/2013   Procedure: ICD GENERATOR CHANGE;  Surgeon: Evans Lance, MD;  Location: [facility] CATH LAB;  Service: Cardiovascular;  Laterality: N/A;  . INCISION AND DRAINAGE ABSCESS / HEMATOMA OF BURSA / KNEE / THIGH    . INSERT / REPLACE / REMOVE PACEMAKER  2009   original pacer/defibrillator; Dr. Lovena Le 2009... by the pacing  . LUMBAR FUSION  2010  . ORIF TIBIA & FIBULA FRACTURES Right 2000s  . PILONIDAL CYST EXCISION    . SPINAL CORD STIMULATOR IMPLANT  12/2011  . SURGERY SCROTAL / TESTICULAR       Medications: Current Outpatient Prescriptions  Medication Sig Dispense Refill  . ACCU-CHEK AVIVA PLUS test strip  USE ONE STRIP TO CHECK GLUCOSE THREE TIMES DAILY AS DIRECTED 100 each 3  . ACCU-CHEK FASTCLIX LANCETS MISC USE ONE  TO CHECK GLUCOSE THREE TIMES DAILY 102 each 5  . acetaminophen (TYLENOL) 325 MG tablet Take 325 mg by mouth every 4 (four) hours as needed for moderate pain.     Marland Kitchen allopurinol (ZYLOPRIM) 100 MG tablet Take 2 tablets (200 mg total) by mouth daily. 180 tablet 1  . amLODipine (NORVASC) 5 MG tablet Take 2.5 mg by mouth daily.    Marland Kitchen aspirin 81 MG tablet Take 81 mg by mouth once a week. Take on sundays    . atorvastatin (LIPITOR) 10 MG tablet Take 1 tablet (10 mg total) by mouth daily. 90 tablet 3  . carboxymethylcellulose (REFRESH PLUS) 0.5 % SOLN Place 2 drops into both eyes daily.     . carvedilol (COREG) 6.25 MG tablet Take 1 tablet (6.25 mg total) by mouth 2 (two) times daily. 180 tablet 3  . Cholecalciferol 1000 units tablet Take 1,000 Units by mouth daily.    . colchicine 0.6 MG tablet Take 0.6 mg by mouth daily as needed (gout).     . furosemide (LASIX) 80 MG tablet Take 1 tablet (80 mg total) by mouth 2 (two) times daily. 180 tablet 3  . gabapentin (NEURONTIN) 300 MG capsule TAKE ONE CAPSULE BY MOUTH 4 TIMES DAILY AS NEEDED FOR LEG PAIN (Patient taking differently: TAKE ONE CAPSULE BY MOUTH 5 TIMES DAILY AS NEEDED FOR LEG PAIN) 360 capsule 3  . insulin detemir (LEVEMIR) 100 UNIT/ML injection Inject 10 Units into the skin 2 (two) times daily.     . insulin regular (NOVOLIN R) 100 units/mL injection Inject 20 Units into the skin 2 (two) times daily before a meal.     . ipratropium (ATROVENT) 0.06 % nasal spray Place 1 spray into both nostrils daily.     Marland Kitchen losartan (COZAAR) 50 MG tablet TAKE ONE TABLET BY MOUTH ONCE DAILY 90 tablet 3  . methocarbamol (ROBAXIN) 750 MG tablet Take 750 mg by mouth 2 (two) times daily as needed for muscle spasms.    . nitroGLYCERIN (NITROSTAT) 0.4 MG SL tablet Place 1 tablet (0.4 mg  total) under the tongue every 5 (five) minutes as needed for chest pain  (MAX 3 TABLETS). 25 tablet 3  . omeprazole (PRILOSEC) 20 MG capsule Take 20 mg by mouth 2 (two) times daily before a meal.    . potassium chloride SA (K-DUR,KLOR-CON) 20 MEQ tablet Take 30 mEq by mouth 2 (two) times daily.    Marland Kitchen warfarin (COUMADIN) 2.5 MG tablet TAKE ONE TABLET BY MOUTH AS DIRECTED (Patient taking differently: Takes 2.5 mg daily except 5mg  on Wedneday.) 120 tablet 1   No current facility-administered medications for this visit.     Allergies: Allergies  Allergen Reactions  . Avelox [Moxifloxacin Hcl In Nacl] Swelling, Rash and Other (See Comments)    Patient became hypotensive after infusion started Because of a history of documented adverse serious drug reaction;Medi Alert bracelet  is recommended  . Penicillins Anaphylaxis, Other (See Comments) and Swelling    REACTION: anaphylaxis Because of a history of documented adverse serious drug reaction;Medi Alert bracelet  is recommended    Social History: The patient  reports that he quit smoking about 21 years ago. His smoking use included Cigarettes. He started smoking about 59 years ago. He has a 117.00 pack-year smoking history. He has never used smokeless tobacco. He reports that he drinks about 1.2 oz of alcohol per week . He reports that he does not use drugs.   Family History: The patient's family history includes Coronary artery disease in his father; Diabetes in his brother and father; Hypertension in his mother; Other in his father; Stroke in his mother.   Review of Systems: Please see the history of present illness.   Otherwise, the review of systems is positive for none.   All other systems are reviewed and negative.   Physical Exam: VS:  BP 108/70   Ht 6' (1.829 m)   Wt 247 lb 12.8 oz (112.4 kg)   BMI 33.61 kg/m  .  BMI Body mass index is 33.61 kg/m.  Wt Readings from Last 3 Encounters:  03/30/16 247 lb 12.8 oz (112.4 kg)  03/23/16 241 lb 8 oz (109.5 kg)  03/09/16 257 lb (116.6 kg)    General:  Pleasant. He is alert and in no acute distress.   HEENT: Normal.  Neck: Supple, no JVD, carotid bruits, or masses noted.  Cardiac: Irregular irregular rhythm. Rate ok. No murmurs, rubs, or gallops. No edema.  Respiratory:  Lungs are clear to auscultation bilaterally with normal work of breathing.  GI: Soft and nontender.  MS: No deformity or atrophy. Gait and ROM intact.  Skin: Warm and dry. Color is normal.  Neuro:  Strength and sensation are intact and no gross focal deficits noted.  Psych: Alert, appropriate and with normal affect. He has excessive bruising/swelling and tenderness along the right entire arm - tenderness more localized to the right forearm. Bruising extends up to the shoulder. ICD is on the right - prominent veins around the device noted. The right arm is clearly larger than the left arm.        LABORATORY DATA:  EKG:  EKG is not ordered today.  Lab Results  Component Value Date   WBC 6.2 03/23/2016   HGB 12.7 (L) 03/23/2016   HCT 39.1 03/23/2016   PLT 166 03/23/2016   GLUCOSE 87 03/23/2016   CHOL 124 03/16/2016   TRIG 95 03/16/2016   HDL 41 03/16/2016   LDLCALC 64 03/16/2016   ALT 13 (L) 03/16/2016   AST 17 03/16/2016   NA  139 03/23/2016   K 3.5 03/23/2016   CL 107 03/23/2016   CREATININE 1.65 (H) 03/23/2016   BUN 25 (H) 03/23/2016   CO2 26 03/23/2016   TSH 0.59 10/07/2014   PSA 0.33 01/07/2010   INR 2.0 03/30/2016   HGBA1C 6.3 (H) 03/15/2016   MICROALBUR 1.2 10/12/2015   Lab Results  Component Value Date   INR 2.0 03/30/2016   INR 1.51 03/23/2016   INR 1.29 03/22/2016   PROTIME 16.7 09/08/2008   PROTIME 18.5 08/25/2008    Lab Results  Component Value Date   CKTOTAL 53 07/08/2013   CKMB 1.6 05/25/2011   TROPONINI <0.03 03/16/2016     BNP (last 3 results)  Recent Labs  03/15/16 1314  BNP 610.8*    ProBNP (last 3 results) No results for input(s): PROBNP in the last 8760 hours.   Other Studies Reviewed Today:  TTE:  03/16/16  Study Conclusions  - Left ventricle: The cavity size was mildly dilated. There was moderate concentric hypertrophy. Systolic function was moderately reduced. The estimated ejection fraction was in the range of 35% to 40%. Diffuse hypokinesis. Doppler parameters are consistent with restrictive physiology, indicative of decreased left ventricular diastolic compliance and/or increased left atrial pressure. Doppler parameters are consistent with elevated ventricular end-diastolic filling pressure. - Ventricular septum: Septal motion showed paradox. - Aortic valve: Trileaflet; normal thickness leaflets. Transvalvular velocity was within the normal range. There was no stenosis. There was trivial regurgitation. - Aortic root: The aortic root was mildly dilated measuring 41 mm. - Ascending aorta: The ascending aorta was normal in size. - Mitral valve: There was mild regurgitation. - Left atrium: The atrium was severely dilated. - Right ventricle: Pacer wire or catheter noted in right ventricle. - Right atrium: The atrium was moderately dilated. Pacer wire or catheter noted in right atrium. - Tricuspid valve: There was trivial regurgitation. - Pulmonary arteries: Systolic pressure was within the normal range. - Inferior vena cava: The vessel was normal in size. - Pericardium, extracardiac: There was no pericardial effusion.  Impressions:  - There is no significant difference since the prior study on 05/08/14. LVEF remains moderately decreased with diffuse hypokinesis and paradoxical septal motion.  R/LHC: 03/18/16  Conclusions: 1. Severe native coronary artery disease, including diffuse proximal LAD stenosis up to 70% and mid LAD occlusion, 70% mid LCx disease and occlusion of OM2 and OM3, and diffusely diseased mid RCA with chronic total occlusion of the distal RCA. 2. Patent LIMA to LAD supplying small to medium-caliber distal LAD with 60%  ostial LIMA stenosis. 3. Patent SVG to distal OM. Haziness in the proximal segment of the vein graft most likely represents a valve in the similar in appearance to the catheterization from 2008. 4. Patent SVG to PDA with two valves that appear similar to 2008. 5. Mildly elevated left and right heart filling pressures. 6. Mild pulmonary hypertension. 7. Reduced Fick cardiac output/index.  Recommendations: 1. Recommend avoidance of further IV hydration today and begin gentle diuresis tomorrow based on renal function. 2. If patient continues to have dyspnea despite diuresis and optimization of heart failure regimen, he may benefit from FFR/intervention to ostial LIMA. 3. With the exception of LIMA engagement/intervention, would recommend avoidance of left radial approach due to subclavian artery tortuosity and calcification for future cases. 4. Aggressive secondary prevention.   Assessment/Plan: 1. Acute on chronic systolic HF - I almost wonder if the Celebrex was the trigger - I would NOT restart this. He is better clinically from  this standpoint.   2. Post cardiac cath - breathing has improved - I have left him on his current regimen for now. Would hold on FFR/PCI - will get follow up with Dr. Irish Lack for further discussion.   3. Right arm swelling - subsequently seen with Dr. Burt Knack - INR is checked and is 2.0 today. Will arrange for CT of the right arm - noncontrast and venous doppler of the right arm later this afternoon. Further disposition to follow. Will need to elevate the right arm regardless. Dr. Burt Knack noted that with a doppler of the right wrist, his radial artery is open.   4. CKD - lab today. Would avoid Celebrex  5. Chronic pain - will need to see PCP for further discussion. He was to have some type of spinal injection next week and hold his coumadin - I have advised to hold off on this for now.      Current medicines are reviewed with the patient today.  The patient does  not have concerns regarding medicines other than what has been noted above.  The following changes have been made:  See above.  Labs/ tests ordered today include:    Orders Placed This Encounter  Procedures  . CT Extrem Up Entire Arm R WO/CM  . Basic metabolic panel  . Pro b natriuretic peptide (BNP)  . CBC  . Hepatic function panel     Disposition:   FU with Dr. Irish Lack next week with INR check. Further disposition to follow based on studies being performed today.   Patient is agreeable to this plan and will call if any problems develop in the interim.   Signed: Burtis Junes, RN, ANP-C 03/30/2016 12:00 PM  [facility] [address] [place], [place]  [address] Phone: [phone] Fax: [phone]      Addendum: CT of the arm with no focal hematoma, does have some nonspecific subcu edema. Reviewed with Dr. Burt Knack - will continue with recommendation to elevate the arm. See what the duplex shows. His INR was therapeutic today so if DVT noted - he is now anticoagulated.

## 2016-03-30 NOTE — Patient Instructions (Addendum)
We will be checking the following labs today - BMET, CBC, Pro BNP and HPF  INR done today - stay on your current dose of Coumadin  Medication Instructions:    Continue with your current medicines. BUT  I would NOT resume Celebrex  I would discuss with your PCP about pain medicine options     Testing/Procedures To Be Arranged:  CT of the right arm - non contrast  Upper extremity venous doppler on the right this afternoon  Follow-Up:   See Dr. Irish Lack on January 17th - the office will be calling you with the exact time.   See your PCP as planned to discuss pain medicine options.     Other Special Instructions:   N/A    If you need a refill on your cardiac medications before your next appointment, please call your pharmacy.   Call the [place] office at [phone] if you have any questions, problems or concerns.

## 2016-03-31 ENCOUNTER — Encounter: Payer: Self-pay | Admitting: Internal Medicine

## 2016-03-31 LAB — CBC
Hematocrit: 41.6 % (ref 37.5–51.0)
Hemoglobin: 13.2 g/dL (ref 13.0–17.7)
MCH: 27 pg (ref 26.6–33.0)
MCHC: 31.7 g/dL (ref 31.5–35.7)
MCV: 85 fL (ref 79–97)
Platelets: 211 10*3/uL (ref 150–379)
RBC: 4.89 x10E6/uL (ref 4.14–5.80)
RDW: 17.8 % — ABNORMAL HIGH (ref 12.3–15.4)
WBC: 7.2 10*3/uL (ref 3.4–10.8)

## 2016-03-31 LAB — HEPATIC FUNCTION PANEL
ALT: 11 IU/L (ref 0–44)
AST: 12 IU/L (ref 0–40)
Albumin: 4 g/dL (ref 3.5–4.8)
Alkaline Phosphatase: 89 IU/L (ref 39–117)
Bilirubin Total: 0.9 mg/dL (ref 0.0–1.2)
Bilirubin, Direct: 0.25 mg/dL (ref 0.00–0.40)
Total Protein: 6.1 g/dL (ref 6.0–8.5)

## 2016-03-31 LAB — BASIC METABOLIC PANEL
BUN/Creatinine Ratio: 20 (ref 10–24)
BUN: 35 mg/dL — ABNORMAL HIGH (ref 8–27)
CO2: 21 mmol/L (ref 18–29)
Calcium: 9.1 mg/dL (ref 8.6–10.2)
Chloride: 102 mmol/L (ref 96–106)
Creatinine, Ser: 1.77 mg/dL — ABNORMAL HIGH (ref 0.76–1.27)
GFR calc Af Amer: 43 mL/min/{1.73_m2} — ABNORMAL LOW (ref >59)
GFR calc non Af Amer: 37 mL/min/{1.73_m2} — ABNORMAL LOW (ref >59)
Glucose: 134 mg/dL — ABNORMAL HIGH (ref 65–99)
Potassium: 4.4 mmol/L (ref 3.5–5.2)
Sodium: 144 mmol/L (ref 134–144)

## 2016-03-31 LAB — PRO B NATRIURETIC PEPTIDE: NT-Pro BNP: 2275 pg/mL — ABNORMAL HIGH (ref 0–376)

## 2016-03-31 MED ORDER — TRAMADOL HCL 50 MG PO TABS: 50.0000 mg | ORAL_TABLET | Freq: Three times a day (TID) | ORAL | 0 refills | Status: DC | PRN

## 2016-04-04 ENCOUNTER — Encounter: Payer: Self-pay | Admitting: Gastroenterology

## 2016-04-06 ENCOUNTER — Ambulatory Visit: Payer: Medicare Other | Admitting: Interventional Cardiology

## 2016-04-06 ENCOUNTER — Encounter: Payer: Self-pay | Admitting: Physician Assistant

## 2016-04-06 NOTE — Progress Notes (Deleted)
Cardiology Office Note    Date:  04/06/2016  ID:  [name], DOB 1940/11/28, MRN [id number] PCP:  [name] Rail, MD  Cardiologist:  Dr. Irish [name] (previously [name])   Chief Complaint: f/u CHF  History of Present Illness:  [name] is a 76 y.o. male with history of CAD s/p CABG in 2004 with AAA repair later that year, permanent atrial fib/flutter, chronic systolic CHF s/p MDT CRT-D, IDDM, polycythema vera, suspected CKD stage III (recent baseline 1.6-2), pleural effusion, dilated aortic root who presents for follow-up. He was recently admitted in 02/2016 with a/c CHF. Troponin peak 0.04. 2D Echo 03/16/16: EF 35-40%, diffuse HK, elevated LVEDP, dilated aortic root 69mm, mild MR, severe LAE, mod dilated RA, unchanged from 2016. He underwent cath on 03/18/16 showing diffusely diseased prox/mid RCA with distal RCA occlusion, hazy 70% pLAD and 100% mLAD with 55% mLCx stenosis, patent LIMA to LAD with 60% ostial LIMA stenosis, and patent SVG to OM, and SVG to PDA. LVEDP was 21. Plans were to optimize medical management with diuresis, and consider PCI to ostial LIMA if dyspnea did not improve. His celebrex and chlorthalidone were held at discharge given renal insufficiency and the addition of lasix 80mg  BID. F/u labs 03/30/16 showed pBNP 2275, BUN 35/Cr 1.77, Na 144, K 4.4, LFTs wnl. At his OV last week he was noting some swelling of his right arm - UE duplex neg for DVT. CT arm showed no focal hematoma; nonspecific subcutaneous edema throughout the forearm, potentially cellulitis.  Most recent f/u labs  Acute on chronic systolic CHF Right arm swelling CKD stage III CAD s/p CABG Dilated aortic root Permanent atrial fib    Past Medical History:  Diagnosis Date  . AAA (abdominal aortic aneurysm) ([place])    Surgical repair 11/2002.  . Adenomatous colon polyp   . AICD (automatic cardioverter/defibrillator) present   . Alcohol ingestion of more than four drinks per week    Excess beer   not a dependency problem  . Aortic valve sclerosis   . Arthritis   . Atrial fibrillation ([place])    Previous long-term amiodarone therapy with multiple cardioversions / amiodarone stopped September, 2009  . Atrial flutter [facility])    Started November, 2010, Left-sided and cannot ablate  . Bony abnormality    Patient's manubrium is slightly displaced to the right  . CAD (coronary artery disease)    a. s/p CABG 2004.  . Cardiac resynchronization therapy defibrillator (CRT-D) in place   . Carotid artery disease ([place])    Doppler, December, 2013, 0-39% bilateral  . Chronic systolic CHF (congestive heart failure) ([place])   . Chronotropic incompetence    IV pacing rate adjusted  . CKD (chronic kidney disease), stage III   . COPD (chronic obstructive pulmonary disease) ([place])   . Dilated aortic root ([place])   . Discolored skin   . Diverticulosis   . Drug therapy    Redness and swelling with Avelox infusion May 24, 2011  . Eye abnormality    Ophthalmologist questions a clot in one of his eyes, May, 2012  . Gout   . Hyperlipidemia   . Hypertension   . Internal hemorrhoids   . Ischemic cardiomyopathy   . Left atrial thrombus    Remote past... cardioversions done since that time  . Mitral regurgitation    Mild echo  . Myocardial infarction   . Nasal drainage    Chronic  . Overweight(278.02)   . Pericardial effusion   .  Pleural effusion    Large loculated effusion on the left side November, 2011. This was tapped. It was exudative. Cytology revealed no cancer no proof of mesothelioma area pulmonary team felt that no further workup was needed  . Pleural thickening   . Pneumonia   . Polycythemia vera ([place]) 07/28/2014  . SOB (shortness of breath)    Large left effusion/ thoracentesis/hospitalization/November, 2011... exudated.. cytology negative.. Dr.Wert.. no proof of mesothelioma  . Spinal stenosis    Surgery Dr.Elsner  . Thrombophlebitis of superficial veins of upper extremities    Possible  venous stenosis from defibrillator  . Type II diabetes mellitus ([place])   . Venous insufficiency    Toe discoloration chronic  . Ventral hernia    April, 2014, result of his abdominal surgery  . Warfarin anticoagulation   . Wide-complex tachycardia ([place])     Past Surgical History:  Procedure Laterality Date  . ABDOMINAL AORTIC ANEURYSM REPAIR  11/2002  . ANTERIOR CERVICAL DECOMP/DISCECTOMY FUSION  1995  . BACK SURGERY    . BI-VENTRICULAR PACEMAKER INSERTION (CRT-P)  02-11-2013   Pt with previously implanted MDT CRTD downgraded to CRTP by Dr [name] 02-11-13  . CARDIAC CATHETERIZATION  "several"  . CARDIAC CATHETERIZATION N/A 03/18/2016   Procedure: RIGHT/LEFT HEART CATH AND CORONARY/GRAFT ANGIOGRAPHY;  Surgeon: [name] Bush, MD;  Location: [facility];  Service: Cardiovascular;  Laterality: N/A;  . CATARACT EXTRACTION W/ INTRAOCULAR LENS  IMPLANT, BILATERAL    . COLONOSCOPY W/ POLYPECTOMY    . CORONARY ANGIOPLASTY WITH STENT PLACEMENT  2004  . CORONARY ARTERY BYPASS GRAFT  2004   CABG X4  . FRACTURE SURGERY    . IMPLANTABLE CARDIOVERTER DEFIBRILLATOR (ICD) GENERATOR CHANGE N/A 02/11/2013   Procedure: ICD GENERATOR CHANGE;  Surgeon: [name] Lance, MD;  Location: [facility] CATH LAB;  Service: Cardiovascular;  Laterality: N/A;  . INCISION AND DRAINAGE ABSCESS / HEMATOMA OF BURSA / KNEE / THIGH    . INSERT / REPLACE / REMOVE PACEMAKER  2009   original pacer/defibrillator; Dr. Lovena [name] 2009... by the pacing  . LUMBAR FUSION  2010  . ORIF TIBIA & FIBULA FRACTURES Right 2000s  . PILONIDAL CYST EXCISION    . SPINAL CORD STIMULATOR IMPLANT  12/2011  . SURGERY SCROTAL / TESTICULAR      Current Medications: Current Outpatient Prescriptions  Medication Sig Dispense Refill  . ACCU-CHEK AVIVA PLUS test strip USE ONE STRIP TO CHECK GLUCOSE THREE TIMES DAILY AS DIRECTED 100 each 3  . ACCU-CHEK FASTCLIX LANCETS MISC USE ONE  TO CHECK GLUCOSE THREE TIMES DAILY 102 each 5  . acetaminophen  (TYLENOL) 325 MG tablet Take 325 mg by mouth every 4 (four) hours as needed for moderate pain.     Marland Kitchen allopurinol (ZYLOPRIM) 100 MG tablet Take 2 tablets (200 mg total) by mouth daily. 180 tablet 1  . amLODipine (NORVASC) 5 MG tablet Take 2.5 mg by mouth daily.    Marland Kitchen aspirin 81 MG tablet Take 81 mg by mouth once a week. Take on sundays    . atorvastatin (LIPITOR) 10 MG tablet Take 1 tablet (10 mg total) by mouth daily. 90 tablet 3  . carboxymethylcellulose (REFRESH PLUS) 0.5 % SOLN Place 2 drops into both eyes daily.     . carvedilol (COREG) 6.25 MG tablet Take 1 tablet (6.25 mg total) by mouth 2 (two) times daily. 180 tablet 3  . Cholecalciferol 1000 units tablet Take 1,000 Units by mouth daily.    . colchicine 0.6 MG tablet  Take 0.6 mg by mouth daily as needed (gout).     . furosemide (LASIX) 80 MG tablet Take 1 tablet (80 mg total) by mouth 2 (two) times daily. 180 tablet 3  . gabapentin (NEURONTIN) 300 MG capsule TAKE ONE CAPSULE BY MOUTH 4 TIMES DAILY AS NEEDED FOR LEG PAIN (Patient taking differently: TAKE ONE CAPSULE BY MOUTH 5 TIMES DAILY AS NEEDED FOR LEG PAIN) 360 capsule 3  . insulin detemir (LEVEMIR) 100 UNIT/ML injection Inject 10 Units into the skin 2 (two) times daily.     . insulin regular (NOVOLIN R) 100 units/mL injection Inject 20 Units into the skin 2 (two) times daily before a meal.     . ipratropium (ATROVENT) 0.06 % nasal spray Place 1 spray into both nostrils daily.     Marland Kitchen losartan (COZAAR) 50 MG tablet TAKE ONE TABLET BY MOUTH ONCE DAILY 90 tablet 3  . methocarbamol (ROBAXIN) 750 MG tablet Take 750 mg by mouth 2 (two) times daily as needed for muscle spasms.    . nitroGLYCERIN (NITROSTAT) 0.4 MG SL tablet Place 1 tablet (0.4 mg total) under the tongue every 5 (five) minutes as needed for chest pain (MAX 3 TABLETS). 25 tablet 3  . omeprazole (PRILOSEC) 20 MG capsule Take 20 mg by mouth 2 (two) times daily before a meal.    . potassium chloride SA (K-DUR,KLOR-CON) 20 MEQ tablet  Take 30 mEq by mouth 2 (two) times daily.    . traMADol (ULTRAM) 50 MG tablet Take 1 tablet (50 mg total) by mouth every 8 (eight) hours as needed. 90 tablet 0  . warfarin (COUMADIN) 2.5 MG tablet TAKE ONE TABLET BY MOUTH AS DIRECTED (Patient taking differently: Takes 2.5 mg daily except 5mg  on Wedneday.) 120 tablet 1   No current facility-administered medications for this visit.      Allergies:   Avelox [moxifloxacin hcl in nacl] and Penicillins   Social History   Social History  . Marital status: Married    Spouse name: N/A  . Number of children: N/A  . Years of education: N/A   Occupational History  . Retired- Nurse, mental health    Social History Main Topics  . Smoking status: Former Smoker    Packs/day: 3.00    Years: 39.00    Types: Cigarettes    Start date: 05/24/1956    Quit date: 03/22/1995  . Smokeless tobacco: Never Used  . Alcohol use 1.2 oz/week    2 Cans of beer per week  . Drug use: No  . Sexual activity: No   Other Topics Concern  . Not on file   Social History Narrative  . No narrative on file     Family History:  The patient's family history includes Coronary artery disease in his father; Diabetes in his brother and father; Hypertension in his mother; Other in his father; Stroke in his mother. ***  ROS:   Please see the history of present illness. Otherwise, review of systems is positive for ***.  All other systems are reviewed and otherwise negative.    PHYSICAL EXAM:   VS:  There were no vitals taken for this visit.  BMI: There is no height or weight on file to calculate BMI. GEN: Well nourished, well developed, in no acute distress  HEENT: normocephalic, atraumatic Neck: no JVD, carotid bruits, or masses Cardiac: ***RRR; no murmurs, rubs, or gallops, no edema  Respiratory:  clear to auscultation bilaterally, normal work of breathing GI: soft, nontender, nondistended, + BS  MS: no deformity or atrophy  Skin: warm and dry, no rash Neuro:   Alert and Oriented x 3, Strength and sensation are intact, follows commands Psych: euthymic mood, full affect  Wt Readings from Last 3 Encounters:  03/30/16 247 lb 12.8 oz (112.4 kg)  03/23/16 241 lb 8 oz (109.5 kg)  03/09/16 257 lb (116.6 kg)      Studies/Labs Reviewed:   EKG:  EKG was ordered today and personally reviewed by me and demonstrates *** EKG was not ordered today.***  Recent Labs: 03/15/2016: B Natriuretic Peptide 610.8; Magnesium 2.1 03/23/2016: Hemoglobin 12.7 03/30/2016: ALT 11; BUN 35; Creatinine, Ser 1.77; NT-Pro BNP 2,275; Platelets 211; Potassium 4.4; Sodium 144   Lipid Panel    Component Value Date/Time   CHOL 124 03/16/2016 0102   TRIG 95 03/16/2016 0102   HDL 41 03/16/2016 0102   CHOLHDL 3.0 03/16/2016 0102   VLDL 19 03/16/2016 0102   LDLCALC 64 03/16/2016 0102    Additional studies/ records that were reviewed today include: Summarized above.***    ASSESSMENT & PLAN:   1. ***  Disposition: F/u with ***   Medication Adjustments/Labs and Tests Ordered: Current medicines are reviewed at length with the patient today.  Concerns regarding medicines are outlined above. Medication changes, Labs and Tests ordered today are summarized above and listed in the Patient Instructions accessible in Encounters.   Raechel Ache PA-C  04/06/2016 12:58 PM    [facility] [address], [place], [place]  [address] Phone: [phone]; Fax: [phone]

## 2016-04-07 ENCOUNTER — Ambulatory Visit: Payer: Medicare Other | Admitting: Physician Assistant

## 2016-04-11 ENCOUNTER — Ambulatory Visit (INDEPENDENT_AMBULATORY_CARE_PROVIDER_SITE_OTHER): Payer: Medicare Other | Admitting: Pharmacist

## 2016-04-11 ENCOUNTER — Encounter: Payer: Self-pay | Admitting: Nurse Practitioner

## 2016-04-11 ENCOUNTER — Other Ambulatory Visit: Payer: Self-pay

## 2016-04-11 ENCOUNTER — Other Ambulatory Visit (INDEPENDENT_AMBULATORY_CARE_PROVIDER_SITE_OTHER): Payer: Medicare Other

## 2016-04-11 ENCOUNTER — Ambulatory Visit (INDEPENDENT_AMBULATORY_CARE_PROVIDER_SITE_OTHER): Payer: Medicare Other | Admitting: Nurse Practitioner

## 2016-04-11 VITALS — BP 138/80 | HR 71 | Ht 72.0 in | Wt 253.6 lb

## 2016-04-11 DIAGNOSIS — Z95810 Presence of automatic (implantable) cardiac defibrillator: Secondary | ICD-10-CM | POA: Diagnosis not present

## 2016-04-11 DIAGNOSIS — Z7901 Long term (current) use of anticoagulants: Secondary | ICD-10-CM

## 2016-04-11 DIAGNOSIS — I4891 Unspecified atrial fibrillation: Secondary | ICD-10-CM | POA: Diagnosis not present

## 2016-04-11 DIAGNOSIS — E1165 Type 2 diabetes mellitus with hyperglycemia: Secondary | ICD-10-CM

## 2016-04-11 DIAGNOSIS — I482 Chronic atrial fibrillation, unspecified: Secondary | ICD-10-CM

## 2016-04-11 DIAGNOSIS — I255 Ischemic cardiomyopathy: Secondary | ICD-10-CM

## 2016-04-11 DIAGNOSIS — Z794 Long term (current) use of insulin: Secondary | ICD-10-CM

## 2016-04-11 DIAGNOSIS — I5022 Chronic systolic (congestive) heart failure: Secondary | ICD-10-CM | POA: Diagnosis not present

## 2016-04-11 LAB — POCT INR: INR: 2.2

## 2016-04-11 LAB — HEMOGLOBIN A1C: Hgb A1c MFr Bld: 6 % (ref 4.6–6.5)

## 2016-04-11 LAB — GLUCOSE, RANDOM: GLUCOSE: 128 mg/dL — AB (ref 70–99)

## 2016-04-11 NOTE — Patient Instructions (Addendum)
We will be checking the following labs today - BMET and pro BNP  INR today  Medication Instructions:    Continue with your current medicines.     Testing/Procedures To Be Arranged:  N/A  Follow-Up:   See Dr. Irish Lack as planned in March    Other Special Instructions:   Ok to have your back injection on February 5th - ok to hold coumadin 3 days prior  Lets hold on colonoscopy until after next visit with Dr. Irish Lack.   Ok to try and get a compression sleeve for your right arm - this would be at the [employer] store.    If you need a refill on your cardiac medications before your next appointment, please call your pharmacy.   Call the [place] office at [phone] if you have any questions, problems or concerns.

## 2016-04-11 NOTE — Progress Notes (Signed)
CARDIOLOGY OFFICE NOTE  Date:  04/11/2016    [name] Date of Birth: [birth year] Medical Record [id number]  PCP:  [name], MD  Cardiologist:  Starr Lake   Chief Complaint  Patient presents with  . Coronary Artery Disease    2 week check - seen for Dr. Irish Lack    History of Present Illness: [name] is a 76 y.o. male who presents today for a follow up visit. Seen for Dr. Irish Lack.   He has a PMH CAD s/p CABG, AF, chronic systolic HF, IDDM, polycythemia vera . He underwent v4 CABG in 2004, and then AAA repair later that year. EF was decreased at 25% at the time of cath, but has since recovered to 35-40%. He is also followed by Dr. Lovena Le for his PPM/ICD. He was followed by Dr. [name], and then has transitioned to Dr. Irish Lack.   He had been in his usual state of health until the past couple of weeks. States he has had progressive dsypnea that he first noticed with exertion but has now progressed to symptoms with rest. Denies any worsening lower extremity edema or weight gain over the past couple of weeks. Does not report significant chest pain or discomfort, but states he did not have this prior to his CABG in 2004. Also reports some orthopnea, but feels these episodes are coming more often. Called this office stating he was very short of breath and was advised to come to the ED.   In the ED his EKG showed AF, rate controlled. Labs showed stable electrolytes, BNP 610, Hgb 13.8, INR 2.57. CXR without significant edema.   He was admitted and started on IV lasix, with his coumadin held in preparation for possible cardiac cath. PE was considered, but unlikely given his chronic coumadin use and therapeutic INR. Hgb remained stable. Initial dose of IV lasix did not improve his dyspnea symptoms. Repeat echo this admission showed EF of 35-45% with diffuse HK. He underwent cath on 03/18/16 showing diffusely diseased prox/mid RCA with distal RCA occlusion, hazy  70% pLAD and 100% mLAD with 55% mLCx stenosis. Also patent LIMA to LAD with 60% ostial LIMA stenosis, and patent SVG to OM, and SVG to PDA. LVEDP was 21. Plans were to optimize medical management with diuresis, and secondary prevention. Could consider PCI to ostial LIMA if dyspnea does not improve. He was resumed on his coumadin post cath. His weight did trend downward.  Weight was down 11lbs, and he was switched to oral diuretic on 03/23/16. INR did improve slowly. Of note he did develop some bruising under the skin on his right arm where IV was placed, but the chart notes this did improve during this admission.   On 03/23/16 he was seen and assessed by Dr. Tamala Julian and determined stable for discharge home. His weight was 241lbs. He was sent home on 80mg  PO lasix BID. He resumed his outpatient dose of coumadin.  His celebrex and chlorthalidone were held at discharge given his CR, and due to the addition of lasix.   I saw him almost 2 weeks ago - was feeling better and breathing had improved. Biggest issue was his right arm - lots of swelling/firmness/red - negative CT and doppler study. Coumadin had just gotten therapeutic when I saw him. Wanted to go back on Celebrex which I advised against. Also wanted to stop his coumadin for a back injection - I advised against this as well.   He was  to see Dr. Irish Lack last week - but visit cancelled due to the snow.   Comes in today. Here with his wife. He continues to improve from the standpoint of his arm. Less swelling. Still tender but it is improving. No chest pain. He says his breathing is stable. Has had to use some extra Lasix a few times since last here with good response. No chest pain. He has his back injection in early February. Also got a notice about needing colonoscopy (last one a year ago with large polyp found). Now on some Ultram - seems to be helping his pain. Overall, they are both happy with how he is doing.   Past Medical History:  Diagnosis Date    . AAA (abdominal aortic aneurysm) ([place])    Surgical repair 11/2002.  . Adenomatous colon polyp   . AICD (automatic cardioverter/defibrillator) present   . Alcohol ingestion of more than four drinks per week    Excess beer  not a dependency problem  . Aortic valve sclerosis   . Arthritis   . Atrial fibrillation ([place])    Previous long-term amiodarone therapy with multiple cardioversions / amiodarone stopped September, 2009  . Atrial flutter [facility])    Started November, 2010, Left-sided and cannot ablate  . Bony abnormality    Patient's manubrium is slightly displaced to the right  . CAD (coronary artery disease)    a. s/p CABG 2004.  . Cardiac resynchronization therapy defibrillator (CRT-D) in place   . Carotid artery disease ([place])    Doppler, December, 2013, 0-39% bilateral  . Chronic systolic CHF (congestive heart failure) ([place])   . Chronotropic incompetence    IV pacing rate adjusted  . CKD (chronic kidney disease), stage III   . COPD (chronic obstructive pulmonary disease) ([place])   . Dilated aortic root ([place])   . Discolored skin   . Diverticulosis   . Drug therapy    Redness and swelling with Avelox infusion May 24, 2011  . Eye abnormality    Ophthalmologist questions a clot in one of his eyes, May, 2012  . Gout   . Hyperlipidemia   . Hypertension   . Internal hemorrhoids   . Ischemic cardiomyopathy   . Left atrial thrombus    Remote past... cardioversions done since that time  . Mitral regurgitation    Mild echo  . Myocardial infarction   . Nasal drainage    Chronic  . Overweight(278.02)   . Pericardial effusion   . Pleural effusion    Large loculated effusion on the left side November, 2011. This was tapped. It was exudative. Cytology revealed no cancer no proof of mesothelioma area pulmonary team felt that no further workup was needed  . Pleural thickening   . Pneumonia   . Polycythemia vera ([place]) 07/28/2014  . SOB (shortness of breath)    Large left effusion/  thoracentesis/hospitalization/November, 2011... exudated.. cytology negative.. Dr.Wert.. no proof of mesothelioma  . Spinal stenosis    Surgery Dr.Elsner  . Thrombophlebitis of superficial veins of upper extremities    Possible venous stenosis from defibrillator  . Type II diabetes mellitus ([place])   . Venous insufficiency    Toe discoloration chronic  . Ventral hernia    April, 2014, result of his abdominal surgery  . Warfarin anticoagulation   . Wide-complex tachycardia ([place])     Past Surgical History:  Procedure Laterality Date  . ABDOMINAL AORTIC ANEURYSM REPAIR  11/2002  . ANTERIOR CERVICAL DECOMP/DISCECTOMY FUSION  1995  .  BACK SURGERY    . BI-VENTRICULAR PACEMAKER INSERTION (CRT-P)  02-11-2013   Pt with previously implanted MDT CRTD downgraded to CRTP by Dr Lovena Le 02-11-13  . CARDIAC CATHETERIZATION  "several"  . CARDIAC CATHETERIZATION N/A 03/18/2016   Procedure: RIGHT/LEFT HEART CATH AND CORONARY/GRAFT ANGIOGRAPHY;  Surgeon: Nelva Bush, MD;  Location: [facility];  Service: Cardiovascular;  Laterality: N/A;  . CATARACT EXTRACTION W/ INTRAOCULAR LENS  IMPLANT, BILATERAL    . COLONOSCOPY W/ POLYPECTOMY    . CORONARY ANGIOPLASTY WITH STENT PLACEMENT  2004  . CORONARY ARTERY BYPASS GRAFT  2004   CABG X4  . FRACTURE SURGERY    . IMPLANTABLE CARDIOVERTER DEFIBRILLATOR (ICD) GENERATOR CHANGE N/A 02/11/2013   Procedure: ICD GENERATOR CHANGE;  Surgeon: Evans Lance, MD;  Location: [facility] CATH LAB;  Service: Cardiovascular;  Laterality: N/A;  . INCISION AND DRAINAGE ABSCESS / HEMATOMA OF BURSA / KNEE / THIGH    . INSERT / REPLACE / REMOVE PACEMAKER  2009   original pacer/defibrillator; Dr. Lovena Le 2009... by the pacing  . LUMBAR FUSION  2010  . ORIF TIBIA & FIBULA FRACTURES Right 2000s  . PILONIDAL CYST EXCISION    . SPINAL CORD STIMULATOR IMPLANT  12/2011  . SURGERY SCROTAL / TESTICULAR       Medications: Current Outpatient Prescriptions  Medication Sig Dispense  Refill  . ACCU-CHEK AVIVA PLUS test strip USE ONE STRIP TO CHECK GLUCOSE THREE TIMES DAILY AS DIRECTED 100 each 3  . ACCU-CHEK FASTCLIX LANCETS MISC USE ONE  TO CHECK GLUCOSE THREE TIMES DAILY 102 each 5  . acetaminophen (TYLENOL) 325 MG tablet Take 325 mg by mouth every 4 (four) hours as needed for moderate pain.     Marland Kitchen allopurinol (ZYLOPRIM) 100 MG tablet Take 2 tablets (200 mg total) by mouth daily. 180 tablet 1  . amLODipine (NORVASC) 5 MG tablet Take 2.5 mg by mouth daily.    Marland Kitchen aspirin 81 MG tablet Take 81 mg by mouth once a week. Take on sundays    . atorvastatin (LIPITOR) 10 MG tablet Take 1 tablet (10 mg total) by mouth daily. 90 tablet 3  . carboxymethylcellulose (REFRESH PLUS) 0.5 % SOLN Place 2 drops into both eyes daily.     . carvedilol (COREG) 6.25 MG tablet Take 1 tablet (6.25 mg total) by mouth 2 (two) times daily. 180 tablet 3  . Cholecalciferol 1000 units tablet Take 1,000 Units by mouth daily.    . colchicine 0.6 MG tablet Take 0.6 mg by mouth daily as needed (gout).     . furosemide (LASIX) 80 MG tablet Take 1 tablet (80 mg total) by mouth 2 (two) times daily. 180 tablet 3  . gabapentin (NEURONTIN) 300 MG capsule TAKE ONE CAPSULE BY MOUTH 4 TIMES DAILY AS NEEDED FOR LEG PAIN (Patient taking differently: TAKE ONE CAPSULE BY MOUTH 5 TIMES DAILY AS NEEDED FOR LEG PAIN) 360 capsule 3  . insulin detemir (LEVEMIR) 100 UNIT/ML injection Inject 10 Units into the skin 2 (two) times daily.     . insulin regular (NOVOLIN R) 100 units/mL injection Inject 20 Units into the skin 2 (two) times daily before a meal.     . ipratropium (ATROVENT) 0.06 % nasal spray Place 1 spray into both nostrils daily.     Marland Kitchen losartan (COZAAR) 50 MG tablet TAKE ONE TABLET BY MOUTH ONCE DAILY 90 tablet 3  . methocarbamol (ROBAXIN) 750 MG tablet Take 750 mg by mouth 2 (two) times daily as needed for  muscle spasms.    . nitroGLYCERIN (NITROSTAT) 0.4 MG SL tablet Place 1 tablet (0.4 mg total) under the tongue every 5  (five) minutes as needed for chest pain (MAX 3 TABLETS). 25 tablet 3  . omeprazole (PRILOSEC) 20 MG capsule Take 20 mg by mouth 2 (two) times daily before a meal.    . potassium chloride SA (K-DUR,KLOR-CON) 20 MEQ tablet Take 30 mEq by mouth 2 (two) times daily.    . traMADol (ULTRAM) 50 MG tablet Take 1 tablet (50 mg total) by mouth every 8 (eight) hours as needed. 90 tablet 0  . warfarin (COUMADIN) 2.5 MG tablet TAKE ONE TABLET BY MOUTH AS DIRECTED (Patient taking differently: Takes 2.5 mg daily except 5mg  on Wedneday.) 120 tablet 1   No current facility-administered medications for this visit.     Allergies: Allergies  Allergen Reactions  . Avelox [Moxifloxacin Hcl In Nacl] Swelling, Rash and Other (See Comments)    Patient became hypotensive after infusion started Because of a history of documented adverse serious drug reaction;Medi Alert bracelet  is recommended  . Penicillins Anaphylaxis, Other (See Comments) and Swelling    REACTION: anaphylaxis Because of a history of documented adverse serious drug reaction;Medi Alert bracelet  is recommended    Social History: The patient  reports that he quit smoking about 21 years ago. His smoking use included Cigarettes. He started smoking about 59 years ago. He has a 117.00 pack-year smoking history. He has never used smokeless tobacco. He reports that he drinks about 1.2 oz of alcohol per week . He reports that he does not use drugs.   Family History: The patient's family history includes Coronary artery disease in his father; Diabetes in his brother and father; Hypertension in his mother; Other in his father; Stroke in his mother.   Review of Systems: Please see the history of present illness.   Otherwise, the review of systems is positive for none.   All other systems are reviewed and negative.   Physical Exam: VS:  BP 138/80   Pulse 71   Ht 6' (1.829 m)   Wt 253 lb 9.6 oz (115 kg)   SpO2 98% Comment: at rest  BMI 34.39 kg/m  .   BMI Body mass index is 34.39 kg/m.  Wt Readings from Last 3 Encounters:  04/11/16 253 lb 9.6 oz (115 kg)  03/30/16 247 lb 12.8 oz (112.4 kg)  03/23/16 241 lb 8 oz (109.5 kg)    General: Pleasant. Obese male who is alert and in no acute distress. Weight is up 6 pounds since last visit with me.   HEENT: Normal.  Neck: Supple, no JVD, carotid bruits, or masses noted.  Cardiac: Irregular irregular rhythm. Rate is ok.  No murmurs, rubs, or gallops. No edema. The right arm has less swelling and is no longer red.  Respiratory:  Lungs are clear to auscultation bilaterally with normal work of breathing.  GI: Soft and nontender.  MS: No deformity or atrophy. Gait and ROM intact.  Skin: Warm and dry. Color is normal.  Neuro:  Strength and sensation are intact and no gross focal deficits noted.  Psych: Alert, appropriate and with normal affect.   LABORATORY DATA:  EKG:  EKG is not ordered today.  Lab Results  Component Value Date   WBC 7.2 03/30/2016   HGB 12.7 (L) 03/23/2016   HCT 41.6 03/30/2016   PLT 211 03/30/2016   GLUCOSE 134 (H) 03/30/2016   CHOL 124 03/16/2016  TRIG 95 03/16/2016   HDL 41 03/16/2016   LDLCALC 64 03/16/2016   ALT 11 03/30/2016   AST 12 03/30/2016   NA 144 03/30/2016   K 4.4 03/30/2016   CL 102 03/30/2016   CREATININE 1.77 (H) 03/30/2016   BUN 35 (H) 03/30/2016   CO2 21 03/30/2016   TSH 0.59 10/07/2014   PSA 0.33 01/07/2010   INR 2.0 03/30/2016   HGBA1C 6.3 (H) 03/15/2016   MICROALBUR 1.2 10/12/2015   Lab Results  Component Value Date   INR 2.0 03/30/2016   INR 1.51 03/23/2016   INR 1.29 03/22/2016   PROTIME 16.7 09/08/2008   PROTIME 18.5 08/25/2008     BNP (last 3 results)  Recent Labs  03/15/16 1314  BNP 610.8*    ProBNP (last 3 results)  Recent Labs  03/30/16 1243  PROBNP 2,275*     Other Studies Reviewed Today:  TTE: 03/16/16  Study Conclusions  - Left ventricle: The cavity size was mildly dilated. There  was moderate concentric hypertrophy. Systolic function was moderately reduced. The estimated ejection fraction was in the range of 35% to 40%. Diffuse hypokinesis. Doppler parameters are consistent with restrictive physiology, indicative of decreased left ventricular diastolic compliance and/or increased left atrial pressure. Doppler parameters are consistent with elevated ventricular end-diastolic filling pressure. - Ventricular septum: Septal motion showed paradox. - Aortic valve: Trileaflet; normal thickness leaflets. Transvalvular velocity was within the normal range. There was no stenosis. There was trivial regurgitation. - Aortic root: The aortic root was mildly dilated measuring 41 mm. - Ascending aorta: The ascending aorta was normal in size. - Mitral valve: There was mild regurgitation. - Left atrium: The atrium was severely dilated. - Right ventricle: Pacer wire or catheter noted in right ventricle. - Right atrium: The atrium was moderately dilated. Pacer wire or catheter noted in right atrium. - Tricuspid valve: There was trivial regurgitation. - Pulmonary arteries: Systolic pressure was within the normal range. - Inferior vena cava: The vessel was normal in size. - Pericardium, extracardiac: There was no pericardial effusion.  Impressions:  - There is no significant difference since the prior study on 05/08/14. LVEF remains moderately decreased with diffuse hypokinesis and paradoxical septal motion.  R/LHC: 03/18/16  Conclusions: 1. Severe native coronary artery disease, including diffuse proximal LAD stenosis up to 70% and mid LAD occlusion, 70% mid LCx disease and occlusion of OM2 and OM3, and diffusely diseased mid RCA with chronic total occlusion of the distal RCA. 2. Patent LIMA to LAD supplying small to medium-caliber distal LAD with 60% ostial LIMA stenosis. 3. Patent SVG to distal OM. Haziness in the proximal segment of the vein  graft most likely represents a valve in the similar in appearance to the catheterization from 2008. 4. Patent SVG to PDA with two valves that appear similar to 2008. 5. Mildly elevated left and right heart filling pressures. 6. Mild pulmonary hypertension. 7. Reduced Fick cardiac output/index.  Recommendations: 1. Recommend avoidance of further IV hydration today and begin gentle diuresis tomorrow based on renal function. 2. If patient continues to have dyspnea despite diuresis and optimization of heart failure regimen, he may benefit from FFR/intervention to ostial LIMA. 3. With the exception of LIMA engagement/intervention, would recommend avoidance of left radial approach due to subclavian artery tortuosity and calcification for future cases. 4. Aggressive secondary prevention.   Assessment/Plan: 1. Chronic systolic HF - I wondered at last visit if the Celebrex was the trigger - he is  NOT to restart this.  Weight is up 6 pounds. He is using extra Lasix prn with good results. Lab today. I have left him on his current regimen. He says he is at his baseline.   2. CAD - no active chest pain - breathing is at his baseline - I have left him on his current regimen for now. Would continue to hold on FFR/PCI. Discussed with Dr. Irish Lack who is in agreement as well.  3. Right arm swelling - slowly improving. Have advised a compression sleeve if needed.   4. CKD - lab today. Would avoid Celebrex  5. Chronic pain - ok to have his injection in early February. Ok to hold the coumadin for 3 days prior. Discussed with Dr. Irish Lack.   6. AF - managed with rate control and anticoagulation - INR today.   Current medicines are reviewed with the patient today.  The patient does not have concerns regarding medicines other than what has been noted above.  The following changes have been made:  See above.  Labs/ tests ordered today include:    Orders Placed This Encounter  Procedures  . Basic  metabolic panel  . Pro b natriuretic peptide (BNP)     Disposition:   FU with Dr. Irish Lack as planned in March.  Patient is agreeable to this plan and will call if any problems develop in the interim.   Signed: Burtis Junes, RN, ANP-C 04/11/2016 10:14 AM  [facility] [address] [place], [place]  [address] Phone: [phone] Fax: [phone]

## 2016-04-12 LAB — BASIC METABOLIC PANEL
BUN/Creatinine Ratio: 20 (ref 10–24)
BUN: 26 mg/dL (ref 8–27)
CO2: 20 mmol/L (ref 18–29)
Calcium: 8.9 mg/dL (ref 8.6–10.2)
Chloride: 103 mmol/L (ref 96–106)
Creatinine, Ser: 1.32 mg/dL — ABNORMAL HIGH (ref 0.76–1.27)
GFR calc Af Amer: 61 mL/min/{1.73_m2} (ref >59)
GFR calc non Af Amer: 53 mL/min/{1.73_m2} — ABNORMAL LOW (ref >59)
Glucose: 137 mg/dL — ABNORMAL HIGH (ref 65–99)
Potassium: 4 mmol/L (ref 3.5–5.2)
Sodium: 140 mmol/L (ref 134–144)

## 2016-04-12 LAB — PRO B NATRIURETIC PEPTIDE: NT-Pro BNP: 2369 pg/mL — ABNORMAL HIGH (ref 0–376)

## 2016-04-13 ENCOUNTER — Ambulatory Visit: Payer: Self-pay

## 2016-04-13 ENCOUNTER — Ambulatory Visit (INDEPENDENT_AMBULATORY_CARE_PROVIDER_SITE_OTHER): Payer: Medicare Other | Admitting: Internal Medicine

## 2016-04-13 ENCOUNTER — Ambulatory Visit (INDEPENDENT_AMBULATORY_CARE_PROVIDER_SITE_OTHER): Payer: Medicare Other | Admitting: Family Medicine

## 2016-04-13 ENCOUNTER — Encounter: Payer: Self-pay | Admitting: Family Medicine

## 2016-04-13 ENCOUNTER — Other Ambulatory Visit: Payer: Self-pay | Admitting: Internal Medicine

## 2016-04-13 ENCOUNTER — Encounter: Payer: Self-pay | Admitting: Internal Medicine

## 2016-04-13 ENCOUNTER — Other Ambulatory Visit: Payer: Medicare Other

## 2016-04-13 VITALS — BP 122/68 | HR 81 | Ht 72.0 in | Wt 253.0 lb

## 2016-04-13 VITALS — BP 122/68 | HR 81 | Temp 97.6°F | Resp 16 | Wt 253.0 lb

## 2016-04-13 DIAGNOSIS — E1165 Type 2 diabetes mellitus with hyperglycemia: Secondary | ICD-10-CM

## 2016-04-13 DIAGNOSIS — M25522 Pain in left elbow: Secondary | ICD-10-CM

## 2016-04-13 DIAGNOSIS — E1159 Type 2 diabetes mellitus with other circulatory complications: Secondary | ICD-10-CM

## 2016-04-13 DIAGNOSIS — I10 Essential (primary) hypertension: Secondary | ICD-10-CM

## 2016-04-13 DIAGNOSIS — N183 Chronic kidney disease, stage 3 unspecified: Secondary | ICD-10-CM

## 2016-04-13 DIAGNOSIS — M25422 Effusion, left elbow: Secondary | ICD-10-CM | POA: Diagnosis not present

## 2016-04-13 DIAGNOSIS — E1151 Type 2 diabetes mellitus with diabetic peripheral angiopathy without gangrene: Secondary | ICD-10-CM | POA: Diagnosis not present

## 2016-04-13 DIAGNOSIS — M545 Low back pain: Secondary | ICD-10-CM

## 2016-04-13 DIAGNOSIS — Z8739 Personal history of other diseases of the musculoskeletal system and connective tissue: Secondary | ICD-10-CM

## 2016-04-13 DIAGNOSIS — Z794 Long term (current) use of insulin: Secondary | ICD-10-CM

## 2016-04-13 DIAGNOSIS — G8929 Other chronic pain: Secondary | ICD-10-CM

## 2016-04-13 DIAGNOSIS — I509 Heart failure, unspecified: Secondary | ICD-10-CM

## 2016-04-13 DIAGNOSIS — IMO0002 Reserved for concepts with insufficient information to code with codable children: Secondary | ICD-10-CM

## 2016-04-13 LAB — URIC ACID, SYNOVIAL FLUID: Uric Acid, Synovial Fluid: 15 mg/dL — ABNORMAL HIGH (ref 0.0–8.0)

## 2016-04-13 MED ORDER — DOXYCYCLINE HYCLATE 100 MG PO TABS: 100.0000 mg | ORAL_TABLET | Freq: Two times a day (BID) | ORAL | 0 refills | Status: AC

## 2016-04-13 MED ORDER — TRAMADOL HCL 50 MG PO TABS: 100.0000 mg | ORAL_TABLET | Freq: Three times a day (TID) | ORAL | 2 refills | Status: DC | PRN

## 2016-04-13 NOTE — Assessment & Plan Note (Signed)
Sees Dr Dwyane Dee tomorrow - will let him adjust medication

## 2016-04-13 NOTE — Assessment & Plan Note (Signed)
Improved with last blood work celebrex stopped Using tramadol for pain Monitored closely with lasix dosing

## 2016-04-13 NOTE — Assessment & Plan Note (Signed)
Monitor blood sugar closer over the course the next 3 days.

## 2016-04-13 NOTE — Assessment & Plan Note (Signed)
Patient did have aspiration done today. I am concerned though that patient may have more of a possible cellulitis will be started on doxycycline. We were able to aspirate some fluid and this will be sent down to the lab for further evaluation. Likely patient is also having some potential gout flare with patient being on multiple diuretics while he was having a congestive heart failure flare. Discussed with patient to watch for any bruising or bleeding. We discussed if any worsening redness to seek medical attention immediately. Patient will follow-up again in 2 weeks.

## 2016-04-13 NOTE — Progress Notes (Signed)
Subjective:    Patient ID: [name], male    DOB: January 13, 1941, 76 y.o.   MRN: [id number]  HPI The patient is here for follow up.  Recent hospitalizations: He was in the hospital the end of December for shortness of breath. Initially was experiencing shortness of breath with exertion, but worsened the point that he was experiencing at rest. He was not experiencing increased edema or chest pain. He was on IV Lasix and there was no improvement in his shortness of breath. An echocardiogram showed an EF of 35-45% and diffuse hypokinesis. He had a cardiac catheterization 03/18/16. No intervention was done and the goal was to optimize his medical management. Cardiology stated that if his symptoms did not improve PCI to the ostial LIMA could be considered. He did have bruising in his right arm on an IV was placed and improved during this hospitalization. His weight was down 11 pounds at discharge. He was advised to hold the Celebrex and chlorthalidone due to increased creatinine. His Lasix was changed to 80 mg twice daily. His potassium was also increased.  He weighs himself every day and was instructed to take an extra lasix pill if he goes up two lbs.  His weight has been stable.  His BNP was recently elevated and cardiology did instruct him to take extra lasix.   Diabetes: He sees Dr Dwyane Dee tomorrow.  He is taking his medication daily as prescribed. He is compliant with a diabetic diet. He is currently not exercising regularly due to chronic pain. He does monitor his sugars at home and they have been on the low side.  He has had to hold his medication on occasion.   CAD, chronic systolic heart failure, atrial fibrillation, hypertension: He is taking his medication daily as prescribed. He denies chest pain, palpitations and lightheadedness and regular headaches. His edema is well controlled.   Chronic kidney disease:  He was taken off the celebrex which did work well for his back pain.  He  started taking tramadol instead.   Elbow pain, left:   He woke up with the pain.  The elbow is swollen, painful and a little warm.  He denies fever.  He has taken his allopurinol daily.  He has taken 1 colchicine twice daily for 5 doses and there has been no improvement.  He has a history of gout.  He denies feeling sick.   Chronic back pain:  He is taking tramadol 50 mg twice daily, but only helps minimally.  He has injections the fifth of next month.  The celebrex worked well, but he understands he can not take it.   Medications and allergies reviewed with patient and updated if appropriate.  Patient Active Problem List   Diagnosis Date Noted  . Angina pectoris ([place]) 03/15/2016  . History of gout 10/09/2015  . Polycythemia vera ([place]) 07/28/2014  . Ascending aortic aneurysm ([place]) 05/10/2014  . Hypoxia 05/05/2014  . COPD exacerbation ([place]) 05/05/2014  . Diabetes ([place]) 05/05/2014  . Chronic systolic CHF (congestive heart failure) ([place]) 03/07/2014  . History of DVT (deep vein thrombosis) 03/07/2014  . Cardiomyopathy, ischemic 02/05/2014  . Pleural plaque without asbestosis 01/23/2014  . Elevated hemoglobin ([place]) 01/14/2014  . CKD (chronic kidney disease) stage 3, GFR 30-59 ml/min 01/14/2014  . Asbestos exposure 01/14/2014  . Shortness of breath 01/01/2014  . Ejection fraction   . Hypopotassemia 12/20/2012  . ACE-inhibitor cough 10/04/2012  . Ventral hernia   . Bony abnormality   .  Carotid artery disease ([place])   . Spinal cord stimulator status   . Peripheral vascular disease with claudication ([place]) 08/31/2011  . Atrial fibrillation ([place])   . Spinal stenosis   . CAD (coronary artery disease)   . Venous insufficiency   . Mitral regurgitation   . Aortic valve sclerosis   . Alcohol ingestion of more than four drinks per week   . Chronotropic incompetence   . Thrombophlebitis of superficial veins of upper extremities   . Pericardial effusion   . Overweight(278.02)   . Pleural  effusion   . Hyperlipidemia   . Hypertension   . Atrial flutter ([place])   . Left atrial thrombus   . AAA (abdominal aortic aneurysm) ([place])   . S/P ICD (internal cardiac defibrillator) procedure   . SOB (shortness of breath)   . Warfarin anticoagulation   . Diabetic polyneuropathy ([place]) 01/07/2010  . DIVERTICULOSIS, COLON 06/10/2009  . Uncontrolled diabetes mellitus with peripheral circulatory disorder ([place]) 03/11/2009  . BACK PAIN, LUMBAR 06/12/2008  . 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 Acute gout 07/27/2006  . CORONARY ARTERY BYPASS GRAFT, HX OF 07/27/2006    Current Outpatient Prescriptions on File Prior to Visit  Medication Sig Dispense Refill  . ACCU-CHEK AVIVA PLUS test strip USE ONE STRIP TO CHECK GLUCOSE THREE TIMES DAILY AS DIRECTED 100 each 3  . ACCU-CHEK FASTCLIX LANCETS MISC USE ONE  TO CHECK GLUCOSE THREE TIMES DAILY 102 each 5  . acetaminophen (TYLENOL) 325 MG tablet Take 325 mg by mouth every 4 (four) hours as needed for moderate pain.     Marland Kitchen allopurinol (ZYLOPRIM) 100 MG tablet Take 2 tablets (200 mg total) by mouth daily. 180 tablet 1  . amLODipine (NORVASC) 5 MG tablet Take 2.5 mg by mouth daily.    Marland Kitchen aspirin 81 MG tablet Take 81 mg by mouth once a week. Take on sundays    . atorvastatin (LIPITOR) 10 MG tablet Take 1 tablet (10 mg total) by mouth daily. 90 tablet 3  . carboxymethylcellulose (REFRESH PLUS) 0.5 % SOLN Place 2 drops into both eyes daily.     . carvedilol (COREG) 6.25 MG tablet Take 1 tablet (6.25 mg total) by mouth 2 (two) times daily. 180 tablet 3  . Cholecalciferol 1000 units tablet Take 1,000 Units by mouth daily.    . colchicine 0.6 MG tablet Take 0.6 mg by mouth daily as needed (gout).     . furosemide (LASIX) 80 MG tablet Take 80 mg twice daily. May take additional 40 mg daily prn for weight gain of 2 lbs or more in 24 hour period. 180 tablet 3  . gabapentin (NEURONTIN) 300 MG capsule TAKE ONE CAPSULE BY MOUTH 4 TIMES DAILY AS  NEEDED FOR LEG PAIN (Patient taking differently: TAKE ONE CAPSULE BY MOUTH 5 TIMES DAILY AS NEEDED FOR LEG PAIN) 360 capsule 3  . insulin detemir (LEVEMIR) 100 UNIT/ML injection Inject 10 Units into the skin 2 (two) times daily.     . insulin regular (NOVOLIN R) 100 units/mL injection Inject 20 Units into the skin 2 (two) times daily before a meal.     . ipratropium (ATROVENT) 0.06 % nasal spray Place 1 spray into both nostrils daily.     Marland Kitchen losartan (COZAAR) 50 MG tablet TAKE ONE TABLET BY MOUTH ONCE DAILY 90 tablet 3  . methocarbamol (ROBAXIN) 750 MG tablet Take 750 mg by mouth 2 (two) times daily as needed for muscle spasms.    Marland Kitchen  nitroGLYCERIN (NITROSTAT) 0.4 MG SL tablet Place 1 tablet (0.4 mg total) under the tongue every 5 (five) minutes as needed for chest pain (MAX 3 TABLETS). 25 tablet 3  . omeprazole (PRILOSEC) 20 MG capsule Take 20 mg by mouth 2 (two) times daily before a meal.    . potassium chloride SA (K-DUR,KLOR-CON) 20 MEQ tablet Take 30 mEq by mouth 2 (two) times daily.    . traMADol (ULTRAM) 50 MG tablet Take 1 tablet (50 mg total) by mouth every 8 (eight) hours as needed. 90 tablet 0  . warfarin (COUMADIN) 2.5 MG tablet TAKE ONE TABLET BY MOUTH AS DIRECTED (Patient taking differently: Takes 2.5 mg daily except 5mg  on Wedneday.) 120 tablet 1   No current facility-administered medications on file prior to visit.     Past Medical History:  Diagnosis Date  . AAA (abdominal aortic aneurysm) ([place])    Surgical repair 11/2002.  . Adenomatous colon polyp   . AICD (automatic cardioverter/defibrillator) present   . Alcohol ingestion of more than four drinks per week    Excess beer  not a dependency problem  . Aortic valve sclerosis   . Arthritis   . Atrial fibrillation ([place])    Previous long-term amiodarone therapy with multiple cardioversions / amiodarone stopped September, 2009  . Atrial flutter [facility])    Started November, 2010, Left-sided and cannot ablate  . Bony abnormality     Patient's manubrium is slightly displaced to the right  . CAD (coronary artery disease)    a. s/p CABG 2004.  . Cardiac resynchronization therapy defibrillator (CRT-D) in place   . Carotid artery disease ([place])    Doppler, December, 2013, 0-39% bilateral  . Chronic systolic CHF (congestive heart failure) ([place])   . Chronotropic incompetence    IV pacing rate adjusted  . CKD (chronic kidney disease), stage III   . COPD (chronic obstructive pulmonary disease) ([place])   . Dilated aortic root ([place])   . Discolored skin   . Diverticulosis   . Drug therapy    Redness and swelling with Avelox infusion May 24, 2011  . Eye abnormality    Ophthalmologist questions a clot in one of his eyes, May, 2012  . Gout   . Hyperlipidemia   . Hypertension   . Internal hemorrhoids   . Ischemic cardiomyopathy   . Left atrial thrombus    Remote past... cardioversions done since that time  . Mitral regurgitation    Mild echo  . Myocardial infarction   . Nasal drainage    Chronic  . Overweight(278.02)   . Pericardial effusion   . Pleural effusion    Large loculated effusion on the left side November, 2011. This was tapped. It was exudative. Cytology revealed no cancer no proof of mesothelioma area pulmonary team felt that no further workup was needed  . Pleural thickening   . Pneumonia   . Polycythemia vera ([place]) 07/28/2014  . SOB (shortness of breath)    Large left effusion/ thoracentesis/hospitalization/November, 2011... exudated.. cytology negative.. Dr.Wert.. no proof of mesothelioma  . Spinal stenosis    Surgery Dr.Elsner  . Thrombophlebitis of superficial veins of upper extremities    Possible venous stenosis from defibrillator  . Type II diabetes mellitus ([place])   . Venous insufficiency    Toe discoloration chronic  . Ventral hernia    April, 2014, result of his abdominal surgery  . Warfarin anticoagulation   . Wide-complex tachycardia [facility])     Past Surgical History:  Procedure  Laterality  Date  . ABDOMINAL AORTIC ANEURYSM REPAIR  11/2002  . ANTERIOR CERVICAL DECOMP/DISCECTOMY FUSION  1995  . BACK SURGERY    . BI-VENTRICULAR PACEMAKER INSERTION (CRT-P)  02-11-2013   Pt with previously implanted MDT CRTD downgraded to CRTP by Dr Lovena Le 02-11-13  . CARDIAC CATHETERIZATION  "several"  . CARDIAC CATHETERIZATION N/A 03/18/2016   Procedure: RIGHT/LEFT HEART CATH AND CORONARY/GRAFT ANGIOGRAPHY;  Surgeon: Nelva Bush, MD;  Location: [facility] CV LAB;  Service: Cardiovascular;  Laterality: N/A;  . CATARACT EXTRACTION W/ INTRAOCULAR LENS  IMPLANT, BILATERAL    . COLONOSCOPY W/ POLYPECTOMY    . CORONARY ANGIOPLASTY WITH STENT PLACEMENT  2004  . CORONARY ARTERY BYPASS GRAFT  2004   CABG X4  . FRACTURE SURGERY    . IMPLANTABLE CARDIOVERTER DEFIBRILLATOR (ICD) GENERATOR CHANGE N/A 02/11/2013   Procedure: ICD GENERATOR CHANGE;  Surgeon: Evans Lance, MD;  Location: [facility] CATH LAB;  Service: Cardiovascular;  Laterality: N/A;  . INCISION AND DRAINAGE ABSCESS / HEMATOMA OF BURSA / KNEE / THIGH    . INSERT / REPLACE / REMOVE PACEMAKER  2009   original pacer/defibrillator; Dr. Lovena Le 2009... by the pacing  . LUMBAR FUSION  2010  . ORIF TIBIA & FIBULA FRACTURES Right 2000s  . PILONIDAL CYST EXCISION    . SPINAL CORD STIMULATOR IMPLANT  12/2011  . SURGERY SCROTAL / TESTICULAR      Social History   Social History  . Marital status: Married    Spouse name: N/A  . Number of children: N/A  . Years of education: N/A   Occupational History  . Retired- Nurse, mental health    Social History Main Topics  . Smoking status: Former Smoker    Packs/day: 3.00    Years: 39.00    Types: Cigarettes    Start date: 05/24/1956    Quit date: 03/22/1995  . Smokeless tobacco: Never Used  . Alcohol use 1.2 oz/week    2 Cans of beer per week  . Drug use: No  . Sexual activity: No   Other Topics Concern  . Not on file   Social History Narrative  . No narrative on file    Family History    Problem Relation Age of Onset  . Hypertension Mother   . Stroke Mother   . Diabetes Father   . Coronary artery disease Father   . Other Father     DVT  . Diabetes Brother   . Colon cancer Neg Hx   . Heart attack Neg Hx     Review of Systems  Constitutional: Negative for fever.  Respiratory: Positive for shortness of breath (chronic at baseline). Negative for cough and wheezing.   Cardiovascular: Positive for leg swelling (same - chronic). Negative for chest pain and palpitations.  Musculoskeletal: Positive for back pain.  Neurological: Negative for light-headedness and headaches.       Objective:   Vitals:   04/13/16 1042  BP: 122/68  Pulse: 81  Resp: 16  Temp: 97.6 F (36.4 C)   Wt Readings from Last 3 Encounters:  04/13/16 253 lb (114.8 kg)  04/11/16 253 lb 9.6 oz (115 kg)  03/30/16 247 lb 12.8 oz (112.4 kg)   Body mass index is 34.31 kg/m.   Physical Exam    Constitutional: Appears well-developed and well-nourished. No distress.  HENT:  Head: Normocephalic and atraumatic.  Neck: Neck supple. No tracheal deviation present. No thyromegaly present.  No cervical lymphadenopathy Cardiovascular: Normal rate, regular rhythm and normal  heart sounds.   No murmur heard. No carotid bruit .  Trace b/l LE edema Pulmonary/Chest: Effort normal and breath sounds normal. No respiratory distress. No has no wheezes. No rales.  Msk: left elbow swollen, warm to touch and tender.  Increased pain with movement of elbow.  Msk: tenderness in lumbar region of lower back Skin: Skin is warm and dry. Not diaphoretic.  Psychiatric: Normal mood and affect. Behavior is normal.      Assessment & Plan:   Will see Dr Tamala Julian today of evaluation of left elbow - gout vs infection   See Problem List for Assessment and Plan of chronic medical problems.

## 2016-04-13 NOTE — Patient Instructions (Signed)
   Medications reviewed and updated.  Changes include increasing tramadol to 100 mg three times daily.    Your prescription(s) have been submitted to your pharmacy. Please take as directed and contact our office if you believe you are having problem(s) with the medication(s).    Please followup in 6 months - physical

## 2016-04-13 NOTE — Assessment & Plan Note (Signed)
Recent hospitalization - on IV lasix and increased on discharge Significant weight loss by discharge Taking lasix daily Still with SOB, but chronic Leg edema controlled following closely up with cardiology

## 2016-04-13 NOTE — Progress Notes (Signed)
[facility] [address], [place] [address] Phone: [phone] Subjective:    I'm seeing this patient by the request  of:  [name], MD   CC: Left elbow swelling  [id number]  [name] is a 76 y.o. male coming in with complaint of left elbow swelling. Patient states that his been going on for approximate one week. Thought it was gout. Took his gout medication with no significant improvement. Recently has been in the hospital for congestive heart failure. Has had difficulty controlling his diabetes as well. Patient saw primary care provider today and was here for further evaluation and treatment. Patient rates that the pain is severe. 9 out of 10. Throbbing aching sensation at all times with a sharp pain with certain movements. Nothing seems to be helping. Denies any injury. Denies any fevers chills or any abnormal weight loss.     Past Medical History:  Diagnosis Date  . AAA (abdominal aortic aneurysm) ([place])    Surgical repair 11/2002.  . Adenomatous colon polyp   . AICD (automatic cardioverter/defibrillator) present   . Alcohol ingestion of more than four drinks per week    Excess beer  not a dependency problem  . Aortic valve sclerosis   . Arthritis   . Atrial fibrillation ([place])    Previous long-term amiodarone therapy with multiple cardioversions / amiodarone stopped September, 2009  . Atrial flutter [facility])    Started November, 2010, Left-sided and cannot ablate  . Bony abnormality    Patient's manubrium is slightly displaced to the right  . CAD (coronary artery disease)    a. s/p CABG 2004.  . Cardiac resynchronization therapy defibrillator (CRT-D) in place   . Carotid artery disease ([place])    Doppler, December, 2013, 0-39% bilateral  . Chronic systolic CHF (congestive heart failure) ([place])   . Chronotropic incompetence    IV pacing rate adjusted  . CKD (chronic kidney disease), stage III   . COPD (chronic obstructive  pulmonary disease) ([place])   . Dilated aortic root ([place])   . Discolored skin   . Diverticulosis   . Drug therapy    Redness and swelling with Avelox infusion May 24, 2011  . Eye abnormality    Ophthalmologist questions a clot in one of his eyes, May, 2012  . Gout   . Hyperlipidemia   . Hypertension   . Internal hemorrhoids   . Ischemic cardiomyopathy   . Left atrial thrombus    Remote past... cardioversions done since that time  . Mitral regurgitation    Mild echo  . Myocardial infarction   . Nasal drainage    Chronic  . Overweight(278.02)   . Pericardial effusion   . Pleural effusion    Large loculated effusion on the left side November, 2011. This was tapped. It was exudative. Cytology revealed no cancer no proof of mesothelioma area pulmonary team felt that no further workup was needed  . Pleural thickening   . Pneumonia   . Polycythemia vera ([place]) 07/28/2014  . SOB (shortness of breath)    Large left effusion/ thoracentesis/hospitalization/November, 2011... exudated.. cytology negative.. Dr.Wert.. no proof of mesothelioma  . Spinal stenosis    Surgery Dr.Elsner  . Thrombophlebitis of superficial veins of upper extremities    Possible venous stenosis from defibrillator  . Type II diabetes mellitus ([place])   . Venous insufficiency    Toe discoloration chronic  . Ventral hernia    April, 2014, result of his  abdominal surgery  . Warfarin anticoagulation   . Wide-complex tachycardia ([place])    Past Surgical History:  Procedure Laterality Date  . ABDOMINAL AORTIC ANEURYSM REPAIR  11/2002  . ANTERIOR CERVICAL DECOMP/DISCECTOMY FUSION  1995  . BACK SURGERY    . BI-VENTRICULAR PACEMAKER INSERTION (CRT-P)  02-11-2013   Pt with previously implanted MDT CRTD downgraded to CRTP by Dr Lovena Le 02-11-13  . CARDIAC CATHETERIZATION  "several"  . CARDIAC CATHETERIZATION N/A 03/18/2016   Procedure: RIGHT/LEFT HEART CATH AND CORONARY/GRAFT ANGIOGRAPHY;  Surgeon: Nelva Bush, MD;   Location: [facility] CV LAB;  Service: Cardiovascular;  Laterality: N/A;  . CATARACT EXTRACTION W/ INTRAOCULAR LENS  IMPLANT, BILATERAL    . COLONOSCOPY W/ POLYPECTOMY    . CORONARY ANGIOPLASTY WITH STENT PLACEMENT  2004  . CORONARY ARTERY BYPASS GRAFT  2004   CABG X4  . FRACTURE SURGERY    . IMPLANTABLE CARDIOVERTER DEFIBRILLATOR (ICD) GENERATOR CHANGE N/A 02/11/2013   Procedure: ICD GENERATOR CHANGE;  Surgeon: Evans Lance, MD;  Location: [facility] CATH LAB;  Service: Cardiovascular;  Laterality: N/A;  . INCISION AND DRAINAGE ABSCESS / HEMATOMA OF BURSA / KNEE / THIGH    . INSERT / REPLACE / REMOVE PACEMAKER  2009   original pacer/defibrillator; Dr. Lovena Le 2009... by the pacing  . LUMBAR FUSION  2010  . ORIF TIBIA & FIBULA FRACTURES Right 2000s  . PILONIDAL CYST EXCISION    . SPINAL CORD STIMULATOR IMPLANT  12/2011  . SURGERY SCROTAL / TESTICULAR     Social History   Social History  . Marital status: Married    Spouse name: N/A  . Number of children: N/A  . Years of education: N/A   Occupational History  . Retired- Nurse, mental health    Social History Main Topics  . Smoking status: Former Smoker    Packs/day: 3.00    Years: 39.00    Types: Cigarettes    Start date: 05/24/1956    Quit date: 03/22/1995  . Smokeless tobacco: Never Used  . Alcohol use 1.2 oz/week    2 Cans of beer per week  . Drug use: No  . Sexual activity: No   Other Topics Concern  . None   Social History Narrative  . None   Allergies  Allergen Reactions  . Avelox [Moxifloxacin Hcl In Nacl] Swelling, Rash and Other (See Comments)    Patient became hypotensive after infusion started Because of a history of documented adverse serious drug reaction;Medi Alert bracelet  is recommended  . Penicillins Anaphylaxis, Other (See Comments) and Swelling    REACTION: anaphylaxis Because of a history of documented adverse serious drug reaction;Medi Alert bracelet  is recommended   Family History  Problem  Relation Age of Onset  . Hypertension Mother   . Stroke Mother   . Diabetes Father   . Coronary artery disease Father   . Other Father     DVT  . Diabetes Brother   . Colon cancer Neg Hx   . Heart attack Neg Hx     Past medical history, social, surgical and family history all reviewed in electronic medical record.  No pertanent information unless stated regarding to the chief complaint.   Review of Systems:Review of systems updated and as accurate as of 04/13/16  No headache, visual changes, nausea, vomiting, diarrhea, constipation, dizziness, abdominal pain, skin rash, fevers, chills, night sweats, weight loss, swollen lymph nodes, body aches, Or mood changes  Objective  Blood pressure 122/68, pulse 81, height 6' (1.829  m), weight 253 lb (114.8 kg), SpO2 96 %. Systems examined below as of 04/13/16   General: No apparent distress alert and oriented x3 mood and affect normal, dressed appropriately.  HEENT: Pupils equal, extraocular movements intact  Respiratory: Patient's speak in full sentences and does not appear short of breath  Cardiovascular: No lower extremity edema, non tender, no erythema  Skin: Warm dry intact with no signs of infection or rash on extremities or on axial skeleton.  Abdomen: Soft nontender  Neuro: Cranial nerves II through XII are intact, neurovascularly intact in all extremities with 2+ DTRs and 2+ pulses.  Lymph: No lymphadenopathy of posterior or anterior cervical chain or axillae bilaterally.  Gait antalgic gait walking with the aid of a cane. MSK:  Non tender with full range of motion and good stability and symmetric strength and tone of shoulders,  wrist, hip, knee and ankles bilaterally. Arthritic changes of multiple joints. Elbow: Left Swelling and effusion noted with soft tissue erythema. Warm to touch. No skin breakdown. Range of motion full pronation, supination, flexion, extension pain though noted with any type of movement.. Strength is full to  all of the above directions Stable to varus, valgus stress. Negative moving valgus stress test. Diffuse tenderness.Marland Kitchen Ulnar nerve does not sublux. Negative cubital tunnel Tinel's. Contralateral elbow unremarkable except for some mild arthritic changes.  Musculoskeletal ultrasound was performed and interpreted by Charlann Boxer D.O.   Elbow:  Significant soft tissue swelling noted. Possible small enlargement of the bursal sac just inferior to the common extensor tendon insertion. Patient also has a joint effusion..  IMPRESSION:  Soft tissue swelling with what appears to be bursa near the lateral epicondylar region as well as within the joint itself.  Procedure: Real-time Ultrasound Guided Injection of left elbow Device: GE Logiq Q7 Ultrasound guided injection is preferred based studies that show increased duration, increased effect, greater accuracy, decreased procedural pain, increased response rate, and decreased cost with ultrasound guided versus blind injection.  Verbal informed consent obtained.  Time-out conducted.  Noted no overlying erythema, induration, or other signs of local infection.  Skin prepped in a sterile fashion.  Local anesthesia: Topical Ethyl chloride.  With sterile technique and under real time ultrasound guidance:  With a 18-gauge 1-1/2 inch needle patient was injected with 1 mL of 0.5% Marcaine and then aspirated 7 mL of strawlike colored fluid. Injected 1 mL of Kenalog 40 mg/dL thereafter. Completed without difficulty  Pain immediately resolved suggesting accurate placement of the medication.  Advised to call if fevers/chills, erythema, induration, drainage, or persistent bleeding.  Images permanently stored and available for review in the ultrasound unit.  Impression: Technically successful ultrasound guided injection.    Impression and Recommendations:     This case required medical decision making of moderate complexity.      Note: This dictation was  prepared with Dragon dictation along with smaller phrase technology. Any transcriptional errors that result from this process are unintentional.

## 2016-04-13 NOTE — Progress Notes (Signed)
Pre visit review using our clinic review tool, if applicable. No additional management support is needed unless otherwise documented below in the visit note. 

## 2016-04-13 NOTE — Assessment & Plan Note (Signed)
Chronic back pain Receives injections  - next injection in two weeks Tramadol 50 mg twice daily not effective Increase tramadol to 100 mg three times daily

## 2016-04-13 NOTE — Assessment & Plan Note (Signed)
BP Readings from Last 3 Encounters:  04/13/16 122/68  04/13/16 122/68  04/11/16 138/80    BP well controlled Current regimen effective and well tolerated Continue current medications at current doses

## 2016-04-13 NOTE — Patient Instructions (Signed)
Good to see you  Ice 20 minutes 2 times daily. Usually after activity and before bed. pennsaid pinkie amount topically 2 times daily as needed.  I would continue the allopurinol as instructed and even do the colchicine 2 times a day for another 3 days.  Watch blood sugars a little closer next 3 days.  Compression sleeve can be helpful.  See me again in 2-3 weeks to make sure it stay down.

## 2016-04-14 ENCOUNTER — Ambulatory Visit (INDEPENDENT_AMBULATORY_CARE_PROVIDER_SITE_OTHER): Payer: Medicare Other | Admitting: Endocrinology

## 2016-04-14 ENCOUNTER — Encounter: Payer: Self-pay | Admitting: Endocrinology

## 2016-04-14 VITALS — BP 112/58 | HR 66 | Ht 72.0 in | Wt 256.0 lb

## 2016-04-14 DIAGNOSIS — Z794 Long term (current) use of insulin: Secondary | ICD-10-CM | POA: Diagnosis not present

## 2016-04-14 DIAGNOSIS — E1165 Type 2 diabetes mellitus with hyperglycemia: Secondary | ICD-10-CM

## 2016-04-14 NOTE — Patient Instructions (Signed)
15 NOVOLOG at meals unless eating larger meal or getting steroid shots

## 2016-04-14 NOTE — Progress Notes (Signed)
Patient ID: [name], male   DOB: 1940/04/23, 76 y.o.   MRN: [id number]    Reason for Appointment : F/u for Type 2 Diabetes  History of Present Illness          Diagnosis: Type 2 diabetes mellitus, date of diagnosis: 2008       Past history: He was initially treated with Metformin and also given Amayl at some point. With this he had fair control of his diabetes with A1c had usually been over 7% except once in 2013.  He was taken off metformin probably in March this year after a hospitalization, possibly because of renal dysfunction He was started on Levemir insulin in May when his blood sugars were significantly higher, fasting readings averaging 220.  Levemir progressively increased and was on 35 units at night since 12/20/12 He has not been on any other medications either orally or injectable for his diabetes Because of his A1c of over 10% in 10/14 on basal insulin alone he was started on NovoLog with each meal in 10/14  Recent history:     INSULIN regimen is described as: Levemir 10 units twice a day, Regular Insulin 20 units twice a day  He is  on Levemir since he could not afford Toujeo Also now on Regular Insulin instead of Humalog also because of high cost  A1c is now better than usual at 6, previously 6.6    Current management, blood sugar patterns and problems identified:  He had been hospitalized in December at early around that time had started having relatively low sugars  His insulin dose has been reduced significantly a special to Levemir which he was taking 40 units a day   His fasting blood sugars are fairly good with blood sugars just over 100 usually.  There also fairly consistent.  He has fair control of her sugars after lunch with some fluctuation  Only once has had a low normal  reading of 67 after suppertime   Again not able to do much physical activity because of his continued back pain   He is very compliant with taking his insulin as  directed 30 minutes before meals  Compliance with the medical regimen: Good   Glucose monitoring:  done  Up to 3  times a day        Glucometer:  Accu-Chek    Blood Glucose readings from download as follows  Mean values apply above for all meters except median for One Touch  PRE-MEAL Fasting Lunch Dinner Bedtime Overall  Glucose range: 81-128    67-175    Mean/median: 106   109  115   POST-MEAL PC Breakfast PC Lunch PC Dinner  Glucose range:  98-199    Mean/median:  144      Self-care: The diet that the patient has been following is: Smaller portions and relatively balanced meals  Meals: 2 meals per day. Noon and 6 pm  Low fat meals At dinnertime. Avoiding drinks with sugar and no juices        Physical activity: exercise: Minimal, limited by back pain           Dietician visit: Most recent: Unknown.  CDE visit: 11/14                  Weight control:    Wt Readings from Last 3 Encounters:  04/14/16 256 lb (116.1 kg)  04/13/16 253 lb (114.8 kg)  04/13/16 253 lb (114.8 kg)  Lab Results  Component Value Date   HGBA1C 6.0 04/11/2016   HGBA1C 6.3 (H) 03/15/2016   HGBA1C 6.6 (H) 01/11/2016   Lab Results  Component Value Date   MICROALBUR 1.2 10/12/2015   LDLCALC 64 03/16/2016   CREATININE 1.32 (H) 04/11/2016     Allergies as of 04/14/2016      Reactions   Avelox [moxifloxacin Hcl In Nacl] Swelling, Rash, Other (See Comments)   Patient became hypotensive after infusion started Because of a history of documented adverse serious drug reaction;Medi Alert bracelet  is recommended   Penicillins Anaphylaxis, Other (See Comments), Swelling   REACTION: anaphylaxis Because of a history of documented adverse serious drug reaction;Medi Alert bracelet  is recommended      Medication List       Accurate as of 04/14/16  2:19 PM. Always use your most recent med list.          ACCU-CHEK AVIVA PLUS test strip Generic drug:  glucose blood USE ONE STRIP TO CHECK GLUCOSE  THREE TIMES DAILY AS DIRECTED   ACCU-CHEK FASTCLIX LANCETS Misc USE ONE  TO CHECK GLUCOSE THREE TIMES DAILY   acetaminophen 325 MG tablet Commonly known as:  TYLENOL Take 325 mg by mouth every 4 (four) hours as needed for moderate pain.   allopurinol 100 MG tablet Commonly known as:  ZYLOPRIM TAKE TWO TABLETS BY MOUTH ONCE DAILY   amLODipine 5 MG tablet Commonly known as:  NORVASC Take 2.5 mg by mouth daily.   aspirin 81 MG tablet Take 81 mg by mouth once a week. Take on sundays   atorvastatin 10 MG tablet Commonly known as:  LIPITOR Take 1 tablet (10 mg total) by mouth daily.   carboxymethylcellulose 0.5 % Soln Commonly known as:  REFRESH PLUS Place 2 drops into both eyes daily.   carvedilol 6.25 MG tablet Commonly known as:  COREG Take 1 tablet (6.25 mg total) by mouth 2 (two) times daily.   Cholecalciferol 1000 units tablet Take 1,000 Units by mouth daily.   colchicine 0.6 MG tablet Take 0.6 mg by mouth daily as needed (gout).   doxycycline 100 MG tablet Commonly known as:  VIBRA-TABS Take 1 tablet (100 mg total) by mouth 2 (two) times daily.   furosemide 80 MG tablet Commonly known as:  LASIX Take 80 mg twice daily. May take additional 40 mg daily prn for weight gain of 2 lbs or more in 24 hour period.   gabapentin 300 MG capsule Commonly known as:  NEURONTIN TAKE ONE CAPSULE BY MOUTH 4 TIMES DAILY AS NEEDED FOR LEG PAIN   ipratropium 0.06 % nasal spray Commonly known as:  ATROVENT Place 2 sprays into both nostrils daily.   LEVEMIR 100 UNIT/ML injection Generic drug:  insulin detemir Inject 10 Units into the skin 2 (two) times daily.   losartan 50 MG tablet Commonly known as:  COZAAR TAKE ONE TABLET BY MOUTH ONCE DAILY   methocarbamol 750 MG tablet Commonly known as:  ROBAXIN Take 750 mg by mouth 2 (two) times daily as needed for muscle spasms.   nitroGLYCERIN 0.4 MG SL tablet Commonly known as:  NITROSTAT Place 1 tablet (0.4 mg total) under the  tongue every 5 (five) minutes as needed for chest pain (MAX 3 TABLETS).   NOVOLIN R 250 units/2.42mL (100 units/mL) injection Generic drug:  insulin regular Inject 20 Units into the skin 2 (two) times daily before a meal.   omeprazole 20 MG capsule Commonly known as:  PRILOSEC Take  20 mg by mouth 2 (two) times daily before a meal.   potassium chloride SA 20 MEQ tablet Commonly known as:  K-DUR,KLOR-CON Take 30 mEq by mouth 2 (two) times daily.   traMADol 50 MG tablet Commonly known as:  ULTRAM Take 2 tablets (100 mg total) by mouth every 8 (eight) hours as needed.   warfarin 2.5 MG tablet Commonly known as:  COUMADIN TAKE ONE TABLET BY MOUTH AS DIRECTED       Allergies:  Allergies  Allergen Reactions  . Avelox [Moxifloxacin Hcl In Nacl] Swelling, Rash and Other (See Comments)    Patient became hypotensive after infusion started Because of a history of documented adverse serious drug reaction;Medi Alert bracelet  is recommended  . Penicillins Anaphylaxis, Other (See Comments) and Swelling    REACTION: anaphylaxis Because of a history of documented adverse serious drug reaction;Medi Alert bracelet  is recommended    Past Medical History:  Diagnosis Date  . AAA (abdominal aortic aneurysm) ([place])    Surgical repair 11/2002.  . Adenomatous colon polyp   . AICD (automatic cardioverter/defibrillator) present   . Alcohol ingestion of more than four drinks per week    Excess beer  not a dependency problem  . Aortic valve sclerosis   . Arthritis   . Atrial fibrillation ([place])    Previous long-term amiodarone therapy with multiple cardioversions / amiodarone stopped September, 2009  . Atrial flutter [facility] [place])    Started November, 2010, Left-sided and cannot ablate  . Bony abnormality    Patient's manubrium is slightly displaced to the right  . CAD (coronary artery disease)    a. s/p CABG 2004.  . Cardiac resynchronization therapy defibrillator (CRT-D) in place   . Carotid artery  disease ([place])    Doppler, December, 2013, 0-39% bilateral  . Chronic systolic CHF (congestive heart failure) ([place])   . Chronotropic incompetence    IV pacing rate adjusted  . CKD (chronic kidney disease), stage III   . COPD (chronic obstructive pulmonary disease) ([place])   . Dilated aortic root ([place])   . Discolored skin   . Diverticulosis   . Drug therapy    Redness and swelling with Avelox infusion May 24, 2011  . Eye abnormality    Ophthalmologist questions a clot in one of his eyes, May, 2012  . Gout   . Hyperlipidemia   . Hypertension   . Internal hemorrhoids   . Ischemic cardiomyopathy   . Left atrial thrombus    Remote past... cardioversions done since that time  . Mitral regurgitation    Mild echo  . Myocardial infarction   . Nasal drainage    Chronic  . Overweight(278.02)   . Pericardial effusion   . Pleural effusion    Large loculated effusion on the left side November, 2011. This was tapped. It was exudative. Cytology revealed no cancer no proof of mesothelioma area pulmonary team felt that no further workup was needed  . Pleural thickening   . Pneumonia   . Polycythemia vera ([place]) 07/28/2014  . SOB (shortness of breath)    Large left effusion/ thoracentesis/hospitalization/November, 2011... exudated.. cytology negative.. Dr.Wert.. no proof of mesothelioma  . Spinal stenosis    Surgery Dr.Elsner  . Thrombophlebitis of superficial veins of upper extremities    Possible venous stenosis from defibrillator  . Type II diabetes mellitus ([place])   . Venous insufficiency    Toe discoloration chronic  . Ventral hernia    April, 2014, result of his abdominal surgery  .  Warfarin anticoagulation   . Wide-complex tachycardia ([place])     Past Surgical History:  Procedure Laterality Date  . ABDOMINAL AORTIC ANEURYSM REPAIR  11/2002  . ANTERIOR CERVICAL DECOMP/DISCECTOMY FUSION  1995  . BACK SURGERY    . BI-VENTRICULAR PACEMAKER INSERTION (CRT-P)  02-11-2013   Pt with  previously implanted MDT CRTD downgraded to CRTP by Dr Lovena Le 02-11-13  . CARDIAC CATHETERIZATION  "several"  . CARDIAC CATHETERIZATION N/A 03/18/2016   Procedure: RIGHT/LEFT HEART CATH AND CORONARY/GRAFT ANGIOGRAPHY;  Surgeon: Nelva Bush, MD;  Location: [place] [facility];  Service: Cardiovascular;  Laterality: N/A;  . CATARACT EXTRACTION W/ INTRAOCULAR LENS  IMPLANT, BILATERAL    . COLONOSCOPY W/ POLYPECTOMY    . CORONARY ANGIOPLASTY WITH STENT PLACEMENT  2004  . CORONARY ARTERY BYPASS GRAFT  2004   CABG X4  . FRACTURE SURGERY    . IMPLANTABLE CARDIOVERTER DEFIBRILLATOR (ICD) GENERATOR CHANGE N/A 02/11/2013   Procedure: ICD GENERATOR CHANGE;  Surgeon: Evans Lance, MD;  Location[facility] C[facility] LAB;  Service: Cardiovascular;  Laterality: N/A;  . INCISION AND DRAINAGE ABSCESS / HEMATOMA OF BURSA / KNEE / THIGH    . INSERT / REPLACE / REMOVE PACEMAKER  2009   original pacer/defibrillator; Dr. Lovena Le 2009... by the pacing  . LUMBAR FUSION  2010  . ORIF TIBIA & FIBULA FRACTURES Right 2000s  . PILONIDAL CYST EXCISION    . SPINAL CORD STIMULATOR IMPLANT  12/2011  . SURGERY SCROTAL / TESTICULAR      Family History  Problem Relation Age of Onset  . Hypertension Mother   . Stroke Mother   . Diabetes Father   . Coronary artery disease Father   . Other Father     DVT  . Diabetes Brother   . Colon cancer Neg Hx   . Heart attack Neg Hx     Social History:  reports that he quit smoking about 21 years ago. His smoking use included Cigarettes. He started smoking about 59 years ago. He has a 117.00 pack-year smoking history. He has never used smokeless tobacco. He reports that he drinks about 1.2 oz of alcohol per week . He reports that he does not use drugs.    Review of Systems       Lipids: Treated with Lipitor  only taking 10 mg, Followed by PCP and cardiologist  LDL is under 100 but not under 70  and also has low HDL   Lab Results  Component Value Date   CHOL 124 03/16/2016   HDL  41 03/16/2016   LDLCALC 64 03/16/2016   TRIG 95 03/16/2016   CHOLHDL 3.0 03/16/2016       The blood pressure has been under control with current regimen of chlorthalidone, amlodipine and losartan Also he takes extra chlorthalidone when he has weight gain  Renal dysfunction: Creatinine has improved recently  Lab Results  Component Value Date   CREATININE 1.32 (H) 04/11/2016   BUN 26 04/11/2016   NA 140 04/11/2016   K 4.0 04/11/2016   CL 103 04/11/2016   CO2 20 04/11/2016      Has known neuropathy: He has had pains and tingling in his feet and lower legs.  Mostly having  Less symptoms in day;  Now he says that he is having much more pain in the mornings in the lower legs when he wakes up Takes gabapentin for this and 1 tablets at suppertime but 2 tablet at bedtime    Physical Examination:  BP Marland Kitchen)  112/58   Pulse 66   Ht 6' (1.829 m)   Wt 256 lb (116.1 kg)   BMI 34.72 kg/m      ASSESSMENT/PLAN:  Diabetes type 2 See history of present illness for detailed discussion of his current management, blood sugar patterns and problems identified    He has excellent control with A1c 6 and recently lower insulin requirement, not clear why he is needing less insulin currently after episode of CHF; however he also has had a significantly decreased appetite recently  He is still very compliant with checking his blood sugars at various times today Currently does not need to change his Levemir since fasting readings are consistently near normal Also since blood sugar may be low normal after meals he can try taking 15 units of regular insulin unless planning to eat a big meal or more carbohydrate Also likely needs 5-10 units more when he has steroid injections  He will call if his blood sugars start going up again  NEUROPATHY:  Symptomatically controlled with improved blood sugar and taking 5 tablets of gabapentin daily in divided doses    Patient Instructions  15 NOVOLOG at  meals unless eating larger meal or getting steroid shots  Counseling time on subjects discussed above is over 50% of today's 25 minute visit   Trennon Torbeck 04/14/2016, 2:19 PM

## 2016-04-15 LAB — SYNOVIAL CELL COUNT + DIFF, W/ CRYSTALS
BASOPHILS, %: 0 %
Eosinophils-Synovial: 0 % (ref 0–2)
LYMPHOCYTES-SYNOVIAL FLD: 4 % (ref 0–74)
Monocyte/Macrophage: 2 % (ref 0–69)
NEUTROPHIL, SYNOVIAL: 94 % — AB (ref 0–24)
Synoviocytes, %: 0 % (ref 0–15)

## 2016-04-25 DIAGNOSIS — M47816 Spondylosis without myelopathy or radiculopathy, lumbar region: Secondary | ICD-10-CM | POA: Diagnosis not present

## 2016-04-27 ENCOUNTER — Telehealth: Payer: Self-pay | Admitting: Interventional Cardiology

## 2016-04-27 NOTE — Telephone Encounter (Signed)
Follow UP;    She wants you to call her,she said you would know what this was about. I was told you were taking your calls and messages,hope I am doing the right thing,

## 2016-04-28 NOTE — Telephone Encounter (Signed)
The pts wife called to report the pts weekly weights. Weights have been recorded.

## 2016-04-29 ENCOUNTER — Other Ambulatory Visit: Payer: Self-pay | Admitting: Endocrinology

## 2016-05-04 ENCOUNTER — Ambulatory Visit (INDEPENDENT_AMBULATORY_CARE_PROVIDER_SITE_OTHER): Payer: Medicare Other | Admitting: *Deleted

## 2016-05-04 DIAGNOSIS — I255 Ischemic cardiomyopathy: Secondary | ICD-10-CM

## 2016-05-04 NOTE — Progress Notes (Signed)
Remote ICD transmission.   

## 2016-05-05 ENCOUNTER — Encounter: Payer: Self-pay | Admitting: Cardiology

## 2016-05-05 ENCOUNTER — Telehealth: Payer: Self-pay | Admitting: Interventional Cardiology

## 2016-05-05 NOTE — Telephone Encounter (Signed)
The pts wife called to report his weekly weights. Weights have been recorded.

## 2016-05-05 NOTE — Telephone Encounter (Signed)
New Message     Returning [place] call

## 2016-05-06 LAB — CUP PACEART REMOTE DEVICE CHECK
Battery Voltage: 2.98 V
Brady Statistic AP VS Percent: 0 %
Brady Statistic AS VP Percent: 4.76 %
Brady Statistic AS VS Percent: 95.24 %
Brady Statistic RA Percent Paced: 0 %
Date Time Interrogation Session: 20180214051805
HIGH POWER IMPEDANCE MEASURED VALUE: 67 Ohm
Implantable Lead Implant Date: 20090121
Implantable Lead Location: 753858
Implantable Lead Location: 753860
Implantable Lead Model: 4193
Implantable Lead Model: 5076
Implantable Lead Model: 6935
Implantable Pulse Generator Implant Date: 20141124
Lead Channel Impedance Value: 399 Ohm
Lead Channel Impedance Value: 4047 Ohm
Lead Channel Impedance Value: 4047 Ohm
Lead Channel Impedance Value: 494 Ohm
Lead Channel Sensing Intrinsic Amplitude: 0.875 mV
Lead Channel Sensing Intrinsic Amplitude: 1 mV
Lead Channel Sensing Intrinsic Amplitude: 13.25 mV
Lead Channel Sensing Intrinsic Amplitude: 13.25 mV
MDC IDC LEAD IMPLANT DT: 20090121
MDC IDC LEAD IMPLANT DT: 20090121
MDC IDC LEAD LOCATION: 753859
MDC IDC MSMT BATTERY REMAINING LONGEVITY: 75 mo
MDC IDC MSMT LEADCHNL RA IMPEDANCE VALUE: 437 Ohm
MDC IDC MSMT LEADCHNL RV IMPEDANCE VALUE: 342 Ohm
MDC IDC MSMT LEADCHNL RV PACING THRESHOLD AMPLITUDE: 0.625 V
MDC IDC MSMT LEADCHNL RV PACING THRESHOLD PULSEWIDTH: 0.4 ms
MDC IDC SET LEADCHNL RV PACING AMPLITUDE: 2.5 V
MDC IDC SET LEADCHNL RV PACING PULSEWIDTH: 0.4 ms
MDC IDC SET LEADCHNL RV SENSING SENSITIVITY: 0.3 mV
MDC IDC STAT BRADY AP VP PERCENT: 0 %
MDC IDC STAT BRADY RV PERCENT PACED: 7.08 %

## 2016-05-12 ENCOUNTER — Encounter: Payer: Self-pay | Admitting: Internal Medicine

## 2016-05-19 DIAGNOSIS — R05 Cough: Secondary | ICD-10-CM | POA: Diagnosis not present

## 2016-05-19 DIAGNOSIS — J209 Acute bronchitis, unspecified: Secondary | ICD-10-CM | POA: Diagnosis not present

## 2016-05-23 ENCOUNTER — Ambulatory Visit (INDEPENDENT_AMBULATORY_CARE_PROVIDER_SITE_OTHER): Payer: Medicare Other | Admitting: Nurse Practitioner

## 2016-05-23 ENCOUNTER — Encounter: Payer: Self-pay | Admitting: Nurse Practitioner

## 2016-05-23 ENCOUNTER — Ambulatory Visit (INDEPENDENT_AMBULATORY_CARE_PROVIDER_SITE_OTHER)
Admission: RE | Admit: 2016-05-23 | Discharge: 2016-05-23 | Disposition: A | Discharge status: Home / Self Care | Payer: Medicare Other | Source: Ambulatory Visit | Attending: Nurse Practitioner | Admitting: Nurse Practitioner

## 2016-05-23 VITALS — BP 106/64 | HR 66 | Temp 97.6°F | Ht 72.0 in | Wt 236.0 lb

## 2016-05-23 DIAGNOSIS — R05 Cough: Secondary | ICD-10-CM | POA: Diagnosis not present

## 2016-05-23 DIAGNOSIS — J209 Acute bronchitis, unspecified: Secondary | ICD-10-CM

## 2016-05-23 DIAGNOSIS — R0989 Other specified symptoms and signs involving the circulatory and respiratory systems: Secondary | ICD-10-CM

## 2016-05-23 MED ORDER — ALBUTEROL SULFATE HFA 108 (90 BASE) MCG/ACT IN AERS: 2.0000 | INHALATION_SPRAY | Freq: Four times a day (QID) | RESPIRATORY_TRACT | 0 refills | Status: DC | PRN

## 2016-05-23 MED ORDER — DOXYCYCLINE HYCLATE 100 MG PO TABS: 100.0000 mg | ORAL_TABLET | Freq: Two times a day (BID) | ORAL | 0 refills | Status: AC

## 2016-05-23 NOTE — Progress Notes (Signed)
Subjective:  Patient ID: [name], male    DOB: 1940-03-25  Age: 76 y.o. MRN: [id number]  CC: Bronchitis (pt stated been coughing up clear/pink for about 1 week. )   Cough  This is a recurrent problem. The current episode started in the past 7 days. The problem has been unchanged. The problem occurs constantly. The cough is productive of sputum. Associated symptoms include shortness of breath and wheezing. Pertinent negatives include no chest pain, chills or fever. The symptoms are aggravated by lying down. He has tried prescription cough suppressant (azithromycin) for the symptoms. The treatment provided mild relief. His past medical history is significant for bronchitis, COPD and pneumonia.    Outpatient Medications Prior to Visit  Medication Sig Dispense Refill  . ACCU-CHEK AVIVA PLUS test strip USE ONE STRIP TO CHECK GLUCOSE THREE TIMES DAILY AS DIRECTED 100 each 3  . ACCU-CHEK FASTCLIX LANCETS MISC USE ONE  TO CHECK GLUCOSE THREE TIMES DAILY 102 each 5  . acetaminophen (TYLENOL) 325 MG tablet Take 325 mg by mouth every 4 (four) hours as needed for moderate pain.     Marland Kitchen allopurinol (ZYLOPRIM) 100 MG tablet TAKE TWO TABLETS BY MOUTH ONCE DAILY 180 tablet 3  . amLODipine (NORVASC) 5 MG tablet Take 2.5 mg by mouth daily.    Marland Kitchen aspirin 81 MG tablet Take 81 mg by mouth once a week. Take on sundays    . atorvastatin (LIPITOR) 10 MG tablet Take 1 tablet (10 mg total) by mouth daily. 90 tablet 3  . carboxymethylcellulose (REFRESH PLUS) 0.5 % SOLN Place 2 drops into both eyes daily.     . carvedilol (COREG) 6.25 MG tablet Take 1 tablet (6.25 mg total) by mouth 2 (two) times daily. 180 tablet 3  . Cholecalciferol 1000 units tablet Take 1,000 Units by mouth daily.    . colchicine 0.6 MG tablet Take 0.6 mg by mouth daily as needed (gout).     . furosemide (LASIX) 80 MG tablet Take 80 mg twice daily. May take additional 40 mg daily prn for weight gain of 2 lbs or more in 24 hour period. 180  tablet 3  . gabapentin (NEURONTIN) 300 MG capsule TAKE ONE CAPSULE BY MOUTH 4 TIMES DAILY AS NEEDED FOR LEG PAIN (Patient taking differently: TAKE ONE CAPSULE BY MOUTH 5 TIMES DAILY AS NEEDED FOR LEG PAIN) 360 capsule 3  . insulin detemir (LEVEMIR) 100 UNIT/ML injection Inject 10 Units into the skin 2 (two) times daily.     . insulin regular (NOVOLIN R) 100 units/mL injection Inject 20 Units into the skin 2 (two) times daily before a meal.     . ipratropium (ATROVENT) 0.06 % nasal spray Place 2 sprays into both nostrils daily.     Marland Kitchen losartan (COZAAR) 50 MG tablet TAKE ONE TABLET BY MOUTH ONCE DAILY 90 tablet 3  . methocarbamol (ROBAXIN) 750 MG tablet Take 750 mg by mouth 2 (two) times daily as needed for muscle spasms.    . nitroGLYCERIN (NITROSTAT) 0.4 MG SL tablet Place 1 tablet (0.4 mg total) under the tongue every 5 (five) minutes as needed for chest pain (MAX 3 TABLETS). 25 tablet 3  . omeprazole (PRILOSEC) 20 MG capsule Take 20 mg by mouth 2 (two) times daily before a meal.    . potassium chloride SA (K-DUR,KLOR-CON) 20 MEQ tablet Take 30 mEq by mouth 2 (two) times daily.    . traMADol (ULTRAM) 50 MG tablet Take 2 tablets (100 mg total) by  mouth every 8 (eight) hours as needed. 180 tablet 2  . warfarin (COUMADIN) 2.5 MG tablet TAKE ONE TABLET BY MOUTH AS DIRECTED (Patient taking differently: takes 1 tablet daily except on wednesdday he takes 2) 120 tablet 1   No facility-administered medications prior to visit.     ROS See HPI  Objective:  BP 106/64   Pulse 66   Temp 97.6 F (36.4 C)   Ht 6' (1.829 m)   Wt 236 lb (107 kg)   SpO2 97%   BMI 32.01 kg/m   BP Readings from Last 3 Encounters:  05/23/16 106/64  04/14/16 (!) 112/58  04/13/16 122/68    Wt Readings from Last 3 Encounters:  05/23/16 236 lb (107 kg)  04/14/16 256 lb (116.1 kg)  04/13/16 253 lb (114.8 kg)    Physical Exam  Constitutional: He is oriented to person, place, and time. No distress.  Neck: Normal range  of motion. Neck supple.  Cardiovascular: Normal rate and regular rhythm.   Pulmonary/Chest: Effort normal and breath sounds normal.  Lymphadenopathy:    He has no cervical adenopathy.  Neurological: He is alert and oriented to person, place, and time.  Skin: Skin is warm and dry.  Vitals reviewed.   Lab Results  Component Value Date   WBC 7.2 03/30/2016   HGB 12.7 (L) 03/23/2016   HCT 41.6 03/30/2016   PLT 211 03/30/2016   GLUCOSE 128 (H) 04/11/2016   CHOL 124 03/16/2016   TRIG 95 03/16/2016   HDL 41 03/16/2016   LDLCALC 64 03/16/2016   ALT 11 03/30/2016   AST 12 03/30/2016   NA 140 04/11/2016   K 4.0 04/11/2016   CL 103 04/11/2016   CREATININE 1.32 (H) 04/11/2016   BUN 26 04/11/2016   CO2 20 04/11/2016   TSH 0.59 10/07/2014   PSA 0.33 01/07/2010   INR 2.2 04/11/2016   HGBA1C 6.0 04/11/2016   MICROALBUR 1.2 10/12/2015    Ct Extrem Up Entire Arm R Wo/cm  Result Date: 03/30/2016 CLINICAL DATA:  One week history of increased swelling, erythema and tenderness in the right arm. Recent IV in that arm during hospitalization. On Coumadin therapy. Evaluate for hematoma. EXAM: CT OF THE UPPER RIGHT EXTREMITY WITHOUT CONTRAST TECHNIQUE: Multidetector CT imaging of the upper right extremity was performed according to the standard protocol. COMPARISON:  Previous examination 03/28/2011. FINDINGS: Bones/Joint/Cartilage No evidence of acute fracture, dislocation or bone destruction. Mild glenohumeral degenerative changes are stable. There is mild spurring of the olecranon process. No significant shoulder or elbow joint effusion. Ligaments Not relevant for exam/indication. Muscles and Tendons No focal hematoma or other fluid collection identified in the upper arm or forearm. Soft tissues There is subcutaneous edema throughout the forearm, greatest dorsally and medially. No focal fluid collection, foreign body or soft tissue emphysema identified. There is scattered atherosclerosis, predominately  in the right axillary and mid brachial artery. There are more diffuse vascular calcifications in the forearm. IMPRESSION: 1. No focal hematoma or other fluid collection identified within the right upper extremity. 2. Nonspecific subcutaneous edema throughout the forearm, potentially cellulitis. 3. No acute osseous findings or evidence of osteomyelitis. Electronically Signed   By: Richardean Sale M.D.   On: 03/30/2016 14:11    Assessment & Plan:   Riku was seen today for bronchitis.  Diagnoses and all orders for this visit:  Acute bronchitis, unspecified organism -     DG Chest 2 View; Future -     albuterol (PROVENTIL HFA;VENTOLIN HFA)  108 (90 Base) MCG/ACT inhaler; Inhale 2 puffs into the lungs every 6 (six) hours as needed for wheezing or shortness of breath. -     doxycycline (VIBRA-TABS) 100 MG tablet; Take 1 tablet (100 mg total) by mouth 2 (two) times daily.  Abnormal lung sounds -     DG Chest 2 View; Future -     albuterol (PROVENTIL HFA;VENTOLIN HFA) 108 (90 Base) MCG/ACT inhaler; Inhale 2 puffs into the lungs every 6 (six) hours as needed for wheezing or shortness of breath. -     doxycycline (VIBRA-TABS) 100 MG tablet; Take 1 tablet (100 mg total) by mouth 2 (two) times daily.   I have discontinued Mr. Heon azithromycin. I am also having him start on albuterol and doxycycline. Additionally, I am having him maintain his ipratropium, aspirin, amLODipine, carboxymethylcellulose, acetaminophen, nitroGLYCERIN, carvedilol, atorvastatin, ACCU-CHEK FASTCLIX LANCETS, gabapentin, insulin detemir, insulin regular, omeprazole, colchicine, warfarin, methocarbamol, losartan, Cholecalciferol, potassium chloride SA, furosemide, traMADol, allopurinol, ACCU-CHEK AVIVA PLUS, and guaiFENesin-codeine.  Meds ordered this encounter  Medications  . guaiFENesin-codeine (VIRTUSSIN A/C) 100-10 MG/5ML syrup    Sig: Take 5 mLs by mouth 3 (three) times daily as needed for cough.  . DISCONTD:  azithromycin (ZITHROMAX) 250 MG tablet  . albuterol (PROVENTIL HFA;VENTOLIN HFA) 108 (90 Base) MCG/ACT inhaler    Sig: Inhale 2 puffs into the lungs every 6 (six) hours as needed for wheezing or shortness of breath.    Dispense:  1 Inhaler    Refill:  0    Order Specific Question:   Supervising Provider    Answer:   Cassandria Anger [[id number]]  . doxycycline (VIBRA-TABS) 100 MG tablet    Sig: Take 1 tablet (100 mg total) by mouth 2 (two) times daily.    Dispense:  14 tablet    Refill:  0    Order Specific Question:   Supervising Provider    Answer:   Cassandria Anger [[id number]]    Follow-up: Return in about 1 week (around 05/30/2016) for cough and SOB with Dr. Quay Burow.  Wilfred Lacy, NP

## 2016-05-23 NOTE — Patient Instructions (Signed)
Stable CXR with persistent LLL effusion vs scarring Treated with doxycycline and ventolin Consider chest CT if no improvement in 1week.

## 2016-05-24 ENCOUNTER — Telehealth: Payer: Self-pay | Admitting: Nurse Practitioner

## 2016-05-24 ENCOUNTER — Telehealth: Payer: Self-pay

## 2016-05-24 ENCOUNTER — Telehealth: Payer: Self-pay | Admitting: Endocrinology

## 2016-05-24 ENCOUNTER — Encounter: Payer: Self-pay | Admitting: Interventional Cardiology

## 2016-05-24 MED ORDER — ALBUTEROL SULFATE 108 (90 BASE) MCG/ACT IN AEPB: 2.0000 | INHALATION_SPRAY | Freq: Four times a day (QID) | RESPIRATORY_TRACT | 0 refills | Status: DC | PRN

## 2016-05-24 MED ORDER — BENZONATATE 100 MG PO CAPS: 100.0000 mg | ORAL_CAPSULE | Freq: Three times a day (TID) | ORAL | 0 refills | Status: DC | PRN

## 2016-05-24 NOTE — Telephone Encounter (Signed)
This has been resolved spoke with the patients wife and gave her the instructions

## 2016-05-24 NOTE — Telephone Encounter (Signed)
He can reduce his LEVEMIR from 10 units twice a day down to 8 units twice a day Also reduce his mealtime insulin from 15 units down to 10 units before meals

## 2016-05-24 NOTE — Telephone Encounter (Signed)
Left vm for pt to call back, need to inform that proair respiclick and benzonatate sent in to [facility] for pt.   Also need to inform pt to let the coumadin clinic know that he is on antibiotic and follow up with Dr. Quay Burow in 1 week.

## 2016-05-24 NOTE — Telephone Encounter (Signed)
[place] at [facility] told him yesterday to stop his insulin until her contacts Dr. Dwyane Dee. Blood sugar low. He's losing weight  Please advise patient if they need to be seen.

## 2016-05-24 NOTE — Telephone Encounter (Signed)
Low blood sugar at regular doctor and she said she was told to talk to his endocrinologist- readings are as follows: Am:[id number] After supper- [phone] Wife feels his lows have been worse than usual and his primary doctor told him not to take any insulin until she has spoken with endocrinologist- please advise

## 2016-05-24 NOTE — Telephone Encounter (Signed)
Patient has been made aware.

## 2016-05-25 NOTE — Telephone Encounter (Signed)
Call pt again but no answer   Left detail massage inform pt of massage below.

## 2016-05-27 ENCOUNTER — Ambulatory Visit (INDEPENDENT_AMBULATORY_CARE_PROVIDER_SITE_OTHER): Payer: Medicare Other | Admitting: Internal Medicine

## 2016-05-27 ENCOUNTER — Encounter: Payer: Self-pay | Admitting: Internal Medicine

## 2016-05-27 VITALS — BP 134/76 | HR 75 | Temp 97.5°F | Resp 16 | Wt 233.0 lb

## 2016-05-27 DIAGNOSIS — J209 Acute bronchitis, unspecified: Secondary | ICD-10-CM | POA: Diagnosis not present

## 2016-05-27 DIAGNOSIS — R9389 Abnormal findings on diagnostic imaging of other specified body structures: Secondary | ICD-10-CM

## 2016-05-27 DIAGNOSIS — R938 Abnormal findings on diagnostic imaging of other specified body structures: Secondary | ICD-10-CM | POA: Diagnosis not present

## 2016-05-27 DIAGNOSIS — J441 Chronic obstructive pulmonary disease with (acute) exacerbation: Secondary | ICD-10-CM | POA: Diagnosis not present

## 2016-05-27 MED ORDER — GUAIFENESIN-CODEINE 100-10 MG/5ML PO SYRP: 5.0000 mL | ORAL_SOLUTION | Freq: Three times a day (TID) | ORAL | 0 refills | Status: DC | PRN

## 2016-05-27 MED ORDER — PREDNISONE 10 MG PO TABS: ORAL_TABLET | ORAL | 0 refills | Status: DC

## 2016-05-27 NOTE — Progress Notes (Signed)
Pre visit review using our clinic review tool, if applicable. No additional management support is needed unless otherwise documented below in the visit note. 

## 2016-05-27 NOTE — Progress Notes (Signed)
Subjective:    Patient ID: [name], male    DOB: 1941-03-17, 76 y.o.   MRN: [id number]  HPI He is here for follow up.  He saw Baldo Ash 4 days ago and was diagnosed with bronchitis.  He is not feeling much better.   Hs is taking tessalon perles, cough syrup and the inhaler. He is taking mucinex.  He is taking doxycycline.    He is still coughing and it is slightly productive at times. He states a little  wheeze and sob.  He has decreased appetite, fatigue and body aches.  He denies any fevers, ear pain, sinus pain, sore throat, GI symptoms and headaches.    CXR  05/23/16:  IMPRESSION: Stable cardiac enlargement and tortuous calcified thoracic aorta.  Persistent left lower lobe process likely a combination of scarring, effusion, rounded atelectasis and pleural calcification. Chest CT may be helpful for further evaluation.  Medications and allergies reviewed with patient and updated if appropriate.  Patient Active Problem List   Diagnosis Date Noted  . Effusion of left elbow 04/13/2016  . Acute on chronic congestive heart failure ([place]) 04/13/2016  . Angina pectoris ([place]) 03/15/2016  . History of gout 10/09/2015  . Polycythemia vera ([place]) 07/28/2014  . Ascending aortic aneurysm ([place]) 05/10/2014  . Hypoxia 05/05/2014  . COPD exacerbation ([place]) 05/05/2014  . Diabetes ([place]) 05/05/2014  . Chronic systolic CHF (congestive heart failure) ([place]) 03/07/2014  . History of DVT (deep vein thrombosis) 03/07/2014  . Cardiomyopathy, ischemic 02/05/2014  . Pleural plaque without asbestosis 01/23/2014  . Elevated hemoglobin ([place]) 01/14/2014  . CKD (chronic kidney disease) stage 3, GFR 30-59 ml/min 01/14/2014  . Asbestos exposure 01/14/2014  . Shortness of breath 01/01/2014  . Ejection fraction   . Hypopotassemia 12/20/2012  . ACE-inhibitor cough 10/04/2012  . Ventral hernia   . Bony abnormality   . Carotid artery disease ([place])   . Spinal cord stimulator status   . Peripheral  vascular disease with claudication ([place]) 08/31/2011  . Atrial fibrillation ([place])   . Spinal stenosis   . CAD (coronary artery disease)   . Venous insufficiency   . Mitral regurgitation   . Aortic valve sclerosis   . Alcohol ingestion of more than four drinks per week   . Chronotropic incompetence   . Thrombophlebitis of superficial veins of upper extremities   . Pericardial effusion   . Overweight(278.02)   . Pleural effusion   . Hyperlipidemia   . Hypertension   . Atrial flutter ([place])   . Left atrial thrombus   . AAA (abdominal aortic aneurysm) ([place])   . S/P ICD (internal cardiac defibrillator) procedure   . SOB (shortness of breath)   . Warfarin anticoagulation   . Diabetic polyneuropathy ([place]) 01/07/2010  . DIVERTICULOSIS, COLON 06/10/2009  . Uncontrolled diabetes mellitus with peripheral circulatory disorder ([place]) 03/11/2009  . BACK PAIN, LUMBAR 06/12/2008  . 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 Acute gout 07/27/2006  . CORONARY ARTERY BYPASS GRAFT, HX OF 07/27/2006    Current Outpatient Prescriptions on File Prior to Visit  Medication Sig Dispense Refill  . ACCU-CHEK AVIVA PLUS test strip USE ONE STRIP TO CHECK GLUCOSE THREE TIMES DAILY AS DIRECTED 100 each 3  . ACCU-CHEK FASTCLIX LANCETS MISC USE ONE  TO CHECK GLUCOSE THREE TIMES DAILY 102 each 5  . acetaminophen (TYLENOL) 325 MG tablet Take 325 mg by mouth every 4 (four) hours as needed for moderate pain.     Marland Kitchen  Albuterol Sulfate (PROAIR RESPICLICK) 025 (90 Base) MCG/ACT AEPB Inhale 2 puffs into the lungs every 6 (six) hours as needed. For wheezing or shortness of breath. 1 each 0  . allopurinol (ZYLOPRIM) 100 MG tablet TAKE TWO TABLETS BY MOUTH ONCE DAILY 180 tablet 3  . amLODipine (NORVASC) 5 MG tablet Take 2.5 mg by mouth daily.    Marland Kitchen aspirin 81 MG tablet Take 81 mg by mouth once a week. Take on sundays    . atorvastatin (LIPITOR) 10 MG tablet Take 1 tablet (10 mg total) by mouth daily. 90 tablet 3    . benzonatate (TESSALON) 100 MG capsule Take 1 capsule (100 mg total) by mouth 3 (three) times daily as needed for cough. 30 capsule 0  . carboxymethylcellulose (REFRESH PLUS) 0.5 % SOLN Place 2 drops into both eyes daily.     . carvedilol (COREG) 6.25 MG tablet Take 1 tablet (6.25 mg total) by mouth 2 (two) times daily. 180 tablet 3  . Cholecalciferol 1000 units tablet Take 1,000 Units by mouth daily.    . colchicine 0.6 MG tablet Take 0.6 mg by mouth daily as needed (gout).     Marland Kitchen doxycycline (VIBRA-TABS) 100 MG tablet Take 1 tablet (100 mg total) by mouth 2 (two) times daily. 14 tablet 0  . furosemide (LASIX) 80 MG tablet Take 80 mg twice daily. May take additional 40 mg daily as needed for weight gain of 2 lbs or more in 24 hour period. 180 tablet 3  . gabapentin (NEURONTIN) 300 MG capsule TAKE ONE CAPSULE BY MOUTH 4 TIMES DAILY AS NEEDED FOR LEG PAIN 360 capsule 3  . guaiFENesin-codeine (VIRTUSSIN A/C) 100-10 MG/5ML syrup Take 5 mLs by mouth 3 (three) times daily as needed for cough.    . insulin detemir (LEVEMIR) 100 UNIT/ML injection Inject 10 Units into the skin 2 (two) times daily.     . insulin regular (NOVOLIN R) 100 units/mL injection Inject 20 Units into the skin 2 (two) times daily before a meal.     . ipratropium (ATROVENT) 0.06 % nasal spray Place 2 sprays into both nostrils daily.     Marland Kitchen losartan (COZAAR) 50 MG tablet TAKE ONE TABLET BY MOUTH ONCE DAILY 90 tablet 3  . methocarbamol (ROBAXIN) 750 MG tablet Take 750 mg by mouth 2 (two) times daily as needed for muscle spasms.    . nitroGLYCERIN (NITROSTAT) 0.4 MG SL tablet Place 1 tablet (0.4 mg total) under the tongue every 5 (five) minutes as needed for chest pain (MAX 3 TABLETS). 25 tablet 3  . omeprazole (PRILOSEC) 20 MG capsule Take 20 mg by mouth 2 (two) times daily before a meal.    . potassium chloride SA (K-DUR,KLOR-CON) 20 MEQ tablet Take 30 mEq by mouth 2 (two) times daily.    . traMADol (ULTRAM) 50 MG tablet Take 2 tablets  (100 mg total) by mouth every 8 (eight) hours as needed. 180 tablet 2  . warfarin (COUMADIN) 2.5 MG tablet TAKE ONE TABLET BY MOUTH AS DIRECTED (Patient taking differently: takes 1 tablet daily except on wednesdday he takes 2) 120 tablet 1   No current facility-administered medications on file prior to visit.     Past Medical History:  Diagnosis Date  . AAA (abdominal aortic aneurysm) ([place])    Surgical repair 11/2002.  . Adenomatous colon polyp   . AICD (automatic cardioverter/defibrillator) present   . Alcohol ingestion of more than four drinks per week    Excess beer  not  a dependency problem  . Aortic valve sclerosis   . Arthritis   . Atrial fibrillation ([place])    Previous long-term amiodarone therapy with multiple cardioversions / amiodarone stopped September, 2009  . Atrial flutter [facility])    Started November, 2010, Left-sided and cannot ablate  . Bony abnormality    Patient's manubrium is slightly displaced to the right  . CAD (coronary artery disease)    a. s/p CABG 2004.  . Cardiac resynchronization therapy defibrillator (CRT-D) in place   . Carotid artery disease ([place])    Doppler, December, 2013, 0-39% bilateral  . Chronic systolic CHF (congestive heart failure) ([place])   . Chronotropic incompetence    IV pacing rate adjusted  . CKD (chronic kidney disease), stage III   . COPD (chronic obstructive pulmonary disease) ([place])   . Dilated aortic root ([place])   . Discolored skin   . Diverticulosis   . Drug therapy    Redness and swelling with Avelox infusion May 24, 2011  . Eye abnormality    Ophthalmologist questions a clot in one of his eyes, May, 2012  . Gout   . Hyperlipidemia   . Hypertension   . Internal hemorrhoids   . Ischemic cardiomyopathy   . Left atrial thrombus    Remote past... cardioversions done since that time  . Mitral regurgitation    Mild echo  . Myocardial infarction   . Nasal drainage    Chronic  . Overweight(278.02)   . Pericardial effusion   .  Pleural effusion    Large loculated effusion on the left side November, 2011. This was tapped. It was exudative. Cytology revealed no cancer no proof of mesothelioma area pulmonary team felt that no further workup was needed  . Pleural thickening   . Pneumonia   . Polycythemia vera ([place]) 07/28/2014  . SOB (shortness of breath)    Large left effusion/ thoracentesis/hospitalization/November, 2011... exudated.. cytology negative.. Dr.Wert.. no proof of mesothelioma  . Spinal stenosis    Surgery Dr.Elsner  . Thrombophlebitis of superficial veins of upper extremities    Possible venous stenosis from defibrillator  . Type II diabetes mellitus ([place])   . Venous insufficiency    Toe discoloration chronic  . Ventral hernia    April, 2014, result of his abdominal surgery  . Warfarin anticoagulation   . Wide-complex tachycardia ([place])     Past Surgical History:  Procedure Laterality Date  . ABDOMINAL AORTIC ANEURYSM REPAIR  11/2002  . ANTERIOR CERVICAL DECOMP/DISCECTOMY FUSION  1995  . BACK SURGERY    . BI-VENTRICULAR PACEMAKER INSERTION (CRT-P)  02-11-2013   Pt with previously implanted MDT CRTD downgraded to CRTP by Dr Lovena Le 02-11-13  . CARDIAC CATHETERIZATION  "several"  . CARDIAC CATHETERIZATION N/A 03/18/2016   Procedure: RIGHT/LEFT HEART CATH AND CORONARY/GRAFT ANGIOGRAPHY;  Surgeon: Nelva Bush, MD;  Location: [facility];  Service: Cardiovascular;  Laterality: N/A;  . CATARACT EXTRACTION W/ INTRAOCULAR LENS  IMPLANT, BILATERAL    . COLONOSCOPY W/ POLYPECTOMY    . CORONARY ANGIOPLASTY WITH STENT PLACEMENT  2004  . CORONARY ARTERY BYPASS GRAFT  2004   CABG X4  . FRACTURE SURGERY    . IMPLANTABLE CARDIOVERTER DEFIBRILLATOR (ICD) GENERATOR CHANGE N/A 02/11/2013   Procedure: ICD GENERATOR CHANGE;  Surgeon: Evans Lance, MD;  Location: [facility] CATH LAB;  Service: Cardiovascular;  Laterality: N/A;  . INCISION AND DRAINAGE ABSCESS / HEMATOMA OF BURSA / KNEE / THIGH    . INSERT /  REPLACE / REMOVE  PACEMAKER  2009   original pacer/defibrillator; Dr. Lovena Le 2009... by the pacing  . LUMBAR FUSION  2010  . ORIF TIBIA & FIBULA FRACTURES Right 2000s  . PILONIDAL CYST EXCISION    . SPINAL CORD STIMULATOR IMPLANT  12/2011  . SURGERY SCROTAL / TESTICULAR      Social History   Social History  . Marital status: Married    Spouse name: N/A  . Number of children: N/A  . Years of education: N/A   Occupational History  . Retired- Nurse, mental health    Social History Main Topics  . Smoking status: Former Smoker    Packs/day: 3.00    Years: 39.00    Types: Cigarettes    Start date: 05/24/1956    Quit date: 03/22/1995  . Smokeless tobacco: Never Used  . Alcohol use 1.2 oz/week    2 Cans of beer per week  . Drug use: No  . Sexual activity: No   Other Topics Concern  . None   Social History Narrative  . None    Family History  Problem Relation Age of Onset  . Hypertension Mother   . Stroke Mother   . Diabetes Father   . Coronary artery disease Father   . Other Father     DVT  . Diabetes Brother   . Colon cancer Neg Hx   . Heart attack Neg Hx     Review of Systems  Constitutional: Positive for appetite change and fatigue. Negative for chills and fever.  HENT: Negative for ear pain, sinus pain, sinus pressure and sore throat.   Respiratory: Positive for cough, shortness of breath and wheezing.   Gastrointestinal: Negative for abdominal pain, diarrhea and nausea.  Musculoskeletal: Positive for myalgias (more than usual).  Neurological: Negative for light-headedness and headaches.       Objective:   Vitals:   05/27/16 1621  BP: 134/76  Pulse: 75  Resp: 16  Temp: 97.5 F (36.4 C)   Filed Weights   05/27/16 1621  Weight: 233 lb (105.7 kg)   Body mass index is 31.6 kg/m.  Wt Readings from Last 3 Encounters:  05/27/16 233 lb (105.7 kg)  05/23/16 236 lb (107 kg)  04/14/16 256 lb (116.1 kg)     Physical Exam GENERAL APPEARANCE:  Appears stated age, well appearing, NAD EYES: conjunctiva clear, no icterus HEENT: bilateral tympanic membranes and ear canals normal, oropharynx with mild erythema, no thyromegaly, trachea midline, no cervical or supraclavicular lymphadenopathy LUNGS: Uunlabored breathing, good air entry bilaterally. Fine expiratory wheeze at end of expiration - diffuse, no crackles HEART: Normal S1,S2 without murmurs EXTREMITIES: mild edema        Assessment & Plan:

## 2016-05-27 NOTE — Patient Instructions (Signed)
A CT scan was ordered.    Medications reviewed and updated.  Changes include starting a prednisone taper.  Continue everything else.   Your prescription(s) have been submitted to your pharmacy. Please take as directed and contact our office if you believe you are having problem(s) with the medication(s).

## 2016-05-29 DIAGNOSIS — J209 Acute bronchitis, unspecified: Secondary | ICD-10-CM | POA: Insufficient documentation

## 2016-05-29 DIAGNOSIS — R9389 Abnormal findings on diagnostic imaging of other specified body structures: Secondary | ICD-10-CM | POA: Insufficient documentation

## 2016-05-29 NOTE — Assessment & Plan Note (Signed)
Minimal if any improvement since Monday Given abnormal CXR will get a CT scan of his lungs Will add steroids and hopefully that will help him feel better Continue doxycycline and cough medications They will call Monday with an update on how he is feeling

## 2016-05-29 NOTE — Assessment & Plan Note (Signed)
Will add steroids and hopefully that will help him feel better Continue doxycycline and cough medications They will call Monday with an update on how he is feeling

## 2016-05-29 NOTE — Assessment & Plan Note (Signed)
Not feeling much better after 3-4 days of antibiotics Given abnormality on CXR will get a CT scan of his lungs - will likely be done early next week

## 2016-05-30 ENCOUNTER — Ambulatory Visit (INDEPENDENT_AMBULATORY_CARE_PROVIDER_SITE_OTHER)
Admission: RE | Admit: 2016-05-30 | Discharge: 2016-05-30 | Disposition: A | Discharge status: Home / Self Care | Payer: Medicare Other | Source: Ambulatory Visit | Attending: Internal Medicine | Admitting: Internal Medicine

## 2016-05-30 ENCOUNTER — Encounter: Payer: Self-pay | Admitting: Internal Medicine

## 2016-05-30 DIAGNOSIS — R9389 Abnormal findings on diagnostic imaging of other specified body structures: Secondary | ICD-10-CM

## 2016-05-30 DIAGNOSIS — R938 Abnormal findings on diagnostic imaging of other specified body structures: Secondary | ICD-10-CM

## 2016-05-30 DIAGNOSIS — J90 Pleural effusion, not elsewhere classified: Secondary | ICD-10-CM | POA: Diagnosis not present

## 2016-05-31 ENCOUNTER — Ambulatory Visit (INDEPENDENT_AMBULATORY_CARE_PROVIDER_SITE_OTHER): Payer: Medicare Other | Admitting: *Deleted

## 2016-05-31 ENCOUNTER — Encounter: Payer: Self-pay | Admitting: Interventional Cardiology

## 2016-05-31 ENCOUNTER — Ambulatory Visit (INDEPENDENT_AMBULATORY_CARE_PROVIDER_SITE_OTHER): Payer: Medicare Other | Admitting: Interventional Cardiology

## 2016-05-31 VITALS — BP 122/80 | HR 63 | Ht 72.0 in | Wt 232.0 lb

## 2016-05-31 DIAGNOSIS — I4821 Permanent atrial fibrillation: Secondary | ICD-10-CM

## 2016-05-31 DIAGNOSIS — M47816 Spondylosis without myelopathy or radiculopathy, lumbar region: Secondary | ICD-10-CM | POA: Diagnosis not present

## 2016-05-31 DIAGNOSIS — D45 Polycythemia vera: Secondary | ICD-10-CM

## 2016-05-31 DIAGNOSIS — I482 Chronic atrial fibrillation: Secondary | ICD-10-CM

## 2016-05-31 DIAGNOSIS — I712 Thoracic aortic aneurysm, without rupture: Secondary | ICD-10-CM

## 2016-05-31 DIAGNOSIS — E1159 Type 2 diabetes mellitus with other circulatory complications: Secondary | ICD-10-CM

## 2016-05-31 DIAGNOSIS — I7121 Aneurysm of the ascending aorta, without rupture: Secondary | ICD-10-CM

## 2016-05-31 DIAGNOSIS — Z794 Long term (current) use of insulin: Secondary | ICD-10-CM

## 2016-05-31 DIAGNOSIS — I5022 Chronic systolic (congestive) heart failure: Secondary | ICD-10-CM

## 2016-05-31 DIAGNOSIS — I2510 Atherosclerotic heart disease of native coronary artery without angina pectoris: Secondary | ICD-10-CM

## 2016-05-31 LAB — POCT INR: INR: 3.6

## 2016-05-31 NOTE — Progress Notes (Signed)
Cardiology Office Note   Date:  05/31/2016   ID:  [name], DOB [birth year], MRN [id number]  PCP:  [name], MD    No chief complaint on file. CAD   Wt Readings from Last 3 Encounters:  05/31/16 232 lb (105.2 kg)  05/27/16 233 lb (105.7 kg)  05/23/16 236 lb (107 kg)       History of Present Illness: [name] is a 76 y.o. male  Who prefers to be called "[name]," who has a h/o CAD, MI. He had diaphoresis and diffuse pain with his MI. He ended up having CABG. His EF is in the 35-40% range. He has been managed for chronic left ventricular systolic dysfunction over the last several years by Dr. [name]. He has had issues with back pain and has had several injections. He weighs every day. He is in contact with my nurse Jeani Hawking regarding his weight. When his weight goes above 255 pounds, he takes a chlorthalidone tablet.  He denies any chest discomfort or shortness of breath. He is not having any trouble lying flat. He is not having any lower extremity edema.  He has been using the chlorthalidone more frequently. About every third day, he is using the chlorthalidone he does not feel short of breath or particularly swollen when he has to use the chlorthalidone. He is concerned about his kidney function.  He does not exercise due to chronic back pain. He has had multiple surgeries. He has used a recumbent bike in the past with some success.  In Dec 2017, he had some angina and had a cath.  He was medically managed with thoughts of FFR of the left main if medical therapy was insufficient.  He did well for several months with medical therapy.  He has had some flutters in his chest.  No chest pain.   He has had some illness recently and lost weight.   BP has been well controlled.    He recently had a CT showing a thoracic aneurysm of 4.9 cm.  Small pleural effusions.  He has not had to use extra diuretic.  He has lost significant weight since his last visit.  No NTG  use.    Past Medical History:  Diagnosis Date  . AAA (abdominal aortic aneurysm) ([place])    Surgical repair 11/2002.  . Adenomatous colon polyp   . AICD (automatic cardioverter/defibrillator) present   . Alcohol ingestion of more than four drinks per week    Excess beer  not a dependency problem  . Aortic valve sclerosis   . Arthritis   . Atrial fibrillation ([place])    Previous long-term amiodarone therapy with multiple cardioversions / amiodarone stopped September, 2009  . Atrial flutter [facility])    Started November, 2010, Left-sided and cannot ablate  . Bony abnormality    Patient's manubrium is slightly displaced to the right  . CAD (coronary artery disease)    a. s/p CABG 2004.  . Cardiac resynchronization therapy defibrillator (CRT-D) in place   . Carotid artery disease ([place])    Doppler, December, 2013, 0-39% bilateral  . Chronic systolic CHF (congestive heart failure) ([place])   . Chronotropic incompetence    IV pacing rate adjusted  . CKD (chronic kidney disease), stage III   . COPD (chronic obstructive pulmonary disease) ([place])   . Dilated aortic root ([place])   . Discolored skin   . Diverticulosis   . Drug therapy    Redness and swelling with  Avelox infusion May 24, 2011  . Eye abnormality    Ophthalmologist questions a clot in one of his eyes, May, 2012  . Gout   . Hyperlipidemia   . Hypertension   . Internal hemorrhoids   . Ischemic cardiomyopathy   . Left atrial thrombus    Remote past... cardioversions done since that time  . Mitral regurgitation    Mild echo  . Myocardial infarction   . Nasal drainage    Chronic  . Overweight(278.02)   . Pericardial effusion   . Pleural effusion    Large loculated effusion on the left side November, 2011. This was tapped. It was exudative. Cytology revealed no cancer no proof of mesothelioma area pulmonary team felt that no further workup was needed  . Pleural thickening   . Pneumonia   . Polycythemia vera ([place]) 07/28/2014  . SOB  (shortness of breath)    Large left effusion/ thoracentesis/hospitalization/November, 2011... exudated.. cytology negative.. Dr.Wert.. no proof of mesothelioma  . Spinal stenosis    Surgery Dr.Elsner  . Thrombophlebitis of superficial veins of upper extremities    Possible venous stenosis from defibrillator  . Type II diabetes mellitus ([place])   . Venous insufficiency    Toe discoloration chronic  . Ventral hernia    April, 2014, result of his abdominal surgery  . Warfarin anticoagulation   . Wide-complex tachycardia ([place])     Past Surgical History:  Procedure Laterality Date  . ABDOMINAL AORTIC ANEURYSM REPAIR  11/2002  . ANTERIOR CERVICAL DECOMP/DISCECTOMY FUSION  1995  . BACK SURGERY    . BI-VENTRICULAR PACEMAKER INSERTION (CRT-P)  02-11-2013   Pt with previously implanted MDT CRTD downgraded to CRTP by Dr Lovena Le 02-11-13  . CARDIAC CATHETERIZATION  "several"  . CARDIAC CATHETERIZATION N/A 03/18/2016   Procedure: RIGHT/LEFT HEART CATH AND CORONARY/GRAFT ANGIOGRAPHY;  Surgeon: Nelva Bush, MD;  Location: [facility];  Service: Cardiovascular;  Laterality: N/A;  . CATARACT EXTRACTION W/ INTRAOCULAR LENS  IMPLANT, BILATERAL    . COLONOSCOPY W/ POLYPECTOMY    . CORONARY ANGIOPLASTY WITH STENT PLACEMENT  2004  . CORONARY ARTERY BYPASS GRAFT  2004   CABG X4  . FRACTURE SURGERY    . IMPLANTABLE CARDIOVERTER DEFIBRILLATOR (ICD) GENERATOR CHANGE N/A 02/11/2013   Procedure: ICD GENERATOR CHANGE;  Surgeon: Evans Lance, MD;  Location: [facility] CATH LAB;  Service: Cardiovascular;  Laterality: N/A;  . INCISION AND DRAINAGE ABSCESS / HEMATOMA OF BURSA / KNEE / THIGH    . INSERT / REPLACE / REMOVE PACEMAKER  2009   original pacer/defibrillator; Dr. Lovena Le 2009... by the pacing  . LUMBAR FUSION  2010  . ORIF TIBIA & FIBULA FRACTURES Right 2000s  . PILONIDAL CYST EXCISION    . SPINAL CORD STIMULATOR IMPLANT  12/2011  . SURGERY SCROTAL / TESTICULAR       Current Outpatient  Prescriptions  Medication Sig Dispense Refill  . ACCU-CHEK AVIVA PLUS test strip USE ONE STRIP TO CHECK GLUCOSE THREE TIMES DAILY AS DIRECTED 100 each 3  . ACCU-CHEK FASTCLIX LANCETS MISC USE ONE  TO CHECK GLUCOSE THREE TIMES DAILY 102 each 5  . acetaminophen (TYLENOL) 325 MG tablet Take 325 mg by mouth every 4 (four) hours as needed for moderate pain.     . Albuterol Sulfate (PROAIR RESPICLICK) 174 (90 Base) MCG/ACT AEPB Inhale 2 puffs into the lungs every 6 (six) hours as needed. For wheezing or shortness of breath. 1 each 0  . allopurinol (ZYLOPRIM) 100 MG tablet TAKE  TWO TABLETS BY MOUTH ONCE DAILY 180 tablet 3  . amLODipine (NORVASC) 5 MG tablet Take 2.5 mg by mouth daily.    Marland Kitchen aspirin 81 MG tablet Take 81 mg by mouth once a week. Take on sundays    . atorvastatin (LIPITOR) 10 MG tablet Take 1 tablet (10 mg total) by mouth daily. 90 tablet 3  . benzonatate (TESSALON) 100 MG capsule Take 100 mg by mouth 3 (three) times daily as needed for cough.    . carboxymethylcellulose (REFRESH PLUS) 0.5 % SOLN Place 2 drops into both eyes daily.     . carvedilol (COREG) 6.25 MG tablet Take 1 tablet (6.25 mg total) by mouth 2 (two) times daily. 180 tablet 3  . Cholecalciferol 1000 units tablet Take 1,000 Units by mouth daily.    . colchicine 0.6 MG tablet Take 0.6 mg by mouth daily as needed (gout).     . furosemide (LASIX) 80 MG tablet Take 80 mg by mouth twice daily. May take additional 40 mg daily as needed for weight gain of 2 lbs or more in 24 hour period. 180 tablet 3  . gabapentin (NEURONTIN) 300 MG capsule TAKE ONE CAPSULE BY MOUTH 4 TIMES DAILY AS NEEDED FOR LEG PAIN 360 capsule 3  . guaiFENesin-codeine (VIRTUSSIN A/C) 100-10 MG/5ML syrup Take 5 mLs by mouth 3 (three) times daily as needed for cough. 120 mL 0  . insulin detemir (LEVEMIR) 100 UNIT/ML injection Inject 10 Units into the skin 2 (two) times daily.     . insulin regular (NOVOLIN R) 100 units/mL injection Inject 20 Units into the skin 2  (two) times daily before a meal.     . ipratropium (ATROVENT) 0.06 % nasal spray Place 2 sprays into both nostrils daily.     Marland Kitchen losartan (COZAAR) 50 MG tablet TAKE ONE TABLET BY MOUTH ONCE DAILY 90 tablet 3  . methocarbamol (ROBAXIN) 750 MG tablet Take 750 mg by mouth 2 (two) times daily as needed for muscle spasms.    . nitroGLYCERIN (NITROSTAT) 0.4 MG SL tablet Place 1 tablet (0.4 mg total) under the tongue every 5 (five) minutes as needed for chest pain (MAX 3 TABLETS). 25 tablet 3  . omeprazole (PRILOSEC) 20 MG capsule Take 20 mg by mouth 2 (two) times daily before a meal.    . potassium chloride SA (K-DUR,KLOR-CON) 20 MEQ tablet Take 30 mEq by mouth 2 (two) times daily.    . predniSONE (DELTASONE) 10 MG tablet Take 4 tabs po qd x 3 days, then 3 tabs po qd x 3 days, then 2 tabs po qd x 3 days, then 1 tab po qd x 3 days 30 tablet 0  . traMADol (ULTRAM) 50 MG tablet Take 100 mg by mouth every 8 (eight) hours as needed (pain).    Marland Kitchen warfarin (COUMADIN) 2.5 MG tablet TAKE ONE TABLET BY MOUTH AS DIRECTED (Patient taking differently: takes 1 tablet daily except on wednesdday he takes 2) 120 tablet 1   No current facility-administered medications for this visit.     Allergies:   Avelox [moxifloxacin hcl in nacl] and Penicillins    Social History:  The patient  reports that he quit smoking about 21 years ago. His smoking use included Cigarettes. He started smoking about 60 years ago. He has a 117.00 pack-year smoking history. He has never used smokeless tobacco. He reports that he drinks about 1.2 oz of alcohol per week . He reports that he does not use drugs.  Family History:  The patient's family history includes Coronary artery disease in his father; Diabetes in his brother and father; Hypertension in his mother; Other in his father; Stroke in his mother.    ROS:  Please see the history of present illness.   Otherwise, review of systems are positive for leg swelling.   All other systems are  reviewed and negative.    PHYSICAL EXAM: VS:  BP 122/80   Pulse 63   Ht 6' (1.829 m)   Wt 232 lb (105.2 kg)   SpO2 97%   BMI 31.46 kg/m  , BMI Body mass index is 31.46 kg/m. GEN: Well nourished, well developed, in no acute distress  HEENT: normal  Neck: no JVD, carotid bruits, or masses Cardiac: irregularly irregular; no murmurs, rubs, or gallops, chronic right leg swelling edema  Respiratory:  clear to auscultation bilaterally, normal work of breathing GI: soft, nontender, nondistended, + BS MS: no deformity or atrophy  Skin: warm and dry, no rash Neuro:  Strength and sensation are intact Psych: euthymic mood, full affect   EKG:   The ekg ordered today demonstrates atrial fibrillation, rate controlled   Recent Labs: 03/15/2016: B Natriuretic Peptide 610.8; Magnesium 2.1 03/23/2016: Hemoglobin 12.7 03/30/2016: ALT 11; Platelets 211 04/11/2016: BUN 26; Creatinine, Ser 1.32; NT-Pro BNP 2,369; Potassium 4.0; Sodium 140   Lipid Panel    Component Value Date/Time   CHOL 124 03/16/2016 0102   TRIG 95 03/16/2016 0102   HDL 41 03/16/2016 0102   CHOLHDL 3.0 03/16/2016 0102   VLDL 19 03/16/2016 0102   LDLCALC 64 03/16/2016 0102     Other studies Reviewed: Additional studies/ records that were reviewed today with results demonstrating: daily weight; > 24 about 30-40% of the time.   ASSESSMENT AND PLAN:  1. AFib: rate controlled. Pacer for bradycardia. Coumadin for stroke prevention.  He follows with Dr. Lovena Le. 2. CAD: s/p CABG. He had distinct sx with his prior MI. No need to FFR left main at this time.   3. Chronic systolic heart failure: Appears euvolemic. Continue daily weights and management of volume status. Changed threshold for taking extra diuretic for weight > 255 at last visit.  He has lost weight since then.  He will call us if his weight increases or he feels signs of fluid overload. 4. DM: Followed by PMD.  5. Polycythemia vera: Treated with  phlebotomy. 6. I encouraged him in the past to try to get access to a recumbent bike to help increase his stamina and ability to exercise.  He has chronic back pain that limits his exercise.  He is recovering from his cough.  7. THoracic aneurysm: Will schedule repeat CT scan to look at aorta in one year.  46 mm in 2015.  49 mm by CT scan in 2018.    Current medicines are reviewed at length with the patient today.  The patient concerns regarding his medicines were addressed.  The following changes have been made:  No change  Labs/ tests ordered today include: BMet No orders of the defined types were placed in this encounter.   Recommend 150 minutes/week of aerobic exercise Low fat, low carb, high fiber diet recommended  Disposition:   FU in 4 months- he would like to be seen every 4 months.  He sees EP in 8 months.   Signed, Larae Grooms, MD  05/31/2016 2:15 PM    [facility] [address], [place], [place]  [address] Phone: [phone])  [phone]; Fax: [phone]    

## 2016-05-31 NOTE — Patient Instructions (Signed)
Medication Instructions:  Your physician recommends that you continue on your current medications as directed. Please refer to the Current Medication list given to you today.   Labwork: None ordered  Testing/Procedures: None ordered  Follow-Up: Your physician wants you to follow-up in: 4 months with Dr. Irish Lack. You will receive a reminder letter in the mail two months in advance. If you don't receive a letter, please call our office to schedule the follow-up appointment.   Any Other Special Instructions Will Be Listed Below (If Applicable).     If you need a refill on your cardiac medications before your next appointment, please call your pharmacy.

## 2016-06-01 NOTE — Telephone Encounter (Signed)
Call them and see how they are both feeling at this point.

## 2016-06-02 NOTE — Telephone Encounter (Signed)
LVM for pt or pts wife to call back.

## 2016-06-03 ENCOUNTER — Ambulatory Visit: Payer: Medicare Other | Admitting: Internal Medicine

## 2016-06-03 DIAGNOSIS — L97512 Non-pressure chronic ulcer of other part of right foot with fat layer exposed: Secondary | ICD-10-CM | POA: Diagnosis not present

## 2016-06-03 NOTE — Telephone Encounter (Signed)
Ok to refill cough syrup Monday if needed

## 2016-06-03 NOTE — Telephone Encounter (Signed)
Spoke with pt's wife. States he is feeling better and still has 3 days of prednisone. He does not have any more cough syrup. Is a refill necessary?

## 2016-06-07 ENCOUNTER — Telehealth: Payer: Self-pay | Admitting: Pharmacist

## 2016-06-07 ENCOUNTER — Other Ambulatory Visit: Payer: Self-pay | Admitting: *Deleted

## 2016-06-07 ENCOUNTER — Ambulatory Visit (INDEPENDENT_AMBULATORY_CARE_PROVIDER_SITE_OTHER): Payer: Medicare Other

## 2016-06-07 DIAGNOSIS — I482 Chronic atrial fibrillation: Secondary | ICD-10-CM | POA: Diagnosis not present

## 2016-06-07 DIAGNOSIS — D45 Polycythemia vera: Secondary | ICD-10-CM

## 2016-06-07 DIAGNOSIS — I4821 Permanent atrial fibrillation: Secondary | ICD-10-CM

## 2016-06-07 LAB — POCT INR: INR: 2.5

## 2016-06-07 NOTE — Telephone Encounter (Signed)
I am ok with not bridging since he has not done this in the past.

## 2016-06-07 NOTE — Telephone Encounter (Signed)
Pt seen today for Coumadin check and stated he was planning to hold his Coumadin for 5 days prior to a radiofrequency ablation with Dr Maryjean Ka at [facility]. Pt previously held Coumadin for 3 days for a procedure in January.  Pt's CHADS2 score is 4 (DM, HTN, CHF, and age) which would not warrant Lovenox bridging, however pt also has a hx of a DVT and an LA thrombus. Pt has never been bridged with Lovenox before. Will route to Dr Irish Lack for his thoughts on whether or not bridging will be required for upcoming procedure.

## 2016-06-08 ENCOUNTER — Ambulatory Visit (HOSPITAL_BASED_OUTPATIENT_CLINIC_OR_DEPARTMENT_OTHER): Payer: Medicare Other | Admitting: Hematology & Oncology

## 2016-06-08 ENCOUNTER — Encounter: Payer: Self-pay | Admitting: Hematology & Oncology

## 2016-06-08 ENCOUNTER — Ambulatory Visit (HOSPITAL_BASED_OUTPATIENT_CLINIC_OR_DEPARTMENT_OTHER): Payer: Medicare Other

## 2016-06-08 ENCOUNTER — Other Ambulatory Visit (HOSPITAL_BASED_OUTPATIENT_CLINIC_OR_DEPARTMENT_OTHER): Payer: Medicare Other

## 2016-06-08 VITALS — BP 115/54 | HR 54 | Resp 20 | Wt 232.8 lb

## 2016-06-08 DIAGNOSIS — L97511 Non-pressure chronic ulcer of other part of right foot limited to breakdown of skin: Secondary | ICD-10-CM | POA: Diagnosis not present

## 2016-06-08 DIAGNOSIS — M2041 Other hammer toe(s) (acquired), right foot: Secondary | ICD-10-CM | POA: Diagnosis not present

## 2016-06-08 DIAGNOSIS — D45 Polycythemia vera: Secondary | ICD-10-CM

## 2016-06-08 DIAGNOSIS — I4891 Unspecified atrial fibrillation: Secondary | ICD-10-CM

## 2016-06-08 DIAGNOSIS — IMO0002 Reserved for concepts with insufficient information to code with codable children: Secondary | ICD-10-CM

## 2016-06-08 DIAGNOSIS — E119 Type 2 diabetes mellitus without complications: Secondary | ICD-10-CM

## 2016-06-08 DIAGNOSIS — E1151 Type 2 diabetes mellitus with diabetic peripheral angiopathy without gangrene: Secondary | ICD-10-CM

## 2016-06-08 DIAGNOSIS — E1165 Type 2 diabetes mellitus with hyperglycemia: Secondary | ICD-10-CM

## 2016-06-08 LAB — CBC WITH DIFFERENTIAL (CANCER CENTER ONLY)
BASO#: 0 10*3/uL (ref 0.0–0.2)
BASO%: 0.1 % (ref 0.0–2.0)
EOS%: 0 % (ref 0.0–7.0)
Eosinophils Absolute: 0 10*3/uL (ref 0.0–0.5)
HEMATOCRIT: 47.5 % (ref 38.7–49.9)
HGB: 15.6 g/dL (ref 13.0–17.1)
LYMPH#: 1 10*3/uL (ref 0.9–3.3)
LYMPH%: 9.4 % — ABNORMAL LOW (ref 14.0–48.0)
MCH: 27.4 pg — AB (ref 28.0–33.4)
MCHC: 32.8 g/dL (ref 32.0–35.9)
MCV: 83 fL (ref 82–98)
MONO#: 0.6 10*3/uL (ref 0.1–0.9)
MONO%: 6.2 % (ref 0.0–13.0)
NEUT#: 8.7 10*3/uL — ABNORMAL HIGH (ref 1.5–6.5)
NEUT%: 84.3 % — AB (ref 40.0–80.0)
Platelets: 128 10*3/uL — ABNORMAL LOW (ref 145–400)
RBC: 5.7 10*6/uL (ref 4.20–5.70)
RDW: 19.5 % — AB (ref 11.1–15.7)
WBC: 10.3 10*3/uL — ABNORMAL HIGH (ref 4.0–10.0)

## 2016-06-08 NOTE — Progress Notes (Signed)
Hematology and Oncology Follow Up Visit  [name] [id number] 02/11/1941 76 y.o. 06/08/2016   Principle Diagnosis:   Polycythemia vera  Current Therapy:    Phlebotomy to maintain hematocrit below 45%.       Aspirin 81 mg by mouth weekly       Coumadin for chronic atrial fibrillation      Interim History:  [name] is back for follow-up.we saw him 3 months ago. Since we last saw him, he has been hospitalized. He generally goes in because of heart failure.  He has had proximal with his right foot. He has a brace on his right foot. His wife showed me a picture of his toes. He has an ulcer with one of his toes.  He does have diabetes. He is trying to monitor his blood sugars. Thank you, he's lost quite a bit of weight so this will help with his diabetic control.   Blastoma check his iron studies back in October, his ferritin was 40 with an iron saturation of 20%.   He is on Coumadin. He has this monitored relatively frequently.  Overall, his performance status is ECOG 1-2.   Medications:  Current Outpatient Prescriptions:  .  ACCU-CHEK AVIVA PLUS test strip, USE ONE STRIP TO CHECK GLUCOSE THREE TIMES DAILY AS DIRECTED, Disp: 100 each, Rfl: 3 .  ACCU-CHEK FASTCLIX LANCETS MISC, USE ONE  TO CHECK GLUCOSE THREE TIMES DAILY, Disp: 102 each, Rfl: 5 .  acetaminophen (TYLENOL) 325 MG tablet, Take 325 mg by mouth every 4 (four) hours as needed for moderate pain. , Disp: , Rfl:  .  Albuterol Sulfate (PROAIR RESPICLICK) 536 (90 Base) MCG/ACT AEPB, Inhale 2 puffs into the lungs every 6 (six) hours as needed. For wheezing or shortness of breath., Disp: 1 each, Rfl: 0 .  allopurinol (ZYLOPRIM) 100 MG tablet, TAKE TWO TABLETS BY MOUTH ONCE DAILY, Disp: 180 tablet, Rfl: 3 .  amLODipine (NORVASC) 5 MG tablet, Take 2.5 mg by mouth daily., Disp: , Rfl:  .  aspirin 81 MG tablet, Take 81 mg by mouth once a week. Take on sundays, Disp: , Rfl:  .  atorvastatin (LIPITOR) 10 MG tablet, Take 1  tablet (10 mg total) by mouth daily., Disp: 90 tablet, Rfl: 3 .  carboxymethylcellulose (REFRESH PLUS) 0.5 % SOLN, Place 2 drops into both eyes daily. , Disp: , Rfl:  .  carvedilol (COREG) 6.25 MG tablet, Take 1 tablet (6.25 mg total) by mouth 2 (two) times daily., Disp: 180 tablet, Rfl: 3 .  Cholecalciferol 1000 units tablet, Take 1,000 Units by mouth daily., Disp: , Rfl:  .  colchicine 0.6 MG tablet, Take 0.6 mg by mouth daily as needed (gout). , Disp: , Rfl:  .  furosemide (LASIX) 80 MG tablet, Take 80 mg by mouth twice daily. May take additional 40 mg daily as needed for weight gain of 2 lbs or more in 24 hour period., Disp: 180 tablet, Rfl: 3 .  gabapentin (NEURONTIN) 300 MG capsule, TAKE ONE CAPSULE BY MOUTH 4 TIMES DAILY AS NEEDED FOR LEG PAIN, Disp: 360 capsule, Rfl: 3 .  insulin detemir (LEVEMIR) 100 UNIT/ML injection, Inject 10 Units into the skin 2 (two) times daily. , Disp: , Rfl:  .  insulin regular (NOVOLIN R) 100 units/mL injection, Inject 20 Units into the skin 2 (two) times daily before a meal. , Disp: , Rfl:  .  ipratropium (ATROVENT) 0.06 % nasal spray, Place 2 sprays into both nostrils daily. ,  Disp: , Rfl:  .  losartan (COZAAR) 50 MG tablet, TAKE ONE TABLET BY MOUTH ONCE DAILY, Disp: 90 tablet, Rfl: 3 .  methocarbamol (ROBAXIN) 750 MG tablet, Take 750 mg by mouth 2 (two) times daily as needed for muscle spasms., Disp: , Rfl:  .  nitroGLYCERIN (NITROSTAT) 0.4 MG SL tablet, Place 1 tablet (0.4 mg total) under the tongue every 5 (five) minutes as needed for chest pain (MAX 3 TABLETS)., Disp: 25 tablet, Rfl: 3 .  omeprazole (PRILOSEC) 20 MG capsule, Take 20 mg by mouth 2 (two) times daily before a meal., Disp: , Rfl:  .  potassium chloride SA (K-DUR,KLOR-CON) 20 MEQ tablet, Take 30 mEq by mouth 2 (two) times daily., Disp: , Rfl:  .  traMADol (ULTRAM) 50 MG tablet, Take 100 mg by mouth every 8 (eight) hours as needed (pain)., Disp: , Rfl:  .  warfarin (COUMADIN) 2.5 MG tablet, TAKE  ONE TABLET BY MOUTH AS DIRECTED (Patient taking differently: takes 1 tablet daily except on wednesdday he takes 2), Disp: 120 tablet, Rfl: 1  Allergies:  Allergies  Allergen Reactions  . Avelox [Moxifloxacin Hcl In Nacl] Swelling, Rash and Other (See Comments)    Patient became hypotensive after infusion started Because of a history of documented adverse serious drug reaction;Medi Alert bracelet  is recommended  . Penicillins Anaphylaxis, Other (See Comments) and Swelling    REACTION: anaphylaxis Because of a history of documented adverse serious drug reaction;Medi Alert bracelet  is recommended    Past Medical History, Surgical history, Social history, and Family History were reviewed and updated.  Review of Systems: As above  Physical Exam:  weight is 232 lb 12.8 oz (105.6 kg). His blood pressure is 115/54 (abnormal) and his pulse is 54 (abnormal). His respiration is 20 and oxygen saturation is 85% (abnormal).   Well-developed and well-nourished white male. He is moderately obese. Head and neck exam shows no ocular or oral lesions. There is less conjunctival inflammation. There is no adenopathy in his neck. Lungs are clear. He has no rales, wheezes or rhonchi. Cardiac exam irregular rate and rhythm consistent with atrial fibrillation, he has  no murmurs, rubs or bruits. Abdomen is soft. He has good bowel sounds. He is somewhat obese. There is no palpable liver or spleen tip. Back exam no tenderness over the spine, ribs or hips. Extremities shows some chronic mild nonpitting edema of the legs. He does have a brace on the right foot. Skin exam no rashes. He still has a little bit of a ruddy complexion. Neurological exam is nonfocal.  Lab Results  Component Value Date   WBC 10.3 (H) 06/08/2016   HGB 15.6 06/08/2016   HCT 47.5 06/08/2016   MCV 83 06/08/2016   PLT 128 (L) 06/08/2016     Chemistry      Component Value Date/Time   NA 140 04/11/2016 1037   NA 141 03/09/2016 1320   K 4.0  04/11/2016 1037   K 4.5 03/09/2016 1320   CL 103 04/11/2016 1037   CL 100 12/30/2015 1327   CL 100 04/22/2014 1350   CO2 20 04/11/2016 1037   CO2 25 03/09/2016 1320   BUN 26 04/11/2016 1037   BUN 28.8 (H) 03/09/2016 1320   CREATININE 1.32 (H) 04/11/2016 1037   CREATININE 1.9 (H) 03/09/2016 1320      Component Value Date/Time   CALCIUM 8.9 04/11/2016 1037   CALCIUM 9.2 03/09/2016 1320   ALKPHOS 89 03/30/2016 1243   ALKPHOS 97  03/09/2016 1320   AST 12 03/30/2016 1243   AST 13 03/09/2016 1320   ALT 11 03/30/2016 1243   ALT 10 03/09/2016 1320   BILITOT 0.9 03/30/2016 1243   BILITOT 0.88 03/09/2016 1320         Impression and Plan: [name] is 76 year old male with polycythemia.   We will go ahead and phlebotomize him. I want to make sure we keep his hematocrit below 45%.   Overall, I worry most about his right foot. I worry about that toe. Hopefully, he will not need to have this amputated.  I will plan to see him back in 2 months. We have to keep a close watch on him and his blood counts.    Volanda Napoleon, MD 3/21/20182:45 PM

## 2016-06-08 NOTE — Patient Instructions (Signed)
Polycythemia Vera Polycythemia vera (PV), or myeloproliferative disease, is a form of blood cancer in which the bone marrow makes too many (overproduces) red blood cells. The bone marrow may also make too many clotting cells (platelets) and white blood cells. Bone marrow is the spongy center of bones where blood cells are produced. Sometimes, there may be an overproduction of blood cells in the liver and spleen, causing those organs to become enlarged. Additionally, people who have PV are at a higher risk for stroke or heart attack because their blood may clot more easily. PV is a long-term disease. What are the causes? Almost all people who have PV have an abnormal gene (genetic mutation) that causes changes in the way that the bone marrow makes blood cells. This gene, which is called JAK2, is not passed along from parent to child (is not hereditary). It is not known what triggers the genetic mutation that causes the body to produce too many red blood cells. What increases the risk? This condition is more likely to develop in:  Males.  People who are 33 years of age or older. What are the signs or symptoms? You may not have any symptoms in the early stage of PV. When symptoms develop, they may include:  Shortness of breath.  Dizziness.  Hot and flushed skin.  Itchy skin.  Sweats, especially night sweats.  Headache.  Tiredness.  Ringing in the ears.  Blurred vision or blind spots.  Bone pain.  Weight loss.  Fever.  Blood-tinged vomit or bowel movements. How is this diagnosed? This condition may be diagnosed during a routine physical exam if you have a blood test called a complete blood count (CBC). Your health care provider also may suspect PV if you have symptoms. During the physical exam, your provider may find that you have an enlarged liver or spleen. You may also have tests to confirm the diagnosis. These may include:  A procedure to remove a sample of bone marrow for  testing (bone marrow biopsy).  Blood tests to check for:  The JAK2 gene.  Low levels of a hormone that helps to regulate blood production (erythropoietin). How is this treated? There is no cure for PV, but treatment can help to control the disease. There are several types of treatment. No single treatment works for everyone. You will need to work with a blood cancer specialist (hematologist) to find the treatment that is best for you. Options include:  Periodically having some blood removed with a needle (drawn) to lower the number of red blood cells (phlebotomy).  Medicine. Your health care provider may recommend:  Low-dose aspirin to lower your risk for blood clots.  A medicine to reduce red blood cell production (hydroxyurea).  A medicine to lower the number of red blood cells (interferon).  A medicine that slows down the effects of JAK2 (ruxolitinib). Follow these instructions at home:  Take over-the-counter and prescription medicines only as told by your health care provider.  Return to your normal activities as told by your health care provider. Ask your health care provider what activities are safe for you.  Do not use tobacco products, including cigarettes, chewing tobacco, or e-cigarettes. If you need help quitting, ask your health care provider.  Keep all follow-up visits as told by your health care provider. This is important. Contact a health care provider if:  You have side effects from your medicines.  Your symptoms change or get worse at home.  You have blood in your stool  or you vomit blood. Get help right away if:  You have sudden and severe pain in your abdomen.  You have chest pain or difficulty breathing.  You have signs of stroke, such as:  Sudden numbness.  Weakness of your face or arm.  Confusion.  Difficulty speaking or understanding speech. These symptoms may represent a serious problem that is an emergency. Do not wait to see if the  symptoms will go away. Get medical help right away. Call your local emergency services (911 in the [place] Do not drive yourself to the hospital. This information is not intended to replace advice given to you by your health care provider. Make sure you discuss any questions you have with your health care provider. Document Released: 11/30/2000 Document Revised: 08/13/2015 Document Reviewed: 09/17/2014 Elsevier Interactive Patient Education  2017 [employer].

## 2016-06-08 NOTE — Telephone Encounter (Signed)
Pt aware to hold Coumadin x5 days prior to procedure with no bridge.

## 2016-06-08 NOTE — Progress Notes (Signed)
Attempted phlebotomies x 3 . Approximately 300 mls obtained. Patient tolerated well.

## 2016-06-09 LAB — IRON AND TIBC
%SAT: 27 % (ref 20–55)
Iron: 81 ug/dL (ref 42–163)
TIBC: 304 ug/dL (ref 202–409)
UIBC: 223 ug/dL (ref 117–376)

## 2016-06-09 LAB — FERRITIN: FERRITIN: 66 ng/mL (ref 22–316)

## 2016-06-10 ENCOUNTER — Ambulatory Visit (INDEPENDENT_AMBULATORY_CARE_PROVIDER_SITE_OTHER): Payer: Medicare Other | Admitting: Gastroenterology

## 2016-06-10 ENCOUNTER — Ambulatory Visit: Payer: Self-pay | Admitting: Internal Medicine

## 2016-06-10 ENCOUNTER — Encounter: Payer: Self-pay | Admitting: Gastroenterology

## 2016-06-10 VITALS — BP 108/60 | HR 78 | Resp 20 | Ht 72.0 in | Wt 233.0 lb

## 2016-06-10 DIAGNOSIS — Z86010 Personal history of colonic polyps: Secondary | ICD-10-CM | POA: Diagnosis not present

## 2016-06-10 NOTE — Progress Notes (Signed)
Review of pertinent gastrointestinal problems: 1. Adenomatous colon polyps. Dr. Olevia Perches colonsocopy 2011 2 small TAs. Colonoscopy Dr. Ardis Hughs 09/2014 Six polyps were found, removed and sent to pathology; The largest was heaped up, soft, 3cm across, located in proximal transverse segment, removed in piecemeal fashion.  Path showed the largest was TVA (no HGD) and the rest were TAs.  Repeat colonsocopy Dr. Ardis Hughs 04/2015: four polyps, one was 1.4cm piecemeal removed TA.  HPI: This is a  very pleasant 76 year old man who is here with his wife today. I last saw him about a year ago the time of a colonoscopy. See those results summarized above  Chief complaint is personal history of precancerous colon polyps  Since his last colonoscopy he has had further mild deterioration of his heart disease. He has chronic systolic heart failure. He was found to have a thoracic aortic aneurysm at 4.9 cm. He has implanted defibrillator he is still on Coumadin. He gets quite winded after just walking up a flight of stairs.   He is having no GI problems, symptoms or concerns.    EF is 35-45% Heart Cath 02/2016 Recent CT thoracic aneurism 4.9cm     ROS: complete GI ROS as described in HPI.  Constitutional:  No unintentional weight loss   Past Medical History:  Diagnosis Date  . AAA (abdominal aortic aneurysm) ([place])    Surgical repair 11/2002.  . Adenomatous colon polyp   . AICD (automatic cardioverter/defibrillator) present   . Alcohol ingestion of more than four drinks per week    Excess beer  not a dependency problem  . Aortic valve sclerosis   . Arthritis   . Atrial fibrillation ([place])    Previous long-term amiodarone therapy with multiple cardioversions / amiodarone stopped September, 2009  . Atrial flutter [facility])    Started November, 2010, Left-sided and cannot ablate  . Bony abnormality    Patient's manubrium is slightly displaced to the right  . CAD (coronary artery disease)    a. s/p CABG 2004.   . Cardiac resynchronization therapy defibrillator (CRT-D) in place   . Carotid artery disease ([place])    Doppler, December, 2013, 0-39% bilateral  . Chronic systolic CHF (congestive heart failure) ([place])   . Chronotropic incompetence    IV pacing rate adjusted  . CKD (chronic kidney disease), stage III   . COPD (chronic obstructive pulmonary disease) ([place])   . Dilated aortic root ([place])   . Discolored skin   . Diverticulosis   . Drug therapy    Redness and swelling with Avelox infusion May 24, 2011  . Eye abnormality    Ophthalmologist questions a clot in one of his eyes, May, 2012  . Gout   . Hyperlipidemia   . Hypertension   . Internal hemorrhoids   . Ischemic cardiomyopathy   . Left atrial thrombus    Remote past... cardioversions done since that time  . Mitral regurgitation    Mild echo  . Myocardial infarction   . Nasal drainage    Chronic  . Overweight(278.02)   . Pericardial effusion   . Pleural effusion    Large loculated effusion on the left side November, 2011. This was tapped. It was exudative. Cytology revealed no cancer no proof of mesothelioma area pulmonary team felt that no further workup was needed  . Pleural thickening   . Pneumonia   . Polycythemia vera ([place]) 07/28/2014  . SOB (shortness of breath)    Large left effusion/ thoracentesis/hospitalization/November, 2011... exudated.. cytology negative.. Dr.Wert.Marland Kitchen  no proof of mesothelioma  . Spinal stenosis    Surgery Dr.Elsner  . Thrombophlebitis of superficial veins of upper extremities    Possible venous stenosis from defibrillator  . Type II diabetes mellitus ([place])   . Venous insufficiency    Toe discoloration chronic  . Ventral hernia    April, 2014, result of his abdominal surgery  . Warfarin anticoagulation   . Wide-complex tachycardia ([place])     Past Surgical History:  Procedure Laterality Date  . ABDOMINAL AORTIC ANEURYSM REPAIR  11/2002  . ANTERIOR CERVICAL DECOMP/DISCECTOMY FUSION  1995  . BACK  SURGERY    . BI-VENTRICULAR PACEMAKER INSERTION (CRT-P)  02-11-2013   Pt with previously implanted MDT CRTD downgraded to CRTP by Dr Lovena Le 02-11-13  . CARDIAC CATHETERIZATION  "several"  . CARDIAC CATHETERIZATION N/A 03/18/2016   Procedure: RIGHT/LEFT HEART CATH AND CORONARY/GRAFT ANGIOGRAPHY;  Surgeon: Nelva Bush, MD;  Location: [facility];  Service: Cardiovascular;  Laterality: N/A;  . CATARACT EXTRACTION W/ INTRAOCULAR LENS  IMPLANT, BILATERAL    . COLONOSCOPY W/ POLYPECTOMY    . CORONARY ANGIOPLASTY WITH STENT PLACEMENT  2004  . CORONARY ARTERY BYPASS GRAFT  2004   CABG X4  . FRACTURE SURGERY    . IMPLANTABLE CARDIOVERTER DEFIBRILLATOR (ICD) GENERATOR CHANGE N/A 02/11/2013   Procedure: ICD GENERATOR CHANGE;  Surgeon: Evans Lance, MD;  Location: [facility] CATH LAB;  Service: Cardiovascular;  Laterality: N/A;  . INCISION AND DRAINAGE ABSCESS / HEMATOMA OF BURSA / KNEE / THIGH    . INSERT / REPLACE / REMOVE PACEMAKER  2009   original pacer/defibrillator; Dr. Lovena Le 2009... by the pacing  . LUMBAR FUSION  2010  . ORIF TIBIA & FIBULA FRACTURES Right 2000s  . PILONIDAL CYST EXCISION    . SPINAL CORD STIMULATOR IMPLANT  12/2011  . SURGERY SCROTAL / TESTICULAR      Current Outpatient Prescriptions  Medication Sig Dispense Refill  . ACCU-CHEK AVIVA PLUS test strip USE ONE STRIP TO CHECK GLUCOSE THREE TIMES DAILY AS DIRECTED 100 each 3  . ACCU-CHEK FASTCLIX LANCETS MISC USE ONE  TO CHECK GLUCOSE THREE TIMES DAILY 102 each 5  . acetaminophen (TYLENOL) 325 MG tablet Take 325 mg by mouth every 4 (four) hours as needed for moderate pain.     . Albuterol Sulfate (PROAIR RESPICLICK) 355 (90 Base) MCG/ACT AEPB Inhale 2 puffs into the lungs every 6 (six) hours as needed. For wheezing or shortness of breath. 1 each 0  . allopurinol (ZYLOPRIM) 100 MG tablet TAKE TWO TABLETS BY MOUTH ONCE DAILY 180 tablet 3  . amLODipine (NORVASC) 5 MG tablet Take 2.5 mg by mouth daily.    Marland Kitchen aspirin 81 MG  tablet Take 81 mg by mouth once a week. Take on sundays    . atorvastatin (LIPITOR) 10 MG tablet Take 1 tablet (10 mg total) by mouth daily. 90 tablet 3  . carboxymethylcellulose (REFRESH PLUS) 0.5 % SOLN Place 2 drops into both eyes daily.     . carvedilol (COREG) 6.25 MG tablet Take 1 tablet (6.25 mg total) by mouth 2 (two) times daily. 180 tablet 3  . Cholecalciferol 1000 units tablet Take 1,000 Units by mouth daily.    . colchicine 0.6 MG tablet Take 0.6 mg by mouth daily as needed (gout).     . furosemide (LASIX) 80 MG tablet Take 80 mg by mouth twice daily. May take additional 40 mg daily as needed for weight gain of 2 lbs or more in  24 hour period. 180 tablet 3  . gabapentin (NEURONTIN) 300 MG capsule TAKE ONE CAPSULE BY MOUTH 4 TIMES DAILY AS NEEDED FOR LEG PAIN 360 capsule 3  . insulin detemir (LEVEMIR) 100 UNIT/ML injection Inject 10 Units into the skin 2 (two) times daily.     . insulin regular (NOVOLIN R) 100 units/mL injection Inject 20 Units into the skin 2 (two) times daily before a meal.     . ipratropium (ATROVENT) 0.06 % nasal spray Place 2 sprays into both nostrils daily.     Marland Kitchen losartan (COZAAR) 50 MG tablet TAKE ONE TABLET BY MOUTH ONCE DAILY 90 tablet 3  . methocarbamol (ROBAXIN) 750 MG tablet Take 750 mg by mouth 2 (two) times daily as needed for muscle spasms.    . nitroGLYCERIN (NITROSTAT) 0.4 MG SL tablet Place 1 tablet (0.4 mg total) under the tongue every 5 (five) minutes as needed for chest pain (MAX 3 TABLETS). 25 tablet 3  . omeprazole (PRILOSEC) 20 MG capsule Take 20 mg by mouth 2 (two) times daily before a meal.    . potassium chloride SA (K-DUR,KLOR-CON) 20 MEQ tablet Take 30 mEq by mouth 2 (two) times daily.    . traMADol (ULTRAM) 50 MG tablet Take 100 mg by mouth every 8 (eight) hours as needed (pain).    Marland Kitchen warfarin (COUMADIN) 2.5 MG tablet TAKE ONE TABLET BY MOUTH AS DIRECTED (Patient taking differently: takes 1 tablet daily except on wednesdday he takes 2) 120  tablet 1   No current facility-administered medications for this visit.     Allergies as of 06/10/2016 - Review Complete 06/10/2016  Allergen Reaction Noted  . Avelox [moxifloxacin hcl in nacl] Swelling, Rash, and Other (See Comments) 05/24/2011  . Penicillins Anaphylaxis, Other (See Comments), and Swelling 01/27/2010    Family History  Problem Relation Age of Onset  . Hypertension Mother   . Stroke Mother   . Diabetes Father   . Coronary artery disease Father   . Other Father     DVT  . Diabetes Brother   . Colon cancer Neg Hx   . Heart attack Neg Hx     Social History   Social History  . Marital status: Married    Spouse name: N/A  . Number of children: N/A  . Years of education: N/A   Occupational History  . Retired- Nurse, mental health    Social History Main Topics  . Smoking status: Former Smoker    Packs/day: 3.00    Years: 39.00    Types: Cigarettes    Start date: 05/24/1956    Quit date: 03/22/1995  . Smokeless tobacco: Never Used  . Alcohol use 1.2 oz/week    2 Cans of beer per week  . Drug use: No  . Sexual activity: No   Other Topics Concern  . Not on file   Social History Narrative  . No narrative on file     Physical Exam: BP 108/60   Pulse 78   Resp 20   Ht 6' (1.829 m)   Wt 233 lb (105.7 kg)   BMI 31.60 kg/m  Constitutional: generally well-appearing Psychiatric: alert and oriented x3 Abdomen: soft, nontender, nondistended, no obvious ascites, no peritoneal signs, normal bowel sounds No peripheral edema noted in lower extremities  Assessment and plan: 76 y.o. male with Personal history of adenomatous colon polyps he is 76 years old and has significant heart disease. He has chronic systolic heart failure, he has an implanted defibrillator. He  has a thoracic aortic aneurysm. He cannot walk up a flight of stairs without significant shortness of breath. I am not so sure that polyp surveillance is still a relevant clinical problems for  him and I recommended against any screening, surveillance testing for him at this time. He does understand that if he has any new GI symptoms I am happy to see him at any time. He and his wife are both very comfortable with this advice   Greater than 50% of this visit was spent in direct face-to-face counseling.  Total time of this visit was 67min.   Please see the "Patient Instructions" section for addition details about the plan.  Owens Loffler, MD [facility] 06/10/2016, 1:54 PM

## 2016-06-10 NOTE — Patient Instructions (Signed)
You do not need further polyp surveillance, colon cancer screening tests. Please call if you have any new Gi problems.

## 2016-06-14 ENCOUNTER — Encounter: Payer: Self-pay | Admitting: Internal Medicine

## 2016-06-22 ENCOUNTER — Other Ambulatory Visit: Payer: Self-pay | Admitting: *Deleted

## 2016-06-22 DIAGNOSIS — I739 Peripheral vascular disease, unspecified: Secondary | ICD-10-CM

## 2016-06-22 DIAGNOSIS — L97511 Non-pressure chronic ulcer of other part of right foot limited to breakdown of skin: Secondary | ICD-10-CM

## 2016-06-27 DIAGNOSIS — M961 Postlaminectomy syndrome, not elsewhere classified: Secondary | ICD-10-CM | POA: Diagnosis not present

## 2016-06-28 DIAGNOSIS — M47816 Spondylosis without myelopathy or radiculopathy, lumbar region: Secondary | ICD-10-CM | POA: Diagnosis not present

## 2016-06-29 DIAGNOSIS — M2041 Other hammer toe(s) (acquired), right foot: Secondary | ICD-10-CM | POA: Diagnosis not present

## 2016-06-29 DIAGNOSIS — L97512 Non-pressure chronic ulcer of other part of right foot with fat layer exposed: Secondary | ICD-10-CM | POA: Diagnosis not present

## 2016-07-02 ENCOUNTER — Inpatient Hospital Stay (HOSPITAL_COMMUNITY)
Admission: EM | Admit: 2016-07-02 | Discharge: 2016-07-06 | DRG: 291 | Disposition: A | Discharge status: Home / Self Care | Payer: Medicare Other | Attending: Cardiovascular Disease | Admitting: Cardiovascular Disease

## 2016-07-02 ENCOUNTER — Encounter (HOSPITAL_COMMUNITY): Payer: Self-pay | Admitting: Emergency Medicine

## 2016-07-02 ENCOUNTER — Emergency Department (HOSPITAL_COMMUNITY): Payer: Medicare Other

## 2016-07-02 DIAGNOSIS — I4891 Unspecified atrial fibrillation: Secondary | ICD-10-CM

## 2016-07-02 DIAGNOSIS — I472 Ventricular tachycardia: Secondary | ICD-10-CM | POA: Diagnosis not present

## 2016-07-02 DIAGNOSIS — I77810 Thoracic aortic ectasia: Secondary | ICD-10-CM | POA: Diagnosis present

## 2016-07-02 DIAGNOSIS — Z823 Family history of stroke: Secondary | ICD-10-CM

## 2016-07-02 DIAGNOSIS — I5023 Acute on chronic systolic (congestive) heart failure: Secondary | ICD-10-CM | POA: Diagnosis present

## 2016-07-02 DIAGNOSIS — I340 Nonrheumatic mitral (valve) insufficiency: Secondary | ICD-10-CM | POA: Diagnosis not present

## 2016-07-02 DIAGNOSIS — Z8249 Family history of ischemic heart disease and other diseases of the circulatory system: Secondary | ICD-10-CM

## 2016-07-02 DIAGNOSIS — R748 Abnormal levels of other serum enzymes: Secondary | ICD-10-CM

## 2016-07-02 DIAGNOSIS — Z87891 Personal history of nicotine dependence: Secondary | ICD-10-CM | POA: Diagnosis not present

## 2016-07-02 DIAGNOSIS — I5021 Acute systolic (congestive) heart failure: Secondary | ICD-10-CM | POA: Diagnosis present

## 2016-07-02 DIAGNOSIS — D45 Polycythemia vera: Secondary | ICD-10-CM | POA: Diagnosis not present

## 2016-07-02 DIAGNOSIS — I272 Pulmonary hypertension, unspecified: Secondary | ICD-10-CM | POA: Diagnosis not present

## 2016-07-02 DIAGNOSIS — I2510 Atherosclerotic heart disease of native coronary artery without angina pectoris: Secondary | ICD-10-CM | POA: Diagnosis present

## 2016-07-02 DIAGNOSIS — J449 Chronic obstructive pulmonary disease, unspecified: Secondary | ICD-10-CM | POA: Diagnosis present

## 2016-07-02 DIAGNOSIS — K439 Ventral hernia without obstruction or gangrene: Secondary | ICD-10-CM | POA: Diagnosis not present

## 2016-07-02 DIAGNOSIS — Z9689 Presence of other specified functional implants: Secondary | ICD-10-CM | POA: Diagnosis present

## 2016-07-02 DIAGNOSIS — Z7901 Long term (current) use of anticoagulants: Secondary | ICD-10-CM | POA: Diagnosis not present

## 2016-07-02 DIAGNOSIS — Z8672 Personal history of thrombophlebitis: Secondary | ICD-10-CM

## 2016-07-02 DIAGNOSIS — I255 Ischemic cardiomyopathy: Secondary | ICD-10-CM | POA: Diagnosis present

## 2016-07-02 DIAGNOSIS — E1122 Type 2 diabetes mellitus with diabetic chronic kidney disease: Secondary | ICD-10-CM | POA: Diagnosis not present

## 2016-07-02 DIAGNOSIS — N183 Chronic kidney disease, stage 3 (moderate): Secondary | ICD-10-CM | POA: Diagnosis not present

## 2016-07-02 DIAGNOSIS — Z955 Presence of coronary angioplasty implant and graft: Secondary | ICD-10-CM

## 2016-07-02 DIAGNOSIS — Z794 Long term (current) use of insulin: Secondary | ICD-10-CM

## 2016-07-02 DIAGNOSIS — I482 Chronic atrial fibrillation, unspecified: Secondary | ICD-10-CM

## 2016-07-02 DIAGNOSIS — Z95810 Presence of automatic (implantable) cardiac defibrillator: Secondary | ICD-10-CM | POA: Diagnosis not present

## 2016-07-02 DIAGNOSIS — R0602 Shortness of breath: Secondary | ICD-10-CM | POA: Diagnosis not present

## 2016-07-02 DIAGNOSIS — I130 Hypertensive heart and chronic kidney disease with heart failure and stage 1 through stage 4 chronic kidney disease, or unspecified chronic kidney disease: Secondary | ICD-10-CM | POA: Diagnosis not present

## 2016-07-02 DIAGNOSIS — M109 Gout, unspecified: Secondary | ICD-10-CM | POA: Diagnosis present

## 2016-07-02 DIAGNOSIS — I350 Nonrheumatic aortic (valve) stenosis: Secondary | ICD-10-CM | POA: Diagnosis present

## 2016-07-02 DIAGNOSIS — I509 Heart failure, unspecified: Secondary | ICD-10-CM | POA: Diagnosis not present

## 2016-07-02 DIAGNOSIS — E785 Hyperlipidemia, unspecified: Secondary | ICD-10-CM | POA: Diagnosis present

## 2016-07-02 DIAGNOSIS — Z86718 Personal history of other venous thrombosis and embolism: Secondary | ICD-10-CM

## 2016-07-02 DIAGNOSIS — Z880 Allergy status to penicillin: Secondary | ICD-10-CM

## 2016-07-02 DIAGNOSIS — Z833 Family history of diabetes mellitus: Secondary | ICD-10-CM

## 2016-07-02 DIAGNOSIS — Z951 Presence of aortocoronary bypass graft: Secondary | ICD-10-CM

## 2016-07-02 DIAGNOSIS — Z79899 Other long term (current) drug therapy: Secondary | ICD-10-CM

## 2016-07-02 DIAGNOSIS — I252 Old myocardial infarction: Secondary | ICD-10-CM

## 2016-07-02 DIAGNOSIS — E669 Obesity, unspecified: Secondary | ICD-10-CM | POA: Diagnosis present

## 2016-07-02 DIAGNOSIS — Z881 Allergy status to other antibiotic agents status: Secondary | ICD-10-CM

## 2016-07-02 DIAGNOSIS — E1151 Type 2 diabetes mellitus with diabetic peripheral angiopathy without gangrene: Secondary | ICD-10-CM | POA: Diagnosis present

## 2016-07-02 DIAGNOSIS — Z981 Arthrodesis status: Secondary | ICD-10-CM

## 2016-07-02 DIAGNOSIS — Z77090 Contact with and (suspected) exposure to asbestos: Secondary | ICD-10-CM | POA: Diagnosis present

## 2016-07-02 DIAGNOSIS — I5033 Acute on chronic diastolic (congestive) heart failure: Secondary | ICD-10-CM

## 2016-07-02 DIAGNOSIS — I110 Hypertensive heart disease with heart failure: Secondary | ICD-10-CM | POA: Diagnosis not present

## 2016-07-02 DIAGNOSIS — Z7982 Long term (current) use of aspirin: Secondary | ICD-10-CM

## 2016-07-02 DIAGNOSIS — Z961 Presence of intraocular lens: Secondary | ICD-10-CM | POA: Diagnosis present

## 2016-07-02 LAB — COMPREHENSIVE METABOLIC PANEL
ALBUMIN: 3.9 g/dL (ref 3.5–5.0)
ALT: 14 U/L — ABNORMAL LOW (ref 17–63)
AST: 18 U/L (ref 15–41)
Alkaline Phosphatase: 73 U/L (ref 38–126)
Anion gap: 9 (ref 5–15)
BILIRUBIN TOTAL: 1.2 mg/dL (ref 0.3–1.2)
BUN: 33 mg/dL — AB (ref 6–20)
CHLORIDE: 103 mmol/L (ref 101–111)
CO2: 25 mmol/L (ref 22–32)
Calcium: 9.1 mg/dL (ref 8.9–10.3)
Creatinine, Ser: 1.7 mg/dL — ABNORMAL HIGH (ref 0.61–1.24)
GFR calc Af Amer: 44 mL/min — ABNORMAL LOW (ref >60)
GFR, EST NON AFRICAN AMERICAN: 38 mL/min — AB (ref >60)
GLUCOSE: 164 mg/dL — AB (ref 65–99)
POTASSIUM: 3.7 mmol/L (ref 3.5–5.1)
Sodium: 137 mmol/L (ref 135–145)
TOTAL PROTEIN: 6.7 g/dL (ref 6.5–8.1)

## 2016-07-02 LAB — TROPONIN I
TROPONIN I: 0.13 ng/mL — AB (ref <0.03)
TROPONIN I: 0.09 ng/mL — AB (ref <0.03)
Troponin I: 0.12 ng/mL (ref <0.03)

## 2016-07-02 LAB — BRAIN NATRIURETIC PEPTIDE: B Natriuretic Peptide: 1583.7 pg/mL — ABNORMAL HIGH (ref 0.0–100.0)

## 2016-07-02 LAB — CBC WITH DIFFERENTIAL/PLATELET
BASOS ABS: 0 10*3/uL (ref 0.0–0.1)
BASOS PCT: 0 %
EOS ABS: 0 10*3/uL (ref 0.0–0.7)
EOS PCT: 0 %
HCT: 46.1 % (ref 39.0–52.0)
Hemoglobin: 15.2 g/dL (ref 13.0–17.0)
Lymphocytes Relative: 10 %
Lymphs Abs: 1.2 10*3/uL (ref 0.7–4.0)
MCH: 28 pg (ref 26.0–34.0)
MCHC: 33 g/dL (ref 30.0–36.0)
MCV: 84.9 fL (ref 78.0–100.0)
MONO ABS: 1.2 10*3/uL — AB (ref 0.1–1.0)
MONOS PCT: 10 %
NEUTROS ABS: 9.6 10*3/uL — AB (ref 1.7–7.7)
NEUTROS PCT: 80 %
Platelets: 197 10*3/uL (ref 150–400)
RBC: 5.43 MIL/uL (ref 4.22–5.81)
RDW: 18.9 % — AB (ref 11.5–15.5)
WBC: 12 10*3/uL — ABNORMAL HIGH (ref 4.0–10.5)

## 2016-07-02 LAB — PROTIME-INR
INR: 1.37
PROTHROMBIN TIME: 17 s — AB (ref 11.4–15.2)

## 2016-07-02 LAB — GLUCOSE, CAPILLARY: Glucose-Capillary: 183 mg/dL — ABNORMAL HIGH (ref 65–99)

## 2016-07-02 MED ORDER — FUROSEMIDE 10 MG/ML IJ SOLN
60.0000 mg | Freq: Once | INTRAMUSCULAR | Status: AC
  Administered 2016-07-02: 60 mg via INTRAVENOUS
  Filled 2016-07-02: qty 6

## 2016-07-02 MED ORDER — AMLODIPINE BESYLATE 2.5 MG PO TABS
2.5000 mg | ORAL_TABLET | Freq: Every day | ORAL | Status: DC
  Administered 2016-07-02 – 2016-07-03 (×2): 2.5 mg via ORAL
  Filled 2016-07-02 (×2): qty 1

## 2016-07-02 MED ORDER — ONDANSETRON HCL 4 MG/2ML IJ SOLN: 4.0000 mg | Freq: Four times a day (QID) | INTRAMUSCULAR | Status: DC | PRN

## 2016-07-02 MED ORDER — ASPIRIN EC 81 MG PO TBEC
81.0000 mg | DELAYED_RELEASE_TABLET | ORAL | Status: DC
  Administered 2016-07-03: 81 mg via ORAL
  Filled 2016-07-02: qty 1

## 2016-07-02 MED ORDER — DILTIAZEM LOAD VIA INFUSION
15.0000 mg | Freq: Once | INTRAVENOUS | Status: AC
  Administered 2016-07-02: 15 mg via INTRAVENOUS
  Filled 2016-07-02: qty 15

## 2016-07-02 MED ORDER — INSULIN DETEMIR 100 UNIT/ML ~~LOC~~ SOLN
10.0000 [IU] | Freq: Two times a day (BID) | SUBCUTANEOUS | Status: DC
  Administered 2016-07-02 – 2016-07-06 (×7): 10 [IU] via SUBCUTANEOUS
  Filled 2016-07-02 (×8): qty 0.1

## 2016-07-02 MED ORDER — ALBUTEROL SULFATE (2.5 MG/3ML) 0.083% IN NEBU: 3.0000 mL | INHALATION_SOLUTION | Freq: Four times a day (QID) | RESPIRATORY_TRACT | Status: DC | PRN

## 2016-07-02 MED ORDER — IPRATROPIUM BROMIDE 0.06 % NA SOLN
2.0000 | Freq: Every day | NASAL | Status: DC
  Administered 2016-07-03 – 2016-07-06 (×4): 2 via NASAL
  Filled 2016-07-02: qty 15

## 2016-07-02 MED ORDER — CARVEDILOL 6.25 MG PO TABS
6.2500 mg | ORAL_TABLET | Freq: Two times a day (BID) | ORAL | Status: DC
  Administered 2016-07-02 – 2016-07-05 (×7): 6.25 mg via ORAL
  Filled 2016-07-02 (×7): qty 1

## 2016-07-02 MED ORDER — LOSARTAN POTASSIUM 50 MG PO TABS
50.0000 mg | ORAL_TABLET | Freq: Every day | ORAL | Status: DC
  Administered 2016-07-03 – 2016-07-05 (×3): 50 mg via ORAL
  Filled 2016-07-02 (×3): qty 1

## 2016-07-02 MED ORDER — WARFARIN SODIUM 5 MG PO TABS
5.0000 mg | ORAL_TABLET | Freq: Once | ORAL | Status: AC
  Administered 2016-07-02: 5 mg via ORAL
  Filled 2016-07-02 (×2): qty 1

## 2016-07-02 MED ORDER — TRAMADOL HCL 50 MG PO TABS
100.0000 mg | ORAL_TABLET | Freq: Three times a day (TID) | ORAL | Status: DC | PRN
  Administered 2016-07-02 – 2016-07-06 (×7): 100 mg via ORAL
  Filled 2016-07-02 (×8): qty 2

## 2016-07-02 MED ORDER — SODIUM CHLORIDE 0.9% FLUSH: 3.0000 mL | INTRAVENOUS | Status: DC | PRN

## 2016-07-02 MED ORDER — GABAPENTIN 300 MG PO CAPS: 300.0000 mg | ORAL_CAPSULE | Freq: Three times a day (TID) | ORAL | Status: DC | PRN

## 2016-07-02 MED ORDER — POTASSIUM CHLORIDE CRYS ER 20 MEQ PO TBCR
30.0000 meq | EXTENDED_RELEASE_TABLET | Freq: Two times a day (BID) | ORAL | Status: DC
  Administered 2016-07-02 – 2016-07-06 (×8): 30 meq via ORAL
  Filled 2016-07-02 (×8): qty 1

## 2016-07-02 MED ORDER — SODIUM CHLORIDE 0.9 % IV SOLN: 250.0000 mL | INTRAVENOUS | Status: DC | PRN

## 2016-07-02 MED ORDER — NITROGLYCERIN 0.4 MG SL SUBL: 0.4000 mg | SUBLINGUAL_TABLET | SUBLINGUAL | Status: DC | PRN

## 2016-07-02 MED ORDER — SODIUM CHLORIDE 0.9% FLUSH
3.0000 mL | Freq: Two times a day (BID) | INTRAVENOUS | Status: DC
  Administered 2016-07-02 – 2016-07-05 (×7): 3 mL via INTRAVENOUS

## 2016-07-02 MED ORDER — INSULIN ASPART 100 UNIT/ML ~~LOC~~ SOLN
0.0000 [IU] | Freq: Three times a day (TID) | SUBCUTANEOUS | Status: DC
  Administered 2016-07-03: 2 [IU] via SUBCUTANEOUS
  Administered 2016-07-03 – 2016-07-05 (×3): 3 [IU] via SUBCUTANEOUS
  Administered 2016-07-06: 2 [IU] via SUBCUTANEOUS

## 2016-07-02 MED ORDER — DILTIAZEM HCL-DEXTROSE 100-5 MG/100ML-% IV SOLN (PREMIX)
5.0000 mg/h | INTRAVENOUS | Status: DC
  Administered 2016-07-02: 5 mg/h via INTRAVENOUS
  Filled 2016-07-02: qty 100

## 2016-07-02 MED ORDER — ALLOPURINOL 100 MG PO TABS
200.0000 mg | ORAL_TABLET | Freq: Every day | ORAL | Status: DC
  Administered 2016-07-03 – 2016-07-05 (×3): 200 mg via ORAL
  Filled 2016-07-02 (×4): qty 2

## 2016-07-02 MED ORDER — ATORVASTATIN CALCIUM 10 MG PO TABS
10.0000 mg | ORAL_TABLET | Freq: Every day | ORAL | Status: DC
  Administered 2016-07-03 – 2016-07-06 (×4): 10 mg via ORAL
  Filled 2016-07-02 (×4): qty 1

## 2016-07-02 MED ORDER — FUROSEMIDE 10 MG/ML IJ SOLN
80.0000 mg | Freq: Two times a day (BID) | INTRAMUSCULAR | Status: DC
  Administered 2016-07-02 – 2016-07-04 (×4): 80 mg via INTRAVENOUS
  Filled 2016-07-02 (×4): qty 8

## 2016-07-02 MED ORDER — HYPROMELLOSE (GONIOSCOPIC) 2.5 % OP SOLN
2.0000 [drp] | Freq: Every day | OPHTHALMIC | Status: DC
  Administered 2016-07-03: 2 [drp] via OPHTHALMIC
  Filled 2016-07-02: qty 15

## 2016-07-02 MED ORDER — METHOCARBAMOL 500 MG PO TABS: 750.0000 mg | ORAL_TABLET | Freq: Two times a day (BID) | ORAL | Status: DC | PRN

## 2016-07-02 MED ORDER — ACETAMINOPHEN 325 MG PO TABS: 325.0000 mg | ORAL_TABLET | ORAL | Status: DC | PRN

## 2016-07-02 MED ORDER — WARFARIN - PHARMACIST DOSING INPATIENT
Freq: Every day | Status: DC
  Administered 2016-07-03: 17:00:00

## 2016-07-02 MED ORDER — PANTOPRAZOLE SODIUM 40 MG PO TBEC
40.0000 mg | DELAYED_RELEASE_TABLET | Freq: Every day | ORAL | Status: DC
  Administered 2016-07-03 – 2016-07-06 (×4): 40 mg via ORAL
  Filled 2016-07-02 (×4): qty 1

## 2016-07-02 NOTE — ED Triage Notes (Signed)
To ED via private vehicle from home, with c/o shortness of breath-- pt has hx of AFIB-- 100% of the time, per wife. Has AICD pacemaker-- medtronic

## 2016-07-02 NOTE — ED Notes (Signed)
Got patient up on the side of the bed patient using the urinal

## 2016-07-02 NOTE — ED Notes (Signed)
Report given to 3E, RN 

## 2016-07-02 NOTE — ED Provider Notes (Signed)
[facility] DEPT Provider Note   CSN: [id number] Arrival date & time: 07/02/16  1607     History   Chief Complaint Chief Complaint  Patient presents with  . Shortness of Breath    HPI [name] is a 76 y.o. male.  HPI Patient presents the emergency department a history of known coronary artery disease, congestive heart failure, Herman A. fib on anticoagulation.  He presents with increasing exertional shortness of breath and orthopnea over the past 36 hours.  He did have significant urination last night but reports no significant improvement in his breathing.  He presents the emergency department with A. fib with RVR with a rate of 130.  No other complaints at this time.  No recent fever chills.  No recent productive cough.   Past Medical History:  Diagnosis Date  . AAA (abdominal aortic aneurysm) ([place])    Surgical repair 11/2002.  . Adenomatous colon polyp   . AICD (automatic cardioverter/defibrillator) present   . Alcohol ingestion of more than four drinks per week    Excess beer  not a dependency problem  . Aortic valve sclerosis   . Arthritis   . Atrial fibrillation ([place])    Previous long-term amiodarone therapy with multiple cardioversions / amiodarone stopped September, 2009  . Atrial flutter [facility])    Started November, 2010, Left-sided and cannot ablate  . Bony abnormality    Patient's manubrium is slightly displaced to the right  . CAD (coronary artery disease)    a. s/p CABG 2004.  . Cardiac resynchronization therapy defibrillator (CRT-D) in place   . Carotid artery disease ([place])    Doppler, December, 2013, 0-39% bilateral  . Chronic systolic CHF (congestive heart failure) ([place])   . Chronotropic incompetence    IV pacing rate adjusted  . CKD (chronic kidney disease), stage III   . COPD (chronic obstructive pulmonary disease) ([place])   . Dilated aortic root ([place])   . Discolored skin   . Diverticulosis   . Drug therapy    Redness and swelling with  Avelox infusion May 24, 2011  . Eye abnormality    Ophthalmologist questions a clot in one of his eyes, May, 2012  . Gout   . Hyperlipidemia   . Hypertension   . Internal hemorrhoids   . Ischemic cardiomyopathy   . Left atrial thrombus    Remote past... cardioversions done since that time  . Mitral regurgitation    Mild echo  . Myocardial infarction ([place])   . Nasal drainage    Chronic  . Overweight(278.02)   . Pericardial effusion   . Pleural effusion    Large loculated effusion on the left side November, 2011. This was tapped. It was exudative. Cytology revealed no cancer no proof of mesothelioma area pulmonary team felt that no further workup was needed  . Pleural thickening   . Pneumonia   . Polycythemia vera ([place]) 07/28/2014  . SOB (shortness of breath)    Large left effusion/ thoracentesis/hospitalization/November, 2011... exudated.. cytology negative.. Dr.Wert.. no proof of mesothelioma  . Spinal stenosis    Surgery Dr.Elsner  . Thrombophlebitis of superficial veins of upper extremities    Possible venous stenosis from defibrillator  . Type II diabetes mellitus ([place])   . Venous insufficiency    Toe discoloration chronic  . Ventral hernia    April, 2014, result of his abdominal surgery  . Warfarin anticoagulation   . Wide-complex tachycardia [facility])     Patient Active Problem List  Diagnosis Date Noted  . Acute bronchitis 05/29/2016  . Abnormal CXR 05/29/2016  . Effusion of left elbow 04/13/2016  . Acute on chronic congestive heart failure ([place]) 04/13/2016  . Angina pectoris ([place]) 03/15/2016  . History of gout 10/09/2015  . Polycythemia vera ([place]) 07/28/2014  . Ascending aortic aneurysm ([place]) 05/10/2014  . Hypoxia 05/05/2014  . COPD exacerbation ([place]) 05/05/2014  . Diabetes ([place]) 05/05/2014  . Chronic systolic CHF (congestive heart failure) ([place]) 03/07/2014  . History of DVT (deep vein thrombosis) 03/07/2014  . Cardiomyopathy, ischemic 02/05/2014  . Pleural  plaque without asbestosis 01/23/2014  . Elevated hemoglobin ([place]) 01/14/2014  . CKD (chronic kidney disease) stage 3, GFR 30-59 ml/min 01/14/2014  . Asbestos exposure 01/14/2014  . Shortness of breath 01/01/2014  . Ejection fraction   . Hypopotassemia 12/20/2012  . ACE-inhibitor cough 10/04/2012  . Ventral hernia   . Bony abnormality   . Carotid artery disease ([place])   . Spinal cord stimulator status   . Peripheral vascular disease with claudication ([place]) 08/31/2011  . Atrial fibrillation ([place])   . Spinal stenosis   . CAD (coronary artery disease)   . Venous insufficiency   . Mitral regurgitation   . Aortic valve sclerosis   . Alcohol ingestion of more than four drinks per week   . Chronotropic incompetence   . Thrombophlebitis of superficial veins of upper extremities   . Pericardial effusion   . Overweight(278.02)   . Pleural effusion   . Hyperlipidemia   . Hypertension   . Atrial flutter ([place])   . Left atrial thrombus   . AAA (abdominal aortic aneurysm) ([place])   . S/P ICD (internal cardiac defibrillator) procedure   . SOB (shortness of breath)   . Warfarin anticoagulation   . Diabetic polyneuropathy ([place]) 01/07/2010  . DIVERTICULOSIS, COLON 06/10/2009  . Uncontrolled diabetes mellitus with peripheral circulatory disorder ([place]) 03/11/2009  . BACK PAIN, LUMBAR 06/12/2008  . 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 Acute gout 07/27/2006  . CORONARY ARTERY BYPASS GRAFT, HX OF 07/27/2006    Past Surgical History:  Procedure Laterality Date  . ABDOMINAL AORTIC ANEURYSM REPAIR  11/2002  . ANTERIOR CERVICAL DECOMP/DISCECTOMY FUSION  1995  . BACK SURGERY    . BI-VENTRICULAR PACEMAKER INSERTION (CRT-P)  02-11-2013   Pt with previously implanted MDT CRTD downgraded to CRTP by Dr Lovena Le 02-11-13  . CARDIAC CATHETERIZATION  "several"  . CARDIAC CATHETERIZATION N/A 03/18/2016   Procedure: RIGHT/LEFT HEART CATH AND CORONARY/GRAFT ANGIOGRAPHY;  Surgeon: Nelva Bush, MD;  Location: [facility];  Service: Cardiovascular;  Laterality: N/A;  . CATARACT EXTRACTION W/ INTRAOCULAR LENS  IMPLANT, BILATERAL    . COLONOSCOPY W/ POLYPECTOMY    . CORONARY ANGIOPLASTY WITH STENT PLACEMENT  2004  . CORONARY ARTERY BYPASS GRAFT  2004   CABG X4  . FRACTURE SURGERY    . IMPLANTABLE CARDIOVERTER DEFIBRILLATOR (ICD) GENERATOR CHANGE N/A 02/11/2013   Procedure: ICD GENERATOR CHANGE;  Surgeon: Evans Lance, MD;  Location: [facility] CATH LAB;  Service: Cardiovascular;  Laterality: N/A;  . INCISION AND DRAINAGE ABSCESS / HEMATOMA OF BURSA / KNEE / THIGH    . INSERT / REPLACE / REMOVE PACEMAKER  2009   original pacer/defibrillator; Dr. Lovena Le 2009... by the pacing  . LUMBAR FUSION  2010  . ORIF TIBIA & FIBULA FRACTURES Right 2000s  . PILONIDAL CYST EXCISION    . SPINAL CORD STIMULATOR IMPLANT  12/2011  . SURGERY SCROTAL / TESTICULAR  Home Medications    Prior to Admission medications   Medication Sig Start Date End Date Taking? Authorizing Provider  ACCU-CHEK AVIVA PLUS test strip USE ONE STRIP TO CHECK GLUCOSE THREE TIMES DAILY AS DIRECTED 04/29/16  Yes Elayne Snare, MD  ACCU-CHEK FASTCLIX LANCETS MISC USE ONE  TO CHECK GLUCOSE THREE TIMES DAILY 11/30/15  Yes Elayne Snare, MD  acetaminophen (TYLENOL) 325 MG tablet Take 325 mg by mouth every 4 (four) hours as needed for moderate pain.    Yes Historical Provider, MD  Albuterol Sulfate (PROAIR RESPICLICK) 956 (90 Base) MCG/ACT AEPB Inhale 2 puffs into the lungs every 6 (six) hours as needed. For wheezing or shortness of breath. 05/24/16  Yes Charlene Brooke Nche, NP  allopurinol (ZYLOPRIM) 100 MG tablet TAKE TWO TABLETS BY MOUTH ONCE DAILY 04/14/16  Yes [name], MD  amLODipine (NORVASC) 5 MG tablet Take 2.5 mg by mouth daily.   Yes Historical Provider, MD  aspirin 81 MG tablet Take 81 mg by mouth once a week. Take on sundays   Yes Historical Provider, MD  atorvastatin (LIPITOR) 10 MG tablet Take 1 tablet (10 mg  total) by mouth daily. 10/09/15  Yes [name], MD  carboxymethylcellulose (REFRESH PLUS) 0.5 % SOLN Place 2 drops into both eyes daily.    Yes Historical Provider, MD  carvedilol (COREG) 6.25 MG tablet Take 1 tablet (6.25 mg total) by mouth 2 (two) times daily. 08/31/15  Yes Jettie Booze, MD  Cholecalciferol 1000 units tablet Take 1,000 Units by mouth daily.   Yes Historical Provider, MD  colchicine 0.6 MG tablet Take 0.6 mg by mouth daily as needed (gout).    Yes Historical Provider, MD  furosemide (LASIX) 80 MG tablet Take 80 mg by mouth twice daily. May take additional 40 mg daily as needed for weight gain of 2 lbs or more in 24 hour period. 04/11/16  Yes Jettie Booze, MD  gabapentin (NEURONTIN) 300 MG capsule TAKE ONE CAPSULE BY MOUTH 4 TIMES DAILY AS NEEDED FOR LEG PAIN 12/04/15  Yes Elayne Snare, MD  insulin detemir (LEVEMIR) 100 UNIT/ML injection Inject 10 Units into the skin 2 (two) times daily.    Yes Historical Provider, MD  insulin regular (NOVOLIN R) 100 units/mL injection Inject 20 Units into the skin 2 (two) times daily before a meal.    Yes Historical Provider, MD  ipratropium (ATROVENT) 0.06 % nasal spray Place 2 sprays into both nostrils daily.  01/30/13  Yes Historical Provider, MD  losartan (COZAAR) 50 MG tablet TAKE ONE TABLET BY MOUTH ONCE DAILY 02/05/16  Yes Jettie Booze, MD  methocarbamol (ROBAXIN) 750 MG tablet Take 750 mg by mouth 2 (two) times daily as needed for muscle spasms.   Yes Historical Provider, MD  nitroGLYCERIN (NITROSTAT) 0.4 MG SL tablet Place 1 tablet (0.4 mg total) under the tongue every 5 (five) minutes as needed for chest pain (MAX 3 TABLETS). 12/15/14  Yes Carlena Bjornstad, MD  omeprazole (PRILOSEC) 20 MG capsule Take 20 mg by mouth 2 (two) times daily before a meal.   Yes Historical Provider, MD  potassium chloride SA (K-DUR,KLOR-CON) 20 MEQ tablet Take 30 mEq by mouth 2 (two) times daily.   Yes Historical Provider, MD  traMADol (ULTRAM) 50  MG tablet Take 100 mg by mouth every 8 (eight) hours as needed (pain).   Yes Historical Provider, MD  warfarin (COUMADIN) 2.5 MG tablet TAKE ONE TABLET BY MOUTH AS DIRECTED Patient taking differently: takes  1 tablet daily except on wednesdday he takes 2 01/11/16  Yes Jettie Booze, MD    Family History Family History  Problem Relation Age of Onset  . Hypertension Mother   . Stroke Mother   . Diabetes Father   . Coronary artery disease Father   . Other Father     DVT  . Diabetes Brother   . Colon cancer Neg Hx   . Heart attack Neg Hx     Social History Social History  Substance Use Topics  . Smoking status: Former Smoker    Packs/day: 3.00    Years: 39.00    Types: Cigarettes    Start date: 05/24/1956    Quit date: 03/22/1995  . Smokeless tobacco: Never Used  . Alcohol use 1.2 oz/week    2 Cans of beer per week     Allergies   Avelox [moxifloxacin hcl in nacl] and Penicillins   Review of Systems Review of Systems  All other systems reviewed and are negative.    Physical Exam Updated Vital Signs BP (!) 144/108   Pulse 60   Resp (!) 28   Wt (S) 232 lb (105.2 kg) Comment: weighed this am.   SpO2 97%   BMI 31.46 kg/m   Physical Exam  Constitutional: He is oriented to person, place, and time. He appears well-developed and well-nourished.  HENT:  Head: Normocephalic and atraumatic.  Eyes: EOM are normal.  Neck: Normal range of motion.  Cardiovascular:  Tachycardic.  Irregularly irregular.  Pulmonary/Chest: Effort normal. No respiratory distress. He has rales.  Abdominal: Soft. He exhibits no distension. There is no tenderness.  Musculoskeletal: Normal range of motion.  Neurological: He is alert and oriented to person, place, and time.  Skin: Skin is warm and dry.  Psychiatric: He has a normal mood and affect. Judgment normal.  Nursing note and vitals reviewed.    ED Treatments / Results  Labs (all labs ordered are listed, but only abnormal results  are displayed) Labs Reviewed  CBC WITH DIFFERENTIAL/PLATELET - Abnormal; Notable for the following:       Result Value   WBC 12.0 (*)    RDW 18.9 (*)    Neutro Abs 9.6 (*)    Monocytes Absolute 1.2 (*)    All other components within normal limits  COMPREHENSIVE METABOLIC PANEL - Abnormal; Notable for the following:    Glucose, Bld 164 (*)    BUN 33 (*)    Creatinine, Ser 1.70 (*)    ALT 14 (*)    GFR calc non Af Amer 38 (*)    GFR calc Af Amer 44 (*)    All other components within normal limits  TROPONIN I - Abnormal; Notable for the following:    Troponin I 0.13 (*)    All other components within normal limits  BRAIN NATRIURETIC PEPTIDE - Abnormal; Notable for the following:    B Natriuretic Peptide 1,583.7 (*)    All other components within normal limits    EKG  EKG Interpretation  Date/Time:  Saturday July 02 2016 10:17:05 EDT Ventricular Rate:  137 PR Interval:    QRS Duration: 157 QT Interval:  372 QTC Calculation: 562 R Axis:   -93 Text Interpretation:  Atrial fibrillation with rapid ventricular response Ventricular premature complex Nonspecific IVCD with LAD Probable inferior infarct, recent Probable anterolateral infarct, age indeterm rate changed, otherwise no significant change noted Confirmed by Sharlena Kristensen  MD, Letzy Gullickson ([id number]) on 07/02/2016 10:21:33 AM  Radiology Dg Chest Portable 1 View  Result Date: 07/02/2016 CLINICAL DATA:  Shortness of breath. EXAM: PORTABLE CHEST 1 VIEW COMPARISON:  05/23/2016 and prior chest radiographs dating back to 03/15/2013. 05/30/2016 CT. FINDINGS: Cardiomegaly, CABG changes and right-sided pacemaker/ ICD again noted. Pulmonary vascular congestion is now noted. Left lower lung retrocardiac opacity/atelectasis again identified. There is no evidence of pulmonary edema, pneumothorax or other change. IMPRESSION: Cardiomegaly with pulmonary vascular congestion. Unchanged left retrocardiac opacity/ atelectasis. Electronically Signed   By:  Margarette Canada M.D.   On: 07/02/2016 10:47    Procedures Procedures (including critical care time)  Medications Ordered in ED Medications  diltiazem (CARDIZEM) 1 mg/mL load via infusion 15 mg (15 mg Intravenous Bolus from Bag 07/02/16 1049)    And  diltiazem (CARDIZEM) 100 mg in dextrose 5% 11mL (1 mg/mL) infusion (5 mg/hr Intravenous New Bag/Given 07/02/16 1040)  furosemide (LASIX) injection 60 mg (not administered)     Initial Impression / Assessment and Plan / ED Course  I have reviewed the triage vital signs and the nursing notes.  Pertinent labs & imaging results that were available during my care of the patient were reviewed by me and considered in my medical decision making (see chart for details).     Rate control with IV Cardizem.  Suspect prolonged A. fib with RVR leading to CHF exacerbation.  No obvious rate control he will receive IV Lasix.  Patient be admitted the hospital for diuresis.  Final Clinical Impressions(s) / ED Diagnoses   Final diagnoses:  None    New Prescriptions New Prescriptions   No medications on file     Jola Schmidt, MD 07/02/16 1242

## 2016-07-02 NOTE — ED Notes (Signed)
[employer] pacer interrogated-- Yvone Neu called for report -- RV pacing only-- VVI set 50.

## 2016-07-02 NOTE — H&P (Signed)
History & Physical    Patient ID: [name] MRN: [id number], DOB/AGE: 25-May-1940   Admit date: 07/02/2016   Primary Physician: [name], MD Primary Cardiologist: Lendell Caprice, MD    Patient Profile    76 year old male with a prior history of CAD status post coronary artery bypass grafting, chronic atrial fibrillation on Coumadin, left-sided atrial flutter, abdominal aortic aneurysm status post surgical repair, ischemic cardiomyopathy/HFrEF status post biventricular AICD, stage III chronic kidney disease, and obesity, who presents to the emergency department with a 10 day history of progressive dyspnea and weight gain.  Past Medical History    Past Medical History:  Diagnosis Date  . AAA (abdominal aortic aneurysm) ([place])    Surgical repair 11/2002.  . Adenomatous colon polyp   . Alcohol ingestion of more than four drinks per week    Excess beer  not a dependency problem  . Aortic valve sclerosis   . Arthritis   . Atrial fibrillation ([place])    Previous long-term amiodarone therapy with multiple cardioversions / amiodarone stopped September, 2009  . Atrial flutter [facility])    Started November, 2010, Left-sided and cannot ablate  . Bony abnormality    Patient's manubrium is slightly displaced to the right  . CAD (coronary artery disease)    a. s/p CABG 2004;  b. 02/2016 Cath: LM nl, LAD 70p, 136m, D1 50, D2 50, RI 50ost, LCX 32m, OM2 100, OM3 100, RCA 70p, 100d, VG->OM3 ok, LIMA->LAD 60ost, VG->RPDA  ok.  . 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 01/2013 MDT DTBA 1D1 [employer] CRT-D, ser # [id number] H.  . Carotid artery disease ([place])    Doppler, December, 2013, 0-39% bilateral  . Chronic systolic CHF (congestive heart failure) ([place])    a. 02/2016 Echo: EF 35-40%, diff HK, triv AI, mildly dil Ao root 5mm, mild MR, sev dil LA, triv TR.  Marland Kitchen Chronotropic incompetence    IV pacing rate adjusted  . CKD (chronic kidney disease), stage III   . COPD  (chronic obstructive pulmonary disease) ([place])   . Dilated aortic root ([place])   . Discolored skin   . Diverticulosis   . Drug therapy    Redness and swelling with Avelox infusion May 24, 2011  . Eye abnormality    Ophthalmologist questions a clot in one of his eyes, May, 2012  . Gout   . Hyperlipidemia   . Hypertension   . Internal hemorrhoids   . Ischemic cardiomyopathy   . Left atrial thrombus    Remote past... cardioversions done since that time  . Mitral regurgitation    Mild echo  . Myocardial infarction ([place])   . Nasal drainage    Chronic  . Overweight(278.02)   . Pericardial effusion   . Pleural effusion    Large loculated effusion on the left side November, 2011. This was tapped. It was exudative. Cytology revealed no cancer no proof of mesothelioma area pulmonary team felt that no further workup was needed  . Pleural thickening   . Pneumonia   . Polycythemia vera ([place]) 07/28/2014  . SOB (shortness of breath)    Large left effusion/ thoracentesis/hospitalization/November, 2011... exudated.. cytology negative.. Dr.Wert.. no proof of mesothelioma  . Spinal stenosis    Surgery Dr.Elsner  . Thrombophlebitis of superficial veins of upper extremities    Possible venous stenosis from defibrillator  . Type II diabetes mellitus ([place])   . Venous insufficiency    Toe discoloration chronic  .  Ventral hernia    April, 2014, result of his abdominal surgery  . Warfarin anticoagulation   . Wide-complex tachycardia ([place])     Past Surgical History:  Procedure Laterality Date  . ABDOMINAL AORTIC ANEURYSM REPAIR  11/2002  . ANTERIOR CERVICAL DECOMP/DISCECTOMY FUSION  1995  . BACK SURGERY    . BI-VENTRICULAR PACEMAKER INSERTION (CRT-P)  02-11-2013   Pt with previously implanted MDT CRTD downgraded to CRTP by Dr Lovena Le 02-11-13  . CARDIAC CATHETERIZATION  "several"  . CARDIAC CATHETERIZATION N/A 03/18/2016   Procedure: RIGHT/LEFT HEART CATH AND CORONARY/GRAFT ANGIOGRAPHY;  Surgeon:  Nelva Bush, MD;  Location: [facility];  Service: Cardiovascular;  Laterality: N/A;  . CATARACT EXTRACTION W/ INTRAOCULAR LENS  IMPLANT, BILATERAL    . COLONOSCOPY W/ POLYPECTOMY    . CORONARY ANGIOPLASTY WITH STENT PLACEMENT  2004  . CORONARY ARTERY BYPASS GRAFT  2004   CABG X4  . FRACTURE SURGERY    . IMPLANTABLE CARDIOVERTER DEFIBRILLATOR (ICD) GENERATOR CHANGE N/A 02/11/2013   Procedure: ICD GENERATOR CHANGE;  Surgeon: Evans Lance, MD;  Location: [facility] CATH LAB;  Service: Cardiovascular;  Laterality: N/A;  . INCISION AND DRAINAGE ABSCESS / HEMATOMA OF BURSA / KNEE / THIGH    . INSERT / REPLACE / REMOVE PACEMAKER  2009   original pacer/defibrillator; Dr. Lovena Le 2009... by the pacing  . LUMBAR FUSION  2010  . ORIF TIBIA & FIBULA FRACTURES Right 2000s  . PILONIDAL CYST EXCISION    . SPINAL CORD STIMULATOR IMPLANT  12/2011  . SURGERY SCROTAL / TESTICULAR       Allergies  Allergies  Allergen Reactions  . Avelox [Moxifloxacin Hcl In Nacl] Swelling, Rash and Other (See Comments)    Patient became hypotensive after infusion started Because of a history of documented adverse serious drug reaction;Medi Alert bracelet  is recommended  . Penicillins Anaphylaxis, Other (See Comments) and Swelling    REACTION: anaphylaxis Because of a history of documented adverse serious drug reaction;Medi Alert bracelet  is recommended    History of Present Illness    76 year old male with the above complex past medical history including coronary artery disease status post three-vessel bypass in 2004, ischemic cardiomyopathy and HFrEF with an EF of 35-40%, status post [employer] CRT-D in 2014, hypertension, hyperlipidemia, chronic atrial fibrillation on Coumadin therapy, abdominal aortic aneurysm status post repair in 2004, obesity, and stage III chronic kidney disease. He was previously followed by Dr. [name] for many years and is now followed by Dr. Irish Lack.  He was last admitted in December 2017  with chest pain. Catheterization at that time showed patent grafts and medical therapy was recommended. He was doing well with his last follow-up appointment, however about 2 weeks ago, he and his wife went to the beach. While there over the past 2 weeks, they ate out on a regular basis. In that setting, he has noticed increasing dyspnea on exertion and also an 8 pound weight gain over the past week. Last night, he was quite orthopneic and restless. As result of progressive symptoms, he presented to the emergency department today. Here, he was in rapid atrial fibrillation at a rate of 137. His BNP is elevated at 1583. Troponin is mildly elevated at 0.13. Creatinine is 1.7 which is above his last reading (1.32). He is currently symptom free at rest.  Home Medications    Prior to Admission medications   Medication Sig Start Date End Date Taking? Authorizing Provider  ACCU-CHEK AVIVA PLUS test strip USE  ONE STRIP TO CHECK GLUCOSE THREE TIMES DAILY AS DIRECTED 04/29/16  Yes Elayne Snare, MD  ACCU-CHEK FASTCLIX LANCETS MISC USE ONE  TO CHECK GLUCOSE THREE TIMES DAILY 11/30/15  Yes Elayne Snare, MD  acetaminophen (TYLENOL) 325 MG tablet Take 325 mg by mouth every 4 (four) hours as needed for moderate pain.    Yes Historical Provider, MD  Albuterol Sulfate (PROAIR RESPICLICK) 128 (90 Base) MCG/ACT AEPB Inhale 2 puffs into the lungs every 6 (six) hours as needed. For wheezing or shortness of breath. 05/24/16  Yes Charlene Brooke Nche, NP  allopurinol (ZYLOPRIM) 100 MG tablet TAKE TWO TABLETS BY MOUTH ONCE DAILY 04/14/16  Yes [name], MD  amLODipine (NORVASC) 5 MG tablet Take 2.5 mg by mouth daily.   Yes Historical Provider, MD  aspirin 81 MG tablet Take 81 mg by mouth once a week. Take on sundays   Yes Historical Provider, MD  atorvastatin (LIPITOR) 10 MG tablet Take 1 tablet (10 mg total) by mouth daily. 10/09/15  Yes [name], MD  carboxymethylcellulose (REFRESH PLUS) 0.5 % SOLN Place 2 drops into both eyes  daily.    Yes Historical Provider, MD  carvedilol (COREG) 6.25 MG tablet Take 1 tablet (6.25 mg total) by mouth 2 (two) times daily. 08/31/15  Yes Jettie Booze, MD  Cholecalciferol 1000 units tablet Take 1,000 Units by mouth daily.   Yes Historical Provider, MD  colchicine 0.6 MG tablet Take 0.6 mg by mouth daily as needed (gout).    Yes Historical Provider, MD  furosemide (LASIX) 80 MG tablet Take 80 mg by mouth twice daily. May take additional 40 mg daily as needed for weight gain of 2 lbs or more in 24 hour period. 04/11/16  Yes Jettie Booze, MD  gabapentin (NEURONTIN) 300 MG capsule TAKE ONE CAPSULE BY MOUTH 4 TIMES DAILY AS NEEDED FOR LEG PAIN 12/04/15  Yes Elayne Snare, MD  insulin detemir (LEVEMIR) 100 UNIT/ML injection Inject 10 Units into the skin 2 (two) times daily.    Yes Historical Provider, MD  insulin regular (NOVOLIN R) 100 units/mL injection Inject 20 Units into the skin 2 (two) times daily before a meal.    Yes Historical Provider, MD  ipratropium (ATROVENT) 0.06 % nasal spray Place 2 sprays into both nostrils daily.  01/30/13  Yes Historical Provider, MD  losartan (COZAAR) 50 MG tablet TAKE ONE TABLET BY MOUTH ONCE DAILY 02/05/16  Yes Jettie Booze, MD  methocarbamol (ROBAXIN) 750 MG tablet Take 750 mg by mouth 2 (two) times daily as needed for muscle spasms.   Yes Historical Provider, MD  nitroGLYCERIN (NITROSTAT) 0.4 MG SL tablet Place 1 tablet (0.4 mg total) under the tongue every 5 (five) minutes as needed for chest pain (MAX 3 TABLETS). 12/15/14  Yes Carlena Bjornstad, MD  omeprazole (PRILOSEC) 20 MG capsule Take 20 mg by mouth 2 (two) times daily before a meal.   Yes Historical Provider, MD  potassium chloride SA (K-DUR,KLOR-CON) 20 MEQ tablet Take 30 mEq by mouth 2 (two) times daily.   Yes Historical Provider, MD  traMADol (ULTRAM) 50 MG tablet Take 100 mg by mouth every 8 (eight) hours as needed (pain).   Yes Historical Provider, MD  warfarin (COUMADIN) 2.5 MG  tablet TAKE ONE TABLET BY MOUTH AS DIRECTED Patient taking differently: takes 1 tablet daily except on wednesdday he takes 2 01/11/16  Yes Jettie Booze, MD    Family History    Family History  Problem Relation Age of Onset  . Hypertension Mother   . Stroke Mother   . Diabetes Father   . Coronary artery disease Father   . Other Father     DVT  . Diabetes Brother   . Colon cancer Neg Hx   . Heart attack Neg Hx     Social History    Social History   Social History  . Marital status: Married    Spouse name: N/A  . Number of children: N/A  . Years of education: N/A   Occupational History  . Retired- Nurse, mental health    Social History Main Topics  . Smoking status: Former Smoker    Packs/day: 3.00    Years: 39.00    Types: Cigarettes    Start date: 05/24/1956    Quit date: 03/22/1995  . Smokeless tobacco: Never Used  . Alcohol use 1.2 oz/week    2 Cans of beer per week  . Drug use: No  . Sexual activity: No   Other Topics Concern  . Not on file   Social History Narrative  . No narrative on file     Review of Systems    General:  No chills, fever, night sweats or weight changes.  Cardiovascular:  No chest pain, +++ ndyspnea on exertion, +++ edema, +++ orthopnea, no palpitations, paroxysmal nocturnal dyspnea. Dermatological: No rash, lesions/masses Respiratory: No cough, +++ dyspnea Urologic: No hematuria, dysuria Abdominal:   +++ Increase in abdominal girth. No nausea, vomiting, diarrhea, bright red blood per rectum, melena, or hematemesis Neurologic:  No visual changes, wkns, changes in mental status. All other systems reviewed and are otherwise negative except as noted above.  Physical Exam    Blood pressure (!) 144/108, pulse 60, resp. rate (!) 28, weight (S) 232 lb (105.2 kg), SpO2 97 %.  General: Pleasant, NAD Psych: Normal affect. Neuro: Alert and oriented X 3. Moves all extremities spontaneously. HEENT: Normal  Neck: Supple without  bruits. JVD to jaw. Lungs:  Resp regular and unlabored, crackles noted halfway up bilaterally. Heart: Irregularly irregular, no s3, s4, or murmurs. Abdomen: Firm, protuberant, nontender, BS + x 4.  Extremities: No clubbing, cyanosis.  He has  2+ bilateral lower extremity edema. DP/PT/Radials 2+ and equal bilaterally.  Labs     Recent Labs  07/02/16 1027  TROPONINI 0.13*   Lab Results  Component Value Date   WBC 12.0 (H) 07/02/2016   HGB 15.2 07/02/2016   HCT 46.1 07/02/2016   MCV 84.9 07/02/2016   PLT 197 07/02/2016    Recent Labs Lab 07/02/16 1027  NA 137  K 3.7  CL 103  CO2 25  BUN 33*  CREATININE 1.70*  CALCIUM 9.1  PROT 6.7  BILITOT 1.2  ALKPHOS 73  ALT 14*  AST 18  GLUCOSE 164*   Lab Results  Component Value Date   CHOL 124 03/16/2016   HDL 41 03/16/2016   LDLCALC 64 03/16/2016   TRIG 95 03/16/2016    Radiology Studies    Dg Chest Portable 1 View  Result Date: 07/02/2016 CLINICAL DATA:  Shortness of breath. EXAM: PORTABLE CHEST 1 VIEW COMPARISON:  05/23/2016 and prior chest radiographs dating back to 03/15/2013. 05/30/2016 CT. FINDINGS: Cardiomegaly, CABG changes and right-sided pacemaker/ ICD again noted. Pulmonary vascular congestion is now noted. Left lower lung retrocardiac opacity/atelectasis again identified. There is no evidence of pulmonary edema, pneumothorax or other change. IMPRESSION: Cardiomegaly with pulmonary vascular congestion. Unchanged left retrocardiac opacity/ atelectasis. Electronically Signed   By:  Margarette Canada M.D.   On: 07/02/2016 10:47    ECG & Cardiac Imaging    Atrial fibrillation, 137, left axis deviation, intraventricular conduction delay, anterolateral infarct-no acute changes other than elevated rate.  Assessment & Plan    1. Acute on chronic systolic congestive heart failure/HFrEF/ischemic cardiomyopathy: Patient presents with a 10 day history of progressive dyspnea on exertion with an 8 pound weight gain over the past  week in the setting of probable dietary indiscretion as he has been at the beach and eating out a regular basis. He has been compliant with his medications. He had worsening orthopnea last night. He has significant volume overload on exam with JVD, crackles, abdominal fullness, and lower extremity edema. His BNP is 1583. Last echo in December showed an EF of 3540%. Chest x-ray shows vascular congestion. Plan to admit and place on IV Lasix for aggressive diuresis. Continue beta blocker and ARB therapy. Consider transitioning from losartan to [facility] given elevated blood pressure.  2. Coronary artery disease/elevated troponin. Troponin is mildly elevated at 0.13 in the setting of acute systolic heart failure. He has not had any chest pain. Catheterization December showed 3 of 3 patent grafts. Cycle cardiac enzymes. If trend remains flat, would not pursue additional ischemic evaluation at this time. Continue aspirin, statin, beta blocker, and ARB therapy. Next  3. Hypertensive heart disease with congestive heart failure: Blood pressure currently elevated in the setting of some amount of distress upon arrival. Follow with diuresis and resumption of meds. As above, consider transitioning from losartan to [facility] if this is financially feasible.  4. Hyperlipidemia: Continue statin therapy. LDL was 64 in December 2017.  5. Chronic atrial fibrillation: Patient has chronic stable rate control atrial fibrillation, the rate is elevated in the setting of heart failure. He was placed on IV diltiazem in the emergency department. Plan to discontinue this and resume beta blocker. Suspect rates will improved with diuresis. Continue Coumadin therapy.  6. Stage III chronic kidney disease: Creatinine is 1.7. Follow with diuresis.   Signed, Murray Hodgkins, NP 07/02/2016, 3:15 PM  Attending Note:   The patient was seen and examined.  Agree with assessment and plan as noted above.  Changes made to the above note as  needed.  Patient seen and independently examined with Ignacia Bayley, NP.   We discussed all aspects of the encounter. I agree with the assessment and plan as stated above.  1.  Acute on chronic diastolic CHF:   Has had a progressive weight gain for the past week - likely due to eating out while on vacation. He takes a high dose of lasix - 80 bid.  Encouraged him to avoid eating salty foods when he goes out .   If he does, he may need to take a 3rd dose of lasix.   2. Chronic atrial fib:   Rate is a bit fast today .  Likely due to his chf. I do not think he will need the Dilt drip going forward  Will restart the Coreg.   3.  Mild troponin elevations; Likely due to demand ischemia.   No angina.  No need for further work up at this time     I have spent a total of 40 minutes with patient reviewing hospital  notes , telemetry, EKGs, labs and examining patient as well as establishing an assessment and plan that was discussed with the patient. > 50% of time was spent in direct patient care.    Wonda Cheng.  Janace Litten., MD, [facility] 07/02/2016, 3:27 PM [address],  [place] Pager [phone]

## 2016-07-02 NOTE — Progress Notes (Signed)
ANTICOAGULATION CONSULT NOTE - Initial Consult  Pharmacy Consult for coumadin Indication: atrial fibrillation  Allergies  Allergen Reactions  . Avelox [Moxifloxacin Hcl In Nacl] Swelling, Rash and Other (See Comments)    Patient became hypotensive after infusion started Because of a history of documented adverse serious drug reaction;Medi Alert bracelet  is recommended  . Penicillins Anaphylaxis, Other (See Comments) and Swelling    REACTION: anaphylaxis Because of a history of documented adverse serious drug reaction;Medi Alert bracelet  is recommended    Patient Measurements: Weight: (S) 232 lb (105.2 kg) (weighed this am. )  Vital Signs: BP: 139/94 (04/14 1630) Pulse Rate: 82 (04/14 1615)  Labs:  Recent Labs  07/02/16 1027 07/02/16 1307  HGB 15.2  --   HCT 46.1  --   PLT 197  --   LABPROT  --  17.0*  INR  --  1.37  CREATININE 1.70*  --   TROPONINI 0.13*  --     Estimated Creatinine Clearance: 47.1 mL/min (A) (by C-G formula based on SCr of 1.7 mg/dL (H)).   Medical History: Past Medical History:  Diagnosis Date  . AAA (abdominal aortic aneurysm) ([place])    Surgical repair 11/2002.  . Adenomatous colon polyp   . Alcohol ingestion of more than four drinks per week    Excess beer  not a dependency problem  . Aortic valve sclerosis   . Arthritis   . Atrial fibrillation ([place])    Previous long-term amiodarone therapy with multiple cardioversions / amiodarone stopped September, 2009  . Atrial flutter [facility])    Started November, 2010, Left-sided and cannot ablate  . Bony abnormality    Patient's manubrium is slightly displaced to the right  . CAD (coronary artery disease)    a. s/p CABG 2004;  b. 02/2016 Cath: LM nl, LAD 70p, 135m, D1 50, D2 50, RI 50ost, LCX 74m, OM2 100, OM3 100, RCA 70p, 100d, VG->OM3 ok, LIMA->LAD 60ost, VG->RPDA  ok.  . 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 01/2013 MDT DTBA 1D1 [place] [employer] CRT-D, ser # [id number] H.   . Carotid artery disease ([place])    Doppler, December, 2013, 0-39% bilateral  . Chronic systolic CHF (congestive heart failure) ([place])    a. 02/2016 Echo: EF 35-40%, diff HK, triv AI, mildly dil Ao root 35mm, mild MR, sev dil LA, triv TR.  Marland Kitchen Chronotropic incompetence    IV pacing rate adjusted  . CKD (chronic kidney disease), stage III   . COPD (chronic obstructive pulmonary disease) ([place])   . Dilated aortic root ([place])   . Discolored skin   . Diverticulosis   . Drug therapy    Redness and swelling with Avelox infusion May 24, 2011  . Eye abnormality    Ophthalmologist questions a clot in one of his eyes, May, 2012  . Gout   . Hyperlipidemia   . Hypertension   . Internal hemorrhoids   . Ischemic cardiomyopathy   . Left atrial thrombus    Remote past... cardioversions done since that time  . Mitral regurgitation    Mild echo  . Myocardial infarction ([place])   . Nasal drainage    Chronic  . Overweight(278.02)   . Pericardial effusion   . Pleural effusion    Large loculated effusion on the left side November, 2011. This was tapped. It was exudative. Cytology revealed no cancer no proof of mesothelioma area pulmonary team felt that no further workup was needed  . Pleural thickening   .  Pneumonia   . Polycythemia vera ([place]) 07/28/2014  . SOB (shortness of breath)    Large left effusion/ thoracentesis/hospitalization/November, 2011... exudated.. cytology negative.. Dr.Wert.. no proof of mesothelioma  . Spinal stenosis    Surgery Dr.Elsner  . Thrombophlebitis of superficial veins of upper extremities    Possible venous stenosis from defibrillator  . Type II diabetes mellitus ([place])   . Venous insufficiency    Toe discoloration chronic  . Ventral hernia    April, 2014, result of his abdominal surgery  . Warfarin anticoagulation   . Wide-complex tachycardia [facility])      Assessment: 76 yo male here with HF and also with chronic afib (CHADSVASC=5). Pharmacy consulted to dose  coumadin -INR= 1.37 Coumadin dose PTA: 2.5mg /d except 5mg  on Wed  (last taken 4/13)  Goal of Therapy:  INR 2-3 Monitor platelets by anticoagulation protocol: Yes   Plan:  -Coumadin 5mg  po today -Daily PT/INR  Hildred Laser, Pharm D 07/02/2016 4:46 PM   e

## 2016-07-03 DIAGNOSIS — I5023 Acute on chronic systolic (congestive) heart failure: Secondary | ICD-10-CM

## 2016-07-03 LAB — PROTIME-INR
INR: 1.58
Prothrombin Time: 19 seconds — ABNORMAL HIGH (ref 11.4–15.2)

## 2016-07-03 LAB — TROPONIN I: Troponin I: 0.11 ng/mL (ref <0.03)

## 2016-07-03 LAB — BASIC METABOLIC PANEL
ANION GAP: 9 (ref 5–15)
BUN: 31 mg/dL — ABNORMAL HIGH (ref 6–20)
CO2: 29 mmol/L (ref 22–32)
Calcium: 9 mg/dL (ref 8.9–10.3)
Chloride: 102 mmol/L (ref 101–111)
Creatinine, Ser: 1.49 mg/dL — ABNORMAL HIGH (ref 0.61–1.24)
GFR calc Af Amer: 51 mL/min — ABNORMAL LOW (ref >60)
GFR, EST NON AFRICAN AMERICAN: 44 mL/min — AB (ref >60)
GLUCOSE: 135 mg/dL — AB (ref 65–99)
POTASSIUM: 3.7 mmol/L (ref 3.5–5.1)
Sodium: 140 mmol/L (ref 135–145)

## 2016-07-03 LAB — GLUCOSE, CAPILLARY
GLUCOSE-CAPILLARY: 132 mg/dL — AB (ref 65–99)
GLUCOSE-CAPILLARY: 111 mg/dL — AB (ref 65–99)
Glucose-Capillary: 154 mg/dL — ABNORMAL HIGH (ref 65–99)
Glucose-Capillary: 138 mg/dL — ABNORMAL HIGH (ref 65–99)

## 2016-07-03 LAB — HEMOGLOBIN A1C
Hgb A1c MFr Bld: 6.8 % — ABNORMAL HIGH (ref 4.8–5.6)
MEAN PLASMA GLUCOSE: 148 mg/dL

## 2016-07-03 MED ORDER — POLYVINYL ALCOHOL 1.4 % OP SOLN: 1.0000 [drp] | OPHTHALMIC | Status: DC | PRN

## 2016-07-03 MED ORDER — WARFARIN SODIUM 5 MG PO TABS
5.0000 mg | ORAL_TABLET | Freq: Once | ORAL | Status: AC
  Administered 2016-07-03: 5 mg via ORAL
  Filled 2016-07-03: qty 1

## 2016-07-03 NOTE — Progress Notes (Signed)
ANTICOAGULATION CONSULT NOTE - Follow Up Consult  Pharmacy Consult for Warfarin Indication: atrial fibrillation  Assessment: 76 yo male admitted 4/14 for progressive dyspnea and weight gain. Patient was anticoagulated with warfarin pta for Afib (CHADSVASC=5). Home dose is 2.5 mg daily, except 5 mg on Wed (last dose 4/13, INR on admission 1.37, subtherapeutic). Pharmacy consulted to manage warfarin.   Today's INR is 1.58 (subtherapeutic) but rising nicely, hemoglobin and platelets are wnl, no overt bleeding noted.   Goal of Therapy:  INR 2-3 Monitor platelets by anticoagulation protocol: Yes   Plan:  Warfarin 5 mg x 1 tonight Monitor daily INR and s/sx's of bleeding   Allergies  Allergen Reactions  . Avelox [Moxifloxacin Hcl In Nacl] Swelling, Rash and Other (See Comments)    Patient became hypotensive after infusion started Because of a history of documented adverse serious drug reaction;Medi Alert bracelet  is recommended  . Penicillins Anaphylaxis, Other (See Comments) and Swelling    REACTION: anaphylaxis Because of a history of documented adverse serious drug reaction;Medi Alert bracelet  is recommended    Patient Measurements: Height: 6' (182.9 cm) Weight: 226 lb 6.4 oz (102.7 kg) IBW/kg (Calculated) : 77.6  Vital Signs: Temp: 97.8 F (36.6 C) (04/15 0803) Temp Source: Oral (04/15 0803) BP: 125/84 (04/15 0803) Pulse Rate: 100 (04/15 0803)  Labs:  Recent Labs  07/02/16 1027 07/02/16 1307 07/02/16 1739 07/02/16 2228 07/03/16 0417  HGB 15.2  --   --   --   --   HCT 46.1  --   --   --   --   PLT 197  --   --   --   --   LABPROT  --  17.0*  --   --  19.0*  INR  --  1.37  --   --  1.58  CREATININE 1.70*  --   --   --  1.49*  TROPONINI 0.13*  --  0.12* 0.09* 0.11*    Estimated Creatinine Clearance: 53.1 mL/min (A) (by C-G formula based on SCr of 1.49 mg/dL (H)).   Medications:  Scheduled:  . allopurinol  200 mg Oral Daily  . amLODipine  2.5 mg Oral Daily   . aspirin EC  81 mg Oral Q Sun  . atorvastatin  10 mg Oral Daily  . carvedilol  6.25 mg Oral BID  . furosemide  80 mg Intravenous BID  . insulin aspart  0-15 Units Subcutaneous TID WC  . insulin detemir  10 Units Subcutaneous BID  . ipratropium  2 spray Each Nare Daily  . losartan  50 mg Oral Daily  . pantoprazole  40 mg Oral Daily  . potassium chloride SA  30 mEq Oral BID  . sodium chloride flush  3 mL Intravenous Q12H  . warfarin  5 mg Oral ONCE-1800  . Warfarin - Pharmacist Dosing Inpatient   Does not apply [id number]    Belia Heman, PharmD PGY1 Pharmacy Resident [phone] (Pager) 07/03/2016 10:59 AM

## 2016-07-03 NOTE — Progress Notes (Signed)
Pt is alert times 4, vitals stable,  NSR on monitor, no any other specific complaints at this point, having good urine output, will continue to monitor the patient.

## 2016-07-03 NOTE — Progress Notes (Signed)
Progress Note  Patient Name: [name] Date of Encounter: 07/03/2016  Primary Cardiologist: Dr. Irish Lack  Subjective   No chest pain.  Breathing is still not at baseline.    Inpatient Medications    Scheduled Meds: . allopurinol  200 mg Oral Daily  . amLODipine  2.5 mg Oral Daily  . aspirin EC  81 mg Oral Q Sun  . atorvastatin  10 mg Oral Daily  . carvedilol  6.25 mg Oral BID  . furosemide  80 mg Intravenous BID  . hydroxypropyl methylcellulose / hypromellose  2 drop Both Eyes Daily  . insulin aspart  0-15 Units Subcutaneous TID WC  . insulin detemir  10 Units Subcutaneous BID  . ipratropium  2 spray Each Nare Daily  . losartan  50 mg Oral Daily  . pantoprazole  40 mg Oral Daily  . potassium chloride SA  30 mEq Oral BID  . sodium chloride flush  3 mL Intravenous Q12H  . Warfarin - Pharmacist Dosing Inpatient   Does not apply q1800   Continuous Infusions:  PRN Meds: sodium chloride, acetaminophen, albuterol, gabapentin, methocarbamol, nitroGLYCERIN, ondansetron (ZOFRAN) IV, sodium chloride flush, traMADol   Vital Signs    Vitals:   07/02/16 2001 07/03/16 0012 07/03/16 0500 07/03/16 0803  BP: 128/89 129/76 121/88 125/84  Pulse: 62 70 68 100  Resp: 20 18 18 18   Temp: 97.5 F (36.4 C) 97.9 F (36.6 C) 98.1 F (36.7 C) 97.8 F (36.6 C)  TempSrc: Oral Oral Oral Oral  SpO2: 96% 95% 94% 95%  Weight: 230 lb 8 oz (104.6 kg)  226 lb 6.4 oz (102.7 kg)   Height: 6' (1.829 m)       Intake/Output Summary (Last 24 hours) at 07/03/16 1037 Last data filed at 07/03/16 0815  Gross per 24 hour  Intake              365 ml  Output             4150 ml  Net            -3785 ml   Filed Weights   07/02/16 1053 07/02/16 2001 07/03/16 0500  Weight: (S) 232 lb (105.2 kg) 230 lb 8 oz (104.6 kg) 226 lb 6.4 oz (102.7 kg)    Telemetry    Atrial fib with rate controlled - Personally Reviewed  ECG    NA - Personally Reviewed  Physical Exam   GEN: No acute distress.     Neck: No  JVD Cardiac: IrregularRR,  murmurs, rubs, or gallops.  Respiratory: Clear  to auscultation bilaterally. GI: Soft, nontender, non-distended  MS: Mild edema; No deformity. Neuro:  Nonfocal  Psych: Normal affect   Labs    Chemistry Recent Labs Lab 07/02/16 1027 07/03/16 0417  NA 137 140  K 3.7 3.7  CL 103 102  CO2 25 29  GLUCOSE 164* 135*  BUN 33* 31*  CREATININE 1.70* 1.49*  CALCIUM 9.1 9.0  PROT 6.7  --   ALBUMIN 3.9  --   AST 18  --   ALT 14*  --   ALKPHOS 73  --   BILITOT 1.2  --   GFRNONAA 38* 44*  GFRAA 44* 51*  ANIONGAP 9 9     Hematology Recent Labs Lab 07/02/16 1027  WBC 12.0*  RBC 5.43  HGB 15.2  HCT 46.1  MCV 84.9  MCH 28.0  MCHC 33.0  RDW 18.9*  PLT 197    Cardiac Enzymes  Recent Labs Lab 07/02/16 1027 07/02/16 1739 07/02/16 2228 07/03/16 0417  TROPONINI 0.13* 0.12* 0.09* 0.11*   No results for input(s): TROPIPOC in the last 168 hours.   BNP Recent Labs Lab 07/02/16 1027  BNP 1,583.7*     DDimer No results for input(s): DDIMER in the last 168 hours.   Radiology    Dg Chest Portable 1 View  Result Date: 07/02/2016 CLINICAL DATA:  Shortness of breath. EXAM: PORTABLE CHEST 1 VIEW COMPARISON:  05/23/2016 and prior chest radiographs dating back to 03/15/2013. 05/30/2016 CT. FINDINGS: Cardiomegaly, CABG changes and right-sided pacemaker/ ICD again noted. Pulmonary vascular congestion is now noted. Left lower lung retrocardiac opacity/atelectasis again identified. There is no evidence of pulmonary edema, pneumothorax or other change. IMPRESSION: Cardiomegaly with pulmonary vascular congestion. Unchanged left retrocardiac opacity/ atelectasis. Electronically Signed   By: Margarette Canada M.D.   On: 07/02/2016 10:47    Cardiac Studies   NA  Patient Profile     76 y.o. 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  Assessment & Plan    ACUTE ON CHRONIC SYSTOLIC HF:  Down 4 liters since admission.   Continue IV diuresis.   CAD:  Mildly elevated troponin thought to be demand ischemia.  Cath last Dec.  No further invasive work up at this time in the absence of symptoms.    HTN:  BP is well controlled on the current meds.    HYPERLIPIDEMIA:  Lipids not drawn this admission can follow up as an outpatient.   CHRONIC ATRIAL FIB:  Rate is controlled.  [name] has a CHA2DS2 - VASc score of 6.   CKD STAGE III:    Creat is actually improved with diuresis.      Signed, Minus Breeding, MD  07/03/2016, 10:37 AM

## 2016-07-03 NOTE — Progress Notes (Signed)
Troponin 0.12, was 0.13, no s/s, MD notified, will continue to monitor, Thanks Arvella Nigh RN

## 2016-07-04 ENCOUNTER — Other Ambulatory Visit: Payer: Medicare Other

## 2016-07-04 DIAGNOSIS — I2510 Atherosclerotic heart disease of native coronary artery without angina pectoris: Secondary | ICD-10-CM

## 2016-07-04 DIAGNOSIS — I4891 Unspecified atrial fibrillation: Secondary | ICD-10-CM

## 2016-07-04 LAB — BASIC METABOLIC PANEL
Anion gap: 11 (ref 5–15)
Anion gap: 11 (ref 5–15)
BUN: 36 mg/dL — AB (ref 6–20)
BUN: 39 mg/dL — ABNORMAL HIGH (ref 6–20)
CALCIUM: 8.7 mg/dL — AB (ref 8.9–10.3)
CALCIUM: 8.7 mg/dL — AB (ref 8.9–10.3)
CHLORIDE: 102 mmol/L (ref 101–111)
CO2: 27 mmol/L (ref 22–32)
CO2: 28 mmol/L (ref 22–32)
CREATININE: 1.61 mg/dL — AB (ref 0.61–1.24)
Chloride: 101 mmol/L (ref 101–111)
Creatinine, Ser: 1.6 mg/dL — ABNORMAL HIGH (ref 0.61–1.24)
GFR calc Af Amer: 47 mL/min — ABNORMAL LOW (ref >60)
GFR calc Af Amer: 47 mL/min — ABNORMAL LOW (ref >60)
GFR calc non Af Amer: 40 mL/min — ABNORMAL LOW (ref >60)
GFR, EST NON AFRICAN AMERICAN: 41 mL/min — AB (ref >60)
GLUCOSE: 114 mg/dL — AB (ref 65–99)
Glucose, Bld: 124 mg/dL — ABNORMAL HIGH (ref 65–99)
Potassium: 3.2 mmol/L — ABNORMAL LOW (ref 3.5–5.1)
Potassium: 3.7 mmol/L (ref 3.5–5.1)
SODIUM: 140 mmol/L (ref 135–145)
Sodium: 140 mmol/L (ref 135–145)

## 2016-07-04 LAB — GLUCOSE, CAPILLARY
GLUCOSE-CAPILLARY: 154 mg/dL — AB (ref 65–99)
Glucose-Capillary: 118 mg/dL — ABNORMAL HIGH (ref 65–99)
Glucose-Capillary: 171 mg/dL — ABNORMAL HIGH (ref 65–99)
Glucose-Capillary: 108 mg/dL — ABNORMAL HIGH (ref 65–99)

## 2016-07-04 LAB — MAGNESIUM: Magnesium: 2.4 mg/dL (ref 1.7–2.4)

## 2016-07-04 LAB — PROTIME-INR
INR: 1.77
PROTHROMBIN TIME: 20.9 s — AB (ref 11.4–15.2)

## 2016-07-04 MED ORDER — DIGOXIN 125 MCG PO TABS
0.1250 mg | ORAL_TABLET | Freq: Every day | ORAL | Status: DC
Start: 2016-07-05 — End: 2016-07-06
  Administered 2016-07-05 – 2016-07-06 (×2): 0.125 mg via ORAL
  Filled 2016-07-04 (×2): qty 1

## 2016-07-04 MED ORDER — MAGNESIUM OXIDE 400 (241.3 MG) MG PO TABS
400.0000 mg | ORAL_TABLET | Freq: Every day | ORAL | Status: DC
  Administered 2016-07-04 – 2016-07-06 (×3): 400 mg via ORAL
  Filled 2016-07-04 (×3): qty 1

## 2016-07-04 MED ORDER — DILTIAZEM HCL 30 MG PO TABS: 30.0000 mg | ORAL_TABLET | Freq: Four times a day (QID) | ORAL | Status: DC

## 2016-07-04 MED ORDER — DIGOXIN 125 MCG PO TABS: 0.1250 mg | ORAL_TABLET | Freq: Every day | ORAL | Status: DC

## 2016-07-04 MED ORDER — WARFARIN SODIUM 5 MG PO TABS
5.0000 mg | ORAL_TABLET | Freq: Once | ORAL | Status: AC
  Administered 2016-07-04: 5 mg via ORAL
  Filled 2016-07-04: qty 1

## 2016-07-04 MED ORDER — SPIRONOLACTONE 25 MG PO TABS
12.5000 mg | ORAL_TABLET | Freq: Every day | ORAL | Status: DC
  Administered 2016-07-04 – 2016-07-05 (×2): 12.5 mg via ORAL
  Filled 2016-07-04 (×2): qty 1

## 2016-07-04 MED ORDER — KETOTIFEN FUMARATE 0.025 % OP SOLN
1.0000 [drp] | Freq: Two times a day (BID) | OPHTHALMIC | Status: DC
  Administered 2016-07-04 – 2016-07-06 (×5): 1 [drp] via OPHTHALMIC
  Filled 2016-07-04: qty 5

## 2016-07-04 MED ORDER — TORSEMIDE 20 MG PO TABS
60.0000 mg | ORAL_TABLET | Freq: Every day | ORAL | Status: DC
  Administered 2016-07-05 – 2016-07-06 (×2): 60 mg via ORAL
  Filled 2016-07-04 (×2): qty 3

## 2016-07-04 MED ORDER — ENOXAPARIN SODIUM 100 MG/ML ~~LOC~~ SOLN
100.0000 mg | Freq: Two times a day (BID) | SUBCUTANEOUS | Status: DC
  Administered 2016-07-04: 100 mg via SUBCUTANEOUS
  Filled 2016-07-04: qty 1

## 2016-07-04 MED ORDER — DIGOXIN 125 MCG PO TABS
0.2500 mg | ORAL_TABLET | ORAL | Status: DC
  Administered 2016-07-04 (×2): 0.25 mg via ORAL
  Filled 2016-07-04 (×2): qty 2

## 2016-07-04 MED ORDER — POTASSIUM CHLORIDE CRYS ER 20 MEQ PO TBCR
20.0000 meq | EXTENDED_RELEASE_TABLET | Freq: Once | ORAL | Status: AC
  Administered 2016-07-04: 20 meq via ORAL
  Filled 2016-07-04: qty 1

## 2016-07-04 MED ORDER — ENOXAPARIN SODIUM 100 MG/ML ~~LOC~~ SOLN
100.0000 mg | SUBCUTANEOUS | Status: AC
  Administered 2016-07-04: 100 mg via SUBCUTANEOUS
  Filled 2016-07-04: qty 1

## 2016-07-04 MED ORDER — FUROSEMIDE 80 MG PO TABS
80.0000 mg | ORAL_TABLET | Freq: Two times a day (BID) | ORAL | Status: AC
  Administered 2016-07-04: 80 mg via ORAL
  Filled 2016-07-04: qty 1

## 2016-07-04 NOTE — Discharge Instructions (Signed)

## 2016-07-04 NOTE — Consult Note (Signed)
Advanced Heart Failure Team Consult Note   Primary Physician: Dr Quay Burow  Primary Cardiologist:  Dr Irish Lack  Reason for Consultation: Heart Failure   HPI:    [name] is seen today for evaluation of Heart Failure at the request of Dr Loleta Chance.   [name] is 76 year old with a history of CAD, CABG, chronic A fib multiple cardioversion's, S/P AAA, ICM, chronic systolic heart failure, Medtronic CRT-D, CKD Stage III, DMII, and obesity.  Admitted 03/15/16 with CP and volume overload. LHC showed 03/18/16 showing diffusely diseased prox/mid RCA with distal RCA occlusion, hazy 70% pLAD and 100% mLAD with 55% mLCx stenosis. Also patent LIMA to LAD with 60% ostial LIMA stenosis, and patent SVG to OM, and SVG to PDA. LVEDP was 21.Transitioned to lasix 80 mg po twice a day. Discharge weight 241 pounds.   Prior to admit he had been on vacation and eating out. Weight had been trending up. He has not missed any medications. He is on chronic coumadin for chronic A fib. In the past he was on amiodarone but this was stopped 2009 after 4 failed cardioversion's. He is short of breath with exertion. Says this is his baseline. Has not had sleep study.    Admitted 4/14 with increased dyspnea and weight gain in the setting of high salt diet. CXR with vascular congestion. Diuresing with IV lasix.  On admit he was in A fib RVR and placed on diltiazem drip that was stopped today. Pertinent admission labs include: sodium 137, K 3.7, Creatinine 1.7, BNP 1583. Troponin 0.13>0.12> 0.09. Weight has been trending down from 232 to 224 pounds. No CP. Remains SOB with exertion.   Cardiac Studies ECHO 02/2016-  EF 35-40%. Diffuse hypokinesis.   [facility] 03/18/2017  CO/CI 4.97/2.11 1. Severe native coronary artery disease, including diffuse proximal LAD stenosis up to 70% and mid LAD occlusion, 70% mid LCx disease and occlusion of OM2 and OM3, and diffusely diseased mid RCA with chronic total occlusion of the  distal RCA. 2. Patent LIMA to LAD supplying small to medium-caliber distal LAD with 60% ostial LIMA stenosis. 3. Patent SVG to distal OM. Haziness in the proximal segment of the vein graft most likely represents a valve in the similar in appearance to the catheterization from 2008. 4. Patent SVG to PDA with two valves that appear similar to 2008.  Review of Systems: [y] = yes, [ ]  = no   General: Weight gain [Y ]; Weight loss [ ] ; Anorexia [ ] ; Fatigue [ ] ; Fever [ ] ; Chills [ ] ; Weakness [ Y]  Cardiac: Chest pain/pressure [ ] ; Resting SOB [ ] ; Exertional SOB [ ] ; Orthopnea [ ] ; Pedal Edema [ ] ; Palpitations [ ] ; Syncope [ ] ; Presyncope [ ] ; Paroxysmal nocturnal dyspnea[ ]   Pulmonary: Cough [ ] ; Wheezing[ ] ; Hemoptysis[ ] ; Sputum [ ] ; Snoring [ ]   GI: Vomiting[ ] ; Dysphagia[ ] ; Melena[ ] ; Hematochezia [ ] ; Heartburn[ ] ; Abdominal pain [ ] ; Constipation [ ] ; Diarrhea [ ] ; BRBPR [ ]   GU: Hematuria[ ] ; Dysuria [ ] ; Nocturia[ ]   Vascular: Pain in legs with walking [ ] ; Pain in feet with lying flat [ ] ; Non-healing sores [ ] ; Stroke [ ] ; TIA [ ] ; Slurred speech [ ] ;  Neuro: Headaches[ ] ; Vertigo[ ] ; Seizures[ ] ; Paresthesias[ ] ;Blurred vision [ ] ; Diplopia [ ] ; Vision changes [ ]   Ortho/Skin: Arthritis [ ] ; Joint pain [ ] ; Muscle pain [ ] ; Joint swelling [ ] ; Back Pain [ ] ;  Rash [ ]   Psych: Depression[ ] ; Anxiety[ ]   Heme: Bleeding problems [ ] ; Clotting disorders [ ] ; Anemia [ ]   Endocrine: Diabetes [ ] ; Thyroid dysfunction[ ]   Home Medications Prior to Admission medications   Medication Sig Start Date End Date Taking? Authorizing Provider  ACCU-CHEK AVIVA PLUS test strip USE ONE STRIP TO CHECK GLUCOSE THREE TIMES DAILY AS DIRECTED 04/29/16  Yes Elayne Snare, MD  ACCU-CHEK FASTCLIX LANCETS MISC USE ONE  TO CHECK GLUCOSE THREE TIMES DAILY 11/30/15  Yes Elayne Snare, MD  acetaminophen (TYLENOL) 325 MG tablet Take 325 mg by mouth every 4 (four) hours as needed for moderate pain.    Yes Historical Provider,  MD  Albuterol Sulfate (PROAIR RESPICLICK) 673 (90 Base) MCG/ACT AEPB Inhale 2 puffs into the lungs every 6 (six) hours as needed. For wheezing or shortness of breath. 05/24/16  Yes Charlene Brooke Nche, NP  allopurinol (ZYLOPRIM) 100 MG tablet TAKE TWO TABLETS BY MOUTH ONCE DAILY 04/14/16  Yes [name], MD  amLODipine (NORVASC) 5 MG tablet Take 2.5 mg by mouth daily.   Yes Historical Provider, MD  aspirin 81 MG tablet Take 81 mg by mouth once a week. Take on sundays   Yes Historical Provider, MD  atorvastatin (LIPITOR) 10 MG tablet Take 1 tablet (10 mg total) by mouth daily. 10/09/15  Yes [name], MD  carboxymethylcellulose (REFRESH PLUS) 0.5 % SOLN Place 2 drops into both eyes daily.    Yes Historical Provider, MD  carvedilol (COREG) 6.25 MG tablet Take 1 tablet (6.25 mg total) by mouth 2 (two) times daily. 08/31/15  Yes Jettie Booze, MD  Cholecalciferol 1000 units tablet Take 1,000 Units by mouth daily.   Yes Historical Provider, MD  colchicine 0.6 MG tablet Take 0.6 mg by mouth daily as needed (gout).    Yes Historical Provider, MD  furosemide (LASIX) 80 MG tablet Take 80 mg by mouth twice daily. May take additional 40 mg daily as needed for weight gain of 2 lbs or more in 24 hour period. 04/11/16  Yes Jettie Booze, MD  gabapentin (NEURONTIN) 300 MG capsule TAKE ONE CAPSULE BY MOUTH 4 TIMES DAILY AS NEEDED FOR LEG PAIN 12/04/15  Yes Elayne Snare, MD  insulin detemir (LEVEMIR) 100 UNIT/ML injection Inject 10 Units into the skin 2 (two) times daily.    Yes Historical Provider, MD  insulin regular (NOVOLIN R) 100 units/mL injection Inject 20 Units into the skin 2 (two) times daily before a meal.    Yes Historical Provider, MD  ipratropium (ATROVENT) 0.06 % nasal spray Place 2 sprays into both nostrils daily.  01/30/13  Yes Historical Provider, MD  losartan (COZAAR) 50 MG tablet TAKE ONE TABLET BY MOUTH ONCE DAILY 02/05/16  Yes Jettie Booze, MD  methocarbamol (ROBAXIN) 750 MG  tablet Take 750 mg by mouth 2 (two) times daily as needed for muscle spasms.   Yes Historical Provider, MD  nitroGLYCERIN (NITROSTAT) 0.4 MG SL tablet Place 1 tablet (0.4 mg total) under the tongue every 5 (five) minutes as needed for chest pain (MAX 3 TABLETS). 12/15/14  Yes Carlena Bjornstad, MD  omeprazole (PRILOSEC) 20 MG capsule Take 20 mg by mouth 2 (two) times daily before a meal.   Yes Historical Provider, MD  potassium chloride SA (K-DUR,KLOR-CON) 20 MEQ tablet Take 30 mEq by mouth 2 (two) times daily.   Yes Historical Provider, MD  traMADol (ULTRAM) 50 MG tablet Take 100 mg by mouth every 8 (  eight) hours as needed (pain).   Yes Historical Provider, MD  warfarin (COUMADIN) 2.5 MG tablet TAKE ONE TABLET BY MOUTH AS DIRECTED Patient taking differently: takes 1 tablet daily except on wednesdday he takes 2 01/11/16  Yes Jettie Booze, MD    Past Medical History: Past Medical History:  Diagnosis Date  . AAA (abdominal aortic aneurysm) ([place])    Surgical repair 11/2002.  . Adenomatous colon polyp   . Alcohol ingestion of more than four drinks per week    Excess beer  not a dependency problem  . Aortic valve sclerosis   . Arthritis   . Atrial fibrillation ([place])    Previous long-term amiodarone therapy with multiple cardioversions / amiodarone stopped September, 2009  . Atrial flutter [facility])    Started November, 2010, Left-sided and cannot ablate  . Bony abnormality    Patient's manubrium is slightly displaced to the right  . CAD (coronary artery disease)    a. s/p CABG 2004;  b. 02/2016 Cath: LM nl, LAD 70p, 148m, D1 50, D2 50, RI 50ost, LCX 67m, OM2 100, OM3 100, RCA 70p, 100d, VG->OM3 ok, LIMA->LAD 60ost, VG->RPDA  ok.  . 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 01/2013 MDT DTBA 1D1 [place] CRT-D, ser # [id number].  . Carotid artery disease ([place])    Doppler, December, 2013, 0-39% bilateral  . Chronic systolic CHF (congestive heart failure) ([place])    a.  02/2016 Echo: EF 35-40%, diff HK, triv AI, mildly dil Ao root 72mm, mild [name], sev dil LA, triv TR.  Marland Kitchen Chronotropic incompetence    IV pacing rate adjusted  . CKD (chronic kidney disease), stage III   . COPD (chronic obstructive pulmonary disease) ([place])   . Dilated aortic root ([place])   . Discolored skin   . Diverticulosis   . Drug therapy    Redness and swelling with Avelox infusion May 24, 2011  . Eye abnormality    Ophthalmologist questions a clot in one of his eyes, May, 2012  . Gout   . Hyperlipidemia   . Hypertension   . Internal hemorrhoids   . Ischemic cardiomyopathy   . Left atrial thrombus    Remote past... cardioversions done since that time  . Mitral regurgitation    Mild echo  . Myocardial infarction ([place])   . Nasal drainage    Chronic  . Overweight(278.02)   . Pericardial effusion   . Pleural effusion    Large loculated effusion on the left side November, 2011. This was tapped. It was exudative. Cytology revealed no cancer no proof of mesothelioma area pulmonary team felt that no further workup was needed  . Pleural thickening   . Pneumonia   . Polycythemia vera ([place]) 07/28/2014  . SOB (shortness of breath)    Large left effusion/ thoracentesis/hospitalization/November, 2011... exudated.. cytology negative.. Dr.Wert.. no proof of mesothelioma  . Spinal stenosis    Surgery Dr.Elsner  . Thrombophlebitis of superficial veins of upper extremities    Possible venous stenosis from defibrillator  . Type II diabetes mellitus ([place])   . Venous insufficiency    Toe discoloration chronic  . Ventral hernia    April, 2014, result of his abdominal surgery  . Warfarin anticoagulation   . Wide-complex tachycardia ([place])     Past Surgical History: Past Surgical History:  Procedure Laterality Date  . ABDOMINAL AORTIC ANEURYSM REPAIR  11/2002  . ANTERIOR CERVICAL DECOMP/DISCECTOMY FUSION  1995  . BACK SURGERY    .  BI-VENTRICULAR PACEMAKER INSERTION (CRT-P)  02-11-2013   Pt  with previously implanted MDT CRTD downgraded to CRTP by Dr Lovena Le 02-11-13  . CARDIAC CATHETERIZATION  "several"  . CARDIAC CATHETERIZATION N/A 03/18/2016   Procedure: RIGHT/LEFT HEART CATH AND CORONARY/GRAFT ANGIOGRAPHY;  Surgeon: Nelva Bush, MD;  Location: [facility];  Service: Cardiovascular;  Laterality: N/A;  . CATARACT EXTRACTION W/ INTRAOCULAR LENS  IMPLANT, BILATERAL    . COLONOSCOPY W/ POLYPECTOMY    . CORONARY ANGIOPLASTY WITH STENT PLACEMENT  2004  . CORONARY ARTERY BYPASS GRAFT  2004   CABG X4  . FRACTURE SURGERY    . IMPLANTABLE CARDIOVERTER DEFIBRILLATOR (ICD) GENERATOR CHANGE N/A 02/11/2013   Procedure: ICD GENERATOR CHANGE;  Surgeon: Evans Lance, MD;  Location: [facility] CATH LAB;  Service: Cardiovascular;  Laterality: N/A;  . INCISION AND DRAINAGE ABSCESS / HEMATOMA OF BURSA / KNEE / THIGH    . INSERT / REPLACE / REMOVE PACEMAKER  2009   original pacer/defibrillator; Dr. Lovena Le 2009... by the pacing  . LUMBAR FUSION  2010  . ORIF TIBIA & FIBULA FRACTURES Right 2000s  . PILONIDAL CYST EXCISION    . SPINAL CORD STIMULATOR IMPLANT  12/2011  . SURGERY SCROTAL / TESTICULAR      Family History: Family History  Problem Relation Age of Onset  . Hypertension Mother   . Stroke Mother   . Diabetes Father   . Coronary artery disease Father   . Other Father     DVT  . Diabetes Brother   . Colon cancer Neg Hx   . Heart attack Neg Hx     Social History: Social History   Social History  . Marital status: Married    Spouse name: N/A  . Number of children: N/A  . Years of education: N/A   Occupational History  . Retired- Nurse, mental health    Social History Main Topics  . Smoking status: Former Smoker    Packs/day: 3.00    Years: 39.00    Types: Cigarettes    Start date: 05/24/1956    Quit date: 03/22/1995  . Smokeless tobacco: Never Used  . Alcohol use 1.2 oz/week    2 Cans of beer per week  . Drug use: No  . Sexual activity: No   Other Topics  Concern  . None   Social History Narrative  . None    Allergies:  Allergies  Allergen Reactions  . Avelox [Moxifloxacin Hcl In Nacl] Swelling, Rash and Other (See Comments)    Patient became hypotensive after infusion started Because of a history of documented adverse serious drug reaction;Medi Alert bracelet  is recommended  . Penicillins Anaphylaxis, Other (See Comments) and Swelling    REACTION: anaphylaxis Because of a history of documented adverse serious drug reaction;Medi Alert bracelet  is recommended    Objective:    Vital Signs:   Temp:  [97.5 F (36.4 C)-98.3 F (36.8 C)] 97.8 F (36.6 C) (04/16 1340) Pulse Rate:  [56-115] 76 (04/16 1340) Resp:  [18-20] 20 (04/16 1340) BP: (106-131)/(69-88) 106/69 (04/16 1340) SpO2:  [87 %-98 %] 96 % (04/16 1340) Weight:  [224 lb 3.2 oz (101.7 kg)] 224 lb 3.2 oz (101.7 kg) (04/16 0530) Last BM Date: 07/03/16  Weight change: Filed Weights   07/02/16 2001 07/03/16 0500 07/04/16 0530  Weight: 230 lb 8 oz (104.6 kg) 226 lb 6.4 oz (102.7 kg) 224 lb 3.2 oz (101.7 kg)    Intake/Output:   Intake/Output Summary (Last 24 hours) at  07/04/16 1346 Last data filed at 07/04/16 1339  Gross per 24 hour  Intake             1060 ml  Output             2225 ml  Net            -1165 ml     Physical Exam: General:  Elderly. Chronically ill. NAD. In bed. Wife at bedside.  HEENT: normal Neck: supple. JVP 8-9. Carotids 2+ bilat; no bruits. No lymphadenopathy or thyromegaly appreciated. Cor: PMI nondisplaced. Irregualr rate & rhythm. No rubs, gallops or murmurs. Lungs: Decreased in the bases on room air.  Abdomen: obese, round, soft, nontender, nondistended. No hepatosplenomegaly. No bruits or masses. Good bowel sounds. Extremities: no cyanosis, clubbing, rash, R and LLE trace-1+ edema Neuro: alert & orientedx3, cranial nerves grossly intact. moves all 4 extremities w/o difficulty. Affect pleasant  Telemetry:  Afib 80s.   Labs: Basic  Metabolic Panel:  Recent Labs Lab 07/02/16 1027 07/03/16 0417 07/04/16 0002 07/04/16 0347  NA 137 140 140 140  K 3.7 3.7 3.2* 3.7  CL 103 102 101 102  CO2 25 29 28 27   GLUCOSE 164* 135* 114* 124*  BUN 33* 31* 36* 39*  CREATININE 1.70* 1.49* 1.60* 1.61*  CALCIUM 9.1 9.0 8.7* 8.7*  MG  --   --  2.4  --     Liver Function Tests:  Recent Labs Lab 07/02/16 1027  AST 18  ALT 14*  ALKPHOS 73  BILITOT 1.2  PROT 6.7  ALBUMIN 3.9   No results for input(s): LIPASE, AMYLASE in the last 168 hours. No results for input(s): AMMONIA in the last 168 hours.  CBC:  Recent Labs Lab 07/02/16 1027  WBC 12.0*  NEUTROABS 9.6*  HGB 15.2  HCT 46.1  MCV 84.9  PLT 197    Cardiac Enzymes:  Recent Labs Lab 07/02/16 1027 07/02/16 1739 07/02/16 2228 07/03/16 0417  TROPONINI 0.13* 0.12* 0.09* 0.11*    BNP: BNP (last 3 results)  Recent Labs  03/15/16 1314 07/02/16 1027  BNP 610.8* 1,583.7*    ProBNP (last 3 results)  Recent Labs  03/30/16 1243 04/11/16 1037  PROBNP 2,275* 2,369*     CBG:  Recent Labs Lab 07/03/16 1138 07/03/16 1628 07/03/16 2134 07/04/16 0724 07/04/16 1130  GLUCAP 111* 154* 138* 118* 171*    Coagulation Studies:  Recent Labs  07/02/16 1307 07/03/16 0417 07/04/16 0347  LABPROT 17.0* 19.0* 20.9*  INR 1.37 1.58 1.77    Other results: EKG: On admit A fib RVR 137 bpm. Imaging:  No results found.   Medications:     Current Medications: . allopurinol  200 mg Oral Daily  . aspirin EC  81 mg Oral Q Sun  . atorvastatin  10 mg Oral Daily  . carvedilol  6.25 mg Oral BID  . [START ON 07/05/2016] digoxin  0.125 mg Oral Daily  . digoxin  0.25 mg Oral Q2H  . enoxaparin (LOVENOX) injection  100 mg Subcutaneous BID  . furosemide  80 mg Oral BID  . insulin aspart  0-15 Units Subcutaneous TID WC  . insulin detemir  10 Units Subcutaneous BID  . ipratropium  2 spray Each Nare Daily  . losartan  50 mg Oral Daily  . magnesium oxide   400 mg Oral Daily  . pantoprazole  40 mg Oral Daily  . potassium chloride SA  30 mEq Oral BID  . sodium chloride flush  3  mL Intravenous Q12H  . warfarin  5 mg Oral ONCE-1800  . Warfarin - Pharmacist Dosing Inpatient   Does not apply q1800     Infusions:    Assessment/Plan    1. A/C Systolic Heart Failure- ICM. ECHO 2017 EF 35-40%. CRT-D medtronic  Volume status ok. Stop lasix and switch to torsemide 60 mg daily and 12.5 mg spironolactone daily. Continue carvedilol 6.25 mg twice a day.  Continue digoxin. Check level in am.  Continue losartan at the current dose. Anticipate switching to entresto soon. BMET in am.  In the future he may benefit from cardiomems.  2. Chronic A fib - RVR on admit.  He has had 4 failed cardioversion's. Amiodarone stopped 2009. He was on diltiazem but it was stopped early today. Continue low dose carvedilol. On chronic coumadin. INR 1.77. Pharmacy dosing coumadin. 3. CAD- on statin. Troponin trend flat 4. CKD Stage III- Creatinine baseline 1.4-1-6.  5. Gout- continue allopurinol.  6. DMII- on insulin- HGb A1C 6.8   Optivol- chronic A -fib. Impedance trending up.   Length of Stay: 2  Darrick Grinder, NP  07/04/2016, 1:46 PM  Advanced Heart Failure Team Pager [phone] (M-F; [place])  Please contact [facility] for night-coverage after hours (4p -7a ) and weekends on [email or web address]  Patient seen with NP, agree with the above note.   1. Acute on chronic systolic CHF: Last echo in 12/17 with EF 35-40%, ischemic cardiomyopathy.  2nd admission for CHF in 5 months.  Reviewing Optivol from his Medtronic device shows fluid index frequently > threshold, suggesting tenuous control of CHF.  Cardiac index 2.1 from [place] in 12/17.  He has been diuresed in the hospital this admission, weight is down.  On exam, he is not significantly volume overloaded.   - Would transition to torsemide, start 60 mg daily tomorrow (stop Lasix after pm dose today).  - Continue current digoxin  and Coreg.  - Add spironolactone 12.5 daily.  - Currently on losartan, will transition to [facility] likely tomorrow if creatinine remains stable (has BP room).  - I think that he would be a good CardioMems candidate with difficult to control CHF.  We will see him as outpatient and get him set up.  2. CAD: s/p CABG.  Most recent cath in 12/17 with patent grafts but 60% ostial LIMA stenosis.  He has not had any chest pain.  For the time being, would manage medically.  - Continue warfarin - Continue statin.  3. Permanent atrial fibrillation: Present for years.  Unlikely to be able to convert to NSR, would not attempt cardioversion or anti-arrhythmic.   - Rate control reasonable currently with digoxin and Coreg.  - Continue warfarin.  4. CKD: Stage 3.  Creatinine fairly stable.  Follow closely with med changes.   Will watch tomorrow with med changes, likely home on Wednesday then followup for possible CardioMems placement.   Loralie Champagne 07/04/2016 3:07 PM

## 2016-07-04 NOTE — Progress Notes (Addendum)
ANTICOAGULATION CONSULT NOTE - Follow Up Consult  Pharmacy Consult:  Coumadin Indication: atrial fibrillation  Allergies  Allergen Reactions  . Avelox [Moxifloxacin Hcl In Nacl] Swelling, Rash and Other (See Comments)    Patient became hypotensive after infusion started Because of a history of documented adverse serious drug reaction;Medi Alert bracelet  is recommended  . Penicillins Anaphylaxis, Other (See Comments) and Swelling    REACTION: anaphylaxis Because of a history of documented adverse serious drug reaction;Medi Alert bracelet  is recommended    Patient Measurements: Height: 6' (182.9 cm) Weight: 224 lb 3.2 oz (101.7 kg) IBW/kg (Calculated) : 77.6  Vital Signs: Temp: 97.5 F (36.4 C) (04/16 0530) Temp Source: Oral (04/16 0530) BP: 126/82 (04/16 0530) Pulse Rate: 59 (04/16 0530)  Labs:  Recent Labs  07/02/16 1027 07/02/16 1307 07/02/16 1739 07/02/16 2228 07/03/16 0417 07/04/16 0002 07/04/16 0347  HGB 15.2  --   --   --   --   --   --   HCT 46.1  --   --   --   --   --   --   PLT 197  --   --   --   --   --   --   LABPROT  --  17.0*  --   --  19.0*  --  20.9*  INR  --  1.37  --   --  1.58  --  1.77  CREATININE 1.70*  --   --   --  1.49* 1.60* 1.61*  TROPONINI 0.13*  --  0.12* 0.09* 0.11*  --   --     Estimated Creatinine Clearance: 48.9 mL/min (A) (by C-G formula based on SCr of 1.61 mg/dL (H)).    Assessment: 33 YOM to continue on Coumadin from PTA for history of AFib (CHADsVASc = 6).  INR currently sub-therapeutic but is trending up toward goal.  No bleeding reported.  Home Coumadin dose:  2.5mg  daily except 5mg  on Wed   Goal of Therapy:  INR 2-3    Plan:  - Repeat Coumadin 5mg  PO today - Daily PT / INR   Nessa Ramaker D. Mina Marble, PharmD, BCPS Pager:  [phone] 07/04/2016, 8:30 AM   =============================   Addendum: - add Lovenox bridge - SCr trending up, but normalized CrCL is appropriate for BID dosing of Lovenox - last CBC on  07/02/16, H/H/plts WNL - weight down to 101.7 kg   Plan: - Lovenox 100mg  SQ Q12H - CBC Q72H while on Lovenox - Monitor renal function closely, s/sx of bleeding     Elinore Shults D. Mina Marble, PharmD, BCPS Pager:  [phone] 07/04/2016, 11:32 AM

## 2016-07-04 NOTE — Progress Notes (Signed)
CM following for DCP; awaiting for PT evals for disposition needs; B Keeya Dyckman RN,MHA,BSN [phone] 

## 2016-07-04 NOTE — Evaluation (Signed)
Physical Therapy Evaluation Patient Details Name: [name] MRN: [id number] DOB: 16-Jul-1940 Today's Date: 07/04/2016   History of Present Illness  Pt adm with acute on chronic heart failure. PMH - CABG, AAA, ICD, afib, arthritis, chronic back pain, back surgery  Clinical Impression  Pt doing well with mobility and no further PT needed.  Ready for dc from PT standpoint.      Follow Up Recommendations No PT follow up    Equipment Recommendations  None recommended by PT    Recommendations for Other Services       Precautions / Restrictions Precautions Precautions: None Restrictions Weight Bearing Restrictions: No      Mobility  Bed Mobility Overal bed mobility: Modified Independent                Transfers Overall transfer level: Modified independent                  Ambulation/Gait Ambulation/Gait assistance: Modified independent (Device/Increase time) Ambulation Distance (Feet): 125 Feet Assistive device: Straight cane Gait Pattern/deviations: Step-through pattern;Decreased stride length;Trunk flexed Gait velocity: decr Gait velocity interpretation: Below normal speed for age/gender General Gait Details: Steady gait with cane. HR to 120's with amb. SpO2 >97%.  Stairs            Wheelchair Mobility    Modified Rankin (Stroke Patients Only)       Balance Overall balance assessment: Modified Independent                                           Pertinent Vitals/Pain Pain Assessment:  (chronic back pain)    Home Living Family/patient expects to be discharged to:: Private residence Living Arrangements: Spouse/significant other Available Help at Discharge: Family Type of Home: House Home Access: Stairs to enter Entrance Stairs-Rails: Right Entrance Stairs-Number of Steps: 2 Home Layout: Able to live on main level with bedroom/bathroom Home Equipment: Cane - single point      Prior Function Level of  Independence: Independent with assistive device(s)         Comments: using cane     Hand Dominance   Dominant Hand: Left    Extremity/Trunk Assessment   Upper Extremity Assessment Upper Extremity Assessment: Overall WFL for tasks assessed    Lower Extremity Assessment Lower Extremity Assessment: Overall WFL for tasks assessed       Communication   Communication: HOH  Cognition Arousal/Alertness: Awake/alert Behavior During Therapy: WFL for tasks assessed/performed Overall Cognitive Status: Within Functional Limits for tasks assessed                                        General Comments      Exercises     Assessment/Plan    PT Assessment Patent does not need any further PT services  PT Problem List         PT Treatment Interventions      PT Goals (Current goals can be found in the Care Plan section)  Acute Rehab PT Goals PT Goal Formulation: All assessment and education complete, DC therapy    Frequency     Barriers to discharge        Co-evaluation               End of Session  Activity Tolerance: Patient tolerated treatment well Patient left: in bed;with call bell/phone within reach   PT Visit Diagnosis: Unsteadiness on feet (R26.81)    Time: 1217-1226 PT Time Calculation (min) (ACUTE ONLY): 9 min   Charges:   PT Evaluation $PT Eval Low Complexity: 1 Procedure     PT G CodesMarland Kitchen        [facility] [place] 07/04/2016, 2:32 PM

## 2016-07-04 NOTE — Progress Notes (Signed)
Progress Note  Patient Name: [name] Date of Encounter: 07/04/2016  Primary Cardiologist: Dr. Irish Lack  Subjective   No chest pain.  Breathing is still not at baseline.  afib with RVR this morning  Inpatient Medications    Scheduled Meds: . allopurinol  200 mg Oral Daily  . amLODipine  2.5 mg Oral Daily  . aspirin EC  81 mg Oral Q Sun  . atorvastatin  10 mg Oral Daily  . carvedilol  6.25 mg Oral BID  . furosemide  80 mg Intravenous BID  . insulin aspart  0-15 Units Subcutaneous TID WC  . insulin detemir  10 Units Subcutaneous BID  . ipratropium  2 spray Each Nare Daily  . losartan  50 mg Oral Daily  . pantoprazole  40 mg Oral Daily  . potassium chloride SA  30 mEq Oral BID  . sodium chloride flush  3 mL Intravenous Q12H  . warfarin  5 mg Oral ONCE-1800  . Warfarin - Pharmacist Dosing Inpatient   Does not apply q1800   Continuous Infusions:  PRN Meds: sodium chloride, acetaminophen, albuterol, gabapentin, methocarbamol, nitroGLYCERIN, ondansetron (ZOFRAN) IV, polyvinyl alcohol, sodium chloride flush, traMADol   Vital Signs    Vitals:   07/04/16 0010 07/04/16 0230 07/04/16 0231 07/04/16 0530  BP: 131/81   126/82  Pulse: (!) 56   (!) 59  Resp: 18   18  Temp: 97.7 F (36.5 C)   97.5 F (36.4 C)  TempSrc: Oral   Oral  SpO2: 94% (!) 87% 97% 98%  Weight:    224 lb 3.2 oz (101.7 kg)  Height:        Intake/Output Summary (Last 24 hours) at 07/04/16 0859 Last data filed at 07/04/16 0833  Gross per 24 hour  Intake              820 ml  Output             2175 ml  Net            -1355 ml   Filed Weights   07/02/16 2001 07/03/16 0500 07/04/16 0530  Weight: 230 lb 8 oz (104.6 kg) 226 lb 6.4 oz (102.7 kg) 224 lb 3.2 oz (101.7 kg)    Telemetry     110-120's atrial fibrillation with RVR. Multiple PVC's and multiform PVC's Episode of 15 beats of V-tach last night- Personally Reviewed  ECG  NA  - Personally Reviewed  Physical Exam   GEN: No acute  distress.   Neck: No JVD Cardiac:irregularly irregular, no murmurs, rubs, or gallops.  Respiratory: Clear to auscultation bilaterally. GI: Soft, nontender, non-distended  MS: No edema; No deformity. Neuro:  Nonfocal  Psych: Normal affect   Labs    Chemistry Recent Labs Lab 07/02/16 1027 07/03/16 0417 07/04/16 0002 07/04/16 0347  NA 137 140 140 140  K 3.7 3.7 3.2* 3.7  CL 103 102 101 102  CO2 25 29 28 27   GLUCOSE 164* 135* 114* 124*  BUN 33* 31* 36* 39*  CREATININE 1.70* 1.49* 1.60* 1.61*  CALCIUM 9.1 9.0 8.7* 8.7*  PROT 6.7  --   --   --   ALBUMIN 3.9  --   --   --   AST 18  --   --   --   ALT 14*  --   --   --   ALKPHOS 73  --   --   --   BILITOT 1.2  --   --   --  GFRNONAA 38* 44* 41* 40*  GFRAA 44* 51* 47* 47*  ANIONGAP 9 9 11 11      Hematology Recent Labs Lab 07/02/16 1027  WBC 12.0*  RBC 5.43  HGB 15.2  HCT 46.1  MCV 84.9  MCH 28.0  MCHC 33.0  RDW 18.9*  PLT 197    Cardiac Enzymes Recent Labs Lab 07/02/16 1027 07/02/16 1739 07/02/16 2228 07/03/16 0417  TROPONINI 0.13* 0.12* 0.09* 0.11*    BNP Recent Labs Lab 07/02/16 1027  BNP 1,583.7*       Radiology    Dg Chest Portable 1 View Result Date: 07/02/2016 Cardiomegaly with pulmonary vascular congestion. Unchanged left retrocardiac opacity/ atelectasis.   Cardiac Studies   Right/left hear cath and coronary/graft angiography 03/18/2016  Conclusions: 1. Severe native coronary artery disease, including diffuse proximal LAD stenosis up to 70% and mid LAD occlusion, 70% mid LCx disease and occlusion of OM2 and OM3, and diffusely diseased mid RCA with chronic total occlusion of the distal RCA. 2. Patent LIMA to LAD supplying small to medium-caliber distal LAD with 60% ostial LIMA stenosis. 3. Patent SVG to distal OM. Haziness in the proximal segment of the vein graft most likely represents a valve in the similar in appearance to the catheterization from 2008. 4. Patent SVG to PDA with  two valves that appear similar to 2008. 5. Mildly elevated left and right heart filling pressures. 6. Mild pulmonary hypertension. 7. Reduced Fick cardiac output/index.  Recommendations: 1. Recommend avoidance of further IV hydration today and begin gentle diuresis tomorrow based on renal function. 2. If patient continues to have dyspnea despite diuresis and optimization of heart failure regimen, he may benefit from FFR/intervention to ostial LIMA. 3. With the exception of LIMA engagement/intervention, would recommend avoidance of left radial approach due to subclavian artery tortuosity and calcification for future cases. 4. Aggressive secondary prevention.  Patient Profile     76 y.o. male with a prior history of CAD status post coronary artery bypass grafting, chronic atrial fibrillation on Coumadin, left-sided atrial flutter, abdominal aortic aneurysm status post surgical repair, ischemic cardiomyopathy/HFrEF status post biventricular AICD, stage III chronic kidney disease, and obesity, who presents to the emergency department with a 10 day history of progressive dyspnea and weight gain.  Assessment & Plan     ACUTE ON CHRONIC SYSTOLIC HF:   --Down 5 liters since admission.   -- Bump in creatinine 1.49 --> 1.61.  Continue IV diuresis but change dose from 80 mg back down to 60 mg  Lasix BID. --12 lb loss since admission.  CAD:  Mildly elevated troponin thought to be demand ischemia.  Cath last Dec.  No further invasive work up at this time in the absence of symptoms.    HTN:  BP is well controlled on the current meds.    HYPERLIPIDEMIA:  Lipids not drawn this admission can follow up as an outpatient.   CHRONIC ATRIAL FIB:  CHA2DS2 - VASc score of 6 -- Currently in a fib with RVR: Discontinued Norvasc PO and started on Diltiazem 30 mg PO  Q 6 hours for better rhythm control. Can consider lowering dosage  Coreg and Losartan if blood pressure is too low.  CKD STAGE III:   Creat  initially  improved with diuresis but is now tranding up.  Plan: To discuss with MD, Diuresing well but kidneys are starting to elevate,recommend switching lasix 80 mg BID to 60 mg BID. Was rate controlled but now back in a.fib with RVR. Discontinued  Norvasc and started Diltiazem 30 mg Q 6 hours daily. If BP starts to become soft can lower (not discontinue) dosages of Coreg or Losartan.  Had run of 15 beat V-tach last night.   Signed, Linus Mako, PA-C  07/04/2016, 8:59 AM     I have seen and examined the patient along with GREENE,TIFFANY G, PA-C.  I have reviewed the chart, notes and new data.  I agree with PA/NP's note.  Key new complaints: fatigue is pronounced today, dyspnea still occurs intermittently Key examination changes: JVP 10 cm, clear lungs, S3 present. HR 100s at rest Key new findings / data: creat stable 1.6. Brief NSVT on monitor. INR 1.7.  PLAN: CHF consult. Stop negative inotrope diltiazem.  Switch to PO diuretics. Low CO discourages higher dose of carvedilol at this point. Start low dose digoxin for better rate control. Keep K >4. Mg >2. Enoxaparin until INR>2. Keep coumadin clinic appt sheduled for thia Friday.  Sanda Klein, MD, [place] [phone] 07/04/2016, 11:36 AM

## 2016-07-05 ENCOUNTER — Ambulatory Visit: Payer: Self-pay | Admitting: Internal Medicine

## 2016-07-05 LAB — BASIC METABOLIC PANEL
ANION GAP: 11 (ref 5–15)
BUN: 40 mg/dL — ABNORMAL HIGH (ref 6–20)
CALCIUM: 8.8 mg/dL — AB (ref 8.9–10.3)
CO2: 23 mmol/L (ref 22–32)
Chloride: 105 mmol/L (ref 101–111)
Creatinine, Ser: 1.46 mg/dL — ABNORMAL HIGH (ref 0.61–1.24)
GFR, EST AFRICAN AMERICAN: 52 mL/min — AB (ref >60)
GFR, EST NON AFRICAN AMERICAN: 45 mL/min — AB (ref >60)
Glucose, Bld: 108 mg/dL — ABNORMAL HIGH (ref 65–99)
POTASSIUM: 3.7 mmol/L (ref 3.5–5.1)
SODIUM: 139 mmol/L (ref 135–145)

## 2016-07-05 LAB — GLUCOSE, CAPILLARY
GLUCOSE-CAPILLARY: 104 mg/dL — AB (ref 65–99)
Glucose-Capillary: 127 mg/dL — ABNORMAL HIGH (ref 65–99)
Glucose-Capillary: 163 mg/dL — ABNORMAL HIGH (ref 65–99)
Glucose-Capillary: 108 mg/dL — ABNORMAL HIGH (ref 65–99)

## 2016-07-05 LAB — PROTIME-INR
INR: 2.3
PROTHROMBIN TIME: 25.7 s — AB (ref 11.4–15.2)

## 2016-07-05 LAB — DIGOXIN LEVEL: DIGOXIN LVL: 0.6 ng/mL — AB (ref 0.8–2.0)

## 2016-07-05 MED ORDER — WARFARIN SODIUM 2.5 MG PO TABS
2.5000 mg | ORAL_TABLET | Freq: Once | ORAL | Status: AC
  Administered 2016-07-05: 2.5 mg via ORAL
  Filled 2016-07-05: qty 1

## 2016-07-05 MED ORDER — SACUBITRIL-VALSARTAN 49-51 MG PO TABS
1.0000 | ORAL_TABLET | Freq: Two times a day (BID) | ORAL | Status: DC
  Administered 2016-07-05 – 2016-07-06 (×3): 1 via ORAL
  Filled 2016-07-05 (×3): qty 1

## 2016-07-05 NOTE — Progress Notes (Signed)
ANTICOAGULATION CONSULT NOTE - Follow Up Consult  Pharmacy Consult:  Coumadin Indication: atrial fibrillation  Allergies  Allergen Reactions  . Avelox [Moxifloxacin Hcl In Nacl] Swelling, Rash and Other (See Comments)    Patient became hypotensive after infusion started Because of a history of documented adverse serious drug reaction;Medi Alert bracelet  is recommended  . Penicillins Anaphylaxis, Other (See Comments) and Swelling    REACTION: anaphylaxis Because of a history of documented adverse serious drug reaction;Medi Alert bracelet  is recommended    Patient Measurements: Height: 6' (182.9 cm) Weight: 223 lb (101.2 kg) IBW/kg (Calculated) : 77.6  Vital Signs: Temp: 98 F (36.7 C) (04/17 0440) Temp Source: Oral (04/17 0440) BP: 140/90 (04/17 0440) Pulse Rate: 88 (04/17 0440)  Labs:  Recent Labs  07/02/16 1027  07/02/16 1739 07/02/16 2228 07/03/16 0417 07/04/16 0002 07/04/16 0347 07/05/16 0358  HGB 15.2  --   --   --   --   --   --   --   HCT 46.1  --   --   --   --   --   --   --   PLT 197  --   --   --   --   --   --   --   LABPROT  --   < >  --   --  19.0*  --  20.9* 25.7*  INR  --   < >  --   --  1.58  --  1.77 2.30  CREATININE 1.70*  --   --   --  1.49* 1.60* 1.61* 1.46*  TROPONINI 0.13*  --  0.12* 0.09* 0.11*  --   --   --   < > = values in this interval not displayed.  Estimated Creatinine Clearance: 53.8 mL/min (A) (by C-G formula based on SCr of 1.46 mg/dL (H)).    Assessment: 37 YOM to continue on Coumadin from PTA for history of AFib (CHADsVASc = 6).  Lovenox bridge added yesterday given sub-therapeutic INR.  INR increased to therapeutic level today; no bleeding reported.  Home Coumadin dose:  2.5mg  daily except 5mg  on Wed   Goal of Therapy:  INR 2-3    Plan:  - Coumadin 2.5mg  PO today - Daily PT / INR - D/C Lovenox 100mg  SQ Q12H as INR is therapeutic   Flynn Gwyn D. Mina Marble, PharmD, BCPS Pager:  [phone] 07/05/2016, 8:22 AM

## 2016-07-05 NOTE — Progress Notes (Signed)
Pt slept well during the night, Vitals stable, no any sign of SOB and distress noted, no any complain of pain, will continue to monitor the patient. 

## 2016-07-05 NOTE — Progress Notes (Signed)
Advanced Heart Failure Rounding Note   Subjective:    Admitted with volume overload. Diuresed with IV lasix. Yesterday transitioned to torsemide and spiro added. Creatinine trending down 1.6>1.4    Overall feeling ok. Remains SOB with exertion.    Objective:   Weight Range:  Vital Signs:   Temp:  [97.5 F (36.4 C)-98 F (36.7 C)] 98 F (36.7 C) (04/17 0440) Pulse Rate:  [72-88] 88 (04/17 0440) Resp:  [20] 20 (04/17 0440) BP: (106-140)/(69-95) 140/90 (04/17 0440) SpO2:  [96 %-99 %] 98 % (04/17 0440) Weight:  [223 lb (101.2 kg)] 223 lb (101.2 kg) (04/17 0440) Last BM Date: 07/04/16  Weight change: Filed Weights   07/03/16 0500 07/04/16 0530 07/05/16 0440  Weight: 226 lb 6.4 oz (102.7 kg) 224 lb 3.2 oz (101.7 kg) 223 lb (101.2 kg)    Intake/Output:   Intake/Output Summary (Last 24 hours) at 07/05/16 0921 Last data filed at 07/05/16 0500  Gross per 24 hour  Intake              240 ml  Output             1850 ml  Net            -1610 ml     Physical Exam: General:  Well appearing. No resp difficulty. Sitting on the side of the bed.  HEENT: normal Neck: supple. JVP 6-7. Carotids 2+ bilat; no bruits. No lymphadenopathy or thryomegaly appreciated. Cor: PMI nondisplaced. Irregularr rate & rhythm. No rubs, gallops or murmurs. Lungs: clear Abdomen: soft, nontender, nondistended. No hepatosplenomegaly. No bruits or masses. Good bowel sounds. Extremities: no cyanosis, clubbing, rash, edema Neuro: alert & orientedx3, cranial nerves grossly intact. moves all 4 extremities w/o difficulty. Affect pleasant  Telemetry: A fib with an episode of RVR to 130s but mainly controlled rate with PVCs. Personally reviewed.   Labs: Basic Metabolic Panel:  Recent Labs Lab 07/02/16 1027 07/03/16 0417 07/04/16 0002 07/04/16 0347 07/05/16 0358  NA 137 140 140 140 139  K 3.7 3.7 3.2* 3.7 3.7  CL 103 102 101 102 105  CO2 25 29 28 27 23   GLUCOSE 164* 135* 114* 124* 108*  BUN 33*  31* 36* 39* 40*  CREATININE 1.70* 1.49* 1.60* 1.61* 1.46*  CALCIUM 9.1 9.0 8.7* 8.7* 8.8*  MG  --   --  2.4  --   --     Liver Function Tests:  Recent Labs Lab 07/02/16 1027  AST 18  ALT 14*  ALKPHOS 73  BILITOT 1.2  PROT 6.7  ALBUMIN 3.9   No results for input(s): LIPASE, AMYLASE in the last 168 hours. No results for input(s): AMMONIA in the last 168 hours.  CBC:  Recent Labs Lab 07/02/16 1027  WBC 12.0*  NEUTROABS 9.6*  HGB 15.2  HCT 46.1  MCV 84.9  PLT 197    Cardiac Enzymes:  Recent Labs Lab 07/02/16 1027 07/02/16 1739 07/02/16 2228 07/03/16 0417  TROPONINI 0.13* 0.12* 0.09* 0.11*    BNP: BNP (last 3 results)  Recent Labs  03/15/16 1314 07/02/16 1027  BNP 610.8* 1,583.7*    ProBNP (last 3 results)  Recent Labs  03/30/16 1243 04/11/16 1037  PROBNP 2,275* 2,369*      Other results:  Imaging:  No results found.   Medications:     Scheduled Medications: . allopurinol  200 mg Oral Daily  . atorvastatin  10 mg Oral Daily  . carvedilol  6.25 mg Oral BID  .  digoxin  0.125 mg Oral Daily  . insulin aspart  0-15 Units Subcutaneous TID WC  . insulin detemir  10 Units Subcutaneous BID  . ipratropium  2 spray Each Nare Daily  . ketotifen  1 drop Both Eyes BID  . losartan  50 mg Oral Daily  . magnesium oxide  400 mg Oral Daily  . pantoprazole  40 mg Oral Daily  . potassium chloride SA  30 mEq Oral BID  . sodium chloride flush  3 mL Intravenous Q12H  . spironolactone  12.5 mg Oral Daily  . torsemide  60 mg Oral Daily  . warfarin  2.5 mg Oral ONCE-1800  . Warfarin - Pharmacist Dosing Inpatient   Does not apply q1800     Infusions: . sodium chloride       PRN Medications:  sodium chloride, acetaminophen, albuterol, gabapentin, methocarbamol, nitroGLYCERIN, ondansetron (ZOFRAN) IV, polyvinyl alcohol, sodium chloride flush, traMADol   Assessment/Plan/Discussion:    1. A/C Systolic Heart Failure- ECHO EF 35-40% NYHA IIIb.  Volume status stable. Continue torsemide 60 mg daily + 12.5 mg spiro daily. Increase carvedilol to 9.375 mg twice a day  Stop losartan. Add 49-51 mg entresto twice a day.  Anticipate cardiomems as an outpatient. I have provided him with the cardiomems booklet  BMET in am  2. Chronic A fib RVR on admit  Increase bb as above. On coumadin. INR 2.3 Pharmacy dosing.  3. CAD- No CP. Continue statin. On coumadin so no aspirin 4. CKD Stage III- Creatinine trending down.  5. Gout- Stable  6. DMI II- ok. Continue insulin.   Anticipate D/C in am. Hospital follow up set up. Consult CM for entresto.   Length of Stay: 3   Amy Clegg NP-C  07/05/2016, 9:21 AM  Advanced Heart Failure Team Pager [phone] (M-F; 7a - 4p)  Please contact [facility] for night-coverage after hours (4p -7a ) and weekends on [email or web address]  Patient seen with NP, agree with the above note.    1. Acute on chronic systolic CHF: Last echo in 12/17 with EF 35-40%, ischemic cardiomyopathy.  2nd admission for CHF in 5 months.  Reviewing Optivol from his [employer] device shows fluid index frequently > threshold, suggesting tenuous control of CHF.  Cardiac index 2.1 from [place] in 12/17.  He has been diuresed in the hospital this admission, weight is down.  On exam, volume looks ok.  - Started torsemide 60 mg daily today.  - Continue current digoxin - Increase Coreg to 9.375 mg bid.  - Continue spironolactone 12.5 daily.  - Transition to [place] today as above.  - I think that he would be a good CardioMems candidate with difficult to control CHF.  We will see him as outpatient and get him set up. Discussed again today.  2. CAD: s/p CABG.  Most recent cath in 12/17 with patent grafts but 60% ostial LIMA stenosis.  He has not had any chest pain.  For the time being, would manage medically.  - Continue warfarin - Continue statin.  3. Permanent atrial fibrillation: Present for years.  Unlikely to be able to convert to NSR, would not  attempt cardioversion or anti-arrhythmic.   - Rate control reasonable currently with digoxin and Coreg.  - Continue warfarin.  4. CKD: Stage 3.  Creatinine lower today.   Loralie Champagne 07/05/2016 12:20 PM

## 2016-07-06 ENCOUNTER — Encounter: Payer: Self-pay | Admitting: Vascular Surgery

## 2016-07-06 LAB — BASIC METABOLIC PANEL
ANION GAP: 13 (ref 5–15)
BUN: 35 mg/dL — AB (ref 6–20)
CALCIUM: 8.9 mg/dL (ref 8.9–10.3)
CO2: 24 mmol/L (ref 22–32)
Chloride: 104 mmol/L (ref 101–111)
Creatinine, Ser: 1.58 mg/dL — ABNORMAL HIGH (ref 0.61–1.24)
GFR calc Af Amer: 48 mL/min — ABNORMAL LOW (ref >60)
GFR calc non Af Amer: 41 mL/min — ABNORMAL LOW (ref >60)
GLUCOSE: 135 mg/dL — AB (ref 65–99)
POTASSIUM: 3.7 mmol/L (ref 3.5–5.1)
Sodium: 141 mmol/L (ref 135–145)

## 2016-07-06 LAB — PROTIME-INR
INR: 2.31
Prothrombin Time: 25.8 seconds — ABNORMAL HIGH (ref 11.4–15.2)

## 2016-07-06 LAB — CBC
HEMATOCRIT: 45.3 % (ref 39.0–52.0)
Hemoglobin: 14.9 g/dL (ref 13.0–17.0)
MCH: 27.5 pg (ref 26.0–34.0)
MCHC: 32.9 g/dL (ref 30.0–36.0)
MCV: 83.7 fL (ref 78.0–100.0)
Platelets: 175 10*3/uL (ref 150–400)
RBC: 5.41 MIL/uL (ref 4.22–5.81)
RDW: 18.6 % — ABNORMAL HIGH (ref 11.5–15.5)
WBC: 7.9 10*3/uL (ref 4.0–10.5)

## 2016-07-06 LAB — GLUCOSE, CAPILLARY
Glucose-Capillary: 125 mg/dL — ABNORMAL HIGH (ref 65–99)
Glucose-Capillary: 118 mg/dL — ABNORMAL HIGH (ref 65–99)

## 2016-07-06 MED ORDER — CARVEDILOL 6.25 MG PO TABS
9.3750 mg | ORAL_TABLET | Freq: Two times a day (BID) | ORAL | Status: DC
  Administered 2016-07-06: 11:00:00 9.375 mg via ORAL
  Filled 2016-07-06: qty 1

## 2016-07-06 MED ORDER — SPIRONOLACTONE 25 MG PO TABS: 25.0000 mg | ORAL_TABLET | Freq: Every day | ORAL | 6 refills | Status: DC

## 2016-07-06 MED ORDER — POTASSIUM CHLORIDE CRYS ER 20 MEQ PO TBCR: 30.0000 meq | EXTENDED_RELEASE_TABLET | Freq: Every day | ORAL | 6 refills | Status: DC

## 2016-07-06 MED ORDER — DIGOXIN 125 MCG PO TABS: 0.1250 mg | ORAL_TABLET | Freq: Every day | ORAL | 6 refills | Status: DC

## 2016-07-06 MED ORDER — TORSEMIDE 20 MG PO TABS: 60.0000 mg | ORAL_TABLET | Freq: Every day | ORAL | 6 refills | Status: DC

## 2016-07-06 MED ORDER — SACUBITRIL-VALSARTAN 49-51 MG PO TABS: 1.0000 | ORAL_TABLET | Freq: Two times a day (BID) | ORAL | 6 refills | Status: DC

## 2016-07-06 MED ORDER — CARVEDILOL 6.25 MG PO TABS: 9.3750 mg | ORAL_TABLET | Freq: Two times a day (BID) | ORAL | 3 refills | Status: DC

## 2016-07-06 MED ORDER — SPIRONOLACTONE 25 MG PO TABS
25.0000 mg | ORAL_TABLET | Freq: Every day | ORAL | Status: DC
  Administered 2016-07-06: 25 mg via ORAL
  Filled 2016-07-06: qty 1

## 2016-07-06 NOTE — Consult Note (Signed)
   [facility] CM Inpatient Consult   07/06/2016  [name] 09/21/1940 [id number]   Spoke with inpatient prior to visiting [name] at bedside, prior to hospital discharge. Went to bedside to offer and explain [facility] Care Management program with patient. [name] denies having transition of care, care coordination, disease management, disease monitoring, transportation, community resource, or pharmacy needs at this time. Declined [facility] Care Management follow up. Accepted [facility] Care Management brochure with contact information to call in future if changes mind. Will make inpatient RNCM aware that patient declined [place] Management program services.  Marthenia Rolling, MSN-Ed, RN,BSN [facility] Liaison [phone]

## 2016-07-06 NOTE — Progress Notes (Signed)
Patient's wife has arrived to the unit for pt discharge.

## 2016-07-06 NOTE — Progress Notes (Signed)
Call placed to CCMD to notify of telemetry monitoring d/c.   

## 2016-07-06 NOTE — Progress Notes (Addendum)
Advanced Heart Failure Rounding Note   Subjective:    Admitted with volume overload. Diuresed with IV lasix.   Yesterday entresto added. Creatinine up a little 1.4>1.5    Denies SOB. Wants to go home.     Objective:   Weight Range:  Vital Signs:   Temp:  [97.4 F (36.3 C)-97.8 F (36.6 C)] 97.5 F (36.4 C) (04/18 0544) Pulse Rate:  [73-80] 73 (04/18 0544) Resp:  [18-20] 20 (04/18 0544) BP: (112-140)/(79-81) 140/79 (04/18 0544) SpO2:  [91 %-100 %] 100 % (04/18 0544) Weight:  [220 lb 11.2 oz (100.1 kg)] 220 lb 11.2 oz (100.1 kg) (04/18 0544) Last BM Date: 07/06/16  Weight change: Filed Weights   07/04/16 0530 07/05/16 0440 07/06/16 0544  Weight: 224 lb 3.2 oz (101.7 kg) 223 lb (101.2 kg) 220 lb 11.2 oz (100.1 kg)    Intake/Output:   Intake/Output Summary (Last 24 hours) at 07/06/16 0749 Last data filed at 07/06/16 0744  Gross per 24 hour  Intake              240 ml  Output             2950 ml  Net            -2710 ml     Physical Exam: General:  Well appearing. No resp difficulty. Sitting on the side of the bed.  HEENT: normal Neck: supple. JVP 6-7  Carotids 2+ bilat; no bruits. No lymphadenopathy or thryomegaly appreciated. Cor: PMI nondisplaced. Irregular rate & rhythm. No rubs, gallops or murmurs. Lungs: clear Abdomen: soft, nontender, nondistended. No hepatosplenomegaly. No bruits or masses. Good bowel sounds. Extremities: no cyanosis, clubbing, rash, edema Neuro: alert & orientedx3, cranial nerves grossly intact. moves all 4 extremities w/o difficulty. Affect pleasant    Telemetry:  A fib 70-90s  Personally reviewed.    Labs: Basic Metabolic Panel:  Recent Labs Lab 07/03/16 0417 07/04/16 0002 07/04/16 0347 07/05/16 0358 07/06/16 0402  NA 140 140 140 139 141  K 3.7 3.2* 3.7 3.7 3.7  CL 102 101 102 105 104  CO2 29 28 27 23 24   GLUCOSE 135* 114* 124* 108* 135*  BUN 31* 36* 39* 40* 35*  CREATININE 1.49* 1.60* 1.61* 1.46* 1.58*  CALCIUM  9.0 8.7* 8.7* 8.8* 8.9  MG  --  2.4  --   --   --     Liver Function Tests:  Recent Labs Lab 07/02/16 1027  AST 18  ALT 14*  ALKPHOS 73  BILITOT 1.2  PROT 6.7  ALBUMIN 3.9   No results for input(s): LIPASE, AMYLASE in the last 168 hours. No results for input(s): AMMONIA in the last 168 hours.  CBC:  Recent Labs Lab 07/02/16 1027 07/06/16 0402  WBC 12.0* 7.9  NEUTROABS 9.6*  --   HGB 15.2 14.9  HCT 46.1 45.3  MCV 84.9 83.7  PLT 197 175    Cardiac Enzymes:  Recent Labs Lab 07/02/16 1027 07/02/16 1739 07/02/16 2228 07/03/16 0417  TROPONINI 0.13* 0.12* 0.09* 0.11*    BNP: BNP (last 3 results)  Recent Labs  03/15/16 1314 07/02/16 1027  BNP 610.8* 1,583.7*    ProBNP (last 3 results)  Recent Labs  03/30/16 1243 04/11/16 1037  PROBNP 2,275* 2,369*      Other results:  Imaging: No results found.   Medications:     Scheduled Medications: . allopurinol  200 mg Oral Daily  . atorvastatin  10 mg Oral Daily  .  carvedilol  6.25 mg Oral BID  . digoxin  0.125 mg Oral Daily  . insulin aspart  0-15 Units Subcutaneous TID WC  . insulin detemir  10 Units Subcutaneous BID  . ipratropium  2 spray Each Nare Daily  . ketotifen  1 drop Both Eyes BID  . magnesium oxide  400 mg Oral Daily  . pantoprazole  40 mg Oral Daily  . potassium chloride SA  30 mEq Oral BID  . sacubitril-valsartan  1 tablet Oral BID  . sodium chloride flush  3 mL Intravenous Q12H  . spironolactone  12.5 mg Oral Daily  . torsemide  60 mg Oral Daily  . Warfarin - Pharmacist Dosing Inpatient   Does not apply q1800    Infusions: . sodium chloride      PRN Medications: sodium chloride, acetaminophen, albuterol, gabapentin, methocarbamol, nitroGLYCERIN, ondansetron (ZOFRAN) IV, polyvinyl alcohol, sodium chloride flush, traMADol   Assessment/Plan/Discussion:    1. A/C Systolic Heart Failure- ECHO EF 35-40% NYHA IIIb. Volume status stable. Continue torsemide 60 mg daily.  Increase spiro to 25 mg daily.  + 12.5 mg spiro daily. Increase carvedilol to 9.375 mg twice a day  Continue 49-51 mg entresto twice a day.  Anticipate cardiomems as an outpatient. I have provided him with the cardiomems booklet  BMET in am  2. Chronic A fib RVR on admit  Cotninue current dose bb. On coumadin. INR 2.3 Pharmacy dosing.  3. CAD- No CP. Continue statin. On coumadin so no aspirin 4. CKD Stage III- Creatinine stable 1.5  5. Gout- Stable  6. DMI II- ok. Continue insulin.   Home today.   Length of Stay: 4   Amy Clegg NP-C  07/06/2016, 7:49 AM  Advanced Heart Failure Team Pager [phone] (M-F; 7a - 4p)  Please contact [facility] for night-coverage after hours (4p -7a ) and weekends on [email or web address]  Patient seen with NP, agree with the above note.  Weight down today.  Breathing better.  Tolerating current med regimen. Atrial fibrillation rate controlled.   Will let him go home today, followup CHF clinic in 10 days. Followup coumadin clinic.  BMET + digoxin level next Monday or Tuesday.   Cardiac meds: Entresto 49/51 bid, Coreg 9.375 bid, spironolactone 25 daily, digoxin 0.125 daily, KCl 30 daily, torsemide 60 daily, atorvastatin 10 daily, warfarin.   Loralie Champagne 07/06/2016 8:13 AM

## 2016-07-06 NOTE — Discharge Summary (Signed)
Advanced Heart Failure Team  Discharge Summary   Patient ID: [name] MRN: [id number], DOB/AGE: January 12, 1941 76 y.o. Admit date: 07/02/2016 D/C date:     07/06/2016   Primary Discharge Diagnoses:  1. A/C Systolic Heart Failure 2. A Fib RVR, underlying chronic A fib On coumadin  3. CAD 4. CKD Stage III 5. Gout  6. DMII  Hospital Course:   [name] is 76 year old with a history of CAD, CABG, chronic A fib multiple cardioversion's, S/P AAA, ICM, chronic systolic heart failure, [employer] CRT-D, CKD Stage III, DMII, and obesity.  Admitted 4/14 with increased dyspnea and weight gain in the setting of high salt diet. CXR with vascular congestion. On admit he was in A fib RVR and placed on diltiazem drip that was stopped due to negative inotrope effect. Heart rate was better controlled with increased bb. Diuresed with IV lasix and transioned to torsemide 60 mg daily. Overall diuresed  12 pounds. HF meds adjusted to guideline directed therapy.  Plan to follow closely in the HF clinic with follow up next week. Anticipate placing cardiomems in a few weeks.   1. A/C Systolic Heart Failure- ECHO EF 35-40% NYHA IIIb.  Diuresed with IV lasix and transitioned to torsemide 60 mg daily.  Started on entresto and spiro with stable renal function.  Anticipate cardiomems as an outpatient.  2. Chronic A fib RVR on admit  Was on short term diltiazem but stopped with negative inotrope effect. HR controlled with BB. INR remains therapeutic. Follow INR at the Coumadin clinic.  3. CAD- No CP. Continue statin. On coumadin so no aspirin 4. CKD Stage III- Creatinine stable 1.5. Renal function was followed closely  5. Gout- Stable  6. DMI II- ok. Continue insulin.   Discharge Weight: 220 pounds   Discharge Vitals: Blood pressure 140/79, pulse 73, temperature 97.5 F (36.4 C), temperature source Oral, resp. rate 20, height 6' (1.829 m), weight 220 lb 11.2 oz (100.1 kg), SpO2 100 %.  Labs: Lab Results   Component Value Date   WBC 7.9 07/06/2016   HGB 14.9 07/06/2016   HCT 45.3 07/06/2016   MCV 83.7 07/06/2016   PLT 175 07/06/2016    Recent Labs Lab 07/02/16 1027  07/06/16 0402  NA 137  < > 141  K 3.7  < > 3.7  CL 103  < > 104  CO2 25  < > 24  BUN 33*  < > 35*  CREATININE 1.70*  < > 1.58*  CALCIUM 9.1  < > 8.9  PROT 6.7  --   --   BILITOT 1.2  --   --   ALKPHOS 73  --   --   ALT 14*  --   --   AST 18  --   --   GLUCOSE 164*  < > 135*  < > = values in this interval not displayed. Lab Results  Component Value Date   CHOL 124 03/16/2016   HDL 41 03/16/2016   LDLCALC 64 03/16/2016   TRIG 95 03/16/2016   BNP (last 3 results)  Recent Labs  03/15/16 1314 07/02/16 1027  BNP 610.8* 1,583.7*    ProBNP (last 3 results)  Recent Labs  03/30/16 1243 04/11/16 1037  PROBNP 2,275* 2,369*     Diagnostic Studies/Procedures   No results found.  Discharge Medications   Allergies as of 07/06/2016      Reactions   Avelox [moxifloxacin Hcl In Nacl] Swelling, Rash, Other (See Comments)  Patient became hypotensive after infusion started Because of a history of documented adverse serious drug reaction;Medi Alert bracelet  is recommended   Penicillins Anaphylaxis, Other (See Comments), Swelling   REACTION: anaphylaxis Because of a history of documented adverse serious drug reaction;Medi Alert bracelet  is recommended      Medication List    STOP taking these medications   amLODipine 5 MG tablet Commonly known as:  NORVASC   aspirin 81 MG tablet   furosemide 80 MG tablet Commonly known as:  LASIX   losartan 50 MG tablet Commonly known as:  COZAAR     TAKE these medications   ACCU-CHEK AVIVA PLUS test strip Generic drug:  glucose blood USE ONE STRIP TO CHECK GLUCOSE THREE TIMES DAILY AS DIRECTED   ACCU-CHEK FASTCLIX LANCETS Misc USE ONE  TO CHECK GLUCOSE THREE TIMES DAILY   acetaminophen 325 MG tablet Commonly known as:  TYLENOL Take 325 mg by mouth  every 4 (four) hours as needed for moderate pain.   Albuterol Sulfate 108 (90 Base) MCG/ACT Aepb Commonly known as:  PROAIR RESPICLICK Inhale 2 puffs into the lungs every 6 (six) hours as needed. For wheezing or shortness of breath.   allopurinol 100 MG tablet Commonly known as:  ZYLOPRIM TAKE TWO TABLETS BY MOUTH ONCE DAILY   atorvastatin 10 MG tablet Commonly known as:  LIPITOR Take 1 tablet (10 mg total) by mouth daily.   carboxymethylcellulose 0.5 % Soln Commonly known as:  REFRESH PLUS Place 2 drops into both eyes daily.   carvedilol 6.25 MG tablet Commonly known as:  COREG Take 1.5 tablets (9.375 mg total) by mouth 2 (two) times daily. What changed:  how much to take   Cholecalciferol 1000 units tablet Take 1,000 Units by mouth daily.   colchicine 0.6 MG tablet Take 0.6 mg by mouth daily as needed (gout).   digoxin 0.125 MG tablet Commonly known as:  LANOXIN Take 1 tablet (0.125 mg total) by mouth daily.   gabapentin 300 MG capsule Commonly known as:  NEURONTIN TAKE ONE CAPSULE BY MOUTH 4 TIMES DAILY AS NEEDED FOR LEG PAIN   ipratropium 0.06 % nasal spray Commonly known as:  ATROVENT Place 2 sprays into both nostrils daily.   LEVEMIR 100 UNIT/ML injection Generic drug:  insulin detemir Inject 10 Units into the skin 2 (two) times daily.   methocarbamol 750 MG tablet Commonly known as:  ROBAXIN Take 750 mg by mouth 2 (two) times daily as needed for muscle spasms.   nitroGLYCERIN 0.4 MG SL tablet Commonly known as:  NITROSTAT Place 1 tablet (0.4 mg total) under the tongue every 5 (five) minutes as needed for chest pain (MAX 3 TABLETS).   NOVOLIN R 250 units/2.8mL (100 units/mL) injection Generic drug:  insulin regular Inject 20 Units into the skin 2 (two) times daily before a meal.   omeprazole 20 MG capsule Commonly known as:  PRILOSEC Take 20 mg by mouth 2 (two) times daily before a meal.   potassium chloride SA 20 MEQ tablet Commonly known as:   K-DUR,KLOR-CON Take 1.5 tablets (30 mEq total) by mouth daily. What changed:  when to take this   sacubitril-valsartan 49-51 MG Commonly known as:  ENTRESTO Take 1 tablet by mouth 2 (two) times daily.   spironolactone 25 MG tablet Commonly known as:  ALDACTONE Take 1 tablet (25 mg total) by mouth daily.   torsemide 20 MG tablet Commonly known as:  DEMADEX Take 3 tablets (60 mg total) by mouth daily.  traMADol 50 MG tablet Commonly known as:  ULTRAM Take 100 mg by mouth every 8 (eight) hours as needed (pain).   warfarin 2.5 MG tablet Commonly known as:  COUMADIN TAKE ONE TABLET BY MOUTH AS DIRECTED What changed:  See the new instructions.       Disposition   The patient will be discharged in stable condition to home. Discharge Instructions    Diet - low sodium heart healthy    Complete by:  As directed    Heart Failure patients record your daily weight using the same scale at the same time of day    Complete by:  As directed    Bring all medications to follow up appointment   Increase activity slowly    Complete by:  As directed      Follow-up Information    Darrick Grinder, NP Follow up on 07/11/2016.   Specialty:  Cardiology Why:  at 8:30 Garage Code 6000 Contact information: [address] [address] [phone]             Duration of Discharge Encounter: Greater than 35 minutes   Signed, Amy Clegg NP_C  07/06/2016, 8:42 AM

## 2016-07-06 NOTE — Progress Notes (Signed)
ANTICOAGULATION CONSULT NOTE - Follow Up Consult  Pharmacy Consult:  Coumadin Indication: h/o chronic atrial fibrillation  Allergies  Allergen Reactions  . Avelox [Moxifloxacin Hcl In Nacl] Swelling, Rash and Other (See Comments)    Patient became hypotensive after infusion started Because of a history of documented adverse serious drug reaction;Medi Alert bracelet  is recommended  . Penicillins Anaphylaxis, Other (See Comments) and Swelling    REACTION: anaphylaxis Because of a history of documented adverse serious drug reaction;Medi Alert bracelet  is recommended    Patient Measurements: Height: 6' (182.9 cm) Weight: 220 lb 11.2 oz (100.1 kg) (scale b) IBW/kg (Calculated) : 77.6  Vital Signs: Temp: 97.5 F (36.4 C) (04/18 0544) Temp Source: Oral (04/18 0544) BP: 140/79 (04/18 0544) Pulse Rate: 73 (04/18 0544)  Labs:  Recent Labs  07/04/16 0347 07/05/16 0358 07/06/16 0402  HGB  --   --  14.9  HCT  --   --  45.3  PLT  --   --  175  LABPROT 20.9* 25.7* 25.8*  INR 1.77 2.30 2.31  CREATININE 1.61* 1.46* 1.58*    Estimated Creatinine Clearance: 49.5 mL/min (A) (by C-G formula based on SCr of 1.58 mg/dL (H)).    Assessment: 38 YOM continued on Coumadin from PTA for history of AFib (CHADsVASc = 6).  Today INR= 2.31, therapeutic. CBC wnl, no bleeding noted.  Cardiologist is  discharging pt today 4/18 on his PTA/home dose Coumadin 2.5mg  daily except 5mg  on Wed.    Home Coumadin dose:  2.5mg  daily except 5mg  on Wed   Goal of Therapy:  INR 2-3    Plan: MD is discharging the patient today 4/18 on PTA coumadin dose 2.5 mg daily except 5mg  qWed with continued outpatient INR monitoring as PTA.   Nicole Cella, RPh Clinical Pharmacist 07/06/2016, 9:45 AM

## 2016-07-06 NOTE — Progress Notes (Addendum)
Patient is to be discharged home on [place]; Entresto coupon card given to patient with instructions of usage; pharmacy of choice is [facility]; CM called [facility] and his prescription is ready for pick up; Carlis Stable [phone]

## 2016-07-06 NOTE — Progress Notes (Signed)
Pt is prepped for discharge, requests his AVS/Discharge instructions be reviewed with his wife, versus himself. Wife is unavailable until noon; as she is a patient for OP procedure/studies this am at [facility].

## 2016-07-06 NOTE — Progress Notes (Signed)
Pt slept well during the night, Vitals stable, no any sign of SOB and distress noted, no any complain of pain, will continue to monitor the patient. 

## 2016-07-06 NOTE — Care Management Important Message (Signed)
Important Message  Patient Details  Name: [name] MRN: [id number] Date of Birth: [birth year]   Medicare Important Message Given:  Yes    Sears Oran 07/06/2016, 12:20 PM

## 2016-07-07 ENCOUNTER — Ambulatory Visit: Payer: Medicare Other | Admitting: Endocrinology

## 2016-07-07 ENCOUNTER — Telehealth: Payer: Self-pay | Admitting: *Deleted

## 2016-07-07 NOTE — Telephone Encounter (Signed)
Pt was on TCM list admitted on 4/14 with increased dyspnea and weight gain in the setting of high salt diet. CXR with vascular congestion. On admit he was in A fib RVR and placed on diltiazem drip that was stopped due to negative inotrope effect. D/C 4/18, and will f/u w/cardiology 07/11/16...[name]

## 2016-07-08 ENCOUNTER — Encounter: Payer: Self-pay | Admitting: Endocrinology

## 2016-07-08 ENCOUNTER — Ambulatory Visit (INDEPENDENT_AMBULATORY_CARE_PROVIDER_SITE_OTHER): Payer: Medicare Other | Admitting: Endocrinology

## 2016-07-08 VITALS — BP 128/70 | HR 76 | Ht 72.0 in | Wt 225.0 lb

## 2016-07-08 DIAGNOSIS — Z794 Long term (current) use of insulin: Secondary | ICD-10-CM

## 2016-07-08 DIAGNOSIS — E1165 Type 2 diabetes mellitus with hyperglycemia: Secondary | ICD-10-CM | POA: Diagnosis not present

## 2016-07-08 NOTE — Patient Instructions (Addendum)
Take 5 Regular at supper and same Levemir  Check blood sugars on waking up    Also check blood sugars about 2 hours after a meal and do this after different meals by rotation  Recommended blood sugar levels on waking up is 90-130 and about 2 hours after meal is 130-160  Please bring your blood sugar monitor to each visit, thank you

## 2016-07-08 NOTE — Progress Notes (Signed)
Patient ID: [name], male   DOB: Mar 20, 1941, 76 y.o.   MRN: [id number]    Reason for Appointment : F/u for Type 2 Diabetes  History of Present Illness          Diagnosis: Type 2 diabetes mellitus, date of diagnosis: 2008       Past history: He was initially treated with Metformin and also given Amayl at some point. With this he had fair control of his diabetes with A1c had usually been over 7% except once in 2013.  He was taken off metformin probably in March this year after a hospitalization, possibly because of renal dysfunction He was started on Levemir insulin in May when his blood sugars were significantly higher, fasting readings averaging 220.  Levemir progressively increased and was on 35 units at night since 12/20/12 He has not been on any other medications either orally or injectable for his diabetes Because of his A1c of over 10% in 10/14 on basal insulin alone he was started on NovoLog with each meal in 10/14  Recent history:     INSULIN regimen is described as: Levemir 5 units 2 times a day, Regular Insulin none, previously on 20 units twice a day  He is  on Levemir since he could not afford Toujeo Also  on Regular Insulin instead of Humalog also because of high cost  A1c is now 6.8, previously had been 6%    Current management, blood sugar patterns and problems identified:  He had been hospitalized in April and just got discharge 2 days ago for heart failure  However with last 3 months he has lost a significant amount of weight various reasons including decreased appetite  He had called to report relatively lower sugars about a month ago when he was already reducing his insulin and both his Levemir and Regular Insulin for reduced  Since he was having low normal sugars after meal he has not taken any Regular Insulin  More recently he has started eating a little better and his blood sugars are trending higher both fasting and after supper  Again  generally eating only 2 meals a day  Compliance with the medical regimen: Good   Glucose monitoring:  done  Up to 3  times a day        Glucometer:  Accu-Chek    Blood Glucose readings from download as follows  Mean values apply above for all meters except median for One Touch  PRE-MEAL Fasting Lunch Dinner Bedtime Overall  Glucose range:  97-1 86       Mean/median: 146    129   POST-MEAL PC Breakfast PC Lunch PC Dinner  Glucose range:  96-1 48   10 4-211   Mean/median:  120 148    Self-care: The diet that the patient has been following is: Smaller portions and relatively balanced meals  Meals: 2 meals per day. Noon and 6 pm  Low fat meals At dinnertime. Avoiding drinks with sugar and no juices        Physical activity: exercise: Minimal, limited by back pain And shortness of breath           Dietician visit: Most recent: Unknown.  CDE visit: 11/14                Weight control:    Wt Readings from Last 3 Encounters:  07/08/16 225 lb (102.1 kg)  07/06/16 220 lb 11.2 oz (100.1 kg)  06/10/16 233 lb (105.7  kg)    Lab Results  Component Value Date   HGBA1C 6.8 (H) 07/02/2016   HGBA1C 6.0 04/11/2016   HGBA1C 6.3 (H) 03/15/2016   Lab Results  Component Value Date   MICROALBUR 1.2 10/12/2015   LDLCALC 64 03/16/2016   CREATININE 1.58 (H) 07/06/2016    Other active problems: See review of systems   Allergies as of 07/08/2016      Reactions   Avelox [moxifloxacin Hcl In Nacl] Swelling, Rash, Other (See Comments)   Patient became hypotensive after infusion started Because of a history of documented adverse serious drug reaction;Medi Alert bracelet  is recommended   Penicillins Anaphylaxis, Other (See Comments), Swelling   REACTION: anaphylaxis Because of a history of documented adverse serious drug reaction;Medi Alert bracelet  is recommended      Medication List       Accurate as of 07/08/16 11:59 PM. Always use your most recent med list.          ACCU-CHEK  AVIVA PLUS test strip Generic drug:  glucose blood USE ONE STRIP TO CHECK GLUCOSE THREE TIMES DAILY AS DIRECTED   ACCU-CHEK FASTCLIX LANCETS Misc USE ONE  TO CHECK GLUCOSE THREE TIMES DAILY   acetaminophen 325 MG tablet Commonly known as:  TYLENOL Take 325 mg by mouth every 4 (four) hours as needed for moderate pain.   Albuterol Sulfate 108 (90 Base) MCG/ACT Aepb Commonly known as:  PROAIR RESPICLICK Inhale 2 puffs into the lungs every 6 (six) hours as needed. For wheezing or shortness of breath.   allopurinol 100 MG tablet Commonly known as:  ZYLOPRIM TAKE TWO TABLETS BY MOUTH ONCE DAILY   atorvastatin 10 MG tablet Commonly known as:  LIPITOR Take 1 tablet (10 mg total) by mouth daily.   carboxymethylcellulose 0.5 % Soln Commonly known as:  REFRESH PLUS Place 2 drops into both eyes daily.   carvedilol 6.25 MG tablet Commonly known as:  COREG Take 1.5 tablets (9.375 mg total) by mouth 2 (two) times daily.   Cholecalciferol 1000 units tablet Take 1,000 Units by mouth daily.   colchicine 0.6 MG tablet Take 0.6 mg by mouth daily as needed (gout).   digoxin 0.125 MG tablet Commonly known as:  LANOXIN Take 1 tablet (0.125 mg total) by mouth daily.   gabapentin 300 MG capsule Commonly known as:  NEURONTIN TAKE ONE CAPSULE BY MOUTH 4 TIMES DAILY AS NEEDED FOR LEG PAIN   ipratropium 0.06 % nasal spray Commonly known as:  ATROVENT Place 2 sprays into both nostrils daily.   LEVEMIR 100 UNIT/ML injection Generic drug:  insulin detemir Inject 5 Units into the skin 2 (two) times daily.   methocarbamol 750 MG tablet Commonly known as:  ROBAXIN Take 750 mg by mouth 2 (two) times daily as needed for muscle spasms.   nitroGLYCERIN 0.4 MG SL tablet Commonly known as:  NITROSTAT Place 1 tablet (0.4 mg total) under the tongue every 5 (five) minutes as needed for chest pain (MAX 3 TABLETS).   NOVOLIN R 250 units/2.72mL (100 units/mL) injection Generic drug:  insulin  regular Inject 20 Units into the skin 2 (two) times daily before a meal.   omeprazole 20 MG capsule Commonly known as:  PRILOSEC Take 20 mg by mouth 2 (two) times daily before a meal.   potassium chloride SA 20 MEQ tablet Commonly known as:  K-DUR,KLOR-CON Take 1.5 tablets (30 mEq total) by mouth daily.   sacubitril-valsartan 49-51 MG Commonly known as:  ENTRESTO Take 1 tablet  by mouth 2 (two) times daily.   spironolactone 25 MG tablet Commonly known as:  ALDACTONE Take 1 tablet (25 mg total) by mouth daily.   torsemide 20 MG tablet Commonly known as:  DEMADEX Take 3 tablets (60 mg total) by mouth daily.   traMADol 50 MG tablet Commonly known as:  ULTRAM Take 100 mg by mouth every 8 (eight) hours as needed (pain).   warfarin 2.5 MG tablet Commonly known as:  COUMADIN TAKE ONE TABLET BY MOUTH AS DIRECTED       Allergies:  Allergies  Allergen Reactions  . Avelox [Moxifloxacin Hcl In Nacl] Swelling, Rash and Other (See Comments)    Patient became hypotensive after infusion started Because of a history of documented adverse serious drug reaction;Medi Alert bracelet  is recommended  . Penicillins Anaphylaxis, Other (See Comments) and Swelling    REACTION: anaphylaxis Because of a history of documented adverse serious drug reaction;Medi Alert bracelet  is recommended    Past Medical History:  Diagnosis Date  . AAA (abdominal aortic aneurysm) ([place])    Surgical repair 11/2002.  . Adenomatous colon polyp   . Alcohol ingestion of more than four drinks per week    Excess beer  not a dependency problem  . Aortic valve sclerosis   . Arthritis   . Atrial fibrillation ([place])    Previous long-term amiodarone therapy with multiple cardioversions / amiodarone stopped September, 2009  . Atrial flutter [facility])    Started November, 2010, Left-sided and cannot ablate  . Bony abnormality    Patient's manubrium is slightly displaced to the right  . CAD (coronary artery disease)    a.  s/p CABG 2004;  b. 02/2016 Cath: LM nl, LAD 70p, 166m, D1 50, D2 50, RI 50ost, LCX 8m, OM2 100, OM3 100, RCA 70p, 100d, VG->OM3 ok, LIMA->LAD 60ost, VG->RPDA  ok.  . 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 01/2013 MDT DTBA 1D1 [place] [employer] CRT-D, ser # [id number] H.  . Carotid artery disease ([place])    Doppler, December, 2013, 0-39% bilateral  . Chronic systolic CHF (congestive heart failure) ([place])    a. 02/2016 Echo: EF 35-40%, diff HK, triv AI, mildly dil Ao root 79mm, mild MR, sev dil LA, triv TR.  Marland Kitchen Chronotropic incompetence    IV pacing rate adjusted  . CKD (chronic kidney disease), stage III   . COPD (chronic obstructive pulmonary disease) ([place])   . Dilated aortic root ([place])   . Discolored skin   . Diverticulosis   . Drug therapy    Redness and swelling with Avelox infusion May 24, 2011  . Eye abnormality    Ophthalmologist questions a clot in one of his eyes, May, 2012  . Gout   . Hyperlipidemia   . Hypertension   . Internal hemorrhoids   . Ischemic cardiomyopathy   . Left atrial thrombus    Remote past... cardioversions done since that time  . Mitral regurgitation    Mild echo  . Myocardial infarction ([place])   . Nasal drainage    Chronic  . Overweight(278.02)   . Pericardial effusion   . Pleural effusion    Large loculated effusion on the left side November, 2011. This was tapped. It was exudative. Cytology revealed no cancer no proof of mesothelioma area pulmonary team felt that no further workup was needed  . Pleural thickening   . Pneumonia   . Polycythemia vera ([place]) 07/28/2014  . SOB (shortness of breath)  Large left effusion/ thoracentesis/hospitalization/November, 2011... exudated.. cytology negative.. Dr.Wert.. no proof of mesothelioma  . Spinal stenosis    Surgery Dr.Elsner  . Thrombophlebitis of superficial veins of upper extremities    Possible venous stenosis from defibrillator  . Type II diabetes mellitus ([place])   . Venous  insufficiency    Toe discoloration chronic  . Ventral hernia    April, 2014, result of his abdominal surgery  . Warfarin anticoagulation   . Wide-complex tachycardia ([place])     Past Surgical History:  Procedure Laterality Date  . ABDOMINAL AORTIC ANEURYSM REPAIR  11/2002  . ANTERIOR CERVICAL DECOMP/DISCECTOMY FUSION  1995  . BACK SURGERY    . BI-VENTRICULAR PACEMAKER INSERTION (CRT-P)  02-11-2013   Pt with previously implanted MDT CRTD downgraded to CRTP by Dr Lovena Le 02-11-13  . CARDIAC CATHETERIZATION  "several"  . CARDIAC CATHETERIZATION N/A 03/18/2016   Procedure: RIGHT/LEFT HEART CATH AND CORONARY/GRAFT ANGIOGRAPHY;  Surgeon: Nelva Bush, MD;  Location: [place] CV LAB;  Service: Cardiovascular;  Laterality: N/A;  . CATARACT EXTRACTION W/ INTRAOCULAR LENS  IMPLANT, BILATERAL    . COLONOSCOPY W/ POLYPECTOMY    . CORONARY ANGIOPLASTY WITH STENT PLACEMENT  2004  . CORONARY ARTERY BYPASS GRAFT  2004   CABG X4  . FRACTURE SURGERY    . IMPLANTABLE CARDIOVERTER DEFIBRILLATOR (ICD) GENERATOR CHANGE N/A 02/11/2013   Procedure: ICD GENERATOR CHANGE;  Surgeon: Evans Lance, MD;  Location[facility] C[facility] LAB;  Service: Cardiovascular;  Laterality: N/A;  . INCISION AND DRAINAGE ABSCESS / HEMATOMA OF BURSA / KNEE / THIGH    . INSERT / REPLACE / REMOVE PACEMAKER  2009   original pacer/defibrillator; Dr. Lovena Le 2009... by the pacing  . LUMBAR FUSION  2010  . ORIF TIBIA & FIBULA FRACTURES Right 2000s  . PILONIDAL CYST EXCISION    . SPINAL CORD STIMULATOR IMPLANT  12/2011  . SURGERY SCROTAL / TESTICULAR      Family History  Problem Relation Age of Onset  . Hypertension Mother   . Stroke Mother   . Diabetes Father   . Coronary artery disease Father   . Other Father     DVT  . Diabetes Brother   . Colon cancer Neg Hx   . Heart attack Neg Hx     Social History:  reports that he quit smoking about 21 years ago. His smoking use included Cigarettes. He started smoking about 60 years ago.  He has a 117.00 pack-year smoking history. He has never used smokeless tobacco. He reports that he drinks about 1.2 oz of alcohol per week . He reports that he does not use drugs.    Review of Systems       Lipids: Treated with Lipitor And is taking 10 mg, Followed by PCP and cardiologist  LDL is under 70 more recently   Lab Results  Component Value Date   CHOL 124 03/16/2016   HDL 41 03/16/2016   LDLCALC 64 03/16/2016   TRIG 95 03/16/2016   CHOLHDL 3.0 03/16/2016       The blood pressure has been under control with current regimen of Spironolactone, Carvedilol and currently not on amlodipine and losartan  Renal dysfunction: Creatinine has stabilized  Lab Results  Component Value Date   CREATININE 1.58 (H) 07/06/2016   CREATININE 1.46 (H) 07/05/2016   CREATININE 1.61 (H) 07/04/2016   CREATININE 1.60 (H) 07/04/2016   CREATININE 1.49 (H) 07/03/2016   CREATININE 1.70 (H) 07/02/2016  Has known neuropathy: He has had pains and tingling in his feet and lower legs.  Mostly having  Less symptoms in day Takes gabapentin for this and 1 tablet at suppertime and 2 tablet at bedtime    Physical Examination:  BP 128/70   Pulse 76   Ht 6' (1.829 m)   Wt 225 lb (102.1 kg)   BMI 30.52 kg/m      ASSESSMENT/PLAN:  Diabetes type 2 See history of present illness for detailed discussion of his current management, blood sugar patterns and problems identified  His insulin requirement has been quite variable because of intercurrent illness, weight loss, variable appetite and hospitalizations Although his A1c is relatively higher at 6.8 it is adequate for his age and multiple comorbid conditions including CHF  More recently he is taking minimal amount of insulin, mostly Levemir insulin 5 units twice a day He has started to have relatively high readings after supper since his appetite has improved with readings as high as 211 after supper  He will start 5 units of regular  insulin at suppertime for now and may need higher doses of all his insulin doses subsequently  He will call if his blood sugars start going up again  NEUROPATHY:  Symptomatically controlled with improved blood sugar and taking 5 tablets of gabapentin daily in divided doses    Patient Instructions  Take 5 Regular at supper and same Levemir  Check blood sugars on waking up    Also check blood sugars about 2 hours after a meal and do this after different meals by rotation  Recommended blood sugar levels on waking up is 90-130 and about 2 hours after meal is 130-160  Please bring your blood sugar monitor to each visit, thank you     [facility] 07/09/2016, 5:25 PM

## 2016-07-11 ENCOUNTER — Ambulatory Visit (HOSPITAL_COMMUNITY)
Admission: RE | Admit: 2016-07-11 | Discharge: 2016-07-11 | Disposition: A | Discharge status: Home / Self Care | Payer: Medicare Other | Source: Ambulatory Visit | Attending: Internal Medicine | Admitting: Internal Medicine

## 2016-07-11 VITALS — BP 122/64 | HR 80 | Wt 224.5 lb

## 2016-07-11 DIAGNOSIS — I872 Venous insufficiency (chronic) (peripheral): Secondary | ICD-10-CM | POA: Insufficient documentation

## 2016-07-11 DIAGNOSIS — Z833 Family history of diabetes mellitus: Secondary | ICD-10-CM | POA: Insufficient documentation

## 2016-07-11 DIAGNOSIS — I2510 Atherosclerotic heart disease of native coronary artery without angina pectoris: Secondary | ICD-10-CM | POA: Insufficient documentation

## 2016-07-11 DIAGNOSIS — I255 Ischemic cardiomyopathy: Secondary | ICD-10-CM | POA: Insufficient documentation

## 2016-07-11 DIAGNOSIS — I252 Old myocardial infarction: Secondary | ICD-10-CM | POA: Insufficient documentation

## 2016-07-11 DIAGNOSIS — M1990 Unspecified osteoarthritis, unspecified site: Secondary | ICD-10-CM | POA: Insufficient documentation

## 2016-07-11 DIAGNOSIS — Z87891 Personal history of nicotine dependence: Secondary | ICD-10-CM | POA: Insufficient documentation

## 2016-07-11 DIAGNOSIS — I4892 Unspecified atrial flutter: Secondary | ICD-10-CM | POA: Insufficient documentation

## 2016-07-11 DIAGNOSIS — I340 Nonrheumatic mitral (valve) insufficiency: Secondary | ICD-10-CM | POA: Insufficient documentation

## 2016-07-11 DIAGNOSIS — Z8249 Family history of ischemic heart disease and other diseases of the circulatory system: Secondary | ICD-10-CM | POA: Diagnosis not present

## 2016-07-11 DIAGNOSIS — Z794 Long term (current) use of insulin: Secondary | ICD-10-CM | POA: Diagnosis not present

## 2016-07-11 DIAGNOSIS — Z951 Presence of aortocoronary bypass graft: Secondary | ICD-10-CM | POA: Insufficient documentation

## 2016-07-11 DIAGNOSIS — I5022 Chronic systolic (congestive) heart failure: Secondary | ICD-10-CM

## 2016-07-11 DIAGNOSIS — Z95810 Presence of automatic (implantable) cardiac defibrillator: Secondary | ICD-10-CM | POA: Insufficient documentation

## 2016-07-11 DIAGNOSIS — E785 Hyperlipidemia, unspecified: Secondary | ICD-10-CM | POA: Diagnosis not present

## 2016-07-11 DIAGNOSIS — I482 Chronic atrial fibrillation, unspecified: Secondary | ICD-10-CM

## 2016-07-11 DIAGNOSIS — Z8679 Personal history of other diseases of the circulatory system: Secondary | ICD-10-CM | POA: Insufficient documentation

## 2016-07-11 DIAGNOSIS — Z800 Family history of malignant neoplasm of digestive organs: Secondary | ICD-10-CM | POA: Insufficient documentation

## 2016-07-11 DIAGNOSIS — J449 Chronic obstructive pulmonary disease, unspecified: Secondary | ICD-10-CM | POA: Diagnosis not present

## 2016-07-11 DIAGNOSIS — Z9889 Other specified postprocedural states: Secondary | ICD-10-CM | POA: Insufficient documentation

## 2016-07-11 DIAGNOSIS — E1122 Type 2 diabetes mellitus with diabetic chronic kidney disease: Secondary | ICD-10-CM | POA: Diagnosis not present

## 2016-07-11 DIAGNOSIS — M109 Gout, unspecified: Secondary | ICD-10-CM | POA: Diagnosis not present

## 2016-07-11 DIAGNOSIS — I130 Hypertensive heart and chronic kidney disease with heart failure and stage 1 through stage 4 chronic kidney disease, or unspecified chronic kidney disease: Secondary | ICD-10-CM | POA: Insufficient documentation

## 2016-07-11 DIAGNOSIS — Z823 Family history of stroke: Secondary | ICD-10-CM | POA: Insufficient documentation

## 2016-07-11 DIAGNOSIS — Z7901 Long term (current) use of anticoagulants: Secondary | ICD-10-CM | POA: Insufficient documentation

## 2016-07-11 DIAGNOSIS — Z86010 Personal history of colonic polyps: Secondary | ICD-10-CM | POA: Diagnosis not present

## 2016-07-11 DIAGNOSIS — N183 Chronic kidney disease, stage 3 (moderate): Secondary | ICD-10-CM | POA: Insufficient documentation

## 2016-07-11 LAB — BASIC METABOLIC PANEL
Anion gap: 8 (ref 5–15)
BUN: 33 mg/dL — AB (ref 6–20)
CALCIUM: 8.9 mg/dL (ref 8.9–10.3)
CHLORIDE: 105 mmol/L (ref 101–111)
CO2: 25 mmol/L (ref 22–32)
CREATININE: 1.83 mg/dL — AB (ref 0.61–1.24)
GFR calc Af Amer: 40 mL/min — ABNORMAL LOW (ref >60)
GFR calc non Af Amer: 34 mL/min — ABNORMAL LOW (ref >60)
Glucose, Bld: 129 mg/dL — ABNORMAL HIGH (ref 65–99)
Potassium: 4.2 mmol/L (ref 3.5–5.1)
SODIUM: 138 mmol/L (ref 135–145)

## 2016-07-11 LAB — BRAIN NATRIURETIC PEPTIDE: B NATRIURETIC PEPTIDE 5: 1001.8 pg/mL — AB (ref 0.0–100.0)

## 2016-07-11 NOTE — Progress Notes (Signed)
PCP: Primary Cardiologist: Dr Aundra Dubin   HPI: [name] is a 76 year old with history of CAD, CABG, chronic a fib, S/P AAA, ICM, chronic systolic heart failure, ICD Medtronic CRT-D.   Admitted 4/14 through 4/18 with volume overload. Diuresed with IV lasix and transitioned to torsemide 60 mg daily. HF meds adjusted and he was started on entresto. He was consented to start cardiomems approval process. Discharge weight was 220 pounds.    Today he returns for post hospital follow up with his wife.  Overall feeling much better. Ongoing dyspnea with ADLs, showering, and ambulation but says he feels better. Denies PND/Orthopnea. No dizziness. No fever or chills. No bleeding problems. Weight at home 217-218 pounds. Following low diet and limiting fluid intake to < 2 liters per day. Good appetite. Taking all medications.   ECHO 02/2016 EF 35-40%.    ROS: All systems negative except as listed in HPI, PMH and Problem List.  SH:  Social History   Social History  . Marital status: Married    Spouse name: N/A  . Number of children: N/A  . Years of education: N/A   Occupational History  . Retired- Nurse, mental health    Social History Main Topics  . Smoking status: Former Smoker    Packs/day: 3.00    Years: 39.00    Types: Cigarettes    Start date: 05/24/1956    Quit date: 03/22/1995  . Smokeless tobacco: Never Used  . Alcohol use 1.2 oz/week    2 Cans of beer per week  . Drug use: No  . Sexual activity: No   Other Topics Concern  . Not on file   Social History Narrative  . No narrative on file    FH:  Family History  Problem Relation Age of Onset  . Hypertension Mother   . Stroke Mother   . Diabetes Father   . Coronary artery disease Father   . Other Father     DVT  . Diabetes Brother   . Colon cancer Neg Hx   . Heart attack Neg Hx     Past Medical History:  Diagnosis Date  . AAA (abdominal aortic aneurysm) ([place])    Surgical repair 11/2002.  . Adenomatous colon polyp    . Alcohol ingestion of more than four drinks per week    Excess beer  not a dependency problem  . Aortic valve sclerosis   . Arthritis   . Atrial fibrillation ([place])    Previous long-term amiodarone therapy with multiple cardioversions / amiodarone stopped September, 2009  . Atrial flutter [facility])    Started November, 2010, Left-sided and cannot ablate  . Bony abnormality    Patient's manubrium is slightly displaced to the right  . CAD (coronary artery disease)    a. s/p CABG 2004;  b. 02/2016 Cath: LM nl, LAD 70p, 139m, D1 50, D2 50, RI 50ost, LCX 22m, OM2 100, OM3 100, RCA 70p, 100d, VG->OM3 ok, LIMA->LAD 60ost, VG->RPDA  ok.  . 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 01/2013 MDT DTBA 1D1 [place] CRT-D, ser # [id number] H.  . Carotid artery disease ([place])    Doppler, December, 2013, 0-39% bilateral  . Chronic systolic CHF (congestive heart failure) ([place])    a. 02/2016 Echo: EF 35-40%, diff HK, triv AI, mildly dil Ao root 8mm, mild MR, sev dil LA, triv TR.  Marland Kitchen Chronotropic incompetence    IV pacing rate adjusted  . CKD (chronic kidney disease), stage  III   . COPD (chronic obstructive pulmonary disease) ([place])   . Dilated aortic root ([place])   . Discolored skin   . Diverticulosis   . Drug therapy    Redness and swelling with Avelox infusion May 24, 2011  . Eye abnormality    Ophthalmologist questions a clot in one of his eyes, May, 2012  . Gout   . Hyperlipidemia   . Hypertension   . Internal hemorrhoids   . Ischemic cardiomyopathy   . Left atrial thrombus    Remote past... cardioversions done since that time  . Mitral regurgitation    Mild echo  . Myocardial infarction ([place] [address])   . Nasal drainage    Chronic  . Overweight(278.02)   . Pericardial effusion   . Pleural effusion    Large loculated effusion on the left side November, 2011. This was tapped. It was exudative. Cytology revealed no cancer no proof of mesothelioma area pulmonary team felt that  no further workup was needed  . Pleural thickening   . Pneumonia   . Polycythemia vera ([place]) 07/28/2014  . SOB (shortness of breath)    Large left effusion/ thoracentesis/hospitalization/November, 2011... exudated.. cytology negative.. Dr.Wert.. no proof of mesothelioma  . Spinal stenosis    Surgery Dr.Elsner  . Thrombophlebitis of superficial veins of upper extremities    Possible venous stenosis from defibrillator  . Type II diabetes mellitus ([place])   . Venous insufficiency    Toe discoloration chronic  . Ventral hernia    April, 2014, result of his abdominal surgery  . Warfarin anticoagulation   . Wide-complex tachycardia ([place])     Current Outpatient Prescriptions  Medication Sig Dispense Refill  . ACCU-CHEK AVIVA PLUS test strip USE ONE STRIP TO CHECK GLUCOSE THREE TIMES DAILY AS DIRECTED 100 each 3  . ACCU-CHEK FASTCLIX LANCETS MISC USE ONE  TO CHECK GLUCOSE THREE TIMES DAILY 102 each 5  . acetaminophen (TYLENOL) 325 MG tablet Take 325 mg by mouth every 4 (four) hours as needed for moderate pain.     . Albuterol Sulfate (PROAIR RESPICLICK) 790 (90 Base) MCG/ACT AEPB Inhale 2 puffs into the lungs every 6 (six) hours as needed. For wheezing or shortness of breath. 1 each 0  . allopurinol (ZYLOPRIM) 100 MG tablet TAKE TWO TABLETS BY MOUTH ONCE DAILY 180 tablet 3  . atorvastatin (LIPITOR) 10 MG tablet Take 1 tablet (10 mg total) by mouth daily. 90 tablet 3  . carboxymethylcellulose (REFRESH PLUS) 0.5 % SOLN Place 2 drops into both eyes daily.     . carvedilol (COREG) 6.25 MG tablet Take 1.5 tablets (9.375 mg total) by mouth 2 (two) times daily. 180 tablet 3  . Cholecalciferol 1000 units tablet Take 1,000 Units by mouth daily.    . colchicine 0.6 MG tablet Take 0.6 mg by mouth daily as needed (gout).     Marland Kitchen digoxin (LANOXIN) 0.125 MG tablet Take 1 tablet (0.125 mg total) by mouth daily. 30 tablet 6  . gabapentin (NEURONTIN) 300 MG capsule TAKE ONE CAPSULE BY MOUTH 4 TIMES DAILY AS NEEDED  FOR LEG PAIN 360 capsule 3  . insulin detemir (LEVEMIR) 100 UNIT/ML injection Inject 5 Units into the skin 2 (two) times daily.     . insulin regular (NOVOLIN R) 100 units/mL injection Inject 20 Units into the skin 2 (two) times daily before a meal.     . ipratropium (ATROVENT) 0.06 % nasal spray Place 2 sprays into both nostrils daily.     Marland Kitchen  methocarbamol (ROBAXIN) 750 MG tablet Take 750 mg by mouth 2 (two) times daily as needed for muscle spasms.    . nitroGLYCERIN (NITROSTAT) 0.4 MG SL tablet Place 1 tablet (0.4 mg total) under the tongue every 5 (five) minutes as needed for chest pain (MAX 3 TABLETS). 25 tablet 3  . omeprazole (PRILOSEC) 20 MG capsule Take 20 mg by mouth 2 (two) times daily before a meal.    . potassium chloride SA (K-DUR,KLOR-CON) 20 MEQ tablet Take 1.5 tablets (30 mEq total) by mouth daily. 45 tablet 6  . sacubitril-valsartan (ENTRESTO) 49-51 MG Take 1 tablet by mouth 2 (two) times daily. 60 tablet 6  . spironolactone (ALDACTONE) 25 MG tablet Take 1 tablet (25 mg total) by mouth daily. 30 tablet 6  . torsemide (DEMADEX) 20 MG tablet Take 3 tablets (60 mg total) by mouth daily. 90 tablet 6  . traMADol (ULTRAM) 50 MG tablet Take 100 mg by mouth every 8 (eight) hours as needed (pain).    Marland Kitchen warfarin (COUMADIN) 2.5 MG tablet TAKE ONE TABLET BY MOUTH AS DIRECTED (Patient taking differently: takes 1 tablet daily except on wednesdday he takes 2) 120 tablet 1   No current facility-administered medications for this encounter.     Vitals:   07/11/16 0850  BP: 122/64  Pulse: 80  SpO2: 100%  Weight: 224 lb 8 oz (101.8 kg)    PHYSICAL EXAM: General:  Well appearing. No resp difficulty HEENT: normal Neck: supple. JVP flat. Carotids 2+ bilaterally; no bruits. No lymphadenopathy or thryomegaly appreciated. Cor: PMI normal. Irregular rate & rhythm. No rubs, gallops or murmurs. Lungs: clear Abdomen: round, soft, nontender, nondistended. No hepatosplenomegaly. No bruits or masses.  Good bowel sounds. Extremities: no cyanosis, clubbing, rash, R and LLE trace edema.  Neuro: alert & orientedx3, cranial nerves grossly intact. Moves all 4 extremities w/o difficulty. Affect pleasant.   ECG: A fib 69 bpm   ASSESSMENT & PLAN:  1. Chronic Systolic Heart Failure- ICM. ECHO Ef 02/2016 EF 35-40%. Medtronic CRT-D  Fluid well below threshold. Activity < 1 hour per day.  NYHA IIIb. Volume status stable. Continue torsemide 60 mg daily + 25 mg spiro daily.  Continue carvedilol 9.375 mg twice a day + digoxin 0.125 mg daily.  Continue entresto 49-51 mg twice a day.  Check BMET today  Do the following things EVERYDAY: 1) Weigh yourself in the morning before breakfast. Write it down and keep it in a log. 2) Take your medicines as prescribed 3) Eat low salt foods-Limit salt (sodium) to 2000 mg per day.  4) Stay as active as you can everyday 5) Limit all fluids for the day to less than 2 liters 2. Chronic A fib Rate controlled. Continue current dose of bb. Continue coumadin. [facility]. No bleeding problems.  3. CAD No CP. On statin, bb, and coumadin.  4. CKD Stage III BMET today.  5. Gout - stable. Continue allopurinol.   Follow up 4 weeks with Dr Aundra Dubin. Cardiomems application completed.    Amy Clegg NP-C  9:11 AM

## 2016-07-11 NOTE — Patient Instructions (Signed)
Use Entresto 30 day free card.  No changes to medication at this time.  Routine lab work today. Will notify you of abnormal results, otherwise no news is good news!  Follow up with Dr. Aundra Dubin in 4 weeks.  Do the following things EVERYDAY: 1) Weigh yourself in the morning before breakfast. Write it down and keep it in a log. 2) Take your medicines as prescribed 3) Eat low salt foods-Limit salt (sodium) to 2000 mg per day.  4) Stay as active as you can everyday 5) Limit all fluids for the day to less than 2 liters

## 2016-07-13 ENCOUNTER — Ambulatory Visit (INDEPENDENT_AMBULATORY_CARE_PROVIDER_SITE_OTHER): Payer: Medicare Other | Admitting: Vascular Surgery

## 2016-07-13 ENCOUNTER — Ambulatory Visit (HOSPITAL_COMMUNITY)
Admission: RE | Admit: 2016-07-13 | Discharge: 2016-07-13 | Disposition: A | Discharge status: Home / Self Care | Payer: Medicare Other | Source: Ambulatory Visit | Attending: Vascular Surgery | Admitting: Vascular Surgery

## 2016-07-13 ENCOUNTER — Encounter: Payer: Self-pay | Admitting: Vascular Surgery

## 2016-07-13 VITALS — BP 100/66 | HR 64 | Temp 97.0°F | Resp 24 | Ht 72.0 in | Wt 227.3 lb

## 2016-07-13 DIAGNOSIS — L97909 Non-pressure chronic ulcer of unspecified part of unspecified lower leg with unspecified severity: Secondary | ICD-10-CM

## 2016-07-13 DIAGNOSIS — I70299 Other atherosclerosis of native arteries of extremities, unspecified extremity: Secondary | ICD-10-CM

## 2016-07-13 DIAGNOSIS — L97511 Non-pressure chronic ulcer of other part of right foot limited to breakdown of skin: Secondary | ICD-10-CM | POA: Diagnosis not present

## 2016-07-13 DIAGNOSIS — I712 Thoracic aortic aneurysm, without rupture, unspecified: Secondary | ICD-10-CM

## 2016-07-13 DIAGNOSIS — R9439 Abnormal result of other cardiovascular function study: Secondary | ICD-10-CM | POA: Diagnosis not present

## 2016-07-13 DIAGNOSIS — I739 Peripheral vascular disease, unspecified: Secondary | ICD-10-CM | POA: Diagnosis not present

## 2016-07-13 NOTE — Progress Notes (Signed)
Patient name: [name] MRN: [id number] DOB: 22-Sep-1940 Sex: male  REASON FOR CONSULT: Right foot wound. Referred by Dr. Barkley Bruns.  HPI: [name] "[name] Kindred" A [name] is a 76 y.o. male, who developed a foot wound on the right second toe about 4 weeks ago. This was scraped by Dr. Barkley Bruns any subsequent re-developed some cellulitis in his toes which responded nicely to antibiotics. He is sent for peripheral vascular disease evaluation.  He denies any history of claudication or rest pain. He does have some numbness in his calf but I think this is likely related to an old leg injury.  His risk factors for peripheral vascular disease include diabetes, hypertension, hypercholesterolemia, and a remote history of tobacco use. He denies any family history of premature cardiovascular disease.  He's undergone previous coronary revascularization by Dr. Arvid Right. Vein was taken from both legs.  He's undergone previous abdominal aortic aneurysm repair by Dr. Drucie Opitz. This was an aortobifemoral bypass graft.  Past Medical History:  Diagnosis Date  . AAA (abdominal aortic aneurysm) ([place])    Surgical repair 11/2002.  . Adenomatous colon polyp   . Alcohol ingestion of more than four drinks per week    Excess beer  not a dependency problem  . Aortic valve sclerosis   . Arthritis   . Atrial fibrillation ([place])    Previous long-term amiodarone therapy with multiple cardioversions / amiodarone stopped September, 2009  . Atrial flutter [facility])    Started November, 2010, Left-sided and cannot ablate  . Bony abnormality    Patient's manubrium is slightly displaced to the right  . CAD (coronary artery disease)    a. s/p CABG 2004;  b. 02/2016 Cath: LM nl, LAD 70p, 153m, D1 50, D2 50, RI 50ost, LCX 41m, OM2 100, OM3 100, RCA 70p, 100d, VG->OM3 ok, LIMA->LAD 60ost, VG->RPDA  ok.  . 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 01/2013 MDT DTBA 1D1 [place] CRT-D, ser #  [id number].  . Carotid artery disease ([place])    Doppler, December, 2013, 0-39% bilateral  . Chronic systolic CHF (congestive heart failure) ([place])    a. 02/2016 Echo: EF 35-40%, diff HK, triv AI, mildly dil Ao root 71mm, mild MR, sev dil LA, triv TR.  Marland Kitchen Chronotropic incompetence    IV pacing rate adjusted  . CKD (chronic kidney disease), stage III   . COPD (chronic obstructive pulmonary disease) ([place])   . Dilated aortic root ([place])   . Discolored skin   . Diverticulosis   . Drug therapy    Redness and swelling with Avelox infusion May 24, 2011  . Eye abnormality    Ophthalmologist questions a clot in one of his eyes, May, 2012  . Gout   . Hyperlipidemia   . Hypertension   . Internal hemorrhoids   . Ischemic cardiomyopathy   . Left atrial thrombus    Remote past... cardioversions done since that time  . Mitral regurgitation    Mild echo  . Myocardial infarction ([place])   . Nasal drainage    Chronic  . Overweight(278.02)   . Pericardial effusion   . Pleural effusion    Large loculated effusion on the left side November, 2011. This was tapped. It was exudative. Cytology revealed no cancer no proof of mesothelioma area pulmonary team felt that no further workup was needed  . Pleural thickening   . Pneumonia   . Polycythemia vera ([place]) 07/28/2014  . SOB (shortness of breath)  Large left effusion/ thoracentesis/hospitalization/November, 2011... exudated.. cytology negative.. Dr.Wert.. no proof of mesothelioma  . Spinal stenosis    Surgery Dr.Elsner  . Thrombophlebitis of superficial veins of upper extremities    Possible venous stenosis from defibrillator  . Type II diabetes mellitus ([place])   . Venous insufficiency    Toe discoloration chronic  . Ventral hernia    April, 2014, result of his abdominal surgery  . Warfarin anticoagulation   . Wide-complex tachycardia (HCC)     Family History  Problem Relation Age of Onset  . Hypertension Mother   . Stroke Mother   . Diabetes  Father   . Coronary artery disease Father   . Other Father     DVT  . Diabetes Brother   . Colon cancer Neg Hx   . Heart attack Neg Hx     SOCIAL HISTORY: Social History   Social History  . Marital status: Married    Spouse name: N/A  . Number of children: N/A  . Years of education: N/A   Occupational History  . Retired- Nurse, mental health    Social History Main Topics  . Smoking status: Former Smoker    Packs/day: 3.00    Years: 39.00    Types: Cigarettes    Start date: 05/24/1956    Quit date: 03/22/1995  . Smokeless tobacco: Never Used  . Alcohol use 1.2 oz/week    2 Cans of beer per week  . Drug use: No  . Sexual activity: No   Other Topics Concern  . Not on file   Social History Narrative  . No narrative on file    Allergies  Allergen Reactions  . Avelox [Moxifloxacin Hcl In Nacl] Swelling, Rash and Other (See Comments)    Patient became hypotensive after infusion started Because of a history of documented adverse serious drug reaction;Medi Alert bracelet  is recommended  . Penicillins Anaphylaxis, Other (See Comments) and Swelling    REACTION: anaphylaxis Because of a history of documented adverse serious drug reaction;Medi Alert bracelet  is recommended    Current Outpatient Prescriptions  Medication Sig Dispense Refill  . ACCU-CHEK AVIVA PLUS test strip USE ONE STRIP TO CHECK GLUCOSE THREE TIMES DAILY AS DIRECTED 100 each 3  . ACCU-CHEK FASTCLIX LANCETS MISC USE ONE  TO CHECK GLUCOSE THREE TIMES DAILY 102 each 5  . acetaminophen (TYLENOL) 325 MG tablet Take 325 mg by mouth every 4 (four) hours as needed for moderate pain.     . Albuterol Sulfate (PROAIR RESPICLICK) 778 (90 Base) MCG/ACT AEPB Inhale 2 puffs into the lungs every 6 (six) hours as needed. For wheezing or shortness of breath. 1 each 0  . allopurinol (ZYLOPRIM) 100 MG tablet TAKE TWO TABLETS BY MOUTH ONCE DAILY 180 tablet 3  . atorvastatin (LIPITOR) 10 MG tablet Take 1 tablet (10 mg  total) by mouth daily. 90 tablet 3  . carboxymethylcellulose (REFRESH PLUS) 0.5 % SOLN Place 2 drops into both eyes daily.     . carvedilol (COREG) 6.25 MG tablet Take 1.5 tablets (9.375 mg total) by mouth 2 (two) times daily. 180 tablet 3  . Cholecalciferol 1000 units tablet Take 1,000 Units by mouth daily.    . colchicine 0.6 MG tablet Take 0.6 mg by mouth daily as needed (gout).     Marland Kitchen digoxin (LANOXIN) 0.125 MG tablet Take 1 tablet (0.125 mg total) by mouth daily. 30 tablet 6  . gabapentin (NEURONTIN) 300 MG capsule TAKE ONE CAPSULE BY MOUTH 4 TIMES  DAILY AS NEEDED FOR LEG PAIN 360 capsule 3  . insulin detemir (LEVEMIR) 100 UNIT/ML injection Inject 5 Units into the skin 2 (two) times daily.     . insulin regular (NOVOLIN R) 100 units/mL injection Inject 20 Units into the skin 2 (two) times daily before a meal.     . ipratropium (ATROVENT) 0.06 % nasal spray Place 2 sprays into both nostrils daily.     . methocarbamol (ROBAXIN) 750 MG tablet Take 750 mg by mouth 2 (two) times daily as needed for muscle spasms.    . nitroGLYCERIN (NITROSTAT) 0.4 MG SL tablet Place 1 tablet (0.4 mg total) under the tongue every 5 (five) minutes as needed for chest pain (MAX 3 TABLETS). 25 tablet 3  . omeprazole (PRILOSEC) 20 MG capsule Take 20 mg by mouth 2 (two) times daily before a meal.    . potassium chloride SA (K-DUR,KLOR-CON) 20 MEQ tablet Take 1.5 tablets (30 mEq total) by mouth daily. 45 tablet 6  . sacubitril-valsartan (ENTRESTO) 49-51 MG Take 1 tablet by mouth 2 (two) times daily. 60 tablet 6  . spironolactone (ALDACTONE) 25 MG tablet Take 1 tablet (25 mg total) by mouth daily. 30 tablet 6  . torsemide (DEMADEX) 20 MG tablet Take 3 tablets (60 mg total) by mouth daily. 90 tablet 6  . traMADol (ULTRAM) 50 MG tablet Take 100 mg by mouth every 8 (eight) hours as needed (pain).    Marland Kitchen warfarin (COUMADIN) 2.5 MG tablet TAKE ONE TABLET BY MOUTH AS DIRECTED (Patient taking differently: takes 1 tablet daily except  on wednesdday he takes 2) 120 tablet 1   No current facility-administered medications for this visit.     REVIEW OF SYSTEMS:  [X]  denotes positive finding, [ ]  denotes negative finding Cardiac  Comments:  Chest pain or chest pressure:    Shortness of breath upon exertion: X   Short of breath when lying flat:    Irregular heart rhythm: X       Vascular    Pain in calf, thigh, or hip brought on by ambulation:    Pain in feet at night that wakes you up from your sleep:     Blood clot in your veins:    Leg swelling:         Pulmonary    Oxygen at home:    Productive cough:     Wheezing:  X       Neurologic    Sudden weakness in arms or legs:     Sudden numbness in arms or legs:     Sudden onset of difficulty speaking or slurred speech:    Temporary loss of vision in one eye:     Problems with dizziness:         Gastrointestinal    Blood in stool:     Vomited blood:         Genitourinary    Burning when urinating:     Blood in urine:        Psychiatric    Major depression:         Hematologic    Bleeding problems:    Problems with blood clotting too easily:        Skin    Rashes or ulcers:        Constitutional    Fever or chills:      PHYSICAL EXAM: Vitals:   07/13/16 0834  BP: 100/66  Pulse: 64  Resp: (!) 24  Temp: 97  F (36.1 C)  TempSrc: Oral  SpO2: 97%  Weight: 227 lb 4.8 oz (103.1 kg)  Height: 6' (1.829 m)    GENERAL: The patient is a well-nourished male, in no acute distress. The vital signs are documented above. CARDIAC: There is a regular rate and rhythm.  VASCULAR: I do not detect carotid bruits. On the right side, which is the side with the wound, he has a palpable femoral pulse and a diminished but palpable right dorsalis pedis pulse. I cannot palpate a popliteal or posterior tibial pulse. On the left side, he has a palpable femoral pulse. I cannot palpate a popliteal or pedal pulses. He has no significant lower extremity  swelling. PULMONARY: There is good air exchange bilaterally without wheezing or rales. ABDOMEN: Soft and non-tender with normal pitched bowel sounds. He has a large ventral hernia. MUSCULOSKELETAL: There are no major deformities or cyanosis. NEUROLOGIC: No focal weakness or paresthesias are detected. SKIN: He has a wound on his right second toe without erythema or drainage. PSYCHIATRIC: The patient has a normal affect.  DATA:   ARTERIAL DOPPLER STUDY: I have independently interpreted his arterial Doppler study.  On the right side, he has a biphasic posterior tibial signal with a triphasic dorsalis pedis signal. ABI is 100%. Toe pressure on the right is 127 mmHg.  On the left side, he has a biphasic dorsalis pedis and posterior tibial signal. ABI is greater than 100% but is likely falsely elevated secondary to calcific disease. Toe pressure on the left is 62 mmHg.  CHEST CT: He had a chest CT on 05/30/2016 which showed that the diameter of his ascending aorta was 4.9 cm. The aortic arch was 4.4 cm in maximum diameter. Follow up study was recommended in 1 year.  MEDICAL ISSUES:  PERIPHERAL VASCULAR DISEASE WITH RIGHT SECOND TOE WOUND: I think the patient does have evidence of infrainguinal arterial occlusive disease bilaterally. However, on the right side he has a toe pressure of 127 mmHg which suggest adequate circulation for healing. He has a palpable dorsalis pedis pulse and normal arterial signals at the foot. I would not recommend further workup at this time and I think the wound should heal with continued aggressive wound care. I'll be happy to see him back anytime in the future with any new vascular issues. I do not think that the numbness in his right calf is related to peripheral vascular disease.  THORACIC AORTIC ANEURYSM: His ascending thoracic aorta measures 4.9 cm in maximum diameter. Follow up study was recommended in 1 year. Dr. Cyndia Bent did his previous heart surgery and we will  arrange for a CT scan of the chest and follow up visit with him in 1 year.  Deitra Mayo [facility] [phone]

## 2016-07-28 ENCOUNTER — Ambulatory Visit (INDEPENDENT_AMBULATORY_CARE_PROVIDER_SITE_OTHER): Payer: Medicare Other | Admitting: Pharmacist

## 2016-07-28 DIAGNOSIS — I4821 Permanent atrial fibrillation: Secondary | ICD-10-CM

## 2016-07-28 DIAGNOSIS — I4891 Unspecified atrial fibrillation: Secondary | ICD-10-CM

## 2016-07-28 DIAGNOSIS — I482 Chronic atrial fibrillation: Secondary | ICD-10-CM | POA: Diagnosis not present

## 2016-07-28 LAB — POCT INR: INR: 2.6

## 2016-08-01 ENCOUNTER — Other Ambulatory Visit (HOSPITAL_BASED_OUTPATIENT_CLINIC_OR_DEPARTMENT_OTHER): Payer: Medicare Other

## 2016-08-01 ENCOUNTER — Ambulatory Visit (HOSPITAL_BASED_OUTPATIENT_CLINIC_OR_DEPARTMENT_OTHER): Payer: Medicare Other

## 2016-08-01 ENCOUNTER — Ambulatory Visit (HOSPITAL_BASED_OUTPATIENT_CLINIC_OR_DEPARTMENT_OTHER): Payer: Medicare Other | Admitting: Hematology & Oncology

## 2016-08-01 VITALS — BP 116/56 | HR 51

## 2016-08-01 VITALS — BP 115/56 | HR 54 | Temp 98.0°F | Resp 16 | Wt 230.8 lb

## 2016-08-01 DIAGNOSIS — IMO0002 Reserved for concepts with insufficient information to code with codable children: Secondary | ICD-10-CM

## 2016-08-01 DIAGNOSIS — D45 Polycythemia vera: Secondary | ICD-10-CM

## 2016-08-01 DIAGNOSIS — E1151 Type 2 diabetes mellitus with diabetic peripheral angiopathy without gangrene: Secondary | ICD-10-CM

## 2016-08-01 DIAGNOSIS — E1165 Type 2 diabetes mellitus with hyperglycemia: Secondary | ICD-10-CM

## 2016-08-01 DIAGNOSIS — D582 Other hemoglobinopathies: Secondary | ICD-10-CM

## 2016-08-01 LAB — CMP (CANCER CENTER ONLY)
ALK PHOS: 69 U/L (ref 26–84)
ALT: 21 U/L (ref 10–47)
AST: 17 U/L (ref 11–38)
Albumin: 3.4 g/dL (ref 3.3–5.5)
BILIRUBIN TOTAL: 1 mg/dL (ref 0.20–1.60)
BUN, Bld: 29 mg/dL — ABNORMAL HIGH (ref 7–22)
CO2: 27 mEq/L (ref 18–33)
CREATININE: 1.7 mg/dL — AB (ref 0.6–1.2)
Calcium: 9.4 mg/dL (ref 8.0–10.3)
Chloride: 107 mEq/L (ref 98–108)
Glucose, Bld: 160 mg/dL — ABNORMAL HIGH (ref 73–118)
POTASSIUM: 4.7 meq/L (ref 3.3–4.7)
SODIUM: 142 meq/L (ref 128–145)
TOTAL PROTEIN: 6.2 g/dL — AB (ref 6.4–8.1)

## 2016-08-01 LAB — CBC WITH DIFFERENTIAL (CANCER CENTER ONLY)
BASO#: 0 10*3/uL (ref 0.0–0.2)
BASO%: 0.5 % (ref 0.0–2.0)
EOS%: 1.4 % (ref 0.0–7.0)
Eosinophils Absolute: 0.1 10*3/uL (ref 0.0–0.5)
HCT: 48.2 % (ref 38.7–49.9)
HGB: 15.8 g/dL (ref 13.0–17.1)
LYMPH#: 0.9 10*3/uL (ref 0.9–3.3)
LYMPH%: 14.5 % (ref 14.0–48.0)
MCH: 28.9 pg (ref 28.0–33.4)
MCHC: 32.8 g/dL (ref 32.0–35.9)
MCV: 88 fL (ref 82–98)
MONO#: 0.6 10*3/uL (ref 0.1–0.9)
MONO%: 9.4 % (ref 0.0–13.0)
NEUT#: 4.3 10*3/uL (ref 1.5–6.5)
NEUT%: 74.2 % (ref 40.0–80.0)
PLATELETS: 160 10*3/uL (ref 145–400)
RBC: 5.47 10*6/uL (ref 4.20–5.70)
RDW: 20.8 % — ABNORMAL HIGH (ref 11.1–15.7)
WBC: 5.9 10*3/uL (ref 4.0–10.0)

## 2016-08-01 NOTE — Progress Notes (Signed)
Dub Mikes "[name]" [name] presents today for phlebotomy per MD orders. Phlebotomy procedure started at 1405 and ended at 1415. 550 grams removed via 16 gauge needle to Right AC. Patient observed for 30 minutes after procedure without any incident. Patient tolerated procedure well.

## 2016-08-01 NOTE — Patient Instructions (Signed)
Therapeutic Phlebotomy, Care After  Refer to this sheet in the next few weeks. These instructions provide you with information about caring for yourself after your procedure. Your health care provider may also give you more specific instructions. Your treatment has been planned according to current medical practices, but problems sometimes occur. Call your health care provider if you have any problems or questions after your procedure.  WHAT TO EXPECT AFTER THE PROCEDURE  After your procedure, it is common to have:   Light-headedness or dizziness. You may feel faint.   Nausea.   Tiredness.  HOME CARE INSTRUCTIONS  Activities   Return to your normal activities as directed by your health care provider. Most people can go back to their normal activities right away.   Avoid strenuous physical activity and heavy lifting or pulling for about 5 hours after the procedure. Do not lift anything that is heavier than 10 lb (4.5 kg).   Athletes should avoid strenuous exercise for at least 12 hours.   Change positions slowly for the remainder of the day. This will help to prevent light-headedness or fainting.   If you feel light-headed, lie down until the feeling goes away.  Eating and Drinking   Be sure to eat well-balanced meals for the next 24 hours.   Drink enough fluid to keep your urine clear or pale yellow.   Avoid drinking alcohol on the day that you had the procedure.  Care of the Needle Insertion Site   Keep your bandage dry. You can remove the bandage after about 5 hours or as directed by your health care provider.   If you have bleeding from the needle insertion site, elevate your arm and press firmly on the site until the bleeding stops.   If you have bruising at the site, apply ice to the area:   Put ice in a plastic bag.   Place a towel between your skin and the bag.   Leave the ice on for 20 minutes, 2-3 times a day for the first 24 hours.   If the swelling does not go away after 24 hours, apply  a warm, moist washcloth to the area for 20 minutes, 2-3 times a day.  General Instructions   Avoid smoking for at least 30 minutes after the procedure.   Keep all follow-up visits as directed by your health care provider. It is important to continue with further therapeutic phlebotomy treatments as directed.  SEEK MEDICAL CARE IF:   You have redness, swelling, or pain at the needle insertion site.   You have fluid, blood, or pus coming from the needle insertion site.   You feel light-headed, dizzy, or nauseated, and the feeling does not go away.   You notice new bruising at the needle insertion site.   You feel weaker than normal.   You have a fever or chills.  SEEK IMMEDIATE MEDICAL CARE IF:   You have severe nausea or vomiting.   You have chest pain.   You have trouble breathing.    This information is not intended to replace advice given to you by your health care provider. Make sure you discuss any questions you have with your health care provider.    Document Released: 08/09/2010 Document Revised: 07/22/2014 Document Reviewed: 03/03/2014  Elsevier Interactive Patient Education 2016 [employer]

## 2016-08-01 NOTE — Progress Notes (Signed)
Hematology and Oncology Follow Up Visit  [name] "[name] [id number] 1940/04/12 76 y.o. 08/01/2016   Principle Diagnosis:   Polycythemia vera  Current Therapy:    Phlebotomy to maintain hematocrit below 45%.       Aspirin 81 mg by mouth weekly       Coumadin for chronic atrial fibrillation      Interim History:  Mr.  [name] is back for follow-up.we saw him 2 months ago. Since we last saw him, he has been hospitalized. He generally goes in because of heart failure.  It sounds like he is going be part of a new program with the cardiology folks. It sounds like he is going to have a procedure done that will help monitor for fluid overload.  Of note, he was last in the hospital back in April. As always, this was for heart failure.  They tell me that he is going to have a procedure in which they insert some type of monitoring device into one of his pulmonary arteries. I think this sounds quite fascinating.  We last phlebotomized him when he was here in March.  He's had no bleeding. He is on Coumadin.  His blood sugars have been doing pretty well.  Overall, I would say his performance status is ECOG 2.   Medications:  Current Outpatient Prescriptions:  .  ACCU-CHEK AVIVA PLUS test strip, USE ONE STRIP TO CHECK GLUCOSE THREE TIMES DAILY AS DIRECTED, Disp: 100 each, Rfl: 3 .  ACCU-CHEK FASTCLIX LANCETS MISC, USE ONE  TO CHECK GLUCOSE THREE TIMES DAILY, Disp: 102 each, Rfl: 5 .  acetaminophen (TYLENOL) 325 MG tablet, Take 325 mg by mouth every 4 (four) hours as needed for moderate pain. , Disp: , Rfl:  .  Albuterol Sulfate (PROAIR RESPICLICK) 811 (90 Base) MCG/ACT AEPB, Inhale 2 puffs into the lungs every 6 (six) hours as needed. For wheezing or shortness of breath., Disp: 1 each, Rfl: 0 .  allopurinol (ZYLOPRIM) 100 MG tablet, TAKE TWO TABLETS BY MOUTH ONCE DAILY, Disp: 180 tablet, Rfl: 3 .  atorvastatin (LIPITOR) 10 MG tablet, Take 1 tablet (10 mg total) by mouth daily.,  Disp: 90 tablet, Rfl: 3 .  carboxymethylcellulose (REFRESH PLUS) 0.5 % SOLN, Place 2 drops into both eyes daily. , Disp: , Rfl:  .  carvedilol (COREG) 6.25 MG tablet, Take 1.5 tablets (9.375 mg total) by mouth 2 (two) times daily., Disp: 180 tablet, Rfl: 3 .  Cholecalciferol 1000 units tablet, Take 1,000 Units by mouth daily., Disp: , Rfl:  .  colchicine 0.6 MG tablet, Take 0.6 mg by mouth daily as needed (gout). , Disp: , Rfl:  .  digoxin (LANOXIN) 0.125 MG tablet, Take 1 tablet (0.125 mg total) by mouth daily., Disp: 30 tablet, Rfl: 6 .  gabapentin (NEURONTIN) 300 MG capsule, TAKE ONE CAPSULE BY MOUTH 4 TIMES DAILY AS NEEDED FOR LEG PAIN, Disp: 360 capsule, Rfl: 3 .  insulin detemir (LEVEMIR) 100 UNIT/ML injection, Inject 5 Units into the skin 2 (two) times daily. , Disp: , Rfl:  .  insulin regular (NOVOLIN R) 100 units/mL injection, Inject 20 Units into the skin 2 (two) times daily before a meal. , Disp: , Rfl:  .  ipratropium (ATROVENT) 0.06 % nasal spray, Place 2 sprays into both nostrils daily. , Disp: , Rfl:  .  methocarbamol (ROBAXIN) 750 MG tablet, Take 750 mg by mouth 2 (two) times daily as needed for muscle spasms., Disp: , Rfl:  .  nitroGLYCERIN (NITROSTAT)  0.4 MG SL tablet, Place 1 tablet (0.4 mg total) under the tongue every 5 (five) minutes as needed for chest pain (MAX 3 TABLETS)., Disp: 25 tablet, Rfl: 3 .  omeprazole (PRILOSEC) 20 MG capsule, Take 20 mg by mouth 2 (two) times daily before a meal., Disp: , Rfl:  .  potassium chloride SA (K-DUR,KLOR-CON) 20 MEQ tablet, Take 1.5 tablets (30 mEq total) by mouth daily., Disp: 45 tablet, Rfl: 6 .  sacubitril-valsartan (ENTRESTO) 49-51 MG, Take 1 tablet by mouth 2 (two) times daily., Disp: 60 tablet, Rfl: 6 .  spironolactone (ALDACTONE) 25 MG tablet, Take 1 tablet (25 mg total) by mouth daily., Disp: 30 tablet, Rfl: 6 .  torsemide (DEMADEX) 20 MG tablet, Take 3 tablets (60 mg total) by mouth daily., Disp: 90 tablet, Rfl: 6 .  traMADol  (ULTRAM) 50 MG tablet, Take 100 mg by mouth every 8 (eight) hours as needed (pain)., Disp: , Rfl:  .  warfarin (COUMADIN) 2.5 MG tablet, TAKE ONE TABLET BY MOUTH AS DIRECTED (Patient taking differently: takes 1 tablet daily except on wednesdday he takes 2), Disp: 120 tablet, Rfl: 1  Allergies:  Allergies  Allergen Reactions  . Avelox [Moxifloxacin Hcl In Nacl] Swelling, Rash and Other (See Comments)    Patient became hypotensive after infusion started Because of a history of documented adverse serious drug reaction;Medi Alert bracelet  is recommended  . Penicillins Anaphylaxis, Other (See Comments) and Swelling    REACTION: anaphylaxis Because of a history of documented adverse serious drug reaction;Medi Alert bracelet  is recommended    Past Medical History, Surgical history, Social history, and Family History were reviewed and updated.  Review of Systems: As above  Physical Exam:  weight is 230 lb 12.8 oz (104.7 kg). His oral temperature is 98 F (36.7 C). His blood pressure is 115/56 (abnormal) and his pulse is 54 (abnormal). His respiration is 16 and oxygen saturation is 98%.   Well-developed and well-nourished white male. He is moderately obese. Head and neck exam shows no ocular or oral lesions. There is less conjunctival inflammation. There is no adenopathy in his neck. Lungs are clear. He has no rales, wheezes or rhonchi. Cardiac exam irregular rate and rhythm consistent with atrial fibrillation, he has  no murmurs, rubs or bruits. Abdomen is soft. He has good bowel sounds. He is somewhat obese. There is no palpable liver or spleen tip. Back exam no tenderness over the spine, ribs or hips. Extremities shows some chronic mild nonpitting edema of the legs. He does have a brace on the right foot. Skin exam no rashes. He still has a little bit of a ruddy complexion. Neurological exam is nonfocal.  Lab Results  Component Value Date   WBC 5.9 08/01/2016   HGB 15.8 08/01/2016   HCT 48.2  08/01/2016   MCV 88 08/01/2016   PLT 160 08/01/2016     Chemistry      Component Value Date/Time   NA 142 08/01/2016 1249   NA 141 03/09/2016 1320   K 4.7 08/01/2016 1249   K 4.5 03/09/2016 1320   CL 107 08/01/2016 1249   CO2 27 08/01/2016 1249   CO2 25 03/09/2016 1320   BUN 29 (H) 08/01/2016 1249   BUN 28.8 (H) 03/09/2016 1320   CREATININE 1.7 (H) 08/01/2016 1249   CREATININE 1.9 (H) 03/09/2016 1320      Component Value Date/Time   CALCIUM 9.4 08/01/2016 1249   CALCIUM 9.2 03/09/2016 1320   ALKPHOS 69 08/01/2016  1249   ALKPHOS 97 03/09/2016 1320   AST 17 08/01/2016 1249   AST 13 03/09/2016 1320   ALT 21 08/01/2016 1249   ALT 10 03/09/2016 1320   BILITOT 1.00 08/01/2016 1249   BILITOT 0.88 03/09/2016 1320       Impression and Plan: [name] is 76 year old male with polycythemia.   We will go ahead and phlebotomize him. I want to make sure we keep his hematocrit below 45%.   Overall, I really think he looks pretty good. I really do not see a lot of fluid retention.  We will phlebotomize him. We will then plan to get him back in 2 more months. I hope that they will have a good time at their beach house.    Volanda Napoleon, MD 5/14/20181:44 PM

## 2016-08-02 LAB — IRON AND TIBC
%SAT: 30 % (ref 20–55)
Iron: 116 ug/dL (ref 42–163)
TIBC: 386 ug/dL (ref 202–409)
UIBC: 270 ug/dL (ref 117–376)

## 2016-08-02 LAB — FERRITIN: Ferritin: 47 ng/ml (ref 22–316)

## 2016-08-03 ENCOUNTER — Ambulatory Visit (INDEPENDENT_AMBULATORY_CARE_PROVIDER_SITE_OTHER): Payer: Medicare Other | Admitting: *Deleted

## 2016-08-03 DIAGNOSIS — L97511 Non-pressure chronic ulcer of other part of right foot limited to breakdown of skin: Secondary | ICD-10-CM | POA: Diagnosis not present

## 2016-08-03 DIAGNOSIS — I255 Ischemic cardiomyopathy: Secondary | ICD-10-CM

## 2016-08-03 DIAGNOSIS — M47816 Spondylosis without myelopathy or radiculopathy, lumbar region: Secondary | ICD-10-CM | POA: Diagnosis not present

## 2016-08-03 LAB — CUP PACEART REMOTE DEVICE CHECK
Battery Remaining Longevity: 69 mo
Battery Voltage: 2.98 V
Brady Statistic AS VS Percent: 86.74 %
Date Time Interrogation Session: 20180516041805
HighPow Impedance: 69 Ohm
Implantable Lead Implant Date: 20090121
Implantable Lead Location: 753859
Implantable Lead Model: 4193
Implantable Lead Model: 5076
Implantable Pulse Generator Implant Date: 20141124
Lead Channel Impedance Value: 4047 Ohm
Lead Channel Impedance Value: 437 Ohm
Lead Channel Impedance Value: 437 Ohm
Lead Channel Impedance Value: 494 Ohm
Lead Channel Pacing Threshold Pulse Width: 0.4 ms
Lead Channel Setting Pacing Amplitude: 2.5 V
Lead Channel Setting Pacing Pulse Width: 0.4 ms
Lead Channel Setting Sensing Sensitivity: 0.3 mV
MDC IDC LEAD IMPLANT DT: 20090121
MDC IDC LEAD IMPLANT DT: 20090121
MDC IDC LEAD LOCATION: 753858
MDC IDC LEAD LOCATION: 753860
MDC IDC MSMT LEADCHNL LV IMPEDANCE VALUE: 4047 Ohm
MDC IDC MSMT LEADCHNL RA SENSING INTR AMPL: 3.75 mV
MDC IDC MSMT LEADCHNL RA SENSING INTR AMPL: 3.75 mV
MDC IDC MSMT LEADCHNL RV IMPEDANCE VALUE: 342 Ohm
MDC IDC MSMT LEADCHNL RV PACING THRESHOLD AMPLITUDE: 0.5 V
MDC IDC MSMT LEADCHNL RV SENSING INTR AMPL: 12.375 mV
MDC IDC MSMT LEADCHNL RV SENSING INTR AMPL: 12.375 mV
MDC IDC STAT BRADY AP VP PERCENT: 0 %
MDC IDC STAT BRADY AP VS PERCENT: 0 %
MDC IDC STAT BRADY AS VP PERCENT: 13.26 %
MDC IDC STAT BRADY RA PERCENT PACED: 0 %
MDC IDC STAT BRADY RV PERCENT PACED: 21.03 %

## 2016-08-03 NOTE — Progress Notes (Signed)
Remote ICD transmission.   

## 2016-08-04 ENCOUNTER — Encounter: Payer: Self-pay | Admitting: Cardiology

## 2016-08-08 NOTE — Addendum Note (Signed)
Addended by: Lianne Cure A on: 08/08/2016 02:47 PM   Modules accepted: Orders

## 2016-08-09 ENCOUNTER — Encounter (HOSPITAL_COMMUNITY): Payer: Medicare Other

## 2016-08-10 ENCOUNTER — Telehealth (HOSPITAL_COMMUNITY): Payer: Self-pay | Admitting: *Deleted

## 2016-08-10 ENCOUNTER — Ambulatory Visit (HOSPITAL_COMMUNITY)
Admission: RE | Admit: 2016-08-10 | Discharge: 2016-08-10 | Disposition: A | Discharge status: Home / Self Care | Payer: Medicare Other | Source: Ambulatory Visit | Attending: Cardiology | Admitting: Cardiology

## 2016-08-10 VITALS — BP 108/61 | HR 69 | Wt 231.5 lb

## 2016-08-10 DIAGNOSIS — I482 Chronic atrial fibrillation, unspecified: Secondary | ICD-10-CM

## 2016-08-10 DIAGNOSIS — E1122 Type 2 diabetes mellitus with diabetic chronic kidney disease: Secondary | ICD-10-CM | POA: Insufficient documentation

## 2016-08-10 DIAGNOSIS — Z7901 Long term (current) use of anticoagulants: Secondary | ICD-10-CM

## 2016-08-10 DIAGNOSIS — Z87891 Personal history of nicotine dependence: Secondary | ICD-10-CM | POA: Insufficient documentation

## 2016-08-10 DIAGNOSIS — Z794 Long term (current) use of insulin: Secondary | ICD-10-CM | POA: Diagnosis not present

## 2016-08-10 DIAGNOSIS — I255 Ischemic cardiomyopathy: Secondary | ICD-10-CM | POA: Diagnosis not present

## 2016-08-10 DIAGNOSIS — Z95810 Presence of automatic (implantable) cardiac defibrillator: Secondary | ICD-10-CM | POA: Insufficient documentation

## 2016-08-10 DIAGNOSIS — E784 Other hyperlipidemia: Secondary | ICD-10-CM | POA: Diagnosis not present

## 2016-08-10 DIAGNOSIS — I712 Thoracic aortic aneurysm, without rupture: Secondary | ICD-10-CM | POA: Diagnosis not present

## 2016-08-10 DIAGNOSIS — Z951 Presence of aortocoronary bypass graft: Secondary | ICD-10-CM | POA: Diagnosis not present

## 2016-08-10 DIAGNOSIS — Z79899 Other long term (current) drug therapy: Secondary | ICD-10-CM | POA: Diagnosis not present

## 2016-08-10 DIAGNOSIS — M109 Gout, unspecified: Secondary | ICD-10-CM | POA: Diagnosis not present

## 2016-08-10 DIAGNOSIS — N183 Chronic kidney disease, stage 3 (moderate): Secondary | ICD-10-CM | POA: Diagnosis not present

## 2016-08-10 DIAGNOSIS — E785 Hyperlipidemia, unspecified: Secondary | ICD-10-CM | POA: Diagnosis not present

## 2016-08-10 DIAGNOSIS — I5022 Chronic systolic (congestive) heart failure: Secondary | ICD-10-CM

## 2016-08-10 DIAGNOSIS — I2510 Atherosclerotic heart disease of native coronary artery without angina pectoris: Secondary | ICD-10-CM | POA: Diagnosis not present

## 2016-08-10 DIAGNOSIS — E7849 Other hyperlipidemia: Secondary | ICD-10-CM

## 2016-08-10 LAB — LIPID PANEL
Cholesterol: 146 mg/dL (ref 0–200)
HDL: 48 mg/dL (ref >40)
LDL Cholesterol: 71 mg/dL (ref 0–99)
Total CHOL/HDL Ratio: 3 RATIO
Triglycerides: 137 mg/dL (ref <150)
VLDL: 27 mg/dL (ref 0–40)

## 2016-08-10 LAB — BASIC METABOLIC PANEL
ANION GAP: 8 (ref 5–15)
BUN: 26 mg/dL — AB (ref 6–20)
CO2: 25 mmol/L (ref 22–32)
Calcium: 9 mg/dL (ref 8.9–10.3)
Chloride: 106 mmol/L (ref 101–111)
Creatinine, Ser: 1.85 mg/dL — ABNORMAL HIGH (ref 0.61–1.24)
GFR calc Af Amer: 39 mL/min — ABNORMAL LOW (ref >60)
GFR, EST NON AFRICAN AMERICAN: 34 mL/min — AB (ref >60)
Glucose, Bld: 132 mg/dL — ABNORMAL HIGH (ref 65–99)
Potassium: 5.1 mmol/L (ref 3.5–5.1)
Sodium: 139 mmol/L (ref 135–145)

## 2016-08-10 LAB — DIGOXIN LEVEL: DIGOXIN LVL: 1.2 ng/mL (ref 0.8–2.0)

## 2016-08-10 MED ORDER — CARVEDILOL 12.5 MG PO TABS: 12.5000 mg | ORAL_TABLET | Freq: Two times a day (BID) | ORAL | 6 refills | Status: DC

## 2016-08-10 NOTE — Telephone Encounter (Signed)
Notes recorded by [name], RN on 08/10/2016 at 5:33 PM EDT Pt's wife aware and agreeable, repeat bmet and dig level sch 6/4

## 2016-08-10 NOTE — Patient Instructions (Addendum)
Increase Torsemide to 80 mg (4 tabs) daily FOR 3 DAYS ONLY  Increase Carvedilol to 12.5 mg Twice daily   Labs today  Your physician recommends that you schedule a follow-up appointment in: 3 months

## 2016-08-10 NOTE — Telephone Encounter (Signed)
-----   Message from [name], MD sent at 08/10/2016  3:47 PM EDT ----- K borderline high, stop KCl supplement.  Creatinine stable.  Digoxin level mildly elevated but not trough, have him return at some point in the next week or two to get digoxin level drawn in the am before he takes his am dose.  Good lipids.

## 2016-08-10 NOTE — Progress Notes (Signed)
PCP: Dr. Quay Burow Cardiologist: Dr Aundra Dubin   HPI: [name] is a 76 year old with history of CAD s/p CABG, permanent atrial fib, S/P AAA repair, ischemic cardiomyopathy/chronic systolic heart failure,  Medtronic CRT-D system.   Admitted 4/14 through 07/06/16 with volume overload. Diuresed with IV lasix and transitioned to torsemide 60 mg daily. HF meds adjusted and he was started on Entresto. He was consented to start [employer] approval process. Discharge weight was 220 pounds.    He returns for followup today with his wife.  He has been doing well since hospital discharge.  No dyspnea walking on flat ground.  Main limitation has been low back pain.  He has been walking with a cane.  He does get short of breath showering on occasion.  No chest pain.  No orthopnea/PND.  He remains in atrial fibrillation (permanent).  Weight is up about 7 lbs since last appointment.  No BRBPR/melena.  No lightheadedness/dizziness.  He had a slowly healing toe ulcer, this has now healed however.  He saw Dr. Scot Dock with vascular.    Optivol: fluid index > threshold, impedance decreased.   Labs (5/18): K 4.7, creatinine 1.7, hgb 15.8, BNP 1001  PMH: 1. CAD: s/p CABG.  - LHC (12/17): totally occluded distal RCA, totally occluded mid LAD, totally occluded OM2 and OM3.  LIMA-LAD patent with 60% ostial stenosis.  SVG-OM3 and SVG-PDA were patent. Medical management.  2. Chronic systolic CHF: Ischemic cardiomyopathy.  He has a Medtronic CRT-D device.  - Echo (12/17) with EF 35-40%, diffuse hypokinesis, normal RV size and systolic function, mild [name].  3. AAA: s/p repair.  4. Atrial fibrillation: Permanent.  5. Gout 6. PAD: Saw Dr Scot Dock with [facility] in 4/18.  Noninvasive study concerning for infra-inguinal arterial occlusive disease bilaterally but appeared to have adequate circulation to heal toe ulcer.  7. CKD stage 3.  8. Ascending aortic aneurysm: CT chest 3/18 ascending aorta 4.9 cm, arch 4.4 cm.  9. Spinal stenosis 10.  Polycythemia vera 11. Hyperlipidemia 12. Type II diabetes    ROS: All systems negative except as listed in HPI, PMH and Problem List.  Social History   Social History  . Marital status: Married    Spouse name: N/A  . Number of children: N/A  . Years of education: N/A   Occupational History  . Retired- Nurse, mental health    Social History Main Topics  . Smoking status: Former Smoker    Packs/day: 3.00    Years: 39.00    Types: Cigarettes    Start date: 05/24/1956    Quit date: 03/22/1995  . Smokeless tobacco: Never Used  . Alcohol use 1.2 oz/week    2 Cans of beer per week  . Drug use: No  . Sexual activity: No   Other Topics Concern  . Not on file   Social History Narrative  . No narrative on file    Family History  Problem Relation Age of Onset  . Hypertension Mother   . Stroke Mother   . Diabetes Father   . Coronary artery disease Father   . Other Father        DVT  . Diabetes Brother   . Colon cancer Neg Hx   . Heart attack Neg Hx      Current Outpatient Prescriptions  Medication Sig Dispense Refill  . ACCU-CHEK AVIVA PLUS test strip USE ONE STRIP TO CHECK GLUCOSE THREE TIMES DAILY AS DIRECTED 100 each 3  . ACCU-CHEK FASTCLIX LANCETS MISC USE  ONE  TO CHECK GLUCOSE THREE TIMES DAILY 102 each 5  . acetaminophen (TYLENOL) 325 MG tablet Take 325 mg by mouth every 4 (four) hours as needed for moderate pain.     . Albuterol Sulfate (PROAIR RESPICLICK) 149 (90 Base) MCG/ACT AEPB Inhale 2 puffs into the lungs every 6 (six) hours as needed. For wheezing or shortness of breath. 1 each 0  . allopurinol (ZYLOPRIM) 100 MG tablet TAKE TWO TABLETS BY MOUTH ONCE DAILY 180 tablet 3  . atorvastatin (LIPITOR) 10 MG tablet Take 1 tablet (10 mg total) by mouth daily. 90 tablet 3  . carboxymethylcellulose (REFRESH PLUS) 0.5 % SOLN Place 2 drops into both eyes daily.     . carvedilol (COREG) 12.5 MG tablet Take 1 tablet (12.5 mg total) by mouth 2 (two) times daily. 60 tablet  6  . Cholecalciferol 1000 units tablet Take 1,000 Units by mouth daily.    . colchicine 0.6 MG tablet Take 0.6 mg by mouth daily as needed (gout).     Marland Kitchen digoxin (LANOXIN) 0.125 MG tablet Take 1 tablet (0.125 mg total) by mouth daily. 30 tablet 6  . gabapentin (NEURONTIN) 300 MG capsule TAKE ONE CAPSULE BY MOUTH 4 TIMES DAILY AS NEEDED FOR LEG PAIN 360 capsule 3  . insulin detemir (LEVEMIR) 100 UNIT/ML injection Inject 5 Units into the skin 2 (two) times daily.     . insulin regular (NOVOLIN R) 100 units/mL injection Inject 20 Units into the skin 2 (two) times daily before a meal.     . ipratropium (ATROVENT) 0.06 % nasal spray Place 2 sprays into both nostrils daily.     . methocarbamol (ROBAXIN) 750 MG tablet Take 750 mg by mouth 2 (two) times daily as needed for muscle spasms.    Marland Kitchen omeprazole (PRILOSEC) 20 MG capsule Take 20 mg by mouth 2 (two) times daily before a meal.    . potassium chloride SA (K-DUR,KLOR-CON) 20 MEQ tablet Take 1.5 tablets (30 mEq total) by mouth daily. 45 tablet 6  . sacubitril-valsartan (ENTRESTO) 49-51 MG Take 1 tablet by mouth 2 (two) times daily. 60 tablet 6  . spironolactone (ALDACTONE) 25 MG tablet Take 1 tablet (25 mg total) by mouth daily. 30 tablet 6  . torsemide (DEMADEX) 20 MG tablet Take 3 tablets (60 mg total) by mouth daily. 90 tablet 6  . traMADol (ULTRAM) 50 MG tablet Take 100 mg by mouth every 8 (eight) hours as needed (pain).    Marland Kitchen warfarin (COUMADIN) 2.5 MG tablet TAKE ONE TABLET BY MOUTH AS DIRECTED (Patient taking differently: takes 1 tablet daily except on wednesdday he takes 2) 120 tablet 1  . nitroGLYCERIN (NITROSTAT) 0.4 MG SL tablet Place 1 tablet (0.4 mg total) under the tongue every 5 (five) minutes as needed for chest pain (MAX 3 TABLETS). (Patient not taking: Reported on 08/10/2016) 25 tablet 3   No current facility-administered medications for this encounter.     Vitals:   08/10/16 1323  BP: 108/61  Pulse: 69  SpO2: 98%  Weight: 231 lb 8  oz (105 kg)    PHYSICAL EXAM: General:  Well appearing. No resp difficulty HEENT: normal Neck: supple. JVP 8 cm. Carotids 2+ bilaterally; no bruits. No lymphadenopathy or thryomegaly appreciated. Cor: PMI normal. Irregular rate & rhythm. No rubs, gallops or murmurs. Lungs: clear Abdomen: round, soft, nontender, nondistended. No hepatosplenomegaly. No bruits or masses. Good bowel sounds. Extremities: no cyanosis, clubbing, rash.  Trace ankle edema.  Neuro: alert & orientedx3, cranial  nerves grossly intact. Moves all 4 extremities w/o difficulty. Affect pleasant.  ASSESSMENT & PLAN: 1. Chronic Systolic Heart Failure: Ischemic cardiomyopathy. Echo 12/17 with EF 35-40%. Medtronic CRT-D.  NYHA class II.  On exam, he does not appear particularly volume overloaded.  However, by Optivol and by rise in weight, I suspect he has developed some volume overload.   - Continue digoxin, check level today.  - Increase torsemide to 80 mg daily x 3 days, then back to 60 mg daily.  - Continue current Entresto and spironolactone.  - Can increase Coreg to 12.5 mg bid.  - BMET today.  - We are working on [employer] for him, in appeals process.  2. Atrial fibrillation: Permanent.  Rate-controlled.  Continue warfarin. Recent CBC ok.  3. CAD: s/p CABG.  No chest pain.  - No ASA given warfarin use.  - Continue statin.  Check lipids today.  4. CKD Stage III: BMET today.  5. Gout: stable. Continue allopurinol.  6. Ascending aortic aneurysm: 3/18 CTA 4.9 cm ascending aorta.  Repeat CTA in 3/19, he will follow with Dr. Cyndia Bent for this.   Loralie Champagne  08/10/2016 4:23 PM

## 2016-08-16 ENCOUNTER — Other Ambulatory Visit: Payer: Self-pay | Admitting: Internal Medicine

## 2016-08-19 ENCOUNTER — Other Ambulatory Visit: Payer: Self-pay | Admitting: Internal Medicine

## 2016-08-22 ENCOUNTER — Ambulatory Visit (HOSPITAL_COMMUNITY)
Admission: RE | Admit: 2016-08-22 | Discharge: 2016-08-22 | Disposition: A | Discharge status: Home / Self Care | Payer: Medicare Other | Source: Ambulatory Visit | Attending: Cardiology | Admitting: Cardiology

## 2016-08-22 ENCOUNTER — Encounter (HOSPITAL_COMMUNITY): Payer: Self-pay | Admitting: Cardiology

## 2016-08-22 ENCOUNTER — Other Ambulatory Visit (HOSPITAL_COMMUNITY): Payer: Self-pay | Admitting: *Deleted

## 2016-08-22 ENCOUNTER — Telehealth (HOSPITAL_COMMUNITY): Payer: Self-pay | Admitting: *Deleted

## 2016-08-22 DIAGNOSIS — I5022 Chronic systolic (congestive) heart failure: Secondary | ICD-10-CM | POA: Insufficient documentation

## 2016-08-22 DIAGNOSIS — I5032 Chronic diastolic (congestive) heart failure: Secondary | ICD-10-CM

## 2016-08-22 LAB — BASIC METABOLIC PANEL
ANION GAP: 7 (ref 5–15)
BUN: 20 mg/dL (ref 6–20)
CHLORIDE: 107 mmol/L (ref 101–111)
CO2: 27 mmol/L (ref 22–32)
Calcium: 8.9 mg/dL (ref 8.9–10.3)
Creatinine, Ser: 1.48 mg/dL — ABNORMAL HIGH (ref 0.61–1.24)
GFR calc non Af Amer: 45 mL/min — ABNORMAL LOW (ref >60)
GFR, EST AFRICAN AMERICAN: 52 mL/min — AB (ref >60)
Glucose, Bld: 158 mg/dL — ABNORMAL HIGH (ref 65–99)
POTASSIUM: 3.7 mmol/L (ref 3.5–5.1)
Sodium: 141 mmol/L (ref 135–145)

## 2016-08-22 LAB — DIGOXIN LEVEL: Digoxin Level: 0.8 ng/mL (ref 0.8–2.0)

## 2016-08-22 MED ORDER — WARFARIN SODIUM 2.5 MG PO TABS: 2.5000 mg | ORAL_TABLET | ORAL | 1 refills | Status: DC

## 2016-08-22 MED ORDER — TORSEMIDE 20 MG PO TABS: 80.0000 mg | ORAL_TABLET | Freq: Every day | ORAL | 6 refills | Status: DC

## 2016-08-22 MED ORDER — POTASSIUM CHLORIDE CRYS ER 20 MEQ PO TBCR: 20.0000 meq | EXTENDED_RELEASE_TABLET | Freq: Every day | ORAL | 3 refills | Status: DC

## 2016-08-22 NOTE — Telephone Encounter (Signed)
When pt was in for labs this AM he let us know his wt was up 11 lbs and he had increased edema in LE.  Dr Aundra Dubin aware and reviewed pt's labs from today, he would like pt to increase Torsemide to 80 mg daily, restart K-dur 20 meq daily and repeat labs in about 2 weeks.  Pt's wife aware and agreeable, repeat labs sch 6/18

## 2016-08-24 ENCOUNTER — Telehealth: Payer: Self-pay

## 2016-08-24 ENCOUNTER — Encounter: Payer: Self-pay | Admitting: *Deleted

## 2016-08-24 DIAGNOSIS — Z006 Encounter for examination for normal comparison and control in clinical research program: Secondary | ICD-10-CM

## 2016-08-24 NOTE — Progress Notes (Signed)
Per Dr. Aundra Dubin, I contacted [name] "[name]" [place] about enrollment in the [place] HF Study.  I spoke with patients' spouse on the phone as patient is extremely [place] on the phone.  Wife states that [name] would be very interested in the study and has an appointment in the HF clinic on June 18th.  We discussed meeting prior to HF lab appointment in order to directly communicate with [name].  Will mail patient information and ICF for review prior to meeting on June 18th.

## 2016-08-24 NOTE — Telephone Encounter (Signed)
Pt's wife called clinic states pt is scheduled for another radiofrequency ablation on his back on 09/08/16.  Pt has been instructed by Dr Clydell Hakim to hold Coumadin x 5 days prior last dosage on 09/02/16 prior to procedure on 09/08/16.   Discussed with Fuller [place], PharmD pt has been cleared in the past by Dr Irish Lack to hold his Coumadin without Lovenox bridging, see telephone note in [facility] 06/07/16.  Pt ok to hold Coumadin x 5 days without Lovenox bridge.   Pt had follow-up appt scheduled for 09/08/16, cancelled appt secondary to holding Coumadin and rescheduled to 09/15/16.  Pt and pt's wife aware.

## 2016-08-26 ENCOUNTER — Encounter (HOSPITAL_COMMUNITY): Payer: Self-pay | Admitting: *Deleted

## 2016-08-31 DIAGNOSIS — H903 Sensorineural hearing loss, bilateral: Secondary | ICD-10-CM | POA: Diagnosis not present

## 2016-08-31 DIAGNOSIS — H6123 Impacted cerumen, bilateral: Secondary | ICD-10-CM | POA: Diagnosis not present

## 2016-09-02 ENCOUNTER — Other Ambulatory Visit (INDEPENDENT_AMBULATORY_CARE_PROVIDER_SITE_OTHER): Payer: Medicare Other

## 2016-09-02 DIAGNOSIS — Z794 Long term (current) use of insulin: Secondary | ICD-10-CM

## 2016-09-02 DIAGNOSIS — E1165 Type 2 diabetes mellitus with hyperglycemia: Secondary | ICD-10-CM

## 2016-09-02 LAB — COMPREHENSIVE METABOLIC PANEL
ALBUMIN: 3.6 g/dL (ref 3.5–5.2)
ALK PHOS: 56 U/L (ref 39–117)
ALT: 9 U/L (ref 0–53)
AST: 10 U/L (ref 0–37)
BUN: 34 mg/dL — AB (ref 6–23)
CALCIUM: 8.9 mg/dL (ref 8.4–10.5)
CHLORIDE: 104 meq/L (ref 96–112)
CO2: 27 mEq/L (ref 19–32)
CREATININE: 1.69 mg/dL — AB (ref 0.40–1.50)
GFR: 42.21 mL/min — ABNORMAL LOW (ref >60.00)
Glucose, Bld: 152 mg/dL — ABNORMAL HIGH (ref 70–99)
POTASSIUM: 4.7 meq/L (ref 3.5–5.1)
Sodium: 139 mEq/L (ref 135–145)
Total Bilirubin: 0.5 mg/dL (ref 0.2–1.2)
Total Protein: 5.7 g/dL — ABNORMAL LOW (ref 6.0–8.3)

## 2016-09-02 LAB — MICROALBUMIN / CREATININE URINE RATIO
CREATININE, U: 103.1 mg/dL
MICROALB/CREAT RATIO: 3.2 mg/g (ref 0.0–30.0)
Microalb, Ur: 3.3 mg/dL — ABNORMAL HIGH (ref 0.0–1.9)

## 2016-09-03 LAB — FRUCTOSAMINE: Fructosamine: 225 umol/L (ref 0–285)

## 2016-09-05 ENCOUNTER — Encounter: Payer: Self-pay | Admitting: *Deleted

## 2016-09-05 DIAGNOSIS — Z006 Encounter for examination for normal comparison and control in clinical research program: Secondary | ICD-10-CM

## 2016-09-05 NOTE — Progress Notes (Signed)
VictoriaInformed Consent  Subject Name: [name]  Subject met inclusion and exclusion criteria. The informed consent form, study requirements and expectations were reviewed with the subject and questions and concerns were addressed prior to the signing of the consent form. The subject verbalized understanding of the trail requirements. The subject agreed to participate in the [facility] and signed the informed consent. The informed consent was obtained prior to performance of any protocol-specific procedures for the subject. A copy of the signed informed consent was given to the subject and a copy was placed in the subject's medical record.  Duncan Dull, RN 09/05/2016

## 2016-09-07 ENCOUNTER — Encounter: Payer: Self-pay | Admitting: *Deleted

## 2016-09-07 ENCOUNTER — Ambulatory Visit (INDEPENDENT_AMBULATORY_CARE_PROVIDER_SITE_OTHER): Payer: Medicare Other | Admitting: Endocrinology

## 2016-09-07 VITALS — BP 130/68 | HR 52 | Ht 72.0 in | Wt 236.0 lb

## 2016-09-07 DIAGNOSIS — E1159 Type 2 diabetes mellitus with other circulatory complications: Secondary | ICD-10-CM | POA: Diagnosis not present

## 2016-09-07 DIAGNOSIS — Z794 Long term (current) use of insulin: Secondary | ICD-10-CM | POA: Diagnosis not present

## 2016-09-07 NOTE — Patient Instructions (Signed)
Check blood sugars on waking up  3/7 days  Also check blood sugars about 2 hours after a meal and do this after different meals by rotation  Recommended blood sugar levels on waking up is 90-130 and about 2 hours after meal is 130-160  Please bring your blood sugar monitor to each visit, thank you   

## 2016-09-07 NOTE — Progress Notes (Signed)
Patient ID: [name], male   DOB: 01-30-1941, 76 y.o.   MRN: [id number]    Reason for Appointment : F/u for Type 2 Diabetes  History of Present Illness          Diagnosis: Type 2 diabetes mellitus, date of diagnosis: 2008       Past history: He was initially treated with Metformin and also given Amayl at some point. With this he had fair control of his diabetes with A1c had usually been over 7% except once in 2013.  He was taken off metformin probably in March this year after a hospitalization, possibly because of renal dysfunction He was started on Levemir insulin in May when his blood sugars were significantly higher, fasting readings averaging 220.  Levemir progressively increased and was on 35 units at night since 12/20/12 He has not been on any other medications either orally or injectable for his diabetes Because of his A1c of over 10% in 10/14 on basal insulin alone he was started on NovoLog with each meal in 10/14  Recent history:     INSULIN regimen is described as: Levemir 5 units 2 times a day, Regular Insulin 5 units at supper  He is  on Levemir since he could not afford Toujeo Also  on Regular Insulin instead of Humalog also because of high cost  A1c on his last visit was higher at 6.8, previously had been 6% Fructosamine is excellent at 229    Current management, blood sugar patterns and problems identified:  He was started on regular insulin at suppertime because of relatively high readings postprandially  Levemir has been continued unchanged  With this his postprandial readings are under 160 average both after lunch and supper  Fasting blood sugars are also somewhat lower and fairly stable  He is very compliant with checking his blood sugars as directed  Appetite has been fairly good and he is regaining some of the lost weight  Again generally eating only 2 meals a day  Compliance with the medical regimen: Good   Glucose  monitoring:  done  Up to 3  times a day        Glucometer:  Accu-Chek    Blood Glucose readings from download as follows  Mean values apply above for all meters except median for One Touch  PRE-MEAL Fasting Lunch Dinner Bedtime Overall  Glucose range:  105-166    114-209    Mean/median: 124   158  138   POST-MEAL PC Breakfast PC Lunch PC Dinner  Glucose range:     Mean/median:  152     Self-care: The diet that the patient has been following is: Smaller portions and relatively balanced meals  Meals: 2 meals per day. Noon and 6 pm  Low fat meals At dinnertime. Avoiding drinks with sugar and no juices        Physical activity: exercise: Minimal, limited by back pain And shortness of breath           Dietician visit: Most recent: Unknown.  CDE visit: 11/14                Weight control:    Wt Readings from Last 3 Encounters:  09/07/16 236 lb (107 kg)  08/10/16 231 lb 8 oz (105 kg)  08/01/16 230 lb 12.8 oz (104.7 kg)    Lab Results  Component Value Date   HGBA1C 6.8 (H) 07/02/2016   HGBA1C 6.0 04/11/2016   HGBA1C 6.3 (  H) 03/15/2016   Lab Results  Component Value Date   MICROALBUR 3.3 (H) 09/02/2016   LDLCALC 71 08/10/2016   CREATININE 1.69 (H) 09/02/2016    Other active problems: See review of systems   Allergies as of 09/07/2016      Reactions   Avelox [moxifloxacin Hcl In Nacl] Swelling, Rash, Other (See Comments)   Patient became hypotensive after infusion started Because of a history of documented adverse serious drug reaction;Medi Alert bracelet  is recommended   Penicillins Anaphylaxis, Other (See Comments), Swelling   REACTION: anaphylaxis Because of a history of documented adverse serious drug reaction;Medi Alert bracelet  is recommended      Medication List       Accurate as of 09/07/16  9:18 PM. Always use your most recent med list.          ACCU-CHEK AVIVA PLUS test strip Generic drug:  glucose blood USE ONE STRIP TO CHECK GLUCOSE THREE TIMES DAILY  AS DIRECTED   ACCU-CHEK FASTCLIX LANCETS Misc USE ONE  TO CHECK GLUCOSE THREE TIMES DAILY   acetaminophen 325 MG tablet Commonly known as:  TYLENOL Take 325 mg by mouth every 4 (four) hours as needed for moderate pain.   Albuterol Sulfate 108 (90 Base) MCG/ACT Aepb Commonly known as:  PROAIR RESPICLICK Inhale 2 puffs into the lungs every 6 (six) hours as needed. For wheezing or shortness of breath.   allopurinol 100 MG tablet Commonly known as:  ZYLOPRIM TAKE TWO TABLETS BY MOUTH ONCE DAILY   atorvastatin 10 MG tablet Commonly known as:  LIPITOR Take 1 tablet (10 mg total) by mouth daily.   carboxymethylcellulose 0.5 % Soln Commonly known as:  REFRESH PLUS Place 2 drops into both eyes daily.   carvedilol 12.5 MG tablet Commonly known as:  COREG Take 1 tablet (12.5 mg total) by mouth 2 (two) times daily.   Cholecalciferol 1000 units tablet Take 1,000 Units by mouth daily.   colchicine 0.6 MG tablet Take 0.6 mg by mouth daily as needed (gout).   digoxin 0.125 MG tablet Commonly known as:  LANOXIN Take 1 tablet (0.125 mg total) by mouth daily.   gabapentin 300 MG capsule Commonly known as:  NEURONTIN TAKE ONE CAPSULE BY MOUTH 4 TIMES DAILY AS NEEDED FOR LEG PAIN   ipratropium 0.06 % nasal spray Commonly known as:  ATROVENT Place 2 sprays into both nostrils daily.   LEVEMIR 100 UNIT/ML injection Generic drug:  insulin detemir Inject 5 Units into the skin 2 (two) times daily.   methocarbamol 750 MG tablet Commonly known as:  ROBAXIN Take 750 mg by mouth 2 (two) times daily as needed for muscle spasms.   nitroGLYCERIN 0.4 MG SL tablet Commonly known as:  NITROSTAT Place 1 tablet (0.4 mg total) under the tongue every 5 (five) minutes as needed for chest pain (MAX 3 TABLETS).   NOVOLIN R 100 units/mL injection Generic drug:  insulin regular Inject 5 Units into the skin daily. Takes this at dinner   omeprazole 20 MG capsule Commonly known as:  PRILOSEC Take 20  mg by mouth 2 (two) times daily before a meal.   potassium chloride SA 20 MEQ tablet Commonly known as:  K-DUR,KLOR-CON Take 1 tablet (20 mEq total) by mouth daily.   sacubitril-valsartan 49-51 MG Commonly known as:  ENTRESTO Take 1 tablet by mouth 2 (two) times daily.   spironolactone 25 MG tablet Commonly known as:  ALDACTONE Take 1 tablet (25 mg total) by mouth daily.  torsemide 20 MG tablet Commonly known as:  DEMADEX Take 4 tablets (80 mg total) by mouth daily.   traMADol 50 MG tablet Commonly known as:  ULTRAM Take 100 mg by mouth every 8 (eight) hours as needed (pain).   warfarin 2.5 MG tablet Commonly known as:  COUMADIN Take 1 tablet (2.5 mg total) by mouth See admin instructions.       Allergies:  Allergies  Allergen Reactions  . Avelox [Moxifloxacin Hcl In Nacl] Swelling, Rash and Other (See Comments)    Patient became hypotensive after infusion started Because of a history of documented adverse serious drug reaction;Medi Alert bracelet  is recommended  . Penicillins Anaphylaxis, Other (See Comments) and Swelling    REACTION: anaphylaxis Because of a history of documented adverse serious drug reaction;Medi Alert bracelet  is recommended    Past Medical History:  Diagnosis Date  . AAA (abdominal aortic aneurysm) ([place])    Surgical repair 11/2002.  . Adenomatous colon polyp   . Alcohol ingestion of more than four drinks per week    Excess beer  not a dependency problem  . Aortic valve sclerosis   . Arthritis   . Atrial fibrillation ([place])    Previous long-term amiodarone therapy with multiple cardioversions / amiodarone stopped September, 2009  . Atrial flutter [facility])    Started November, 2010, Left-sided and cannot ablate  . Bony abnormality    Patient's manubrium is slightly displaced to the right  . CAD (coronary artery disease)    a. s/p CABG 2004;  b. 02/2016 Cath: LM nl, LAD 70p, 164m, D1 50, D2 50, RI 50ost, LCX 33m, OM2 100, OM3 100, RCA 70p,  100d, VG->OM3 ok, LIMA->LAD 60ost, VG->RPDA  ok.  . 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 01/2013 MDT DTBA 1D1 [place] CRT-D, ser # [id number].  . Carotid artery disease ([place])    Doppler, December, 2013, 0-39% bilateral  . Chronic systolic CHF (congestive heart failure) ([place])    a. 02/2016 Echo: EF 35-40%, diff HK, triv AI, mildly dil Ao root 2mm, mild MR, sev dil LA, triv TR.  Marland Kitchen Chronotropic incompetence    IV pacing rate adjusted  . CKD (chronic kidney disease), stage III   . COPD (chronic obstructive pulmonary disease) ([place])   . Dilated aortic root ([place])   . Discolored skin   . Diverticulosis   . Drug therapy    Redness and swelling with Avelox infusion May 24, 2011  . Eye abnormality    Ophthalmologist questions a clot in one of his eyes, May, 2012  . Gout   . Hyperlipidemia   . Hypertension   . Internal hemorrhoids   . Ischemic cardiomyopathy   . Left atrial thrombus    Remote past... cardioversions done since that time  . Mitral regurgitation    Mild echo  . Myocardial infarction ([place])   . Nasal drainage    Chronic  . Overweight(278.02)   . Pericardial effusion   . Pleural effusion    Large loculated effusion on the left side November, 2011. This was tapped. It was exudative. Cytology revealed no cancer no proof of mesothelioma area pulmonary team felt that no further workup was needed  . Pleural thickening   . Pneumonia   . Polycythemia vera ([place]) 07/28/2014  . SOB (shortness of breath)    Large left effusion/ thoracentesis/hospitalization/November, 2011... exudated.. cytology negative.. Dr.Wert.. no proof of mesothelioma  . Spinal stenosis    Surgery Dr.Elsner  .  Thrombophlebitis of superficial veins of upper extremities    Possible venous stenosis from defibrillator  . Type II diabetes mellitus ([place]) 2008  . Venous insufficiency    Toe discoloration chronic  . Ventral hernia    April, 2014, result of his abdominal surgery  .  Warfarin anticoagulation   . Wide-complex tachycardia ([place])     Past Surgical History:  Procedure Laterality Date  . ABDOMINAL AORTIC ANEURYSM REPAIR  11/2002  . ANTERIOR CERVICAL DECOMP/DISCECTOMY FUSION  1995  . BACK SURGERY    . BI-VENTRICULAR PACEMAKER INSERTION (CRT-P)  02-11-2013   Pt with previously implanted MDT CRTD downgraded to CRTP by Dr Lovena Le 02-11-13  . CARDIAC CATHETERIZATION  "several"  . CARDIAC CATHETERIZATION N/A 03/18/2016   Procedure: RIGHT/LEFT HEART CATH AND CORONARY/GRAFT ANGIOGRAPHY;  Surgeon: Nelva Bush, MD;  Location: [place] [facility];  Service: Cardiovascular;  Laterality: N/A;  . CATARACT EXTRACTION W/ INTRAOCULAR LENS  IMPLANT, BILATERAL    . COLONOSCOPY W/ POLYPECTOMY    . CORONARY ANGIOPLASTY WITH STENT PLACEMENT  2004  . CORONARY ARTERY BYPASS GRAFT  2004   CABG X4  . FRACTURE SURGERY    . IMPLANTABLE CARDIOVERTER DEFIBRILLATOR (ICD) GENERATOR CHANGE N/A 02/11/2013   Procedure: ICD GENERATOR CHANGE;  Surgeon: Evans Lance, MD;  [facility];  Service: Cardiovascular;  Laterality: N/A;  . INCISION AND DRAINAGE ABSCESS / HEMATOMA OF BURSA / KNEE / THIGH    . INSERT / REPLACE / REMOVE PACEMAKER  2009   original pacer/defibrillator; Dr. Lovena Le 2009... by the pacing  . LUMBAR FUSION  2010  . ORIF TIBIA & FIBULA FRACTURES Right 2000s  . PILONIDAL CYST EXCISION    . SPINAL CORD STIMULATOR IMPLANT  12/2011  . SURGERY SCROTAL / TESTICULAR      Family History  Problem Relation Age of Onset  . Hypertension Mother   . Stroke Mother   . Diabetes Father   . Coronary artery disease Father   . Other Father        DVT  . Diabetes Brother   . Colon cancer Neg Hx   . Heart attack Neg Hx     Social History:  reports that he quit smoking about 21 years ago. His smoking use included Cigarettes. He started smoking about 60 years ago. He has a 117.00 pack-year smoking history. He has never used smokeless tobacco. He reports that he drinks about  1.2 oz of alcohol per week . He reports that he does not use drugs.    Review of Systems       Lipids: Treated with Lipitor And is taking 10 mg, Followed by PCP and cardiologist  LDL is under 70 more recently   Lab Results  Component Value Date   CHOL 146 08/10/2016   HDL 48 08/10/2016   LDLCALC 71 08/10/2016   TRIG 137 08/10/2016   CHOLHDL 3.0 08/10/2016       The blood pressure has been under control with current regimen of Spironolactone, Carvedilol and currently not on amlodipine and losartan  Renal dysfunction: Creatinine has stabilized  Lab Results  Component Value Date   CREATININE 1.69 (H) 09/02/2016   CREATININE 1.48 (H) 08/22/2016   CREATININE 1.85 (H) 08/10/2016   CREATININE 1.7 (H) 08/01/2016   CREATININE 1.83 (H) 07/11/2016   CREATININE 1.58 (H) 07/06/2016         Has known neuropathy: He has had Long-standing numbness, pains and tingling in his feet and lower legs.  Numbness is  worse in the mornings Mostly having  Less symptoms in day Takes gabapentin for this and uses to tablet at suppertime and 1 tablet at bedtime    Physical Examination:  BP 130/68   Pulse (!) 52   Ht 6' (1.829 m)   Wt 236 lb (107 kg)   SpO2 98%   BMI 32.01 kg/m   Diabetic Foot Exam - Simple   Simple Foot Form Diabetic Foot exam was performed with the following findings:  Yes   Visual Inspection No deformities, no ulcerations, no other skin breakdown bilaterally:  Yes See comments:  Yes Sensation Testing See comments:  Yes Pulse Check See comments:  Yes Comments Markedly decreased monofilament sensation in the toes and absent in some of the dose Pedal pulses only 1+ left dorsalis pedis, right side not palpable Mild edema of the right lower leg and foot        ASSESSMENT/PLAN:  Diabetes type 2 See history of present illness for detailed discussion of his current management, blood sugar patterns and problems identified  His insulin requirement has been quite  Smaller despite his getting back his appetite lately Only requiring 5 units of basal insulin twice a day and 5 units of regular insulin at suppertime As discussed above his blood sugars are excellent with only some variability and average glucose at home 138 Fructosamine is at a good level at 225  He will continue his regimen unchanged  NEUROPATHY:  Symptomatically controlled And he still has significant numbness and sensory loss, getting relief with taking 5 tablets of gabapentin daily in divided doses  CKD, overall stable, has no microalbuminuria   Patient Instructions  Check blood sugars on waking up  3/7 days  Also check blood sugars about 2 hours after a meal and do this after different meals by rotation  Recommended blood sugar levels on waking up is 90-130 and about 2 hours after meal is 130-160  Please bring your blood sugar monitor to each visit, thank you       [facility] 09/07/2016, 9:18 PM

## 2016-09-08 DIAGNOSIS — M47816 Spondylosis without myelopathy or radiculopathy, lumbar region: Secondary | ICD-10-CM | POA: Diagnosis not present

## 2016-09-15 ENCOUNTER — Ambulatory Visit (INDEPENDENT_AMBULATORY_CARE_PROVIDER_SITE_OTHER): Payer: Medicare Other

## 2016-09-15 DIAGNOSIS — I4821 Permanent atrial fibrillation: Secondary | ICD-10-CM

## 2016-09-15 DIAGNOSIS — I4891 Unspecified atrial fibrillation: Secondary | ICD-10-CM | POA: Diagnosis not present

## 2016-09-15 DIAGNOSIS — I482 Chronic atrial fibrillation: Secondary | ICD-10-CM | POA: Diagnosis not present

## 2016-09-15 LAB — POCT INR: INR: 2.1

## 2016-09-27 ENCOUNTER — Encounter: Payer: Self-pay | Admitting: *Deleted

## 2016-09-27 ENCOUNTER — Other Ambulatory Visit: Payer: Self-pay | Admitting: *Deleted

## 2016-09-27 DIAGNOSIS — Z006 Encounter for examination for normal comparison and control in clinical research program: Secondary | ICD-10-CM

## 2016-09-27 MED ORDER — STUDY - INVESTIGATIONAL MEDICATION: 1.0000 | Freq: Every day | 99 refills | Status: DC

## 2016-09-27 NOTE — Progress Notes (Signed)
RESEARCH ENCOUNTER  Patient ID: [name]  DOB: 1940/12/12  [name] [facility] for Visit 2/Randomization visit of the [employer].  No signs/symptoms of ACS since the last visit. IP dispensed and given to patient along with identification card.  Patient and wife verbalized understanding of medication compliance.  EKG performed, labs collected, and subject completed questionnaires.  [facility] will follow up with [facility] in 2 weeks.

## 2016-09-28 ENCOUNTER — Other Ambulatory Visit: Payer: Self-pay | Admitting: Endocrinology

## 2016-09-28 ENCOUNTER — Other Ambulatory Visit: Payer: Self-pay | Admitting: Internal Medicine

## 2016-10-04 ENCOUNTER — Ambulatory Visit (HOSPITAL_BASED_OUTPATIENT_CLINIC_OR_DEPARTMENT_OTHER): Payer: Medicare Other | Admitting: Family

## 2016-10-04 ENCOUNTER — Other Ambulatory Visit (HOSPITAL_COMMUNITY): Payer: Self-pay | Admitting: *Deleted

## 2016-10-04 ENCOUNTER — Other Ambulatory Visit (HOSPITAL_BASED_OUTPATIENT_CLINIC_OR_DEPARTMENT_OTHER): Payer: Medicare Other

## 2016-10-04 ENCOUNTER — Telehealth (HOSPITAL_COMMUNITY): Payer: Self-pay | Admitting: *Deleted

## 2016-10-04 VITALS — BP 92/47 | HR 52 | Temp 97.5°F | Resp 17 | Wt 228.0 lb

## 2016-10-04 DIAGNOSIS — I5032 Chronic diastolic (congestive) heart failure: Secondary | ICD-10-CM

## 2016-10-04 DIAGNOSIS — E1151 Type 2 diabetes mellitus with diabetic peripheral angiopathy without gangrene: Secondary | ICD-10-CM

## 2016-10-04 DIAGNOSIS — D45 Polycythemia vera: Secondary | ICD-10-CM

## 2016-10-04 DIAGNOSIS — IMO0002 Reserved for concepts with insufficient information to code with codable children: Secondary | ICD-10-CM

## 2016-10-04 DIAGNOSIS — E1165 Type 2 diabetes mellitus with hyperglycemia: Secondary | ICD-10-CM

## 2016-10-04 LAB — CMP (CANCER CENTER ONLY)
ALBUMIN: 3.2 g/dL — AB (ref 3.3–5.5)
ALT(SGPT): 13 U/L (ref 10–47)
AST: 16 U/L (ref 11–38)
Alkaline Phosphatase: 62 U/L (ref 26–84)
BILIRUBIN TOTAL: 0.9 mg/dL (ref 0.20–1.60)
BUN, Bld: 43 mg/dL — ABNORMAL HIGH (ref 7–22)
CALCIUM: 9 mg/dL (ref 8.0–10.3)
CHLORIDE: 104 meq/L (ref 98–108)
CO2: 27 meq/L (ref 18–33)
CREATININE: 2 mg/dL — AB (ref 0.6–1.2)
Glucose, Bld: 213 mg/dL — ABNORMAL HIGH (ref 73–118)
Potassium: 4.5 mEq/L (ref 3.3–4.7)
SODIUM: 139 meq/L (ref 128–145)
TOTAL PROTEIN: 6 g/dL — AB (ref 6.4–8.1)

## 2016-10-04 LAB — CBC WITH DIFFERENTIAL (CANCER CENTER ONLY)
BASO#: 0 10*3/uL (ref 0.0–0.2)
BASO%: 0.4 % (ref 0.0–2.0)
EOS ABS: 0.1 10*3/uL (ref 0.0–0.5)
EOS%: 1.3 % (ref 0.0–7.0)
HEMATOCRIT: 45.2 % (ref 38.7–49.9)
HEMOGLOBIN: 15.1 g/dL (ref 13.0–17.1)
LYMPH#: 0.7 10*3/uL — AB (ref 0.9–3.3)
LYMPH%: 9.2 % — ABNORMAL LOW (ref 14.0–48.0)
MCH: 31.2 pg (ref 28.0–33.4)
MCHC: 33.4 g/dL (ref 32.0–35.9)
MCV: 93 fL (ref 82–98)
MONO#: 0.5 10*3/uL (ref 0.1–0.9)
MONO%: 6.4 % (ref 0.0–13.0)
NEUT%: 82.7 % — ABNORMAL HIGH (ref 40.0–80.0)
NEUTROS ABS: 6.3 10*3/uL (ref 1.5–6.5)
Platelets: 122 10*3/uL — ABNORMAL LOW (ref 145–400)
RBC: 4.84 10*6/uL (ref 4.20–5.70)
RDW: 17.1 % — ABNORMAL HIGH (ref 11.1–15.7)
WBC: 7.6 10*3/uL (ref 4.0–10.0)

## 2016-10-04 NOTE — Progress Notes (Signed)
Hematology and Oncology Follow Up Visit  Friend "[name] [id number] 07/13/1940 76 y.o. 10/04/2016   Principle Diagnosis:  Polycythemia vera  Current Therapy:   Phlebotomy to maintain hematocrit below 45%. Aspirin 81 mg by mouth weekly Coumadin for chronic atrial fibrillation     Interim History:  [name] is here today with his wife for follow-up. He is doing well and has no complaints at this time. He had a radio frequency ablation and since then states that he has felt much better. He denies fatigue.  He is currently enrolled in the [place] trial for patients with heart failure. He will be having a cardiomems pressure sensor in August by Dr. Aundra Dubin in August.  No fever, chills, n/v, cough, rash, dizziness, SOB, chest pain, palpitations, abdominal pain or changes in bowel or bladder habits.  No bleeding or petechiae. He does bruise easily on Coumadin. No lymphadenopathy found on exam.  No swelling or tenderness in his extremities. The neuropathy in his feet is unchanged.  He states that his blood sugars have been stable. He has maintained a good appetite and is staying well hydrated. His weight is stable.   ECOG Performance Status: 0 - Asymptomatic  Medications:  Allergies as of 10/04/2016      Reactions   Avelox [moxifloxacin Hcl In Nacl] Swelling, Rash, Other (See Comments)   Patient became hypotensive after infusion started Because of a history of documented adverse serious drug reaction;Medi Alert bracelet  is recommended   Penicillins Anaphylaxis, Other (See Comments), Swelling   REACTION: anaphylaxis Because of a history of documented adverse serious drug reaction;Medi Alert bracelet  is recommended      Medication List       Accurate as of 10/04/16  1:09 PM. Always use your most recent med list.          ACCU-CHEK AVIVA PLUS test strip Generic drug:  glucose blood USE ONE STRIP TO CHECK GLUCOSE THREE TIMES DAILY AS DIRECTED   ACCU-CHEK FASTCLIX  LANCETS Misc USE ONE LANCET TO CHECK GLUCOSE THREE TIMES DAILY   acetaminophen 325 MG tablet Commonly known as:  TYLENOL Take 325 mg by mouth every 4 (four) hours as needed for moderate pain.   Albuterol Sulfate 108 (90 Base) MCG/ACT Aepb Commonly known as:  PROAIR RESPICLICK Inhale 2 puffs into the lungs every 6 (six) hours as needed. For wheezing or shortness of breath.   allopurinol 100 MG tablet Commonly known as:  ZYLOPRIM TAKE TWO TABLETS BY MOUTH ONCE DAILY   atorvastatin 10 MG tablet Commonly known as:  LIPITOR Take 1 tablet (10 mg total) by mouth daily. Annual appt w/labs is due must see MD for refills   carboxymethylcellulose 0.5 % Soln Commonly known as:  REFRESH PLUS Place 2 drops into both eyes daily.   carvedilol 12.5 MG tablet Commonly known as:  COREG Take 1 tablet (12.5 mg total) by mouth 2 (two) times daily.   Cholecalciferol 1000 units tablet Take 1,000 Units by mouth daily.   colchicine 0.6 MG tablet Take 0.6 mg by mouth daily as needed (gout).   digoxin 0.125 MG tablet Commonly known as:  LANOXIN Take 1 tablet (0.125 mg total) by mouth daily.   gabapentin 300 MG capsule Commonly known as:  NEURONTIN TAKE ONE CAPSULE BY MOUTH 4 TIMES DAILY AS NEEDED FOR LEG PAIN   Investigational - Study Medication Take 1 tablet by mouth daily. Study name: [place] Additional study details: MK-1242 (vericiguat) or Placebo   ipratropium 0.06 %  nasal spray Commonly known as:  ATROVENT Place 2 sprays into both nostrils daily.   LEVEMIR 100 UNIT/ML injection Generic drug:  insulin detemir Inject 5 Units into the skin 2 (two) times daily.   methocarbamol 750 MG tablet Commonly known as:  ROBAXIN Take 750 mg by mouth 2 (two) times daily as needed for muscle spasms.   nitroGLYCERIN 0.4 MG SL tablet Commonly known as:  NITROSTAT Place 1 tablet (0.4 mg total) under the tongue every 5 (five) minutes as needed for chest pain (MAX 3 TABLETS).   NOVOLIN R 100  units/mL injection Generic drug:  insulin regular Inject 5 Units into the skin daily. Takes this at dinner   omeprazole 20 MG capsule Commonly known as:  PRILOSEC Take 20 mg by mouth 2 (two) times daily before a meal.   potassium chloride SA 20 MEQ tablet Commonly known as:  K-DUR,KLOR-CON Take 1 tablet (20 mEq total) by mouth daily.   sacubitril-valsartan 49-51 MG Commonly known as:  ENTRESTO Take 1 tablet by mouth 2 (two) times daily.   spironolactone 25 MG tablet Commonly known as:  ALDACTONE Take 1 tablet (25 mg total) by mouth daily.   torsemide 20 MG tablet Commonly known as:  DEMADEX Take 4 tablets (80 mg total) by mouth daily.   traMADol 50 MG tablet Commonly known as:  ULTRAM Take 100 mg by mouth every 8 (eight) hours as needed (pain).   warfarin 2.5 MG tablet Commonly known as:  COUMADIN Take 1 tablet (2.5 mg total) by mouth See admin instructions.       Allergies:  Allergies  Allergen Reactions  . Avelox [Moxifloxacin Hcl In Nacl] Swelling, Rash and Other (See Comments)    Patient became hypotensive after infusion started Because of a history of documented adverse serious drug reaction;Medi Alert bracelet  is recommended  . Penicillins Anaphylaxis, Other (See Comments) and Swelling    REACTION: anaphylaxis Because of a history of documented adverse serious drug reaction;Medi Alert bracelet  is recommended    Past Medical History, Surgical history, Social history, and Family History were reviewed and updated.  Review of Systems: All other 10 point review of systems is negative.   Physical Exam:  vitals were not taken for this visit.  Wt Readings from Last 3 Encounters:  09/27/16 219 lb (99.3 kg)  09/07/16 236 lb (107 kg)  08/10/16 231 lb 8 oz (105 kg)    Ocular: Sclerae unicteric, pupils equal, round and reactive to light Ear-nose-throat: Oropharynx clear, dentition fair Lymphatic: No cervical, supraclavicular or axillary adenopathy Lungs no  rales or rhonchi, good excursion bilaterally Heart regular rate and rhythm, no murmur appreciated Abd soft, nontender, positive bowel sounds, no liver or spleen tip palpated on exam, no fluid wave MSK no focal spinal tenderness, no joint edema Neuro: non-focal, well-oriented, appropriate affect Breasts: Deferred   Lab Results  Component Value Date   WBC 7.6 10/04/2016   HGB 15.1 10/04/2016   HCT 45.2 10/04/2016   MCV 93 10/04/2016   PLT 122 (L) 10/04/2016   Lab Results  Component Value Date   FERRITIN 47 08/01/2016   IRON 116 08/01/2016   TIBC 386 08/01/2016   UIBC 270 08/01/2016   IRONPCTSAT 30 08/01/2016   Lab Results  Component Value Date   RETICCTPCT 1.4 04/02/2014   RBC 4.84 10/04/2016   RETICCTABS 75.2 04/02/2014   No results found for: KPAFRELGTCHN, LAMBDASER, KAPLAMBRATIO No results found for: IGGSERUM, IGA, IGMSERUM No results found for: TOTALPROTELP, ALBUMINELP, A1GS,  Nelida Meuse, SPEI   Chemistry      Component Value Date/Time   NA 139 09/02/2016 1439   NA 142 08/01/2016 1249   NA 141 03/09/2016 1320   K 4.7 09/02/2016 1439   K 4.7 08/01/2016 1249   K 4.5 03/09/2016 1320   CL 104 09/02/2016 1439   CL 107 08/01/2016 1249   CO2 27 09/02/2016 1439   CO2 27 08/01/2016 1249   CO2 25 03/09/2016 1320   BUN 34 (H) 09/02/2016 1439   BUN 29 (H) 08/01/2016 1249   BUN 28.8 (H) 03/09/2016 1320   CREATININE 1.69 (H) 09/02/2016 1439   CREATININE 1.7 (H) 08/01/2016 1249   CREATININE 1.9 (H) 03/09/2016 1320      Component Value Date/Time   CALCIUM 8.9 09/02/2016 1439   CALCIUM 9.4 08/01/2016 1249   CALCIUM 9.2 03/09/2016 1320   ALKPHOS 56 09/02/2016 1439   ALKPHOS 69 08/01/2016 1249   ALKPHOS 97 03/09/2016 1320   AST 10 09/02/2016 1439   AST 17 08/01/2016 1249   AST 13 03/09/2016 1320   ALT 9 09/02/2016 1439   ALT 21 08/01/2016 1249   ALT 10 03/09/2016 1320   BILITOT 0.5 09/02/2016 1439   BILITOT 1.00 08/01/2016 1249   BILITOT 0.88  03/09/2016 1320      Impression and Plan: Mr. Makara is a very pleasant 76 yo caucasian gentleman with polycythemia. He is doing well and has no complaints at this time. He took his antihypertensives before coming in today and BP is 92/47, HR 52. His Hct is 45.2.  We will hold off on phlebotomizing him today and plan to see him back again in 1 month for repeat lab work and follow-up.  Both he and his wife promise to contact our office with any questions or concerns. We can certainly see him sooner if need be.   Eliezer Bottom, NP 7/17/20181:09 PM

## 2016-10-04 NOTE — Telephone Encounter (Signed)
Pt's Cardiomems was approved by his Insurance, [employer] # [id number] valid 09/12/16 through 12/11/16.    Scheduled pt for implant 10/19/16 at 1pm, per Dr Aundra Dubin pt needs to hold Coumadin 3 days prior.  Pt's wife aware of date/time/instructions.

## 2016-10-05 LAB — IRON AND TIBC
%SAT: 33 % (ref 20–55)
Iron: 109 ug/dL (ref 42–163)
TIBC: 336 ug/dL (ref 202–409)
UIBC: 227 ug/dL (ref 117–376)

## 2016-10-05 LAB — FERRITIN: Ferritin: 56 ng/ml (ref 22–316)

## 2016-10-10 ENCOUNTER — Encounter: Payer: Self-pay | Admitting: *Deleted

## 2016-10-10 DIAGNOSIS — Z006 Encounter for examination for normal comparison and control in clinical research program: Secondary | ICD-10-CM

## 2016-10-10 NOTE — Progress Notes (Signed)
RESEARCH ENCOUNTER  Patient ID: Coden "[name]" [name]  DOB: 06-30-1940  [name] presented to the [facility] for Visit 3 (Day 14) visit of the [place] HF [employer].  No signs/symptoms of ACS since the last visit. Participant compliant with IP, IP returned and additional IP dispensed.  Participant and wife very pleasant.  Stated they are looking forward to the cardiomems implant on August 1st.    Patient will follow up with [facility] in ~14 days.

## 2016-10-11 ENCOUNTER — Ambulatory Visit: Payer: Medicare Other | Admitting: Internal Medicine

## 2016-10-13 ENCOUNTER — Telehealth: Payer: Self-pay

## 2016-10-13 NOTE — Telephone Encounter (Signed)
Call from patient's wife Joaquim Lai, she states [name]'s blood pressure has been dropping after lunch, remains low for a couple of hours and then returns to 110's; his normal. Prior to this morning's medications his BP was 151/78, HR 65 at 2:30 pm 83/50, HR 50. He states he felt "weak" for a few minutes and then everything was "ok".  Currently BP is 113/56 HR 58 and he feel fine. Verb they will seek emergency medical help if necessary. Dr. Aundra Dubin notified and writer will follow up with patient.

## 2016-10-14 ENCOUNTER — Encounter: Payer: Self-pay | Admitting: *Deleted

## 2016-10-14 DIAGNOSIS — Z006 Encounter for examination for normal comparison and control in clinical research program: Secondary | ICD-10-CM

## 2016-10-14 NOTE — Progress Notes (Signed)
RESEARCH ENCOUNTER  Patient ID: [name]  DOB: December 10, 1940  [name] presented to the [facility] for an Unscheduled Visit of the [employer].  No signs/symptoms of ACS since the last visit. Participant states compliance with IP.  MD Mclean aware of patient symptoms and recommended decreasing the dose of IP.  IP returned (5mg  or placebo) and additional IP dispensed at a decreased dose (2.5mg  or placebo).  Reviewed BPs that the participant recorded over the past few days; the lowest was systolic in the mid 83M but varied throughout the day and low BPs were not sustained for long periods.  [name] stated that he felt like he had a "light shining in his eyes" for a brief moment "only once" but he did not have any syncopal episodes.  Advised participant and wife to seek medical attention if needed and to contact the [facility] if any additional concerns/questions about the study.

## 2016-10-19 ENCOUNTER — Ambulatory Visit (HOSPITAL_COMMUNITY)
Admission: RE | Admit: 2016-10-19 | Discharge: 2016-10-19 | Disposition: A | Discharge status: Home / Self Care | Payer: Medicare Other | Source: Ambulatory Visit | Attending: Cardiology | Admitting: Cardiology

## 2016-10-19 ENCOUNTER — Encounter (HOSPITAL_COMMUNITY)
Admission: RE | Disposition: A | Discharge status: Home / Self Care | Payer: Self-pay | Source: Ambulatory Visit | Attending: Cardiology

## 2016-10-19 DIAGNOSIS — Z8249 Family history of ischemic heart disease and other diseases of the circulatory system: Secondary | ICD-10-CM | POA: Insufficient documentation

## 2016-10-19 DIAGNOSIS — Z951 Presence of aortocoronary bypass graft: Secondary | ICD-10-CM | POA: Insufficient documentation

## 2016-10-19 DIAGNOSIS — Z833 Family history of diabetes mellitus: Secondary | ICD-10-CM | POA: Insufficient documentation

## 2016-10-19 DIAGNOSIS — I130 Hypertensive heart and chronic kidney disease with heart failure and stage 1 through stage 4 chronic kidney disease, or unspecified chronic kidney disease: Secondary | ICD-10-CM | POA: Insufficient documentation

## 2016-10-19 DIAGNOSIS — Z794 Long term (current) use of insulin: Secondary | ICD-10-CM | POA: Insufficient documentation

## 2016-10-19 DIAGNOSIS — I252 Old myocardial infarction: Secondary | ICD-10-CM | POA: Insufficient documentation

## 2016-10-19 DIAGNOSIS — I340 Nonrheumatic mitral (valve) insufficiency: Secondary | ICD-10-CM | POA: Insufficient documentation

## 2016-10-19 DIAGNOSIS — Z95810 Presence of automatic (implantable) cardiac defibrillator: Secondary | ICD-10-CM | POA: Diagnosis not present

## 2016-10-19 DIAGNOSIS — I5032 Chronic diastolic (congestive) heart failure: Secondary | ICD-10-CM

## 2016-10-19 DIAGNOSIS — I2510 Atherosclerotic heart disease of native coronary artery without angina pectoris: Secondary | ICD-10-CM | POA: Diagnosis not present

## 2016-10-19 DIAGNOSIS — J449 Chronic obstructive pulmonary disease, unspecified: Secondary | ICD-10-CM | POA: Diagnosis not present

## 2016-10-19 DIAGNOSIS — Z7901 Long term (current) use of anticoagulants: Secondary | ICD-10-CM | POA: Insufficient documentation

## 2016-10-19 DIAGNOSIS — E1122 Type 2 diabetes mellitus with diabetic chronic kidney disease: Secondary | ICD-10-CM | POA: Insufficient documentation

## 2016-10-19 DIAGNOSIS — E785 Hyperlipidemia, unspecified: Secondary | ICD-10-CM | POA: Diagnosis not present

## 2016-10-19 DIAGNOSIS — I5022 Chronic systolic (congestive) heart failure: Secondary | ICD-10-CM | POA: Diagnosis not present

## 2016-10-19 DIAGNOSIS — Z87891 Personal history of nicotine dependence: Secondary | ICD-10-CM | POA: Insufficient documentation

## 2016-10-19 DIAGNOSIS — I255 Ischemic cardiomyopathy: Secondary | ICD-10-CM | POA: Diagnosis not present

## 2016-10-19 DIAGNOSIS — I509 Heart failure, unspecified: Secondary | ICD-10-CM | POA: Diagnosis not present

## 2016-10-19 DIAGNOSIS — N183 Chronic kidney disease, stage 3 (moderate): Secondary | ICD-10-CM | POA: Diagnosis not present

## 2016-10-19 HISTORY — PX: RIGHT HEART CATH: CATH118263

## 2016-10-19 LAB — POCT I-STAT 3, VENOUS BLOOD GAS (G3P V)
ACID-BASE DEFICIT: 3 mmol/L — AB (ref 0.0–2.0)
Bicarbonate: 21.8 mmol/L (ref 20.0–28.0)
Bicarbonate: 25.3 mmol/L (ref 20.0–28.0)
O2 SAT: 68 %
O2 Saturation: 67 %
PH VEN: 7.373 (ref 7.250–7.430)
TCO2: 23 mmol/L (ref 0–100)
TCO2: 27 mmol/L (ref 0–100)
pCO2, Ven: 38.3 mmHg — ABNORMAL LOW (ref 44.0–60.0)
pCO2, Ven: 43.4 mmHg — ABNORMAL LOW (ref 44.0–60.0)
pH, Ven: 7.362 (ref 7.250–7.430)
pO2, Ven: 36 mmHg (ref 32.0–45.0)
pO2, Ven: 36 mmHg (ref 32.0–45.0)

## 2016-10-19 LAB — BASIC METABOLIC PANEL
Anion gap: 10 (ref 5–15)
BUN: 41 mg/dL — AB (ref 6–20)
CO2: 24 mmol/L (ref 22–32)
Calcium: 8.9 mg/dL (ref 8.9–10.3)
Chloride: 105 mmol/L (ref 101–111)
Creatinine, Ser: 1.56 mg/dL — ABNORMAL HIGH (ref 0.61–1.24)
GFR calc Af Amer: 48 mL/min — ABNORMAL LOW (ref >60)
GFR, EST NON AFRICAN AMERICAN: 42 mL/min — AB (ref >60)
Glucose, Bld: 162 mg/dL — ABNORMAL HIGH (ref 65–99)
POTASSIUM: 4.3 mmol/L (ref 3.5–5.1)
SODIUM: 139 mmol/L (ref 135–145)

## 2016-10-19 LAB — CBC
HEMATOCRIT: 44 % (ref 39.0–52.0)
Hemoglobin: 14.3 g/dL (ref 13.0–17.0)
MCH: 29.6 pg (ref 26.0–34.0)
MCHC: 32.5 g/dL (ref 30.0–36.0)
MCV: 91.1 fL (ref 78.0–100.0)
PLATELETS: 125 10*3/uL — AB (ref 150–400)
RBC: 4.83 MIL/uL (ref 4.22–5.81)
RDW: 16.3 % — AB (ref 11.5–15.5)
WBC: 6.6 10*3/uL (ref 4.0–10.5)

## 2016-10-19 LAB — GLUCOSE, CAPILLARY
GLUCOSE-CAPILLARY: 154 mg/dL — AB (ref 65–99)
GLUCOSE-CAPILLARY: 140 mg/dL — AB (ref 65–99)

## 2016-10-19 LAB — PROTIME-INR
INR: 1.6
Prothrombin Time: 19.2 seconds — ABNORMAL HIGH (ref 11.4–15.2)

## 2016-10-19 SURGERY — PRESSURE SENSOR/CARDIOMEMS
Anesthesia: LOCAL

## 2016-10-19 MED ORDER — SODIUM CHLORIDE 0.9% FLUSH: 3.0000 mL | Freq: Two times a day (BID) | INTRAVENOUS | Status: DC

## 2016-10-19 MED ORDER — FENTANYL CITRATE (PF) 100 MCG/2ML IJ SOLN
INTRAMUSCULAR | Status: AC
  Filled 2016-10-19: qty 2

## 2016-10-19 MED ORDER — LIDOCAINE HCL (PF) 1 % IJ SOLN
INTRAMUSCULAR | Status: AC
  Filled 2016-10-19: qty 30

## 2016-10-19 MED ORDER — SODIUM CHLORIDE 0.9 % IV SOLN
INTRAVENOUS | Status: DC
  Administered 2016-10-19: 12:00:00 via INTRAVENOUS

## 2016-10-19 MED ORDER — FENTANYL CITRATE (PF) 100 MCG/2ML IJ SOLN
INTRAMUSCULAR | Status: DC | PRN
  Administered 2016-10-19: 25 ug via INTRAVENOUS

## 2016-10-19 MED ORDER — ACETAMINOPHEN 325 MG PO TABS: 650.0000 mg | ORAL_TABLET | ORAL | Status: DC | PRN

## 2016-10-19 MED ORDER — SODIUM CHLORIDE 0.9% FLUSH: 3.0000 mL | INTRAVENOUS | Status: DC | PRN

## 2016-10-19 MED ORDER — HEPARIN (PORCINE) IN NACL 2-0.9 UNIT/ML-% IJ SOLN
INTRAMUSCULAR | Status: AC | PRN
  Administered 2016-10-19: 1500 mL

## 2016-10-19 MED ORDER — IOPAMIDOL (ISOVUE-370) INJECTION 76%
INTRAVENOUS | Status: DC | PRN
  Administered 2016-10-19: 10 mL via INTRA_ARTERIAL

## 2016-10-19 MED ORDER — HEPARIN (PORCINE) IN NACL 2-0.9 UNIT/ML-% IJ SOLN
INTRAMUSCULAR | Status: AC
  Filled 2016-10-19: qty 1500

## 2016-10-19 MED ORDER — MIDAZOLAM HCL 2 MG/2ML IJ SOLN
INTRAMUSCULAR | Status: DC | PRN
  Administered 2016-10-19: 1 mg via INTRAVENOUS

## 2016-10-19 MED ORDER — SODIUM CHLORIDE 0.9 % IV SOLN: 250.0000 mL | INTRAVENOUS | Status: DC | PRN

## 2016-10-19 MED ORDER — MIDAZOLAM HCL 2 MG/2ML IJ SOLN
INTRAMUSCULAR | Status: AC
  Filled 2016-10-19: qty 2

## 2016-10-19 MED ORDER — ONDANSETRON HCL 4 MG/2ML IJ SOLN: 4.0000 mg | Freq: Four times a day (QID) | INTRAMUSCULAR | Status: DC | PRN

## 2016-10-19 MED ORDER — LIDOCAINE HCL (PF) 1 % IJ SOLN
INTRAMUSCULAR | Status: DC | PRN
  Administered 2016-10-19: 20 mL

## 2016-10-19 MED ORDER — ASPIRIN 81 MG PO CHEW
CHEWABLE_TABLET | ORAL | Status: AC
  Administered 2016-10-19: 81 mg via ORAL
  Filled 2016-10-19: qty 1

## 2016-10-19 MED ORDER — IOPAMIDOL (ISOVUE-370) INJECTION 76%
INTRAVENOUS | Status: AC
  Filled 2016-10-19: qty 50

## 2016-10-19 MED ORDER — ASPIRIN 81 MG PO CHEW
81.0000 mg | CHEWABLE_TABLET | ORAL | Status: AC
  Administered 2016-10-19: 81 mg via ORAL

## 2016-10-19 SURGICAL SUPPLY — 12 items
CARDIOMEMS PA SENSOR W/DELIVER (Prosthesis & Implant Heart) ×2 IMPLANT
CATH SWAN GANZ 7F STRAIGHT (CATHETERS) ×1 IMPLANT
KIT HEART LEFT (KITS) ×2 IMPLANT
PACK CARDIAC CATHETERIZATION (CUSTOM PROCEDURE TRAY) ×2 IMPLANT
SENSOR CARDIOMEMS PA W/DELIVER (Prosthesis & Implant Heart) IMPLANT
SHEATH FAST CATH 12F 12CM (SHEATH) ×2 IMPLANT
SHEATH PINNACLE 7F 10CM (SHEATH) ×1 IMPLANT
SHEATH PINNACLE 9F 10CM (SHEATH) ×1 IMPLANT
TRANSDUCER W/STOPCOCK (MISCELLANEOUS) ×2 IMPLANT
TUBING CIL FLEX 10 FLL-RA (TUBING) ×2 IMPLANT
WIRE EMERALD 3MM-J .025X260CM (WIRE) ×1 IMPLANT
WIRE G V18X300CM (WIRE) ×1 IMPLANT

## 2016-10-19 NOTE — Progress Notes (Addendum)
Site area: Right groin a 12 french venous sheath was removed  Site Prior to Removal:  Level 0  Pressure Applied For 30 MINUTES    Bedrest Beginning at 1605p  Manual:   Yes.    Patient Status During Pull:  stable  Post Pull Groin Site:  Level 0  Post Pull Instructions Given:  Yes.    Post Pull Pulses Present:  Yes.    Dressing Applied:  Yes.    Comments:  VS remain stable during sheath pull.

## 2016-10-19 NOTE — H&P (Signed)
Physician History and Physical    [name] MRN: [id number] DOB/AGE: 11-22-1940 76 y.o. Admit date: 10/19/2016   HPI: 76 yo with ischemic CMP/chronic systolic CHF presents for Cardiomems placement.   Past Medical History:  Diagnosis Date  . AAA (abdominal aortic aneurysm) ([place])    Surgical repair 11/2002.  . Adenomatous colon polyp   . Alcohol ingestion of more than four drinks per week    Excess beer  not a dependency problem  . Aortic valve sclerosis   . Arthritis   . Atrial fibrillation ([place])    Previous long-term amiodarone therapy with multiple cardioversions / amiodarone stopped September, 2009  . Atrial flutter [facility])    Started November, 2010, Left-sided and cannot ablate  . Bony abnormality    Patient's manubrium is slightly displaced to the right  . CAD (coronary artery disease)    a. s/p CABG 2004;  b. 02/2016 Cath: LM nl, LAD 70p, 155m, D1 50, D2 50, RI 50ost, LCX 4m, OM2 100, OM3 100, RCA 70p, 100d, VG->OM3 ok, LIMA->LAD 60ost, VG->RPDA  ok.  . 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 01/2013 MDT DTBA 1D1 [place] CRT-D, ser # [id number].  . Carotid artery disease ([place])    Doppler, December, 2013, 0-39% bilateral  . Chronic systolic CHF (congestive heart failure) ([place])    a. 02/2016 Echo: EF 35-40%, diff HK, triv AI, mildly dil Ao root 66mm, mild MR, sev dil LA, triv TR.  Marland Kitchen Chronotropic incompetence    IV pacing rate adjusted  . CKD (chronic kidney disease), stage III   . COPD (chronic obstructive pulmonary disease) ([place])   . Dilated aortic root ([place])   . Discolored skin   . Diverticulosis   . Drug therapy    Redness and swelling with Avelox infusion May 24, 2011  . Eye abnormality    Ophthalmologist questions a clot in one of his eyes, May, 2012  . Gout   . Hyperlipidemia   . Hypertension   . Internal hemorrhoids   . Ischemic cardiomyopathy   . Left atrial thrombus    Remote past... cardioversions done since  that time  . Mitral regurgitation    Mild echo  . Myocardial infarction ([place])   . Nasal drainage    Chronic  . Overweight(278.02)   . Pericardial effusion   . Pleural effusion    Large loculated effusion on the left side November, 2011. This was tapped. It was exudative. Cytology revealed no cancer no proof of mesothelioma area pulmonary team felt that no further workup was needed  . Pleural thickening   . Pneumonia   . Polycythemia vera ([place]) 07/28/2014  . SOB (shortness of breath)    Large left effusion/ thoracentesis/hospitalization/November, 2011... exudated.. cytology negative.. Dr.Wert.. no proof of mesothelioma  . Spinal stenosis    Surgery Dr.Elsner  . Thrombophlebitis of superficial veins of upper extremities    Possible venous stenosis from defibrillator  . Type II diabetes mellitus ([place]) 2008  . Venous insufficiency    Toe discoloration chronic  . Ventral hernia    April, 2014, result of his abdominal surgery  . Warfarin anticoagulation   . Wide-complex tachycardia (HCC)      Review of systems complete and found to be negative unless listed above   Family History  Problem Relation Age of Onset  . Hypertension Mother   . Stroke Mother   . Diabetes Father   . Coronary artery disease Father   .  Other Father        DVT  . Diabetes Brother   . Colon cancer Neg Hx   . Heart attack Neg Hx     Social History   Social History  . Marital status: Married    Spouse name: N/A  . Number of children: N/A  . Years of education: N/A   Occupational History  . Retired- Nurse, mental health    Social History Main Topics  . Smoking status: Former Smoker    Packs/day: 3.00    Years: 39.00    Types: Cigarettes    Start date: 05/24/1956    Quit date: 03/22/1995  . Smokeless tobacco: Never Used  . Alcohol use 1.2 oz/week    2 Cans of beer per week  . Drug use: No  . Sexual activity: No   Other Topics Concern  . Not on file   Social History Narrative  . No  narrative on file     Prescriptions Prior to Admission  Medication Sig Dispense Refill Last Dose  . allopurinol (ZYLOPRIM) 100 MG tablet TAKE TWO TABLETS BY MOUTH ONCE DAILY (Patient taking differently: TAKE TWO TABLETS BY MOUTH ONCE DAILY IN THE EVENING) 180 tablet 3 10/18/2016 at 1930  . atorvastatin (LIPITOR) 10 MG tablet Take 1 tablet (10 mg total) by mouth daily. Annual appt w/labs is due must see MD for refills 30 tablet 0 10/18/2016 at 1930  . carboxymethylcellulose (REFRESH PLUS) 0.5 % SOLN Place 2 drops into both eyes daily.    Past Month at Unknown time  . carvedilol (COREG) 12.5 MG tablet Take 1 tablet (12.5 mg total) by mouth 2 (two) times daily. 60 tablet 6 10/19/2016 at 0900  . Cholecalciferol 1000 units tablet Take 1,000 Units by mouth daily.   10/19/2016 at 0900  . digoxin (LANOXIN) 0.125 MG tablet Take 1 tablet (0.125 mg total) by mouth daily. 30 tablet 6 10/19/2016 at 0900  . gabapentin (NEURONTIN) 300 MG capsule TAKE ONE CAPSULE BY MOUTH 4 TIMES DAILY AS NEEDED FOR LEG PAIN (Patient taking differently: Take 600 mg by mouth in the moring, 600 mg at supper, and 300 mg at bedtime) 360 capsule 3 10/19/2016 at 0900  . insulin detemir (LEVEMIR) 100 UNIT/ML injection Inject 5 Units into the skin 2 (two) times daily.    10/18/2016 at 2130  . Investigational - Study Medication Take 1 tablet by mouth daily. Study name: [place] Additional study details: MK-1242 (vericiguat) or Placebo 1 each PRN 10/19/2016 at 0900  . potassium chloride SA (K-DUR,KLOR-CON) 20 MEQ tablet Take 1 tablet (20 mEq total) by mouth daily. 90 tablet 3 10/19/2016 at 0900  . sacubitril-valsartan (ENTRESTO) 49-51 MG Take 1 tablet by mouth 2 (two) times daily. 60 tablet 6 10/19/2016 at 0900  . spironolactone (ALDACTONE) 25 MG tablet Take 1 tablet (25 mg total) by mouth daily. 30 tablet 6 10/19/2016 at 0900  . torsemide (DEMADEX) 20 MG tablet Take 4 tablets (80 mg total) by mouth daily. 120 tablet 6 10/18/2016 at 0900  . warfarin  (COUMADIN) 2.5 MG tablet Take 1 tablet (2.5 mg total) by mouth See admin instructions. (Patient taking differently: Take 2.5 mg by mouth. Take 2.5 mg daily except Wednesday take 5mg  daily (take in the pm)) 120 tablet 1 10/15/2016  . ACCU-CHEK AVIVA PLUS test strip USE ONE STRIP TO CHECK GLUCOSE THREE TIMES DAILY AS DIRECTED 100 each 3 Taking  . ACCU-CHEK FASTCLIX LANCETS MISC USE ONE LANCET TO CHECK GLUCOSE THREE TIMES DAILY 102 each  5   . acetaminophen (TYLENOL) 325 MG tablet Take 325 mg by mouth 4 (four) times daily as needed for moderate pain.    More than a month at Unknown time  . Albuterol Sulfate (PROAIR RESPICLICK) 883 (90 Base) MCG/ACT AEPB Inhale 2 puffs into the lungs every 6 (six) hours as needed. For wheezing or shortness of breath. 1 each 0 More than a month at Unknown time  . colchicine 0.6 MG tablet Take 0.6 mg by mouth daily as needed (gout).    More than a month at Unknown time  . insulin regular (NOVOLIN R) 100 units/mL injection Inject 5 Units into the skin daily before supper.    10/17/2016 at Unknown time  . ipratropium (ATROVENT) 0.06 % nasal spray Place 2 sprays into both nostrils daily.    More than a month at Unknown time  . lidocaine (LIDODERM) 5 % Place 1 patch onto the skin daily as needed for pain.   More than a month at Unknown time  . methocarbamol (ROBAXIN) 750 MG tablet Take 750 mg by mouth 2 (two) times daily as needed for muscle spasms.   More than a month at Unknown time  . nitroGLYCERIN (NITROSTAT) 0.4 MG SL tablet Place 1 tablet (0.4 mg total) under the tongue every 5 (five) minutes as needed for chest pain (MAX 3 TABLETS). 25 tablet 3 More than a month at Unknown time  . omeprazole (PRILOSEC) 20 MG capsule Take 20 mg by mouth daily as needed (acid reflux).    More than a month at Unknown time  . traMADol (ULTRAM) 50 MG tablet Take 100 mg by mouth every 8 (eight) hours as needed for severe pain.    More than a month at Unknown time    Physical Exam: Blood pressure  118/75, pulse 95, temperature 97.6 F (36.4 C), temperature source Oral, height 6' (1.829 m), weight 223 lb (101.2 kg), SpO2 100 %.  General: NAD Neck: No JVD, no thyromegaly or thyroid nodule.  Lungs: Clear to auscultation bilaterally with normal respiratory effort. CV: Nondisplaced PMI.  Heart regular S1/S2, no S3/S4, 1/6 SEM RUSB.  No peripheral edema.  No carotid bruit.  Normal pedal pulses.  Abdomen: Soft, nontender, no hepatosplenomegaly, no distention.  Skin: Intact without lesions or rashes.  Neurologic: Alert and oriented x 3.  Psych: Normal affect. Extremities: No clubbing or cyanosis.  HEENT: Normal.   Labs:   Lab Results  Component Value Date   WBC 6.6 10/19/2016   HGB 14.3 10/19/2016   HCT 44.0 10/19/2016   MCV 91.1 10/19/2016   PLT 125 (L) 10/19/2016    Recent Labs Lab 10/19/16 1210  NA 139  K 4.3  CL 105  CO2 24  BUN 41*  CREATININE 1.56*  CALCIUM 8.9  GLUCOSE 162*   ASSESSMENT AND PLAN: 76 yo with ischemic CMP/chronic systolic CHF presents for Cardiomems placement.  Risks/benefits have been discussed with patient.  INR 1.6 today after holding warfarin.  Will resume warfarin after procedure.   Signed: Loralie Champagne 10/19/2016, 1:35 PM

## 2016-10-19 NOTE — Discharge Instructions (Signed)

## 2016-10-20 ENCOUNTER — Encounter (HOSPITAL_COMMUNITY): Payer: Self-pay | Admitting: Cardiology

## 2016-10-20 ENCOUNTER — Ambulatory Visit: Payer: Medicare Other | Admitting: Internal Medicine

## 2016-10-21 ENCOUNTER — Telehealth: Payer: Self-pay

## 2016-10-21 NOTE — Telephone Encounter (Signed)
Call from patient wife [name] stating patient continues to have low blood pressures with weakness in the early afternoons. Today @ 2pm BP 84/43 and he felt weak. The episodes are lasting 30 mins - 1 hour. Denies chest pain or any other ACS. Patient states decreasing Vericiguat (Study Mediation) and or Placebo 5mg  down to 2.5mg  every day on 10/14/16 has not changed the early afternoon low blood pressures. Dr. Aundra Dubin consulted and per [place] HF Study Protocol pg. 35 study drug will be interrupted. Patient verbalizes he will seek emergency assistance if needed. Very pleasant. Will follow up with [facility] on Monday.

## 2016-10-23 NOTE — Progress Notes (Deleted)
Subjective:    Patient ID: [name], male    DOB: 05/03/1940, 76 y.o.   MRN: [id number]  HPI He is here for a physical exam.     Medications and allergies reviewed with patient and updated if appropriate.  Patient Active Problem List   Diagnosis Date Noted  . Acute on chronic systolic (congestive) heart failure ([place]) 07/02/2016  . Acute bronchitis 05/29/2016  . Abnormal CXR 05/29/2016  . Effusion of left elbow 04/13/2016  . Acute on chronic congestive heart failure ([place]) 04/13/2016  . Angina pectoris ([place]) 03/15/2016  . History of gout 10/09/2015  . Polycythemia vera ([place]) 07/28/2014  . Ascending aortic aneurysm ([place]) 05/10/2014  . Hypoxia 05/05/2014  . COPD exacerbation ([place]) 05/05/2014  . Diabetes ([place]) 05/05/2014  . Chronic systolic CHF (congestive heart failure) ([place]) 03/07/2014  . History of DVT (deep vein thrombosis) 03/07/2014  . Cardiomyopathy, ischemic 02/05/2014  . Pleural plaque without asbestosis 01/23/2014  . Elevated hemoglobin ([place]) 01/14/2014  . CKD (chronic kidney disease) stage 3, GFR 30-59 ml/min 01/14/2014  . Asbestos exposure 01/14/2014  . Shortness of breath 01/01/2014  . Ejection fraction   . Hypopotassemia 12/20/2012  . ACE-inhibitor cough 10/04/2012  . Ventral hernia   . Bony abnormality   . Carotid artery disease ([place])   . Spinal cord stimulator status   . Peripheral vascular disease with claudication ([place]) 08/31/2011  . Atrial fibrillation with rapid ventricular response ([place])   . Spinal stenosis   . CAD (coronary artery disease)   . Venous insufficiency   . Mitral regurgitation   . Aortic valve sclerosis   . Alcohol ingestion of more than four drinks per week   . Chronotropic incompetence   . Thrombophlebitis of superficial veins of upper extremities   . Pericardial effusion   . Overweight(278.02)   . Pleural effusion   . Hyperlipidemia   . Hypertension   . Atrial flutter ([place])   . Left atrial thrombus   . AAA  (abdominal aortic aneurysm) ([place])   . S/P ICD (internal cardiac defibrillator) procedure   . SOB (shortness of breath)   . Warfarin anticoagulation   . Diabetic polyneuropathy ([place]) 01/07/2010  . DIVERTICULOSIS, COLON 06/10/2009  . Uncontrolled diabetes mellitus with peripheral circulatory disorder ([place]) 03/11/2009  . BACK PAIN, LUMBAR 06/12/2008  . 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 Acute gout 07/27/2006  . CORONARY ARTERY BYPASS GRAFT, HX OF 07/27/2006    Current Outpatient Prescriptions on File Prior to Visit  Medication Sig Dispense Refill  . ACCU-CHEK AVIVA PLUS test strip USE ONE STRIP TO CHECK GLUCOSE THREE TIMES DAILY AS DIRECTED 100 each 3  . ACCU-CHEK FASTCLIX LANCETS MISC USE ONE LANCET TO CHECK GLUCOSE THREE TIMES DAILY 102 each 5  . acetaminophen (TYLENOL) 325 MG tablet Take 325 mg by mouth 4 (four) times daily as needed for moderate pain.     . Albuterol Sulfate (PROAIR RESPICLICK) 956 (90 Base) MCG/ACT AEPB Inhale 2 puffs into the lungs every 6 (six) hours as needed. For wheezing or shortness of breath. 1 each 0  . allopurinol (ZYLOPRIM) 100 MG tablet TAKE TWO TABLETS BY MOUTH ONCE DAILY (Patient taking differently: TAKE TWO TABLETS BY MOUTH ONCE DAILY IN THE EVENING) 180 tablet 3  . atorvastatin (LIPITOR) 10 MG tablet Take 1 tablet (10 mg total) by mouth daily. Annual appt w/labs is due must see MD for refills 30 tablet 0  . carboxymethylcellulose (REFRESH PLUS) 0.5 % SOLN Place  2 drops into both eyes daily.     . carvedilol (COREG) 12.5 MG tablet Take 1 tablet (12.5 mg total) by mouth 2 (two) times daily. 60 tablet 6  . Cholecalciferol 1000 units tablet Take 1,000 Units by mouth daily.    . colchicine 0.6 MG tablet Take 0.6 mg by mouth daily as needed (gout).     Marland Kitchen digoxin (LANOXIN) 0.125 MG tablet Take 1 tablet (0.125 mg total) by mouth daily. 30 tablet 6  . gabapentin (NEURONTIN) 300 MG capsule TAKE ONE CAPSULE BY MOUTH 4 TIMES DAILY AS NEEDED FOR  LEG PAIN (Patient taking differently: Take 600 mg by mouth in the moring, 600 mg at supper, and 300 mg at bedtime) 360 capsule 3  . insulin detemir (LEVEMIR) 100 UNIT/ML injection Inject 5 Units into the skin 2 (two) times daily.     . insulin regular (NOVOLIN R) 100 units/mL injection Inject 5 Units into the skin daily before supper.     . Investigational - Study Medication Take 1 tablet by mouth daily. Study name: [place] Additional study details: [id number] (vericiguat) or Placebo 1 each PRN  . ipratropium (ATROVENT) 0.06 % nasal spray Place 2 sprays into both nostrils daily.     Marland Kitchen lidocaine (LIDODERM) 5 % Place 1 patch onto the skin daily as needed for pain.    . methocarbamol (ROBAXIN) 750 MG tablet Take 750 mg by mouth 2 (two) times daily as needed for muscle spasms.    . nitroGLYCERIN (NITROSTAT) 0.4 MG SL tablet Place 1 tablet (0.4 mg total) under the tongue every 5 (five) minutes as needed for chest pain (MAX 3 TABLETS). 25 tablet 3  . omeprazole (PRILOSEC) 20 MG capsule Take 20 mg by mouth daily as needed (acid reflux).     . potassium chloride SA (K-DUR,KLOR-CON) 20 MEQ tablet Take 1 tablet (20 mEq total) by mouth daily. 90 tablet 3  . sacubitril-valsartan (ENTRESTO) 49-51 MG Take 1 tablet by mouth 2 (two) times daily. 60 tablet 6  . spironolactone (ALDACTONE) 25 MG tablet Take 1 tablet (25 mg total) by mouth daily. 30 tablet 6  . torsemide (DEMADEX) 20 MG tablet Take 4 tablets (80 mg total) by mouth daily. 120 tablet 6  . traMADol (ULTRAM) 50 MG tablet Take 100 mg by mouth every 8 (eight) hours as needed for severe pain.     Marland Kitchen warfarin (COUMADIN) 2.5 MG tablet Take 1 tablet (2.5 mg total) by mouth See admin instructions. (Patient taking differently: Take 2.5 mg by mouth. Take 2.5 mg daily except Wednesday take 5mg  daily (take in the pm)) 120 tablet 1   No current facility-administered medications on file prior to visit.     Past Medical History:  Diagnosis Date  . AAA (abdominal  aortic aneurysm) ([place])    Surgical repair 11/2002.  . Adenomatous colon polyp   . Alcohol ingestion of more than four drinks per week    Excess beer  not a dependency problem  . Aortic valve sclerosis   . Arthritis   . Atrial fibrillation ([place])    Previous long-term amiodarone therapy with multiple cardioversions / amiodarone stopped September, 2009  . Atrial flutter [facility])    Started November, 2010, Left-sided and cannot ablate  . Bony abnormality    Patient's manubrium is slightly displaced to the right  . CAD (coronary artery disease)    a. s/p CABG 2004;  b. 02/2016 Cath: LM nl, LAD 70p, 177m, D1 50, D2 50, RI 50ost, LCX 29m,  OM2 100, OM3 100, RCA 70p, 100d, VG->OM3 ok, LIMA->LAD 60ost, VG->RPDA  ok.  . 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 01/2013 MDT DTBA 1D1 [place] CRT-D, ser # [id number].  . Carotid artery disease ([place])    Doppler, December, 2013, 0-39% bilateral  . Chronic systolic CHF (congestive heart failure) ([place])    a. 02/2016 Echo: EF 35-40%, diff HK, triv AI, mildly dil Ao root 17mm, mild MR, sev dil LA, triv TR.  Marland Kitchen Chronotropic incompetence    IV pacing rate adjusted  . CKD (chronic kidney disease), stage III   . COPD (chronic obstructive pulmonary disease) ([place])   . Dilated aortic root ([place])   . Discolored skin   . Diverticulosis   . Drug therapy    Redness and swelling with Avelox infusion May 24, 2011  . Eye abnormality    Ophthalmologist questions a clot in one of his eyes, May, 2012  . Gout   . Hyperlipidemia   . Hypertension   . Internal hemorrhoids   . Ischemic cardiomyopathy   . Left atrial thrombus    Remote past... cardioversions done since that time  . Mitral regurgitation    Mild echo  . Myocardial infarction ([place])   . Nasal drainage    Chronic  . Overweight(278.02)   . Pericardial effusion   . Pleural effusion    Large loculated effusion on the left side November, 2011. This was tapped. It was exudative.  Cytology revealed no cancer no proof of mesothelioma area pulmonary team felt that no further workup was needed  . Pleural thickening   . Pneumonia   . Polycythemia vera ([place]) 07/28/2014  . SOB (shortness of breath)    Large left effusion/ thoracentesis/hospitalization/November, 2011... exudated.. cytology negative.. Dr.Wert.. no proof of mesothelioma  . Spinal stenosis    Surgery Dr.Elsner  . Thrombophlebitis of superficial veins of upper extremities    Possible venous stenosis from defibrillator  . Type II diabetes mellitus ([place]) 2008  . Venous insufficiency    Toe discoloration chronic  . Ventral hernia    April, 2014, result of his abdominal surgery  . Warfarin anticoagulation   . Wide-complex tachycardia ([place])     Past Surgical History:  Procedure Laterality Date  . ABDOMINAL AORTIC ANEURYSM REPAIR  11/2002  . ANTERIOR CERVICAL DECOMP/DISCECTOMY FUSION  1995  . BACK SURGERY    . BI-VENTRICULAR PACEMAKER INSERTION (CRT-P)  02-11-2013   Pt with previously implanted MDT CRTD downgraded to CRTP by Dr Lovena Le 02-11-13  . CARDIAC CATHETERIZATION  "several"  . CARDIAC CATHETERIZATION N/A 03/18/2016   Procedure: RIGHT/LEFT HEART CATH AND CORONARY/GRAFT ANGIOGRAPHY;  Surgeon: Nelva Bush, MD;  Location: [facility];  Service: Cardiovascular;  Laterality: N/A;  . CATARACT EXTRACTION W/ INTRAOCULAR LENS  IMPLANT, BILATERAL    . COLONOSCOPY W/ POLYPECTOMY    . CORONARY ANGIOPLASTY WITH STENT PLACEMENT  2004  . CORONARY ARTERY BYPASS GRAFT  2004   CABG X4  . FRACTURE SURGERY    . IMPLANTABLE CARDIOVERTER DEFIBRILLATOR (ICD) GENERATOR CHANGE N/A 02/11/2013   Procedure: ICD GENERATOR CHANGE;  Surgeon: Evans Lance, MD;  Location[facility];  Service: Cardiovascular;  Laterality: N/A;  . INCISION AND DRAINAGE ABSCESS / HEMATOMA OF BURSA / KNEE / THIGH    . INSERT / REPLACE / REMOVE PACEMAKER  2009   original pacer/defibrillator; Dr. Lovena Le 2009... by the pacing  . LUMBAR  FUSION  2010  . ORIF TIBIA & FIBULA  FRACTURES Right 2000s  . PILONIDAL CYST EXCISION    . RIGHT HEART CATH N/A 10/19/2016   Procedure: Right Heart Cath;  Surgeon: [name], MD;  Location: [facility];  Service: Cardiovascular;  Laterality: N/A;  . SPINAL CORD STIMULATOR IMPLANT  12/2011  . SURGERY SCROTAL / TESTICULAR      Social History   Social History  . Marital status: Married    Spouse name: N/A  . Number of children: N/A  . Years of education: N/A   Occupational History  . Retired- Nurse, mental health    Social History Main Topics  . Smoking status: Former Smoker    Packs/day: 3.00    Years: 39.00    Types: Cigarettes    Start date: 05/24/1956    Quit date: 03/22/1995  . Smokeless tobacco: Never Used  . Alcohol use 1.2 oz/week    2 Cans of beer per week  . Drug use: No  . Sexual activity: No   Other Topics Concern  . Not on file   Social History Narrative  . No narrative on file    Family History  Problem Relation Age of Onset  . Hypertension Mother   . Stroke Mother   . Diabetes Father   . Coronary artery disease Father   . Other Father        DVT  . Diabetes Brother   . Colon cancer Neg Hx   . Heart attack Neg Hx     Review of Systems     Objective:  There were no vitals filed for this visit. There were no vitals filed for this visit. There is no height or weight on file to calculate BMI.  Wt Readings from Last 3 Encounters:  10/19/16 223 lb (101.2 kg)  10/14/16 227 lb (103 kg)  10/10/16 227 lb (103 kg)     Physical Exam Constitutional: He appears well-developed and well-nourished. No distress.  HENT:  Head: Normocephalic and atraumatic.  Right Ear: External ear normal.  Left Ear: External ear normal.  Mouth/Throat: Oropharynx is clear and moist.  Normal ear canals and TM b/l  Eyes: Conjunctivae and EOM are normal.  Neck: Neck supple. No tracheal deviation present. No thyromegaly present.  No carotid bruit    Cardiovascular: Normal rate, regular rhythm, normal heart sounds and intact distal pulses.   No murmur heard. Pulmonary/Chest: Effort normal and breath sounds normal. No respiratory distress. He has no wheezes. He has no rales.  Abdominal: Soft. He exhibits no distension. There is no tenderness.  Genitourinary: deferred  Musculoskeletal: He exhibits no edema.  Lymphadenopathy:   He has no cervical adenopathy.  Skin: Skin is warm and dry. He is not diaphoretic.  Psychiatric: He has a normal mood and affect. His behavior is normal.         Assessment & Plan:   Physical exam: Screening blood work ordered Immunizations Up to date, discussed shingrix Colonoscopy  Done 04/2015 - Up to date  Eye exams   Up to date  EKG  Just done 10/2016 Exercise  - none - has chronic back pain, unable to exercise Weight  - encouraged weight loss - work on eating since he is not able to exercise Skin     No concerns Substance abuse     none  See Problem List for Assessment and Plan of chronic medical problems.

## 2016-10-24 ENCOUNTER — Encounter: Payer: Medicare Other | Admitting: Internal Medicine

## 2016-10-25 ENCOUNTER — Telehealth (HOSPITAL_COMMUNITY): Payer: Self-pay | Admitting: *Deleted

## 2016-10-25 ENCOUNTER — Encounter: Payer: Self-pay | Admitting: *Deleted

## 2016-10-25 DIAGNOSIS — Z006 Encounter for examination for normal comparison and control in clinical research program: Secondary | ICD-10-CM

## 2016-10-25 MED ORDER — SACUBITRIL-VALSARTAN 24-26 MG PO TABS: 1.0000 | ORAL_TABLET | Freq: Two times a day (BID) | ORAL | 3 refills | Status: DC

## 2016-10-25 NOTE — Telephone Encounter (Signed)
Pt in for research appt today and c/o low BP and seeing bright lights.  BP log shows BP has been running in 80's daily.  Per Dr Aundra Dubin decrease Delene Loll to 24/26 mg BID.  Pt and wife aware, new rx sent in  Medication Samples have been provided to the patient.  Drug name: Delene Loll       Strength: 24/26        Qty: 2  LOT: [id number]  Exp.Date: 4/20  Dosing instructions: 1 tab Twice daily   The patient has been instructed regarding the correct time, dose, and frequency of taking this medication, including desired effects and most common side effects.   Alyas Creary 11:08 AM 10/25/2016

## 2016-10-25 NOTE — Telephone Encounter (Signed)
Since patient now in donut hole, have enrolled in PAN foundation so that he will have $800 to use toward his Entresto copays through 10/24/17. Verified with [place] that his Delene Loll went through for no charge to him.  Member ID: [id number] Group ID: [id number] RxBin ID: [id number] PCN: [id number] Eligibility Start Date: 07/27/2016 Eligibility End Date: 10/24/2017 Assistance Amount: $800.00    Doroteo Bradford K. Velva Harman, PharmD, BCPS, CPP Clinical Pharmacist Pager: [phone] Phone: [phone] 10/25/2016 12:17 PM

## 2016-10-25 NOTE — Progress Notes (Signed)
RESEARCH ENCOUNTER  Patient ID: [name]  DOB: 10/14/1940  [name] presented to the [facility] for Visit 4 (Day 28) of the [employer].  No signs/symptoms of ACS since the last visit.  Subjects wife called the Research Office on Friday August 3rd and IP was interrupted.  Subject has home BP readings in the mid to upper 80s sporadically during the day.  After discussion with the PI, [place], study IP will continue being interrupted and Entresto decreased.  IP returned.  Will follow up with subject in one week.   Patient will follow up with Research Clinic in 1 week.

## 2016-10-26 DIAGNOSIS — M47816 Spondylosis without myelopathy or radiculopathy, lumbar region: Secondary | ICD-10-CM | POA: Diagnosis not present

## 2016-10-27 ENCOUNTER — Ambulatory Visit (INDEPENDENT_AMBULATORY_CARE_PROVIDER_SITE_OTHER): Payer: Medicare Other

## 2016-10-27 DIAGNOSIS — I4891 Unspecified atrial fibrillation: Secondary | ICD-10-CM | POA: Diagnosis not present

## 2016-10-27 DIAGNOSIS — I4821 Permanent atrial fibrillation: Secondary | ICD-10-CM

## 2016-10-27 DIAGNOSIS — I482 Chronic atrial fibrillation: Secondary | ICD-10-CM

## 2016-10-27 LAB — POCT INR: INR: 2.4

## 2016-10-31 ENCOUNTER — Encounter: Payer: Self-pay | Admitting: *Deleted

## 2016-10-31 DIAGNOSIS — Z006 Encounter for examination for normal comparison and control in clinical research program: Secondary | ICD-10-CM

## 2016-10-31 NOTE — Progress Notes (Signed)
RESEARCH ENCOUNTER  Patient ID: MAOR MECKEL  DOB: July 12, 1940  [name] presented to the [facility] for an Unscheduled Visit of the [place] HF Research Study.  Neither signs/symptoms of ACS nor episodes of symptomatic hypotension since last visit. Per Dr. Aundra Dubin, subject restarted on 2.5 mg or placebo investigational product.  Will follow up with subject during Heart Failure Clinic appointment next week.

## 2016-11-02 ENCOUNTER — Ambulatory Visit (INDEPENDENT_AMBULATORY_CARE_PROVIDER_SITE_OTHER): Payer: Medicare Other | Admitting: *Deleted

## 2016-11-02 DIAGNOSIS — I482 Chronic atrial fibrillation: Secondary | ICD-10-CM | POA: Diagnosis not present

## 2016-11-02 DIAGNOSIS — I255 Ischemic cardiomyopathy: Secondary | ICD-10-CM | POA: Diagnosis not present

## 2016-11-02 DIAGNOSIS — I4821 Permanent atrial fibrillation: Secondary | ICD-10-CM

## 2016-11-02 DIAGNOSIS — I5022 Chronic systolic (congestive) heart failure: Secondary | ICD-10-CM

## 2016-11-02 NOTE — Progress Notes (Signed)
Remote defibrillator check.  

## 2016-11-03 LAB — CUP PACEART REMOTE DEVICE CHECK
Battery Voltage: 2.97 V
Brady Statistic AP VP Percent: 0 %
Brady Statistic AP VS Percent: 0 %
Brady Statistic AS VP Percent: 45.83 %
Brady Statistic RA Percent Paced: 0 %
Brady Statistic RV Percent Paced: 59.45 %
HighPow Impedance: 69 Ohm
Implantable Lead Implant Date: 20090121
Implantable Lead Implant Date: 20090121
Implantable Lead Location: 753859
Implantable Lead Location: 753860
Implantable Lead Model: 4193
Implantable Lead Model: 6935
Lead Channel Impedance Value: 4047 Ohm
Lead Channel Impedance Value: 4047 Ohm
Lead Channel Impedance Value: 494 Ohm
Lead Channel Pacing Threshold Pulse Width: 0.4 ms
Lead Channel Sensing Intrinsic Amplitude: 10.375 mV
Lead Channel Sensing Intrinsic Amplitude: 10.375 mV
Lead Channel Sensing Intrinsic Amplitude: 3.75 mV
Lead Channel Setting Pacing Amplitude: 2.5 V
Lead Channel Setting Pacing Pulse Width: 0.4 ms
MDC IDC LEAD IMPLANT DT: 20090121
MDC IDC LEAD LOCATION: 753858
MDC IDC MSMT BATTERY REMAINING LONGEVITY: 60 mo
MDC IDC MSMT LEADCHNL LV IMPEDANCE VALUE: 456 Ohm
MDC IDC MSMT LEADCHNL RA SENSING INTR AMPL: 3.75 mV
MDC IDC MSMT LEADCHNL RV IMPEDANCE VALUE: 342 Ohm
MDC IDC MSMT LEADCHNL RV IMPEDANCE VALUE: 437 Ohm
MDC IDC MSMT LEADCHNL RV PACING THRESHOLD AMPLITUDE: 0.625 V
MDC IDC PG IMPLANT DT: 20141124
MDC IDC SESS DTM: 20180815062825
MDC IDC SET LEADCHNL RV SENSING SENSITIVITY: 0.3 mV
MDC IDC STAT BRADY AS VS PERCENT: 54.17 %

## 2016-11-04 ENCOUNTER — Telehealth: Payer: Self-pay

## 2016-11-04 NOTE — Telephone Encounter (Signed)
Call from patient's wife, he is doing well, no further hypotension. Research Visit scheduled for 8-28 @10 :30am.

## 2016-11-07 ENCOUNTER — Ambulatory Visit (HOSPITAL_COMMUNITY)
Admission: RE | Admit: 2016-11-07 | Discharge: 2016-11-07 | Disposition: A | Discharge status: Home / Self Care | Payer: Medicare Other | Source: Ambulatory Visit | Attending: Cardiology | Admitting: Cardiology

## 2016-11-07 ENCOUNTER — Encounter (HOSPITAL_COMMUNITY): Payer: Self-pay | Admitting: Cardiology

## 2016-11-07 VITALS — BP 112/58 | HR 50 | Wt 233.4 lb

## 2016-11-07 DIAGNOSIS — I255 Ischemic cardiomyopathy: Secondary | ICD-10-CM | POA: Diagnosis not present

## 2016-11-07 DIAGNOSIS — I2582 Chronic total occlusion of coronary artery: Secondary | ICD-10-CM | POA: Insufficient documentation

## 2016-11-07 DIAGNOSIS — I482 Chronic atrial fibrillation: Secondary | ICD-10-CM | POA: Diagnosis not present

## 2016-11-07 DIAGNOSIS — Z8679 Personal history of other diseases of the circulatory system: Secondary | ICD-10-CM | POA: Diagnosis not present

## 2016-11-07 DIAGNOSIS — I712 Thoracic aortic aneurysm, without rupture: Secondary | ICD-10-CM | POA: Diagnosis not present

## 2016-11-07 DIAGNOSIS — I5022 Chronic systolic (congestive) heart failure: Secondary | ICD-10-CM | POA: Insufficient documentation

## 2016-11-07 DIAGNOSIS — N183 Chronic kidney disease, stage 3 (moderate): Secondary | ICD-10-CM | POA: Insufficient documentation

## 2016-11-07 DIAGNOSIS — Z87891 Personal history of nicotine dependence: Secondary | ICD-10-CM | POA: Diagnosis not present

## 2016-11-07 DIAGNOSIS — E785 Hyperlipidemia, unspecified: Secondary | ICD-10-CM | POA: Insufficient documentation

## 2016-11-07 DIAGNOSIS — R42 Dizziness and giddiness: Secondary | ICD-10-CM | POA: Diagnosis not present

## 2016-11-07 DIAGNOSIS — E1122 Type 2 diabetes mellitus with diabetic chronic kidney disease: Secondary | ICD-10-CM | POA: Insufficient documentation

## 2016-11-07 DIAGNOSIS — Z7901 Long term (current) use of anticoagulants: Secondary | ICD-10-CM | POA: Insufficient documentation

## 2016-11-07 DIAGNOSIS — M109 Gout, unspecified: Secondary | ICD-10-CM | POA: Diagnosis not present

## 2016-11-07 DIAGNOSIS — Z951 Presence of aortocoronary bypass graft: Secondary | ICD-10-CM | POA: Diagnosis not present

## 2016-11-07 DIAGNOSIS — I4821 Permanent atrial fibrillation: Secondary | ICD-10-CM

## 2016-11-07 DIAGNOSIS — N62 Hypertrophy of breast: Secondary | ICD-10-CM | POA: Diagnosis not present

## 2016-11-07 DIAGNOSIS — Z794 Long term (current) use of insulin: Secondary | ICD-10-CM | POA: Insufficient documentation

## 2016-11-07 DIAGNOSIS — I2510 Atherosclerotic heart disease of native coronary artery without angina pectoris: Secondary | ICD-10-CM | POA: Diagnosis not present

## 2016-11-07 LAB — BASIC METABOLIC PANEL
Anion gap: 11 (ref 5–15)
BUN: 31 mg/dL — AB (ref 6–20)
CO2: 25 mmol/L (ref 22–32)
CREATININE: 1.5 mg/dL — AB (ref 0.61–1.24)
Calcium: 9.2 mg/dL (ref 8.9–10.3)
Chloride: 103 mmol/L (ref 101–111)
GFR calc Af Amer: 51 mL/min — ABNORMAL LOW (ref >60)
GFR, EST NON AFRICAN AMERICAN: 44 mL/min — AB (ref >60)
GLUCOSE: 182 mg/dL — AB (ref 65–99)
POTASSIUM: 4.6 mmol/L (ref 3.5–5.1)
Sodium: 139 mmol/L (ref 135–145)

## 2016-11-07 LAB — DIGOXIN LEVEL: Digoxin Level: 1.4 ng/mL (ref 0.8–2.0)

## 2016-11-07 MED ORDER — EPLERENONE 25 MG PO TABS: 25.0000 mg | ORAL_TABLET | Freq: Every day | ORAL | 3 refills | Status: DC

## 2016-11-07 NOTE — Patient Instructions (Signed)
Stop taking Spirolactone   Start taking epleronone 25 mg (1 tab) daily   Take torsemide 100 mg (5 tab) daily for 2 days, then take 80 mg (4 tabs daily)  Labs done today  Your physician recommends that you schedule a follow-up appointment in: 2 months

## 2016-11-08 ENCOUNTER — Ambulatory Visit (HOSPITAL_BASED_OUTPATIENT_CLINIC_OR_DEPARTMENT_OTHER): Payer: Medicare Other

## 2016-11-08 ENCOUNTER — Other Ambulatory Visit (HOSPITAL_BASED_OUTPATIENT_CLINIC_OR_DEPARTMENT_OTHER): Payer: Medicare Other

## 2016-11-08 ENCOUNTER — Ambulatory Visit (HOSPITAL_BASED_OUTPATIENT_CLINIC_OR_DEPARTMENT_OTHER): Payer: Medicare Other | Admitting: Family

## 2016-11-08 ENCOUNTER — Telehealth (HOSPITAL_COMMUNITY): Payer: Self-pay | Admitting: *Deleted

## 2016-11-08 VITALS — BP 133/61 | HR 51 | Temp 97.7°F | Resp 18 | Wt 234.0 lb

## 2016-11-08 DIAGNOSIS — D45 Polycythemia vera: Secondary | ICD-10-CM

## 2016-11-08 DIAGNOSIS — M545 Low back pain: Secondary | ICD-10-CM | POA: Diagnosis not present

## 2016-11-08 DIAGNOSIS — G629 Polyneuropathy, unspecified: Secondary | ICD-10-CM

## 2016-11-08 DIAGNOSIS — G8929 Other chronic pain: Secondary | ICD-10-CM

## 2016-11-08 DIAGNOSIS — I5022 Chronic systolic (congestive) heart failure: Secondary | ICD-10-CM

## 2016-11-08 LAB — CMP (CANCER CENTER ONLY)
ALBUMIN: 3.6 g/dL (ref 3.3–5.5)
ALK PHOS: 69 U/L (ref 26–84)
ALT: 15 U/L (ref 10–47)
AST: 17 U/L (ref 11–38)
BUN: 27 mg/dL — AB (ref 7–22)
CALCIUM: 9.3 mg/dL (ref 8.0–10.3)
CHLORIDE: 103 meq/L (ref 98–108)
CO2: 29 mEq/L (ref 18–33)
Creat: 1.8 mg/dl — ABNORMAL HIGH (ref 0.6–1.2)
Glucose, Bld: 181 mg/dL — ABNORMAL HIGH (ref 73–118)
POTASSIUM: 4.5 meq/L (ref 3.3–4.7)
Sodium: 143 mEq/L (ref 128–145)
TOTAL PROTEIN: 6.8 g/dL (ref 6.4–8.1)
Total Bilirubin: 1 mg/dl (ref 0.20–1.60)

## 2016-11-08 LAB — CBC WITH DIFFERENTIAL (CANCER CENTER ONLY)
BASO#: 0 10*3/uL (ref 0.0–0.2)
BASO%: 0.4 % (ref 0.0–2.0)
EOS ABS: 0.1 10*3/uL (ref 0.0–0.5)
EOS%: 1 % (ref 0.0–7.0)
HEMATOCRIT: 46.2 % (ref 38.7–49.9)
HEMOGLOBIN: 15.3 g/dL (ref 13.0–17.1)
LYMPH#: 0.9 10*3/uL (ref 0.9–3.3)
LYMPH%: 12.3 % — AB (ref 14.0–48.0)
MCH: 31.5 pg (ref 28.0–33.4)
MCHC: 33.1 g/dL (ref 32.0–35.9)
MCV: 95 fL (ref 82–98)
MONO#: 0.7 10*3/uL (ref 0.1–0.9)
MONO%: 9.7 % (ref 0.0–13.0)
NEUT#: 5.6 10*3/uL (ref 1.5–6.5)
NEUT%: 76.6 % (ref 40.0–80.0)
Platelets: 170 10*3/uL (ref 145–400)
RBC: 4.85 10*6/uL (ref 4.20–5.70)
RDW: 16.2 % — ABNORMAL HIGH (ref 11.1–15.7)
WBC: 7.3 10*3/uL (ref 4.0–10.0)

## 2016-11-08 MED ORDER — DIGOXIN 125 MCG PO TABS: 0.0625 mg | ORAL_TABLET | Freq: Every day | ORAL | 6 refills | Status: DC

## 2016-11-08 NOTE — Progress Notes (Signed)
[name] presents today for phlebotomy per MD orders. Phlebotomy procedure started at 1345 and ended at 1350 via phlebotomy kit to left ac.  540 grams removed Patient observed for 30 minutes after procedure without any incident. Patient tolerated procedure well. IV needle removed intact. Snack and drink taken after phlebotomy. VS intact and pressure dressing clean, dry and intact to left ac at time of discharge.

## 2016-11-08 NOTE — Progress Notes (Signed)
Hematology and Oncology Follow Up Visit  [name] [id number] 1940-11-28 76 y.o. 11/08/2016   Principle Diagnosis:  Polycythemia vera  Current Therapy:   Phlebotomy to maintain hematocrit below 45%. Aspirin 81 mg by mouth weekly Coumadin for chronic atrial fibrillation   Interim History:  [name] is here today with his wife for follow-up. He is doing well and has no complaints at this time.  His Hct is 46.2 so we will phlebotomize him today.   He had his cardiomems pressure sensor placed recently and is doing well in the [place] trial.  No fever, chills, n/v, cough, rash, dizziness, SOB, chest pain, palpitations, abdominal pain or changes in bowel or bladder habits.  He has maintained a good appetite and is staying well hydrated. His weight is stable.  He has chronic lower back pain that is mild and unchanged.  No swelling, tenderness in his extremities. The neuropathy in his feet is unchanged.   ECOG Performance Status: 0 - Asymptomatic  Medications:  Allergies as of 11/08/2016      Reactions   Avelox [moxifloxacin Hcl In Nacl] Swelling, Rash, Other (See Comments)   Patient became hypotensive after infusion started Because of a history of documented adverse serious drug reaction;Medi Alert bracelet  is recommended   Penicillins Anaphylaxis, Swelling, Other (See Comments)   REACTION: anaphylaxis Because of a history of documented adverse serious drug reaction;Medi Alert bracelet  is recommended Has patient had a PCN reaction causing immediate rash, facial/tongue/throat swelling, SOB or lightheadedness with hypotension: Yes Has patient had a PCN reaction causing severe rash involving mucus membranes or skin necrosis: No Has patient had a PCN reaction that required hospitalization: Unknown Has patient had a PCN reaction occurring within the last 10 years: No      Medication List       Accurate as of 11/08/16  3:52 PM. Always use your most recent med list.            ACCU-CHEK AVIVA PLUS test strip Generic drug:  glucose blood USE ONE STRIP TO CHECK GLUCOSE THREE TIMES DAILY AS DIRECTED   ACCU-CHEK FASTCLIX LANCETS Misc USE ONE LANCET TO CHECK GLUCOSE THREE TIMES DAILY   acetaminophen 325 MG tablet Commonly known as:  TYLENOL Take 325 mg by mouth 4 (four) times daily as needed for moderate pain.   allopurinol 100 MG tablet Commonly known as:  ZYLOPRIM TAKE TWO TABLETS BY MOUTH ONCE DAILY   atorvastatin 10 MG tablet Commonly known as:  LIPITOR Take 1 tablet (10 mg total) by mouth daily. Annual appt w/labs is due must see MD for refills   carboxymethylcellulose 0.5 % Soln Commonly known as:  REFRESH PLUS Place 2 drops into both eyes daily.   carvedilol 12.5 MG tablet Commonly known as:  COREG Take 1 tablet (12.5 mg total) by mouth 2 (two) times daily.   Cholecalciferol 1000 units tablet Take 1,000 Units by mouth daily.   colchicine 0.6 MG tablet Take 0.6 mg by mouth daily as needed (gout).   digoxin 0.125 MG tablet Commonly known as:  LANOXIN Take 1 tablet (0.125 mg total) by mouth daily.   eplerenone 25 MG tablet Commonly known as:  INSPRA Take 1 tablet (25 mg total) by mouth daily.   gabapentin 300 MG capsule Commonly known as:  NEURONTIN TAKE ONE CAPSULE BY MOUTH 4 TIMES DAILY AS NEEDED FOR LEG PAIN   Investigational - Study Medication Take 1 tablet by mouth daily. Study name: [place] Additional study details: MK-1242 (  vericiguat) or Placebo   ipratropium 0.06 % nasal spray Commonly known as:  ATROVENT Place 2 sprays into both nostrils daily.   LEVEMIR 100 UNIT/ML injection Generic drug:  insulin detemir Inject 5 Units into the skin 2 (two) times daily.   lidocaine 5 % Commonly known as:  LIDODERM Place 1 patch onto the skin daily as needed for pain.   methocarbamol 750 MG tablet Commonly known as:  ROBAXIN Take 750 mg by mouth 2 (two) times daily as needed for muscle spasms.   nitroGLYCERIN 0.4 MG SL  tablet Commonly known as:  NITROSTAT Place 1 tablet (0.4 mg total) under the tongue every 5 (five) minutes as needed for chest pain (MAX 3 TABLETS).   NOVOLIN R 100 units/mL injection Generic drug:  insulin regular Inject 5 Units into the skin daily before supper.   omeprazole 20 MG capsule Commonly known as:  PRILOSEC Take 20 mg by mouth daily as needed (acid reflux).   potassium chloride SA 20 MEQ tablet Commonly known as:  K-DUR,KLOR-CON Take 1 tablet (20 mEq total) by mouth daily.   sacubitril-valsartan 24-26 MG Commonly known as:  ENTRESTO Take 1 tablet by mouth 2 (two) times daily.   torsemide 20 MG tablet Commonly known as:  DEMADEX Take 4 tablets (80 mg total) by mouth daily.   traMADol 50 MG tablet Commonly known as:  ULTRAM Take 100 mg by mouth every 8 (eight) hours as needed for severe pain.   warfarin 2.5 MG tablet Commonly known as:  COUMADIN Take 1 tablet (2.5 mg total) by mouth See admin instructions.       Allergies:  Allergies  Allergen Reactions  . Avelox [Moxifloxacin Hcl In Nacl] Swelling, Rash and Other (See Comments)    Patient became hypotensive after infusion started Because of a history of documented adverse serious drug reaction;Medi Alert bracelet  is recommended  . Penicillins Anaphylaxis, Swelling and Other (See Comments)    REACTION: anaphylaxis Because of a history of documented adverse serious drug reaction;Medi Alert bracelet  is recommended Has patient had a PCN reaction causing immediate rash, facial/tongue/throat swelling, SOB or lightheadedness with hypotension: Yes Has patient had a PCN reaction causing severe rash involving mucus membranes or skin necrosis: No Has patient had a PCN reaction that required hospitalization: Unknown Has patient had a PCN reaction occurring within the last 10 years: No     Past Medical History, Surgical history, Social history, and Family History were reviewed and updated.  Review of Systems: All  other 10 point review of systems is negative.   Physical Exam:  weight is 234 lb (106.1 kg). His oral temperature is 97.7 F (36.5 C). His blood pressure is 133/61 and his pulse is 51 (abnormal). His respiration is 18 and oxygen saturation is 100%.   Wt Readings from Last 3 Encounters:  11/08/16 234 lb (106.1 kg)  11/07/16 233 lb 6.4 oz (105.9 kg)  10/31/16 227 lb 6.4 oz (103.1 kg)    Ocular: Sclerae unicteric, pupils equal, round and reactive to light Ear-nose-throat: Oropharynx clear, dentition fair Lymphatic: No cervical, supraclavicular or axillary adenopathy Lungs no rales or rhonchi, good excursion bilaterally Heart regular rate and rhythm, no murmur appreciated Abd soft, nontender, positive bowel sounds, no liver or spleen tip palpated on exam, no fluid wave MSK no focal spinal tenderness, no joint edema Neuro: non-focal, well-oriented, appropriate affect Breasts: Deferred   Lab Results  Component Value Date   WBC 7.3 11/08/2016   HGB 15.3 11/08/2016  HCT 46.2 11/08/2016   MCV 95 11/08/2016   PLT 170 11/08/2016   Lab Results  Component Value Date   FERRITIN 56 10/04/2016   IRON 109 10/04/2016   TIBC 336 10/04/2016   UIBC 227 10/04/2016   IRONPCTSAT 33 10/04/2016   Lab Results  Component Value Date   RETICCTPCT 1.4 04/02/2014   RBC 4.85 11/08/2016   RETICCTABS 75.2 04/02/2014   No results found for: KPAFRELGTCHN, LAMBDASER, KAPLAMBRATIO No results found for: IGGSERUM, IGA, IGMSERUM No results found for: Odetta Pink, SPEI   Chemistry      Component Value Date/Time   NA 143 11/08/2016 1249   NA 141 03/09/2016 1320   K 4.5 11/08/2016 1249   K 4.5 03/09/2016 1320   CL 103 11/08/2016 1249   CO2 29 11/08/2016 1249   CO2 25 03/09/2016 1320   BUN 27 (H) 11/08/2016 1249   BUN 28.8 (H) 03/09/2016 1320   CREATININE 1.8 (H) 11/08/2016 1249   CREATININE 1.9 (H) 03/09/2016 1320      Component Value Date/Time    CALCIUM 9.3 11/08/2016 1249   CALCIUM 9.2 03/09/2016 1320   ALKPHOS 69 11/08/2016 1249   ALKPHOS 97 03/09/2016 1320   AST 17 11/08/2016 1249   AST 13 03/09/2016 1320   ALT 15 11/08/2016 1249   ALT 10 03/09/2016 1320   BILITOT 1.00 11/08/2016 1249   BILITOT 0.88 03/09/2016 1320      Impression and Plan: Mr. Mccarthy is a very pleasant 76 yo caucasian gentleman with polycythemia. He continues to do well and has no complaints at this time.  Hct is 46.2 so we will proceed with phlebotomy today as planned.   We will plan to see him back in November per both he and his wife's request as she will be having shoulder surgery in October and she is the main driver.  He will contact our office with any questions or concerns and go to the ED in the event of an emergency. We can certainly see him sooner if need be.   Eliezer Bottom, NP 8/21/20183:52 PM

## 2016-11-08 NOTE — Progress Notes (Signed)
PCP: Dr. Quay Burow Cardiologist: Dr Aundra Dubin   HPI: Mr Mazor is a 76 year old with history of CAD s/p CABG, permanent atrial fib, S/P AAA repair, ischemic cardiomyopathy/chronic systolic heart failure,  Medtronic CRT-D system.   Admitted 4/14 through 07/06/16 with volume overload. Diuresed with IV lasix and transitioned to torsemide 60 mg daily. HF meds adjusted and he was started on Entresto. He was consented to start Cardiomems approval process. Discharge weight was 220 pounds.    He returns for followup today with his wife.  CardioMems was placed recently.  He remains in atrial fibrillation (permanent).  Weight is up about 2 lbs since last seen. No chest pain.  No exertional dyspnea, able to do all ADLs without problems.  No orthopnea/PND.  A week or so ago, he was having lightheadedness with standing and low BP.  I decreased Entresto and lightheadedness has resolved.  No falls.  SBP currently 120s-130s when he checks at home. Finally, he notes breast pain bilaterally.    Optivol: fluid index < threshold but approaching, impedance trending down.  PADP 19 mmHg by CardioMems, some up-trend recently.    Labs (5/18): K 4.7, creatinine 1.7, hgb 15.8, BNP 1001, LDL 71 Labs (8/18): K 4.3, creatinine 1.56, hgb 14.3  PMH: 1. CAD: s/p CABG.  - LHC (12/17): totally occluded distal RCA, totally occluded mid LAD, totally occluded OM2 and OM3.  LIMA-LAD patent with 60% ostial stenosis.  SVG-OM3 and SVG-PDA were patent. Medical management.  2. Chronic systolic CHF: Ischemic cardiomyopathy.  He has a Medtronic CRT-D device.  - Echo (12/17) with EF 35-40%, diffuse hypokinesis, normal RV size and systolic function, mild MR.  - CardioMems placed.  - Painful gynecomastia with spironolactone.  3. AAA: s/p repair.  4. Atrial fibrillation: Permanent.  5. Gout 6. PAD: Saw Dr Scot Dock with [facility] in 4/18.  Noninvasive study concerning for infra-inguinal arterial occlusive disease bilaterally but appeared to have  adequate circulation to heal toe ulcer.  7. CKD stage 3.  8. Ascending aortic aneurysm: CT chest 3/18 ascending aorta 4.9 cm, arch 4.4 cm.  9. Spinal stenosis 10. Polycythemia vera 11. Hyperlipidemia 12. Type II diabetes    ROS: All systems negative except as listed in HPI, PMH and Problem List.  Social History   Social History  . Marital status: Married    Spouse name: N/A  . Number of children: N/A  . Years of education: N/A   Occupational History  . Retired- Nurse, mental health    Social History Main Topics  . Smoking status: Former Smoker    Packs/day: 3.00    Years: 39.00    Types: Cigarettes    Start date: 05/24/1956    Quit date: 03/22/1995  . Smokeless tobacco: Never Used  . Alcohol use 1.2 oz/week    2 Cans of beer per week  . Drug use: No  . Sexual activity: No   Other Topics Concern  . Not on file   Social History Narrative  . No narrative on file    Family History  Problem Relation Age of Onset  . Hypertension Mother   . Stroke Mother   . Diabetes Father   . Coronary artery disease Father   . Other Father        DVT  . Diabetes Brother   . Colon cancer Neg Hx   . Heart attack Neg Hx      Current Outpatient Prescriptions  Medication Sig Dispense Refill  . ACCU-CHEK AVIVA PLUS test strip  USE ONE STRIP TO CHECK GLUCOSE THREE TIMES DAILY AS DIRECTED 100 each 3  . ACCU-CHEK FASTCLIX LANCETS MISC USE ONE LANCET TO CHECK GLUCOSE THREE TIMES DAILY 102 each 5  . acetaminophen (TYLENOL) 325 MG tablet Take 325 mg by mouth 4 (four) times daily as needed for moderate pain.     . Albuterol Sulfate (PROAIR RESPICLICK) 220 (90 Base) MCG/ACT AEPB Inhale 2 puffs into the lungs every 6 (six) hours as needed. For wheezing or shortness of breath. 1 each 0  . allopurinol (ZYLOPRIM) 100 MG tablet TAKE TWO TABLETS BY MOUTH ONCE DAILY (Patient taking differently: TAKE TWO TABLETS BY MOUTH ONCE DAILY IN THE EVENING) 180 tablet 3  . atorvastatin (LIPITOR) 10 MG tablet  Take 1 tablet (10 mg total) by mouth daily. Annual appt w/labs is due must see MD for refills 30 tablet 0  . carboxymethylcellulose (REFRESH PLUS) 0.5 % SOLN Place 2 drops into both eyes daily.     . carvedilol (COREG) 12.5 MG tablet Take 1 tablet (12.5 mg total) by mouth 2 (two) times daily. 60 tablet 6  . Cholecalciferol 1000 units tablet Take 1,000 Units by mouth daily.    . colchicine 0.6 MG tablet Take 0.6 mg by mouth daily as needed (gout).     Marland Kitchen digoxin (LANOXIN) 0.125 MG tablet Take 1 tablet (0.125 mg total) by mouth daily. 30 tablet 6  . gabapentin (NEURONTIN) 300 MG capsule TAKE ONE CAPSULE BY MOUTH 4 TIMES DAILY AS NEEDED FOR LEG PAIN (Patient taking differently: Take 600 mg by mouth in the moring, 600 mg at supper, and 300 mg at bedtime) 360 capsule 3  . insulin detemir (LEVEMIR) 100 UNIT/ML injection Inject 5 Units into the skin 2 (two) times daily.     . insulin regular (NOVOLIN R) 100 units/mL injection Inject 5 Units into the skin daily before supper.     . Investigational - Study Medication Take 1 tablet by mouth daily. Study name: [place] Additional study details: MK-1242 (vericiguat) or Placebo 1 each PRN  . ipratropium (ATROVENT) 0.06 % nasal spray Place 2 sprays into both nostrils daily.     Marland Kitchen lidocaine (LIDODERM) 5 % Place 1 patch onto the skin daily as needed for pain.    . methocarbamol (ROBAXIN) 750 MG tablet Take 750 mg by mouth 2 (two) times daily as needed for muscle spasms.    Marland Kitchen omeprazole (PRILOSEC) 20 MG capsule Take 20 mg by mouth daily as needed (acid reflux).     . potassium chloride SA (K-DUR,KLOR-CON) 20 MEQ tablet Take 1 tablet (20 mEq total) by mouth daily. 90 tablet 3  . sacubitril-valsartan (ENTRESTO) 24-26 MG Take 1 tablet by mouth 2 (two) times daily. 60 tablet 3  . torsemide (DEMADEX) 20 MG tablet Take 4 tablets (80 mg total) by mouth daily. 120 tablet 6  . traMADol (ULTRAM) 50 MG tablet Take 100 mg by mouth every 8 (eight) hours as needed for severe  pain.     Marland Kitchen warfarin (COUMADIN) 2.5 MG tablet Take 1 tablet (2.5 mg total) by mouth See admin instructions. (Patient taking differently: Take 2.5 mg by mouth. Take 2.5 mg daily except Wednesday take 5mg  daily (take in the pm)) 120 tablet 1  . eplerenone (INSPRA) 25 MG tablet Take 1 tablet (25 mg total) by mouth daily. 30 tablet 3  . nitroGLYCERIN (NITROSTAT) 0.4 MG SL tablet Place 1 tablet (0.4 mg total) under the tongue every 5 (five) minutes as needed for chest pain (MAX  3 TABLETS). (Patient not taking: Reported on 11/07/2016) 25 tablet 3   No current facility-administered medications for this encounter.     Vitals:   11/07/16 1346  BP: (!) 112/58  Pulse: (!) 50  SpO2: 93%  Weight: 233 lb 6.4 oz (105.9 kg)    PHYSICAL EXAM: General: NAD Neck: No JVD, no thyromegaly or thyroid nodule.  Lungs: Clear to auscultation bilaterally with normal respiratory effort. CV: Nondisplaced PMI.  Heart regular S1/S2, no S3/S4, no murmur.  1+ edema 1/3 to knees bilaterally.  No carotid bruit.  Normal pedal pulses.  Abdomen: Soft, nontender, no hepatosplenomegaly, no distention.  Skin: Intact without lesions or rashes.  Neurologic: Alert and oriented x 3.  Psych: Normal affect. Extremities: No clubbing or cyanosis.  HEENT: Normal.  Breasts: Mild gynecomastia, mildly painful.   ASSESSMENT & PLAN: 1. Chronic Systolic Heart Failure: Ischemic cardiomyopathy. Echo 12/17 with EF 35-40%. Medtronic CRT-D.  NYHA class II.  Optivol and CardioMems both suggest rising volume today, weight is up 2 lbs.  - Goal PADP for CardioMems will be < 20.  - Increase torsemide to 100 mg daily x 2 days then back to 80 mg daily. BMET today.  - Continue digoxin, check level today.  - Continue current Entresto, Coreg, and spironolactone.  - d/c spironolactone (painful gynecomastia) and start eplerenone 25 mg daily.   2. Atrial fibrillation: Permanent.  Rate-controlled.  Continue warfarin. Recent CBC ok.  3. CAD: s/p CABG.  No  chest pain.  - No ASA given warfarin use.  - Continue statin.  Good lipids 5/18.   4. CKD Stage III: BMET today.  5. Gout: stable. Continue allopurinol.  6. Ascending aortic aneurysm: 3/18 CTA 4.9 cm ascending aorta.  Repeat CTA in 3/19, he will follow with Dr. Cyndia Bent for this.   Followup in 2 months.   Loralie Champagne  11/08/2016

## 2016-11-08 NOTE — Patient Instructions (Signed)

## 2016-11-08 NOTE — Telephone Encounter (Signed)
-----   Message from [name], MD sent at 11/07/2016 10:23 PM EDT ----- Decrease digoxin to 0.0625 daily. Digoxin level again in 10 days as trough.

## 2016-11-09 ENCOUNTER — Telehealth: Payer: Self-pay | Admitting: Endocrinology

## 2016-11-09 ENCOUNTER — Other Ambulatory Visit: Payer: Self-pay

## 2016-11-09 LAB — IRON AND TIBC
%SAT: 26 % (ref 20–55)
Iron: 101 ug/dL (ref 42–163)
TIBC: 391 ug/dL (ref 202–409)
UIBC: 290 ug/dL (ref 117–376)

## 2016-11-09 LAB — FERRITIN: Ferritin: 56 ng/ml (ref 22–316)

## 2016-11-09 MED ORDER — GABAPENTIN 300 MG PO CAPS: ORAL_CAPSULE | ORAL | 3 refills | Status: DC

## 2016-11-09 NOTE — Telephone Encounter (Signed)
MEDICATION: gabapentin (NEURONTIN) 300 MG capsule  PHARMACY:  [facility] ([facility] [place] [address] [phone] (Phone) [phone] (Fax)   IS THIS A 90 DAY SUPPLY : yes  IS PATIENT OUT OF MEDICTAION: yes  IF NOT; HOW MUCH IS LEFT: n/a  LAST APPOINTMENT DATE:09/07/16  NEXT APPOINTMENT DATE:12/08/16  OTHER COMMENTS: Patient needs dosage for 5x a day; I advised the patient's wife that there was no guarantee however, the nurse will call if any questions, otherwise the pharmacy will call once rx is ready.   **Let patient know to contact pharmacy at the end of the day to make sure medication is ready. **  ** Please notify patient to allow 48-72 hours to process**  **Encourage patient to contact the pharmacy for refills or they can request refills through [facility]**

## 2016-11-09 NOTE — Telephone Encounter (Signed)
Called patient and left a voice message to let him know that I have adjusted his Gabapentin to 5 times per day per Dr. Ronnie Derby last [place] note. I have sent this in to the pharmacy for him.

## 2016-11-10 DIAGNOSIS — H903 Sensorineural hearing loss, bilateral: Secondary | ICD-10-CM | POA: Diagnosis not present

## 2016-11-10 DIAGNOSIS — H60332 Swimmer's ear, left ear: Secondary | ICD-10-CM | POA: Diagnosis not present

## 2016-11-11 ENCOUNTER — Other Ambulatory Visit: Payer: Self-pay | Admitting: Internal Medicine

## 2016-11-11 NOTE — Telephone Encounter (Signed)
[place] controlled substance database checked.  Ok to fill medication. rx printed 

## 2016-11-12 ENCOUNTER — Encounter: Payer: Self-pay | Admitting: Internal Medicine

## 2016-11-14 NOTE — Telephone Encounter (Signed)
RX faxed to [facility] 

## 2016-11-15 ENCOUNTER — Encounter: Payer: Self-pay | Admitting: *Deleted

## 2016-11-15 ENCOUNTER — Encounter: Payer: Self-pay | Admitting: Cardiology

## 2016-11-15 ENCOUNTER — Ambulatory Visit (HOSPITAL_COMMUNITY)
Admission: RE | Admit: 2016-11-15 | Discharge: 2016-11-15 | Disposition: A | Discharge status: Home / Self Care | Payer: Medicare Other | Source: Ambulatory Visit | Attending: Internal Medicine | Admitting: Internal Medicine

## 2016-11-15 DIAGNOSIS — I5022 Chronic systolic (congestive) heart failure: Secondary | ICD-10-CM | POA: Diagnosis not present

## 2016-11-15 DIAGNOSIS — Z006 Encounter for examination for normal comparison and control in clinical research program: Secondary | ICD-10-CM

## 2016-11-15 LAB — DIGOXIN LEVEL: DIGOXIN LVL: 0.8 ng/mL (ref 0.8–2.0)

## 2016-11-15 NOTE — Telephone Encounter (Signed)
rx printed 11/11/16

## 2016-11-15 NOTE — Progress Notes (Signed)
RESEARCH ENCOUNTER  Patient ID: [name]  DOB: [birth year]  [name] presented to the [facility] for an Unscheduled Visit of the [employer].  No signs/symptoms of ACS, hypotension, nor syncope since the last visit. Subject states compliance with IP, IP returned, and additional IP dispensed.  IP increased to 5mg /placebo.     Patient will follow [facility] in 2 weeks.

## 2016-11-17 ENCOUNTER — Telehealth (HOSPITAL_COMMUNITY): Payer: Self-pay | Admitting: *Deleted

## 2016-11-17 ENCOUNTER — Other Ambulatory Visit: Payer: Self-pay | Admitting: Internal Medicine

## 2016-11-17 NOTE — Telephone Encounter (Signed)
Pt's cardiomems was elevated to 20 today, he should be 17, per Darrick Grinder, NP pt needs to increase Torsemide to 80 mg (4 tabs) Twice daily.  Have attempted to call pt twice today and Left message to call back

## 2016-11-18 MED ORDER — TORSEMIDE 20 MG PO TABS: 80.0000 mg | ORAL_TABLET | Freq: Two times a day (BID) | ORAL | 6 refills | Status: DC

## 2016-11-18 NOTE — Telephone Encounter (Signed)
[name] returned call as she received message about cardioMems, voiced understanding aware of changes. Patient is out of town and reports they have enough pills to supply increase. Request to not send new RX at this time/

## 2016-11-22 ENCOUNTER — Telehealth (HOSPITAL_COMMUNITY): Payer: Self-pay | Admitting: *Deleted

## 2016-11-22 MED ORDER — TORSEMIDE 20 MG PO TABS: 80.0000 mg | ORAL_TABLET | Freq: Every day | ORAL | 6 refills | Status: DC

## 2016-11-22 NOTE — Telephone Encounter (Signed)
cardiomems down to 14, per Dr Aundra Dubin advised to decrease Torsemide to 80 mg daily, wife aware and agreeable

## 2016-11-25 ENCOUNTER — Ambulatory Visit (HOSPITAL_COMMUNITY)
Admission: RE | Admit: 2016-11-25 | Discharge: 2016-11-25 | Disposition: A | Discharge status: Home / Self Care | Payer: Medicare Other | Source: Ambulatory Visit | Attending: Internal Medicine | Admitting: Internal Medicine

## 2016-11-25 DIAGNOSIS — I5022 Chronic systolic (congestive) heart failure: Secondary | ICD-10-CM

## 2016-11-25 NOTE — Progress Notes (Signed)
   Cardiomems Update   Implant Date 10/19/2016  RHC PA 31/14, mean 20 PCWP 11 CO/CI 4.92/2.21    Implant Labs 10/19/2016 Creatinine 1.56 BUN 41 Potassium 4.3   PAD Range 15-20 Current PAD 18  meds - torsemide 80 mg daily   Current Labs  11/08/2016  Creatinine 1.8 BUN 27 Potassium 4.5    Recommendations  I have reviewed current PAD from cardiomems device. PAD reading stable. .  Continue current diuretic regimen.   Follow monthly   Constance Hackenberg  NP-C

## 2016-11-28 ENCOUNTER — Telehealth (HOSPITAL_COMMUNITY): Payer: Self-pay | Admitting: *Deleted

## 2016-11-28 DIAGNOSIS — I5032 Chronic diastolic (congestive) heart failure: Secondary | ICD-10-CM

## 2016-11-28 MED ORDER — TORSEMIDE 20 MG PO TABS: ORAL_TABLET | ORAL | 6 refills | Status: DC

## 2016-11-28 NOTE — Telephone Encounter (Signed)
Pt's cardiomems was elevated today, per Darrick Grinder, NP have pt take Torsemide 80 mg BID for 2 days then increase dose to 80 mg in AM and 40 mg in PM, bmet later this week.   Pt's wife aware and agreeable.  Bmet sch for [employer].

## 2016-11-29 ENCOUNTER — Encounter: Payer: Self-pay | Admitting: *Deleted

## 2016-11-29 ENCOUNTER — Other Ambulatory Visit: Payer: Self-pay | Admitting: Endocrinology

## 2016-11-29 ENCOUNTER — Encounter: Payer: Self-pay | Admitting: Cardiology

## 2016-11-29 DIAGNOSIS — Z006 Encounter for examination for normal comparison and control in clinical research program: Secondary | ICD-10-CM

## 2016-11-29 NOTE — Progress Notes (Signed)
Pre visit review using our clinic review tool, if applicable. No additional management support is needed unless otherwise documented below in the visit note. 

## 2016-11-29 NOTE — Progress Notes (Signed)
Subjective:   [name] is a 76 y.o. male who presents for Medicare Annual/Subsequent preventive examination.  Review of Systems:  No ROS.  Medicare Wellness Visit. Additional risk factors are reflected in the social history.  Cardiac Risk Factors include: advanced age (>68men, >10 women);diabetes mellitus;dyslipidemia;hypertension;male gender;sedentary lifestyle Sleep patterns: feels rested on waking, gets up 1 times nightly to void and sleeps 7-8 hours nightly.    Home Safety/Smoke Alarms: Feels safe in home. Smoke alarms in place.  Living environment; residence and Firearm Safety: 1-story house/ trailer, equipment: Radio producer, Type: Single Electrical engineer, Type: Tub Surveyor, quantity, no firearms. Lives with wife, no needs for DME, good support system Seat Belt Safety/Bike Helmet: Wears seat belt.     Objective:    Vitals: BP (!) 84/50   Pulse (!) 56   Temp 97.9 F (36.6 C) (Oral)   Resp 16   Ht 6' (1.829 m)   Wt 236 lb (107 kg)   SpO2 98%   BMI 32.01 kg/m   Body mass index is 32.01 kg/m.  Tobacco History  Smoking Status  . Former Smoker  . Packs/day: 3.00  . Years: 39.00  . Types: Cigarettes  . Start date: 05/24/1956  . Quit date: 03/22/1995  Smokeless Tobacco  . Never Used     Counseling given: Not Answered   Past Medical History:  Diagnosis Date  . AAA (abdominal aortic aneurysm) ([place])    Surgical repair 11/2002.  . Adenomatous colon polyp   . Alcohol ingestion of more than four drinks per week    Excess beer  not a dependency problem  . Aortic valve sclerosis   . Arthritis   . Atrial fibrillation ([place])    Previous long-term amiodarone therapy with multiple cardioversions / amiodarone stopped September, 2009  . Atrial flutter [facility])    Started November, 2010, Left-sided and cannot ablate  . Bony abnormality    Patient's manubrium is slightly displaced to the right  . CAD (coronary artery disease)    a. s/p CABG 2004;  b. 02/2016 Cath: LM nl, LAD  70p, 165m, D1 50, D2 50, RI 50ost, LCX 57m, OM2 100, OM3 100, RCA 70p, 100d, VG->OM3 ok, LIMA->LAD 60ost, VG->RPDA  ok.  . 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 01/2013 MDT DTBA 1D1 [place] CRT-D, ser # [id number].  . Carotid artery disease ([place])    Doppler, December, 2013, 0-39% bilateral  . Chronic systolic CHF (congestive heart failure) ([place])    a. 02/2016 Echo: EF 35-40%, diff HK, triv AI, mildly dil Ao root 73mm, mild MR, sev dil LA, triv TR.  Marland Kitchen Chronotropic incompetence    IV pacing rate adjusted  . CKD (chronic kidney disease), stage III   . COPD (chronic obstructive pulmonary disease) ([place])   . Dilated aortic root ([place])   . Discolored skin   . Diverticulosis   . Drug therapy    Redness and swelling with Avelox infusion May 24, 2011  . Eye abnormality    Ophthalmologist questions a clot in one of his eyes, May, 2012  . Gout   . Hyperlipidemia   . Hypertension   . Internal hemorrhoids   . Ischemic cardiomyopathy   . Left atrial thrombus    Remote past... cardioversions done since that time  . Mitral regurgitation    Mild echo  . Myocardial infarction ([place])   . Nasal drainage    Chronic  . Overweight(278.02)   . Pericardial  effusion   . Pleural effusion    Large loculated effusion on the left side November, 2011. This was tapped. It was exudative. Cytology revealed no cancer no proof of mesothelioma area pulmonary team felt that no further workup was needed  . Pleural thickening   . Pneumonia   . Polycythemia vera ([place]) 07/28/2014  . SOB (shortness of breath)    Large left effusion/ thoracentesis/hospitalization/November, 2011... exudated.. cytology negative.. Dr.Wert.. no proof of mesothelioma  . Spinal stenosis    Surgery Dr.Elsner  . Thrombophlebitis of superficial veins of upper extremities    Possible venous stenosis from defibrillator  . Type II diabetes mellitus ([place]) 2008  . Venous insufficiency    Toe discoloration chronic  .  Ventral hernia    April, 2014, result of his abdominal surgery  . Warfarin anticoagulation   . Wide-complex tachycardia ([place])    Past Surgical History:  Procedure Laterality Date  . ABDOMINAL AORTIC ANEURYSM REPAIR  11/2002  . ANTERIOR CERVICAL DECOMP/DISCECTOMY FUSION  1995  . BACK SURGERY    . BI-VENTRICULAR PACEMAKER INSERTION (CRT-P)  02-11-2013   Pt with previously implanted MDT CRTD downgraded to CRTP by Dr Lovena Le 02-11-13  . CARDIAC CATHETERIZATION  "several"  . CARDIAC CATHETERIZATION N/A 03/18/2016   Procedure: RIGHT/LEFT HEART CATH AND CORONARY/GRAFT ANGIOGRAPHY;  Surgeon: Nelva Bush, MD;  Location: [facility];  Service: Cardiovascular;  Laterality: N/A;  . CATARACT EXTRACTION W/ INTRAOCULAR LENS  IMPLANT, BILATERAL    . COLONOSCOPY W/ POLYPECTOMY    . CORONARY ANGIOPLASTY WITH STENT PLACEMENT  2004  . CORONARY ARTERY BYPASS GRAFT  2004   CABG X4  . FRACTURE SURGERY    . IMPLANTABLE CARDIOVERTER DEFIBRILLATOR (ICD) GENERATOR CHANGE N/A 02/11/2013   Procedure: ICD GENERATOR CHANGE;  Surgeon: Evans Lance, MD;  Location: [facility] CATH LAB;  Service: Cardiovascular;  Laterality: N/A;  . INCISION AND DRAINAGE ABSCESS / HEMATOMA OF BURSA / KNEE / THIGH    . INSERT / REPLACE / REMOVE PACEMAKER  2009   original pacer/defibrillator; Dr. Lovena Le 2009... by the pacing  . LUMBAR FUSION  2010  . ORIF TIBIA & FIBULA FRACTURES Right 2000s  . PILONIDAL CYST EXCISION    . RIGHT HEART CATH N/A 10/19/2016   Procedure: Right Heart Cath;  Surgeon: [name], MD;  Location: [facility];  Service: Cardiovascular;  Laterality: N/A;  . SPINAL CORD STIMULATOR IMPLANT  12/2011  . SURGERY SCROTAL / TESTICULAR     Family History  Problem Relation Age of Onset  . Hypertension Mother   . Stroke Mother   . Diabetes Father   . Coronary artery disease Father   . Other Father        DVT  . Diabetes Brother   . Colon cancer Neg Hx   . Heart attack Neg Hx    History  Sexual  Activity  . Sexual activity: No    Outpatient Encounter Prescriptions as of 11/30/2016  Medication Sig  . ACCU-CHEK AVIVA PLUS test strip USE 1 STRIP TO CHECK GLUCOSE THREE TIMES DAILY AS DIRECTED  . ACCU-CHEK FASTCLIX LANCETS MISC USE ONE LANCET TO CHECK GLUCOSE THREE TIMES DAILY  . acetaminophen (TYLENOL) 325 MG tablet Take 325 mg by mouth 4 (four) times daily as needed for moderate pain.   Marland Kitchen allopurinol (ZYLOPRIM) 100 MG tablet TAKE TWO TABLETS BY MOUTH ONCE DAILY (Patient taking differently: TAKE TWO TABLETS BY MOUTH ONCE DAILY IN THE EVENING)  . atorvastatin (LIPITOR) 10 MG  tablet Take 1 tablet (10 mg total) by mouth daily.  . carboxymethylcellulose (REFRESH PLUS) 0.5 % SOLN Place 2 drops into both eyes daily.   . carvedilol (COREG) 12.5 MG tablet Take 1 tablet (12.5 mg total) by mouth 2 (two) times daily.  . Cholecalciferol 1000 units tablet Take 1,000 Units by mouth daily.  . colchicine 0.6 MG tablet Take 0.6 mg by mouth daily as needed (gout).   Marland Kitchen digoxin (LANOXIN) 0.125 MG tablet Take 0.5 tablets (0.0625 mg total) by mouth daily.  Marland Kitchen eplerenone (INSPRA) 25 MG tablet Take 1 tablet (25 mg total) by mouth daily.  Marland Kitchen gabapentin (NEURONTIN) 300 MG capsule Take 2 capsules (600 mg total) by mouth 3 (three) times daily. TAKE ONE CAPSULE BY MOUTH 5 TIMES DAILY AS NEEDED FOR LEG PAIN  . insulin detemir (LEVEMIR) 100 UNIT/ML injection Inject 5 Units into the skin 2 (two) times daily.   . insulin regular (NOVOLIN R) 100 units/mL injection Inject 5 Units into the skin daily before supper.   . Investigational - Study Medication Take 1 tablet by mouth daily. Study name: [place] Additional study details: MK-1242 (vericiguat) or Placebo  . ipratropium (ATROVENT) 0.06 % nasal spray Place 2 sprays into both nostrils daily.   Marland Kitchen lidocaine (LIDODERM) 5 % Place 1 patch onto the skin daily as needed for pain.  . methocarbamol (ROBAXIN) 750 MG tablet Take 750 mg by mouth 2 (two) times daily as needed for  muscle spasms.  . nitroGLYCERIN (NITROSTAT) 0.4 MG SL tablet Place 1 tablet (0.4 mg total) under the tongue every 5 (five) minutes as needed for chest pain (MAX 3 TABLETS).  Marland Kitchen omeprazole (PRILOSEC) 20 MG capsule Take 20 mg by mouth daily as needed (acid reflux).   . potassium chloride SA (K-DUR,KLOR-CON) 20 MEQ tablet Take 1 tablet (20 mEq total) by mouth daily.  . sacubitril-valsartan (ENTRESTO) 24-26 MG Take 1 tablet by mouth 2 (two) times daily.  Marland Kitchen torsemide (DEMADEX) 20 MG tablet Take 4 tabs in AM and 2 tabs in PM  . traMADol (ULTRAM) 50 MG tablet TAKE TWO TABLETS BY MOUTH EVERY 8 HOURS AS NEEDED  . warfarin (COUMADIN) 2.5 MG tablet Take 1 tablet (2.5 mg total) by mouth See admin instructions. (Patient taking differently: Take 2.5 mg by mouth. Take 2.5 mg daily except Wednesday take 5mg  daily (take in the pm))  . [DISCONTINUED] atorvastatin (LIPITOR) 10 MG tablet Take 1 tablet (10 mg total) by mouth daily. Annual appt w/labs is due must see MD for refills  . [DISCONTINUED] gabapentin (NEURONTIN) 300 MG capsule TAKE ONE CAPSULE BY MOUTH 5 TIMES DAILY AS NEEDED FOR LEG PAIN   No facility-administered encounter medications on file as of 11/30/2016.     Activities of Daily Living In your present state of health, do you have any difficulty performing the following activities: 11/30/2016 10/19/2016  Hearing? Y [place]? N N  Difficulty concentrating or making decisions? N N  Walking or climbing stairs? N Y  Dressing or bathing? Y N  Doing errands, shopping? Y -  Conservation officer, nature and eating ? Y -  Using the Toilet? N -  In the past six months, have you accidently leaked urine? N -  Do you have problems with loss of bowel control? N -  Managing your Medications? N -  Managing your Finances? N -  Housekeeping or managing your Housekeeping? Y -  Some recent data might be hidden    Patient Care Team:  [name], MD as PCP - General (Internal Medicine) Angelia Mould,  MD as Attending Physician (Vascular Surgery) Carlena Bjornstad, MD (Cardiology) Evans Lance, MD (Cardiology) Clydell Hakim, MD (Pain Medicine) Inocencio Homes, DPM as Consulting Physician (Podiatry) Jettie Booze, MD as Consulting Physician (Cardiology) Pricilla Loveless, MD as Referring Physician (Cardiology) [place], Holli Humbles, NP as Nurse Practitioner (Nurse Practitioner)   Assessment:    Physical assessment deferred to PCP.  Exercise Activities and Dietary recommendations Current Exercise Habits: Home exercise routine, Type of exercise: stretching (chair exercises), Time (Minutes): 20, Frequency (Times/Week): 5, Weekly Exercise (Minutes/Week): 100, Intensity: Mild, Exercise limited by: orthopedic condition(s)  Diet (meal preparation, eat out, water intake, caffeinated beverages, dairy products, fruits and vegetables): in general, a "healthy" diet  , well balanced, eats a variety of fruits and vegetables daily, limits salt, fat/cholesterol, sugar, caffeine, drinks 1-2 glasses of water daily.  Encouraged patient to increase daily water intake.    Goals    . Be as active and as independent as possible          Continue to do chair exercises, stretches, and walk as tolerated.       Fall Risk Fall Risk  11/30/2016 05/27/2016 03/09/2016 12/30/2015 11/04/2015  Falls in the past year? No No No No No   Depression Screen PHQ 2/9 Scores 11/30/2016 05/27/2016 06/02/2014  PHQ - 2 Score 0 0 0  PHQ- 9 Score 1 - -    Cognitive Function       Ad8 score reviewed for issues:  Issues making decisions: no  Less interest in hobbies / activities: no  Repeats questions, stories (family complaining): no  Trouble using ordinary gadgets (microwave, computer, phone):no  Forgets the month or year: no  Mismanaging finances: no  Remembering appts: no  Daily problems with thinking and/or memory: no Ad8 score is= 0   Immunization History  Administered Date(s) Administered  .  Influenza Split 01/06/2011, 01/12/2012  . Influenza Whole 01/08/2009, 12/10/2009  . Influenza, High Dose Seasonal PF 12/20/2012, 12/12/2014, 01/07/2016, 11/30/2016  . Influenza,inj,Quad PF,6+ Mos 12/03/2013  . Pneumococcal Conjugate-13 06/02/2014  . Pneumococcal Polysaccharide-23 01/01/2010, 05/25/2011  . Tdap 10/07/2014  . Zoster 01/19/2013   Screening Tests Health Maintenance  Topic Date Due  . HEMOGLOBIN A1C  01/01/2017  . OPHTHALMOLOGY EXAM  03/18/2017  . URINE MICROALBUMIN  09/02/2017  . FOOT EXAM  09/07/2017  . TETANUS/TDAP  10/06/2024  . INFLUENZA VACCINE  Completed  . PNA vac Low Risk Adult  Completed      Plan:    Continue doing brain stimulating activities (puzzles, reading, adult coloring books, staying active) to keep memory sharp.   Continue to eat heart healthy diet (full of fruits, vegetables, whole grains, lean protein, water--limit salt, fat, and sugar intake) and increase physical activity as tolerated.  I have personally reviewed and noted the following in the patient's chart:   . Medical and social history . Use of alcohol, tobacco or illicit drugs  . Current medications and supplements . Functional ability and status . Nutritional status . Physical activity . Advanced directives . List of other physicians . Vitals . Screenings to include cognitive, depression, and falls . Referrals and appointments  In addition, I have reviewed and discussed with patient certain preventive protocols, quality metrics, and best practice recommendations. A written personalized care plan for preventive services as well as general preventive health recommendations were provided to patient.     Michiel Cowboy, RN  11/30/2016  Medical screening examination/treatment/procedure(s) were performed by non-physician practitioner and as supervising physician I was immediately available for consultation/collaboration. I agree with above. [name], MD

## 2016-11-29 NOTE — Progress Notes (Signed)
Subjective:    Patient ID: [name], male    DOB: 1940-06-24, 76 y.o.   MRN: [id number]  HPI He is here for a physical exam.   (04/13/16)  He has chronic back pain and is taking tramadol some days, but not every day.  The medication works well.  He has no concerns.    Medications and allergies reviewed with patient and updated if appropriate.  Patient Active Problem List   Diagnosis Date Noted  . Abnormal CXR 05/29/2016  . Effusion of left elbow 04/13/2016  . Angina pectoris ([place]) 03/15/2016  . History of gout 10/09/2015  . Polycythemia vera ([place]) 07/28/2014  . Ascending aortic aneurysm ([place]) 05/10/2014  . Hypoxia 05/05/2014  . COPD exacerbation ([place]) 05/05/2014  . Diabetes ([place]) 05/05/2014  . Chronic systolic CHF (congestive heart failure) ([place]) 03/07/2014  . History of DVT (deep vein thrombosis) 03/07/2014  . Cardiomyopathy, ischemic 02/05/2014  . Pleural plaque without asbestosis 01/23/2014  . Elevated hemoglobin ([place]) 01/14/2014  . CKD (chronic kidney disease) stage 3, GFR 30-59 ml/min 01/14/2014  . Asbestos exposure 01/14/2014  . Shortness of breath 01/01/2014  . Ejection fraction   . Hypopotassemia 12/20/2012  . ACE-inhibitor cough 10/04/2012  . Ventral hernia   . Bony abnormality   . Carotid artery disease ([place])   . Spinal cord stimulator status   . Peripheral vascular disease with claudication ([place]) 08/31/2011  . Atrial fibrillation with rapid ventricular response ([place])   . Spinal stenosis   . CAD (coronary artery disease)   . Venous insufficiency   . Mitral regurgitation   . Aortic valve sclerosis   . Alcohol ingestion of more than four drinks per week   . Chronotropic incompetence   . Thrombophlebitis of superficial veins of upper extremities   . Pericardial effusion   . Overweight(278.02)   . Pleural effusion   . Hyperlipidemia   . Hypertension   . Atrial flutter ([place])   . Left atrial thrombus   . AAA (abdominal aortic aneurysm) ([place])     . S/P ICD (internal cardiac defibrillator) procedure   . SOB (shortness of breath)   . Warfarin anticoagulation   . Diabetic polyneuropathy ([place]) 01/07/2010  . DIVERTICULOSIS, COLON 06/10/2009  . BACK PAIN, LUMBAR 06/12/2008  . 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 Acute gout 07/27/2006  . CORONARY ARTERY BYPASS GRAFT, HX OF 07/27/2006    Current Outpatient Prescriptions on File Prior to Visit  Medication Sig Dispense Refill  . ACCU-CHEK AVIVA PLUS test strip USE 1 STRIP TO CHECK GLUCOSE THREE TIMES DAILY AS DIRECTED 100 each 3  . ACCU-CHEK FASTCLIX LANCETS MISC USE ONE LANCET TO CHECK GLUCOSE THREE TIMES DAILY 102 each 5  . acetaminophen (TYLENOL) 325 MG tablet Take 325 mg by mouth 4 (four) times daily as needed for moderate pain.     Marland Kitchen allopurinol (ZYLOPRIM) 100 MG tablet TAKE TWO TABLETS BY MOUTH ONCE DAILY (Patient taking differently: TAKE TWO TABLETS BY MOUTH ONCE DAILY IN THE EVENING) 180 tablet 3  . carboxymethylcellulose (REFRESH PLUS) 0.5 % SOLN Place 2 drops into both eyes daily.     . carvedilol (COREG) 12.5 MG tablet Take 1 tablet (12.5 mg total) by mouth 2 (two) times daily. 60 tablet 6  . Cholecalciferol 1000 units tablet Take 1,000 Units by mouth daily.    . colchicine 0.6 MG tablet Take 0.6 mg by mouth daily as needed (gout).     Marland Kitchen digoxin (LANOXIN) 0.125 MG  tablet Take 0.5 tablets (0.0625 mg total) by mouth daily. 15 tablet 6  . eplerenone (INSPRA) 25 MG tablet Take 1 tablet (25 mg total) by mouth daily. 30 tablet 3  . insulin detemir (LEVEMIR) 100 UNIT/ML injection Inject 5 Units into the skin 2 (two) times daily.     . insulin regular (NOVOLIN R) 100 units/mL injection Inject 5 Units into the skin daily before supper.     . Investigational - Study Medication Take 1 tablet by mouth daily. Study name: [place] Additional study details: [id number] (vericiguat) or Placebo 1 each PRN  . ipratropium (ATROVENT) 0.06 % nasal spray Place 2 sprays into both  nostrils daily.     Marland Kitchen lidocaine (LIDODERM) 5 % Place 1 patch onto the skin daily as needed for pain.    . methocarbamol (ROBAXIN) 750 MG tablet Take 750 mg by mouth 2 (two) times daily as needed for muscle spasms.    . nitroGLYCERIN (NITROSTAT) 0.4 MG SL tablet Place 1 tablet (0.4 mg total) under the tongue every 5 (five) minutes as needed for chest pain (MAX 3 TABLETS). 25 tablet 3  . omeprazole (PRILOSEC) 20 MG capsule Take 20 mg by mouth daily as needed (acid reflux).     . potassium chloride SA (K-DUR,KLOR-CON) 20 MEQ tablet Take 1 tablet (20 mEq total) by mouth daily. 90 tablet 3  . sacubitril-valsartan (ENTRESTO) 24-26 MG Take 1 tablet by mouth 2 (two) times daily. 60 tablet 3  . torsemide (DEMADEX) 20 MG tablet Take 4 tabs in AM and 2 tabs in PM 240 tablet 6  . traMADol (ULTRAM) 50 MG tablet TAKE TWO TABLETS BY MOUTH EVERY 8 HOURS AS NEEDED 180 tablet 2  . warfarin (COUMADIN) 2.5 MG tablet Take 1 tablet (2.5 mg total) by mouth See admin instructions. (Patient taking differently: Take 2.5 mg by mouth. Take 2.5 mg daily except Wednesday take 5mg  daily (take in the pm)) 120 tablet 1   No current facility-administered medications on file prior to visit.     Past Medical History:  Diagnosis Date  . AAA (abdominal aortic aneurysm) ([place])    Surgical repair 11/2002.  . Adenomatous colon polyp   . Alcohol ingestion of more than four drinks per week    Excess beer  not a dependency problem  . Aortic valve sclerosis   . Arthritis   . Atrial fibrillation ([place])    Previous long-term amiodarone therapy with multiple cardioversions / amiodarone stopped September, 2009  . Atrial flutter [facility])    Started November, 2010, Left-sided and cannot ablate  . Bony abnormality    Patient's manubrium is slightly displaced to the right  . CAD (coronary artery disease)    a. s/p CABG 2004;  b. 02/2016 Cath: LM nl, LAD 70p, 173m, D1 50, D2 50, RI 50ost, LCX 88m, OM2 100, OM3 100, RCA 70p, 100d, VG->OM3 ok,  LIMA->LAD 60ost, VG->RPDA  ok.  . 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 01/2013 MDT DTBA 1D1 [place] CRT-D, ser # [id number].  . Carotid artery disease ([place])    Doppler, December, 2013, 0-39% bilateral  . Chronic systolic CHF (congestive heart failure) ([place])    a. 02/2016 Echo: EF 35-40%, diff HK, triv AI, mildly dil Ao root 80mm, mild MR, sev dil LA, triv TR.  Marland Kitchen Chronotropic incompetence    IV pacing rate adjusted  . CKD (chronic kidney disease), stage III   . COPD (chronic obstructive pulmonary disease) ([place])   .  Dilated aortic root ([place])   . Discolored skin   . Diverticulosis   . Drug therapy    Redness and swelling with Avelox infusion May 24, 2011  . Eye abnormality    Ophthalmologist questions a clot in one of his eyes, May, 2012  . Gout   . Hyperlipidemia   . Hypertension   . Internal hemorrhoids   . Ischemic cardiomyopathy   . Left atrial thrombus    Remote past... cardioversions done since that time  . Mitral regurgitation    Mild echo  . Myocardial infarction ([place])   . Nasal drainage    Chronic  . Overweight(278.02)   . Pericardial effusion   . Pleural effusion    Large loculated effusion on the left side November, 2011. This was tapped. It was exudative. Cytology revealed no cancer no proof of mesothelioma area pulmonary team felt that no further workup was needed  . Pleural thickening   . Pneumonia   . Polycythemia vera ([place]) 07/28/2014  . SOB (shortness of breath)    Large left effusion/ thoracentesis/hospitalization/November, 2011... exudated.. cytology negative.. Dr.Wert.. no proof of mesothelioma  . Spinal stenosis    Surgery Dr.Elsner  . Thrombophlebitis of superficial veins of upper extremities    Possible venous stenosis from defibrillator  . Type II diabetes mellitus ([place]) 2008  . Venous insufficiency    Toe discoloration chronic  . Ventral hernia    April, 2014, result of his abdominal surgery  . Warfarin  anticoagulation   . Wide-complex tachycardia ([place])     Past Surgical History:  Procedure Laterality Date  . ABDOMINAL AORTIC ANEURYSM REPAIR  11/2002  . ANTERIOR CERVICAL DECOMP/DISCECTOMY FUSION  1995  . BACK SURGERY    . BI-VENTRICULAR PACEMAKER INSERTION (CRT-P)  02-11-2013   Pt with previously implanted MDT CRTD downgraded to CRTP by Dr Lovena Le 02-11-13  . CARDIAC CATHETERIZATION  "several"  . CARDIAC CATHETERIZATION N/A 03/18/2016   Procedure: RIGHT/LEFT HEART CATH AND CORONARY/GRAFT ANGIOGRAPHY;  Surgeon: Nelva Bush, MD;  Location: [place] CV LAB;  Service: Cardiovascular;  Laterality: N/A;  . CATARACT EXTRACTION W/ INTRAOCULAR LENS  IMPLANT, BILATERAL    . COLONOSCOPY W/ POLYPECTOMY    . CORONARY ANGIOPLASTY WITH STENT PLACEMENT  2004  . CORONARY ARTERY BYPASS GRAFT  2004   CABG X4  . FRACTURE SURGERY    . IMPLANTABLE CARDIOVERTER DEFIBRILLATOR (ICD) GENERATOR CHANGE N/A 02/11/2013   Procedure: ICD GENERATOR CHANGE;  Surgeon: Evans Lance, MD;  Location: [facility] CATH LAB;  Service: Cardiovascular;  Laterality: N/A;  . INCISION AND DRAINAGE ABSCESS / HEMATOMA OF BURSA / KNEE / THIGH    . INSERT / REPLACE / REMOVE PACEMAKER  2009   original pacer/defibrillator; Dr. Lovena Le 2009... by the pacing  . LUMBAR FUSION  2010  . ORIF TIBIA & FIBULA FRACTURES Right 2000s  . PILONIDAL CYST EXCISION    . RIGHT HEART CATH N/A 10/19/2016   Procedure: Right Heart Cath;  Surgeon: [name], MD;  Location: [place] CV LAB;  Service: Cardiovascular;  Laterality: N/A;  . SPINAL CORD STIMULATOR IMPLANT  12/2011  . SURGERY SCROTAL / TESTICULAR      Social History   Social History  . Marital status: Married    Spouse name: N/A  . Number of children: N/A  . Years of education: N/A   Occupational History  . Retired- Nurse, mental health    Social History Main Topics  . Smoking status: Former Smoker    Packs/day: 3.00  Years: 39.00    Types: Cigarettes    Start date:  05/24/1956    Quit date: 03/22/1995  . Smokeless tobacco: Never Used  . Alcohol use 1.2 oz/week    2 Cans of beer per week  . Drug use: No  . Sexual activity: No   Other Topics Concern  . None   Social History Narrative  . None    Family History  Problem Relation Age of Onset  . Hypertension Mother   . Stroke Mother   . Diabetes Father   . Coronary artery disease Father   . Other Father        DVT  . Diabetes Brother   . Colon cancer Neg Hx   . Heart attack Neg Hx     Review of Systems  Constitutional: Negative for appetite change, chills and fever.  Eyes: Negative for visual disturbance.  Respiratory: Negative for cough, shortness of breath and wheezing.   Cardiovascular: Positive for leg swelling (minimal, daily). Negative for chest pain and palpitations.  Gastrointestinal: Negative for abdominal pain, blood in stool, constipation, diarrhea and nausea.       No gerd  Genitourinary: Negative for dysuria and hematuria.  Musculoskeletal: Positive for back pain.  Skin: Negative for color change and rash.  Neurological: Negative for dizziness, light-headedness and headaches.  Psychiatric/Behavioral: Negative for dysphoric mood and sleep disturbance. The patient is not nervous/anxious.        Objective:   Vitals:   11/30/16 0928  BP: (!) 84/50  Pulse: (!) 56  Resp: 16  Temp: 97.9 F (36.6 C)  SpO2: 98%   Filed Weights   11/30/16 0928  Weight: 236 lb (107 kg)   Body mass index is 32.01 kg/m.  Wt Readings from Last 3 Encounters:  11/30/16 236 lb (107 kg)  11/15/16 234 lb 9.6 oz (106.4 kg)  11/08/16 234 lb (106.1 kg)     Physical Exam Constitutional: He appears well-developed and well-nourished. No distress.  HENT:  Head: Normocephalic and atraumatic.  Right Ear: External ear normal.  Left Ear: External ear normal.  Mouth/Throat: Oropharynx is clear and moist.  Normal ear canals and TM b/l  Eyes: Conjunctivae and EOM are normal.  Neck: Neck supple. No  tracheal deviation present. No thyromegaly present.  No carotid bruit  Cardiovascular: Normal rate, regular rhythm, normal heart sounds and intact distal pulses.  No murmur heard.  2+ pitting edema b/l le Pulmonary/Chest: Effort normal and breath sounds normal. No respiratory distress. He has no wheezes. He has no rales.  Abdominal: Soft. He exhibits no distension. There is no tenderness.  Genitourinary: deferred  Lymphadenopathy:   He has no cervical adenopathy.  Skin: Skin is warm and dry. He is not diaphoretic.  Psychiatric: He has a normal mood and affect. His behavior is normal.         Assessment & Plan:   Physical exam: Screening blood work  Not needed  Immunizations flu vaccine today, shingrix recommended Colonoscopy - no    longer needed due to age and medical problems Eye exams  Up to date  EKG  Up to date Exercise   None due to chronic back pain Weight  Advised weight loss Skin no concerns, sees derm Substance abuse    none  See Problem List for Assessment and Plan of chronic medical problems.   FU in 6 months

## 2016-11-30 ENCOUNTER — Encounter: Payer: Self-pay | Admitting: Internal Medicine

## 2016-11-30 ENCOUNTER — Ambulatory Visit (INDEPENDENT_AMBULATORY_CARE_PROVIDER_SITE_OTHER): Payer: Medicare Other | Admitting: Internal Medicine

## 2016-11-30 VITALS — BP 84/50 | HR 56 | Temp 97.9°F | Resp 16 | Ht 72.0 in | Wt 236.0 lb

## 2016-11-30 DIAGNOSIS — Z0000 Encounter for general adult medical examination without abnormal findings: Secondary | ICD-10-CM

## 2016-11-30 DIAGNOSIS — N183 Chronic kidney disease, stage 3 unspecified: Secondary | ICD-10-CM

## 2016-11-30 DIAGNOSIS — E784 Other hyperlipidemia: Secondary | ICD-10-CM

## 2016-11-30 DIAGNOSIS — I10 Essential (primary) hypertension: Secondary | ICD-10-CM | POA: Diagnosis not present

## 2016-11-30 DIAGNOSIS — E7849 Other hyperlipidemia: Secondary | ICD-10-CM

## 2016-11-30 DIAGNOSIS — Z794 Long term (current) use of insulin: Secondary | ICD-10-CM

## 2016-11-30 DIAGNOSIS — I5022 Chronic systolic (congestive) heart failure: Secondary | ICD-10-CM | POA: Diagnosis not present

## 2016-11-30 DIAGNOSIS — D45 Polycythemia vera: Secondary | ICD-10-CM

## 2016-11-30 DIAGNOSIS — E1142 Type 2 diabetes mellitus with diabetic polyneuropathy: Secondary | ICD-10-CM

## 2016-11-30 DIAGNOSIS — E1159 Type 2 diabetes mellitus with other circulatory complications: Secondary | ICD-10-CM

## 2016-11-30 DIAGNOSIS — Z23 Encounter for immunization: Secondary | ICD-10-CM

## 2016-11-30 MED ORDER — GABAPENTIN 300 MG PO CAPS: 600.0000 mg | ORAL_CAPSULE | Freq: Three times a day (TID) | ORAL | 3 refills | Status: AC

## 2016-11-30 MED ORDER — ATORVASTATIN CALCIUM 10 MG PO TABS: 10.0000 mg | ORAL_TABLET | Freq: Every day | ORAL | 1 refills | Status: DC

## 2016-11-30 NOTE — Assessment & Plan Note (Addendum)
Taking gabapentin 300 mg  - 2 in AM, 2 in evening and 1 at night Pain is not controlled - mostly at night Will increase gabapentin to 2 tabs ( 600 mg ) three times daily Discussed we can increase further if needed

## 2016-11-30 NOTE — Patient Instructions (Addendum)
Increase the gabapentin at night to 2 pills.  Continue all your other medications.  Continue doing brain stimulating activities (puzzles, reading, adult coloring books, staying active) to keep memory sharp.   Continue to eat heart healthy diet (full of fruits, vegetables, whole grains, lean protein, water--limit salt, fat, and sugar intake) and increase physical activity as tolerated.  [name] , Thank you for taking time to come for your Medicare Wellness Visit. I appreciate your ongoing commitment to your health goals. Please review the following plan we discussed and let me know if I can assist you in the future.   These are the goals we discussed: Goals    . Be as active and as independent as possible          Continue to do chair exercises, stretches, and walk as tolerated.        This is a list of the screening recommended for you and due dates:  Health Maintenance  Topic Date Due  . Hemoglobin A1C  01/01/2017  . Eye exam for diabetics  03/18/2017  . Urine Protein Check  09/02/2017  . Complete foot exam   09/07/2017  . Tetanus Vaccine  10/06/2024  . Flu Shot  Completed  . Pneumonia vaccines  Completed

## 2016-11-30 NOTE — Assessment & Plan Note (Signed)
Continue statin. 

## 2016-11-30 NOTE — Assessment & Plan Note (Signed)
2 + edema on exam, no change in SOB Following with Dr Aundra Dubin Has Cardiomems No need for blood work today Management per CHF clinic

## 2016-11-30 NOTE — Assessment & Plan Note (Signed)
BP on low side today - asymptomatic BP has been higher at home If BP continues to be low would advise contacting CHF clinic

## 2016-11-30 NOTE — Assessment & Plan Note (Signed)
Has been stable Has had blood work recently - no need for blood work today

## 2016-11-30 NOTE — Assessment & Plan Note (Signed)
Management per Dr Dwyane Dee  Lab Results  Component Value Date   HGBA1C 6.8 (H) 07/02/2016   Well controlled

## 2016-11-30 NOTE — Assessment & Plan Note (Signed)
Has intermittent plebotomy

## 2016-12-01 ENCOUNTER — Ambulatory Visit (HOSPITAL_COMMUNITY)
Admission: RE | Admit: 2016-12-01 | Discharge: 2016-12-01 | Disposition: A | Discharge status: Home / Self Care | Payer: Medicare Other | Source: Ambulatory Visit | Attending: Internal Medicine | Admitting: Internal Medicine

## 2016-12-01 DIAGNOSIS — I5032 Chronic diastolic (congestive) heart failure: Secondary | ICD-10-CM | POA: Insufficient documentation

## 2016-12-01 LAB — BASIC METABOLIC PANEL
Anion gap: 9 (ref 5–15)
BUN: 32 mg/dL — ABNORMAL HIGH (ref 6–20)
CO2: 25 mmol/L (ref 22–32)
CREATININE: 1.79 mg/dL — AB (ref 0.61–1.24)
Calcium: 8.8 mg/dL — ABNORMAL LOW (ref 8.9–10.3)
Chloride: 103 mmol/L (ref 101–111)
GFR calc Af Amer: 41 mL/min — ABNORMAL LOW (ref >60)
GFR, EST NON AFRICAN AMERICAN: 35 mL/min — AB (ref >60)
GLUCOSE: 172 mg/dL — AB (ref 65–99)
POTASSIUM: 4 mmol/L (ref 3.5–5.1)
SODIUM: 137 mmol/L (ref 135–145)

## 2016-12-01 NOTE — Progress Notes (Signed)
RESEARCH ENCOUNTER  Patient ID: [name]  DOB: 06-11-1940  [name] presented to the [facility] for an unscheduled visit of the [employer].  No signs/symptoms of ACS, symptomatic hypotension, nor syncope since the last visit. Subject states compliance with IP, IP returned, and additional IP dispensed.    [facility] will follow [facility] in the beginning of October for additional IP dispensing.

## 2016-12-05 ENCOUNTER — Other Ambulatory Visit: Payer: Medicare Other

## 2016-12-07 ENCOUNTER — Ambulatory Visit (INDEPENDENT_AMBULATORY_CARE_PROVIDER_SITE_OTHER): Payer: Medicare Other | Admitting: *Deleted

## 2016-12-07 DIAGNOSIS — I482 Chronic atrial fibrillation: Secondary | ICD-10-CM

## 2016-12-07 DIAGNOSIS — I4891 Unspecified atrial fibrillation: Secondary | ICD-10-CM

## 2016-12-07 DIAGNOSIS — I4821 Permanent atrial fibrillation: Secondary | ICD-10-CM

## 2016-12-07 LAB — POCT INR: INR: 2.2

## 2016-12-07 NOTE — Progress Notes (Signed)
Patient ID: [name], male   DOB: [birth year], 76 y.o.   MRN: [id number]    Reason for Appointment : F/u for Type 2 Diabetes  History of Present Illness          Diagnosis: Type 2 diabetes mellitus, date of diagnosis: 2008       Past history: He was initially treated with Metformin and also given Amayl at some point. With this he had fair control of his diabetes with A1c had usually been over 7% except once in 2013.  He was taken off metformin probably in March this year after a hospitalization, possibly because of renal dysfunction He was started on Levemir insulin in May when his blood sugars were significantly higher, fasting readings averaging 220.  Levemir progressively increased and was on 35 units at night since 12/20/12 He has not been on any other medications either orally or injectable for his diabetes Because of his A1c of over 10% in 10/14 on basal insulin alone he was started on NovoLog with each meal in 10/14  Recent history:     INSULIN regimen is described as: Levemir 5 units 2 times a day, Regular Insulin 5 units at supper  He is  on Levemir since he could not afford Toujeo and on Regular Insulin instead of Humalog also because of high cost  A1c is higher at 7.5, previously had been as low as 6% Fructosamine is excellent at 229    Current management, blood sugar patterns and problems identified:  He was having fairly good blood sugars on his last visit in June but they are overall higher now  His blood sugars are fairly consistently high most of the time except at times better in the evening after supper  He is usually eating at home in the evening and not needing as much insulin but when he is going out to eat especially with eating fried food his blood sugar can be significantly high  Only occasionally he will not take his insulin before eating when going out  Fasting readings are consistently high  Again eating only 2 meals a  day  Compliance with the medical regimen: Good   Glucose monitoring:  done  Up to 3  times a day        Glucometer:  Accu-Chek    Blood Glucose readings from download as follows  Mean values apply above for all meters except median for One Touch  PRE-MEAL Fasting Lunch Dinner Bedtime Overall  Glucose range: 140-171      Average  152     163    POST-MEAL PC Breakfast PC Lunch PC Dinner  Glucose range:  159-207  115-237  Mean/median:  179  167      Self-care: The diet that the patient has been following is: Smaller portions and relatively balanced meals  Meals: 2 meals per day. Noon and 6 pm  Low fat meals At dinnertime. Avoiding drinks with sugar and no juices        Physical activity: exercise: Minimal, limited by back pain And shortness of breath           Dietician visit: Most recent: Unknown.  CDE visit: 11/14                Weight control:    Wt Readings from Last 3 Encounters:  12/08/16 238 lb 3.2 oz (108 kg)  11/30/16 236 lb (107 kg)  11/29/16 236 lb 9.6 oz (107.3 kg)  Lab Results  Component Value Date   HGBA1C 7.5 12/08/2016   HGBA1C 6.8 (H) 07/02/2016   HGBA1C 6.0 04/11/2016   Lab Results  Component Value Date   MICROALBUR 3.3 (H) 09/02/2016   LDLCALC 71 08/10/2016   CREATININE 1.79 (H) 12/01/2016    Other active problems: See review of systems   Allergies as of 12/08/2016      Reactions   Avelox [moxifloxacin Hcl In Nacl] Swelling, Rash, Other (See Comments)   Patient became hypotensive after infusion started Because of a history of documented adverse serious drug reaction;Medi Alert bracelet  is recommended   Penicillins Anaphylaxis, Swelling, Other (See Comments)   REACTION: anaphylaxis Because of a history of documented adverse serious drug reaction;Medi Alert bracelet  is recommended Has patient had a PCN reaction causing immediate rash, facial/tongue/throat swelling, SOB or lightheadedness with hypotension: Yes Has patient had a PCN reaction  causing severe rash involving mucus membranes or skin necrosis: No Has patient had a PCN reaction that required hospitalization: Unknown Has patient had a PCN reaction occurring within the last 10 years: No      Medication List       Accurate as of 12/08/16  8:24 PM. Always use your most recent med list.          ACCU-CHEK AVIVA PLUS test strip Generic drug:  glucose blood USE 1 STRIP TO CHECK GLUCOSE THREE TIMES DAILY AS DIRECTED   ACCU-CHEK FASTCLIX LANCETS Misc USE ONE LANCET TO CHECK GLUCOSE THREE TIMES DAILY   acetaminophen 325 MG tablet Commonly known as:  TYLENOL Take 325 mg by mouth 4 (four) times daily as needed for moderate pain.   allopurinol 100 MG tablet Commonly known as:  ZYLOPRIM TAKE TWO TABLETS BY MOUTH ONCE DAILY   atorvastatin 10 MG tablet Commonly known as:  LIPITOR Take 1 tablet (10 mg total) by mouth daily.   carboxymethylcellulose 0.5 % Soln Commonly known as:  REFRESH PLUS Place 2 drops into both eyes daily.   carvedilol 12.5 MG tablet Commonly known as:  COREG Take 1 tablet (12.5 mg total) by mouth 2 (two) times daily.   Cholecalciferol 1000 units tablet Take 1,000 Units by mouth daily.   colchicine 0.6 MG tablet Take 0.6 mg by mouth daily as needed (gout).   digoxin 0.125 MG tablet Commonly known as:  LANOXIN Take 0.5 tablets (0.0625 mg total) by mouth daily.   eplerenone 25 MG tablet Commonly known as:  INSPRA Take 1 tablet (25 mg total) by mouth daily.   gabapentin 300 MG capsule Commonly known as:  NEURONTIN Take 2 capsules (600 mg total) by mouth 3 (three) times daily. TAKE ONE CAPSULE BY MOUTH 5 TIMES DAILY AS NEEDED FOR LEG PAIN   Investigational - Study Medication Take 1 tablet by mouth daily. Study name: [place] Additional study details: MK-1242 (vericiguat) or Placebo   ipratropium 0.06 % nasal spray Commonly known as:  ATROVENT Place 2 sprays into both nostrils daily.   LEVEMIR 100 UNIT/ML injection Generic drug:   insulin detemir Inject 5 Units into the skin 2 (two) times daily.   lidocaine 5 % Commonly known as:  LIDODERM Place 1 patch onto the skin daily as needed for pain.   methocarbamol 750 MG tablet Commonly known as:  ROBAXIN Take 750 mg by mouth 2 (two) times daily as needed for muscle spasms.   nitroGLYCERIN 0.4 MG SL tablet Commonly known as:  NITROSTAT Place 1 tablet (0.4 mg total) under the tongue every 5 (  five) minutes as needed for chest pain (MAX 3 TABLETS).   NOVOLIN R 100 units/mL injection Generic drug:  insulin regular Inject 5 Units into the skin daily before supper.   omeprazole 20 MG capsule Commonly known as:  PRILOSEC Take 20 mg by mouth daily as needed (acid reflux).   potassium chloride SA 20 MEQ tablet Commonly known as:  K-DUR,KLOR-CON Take 1 tablet (20 mEq total) by mouth daily.   sacubitril-valsartan 24-26 MG Commonly known as:  ENTRESTO Take 1 tablet by mouth 2 (two) times daily.   torsemide 20 MG tablet Commonly known as:  DEMADEX Take 4 tabs in AM and 2 tabs in PM   traMADol 50 MG tablet Commonly known as:  ULTRAM TAKE TWO TABLETS BY MOUTH EVERY 8 HOURS AS NEEDED   warfarin 2.5 MG tablet Commonly known as:  COUMADIN Take 1 tablet (2.5 mg total) by mouth See admin instructions.            Discharge Care Instructions        Start     Ordered   12/08/16 0000  POCT glycosylated hemoglobin (Hb A1C)     12/08/16 1359      Allergies:  Allergies  Allergen Reactions  . Avelox [Moxifloxacin Hcl In Nacl] Swelling, Rash and Other (See Comments)    Patient became hypotensive after infusion started Because of a history of documented adverse serious drug reaction;Medi Alert bracelet  is recommended  . Penicillins Anaphylaxis, Swelling and Other (See Comments)    REACTION: anaphylaxis Because of a history of documented adverse serious drug reaction;Medi Alert bracelet  is recommended Has patient had a PCN reaction causing immediate rash,  facial/tongue/throat swelling, SOB or lightheadedness with hypotension: Yes Has patient had a PCN reaction causing severe rash involving mucus membranes or skin necrosis: No Has patient had a PCN reaction that required hospitalization: Unknown Has patient had a PCN reaction occurring within the last 10 years: No     Past Medical History:  Diagnosis Date  . AAA (abdominal aortic aneurysm) ([place])    Surgical repair 11/2002.  . Adenomatous colon polyp   . Alcohol ingestion of more than four drinks per week    Excess beer  not a dependency problem  . Aortic valve sclerosis   . Arthritis   . Atrial fibrillation ([place])    Previous long-term amiodarone therapy with multiple cardioversions / amiodarone stopped September, 2009  . Atrial flutter [facility])    Started November, 2010, Left-sided and cannot ablate  . Bony abnormality    Patient's manubrium is slightly displaced to the right  . CAD (coronary artery disease)    a. s/p CABG 2004;  b. 02/2016 Cath: LM nl, LAD 70p, 146m, D1 50, D2 50, RI 50ost, LCX 31m, OM2 100, OM3 100, RCA 70p, 100d, VG->OM3 ok, LIMA->LAD 60ost, VG->RPDA  ok.  . 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 01/2013 MDT DTBA 1D1 [place] CRT-D, ser # [id number] H.  . Carotid artery disease ([place])    Doppler, December, 2013, 0-39% bilateral  . Chronic systolic CHF (congestive heart failure) ([place])    a. 02/2016 Echo: EF 35-40%, diff HK, triv AI, mildly dil Ao root 51mm, mild MR, sev dil LA, triv TR.  Marland Kitchen Chronotropic incompetence    IV pacing rate adjusted  . CKD (chronic kidney disease), stage III   . COPD (chronic obstructive pulmonary disease) ([place])   . Dilated aortic root ([place])   . Discolored skin   .  Diverticulosis   . Drug therapy    Redness and swelling with Avelox infusion May 24, 2011  . Eye abnormality    Ophthalmologist questions a clot in one of his eyes, May, 2012  . Gout   . Hyperlipidemia   . Hypertension   . Internal hemorrhoids     . Ischemic cardiomyopathy   . Left atrial thrombus    Remote past... cardioversions done since that time  . Mitral regurgitation    Mild echo  . Myocardial infarction ([place])   . Nasal drainage    Chronic  . Overweight(278.02)   . Pericardial effusion   . Pleural effusion    Large loculated effusion on the left side November, 2011. This was tapped. It was exudative. Cytology revealed no cancer no proof of mesothelioma area pulmonary team felt that no further workup was needed  . Pleural thickening   . Pneumonia   . Polycythemia vera ([place]) 07/28/2014  . SOB (shortness of breath)    Large left effusion/ thoracentesis/hospitalization/November, 2011... exudated.. cytology negative.. Dr.Wert.. no proof of mesothelioma  . Spinal stenosis    Surgery Dr.Elsner  . Thrombophlebitis of superficial veins of upper extremities    Possible venous stenosis from defibrillator  . Type II diabetes mellitus ([place]) 2008  . Venous insufficiency    Toe discoloration chronic  . Ventral hernia    April, 2014, result of his abdominal surgery  . Warfarin anticoagulation   . Wide-complex tachycardia ([place])     Past Surgical History:  Procedure Laterality Date  . ABDOMINAL AORTIC ANEURYSM REPAIR  11/2002  . ANTERIOR CERVICAL DECOMP/DISCECTOMY FUSION  1995  . BACK SURGERY    . BI-VENTRICULAR PACEMAKER INSERTION (CRT-P)  02-11-2013   Pt with previously implanted MDT CRTD downgraded to CRTP by Dr Lovena Le 02-11-13  . CARDIAC CATHETERIZATION  "several"  . CARDIAC CATHETERIZATION N/A 03/18/2016   Procedure: RIGHT/LEFT HEART CATH AND CORONARY/GRAFT ANGIOGRAPHY;  Surgeon: Nelva Bush, MD;  Location: [facility];  Service: Cardiovascular;  Laterality: N/A;  . CATARACT EXTRACTION W/ INTRAOCULAR LENS  IMPLANT, BILATERAL    . COLONOSCOPY W/ POLYPECTOMY    . CORONARY ANGIOPLASTY WITH STENT PLACEMENT  2004  . CORONARY ARTERY BYPASS GRAFT  2004   CABG X4  . FRACTURE SURGERY    . IMPLANTABLE CARDIOVERTER  DEFIBRILLATOR (ICD) GENERATOR CHANGE N/A 02/11/2013   Procedure: ICD GENERATOR CHANGE;  Surgeon: Evans Lance, MD;  Location: [facility] CATH LAB;  Service: Cardiovascular;  Laterality: N/A;  . INCISION AND DRAINAGE ABSCESS / HEMATOMA OF BURSA / KNEE / THIGH    . INSERT / REPLACE / REMOVE PACEMAKER  2009   original pacer/defibrillator; Dr. Lovena Le 2009... by the pacing  . LUMBAR FUSION  2010  . ORIF TIBIA & FIBULA FRACTURES Right 2000s  . PILONIDAL CYST EXCISION    . RIGHT HEART CATH N/A 10/19/2016   Procedure: Right Heart Cath;  Surgeon: [name], MD;  Location: [facility];  Service: Cardiovascular;  Laterality: N/A;  . SPINAL CORD STIMULATOR IMPLANT  12/2011  . SURGERY SCROTAL / TESTICULAR      Family History  Problem Relation Age of Onset  . Hypertension Mother   . Stroke Mother   . Diabetes Father   . Coronary artery disease Father   . Other Father        DVT  . Diabetes Brother   . Colon cancer Neg Hx   . Heart attack Neg Hx     Social History:  reports that he quit smoking about 21 years ago. His smoking use included Cigarettes. He started smoking about 60 years ago. He has a 117.00 pack-year smoking history. He has never used smokeless tobacco. He reports that he drinks about 1.2 oz of alcohol per week . He reports that he does not use drugs.    Review of Systems       Lipids: Treated with Lipitor And is taking 10 mg, Followed by PCP and cardiologist  LDL is under 70 more recently   Lab Results  Component Value Date   CHOL 146 08/10/2016   HDL 48 08/10/2016   LDLCALC 71 08/10/2016   TRIG 137 08/10/2016   CHOLHDL 3.0 08/10/2016       The blood pressure has been under control with current regimen of Spironolactone, Carvedilol and currently not on amlodipine and losartan  Renal dysfunction: Creatinine has stabilized  Lab Results  Component Value Date   CREATININE 1.79 (H) 12/01/2016   CREATININE 1.8 (H) 11/08/2016   CREATININE 1.50 (H) 11/07/2016    CREATININE 1.56 (H) 10/19/2016   CREATININE 2.0 (H) 10/04/2016   CREATININE 1.69 (H) 09/02/2016         Has known neuropathy: He has had Long-standing numbness, pains and tingling in his feet and lower legs.  Numbness is worse in the mornings Mostly having  Less symptoms in day Takes gabapentin for this and uses to tablet at suppertime and 1 tablet at bedtime    Physical Examination:  BP (!) 92/58 (BP Location: Left Arm, Patient Position: Sitting, Cuff Size: Normal)   Pulse (!) 55   Temp 97.7 F (36.5 C) (Oral)   Ht 6' (1.829 m)   Wt 238 lb 3.2 oz (108 kg)   SpO2 95%   BMI 32.31 kg/m       ASSESSMENT/PLAN:  Diabetes type 2 See history of present illness for detailed discussion of his current management, blood sugar patterns and problems identified  His insulin requirement appears to be increased He is getting high readings mostly fasting and in the afternoon and not consistently in the evening Variability occurs because of his dietary changes especially with eating out and high-fat meals Also occasionally may not take mealtime insulin coverage  Some of his insulin requirement increase may be related to his weight gain compared to his last visit However his diabetes is still reasonably control considering his age and comorbid condition  Recommendations: If eating out he can take extra 2 units of regular insulin at least Will increase his Levemir by 2 units twice a day and also his lunch time dose of Regular Insulin by 2 units He will call if his blood sugars are not consistently controlled  CKD: Creatinine is generally in the same range   Patient Instructions  Check blood sugars on waking up  4/7 days  Also check blood sugars about 2 hours after a meal and do this after different meals by rotation  Recommended blood sugar levels on waking up is 90-130 and about 2 hours after meal is 130-160  Please bring your blood sugar monitor to each visit, thank  you  levemir 7 twice daily  Regular insulin 7 at lunch and 5-7 at supper    [facility] 12/08/2016, 8:24 PM

## 2016-12-08 ENCOUNTER — Encounter: Payer: Self-pay | Admitting: Endocrinology

## 2016-12-08 ENCOUNTER — Ambulatory Visit (INDEPENDENT_AMBULATORY_CARE_PROVIDER_SITE_OTHER): Payer: Medicare Other | Admitting: Endocrinology

## 2016-12-08 VITALS — BP 92/58 | HR 55 | Temp 97.7°F | Ht 72.0 in | Wt 238.2 lb

## 2016-12-08 DIAGNOSIS — E1142 Type 2 diabetes mellitus with diabetic polyneuropathy: Secondary | ICD-10-CM | POA: Diagnosis not present

## 2016-12-08 DIAGNOSIS — Z794 Long term (current) use of insulin: Secondary | ICD-10-CM

## 2016-12-08 DIAGNOSIS — E1165 Type 2 diabetes mellitus with hyperglycemia: Secondary | ICD-10-CM

## 2016-12-08 LAB — POCT GLYCOSYLATED HEMOGLOBIN (HGB A1C): Hemoglobin A1C: 7.5

## 2016-12-08 NOTE — Patient Instructions (Signed)
Check blood sugars on waking up  4/7 days  Also check blood sugars about 2 hours after a meal and do this after different meals by rotation  Recommended blood sugar levels on waking up is 90-130 and about 2 hours after meal is 130-160  Please bring your blood sugar monitor to each visit, thank you  levemir 7 twice daily  Regular insulin 7 at lunch and 5-7 at supper

## 2016-12-19 DIAGNOSIS — M86171 Other acute osteomyelitis, right ankle and foot: Secondary | ICD-10-CM | POA: Diagnosis not present

## 2016-12-19 DIAGNOSIS — L97512 Non-pressure chronic ulcer of other part of right foot with fat layer exposed: Secondary | ICD-10-CM | POA: Diagnosis not present

## 2016-12-19 DIAGNOSIS — Z79899 Other long term (current) drug therapy: Secondary | ICD-10-CM | POA: Diagnosis not present

## 2016-12-19 DIAGNOSIS — L97519 Non-pressure chronic ulcer of other part of right foot with unspecified severity: Secondary | ICD-10-CM | POA: Diagnosis not present

## 2016-12-22 ENCOUNTER — Encounter: Payer: Self-pay | Admitting: *Deleted

## 2016-12-22 DIAGNOSIS — Z006 Encounter for examination for normal comparison and control in clinical research program: Secondary | ICD-10-CM

## 2016-12-22 NOTE — Progress Notes (Signed)
RESEARCH ENCOUNTER  Patient ID: [name]  DOB: [birth year]  [name] presented to the [facility] for an unscheduled visit of the [employer].  No signs/symptoms of ACS since the last visit. Subject compliant with IP, IP returned, and additional IP dispensed.    [facility].

## 2016-12-26 DIAGNOSIS — T8189XA Other complications of procedures, not elsewhere classified, initial encounter: Secondary | ICD-10-CM | POA: Diagnosis not present

## 2016-12-26 DIAGNOSIS — M86171 Other acute osteomyelitis, right ankle and foot: Secondary | ICD-10-CM | POA: Diagnosis not present

## 2016-12-26 DIAGNOSIS — M86671 Other chronic osteomyelitis, right ankle and foot: Secondary | ICD-10-CM | POA: Diagnosis not present

## 2016-12-26 DIAGNOSIS — L97514 Non-pressure chronic ulcer of other part of right foot with necrosis of bone: Secondary | ICD-10-CM | POA: Diagnosis not present

## 2016-12-27 ENCOUNTER — Other Ambulatory Visit (HOSPITAL_COMMUNITY): Payer: Self-pay | Admitting: Cardiology

## 2016-12-27 DIAGNOSIS — I872 Venous insufficiency (chronic) (peripheral): Secondary | ICD-10-CM

## 2016-12-27 MED ORDER — TORSEMIDE 20 MG PO TABS: ORAL_TABLET | ORAL | 6 refills | Status: DC

## 2017-01-02 ENCOUNTER — Other Ambulatory Visit (HOSPITAL_COMMUNITY): Payer: Self-pay | Admitting: *Deleted

## 2017-01-02 MED ORDER — TORSEMIDE 20 MG PO TABS: ORAL_TABLET | ORAL | 3 refills | Status: DC

## 2017-01-05 ENCOUNTER — Ambulatory Visit (HOSPITAL_COMMUNITY)
Admission: RE | Admit: 2017-01-05 | Discharge: 2017-01-05 | Disposition: A | Discharge status: Home / Self Care | Payer: Medicare Other | Source: Ambulatory Visit | Attending: Cardiology | Admitting: Cardiology

## 2017-01-05 DIAGNOSIS — I5032 Chronic diastolic (congestive) heart failure: Secondary | ICD-10-CM

## 2017-01-05 DIAGNOSIS — I5022 Chronic systolic (congestive) heart failure: Secondary | ICD-10-CM

## 2017-01-05 NOTE — Progress Notes (Signed)
   Cardiomems Update   Implant Date 10/19/2016   RHC PCWP 11 PA Pressures 28/14 Fick CO 4.9 Fick CI 4.92      Implant Labs 10/20/2016 Creatinine 1.56 BUN 41 Potassium  4.2  PAD Range 15-20  Current PAD 16    meds  Torsemide 80 mg/40 mg    Current Labs  12/01/2016 Creatinine 1.79 BUN 32 Potassium 4    Recommendations  I have reviewed the patients PA monitoring at least weekly and as needed.  to bring PA pressures within optimal range. Currently stable.   Nikoletta Varma NP-C  2:20 PM

## 2017-01-06 DIAGNOSIS — L97512 Non-pressure chronic ulcer of other part of right foot with fat layer exposed: Secondary | ICD-10-CM | POA: Diagnosis not present

## 2017-01-11 ENCOUNTER — Encounter (HOSPITAL_COMMUNITY): Payer: Self-pay | Admitting: Cardiology

## 2017-01-11 ENCOUNTER — Ambulatory Visit (HOSPITAL_COMMUNITY)
Admission: RE | Admit: 2017-01-11 | Discharge: 2017-01-11 | Disposition: A | Discharge status: Home / Self Care | Payer: Medicare Other | Source: Ambulatory Visit | Attending: Cardiology | Admitting: Cardiology

## 2017-01-11 VITALS — BP 122/80 | HR 60 | Wt 233.4 lb

## 2017-01-11 DIAGNOSIS — I5022 Chronic systolic (congestive) heart failure: Secondary | ICD-10-CM | POA: Diagnosis not present

## 2017-01-11 DIAGNOSIS — Z823 Family history of stroke: Secondary | ICD-10-CM | POA: Diagnosis not present

## 2017-01-11 DIAGNOSIS — I255 Ischemic cardiomyopathy: Secondary | ICD-10-CM | POA: Insufficient documentation

## 2017-01-11 DIAGNOSIS — E785 Hyperlipidemia, unspecified: Secondary | ICD-10-CM | POA: Diagnosis not present

## 2017-01-11 DIAGNOSIS — I2582 Chronic total occlusion of coronary artery: Secondary | ICD-10-CM | POA: Insufficient documentation

## 2017-01-11 DIAGNOSIS — I712 Thoracic aortic aneurysm, without rupture: Secondary | ICD-10-CM | POA: Insufficient documentation

## 2017-01-11 DIAGNOSIS — Z87891 Personal history of nicotine dependence: Secondary | ICD-10-CM | POA: Diagnosis not present

## 2017-01-11 DIAGNOSIS — Z7901 Long term (current) use of anticoagulants: Secondary | ICD-10-CM | POA: Insufficient documentation

## 2017-01-11 DIAGNOSIS — E1122 Type 2 diabetes mellitus with diabetic chronic kidney disease: Secondary | ICD-10-CM | POA: Insufficient documentation

## 2017-01-11 DIAGNOSIS — M109 Gout, unspecified: Secondary | ICD-10-CM | POA: Insufficient documentation

## 2017-01-11 DIAGNOSIS — Z7902 Long term (current) use of antithrombotics/antiplatelets: Secondary | ICD-10-CM | POA: Diagnosis not present

## 2017-01-11 DIAGNOSIS — Z8679 Personal history of other diseases of the circulatory system: Secondary | ICD-10-CM | POA: Diagnosis not present

## 2017-01-11 DIAGNOSIS — I2510 Atherosclerotic heart disease of native coronary artery without angina pectoris: Secondary | ICD-10-CM | POA: Insufficient documentation

## 2017-01-11 DIAGNOSIS — I4821 Permanent atrial fibrillation: Secondary | ICD-10-CM

## 2017-01-11 DIAGNOSIS — N183 Chronic kidney disease, stage 3 (moderate): Secondary | ICD-10-CM | POA: Diagnosis not present

## 2017-01-11 DIAGNOSIS — I482 Chronic atrial fibrillation: Secondary | ICD-10-CM | POA: Insufficient documentation

## 2017-01-11 DIAGNOSIS — Z8249 Family history of ischemic heart disease and other diseases of the circulatory system: Secondary | ICD-10-CM | POA: Insufficient documentation

## 2017-01-11 DIAGNOSIS — N644 Mastodynia: Secondary | ICD-10-CM | POA: Diagnosis not present

## 2017-01-11 DIAGNOSIS — Z833 Family history of diabetes mellitus: Secondary | ICD-10-CM | POA: Diagnosis not present

## 2017-01-11 DIAGNOSIS — Z951 Presence of aortocoronary bypass graft: Secondary | ICD-10-CM | POA: Diagnosis not present

## 2017-01-11 DIAGNOSIS — Z832 Family history of diseases of the blood and blood-forming organs and certain disorders involving the immune mechanism: Secondary | ICD-10-CM | POA: Diagnosis not present

## 2017-01-11 DIAGNOSIS — Z79899 Other long term (current) drug therapy: Secondary | ICD-10-CM | POA: Diagnosis not present

## 2017-01-11 DIAGNOSIS — Z794 Long term (current) use of insulin: Secondary | ICD-10-CM | POA: Insufficient documentation

## 2017-01-11 LAB — BASIC METABOLIC PANEL
Anion gap: 8 (ref 5–15)
BUN: 55 mg/dL — AB (ref 6–20)
CO2: 25 mmol/L (ref 22–32)
CREATININE: 2.21 mg/dL — AB (ref 0.61–1.24)
Calcium: 8.8 mg/dL — ABNORMAL LOW (ref 8.9–10.3)
Chloride: 104 mmol/L (ref 101–111)
GFR calc Af Amer: 32 mL/min — ABNORMAL LOW (ref >60)
GFR, EST NON AFRICAN AMERICAN: 27 mL/min — AB (ref >60)
GLUCOSE: 137 mg/dL — AB (ref 65–99)
POTASSIUM: 4.7 mmol/L (ref 3.5–5.1)
Sodium: 137 mmol/L (ref 135–145)

## 2017-01-11 LAB — CBC
HCT: 39.5 % (ref 39.0–52.0)
Hemoglobin: 12.8 g/dL — ABNORMAL LOW (ref 13.0–17.0)
MCH: 29.8 pg (ref 26.0–34.0)
MCHC: 32.4 g/dL (ref 30.0–36.0)
MCV: 91.9 fL (ref 78.0–100.0)
PLATELETS: 149 10*3/uL — AB (ref 150–400)
RBC: 4.3 MIL/uL (ref 4.22–5.81)
RDW: 16 % — AB (ref 11.5–15.5)
WBC: 7 10*3/uL (ref 4.0–10.5)

## 2017-01-11 LAB — DIGOXIN LEVEL: DIGOXIN LVL: 0.3 ng/mL — AB (ref 0.8–2.0)

## 2017-01-11 NOTE — Patient Instructions (Signed)
Labs drawn today (if we do not call you, then your lab work was stable)   Your physician recommends that you schedule a follow-up appointment in: 3 months with Dr. McLean    

## 2017-01-11 NOTE — Progress Notes (Signed)
PCP: Dr. Quay Burow Cardiologist: Dr Aundra Dubin   HPI: [name] is a 76 year old with history of CAD s/p CABG, permanent atrial fib, S/P AAA repair, ischemic cardiomyopathy/chronic systolic heart failure,  Medtronic CRT-D system.   Admitted 4/14 through 07/06/16 with volume overload. Diuresed with IV lasix and transitioned to torsemide 60 mg daily. HF meds adjusted and he was started on Entresto. Discharge weight was 220 pounds.  He now has a Cardiomems device. Torsemide has been increased to 80 qam/40 qpm based on Cardiomems evaluation.   He returns for followup of CHF and CAD.  He remains in atrial fibrillation (permanent).  Weight is stable. No chest pain.  Less breast pain now that he has stopped spironolactone and is on eplerenone.  No significant exertional dyspnea. 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  He was treated with doxycycline and then Bactrim.  He thinks that he had side effects from the Bactrim so he stopped it.  He says that the toe has improved considerably.  He had an episode of severe but transient lightheadedness on Monday.  No further lightheadedness.  BP 122/80 today and took all meds.   Optivol: fluid index < threshold but approaching, impedance stable.  No atrial fibrillation noted.  PADP 16 mmHg by CardioMems    Labs (5/18): K 4.7, creatinine 1.7, hgb 15.8, BNP 1001, LDL 71 Labs (8/18): K 4.3, creatinine 1.56, hgb 14.3 Labs (9/18): K 4, creatinine 1.79, digoxin 0.8  PMH: 1. CAD: s/p CABG.  - LHC (12/17): totally occluded distal RCA, totally occluded mid LAD, totally occluded OM2 and OM3.  LIMA-LAD patent with 60% ostial stenosis.  SVG-OM3 and SVG-PDA were patent. Medical management.  2. Chronic systolic CHF: Ischemic cardiomyopathy.  He has a Medtronic CRT-D device.  - Echo (12/17) with EF 35-40%, diffuse hypokinesis, normal RV size and systolic function, mild MR.  - CardioMems placed.  - Painful gynecomastia with spironolactone.   3. AAA: s/p repair.  4. Atrial fibrillation: Permanent.  5. Gout 6. PAD: Saw Dr Scot Dock with [facility] in 4/18.  Noninvasive study concerning for infra-inguinal arterial occlusive disease bilaterally but appeared to have adequate circulation to heal toe ulcer.  7. CKD stage 3.  8. Ascending aortic aneurysm: CT chest 3/18 ascending aorta 4.9 cm, arch 4.4 cm.  9. Spinal stenosis 10. Polycythemia vera 11. Hyperlipidemia 12. Type II diabetes    ROS: All systems negative except as listed in HPI, PMH and Problem List.  Social History   Social History  . Marital status: Married    Spouse name: N/A  . Number of children: N/A  . Years of education: N/A   Occupational History  . Retired- Nurse, mental health    Social History Main Topics  . Smoking status: Former Smoker    Packs/day: 3.00    Years: 39.00    Types: Cigarettes    Start date: 05/24/1956    Quit date: 03/22/1995  . Smokeless tobacco: Never Used  . Alcohol use 1.2 oz/week    2 Cans of beer per week  . Drug use: No  . Sexual activity: No   Other Topics Concern  . Not on file   Social History Narrative  . No narrative on file    Family History  Problem Relation Age of Onset  . Hypertension Mother   . Stroke Mother   . Diabetes Father   . Coronary artery disease Father   . Other Father  DVT  . Diabetes Brother   . Colon cancer Neg Hx   . Heart attack Neg Hx      Current Outpatient Prescriptions  Medication Sig Dispense Refill  . ACCU-CHEK AVIVA PLUS test strip USE 1 STRIP TO CHECK GLUCOSE THREE TIMES DAILY AS DIRECTED 100 each 3  . ACCU-CHEK FASTCLIX LANCETS MISC USE ONE LANCET TO CHECK GLUCOSE THREE TIMES DAILY 102 each 5  . acetaminophen (TYLENOL) 325 MG tablet Take 325 mg by mouth 4 (four) times daily as needed for moderate pain.     Marland Kitchen allopurinol (ZYLOPRIM) 100 MG tablet TAKE TWO TABLETS BY MOUTH ONCE DAILY (Patient taking differently: TAKE TWO TABLETS BY MOUTH ONCE DAILY IN THE EVENING) 180 tablet  3  . atorvastatin (LIPITOR) 10 MG tablet Take 1 tablet (10 mg total) by mouth daily. 90 tablet 1  . carboxymethylcellulose (REFRESH PLUS) 0.5 % SOLN Place 2 drops into both eyes daily.     . carvedilol (COREG) 12.5 MG tablet Take 1 tablet (12.5 mg total) by mouth 2 (two) times daily. 60 tablet 6  . Cholecalciferol 1000 units tablet Take 1,000 Units by mouth daily.    . colchicine 0.6 MG tablet Take 0.6 mg by mouth daily as needed (gout).     Marland Kitchen digoxin (LANOXIN) 0.125 MG tablet Take 0.5 tablets (0.0625 mg total) by mouth daily. 15 tablet 6  . eplerenone (INSPRA) 25 MG tablet Take 1 tablet (25 mg total) by mouth daily. 30 tablet 3  . gabapentin (NEURONTIN) 300 MG capsule Take 2 capsules (600 mg total) by mouth 3 (three) times daily. TAKE ONE CAPSULE BY MOUTH 5 TIMES DAILY AS NEEDED FOR LEG PAIN 540 capsule 3  . insulin detemir (LEVEMIR) 100 UNIT/ML injection Inject 5 Units into the skin 2 (two) times daily.     . insulin regular (NOVOLIN R) 100 units/mL injection Inject 5 Units into the skin daily before supper.     . Investigational - Study Medication Take 1 tablet by mouth daily. Study name: [place] Additional study details: MK-1242 (vericiguat) or Placebo 1 each PRN  . ipratropium (ATROVENT) 0.06 % nasal spray Place 2 sprays into both nostrils daily.     Marland Kitchen lidocaine (LIDODERM) 5 % Place 1 patch onto the skin daily as needed for pain.    . methocarbamol (ROBAXIN) 750 MG tablet Take 750 mg by mouth 2 (two) times daily as needed for muscle spasms.    . nitroGLYCERIN (NITROSTAT) 0.4 MG SL tablet Place 1 tablet (0.4 mg total) under the tongue every 5 (five) minutes as needed for chest pain (MAX 3 TABLETS). 25 tablet 3  . omeprazole (PRILOSEC) 20 MG capsule Take 20 mg by mouth daily as needed (acid reflux).     . potassium chloride SA (K-DUR,KLOR-CON) 20 MEQ tablet Take 1 tablet (20 mEq total) by mouth daily. 90 tablet 3  . sacubitril-valsartan (ENTRESTO) 24-26 MG Take 1 tablet by mouth 2 (two) times  daily. 60 tablet 3  . sulfamethoxazole-trimethoprim (BACTRIM DS,SEPTRA DS) 800-160 MG tablet Take 1 tablet by mouth 2 (two) times daily.    Marland Kitchen torsemide (DEMADEX) 20 MG tablet Take 4 tabs in AM and 2 tabs in PM 540 tablet 3  . traMADol (ULTRAM) 50 MG tablet TAKE TWO TABLETS BY MOUTH EVERY 8 HOURS AS NEEDED 180 tablet 2  . warfarin (COUMADIN) 2.5 MG tablet Take 1 tablet (2.5 mg total) by mouth See admin instructions. (Patient taking differently: Take 2.5 mg by mouth. Take 2.5 mg daily except  Wednesday take 5mg  daily (take in the pm)) 120 tablet 1   No current facility-administered medications for this encounter.     Vitals:   01/11/17 1350  BP: 122/80  Pulse: 60  SpO2: 98%  Weight: 233 lb 6.4 oz (105.9 kg)    PHYSICAL EXAM: General: NAD Neck: No JVD, no thyromegaly or thyroid nodule.  Lungs: Clear to auscultation bilaterally with normal respiratory effort. CV: Nondisplaced PMI.  Heart irregular S1/S2, no S3/S4, no murmur.  No peripheral edema.  No carotid bruit.  Difficult to palpate pedal pulses.  Abdomen: Soft, nontender, no hepatosplenomegaly, no distention.  Skin: Intact without lesions or rashes.  Neurologic: Alert and oriented x 3.  Psych: Normal affect. Extremities: No clubbing or cyanosis. Right foot in boot.  HEENT: Normal.    ASSESSMENT & PLAN: 1. Chronic Systolic Heart Failure: Ischemic cardiomyopathy. Echo 12/17 with EF 35-40%. Medtronic CRT-D.  NYHA class II.  Optivol and CardioMems both suggest stable volume.  - Goal PADP for CardioMems will be 15-20.  - Continue torsemide 80 qam/40 qpm but will need to check BMET today to make sure renal function stable.  - Continue digoxin, check level today.  - Continue current Entresto, Coreg, eplerenone.  2. Atrial fibrillation: Permanent.  Rate-controlled.  Continue warfarin. CBC today.  3. CAD: s/p CABG.  No chest pain.  - No ASA given warfarin use.  - Continue statin.  Good lipids 5/18.   4. CKD Stage III: BMET today.  5.  Gout: stable. Continue allopurinol.  6. Ascending aortic aneurysm: 3/18 CTA 4.9 cm ascending aorta.  Repeat CTA in 3/19, he will follow with Dr. Cyndia Bent for this.   Followup in 3 months.   Loralie Champagne  01/11/2017

## 2017-01-12 ENCOUNTER — Telehealth (HOSPITAL_COMMUNITY): Payer: Self-pay | Admitting: *Deleted

## 2017-01-12 MED ORDER — TORSEMIDE 20 MG PO TABS: ORAL_TABLET | ORAL | 3 refills | Status: DC

## 2017-01-12 NOTE — Telephone Encounter (Signed)
-----   Message from [name], MD sent at 01/11/2017  4:00 PM EDT ----- Creatinine is up.  Hold torsemide for 1 day, then take torsemide 80 mg daily for 2 days after that, then can add back torsemide 40 qpm every other evening after that.

## 2017-01-17 ENCOUNTER — Ambulatory Visit (INDEPENDENT_AMBULATORY_CARE_PROVIDER_SITE_OTHER): Payer: Medicare Other | Admitting: Pharmacist

## 2017-01-17 ENCOUNTER — Encounter: Payer: Self-pay | Admitting: *Deleted

## 2017-01-17 DIAGNOSIS — I4891 Unspecified atrial fibrillation: Secondary | ICD-10-CM | POA: Diagnosis not present

## 2017-01-17 DIAGNOSIS — Z006 Encounter for examination for normal comparison and control in clinical research program: Secondary | ICD-10-CM

## 2017-01-17 DIAGNOSIS — I482 Chronic atrial fibrillation: Secondary | ICD-10-CM

## 2017-01-17 DIAGNOSIS — I4821 Permanent atrial fibrillation: Secondary | ICD-10-CM

## 2017-01-17 LAB — POCT INR: INR: 2.4

## 2017-01-17 NOTE — Progress Notes (Signed)
RESEARCH ENCOUNTER  Patient ID: [name]  DOB: June 02, 1940  [name] presented to the [facility] for Visit 5/Month 4 appointment of the [facility].  No signs/symptoms of ACS since the last visit. Subject compliant with IP, IP returned, and additional IP dispensed.  Labs, survey tablet, and EKG completed.  [facility] 4 Months.

## 2017-01-18 DIAGNOSIS — M86171 Other acute osteomyelitis, right ankle and foot: Secondary | ICD-10-CM | POA: Diagnosis not present

## 2017-01-19 ENCOUNTER — Telehealth (HOSPITAL_COMMUNITY): Payer: Self-pay

## 2017-01-19 DIAGNOSIS — I5022 Chronic systolic (congestive) heart failure: Secondary | ICD-10-CM

## 2017-01-19 NOTE — Telephone Encounter (Signed)
Per Amy Clegg NP-C... Cardiomems elevated.   Increase torsemide to 80 mg twice a day for 2 days then he will take resume previous diuretic regimen.   Check BMET next week.   LVMTCB to go over this information.  Renee Pain, RN

## 2017-01-20 NOTE — Telephone Encounter (Signed)
Wife verbally confirms understanding of instructions. Lab scheduled for next Friday 11/9.  Renee Pain, RN

## 2017-01-27 ENCOUNTER — Ambulatory Visit (HOSPITAL_COMMUNITY)
Admission: RE | Admit: 2017-01-27 | Discharge: 2017-01-27 | Disposition: A | Discharge status: Home / Self Care | Payer: Medicare Other | Source: Ambulatory Visit | Attending: Cardiology | Admitting: Cardiology

## 2017-01-27 DIAGNOSIS — I5022 Chronic systolic (congestive) heart failure: Secondary | ICD-10-CM | POA: Insufficient documentation

## 2017-01-27 LAB — BASIC METABOLIC PANEL
Anion gap: 6 (ref 5–15)
BUN: 43 mg/dL — AB (ref 6–20)
CHLORIDE: 104 mmol/L (ref 101–111)
CO2: 28 mmol/L (ref 22–32)
CREATININE: 1.71 mg/dL — AB (ref 0.61–1.24)
Calcium: 8.9 mg/dL (ref 8.9–10.3)
GFR calc non Af Amer: 37 mL/min — ABNORMAL LOW (ref >60)
GFR, EST AFRICAN AMERICAN: 43 mL/min — AB (ref >60)
Glucose, Bld: 129 mg/dL — ABNORMAL HIGH (ref 65–99)
POTASSIUM: 4 mmol/L (ref 3.5–5.1)
Sodium: 138 mmol/L (ref 135–145)

## 2017-01-30 ENCOUNTER — Other Ambulatory Visit: Payer: Self-pay

## 2017-01-30 ENCOUNTER — Other Ambulatory Visit (HOSPITAL_BASED_OUTPATIENT_CLINIC_OR_DEPARTMENT_OTHER): Payer: Medicare Other

## 2017-01-30 ENCOUNTER — Encounter: Payer: Self-pay | Admitting: Hematology & Oncology

## 2017-01-30 ENCOUNTER — Ambulatory Visit (HOSPITAL_BASED_OUTPATIENT_CLINIC_OR_DEPARTMENT_OTHER): Payer: Medicare Other | Admitting: Hematology & Oncology

## 2017-01-30 ENCOUNTER — Other Ambulatory Visit: Payer: Self-pay | Admitting: Internal Medicine

## 2017-01-30 VITALS — BP 97/45 | HR 52 | Temp 97.7°F | Resp 18 | Wt 236.0 lb

## 2017-01-30 DIAGNOSIS — Z7901 Long term (current) use of anticoagulants: Secondary | ICD-10-CM | POA: Diagnosis not present

## 2017-01-30 DIAGNOSIS — E119 Type 2 diabetes mellitus without complications: Secondary | ICD-10-CM

## 2017-01-30 DIAGNOSIS — D45 Polycythemia vera: Secondary | ICD-10-CM | POA: Diagnosis not present

## 2017-01-30 DIAGNOSIS — I482 Chronic atrial fibrillation: Secondary | ICD-10-CM

## 2017-01-30 DIAGNOSIS — N289 Disorder of kidney and ureter, unspecified: Secondary | ICD-10-CM | POA: Diagnosis not present

## 2017-01-30 LAB — CBC WITH DIFFERENTIAL (CANCER CENTER ONLY)
BASO#: 0 10*3/uL (ref 0.0–0.2)
BASO%: 0.3 % (ref 0.0–2.0)
EOS%: 0.5 % (ref 0.0–7.0)
Eosinophils Absolute: 0 10*3/uL (ref 0.0–0.5)
HCT: 42.5 % (ref 38.7–49.9)
HGB: 14.1 g/dL (ref 13.0–17.1)
LYMPH#: 1 10*3/uL (ref 0.9–3.3)
LYMPH%: 12.8 % — AB (ref 14.0–48.0)
MCH: 31.1 pg (ref 28.0–33.4)
MCHC: 33.2 g/dL (ref 32.0–35.9)
MCV: 94 fL (ref 82–98)
MONO#: 0.5 10*3/uL (ref 0.1–0.9)
MONO%: 6.8 % (ref 0.0–13.0)
NEUT#: 6.3 10*3/uL (ref 1.5–6.5)
NEUT%: 79.6 % (ref 40.0–80.0)
PLATELETS: 159 10*3/uL (ref 145–400)
RBC: 4.53 10*6/uL (ref 4.20–5.70)
RDW: 16.3 % — ABNORMAL HIGH (ref 11.1–15.7)
WBC: 7.9 10*3/uL (ref 4.0–10.0)

## 2017-01-30 LAB — CMP (CANCER CENTER ONLY)
ALT: 19 U/L (ref 10–47)
AST: 14 U/L (ref 11–38)
Albumin: 3.3 g/dL (ref 3.3–5.5)
Alkaline Phosphatase: 68 U/L (ref 26–84)
BUN: 43 mg/dL — AB (ref 7–22)
CHLORIDE: 101 meq/L (ref 98–108)
CO2: 28 mEq/L (ref 18–33)
CREATININE: 2.1 mg/dL — AB (ref 0.6–1.2)
Calcium: 9.4 mg/dL (ref 8.0–10.3)
Glucose, Bld: 159 mg/dL — ABNORMAL HIGH (ref 73–118)
Potassium: 4.3 mEq/L (ref 3.3–4.7)
SODIUM: 148 meq/L — AB (ref 128–145)
TOTAL PROTEIN: 6.3 g/dL — AB (ref 6.4–8.1)
Total Bilirubin: 0.8 mg/dl (ref 0.20–1.60)

## 2017-01-30 NOTE — Progress Notes (Signed)
Hematology and Oncology Follow Up Visit  [name] [id number] April 22, 1940 76 y.o. 01/30/2017   Principle Diagnosis:  Polycythemia vera  Current Therapy:   Phlebotomy to maintain hematocrit below 45%. Aspirin 81 mg by mouth weekly Coumadin for chronic atrial fibrillation   Interim History:  [name] is here today with his wife for follow-up.  He is doing okay.  He has had no heart issues.  He is on Coumadin for chronic atrial fibrillation.  I do not think he has been in the hospital since we last saw him.  He does have diabetes.  He does have renal insufficiency.  He is on quite a few medications.  He has had no fever.  He has had no cough.  Said no change in bowel or bladder habits.  Overall, his performance status is ECOG 1.  Medications:  Allergies as of 01/30/2017      Reactions   Avelox [moxifloxacin Hcl In Nacl] Swelling, Rash, Other (See Comments)   Patient became hypotensive after infusion started Because of a history of documented adverse serious drug reaction;Medi Alert bracelet  is recommended   Penicillins Anaphylaxis, Swelling, Other (See Comments)   REACTION: anaphylaxis Because of a history of documented adverse serious drug reaction;Medi Alert bracelet  is recommended Has patient had a PCN reaction causing immediate rash, facial/tongue/throat swelling, SOB or lightheadedness with hypotension: Yes Has patient had a PCN reaction causing severe rash involving mucus membranes or skin necrosis: No Has patient had a PCN reaction that required hospitalization: Unknown Has patient had a PCN reaction occurring within the last 10 years: No      Medication List        Accurate as of 01/30/17  1:53 PM. Always use your most recent med list.          ACCU-CHEK AVIVA PLUS test strip Generic drug:  glucose blood USE 1 STRIP TO CHECK GLUCOSE THREE TIMES DAILY AS DIRECTED   ACCU-CHEK FASTCLIX LANCETS Misc USE ONE LANCET TO CHECK GLUCOSE THREE TIMES DAILY   acetaminophen 325 MG tablet Commonly known as:  TYLENOL Take 325 mg by mouth 4 (four) times daily as needed for moderate pain.   allopurinol 100 MG tablet Commonly known as:  ZYLOPRIM TAKE TWO TABLETS BY MOUTH ONCE DAILY   atorvastatin 10 MG tablet Commonly known as:  LIPITOR Take 1 tablet (10 mg total) by mouth daily.   carboxymethylcellulose 0.5 % Soln Commonly known as:  REFRESH PLUS Place 2 drops into both eyes daily.   carvedilol 12.5 MG tablet Commonly known as:  COREG Take 1 tablet (12.5 mg total) by mouth 2 (two) times daily.   Cholecalciferol 1000 units tablet Take 1,000 Units by mouth daily.   colchicine 0.6 MG tablet Take 0.6 mg by mouth daily as needed (gout).   digoxin 0.125 MG tablet Commonly known as:  LANOXIN Take 0.5 tablets (0.0625 mg total) by mouth daily.   doxycycline 100 MG capsule Commonly known as:  VIBRAMYCIN   eplerenone 25 MG tablet Commonly known as:  INSPRA Take 1 tablet (25 mg total) by mouth daily.   gabapentin 300 MG capsule Commonly known as:  NEURONTIN Take 2 capsules (600 mg total) by mouth 3 (three) times daily. TAKE ONE CAPSULE BY MOUTH 5 TIMES DAILY AS NEEDED FOR LEG PAIN   Investigational - Study Medication Take 1 tablet by mouth daily. Study name: [place] Additional study details: MK-1242 (vericiguat) or Placebo   ipratropium 0.06 % nasal spray Commonly known as:  ATROVENT Place 2 sprays into both nostrils daily.   LEVEMIR 100 UNIT/ML injection Generic drug:  insulin detemir Inject 5 Units into the skin 2 (two) times daily.   lidocaine 5 % Commonly known as:  LIDODERM Place 1 patch onto the skin daily as needed for pain.   methocarbamol 750 MG tablet Commonly known as:  ROBAXIN Take 750 mg by mouth 2 (two) times daily as needed for muscle spasms.   nitroGLYCERIN 0.4 MG SL tablet Commonly known as:  NITROSTAT Place 1 tablet (0.4 mg total) under the tongue every 5 (five) minutes as needed for chest pain (MAX 3  TABLETS).   NOVOLIN R 100 units/mL injection Generic drug:  insulin regular Inject 5 Units into the skin daily before supper.   omeprazole 20 MG capsule Commonly known as:  PRILOSEC Take 20 mg by mouth daily as needed (acid reflux).   potassium chloride SA 20 MEQ tablet Commonly known as:  K-DUR,KLOR-CON Take 1 tablet (20 mEq total) by mouth daily.   sacubitril-valsartan 24-26 MG Commonly known as:  ENTRESTO Take 1 tablet by mouth 2 (two) times daily.   SSD 1 % cream Generic drug:  silver sulfADIAZINE   sulfamethoxazole-trimethoprim 800-160 MG tablet Commonly known as:  BACTRIM DS,SEPTRA DS Take 1 tablet by mouth 2 (two) times daily.   torsemide 20 MG tablet Commonly known as:  DEMADEX Take 4 tabs in AM daily and take 2 tabs every other PM   traMADol 50 MG tablet Commonly known as:  ULTRAM TAKE TWO TABLETS BY MOUTH EVERY 8 HOURS AS NEEDED   warfarin 2.5 MG tablet Commonly known as:  COUMADIN Take as directed by the anticoagulation clinic. If you are unsure how to take this medication, talk to your nurse or doctor. Original instructions:  Take 1 tablet (2.5 mg total) by mouth See admin instructions.       Allergies:  Allergies  Allergen Reactions  . Avelox [Moxifloxacin Hcl In Nacl] Swelling, Rash and Other (See Comments)    Patient became hypotensive after infusion started Because of a history of documented adverse serious drug reaction;Medi Alert bracelet  is recommended  . Penicillins Anaphylaxis, Swelling and Other (See Comments)    REACTION: anaphylaxis Because of a history of documented adverse serious drug reaction;Medi Alert bracelet  is recommended Has patient had a PCN reaction causing immediate rash, facial/tongue/throat swelling, SOB or lightheadedness with hypotension: Yes Has patient had a PCN reaction causing severe rash involving mucus membranes or skin necrosis: No Has patient had a PCN reaction that required hospitalization: Unknown Has patient  had a PCN reaction occurring within the last 10 years: No     Past Medical History, Surgical history, Social history, and Family History were reviewed and updated.  Review of Systems:   Physical Exam:  weight is 236 lb (107 kg). His oral temperature is 97.7 F (36.5 C). His blood pressure is 97/45 (abnormal) and his pulse is 52 (abnormal). His respiration is 18 and oxygen saturation is 100%.   Wt Readings from Last 3 Encounters:  01/30/17 236 lb (107 kg)  01/17/17 234 lb (106.1 kg)  01/11/17 233 lb 6.4 oz (105.9 kg)    Obese white male in no obvious distress.  Head exam shows no ocular or oral lesions.  He has no conjunctival inflammation.  There is no facial plethora.  There is no adenopathy in the neck.  Lungs are clear.  Cardiac exam regular rate and rhythm consistent with atrial fibrillation.  There are no  murmurs.  Abdomen is soft.  Abdomen is obese.  Bowel sounds are present.  There is no fluid wave.  There is no palpable liver or spleen tip.  Back exam shows no tenderness over the spine, ribs or hips.  Extremities shows chronic 1+ edema in his legs.  He does have some compression stockings on.  Skin exam shows some scattered ecchymoses.    Lab Results  Component Value Date   WBC 7.9 01/30/2017   HGB 14.1 01/30/2017   HCT 42.5 01/30/2017   MCV 94 01/30/2017   PLT 159 01/30/2017   Lab Results  Component Value Date   FERRITIN 56 11/08/2016   IRON 101 11/08/2016   TIBC 391 11/08/2016   UIBC 290 11/08/2016   IRONPCTSAT 26 11/08/2016   Lab Results  Component Value Date   RETICCTPCT 1.4 04/02/2014   RBC 4.53 01/30/2017   RETICCTABS 75.2 04/02/2014   No results found for: KPAFRELGTCHN, LAMBDASER, KAPLAMBRATIO No results found for: IGGSERUM, IGA, IGMSERUM No results found for: Kathrynn Ducking, MSPIKE, SPEI   Chemistry      Component Value Date/Time   NA 148 (H) 01/30/2017 1255   NA 141 03/09/2016 1320   K 4.3 01/30/2017 1255     K 4.5 03/09/2016 1320   CL 101 01/30/2017 1255   CO2 28 01/30/2017 1255   CO2 25 03/09/2016 1320   BUN 43 (H) 01/30/2017 1255   BUN 28.8 (H) 03/09/2016 1320   CREATININE 2.1 (H) 01/30/2017 1255   CREATININE 1.9 (H) 03/09/2016 1320      Component Value Date/Time   CALCIUM 9.4 01/30/2017 1255   CALCIUM 9.2 03/09/2016 1320   ALKPHOS 68 01/30/2017 1255   ALKPHOS 97 03/09/2016 1320   AST 14 01/30/2017 1255   AST 13 03/09/2016 1320   ALT 19 01/30/2017 1255   ALT 10 03/09/2016 1320   BILITOT 0.80 01/30/2017 1255   BILITOT 0.88 03/09/2016 1320      Impression and Plan: Mr. Ballen is a very pleasant 76 yo caucasian gentleman with polycythemia. He continues to do well and has no complaints at this time.   He does not need to be phlebotomized today.  I am quite happy with this.  We will plan to get him back in another 3 months.  We will try to get him through the holidays and the wintertime.  His renal function is low.  This will be his biggest problem in the future.    Volanda Napoleon, MD 11/12/20181:53 PM

## 2017-01-31 ENCOUNTER — Other Ambulatory Visit: Payer: Self-pay | Admitting: Endocrinology

## 2017-01-31 DIAGNOSIS — Z794 Long term (current) use of insulin: Principal | ICD-10-CM

## 2017-01-31 DIAGNOSIS — E113291 Type 2 diabetes mellitus with mild nonproliferative diabetic retinopathy without macular edema, right eye: Secondary | ICD-10-CM | POA: Diagnosis not present

## 2017-01-31 DIAGNOSIS — E1165 Type 2 diabetes mellitus with hyperglycemia: Secondary | ICD-10-CM

## 2017-01-31 DIAGNOSIS — H353132 Nonexudative age-related macular degeneration, bilateral, intermediate dry stage: Secondary | ICD-10-CM | POA: Diagnosis not present

## 2017-01-31 DIAGNOSIS — Z961 Presence of intraocular lens: Secondary | ICD-10-CM | POA: Diagnosis not present

## 2017-01-31 DIAGNOSIS — H52203 Unspecified astigmatism, bilateral: Secondary | ICD-10-CM | POA: Diagnosis not present

## 2017-01-31 LAB — IRON AND TIBC
%SAT: 28 % (ref 20–55)
IRON: 88 ug/dL (ref 42–163)
TIBC: 318 ug/dL (ref 202–409)
UIBC: 230 ug/dL (ref 117–376)

## 2017-01-31 LAB — FERRITIN: FERRITIN: 51 ng/mL (ref 22–316)

## 2017-01-31 LAB — HM DIABETES EYE EXAM

## 2017-02-01 ENCOUNTER — Ambulatory Visit (INDEPENDENT_AMBULATORY_CARE_PROVIDER_SITE_OTHER): Payer: Medicare Other | Admitting: *Deleted

## 2017-02-01 DIAGNOSIS — H903 Sensorineural hearing loss, bilateral: Secondary | ICD-10-CM | POA: Diagnosis not present

## 2017-02-01 DIAGNOSIS — I255 Ischemic cardiomyopathy: Secondary | ICD-10-CM

## 2017-02-01 NOTE — Progress Notes (Signed)
Remote ICD transmission.   

## 2017-02-02 ENCOUNTER — Other Ambulatory Visit (HOSPITAL_COMMUNITY): Payer: Self-pay | Admitting: Adult Health

## 2017-02-02 LAB — CUP PACEART REMOTE DEVICE CHECK
Battery Remaining Longevity: 55 mo
Battery Voltage: 2.97 V
Brady Statistic AS VP Percent: 45.35 %
Brady Statistic AS VS Percent: 54.65 %
HIGH POWER IMPEDANCE MEASURED VALUE: 68 Ohm
Implantable Lead Implant Date: 20090121
Implantable Lead Implant Date: 20090121
Implantable Lead Location: 753858
Implantable Lead Location: 753860
Implantable Lead Model: 5076
Implantable Lead Model: 6935
Implantable Pulse Generator Implant Date: 20141124
Lead Channel Impedance Value: 380 Ohm
Lead Channel Impedance Value: 4047 Ohm
Lead Channel Impedance Value: 437 Ohm
Lead Channel Impedance Value: 456 Ohm
Lead Channel Sensing Intrinsic Amplitude: 11.625 mV
Lead Channel Sensing Intrinsic Amplitude: 11.625 mV
Lead Channel Sensing Intrinsic Amplitude: 3.75 mV
Lead Channel Sensing Intrinsic Amplitude: 3.75 mV
MDC IDC LEAD IMPLANT DT: 20090121
MDC IDC LEAD LOCATION: 753859
MDC IDC MSMT LEADCHNL LV IMPEDANCE VALUE: 4047 Ohm
MDC IDC MSMT LEADCHNL RA IMPEDANCE VALUE: 494 Ohm
MDC IDC MSMT LEADCHNL RV PACING THRESHOLD AMPLITUDE: 0.625 V
MDC IDC MSMT LEADCHNL RV PACING THRESHOLD PULSEWIDTH: 0.4 ms
MDC IDC SESS DTM: 20181114081704
MDC IDC SET LEADCHNL RV PACING AMPLITUDE: 2.5 V
MDC IDC SET LEADCHNL RV PACING PULSEWIDTH: 0.4 ms
MDC IDC SET LEADCHNL RV SENSING SENSITIVITY: 0.3 mV
MDC IDC STAT BRADY AP VP PERCENT: 0 %
MDC IDC STAT BRADY AP VS PERCENT: 0 %
MDC IDC STAT BRADY RA PERCENT PACED: 0 %
MDC IDC STAT BRADY RV PERCENT PACED: 55.16 %

## 2017-02-03 ENCOUNTER — Other Ambulatory Visit: Payer: Medicare Other

## 2017-02-03 ENCOUNTER — Encounter: Payer: Self-pay | Admitting: Cardiology

## 2017-02-03 DIAGNOSIS — Z794 Long term (current) use of insulin: Secondary | ICD-10-CM | POA: Diagnosis not present

## 2017-02-03 DIAGNOSIS — E1165 Type 2 diabetes mellitus with hyperglycemia: Secondary | ICD-10-CM | POA: Diagnosis not present

## 2017-02-04 LAB — FRUCTOSAMINE: FRUCTOSAMINE: 261 umol/L (ref 0–285)

## 2017-02-06 ENCOUNTER — Ambulatory Visit (HOSPITAL_COMMUNITY)
Admission: RE | Admit: 2017-02-06 | Discharge: 2017-02-06 | Disposition: A | Discharge status: Home / Self Care | Payer: Medicare Other | Source: Ambulatory Visit | Attending: Cardiology | Admitting: Cardiology

## 2017-02-06 DIAGNOSIS — I5022 Chronic systolic (congestive) heart failure: Secondary | ICD-10-CM | POA: Diagnosis not present

## 2017-02-06 NOTE — Progress Notes (Signed)
   Cardiomems Update   Implant Date 10/19/2016  RHC PCWP 11 PA Pressures 28/14 Fick CO 4.9 Fick CI 4.92    Implant Labs 10/20/2016 Creatinine 1.56 BUN 41 Potassium  4.2   PAD Range 15-20  Current PAD 17   meds  Torsemide 80 mg/40 mg    Current Labs  01/30/2017 Creatinine 2.1 BUN 43 K 4.3   I have reviewed current PAD from cardiomems device. PAD reading stable.   Continue current diuretic regimen.    Amy Clegg NP-C  12:15 PM

## 2017-02-07 ENCOUNTER — Ambulatory Visit: Payer: Medicare Other | Admitting: Endocrinology

## 2017-02-07 ENCOUNTER — Encounter: Payer: Self-pay | Admitting: Internal Medicine

## 2017-02-07 ENCOUNTER — Encounter: Payer: Self-pay | Admitting: Endocrinology

## 2017-02-07 VITALS — BP 124/80 | HR 49 | Ht 72.0 in | Wt 238.4 lb

## 2017-02-07 DIAGNOSIS — Z794 Long term (current) use of insulin: Secondary | ICD-10-CM

## 2017-02-07 DIAGNOSIS — E1165 Type 2 diabetes mellitus with hyperglycemia: Secondary | ICD-10-CM

## 2017-02-07 NOTE — Progress Notes (Signed)
Patient ID: [name], male   DOB: 1940-07-13, 76 y.o.   MRN: [id number]    Reason for Appointment : Follow-up for Type 2 Diabetes  History of Present Illness          Diagnosis: Type 2 diabetes mellitus, date of diagnosis: 2008       Past history: He was initially treated with Metformin and also given Amayl at some point. With this he had fair control of his diabetes with A1c had usually been over 7% except once in 2013.  He was taken off metformin probably in March this year after a hospitalization, possibly because of renal dysfunction He was started on Levemir insulin in May when his blood sugars were significantly higher, fasting readings averaging 220.  Levemir progressively increased and was on 35 units at night since 12/20/12 He has not been on any other medications either orally or injectable for his diabetes Because of his A1c of over 10% in 10/14 on basal insulin alone he was started on NovoLog with each meal in 10/14  Recent history:     INSULIN regimen is described as: Levemir 7 units 2 times a day, Regular Insulin 5 units at supper  A1c is higher at 7.5 in September, previously had been as low as 6% Fructosamine is 261 although has been as low as 225 before    Current management, blood sugar patterns and problems identified:  He was having high readings on his last visit especially fasting invariably after meals without any change in diet or weight  With increasing the Levemir to 7 units instead of 5 units his fasting readings are reportedly improved and near normal although he did not bring his monitor for download today  He says he is usually eating out at lunch and this will generally include some carbohydrate or starchy vegetables  Although he was told to take additional 2 units of regular insulin when eating out he is only taking 5 units at suppertime on a regular basis  He thinks his postprandial readings are about 160 after both his meals  He  is compliant with taking Levemir in the morning on waking up and at bedtime  Again eating only 2 meals a day  Compliance with the medical regimen: Good   Glucose monitoring:  done  Up to 3  times a day        Glucometer:  Accu-Chek    Blood Glucose readings from recall:  FASTING: 110-125 After lunch 160 After dinner: 160   Self-care: The diet that the patient has been following is: Smaller portions and relatively balanced meals  Meals: 2 meals per day. Noon and 6 pm  Low fat meals At dinnertime. Avoiding drinks with sugar and no juices        Physical activity: exercise: Minimal, limited by back pain And shortness of breath           Dietician visit: Most recent: Unknown.  CDE visit: 11/14                Weight control:    Wt Readings from Last 3 Encounters:  02/07/17 238 lb 6.4 oz (108.1 kg)  01/30/17 236 lb (107 kg)  01/17/17 234 lb (106.1 kg)    Lab Results  Component Value Date   HGBA1C 7.5 12/08/2016   HGBA1C 6.8 (H) 07/02/2016   HGBA1C 6.0 04/11/2016   Lab Results  Component Value Date   MICROALBUR 3.3 (H) 09/02/2016   LDLCALC 71  08/10/2016   CREATININE 2.1 (H) 01/30/2017    Other active problems: See review of systems   Allergies as of 02/07/2017      Reactions   Avelox [moxifloxacin Hcl In Nacl] Swelling, Rash, Other (See Comments)   Patient became hypotensive after infusion started Because of a history of documented adverse serious drug reaction;Medi Alert bracelet  is recommended   Penicillins Anaphylaxis, Swelling, Other (See Comments)   REACTION: anaphylaxis Because of a history of documented adverse serious drug reaction;Medi Alert bracelet  is recommended Has patient had a PCN reaction causing immediate rash, facial/tongue/throat swelling, SOB or lightheadedness with hypotension: Yes Has patient had a PCN reaction causing severe rash involving mucus membranes or skin necrosis: No Has patient had a PCN reaction that required hospitalization:  Unknown Has patient had a PCN reaction occurring within the last 10 years: No      Medication List        Accurate as of 02/07/17  1:45 PM. Always use your most recent med list.          ACCU-CHEK AVIVA PLUS test strip Generic drug:  glucose blood USE 1 STRIP TO CHECK GLUCOSE THREE TIMES DAILY AS DIRECTED   ACCU-CHEK FASTCLIX LANCETS Misc USE ONE LANCET TO CHECK GLUCOSE THREE TIMES DAILY   acetaminophen 325 MG tablet Commonly known as:  TYLENOL Take 325 mg by mouth 4 (four) times daily as needed for moderate pain.   allopurinol 100 MG tablet Commonly known as:  ZYLOPRIM TAKE TWO TABLETS BY MOUTH ONCE DAILY   atorvastatin 10 MG tablet Commonly known as:  LIPITOR Take 1 tablet (10 mg total) by mouth daily.   carboxymethylcellulose 0.5 % Soln Commonly known as:  REFRESH PLUS Place 2 drops into both eyes daily.   carvedilol 12.5 MG tablet Commonly known as:  COREG Take 1 tablet (12.5 mg total) by mouth 2 (two) times daily.   Cholecalciferol 1000 units tablet Take 1,000 Units by mouth daily.   colchicine 0.6 MG tablet Take 0.6 mg by mouth daily as needed (gout).   digoxin 0.125 MG tablet Commonly known as:  LANOXIN Take 0.5 tablets (0.0625 mg total) by mouth daily.   doxycycline 100 MG capsule Commonly known as:  VIBRAMYCIN   eplerenone 25 MG tablet Commonly known as:  INSPRA Take 1 tablet (25 mg total) by mouth daily.   gabapentin 300 MG capsule Commonly known as:  NEURONTIN Take 2 capsules (600 mg total) by mouth 3 (three) times daily. TAKE ONE CAPSULE BY MOUTH 5 TIMES DAILY AS NEEDED FOR LEG PAIN   Investigational - Study Medication Take 1 tablet by mouth daily. Study name: [place] Additional study details: MK-1242 (vericiguat) or Placebo   ipratropium 0.06 % nasal spray Commonly known as:  ATROVENT Place 2 sprays into both nostrils daily.   LEVEMIR 100 UNIT/ML injection Generic drug:  insulin detemir Inject 7 Units into the skin 2 (two) times  daily.   lidocaine 5 % Commonly known as:  LIDODERM Place 1 patch onto the skin daily as needed for pain.   methocarbamol 750 MG tablet Commonly known as:  ROBAXIN Take 750 mg by mouth 2 (two) times daily as needed for muscle spasms.   nitroGLYCERIN 0.4 MG SL tablet Commonly known as:  NITROSTAT Place 1 tablet (0.4 mg total) under the tongue every 5 (five) minutes as needed for chest pain (MAX 3 TABLETS).   NOVOLIN R 100 units/mL injection Generic drug:  insulin regular Inject 5 Units into the skin  daily before supper.   omeprazole 20 MG capsule Commonly known as:  PRILOSEC Take 20 mg by mouth daily as needed (acid reflux).   potassium chloride SA 20 MEQ tablet Commonly known as:  K-DUR,KLOR-CON Take 1 tablet (20 mEq total) by mouth daily.   sacubitril-valsartan 24-26 MG Commonly known as:  ENTRESTO Take 1 tablet by mouth 2 (two) times daily.   torsemide 20 MG tablet Commonly known as:  DEMADEX Take 4 tabs in AM daily and take 2 tabs every other PM   traMADol 50 MG tablet Commonly known as:  ULTRAM TAKE TWO TABLETS BY MOUTH EVERY 8 HOURS AS NEEDED   warfarin 2.5 MG tablet Commonly known as:  COUMADIN Take as directed by the anticoagulation clinic. If you are unsure how to take this medication, talk to your nurse or doctor. Original instructions:  Take 1 tablet (2.5 mg total) by mouth See admin instructions.       Allergies:  Allergies  Allergen Reactions  . Avelox [Moxifloxacin Hcl In Nacl] Swelling, Rash and Other (See Comments)    Patient became hypotensive after infusion started Because of a history of documented adverse serious drug reaction;Medi Alert bracelet  is recommended  . Penicillins Anaphylaxis, Swelling and Other (See Comments)    REACTION: anaphylaxis Because of a history of documented adverse serious drug reaction;Medi Alert bracelet  is recommended Has patient had a PCN reaction causing immediate rash, facial/tongue/throat swelling, SOB or  lightheadedness with hypotension: Yes Has patient had a PCN reaction causing severe rash involving mucus membranes or skin necrosis: No Has patient had a PCN reaction that required hospitalization: Unknown Has patient had a PCN reaction occurring within the last 10 years: No     Past Medical History:  Diagnosis Date  . AAA (abdominal aortic aneurysm) ([place])    Surgical repair 11/2002.  . Adenomatous colon polyp   . Alcohol ingestion of more than four drinks per week    Excess beer  not a dependency problem  . Aortic valve sclerosis   . Arthritis   . Atrial fibrillation ([place])    Previous long-term amiodarone therapy with multiple cardioversions / amiodarone stopped September, 2009  . Atrial flutter [facility])    Started November, 2010, Left-sided and cannot ablate  . Bony abnormality    Patient's manubrium is slightly displaced to the right  . CAD (coronary artery disease)    a. s/p CABG 2004;  b. 02/2016 Cath: LM nl, LAD 70p, 182m, D1 50, D2 50, RI 50ost, LCX 70m, OM2 100, OM3 100, RCA 70p, 100d, VG->OM3 ok, LIMA->LAD 60ost, VG->RPDA  ok.  . 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 01/2013 MDT DTBA 1D1 [place] CRT-D, ser # [id number].  . Carotid artery disease ([place])    Doppler, December, 2013, 0-39% bilateral  . Chronic systolic CHF (congestive heart failure) ([place])    a. 02/2016 Echo: EF 35-40%, diff HK, triv AI, mildly dil Ao root 73mm, mild MR, sev dil LA, triv TR.  Marland Kitchen Chronotropic incompetence    IV pacing rate adjusted  . CKD (chronic kidney disease), stage III ([place])   . COPD (chronic obstructive pulmonary disease) ([place])   . Dilated aortic root ([place])   . Discolored skin   . Diverticulosis   . Drug therapy    Redness and swelling with Avelox infusion May 24, 2011  . Eye abnormality    Ophthalmologist questions a clot in one of his eyes, May, 2012  . Gout   .  Hyperlipidemia   . Hypertension   . Internal hemorrhoids   . Ischemic cardiomyopathy   .  Left atrial thrombus    Remote past... cardioversions done since that time  . Mitral regurgitation    Mild echo  . Myocardial infarction ([place])   . Nasal drainage    Chronic  . Overweight(278.02)   . Pericardial effusion   . Pleural effusion    Large loculated effusion on the left side November, 2011. This was tapped. It was exudative. Cytology revealed no cancer no proof of mesothelioma area pulmonary team felt that no further workup was needed  . Pleural thickening   . Pneumonia   . Polycythemia vera ([place]) 07/28/2014  . SOB (shortness of breath)    Large left effusion/ thoracentesis/hospitalization/November, 2011... exudated.. cytology negative.. Dr.Wert.. no proof of mesothelioma  . Spinal stenosis    Surgery Dr.Elsner  . Thrombophlebitis of superficial veins of upper extremities    Possible venous stenosis from defibrillator  . Type II diabetes mellitus ([place]) 2008  . Venous insufficiency    Toe discoloration chronic  . Ventral hernia    April, 2014, result of his abdominal surgery  . Warfarin anticoagulation   . Wide-complex tachycardia ([place])     Past Surgical History:  Procedure Laterality Date  . ABDOMINAL AORTIC ANEURYSM REPAIR  11/2002  . ANTERIOR CERVICAL DECOMP/DISCECTOMY FUSION  1995  . BACK SURGERY    . BI-VENTRICULAR PACEMAKER INSERTION (CRT-P)  02-11-2013   Pt with previously implanted MDT CRTD downgraded to CRTP by Dr Lovena Le 02-11-13  . CARDIAC CATHETERIZATION  "several"  . CARDIAC CATHETERIZATION N/A 03/18/2016   Procedure: RIGHT/LEFT HEART CATH AND CORONARY/GRAFT ANGIOGRAPHY;  Surgeon: Nelva Bush, MD;  Location: [facility] CV LAB;  Service: Cardiovascular;  Laterality: N/A;  . CATARACT EXTRACTION W/ INTRAOCULAR LENS  IMPLANT, BILATERAL    . COLONOSCOPY W/ POLYPECTOMY    . CORONARY ANGIOPLASTY WITH STENT PLACEMENT  2004  . CORONARY ARTERY BYPASS GRAFT  2004   CABG X4  . FRACTURE SURGERY    . IMPLANTABLE CARDIOVERTER DEFIBRILLATOR (ICD) GENERATOR CHANGE  N/A 02/11/2013   Procedure: ICD GENERATOR CHANGE;  Surgeon: Evans Lance, MD;  Location: [facility] CATH LAB;  Service: Cardiovascular;  Laterality: N/A;  . INCISION AND DRAINAGE ABSCESS / HEMATOMA OF BURSA / KNEE / THIGH    . INSERT / REPLACE / REMOVE PACEMAKER  2009   original pacer/defibrillator; Dr. Lovena Le 2009... by the pacing  . LUMBAR FUSION  2010  . ORIF TIBIA & FIBULA FRACTURES Right 2000s  . PILONIDAL CYST EXCISION    . RIGHT HEART CATH N/A 10/19/2016   Procedure: Right Heart Cath;  Surgeon: [name], MD;  Location: [place] CV LAB;  Service: Cardiovascular;  Laterality: N/A;  . SPINAL CORD STIMULATOR IMPLANT  12/2011  . SURGERY SCROTAL / TESTICULAR      Family History  Problem Relation Age of Onset  . Hypertension Mother   . Stroke Mother   . Diabetes Father   . Coronary artery disease Father   . Other Father        DVT  . Diabetes Brother   . Colon cancer Neg Hx   . Heart attack Neg Hx     Social History:  reports that he quit smoking about 21 years ago. His smoking use included cigarettes. He started smoking about 60 years ago. He has a 117.00 pack-year smoking history. he has never used smokeless tobacco. He reports that he drinks about 1.2 oz  of alcohol per week. He reports that he does not use drugs.    Review of Systems       Lipids: Treated with Lipitor and is taking 10 mg, Followed by PCP and cardiologist  LDL is under 70    Lab Results  Component Value Date   CHOL 146 08/10/2016   HDL 48 08/10/2016   LDLCALC 71 08/10/2016   TRIG 137 08/10/2016   CHOLHDL 3.0 08/10/2016       The blood pressure has been under control with current regimen of Spironolactone, Carvedilol  Followed closely by cardiology  Renal dysfunction: Creatinine has stabilized  Lab Results  Component Value Date   CREATININE 2.1 (H) 01/30/2017   CREATININE 1.71 (H) 01/27/2017   CREATININE 2.21 (H) 01/11/2017   CREATININE 1.79 (H) 12/01/2016   CREATININE 1.8 (H) 11/08/2016    CREATININE 1.50 (H) 11/07/2016         Has known neuropathy: He has had Long-standing numbness, pains and tingling in his feet and lower legs.  Numbness is worse in the mornings Mostly having  Less symptoms in day Takes gabapentin for this and uses to tablet at suppertime and 1 tablet at bedtime    Physical Examination:  BP 124/80   Pulse (!) 49   Ht 6' (1.829 m)   Wt 238 lb 6.4 oz (108.1 kg)   SpO2 96%   BMI 32.33 kg/m       ASSESSMENT/PLAN:  Diabetes type 2 See history of present illness for detailed discussion of his current management, blood sugar patterns and problems identified  His blood sugars are somewhat better by recall especially fasting with increasing his Levemir However still has some increase in blood sugars after meals, reportedly about 160 average Fructosamine is relatively high compared to earlier this year Currently not covering his lunch meal with any Regular Insulin Since he did not bring his monitor cannot assess his blood sugar patterns adequately   Recommendations: When eating out at lunchtime he can take 3 units of regular insulin before eating No change in Levemir 6 units of regular insulin before suppertime instead of 5  And about 2 weeks when he switches to Novolin N he can stop the lunchtime coverage and take 8 units in the morning and 6 at bedtime Continue suppertime Regular Insulin unchanged afterwards  He will call if his blood sugars are not consistently controlled  CKD: Creatinine is variable based on his fluid status   Patient Instructions  Regular 3 units before lunch and 6 units before supper  Relion Novolin N at 9-10 am and take 8 units AND stop R at lunch   Bedtime 6 Novolin N      Patrina Andreas 02/07/2017, 1:45 PM

## 2017-02-07 NOTE — Patient Instructions (Addendum)
Regular 3 units before lunch and 6 units before supper  Relion Novolin N at 9-10 am and take 8 units AND stop R at lunch   Bedtime 6 Novolin N

## 2017-02-08 DIAGNOSIS — M86171 Other acute osteomyelitis, right ankle and foot: Secondary | ICD-10-CM | POA: Diagnosis not present

## 2017-02-13 ENCOUNTER — Telehealth (HOSPITAL_COMMUNITY): Payer: Self-pay | Admitting: *Deleted

## 2017-02-13 NOTE — Telephone Encounter (Signed)
Threshold: 15-20  Reading: 20  Instructions: per Darrick Grinder, NP pt needs to increase Torsemide to 80 mg BID for 2 days only then resume current dose.    Attempted to call pt and left mess on his home number and wife's cell number to call us back.

## 2017-02-14 NOTE — Telephone Encounter (Signed)
pts wife aware, agreeable and verbalizes understanding

## 2017-02-21 DIAGNOSIS — H353131 Nonexudative age-related macular degeneration, bilateral, early dry stage: Secondary | ICD-10-CM | POA: Diagnosis not present

## 2017-02-21 DIAGNOSIS — E113291 Type 2 diabetes mellitus with mild nonproliferative diabetic retinopathy without macular edema, right eye: Secondary | ICD-10-CM | POA: Diagnosis not present

## 2017-02-27 ENCOUNTER — Other Ambulatory Visit (HOSPITAL_COMMUNITY): Payer: Self-pay | Admitting: Cardiology

## 2017-03-01 ENCOUNTER — Ambulatory Visit (INDEPENDENT_AMBULATORY_CARE_PROVIDER_SITE_OTHER): Payer: Medicare Other | Admitting: *Deleted

## 2017-03-01 ENCOUNTER — Ambulatory Visit: Payer: Medicare Other | Admitting: Internal Medicine

## 2017-03-01 ENCOUNTER — Encounter: Payer: Self-pay | Admitting: Internal Medicine

## 2017-03-01 VITALS — BP 124/60 | HR 86 | Ht 72.0 in | Wt 237.6 lb

## 2017-03-01 DIAGNOSIS — I482 Chronic atrial fibrillation, unspecified: Secondary | ICD-10-CM

## 2017-03-01 DIAGNOSIS — I5022 Chronic systolic (congestive) heart failure: Secondary | ICD-10-CM

## 2017-03-01 DIAGNOSIS — I4891 Unspecified atrial fibrillation: Secondary | ICD-10-CM

## 2017-03-01 DIAGNOSIS — Z95810 Presence of automatic (implantable) cardiac defibrillator: Secondary | ICD-10-CM | POA: Diagnosis not present

## 2017-03-01 DIAGNOSIS — I4821 Permanent atrial fibrillation: Secondary | ICD-10-CM

## 2017-03-01 LAB — CUP PACEART INCLINIC DEVICE CHECK
Battery Remaining Longevity: 55 mo
Brady Statistic AP VS Percent: 0 %
Brady Statistic AS VP Percent: 34.41 %
Brady Statistic AS VS Percent: 65.59 %
Date Time Interrogation Session: 20181212171007
HIGH POWER IMPEDANCE MEASURED VALUE: 65 Ohm
Implantable Lead Implant Date: 20090121
Implantable Lead Implant Date: 20090121
Implantable Lead Implant Date: 20090121
Implantable Lead Location: 753858
Implantable Lead Model: 6935
Lead Channel Impedance Value: 342 Ohm
Lead Channel Impedance Value: 437 Ohm
Lead Channel Pacing Threshold Pulse Width: 0.4 ms
Lead Channel Sensing Intrinsic Amplitude: 3.75 mV
Lead Channel Sensing Intrinsic Amplitude: 3.75 mV
Lead Channel Setting Pacing Pulse Width: 0.4 ms
MDC IDC LEAD LOCATION: 753859
MDC IDC LEAD LOCATION: 753860
MDC IDC MSMT BATTERY VOLTAGE: 2.97 V
MDC IDC MSMT LEADCHNL LV IMPEDANCE VALUE: 4047 Ohm
MDC IDC MSMT LEADCHNL LV IMPEDANCE VALUE: 4047 Ohm
MDC IDC MSMT LEADCHNL RA IMPEDANCE VALUE: 494 Ohm
MDC IDC MSMT LEADCHNL RV IMPEDANCE VALUE: 437 Ohm
MDC IDC MSMT LEADCHNL RV PACING THRESHOLD AMPLITUDE: 0.75 V
MDC IDC MSMT LEADCHNL RV SENSING INTR AMPL: 9.375 mV
MDC IDC MSMT LEADCHNL RV SENSING INTR AMPL: 9.875 mV
MDC IDC PG IMPLANT DT: 20141124
MDC IDC SET LEADCHNL RV PACING AMPLITUDE: 2.5 V
MDC IDC SET LEADCHNL RV SENSING SENSITIVITY: 0.3 mV
MDC IDC STAT BRADY AP VP PERCENT: 0 %
MDC IDC STAT BRADY RA PERCENT PACED: 0 %
MDC IDC STAT BRADY RV PERCENT PACED: 50.9 %

## 2017-03-01 LAB — POCT INR: INR: 2.7

## 2017-03-01 MED ORDER — OMEPRAZOLE 20 MG PO CPDR: 40.0000 mg | DELAYED_RELEASE_CAPSULE | Freq: Two times a day (BID) | ORAL | 11 refills | Status: AC

## 2017-03-01 NOTE — Progress Notes (Signed)
HPI [name] returns today for ongoing ICD evaluation and management. He has a h/o chronic atrial fib, non-ischemic CM, and chest pain. In the interim, he has experienced chest pain with lying down. He has had to sleep in a chair. No sob. He has chronic systolic heart failure and has undergone insertion of a cardio Mems.  Allergies  Allergen Reactions  . Avelox [Moxifloxacin Hcl In Nacl] Swelling, Rash and Other (See Comments)    Patient became hypotensive after infusion started Because of a history of documented adverse serious drug reaction;Medi Alert bracelet  is recommended  . Penicillins Anaphylaxis, Swelling and Other (See Comments)    REACTION: anaphylaxis Because of a history of documented adverse serious drug reaction;Medi Alert bracelet  is recommended Has patient had a PCN reaction causing immediate rash, facial/tongue/throat swelling, SOB or lightheadedness with hypotension: Yes Has patient had a PCN reaction causing severe rash involving mucus membranes or skin necrosis: No Has patient had a PCN reaction that required hospitalization: Unknown Has patient had a PCN reaction occurring within the last 10 years: No      Current Outpatient Medications  Medication Sig Dispense Refill  . ACCU-CHEK AVIVA PLUS test strip USE 1 STRIP TO CHECK GLUCOSE THREE TIMES DAILY AS DIRECTED 100 each 3  . ACCU-CHEK FASTCLIX LANCETS MISC USE ONE LANCET TO CHECK GLUCOSE THREE TIMES DAILY 102 each 5  . acetaminophen (TYLENOL) 325 MG tablet Take 325 mg by mouth 4 (four) times daily as needed for moderate pain.     Marland Kitchen allopurinol (ZYLOPRIM) 100 MG tablet TAKE TWO TABLETS BY MOUTH ONCE DAILY 180 tablet 3  . atorvastatin (LIPITOR) 10 MG tablet Take 1 tablet (10 mg total) by mouth daily. 90 tablet 1  . carboxymethylcellulose (REFRESH PLUS) 0.5 % SOLN Place 2 drops into both eyes daily.     . carvedilol (COREG) 12.5 MG tablet Take 1 tablet (12.5 mg total) by mouth 2 (two) times daily. 60 tablet 6   . Cholecalciferol 1000 units tablet Take 1,000 Units by mouth daily.    . colchicine 0.6 MG tablet Take 0.6 mg by mouth daily as needed (gout).     Marland Kitchen digoxin (LANOXIN) 0.125 MG tablet Take 0.5 tablets (0.0625 mg total) by mouth daily. 15 tablet 6  . doxycycline (VIBRAMYCIN) 100 MG capsule     . eplerenone (INSPRA) 25 MG tablet Take 1 tablet (25 mg total) by mouth daily. 30 tablet 3  . gabapentin (NEURONTIN) 300 MG capsule Take 2 capsules (600 mg total) by mouth 3 (three) times daily. TAKE ONE CAPSULE BY MOUTH 5 TIMES DAILY AS NEEDED FOR LEG PAIN 540 capsule 3  . insulin detemir (LEVEMIR) 100 UNIT/ML injection Inject 7 Units into the skin 2 (two) times daily.     . insulin regular (NOVOLIN R) 100 units/mL injection Inject 5 Units into the skin daily before supper.     . Investigational - Study Medication Take 1 tablet by mouth daily. Study name: [place] Additional study details: MK-1242 (vericiguat) or Placebo 1 each PRN  . ipratropium (ATROVENT) 0.06 % nasal spray Place 2 sprays into both nostrils daily.     Marland Kitchen lidocaine (LIDODERM) 5 % Place 1 patch onto the skin daily as needed for pain.    . methocarbamol (ROBAXIN) 750 MG tablet Take 750 mg by mouth 2 (two) times daily as needed for muscle spasms.    . nitroGLYCERIN (NITROSTAT) 0.4 MG SL tablet Place 1 tablet (0.4 mg total) under the  tongue every 5 (five) minutes as needed for chest pain (MAX 3 TABLETS). 25 tablet 3  . potassium chloride SA (K-DUR,KLOR-CON) 20 MEQ tablet Take 1 tablet (20 mEq total) by mouth daily. 90 tablet 3  . sacubitril-valsartan (ENTRESTO) 24-26 MG Take 1 tablet by mouth 2 (two) times daily. 60 tablet 3  . torsemide (DEMADEX) 20 MG tablet Take 4 tabs in AM daily and take 2 tabs every other PM 540 tablet 3  . traMADol (ULTRAM) 50 MG tablet TAKE TWO TABLETS BY MOUTH EVERY 8 HOURS AS NEEDED 180 tablet 2  . warfarin (COUMADIN) 2.5 MG tablet Take 1 tablet (2.5 mg total) by mouth See admin instructions. 120 tablet 1  .  omeprazole (PRILOSEC) 20 MG capsule Take 2 capsules (40 mg total) by mouth 2 (two) times daily. 120 capsule 11   No current facility-administered medications for this visit.      Past Medical History:  Diagnosis Date  . AAA (abdominal aortic aneurysm) ([place])    Surgical repair 11/2002.  . Adenomatous colon polyp   . Alcohol ingestion of more than four drinks per week    Excess beer  not a dependency problem  . Aortic valve sclerosis   . Arthritis   . Atrial fibrillation ([place])    Previous long-term amiodarone therapy with multiple cardioversions / amiodarone stopped September, 2009  . Atrial flutter [facility])    Started November, 2010, Left-sided and cannot ablate  . Bony abnormality    Patient's manubrium is slightly displaced to the right  . CAD (coronary artery disease)    a. s/p CABG 2004;  b. 02/2016 Cath: LM nl, LAD 70p, 156m, D1 50, D2 50, RI 50ost, LCX 57m, OM2 100, OM3 100, RCA 70p, 100d, VG->OM3 ok, LIMA->LAD 60ost, VG->RPDA  ok.  . 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 01/2013 MDT DTBA 1D1 [place] CRT-D, ser # [id number].  . Carotid artery disease ([place])    Doppler, December, 2013, 0-39% bilateral  . Chronic systolic CHF (congestive heart failure) ([place])    a. 02/2016 Echo: EF 35-40%, diff HK, triv AI, mildly dil Ao root 95mm, mild MR, sev dil LA, triv TR.  Marland Kitchen Chronotropic incompetence    IV pacing rate adjusted  . CKD (chronic kidney disease), stage III ([place])   . COPD (chronic obstructive pulmonary disease) ([place])   . Dilated aortic root ([place])   . Discolored skin   . Diverticulosis   . Drug therapy    Redness and swelling with Avelox infusion May 24, 2011  . Eye abnormality    Ophthalmologist questions a clot in one of his eyes, May, 2012  . Gout   . Hyperlipidemia   . Hypertension   . Internal hemorrhoids   . Ischemic cardiomyopathy   . Left atrial thrombus    Remote past... cardioversions done since that time  . Mitral regurgitation     Mild echo  . Myocardial infarction ([place])   . Nasal drainage    Chronic  . Overweight(278.02)   . Pericardial effusion   . Pleural effusion    Large loculated effusion on the left side November, 2011. This was tapped. It was exudative. Cytology revealed no cancer no proof of mesothelioma area pulmonary team felt that no further workup was needed  . Pleural thickening   . Pneumonia   . Polycythemia vera ([place]) 07/28/2014  . SOB (shortness of breath)    Large left effusion/ thoracentesis/hospitalization/November, 2011... exudated.. cytology negative.. Dr.Wert.. no  proof of mesothelioma  . Spinal stenosis    Surgery Dr.Elsner  . Thrombophlebitis of superficial veins of upper extremities    Possible venous stenosis from defibrillator  . Type II diabetes mellitus ([place]) 2008  . Venous insufficiency    Toe discoloration chronic  . Ventral hernia    April, 2014, result of his abdominal surgery  . Warfarin anticoagulation   . Wide-complex tachycardia ([place])     ROS:   All systems reviewed and negative except as noted in the HPI.   Past Surgical History:  Procedure Laterality Date  . ABDOMINAL AORTIC ANEURYSM REPAIR  11/2002  . ANTERIOR CERVICAL DECOMP/DISCECTOMY FUSION  1995  . BACK SURGERY    . BI-VENTRICULAR PACEMAKER INSERTION (CRT-P)  02-11-2013   Pt with previously implanted MDT CRTD downgraded to CRTP by Dr Lovena Le 02-11-13  . CARDIAC CATHETERIZATION  "several"  . CARDIAC CATHETERIZATION N/A 03/18/2016   Procedure: RIGHT/LEFT HEART CATH AND CORONARY/GRAFT ANGIOGRAPHY;  Surgeon: Nelva Bush, MD;  Location: [place] CV LAB;  Service: Cardiovascular;  Laterality: N/A;  . CATARACT EXTRACTION W/ INTRAOCULAR LENS  IMPLANT, BILATERAL    . COLONOSCOPY W/ POLYPECTOMY    . CORONARY ANGIOPLASTY WITH STENT PLACEMENT  2004  . CORONARY ARTERY BYPASS GRAFT  2004   CABG X4  . FRACTURE SURGERY    . IMPLANTABLE CARDIOVERTER DEFIBRILLATOR (ICD) GENERATOR CHANGE N/A 02/11/2013    Procedure: ICD GENERATOR CHANGE;  Surgeon: Evans Lance, MD;  Location: [facility] CATH LAB;  Service: Cardiovascular;  Laterality: N/A;  . INCISION AND DRAINAGE ABSCESS / HEMATOMA OF BURSA / KNEE / THIGH    . INSERT / REPLACE / REMOVE PACEMAKER  2009   original pacer/defibrillator; Dr. Lovena Le 2009... by the pacing  . LUMBAR FUSION  2010  . ORIF TIBIA & FIBULA FRACTURES Right 2000s  . PILONIDAL CYST EXCISION    . RIGHT HEART CATH N/A 10/19/2016   Procedure: Right Heart Cath;  Surgeon: [name], MD;  Location: [place] CV LAB;  Service: Cardiovascular;  Laterality: N/A;  . SPINAL CORD STIMULATOR IMPLANT  12/2011  . SURGERY SCROTAL / TESTICULAR       Family History  Problem Relation Age of Onset  . Hypertension Mother   . Stroke Mother   . Diabetes Father   . Coronary artery disease Father   . Other Father        DVT  . Diabetes Brother   . Colon cancer Neg Hx   . Heart attack Neg Hx      Social History   Socioeconomic History  . Marital status: Married    Spouse name: Not on file  . Number of children: Not on file  . Years of education: Not on file  . Highest education level: Not on file  Social Needs  . Financial resource strain: Not on file  . Food insecurity - worry: Not on file  . Food insecurity - inability: Not on file  . Transportation needs - medical: Not on file  . Transportation needs - non-medical: Not on file  Occupational History  . Occupation: Retired- Nurse, mental health  Tobacco Use  . Smoking status: Former Smoker    Packs/day: 3.00    Years: 39.00    Pack years: 117.00    Types: Cigarettes    Start date: 05/24/1956    Last attempt to quit: 03/22/1995    Years since quitting: 21.9  . Smokeless tobacco: Never Used  Substance and Sexual Activity  . Alcohol use:  Yes    Alcohol/week: 1.2 oz    Types: 2 Cans of beer per week  . Drug use: No  . Sexual activity: No  Other Topics Concern  . Not on file  Social History Narrative  . Not on file      BP 124/60   Pulse 86   Ht 6' (1.829 m)   Wt 237 lb 9.6 oz (107.8 kg)   SpO2 98%   BMI 32.22 kg/m   Physical Exam:  Well appearing 76 yo man, NAD HEENT: Unremarkable Neck:  7 cm JVD, no thyromegally Lymphatics:  No adenopathy Back:  No CVA tenderness Lungs:  Clear with minimal basilar rales. HEART:  Regular rate rhythm, no murmurs, no rubs, no clicks Abd:  soft, positive bowel sounds, no organomegally, no rebound, no guarding Ext:  2 plus pulses, no edema, no cyanosis, no clubbing Skin:  No rashes no nodules Neuro:  CN II through XII intact, motor grossly intact  EKG - none  DEVICE  Normal device function.  See PaceArt for details.   Assess/Plan: 1. Chronic systolic heart failure - he is class 2. He appears to be euvolemic. He will continue his current meds. 2. Atrial fib - his ventricular rate is well controlled. He will continue his current meds. He is pacing about a third of the time.  3. ICD - his Medtronic device is working normally. Will recheck in several months. 4. Chest pain - his symptoms are exclusively when he lies down and resolve with standing which is almost certainly reflux with esophageal spasm. I have asked him to increase his prilosec to 40 mg twice daily. He does not have a pleuritic component with his pain.   Mikle Bosworth.D.

## 2017-03-01 NOTE — Patient Instructions (Addendum)
Medication Instructions:  Your physician has recommended you make the following change in your medication:  1.  Increase your prilosec to 2 tabs twice a day until your pain improves.  Labwork: None ordered.  Testing/Procedures: None ordered.  Follow-Up: Your physician wants you to follow-up in: 6 months with Dr. Lovena Le.   You will receive a reminder letter in the mail two months in advance. If you don't receive a letter, please call our office to schedule the follow-up appointment.  Remote monitoring is used to monitor your ICD from home. This monitoring reduces the number of office visits required to check your device to one time per year. It allows [place] to keep an eye on the functioning of your device to ensure it is working properly. You are scheduled for a device check from home on 05/03/2017. You may send your transmission at any time that day. If you have a wireless device, the transmission will be sent automatically. After your physician reviews your transmission, you will receive a postcard with your next transmission date.  Any Other Special Instructions Will Be Listed Below (If Applicable).   If you need a refill on your cardiac medications before your next appointment, please call your pharmacy.

## 2017-03-01 NOTE — Patient Instructions (Signed)
Continue on same dosage 1 tablet everyday except 2 tablets on Wednesdays. Recheck in 6 weeks Call with any new medications of changes  [phone] [facility].

## 2017-03-09 ENCOUNTER — Ambulatory Visit (HOSPITAL_COMMUNITY)
Admission: RE | Admit: 2017-03-09 | Discharge: 2017-03-09 | Disposition: A | Discharge status: Home / Self Care | Payer: Medicare Other | Source: Ambulatory Visit | Attending: Cardiology | Admitting: Cardiology

## 2017-03-09 DIAGNOSIS — I5022 Chronic systolic (congestive) heart failure: Secondary | ICD-10-CM

## 2017-03-09 DIAGNOSIS — Z4502 Encounter for adjustment and management of automatic implantable cardiac defibrillator: Secondary | ICD-10-CM | POA: Insufficient documentation

## 2017-03-09 DIAGNOSIS — L97511 Non-pressure chronic ulcer of other part of right foot limited to breakdown of skin: Secondary | ICD-10-CM | POA: Diagnosis not present

## 2017-03-09 NOTE — Progress Notes (Signed)
   Cardiomems Update   Implant Date 10/19/2016  RHC PCWP 11 PA Pressures 28/14 Fick CO 4.9 Fick CI 4.92    Implant Labs 10/20/2016 Creatinine 1.56 BUN 41 Potassium  4.2   PAD Range 15-20  Current PAD 19  meds  Torsemide 80 mg/40 mg    Current Labs  01/30/2017 Creatinine 2.1 BUN 43 K 4.3   I have reviewed current PAD from cardiomems device. PAD reading stable. .  Continue current diuretic regimen. He is being reviewed weekly.    Amy Clegg NP-C  4:48 PM

## 2017-03-13 ENCOUNTER — Other Ambulatory Visit (HOSPITAL_COMMUNITY): Payer: Self-pay | Admitting: Cardiology

## 2017-03-13 DIAGNOSIS — I482 Chronic atrial fibrillation, unspecified: Secondary | ICD-10-CM

## 2017-03-13 DIAGNOSIS — Z7901 Long term (current) use of anticoagulants: Secondary | ICD-10-CM

## 2017-03-15 ENCOUNTER — Other Ambulatory Visit (HOSPITAL_COMMUNITY): Payer: Self-pay | Admitting: *Deleted

## 2017-03-15 DIAGNOSIS — I482 Chronic atrial fibrillation, unspecified: Secondary | ICD-10-CM

## 2017-03-15 DIAGNOSIS — Z7901 Long term (current) use of anticoagulants: Secondary | ICD-10-CM

## 2017-03-15 MED ORDER — CARVEDILOL 12.5 MG PO TABS: 12.5000 mg | ORAL_TABLET | Freq: Two times a day (BID) | ORAL | 6 refills | Status: DC

## 2017-03-23 ENCOUNTER — Telehealth (HOSPITAL_COMMUNITY): Payer: Self-pay | Admitting: *Deleted

## 2017-03-23 NOTE — Telephone Encounter (Signed)
Threshold: 15-20  Reading: 24  Instructions: Increase Torsemide to 80 mg BID for 2 days per Darrick Grinder, NP  Attempted to call pt with directions and Left message to call back

## 2017-03-24 ENCOUNTER — Other Ambulatory Visit (HOSPITAL_COMMUNITY): Payer: Self-pay | Admitting: Pharmacist

## 2017-03-24 MED ORDER — SACUBITRIL-VALSARTAN 24-26 MG PO TABS: 1.0000 | ORAL_TABLET | Freq: Two times a day (BID) | ORAL | 11 refills | Status: DC

## 2017-03-27 ENCOUNTER — Telehealth (HOSPITAL_COMMUNITY): Payer: Self-pay | Admitting: Pharmacist

## 2017-03-27 NOTE — Telephone Encounter (Signed)
Spoke with [facility] technician who was able to run [name] through [place] for no charge to patient. Relayed info to [name] and told her that he still has $47 on PANF grant through 10/2017. In March, will hopefully be able to enroll him in 2nd grant.   Ruta Hinds. Velva Harman, PharmD, BCPS, CPP Clinical Pharmacist Pager: [phone] Phone: [phone] 03/27/2017 10:02 AM

## 2017-03-27 NOTE — Telephone Encounter (Signed)
Spoke w/pt's wife, they have been out of town, she is agreeable and will increase pt's meds, reading today was 20, 24 yesterday

## 2017-03-28 ENCOUNTER — Other Ambulatory Visit (HOSPITAL_COMMUNITY): Payer: Self-pay | Admitting: *Deleted

## 2017-03-28 ENCOUNTER — Other Ambulatory Visit (HOSPITAL_COMMUNITY): Payer: Self-pay | Admitting: Cardiology

## 2017-03-28 MED ORDER — POTASSIUM CHLORIDE CRYS ER 20 MEQ PO TBCR: 20.0000 meq | EXTENDED_RELEASE_TABLET | Freq: Every day | ORAL | 3 refills | Status: DC

## 2017-03-28 MED ORDER — TORSEMIDE 20 MG PO TABS: ORAL_TABLET | ORAL | 3 refills | Status: DC

## 2017-03-28 MED ORDER — EPLERENONE 25 MG PO TABS: 25.0000 mg | ORAL_TABLET | Freq: Every day | ORAL | 3 refills | Status: DC

## 2017-03-29 DIAGNOSIS — M2041 Other hammer toe(s) (acquired), right foot: Secondary | ICD-10-CM | POA: Diagnosis not present

## 2017-03-29 DIAGNOSIS — M86171 Other acute osteomyelitis, right ankle and foot: Secondary | ICD-10-CM | POA: Diagnosis not present

## 2017-04-03 ENCOUNTER — Telehealth (HOSPITAL_COMMUNITY): Payer: Self-pay | Admitting: *Deleted

## 2017-04-03 MED ORDER — TORSEMIDE 20 MG PO TABS: 80.0000 mg | ORAL_TABLET | Freq: Two times a day (BID) | ORAL | 3 refills | Status: DC

## 2017-04-03 NOTE — Telephone Encounter (Signed)
Threshold: 17-20  Reading: 22  Instructions: per Darrick Grinder, NP increase Torsemide to 80 mg BID for 2 days, then increase daily dose to 80 mg in AM and 40 mg in PM, labs in 1 week  Pt's wife is aware and agreeable, will check labs at [facility] 04/11/17

## 2017-04-07 ENCOUNTER — Other Ambulatory Visit (INDEPENDENT_AMBULATORY_CARE_PROVIDER_SITE_OTHER): Payer: Medicare Other

## 2017-04-07 DIAGNOSIS — E1159 Type 2 diabetes mellitus with other circulatory complications: Secondary | ICD-10-CM

## 2017-04-07 DIAGNOSIS — Z794 Long term (current) use of insulin: Secondary | ICD-10-CM | POA: Diagnosis not present

## 2017-04-07 DIAGNOSIS — E1165 Type 2 diabetes mellitus with hyperglycemia: Secondary | ICD-10-CM | POA: Diagnosis not present

## 2017-04-07 LAB — COMPREHENSIVE METABOLIC PANEL
ALT: 9 U/L (ref 0–53)
AST: 9 U/L (ref 0–37)
Albumin: 3.6 g/dL (ref 3.5–5.2)
Alkaline Phosphatase: 57 U/L (ref 39–117)
BILIRUBIN TOTAL: 0.6 mg/dL (ref 0.2–1.2)
BUN: 43 mg/dL — ABNORMAL HIGH (ref 6–23)
CALCIUM: 8.7 mg/dL (ref 8.4–10.5)
CHLORIDE: 102 meq/L (ref 96–112)
CO2: 29 meq/L (ref 19–32)
Creatinine, Ser: 1.82 mg/dL — ABNORMAL HIGH (ref 0.40–1.50)
GFR: 38.69 mL/min — AB (ref >60.00)
Glucose, Bld: 137 mg/dL — ABNORMAL HIGH (ref 70–99)
Potassium: 4.2 mEq/L (ref 3.5–5.1)
Sodium: 139 mEq/L (ref 135–145)
Total Protein: 5.9 g/dL — ABNORMAL LOW (ref 6.0–8.3)

## 2017-04-10 ENCOUNTER — Other Ambulatory Visit: Payer: Self-pay | Admitting: Endocrinology

## 2017-04-10 DIAGNOSIS — Z794 Long term (current) use of insulin: Principal | ICD-10-CM

## 2017-04-10 DIAGNOSIS — E1165 Type 2 diabetes mellitus with hyperglycemia: Secondary | ICD-10-CM

## 2017-04-10 DIAGNOSIS — E7800 Pure hypercholesterolemia, unspecified: Secondary | ICD-10-CM

## 2017-04-11 ENCOUNTER — Encounter (HOSPITAL_COMMUNITY): Payer: Self-pay | Admitting: Cardiology

## 2017-04-11 ENCOUNTER — Ambulatory Visit (HOSPITAL_COMMUNITY)
Admission: RE | Admit: 2017-04-11 | Discharge: 2017-04-11 | Disposition: A | Discharge status: Home / Self Care | Payer: Medicare Other | Source: Ambulatory Visit | Attending: Cardiology | Admitting: Cardiology

## 2017-04-11 ENCOUNTER — Other Ambulatory Visit: Payer: Self-pay | Admitting: Internal Medicine

## 2017-04-11 ENCOUNTER — Other Ambulatory Visit: Payer: Self-pay

## 2017-04-11 VITALS — BP 132/70 | HR 62 | Wt 242.0 lb

## 2017-04-11 DIAGNOSIS — Z951 Presence of aortocoronary bypass graft: Secondary | ICD-10-CM | POA: Diagnosis not present

## 2017-04-11 DIAGNOSIS — N183 Chronic kidney disease, stage 3 (moderate): Secondary | ICD-10-CM | POA: Insufficient documentation

## 2017-04-11 DIAGNOSIS — I482 Chronic atrial fibrillation, unspecified: Secondary | ICD-10-CM

## 2017-04-11 DIAGNOSIS — Z79899 Other long term (current) drug therapy: Secondary | ICD-10-CM | POA: Diagnosis not present

## 2017-04-11 DIAGNOSIS — Z8679 Personal history of other diseases of the circulatory system: Secondary | ICD-10-CM | POA: Insufficient documentation

## 2017-04-11 DIAGNOSIS — E785 Hyperlipidemia, unspecified: Secondary | ICD-10-CM | POA: Diagnosis not present

## 2017-04-11 DIAGNOSIS — M109 Gout, unspecified: Secondary | ICD-10-CM | POA: Insufficient documentation

## 2017-04-11 DIAGNOSIS — I255 Ischemic cardiomyopathy: Secondary | ICD-10-CM | POA: Insufficient documentation

## 2017-04-11 DIAGNOSIS — E1159 Type 2 diabetes mellitus with other circulatory complications: Secondary | ICD-10-CM | POA: Diagnosis not present

## 2017-04-11 DIAGNOSIS — E1122 Type 2 diabetes mellitus with diabetic chronic kidney disease: Secondary | ICD-10-CM | POA: Insufficient documentation

## 2017-04-11 DIAGNOSIS — Z87891 Personal history of nicotine dependence: Secondary | ICD-10-CM | POA: Insufficient documentation

## 2017-04-11 DIAGNOSIS — R42 Dizziness and giddiness: Secondary | ICD-10-CM | POA: Insufficient documentation

## 2017-04-11 DIAGNOSIS — D45 Polycythemia vera: Secondary | ICD-10-CM | POA: Insufficient documentation

## 2017-04-11 DIAGNOSIS — Z794 Long term (current) use of insulin: Secondary | ICD-10-CM | POA: Insufficient documentation

## 2017-04-11 DIAGNOSIS — I2582 Chronic total occlusion of coronary artery: Secondary | ICD-10-CM | POA: Insufficient documentation

## 2017-04-11 DIAGNOSIS — I712 Thoracic aortic aneurysm, without rupture: Secondary | ICD-10-CM | POA: Insufficient documentation

## 2017-04-11 DIAGNOSIS — I5022 Chronic systolic (congestive) heart failure: Secondary | ICD-10-CM | POA: Diagnosis not present

## 2017-04-11 DIAGNOSIS — Z7901 Long term (current) use of anticoagulants: Secondary | ICD-10-CM | POA: Insufficient documentation

## 2017-04-11 DIAGNOSIS — I2510 Atherosclerotic heart disease of native coronary artery without angina pectoris: Secondary | ICD-10-CM | POA: Insufficient documentation

## 2017-04-11 LAB — DIGOXIN LEVEL: Digoxin Level: <0.2 ng/mL — ABNORMAL LOW (ref 0.8–2.0)

## 2017-04-11 LAB — BASIC METABOLIC PANEL
ANION GAP: 13 (ref 5–15)
BUN: 33 mg/dL — ABNORMAL HIGH (ref 6–20)
CO2: 25 mmol/L (ref 22–32)
CREATININE: 1.61 mg/dL — AB (ref 0.61–1.24)
Calcium: 8.7 mg/dL — ABNORMAL LOW (ref 8.9–10.3)
Chloride: 102 mmol/L (ref 101–111)
GFR calc non Af Amer: 40 mL/min — ABNORMAL LOW (ref >60)
GFR, EST AFRICAN AMERICAN: 47 mL/min — AB (ref >60)
GLUCOSE: 148 mg/dL — AB (ref 65–99)
Potassium: 4 mmol/L (ref 3.5–5.1)
Sodium: 140 mmol/L (ref 135–145)

## 2017-04-11 MED ORDER — CARVEDILOL 12.5 MG PO TABS: 18.7500 mg | ORAL_TABLET | Freq: Two times a day (BID) | ORAL | 6 refills | Status: DC

## 2017-04-11 NOTE — Addendum Note (Signed)
Addended by: Kaylyn Lim I on: 04/11/2017 08:15 AM   Modules accepted: Orders

## 2017-04-11 NOTE — Patient Instructions (Signed)
Stop Eplerenone   Increase Carvedilol 18.75 mg (1.5 tab), twice a day  Labs drawn today (if we do not call you, then your lab work was stable)   Your physician has requested that you have an echocardiogram. Echocardiography is a painless test that uses sound waves to create images of your heart. It provides your doctor with information about the size and shape of your heart and how well your heart's chambers and valves are working. This procedure takes approximately one hour. There are no restrictions for this procedure.  Your physician recommends that you schedule a follow-up appointment in: 3 months with Dr. Aundra Dubin

## 2017-04-12 ENCOUNTER — Encounter: Payer: Self-pay | Admitting: Endocrinology

## 2017-04-12 ENCOUNTER — Other Ambulatory Visit (HOSPITAL_COMMUNITY): Payer: Self-pay

## 2017-04-12 ENCOUNTER — Ambulatory Visit (INDEPENDENT_AMBULATORY_CARE_PROVIDER_SITE_OTHER): Payer: Medicare Other | Admitting: Pharmacist

## 2017-04-12 ENCOUNTER — Ambulatory Visit: Payer: Medicare Other | Admitting: Endocrinology

## 2017-04-12 ENCOUNTER — Encounter (HOSPITAL_COMMUNITY): Payer: Self-pay

## 2017-04-12 VITALS — BP 120/60 | HR 60 | Ht 72.0 in | Wt 243.0 lb

## 2017-04-12 DIAGNOSIS — Z794 Long term (current) use of insulin: Secondary | ICD-10-CM

## 2017-04-12 DIAGNOSIS — E1142 Type 2 diabetes mellitus with diabetic polyneuropathy: Secondary | ICD-10-CM

## 2017-04-12 DIAGNOSIS — I4891 Unspecified atrial fibrillation: Secondary | ICD-10-CM | POA: Diagnosis not present

## 2017-04-12 DIAGNOSIS — E1165 Type 2 diabetes mellitus with hyperglycemia: Secondary | ICD-10-CM | POA: Diagnosis not present

## 2017-04-12 DIAGNOSIS — I482 Chronic atrial fibrillation: Secondary | ICD-10-CM

## 2017-04-12 DIAGNOSIS — I4821 Permanent atrial fibrillation: Secondary | ICD-10-CM

## 2017-04-12 LAB — POCT INR: INR: 2.1

## 2017-04-12 LAB — HEMOGLOBIN A1C
Est. average glucose Bld gHb Est-mCnc: 148 mg/dL
Hgb A1c MFr Bld: 6.8 % — ABNORMAL HIGH (ref 4.8–5.6)

## 2017-04-12 NOTE — Patient Instructions (Signed)
Description   Continue on same dosage 1 tablet everyday except 2 tablets on Wednesdays. Your last dose of Coumadin will be on Wednesday, January 30th before your spinal ablation on February 5th. Resume your Coumadin on Feb 5th in the evening after your procedure. Recheck your INR on Feb 11th. Call with any new medications of changes  [phone] [facility].

## 2017-04-12 NOTE — Progress Notes (Signed)
PCP: Dr. Quay Burow Cardiologist: Dr Aundra Dubin   HPI: [name] is a 77 year old with history of CAD s/p CABG, permanent atrial fib, S/P AAA repair, ischemic cardiomyopathy/chronic systolic heart failure,  Medtronic CRT-D system.   Admitted 4/14 through 07/06/16 with volume overload. Diuresed with IV lasix and transitioned to torsemide 60 mg daily. HF meds adjusted and he was started on Entresto. Discharge weight was 220 pounds.  He now has a Cardiomems device. Torsemide has been increased to 80 qam/40 qpm based on Cardiomems evaluation.   He returns for followup of CHF and CAD.  He remains in atrial fibrillation (permanent).  We have been following his Cardiomems, goal PADP 15-20 (20 today).  He is currently taking torsemide 80 qam/40 qpm.  Breathing stable, no dyspnea walking on flat ground or up a flight of steps.  No orthopnea/PND.  No chest pain.  Pain management wants to do nerve RFA on his back, will need to hold warfarin. He will be having a phlebotomy for PV soon. He is now on eplerenone but still having breast tenderness/soreness.   PADP 20 mmHg by CardioMems    Labs (5/18): K 4.7, creatinine 1.7, hgb 15.8, BNP 1001, LDL 71 Labs (8/18): K 4.3, creatinine 1.56, hgb 14.3 Labs (9/18): K 4, creatinine 1.79, digoxin 0.8 Labs (10/18): digoxin 0.3 Labs (11/18): K 4, creatinine 1.71  PMH: 1. CAD: s/p CABG.  - LHC (12/17): totally occluded distal RCA, totally occluded mid LAD, totally occluded OM2 and OM3.  LIMA-LAD patent with 60% ostial stenosis.  SVG-OM3 and SVG-PDA were patent. Medical management.  2. Chronic systolic CHF: Ischemic cardiomyopathy.  He has a Medtronic CRT-D device.  - Echo (12/17) with EF 35-40%, diffuse hypokinesis, normal RV size and systolic function, mild MR.  - CardioMems placed.  - Painful gynecomastia with spironolactone.  3. AAA: s/p repair.  4. Atrial fibrillation: Permanent.  5. Gout 6. PAD: Saw Dr Scot Dock with [facility] in 4/18.  Noninvasive study concerning for  infra-inguinal arterial occlusive disease bilaterally but appeared to have adequate circulation to heal toe ulcer.  7. CKD stage 3.  8. Ascending aortic aneurysm: CT chest 3/18 ascending aorta 4.9 cm, arch 4.4 cm.  9. Spinal stenosis 10. Polycythemia vera 11. Hyperlipidemia 12. Type II diabetes    ROS: All systems negative except as listed in HPI, PMH and Problem List.  Social History   Socioeconomic History  . Marital status: Married    Spouse name: Not on file  . Number of children: Not on file  . Years of education: Not on file  . Highest education level: Not on file  Social Needs  . Financial resource strain: Not on file  . Food insecurity - worry: Not on file  . Food insecurity - inability: Not on file  . Transportation needs - medical: Not on file  . Transportation needs - non-medical: Not on file  Occupational History  . Occupation: Retired- Nurse, mental health  Tobacco Use  . Smoking status: Former Smoker    Packs/day: 3.00    Years: 39.00    Pack years: 117.00    Types: Cigarettes    Start date: 05/24/1956    Last attempt to quit: 03/22/1995    Years since quitting: 22.0  . Smokeless tobacco: Never Used  Substance and Sexual Activity  . Alcohol use: Yes    Alcohol/week: 1.2 oz    Types: 2 Cans of beer per week  . Drug use: No  . Sexual activity: No  Other Topics  Concern  . Not on file  Social History Narrative  . Not on file    Family History  Problem Relation Age of Onset  . Hypertension Mother   . Stroke Mother   . Diabetes Father   . Coronary artery disease Father   . Other Father        DVT  . Diabetes Brother   . Colon cancer Neg Hx   . Heart attack Neg Hx      Current Outpatient Medications  Medication Sig Dispense Refill  . ACCU-CHEK AVIVA PLUS test strip USE 1 STRIP TO CHECK GLUCOSE THREE TIMES DAILY AS DIRECTED 100 each 3  . ACCU-CHEK FASTCLIX LANCETS MISC USE ONE LANCET TO CHECK GLUCOSE THREE TIMES DAILY 102 each 5  . acetaminophen  (TYLENOL) 325 MG tablet Take 325 mg by mouth 4 (four) times daily as needed for moderate pain.     Marland Kitchen allopurinol (ZYLOPRIM) 100 MG tablet TAKE TWO TABLETS BY MOUTH ONCE DAILY 180 tablet 3  . atorvastatin (LIPITOR) 10 MG tablet Take 1 tablet (10 mg total) by mouth daily. 90 tablet 1  . carboxymethylcellulose (REFRESH PLUS) 0.5 % SOLN Place 2 drops into both eyes daily.     . carvedilol (COREG) 12.5 MG tablet Take 1.5 tablets (18.75 mg total) by mouth 2 (two) times daily with a meal. 90 tablet 6  . Cholecalciferol 1000 units tablet Take 1,000 Units by mouth daily.    . colchicine 0.6 MG tablet Take 0.6 mg by mouth daily as needed (gout).     Marland Kitchen digoxin (LANOXIN) 0.125 MG tablet Take 0.5 tablets (0.0625 mg total) by mouth daily. 15 tablet 6  . gabapentin (NEURONTIN) 300 MG capsule Take 2 capsules (600 mg total) by mouth 3 (three) times daily. TAKE ONE CAPSULE BY MOUTH 5 TIMES DAILY AS NEEDED FOR LEG PAIN 540 capsule 3  . insulin regular (NOVOLIN R) 100 units/mL injection Inject 6 Units into the skin daily before supper.     . Investigational - Study Medication Take 1 tablet by mouth daily. Study name: [place] Additional study details: MK-1242 (vericiguat) or Placebo 1 each PRN  . ipratropium (ATROVENT) 0.06 % nasal spray Place 2 sprays into both nostrils daily.     Marland Kitchen lidocaine (LIDODERM) 5 % Place 1 patch onto the skin daily as needed for pain.    . methocarbamol (ROBAXIN) 750 MG tablet Take 750 mg by mouth 2 (two) times daily as needed for muscle spasms.    . nitroGLYCERIN (NITROSTAT) 0.4 MG SL tablet Place 1 tablet (0.4 mg total) under the tongue every 5 (five) minutes as needed for chest pain (MAX 3 TABLETS). 25 tablet 3  . omeprazole (PRILOSEC) 20 MG capsule Take 2 capsules (40 mg total) by mouth 2 (two) times daily. 120 capsule 11  . potassium chloride SA (K-DUR,KLOR-CON) 20 MEQ tablet Take 1 tablet (20 mEq total) by mouth daily. 90 tablet 3  . sacubitril-valsartan (ENTRESTO) 24-26 MG Take 1  tablet by mouth 2 (two) times daily. 60 tablet 11  . torsemide (DEMADEX) 20 MG tablet Take 4 tablets in the AM and 2 tablets in the PM daily.    . traMADol (ULTRAM) 50 MG tablet TAKE TWO TABLETS BY MOUTH EVERY 8 HOURS AS NEEDED 180 tablet 2  . warfarin (COUMADIN) 2.5 MG tablet TAKE AS DIRECTED BY ADMIN INSTRUCTIONS 120 tablet 1  . insulin NPH Human (NOVOLIN N) 100 UNIT/ML injection Inject into the skin. Inject 8 units in the am and 6  units in the pm     No current facility-administered medications for this encounter.     Vitals:   04/11/17 1333  BP: 132/70  Pulse: 62  SpO2: 99%  Weight: 242 lb (109.8 kg)    PHYSICAL EXAM: General: NAD Neck: No JVD, no thyromegaly or thyroid nodule.  Lungs: Clear to auscultation bilaterally with normal respiratory effort. CV: Nondisplaced PMI.  Heart regular S1/S2, no S3/S4, no murmur.  1+ edema 1/2 up lower legs bilaterally.  No carotid bruit.  Normal pedal pulses.  Abdomen: Soft, nontender, no hepatosplenomegaly, no distention.  Skin: Intact without lesions or rashes. Tenderness over nipple area.  Neurologic: Alert and oriented x 3.  Psych: Normal affect. Extremities: No clubbing or cyanosis.  HEENT: Normal.   ASSESSMENT & PLAN: 1. Chronic Systolic Heart Failure: Ischemic cardiomyopathy. Echo 12/17 with EF 35-40%. Medtronic CRT-D.  NYHA class II.   Exam and CardioMems both suggest stable volume.  - Goal PADP for CardioMems will be 15-20 => 20 today.  - Continue torsemide 80 qam/40 qpm, BMET today.  - Continue digoxin, check level today.  - Continue current Entresto, unable to increase due to significant lightheadedness on higher dose.  - Ongoing breast tenderness at nipples even after switching from spironolactone to eplerenone.  Bilateral symptoms.  Will stop eplerenone.  If symptoms continue off eplerenone, will need to screen with mammogram.  - Increase Coreg to 18.75 mg bid.  2. Atrial fibrillation: Permanent.  Rate-controlled.  Continue  warfarin.   3. CAD: s/p CABG.  No chest pain.  - No ASA given warfarin use.  - Continue statin.  Good lipids 5/18.   4. CKD Stage III: BMET today.  5. Gout: stable. Continue allopurinol.  6. Ascending aortic aneurysm: 3/18 CTA 4.9 cm ascending aorta.  Repeat CTA in 3/19, he will follow with Dr. Cyndia Bent for this.   Followup in 3 months.   Loralie Champagne  04/12/2017

## 2017-04-12 NOTE — Progress Notes (Signed)
Patient ID: [name], male   DOB: 02-16-1941, 77 y.o.   MRN: [id number]    Reason for Appointment : Follow-up for Type 2 Diabetes  History of Present Illness          Diagnosis: Type 2 diabetes mellitus, date of diagnosis: 2008       Past history: He was initially treated with Metformin and also given Amayl at some point. With this he had fair control of his diabetes with A1c had usually been over 7% except once in 2013.  He was taken off metformin probably in March this year after a hospitalization, possibly because of renal dysfunction He was started on Levemir insulin in May when his blood sugars were significantly higher, fasting readings averaging 220.  Levemir progressively increased and was on 35 units at night since 12/20/12 He has not been on any other medications either orally or injectable for his diabetes Because of his A1c of over 10% in 10/14 on basal insulin alone he was started on NovoLog with each meal in 10/14  Recent history:     INSULIN regimen is described as: Novolin N 8 units in the morning and 6 at bedtime, Regular Insulin 6 units at supper  A1c is now 6.8 compared to 7.5 in September     Current management, blood sugar patterns and problems identified:  He was switched from Levemir to Novolin N after his visit in November because of cost  He did not report any abnormally high readings and continuing the same recommended dose of 8 units in the morning and 6 at night  He is very compliant with taking his insulin doses even with the syringe  Also trying to take mealtime insulin 15-30 minutes before eating usually  Although he is having some fluctuation in his blood sugars after lunch he is generally having fairly stable readings  FASTING blood sugars are reasonably good and averaging just over 130  No hypoglycemia at any time   Again eating only 2 meals a day  Occasionally at times he will have a larger meal with fried food and not  always eating additional insulin for this  He has gained weight from not watching his diet over the last couple of months  Compliance with the medical regimen: Good   Glucose monitoring:  done 1 Up to 3  times a day        Glucometer:  Accu-Chek    Blood Glucose readings from download:  Mean values apply above for all meters except median for One Touch  PRE-MEAL Fasting Lunch Dinner Bedtime Overall  Glucose range: 115-150       Mean/median: 132     141    POST-MEAL PC Breakfast PC Lunch PC Dinner  Glucose range:  108-234  96-166   Mean/median:  155   135     Self-care: The diet that the patient has been following is: Smaller portions and relatively balanced meals  Meals: 2 meals per day. Noon and 6 pm   Low fat meals At dinnertime. Avoiding drinks with sugar and no juices        Physical activity: exercise: Minimal, limited by back pain              Dietician visit: Most recent: Unknown.  CDE visit: 11/14                Weight control:    Wt Readings from Last 3 Encounters:  04/12/17 243 lb (110.2  kg)  04/11/17 242 lb (109.8 kg)  03/01/17 237 lb 9.6 oz (107.8 kg)    Lab Results  Component Value Date   HGBA1C 6.8 (H) 04/11/2017   HGBA1C 7.5 12/08/2016   HGBA1C 6.8 (H) 07/02/2016   Lab Results  Component Value Date   MICROALBUR 3.3 (H) 09/02/2016   LDLCALC 71 08/10/2016   CREATININE 1.61 (H) 04/11/2017    Other active problems: See review of systems   Allergies as of 04/12/2017      Reactions   Avelox [moxifloxacin Hcl In Nacl] Swelling, Rash, Other (See Comments)   Patient became hypotensive after infusion started Because of a history of documented adverse serious drug reaction;Medi Alert bracelet  is recommended   Penicillins Anaphylaxis, Swelling, Other (See Comments)   REACTION: anaphylaxis Because of a history of documented adverse serious drug reaction;Medi Alert bracelet  is recommended Has patient had a PCN reaction causing immediate rash,  facial/tongue/throat swelling, SOB or lightheadedness with hypotension: Yes Has patient had a PCN reaction causing severe rash involving mucus membranes or skin necrosis: No Has patient had a PCN reaction that required hospitalization: Unknown Has patient had a PCN reaction occurring within the last 10 years: No      Medication List        Accurate as of 04/12/17  3:16 PM. Always use your most recent med list.          ACCU-CHEK AVIVA PLUS test strip Generic drug:  glucose blood USE 1 STRIP TO CHECK GLUCOSE THREE TIMES DAILY AS DIRECTED   ACCU-CHEK FASTCLIX LANCETS Misc USE ONE LANCET TO CHECK GLUCOSE THREE TIMES DAILY   acetaminophen 325 MG tablet Commonly known as:  TYLENOL Take 325 mg by mouth 4 (four) times daily as needed for moderate pain.   allopurinol 100 MG tablet Commonly known as:  ZYLOPRIM TAKE TWO TABLETS BY MOUTH ONCE DAILY   atorvastatin 10 MG tablet Commonly known as:  LIPITOR Take 1 tablet (10 mg total) by mouth daily.   carboxymethylcellulose 0.5 % Soln Commonly known as:  REFRESH PLUS Place 2 drops into both eyes daily.   carvedilol 12.5 MG tablet Commonly known as:  COREG Take 1.5 tablets (18.75 mg total) by mouth 2 (two) times daily with a meal.   Cholecalciferol 1000 units tablet Take 1,000 Units by mouth daily.   colchicine 0.6 MG tablet Take 0.6 mg by mouth daily as needed (gout).   digoxin 0.125 MG tablet Commonly known as:  LANOXIN Take 0.5 tablets (0.0625 mg total) by mouth daily.   gabapentin 300 MG capsule Commonly known as:  NEURONTIN Take 2 capsules (600 mg total) by mouth 3 (three) times daily. TAKE ONE CAPSULE BY MOUTH 5 TIMES DAILY AS NEEDED FOR LEG PAIN   Investigational - Study Medication Take 1 tablet by mouth daily. Study name: [place] Additional study details: MK-1242 (vericiguat) or Placebo   ipratropium 0.06 % nasal spray Commonly known as:  ATROVENT Place 2 sprays into both nostrils daily.   lidocaine 5  % Commonly known as:  LIDODERM Place 1 patch onto the skin daily as needed for pain.   methocarbamol 750 MG tablet Commonly known as:  ROBAXIN Take 750 mg by mouth 2 (two) times daily as needed for muscle spasms.   nitroGLYCERIN 0.4 MG SL tablet Commonly known as:  NITROSTAT Place 1 tablet (0.4 mg total) under the tongue every 5 (five) minutes as needed for chest pain (MAX 3 TABLETS).   NOVOLIN N 100 UNIT/ML injection Generic  drug:  insulin NPH Human Inject into the skin. Inject 8 units in the am and 6 units in the pm   NOVOLIN R 100 units/mL injection Generic drug:  insulin regular Inject 6 Units into the skin daily before supper.   omeprazole 20 MG capsule Commonly known as:  PRILOSEC Take 2 capsules (40 mg total) by mouth 2 (two) times daily.   potassium chloride SA 20 MEQ tablet Commonly known as:  K-DUR,KLOR-CON Take 1 tablet (20 mEq total) by mouth daily.   sacubitril-valsartan 24-26 MG Commonly known as:  ENTRESTO Take 1 tablet by mouth 2 (two) times daily.   torsemide 20 MG tablet Commonly known as:  DEMADEX Take 4 tablets in the AM and 2 tablets in the PM daily.   traMADol 50 MG tablet Commonly known as:  ULTRAM TAKE TWO TABLETS BY MOUTH EVERY 8 HOURS AS NEEDED   warfarin 2.5 MG tablet Commonly known as:  COUMADIN Take as directed by the anticoagulation clinic. If you are unsure how to take this medication, talk to your nurse or doctor. Original instructions:  TAKE AS DIRECTED BY ADMIN INSTRUCTIONS       Allergies:  Allergies  Allergen Reactions  . Avelox [Moxifloxacin Hcl In Nacl] Swelling, Rash and Other (See Comments)    Patient became hypotensive after infusion started Because of a history of documented adverse serious drug reaction;Medi Alert bracelet  is recommended  . Penicillins Anaphylaxis, Swelling and Other (See Comments)    REACTION: anaphylaxis Because of a history of documented adverse serious drug reaction;Medi Alert bracelet  is  recommended Has patient had a PCN reaction causing immediate rash, facial/tongue/throat swelling, SOB or lightheadedness with hypotension: Yes Has patient had a PCN reaction causing severe rash involving mucus membranes or skin necrosis: No Has patient had a PCN reaction that required hospitalization: Unknown Has patient had a PCN reaction occurring within the last 10 years: No     Past Medical History:  Diagnosis Date  . AAA (abdominal aortic aneurysm) ([place])    Surgical repair 11/2002.  . Adenomatous colon polyp   . Alcohol ingestion of more than four drinks per week    Excess beer  not a dependency problem  . Aortic valve sclerosis   . Arthritis   . Atrial fibrillation ([place])    Previous long-term amiodarone therapy with multiple cardioversions / amiodarone stopped September, 2009  . Atrial flutter [facility])    Started November, 2010, Left-sided and cannot ablate  . Bony abnormality    Patient's manubrium is slightly displaced to the right  . CAD (coronary artery disease)    a. s/p CABG 2004;  b. 02/2016 Cath: LM nl, LAD 70p, 176m, D1 50, D2 50, RI 50ost, LCX 63m, OM2 100, OM3 100, RCA 70p, 100d, VG->OM3 ok, LIMA->LAD 60ost, VG->RPDA  ok.  . 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 01/2013 MDT DTBA 1D1 [place] CRT-D, ser # [id number] H.  . Carotid artery disease ([place])    Doppler, December, 2013, 0-39% bilateral  . Chronic systolic CHF (congestive heart failure) ([place])    a. 02/2016 Echo: EF 35-40%, diff HK, triv AI, mildly dil Ao root 21mm, mild MR, sev dil LA, triv TR.  Marland Kitchen Chronotropic incompetence    IV pacing rate adjusted  . CKD (chronic kidney disease), stage III ([place])   . COPD (chronic obstructive pulmonary disease) ([place])   . Dilated aortic root ([place])   . Discolored skin   . Diverticulosis   .  Drug therapy    Redness and swelling with Avelox infusion May 24, 2011  . Eye abnormality    Ophthalmologist questions a clot in one of his eyes, May, 2012    . Gout   . Hyperlipidemia   . Hypertension   . Internal hemorrhoids   . Ischemic cardiomyopathy   . Left atrial thrombus    Remote past... cardioversions done since that time  . Mitral regurgitation    Mild echo  . Myocardial infarction ([place])   . Nasal drainage    Chronic  . Overweight(278.02)   . Pericardial effusion   . Pleural effusion    Large loculated effusion on the left side November, 2011. This was tapped. It was exudative. Cytology revealed no cancer no proof of mesothelioma area pulmonary team felt that no further workup was needed  . Pleural thickening   . Pneumonia   . Polycythemia vera ([place]) 07/28/2014  . SOB (shortness of breath)    Large left effusion/ thoracentesis/hospitalization/November, 2011... exudated.. cytology negative.. Dr.Wert.. no proof of mesothelioma  . Spinal stenosis    Surgery Dr.Elsner  . Thrombophlebitis of superficial veins of upper extremities    Possible venous stenosis from defibrillator  . Type II diabetes mellitus ([place]) 2008  . Venous insufficiency    Toe discoloration chronic  . Ventral hernia    April, 2014, result of his abdominal surgery  . Warfarin anticoagulation   . Wide-complex tachycardia ([place])     Past Surgical History:  Procedure Laterality Date  . ABDOMINAL AORTIC ANEURYSM REPAIR  11/2002  . ANTERIOR CERVICAL DECOMP/DISCECTOMY FUSION  1995  . BACK SURGERY    . BI-VENTRICULAR PACEMAKER INSERTION (CRT-P)  02-11-2013   Pt with previously implanted MDT CRTD downgraded to CRTP by Dr Lovena Le 02-11-13  . CARDIAC CATHETERIZATION  "several"  . CARDIAC CATHETERIZATION N/A 03/18/2016   Procedure: RIGHT/LEFT HEART CATH AND CORONARY/GRAFT ANGIOGRAPHY;  Surgeon: Nelva Bush, MD;  Location: [facility] [facility] CV LAB;  Service: Cardiovascular;  Laterality: N/A;  . CATARACT EXTRACTION W/ INTRAOCULAR LENS  IMPLANT, BILATERAL    . COLONOSCOPY W/ POLYPECTOMY    . CORONARY ANGIOPLASTY WITH STENT PLACEMENT  2004  . CORONARY ARTERY BYPASS GRAFT   2004   CABG X4  . FRACTURE SURGERY    . IMPLANTABLE CARDIOVERTER DEFIBRILLATOR (ICD) GENERATOR CHANGE N/A 02/11/2013   Procedure: ICD GENERATOR CHANGE;  Surgeon: Evans Lance, MD;  Location: [facility] CATH LAB;  Service: Cardiovascular;  Laterality: N/A;  . INCISION AND DRAINAGE ABSCESS / HEMATOMA OF BURSA / KNEE / THIGH    . INSERT / REPLACE / REMOVE PACEMAKER  2009   original pacer/defibrillator; Dr. Lovena Le 2009... by the pacing  . LUMBAR FUSION  2010  . ORIF TIBIA & FIBULA FRACTURES Right 2000s  . PILONIDAL CYST EXCISION    . RIGHT HEART CATH N/A 10/19/2016   Procedure: Right Heart Cath;  Surgeon: [name], MD;  Location: [place] CV LAB;  Service: Cardiovascular;  Laterality: N/A;  . SPINAL CORD STIMULATOR IMPLANT  12/2011  . SURGERY SCROTAL / TESTICULAR      Family History  Problem Relation Age of Onset  . Hypertension Mother   . Stroke Mother   . Diabetes Father   . Coronary artery disease Father   . Other Father        DVT  . Diabetes Brother   . Colon cancer Neg Hx   . Heart attack Neg Hx     Social History:  reports that he  quit smoking about 22 years ago. His smoking use included cigarettes. He started smoking about 60 years ago. He has a 117.00 pack-year smoking history. he has never used smokeless tobacco. He reports that he drinks about 1.2 oz of alcohol per week. He reports that he does not use drugs.    Review of Systems       Lipids: Treated with Lipitor and is taking 10 mg, Followed by PCP and cardiologist  LDL is under 70    Lab Results  Component Value Date   CHOL 146 08/10/2016   HDL 48 08/10/2016   LDLCALC 71 08/10/2016   TRIG 137 08/10/2016   CHOLHDL 3.0 08/10/2016       The blood pressure has been under control with current regimen of Spironolactone, Carvedilol  Followed closely by cardiology  Renal dysfunction: Creatinine has recently improved  Lab Results  Component Value Date   CREATININE 1.61 (H) 04/11/2017   CREATININE 1.82 (H)  04/07/2017   CREATININE 2.1 (H) 01/30/2017   CREATININE 1.71 (H) 01/27/2017   CREATININE 2.21 (H) 01/11/2017   CREATININE 1.79 (H) 12/01/2016         Has known neuropathy: He has had Long-standing numbness, pains and tingling in his feet and lower legs.  Numbness is worse in the mornings and has control of symptoms overnight otherwise  Takes gabapentin for this and uses 2 tablet at lunch, 2 tablets at suppertime and 1 tablet at bedtime   Physical Examination:  BP 120/60   Pulse 60   Ht 6' (1.829 m)   Wt 243 lb (110.2 kg)   SpO2 94%   BMI 32.96 kg/m       ASSESSMENT/PLAN:  Diabetes type 2 on insulin See history of present illness for detailed discussion of his current management, blood sugar patterns and problems identified  His A1c is surprisingly better at 6.8 Still using relatively small doses of insulin Despite using NPH insulin his blood sugars are not fluctuating much and fairly good in the morning Has mild fluctuation and postprandial readings Overall compliance is excellent with insulin, monitoring but recently has not done as well on his diet and has gained weight  Will not change his regimen as yet Discussed timing of glucose monitoring and adjusting the dose especially when eating out can take 2-4 units more  NEUROPATHY: Stable symptoms and continue gabapentin  There are no Patient Instructions on file for this visit.   Elayne Snare 04/12/2017, 3:16 PM

## 2017-04-17 ENCOUNTER — Ambulatory Visit (HOSPITAL_COMMUNITY)
Admission: RE | Admit: 2017-04-17 | Discharge: 2017-04-17 | Disposition: A | Discharge status: Home / Self Care | Payer: Medicare Other | Source: Ambulatory Visit | Attending: Internal Medicine | Admitting: Internal Medicine

## 2017-04-17 ENCOUNTER — Telehealth (HOSPITAL_COMMUNITY): Payer: Self-pay | Admitting: Adult Health

## 2017-04-17 DIAGNOSIS — I5022 Chronic systolic (congestive) heart failure: Secondary | ICD-10-CM | POA: Diagnosis not present

## 2017-04-17 NOTE — Telephone Encounter (Signed)
Left message for patient to return call.

## 2017-04-17 NOTE — Telephone Encounter (Signed)
    Please call cardiomems elevated.   Increase entresto to 49-51 mg twice a a day.   BMET in 7 days.

## 2017-04-17 NOTE — Progress Notes (Signed)
   Cardiomems Update   Implant Date 10/19/2016  RHC PCWP 11 PA Pressures 28/14 Fick CO 4.9 Fick CI 4.92    Implant Labs 10/20/2016 Creatinine 1.56 BUN 41 Potassium  4.2   PAD Range 15-20  Current PAD 21  meds  Torsemide 80 mg/40 mg    Current Labs  04/11/2017 -->K 4 Creatinine 1.6.   I have reviewed current PAD from cardiomems device readings weekly. PAD reading elevated.   Plan to increase entresto to 49-51 mg twice a day. Repeat BMET in 7 days.   Amy Clegg NP-C  4:06 PM

## 2017-04-19 DIAGNOSIS — R6883 Chills (without fever): Secondary | ICD-10-CM | POA: Diagnosis not present

## 2017-04-19 NOTE — Telephone Encounter (Signed)
Left message to call back  

## 2017-04-21 NOTE — Telephone Encounter (Signed)
Spoke w/pt's wife, she states can not tolerate higher dose of Entresto, per Dr Claris Gladden OV note 04/11/17: Continue current Entresto, unable to increase due to significant lightheadedness on higher dose.  Darrick Grinder, NP aware, pt will continue current dose, we will continue to monitor Cardiomems

## 2017-04-24 ENCOUNTER — Telehealth (HOSPITAL_COMMUNITY): Payer: Self-pay | Admitting: *Deleted

## 2017-04-24 DIAGNOSIS — H353132 Nonexudative age-related macular degeneration, bilateral, intermediate dry stage: Secondary | ICD-10-CM | POA: Diagnosis not present

## 2017-04-24 DIAGNOSIS — I5022 Chronic systolic (congestive) heart failure: Secondary | ICD-10-CM

## 2017-04-24 DIAGNOSIS — H43811 Vitreous degeneration, right eye: Secondary | ICD-10-CM | POA: Diagnosis not present

## 2017-04-24 DIAGNOSIS — H33301 Unspecified retinal break, right eye: Secondary | ICD-10-CM | POA: Diagnosis not present

## 2017-04-24 MED ORDER — TORSEMIDE 20 MG PO TABS: ORAL_TABLET | ORAL | Status: DC

## 2017-04-24 NOTE — Telephone Encounter (Signed)
Threshold: 15-20  Reading: 20   Instructions: per Darrick Grinder, NP increase Torsemide to 80 mg in AM and 60 mg in PM, bmet in 1 week.  Pt's wife aware and agreeable, lab sch for 2/12

## 2017-04-25 DIAGNOSIS — M47816 Spondylosis without myelopathy or radiculopathy, lumbar region: Secondary | ICD-10-CM | POA: Diagnosis not present

## 2017-04-26 DIAGNOSIS — H353132 Nonexudative age-related macular degeneration, bilateral, intermediate dry stage: Secondary | ICD-10-CM | POA: Diagnosis not present

## 2017-04-26 DIAGNOSIS — H33321 Round hole, right eye: Secondary | ICD-10-CM | POA: Diagnosis not present

## 2017-04-26 DIAGNOSIS — E113293 Type 2 diabetes mellitus with mild nonproliferative diabetic retinopathy without macular edema, bilateral: Secondary | ICD-10-CM | POA: Diagnosis not present

## 2017-04-28 ENCOUNTER — Telehealth (HOSPITAL_COMMUNITY): Payer: Self-pay | Admitting: Pharmacist

## 2017-04-28 NOTE — Telephone Encounter (Signed)
[name] called with a question about Entresto cost and PANF. [name] only has $2.65 left in his current grant but is eligible for a 2nd grant once the balance is 0. I have left a VM with them to call me back if he is needing a refill and will work with pharmacy to process through [facility] to deplete the grant and then apply for a 2nd grant for him.   Ruta Hinds. Velva Harman, PharmD, BCPS, CPP Clinical Pharmacist Phone: [phone] 04/28/2017 11:51 AM

## 2017-05-01 ENCOUNTER — Ambulatory Visit: Payer: Medicare Other | Admitting: Hematology & Oncology

## 2017-05-01 ENCOUNTER — Ambulatory Visit (INDEPENDENT_AMBULATORY_CARE_PROVIDER_SITE_OTHER): Payer: Medicare Other | Admitting: *Deleted

## 2017-05-01 ENCOUNTER — Other Ambulatory Visit: Payer: Medicare Other

## 2017-05-01 ENCOUNTER — Other Ambulatory Visit: Payer: Self-pay | Admitting: Internal Medicine

## 2017-05-01 DIAGNOSIS — I4821 Permanent atrial fibrillation: Secondary | ICD-10-CM

## 2017-05-01 DIAGNOSIS — I4891 Unspecified atrial fibrillation: Secondary | ICD-10-CM

## 2017-05-01 DIAGNOSIS — I482 Chronic atrial fibrillation: Secondary | ICD-10-CM

## 2017-05-01 LAB — POCT INR: INR: 1.4

## 2017-05-01 NOTE — Patient Instructions (Signed)
Description   Today and tomorrow take 1.5 tablets tablets then continue on same dosage 1 tablet everyday except 2 tablets on Wednesdays. Recheck in 1 week. Call with any new medications of changes  [phone] [facility].

## 2017-05-02 ENCOUNTER — Encounter: Payer: Medicare Other | Admitting: *Deleted

## 2017-05-02 ENCOUNTER — Ambulatory Visit (HOSPITAL_COMMUNITY)
Admission: RE | Admit: 2017-05-02 | Discharge: 2017-05-02 | Disposition: A | Discharge status: Home / Self Care | Payer: Medicare Other | Source: Ambulatory Visit | Attending: Cardiology | Admitting: Cardiology

## 2017-05-02 VITALS — BP 133/66 | HR 50 | Wt 239.0 lb

## 2017-05-02 DIAGNOSIS — Z006 Encounter for examination for normal comparison and control in clinical research program: Secondary | ICD-10-CM

## 2017-05-02 DIAGNOSIS — I5022 Chronic systolic (congestive) heart failure: Secondary | ICD-10-CM

## 2017-05-02 LAB — BASIC METABOLIC PANEL
Anion gap: 12 (ref 5–15)
BUN: 35 mg/dL — ABNORMAL HIGH (ref 6–20)
CALCIUM: 8.7 mg/dL — AB (ref 8.9–10.3)
CO2: 27 mmol/L (ref 22–32)
CREATININE: 1.55 mg/dL — AB (ref 0.61–1.24)
Chloride: 103 mmol/L (ref 101–111)
GFR calc non Af Amer: 42 mL/min — ABNORMAL LOW (ref >60)
GFR, EST AFRICAN AMERICAN: 48 mL/min — AB (ref >60)
GLUCOSE: 159 mg/dL — AB (ref 65–99)
Potassium: 3.6 mmol/L (ref 3.5–5.1)
Sodium: 142 mmol/L (ref 135–145)

## 2017-05-02 NOTE — Progress Notes (Addendum)
RESEARCH ENCOUNTER  Patient ID: [name]  DOB: 1940-12-12  [name] presented to the [facility] for Visit 6 of the [place] Trial.  No signs/symptoms of ACS, dizziness, or syncope since the last visit. In addition, no nitrate use or cardiac procedures since the last visit.  Subject compliant with IP, IP returned, and additional IP dispensed.    [facility] will [facility] with [facility].

## 2017-05-03 ENCOUNTER — Inpatient Hospital Stay: Payer: Medicare Other | Attending: Hematology & Oncology

## 2017-05-03 ENCOUNTER — Inpatient Hospital Stay: Payer: Medicare Other

## 2017-05-03 ENCOUNTER — Telehealth: Payer: Self-pay | Admitting: *Deleted

## 2017-05-03 ENCOUNTER — Other Ambulatory Visit: Payer: Self-pay

## 2017-05-03 ENCOUNTER — Ambulatory Visit (INDEPENDENT_AMBULATORY_CARE_PROVIDER_SITE_OTHER): Payer: Medicare Other | Admitting: *Deleted

## 2017-05-03 ENCOUNTER — Inpatient Hospital Stay (HOSPITAL_BASED_OUTPATIENT_CLINIC_OR_DEPARTMENT_OTHER): Payer: Medicare Other | Admitting: Hematology & Oncology

## 2017-05-03 VITALS — BP 105/54 | HR 64 | Resp 18

## 2017-05-03 VITALS — BP 128/67 | HR 83 | Temp 97.8°F | Resp 20 | Wt 241.0 lb

## 2017-05-03 DIAGNOSIS — I482 Chronic atrial fibrillation: Secondary | ICD-10-CM | POA: Diagnosis not present

## 2017-05-03 DIAGNOSIS — Z7901 Long term (current) use of anticoagulants: Secondary | ICD-10-CM | POA: Insufficient documentation

## 2017-05-03 DIAGNOSIS — Z79899 Other long term (current) drug therapy: Secondary | ICD-10-CM | POA: Insufficient documentation

## 2017-05-03 DIAGNOSIS — D45 Polycythemia vera: Secondary | ICD-10-CM

## 2017-05-03 DIAGNOSIS — I255 Ischemic cardiomyopathy: Secondary | ICD-10-CM | POA: Diagnosis not present

## 2017-05-03 DIAGNOSIS — D582 Other hemoglobinopathies: Secondary | ICD-10-CM

## 2017-05-03 LAB — CMP (CANCER CENTER ONLY)
ALBUMIN: 3.5 g/dL (ref 3.5–5.0)
ALK PHOS: 73 U/L (ref 26–84)
ALT: 13 U/L (ref 10–47)
AST: 13 U/L (ref 11–38)
Anion gap: 13 (ref 5–15)
BUN: 36 mg/dL — ABNORMAL HIGH (ref 7–22)
CHLORIDE: 104 mmol/L (ref 98–108)
CO2: 31 mmol/L (ref 18–33)
CREATININE: 1.4 mg/dL — AB (ref 0.60–1.20)
Calcium: 9.2 mg/dL (ref 8.0–10.3)
Glucose, Bld: 148 mg/dL — ABNORMAL HIGH (ref 73–118)
Potassium: 4.2 mmol/L (ref 3.3–4.7)
Sodium: 148 mmol/L — ABNORMAL HIGH (ref 128–145)
Total Bilirubin: 1.2 mg/dL (ref 0.2–1.6)
Total Protein: 6.6 g/dL (ref 6.4–8.1)

## 2017-05-03 LAB — CBC WITH DIFFERENTIAL (CANCER CENTER ONLY)
BASOS ABS: 0 10*3/uL (ref 0.0–0.1)
Basophils Relative: 0 %
EOS ABS: 0 10*3/uL (ref 0.0–0.5)
Eosinophils Relative: 0 %
HCT: 44.8 % (ref 38.7–49.9)
HEMOGLOBIN: 14.6 g/dL (ref 13.0–17.1)
Lymphocytes Relative: 10 %
Lymphs Abs: 0.9 10*3/uL (ref 0.9–3.3)
MCH: 30.4 pg (ref 28.0–33.4)
MCHC: 32.6 g/dL (ref 32.0–35.9)
MCV: 93.1 fL (ref 82.0–98.0)
MONOS PCT: 9 %
Monocytes Absolute: 0.8 10*3/uL (ref 0.1–0.9)
NEUTROS PCT: 81 %
Neutro Abs: 7.3 10*3/uL — ABNORMAL HIGH (ref 1.5–6.5)
Platelet Count: 175 10*3/uL (ref 145–400)
RBC: 4.81 MIL/uL (ref 4.20–5.70)
RDW: 15.5 % (ref 11.1–15.7)
WBC Count: 9 10*3/uL (ref 4.0–10.0)

## 2017-05-03 NOTE — Progress Notes (Signed)
Hematology and Oncology Follow Up Visit  [name] [id number] 1941-01-26 77 y.o. 05/03/2017   Principle Diagnosis:  Polycythemia vera  Current Therapy:   Phlebotomy to maintain hematocrit below 45%. Aspirin 81 mg by mouth weekly Coumadin for chronic atrial fibrillation   Interim History:  [name] is here today with his wife for follow-up.  We last saw him back in November.  He and his wife enjoyed the holidays.  They just got back from the beach.  They always go to the beach.  They will be headed back to the beach in a couple weeks.  Apparently, his implantable defibrillator went off.  He says that he did not know this.  He says that he was notified by his cardiologist.  He has not been hospitalized since we last saw him.  He is watching his blood sugars.  He is watching what he eats.  Overall, I would say that his performance status is ECOG 1-2.    Medications:  Allergies as of 05/03/2017      Reactions   Avelox [moxifloxacin Hcl In Nacl] Swelling, Rash, Other (See Comments)   Patient became hypotensive after infusion started Because of a history of documented adverse serious drug reaction;Medi Alert bracelet  is recommended   Penicillins Anaphylaxis, Swelling, Other (See Comments)   REACTION: anaphylaxis Because of a history of documented adverse serious drug reaction;Medi Alert bracelet  is recommended Has patient had a PCN reaction causing immediate rash, facial/tongue/throat swelling, SOB or lightheadedness with hypotension: Yes Has patient had a PCN reaction causing severe rash involving mucus membranes or skin necrosis: No Has patient had a PCN reaction that required hospitalization: Unknown Has patient had a PCN reaction occurring within the last 10 years: No      Medication List        Accurate as of 05/03/17  4:51 PM. Always use your most recent med list.          ACCU-CHEK AVIVA PLUS test strip Generic drug:  glucose blood USE 1 STRIP TO  CHECK GLUCOSE THREE TIMES DAILY AS DIRECTED   ACCU-CHEK FASTCLIX LANCETS Misc USE ONE LANCET TO CHECK GLUCOSE THREE TIMES DAILY   acetaminophen 325 MG tablet Commonly known as:  TYLENOL Take 325 mg by mouth 4 (four) times daily as needed for moderate pain.   allopurinol 100 MG tablet Commonly known as:  ZYLOPRIM TAKE TWO TABLETS BY MOUTH ONCE DAILY   atorvastatin 10 MG tablet Commonly known as:  LIPITOR Take 1 tablet (10 mg total) by mouth daily.   carboxymethylcellulose 0.5 % Soln Commonly known as:  REFRESH PLUS Place 2 drops into both eyes daily.   carvedilol 12.5 MG tablet Commonly known as:  COREG Take 1.5 tablets (18.75 mg total) by mouth 2 (two) times daily with a meal.   Cholecalciferol 1000 units tablet Take 1,000 Units by mouth daily.   colchicine 0.6 MG tablet Take 0.6 mg by mouth daily as needed (gout).   digoxin 0.125 MG tablet Commonly known as:  LANOXIN Take 0.5 tablets (0.0625 mg total) by mouth daily.   gabapentin 300 MG capsule Commonly known as:  NEURONTIN Take 2 capsules (600 mg total) by mouth 3 (three) times daily. TAKE ONE CAPSULE BY MOUTH 5 TIMES DAILY AS NEEDED FOR LEG PAIN   Investigational - Study Medication Take 1 tablet by mouth daily. Study name: [place] Additional study details: MK-1242 (vericiguat) or Placebo   ipratropium 0.06 % nasal spray Commonly known as:  ATROVENT Place 2  sprays into both nostrils daily.   lidocaine 5 % Commonly known as:  LIDODERM Place 1 patch onto the skin daily as needed for pain.   methocarbamol 750 MG tablet Commonly known as:  ROBAXIN Take 750 mg by mouth 2 (two) times daily as needed for muscle spasms.   nitroGLYCERIN 0.4 MG SL tablet Commonly known as:  NITROSTAT Place 1 tablet (0.4 mg total) under the tongue every 5 (five) minutes as needed for chest pain (MAX 3 TABLETS).   NOVOLIN N 100 UNIT/ML injection Generic drug:  insulin NPH Human Inject into the skin. Inject 8 units in the am and 6  units in the pm   NOVOLIN R 100 units/mL injection Generic drug:  insulin regular Inject 6 Units into the skin daily before supper.   omeprazole 20 MG capsule Commonly known as:  PRILOSEC Take 2 capsules (40 mg total) by mouth 2 (two) times daily.   potassium chloride SA 20 MEQ tablet Commonly known as:  K-DUR,KLOR-CON Take 1 tablet (20 mEq total) by mouth daily.   sacubitril-valsartan 24-26 MG Commonly known as:  ENTRESTO Take 1 tablet by mouth 2 (two) times daily.   torsemide 20 MG tablet Commonly known as:  DEMADEX Take 4 tablets in the AM and 3 tablets in the PM daily.   traMADol 50 MG tablet Commonly known as:  ULTRAM TAKE TWO TABLETS BY MOUTH EVERY 8 HOURS AS NEEDED   warfarin 2.5 MG tablet Commonly known as:  COUMADIN Take as directed by the anticoagulation clinic. If you are unsure how to take this medication, talk to your nurse or doctor. Original instructions:  TAKE AS DIRECTED BY ADMIN INSTRUCTIONS       Allergies:  Allergies  Allergen Reactions  . Avelox [Moxifloxacin Hcl In Nacl] Swelling, Rash and Other (See Comments)    Patient became hypotensive after infusion started Because of a history of documented adverse serious drug reaction;Medi Alert bracelet  is recommended  . Penicillins Anaphylaxis, Swelling and Other (See Comments)    REACTION: anaphylaxis Because of a history of documented adverse serious drug reaction;Medi Alert bracelet  is recommended Has patient had a PCN reaction causing immediate rash, facial/tongue/throat swelling, SOB or lightheadedness with hypotension: Yes Has patient had a PCN reaction causing severe rash involving mucus membranes or skin necrosis: No Has patient had a PCN reaction that required hospitalization: Unknown Has patient had a PCN reaction occurring within the last 10 years: No     Past Medical History, Surgical history, Social history, and Family History were reviewed and updated.  Review of Systems:  Review of  Systems  Constitutional: Negative.   HENT: Negative.   Eyes: Negative.   Respiratory: Positive for shortness of breath.   Cardiovascular: Positive for palpitations.  Gastrointestinal: Positive for constipation and heartburn.  Genitourinary: Positive for frequency.  Musculoskeletal: Positive for back pain and joint pain.  Skin: Negative.   Neurological: Positive for dizziness.  Endo/Heme/Allergies: Negative.   Psychiatric/Behavioral: Negative.      Physical Exam:  weight is 241 lb (109.3 kg). His oral temperature is 97.8 F (36.6 C). His blood pressure is 128/67 and his pulse is 83. His respiration is 20 and oxygen saturation is 98%.   Wt Readings from Last 3 Encounters:  05/03/17 241 lb (109.3 kg)  05/02/17 239 lb (108.4 kg)  04/12/17 243 lb (110.2 kg)    Physical Exam  Constitutional: He is oriented to person, place, and time.  HENT:  Head: Normocephalic and atraumatic.  Mouth/Throat:  Oropharynx is clear and moist.  Eyes: EOM are normal. Pupils are equal, round, and reactive to light.  Neck: Normal range of motion.  Cardiovascular: Normal rate, regular rhythm and normal heart sounds.  Pulmonary/Chest: Effort normal and breath sounds normal.  Abdominal: Soft. Bowel sounds are normal.  Musculoskeletal: Normal range of motion. He exhibits no edema, tenderness or deformity.  Lymphadenopathy:    He has no cervical adenopathy.  Neurological: He is alert and oriented to person, place, and time.  Skin: Skin is warm and dry. No rash noted. No erythema.  Psychiatric: He has a normal mood and affect. His behavior is normal. Judgment and thought content normal.  Vitals reviewed.    Lab Results  Component Value Date   WBC 9.0 05/03/2017   HGB 14.1 01/30/2017   HCT 44.8 05/03/2017   MCV 93.1 05/03/2017   PLT 175 05/03/2017   Lab Results  Component Value Date   FERRITIN 51 01/30/2017   IRON 88 01/30/2017   TIBC 318 01/30/2017   UIBC 230 01/30/2017   IRONPCTSAT 28  01/30/2017   Lab Results  Component Value Date   RETICCTPCT 1.4 04/02/2014   RBC 4.81 05/03/2017   RETICCTABS 75.2 04/02/2014   No results found for: KPAFRELGTCHN, LAMBDASER, KAPLAMBRATIO No results found for: IGGSERUM, IGA, IGMSERUM No results found for: Kathrynn Ducking, MSPIKE, SPEI   Chemistry      Component Value Date/Time   NA 148 (H) 05/03/2017 1308   NA 148 (H) 01/30/2017 1255   NA 141 03/09/2016 1320   K 4.2 05/03/2017 1308   K 4.3 01/30/2017 1255   K 4.5 03/09/2016 1320   CL 104 05/03/2017 1308   CL 101 01/30/2017 1255   CO2 31 05/03/2017 1308   CO2 28 01/30/2017 1255   CO2 25 03/09/2016 1320   BUN 36 (H) 05/03/2017 1308   BUN 43 (H) 01/30/2017 1255   BUN 28.8 (H) 03/09/2016 1320   CREATININE 1.40 (H) 05/03/2017 1308   CREATININE 2.1 (H) 01/30/2017 1255   CREATININE 1.9 (H) 03/09/2016 1320      Component Value Date/Time   CALCIUM 9.2 05/03/2017 1308   CALCIUM 9.4 01/30/2017 1255   CALCIUM 9.2 03/09/2016 1320   ALKPHOS 73 05/03/2017 1308   ALKPHOS 68 01/30/2017 1255   ALKPHOS 97 03/09/2016 1320   AST 13 05/03/2017 1308   AST 13 03/09/2016 1320   ALT 13 05/03/2017 1308   ALT 19 01/30/2017 1255   ALT 10 03/09/2016 1320   BILITOT 1.2 05/03/2017 1308   BILITOT 0.88 03/09/2016 1320      Impression and Plan: [name] is a very pleasant 44 Iyo caucasian gentleman with polycythemia. He continues to do well and has no complaints at this time.   We will go ahead and phlebotomize him today.  His hematocrit is close enough to 45% that we should phlebotomize him.  We will then plan to get him back in 3 more months.  Hopefully, he will not have any cardiac difficulties.  Volanda Napoleon, MD 2/13/20194:51 PM

## 2017-05-03 NOTE — Telephone Encounter (Signed)
Scheduled remote transmission received this morning indicating 1 shock on 04/08/17. [name] has since followed up with HF and research and had labs managed.  LMOM to return call to [facility].

## 2017-05-03 NOTE — Telephone Encounter (Signed)
Ms. Kruse [facility]) made aware of episode and driving restrictions x 6 months from 04/08/17 for Mr. Byrns. No other recommendations from Dr. Lovena Le. She verbalizes understanding.

## 2017-05-03 NOTE — Progress Notes (Signed)
Remote ICD transmission.   

## 2017-05-03 NOTE — Patient Instructions (Signed)

## 2017-05-03 NOTE — Telephone Encounter (Signed)
Reviewed with Dr. Lovena Le. D/t close follow-up with [facility] and recent lab work Dr. Lovena Le has no further recommendations. Per [place]- no driving x 6 months from 04/08/17.

## 2017-05-03 NOTE — Progress Notes (Signed)
[name] presents today for phlebotomy per MD orders. Phlebotomy procedure started at 1600 and ended at 1607. 516 cc removed via 16 G needle at R antecubital site. Patient tolerated procedure well. Discharged stable and ASX 30 min. post phlebotomy.

## 2017-05-04 ENCOUNTER — Encounter: Payer: Self-pay | Admitting: Cardiology

## 2017-05-04 DIAGNOSIS — H43811 Vitreous degeneration, right eye: Secondary | ICD-10-CM | POA: Diagnosis not present

## 2017-05-04 DIAGNOSIS — H353132 Nonexudative age-related macular degeneration, bilateral, intermediate dry stage: Secondary | ICD-10-CM | POA: Diagnosis not present

## 2017-05-04 LAB — IRON AND TIBC
Iron: 117 ug/dL (ref 42–163)
SATURATION RATIOS: 31 % — AB (ref 42–163)
TIBC: 380 ug/dL (ref 202–409)
UIBC: 263 ug/dL

## 2017-05-04 LAB — FERRITIN: FERRITIN: 43 ng/mL (ref 22–316)

## 2017-05-08 ENCOUNTER — Telehealth (HOSPITAL_COMMUNITY): Payer: Self-pay | Admitting: *Deleted

## 2017-05-08 DIAGNOSIS — I5032 Chronic diastolic (congestive) heart failure: Secondary | ICD-10-CM

## 2017-05-08 MED ORDER — TORSEMIDE 20 MG PO TABS: 80.0000 mg | ORAL_TABLET | Freq: Two times a day (BID) | ORAL | Status: DC

## 2017-05-08 NOTE — Telephone Encounter (Signed)
Threshold: 18-20  Reading: 21  Instructions: per Darrick Grinder, NP increase Torsemide to 80 mg Twice daily, bmet in 7-10 days   Spoke w/pt's wife, she is aware and agreeable.  Lab work sch for 05/22/17 as pt will be out of town from 05/09/17 through 05/20/17.

## 2017-05-09 ENCOUNTER — Ambulatory Visit (INDEPENDENT_AMBULATORY_CARE_PROVIDER_SITE_OTHER): Payer: Medicare Other | Admitting: Pharmacist

## 2017-05-09 DIAGNOSIS — I482 Chronic atrial fibrillation: Secondary | ICD-10-CM

## 2017-05-09 DIAGNOSIS — I4821 Permanent atrial fibrillation: Secondary | ICD-10-CM

## 2017-05-09 DIAGNOSIS — I4891 Unspecified atrial fibrillation: Secondary | ICD-10-CM | POA: Diagnosis not present

## 2017-05-09 LAB — POCT INR: INR: 1.7

## 2017-05-09 NOTE — Patient Instructions (Signed)
Description   Take 2 tablets today, then continue on same dosage 1 tablet every day except 2 tablets on Wednesdays. Recheck in 10 days. Call with any new medications of changes  [phone] [facility].

## 2017-05-17 ENCOUNTER — Other Ambulatory Visit: Payer: Self-pay | Admitting: Internal Medicine

## 2017-05-18 LAB — CUP PACEART REMOTE DEVICE CHECK
Battery Remaining Longevity: 44 mo
Battery Voltage: 2.97 V
Brady Statistic AS VS Percent: 67.41 %
Date Time Interrogation Session: 20190213072825
HIGH POWER IMPEDANCE MEASURED VALUE: 63 Ohm
Implantable Lead Implant Date: 20090121
Implantable Lead Implant Date: 20090121
Implantable Lead Location: 753858
Implantable Lead Model: 4193
Implantable Lead Model: 6935
Implantable Pulse Generator Implant Date: 20141124
Lead Channel Impedance Value: 342 Ohm
Lead Channel Impedance Value: 399 Ohm
Lead Channel Impedance Value: 4047 Ohm
Lead Channel Impedance Value: 437 Ohm
Lead Channel Sensing Intrinsic Amplitude: 12.75 mV
Lead Channel Sensing Intrinsic Amplitude: 3.75 mV
Lead Channel Sensing Intrinsic Amplitude: 3.75 mV
Lead Channel Setting Pacing Pulse Width: 0.4 ms
Lead Channel Setting Sensing Sensitivity: 0.3 mV
MDC IDC LEAD IMPLANT DT: 20090121
MDC IDC LEAD LOCATION: 753859
MDC IDC LEAD LOCATION: 753860
MDC IDC MSMT LEADCHNL LV IMPEDANCE VALUE: 4047 Ohm
MDC IDC MSMT LEADCHNL RA IMPEDANCE VALUE: 494 Ohm
MDC IDC MSMT LEADCHNL RV PACING THRESHOLD AMPLITUDE: 0.75 V
MDC IDC MSMT LEADCHNL RV PACING THRESHOLD PULSEWIDTH: 0.4 ms
MDC IDC MSMT LEADCHNL RV SENSING INTR AMPL: 12.75 mV
MDC IDC SET LEADCHNL RV PACING AMPLITUDE: 2.5 V
MDC IDC STAT BRADY AP VP PERCENT: 0 %
MDC IDC STAT BRADY AP VS PERCENT: 0 %
MDC IDC STAT BRADY AS VP PERCENT: 32.59 %
MDC IDC STAT BRADY RA PERCENT PACED: 0 %
MDC IDC STAT BRADY RV PERCENT PACED: 34.2 %

## 2017-05-19 ENCOUNTER — Other Ambulatory Visit: Payer: Self-pay | Admitting: Internal Medicine

## 2017-05-22 ENCOUNTER — Ambulatory Visit (HOSPITAL_COMMUNITY)
Admission: RE | Admit: 2017-05-22 | Discharge: 2017-05-22 | Disposition: A | Discharge status: Home / Self Care | Payer: Medicare Other | Source: Ambulatory Visit | Attending: Cardiology | Admitting: Cardiology

## 2017-05-22 ENCOUNTER — Other Ambulatory Visit (HOSPITAL_COMMUNITY): Payer: Medicare Other

## 2017-05-22 DIAGNOSIS — I5032 Chronic diastolic (congestive) heart failure: Secondary | ICD-10-CM | POA: Insufficient documentation

## 2017-05-22 LAB — BASIC METABOLIC PANEL
ANION GAP: 12 (ref 5–15)
BUN: 30 mg/dL — ABNORMAL HIGH (ref 6–20)
CO2: 27 mmol/L (ref 22–32)
Calcium: 8.5 mg/dL — ABNORMAL LOW (ref 8.9–10.3)
Chloride: 102 mmol/L (ref 101–111)
Creatinine, Ser: 1.63 mg/dL — ABNORMAL HIGH (ref 0.61–1.24)
GFR calc non Af Amer: 39 mL/min — ABNORMAL LOW (ref >60)
GFR, EST AFRICAN AMERICAN: 46 mL/min — AB (ref >60)
Glucose, Bld: 106 mg/dL — ABNORMAL HIGH (ref 65–99)
POTASSIUM: 4.1 mmol/L (ref 3.5–5.1)
SODIUM: 141 mmol/L (ref 135–145)

## 2017-05-23 ENCOUNTER — Ambulatory Visit: Payer: Medicare Other | Admitting: *Deleted

## 2017-05-23 DIAGNOSIS — I482 Chronic atrial fibrillation: Secondary | ICD-10-CM | POA: Diagnosis not present

## 2017-05-23 DIAGNOSIS — Z5181 Encounter for therapeutic drug level monitoring: Secondary | ICD-10-CM | POA: Diagnosis not present

## 2017-05-23 DIAGNOSIS — I4891 Unspecified atrial fibrillation: Secondary | ICD-10-CM

## 2017-05-23 DIAGNOSIS — I4821 Permanent atrial fibrillation: Secondary | ICD-10-CM

## 2017-05-23 LAB — POCT INR: INR: 1.8

## 2017-05-23 NOTE — Patient Instructions (Signed)
Description   Take 2 tablets today, then change your  dosage to 1 tablet every day except 2 tablets on Tuesdays and Saturdays.  Recheck in 2 weeks.  Call with any new medications of changes  [phone] [facility].

## 2017-05-24 ENCOUNTER — Encounter (HOSPITAL_COMMUNITY): Payer: Self-pay | Admitting: Cardiology

## 2017-05-30 ENCOUNTER — Telehealth (HOSPITAL_COMMUNITY): Payer: Self-pay | Admitting: Pharmacist

## 2017-05-30 NOTE — Telephone Encounter (Signed)
Received a call from Mrs. Boney stating that Mr. Hayk Divis grant ran out. Unfortunately, the [facility] is currently closed. Mrs. Hoeffner stated that they should be able to afford it for the time being ($45/mo). I will re-enroll Mr. Dilone once PANF reopens.   Ruta Hinds. Velva Harman, PharmD, BCPS, CPP Clinical Pharmacist Phone: [phone] 05/30/2017 9:33 AM

## 2017-06-05 ENCOUNTER — Ambulatory Visit (HOSPITAL_COMMUNITY)
Admission: RE | Admit: 2017-06-05 | Discharge: 2017-06-05 | Disposition: A | Discharge status: Home / Self Care | Payer: Medicare Other | Source: Ambulatory Visit | Attending: Cardiology | Admitting: Cardiology

## 2017-06-05 ENCOUNTER — Ambulatory Visit: Payer: Medicare Other | Admitting: *Deleted

## 2017-06-05 DIAGNOSIS — I482 Chronic atrial fibrillation: Secondary | ICD-10-CM | POA: Diagnosis not present

## 2017-06-05 DIAGNOSIS — I4891 Unspecified atrial fibrillation: Secondary | ICD-10-CM | POA: Insufficient documentation

## 2017-06-05 DIAGNOSIS — I5032 Chronic diastolic (congestive) heart failure: Secondary | ICD-10-CM | POA: Diagnosis not present

## 2017-06-05 DIAGNOSIS — I4821 Permanent atrial fibrillation: Secondary | ICD-10-CM

## 2017-06-05 LAB — POCT INR: INR: 2.1

## 2017-06-05 NOTE — Progress Notes (Signed)
   Cardiomems Update   Implant Date 10/19/2016  RHC PCWP 11 PA Pressures 28/14 Fick CO 4.9 Fick CI 4.92    Implant Labs 10/20/2016 Creatinine 1.56 BUN 41 Potassium  4.2   PAD Range 15-20  Current PAD 18   meds  Torsemide 80 mg/40 mg  .  Current Labs  04/11/2017 -->K 4 Creatinine 1.6.  Results for [name], [name] (MRN [id number]) as of 06/05/2017 13:42  Ref. Range 05/03/2017 13:08 05/03/2017 13:09 05/09/2017 15:25 05/22/2017 13:50  Creatinine Latest Ref Range: 0.61 - 1.24 mg/dL 1.40 (H)   1.63 (H)   I have reviewed current PAD from cardiomems device. PAD reading stable.    Continue current diuretic regimen.    Darnell Level NP-C  1:42 PM

## 2017-06-05 NOTE — Addendum Note (Signed)
Encounter addended by: [name], RN on: 06/05/2017 4:15 PM  Actions taken: Charge Capture section accepted

## 2017-06-19 ENCOUNTER — Telehealth (HOSPITAL_COMMUNITY): Payer: Self-pay | Admitting: *Deleted

## 2017-06-19 DIAGNOSIS — M47816 Spondylosis without myelopathy or radiculopathy, lumbar region: Secondary | ICD-10-CM | POA: Diagnosis not present

## 2017-06-19 NOTE — Telephone Encounter (Signed)
Threshold: 15-20  Reading: 23  Instructions: per Darrick Grinder, NP take an extra 20 mg of Torsemide for 2 days.  Attempted to call pt and wife, Left message to call back

## 2017-06-19 NOTE — Telephone Encounter (Signed)
pts wife called back, advised of extra Torsemide for 2 days, she is agreeable and verbalizes understanding.

## 2017-06-20 ENCOUNTER — Other Ambulatory Visit: Payer: Self-pay | Admitting: Neurosurgery

## 2017-06-20 ENCOUNTER — Ambulatory Visit (HOSPITAL_COMMUNITY)
Admission: RE | Admit: 2017-06-20 | Discharge: 2017-06-20 | Disposition: A | Discharge status: Home / Self Care | Payer: Medicare Other | Source: Ambulatory Visit | Attending: Cardiology | Admitting: Cardiology

## 2017-06-20 ENCOUNTER — Other Ambulatory Visit (HOSPITAL_COMMUNITY): Payer: Self-pay

## 2017-06-20 DIAGNOSIS — I5022 Chronic systolic (congestive) heart failure: Secondary | ICD-10-CM | POA: Insufficient documentation

## 2017-06-20 DIAGNOSIS — M47816 Spondylosis without myelopathy or radiculopathy, lumbar region: Secondary | ICD-10-CM

## 2017-06-20 LAB — BASIC METABOLIC PANEL
Anion gap: 11 (ref 5–15)
BUN: 27 mg/dL — AB (ref 6–20)
CALCIUM: 8.9 mg/dL (ref 8.9–10.3)
CO2: 27 mmol/L (ref 22–32)
CREATININE: 1.45 mg/dL — AB (ref 0.61–1.24)
Chloride: 104 mmol/L (ref 101–111)
GFR, EST AFRICAN AMERICAN: 52 mL/min — AB (ref >60)
GFR, EST NON AFRICAN AMERICAN: 45 mL/min — AB (ref >60)
Glucose, Bld: 138 mg/dL — ABNORMAL HIGH (ref 65–99)
Potassium: 3.6 mmol/L (ref 3.5–5.1)
SODIUM: 142 mmol/L (ref 135–145)

## 2017-06-22 ENCOUNTER — Ambulatory Visit: Payer: Medicare Other | Admitting: Pharmacist

## 2017-06-22 DIAGNOSIS — I4891 Unspecified atrial fibrillation: Secondary | ICD-10-CM

## 2017-06-22 DIAGNOSIS — I482 Chronic atrial fibrillation: Secondary | ICD-10-CM

## 2017-06-22 DIAGNOSIS — I4821 Permanent atrial fibrillation: Secondary | ICD-10-CM

## 2017-06-22 LAB — POCT INR: INR: 2.8

## 2017-06-22 NOTE — Patient Instructions (Signed)
Description   Continue 1 tablet every day except 2 tablets on Tuesdays and Saturdays.  Recheck in 3 weeks (pt out of town until May 16th).  Call with any new medications of changes  [phone] [facility].

## 2017-06-27 ENCOUNTER — Other Ambulatory Visit: Payer: Self-pay | Admitting: Internal Medicine

## 2017-06-28 ENCOUNTER — Other Ambulatory Visit: Payer: Self-pay | Admitting: Internal Medicine

## 2017-07-03 ENCOUNTER — Other Ambulatory Visit (INDEPENDENT_AMBULATORY_CARE_PROVIDER_SITE_OTHER): Payer: Medicare Other

## 2017-07-03 DIAGNOSIS — E7800 Pure hypercholesterolemia, unspecified: Secondary | ICD-10-CM | POA: Diagnosis not present

## 2017-07-03 DIAGNOSIS — Z794 Long term (current) use of insulin: Secondary | ICD-10-CM | POA: Diagnosis not present

## 2017-07-03 DIAGNOSIS — E1165 Type 2 diabetes mellitus with hyperglycemia: Secondary | ICD-10-CM | POA: Diagnosis not present

## 2017-07-03 LAB — LIPID PANEL
CHOLESTEROL: 119 mg/dL (ref 0–200)
HDL: 41.6 mg/dL (ref >39.00)
LDL CALC: 52 mg/dL (ref 0–99)
NONHDL: 77.1
Total CHOL/HDL Ratio: 3
Triglycerides: 127 mg/dL (ref 0.0–149.0)
VLDL: 25.4 mg/dL (ref 0.0–40.0)

## 2017-07-03 LAB — HEMOGLOBIN A1C: HEMOGLOBIN A1C: 7 % — AB (ref 4.6–6.5)

## 2017-07-03 LAB — GLUCOSE, RANDOM: GLUCOSE: 149 mg/dL — AB (ref 70–99)

## 2017-07-04 ENCOUNTER — Other Ambulatory Visit: Payer: Self-pay

## 2017-07-04 ENCOUNTER — Ambulatory Visit (HOSPITAL_BASED_OUTPATIENT_CLINIC_OR_DEPARTMENT_OTHER)
Admission: RE | Admit: 2017-07-04 | Discharge: 2017-07-04 | Disposition: A | Discharge status: Home / Self Care | Payer: Medicare Other | Source: Ambulatory Visit | Attending: Cardiology | Admitting: Cardiology

## 2017-07-04 ENCOUNTER — Ambulatory Visit (HOSPITAL_COMMUNITY)
Admission: RE | Admit: 2017-07-04 | Discharge: 2017-07-04 | Disposition: A | Discharge status: Home / Self Care | Payer: Medicare Other | Source: Ambulatory Visit | Attending: Cardiology | Admitting: Cardiology

## 2017-07-04 ENCOUNTER — Encounter (HOSPITAL_COMMUNITY): Payer: Self-pay | Admitting: Cardiology

## 2017-07-04 VITALS — BP 121/70 | HR 54 | Wt 238.0 lb

## 2017-07-04 DIAGNOSIS — I5022 Chronic systolic (congestive) heart failure: Secondary | ICD-10-CM | POA: Diagnosis not present

## 2017-07-04 DIAGNOSIS — I482 Chronic atrial fibrillation: Secondary | ICD-10-CM | POA: Diagnosis not present

## 2017-07-04 DIAGNOSIS — N644 Mastodynia: Secondary | ICD-10-CM | POA: Diagnosis not present

## 2017-07-04 DIAGNOSIS — M109 Gout, unspecified: Secondary | ICD-10-CM | POA: Insufficient documentation

## 2017-07-04 DIAGNOSIS — R42 Dizziness and giddiness: Secondary | ICD-10-CM | POA: Diagnosis not present

## 2017-07-04 DIAGNOSIS — E1122 Type 2 diabetes mellitus with diabetic chronic kidney disease: Secondary | ICD-10-CM | POA: Diagnosis not present

## 2017-07-04 DIAGNOSIS — M545 Low back pain: Secondary | ICD-10-CM | POA: Insufficient documentation

## 2017-07-04 DIAGNOSIS — I712 Thoracic aortic aneurysm, without rupture, unspecified: Secondary | ICD-10-CM

## 2017-07-04 DIAGNOSIS — Z79899 Other long term (current) drug therapy: Secondary | ICD-10-CM | POA: Insufficient documentation

## 2017-07-04 DIAGNOSIS — I2510 Atherosclerotic heart disease of native coronary artery without angina pectoris: Secondary | ICD-10-CM | POA: Diagnosis not present

## 2017-07-04 DIAGNOSIS — I371 Nonrheumatic pulmonary valve insufficiency: Secondary | ICD-10-CM | POA: Insufficient documentation

## 2017-07-04 DIAGNOSIS — I071 Rheumatic tricuspid insufficiency: Secondary | ICD-10-CM | POA: Insufficient documentation

## 2017-07-04 DIAGNOSIS — I130 Hypertensive heart and chronic kidney disease with heart failure and stage 1 through stage 4 chronic kidney disease, or unspecified chronic kidney disease: Secondary | ICD-10-CM | POA: Diagnosis not present

## 2017-07-04 DIAGNOSIS — I252 Old myocardial infarction: Secondary | ICD-10-CM | POA: Insufficient documentation

## 2017-07-04 DIAGNOSIS — E785 Hyperlipidemia, unspecified: Secondary | ICD-10-CM | POA: Diagnosis not present

## 2017-07-04 DIAGNOSIS — J449 Chronic obstructive pulmonary disease, unspecified: Secondary | ICD-10-CM | POA: Insufficient documentation

## 2017-07-04 DIAGNOSIS — Z951 Presence of aortocoronary bypass graft: Secondary | ICD-10-CM | POA: Diagnosis not present

## 2017-07-04 DIAGNOSIS — Z7901 Long term (current) use of anticoagulants: Secondary | ICD-10-CM | POA: Insufficient documentation

## 2017-07-04 DIAGNOSIS — Z794 Long term (current) use of insulin: Secondary | ICD-10-CM | POA: Insufficient documentation

## 2017-07-04 DIAGNOSIS — Z48812 Encounter for surgical aftercare following surgery on the circulatory system: Secondary | ICD-10-CM | POA: Insufficient documentation

## 2017-07-04 DIAGNOSIS — Z8679 Personal history of other diseases of the circulatory system: Secondary | ICD-10-CM | POA: Diagnosis not present

## 2017-07-04 DIAGNOSIS — N183 Chronic kidney disease, stage 3 (moderate): Secondary | ICD-10-CM | POA: Diagnosis not present

## 2017-07-04 DIAGNOSIS — I4821 Permanent atrial fibrillation: Secondary | ICD-10-CM

## 2017-07-04 NOTE — Patient Instructions (Signed)
Non-Cardiac CT scanning, (CAT scanning), is a noninvasive, special x-ray that produces cross-sectional images of the body using x-rays and a computer. CT scans help physicians diagnose and treat medical conditions. For some CT exams, a contrast material is used to enhance visibility in the area of the body being studied. CT scans provide greater clarity and reveal more details than regular x-ray exams.  Your physician recommends that you schedule a follow-up appointment in: 3 months with Dr. Aundra Dubin

## 2017-07-05 ENCOUNTER — Other Ambulatory Visit (HOSPITAL_COMMUNITY): Payer: Self-pay

## 2017-07-05 DIAGNOSIS — I712 Thoracic aortic aneurysm, without rupture, unspecified: Secondary | ICD-10-CM

## 2017-07-05 NOTE — Progress Notes (Signed)
PCP: Dr. Quay Burow Cardiologist: Dr Aundra Dubin   HPI: [name] is a 77 y.o. with history of CAD s/p CABG, permanent atrial fib, S/P AAA repair, ischemic cardiomyopathy/chronic systolic heart failure,  Medtronic CRT-D system.   Admitted 4/14 through 07/06/16 with volume overload. Diuresed with IV lasix and transitioned to torsemide 60 mg daily. HF meds adjusted and he was started on Entresto. Discharge weight was 220 pounds.  He now has a Cardiomems device. Torsemide has been increased to 80 mg bid based on Cardiomems evaluation.   He returns for followup of CHF and CAD.  He remains in atrial fibrillation (permanent).  We have been following his Cardiomems, goal PADP 15-20 (18 today).  Echo in 4/19 showed EF improved to 40-45%.  He stopped eplerenone due to breast pain which has resolved (same thing with spironolactone). No chest pain.  No significant exertional dyspnea.  No orthopnea/PND.  Main complaint, is low back pain, will have myelogram soon.   PADP 18 mmHg by CardioMems    Medtronic device interrogation: No VT, fluid index < threshold with stable thoracic impedance.   Labs (5/18): K 4.7, creatinine 1.7, hgb 15.8, BNP 1001, LDL 71 Labs (8/18): K 4.3, creatinine 1.56, hgb 14.3 Labs (9/18): K 4, creatinine 1.79, digoxin 0.8 Labs (10/18): digoxin 0.3 Labs (11/18): K 4, creatinine 1.71 Labs (4/19): K 3.6, creatinine 1.45, LDL 52, HDL 42  PMH: 1. CAD: s/p CABG.  - LHC (12/17): totally occluded distal RCA, totally occluded mid LAD, totally occluded OM2 and OM3.  LIMA-LAD patent with 60% ostial stenosis.  SVG-OM3 and SVG-PDA were patent. Medical management.  2. Chronic systolic CHF: Ischemic cardiomyopathy.  He has a Medtronic CRT-D device.  - Echo (12/17) with EF 35-40%, diffuse hypokinesis, normal RV size and systolic function, mild [name].  - CardioMems placed.  - Painful gynecomastia with spironolactone and eplerenone.  - Echo (4/19) with EF 40-45%, mild [name].  3. AAA: s/p repair.  4. Atrial  fibrillation: Permanent.  5. Gout 6. PAD: Saw Dr Scot Dock with [facility] in 4/18.  Noninvasive study concerning for infra-inguinal arterial occlusive disease bilaterally but appeared to have adequate circulation to heal toe ulcer.  7. CKD stage 3.  8. Ascending aortic aneurysm: CT chest 3/18 ascending aorta 4.9 cm, arch 4.4 cm.  9. Spinal stenosis 10. Polycythemia vera 11. Hyperlipidemia 12. Type II diabetes    ROS: All systems negative except as listed in HPI, PMH and Problem List.  Social History   Socioeconomic History  . Marital status: Married    Spouse name: Not on file  . Number of children: Not on file  . Years of education: Not on file  . Highest education level: Not on file  Occupational History  . Occupation: Retired- Nurse, mental health  Social Needs  . Financial resource strain: Not on file  . Food insecurity:    Worry: Not on file    Inability: Not on file  . Transportation needs:    Medical: Not on file    Non-medical: Not on file  Tobacco Use  . Smoking status: Former Smoker    Packs/day: 3.00    Years: 39.00    Pack years: 117.00    Types: Cigarettes    Start date: 05/24/1956    Last attempt to quit: 03/22/1995    Years since quitting: 22.3  . Smokeless tobacco: Never Used  Substance and Sexual Activity  . Alcohol use: Yes    Alcohol/week: 1.2 oz    Types: 2 Cans of beer  per week  . Drug use: No  . Sexual activity: Never  Lifestyle  . Physical activity:    Days per week: Not on file    Minutes per session: Not on file  . Stress: Not on file  Relationships  . Social connections:    Talks on phone: Not on file    Gets together: Not on file    Attends religious service: Not on file    Active member of club or organization: Not on file    Attends meetings of clubs or organizations: Not on file    Relationship status: Not on file  . Intimate partner violence:    Fear of current or ex partner: Not on file    Emotionally abused: Not on file     Physically abused: Not on file    Forced sexual activity: Not on file  Other Topics Concern  . Not on file  Social History Narrative  . Not on file    Family History  Problem Relation Age of Onset  . Hypertension Mother   . Stroke Mother   . Diabetes Father   . Coronary artery disease Father   . Other Father        DVT  . Diabetes Brother   . Colon cancer Neg Hx   . Heart attack Neg Hx      Current Outpatient Medications  Medication Sig Dispense Refill  . ACCU-CHEK AVIVA PLUS test strip USE 1 STRIP TO CHECK GLUCOSE THREE TIMES DAILY AS DIRECTED 100 each 3  . ACCU-CHEK FASTCLIX LANCETS MISC USE ONE LANCET TO CHECK GLUCOSE THREE TIMES DAILY 102 each 5  . acetaminophen (TYLENOL) 325 MG tablet Take 325 mg by mouth 4 (four) times daily as needed for moderate pain.     Marland Kitchen allopurinol (ZYLOPRIM) 100 MG tablet TAKE TWO TABLETS BY MOUTH ONCE DAILY 180 tablet 3  . atorvastatin (LIPITOR) 10 MG tablet Take 1 tablet (10 mg total) by mouth daily. Overdue for follow-up appt must see Md for future refills 30 tablet 0  . carboxymethylcellulose (REFRESH PLUS) 0.5 % SOLN Place 2 drops into both eyes daily.     . carvedilol (COREG) 12.5 MG tablet Take 1.5 tablets (18.75 mg total) by mouth 2 (two) times daily with a meal. 90 tablet 6  . Cholecalciferol 1000 units tablet Take 1,000 Units by mouth daily.    . colchicine 0.6 MG tablet Take 0.6 mg by mouth daily as needed (gout).     Marland Kitchen digoxin (LANOXIN) 0.125 MG tablet Take 0.5 tablets (0.0625 mg total) by mouth daily. 15 tablet 6  . gabapentin (NEURONTIN) 300 MG capsule Take 2 capsules (600 mg total) by mouth 3 (three) times daily. TAKE ONE CAPSULE BY MOUTH 5 TIMES DAILY AS NEEDED FOR LEG PAIN 540 capsule 3  . insulin NPH Human (NOVOLIN N) 100 UNIT/ML injection Inject into the skin. Inject 8 units in the am and 6 units in the pm    . insulin regular (NOVOLIN R) 100 units/mL injection Inject 6 Units into the skin daily before supper.     . Investigational  - Study Medication Take 1 tablet by mouth daily. Study name: [place] Additional study details: MK-1242 (vericiguat) or Placebo 1 each PRN  . ipratropium (ATROVENT) 0.06 % nasal spray Place 2 sprays into both nostrils daily.     Marland Kitchen lidocaine (LIDODERM) 5 % Place 1 patch onto the skin daily as needed for pain.    . methocarbamol (ROBAXIN) 750 MG tablet  Take 750 mg by mouth 2 (two) times daily as needed for muscle spasms.    . nitroGLYCERIN (NITROSTAT) 0.4 MG SL tablet Place 1 tablet (0.4 mg total) under the tongue every 5 (five) minutes as needed for chest pain (MAX 3 TABLETS). 25 tablet 3  . omeprazole (PRILOSEC) 20 MG capsule Take 2 capsules (40 mg total) by mouth 2 (two) times daily. 120 capsule 11  . potassium chloride SA (K-DUR,KLOR-CON) 20 MEQ tablet Take 1 tablet (20 mEq total) by mouth daily. 90 tablet 3  . sacubitril-valsartan (ENTRESTO) 24-26 MG Take 1 tablet by mouth 2 (two) times daily. 60 tablet 11  . torsemide (DEMADEX) 20 MG tablet Take 4 tablets (80 mg total) by mouth 2 (two) times daily.    . traMADol (ULTRAM) 50 MG tablet TAKE TWO TABLETS BY MOUTH EVERY 8 HOURS AS NEEDED 180 tablet 2  . warfarin (COUMADIN) 2.5 MG tablet TAKE AS DIRECTED BY ADMIN INSTRUCTIONS 120 tablet 1   No current facility-administered medications for this encounter.     Vitals:   07/04/17 1346  BP: 121/70  Pulse: (!) 54  SpO2: 98%  Weight: 238 lb (108 kg)    PHYSICAL EXAM: General: NAD Neck: No JVD, no thyromegaly or thyroid nodule.  Lungs: Clear to auscultation bilaterally with normal respiratory effort. CV: Nondisplaced PMI.  Heart regular S1/S2, no S3/S4, no murmur. 1+ ankle edema.  No carotid bruit.  Normal pedal pulses.  Abdomen: Soft, nontender, no hepatosplenomegaly, no distention.  Skin: Intact without lesions or rashes.  Neurologic: Alert and oriented x 3.  Psych: Normal affect. Extremities: No clubbing or cyanosis.  HEENT: Normal.   ASSESSMENT & PLAN: 1. Chronic Systolic Heart  Failure: Ischemic cardiomyopathy. Echo 4/19 with EF 40-45%, some improvement. Medtronic CRT-D.  NYHA class II.   Exam and CardioMems both suggest stable volume.  - Continue torsemide 80 mg bid.  He is going to be going down to the beach soon, asked him to try to avoid high sodium restaurant food as much as possible.  - Continue digoxin.  - Continue current Entresto, unable to increase due to significant lightheadedness on higher dose.  - Unable to take eplerenone or spironolactone due to breast tenderness.  This has mostly resolved off these meds.   - Continue Coreg 18.75 mg bid.  2. Atrial fibrillation: Permanent.  Rate-controlled.  Continue warfarin.   3. CAD: s/p CABG.  No chest pain.  - No ASA given warfarin use.  - Continue statin.  Good lipids 4/19.   4. CKD Stage III: Recent creatinine 1.45.  5. Gout: stable. Continue allopurinol.  6. Ascending aortic aneurysm: 3/18 CTA 4.9 cm ascending aorta.   - will repeat noncontrast CT chest to re-evaluate.   Followup in 3 months.   Loralie Champagne  07/05/2017

## 2017-07-06 ENCOUNTER — Other Ambulatory Visit (HOSPITAL_COMMUNITY): Payer: Self-pay | Admitting: Neurological Surgery

## 2017-07-06 DIAGNOSIS — M47816 Spondylosis without myelopathy or radiculopathy, lumbar region: Secondary | ICD-10-CM

## 2017-07-16 NOTE — Progress Notes (Signed)
Patient ID: [name], male   DOB: [birth year], 77 y.o.   MRN: [id number]    Reason for Appointment : Follow-up for Type 2 Diabetes  History of Present Illness          Diagnosis: Type 2 diabetes mellitus, date of diagnosis: 2008       Past history: He was initially treated with Metformin and also given Amayl at some point. With this he had fair control of his diabetes with A1c had usually been over 7% except once in 2013.  He was taken off metformin probably in March this year after a hospitalization, possibly because of renal dysfunction He was started on Levemir insulin in May when his blood sugars were significantly higher, fasting readings averaging 220.  Levemir progressively increased and was on 35 units at night since 12/20/12 He has not been on any other medications either orally or injectable for his diabetes Because of his A1c of over 10% in 10/14 on basal insulin alone he was started on NovoLog with each meal in 10/14  Recent history:     INSULIN regimen is described as: Novolin N 8 units in the morning and 6 at bedtime, Regular Insulin 6 units at supper  A1c is now 7%, previously 6.8     Current management, blood sugar patterns and problems identified:  His blood sugars are less variable than on the last visit  He is generally trying to do fairly well with diet and watch his portions and types of foods better than before  FASTING readings are very consistent and averaging only 125  No hypoglycemia  He is taking his regular insulin as much as possible 30 minutes before suppertime  However still has not lost any weight with improving diet  Generally checking sugars 2 hours after lunch and supper and these are clearly consistent  Compliance with the medical regimen: Good   Glucose monitoring:  done Up to 2 times a day        Glucometer:  Accu-Chek    Blood Glucose readings from download:  Mean values apply above for all meters except median for  One Touch  PRE-MEAL Fasting Lunch Dinner Bedtime Overall  Glucose range:      97-178  Mean/median:  125    130   POST-MEAL PC Breakfast PC Lunch PC Dinner  Glucose range:     Mean/median:   130  135     Self-care: The diet that the patient has been following is: Smaller portions and relatively balanced meals  Meals: 2 meals per day. Noon and 6 pm   Low fat meals At dinnertime. Avoiding drinks with sugar and no juices        Physical activity: exercise: Minimal, limited by back pain              Dietician visit: Most recent: Unknown.  CDE visit: 11/14                Weight control:    Wt Readings from Last 3 Encounters:  07/17/17 247 lb (112 kg)  07/04/17 238 lb (108 kg)  05/03/17 241 lb (109.3 kg)    Lab Results  Component Value Date   HGBA1C 7.0 (H) 07/03/2017   HGBA1C 6.8 (H) 04/11/2017   HGBA1C 7.5 12/08/2016   Lab Results  Component Value Date   MICROALBUR 3.3 (H) 09/02/2016   LDLCALC 52 07/03/2017   CREATININE 1.45 (H) 06/20/2017    Other active problems: See review  of systems   Allergies as of 07/17/2017      Reactions   Avelox [moxifloxacin Hcl In Nacl] Swelling, Rash, Other (See Comments)   Patient became hypotensive after infusion started Because of a history of documented adverse serious drug reaction;Medi Alert bracelet  is recommended   Penicillins Anaphylaxis, Swelling, Other (See Comments)   REACTION: anaphylaxis Because of a history of documented adverse serious drug reaction;Medi Alert bracelet  is recommended Has patient had a PCN reaction causing immediate rash, facial/tongue/throat swelling, SOB or lightheadedness with hypotension: Yes Has patient had a PCN reaction causing severe rash involving mucus membranes or skin necrosis: No Has patient had a PCN reaction that required hospitalization: Unknown Has patient had a PCN reaction occurring within the last 10 years: No      Medication List        Accurate as of 07/17/17  1:54 PM. Always  use your most recent med list.          ACCU-CHEK AVIVA PLUS test strip Generic drug:  glucose blood USE 1 STRIP TO CHECK GLUCOSE THREE TIMES DAILY AS DIRECTED   ACCU-CHEK FASTCLIX LANCETS Misc USE ONE LANCET TO CHECK GLUCOSE THREE TIMES DAILY   acetaminophen 325 MG tablet Commonly known as:  TYLENOL Take 650-975 mg by mouth 4 (four) times daily as needed for moderate pain.   allopurinol 100 MG tablet Commonly known as:  ZYLOPRIM TAKE TWO TABLETS BY MOUTH ONCE DAILY   atorvastatin 10 MG tablet Commonly known as:  LIPITOR Take 1 tablet (10 mg total) by mouth daily. Overdue for follow-up appt must see Md for future refills   carboxymethylcellulose 0.5 % Soln Commonly known as:  REFRESH PLUS Place 2 drops into both eyes daily.   carvedilol 12.5 MG tablet Commonly known as:  COREG Take 1.5 tablets (18.75 mg total) by mouth 2 (two) times daily with a meal.   Cholecalciferol 1000 units tablet Take 1,000 Units by mouth daily.   cholecalciferol 1000 units tablet Commonly known as:  VITAMIN D Take 1,000 Units by mouth daily.   colchicine 0.6 MG tablet Take 0.6 mg by mouth daily as needed (gout flares).   digoxin 0.125 MG tablet Commonly known as:  LANOXIN Take 0.5 tablets (0.0625 mg total) by mouth daily.   gabapentin 300 MG capsule Commonly known as:  NEURONTIN Take 2 capsules (600 mg total) by mouth 3 (three) times daily. TAKE ONE CAPSULE BY MOUTH 5 TIMES DAILY AS NEEDED FOR LEG PAIN   Investigational - Study Medication Take 1 tablet by mouth daily. Study name: [place] Additional study details: MK-1242 (vericiguat) or Placebo   ipratropium 0.06 % nasal spray Commonly known as:  ATROVENT Place 2 sprays into both nostrils daily.   lidocaine 5 % Commonly known as:  LIDODERM Place 1 patch onto the skin daily as needed (for pain.).   methocarbamol 750 MG tablet Commonly known as:  ROBAXIN Take 750 mg by mouth 2 (two) times daily as needed for muscle spasms.     metroNIDAZOLE 0.75 % gel Commonly known as:  METROGEL Apply 1 application topically daily as needed. FOR ROSACEA   nitroGLYCERIN 0.4 MG SL tablet Commonly known as:  NITROSTAT Place 1 tablet (0.4 mg total) under the tongue every 5 (five) minutes as needed for chest pain (MAX 3 TABLETS).   NOVOLIN N 100 UNIT/ML injection Generic drug:  insulin NPH Human Inject 6-8 Units into the skin See admin instructions. Inject 8 units subcutaneously in the morning and inject 6  units subcutaneously in the evening.   NOVOLIN R 100 units/mL injection Generic drug:  insulin regular Inject 6 Units into the skin daily before supper.   omeprazole 20 MG capsule Commonly known as:  PRILOSEC Take 2 capsules (40 mg total) by mouth 2 (two) times daily.   potassium chloride SA 20 MEQ tablet Commonly known as:  K-DUR,KLOR-CON Take 1 tablet (20 mEq total) by mouth daily.   sacubitril-valsartan 24-26 MG Commonly known as:  ENTRESTO Take 1 tablet by mouth 2 (two) times daily.   torsemide 20 MG tablet Commonly known as:  DEMADEX Take 4 tablets (80 mg total) by mouth 2 (two) times daily.   traMADol 50 MG tablet Commonly known as:  ULTRAM TAKE TWO TABLETS BY MOUTH EVERY 8 HOURS AS NEEDED   warfarin 2.5 MG tablet Commonly known as:  COUMADIN Take as directed by the anticoagulation clinic. If you are unsure how to take this medication, talk to your nurse or doctor. Original instructions:  TAKE AS DIRECTED BY ADMIN INSTRUCTIONS       Allergies:  Allergies  Allergen Reactions  . Avelox [Moxifloxacin Hcl In Nacl] Swelling, Rash and Other (See Comments)    Patient became hypotensive after infusion started Because of a history of documented adverse serious drug reaction;Medi Alert bracelet  is recommended  . Penicillins Anaphylaxis, Swelling and Other (See Comments)    REACTION: anaphylaxis Because of a history of documented adverse serious drug reaction;Medi Alert bracelet  is recommended Has patient  had a PCN reaction causing immediate rash, facial/tongue/throat swelling, SOB or lightheadedness with hypotension: Yes Has patient had a PCN reaction causing severe rash involving mucus membranes or skin necrosis: No Has patient had a PCN reaction that required hospitalization: Unknown Has patient had a PCN reaction occurring within the last 10 years: No     Past Medical History:  Diagnosis Date  . AAA (abdominal aortic aneurysm) ([place])    Surgical repair 11/2002.  . Adenomatous colon polyp   . Alcohol ingestion of more than four drinks per week    Excess beer  not a dependency problem  . Aortic valve sclerosis   . Arthritis   . Atrial fibrillation ([place])    Previous long-term amiodarone therapy with multiple cardioversions / amiodarone stopped September, 2009  . Atrial flutter [facility])    Started November, 2010, Left-sided and cannot ablate  . Bony abnormality    Patient's manubrium is slightly displaced to the right  . CAD (coronary artery disease)    a. s/p CABG 2004;  b. 02/2016 Cath: LM nl, LAD 70p, 190m, D1 50, D2 50, RI 50ost, LCX 53m, OM2 100, OM3 100, RCA 70p, 100d, VG->OM3 ok, LIMA->LAD 60ost, VG->RPDA  ok.  . 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 01/2013 MDT DTBA 1D1 [place] CRT-D, ser # [id number] H.  . Carotid artery disease ([place])    Doppler, December, 2013, 0-39% bilateral  . Chronic systolic CHF (congestive heart failure) ([place])    a. 02/2016 Echo: EF 35-40%, diff HK, triv AI, mildly dil Ao root 86mm, mild MR, sev dil LA, triv TR.  Marland Kitchen Chronotropic incompetence    IV pacing rate adjusted  . CKD (chronic kidney disease), stage III ([place])   . COPD (chronic obstructive pulmonary disease) ([place])   . Dilated aortic root ([place])   . Discolored skin   . Diverticulosis   . Drug therapy    Redness and swelling with Avelox infusion May 24, 2011  . Eye  abnormality    Ophthalmologist questions a clot in one of his eyes, May, 2012  . Gout   .  Hyperlipidemia   . Hypertension   . Internal hemorrhoids   . Ischemic cardiomyopathy   . Left atrial thrombus    Remote past... cardioversions done since that time  . Mitral regurgitation    Mild echo  . Myocardial infarction ([place])   . Nasal drainage    Chronic  . Overweight(278.02)   . Pericardial effusion   . Pleural effusion    Large loculated effusion on the left side November, 2011. This was tapped. It was exudative. Cytology revealed no cancer no proof of mesothelioma area pulmonary team felt that no further workup was needed  . Pleural thickening   . Pneumonia   . Polycythemia vera ([place]) 07/28/2014  . SOB (shortness of breath)    Large left effusion/ thoracentesis/hospitalization/November, 2011... exudated.. cytology negative.. Dr.Wert.. no proof of mesothelioma  . Spinal stenosis    Surgery Dr.Elsner  . Thrombophlebitis of superficial veins of upper extremities    Possible venous stenosis from defibrillator  . Type II diabetes mellitus ([place]) 2008  . Venous insufficiency    Toe discoloration chronic  . Ventral hernia    April, 2014, result of his abdominal surgery  . Warfarin anticoagulation   . Wide-complex tachycardia ([place])     Past Surgical History:  Procedure Laterality Date  . ABDOMINAL AORTIC ANEURYSM REPAIR  11/2002  . ANTERIOR CERVICAL DECOMP/DISCECTOMY FUSION  1995  . BACK SURGERY    . BI-VENTRICULAR PACEMAKER INSERTION (CRT-P)  02-11-2013   Pt with previously implanted MDT CRTD downgraded to CRTP by Dr Lovena Le 02-11-13  . CARDIAC CATHETERIZATION  "several"  . CARDIAC CATHETERIZATION N/A 03/18/2016   Procedure: RIGHT/LEFT HEART CATH AND CORONARY/GRAFT ANGIOGRAPHY;  Surgeon: Nelva Bush, MD;  Location: [facility];  Service: Cardiovascular;  Laterality: N/A;  . CATARACT EXTRACTION W/ INTRAOCULAR LENS  IMPLANT, BILATERAL    . COLONOSCOPY W/ POLYPECTOMY    . CORONARY ANGIOPLASTY WITH STENT PLACEMENT  2004  . CORONARY ARTERY BYPASS GRAFT  2004   CABG  X4  . FRACTURE SURGERY    . IMPLANTABLE CARDIOVERTER DEFIBRILLATOR (ICD) GENERATOR CHANGE N/A 02/11/2013   Procedure: ICD GENERATOR CHANGE;  Surgeon: Evans Lance, MD;  Location: [facility] CATH LAB;  Service: Cardiovascular;  Laterality: N/A;  . INCISION AND DRAINAGE ABSCESS / HEMATOMA OF BURSA / KNEE / THIGH    . INSERT / REPLACE / REMOVE PACEMAKER  2009   original pacer/defibrillator; Dr. Lovena Le 2009... by the pacing  . LUMBAR FUSION  2010  . ORIF TIBIA & FIBULA FRACTURES Right 2000s  . PILONIDAL CYST EXCISION    . RIGHT HEART CATH N/A 10/19/2016   Procedure: Right Heart Cath;  Surgeon: [name], MD;  Location: [facility];  Service: Cardiovascular;  Laterality: N/A;  . SPINAL CORD STIMULATOR IMPLANT  12/2011  . SURGERY SCROTAL / TESTICULAR      Family History  Problem Relation Age of Onset  . Hypertension Mother   . Stroke Mother   . Diabetes Father   . Coronary artery disease Father   . Other Father        DVT  . Diabetes Brother   . Colon cancer Neg Hx   . Heart attack Neg Hx     Social History:  reports that he quit smoking about 22 years ago. His smoking use included cigarettes. He started smoking about 61 years ago.  He has a 117.00 pack-year smoking history. He has never used smokeless tobacco. He reports that he drinks about 1.2 oz of alcohol per week. He reports that he does not use drugs.    Review of Systems       Lipids: Treated with Lipitor and is taking 10 mg, Followed by PCP and cardiologist  LDL is under 70 consistently   Lab Results  Component Value Date   CHOL 119 07/03/2017   HDL 41.60 07/03/2017   LDLCALC 52 07/03/2017   TRIG 127.0 07/03/2017   CHOLHDL 3 07/03/2017       The blood pressure has been under control with current regimen of Spironolactone, Carvedilol  Followed closely by cardiology  Renal dysfunction: Creatinine has improved  Lab Results  Component Value Date   CREATININE 1.45 (H) 06/20/2017   CREATININE 1.63 (H)  05/22/2017   CREATININE 1.40 (H) 05/03/2017   CREATININE 1.55 (H) 05/02/2017   CREATININE 1.61 (H) 04/11/2017   CREATININE 1.82 (H) 04/07/2017      Has known neuropathy: He has had Long-standing numbness, pains and tingling in his feet and lower legs.   Numbness is worse in the mornings and while resting  Takes gabapentin for this and uses 2 tablet at lunch, 2 tablets at suppertime and 1 tablet at bedtime He is asking for better treatment   Physical Examination:  BP 132/72 (BP Location: Left Arm, Patient Position: Sitting, Cuff Size: Normal)   Pulse (!) 59   Ht 6' (1.829 m)   Wt 247 lb (112 kg)   SpO2 94%   BMI 33.50 kg/m       ASSESSMENT/PLAN:  Diabetes type 2 on insulin See history of present illness for detailed discussion of his current management, blood sugar patterns and problems identified  His A1c is excellent at 7% Blood sugars are remarkably consistent at all times and he is not having postprandial hyperglycemia Getting by with relatively small doses of insulin with mealtime dose only at suppertime He will continue the same regimen  NEUROPATHY: Persistent symptoms and he desires a different medication Discussed Lyrica but likely this would be too expensive for him and he may also have side effects with the higher doses  He agrees to a trial of Cymbalta 30 mg daily at suppertime and let us know in 30 days if he would like to increase this At the same time if he has less symptoms he will try to taper down his gabapentin dose progressively Continue gabapentin  There are no Patient Instructions on file for this visit.   Elayne Snare 07/17/2017, 1:54 PM

## 2017-07-17 ENCOUNTER — Encounter: Payer: Self-pay | Admitting: Endocrinology

## 2017-07-17 ENCOUNTER — Ambulatory Visit: Payer: Medicare Other | Admitting: Endocrinology

## 2017-07-17 VITALS — BP 132/72 | HR 59 | Ht 72.0 in | Wt 247.0 lb

## 2017-07-17 DIAGNOSIS — E1142 Type 2 diabetes mellitus with diabetic polyneuropathy: Secondary | ICD-10-CM

## 2017-07-17 MED ORDER — DULOXETINE HCL 30 MG PO CPEP: 30.0000 mg | ORAL_CAPSULE | Freq: Every day | ORAL | 0 refills | Status: DC

## 2017-07-18 ENCOUNTER — Encounter (HOSPITAL_COMMUNITY): Payer: Self-pay

## 2017-07-20 ENCOUNTER — Ambulatory Visit (HOSPITAL_COMMUNITY)
Admission: RE | Admit: 2017-07-20 | Discharge: 2017-07-20 | Disposition: A | Discharge status: Home / Self Care | Payer: Medicare Other | Source: Ambulatory Visit | Attending: Neurological Surgery | Admitting: Neurological Surgery

## 2017-07-20 DIAGNOSIS — M4805 Spinal stenosis, thoracolumbar region: Secondary | ICD-10-CM | POA: Insufficient documentation

## 2017-07-20 DIAGNOSIS — R918 Other nonspecific abnormal finding of lung field: Secondary | ICD-10-CM | POA: Diagnosis not present

## 2017-07-20 DIAGNOSIS — M48061 Spinal stenosis, lumbar region without neurogenic claudication: Secondary | ICD-10-CM | POA: Insufficient documentation

## 2017-07-20 DIAGNOSIS — M2578 Osteophyte, vertebrae: Secondary | ICD-10-CM | POA: Insufficient documentation

## 2017-07-20 DIAGNOSIS — I700 Atherosclerosis of aorta: Secondary | ICD-10-CM | POA: Insufficient documentation

## 2017-07-20 DIAGNOSIS — M5416 Radiculopathy, lumbar region: Secondary | ICD-10-CM | POA: Diagnosis not present

## 2017-07-20 DIAGNOSIS — M4326 Fusion of spine, lumbar region: Secondary | ICD-10-CM | POA: Diagnosis not present

## 2017-07-20 DIAGNOSIS — M47816 Spondylosis without myelopathy or radiculopathy, lumbar region: Secondary | ICD-10-CM | POA: Diagnosis present

## 2017-07-20 DIAGNOSIS — I714 Abdominal aortic aneurysm, without rupture: Secondary | ICD-10-CM | POA: Diagnosis not present

## 2017-07-20 DIAGNOSIS — J90 Pleural effusion, not elsewhere classified: Secondary | ICD-10-CM | POA: Diagnosis not present

## 2017-07-20 DIAGNOSIS — M4726 Other spondylosis with radiculopathy, lumbar region: Secondary | ICD-10-CM | POA: Diagnosis not present

## 2017-07-20 DIAGNOSIS — M9973 Connective tissue and disc stenosis of intervertebral foramina of lumbar region: Secondary | ICD-10-CM | POA: Diagnosis not present

## 2017-07-20 LAB — GLUCOSE, CAPILLARY: GLUCOSE-CAPILLARY: 116 mg/dL — AB (ref 65–99)

## 2017-07-20 MED ORDER — DIAZEPAM 5 MG PO TABS
5.0000 mg | ORAL_TABLET | Freq: Once | ORAL | Status: AC
  Administered 2017-07-20: 5 mg via ORAL
  Filled 2017-07-20: qty 1

## 2017-07-20 MED ORDER — IOPAMIDOL (ISOVUE-M 200) INJECTION 41%
20.0000 mL | Freq: Once | INTRAMUSCULAR | Status: AC
  Administered 2017-07-20: 15 mL via INTRATHECAL

## 2017-07-20 MED ORDER — OXYCODONE-ACETAMINOPHEN 5-325 MG PO TABS: 1.0000 | ORAL_TABLET | ORAL | Status: DC | PRN

## 2017-07-20 MED ORDER — LIDOCAINE HCL (PF) 1 % IJ SOLN
INTRAMUSCULAR | Status: AC
  Administered 2017-07-20: 3 mL via INTRADERMAL
  Filled 2017-07-20: qty 5

## 2017-07-20 MED ORDER — ONDANSETRON HCL 4 MG/2ML IJ SOLN: 4.0000 mg | Freq: Four times a day (QID) | INTRAMUSCULAR | Status: DC | PRN

## 2017-07-20 MED ORDER — IOPAMIDOL (ISOVUE-M 200) INJECTION 41%
INTRAMUSCULAR | Status: AC
  Administered 2017-07-20: 15 mL via INTRATHECAL
  Filled 2017-07-20: qty 10

## 2017-07-20 MED ORDER — LIDOCAINE HCL (PF) 1 % IJ SOLN
5.0000 mL | Freq: Once | INTRAMUSCULAR | Status: AC
  Administered 2017-07-20: 3 mL via INTRADERMAL

## 2017-07-20 MED ORDER — DIAZEPAM 5 MG PO TABS
ORAL_TABLET | ORAL | Status: AC
  Administered 2017-07-20: 5 mg via ORAL
  Filled 2017-07-20: qty 1

## 2017-07-20 NOTE — Discharge Instructions (Signed)
Myelogram, Care After °These instructions give you information about caring for yourself after your procedure. Your doctor may also give you more specific instructions. Call your doctor if you have any problems or questions after your procedure. °Follow these instructions at home: °· Drink enough fluid to keep your pee (urine) clear or pale yellow. °· Rest as told by your doctor. °· Lie flat with your head slightly raised (elevated). °· Do not bend, lift, or do any hard activities for 24-48 hours or as told by your doctor. °· Take over-the-counter and prescription medicines only as told by your doctor. °· Take care of and remove your bandage (dressing) as told by your doctor. °· Bathe or shower as told by your doctor. °Contact a health care provider if: °· You have a fever. °· You have a headache that lasts longer than 24 hours. °· You feel sick to your stomach (nauseous). °· You throw up (vomit). °· Your neck is stiff. °· Your legs feel numb. °· You cannot pee. °· You cannot poop (have a bowel movement). °· You have a rash. °· You are itchy or sneezing. °Get help right away if: °· You have new symptoms or your symptoms get worse. °· You have a seizure. °· You have trouble breathing. °This information is not intended to replace advice given to you by your health care provider. Make sure you discuss any questions you have with your health care provider. °Document Released: 12/15/2007 Document Revised: 11/05/2015 Document Reviewed: 12/18/2014 °Elsevier Interactive Patient Education © 2018 [employer] ° °

## 2017-07-20 NOTE — Procedures (Signed)
[name] is a 77 year old male who has had extensive lumbar decompression and fusion in the past from L2 to the sacrum.  He has had issues of chronic back pain and leg pain has been seen and evaluated by Dr. Clydell Hakim.  He has had a spinal cord stimulator placed.  Because of this he cannot have an MRI.  About a month ago he experienced a sudden onset of pain in his back in the midportion of his lumbar spine with radicular symptoms into both lower extremities and particularly pain behind the left calf this is persisted and is not improved despite the passage of time Dr. Maryjean Ka suggests that he might need a myelogram and he is now being evaluated myelographically in the lumbar spine.  Pre op Dx: Lumbar radiculopathy chronic Post op Dx: Chronic lumbar radiculopathy Procedure: Lumbar myelogram Surgeon: Cadence Minton Puncture level: L3-4 Fluid color: Clear colorless Injection: Isovue-200, 15 mL Findings: No significant areas of stenosis noted throughout the lumbar spine.  Follow-up CT from T10-S1

## 2017-07-24 ENCOUNTER — Ambulatory Visit
Admission: RE | Admit: 2017-07-24 | Discharge: 2017-07-24 | Disposition: A | Discharge status: Home / Self Care | Payer: Medicare Other | Source: Ambulatory Visit | Attending: Cardiology | Admitting: Cardiology

## 2017-07-24 DIAGNOSIS — I712 Thoracic aortic aneurysm, without rupture, unspecified: Secondary | ICD-10-CM

## 2017-07-26 DIAGNOSIS — I10 Essential (primary) hypertension: Secondary | ICD-10-CM | POA: Diagnosis not present

## 2017-07-26 DIAGNOSIS — M549 Dorsalgia, unspecified: Secondary | ICD-10-CM | POA: Diagnosis not present

## 2017-07-28 ENCOUNTER — Ambulatory Visit: Payer: Medicare Other | Admitting: *Deleted

## 2017-07-28 ENCOUNTER — Telehealth (HOSPITAL_COMMUNITY): Payer: Self-pay | Admitting: *Deleted

## 2017-07-28 DIAGNOSIS — I4891 Unspecified atrial fibrillation: Secondary | ICD-10-CM | POA: Diagnosis not present

## 2017-07-28 DIAGNOSIS — I712 Thoracic aortic aneurysm, without rupture, unspecified: Secondary | ICD-10-CM

## 2017-07-28 DIAGNOSIS — I482 Chronic atrial fibrillation: Secondary | ICD-10-CM | POA: Diagnosis not present

## 2017-07-28 DIAGNOSIS — Z5181 Encounter for therapeutic drug level monitoring: Secondary | ICD-10-CM | POA: Diagnosis not present

## 2017-07-28 DIAGNOSIS — I4821 Permanent atrial fibrillation: Secondary | ICD-10-CM

## 2017-07-28 LAB — POCT INR: INR: 1.7

## 2017-07-28 NOTE — Telephone Encounter (Signed)
Result Notes for CT Chest Wo Contrast   Notes recorded by Darron Doom, RN on 07/28/2017 at 1:59 PM EDT Patient's wife called back and I explained the results and recommendations. She agrees with referral to [facility] to see Dr. Servando Snare. Referral placed. No further questions. 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 Think about repair generally at 5.5 cm. I think it is time to refer to [facility] for further evaluation, refer to Dr. Servando Snare.

## 2017-07-28 NOTE — Patient Instructions (Signed)
Description   Today take 2 tablets, then Continue 1 tablet every day except 2 tablets on Tuesdays and Saturdays.  Recheck in 3-4 weeks.  Call with any new medications of changes  [phone] [facility].

## 2017-07-31 ENCOUNTER — Ambulatory Visit: Payer: Medicare Other | Admitting: Family

## 2017-07-31 ENCOUNTER — Other Ambulatory Visit: Payer: Medicare Other

## 2017-07-31 DIAGNOSIS — H6123 Impacted cerumen, bilateral: Secondary | ICD-10-CM | POA: Diagnosis not present

## 2017-07-31 DIAGNOSIS — H903 Sensorineural hearing loss, bilateral: Secondary | ICD-10-CM | POA: Diagnosis not present

## 2017-08-01 NOTE — Progress Notes (Signed)
Subjective:    Patient ID: [name], male    DOB: 23-Aug-1940, 77 y.o.   MRN: [id number]  HPI The patient is here for follow up.  Diabetes with diabetic polyneuropathy: he follows with Dr Dwyane Dee.  He is taking his medication daily as prescribed. He takes gabapentin for the neuropathy - we increased this dose 6 months ago. He was started on Cymbalta, but is not taking this on a daily basis-he takes it as needed. He is compliant with a diabetic diet. He is not exercising regularly.  He checks his feet daily and denies foot lesions. He is up-to-date with an ophthalmology examination.   CAD, chronic systolic heart failure, Afib, Hypertension: He is taking his medication daily. He is compliant with a low sodium diet.  He denies chest pain, palpitations, and regular headaches.  He does get called if he is fluid overloaded-he has had 5 calls in the last year, which has prevented hospitalizations.  He is not exercising regularly.     CKD: His kidney function has been fairly stable-he does have this monitored by his cardiologist.  He is taking all his medications as prescribed, including his diuretic.  Chronic back pain: he is following with neurosurgery.  He takes the tramadol twice daily.  He will have an injection in the next few week.  He has chronic back pain, leg weakness and tightening in his lower back when he walks.  He is unable to walk far.    Medications and allergies reviewed with patient and updated if appropriate.  Patient Active Problem List   Diagnosis Date Noted  . Abnormal CXR 05/29/2016  . Effusion of left elbow 04/13/2016  . Angina pectoris ([place]) 03/15/2016  . History of gout 10/09/2015  . Polycythemia vera ([place]) 07/28/2014  . Ascending aortic aneurysm ([place]) 05/10/2014  . Hypoxia 05/05/2014  . COPD exacerbation ([place]) 05/05/2014  . Diabetes ([place]) 05/05/2014  . Chronic systolic CHF (congestive heart failure) ([place]) 03/07/2014  . History of DVT (deep vein thrombosis)  03/07/2014  . Cardiomyopathy, ischemic 02/05/2014  . Pleural plaque without asbestosis 01/23/2014  . Elevated hemoglobin ([place]) 01/14/2014  . CKD (chronic kidney disease) stage 3, GFR 30-59 ml/min (HCC) 01/14/2014  . Asbestos exposure 01/14/2014  . Shortness of breath 01/01/2014  . Ejection fraction   . Hypopotassemia 12/20/2012  . ACE-inhibitor cough 10/04/2012  . Ventral hernia   . Bony abnormality   . Carotid artery disease ([place])   . Spinal cord stimulator status   . Peripheral vascular disease with claudication ([place]) 08/31/2011  . Atrial fibrillation with rapid ventricular response ([place])   . Spinal stenosis   . CAD (coronary artery disease)   . Venous insufficiency   . Mitral regurgitation   . Aortic valve sclerosis   . Chronotropic incompetence   . Thrombophlebitis of superficial veins of upper extremities   . Pericardial effusion   . Overweight(278.02)   . Pleural effusion   . Hyperlipidemia   . Hypertension   . Atrial flutter ([place])   . Left atrial thrombus   . AAA (abdominal aortic aneurysm) ([place])   . S/P ICD (internal cardiac defibrillator) procedure   . Warfarin anticoagulation   . Diabetic polyneuropathy ([place]) 01/07/2010  . DIVERTICULOSIS, COLON 06/10/2009  . BACK PAIN, LUMBAR 06/12/2008  . 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 Acute gout 07/27/2006  . CORONARY ARTERY BYPASS GRAFT, HX OF 07/27/2006    Current Outpatient Medications on File Prior to Visit  Medication  Sig Dispense Refill  . ACCU-CHEK AVIVA PLUS test strip USE 1 STRIP TO CHECK GLUCOSE THREE TIMES DAILY AS DIRECTED 100 each 3  . ACCU-CHEK FASTCLIX LANCETS MISC USE ONE LANCET TO CHECK GLUCOSE THREE TIMES DAILY 102 each 5  . acetaminophen (TYLENOL) 325 MG tablet Take 650-975 mg by mouth 4 (four) times daily as needed for moderate pain.     Marland Kitchen allopurinol (ZYLOPRIM) 100 MG tablet TAKE TWO TABLETS BY MOUTH ONCE DAILY (Patient taking differently: TAKE TWO TABLETS BY MOUTH ONCE DAILY IN  THE EVENING.) 180 tablet 3  . atorvastatin (LIPITOR) 10 MG tablet Take 1 tablet (10 mg total) by mouth daily. Overdue for follow-up appt must see Md for future refills 30 tablet 0  . carboxymethylcellulose (REFRESH PLUS) 0.5 % SOLN Place 2 drops into both eyes daily.     . carvedilol (COREG) 12.5 MG tablet Take 1.5 tablets (18.75 mg total) by mouth 2 (two) times daily with a meal. 90 tablet 6  . cholecalciferol (VITAMIN D) 1000 units tablet Take 1,000 Units by mouth daily.    . colchicine 0.6 MG tablet Take 0.6 mg by mouth daily as needed (gout flares).     Marland Kitchen digoxin (LANOXIN) 0.125 MG tablet Take 0.5 tablets (0.0625 mg total) by mouth daily. 15 tablet 6  . DULoxetine (CYMBALTA) 30 MG capsule Take 1 capsule (30 mg total) by mouth daily. 30 capsule 0  . gabapentin (NEURONTIN) 300 MG capsule Take 2 capsules (600 mg total) by mouth 3 (three) times daily. TAKE ONE CAPSULE BY MOUTH 5 TIMES DAILY AS NEEDED FOR LEG PAIN (Patient taking differently: Take 300-600 mg by mouth See admin instructions. TAKE 2 CAPSULES (600 MG) IN THE MORNING, 2 CAPSULES (600 MG) IN THE EVENING, & 1 CAPSULE (300 MG) BY MOUTH AT BEDTIME.) 540 capsule 3  . insulin NPH Human (NOVOLIN N) 100 UNIT/ML injection Inject 6-8 Units into the skin See admin instructions. Inject 8 units subcutaneously in the morning and inject 6 units subcutaneously in the evening.    . insulin regular (NOVOLIN R) 100 units/mL injection Inject 6 Units into the skin daily before supper.     . Investigational - Study Medication Take 1 tablet by mouth daily. Study name: [place] Additional study details: [id number] (vericiguat) or Placebo 1 each PRN  . ipratropium (ATROVENT) 0.06 % nasal spray Place 2 sprays into both nostrils daily.     Marland Kitchen lidocaine (LIDODERM) 5 % Place 1 patch onto the skin daily as needed (for pain.).     Marland Kitchen methocarbamol (ROBAXIN) 750 MG tablet Take 750 mg by mouth 2 (two) times daily as needed for muscle spasms.    . metroNIDAZOLE (METROGEL) 0.75  % gel Apply 1 application topically daily as needed. FOR ROSACEA  2  . nitroGLYCERIN (NITROSTAT) 0.4 MG SL tablet Place 1 tablet (0.4 mg total) under the tongue every 5 (five) minutes as needed for chest pain (MAX 3 TABLETS). 25 tablet 3  . omeprazole (PRILOSEC) 20 MG capsule Take 2 capsules (40 mg total) by mouth 2 (two) times daily. 120 capsule 11  . potassium chloride SA (K-DUR,KLOR-CON) 20 MEQ tablet Take 1 tablet (20 mEq total) by mouth daily. 90 tablet 3  . sacubitril-valsartan (ENTRESTO) 24-26 MG Take 1 tablet by mouth 2 (two) times daily. 60 tablet 11  . torsemide (DEMADEX) 20 MG tablet Take 4 tablets (80 mg total) by mouth 2 (two) times daily.    . traMADol (ULTRAM) 50 MG tablet TAKE TWO TABLETS BY MOUTH  EVERY 8 HOURS AS NEEDED (Patient taking differently: TAKE TWO TABLETS BY MOUTH EVERY 8 HOURS AS NEEDED FOR PAIN.) 180 tablet 2  . warfarin (COUMADIN) 2.5 MG tablet TAKE AS DIRECTED BY ADMIN INSTRUCTIONS (Patient taking differently: TAKE 2 TABLETS (5 MG) BY MOUTH ON TUESDAYS AND SATURDAYS, THEN TAKE 1 TABLET (2.5 MG) BY MOUTH ON ALL OTHER DAYS.) 120 tablet 1   No current facility-administered medications on file prior to visit.     Past Medical History:  Diagnosis Date  . AAA (abdominal aortic aneurysm) ([place])    Surgical repair 11/2002.  . Adenomatous colon polyp   . Alcohol ingestion of more than four drinks per week    Excess beer  not a dependency problem  . Aortic valve sclerosis   . Arthritis   . Atrial fibrillation ([place])    Previous long-term amiodarone therapy with multiple cardioversions / amiodarone stopped September, 2009  . Atrial flutter [facility])    Started November, 2010, Left-sided and cannot ablate  . Bony abnormality    Patient's manubrium is slightly displaced to the right  . CAD (coronary artery disease)    a. s/p CABG 2004;  b. 02/2016 Cath: LM nl, LAD 70p, 166m, D1 50, D2 50, RI 50ost, LCX 15m, OM2 100, OM3 100, RCA 70p, 100d, VG->OM3 ok, LIMA->LAD 60ost, VG->RPDA   ok.  . 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 01/2013 MDT DTBA 1D1 [place] CRT-D, ser # [id number].  . Carotid artery disease ([place])    Doppler, December, 2013, 0-39% bilateral  . Chronic systolic CHF (congestive heart failure) ([place])    a. 02/2016 Echo: EF 35-40%, diff HK, triv AI, mildly dil Ao root 32mm, mild MR, sev dil LA, triv TR.  Marland Kitchen Chronotropic incompetence    IV pacing rate adjusted  . CKD (chronic kidney disease), stage III ([place])   . COPD (chronic obstructive pulmonary disease) ([place])   . Dilated aortic root ([place])   . Discolored skin   . Diverticulosis   . Drug therapy    Redness and swelling with Avelox infusion May 24, 2011  . Eye abnormality    Ophthalmologist questions a clot in one of his eyes, May, 2012  . Gout   . Hyperlipidemia   . Hypertension   . Internal hemorrhoids   . Ischemic cardiomyopathy   . Left atrial thrombus    Remote past... cardioversions done since that time  . Mitral regurgitation    Mild echo  . Myocardial infarction ([place])   . Nasal drainage    Chronic  . Overweight(278.02)   . Pericardial effusion   . Pleural effusion    Large loculated effusion on the left side November, 2011. This was tapped. It was exudative. Cytology revealed no cancer no proof of mesothelioma area pulmonary team felt that no further workup was needed  . Pleural thickening   . Pneumonia   . Polycythemia vera ([place]) 07/28/2014  . SOB (shortness of breath)    Large left effusion/ thoracentesis/hospitalization/November, 2011... exudated.. cytology negative.. Dr.Wert.. no proof of mesothelioma  . Spinal stenosis    Surgery Dr.Elsner  . Thrombophlebitis of superficial veins of upper extremities    Possible venous stenosis from defibrillator  . Type II diabetes mellitus ([place]) 2008  . Venous insufficiency    Toe discoloration chronic  . Ventral hernia    April, 2014, result of his abdominal surgery  . Warfarin anticoagulation   .  Wide-complex tachycardia ([place])  Past Surgical History:  Procedure Laterality Date  . ABDOMINAL AORTIC ANEURYSM REPAIR  11/2002  . ANTERIOR CERVICAL DECOMP/DISCECTOMY FUSION  1995  . BACK SURGERY    . BI-VENTRICULAR PACEMAKER INSERTION (CRT-P)  02-11-2013   Pt with previously implanted MDT CRTD downgraded to CRTP by Dr Lovena Le 02-11-13  . CARDIAC CATHETERIZATION  "several"  . CARDIAC CATHETERIZATION N/A 03/18/2016   Procedure: RIGHT/LEFT HEART CATH AND CORONARY/GRAFT ANGIOGRAPHY;  Surgeon: Nelva Bush, MD;  Location: [facility];  Service: Cardiovascular;  Laterality: N/A;  . CATARACT EXTRACTION W/ INTRAOCULAR LENS  IMPLANT, BILATERAL    . COLONOSCOPY W/ POLYPECTOMY    . CORONARY ANGIOPLASTY WITH STENT PLACEMENT  2004  . CORONARY ARTERY BYPASS GRAFT  2004   CABG X4  . FRACTURE SURGERY    . IMPLANTABLE CARDIOVERTER DEFIBRILLATOR (ICD) GENERATOR CHANGE N/A 02/11/2013   Procedure: ICD GENERATOR CHANGE;  Surgeon: Evans Lance, MD;  Location[facility] CATH LAB;  Service: Cardiovascular;  Laterality: N/A;  . INCISION AND DRAINAGE ABSCESS / HEMATOMA OF BURSA / KNEE / THIGH    . INSERT / REPLACE / REMOVE PACEMAKER  2009   original pacer/defibrillator; Dr. Lovena Le 2009... by the pacing  . LUMBAR FUSION  2010  . ORIF TIBIA & FIBULA FRACTURES Right 2000s  . PILONIDAL CYST EXCISION    . RIGHT HEART CATH N/A 10/19/2016   Procedure: Right Heart Cath;  Surgeon: [name], MD;  Location: [place] [facility] [place] [facility];  Service: Cardiovascular;  Laterality: N/A;  . SPINAL CORD STIMULATOR IMPLANT  12/2011  . SURGERY SCROTAL / TESTICULAR      Social History   Socioeconomic History  . Marital status: Married    Spouse name: Not on file  . Number of children: Not on file  . Years of education: Not on file  . Highest education level: Not on file  Occupational History  . Occupation: Retired- Nurse, mental health  Social Needs  . Financial resource strain: Not on file  . Food insecurity:     Worry: Not on file    Inability: Not on file  . Transportation needs:    Medical: Not on file    Non-medical: Not on file  Tobacco Use  . Smoking status: Former Smoker    Packs/day: 3.00    Years: 39.00    Pack years: 117.00    Types: Cigarettes    Start date: 05/24/1956    Last attempt to quit: 03/22/1995    Years since quitting: 22.3  . Smokeless tobacco: Never Used  Substance and Sexual Activity  . Alcohol use: Yes    Alcohol/week: 1.2 oz    Types: 2 Cans of beer per week  . Drug use: No  . Sexual activity: Never  Lifestyle  . Physical activity:    Days per week: Not on file    Minutes per session: Not on file  . Stress: Not on file  Relationships  . Social connections:    Talks on phone: Not on file    Gets together: Not on file    Attends religious service: Not on file    Active member of club or organization: Not on file    Attends meetings of clubs or organizations: Not on file    Relationship status: Not on file  Other Topics Concern  . Not on file  Social History Narrative  . Not on file    Family History  Problem Relation Age of Onset  . Hypertension Mother   .  Stroke Mother   . Diabetes Father   . Coronary artery disease Father   . Other Father        DVT  . Diabetes Brother   . Colon cancer Neg Hx   . Heart attack Neg Hx     Review of Systems  Constitutional: Positive for fatigue. Negative for chills and fever.  Respiratory: Positive for shortness of breath (chornic - no change). Negative for cough and wheezing.   Cardiovascular: Positive for palpitations and leg swelling (feet - mild). Negative for chest pain.  Neurological: Negative for light-headedness and headaches.       Objective:   Vitals:   08/02/17 1118  BP: 114/74  Pulse: (!) 47  Resp: 16  Temp: (!) 97.5 F (36.4 C)  SpO2: 95%   BP Readings from Last 3 Encounters:  08/02/17 114/74  07/20/17 138/69  07/17/17 132/72   Wt Readings from Last 3 Encounters:  08/02/17 242 lb  (109.8 kg)  07/20/17 235 lb (106.6 kg)  07/17/17 247 lb (112 kg)   Body mass index is 32.82 kg/m.   Physical Exam    Constitutional: Appears well-developed and well-nourished. No distress.  HENT:  Head: Normocephalic and atraumatic.  Neck: Neck supple. No tracheal deviation present. No thyromegaly present.  No cervical lymphadenopathy Cardiovascular: Normal rate, regular rhythm and normal heart sounds.   No murmur heard. No carotid bruit .  No edema Pulmonary/Chest: Effort normal and breath sounds normal. No respiratory distress. No has no wheezes. No rales.  Skin: Skin is warm and dry. Not diaphoretic.  Psychiatric: Normal mood and affect. Behavior is normal.      Assessment & Plan:    See Problem List for Assessment and Plan of chronic medical problems.

## 2017-08-01 NOTE — Patient Instructions (Addendum)
  Medications reviewed and updated.  No changes recommended at this time.     Please followup in 6 months   

## 2017-08-02 ENCOUNTER — Encounter: Payer: Self-pay | Admitting: Internal Medicine

## 2017-08-02 ENCOUNTER — Ambulatory Visit (INDEPENDENT_AMBULATORY_CARE_PROVIDER_SITE_OTHER): Payer: Medicare Other | Admitting: *Deleted

## 2017-08-02 ENCOUNTER — Ambulatory Visit: Payer: Medicare Other | Admitting: Internal Medicine

## 2017-08-02 VITALS — BP 114/74 | HR 47 | Temp 97.5°F | Resp 16 | Ht 72.0 in | Wt 242.0 lb

## 2017-08-02 DIAGNOSIS — E1159 Type 2 diabetes mellitus with other circulatory complications: Secondary | ICD-10-CM

## 2017-08-02 DIAGNOSIS — I5022 Chronic systolic (congestive) heart failure: Secondary | ICD-10-CM | POA: Diagnosis not present

## 2017-08-02 DIAGNOSIS — E782 Mixed hyperlipidemia: Secondary | ICD-10-CM

## 2017-08-02 DIAGNOSIS — I10 Essential (primary) hypertension: Secondary | ICD-10-CM | POA: Diagnosis not present

## 2017-08-02 DIAGNOSIS — I255 Ischemic cardiomyopathy: Secondary | ICD-10-CM

## 2017-08-02 DIAGNOSIS — N183 Chronic kidney disease, stage 3 unspecified: Secondary | ICD-10-CM

## 2017-08-02 DIAGNOSIS — Z794 Long term (current) use of insulin: Secondary | ICD-10-CM

## 2017-08-02 DIAGNOSIS — E1142 Type 2 diabetes mellitus with diabetic polyneuropathy: Secondary | ICD-10-CM | POA: Diagnosis not present

## 2017-08-02 MED ORDER — ATORVASTATIN CALCIUM 10 MG PO TABS: 10.0000 mg | ORAL_TABLET | Freq: Every day | ORAL | 3 refills | Status: DC

## 2017-08-02 MED ORDER — TRAMADOL HCL 50 MG PO TABS: ORAL_TABLET | ORAL | 1 refills | Status: DC

## 2017-08-02 MED ORDER — METHOCARBAMOL 750 MG PO TABS: 750.0000 mg | ORAL_TABLET | Freq: Two times a day (BID) | ORAL | 1 refills | Status: AC | PRN

## 2017-08-02 NOTE — Assessment & Plan Note (Signed)
Has bilateral lower extremity, which is chronic No shortness of breath Monitored closely by cardiology and cardioMems-he will get called if he is fluid overloaded so we can adjust his diuretics.

## 2017-08-02 NOTE — Assessment & Plan Note (Signed)
Well-controlled Management per Dr. Dwyane Dee

## 2017-08-02 NOTE — Assessment & Plan Note (Signed)
Continue gabapentin Advised him to try taking the Cymbalta daily to see if that is more effective Also trying hemp

## 2017-08-02 NOTE — Assessment & Plan Note (Signed)
BP well controlled Current regimen effective and well tolerated Continue current medications at current doses  

## 2017-08-02 NOTE — Progress Notes (Signed)
Remote ICD transmission.   

## 2017-08-02 NOTE — Assessment & Plan Note (Signed)
Overall stable No change in medications

## 2017-08-02 NOTE — Assessment & Plan Note (Signed)
Continue statin. 

## 2017-08-03 LAB — CUP PACEART REMOTE DEVICE CHECK
Brady Statistic AP VP Percent: 0 %
Brady Statistic AP VS Percent: 0 %
Brady Statistic AS VS Percent: 55.03 %
Brady Statistic RV Percent Paced: 48.67 %
Date Time Interrogation Session: 20190515083824
HIGH POWER IMPEDANCE MEASURED VALUE: 64 Ohm
Implantable Lead Implant Date: 20090121
Implantable Lead Implant Date: 20090121
Implantable Lead Location: 753858
Implantable Lead Location: 753859
Implantable Lead Model: 4193
Implantable Lead Model: 6935
Lead Channel Impedance Value: 399 Ohm
Lead Channel Impedance Value: 4047 Ohm
Lead Channel Impedance Value: 437 Ohm
Lead Channel Impedance Value: 494 Ohm
Lead Channel Pacing Threshold Amplitude: 0.625 V
Lead Channel Sensing Intrinsic Amplitude: 3.75 mV
Lead Channel Setting Sensing Sensitivity: 0.3 mV
MDC IDC LEAD IMPLANT DT: 20090121
MDC IDC LEAD LOCATION: 753860
MDC IDC MSMT BATTERY REMAINING LONGEVITY: 41 mo
MDC IDC MSMT BATTERY VOLTAGE: 2.97 V
MDC IDC MSMT LEADCHNL LV IMPEDANCE VALUE: 4047 Ohm
MDC IDC MSMT LEADCHNL RA SENSING INTR AMPL: 3.75 mV
MDC IDC MSMT LEADCHNL RV IMPEDANCE VALUE: 380 Ohm
MDC IDC MSMT LEADCHNL RV PACING THRESHOLD PULSEWIDTH: 0.4 ms
MDC IDC MSMT LEADCHNL RV SENSING INTR AMPL: 9.25 mV
MDC IDC MSMT LEADCHNL RV SENSING INTR AMPL: 9.25 mV
MDC IDC PG IMPLANT DT: 20141124
MDC IDC SET LEADCHNL RV PACING AMPLITUDE: 2.5 V
MDC IDC SET LEADCHNL RV PACING PULSEWIDTH: 0.4 ms
MDC IDC STAT BRADY AS VP PERCENT: 44.97 %
MDC IDC STAT BRADY RA PERCENT PACED: 0 %

## 2017-08-04 ENCOUNTER — Encounter: Payer: Self-pay | Admitting: Cardiology

## 2017-08-04 ENCOUNTER — Telehealth: Payer: Self-pay | Admitting: Emergency Medicine

## 2017-08-04 NOTE — Telephone Encounter (Signed)
Called pharmacy to let them know methocarbamol has been approved.

## 2017-08-04 NOTE — Telephone Encounter (Signed)
PA completed, KEY [id number]. Awaiting Response.

## 2017-08-08 ENCOUNTER — Inpatient Hospital Stay: Payer: Medicare Other | Attending: Hematology & Oncology

## 2017-08-08 ENCOUNTER — Telehealth: Payer: Self-pay | Admitting: Pharmacist

## 2017-08-08 ENCOUNTER — Encounter: Payer: Self-pay | Admitting: Family

## 2017-08-08 ENCOUNTER — Telehealth (HOSPITAL_COMMUNITY): Payer: Self-pay | Admitting: *Deleted

## 2017-08-08 ENCOUNTER — Inpatient Hospital Stay: Payer: Medicare Other

## 2017-08-08 ENCOUNTER — Inpatient Hospital Stay (HOSPITAL_BASED_OUTPATIENT_CLINIC_OR_DEPARTMENT_OTHER): Payer: Medicare Other | Admitting: Family

## 2017-08-08 ENCOUNTER — Other Ambulatory Visit: Payer: Self-pay

## 2017-08-08 ENCOUNTER — Encounter: Payer: Medicare Other | Admitting: *Deleted

## 2017-08-08 VITALS — BP 135/72 | HR 51 | Wt 239.4 lb

## 2017-08-08 VITALS — BP 125/59 | HR 51 | Temp 97.0°F | Wt 241.0 lb

## 2017-08-08 DIAGNOSIS — I482 Chronic atrial fibrillation: Secondary | ICD-10-CM | POA: Insufficient documentation

## 2017-08-08 DIAGNOSIS — Z79899 Other long term (current) drug therapy: Secondary | ICD-10-CM

## 2017-08-08 DIAGNOSIS — Z7901 Long term (current) use of anticoagulants: Secondary | ICD-10-CM

## 2017-08-08 DIAGNOSIS — D45 Polycythemia vera: Secondary | ICD-10-CM

## 2017-08-08 LAB — CMP (CANCER CENTER ONLY)
ALK PHOS: 75 U/L (ref 40–150)
ALT: 9 U/L (ref 0–55)
AST: 13 U/L (ref 5–34)
Albumin: 3.7 g/dL (ref 3.5–5.0)
Anion gap: 9 (ref 3–11)
BILIRUBIN TOTAL: 0.6 mg/dL (ref 0.2–1.2)
BUN: 27 mg/dL — AB (ref 7–26)
CALCIUM: 9.1 mg/dL (ref 8.4–10.4)
CO2: 26 mmol/L (ref 22–29)
CREATININE: 1.71 mg/dL — AB (ref 0.70–1.30)
Chloride: 105 mmol/L (ref 98–109)
GFR, EST NON AFRICAN AMERICAN: 37 mL/min — AB (ref >60)
GFR, Est AFR Am: 43 mL/min — ABNORMAL LOW (ref >60)
Glucose, Bld: 138 mg/dL (ref 70–140)
POTASSIUM: 4.1 mmol/L (ref 3.5–5.1)
Sodium: 140 mmol/L (ref 136–145)
TOTAL PROTEIN: 6.5 g/dL (ref 6.4–8.3)

## 2017-08-08 LAB — CBC WITH DIFFERENTIAL (CANCER CENTER ONLY)
BASOS ABS: 0 10*3/uL (ref 0.0–0.1)
Basophils Relative: 1 %
Eosinophils Absolute: 0.1 10*3/uL (ref 0.0–0.5)
Eosinophils Relative: 1 %
HCT: 42.3 % (ref 38.7–49.9)
Hemoglobin: 13.8 g/dL (ref 13.0–17.1)
Lymphocytes Relative: 17 %
Lymphs Abs: 1 10*3/uL (ref 0.9–3.3)
MCH: 28.9 pg (ref 28.0–33.4)
MCHC: 32.6 g/dL (ref 32.0–35.9)
MCV: 88.7 fL (ref 82.0–98.0)
MONO ABS: 0.6 10*3/uL (ref 0.1–0.9)
Monocytes Relative: 10 %
Neutro Abs: 4.5 10*3/uL (ref 1.5–6.5)
Neutrophils Relative %: 71 %
Platelet Count: 156 10*3/uL (ref 145–400)
RBC: 4.77 MIL/uL (ref 4.20–5.70)
RDW: 15.9 % — AB (ref 11.1–15.7)
WBC Count: 6.2 10*3/uL (ref 4.0–10.0)

## 2017-08-08 NOTE — Telephone Encounter (Signed)
Patient with diagnosis of Afib with history of DVT and atrial thrombus on warfarin for anticoagulation.    Procedure: caudal injection Date of procedure: 08/25/17  CHADS2-VASc score of  6 (CHF, HTN, AGE, DM2, stroke/tia x 2, CAD, AGE, male)  Pt has not been bridge with lovenox in the past despite DVT and atrial thrombus. There is note from Dr. Aundra Dubin oking hold without bridge from 07/18/17.

## 2017-08-08 NOTE — Progress Notes (Signed)
RESEARCH ENCOUNTER  Patient ID: [name]  DOB: 01-21-1941  [name] presented to the [facility] for Visit 7 of the [place] Trial.  No signs/symptoms of ACS, dizziness, or syncopesince the last visit. In addition, no nitrate use or cardiac procedures since the last visit.Subject compliant with IP, IP returned, and additional IP dispensed.  All study procedures completed without event.  Discussed study closeout and final visit with subject and wife.  Scheduled final visit for June 19th.  Aware of 14 day phone call post final visit.  Study IP will be stopped at final visit.  Subject and wife verbalized understanding.

## 2017-08-08 NOTE — Progress Notes (Signed)
Hematology and Oncology Follow Up Visit  [name] [id number] Mar 24, 1940 77 y.o. 08/08/2017   Principle Diagnosis:  Polycythemia vera  Current Therapy:   Phlebotomy to maintain hematocrit below 45% Coumadin for chronic atrial fibrillation   Interim History:  [name] is here today with his wife for follow-up. He is doing well and has complaints at this time. He denies any recent cardiac issues and was seen by cardiology today. He states that the [place] trial ends in another week.  He has mild SOB when he showers in the morning.  No fever, chills, n/v, cough, rash, dizziness, chest pain, palpitations, abdominal pain or changes in bowel or bladder habits.  He has occasional puffiness in his feet and ankles. The numbness and tingling in his fingertips and the right calf is unchanged.  He has maintained a good appetite and is staying well hydrated. Her weight is stable.  They are headed to the beach tomorrow.   ECOG Performance Status: 1 - Symptomatic but completely ambulatory  Medications:  Allergies as of 08/08/2017      Reactions   Avelox [moxifloxacin Hcl In Nacl] Swelling, Rash, Other (See Comments)   Patient became hypotensive after infusion started Because of a history of documented adverse serious drug reaction;Medi Alert bracelet  is recommended   Penicillins Anaphylaxis, Swelling, Other (See Comments)   REACTION: anaphylaxis Because of a history of documented adverse serious drug reaction;Medi Alert bracelet  is recommended Has patient had a PCN reaction causing immediate rash, facial/tongue/throat swelling, SOB or lightheadedness with hypotension: Yes Has patient had a PCN reaction causing severe rash involving mucus membranes or skin necrosis: No Has patient had a PCN reaction that required hospitalization: Unknown Has patient had a PCN reaction occurring within the last 10 years: No      Medication List        Accurate as of 08/08/17  1:34 PM. Always use  your most recent med list.          ACCU-CHEK AVIVA PLUS test strip Generic drug:  glucose blood USE 1 STRIP TO CHECK GLUCOSE THREE TIMES DAILY AS DIRECTED   ACCU-CHEK FASTCLIX LANCETS Misc USE ONE LANCET TO CHECK GLUCOSE THREE TIMES DAILY   acetaminophen 325 MG tablet Commonly known as:  TYLENOL Take 650-975 mg by mouth 4 (four) times daily as needed for moderate pain.   allopurinol 100 MG tablet Commonly known as:  ZYLOPRIM TAKE TWO TABLETS BY MOUTH ONCE DAILY   atorvastatin 10 MG tablet Commonly known as:  LIPITOR Take 1 tablet (10 mg total) by mouth daily.   carboxymethylcellulose 0.5 % Soln Commonly known as:  REFRESH PLUS Place 2 drops into both eyes daily.   carvedilol 12.5 MG tablet Commonly known as:  COREG Take 1.5 tablets (18.75 mg total) by mouth 2 (two) times daily with a meal.   cholecalciferol 1000 units tablet Commonly known as:  VITAMIN D Take 1,000 Units by mouth daily.   colchicine 0.6 MG tablet Take 0.6 mg by mouth daily as needed (gout flares).   digoxin 0.125 MG tablet Commonly known as:  LANOXIN Take 0.5 tablets (0.0625 mg total) by mouth daily.   DULoxetine 30 MG capsule Commonly known as:  CYMBALTA Take 1 capsule (30 mg total) by mouth daily.   gabapentin 300 MG capsule Commonly known as:  NEURONTIN Take 2 capsules (600 mg total) by mouth 3 (three) times daily. TAKE ONE CAPSULE BY MOUTH 5 TIMES DAILY AS NEEDED FOR LEG PAIN   Investigational -  Study Medication Take 1 tablet by mouth daily. Study name: [place] Additional study details: MK-1242 (vericiguat) or Placebo   ipratropium 0.06 % nasal spray Commonly known as:  ATROVENT Place 2 sprays into both nostrils daily.   lidocaine 5 % Commonly known as:  LIDODERM Place 1 patch onto the skin daily as needed (for pain.).   methocarbamol 750 MG tablet Commonly known as:  ROBAXIN Take 1 tablet (750 mg total) by mouth 2 (two) times daily as needed for muscle spasms.   metroNIDAZOLE  0.75 % gel Commonly known as:  METROGEL Apply 1 application topically daily as needed. FOR ROSACEA   nitroGLYCERIN 0.4 MG SL tablet Commonly known as:  NITROSTAT Place 1 tablet (0.4 mg total) under the tongue every 5 (five) minutes as needed for chest pain (MAX 3 TABLETS).   NOVOLIN N 100 UNIT/ML injection Generic drug:  insulin NPH Human Inject 6-8 Units into the skin See admin instructions. Inject 8 units subcutaneously in the morning and inject 6 units subcutaneously in the evening.   NOVOLIN R 100 units/mL injection Generic drug:  insulin regular Inject 6 Units into the skin daily before supper.   omeprazole 20 MG capsule Commonly known as:  PRILOSEC Take 2 capsules (40 mg total) by mouth 2 (two) times daily.   potassium chloride SA 20 MEQ tablet Commonly known as:  K-DUR,KLOR-CON Take 1 tablet (20 mEq total) by mouth daily.   sacubitril-valsartan 24-26 MG Commonly known as:  ENTRESTO Take 1 tablet by mouth 2 (two) times daily.   torsemide 20 MG tablet Commonly known as:  DEMADEX Take 4 tablets (80 mg total) by mouth 2 (two) times daily.   traMADol 50 MG tablet Commonly known as:  ULTRAM TAKE TWO TABLETS BY MOUTH EVERY 8 HOURS AS NEEDED FOR PAIN.   warfarin 2.5 MG tablet Commonly known as:  COUMADIN Take as directed by the anticoagulation clinic. If you are unsure how to take this medication, talk to your nurse or doctor. Original instructions:  TAKE AS DIRECTED BY ADMIN INSTRUCTIONS       Allergies:  Allergies  Allergen Reactions  . Avelox [Moxifloxacin Hcl In Nacl] Swelling, Rash and Other (See Comments)    Patient became hypotensive after infusion started Because of a history of documented adverse serious drug reaction;Medi Alert bracelet  is recommended  . Penicillins Anaphylaxis, Swelling and Other (See Comments)    REACTION: anaphylaxis Because of a history of documented adverse serious drug reaction;Medi Alert bracelet  is recommended Has patient had a  PCN reaction causing immediate rash, facial/tongue/throat swelling, SOB or lightheadedness with hypotension: Yes Has patient had a PCN reaction causing severe rash involving mucus membranes or skin necrosis: No Has patient had a PCN reaction that required hospitalization: Unknown Has patient had a PCN reaction occurring within the last 10 years: No     Past Medical History, Surgical history, Social history, and Family History were reviewed and updated.  Review of Systems: All other 10 point review of systems is negative.   Physical Exam:  weight is 241 lb (109.3 kg). His oral temperature is 97 F (36.1 C) (abnormal). His blood pressure is 125/59 (abnormal) and his pulse is 51 (abnormal). His oxygen saturation is 97%.   Wt Readings from Last 3 Encounters:  08/08/17 241 lb (109.3 kg)  08/08/17 239 lb 6.4 oz (108.6 kg)  08/02/17 242 lb (109.8 kg)    Ocular: Sclerae unicteric, pupils equal, round and reactive to light Ear-nose-throat: Oropharynx clear, dentition fair Lymphatic:  No cervical, supraclavicular or axillary adenopathy Lungs no rales or rhonchi, good excursion bilaterally Heart regular rate and rhythm, no murmur appreciated Abd soft, nontender, positive bowel sounds, no liver or spleen tip palpated on exam, no fluid wave  MSK no focal spinal tenderness, no joint edema Neuro: non-focal, well-oriented, appropriate affect Breasts: Deferred   Lab Results  Component Value Date   WBC 6.2 08/08/2017   HGB 13.8 08/08/2017   HCT 42.3 08/08/2017   MCV 88.7 08/08/2017   PLT 156 08/08/2017   Lab Results  Component Value Date   FERRITIN 43 05/03/2017   IRON 117 05/03/2017   TIBC 380 05/03/2017   UIBC 263 05/03/2017   IRONPCTSAT 31 (L) 05/03/2017   Lab Results  Component Value Date   RETICCTPCT 1.4 04/02/2014   RBC 4.77 08/08/2017   RETICCTABS 75.2 04/02/2014   No results found for: KPAFRELGTCHN, LAMBDASER, KAPLAMBRATIO No results found for: Kandis Cocking, IGMSERUM No  results found for: Odetta Pink, SPEI   Chemistry      Component Value Date/Time   NA 142 06/20/2017 1138   NA 148 (H) 01/30/2017 1255   NA 141 03/09/2016 1320   K 3.6 06/20/2017 1138   K 4.3 01/30/2017 1255   K 4.5 03/09/2016 1320   CL 104 06/20/2017 1138   CL 101 01/30/2017 1255   CO2 27 06/20/2017 1138   CO2 28 01/30/2017 1255   CO2 25 03/09/2016 1320   BUN 27 (H) 06/20/2017 1138   BUN 43 (H) 01/30/2017 1255   BUN 28.8 (H) 03/09/2016 1320   CREATININE 1.45 (H) 06/20/2017 1138   CREATININE 1.40 (H) 05/03/2017 1308   CREATININE 2.1 (H) 01/30/2017 1255   CREATININE 1.9 (H) 03/09/2016 1320      Component Value Date/Time   CALCIUM 8.9 06/20/2017 1138   CALCIUM 9.4 01/30/2017 1255   CALCIUM 9.2 03/09/2016 1320   ALKPHOS 73 05/03/2017 1308   ALKPHOS 68 01/30/2017 1255   ALKPHOS 97 03/09/2016 1320   AST 13 05/03/2017 1308   AST 13 03/09/2016 1320   ALT 13 05/03/2017 1308   ALT 19 01/30/2017 1255   ALT 10 03/09/2016 1320   BILITOT 1.2 05/03/2017 1308   BILITOT 0.88 03/09/2016 1320      Impression and Plan: Mr. Abel is a very pleasant 77 yo caucasian gentleman with polycythemia. He continues to do well and has no complaints at this time.  His Hct is 42.3% so no phlebotomy needed at this time.  We will plan to see him back in another 3 months.  He will contact our office with any questions or concerns. We can certainly see him sooner if need be.   Laverna Peace, NP 5/21/20191:34 PM

## 2017-08-08 NOTE — Telephone Encounter (Signed)
Patient is waiting on [employer], unable to afford entresto and requesting for samples.  Samples provided.    Medication Samples have been provided to the patient.  Drug name: Delene Loll     Strength: 24-26mg        Qty:4  LOT: [id number]   Exp.Date: 06/21  Dosing instructions: Take 1 Tablet Twice daily  The patient has been instructed regarding the correct time, dose, and frequency of taking this medication, including desired effects and most common side effects.   Darron Doom 10:23 AM 08/08/2017

## 2017-08-09 LAB — FERRITIN: Ferritin: 29 ng/mL (ref 22–316)

## 2017-08-09 LAB — IRON AND TIBC
Iron: 59 ug/dL (ref 42–163)
SATURATION RATIOS: 15 % — AB (ref 42–163)
TIBC: 399 ug/dL (ref 202–409)
UIBC: 340 ug/dL

## 2017-08-10 ENCOUNTER — Telehealth (HOSPITAL_COMMUNITY): Payer: Self-pay | Admitting: *Deleted

## 2017-08-10 ENCOUNTER — Encounter: Payer: Medicare Other | Admitting: Cardiothoracic Surgery

## 2017-08-10 NOTE — Telephone Encounter (Signed)
Threshold: 15-20  Reading: 23  Instructions: per Darrick Grinder, NP pt needs to take an extra 20 mg of Torsemide for 2 days.    Spoke w/pt and his wife, they are aware and agreeable.

## 2017-08-17 ENCOUNTER — Other Ambulatory Visit: Payer: Self-pay | Admitting: Endocrinology

## 2017-08-21 ENCOUNTER — Telehealth (HOSPITAL_COMMUNITY): Payer: Self-pay | Admitting: *Deleted

## 2017-08-21 NOTE — Telephone Encounter (Signed)
Threshold: 17-20  Reading: 24  Instructions: per Darrick Grinder, NP take an extra 20 mg of Torsemide for 2 days.  Pt's wife is aware and agreeable, we again discussed salt, she states he has been eating good and having lots of salad, however he is using blue cheese dressing advised she needs to look at salt in that as it is probably pretty high, she will look into this.

## 2017-08-22 DIAGNOSIS — H353132 Nonexudative age-related macular degeneration, bilateral, intermediate dry stage: Secondary | ICD-10-CM | POA: Diagnosis not present

## 2017-08-22 DIAGNOSIS — H43811 Vitreous degeneration, right eye: Secondary | ICD-10-CM | POA: Diagnosis not present

## 2017-08-22 DIAGNOSIS — H33301 Unspecified retinal break, right eye: Secondary | ICD-10-CM | POA: Diagnosis not present

## 2017-08-24 ENCOUNTER — Other Ambulatory Visit: Payer: Self-pay

## 2017-08-24 ENCOUNTER — Institutional Professional Consult (permissible substitution): Payer: Medicare Other | Admitting: Cardiothoracic Surgery

## 2017-08-24 ENCOUNTER — Encounter: Payer: Self-pay | Admitting: Cardiothoracic Surgery

## 2017-08-24 ENCOUNTER — Telehealth: Payer: Self-pay | Admitting: Emergency Medicine

## 2017-08-24 VITALS — BP 128/64 | HR 62 | Resp 18 | Ht 72.0 in | Wt 233.0 lb

## 2017-08-24 DIAGNOSIS — I712 Thoracic aortic aneurysm, without rupture: Secondary | ICD-10-CM | POA: Diagnosis not present

## 2017-08-24 DIAGNOSIS — I7121 Aneurysm of the ascending aorta, without rupture: Secondary | ICD-10-CM

## 2017-08-24 MED ORDER — DULOXETINE HCL 30 MG PO CPEP: 30.0000 mg | ORAL_CAPSULE | Freq: Every day | ORAL | 1 refills | Status: DC

## 2017-08-24 NOTE — Progress Notes (Signed)
[address]       [place] [address]             [phone]                    [name] [place] Medical Record #[id number] Date of Birth: Nov 15, 1940  Referring: [name], MD Primary Care: [name], MD Primary Cardiologist: No primary care provider on file.  Chief Complaint:    Chief Complaint  Patient presents with  . Thoracic Aortic Aneurysm    new patient, Chest CT 07/24/2017, ECHO 07/04/2017    History of Present Illness:    [name] 77 y.o. male is seen in the office  today for for known dilated ascending aorta present at least since 2011 on CT scan.  The patient underwent coronary artery bypass grafting in 2005 by Dr. Mohammed Kindle, soon after that had an open abdominal aortic aneurysm repair by Dr. Amedeo Plenty.  He is currently severely limited by congestive heart failure symptoms.  His functional status is very limited both from congestive heart failure and chronic back pain.   A follow-up CT scan of the chest was done in May precipitating his referral to thoracic surgery.   Patient notes because of his fatigue shortness of breath back and leg pain he spends more than 50% of his day in the chair.  He has known chronic atrial fibrillation and chronic renal failure     CAD s/p CABG, permanent atrial fib, S/P AAA repair, ischemic cardiomyopathy/chronic systolic heart failure,  [employer] CRT-D system.   Admitted 4/14 through 07/06/16 with volume overload.   Current Activity/ Functional Status:  Patient is independent with mobility/ambulation, transfers, ADL's, IADL's.   Zubrod Score: At the time of surgery this patient's most appropriate activity status/level should be described as: []     0    Normal activity, no symptoms []     1    Restricted in physical strenuous activity but ambulatory, able to do out light work [x]     2    Ambulatory and capable of self care, unable to do work activities, up and about               >50 %  of waking hours                              []     3    Only limited self care, in bed greater than 50% of waking hours []     4    Completely disabled, no self care, confined to bed or chair []     5    Moribund   Past Medical History:  Diagnosis Date  . AAA (abdominal aortic aneurysm) ([place])    Surgical repair 11/2002.  . Adenomatous colon polyp   . Alcohol ingestion of more than four drinks per week    Excess beer  not a dependency problem  . Aortic valve sclerosis   . Arthritis   . Atrial fibrillation ([place])    Previous long-term amiodarone therapy with multiple cardioversions / amiodarone stopped September, 2009  . Atrial flutter [facility])    Started November, 2010, Left-sided and cannot ablate  . Bony abnormality    Patient's manubrium is slightly displaced to the right  . CAD (coronary artery disease)    a. s/p CABG 2004;  b. 02/2016 Cath: LM nl, LAD 70p, 137m,  D1 50, D2 50, RI 50ost, LCX 55m, OM2 100, OM3 100, RCA 70p, 100d, VG->OM3 ok, LIMA->LAD 60ost, VG->RPDA  ok.  . 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 01/2013 MDT DTBA 1D1 [place] CRT-D, ser # [id number].  . Carotid artery disease ([place])    Doppler, December, 2013, 0-39% bilateral  . Chronic systolic CHF (congestive heart failure) ([place])    a. 02/2016 Echo: EF 35-40%, diff HK, triv AI, mildly dil Ao root 103mm, mild MR, sev dil LA, triv TR.  Marland Kitchen Chronotropic incompetence    IV pacing rate adjusted  . CKD (chronic kidney disease), stage III ([place])   . COPD (chronic obstructive pulmonary disease) ([place])   . Dilated aortic root ([place])   . Discolored skin   . Diverticulosis   . Drug therapy    Redness and swelling with Avelox infusion May 24, 2011  . Eye abnormality    Ophthalmologist questions a clot in one of his eyes, May, 2012  . Gout   . Hyperlipidemia   . Hypertension   . Internal hemorrhoids   . Ischemic cardiomyopathy   . Left atrial thrombus    Remote past... cardioversions done since  that time  . Mitral regurgitation    Mild echo  . Myocardial infarction ([place])   . Nasal drainage    Chronic  . Overweight(278.02)   . Pericardial effusion   . Pleural effusion    Large loculated effusion on the left side November, 2011. This was tapped. It was exudative. Cytology revealed no cancer no proof of mesothelioma area pulmonary team felt that no further workup was needed  . Pleural thickening   . Pneumonia   . Polycythemia vera ([place]) 07/28/2014  . SOB (shortness of breath)    Large left effusion/ thoracentesis/hospitalization/November, 2011... exudated.. cytology negative.. Dr.Wert.. no proof of mesothelioma  . Spinal stenosis    Surgery Dr.Elsner  . Thrombophlebitis of superficial veins of upper extremities    Possible venous stenosis from defibrillator  . Type II diabetes mellitus ([place]) 2008  . Venous insufficiency    Toe discoloration chronic  . Ventral hernia    April, 2014, result of his abdominal surgery  . Warfarin anticoagulation   . Wide-complex tachycardia ([place])     Past Surgical History:  Procedure Laterality Date  . ABDOMINAL AORTIC ANEURYSM REPAIR  11/2002  . ANTERIOR CERVICAL DECOMP/DISCECTOMY FUSION  1995  . BACK SURGERY    . BI-VENTRICULAR PACEMAKER INSERTION (CRT-P)  02-11-2013   Pt with previously implanted MDT CRTD downgraded to CRTP by Dr Lovena Le 02-11-13  . CARDIAC CATHETERIZATION  "several"  . CARDIAC CATHETERIZATION N/A 03/18/2016   Procedure: RIGHT/LEFT HEART CATH AND CORONARY/GRAFT ANGIOGRAPHY;  Surgeon: Nelva Bush, MD;  Location: [facility];  Service: Cardiovascular;  Laterality: N/A;  . CATARACT EXTRACTION W/ INTRAOCULAR LENS  IMPLANT, BILATERAL    . COLONOSCOPY W/ POLYPECTOMY    . CORONARY ANGIOPLASTY WITH STENT PLACEMENT  2004  . CORONARY ARTERY BYPASS GRAFT  2004   CABG X4  . FRACTURE SURGERY    . IMPLANTABLE CARDIOVERTER DEFIBRILLATOR (ICD) GENERATOR CHANGE N/A 02/11/2013   Procedure: ICD GENERATOR CHANGE;  Surgeon: Evans Lance, MD;  Location: [facility] CATH LAB;  Service: Cardiovascular;  Laterality: N/A;  . INCISION AND DRAINAGE ABSCESS / HEMATOMA OF BURSA / KNEE / THIGH    . INSERT / REPLACE / REMOVE PACEMAKER  2009   original pacer/defibrillator; Dr. Lovena Le 2009... by the pacing  . LUMBAR  FUSION  2010  . ORIF TIBIA & FIBULA FRACTURES Right 2000s  . PILONIDAL CYST EXCISION    . RIGHT HEART CATH N/A 10/19/2016   Procedure: Right Heart Cath;  Surgeon: [name], MD;  Location: [place] [facility];  Service: Cardiovascular;  Laterality: N/A;  . SPINAL CORD STIMULATOR IMPLANT  12/2011  . SURGERY SCROTAL / TESTICULAR      Family History  Problem Relation Age of Onset  . Hypertension Mother   . Stroke Mother   . Diabetes Father   . Coronary artery disease Father   . Other Father        DVT  . Diabetes Brother   . Colon cancer Neg Hx   . Heart attack Neg Hx      Social History   Tobacco Use  Smoking Status Former Smoker  . Packs/day: 3.00  . Years: 39.00  . Pack years: 117.00  . Types: Cigarettes  . Start date: 05/24/1956  . Last attempt to quit: 03/22/1995  . Years since quitting: 22.4  Smokeless Tobacco Never Used    Social History   Substance and Sexual Activity  Alcohol Use Yes  . Alcohol/week: 1.2 oz  . Types: 2 Cans of beer per week     Allergies  Allergen Reactions  . Avelox [Moxifloxacin Hcl In Nacl] Swelling, Rash and Other (See Comments)    Patient became hypotensive after infusion started Because of a history of documented adverse serious drug reaction;Medi Alert bracelet  is recommended  . Penicillins Anaphylaxis, Swelling and Other (See Comments)    REACTION: anaphylaxis Because of a history of documented adverse serious drug reaction;Medi Alert bracelet  is recommended Has patient had a PCN reaction causing immediate rash, facial/tongue/throat swelling, SOB or lightheadedness with hypotension: Yes Has patient had a PCN reaction causing severe rash involving mucus  membranes or skin necrosis: No Has patient had a PCN reaction that required hospitalization: Unknown Has patient had a PCN reaction occurring within the last 10 years: No     Current Outpatient Medications  Medication Sig Dispense Refill  . ACCU-CHEK AVIVA PLUS test strip USE 1 STRIP TO CHECK GLUCOSE THREE TIMES DAILY AS DIRECTED 300 each 3  . ACCU-CHEK FASTCLIX LANCETS MISC USE ONE LANCET TO CHECK GLUCOSE THREE TIMES DAILY 102 each 5  . acetaminophen (TYLENOL) 325 MG tablet Take 650-975 mg by mouth 4 (four) times daily as needed for moderate pain.     Marland Kitchen allopurinol (ZYLOPRIM) 100 MG tablet TAKE TWO TABLETS BY MOUTH ONCE DAILY (Patient taking differently: TAKE TWO TABLETS BY MOUTH ONCE DAILY IN THE EVENING.) 180 tablet 3  . atorvastatin (LIPITOR) 10 MG tablet Take 1 tablet (10 mg total) by mouth daily. 90 tablet 3  . carboxymethylcellulose (REFRESH PLUS) 0.5 % SOLN Place 2 drops into both eyes daily.     . carvedilol (COREG) 12.5 MG tablet Take 1.5 tablets (18.75 mg total) by mouth 2 (two) times daily with a meal. 90 tablet 6  . cholecalciferol (VITAMIN D) 1000 units tablet Take 1,000 Units by mouth daily.    . colchicine 0.6 MG tablet Take 0.6 mg by mouth daily as needed (gout flares).     Marland Kitchen digoxin (LANOXIN) 0.125 MG tablet Take 0.5 tablets (0.0625 mg total) by mouth daily. 15 tablet 6  . DULoxetine (CYMBALTA) 30 MG capsule Take 1 capsule (30 mg total) by mouth daily. 90 capsule 1  . gabapentin (NEURONTIN) 300 MG capsule Take 2 capsules (600 mg total) by mouth  3 (three) times daily. TAKE ONE CAPSULE BY MOUTH 5 TIMES DAILY AS NEEDED FOR LEG PAIN (Patient taking differently: Take 300-600 mg by mouth See admin instructions. TAKE 2 CAPSULES (600 MG) IN THE MORNING, 2 CAPSULES (600 MG) IN THE EVENING, & 1 CAPSULE (300 MG) BY MOUTH AT BEDTIME.) 540 capsule 3  . insulin NPH Human (NOVOLIN N) 100 UNIT/ML injection Inject 6-8 Units into the skin See admin instructions. Inject 8 units subcutaneously in  the morning and inject 6 units subcutaneously in the evening.    . insulin regular (NOVOLIN R) 100 units/mL injection Inject 6 Units into the skin daily before supper.     . Investigational - Study Medication Take 1 tablet by mouth daily. Study name: [place] Additional study details: [id number] (vericiguat) or Placebo 1 each PRN  . ipratropium (ATROVENT) 0.06 % nasal spray Place 2 sprays into both nostrils daily.     Marland Kitchen lidocaine (LIDODERM) 5 % Place 1 patch onto the skin daily as needed (for pain.).     Marland Kitchen methocarbamol (ROBAXIN) 750 MG tablet Take 1 tablet (750 mg total) by mouth 2 (two) times daily as needed for muscle spasms. 90 tablet 1  . metroNIDAZOLE (METROGEL) 0.75 % gel Apply 1 application topically daily as needed. FOR ROSACEA  2  . nitroGLYCERIN (NITROSTAT) 0.4 MG SL tablet Place 1 tablet (0.4 mg total) under the tongue every 5 (five) minutes as needed for chest pain (MAX 3 TABLETS). 25 tablet 3  . omeprazole (PRILOSEC) 20 MG capsule Take 2 capsules (40 mg total) by mouth 2 (two) times daily. 120 capsule 11  . potassium chloride SA (K-DUR,KLOR-CON) 20 MEQ tablet Take 1 tablet (20 mEq total) by mouth daily. 90 tablet 3  . sacubitril-valsartan (ENTRESTO) 24-26 MG Take 1 tablet by mouth 2 (two) times daily. 60 tablet 11  . torsemide (DEMADEX) 20 MG tablet Take 4 tablets (80 mg total) by mouth 2 (two) times daily.    . traMADol (ULTRAM) 50 MG tablet TAKE TWO TABLETS BY MOUTH EVERY 8 HOURS AS NEEDED FOR PAIN. 180 tablet 1  . warfarin (COUMADIN) 2.5 MG tablet TAKE AS DIRECTED BY ADMIN INSTRUCTIONS (Patient taking differently: TAKE 2 TABLETS (5 MG) BY MOUTH ON TUESDAYS AND SATURDAYS, THEN TAKE 1 TABLET (2.5 MG) BY MOUTH ON ALL OTHER DAYS.) 120 tablet 1   No current facility-administered medications for this visit.       Review of Systems:     Cardiac Review of Systems: [Y] = yes  or   [ N ] = no   Chest Pain [   n ]  Resting SOB [ y  ] Exertional SOB  Blue.Reese  ]  Orthopnea [  y]   Pedal Edema [   y ]    Palpitations [ y ] Syncope  [n  ]   Presyncope [ n  ]   General Review of Systems: [Y] = yes [  ]=no Constitional: recent weight change Blue.Reese  ];  Wt loss over the last 3 months [   ] anorexia [  ]; fatigue [  ]; nausea [  ]; night sweats [  ]; fever [  ]; or chills [  ];           Eye : blurred vision [  ]; diplopia [   ]; vision changes [  ];  Amaurosis fugax[  ]; Resp: cough [  ];  wheezing[  ];  hemoptysis[  ]; shortness of breath[  ]; paroxysmal  nocturnal dyspnea[  ]; dyspnea on exertion[  ]; or orthopnea[  ];  GI:  gallstones[  ], vomiting[  ];  dysphagia[  ]; melena[  ];  hematochezia [  ]; heartburn[  ];   Hx of  Colonoscopy[  ]; GU: kidney stones [  ]; hematuria[  ];   dysuria [  ];  nocturia[  ];  history of     obstruction [  ]; urinary frequency [  ]             Skin: rash, swelling[  ];, hair loss[  ];  peripheral edema[  ];  or itching[  ];  Musculosketetal: myalgias[  ];  joint swelling[  ];  joint erythema[  ];  joint pain[  ];  back pain[  ];  Heme/Lymph: bruising[  ];  bleeding[  ];  anemia[  ];  Neuro: TIA[  ];  headaches[  ];  stroke[  ];  vertigo[  ];  seizures[  ];   paresthesias[  ];  difficulty walking[y  ];  Psych:depression[  ]; anxiety[  ];  Endocrine: diabetes[  ];  thyroid dysfunction[  ];  Immunizations: Flu up to date [  ]; Pneumococcal up to date [  ];  Other:  PHYSICAL EXAMINATION: BP 128/64 (BP Location: Left Arm, Patient Position: Sitting, Cuff Size: Normal)   Pulse 62   Resp 18   Ht 6' (1.829 m)   Wt 233 lb (105.7 kg)   SpO2 92% Comment: RA  BMI 31.60 kg/m  General appearance: alert, cooperative, appears older than stated age and no distress Head: Normocephalic, without obvious abnormality, atraumatic Neck: no adenopathy, no carotid bruit, no JVD, supple, symmetrical, trachea midline and thyroid not enlarged, symmetric, no tenderness/mass/nodules Lymph nodes: Cervical, supraclavicular, and axillary nodes normal. Resp: diminished breath sounds  bibasilar Back: symmetric, no curvature. ROM normal. No CVA tenderness. Cardio: irregularly irregular rhythm GI: soft, non-tender; bowel sounds normal; no masses,  no organomegaly Extremities: extremities normal, atraumatic, no cyanosis or edema and Homans sign is negative, no sign of DVT Neurologic: Grossly normal  Diagnostic Studies & Laboratory data:     Recent Radiology Findings:   I have independently reviewed the above radiology studies  and reviewed the findings with the patient.   Recent Lab Findings: Lab Results  Component Value Date   WBC 6.2 08/08/2017   HGB 13.8 08/08/2017   HCT 42.3 08/08/2017   PLT 156 08/08/2017   GLUCOSE 138 08/08/2017   CHOL 119 07/03/2017   TRIG 127.0 07/03/2017   HDL 41.60 07/03/2017   LDLCALC 52 07/03/2017   ALT 9 08/08/2017   AST 13 08/08/2017   NA 140 08/08/2017   K 4.1 08/08/2017   CL 105 08/08/2017   CREATININE 1.71 (H) 08/08/2017   BUN 27 (H) 08/08/2017   CO2 26 08/08/2017   TSH 0.59 10/07/2014   INR 1.7 07/28/2017   HGBA1C 7.0 (H) 07/03/2017     Diagnostic Studies & Laboratory data:     Recent Radiology Findings:  CLINICAL DATA:  Follow-up thoracic aortic aneurysm. Remote smoking history.  EXAM: CT CHEST WITHOUT CONTRAST  TECHNIQUE: Multidetector CT imaging of the chest was performed following the standard protocol without IV contrast.  COMPARISON:  07/02/2016 chest radiograph.  05/30/2016 chest CT.  FINDINGS: Cardiovascular: Stable moderate cardiomegaly. Stable configuration of 3 lead right subclavian ICD with lead tips in the right atrium, right ventricle and coronary sinus. No significant pericardial fluid/thickening. Left main and 3 vessel coronary atherosclerosis status  post CABG. Atherosclerotic thoracic aorta with 5.2 cm ascending thoracic aortic aneurysm, previously 5.1 cm using similar measurement technique, minimally increased. Stable dilated main pulmonary artery (4.9 cm  diameter).  Mediastinum/Nodes: No discrete thyroid nodules. Unremarkable esophagus. No pathologically enlarged axillary, mediastinal or gross hilar lymph nodes, noting limited sensitivity for the detection of hilar adenopathy on this noncontrast study.  Lungs/Pleura: No pneumothorax. Small dependent bilateral pleural effusions, right greater than left, slightly increased bilaterally. Stable calcified bilateral pleural plaques and smooth pleural thickening. Mild centrilobular and paraseptal emphysema. Stable masslike consolidation in the left lower lobe measuring 5.0 x 2.8 cm (series 5/image 82), associated with volume loss, distortion and surrounding parenchymal banding, and abutting the left pleural effusion, compatible with rounded atelectasis. No acute consolidative airspace disease or significant pulmonary nodules. Patchy subpleural reticulation and ground-glass attenuation in both lungs with a dependent basilar predominance, not appreciably changed. No significant regions of traction bronchiectasis or frank honeycombing.  Upper abdomen: Stable granulomatous calcifications scattered in the liver. Liver surface appears finely irregular, cannot exclude cirrhosis. Stable 1.7 cm and 1.9 cm left adrenal adenomas. Cholelithiasis.  Musculoskeletal: No aggressive appearing focal osseous lesions. Left back spinal stimulator demonstrates leads terminating in the posterior mid to lower thoracic spinal canal. Moderate symmetric gynecomastia, increased bilaterally. Intact sternotomy wires. Marked thoracic spondylosis. Partially visualized bilateral posterior spinal fusion hardware extending inferiorly from the L2 level.  IMPRESSION: 1. Ascending thoracic aortic 5.2 cm aneurysm, minimally increased. Ascending thoracic aortic aneurysm. Recommend semi-annual imaging followup by CTA or MRA and referral to cardiothoracic surgery if not already obtained. This recommendation follows  2010 ACCF/AHA/AATS/ACR/ASA/SCA/SCAI/SIR/STS/SVM Guidelines for the Diagnosis and Management of Patients With Thoracic Aortic Disease. Circulation. 2010; 121: P950-D326. 2. Bilateral calcified pleural plaques are stable. Small dependent bilateral pleural effusions, right greater than left, slightly increased. 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 3. No appreciable change in nonspecific dependent basilar predominant patchy subpleural reticulation and ground-glass attenuation in the lungs, which could indicate mild asbestosis. 4. Stable cardiomegaly and prominently dilated main pulmonary artery, suggesting chronic pulmonary arterial hypertension. 5. Stable rounded atelectasis in the left lower lobe. 6. Finely irregular liver surface, cannot exclude cirrhosis. Increased moderate symmetric gynecomastia. 7. Cholelithiasis.  Aortic Atherosclerosis (ICD10-I70.0) and Emphysema (ICD10-J43.9).   Electronically Signed   By: Ilona Sorrel M.D.   On: 07/25/2017 10:40  I have independently reviewed the above radiology studies  and reviewed the findings with the patient.   ECHO:  *[facility]*                         [address], [place] [address]                            [phone]  ------------------------------------------------------------------- Transthoracic Echocardiography  Patient:    Muscab, Brenneman MR #:       [id number] Study Date: 07/04/2017 Gender:     M Age:        77 Height:     182.9 cm Weight:     109.3 kg BSA:        2.39 m^2 Pt. Status: Room:   ATTENDING    Kirk Ruths  Aundra Dubin, M.D.  ORDERING     Loralie Champagne, M.D.  REFERRING    Loralie Champagne, M.D.  PERFORMING   [facility], Outpatient  SONOGRAPHER  [facility]  REFERRING    Billey Gosling J  cc:  ------------------------------------------------------------------- LV EF: 40% -    45%  ------------------------------------------------------------------- Indications:      CHF - 428.0.  ------------------------------------------------------------------- History:   PMH:   Atrial fibrillation.  Chronic obstructive pulmonary disease.  PMH:   Myocardial infarction.  Risk factors: Hypertension. Diabetes mellitus.  ------------------------------------------------------------------- Study Conclusions  - Left ventricle: Systolic function was mildly to moderately   reduced. The estimated ejection fraction was in the range of 40%   to 45%. Diffuse hypokinesis. The study is not technically   sufficient to allow evaluation of LV diastolic function. - Mitral valve: There was mild regurgitation. - Left atrium: The atrium was severely dilated. - Atrial septum: No defect or patent foramen ovale was identified.  ------------------------------------------------------------------- Labs, prior tests, procedures, and surgery: Permanent pacemaker system implantation.  Coronary artery bypass grafting.  ------------------------------------------------------------------- Study data:  Comparison was made to the study of 03/16/2016.  Study status:  Routine.  Procedure:  The patient reported no pain pre or post test. Transthoracic echocardiography. Image quality was adequate.  Study completion:  There were no complications. Transthoracic echocardiography.  M-mode, complete 2D, spectral Doppler, and color Doppler.  Birthdate:  Patient birthdate: [birth year].  Age:  Patient is 77 yr old.  Sex:  Gender: male. BMI: 32.7 kg/m^2.  Blood pressure:     129/67  Patient status: Outpatient.  Study date:  Study date: 07/04/2017. Study time: 01:15 PM.  Location:  Echo laboratory.  -------------------------------------------------------------------  ------------------------------------------------------------------- Left ventricle:   Wall thickness was normal.   Systolic function was  mildly to moderately reduced. The estimated ejection fraction was in the range of 40% to 45%. Diffuse hypokinesis. The study is not technically sufficient to allow evaluation of LV diastolic function.  ------------------------------------------------------------------- Aortic valve:   Trileaflet; mildly thickened leaflets.  Doppler: There was no stenosis.  ------------------------------------------------------------------- Aorta:  The aorta was normal, not dilated, and non-diseased.  ------------------------------------------------------------------- Mitral valve:   Doppler:  There was mild regurgitation.    Peak gradient (D): 7 mm Hg.  ------------------------------------------------------------------- Left atrium:  The atrium was severely dilated.  ------------------------------------------------------------------- Atrial septum:  No defect or patent foramen ovale was identified.   ------------------------------------------------------------------- Pulmonic valve:    Doppler:  There was mild regurgitation.  ------------------------------------------------------------------- Tricuspid valve:   Doppler:  There was mild regurgitation.  ------------------------------------------------------------------- Right atrium:  The atrium was normal in size.  ------------------------------------------------------------------- Systemic veins: Inferior vena cava: The vessel was normal in size. The respirophasic diameter changes were in the normal range (>= 50%), consistent with normal central venous pressure.  ------------------------------------------------------------------- Post procedure conclusions Ascending Aorta:  - The aorta was normal, not dilated, and non-diseased.  ------------------------------------------------------------------- Measurements   Left ventricle                             Value        Reference  LV ID, ED, PLAX chordal            (H)     72.2   mm     43 - 52  LV ID, ES, PLAX chordal            (H)     58.5  mm     23 - 38  LV  fx shortening, PLAX chordal     (L)     19    %      >=29  LV PW thickness, ED                        12.8  mm     ----------  IVS/LV PW ratio, ED                        0.82         <=1.3  Stroke volume, 2D                          82    ml     ----------  Stroke volume/bsa, 2D                      34    ml/m^2 ----------  LV ejection fraction, 1-p A4C              46    %      ----------  LV end-diastolic volume, 2-p               115   ml     ----------  LV end-systolic volume, 2-p                60    ml     ----------  LV ejection fraction, 2-p                  48    %      ----------  Stroke volume, 2-p                         56    ml     ----------  LV end-diastolic volume/bsa, 2-p           48    ml/m^2 ----------  LV end-systolic volume/bsa, 2-p            25    ml/m^2 ----------  Stroke volume/bsa, 2-p                     23.2  ml/m^2 ----------  LV e&', lateral                             7.29  cm/s   ----------  LV E/e&', lateral                           18.24        ----------  LV e&', medial                              6.42  cm/s   ----------  LV E/e&', medial                            20.72        ----------  LV e&', average                             6.86  cm/s   ----------  LV E/e&', average  19.4         ----------    Ventricular septum                         Value        Reference  IVS thickness, ED                          10.5  mm     ----------    LVOT                                       Value        Reference  LVOT ID, S                                 25    mm     ----------  LVOT area                                  4.91  cm^2   ----------  LVOT peak velocity, S                      81.9  cm/s   ----------  LVOT mean velocity, S                      54.8  cm/s   ----------  LVOT VTI, S                                16.8  cm     ----------  LVOT  peak gradient, S                      3     mm Hg  ----------    Aorta                                      Value        Reference  Aortic root ID, ED                         41    mm     ----------    Left atrium                                Value        Reference  LA ID, A-P, ES                             61.54 mm     ----------  LA ID/bsa, A-P                     (H)     2.58  cm/m^2 <=2.2  LA volume, S  210   ml     ----------  LA volume/bsa, S                           87.9  ml/m^2 ----------  LA volume, ES, 1-p A4C                     189   ml     ----------  LA volume/bsa, ES, 1-p A4C                 79.1  ml/m^2 ----------  LA volume, ES, 1-p A2C                     217   ml     ----------  LA volume/bsa, ES, 1-p A2C                 90.9  ml/m^2 ----------    Mitral valve                               Value        Reference  Mitral E-wave peak velocity                133   cm/s   ----------  Mitral deceleration time           (L)     141   ms     150 - 230  Mitral peak gradient, D                    7     mm Hg  ----------  Mitral maximal regurg velocity,            386   cm/s   ----------  PISA    Pulmonary arteries                         Value        Reference  PA pressure, S, DP                         18    mm Hg  <=30    Tricuspid valve                            Value        Reference  Tricuspid regurg peak velocity             195   cm/s   ----------  Tricuspid peak RV-RA gradient              15    mm Hg  ----------    Right atrium                               Value        Reference  RA ID, S-I, ES, A4C                (H)     91.6  mm     34 - 49  RA area, ES, A4C                   (H)     39.2  cm^2   8.3 - 19.5  RA volume, ES, A/L                         136   ml     ----------  RA volume/bsa, ES, A/L                     56.9  ml/m^2 ----------    Systemic veins                             Value        Reference  Estimated CVP                               3     mm Hg  ----------    Right ventricle                            Value        Reference  TAPSE                                      16.5  mm     ----------  RV pressure, S, DP                         18    mm Hg  <=30  Legend: (L)  and  (H)  mark values outside specified reference range.  ------------------------------------------------------------------- Prepared and Electronically Authenticated by  Jenkins Rouge, M.D. 2019-04-16T14:13:33    Recent Lab Findings: Lab Results  Component Value Date   WBC 6.2 08/08/2017   HGB 13.8 08/08/2017   HCT 42.3 08/08/2017   PLT 156 08/08/2017   GLUCOSE 138 08/08/2017   CHOL 119 07/03/2017   TRIG 127.0 07/03/2017   HDL 41.60 07/03/2017   LDLCALC 52 07/03/2017   ALT 9 08/08/2017   AST 13 08/08/2017   NA 140 08/08/2017   K 4.1 08/08/2017   CL 105 08/08/2017   CREATININE 1.71 (H) 08/08/2017   BUN 27 (H) 08/08/2017   CO2 26 08/08/2017   TSH 0.59 10/07/2014   INR 1.7 07/28/2017   HGBA1C 7.0 (H) 07/03/2017   Chronic Kidney Disease   Stage I     GFR >90  Stage II    GFR 60-89  Stage IIIA GFR 45-59  Stage IIIB GFR 30-44  Stage IV   GFR 15-29  Stage V    GFR  <15  Lab Results  Component Value Date   CREATININE 1.71 (H) 08/08/2017   Estimated Creatinine Clearance: 46.2 mL/min (A) (by C-G formula based on SCr of 1.71 mg/dL (H)). Aortic Size Index=     5.0    /Body surface area is 2.32 meters squared. 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  I reviewed with he and his wife the findings of a 5 cm ascending  aorta but would not recommend surgical intervention.  Follow-up noncontrast CT of the chest in 6 months.  chronic Systolic Heart Failure: Ischemic cardiomyopathy. 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

## 2017-08-24 NOTE — Patient Instructions (Signed)

## 2017-08-24 NOTE — Telephone Encounter (Signed)
Medication refilled and sent to pharmacy per pt request.

## 2017-08-24 NOTE — Telephone Encounter (Signed)
Patient called and stated the medication DULoxetine (CYMBALTA) 30 MG capsule is working. He wants to know if you can send in a 90 day supply to [facility]. Thanks

## 2017-08-25 DIAGNOSIS — M5417 Radiculopathy, lumbosacral region: Secondary | ICD-10-CM | POA: Diagnosis not present

## 2017-08-25 DIAGNOSIS — M5416 Radiculopathy, lumbar region: Secondary | ICD-10-CM | POA: Diagnosis not present

## 2017-08-29 ENCOUNTER — Encounter: Payer: Self-pay | Admitting: Internal Medicine

## 2017-08-29 ENCOUNTER — Ambulatory Visit: Payer: Medicare Other | Admitting: Internal Medicine

## 2017-08-29 VITALS — BP 132/64 | HR 72 | Wt 235.0 lb

## 2017-08-29 DIAGNOSIS — Z95810 Presence of automatic (implantable) cardiac defibrillator: Secondary | ICD-10-CM

## 2017-08-29 DIAGNOSIS — I10 Essential (primary) hypertension: Secondary | ICD-10-CM | POA: Diagnosis not present

## 2017-08-29 DIAGNOSIS — I5022 Chronic systolic (congestive) heart failure: Secondary | ICD-10-CM

## 2017-08-29 DIAGNOSIS — I482 Chronic atrial fibrillation, unspecified: Secondary | ICD-10-CM

## 2017-08-29 NOTE — Patient Instructions (Signed)
Medication Instructions:  Your physician recommends that you continue on your current medications as directed. Please refer to the Current Medication list given to you today.  Labwork: None ordered.  Testing/Procedures: None ordered.  Follow-Up: Your physician wants you to follow-up in: one year with Dr. Lovena Le.   You will receive a reminder letter in the mail two months in advance. If you don't receive a letter, please call our office to schedule the follow-up appointment.  Remote monitoring is used to monitor your ICD from home. This monitoring reduces the number of office visits required to check your device to one time per year. It allows [place] to keep an eye on the functioning of your device to ensure it is working properly. You are scheduled for a device check from home on 11/01/2017. You may send your transmission at any time that day. If you have a wireless device, the transmission will be sent automatically. After your physician reviews your transmission, you will receive a postcard with your next transmission date.  Any Other Special Instructions Will Be Listed Below (If Applicable).  If you need a refill on your cardiac medications before your next appointment, please call your pharmacy.

## 2017-08-29 NOTE — Progress Notes (Addendum)
HPI Mr. Knipple returns today for followup. He has been bothered by musculoskeletal symptoms and has a h/o class 2-3 CHF. He has become more sedentary due to his back pain. He has chronic peripheral edema but this has improved as he has finally gotten serious about a low sodium diet. He does not remember receiving an ICD shock.  Allergies  Allergen Reactions  . Avelox [Moxifloxacin Hcl In Nacl] Swelling, Rash and Other (See Comments)    Patient became hypotensive after infusion started Because of a history of documented adverse serious drug reaction;Medi Alert bracelet  is recommended  . Penicillins Anaphylaxis, Swelling and Other (See Comments)    REACTION: anaphylaxis Because of a history of documented adverse serious drug reaction;Medi Alert bracelet  is recommended Has patient had a PCN reaction causing immediate rash, facial/tongue/throat swelling, SOB or lightheadedness with hypotension: Yes Has patient had a PCN reaction causing severe rash involving mucus membranes or skin necrosis: No Has patient had a PCN reaction that required hospitalization: Unknown Has patient had a PCN reaction occurring within the last 10 years: No      Current Outpatient Medications  Medication Sig Dispense Refill  . ACCU-CHEK AVIVA PLUS test strip USE 1 STRIP TO CHECK GLUCOSE THREE TIMES DAILY AS DIRECTED 300 each 3  . ACCU-CHEK FASTCLIX LANCETS MISC USE ONE LANCET TO CHECK GLUCOSE THREE TIMES DAILY 102 each 5  . acetaminophen (TYLENOL) 325 MG tablet Take 650-975 mg by mouth 4 (four) times daily as needed for moderate pain.     Marland Kitchen allopurinol (ZYLOPRIM) 100 MG tablet TAKE TWO TABLETS BY MOUTH ONCE DAILY (Patient taking differently: TAKE TWO TABLETS BY MOUTH ONCE DAILY IN THE EVENING.) 180 tablet 3  . atorvastatin (LIPITOR) 10 MG tablet Take 1 tablet (10 mg total) by mouth daily. 90 tablet 3  . carboxymethylcellulose (REFRESH PLUS) 0.5 % SOLN Place 2 drops into both eyes daily.     . carvedilol  (COREG) 12.5 MG tablet Take 1.5 tablets (18.75 mg total) by mouth 2 (two) times daily with a meal. 90 tablet 6  . cholecalciferol (VITAMIN D) 1000 units tablet Take 1,000 Units by mouth daily.    . colchicine 0.6 MG tablet Take 0.6 mg by mouth daily as needed (gout flares).     Marland Kitchen digoxin (LANOXIN) 0.125 MG tablet Take 0.5 tablets (0.0625 mg total) by mouth daily. 15 tablet 6  . DULoxetine (CYMBALTA) 30 MG capsule Take 1 capsule (30 mg total) by mouth daily. 90 capsule 1  . gabapentin (NEURONTIN) 300 MG capsule Take 2 capsules (600 mg total) by mouth 3 (three) times daily. TAKE ONE CAPSULE BY MOUTH 5 TIMES DAILY AS NEEDED FOR LEG PAIN (Patient taking differently: Take 300-600 mg by mouth See admin instructions. TAKE 2 CAPSULES (600 MG) IN THE MORNING, 2 CAPSULES (600 MG) IN THE EVENING, & 1 CAPSULE (300 MG) BY MOUTH AT BEDTIME.) 540 capsule 3  . insulin NPH Human (NOVOLIN N) 100 UNIT/ML injection Inject 6-8 Units into the skin See admin instructions. Inject 8 units subcutaneously in the morning and inject 6 units subcutaneously in the evening.    . insulin regular (NOVOLIN R) 100 units/mL injection Inject 6 Units into the skin daily before supper.     . Investigational - Study Medication Take 1 tablet by mouth daily. Study name: [place] Additional study details: [id number] (vericiguat) or Placebo 1 each PRN  . ipratropium (ATROVENT) 0.06 % nasal spray Place 2 sprays into both nostrils daily.     Marland Kitchen  lidocaine (LIDODERM) 5 % Place 1 patch onto the skin daily as needed (for pain.).     Marland Kitchen methocarbamol (ROBAXIN) 750 MG tablet Take 1 tablet (750 mg total) by mouth 2 (two) times daily as needed for muscle spasms. 90 tablet 1  . metroNIDAZOLE (METROGEL) 0.75 % gel Apply 1 application topically daily as needed. FOR ROSACEA  2  . nitroGLYCERIN (NITROSTAT) 0.4 MG SL tablet Place 1 tablet (0.4 mg total) under the tongue every 5 (five) minutes as needed for chest pain (MAX 3 TABLETS). 25 tablet 3  . omeprazole  (PRILOSEC) 20 MG capsule Take 2 capsules (40 mg total) by mouth 2 (two) times daily. 120 capsule 11  . potassium chloride SA (K-DUR,KLOR-CON) 20 MEQ tablet Take 1 tablet (20 mEq total) by mouth daily. 90 tablet 3  . sacubitril-valsartan (ENTRESTO) 24-26 MG Take 1 tablet by mouth 2 (two) times daily. 60 tablet 11  . torsemide (DEMADEX) 20 MG tablet Take 4 tablets (80 mg total) by mouth 2 (two) times daily.    . traMADol (ULTRAM) 50 MG tablet TAKE TWO TABLETS BY MOUTH EVERY 8 HOURS AS NEEDED FOR PAIN. 180 tablet 1  . warfarin (COUMADIN) 2.5 MG tablet TAKE AS DIRECTED BY ADMIN INSTRUCTIONS (Patient taking differently: TAKE 2 TABLETS (5 MG) BY MOUTH ON TUESDAYS AND SATURDAYS, THEN TAKE 1 TABLET (2.5 MG) BY MOUTH ON ALL OTHER DAYS.) 120 tablet 1   No current facility-administered medications for this visit.      Past Medical History:  Diagnosis Date  . AAA (abdominal aortic aneurysm) ([place])    Surgical repair 11/2002.  . Adenomatous colon polyp   . Alcohol ingestion of more than four drinks per week    Excess beer  not a dependency problem  . Aortic valve sclerosis   . Arthritis   . Atrial fibrillation ([place])    Previous long-term amiodarone therapy with multiple cardioversions / amiodarone stopped September, 2009  . Atrial flutter [facility])    Started November, 2010, Left-sided and cannot ablate  . Bony abnormality    Patient's manubrium is slightly displaced to the right  . CAD (coronary artery disease)    a. s/p CABG 2004;  b. 02/2016 Cath: LM nl, LAD 70p, 158m, D1 50, D2 50, RI 50ost, LCX 43m, OM2 100, OM3 100, RCA 70p, 100d, VG->OM3 ok, LIMA->LAD 60ost, VG->RPDA  ok.  . 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 01/2013 MDT DTBA 1D1 [place] CRT-D, ser # [id number].  . Carotid artery disease ([place])    Doppler, December, 2013, 0-39% bilateral  . Chronic systolic CHF (congestive heart failure) ([place])    a. 02/2016 Echo: EF 35-40%, diff HK, triv AI, mildly dil Ao root  67mm, mild MR, sev dil LA, triv TR.  Marland Kitchen Chronotropic incompetence    IV pacing rate adjusted  . CKD (chronic kidney disease), stage III ([place])   . COPD (chronic obstructive pulmonary disease) ([place])   . Dilated aortic root ([place])   . Discolored skin   . Diverticulosis   . Drug therapy    Redness and swelling with Avelox infusion May 24, 2011  . Eye abnormality    Ophthalmologist questions a clot in one of his eyes, May, 2012  . Gout   . Hyperlipidemia   . Hypertension   . Internal hemorrhoids   . Ischemic cardiomyopathy   . Left atrial thrombus    Remote past... cardioversions done since that time  . Mitral regurgitation  Mild echo  . Myocardial infarction ([place])   . Nasal drainage    Chronic  . Overweight(278.02)   . Pericardial effusion   . Pleural effusion    Large loculated effusion on the left side November, 2011. This was tapped. It was exudative. Cytology revealed no cancer no proof of mesothelioma area pulmonary team felt that no further workup was needed  . Pleural thickening   . Pneumonia   . Polycythemia vera ([place]) 07/28/2014  . SOB (shortness of breath)    Large left effusion/ thoracentesis/hospitalization/November, 2011... exudated.. cytology negative.. Dr.Wert.. no proof of mesothelioma  . Spinal stenosis    Surgery Dr.Elsner  . Thrombophlebitis of superficial veins of upper extremities    Possible venous stenosis from defibrillator  . Type II diabetes mellitus ([place]) 2008  . Venous insufficiency    Toe discoloration chronic  . Ventral hernia    April, 2014, result of his abdominal surgery  . Warfarin anticoagulation   . Wide-complex tachycardia ([place])     ROS:   All systems reviewed and negative except as noted in the HPI.   Past Surgical History:  Procedure Laterality Date  . ABDOMINAL AORTIC ANEURYSM REPAIR  11/2002  . ANTERIOR CERVICAL DECOMP/DISCECTOMY FUSION  1995  . BACK SURGERY    . BI-VENTRICULAR PACEMAKER INSERTION (CRT-P)  02-11-2013   Pt  with previously implanted MDT CRTD downgraded to CRTP by Dr Lovena Le 02-11-13  . CARDIAC CATHETERIZATION  "several"  . CARDIAC CATHETERIZATION N/A 03/18/2016   Procedure: RIGHT/LEFT HEART CATH AND CORONARY/GRAFT ANGIOGRAPHY;  Surgeon: Nelva Bush, MD;  Location: [facility];  Service: Cardiovascular;  Laterality: N/A;  . CATARACT EXTRACTION W/ INTRAOCULAR LENS  IMPLANT, BILATERAL    . COLONOSCOPY W/ POLYPECTOMY    . CORONARY ANGIOPLASTY WITH STENT PLACEMENT  2004  . CORONARY ARTERY BYPASS GRAFT  2004   CABG X4  . FRACTURE SURGERY    . IMPLANTABLE CARDIOVERTER DEFIBRILLATOR (ICD) GENERATOR CHANGE N/A 02/11/2013   Procedure: ICD GENERATOR CHANGE;  Surgeon: Evans Lance, MD;  Location: [facility] CATH LAB;  Service: Cardiovascular;  Laterality: N/A;  . INCISION AND DRAINAGE ABSCESS / HEMATOMA OF BURSA / KNEE / THIGH    . INSERT / REPLACE / REMOVE PACEMAKER  2009   original pacer/defibrillator; Dr. Lovena Le 2009... by the pacing  . LUMBAR FUSION  2010  . ORIF TIBIA & FIBULA FRACTURES Right 2000s  . PILONIDAL CYST EXCISION    . RIGHT HEART CATH N/A 10/19/2016   Procedure: Right Heart Cath;  Surgeon: [name], MD;  Location: [facility];  Service: Cardiovascular;  Laterality: N/A;  . SPINAL CORD STIMULATOR IMPLANT  12/2011  . SURGERY SCROTAL / TESTICULAR       Family History  Problem Relation Age of Onset  . Hypertension Mother   . Stroke Mother   . Diabetes Father   . Coronary artery disease Father   . Other Father        DVT  . Diabetes Brother   . Colon cancer Neg Hx   . Heart attack Neg Hx      Social History   Socioeconomic History  . Marital status: Married    Spouse name: Not on file  . Number of children: Not on file  . Years of education: Not on file  . Highest education level: Not on file  Occupational History  . Occupation: Retired- Nurse, mental health  Social Needs  . Financial resource strain: Not on file  . Food  insecurity:    Worry: Not  on file    Inability: Not on file  . Transportation needs:    Medical: Not on file    Non-medical: Not on file  Tobacco Use  . Smoking status: Former Smoker    Packs/day: 3.00    Years: 39.00    Pack years: 117.00    Types: Cigarettes    Start date: 05/24/1956    Last attempt to quit: 03/22/1995    Years since quitting: 22.4  . Smokeless tobacco: Never Used  Substance and Sexual Activity  . Alcohol use: Yes    Alcohol/week: 1.2 oz    Types: 2 Cans of beer per week  . Drug use: No  . Sexual activity: Never  Lifestyle  . Physical activity:    Days per week: Not on file    Minutes per session: Not on file  . Stress: Not on file  Relationships  . Social connections:    Talks on phone: Not on file    Gets together: Not on file    Attends religious service: Not on file    Active member of club or organization: Not on file    Attends meetings of clubs or organizations: Not on file    Relationship status: Not on file  . Intimate partner violence:    Fear of current or ex partner: Not on file    Emotionally abused: Not on file    Physically abused: Not on file    Forced sexual activity: Not on file  Other Topics Concern  . Not on file  Social History Narrative  . Not on file     BP 132/64   Pulse 72   Wt 235 lb (106.6 kg)   BMI 31.87 kg/m   Physical Exam:  Well appearing NAD HEENT: Unremarkable Neck:  No JVD, no thyromegally Lymphatics:  No adenopathy Back:  No CVA tenderness Lungs:  Clear HEART:  Regular rate rhythm, no murmurs, no rubs, no clicks Abd:  soft, positive bowel sounds, no organomegally, no rebound, no guarding Ext:  2 plus pulses, no edema, no cyanosis, no clubbing Skin:  No rashes no nodules Neuro:  CN II through XII intact, motor grossly intact  EKG - atrial fib with an IVCD  DEVICE  Normal device function.  See PaceArt for details.   Assess/Plan: 1. Atrial fib with a controlled VR - his rate is now controlled. He will continue his current  meds. 2. VF - he has had an episode of VF. He is encouraged not to drive until 6 months have ellapsed. 3. HTN - his blood pressure is under better controll. 4. ICD - interogation demonstrates normal device function.   [name].D.

## 2017-09-01 ENCOUNTER — Telehealth (HOSPITAL_COMMUNITY): Payer: Self-pay | Admitting: *Deleted

## 2017-09-01 ENCOUNTER — Ambulatory Visit: Payer: Medicare Other | Admitting: *Deleted

## 2017-09-01 DIAGNOSIS — I482 Chronic atrial fibrillation: Secondary | ICD-10-CM | POA: Diagnosis not present

## 2017-09-01 DIAGNOSIS — I4891 Unspecified atrial fibrillation: Secondary | ICD-10-CM | POA: Diagnosis not present

## 2017-09-01 DIAGNOSIS — I4821 Permanent atrial fibrillation: Secondary | ICD-10-CM

## 2017-09-01 LAB — POCT INR: INR: 2 (ref 2.0–3.0)

## 2017-09-01 NOTE — Patient Instructions (Signed)
Description   Today take 1.5 tablets, then continue 1 tablet every day except 2 tablets on Tuesdays and Saturdays.  Recheck in 4 weeks.  Call with any new medications of changes  [phone] [facility].

## 2017-09-01 NOTE — Telephone Encounter (Signed)
Threshold: 15-20  Reading: 27  Instructions: take an extra 20 mg of Torsemide for 2 days needs f/u appt soon per Darrick Grinder, NP  Spoke w/pt's wife she is agreeable, she states pt feels ok, no increase in SOB or edema.  Will take extra Torsemide today and tomorrow, f/u appt sch for Wed 6/19 with Dr Aundra Dubin

## 2017-09-06 ENCOUNTER — Encounter (HOSPITAL_COMMUNITY): Payer: Self-pay

## 2017-09-06 ENCOUNTER — Ambulatory Visit (HOSPITAL_COMMUNITY)
Admission: RE | Admit: 2017-09-06 | Discharge: 2017-09-06 | Disposition: A | Discharge status: Home / Self Care | Payer: Medicare Other | Source: Ambulatory Visit | Attending: Cardiology | Admitting: Cardiology

## 2017-09-06 ENCOUNTER — Encounter (HOSPITAL_COMMUNITY): Payer: Self-pay | Admitting: Cardiology

## 2017-09-06 ENCOUNTER — Other Ambulatory Visit: Payer: Self-pay

## 2017-09-06 VITALS — BP 138/67 | HR 56 | Wt 230.1 lb

## 2017-09-06 DIAGNOSIS — E785 Hyperlipidemia, unspecified: Secondary | ICD-10-CM | POA: Insufficient documentation

## 2017-09-06 DIAGNOSIS — I712 Thoracic aortic aneurysm, without rupture: Secondary | ICD-10-CM | POA: Diagnosis not present

## 2017-09-06 DIAGNOSIS — M109 Gout, unspecified: Secondary | ICD-10-CM | POA: Insufficient documentation

## 2017-09-06 DIAGNOSIS — I482 Chronic atrial fibrillation, unspecified: Secondary | ICD-10-CM

## 2017-09-06 DIAGNOSIS — Z87891 Personal history of nicotine dependence: Secondary | ICD-10-CM | POA: Diagnosis not present

## 2017-09-06 DIAGNOSIS — I5022 Chronic systolic (congestive) heart failure: Secondary | ICD-10-CM | POA: Diagnosis not present

## 2017-09-06 DIAGNOSIS — Z95810 Presence of automatic (implantable) cardiac defibrillator: Secondary | ICD-10-CM | POA: Diagnosis not present

## 2017-09-06 DIAGNOSIS — I2510 Atherosclerotic heart disease of native coronary artery without angina pectoris: Secondary | ICD-10-CM | POA: Diagnosis not present

## 2017-09-06 DIAGNOSIS — E1122 Type 2 diabetes mellitus with diabetic chronic kidney disease: Secondary | ICD-10-CM | POA: Diagnosis not present

## 2017-09-06 DIAGNOSIS — N183 Chronic kidney disease, stage 3 (moderate): Secondary | ICD-10-CM | POA: Insufficient documentation

## 2017-09-06 DIAGNOSIS — Z79899 Other long term (current) drug therapy: Secondary | ICD-10-CM | POA: Diagnosis not present

## 2017-09-06 DIAGNOSIS — I255 Ischemic cardiomyopathy: Secondary | ICD-10-CM | POA: Diagnosis not present

## 2017-09-06 DIAGNOSIS — Z7901 Long term (current) use of anticoagulants: Secondary | ICD-10-CM | POA: Diagnosis not present

## 2017-09-06 DIAGNOSIS — Z794 Long term (current) use of insulin: Secondary | ICD-10-CM | POA: Insufficient documentation

## 2017-09-06 DIAGNOSIS — Z951 Presence of aortocoronary bypass graft: Secondary | ICD-10-CM | POA: Insufficient documentation

## 2017-09-06 LAB — BASIC METABOLIC PANEL
ANION GAP: 12 (ref 5–15)
BUN: 32 mg/dL — ABNORMAL HIGH (ref 6–20)
CHLORIDE: 101 mmol/L (ref 101–111)
CO2: 29 mmol/L (ref 22–32)
Calcium: 8.7 mg/dL — ABNORMAL LOW (ref 8.9–10.3)
Creatinine, Ser: 1.45 mg/dL — ABNORMAL HIGH (ref 0.61–1.24)
GFR calc Af Amer: 52 mL/min — ABNORMAL LOW (ref >60)
GFR calc non Af Amer: 45 mL/min — ABNORMAL LOW (ref >60)
Glucose, Bld: 135 mg/dL — ABNORMAL HIGH (ref 65–99)
POTASSIUM: 3.6 mmol/L (ref 3.5–5.1)
SODIUM: 142 mmol/L (ref 135–145)

## 2017-09-06 LAB — DIGOXIN LEVEL: DIGOXIN LVL: 0.4 ng/mL — AB (ref 0.8–2.0)

## 2017-09-06 MED ORDER — CARVEDILOL 25 MG PO TABS: 25.0000 mg | ORAL_TABLET | Freq: Two times a day (BID) | ORAL | 3 refills | Status: DC

## 2017-09-06 NOTE — Progress Notes (Signed)
PCP: Dr. Quay Burow Cardiologist: Dr Aundra Dubin   HPI: Mr Emig is a 77 y.o. with history of CAD s/p CABG, permanent atrial fib, S/P AAA repair, ischemic cardiomyopathy/chronic systolic heart failure,  Medtronic CRT-D system.   Admitted 4/14 through 07/06/16 with volume overload. Diuresed with IV lasix and transitioned to torsemide 60 mg daily. HF meds adjusted and he was started on Entresto. Discharge weight was 220 pounds.  He now has a Cardiomems device. Torsemide has been increased to 80 mg bid based on Cardiomems evaluation.   Echo in 4/19 showed EF improved to 40-45%.  He stopped eplerenone due to breast pain which has resolved (same thing with spironolactone).   He returns for followup of CHF and CAD.  He remains in atrial fibrillation (permanent).  We have been following his Cardiomems, goal PADP has been 15-20 but it has been trending up steadily despite increased torsemide, PADP 28 today.  Weight is down 8 lbs.  No dyspnea walking on flat ground. No chest pain.  No orthopnea/PND. He is short of breath generally with showering and dressing (mildly). Low back pain improved after recent injection.    PADP 28 mmHg by CardioMems    Medtronic device interrogation: No VT, fluid index < threshold with rising thoracic impedance.  Labs (5/18): K 4.7, creatinine 1.7, hgb 15.8, BNP 1001, LDL 71 Labs (8/18): K 4.3, creatinine 1.56, hgb 14.3 Labs (9/18): K 4, creatinine 1.79, digoxin 0.8 Labs (10/18): digoxin 0.3 Labs (11/18): K 4, creatinine 1.71 Labs (4/19): K 3.6, creatinine 1.45, LDL 52, HDL 42 Labs (5/19): K 4.1, creatinine 1.71  PMH: 1. CAD: s/p CABG.  - LHC (12/17): totally occluded distal RCA, totally occluded mid LAD, totally occluded OM2 and OM3.  LIMA-LAD patent with 60% ostial stenosis.  SVG-OM3 and SVG-PDA were patent. Medical management.  2. Chronic systolic CHF: Ischemic cardiomyopathy.  He has a Medtronic CRT-D device.  - Echo (12/17) with EF 35-40%, diffuse hypokinesis, normal RV  size and systolic function, mild MR.  - CardioMems placed.  - Painful gynecomastia with spironolactone and eplerenone.  - Echo (4/19) with EF 40-45%, mild MR.  3. AAA: s/p repair.  4. Atrial fibrillation: Permanent.  5. Gout 6. PAD: Saw Dr Scot Dock with [facility] in 4/18.  Noninvasive study concerning for infra-inguinal arterial occlusive disease bilaterally but appeared to have adequate circulation to heal toe ulcer.  7. CKD stage 3.  8. Ascending aortic aneurysm: CT chest 3/18 ascending aorta 4.9 cm, arch 4.4 cm.  - CTA chest (5/19): 5.2 cm ascending aorta.  9. Spinal stenosis 10. Polycythemia vera 11. Hyperlipidemia 12. Type II diabetes    ROS: All systems negative except as listed in HPI, PMH and Problem List.  Social History   Socioeconomic History  . Marital status: Married    Spouse name: Not on file  . Number of children: Not on file  . Years of education: Not on file  . Highest education level: Not on file  Occupational History  . Occupation: Retired- Nurse, mental health  Social Needs  . Financial resource strain: Not on file  . Food insecurity:    Worry: Not on file    Inability: Not on file  . Transportation needs:    Medical: Not on file    Non-medical: Not on file  Tobacco Use  . Smoking status: Former Smoker    Packs/day: 3.00    Years: 39.00    Pack years: 117.00    Types: Cigarettes    Start date: 05/24/1956  Last attempt to quit: 03/22/1995    Years since quitting: 22.4  . Smokeless tobacco: Never Used  Substance and Sexual Activity  . Alcohol use: Yes    Alcohol/week: 1.2 oz    Types: 2 Cans of beer per week  . Drug use: No  . Sexual activity: Never  Lifestyle  . Physical activity:    Days per week: Not on file    Minutes per session: Not on file  . Stress: Not on file  Relationships  . Social connections:    Talks on phone: Not on file    Gets together: Not on file    Attends religious service: Not on file    Active member of club or  organization: Not on file    Attends meetings of clubs or organizations: Not on file    Relationship status: Not on file  . Intimate partner violence:    Fear of current or ex partner: Not on file    Emotionally abused: Not on file    Physically abused: Not on file    Forced sexual activity: Not on file  Other Topics Concern  . Not on file  Social History Narrative  . Not on file    Family History  Problem Relation Age of Onset  . Hypertension Mother   . Stroke Mother   . Diabetes Father   . Coronary artery disease Father   . Other Father        DVT  . Diabetes Brother   . Colon cancer Neg Hx   . Heart attack Neg Hx      Current Outpatient Medications  Medication Sig Dispense Refill  . ACCU-CHEK AVIVA PLUS test strip USE 1 STRIP TO CHECK GLUCOSE THREE TIMES DAILY AS DIRECTED 300 each 3  . ACCU-CHEK FASTCLIX LANCETS MISC USE ONE LANCET TO CHECK GLUCOSE THREE TIMES DAILY 102 each 5  . acetaminophen (TYLENOL) 325 MG tablet Take 650-975 mg by mouth 4 (four) times daily as needed for moderate pain.     Marland Kitchen allopurinol (ZYLOPRIM) 100 MG tablet TAKE TWO TABLETS BY MOUTH ONCE DAILY 180 tablet 3  . atorvastatin (LIPITOR) 10 MG tablet Take 1 tablet (10 mg total) by mouth daily. 90 tablet 3  . carboxymethylcellulose (REFRESH PLUS) 0.5 % SOLN Place 2 drops into both eyes daily.     . carvedilol (COREG) 25 MG tablet Take 1 tablet (25 mg total) by mouth 2 (two) times daily with a meal. 60 tablet 3  . cholecalciferol (VITAMIN D) 1000 units tablet Take 1,000 Units by mouth daily.    . colchicine 0.6 MG tablet Take 0.6 mg by mouth daily as needed (gout flares).     Marland Kitchen digoxin (LANOXIN) 0.125 MG tablet Take 0.5 tablets (0.0625 mg total) by mouth daily. 15 tablet 6  . DULoxetine (CYMBALTA) 30 MG capsule Take 1 capsule (30 mg total) by mouth daily. 90 capsule 1  . gabapentin (NEURONTIN) 300 MG capsule Take 2 capsules (600 mg total) by mouth 3 (three) times daily. TAKE ONE CAPSULE BY MOUTH 5 TIMES  DAILY AS NEEDED FOR LEG PAIN (Patient taking differently: Take 300-600 mg by mouth See admin instructions. TAKE 2 CAPSULES (600 MG) IN THE MORNING, 2 CAPSULES (600 MG) IN THE EVENING, & 1 CAPSULE (300 MG) BY MOUTH AT BEDTIME.) 540 capsule 3  . insulin NPH Human (NOVOLIN N) 100 UNIT/ML injection Inject 6-8 Units into the skin See admin instructions. Inject 8 units subcutaneously in the morning and inject 6  units subcutaneously in the evening.    . insulin regular (NOVOLIN R) 100 units/mL injection Inject 6 Units into the skin daily before supper.     . Investigational - Study Medication Take 1 tablet by mouth daily. Study name: [place] Additional study details: MK-1242 (vericiguat) or Placebo 1 each PRN  . ipratropium (ATROVENT) 0.06 % nasal spray Place 2 sprays into both nostrils daily.     Marland Kitchen lidocaine (LIDODERM) 5 % Place 1 patch onto the skin daily as needed (for pain.).     Marland Kitchen methocarbamol (ROBAXIN) 750 MG tablet Take 1 tablet (750 mg total) by mouth 2 (two) times daily as needed for muscle spasms. 90 tablet 1  . metroNIDAZOLE (METROGEL) 0.75 % gel Apply 1 application topically daily as needed. FOR ROSACEA  2  . nitroGLYCERIN (NITROSTAT) 0.4 MG SL tablet Place 1 tablet (0.4 mg total) under the tongue every 5 (five) minutes as needed for chest pain (MAX 3 TABLETS). 25 tablet 3  . omeprazole (PRILOSEC) 20 MG capsule Take 2 capsules (40 mg total) by mouth 2 (two) times daily. 120 capsule 11  . potassium chloride SA (K-DUR,KLOR-CON) 20 MEQ tablet Take 1 tablet (20 mEq total) by mouth daily. 90 tablet 3  . sacubitril-valsartan (ENTRESTO) 24-26 MG Take 1 tablet by mouth 2 (two) times daily. 60 tablet 11  . torsemide (DEMADEX) 20 MG tablet Take 4 tablets (80 mg total) by mouth 2 (two) times daily.    . traMADol (ULTRAM) 50 MG tablet TAKE TWO TABLETS BY MOUTH EVERY 8 HOURS AS NEEDED FOR PAIN. 180 tablet 1  . warfarin (COUMADIN) 2.5 MG tablet TAKE AS DIRECTED BY ADMIN INSTRUCTIONS (Patient taking  differently: TAKE 2 TABLETS (5 MG) BY MOUTH ON TUESDAYS AND SATURDAYS, THEN TAKE 1 TABLET (2.5 MG) BY MOUTH ON ALL OTHER DAYS.) 120 tablet 1   No current facility-administered medications for this encounter.     Vitals:   09/06/17 0931  BP: 138/67  Pulse: (!) 56  SpO2: 100%  Weight: 230 lb 2 oz (104.4 kg)    PHYSICAL EXAM: General: NAD Neck: No JVD, no thyromegaly or thyroid nodule.  Lungs: Clear to auscultation bilaterally with normal respiratory effort. CV: Nondisplaced PMI.  Heart irregular S1/S2, no S3/S4, no murmur.  1+ ankle edema.  No carotid bruit.  Normal pedal pulses.  Abdomen: Soft, nontender, no hepatosplenomegaly, no distention.  Skin: Intact without lesions or rashes.  Neurologic: Alert and oriented x 3.  Psych: Normal affect. Extremities: No clubbing or cyanosis.  HEENT: Normal.    ASSESSMENT & PLAN: 1. Chronic Systolic Heart Failure: Ischemic cardiomyopathy. Echo 4/19 with EF 40-45%, some improvement. Medtronic CRT-D.  NYHA class II.   Exam and Optivol do not suggest volume overload and weight is down, but PADP has been steadily rising by CardioMems.  I am concerned that his CardioMems readings may not be accurate and that we are at risk for over-diuresing him and impairing his renal function.  - Continue torsemide 80 mg bid for now.  However, I will arrange for RHC to assess volume status.  We will not use CardioMems until RHC is done.  I discussed risks/benefits of procedure and he agrees.   - Continue digoxin, check level today.  - Continue current Entresto, unable to increase due to significant lightheadedness on higher dose.  - Unable to take eplerenone or spironolactone due to breast tenderness.  This has mostly resolved off these meds.   - Increase Coreg to 25 mg bid.  -  BMET today.  2. Atrial fibrillation: Permanent.  Rate-controlled.  Continue warfarin.   3. CAD: s/p CABG.  No chest pain.  - No ASA given warfarin use.  - Continue statin.  Good lipids 4/19.    4. CKD Stage III: Recent creatinine 1.7.  5. Gout: stable. Continue allopurinol.  6. Ascending aortic aneurysm: 5/19 CT chest with 5.2 cm ascending aorta.  He saw Dr. Servando Snare.  Aneurysm has been relatively stable, would be high risk for operation.  For now, will monitor with no plans for surgery.   - Keep SBP < 130 if possible, increasing Coreg as above.  - Will get repeat CT chest w/o contrast in 11/19.    Followup in 2 months.   Loralie Champagne  09/06/2017

## 2017-09-06 NOTE — Patient Instructions (Signed)
Increase Carvedilol 25 mg (1 tab), twice a day   Your physician has requested that you have a cardiac catheterization. Cardiac catheterization is used to diagnose and/or treat various heart conditions. Doctors may recommend this procedure for a number of different reasons. The most common reason is to evaluate chest pain. Chest pain can be a symptom of coronary artery disease (CAD), and cardiac catheterization can show whether plaque is narrowing or blocking your heart's arteries. This procedure is also used to evaluate the valves, as well as measure the blood flow and oxygen levels in different parts of your heart. For further information please visit [email or web address]. Please follow instruction sheet, as given.  Labs drawn today (if we do not call you, then your lab work was stable)   Your physician recommends that you schedule a follow-up appointment in: 2 months with Dr. Aundra Dubin

## 2017-09-06 NOTE — H&P (View-Only) (Signed)
PCP: Dr. Quay [name] Cardiologist: Dr [name]   HPI: [name] is a 77 y.o. with history of CAD s/p CABG, permanent atrial fib, S/P AAA repair, ischemic cardiomyopathy/chronic systolic heart failure,  Medtronic CRT-D system.   Admitted 4/14 through 07/06/16 with volume overload. Diuresed with IV lasix and transitioned to torsemide 60 mg daily. HF meds adjusted and he was started on Entresto. Discharge weight was 220 pounds.  He now has a Cardiomems device. Torsemide has been increased to 80 mg bid based on Cardiomems evaluation.   Echo in 4/19 showed EF improved to 40-45%.  He stopped eplerenone due to breast pain which has resolved (same thing with spironolactone).   He returns for followup of CHF and CAD.  He remains in atrial fibrillation (permanent).  We have been following his Cardiomems, goal PADP has been 15-20 but it has been trending up steadily despite increased torsemide, PADP 28 today.  Weight is down 8 lbs.  No dyspnea walking on flat ground. No chest pain.  No orthopnea/PND. He is short of breath generally with showering and dressing (mildly). Low back pain improved after recent injection.    PADP 28 mmHg by CardioMems    Medtronic device interrogation: No VT, fluid index < threshold with rising thoracic impedance.  Labs (5/18): K 4.7, creatinine 1.7, hgb 15.8, BNP 1001, LDL 71 Labs (8/18): K 4.3, creatinine 1.56, hgb 14.3 Labs (9/18): K 4, creatinine 1.79, digoxin 0.8 Labs (10/18): digoxin 0.3 Labs (11/18): K 4, creatinine 1.71 Labs (4/19): K 3.6, creatinine 1.45, LDL 52, HDL 42 Labs (5/19): K 4.1, creatinine 1.71  PMH: 1. CAD: s/p CABG.  - LHC (12/17): totally occluded distal RCA, totally occluded mid LAD, totally occluded OM2 and OM3.  LIMA-LAD patent with 60% ostial stenosis.  SVG-OM3 and SVG-PDA were patent. Medical management.  2. Chronic systolic CHF: Ischemic cardiomyopathy.  He has a Medtronic CRT-D device.  - Echo (12/17) with EF 35-40%, diffuse hypokinesis, normal RV  size and systolic function, mild MR.  - CardioMems placed.  - Painful gynecomastia with spironolactone and eplerenone.  - Echo (4/19) with EF 40-45%, mild MR.  3. AAA: s/p repair.  4. Atrial fibrillation: Permanent.  5. Gout 6. PAD: Saw Dr [name] with [facility] in 4/18.  Noninvasive study concerning for infra-inguinal arterial occlusive disease bilaterally but appeared to have adequate circulation to heal toe ulcer.  7. CKD stage 3.  8. Ascending aortic aneurysm: CT chest 3/18 ascending aorta 4.9 cm, arch 4.4 cm.  - CTA chest (5/19): 5.2 cm ascending aorta.  9. Spinal stenosis 10. Polycythemia vera 11. Hyperlipidemia 12. Type II diabetes    ROS: All systems negative except as listed in HPI, PMH and Problem List.  Social History   Socioeconomic History  . Marital status: Married    Spouse name: Not on file  . Number of children: Not on file  . Years of education: Not on file  . Highest education level: Not on file  Occupational History  . Occupation: Retired- Nurse, mental health  Social Needs  . Financial resource strain: Not on file  . Food insecurity:    Worry: Not on file    Inability: Not on file  . Transportation needs:    Medical: Not on file    Non-medical: Not on file  Tobacco Use  . Smoking status: Former Smoker    Packs/day: 3.00    Years: 39.00    Pack years: 117.00    Types: Cigarettes    Start date: 05/24/1956  Last attempt to quit: 03/22/1995    Years since quitting: 22.4  . Smokeless tobacco: Never Used  Substance and Sexual Activity  . Alcohol use: Yes    Alcohol/week: 1.2 oz    Types: 2 Cans of beer per week  . Drug use: No  . Sexual activity: Never  Lifestyle  . Physical activity:    Days per week: Not on file    Minutes per session: Not on file  . Stress: Not on file  Relationships  . Social connections:    Talks on phone: Not on file    Gets together: Not on file    Attends religious service: Not on file    Active member of club or  organization: Not on file    Attends meetings of clubs or organizations: Not on file    Relationship status: Not on file  . Intimate partner violence:    Fear of current or ex partner: Not on file    Emotionally abused: Not on file    Physically abused: Not on file    Forced sexual activity: Not on file  Other Topics Concern  . Not on file  Social History Narrative  . Not on file    Family History  Problem Relation Age of Onset  . Hypertension Mother   . Stroke Mother   . Diabetes Father   . Coronary artery disease Father   . Other Father        DVT  . Diabetes Brother   . Colon cancer Neg Hx   . Heart attack Neg Hx      Current Outpatient Medications  Medication Sig Dispense Refill  . ACCU-CHEK AVIVA PLUS test strip USE 1 STRIP TO CHECK GLUCOSE THREE TIMES DAILY AS DIRECTED 300 each 3  . ACCU-CHEK FASTCLIX LANCETS MISC USE ONE LANCET TO CHECK GLUCOSE THREE TIMES DAILY 102 each 5  . acetaminophen (TYLENOL) 325 MG tablet Take 650-975 mg by mouth 4 (four) times daily as needed for moderate pain.     Marland Kitchen allopurinol (ZYLOPRIM) 100 MG tablet TAKE TWO TABLETS BY MOUTH ONCE DAILY 180 tablet 3  . atorvastatin (LIPITOR) 10 MG tablet Take 1 tablet (10 mg total) by mouth daily. 90 tablet 3  . carboxymethylcellulose (REFRESH PLUS) 0.5 % SOLN Place 2 drops into both eyes daily.     . carvedilol (COREG) 25 MG tablet Take 1 tablet (25 mg total) by mouth 2 (two) times daily with a meal. 60 tablet 3  . cholecalciferol (VITAMIN D) 1000 units tablet Take 1,000 Units by mouth daily.    . colchicine 0.6 MG tablet Take 0.6 mg by mouth daily as needed (gout flares).     Marland Kitchen digoxin (LANOXIN) 0.125 MG tablet Take 0.5 tablets (0.0625 mg total) by mouth daily. 15 tablet 6  . DULoxetine (CYMBALTA) 30 MG capsule Take 1 capsule (30 mg total) by mouth daily. 90 capsule 1  . gabapentin (NEURONTIN) 300 MG capsule Take 2 capsules (600 mg total) by mouth 3 (three) times daily. TAKE ONE CAPSULE BY MOUTH 5 TIMES  DAILY AS NEEDED FOR LEG PAIN (Patient taking differently: Take 300-600 mg by mouth See admin instructions. TAKE 2 CAPSULES (600 MG) IN THE MORNING, 2 CAPSULES (600 MG) IN THE EVENING, & 1 CAPSULE (300 MG) BY MOUTH AT BEDTIME.) 540 capsule 3  . insulin NPH Human (NOVOLIN N) 100 UNIT/ML injection Inject 6-8 Units into the skin See admin instructions. Inject 8 units subcutaneously in the morning and inject 6  units subcutaneously in the evening.    . insulin regular (NOVOLIN R) 100 units/mL injection Inject 6 Units into the skin daily before supper.     . Investigational - Study Medication Take 1 tablet by mouth daily. Study name: [place] Additional study details: MK-1242 (vericiguat) or Placebo 1 each PRN  . ipratropium (ATROVENT) 0.06 % nasal spray Place 2 sprays into both nostrils daily.     Marland Kitchen lidocaine (LIDODERM) 5 % Place 1 patch onto the skin daily as needed (for pain.).     Marland Kitchen methocarbamol (ROBAXIN) 750 MG tablet Take 1 tablet (750 mg total) by mouth 2 (two) times daily as needed for muscle spasms. 90 tablet 1  . metroNIDAZOLE (METROGEL) 0.75 % gel Apply 1 application topically daily as needed. FOR ROSACEA  2  . nitroGLYCERIN (NITROSTAT) 0.4 MG SL tablet Place 1 tablet (0.4 mg total) under the tongue every 5 (five) minutes as needed for chest pain (MAX 3 TABLETS). 25 tablet 3  . omeprazole (PRILOSEC) 20 MG capsule Take 2 capsules (40 mg total) by mouth 2 (two) times daily. 120 capsule 11  . potassium chloride SA (K-DUR,KLOR-CON) 20 MEQ tablet Take 1 tablet (20 mEq total) by mouth daily. 90 tablet 3  . sacubitril-valsartan (ENTRESTO) 24-26 MG Take 1 tablet by mouth 2 (two) times daily. 60 tablet 11  . torsemide (DEMADEX) 20 MG tablet Take 4 tablets (80 mg total) by mouth 2 (two) times daily.    . traMADol (ULTRAM) 50 MG tablet TAKE TWO TABLETS BY MOUTH EVERY 8 HOURS AS NEEDED FOR PAIN. 180 tablet 1  . warfarin (COUMADIN) 2.5 MG tablet TAKE AS DIRECTED BY ADMIN INSTRUCTIONS (Patient taking  differently: TAKE 2 TABLETS (5 MG) BY MOUTH ON TUESDAYS AND SATURDAYS, THEN TAKE 1 TABLET (2.5 MG) BY MOUTH ON ALL OTHER DAYS.) 120 tablet 1   No current facility-administered medications for this encounter.     Vitals:   09/06/17 0931  BP: 138/67  Pulse: (!) 56  SpO2: 100%  Weight: 230 lb 2 oz (104.4 kg)    PHYSICAL EXAM: General: NAD Neck: No JVD, no thyromegaly or thyroid nodule.  Lungs: Clear to auscultation bilaterally with normal respiratory effort. CV: Nondisplaced PMI.  Heart irregular S1/S2, no S3/S4, no murmur.  1+ ankle edema.  No carotid bruit.  Normal pedal pulses.  Abdomen: Soft, nontender, no hepatosplenomegaly, no distention.  Skin: Intact without lesions or rashes.  Neurologic: Alert and oriented x 3.  Psych: Normal affect. Extremities: No clubbing or cyanosis.  HEENT: Normal.    ASSESSMENT & PLAN: 1. Chronic Systolic Heart Failure: Ischemic cardiomyopathy. Echo 4/19 with EF 40-45%, some improvement. Medtronic CRT-D.  NYHA class II.   Exam and Optivol do not suggest volume overload and weight is down, but PADP has been steadily rising by CardioMems.  I am concerned that his CardioMems readings may not be accurate and that we are at risk for over-diuresing him and impairing his renal function.  - Continue torsemide 80 mg bid for now.  However, I will arrange for RHC to assess volume status.  We will not use CardioMems until RHC is done.  I discussed risks/benefits of procedure and he agrees.   - Continue digoxin, check level today.  - Continue current Entresto, unable to increase due to significant lightheadedness on higher dose.  - Unable to take eplerenone or spironolactone due to breast tenderness.  This has mostly resolved off these meds.   - Increase Coreg to 25 mg bid.  -  BMET today.  2. Atrial fibrillation: Permanent.  Rate-controlled.  Continue warfarin.   3. CAD: s/p CABG.  No chest pain.  - No ASA given warfarin use.  - Continue statin.  Good lipids 4/19.    4. CKD Stage III: Recent creatinine 1.7.  5. Gout: stable. Continue allopurinol.  6. Ascending aortic aneurysm: 5/19 CT chest with 5.2 cm ascending aorta.  He saw Dr. Servando Snare.  Aneurysm has been relatively stable, would be high risk for operation.  For now, will monitor with no plans for surgery.   - Keep SBP < 130 if possible, increasing Coreg as above.  - Will get repeat CT chest w/o contrast in 11/19.    Followup in 2 months.   Loralie Champagne  09/06/2017

## 2017-09-06 NOTE — Progress Notes (Signed)
Medication Samples have been provided to the patient.  Drug name: Delene Loll       Strength: 24/26        Qty: 2 LOT: [id number]  Exp.Date: 06/21  Dosing instructions: Take 1 tab, twice a day  The patient has been instructed regarding the correct time, dose, and frequency of taking this medication, including desired effects and most common side effects.   Shirley Muscat 10:47 AM 09/06/2017

## 2017-09-07 ENCOUNTER — Other Ambulatory Visit (HOSPITAL_COMMUNITY): Payer: Self-pay

## 2017-09-07 DIAGNOSIS — I5022 Chronic systolic (congestive) heart failure: Secondary | ICD-10-CM

## 2017-09-22 ENCOUNTER — Telehealth: Payer: Self-pay

## 2017-09-22 NOTE — Telephone Encounter (Signed)
Contacted patient for Final phone visit in the [place] research study.  No complaints and no ae's or sae's to report.

## 2017-09-28 ENCOUNTER — Ambulatory Visit (HOSPITAL_COMMUNITY)
Admission: RE | Admit: 2017-09-28 | Discharge: 2017-09-28 | Disposition: A | Discharge status: Home / Self Care | Payer: Medicare Other | Source: Ambulatory Visit | Attending: Cardiology | Admitting: Cardiology

## 2017-09-28 ENCOUNTER — Other Ambulatory Visit: Payer: Self-pay | Admitting: Cardiology

## 2017-09-28 ENCOUNTER — Encounter (HOSPITAL_COMMUNITY)
Admission: RE | Disposition: A | Discharge status: Home / Self Care | Payer: Self-pay | Source: Ambulatory Visit | Attending: Cardiology

## 2017-09-28 DIAGNOSIS — I5022 Chronic systolic (congestive) heart failure: Secondary | ICD-10-CM | POA: Insufficient documentation

## 2017-09-28 DIAGNOSIS — Z8679 Personal history of other diseases of the circulatory system: Secondary | ICD-10-CM | POA: Insufficient documentation

## 2017-09-28 DIAGNOSIS — E785 Hyperlipidemia, unspecified: Secondary | ICD-10-CM | POA: Diagnosis not present

## 2017-09-28 DIAGNOSIS — Z794 Long term (current) use of insulin: Secondary | ICD-10-CM | POA: Diagnosis not present

## 2017-09-28 DIAGNOSIS — Z7901 Long term (current) use of anticoagulants: Secondary | ICD-10-CM | POA: Insufficient documentation

## 2017-09-28 DIAGNOSIS — Z823 Family history of stroke: Secondary | ICD-10-CM | POA: Diagnosis not present

## 2017-09-28 DIAGNOSIS — Z951 Presence of aortocoronary bypass graft: Secondary | ICD-10-CM | POA: Diagnosis not present

## 2017-09-28 DIAGNOSIS — I255 Ischemic cardiomyopathy: Secondary | ICD-10-CM | POA: Insufficient documentation

## 2017-09-28 DIAGNOSIS — I2510 Atherosclerotic heart disease of native coronary artery without angina pectoris: Secondary | ICD-10-CM | POA: Diagnosis not present

## 2017-09-28 DIAGNOSIS — I712 Thoracic aortic aneurysm, without rupture: Secondary | ICD-10-CM | POA: Insufficient documentation

## 2017-09-28 DIAGNOSIS — I130 Hypertensive heart and chronic kidney disease with heart failure and stage 1 through stage 4 chronic kidney disease, or unspecified chronic kidney disease: Secondary | ICD-10-CM | POA: Insufficient documentation

## 2017-09-28 DIAGNOSIS — I482 Chronic atrial fibrillation: Secondary | ICD-10-CM | POA: Insufficient documentation

## 2017-09-28 DIAGNOSIS — Z833 Family history of diabetes mellitus: Secondary | ICD-10-CM | POA: Diagnosis not present

## 2017-09-28 DIAGNOSIS — Z8249 Family history of ischemic heart disease and other diseases of the circulatory system: Secondary | ICD-10-CM | POA: Insufficient documentation

## 2017-09-28 DIAGNOSIS — E1122 Type 2 diabetes mellitus with diabetic chronic kidney disease: Secondary | ICD-10-CM | POA: Insufficient documentation

## 2017-09-28 DIAGNOSIS — Z79899 Other long term (current) drug therapy: Secondary | ICD-10-CM | POA: Insufficient documentation

## 2017-09-28 DIAGNOSIS — Z87891 Personal history of nicotine dependence: Secondary | ICD-10-CM | POA: Diagnosis not present

## 2017-09-28 DIAGNOSIS — I509 Heart failure, unspecified: Secondary | ICD-10-CM | POA: Diagnosis not present

## 2017-09-28 DIAGNOSIS — M109 Gout, unspecified: Secondary | ICD-10-CM | POA: Diagnosis not present

## 2017-09-28 DIAGNOSIS — N183 Chronic kidney disease, stage 3 (moderate): Secondary | ICD-10-CM | POA: Insufficient documentation

## 2017-09-28 HISTORY — PX: RIGHT HEART CATH: CATH118263

## 2017-09-28 LAB — CBC
HEMATOCRIT: 40.4 % (ref 39.0–52.0)
Hemoglobin: 12.7 g/dL — ABNORMAL LOW (ref 13.0–17.0)
MCH: 28.1 pg (ref 26.0–34.0)
MCHC: 31.4 g/dL (ref 30.0–36.0)
MCV: 89.4 fL (ref 78.0–100.0)
PLATELETS: 128 10*3/uL — AB (ref 150–400)
RBC: 4.52 MIL/uL (ref 4.22–5.81)
RDW: 18.3 % — ABNORMAL HIGH (ref 11.5–15.5)
WBC: 5.4 10*3/uL (ref 4.0–10.5)

## 2017-09-28 LAB — POCT I-STAT 3, VENOUS BLOOD GAS (G3P V)
ACID-BASE EXCESS: 1 mmol/L (ref 0.0–2.0)
Acid-Base Excess: 1 mmol/L (ref 0.0–2.0)
Bicarbonate: 26.3 mmol/L (ref 20.0–28.0)
Bicarbonate: 26.5 mmol/L (ref 20.0–28.0)
O2 Saturation: 63 %
O2 Saturation: 63 %
PCO2 VEN: 44.6 mmHg (ref 44.0–60.0)
PCO2 VEN: 44.8 mmHg (ref 44.0–60.0)
PH VEN: 7.379 (ref 7.250–7.430)
PH VEN: 7.38 (ref 7.250–7.430)
PO2 VEN: 34 mmHg (ref 32.0–45.0)
TCO2: 28 mmol/L (ref 22–32)
TCO2: 28 mmol/L (ref 22–32)
pO2, Ven: 34 mmHg (ref 32.0–45.0)

## 2017-09-28 LAB — BASIC METABOLIC PANEL
ANION GAP: 11 (ref 5–15)
BUN: 33 mg/dL — ABNORMAL HIGH (ref 8–23)
CO2: 30 mmol/L (ref 22–32)
Calcium: 8.6 mg/dL — ABNORMAL LOW (ref 8.9–10.3)
Chloride: 100 mmol/L (ref 98–111)
Creatinine, Ser: 1.86 mg/dL — ABNORMAL HIGH (ref 0.61–1.24)
GFR calc Af Amer: 39 mL/min — ABNORMAL LOW (ref >60)
GFR, EST NON AFRICAN AMERICAN: 34 mL/min — AB (ref >60)
GLUCOSE: 142 mg/dL — AB (ref 70–99)
POTASSIUM: 3.8 mmol/L (ref 3.5–5.1)
Sodium: 141 mmol/L (ref 135–145)

## 2017-09-28 LAB — PROTIME-INR
INR: 1.68
Prothrombin Time: 19.6 seconds — ABNORMAL HIGH (ref 11.4–15.2)

## 2017-09-28 LAB — GLUCOSE, CAPILLARY
GLUCOSE-CAPILLARY: 127 mg/dL — AB (ref 70–99)
GLUCOSE-CAPILLARY: 127 mg/dL — AB (ref 70–99)

## 2017-09-28 SURGERY — RIGHT HEART CATH
Anesthesia: LOCAL

## 2017-09-28 MED ORDER — SODIUM CHLORIDE 0.9% FLUSH: 3.0000 mL | Freq: Two times a day (BID) | INTRAVENOUS | Status: DC

## 2017-09-28 MED ORDER — SODIUM CHLORIDE 0.9 % IV SOLN: 250.0000 mL | INTRAVENOUS | Status: DC | PRN

## 2017-09-28 MED ORDER — LIDOCAINE HCL (PF) 1 % IJ SOLN
INTRAMUSCULAR | Status: DC | PRN
  Administered 2017-09-28: 2 mL via SUBCUTANEOUS

## 2017-09-28 MED ORDER — SODIUM CHLORIDE 0.9% FLUSH: 3.0000 mL | INTRAVENOUS | Status: DC | PRN

## 2017-09-28 MED ORDER — HEPARIN (PORCINE) IN NACL 1000-0.9 UT/500ML-% IV SOLN
INTRAVENOUS | Status: AC
  Filled 2017-09-28: qty 500

## 2017-09-28 MED ORDER — SODIUM CHLORIDE 0.9 % IV SOLN
INTRAVENOUS | Status: DC
  Administered 2017-09-28: 07:00:00 via INTRAVENOUS

## 2017-09-28 MED ORDER — LIDOCAINE HCL (PF) 1 % IJ SOLN
INTRAMUSCULAR | Status: AC
  Filled 2017-09-28: qty 30

## 2017-09-28 MED ORDER — HEPARIN (PORCINE) IN NACL 1000-0.9 UT/500ML-% IV SOLN
INTRAVENOUS | Status: DC | PRN
  Administered 2017-09-28: 500 mL

## 2017-09-28 MED ORDER — ACETAMINOPHEN 325 MG PO TABS: 650.0000 mg | ORAL_TABLET | ORAL | Status: DC | PRN

## 2017-09-28 MED ORDER — ONDANSETRON HCL 4 MG/2ML IJ SOLN: 4.0000 mg | Freq: Four times a day (QID) | INTRAMUSCULAR | Status: DC | PRN

## 2017-09-28 SURGICAL SUPPLY — 7 items
CATH BALLN WEDGE 5F 110CM (CATHETERS) ×1 IMPLANT
GUIDEWIRE .025 260CM (WIRE) ×2 IMPLANT
PACK CARDIAC CATHETERIZATION (CUSTOM PROCEDURE TRAY) ×2 IMPLANT
SHEATH RAIN 4/5FR (SHEATH) ×1 IMPLANT
TRANSDUCER W/STOPCOCK (MISCELLANEOUS) ×2 IMPLANT
TUBING ART PRESS 72  MALE/FEM (TUBING) ×1
TUBING ART PRESS 72 MALE/FEM (TUBING) IMPLANT

## 2017-09-28 NOTE — Discharge Instructions (Signed)
Brachial Site Care Refer to this sheet in the next few weeks. These instructions provide you with information about caring for yourself after your procedure. Your health care provider may also give you more specific instructions. Your treatment has been planned according to current medical practices, but problems sometimes occur. Call your health care provider if you have any problems or questions after your procedure. What can I expect after the procedure? After your procedure, it is typical to have the following:  Bruising at the brachial site that usually fades within 1-2 weeks.  Blood collecting in the tissue (hematoma) that may be painful to the touch. It should usually decrease in size and tenderness within 1-2 weeks.  Follow these instructions at home:  Take medicines only as directed by your health care provider.  You may shower 24-48 hours after the procedure or as directed by your health care provider. Remove the bandage (dressing) and gently wash the site with plain soap and water. Pat the area dry with a clean towel. Do not rub the site, because this may cause bleeding.  Do not take baths, swim, or use a hot tub until your health care provider approves.  Check your insertion site every day for redness, swelling, or drainage.  Do not apply powder or lotion to the site.  Do not flex or bend the affected arm for 24 hours or as directed by your health care provider.  Do not push or pull heavy objects with the affected arm for 24 hours or as directed by your health care provider.  Do not lift over 10 lb (4.5 kg) for 5 days after your procedure or as directed by your health care provider.  Ask your health care provider when it is okay to: ? Return to work or school. ? Resume usual physical activities or sports. ? Resume sexual activity.  Do not drive home if you are discharged the same day as the procedure. Have someone else drive you.  You may drive 24 hours after the  procedure unless otherwise instructed by your health care provider.  Do not operate machinery or power tools for 24 hours after the procedure.  If your procedure was done as an outpatient procedure, which means that you went home the same day as your procedure, a responsible adult should be with you for the first 24 hours after you arrive home.  Keep all follow-up visits as directed by your health care provider. This is important. Contact a health care provider if:  You have a fever.  You have chills.  You have increased bleeding from the brachial site. Hold pressure on the site. Get help right away if:  You have unusual pain at the brachial site.  You have redness, warmth, or swelling at the radial site.  You have drainage (other than a small amount of blood on the dressing) from the brachial site.  The brachial site is bleeding, and the bleeding does not stop after 30 minutes of holding steady pressure on the site.  Your arm or hand becomes pale, cool, tingly, or numb. This information is not intended to replace advice given to you by your health care provider. Make sure you discuss any questions you have with your health care provider. Document Released: 04/09/2010 Document Revised: 08/13/2015 Document Reviewed: 09/23/2013 Elsevier Interactive Patient Education  2018 [place] COUMADIN THIS EVENING

## 2017-09-28 NOTE — Interval H&P Note (Signed)
History and Physical Interval Note:  09/28/2017 7:34 AM  [name]  has presented today for surgery, with the diagnosis of hf  The various methods of treatment have been discussed with the patient and family. After consideration of risks, benefits and other options for treatment, the patient has consented to  Procedure(s): RIGHT HEART CATH (N/A) as a surgical intervention .  The patient's history has been reviewed, patient examined, no change in status, stable for surgery.  I have reviewed the patient's chart and labs.  Questions were answered to the patient's satisfaction.     Hendrik Donath [employer]

## 2017-09-28 NOTE — Progress Notes (Signed)
Skin tear noted to left forearm approx the size of a dime. Area cleaned and op site applied.

## 2017-09-29 ENCOUNTER — Encounter (HOSPITAL_COMMUNITY): Payer: Self-pay | Admitting: Cardiology

## 2017-10-05 ENCOUNTER — Ambulatory Visit: Payer: Medicare Other | Admitting: *Deleted

## 2017-10-05 DIAGNOSIS — I482 Chronic atrial fibrillation: Secondary | ICD-10-CM | POA: Diagnosis not present

## 2017-10-05 DIAGNOSIS — I4891 Unspecified atrial fibrillation: Secondary | ICD-10-CM | POA: Diagnosis not present

## 2017-10-05 DIAGNOSIS — I4821 Permanent atrial fibrillation: Secondary | ICD-10-CM

## 2017-10-05 LAB — POCT INR: INR: 2.5 (ref 2.0–3.0)

## 2017-10-05 NOTE — Patient Instructions (Signed)
Description   Continue 1 tablet every day except 2 tablets on Tuesdays and Saturdays.  Recheck in 6 weeks.  Call with any new medications of changes  [phone] [facility].

## 2017-10-09 ENCOUNTER — Encounter: Payer: Self-pay | Admitting: Internal Medicine

## 2017-10-09 MED ORDER — ATORVASTATIN CALCIUM 10 MG PO TABS: 10.0000 mg | ORAL_TABLET | Freq: Every day | ORAL | 2 refills | Status: AC

## 2017-10-10 ENCOUNTER — Telehealth: Payer: Self-pay | Admitting: Endocrinology

## 2017-10-10 NOTE — Telephone Encounter (Signed)
There is an active order for A1C to be drawn.

## 2017-10-10 NOTE — Telephone Encounter (Signed)
Patient wife called and asked about lab work that patient will be having tomorrow. She would like to make sure that an A1C has been order if it has not they are requesting to have this order  Please advise

## 2017-10-11 ENCOUNTER — Other Ambulatory Visit (INDEPENDENT_AMBULATORY_CARE_PROVIDER_SITE_OTHER): Payer: Medicare Other

## 2017-10-11 DIAGNOSIS — E1142 Type 2 diabetes mellitus with diabetic polyneuropathy: Secondary | ICD-10-CM

## 2017-10-11 LAB — COMPREHENSIVE METABOLIC PANEL
ALT: 10 U/L (ref 0–53)
AST: 10 U/L (ref 0–37)
Albumin: 3.5 g/dL (ref 3.5–5.2)
Alkaline Phosphatase: 64 U/L (ref 39–117)
BUN: 36 mg/dL — ABNORMAL HIGH (ref 6–23)
CO2: 29 mEq/L (ref 19–32)
Calcium: 8.8 mg/dL (ref 8.4–10.5)
Chloride: 101 mEq/L (ref 96–112)
Creatinine, Ser: 1.62 mg/dL — ABNORMAL HIGH (ref 0.40–1.50)
GFR: 44.19 mL/min — ABNORMAL LOW (ref >60.00)
Glucose, Bld: 159 mg/dL — ABNORMAL HIGH (ref 70–99)
Potassium: 3.8 mEq/L (ref 3.5–5.1)
Sodium: 139 mEq/L (ref 135–145)
Total Bilirubin: 0.6 mg/dL (ref 0.2–1.2)
Total Protein: 6.1 g/dL (ref 6.0–8.3)

## 2017-10-11 LAB — BASIC METABOLIC PANEL
BUN: 36 mg/dL — ABNORMAL HIGH (ref 6–23)
CO2: 29 mEq/L (ref 19–32)
Calcium: 8.8 mg/dL (ref 8.4–10.5)
Chloride: 101 mEq/L (ref 96–112)
Creatinine, Ser: 1.62 mg/dL — ABNORMAL HIGH (ref 0.40–1.50)
GFR: 44.19 mL/min — ABNORMAL LOW (ref >60.00)
Glucose, Bld: 159 mg/dL — ABNORMAL HIGH (ref 70–99)
POTASSIUM: 3.8 meq/L (ref 3.5–5.1)
Sodium: 139 mEq/L (ref 135–145)

## 2017-10-11 LAB — HEMOGLOBIN A1C: Hgb A1c MFr Bld: 6.7 % — ABNORMAL HIGH (ref 4.6–6.5)

## 2017-10-16 ENCOUNTER — Encounter: Payer: Self-pay | Admitting: Endocrinology

## 2017-10-16 ENCOUNTER — Ambulatory Visit: Payer: Medicare Other | Admitting: Endocrinology

## 2017-10-16 VITALS — BP 124/68 | HR 85 | Ht 72.0 in | Wt 228.2 lb

## 2017-10-16 DIAGNOSIS — Z794 Long term (current) use of insulin: Secondary | ICD-10-CM

## 2017-10-16 DIAGNOSIS — E1159 Type 2 diabetes mellitus with other circulatory complications: Secondary | ICD-10-CM

## 2017-10-16 DIAGNOSIS — E1142 Type 2 diabetes mellitus with diabetic polyneuropathy: Secondary | ICD-10-CM

## 2017-10-16 LAB — MICROALBUMIN / CREATININE URINE RATIO
CREATININE, U: 35.8 mg/dL
MICROALB UR: 6.3 mg/dL — AB (ref 0.0–1.9)
MICROALB/CREAT RATIO: 17.7 mg/g (ref 0.0–30.0)

## 2017-10-16 NOTE — Progress Notes (Signed)
Patient ID: ANTAEUS KAREL, male   DOB: 1940-07-01, 77 y.o.   MRN: [id number]    Reason for Appointment : Follow-up for Type 2 Diabetes  History of Present Illness          Diagnosis: Type 2 diabetes mellitus, date of diagnosis: 2008       Past history: He was initially treated with Metformin and also given Amayl at some point. With this he had fair control of his diabetes with A1c had usually been over 7% except once in 2013.  He was taken off metformin probably in March this year after a hospitalization, possibly because of renal dysfunction He was started on Levemir insulin in May when his blood sugars were significantly higher, fasting readings averaging 220.  Levemir progressively increased and was on 35 units at night since 12/20/12 He has not been on any other medications either orally or injectable for his diabetes Because of his A1c of over 10% in 10/14 on basal insulin alone he was started on NovoLog with each meal in 10/14  Recent history:     INSULIN regimen is described as: Novolin N 8 units in the morning and 6 at bedtime, Regular Insulin 6 units at supper  A1c is now 6.7 and improved     Current management, blood sugar patterns and problems identified:  He is taking mealtime coverage only at suppertime  Again with small doses of insulin his blood sugars are excellent, averaging 128 at home  He does check his sugars after meals very consistently  Although his sugars are higher after lunch sometimes on average they are excellent without any mealtime coverage  Currently he has started doing better with cutting back on portions and carbohydrates and is losing weight  FASTING readings are very consistent just over 100 usually  No hypoglycemia  Compliance with the medical regimen: Good   Glucose monitoring:  done Up to 2 times a day        Glucometer:  Accu-Chek    Blood Glucose readings from download:  Mean values apply above for all meters except  median for One Touch  PRE-MEAL Fasting Lunch Dinner Bedtime Overall  Glucose range:  106-144      Mean/median: 121    128   POST-MEAL PC Breakfast PC Lunch PC Dinner  Glucose range:   115-170  85-155  Mean/median:   144  122   PREVIOUS readings  PRE-MEAL Fasting Lunch Dinner Bedtime Overall  Glucose range:      97-178  Mean/median:  125    130   POST-MEAL PC Breakfast PC Lunch PC Dinner  Glucose range:     Mean/median:   130  135     Self-care: The diet that the patient has been following is: Smaller portions and relatively balanced meals  Meals: 2 meals per day. Noon and 6 pm       Physical activity: exercise: walks a little now, limited by back pain              Dietician visit: Most recent: Unknown.  CDE visit: 11/14                Weight control:    Wt Readings from Last 3 Encounters:  10/16/17 228 lb 3.2 oz (103.5 kg)  09/28/17 227 lb (103 kg)  09/06/17 230 lb 2 oz (104.4 kg)    Lab Results  Component Value Date   HGBA1C 6.7 (H) 10/11/2017   HGBA1C 7.0 (H)  07/03/2017   HGBA1C 6.8 (H) 04/11/2017   Lab Results  Component Value Date   MICROALBUR 3.3 (H) 09/02/2016   LDLCALC 52 07/03/2017   CREATININE 1.62 (H) 10/11/2017   CREATININE 1.62 (H) 10/11/2017    Other active problems: See review of systems   Allergies as of 10/16/2017      Reactions   Avelox [moxifloxacin Hcl In Nacl] Swelling, Rash, Other (See Comments)   Patient became hypotensive after infusion started Because of a history of documented adverse serious drug reaction;Medi Alert bracelet  is recommended   Penicillins Anaphylaxis, Swelling, Other (See Comments)   REACTION: anaphylaxis Because of a history of documented adverse serious drug reaction;Medi Alert bracelet  is recommended Has patient had a PCN reaction causing immediate rash, facial/tongue/throat swelling, SOB or lightheadedness with hypotension: Yes Has patient had a PCN reaction causing severe rash involving mucus membranes or  skin necrosis: No Has patient had a PCN reaction that required hospitalization: Unknown Has patient had a PCN reaction occurring within the last 10 years: No      Medication List        Accurate as of 10/16/17  1:12 PM. Always use your most recent med list.          ACCU-CHEK AVIVA PLUS test strip Generic drug:  glucose blood USE 1 STRIP TO CHECK GLUCOSE THREE TIMES DAILY AS DIRECTED   ACCU-CHEK FASTCLIX LANCETS Misc USE ONE LANCET TO CHECK GLUCOSE THREE TIMES DAILY   acetaminophen 325 MG tablet Commonly known as:  TYLENOL Take 650-975 mg by mouth 4 (four) times daily as needed for moderate pain.   allopurinol 100 MG tablet Commonly known as:  ZYLOPRIM TAKE TWO TABLETS BY MOUTH ONCE DAILY   atorvastatin 10 MG tablet Commonly known as:  LIPITOR Take 1 tablet (10 mg total) by mouth daily.   carboxymethylcellulose 0.5 % Soln Commonly known as:  REFRESH PLUS Place 2 drops into both eyes daily.   carvedilol 25 MG tablet Commonly known as:  COREG Take 1 tablet (25 mg total) by mouth 2 (two) times daily with a meal.   cholecalciferol 1000 units tablet Commonly known as:  VITAMIN D Take 1,000 Units by mouth daily.   colchicine 0.6 MG tablet Take 0.6 mg by mouth daily as needed (gout flares).   digoxin 0.125 MG tablet Commonly known as:  LANOXIN Take 0.5 tablets (0.0625 mg total) by mouth daily.   DULoxetine 30 MG capsule Commonly known as:  CYMBALTA Take 1 capsule (30 mg total) by mouth daily.   gabapentin 300 MG capsule Commonly known as:  NEURONTIN Take 2 capsules (600 mg total) by mouth 3 (three) times daily. TAKE ONE CAPSULE BY MOUTH 5 TIMES DAILY AS NEEDED FOR LEG PAIN   Investigational - Study Medication Take 1 tablet by mouth daily. Study name: [place] Additional study details: MK-1242 (vericiguat) or Placebo   ipratropium 0.06 % nasal spray Commonly known as:  ATROVENT Place 2 sprays into both nostrils daily.   lidocaine 5 % Commonly known as:   LIDODERM Place 1 patch onto the skin daily as needed (for pain.).   methocarbamol 750 MG tablet Commonly known as:  ROBAXIN Take 1 tablet (750 mg total) by mouth 2 (two) times daily as needed for muscle spasms.   metroNIDAZOLE 0.75 % gel Commonly known as:  METROGEL Apply 1 application topically daily as needed. FOR ROSACEA   nitroGLYCERIN 0.4 MG SL tablet Commonly known as:  NITROSTAT Place 1 tablet (0.4 mg total) under  the tongue every 5 (five) minutes as needed for chest pain (MAX 3 TABLETS).   NOVOLIN N 100 UNIT/ML injection Generic drug:  insulin NPH Human Inject 6-8 Units into the skin See admin instructions. Inject 8 units subcutaneously in the morning and inject 6 units subcutaneously in the evening.   NOVOLIN R 100 units/mL injection Generic drug:  insulin regular Inject 6 Units into the skin daily before supper.   omeprazole 20 MG capsule Commonly known as:  PRILOSEC Take 2 capsules (40 mg total) by mouth 2 (two) times daily.   potassium chloride SA 20 MEQ tablet Commonly known as:  K-DUR,KLOR-CON Take 1 tablet (20 mEq total) by mouth daily.   sacubitril-valsartan 24-26 MG Commonly known as:  ENTRESTO Take 1 tablet by mouth 2 (two) times daily.   torsemide 20 MG tablet Commonly known as:  DEMADEX Take 4 tablets (80 mg total) by mouth 2 (two) times daily.   traMADol 50 MG tablet Commonly known as:  ULTRAM TAKE TWO TABLETS BY MOUTH EVERY 8 HOURS AS NEEDED FOR PAIN.   warfarin 2.5 MG tablet Commonly known as:  COUMADIN Take as directed by the anticoagulation clinic. If you are unsure how to take this medication, talk to your nurse or doctor. Original instructions:  TAKE AS DIRECTED BY ADMIN INSTRUCTIONS       Allergies:  Allergies  Allergen Reactions  . Avelox [Moxifloxacin Hcl In Nacl] Swelling, Rash and Other (See Comments)    Patient became hypotensive after infusion started Because of a history of documented adverse serious drug reaction;Medi Alert  bracelet  is recommended  . Penicillins Anaphylaxis, Swelling and Other (See Comments)    REACTION: anaphylaxis Because of a history of documented adverse serious drug reaction;Medi Alert bracelet  is recommended Has patient had a PCN reaction causing immediate rash, facial/tongue/throat swelling, SOB or lightheadedness with hypotension: Yes Has patient had a PCN reaction causing severe rash involving mucus membranes or skin necrosis: No Has patient had a PCN reaction that required hospitalization: Unknown Has patient had a PCN reaction occurring within the last 10 years: No     Past Medical History:  Diagnosis Date  . AAA (abdominal aortic aneurysm) ([place])    Surgical repair 11/2002.  . Adenomatous colon polyp   . Alcohol ingestion of more than four drinks per week    Excess beer  not a dependency problem  . Aortic valve sclerosis   . Arthritis   . Atrial fibrillation ([place])    Previous long-term amiodarone therapy with multiple cardioversions / amiodarone stopped September, 2009  . Atrial flutter [name])    Started November, 2010, Left-sided and cannot ablate  . Bony abnormality    Patient's manubrium is slightly displaced to the right  . CAD (coronary artery disease)    a. s/p CABG 2004;  b. 02/2016 Cath: LM nl, LAD 70p, 121m, D1 50, D2 50, RI 50ost, LCX 45m, OM2 100, OM3 100, RCA 70p, 100d, VG->OM3 ok, LIMA->LAD 60ost, VG->RPDA  ok.  . 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 01/2013 MDT DTBA 1D1 [place] CRT-D, ser # [id number].  . Carotid artery disease ([place])    Doppler, December, 2013, 0-39% bilateral  . Chronic systolic CHF (congestive heart failure) ([place])    a. 02/2016 Echo: EF 35-40%, diff HK, triv AI, mildly dil Ao root 54mm, mild MR, sev dil LA, triv TR.  Marland Kitchen Chronotropic incompetence    IV pacing rate adjusted  . CKD (chronic kidney disease),  stage III ([place])   . COPD (chronic obstructive pulmonary disease) ([place])   . Dilated aortic root ([place])     . Discolored skin   . Diverticulosis   . Drug therapy    Redness and swelling with Avelox infusion May 24, 2011  . Eye abnormality    Ophthalmologist questions a clot in one of his eyes, May, 2012  . Gout   . Hyperlipidemia   . Hypertension   . Internal hemorrhoids   . Ischemic cardiomyopathy   . Left atrial thrombus    Remote past... cardioversions done since that time  . Mitral regurgitation    Mild echo  . Myocardial infarction ([place])   . Nasal drainage    Chronic  . Overweight(278.02)   . Pericardial effusion   . Pleural effusion    Large loculated effusion on the left side November, 2011. This was tapped. It was exudative. Cytology revealed no cancer no proof of mesothelioma area pulmonary team felt that no further workup was needed  . Pleural thickening   . Pneumonia   . Polycythemia vera ([place]) 07/28/2014  . SOB (shortness of breath)    Large left effusion/ thoracentesis/hospitalization/November, 2011... exudated.. cytology negative.. Dr.Wert.. no proof of mesothelioma  . Spinal stenosis    Surgery Dr.Elsner  . Thrombophlebitis of superficial veins of upper extremities    Possible venous stenosis from defibrillator  . Type II diabetes mellitus ([place]) 2008  . Venous insufficiency    Toe discoloration chronic  . Ventral hernia    April, 2014, result of his abdominal surgery  . Warfarin anticoagulation   . Wide-complex tachycardia ([place])     Past Surgical History:  Procedure Laterality Date  . ABDOMINAL AORTIC ANEURYSM REPAIR  11/2002  . ANTERIOR CERVICAL DECOMP/DISCECTOMY FUSION  1995  . BACK SURGERY    . BI-VENTRICULAR PACEMAKER INSERTION (CRT-P)  02-11-2013   Pt with previously implanted MDT CRTD downgraded to CRTP by Dr Lovena Le 02-11-13  . CARDIAC CATHETERIZATION  "several"  . CARDIAC CATHETERIZATION N/A 03/18/2016   Procedure: RIGHT/LEFT HEART CATH AND CORONARY/GRAFT ANGIOGRAPHY;  Surgeon: Nelva Bush, MD;  Location: [place] CV LAB;  Service:  Cardiovascular;  Laterality: N/A;  . CATARACT EXTRACTION W/ INTRAOCULAR LENS  IMPLANT, BILATERAL    . COLONOSCOPY W/ POLYPECTOMY    . CORONARY ANGIOPLASTY WITH STENT PLACEMENT  2004  . CORONARY ARTERY BYPASS GRAFT  2004   CABG X4  . FRACTURE SURGERY    . IMPLANTABLE CARDIOVERTER DEFIBRILLATOR (ICD) GENERATOR CHANGE N/A 02/11/2013   Procedure: ICD GENERATOR CHANGE;  Surgeon: Evans Lance, MD;  Location: [facility] CATH LAB;  Service: Cardiovascular;  Laterality: N/A;  . INCISION AND DRAINAGE ABSCESS / HEMATOMA OF BURSA / KNEE / THIGH    . INSERT / REPLACE / REMOVE PACEMAKER  2009   original pacer/defibrillator; Dr. Lovena Le 2009... by the pacing  . LUMBAR FUSION  2010  . ORIF TIBIA & FIBULA FRACTURES Right 2000s  . PILONIDAL CYST EXCISION    . RIGHT HEART CATH N/A 10/19/2016   Procedure: Right Heart Cath;  Surgeon: [name], MD;  Location: [place] CV LAB;  Service: Cardiovascular;  Laterality: N/A;  . RIGHT HEART CATH N/A 09/28/2017   Procedure: RIGHT HEART CATH;  Surgeon: [name], MD;  Location: [place] CV LAB;  Service: Cardiovascular;  Laterality: N/A;  . SPINAL CORD STIMULATOR IMPLANT  12/2011  . SURGERY SCROTAL / TESTICULAR      Family History  Problem Relation Age of Onset  .  Hypertension Mother   . Stroke Mother   . Diabetes Father   . Coronary artery disease Father   . Other Father        DVT  . Diabetes Brother   . Colon cancer Neg Hx   . Heart attack Neg Hx     Social History:  reports that he quit smoking about 22 years ago. His smoking use included cigarettes. He started smoking about 61 years ago. He has a 117.00 pack-year smoking history. He has never used smokeless tobacco. He reports that he drinks about 1.2 oz of alcohol per week. He reports that he does not use drugs.    Review of Systems       Lipids: Treated with Lipitor and is taking 10 mg, Followed by PCP and cardiologist  LDL is under 70 consistently   Lab Results  Component Value Date    CHOL 119 07/03/2017   HDL 41.60 07/03/2017   LDLCALC 52 07/03/2017   TRIG 127.0 07/03/2017   CHOLHDL 3 07/03/2017       The blood pressure has been under control with current regimen of Spironolactone, Carvedilol  Followed closely by cardiology  Renal dysfunction: Creatinine has been fluctuating  Lab Results  Component Value Date   CREATININE 1.62 (H) 10/11/2017   CREATININE 1.62 (H) 10/11/2017   CREATININE 1.86 (H) 09/28/2017   CREATININE 1.45 (H) 09/06/2017   CREATININE 1.71 (H) 08/08/2017   CREATININE 1.45 (H) 06/20/2017      Has known neuropathy: He has had Long-standing numbness, pains and tingling in his feet and lower legs.   Numbness and discomfort is worse in the mornings but he is able to sleep without problems Since he was having more symptoms in 06/2017 he was given Cymbalta 30 mg in addition to his GABAPENTIN He has felt better with his symptoms Takes gabapentin also at the same doses of 2 tablet at lunch, 2 tablets at suppertime and 1 tablet at bedtime He is not wanting to try a higher dose of Cymbalta   Physical Examination:  BP 124/68 (BP Location: Left Arm, Patient Position: Sitting, Cuff Size: Normal)   Pulse 85   Ht 6' (1.829 m)   Wt 228 lb 3.2 oz (103.5 kg)   SpO2 94%   BMI 30.95 kg/m        ASSESSMENT/PLAN:  Diabetes type 2 on insulin See history of present illness for detailed discussion of his current management, blood sugar patterns and problems identified  His A1c is excellent at 6.7% Blood sugars are again well controlled with small doses of insulin mostly NPH  He is very compliant with his monitoring Also with cutting back on foods such as bread and portions he has lost nearly 20 pounds Although fasting readings are mildly increased overall they are still near normal and no overnight hypoglycemia at bedtime NPH  No change in insulin doses required Advised him to continue his excellent diet and walk as tolerated Check microalbumin  today  NEUROPATHY:  Neuropathic symptoms are better with Cymbalta in addition to his gabapentin Discussed that he can start taking higher doses of Cymbalta but he prefers not to go up Since his symptoms are most pronounced in the mornings he can try taking his Cymbalta at suppertime instead of morning  Follow-up in 4 months  There are no Patient Instructions on file for this visit.   Elayne Snare 10/16/2017, 1:12 PM

## 2017-10-16 NOTE — Patient Instructions (Signed)
Take Cymbalta at supper

## 2017-10-23 ENCOUNTER — Telehealth (HOSPITAL_COMMUNITY): Payer: Self-pay | Admitting: *Deleted

## 2017-10-23 NOTE — Telephone Encounter (Signed)
Threshold: 23-28  Reading: 29  Instructions: per Darrick Grinder, NP take extra Torsemide 20 mg daily.  Spoke w/pt's wife, she states pt has been having swelling in LE and she gave him an extra Torsemide yesterday, she will give him an extra today and let us know if swelling not improving, will continue to monitor cardiomems.

## 2017-10-27 ENCOUNTER — Telehealth (HOSPITAL_COMMUNITY): Payer: Self-pay | Admitting: *Deleted

## 2017-10-27 NOTE — Telephone Encounter (Signed)
Threshold: 23-28  Reading: 28  Instructions: per Dr Aundra Dubin take an extra 20 mg of Torsemide for 3 days, watch salt intake.  Pt's wife is aware and verbalized understanding, we again discussed salt intake and she states they did go out and ate seafood on Wed evening for dinner.

## 2017-10-30 ENCOUNTER — Other Ambulatory Visit (HOSPITAL_COMMUNITY): Payer: Self-pay | Admitting: Cardiology

## 2017-10-30 DIAGNOSIS — I482 Chronic atrial fibrillation, unspecified: Secondary | ICD-10-CM

## 2017-10-30 DIAGNOSIS — Z7901 Long term (current) use of anticoagulants: Secondary | ICD-10-CM

## 2017-11-01 ENCOUNTER — Ambulatory Visit (INDEPENDENT_AMBULATORY_CARE_PROVIDER_SITE_OTHER): Payer: Medicare Other | Admitting: *Deleted

## 2017-11-01 DIAGNOSIS — I5022 Chronic systolic (congestive) heart failure: Secondary | ICD-10-CM | POA: Diagnosis not present

## 2017-11-01 DIAGNOSIS — I255 Ischemic cardiomyopathy: Secondary | ICD-10-CM

## 2017-11-01 NOTE — Progress Notes (Signed)
Remote ICD transmission.   

## 2017-11-09 ENCOUNTER — Inpatient Hospital Stay: Payer: Medicare Other | Attending: Hematology & Oncology | Admitting: Hematology & Oncology

## 2017-11-09 ENCOUNTER — Inpatient Hospital Stay: Payer: Medicare Other

## 2017-11-09 ENCOUNTER — Other Ambulatory Visit: Payer: Self-pay

## 2017-11-09 VITALS — BP 114/56 | HR 51 | Temp 97.6°F | Resp 19 | Wt 223.0 lb

## 2017-11-09 DIAGNOSIS — I482 Chronic atrial fibrillation: Secondary | ICD-10-CM | POA: Diagnosis not present

## 2017-11-09 DIAGNOSIS — D45 Polycythemia vera: Secondary | ICD-10-CM | POA: Insufficient documentation

## 2017-11-09 DIAGNOSIS — Z7901 Long term (current) use of anticoagulants: Secondary | ICD-10-CM | POA: Diagnosis not present

## 2017-11-09 DIAGNOSIS — Z7982 Long term (current) use of aspirin: Secondary | ICD-10-CM | POA: Diagnosis not present

## 2017-11-09 LAB — CMP (CANCER CENTER ONLY)
ALK PHOS: 75 U/L (ref 26–84)
ALT: 12 U/L (ref 10–47)
AST: 15 U/L (ref 11–38)
Albumin: 3 g/dL — ABNORMAL LOW (ref 3.5–5.0)
Anion gap: 15 (ref 5–15)
BILIRUBIN TOTAL: 1 mg/dL (ref 0.2–1.6)
BUN: 35 mg/dL — ABNORMAL HIGH (ref 7–22)
CHLORIDE: 98 mmol/L (ref 98–108)
CO2: 31 mmol/L (ref 18–33)
Calcium: 8.8 mg/dL (ref 8.0–10.3)
Creatinine: 1.5 mg/dL — ABNORMAL HIGH (ref 0.60–1.20)
Glucose, Bld: 219 mg/dL — ABNORMAL HIGH (ref 73–118)
Potassium: 4.2 mmol/L (ref 3.3–4.7)
SODIUM: 144 mmol/L (ref 128–145)
Total Protein: 6.3 g/dL — ABNORMAL LOW (ref 6.4–8.1)

## 2017-11-09 LAB — CBC WITH DIFFERENTIAL (CANCER CENTER ONLY)
Basophils Absolute: 0 10*3/uL (ref 0.0–0.1)
Basophils Relative: 0 %
Eosinophils Absolute: 0.1 10*3/uL (ref 0.0–0.5)
Eosinophils Relative: 1 %
HEMATOCRIT: 45.1 % (ref 38.7–49.9)
HEMOGLOBIN: 14.8 g/dL (ref 13.0–17.1)
LYMPHS ABS: 0.9 10*3/uL (ref 0.9–3.3)
Lymphocytes Relative: 13 %
MCH: 29 pg (ref 28.0–33.4)
MCHC: 32.8 g/dL (ref 32.0–35.9)
MCV: 88.3 fL (ref 82.0–98.0)
Monocytes Absolute: 0.5 10*3/uL (ref 0.1–0.9)
Monocytes Relative: 8 %
NEUTROS PCT: 78 %
Neutro Abs: 5.1 10*3/uL (ref 1.5–6.5)
Platelet Count: 168 10*3/uL (ref 145–400)
RBC: 5.11 MIL/uL (ref 4.20–5.70)
RDW: 18.5 % — ABNORMAL HIGH (ref 11.1–15.7)
WBC: 6.5 10*3/uL (ref 4.0–10.0)

## 2017-11-09 NOTE — Progress Notes (Signed)
Hematology and Oncology Follow Up Visit  DONEL OSOWSKI [id number] 1941-01-06 77 y.o. 11/09/2017   Principle Diagnosis:  Polycythemia vera  Current Therapy:   Phlebotomy to maintain hematocrit below 45%. Aspirin 81 mg by mouth weekly Coumadin for chronic atrial fibrillation   Interim History:  Mr. Carvalho is here today with his wife for follow-up.  He actually looks quite good.  He has lost 20 pounds.  I think this is all fluid weight.  He has not been hospitalized since we last saw him.  His breathing is little bit rough in the summertime because of the humidity.  He is eating well.  He has had no problems with fever.  He has had no bleeding.  There is been no change in bowel or bladder habits.   His iron studies back in May showed a ferritin of 29 with iron saturation of 15%.  Overall, his performance status is ECOG 1-2.    Medications:  Allergies as of 11/09/2017      Reactions   Avelox [moxifloxacin Hcl In Nacl] Swelling, Rash, Other (See Comments)   Patient became hypotensive after infusion started Because of a history of documented adverse serious drug reaction;Medi Alert bracelet  is recommended   Other Rash, Swelling   Patient became hypotensive after infusion started Because of a history of documented adverse serious drug reaction;Medi Alert braceletis recommended   Penicillins Anaphylaxis, Swelling, Other (See Comments)   REACTION: anaphylaxis Because of a history of documented adverse serious drug reaction;Medi Alert bracelet  is recommended Has patient had a PCN reaction causing immediate rash, facial/tongue/throat swelling, SOB or lightheadedness with hypotension: Yes Has patient had a PCN reaction causing severe rash involving mucus membranes or skin necrosis: No Has patient had a PCN reaction that required hospitalization: Unknown Has patient had a PCN reaction occurring within the last 10 years: No REACTION: anaphylaxis Because of a history of  documented adverse serious drug reaction;Medi Alert braceletis recommended Has patient had a PCN reaction causing immediate rash, facial/tongue/throat swelling, SOB or lightheadedness with hypotension: Yes Has patient had a PCN reaction causing severe rash involving mucus membranes or skin necrosis: No Has patient had a PCN reaction that required hospitalization: Unknown Has patient had a PCN reaction occurring within the last 10 years: No      Medication List        Accurate as of 11/09/17  2:37 PM. Always use your most recent med list.          ACCU-CHEK AVIVA PLUS test strip Generic drug:  glucose blood USE 1 STRIP TO CHECK GLUCOSE THREE TIMES DAILY AS DIRECTED   ACCU-CHEK FASTCLIX LANCETS Misc USE ONE LANCET TO CHECK GLUCOSE THREE TIMES DAILY   acetaminophen 325 MG tablet Commonly known as:  TYLENOL Take 650-975 mg by mouth 4 (four) times daily as needed for moderate pain.   allopurinol 100 MG tablet Commonly known as:  ZYLOPRIM TAKE TWO TABLETS BY MOUTH ONCE DAILY   atorvastatin 10 MG tablet Commonly known as:  LIPITOR Take 1 tablet (10 mg total) by mouth daily.   carboxymethylcellulose 0.5 % Soln Commonly known as:  REFRESH PLUS Place 2 drops into both eyes daily.   carvedilol 25 MG tablet Commonly known as:  COREG Take 1 tablet (25 mg total) by mouth 2 (two) times daily with a meal.   cholecalciferol 1000 units tablet Commonly known as:  VITAMIN D Take 1,000 Units by mouth daily.   colchicine 0.6 MG tablet Take 0.6 mg by  mouth daily as needed (gout flares).   digoxin 0.125 MG tablet Commonly known as:  LANOXIN Take 0.5 tablets (0.0625 mg total) by mouth daily.   DULoxetine 30 MG capsule Commonly known as:  CYMBALTA Take 1 capsule (30 mg total) by mouth daily.   gabapentin 300 MG capsule Commonly known as:  NEURONTIN Take 2 capsules (600 mg total) by mouth 3 (three) times daily. TAKE ONE CAPSULE BY MOUTH 5 TIMES DAILY AS NEEDED FOR LEG PAIN     Investigational - Study Medication Take 1 tablet by mouth daily. Study name: [place] Additional study details: MK-1242 (vericiguat) or Placebo   ipratropium 0.06 % nasal spray Commonly known as:  ATROVENT Place 2 sprays into both nostrils daily.   lidocaine 5 % Commonly known as:  LIDODERM Place 1 patch onto the skin daily as needed (for pain.).   methocarbamol 750 MG tablet Commonly known as:  ROBAXIN Take 1 tablet (750 mg total) by mouth 2 (two) times daily as needed for muscle spasms.   metroNIDAZOLE 0.75 % gel Commonly known as:  METROGEL Apply 1 application topically daily as needed. FOR ROSACEA   nitroGLYCERIN 0.4 MG SL tablet Commonly known as:  NITROSTAT Place 1 tablet (0.4 mg total) under the tongue every 5 (five) minutes as needed for chest pain (MAX 3 TABLETS).   NOVOLIN N 100 UNIT/ML injection Generic drug:  insulin NPH Human Inject 6-8 Units into the skin See admin instructions. Inject 8 units subcutaneously in the morning and inject 6 units subcutaneously in the evening.   NOVOLIN R 100 units/mL injection Generic drug:  insulin regular Inject 6 Units into the skin daily before supper.   omeprazole 20 MG capsule Commonly known as:  PRILOSEC Take 2 capsules (40 mg total) by mouth 2 (two) times daily.   potassium chloride SA 20 MEQ tablet Commonly known as:  K-DUR,KLOR-CON Take 1 tablet (20 mEq total) by mouth daily.   sacubitril-valsartan 24-26 MG Commonly known as:  ENTRESTO Take 1 tablet by mouth 2 (two) times daily.   torsemide 20 MG tablet Commonly known as:  DEMADEX Take 4 tablets (80 mg total) by mouth 2 (two) times daily.   traMADol 50 MG tablet Commonly known as:  ULTRAM TAKE TWO TABLETS BY MOUTH EVERY 8 HOURS AS NEEDED FOR PAIN.   warfarin 2.5 MG tablet Commonly known as:  COUMADIN Take as directed by the anticoagulation clinic. If you are unsure how to take this medication, talk to your nurse or doctor. Original instructions:  TAKE 2  TABLETS (5 MG) BY MOUTH ON TUESDAYS AND SATURDAYS, THEN TAKE 1 TABLET (2.5 MG) BY MOUTH ON ALL OTHER DAYS.       Allergies:  Allergies  Allergen Reactions  . Avelox [Moxifloxacin Hcl In Nacl] Swelling, Rash and Other (See Comments)    Patient became hypotensive after infusion started Because of a history of documented adverse serious drug reaction;Medi Alert bracelet  is recommended  . Other Rash and Swelling    Patient became hypotensive after infusion started Because of a history of documented adverse serious drug reaction;Medi Alert braceletis recommended  . Penicillins Anaphylaxis, Swelling and Other (See Comments)    REACTION: anaphylaxis Because of a history of documented adverse serious drug reaction;Medi Alert bracelet  is recommended Has patient had a PCN reaction causing immediate rash, facial/tongue/throat swelling, SOB or lightheadedness with hypotension: Yes Has patient had a PCN reaction causing severe rash involving mucus membranes or skin necrosis: No Has patient had a PCN reaction  that required hospitalization: Unknown Has patient had a PCN reaction occurring within the last 10 years: No  REACTION: anaphylaxis Because of a history of documented adverse serious drug reaction;Medi Alert braceletis recommended Has patient had a PCN reaction causing immediate rash, facial/tongue/throat swelling, SOB or lightheadedness with hypotension: Yes Has patient had a PCN reaction causing severe rash involving mucus membranes or skin necrosis: No Has patient had a PCN reaction that required hospitalization: Unknown Has patient had a PCN reaction occurring within the last 10 years: No    Past Medical History, Surgical history, Social history, and Family History were reviewed and updated.  Review of Systems:  Review of Systems  Constitutional: Negative.   HENT: Negative.   Eyes: Negative.   Respiratory: Positive for shortness of breath.   Cardiovascular: Positive for  palpitations.  Gastrointestinal: Positive for constipation and heartburn.  Genitourinary: Positive for frequency.  Musculoskeletal: Positive for back pain and joint pain.  Skin: Negative.   Neurological: Positive for dizziness.  Endo/Heme/Allergies: Negative.   Psychiatric/Behavioral: Negative.      Physical Exam:  weight is 223 lb (101.2 kg). His oral temperature is 97.6 F (36.4 C). His blood pressure is 114/56 (abnormal) and his pulse is 51 (abnormal). His respiration is 19 and oxygen saturation is 97%.   Wt Readings from Last 3 Encounters:  11/09/17 223 lb (101.2 kg)  10/16/17 228 lb 3.2 oz (103.5 kg)  09/28/17 227 lb (103 kg)    Physical Exam  Constitutional: He is oriented to person, place, and time.  HENT:  Head: Normocephalic and atraumatic.  Mouth/Throat: Oropharynx is clear and moist.  Eyes: Pupils are equal, round, and reactive to light. EOM are normal.  Neck: Normal range of motion.  Cardiovascular: Normal rate, regular rhythm and normal heart sounds.  Pulmonary/Chest: Effort normal and breath sounds normal.  Abdominal: Soft. Bowel sounds are normal.  Musculoskeletal: Normal range of motion. He exhibits no edema, tenderness or deformity.  Lymphadenopathy:    He has no cervical adenopathy.  Neurological: He is alert and oriented to person, place, and time.  Skin: Skin is warm and dry. No rash noted. No erythema.  Psychiatric: He has a normal mood and affect. His behavior is normal. Judgment and thought content normal.  Vitals reviewed.    Lab Results  Component Value Date   WBC 6.5 11/09/2017   HGB 14.8 11/09/2017   HCT 45.1 11/09/2017   MCV 88.3 11/09/2017   PLT 168 11/09/2017   Lab Results  Component Value Date   FERRITIN 29 08/08/2017   IRON 59 08/08/2017   TIBC 399 08/08/2017   UIBC 340 08/08/2017   IRONPCTSAT 15 (L) 08/08/2017   Lab Results  Component Value Date   RETICCTPCT 1.4 04/02/2014   RBC 5.11 11/09/2017   RETICCTABS 75.2 04/02/2014     No results found for: KPAFRELGTCHN, LAMBDASER, KAPLAMBRATIO No results found for: IGGSERUM, IGA, IGMSERUM No results found for: Odetta Pink, SPEI   Chemistry      Component Value Date/Time   NA 144 11/09/2017 1309   NA 148 (H) 01/30/2017 1255   NA 141 03/09/2016 1320   K 4.2 11/09/2017 1309   K 4.3 01/30/2017 1255   K 4.5 03/09/2016 1320   CL 98 11/09/2017 1309   CL 101 01/30/2017 1255   CO2 31 11/09/2017 1309   CO2 28 01/30/2017 1255   CO2 25 03/09/2016 1320   BUN 35 (H) 11/09/2017 1309   BUN 43 (  H) 01/30/2017 1255   BUN 28.8 (H) 03/09/2016 1320   CREATININE 1.50 (H) 11/09/2017 1309   CREATININE 2.1 (H) 01/30/2017 1255   CREATININE 1.9 (H) 03/09/2016 1320      Component Value Date/Time   CALCIUM 8.8 11/09/2017 1309   CALCIUM 9.4 01/30/2017 1255   CALCIUM 9.2 03/09/2016 1320   ALKPHOS 75 11/09/2017 1309   ALKPHOS 68 01/30/2017 1255   ALKPHOS 97 03/09/2016 1320   AST 15 11/09/2017 1309   AST 13 03/09/2016 1320   ALT 12 11/09/2017 1309   ALT 19 01/30/2017 1255   ALT 10 03/09/2016 1320   BILITOT 1.0 11/09/2017 1309   BILITOT 0.88 03/09/2016 1320      Impression and Plan: Mr. Quebedeaux is a very pleasant 50 Iyo caucasian gentleman with polycythemia. He continues to do well and has no complaints at this time.   I think that we can hold on a phlebotomy today.  His hematocrit is just above 45%.  I just do not think that a phlebotomy would really help right now.  I would like to see him back in 2 months.  By then, I think that a phlebotomy likely would be necessary.  Volanda Napoleon, MD 8/22/20192:37 PM

## 2017-11-10 LAB — IRON AND TIBC
IRON: 81 ug/dL (ref 42–163)
Saturation Ratios: 22 % — ABNORMAL LOW (ref 42–163)
TIBC: 365 ug/dL (ref 202–409)
UIBC: 285 ug/dL

## 2017-11-10 LAB — FERRITIN: Ferritin: 41 ng/mL (ref 24–336)

## 2017-11-14 ENCOUNTER — Ambulatory Visit: Payer: Medicare Other | Admitting: *Deleted

## 2017-11-14 ENCOUNTER — Ambulatory Visit (HOSPITAL_COMMUNITY)
Admission: RE | Admit: 2017-11-14 | Discharge: 2017-11-14 | Disposition: A | Discharge status: Home / Self Care | Payer: Medicare Other | Source: Ambulatory Visit | Attending: Cardiology | Admitting: Cardiology

## 2017-11-14 ENCOUNTER — Encounter (HOSPITAL_COMMUNITY): Payer: Self-pay | Admitting: Cardiology

## 2017-11-14 VITALS — BP 133/82 | HR 70 | Wt 224.0 lb

## 2017-11-14 DIAGNOSIS — M109 Gout, unspecified: Secondary | ICD-10-CM | POA: Insufficient documentation

## 2017-11-14 DIAGNOSIS — I5022 Chronic systolic (congestive) heart failure: Secondary | ICD-10-CM | POA: Diagnosis not present

## 2017-11-14 DIAGNOSIS — I4891 Unspecified atrial fibrillation: Secondary | ICD-10-CM

## 2017-11-14 DIAGNOSIS — I714 Abdominal aortic aneurysm, without rupture, unspecified: Secondary | ICD-10-CM

## 2017-11-14 DIAGNOSIS — N183 Chronic kidney disease, stage 3 (moderate): Secondary | ICD-10-CM | POA: Diagnosis not present

## 2017-11-14 DIAGNOSIS — E785 Hyperlipidemia, unspecified: Secondary | ICD-10-CM | POA: Diagnosis not present

## 2017-11-14 DIAGNOSIS — I255 Ischemic cardiomyopathy: Secondary | ICD-10-CM | POA: Insufficient documentation

## 2017-11-14 DIAGNOSIS — I712 Thoracic aortic aneurysm, without rupture: Secondary | ICD-10-CM | POA: Insufficient documentation

## 2017-11-14 DIAGNOSIS — Z7901 Long term (current) use of anticoagulants: Secondary | ICD-10-CM | POA: Diagnosis not present

## 2017-11-14 DIAGNOSIS — Z87891 Personal history of nicotine dependence: Secondary | ICD-10-CM | POA: Insufficient documentation

## 2017-11-14 DIAGNOSIS — Z951 Presence of aortocoronary bypass graft: Secondary | ICD-10-CM | POA: Insufficient documentation

## 2017-11-14 DIAGNOSIS — I10 Essential (primary) hypertension: Secondary | ICD-10-CM

## 2017-11-14 DIAGNOSIS — Z79899 Other long term (current) drug therapy: Secondary | ICD-10-CM | POA: Diagnosis not present

## 2017-11-14 DIAGNOSIS — Z794 Long term (current) use of insulin: Secondary | ICD-10-CM | POA: Diagnosis not present

## 2017-11-14 DIAGNOSIS — I482 Chronic atrial fibrillation: Secondary | ICD-10-CM | POA: Insufficient documentation

## 2017-11-14 DIAGNOSIS — N62 Hypertrophy of breast: Secondary | ICD-10-CM | POA: Insufficient documentation

## 2017-11-14 DIAGNOSIS — I4821 Permanent atrial fibrillation: Secondary | ICD-10-CM

## 2017-11-14 DIAGNOSIS — D45 Polycythemia vera: Secondary | ICD-10-CM | POA: Diagnosis not present

## 2017-11-14 DIAGNOSIS — E1122 Type 2 diabetes mellitus with diabetic chronic kidney disease: Secondary | ICD-10-CM | POA: Insufficient documentation

## 2017-11-14 DIAGNOSIS — I2510 Atherosclerotic heart disease of native coronary artery without angina pectoris: Secondary | ICD-10-CM | POA: Diagnosis not present

## 2017-11-14 LAB — BASIC METABOLIC PANEL
ANION GAP: 9 (ref 5–15)
BUN: 25 mg/dL — ABNORMAL HIGH (ref 8–23)
CALCIUM: 8.9 mg/dL (ref 8.9–10.3)
CO2: 29 mmol/L (ref 22–32)
CREATININE: 1.48 mg/dL — AB (ref 0.61–1.24)
Chloride: 103 mmol/L (ref 98–111)
GFR, EST AFRICAN AMERICAN: 51 mL/min — AB (ref >60)
GFR, EST NON AFRICAN AMERICAN: 44 mL/min — AB (ref >60)
Glucose, Bld: 137 mg/dL — ABNORMAL HIGH (ref 70–99)
Potassium: 3.9 mmol/L (ref 3.5–5.1)
SODIUM: 141 mmol/L (ref 135–145)

## 2017-11-14 LAB — DIGOXIN LEVEL: Digoxin Level: 0.5 ng/mL — ABNORMAL LOW (ref 0.8–2.0)

## 2017-11-14 LAB — POCT INR: INR: 2.6 (ref 2.0–3.0)

## 2017-11-14 MED ORDER — AMLODIPINE BESYLATE 2.5 MG PO TABS: 2.5000 mg | ORAL_TABLET | Freq: Every day | ORAL | 6 refills | Status: DC

## 2017-11-14 NOTE — Progress Notes (Signed)
Error

## 2017-11-14 NOTE — Patient Instructions (Signed)
Description   Continue 1 tablet every day except 2 tablets on Tuesdays and Saturdays.  Recheck in 6 weeks.  Call with any new medications of changes  [phone] [facility].

## 2017-11-14 NOTE — Patient Instructions (Addendum)
Start Amlodipine 2.5 mg daily  Increase Torsemide to 100 mg Twice daily FOR 3 DAYS, then back to 80 mg Twice daily   Labs today  Your physician has requested that you regularly monitor and record your blood pressure readings at home. Please use the same machine at the same time of day to check your readings and record them to bring to your follow-up visit.  SEND [place] READING THROUGH MYCHART IN 2 WEEKS  Follow up with Dr Everrett Coombe office for your CT scan and follow up of your AAA.  Your physician recommends that you schedule a follow-up appointment in: 3 months

## 2017-11-15 NOTE — Progress Notes (Signed)
PCP: [name]. [name] [name] Cardiologist: [name] [name] [name]   HPI: [name] [name] is a 77 y.o. with history of CAD s/p CABG, permanent atrial fib, S/P AAA repair, ischemic cardiomyopathy/chronic systolic heart failure,  Medtronic CRT-D system.   Admitted 4/14 through 07/06/16 with volume overload. Diuresed with IV lasix and transitioned to torsemide 60 mg daily. HF meds adjusted and he was started on Entresto. Discharge weight was 220 pounds.  He now has a Cardiomems device. Torsemide has been increased to 80 mg bid based on Cardiomems evaluation.   Echo in 4/19 showed EF improved to 40-45%.  He stopped eplerenone due to breast pain which has resolved (same thing with spironolactone).   RHC was done in 7/19, PA pressure from [place] matched Cardiomems (there had been some question of Cardiomems accuracy).   He returns for followup of CHF and CAD.  He remains in atrial fibrillation (permanent).  Goal PADP is now 23-28, he is 27 today.  He was doing well until this week, but has developed increased dyspnea this week.  He has been short of breath showering, dressing, and walking up hills.  No chest pain.  No orthopnea/PND.  SBP in 150s generally when he checks at home.  Weight down 6 lbs. Walking is limited by low back pain, uses cane.   PADP 27 mmHg by CardioMems, rising over the last week.   Labs (5/18): K 4.7, creatinine 1.7, hgb 15.8, BNP 1001, LDL 71 Labs (8/18): K 4.3, creatinine 1.56, hgb 14.3 Labs (9/18): K 4, creatinine 1.79, digoxin 0.8 Labs (10/18): digoxin 0.3 Labs (11/18): K 4, creatinine 1.71 Labs (4/19): K 3.6, creatinine 1.45, LDL 52, HDL 42 Labs (5/19): K 4.1, creatinine 1.71 Labs (8/19): K 4.2, creatinine 1.5, hgb 14.8  PMH: 1. CAD: s/p CABG.  - LHC (12/17): totally occluded distal RCA, totally occluded mid LAD, totally occluded OM2 and OM3.  LIMA-LAD patent with 60% ostial stenosis.  SVG-OM3 and SVG-PDA were patent. Medical management.  2. Chronic systolic CHF: Ischemic cardiomyopathy.  He has a  Medtronic CRT-D device.  - Echo (12/17) with EF 35-40%, diffuse hypokinesis, normal RV size and systolic function, mild MR.  - CardioMems placed.  - Painful gynecomastia with spironolactone and eplerenone.  - Echo (4/19) with EF 40-45%, mild MR.  - RHC (7/19) with mean RA 9, PA 43/21 mean 30, mean PCWP 19, CI 2.15, PVR 2.3 WU (PA pressure matched Cardiomems).  3. AAA: s/p repair.  4. Atrial fibrillation: Permanent.  5. Gout 6. PAD: Saw [name] [name] with [facility] in 4/18.  Noninvasive study concerning for infra-inguinal arterial occlusive disease bilaterally but appeared to have adequate circulation to heal toe ulcer.  7. CKD stage 3.  8. Ascending aortic aneurysm: CT chest 3/18 ascending aorta 4.9 cm, arch 4.4 cm.  - CTA chest (5/19): 5.2 cm ascending aorta.  9. Spinal stenosis 10. Polycythemia vera 11. Hyperlipidemia 12. Type II diabetes    ROS: All systems negative except as listed in HPI, PMH and Problem List.  Social History   Socioeconomic History  . Marital status: Married    Spouse name: Not on file  . Number of children: Not on file  . Years of education: Not on file  . Highest education level: Not on file  Occupational History  . Occupation: Retired- Nurse, mental health  Social Needs  . Financial resource strain: Not on file  . Food insecurity:    Worry: Not on file    Inability: Not on file  . Transportation needs:  Medical: Not on file    Non-medical: Not on file  Tobacco Use  . Smoking status: Former Smoker    Packs/day: 3.00    Years: 39.00    Pack years: 117.00    Types: Cigarettes    Start date: 05/24/1956    Last attempt to quit: 03/22/1995    Years since quitting: 22.6  . Smokeless tobacco: Never Used  Substance and Sexual Activity  . Alcohol use: Yes    Alcohol/week: 2.0 standard drinks    Types: 2 Cans of beer per week  . Drug use: No  . Sexual activity: Never  Lifestyle  . Physical activity:    Days per week: Not on file    Minutes per session:  Not on file  . Stress: Not on file  Relationships  . Social connections:    Talks on phone: Not on file    Gets together: Not on file    Attends religious service: Not on file    Active member of club or organization: Not on file    Attends meetings of clubs or organizations: Not on file    Relationship status: Not on file  . Intimate partner violence:    Fear of current or ex partner: Not on file    Emotionally abused: Not on file    Physically abused: Not on file    Forced sexual activity: Not on file  Other Topics Concern  . Not on file  Social History Narrative  . Not on file    Family History  Problem Relation Age of Onset  . Hypertension Mother   . Stroke Mother   . Diabetes Father   . Coronary artery disease Father   . Other Father        DVT  . Diabetes Brother   . Colon cancer Neg Hx   . Heart attack Neg Hx      Current Outpatient Medications  Medication Sig Dispense Refill  . ACCU-CHEK AVIVA PLUS test strip USE 1 STRIP TO CHECK GLUCOSE THREE TIMES DAILY AS DIRECTED 300 each 3  . ACCU-CHEK FASTCLIX LANCETS MISC USE ONE LANCET TO CHECK GLUCOSE THREE TIMES DAILY 102 each 5  . acetaminophen (TYLENOL) 325 MG tablet Take 650-975 mg by mouth 4 (four) times daily as needed for moderate pain.     Marland Kitchen allopurinol (ZYLOPRIM) 100 MG tablet TAKE TWO TABLETS BY MOUTH ONCE DAILY 180 tablet 3  . atorvastatin (LIPITOR) 10 MG tablet Take 1 tablet (10 mg total) by mouth daily. 90 tablet 2  . carboxymethylcellulose (REFRESH PLUS) 0.5 % SOLN Place 2 drops into both eyes daily.     . carvedilol (COREG) 25 MG tablet Take 1 tablet (25 mg total) by mouth 2 (two) times daily with a meal. 60 tablet 3  . cholecalciferol (VITAMIN D) 1000 units tablet Take 1,000 Units by mouth daily.    . colchicine 0.6 MG tablet Take 0.6 mg by mouth daily as needed (gout flares).     Marland Kitchen digoxin (LANOXIN) 0.125 MG tablet Take 0.5 tablets (0.0625 mg total) by mouth daily. 15 tablet 6  . DULoxetine (CYMBALTA)  30 MG capsule Take 1 capsule (30 mg total) by mouth daily. 90 capsule 1  . gabapentin (NEURONTIN) 300 MG capsule Take 2 capsules (600 mg total) by mouth 3 (three) times daily. TAKE ONE CAPSULE BY MOUTH 5 TIMES DAILY AS NEEDED FOR LEG PAIN (Patient taking differently: Take 300-600 mg by mouth See admin instructions. TAKE 2 CAPSULES (600 MG)  IN THE MORNING, 2 CAPSULES (600 MG) IN THE EVENING, & 1 CAPSULE (300 MG) BY MOUTH AT BEDTIME.) 540 capsule 3  . insulin NPH Human (NOVOLIN N) 100 UNIT/ML injection Inject 6-8 Units into the skin See admin instructions. Inject 8 units subcutaneously in the morning and inject 6 units subcutaneously in the evening.    . insulin regular (NOVOLIN R) 100 units/mL injection Inject 6 Units into the skin daily before supper.     . Investigational - Study Medication Take 1 tablet by mouth daily. Study name: [place] Additional study details: MK-1242 (vericiguat) or Placebo 1 each PRN  . ipratropium (ATROVENT) 0.06 % nasal spray Place 2 sprays into both nostrils daily.     Marland Kitchen lidocaine (LIDODERM) 5 % Place 1 patch onto the skin daily as needed (for pain.).     Marland Kitchen methocarbamol (ROBAXIN) 750 MG tablet Take 1 tablet (750 mg total) by mouth 2 (two) times daily as needed for muscle spasms. 90 tablet 1  . metroNIDAZOLE (METROGEL) 0.75 % gel Apply 1 application topically daily as needed. FOR ROSACEA  2  . nitroGLYCERIN (NITROSTAT) 0.4 MG SL tablet Place 1 tablet (0.4 mg total) under the tongue every 5 (five) minutes as needed for chest pain (MAX 3 TABLETS). 25 tablet 3  . omeprazole (PRILOSEC) 20 MG capsule Take 2 capsules (40 mg total) by mouth 2 (two) times daily. 120 capsule 11  . potassium chloride SA (K-DUR,KLOR-CON) 20 MEQ tablet Take 1 tablet (20 mEq total) by mouth daily. 90 tablet 3  . sacubitril-valsartan (ENTRESTO) 24-26 MG Take 1 tablet by mouth 2 (two) times daily. 60 tablet 11  . torsemide (DEMADEX) 20 MG tablet Take 4 tablets (80 mg total) by mouth 2 (two) times daily.     . traMADol (ULTRAM) 50 MG tablet TAKE TWO TABLETS BY MOUTH EVERY 8 HOURS AS NEEDED FOR PAIN. 180 tablet 1  . warfarin (COUMADIN) 2.5 MG tablet TAKE 2 TABLETS (5 MG) BY MOUTH ON TUESDAYS AND SATURDAYS, THEN TAKE 1 TABLET (2.5 MG) BY MOUTH ON ALL OTHER DAYS. 120 tablet 1  . amLODipine (NORVASC) 2.5 MG tablet Take 1 tablet (2.5 mg total) by mouth daily. 30 tablet 6   No current facility-administered medications for this encounter.     Vitals:   11/14/17 0952  BP: 133/82  Pulse: 70  SpO2: 94%  Weight: 101.6 kg (224 lb)    PHYSICAL EXAM: General: NAD Neck: No JVD, no thyromegaly or thyroid nodule.  Lungs: Clear to auscultation bilaterally with normal respiratory effort. CV: Nondisplaced PMI.  Heart regular S1/S2, no S3/S4, no murmur.  1+ ankle edema.  No carotid bruit.  Normal pedal pulses.  Abdomen: Soft, nontender, no hepatosplenomegaly, no distention.  Skin: Intact without lesions or rashes.  Neurologic: Alert and oriented x 3.  Psych: Normal affect. Extremities: No clubbing or cyanosis.  HEENT: Normal.    ASSESSMENT & PLAN: 1. Chronic Systolic Heart Failure: Ischemic cardiomyopathy. Echo 4/19 with EF 40-45%, some improvement. Medtronic CRT-D.  NYHA class II-III.   PADP rising by Cardiomems, correlating with increased exertional dyspnea over the last few days.  On exam, he does not appear markedly volume overloaded but does have some edema.  - Increase torsemide to 100 mg bid x 3 days then back to 80 mg bid.  Continue to follow very low Na diet.  - Continue digoxin, check level today.  - Continue current Entresto, unable to increase due to significant lightheadedness on higher dose.  - Unable to take  eplerenone or spironolactone due to breast tenderness.  This has mostly resolved off these meds.   - Continue Coreg 25 mg bid.  2. Atrial fibrillation: Permanent.  Rate-controlled.  Continue warfarin.   3. CAD: s/p CABG.  No chest pain.  - No ASA given warfarin use.  - Continue  statin.  Good lipids 4/19.   4. CKD Stage III: Recent creatinine 1.5, check today.  5. Gout: stable. Continue allopurinol.  6. Ascending aortic aneurysm: 5/19 CT chest with 5.2 cm ascending aorta.  He saw [name]. Servando [name].  Aneurysm has been relatively stable, would be high risk for operation.  For now, will monitor with no plans for surgery.   - Keep SBP < 130 if possible.  BP has been running high at home => cannot tolerate increased Entresto and has not been able to take spironolactone or eplerenone.  I will have him add amlodipine 2.5 mg daily.  He will send up BP readings in 2 wks to see if amlodipine needs to be increased.  - Will get repeat CT chest w/o contrast in 11/19 => through [name]. Everrett [name] office.    Followup in 3 months.   [name] [name]  11/15/2017

## 2017-11-21 ENCOUNTER — Telehealth (HOSPITAL_COMMUNITY): Payer: Self-pay | Admitting: Pharmacist

## 2017-11-21 NOTE — Telephone Encounter (Signed)
[employer] patient assistance denied because patient has only spent 3% of his annual income on medications this year and to qualify he needs to have spent 6%. Will attempt to send a letter of hardship to appeal the decision. Mrs. Sankey will call me back when she gets the information needed for the letter.   Ruta Hinds. Velva Harman, PharmD, BCPS, CPP Clinical Pharmacist Phone: [phone] 11/21/2017 4:12 PM

## 2017-11-23 NOTE — Progress Notes (Signed)
Subjective:    Patient ID: [name], male    DOB: 1940/11/10, 77 y.o.   MRN: [id number]  HPI The patient is here for an acute visit.  He is here for an acute visit for cold symptoms.  His symptoms started 4 days  He is experiencing a productive yellow cough, mild shortness of breath, wheezing, sore throat and fatigue.  He denies any obvious fevers, chills, other cold symptoms, chest pain, headaches and lightheadedness.  He has had bronchitis several times in the past and pneumonia but he is concerned this will turn into that.  He has COPD.     Medications and allergies reviewed with patient and updated if appropriate.  Patient Active Problem List   Diagnosis Date Noted  . Abnormal CXR 05/29/2016  . Effusion of left elbow 04/13/2016  . Angina pectoris ([place]) 03/15/2016  . History of gout 10/09/2015  . Polycythemia vera ([place]) 07/28/2014  . Ascending aortic aneurysm ([place]) 05/10/2014  . Hypoxia 05/05/2014  . COPD exacerbation ([place]) 05/05/2014  . Diabetes ([place]) 05/05/2014  . Chronic systolic CHF (congestive heart failure) ([place]) 03/07/2014  . History of DVT (deep vein thrombosis) 03/07/2014  . Cardiomyopathy, ischemic 02/05/2014  . Pleural plaque without asbestosis 01/23/2014  . Elevated hemoglobin ([place]) 01/14/2014  . CKD (chronic kidney disease) stage 3, GFR 30-59 ml/min (HCC) 01/14/2014  . Asbestos exposure 01/14/2014  . Shortness of breath 01/01/2014  . Ejection fraction   . Hypopotassemia 12/20/2012  . ACE-inhibitor cough 10/04/2012  . Ventral hernia   . Bony abnormality   . Carotid artery disease ([place])   . Spinal cord stimulator status   . Peripheral vascular disease with claudication ([place]) 08/31/2011  . Atrial fibrillation with rapid ventricular response ([place])   . Spinal stenosis   . CAD (coronary artery disease)   . Venous insufficiency   . Mitral regurgitation   . Aortic valve sclerosis   . Chronotropic incompetence   . Thrombophlebitis of superficial  veins of upper extremities   . Pericardial effusion   . Overweight(278.02)   . Pleural effusion   . Hyperlipidemia   . Hypertension   . Atrial flutter ([place])   . Left atrial thrombus   . AAA (abdominal aortic aneurysm) ([place])   . S/P ICD (internal cardiac defibrillator) procedure   . Warfarin anticoagulation   . Diabetic polyneuropathy ([place]) 01/07/2010  . DIVERTICULOSIS, COLON 06/10/2009  . BACK PAIN, LUMBAR 06/12/2008  . 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 Acute gout 07/27/2006  . CORONARY ARTERY BYPASS GRAFT, HX OF 07/27/2006    Current Outpatient Medications on File Prior to Visit  Medication Sig Dispense Refill  . ACCU-CHEK AVIVA PLUS test strip USE 1 STRIP TO CHECK GLUCOSE THREE TIMES DAILY AS DIRECTED 300 each 3  . ACCU-CHEK FASTCLIX LANCETS MISC USE ONE LANCET TO CHECK GLUCOSE THREE TIMES DAILY 102 each 5  . acetaminophen (TYLENOL) 325 MG tablet Take 650-975 mg by mouth 4 (four) times daily as needed for moderate pain.     Marland Kitchen allopurinol (ZYLOPRIM) 100 MG tablet TAKE TWO TABLETS BY MOUTH ONCE DAILY 180 tablet 3  . amLODipine (NORVASC) 2.5 MG tablet Take 1 tablet (2.5 mg total) by mouth daily. 30 tablet 6  . atorvastatin (LIPITOR) 10 MG tablet Take 1 tablet (10 mg total) by mouth daily. 90 tablet 2  . carboxymethylcellulose (REFRESH PLUS) 0.5 % SOLN Place 2 drops into both eyes daily.     . carvedilol (COREG) 25 MG tablet  Take 1 tablet (25 mg total) by mouth 2 (two) times daily with a meal. 60 tablet 3  . cholecalciferol (VITAMIN D) 1000 units tablet Take 1,000 Units by mouth daily.    . colchicine 0.6 MG tablet Take 0.6 mg by mouth daily as needed (gout flares).     Marland Kitchen digoxin (LANOXIN) 0.125 MG tablet Take 0.5 tablets (0.0625 mg total) by mouth daily. 15 tablet 6  . DULoxetine (CYMBALTA) 30 MG capsule Take 1 capsule (30 mg total) by mouth daily. 90 capsule 1  . gabapentin (NEURONTIN) 300 MG capsule Take 2 capsules (600 mg total) by mouth 3 (three) times  daily. TAKE ONE CAPSULE BY MOUTH 5 TIMES DAILY AS NEEDED FOR LEG PAIN (Patient taking differently: Take 300-600 mg by mouth See admin instructions. TAKE 2 CAPSULES (600 MG) IN THE MORNING, 2 CAPSULES (600 MG) IN THE EVENING, & 1 CAPSULE (300 MG) BY MOUTH AT BEDTIME.) 540 capsule 3  . insulin NPH Human (NOVOLIN N) 100 UNIT/ML injection Inject 6-8 Units into the skin See admin instructions. Inject 8 units subcutaneously in the morning and inject 6 units subcutaneously in the evening.    . insulin regular (NOVOLIN R) 100 units/mL injection Inject 6 Units into the skin daily before supper.     Marland Kitchen ipratropium (ATROVENT) 0.06 % nasal spray Place 2 sprays into both nostrils daily.     Marland Kitchen lidocaine (LIDODERM) 5 % Place 1 patch onto the skin daily as needed (for pain.).     Marland Kitchen methocarbamol (ROBAXIN) 750 MG tablet Take 1 tablet (750 mg total) by mouth 2 (two) times daily as needed for muscle spasms. 90 tablet 1  . metroNIDAZOLE (METROGEL) 0.75 % gel Apply 1 application topically daily as needed. FOR ROSACEA  2  . nitroGLYCERIN (NITROSTAT) 0.4 MG SL tablet Place 1 tablet (0.4 mg total) under the tongue every 5 (five) minutes as needed for chest pain (MAX 3 TABLETS). 25 tablet 3  . omeprazole (PRILOSEC) 20 MG capsule Take 2 capsules (40 mg total) by mouth 2 (two) times daily. 120 capsule 11  . potassium chloride SA (K-DUR,KLOR-CON) 20 MEQ tablet Take 1 tablet (20 mEq total) by mouth daily. 90 tablet 3  . sacubitril-valsartan (ENTRESTO) 24-26 MG Take 1 tablet by mouth 2 (two) times daily. 60 tablet 11  . torsemide (DEMADEX) 20 MG tablet Take 4 tablets (80 mg total) by mouth 2 (two) times daily.    . traMADol (ULTRAM) 50 MG tablet TAKE TWO TABLETS BY MOUTH EVERY 8 HOURS AS NEEDED FOR PAIN. 180 tablet 1  . warfarin (COUMADIN) 2.5 MG tablet TAKE 2 TABLETS (5 MG) BY MOUTH ON TUESDAYS AND SATURDAYS, THEN TAKE 1 TABLET (2.5 MG) BY MOUTH ON ALL OTHER DAYS. 120 tablet 1   No current facility-administered medications on file  prior to visit.     Past Medical History:  Diagnosis Date  . AAA (abdominal aortic aneurysm) ([place])    Surgical repair 11/2002.  . Adenomatous colon polyp   . Alcohol ingestion of more than four drinks per week    Excess beer  not a dependency problem  . Aortic valve sclerosis   . Arthritis   . Atrial fibrillation ([place])    Previous long-term amiodarone therapy with multiple cardioversions / amiodarone stopped September, 2009  . Atrial flutter [facility])    Started November, 2010, Left-sided and cannot ablate  . Bony abnormality    Patient's manubrium is slightly displaced to the right  . CAD (coronary artery disease)  a. s/p CABG 2004;  b. 02/2016 Cath: LM nl, LAD 70p, 136m, D1 50, D2 50, RI 50ost, LCX 95m, OM2 100, OM3 100, RCA 70p, 100d, VG->OM3 ok, LIMA->LAD 60ost, VG->RPDA  ok.  . 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 01/2013 MDT DTBA 1D1 [place] CRT-D, ser # [id number].  . Carotid artery disease ([place])    Doppler, December, 2013, 0-39% bilateral  . Chronic systolic CHF (congestive heart failure) ([place])    a. 02/2016 Echo: EF 35-40%, diff HK, triv AI, mildly dil Ao root 85mm, mild MR, sev dil LA, triv TR.  Marland Kitchen Chronotropic incompetence    IV pacing rate adjusted  . CKD (chronic kidney disease), stage III ([place])   . COPD (chronic obstructive pulmonary disease) ([place])   . Dilated aortic root ([place])   . Discolored skin   . Diverticulosis   . Drug therapy    Redness and swelling with Avelox infusion May 24, 2011  . Eye abnormality    Ophthalmologist questions a clot in one of his eyes, May, 2012  . Gout   . Hyperlipidemia   . Hypertension   . Internal hemorrhoids   . Ischemic cardiomyopathy   . Left atrial thrombus    Remote past... cardioversions done since that time  . Mitral regurgitation    Mild echo  . Myocardial infarction ([place])   . Nasal drainage    Chronic  . Overweight(278.02)   . Pericardial effusion   . Pleural effusion    Large  loculated effusion on the left side November, 2011. This was tapped. It was exudative. Cytology revealed no cancer no proof of mesothelioma area pulmonary team felt that no further workup was needed  . Pleural thickening   . Pneumonia   . Polycythemia vera ([place]) 07/28/2014  . SOB (shortness of breath)    Large left effusion/ thoracentesis/hospitalization/November, 2011... exudated.. cytology negative.. Dr.Wert.. no proof of mesothelioma  . Spinal stenosis    Surgery Dr.Elsner  . Thrombophlebitis of superficial veins of upper extremities    Possible venous stenosis from defibrillator  . Type II diabetes mellitus ([place]) 2008  . Venous insufficiency    Toe discoloration chronic  . Ventral hernia    April, 2014, result of his abdominal surgery  . Warfarin anticoagulation   . Wide-complex tachycardia ([place])     Past Surgical History:  Procedure Laterality Date  . ABDOMINAL AORTIC ANEURYSM REPAIR  11/2002  . ANTERIOR CERVICAL DECOMP/DISCECTOMY FUSION  1995  . BACK SURGERY    . BI-VENTRICULAR PACEMAKER INSERTION (CRT-P)  02-11-2013   Pt with previously implanted MDT CRTD downgraded to CRTP by Dr Lovena Le 02-11-13  . CARDIAC CATHETERIZATION  "several"  . CARDIAC CATHETERIZATION N/A 03/18/2016   Procedure: RIGHT/LEFT HEART CATH AND CORONARY/GRAFT ANGIOGRAPHY;  Surgeon: Nelva Bush, MD;  Location: [place] [facility];  Service: Cardiovascular;  Laterality: N/A;  . CATARACT EXTRACTION W/ INTRAOCULAR LENS  IMPLANT, BILATERAL    . COLONOSCOPY W/ POLYPECTOMY    . CORONARY ANGIOPLASTY WITH STENT PLACEMENT  2004  . CORONARY ARTERY BYPASS GRAFT  2004   CABG X4  . FRACTURE SURGERY    . IMPLANTABLE CARDIOVERTER DEFIBRILLATOR (ICD) GENERATOR CHANGE N/A 02/11/2013   Procedure: ICD GENERATOR CHANGE;  Surgeon: Evans Lance, MD;  Location[facility] CATH LAB;  Service: Cardiovascular;  Laterality: N/A;  . INCISION AND DRAINAGE ABSCESS / HEMATOMA OF BURSA / KNEE / THIGH    . INSERT / REPLACE / REMOVE PACEMAKER   2009  original pacer/defibrillator; Dr. Lovena Le 2009... by the pacing  . LUMBAR FUSION  2010  . ORIF TIBIA & FIBULA FRACTURES Right 2000s  . PILONIDAL CYST EXCISION    . RIGHT HEART CATH N/A 10/19/2016   Procedure: Right Heart Cath;  Surgeon: [name], MD;  Location: [place] [facility];  Service: Cardiovascular;  Laterality: N/A;  . RIGHT HEART CATH N/A 09/28/2017   Procedure: RIGHT HEART CATH;  Surgeon: [name], MD;  Location: [place] [facility];  Service: Cardiovascular;  Laterality: N/A;  . SPINAL CORD STIMULATOR IMPLANT  12/2011  . SURGERY SCROTAL / TESTICULAR      Social History   Socioeconomic History  . Marital status: Married    Spouse name: Not on file  . Number of children: Not on file  . Years of education: Not on file  . Highest education level: Not on file  Occupational History  . Occupation: Retired- Nurse, mental health  Social Needs  . Financial resource strain: Not on file  . Food insecurity:    Worry: Not on file    Inability: Not on file  . Transportation needs:    Medical: Not on file    Non-medical: Not on file  Tobacco Use  . Smoking status: Former Smoker    Packs/day: 3.00    Years: 39.00    Pack years: 117.00    Types: Cigarettes    Start date: 05/24/1956    Last attempt to quit: 03/22/1995    Years since quitting: 22.6  . Smokeless tobacco: Never Used  Substance and Sexual Activity  . Alcohol use: Yes    Alcohol/week: 2.0 standard drinks    Types: 2 Cans of beer per week  . Drug use: No  . Sexual activity: Never  Lifestyle  . Physical activity:    Days per week: Not on file    Minutes per session: Not on file  . Stress: Not on file  Relationships  . Social connections:    Talks on phone: Not on file    Gets together: Not on file    Attends religious service: Not on file    Active member of club or organization: Not on file    Attends meetings of clubs or organizations: Not on file    Relationship status: Not on file    Other Topics Concern  . Not on file  Social History Narrative  . Not on file    Family History  Problem Relation Age of Onset  . Hypertension Mother   . Stroke Mother   . Diabetes Father   . Coronary artery disease Father   . Other Father        DVT  . Diabetes Brother   . Colon cancer Neg Hx   . Heart attack Neg Hx     Review of Systems  Constitutional: Positive for fatigue. Negative for chills and fever.  HENT: Positive for sore throat. Negative for congestion, ear pain and sinus pain.   Respiratory: Positive for cough (productive - yellow sputum), shortness of breath and wheezing.   Cardiovascular: Negative for chest pain.  Neurological: Negative for light-headedness and headaches.       Objective:   Vitals:   11/24/17 1001  BP: 118/72  Pulse: 67  Resp: 18  Temp: 97.9 F (36.6 C)  SpO2: 92%   BP Readings from Last 3 Encounters:  11/24/17 118/72  11/14/17 133/82  11/09/17 (!) 114/56   Wt Readings from Last 3 Encounters:  11/24/17 223 lb (101.2 kg)  11/14/17 224 lb (101.6 kg)  11/09/17 223 lb (101.2 kg)   Body mass index is 30.24 kg/m.   Physical Exam    GENERAL APPEARANCE: Appears stated age, well appearing, NAD EYES: conjunctiva clear, no icterus HEENT: bilateral tympanic membranes and ear canals normal, oropharynx with mild erythema, no thyromegaly, trachea midline, no cervical or supraclavicular lymphadenopathy LUNGS: Clear to auscultation without wheeze or crackles, unlabored breathing, good air entry bilaterally CARDIOVASCULAR: Normal S1,S2 without murmurs, no edema SKIN: Warm, dry      Assessment & Plan:    See Problem List for Assessment and Plan of chronic medical problems.

## 2017-11-24 ENCOUNTER — Telehealth: Payer: Self-pay

## 2017-11-24 ENCOUNTER — Encounter: Payer: Self-pay | Admitting: Internal Medicine

## 2017-11-24 ENCOUNTER — Encounter (HOSPITAL_COMMUNITY): Payer: Self-pay | Admitting: Pharmacist

## 2017-11-24 ENCOUNTER — Ambulatory Visit: Payer: Medicare Other | Admitting: Internal Medicine

## 2017-11-24 VITALS — BP 118/72 | HR 67 | Temp 97.9°F | Resp 18 | Ht 72.0 in | Wt 223.0 lb

## 2017-11-24 DIAGNOSIS — J209 Acute bronchitis, unspecified: Secondary | ICD-10-CM

## 2017-11-24 MED ORDER — DOXYCYCLINE HYCLATE 100 MG PO TABS: 100.0000 mg | ORAL_TABLET | Freq: Two times a day (BID) | ORAL | 0 refills | Status: DC

## 2017-11-24 NOTE — Patient Instructions (Addendum)
Take the antibiotic as prescribed.  Take over the counter cold medication as needed.   Call if no improvement

## 2017-11-24 NOTE — Assessment & Plan Note (Signed)
Symptoms consistent with early bacterial bronchitis, with possible mild COPD exacerbation We will start an antibiotic given his history and other chronic medical problems-doxycycline twice daily x10 days Over-the-counter cold medications as needed Rest, fluids Call if no improvement or worsening  He will have his INR checked early next week since he was started on doxycycline.

## 2017-11-24 NOTE — Telephone Encounter (Signed)
Received fax from pharmacy that there is a drug interaction with warfarin and doxycycline. Do you still want this filled?

## 2017-11-24 NOTE — Telephone Encounter (Signed)
Spoke with [facility] to inform.

## 2017-11-24 NOTE — Telephone Encounter (Signed)
Yes, will have INR checked early next week

## 2017-11-27 ENCOUNTER — Other Ambulatory Visit: Payer: Self-pay | Admitting: Endocrinology

## 2017-11-27 ENCOUNTER — Ambulatory Visit: Payer: Medicare Other | Admitting: Internal Medicine

## 2017-12-07 ENCOUNTER — Encounter (HOSPITAL_COMMUNITY): Payer: Self-pay

## 2017-12-07 ENCOUNTER — Ambulatory Visit: Payer: Medicare Other | Admitting: *Deleted

## 2017-12-07 DIAGNOSIS — Z5181 Encounter for therapeutic drug level monitoring: Secondary | ICD-10-CM

## 2017-12-07 DIAGNOSIS — I482 Chronic atrial fibrillation: Secondary | ICD-10-CM | POA: Diagnosis not present

## 2017-12-07 DIAGNOSIS — I4821 Permanent atrial fibrillation: Secondary | ICD-10-CM

## 2017-12-07 DIAGNOSIS — I4891 Unspecified atrial fibrillation: Secondary | ICD-10-CM

## 2017-12-07 LAB — CUP PACEART REMOTE DEVICE CHECK
Brady Statistic AP VP Percent: 0 %
Brady Statistic AP VS Percent: 0 %
Brady Statistic RV Percent Paced: 36.17 %
HIGH POWER IMPEDANCE MEASURED VALUE: 66 Ohm
Implantable Lead Implant Date: 20090121
Implantable Lead Implant Date: 20090121
Implantable Lead Location: 753858
Implantable Lead Location: 753860
Implantable Lead Model: 5076
Lead Channel Impedance Value: 399 Ohm
Lead Channel Impedance Value: 4047 Ohm
Lead Channel Impedance Value: 4047 Ohm
Lead Channel Pacing Threshold Amplitude: 0.625 V
Lead Channel Pacing Threshold Pulse Width: 0.4 ms
Lead Channel Sensing Intrinsic Amplitude: 3.75 mV
Lead Channel Sensing Intrinsic Amplitude: 3.75 mV
Lead Channel Setting Pacing Pulse Width: 0.4 ms
Lead Channel Setting Sensing Sensitivity: 0.3 mV
MDC IDC LEAD IMPLANT DT: 20090121
MDC IDC LEAD LOCATION: 753859
MDC IDC MSMT BATTERY REMAINING LONGEVITY: 41 mo
MDC IDC MSMT BATTERY VOLTAGE: 2.96 V
MDC IDC MSMT LEADCHNL LV IMPEDANCE VALUE: 437 Ohm
MDC IDC MSMT LEADCHNL RA IMPEDANCE VALUE: 494 Ohm
MDC IDC MSMT LEADCHNL RV IMPEDANCE VALUE: 323 Ohm
MDC IDC MSMT LEADCHNL RV SENSING INTR AMPL: 12.625 mV
MDC IDC MSMT LEADCHNL RV SENSING INTR AMPL: 12.625 mV
MDC IDC PG IMPLANT DT: 20141124
MDC IDC SESS DTM: 20190814200855
MDC IDC SET LEADCHNL RV PACING AMPLITUDE: 2.5 V
MDC IDC STAT BRADY AS VP PERCENT: 75 %
MDC IDC STAT BRADY AS VS PERCENT: 25 %
MDC IDC STAT BRADY RA PERCENT PACED: 0 %

## 2017-12-07 LAB — POCT INR: INR: 4.5 — AB (ref 2.0–3.0)

## 2017-12-07 NOTE — Patient Instructions (Signed)
Description   Hold for today and tomorrow and Continue 1 tablet every day except 2 tablets on Tuesdays and Saturdays.  Recheck Oct 8th. Call with any new medications of changes  [phone] [facility].

## 2017-12-08 NOTE — Telephone Encounter (Signed)
Dr. Aundra Dubin - Per my office visit on 8/27 during which you started me on an additional RX which is AMLODIPINE to see if it would lower my blood pressure. Dosage is 2.5 mg daily which I take firsrt thing in the morning. You also asked me to send you my blood pressure readings for 2 weeks.     8/28 128-67-57 am      657-84-69 pm  8/29 629-52-84 am      132-44-01 pm  8/30 027-25-36 am      644-03-47 pm  8/31 140-80-56 am       425-95-63 pm  9/1  158-90-60 am       875-64-33 pm  9/3  134-85-65 am       295-18-84 pm  9/4  166-06-30 am       160-10-93 pm  9/7  235-57-32 am         202-54-27 pm  9/9  143-74-61 am       062-37-62 pm  9/10 137-82-65 am       132-68-56 pm  9/11 831-51-76 am       160-73-71 pm  9/12  131-79-61 am       121-57-53 pm  9/13  062-69-48 am       546-27-03 pm    Please let me know what you think.    Dairl Ponder [name]) B/D 05/02/2041

## 2017-12-10 ENCOUNTER — Other Ambulatory Visit: Payer: Self-pay | Admitting: Endocrinology

## 2017-12-12 ENCOUNTER — Other Ambulatory Visit (HOSPITAL_COMMUNITY): Payer: Self-pay | Admitting: *Deleted

## 2017-12-12 MED ORDER — AMLODIPINE BESYLATE 5 MG PO TABS: 5.0000 mg | ORAL_TABLET | Freq: Every day | ORAL | 6 refills | Status: DC

## 2017-12-13 ENCOUNTER — Telehealth (HOSPITAL_COMMUNITY): Payer: Self-pay | Admitting: Pharmacist

## 2017-12-13 NOTE — Telephone Encounter (Signed)
[employer] patient assistance approved for [employer] 24-26 mg BID through 03/20/18.   Ruta Hinds. Velva Harman, PharmD, BCPS, CPP Clinical Pharmacist Phone: [phone] 12/13/2017 3:08 PM

## 2017-12-25 ENCOUNTER — Other Ambulatory Visit (HOSPITAL_COMMUNITY): Payer: Self-pay | Admitting: Cardiology

## 2017-12-25 DIAGNOSIS — I482 Chronic atrial fibrillation, unspecified: Secondary | ICD-10-CM

## 2017-12-25 DIAGNOSIS — Z7901 Long term (current) use of anticoagulants: Secondary | ICD-10-CM

## 2017-12-26 ENCOUNTER — Ambulatory Visit (INDEPENDENT_AMBULATORY_CARE_PROVIDER_SITE_OTHER): Payer: Medicare Other | Admitting: *Deleted

## 2017-12-26 ENCOUNTER — Ambulatory Visit: Payer: Medicare Other | Admitting: *Deleted

## 2017-12-26 ENCOUNTER — Telehealth (HOSPITAL_COMMUNITY): Payer: Self-pay

## 2017-12-26 DIAGNOSIS — I4821 Permanent atrial fibrillation: Secondary | ICD-10-CM

## 2017-12-26 DIAGNOSIS — Z5181 Encounter for therapeutic drug level monitoring: Secondary | ICD-10-CM | POA: Diagnosis not present

## 2017-12-26 DIAGNOSIS — I4891 Unspecified atrial fibrillation: Secondary | ICD-10-CM

## 2017-12-26 DIAGNOSIS — Z23 Encounter for immunization: Secondary | ICD-10-CM | POA: Diagnosis not present

## 2017-12-26 LAB — POCT INR: INR: 2.8 (ref 2.0–3.0)

## 2017-12-26 NOTE — Telephone Encounter (Signed)
  His cardiomems number has slightly trended up since 12/20/17, but is within his perceived threshold at 26 (upper limit for him is 28).   It is OK for him to take torsemide 100 mg BID x 2 days to get his fluid back under control.     Legrand Como [address]" [place], PA-C 12/26/2017 10:11 AM

## 2017-12-26 NOTE — Telephone Encounter (Signed)
Pt's wife notified and verbalizes understanding.

## 2017-12-26 NOTE — Progress Notes (Signed)
FLU

## 2017-12-26 NOTE — Patient Instructions (Signed)
Description   Continue 1 tablet every day except 2 tablets on Tuesdays and Saturdays.  Recheck in 6 weeks. Call with any new medications of changes  [phone] [facility].

## 2017-12-26 NOTE — Telephone Encounter (Signed)
Pt's wife called and stated that pt is having SOB, 1 lb weight gain everyday. Wife would like for someone to read his cardiomems device. Pt is currently taking torsemide 20 mg ( 4 tabs (80 mg) by  mouth twice daily). Please advise.  Labs: 11/14/2017  Glucose: 137 BUN: 25 Creatnine: 1.48

## 2017-12-29 ENCOUNTER — Telehealth (HOSPITAL_COMMUNITY): Payer: Self-pay

## 2017-12-29 ENCOUNTER — Ambulatory Visit (HOSPITAL_COMMUNITY)
Admission: RE | Admit: 2017-12-29 | Discharge: 2017-12-29 | Disposition: A | Discharge status: Home / Self Care | Payer: Medicare Other | Source: Ambulatory Visit | Attending: Cardiology | Admitting: Cardiology

## 2017-12-29 ENCOUNTER — Other Ambulatory Visit (HOSPITAL_COMMUNITY): Payer: Self-pay

## 2017-12-29 VITALS — BP 112/77 | HR 40 | Wt 228.4 lb

## 2017-12-29 DIAGNOSIS — Z7901 Long term (current) use of anticoagulants: Secondary | ICD-10-CM | POA: Diagnosis not present

## 2017-12-29 DIAGNOSIS — I712 Thoracic aortic aneurysm, without rupture: Secondary | ICD-10-CM | POA: Diagnosis not present

## 2017-12-29 DIAGNOSIS — E785 Hyperlipidemia, unspecified: Secondary | ICD-10-CM | POA: Insufficient documentation

## 2017-12-29 DIAGNOSIS — I2510 Atherosclerotic heart disease of native coronary artery without angina pectoris: Secondary | ICD-10-CM | POA: Insufficient documentation

## 2017-12-29 DIAGNOSIS — E1151 Type 2 diabetes mellitus with diabetic peripheral angiopathy without gangrene: Secondary | ICD-10-CM | POA: Diagnosis not present

## 2017-12-29 DIAGNOSIS — Z8249 Family history of ischemic heart disease and other diseases of the circulatory system: Secondary | ICD-10-CM | POA: Diagnosis not present

## 2017-12-29 DIAGNOSIS — N183 Chronic kidney disease, stage 3 (moderate): Secondary | ICD-10-CM | POA: Diagnosis not present

## 2017-12-29 DIAGNOSIS — I447 Left bundle-branch block, unspecified: Secondary | ICD-10-CM | POA: Diagnosis not present

## 2017-12-29 DIAGNOSIS — R0789 Other chest pain: Secondary | ICD-10-CM | POA: Diagnosis not present

## 2017-12-29 DIAGNOSIS — Z794 Long term (current) use of insulin: Secondary | ICD-10-CM | POA: Insufficient documentation

## 2017-12-29 DIAGNOSIS — Z951 Presence of aortocoronary bypass graft: Secondary | ICD-10-CM | POA: Diagnosis not present

## 2017-12-29 DIAGNOSIS — E1122 Type 2 diabetes mellitus with diabetic chronic kidney disease: Secondary | ICD-10-CM | POA: Insufficient documentation

## 2017-12-29 DIAGNOSIS — Z87891 Personal history of nicotine dependence: Secondary | ICD-10-CM | POA: Diagnosis not present

## 2017-12-29 DIAGNOSIS — I10 Essential (primary) hypertension: Secondary | ICD-10-CM

## 2017-12-29 DIAGNOSIS — I4821 Permanent atrial fibrillation: Secondary | ICD-10-CM | POA: Diagnosis not present

## 2017-12-29 DIAGNOSIS — M109 Gout, unspecified: Secondary | ICD-10-CM | POA: Diagnosis not present

## 2017-12-29 DIAGNOSIS — I255 Ischemic cardiomyopathy: Secondary | ICD-10-CM | POA: Diagnosis not present

## 2017-12-29 DIAGNOSIS — I5022 Chronic systolic (congestive) heart failure: Secondary | ICD-10-CM | POA: Insufficient documentation

## 2017-12-29 DIAGNOSIS — Z79899 Other long term (current) drug therapy: Secondary | ICD-10-CM | POA: Diagnosis not present

## 2017-12-29 LAB — CBC
HEMATOCRIT: 46.4 % (ref 39.0–52.0)
HEMOGLOBIN: 14.1 g/dL (ref 13.0–17.0)
MCH: 28.3 pg (ref 26.0–34.0)
MCHC: 30.4 g/dL (ref 30.0–36.0)
MCV: 93 fL (ref 80.0–100.0)
NRBC: 0 % (ref 0.0–0.2)
Platelets: 191 10*3/uL (ref 150–400)
RBC: 4.99 MIL/uL (ref 4.22–5.81)
RDW: 17.7 % — ABNORMAL HIGH (ref 11.5–15.5)
WBC: 6.3 10*3/uL (ref 4.0–10.5)

## 2017-12-29 LAB — BRAIN NATRIURETIC PEPTIDE: B NATRIURETIC PEPTIDE 5: 1274.1 pg/mL — AB (ref 0.0–100.0)

## 2017-12-29 LAB — BASIC METABOLIC PANEL
Anion gap: 9 (ref 5–15)
BUN: 21 mg/dL (ref 8–23)
CHLORIDE: 103 mmol/L (ref 98–111)
CO2: 28 mmol/L (ref 22–32)
CREATININE: 1.39 mg/dL — AB (ref 0.61–1.24)
Calcium: 8.9 mg/dL (ref 8.9–10.3)
GFR calc Af Amer: 55 mL/min — ABNORMAL LOW (ref >60)
GFR calc non Af Amer: 48 mL/min — ABNORMAL LOW (ref >60)
GLUCOSE: 147 mg/dL — AB (ref 70–99)
POTASSIUM: 3.4 mmol/L — AB (ref 3.5–5.1)
SODIUM: 140 mmol/L (ref 135–145)

## 2017-12-29 LAB — DIGOXIN LEVEL: Digoxin Level: 0.5 ng/mL — ABNORMAL LOW (ref 0.8–2.0)

## 2017-12-29 MED ORDER — POTASSIUM CHLORIDE CRYS ER 20 MEQ PO TBCR: 40.0000 meq | EXTENDED_RELEASE_TABLET | Freq: Every day | ORAL | 3 refills | Status: AC

## 2017-12-29 MED ORDER — METOLAZONE 2.5 MG PO TABS: ORAL_TABLET | ORAL | 3 refills | Status: DC

## 2017-12-29 MED ORDER — TORSEMIDE 100 MG PO TABS: 100.0000 mg | ORAL_TABLET | Freq: Two times a day (BID) | ORAL | 3 refills | Status: AC

## 2017-12-29 NOTE — Telephone Encounter (Signed)
Pt called with lab results with medication change Increase total daily K by 20 mEq pt verbalized understanding

## 2017-12-29 NOTE — Patient Instructions (Signed)
INCREASE LAsix to 100mg  twice daily.  Take Metolazone 2.5mg  tomorrow morning.  *Take extra 54meq of Potassium with Metolazone*  Routine lab work today. Will notify you of abnormal results  Your provider requests you have an echo.  Follow up with Dr.McLean next week.

## 2017-12-31 NOTE — Progress Notes (Signed)
PCP: Dr. Quay Burow Cardiologist: Dr Aundra Dubin   HPI: [name] is a 77 y.o. with history of CAD s/p CABG, permanent atrial fib, S/P AAA repair, ischemic cardiomyopathy/chronic systolic heart failure,  Medtronic CRT-D system.   Admitted 4/14 through 07/06/16 with volume overload. Diuresed with IV lasix and transitioned to torsemide 60 mg daily. HF meds adjusted and he was started on Entresto. Discharge weight was 220 pounds.  He now has a Cardiomems device. Torsemide has been increased to 80 mg bid based on Cardiomems evaluation.   Echo in 4/19 showed EF improved to 40-45%.  He stopped eplerenone due to breast pain which has resolved (same thing with spironolactone).   RHC was done in 7/19, PA pressure from [place] matched Cardiomems (there had been some question of Cardiomems accuracy).   He returns for followup of CHF and CAD.  He remains in atrial fibrillation (permanent).  Goal PADP is now 23-28, he has been running on the upper end of this range and thoracic impedance low on Medtronic device.  Weight is up 4 lbs.  He has not been doing well recently.  For 1-2 weeks, he has been more short of breath, now to the point where he is short of breath walking around the house.  Sleeps on side, no change.  SBP 120s-140s.  He has also noted some tightness of his chest but it is more on the right side and not related to exertion.  Wife is worried about him, feels like CHF symptoms are much worse than his usual fluctuation.    PADP 26-28 mmHg by CardioMems recently.  Medtronic device interrogation: 35% RV pacing, thoracic impedance low and fluid index has reached threshold.  No VT.   ECG (personally reviewed): Atrial fibrillation, LBBB-like IVCD 164 msec   Labs (5/18): K 4.7, creatinine 1.7, hgb 15.8, BNP 1001, LDL 71 Labs (8/18): K 4.3, creatinine 1.56, hgb 14.3 Labs (9/18): K 4, creatinine 1.79, digoxin 0.8 Labs (10/18): digoxin 0.3 Labs (11/18): K 4, creatinine 1.71 Labs (4/19): K 3.6, creatinine 1.45,  LDL 52, HDL 42 Labs (5/19): K 4.1, creatinine 1.71 Labs (8/19): K 4.2 => 3.9, creatinine 1.5 => 1.48, hgb 14.8, digoxin 0.5  PMH: 1. CAD: s/p CABG.  - LHC (12/17): totally occluded distal RCA, totally occluded mid LAD, totally occluded OM2 and OM3.  LIMA-LAD patent with 60% ostial stenosis.  SVG-OM3 and SVG-PDA were patent. Medical management.  2. Chronic systolic CHF: Ischemic cardiomyopathy.  He has a Medtronic CRT-D device but LV lead not turned on due to high threshold.  - Echo (12/17) with EF 35-40%, diffuse hypokinesis, normal RV size and systolic function, mild MR.  - CardioMems placed.  - Painful gynecomastia with spironolactone and eplerenone.  - Echo (4/19) with EF 40-45%, mild MR.  - RHC (7/19) with mean RA 9, PA 43/21 mean 30, mean PCWP 19, CI 2.15, PVR 2.3 WU (PA pressure matched Cardiomems).  3. AAA: s/p repair.  4. Atrial fibrillation: Permanent.  5. Gout 6. PAD: Saw Dr Scot Dock with [facility] in 4/18.  Noninvasive study concerning for infra-inguinal arterial occlusive disease bilaterally but appeared to have adequate circulation to heal toe ulcer.  7. CKD stage 3.  8. Ascending aortic aneurysm: CT chest 3/18 ascending aorta 4.9 cm, arch 4.4 cm.  - CTA chest (5/19): 5.2 cm ascending aorta.  9. Spinal stenosis 10. Polycythemia vera 11. Hyperlipidemia 12. Type II diabetes    ROS: All systems negative except as listed in HPI, PMH and Problem List.  Social  History   Socioeconomic History  . Marital status: Married    Spouse name: Not on file  . Number of children: Not on file  . Years of education: Not on file  . Highest education level: Not on file  Occupational History  . Occupation: Retired- Nurse, mental health  Social Needs  . Financial resource strain: Not on file  . Food insecurity:    Worry: Not on file    Inability: Not on file  . Transportation needs:    Medical: Not on file    Non-medical: Not on file  Tobacco Use  . Smoking status: Former Smoker     Packs/day: 3.00    Years: 39.00    Pack years: 117.00    Types: Cigarettes    Start date: 05/24/1956    Last attempt to quit: 03/22/1995    Years since quitting: 22.7  . Smokeless tobacco: Never Used  Substance and Sexual Activity  . Alcohol use: Yes    Alcohol/week: 2.0 standard drinks    Types: 2 Cans of beer per week  . Drug use: No  . Sexual activity: Never  Lifestyle  . Physical activity:    Days per week: Not on file    Minutes per session: Not on file  . Stress: Not on file  Relationships  . Social connections:    Talks on phone: Not on file    Gets together: Not on file    Attends religious service: Not on file    Active member of club or organization: Not on file    Attends meetings of clubs or organizations: Not on file    Relationship status: Not on file  . Intimate partner violence:    Fear of current or ex partner: Not on file    Emotionally abused: Not on file    Physically abused: Not on file    Forced sexual activity: Not on file  Other Topics Concern  . Not on file  Social History Narrative  . Not on file    Family History  Problem Relation Age of Onset  . Hypertension Mother   . Stroke Mother   . Diabetes Father   . Coronary artery disease Father   . Other Father        DVT  . Diabetes Brother   . Colon cancer Neg Hx   . Heart attack Neg Hx      Current Outpatient Medications  Medication Sig Dispense Refill  . ACCU-CHEK AVIVA PLUS test strip USE 1 STRIP TO CHECK GLUCOSE THREE TIMES DAILY AS DIRECTED 300 each 3  . ACCU-CHEK FASTCLIX LANCETS MISC USE 1 LANCET TO CHECK GLUCOSE THREE TIMES DAILY 102 each 5  . acetaminophen (TYLENOL) 325 MG tablet Take 650-975 mg by mouth 4 (four) times daily as needed for moderate pain.     Marland Kitchen allopurinol (ZYLOPRIM) 100 MG tablet TAKE TWO TABLETS BY MOUTH ONCE DAILY 180 tablet 3  . amLODipine (NORVASC) 5 MG tablet Take 1 tablet (5 mg total) by mouth daily. 30 tablet 6  . atorvastatin (LIPITOR) 10 MG tablet Take 1  tablet (10 mg total) by mouth daily. 90 tablet 2  . carboxymethylcellulose (REFRESH PLUS) 0.5 % SOLN Place 2 drops into both eyes daily.     . carvedilol (COREG) 25 MG tablet TAKE 1 TABLET BY MOUTH TWICE DAILY WITH A MEAL 60 tablet 11  . cholecalciferol (VITAMIN D) 1000 units tablet Take 1,000 Units by mouth daily.    . colchicine 0.6  MG tablet Take 0.6 mg by mouth daily as needed (gout flares).     Marland Kitchen digoxin (LANOXIN) 0.125 MG tablet Take 0.5 tablets (0.0625 mg total) by mouth daily. 15 tablet 6  . DULoxetine (CYMBALTA) 30 MG capsule Take 1 capsule (30 mg total) by mouth daily. 90 capsule 1  . gabapentin (NEURONTIN) 300 MG capsule Take 2 capsules (600 mg total) by mouth 3 (three) times daily. TAKE ONE CAPSULE BY MOUTH 5 TIMES DAILY AS NEEDED FOR LEG PAIN (Patient taking differently: Take 300-600 mg by mouth See admin instructions. TAKE 2 CAPSULES (600 MG) IN THE MORNING, 2 CAPSULES (600 MG) IN THE EVENING, & 1 CAPSULE (300 MG) BY MOUTH AT BEDTIME.) 540 capsule 3  . gabapentin (NEURONTIN) 300 MG capsule TAKE 1 CAPSULE BY MOUTH FIVE TIMES DAILY AS NEEDED LEG  PAIN 450 capsule 3  . insulin NPH Human (NOVOLIN N) 100 UNIT/ML injection Inject 6-8 Units into the skin See admin instructions. Inject 8 units subcutaneously in the morning and inject 6 units subcutaneously in the evening.    . insulin regular (NOVOLIN R) 100 units/mL injection Inject 6 Units into the skin daily before supper.     Marland Kitchen ipratropium (ATROVENT) 0.06 % nasal spray Place 2 sprays into both nostrils daily.     Marland Kitchen lidocaine (LIDODERM) 5 % Place 1 patch onto the skin daily as needed (for pain.).     Marland Kitchen methocarbamol (ROBAXIN) 750 MG tablet Take 1 tablet (750 mg total) by mouth 2 (two) times daily as needed for muscle spasms. 90 tablet 1  . metroNIDAZOLE (METROGEL) 0.75 % gel Apply 1 application topically daily as needed. FOR ROSACEA  2  . nitroGLYCERIN (NITROSTAT) 0.4 MG SL tablet Place 1 tablet (0.4 mg total) under the tongue every 5 (five)  minutes as needed for chest pain (MAX 3 TABLETS). 25 tablet 3  . omeprazole (PRILOSEC) 20 MG capsule Take 2 capsules (40 mg total) by mouth 2 (two) times daily. 120 capsule 11  . sacubitril-valsartan (ENTRESTO) 24-26 MG Take 1 tablet by mouth 2 (two) times daily. 60 tablet 11  . torsemide (DEMADEX) 100 MG tablet Take 1 tablet (100 mg total) by mouth 2 (two) times daily. 60 tablet 3  . traMADol (ULTRAM) 50 MG tablet TAKE TWO TABLETS BY MOUTH EVERY 8 HOURS AS NEEDED FOR PAIN. 180 tablet 1  . warfarin (COUMADIN) 2.5 MG tablet TAKE 2 TABLETS (5 MG) BY MOUTH ON TUESDAYS AND SATURDAYS, THEN TAKE 1 TABLET (2.5 MG) BY MOUTH ON ALL OTHER DAYS. 120 tablet 1  . metolazone (ZAROXOLYN) 2.5 MG tablet Take only as prescribed by CHF clinic. 10 tablet 3  . potassium chloride SA (K-DUR,KLOR-CON) 20 MEQ tablet Take 2 tablets (40 mEq total) by mouth daily. 90 tablet 3   No current facility-administered medications for this encounter.     Vitals:   12/29/17 1010  BP: 112/77  Pulse: (!) 40  SpO2: 100%  Weight: 103.6 kg (228 lb 6.4 oz)    PHYSICAL EXAM: General: NAD Neck: JVP 8 cm, no thyromegaly or thyroid nodule.  Lungs: Clear to auscultation bilaterally with normal respiratory effort. CV: Lateral PMI.  Heart irregular S1/S2, no S3/S4, no murmur.  1+ edema to knees bilaterally.  No carotid bruit.  Normal pedal pulses.  Abdomen: Soft, nontender, no hepatosplenomegaly, no distention.  Skin: Intact without lesions or rashes.  Neurologic: Alert and oriented x 3.  Psych: Normal affect. Extremities: No clubbing or cyanosis.  HEENT: Normal.    ASSESSMENT &  PLAN: 1. Chronic Systolic Heart Failure: Ischemic cardiomyopathy. Echo 4/19 with EF 40-45%, some improvement. Medtronic CRT-D but LV lead turned off due to high threshold.  PADP rising by Cardiomems and thoracic impedance low on Optivol, correlating with increased exertional dyspnea over the last week or so.  On exam, he is volume overloaded, though not as  markedly as I would have expected with his degree of symptoms.  NYHA class IIIb today.   - Increase torsemide to 100 mg bid.  Continue to follow very low Na diet.  He will take a dose of metolazone with am torsemide tomorrow morning along with an extra KCl 20 mEq.  BMET/BNP today and repeat BMET in 7 days.  - Continue digoxin, check level today.  - Continue current Entresto, unable to increase due to significant lightheadedness on higher dose.  - Unable to take eplerenone or spironolactone due to breast tenderness.  This has mostly resolved off these meds.   - Continue Coreg 25 mg bid.  - I will arrange for repeat echo.  - Lower PADP goal by Cardiomems to < 26.   - Patient is RV pacing about 35% of the time.  When he is not RV pacing, his native QRS is a LBBB-like IVCD at 164 msec.  He has an LV lead, but it has been turned off due to high threshold.  I turned the LV lead on today with enough output to allow pacing.  LV pacing did not appear to narrow the QRS.  Due to high threshold, leaving the LV lead on would deplete the battery fast.  I discussed the situation with Dr. Lovena Le.  He does not think that a percutaneous revision of the LV lead would be helpful.  Surgical placement of epicardial lead would be the only option.  I will discuss with patient at followup.  2. Atrial fibrillation: Permanent.  Rate-controlled.  Continue warfarin.   3. CAD: s/p CABG.  He has atypical chest pain.  - No ASA given warfarin use.  - Continue statin.  Good lipids 4/19.   - Given worsening dyspnea, would consider RHC/LHC if increased diuresis over the next few days does not help symptoms.   4. CKD Stage III: BMET today and in another week.  5. Gout: stable. Continue allopurinol.  6. Ascending aortic aneurysm: 5/19 CT chest with 5.2 cm ascending aorta.  He saw Dr. Servando Snare.  Aneurysm has been relatively stable, would be high risk for operation.  For now, will monitor with no plans for surgery.   - Keep SBP < 130 -  Will get repeat CT chest w/o contrast in 11/19 => through Dr. Everrett Coombe office.    Followup next week.   Loralie Champagne  12/31/2017

## 2018-01-03 DIAGNOSIS — H33321 Round hole, right eye: Secondary | ICD-10-CM | POA: Diagnosis not present

## 2018-01-03 DIAGNOSIS — H353131 Nonexudative age-related macular degeneration, bilateral, early dry stage: Secondary | ICD-10-CM | POA: Diagnosis not present

## 2018-01-03 DIAGNOSIS — H43813 Vitreous degeneration, bilateral: Secondary | ICD-10-CM | POA: Diagnosis not present

## 2018-01-04 ENCOUNTER — Ambulatory Visit (HOSPITAL_COMMUNITY)
Admission: RE | Admit: 2018-01-04 | Discharge: 2018-01-04 | Disposition: A | Discharge status: Home / Self Care | Payer: Medicare Other | Source: Ambulatory Visit | Attending: Cardiology | Admitting: Cardiology

## 2018-01-04 ENCOUNTER — Ambulatory Visit: Payer: Medicare Other | Admitting: Hematology

## 2018-01-04 ENCOUNTER — Other Ambulatory Visit: Payer: Medicare Other

## 2018-01-04 ENCOUNTER — Ambulatory Visit (HOSPITAL_BASED_OUTPATIENT_CLINIC_OR_DEPARTMENT_OTHER)
Admission: RE | Admit: 2018-01-04 | Discharge: 2018-01-04 | Disposition: A | Discharge status: Home / Self Care | Payer: Medicare Other | Source: Ambulatory Visit | Attending: Cardiology | Admitting: Cardiology

## 2018-01-04 VITALS — BP 125/60 | HR 57 | Wt 226.2 lb

## 2018-01-04 DIAGNOSIS — Z8249 Family history of ischemic heart disease and other diseases of the circulatory system: Secondary | ICD-10-CM | POA: Insufficient documentation

## 2018-01-04 DIAGNOSIS — Z79891 Long term (current) use of opiate analgesic: Secondary | ICD-10-CM | POA: Insufficient documentation

## 2018-01-04 DIAGNOSIS — I712 Thoracic aortic aneurysm, without rupture: Secondary | ICD-10-CM | POA: Insufficient documentation

## 2018-01-04 DIAGNOSIS — Z823 Family history of stroke: Secondary | ICD-10-CM | POA: Diagnosis not present

## 2018-01-04 DIAGNOSIS — I340 Nonrheumatic mitral (valve) insufficiency: Secondary | ICD-10-CM | POA: Insufficient documentation

## 2018-01-04 DIAGNOSIS — Z794 Long term (current) use of insulin: Secondary | ICD-10-CM | POA: Insufficient documentation

## 2018-01-04 DIAGNOSIS — Z7901 Long term (current) use of anticoagulants: Secondary | ICD-10-CM | POA: Diagnosis not present

## 2018-01-04 DIAGNOSIS — E785 Hyperlipidemia, unspecified: Secondary | ICD-10-CM | POA: Insufficient documentation

## 2018-01-04 DIAGNOSIS — Z87891 Personal history of nicotine dependence: Secondary | ICD-10-CM | POA: Diagnosis not present

## 2018-01-04 DIAGNOSIS — N183 Chronic kidney disease, stage 3 (moderate): Secondary | ICD-10-CM | POA: Diagnosis not present

## 2018-01-04 DIAGNOSIS — I4821 Permanent atrial fibrillation: Secondary | ICD-10-CM | POA: Insufficient documentation

## 2018-01-04 DIAGNOSIS — I255 Ischemic cardiomyopathy: Secondary | ICD-10-CM | POA: Diagnosis not present

## 2018-01-04 DIAGNOSIS — Z951 Presence of aortocoronary bypass graft: Secondary | ICD-10-CM | POA: Insufficient documentation

## 2018-01-04 DIAGNOSIS — Z79899 Other long term (current) drug therapy: Secondary | ICD-10-CM | POA: Diagnosis not present

## 2018-01-04 DIAGNOSIS — I5022 Chronic systolic (congestive) heart failure: Secondary | ICD-10-CM | POA: Diagnosis not present

## 2018-01-04 DIAGNOSIS — E1122 Type 2 diabetes mellitus with diabetic chronic kidney disease: Secondary | ICD-10-CM | POA: Insufficient documentation

## 2018-01-04 LAB — BASIC METABOLIC PANEL
Anion gap: 9 (ref 5–15)
BUN: 41 mg/dL — AB (ref 8–23)
CALCIUM: 8.5 mg/dL — AB (ref 8.9–10.3)
CO2: 27 mmol/L (ref 22–32)
CREATININE: 1.77 mg/dL — AB (ref 0.61–1.24)
Chloride: 101 mmol/L (ref 98–111)
GFR calc Af Amer: 41 mL/min — ABNORMAL LOW (ref >60)
GFR calc non Af Amer: 36 mL/min — ABNORMAL LOW (ref >60)
GLUCOSE: 145 mg/dL — AB (ref 70–99)
Potassium: 3.5 mmol/L (ref 3.5–5.1)
Sodium: 137 mmol/L (ref 135–145)

## 2018-01-04 NOTE — Patient Instructions (Signed)
Labs today  You have been referred to Dr Cyndia Bent, they will call you to schedule  Your physician recommends that you schedule a follow-up appointment in: 2-3 months

## 2018-01-04 NOTE — Progress Notes (Signed)
PCP: Dr. Quay [name] Cardiologist: Dr [name]   HPI: [name] is a 77 y.o. with history of CAD s/p CABG, permanent atrial fib, S/P AAA repair, ischemic cardiomyopathy/chronic systolic heart failure,  Medtronic CRT-D system.   Admitted 4/14 through 07/06/16 with volume overload. Diuresed with IV lasix and transitioned to torsemide 60 mg daily. HF meds adjusted and he was started on Entresto. Discharge weight was 220 pounds.  He now has a Cardiomems device. Torsemide has been increased to 80 mg bid based on Cardiomems evaluation.   Echo in 4/19 showed EF improved to 40-45%.  He stopped eplerenone due to breast pain which has resolved (same thing with spironolactone).   RHC was done in 7/19, PA pressure from [place] matched Cardiomems (there had been some question of Cardiomems accuracy).   He returns for followup of CHF and CAD.  He remains in atrial fibrillation (permanent).  Last week, he was seen in the office and noted to be volume overloaded.  I gave him a dose of metolazone and increased his torsemide to 100 mg bid. Today, weight is down 2 lbs.  PADP is 20 on Cardiomems. Goal at this point is 20-25.  Breathing is improved, back to baseline.  He can walk on flat ground for 100 feet or so without dyspnea.  Still has generalized fatigue.  Rare atypical chest pain.  No orthopnea/PND.   PADP 20 by Cardiomems  ECG (10/19): Atrial fibrillation, LBBB-like IVCD 164 msec   Labs (5/18): K 4.7, creatinine 1.7, hgb 15.8, BNP 1001, LDL 71 Labs (8/18): K 4.3, creatinine 1.56, hgb 14.3 Labs (9/18): K 4, creatinine 1.79, digoxin 0.8 Labs (10/18): digoxin 0.3 Labs (11/18): K 4, creatinine 1.71 Labs (4/19): K 3.6, creatinine 1.45, LDL 52, HDL 42 Labs (5/19): K 4.1, creatinine 1.71 Labs (8/19): K 4.2 => 3.9, creatinine 1.5 => 1.48, hgb 14.8, digoxin 0.5 Labs (10/19): hgb 14.1, digoxin 0.5, BNP 1274, K 3.4, creatinine 1.39  PMH: 1. CAD: s/p CABG.  - LHC (12/17): totally occluded distal RCA, totally occluded mid  LAD, totally occluded OM2 and OM3.  LIMA-LAD patent with 60% ostial stenosis.  SVG-OM3 and SVG-PDA were patent. Medical management.  2. Chronic systolic CHF: Ischemic cardiomyopathy.  He has a Medtronic CRT-D device but LV lead not turned on due to high threshold.  - Echo (12/17) with EF 35-40%, diffuse hypokinesis, normal RV size and systolic function, mild MR.  - CardioMems placed.  - Painful gynecomastia with spironolactone and eplerenone.  - Echo (4/19) with EF 40-45%, mild MR.  - RHC (7/19) with mean RA 9, PA 43/21 mean 30, mean PCWP 19, CI 2.15, PVR 2.3 WU (PA pressure matched Cardiomems).  - Echo (10/19): EF 35-40%, mild LVH, septal-lateral dyssynchrony, grade II diastolic dysfunction, moderate MR, mildly dilated RV with mildly decreased systolic function 3. AAA: s/p repair.  4. Atrial fibrillation: Permanent.  5. Gout 6. PAD: Saw Dr [name] with [facility] in 4/18.  Noninvasive study concerning for infra-inguinal arterial occlusive disease bilaterally but appeared to have adequate circulation to heal toe ulcer.  7. CKD stage 3.  8. Ascending aortic aneurysm: CT chest 3/18 ascending aorta 4.9 cm, arch 4.4 cm.  - CTA chest (5/19): 5.2 cm ascending aorta.  9. Spinal stenosis 10. Polycythemia vera 11. Hyperlipidemia 12. Type II diabetes    ROS: All systems negative except as listed in HPI, PMH and Problem List.  Social History   Socioeconomic History  . Marital status: Married    Spouse name: Not on file  .  Number of children: Not on file  . Years of education: Not on file  . Highest education level: Not on file  Occupational History  . Occupation: Retired- Nurse, mental health  Social Needs  . Financial resource strain: Not on file  . Food insecurity:    Worry: Not on file    Inability: Not on file  . Transportation needs:    Medical: Not on file    Non-medical: Not on file  Tobacco Use  . Smoking status: Former Smoker    Packs/day: 3.00    Years: 39.00    Pack years:  117.00    Types: Cigarettes    Start date: 05/24/1956    Last attempt to quit: 03/22/1995    Years since quitting: 22.8  . Smokeless tobacco: Never Used  Substance and Sexual Activity  . Alcohol use: Yes    Alcohol/week: 2.0 standard drinks    Types: 2 Cans of beer per week  . Drug use: No  . Sexual activity: Never  Lifestyle  . Physical activity:    Days per week: Not on file    Minutes per session: Not on file  . Stress: Not on file  Relationships  . Social connections:    Talks on phone: Not on file    Gets together: Not on file    Attends religious service: Not on file    Active member of club or organization: Not on file    Attends meetings of clubs or organizations: Not on file    Relationship status: Not on file  . Intimate partner violence:    Fear of current or ex partner: Not on file    Emotionally abused: Not on file    Physically abused: Not on file    Forced sexual activity: Not on file  Other Topics Concern  . Not on file  Social History Narrative  . Not on file    Family History  Problem Relation Age of Onset  . Hypertension Mother   . Stroke Mother   . Diabetes Father   . Coronary artery disease Father   . Other Father        DVT  . Diabetes Brother   . Colon cancer Neg Hx   . Heart attack Neg Hx      Current Outpatient Medications  Medication Sig Dispense Refill  . ACCU-CHEK AVIVA PLUS test strip USE 1 STRIP TO CHECK GLUCOSE THREE TIMES DAILY AS DIRECTED 300 each 3  . ACCU-CHEK FASTCLIX LANCETS MISC USE 1 LANCET TO CHECK GLUCOSE THREE TIMES DAILY 102 each 5  . acetaminophen (TYLENOL) 325 MG tablet Take 650-975 mg by mouth 4 (four) times daily as needed for moderate pain.     Marland Kitchen allopurinol (ZYLOPRIM) 100 MG tablet TAKE TWO TABLETS BY MOUTH ONCE DAILY 180 tablet 3  . amLODipine (NORVASC) 5 MG tablet Take 1 tablet (5 mg total) by mouth daily. 30 tablet 6  . atorvastatin (LIPITOR) 10 MG tablet Take 1 tablet (10 mg total) by mouth daily. 90 tablet 2   . carboxymethylcellulose (REFRESH PLUS) 0.5 % SOLN Place 2 drops into both eyes daily.     . carvedilol (COREG) 25 MG tablet TAKE 1 TABLET BY MOUTH TWICE DAILY WITH A MEAL 60 tablet 11  . cholecalciferol (VITAMIN D) 1000 units tablet Take 1,000 Units by mouth daily.    . colchicine 0.6 MG tablet Take 0.6 mg by mouth daily as needed (gout flares).     Marland Kitchen digoxin (LANOXIN) 0.125  MG tablet Take 0.5 tablets (0.0625 mg total) by mouth daily. 15 tablet 6  . DULoxetine (CYMBALTA) 30 MG capsule Take 1 capsule (30 mg total) by mouth daily. 90 capsule 1  . gabapentin (NEURONTIN) 300 MG capsule Take 2 capsules (600 mg total) by mouth 3 (three) times daily. TAKE ONE CAPSULE BY MOUTH 5 TIMES DAILY AS NEEDED FOR LEG PAIN (Patient taking differently: Take 300-600 mg by mouth See admin instructions. TAKE 2 CAPSULES (600 MG) IN THE MORNING, 2 CAPSULES (600 MG) IN THE EVENING, & 1 CAPSULE (300 MG) BY MOUTH AT BEDTIME.) 540 capsule 3  . gabapentin (NEURONTIN) 300 MG capsule TAKE 1 CAPSULE BY MOUTH FIVE TIMES DAILY AS NEEDED LEG  PAIN 450 capsule 3  . insulin NPH Human (NOVOLIN N) 100 UNIT/ML injection Inject 6-8 Units into the skin See admin instructions. Inject 8 units subcutaneously in the morning and inject 6 units subcutaneously in the evening.    . insulin regular (NOVOLIN R) 100 units/mL injection Inject 6 Units into the skin daily before supper.     Marland Kitchen ipratropium (ATROVENT) 0.06 % nasal spray Place 2 sprays into both nostrils daily.     Marland Kitchen lidocaine (LIDODERM) 5 % Place 1 patch onto the skin daily as needed (for pain.).     Marland Kitchen methocarbamol (ROBAXIN) 750 MG tablet Take 1 tablet (750 mg total) by mouth 2 (two) times daily as needed for muscle spasms. 90 tablet 1  . metolazone (ZAROXOLYN) 2.5 MG tablet Take only as prescribed by CHF clinic. 10 tablet 3  . metroNIDAZOLE (METROGEL) 0.75 % gel Apply 1 application topically daily as needed. FOR ROSACEA  2  . nitroGLYCERIN (NITROSTAT) 0.4 MG SL tablet Place 1 tablet (0.4  mg total) under the tongue every 5 (five) minutes as needed for chest pain (MAX 3 TABLETS). 25 tablet 3  . omeprazole (PRILOSEC) 20 MG capsule Take 2 capsules (40 mg total) by mouth 2 (two) times daily. 120 capsule 11  . potassium chloride SA (K-DUR,KLOR-CON) 20 MEQ tablet Take 2 tablets (40 mEq total) by mouth daily. 90 tablet 3  . sacubitril-valsartan (ENTRESTO) 24-26 MG Take 1 tablet by mouth 2 (two) times daily. 60 tablet 11  . torsemide (DEMADEX) 100 MG tablet Take 1 tablet (100 mg total) by mouth 2 (two) times daily. 60 tablet 3  . traMADol (ULTRAM) 50 MG tablet TAKE TWO TABLETS BY MOUTH EVERY 8 HOURS AS NEEDED FOR PAIN. 180 tablet 1  . warfarin (COUMADIN) 2.5 MG tablet TAKE 2 TABLETS (5 MG) BY MOUTH ON TUESDAYS AND SATURDAYS, THEN TAKE 1 TABLET (2.5 MG) BY MOUTH ON ALL OTHER DAYS. 120 tablet 1   No current facility-administered medications for this encounter.     Vitals:   01/04/18 1257  BP: 125/60  Pulse: (!) 57  SpO2: 99%  Weight: 102.6 kg (226 lb 4 oz)    PHYSICAL EXAM: General: NAD Neck: JVP 8 cm, no thyromegaly or thyroid nodule.  Lungs: Clear to auscultation bilaterally with normal respiratory effort. CV: Nondisplaced PMI.  Heart regular S1/S2, no S3/S4, no murmur.  Trace ankle edema.  No carotid bruit.  Normal pedal pulses.  Abdomen: Soft, nontender, no hepatosplenomegaly, no distention.  Skin: Intact without lesions or rashes.  Neurologic: Alert and oriented x 3.  Psych: Normal affect. Extremities: No clubbing or cyanosis.  HEENT: Normal.    ASSESSMENT & PLAN: 1. Chronic Systolic Heart Failure: Ischemic cardiomyopathy. Echo was done today and reviewed, EF 35-40%, stable. Medtronic CRT-D but LV lead  turned off due to high threshold.  Weight is down today and PADP is down to 20 by Cardiomems. Volume status looks ok on exam. Symptomatically improved.    - Continue torsemide 100 mg bid.  Continue to follow very low Na diet.  BMET today.  - Continue digoxin, recent level  ok.   - Continue current Entresto, unable to increase due to significant lightheadedness on higher dose.  - Unable to take eplerenone or spironolactone due to breast tenderness.  This has mostly resolved off these meds.   - Continue Coreg 25 mg bid.  - PADP goal by Cardiomems = 20-25.   - Patient is RV pacing about 35% of the time.  When he is not RV pacing, his native QRS is a LBBB-like IVCD at 164 msec.  He has an LV lead, but it has been turned off due to high threshold.  I turned the LV lead on at last appointment with enough output to allow pacing.  LV pacing did not appear to narrow the QRS.  Due to high threshold, leaving the LV lead on would deplete the battery fast.  I discussed the situation with Dr. Lovena Le.  He does not think that a percutaneous revision of the LV lead would be helpful.  Surgical placement of epicardial lead would be the only option.  I discussed this with Mr Barner today.  LV pacing has the potential to improve his symptoms.  I will refer him to Dr. Cyndia Bent for evaluation for epicardial LV lead.   2. Atrial fibrillation: Permanent.  Rate-controlled.  Continue warfarin.   3. CAD: s/p CABG.  He has atypical chest pain.  - No ASA given warfarin use.  - Continue statin.  Good lipids 4/19.   - Would hold off on coronary angiography as echo today showed fairly stable systolic function.   4. CKD Stage III: BMET today. 5. Gout: stable. Continue allopurinol.  6. Ascending aortic aneurysm: 5/19 CT chest with 5.2 cm ascending aorta.  He saw Dr. Servando Snare.  Aneurysm has been relatively stable, would be high risk for operation.  For now, will monitor with no plans for surgery.   - Keep SBP < 130 - Will get repeat CT chest w/o contrast in 11/19 => through Dr. Everrett Coombe office.    Followup with APP in 1 month, see me in 2 month.   Loralie Champagne  01/04/2018

## 2018-01-04 NOTE — Progress Notes (Signed)
  Echocardiogram 2D Echocardiogram has been performed.  Johny Chess 01/04/2018, 12:59 PM

## 2018-01-10 ENCOUNTER — Ambulatory Visit (HOSPITAL_COMMUNITY)
Admission: RE | Admit: 2018-01-10 | Discharge: 2018-01-10 | Disposition: A | Discharge status: Home / Self Care | Payer: Medicare Other | Source: Ambulatory Visit | Attending: Internal Medicine | Admitting: Internal Medicine

## 2018-01-10 DIAGNOSIS — I5022 Chronic systolic (congestive) heart failure: Secondary | ICD-10-CM | POA: Insufficient documentation

## 2018-01-10 LAB — BASIC METABOLIC PANEL
Anion gap: 11 (ref 5–15)
BUN: 27 mg/dL — ABNORMAL HIGH (ref 8–23)
CALCIUM: 9 mg/dL (ref 8.9–10.3)
CHLORIDE: 105 mmol/L (ref 98–111)
CO2: 26 mmol/L (ref 22–32)
Creatinine, Ser: 1.48 mg/dL — ABNORMAL HIGH (ref 0.61–1.24)
GFR calc non Af Amer: 44 mL/min — ABNORMAL LOW (ref >60)
GFR, EST AFRICAN AMERICAN: 51 mL/min — AB (ref >60)
Glucose, Bld: 152 mg/dL — ABNORMAL HIGH (ref 70–99)
Potassium: 3.7 mmol/L (ref 3.5–5.1)
SODIUM: 142 mmol/L (ref 135–145)

## 2018-01-23 ENCOUNTER — Encounter: Payer: Self-pay | Admitting: Surgery

## 2018-01-23 ENCOUNTER — Institutional Professional Consult (permissible substitution): Payer: Medicare Other | Admitting: Surgery

## 2018-01-23 VITALS — BP 119/76 | HR 61 | Resp 20 | Ht 72.0 in | Wt 218.0 lb

## 2018-01-23 DIAGNOSIS — Z951 Presence of aortocoronary bypass graft: Secondary | ICD-10-CM | POA: Diagnosis not present

## 2018-01-23 DIAGNOSIS — I712 Thoracic aortic aneurysm, without rupture: Secondary | ICD-10-CM

## 2018-01-23 DIAGNOSIS — I5022 Chronic systolic (congestive) heart failure: Secondary | ICD-10-CM

## 2018-01-23 DIAGNOSIS — I7121 Aneurysm of the ascending aorta, without rupture: Secondary | ICD-10-CM

## 2018-01-23 NOTE — [place] Notes (Signed)
Cardiothoracic Surgery Consultation  PCP is Quay Burow, Claudina Lick, MD Referring Provider is [name], MD  Chief Complaint  Patient presents with  . Congestive Heart Failure    Surgical eval for Epicardial LV lead placement, ECHO 01/04/18, Chest CT 07/24/17,  HX of CABG 2004     HPI:  The patient is a 77 year old gentleman with a history of diffuse vascular disease status post coronary bypass graft surgery in 2004 by me, abdominal aortic aneurysm repair by Dr. Amedeo Plenty in 2004, known 5.2 cm ascending aortic aneurysm, chronic atrial fibrillation on Coumadin, stage 3 CKD, COPD, chronotropic incompetence s/p PPM, ischemic cardiomyopathy with chronic systolic heart failure with an EF of 40% and grade 2 diastolic dysfunction. He currently has a [employer] CRT-D system. He has a Cardiomems device and has been followed closely by Dr. Aundra Dubin for management of his heart failure. He continues to have volume overload despite escalating doses of diuretic. He reports persistent generalized fatigue. His breathing is better with higher dose Torsemide and a dose of Metolazone a few weeks ago. He can walk on level ground but gets short of breath with any exertion or incline. Denies orthopnea and PND.   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 His coronary sinus LV lead had been turned off due to high threshold. Dr. Aundra Dubin turned it back on recently with increased output to allow pacing the LV but the QRS did not narrow and it was turned back off.  His last cath in 02/2016 showed severe native CAD with a patent LIMA to the LAD supplying a small to medium caliber distal LAD with 60% ostial LIMA stenosis. There was a patent SVG to the distal OM and a patent SVG to the PDA. He does complain of occasional chest pain that sounds atypical for angina.   Past Medical History:  Diagnosis Date  . AAA (abdominal aortic aneurysm) ([place])    Surgical repair 11/2002.  . Adenomatous colon polyp   .  Alcohol ingestion of more than four drinks per week    Excess beer  not a dependency problem  . Aortic valve sclerosis   . Arthritis   . Atrial fibrillation ([place])    Previous long-term amiodarone therapy with multiple cardioversions / amiodarone stopped September, 2009  . Atrial flutter [facility])    Started November, 2010, Left-sided and cannot ablate  . Bony abnormality    Patient's manubrium is slightly displaced to the right  . CAD (coronary artery disease)    a. s/p CABG 2004;  b. 02/2016 Cath: LM nl, LAD 70p, 140m, D1 50, D2 50, RI 50ost, LCX 73m, OM2 100, OM3 100, RCA 70p, 100d, VG->OM3 ok, LIMA->LAD 60ost, VG->RPDA  ok.  . 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 01/2013 MDT DTBA 1D1 [place] CRT-D, ser # [id number].  . Carotid artery disease ([place])    Doppler, December, 2013, 0-39% bilateral  . Chronic systolic CHF (congestive heart failure) ([place])    a. 02/2016 Echo: EF 35-40%, diff HK, triv AI, mildly dil Ao root 7mm, mild MR, sev dil LA, triv TR.  Marland Kitchen Chronotropic incompetence    IV pacing rate adjusted  . CKD (chronic kidney disease), stage III ([place])   . COPD (chronic obstructive pulmonary disease) ([place])   . Dilated aortic root ([place])   . Discolored skin   . Diverticulosis   . Drug therapy    Redness and swelling with Avelox  infusion May 24, 2011  . Eye abnormality    Ophthalmologist questions a clot in one of his eyes, May, 2012  . Gout   . Hyperlipidemia   . Hypertension   . Internal hemorrhoids   . Ischemic cardiomyopathy   . Left atrial thrombus    Remote past... cardioversions done since that time  . Mitral regurgitation    Mild echo  . Myocardial infarction ([place])   . Nasal drainage    Chronic  . Overweight(278.02)   . Pericardial effusion   . Pleural effusion    Large loculated effusion on the left side November, 2011. This was tapped. It was exudative. Cytology revealed no cancer no proof of mesothelioma area pulmonary team felt  that no further workup was needed  . Pleural thickening   . Pneumonia   . Polycythemia vera ([place]) 07/28/2014  . SOB (shortness of breath)    Large left effusion/ thoracentesis/hospitalization/November, 2011... exudated.. cytology negative.. Dr.Wert.. no proof of mesothelioma  . Spinal stenosis    Surgery Dr.Elsner  . Thrombophlebitis of superficial veins of upper extremities    Possible venous stenosis from defibrillator  . Type II diabetes mellitus ([place]) 2008  . Venous insufficiency    Toe discoloration chronic  . Ventral hernia    April, 2014, result of his abdominal surgery  . Warfarin anticoagulation   . Wide-complex tachycardia ([place])     Past Surgical History:  Procedure Laterality Date  . ABDOMINAL AORTIC ANEURYSM REPAIR  11/2002  . ANTERIOR CERVICAL DECOMP/DISCECTOMY FUSION  1995  . BACK SURGERY    . BI-VENTRICULAR PACEMAKER INSERTION (CRT-P)  02-11-2013   Pt with previously implanted MDT CRTD downgraded to CRTP by Dr Lovena Le 02-11-13  . CARDIAC CATHETERIZATION  "several"  . CARDIAC CATHETERIZATION N/A 03/18/2016   Procedure: RIGHT/LEFT HEART CATH AND CORONARY/GRAFT ANGIOGRAPHY;  Surgeon: Nelva Bush, MD;  Location: [facility];  Service: Cardiovascular;  Laterality: N/A;  . CATARACT EXTRACTION W/ INTRAOCULAR LENS  IMPLANT, BILATERAL    . COLONOSCOPY W/ POLYPECTOMY    . CORONARY ANGIOPLASTY WITH STENT PLACEMENT  2004  . CORONARY ARTERY BYPASS GRAFT  2004   CABG X4  . FRACTURE SURGERY    . IMPLANTABLE CARDIOVERTER DEFIBRILLATOR (ICD) GENERATOR CHANGE N/A 02/11/2013   Procedure: ICD GENERATOR CHANGE;  Surgeon: Evans Lance, MD;  Location: [facility];  Service: Cardiovascular;  Laterality: N/A;  . INCISION AND DRAINAGE ABSCESS / HEMATOMA OF BURSA / KNEE / THIGH    . INSERT / REPLACE / REMOVE PACEMAKER  2009   original pacer/defibrillator; Dr. Lovena Le 2009... by the pacing  . LUMBAR FUSION  2010  . ORIF TIBIA & FIBULA FRACTURES Right 2000s  . PILONIDAL CYST  EXCISION    . RIGHT HEART CATH N/A 10/19/2016   Procedure: Right Heart Cath;  Surgeon: [name], MD;  Location: [facility];  Service: Cardiovascular;  Laterality: N/A;  . RIGHT HEART CATH N/A 09/28/2017   Procedure: RIGHT HEART CATH;  Surgeon: [name], MD;  Location: [facility];  Service: Cardiovascular;  Laterality: N/A;  . SPINAL CORD STIMULATOR IMPLANT  12/2011  . SURGERY SCROTAL / TESTICULAR      Family History  Problem Relation Age of Onset  . Hypertension Mother   . Stroke Mother   . Diabetes Father   . Coronary artery disease Father   . Other Father        DVT  . Diabetes Brother   . Colon  cancer Neg Hx   . Heart attack Neg Hx     Social History Social History   Tobacco Use  . Smoking status: Former Smoker    Packs/day: 3.00    Years: 39.00    Pack years: 117.00    Types: Cigarettes    Start date: 05/24/1956    Last attempt to quit: 03/22/1995    Years since quitting: 22.8  . Smokeless tobacco: Never Used  Substance Use Topics  . Alcohol use: Yes    Alcohol/week: 2.0 standard drinks    Types: 2 Cans of beer per week  . Drug use: No    Current Outpatient Medications  Medication Sig Dispense Refill  . ACCU-CHEK AVIVA PLUS test strip USE 1 STRIP TO CHECK GLUCOSE THREE TIMES DAILY AS DIRECTED 300 each 3  . ACCU-CHEK FASTCLIX LANCETS MISC USE 1 LANCET TO CHECK GLUCOSE THREE TIMES DAILY 102 each 5  . acetaminophen (TYLENOL) 325 MG tablet Take 650-975 mg by mouth 4 (four) times daily as needed for moderate pain.     Marland Kitchen allopurinol (ZYLOPRIM) 100 MG tablet TAKE TWO TABLETS BY MOUTH ONCE DAILY 180 tablet 3  . amLODipine (NORVASC) 5 MG tablet Take 1 tablet (5 mg total) by mouth daily. 30 tablet 6  . atorvastatin (LIPITOR) 10 MG tablet Take 1 tablet (10 mg total) by mouth daily. 90 tablet 2  . carboxymethylcellulose (REFRESH PLUS) 0.5 % SOLN Place 2 drops into both eyes daily.     . carvedilol (COREG) 25 MG tablet TAKE 1 TABLET BY MOUTH TWICE  DAILY WITH A MEAL 60 tablet 11  . cholecalciferol (VITAMIN D) 1000 units tablet Take 1,000 Units by mouth daily.    . colchicine 0.6 MG tablet Take 0.6 mg by mouth daily as needed (gout flares).     Marland Kitchen digoxin (LANOXIN) 0.125 MG tablet Take 0.5 tablets (0.0625 mg total) by mouth daily. 15 tablet 6  . DULoxetine (CYMBALTA) 30 MG capsule Take 1 capsule (30 mg total) by mouth daily. 90 capsule 1  . gabapentin (NEURONTIN) 300 MG capsule Take 2 capsules (600 mg total) by mouth 3 (three) times daily. TAKE ONE CAPSULE BY MOUTH 5 TIMES DAILY AS NEEDED FOR LEG PAIN (Patient taking differently: Take 300-600 mg by mouth See admin instructions. TAKE 2 CAPSULES (600 MG) IN THE MORNING, 2 CAPSULES (600 MG) IN THE EVENING, & 1 CAPSULE (300 MG) BY MOUTH AT BEDTIME.) 540 capsule 3  . gabapentin (NEURONTIN) 300 MG capsule TAKE 1 CAPSULE BY MOUTH FIVE TIMES DAILY AS NEEDED LEG  PAIN 450 capsule 3  . insulin NPH Human (NOVOLIN N) 100 UNIT/ML injection Inject 6-8 Units into the skin See admin instructions. Inject 8 units subcutaneously in the morning and inject 6 units subcutaneously in the evening.    . insulin regular (NOVOLIN R) 100 units/mL injection Inject 6 Units into the skin daily before supper.     Marland Kitchen ipratropium (ATROVENT) 0.06 % nasal spray Place 2 sprays into both nostrils daily.     . methocarbamol (ROBAXIN) 750 MG tablet Take 1 tablet (750 mg total) by mouth 2 (two) times daily as needed for muscle spasms. 90 tablet 1  . metolazone (ZAROXOLYN) 2.5 MG tablet Take only as prescribed by CHF clinic. 10 tablet 3  . metroNIDAZOLE (METROGEL) 0.75 % gel Apply 1 application topically daily as needed. FOR ROSACEA  2  . nitroGLYCERIN (NITROSTAT) 0.4 MG SL tablet Place 1 tablet (0.4 mg total) under the tongue every 5 (five) minutes as  needed for chest pain (MAX 3 TABLETS). 25 tablet 3  . omeprazole (PRILOSEC) 20 MG capsule Take 2 capsules (40 mg total) by mouth 2 (two) times daily. 120 capsule 11  . potassium chloride SA  (K-DUR,KLOR-CON) 20 MEQ tablet Take 2 tablets (40 mEq total) by mouth daily. 90 tablet 3  . sacubitril-valsartan (ENTRESTO) 24-26 MG Take 1 tablet by mouth 2 (two) times daily. 60 tablet 11  . torsemide (DEMADEX) 100 MG tablet Take 1 tablet (100 mg total) by mouth 2 (two) times daily. 60 tablet 3  . traMADol (ULTRAM) 50 MG tablet TAKE TWO TABLETS BY MOUTH EVERY 8 HOURS AS NEEDED FOR PAIN. 180 tablet 1  . warfarin (COUMADIN) 2.5 MG tablet TAKE 2 TABLETS (5 MG) BY MOUTH ON TUESDAYS AND SATURDAYS, THEN TAKE 1 TABLET (2.5 MG) BY MOUTH ON ALL OTHER DAYS. 120 tablet 1   No current facility-administered medications for this visit.     Allergies  Allergen Reactions  . Avelox [Moxifloxacin Hcl In Nacl] Swelling, Rash and Other (See Comments)    Patient became hypotensive after infusion started Because of a history of documented adverse serious drug reaction;Medi Alert bracelet  is recommended  . Other Rash and Swelling    Patient became hypotensive after infusion started Because of a history of documented adverse serious drug reaction;Medi Alert braceletis recommended  . Penicillins Anaphylaxis, Swelling and Other (See Comments)    REACTION: anaphylaxis Because of a history of documented adverse serious drug reaction;Medi Alert bracelet  is recommended Has patient had a PCN reaction causing immediate rash, facial/tongue/throat swelling, SOB or lightheadedness with hypotension: Yes Has patient had a PCN reaction causing severe rash involving mucus membranes or skin necrosis: No Has patient had a PCN reaction that required hospitalization: Unknown Has patient had a PCN reaction occurring within the last 10 years: No  REACTION: anaphylaxis Because of a history of documented adverse serious drug reaction;Medi Alert braceletis recommended Has patient had a PCN reaction causing immediate rash, facial/tongue/throat swelling, SOB or lightheadedness with hypotension: Yes Has patient had a PCN reaction  causing severe rash involving mucus membranes or skin necrosis: No Has patient had a PCN reaction that required hospitalization: Unknown Has patient had a PCN reaction occurring within the last 10 years: No    Review of Systems  Constitutional: Positive for fatigue.  HENT: Negative.   Eyes: Negative.   Respiratory: Positive for shortness of breath.   Cardiovascular: Negative for leg swelling.       Rare chest pain  Gastrointestinal: Negative.   Genitourinary: Negative.   Musculoskeletal: Positive for arthralgias and back pain.       Uses a can to ambulate  Allergic/Immunologic: Negative.   Neurological: Negative.   Hematological: Negative.   Psychiatric/Behavioral: Negative.     BP 119/76   Pulse 61   Resp 20   Ht 6' (1.829 m)   Wt 218 lb (98.9 kg)   SpO2 98% Comment: RA  BMI 29.57 kg/m  Physical Exam  Constitutional: He is oriented to person, place, and time. He appears well-developed and well-nourished. No distress.  HENT:  Head: Normocephalic and atraumatic.  Mouth/Throat: Oropharynx is clear and moist.  Eyes: Pupils are equal, round, and reactive to light. Conjunctivae and EOM are normal.  Neck: Normal range of motion. Neck supple. No JVD present. No thyromegaly present.  Cardiovascular: Normal rate and normal heart sounds.  No murmur heard. Irregular rhythm  Pacer generator right anterior chest wall with thin skin over the upper  end  Old sternotomy scar  Pulmonary/Chest: Effort normal and breath sounds normal. No respiratory distress.  Abdominal: Soft. Bowel sounds are normal. He exhibits no distension. There is no tenderness.  Musculoskeletal: Normal range of motion. He exhibits no edema.  Lymphadenopathy:    He has no cervical adenopathy.  Neurological: He is alert and oriented to person, place, and time.  Skin: Skin is warm and dry.  Psychiatric: He has a normal mood and affect.     Diagnostic Tests:            *[facility]*                         [address], [place] [address]                            [phone]  ------------------------------------------------------------------- Transthoracic Echocardiography  Patient:    [name], [name] MR #:       [id number] Study Date: 01/04/2018 Gender:     M Age:        49 Height:     182.9 cm Weight:     103.6 kg BSA:        2.32 m^2 Pt. Status: Room:   ATTENDING    Loralie Champagne, M.D.  ORDERING     Loralie Champagne, M.D.  SONOGRAPHER  Johny Chess, RDCS, CCT  PERFORMING   [facility], Outpatient  REFERRING    Billey Gosling J  cc:  ------------------------------------------------------------------- LV EF: 40%  ------------------------------------------------------------------- Indications:      CHF - 428.0.  ------------------------------------------------------------------- History:   PMH:   Chest pain.  Dyspnea.  ------------------------------------------------------------------- Study Conclusions  - Left ventricle: The cavity size was normal. Wall thickness was   increased in a pattern of mild LVH. The estimated ejection   fraction was 40%. Septal-lateral dyssynchrony. Diffuse   hypokinesis. Features are consistent with a pseudonormal left   ventricular filling pattern, with concomitant abnormal relaxation   and increased filling pressure (grade 2 diastolic dysfunction). - Aortic valve: There was no stenosis. There was trivial   regurgitation. - Aorta: Mildly dilated ascending aorta. Aortic root dimension: 39   mm (ED). Ascending aortic diameter: 40 mm (S). - Mitral valve: There was moderate regurgitation. - Left atrium: The atrium was severely dilated. - Right ventricle: The cavity size was mildly dilated. Pacer wire   or catheter noted in right ventricle. Systolic function was   mildly reduced. - Right atrium: The atrium was moderately dilated. - Tricuspid valve: Peak  RV-RA gradient (S): 29 mm Hg. - Pulmonary arteries: PA peak pressure: 32 mm Hg (S). - Inferior vena cava: The vessel was normal in size. The   respirophasic diameter changes were in the normal range (>= 50%),   consistent with normal central venous pressure.  Impressions:  - Normal LV size with mild LV hypertrophy, EF 35-40% with   septal-lateral dyssynchrony and diffuse hypokinesis. Mildly   dilated RV with mildly decreased systolic function. Biatrial   enlargement.  ------------------------------------------------------------------- Study data:  Comparison was made to the study of 07/04/2017.  Study status:  Routine.  Procedure:  Transthoracic echocardiography. Image quality was fair.  Study completion:  There were no complications.          Transthoracic echocardiography.  M-mode, complete 2D, spectral Doppler, and color Doppler.  Birthdate: Patient birthdate: 1940/09/26.  Age:  Patient is 77 yr old.  Sex: Gender: male.    BMI: 31 kg/m^2.  Blood pressure:     100/61 Patient status:  Outpatient.  Study date:  Study date: 01/04/2018. Study time: 12:16 PM.  Location:  Echo laboratory.  -------------------------------------------------------------------  ------------------------------------------------------------------- Left ventricle:  The cavity size was normal. Wall thickness was increased in a pattern of mild LVH. The estimated ejection fraction was 40%. Septal-lateral dyssynchrony. Diffuse hypokinesis. Features are consistent with a pseudonormal left ventricular filling pattern, with concomitant abnormal relaxation and increased filling pressure (grade 2 diastolic dysfunction).  ------------------------------------------------------------------- Aortic valve:   Trileaflet.  Doppler:   There was no stenosis. There was trivial regurgitation.  ------------------------------------------------------------------- Aorta:  Mildly dilated ascending  aorta.  ------------------------------------------------------------------- Mitral valve:   Normal thickness leaflets .  Doppler:   There was no evidence for stenosis.   There was moderate regurgitation.  ------------------------------------------------------------------- Left atrium:  The atrium was severely dilated.  ------------------------------------------------------------------- Right ventricle:  The cavity size was mildly dilated. Pacer wire or catheter noted in right ventricle. Systolic function was mildly reduced.  ------------------------------------------------------------------- Pulmonic valve:    Structurally normal valve.   Cusp separation was normal.  Doppler:  Transvalvular velocity was within the normal range. There was trivial regurgitation.  ------------------------------------------------------------------- Tricuspid valve:   Doppler:  There was trivial regurgitation.   ------------------------------------------------------------------- Right atrium:  The atrium was moderately dilated.  ------------------------------------------------------------------- Pericardium:  There was no pericardial effusion.  ------------------------------------------------------------------- Systemic veins: Inferior vena cava: The vessel was normal in size. The respirophasic diameter changes were in the normal range (>= 50%), consistent with normal central venous pressure.  ------------------------------------------------------------------- Post procedure conclusions Ascending Aorta:  - Mildly dilated ascending aorta.  ------------------------------------------------------------------- Measurements   Left ventricle                         Value        Reference  LV ID, ED, PLAX chordal        (H)     54    mm     43 - 52  LV ID, ES, PLAX chordal        (H)     45    mm     23 - 38  LV fx shortening, PLAX chordal (L)     17    %      >=29  LV PW thickness, ED                     15    mm     ----------  IVS/LV PW ratio, ED                    0.93         <=1.3  Stroke volume, 2D                      77    ml     ----------  Stroke volume/bsa, 2D                  33    ml/m^2 ----------    Ventricular septum  Value        Reference  IVS thickness, ED                      14    mm     ----------    LVOT                                   Value        Reference  LVOT ID, S                             23    mm     ----------  LVOT area                              4.15  cm^2   ----------  LVOT peak velocity, S                  90.5  cm/s   ----------  LVOT mean velocity, S                  60.9  cm/s   ----------  LVOT VTI, S                            18.5  cm     ----------    Aorta                                  Value        Reference  Aortic root ID, ED                     39    mm     ----------  Ascending aorta ID, A-P, S             40    mm     ----------    Left atrium                            Value        Reference  LA ID, A-P, ES                         64    mm     ----------  LA ID/bsa, A-P                 (H)     2.76  cm/m^2 <=2.2  LA volume, S                           195   ml     ----------  LA volume/bsa, S                       84    ml/m^2 ----------  LA volume, ES, 1-p A4C                 241   ml     ----------  LA volume/bsa, ES, 1-p A4C  103.9 ml/m^2 ----------  LA volume, ES, 1-p A2C                 142   ml     ----------  LA volume/bsa, ES, 1-p A2C             61.2  ml/m^2 ----------    Pulmonary arteries                     Value        Reference  PA pressure, S, DP             (H)     32    mm Hg  <=30    Tricuspid valve                        Value        Reference  Tricuspid regurg peak velocity         269   cm/s   ----------  Tricuspid peak RV-RA gradient          29    mm Hg  ----------    Right atrium                           Value        Reference  RA ID, S-I, ES, A4C             (H)     79.3  mm     34 - 49  RA area, ES, A4C               (H)     30.6  cm^2   8.3 - 19.5  RA volume, ES, A/L                     99.6  ml     ----------  RA volume/bsa, ES, A/L                 42.9  ml/m^2 ----------    Systemic veins                         Value        Reference  Estimated CVP                          3     mm Hg  ----------    Right ventricle                        Value        Reference  RV pressure, S, DP             (H)     32    mm Hg  <=30  Legend: (L)  and  (H)  mark values outside specified reference range.  ------------------------------------------------------------------- Prepared and Electronically Authenticated by  Loralie Champagne, M.D. 2019-10-17T13:07:55   Impression:  This 77 year old gentleman has ischemic cardiomyopathy with combined chronic systolic and diastolic congestive heart failure that is requiring escalating doses of medication including diuretics.  His QRS is 164 ms.  He has a biventricular pacing system in place but the left ventricular lead is not working appropriately and therefore it is felt that placing an epicardial left ventricular pacing lead is the best treatment to  try to improve his symptoms.  I discussed the operative procedure of insertion of a left ventricular epicardial pacing lead via a small left thoracotomy with revision of his biventricular pacing system with the patient and his wife.  We discussed the alternative of not performing the procedure, benefits and risk of the procedure including but not limited to bleeding, blood transfusion, infection, injury to the heart, failure to improve his ejection fraction or symptoms, pacing lead malfunction, and he understands and agrees to proceed.   Plan:  Left minithoracotomy for insertion of left ventricular epicardial pacing lead and revision of his biventricular pacing system.  I spent 45 minutes performing this consultation and > 50% of this time was spent face to  face counseling and coordinating the care of this patient's chronic combined systolic and diastolic congestive heart failure.  Gaye Pollack, MD [facility] [phone]

## 2018-01-24 ENCOUNTER — Other Ambulatory Visit: Payer: Self-pay | Admitting: *Deleted

## 2018-01-24 ENCOUNTER — Telehealth: Payer: Self-pay | Admitting: Pharmacist

## 2018-01-24 DIAGNOSIS — I429 Cardiomyopathy, unspecified: Secondary | ICD-10-CM

## 2018-01-24 NOTE — Pre-Procedure Instructions (Signed)
[name]  01/24/2018      [facility] ([facility]), [place] - [address] [place] ([place]) [name] [id number] Phone: [phone] Fax: [phone]    Your procedure is scheduled on Tues., Nov. 12, 2019 from 7:30AM-12:35PM  Report to [facility] Admitting Entrance "A" at 5:30AM  Call this number if you have problems the morning of surgery:  [phone]   Remember:  Do not eat or drink after midnight on Nov. 11th    Take these medicines the morning of surgery with A SIP OF WATER: Allopurinol (ZYLOPRIM), AmLODipine (NORVASC), Carvedilol (COREG),  Carboxymethylcellulose (REFRESH) eye drops, Digoxin (LANOXIN), DULoxetine (CYMBALTA)  Gabapentin (NEURONTIN), and Omeprazole (PRILOSEC)  If needed: TraMADol (ULTRAM), Acetaminophen (TYLENOL), Ipratropium (ATROVENT) nasal spray, and NitroGLYCERIN (NITROSTAT)  Hold Warfarin (COUMADIN) starting on 01/22/18  As of today, stop taking all Other Aspirin Products, Vitamins, Fish oils, and Herbal medications. Also stop all NSAIDS i.e. Advil, Ibuprofen, Motrin, Aleve, Anaprox, Naproxen, BC, Goody Powders, and all Supplements.   How to Manage Your Diabetes Before and After Surgery  Why is it important to control my blood sugar before and after surgery? . Improving blood sugar levels before and after surgery helps healing and can limit problems. . A way of improving blood sugar control is eating a healthy diet by: o  Eating less sugar and carbohydrates o  Increasing activity/exercise o  Talking with your doctor about reaching your blood sugar goals . High blood sugars (greater than 180 mg/dL) can raise your risk of infections and slow your recovery, so you will need to focus on controlling your diabetes during the weeks before surgery. . Make sure that the doctor who takes care of your diabetes knows about your planned surgery including the date and location.  How do I manage my blood sugar before  surgery? . Check your blood sugar at least 4 times a day, starting 2 days before surgery, to make sure that the level is not too high or low. o Check your blood sugar the morning of your surgery when you wake up and every 2 hours until you get to the Short Stay unit. . If your blood sugar is less than 70 mg/dL, you will need to treat for low blood sugar: o Do not take insulin. o Treat a low blood sugar (less than 70 mg/dL) with  cup of clear juice (cranberry or apple), 4 glucose tablets, OR glucose gel. Recheck blood sugar in 15 minutes after treatment (to make sure it is greater than 70 mg/dL). If your blood sugar is not greater than 70 mg/dL on recheck, call [phone] o  for further instructions. .  If your CBG is greater than 220 mg/dL, you may take  of your sliding scale (correction) dose of insulin.   . If you are admitted to the hospital after surgery: o Your blood sugar will be checked by the staff and you will probably be given insulin after surgery (instead of oral diabetes medicines) to make sure you have good blood sugar levels. o The goal for blood sugar control after surgery is 80-180 mg/dL.  WHAT DO I DO ABOUT MY DIABETES MEDICATION?  Marland Kitchen Do not take oral diabetes medicines (pills) the morning of surgery.  . Do not take evening dose of Novolin R the night before surgery.  . THE NIGHT BEFORE SURGERY, take _____3______ units of ____Novolin N_______insulin.  . THE MORNING OF SURGERY, take _____3________ units of _____Novolin  N_____insulin.  Reviewed and Endorsed by [facility] Patient Education Committee, August 2015    Do not wear jewelry.  Do not wear lotions, powders, colognes, or deodorant.  Do not shave 48 hours prior to surgery.  Men may shave face.  Do not bring valuables to the hospital.  [facility] is not responsible for any belongings or valuables.  Contacts, dentures or bridgework may not be worn into surgery.  Leave your suitcase in the car.  After surgery  it may be brought to your room.  For patients admitted to the hospital, discharge time will be determined by your treatment team.  Patients discharged the day of surgery will not be allowed to drive home.   Special instructions:   [place]- Preparing For Surgery  Before surgery, you can play an important role. Because skin is not sterile, your skin needs to be as free of germs as possible. You can reduce the number of germs on your skin by washing with CHG (chlorahexidine gluconate) Soap before surgery.  CHG is an antiseptic cleaner which kills germs and bonds with the skin to continue killing germs even after washing.    Oral Hygiene is also important to reduce your risk of infection.  Remember - BRUSH YOUR TEETH THE MORNING OF SURGERY WITH YOUR REGULAR TOOTHPASTE  Please do not use if you have an allergy to CHG or antibacterial soaps. If your skin becomes reddened/irritated stop using the CHG.  Do not shave (including legs and underarms) for at least 48 hours prior to first CHG shower. It is OK to shave your face.  Please follow these instructions carefully.   1. Shower the NIGHT BEFORE SURGERY and the MORNING OF SURGERY with CHG.   2. If you chose to wash your hair, wash your hair first as usual with your normal shampoo.  3. After you shampoo, rinse your hair and body thoroughly to remove the shampoo.  4. Use CHG as you would any other liquid soap. You can apply CHG directly to the skin and wash gently with a scrungie or a clean washcloth.   5. Apply the CHG Soap to your body ONLY FROM THE NECK DOWN.  Do not use on open wounds or open sores. Avoid contact with your eyes, ears, mouth and genitals (private parts). Wash Face and genitals (private parts)  with your normal soap.  6. Wash thoroughly, paying special attention to the area where your surgery will be performed.  7. Thoroughly rinse your body with warm water from the neck down.  8. DO NOT shower/wash with your normal soap  after using and rinsing off the CHG Soap.  9. Pat yourself dry with a CLEAN TOWEL.  10. Wear CLEAN PAJAMAS to bed the night before surgery, wear comfortable clothes the morning of surgery  11. Place CLEAN SHEETS on your bed the night of your first shower and DO NOT SLEEP WITH PETS.  Day of Surgery:  Do not apply any deodorants/lotions.  Please wear clean clothes to the hospital/surgery center.   Remember to brush your teeth WITH YOUR REGULAR TOOTHPASTE.   Please read over the following fact sheets that you were given. Pain Booklet, Coughing and Deep Breathing, MRSA Information and Surgical Site Infection Prevention

## 2018-01-24 NOTE — Telephone Encounter (Signed)
-----   Message from [name], MD sent at 01/24/2018  2:00 PM EST ----- Regarding: RE: Procedure next week on 01/30/18 I think ok without bridge as he did ok in the past.   ----- Message ----- From: Erskine Emery, [facility] Sent: 01/24/2018  11:12 AM EST To: Jolaine Artist, MD, [name], MD Subject: Procedure next week on 01/30/18                Dr. Haroldine Laws and Dr. Aundra Dubin,  Mr. Kelter is pending a procedure with Dr. Cyndia Bent on 01/30/18. He was told to take his last dose of Coumadin today. It appears he has not been bridged in the past. He has a CHADSVASC score is 6 (DM, HTN, CHF, CAD and ageX2) which would not warrant Lovenox bridging per our protocol, however pt also has a hx of a DVT and an LA thrombus. I just wanted to run this by you all to be sure that he does not need a lovenox bridge (Dr. Irish Lack had oked this in the past, but he now sees Dr. Aundra Dubin for management mostly). Please advise!   Thank you, Georgina Peer

## 2018-01-24 NOTE — Telephone Encounter (Signed)
Ok to hold without lovenox bridging per Dr. Aundra Dubin

## 2018-01-25 ENCOUNTER — Encounter (HOSPITAL_COMMUNITY): Payer: Self-pay

## 2018-01-25 ENCOUNTER — Encounter: Payer: Self-pay | Admitting: Hematology & Oncology

## 2018-01-25 ENCOUNTER — Inpatient Hospital Stay: Payer: Medicare Other | Attending: Hematology & Oncology

## 2018-01-25 ENCOUNTER — Other Ambulatory Visit: Payer: Self-pay

## 2018-01-25 ENCOUNTER — Encounter (HOSPITAL_COMMUNITY)
Admission: RE | Admit: 2018-01-25 | Discharge: 2018-01-25 | Disposition: A | Discharge status: Home / Self Care | Payer: Medicare Other | Source: Ambulatory Visit | Attending: Surgery | Admitting: Surgery

## 2018-01-25 ENCOUNTER — Inpatient Hospital Stay (HOSPITAL_BASED_OUTPATIENT_CLINIC_OR_DEPARTMENT_OTHER): Payer: Medicare Other | Admitting: Hematology & Oncology

## 2018-01-25 VITALS — BP 104/62 | HR 54 | Temp 97.5°F | Resp 18 | Wt 230.0 lb

## 2018-01-25 DIAGNOSIS — D45 Polycythemia vera: Secondary | ICD-10-CM | POA: Insufficient documentation

## 2018-01-25 DIAGNOSIS — Z950 Presence of cardiac pacemaker: Secondary | ICD-10-CM

## 2018-01-25 DIAGNOSIS — Z794 Long term (current) use of insulin: Secondary | ICD-10-CM

## 2018-01-25 DIAGNOSIS — Z79899 Other long term (current) drug therapy: Secondary | ICD-10-CM | POA: Diagnosis not present

## 2018-01-25 DIAGNOSIS — Z7901 Long term (current) use of anticoagulants: Secondary | ICD-10-CM

## 2018-01-25 DIAGNOSIS — I482 Chronic atrial fibrillation, unspecified: Secondary | ICD-10-CM

## 2018-01-25 DIAGNOSIS — I429 Cardiomyopathy, unspecified: Secondary | ICD-10-CM | POA: Insufficient documentation

## 2018-01-25 DIAGNOSIS — Z01818 Encounter for other preprocedural examination: Secondary | ICD-10-CM | POA: Diagnosis not present

## 2018-01-25 LAB — HEMOGLOBIN A1C
HEMOGLOBIN A1C: 6.3 % — AB (ref 4.8–5.6)
MEAN PLASMA GLUCOSE: 134.11 mg/dL

## 2018-01-25 LAB — CBC
HCT: 44.1 % (ref 39.0–52.0)
Hemoglobin: 14.4 g/dL (ref 13.0–17.0)
MCH: 29.8 pg (ref 26.0–34.0)
MCHC: 32.7 g/dL (ref 30.0–36.0)
MCV: 91.3 fL (ref 80.0–100.0)
NRBC: 0 % (ref 0.0–0.2)
PLATELETS: 225 10*3/uL (ref 150–400)
RBC: 4.83 MIL/uL (ref 4.22–5.81)
RDW: 17 % — ABNORMAL HIGH (ref 11.5–15.5)
WBC: 5.8 10*3/uL (ref 4.0–10.5)

## 2018-01-25 LAB — COMPREHENSIVE METABOLIC PANEL
ALBUMIN: 3.3 g/dL — AB (ref 3.5–5.0)
ALT: 7 U/L (ref 0–44)
AST: 16 U/L (ref 15–41)
Alkaline Phosphatase: 62 U/L (ref 38–126)
Anion gap: 12 (ref 5–15)
BUN: 27 mg/dL — AB (ref 8–23)
CHLORIDE: 108 mmol/L (ref 98–111)
CO2: 18 mmol/L — AB (ref 22–32)
Calcium: 8.9 mg/dL (ref 8.9–10.3)
Creatinine, Ser: 1.54 mg/dL — ABNORMAL HIGH (ref 0.61–1.24)
GFR calc Af Amer: 49 mL/min — ABNORMAL LOW (ref >60)
GFR calc non Af Amer: 42 mL/min — ABNORMAL LOW (ref >60)
GLUCOSE: 127 mg/dL — AB (ref 70–99)
POTASSIUM: 4.3 mmol/L (ref 3.5–5.1)
Sodium: 138 mmol/L (ref 135–145)
Total Bilirubin: 1.2 mg/dL (ref 0.3–1.2)
Total Protein: 6.2 g/dL — ABNORMAL LOW (ref 6.5–8.1)

## 2018-01-25 LAB — URINALYSIS, ROUTINE W REFLEX MICROSCOPIC
BILIRUBIN URINE: NEGATIVE
Glucose, UA: NEGATIVE mg/dL
Hgb urine dipstick: NEGATIVE
KETONES UR: NEGATIVE mg/dL
Leukocytes, UA: NEGATIVE
Nitrite: NEGATIVE
PH: 7 (ref 5.0–8.0)
Protein, ur: NEGATIVE mg/dL
Specific Gravity, Urine: 1.008 (ref 1.005–1.030)

## 2018-01-25 LAB — CBC WITH DIFFERENTIAL (CANCER CENTER ONLY)
Abs Immature Granulocytes: 0.02 10*3/uL (ref 0.00–0.07)
BASOS ABS: 0 10*3/uL (ref 0.0–0.1)
Basophils Relative: 0 %
EOS ABS: 0.1 10*3/uL (ref 0.0–0.5)
EOS PCT: 1 %
HCT: 45.3 % (ref 39.0–52.0)
Hemoglobin: 14.3 g/dL (ref 13.0–17.0)
Immature Granulocytes: 0 %
LYMPHS ABS: 0.8 10*3/uL (ref 0.7–4.0)
Lymphocytes Relative: 14 %
MCH: 29.4 pg (ref 26.0–34.0)
MCHC: 31.6 g/dL (ref 30.0–36.0)
MCV: 93 fL (ref 80.0–100.0)
Monocytes Absolute: 0.6 10*3/uL (ref 0.1–1.0)
Monocytes Relative: 9 %
NRBC: 0 % (ref 0.0–0.2)
Neutro Abs: 4.6 10*3/uL (ref 1.7–7.7)
Neutrophils Relative %: 76 %
Platelet Count: 163 10*3/uL (ref 150–400)
RBC: 4.87 MIL/uL (ref 4.22–5.81)
RDW: 16.8 % — AB (ref 11.5–15.5)
WBC: 6.1 10*3/uL (ref 4.0–10.5)

## 2018-01-25 LAB — BLOOD GAS, ARTERIAL
Acid-Base Excess: 1 mmol/L (ref 0.0–2.0)
Bicarbonate: 24.1 mmol/L (ref 20.0–28.0)
DRAWN BY: 470591
FIO2: 0.21
O2 SAT: 95 %
PATIENT TEMPERATURE: 98.6
PCO2 ART: 31.8 mmHg — AB (ref 32.0–48.0)
pH, Arterial: 7.491 — ABNORMAL HIGH (ref 7.350–7.450)
pO2, Arterial: 87.2 mmHg (ref 83.0–108.0)

## 2018-01-25 LAB — TYPE AND SCREEN
ABO/RH(D): A POS
ANTIBODY SCREEN: NEGATIVE

## 2018-01-25 LAB — RETICULOCYTES
Immature Retic Fract: 11.1 % (ref 2.3–15.9)
RBC.: 4.87 MIL/uL (ref 4.22–5.81)
Retic Count, Absolute: 73.5 10*3/uL (ref 19.0–186.0)
Retic Ct Pct: 1.5 % (ref 0.4–3.1)

## 2018-01-25 LAB — APTT: aPTT: 42 seconds — ABNORMAL HIGH (ref 24–36)

## 2018-01-25 LAB — GLUCOSE, CAPILLARY: Glucose-Capillary: 149 mg/dL — ABNORMAL HIGH (ref 70–99)

## 2018-01-25 LAB — SURGICAL PCR SCREEN
MRSA, PCR: NEGATIVE
STAPHYLOCOCCUS AUREUS: NEGATIVE

## 2018-01-25 LAB — PROTIME-INR
INR: 3.49
Prothrombin Time: 34.5 seconds — ABNORMAL HIGH (ref 11.4–15.2)

## 2018-01-25 NOTE — Progress Notes (Signed)
Hematology and Oncology Follow Up Visit  [name] [id number] [birth year] 77 y.o. 01/25/2018   Principle Diagnosis:  Polycythemia vera  Current Therapy:   Phlebotomy to maintain hematocrit below 45%. Aspirin 81 mg by mouth weekly Coumadin for chronic atrial fibrillation   Interim History:  [name] is here today with his wife for follow-up.  The big news is that he is going to have a procedure on Tuesday.  This is a special type of pacemaker insertion.  It would be an epicardial lead placement.  He will be in the hospital for a couple days.  They will try to get his chambers "coordinated" so that his heart will be more efficient and he will not have as much issues with shortness of breath and decreased lower extremity edema.  He was hospitalized recently.  I wish I would have known about this.  He had heart failure.  This is when they decided to do this epicardial lead placement.    He is eating well.  He has had no problems with fever.  He has had no bleeding.  There is been no change in bowel or bladder habits.   His iron studies back in August showed a ferritin of 41 with an iron saturation of 22%.   Overall, his performance status is ECOG 1-2.    Medications:  Allergies as of 01/25/2018      Reactions   Avelox [moxifloxacin Hcl In Nacl] Swelling, Rash, Other (See Comments)   Patient became hypotensive after infusion started Because of a history of documented adverse serious drug reaction;Medi Alert bracelet  is recommended   Other Rash, Swelling   Patient became hypotensive after infusion started Because of a history of documented adverse serious drug reaction;Medi Alert braceletis recommended   Penicillins Anaphylaxis, Swelling, Other (See Comments)   Because of a history of documented adverse serious drug reaction;Medi Alert bracelet  is recommended Has patient had a PCN reaction causing immediate rash, facial/tongue/throat swelling, SOB or lightheadedness with  hypotension: Yes Has patient had a PCN reaction causing severe rash involving mucus membranes or skin necrosis: No Has patient had a PCN reaction that required hospitalization: Unknown Has patient had a PCN reaction occurring within the last 10 years: No      Medication List        Accurate as of 01/25/18 12:29 PM. Always use your most recent med list.          ACCU-CHEK AVIVA PLUS test strip Generic drug:  glucose blood USE 1 STRIP TO CHECK GLUCOSE THREE TIMES DAILY AS DIRECTED   ACCU-CHEK FASTCLIX LANCETS Misc USE 1 LANCET TO CHECK GLUCOSE THREE TIMES DAILY   acetaminophen 325 MG tablet Commonly known as:  TYLENOL Take 650-975 mg by mouth 3 (three) times daily as needed for moderate pain.   allopurinol 100 MG tablet Commonly known as:  ZYLOPRIM TAKE TWO TABLETS BY MOUTH ONCE DAILY   amLODipine 5 MG tablet Commonly known as:  NORVASC Take 1 tablet (5 mg total) by mouth daily.   atorvastatin 10 MG tablet Commonly known as:  LIPITOR Take 1 tablet (10 mg total) by mouth daily.   carboxymethylcellulose 0.5 % Soln Commonly known as:  REFRESH PLUS Place 2 drops into both eyes daily.   carvedilol 25 MG tablet Commonly known as:  COREG TAKE 1 TABLET BY MOUTH TWICE DAILY WITH A MEAL   cholecalciferol 25 MCG (1000 UT) tablet Commonly known as:  VITAMIN D Take 1,000 Units by mouth daily.  colchicine 0.6 MG tablet Take 0.6 mg by mouth daily as needed (gout flares).   digoxin 0.125 MG tablet Commonly known as:  LANOXIN Take 0.5 tablets (0.0625 mg total) by mouth daily.   DULoxetine 30 MG capsule Commonly known as:  CYMBALTA Take 1 capsule (30 mg total) by mouth daily.   gabapentin 300 MG capsule Commonly known as:  NEURONTIN Take 2 capsules (600 mg total) by mouth 3 (three) times daily. TAKE ONE CAPSULE BY MOUTH 5 TIMES DAILY AS NEEDED FOR LEG PAIN   ipratropium 0.06 % nasal spray Commonly known as:  ATROVENT Place 2 sprays into both nostrils daily.     methocarbamol 750 MG tablet Commonly known as:  ROBAXIN Take 1 tablet (750 mg total) by mouth 2 (two) times daily as needed for muscle spasms.   metolazone 2.5 MG tablet Commonly known as:  ZAROXOLYN Take only as prescribed by CHF clinic.   metroNIDAZOLE 0.75 % gel Commonly known as:  METROGEL Apply 1 application topically daily as needed (rosacea).   nitroGLYCERIN 0.4 MG SL tablet Commonly known as:  NITROSTAT Place 1 tablet (0.4 mg total) under the tongue every 5 (five) minutes as needed for chest pain (MAX 3 TABLETS).   NOVOLIN N 100 UNIT/ML injection Generic drug:  insulin NPH Human Inject 6-8 Units into the skin See admin instructions. Inject 8 units subcutaneously in the morning and inject 6 units subcutaneously in the evening.   NOVOLIN R 100 units/mL injection Generic drug:  insulin regular Inject 6 Units into the skin daily before supper.   omeprazole 20 MG capsule Commonly known as:  PRILOSEC Take 2 capsules (40 mg total) by mouth 2 (two) times daily.   potassium chloride SA 20 MEQ tablet Commonly known as:  K-DUR,KLOR-CON Take 2 tablets (40 mEq total) by mouth daily.   sacubitril-valsartan 24-26 MG Commonly known as:  ENTRESTO Take 1 tablet by mouth 2 (two) times daily.   torsemide 100 MG tablet Commonly known as:  DEMADEX Take 1 tablet (100 mg total) by mouth 2 (two) times daily.   traMADol 50 MG tablet Commonly known as:  ULTRAM TAKE TWO TABLETS BY MOUTH EVERY 8 HOURS AS NEEDED FOR PAIN.   warfarin 2.5 MG tablet Commonly known as:  COUMADIN Take as directed by the anticoagulation clinic. If you are unsure how to take this medication, talk to your nurse or doctor. Original instructions:  TAKE 2 TABLETS (5 MG) BY MOUTH ON TUESDAYS AND SATURDAYS, THEN TAKE 1 TABLET (2.5 MG) BY MOUTH ON ALL OTHER DAYS.       Allergies:  Allergies  Allergen Reactions  . Avelox [Moxifloxacin Hcl In Nacl] Swelling, Rash and Other (See Comments)    Patient became  hypotensive after infusion started Because of a history of documented adverse serious drug reaction;Medi Alert bracelet  is recommended  . Other Rash and Swelling    Patient became hypotensive after infusion started Because of a history of documented adverse serious drug reaction;Medi Alert braceletis recommended  . Penicillins Anaphylaxis, Swelling and Other (See Comments)    Because of a history of documented adverse serious drug reaction;Medi Alert bracelet  is recommended Has patient had a PCN reaction causing immediate rash, facial/tongue/throat swelling, SOB or lightheadedness with hypotension: Yes Has patient had a PCN reaction causing severe rash involving mucus membranes or skin necrosis: No Has patient had a PCN reaction that required hospitalization: Unknown Has patient had a PCN reaction occurring within the last 10 years: No  Past Medical History, Surgical history, Social history, and Family History were reviewed and updated.  Review of Systems:  Review of Systems  Constitutional: Negative.   HENT: Negative.   Eyes: Negative.   Respiratory: Positive for shortness of breath.   Cardiovascular: Positive for palpitations.  Gastrointestinal: Positive for constipation and heartburn.  Genitourinary: Positive for frequency.  Musculoskeletal: Positive for back pain and joint pain.  Skin: Negative.   Neurological: Positive for dizziness.  Endo/Heme/Allergies: Negative.   Psychiatric/Behavioral: Negative.      Physical Exam:  weight is 230 lb (104.3 kg). His oral temperature is 97.5 F (36.4 C) (abnormal). His blood pressure is 104/62 and his pulse is 54 (abnormal). His respiration is 18 and oxygen saturation is 96%.   Wt Readings from Last 3 Encounters:  01/25/18 230 lb (104.3 kg)  01/25/18 228 lb 1.6 oz (103.5 kg)  01/23/18 218 lb (98.9 kg)    Physical Exam  Constitutional: He is oriented to person, place, and time.  HENT:  Head: Normocephalic and atraumatic.    Mouth/Throat: Oropharynx is clear and moist.  Eyes: Pupils are equal, round, and reactive to light. EOM are normal.  Neck: Normal range of motion.  Cardiovascular: Normal rate, regular rhythm and normal heart sounds.  Pulmonary/Chest: Effort normal and breath sounds normal.  Abdominal: Soft. Bowel sounds are normal.  Musculoskeletal: Normal range of motion. He exhibits no edema, tenderness or deformity.  Lymphadenopathy:    He has no cervical adenopathy.  Neurological: He is alert and oriented to person, place, and time.  Skin: Skin is warm and dry. No rash noted. No erythema.  Psychiatric: He has a normal mood and affect. His behavior is normal. Judgment and thought content normal.  Vitals reviewed.    Lab Results  Component Value Date   WBC 6.1 01/25/2018   HGB 14.3 01/25/2018   HCT 45.3 01/25/2018   MCV 93.0 01/25/2018   PLT 163 01/25/2018   Lab Results  Component Value Date   FERRITIN 41 11/09/2017   IRON 81 11/09/2017   TIBC 365 11/09/2017   UIBC 285 11/09/2017   IRONPCTSAT 22 (L) 11/09/2017   Lab Results  Component Value Date   RETICCTPCT 1.5 01/25/2018   RBC 4.87 01/25/2018   RBC 4.87 01/25/2018   RETICCTABS 75.2 04/02/2014   No results found for: KPAFRELGTCHN, LAMBDASER, KAPLAMBRATIO No results found for: IGGSERUM, IGA, IGMSERUM No results found for: Odetta Pink, SPEI   Chemistry      Component Value Date/Time   NA 138 01/25/2018 0936   NA 148 (H) 01/30/2017 1255   NA 141 03/09/2016 1320   K 4.3 01/25/2018 0936   K 4.3 01/30/2017 1255   K 4.5 03/09/2016 1320   CL 108 01/25/2018 0936   CL 101 01/30/2017 1255   CO2 18 (L) 01/25/2018 0936   CO2 28 01/30/2017 1255   CO2 25 03/09/2016 1320   BUN 27 (H) 01/25/2018 0936   BUN 43 (H) 01/30/2017 1255   BUN 28.8 (H) 03/09/2016 1320   CREATININE 1.54 (H) 01/25/2018 0936   CREATININE 1.50 (H) 11/09/2017 1309   CREATININE 2.1 (H) 01/30/2017 1255    CREATININE 1.9 (H) 03/09/2016 1320      Component Value Date/Time   CALCIUM 8.9 01/25/2018 0936   CALCIUM 9.4 01/30/2017 1255   CALCIUM 9.2 03/09/2016 1320   ALKPHOS 62 01/25/2018 0936   ALKPHOS 68 01/30/2017 1255   ALKPHOS 97 03/09/2016 1320   AST 16  01/25/2018 0936   AST 15 11/09/2017 1309   AST 13 03/09/2016 1320   ALT 7 01/25/2018 0936   ALT 12 11/09/2017 1309   ALT 19 01/30/2017 1255   ALT 10 03/09/2016 1320   BILITOT 1.2 01/25/2018 0936   BILITOT 1.0 11/09/2017 1309   BILITOT 0.88 03/09/2016 1320      Impression and Plan: Mr. Mcmannis is a very pleasant 77 yo caucasian gentleman with polycythemia. He continues to do well and has no complaints at this time.  I am very interested to see how this surgery will do for him on Tuesday.  Hopefully, it will get his heart chambers "coordinated" so that he will have less shortness of breath and less edema in his legs.  Because of this procedure, I am not going to phlebotomize him.  I do not want to cause any major shifts in his blood count before surgery that might cause any problems for him.  I will plan to see him back in another 3 months.  I want to try to get him to the holidays and through the wintertime.    Volanda Napoleon, MD 11/7/201912:29 PM

## 2018-01-25 NOTE — Pre-Procedure Instructions (Signed)
[name]  01/25/2018      [facility] ([facility] [place] [address] [place] ([place]) [place] [address] Phone: [phone] Fax: [phone]    Your procedure is scheduled on Tues., Nov. 12, 2019                Report to [facility] Admitting Entrance "A" at 5:30AM             (posted surgery time 7:30a - 12:35p)   Call this number if you have problems the morning of surgery:  [phone]   Remember:   Do not eat any foods or drink any liquids after midnight on Nov. 11th    Take these medicines the morning of surgery with A SIP OF WATER:  Allopurinol (ZYLOPRIM), AmLODipine (NORVASC), Carvedilol (COREG),  Carboxymethylcellulose (REFRESH) eye drops, Digoxin (LANOXIN), DULoxetine (CYMBALTA)  Gabapentin (NEURONTIN), and Omeprazole (PRILOSEC)  If needed: TraMADol (ULTRAM), Acetaminophen (TYLENOL), Ipratropium (ATROVENT) nasal spray, and NitroGLYCERIN (NITROSTAT)  Hold Warfarin (COUMADIN) starting on 01/22/18  As of today, stop taking all Other Aspirin Products, Vitamins, Fish oils, and Herbal medications. Also stop all NSAIDS i.e. Advil, Ibuprofen, Motrin, Aleve, Anaprox, Naproxen, BC, Goody Powders, and all Supplements.   How to Manage Your Diabetes Before and After Surgery  Why is it important to control my blood sugar before and after surgery? . Improving blood sugar levels before and after surgery helps healing and can limit problems. . A way of improving blood sugar control is eating a healthy diet by: o  Eating less sugar and carbohydrates o  Increasing activity/exercise o  Talking with your doctor about reaching your blood sugar goals . High blood sugars (greater than 180 mg/dL) can raise your risk of infections and slow your recovery, so you will need to focus on controlling your diabetes during the weeks before surgery. . Make sure that the doctor who takes care of your diabetes knows about your planned surgery  including the date and location.  How do I manage my blood sugar before surgery? . Check your blood sugar at least 4 times a day, starting 2 days before surgery, to make sure that the level is not too high or low. o Check your blood sugar the morning of your surgery when you wake up and every 2 hours until you get to the Short Stay unit. . If your blood sugar is less than 70 mg/dL, you will need to treat for low blood sugar: .  o Do not take insulin. o Treat a low blood sugar (less than 70 mg/dL) with  cup of clear juice (cranberry or apple), 4 glucose tablets, OR glucose gel.  o  NO ORANGE JUICE o  Recheck blood sugar in 15 minutes after treatment (to make sure it is greater than 70 mg/dL). If your blood sugar is not greater than 70 mg/dL on recheck, call [phone]   o  for further instructions. .  If your CBG is greater than 220 mg/dL, you may take  of your sliding scale (correction) dose of insulin.   . If you are admitted to the hospital after surgery: o Your blood sugar will be checked by the staff and you will probably be given insulin after surgery (instead of oral diabetes medicines) to make sure you have good blood sugar levels. o The goal for blood sugar control after surgery is 80-180 mg/dL.  WHAT DO I DO ABOUT MY DIABETES MEDICATION?  Marland Kitchen  Do not take oral diabetes medicines (pills) the morning of surgery.  . Do not take evening dose of Novolin R the night before surgery.  . THE NIGHT BEFORE SURGERY, take _____3______ units of ____Novolin N_______insulin.  . THE MORNING OF SURGERY, take _____3________ units of _____Novolin N_____insulin.  Reviewed and Endorsed by [facility] Patient Education Committee, August 2015    Do not wear jewelry.  Do not wear lotions,  colognes, or deodorant.                      Men may shave face & neck  Do not bring valuables to the hospital.  [facility] is not responsible for any belongings or valuables.  Contacts, dentures or  bridgework may not be worn into surgery.  Leave your suitcase in the car.  After surgery it may be brought to your room.  For patients admitted to the hospital, discharge time will be determined by your treatment team.     [facility]- Preparing For Surgery  Before surgery, you can play an important role. Because skin is not sterile, your skin needs to be as free of germs as possible. You can reduce the number of germs on your skin by washing with CHG (chlorahexidine gluconate) Soap before surgery.  CHG is an antiseptic cleaner which kills germs and bonds with the skin to continue killing germs even after washing.    Oral Hygiene is also important to reduce your risk of infection.    Remember - BRUSH YOUR TEETH THE MORNING OF SURGERY WITH YOUR REGULAR TOOTHPASTE  Please do not use if you have an allergy to CHG or antibacterial soaps. If your skin becomes reddened/irritated stop using the CHG.  Do not shave (including legs and underarms) for at least 48 hours prior to first CHG shower. It is OK to shave your face.  Please follow these instructions carefully.   1. Shower the NIGHT BEFORE SURGERY and the MORNING OF SURGERY with CHG.   2. If you chose to wash your hair, wash your hair first as usual with your normal shampoo.  3. After you shampoo, rinse your hair and body thoroughly to remove the shampoo.  4. Use CHG as you would any other liquid soap. You can apply CHG directly to the skin and wash gently with a scrungie or a clean washcloth.   5. Apply the CHG Soap to your body ONLY FROM THE NECK DOWN.  Do not use on open wounds or open sores. Avoid contact with your eyes, ears, mouth and genitals (private parts). Wash Face and genitals (private parts)  with your normal soap.  6. Wash thoroughly, paying special attention to the area where your surgery will be performed.  7. Thoroughly rinse your body with warm water from the neck down.  8. DO NOT shower/wash with your normal soap  after using and rinsing off the CHG Soap.  9. Pat yourself dry with a CLEAN TOWEL.  10. Wear CLEAN PAJAMAS to bed the night before surgery, wear comfortable clothes the morning of surgery  11. Place CLEAN SHEETS on your bed the night of your first shower and DO NOT SLEEP WITH PETS.  Day of Surgery:  Do not apply any deodorants/lotions.  Please wear clean clothes to the hospital/surgery center.   Remember to brush your teeth WITH YOUR REGULAR TOOTHPASTE.   Please read over the following fact sheets that you were given. Pain Booklet, Coughing and Deep Breathing, MRSA Information and Surgical  Site Infection Prevention

## 2018-01-25 NOTE — Progress Notes (Addendum)
ICD/Pacer form faxed to Dr. Graciela Husbands office. Email sent to Olivia Canter, [place] regarding DOS time date & arrival time PCP Dr. Billey Gosling    LOV   07/2017 Endocrinologist Dr. Eduard Clos  LOV 09/2017 Oncologist  Dr. Pearletha Alfred LOV 10/2017 for polycythemia vera Cardio Dr. Einar Crow  LOV Last A1C 6.7 back in 09/2017 He states he checks his blood sugars twice a day (am & pm).  Ranges from 115-180 Coumadin last dose 11/6 per Dr. Vivi Martens instructions. Patient does have back stimulator ([employer]) for chronic pain management. Patient does wear bil hearing aids.  Hearing per wife is not good and you need to make sure he understands what was said.

## 2018-01-26 ENCOUNTER — Encounter (HOSPITAL_COMMUNITY): Payer: Self-pay

## 2018-01-26 LAB — FERRITIN: Ferritin: 43 ng/mL (ref 24–336)

## 2018-01-26 LAB — IRON AND TIBC
Iron: 76 ug/dL (ref 42–163)
Saturation Ratios: 21 % (ref 20–55)
TIBC: 364 ug/dL (ref 202–409)
UIBC: 288 ug/dL (ref 117–376)

## 2018-01-26 NOTE — Anesthesia Preprocedure Evaluation (Addendum)
Anesthesia Evaluation  Patient identified by MRN, date of birth, ID band Patient awake    Reviewed: Allergy & Precautions, NPO status , Patient's Chart, lab work & pertinent test results, reviewed documented beta blocker date and time   Airway Mallampati: II  TM Distance: >3 FB     Dental  (+) Dental Advisory Given   Pulmonary shortness of breath, COPD, former smoker,    breath sounds clear to auscultation       Cardiovascular hypertension, Pt. on medications and Pt. on home beta blockers + CAD, + Past MI, + CABG, + Peripheral Vascular Disease and +CHF  + pacemaker + Cardiac Defibrillator  Rhythm:Regular Rate:Normal     Neuro/Psych  Neuromuscular disease    GI/Hepatic negative GI ROS, Neg liver ROS,   Endo/Other  diabetes, Type 2  Renal/GU Renal InsufficiencyRenal disease     Musculoskeletal  (+) Arthritis ,   Abdominal   Peds  Hematology negative hematology ROS (+)   Anesthesia Other Findings   Reproductive/Obstetrics                            Lab Results  Component Value Date   WBC 6.1 01/25/2018   HGB 14.3 01/25/2018   HCT 45.3 01/25/2018   MCV 93.0 01/25/2018   PLT 163 01/25/2018   Lab Results  Component Value Date   CREATININE 1.54 (H) 01/25/2018   BUN 27 (H) 01/25/2018   NA 138 01/25/2018   K 4.3 01/25/2018   CL 108 01/25/2018   CO2 18 (L) 01/25/2018    Anesthesia Physical Anesthesia Plan  ASA: IV  Anesthesia Plan: General   Post-op Pain Management:    Induction: Intravenous  PONV Risk Score and Plan: 2 and Dexamethasone, Ondansetron and Treatment may vary due to age or medical condition  Airway Management Planned: Double Lumen EBT  Additional Equipment: Arterial line  Intra-op Plan:   Post-operative Plan: Extubation in OR  Informed Consent: I have reviewed the patients History and Physical, chart, labs and discussed the procedure including the risks,  benefits and alternatives for the proposed anesthesia with the patient or authorized representative who has indicated his/her understanding and acceptance.     Plan Discussed with:   Anesthesia Plan Comments: ( )       Anesthesia Quick Evaluation

## 2018-01-26 NOTE — Progress Notes (Signed)
Anesthesia Chart Review:  Case:  [id number] Date/Time:  01/30/18 1323   Procedures:      THORACOTOMY MAJOR (Left Chest)     LV EPICARDIAL PACING LEAD PLACEMENT AND REVISION OF BiV PACING SYSTEM (N/A Chest)     TRANSESOPHAGEAL ECHOCARDIOGRAM (TEE) (N/A )   Anesthesia type:  General   Pre-op diagnosis:  CARDIOMYOPATHY   Location:  [facility] OR ROOM 17 / [place] OR   Surgeon:  Gaye Pollack, MD      DISCUSSION: Patient is a 77 year old male scheduled for the above procedure.   History includes former smoker (quit '97), HTN, HLD, CAD/MI (AV CX stent 08/08/02; CABG: LIMA-LAD, SVG-OM, SVG-PDA 09/05/02), persistent afib/flutter (remoted history of LA thrombus, history of multiple DCCV), WCT, chronotropic incompetence (s/p bi-V ICD 04/11/07, s/p exchange new BiV ICD with defibrillation threshold testing 08/23/10, s/p exchange new BiV ICD with pocket revision 02/11/13, [employer] Viva XT CRT-D), ischemic cardiomyopathy, chronic systolic CHF (s/p CardioMems placement 10/19/16), polycythemia vera (therapy: phlebotomy to maintain HCT < 45%, ASA weekly; on warfarin for afib), AAA (s/p open repair AFBG 12/03/02), ascending TAA (5.2 cm 07/24/17 CT), DM2, CKD stage III, spinal cord stimulator [employer]"), hard of hearing (bilatearl hearing aids).  Accordingt to Dr. Claris Gladden 01/04/18 note, "Patient is RV pacing about 35% of the time.  When he is not RV pacing, his native QRS is a LBBB-like IVCD at 164 msec.  He has an LV lead, but it has been turned off due to high threshold.  I turned the LV lead on at last appointment with enough output to allow pacing.  LV pacing did not appear to narrow the QRS.  Due to high threshold, leaving the LV lead on would deplete the battery fast.  I discussed the situation with Dr. Lovena Le.  He does not think that a percutaneous revision of the LV lead would be helpful.  Surgical placement of epicardial lead would be the only option.  I discussed this with [name] today.  LV pacing has the  potential to improve his symptoms.  I will refer him to Dr. Cyndia Bent for evaluation for epicardial LV lead."  PT/PTT elevated. Last warfarin 01/24/18, so repeat coags on the day of surgery. PAT routed INR results to [place]. If repeat labs acceptable and no acute changes then I would anticipate that he can proceed as planned. PAT RN notified [employer].   VS: BP 103/62   Pulse (!) 50   Temp 36.4 C   Resp 20   Ht 6' (1.829 m)   Wt 103.5 kg   SpO2 97%   BMI 30.94 kg/m    PROVIDERS: [name], MD is PCP Burney Gauze, MD is hematologist. Last visit 01/25/18. He is aware of surgery plans, so deferred phlebotomy for HCT 45.3. Cristopher Peru, MD is EP cardiologist. Last 08/29/17. Loralie Champagne, MD is HF cardiologist. Last visit 01/04/18. Elayne Snare, MD is endocrinologist   LABS: Labs reviewed: Repeat PT/PTT on the day of surgery. Renal function appears stable.  (all labs ordered are listed, but only abnormal results are displayed)  Labs Reviewed  GLUCOSE, CAPILLARY - Abnormal; Notable for the following components:      Result Value   Glucose-Capillary 149 (*)    All other components within normal limits  HEMOGLOBIN A1C - Abnormal; Notable for the following components:   Hgb A1c MFr Bld 6.3 (*)    All other components within normal limits  APTT - Abnormal; Notable for the following  components:   aPTT 42 (*)    All other components within normal limits  BLOOD GAS, ARTERIAL - Abnormal; Notable for the following components:   pH, Arterial 7.491 (*)    pCO2 arterial 31.8 (*)    All other components within normal limits  CBC - Abnormal; Notable for the following components:   RDW 17.0 (*)    All other components within normal limits  COMPREHENSIVE METABOLIC PANEL - Abnormal; Notable for the following components:   CO2 18 (*)    Glucose, Bld 127 (*)    BUN 27 (*)    Creatinine, Ser 1.54 (*)    Total Protein 6.2 (*)    Albumin 3.3 (*)    GFR calc non Af Amer 42 (*)     GFR calc Af Amer 49 (*)    All other components within normal limits  PROTIME-INR - Abnormal; Notable for the following components:   Prothrombin Time 34.5 (*)    All other components within normal limits  SURGICAL PCR SCREEN  URINALYSIS, ROUTINE W REFLEX MICROSCOPIC  TYPE AND SCREEN   Lab Results  Component Value Date   CREATININE 1.54 (H) 01/25/2018   CREATININE 1.48 (H) 01/10/2018   CREATININE 1.77 (H) 01/04/2018     IMAGES: CT Chest 07/24/17: IMPRESSION: 1. Ascending thoracic aortic 5.2 cm aneurysm, minimally increased. Ascending thoracic aortic aneurysm. Recommend semi-annual imaging followup by CTA or MRA and referral to cardiothoracic surgery if not already obtained. This recommendation follows 2010 ACCF/AHA/AATS/ACR/ASA/SCA/SCAI/SIR/STS/SVM Guidelines for the Diagnosis and Management of Patients With Thoracic Aortic Disease. Circulation. 2010; 121: Q034-V425. 2. Bilateral calcified pleural plaques are stable. Small dependent bilateral pleural effusions, right greater than left, slightly increased. 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 3. No appreciable change in nonspecific dependent basilar predominant patchy subpleural reticulation and ground-glass attenuation in the lungs, which could indicate mild asbestosis. 4. Stable cardiomegaly and prominently dilated main pulmonary artery, suggesting chronic pulmonary arterial hypertension. 5. Stable rounded atelectasis in the left lower lobe. 6. Finely irregular liver surface, cannot exclude cirrhosis. Increased moderate symmetric gynecomastia. 7. Cholelithiasis. Aortic Atherosclerosis (ICD10-I70.0) and Emphysema (ICD10-J43.9).   EKG: 01/25/18: Afib at 55 bpm. Ventricular pacing. LAD. Non-specific intra-ventricular conduction block. Possible lateral infarct, age undetermined. Inferior infarct, age undetermined.   CV: Echo 01/04/18: Study Conclusions - Left ventricle: The cavity size was normal.  Wall thickness was   increased in a pattern of mild LVH. The estimated ejection   fraction was 40%. Septal-lateral dyssynchrony. Diffuse   hypokinesis. Features are consistent with a pseudonormal left   ventricular filling pattern, with concomitant abnormal relaxation   and increased filling pressure (grade 2 diastolic dysfunction). - Aortic valve: There was no stenosis. There was trivial   regurgitation. - Aorta: Mildly dilated ascending aorta. Aortic root dimension: 39   mm (ED). Ascending aortic diameter: 40 mm (S). - Mitral valve: There was moderate regurgitation. - Left atrium: The atrium was severely dilated. - Right ventricle: The cavity size was mildly dilated. Pacer wire   or catheter noted in right ventricle. Systolic function was   mildly reduced. - Right atrium: The atrium was moderately dilated. - Tricuspid valve: Peak RV-RA gradient (S): 29 mm Hg. - Pulmonary arteries: PA peak pressure: 32 mm Hg (S). - Inferior vena cava: The vessel was normal in size. The   respirophasic diameter changes were in the normal range (>= 50%),   consistent with normal central venous pressure. Impressions: - Normal LV size with mild LV hypertrophy, EF  35-40% with   septal-lateral dyssynchrony and diffuse hypokinesis. Mildly   dilated RV with mildly decreased systolic function. Biatrial   enlargement.  [place] 09/28/17: Hemodynamics (mmHg) RA mean 9 RV 47/9 PA 43/21, mean 30 PCWP mean 19 Oxygen saturations: PA 63% AO 98% Cardiac Output (Fick) 4.83  Cardiac Index (Fick) 2.15 PVR 2.3 WU Conclusion: 1. Mildly elevated right and left heart filling pressures.  2. Pulmonary venous hypertension.  The Cardiomems matches closely to the invasive PADP measurement (assessed simultaneously during the case).  Will allow a slightly higher Cardiomems reading prior to increasing diuretics.   RHC/LHC 03/18/16: Conclusions: 1. Severe native coronary artery disease, including diffuse proximal LAD  stenosis up to 70% and mid LAD occlusion, 70% mid LCx disease and occlusion of OM2 and OM3, and diffusely diseased mid RCA with chronic total occlusion of the distal RCA. 2. Patent LIMA to LAD supplying small to medium-caliber distal LAD with 60% ostial LIMA stenosis. 3. Patent SVG to distal OM. Haziness in the proximal segment of the vein graft most likely represents a valve in the similar in appearance to the catheterization from 2008. 4. Patent SVG to PDA with two valves that appear similar to 2008. 5. Mildly elevated left and right heart filling pressures. 6. Mild pulmonary hypertension. 7. Reduced Fick cardiac output/index. Recommendations: 1. Recommend avoidance of further IV hydration today and begin gentle diuresis tomorrow based on renal function. 2. If patient continues to have dyspnea despite diuresis and optimization of heart failure regimen, he may benefit from FFR/intervention to ostial LIMA. 3. With the exception of LIMA engagement/intervention, would recommend avoidance of left radial approach due to subclavian artery tortuosity and calcification for future cases. 4. Aggressive secondary prevention  Carotid U/S 03/06/12: Impressions: Mild, mixed plaque with shadowing, bilaterally. 0-39% bilateral ICA stenoses. Patent vertebral arteries with antegrade flow.   Past Medical History:  Diagnosis Date  . AAA (abdominal aortic aneurysm) ([place])    Surgical repair 11/2002.  . Adenomatous colon polyp   . Alcohol ingestion of more than four drinks per week    Excess beer  not a dependency problem  . Aortic valve sclerosis   . Arthritis   . Atrial fibrillation ([place])    Previous long-term amiodarone therapy with multiple cardioversions / amiodarone stopped September, 2009  . Atrial flutter [facility])    Started November, 2010, Left-sided and cannot ablate  . Bony abnormality    Patient's manubrium is slightly displaced to the right  . CAD (coronary artery disease)    a. s/p CABG 2004;   b. 02/2016 Cath: LM nl, LAD 70p, 129m, D1 50, D2 50, RI 50ost, LCX 29m, OM2 100, OM3 100, RCA 70p, 100d, VG->OM3 ok, LIMA->LAD 60ost, VG->RPDA  ok.  . 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 01/2013 MDT DTBA 1D1 [place] CRT-D, ser # [id number].  . Carotid artery disease ([place])    Doppler, December, 2013, 0-39% bilateral  . Chronic systolic CHF (congestive heart failure) ([place])    a. 02/2016 Echo: EF 35-40%, diff HK, triv AI, mildly dil Ao root 71mm, mild [name], sev dil LA, triv TR.  Marland Kitchen Chronotropic incompetence    IV pacing rate adjusted  . CKD (chronic kidney disease), stage III ([place])   . COPD (chronic obstructive pulmonary disease) ([place])   . Dilated aortic root ([place])   . Discolored skin   . Diverticulosis   . Drug therapy    Redness and swelling with Avelox infusion May 24, 2011  .  Eye abnormality    Ophthalmologist questions a clot in one of his eyes, May, 2012  . Gout   . Hyperlipidemia   . Hypertension   . Internal hemorrhoids   . Ischemic cardiomyopathy   . Left atrial thrombus    Remote past... cardioversions done since that time  . Mitral regurgitation    Mild echo  . Myocardial infarction ([place])   . Nasal drainage    Chronic  . Overweight(278.02)   . Pericardial effusion   . Pleural effusion    Large loculated effusion on the left side November, 2011. This was tapped. It was exudative. Cytology revealed no cancer no proof of mesothelioma area pulmonary team felt that no further workup was needed  . Pleural thickening   . Pneumonia   . Polycythemia vera ([place]) 07/28/2014  . SOB (shortness of breath)    Large left effusion/ thoracentesis/hospitalization/November, 2011... exudated.. cytology negative.. Dr.Wert.. no proof of mesothelioma  . Spinal stenosis    Surgery Dr.Elsner  . Thrombophlebitis of superficial veins of upper extremities    Possible venous stenosis from defibrillator  . Type II diabetes mellitus ([place]) 2008  . Venous  insufficiency    Toe discoloration chronic  . Ventral hernia    April, 2014, result of his abdominal surgery  . Warfarin anticoagulation   . Wide-complex tachycardia ([place])     Past Surgical History:  Procedure Laterality Date  . ABDOMINAL AORTIC ANEURYSM REPAIR  11/2002  . ANTERIOR CERVICAL DECOMP/DISCECTOMY FUSION  1995  . BACK SURGERY    . BI-VENTRICULAR PACEMAKER INSERTION (CRT-P)  02-11-2013   Pt with previously implanted MDT CRTD downgraded to CRTP by Dr Lovena Le 02-11-13  . CARDIAC CATHETERIZATION  "several"  . CARDIAC CATHETERIZATION N/A 03/18/2016   Procedure: RIGHT/LEFT HEART CATH AND CORONARY/GRAFT ANGIOGRAPHY;  Surgeon: Nelva Bush, MD;  Location: [place] CV LAB;  Service: Cardiovascular;  Laterality: N/A;  . CATARACT EXTRACTION W/ INTRAOCULAR LENS  IMPLANT, BILATERAL    . COLONOSCOPY W/ POLYPECTOMY    . CORONARY ANGIOPLASTY WITH STENT PLACEMENT  2004  . CORONARY ARTERY BYPASS GRAFT  2004   CABG X4  . FRACTURE SURGERY    . IMPLANTABLE CARDIOVERTER DEFIBRILLATOR (ICD) GENERATOR CHANGE N/A 02/11/2013   Procedure: ICD GENERATOR CHANGE;  Surgeon: Evans Lance, MD;  Location[facility] C[facility] LAB;  Service: Cardiovascular;  Laterality: N/A;  . INCISION AND DRAINAGE ABSCESS / HEMATOMA OF BURSA / KNEE / THIGH    . INSERT / REPLACE / REMOVE PACEMAKER  2009   original pacer/defibrillator; Dr. Lovena Le 2009... by the pacing  . LUMBAR FUSION  2010  . ORIF TIBIA & FIBULA FRACTURES Right 2000s  . PILONIDAL CYST EXCISION    . RIGHT HEART CATH N/A 10/19/2016   Procedure: Right Heart Cath;  Surgeon: [name], MD;  Location: [place] CV LAB;  Service: Cardiovascular;  Laterality: N/A;  . RIGHT HEART CATH N/A 09/28/2017   Procedure: RIGHT HEART CATH;  Surgeon: [name], MD;  Location: [place] CV LAB;  Service: Cardiovascular;  Laterality: N/A;  . SPINAL CORD STIMULATOR IMPLANT  12/2011  . SURGERY SCROTAL / TESTICULAR      MEDICATIONS: . ACCU-CHEK AVIVA PLUS test  strip  . ACCU-CHEK FASTCLIX LANCETS MISC  . acetaminophen (TYLENOL) 325 MG tablet  . allopurinol (ZYLOPRIM) 100 MG tablet  . amLODipine (NORVASC) 5 MG tablet  . atorvastatin (LIPITOR) 10 MG tablet  . carboxymethylcellulose (REFRESH PLUS) 0.5 % SOLN  . carvedilol (COREG) 25 MG tablet  .  cholecalciferol (VITAMIN D) 1000 units tablet  . colchicine 0.6 MG tablet  . digoxin (LANOXIN) 0.125 MG tablet  . DULoxetine (CYMBALTA) 30 MG capsule  . gabapentin (NEURONTIN) 300 MG capsule  . insulin NPH Human (NOVOLIN N) 100 UNIT/ML injection  . insulin regular (NOVOLIN R) 100 units/mL injection  . ipratropium (ATROVENT) 0.06 % nasal spray  . methocarbamol (ROBAXIN) 750 MG tablet  . metolazone (ZAROXOLYN) 2.5 MG tablet  . metroNIDAZOLE (METROGEL) 0.75 % gel  . nitroGLYCERIN (NITROSTAT) 0.4 MG SL tablet  . omeprazole (PRILOSEC) 20 MG capsule  . potassium chloride SA (K-DUR,KLOR-CON) 20 MEQ tablet  . sacubitril-valsartan (ENTRESTO) 24-26 MG  . torsemide (DEMADEX) 100 MG tablet  . traMADol (ULTRAM) 50 MG tablet  . warfarin (COUMADIN) 2.5 MG tablet   No current facility-administered medications for this encounter.     George Hugh [facility] Short Stay Center/Anesthesiology Phone [phone] 01/26/2018 11:27 AM

## 2018-01-29 ENCOUNTER — Other Ambulatory Visit (HOSPITAL_COMMUNITY): Payer: Medicare Other

## 2018-01-29 MED ORDER — VANCOMYCIN HCL 10 G IV SOLR
1500.0000 mg | INTRAVENOUS | Status: DC
  Filled 2018-01-29: qty 1500

## 2018-01-29 MED ORDER — VANCOMYCIN HCL 10 G IV SOLR
1500.0000 mg | INTRAVENOUS | Status: AC
  Administered 2018-01-30: 1500 mg via INTRAVENOUS
  Filled 2018-01-29: qty 1500

## 2018-01-30 ENCOUNTER — Encounter (HOSPITAL_COMMUNITY): Payer: Self-pay | Admitting: *Deleted

## 2018-01-30 ENCOUNTER — Inpatient Hospital Stay (HOSPITAL_COMMUNITY): Admission: RE | Admit: 2018-01-30 | Payer: Medicare Other | Source: Ambulatory Visit | Admitting: Surgery

## 2018-01-30 ENCOUNTER — Other Ambulatory Visit: Payer: Self-pay

## 2018-01-30 ENCOUNTER — Inpatient Hospital Stay (HOSPITAL_COMMUNITY): Payer: Medicare Other | Admitting: Vascular Surgery

## 2018-01-30 ENCOUNTER — Inpatient Hospital Stay (HOSPITAL_COMMUNITY): Payer: Medicare Other

## 2018-01-30 ENCOUNTER — Inpatient Hospital Stay (HOSPITAL_COMMUNITY)
Admission: RE | Admit: 2018-01-30 | Discharge: 2018-02-02 | DRG: 271 | Disposition: A | Discharge status: Home / Self Care | Payer: Medicare Other | Source: Ambulatory Visit | Attending: Surgery | Admitting: Surgery

## 2018-01-30 ENCOUNTER — Encounter (HOSPITAL_COMMUNITY)
Admission: RE | Disposition: A | Discharge status: Home / Self Care | Payer: Self-pay | Source: Ambulatory Visit | Attending: Surgery

## 2018-01-30 DIAGNOSIS — M1990 Unspecified osteoarthritis, unspecified site: Secondary | ICD-10-CM | POA: Diagnosis present

## 2018-01-30 DIAGNOSIS — I252 Old myocardial infarction: Secondary | ICD-10-CM

## 2018-01-30 DIAGNOSIS — I447 Left bundle-branch block, unspecified: Secondary | ICD-10-CM | POA: Diagnosis present

## 2018-01-30 DIAGNOSIS — Z538 Procedure and treatment not carried out for other reasons: Secondary | ICD-10-CM | POA: Diagnosis not present

## 2018-01-30 DIAGNOSIS — Z881 Allergy status to other antibiotic agents status: Secondary | ICD-10-CM

## 2018-01-30 DIAGNOSIS — Z9841 Cataract extraction status, right eye: Secondary | ICD-10-CM | POA: Diagnosis not present

## 2018-01-30 DIAGNOSIS — I310 Chronic adhesive pericarditis: Secondary | ICD-10-CM | POA: Diagnosis not present

## 2018-01-30 DIAGNOSIS — T82118A Breakdown (mechanical) of other cardiac electronic device, initial encounter: Secondary | ICD-10-CM | POA: Diagnosis not present

## 2018-01-30 DIAGNOSIS — I482 Chronic atrial fibrillation, unspecified: Secondary | ICD-10-CM | POA: Diagnosis present

## 2018-01-30 DIAGNOSIS — I5040 Unspecified combined systolic (congestive) and diastolic (congestive) heart failure: Secondary | ICD-10-CM | POA: Diagnosis not present

## 2018-01-30 DIAGNOSIS — J449 Chronic obstructive pulmonary disease, unspecified: Secondary | ICD-10-CM | POA: Diagnosis not present

## 2018-01-30 DIAGNOSIS — J9811 Atelectasis: Secondary | ICD-10-CM | POA: Diagnosis not present

## 2018-01-30 DIAGNOSIS — M109 Gout, unspecified: Secondary | ICD-10-CM | POA: Diagnosis not present

## 2018-01-30 DIAGNOSIS — I5042 Chronic combined systolic (congestive) and diastolic (congestive) heart failure: Secondary | ICD-10-CM | POA: Diagnosis present

## 2018-01-30 DIAGNOSIS — Z833 Family history of diabetes mellitus: Secondary | ICD-10-CM

## 2018-01-30 DIAGNOSIS — Z961 Presence of intraocular lens: Secondary | ICD-10-CM | POA: Diagnosis not present

## 2018-01-30 DIAGNOSIS — Z7901 Long term (current) use of anticoagulants: Secondary | ICD-10-CM

## 2018-01-30 DIAGNOSIS — Z880 Allergy status to penicillin: Secondary | ICD-10-CM

## 2018-01-30 DIAGNOSIS — I2510 Atherosclerotic heart disease of native coronary artery without angina pectoris: Secondary | ICD-10-CM | POA: Diagnosis not present

## 2018-01-30 DIAGNOSIS — Z951 Presence of aortocoronary bypass graft: Secondary | ICD-10-CM

## 2018-01-30 DIAGNOSIS — Z9842 Cataract extraction status, left eye: Secondary | ICD-10-CM | POA: Diagnosis not present

## 2018-01-30 DIAGNOSIS — I083 Combined rheumatic disorders of mitral, aortic and tricuspid valves: Secondary | ICD-10-CM | POA: Diagnosis not present

## 2018-01-30 DIAGNOSIS — E785 Hyperlipidemia, unspecified: Secondary | ICD-10-CM | POA: Diagnosis present

## 2018-01-30 DIAGNOSIS — I255 Ischemic cardiomyopathy: Secondary | ICD-10-CM | POA: Diagnosis present

## 2018-01-30 DIAGNOSIS — Z86010 Personal history of colonic polyps: Secondary | ICD-10-CM

## 2018-01-30 DIAGNOSIS — J811 Chronic pulmonary edema: Secondary | ICD-10-CM | POA: Diagnosis not present

## 2018-01-30 DIAGNOSIS — Z9889 Other specified postprocedural states: Secondary | ICD-10-CM

## 2018-01-30 DIAGNOSIS — I130 Hypertensive heart and chronic kidney disease with heart failure and stage 1 through stage 4 chronic kidney disease, or unspecified chronic kidney disease: Secondary | ICD-10-CM | POA: Diagnosis present

## 2018-01-30 DIAGNOSIS — Z823 Family history of stroke: Secondary | ICD-10-CM

## 2018-01-30 DIAGNOSIS — N183 Chronic kidney disease, stage 3 (moderate): Secondary | ICD-10-CM | POA: Diagnosis not present

## 2018-01-30 DIAGNOSIS — Z4682 Encounter for fitting and adjustment of non-vascular catheter: Secondary | ICD-10-CM | POA: Diagnosis not present

## 2018-01-30 DIAGNOSIS — Z95810 Presence of automatic (implantable) cardiac defibrillator: Secondary | ICD-10-CM

## 2018-01-30 DIAGNOSIS — Z981 Arthrodesis status: Secondary | ICD-10-CM | POA: Diagnosis not present

## 2018-01-30 DIAGNOSIS — J90 Pleural effusion, not elsewhere classified: Secondary | ICD-10-CM | POA: Diagnosis not present

## 2018-01-30 DIAGNOSIS — I5022 Chronic systolic (congestive) heart failure: Secondary | ICD-10-CM | POA: Diagnosis not present

## 2018-01-30 DIAGNOSIS — Z794 Long term (current) use of insulin: Secondary | ICD-10-CM

## 2018-01-30 DIAGNOSIS — I429 Cardiomyopathy, unspecified: Secondary | ICD-10-CM

## 2018-01-30 DIAGNOSIS — E1122 Type 2 diabetes mellitus with diabetic chronic kidney disease: Secondary | ICD-10-CM | POA: Diagnosis not present

## 2018-01-30 HISTORY — PX: THORACOTOMY: SHX5074

## 2018-01-30 HISTORY — PX: EPICARDIAL PACING LEAD PLACEMENT: SHX6274

## 2018-01-30 LAB — PROTIME-INR
INR: 1.32
PROTHROMBIN TIME: 16.2 s — AB (ref 11.4–15.2)

## 2018-01-30 LAB — GLUCOSE, CAPILLARY
GLUCOSE-CAPILLARY: 94 mg/dL (ref 70–99)
GLUCOSE-CAPILLARY: 206 mg/dL — AB (ref 70–99)
Glucose-Capillary: 128 mg/dL — ABNORMAL HIGH (ref 70–99)

## 2018-01-30 LAB — APTT: aPTT: 29 seconds (ref 24–36)

## 2018-01-30 SURGERY — THORACOTOMY, MAJOR
Anesthesia: General | Site: Chest

## 2018-01-30 MED ORDER — ROCURONIUM BROMIDE 50 MG/5ML IV SOSY
PREFILLED_SYRINGE | INTRAVENOUS | Status: DC | PRN
  Administered 2018-01-30: 30 mg via INTRAVENOUS
  Administered 2018-01-30: 20 mg via INTRAVENOUS
  Administered 2018-01-30: 50 mg via INTRAVENOUS

## 2018-01-30 MED ORDER — DIPHENHYDRAMINE HCL 50 MG/ML IJ SOLN: 12.5000 mg | Freq: Four times a day (QID) | INTRAMUSCULAR | Status: DC | PRN

## 2018-01-30 MED ORDER — ORAL CARE MOUTH RINSE: 15.0000 mL | Freq: Two times a day (BID) | OROMUCOSAL | Status: DC

## 2018-01-30 MED ORDER — TORSEMIDE 20 MG PO TABS
100.0000 mg | ORAL_TABLET | Freq: Two times a day (BID) | ORAL | Status: DC
  Administered 2018-01-30 – 2018-02-02 (×6): 100 mg via ORAL
  Filled 2018-01-30 (×6): qty 5

## 2018-01-30 MED ORDER — METHOCARBAMOL 500 MG PO TABS: 750.0000 mg | ORAL_TABLET | Freq: Two times a day (BID) | ORAL | Status: DC | PRN

## 2018-01-30 MED ORDER — 0.9 % SODIUM CHLORIDE (POUR BTL) OPTIME
TOPICAL | Status: DC | PRN
  Administered 2018-01-30: 2000 mL

## 2018-01-30 MED ORDER — GABAPENTIN 300 MG PO CAPS: 600.0000 mg | ORAL_CAPSULE | Freq: Three times a day (TID) | ORAL | Status: DC

## 2018-01-30 MED ORDER — PHENYLEPHRINE 40 MCG/ML (10ML) SYRINGE FOR IV PUSH (FOR BLOOD PRESSURE SUPPORT)
PREFILLED_SYRINGE | INTRAVENOUS | Status: AC
  Filled 2018-01-30: qty 10

## 2018-01-30 MED ORDER — OXYCODONE HCL 5 MG PO TABS
5.0000 mg | ORAL_TABLET | ORAL | Status: DC | PRN
  Administered 2018-01-30 (×2): 5 mg via ORAL
  Administered 2018-01-31 – 2018-02-02 (×3): 10 mg via ORAL
  Filled 2018-01-30: qty 1
  Filled 2018-01-30 (×3): qty 2

## 2018-01-30 MED ORDER — LIDOCAINE 2% (20 MG/ML) 5 ML SYRINGE
INTRAMUSCULAR | Status: DC | PRN
  Administered 2018-01-30: 100 mg via INTRAVENOUS

## 2018-01-30 MED ORDER — ONDANSETRON HCL 4 MG/2ML IJ SOLN
INTRAMUSCULAR | Status: AC
  Filled 2018-01-30: qty 2

## 2018-01-30 MED ORDER — GABAPENTIN 300 MG PO CAPS
600.0000 mg | ORAL_CAPSULE | Freq: Every morning | ORAL | Status: DC
  Administered 2018-01-31 – 2018-02-02 (×3): 600 mg via ORAL
  Filled 2018-01-30 (×3): qty 2

## 2018-01-30 MED ORDER — ROCURONIUM BROMIDE 50 MG/5ML IV SOSY
PREFILLED_SYRINGE | INTRAVENOUS | Status: AC
  Filled 2018-01-30: qty 10

## 2018-01-30 MED ORDER — PROPOFOL 10 MG/ML IV BOLUS
INTRAVENOUS | Status: DC | PRN
  Administered 2018-01-30: 60 mg via INTRAVENOUS

## 2018-01-30 MED ORDER — CARVEDILOL 25 MG PO TABS
25.0000 mg | ORAL_TABLET | Freq: Two times a day (BID) | ORAL | Status: DC
  Administered 2018-01-31 – 2018-02-02 (×5): 25 mg via ORAL
  Filled 2018-01-30 (×5): qty 1

## 2018-01-30 MED ORDER — ONDANSETRON HCL 4 MG/2ML IJ SOLN
INTRAMUSCULAR | Status: DC | PRN
  Administered 2018-01-30: 4 mg via INTRAVENOUS

## 2018-01-30 MED ORDER — SUGAMMADEX SODIUM 200 MG/2ML IV SOLN
INTRAVENOUS | Status: DC | PRN
  Administered 2018-01-30: 200 mg via INTRAVENOUS

## 2018-01-30 MED ORDER — HYDROMORPHONE HCL 1 MG/ML IJ SOLN
INTRAMUSCULAR | Status: AC
  Filled 2018-01-30: qty 1

## 2018-01-30 MED ORDER — ALBUTEROL SULFATE (2.5 MG/3ML) 0.083% IN NEBU
2.5000 mg | INHALATION_SOLUTION | RESPIRATORY_TRACT | Status: DC
  Administered 2018-01-31: 2.5 mg via RESPIRATORY_TRACT
  Filled 2018-01-30: qty 3

## 2018-01-30 MED ORDER — SENNOSIDES-DOCUSATE SODIUM 8.6-50 MG PO TABS
1.0000 | ORAL_TABLET | Freq: Every day | ORAL | Status: DC
  Filled 2018-01-30 (×2): qty 1

## 2018-01-30 MED ORDER — PROMETHAZINE HCL 25 MG/ML IJ SOLN: 6.2500 mg | INTRAMUSCULAR | Status: DC | PRN

## 2018-01-30 MED ORDER — GABAPENTIN 300 MG PO CAPS
900.0000 mg | ORAL_CAPSULE | Freq: Every evening | ORAL | Status: DC
  Administered 2018-01-30 – 2018-02-01 (×3): 900 mg via ORAL
  Filled 2018-01-30 (×4): qty 3

## 2018-01-30 MED ORDER — SUGAMMADEX SODIUM 200 MG/2ML IV SOLN
INTRAVENOUS | Status: AC
  Filled 2018-01-30: qty 2

## 2018-01-30 MED ORDER — ONDANSETRON HCL 4 MG/2ML IJ SOLN: 4.0000 mg | Freq: Four times a day (QID) | INTRAMUSCULAR | Status: DC | PRN

## 2018-01-30 MED ORDER — SACUBITRIL-VALSARTAN 24-26 MG PO TABS
1.0000 | ORAL_TABLET | Freq: Two times a day (BID) | ORAL | Status: DC
  Administered 2018-01-30 – 2018-02-02 (×6): 1 via ORAL
  Filled 2018-01-30 (×6): qty 1

## 2018-01-30 MED ORDER — HYDROMORPHONE HCL 1 MG/ML IJ SOLN
0.2500 mg | INTRAMUSCULAR | Status: DC | PRN
  Administered 2018-01-30 (×3): 0.5 mg via INTRAVENOUS

## 2018-01-30 MED ORDER — LACTATED RINGERS IV SOLN
INTRAVENOUS | Status: DC
  Administered 2018-01-30: 13:00:00 via INTRAVENOUS

## 2018-01-30 MED ORDER — POTASSIUM CHLORIDE 10 MEQ/50ML IV SOLN: 10.0000 meq | Freq: Every day | INTRAVENOUS | Status: DC | PRN

## 2018-01-30 MED ORDER — COLCHICINE 0.6 MG PO TABS: 0.6000 mg | ORAL_TABLET | Freq: Every day | ORAL | Status: DC | PRN

## 2018-01-30 MED ORDER — OXYCODONE HCL 5 MG PO TABS
ORAL_TABLET | ORAL | Status: AC
  Filled 2018-01-30: qty 1

## 2018-01-30 MED ORDER — LIDOCAINE 2% (20 MG/ML) 5 ML SYRINGE
INTRAMUSCULAR | Status: AC
  Filled 2018-01-30: qty 5

## 2018-01-30 MED ORDER — PANTOPRAZOLE SODIUM 40 MG PO TBEC
40.0000 mg | DELAYED_RELEASE_TABLET | Freq: Every day | ORAL | Status: DC
  Administered 2018-01-31 – 2018-02-02 (×3): 40 mg via ORAL
  Filled 2018-01-30 (×3): qty 1

## 2018-01-30 MED ORDER — NALOXONE HCL 0.4 MG/ML IJ SOLN
0.4000 mg | INTRAMUSCULAR | Status: DC | PRN
  Filled 2018-01-30: qty 1

## 2018-01-30 MED ORDER — FENTANYL CITRATE (PF) 250 MCG/5ML IJ SOLN
INTRAMUSCULAR | Status: DC | PRN
  Administered 2018-01-30 (×3): 50 ug via INTRAVENOUS
  Administered 2018-01-30: 100 ug via INTRAVENOUS

## 2018-01-30 MED ORDER — ROCURONIUM BROMIDE 50 MG/5ML IV SOSY
PREFILLED_SYRINGE | INTRAVENOUS | Status: AC
  Filled 2018-01-30: qty 5

## 2018-01-30 MED ORDER — POTASSIUM CHLORIDE CRYS ER 20 MEQ PO TBCR
40.0000 meq | EXTENDED_RELEASE_TABLET | Freq: Every day | ORAL | Status: DC
  Administered 2018-01-30 – 2018-02-02 (×4): 40 meq via ORAL
  Filled 2018-01-30 (×4): qty 2

## 2018-01-30 MED ORDER — ATORVASTATIN CALCIUM 10 MG PO TABS
10.0000 mg | ORAL_TABLET | Freq: Every day | ORAL | Status: DC
  Administered 2018-01-30 – 2018-02-01 (×3): 10 mg via ORAL
  Filled 2018-01-30 (×3): qty 1

## 2018-01-30 MED ORDER — ACETAMINOPHEN 160 MG/5ML PO SOLN: 1000.0000 mg | Freq: Four times a day (QID) | ORAL | Status: DC

## 2018-01-30 MED ORDER — ALLOPURINOL 100 MG PO TABS
200.0000 mg | ORAL_TABLET | Freq: Every evening | ORAL | Status: DC
  Administered 2018-01-31 – 2018-02-01 (×2): 200 mg via ORAL
  Filled 2018-01-30 (×2): qty 2

## 2018-01-30 MED ORDER — ACETAMINOPHEN 500 MG PO TABS
1000.0000 mg | ORAL_TABLET | Freq: Four times a day (QID) | ORAL | Status: DC
  Administered 2018-01-30 – 2018-02-02 (×6): 1000 mg via ORAL
  Filled 2018-01-30 (×8): qty 2

## 2018-01-30 MED ORDER — TRAMADOL HCL 50 MG PO TABS: 50.0000 mg | ORAL_TABLET | Freq: Four times a day (QID) | ORAL | Status: DC | PRN

## 2018-01-30 MED ORDER — ALBUTEROL SULFATE (2.5 MG/3ML) 0.083% IN NEBU
2.5000 mg | INHALATION_SOLUTION | RESPIRATORY_TRACT | Status: DC
  Administered 2018-01-30: 2.5 mg via RESPIRATORY_TRACT
  Filled 2018-01-30: qty 3

## 2018-01-30 MED ORDER — LACTATED RINGERS IV SOLN
INTRAVENOUS | Status: DC | PRN
  Administered 2018-01-30: 14:00:00 via INTRAVENOUS

## 2018-01-30 MED ORDER — SODIUM CHLORIDE 0.9 % IV SOLN
INTRAVENOUS | Status: DC | PRN
  Administered 2018-01-30: 20 ug/min via INTRAVENOUS

## 2018-01-30 MED ORDER — DEXAMETHASONE SODIUM PHOSPHATE 10 MG/ML IJ SOLN
INTRAMUSCULAR | Status: AC
  Filled 2018-01-30: qty 1

## 2018-01-30 MED ORDER — AMLODIPINE BESYLATE 5 MG PO TABS
5.0000 mg | ORAL_TABLET | Freq: Every day | ORAL | Status: DC
  Administered 2018-01-31 – 2018-02-02 (×3): 5 mg via ORAL
  Filled 2018-01-30 (×3): qty 1

## 2018-01-30 MED ORDER — DULOXETINE HCL 30 MG PO CPEP
30.0000 mg | ORAL_CAPSULE | Freq: Every day | ORAL | Status: DC
  Administered 2018-01-31 – 2018-02-02 (×3): 30 mg via ORAL
  Filled 2018-01-30 (×3): qty 1

## 2018-01-30 MED ORDER — DIPHENHYDRAMINE HCL 12.5 MG/5ML PO ELIX
12.5000 mg | ORAL_SOLUTION | Freq: Four times a day (QID) | ORAL | Status: DC | PRN
  Filled 2018-01-30: qty 5

## 2018-01-30 MED ORDER — FENTANYL CITRATE (PF) 250 MCG/5ML IJ SOLN
INTRAMUSCULAR | Status: AC
  Filled 2018-01-30: qty 5

## 2018-01-30 MED ORDER — BISACODYL 5 MG PO TBEC
10.0000 mg | DELAYED_RELEASE_TABLET | Freq: Every day | ORAL | Status: DC
  Administered 2018-01-30 – 2018-02-02 (×4): 10 mg via ORAL
  Filled 2018-01-30 (×4): qty 2

## 2018-01-30 MED ORDER — METRONIDAZOLE 0.75 % EX GEL
1.0000 "application " | Freq: Two times a day (BID) | CUTANEOUS | Status: DC
  Administered 2018-01-31 – 2018-02-01 (×4): 1 via TOPICAL
  Filled 2018-01-30: qty 45

## 2018-01-30 MED ORDER — VANCOMYCIN HCL IN DEXTROSE 1-5 GM/200ML-% IV SOLN
1000.0000 mg | Freq: Two times a day (BID) | INTRAVENOUS | Status: AC
  Administered 2018-01-31: 1000 mg via INTRAVENOUS
  Filled 2018-01-30: qty 200

## 2018-01-30 MED ORDER — DEXAMETHASONE SODIUM PHOSPHATE 10 MG/ML IJ SOLN
INTRAMUSCULAR | Status: DC | PRN
  Administered 2018-01-30: 10 mg via INTRAVENOUS

## 2018-01-30 MED ORDER — FENTANYL 40 MCG/ML IV SOLN
INTRAVENOUS | Status: DC
  Administered 2018-01-30: 120 ug via INTRAVENOUS
  Administered 2018-01-30: 1000 ug via INTRAVENOUS
  Administered 2018-01-30: 90 ug via INTRAVENOUS
  Administered 2018-01-31: 70 ug via INTRAVENOUS
  Administered 2018-01-31 (×2): 10 ug via INTRAVENOUS
  Administered 2018-01-31: 20 ug via INTRAVENOUS
  Administered 2018-01-31: 0 ug via INTRAVENOUS
  Administered 2018-01-31: 20 ug via INTRAVENOUS
  Administered 2018-02-01: 0 ug via INTRAVENOUS
  Filled 2018-01-30: qty 25

## 2018-01-30 MED ORDER — IPRATROPIUM BROMIDE 0.06 % NA SOLN
2.0000 | Freq: Every day | NASAL | Status: DC
  Administered 2018-01-31 – 2018-02-02 (×3): 2 via NASAL
  Filled 2018-01-30: qty 15

## 2018-01-30 MED ORDER — DIGOXIN 125 MCG PO TABS
0.0625 mg | ORAL_TABLET | Freq: Every day | ORAL | Status: DC
  Administered 2018-01-31 – 2018-02-02 (×3): 0.0625 mg via ORAL
  Filled 2018-01-30 (×3): qty 1

## 2018-01-30 MED ORDER — SODIUM CHLORIDE 0.9% FLUSH: 9.0000 mL | INTRAVENOUS | Status: DC | PRN

## 2018-01-30 SURGICAL SUPPLY — 90 items
ADH SKN CLS APL DERMABOND .7 (GAUZE/BANDAGES/DRESSINGS) ×3
APL SKNCLS STERI-STRIP NONHPOA (GAUZE/BANDAGES/DRESSINGS)
BENZOIN TINCTURE PRP APPL 2/3 (GAUZE/BANDAGES/DRESSINGS) IMPLANT
CABLE SURGICAL S-101-97-12 (CABLE) ×4 IMPLANT
CANISTER SUCT 3000ML PPV (MISCELLANEOUS) ×10 IMPLANT
CATH KIT ON Q 5IN SLV (PAIN MANAGEMENT) IMPLANT
CATH THORACIC 28FR (CATHETERS) ×5 IMPLANT
CATH THORACIC 36FR (CATHETERS) IMPLANT
CATH THORACIC 36FR RT ANG (CATHETERS) IMPLANT
CLIP VESOCCLUDE MED 6/CT (CLIP) ×2 IMPLANT
CONN ST 1/4X3/8  BEN (MISCELLANEOUS) ×2
CONN ST 1/4X3/8 BEN (MISCELLANEOUS) ×1 IMPLANT
CONN Y 3/8X3/8X3/8  BEN (MISCELLANEOUS)
CONN Y 3/8X3/8X3/8 BEN (MISCELLANEOUS) IMPLANT
CONT SPEC 4OZ CLIKSEAL STRL BL (MISCELLANEOUS) ×13 IMPLANT
COVER SURGICAL LIGHT HANDLE (MISCELLANEOUS) ×10 IMPLANT
COVER WAND RF STERILE (DRAPES) ×5 IMPLANT
DERMABOND ADVANCED (GAUZE/BANDAGES/DRESSINGS) ×2
DERMABOND ADVANCED .7 DNX12 (GAUZE/BANDAGES/DRESSINGS) ×1 IMPLANT
DRAIN CHANNEL 28F RND 3/8 FF (WOUND CARE) ×3 IMPLANT
DRAIN CHANNEL 32F RND 10.7 FF (WOUND CARE) IMPLANT
DRAPE C-ARM 42X72 X-RAY (DRAPES) IMPLANT
DRAPE CARDIOVASCULAR INCISE (DRAPES) ×5
DRAPE CHEST BREAST 15X10 FENES (DRAPES) ×3 IMPLANT
DRAPE HALF SHEET 40X57 (DRAPES) ×2 IMPLANT
DRAPE INCISE IOBAN 66X45 STRL (DRAPES) ×5 IMPLANT
DRAPE LAPAROSCOPIC ABDOMINAL (DRAPES) ×5 IMPLANT
DRAPE SRG 135X102X78XABS (DRAPES) ×1 IMPLANT
DRAPE WARM FLUID 44X44 (DRAPE) ×5 IMPLANT
DRILL BIT 7/64X5 (BIT) ×5 IMPLANT
ELECT BLADE 4.0 EZ CLEAN MEGAD (MISCELLANEOUS) ×5
ELECT REM PT RETURN 9FT ADLT (ELECTROSURGICAL) ×5
ELECTRODE BLDE 4.0 EZ CLN MEGD (MISCELLANEOUS) ×2 IMPLANT
ELECTRODE REM PT RTRN 9FT ADLT (ELECTROSURGICAL) ×3 IMPLANT
GAUZE SPONGE 4X4 12PLY STRL (GAUZE/BANDAGES/DRESSINGS) ×5 IMPLANT
GAUZE SPONGE 4X4 12PLY STRL LF (GAUZE/BANDAGES/DRESSINGS) ×4 IMPLANT
GLOVE BIOGEL PI IND STRL 6 (GLOVE) ×1 IMPLANT
GLOVE BIOGEL PI IND STRL 6.5 (GLOVE) ×1 IMPLANT
GLOVE BIOGEL PI IND STRL 7.0 (GLOVE) ×2 IMPLANT
GLOVE BIOGEL PI INDICATOR 6 (GLOVE) ×2
GLOVE BIOGEL PI INDICATOR 6.5 (GLOVE) ×2
GLOVE BIOGEL PI INDICATOR 7.0 (GLOVE) ×2
GLOVE EUDERMIC 7 POWDERFREE (GLOVE) ×10 IMPLANT
GOWN STRL REUS W/ TWL LRG LVL3 (GOWN DISPOSABLE) ×5 IMPLANT
GOWN STRL REUS W/ TWL XL LVL3 (GOWN DISPOSABLE) ×4 IMPLANT
GOWN STRL REUS W/TWL LRG LVL3 (GOWN DISPOSABLE) ×5
GOWN STRL REUS W/TWL XL LVL3 (GOWN DISPOSABLE) ×10
IMPL BIOMEC 54 ~~LOC~~ (Pacemaker) ×2 IMPLANT
IMPLANT BIOMEC 54 ~~LOC~~ (Pacemaker) ×5 IMPLANT
KIT BASIN OR (CUSTOM PROCEDURE TRAY) ×5 IMPLANT
KIT TURNOVER KIT B (KITS) ×5 IMPLANT
NS IRRIG 1000ML POUR BTL (IV SOLUTION) ×20 IMPLANT
PACK CHEST (CUSTOM PROCEDURE TRAY) ×5 IMPLANT
PACK SURGICAL SETUP 50X90 (CUSTOM PROCEDURE TRAY) ×5 IMPLANT
PAD ARMBOARD 7.5X6 YLW CONV (MISCELLANEOUS) ×10 IMPLANT
PASSER SUT SWANSON 36MM LOOP (INSTRUMENTS) ×1 IMPLANT
SEALANT SURG COSEAL 4ML (VASCULAR PRODUCTS) IMPLANT
SEALANT SURG COSEAL 8ML (VASCULAR PRODUCTS) IMPLANT
SOLUTION ANTI FOG 6CC (MISCELLANEOUS) ×2 IMPLANT
SUT PROLENE 3 0 SH DA (SUTURE) IMPLANT
SUT SILK  1 MH (SUTURE) ×2
SUT SILK 1 MH (SUTURE) ×5 IMPLANT
SUT SILK 1 TIES 10X30 (SUTURE) IMPLANT
SUT SILK 2 0 (SUTURE)
SUT SILK 2 0 SH (SUTURE) ×4 IMPLANT
SUT SILK 2 0 SH CR/8 (SUTURE) ×3 IMPLANT
SUT SILK 2 0SH CR/8 30 (SUTURE) IMPLANT
SUT SILK 2-0 18XBRD TIE 12 (SUTURE) ×2 IMPLANT
SUT SILK 3 0SH CR/8 30 (SUTURE) IMPLANT
SUT VIC AB 1 CTX 36 (SUTURE) ×5
SUT VIC AB 1 CTX36XBRD ANBCTR (SUTURE) ×3 IMPLANT
SUT VIC AB 2-0 CT1 27 (SUTURE)
SUT VIC AB 2-0 CT1 TAPERPNT 27 (SUTURE) IMPLANT
SUT VIC AB 2-0 CTX 36 (SUTURE) ×5 IMPLANT
SUT VIC AB 2-0 UR6 27 (SUTURE) IMPLANT
SUT VIC AB 3-0 MH 27 (SUTURE) IMPLANT
SUT VIC AB 3-0 SH 27 (SUTURE)
SUT VIC AB 3-0 SH 27X BRD (SUTURE) IMPLANT
SUT VIC AB 3-0 X1 27 (SUTURE) ×5 IMPLANT
SUT VICRYL 2 TP 1 (SUTURE) ×5 IMPLANT
SYSTEM SAHARA CHEST DRAIN ATS (WOUND CARE) ×5 IMPLANT
TAPE CLOTH SURG 4X10 WHT LF (GAUZE/BANDAGES/DRESSINGS) ×3 IMPLANT
TIP APPLICATOR SPRAY EXTEND 16 (VASCULAR PRODUCTS) IMPLANT
TOWEL GREEN STERILE (TOWEL DISPOSABLE) ×5 IMPLANT
TOWEL GREEN STERILE FF (TOWEL DISPOSABLE) ×5 IMPLANT
TRAP SPECIMEN MUCOUS 40CC (MISCELLANEOUS) IMPLANT
TRAY FOLEY MTR SLVR 14FR STAT (SET/KITS/TRAYS/PACK) ×5 IMPLANT
TRAY FOLEY MTR SLVR 16FR STAT (SET/KITS/TRAYS/PACK) ×5 IMPLANT
TUNNELER SHEATH ON-Q 11GX8 DSP (PAIN MANAGEMENT) IMPLANT
WATER STERILE IRR 1000ML POUR (IV SOLUTION) ×10 IMPLANT

## 2018-01-30 NOTE — Brief Op Note (Signed)
01/30/2018  4:01 PM  PATIENT:  [name]  77 y.o. male  PRE-OPERATIVE DIAGNOSIS:  CARDIOMYOPATHY  POST-OPERATIVE DIAGNOSIS:  CARDIOMYOPATHY  PROCEDURE:  Procedure(s): LEFT THORACOTOMY (Left) ATTEMPTED LV EPICARDIAL PACING LEAD PLACEMENT AND REVISION OF BiV PACING SYSTEM (N/A)   Poor sensing and high pacing thresholds on multiple locations on the lateral wall. Lead not placed.   SURGEON:  Surgeon(s) and Role:    * Bartle, Fernande Boyden, MD - Primary  PHYSICIAN ASSISTANT: none  ASSISTANT: Alcide Goodness, RNFA   ANESTHESIA:   general  EBL:  20 mL   BLOOD ADMINISTERED:none  DRAINS: (one 71F) Blake drain(s) in the left pleural space.   LOCAL MEDICATIONS USED:  NONE  SPECIMEN:  No Specimen  DISPOSITION OF SPECIMEN:  N/A  COUNTS:  YES  TOURNIQUET:  * No tourniquets in log *  DICTATION: .Note written in EPIC  PLAN OF CARE: Admit to inpatient   PATIENT DISPOSITION:  PACU - hemodynamically stable.   Delay start of Pharmacological VTE agent (>24hrs) due to surgical blood loss or risk of bleeding: yes

## 2018-01-30 NOTE — Plan of Care (Signed)
  Problem: Education: Goal: Knowledge of General Education information will improve Description Including pain rating scale, medication(s)/side effects and non-pharmacologic comfort measures Outcome: Progressing   Problem: Health Behavior/Discharge Planning: Goal: Ability to manage health-related needs will improve Outcome: Progressing   Problem: Clinical Measurements: Goal: Ability to maintain clinical measurements within normal limits will improve Outcome: Progressing Goal: Will remain free from infection Outcome: Progressing Goal: Diagnostic test results will improve Outcome: Progressing Goal: Respiratory complications will improve Outcome: Progressing Goal: Cardiovascular complication will be avoided Outcome: Progressing   Problem: Activity: Goal: Risk for activity intolerance will decrease Outcome: Progressing   Problem: Nutrition: Goal: Adequate nutrition will be maintained Outcome: Progressing   Problem: Coping: Goal: Level of anxiety will decrease Outcome: Progressing   Problem: Elimination: Goal: Will not experience complications related to bowel motility Outcome: Progressing Goal: Will not experience complications related to urinary retention Outcome: Progressing   Problem: Pain Managment: Goal: General experience of comfort will improve Outcome: Progressing   Problem: Safety: Goal: Ability to remain free from injury will improve Outcome: Progressing   Problem: Skin Integrity: Goal: Risk for impaired skin integrity will decrease Outcome: Progressing   Problem: Education: Goal: Knowledge of disease or condition will improve Outcome: Progressing Goal: Knowledge of the prescribed therapeutic regimen will improve Outcome: Progressing   Problem: Activity: Goal: Risk for activity intolerance will decrease Outcome: Progressing   Problem: Cardiac: Goal: Will achieve and/or maintain hemodynamic stability Outcome: Progressing   Problem: Clinical  Measurements: Goal: Postoperative complications will be avoided or minimized Outcome: Progressing   Problem: Respiratory: Goal: Respiratory status will improve Outcome: Progressing   Problem: Pain Management: Goal: Pain level will decrease Outcome: Progressing   Problem: Skin Integrity: Goal: Wound healing without signs and symptoms infection will improve Outcome: Progressing   

## 2018-01-30 NOTE — Anesthesia Procedure Notes (Signed)
Arterial Line Insertion Start/End11/02/2018 1:00 PM, 01/30/2018 1:07 PM Performed by: Teressa Lower., CRNA, CRNA  Patient location: Pre-op. Preanesthetic checklist: patient identified, IV checked, site marked, risks and benefits discussed, surgical consent, monitors and equipment checked, pre-op evaluation, timeout performed and anesthesia consent Lidocaine 1% used for infiltration Left, radial was placed Catheter size: 20 G Hand hygiene performed , maximum sterile barriers used  and Seldinger technique used Allen's test indicative of satisfactory collateral circulation Attempts: 1 Procedure performed without using ultrasound guided technique. Following insertion, dressing applied and Biopatch. Post procedure assessment: normal and unchanged  Patient tolerated the procedure well with no immediate complications.

## 2018-01-30 NOTE — Interval H&P Note (Signed)
History and Physical Interval Note:  01/30/2018 1:12 PM  [name]  has presented today for surgery, with the diagnosis of CARDIOMYOPATHY  The various methods of treatment have been discussed with the patient and family. After consideration of risks, benefits and other options for treatment, the patient has consented to  Procedure(s): THORACOTOMY MAJOR (Left) LV EPICARDIAL PACING LEAD PLACEMENT AND REVISION OF BiV PACING SYSTEM (N/A) TRANSESOPHAGEAL ECHOCARDIOGRAM (TEE) (N/A) as a surgical intervention .  The patient's history has been reviewed, patient examined, no change in status, stable for surgery.  I have reviewed the patient's chart and labs.  Questions were answered to the patient's satisfaction.     Gaye Pollack

## 2018-01-30 NOTE — Anesthesia Procedure Notes (Signed)
Procedure Name: Intubation Date/Time: 01/30/2018 1:51 PM Performed by: Renato Shin, CRNA Pre-anesthesia Checklist: Patient identified, Emergency Drugs available, Suction available and Patient being monitored Patient Re-evaluated:Patient Re-evaluated prior to induction Oxygen Delivery Method: Circle system utilized Preoxygenation: Pre-oxygenation with 100% oxygen Induction Type: IV induction Ventilation: Oral airway inserted - appropriate to patient size Laryngoscope Size: Miller and 3 Grade View: Grade I Endobronchial tube: Left, Double lumen EBT, EBT position confirmed by fiberoptic bronchoscope and EBT position confirmed by auscultation and 41 Fr Number of attempts: 1 Airway Equipment and Method: Rigid stylet Placement Confirmation: ETT inserted through vocal cords under direct vision,  positive ETCO2 and CO2 detector Tube secured with: Tape Dental Injury: Teeth and Oropharynx as per pre-operative assessment

## 2018-01-30 NOTE — H&P (Signed)
[address]       [place] [address]             [phone]      Cardiothoracic Surgery Admission History and Physical   PCP is Quay Burow, Claudina Lick, MD  Referring Provider is [name], MD      Chief Complaint  Patient presents with  . Congestive Heart Failure        HPI:  The patient is a 77 year old gentleman with a history of diffuse vascular disease status post coronary bypass graft surgery in 2004 by me, abdominal aortic aneurysm repair by Dr. Amedeo Plenty in 2004, known 5.2 cm ascending aortic aneurysm, chronic atrial fibrillation on Coumadin, stage 3 CKD, COPD, chronotropic incompetence s/p PPM, ischemic cardiomyopathy with chronic systolic heart failure with an EF of 40% and grade 2 diastolic dysfunction. He currently has a [employer] CRT-D system. He has a Cardiomems device and has been followed closely by Dr. Aundra Dubin for management of his heart failure. He continues to have volume overload despite escalating doses of diuretic. He reports persistent generalized fatigue. His breathing is better with higher dose Torsemide and a dose of Metolazone a few weeks ago. He can walk on level ground but gets short of breath with any exertion or incline. Denies orthopnea and PND.  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 His coronary sinus LV lead had been turned off due to high threshold. Dr. Aundra Dubin turned it back on recently with increased output to allow pacing the LV but the QRS did not narrow and it was turned back off.  His last cath in 02/2016 showed severe native CAD with a patent LIMA to the LAD supplying a small to medium caliber distal LAD with 60% ostial LIMA stenosis. There was a patent SVG to the distal OM and a patent SVG to the PDA. He does complain of occasional chest pain that sounds atypical for angina.      Past Medical History:  Diagnosis Date  . AAA (abdominal aortic aneurysm) ([place])    Surgical repair 11/2002.  . Adenomatous  colon polyp   . Alcohol ingestion of more than four drinks per week    Excess beer not a dependency problem  . Aortic valve sclerosis   . Arthritis   . Atrial fibrillation ([place])    Previous long-term amiodarone therapy with multiple cardioversions / amiodarone stopped September, 2009  . [facility]rial [facility])    Started November, 2010, Left-sided and cannot ablate  . Bony abnormality    Patient's manubrium is slightly displaced to the right  . CAD (coronary artery disease)    a. s/p CABG 2004; b. 02/2016 Cath: LM nl, LAD 70p, 138m, D1 50, D2 50, RI 50ost, LCX 42m, OM2 100, OM3 100, RCA 70p, 100d, VG->OM3 ok, LIMA->LAD 60ost, VG->RPDA ok.  . 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 01/2013 MDT DTBA 1D1 [place] CRT-D, ser # [id number].  . Carotid artery disease ([place])    Doppler, December, 2013, 0-39% bilateral  . Chronic systolic CHF (congestive heart failure) ([place])    a. 02/2016 Echo: EF 35-40%, diff HK, triv AI, mildly dil Ao root 68mm, mild MR, sev dil LA, triv TR.  Marland Kitchen Chronotropic incompetence    IV pacing rate adjusted  . CKD (chronic kidney disease), stage III ([place])   . COPD (chronic obstructive pulmonary disease) ([place])   .  Dilated aortic root ([place])   . Discolored skin   . Diverticulosis   . Drug therapy    Redness and swelling with Avelox infusion May 24, 2011  . Eye abnormality    Ophthalmologist questions a clot in one of his eyes, May, 2012  . Gout   . Hyperlipidemia   . Hypertension   . Internal hemorrhoids   . Ischemic cardiomyopathy   . Left atrial thrombus    Remote past... cardioversions done since that time  . Mitral regurgitation    Mild echo  . Myocardial infarction ([place])   . Nasal drainage    Chronic  . Overweight(278.02)   . Pericardial effusion   . Pleural effusion    Large loculated effusion on the left side November, 2011. This was tapped. It was exudative. Cytology revealed no cancer no proof of mesothelioma area pulmonary  team felt that no further workup was needed  . Pleural thickening   . Pneumonia   . Polycythemia vera ([place]) 07/28/2014  . SOB (shortness of breath)    Large left effusion/ thoracentesis/hospitalization/November, 2011... exudated.. cytology negative.. Dr.Wert.. no proof of mesothelioma  . Spinal stenosis    Surgery Dr.Elsner  . Thrombophlebitis of superficial veins of upper extremities    Possible venous stenosis from defibrillator  . Type II diabetes mellitus ([place]) 2008  . Venous insufficiency    Toe discoloration chronic  . Ventral hernia    April, 2014, result of his abdominal surgery  . Warfarin anticoagulation   . Wide-complex tachycardia ([place])         Past Surgical History:  Procedure Laterality Date  . ABDOMINAL AORTIC ANEURYSM REPAIR  11/2002  . ANTERIOR CERVICAL DECOMP/DISCECTOMY FUSION  1995  . BACK SURGERY    . BI-VENTRICULAR PACEMAKER INSERTION (CRT-P)  02-11-2013   Pt with previously implanted MDT CRTD downgraded to CRTP by Dr Lovena Le 02-11-13  . CARDIAC CATHETERIZATION  "several"  . CARDIAC CATHETERIZATION N/A 03/18/2016   Procedure: RIGHT/LEFT HEART CATH AND CORONARY/GRAFT ANGIOGRAPHY; Surgeon: Nelva Bush, MD; Location: [facility]; Service: Cardiovascular; Laterality: N/A;  . CATARACT EXTRACTION W/ INTRAOCULAR LENS IMPLANT, BILATERAL    . COLONOSCOPY W/ POLYPECTOMY    . CORONARY ANGIOPLASTY WITH STENT PLACEMENT  2004  . CORONARY ARTERY BYPASS GRAFT  2004   CABG X4  . FRACTURE SURGERY    . IMPLANTABLE CARDIOVERTER DEFIBRILLATOR (ICD) GENERATOR CHANGE N/A 02/11/2013   Procedure: ICD GENERATOR CHANGE; Surgeon: Evans Lance, MD; Location: [facility] CATH LAB; Service: Cardiovascular; Laterality: N/A;  . INCISION AND DRAINAGE ABSCESS / HEMATOMA OF BURSA / KNEE / THIGH    . INSERT / REPLACE / REMOVE PACEMAKER  2009   original pacer/defibrillator; Dr. Lovena Le 2009... by the pacing  . LUMBAR FUSION  2010  . ORIF TIBIA & FIBULA FRACTURES Right 2000s  . PILONIDAL CYST  EXCISION    . RIGHT HEART CATH N/A 10/19/2016   Procedure: Right Heart Cath; Surgeon: [name], MD; Location: [facility]; Service: Cardiovascular; Laterality: N/A;  . RIGHT HEART CATH N/A 09/28/2017   Procedure: RIGHT HEART CATH; Surgeon: [name], MD; Location: [facility]; Service: Cardiovascular; Laterality: N/A;  . SPINAL CORD STIMULATOR IMPLANT  12/2011  . SURGERY SCROTAL / TESTICULAR          Family History  Problem Relation Age of Onset  . Hypertension Mother   . Stroke Mother   . Diabetes Father   . Coronary artery disease Father   . Other  Father    DVT  . Diabetes Brother   . Colon cancer Neg Hx   . Heart attack Neg Hx    Social History  Social History        Tobacco Use  . Smoking status: Former Smoker    Packs/day: 3.00    Years: 39.00    Pack years: 117.00    Types: Cigarettes    Start date: 05/24/1956    Last attempt to quit: 03/22/1995    Years since quitting: 22.8  . Smokeless tobacco: Never Used  Substance Use Topics  . Alcohol use: Yes    Alcohol/week: 2.0 standard drinks    Types: 2 Cans of beer per week  . Drug use: No         Current Outpatient Medications  Medication Sig Dispense Refill  . ACCU-CHEK AVIVA PLUS test strip USE 1 STRIP TO CHECK GLUCOSE THREE TIMES DAILY AS DIRECTED 300 each 3  . ACCU-CHEK FASTCLIX LANCETS MISC USE 1 LANCET TO CHECK GLUCOSE THREE TIMES DAILY 102 each 5  . acetaminophen (TYLENOL) 325 MG tablet Take 650-975 mg by mouth 4 (four) times daily as needed for moderate pain.     Marland Kitchen allopurinol (ZYLOPRIM) 100 MG tablet TAKE TWO TABLETS BY MOUTH ONCE DAILY 180 tablet 3  . amLODipine (NORVASC) 5 MG tablet Take 1 tablet (5 mg total) by mouth daily. 30 tablet 6  . atorvastatin (LIPITOR) 10 MG tablet Take 1 tablet (10 mg total) by mouth daily. 90 tablet 2  . carboxymethylcellulose (REFRESH PLUS) 0.5 % SOLN Place 2 drops into both eyes daily.     . carvedilol (COREG) 25 MG tablet TAKE 1 TABLET BY MOUTH TWICE  DAILY WITH A MEAL 60 tablet 11  . cholecalciferol (VITAMIN D) 1000 units tablet Take 1,000 Units by mouth daily.    . colchicine 0.6 MG tablet Take 0.6 mg by mouth daily as needed (gout flares).     Marland Kitchen digoxin (LANOXIN) 0.125 MG tablet Take 0.5 tablets (0.0625 mg total) by mouth daily. 15 tablet 6  . DULoxetine (CYMBALTA) 30 MG capsule Take 1 capsule (30 mg total) by mouth daily. 90 capsule 1  . gabapentin (NEURONTIN) 300 MG capsule Take 2 capsules (600 mg total) by mouth 3 (three) times daily. TAKE ONE CAPSULE BY MOUTH 5 TIMES DAILY AS NEEDED FOR LEG PAIN (Patient taking differently: Take 300-600 mg by mouth See admin instructions. TAKE 2 CAPSULES (600 MG) IN THE MORNING, 2 CAPSULES (600 MG) IN THE EVENING, & 1 CAPSULE (300 MG) BY MOUTH AT BEDTIME.) 540 capsule 3  . gabapentin (NEURONTIN) 300 MG capsule TAKE 1 CAPSULE BY MOUTH FIVE TIMES DAILY AS NEEDED LEG PAIN 450 capsule 3  . insulin NPH Human (NOVOLIN N) 100 UNIT/ML injection Inject 6-8 Units into the skin See admin instructions. Inject 8 units subcutaneously in the morning and inject 6 units subcutaneously in the evening.    . insulin regular (NOVOLIN R) 100 units/mL injection Inject 6 Units into the skin daily before supper.     Marland Kitchen ipratropium (ATROVENT) 0.06 % nasal spray Place 2 sprays into both nostrils daily.     . methocarbamol (ROBAXIN) 750 MG tablet Take 1 tablet (750 mg total) by mouth 2 (two) times daily as needed for muscle spasms. 90 tablet 1  . metolazone (ZAROXOLYN) 2.5 MG tablet Take only as prescribed by CHF clinic. 10 tablet 3  . metroNIDAZOLE (METROGEL) 0.75 % gel Apply 1 application topically daily as needed. FOR ROSACEA  2  .  nitroGLYCERIN (NITROSTAT) 0.4 MG SL tablet Place 1 tablet (0.4 mg total) under the tongue every 5 (five) minutes as needed for chest pain (MAX 3 TABLETS). 25 tablet 3  . omeprazole (PRILOSEC) 20 MG capsule Take 2 capsules (40 mg total) by mouth 2 (two) times daily. 120 capsule 11  . potassium chloride SA  (K-DUR,KLOR-CON) 20 MEQ tablet Take 2 tablets (40 mEq total) by mouth daily. 90 tablet 3  . sacubitril-valsartan (ENTRESTO) 24-26 MG Take 1 tablet by mouth 2 (two) times daily. 60 tablet 11  . torsemide (DEMADEX) 100 MG tablet Take 1 tablet (100 mg total) by mouth 2 (two) times daily. 60 tablet 3  . traMADol (ULTRAM) 50 MG tablet TAKE TWO TABLETS BY MOUTH EVERY 8 HOURS AS NEEDED FOR PAIN. 180 tablet 1  . warfarin (COUMADIN) 2.5 MG tablet TAKE 2 TABLETS (5 MG) BY MOUTH ON TUESDAYS AND SATURDAYS, THEN TAKE 1 TABLET (2.5 MG) BY MOUTH ON ALL OTHER DAYS. 120 tablet 1   No current facility-administered medications for this visit.         Allergies  Allergen Reactions  . Avelox [Moxifloxacin Hcl In Nacl] Swelling, Rash and Other (See Comments)    Patient became hypotensive after infusion started  Because of a history of documented adverse serious drug reaction;Medi Alert bracelet is recommended  . Other Rash and Swelling    Patient became hypotensive after infusion started  Because of a history of documented adverse serious drug reaction;Medi Alert bracelet is recommended  . Penicillins Anaphylaxis, Swelling and Other (See Comments)    REACTION: anaphylaxis  Because of a history of documented adverse serious drug reaction;Medi Alert bracelet is recommended  Has patient had a PCN reaction causing immediate rash, facial/tongue/throat swelling, SOB or lightheadedness with hypotension: Yes  Has patient had a PCN reaction causing severe rash involving mucus membranes or skin necrosis: No  Has patient had a PCN reaction that required hospitalization: Unknown  Has patient had a PCN reaction occurring within the last 10 years: No  REACTION: anaphylaxis  Because of a history of documented adverse serious drug reaction;Medi Alert bracelet is recommended  Has patient had a PCN reaction causing immediate rash, facial/tongue/throat swelling, SOB or lightheadedness with hypotension: Yes  Has patient had a PCN  reaction causing severe rash involving mucus membranes or skin necrosis: No  Has patient had a PCN reaction that required hospitalization: Unknown  Has patient had a PCN reaction occurring within the last 10 years: No   Review of Systems  Constitutional: Positive for fatigue.  HENT: Negative.  Eyes: Negative.  Respiratory: Positive for shortness of breath.  Cardiovascular: Negative for leg swelling.  Rare chest pain  Gastrointestinal: Negative.  Genitourinary: Negative.  Musculoskeletal: Positive for arthralgias and back pain.  Uses a can to ambulate  Allergic/Immunologic: Negative.  Neurological: Negative.  Hematological: Negative.  Psychiatric/Behavioral: Negative.   BP 119/76  Pulse 61  Resp 20  Ht 6' (1.829 m)  Wt 218 lb (98.9 kg)  SpO2 98% Comment: RA  BMI 29.57 kg/m  Physical Exam  Constitutional: He is oriented to person, place, and time. He appears well-developed and well-nourished. No distress.  HENT:  Head: Normocephalic and atraumatic.  Mouth/Throat: Oropharynx is clear and moist.  Eyes: Pupils are equal, round, and reactive to light. Conjunctivae and EOM are normal.  Neck: Normal range of motion. Neck supple. No JVD present. No thyromegaly present.  Cardiovascular: Normal rate and normal heart sounds.  No murmur heard. Irregular rhythm  Pacer generator  right anterior chest wall with thin skin over the upper end  Old sternotomy scar  Pulmonary/Chest: Effort normal and breath sounds normal. No respiratory distress.  Abdominal: Soft. Bowel sounds are normal. He exhibits no distension. There is no tenderness.  Musculoskeletal: Normal range of motion. He exhibits no edema.  Lymphadenopathy:  He has no cervical adenopathy.  Neurological: He is alert and oriented to person, place, and time.  Skin: Skin is warm and dry.  Psychiatric: He has a normal mood and affect.   Diagnostic Tests:  *[facility]*  [address], [place] [address]  [phone]  -------------------------------------------------------------------  Transthoracic Echocardiography  Patient: T[place], [place]  MR #: [id number]  Study Date: 01/04/2018  Gender: M  Age: 26  Height: 182.9 cm  Weight: 103.6 kg  BSA: 2.32 m^2  Pt. Status:  Room:  ATTENDING Loralie Champagne, M.D.  ORDERING Loralie Champagne, M.D.  SONOGRAPHER Johny Chess, RDCS, CCT  PERFORMING [facility], Outpatient  REFERRING Billey Gosling J  cc:  -------------------------------------------------------------------  LV EF: 40%  -------------------------------------------------------------------  Indications: CHF - 428.0.  -------------------------------------------------------------------  History: PMH: Chest pain. Dyspnea.  -------------------------------------------------------------------  Study Conclusions  - Left ventricle: The cavity size was normal. Wall thickness was  increased in a pattern of mild LVH. The estimated ejection  fraction was 40%. Septal-lateral dyssynchrony. Diffuse  hypokinesis. Features are consistent with a pseudonormal left  ventricular filling pattern, with concomitant abnormal relaxation  and increased filling pressure (grade 2 diastolic dysfunction).  - Aortic valve: There was no stenosis. There was trivial  regurgitation.  - Aorta: Mildly dilated ascending aorta. Aortic root dimension: 39  mm (ED). Ascending aortic diameter: 40 mm (S).  - Mitral valve: There was moderate regurgitation.  - Left atrium: The atrium was severely dilated.  - Right ventricle: The cavity size was mildly dilated. Pacer wire  or catheter noted in right ventricle. Systolic function was  mildly reduced.  - Right atrium: The atrium was moderately dilated.  - Tricuspid valve: Peak RV-RA gradient (S): 29 mm Hg.  - Pulmonary arteries: PA peak pressure: 32 mm Hg (S).  - Inferior vena cava: The vessel was normal in size. The  respirophasic diameter changes were  in the normal range (>= 50%),  consistent with normal central venous pressure.  Impressions:  - Normal LV size with mild LV hypertrophy, EF 35-40% with  septal-lateral dyssynchrony and diffuse hypokinesis. Mildly  dilated RV with mildly decreased systolic function. Biatrial  enlargement.  -------------------------------------------------------------------  Study data: Comparison was made to the study of 07/04/2017. Study  status: Routine. Procedure: Transthoracic echocardiography.  Image quality was fair. Study completion: There were no  complications. Transthoracic echocardiography. M-mode,  complete 2D, spectral Doppler, and color Doppler. Birthdate:  Patient birthdate: Dec 21, 1940. Age: Patient is 77 yr old. Sex:  Gender: male. BMI: 31 kg/m^2. Blood pressure: 100/61  Patient status: Outpatient. Study date: Study date: 01/04/2018.  Study time: 12:16 PM. Location: Echo laboratory.  -------------------------------------------------------------------  -------------------------------------------------------------------  Left ventricle: The cavity size was normal. Wall thickness was  increased in a pattern of mild LVH. The estimated ejection fraction  was 40%. Septal-lateral dyssynchrony. Diffuse hypokinesis. Features  are consistent with a pseudonormal left ventricular filling  pattern, with concomitant abnormal relaxation and increased filling  pressure (grade 2 diastolic dysfunction).  -------------------------------------------------------------------  Aortic valve: Trileaflet. Doppler: There was no stenosis.  There was trivial regurgitation.  -------------------------------------------------------------------  Aorta: Mildly dilated ascending aorta.  -------------------------------------------------------------------  Mitral valve:  Normal thickness leaflets . Doppler: There was  no evidence for stenosis. There was moderate regurgitation.   -------------------------------------------------------------------  Left atrium: The atrium was severely dilated.  -------------------------------------------------------------------  Right ventricle: The cavity size was mildly dilated. Pacer wire or  catheter noted in right ventricle. Systolic function was mildly  reduced.  -------------------------------------------------------------------  Pulmonic valve: Structurally normal valve. Cusp separation was  normal. Doppler: Transvalvular velocity was within the normal  range. There was trivial regurgitation.  -------------------------------------------------------------------  Tricuspid valve: Doppler: There was trivial regurgitation.  -------------------------------------------------------------------  Right atrium: The atrium was moderately dilated.  -------------------------------------------------------------------  Pericardium: There was no pericardial effusion.  -------------------------------------------------------------------  Systemic veins:  Inferior vena cava: The vessel was normal in size. The  respirophasic diameter changes were in the normal range (>= 50%),  consistent with normal central venous pressure.  -------------------------------------------------------------------  Post procedure conclusions  Ascending Aorta:  - Mildly dilated ascending aorta.  -------------------------------------------------------------------  Measurements  Left ventricle Value Reference  LV ID, ED, PLAX chordal (H) 54 mm 43 - 52  LV ID, ES, PLAX chordal (H) 45 mm 23 - 38  LV fx shortening, PLAX chordal (L) 17 % >=29  LV PW thickness, ED 15 mm ----------  IVS/LV PW ratio, ED 0.93 <=1.3  Stroke volume, 2D 77 ml ----------  Stroke volume/bsa, 2D 33 ml/m^2 ----------  Ventricular septum Value Reference  IVS thickness, ED 14 mm ----------  LVOT Value Reference  LVOT ID, S 23 mm ----------  LVOT area 4.15 cm^2 ----------  LVOT peak  velocity, S 90.5 cm/s ----------  LVOT mean velocity, S 60.9 cm/s ----------  LVOT VTI, S 18.5 cm ----------  Aorta Value Reference  Aortic root ID, ED 39 mm ----------  Ascending aorta ID, A-P, S 40 mm ----------  Left atrium Value Reference  LA ID, A-P, ES 64 mm ----------  LA ID/bsa, A-P (H) 2.76 cm/m^2 <=2.2  LA volume, S 195 ml ----------  LA volume/bsa, S 84 ml/m^2 ----------  LA volume, ES, 1-p A4C 241 ml ----------  LA volume/bsa, ES, 1-p A4C 103.9 ml/m^2 ----------  LA volume, ES, 1-p A2C 142 ml ----------  LA volume/bsa, ES, 1-p A2C 61.2 ml/m^2 ----------  Pulmonary arteries Value Reference  PA pressure, S, DP (H) 32 mm Hg <=30  Tricuspid valve Value Reference  Tricuspid regurg peak velocity 269 cm/s ----------  Tricuspid peak RV-RA gradient 29 mm Hg ----------  Right atrium Value Reference  RA ID, S-I, ES, A4C (H) 79.3 mm 34 - 49  RA area, ES, A4C (H) 30.6 cm^2 8.3 - 19.5  RA volume, ES, A/L 99.6 ml ----------  RA volume/bsa, ES, A/L 42.9 ml/m^2 ----------  Systemic veins Value Reference  Estimated CVP 3 mm Hg ----------  Right ventricle Value Reference  RV pressure, S, DP (H) 32 mm Hg <=30  Legend:  (L) and (H) mark values outside specified reference range.  -------------------------------------------------------------------  Prepared and Electronically Authenticated by  Loralie Champagne, M.D.  2019-10-17T13:07:55    Impression:   This 77 year old gentleman has ischemic cardiomyopathy with combined chronic systolic and diastolic congestive heart failure that is requiring escalating doses of medication including diuretics.  His QRS is 164 ms.  He has a biventricular pacing system in place but the left ventricular lead is not working appropriately and therefore it is felt that placing an epicardial left ventricular pacing lead is the best treatment to try to improve his symptoms.  I discussed the operative procedure of insertion of a left ventricular epicardial pacing  lead via a small left thoracotomy with revision of his biventricular pacing system with the patient and his wife.  We discussed the alternative of not performing the procedure, benefits and risk of the procedure including but not limited to bleeding, blood transfusion, infection, injury to the heart, failure to improve his ejection fraction or symptoms, pacing lead malfunction, and he understands and agrees to proceed.   Plan:   Left minithoracotomy for insertion of left ventricular epicardial pacing lead and revision of his biventricular pacing system.  Gaye Pollack, MD  [facility]  [phone]

## 2018-01-30 NOTE — Transfer of Care (Signed)
Immediate Anesthesia Transfer of Care Note  Patient: [name]  Procedure(s) Performed: LEFT THORACOTOMY (Left Chest) ATTEMPTED LV EPICARDIAL PACING LEAD PLACEMENT AND REVISION OF BiV PACING SYSTEM (N/A Chest)  Patient Location: PACU  Anesthesia Type:General  Level of Consciousness: awake, alert  and oriented  Airway & Oxygen Therapy: Patient Spontanous Breathing and Patient connected to face mask oxygen  Post-op Assessment: Report given to RN and Post -op Vital signs reviewed and stable  Post vital signs: Reviewed and stable  Last Vitals:  Vitals Value Taken Time  BP 123/74 01/30/2018  4:06 PM  Temp    Pulse 50 01/30/2018  4:07 PM  Resp 16 01/30/2018  4:07 PM  SpO2 100 % 01/30/2018  4:07 PM  Vitals shown include unvalidated device data.  Last Pain:  Vitals:   01/30/18 1238  TempSrc:   PainSc: 2          Complications: No apparent anesthesia complications

## 2018-01-30 NOTE — Op Note (Signed)
CARDIOTHORACIC SURGERY OPERATIVE NOTE  01/30/2018 [name] [id number]  Surgeon:  Gaye Pollack, MD  First Assistant: Dineen Kid, RNFA   Preoperative Diagnosis:  Ischemic cardiomyopathy with chronic combined systolic and diastolic congestive heart failure   Postoperative Diagnosis: same  Procedure:  1. Left anterolateral thoracotomy 2. Attempted insertion of LV epicardial pacing lead  Anesthesia:  General Endotracheal   Clinical History/Surgical Indication:  The patient is a 77 year old gentleman with a history of diffuse vascular disease status post coronary bypass graft surgery in 2004 by me, abdominal aortic aneurysm repair by Dr. Amedeo Plenty in 2004, known 5.2 cm ascending aortic aneurysm, chronic atrial fibrillation on Coumadin, stage 3 CKD, COPD, chronotropic incompetence s/p PPM, ischemic cardiomyopathy with chronic systolic heart failure with an EF of 40% and grade 2 diastolic dysfunction. He currently has a [employer] CRT-D system. He has a Cardiomems device and has been followed closely by Dr. Aundra Dubin for management of his heart failure. He continues to have volume overload despite escalating doses of diuretic. He reports persistent generalized fatigue. His breathing is better with higher dose Torsemide and a dose of Metolazone a few weeks ago. He can walk on level ground but gets short of breath with any exertion or incline. Denies orthopnea and PND.  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 His coronary sinus LV lead had been turned off due to high threshold. Dr. Aundra Dubin turned it back on recently with increased output to allow pacing the LV but the QRS did not narrow and it was turned back off.  His last cath in 02/2016 showed severe native CAD with a patent LIMA to the LAD supplying a small to medium caliber distal LAD with 60% ostial LIMA stenosis. There was a patent SVG to the distal OM and a patent SVG to the PDA. He does complain of occasional  chest pain that sounds atypical for angina.  He has ischemic cardiomyopathy with combined chronic systolic and diastolic congestive heart failure that is requiring escalating doses of medication including diuretics.  His QRS is 164 ms.  He has a biventricular pacing system in place but the left ventricular lead is not working appropriately and therefore it is felt that placing an epicardial left ventricular pacing lead is the best treatment to try to improve his symptoms.  I discussed the operative procedure of insertion of a left ventricular epicardial pacing lead via a small left thoracotomy with revision of his biventricular pacing system with the patient and his wife.  We discussed the alternative of not performing the procedure, benefits and risk of the procedure including but not limited to bleeding, blood transfusion, infection, injury to the heart, failure to improve his ejection fraction or symptoms, pacing lead malfunction, and he understands and agrees to proceed.    Preparation:  The patient was seen in the preoperative holding area and the correct patient, correct operation, correct operative side were confirmed with the patient after reviewing the medical record. The consent was signed by me. Preoperative antibiotics were given.  The patient was taken back to the operating room and positioned supine on the operating room table. After being placed under general endotracheal anesthesia by the anesthesia team using a double lumen tube a foley catheter was placed. A roll was place beneath the left back to tilt the left side up slightly. The chest was prepped with betadine soap and solution.  A surgical time-out was taken and the correct patient,operative side,  and operative procedure were confirmed with the nursing and anesthesia staff.   Operative Procedure:  A short anterolateral thoracotomy incision was made below the pectoralis border. The subcutaneous tissue was divided using  electrocautery. The serratus muscle was split along its fibers. The pleural space was entered through the 6th intercostal space. The pleura was markedly thickened with calcified pleural plaques that I had to divide with scissors. The pericardium was immediately adjacent to the chest wall due to cardiac enlargement. The pericardium was opened. There were dense adhesions between the pericardium and the heart which were carefully divided with scissors. The lateral wall of the LV was completely covered with fat. I dissected the pericardium as far as I could safely go and never saw any myocardium, only fat. I screwed a [employer] bipolar pacing lead into the lateral wall in 4 different locations and got poor sensing with a high pacing threshold in all locations. Therefore I felt that this lead was unsatisfactory and decided to abort the procedure without exposing the previous pacing system. There was complete hemostasis. A 28 F Blake drain was brought through a separate stab incision and positioned deep to the chest wall adjacent to the heart. The thoracotomy incision was closed using continuous #1 vicryl suture for the serratus muscle, 2-0 vicryl subcutaneous suture and 3-0 vicryl subcuticular suture.  All sponge, needle, and instrument counts were reported correct at the end of the case. Dry sterile dressings were placed over the incision and around the chest tube which was connected to pleurevac suction. The patient was extubated and transported to the PACU in satisfactory and stable condition.

## 2018-01-31 ENCOUNTER — Ambulatory Visit (INDEPENDENT_AMBULATORY_CARE_PROVIDER_SITE_OTHER): Payer: Medicare Other | Admitting: *Deleted

## 2018-01-31 ENCOUNTER — Inpatient Hospital Stay (HOSPITAL_COMMUNITY): Payer: Medicare Other

## 2018-01-31 ENCOUNTER — Other Ambulatory Visit: Payer: Self-pay | Admitting: *Deleted

## 2018-01-31 DIAGNOSIS — I5022 Chronic systolic (congestive) heart failure: Secondary | ICD-10-CM

## 2018-01-31 DIAGNOSIS — I255 Ischemic cardiomyopathy: Secondary | ICD-10-CM | POA: Diagnosis not present

## 2018-01-31 DIAGNOSIS — I712 Thoracic aortic aneurysm, without rupture, unspecified: Secondary | ICD-10-CM

## 2018-01-31 LAB — BASIC METABOLIC PANEL
Anion gap: 8 (ref 5–15)
BUN: 28 mg/dL — AB (ref 8–23)
CHLORIDE: 107 mmol/L (ref 98–111)
CO2: 21 mmol/L — ABNORMAL LOW (ref 22–32)
CREATININE: 1.5 mg/dL — AB (ref 0.61–1.24)
Calcium: 8.1 mg/dL — ABNORMAL LOW (ref 8.9–10.3)
GFR calc Af Amer: 50 mL/min — ABNORMAL LOW (ref >60)
GFR calc non Af Amer: 43 mL/min — ABNORMAL LOW (ref >60)
Glucose, Bld: 213 mg/dL — ABNORMAL HIGH (ref 70–99)
Potassium: 3.7 mmol/L (ref 3.5–5.1)
Sodium: 136 mmol/L (ref 135–145)

## 2018-01-31 LAB — CBC
HCT: 43.8 % (ref 39.0–52.0)
Hemoglobin: 13.6 g/dL (ref 13.0–17.0)
MCH: 28.9 pg (ref 26.0–34.0)
MCHC: 31.1 g/dL (ref 30.0–36.0)
MCV: 93 fL (ref 80.0–100.0)
PLATELETS: 159 10*3/uL (ref 150–400)
RBC: 4.71 MIL/uL (ref 4.22–5.81)
RDW: 16.5 % — ABNORMAL HIGH (ref 11.5–15.5)
WBC: 10 10*3/uL (ref 4.0–10.5)
nRBC: 0 % (ref 0.0–0.2)

## 2018-01-31 MED ORDER — ALBUTEROL SULFATE (2.5 MG/3ML) 0.083% IN NEBU: 2.5000 mg | INHALATION_SOLUTION | RESPIRATORY_TRACT | Status: DC | PRN

## 2018-01-31 MED ORDER — IPRATROPIUM-ALBUTEROL 0.5-2.5 (3) MG/3ML IN SOLN
3.0000 mL | Freq: Three times a day (TID) | RESPIRATORY_TRACT | Status: DC
  Administered 2018-01-31 – 2018-02-02 (×6): 3 mL via RESPIRATORY_TRACT
  Filled 2018-01-31 (×6): qty 3

## 2018-01-31 NOTE — Progress Notes (Signed)
Patient's son brought spinal stimulator remote to "turn on" implant. Stimulator successfully turned on.

## 2018-01-31 NOTE — Progress Notes (Signed)
Chest tube removed per order without complication, patient tolerated well. VSS, no increased work of breathing/SOB.  RN paged MD regarding patient's spinal stimulator, patient's wife said "they turned it off before surgery" and wanted to be sure it was turned back on. Dr. Cyndia Bent requested the wife bring the remote from home and turn on the stimulator. Wife notified, RN will follow up.

## 2018-01-31 NOTE — Progress Notes (Signed)
Remote ICD transmission.   

## 2018-01-31 NOTE — Plan of Care (Signed)
  Problem: Education: Goal: Knowledge of General Education information will improve Description Including pain rating scale, medication(s)/side effects and non-pharmacologic comfort measures Outcome: Progressing   Problem: Health Behavior/Discharge Planning: Goal: Ability to manage health-related needs will improve Outcome: Progressing   Problem: Clinical Measurements: Goal: Ability to maintain clinical measurements within normal limits will improve Outcome: Progressing Goal: Will remain free from infection Outcome: Progressing Goal: Diagnostic test results will improve Outcome: Progressing Goal: Respiratory complications will improve Outcome: Progressing Goal: Cardiovascular complication will be avoided Outcome: Progressing   Problem: Activity: Goal: Risk for activity intolerance will decrease Outcome: Progressing   Problem: Nutrition: Goal: Adequate nutrition will be maintained Outcome: Progressing   Problem: Coping: Goal: Level of anxiety will decrease Outcome: Progressing   Problem: Elimination: Goal: Will not experience complications related to bowel motility Outcome: Progressing Goal: Will not experience complications related to urinary retention Outcome: Progressing   Problem: Pain Managment: Goal: General experience of comfort will improve Outcome: Progressing   Problem: Safety: Goal: Ability to remain free from injury will improve Outcome: Progressing   Problem: Skin Integrity: Goal: Risk for impaired skin integrity will decrease Outcome: Progressing   Problem: Education: Goal: Knowledge of disease or condition will improve Outcome: Progressing Goal: Knowledge of the prescribed therapeutic regimen will improve Outcome: Progressing   Problem: Activity: Goal: Risk for activity intolerance will decrease Outcome: Progressing   Problem: Cardiac: Goal: Will achieve and/or maintain hemodynamic stability Outcome: Progressing   Problem: Clinical  Measurements: Goal: Postoperative complications will be avoided or minimized Outcome: Progressing   Problem: Respiratory: Goal: Respiratory status will improve Outcome: Progressing   Problem: Pain Management: Goal: Pain level will decrease Outcome: Progressing   Problem: Skin Integrity: Goal: Wound healing without signs and symptoms infection will improve Outcome: Progressing   

## 2018-01-31 NOTE — Progress Notes (Addendum)
      [address]       [place] [address]             [phone]      1 Day Post-Op Procedure(s) (LRB): LEFT THORACOTOMY (Left) ATTEMPTED LV EPICARDIAL PACING LEAD PLACEMENT AND REVISION OF BiV PACING SYSTEM (N/A) Subjective: Having pain at his chest tube site.  Objective: Vital signs in last 24 hours: Temp:  [97.5 F (36.4 C)-98.6 F (37 C)] 97.5 F (36.4 C) (11/13 0803) Pulse Rate:  [49-81] 81 (11/13 0400) Cardiac Rhythm: Atrial fibrillation;Bundle branch block (11/13 0700) Resp:  [12-24] 20 (11/13 0803) BP: (114-138)/(64-90) 114/80 (11/13 0803) SpO2:  [86 %-100 %] 86 % (11/13 0803) Arterial Line BP: (136-175)/(58-85) 157/82 (11/13 0400) Weight:  [104.3 kg] 104.3 kg (11/12 1148)     Intake/Output from previous day: 11/12 0701 - 11/13 0700 In: 2187.5 [P.O.:300; I.V.:1187.5; IV Piggyback:700] Out: 1030 [Urine:900; Blood:20; Chest Tube:110] Intake/Output this shift: No intake/output data recorded.  General appearance: alert, cooperative and no distress Heart: irregularly irregular, PPM Lungs: clear to auscultation bilaterally Abdomen: soft, non-tender; bowel sounds normal; no masses,  no organomegaly Extremities: extremities normal, atraumatic, no cyanosis or edema Wound: clean and dry  Lab Results: Recent Labs    01/31/18 0400  WBC 10.0  HGB 13.6  HCT 43.8  PLT 159   BMET:  Recent Labs    01/31/18 0400  NA 136  K 3.7  CL 107  CO2 21*  GLUCOSE 213*  BUN 28*  CREATININE 1.50*  CALCIUM 8.1*    PT/INR:  Recent Labs    01/30/18 1211  LABPROT 16.2*  INR 1.32   ABG    Component Value Date/Time   PHART 7.491 (H) 01/25/2018 0936   HCO3 24.1 01/25/2018 0936   TCO2 28 09/28/2017 0801   TCO2 28 09/28/2017 0801   ACIDBASEDEF 3.0 (H) 10/19/2016 1450   O2SAT 95.0 01/25/2018 0936   CBG (last 3)  Recent Labs    01/30/18 1153 01/30/18 1609 01/30/18 2223  GLUCAP 128* 94 206*    Assessment/Plan: S/P Procedure(s) (LRB): LEFT  THORACOTOMY (Left) ATTEMPTED LV EPICARDIAL PACING LEAD PLACEMENT AND REVISION OF BiV PACING SYSTEM (N/A)  1. CV-PPM, BP well controlled. Continue Norvasc, Coreg, Entresto and Dig.  2. Pulm-tolerating 2L [place]. Encouraged incentive spirometer.  3. Renal-creatinine 1.50, electrolytes okay. Continue Demadex for fluid overload 4. H and H stable 13.6/43.8 5. Endo-blood glucose moderately controlled.  6. Continue post-op antibiotics 7. Continue PCA for pain management  Plan: Discontinue arterial line and foley catheter. Probably can discontinue chest tube since there has only been 110cc since surgery output. No air leak. Work on ambulating in the halls today.    LOS: 1 day    Elgie Collard 01/31/2018   Chart reviewed, patient examined, agree with above.

## 2018-02-01 ENCOUNTER — Encounter: Payer: Self-pay | Admitting: Internal Medicine

## 2018-02-01 ENCOUNTER — Encounter (HOSPITAL_COMMUNITY): Payer: Self-pay | Admitting: Surgery

## 2018-02-01 ENCOUNTER — Inpatient Hospital Stay (HOSPITAL_COMMUNITY): Payer: Medicare Other

## 2018-02-01 ENCOUNTER — Encounter (HOSPITAL_COMMUNITY): Payer: Medicare Other

## 2018-02-01 MED ORDER — WARFARIN SODIUM 2.5 MG PO TABS
2.5000 mg | ORAL_TABLET | Freq: Once | ORAL | Status: AC
  Administered 2018-02-01: 2.5 mg via ORAL
  Filled 2018-02-01: qty 1

## 2018-02-01 MED ORDER — WARFARIN - PHYSICIAN DOSING INPATIENT: Freq: Every day | Status: DC

## 2018-02-01 NOTE — Progress Notes (Signed)
Desats to 80's on room air, placed back on o2 2l [place], continue to monitor.

## 2018-02-01 NOTE — Progress Notes (Signed)
Ambulated along the hallway with walker and o2 , with brief stop sec, to pain on surgical sit and slight shortness of breath.

## 2018-02-01 NOTE — Progress Notes (Signed)
2 Days Post-Op Procedure(s) (LRB): LEFT THORACOTOMY (Left) ATTEMPTED LV EPICARDIAL PACING LEAD PLACEMENT AND REVISION OF BiV PACING SYSTEM (N/A) Subjective: Mild incisional pain  Objective: Vital signs in last 24 hours: Temp:  [97.7 F (36.5 C)-98.7 F (37.1 C)] 98.7 F (37.1 C) (11/14 0342) Pulse Rate:  [66-105] 66 (11/13 2100) Cardiac Rhythm: Atrial fibrillation;Bundle branch block (11/14 0708) Resp:  [15-35] 35 (11/14 0654) BP: (116-134)/(69-81) 129/79 (11/14 0342) SpO2:  [90 %-97 %] 91 % (11/14 0654) Arterial Line BP: (161)/(76) 161/76 (11/13 0900)  Hemodynamic parameters for last 24 hours:    Intake/Output from previous day: 11/13 0701 - 11/14 0700 In: 600 [P.O.:600] Out: 1840 [Urine:1800; Chest Tube:40] Intake/Output this shift: No intake/output data recorded.  General appearance: alert and cooperative Heart: irregular rate and rhythm, S1, S2 normal, no murmur Lungs: clear to auscultation bilaterally Wound: incision ok  Lab Results: Recent Labs    01/31/18 0400  WBC 10.0  HGB 13.6  HCT 43.8  PLT 159   BMET:  Recent Labs    01/31/18 0400  NA 136  K 3.7  CL 107  CO2 21*  GLUCOSE 213*  BUN 28*  CREATININE 1.50*  CALCIUM 8.1*    PT/INR:  Recent Labs    01/30/18 1211  LABPROT 16.2*  INR 1.32   ABG    Component Value Date/Time   PHART 7.491 (H) 01/25/2018 0936   HCO3 24.1 01/25/2018 0936   TCO2 28 09/28/2017 0801   TCO2 28 09/28/2017 0801   ACIDBASEDEF 3.0 (H) 10/19/2016 1450   O2SAT 95.0 01/25/2018 0936   CBG (last 3)  Recent Labs    01/30/18 1153 01/30/18 1609 01/30/18 2223  GLUCAP 128* 94 206*   CXR: mild left basilar atelectasis  Assessment/Plan: S/P Procedure(s) (LRB): LEFT THORACOTOMY (Left) ATTEMPTED LV EPICARDIAL PACING LEAD PLACEMENT AND REVISION OF BiV PACING SYSTEM (N/A)  He is hemodynamically stable. DC PCA Wean oxygen Continue IS and ambulation He can go home later today if stable off oxygen and comfortable oral  pain meds, otherwise tomorrow. Resume Coumadin at previous dose.   LOS: 2 days    Gaye Pollack 02/01/2018

## 2018-02-01 NOTE — Progress Notes (Signed)
Fentanyl  14 ml wasted in the container, witnessed by RN.

## 2018-02-01 NOTE — Anesthesia Postprocedure Evaluation (Signed)
Anesthesia Post Note  Patient: [name]  Procedure(s) Performed: LEFT THORACOTOMY (Left Chest) ATTEMPTED LV EPICARDIAL PACING LEAD PLACEMENT AND REVISION OF BiV PACING SYSTEM (N/A Chest)     Patient location during evaluation: PACU Anesthesia Type: General Level of consciousness: awake and alert Pain management: pain level controlled Vital Signs Assessment: post-procedure vital signs reviewed and stable Respiratory status: spontaneous breathing, nonlabored ventilation, respiratory function stable and patient connected to nasal cannula oxygen Cardiovascular status: blood pressure returned to baseline and stable Postop Assessment: no apparent nausea or vomiting Anesthetic complications: no    Last Vitals:  Vitals:   02/01/18 0342 02/01/18 0654  BP: 129/79   Pulse:    Resp:  (!) 35  Temp: 37.1 C   SpO2:  91%    Last Pain:  Vitals:   02/01/18 0342  TempSrc: Oral  PainSc:                  Tiajuana Amass

## 2018-02-02 ENCOUNTER — Telehealth: Payer: Self-pay | Admitting: Internal Medicine

## 2018-02-02 MED ORDER — OXYCODONE HCL 5 MG PO TABS: 5.0000 mg | ORAL_TABLET | Freq: Four times a day (QID) | ORAL | 0 refills | Status: AC | PRN

## 2018-02-02 NOTE — Progress Notes (Signed)
Pt going home with wife with oxygen at 2l/min, with belongings. Prescription, follow up appt. And discharge instructions given to wife and patient. No complaints at this time.

## 2018-02-02 NOTE — Progress Notes (Signed)
3 Days Post-Op Procedure(s) (LRB): LEFT THORACOTOMY (Left) ATTEMPTED LV EPICARDIAL PACING LEAD PLACEMENT AND REVISION OF BiV PACING SYSTEM (N/A) Subjective: No complaints. He is anxious to go home.  Objective: Vital signs in last 24 hours: Temp:  [97.5 F (36.4 C)-98.1 F (36.7 C)] 98.1 F (36.7 C) (11/15 0756) Pulse Rate:  [61-100] 91 (11/15 0756) Cardiac Rhythm: Atrial fibrillation (11/15 0700) Resp:  [16-25] 18 (11/15 0756) BP: (99-126)/(60-88) 122/62 (11/15 0756) SpO2:  [90 %-97 %] 97 % (11/15 0756)  Hemodynamic parameters for last 24 hours:    Intake/Output from previous day: 11/14 0701 - 11/15 0700 In: 120 [P.O.:120] Out: 2500 [Urine:2500] Intake/Output this shift: No intake/output data recorded.  General appearance: alert and cooperative Heart: irregularly irregular rhythm Lungs: clear to auscultation bilaterally Wound: incision ok  Lab Results: Recent Labs    01/31/18 0400  WBC 10.0  HGB 13.6  HCT 43.8  PLT 159   BMET:  Recent Labs    01/31/18 0400  NA 136  K 3.7  CL 107  CO2 21*  GLUCOSE 213*  BUN 28*  CREATININE 1.50*  CALCIUM 8.1*    PT/INR:  Recent Labs    01/30/18 1211  LABPROT 16.2*  INR 1.32   ABG    Component Value Date/Time   PHART 7.491 (H) 01/25/2018 0936   HCO3 24.1 01/25/2018 0936   TCO2 28 09/28/2017 0801   TCO2 28 09/28/2017 0801   ACIDBASEDEF 3.0 (H) 10/19/2016 1450   O2SAT 95.0 01/25/2018 0936   CBG (last 3)  Recent Labs    01/30/18 1153 01/30/18 1609 01/30/18 2223  GLUCAP 128* 94 206*    Assessment/Plan: S/P Procedure(s) (LRB): LEFT THORACOTOMY (Left) ATTEMPTED LV EPICARDIAL PACING LEAD PLACEMENT AND REVISION OF BiV PACING SYSTEM (N/A)  He has been hemodynamically stable. Sats drop into the low 80's with ambulation on room air so I think he should have home oxygen if he needs it. Sats with ambulation may improve with time as he continues ambulating and may not need long term oxygen.  Chest tube suture to  stay in and I will see in office in 2 weeks with CXR and will remove the suture then.   LOS: 3 days    [name] 02/02/2018

## 2018-02-02 NOTE — Discharge Summary (Signed)
Physician Discharge Summary  Patient ID: [name] MRN: [id number] DOB/AGE: [birth year] 77 y.o.  Admit date: 01/30/2018 Discharge date: 02/02/2018  Admission Diagnoses: Congestive heart failure  Discharge Diagnoses:  Active Problems:   S/P thoracotomy  Patient Active Problem List   Diagnosis Date Noted  . S/P thoracotomy 01/30/2018  . Acute bronchitis 05/29/2016  . Abnormal CXR 05/29/2016  . Effusion of left elbow 04/13/2016  . Angina pectoris ([place]) 03/15/2016  . History of gout 10/09/2015  . Polycythemia vera ([place]) 07/28/2014  . Ascending aortic aneurysm ([place]) 05/10/2014  . Hypoxia 05/05/2014  . COPD exacerbation ([place]) 05/05/2014  . Diabetes ([place]) 05/05/2014  . Chronic systolic CHF (congestive heart failure) ([place]) 03/07/2014  . History of DVT (deep vein thrombosis) 03/07/2014  . Cardiomyopathy, ischemic 02/05/2014  . Pleural plaque without asbestosis 01/23/2014  . Elevated hemoglobin ([place]) 01/14/2014  . CKD (chronic kidney disease) stage 3, GFR 30-59 ml/min (HCC) 01/14/2014  . Asbestos exposure 01/14/2014  . Shortness of breath 01/01/2014  . Ejection fraction   . Hypopotassemia 12/20/2012  . ACE-inhibitor cough 10/04/2012  . Ventral hernia   . Bony abnormality   . Carotid artery disease ([place])   . Spinal cord stimulator status   . Peripheral vascular disease with claudication ([place]) 08/31/2011  . Atrial fibrillation with rapid ventricular response ([place])   . Spinal stenosis   . CAD (coronary artery disease)   . Venous insufficiency   . Mitral regurgitation   . Aortic valve sclerosis   . Chronotropic incompetence   . Thrombophlebitis of superficial veins of upper extremities   . Pericardial effusion   . Overweight(278.02)   . Pleural effusion   . Hyperlipidemia   . Hypertension   . Atrial flutter ([place])   . Left atrial thrombus   . AAA (abdominal aortic aneurysm) ([place])   . S/P ICD (internal cardiac defibrillator) procedure   . Warfarin anticoagulation    . Diabetic polyneuropathy ([place]) 01/07/2010  . DIVERTICULOSIS, COLON 06/10/2009  . BACK PAIN, LUMBAR 06/12/2008  . 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 Acute gout 07/27/2006  . CORONARY ARTERY BYPASS GRAFT, HX OF 07/27/2006   History of present illness:  Patient is a 77 year old gentleman with a history of diffuse vascular disease who is status post coronary artery bypass grafting by Dr. Cyndia [name] in 2004 as well as abdominal aortic aneurysm repair by Dr. Amedeo [name] also in 2004.  Has multiple medical comorbidities as seen above.  He also was noted to have an ejection fraction of 40% and grade 2 diastolic dysfunction.  He has a [employer] CRT-D-D system.  He has a cardiomems device and is followed closely by Dr. Aundra [name] to assist with his heart failure.  He is noted to have continued volume overload despite escalating doses of diuretic.  He reports persistent generalized fatigue.  He was noted to be RV pacing 35% of the time and native QRS is a left bundle branch block at 164 ms.  His coronary sinus LV lead has been turned off due to high threshold.  Dr. Algernon [name] turned to back on recently with increased output to allow pacing the LV but the QRS did not narrow and it was turned back off.  His last cath in December 2017 showed severe native coronary artery disease with a patent LIMA to LAD supplying a small to medium caliber distal LAD with 60% ostial LIMA stenosis.  There is patent saphenous vein graft to the distal OM and a patent saphenous vein graft to  the PDA.  He does have symptoms of occasional chest pain but these are felt to be atypical for angina.  He was referred to Dr. Cyndia [name] for consideration of placing a left ventricular epicardial pacing lead via a small left thoracotomy with revision of his biventricular pacing system.  Dr. Mohammed [name] evaluated the patient and his studies and agreed with recommendations and he was therefore admitted this hospitalization for the  procedure.  Discharged Condition: good  Hospital Course: The patient was admitted electively and on 01/30/2018 he was taken to the operating room where he underwent the below described procedure.  He tolerated well was taken to the postanesthesia care unit in stable condition.  Postoperative hospital course:  The patient is overall done quite well.  He has remained stable hemodynamically.  He has had difficulty maintaining oxygen saturations at home oxygen has been arranged.  He is tolerating gradually increasing activity and in particular ambulation is improving. All routine lines, monitors and drainage devices have otherwise been discontinued.  His Coumadin will be resumed at its previous dose at time of discharge.  Incision is healing without evidence of infection.  Overall at the time of discharge the patient is felt to be quite stable.  Consults: None  Significant Diagnostic Studies: Routine postoperative serial labs and chest x-rays.  Treatments: surgery:    CARDIOTHORACIC SURGERY OPERATIVE NOTE  01/30/2018 [name] [id number]  Surgeon:  [name] Pollack, MD  First Assistant: [name], RNFA   Preoperative Diagnosis:  Ischemic cardiomyopathy with chronic combined systolic and diastolic congestive heart failure   Postoperative Diagnosis: same  Procedure:  1. Left anterolateral thoracotomy 2. Attempted insertion of LV epicardial pacing lead  Anesthesia:  General Endotracheal  Discharge Exam: Blood pressure 122/62, pulse 70, temperature 98.1 F (36.7 C), temperature source Oral, resp. rate 16, height 6' (1.829 m), weight 104.3 kg, SpO2 91 %.  General appearance: alert and cooperative Heart: irregularly irregular rhythm Lungs: clear to auscultation bilaterally Wound: incision ok  Disposition: Discharge disposition: 01-Home or Self Care       Discharge Instructions    Discharge patient   Complete by:  As directed    After [facility] oxygen  arranged   Discharge disposition:  01-Home or Self Care   Discharge patient date:  02/02/2018     Allergies as of 02/02/2018      Reactions   Avelox [moxifloxacin Hcl In Nacl] Swelling, Rash, Other (See Comments)   Patient became hypotensive after infusion started Because of a history of documented adverse serious drug reaction;Medi Alert bracelet  is recommended   Other Rash, Swelling   Patient became hypotensive after infusion started Because of a history of documented adverse serious drug reaction;Medi Alert braceletis recommended   Penicillins Anaphylaxis, Swelling, Other (See Comments)   Because of a history of documented adverse serious drug reaction;Medi Alert bracelet  is recommended Has patient had a PCN reaction causing immediate rash, facial/tongue/throat swelling, SOB or lightheadedness with hypotension: Yes Has patient had a PCN reaction causing severe rash involving mucus membranes or skin necrosis: No Has patient had a PCN reaction that required hospitalization: Unknown Has patient had a PCN reaction occurring within the last 10 years: No      Medication List    STOP taking these medications   traMADol 50 MG tablet Commonly known as:  ULTRAM     TAKE these medications   ACCU-CHEK AVIVA PLUS test strip Generic drug:  glucose blood USE 1 STRIP TO CHECK GLUCOSE THREE TIMES  DAILY AS DIRECTED   ACCU-CHEK FASTCLIX LANCETS Misc USE 1 LANCET TO CHECK GLUCOSE THREE TIMES DAILY   acetaminophen 325 MG tablet Commonly known as:  TYLENOL Take 650-975 mg by mouth 3 (three) times daily as needed for moderate pain.   allopurinol 100 MG tablet Commonly known as:  ZYLOPRIM TAKE TWO TABLETS BY MOUTH ONCE DAILY What changed:  when to take this   amLODipine 5 MG tablet Commonly known as:  NORVASC Take 1 tablet (5 mg total) by mouth daily.   atorvastatin 10 MG tablet Commonly known as:  LIPITOR Take 1 tablet (10 mg total) by mouth daily.   carboxymethylcellulose 0.5 %  Soln Commonly known as:  REFRESH PLUS Place 2 drops into both eyes daily.   carvedilol 25 MG tablet Commonly known as:  COREG TAKE 1 TABLET BY MOUTH TWICE DAILY WITH A MEAL What changed:  See the new instructions.   cholecalciferol 25 MCG (1000 UT) tablet Commonly known as:  VITAMIN D Take 1,000 Units by mouth daily.   colchicine 0.6 MG tablet Take 0.6 mg by mouth daily as needed (gout flares).   digoxin 0.125 MG tablet Commonly known as:  LANOXIN Take 0.5 tablets (0.0625 mg total) by mouth daily. What changed:  how much to take   DULoxetine 30 MG capsule Commonly known as:  CYMBALTA Take 1 capsule (30 mg total) by mouth daily.   gabapentin 300 MG capsule Commonly known as:  NEURONTIN Take 2 capsules (600 mg total) by mouth 3 (three) times daily. TAKE ONE CAPSULE BY MOUTH 5 TIMES DAILY AS NEEDED FOR LEG PAIN What changed:    how much to take  when to take this  additional instructions   ipratropium 0.06 % nasal spray Commonly known as:  ATROVENT Place 2 sprays into both nostrils daily.   methocarbamol 750 MG tablet Commonly known as:  ROBAXIN Take 1 tablet (750 mg total) by mouth 2 (two) times daily as needed for muscle spasms.   metolazone 2.5 MG tablet Commonly known as:  ZAROXOLYN Take only as prescribed by CHF clinic. What changed:    how much to take  how to take this  when to take this  additional instructions   metroNIDAZOLE 0.75 % gel Commonly known as:  METROGEL Apply 1 application topically daily as needed (rosacea).   nitroGLYCERIN 0.4 MG SL tablet Commonly known as:  NITROSTAT Place 1 tablet (0.4 mg total) under the tongue every 5 (five) minutes as needed for chest pain (MAX 3 TABLETS).   NOVOLIN N 100 UNIT/ML injection Generic drug:  insulin NPH Human Inject 6-8 Units into the skin See admin instructions. Inject 8 units subcutaneously in the morning and inject 6 units subcutaneously in the evening.   NOVOLIN R 100 units/mL  injection Generic drug:  insulin regular Inject 6 Units into the skin daily before supper.   omeprazole 20 MG capsule Commonly known as:  PRILOSEC Take 2 capsules (40 mg total) by mouth 2 (two) times daily. What changed:  when to take this   oxyCODONE 5 MG immediate release tablet Commonly known as:  Oxy IR/ROXICODONE Take 1-2 tablets (5-10 mg total) by mouth every 6 (six) hours as needed for up to 5 days for severe pain.   potassium chloride SA 20 MEQ tablet Commonly known as:  K-DUR,KLOR-CON Take 2 tablets (40 mEq total) by mouth daily.   sacubitril-valsartan 24-26 MG Commonly known as:  ENTRESTO Take 1 tablet by mouth 2 (two) times daily.   torsemide  100 MG tablet Commonly known as:  DEMADEX Take 1 tablet (100 mg total) by mouth 2 (two) times daily.   warfarin 2.5 MG tablet Commonly known as:  COUMADIN Take as directed. If you are unsure how to take this medication, talk to your nurse or doctor. Original instructions:  TAKE 2 TABLETS (5 MG) BY MOUTH ON TUESDAYS AND SATURDAYS, THEN TAKE 1 TABLET (2.5 MG) BY MOUTH ON ALL OTHER DAYS. What changed:  additional instructions            Durable Medical Equipment  (From admission, onward)         Start     Ordered   02/02/18 0840  For home use only DME oxygen  Once    Question:  Oxygen delivery system  Answer:  Gas   02/02/18 0840         Follow-up Information    [name] Pollack, MD Follow up.   Specialty:  Cardiothoracic Surgery Why:  Appointment see Dr. Cyndia [name] on 02/14/2018 at 11 AM.  Chest x-ray at [facility] imaging at 10:30 AM.  [facility] imaging is located in the same office complex on the first floor. Contact information: [address] [place] [address] [phone]           Signed: John Giovanni PA-C 02/02/2018, 10:18 AM

## 2018-02-02 NOTE — Progress Notes (Addendum)
SATURATION QUALIFICATIONS: (This note is used to comply with regulatory documentation for home oxygen)  Patient Saturations on Room Air at Rest = 85%  Patient Saturations on Room Air while Ambulating = 74%  Patient Saturations on 2 Liters of oxygen while Ambulating = 91%  Please briefly explain why patient needs home oxygen:

## 2018-02-02 NOTE — Plan of Care (Signed)
  Problem: Education: Goal: Knowledge of General Education information will improve Description Including pain rating scale, medication(s)/side effects and non-pharmacologic comfort measures Outcome: Completed/Met   Problem: Health Behavior/Discharge Planning: Goal: Ability to manage health-related needs will improve Outcome: Completed/Met   Problem: Clinical Measurements: Goal: Ability to maintain clinical measurements within normal limits will improve Outcome: Completed/Met Goal: Will remain free from infection Outcome: Completed/Met Goal: Diagnostic test results will improve Outcome: Completed/Met Goal: Respiratory complications will improve Outcome: adequate for discharge Problem: Clinical Measurements: Goal: Respiratory complications will improve Outcome: Completed/Met   Goal: Cardiovascular complication will be avoided Outcome: Completed/Met   Problem: Activity: Goal: Risk for activity intolerance will decrease Outcome: Completed/Met   Problem: Nutrition: Goal: Adequate nutrition will be maintained Outcome: Completed/Met   Problem: Coping: Goal: Level of anxiety will decrease Outcome: Completed/Met   Problem: Elimination: Goal: Will not experience complications related to bowel motility Outcome: Completed/Met Goal: Will not experience complications related to urinary retention Outcome: Completed/Met   Problem: Pain Managment: Goal: General experience of comfort will improve Outcome: Completed/Met   Problem: Safety: Goal: Ability to remain free from injury will improve Outcome: Completed/Met   Problem: Skin Integrity: Goal: Risk for impaired skin integrity will decrease Outcome: Completed/Met   Problem: Education: Goal: Knowledge of disease or condition will improve Outcome: Completed/Met Goal: Knowledge of the prescribed therapeutic regimen will improve Outcome: Completed/Met   Problem: Activity: Goal: Risk for activity intolerance will  decrease Outcome: Completed/Met   Problem: Cardiac: Goal: Will achieve and/or maintain hemodynamic stability Outcome: Completed/Met   Problem: Clinical Measurements: Goal: Postoperative complications will be avoided or minimized Outcome: Completed/Met   Problem: Respiratory: Goal: Respiratory status will improve Outcome: Completed/Met   Problem: Pain Management: Goal: Pain level will decrease Outcome: Completed/Met   Problem: Skin Integrity: Goal: Wound healing without signs and symptoms infection will improve Outcome: Completed/Met  Pt going home today with oxygen

## 2018-02-02 NOTE — Care Management Important Message (Signed)
Important Message  Patient Details  Name: [name] MRN: [id number] Date of Birth: May 07, 1940   Medicare Important Message Given:  Yes    [name] 02/02/2018, 3:13 PM

## 2018-02-02 NOTE — Care Management Note (Signed)
Case Management Note  Patient Details  Name: [name] MRN: [id number] Date of Birth: 14-Sep-1940  Subjective/Objective:  Pt is s/p thorocatomy                  Action/Plan:  PTA independent from home with wife.  Pt has PCP and denied barriers with paying for discharge medications.  Pt in agreement with home oxygen - pt/wife choice [facility] - agency contacted and referral accepted.  NO other CM needs determined at the time this note was written.   Expected Discharge Date:  02/02/18               Expected Discharge Plan:  Home/Self Care  In-House Referral:     Discharge planning Services  CM Consult  Post Acute Care Choice:  Durable Medical Equipment Choice offered to:  Patient  DME Arranged:  Oxygen DME Agency:  [place]:    [facility] Agency:     Status of Service:  Completed, signed off  If discussed at [place] of Stay Meetings, dates discussed:    Additional Comments:  Maryclare Labrador, RN 02/02/2018, 11:03 AM

## 2018-02-02 NOTE — Telephone Encounter (Signed)
Copied from [place] [id number]. Topic: Quick Communication - See Telephone Encounter >> Feb 02, 2018  3:08 PM Blase Mess A wrote: CRM for notification. See Telephone encounter for: 02/02/18. Patient is calling to see if she can get CPE scheduled in December for Dr. Quay Burow for patient and his wife. No appts available in December back to back. Please advise [phone]

## 2018-02-02 NOTE — Telephone Encounter (Signed)
A same day will have to be removed to schedule, please help, husband and wife

## 2018-02-02 NOTE — Discharge Instructions (Signed)
Thoracotomy, Care After ° °This sheet gives you information about how to care for yourself after your procedure. Your doctor may also give you more specific instructions. If you have problems or questions, contact your doctor. °Follow these instructions at home: °Preventing lung infection (  °pneumonia) °· Take deep breaths or do breathing exercises as told by your doctor. °· Cough often. Coughing is important to clear thick spit (phlegm) and open your lungs. If coughing hurts, hold a pillow against your chest or place both hands flat on top of your cut (splinting) when you cough. This may help with discomfort. °· Use an incentive spirometer as told. This is a tool that measures how well you fill your lungs with each breath. °· Do lung therapy (pulmonary rehabilitation) as told. °Medicines °· Take over-the-counter or prescription medicines only as told by your doctor. °· If you have pain, take pain-relieving medicine before your pain gets very bad. This will help you breathe and cough more comfortably. °· If you were prescribed an antibiotic medicine, take it as told by your doctor. Do not stop taking the antibiotic even if you start to feel better. °Activity °· Ask your doctor what activities are safe for you. °· Do not travel by airplane for 2 weeks after your chest tube is removed, or until your doctor says that this is safe. °· Do not lift anything that is heavier than 10 lb (4.5 kg), or the limit that your doctor tells you, until he or she says that it is safe. °· Do not drive until your doctor approves. °¨ Do not drive or use heavy machinery while taking prescription pain medicine. °Incision care °· Follow instructions from your doctor about how to take care of your cut from surgery (incision). Make sure you: °¨ Wash your hands with soap and water before you change your bandage (dressing). If you cannot use soap and water, use hand sanitizer. °¨ Change your bandage as told by your doctor. °¨ Leave stitches  (sutures), skin glue, or skin tape (adhesive) strips in place. They may need to stay in place for 2 weeks or longer. If tape strips get loose and curl up, you may trim the loose edges. Do not remove tape strips completely unless your doctor says it is okay. °· Keep your bandage dry. °· Check your cut from surgery every day for signs of infection. Check for: °¨ More redness, swelling, or pain. °¨ More fluid or blood. °¨ Warmth. °¨ Pus or a bad smell. °Bathing °· Do not take baths, swim, or use a hot tub until your doctor approves. You may take showers. °· After your bandage has been removed, use soap and water to gently wash your cut from surgery. Do not use anything else to clean your cut unless your doctor tells you to. °Eating and drinking °· Eat a healthy diet as told by your doctor. A healthy diet includes: °¨ Fresh fruits and vegetables. °¨ Whole grains. °¨ Low-fat (lean) proteins. °· Drink enough fluid to keep your pee (urine) clear or pale yellow. °General instructions °· To prevent or treat trouble pooping (constipation) while you are taking prescription pain medicine, your doctor may recommend that you: °¨ Take over-the-counter or prescription medicines. °¨ Eat foods that are high in fiber. These include fresh fruits and vegetables, whole grains, and beans. °¨ Limit foods that are high in fat and processed sugars, such as fried and sweet foods. °· Do not use any products that contain nicotine or tobacco. These   include cigarettes and e-cigarettes. If you need help quitting, ask your doctor. °· Avoid secondhand smoke. °· Wear compression stockings as told. These help to prevent blood clots and reduce swelling in your legs. °· If you have a chest tube, care for it as told. °· Keep all follow-up visits as told by your doctor. This is important. °Contact a doctor if: °· You have more redness, swelling, or pain around your cut from surgery. °· You have more fluid or blood coming from your cut from  surgery. °· Your cut from surgery feels warm to the touch. °· You have pus or a bad smell coming from your cut from surgery. °· You have a fever or chills. °· Your heartbeat seems uneven. °· You feel sick to your stomach (nauseous). °· You throw up (vomit). °· You have muscle aches. °· You have trouble pooping (having a bowel movement). This may mean that you: °¨ Poop fewer times in a week than normal. °¨ Have a hard time pooping. °¨ Have poop that is dry, hard, or bigger than normal. °Get help right away if: °· You get a rash. °· You feel light-headed. °· You feel like you might pass out (faint). °· You are short of breath. °· You have trouble breathing. °· You are confused. °· You have trouble talking. °· You have problems with your seeing (vision). °· You are not able to move. °· You lose feeling (have numbness) in your: °¨ Face. °¨ Arms. °¨ Legs. °· You pass out. °· You have a sudden, bad headache. °· You feel weak. °· You have chest pain. °· You have pain that: °¨ Is very bad. °¨ Gets worse, even with medicine. °Summary °· Take deep breaths, do breathing exercises, and cough often. This helps prevent lung infection (pneumonia). °· Do not drive until your doctor approves. Do not travel by airplane for 2 weeks after your chest tube is removed, or until your doctor says that this is safe. °· Check your cut from surgery every day for signs of infection. °· Eat a healthy diet. This includes fresh fruits and vegetables, whole grains, and low-fat (lean) proteins. °This information is not intended to replace advice given to you by your health care provider. Make sure you discuss any questions you have with your health care provider. °Document Released: 09/06/2011 Document Revised: 11/30/2015 Document Reviewed: 11/30/2015 °Elsevier Interactive Patient Education © 2017 [employer] ° °

## 2018-02-06 ENCOUNTER — Encounter: Payer: Medicare Other | Admitting: Internal Medicine

## 2018-02-06 ENCOUNTER — Telehealth: Payer: Self-pay | Admitting: *Deleted

## 2018-02-06 NOTE — Telephone Encounter (Signed)
Pt was on the patient ping report. Patient was admitted on 01/30/2018 he was taken to the operating room where he underwent a [place] and dx w/ Ischemic cardiomyopathy with chronic combined systolic and diastolic congestive heart failure. Pt D/C 02/02/18, and will follow-up w/Dr. Cyndia Bent on 02/14/2018.Marland KitchenJohny Chess

## 2018-02-08 ENCOUNTER — Other Ambulatory Visit: Payer: Medicare Other

## 2018-02-12 ENCOUNTER — Ambulatory Visit: Payer: Medicare Other | Admitting: Endocrinology

## 2018-02-12 ENCOUNTER — Telehealth: Payer: Self-pay

## 2018-02-12 ENCOUNTER — Inpatient Hospital Stay (HOSPITAL_COMMUNITY): Admission: RE | Admit: 2018-02-12 | Payer: Medicare Other | Source: Ambulatory Visit

## 2018-02-12 NOTE — Telephone Encounter (Signed)
Returned a call to the wife and she stated that she has already spoken to [place] and they have worked something out as the pt cannot come out to any appts. Asked if she needed to schedule an appt as the pt does not have an appt and she stated he cannot get out of the house, therefore, she states she will call back.

## 2018-02-12 NOTE — Telephone Encounter (Signed)
Pt called stating that they had heart surgery and that they cannot make their scheduled appt time on 11/25 due to the surgery and wants to see what they need to do about getting their inr checked.

## 2018-02-13 ENCOUNTER — Other Ambulatory Visit: Payer: Self-pay | Admitting: Endocrinology

## 2018-02-13 ENCOUNTER — Other Ambulatory Visit: Payer: Self-pay | Admitting: Surgery

## 2018-02-13 ENCOUNTER — Telehealth (HOSPITAL_COMMUNITY): Payer: Self-pay | Admitting: Adult Health

## 2018-02-13 DIAGNOSIS — I7121 Aneurysm of the ascending aorta, without rupture: Secondary | ICD-10-CM

## 2018-02-13 DIAGNOSIS — I712 Thoracic aortic aneurysm, without rupture: Secondary | ICD-10-CM

## 2018-02-13 NOTE — Telephone Encounter (Signed)
Called and left message that cardiomems was elevated..   Left message to take 2.5 mg metolazone.   Elonna Mcfarlane  NP-C  3:25 PM

## 2018-02-14 ENCOUNTER — Ambulatory Visit (HOSPITAL_COMMUNITY)
Admission: RE | Admit: 2018-02-14 | Discharge: 2018-02-14 | Disposition: A | Discharge status: Home / Self Care | Payer: Medicare Other | Source: Ambulatory Visit | Attending: Cardiology | Admitting: Cardiology

## 2018-02-14 ENCOUNTER — Ambulatory Visit (INDEPENDENT_AMBULATORY_CARE_PROVIDER_SITE_OTHER): Payer: Medicare Other | Admitting: Cardiovascular Disease

## 2018-02-14 ENCOUNTER — Ambulatory Visit
Admission: RE | Admit: 2018-02-14 | Discharge: 2018-02-14 | Disposition: A | Discharge status: Home / Self Care | Payer: Medicare Other | Source: Ambulatory Visit | Attending: Surgery | Admitting: Surgery

## 2018-02-14 ENCOUNTER — Ambulatory Visit (INDEPENDENT_AMBULATORY_CARE_PROVIDER_SITE_OTHER): Payer: Self-pay | Admitting: Surgery

## 2018-02-14 ENCOUNTER — Encounter: Payer: Self-pay | Admitting: Surgery

## 2018-02-14 VITALS — BP 104/67 | HR 58 | Resp 20 | Ht 72.0 in | Wt 217.0 lb

## 2018-02-14 DIAGNOSIS — I712 Thoracic aortic aneurysm, without rupture, unspecified: Secondary | ICD-10-CM

## 2018-02-14 DIAGNOSIS — I429 Cardiomyopathy, unspecified: Secondary | ICD-10-CM

## 2018-02-14 DIAGNOSIS — Z09 Encounter for follow-up examination after completed treatment for conditions other than malignant neoplasm: Secondary | ICD-10-CM

## 2018-02-14 DIAGNOSIS — J90 Pleural effusion, not elsewhere classified: Secondary | ICD-10-CM | POA: Diagnosis not present

## 2018-02-14 DIAGNOSIS — I5022 Chronic systolic (congestive) heart failure: Secondary | ICD-10-CM

## 2018-02-14 DIAGNOSIS — Z7901 Long term (current) use of anticoagulants: Secondary | ICD-10-CM | POA: Diagnosis not present

## 2018-02-14 DIAGNOSIS — I4891 Unspecified atrial fibrillation: Secondary | ICD-10-CM | POA: Diagnosis not present

## 2018-02-14 DIAGNOSIS — Z951 Presence of aortocoronary bypass graft: Secondary | ICD-10-CM

## 2018-02-14 DIAGNOSIS — I7121 Aneurysm of the ascending aorta, without rupture: Secondary | ICD-10-CM

## 2018-02-14 LAB — PROTIME-INR
INR: 2.45
Prothrombin Time: 26.2 s — ABNORMAL HIGH (ref 11.4–15.2)

## 2018-02-14 NOTE — Progress Notes (Signed)
HPI: Patient returns for routine postoperative follow-up having undergone left anterior lateral minithoracotomy for attempted insertion of LV epicardial pacing leads on 01/30/2018.  Unfortunately I was not able to find a location on the posterior lateral wall of the left ventricle that had suitable sensing or pacing thresholds and therefore did not place the epicardial lead.  Since hospital discharge the patient reports that he has remained weak and short of breath.  He went home on oxygen because his sats were dropping into the low 80s with ambulation.  His main complaint today is of left chest wall pain.  He was called by the heart failure team today and told to take a dose of metolazone in addition to his regular medications due to excess volume detected by the cardiomems device.  Current Outpatient Medications  Medication Sig Dispense Refill  . ACCU-CHEK AVIVA PLUS test strip USE 1 STRIP TO CHECK GLUCOSE THREE TIMES DAILY AS DIRECTED 300 each 3  . ACCU-CHEK FASTCLIX LANCETS MISC USE 1 LANCET TO CHECK GLUCOSE THREE TIMES DAILY 102 each 5  . acetaminophen (TYLENOL) 325 MG tablet Take 650-975 mg by mouth 3 (three) times daily as needed for moderate pain.     Marland Kitchen allopurinol (ZYLOPRIM) 100 MG tablet TAKE TWO TABLETS BY MOUTH ONCE DAILY (Patient taking differently: Take 200 mg by mouth every evening. ) 180 tablet 3  . amLODipine (NORVASC) 5 MG tablet Take 1 tablet (5 mg total) by mouth daily. 30 tablet 6  . atorvastatin (LIPITOR) 10 MG tablet Take 1 tablet (10 mg total) by mouth daily. 90 tablet 2  . carboxymethylcellulose (REFRESH PLUS) 0.5 % SOLN Place 2 drops into both eyes daily.     . carvedilol (COREG) 25 MG tablet TAKE 1 TABLET BY MOUTH TWICE DAILY WITH A MEAL (Patient taking differently: Take 25 mg by mouth 2 (two) times daily. ) 60 tablet 11  . cholecalciferol (VITAMIN D) 1000 units tablet Take 1,000 Units by mouth daily.    . colchicine 0.6 MG tablet Take 0.6 mg by mouth daily as  needed (gout flares).     Marland Kitchen digoxin (LANOXIN) 0.125 MG tablet Take 0.5 tablets (0.0625 mg total) by mouth daily. (Patient taking differently: Take 0.125 mg by mouth daily. ) 15 tablet 6  . DULoxetine (CYMBALTA) 30 MG capsule TAKE 1 CAPSULE BY MOUTH ONCE DAILY 90 capsule 1  . gabapentin (NEURONTIN) 300 MG capsule Take 2 capsules (600 mg total) by mouth 3 (three) times daily. TAKE ONE CAPSULE BY MOUTH 5 TIMES DAILY AS NEEDED FOR LEG PAIN (Patient taking differently: Take 600-900 mg by mouth See admin instructions. Take 600 mg in the morning and 900 mg in the evening) 540 capsule 3  . insulin NPH Human (NOVOLIN N) 100 UNIT/ML injection Inject 6-8 Units into the skin See admin instructions. Inject 8 units subcutaneously in the morning and inject 6 units subcutaneously in the evening.    . insulin regular (NOVOLIN R) 100 units/mL injection Inject 6 Units into the skin daily before supper.     Marland Kitchen ipratropium (ATROVENT) 0.06 % nasal spray Place 2 sprays into both nostrils daily.     . methocarbamol (ROBAXIN) 750 MG tablet Take 1 tablet (750 mg total) by mouth 2 (two) times daily as needed for muscle spasms. 90 tablet 1  . metolazone (ZAROXOLYN) 2.5 MG tablet Take only as prescribed by CHF clinic. (Patient taking differently: Take 2.5 mg by mouth See admin instructions. Take 2.5 mg once if doctor directs  pt to take) 10 tablet 3  . metroNIDAZOLE (METROGEL) 0.75 % gel Apply 1 application topically daily as needed (rosacea).   2  . nitroGLYCERIN (NITROSTAT) 0.4 MG SL tablet Place 1 tablet (0.4 mg total) under the tongue every 5 (five) minutes as needed for chest pain (MAX 3 TABLETS). 25 tablet 3  . omeprazole (PRILOSEC) 20 MG capsule Take 2 capsules (40 mg total) by mouth 2 (two) times daily. (Patient taking differently: Take 40 mg by mouth daily. ) 120 capsule 11  . potassium chloride SA (K-DUR,KLOR-CON) 20 MEQ tablet Take 2 tablets (40 mEq total) by mouth daily. 90 tablet 3  . sacubitril-valsartan (ENTRESTO)  24-26 MG Take 1 tablet by mouth 2 (two) times daily. 60 tablet 11  . torsemide (DEMADEX) 100 MG tablet Take 1 tablet (100 mg total) by mouth 2 (two) times daily. 60 tablet 3  . warfarin (COUMADIN) 2.5 MG tablet TAKE 2 TABLETS (5 MG) BY MOUTH ON TUESDAYS AND SATURDAYS, THEN TAKE 1 TABLET (2.5 MG) BY MOUTH ON ALL OTHER DAYS. (Patient taking differently: TAKE 2 TABLETS (5 MG) BY MOUTH AT NIGHT ON TUESDAYS AND SATURDAYS, THEN TAKE 1 TABLET (2.5 MG) BY MOUTH ON ALL OTHER DAYS.) 120 tablet 1   No current facility-administered medications for this visit.     Physical Exam: BP 104/67   Pulse (!) 58   Resp 20   Ht 6' (1.829 m)   Wt 217 lb (98.4 kg)   SpO2 92% Comment: RA  BMI 29.43 kg/m  He looks well. Cardiac exam shows a regular rate and rhythm with normal heart sounds. Lung exam is clear. The left thoracotomy incision is healing well.  There is minimal swelling of the surrounding subcutaneous tissue. There is mild lower extremity edema bilaterally.   Diagnostic Tests:  CLINICAL DATA:  77 year old male status post attempted placement of LV epicardial pacing lead on 01/30/2018. Left chest pain and shortness of breath.  EXAM: CHEST - 2 VIEW  COMPARISON:  02/01/2018 and earlier.  FINDINGS: Stable right chest cardiac AICD. Stable cardiac size and mediastinal contours. Prior CABG. Superimposed midthoracic spinal stimulator device, partially visible cervical and lumbar fusion hardware.  Stable lung volumes and ventilation since 02/01/2018. Calcified pleural plaques, fibrothorax with small pleural effusions and left lower lobe round atelectasis demonstrated by CT earlier this year. No superimposed pneumothorax or pulmonary edema.  Stable visualized osseous structures. Negative visible bowel gas pattern.  IMPRESSION: Stable radiographic appearance of the chest since 02/01/2018.  Chronic lung disease with fibrothorax, small pleural effusions, and left lower lobe round  atelectasis.   Electronically Signed   By: Genevie Ann M.D.   On: 02/14/2018 10:56   Impression:  Overall I think he is making a satisfactory recovery following his surgery.  He still has some left chest wall pain that is limiting his deep breathing and probably leading to some atelectasis.  His oxygen saturations today on room air in the office are 92%.  I think you probably still benefit from some oxygen when he is ambulating but I do not think he needs to use it all the time.  He is going to buy a pulse oximeter so he can start tracking his oxygen saturation.  I suspect that as his pain improves he will take deeper breaths and his saturations and shortness of breath will improve.  He is taking Tylenol for pain.  He does have chronic systolic and diastolic heart failure is being managed by the heart failure team.  I  think he will feel better overall as he gets further out from surgery.  I discussed all this with him and his wife and I think they are relieved to hear that his postoperative course is going as I would expect.  He had some blood tests drawn by the heart failure team in our office today and they will follow-up with him concerning the results.  Plan:  I will see him back in the office in 1 month with a chest x-ray.   Gaye Pollack, MD [facility] [phone]

## 2018-02-19 ENCOUNTER — Other Ambulatory Visit (HOSPITAL_COMMUNITY): Payer: Self-pay | Admitting: Cardiology

## 2018-02-22 ENCOUNTER — Other Ambulatory Visit (HOSPITAL_COMMUNITY): Payer: Medicare Other

## 2018-02-22 ENCOUNTER — Encounter (HOSPITAL_COMMUNITY): Payer: Self-pay

## 2018-02-22 ENCOUNTER — Ambulatory Visit (HOSPITAL_BASED_OUTPATIENT_CLINIC_OR_DEPARTMENT_OTHER)
Admission: RE | Admit: 2018-02-22 | Discharge: 2018-02-22 | Disposition: A | Discharge status: Home / Self Care | Payer: Medicare Other | Source: Ambulatory Visit | Attending: Internal Medicine | Admitting: Internal Medicine

## 2018-02-22 ENCOUNTER — Other Ambulatory Visit (HOSPITAL_COMMUNITY): Payer: Self-pay

## 2018-02-22 VITALS — BP 110/71 | HR 57 | Wt 219.0 lb

## 2018-02-22 DIAGNOSIS — Z7901 Long term (current) use of anticoagulants: Secondary | ICD-10-CM

## 2018-02-22 DIAGNOSIS — Z951 Presence of aortocoronary bypass graft: Secondary | ICD-10-CM | POA: Diagnosis not present

## 2018-02-22 DIAGNOSIS — M109 Gout, unspecified: Secondary | ICD-10-CM

## 2018-02-22 DIAGNOSIS — N183 Chronic kidney disease, stage 3 (moderate): Secondary | ICD-10-CM | POA: Insufficient documentation

## 2018-02-22 DIAGNOSIS — Z794 Long term (current) use of insulin: Secondary | ICD-10-CM

## 2018-02-22 DIAGNOSIS — Z87891 Personal history of nicotine dependence: Secondary | ICD-10-CM

## 2018-02-22 DIAGNOSIS — I4821 Permanent atrial fibrillation: Secondary | ICD-10-CM | POA: Insufficient documentation

## 2018-02-22 DIAGNOSIS — E1122 Type 2 diabetes mellitus with diabetic chronic kidney disease: Secondary | ICD-10-CM | POA: Insufficient documentation

## 2018-02-22 DIAGNOSIS — I255 Ischemic cardiomyopathy: Secondary | ICD-10-CM

## 2018-02-22 DIAGNOSIS — Z880 Allergy status to penicillin: Secondary | ICD-10-CM | POA: Diagnosis not present

## 2018-02-22 DIAGNOSIS — I2582 Chronic total occlusion of coronary artery: Secondary | ICD-10-CM | POA: Diagnosis not present

## 2018-02-22 DIAGNOSIS — I712 Thoracic aortic aneurysm, without rupture: Secondary | ICD-10-CM | POA: Insufficient documentation

## 2018-02-22 DIAGNOSIS — I2510 Atherosclerotic heart disease of native coronary artery without angina pectoris: Secondary | ICD-10-CM | POA: Diagnosis not present

## 2018-02-22 DIAGNOSIS — Z950 Presence of cardiac pacemaker: Secondary | ICD-10-CM

## 2018-02-22 DIAGNOSIS — N179 Acute kidney failure, unspecified: Secondary | ICD-10-CM

## 2018-02-22 DIAGNOSIS — Z8249 Family history of ischemic heart disease and other diseases of the circulatory system: Secondary | ICD-10-CM

## 2018-02-22 DIAGNOSIS — Z881 Allergy status to other antibiotic agents status: Secondary | ICD-10-CM | POA: Diagnosis not present

## 2018-02-22 DIAGNOSIS — Z888 Allergy status to other drugs, medicaments and biological substances status: Secondary | ICD-10-CM | POA: Diagnosis not present

## 2018-02-22 DIAGNOSIS — Z833 Family history of diabetes mellitus: Secondary | ICD-10-CM | POA: Insufficient documentation

## 2018-02-22 DIAGNOSIS — R9431 Abnormal electrocardiogram [ECG] [EKG]: Secondary | ICD-10-CM

## 2018-02-22 DIAGNOSIS — I5023 Acute on chronic systolic (congestive) heart failure: Secondary | ICD-10-CM | POA: Diagnosis not present

## 2018-02-22 DIAGNOSIS — I130 Hypertensive heart and chronic kidney disease with heart failure and stage 1 through stage 4 chronic kidney disease, or unspecified chronic kidney disease: Secondary | ICD-10-CM | POA: Diagnosis not present

## 2018-02-22 DIAGNOSIS — I5022 Chronic systolic (congestive) heart failure: Secondary | ICD-10-CM | POA: Insufficient documentation

## 2018-02-22 DIAGNOSIS — Z79899 Other long term (current) drug therapy: Secondary | ICD-10-CM

## 2018-02-22 DIAGNOSIS — I447 Left bundle-branch block, unspecified: Secondary | ICD-10-CM | POA: Insufficient documentation

## 2018-02-22 DIAGNOSIS — E785 Hyperlipidemia, unspecified: Secondary | ICD-10-CM

## 2018-02-22 DIAGNOSIS — D45 Polycythemia vera: Secondary | ICD-10-CM | POA: Diagnosis not present

## 2018-02-22 LAB — PROTIME-INR
INR: 4.24
Prothrombin Time: 40.1 seconds — ABNORMAL HIGH (ref 11.4–15.2)

## 2018-02-22 LAB — BASIC METABOLIC PANEL
Anion gap: 13 (ref 5–15)
BUN: 52 mg/dL — ABNORMAL HIGH (ref 8–23)
CO2: 26 mmol/L (ref 22–32)
CREATININE: 2.87 mg/dL — AB (ref 0.61–1.24)
Calcium: 8.8 mg/dL — ABNORMAL LOW (ref 8.9–10.3)
Chloride: 100 mmol/L (ref 98–111)
GFR calc Af Amer: 24 mL/min — ABNORMAL LOW (ref >60)
GFR calc non Af Amer: 20 mL/min — ABNORMAL LOW (ref >60)
Glucose, Bld: 130 mg/dL — ABNORMAL HIGH (ref 70–99)
Potassium: 4.1 mmol/L (ref 3.5–5.1)
Sodium: 139 mmol/L (ref 135–145)

## 2018-02-22 LAB — CBC
HEMATOCRIT: 40.2 % (ref 39.0–52.0)
Hemoglobin: 12.8 g/dL — ABNORMAL LOW (ref 13.0–17.0)
MCH: 29.7 pg (ref 26.0–34.0)
MCHC: 31.8 g/dL (ref 30.0–36.0)
MCV: 93.3 fL (ref 80.0–100.0)
Platelets: 183 10*3/uL (ref 150–400)
RBC: 4.31 MIL/uL (ref 4.22–5.81)
RDW: 15.6 % — ABNORMAL HIGH (ref 11.5–15.5)
WBC: 6.7 10*3/uL (ref 4.0–10.5)
nRBC: 0 % (ref 0.0–0.2)

## 2018-02-22 LAB — BRAIN NATRIURETIC PEPTIDE: B Natriuretic Peptide: 2025.5 pg/mL — ABNORMAL HIGH (ref 0.0–100.0)

## 2018-02-22 LAB — DIGOXIN LEVEL: Digoxin Level: 0.7 ng/mL — ABNORMAL LOW (ref 0.8–2.0)

## 2018-02-22 MED ORDER — FUROSEMIDE 10 MG/ML IJ SOLN
80.0000 mg | Freq: Once | INTRAMUSCULAR | Status: AC
  Administered 2018-02-22: 80 mg via INTRAVENOUS
  Filled 2018-02-22 (×2): qty 8

## 2018-02-22 MED ORDER — POTASSIUM CHLORIDE CRYS ER 20 MEQ PO TBCR
40.0000 meq | EXTENDED_RELEASE_TABLET | Freq: Once | ORAL | Status: AC
  Administered 2018-02-22: 40 meq via ORAL
  Filled 2018-02-22 (×2): qty 2

## 2018-02-22 NOTE — Patient Instructions (Signed)
    [facility] [place] [id number] [place] [address] Dept: [phone] Loc: [phone]  JAVAD SALVA  02/22/2018  You are scheduled for a Cardiac Catheterization on Friday, December 5 with Dr. Loralie Champagne.  1. Please arrive at the [facility] (Main Entrance A) at [facility]: [address] [place],  [address] at 10:00 AM (This time is two hours before your procedure to ensure your preparation). Free valet parking service is available.   Special note: Every effort is made to have your procedure done on time. Please understand that emergencies sometimes delay scheduled procedures.  2. Diet: Do not eat solid foods after midnight.  The patient may have clear liquids until 5am upon the day of the procedure.  3. Labs: You had labs drawn today  4. Medication instructions in preparation for your procedure:   Contrast Allergy: No   Stop taking Coumadin (Warfarin) on Thursday, December 5. 2019  Stop taking, Torsemide (Demadex) Thursday, February 22, 2018  Stop taking Entresto, Potassium, and Digoxin Thursday February 22, 2018  Take only 3 units of insulin the night before your procedure. Do not take any insulin on the day of the procedure. (half your usual dose)   On the morning of your procedure, take any morning medicines NOT listed above.  You may use sips of water.  5. Plan for one night stay--bring personal belongings. 6. Bring a current list of your medications and current insurance cards. 7. You MUST have a responsible person to drive you home. 8. Someone MUST be with you the first 24 hours after you arrive home or your discharge will be delayed. 9. Please wear clothes that are easy to get on and off and wear slip-on shoes.  Thank you for allowing [place] to care for you!   -- [facility]

## 2018-02-22 NOTE — H&P (View-Only) (Signed)
PCP: Dr. Quay Burow Cardiologist: Dr Aundra Dubin   HPI: [name] is a 77 y.o. with history of CAD s/p CABG, permanent atrial fib, S/P AAA repair, ischemic cardiomyopathy/chronic systolic heart failure,  Medtronic CRT-D system.   Admitted 4/14 through 07/06/16 with volume overload. Diuresed with IV lasix and transitioned to torsemide 60 mg daily. HF meds adjusted and he was started on Entresto. Discharge weight was 220 pounds.  He now has a Cardiomems device. Torsemide has been increased to 80 mg bid based on Cardiomems evaluation.   Echo in 4/19 showed EF improved to 40-45%.  He stopped eplerenone due to breast pain which has resolved (same thing with spironolactone).   RHC was done in 7/19, PA pressure from [place] matched Cardiomems (there had been some question of Cardiomems accuracy).   Admitted 01/2018 for left thoracotomy and epicardial lead placement by [facility]. Unfortunately that was not successful. He was discharged on 02/02/2018. Creatinine at the time of discharge was 1.5.   Today he returns for an acute visit for increased edema and shortness of breath. Last week cardiomems was up to 27 and he was instructed to take 2.5 mg metolazone. Cardiomems reading went down to 22 but has been trending back up. Today he reports increased leg edema and and shortness of breath. Complaining of fatigue. Last night he had barbeque and has been drinking lots of fluids throughout the day. Denies PND/Orthopnea. Weight has gone up a few pounds. Complaining of leg fatigue.  No bleeding problems. Taking all medications.     Cardiomems Reading on 02/21/2018--> 34  ECG (10/19): Atrial fibrillation, LBBB-like IVCD 164 msec   Labs (5/18): K 4.7, creatinine 1.7, hgb 15.8, BNP 1001, LDL 71 Labs (8/18): K 4.3, creatinine 1.56, hgb 14.3 Labs (9/18): K 4, creatinine 1.79, digoxin 0.8 Labs (10/18): digoxin 0.3 Labs (11/18): K 4, creatinine 1.71 Labs (4/19): K 3.6, creatinine 1.45, LDL 52, HDL 42 Labs (5/19): K 4.1,  creatinine 1.71 Labs (8/19): K 4.2 => 3.9, creatinine 1.5 => 1.48, hgb 14.8, digoxin 0.5 Labs (10/19): hgb 14.1, digoxin 0.5, BNP 1274, K 3.4, creatinine 1.39 Labs (01/31/2018): K 3.7 Creatinine 1.5   PMH: 1. CAD: s/p CABG.  - LHC (12/17): totally occluded distal RCA, totally occluded mid LAD, totally occluded OM2 and OM3.  LIMA-LAD patent with 60% ostial stenosis.  SVG-OM3 and SVG-PDA were patent. Medical management.  2. Chronic systolic CHF: Ischemic cardiomyopathy.  He has a Medtronic CRT-D device but LV lead not turned on due to high threshold.  - Echo (12/17) with EF 35-40%, diffuse hypokinesis, normal RV size and systolic function, mild MR.  - CardioMems placed.  - Painful gynecomastia with spironolactone and eplerenone.  - Echo (4/19) with EF 40-45%, mild MR.  - RHC (7/19) with mean RA 9, PA 43/21 mean 30, mean PCWP 19, CI 2.15, PVR 2.3 WU (PA pressure matched Cardiomems).  - Echo (10/19): EF 35-40%, mild LVH, septal-lateral dyssynchrony, grade II diastolic dysfunction, moderate MR, mildly dilated RV with mildly decreased systolic function 3. AAA: s/p repair.  4. Atrial fibrillation: Permanent.  5. Gout 6. PAD: Saw Dr Scot Dock with [facility] in 4/18.  Noninvasive study concerning for infra-inguinal arterial occlusive disease bilaterally but appeared to have adequate circulation to heal toe ulcer.  7. CKD stage 3.  8. Ascending aortic aneurysm: CT chest 3/18 ascending aorta 4.9 cm, arch 4.4 cm.  - CTA chest (5/19): 5.2 cm ascending aorta.  9. Spinal stenosis 10. Polycythemia vera 11. Hyperlipidemia 12. Type II diabetes  ROS: All systems negative except as listed in HPI, PMH and Problem List.  Social History   Socioeconomic History  . Marital status: Married    Spouse name: Not on file  . Number of children: Not on file  . Years of education: Not on file  . Highest education level: Not on file  Occupational History  . Occupation: Retired- Nurse, mental health  Social Needs   . Financial resource strain: Not on file  . Food insecurity:    Worry: Not on file    Inability: Not on file  . Transportation needs:    Medical: Not on file    Non-medical: Not on file  Tobacco Use  . Smoking status: Former Smoker    Packs/day: 3.00    Years: 39.00    Pack years: 117.00    Types: Cigarettes    Start date: 05/24/1956    Last attempt to quit: 03/22/1995    Years since quitting: 22.9  . Smokeless tobacco: Never Used  Substance and Sexual Activity  . Alcohol use: Yes    Alcohol/week: 2.0 standard drinks    Types: 2 Cans of beer per week  . Drug use: No  . Sexual activity: Never  Lifestyle  . Physical activity:    Days per week: Not on file    Minutes per session: Not on file  . Stress: Not on file  Relationships  . Social connections:    Talks on phone: Not on file    Gets together: Not on file    Attends religious service: Not on file    Active member of club or organization: Not on file    Attends meetings of clubs or organizations: Not on file    Relationship status: Not on file  . Intimate partner violence:    Fear of current or ex partner: Not on file    Emotionally abused: Not on file    Physically abused: Not on file    Forced sexual activity: Not on file  Other Topics Concern  . Not on file  Social History Narrative  . Not on file    Family History  Problem Relation Age of Onset  . Hypertension Mother   . Stroke Mother   . Diabetes Father   . Coronary artery disease Father   . Other Father        DVT  . Diabetes Brother   . Colon cancer Neg Hx   . Heart attack Neg Hx      Current Outpatient Medications  Medication Sig Dispense Refill  . ACCU-CHEK AVIVA PLUS test strip USE 1 STRIP TO CHECK GLUCOSE THREE TIMES DAILY AS DIRECTED 300 each 3  . ACCU-CHEK FASTCLIX LANCETS MISC USE 1 LANCET TO CHECK GLUCOSE THREE TIMES DAILY 102 each 5  . acetaminophen (TYLENOL) 325 MG tablet Take 650-975 mg by mouth 3 (three) times daily as needed for  moderate pain.     Marland Kitchen allopurinol (ZYLOPRIM) 100 MG tablet TAKE TWO TABLETS BY MOUTH ONCE DAILY (Patient taking differently: Take 200 mg by mouth every evening. ) 180 tablet 3  . amLODipine (NORVASC) 5 MG tablet Take 1 tablet (5 mg total) by mouth daily. 30 tablet 6  . atorvastatin (LIPITOR) 10 MG tablet Take 1 tablet (10 mg total) by mouth daily. 90 tablet 2  . carboxymethylcellulose (REFRESH PLUS) 0.5 % SOLN Place 2 drops into both eyes daily.     . carvedilol (COREG) 25 MG tablet TAKE 1 TABLET BY MOUTH TWICE DAILY  WITH A MEAL (Patient taking differently: Take 25 mg by mouth 2 (two) times daily. ) 60 tablet 11  . cholecalciferol (VITAMIN D) 1000 units tablet Take 1,000 Units by mouth daily.    . colchicine 0.6 MG tablet Take 0.6 mg by mouth daily as needed (gout flares).     Marland Kitchen digoxin (LANOXIN) 0.125 MG tablet Take 0.5 tablets (0.0625 mg total) by mouth daily. (Patient taking differently: Take 0.125 mg by mouth daily. ) 15 tablet 6  . digoxin (LANOXIN) 0.125 MG tablet TAKE 1 TABLET BY MOUTH ONCE DAILY 30 tablet 5  . DULoxetine (CYMBALTA) 30 MG capsule TAKE 1 CAPSULE BY MOUTH ONCE DAILY 90 capsule 1  . gabapentin (NEURONTIN) 300 MG capsule Take 2 capsules (600 mg total) by mouth 3 (three) times daily. TAKE ONE CAPSULE BY MOUTH 5 TIMES DAILY AS NEEDED FOR LEG PAIN (Patient taking differently: Take 600-900 mg by mouth See admin instructions. Take 600 mg in the morning and 900 mg in the evening) 540 capsule 3  . insulin NPH Human (NOVOLIN N) 100 UNIT/ML injection Inject 6-8 Units into the skin See admin instructions. Inject 8 units subcutaneously in the morning and inject 6 units subcutaneously in the evening.    . insulin regular (NOVOLIN R) 100 units/mL injection Inject 6 Units into the skin daily before supper.     Marland Kitchen ipratropium (ATROVENT) 0.06 % nasal spray Place 2 sprays into both nostrils daily.     . methocarbamol (ROBAXIN) 750 MG tablet Take 1 tablet (750 mg total) by mouth 2 (two) times daily as  needed for muscle spasms. 90 tablet 1  . metolazone (ZAROXOLYN) 2.5 MG tablet Take only as prescribed by CHF clinic. (Patient taking differently: Take 2.5 mg by mouth See admin instructions. Take 2.5 mg once if doctor directs pt to take) 10 tablet 3  . metroNIDAZOLE (METROGEL) 0.75 % gel Apply 1 application topically daily as needed (rosacea).   2  . nitroGLYCERIN (NITROSTAT) 0.4 MG SL tablet Place 1 tablet (0.4 mg total) under the tongue every 5 (five) minutes as needed for chest pain (MAX 3 TABLETS). 25 tablet 3  . omeprazole (PRILOSEC) 20 MG capsule Take 2 capsules (40 mg total) by mouth 2 (two) times daily. (Patient taking differently: Take 40 mg by mouth daily. ) 120 capsule 11  . potassium chloride SA (K-DUR,KLOR-CON) 20 MEQ tablet Take 2 tablets (40 mEq total) by mouth daily. 90 tablet 3  . sacubitril-valsartan (ENTRESTO) 24-26 MG Take 1 tablet by mouth 2 (two) times daily. 60 tablet 11  . torsemide (DEMADEX) 100 MG tablet Take 1 tablet (100 mg total) by mouth 2 (two) times daily. 60 tablet 3  . warfarin (COUMADIN) 2.5 MG tablet TAKE 2 TABLETS (5 MG) BY MOUTH ON TUESDAYS AND SATURDAYS, THEN TAKE 1 TABLET (2.5 MG) BY MOUTH ON ALL OTHER DAYS. (Patient taking differently: TAKE 2 TABLETS (5 MG) BY MOUTH AT NIGHT ON TUESDAYS AND SATURDAYS, THEN TAKE 1 TABLET (2.5 MG) BY MOUTH ON ALL OTHER DAYS.) 120 tablet 1   Current Facility-Administered Medications  Medication Dose Route Frequency Provider Last Rate Last Dose  . furosemide (LASIX) injection 80 mg  80 mg Intravenous Once Clegg, Amy D, NP      . potassium chloride SA (K-DUR,KLOR-CON) CR tablet 40 mEq  40 mEq Oral Once Clegg, Amy D, NP        Vitals:   02/22/18 1151  BP: 110/71  Pulse: (!) 57  SpO2: (!) 87%  Weight: 99.3  kg (219 lb)   Wt Readings from Last 3 Encounters:  02/22/18 99.3 kg (219 lb)  02/14/18 98.4 kg (217 lb)  01/30/18 104.3 kg (230 lb)   PHYSICAL EXAM: General:  Arrived in a wheel chair.  No resp difficulty HEENT:  normal Neck: supple. 10-11. Carotids 2+ bilat; no bruits. No lymphadenopathy or thryomegaly appreciated. Cor: PMI nondisplaced. Regular rate & rhythm. No rubs, gallops or murmurs. L chest incision approximated.  Lungs: clear Abdomen: soft, nontender, distended. No hepatosplenomegaly. No bruits or masses. Good bowel sounds. Extremities: no cyanosis, clubbing, rash, edema Neuro: alert & orientedx3, cranial nerves grossly intact. moves all 4 extremities w/o difficulty. Affect pleasant   EKG: Sinus Loletha Grayer with PVCs. 57 bpm    ASSESSMENT & PLAN: 1. Chronic Systolic Heart Failure: Ischemic cardiomyopathy. 12/2017 Echo, EF 35-40%, stable. Medtronic CRT-D but LV lead turned off due to high threshold.  [id number] Unable to place epicardial lead. PADP 34 on cardiomems and has been trending up.  Given 80 mg IV lasix +_ 40 meq potassium in the clinic. - Continue torsemide 100 mg bid.   - Continue digoxin, recent level ok.   - Continue current Entresto, unable to increase due to significant lightheadedness on higher dose.  - Unable to take eplerenone or spironolactone due to breast tenderness.  This has mostly resolved off these meds.   - Continue Coreg 25 mg bid.  -2. Atrial fibrillation: Permanent.  Rate controlled.  Continue warfarin.   3. CAD: s/p CABG.  He has atypical chest pain.  - No ASA given warfarin use.  - Continue statin.  Good lipids 4/19.   - Would hold off on coronary angiography as echo today showed fairly stable systolic function.   4. CKD Stage III:  Check BMEt now.  5. Gout: stable. Continue allopurinol.  6. Ascending aortic aneurysm: 5/19 CT chest with 5.2 cm ascending aorta.  He saw Dr. Servando Snare.  Aneurysm has been relatively stable, would be high risk for operation.  - Keep SBP < 130 - Will get repeat CT chest w/o contrast in 11/19 => through Dr. Everrett Coombe office.     Given IV lasix in the clinic. I checked BMET and creatinine was up to 2.8. I discussed with Dr Aundra Dubin. Will  hold digoxin, torsemide, and entresto. He was set up for [place] tomorrow and he understands he will likely be admitted after cath.  He was offered hospitalization however he declined.   Hold coumadin for cath in am.  Greater than 50% of the (total minutes 60) visit spent in counseling/coordination of care regarding the above.   Amy Clegg NP--C 02/22/2018

## 2018-02-22 NOTE — Progress Notes (Signed)
PCP: Dr. Quay Burow Cardiologist: Dr Aundra Dubin   HPI: [name] is a 77 y.o. with history of CAD s/p CABG, permanent atrial fib, S/P AAA repair, ischemic cardiomyopathy/chronic systolic heart failure,  Medtronic CRT-D system.   Admitted 4/14 through 07/06/16 with volume overload. Diuresed with IV lasix and transitioned to torsemide 60 mg daily. HF meds adjusted and he was started on Entresto. Discharge weight was 220 pounds.  He now has a Cardiomems device. Torsemide has been increased to 80 mg bid based on Cardiomems evaluation.   Echo in 4/19 showed EF improved to 40-45%.  He stopped eplerenone due to breast pain which has resolved (same thing with spironolactone).   RHC was done in 7/19, PA pressure from [place] matched Cardiomems (there had been some question of Cardiomems accuracy).   Admitted 01/2018 for left thoracotomy and epicardial lead placement by [facility]. Unfortunately that was not successful. He was discharged on 02/02/2018. Creatinine at the time of discharge was 1.5.   Today he returns for an acute visit for increased edema and shortness of breath. Last week cardiomems was up to 27 and he was instructed to take 2.5 mg metolazone. Cardiomems reading went down to 22 but has been trending back up. Today he reports increased leg edema and and shortness of breath. Complaining of fatigue. Last night he had barbeque and has been drinking lots of fluids throughout the day. Denies PND/Orthopnea. Weight has gone up a few pounds. Complaining of leg fatigue.  No bleeding problems. Taking all medications.     Cardiomems Reading on 02/21/2018--> 34  ECG (10/19): Atrial fibrillation, LBBB-like IVCD 164 msec   Labs (5/18): K 4.7, creatinine 1.7, hgb 15.8, BNP 1001, LDL 71 Labs (8/18): K 4.3, creatinine 1.56, hgb 14.3 Labs (9/18): K 4, creatinine 1.79, digoxin 0.8 Labs (10/18): digoxin 0.3 Labs (11/18): K 4, creatinine 1.71 Labs (4/19): K 3.6, creatinine 1.45, LDL 52, HDL 42 Labs (5/19): K 4.1,  creatinine 1.71 Labs (8/19): K 4.2 => 3.9, creatinine 1.5 => 1.48, hgb 14.8, digoxin 0.5 Labs (10/19): hgb 14.1, digoxin 0.5, BNP 1274, K 3.4, creatinine 1.39 Labs (01/31/2018): K 3.7 Creatinine 1.5   PMH: 1. CAD: s/p CABG.  - LHC (12/17): totally occluded distal RCA, totally occluded mid LAD, totally occluded OM2 and OM3.  LIMA-LAD patent with 60% ostial stenosis.  SVG-OM3 and SVG-PDA were patent. Medical management.  2. Chronic systolic CHF: Ischemic cardiomyopathy.  He has a Medtronic CRT-D device but LV lead not turned on due to high threshold.  - Echo (12/17) with EF 35-40%, diffuse hypokinesis, normal RV size and systolic function, mild MR.  - CardioMems placed.  - Painful gynecomastia with spironolactone and eplerenone.  - Echo (4/19) with EF 40-45%, mild MR.  - RHC (7/19) with mean RA 9, PA 43/21 mean 30, mean PCWP 19, CI 2.15, PVR 2.3 WU (PA pressure matched Cardiomems).  - Echo (10/19): EF 35-40%, mild LVH, septal-lateral dyssynchrony, grade II diastolic dysfunction, moderate MR, mildly dilated RV with mildly decreased systolic function 3. AAA: s/p repair.  4. Atrial fibrillation: Permanent.  5. Gout 6. PAD: Saw Dr Scot Dock with [facility] in 4/18.  Noninvasive study concerning for infra-inguinal arterial occlusive disease bilaterally but appeared to have adequate circulation to heal toe ulcer.  7. CKD stage 3.  8. Ascending aortic aneurysm: CT chest 3/18 ascending aorta 4.9 cm, arch 4.4 cm.  - CTA chest (5/19): 5.2 cm ascending aorta.  9. Spinal stenosis 10. Polycythemia vera 11. Hyperlipidemia 12. Type II diabetes  ROS: All systems negative except as listed in HPI, PMH and Problem List.  Social History   Socioeconomic History  . Marital status: Married    Spouse name: Not on file  . Number of children: Not on file  . Years of education: Not on file  . Highest education level: Not on file  Occupational History  . Occupation: Retired- Nurse, mental health  Social Needs   . Financial resource strain: Not on file  . Food insecurity:    Worry: Not on file    Inability: Not on file  . Transportation needs:    Medical: Not on file    Non-medical: Not on file  Tobacco Use  . Smoking status: Former Smoker    Packs/day: 3.00    Years: 39.00    Pack years: 117.00    Types: Cigarettes    Start date: 05/24/1956    Last attempt to quit: 03/22/1995    Years since quitting: 22.9  . Smokeless tobacco: Never Used  Substance and Sexual Activity  . Alcohol use: Yes    Alcohol/week: 2.0 standard drinks    Types: 2 Cans of beer per week  . Drug use: No  . Sexual activity: Never  Lifestyle  . Physical activity:    Days per week: Not on file    Minutes per session: Not on file  . Stress: Not on file  Relationships  . Social connections:    Talks on phone: Not on file    Gets together: Not on file    Attends religious service: Not on file    Active member of club or organization: Not on file    Attends meetings of clubs or organizations: Not on file    Relationship status: Not on file  . Intimate partner violence:    Fear of current or ex partner: Not on file    Emotionally abused: Not on file    Physically abused: Not on file    Forced sexual activity: Not on file  Other Topics Concern  . Not on file  Social History Narrative  . Not on file    Family History  Problem Relation Age of Onset  . Hypertension Mother   . Stroke Mother   . Diabetes Father   . Coronary artery disease Father   . Other Father        DVT  . Diabetes Brother   . Colon cancer Neg Hx   . Heart attack Neg Hx      Current Outpatient Medications  Medication Sig Dispense Refill  . ACCU-CHEK AVIVA PLUS test strip USE 1 STRIP TO CHECK GLUCOSE THREE TIMES DAILY AS DIRECTED 300 each 3  . ACCU-CHEK FASTCLIX LANCETS MISC USE 1 LANCET TO CHECK GLUCOSE THREE TIMES DAILY 102 each 5  . acetaminophen (TYLENOL) 325 MG tablet Take 650-975 mg by mouth 3 (three) times daily as needed for  moderate pain.     Marland Kitchen allopurinol (ZYLOPRIM) 100 MG tablet TAKE TWO TABLETS BY MOUTH ONCE DAILY (Patient taking differently: Take 200 mg by mouth every evening. ) 180 tablet 3  . amLODipine (NORVASC) 5 MG tablet Take 1 tablet (5 mg total) by mouth daily. 30 tablet 6  . atorvastatin (LIPITOR) 10 MG tablet Take 1 tablet (10 mg total) by mouth daily. 90 tablet 2  . carboxymethylcellulose (REFRESH PLUS) 0.5 % SOLN Place 2 drops into both eyes daily.     . carvedilol (COREG) 25 MG tablet TAKE 1 TABLET BY MOUTH TWICE DAILY  WITH A MEAL (Patient taking differently: Take 25 mg by mouth 2 (two) times daily. ) 60 tablet 11  . cholecalciferol (VITAMIN D) 1000 units tablet Take 1,000 Units by mouth daily.    . colchicine 0.6 MG tablet Take 0.6 mg by mouth daily as needed (gout flares).     Marland Kitchen digoxin (LANOXIN) 0.125 MG tablet Take 0.5 tablets (0.0625 mg total) by mouth daily. (Patient taking differently: Take 0.125 mg by mouth daily. ) 15 tablet 6  . digoxin (LANOXIN) 0.125 MG tablet TAKE 1 TABLET BY MOUTH ONCE DAILY 30 tablet 5  . DULoxetine (CYMBALTA) 30 MG capsule TAKE 1 CAPSULE BY MOUTH ONCE DAILY 90 capsule 1  . gabapentin (NEURONTIN) 300 MG capsule Take 2 capsules (600 mg total) by mouth 3 (three) times daily. TAKE ONE CAPSULE BY MOUTH 5 TIMES DAILY AS NEEDED FOR LEG PAIN (Patient taking differently: Take 600-900 mg by mouth See admin instructions. Take 600 mg in the morning and 900 mg in the evening) 540 capsule 3  . insulin NPH Human (NOVOLIN N) 100 UNIT/ML injection Inject 6-8 Units into the skin See admin instructions. Inject 8 units subcutaneously in the morning and inject 6 units subcutaneously in the evening.    . insulin regular (NOVOLIN R) 100 units/mL injection Inject 6 Units into the skin daily before supper.     Marland Kitchen ipratropium (ATROVENT) 0.06 % nasal spray Place 2 sprays into both nostrils daily.     . methocarbamol (ROBAXIN) 750 MG tablet Take 1 tablet (750 mg total) by mouth 2 (two) times daily as  needed for muscle spasms. 90 tablet 1  . metolazone (ZAROXOLYN) 2.5 MG tablet Take only as prescribed by CHF clinic. (Patient taking differently: Take 2.5 mg by mouth See admin instructions. Take 2.5 mg once if doctor directs pt to take) 10 tablet 3  . metroNIDAZOLE (METROGEL) 0.75 % gel Apply 1 application topically daily as needed (rosacea).   2  . nitroGLYCERIN (NITROSTAT) 0.4 MG SL tablet Place 1 tablet (0.4 mg total) under the tongue every 5 (five) minutes as needed for chest pain (MAX 3 TABLETS). 25 tablet 3  . omeprazole (PRILOSEC) 20 MG capsule Take 2 capsules (40 mg total) by mouth 2 (two) times daily. (Patient taking differently: Take 40 mg by mouth daily. ) 120 capsule 11  . potassium chloride SA (K-DUR,KLOR-CON) 20 MEQ tablet Take 2 tablets (40 mEq total) by mouth daily. 90 tablet 3  . sacubitril-valsartan (ENTRESTO) 24-26 MG Take 1 tablet by mouth 2 (two) times daily. 60 tablet 11  . torsemide (DEMADEX) 100 MG tablet Take 1 tablet (100 mg total) by mouth 2 (two) times daily. 60 tablet 3  . warfarin (COUMADIN) 2.5 MG tablet TAKE 2 TABLETS (5 MG) BY MOUTH ON TUESDAYS AND SATURDAYS, THEN TAKE 1 TABLET (2.5 MG) BY MOUTH ON ALL OTHER DAYS. (Patient taking differently: TAKE 2 TABLETS (5 MG) BY MOUTH AT NIGHT ON TUESDAYS AND SATURDAYS, THEN TAKE 1 TABLET (2.5 MG) BY MOUTH ON ALL OTHER DAYS.) 120 tablet 1   Current Facility-Administered Medications  Medication Dose Route Frequency Provider Last Rate Last Dose  . furosemide (LASIX) injection 80 mg  80 mg Intravenous Once Clegg, Amy D, NP      . potassium chloride SA (K-DUR,KLOR-CON) CR tablet 40 mEq  40 mEq Oral Once Clegg, Amy D, NP        Vitals:   02/22/18 1151  BP: 110/71  Pulse: (!) 57  SpO2: (!) 87%  Weight: 99.3  kg (219 lb)   Wt Readings from Last 3 Encounters:  02/22/18 99.3 kg (219 lb)  02/14/18 98.4 kg (217 lb)  01/30/18 104.3 kg (230 lb)   PHYSICAL EXAM: General:  Arrived in a wheel chair.  No resp difficulty HEENT:  normal Neck: supple. 10-11. Carotids 2+ bilat; no bruits. No lymphadenopathy or thryomegaly appreciated. Cor: PMI nondisplaced. Regular rate & rhythm. No rubs, gallops or murmurs. L chest incision approximated.  Lungs: clear Abdomen: soft, nontender, distended. No hepatosplenomegaly. No bruits or masses. Good bowel sounds. Extremities: no cyanosis, clubbing, rash, edema Neuro: alert & orientedx3, cranial nerves grossly intact. moves all 4 extremities w/o difficulty. Affect pleasant   EKG: Sinus Loletha Grayer with PVCs. 57 bpm    ASSESSMENT & PLAN: 1. Chronic Systolic Heart Failure: Ischemic cardiomyopathy. 12/2017 Echo, EF 35-40%, stable. Medtronic CRT-D but LV lead turned off due to high threshold.  [id number] Unable to place epicardial lead. PADP 34 on cardiomems and has been trending up.  Given 80 mg IV lasix +_ 40 meq potassium in the clinic. - Continue torsemide 100 mg bid.   - Continue digoxin, recent level ok.   - Continue current Entresto, unable to increase due to significant lightheadedness on higher dose.  - Unable to take eplerenone or spironolactone due to breast tenderness.  This has mostly resolved off these meds.   - Continue Coreg 25 mg bid.  -2. Atrial fibrillation: Permanent.  Rate controlled.  Continue warfarin.   3. CAD: s/p CABG.  He has atypical chest pain.  - No ASA given warfarin use.  - Continue statin.  Good lipids 4/19.   - Would hold off on coronary angiography as echo today showed fairly stable systolic function.   4. CKD Stage III:  Check BMEt now.  5. Gout: stable. Continue allopurinol.  6. Ascending aortic aneurysm: 5/19 CT chest with 5.2 cm ascending aorta.  He saw Dr. Servando Snare.  Aneurysm has been relatively stable, would be high risk for operation.  - Keep SBP < 130 - Will get repeat CT chest w/o contrast in 11/19 => through Dr. Everrett Coombe office.     Given IV lasix in the clinic. I checked BMET and creatinine was up to 2.8. I discussed with Dr Aundra Dubin. Will  hold digoxin, torsemide, and entresto. He was set up for [place] tomorrow and he understands he will likely be admitted after cath.  He was offered hospitalization however he declined.   Hold coumadin for cath in am.  Greater than 50% of the (total minutes 60) visit spent in counseling/coordination of care regarding the above.   Amy Clegg NP--C 02/22/2018

## 2018-02-23 ENCOUNTER — Inpatient Hospital Stay (HOSPITAL_COMMUNITY): Payer: Medicare Other

## 2018-02-23 ENCOUNTER — Other Ambulatory Visit: Payer: Self-pay

## 2018-02-23 ENCOUNTER — Encounter (HOSPITAL_COMMUNITY): Payer: Self-pay | Admitting: General Practice

## 2018-02-23 ENCOUNTER — Inpatient Hospital Stay: Payer: Self-pay

## 2018-02-23 ENCOUNTER — Inpatient Hospital Stay (HOSPITAL_COMMUNITY)
Admission: RE | Admit: 2018-02-23 | Discharge: 2018-02-28 | DRG: 286 | Disposition: A | Discharge status: Home / Self Care | Payer: Medicare Other | Source: Ambulatory Visit | Attending: Cardiology | Admitting: Cardiology

## 2018-02-23 ENCOUNTER — Encounter (HOSPITAL_COMMUNITY)
Admission: RE | Disposition: A | Discharge status: Home / Self Care | Payer: Self-pay | Source: Ambulatory Visit | Attending: Cardiology

## 2018-02-23 DIAGNOSIS — Z7901 Long term (current) use of anticoagulants: Secondary | ICD-10-CM

## 2018-02-23 DIAGNOSIS — N183 Chronic kidney disease, stage 3 unspecified: Secondary | ICD-10-CM | POA: Diagnosis present

## 2018-02-23 DIAGNOSIS — I340 Nonrheumatic mitral (valve) insufficiency: Secondary | ICD-10-CM | POA: Diagnosis not present

## 2018-02-23 DIAGNOSIS — I2510 Atherosclerotic heart disease of native coronary artery without angina pectoris: Secondary | ICD-10-CM | POA: Diagnosis present

## 2018-02-23 DIAGNOSIS — I255 Ischemic cardiomyopathy: Secondary | ICD-10-CM | POA: Diagnosis present

## 2018-02-23 DIAGNOSIS — I130 Hypertensive heart and chronic kidney disease with heart failure and stage 1 through stage 4 chronic kidney disease, or unspecified chronic kidney disease: Principal | ICD-10-CM | POA: Diagnosis present

## 2018-02-23 DIAGNOSIS — I712 Thoracic aortic aneurysm, without rupture: Secondary | ICD-10-CM | POA: Diagnosis present

## 2018-02-23 DIAGNOSIS — I2582 Chronic total occlusion of coronary artery: Secondary | ICD-10-CM | POA: Diagnosis present

## 2018-02-23 DIAGNOSIS — I4821 Permanent atrial fibrillation: Secondary | ICD-10-CM | POA: Diagnosis present

## 2018-02-23 DIAGNOSIS — I5023 Acute on chronic systolic (congestive) heart failure: Secondary | ICD-10-CM | POA: Diagnosis present

## 2018-02-23 DIAGNOSIS — J90 Pleural effusion, not elsewhere classified: Secondary | ICD-10-CM | POA: Diagnosis not present

## 2018-02-23 DIAGNOSIS — Z880 Allergy status to penicillin: Secondary | ICD-10-CM | POA: Diagnosis not present

## 2018-02-23 DIAGNOSIS — I509 Heart failure, unspecified: Secondary | ICD-10-CM

## 2018-02-23 DIAGNOSIS — E785 Hyperlipidemia, unspecified: Secondary | ICD-10-CM | POA: Diagnosis present

## 2018-02-23 DIAGNOSIS — Z881 Allergy status to other antibiotic agents status: Secondary | ICD-10-CM

## 2018-02-23 DIAGNOSIS — D45 Polycythemia vera: Secondary | ICD-10-CM | POA: Diagnosis present

## 2018-02-23 DIAGNOSIS — Z951 Presence of aortocoronary bypass graft: Secondary | ICD-10-CM

## 2018-02-23 DIAGNOSIS — M109 Gout, unspecified: Secondary | ICD-10-CM | POA: Diagnosis present

## 2018-02-23 DIAGNOSIS — I482 Chronic atrial fibrillation, unspecified: Secondary | ICD-10-CM

## 2018-02-23 DIAGNOSIS — Z452 Encounter for adjustment and management of vascular access device: Secondary | ICD-10-CM | POA: Diagnosis not present

## 2018-02-23 DIAGNOSIS — I5022 Chronic systolic (congestive) heart failure: Secondary | ICD-10-CM

## 2018-02-23 DIAGNOSIS — Z87891 Personal history of nicotine dependence: Secondary | ICD-10-CM

## 2018-02-23 DIAGNOSIS — I5021 Acute systolic (congestive) heart failure: Secondary | ICD-10-CM | POA: Diagnosis present

## 2018-02-23 DIAGNOSIS — J811 Chronic pulmonary edema: Secondary | ICD-10-CM | POA: Diagnosis not present

## 2018-02-23 DIAGNOSIS — E1122 Type 2 diabetes mellitus with diabetic chronic kidney disease: Secondary | ICD-10-CM | POA: Diagnosis present

## 2018-02-23 DIAGNOSIS — I370 Nonrheumatic pulmonary valve stenosis: Secondary | ICD-10-CM | POA: Diagnosis not present

## 2018-02-23 DIAGNOSIS — Z79899 Other long term (current) drug therapy: Secondary | ICD-10-CM

## 2018-02-23 DIAGNOSIS — Z794 Long term (current) use of insulin: Secondary | ICD-10-CM | POA: Diagnosis not present

## 2018-02-23 DIAGNOSIS — N179 Acute kidney failure, unspecified: Secondary | ICD-10-CM | POA: Diagnosis not present

## 2018-02-23 DIAGNOSIS — Z888 Allergy status to other drugs, medicaments and biological substances status: Secondary | ICD-10-CM | POA: Diagnosis not present

## 2018-02-23 HISTORY — PX: RIGHT HEART CATH: CATH118263

## 2018-02-23 HISTORY — DX: Dyspnea, unspecified: R06.00

## 2018-02-23 HISTORY — PX: CARDIAC CATHETERIZATION: SHX172

## 2018-02-23 LAB — CBC
HCT: 41.3 % (ref 39.0–52.0)
Hemoglobin: 12.8 g/dL — ABNORMAL LOW (ref 13.0–17.0)
MCH: 28.8 pg (ref 26.0–34.0)
MCHC: 31 g/dL (ref 30.0–36.0)
MCV: 93 fL (ref 80.0–100.0)
Platelets: 220 10*3/uL (ref 150–400)
RBC: 4.44 MIL/uL (ref 4.22–5.81)
RDW: 15.6 % — ABNORMAL HIGH (ref 11.5–15.5)
WBC: 5.6 10*3/uL (ref 4.0–10.5)
nRBC: 0 % (ref 0.0–0.2)

## 2018-02-23 LAB — POCT I-STAT 3, VENOUS BLOOD GAS (G3P V)
Acid-Base Excess: 1 mmol/L (ref 0.0–2.0)
Acid-Base Excess: 2 mmol/L (ref 0.0–2.0)
BICARBONATE: 26.1 mmol/L (ref 20.0–28.0)
Bicarbonate: 27.3 mmol/L (ref 20.0–28.0)
O2 Saturation: 54 %
O2 Saturation: 55 %
PO2 VEN: 29 mmHg — AB (ref 32.0–45.0)
PO2 VEN: 29 mmHg — AB (ref 32.0–45.0)
TCO2: 27 mmol/L (ref 22–32)
TCO2: 29 mmol/L (ref 22–32)
pCO2, Ven: 43 mmHg — ABNORMAL LOW (ref 44.0–60.0)
pCO2, Ven: 44.6 mmHg (ref 44.0–60.0)
pH, Ven: 7.391 (ref 7.250–7.430)
pH, Ven: 7.395 (ref 7.250–7.430)

## 2018-02-23 LAB — PROTIME-INR
INR: 3.95
INR: 3.64
PROTHROMBIN TIME: 38 s — AB (ref 11.4–15.2)
Prothrombin Time: 35.7 seconds — ABNORMAL HIGH (ref 11.4–15.2)

## 2018-02-23 LAB — BASIC METABOLIC PANEL
Anion gap: 13 (ref 5–15)
BUN: 48 mg/dL — ABNORMAL HIGH (ref 8–23)
CHLORIDE: 102 mmol/L (ref 98–111)
CO2: 23 mmol/L (ref 22–32)
Calcium: 8.7 mg/dL — ABNORMAL LOW (ref 8.9–10.3)
Creatinine, Ser: 2.72 mg/dL — ABNORMAL HIGH (ref 0.61–1.24)
GFR calc Af Amer: 25 mL/min — ABNORMAL LOW (ref >60)
GFR calc non Af Amer: 22 mL/min — ABNORMAL LOW (ref >60)
Glucose, Bld: 129 mg/dL — ABNORMAL HIGH (ref 70–99)
Potassium: 4.4 mmol/L (ref 3.5–5.1)
Sodium: 138 mmol/L (ref 135–145)

## 2018-02-23 LAB — GLUCOSE, CAPILLARY
Glucose-Capillary: 112 mg/dL — ABNORMAL HIGH (ref 70–99)
Glucose-Capillary: 104 mg/dL — ABNORMAL HIGH (ref 70–99)
Glucose-Capillary: 106 mg/dL — ABNORMAL HIGH (ref 70–99)
Glucose-Capillary: 132 mg/dL — ABNORMAL HIGH (ref 70–99)

## 2018-02-23 LAB — COOXEMETRY PANEL
Carboxyhemoglobin: 1.3 % (ref 0.5–1.5)
Methemoglobin: 1.6 % — ABNORMAL HIGH (ref 0.0–1.5)
O2 Saturation: 63.2 %
Total hemoglobin: 13.3 g/dL (ref 12.0–16.0)

## 2018-02-23 LAB — MRSA PCR SCREENING: MRSA by PCR: NEGATIVE

## 2018-02-23 SURGERY — RIGHT HEART CATH
Anesthesia: LOCAL

## 2018-02-23 MED ORDER — ACETAMINOPHEN 325 MG PO TABS: 650.0000 mg | ORAL_TABLET | ORAL | Status: DC | PRN

## 2018-02-23 MED ORDER — CARBOXYMETHYLCELLULOSE SODIUM 0.5 % OP SOLN: 2.0000 [drp] | Freq: Every day | OPHTHALMIC | Status: DC

## 2018-02-23 MED ORDER — SODIUM CHLORIDE 0.9% FLUSH: 3.0000 mL | Freq: Two times a day (BID) | INTRAVENOUS | Status: DC

## 2018-02-23 MED ORDER — SODIUM CHLORIDE 0.9% FLUSH: 3.0000 mL | INTRAVENOUS | Status: DC | PRN

## 2018-02-23 MED ORDER — PANTOPRAZOLE SODIUM 40 MG PO TBEC
80.0000 mg | DELAYED_RELEASE_TABLET | Freq: Every day | ORAL | Status: DC
  Administered 2018-02-23: 40 mg via ORAL
  Administered 2018-02-24 – 2018-02-28 (×5): 80 mg via ORAL
  Filled 2018-02-23 (×6): qty 2

## 2018-02-23 MED ORDER — FUROSEMIDE 10 MG/ML IJ SOLN
INTRAMUSCULAR | Status: AC
  Filled 2018-02-23: qty 8

## 2018-02-23 MED ORDER — INSULIN ASPART 100 UNIT/ML ~~LOC~~ SOLN: 0.0000 [IU] | Freq: Every day | SUBCUTANEOUS | Status: DC

## 2018-02-23 MED ORDER — ONDANSETRON HCL 4 MG/2ML IJ SOLN: 4.0000 mg | Freq: Four times a day (QID) | INTRAMUSCULAR | Status: DC | PRN

## 2018-02-23 MED ORDER — CARVEDILOL 6.25 MG PO TABS
6.2500 mg | ORAL_TABLET | Freq: Two times a day (BID) | ORAL | Status: DC
  Administered 2018-02-23 – 2018-02-28 (×10): 6.25 mg via ORAL
  Filled 2018-02-23 (×10): qty 1

## 2018-02-23 MED ORDER — ACETAMINOPHEN 325 MG PO TABS
650.0000 mg | ORAL_TABLET | ORAL | Status: DC | PRN
  Administered 2018-02-23 – 2018-02-28 (×5): 650 mg via ORAL
  Filled 2018-02-23 (×5): qty 2

## 2018-02-23 MED ORDER — SODIUM CHLORIDE 0.9 % IV SOLN: 250.0000 mL | INTRAVENOUS | Status: DC | PRN

## 2018-02-23 MED ORDER — POTASSIUM CHLORIDE CRYS ER 20 MEQ PO TBCR
40.0000 meq | EXTENDED_RELEASE_TABLET | Freq: Every day | ORAL | Status: DC
  Administered 2018-02-23 – 2018-02-28 (×6): 40 meq via ORAL
  Filled 2018-02-23 (×7): qty 2

## 2018-02-23 MED ORDER — INSULIN ASPART 100 UNIT/ML ~~LOC~~ SOLN
0.0000 [IU] | Freq: Three times a day (TID) | SUBCUTANEOUS | Status: DC
  Administered 2018-02-24 – 2018-02-27 (×7): 1 [IU] via SUBCUTANEOUS

## 2018-02-23 MED ORDER — INSULIN NPH (HUMAN) (ISOPHANE) 100 UNIT/ML ~~LOC~~ SUSP
8.0000 [IU] | Freq: Every day | SUBCUTANEOUS | Status: DC
  Administered 2018-02-24 – 2018-02-28 (×5): 8 [IU] via SUBCUTANEOUS

## 2018-02-23 MED ORDER — ATORVASTATIN CALCIUM 10 MG PO TABS
10.0000 mg | ORAL_TABLET | Freq: Every day | ORAL | Status: DC
  Administered 2018-02-23 – 2018-02-27 (×5): 10 mg via ORAL
  Filled 2018-02-23 (×5): qty 1

## 2018-02-23 MED ORDER — ASPIRIN 81 MG PO CHEW
CHEWABLE_TABLET | ORAL | Status: AC
  Administered 2018-02-23: 81 mg via ORAL
  Filled 2018-02-23: qty 1

## 2018-02-23 MED ORDER — FUROSEMIDE 10 MG/ML IJ SOLN
80.0000 mg | Freq: Two times a day (BID) | INTRAMUSCULAR | Status: DC
  Administered 2018-02-23: 80 mg via INTRAVENOUS
  Filled 2018-02-23: qty 8

## 2018-02-23 MED ORDER — DULOXETINE HCL 30 MG PO CPEP
30.0000 mg | ORAL_CAPSULE | Freq: Every day | ORAL | Status: DC
  Administered 2018-02-24 – 2018-02-28 (×5): 30 mg via ORAL
  Filled 2018-02-23 (×5): qty 1

## 2018-02-23 MED ORDER — MILRINONE LACTATE IN DEXTROSE 20-5 MG/100ML-% IV SOLN
0.1250 ug/kg/min | INTRAVENOUS | Status: DC
  Administered 2018-02-23 – 2018-02-27 (×5): 0.25 ug/kg/min via INTRAVENOUS
  Filled 2018-02-23 (×7): qty 100

## 2018-02-23 MED ORDER — METHOCARBAMOL 750 MG PO TABS: 750.0000 mg | ORAL_TABLET | Freq: Two times a day (BID) | ORAL | Status: DC | PRN

## 2018-02-23 MED ORDER — POLYVINYL ALCOHOL 1.4 % OP SOLN
2.0000 [drp] | Freq: Every day | OPHTHALMIC | Status: DC
  Administered 2018-02-24 – 2018-02-28 (×5): 2 [drp] via OPHTHALMIC
  Filled 2018-02-23: qty 15

## 2018-02-23 MED ORDER — SODIUM CHLORIDE 0.9 % IV SOLN
INTRAVENOUS | Status: DC
  Administered 2018-02-23: 12:00:00 via INTRAVENOUS

## 2018-02-23 MED ORDER — VITAMIN D 25 MCG (1000 UNIT) PO TABS
1000.0000 [IU] | ORAL_TABLET | Freq: Every day | ORAL | Status: DC
  Administered 2018-02-24 – 2018-02-28 (×5): 1000 [IU] via ORAL
  Filled 2018-02-23 (×9): qty 1

## 2018-02-23 MED ORDER — INSULIN NPH (HUMAN) (ISOPHANE) 100 UNIT/ML ~~LOC~~ SUSP
6.0000 [IU] | Freq: Every day | SUBCUTANEOUS | Status: DC
  Administered 2018-02-23 – 2018-02-27 (×5): 6 [IU] via SUBCUTANEOUS
  Filled 2018-02-23: qty 10

## 2018-02-23 MED ORDER — SODIUM CHLORIDE 0.9% FLUSH
3.0000 mL | Freq: Two times a day (BID) | INTRAVENOUS | Status: DC
  Administered 2018-02-23 – 2018-02-25 (×2): 3 mL via INTRAVENOUS

## 2018-02-23 MED ORDER — ALLOPURINOL 100 MG PO TABS
200.0000 mg | ORAL_TABLET | Freq: Every evening | ORAL | Status: DC
  Administered 2018-02-23 – 2018-02-27 (×5): 200 mg via ORAL
  Filled 2018-02-23 (×7): qty 2

## 2018-02-23 MED ORDER — INSULIN NPH (HUMAN) (ISOPHANE) 100 UNIT/ML ~~LOC~~ SUSP: 6.0000 [IU] | SUBCUTANEOUS | Status: DC

## 2018-02-23 MED ORDER — GABAPENTIN 300 MG PO CAPS
600.0000 mg | ORAL_CAPSULE | Freq: Three times a day (TID) | ORAL | Status: DC
  Administered 2018-02-23 – 2018-02-28 (×15): 600 mg via ORAL
  Filled 2018-02-23 (×16): qty 2

## 2018-02-23 MED ORDER — HEPARIN (PORCINE) IN NACL 1000-0.9 UT/500ML-% IV SOLN
INTRAVENOUS | Status: AC
  Filled 2018-02-23: qty 500

## 2018-02-23 MED ORDER — LIDOCAINE HCL (PF) 1 % IJ SOLN
INTRAMUSCULAR | Status: AC
  Filled 2018-02-23: qty 30

## 2018-02-23 MED ORDER — LIDOCAINE HCL (PF) 1 % IJ SOLN
INTRAMUSCULAR | Status: DC | PRN
  Administered 2018-02-23: 2 mL

## 2018-02-23 MED ORDER — HEPARIN (PORCINE) IN NACL 1000-0.9 UT/500ML-% IV SOLN
INTRAVENOUS | Status: DC | PRN
  Administered 2018-02-23: 500 mL

## 2018-02-23 MED ORDER — MILRINONE LACTATE IN DEXTROSE 20-5 MG/100ML-% IV SOLN
0.2500 ug/kg/min | INTRAVENOUS | Status: DC
  Administered 2018-02-23: 0.25 ug/kg/min via INTRAVENOUS

## 2018-02-23 MED ORDER — IPRATROPIUM BROMIDE 0.06 % NA SOLN
2.0000 | Freq: Every day | NASAL | Status: DC
  Administered 2018-02-24 – 2018-02-28 (×5): 2 via NASAL
  Filled 2018-02-23: qty 15

## 2018-02-23 MED ORDER — AMLODIPINE BESYLATE 5 MG PO TABS
5.0000 mg | ORAL_TABLET | Freq: Every day | ORAL | Status: DC
Start: 2018-02-23 — End: 2018-02-25
  Administered 2018-02-24: 5 mg via ORAL
  Filled 2018-02-23: qty 1

## 2018-02-23 MED ORDER — ASPIRIN 81 MG PO CHEW
81.0000 mg | CHEWABLE_TABLET | ORAL | Status: AC
  Administered 2018-02-23: 81 mg via ORAL

## 2018-02-23 SURGICAL SUPPLY — 6 items
CATH BALLN WEDGE 5F 110CM (CATHETERS) ×1 IMPLANT
GUIDEWIRE .025 260CM (WIRE) ×1 IMPLANT
KIT HEART RIGHT NAMIC (KITS) ×1 IMPLANT
PACK CARDIAC CATHETERIZATION (CUSTOM PROCEDURE TRAY) ×2 IMPLANT
SHEATH GLIDE SLENDER 4/5FR (SHEATH) ×1 IMPLANT
TRANSDUCER W/STOPCOCK (MISCELLANEOUS) ×2 IMPLANT

## 2018-02-23 NOTE — Progress Notes (Signed)
Peripherally Inserted Central Catheter/Midline Placement  The IV Nurse has discussed with the patient and/or persons authorized to consent for the patient, the purpose of this procedure and the potential benefits and risks involved with this procedure.  The benefits include less needle sticks, lab draws from the catheter, and the patient may be discharged home with the catheter. Risks include, but not limited to, infection, bleeding, blood clot (thrombus formation), and puncture of an artery; nerve damage and irregular heartbeat and possibility to perform a PICC exchange if needed/ordered by physician.  Alternatives to this procedure were also discussed.  Bard Power PICC patient education guide, fact sheet on infection prevention and patient information card has been provided to patient /or left at bedside.    PICC/Midline Placement Documentation  PICC Double Lumen 03/29/30 PICC Left Basilic 50 cm 0 cm (Active)  Indication for Insertion or Continuance of Line Vasoactive infusions 02/23/2018  5:00 PM  Exposed Catheter (cm) 0 cm 02/23/2018  5:00 PM  Site Assessment Clean;Dry;Intact 02/23/2018  5:00 PM  Lumen #1 Status Flushed;Saline locked;Blood return noted 02/23/2018  5:00 PM  Lumen #2 Status Flushed;Saline locked;Blood return noted 02/23/2018  5:00 PM  Dressing Type Transparent 02/23/2018  5:00 PM  Dressing Status Clean;Dry;Intact 02/23/2018  5:00 PM  Dressing Intervention New dressing 02/23/2018  5:00 PM  Dressing Change Due 03/02/18 02/23/2018  5:00 PM       Ryen Rhames, Nicolette Bang 02/23/2018, 5:02 PM

## 2018-02-23 NOTE — Progress Notes (Signed)
ANTICOAGULATION CONSULT NOTE - Initial Consult  Pharmacy Consult for warfarin Indication: atrial fibrillation  Allergies  Allergen Reactions  . Avelox [Moxifloxacin Hcl In Nacl] Swelling, Rash and Other (See Comments)    Patient became hypotensive after infusion started Because of a history of documented adverse serious drug reaction;Medi Alert bracelet  is recommended  . Other Rash and Swelling    Patient became hypotensive after infusion started Because of a history of documented adverse serious drug reaction;Medi Alert braceletis recommended  . Penicillins Anaphylaxis, Swelling and Other (See Comments)    Because of a history of documented adverse serious drug reaction;Medi Alert bracelet  is recommended Has patient had a PCN reaction causing immediate rash, facial/tongue/throat swelling, SOB or lightheadedness with hypotension: Yes Has patient had a PCN reaction causing severe rash involving mucus membranes or skin necrosis: No Has patient had a PCN reaction that required hospitalization: Unknown Has patient had a PCN reaction occurring within the last 10 years: No    Patient Measurements: Height: 6' (182.9 cm) Weight: 215 lb (97.5 kg) IBW/kg (Calculated) : 77.6  Vital Signs: Temp: 97.4 F (36.3 C) (12/06 1049) Temp Source: Oral (12/06 1049) BP: 134/76 (12/06 1305) Pulse Rate: 81 (12/06 1310)  Labs: Recent Labs    02/22/18 1214 02/22/18 1417 02/23/18 1044  HGB  --  12.8*  --   HCT  --  40.2  --   PLT  --  183  --   LABPROT  --  40.1* 38.0*  INR  --  4.24* 3.95  CREATININE 2.87*  --   --     Estimated Creatinine Clearance: 26.5 mL/min (A) (by C-G formula based on SCr of 2.87 mg/dL (H)).   Medical History: Past Medical History:  Diagnosis Date  . AAA (abdominal aortic aneurysm) ([place])    Surgical repair 11/2002.  . Adenomatous colon polyp   . AICD (automatic cardioverter/defibrillator) present   . Alcohol ingestion of more than four drinks per week     Excess beer  not a dependency problem  . Aortic valve sclerosis   . Arthritis   . Atrial fibrillation ([place])    Previous long-term amiodarone therapy with multiple cardioversions / amiodarone stopped September, 2009  . Atrial flutter [facility])    Started November, 2010, Left-sided and cannot ablate  . Bony abnormality    Patient's manubrium is slightly displaced to the right  . CAD (coronary artery disease)    a. s/p CABG 2004;  b. 02/2016 Cath: LM nl, LAD 70p, 158m, D1 50, D2 50, RI 50ost, LCX 28m, OM2 100, OM3 100, RCA 70p, 100d, VG->OM3 ok, LIMA->LAD 60ost, VG->RPDA  ok.  . 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 01/2013 MDT DTBA 1D1 [place] CRT-D, ser # [id number].  . Carotid artery disease ([place])    Doppler, December, 2013, 0-39% bilateral  . Chronic systolic CHF (congestive heart failure) ([place])    a. 02/2016 Echo: EF 35-40%, diff HK, triv AI, mildly dil Ao root 43mm, mild MR, sev dil LA, triv TR.  Marland Kitchen Chronotropic incompetence    IV pacing rate adjusted  . CKD (chronic kidney disease), stage III ([place])   . COPD (chronic obstructive pulmonary disease) ([place])   . Dilated aortic root ([place])   . Discolored skin   . Diverticulosis   . Drug therapy    Redness and swelling with Avelox infusion May 24, 2011  . Eye abnormality    Ophthalmologist questions a clot in one of his  eyes, May, 2012  . Gout   . Hyperlipidemia   . Hypertension   . Internal hemorrhoids   . Ischemic cardiomyopathy   . Left atrial thrombus    Remote past... cardioversions done since that time  . Mitral regurgitation    Mild echo  . Myocardial infarction ([place])   . Nasal drainage    Chronic  . Overweight(278.02)   . Pericardial effusion   . Pleural effusion    Large loculated effusion on the left side November, 2011. This was tapped. It was exudative. Cytology revealed no cancer no proof of mesothelioma area pulmonary team felt that no further workup was needed  . Pleural thickening    . Pneumonia   . Polycythemia vera ([place]) 07/28/2014  . Presence of permanent cardiac pacemaker   . SOB (shortness of breath)    Large left effusion/ thoracentesis/hospitalization/November, 2011... exudated.. cytology negative.. Dr.Wert.. no proof of mesothelioma  . Spinal stenosis    Surgery Dr.Elsner  . Thrombophlebitis of superficial veins of upper extremities    Possible venous stenosis from defibrillator  . Type II diabetes mellitus ([place]) 2008  . Venous insufficiency    Toe discoloration chronic  . Ventral hernia    April, 2014, result of his abdominal surgery  . Warfarin anticoagulation   . Wide-complex tachycardia ([place])      Assessment: 60 yoM admitted for RHC found to have volume overload and low output. Pt on warfarin PTA for hx of PAF with last dose 12/4 (pt advised to hold on 12/5 after clinic visit). INR on admit 3.95, home regimen is 2.5mg  daily except 5mg  Tuesday.  Goal of Therapy:  INR 2-3 Monitor platelets by anticoagulation protocol: Yes   Plan:  -Hold warfarin tonight -Check protime tomorrow  Arrie Senate, PharmD, BCPS Clinical Pharmacist [phone] Please check AMION for all [facility] Pharmacy numbers 02/23/2018

## 2018-02-23 NOTE — Progress Notes (Signed)
Spoke with Ebony Hail, RN regarding PICC order.  Patient has AICD on right side and per Ebony Hail left side is restricted due to catheterization via that arm on 12/6.  At this time PICC cannot be placed due to restrictions.  Ebony Hail to speak with MD and see if left arm can be used 12/7 and PICC will be placed then.  Carolee Rota, RN VAST

## 2018-02-23 NOTE — Interval H&P Note (Signed)
History and Physical Interval Note:  02/23/2018 12:45 PM  [name]  has presented today for surgery, with the diagnosis of chf  The various methods of treatment have been discussed with the patient and family. After consideration of risks, benefits and other options for treatment, the patient has consented to  Procedure(s): RIGHT HEART CATH (N/A) as a surgical intervention .  The patient's history has been reviewed, patient examined, no change in status, stable for surgery.  I have reviewed the patient's chart and labs.  Questions were answered to the patient's satisfaction.     Homer Pfeifer [employer]

## 2018-02-23 NOTE — H&P (Addendum)
Advanced Heart Failure H&P Note    PCP: Dr. Quay Burow Cardiologist: Dr Aundra Dubin   Reason for admission: Acute on chronic systolic CHF.   HPI: [name] is a 77 y.o. with history of CAD s/p CABG, permanent atrial fib, S/P AAA repair, ischemic cardiomyopathy/chronic systolic heart failure,  Medtronic CRT-D system.   Admitted 4/14 through 07/06/16 with volume overload. Diuresed with IV lasix and transitioned to torsemide 60 mg daily. HF meds adjusted and he was started on Entresto. Discharge weight was 220 pounds.  He now has a Cardiomems device. Torsemide has been increased to 80 mg bid based on Cardiomems evaluation.   Echo in 4/19 showed EF improved to 40-45%.  He stopped eplerenone due to breast pain which has resolved (same thing with spironolactone).   RHC was done in 7/19, PA pressure from [place] matched Cardiomems (there had been some question of Cardiomems accuracy).   Admitted 01/2018 for left thoracotomy and epicardial lead placement by [facility]. Unfortunately that was not successful. He was discharged on 02/02/2018. Creatinine at the time of discharge was 1.5.   Seen in clinic 02/22/18 for acute visit for increased edema and shortness of breath. Last week cardiomems was up to 27 and he was instructed to take 2.5 mg metolazone. Cardiomems reading went down to 22 but has been trending back up. 02/22/18 he reported increased leg edema and and shortness of breath. Complained of fatigue. Had barbeque the previous night and had been drinking lots of fluids throughout the day. Denied PND/Orthopnea. Weight has gone up a few pounds. Complaining of leg fatigue.  No bleeding problems. Reports taking all medications as directed. He presents today for scheduled for RHC which showed elevated filling pressures and low cardiac index with pulmonary venous hypertension.   [place] 02/23/18  1. Elevated right and left heart filling pressures with pulmonary venous hypertension.  2. Cardiac index low at 1.9.  RA mean  12 RV 58/10 PA 61/29, mean 40 PCWP mean 27 Oxygen saturations: PA 55% AO 95% Cardiac Output (Fick) 4.2  Cardiac Index (Fick) 1.91 PVR 3 WU  Cardiomems Reading on 02/21/2018--> 34  ECG (10/19): Atrial fibrillation, LBBB-like IVCD 164 msec   Labs (5/18): K 4.7, creatinine 1.7, hgb 15.8, BNP 1001, LDL 71 Labs (8/18): K 4.3, creatinine 1.56, hgb 14.3 Labs (9/18): K 4, creatinine 1.79, digoxin 0.8 Labs (10/18): digoxin 0.3 Labs (11/18): K 4, creatinine 1.71 Labs (4/19): K 3.6, creatinine 1.45, LDL 52, HDL 42 Labs (5/19): K 4.1, creatinine 1.71 Labs (8/19): K 4.2 => 3.9, creatinine 1.5 => 1.48, hgb 14.8, digoxin 0.5 Labs (10/19): hgb 14.1, digoxin 0.5, BNP 1274, K 3.4, creatinine 1.39 Labs (01/31/2018): K 3.7 Creatinine 1.5   PMH: 1. CAD: s/p CABG.  - LHC (12/17): totally occluded distal RCA, totally occluded mid LAD, totally occluded OM2 and OM3.  LIMA-LAD patent with 60% ostial stenosis.  SVG-OM3 and SVG-PDA were patent. Medical management.  2. Chronic systolic CHF: Ischemic cardiomyopathy.  He has a Medtronic CRT-D device but LV lead not turned on due to high threshold.  - Echo (12/17) with EF 35-40%, diffuse hypokinesis, normal RV size and systolic function, mild MR.  - CardioMems placed.  - Painful gynecomastia with spironolactone and eplerenone.  - Echo (4/19) with EF 40-45%, mild MR.  - RHC (7/19) with mean RA 9, PA 43/21 mean 30, mean PCWP 19, CI 2.15, PVR 2.3 WU (PA pressure matched Cardiomems).  - Echo (10/19): EF 35-40%, mild LVH, septal-lateral dyssynchrony, grade II diastolic dysfunction, moderate MR,  mildly dilated RV with mildly decreased systolic function - 47/82 attempted to place epicardial LV lead but unsuccessful.  3. AAA: s/p repair.  4. Atrial fibrillation: Permanent.  5. Gout 6. PAD: Saw Dr Scot Dock with [facility] in 4/18.  Noninvasive study concerning for infra-inguinal arterial occlusive disease bilaterally but appeared to have adequate circulation to heal toe ulcer.   7. CKD stage 3.  8. Ascending aortic aneurysm: CT chest 3/18 ascending aorta 4.9 cm, arch 4.4 cm.  - CTA chest (5/19): 5.2 cm ascending aorta.  9. Spinal stenosis 10. Polycythemia vera 11. Hyperlipidemia 12. Type II diabetes    ROS: All systems negative except as listed in HPI, PMH and Problem List.  Social History   Socioeconomic History  . Marital status: Married    Spouse name: Not on file  . Number of children: Not on file  . Years of education: Not on file  . Highest education level: Not on file  Occupational History  . Occupation: Retired- Nurse, mental health  Social Needs  . Financial resource strain: Not on file  . Food insecurity:    Worry: Not on file    Inability: Not on file  . Transportation needs:    Medical: Not on file    Non-medical: Not on file  Tobacco Use  . Smoking status: Former Smoker    Packs/day: 3.00    Years: 39.00    Pack years: 117.00    Types: Cigarettes    Start date: 05/24/1956    Last attempt to quit: 03/22/1995    Years since quitting: 22.9  . Smokeless tobacco: Never Used  Substance and Sexual Activity  . Alcohol use: Yes    Alcohol/week: 2.0 standard drinks    Types: 2 Cans of beer per week  . Drug use: No  . Sexual activity: Never  Lifestyle  . Physical activity:    Days per week: Not on file    Minutes per session: Not on file  . Stress: Not on file  Relationships  . Social connections:    Talks on phone: Not on file    Gets together: Not on file    Attends religious service: Not on file    Active member of club or organization: Not on file    Attends meetings of clubs or organizations: Not on file    Relationship status: Not on file  . Intimate partner violence:    Fear of current or ex partner: Not on file    Emotionally abused: Not on file    Physically abused: Not on file    Forced sexual activity: Not on file  Other Topics Concern  . Not on file  Social History Narrative  . Not on file    Family History    Problem Relation Age of Onset  . Hypertension Mother   . Stroke Mother   . Diabetes Father   . Coronary artery disease Father   . Other Father        DVT  . Diabetes Brother   . Colon cancer Neg Hx   . Heart attack Neg Hx      Current Facility-Administered Medications  Medication Dose Route Frequency Provider Last Rate Last Dose  . 0.9 %  sodium chloride infusion  250 mL Intravenous PRN Georgiana Shore, NP      . 0.9 %  sodium chloride infusion   Intravenous Continuous Georgiana Shore, NP 10 mL/hr at 02/23/18 1131    . 0.9 %  sodium  chloride infusion  250 mL Intravenous PRN Georgiana Shore, NP      . acetaminophen (TYLENOL) tablet 650 mg  650 mg Oral Q4H PRN Georgiana Shore, NP      . allopurinol (ZYLOPRIM) tablet 200 mg  200 mg Oral QPM Georgiana Shore, NP      . amLODipine (NORVASC) tablet 5 mg  5 mg Oral Daily Georgiana Shore, NP      . atorvastatin (LIPITOR) tablet 10 mg  10 mg Oral Daily Georgiana Shore, NP      . carboxymethylcellulose (REFRESH PLUS) 0.5 % ophthalmic solution 2 drop  2 drop Both Eyes Daily Georgiana Shore, NP      . carvedilol (COREG) tablet 6.25 mg  6.25 mg Oral BID WC Georgiana Shore, NP      . cholecalciferol (VITAMIN D) tablet 1,000 Units  1,000 Units Oral Daily Georgiana Shore, NP      . DULoxetine (CYMBALTA) DR capsule 30 mg  30 mg Oral Daily Georgiana Shore, NP      . furosemide (LASIX) injection 80 mg  80 mg Intravenous BID Georgiana Shore, NP      . gabapentin (NEURONTIN) capsule 600 mg  600 mg Oral TID Georgiana Shore, NP      . insulin aspart (novoLOG) injection 0-5 Units  0-5 Units Subcutaneous QHS Georgiana Shore, NP      . insulin aspart (novoLOG) injection 0-9 Units  0-9 Units Subcutaneous TID WC Georgiana Shore, NP      . insulin NPH Human (HUMULIN N,NOVOLIN N) injection 6-8 Units  6-8 Units Subcutaneous See admin instructions Georgiana Shore, NP      . ipratropium (ATROVENT) 0.06 % nasal spray 2 spray  2 spray Each Nare Daily Georgiana Shore,  NP      . methocarbamol (ROBAXIN) tablet 750 mg  750 mg Oral BID PRN Georgiana Shore, NP      . milrinone (PRIMACOR) 20 MG/100 ML (0.2 mg/mL) infusion  0.25 mcg/kg/min Intravenous Continuous Georgiana Shore, NP      . ondansetron [facility]) injection 4 mg  4 mg Intravenous Q6H PRN Georgiana Shore, NP      . pantoprazole (PROTONIX) EC tablet 80 mg  80 mg Oral Daily Georgiana Shore, NP      . potassium chloride SA (K-DUR,KLOR-CON) CR tablet 40 mEq  40 mEq Oral Daily Georgiana Shore, NP      . sodium chloride flush (NS) 0.9 % injection 3 mL  3 mL Intravenous Q12H Georgiana Shore, NP      . sodium chloride flush (NS) 0.9 % injection 3 mL  3 mL Intravenous PRN Georgiana Shore, NP      . sodium chloride flush (NS) 0.9 % injection 3 mL  3 mL Intravenous Q12H Georgiana Shore, NP      . sodium chloride flush (NS) 0.9 % injection 3 mL  3 mL Intravenous PRN Georgiana Shore, NP        Vitals:   02/23/18 1255 02/23/18 1300 02/23/18 1305 02/23/18 1310  BP: 129/85 130/82 134/76   Pulse: 67 67 62 81  Resp: 17 17 19  (!) 23  Temp:      TempSrc:      SpO2: 96% 99% 97% (!) 0%  Weight:      Height:       Wt Readings from Last 3 Encounters:  02/23/18 97.5 kg  02/22/18 99.3 kg  02/14/18 98.4 kg   PHYSICAL EXAM: General:  NAD  HEENT: normal Neck: supple. JVP 11-12 cm. Carotids 2+ bilat; no bruits. No lymphadenopathy or thryomegaly appreciated. Cor: PMI nondisplaced. Regular rate & rhythm. No rubs, gallops or murmurs. L chest incision approximated.  Lungs: clear Abdomen: soft, nontender, distended. No hepatosplenomegaly. No bruits or masses. Good bowel sounds. Extremities: no cyanosis, clubbing, rash, edema Neuro: alert & orientedx3, cranial nerves grossly intact. moves all 4 extremities w/o difficulty. Affect pleasant  EKG: Sinus Loletha Grayer with PVCs. 57 bpm    ASSESSMENT & PLAN: 1. Acute on chronic Systolic Heart Failure: Ischemic cardiomyopathy. 12/2017 Echo, EF 35-40%, stable. Medtronic CRT-D but LV  lead turned off due to high threshold.  11/20109 Unable to place epicardial lead. PADP 34 on cardiomems and has been trending up. Creatinine worsening, up to 2.87 yesterday.  - Elevated filling pressures and low cardiac output on RHC this am.  - Start IV lasix 80 mg BID - Start milrinone 0.25 mcg/kg/min.  - Insert PICC line for CVP/Coox.  - Hold Entresto and digoxin with AKI.  - Unable to take eplerenone or spironolactone due to breast tenderness.  This has mostly resolved off these meds.   - Decrease coreg to 6.25 mg BID with low output.  - Will get repeat echo.  2. Atrial fibrillation: Permanent.  Rate controlled.   - Continue warfarin (hold until INR comes down).   - Continue Coreg for rate control at lower dose.  3. CAD: s/p CABG.  He has atypical chest pain.  - No ASA given warfarin use.  - Continue statin.  Good lipids 4/19.   - Would hold off on coronary angiography as echo 01/04/18 showed fairly stable systolic function and he has AKI.  Will recheck echo this admit.  4. AKI on CKD Stage III:  Check BMET now. Stopped digoxin and Entresto.  5. Gout: stable. Continue allopurinol.  6. Ascending aortic aneurysm: 5/19 CT chest with 5.2 cm ascending aorta.  He saw Dr. Servando Snare.  Aneurysm has been relatively stable, would be high risk for operation.  - Keep SBP < 130 - Will get repeat CT chest w/o contrast through Dr. Everrett Coombe office.     Admitted from cath lab with low cardiac output and volume overload. Will start on IV lasix and place PICC for CVP/Coox.   Shirley Friar, PA-C  02/23/2018  Advanced Heart Failure Team Pager [phone] (M-F; 7a - 4p)  Please contact [facility] for night-coverage after hours (4p -7a ) and weekends on [email or web address]  Patient seen with PA, agree with the above note.   He had attempted LV epicardial lead placement via surgery in 11/19, but this was unsuccessful.  Creatinine at hospital discharge was 1.5.  He was seen in the clinic yesterday with  significant symptomatic worsening and elevated PADP by Cardiomems.  Creatinine was found to be up to 2.87.    He was brought in for RHC today, cardiac index was low at 1.9 and filling pressures were elevated.   I am not sure what the trigger for this presentation was.  Perhaps he had some hypotension with surgery and had an ischemic renal injury that had not fully manifested itself before discharge from the hospital.    On exam, he is volume overloaded with elevated JVP.  He is in chronic atrial fibrillation with an irregular but controlled pulse.    I am going to admit him from the cath lab.  I will place  a PICC line to follow co-ox and CVP.  Continue to hold Entresto and digoxin with AKI.  I will start him on milrinone 0.25 mcg/kg/min (follow HR closely with milrinone and afib), decrease Coreg to 6.25 mg bid, and will start Lasix 80 mg IV bid.    Loralie Champagne 02/23/2018 3:40 PM

## 2018-02-24 ENCOUNTER — Inpatient Hospital Stay (HOSPITAL_COMMUNITY): Payer: Medicare Other

## 2018-02-24 DIAGNOSIS — N179 Acute kidney failure, unspecified: Secondary | ICD-10-CM

## 2018-02-24 DIAGNOSIS — I255 Ischemic cardiomyopathy: Secondary | ICD-10-CM

## 2018-02-24 DIAGNOSIS — I370 Nonrheumatic pulmonary valve stenosis: Secondary | ICD-10-CM

## 2018-02-24 DIAGNOSIS — I340 Nonrheumatic mitral (valve) insufficiency: Secondary | ICD-10-CM

## 2018-02-24 LAB — BASIC METABOLIC PANEL
Anion gap: 8 (ref 5–15)
BUN: 47 mg/dL — ABNORMAL HIGH (ref 8–23)
CO2: 28 mmol/L (ref 22–32)
Calcium: 8.7 mg/dL — ABNORMAL LOW (ref 8.9–10.3)
Chloride: 103 mmol/L (ref 98–111)
Creatinine, Ser: 2.63 mg/dL — ABNORMAL HIGH (ref 0.61–1.24)
GFR calc non Af Amer: 23 mL/min — ABNORMAL LOW (ref >60)
GFR, EST AFRICAN AMERICAN: 26 mL/min — AB (ref >60)
Glucose, Bld: 108 mg/dL — ABNORMAL HIGH (ref 70–99)
Potassium: 3.8 mmol/L (ref 3.5–5.1)
Sodium: 139 mmol/L (ref 135–145)

## 2018-02-24 LAB — GLUCOSE, CAPILLARY
GLUCOSE-CAPILLARY: 134 mg/dL — AB (ref 70–99)
Glucose-Capillary: 102 mg/dL — ABNORMAL HIGH (ref 70–99)
Glucose-Capillary: 96 mg/dL (ref 70–99)
Glucose-Capillary: 145 mg/dL — ABNORMAL HIGH (ref 70–99)

## 2018-02-24 LAB — COOXEMETRY PANEL
Carboxyhemoglobin: 1.4 % (ref 0.5–1.5)
Methemoglobin: 1.7 % — ABNORMAL HIGH (ref 0.0–1.5)
O2 Saturation: 57.4 %
Total hemoglobin: 12.4 g/dL (ref 12.0–16.0)

## 2018-02-24 LAB — PROTIME-INR
INR: 3.79
Prothrombin Time: 36.8 seconds — ABNORMAL HIGH (ref 11.4–15.2)

## 2018-02-24 LAB — ECHOCARDIOGRAM COMPLETE
Height: 72 in
WEIGHTICAEL: 3460.8 [oz_av]

## 2018-02-24 MED ORDER — TORSEMIDE 20 MG PO TABS
100.0000 mg | ORAL_TABLET | Freq: Two times a day (BID) | ORAL | Status: DC
  Administered 2018-02-24: 100 mg via ORAL
  Filled 2018-02-24: qty 5

## 2018-02-24 MED ORDER — ISOSORBIDE MONONITRATE ER 30 MG PO TB24
30.0000 mg | ORAL_TABLET | Freq: Every day | ORAL | Status: DC
  Administered 2018-02-24 – 2018-02-28 (×5): 30 mg via ORAL
  Filled 2018-02-24 (×5): qty 1

## 2018-02-24 MED ORDER — FUROSEMIDE 10 MG/ML IJ SOLN
80.0000 mg | Freq: Once | INTRAMUSCULAR | Status: AC
  Administered 2018-02-24: 80 mg via INTRAVENOUS
  Filled 2018-02-24: qty 8

## 2018-02-24 MED ORDER — HYDRALAZINE HCL 10 MG PO TABS
10.0000 mg | ORAL_TABLET | Freq: Three times a day (TID) | ORAL | Status: DC
  Administered 2018-02-24 – 2018-02-25 (×4): 10 mg via ORAL
  Filled 2018-02-24 (×4): qty 1

## 2018-02-24 NOTE — Progress Notes (Signed)
Patient ID: [name], male   DOB: 08/01/1940, 77 y.o.   MRN: [id number]     Advanced Heart Failure Rounding Note  PCP-Cardiologist: No primary care provider on file.   Subjective:    [name] says he feels better today.  Not coughing.  Does not seem to have had marked urine output yesterday but CVP 6 today. Co-ox 57% on milrinone 0.25.  Creatinine trending down 2.87 => 2.72 => 2.6.   RHC Procedural Findings: Hemodynamics (mmHg) RA mean 12 RV 58/10 PA 61/29, mean 40 PCWP mean 27 Oxygen saturations: PA 55% AO 95% Cardiac Output (Fick) 4.2  Cardiac Index (Fick) 1.91 PVR 3 WU   Objective:   Weight Range: 98.1 kg Body mass index is 29.34 kg/m.   Vital Signs:   Temp:  [97.4 F (36.3 C)-98.5 F (36.9 C)] 98.5 F (36.9 C) (12/06 2055) Pulse Rate:  [52-130] 130 (12/07 0412) Resp:  [15-28] 17 (12/07 0412) BP: (114-147)/(62-100) 116/77 (12/07 0412) SpO2:  [0 %-100 %] 96 % (12/07 0412) Weight:  [97.5 kg-98.1 kg] 98.1 kg (12/07 0412) Last BM Date: 02/22/18  Weight change: Filed Weights   02/23/18 1049 02/24/18 0412  Weight: 97.5 kg 98.1 kg    Intake/Output:   Intake/Output Summary (Last 24 hours) at 02/24/2018 0741 Last data filed at 02/23/2018 2343 Gross per 24 hour  Intake 543 ml  Output 975 ml  Net -432 ml      Physical Exam    General:  Well appearing. No resp difficulty HEENT: Normal Neck: Supple. JVP not elevated. Carotids 2+ bilat; no bruits. No lymphadenopathy or thyromegaly appreciated. Cor: PMI nondisplaced. Irregular rate & rhythm with widely split S2. No rubs, gallops or murmurs. Lungs: Clear Abdomen: Soft, nontender, nondistended. No hepatosplenomegaly. No bruits or masses. Good bowel sounds. Extremities: No cyanosis, clubbing, rash. 1+ ankle edema.  Neuro: Alert & orientedx3, cranial nerves grossly intact. moves all 4 extremities w/o difficulty. Affect pleasant   Telemetry   Atrial fibrillation with occasional PVCs (personally  reviewed).    Labs    CBC Recent Labs    02/22/18 1417 02/23/18 1756  WBC 6.7 5.6  HGB 12.8* 12.8*  HCT 40.2 41.3  MCV 93.3 93.0  PLT 183 409   Basic Metabolic Panel Recent Labs    02/23/18 1756 02/24/18 0529  NA 138 139  K 4.4 3.8  CL 102 103  CO2 23 28  GLUCOSE 129* 108*  BUN 48* 47*  CREATININE 2.72* 2.63*  CALCIUM 8.7* 8.7*   Liver Function Tests No results for input(s): AST, ALT, ALKPHOS, BILITOT, PROT, ALBUMIN in the last 72 hours. No results for input(s): LIPASE, AMYLASE in the last 72 hours. Cardiac Enzymes No results for input(s): CKTOTAL, CKMB, CKMBINDEX, TROPONINI in the last 72 hours.  BNP: BNP (last 3 results) Recent Labs    12/29/17 1048 02/22/18 1215  BNP 1,274.1* 2,025.5*    ProBNP (last 3 results) No results for input(s): PROBNP in the last 8760 hours.   D-Dimer No results for input(s): DDIMER in the last 72 hours. Hemoglobin A1C No results for input(s): HGBA1C in the last 72 hours. Fasting Lipid Panel No results for input(s): CHOL, HDL, LDLCALC, TRIG, CHOLHDL, LDLDIRECT in the last 72 hours. Thyroid Function Tests No results for input(s): TSH, T4TOTAL, T3FREE, THYROIDAB in the last 72 hours.  Invalid input(s): FREET3  Other results:   Imaging    Dg Chest Port 1 View  Result Date: 02/23/2018 CLINICAL DATA:  PICC line placement EXAM:  PORTABLE CHEST 1 VIEW COMPARISON:  02/14/2018 FINDINGS: Left arm PICC is in place. Tip is probably in the SVC 2 cm above the right atrium. Numerous other devices overlie the region, as well as chronic pleural density which is linear. Pacemaker/AICD is unchanged. Neuro stimulators appear unchanged. Previous median sternotomy and CABG. Cardiomegaly. Worsened pulmonary edema. Slight increase in pleural effusions. IMPRESSION: Worsened edema pattern. Slight increase in effusions. Left arm PICC tip probably in the SVC 2 cm above the right atrium. Numerous other densities overlie the region as noted above.  Electronically Signed   By: [name] M.D.   On: 02/23/2018 17:42   [place] Ekg Site Rite  Result Date: 02/23/2018 If Site Rite image not attached, placement could not be confirmed due to current cardiac rhythm.     Medications:     Scheduled Medications: . allopurinol  200 mg Oral QPM  . amLODipine  5 mg Oral Daily  . atorvastatin  10 mg Oral q1800  . carvedilol  6.25 mg Oral BID WC  . cholecalciferol  1,000 Units Oral Daily  . DULoxetine  30 mg Oral Daily  . furosemide  80 mg Intravenous Once  . gabapentin  600 mg Oral TID  . hydrALAZINE  10 mg Oral Q8H  . insulin aspart  0-5 Units Subcutaneous QHS  . insulin aspart  0-9 Units Subcutaneous TID WC  . insulin NPH Human  6 Units Subcutaneous QHS  . insulin NPH Human  8 Units Subcutaneous QAC breakfast  . ipratropium  2 spray Each Nare Daily  . isosorbide mononitrate  30 mg Oral Daily  . pantoprazole  80 mg Oral Daily  . polyvinyl alcohol  2 drop Both Eyes Daily  . potassium chloride SA  40 mEq Oral Daily  . sodium chloride flush  3 mL Intravenous Q12H  . sodium chloride flush  3 mL Intravenous Q12H  . sodium chloride flush  3 mL Intravenous Q12H  . torsemide  100 mg Oral BID     Infusions: . sodium chloride    . sodium chloride 10 mL/hr at 02/23/18 1131  . sodium chloride    . sodium chloride    . milrinone 0.25 mcg/kg/min (02/23/18 1954)     PRN Medications:  sodium chloride, sodium chloride, sodium chloride, acetaminophen, methocarbamol, ondansetron (ZOFRAN) IV, sodium chloride flush, sodium chloride flush, sodium chloride flush   Assessment/Plan   1. Acute on chronic systolic CHF: Ischemic cardiomyopathy. 12/2017 Echo, EF 35-40%, stable. Medtronic CRT-D but LV lead turned off due to high threshold.  11/20109 Unable to place epicardial lead surgically. PADP 34 on Cardiomems on 12/5 and has been trending up. Creatinine worsening, up to 2.87 12/5.  Patient was admitted after [place] on 12/6 showed low output with CI 1.9  and elevated filling pressures.  PICC placed and started on milrinone 0.25 with IV Lasix.  CXR showed pulmonary edema.  This morning, co-ox 57% and CVP is actually about 6.  He says he feels better.  Creatinine down a bit to 2.6.  - CVP surprisingly low (checked myself).  I will give Lasix 80 mg IV x 1 today then start back on home torsemide 100 mg bid this evening.  - Continue milrinone 0.25 mcg/kg/min for now, if creatinine continues to come down will start weaning perhaps tomorrow.  - Hold Entresto and digoxin with AKI.  - Unable to take eplerenone or spironolactone due to breast tenderness.  This has mostly resolved off these meds.   - Decreased coreg  to 6.25 mg BID with low output.  - Will get repeat echo today. - Start hydralazine 10 mg tid + Imdur 30 daily.   2. Atrial fibrillation: Permanent.  Rate controlled.   - Continue warfarin (hold until INR comes down).   - Continue Coreg for rate control at lower dose.  3. CAD: s/p CABG.  No recent chest pain.  - No ASA given warfarin use.  - Continue statin.  Good lipids 4/19.   4. AKI on CKD Stage III:  Stopped digoxin and Entresto.  Creatinine now seems to be trending down.  Maintain cardiac output for now with milrinone.  5. Gout: stable. Continue allopurinol.  6. Ascending aortic aneurysm: 5/19 CT chest with 5.2 cm ascending aorta.  He saw Dr. Servando Snare.  Aneurysm has been relatively stable, would be high risk for operation.   Length of Stay: 1  Loralie Champagne, MD  02/24/2018, 7:41 AM  Advanced Heart Failure Team Pager [phone] (M-F; 7a - 4p)  Please contact [facility] for night-coverage after hours (4p -7a ) and weekends on [email or web address]

## 2018-02-24 NOTE — Progress Notes (Signed)
[place] for warfarin Indication: atrial fibrillation  Allergies  Allergen Reactions  . Avelox [Moxifloxacin Hcl In Nacl] Swelling, Rash and Other (See Comments)    Patient became hypotensive after infusion started Because of a history of documented adverse serious drug reaction;Medi Alert bracelet  is recommended  . Other Rash and Swelling    Patient became hypotensive after infusion started Because of a history of documented adverse serious drug reaction;Medi Alert braceletis recommended  . Penicillins Anaphylaxis, Swelling and Other (See Comments)    Because of a history of documented adverse serious drug reaction;Medi Alert bracelet  is recommended Has patient had a PCN reaction causing immediate rash, facial/tongue/throat swelling, SOB or lightheadedness with hypotension: Yes Has patient had a PCN reaction causing severe rash involving mucus membranes or skin necrosis: No Has patient had a PCN reaction that required hospitalization: Unknown Has patient had a PCN reaction occurring within the last 10 years: No    Patient Measurements: Height: 6' (182.9 cm) Weight: 216 lb 4.8 oz (98.1 kg) IBW/kg (Calculated) : 77.6  Vital Signs: Temp: 97.9 F (36.6 C) (12/07 1100) Temp Source: Oral (12/07 1100) BP: 112/62 (12/07 1100) Pulse Rate: 60 (12/07 1100)  Labs: Recent Labs    02/22/18 1214  02/22/18 1417 02/23/18 1044 02/23/18 1756 02/24/18 0529  HGB  --   --  12.8*  --  12.8*  --   HCT  --   --  40.2  --  41.3  --   PLT  --   --  183  --  220  --   LABPROT  --    < > 40.1* 38.0* 35.7* 36.8*  INR  --    < > 4.24* 3.95 3.64 3.79  CREATININE 2.87*  --   --   --  2.72* 2.63*   < > = values in this interval not displayed.    Estimated Creatinine Clearance: 29 mL/min (A) (by C-G formula based on SCr of 2.63 mg/dL (H)).   Medical History: Past Medical History:  Diagnosis Date  . AAA (abdominal aortic aneurysm) ([place])    Surgical  repair 11/2002.  . Adenomatous colon polyp   . AICD (automatic cardioverter/defibrillator) present   . Alcohol ingestion of more than four drinks per week    Excess beer  not a dependency problem  . Aortic valve sclerosis   . Arthritis   . Atrial fibrillation ([place])    Previous long-term amiodarone therapy with multiple cardioversions / amiodarone stopped September, 2009  . Atrial flutter [facility])    Started November, 2010, Left-sided and cannot ablate  . Bony abnormality    Patient's manubrium is slightly displaced to the right  . CAD (coronary artery disease)    a. s/p CABG 2004;  b. 02/2016 Cath: LM nl, LAD 70p, 138m, D1 50, D2 50, RI 50ost, LCX 47m, OM2 100, OM3 100, RCA 70p, 100d, VG->OM3 ok, LIMA->LAD 60ost, VG->RPDA  ok.  . 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 01/2013 MDT DTBA 1D1 [employer] CRT-D, ser # [id number] H.  . Carotid artery disease ([place])    Doppler, December, 2013, 0-39% bilateral  . Chronic systolic CHF (congestive heart failure) ([place])    a. 02/2016 Echo: EF 35-40%, diff HK, triv AI, mildly dil Ao root 74mm, mild MR, sev dil LA, triv TR.  Marland Kitchen Chronotropic incompetence    IV pacing rate adjusted  . CKD (chronic kidney disease), stage III ([place])   . COPD (  chronic obstructive pulmonary disease) ([place])   . Dilated aortic root ([place])   . Discolored skin   . Diverticulosis   . Drug therapy    Redness and swelling with Avelox infusion May 24, 2011  . Dyspnea   . Eye abnormality    Ophthalmologist questions a clot in one of his eyes, May, 2012  . Gout   . Hyperlipidemia   . Hypertension   . Internal hemorrhoids   . Ischemic cardiomyopathy   . Left atrial thrombus    Remote past... cardioversions done since that time  . Mitral regurgitation    Mild echo  . Myocardial infarction ([place])   . Nasal drainage    Chronic  . Overweight(278.02)   . Pericardial effusion   . Pleural effusion    Large loculated effusion on the left side November,  2011. This was tapped. It was exudative. Cytology revealed no cancer no proof of mesothelioma area pulmonary team felt that no further workup was needed  . Pleural thickening   . Pneumonia   . 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 Presence of permanent cardiac pacemaker   . SOB (shortness of breath)    Large left effusion/ thoracentesis/hospitalization/November, 2011... exudated.. cytology negative.. Dr.Wert.. no proof of mesothelioma  . Spinal stenosis    Surgery Dr.Elsner  . Thrombophlebitis of superficial veins of upper extremities    Possible venous stenosis from defibrillator  . Type II diabetes mellitus ([place]) 2008  . Venous insufficiency    Toe discoloration chronic  . Ventral hernia    April, 2014, result of his abdominal surgery  . Warfarin anticoagulation   . Wide-complex tachycardia ([place])     Assessment: 84 yoM admitted for RHC found to have volume overload and low output. Pt on warfarin PTA for hx of PAF with last dose 12/4 (pt advised to hold on 12/5 after clinic visit). INR on admit 3.95, home regimen is 2.5mg  daily except 5mg  Tuesday.  INR remains elevated at 3.79, trend up. CBC stable. No active bleed issues documented.  Goal of Therapy:  INR 2-3 Monitor platelets by anticoagulation protocol: Yes   Plan:  -Hold warfarin tonight -Monitor daily INR, CBC, s/sx bleeding  Elicia Lamp, PharmD, BCPS Clinical Pharmacist Clinical phone [phone] Please check AMION for all [place] contact numbers 02/24/2018 12:34 PM

## 2018-02-24 NOTE — Progress Notes (Signed)
PHASE I CARDIAC REHAB  Pt getting bedside echocardiogram.  Will assess readiness to ambulate after pt eats lunch. Andi Hence, RN, BSN Cardiac Pulmonary Rehab

## 2018-02-24 NOTE — Progress Notes (Signed)
PHASE I CARDIAC REHAB  Pt sound asleep.  Ambulation deferred.  No family at bedside.  Nursing staff made aware.  Andi Hence, RN, BSN Cardiac Pulmonary Rehab

## 2018-02-24 NOTE — Progress Notes (Signed)
  Echocardiogram 2D Echocardiogram has been performed.  [name] 02/24/2018, 12:06 PM

## 2018-02-25 LAB — CBC WITH DIFFERENTIAL/PLATELET
Abs Immature Granulocytes: 0.01 10*3/uL (ref 0.00–0.07)
Basophils Absolute: 0 10*3/uL (ref 0.0–0.1)
Basophils Relative: 0 %
Eosinophils Absolute: 0.1 10*3/uL (ref 0.0–0.5)
Eosinophils Relative: 2 %
HCT: 35.4 % — ABNORMAL LOW (ref 39.0–52.0)
Hemoglobin: 11.2 g/dL — ABNORMAL LOW (ref 13.0–17.0)
Immature Granulocytes: 0 %
Lymphocytes Relative: 11 %
Lymphs Abs: 0.5 10*3/uL — ABNORMAL LOW (ref 0.7–4.0)
MCH: 29.2 pg (ref 26.0–34.0)
MCHC: 31.6 g/dL (ref 30.0–36.0)
MCV: 92.2 fL (ref 80.0–100.0)
Monocytes Absolute: 0.6 10*3/uL (ref 0.1–1.0)
Monocytes Relative: 13 %
NRBC: 0 % (ref 0.0–0.2)
Neutro Abs: 3.4 10*3/uL (ref 1.7–7.7)
Neutrophils Relative %: 74 %
Platelets: 164 10*3/uL (ref 150–400)
RBC: 3.84 MIL/uL — AB (ref 4.22–5.81)
RDW: 15.2 % (ref 11.5–15.5)
WBC: 4.6 10*3/uL (ref 4.0–10.5)

## 2018-02-25 LAB — BASIC METABOLIC PANEL
Anion gap: 10 (ref 5–15)
BUN: 43 mg/dL — ABNORMAL HIGH (ref 8–23)
CO2: 26 mmol/L (ref 22–32)
CREATININE: 2.66 mg/dL — AB (ref 0.61–1.24)
Calcium: 8.5 mg/dL — ABNORMAL LOW (ref 8.9–10.3)
Chloride: 102 mmol/L (ref 98–111)
GFR calc Af Amer: 26 mL/min — ABNORMAL LOW (ref >60)
GFR calc non Af Amer: 22 mL/min — ABNORMAL LOW (ref >60)
Glucose, Bld: 155 mg/dL — ABNORMAL HIGH (ref 70–99)
Potassium: 3.8 mmol/L (ref 3.5–5.1)
Sodium: 138 mmol/L (ref 135–145)

## 2018-02-25 LAB — PROTIME-INR
INR: 3.44
Prothrombin Time: 34.2 seconds — ABNORMAL HIGH (ref 11.4–15.2)

## 2018-02-25 LAB — COOXEMETRY PANEL
Carboxyhemoglobin: 1.6 % — ABNORMAL HIGH (ref 0.5–1.5)
Methemoglobin: 1.6 % — ABNORMAL HIGH (ref 0.0–1.5)
O2 Saturation: 83.6 %
Total hemoglobin: 11.3 g/dL — ABNORMAL LOW (ref 12.0–16.0)

## 2018-02-25 LAB — GLUCOSE, CAPILLARY
GLUCOSE-CAPILLARY: 134 mg/dL — AB (ref 70–99)
GLUCOSE-CAPILLARY: 120 mg/dL — AB (ref 70–99)
Glucose-Capillary: 179 mg/dL — ABNORMAL HIGH (ref 70–99)

## 2018-02-25 MED ORDER — AMLODIPINE BESYLATE 2.5 MG PO TABS
2.5000 mg | ORAL_TABLET | Freq: Every day | ORAL | Status: DC
  Administered 2018-02-25: 2.5 mg via ORAL
  Filled 2018-02-25 (×3): qty 1

## 2018-02-25 MED ORDER — HYDRALAZINE HCL 25 MG PO TABS
25.0000 mg | ORAL_TABLET | Freq: Three times a day (TID) | ORAL | Status: DC
  Administered 2018-02-25 – 2018-02-28 (×9): 25 mg via ORAL
  Filled 2018-02-25 (×9): qty 1

## 2018-02-25 MED ORDER — POTASSIUM CHLORIDE CRYS ER 20 MEQ PO TBCR
20.0000 meq | EXTENDED_RELEASE_TABLET | Freq: Once | ORAL | Status: AC
  Administered 2018-02-25: 20 meq via ORAL
  Filled 2018-02-25: qty 1

## 2018-02-25 MED ORDER — METOLAZONE 5 MG PO TABS
2.5000 mg | ORAL_TABLET | Freq: Once | ORAL | Status: AC
  Administered 2018-02-25: 2.5 mg via ORAL
  Filled 2018-02-25: qty 1

## 2018-02-25 MED ORDER — FUROSEMIDE 10 MG/ML IJ SOLN
80.0000 mg | Freq: Two times a day (BID) | INTRAMUSCULAR | Status: DC
  Administered 2018-02-25 (×2): 80 mg via INTRAVENOUS
  Filled 2018-02-25 (×2): qty 8

## 2018-02-25 NOTE — Progress Notes (Signed)
[place] for warfarin Indication: atrial fibrillation  Allergies  Allergen Reactions  . Avelox [Moxifloxacin Hcl In Nacl] Swelling, Rash and Other (See Comments)    Patient became hypotensive after infusion started Because of a history of documented adverse serious drug reaction;Medi Alert bracelet  is recommended  . Other Rash and Swelling    Patient became hypotensive after infusion started Because of a history of documented adverse serious drug reaction;Medi Alert braceletis recommended  . Penicillins Anaphylaxis, Swelling and Other (See Comments)    Because of a history of documented adverse serious drug reaction;Medi Alert bracelet  is recommended Has patient had a PCN reaction causing immediate rash, facial/tongue/throat swelling, SOB or lightheadedness with hypotension: Yes Has patient had a PCN reaction causing severe rash involving mucus membranes or skin necrosis: No Has patient had a PCN reaction that required hospitalization: Unknown Has patient had a PCN reaction occurring within the last 10 years: No    Patient Measurements: Height: 6' (182.9 cm) Weight: 220 lb 8 oz (100 kg) IBW/kg (Calculated) : 77.6  Vital Signs: Temp: 97.7 F (36.5 C) (12/08 0751) Temp Source: Oral (12/08 0751) BP: 114/73 (12/08 0751) Pulse Rate: 122 (12/08 0751)  Labs: Recent Labs    02/22/18 1417  02/23/18 1756 02/24/18 0529 02/25/18 0515  HGB 12.8*  --  12.8*  --  11.2*  HCT 40.2  --  41.3  --  35.4*  PLT 183  --  220  --  164  LABPROT 40.1*   < > 35.7* 36.8* 34.2*  INR 4.24*   < > 3.64 3.79 3.44  CREATININE  --   --  2.72* 2.63* 2.66*   < > = values in this interval not displayed.    Estimated Creatinine Clearance: 28.9 mL/min (A) (by C-G formula based on SCr of 2.66 mg/dL (H)).   Medical History: Past Medical History:  Diagnosis Date  . AAA (abdominal aortic aneurysm) ([place])    Surgical repair 11/2002.  . Adenomatous colon polyp   .  AICD (automatic cardioverter/defibrillator) present   . Alcohol ingestion of more than four drinks per week    Excess beer  not a dependency problem  . Aortic valve sclerosis   . Arthritis   . Atrial fibrillation ([place])    Previous long-term amiodarone therapy with multiple cardioversions / amiodarone stopped September, 2009  . Atrial flutter [facility])    Started November, 2010, Left-sided and cannot ablate  . Bony abnormality    Patient's manubrium is slightly displaced to the right  . CAD (coronary artery disease)    a. s/p CABG 2004;  b. 02/2016 Cath: LM nl, LAD 70p, 121m, D1 50, D2 50, RI 50ost, LCX 77m, OM2 100, OM3 100, RCA 70p, 100d, VG->OM3 ok, LIMA->LAD 60ost, VG->RPDA  ok.  . 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 01/2013 MDT DTBA 1D1 [place] CRT-D, ser # [id number] H.  . Carotid artery disease ([place])    Doppler, December, 2013, 0-39% bilateral  . Chronic systolic CHF (congestive heart failure) ([place])    a. 02/2016 Echo: EF 35-40%, diff HK, triv AI, mildly dil Ao root 30mm, mild MR, sev dil LA, triv TR.  Marland Kitchen Chronotropic incompetence    IV pacing rate adjusted  . CKD (chronic kidney disease), stage III ([place])   . COPD (chronic obstructive pulmonary disease) ([place])   . Dilated aortic root ([place])   . Discolored skin   . Diverticulosis   . Drug therapy  Redness and swelling with Avelox infusion May 24, 2011  . Dyspnea   . Eye abnormality    Ophthalmologist questions a clot in one of his eyes, May, 2012  . Gout   . Hyperlipidemia   . Hypertension   . Internal hemorrhoids   . Ischemic cardiomyopathy   . Left atrial thrombus    Remote past... cardioversions done since that time  . Mitral regurgitation    Mild echo  . Myocardial infarction ([place])   . Nasal drainage    Chronic  . Overweight(278.02)   . Pericardial effusion   . Pleural effusion    Large loculated effusion on the left side November, 2011. This was tapped. It was exudative. Cytology  revealed no cancer no proof of mesothelioma area pulmonary team felt that no further workup was needed  . Pleural thickening   . Pneumonia   . 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 Presence of permanent cardiac pacemaker   . SOB (shortness of breath)    Large left effusion/ thoracentesis/hospitalization/November, 2011... exudated.. cytology negative.. Dr.Wert.. no proof of mesothelioma  . Spinal stenosis    Surgery Dr.Elsner  . Thrombophlebitis of superficial veins of upper extremities    Possible venous stenosis from defibrillator  . Type II diabetes mellitus ([place]) 2008  . Venous insufficiency    Toe discoloration chronic  . Ventral hernia    April, 2014, result of his abdominal surgery  . Warfarin anticoagulation   . Wide-complex tachycardia ([place])     Assessment: 55 yoM admitted for RHC found to have volume overload and low output. Pt on warfarin PTA for hx of PAF with last dose 12/4 (pt advised to hold on 12/5 after clinic visit). INR on admit 3.95, home regimen is 2.5mg  daily except 5mg  Tuesday.  INR remains elevated at 3.44, trending down today. Hg down a bit to 11.2, plt wnl. No active bleed issues documented.  Goal of Therapy:  INR 2-3 Monitor platelets by anticoagulation protocol: Yes   Plan:  -Hold warfarin again tonight -Monitor daily INR, CBC, s/sx bleeding  Elicia Lamp, PharmD, BCPS Clinical Pharmacist Clinical phone [phone] Please check AMION for all [place] contact numbers 02/25/2018 11:47 AM

## 2018-02-25 NOTE — Progress Notes (Signed)
Patient ID: [name], male   DOB: September 17, 1940, 77 y.o.   MRN: [id number]     Advanced Heart Failure Rounding Note  PCP-Cardiologist: No primary care provider on file.   Subjective:    He feels better than prior to admission, not coughing.  CVP this morning is 14-15 on my check.  Co-ox 84% on milrinone 0.25.  Creatinine fairly stable at 2.87 => 2.72 => 2.6 => 2.66.   Echo: Ef 35-40%, mild LV dilation, mild AI, mild MR, normal RV  RHC Procedural Findings: Hemodynamics (mmHg) RA mean 12 RV 58/10 PA 61/29, mean 40 PCWP mean 27 Oxygen saturations: PA 55% AO 95% Cardiac Output (Fick) 4.2  Cardiac Index (Fick) 1.91 PVR 3 WU   Objective:   Weight Range: 100 kg Body mass index is 29.91 kg/m.   Vital Signs:   Temp:  [97.8 F (36.6 C)-98 F (36.7 C)] 97.9 F (36.6 C) (12/07 1940) Pulse Rate:  [60-98] 96 (12/08 0014) Resp:  [17-25] 17 (12/08 0014) BP: (112-134)/(62-87) 134/80 (12/08 0527) SpO2:  [93 %-100 %] 93 % (12/08 0014) Weight:  [100 kg] 100 kg (12/08 0623) Last BM Date: 02/24/18(per patient)  Weight change: Filed Weights   02/23/18 1049 02/24/18 0412 02/25/18 0623  Weight: 97.5 kg 98.1 kg 100 kg    Intake/Output:   Intake/Output Summary (Last 24 hours) at 02/25/2018 0739 Last data filed at 02/25/2018 0529 Gross per 24 hour  Intake 1722.11 ml  Output 1150 ml  Net 572.11 ml      Physical Exam    General: NAD Neck: JVP 12-14 cm, no thyromegaly or thyroid nodule.  Lungs: Clear to auscultation bilaterally with normal respiratory effort. CV: Nondisplaced PMI.  Mildly tachy, irregular S1/S2, no S3/S4, no murmur.  1+ edema 3/4 to knees bilaterally.   Abdomen: Soft, nontender, no hepatosplenomegaly, no distention.  Skin: Intact without lesions or rashes.  Neurologic: Alert and oriented x 3.  Psych: Normal affect. Extremities: No clubbing or cyanosis.  HEENT: Normal.    Telemetry   Atrial fibrillation with occasional PVCs, HR 90s-110s (personally  reviewed).    Labs    CBC Recent Labs    02/23/18 1756 02/25/18 0515  WBC 5.6 4.6  NEUTROABS  --  3.4  HGB 12.8* 11.2*  HCT 41.3 35.4*  MCV 93.0 92.2  PLT 220 885   Basic Metabolic Panel Recent Labs    02/24/18 0529 02/25/18 0515  NA 139 138  K 3.8 3.8  CL 103 102  CO2 28 26  GLUCOSE 108* 155*  BUN 47* 43*  CREATININE 2.63* 2.66*  CALCIUM 8.7* 8.5*   Liver Function Tests No results for input(s): AST, ALT, ALKPHOS, BILITOT, PROT, ALBUMIN in the last 72 hours. No results for input(s): LIPASE, AMYLASE in the last 72 hours. Cardiac Enzymes No results for input(s): CKTOTAL, CKMB, CKMBINDEX, TROPONINI in the last 72 hours.  BNP: BNP (last 3 results) Recent Labs    12/29/17 1048 02/22/18 1215  BNP 1,274.1* 2,025.5*    ProBNP (last 3 results) No results for input(s): PROBNP in the last 8760 hours.   D-Dimer No results for input(s): DDIMER in the last 72 hours. Hemoglobin A1C No results for input(s): HGBA1C in the last 72 hours. Fasting Lipid Panel No results for input(s): CHOL, HDL, LDLCALC, TRIG, CHOLHDL, LDLDIRECT in the last 72 hours. Thyroid Function Tests No results for input(s): TSH, T4TOTAL, T3FREE, THYROIDAB in the last 72 hours.  Invalid input(s): FREET3  Other results:   Imaging  No results found.   Medications:     Scheduled Medications: . allopurinol  200 mg Oral QPM  . amLODipine  2.5 mg Oral Daily  . atorvastatin  10 mg Oral q1800  . carvedilol  6.25 mg Oral BID WC  . cholecalciferol  1,000 Units Oral Daily  . DULoxetine  30 mg Oral Daily  . furosemide  80 mg Intravenous BID  . gabapentin  600 mg Oral TID  . hydrALAZINE  25 mg Oral Q8H  . insulin aspart  0-5 Units Subcutaneous QHS  . insulin aspart  0-9 Units Subcutaneous TID WC  . insulin NPH Human  6 Units Subcutaneous QHS  . insulin NPH Human  8 Units Subcutaneous QAC breakfast  . ipratropium  2 spray Each Nare Daily  . isosorbide mononitrate  30 mg Oral Daily  .  metolazone  2.5 mg Oral Once  . pantoprazole  80 mg Oral Daily  . polyvinyl alcohol  2 drop Both Eyes Daily  . potassium chloride  20 mEq Oral Once  . potassium chloride SA  40 mEq Oral Daily  . sodium chloride flush  3 mL Intravenous Q12H  . sodium chloride flush  3 mL Intravenous Q12H  . sodium chloride flush  3 mL Intravenous Q12H    Infusions: . sodium chloride    . sodium chloride 10 mL/hr at 02/23/18 1131  . sodium chloride    . sodium chloride    . milrinone 0.25 mcg/kg/min (02/25/18 0529)    PRN Medications: sodium chloride, sodium chloride, sodium chloride, acetaminophen, methocarbamol, ondansetron (ZOFRAN) IV, sodium chloride flush, sodium chloride flush, sodium chloride flush   Assessment/Plan   1. Acute on chronic systolic CHF: Ischemic cardiomyopathy. 12/2017 Echo, EF 35-40%, stable. [employer] CRT-D but LV lead turned off due to high threshold.  11/20109 Unable to place epicardial lead surgically. PADP 34 on Cardiomems on 12/5 and has been trending up. Creatinine worsening, up to 2.87 12/5.  Patient was admitted after [place] on 12/6 showed low output with CI 1.9 and elevated filling pressures.  PICC placed and started on milrinone 0.25 with IV Lasix.  Echo this admission with stable EF 35-40%.  CXR showed pulmonary edema.  This morning, co-ox 84% and CVP 14-15.  He says he feels better.  Creatinine fairly stable at 2.66.  - Lasix 80 mg IV bid + metolazone 2.5 mg po x 1 today.  - Continue milrinone 0.25 mcg/kg/min for now.  - Hold Entresto and digoxin with AKI.  - Unable to take eplerenone or spironolactone due to breast tenderness.  This has mostly resolved off these meds.   - Decreased coreg to 6.25 mg BID with low output.  - Increase hydralazine to 25 mg tid and continue Imdur 30.  He is also on amlodipine.    2. Atrial fibrillation: Permanent.  Heart rate mildly elevated on milrinone.  - Continue warfarin (hold until INR comes down).   - Continue Coreg for rate control  at lower dose.  3. CAD: s/p CABG.  No recent chest pain.  - No ASA given warfarin use.  - Continue statin.  Good lipids 4/19.   4. AKI on CKD Stage III:  Stopped digoxin and Entresto.  Creatinine currently is at a plateau.  Maintain cardiac output for now with milrinone while diuresing.  5. Gout: stable. Continue allopurinol.  6. Ascending aortic aneurysm: 5/19 CT chest with 5.2 cm ascending aorta.  He saw Dr. Servando Snare.  Aneurysm has been relatively stable, would be high  risk for operation.   Length of Stay: 2  Loralie Champagne, MD  02/25/2018, 7:39 AM  Advanced Heart Failure Team Pager [phone] (M-F; 7a - 4p)  Please contact [facility] for night-coverage after hours (4p -7a ) and weekends on [email or web address]

## 2018-02-26 ENCOUNTER — Encounter (HOSPITAL_COMMUNITY): Payer: Self-pay | Admitting: Cardiology

## 2018-02-26 LAB — CBC WITH DIFFERENTIAL/PLATELET
Abs Immature Granulocytes: 0.01 10*3/uL (ref 0.00–0.07)
Basophils Absolute: 0 10*3/uL (ref 0.0–0.1)
Basophils Relative: 0 %
Eosinophils Absolute: 0.2 10*3/uL (ref 0.0–0.5)
Eosinophils Relative: 3 %
HCT: 37.2 % — ABNORMAL LOW (ref 39.0–52.0)
Hemoglobin: 11.7 g/dL — ABNORMAL LOW (ref 13.0–17.0)
Immature Granulocytes: 0 %
Lymphocytes Relative: 14 %
Lymphs Abs: 0.7 10*3/uL (ref 0.7–4.0)
MCH: 28.7 pg (ref 26.0–34.0)
MCHC: 31.5 g/dL (ref 30.0–36.0)
MCV: 91.4 fL (ref 80.0–100.0)
MONO ABS: 0.6 10*3/uL (ref 0.1–1.0)
Monocytes Relative: 13 %
Neutro Abs: 3.2 10*3/uL (ref 1.7–7.7)
Neutrophils Relative %: 70 %
Platelets: 180 10*3/uL (ref 150–400)
RBC: 4.07 MIL/uL — AB (ref 4.22–5.81)
RDW: 15.1 % (ref 11.5–15.5)
WBC: 4.7 10*3/uL (ref 4.0–10.5)
nRBC: 0 % (ref 0.0–0.2)

## 2018-02-26 LAB — BASIC METABOLIC PANEL
Anion gap: 12 (ref 5–15)
BUN: 47 mg/dL — ABNORMAL HIGH (ref 8–23)
CHLORIDE: 97 mmol/L — AB (ref 98–111)
CO2: 27 mmol/L (ref 22–32)
Calcium: 8.7 mg/dL — ABNORMAL LOW (ref 8.9–10.3)
Creatinine, Ser: 2.81 mg/dL — ABNORMAL HIGH (ref 0.61–1.24)
GFR calc Af Amer: 24 mL/min — ABNORMAL LOW (ref >60)
GFR calc non Af Amer: 21 mL/min — ABNORMAL LOW (ref >60)
Glucose, Bld: 146 mg/dL — ABNORMAL HIGH (ref 70–99)
Potassium: 3.4 mmol/L — ABNORMAL LOW (ref 3.5–5.1)
Sodium: 136 mmol/L (ref 135–145)

## 2018-02-26 LAB — COOXEMETRY PANEL
Carboxyhemoglobin: 1.3 % (ref 0.5–1.5)
Methemoglobin: 1.8 % — ABNORMAL HIGH (ref 0.0–1.5)
O2 Saturation: 73.9 %
Total hemoglobin: 12.1 g/dL (ref 12.0–16.0)

## 2018-02-26 LAB — GLUCOSE, CAPILLARY
Glucose-Capillary: 119 mg/dL — ABNORMAL HIGH (ref 70–99)
Glucose-Capillary: 127 mg/dL — ABNORMAL HIGH (ref 70–99)
Glucose-Capillary: 148 mg/dL — ABNORMAL HIGH (ref 70–99)
Glucose-Capillary: 143 mg/dL — ABNORMAL HIGH (ref 70–99)

## 2018-02-26 LAB — PROTIME-INR
INR: 2.87
Prothrombin Time: 29.6 seconds — ABNORMAL HIGH (ref 11.4–15.2)

## 2018-02-26 MED ORDER — WARFARIN SODIUM 2 MG PO TABS
2.0000 mg | ORAL_TABLET | Freq: Once | ORAL | Status: AC
  Administered 2018-02-26: 2 mg via ORAL
  Filled 2018-02-26: qty 1

## 2018-02-26 MED ORDER — TORSEMIDE 20 MG PO TABS
100.0000 mg | ORAL_TABLET | Freq: Two times a day (BID) | ORAL | Status: DC
  Administered 2018-02-26 – 2018-02-28 (×5): 100 mg via ORAL
  Filled 2018-02-26 (×5): qty 5

## 2018-02-26 MED ORDER — WARFARIN - PHARMACIST DOSING INPATIENT: Freq: Every day | Status: DC

## 2018-02-26 NOTE — Progress Notes (Signed)
CARDIAC REHAB PHASE I   PRE:  Rate/Rhythm: 90 Afib  BP:  Sitting: 123/72      SaO2: 100 on 3L  MODE:  Ambulation: 240 ft 138 peak HR  POST:  Rate/Rhythm: 100 Afib with PVCs  BP:  Sitting: 120/76    SaO2: 94 RA   Pt ambulated 210ft in hallway assist of one with gait belt and cane. Pt took two standing rest breaks. Pt declined use of O2 while ambulating, maintained sats >90. Pt c/o SOB and back pain at end of walk. Pt returned to bed. HF booklet given to pt and reviewed with pt and wife. Wife requesting low sodium diet, diet sheets also given. Encouraged pt to ambulate as able. Pt states back pain is a major limiting factor to activity.   [id number] Rufina Falco, RN BSN 02/26/2018 10:20 AM

## 2018-02-26 NOTE — Progress Notes (Signed)
[place] for warfarin Indication: atrial fibrillation  Allergies  Allergen Reactions  . Avelox [Moxifloxacin Hcl In Nacl] Swelling, Rash and Other (See Comments)    Patient became hypotensive after infusion started Because of a history of documented adverse serious drug reaction;Medi Alert bracelet  is recommended  . Other Rash and Swelling    Patient became hypotensive after infusion started Because of a history of documented adverse serious drug reaction;Medi Alert braceletis recommended  . Penicillins Anaphylaxis, Swelling and Other (See Comments)    Because of a history of documented adverse serious drug reaction;Medi Alert bracelet  is recommended Has patient had a PCN reaction causing immediate rash, facial/tongue/throat swelling, SOB or lightheadedness with hypotension: Yes Has patient had a PCN reaction causing severe rash involving mucus membranes or skin necrosis: No Has patient had a PCN reaction that required hospitalization: Unknown Has patient had a PCN reaction occurring within the last 10 years: No    Patient Measurements: Height: 6' (182.9 cm) Weight: 218 lb 9.6 oz (99.2 kg) IBW/kg (Calculated) : 77.6  Vital Signs: Temp: 97.7 F (36.5 C) (12/09 0904) Temp Source: Oral (12/09 0904) BP: 111/67 (12/09 0904) Pulse Rate: 62 (12/09 0904)  Labs: Recent Labs    02/23/18 1756 02/24/18 0529 02/25/18 0515 02/26/18 0456  HGB 12.8*  --  11.2* 11.7*  HCT 41.3  --  35.4* 37.2*  PLT 220  --  164 180  LABPROT 35.7* 36.8* 34.2* 29.6*  INR 3.64 3.79 3.44 2.87  CREATININE 2.72* 2.63* 2.66* 2.81*    Estimated Creatinine Clearance: 27.3 mL/min (A) (by C-G formula based on SCr of 2.81 mg/dL (H)).   Medical History: Past Medical History:  Diagnosis Date  . AAA (abdominal aortic aneurysm) ([place])    Surgical repair 11/2002.  . Adenomatous colon polyp   . AICD (automatic cardioverter/defibrillator) present   . Alcohol ingestion of  more than four drinks per week    Excess beer  not a dependency problem  . Aortic valve sclerosis   . Arthritis   . Atrial fibrillation ([place])    Previous long-term amiodarone therapy with multiple cardioversions / amiodarone stopped September, 2009  . Atrial flutter [facility])    Started November, 2010, Left-sided and cannot ablate  . Bony abnormality    Patient's manubrium is slightly displaced to the right  . CAD (coronary artery disease)    a. s/p CABG 2004;  b. 02/2016 Cath: LM nl, LAD 70p, 118m, D1 50, D2 50, RI 50ost, LCX 75m, OM2 100, OM3 100, RCA 70p, 100d, VG->OM3 ok, LIMA->LAD 60ost, VG->RPDA  ok.  . 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 01/2013 MDT DTBA 1D1 [place] [employer] CRT-D, ser # [id number] H.  . Carotid artery disease ([place])    Doppler, December, 2013, 0-39% bilateral  . Chronic systolic CHF (congestive heart failure) ([place])    a. 02/2016 Echo: EF 35-40%, diff HK, triv AI, mildly dil Ao root 31mm, mild MR, sev dil LA, triv TR.  Marland Kitchen Chronotropic incompetence    IV pacing rate adjusted  . CKD (chronic kidney disease), stage III ([place])   . COPD (chronic obstructive pulmonary disease) ([place])   . Dilated aortic root ([place])   . Discolored skin   . Diverticulosis   . Drug therapy    Redness and swelling with Avelox infusion May 24, 2011  . Dyspnea   . Eye abnormality    Ophthalmologist questions a clot in one of his eyes, May, 2012  .  Gout   . Hyperlipidemia   . Hypertension   . Internal hemorrhoids   . Ischemic cardiomyopathy   . Left atrial thrombus    Remote past... cardioversions done since that time  . Mitral regurgitation    Mild echo  . Myocardial infarction ([place])   . Nasal drainage    Chronic  . Overweight(278.02)   . Pericardial effusion   . Pleural effusion    Large loculated effusion on the left side November, 2011. This was tapped. It was exudative. Cytology revealed no cancer no proof of mesothelioma area pulmonary team felt that no  further workup was needed  . Pleural thickening   . Pneumonia   . Polycythemia vera ([place]) 07/28/2014  . Presence of permanent cardiac pacemaker   . SOB (shortness of breath)    Large left effusion/ thoracentesis/hospitalization/November, 2011... exudated.. cytology negative.. Dr.Wert.. no proof of mesothelioma  . Spinal stenosis    Surgery Dr.Elsner  . Thrombophlebitis of superficial veins of upper extremities    Possible venous stenosis from defibrillator  . Type II diabetes mellitus ([place]) 2008  . Venous insufficiency    Toe discoloration chronic  . Ventral hernia    April, 2014, result of his abdominal surgery  . Warfarin anticoagulation   . Wide-complex tachycardia ([place])     Assessment: 13 yoM admitted for RHC found to have volume overload and low output. Pt on warfarin PTA for hx of PAF with last dose 12/4 (pt advised to hold on 12/5 after clinic visit).   INR on admit 3.95, home regimen is 2.5mg  daily except 5mg  Tuesday.  INR down to 2.8, trending down today. Hg stable 11s, plt wnl. No active bleed issues documented.   Goal of Therapy:  INR 2-3 Monitor platelets by anticoagulation protocol: Yes   Plan:  -Will give 2mg  of warfarin tonight -Monitor daily INR, CBC, s/sx bleeding  Erin Hearing PharmD., BCPS Clinical Pharmacist 02/26/2018 10:13 AM

## 2018-02-26 NOTE — Progress Notes (Signed)
Patient ID: [name], male   DOB: [birth year], 77 y.o.   MRN: [id number]     Advanced Heart Failure Rounding Note  PCP-Cardiologist: No primary care provider on file.   Subjective:    He feels better than prior to admission, not coughing.  CVP down to 12.  Co-ox 74% on milrinone 0.25.  Creatinine higher this morning 2.87 => 2.72 => 2.6 => 2.66 => 2.81.   Echo: Ef 35-40%, mild LV dilation, mild AI, mild MR, normal RV  RHC Procedural Findings: Hemodynamics (mmHg) RA mean 12 RV 58/10 PA 61/29, mean 40 PCWP mean 27 Oxygen saturations: PA 55% AO 95% Cardiac Output (Fick) 4.2  Cardiac Index (Fick) 1.91 PVR 3 WU   Objective:   Weight Range: 99.2 kg Body mass index is 29.65 kg/m.   Vital Signs:   Temp:  [97.3 F (36.3 C)-98.1 F (36.7 C)] 97.6 F (36.4 C) (12/09 0414) Pulse Rate:  [52-122] 100 (12/09 0414) Resp:  [16-20] 16 (12/09 0414) BP: (105-123)/(67-89) 123/79 (12/09 0414) SpO2:  [87 %-100 %] 100 % (12/09 0414) Weight:  [99.2 kg] 99.2 kg (12/09 0414) Last BM Date: 02/25/18  Weight change: Filed Weights   02/24/18 0412 02/25/18 0623 02/26/18 0414  Weight: 98.1 kg 100 kg 99.2 kg    Intake/Output:   Intake/Output Summary (Last 24 hours) at 02/26/2018 0742 Last data filed at 02/26/2018 0700 Gross per 24 hour  Intake 1258.34 ml  Output 2400 ml  Net -1141.66 ml      Physical Exam    General: NAD Neck: JVP 9-10, no thyromegaly or thyroid nodule.  Lungs: Clear to auscultation bilaterally with normal respiratory effort. CV: Nondisplaced PMI.  Heart irregular S1/S2, no S3/S4, no murmur.  1+ ankle edema.   Abdomen: Soft, nontender, no hepatosplenomegaly, no distention.  Skin: Intact without lesions or rashes.  Neurologic: Alert and oriented x 3.  Psych: Normal affect. Extremities: No clubbing or cyanosis.  HEENT: Normal.    Telemetry   Atrial fibrillation with occasional PVCs, wide QRS complex, HR 90s (personally reviewed).    Labs     CBC Recent Labs    02/25/18 0515 02/26/18 0456  WBC 4.6 4.7  NEUTROABS 3.4 3.2  HGB 11.2* 11.7*  HCT 35.4* 37.2*  MCV 92.2 91.4  PLT 164 916   Basic Metabolic Panel Recent Labs    02/25/18 0515 02/26/18 0456  NA 138 136  K 3.8 3.4*  CL 102 97*  CO2 26 27  GLUCOSE 155* 146*  BUN 43* 47*  CREATININE 2.66* 2.81*  CALCIUM 8.5* 8.7*   Liver Function Tests No results for input(s): AST, ALT, ALKPHOS, BILITOT, PROT, ALBUMIN in the last 72 hours. No results for input(s): LIPASE, AMYLASE in the last 72 hours. Cardiac Enzymes No results for input(s): CKTOTAL, CKMB, CKMBINDEX, TROPONINI in the last 72 hours.  BNP: BNP (last 3 results) Recent Labs    12/29/17 1048 02/22/18 1215  BNP 1,274.1* 2,025.5*    ProBNP (last 3 results) No results for input(s): PROBNP in the last 8760 hours.   D-Dimer No results for input(s): DDIMER in the last 72 hours. Hemoglobin A1C No results for input(s): HGBA1C in the last 72 hours. Fasting Lipid Panel No results for input(s): CHOL, HDL, LDLCALC, TRIG, CHOLHDL, LDLDIRECT in the last 72 hours. Thyroid Function Tests No results for input(s): TSH, T4TOTAL, T3FREE, THYROIDAB in the last 72 hours.  Invalid input(s): FREET3  Other results:   Imaging    No results found.  Medications:     Scheduled Medications: . allopurinol  200 mg Oral QPM  . amLODipine  2.5 mg Oral Daily  . atorvastatin  10 mg Oral q1800  . carvedilol  6.25 mg Oral BID WC  . cholecalciferol  1,000 Units Oral Daily  . DULoxetine  30 mg Oral Daily  . gabapentin  600 mg Oral TID  . hydrALAZINE  25 mg Oral Q8H  . insulin aspart  0-5 Units Subcutaneous QHS  . insulin aspart  0-9 Units Subcutaneous TID WC  . insulin NPH Human  6 Units Subcutaneous QHS  . insulin NPH Human  8 Units Subcutaneous QAC breakfast  . ipratropium  2 spray Each Nare Daily  . isosorbide mononitrate  30 mg Oral Daily  . pantoprazole  80 mg Oral Daily  . polyvinyl alcohol  2 drop Both  Eyes Daily  . potassium chloride SA  40 mEq Oral Daily  . sodium chloride flush  3 mL Intravenous Q12H  . sodium chloride flush  3 mL Intravenous Q12H  . sodium chloride flush  3 mL Intravenous Q12H  . torsemide  100 mg Oral BID    Infusions: . sodium chloride    . sodium chloride 10 mL/hr at 02/23/18 1131  . sodium chloride    . sodium chloride    . milrinone 0.25 mcg/kg/min (02/25/18 1700)    PRN Medications: sodium chloride, sodium chloride, sodium chloride, acetaminophen, methocarbamol, ondansetron (ZOFRAN) IV, sodium chloride flush, sodium chloride flush, sodium chloride flush   Assessment/Plan   1. Acute on chronic systolic CHF: Ischemic cardiomyopathy. 12/2017 Echo, EF 35-40%, stable. Medtronic CRT-D but LV lead turned off due to high threshold.  11/20109 Unable to place epicardial lead surgically. PADP 34 on Cardiomems on 12/5 and has been trending up. Creatinine worsening, up to 2.87 12/5.  Patient was admitted after [place] on 12/6 showed low output with CI 1.9 and elevated filling pressures.  PICC placed and started on milrinone 0.25 with IV Lasix.  Echo this admission with stable EF 35-40%.  CXR showed pulmonary edema.  This morning, co-ox 74% and CVP down to 12. He diuresed well yesterday, weight down 2 lbs but creatinine up to 2.81.  He says he feels better.   - No IV diuretics today, will resume po torsemide 100 mg bid (home dose) with creatinine rise.  - Continue milrinone 0.25 mcg/kg/min today with creatinine rise.  Will aim for starting wean tomorrow.  - Hold Entresto and digoxin with AKI.  - Unable to take eplerenone or spironolactone due to breast tenderness.  This has mostly resolved off these meds.   - Decreased coreg to 6.25 mg BID with low output.  - Continue hydralazine 25 mg tid and continue Imdur 30.  Can stop amlodipine to give more BP room.    2. Atrial fibrillation: Permanent.  Heart rate normal to mildly elevated on milrinone.  - Continue warfarin (INR down to  2.8).    - Continue Coreg for rate control at lower dose.  3. CAD: s/p CABG.  No recent chest pain.  - No ASA given warfarin use.  - Continue statin.  Good lipids 4/19.   4. AKI on CKD Stage III:  Stopped digoxin and Entresto.  Creatinine higher today at 2.8.  Will stop IV diuretics today.  CVP 12 so will restart home torsemide 100 mg bid.  Continue milrinone today.  5. Gout: stable. Continue allopurinol.  6. Ascending aortic aneurysm: 5/19 CT chest with 5.2 cm ascending aorta.  He saw Dr. Servando Snare.  Aneurysm has been relatively stable, would be high risk for operation.   Length of Stay: 3  Loralie Champagne, MD  02/26/2018, 7:42 AM  Advanced Heart Failure Team Pager [phone] (M-F; 7a - 4p)  Please contact [facility] for night-coverage after hours (4p -7a ) and weekends on [email or web address]

## 2018-02-26 NOTE — Discharge Summary (Signed)
Advanced Heart Failure Discharge Note  Discharge Summary   Patient ID: [name] MRN: [id number], DOB/AGE: 1941/02/16 77 y.o. Admit date: 02/23/2018 D/C date:     02/28/2018    Primary Discharge Diagnoses:  1. A/C systolic HF due to ICM - MDT CRT-D (LV lead off due to high threshold) - Has cardiomems - Required milrinone for diuresis 2. Atrial fibrillation: Permanent.  - On coumadin, followed by coumadin clinic on [facility] 3. CAD: s/p CABG 4.AKI onCKD Stage III 5. Gout 6. Ascending aortic aneurysm  Hospital Course:  [name] is a 77 y.o. with history of CAD s/p CABG, permanent atrial fib, S/P AAA repair, ischemic cardiomyopathy/chronic systolic heart failure, MDT CRT-D with LV lead off due to high threshold, s/p cardiomems.   He was seen in HF clinic 12/5 and was volume overloaded with AKI. He was set up for [facility] on 12/6 and was admitted with low output and volume overload. Per Dr Aundra Dubin, cardiomems and [place] numbers correlated. PICC was placed and he was started on milrinone and IV lasix. Transitioned to torsemide 100 mg BID and added metolazone 2.5 mg weekly. Milrinone slowly weaned off with adequate coox. Renal function monitored closely. Creatinine peaked at 2.87 and came back to 2.63 by day of discharge. Entresto and digoxin stopped with AKI. Started on hydralazine and imdur. Coreg dose was decreased.   1.Acute on chronic systolic CHF: Ischemic cardiomyopathy. 12/2017 Echo, EF 35-40%, stable. Medtronic CRT-D but LV lead turned off due to high threshold. 11/20109 Unable to place epicardial lead surgically. PADP 34 on Cardiomems on 12/5 and has been trending up.Creatinine worsening, up to 2.87 12/5.  Patient was admitted after [place] on 12/6 showed low output with CI 1.9 and elevated filling pressures.  PICC placed and started on milrinone 0.25 with IV Lasix.  Echo this admission with stable EF 35-40%.   - Diuresed with IV lasix, then transitioned to torsemide 100 mg BID with  metolazone 2.5 mg once weekly on Mondays - Required milrinone 0.125 mcg/kg/min with low output on cath. After diuresis, was slowly weaned off. Coox remained stable.  Delene Loll and digoxin were held with AKI.  - Unable to take eplerenone or spironolactone due to breast tenderness and CKD. This has mostly resolved off these meds.  - Coreg was decreased from 25 mg BID to 6.25 mg BID with low output. - Continue hydralazine 37.5 mg tid and continue Imdur 60 mg daily.  Amlodipine stopped to allow more BP room for HF medications. 2. Atrial fibrillation: Permanent.   -Continued warfarin. He will make INR appointment early next week. - Continued Coreg for rate control at lower dose. 3. CAD: s/p CABG. No s/s ischemia - No ASA given warfarin use.  - Continue statin. Good lipids 4/19.  4.AKI onCKD Stage III: Stopped digoxin and Entresto.Creatinine monitored closely. Creatinine peaked at 2.87 and came down to 2.63 by day of discharge.  5. Gout: stable. Continue allopurinol.  6. Ascending aortic aneurysm: 5/19 CT chest with 5.2 cm ascending aorta. He saw Dr. Servando Snare. Aneurysm has been relatively stable, would be high risk for operation.   He will be followed closely in HF clinic, with appointment as below. He will need a BMET at follow up. He requested that his wife make INR appointment early next week at [facility]. He wears O2 PRN at home and will resume this.     Discharge Weight Range: 214 lbs Discharge Vitals: Blood pressure (!) 127/95, pulse (!) 107, temperature 100.1 F (  37.8 C), temperature source Oral, resp. rate (!) 24, height 6' (1.829 m), weight 97 kg, SpO2 98 %.  Labs: Lab Results  Component Value Date   WBC 5.4 02/28/2018   HGB 12.1 (L) 02/28/2018   HCT 37.2 (L) 02/28/2018   MCV 90.7 02/28/2018   PLT 162 02/28/2018    Recent Labs  Lab 02/28/18 0526  NA 135  K 4.3  CL 93*  CO2 29  BUN 43*  CREATININE 2.63*  CALCIUM 8.9  GLUCOSE 103*   Lab Results  Component  Value Date   CHOL 119 07/03/2017   HDL 41.60 07/03/2017   LDLCALC 52 07/03/2017   TRIG 127.0 07/03/2017   BNP (last 3 results) Recent Labs    12/29/17 1048 02/22/18 1215  BNP 1,274.1* 2,025.5*    ProBNP (last 3 results) No results for input(s): PROBNP in the last 8760 hours.   Diagnostic Studies/Procedures   RHC 02/23/2018: Hemodynamics (mmHg) RA mean 12 RV 58/10 PA 61/29, mean 40 PCWP mean 27 Oxygen saturations: PA 55% AO 95% Cardiac Output (Fick) 4.2  Cardiac Index (Fick) 1.91 PVR 3 WU  Echo 02/24/18: - Left ventricle: The cavity size was mildly dilated. There was   moderate concentric hypertrophy. Systolic function was moderately   reduced. The estimated ejection fraction was in the range of 35%   to 40%. Diffuse hypokinesis. - Ventricular septum: Septal motion showed dyssynchrony. - Aortic valve: Transvalvular velocity was within the normal range.   There was no stenosis. There was mild regurgitation. - Aorta: Ascending aortic diameter: 39 mm (S). - Ascending aorta: The ascending aorta was mildly dilated. - Mitral valve: Transvalvular velocity was within the normal range.   There was no evidence for stenosis. There was mild regurgitation. - Left atrium: The atrium was severely dilated. - Right ventricle: The cavity size was normal. Wall thickness was   normal. Systolic function was normal. - Right atrium: The atrium was severely dilated. - Atrial septum: A patent foramen ovale cannot be excluded. - Tricuspid valve: There was trivial regurgitation. - Pulmonary arteries: Systolic pressure was within the normal   range. PA peak pressure: 37 mm Hg (S).  Discharge Medications   Allergies as of 02/28/2018      Reactions   Avelox [moxifloxacin Hcl In Nacl] Swelling, Rash, Other (See Comments)   Patient became hypotensive after infusion started Because of a history of documented adverse serious drug reaction;Medi Alert bracelet  is recommended   Other Rash,  Swelling   Patient became hypotensive after infusion started Because of a history of documented adverse serious drug reaction;Medi Alert braceletis recommended   Penicillins Anaphylaxis, Swelling, Other (See Comments)   Because of a history of documented adverse serious drug reaction;Medi Alert bracelet  is recommended Has patient had a PCN reaction causing immediate rash, facial/tongue/throat swelling, SOB or lightheadedness with hypotension: Yes Has patient had a PCN reaction causing severe rash involving mucus membranes or skin necrosis: No Has patient had a PCN reaction that required hospitalization: Unknown Has patient had a PCN reaction occurring within the last 10 years: No      Medication List    STOP taking these medications   amLODipine 5 MG tablet Commonly known as:  NORVASC   digoxin 0.125 MG tablet Commonly known as:  LANOXIN   sacubitril-valsartan 24-26 MG Commonly known as:  ENTRESTO     TAKE these medications   ACCU-CHEK AVIVA PLUS test strip Generic drug:  glucose blood USE 1 STRIP TO CHECK GLUCOSE THREE  TIMES DAILY AS DIRECTED   ACCU-CHEK FASTCLIX LANCETS Misc USE 1 LANCET TO CHECK GLUCOSE THREE TIMES DAILY   acetaminophen 325 MG tablet Commonly known as:  TYLENOL Take 650-975 mg by mouth 3 (three) times daily as needed for moderate pain.   allopurinol 100 MG tablet Commonly known as:  ZYLOPRIM TAKE TWO TABLETS BY MOUTH ONCE DAILY What changed:  when to take this   atorvastatin 10 MG tablet Commonly known as:  LIPITOR Take 1 tablet (10 mg total) by mouth daily.   carboxymethylcellulose 0.5 % Soln Commonly known as:  REFRESH PLUS Place 2 drops into both eyes daily.   carvedilol 6.25 MG tablet Commonly known as:  COREG Take 1 tablet (6.25 mg total) by mouth 2 (two) times daily with a meal. What changed:    medication strength  See the new instructions.   cholecalciferol 25 MCG (1000 UT) tablet Commonly known as:  VITAMIN D Take 1,000 Units  by mouth daily.   colchicine 0.6 MG tablet Take 0.6 mg by mouth daily as needed (gout flares).   DULoxetine 30 MG capsule Commonly known as:  CYMBALTA TAKE 1 CAPSULE BY MOUTH ONCE DAILY   gabapentin 300 MG capsule Commonly known as:  NEURONTIN Take 2 capsules (600 mg total) by mouth 3 (three) times daily. TAKE ONE CAPSULE BY MOUTH 5 TIMES DAILY AS NEEDED FOR LEG PAIN What changed:    how much to take  additional instructions   hydrALAZINE 25 MG tablet Commonly known as:  APRESOLINE Take 1.5 tablets (37.5 mg total) by mouth every 8 (eight) hours.   ipratropium 0.06 % nasal spray Commonly known as:  ATROVENT Place 2 sprays into both nostrils daily.   isosorbide mononitrate 60 MG 24 hr tablet Commonly known as:  IMDUR Take 1 tablet (60 mg total) by mouth daily. Start taking on:  03/01/2018   methocarbamol 750 MG tablet Commonly known as:  ROBAXIN Take 1 tablet (750 mg total) by mouth 2 (two) times daily as needed for muscle spasms.   metolazone 2.5 MG tablet Commonly known as:  ZAROXOLYN Take once weekly on Mondays. What changed:  additional instructions   metroNIDAZOLE 0.75 % gel Commonly known as:  METROGEL Apply 1 application topically daily as needed (rosacea).   nitroGLYCERIN 0.4 MG SL tablet Commonly known as:  NITROSTAT Place 1 tablet (0.4 mg total) under the tongue every 5 (five) minutes as needed for chest pain (MAX 3 TABLETS).   NOVOLIN N 100 UNIT/ML injection Generic drug:  insulin NPH Human Inject 6-8 Units into the skin See admin instructions. Inject 8 units subcutaneously in the morning and inject 6 units subcutaneously in the evening.   NOVOLIN R 100 units/mL injection Generic drug:  insulin regular Inject 6 Units into the skin daily before supper.   omeprazole 20 MG capsule Commonly known as:  PRILOSEC Take 2 capsules (40 mg total) by mouth 2 (two) times daily. What changed:  when to take this   potassium chloride SA 20 MEQ tablet Commonly  known as:  K-DUR,KLOR-CON Take 2 tablets (40 mEq total) by mouth daily.   torsemide 100 MG tablet Commonly known as:  DEMADEX Take 1 tablet (100 mg total) by mouth 2 (two) times daily.   warfarin 2.5 MG tablet Commonly known as:  COUMADIN Take as directed. If you are unsure how to take this medication, talk to your nurse or doctor. Original instructions:  Take 5 mg on Wednesday, 12/11. TAKE 2 TABLETS (5 MG) BY MOUTH ON  TUESDAYS, THEN TAKE 1 TABLET (2.5 MG) BY MOUTH ON ALL OTHER DAYS. What changed:  additional instructions       Disposition   The patient will be discharged in stable condition to home. Discharge Instructions    (HEART FAILURE PATIENTS) Call MD:  Anytime you have any of the following symptoms: 1) 3 pound weight gain in 24 hours or 5 pounds in 1 week 2) shortness of breath, with or without a dry hacking cough 3) swelling in the hands, feet or stomach 4) if you have to sleep on extra pillows at night in order to breathe.   Complete by:  As directed    Call MD for:  persistant dizziness or light-headedness   Complete by:  As directed    Diet - low sodium heart healthy   Complete by:  As directed    Discharge instructions   Complete by:  As directed    Please make INR appointment for early next week   Heart Failure patients record your daily weight using the same scale at the same time of day   Complete by:  As directed    Increase activity slowly   Complete by:  As directed    STOP any activity that causes chest pain, shortness of breath, dizziness, sweating, or exessive weakness   Complete by:  As directed      Follow-up Information    [place] Go on 03/09/2018.   Specialty:  Cardiology Why:  at 11AM in the [facility].  Please bring all medications to appt. Gate code is 1900 for December.  Contact information: [address] [id number] [place] [phone]              Duration of Discharge Encounter: Greater than 35 minutes   Signed, Georgiana Shore, NP 02/28/2018, 9:53 AM

## 2018-02-27 LAB — CBC WITH DIFFERENTIAL/PLATELET
Abs Immature Granulocytes: 0.02 10*3/uL (ref 0.00–0.07)
BASOS ABS: 0 10*3/uL (ref 0.0–0.1)
Basophils Relative: 0 %
Eosinophils Absolute: 0.1 10*3/uL (ref 0.0–0.5)
Eosinophils Relative: 2 %
HCT: 34.2 % — ABNORMAL LOW (ref 39.0–52.0)
Hemoglobin: 11.3 g/dL — ABNORMAL LOW (ref 13.0–17.0)
IMMATURE GRANULOCYTES: 0 %
Lymphocytes Relative: 14 %
Lymphs Abs: 0.7 10*3/uL (ref 0.7–4.0)
MCH: 29.4 pg (ref 26.0–34.0)
MCHC: 33 g/dL (ref 30.0–36.0)
MCV: 89.1 fL (ref 80.0–100.0)
Monocytes Absolute: 0.7 10*3/uL (ref 0.1–1.0)
Monocytes Relative: 15 %
NRBC: 0 % (ref 0.0–0.2)
Neutro Abs: 3.4 10*3/uL (ref 1.7–7.7)
Neutrophils Relative %: 69 %
PLATELETS: 165 10*3/uL (ref 150–400)
RBC: 3.84 MIL/uL — ABNORMAL LOW (ref 4.22–5.81)
RDW: 14.7 % (ref 11.5–15.5)
WBC: 5 10*3/uL (ref 4.0–10.5)

## 2018-02-27 LAB — BASIC METABOLIC PANEL
ANION GAP: 15 (ref 5–15)
BUN: 45 mg/dL — ABNORMAL HIGH (ref 8–23)
CO2: 28 mmol/L (ref 22–32)
Calcium: 8.6 mg/dL — ABNORMAL LOW (ref 8.9–10.3)
Chloride: 93 mmol/L — ABNORMAL LOW (ref 98–111)
Creatinine, Ser: 2.8 mg/dL — ABNORMAL HIGH (ref 0.61–1.24)
GFR calc Af Amer: 24 mL/min — ABNORMAL LOW (ref >60)
GFR, EST NON AFRICAN AMERICAN: 21 mL/min — AB (ref >60)
Glucose, Bld: 98 mg/dL (ref 70–99)
Potassium: 2.8 mmol/L — ABNORMAL LOW (ref 3.5–5.1)
Sodium: 136 mmol/L (ref 135–145)

## 2018-02-27 LAB — COOXEMETRY PANEL
Carboxyhemoglobin: 1.3 % (ref 0.5–1.5)
Carboxyhemoglobin: 1.5 % (ref 0.5–1.5)
Methemoglobin: 1.2 % (ref 0.0–1.5)
Methemoglobin: 1.2 % (ref 0.0–1.5)
O2 Saturation: 50.7 %
O2 Saturation: 69.7 %
TOTAL HEMOGLOBIN: 12.8 g/dL (ref 12.0–16.0)
Total hemoglobin: 12.9 g/dL (ref 12.0–16.0)

## 2018-02-27 LAB — PROTIME-INR
INR: 2.52
Prothrombin Time: 26.8 seconds — ABNORMAL HIGH (ref 11.4–15.2)

## 2018-02-27 MED ORDER — WARFARIN SODIUM 2 MG PO TABS
3.0000 mg | ORAL_TABLET | Freq: Once | ORAL | Status: AC
  Administered 2018-02-27: 3 mg via ORAL
  Filled 2018-02-27: qty 1

## 2018-02-27 MED ORDER — POTASSIUM CHLORIDE CRYS ER 20 MEQ PO TBCR
40.0000 meq | EXTENDED_RELEASE_TABLET | Freq: Once | ORAL | Status: AC
  Administered 2018-02-27: 40 meq via ORAL

## 2018-02-27 NOTE — Progress Notes (Signed)
Patient ID: [name], male   DOB: [birth year], 77 y.o.   MRN: [id number]     Advanced Heart Failure Rounding Note  PCP-Cardiologist: No primary care provider on file.   Subjective:    He feels better than prior to admission, not coughing.  CVP down to 9 today.  Co-ox 51% on milrinone 0.25 (but drawn while asleep with presumably low sats).  Creatinine seems to have plateaued 2.87 => 2.72 => 2.6 => 2.66 => 2.81 => 2.8.   Echo: EF 35-40%, mild LV dilation, mild AI, mild MR, normal RV  RHC Procedural Findings: Hemodynamics (mmHg) RA mean 12 RV 58/10 PA 61/29, mean 40 PCWP mean 27 Oxygen saturations: PA 55% AO 95% Cardiac Output (Fick) 4.2  Cardiac Index (Fick) 1.91 PVR 3 WU   Objective:   Weight Range: 97.8 kg Body mass index is 29.24 kg/m.   Vital Signs:   Temp:  [97.7 F (36.5 C)-98.2 F (36.8 C)] 97.8 F (36.6 C) (12/10 0446) Pulse Rate:  [58-119] 75 (12/10 0446) Resp:  [12-20] 15 (12/10 0446) BP: (101-141)/(66-87) 133/70 (12/10 0446) SpO2:  [93 %-97 %] 94 % (12/10 0446) Weight:  [97.8 kg] 97.8 kg (12/10 0445) Last BM Date: 02/25/18  Weight change: Filed Weights   02/25/18 0623 02/26/18 0414 02/27/18 0445  Weight: 100 kg 99.2 kg 97.8 kg    Intake/Output:   Intake/Output Summary (Last 24 hours) at 02/27/2018 0803 Last data filed at 02/27/2018 0456 Gross per 24 hour  Intake 1223.69 ml  Output 3540 ml  Net -2316.31 ml      Physical Exam    General: NAD Neck: JVP 8 cm, no thyromegaly or thyroid nodule.  Lungs: Clear to auscultation bilaterally with normal respiratory effort. CV: Nondisplaced PMI.  Heart mildly tachy, irregular S1/S2, no S3/S4, no murmur.  1+ ankle edema.   Abdomen: Soft, nontender, no hepatosplenomegaly, no distention.  Skin: Intact without lesions or rashes.  Neurologic: Alert and oriented x 3.  Psych: Normal affect. Extremities: No clubbing or cyanosis.  HEENT: Normal.    Telemetry   Atrial fibrillation with occasional  PVCs, wide QRS complex, HR 100s (personally reviewed).    Labs    CBC Recent Labs    02/26/18 0456 02/27/18 0402  WBC 4.7 5.0  NEUTROABS 3.2 3.4  HGB 11.7* 11.3*  HCT 37.2* 34.2*  MCV 91.4 89.1  PLT 180 951   Basic Metabolic Panel Recent Labs    02/26/18 0456 02/27/18 0402  NA 136 136  K 3.4* 2.8*  CL 97* 93*  CO2 27 28  GLUCOSE 146* 98  BUN 47* 45*  CREATININE 2.81* 2.80*  CALCIUM 8.7* 8.6*   Liver Function Tests No results for input(s): AST, ALT, ALKPHOS, BILITOT, PROT, ALBUMIN in the last 72 hours. No results for input(s): LIPASE, AMYLASE in the last 72 hours. Cardiac Enzymes No results for input(s): CKTOTAL, CKMB, CKMBINDEX, TROPONINI in the last 72 hours.  BNP: BNP (last 3 results) Recent Labs    12/29/17 1048 02/22/18 1215  BNP 1,274.1* 2,025.5*    ProBNP (last 3 results) No results for input(s): PROBNP in the last 8760 hours.   D-Dimer No results for input(s): DDIMER in the last 72 hours. Hemoglobin A1C No results for input(s): HGBA1C in the last 72 hours. Fasting Lipid Panel No results for input(s): CHOL, HDL, LDLCALC, TRIG, CHOLHDL, LDLDIRECT in the last 72 hours. Thyroid Function Tests No results for input(s): TSH, T4TOTAL, T3FREE, THYROIDAB in the last 72 hours.  Invalid input(s):  FREET3  Other results:   Imaging    No results found.   Medications:     Scheduled Medications: . allopurinol  200 mg Oral QPM  . atorvastatin  10 mg Oral q1800  . carvedilol  6.25 mg Oral BID WC  . cholecalciferol  1,000 Units Oral Daily  . DULoxetine  30 mg Oral Daily  . gabapentin  600 mg Oral TID  . hydrALAZINE  25 mg Oral Q8H  . insulin aspart  0-5 Units Subcutaneous QHS  . insulin aspart  0-9 Units Subcutaneous TID WC  . insulin NPH Human  6 Units Subcutaneous QHS  . insulin NPH Human  8 Units Subcutaneous QAC breakfast  . ipratropium  2 spray Each Nare Daily  . isosorbide mononitrate  30 mg Oral Daily  . pantoprazole  80 mg Oral Daily    . polyvinyl alcohol  2 drop Both Eyes Daily  . potassium chloride SA  40 mEq Oral Daily  . sodium chloride flush  3 mL Intravenous Q12H  . sodium chloride flush  3 mL Intravenous Q12H  . sodium chloride flush  3 mL Intravenous Q12H  . torsemide  100 mg Oral BID  . Warfarin - Pharmacist Dosing Inpatient   Does not apply q1800    Infusions: . sodium chloride    . sodium chloride 10 mL/hr at 02/23/18 1131  . sodium chloride    . sodium chloride    . milrinone 0.25 mcg/kg/min (02/27/18 0510)    PRN Medications: sodium chloride, sodium chloride, sodium chloride, acetaminophen, methocarbamol, ondansetron (ZOFRAN) IV, sodium chloride flush, sodium chloride flush, sodium chloride flush   Assessment/Plan   1. Acute on chronic systolic CHF: Ischemic cardiomyopathy. 12/2017 Echo, EF 35-40%, stable. Medtronic CRT-D but LV lead turned off due to high threshold.  11/20109 Unable to place epicardial lead surgically. PADP 34 on Cardiomems on 12/5 and has been trending up. Creatinine worsening, up to 2.87 12/5.  Patient was admitted after [place] on 12/6 showed low output with CI 1.9 and elevated filling pressures.  PICC placed and started on milrinone 0.25 with IV Lasix.  Echo this admission with stable EF 35-40%.  CXR showed pulmonary edema.  This morning, co-ox 51% (but drawn early am while asleep with presumably low sats) and CVP down to 9. He diuresed well yesterday even on po torsemide, weight down 3 lbs.  Creatinine stable at 2.8.   - Continue torsemide 100 mg bid (home dose).  - Repeat co-ox.  Will decrease milrinone to 0.125 and aim to stop possibly later today.  - Hold Entresto and digoxin with AKI.  - Unable to take eplerenone or spironolactone due to breast tenderness.  This has mostly resolved off these meds.   - Decreased coreg to 6.25 mg BID with low output.  - Increase hydralazine to 37.5 mg tid and continue Imdur 30 daily.    2. Atrial fibrillation: Permanent.  Heart rate normal to mildly  elevated on milrinone.  - Continue warfarin (INR down to 2.5).    - Continue Coreg for rate control at lower dose.  3. CAD: s/p CABG.  No recent chest pain.  - No ASA given warfarin use.  - Continue statin.  Good lipids 4/19.   4. AKI on CKD Stage III:  Stopped digoxin and Entresto.  Creatinine seems to have plateaued around 2.8.  5. Gout: stable. Continue allopurinol.  6. Ascending aortic aneurysm: 5/19 CT chest with 5.2 cm ascending aorta.  He saw Dr. Servando Snare.  Aneurysm has been relatively stable, would be high risk for operation.   Length of Stay: 4  Loralie Champagne, MD  02/27/2018, 8:03 AM  Advanced Heart Failure Team Pager [phone] (M-F; 7a - 4p)  Please contact [facility] for night-coverage after hours (4p -7a ) and weekends on [email or web address]

## 2018-02-27 NOTE — Progress Notes (Signed)
    Milrinone 0.125 mcg. Feeling ok. Wants to go home.    Stop milrinone and repeat CO-OX in the monring. Possible d/c in am.   Amy Clegg NP-C  4:36 PM

## 2018-02-27 NOTE — Progress Notes (Signed)
CARDIAC REHAB PHASE I   PRE:  Rate/Rhythm: 110 Afib  BP:  Sitting: 115/75      SaO2: 95 RA  MODE:  Ambulation: 240 ft 126 peak HR  POST:  Rate/Rhythm: 103 Afib with PVCs  BP:  Sitting: 123/89    SaO2: 99 2L  Pt ambulated 211ft in hallway assist of one with cane. Pt took one standing rest break, c/o feet discomfort and some SOB. Pt returned to bed. Call bell, lunch tray within reach. Wife at bedside. Will continue to follow.  [phone] Rufina Falco, RN BSN 02/27/2018 11:40 AM

## 2018-02-27 NOTE — Progress Notes (Signed)
[place] for warfarin Indication: atrial fibrillation  Allergies  Allergen Reactions  . Avelox [Moxifloxacin Hcl In Nacl] Swelling, Rash and Other (See Comments)    Patient became hypotensive after infusion started Because of a history of documented adverse serious drug reaction;Medi Alert bracelet  is recommended  . Other Rash and Swelling    Patient became hypotensive after infusion started Because of a history of documented adverse serious drug reaction;Medi Alert braceletis recommended  . Penicillins Anaphylaxis, Swelling and Other (See Comments)    Because of a history of documented adverse serious drug reaction;Medi Alert bracelet  is recommended Has patient had a PCN reaction causing immediate rash, facial/tongue/throat swelling, SOB or lightheadedness with hypotension: Yes Has patient had a PCN reaction causing severe rash involving mucus membranes or skin necrosis: No Has patient had a PCN reaction that required hospitalization: Unknown Has patient had a PCN reaction occurring within the last 10 years: No    Patient Measurements: Height: 6' (182.9 cm) Weight: 215 lb 9.6 oz (97.8 kg) IBW/kg (Calculated) : 77.6  Vital Signs: Temp: 97.8 F (36.6 C) (12/10 1108) Temp Source: Oral (12/10 1108) BP: 124/92 (12/10 1108) Pulse Rate: 90 (12/10 1108)  Labs: Recent Labs    02/25/18 0515 02/26/18 0456 02/27/18 0402  HGB 11.2* 11.7* 11.3*  HCT 35.4* 37.2* 34.2*  PLT 164 180 165  LABPROT 34.2* 29.6* 26.8*  INR 3.44 2.87 2.52  CREATININE 2.66* 2.81* 2.80*    Estimated Creatinine Clearance: 27.2 mL/min (A) (by C-G formula based on SCr of 2.8 mg/dL (H)).   Medical History: Past Medical History:  Diagnosis Date  . AAA (abdominal aortic aneurysm) ([place])    Surgical repair 11/2002.  . Adenomatous colon polyp   . AICD (automatic cardioverter/defibrillator) present   . Alcohol ingestion of more than four drinks per week    Excess beer   not a dependency problem  . Aortic valve sclerosis   . Arthritis   . Atrial fibrillation ([place])    Previous long-term amiodarone therapy with multiple cardioversions / amiodarone stopped September, 2009  . Atrial flutter [facility])    Started November, 2010, Left-sided and cannot ablate  . Bony abnormality    Patient's manubrium is slightly displaced to the right  . CAD (coronary artery disease)    a. s/p CABG 2004;  b. 02/2016 Cath: LM nl, LAD 70p, 120m, D1 50, D2 50, RI 50ost, LCX 43m, OM2 100, OM3 100, RCA 70p, 100d, VG->OM3 ok, LIMA->LAD 60ost, VG->RPDA  ok.  . 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 01/2013 MDT DTBA 1D1 [place] CRT-D, ser # [id number].  . Carotid artery disease ([place])    Doppler, December, 2013, 0-39% bilateral  . Chronic systolic CHF (congestive heart failure) ([place])    a. 02/2016 Echo: EF 35-40%, diff HK, triv AI, mildly dil Ao root 77mm, mild MR, sev dil LA, triv TR.  Marland Kitchen Chronotropic incompetence    IV pacing rate adjusted  . CKD (chronic kidney disease), stage III ([place])   . COPD (chronic obstructive pulmonary disease) ([place])   . Dilated aortic root ([place])   . Discolored skin   . Diverticulosis   . Drug therapy    Redness and swelling with Avelox infusion May 24, 2011  . Dyspnea   . Eye abnormality    Ophthalmologist questions a clot in one of his eyes, May, 2012  . Gout   . Hyperlipidemia   . Hypertension   .  Internal hemorrhoids   . Ischemic cardiomyopathy   . Left atrial thrombus    Remote past... cardioversions done since that time  . Mitral regurgitation    Mild echo  . Myocardial infarction ([place])   . Nasal drainage    Chronic  . Overweight(278.02)   . Pericardial effusion   . Pleural effusion    Large loculated effusion on the left side November, 2011. This was tapped. It was exudative. Cytology revealed no cancer no proof of mesothelioma area pulmonary team felt that no further workup was needed  . Pleural thickening    . Pneumonia   . Polycythemia vera ([place]) 07/28/2014  . Presence of permanent cardiac pacemaker   . SOB (shortness of breath)    Large left effusion/ thoracentesis/hospitalization/November, 2011... exudated.. cytology negative.. Dr.Wert.. no proof of mesothelioma  . Spinal stenosis    Surgery Dr.Elsner  . Thrombophlebitis of superficial veins of upper extremities    Possible venous stenosis from defibrillator  . Type II diabetes mellitus ([place]) 2008  . Venous insufficiency    Toe discoloration chronic  . Ventral hernia    April, 2014, result of his abdominal surgery  . Warfarin anticoagulation   . Wide-complex tachycardia ([place])     Assessment: 11 yoM admitted for RHC found to have volume overload and low output. Pt on warfarin PTA for hx of PAF with last dose 12/4 (pt advised to hold on 12/5 after clinic visit).   INR on admit 3.95, home regimen is 2.5mg  daily except 5mg  Tuesday.  INR down to 2.5, trending down today. Hg stable 11s, plt wnl. No active bleed issues documented.   Goal of Therapy:  INR 2-3 Monitor platelets by anticoagulation protocol: Yes   Plan:  -Will give 3 mg of warfarin tonight -Monitor daily INR, CBC, s/sx bleeding  Erin Hearing PharmD., BCPS Clinical Pharmacist 02/27/2018 11:19 AM

## 2018-02-28 ENCOUNTER — Telehealth: Payer: Self-pay

## 2018-02-28 LAB — BASIC METABOLIC PANEL
Anion gap: 13 (ref 5–15)
BUN: 43 mg/dL — ABNORMAL HIGH (ref 8–23)
CO2: 29 mmol/L (ref 22–32)
Calcium: 8.9 mg/dL (ref 8.9–10.3)
Chloride: 93 mmol/L — ABNORMAL LOW (ref 98–111)
Creatinine, Ser: 2.63 mg/dL — ABNORMAL HIGH (ref 0.61–1.24)
GFR calc Af Amer: 26 mL/min — ABNORMAL LOW (ref >60)
GFR, EST NON AFRICAN AMERICAN: 23 mL/min — AB (ref >60)
Glucose, Bld: 103 mg/dL — ABNORMAL HIGH (ref 70–99)
Potassium: 4.3 mmol/L (ref 3.5–5.1)
Sodium: 135 mmol/L (ref 135–145)

## 2018-02-28 LAB — CBC WITH DIFFERENTIAL/PLATELET
Abs Immature Granulocytes: 0.02 10*3/uL (ref 0.00–0.07)
Basophils Absolute: 0 10*3/uL (ref 0.0–0.1)
Basophils Relative: 0 %
EOS PCT: 1 %
Eosinophils Absolute: 0.1 10*3/uL (ref 0.0–0.5)
HEMATOCRIT: 37.2 % — AB (ref 39.0–52.0)
Hemoglobin: 12.1 g/dL — ABNORMAL LOW (ref 13.0–17.0)
Immature Granulocytes: 0 %
Lymphocytes Relative: 12 %
Lymphs Abs: 0.6 10*3/uL — ABNORMAL LOW (ref 0.7–4.0)
MCH: 29.5 pg (ref 26.0–34.0)
MCHC: 32.5 g/dL (ref 30.0–36.0)
MCV: 90.7 fL (ref 80.0–100.0)
Monocytes Absolute: 0.7 10*3/uL (ref 0.1–1.0)
Monocytes Relative: 13 %
Neutro Abs: 4 10*3/uL (ref 1.7–7.7)
Neutrophils Relative %: 74 %
Platelets: 162 10*3/uL (ref 150–400)
RBC: 4.1 MIL/uL — ABNORMAL LOW (ref 4.22–5.81)
RDW: 15 % (ref 11.5–15.5)
WBC: 5.4 10*3/uL (ref 4.0–10.5)
nRBC: 0 % (ref 0.0–0.2)

## 2018-02-28 LAB — PROTIME-INR
INR: 2.2
Prothrombin Time: 24.2 seconds — ABNORMAL HIGH (ref 11.4–15.2)

## 2018-02-28 LAB — GLUCOSE, CAPILLARY
GLUCOSE-CAPILLARY: 136 mg/dL — AB (ref 70–99)
Glucose-Capillary: 136 mg/dL — ABNORMAL HIGH (ref 70–99)
Glucose-Capillary: 133 mg/dL — ABNORMAL HIGH (ref 70–99)
Glucose-Capillary: 114 mg/dL — ABNORMAL HIGH (ref 70–99)
Glucose-Capillary: 152 mg/dL — ABNORMAL HIGH (ref 70–99)
Glucose-Capillary: 97 mg/dL (ref 70–99)

## 2018-02-28 LAB — COOXEMETRY PANEL
Carboxyhemoglobin: 1.2 % (ref 0.5–1.5)
Methemoglobin: 1.7 % — ABNORMAL HIGH (ref 0.0–1.5)
O2 Saturation: 56 %
Total hemoglobin: 12.4 g/dL (ref 12.0–16.0)

## 2018-02-28 MED ORDER — METOLAZONE 2.5 MG PO TABS: ORAL_TABLET | ORAL | 3 refills | Status: DC

## 2018-02-28 MED ORDER — CARVEDILOL 6.25 MG PO TABS: 6.2500 mg | ORAL_TABLET | Freq: Two times a day (BID) | ORAL | 5 refills | Status: DC

## 2018-02-28 MED ORDER — ISOSORBIDE MONONITRATE ER 60 MG PO TB24: 60.0000 mg | ORAL_TABLET | Freq: Every day | ORAL | 5 refills | Status: AC

## 2018-02-28 MED ORDER — HYDRALAZINE HCL 25 MG PO TABS: 37.5000 mg | ORAL_TABLET | Freq: Three times a day (TID) | ORAL | Status: DC

## 2018-02-28 MED ORDER — HYDRALAZINE HCL 25 MG PO TABS: 37.5000 mg | ORAL_TABLET | Freq: Three times a day (TID) | ORAL | 5 refills | Status: AC

## 2018-02-28 MED ORDER — WARFARIN SODIUM 2.5 MG PO TABS: ORAL_TABLET | ORAL | 1 refills | Status: AC

## 2018-02-28 NOTE — Progress Notes (Signed)
Patient declined oxygen for transport states that he does not use it all the time and doesn't need it to go home. He states that he lives 15 minutes away. Was sitting on edge of bed on RA in no distress during our conversation.

## 2018-02-28 NOTE — Progress Notes (Signed)
Patient has post hospital follow-up appt scheduled for 12/16 at 11AM in the [facility].

## 2018-02-28 NOTE — Consult Note (Signed)
   [facility] CM Inpatient Consult   02/28/2018  [name] 1941-01-08 [id number]  Patient screened for potential [facility] Care Management services due to unplanned readmission risk score of 24% (high) and 2 hospitalizations within the past 6 months.   Went to bedside to speak with [name] to explain [place] when transitions home. Patient states that he doesn't think that he will need the services at this time. States that he has no needs for transportation or medication assistance. Brochure given to patient and explained that he may call the number on the brochure should he change his mind. Patient declines [facility] services.   Netta Cedars, MSN, [facility] Liaison Nurse Mobile Phone [phone]  Toll free office [phone]

## 2018-02-28 NOTE — Progress Notes (Addendum)
Patient ID: [name], male   DOB: [birth year], 78 y.o.   MRN: [id number]     Advanced Heart Failure Rounding Note  PCP-Cardiologist: No primary care provider on file.   Subjective:    He feels better than prior to admission, not coughing.  Switched to po torsemide yesterday, CVP a little higher at 12 but no dyspnea.  Co-ox 56%, now off milrinone.  Creatinine seems to have plateaued 2.87 => 2.72 => 2.6 => 2.66 => 2.81 => 2.8 => 2.6.   Echo: EF 35-40%, mild LV dilation, mild AI, mild MR, normal RV  RHC Procedural Findings: Hemodynamics (mmHg) RA mean 12 RV 58/10 PA 61/29, mean 40 PCWP mean 27 Oxygen saturations: PA 55% AO 95% Cardiac Output (Fick) 4.2  Cardiac Index (Fick) 1.91 PVR 3 WU   Objective:   Weight Range: 97 kg Body mass index is 29.01 kg/m.   Vital Signs:   Temp:  [97.6 F (36.4 C)-98.4 F (36.9 C)] 98 F (36.7 C) (12/11 0448) Pulse Rate:  [69-120] 106 (12/11 0448) Resp:  [18-29] 24 (12/11 0448) BP: (102-129)/(77-92) 124/85 (12/11 0448) SpO2:  [92 %-100 %] 100 % (12/11 0448) Weight:  [97 kg] 97 kg (12/11 0500) Last BM Date: 02/27/18  Weight change: Filed Weights   02/26/18 0414 02/27/18 0445 02/28/18 0500  Weight: 99.2 kg 97.8 kg 97 kg    Intake/Output:   Intake/Output Summary (Last 24 hours) at 02/28/2018 0740 Last data filed at 02/28/2018 0300 Gross per 24 hour  Intake 812.34 ml  Output 2000 ml  Net -1187.66 ml      Physical Exam    General: NAD Neck: JVP 8-9 cm, no thyromegaly or thyroid nodule.  Lungs: Clear to auscultation bilaterally with normal respiratory effort. CV: Nondisplaced PMI.  Heart irregular S1/S2, no S3/S4, no murmur.  1+ ankle edema.  No carotid bruit.  Normal pedal pulses.  Abdomen: Soft, nontender, no hepatosplenomegaly, no distention.  Skin: Intact without lesions or rashes.  Neurologic: Alert and oriented x 3.  Psych: Normal affect. Extremities: No clubbing or cyanosis.  HEENT: Normal.    Telemetry    Atrial fibrillation with occasional PVCs, wide QRS complex, HR 90s-100s (personally reviewed).    Labs    CBC Recent Labs    02/27/18 0402 02/28/18 0526  WBC 5.0 5.4  NEUTROABS 3.4 4.0  HGB 11.3* 12.1*  HCT 34.2* 37.2*  MCV 89.1 90.7  PLT 165 166   Basic Metabolic Panel Recent Labs    02/27/18 0402 02/28/18 0526  NA 136 135  K 2.8* 4.3  CL 93* 93*  CO2 28 29  GLUCOSE 98 103*  BUN 45* 43*  CREATININE 2.80* 2.63*  CALCIUM 8.6* 8.9   Liver Function Tests No results for input(s): AST, ALT, ALKPHOS, BILITOT, PROT, ALBUMIN in the last 72 hours. No results for input(s): LIPASE, AMYLASE in the last 72 hours. Cardiac Enzymes No results for input(s): CKTOTAL, CKMB, CKMBINDEX, TROPONINI in the last 72 hours.  BNP: BNP (last 3 results) Recent Labs    12/29/17 1048 02/22/18 1215  BNP 1,274.1* 2,025.5*    ProBNP (last 3 results) No results for input(s): PROBNP in the last 8760 hours.   D-Dimer No results for input(s): DDIMER in the last 72 hours. Hemoglobin A1C No results for input(s): HGBA1C in the last 72 hours. Fasting Lipid Panel No results for input(s): CHOL, HDL, LDLCALC, TRIG, CHOLHDL, LDLDIRECT in the last 72 hours. Thyroid Function Tests No results for input(s): TSH, T4TOTAL, T3FREE, THYROIDAB  in the last 72 hours.  Invalid input(s): FREET3  Other results:   Imaging    No results found.   Medications:     Scheduled Medications: . allopurinol  200 mg Oral QPM  . atorvastatin  10 mg Oral q1800  . carvedilol  6.25 mg Oral BID WC  . cholecalciferol  1,000 Units Oral Daily  . DULoxetine  30 mg Oral Daily  . gabapentin  600 mg Oral TID  . hydrALAZINE  37.5 mg Oral Q8H  . insulin aspart  0-5 Units Subcutaneous QHS  . insulin aspart  0-9 Units Subcutaneous TID WC  . insulin NPH Human  6 Units Subcutaneous QHS  . insulin NPH Human  8 Units Subcutaneous QAC breakfast  . ipratropium  2 spray Each Nare Daily  . isosorbide mononitrate  30 mg  Oral Daily  . pantoprazole  80 mg Oral Daily  . polyvinyl alcohol  2 drop Both Eyes Daily  . potassium chloride SA  40 mEq Oral Daily  . sodium chloride flush  3 mL Intravenous Q12H  . sodium chloride flush  3 mL Intravenous Q12H  . sodium chloride flush  3 mL Intravenous Q12H  . torsemide  100 mg Oral BID  . Warfarin - Pharmacist Dosing Inpatient   Does not apply q1800    Infusions: . sodium chloride    . sodium chloride Stopped (02/26/18 1900)  . sodium chloride    . sodium chloride      PRN Medications: sodium chloride, sodium chloride, sodium chloride, acetaminophen, methocarbamol, ondansetron (ZOFRAN) IV, sodium chloride flush, sodium chloride flush, sodium chloride flush   Assessment/Plan   1. Acute on chronic systolic CHF: Ischemic cardiomyopathy. 12/2017 Echo, EF 35-40%, stable. Medtronic CRT-D but LV lead turned off due to high threshold.  11/20109 Unable to place epicardial lead surgically. PADP 34 on Cardiomems on 12/5 and has been trending up. Creatinine worsening, up to 2.87 12/5.  Patient was admitted after [place] on 12/6 showed low output with CI 1.9 and elevated filling pressures.  PICC placed and started on milrinone 0.25 with IV Lasix.  Echo this admission with stable EF 35-40%.  CXR showed pulmonary edema.  This morning, co-ox 56% off milrinone and CVP a little higher at 12. I/Os net negative yesterday on torsemide, weight down 2 lbs.  Creatinine lower at 2.6.   - Continue torsemide 100 mg bid (home dose).  - Will stay off milrinone.  - Hold Entresto and digoxin with AKI.  - Unable to take eplerenone or spironolactone due to breast tenderness.  This has mostly resolved off these meds.   - Decreased coreg to 6.25 mg BID with low output.  - Continue hydralazine 37.5 mg tid and Imdur 30 daily.    2. Atrial fibrillation: Permanent.  HR 90s-100s on lower Coreg, will accept for now.  - Continue warfarin (INR down to 2.2).    - Continue Coreg for rate control at lower dose.   3. CAD: s/p CABG.  No recent chest pain.  - No ASA given warfarin use.  - Continue statin.  Good lipids 4/19.   4. AKI on CKD Stage III:  Stopped digoxin and Entresto.  Creatinine down to 2.6.   5. Gout: stable. Continue allopurinol.  6. Ascending aortic aneurysm: 5/19 CT chest with 5.2 cm ascending aorta.  He saw Dr. Servando Snare.  Aneurysm has been relatively stable, would be high risk for operation.  7. Disposition: He is stable off milrinone.  I am going to  let him go home today.  Creatinine down to 2.6 today.  CVP 12 but feels good and weight down, plan to slowly diurese as outpatient. He will need close followup in 1 week.  Meds for home: Coreg 6.25 mg bid, warfarin, hydralazine 37.5 mg tid, Imdur 60 daily, torsemide 100 mg bid, metolazone 2.5 mg once weekly only on Mondays, atorvastatin 10 daily. Stay off Entresto and digoxin for now.   Length of Stay: [place], MD  02/28/2018, 7:40 AM  Advanced Heart Failure Team Pager [phone] (M-F; 7a - 4p)  Please contact [facility] for night-coverage after hours (4p -7a ) and weekends on [email or web address]

## 2018-02-28 NOTE — Progress Notes (Signed)
[place] for warfarin Indication: atrial fibrillation  Allergies  Allergen Reactions  . Avelox [Moxifloxacin Hcl In Nacl] Swelling, Rash and Other (See Comments)    Patient became hypotensive after infusion started Because of a history of documented adverse serious drug reaction;Medi Alert bracelet  is recommended  . Other Rash and Swelling    Patient became hypotensive after infusion started Because of a history of documented adverse serious drug reaction;Medi Alert braceletis recommended  . Penicillins Anaphylaxis, Swelling and Other (See Comments)    Because of a history of documented adverse serious drug reaction;Medi Alert bracelet  is recommended Has patient had a PCN reaction causing immediate rash, facial/tongue/throat swelling, SOB or lightheadedness with hypotension: Yes Has patient had a PCN reaction causing severe rash involving mucus membranes or skin necrosis: No Has patient had a PCN reaction that required hospitalization: Unknown Has patient had a PCN reaction occurring within the last 10 years: No    Patient Measurements: Height: 6' (182.9 cm) Weight: 213 lb 14.4 oz (97 kg) IBW/kg (Calculated) : 77.6  Vital Signs: Temp: 100.1 F (37.8 C) (12/11 0751) Temp Source: Oral (12/11 0751) BP: 127/95 (12/11 0751) Pulse Rate: 107 (12/11 0751)  Labs: Recent Labs    02/26/18 0456 02/27/18 0402 02/28/18 0526  HGB 11.7* 11.3* 12.1*  HCT 37.2* 34.2* 37.2*  PLT 180 165 162  LABPROT 29.6* 26.8* 24.2*  INR 2.87 2.52 2.20  CREATININE 2.81* 2.80* 2.63*    Estimated Creatinine Clearance: 28.9 mL/min (A) (by C-G formula based on SCr of 2.63 mg/dL (H)).   Medical History: Past Medical History:  Diagnosis Date  . AAA (abdominal aortic aneurysm) ([place])    Surgical repair 11/2002.  . Adenomatous colon polyp   . AICD (automatic cardioverter/defibrillator) present   . Alcohol ingestion of more than four drinks per week    Excess  beer  not a dependency problem  . Aortic valve sclerosis   . Arthritis   . Atrial fibrillation ([place])    Previous long-term amiodarone therapy with multiple cardioversions / amiodarone stopped September, 2009  . Atrial flutter [facility])    Started November, 2010, Left-sided and cannot ablate  . Bony abnormality    Patient's manubrium is slightly displaced to the right  . CAD (coronary artery disease)    a. s/p CABG 2004;  b. 02/2016 Cath: LM nl, LAD 70p, 145m, D1 50, D2 50, RI 50ost, LCX 54m, OM2 100, OM3 100, RCA 70p, 100d, VG->OM3 ok, LIMA->LAD 60ost, VG->RPDA  ok.  . 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 01/2013 MDT DTBA 1D1 [place] CRT-D, ser # [id number].  . Carotid artery disease ([place])    Doppler, December, 2013, 0-39% bilateral  . Chronic systolic CHF (congestive heart failure) ([place])    a. 02/2016 Echo: EF 35-40%, diff HK, triv AI, mildly dil Ao root 57mm, mild MR, sev dil LA, triv TR.  Marland Kitchen Chronotropic incompetence    IV pacing rate adjusted  . CKD (chronic kidney disease), stage III ([place])   . COPD (chronic obstructive pulmonary disease) ([place])   . Dilated aortic root ([place])   . Discolored skin   . Diverticulosis   . Drug therapy    Redness and swelling with Avelox infusion May 24, 2011  . Dyspnea   . Eye abnormality    Ophthalmologist questions a clot in one of his eyes, May, 2012  . Gout   . Hyperlipidemia   . Hypertension   .  Internal hemorrhoids   . Ischemic cardiomyopathy   . Left atrial thrombus    Remote past... cardioversions done since that time  . Mitral regurgitation    Mild echo  . Myocardial infarction ([place])   . Nasal drainage    Chronic  . Overweight(278.02)   . Pericardial effusion   . Pleural effusion    Large loculated effusion on the left side November, 2011. This was tapped. It was exudative. Cytology revealed no cancer no proof of mesothelioma area pulmonary team felt that no further workup was needed  . Pleural  thickening   . Pneumonia   . Polycythemia vera ([place]) 07/28/2014  . Presence of permanent cardiac pacemaker   . SOB (shortness of breath)    Large left effusion/ thoracentesis/hospitalization/November, 2011... exudated.. cytology negative.. Dr.Wert.. no proof of mesothelioma  . Spinal stenosis    Surgery Dr.Elsner  . Thrombophlebitis of superficial veins of upper extremities    Possible venous stenosis from defibrillator  . Type II diabetes mellitus ([place]) 2008  . Venous insufficiency    Toe discoloration chronic  . Ventral hernia    April, 2014, result of his abdominal surgery  . Warfarin anticoagulation   . Wide-complex tachycardia ([place])     Assessment: 24 yoM admitted for RHC found to have volume overload and low output. Pt on warfarin PTA for hx of PAF with last dose 12/4 (pt advised to hold on 12/5 after clinic visit).   INR on admit 3.95, home regimen is 2.5mg  daily except 5mg  Tuesday.  INR down to 2.2, trending down today. Hg stable 12s, plt wnl. No active bleed issues documented.   Goal of Therapy:  INR 2-3 Monitor platelets by anticoagulation protocol: Yes   Plan:  -Will give 5 mg of warfarin tonight -Monitor daily INR, CBC, s/sx bleeding  Discharge recommendations:  At this point his INR continues to trend down on low dose of warfarin so will give 5mg  today and would restart his home dose of 2.5mg  daily except 5mg  Tuesday starting next week. Would recommend a repeat INR check early next week.    Erin Hearing PharmD., BCPS Clinical Pharmacist 02/28/2018 9:08 AM

## 2018-02-28 NOTE — Telephone Encounter (Signed)
PT is on TCM list after admission after acute on chronic HF. Pt was dc'ed on 02/28/2018. Pt to follow up with [facility].

## 2018-02-28 NOTE — Care Management Important Message (Signed)
Important Message  Patient Details  Name: [name] MRN: [id number] Date of Birth: Nov 29, 1940   Medicare Important Message Given:  Yes    Perrion Diesel P Aaminah Forrester 02/28/2018, 12:25 PM

## 2018-03-01 ENCOUNTER — Telehealth (HOSPITAL_COMMUNITY): Payer: Self-pay

## 2018-03-01 ENCOUNTER — Ambulatory Visit: Payer: Medicare Other | Admitting: Cardiothoracic Surgery

## 2018-03-01 ENCOUNTER — Other Ambulatory Visit: Payer: Medicare Other

## 2018-03-01 MED ORDER — CARVEDILOL 12.5 MG PO TABS: 12.5000 mg | ORAL_TABLET | Freq: Two times a day (BID) | ORAL | 3 refills | Status: AC

## 2018-03-01 NOTE — Telephone Encounter (Signed)
Pt's wife called to report that pt has been sick since released from [facility] on 02/28/18. Pt is experiencing weakness to point of cannot stand, edema, and vomiting. Pt's bp 149/87, HR 99, weight 211.  Per Dr. Aundra Dubin, pt should take metolazone 2.5 mg today and tomorrow, increase carvedilol to 12.5 mg twice daily, and make a f/u with PA/NP on Monday 03/07/2018.

## 2018-03-02 ENCOUNTER — Encounter: Payer: Medicare Other | Admitting: Internal Medicine

## 2018-03-04 NOTE — Progress Notes (Signed)
Advanced Heart Failure Clinic Note  PCP: Dr. Quay Burow Cardiologist: Dr Aundra Dubin   HPI: Mr Tullis is a 77 y.o. with history of CAD s/p CABG, permanent atrial fib, S/P AAA repair, ischemic cardiomyopathy/chronic systolic heart failure,  Medtronic CRT-D system.   Admitted 4/14 through 07/06/16 with volume overload. Diuresed with IV lasix and transitioned to torsemide 60 mg daily. HF meds adjusted and he was started on Entresto. Discharge weight was 220 pounds.  He now has a Cardiomems device. Torsemide has been increased to 80 mg bid based on Cardiomems evaluation.   Echo in 4/19 showed EF improved to 40-45%.  He stopped eplerenone due to breast pain which has resolved (same thing with spironolactone).   RHC was done in 7/19, PA pressure from [place] matched Cardiomems (there had been some question of Cardiomems accuracy).   Admitted 01/2018 for left thoracotomy and epicardial lead placement by [facility]. Unfortunately that was not successful. He was discharged on 02/02/2018. Creatinine at the time of discharge was 1.5.   He was seen in HF clinic 12/5 and was volume overloaded with AKI. He was set up for [place] on 12/6 and was admitted with low output and volume overload. Per Dr Aundra Dubin, cardiomems and [facility] numbers correlated. PICC was placed and he was started on milrinone and IV lasix. Transitioned to torsemide 100 mg BID and added metolazone 2.5 mg weekly. Milrinone slowly weaned off with adequate coox. Renal function monitored closely. Creatinine peaked at 2.87 and came back to 2.63 by day of discharge. Entresto and digoxin stopped with AKI. Started on hydralazine and imdur. Coreg dose was decreased. DC weight: 214 lbs  Today he returns for post hospital follow up. Pt's wife called HF clinic on 12/12 with complaints of edema, vomiting, and weakness. Weight was down to 211, BP 149/87, HR 99. Dr Aundra Dubin instructed him to take metolazone 2 days in a row and coreg was increased. Overall doing poorly. No further  vomiting. He has a poor appetite and abdominal distension. He does not urinate much with metolazone. Urine is dark per wife. He is SOB with minimal activity, but has not been active. Very fatigued. Worsening BLE edema. Denies orthopnea or PND.  He is wearing O2 PRN. He has a cough with clear sputum. No fever or chills. No bleeding. He has CP on and off on left side, but has been present since thoracotomy in November. Describes as "shooting". No change in frequency or severity. Weights down 214 > 211 lbs on home scale. No missed medications. Wife requests that we draw INR today so he does not have to get stuck again tomorrow.   Cardiomems Reading on 12/16: PAD 31 (goal is 23)  ECG (10/19): Atrial fibrillation, LBBB-like IVCD 164 msec   Labs (5/18): K 4.7, creatinine 1.7, hgb 15.8, BNP 1001, LDL 71 Labs (8/18): K 4.3, creatinine 1.56, hgb 14.3 Labs (9/18): K 4, creatinine 1.79, digoxin 0.8 Labs (10/18): digoxin 0.3 Labs (11/18): K 4, creatinine 1.71 Labs (4/19): K 3.6, creatinine 1.45, LDL 52, HDL 42 Labs (5/19): K 4.1, creatinine 1.71 Labs (8/19): K 4.2 => 3.9, creatinine 1.5 => 1.48, hgb 14.8, digoxin 0.5 Labs (10/19): hgb 14.1, digoxin 0.5, BNP 1274, K 3.4, creatinine 1.39 Labs (01/31/2018): K 3.7 Creatinine 1.5   PMH: 1. CAD: s/p CABG.  - LHC (12/17): totally occluded distal RCA, totally occluded mid LAD, totally occluded OM2 and OM3.  LIMA-LAD patent with 60% ostial stenosis.  SVG-OM3 and SVG-PDA were patent. Medical management.  2. Chronic systolic CHF:  Ischemic cardiomyopathy.  He has a Medtronic CRT-D device but LV lead not turned on due to high threshold.  - Echo (12/17) with EF 35-40%, diffuse hypokinesis, normal RV size and systolic function, mild MR.  - CardioMems placed.  - Painful gynecomastia with spironolactone and eplerenone.  - Echo (4/19) with EF 40-45%, mild MR.  - RHC (7/19) with mean RA 9, PA 43/21 mean 30, mean PCWP 19, CI 2.15, PVR 2.3 WU (PA pressure matched  Cardiomems).  - Echo (10/19): EF 35-40%, mild LVH, septal-lateral dyssynchrony, grade II diastolic dysfunction, moderate MR, mildly dilated RV with mildly decreased systolic function 3. AAA: s/p repair.  4. Atrial fibrillation: Permanent.  5. Gout 6. PAD: Saw Dr Scot Dock with [facility] in 4/18.  Noninvasive study concerning for infra-inguinal arterial occlusive disease bilaterally but appeared to have adequate circulation to heal toe ulcer.  7. CKD stage 3.  8. Ascending aortic aneurysm: CT chest 3/18 ascending aorta 4.9 cm, arch 4.4 cm.  - CTA chest (5/19): 5.2 cm ascending aorta.  9. Spinal stenosis 10. Polycythemia vera 11. Hyperlipidemia 12. Type II diabetes    Review of systems complete and found to be negative unless listed in HPI.   Social History   Socioeconomic History  . Marital status: Married    Spouse name: Not on file  . Number of children: Not on file  . Years of education: Not on file  . Highest education level: Not on file  Occupational History  . Occupation: Retired- Nurse, mental health  Social Needs  . Financial resource strain: Not on file  . Food insecurity:    Worry: Not on file    Inability: Not on file  . Transportation needs:    Medical: Not on file    Non-medical: Not on file  Tobacco Use  . Smoking status: Former Smoker    Packs/day: 3.00    Years: 39.00    Pack years: 117.00    Types: Cigarettes    Start date: 05/24/1956    Last attempt to quit: 03/22/1995    Years since quitting: 22.9  . Smokeless tobacco: Never Used  Substance and Sexual Activity  . Alcohol use: Yes    Alcohol/week: 2.0 standard drinks    Types: 2 Cans of beer per week  . Drug use: No  . Sexual activity: Not Currently  Lifestyle  . Physical activity:    Days per week: Not on file    Minutes per session: Not on file  . Stress: Not on file  Relationships  . Social connections:    Talks on phone: Not on file    Gets together: Not on file    Attends religious service: Not  on file    Active member of club or organization: Not on file    Attends meetings of clubs or organizations: Not on file    Relationship status: Not on file  . Intimate partner violence:    Fear of current or ex partner: Not on file    Emotionally abused: Not on file    Physically abused: Not on file    Forced sexual activity: Not on file  Other Topics Concern  . Not on file  Social History Narrative  . Not on file    Family History  Problem Relation Age of Onset  . Hypertension Mother   . Stroke Mother   . Diabetes Father   . Coronary artery disease Father   . Other Father  DVT  . Diabetes Brother   . Colon cancer Neg Hx   . Heart attack Neg Hx      Current Outpatient Medications  Medication Sig Dispense Refill  . ACCU-CHEK AVIVA PLUS test strip USE 1 STRIP TO CHECK GLUCOSE THREE TIMES DAILY AS DIRECTED 300 each 3  . ACCU-CHEK FASTCLIX LANCETS MISC USE 1 LANCET TO CHECK GLUCOSE THREE TIMES DAILY 102 each 5  . acetaminophen (TYLENOL) 325 MG tablet Take 650-975 mg by mouth 3 (three) times daily as needed for moderate pain.     Marland Kitchen allopurinol (ZYLOPRIM) 100 MG tablet TAKE TWO TABLETS BY MOUTH ONCE DAILY (Patient taking differently: Take 200 mg by mouth every evening. ) 180 tablet 3  . atorvastatin (LIPITOR) 10 MG tablet Take 1 tablet (10 mg total) by mouth daily. 90 tablet 2  . carboxymethylcellulose (REFRESH PLUS) 0.5 % SOLN Place 2 drops into both eyes daily.     . carvedilol (COREG) 12.5 MG tablet Take 1 tablet (12.5 mg total) by mouth 2 (two) times daily with a meal. 60 tablet 3  . cholecalciferol (VITAMIN D) 1000 units tablet Take 1,000 Units by mouth daily.    . colchicine 0.6 MG tablet Take 0.6 mg by mouth daily as needed (gout flares).     . DULoxetine (CYMBALTA) 30 MG capsule TAKE 1 CAPSULE BY MOUTH ONCE DAILY (Patient taking differently: Take 30 mg by mouth daily. ) 90 capsule 1  . gabapentin (NEURONTIN) 300 MG capsule Take 2 capsules (600 mg total) by mouth 3  (three) times daily. TAKE ONE CAPSULE BY MOUTH 5 TIMES DAILY AS NEEDED FOR LEG PAIN (Patient taking differently: Take 600-900 mg by mouth 3 (three) times daily. ) 540 capsule 3  . hydrALAZINE (APRESOLINE) 25 MG tablet Take 1.5 tablets (37.5 mg total) by mouth every 8 (eight) hours. 135 tablet 5  . insulin NPH Human (NOVOLIN N) 100 UNIT/ML injection Inject 6-8 Units into the skin See admin instructions. Inject 8 units subcutaneously in the morning and inject 6 units subcutaneously in the evening.    . insulin regular (NOVOLIN R) 100 units/mL injection Inject 6 Units into the skin daily before supper.     Marland Kitchen ipratropium (ATROVENT) 0.06 % nasal spray Place 2 sprays into both nostrils daily.     . isosorbide mononitrate (IMDUR) 60 MG 24 hr tablet Take 1 tablet (60 mg total) by mouth daily. 30 tablet 5  . methocarbamol (ROBAXIN) 750 MG tablet Take 1 tablet (750 mg total) by mouth 2 (two) times daily as needed for muscle spasms. 90 tablet 1  . metolazone (ZAROXOLYN) 2.5 MG tablet Take once weekly on Mondays. 10 tablet 3  . metroNIDAZOLE (METROGEL) 0.75 % gel Apply 1 application topically daily as needed (rosacea).   2  . nitroGLYCERIN (NITROSTAT) 0.4 MG SL tablet Place 1 tablet (0.4 mg total) under the tongue every 5 (five) minutes as needed for chest pain (MAX 3 TABLETS). 25 tablet 3  . omeprazole (PRILOSEC) 20 MG capsule Take 2 capsules (40 mg total) by mouth 2 (two) times daily. (Patient taking differently: Take 40 mg by mouth daily. ) 120 capsule 11  . potassium chloride SA (K-DUR,KLOR-CON) 20 MEQ tablet Take 2 tablets (40 mEq total) by mouth daily. 90 tablet 3  . torsemide (DEMADEX) 100 MG tablet Take 1 tablet (100 mg total) by mouth 2 (two) times daily. 60 tablet 3  . warfarin (COUMADIN) 2.5 MG tablet Take 5 mg on Wednesday, 12/11. TAKE 2 TABLETS (5 MG)  BY MOUTH ON TUESDAYS, THEN TAKE 1 TABLET (2.5 MG) BY MOUTH ON ALL OTHER DAYS. 120 tablet 1   No current facility-administered medications for this  encounter.     Vitals:   02/24/2018 1130  BP: 130/80  Pulse: (!) 59  SpO2: 95%  Weight: 96.1 kg (211 lb 12.8 oz)   Wt Readings from Last 3 Encounters:  03/17/2018 96.1 kg (211 lb 12.8 oz)  02/28/18 97 kg (213 lb 14.4 oz)  02/22/18 99.3 kg (219 lb)   PHYSICAL EXAM: General:  No resp difficulty. HEENT: Normal Neck: Supple. JVP ~10. Carotids 2+ bilat; no bruits. No thyromegaly or nodule noted. Cor: PMI nondisplaced. RRR, No M/G/R noted Lungs: CTAB, normal effort. Abdomen: Soft, non-tender, +distended, no HSM. No bruits or masses. +BS  Extremities: No cyanosis, clubbing, or rash. R and LLE 2+ edema 3/4 to knees.   Neuro: Alert & orientedx3, cranial nerves grossly intact. moves all 4 extremities w/o difficulty. Affect pleasant    ASSESSMENT & PLAN: 1. Chronic Systolic Heart Failure: Ischemic cardiomyopathy. 02/2018 Echo, EF 35-40%, stable. Medtronic CRT-D but LV lead turned off due to high threshold.  [id number] Unable to place epicardial lead. Recent admission required milrinone. - Volume elevated on exam and cardiomems - Continue torsemide 100 mg bid. Increase metolazone to 2.5 mg every other day. Close follow up in 1 week. Offered admission, but he declined. He will need a BMET at follow up.    - No digoxin/entresto/spiro with CKD - Continue hydralazine 37.5 mg TID + imdur 60 mg daily - Continue Coreg 12.5 mg bid.  - Unable to take eplerenone or spironolactone due to breast tenderness.  This has mostly resolved off these meds.   2. Atrial fibrillation: Permanent.  Rate controlled.  Continue warfarin. Denies bleeding.  3. CAD: s/p CABG.  He has atypical chest pain.  - No ASA given warfarin use.  - Continue statin.  Good lipids 4/19.   4. CKD Stage III:  Creatinine leveled off ~2.6 while inpatient. Check BMET today.   5. Gout: Stable. Continue allopurinol.  6. Ascending aortic aneurysm: 5/19 CT chest with 5.2 cm ascending aorta. He saw Dr. Servando Snare. Aneurysm has been relatively  stable, would be high risk for operation.  - Keep SBP < 130  BMET, INR (per wife's request - she will let [facility] office know results today), CBC Increase metolazone to 2.5 mg every other day. Discussed s/s that warrant ER visit. Will need BMET at follow up in 1 week. Discussed the above with Dr Aundra Dubin.  Georgiana Shore NP-C 03/06/2018  Greater than 50% of the 25 minute visit was spent in counseling/coordination of care regarding disease state education, salt/fluid restriction, sliding scale diuretics, and medication compliance.

## 2018-03-05 ENCOUNTER — Inpatient Hospital Stay (HOSPITAL_COMMUNITY): Payer: Medicare Other

## 2018-03-05 ENCOUNTER — Ambulatory Visit (HOSPITAL_COMMUNITY)
Admission: RE | Admit: 2018-03-05 | Discharge: 2018-03-05 | Disposition: A | Discharge status: Home / Self Care | Payer: Medicare Other | Source: Ambulatory Visit | Attending: Internal Medicine | Admitting: Internal Medicine

## 2018-03-05 ENCOUNTER — Other Ambulatory Visit: Payer: Self-pay

## 2018-03-05 ENCOUNTER — Ambulatory Visit
Admission: RE | Admit: 2018-03-05 | Discharge: 2018-03-05 | Disposition: A | Discharge status: Home / Self Care | Payer: Self-pay | Source: Ambulatory Visit | Attending: Cardiology | Admitting: Cardiology

## 2018-03-05 ENCOUNTER — Inpatient Hospital Stay (HOSPITAL_COMMUNITY)
Admission: AD | Admit: 2018-03-05 | Discharge: 2018-03-21 | DRG: 291 | Disposition: E | Payer: Medicare Other | Source: Ambulatory Visit | Attending: Cardiology | Admitting: Cardiology

## 2018-03-05 ENCOUNTER — Encounter (HOSPITAL_COMMUNITY): Payer: Self-pay

## 2018-03-05 ENCOUNTER — Encounter (HOSPITAL_COMMUNITY): Payer: Self-pay | Admitting: General Practice

## 2018-03-05 VITALS — BP 130/80 | HR 59 | Wt 211.8 lb

## 2018-03-05 DIAGNOSIS — I4821 Permanent atrial fibrillation: Secondary | ICD-10-CM | POA: Diagnosis not present

## 2018-03-05 DIAGNOSIS — E876 Hypokalemia: Secondary | ICD-10-CM | POA: Diagnosis present

## 2018-03-05 DIAGNOSIS — I5043 Acute on chronic combined systolic (congestive) and diastolic (congestive) heart failure: Secondary | ICD-10-CM | POA: Diagnosis not present

## 2018-03-05 DIAGNOSIS — Z66 Do not resuscitate: Secondary | ICD-10-CM | POA: Diagnosis not present

## 2018-03-05 DIAGNOSIS — R570 Cardiogenic shock: Secondary | ICD-10-CM

## 2018-03-05 DIAGNOSIS — N19 Unspecified kidney failure: Secondary | ICD-10-CM | POA: Diagnosis not present

## 2018-03-05 DIAGNOSIS — B9689 Other specified bacterial agents as the cause of diseases classified elsewhere: Secondary | ICD-10-CM | POA: Diagnosis not present

## 2018-03-05 DIAGNOSIS — Z6830 Body mass index (BMI) 30.0-30.9, adult: Secondary | ICD-10-CM

## 2018-03-05 DIAGNOSIS — I5023 Acute on chronic systolic (congestive) heart failure: Secondary | ICD-10-CM | POA: Diagnosis present

## 2018-03-05 DIAGNOSIS — D631 Anemia in chronic kidney disease: Secondary | ICD-10-CM | POA: Diagnosis present

## 2018-03-05 DIAGNOSIS — N390 Urinary tract infection, site not specified: Secondary | ICD-10-CM | POA: Diagnosis not present

## 2018-03-05 DIAGNOSIS — D45 Polycythemia vera: Secondary | ICD-10-CM | POA: Diagnosis present

## 2018-03-05 DIAGNOSIS — N179 Acute kidney failure, unspecified: Secondary | ICD-10-CM

## 2018-03-05 DIAGNOSIS — Z951 Presence of aortocoronary bypass graft: Secondary | ICD-10-CM

## 2018-03-05 DIAGNOSIS — M4800 Spinal stenosis, site unspecified: Secondary | ICD-10-CM | POA: Diagnosis present

## 2018-03-05 DIAGNOSIS — N183 Chronic kidney disease, stage 3 unspecified: Secondary | ICD-10-CM

## 2018-03-05 DIAGNOSIS — I255 Ischemic cardiomyopathy: Secondary | ICD-10-CM

## 2018-03-05 DIAGNOSIS — R0789 Other chest pain: Secondary | ICD-10-CM | POA: Diagnosis present

## 2018-03-05 DIAGNOSIS — H9193 Unspecified hearing loss, bilateral: Secondary | ICD-10-CM | POA: Diagnosis present

## 2018-03-05 DIAGNOSIS — F05 Delirium due to known physiological condition: Secondary | ICD-10-CM | POA: Diagnosis not present

## 2018-03-05 DIAGNOSIS — E1122 Type 2 diabetes mellitus with diabetic chronic kidney disease: Secondary | ICD-10-CM | POA: Diagnosis not present

## 2018-03-05 DIAGNOSIS — Z7901 Long term (current) use of anticoagulants: Secondary | ICD-10-CM

## 2018-03-05 DIAGNOSIS — I712 Thoracic aortic aneurysm, without rupture: Secondary | ICD-10-CM | POA: Diagnosis present

## 2018-03-05 DIAGNOSIS — J811 Chronic pulmonary edema: Secondary | ICD-10-CM | POA: Diagnosis not present

## 2018-03-05 DIAGNOSIS — N184 Chronic kidney disease, stage 4 (severe): Secondary | ICD-10-CM | POA: Diagnosis present

## 2018-03-05 DIAGNOSIS — N62 Hypertrophy of breast: Secondary | ICD-10-CM | POA: Diagnosis present

## 2018-03-05 DIAGNOSIS — Z87891 Personal history of nicotine dependence: Secondary | ICD-10-CM

## 2018-03-05 DIAGNOSIS — I472 Ventricular tachycardia: Secondary | ICD-10-CM | POA: Diagnosis not present

## 2018-03-05 DIAGNOSIS — N320 Bladder-neck obstruction: Secondary | ICD-10-CM | POA: Diagnosis present

## 2018-03-05 DIAGNOSIS — E785 Hyperlipidemia, unspecified: Secondary | ICD-10-CM | POA: Diagnosis not present

## 2018-03-05 DIAGNOSIS — F419 Anxiety disorder, unspecified: Secondary | ICD-10-CM | POA: Diagnosis present

## 2018-03-05 DIAGNOSIS — Z794 Long term (current) use of insulin: Secondary | ICD-10-CM

## 2018-03-05 DIAGNOSIS — I2582 Chronic total occlusion of coronary artery: Secondary | ICD-10-CM | POA: Diagnosis present

## 2018-03-05 DIAGNOSIS — M109 Gout, unspecified: Secondary | ICD-10-CM | POA: Diagnosis present

## 2018-03-05 DIAGNOSIS — R0603 Acute respiratory distress: Secondary | ICD-10-CM | POA: Diagnosis present

## 2018-03-05 DIAGNOSIS — I130 Hypertensive heart and chronic kidney disease with heart failure and stage 1 through stage 4 chronic kidney disease, or unspecified chronic kidney disease: Secondary | ICD-10-CM | POA: Diagnosis not present

## 2018-03-05 DIAGNOSIS — R630 Anorexia: Secondary | ICD-10-CM | POA: Diagnosis present

## 2018-03-05 DIAGNOSIS — I10 Essential (primary) hypertension: Secondary | ICD-10-CM

## 2018-03-05 DIAGNOSIS — Z8679 Personal history of other diseases of the circulatory system: Secondary | ICD-10-CM

## 2018-03-05 DIAGNOSIS — E1151 Type 2 diabetes mellitus with diabetic peripheral angiopathy without gangrene: Secondary | ICD-10-CM | POA: Diagnosis present

## 2018-03-05 DIAGNOSIS — Z452 Encounter for adjustment and management of vascular access device: Secondary | ICD-10-CM

## 2018-03-05 DIAGNOSIS — E8770 Fluid overload, unspecified: Secondary | ICD-10-CM | POA: Diagnosis not present

## 2018-03-05 DIAGNOSIS — Z95810 Presence of automatic (implantable) cardiac defibrillator: Secondary | ICD-10-CM

## 2018-03-05 DIAGNOSIS — Z833 Family history of diabetes mellitus: Secondary | ICD-10-CM

## 2018-03-05 DIAGNOSIS — E871 Hypo-osmolality and hyponatremia: Secondary | ICD-10-CM | POA: Diagnosis not present

## 2018-03-05 DIAGNOSIS — I5021 Acute systolic (congestive) heart failure: Secondary | ICD-10-CM | POA: Diagnosis not present

## 2018-03-05 DIAGNOSIS — Z79899 Other long term (current) drug therapy: Secondary | ICD-10-CM

## 2018-03-05 DIAGNOSIS — I4891 Unspecified atrial fibrillation: Secondary | ICD-10-CM | POA: Diagnosis present

## 2018-03-05 DIAGNOSIS — I5022 Chronic systolic (congestive) heart failure: Secondary | ICD-10-CM

## 2018-03-05 DIAGNOSIS — I2510 Atherosclerotic heart disease of native coronary artery without angina pectoris: Secondary | ICD-10-CM | POA: Diagnosis not present

## 2018-03-05 DIAGNOSIS — I5084 End stage heart failure: Secondary | ICD-10-CM | POA: Diagnosis present

## 2018-03-05 DIAGNOSIS — Z8249 Family history of ischemic heart disease and other diseases of the circulatory system: Secondary | ICD-10-CM

## 2018-03-05 DIAGNOSIS — N139 Obstructive and reflux uropathy, unspecified: Secondary | ICD-10-CM | POA: Diagnosis present

## 2018-03-05 DIAGNOSIS — R0602 Shortness of breath: Secondary | ICD-10-CM

## 2018-03-05 DIAGNOSIS — I509 Heart failure, unspecified: Secondary | ICD-10-CM | POA: Diagnosis not present

## 2018-03-05 DIAGNOSIS — I129 Hypertensive chronic kidney disease with stage 1 through stage 4 chronic kidney disease, or unspecified chronic kidney disease: Secondary | ICD-10-CM | POA: Diagnosis not present

## 2018-03-05 HISTORY — DX: Acute embolism and thrombosis of unspecified femoral vein: I82.419

## 2018-03-05 HISTORY — DX: Other chronic pain: G89.29

## 2018-03-05 HISTORY — DX: Gastro-esophageal reflux disease without esophagitis: K21.9

## 2018-03-05 HISTORY — DX: Gout, unspecified: M10.9

## 2018-03-05 HISTORY — DX: Dorsalgia, unspecified: M54.9

## 2018-03-05 HISTORY — DX: Dependence on supplemental oxygen: Z99.81

## 2018-03-05 LAB — BASIC METABOLIC PANEL
Anion gap: 20 — ABNORMAL HIGH (ref 5–15)
BUN: 93 mg/dL — AB (ref 8–23)
CO2: 28 mmol/L (ref 22–32)
CREATININE: 3.86 mg/dL — AB (ref 0.61–1.24)
Calcium: 8.8 mg/dL — ABNORMAL LOW (ref 8.9–10.3)
Chloride: 85 mmol/L — ABNORMAL LOW (ref 98–111)
GFR calc Af Amer: 16 mL/min — ABNORMAL LOW (ref >60)
GFR calc non Af Amer: 14 mL/min — ABNORMAL LOW (ref >60)
GLUCOSE: 122 mg/dL — AB (ref 70–99)
Potassium: 2.7 mmol/L — CL (ref 3.5–5.1)
Sodium: 133 mmol/L — ABNORMAL LOW (ref 135–145)

## 2018-03-05 LAB — GLUCOSE, CAPILLARY
GLUCOSE-CAPILLARY: 180 mg/dL — AB (ref 70–99)
Glucose-Capillary: 171 mg/dL — ABNORMAL HIGH (ref 70–99)

## 2018-03-05 LAB — CBC
HCT: 39.8 % (ref 39.0–52.0)
Hemoglobin: 13.1 g/dL (ref 13.0–17.0)
MCH: 29.5 pg (ref 26.0–34.0)
MCHC: 32.9 g/dL (ref 30.0–36.0)
MCV: 89.6 fL (ref 80.0–100.0)
Platelets: 217 10*3/uL (ref 150–400)
RBC: 4.44 MIL/uL (ref 4.22–5.81)
RDW: 15.3 % (ref 11.5–15.5)
WBC: 6.6 10*3/uL (ref 4.0–10.5)
nRBC: 0 % (ref 0.0–0.2)

## 2018-03-05 LAB — PROTIME-INR
INR: 4.77
Prothrombin Time: 44 seconds — ABNORMAL HIGH (ref 11.4–15.2)

## 2018-03-05 MED ORDER — POLYVINYL ALCOHOL 1.4 % OP SOLN
1.0000 [drp] | OPHTHALMIC | Status: DC | PRN
  Administered 2018-03-07 – 2018-03-08 (×2): 1 [drp] via OPHTHALMIC
  Filled 2018-03-05: qty 15

## 2018-03-05 MED ORDER — ONDANSETRON HCL 4 MG/2ML IJ SOLN: 4.0000 mg | Freq: Four times a day (QID) | INTRAMUSCULAR | Status: DC | PRN

## 2018-03-05 MED ORDER — SODIUM CHLORIDE 0.9% FLUSH
3.0000 mL | Freq: Two times a day (BID) | INTRAVENOUS | Status: DC
  Administered 2018-03-05 – 2018-03-13 (×5): 3 mL via INTRAVENOUS

## 2018-03-05 MED ORDER — INSULIN ASPART 100 UNIT/ML ~~LOC~~ SOLN: 0.0000 [IU] | Freq: Every day | SUBCUTANEOUS | Status: DC

## 2018-03-05 MED ORDER — ATORVASTATIN CALCIUM 10 MG PO TABS
10.0000 mg | ORAL_TABLET | Freq: Every day | ORAL | Status: DC
  Administered 2018-03-05 – 2018-03-10 (×6): 10 mg via ORAL
  Filled 2018-03-05 (×7): qty 1

## 2018-03-05 MED ORDER — PANTOPRAZOLE SODIUM 40 MG PO TBEC
40.0000 mg | DELAYED_RELEASE_TABLET | Freq: Every day | ORAL | Status: DC
  Administered 2018-03-06 – 2018-03-14 (×9): 40 mg via ORAL
  Filled 2018-03-05 (×10): qty 1

## 2018-03-05 MED ORDER — SODIUM CHLORIDE 0.9% FLUSH: 10.0000 mL | INTRAVENOUS | Status: DC | PRN

## 2018-03-05 MED ORDER — INSULIN ASPART 100 UNIT/ML ~~LOC~~ SOLN
0.0000 [IU] | Freq: Three times a day (TID) | SUBCUTANEOUS | Status: DC
  Administered 2018-03-06: 3 [IU] via SUBCUTANEOUS
  Administered 2018-03-06 – 2018-03-07 (×2): 2 [IU] via SUBCUTANEOUS
  Administered 2018-03-07: 3 [IU] via SUBCUTANEOUS
  Administered 2018-03-07 – 2018-03-08 (×2): 2 [IU] via SUBCUTANEOUS
  Administered 2018-03-08: 3 [IU] via SUBCUTANEOUS
  Administered 2018-03-08: 2 [IU] via SUBCUTANEOUS
  Administered 2018-03-09: 3 [IU] via SUBCUTANEOUS
  Administered 2018-03-09 (×2): 2 [IU] via SUBCUTANEOUS
  Administered 2018-03-10: 5 [IU] via SUBCUTANEOUS
  Administered 2018-03-10 – 2018-03-14 (×12): 3 [IU] via SUBCUTANEOUS

## 2018-03-05 MED ORDER — CARVEDILOL 3.125 MG PO TABS
3.1250 mg | ORAL_TABLET | Freq: Two times a day (BID) | ORAL | Status: DC
  Administered 2018-03-05 – 2018-03-07 (×5): 3.125 mg via ORAL
  Filled 2018-03-05 (×5): qty 1

## 2018-03-05 MED ORDER — MILRINONE LACTATE IN DEXTROSE 20-5 MG/100ML-% IV SOLN
0.2500 ug/kg/min | INTRAVENOUS | Status: DC
  Administered 2018-03-05 – 2018-03-09 (×8): 0.25 ug/kg/min via INTRAVENOUS
  Filled 2018-03-05 (×8): qty 100

## 2018-03-05 MED ORDER — ISOSORBIDE MONONITRATE ER 30 MG PO TB24
30.0000 mg | ORAL_TABLET | Freq: Every day | ORAL | Status: DC
  Administered 2018-03-06 – 2018-03-08 (×3): 30 mg via ORAL
  Filled 2018-03-05 (×3): qty 1

## 2018-03-05 MED ORDER — SODIUM CHLORIDE 0.9 % IV SOLN
250.0000 mL | INTRAVENOUS | Status: DC | PRN
  Administered 2018-03-05 – 2018-03-06 (×2): 250 mL via INTRAVENOUS

## 2018-03-05 MED ORDER — FUROSEMIDE 10 MG/ML IJ SOLN
80.0000 mg | Freq: Two times a day (BID) | INTRAMUSCULAR | Status: DC
  Administered 2018-03-05 – 2018-03-06 (×3): 80 mg via INTRAVENOUS
  Filled 2018-03-05 (×4): qty 8

## 2018-03-05 MED ORDER — ORAL CARE MOUTH RINSE
15.0000 mL | Freq: Two times a day (BID) | OROMUCOSAL | Status: DC
  Administered 2018-03-08 – 2018-03-14 (×10): 15 mL via OROMUCOSAL

## 2018-03-05 MED ORDER — CARBOXYMETHYLCELLULOSE SODIUM 0.5 % OP SOLN: 2.0000 [drp] | Freq: Every day | OPHTHALMIC | Status: DC

## 2018-03-05 MED ORDER — METOLAZONE 2.5 MG PO TABS: 2.5000 mg | ORAL_TABLET | ORAL | 3 refills | Status: AC

## 2018-03-05 MED ORDER — SODIUM CHLORIDE 0.9% FLUSH
3.0000 mL | INTRAVENOUS | Status: DC | PRN
  Administered 2018-03-13: 3 mL via INTRAVENOUS
  Filled 2018-03-05: qty 3

## 2018-03-05 MED ORDER — ISOSORBIDE MONONITRATE ER 60 MG PO TB24: 60.0000 mg | ORAL_TABLET | Freq: Every day | ORAL | Status: DC

## 2018-03-05 MED ORDER — GABAPENTIN 300 MG PO CAPS
600.0000 mg | ORAL_CAPSULE | Freq: Three times a day (TID) | ORAL | Status: DC
  Administered 2018-03-05 – 2018-03-10 (×16): 600 mg via ORAL
  Filled 2018-03-05 (×16): qty 2

## 2018-03-05 MED ORDER — VITAMIN D 25 MCG (1000 UNIT) PO TABS
1000.0000 [IU] | ORAL_TABLET | Freq: Every day | ORAL | Status: DC
  Administered 2018-03-06 – 2018-03-10 (×5): 1000 [IU] via ORAL
  Filled 2018-03-05 (×7): qty 1

## 2018-03-05 MED ORDER — ACETAMINOPHEN 325 MG PO TABS
650.0000 mg | ORAL_TABLET | ORAL | Status: DC | PRN
  Filled 2018-03-05: qty 2

## 2018-03-05 MED ORDER — SODIUM CHLORIDE 0.9% FLUSH
10.0000 mL | Freq: Two times a day (BID) | INTRAVENOUS | Status: DC
  Administered 2018-03-06 – 2018-03-13 (×13): 10 mL

## 2018-03-05 MED ORDER — POTASSIUM CHLORIDE CRYS ER 20 MEQ PO TBCR
40.0000 meq | EXTENDED_RELEASE_TABLET | Freq: Once | ORAL | Status: AC
  Administered 2018-03-05: 40 meq via ORAL
  Filled 2018-03-05: qty 2

## 2018-03-05 MED ORDER — INSULIN ASPART 100 UNIT/ML ~~LOC~~ SOLN
3.0000 [IU] | Freq: Three times a day (TID) | SUBCUTANEOUS | Status: DC
  Administered 2018-03-06 – 2018-03-12 (×16): 3 [IU] via SUBCUTANEOUS

## 2018-03-05 MED ORDER — HYDRALAZINE HCL 50 MG PO TABS
25.0000 mg | ORAL_TABLET | Freq: Three times a day (TID) | ORAL | Status: DC
  Administered 2018-03-05 – 2018-03-09 (×11): 25 mg via ORAL
  Filled 2018-03-05 (×11): qty 1

## 2018-03-05 MED ORDER — IPRATROPIUM BROMIDE 0.06 % NA SOLN
2.0000 | Freq: Every day | NASAL | Status: DC
  Administered 2018-03-06 – 2018-03-13 (×7): 2 via NASAL
  Filled 2018-03-05: qty 15

## 2018-03-05 MED ORDER — ALLOPURINOL 100 MG PO TABS
200.0000 mg | ORAL_TABLET | Freq: Every day | ORAL | Status: DC
  Administered 2018-03-06 – 2018-03-10 (×5): 200 mg via ORAL
  Filled 2018-03-05 (×6): qty 2

## 2018-03-05 MED ORDER — HYDRALAZINE HCL 25 MG PO TABS
37.5000 mg | ORAL_TABLET | Freq: Three times a day (TID) | ORAL | Status: DC
  Filled 2018-03-05: qty 1.5

## 2018-03-05 MED ORDER — DULOXETINE HCL 30 MG PO CPEP
30.0000 mg | ORAL_CAPSULE | Freq: Every day | ORAL | Status: DC
  Administered 2018-03-06 – 2018-03-10 (×5): 30 mg via ORAL
  Filled 2018-03-05 (×5): qty 1

## 2018-03-05 NOTE — H&P (Addendum)
Advanced Heart Failure Clinic Note  PCP: Dr. Quay Burow Cardiologist: Dr Aundra Dubin   HPI: [name] is a 77 y.o. with history of CAD s/p CABG, permanent atrial fib, S/P AAA repair, ischemic cardiomyopathy/chronic systolic heart failure,  Medtronic CRT-D system.   Admitted 4/14 through 07/06/16 with volume overload. Diuresed with IV lasix and transitioned to torsemide 60 mg daily. HF meds adjusted and he was started on Entresto. Discharge weight was 220 pounds.  He now has a Cardiomems device. Torsemide has been increased to 80 mg bid based on Cardiomems evaluation.   Echo in 4/19 showed EF improved to 40-45%.  He stopped eplerenone due to breast pain which has resolved (same thing with spironolactone).   RHC was done in 7/19, PA pressure from [place] matched Cardiomems (there had been some question of Cardiomems accuracy).   Admitted 01/2018 for left thoracotomy and epicardial lead placement by [facility]. Unfortunately that was not successful. He was discharged on 02/02/2018. Creatinine at the time of discharge was 1.5.   He was seen in HF clinic 12/5 and was volume overloaded with AKI. He was set up for [place] on 12/6 and was admitted with low output and volume overload. Per Dr Aundra Dubin, cardiomems and [place] numbers correlated. PICC was placed and he was started on milrinone and IV lasix. Transitioned to torsemide 100 mg BID and added metolazone 2.5 mg weekly. Milrinone slowly weaned off with adequate coox. Renal function monitored closely. Creatinine peaked at 2.87 and came back to 2.63 by day of discharge. Entresto and digoxin stopped with AKI. Started on hydralazine and imdur. Coreg dose was decreased. DC weight: 214 lbs  He presented today for post hospital follow up. Pt's wife called HF clinic on 12/12 with complaints of edema, vomiting, and weakness. Weight was down to 211, BP 149/87, HR 99. Dr Aundra Dubin instructed him to take metolazone 2 days in a row and coreg was increased. Overall doing poorly. No further  vomiting. He has a poor appetite and abdominal distension. He does not urinate much with metolazone. Urine is dark per wife. He is SOB with minimal activity, but has not been active. Very fatigued. Worsening BLE edema. Denies orthopnea or PND.  He is wearing O2 PRN. He has a cough with clear sputum. No fever or chills. No bleeding. He has CP on and off on left side, but has been present since thoracotomy in November. Describes as "shooting". No change in frequency or severity. Weights down 214 > 211 lbs on home scale. No missed medications. Wife requests that we draw INR today so he does not have to get stuck again tomorrow.   Cardiomems [place] on 12/16: PAD 31 (goal is 23)  PMH: 1. CAD: s/p CABG.  - LHC (12/17): totally occluded distal RCA, totally occluded mid LAD, totally occluded OM2 and OM3.  LIMA-LAD patent with 60% ostial stenosis.  SVG-OM3 and SVG-PDA were patent. Medical management.  2. Chronic systolic CHF: Ischemic cardiomyopathy.  He has a Medtronic CRT-D device but LV lead not turned on due to high threshold.  - Echo (12/17) with EF 35-40%, diffuse hypokinesis, normal RV size and systolic function, mild MR.  - CardioMems placed.  - Painful gynecomastia with spironolactone and eplerenone.  - Echo (4/19) with EF 40-45%, mild MR.  - RHC (7/19) with mean RA 9, PA 43/21 mean 30, mean PCWP 19, CI 2.15, PVR 2.3 WU (PA pressure matched Cardiomems).  - Echo (10/19): EF 35-40%, mild LVH, septal-lateral dyssynchrony, grade II diastolic dysfunction, moderate MR, mildly  dilated RV with mildly decreased systolic function 3. AAA: s/p repair.  4. Atrial fibrillation: Permanent.  5. Gout 6. PAD: Saw Dr Scot Dock with [facility] in 4/18.  Noninvasive study concerning for infra-inguinal arterial occlusive disease bilaterally but appeared to have adequate circulation to heal toe ulcer.  7. CKD stage 3.  8. Ascending aortic aneurysm: CT chest 3/18 ascending aorta 4.9 cm, arch 4.4 cm.  - CTA chest (5/19): 5.2 cm  ascending aorta.  9. Spinal stenosis 10. Polycythemia vera 11. Hyperlipidemia 12. Type II diabetes    Review of systems complete and found to be negative unless listed in HPI.   Social History   Socioeconomic History  . Marital status: Married    Spouse name: Not on file  . Number of children: Not on file  . Years of education: Not on file  . Highest education level: Not on file  Occupational History  . Occupation: Retired- Nurse, mental health  Social Needs  . Financial resource strain: Not on file  . Food insecurity:    Worry: Not on file    Inability: Not on file  . Transportation needs:    Medical: Not on file    Non-medical: Not on file  Tobacco Use  . Smoking status: Former Smoker    Packs/day: 3.00    Years: 39.00    Pack years: 117.00    Types: Cigarettes    Start date: 05/24/1956    Last attempt to quit: 03/22/1995    Years since quitting: 22.9  . Smokeless tobacco: Never Used  Substance and Sexual Activity  . Alcohol use: Yes    Alcohol/week: 2.0 standard drinks    Types: 2 Cans of beer per week  . Drug use: No  . Sexual activity: Not Currently  Lifestyle  . Physical activity:    Days per week: Not on file    Minutes per session: Not on file  . Stress: Not on file  Relationships  . Social connections:    Talks on phone: Not on file    Gets together: Not on file    Attends religious service: Not on file    Active member of club or organization: Not on file    Attends meetings of clubs or organizations: Not on file    Relationship status: Not on file  . Intimate partner violence:    Fear of current or ex partner: Not on file    Emotionally abused: Not on file    Physically abused: Not on file    Forced sexual activity: Not on file  Other Topics Concern  . Not on file  Social History Narrative  . Not on file    Family History  Problem Relation Age of Onset  . Hypertension Mother   . Stroke Mother   . Diabetes Father   . Coronary artery  disease Father   . Other Father        DVT  . Diabetes Brother   . Colon cancer Neg Hx   . Heart attack Neg Hx      Current Facility-Administered Medications  Medication Dose Route Frequency Provider Last Rate Last Dose  . 0.9 %  sodium chloride infusion  250 mL Intravenous PRN Georgiana Shore, NP      . acetaminophen (TYLENOL) tablet 650 mg  650 mg Oral Q4H PRN Georgiana Shore, NP      . allopurinol (ZYLOPRIM) tablet 200 mg  200 mg Oral Daily Georgiana Shore, NP      .  atorvastatin (LIPITOR) tablet 10 mg  10 mg Oral Daily Georgiana Shore, NP      . carboxymethylcellulose (REFRESH PLUS) 0.5 % ophthalmic solution 2 drop  2 drop Both Eyes Daily Georgiana Shore, NP      . cholecalciferol (VITAMIN D) tablet 1,000 Units  1,000 Units Oral Daily Georgiana Shore, NP      . DULoxetine (CYMBALTA) DR capsule 30 mg  30 mg Oral Daily Georgiana Shore, NP      . furosemide (LASIX) injection 80 mg  80 mg Intravenous BID Georgiana Shore, NP      . gabapentin (NEURONTIN) capsule 600 mg  600 mg Oral TID Georgiana Shore, NP      . hydrALAZINE (APRESOLINE) tablet 37.5 mg  37.5 mg Oral Q8H Georgiana Shore, NP      . insulin aspart (novoLOG) injection 0-15 Units  0-15 Units Subcutaneous TID WC Georgiana Shore, NP      . insulin aspart (novoLOG) injection 0-5 Units  0-5 Units Subcutaneous QHS Georgiana Shore, NP      . insulin aspart (novoLOG) injection 3 Units  3 Units Subcutaneous TID WC Georgiana Shore, NP      . ipratropium (ATROVENT) 0.06 % nasal spray 2 spray  2 spray Each Nare Daily Georgiana Shore, NP      . isosorbide mononitrate (IMDUR) 24 hr tablet 60 mg  60 mg Oral Daily Georgiana Shore, NP      . milrinone (PRIMACOR) 20 MG/100 ML (0.2 mg/mL) infusion  0.25 mcg/kg/min Intravenous Continuous Georgiana Shore, NP      . ondansetron [facility]) injection 4 mg  4 mg Intravenous Q6H PRN Georgiana Shore, NP      . pantoprazole (PROTONIX) EC tablet 40 mg  40 mg Oral Daily Georgiana Shore, NP      . sodium  chloride flush (NS) 0.9 % injection 3 mL  3 mL Intravenous Q12H Georgiana Shore, NP      . sodium chloride flush (NS) 0.9 % injection 3 mL  3 mL Intravenous PRN Georgiana Shore, NP       Current Outpatient Medications  Medication Sig Dispense Refill  . ACCU-CHEK AVIVA PLUS test strip USE 1 STRIP TO CHECK GLUCOSE THREE TIMES DAILY AS DIRECTED 300 each 3  . ACCU-CHEK FASTCLIX LANCETS MISC USE 1 LANCET TO CHECK GLUCOSE THREE TIMES DAILY 102 each 5  . acetaminophen (TYLENOL) 325 MG tablet Take 650-975 mg by mouth 3 (three) times daily as needed for moderate pain.     Marland Kitchen allopurinol (ZYLOPRIM) 100 MG tablet TAKE TWO TABLETS BY MOUTH ONCE DAILY (Patient taking differently: Take 200 mg by mouth every evening. ) 180 tablet 3  . atorvastatin (LIPITOR) 10 MG tablet Take 1 tablet (10 mg total) by mouth daily. 90 tablet 2  . carboxymethylcellulose (REFRESH PLUS) 0.5 % SOLN Place 2 drops into both eyes daily.     . carvedilol (COREG) 12.5 MG tablet Take 1 tablet (12.5 mg total) by mouth 2 (two) times daily with a meal. 60 tablet 3  . cholecalciferol (VITAMIN D) 1000 units tablet Take 1,000 Units by mouth daily.    . colchicine 0.6 MG tablet Take 0.6 mg by mouth daily as needed (gout flares).     . DULoxetine (CYMBALTA) 30 MG capsule TAKE 1 CAPSULE BY MOUTH ONCE DAILY (Patient taking differently: Take 30 mg by mouth daily. ) 90 capsule 1  . gabapentin (NEURONTIN)  300 MG capsule Take 2 capsules (600 mg total) by mouth 3 (three) times daily. TAKE ONE CAPSULE BY MOUTH 5 TIMES DAILY AS NEEDED FOR LEG PAIN (Patient taking differently: Take 600-900 mg by mouth 3 (three) times daily. ) 540 capsule 3  . hydrALAZINE (APRESOLINE) 25 MG tablet Take 1.5 tablets (37.5 mg total) by mouth every 8 (eight) hours. 135 tablet 5  . insulin NPH Human (NOVOLIN N) 100 UNIT/ML injection Inject 6-8 Units into the skin See admin instructions. Inject 8 units subcutaneously in the morning and inject 6 units subcutaneously in the evening.      . insulin regular (NOVOLIN R) 100 units/mL injection Inject 6 Units into the skin daily before supper.     Marland Kitchen ipratropium (ATROVENT) 0.06 % nasal spray Place 2 sprays into both nostrils daily.     . isosorbide mononitrate (IMDUR) 60 MG 24 hr tablet Take 1 tablet (60 mg total) by mouth daily. 30 tablet 5  . methocarbamol (ROBAXIN) 750 MG tablet Take 1 tablet (750 mg total) by mouth 2 (two) times daily as needed for muscle spasms. 90 tablet 1  . metolazone (ZAROXOLYN) 2.5 MG tablet Take 1 tablet (2.5 mg total) by mouth every other day. 45 tablet 3  . metroNIDAZOLE (METROGEL) 0.75 % gel Apply 1 application topically daily as needed (rosacea).   2  . nitroGLYCERIN (NITROSTAT) 0.4 MG SL tablet Place 1 tablet (0.4 mg total) under the tongue every 5 (five) minutes as needed for chest pain (MAX 3 TABLETS). 25 tablet 3  . omeprazole (PRILOSEC) 20 MG capsule Take 2 capsules (40 mg total) by mouth 2 (two) times daily. (Patient taking differently: Take 40 mg by mouth daily. ) 120 capsule 11  . potassium chloride SA (K-DUR,KLOR-CON) 20 MEQ tablet Take 2 tablets (40 mEq total) by mouth daily. 90 tablet 3  . torsemide (DEMADEX) 100 MG tablet Take 1 tablet (100 mg total) by mouth 2 (two) times daily. 60 tablet 3  . warfarin (COUMADIN) 2.5 MG tablet Take 5 mg on Wednesday, 12/11. TAKE 2 TABLETS (5 MG) BY MOUTH ON TUESDAYS, THEN TAKE 1 TABLET (2.5 MG) BY MOUTH ON ALL OTHER DAYS. 120 tablet 1   Vitals from HF clinic BP: 130/80  Pulse: (!) 59  SpO2: 95%  Weight: 96.1 kg (211 lb 12.8 oz)   Wt Readings from Last 3 Encounters:  03/17/2018 96.1 kg  02/28/18 97 kg  02/22/18 99.3 kg   PHYSICAL EXAM: General:   No resp difficulty. HEENT: Normal Neck: Supple. JVP ~12. Carotids 2+ bilat; no bruits. No thyromegaly or nodule noted. Cor: PMI nondisplaced. RRR, No M/G/R noted Lungs: CTAB, normal effort. Abdomen: Soft, non-tender, +distended, no HSM. No bruits or masses. +BS   Extremities: No cyanosis, clubbing, or rash.  R and LLE 2+ edema 3/4 to knees.   Neuro: Alert & orientedx3, cranial nerves grossly intact. moves all 4 extremities w/o difficulty. Affect pleasant    ASSESSMENT & PLAN: 1. Chronic Systolic Heart Failure: Ischemic cardiomyopathy. 02/2018 Echo, EF 35-40%, stable. Medtronic CRT-D but LV lead turned off due to high threshold.  11/20109 Unable to place epicardial lead. Recent admission required milrinone. - Volume elevated on exam and cardiomems - Will admit for IV diuresis due to worsening renal function. Initially he declined admission, but called back with worsening labs.  - No digoxin/entresto/spiro with CKD - Continue hydralazine 37.5 mg TID + imdur 60 mg daily - Hold coreg with low output.  - Unable to take eplerenone or spironolactone  due to breast tenderness.  This has mostly resolved off these meds.   2. Atrial fibrillation: Permanent.  Rate controlled.  Continue warfarin. Denies bleeding.  3. CAD: s/p CABG.  He has atypical chest pain.  - No ASA given warfarin use.  - Continue statin.  Good lipids 4/19.   4. AKI on CKD Stage III:  Creatinine leveled off ~2.6 while inpatient.  - Cr back up to 3.86. Will admit and follow.  5. Gout: Stable. Continue allopurinol.  6. Ascending aortic aneurysm: 5/19 CT chest with 5.2 cm ascending aorta.  He saw Dr. Servando Snare.  Aneurysm has been relatively stable, would be high risk for operation.  - Keep SBP < 130  Will admit for PICC, milrinone, and IV diuresis.   Shirley Friar, PA-C  02/18/2018  Advanced Heart Failure Team Pager [phone] (M-F; 7a - 4p)  Please contact [facility] for night-coverage after hours (4p -7a ) and weekends on [email or web address]  Patient seen with PA, agree with the above note.  He was recently admitted with acute on chronic systolic CHF and cardiorenal syndrome.  He was on milrinone while in the hospital.  Gradually stabilized and discharged on torsemide 100 mg bid + metolazone once a week.  Creatinine 2.6 at discharge  and had been stable for several days.   At home, poor urine output and ongoing significant exertional dyspnea.  Seen in clinic today, creatinine up to 3.86.   Cardiomems PADP 31  On exam, JVP 12-14 cm with 2+ edema 3/4 to knees bilaterally.  Irregular S1S2.  Clear lungs.   1.Acute on chronic systolic CHF: Ischemic cardiomyopathy. 02/2018 Echo, EF 35-40%, stable. Medtronic CRT-D but LV lead turned off due to high threshold. 11/20109 Unable to place epicardial lead surgically. Patient was admitted earlier in 12/19 after [place] on 02/23/18 showed low output with CI 1.9 and elevated filling pressures. Creatinine was elevated to the 2.9 range.   He was started on milrinone and diuresed.  Milrinone weaned and he was sent home.  Now with progressively worsening renal function (creatinine up to 3.8) and ongoing volume overload with PADP 31 by Cardiomems.  We will admit him today.  - Place PICC and follow CVP and co-ox.  - Start milrinone 0.25 mcg/kg/min again empirically . - Start with Lasix 80 mg IV bid.  -Continue Coreg at 3.125 mg bid for now for rate control.  - Decrease hydralazine to 25 mg tid and continue Imdur 30 daily.  - No ACEI/ARB/ARNI/spironolactone with AKI.  2. Atrial fibrillation: Permanent.  -Continue warfarin.   - Continue Coreg for rate control as above. 3. CAD: s/p CABG. No recent chest pain.  - No ASA given warfarin use.  - Continue statin. Good lipids 4/19.  4.AKI onCKD Stage III: Progressive cardiorenal syndrome, creatinine up to 3.8.  Make sure to maintain BP (can back off on hydralazine if needed but per patient BP has been ok at home).  - Will add back milrinone as above to see if that helps.  - Will ask nephrology to see in am.  - If renal function continues to worsen, he would likely be a dialysis candidate.   Loralie Champagne 03/12/2018 5:06 PM

## 2018-03-05 NOTE — Progress Notes (Signed)
[place] for warfarin Indication: atrial fibrillation  Allergies  Allergen Reactions  . Avelox [Moxifloxacin Hcl In Nacl] Swelling, Rash and Other (See Comments)    Patient became hypotensive after infusion started Because of a history of documented adverse serious drug reaction;Medi Alert bracelet  is recommended  . Other Rash and Swelling    Patient became hypotensive after infusion started Because of a history of documented adverse serious drug reaction;Medi Alert braceletis recommended  . Penicillins Anaphylaxis, Swelling and Other (See Comments)    Because of a history of documented adverse serious drug reaction;Medi Alert bracelet  is recommended Has patient had a PCN reaction causing immediate rash, facial/tongue/throat swelling, SOB or lightheadedness with hypotension: Yes Has patient had a PCN reaction causing severe rash involving mucus membranes or skin necrosis: No Has patient had a PCN reaction that required hospitalization: Unknown Has patient had a PCN reaction occurring within the last 10 years: No  . Tape Other (See Comments)    "skin is thin; plastic tape rips skin off when removed; please use paper tape"/spouse    Patient Measurements:    Vital Signs: Temp: 98 F (36.7 C) (12/16 1532) Temp Source: Axillary (12/16 1532) BP: 104/62 (12/16 1532) Pulse Rate: 76 (12/16 1532)  Labs: Recent Labs    02/20/2018 1223  HGB 13.1  HCT 39.8  PLT 217  LABPROT 44.0*  INR 4.77*  CREATININE 3.86*    Estimated Creatinine Clearance: 19.6 mL/min (A) (by C-G formula based on SCr of 3.86 mg/dL (H)).   Medical History: Past Medical History:  Diagnosis Date  . AAA (abdominal aortic aneurysm) ([place])    Surgical repair 11/2002.  . Adenomatous colon polyp   . AICD (automatic cardioverter/defibrillator) present   . Alcohol ingestion of more than four drinks per week    Excess beer  not a dependency problem  . Aortic valve sclerosis    . Arthritis   . Atrial fibrillation ([place])    Previous long-term amiodarone therapy with multiple cardioversions / amiodarone stopped September, 2009  . Atrial flutter [facility])    Started November, 2010, Left-sided and cannot ablate  . Bony abnormality    Patient's manubrium is slightly displaced to the right  . CAD (coronary artery disease)    a. s/p CABG 2004;  b. 02/2016 Cath: LM nl, LAD 70p, 163m, D1 50, D2 50, RI 50ost, LCX 85m, OM2 100, OM3 100, RCA 70p, 100d, VG->OM3 ok, LIMA->LAD 60ost, VG->RPDA  ok.  . 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 01/2013 MDT DTBA 1D1 [place] CRT-D, ser # [id number].  . Carotid artery disease ([place])    Doppler, December, 2013, 0-39% bilateral  . Chronic systolic CHF (congestive heart failure) ([place])    a. 02/2016 Echo: EF 35-40%, diff HK, triv AI, mildly dil Ao root 7mm, mild MR, sev dil LA, triv TR.  Marland Kitchen Chronotropic incompetence    IV pacing rate adjusted  . CKD (chronic kidney disease), stage III ([place])   . COPD (chronic obstructive pulmonary disease) ([place])   . Dilated aortic root ([place])   . Discolored skin   . Diverticulosis   . Drug therapy    Redness and swelling with Avelox infusion May 24, 2011  . Dyspnea   . Eye abnormality    Ophthalmologist questions a clot in one of his eyes, May, 2012  . Gout   . Hyperlipidemia   . Hypertension   . Internal hemorrhoids   . Ischemic  cardiomyopathy   . Left atrial thrombus    Remote past... cardioversions done since that time  . Mitral regurgitation    Mild echo  . Myocardial infarction ([place])   . Nasal drainage    Chronic  . On home oxygen therapy    "2L prn; ~  4h/day" (03/13/2018)  . Overweight(278.02)   . Pericardial effusion   . Pleural effusion    Large loculated effusion on the left side November, 2011. This was tapped. It was exudative. Cytology revealed no cancer no proof of mesothelioma area pulmonary team felt that no further workup was needed  . Pleural  thickening   . Pneumonia   . Polycythemia vera ([place]) 07/28/2014  . Presence of permanent cardiac pacemaker   . SOB (shortness of breath)    Large left effusion/ thoracentesis/hospitalization/November, 2011... exudated.. cytology negative.. Dr.Wert.. no proof of mesothelioma  . Spinal stenosis    Surgery Dr.Elsner  . Thrombophlebitis of superficial veins of upper extremities    Possible venous stenosis from defibrillator  . Type II diabetes mellitus ([place]) 2008  . Venous insufficiency    Toe discoloration chronic  . Ventral hernia    April, 2014, result of his abdominal surgery  . Warfarin anticoagulation   . Wide-complex tachycardia ([place])     Assessment: 77 yoM recent bounce back admitted 12/16 for severe volume overload and weakness. Pt on warfarin PTA for hx of PAF with last dose 12/4 (pt advised to hold on 12/5 after clinic visit).   INR on admit today was 4.77, home regimen is 2.5mg  daily except 5mg  Tuesday. He was high on his previous admit but INR trended down and was discharged back on previous dose. I believe his volume status is causing some hepatic congestion making his warfarin dosing very difficult. It could also be limited po intake? Irregardless will hold warfarin for now and follow trend closely. CBC appears within normal limits and no bleeding was noted.    Goal of Therapy:  INR 2-3 Monitor platelets by anticoagulation protocol: Yes   Plan:  -Hold warfarin for now -Monitor daily INR, CBC, s/sx bleeding  Erin Hearing PharmD., BCPS Clinical Pharmacist 03/12/2018 4:38 PM

## 2018-03-05 NOTE — Progress Notes (Signed)
Peripherally Inserted Central Catheter/Midline Placement  The IV Nurse has discussed with the patient and/or persons authorized to consent for the patient, the purpose of this procedure and the potential benefits and risks involved with this procedure.  The benefits include less needle sticks, lab draws from the catheter, and the patient may be discharged home with the catheter. Risks include, but not limited to, infection, bleeding, blood clot (thrombus formation), and puncture of an artery; nerve damage and irregular heartbeat and possibility to perform a PICC exchange if needed/ordered by physician.  Alternatives to this procedure were also discussed.  [facility] PICC patient education guide, fact sheet on infection prevention and patient information card has been provided to patient /or left at bedside.    PICC/Midline Placement Documentation  PICC Triple Lumen 03/29/30 PICC Left Basilic 49 cm 0 cm (Active)  Indication for Insertion or Continuance of Line Administration of hyperosmolar/irritating solutions (i.e. TPN, Vancomycin, etc.) 03/06/2018 11:00 PM  Exposed Catheter (cm) 0 cm 02/21/2018 11:00 PM  Site Assessment Clean;Dry;Intact 03/18/2018 11:00 PM  Lumen #1 Status Flushed;Saline locked;Blood return noted 03/18/2018 11:00 PM  Lumen #2 Status Flushed;Saline locked;Blood return noted 03/20/2018 11:00 PM  Lumen #3 Status Flushed;Saline locked;Blood return noted 02/26/2018 11:00 PM  Dressing Type Transparent;Securing device 03/04/2018 11:00 PM  Dressing Status Clean;Dry;Intact;Antimicrobial disc in place 03/17/2018 11:00 PM  Line Care Connections checked and tightened 02/23/2018 11:00 PM  Dressing Change Due 03/12/18 02/25/2018 11:00 PM       Virgilio Belling 02/28/2018, 11:57 PM

## 2018-03-05 NOTE — Progress Notes (Signed)
Paged IV team regarding pending PICC order- patient is on list and will be seen as soon as possible. Not able to obtain CVP or coox. Milrinone running through peripheral IV.

## 2018-03-05 NOTE — Progress Notes (Signed)
Per MD order, okay to place PICC.  Patient not a candidate for dialysis

## 2018-03-05 NOTE — Patient Instructions (Signed)
CHANGE Metolazone to one tab every other day  Labs today We will only contact you if something comes back abnormal or we need to make some changes. Otherwise no news is good news!   Your physician recommends that you schedule a follow-up appointment in: 1 week  in the Advanced Practitioners (PA/NP) Clinic    Do the following things EVERYDAY: 1) Weigh yourself in the morning before breakfast. Write it down and keep it in a log. 2) Take your medicines as prescribed 3) Eat low salt foods-Limit salt (sodium) to 2000 mg per day.  4) Stay as active as you can everyday 5) Limit all fluids for the day to less than 2 liters

## 2018-03-05 NOTE — Addendum Note (Signed)
Encounter addended by: Georgiana Shore, NP on: 02/24/2018 4:47 PM  Actions taken: Charge Capture section accepted, LOS modified

## 2018-03-06 ENCOUNTER — Inpatient Hospital Stay (HOSPITAL_COMMUNITY): Payer: Medicare Other

## 2018-03-06 DIAGNOSIS — I5023 Acute on chronic systolic (congestive) heart failure: Secondary | ICD-10-CM

## 2018-03-06 LAB — BASIC METABOLIC PANEL
Anion gap: 18 — ABNORMAL HIGH (ref 5–15)
Anion gap: 18 — ABNORMAL HIGH (ref 5–15)
BUN: 93 mg/dL — ABNORMAL HIGH (ref 8–23)
BUN: 87 mg/dL — ABNORMAL HIGH (ref 8–23)
CHLORIDE: 85 mmol/L — AB (ref 98–111)
CO2: 26 mmol/L (ref 22–32)
CO2: 27 mmol/L (ref 22–32)
CREATININE: 3.56 mg/dL — AB (ref 0.61–1.24)
Calcium: 8.4 mg/dL — ABNORMAL LOW (ref 8.9–10.3)
Calcium: 8.4 mg/dL — ABNORMAL LOW (ref 8.9–10.3)
Chloride: 87 mmol/L — ABNORMAL LOW (ref 98–111)
Creatinine, Ser: 3.45 mg/dL — ABNORMAL HIGH (ref 0.61–1.24)
GFR calc Af Amer: 18 mL/min — ABNORMAL LOW (ref >60)
GFR calc Af Amer: 19 mL/min — ABNORMAL LOW (ref >60)
GFR calc non Af Amer: 16 mL/min — ABNORMAL LOW (ref >60)
GFR calc non Af Amer: 16 mL/min — ABNORMAL LOW (ref >60)
Glucose, Bld: 161 mg/dL — ABNORMAL HIGH (ref 70–99)
Glucose, Bld: 193 mg/dL — ABNORMAL HIGH (ref 70–99)
Potassium: 2.7 mmol/L — CL (ref 3.5–5.1)
Potassium: 3.1 mmol/L — ABNORMAL LOW (ref 3.5–5.1)
Sodium: 131 mmol/L — ABNORMAL LOW (ref 135–145)
Sodium: 130 mmol/L — ABNORMAL LOW (ref 135–145)

## 2018-03-06 LAB — GLUCOSE, CAPILLARY
Glucose-Capillary: 170 mg/dL — ABNORMAL HIGH (ref 70–99)
Glucose-Capillary: 138 mg/dL — ABNORMAL HIGH (ref 70–99)
Glucose-Capillary: 119 mg/dL — ABNORMAL HIGH (ref 70–99)
Glucose-Capillary: 178 mg/dL — ABNORMAL HIGH (ref 70–99)

## 2018-03-06 LAB — COOXEMETRY PANEL
Carboxyhemoglobin: 1.3 % (ref 0.5–1.5)
Methemoglobin: 1.5 % (ref 0.0–1.5)
O2 Saturation: 65.4 %
Total hemoglobin: 13.4 g/dL (ref 12.0–16.0)

## 2018-03-06 LAB — PROTIME-INR
INR: 5.3
Prothrombin Time: 47.7 seconds — ABNORMAL HIGH (ref 11.4–15.2)

## 2018-03-06 LAB — CBC
HEMATOCRIT: 37.8 % — AB (ref 39.0–52.0)
Hemoglobin: 12.3 g/dL — ABNORMAL LOW (ref 13.0–17.0)
MCH: 29.1 pg (ref 26.0–34.0)
MCHC: 32.5 g/dL (ref 30.0–36.0)
MCV: 89.6 fL (ref 80.0–100.0)
Platelets: 203 10*3/uL (ref 150–400)
RBC: 4.22 MIL/uL (ref 4.22–5.81)
RDW: 15.3 % (ref 11.5–15.5)
WBC: 6 10*3/uL (ref 4.0–10.5)
nRBC: 0 % (ref 0.0–0.2)

## 2018-03-06 MED ORDER — POTASSIUM CHLORIDE CRYS ER 10 MEQ PO TBCR
40.0000 meq | EXTENDED_RELEASE_TABLET | Freq: Once | ORAL | Status: AC
  Administered 2018-03-06: 40 meq via ORAL
  Filled 2018-03-06 (×2): qty 4

## 2018-03-06 MED ORDER — POTASSIUM CHLORIDE CRYS ER 20 MEQ PO TBCR
40.0000 meq | EXTENDED_RELEASE_TABLET | ORAL | Status: AC
  Administered 2018-03-06 (×2): 40 meq via ORAL
  Filled 2018-03-06 (×2): qty 2

## 2018-03-06 MED ORDER — POTASSIUM CHLORIDE CRYS ER 20 MEQ PO TBCR
60.0000 meq | EXTENDED_RELEASE_TABLET | Freq: Once | ORAL | Status: AC
  Administered 2018-03-06: 60 meq via ORAL
  Filled 2018-03-06: qty 3

## 2018-03-06 MED ORDER — WARFARIN - PHARMACIST DOSING INPATIENT
Freq: Every day | Status: DC
  Administered 2018-03-14: 22:00:00

## 2018-03-06 MED ORDER — POTASSIUM CHLORIDE CRYS ER 20 MEQ PO TBCR: 60.0000 meq | EXTENDED_RELEASE_TABLET | Freq: Once | ORAL | Status: DC

## 2018-03-06 NOTE — Progress Notes (Signed)
[place] for warfarin Indication: atrial fibrillation  Allergies  Allergen Reactions  . Avelox [Moxifloxacin Hcl In Nacl] Swelling, Rash and Other (See Comments)    Patient became hypotensive after infusion started Because of a history of documented adverse serious drug reaction;Medi Alert bracelet  is recommended  . Other Rash and Swelling    Patient became hypotensive after infusion started Because of a history of documented adverse serious drug reaction;Medi Alert braceletis recommended  . Penicillins Anaphylaxis, Swelling and Other (See Comments)    Because of a history of documented adverse serious drug reaction;Medi Alert bracelet  is recommended Has patient had a PCN reaction causing immediate rash, facial/tongue/throat swelling, SOB or lightheadedness with hypotension: Yes Has patient had a PCN reaction causing severe rash involving mucus membranes or skin necrosis: No Has patient had a PCN reaction that required hospitalization: Unknown Has patient had a PCN reaction occurring within the last 10 years: No  . Tape Other (See Comments)    "skin is thin; plastic tape rips skin off when removed; please use paper tape"/spouse    Patient Measurements: Height: 6' (182.9 cm) Weight: 210 lb 1.6 oz (95.3 kg)(Scale A) IBW/kg (Calculated) : 77.6  Vital Signs: Temp: 97.9 F (36.6 C) (12/17 0415) Temp Source: Oral (12/17 0415) BP: 117/81 (12/17 0330) Pulse Rate: 78 (12/17 0330)  Labs: Recent Labs    02/26/2018 1223 03/06/18 0405  HGB 13.1 12.3*  HCT 39.8 37.8*  PLT 217 203  LABPROT 44.0* 47.7*  INR 4.77* 5.30*  CREATININE 3.86* 3.56*    Estimated Creatinine Clearance: 21.1 mL/min (A) (by C-G formula based on SCr of 3.56 mg/dL (H)).   Medical History: Past Medical History:  Diagnosis Date  . AAA (abdominal aortic aneurysm) ([place])    Surgical repair 11/2002.  . Adenomatous colon polyp   . AICD (automatic cardioverter/defibrillator)  present   . Alcohol ingestion of more than four drinks per week    Excess beer  not a dependency problem  . Aortic valve sclerosis   . Arthritis    "all over" (03/18/2018)  . Atrial fibrillation ([place])    Previous long-term amiodarone therapy with multiple cardioversions / amiodarone stopped September, 2009  . Atrial flutter [facility])    Started November, 2010, Left-sided and cannot ablate  . Bony abnormality    Patient's manubrium is slightly displaced to the right  . CAD (coronary artery disease)    a. s/p CABG 2004;  b. 02/2016 Cath: LM nl, LAD 70p, 156m, D1 50, D2 50, RI 50ost, LCX 32m, OM2 100, OM3 100, RCA 70p, 100d, VG->OM3 ok, LIMA->LAD 60ost, VG->RPDA  ok.  Marland Kitchen CAD (coronary artery disease)    Doppler, December, 2013, 0-39% bilateral  . 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 01/2013 MDT DTBA 1D1 [place] [employer] CRT-D, ser # [id number] H.  . Chronic back pain   . Chronic systolic CHF (congestive heart failure) ([place])    a. 02/2016 Echo: EF 35-40%, diff HK, triv AI, mildly dil Ao root 17mm, mild MR, sev dil LA, triv TR.  Marland Kitchen Chronotropic incompetence    IV pacing rate adjusted  . CKD (chronic kidney disease), stage III ([place])   . COPD (chronic obstructive pulmonary disease) ([place])   . Dilated aortic root ([place])   . Discolored skin   . Diverticulosis   . Drug therapy    Redness and swelling with Avelox infusion May 24, 2011  . Dvt femoral (deep venous thrombosis) (HCC) <2002  .  Dyspnea   . Eye abnormality    Ophthalmologist questions a clot in one of his eyes, May, 2012  . GERD (gastroesophageal reflux disease)   . Gout   . Gout    "on daily RX" (02/26/2018)  . Hyperlipidemia   . Hypertension   . Internal hemorrhoids   . Ischemic cardiomyopathy   . Left atrial thrombus    Remote past... cardioversions done since that time  . Mitral regurgitation    Mild echo  . Myocardial infarction ([place]) ~ 2002  . Nasal drainage    Chronic  . On home oxygen therapy     "2L prn; ~  4h/day" (03/18/2018)  . Overweight(278.02)   . Pericardial effusion   . Pleural effusion    Large loculated effusion on the left side November, 2011. This was tapped. It was exudative. Cytology revealed no cancer no proof of mesothelioma area pulmonary team felt that no further workup was needed  . Pleural thickening   . Pneumonia    "several times" (02/25/2018)  . 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 Presence of permanent cardiac pacemaker   . SOB (shortness of breath)    Large left effusion/ thoracentesis/hospitalization/November, 2011... exudated.. cytology negative.. Dr.Wert.. no proof of mesothelioma  . Spinal stenosis    Surgery Dr.Elsner  . Thrombophlebitis of superficial veins of upper extremities    Possible venous stenosis from defibrillator  . Type II diabetes mellitus ([place]) 2008  . Venous insufficiency    Toe discoloration chronic  . Ventral hernia    April, 2014, result of his abdominal surgery  . Warfarin anticoagulation   . Wide-complex tachycardia (HCC)     Assessment: 5 yoM on warfarin PTA for hx of PAF. INR on admit was 4.77, now up to 5.3 despite holding last night. Supratherapeutic INR likely due to a combination of hepatic congestion and poor PO intake. Last dose 12/15 prior to admission.  *PTA Warfarin Dose = 2.5mg  daily except 5mg  Tues   Goal of Therapy:  INR 2-3 Monitor platelets by anticoagulation protocol: Yes   Plan:  -Hold warfarin for now -Monitor daily INR, CBC, s/sx bleeding  Arrie Senate, PharmD, BCPS Clinical Pharmacist [phone] Please check AMION for all [place] numbers 03/06/2018

## 2018-03-06 NOTE — Progress Notes (Addendum)
Advanced Heart Failure Rounding Note  PCP-Cardiologist: No primary care provider on file.   Subjective:    Negative 84 cc. Weight down 1.5 lbs.   Coox 65.4% on milrinone 0.25 mcg/kg/min. CVP 8-9 on my personal check, reposition, and re-check.   He states he feels much better this am. Leg swelling much improved with compression hose and elevation.   Cr 3.86 -> 3.56 on milrinone and IV lasix.  K remains low at 2.7.  Objective:   Weight Range: 95.3 kg Body mass index is 28.49 kg/m.   Vital Signs:   Temp:  [97.9 F (36.6 C)-98.3 F (36.8 C)] 97.9 F (36.6 C) (12/17 0415) Pulse Rate:  [59-87] 78 (12/17 0330) Resp:  [13-26] 26 (12/17 0330) BP: (104-130)/(62-88) 117/81 (12/17 0330) SpO2:  [93 %-99 %] 93 % (12/17 0330) Weight:  [95.3 kg-96.1 kg] 95.3 kg (12/17 0456) Last BM Date: 03/04/2018  Weight change: Filed Weights   03/19/2018 1838 03/06/18 0456  Weight: 96.1 kg 95.3 kg    Intake/Output:   Intake/Output Summary (Last 24 hours) at 03/06/2018 0735 Last data filed at 03/06/2018 0507 Gross per 24 hour  Intake 885.66 ml  Output 970 ml  Net -84.34 ml      Physical Exam    General:  Elderly appearing. No resp difficulty HEENT: Normal. Hard of hearing. Neck: Supple. JVP at least 8-9 cm. Carotids 2+ bilat; no bruits. No lymphadenopathy or thyromegaly appreciated. Cor: PMI nondisplaced. Irregularly irregular. No rubs, gallops or murmurs. Lungs: CTAB, normal effort.  Abdomen: Soft, nontender, nondistended. No hepatosplenomegaly. No bruits or masses. Good bowel sounds. Extremities: No cyanosis, clubbing, or rash. 1-2+ edema to knees.  Neuro: Alert & orientedx3, cranial nerves grossly intact. moves all 4 extremities w/o difficulty. Affect pleasant   Telemetry   A fib 90-100s, personally reviewed.   EKG   No new tracings.    Labs    CBC Recent Labs    02/23/2018 1223 03/06/18 0405  WBC 6.6 6.0  HGB 13.1 12.3*  HCT 39.8 37.8*  MCV 89.6 89.6  PLT 217 203     Basic Metabolic Panel Recent Labs    03/04/2018 1223 03/06/18 0405  NA 133* 131*  K 2.7* 2.7*  CL 85* 87*  CO2 28 26  GLUCOSE 122* 161*  BUN 93* 93*  CREATININE 3.86* 3.56*  CALCIUM 8.8* 8.4*   Liver Function Tests No results for input(s): AST, ALT, ALKPHOS, BILITOT, PROT, ALBUMIN in the last 72 hours. No results for input(s): LIPASE, AMYLASE in the last 72 hours. Cardiac Enzymes No results for input(s): CKTOTAL, CKMB, CKMBINDEX, TROPONINI in the last 72 hours.  BNP: BNP (last 3 results) Recent Labs    12/29/17 1048 02/22/18 1215  BNP 1,274.1* 2,025.5*    ProBNP (last 3 results) No results for input(s): PROBNP in the last 8760 hours.   D-Dimer No results for input(s): DDIMER in the last 72 hours. Hemoglobin A1C No results for input(s): HGBA1C in the last 72 hours. Fasting Lipid Panel No results for input(s): CHOL, HDL, LDLCALC, TRIG, CHOLHDL, LDLDIRECT in the last 72 hours. Thyroid Function Tests No results for input(s): TSH, T4TOTAL, T3FREE, THYROIDAB in the last 72 hours.  Invalid input(s): FREET3  Other results:   Imaging    Dg Chest Port 1 View  Result Date: 03/06/2018 CLINICAL DATA:  PICC in place. EXAM: PORTABLE CHEST 1 VIEW COMPARISON:  Chest radiograph 02/23/2018 FINDINGS: Left upper extremity PICC with tip in the mid SVC. Right-sided pacemaker in place.  Patient is post median sternotomy. Spinal stimulator in place. Cardiomegaly is unchanged. Slight improvement in pulmonary edema. Pleural effusions are similar. No pneumothorax. IMPRESSION: 1. Left upper extremity PICC with tip in the mid SVC. 2. Slight improvement in pulmonary edema. Unchanged cardiomegaly and pleural effusions. Electronically Signed   By: Keith Rake M.D.   On: 03/06/2018 01:12   [place] Ekg Site Rite  Result Date: 02/26/2018 If Site Rite image not attached, placement could not be confirmed due to current cardiac rhythm.     Medications:     Scheduled Medications: .  allopurinol  200 mg Oral Daily  . atorvastatin  10 mg Oral Daily  . carvedilol  3.125 mg Oral BID WC  . cholecalciferol  1,000 Units Oral Daily  . DULoxetine  30 mg Oral Daily  . furosemide  80 mg Intravenous BID  . gabapentin  600 mg Oral TID  . hydrALAZINE  25 mg Oral Q8H  . insulin aspart  0-15 Units Subcutaneous TID WC  . insulin aspart  0-5 Units Subcutaneous QHS  . insulin aspart  3 Units Subcutaneous TID WC  . ipratropium  2 spray Each Nare Daily  . isosorbide mononitrate  30 mg Oral Daily  . mouth rinse  15 mL Mouth Rinse BID  . pantoprazole  40 mg Oral Daily  . sodium chloride flush  10-40 mL Intracatheter Q12H  . sodium chloride flush  3 mL Intravenous Q12H     Infusions: . sodium chloride 5 mL/hr at 03/06/18 0500  . milrinone 0.25 mcg/kg/min (03/06/18 0500)     PRN Medications:  sodium chloride, acetaminophen, ondansetron (ZOFRAN) IV, polyvinyl alcohol, sodium chloride flush, sodium chloride flush    Patient Profile   [name] is a 77 y.o. male  with history of CAD s/p CABG, permanent atrial fib, S/P AAA repair, ischemic cardiomyopathy/chronic systolic heart failure,  [employer] CRT-D system.   Admitted from HF clinic 03/18/2018 with volume overload and AKI.   Assessment/Plan   1.Acute on chronic systolic CHF: Ischemic cardiomyopathy. 02/2018 Echo, EF 35-40%, stable. [employer] CRT-D but LV lead turned off due to high threshold. 11/20109 Unable to place epicardial lead surgically. Patient was admitted earlier in 12/19 after [place] on 02/23/18 showed low output with CI 1.9 and elevated filling pressures. Creatinine was elevated to the 2.9 range.   He was started on milrinone and diuresed.  Milrinone weaned and he was sent home.  Admitted with worsening SOB and recurrent renal failure.  - Coox 65% on milrinone 0.25 mcg/kg/min - Continue Lasix 80 mg IV BID. CVP 8-9 cm. Rechecked, repositioned, and re-zeroed.  -Continue Coreg 3.125 mg BID for now for rate  control.  - Continue hydralazine 25 mg TID for now.  - Continue Imdur 30 daily.  - No ACEI/ARB/ARNI/spironolactone with AKI.  2. Atrial fibrillation: Permanent. -Continue warfarin.   - Continue Coreg for rate control as above. 3. CAD: s/p CABG - No s/s of ischemia.     - No ASA given warfarin use.  - Continue statin. Good lipids 4/19.  4.AKI onCKD Stage III: Progressive cardiorenal syndrome, creatinine up to 3.8.  Make sure to maintain BP (can back off on hydralazine if needed but per patient BP has been ok at home). BP has been 110-130s. - 3.8 -> 3.5. Continue to follow.   - Will ask nephrology to see.   - If renal function continues to worsen, he may be a dialysis candidate.  5. Hypokalemia - K 2.7 > 2.7 after  80 meq total yesterday ( took 40 at home) - Will supp and follow.   Medication concerns reviewed with patient and pharmacy team. Barriers identified: none at this time.   Length of Stay: 1  [name]  03/06/2018, 7:35 AM  Advanced Heart Failure Team Pager [phone] (M-F; 7a - 4p)  Please contact [facility] for night-coverage after hours (4p -7a ) and weekends on [email or web address]  Patient seen with PA, agree with the above note.    Milrinone was restarted at 0.25 yesterday.  He feels much better today, co-ox 65%.  Says he felt good as soon as milrinone was started.  He diuresed last night, weight down a little over 1 lb.  CVP only 8-9 this morning. HR 80s-90s afib. Creatinine trending down at 3.5.   On exam, no JVD.  Irregular S1S2.  1+ edema to knees.  Clear lungs.   Patient was admitted with progressive cardiorenal syndrome and acute/chronic systolic CHF.  He looks much better today back on milrinone, co-ox 65%.  I am concerned that he may be milrinone-dependent, which is surprising given EF 35-40% range.  Creatinine trending down, 3.8 => 3.5.  BP and HR stable.  - Continue current Coreg and hydralazine/Imdur.  - Lasix 80 mg IV bid today, may  convert to po tomorrow.  - Continue current milrinone for now, will follow renal function over the next several days to make sure it comes down.  He may end up needing home milrinone.   [name] 03/06/2018 8:06 AM

## 2018-03-06 NOTE — Plan of Care (Signed)
  Problem: Education: Goal: Knowledge of General Education information will improve Description Including pain rating scale, medication(s)/side effects and non-pharmacologic comfort measures Outcome: Completed/Met   Problem: Education: Goal: Knowledge of General Education information will improve Description Including pain rating scale, medication(s)/side effects and non-pharmacologic comfort measures Outcome: Completed/Met  Problem: Activity: Goal: Risk for activity intolerance will decrease Outcome: Progressing   Problem: Safety: Goal: Ability to remain free from injury will improve Outcome: Progressing   Problem: Activity: Goal: Capacity to carry out activities will improve Outcome: Progressing   Problem: Cardiac: Goal: Ability to achieve and maintain adequate cardiopulmonary perfusion will improve Outcome: Progressing

## 2018-03-06 NOTE — Progress Notes (Signed)
CRITICAL VALUE ALERT  Critical Value:  INR 5.3  Date & Time Notified:  03/06/18 at 0530  Provider Notified: Elson Areas  Orders Received/Actions taken: No new orders at this time

## 2018-03-06 NOTE — Progress Notes (Signed)
CRITICAL VALUE ALERT  Critical Value:  K 2.7  Date & Time Notied:  03/06/18 at 0548  Provider Notified: Elson Areas  Orders Received/Actions taken: Orders received.  See [facility]

## 2018-03-06 NOTE — Progress Notes (Signed)
[facility] Infusion Coordinator will follow patient with HF team to support Home Infusion Pharmacy services for home milrinone if needed at DC.   If patient discharges after hours, please call [phone].   Larry Sierras 03/06/2018, 12:28 PM

## 2018-03-07 DIAGNOSIS — I5021 Acute systolic (congestive) heart failure: Secondary | ICD-10-CM

## 2018-03-07 LAB — BASIC METABOLIC PANEL
Anion gap: 17 — ABNORMAL HIGH (ref 5–15)
BUN: 89 mg/dL — AB (ref 8–23)
CHLORIDE: 86 mmol/L — AB (ref 98–111)
CO2: 26 mmol/L (ref 22–32)
Calcium: 8.4 mg/dL — ABNORMAL LOW (ref 8.9–10.3)
Creatinine, Ser: 3.43 mg/dL — ABNORMAL HIGH (ref 0.61–1.24)
GFR calc Af Amer: 19 mL/min — ABNORMAL LOW (ref >60)
GFR calc non Af Amer: 16 mL/min — ABNORMAL LOW (ref >60)
Glucose, Bld: 161 mg/dL — ABNORMAL HIGH (ref 70–99)
Potassium: 3.4 mmol/L — ABNORMAL LOW (ref 3.5–5.1)
Sodium: 129 mmol/L — ABNORMAL LOW (ref 135–145)

## 2018-03-07 LAB — GLUCOSE, CAPILLARY
GLUCOSE-CAPILLARY: 168 mg/dL — AB (ref 70–99)
Glucose-Capillary: 125 mg/dL — ABNORMAL HIGH (ref 70–99)
Glucose-Capillary: 156 mg/dL — ABNORMAL HIGH (ref 70–99)
Glucose-Capillary: 132 mg/dL — ABNORMAL HIGH (ref 70–99)

## 2018-03-07 LAB — MAGNESIUM: MAGNESIUM: 2.5 mg/dL — AB (ref 1.7–2.4)

## 2018-03-07 LAB — CBC
HCT: 35.7 % — ABNORMAL LOW (ref 39.0–52.0)
Hemoglobin: 12.3 g/dL — ABNORMAL LOW (ref 13.0–17.0)
MCH: 30.6 pg (ref 26.0–34.0)
MCHC: 34.5 g/dL (ref 30.0–36.0)
MCV: 88.8 fL (ref 80.0–100.0)
Platelets: 212 10*3/uL (ref 150–400)
RBC: 4.02 MIL/uL — ABNORMAL LOW (ref 4.22–5.81)
RDW: 15.2 % (ref 11.5–15.5)
WBC: 6.2 10*3/uL (ref 4.0–10.5)
nRBC: 0 % (ref 0.0–0.2)

## 2018-03-07 LAB — PROTIME-INR
INR: 3.95
PROTHROMBIN TIME: 38 s — AB (ref 11.4–15.2)

## 2018-03-07 LAB — COOXEMETRY PANEL
Carboxyhemoglobin: 1.7 % — ABNORMAL HIGH (ref 0.5–1.5)
Methemoglobin: 1.2 % (ref 0.0–1.5)
O2 Saturation: 66 %
Total hemoglobin: 12.6 g/dL (ref 12.0–16.0)

## 2018-03-07 MED ORDER — POTASSIUM CHLORIDE CRYS ER 20 MEQ PO TBCR: 40.0000 meq | EXTENDED_RELEASE_TABLET | Freq: Once | ORAL | Status: DC

## 2018-03-07 MED ORDER — TRAZODONE HCL 50 MG PO TABS
50.0000 mg | ORAL_TABLET | Freq: Every day | ORAL | Status: DC
  Administered 2018-03-07 – 2018-03-10 (×4): 50 mg via ORAL
  Filled 2018-03-07 (×4): qty 1

## 2018-03-07 MED ORDER — TORSEMIDE 20 MG PO TABS
80.0000 mg | ORAL_TABLET | Freq: Two times a day (BID) | ORAL | Status: DC
  Administered 2018-03-07: 80 mg via ORAL
  Filled 2018-03-07: qty 4

## 2018-03-07 MED ORDER — FUROSEMIDE 10 MG/ML IJ SOLN
80.0000 mg | Freq: Once | INTRAMUSCULAR | Status: AC
  Administered 2018-03-07: 80 mg via INTRAVENOUS

## 2018-03-07 MED ORDER — POTASSIUM CHLORIDE CRYS ER 20 MEQ PO TBCR
40.0000 meq | EXTENDED_RELEASE_TABLET | ORAL | Status: AC
  Administered 2018-03-07 (×2): 40 meq via ORAL
  Filled 2018-03-07 (×2): qty 2

## 2018-03-07 MED ORDER — METOLAZONE 2.5 MG PO TABS
2.5000 mg | ORAL_TABLET | Freq: Once | ORAL | Status: AC
  Administered 2018-03-07: 2.5 mg via ORAL
  Filled 2018-03-07: qty 1

## 2018-03-07 MED ORDER — FUROSEMIDE 10 MG/ML IJ SOLN
INTRAMUSCULAR | Status: AC
  Filled 2018-03-07: qty 8

## 2018-03-07 MED ORDER — AMIODARONE HCL 200 MG PO TABS
200.0000 mg | ORAL_TABLET | Freq: Two times a day (BID) | ORAL | Status: DC
  Administered 2018-03-07 – 2018-03-08 (×4): 200 mg via ORAL
  Filled 2018-03-07 (×4): qty 1

## 2018-03-07 NOTE — Care Management Note (Signed)
Case Management Note  Patient Details  Name: [name] MRN: [id number] Date of Birth: 1940-11-17  Subjective/Objective:  Pt admitted with CHF- recently discharged after milrinone infusion                  Action/Plan:  PTA independent from home with wife.  Pt recently discharged home on oxygen.  Pt may need milrinone at discharge, DME agency choice given and [facility] chosen - [facility] following   Expected Discharge Date:                  Expected Discharge Plan:  [place]  In-House Referral:     Discharge planning Services     Post Acute Care Choice:  Durable Medical Equipment Choice offered to:     DME Arranged:    DME Agency:     HH Arranged:    [place] Agency:     Status of Service:  In process, will continue to follow  If discussed at Long Length of Stay Meetings, dates discussed:    Additional Comments:  Maryclare Labrador, RN 03/07/2018, 3:39 PM

## 2018-03-07 NOTE — Progress Notes (Signed)
[place] for warfarin Indication: atrial fibrillation  Allergies  Allergen Reactions  . Avelox [Moxifloxacin Hcl In Nacl] Swelling, Rash and Other (See Comments)    Patient became hypotensive after infusion started Because of a history of documented adverse serious drug reaction;Medi Alert bracelet  is recommended  . Other Rash and Swelling    Patient became hypotensive after infusion started Because of a history of documented adverse serious drug reaction;Medi Alert braceletis recommended  . Penicillins Anaphylaxis, Swelling and Other (See Comments)    Because of a history of documented adverse serious drug reaction;Medi Alert bracelet  is recommended Has patient had a PCN reaction causing immediate rash, facial/tongue/throat swelling, SOB or lightheadedness with hypotension: Yes Has patient had a PCN reaction causing severe rash involving mucus membranes or skin necrosis: No Has patient had a PCN reaction that required hospitalization: Unknown Has patient had a PCN reaction occurring within the last 10 years: No  . Tape Other (See Comments)    "skin is thin; plastic tape rips skin off when removed; please use paper tape"/spouse    Patient Measurements: Height: 6' (182.9 cm) Weight: 212 lb 4.9 oz (96.3 kg) IBW/kg (Calculated) : 77.6  Vital Signs: Temp: 97.7 F (36.5 C) (12/18 0709) Temp Source: Oral (12/18 0709) BP: 124/80 (12/18 0709) Pulse Rate: 62 (12/18 0709)  Labs: Recent Labs    02/23/2018 1223 03/06/18 0405 03/06/18 1200 03/07/18 0315  HGB 13.1 12.3*  --  12.3*  HCT 39.8 37.8*  --  35.7*  PLT 217 203  --  212  LABPROT 44.0* 47.7*  --  38.0*  INR 4.77* 5.30*  --  3.95  CREATININE 3.86* 3.56* 3.45* 3.43*    Estimated Creatinine Clearance: 22.1 mL/min (A) (by C-G formula based on SCr of 3.43 mg/dL (H)).   Medical History: Past Medical History:  Diagnosis Date  . AAA (abdominal aortic aneurysm) ([place])    Surgical repair  11/2002.  . Adenomatous colon polyp   . AICD (automatic cardioverter/defibrillator) present   . Alcohol ingestion of more than four drinks per week    Excess beer  not a dependency problem  . Aortic valve sclerosis   . Arthritis    "all over" (03/03/2018)  . Atrial fibrillation ([place])    Previous long-term amiodarone therapy with multiple cardioversions / amiodarone stopped September, 2009  . Atrial flutter [facility])    Started November, 2010, Left-sided and cannot ablate  . Bony abnormality    Patient's manubrium is slightly displaced to the right  . CAD (coronary artery disease)    a. s/p CABG 2004;  b. 02/2016 Cath: LM nl, LAD 70p, 170m, D1 50, D2 50, RI 50ost, LCX 81m, OM2 100, OM3 100, RCA 70p, 100d, VG->OM3 ok, LIMA->LAD 60ost, VG->RPDA  ok.  Marland Kitchen CAD (coronary artery disease)    Doppler, December, 2013, 0-39% bilateral  . 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 01/2013 MDT DTBA 1D1 [place] [employer] CRT-D, ser # [id number] H.  . Chronic back pain   . Chronic systolic CHF (congestive heart failure) ([place])    a. 02/2016 Echo: EF 35-40%, diff HK, triv AI, mildly dil Ao root 56mm, mild MR, sev dil LA, triv TR.  Marland Kitchen Chronotropic incompetence    IV pacing rate adjusted  . CKD (chronic kidney disease), stage III ([place])   . COPD (chronic obstructive pulmonary disease) ([place])   . Dilated aortic root ([place])   . Discolored skin   . Diverticulosis   .  Drug therapy    Redness and swelling with Avelox infusion May 24, 2011  . Dvt femoral (deep venous thrombosis) (HCC) <2002  . Dyspnea   . Eye abnormality    Ophthalmologist questions a clot in one of his eyes, May, 2012  . GERD (gastroesophageal reflux disease)   . Gout   . Gout    "on daily RX" (03/12/2018)  . Hyperlipidemia   . Hypertension   . Internal hemorrhoids   . Ischemic cardiomyopathy   . Left atrial thrombus    Remote past... cardioversions done since that time  . Mitral regurgitation    Mild echo  . Myocardial  infarction ([place]) ~ 2002  . Nasal drainage    Chronic  . On home oxygen therapy    "2L prn; ~  4h/day" (03/20/2018)  . Overweight(278.02)   . Pericardial effusion   . Pleural effusion    Large loculated effusion on the left side November, 2011. This was tapped. It was exudative. Cytology revealed no cancer no proof of mesothelioma area pulmonary team felt that no further workup was needed  . Pleural thickening   . Pneumonia    "several times" (03/12/2018)  . 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 Presence of permanent cardiac pacemaker   . SOB (shortness of breath)    Large left effusion/ thoracentesis/hospitalization/November, 2011... exudated.. cytology negative.. Dr.Wert.. no proof of mesothelioma  . Spinal stenosis    Surgery Dr.Elsner  . Thrombophlebitis of superficial veins of upper extremities    Possible venous stenosis from defibrillator  . Type II diabetes mellitus ([place]) 2008  . Venous insufficiency    Toe discoloration chronic  . Ventral hernia    April, 2014, result of his abdominal surgery  . Warfarin anticoagulation   . Wide-complex tachycardia (HCC)     Assessment: 45 yoM on warfarin PTA for hx of PAF. INR on admit was 4.77, initially up above 5 but now down to 3.95 after holding (last dose 12/15 at home). CBC stable.  *PTA Warfarin Dose = 2.5mg  daily except 5mg  Tues   Goal of Therapy:  INR 2-3 Monitor platelets by anticoagulation protocol: Yes   Plan:  -Hold warfarin again tonight -Monitor daily INR, CBC, s/sx bleeding  Arrie Senate, PharmD, BCPS Clinical Pharmacist [phone] Please check AMION for all [place] numbers 03/07/2018

## 2018-03-07 NOTE — Progress Notes (Addendum)
Advanced Heart Failure Rounding Note  PCP-Cardiologist: No primary care provider on file.   Subjective:    I/O even. Weight shows up 2 lbs. ? Accuracy.  Coox 66% on milrinone 0.25 mcg/kg/min. CVP 5-6 currently.   Feeling about the same today. Remains swollen in his feet. Feels like he's walking on "sponges". Denies lightheadedness or dizziness walking up to bathroom.   Cr 3.86 -> 3.56 -> 3.43 on milrinone and IV lasix.  K 2.7 -> 3.4.  Objective:   Weight Range: 96.3 kg Body mass index is 28.79 kg/m.   Vital Signs:   Temp:  [97.4 F (36.3 C)-98.1 F (36.7 C)] 97.7 F (36.5 C) (12/18 0709) Pulse Rate:  [50-120] 62 (12/18 0709) Resp:  [15-38] 21 (12/18 0709) BP: (106-132)/(61-81) 124/80 (12/18 0709) SpO2:  [91 %-97 %] 91 % (12/18 0709) Weight:  [96.3 kg] 96.3 kg (12/18 0202) Last BM Date: 03/18/2018  Weight change: Filed Weights   02/21/2018 1838 03/06/18 0456 03/07/18 0202  Weight: 96.1 kg 95.3 kg 96.3 kg   Intake/Output:   Intake/Output Summary (Last 24 hours) at 03/07/2018 0841 Last data filed at 03/07/2018 0400 Gross per 24 hour  Intake 736.91 ml  Output 970 ml  Net -233.09 ml    Physical Exam    General: Elderly appearing.  HEENT: Normal. HOH Neck: Supple. JVP at least 8-9 cm. Carotids 2+ bilat; no bruits. No thyromegaly or nodule noted. Cor: PMI nondisplaced. RRR, No M/G/R noted Lungs: CTAB, normal effort. Abdomen: Soft, non-tender, non-distended, no HSM. No bruits or masses. +BS  Extremities: No cyanosis, clubbing, or rash. 1-2+ edema to knees with compression hose.  Neuro: Alert & orientedx3, cranial nerves grossly intact. moves all 4 extremities w/o difficulty. Affect pleasant   Telemetry   Afib 90s with very frequent PVCs and NSVT, personally reviewed.   EKG   No new tracings.    Labs    CBC Recent Labs    03/06/18 0405 03/07/18 0315  WBC 6.0 6.2  HGB 12.3* 12.3*  HCT 37.8* 35.7*  MCV 89.6 88.8  PLT 203 947   Basic Metabolic  Panel Recent Labs    03/06/18 1200 03/07/18 0315  NA 130* 129*  K 3.1* 3.4*  CL 85* 86*  CO2 27 26  GLUCOSE 193* 161*  BUN 87* 89*  CREATININE 3.45* 3.43*  CALCIUM 8.4* 8.4*  MG  --  2.5*   Liver Function Tests No results for input(s): AST, ALT, ALKPHOS, BILITOT, PROT, ALBUMIN in the last 72 hours. No results for input(s): LIPASE, AMYLASE in the last 72 hours. Cardiac Enzymes No results for input(s): CKTOTAL, CKMB, CKMBINDEX, TROPONINI in the last 72 hours.  BNP: BNP (last 3 results) Recent Labs    12/29/17 1048 02/22/18 1215  BNP 1,274.1* 2,025.5*    ProBNP (last 3 results) No results for input(s): PROBNP in the last 8760 hours.   D-Dimer No results for input(s): DDIMER in the last 72 hours. Hemoglobin A1C No results for input(s): HGBA1C in the last 72 hours. Fasting Lipid Panel No results for input(s): CHOL, HDL, LDLCALC, TRIG, CHOLHDL, LDLDIRECT in the last 72 hours. Thyroid Function Tests No results for input(s): TSH, T4TOTAL, T3FREE, THYROIDAB in the last 72 hours.  Invalid input(s): FREET3  Other results:   Imaging    No results found.   Medications:     Scheduled Medications: . allopurinol  200 mg Oral Daily  . atorvastatin  10 mg Oral Daily  . carvedilol  3.125 mg Oral  BID WC  . cholecalciferol  1,000 Units Oral Daily  . DULoxetine  30 mg Oral Daily  . furosemide  80 mg Intravenous BID  . gabapentin  600 mg Oral TID  . hydrALAZINE  25 mg Oral Q8H  . insulin aspart  0-15 Units Subcutaneous TID WC  . insulin aspart  0-5 Units Subcutaneous QHS  . insulin aspart  3 Units Subcutaneous TID WC  . ipratropium  2 spray Each Nare Daily  . isosorbide mononitrate  30 mg Oral Daily  . mouth rinse  15 mL Mouth Rinse BID  . pantoprazole  40 mg Oral Daily  . sodium chloride flush  10-40 mL Intracatheter Q12H  . sodium chloride flush  3 mL Intravenous Q12H  . Warfarin - Pharmacist Dosing Inpatient   Does not apply q1800    Infusions: . sodium  chloride 5 mL/hr at 03/07/18 0400  . milrinone 0.25 mcg/kg/min (03/07/18 0400)    PRN Medications: sodium chloride, acetaminophen, ondansetron (ZOFRAN) IV, polyvinyl alcohol, sodium chloride flush, sodium chloride flush    Patient Profile   [name] is a 77 y.o. male  with history of CAD s/p CABG, permanent atrial fib, S/P AAA repair, ischemic cardiomyopathy/chronic systolic heart failure,  [employer] CRT-D system.   Admitted from HF clinic 03/15/2018 with volume overload and AKI.   Assessment/Plan   1.Acute on chronic systolic CHF: Ischemic cardiomyopathy. 02/2018 Echo, EF 35-40%, stable. [employer] CRT-D but LV lead turned off due to high threshold. 11/20109 Unable to place epicardial lead surgically. Patient was admitted earlier in 12/19 after [name] on 02/23/18 showed low output with CI 1.9 and elevated filling pressures. Creatinine was elevated to the 2.9 range.   He was started on milrinone and diuresed.  Milrinone weaned and he was sent home.  Admitted with worsening SOB and recurrent renal failure.  - Coox 66% on milrinone 0.25 mcg/kg/min.  - Switch back to torsemide 80 mg BID. CVP 5-6 cm  -Continue Coreg 3.125 mg BID for now for rate control.  - Continue hydralazine 25 mg TID for now.  - Continue Imdur 30 daily.  - No ACEI/ARB/ARNI/spironolactone with AKI.  2. Atrial fibrillation: Permanent. -Continue warfarin.   - Continue Coreg for rate control as above. 3. CAD: s/p CABG - No s/s of ischemia.    - No ASA given warfarin use.  - Continue statin. Good lipids 4/19.  4.AKI onCKD Stage III: Progressive cardiorenal syndrome, creatinine up to 3.8.  Make sure to maintain BP (can back off on hydralazine if needed but per patient BP has been ok at home). BP has been 110-130s. - 3.8 -> 3.5 -> 3.43. Continue to follow.   - If renal function continues to worsen, he may be a dialysis candidate. Will hold off on renal referral for now with gentle down trend.  5.  Hypokalemia - K 3.4 after aggressive supp. Continue to supp and follow.  6. NSVT/PVCs - Very frequent. Will start on amiodarone 200 mg BID and follow.   [facility] following for potential home milrinone.   Medication concerns reviewed with patient and pharmacy team. Barriers identified: none at this time.   Length of Stay: 2  Annamaria Helling  03/07/2018, 8:41 AM  Advanced Heart Failure Team Pager [phone] (M-F; 7a - 4p)  Please contact [facility] for night-coverage after hours (4p -7a ) and weekends on [email or web address]  Patient seen with PA, agree with the above note.   Milrinone was restarted at 0.25.  He feels  much better on milrinone, co-ox 66%.  Says he felt good as soon as milrinone was started.  He was switched to po torsemide but on my measurement, CVP is 13. HR 90s-100s afib with frequent PVCs. Creatinine trending down slightly at 3.4.    On exam, JVP 10-12 cm.  Irregular S1S2.  1+ edema to knees, 2+ edema of feet.  Clear lungs.   Patient was admitted with progressive cardiorenal syndrome and acute/chronic systolic CHF.  He has felt better on milrinone, co-ox 66%.  I am concerned that he may be milrinone-dependent, which is surprising given EF 35-40% range.  Creatinine trending down, 3.8 => 3.5 => 3.4.  BP and HR stable.  - Continue current Coreg and hydralazine/Imdur.  - Got torsemide this morning but with high CVP and uncomfortable foot swelling, would continue Lasix 80 mg IV bid (give dose this afternoon).  Give 1 dose metolazone 2.5 x 1 today.  - Continue current milrinone for now, will follow renal function over the next several days to make sure it comes down.  He may end up needing home milrinone. In that case, he will need tunneled PICC (INR will need to trend down for this).   With frequent PVCs and short NSVT on milrinone, will add amiodarone 200 mg bid.  This will help with rate control also.   Loralie Champagne 03/07/2018 11:00 AM

## 2018-03-08 LAB — COOXEMETRY PANEL
Carboxyhemoglobin: 1.7 % — ABNORMAL HIGH (ref 0.5–1.5)
Methemoglobin: 1.8 % — ABNORMAL HIGH (ref 0.0–1.5)
O2 Saturation: 86.2 %
Total hemoglobin: 11.7 g/dL — ABNORMAL LOW (ref 12.0–16.0)

## 2018-03-08 LAB — GLUCOSE, CAPILLARY
Glucose-Capillary: 181 mg/dL — ABNORMAL HIGH (ref 70–99)
Glucose-Capillary: 160 mg/dL — ABNORMAL HIGH (ref 70–99)
Glucose-Capillary: 123 mg/dL — ABNORMAL HIGH (ref 70–99)
Glucose-Capillary: 106 mg/dL — ABNORMAL HIGH (ref 70–99)

## 2018-03-08 LAB — CBC
HEMATOCRIT: 35.9 % — AB (ref 39.0–52.0)
Hemoglobin: 12 g/dL — ABNORMAL LOW (ref 13.0–17.0)
MCH: 29.6 pg (ref 26.0–34.0)
MCHC: 33.4 g/dL (ref 30.0–36.0)
MCV: 88.6 fL (ref 80.0–100.0)
Platelets: 196 10*3/uL (ref 150–400)
RBC: 4.05 MIL/uL — ABNORMAL LOW (ref 4.22–5.81)
RDW: 15.3 % (ref 11.5–15.5)
WBC: 6.3 10*3/uL (ref 4.0–10.5)
nRBC: 0 % (ref 0.0–0.2)

## 2018-03-08 LAB — BASIC METABOLIC PANEL
Anion gap: 17 — ABNORMAL HIGH (ref 5–15)
BUN: 87 mg/dL — ABNORMAL HIGH (ref 8–23)
CO2: 26 mmol/L (ref 22–32)
Calcium: 8.5 mg/dL — ABNORMAL LOW (ref 8.9–10.3)
Chloride: 86 mmol/L — ABNORMAL LOW (ref 98–111)
Creatinine, Ser: 3.35 mg/dL — ABNORMAL HIGH (ref 0.61–1.24)
GFR calc Af Amer: 20 mL/min — ABNORMAL LOW (ref >60)
GFR, EST NON AFRICAN AMERICAN: 17 mL/min — AB (ref >60)
Glucose, Bld: 185 mg/dL — ABNORMAL HIGH (ref 70–99)
Potassium: 3.3 mmol/L — ABNORMAL LOW (ref 3.5–5.1)
Sodium: 129 mmol/L — ABNORMAL LOW (ref 135–145)

## 2018-03-08 LAB — HEPATIC FUNCTION PANEL
ALT: 16 U/L (ref 0–44)
AST: 14 U/L — ABNORMAL LOW (ref 15–41)
Albumin: 3.4 g/dL — ABNORMAL LOW (ref 3.5–5.0)
Alkaline Phosphatase: 63 U/L (ref 38–126)
BILIRUBIN TOTAL: 1.7 mg/dL — AB (ref 0.3–1.2)
Bilirubin, Direct: 0.5 mg/dL — ABNORMAL HIGH (ref 0.0–0.2)
Indirect Bilirubin: 1.2 mg/dL — ABNORMAL HIGH (ref 0.3–0.9)
Total Protein: 5.9 g/dL — ABNORMAL LOW (ref 6.5–8.1)

## 2018-03-08 LAB — PROTIME-INR
INR: 3.72
Prothrombin Time: 36.3 seconds — ABNORMAL HIGH (ref 11.4–15.2)

## 2018-03-08 MED ORDER — FUROSEMIDE 10 MG/ML IJ SOLN
120.0000 mg | Freq: Two times a day (BID) | INTRAVENOUS | Status: DC
  Administered 2018-03-08 – 2018-03-09 (×3): 120 mg via INTRAVENOUS
  Filled 2018-03-08: qty 10
  Filled 2018-03-08: qty 12
  Filled 2018-03-08 (×2): qty 10

## 2018-03-08 MED ORDER — CARVEDILOL 6.25 MG PO TABS
6.2500 mg | ORAL_TABLET | Freq: Two times a day (BID) | ORAL | Status: DC
  Administered 2018-03-08 – 2018-03-10 (×3): 6.25 mg via ORAL
  Filled 2018-03-08 (×4): qty 1

## 2018-03-08 MED ORDER — METOLAZONE 5 MG PO TABS
5.0000 mg | ORAL_TABLET | Freq: Once | ORAL | Status: AC
  Administered 2018-03-08: 5 mg via ORAL
  Filled 2018-03-08: qty 1

## 2018-03-08 MED ORDER — POTASSIUM CHLORIDE CRYS ER 20 MEQ PO TBCR
40.0000 meq | EXTENDED_RELEASE_TABLET | Freq: Once | ORAL | Status: AC
  Administered 2018-03-08: 40 meq via ORAL
  Filled 2018-03-08: qty 2

## 2018-03-08 MED ORDER — POTASSIUM CHLORIDE CRYS ER 20 MEQ PO TBCR
40.0000 meq | EXTENDED_RELEASE_TABLET | Freq: Once | ORAL | Status: DC
  Filled 2018-03-08: qty 2

## 2018-03-08 NOTE — Progress Notes (Signed)
CARDIAC REHAB PHASE I   Offered to walk with pt, pt states he is wore out and weak today. Pts wife states concerns on lack of urine. Pt states he feels like he has to go, but "it wont come". Provided support and encouragement to pt. Will follow-up tomorrow to help pt with ambulation.  [place], RN BSN 03/08/2018 2:22 PM

## 2018-03-08 NOTE — Care Management Important Message (Signed)
Important Message  Patient Details  Name: [name] MRN: [id number] Date of Birth: January 09, 1941   Medicare Important Message Given:  Yes    [name] 03/08/2018, 11:29 AM

## 2018-03-08 NOTE — Progress Notes (Signed)
[place] for warfarin Indication: atrial fibrillation  Allergies  Allergen Reactions  . Avelox [Moxifloxacin Hcl In Nacl] Swelling, Rash and Other (See Comments)    Patient became hypotensive after infusion started Because of a history of documented adverse serious drug reaction;Medi Alert bracelet  is recommended  . Other Rash and Swelling    Patient became hypotensive after infusion started Because of a history of documented adverse serious drug reaction;Medi Alert braceletis recommended  . Penicillins Anaphylaxis, Swelling and Other (See Comments)    Because of a history of documented adverse serious drug reaction;Medi Alert bracelet  is recommended Has patient had a PCN reaction causing immediate rash, facial/tongue/throat swelling, SOB or lightheadedness with hypotension: Yes Has patient had a PCN reaction causing severe rash involving mucus membranes or skin necrosis: No Has patient had a PCN reaction that required hospitalization: Unknown Has patient had a PCN reaction occurring within the last 10 years: No  . Tape Other (See Comments)    "skin is thin; plastic tape rips skin off when removed; please use paper tape"/spouse    Patient Measurements: Height: 6' (182.9 cm) Weight: 214 lb 15.2 oz (97.5 kg) IBW/kg (Calculated) : 77.6  Vital Signs: Temp: 97.2 F (36.2 C) (12/19 0357) Temp Source: Oral (12/19 0014) BP: 124/88 (12/19 0539) Pulse Rate: 106 (12/19 0400)  Labs: Recent Labs    03/06/18 0405 03/06/18 1200 03/07/18 0315 03/08/18 0331  HGB 12.3*  --  12.3* 12.0*  HCT 37.8*  --  35.7* 35.9*  PLT 203  --  212 196  LABPROT 47.7*  --  38.0* 36.3*  INR 5.30*  --  3.95 3.72  CREATININE 3.56* 3.45* 3.43* 3.35*    Estimated Creatinine Clearance: 22.7 mL/min (A) (by C-G formula based on SCr of 3.35 mg/dL (H)).   Medical History: Past Medical History:  Diagnosis Date  . AAA (abdominal aortic aneurysm) ([place])    Surgical  repair 11/2002.  . Adenomatous colon polyp   . AICD (automatic cardioverter/defibrillator) present   . Alcohol ingestion of more than four drinks per week    Excess beer  not a dependency problem  . Aortic valve sclerosis   . Arthritis    "all over" (03/19/2018)  . Atrial fibrillation ([place])    Previous long-term amiodarone therapy with multiple cardioversions / amiodarone stopped September, 2009  . Atrial flutter [facility])    Started November, 2010, Left-sided and cannot ablate  . Bony abnormality    Patient's manubrium is slightly displaced to the right  . CAD (coronary artery disease)    a. s/p CABG 2004;  b. 02/2016 Cath: LM nl, LAD 70p, 13m, D1 50, D2 50, RI 50ost, LCX 1m, OM2 100, OM3 100, RCA 70p, 100d, VG->OM3 ok, LIMA->LAD 60ost, VG->RPDA  ok.  Marland Kitchen CAD (coronary artery disease)    Doppler, December, 2013, 0-39% bilateral  . 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 01/2013 MDT DTBA 1D1 [place] CRT-D, ser # [id number].  . Chronic back pain   . Chronic systolic CHF (congestive heart failure) ([place])    a. 02/2016 Echo: EF 35-40%, diff HK, triv AI, mildly dil Ao root 9mm, mild MR, sev dil LA, triv TR.  Marland Kitchen Chronotropic incompetence    IV pacing rate adjusted  . CKD (chronic kidney disease), stage III ([place])   . COPD (chronic obstructive pulmonary disease) ([place])   . Dilated aortic root ([place])   . Discolored skin   . Diverticulosis   .  Drug therapy    Redness and swelling with Avelox infusion May 24, 2011  . Dvt femoral (deep venous thrombosis) (H[facility]) <2002  . Dyspnea   . Eye abnormality    Ophthalmologist questions a clot in one of his eyes, May, 2012  . GERD (gastroesophageal reflux disease)   . Gout   . Gout    "on daily RX" (03/10/2018)  . Hyperlipidemia   . Hypertension   . Internal hemorrhoids   . Ischemic cardiomyopathy   . Left atrial thrombus    Remote past... cardioversions done since that time  . Mitral regurgitation    Mild echo  .  Myocardial infarction ([place]) ~ 2002  . Nasal drainage    Chronic  . On home oxygen therapy    "2L prn; ~  4h/day" (02/21/2018)  . Overweight(278.02)   . Pericardial effusion   . Pleural effusion    Large loculated effusion on the left side November, 2011. This was tapped. It was exudative. Cytology revealed no cancer no proof of mesothelioma area pulmonary team felt that no further workup was needed  . Pleural thickening   . Pneumonia    "several times" (03/18/2018)  . Polycythemia vera ([place]) 07/28/2014  . Presence of permanent cardiac pacemaker   . SOB (shortness of breath)    Large left effusion/ thoracentesis/hospitalization/November, 2011... exudated.. cytology negative.. Dr.Wert.. no proof of mesothelioma  . Spinal stenosis    Surgery Dr.Elsner  . Thrombophlebitis of superficial veins of upper extremities    Possible venous stenosis from defibrillator  . Type II diabetes mellitus ([place]) 2008  . Venous insufficiency    Toe discoloration chronic  . Ventral hernia    April, 2014, result of his abdominal surgery  . Warfarin anticoagulation   . Wide-complex tachycardia (HCC)     Assessment: 4 yoM on warfarin PTA for hx of PAF. INR on admit was 4.77, initially up above 5 but now trending down slowly to 3.72 after holding (last dose 12/15 at home). CBC stable, LFTs wnl.  *PTA Warfarin Dose = 2.5mg  daily except 5mg  Tues  Goal of Therapy:  INR 2-3 Monitor platelets by anticoagulation protocol: Yes   Plan:  -Hold warfarin again tonight -Monitor daily INR, CBC, s/sx bleeding  Arrie Senate, PharmD, BCPS Clinical Pharmacist [phone] Please check AMION for all [place] numbers 03/08/2018

## 2018-03-08 NOTE — Evaluation (Signed)
Physical Therapy Evaluation Patient Details Name: RAYNARD MAPPS MRN: [id number] DOB: October 08, 1940 Today's Date: 03/08/2018   History of Present Illness  Pt is a 77 y.o. M with significant PMH of back surgery, CAD s/p CABG, a fib, s/p AAA repair, chronic systolic heart failure who was admitted with volume overload and AKI.  Clinical Impression  Pt admitted with above diagnosis. Pt currently with functional limitations due to the deficits listed below (see PT Problem List). Prior to admission, pt independent with ADL's and ambulating with cane. On PT evaluation, pt presenting with decreased functional mobility secondary to debility, decreased endurance, and balance impairments. Ambulating 100 feet with walker and min guard assist, fatigues easily. HR 95-120s. May need post acute rehab services if does not progress. Pt will benefit from skilled PT to increase their independence and safety with mobility to allow discharge to the venue listed below.       Follow Up Recommendations Home health PT;Supervision for mobility/OOB    Equipment Recommendations  None recommended by PT    Recommendations for Other Services OT consult     Precautions / Restrictions Precautions Precautions: Fall Restrictions Weight Bearing Restrictions: No      Mobility  Bed Mobility Overal bed mobility: Modified Independent             General bed mobility comments: HOB elevated  Transfers Overall transfer level: Needs assistance Equipment used: Rolling walker (2 wheeled) Transfers: Sit to/from Stand Sit to Stand: Min assist         General transfer comment: min assist to boost up to stand from lowered bed surface  Ambulation/Gait Ambulation/Gait assistance: Min guard Gait Distance (Feet): 100 Feet Assistive device: Rolling walker (2 wheeled) Gait Pattern/deviations: Step-through pattern;Decreased stride length;Trunk flexed Gait velocity: decreased Gait velocity interpretation: <1.8 ft/sec,  indicate of risk for recurrent falls General Gait Details: Cues for walker proximity, pt with trunk flexion throughout.   Stairs            Wheelchair Mobility    Modified Rankin (Stroke Patients Only)       Balance Overall balance assessment: Needs assistance Sitting-balance support: Feet supported Sitting balance-Leahy Scale: Good     Standing balance support: Bilateral upper extremity supported Standing balance-Leahy Scale: Poor                               Pertinent Vitals/Pain Pain Assessment: Faces Faces Pain Scale: Hurts little more Pain Location: pt has chronic back pain, reports increased back pain towards end of ambulation Pain Descriptors / Indicators: Aching Pain Intervention(s): Monitored during session    Home Living Family/patient expects to be discharged to:: Private residence Living Arrangements: Spouse/significant other Available Help at Discharge: Family Type of Home: House Home Access: Stairs to enter Entrance Stairs-Rails: Right Entrance Stairs-Number of Steps: 2 Home Layout: Able to live on main level with bedroom/bathroom Home Equipment: Cane - single point;Walker - 2 wheels      Prior Function Level of Independence: Independent with assistive device(s)         Comments: uses cane for mobility, drives, community ambulatory     Hand Dominance        Extremity/Trunk Assessment   Upper Extremity Assessment Upper Extremity Assessment: Overall WFL for tasks assessed    Lower Extremity Assessment Lower Extremity Assessment: Generalized weakness    Cervical / Trunk Assessment Cervical / Trunk Assessment: Kyphotic  Communication   Communication: HOH  Cognition Arousal/Alertness:  Awake/alert Behavior During Therapy: WFL for tasks assessed/performed Overall Cognitive Status: Within Functional Limits for tasks assessed                                        General Comments      Exercises      Assessment/Plan    PT Assessment Patient needs continued PT services  PT Problem List Decreased strength;Decreased activity tolerance;Decreased balance;Decreased mobility;Cardiopulmonary status limiting activity       PT Treatment Interventions DME instruction;Gait training;Stair training;Functional mobility training;Therapeutic activities;Therapeutic exercise;Balance training;Patient/family education    PT Goals (Current goals can be found in the Care Plan section)  Acute Rehab PT Goals Patient Stated Goal: "not be so tired." PT Goal Formulation: With patient Time For Goal Achievement: 03/22/18 Potential to Achieve Goals: Good    Frequency Min 3X/week   Barriers to discharge        Co-evaluation               AM-PAC PT "6 Clicks" Mobility  Outcome Measure Help needed turning from your back to your side while in a flat bed without using bedrails?: None Help needed moving from lying on your back to sitting on the side of a flat bed without using bedrails?: None Help needed moving to and from a bed to a chair (including a wheelchair)?: A Little Help needed standing up from a chair using your arms (e.g., wheelchair or bedside chair)?: A Little Help needed to walk in hospital room?: A Little Help needed climbing 3-5 steps with a railing? : A Lot 6 Click Score: 19    End of Session Equipment Utilized During Treatment: Gait belt Activity Tolerance: Patient tolerated treatment well Patient left: in bed;with call bell/phone within reach   PT Visit Diagnosis: Unsteadiness on feet (R26.81);Muscle weakness (generalized) (M62.81);Difficulty in walking, not elsewhere classified (R26.2)    Time: 5329-9242 PT Time Calculation (min) (ACUTE ONLY): 14 min   Charges:   PT Evaluation $PT Eval Moderate Complexity: 1 Mod         Ell[place]e Sia, [place], DPT Acute Rehabilitation Services Pager [phone] Office [phone]   [name] 03/08/2018, 4:52 PM

## 2018-03-08 NOTE — Progress Notes (Addendum)
Patient ID: [name], male   DOB: 16-Mar-1941, 77 y.o.   MRN: [id number]     Advanced Heart Failure Rounding Note  PCP-Cardiologist: No primary care provider on file.   Subjective:    [name] got IV Lasix yesterday evening and torsemide in the morning.  Minimal UOP.  Creatinine still trending down.  He feels weaker, feet are swollen making it hard to walk.  HR in 110s.   Coox 86% on milrinone 0.25 mcg/kg/min. CVP 13-15 currently.   Cr 3.86 -> 3.56 -> 3.43 -> 3.35 on milrinone.  Objective:   Weight Range: 97.5 kg Body mass index is 29.15 kg/m.   Vital Signs:   Temp:  [97.2 F (36.2 C)-97.5 F (36.4 C)] 97.2 F (36.2 C) (12/19 0357) Pulse Rate:  [64-125] 106 (12/19 0400) Resp:  [17-26] 17 (12/19 0400) BP: (98-126)/(67-88) 124/88 (12/19 0539) SpO2:  [91 %-95 %] 95 % (12/19 0400) Weight:  [97.5 kg] 97.5 kg (12/19 0624) Last BM Date: 02/23/2018  Weight change: Filed Weights   03/06/18 0456 03/07/18 0202 03/08/18 0624  Weight: 95.3 kg 96.3 kg 97.5 kg   Intake/Output:   Intake/Output Summary (Last 24 hours) at 03/08/2018 0751 Last data filed at 03/08/2018 0400 Gross per 24 hour  Intake 292.27 ml  Output 200 ml  Net 92.27 ml    Physical Exam    General: NAD Neck: JVP 12-14, no thyromegaly or thyroid nodule.  Lungs: Clear to auscultation bilaterally with normal respiratory effort. CV: Nondisplaced PMI.  Heart mildly tachy, irregular S1/S2, no S3/S4, no murmur.  1+ edema to knees, 2+ edema of feet.  Abdomen: Soft, nontender, no hepatosplenomegaly, no distention.  Skin: Intact without lesions or rashes.  Neurologic: Alert and oriented x 3.  Psych: Normal affect. Extremities: No clubbing or cyanosis.  HEENT: Normal.    Telemetry   Afib 100s-110s with very PVCs, personally reviewed.   EKG   No new tracings.    Labs    CBC Recent Labs    03/07/18 0315 03/08/18 0331  WBC 6.2 6.3  HGB 12.3* 12.0*  HCT 35.7* 35.9*  MCV 88.8 88.6  PLT 212 962    Basic Metabolic Panel Recent Labs    03/07/18 0315 03/08/18 0331  NA 129* 129*  K 3.4* 3.3*  CL 86* 86*  CO2 26 26  GLUCOSE 161* 185*  BUN 89* 87*  CREATININE 3.43* 3.35*  CALCIUM 8.4* 8.5*  MG 2.5*  --    Liver Function Tests Recent Labs    03/08/18 0331  AST 14*  ALT 16  ALKPHOS 63  BILITOT 1.7*  PROT 5.9*  ALBUMIN 3.4*   No results for input(s): LIPASE, AMYLASE in the last 72 hours. Cardiac Enzymes No results for input(s): CKTOTAL, CKMB, CKMBINDEX, TROPONINI in the last 72 hours.  BNP: BNP (last 3 results) Recent Labs    12/29/17 1048 02/22/18 1215  BNP 1,274.1* 2,025.5*    ProBNP (last 3 results) No results for input(s): PROBNP in the last 8760 hours.   D-Dimer No results for input(s): DDIMER in the last 72 hours. Hemoglobin A1C No results for input(s): HGBA1C in the last 72 hours. Fasting Lipid Panel No results for input(s): CHOL, HDL, LDLCALC, TRIG, CHOLHDL, LDLDIRECT in the last 72 hours. Thyroid Function Tests No results for input(s): TSH, T4TOTAL, T3FREE, THYROIDAB in the last 72 hours.  Invalid input(s): FREET3  Other results:   Imaging    No results found.   Medications:     Scheduled  Medications: . allopurinol  200 mg Oral Daily  . amiodarone  200 mg Oral BID  . atorvastatin  10 mg Oral Daily  . carvedilol  6.25 mg Oral BID WC  . cholecalciferol  1,000 Units Oral Daily  . DULoxetine  30 mg Oral Daily  . gabapentin  600 mg Oral TID  . hydrALAZINE  25 mg Oral Q8H  . insulin aspart  0-15 Units Subcutaneous TID WC  . insulin aspart  0-5 Units Subcutaneous QHS  . insulin aspart  3 Units Subcutaneous TID WC  . ipratropium  2 spray Each Nare Daily  . isosorbide mononitrate  30 mg Oral Daily  . mouth rinse  15 mL Mouth Rinse BID  . metolazone  5 mg Oral Once  . pantoprazole  40 mg Oral Daily  . potassium chloride  40 mEq Oral Once  . potassium chloride  40 mEq Oral Once  . sodium chloride flush  10-40 mL Intracatheter Q12H   . sodium chloride flush  3 mL Intravenous Q12H  . traZODone  50 mg Oral QHS  . Warfarin - Pharmacist Dosing Inpatient   Does not apply q1800    Infusions: . sodium chloride 5 mL/hr at 03/08/18 0400  . furosemide    . milrinone 0.25 mcg/kg/min (03/08/18 0400)    PRN Medications: sodium chloride, acetaminophen, ondansetron (ZOFRAN) IV, polyvinyl alcohol, sodium chloride flush, sodium chloride flush    Patient Profile   [name] is a 77 y.o. male  with history of CAD s/p CABG, permanent atrial fib, S/P AAA repair, ischemic cardiomyopathy/chronic systolic heart failure,  Medtronic CRT-D system.   Admitted from HF clinic 03/04/2018 with volume overload and AKI.   Assessment/Plan   1.Acute on chronic systolic CHF: Ischemic cardiomyopathy. 02/2018 Echo, EF 35-40%, stable. Medtronic CRT-D but LV lead turned off due to high threshold. 11/20109 Unable to place epicardial lead surgically. Patient was admitted earlier in 12/19 after [place] on 02/23/18 showed low output with CI 1.9 and elevated filling pressures. Creatinine was elevated to the 2.9 range.   He was started on milrinone and diuresed.  Milrinone weaned and he was sent home.  Admitted again with worsening SOB and recurrent worsening renal failure. Now back on milrinone 0.25, initially felt better but now feels weak.  CVP remains 13-15, co-ox 86% today.  Poor diuresis yesterday but creatinine trended down.  I am concerned that he may be milrinone-dependent, which is surprising given EF 35-40% range.  - Continue current milrinone for now, will follow renal function over the next several days to make sure it comes down.  He may end up needing home milrinone. In that case, he will need tunneled PICC (INR will need to trend down for this).  - Lasix 120 mg IV bid today with metolazone 5 mg po x 1.  -Continue Coreg 6.25 mg BID for now for rate control.  - Continue hydralazine 25 mg TID + Imdur 30 daily for now, BP stable.  - No  ACEI/ARB/ARNI/spironolactone with AKI.  2. Atrial fibrillation: Permanent. -Continue warfarin.   - Continue Coreg for rate control as above. 3. CAD: s/p CABG. No chest pain.   - No ASA given warfarin use.  - Continue statin. Good lipids 4/19.  4.AKI onCKD Stage III: Progressive cardiorenal syndrome, creatinine up to 3.8 at readmission.  Make sure to maintain BP (can back off on hydralazine if needed but per patient BP ok so far). sBP has been 110-130s. Creatinine 3.8 -> 3.5 -> 3.43 ->  3.35.  - If renal function continues to worsen, he may be a dialysis candidate. Will hold off on renal referral for now with gentle down-trend.  5. Hypokalemia: Replace K.   6. NSVT/PVCs: Very frequent.  - He is now on amiodarone 200 mg bid while on milrinone.  7. Deconditioning: Needs PT/OT.   [facility] following for potential home milrinone.   Medication concerns reviewed with patient and pharmacy team. Barriers identified: none at this time.   Length of Stay: 3  Loralie Champagne, MD  03/08/2018, 7:51 AM  Advanced Heart Failure Team Pager [phone] (M-F; 7a - 4p)  Please contact [facility] for night-coverage after hours (4p -7a ) and weekends on [email or web address]

## 2018-03-09 ENCOUNTER — Inpatient Hospital Stay (HOSPITAL_COMMUNITY): Payer: Medicare Other

## 2018-03-09 LAB — CBC
HCT: 34 % — ABNORMAL LOW (ref 39.0–52.0)
Hemoglobin: 11.2 g/dL — ABNORMAL LOW (ref 13.0–17.0)
MCH: 29.2 pg (ref 26.0–34.0)
MCHC: 32.9 g/dL (ref 30.0–36.0)
MCV: 88.8 fL (ref 80.0–100.0)
Platelets: 176 10*3/uL (ref 150–400)
RBC: 3.83 MIL/uL — ABNORMAL LOW (ref 4.22–5.81)
RDW: 15.3 % (ref 11.5–15.5)
WBC: 5.9 10*3/uL (ref 4.0–10.5)
nRBC: 0 % (ref 0.0–0.2)

## 2018-03-09 LAB — BASIC METABOLIC PANEL
Anion gap: 18 — ABNORMAL HIGH (ref 5–15)
Anion gap: 14 (ref 5–15)
BUN: 85 mg/dL — ABNORMAL HIGH (ref 8–23)
BUN: 84 mg/dL — ABNORMAL HIGH (ref 8–23)
CHLORIDE: 85 mmol/L — AB (ref 98–111)
CO2: 27 mmol/L (ref 22–32)
CO2: 30 mmol/L (ref 22–32)
Calcium: 8.6 mg/dL — ABNORMAL LOW (ref 8.9–10.3)
Calcium: 8.8 mg/dL — ABNORMAL LOW (ref 8.9–10.3)
Chloride: 83 mmol/L — ABNORMAL LOW (ref 98–111)
Creatinine, Ser: 3.53 mg/dL — ABNORMAL HIGH (ref 0.61–1.24)
Creatinine, Ser: 3.6 mg/dL — ABNORMAL HIGH (ref 0.61–1.24)
GFR calc Af Amer: 18 mL/min — ABNORMAL LOW (ref >60)
GFR calc Af Amer: 18 mL/min — ABNORMAL LOW (ref >60)
GFR calc non Af Amer: 15 mL/min — ABNORMAL LOW (ref >60)
GFR, EST NON AFRICAN AMERICAN: 16 mL/min — AB (ref >60)
GLUCOSE: 172 mg/dL — AB (ref 70–99)
Glucose, Bld: 168 mg/dL — ABNORMAL HIGH (ref 70–99)
Potassium: 2.8 mmol/L — ABNORMAL LOW (ref 3.5–5.1)
Potassium: 3.1 mmol/L — ABNORMAL LOW (ref 3.5–5.1)
Sodium: 128 mmol/L — ABNORMAL LOW (ref 135–145)
Sodium: 129 mmol/L — ABNORMAL LOW (ref 135–145)

## 2018-03-09 LAB — GLUCOSE, CAPILLARY
Glucose-Capillary: 145 mg/dL — ABNORMAL HIGH (ref 70–99)
Glucose-Capillary: 134 mg/dL — ABNORMAL HIGH (ref 70–99)
Glucose-Capillary: 167 mg/dL — ABNORMAL HIGH (ref 70–99)
Glucose-Capillary: 117 mg/dL — ABNORMAL HIGH (ref 70–99)

## 2018-03-09 LAB — PROTIME-INR
INR: 3.03
Prothrombin Time: 30.9 seconds — ABNORMAL HIGH (ref 11.4–15.2)

## 2018-03-09 LAB — COOXEMETRY PANEL
Carboxyhemoglobin: 1.9 % — ABNORMAL HIGH (ref 0.5–1.5)
Methemoglobin: 1.1 % (ref 0.0–1.5)
O2 SAT: 64.1 %
TOTAL HEMOGLOBIN: 12.1 g/dL (ref 12.0–16.0)

## 2018-03-09 LAB — MAGNESIUM: Magnesium: 2.4 mg/dL (ref 1.7–2.4)

## 2018-03-09 MED ORDER — AMIODARONE HCL IN DEXTROSE 360-4.14 MG/200ML-% IV SOLN
60.0000 mg/h | INTRAVENOUS | Status: AC
  Administered 2018-03-09 (×2): 60 mg/h via INTRAVENOUS
  Filled 2018-03-09 (×2): qty 200

## 2018-03-09 MED ORDER — TAMSULOSIN HCL 0.4 MG PO CAPS
0.4000 mg | ORAL_CAPSULE | Freq: Every day | ORAL | Status: DC
  Administered 2018-03-09 – 2018-03-10 (×2): 0.4 mg via ORAL
  Filled 2018-03-09 (×2): qty 1

## 2018-03-09 MED ORDER — ENSURE ENLIVE PO LIQD
237.0000 mL | Freq: Two times a day (BID) | ORAL | Status: DC
  Administered 2018-03-09 – 2018-03-10 (×3): 237 mL via ORAL

## 2018-03-09 MED ORDER — ACETAMINOPHEN 325 MG PO TABS
650.0000 mg | ORAL_TABLET | ORAL | Status: DC | PRN
  Administered 2018-03-09 – 2018-03-12 (×3): 650 mg via ORAL
  Filled 2018-03-09 (×2): qty 2

## 2018-03-09 MED ORDER — POTASSIUM CHLORIDE CRYS ER 20 MEQ PO TBCR
40.0000 meq | EXTENDED_RELEASE_TABLET | Freq: Three times a day (TID) | ORAL | Status: DC
  Administered 2018-03-09 – 2018-03-10 (×5): 40 meq via ORAL
  Filled 2018-03-09 (×5): qty 2

## 2018-03-09 MED ORDER — AMIODARONE HCL IN DEXTROSE 360-4.14 MG/200ML-% IV SOLN
30.0000 mg/h | INTRAVENOUS | Status: DC
  Administered 2018-03-09 – 2018-03-13 (×9): 30 mg/h via INTRAVENOUS
  Filled 2018-03-09 (×11): qty 200

## 2018-03-09 MED ORDER — POTASSIUM CHLORIDE 10 MEQ/50ML IV SOLN
10.0000 meq | INTRAVENOUS | Status: AC
  Administered 2018-03-09 (×4): 10 meq via INTRAVENOUS
  Filled 2018-03-09 (×4): qty 50

## 2018-03-09 MED ORDER — HYDRALAZINE HCL 10 MG PO TABS: 10.0000 mg | ORAL_TABLET | Freq: Three times a day (TID) | ORAL | Status: DC

## 2018-03-09 MED ORDER — POTASSIUM CHLORIDE CRYS ER 20 MEQ PO TBCR
40.0000 meq | EXTENDED_RELEASE_TABLET | Freq: Once | ORAL | Status: DC
  Filled 2018-03-09: qty 2

## 2018-03-09 MED ORDER — AMIODARONE LOAD VIA INFUSION
150.0000 mg | Freq: Once | INTRAVENOUS | Status: AC
  Administered 2018-03-09: 150 mg via INTRAVENOUS
  Filled 2018-03-09: qty 83.34

## 2018-03-09 MED ORDER — POTASSIUM CHLORIDE CRYS ER 20 MEQ PO TBCR: 40.0000 meq | EXTENDED_RELEASE_TABLET | Freq: Once | ORAL | Status: DC

## 2018-03-09 MED ORDER — ONDANSETRON HCL 4 MG/2ML IJ SOLN
4.0000 mg | Freq: Four times a day (QID) | INTRAMUSCULAR | Status: DC | PRN
  Administered 2018-03-13: 4 mg via INTRAVENOUS
  Filled 2018-03-09: qty 2

## 2018-03-09 MED ORDER — METOLAZONE 5 MG PO TABS
5.0000 mg | ORAL_TABLET | Freq: Once | ORAL | Status: AC
  Administered 2018-03-09: 5 mg via ORAL
  Filled 2018-03-09: qty 1

## 2018-03-09 NOTE — Progress Notes (Addendum)
Patient ID: [name], male   DOB: 1940-12-03, 77 y.o.   MRN: [id number]     Advanced Heart Failure Rounding Note  PCP-Cardiologist: No primary care provider on file.   Subjective:    Given IV lasix and metolazone yesterday. Cr 3.35 -> 3.53. K 2.8. I/O positive 500 cc. 214 lbs.   Diuretics increased yesterday with very poor response. Minimal UOP and Cr back up as above. He states he felt very sluggish yesterday, but then had an OK evening.  He feels like he has to urinate today but cannot get much out.    Coox pending on milrinone 0.25 mcg/kg/min. CVP 16-17 cm  Bladder scan done this morning with nurse, >900 cc.   Objective:   Weight Range: 97.4 kg Body mass index is 29.12 kg/m.   Vital Signs:   Temp:  [97.4 F (36.3 C)-98.2 F (36.8 C)] 98.2 F (36.8 C) (12/20 0739) Pulse Rate:  [53-128] 83 (12/20 0739) Resp:  [13-27] 13 (12/20 0739) BP: (88-126)/(54-88) 100/54 (12/20 0739) SpO2:  [90 %-98 %] 96 % (12/20 0739) Weight:  [97.4 kg] 97.4 kg (12/20 0311) Last BM Date: 03/08/18  Weight change: Filed Weights   03/07/18 0202 03/08/18 0624 03/09/18 0311  Weight: 96.3 kg 97.5 kg 97.4 kg   Intake/Output:   Intake/Output Summary (Last 24 hours) at 03/09/2018 0759 Last data filed at 03/09/2018 0600 Gross per 24 hour  Intake 743.58 ml  Output 200 ml  Net 543.58 ml    Physical Exam    General: NAD Neck: JVP 16, no thyromegaly or thyroid nodule.  Lungs: Clear to auscultation bilaterally with normal respiratory effort. CV: Nondisplaced PMI.  Heart mildly tachy, irregular S1/S2, no S3/S4, no murmur.  1+ edema to knees, 2+ edema of feet.  Abdomen: Soft, nontender, no hepatosplenomegaly, no distention.  Skin: Intact without lesions or rashes.  Neurologic: Alert and oriented x 3.  Psych: Normal affect. Extremities: No clubbing or cyanosis.  HEENT: Normal.    Telemetry   Afib 90s-110s with very PVCs, personally reviewed.   EKG   No new tracings.    Labs      CBC Recent Labs    03/08/18 0331 03/09/18 0508  WBC 6.3 5.9  HGB 12.0* 11.2*  HCT 35.9* 34.0*  MCV 88.6 88.8  PLT 196 836   Basic Metabolic Panel Recent Labs    03/07/18 0315 03/08/18 0331 03/09/18 0508  NA 129* 129* 128*  K 3.4* 3.3* 2.8*  CL 86* 86* 83*  CO2 26 26 27   GLUCOSE 161* 185* 168*  BUN 89* 87* 85*  CREATININE 3.43* 3.35* 3.53*  CALCIUM 8.4* 8.5* 8.6*  MG 2.5*  --   --    Liver Function Tests Recent Labs    03/08/18 0331  AST 14*  ALT 16  ALKPHOS 63  BILITOT 1.7*  PROT 5.9*  ALBUMIN 3.4*   No results for input(s): LIPASE, AMYLASE in the last 72 hours. Cardiac Enzymes No results for input(s): CKTOTAL, CKMB, CKMBINDEX, TROPONINI in the last 72 hours.  BNP: BNP (last 3 results) Recent Labs    12/29/17 1048 02/22/18 1215  BNP 1,274.1* 2,025.5*    ProBNP (last 3 results) No results for input(s): PROBNP in the last 8760 hours.   D-Dimer No results for input(s): DDIMER in the last 72 hours. Hemoglobin A1C No results for input(s): HGBA1C in the last 72 hours. Fasting Lipid Panel No results for input(s): CHOL, HDL, LDLCALC, TRIG, CHOLHDL, LDLDIRECT in the last 72 hours.  Thyroid Function Tests No results for input(s): TSH, T4TOTAL, T3FREE, THYROIDAB in the last 72 hours.  Invalid input(s): FREET3  Other results:   Imaging    No results found.   Medications:     Scheduled Medications: . allopurinol  200 mg Oral Daily  . amiodarone  200 mg Oral BID  . atorvastatin  10 mg Oral Daily  . carvedilol  6.25 mg Oral BID WC  . cholecalciferol  1,000 Units Oral Daily  . DULoxetine  30 mg Oral Daily  . gabapentin  600 mg Oral TID  . insulin aspart  0-15 Units Subcutaneous TID WC  . insulin aspart  0-5 Units Subcutaneous QHS  . insulin aspart  3 Units Subcutaneous TID WC  . ipratropium  2 spray Each Nare Daily  . mouth rinse  15 mL Mouth Rinse BID  . pantoprazole  40 mg Oral Daily  . potassium chloride  40 mEq Oral Once  . sodium  chloride flush  10-40 mL Intracatheter Q12H  . sodium chloride flush  3 mL Intravenous Q12H  . traZODone  50 mg Oral QHS  . Warfarin - Pharmacist Dosing Inpatient   Does not apply q1800    Infusions: . sodium chloride 5 mL/hr at 03/09/18 0600  . furosemide 120 mg (03/08/18 1746)  . milrinone 0.25 mcg/kg/min (03/09/18 0750)    PRN Medications: sodium chloride, acetaminophen, ondansetron (ZOFRAN) IV, polyvinyl alcohol, sodium chloride flush, sodium chloride flush    Patient Profile   [name] is a 77 y.o. male  with history of CAD s/p CABG, permanent atrial fib, S/P AAA repair, ischemic cardiomyopathy/chronic systolic heart failure,  [employer]tronic CRT-D system.   Admitted from HF clinic 03/01/2018 with volume overload and AKI.   Assessment/Plan   1.Acute on chronic systolic CHF: Ischemic cardiomyopathy. 02/2018 Echo, EF 35-40%, stable. Medtronic CRT-D but LV lead turned off due to high threshold. Has a very wide QRS complex.  11/20109 Unable to place epicardial lead surgically. Patient was admitted earlier in 12/19 after [place] on 02/23/18 showed low output with CI 1.9 and elevated filling pressures. Creatinine was elevated to the 2.9 range.   He was started on milrinone and diuresed.  Milrinone weaned and he was sent home.  Admitted again with worsening SOB and recurrent worsening renal failure. Now back on milrinone 0.25 in an effort to facilitate better urine output, initially felt better but now feels weak.  CVP 16-17 with poor UOP, co-ox pending today.  Difficulty getting urine out yesterday afternoon and today, bladder scanned at bedside this morning with >900 cc urine in bladder.  - Continue milrinone 0.25 for now - Place foley and will repeat lasix 120 mg IV bid today with metolazone today.  It is possible that obstruction/BPH is playing a role in renal failure/poor UOP, will see how he does with foley in place.  - Replace K aggressively and recheck BMET in the evening.    -Continue Coreg 6.25 mg BID for now for rate control.  - Will hold hydralazine/Imdur for now to keep BP up.   - No ACEI/ARB/ARNI/spironolactone with AKI.  2. Atrial fibrillation: Permanent  -Continue warfarin.   - Continue Coreg for rate control as above. 3. CAD: s/p CABG.  No chest pain.   - No ASA given warfarin use.  - Continue statin. Good lipids 4/19.  4.AKI onCKD Stage III: Progressive cardiorenal syndrome, creatinine up to 3.8 at readmission. Creatinine 3.8 -> 3.5 -> 3.43 -> 3.35 -> 3.53. Patient complained of  urinary retention over the last day or so, bladder scan today with >900 cc.  Suspect at least some role for urinary obstruction as well.   - Place foley now and add Flomax.  - Need renal ultrasound.  - Will ask renal to see. I think he would be a reasonable HD candidate if this becomes necessary, though may be difficult for him to manage long term.  5. Hypokalemia: K 2.8.  - Will supplement aggressively.    6. PVCs: Frequent.  - Continue amiodarone 200 mg BID while on milrinone.  7. Deconditioning:  - Continue PT/OT.   He should be able to come off milrinone eventually, using to try to facilitate urine output.  Question at this point will be how much improvement he gets from foley catheter/correction of urinary obstruction.  If renal function continues to worsen, I do think that he could manage HD.   Length of Stay: 4  Loralie Champagne 03/09/2018 8:32 AM  Advanced Heart Failure Team Pager [phone] (M-F; 7a - 4p)  Please contact [facility] for night-coverage after hours (4p -7a ) and weekends on [email or web address]  Mr Zajkowski went into multiple runs of NSVT this morning.  We stopped milrinone and changed amiodarone to IV.  Off milrinone, co-ox 64%.  Will try to keep him off milrinone.   Loralie Champagne 03/09/2018 12:53 PM

## 2018-03-09 NOTE — Progress Notes (Signed)
  Paged to room with patient having episodes of VT in 140-150s.  Pt asymptomatic on my arrival and VT broke multiple times without intervention, but now sustained in 130-140s.  K 2.8 this am and supp ordered. M 2.5 several days ago, repeat pending.   Discussed with Dr. Aundra Dubin. Will bolus IV amiodarone and hold milrinone for now.    Legrand Como [address] [place], PA-C 03/09/2018 9:01 AM

## 2018-03-09 NOTE — Evaluation (Signed)
Occupational Therapy Evaluation Patient Details Name: [name] MRN: [id number] DOB: [birth year] Today's Date: 03/09/2018    History of Present Illness Pt is a 77 y.o. M with significant PMH of back surgery, CAD s/p CABG, a fib, s/p AAA repair, chronic systolic heart failure who was admitted with volume overload and AKI.   Clinical Impression   Pt was independent in self care and walking with a cane at baseline. He presents with generalized weakness and decreased activity tolerance. Pt requires set up to moderate assistance with ADL. Will follow acutely.    Follow Up Recommendations  Home health OT;Supervision/Assistance - 24 hour    Equipment Recommendations  3 in 1 bedside commode;Other (comment)(rollator)    Recommendations for Other Services       Precautions / Restrictions Precautions Precautions: Fall Restrictions Weight Bearing Restrictions: No      Mobility Bed Mobility Overal bed mobility: Modified Independent             General bed mobility comments: HOB elevated, increased time  Transfers                 General transfer comment: pt declined    Balance Overall balance assessment: Needs assistance Sitting-balance support: Feet supported Sitting balance-Leahy Scale: Good                                     ADL either performed or assessed with clinical judgement   ADL Overall ADL's : Needs assistance/impaired Eating/Feeding: Independent;Sitting   Grooming: Set up;Sitting   Upper Body Bathing: Minimal assistance;Sitting   Lower Body Bathing: Moderate assistance;Sit to/from stand   Upper Body Dressing : Minimal assistance;Sitting   Lower Body Dressing: Moderate assistance;Sit to/from stand Lower Body Dressing Details (indicate cue type and reason): able to cross foot over opposite knee, helps R leg with hands               General ADL Comments: pt declined standing/OOB due to fatigue     Vision Baseline  Vision/History: Wears glasses Wears Glasses: At all times Patient Visual Report: No change from baseline       Perception     Praxis      Pertinent Vitals/Pain Pain Assessment: Faces Faces Pain Scale: Hurts little more Pain Location: chronic back pain Pain Descriptors / Indicators: Aching Pain Intervention(s): Monitored during session;Repositioned     Hand Dominance Right   Extremity/Trunk Assessment Upper Extremity Assessment Upper Extremity Assessment: Overall WFL for tasks assessed(intermittent jerking movements with self feeding)   Lower Extremity Assessment Lower Extremity Assessment: Defer to PT evaluation   Cervical / Trunk Assessment Cervical / Trunk Assessment: Kyphotic;Other exceptions Cervical / Trunk Exceptions: chronic back pain   Communication Communication Communication: HOH   Cognition Arousal/Alertness: Awake/alert Behavior During Therapy: WFL for tasks assessed/performed Overall Cognitive Status: Within Functional Limits for tasks assessed                                     General Comments  pt with poor activity tolerance    Exercises     Shoulder Instructions      Home Living Family/patient expects to be discharged to:: Private residence Living Arrangements: Spouse/significant other Available Help at Discharge: Family;Available 24 hours/day Type of Home: House Home Access: Stairs to enter [employer] of Steps: 2 Entrance Stairs-Rails: Right  Home Layout: Able to live on main level with bedroom/bathroom     Bathroom Shower/Tub: Occupational psychologist: Standard     Home Equipment: Cane - single point;Walker - 2 wheels          Prior Functioning/Environment Level of Independence: Independent with assistive device(s)        Comments: uses cane for mobility, drives, community ambulatory        OT Problem List: Decreased activity tolerance;Impaired balance (sitting and/or standing);Decreased  coordination;Decreased knowledge of use of DME or AE;Cardiopulmonary status limiting activity      OT Treatment/Interventions: Self-care/ADL training;Patient/family education;Therapeutic activities;Energy conservation;DME and/or AE instruction    OT Goals(Current goals can be found in the care plan section) Acute Rehab OT Goals Patient Stated Goal: "not be so tired." OT Goal Formulation: With patient Time For Goal Achievement: 03/23/18 Potential to Achieve Goals: Good ADL Goals Pt Will Perform Grooming: with supervision;standing Pt Will Perform Lower Body Bathing: with supervision;with set-up;sit to/from stand Pt Will Perform Lower Body Dressing: with supervision;with set-up;sit to/from stand Pt Will Transfer to Toilet: with supervision;ambulating;bedside commode(over toilet) Pt Will Perform Toileting - Clothing Manipulation and hygiene: with supervision;sit to/from stand Additional ADL Goal #1: Pt will employ energy conservation strategies during ADL and mobility independently.  OT Frequency: Min 2X/week   Barriers to D/C:            Co-evaluation              AM-PAC OT "6 Clicks" Daily Activity     Outcome Measure Help from another person eating meals?: None Help from another person taking care of personal grooming?: A Little Help from another person toileting, which includes using toliet, bedpan, or urinal?: A Little Help from another person bathing (including washing, rinsing, drying)?: A Lot Help from another person to put on and taking off regular upper body clothing?: A Little Help from another person to put on and taking off regular lower body clothing?: A Lot 6 Click Score: 17   End of Session Equipment Utilized During Treatment: Oxygen(2L) Nurse Communication: (ok to give pt a Nepro)  Activity Tolerance: Patient limited by fatigue Patient left: in bed;with call bell/phone within reach;with family/visitor present  OT Visit Diagnosis: Unsteadiness on feet  (R26.81);Other abnormalities of gait and mobility (R26.89);Pain;Muscle weakness (generalized) (M62.81);Other (comment)(decreased activity tolerance)                Time: 1430-1453 OT Time Calculation (min): 23 min Charges:  OT General Charges $OT Visit: 1 Visit OT Evaluation $OT Eval Moderate Complexity: 1 Mod OT Treatments $Self Care/Home Management : 8-22 mins  Malka So 03/09/2018, 3:13 PM  Nestor Lewandowsky, OTR/L Acute Rehabilitation Services Pager: [phone] Office: [phone]

## 2018-03-09 NOTE — Progress Notes (Addendum)
[place] for warfarin Indication: atrial fibrillation  Allergies  Allergen Reactions  . Avelox [Moxifloxacin Hcl In Nacl] Swelling, Rash and Other (See Comments)    Patient became hypotensive after infusion started Because of a history of documented adverse serious drug reaction;Medi Alert bracelet  is recommended  . Other Rash and Swelling    Patient became hypotensive after infusion started Because of a history of documented adverse serious drug reaction;Medi Alert braceletis recommended  . Penicillins Anaphylaxis, Swelling and Other (See Comments)    Because of a history of documented adverse serious drug reaction;Medi Alert bracelet  is recommended Has patient had a PCN reaction causing immediate rash, facial/tongue/throat swelling, SOB or lightheadedness with hypotension: Yes Has patient had a PCN reaction causing severe rash involving mucus membranes or skin necrosis: No Has patient had a PCN reaction that required hospitalization: Unknown Has patient had a PCN reaction occurring within the last 10 years: No  . Tape Other (See Comments)    "skin is thin; plastic tape rips skin off when removed; please use paper tape"/spouse    Patient Measurements: Height: 6' (182.9 cm) Weight: 214 lb 11.7 oz (97.4 kg) IBW/kg (Calculated) : 77.6  Vital Signs: Temp: 98.2 F (36.8 C) (12/20 0739) Temp Source: Oral (12/20 0739) BP: 100/54 (12/20 0739) Pulse Rate: 83 (12/20 0739)  Labs: Recent Labs    03/07/18 0315 03/08/18 0331 03/09/18 0508  HGB 12.3* 12.0* 11.2*  HCT 35.7* 35.9* 34.0*  PLT 212 196 176  LABPROT 38.0* 36.3* 30.9*  INR 3.95 3.72 3.03  CREATININE 3.43* 3.35* 3.53*    Estimated Creatinine Clearance: 21.5 mL/min (A) (by C-G formula based on SCr of 3.53 mg/dL (H)).   Medical History: Past Medical History:  Diagnosis Date  . AAA (abdominal aortic aneurysm) ([place])    Surgical repair 11/2002.  . Adenomatous colon polyp   .  AICD (automatic cardioverter/defibrillator) present   . Alcohol ingestion of more than four drinks per week    Excess beer  not a dependency problem  . Aortic valve sclerosis   . Arthritis    "all over" (03/06/2018)  . Atrial fibrillation ([place])    Previous long-term amiodarone therapy with multiple cardioversions / amiodarone stopped September, 2009  . Atrial flutter [facility])    Started November, 2010, Left-sided and cannot ablate  . Bony abnormality    Patient's manubrium is slightly displaced to the right  . CAD (coronary artery disease)    a. s/p CABG 2004;  b. 02/2016 Cath: LM nl, LAD 70p, 146m, D1 50, D2 50, RI 50ost, LCX 73m, OM2 100, OM3 100, RCA 70p, 100d, VG->OM3 ok, LIMA->LAD 60ost, VG->RPDA  ok.  Marland Kitchen CAD (coronary artery disease)    Doppler, December, 2013, 0-39% bilateral  . 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 01/2013 MDT DTBA 1D1 [place] CRT-D, ser # [id number] H.  . Chronic back pain   . Chronic systolic CHF (congestive heart failure) ([place])    a. 02/2016 Echo: EF 35-40%, diff HK, triv AI, mildly dil Ao root 48mm, mild MR, sev dil LA, triv TR.  Marland Kitchen Chronotropic incompetence    IV pacing rate adjusted  . CKD (chronic kidney disease), stage III ([place])   . COPD (chronic obstructive pulmonary disease) ([place])   . Dilated aortic root ([place])   . Discolored skin   . Diverticulosis   . Drug therapy    Redness and swelling with Avelox infusion May 24, 2011  .  Dvt femoral (deep venous thrombosis) (HCC) <2002  . Dyspnea   . Eye abnormality    Ophthalmologist questions a clot in one of his eyes, May, 2012  . GERD (gastroesophageal reflux disease)   . Gout   . Gout    "on daily RX" (02/21/2018)  . Hyperlipidemia   . Hypertension   . Internal hemorrhoids   . Ischemic cardiomyopathy   . Left atrial thrombus    Remote past... cardioversions done since that time  . Mitral regurgitation    Mild echo  . Myocardial infarction ([place]) ~ 2002  . Nasal  drainage    Chronic  . On home oxygen therapy    "2L prn; ~  4h/day" (02/28/2018)  . Overweight(278.02)   . Pericardial effusion   . Pleural effusion    Large loculated effusion on the left side November, 2011. This was tapped. It was exudative. Cytology revealed no cancer no proof of mesothelioma area pulmonary team felt that no further workup was needed  . Pleural thickening   . Pneumonia    "several times" (03/01/2018)  . 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 Presence of permanent cardiac pacemaker   . SOB (shortness of breath)    Large left effusion/ thoracentesis/hospitalization/November, 2011... exudated.. cytology negative.. Dr.Wert.. no proof of mesothelioma  . Spinal stenosis    Surgery Dr.Elsner  . Thrombophlebitis of superficial veins of upper extremities    Possible venous stenosis from defibrillator  . Type II diabetes mellitus ([place]) 2008  . Venous insufficiency    Toe discoloration chronic  . Ventral hernia    April, 2014, result of his abdominal surgery  . Warfarin anticoagulation   . Wide-complex tachycardia (HCC)     Assessment: 81 yoM on warfarin PTA for hx of PAF. INR on admit was 4.7 and bumped to up above 5 despite holding warfarin.  Now trending down slowly to 3  (last dose 12/15 at home). CBC stable, LFTs wnl. Will continue to hold and would consider starting heparin if INR drops below 2 until patient turns the corner and no procedures planned.  Cr trending up with minimal uop hopefully will not progress to need HD  *PTA Warfarin Dose = 2.5mg  daily except 5mg  Tues  Goal of Therapy:  INR 2-3 Monitor platelets by anticoagulation protocol: Yes   Plan:  -Hold warfarin  -Monitor daily INR, CBC, s/sx bleeding -Discuss plan with MD of Heparin if INR <2 over weekend or restart warfarin   Bonnita Nasuti Pharm.D. CPP, BCPS Clinical Pharmacist [phone] 03/09/2018 9:31 AM

## 2018-03-09 NOTE — Care Management Note (Addendum)
Case Management Note  Patient Details  Name: [name] MRN: [id number] Date of Birth: 1941/03/10  Subjective/Objective:  Pt admitted with CHF- recently discharged after milrinone infusion                  Action/Plan:  PTA independent from home with wife.  Pt recently discharged home on oxygen.  Pt may need milrinone at discharge, DME agency choice given and [facility] chosen - [facility] following   Expected Discharge Date:                  Expected Discharge Plan:  [place]  In-House Referral:     Discharge planning Services     Post Acute Care Choice:  Durable Medical Equipment Choice offered to:     DME Arranged:    DME Agency:     HH Arranged:    [place] Agency:     Status of Service:  In process, will continue to follow  If discussed at Long Length of Stay Meetings, dates discussed:    Additional Comments: 03/09/2018 CM provided medicare.gov HH list to pt - if pt requires HH at discharge he chose [facility]  Pt is also on home oxygen PRN 2 liters supplied by [facility] Maryclare Labrador, RN 03/09/2018, 4:47 PM

## 2018-03-09 NOTE — Consult Note (Signed)
[facility] Renal Consultation Note  Requesting MD: Aundra Dubin Indication for Consultation: Acute on chronic renal failure  HPI:  [name] is a 77 y.o. male with past medical history significant for diabetes mellitus, hyperlipidemia, multiple back surgeries limiting mobility, as well as coronary artery disease status post CABG with cardiomyopathy-ejection fraction 35 to 40%, ICD, permanent atrial fibrillation.  He is also status post AAA repair and has gout.  Patient's wife reports ambulating reasonably well up to November of this year when he has had 3 hospitalizations in succession all for cardiac issues.  Patient seems to have baseline CKD with a creatinine of between 1.4 and 1.7 since 2017.  Creatinine was 1.5 on January 31, 2018.  Hospitalization in November was for an attempted lead revision that was not successful.  He then was hospitalized in early December for worsening heart failure-creatinine was higher in the mid twos.  He was placed on milrinone, given diuretics, milrinone was weaned off and he was discharged on 12/11 with a creatinine of 2.6.  He was then readmitted on 12/16 with similar symptoms.  Creatinine on admission was 3.8, 3.5 today.  In talking with the patient and his wife he has had a lot of trouble with urinary obstructive symptoms.  Foley catheter was placed today with immediate return of over a liter of urine.  His blood pressure has been soft with occasional drops and he has had intermittent arrhythmias on the milrinone-now started on amiodarone.  He admits to feeling weak.  He also has edema-oxygen saturation is adequate on nasal cannula  Creatinine  Date/Time Value Ref Range Status  11/09/2017 01:09 PM 1.50 (H) 0.60 - 1.20 mg/dL Final  08/08/2017 12:59 PM 1.71 (H) 0.70 - 1.30 mg/dL Final  05/03/2017 01:08 PM 1.40 (H) 0.60 - 1.20 mg/dL Final  03/09/2016 01:20 PM 1.9 (H) 0.7 - 1.3 mg/dL Final  11/04/2015 01:30 PM 1.9 (H) 0.7 - 1.3 mg/dL Final   05/14/2015 01:24 PM 2.2 (H) 0.7 - 1.3 mg/dL Final  12/03/2014 01:45 PM 2.0 (H) 0.7 - 1.3 mg/dL Final   Creat  Date/Time Value Ref Range Status  01/30/2017 12:55 PM 2.1 (H) 0.6 - 1.2 mg/dl Final  11/08/2016 12:49 PM 1.8 (H) 0.6 - 1.2 mg/dl Final  10/04/2016 01:00 PM 2.0 (H) 0.6 - 1.2 mg/dl Final  08/01/2016 12:49 PM 1.7 (H) 0.6 - 1.2 mg/dl Final  09/08/2015 09:56 AM 1.58 (H) 0.70 - 1.18 mg/dL Final    Comment:      For patients > or = 76 years of age: The upper reference limit for Creatinine is approximately 13% higher for people identified as African-American.     08/31/2015 04:03 PM 2.17 (H) 0.70 - 1.18 mg/dL Final    Comment:      For patients > or = 76 years of age: The upper reference limit for Creatinine is approximately 13% higher for people identified as African-American.     06/22/2015 11:23 AM 1.71 (H) 0.70 - 1.18 mg/dL Final  04/22/2014 01:50 PM 1.6 (H) 0.6 - 1.2 mg/dl Final  04/02/2014 11:38 AM 2.0 (H) 0.6 - 1.2 mg/dl Final   Creatinine, Ser  Date/Time Value Ref Range Status  03/09/2018 05:08 AM 3.53 (H) 0.61 - 1.24 mg/dL Final  03/08/2018 03:31 AM 3.35 (H) 0.61 - 1.24 mg/dL Final  03/07/2018 03:15 AM 3.43 (H) 0.61 - 1.24 mg/dL Final  03/06/2018 12:00 PM 3.45 (H) 0.61 - 1.24 mg/dL Final  03/06/2018 04:05 AM 3.56 (H) 0.61 - 1.24 mg/dL  Final  02/19/2018 12:23 PM 3.86 (H) 0.61 - 1.24 mg/dL Final  02/28/2018 05:26 AM 2.63 (H) 0.61 - 1.24 mg/dL Final  02/27/2018 04:02 AM 2.80 (H) 0.61 - 1.24 mg/dL Final  02/26/2018 04:56 AM 2.81 (H) 0.61 - 1.24 mg/dL Final  02/25/2018 05:15 AM 2.66 (H) 0.61 - 1.24 mg/dL Final  02/24/2018 05:29 AM 2.63 (H) 0.61 - 1.24 mg/dL Final  02/23/2018 05:56 PM 2.72 (H) 0.61 - 1.24 mg/dL Final  02/22/2018 12:14 PM 2.87 (H) 0.61 - 1.24 mg/dL Final  01/31/2018 04:00 AM 1.50 (H) 0.61 - 1.24 mg/dL Final  01/25/2018 09:36 AM 1.54 (H) 0.61 - 1.24 mg/dL Final  01/10/2018 10:51 AM 1.48 (H) 0.61 - 1.24 mg/dL Final  01/04/2018 01:52 PM 1.77 (H) 0.61  - 1.24 mg/dL Final  12/29/2017 10:48 AM 1.39 (H) 0.61 - 1.24 mg/dL Final  11/14/2017 10:20 AM 1.48 (H) 0.61 - 1.24 mg/dL Final  10/11/2017 01:39 PM 1.62 (H) 0.40 - 1.50 mg/dL Final  10/11/2017 01:39 PM 1.62 (H) 0.40 - 1.50 mg/dL Final  09/28/2017 06:41 AM 1.86 (H) 0.61 - 1.24 mg/dL Final  09/06/2017 10:54 AM 1.45 (H) 0.61 - 1.24 mg/dL Final  06/20/2017 11:38 AM 1.45 (H) 0.61 - 1.24 mg/dL Final  05/22/2017 01:50 PM 1.63 (H) 0.61 - 1.24 mg/dL Final  05/02/2017 10:07 AM 1.55 (H) 0.61 - 1.24 mg/dL Final  04/11/2017 02:47 PM 1.61 (H) 0.61 - 1.24 mg/dL Final  04/07/2017 01:40 PM 1.82 (H) 0.40 - 1.50 mg/dL Final  01/27/2017 03:09 PM 1.71 (H) 0.61 - 1.24 mg/dL Final  01/11/2017 02:41 PM 2.21 (H) 0.61 - 1.24 mg/dL Final  12/01/2016 11:34 AM 1.79 (H) 0.61 - 1.24 mg/dL Final  11/07/2016 02:33 PM 1.50 (H) 0.61 - 1.24 mg/dL Final  10/19/2016 12:10 PM 1.56 (H) 0.61 - 1.24 mg/dL Final  09/02/2016 02:39 PM 1.69 (H) 0.40 - 1.50 mg/dL Final  08/22/2016 08:08 AM 1.48 (H) 0.61 - 1.24 mg/dL Final  08/10/2016 01:57 PM 1.85 (H) 0.61 - 1.24 mg/dL Final  07/11/2016 08:45 AM 1.83 (H) 0.61 - 1.24 mg/dL Final  07/06/2016 04:02 AM 1.58 (H) 0.61 - 1.24 mg/dL Final  07/05/2016 03:58 AM 1.46 (H) 0.61 - 1.24 mg/dL Final  07/04/2016 03:47 AM 1.61 (H) 0.61 - 1.24 mg/dL Final  07/04/2016 12:02 AM 1.60 (H) 0.61 - 1.24 mg/dL Final  07/03/2016 04:17 AM 1.49 (H) 0.61 - 1.24 mg/dL Final  07/02/2016 10:27 AM 1.70 (H) 0.61 - 1.24 mg/dL Final  04/11/2016 10:37 AM 1.32 (H) 0.76 - 1.27 mg/dL Final  03/30/2016 12:43 PM 1.77 (H) 0.76 - 1.27 mg/dL Final  03/23/2016 03:25 AM 1.65 (H) 0.61 - 1.24 mg/dL Final  03/22/2016 05:33 PM 1.77 (H) 0.61 - 1.24 mg/dL Final  03/20/2016 12:03 PM 1.49 (H) 0.61 - 1.24 mg/dL Final  03/19/2016 11:05 AM 1.36 (H) 0.61 - 1.24 mg/dL Final  03/18/2016 03:37 AM 1.49 (H) 0.61 - 1.24 mg/dL Final  03/17/2016 04:51 AM 1.61 (H) 0.61 - 1.24 mg/dL Final  03/16/2016 01:02 AM 1.58 (H) 0.61 - 1.24 mg/dL Final   12/30/2015 01:27 PM 1.60 (H) 0.76 - 1.27 mg/dL Final     PMHx:   Past Medical History:  Diagnosis Date  . AAA (abdominal aortic aneurysm) ([place])    Surgical repair 11/2002.  . Adenomatous colon polyp   . AICD (automatic cardioverter/defibrillator) present   . Alcohol ingestion of more than four drinks per week    Excess beer  not a dependency problem  . Aortic valve sclerosis   . Arthritis    "  all over" (03/20/2018)  . Atrial fibrillation ([place])    Previous long-term amiodarone therapy with multiple cardioversions / amiodarone stopped September, 2009  . Atrial flutter [facility])    Started November, 2010, Left-sided and cannot ablate  . Bony abnormality    Patient's manubrium is slightly displaced to the right  . CAD (coronary artery disease)    a. s/p CABG 2004;  b. 02/2016 Cath: LM nl, LAD 70p, 167m D1 50, D2 50, RI 50ost, LCX 772mOM2 100, OM3 100, RCA 70p, 100d, VG->OM3 ok, LIMA->LAD 60ost, VG->RPDA  ok.  . Marland KitchenAD (coronary artery disease)    Doppler, December, 2013, 0-39% bilateral  . 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 01/2013 MDT DTBA 1D1 ViAuburn BilberryRT-D, ser # [id number].  . Chronic back pain   . Chronic systolic CHF (congestive heart failure) ([place]   a. 02/2016 Echo: EF 35-40%, diff HK, triv AI, mildly dil Ao root 4132mmild MR, sev dil LA, triv TR.  . CMarland Kitchenronotropic incompetence    IV pacing rate adjusted  . CKD (chronic kidney disease), stage III (HCCIngalls . COPD (chronic obstructive pulmonary disease) (HCCRoan[place] . Dilated aortic root (HCCAmasa . Discolored skin   . Diverticulosis   . Drug therapy    Redness and swelling with Avelox infusion May 24, 2011  . Dvt femoral (deep venous thrombosis) (HCC) <2002  . Dyspnea   . Eye abnormality    Ophthalmologist questions a clot in one of his eyes, May, 2012  . GERD (gastroesophageal reflux disease)   . Gout   . Gout    "on daily RX" (03/11/2018)  . Hyperlipidemia   . Hypertension   . Internal  hemorrhoids   . Ischemic cardiomyopathy   . Left atrial thrombus    Remote past... cardioversions done since that time  . Mitral regurgitation    Mild echo  . Myocardial infarction (HCCHaileyville 2002  . Nasal drainage    Chronic  . On home oxygen therapy    "2L prn; ~  4h/day" (02/26/2018)  . Overweight(278.02)   . Pericardial effusion   . Pleural effusion    Large loculated effusion on the left side November, 2011. This was tapped. It was exudative. Cytology revealed no cancer no proof of mesothelioma area pulmonary team felt that no further workup was needed  . Pleural thickening   . Pneumonia    "several times" (03/04/2018)  . Polycythemia vera (HCCLower Brule/11/2014  . Presence of permanent cardiac pacemaker   . SOB (shortness of breath)    Large left effusion/ thoracentesis/hospitalization/November, 2011... exudated.. cytology negative.. Dr.Wert.. no proof of mesothelioma  . Spinal stenosis    Surgery Dr.Elsner  . Thrombophlebitis of superficial veins of upper extremities    Possible venous stenosis from defibrillator  . Type II diabetes mellitus (HCCSt. Pete Beach008  . Venous insufficiency    Toe discoloration chronic  . Ventral hernia    April, 2014, result of his abdominal surgery  . Warfarin anticoagulation   . Wide-complex tachycardia (HCC[place]   Past Surgical History:  Procedure Laterality Date  . ABDOMINAL AORTIC ANEURYSM REPAIR  11/2002  . ANTERIOR CERVICAL DECOMP/DISCECTOMY FUSION  1995  . BACK SURGERY    . CARDIAC CATHETERIZATION  "several"  . CARDIAC CATHETERIZATION N/A 03/18/2016   Procedure: RIGHT/LEFT HEART CATH AND CORONARY/GRAFT ANGIOGRAPHY;  Surgeon: ChrNelva BushD;  Location: [facility];  Service: Cardiovascular;  Laterality: N/A;  .  CARDIAC CATHETERIZATION  02/23/2018  . CATARACT EXTRACTION W/ INTRAOCULAR LENS  IMPLANT, BILATERAL Bilateral   . COLONOSCOPY W/ POLYPECTOMY    . CORONARY ANGIOPLASTY WITH STENT PLACEMENT  2004  . CORONARY ARTERY BYPASS GRAFT  2004    CABG X4  . EPICARDIAL PACING LEAD PLACEMENT N/A 01/30/2018   Procedure: ATTEMPTED LV EPICARDIAL PACING LEAD PLACEMENT AND REVISION OF BiV PACING SYSTEM;  Surgeon: Gaye Pollack, MD;  Location: [facility];  Service: Thoracic;  Laterality: N/A;  . FRACTURE SURGERY    . IMPLANTABLE CARDIOVERTER DEFIBRILLATOR (ICD) GENERATOR CHANGE N/A 02/11/2013   Procedure: ICD GENERATOR CHANGE;  Surgeon: Evans Lance, MD;  Location: [facility] LAB;  Service: Cardiovascular;  Laterality: N/A;  . INCISION AND DRAINAGE ABSCESS / HEMATOMA OF BURSA / KNEE / THIGH    . INSERT / REPLACE / REMOVE PACEMAKER  2009   original pacer/defibrillator; Dr. Lovena Le 2009... by the pacing  . LUMBAR FUSION  2010   "4 fused areas; has steel rods in his back now" (03/06/2018)  . ORIF TIBIA & FIBULA FRACTURES Right 2000s  . PILONIDAL CYST EXCISION    . PRESSURE SENSOR/CARDIOMEMS  2018  . RIGHT HEART CATH N/A 10/19/2016   Procedure: Right Heart Cath;  Surgeon: [name], MD;  Location: [facility];  Service: Cardiovascular;  Laterality: N/A;  . RIGHT HEART CATH N/A 09/28/2017   Procedure: RIGHT HEART CATH;  Surgeon: [name], MD;  Location: [facility];  Service: Cardiovascular;  Laterality: N/A;  . RIGHT HEART CATH N/A 02/23/2018   Procedure: RIGHT HEART CATH;  Surgeon: [name], MD;  Location: [facility];  Service: Cardiovascular;  Laterality: N/A;  . SPINAL CORD STIMULATOR IMPLANT  12/2011  . SURGERY SCROTAL / TESTICULAR    . THORACOTOMY Left 01/30/2018   Procedure: LEFT THORACOTOMY;  Surgeon: Gaye Pollack, MD;  [facility];  Service: Thoracic;  Laterality: Left;    Family Hx:  Family History  Problem Relation Age of Onset  . Hypertension Mother   . Stroke Mother   . Diabetes Father   . Coronary artery disease Father   . Other Father        DVT  . Diabetes Brother   . Colon cancer Neg Hx   . Heart attack Neg Hx     Social History:  reports that he quit smoking about 22  years ago. His smoking use included cigarettes. He started smoking about 61 years ago. He has a 117.00 pack-year smoking history. He has never used smokeless tobacco. He reports current alcohol use of about 2.0 standard drinks of alcohol per week. He reports that he does not use drugs.  Allergies:  Allergies  Allergen Reactions  . Avelox [Moxifloxacin Hcl In Nacl] Swelling, Rash and Other (See Comments)    Patient became hypotensive after infusion started Because of a history of documented adverse serious drug reaction;Medi Alert bracelet  is recommended  . Other Rash and Swelling    Patient became hypotensive after infusion started Because of a history of documented adverse serious drug reaction;Medi Alert braceletis recommended  . Penicillins Anaphylaxis, Swelling and Other (See Comments)    Because of a history of documented adverse serious drug reaction;Medi Alert bracelet  is recommended Has patient had a PCN reaction causing immediate rash, facial/tongue/throat swelling, SOB or lightheadedness with hypotension: Yes Has patient had a PCN reaction causing severe rash involving mucus membranes or skin necrosis: No Has patient had  a PCN reaction that required hospitalization: Unknown Has patient had a PCN reaction occurring within the last 10 years: No  . Tape Other (See Comments)    "skin is thin; plastic tape rips skin off when removed; please use paper tape"/spouse    Medications: Prior to Admission medications   Medication Sig Start Date End Date Taking? Authorizing Provider  Acetaminophen 500 MG coapsule Take 1,000 mg by mouth every 6 (six) hours as needed for moderate pain.    Yes [provider]  allopurinol (ZYLOPRIM) 100 MG tablet TAKE TWO TABLETS BY MOUTH ONCE DAILY Patient taking differently: Take 200 mg by mouth every evening.  05/01/17  Yes Burns, Claudina Lick, MD  atorvastatin (LIPITOR) 10 MG tablet Take 1 tablet (10 mg total) by mouth daily. 10/09/17  Yes Burns, Claudina Lick, MD  carboxymethylcellulose (REFRESH PLUS) 0.5 % SOLN Place 2 drops into both eyes daily.    Yes [provider]  carvedilol (COREG) 12.5 MG tablet Take 1 tablet (12.5 mg total) by mouth 2 (two) times daily with a meal. 03/01/18  Yes [name], MD  cholecalciferol (VITAMIN D) 1000 units tablet Take 1,000 Units by mouth daily.   Yes [provider]  DULoxetine (CYMBALTA) 30 MG capsule TAKE 1 CAPSULE BY MOUTH ONCE DAILY Patient taking differently: Take 30 mg by mouth daily.  02/13/18  Yes Elayne Snare, MD  gabapentin (NEURONTIN) 300 MG capsule Take 2 capsules (600 mg total) by mouth 3 (three) times daily. TAKE ONE CAPSULE BY MOUTH 5 TIMES DAILY AS NEEDED FOR LEG PAIN Patient taking differently: Take 600 mg by mouth 3 (three) times daily.  11/30/16  Yes Burns, Claudina Lick, MD  hydrALAZINE (APRESOLINE) 25 MG tablet Take 1.5 tablets (37.5 mg total) by mouth every 8 (eight) hours. 02/28/18  Yes Georgiana Shore, NP  insulin NPH Human (NOVOLIN N) 100 UNIT/ML injection Inject 6-8 Units into the skin See admin instructions. Inject 8 units subcutaneously in the morning and inject 6 units subcutaneously in the evening.   Yes [provider]  insulin regular (NOVOLIN R) 100 units/mL injection Inject 6 Units into the skin daily before supper.    Yes [provider]  ipratropium (ATROVENT) 0.06 % nasal spray Place 2 sprays into both nostrils daily.  01/30/13  Yes [provider]  isosorbide mononitrate (IMDUR) 60 MG 24 hr tablet Take 1 tablet (60 mg total) by mouth daily. 03/01/18  Yes Georgiana Shore, NP  metolazone (ZAROXOLYN) 2.5 MG tablet Take 1 tablet (2.5 mg total) by mouth every other day. Patient taking differently: Take 2.5 mg by mouth once a week. Mondays 03/09/2018  Yes Georgiana Shore, NP  omeprazole (PRILOSEC) 20 MG capsule Take 2 capsules (40 mg total) by mouth 2 (two) times daily. Patient taking differently: Take 20 mg by mouth 2 (two) times daily.   03/01/17  Yes Evans Lance, MD  potassium chloride SA (K-DUR,KLOR-CON) 20 MEQ tablet Take 2 tablets (40 mEq total) by mouth daily. 12/29/17  Yes [name], MD  torsemide (DEMADEX) 100 MG tablet Take 1 tablet (100 mg total) by mouth 2 (two) times daily. 12/29/17  Yes [name], MD  warfarin (COUMADIN) 2.5 MG tablet Take 5 mg on Wednesday, 12/11. TAKE 2 TABLETS (5 MG) BY MOUTH ON TUESDAYS, THEN TAKE 1 TABLET (2.5 MG) BY MOUTH ON ALL OTHER DAYS. Patient taking differently: Take 2.5-5 mg by mouth daily at 6 PM. TAKE 2 TABLETS (5 MG) BY MOUTH  ON Wednesday AND SATURDAYS, THEN TAKE 1 TABLET (2.5 MG) BY MOUTH ON ALL OTHER DAYS. 02/28/18  Yes Georgiana Shore, NP  colchicine 0.6 MG tablet Take 0.6 mg by mouth daily as needed (gout flares).     [provider]  methocarbamol (ROBAXIN) 750 MG tablet Take 1 tablet (750 mg total) by mouth 2 (two) times daily as needed for muscle spasms. 08/02/17   [name], MD  metroNIDAZOLE (METROGEL) 0.75 % gel Apply 1 application topically daily as needed (rosacea).  06/28/17   [provider]  nitroGLYCERIN (NITROSTAT) 0.4 MG SL tablet Place 1 tablet (0.4 mg total) under the tongue every 5 (five) minutes as needed for chest pain (MAX 3 TABLETS). 12/15/14   Carlena Bjornstad, MD    I have reviewed the patient's current medications.  Labs:  Results for orders placed or performed during the hospital encounter of 03/04/2018 (from the past 48 hour(s))  Glucose, capillary     Status: Abnormal   Collection Time: 03/07/18 11:49 AM  Result Value Ref Range   Glucose-Capillary 125 (H) 70 - 99 mg/dL  Glucose, capillary     Status: Abnormal   Collection Time: 03/07/18  5:13 PM  Result Value Ref Range   Glucose-Capillary 156 (H) 70 - 99 mg/dL  Glucose, capillary     Status: Abnormal   Collection Time: 03/07/18  9:11 PM  Result Value Ref Range   Glucose-Capillary 132 (H) 70 - 99 mg/dL  Basic metabolic panel     Status: Abnormal   Collection Time:  03/08/18  3:31 AM  Result Value Ref Range   Sodium 129 (L) 135 - 145 mmol/L   Potassium 3.3 (L) 3.5 - 5.1 mmol/L   Chloride 86 (L) 98 - 111 mmol/L   CO2 26 22 - 32 mmol/L   Glucose, Bld 185 (H) 70 - 99 mg/dL   BUN 87 (H) 8 - 23 mg/dL   Creatinine, Ser 3.35 (H) 0.61 - 1.24 mg/dL   Calcium 8.5 (L) 8.9 - 10.3 mg/dL   GFR calc non Af Amer 17 (L) >60 mL/min   GFR calc Af Amer 20 (L) >60 mL/min   Anion gap 17 (H) 5 - 15    Comment: Performed at [facility], [address] [place], [place] [address]  Protime-INR     Status: Abnormal   Collection Time: 03/08/18  3:31 AM  Result Value Ref Range   Prothrombin Time 36.3 (H) 11.4 - 15.2 seconds   INR 3.72     Comment: Performed at [facility] [address], [place], [place] [address]  CBC     Status: Abnormal   Collection Time: 03/08/18  3:31 AM  Result Value Ref Range   WBC 6.3 4.0 - 10.5 K/uL   RBC 4.05 (L) 4.22 - 5.81 MIL/uL   Hemoglobin 12.0 (L) 13.0 - 17.0 g/dL   HCT 35.9 (L) 39.0 - 52.0 %   MCV 88.6 80.0 - 100.0 fL   MCH 29.6 26.0 - 34.0 pg   MCHC 33.4 30.0 - 36.0 g/dL   RDW 15.3 11.5 - 15.5 %   Platelets 196 150 - 400 K/uL   nRBC 0.0 0.0 - 0.2 %    Comment: Performed at [facility], [address], [place],  [address]  Hepatic function panel     Status: Abnormal   Collection Time: 03/08/18  3:31 AM  Result Value Ref Range   Total Protein 5.9 (L)  6.5 - 8.1 g/dL   Albumin 3.4 (L) 3.5 - 5.0 g/dL   AST 14 (L) 15 - 41 U/L   ALT 16 0 - 44 U/L   Alkaline Phosphatase 63 38 - 126 U/L   Total Bilirubin 1.7 (H) 0.3 - 1.2 mg/dL   Bilirubin, Direct 0.5 (H) 0.0 - 0.2 mg/dL   Indirect Bilirubin 1.2 (H) 0.3 - 0.9 mg/dL    Comment: Performed at [facility] [address], [place], [place] [address]  Cooxemetry Panel (carboxy, met, total hgb, O2 sat)     Status: Abnormal   Collection Time: 03/08/18  3:35 AM  Result Value Ref Range   Total hemoglobin 11.7 (L) 12.0 - 16.0 g/dL   O2 Saturation 86.2 %    Carboxyhemoglobin 1.7 (H) 0.5 - 1.5 %   Methemoglobin 1.8 (H) 0.0 - 1.5 %  Glucose, capillary     Status: Abnormal   Collection Time: 03/08/18  8:09 AM  Result Value Ref Range   Glucose-Capillary 181 (H) 70 - 99 mg/dL   Comment 1 Notify RN    Comment 2 Document in Chart   Glucose, capillary     Status: Abnormal   Collection Time: 03/08/18 11:30 AM  Result Value Ref Range   Glucose-Capillary 160 (H) 70 - 99 mg/dL   Comment 1 Notify RN    Comment 2 Document in Chart   Glucose, capillary     Status: Abnormal   Collection Time: 03/08/18  4:52 PM  Result Value Ref Range   Glucose-Capillary 123 (H) 70 - 99 mg/dL   Comment 1 Notify RN    Comment 2 Document in Chart   Glucose, capillary     Status: Abnormal   Collection Time: 03/08/18  9:52 PM  Result Value Ref Range   Glucose-Capillary 106 (H) 70 - 99 mg/dL   Comment 1 Notify RN    Comment 2 Document in Chart   Protime-INR     Status: Abnormal   Collection Time: 03/09/18  5:08 AM  Result Value Ref Range   Prothrombin Time 30.9 (H) 11.4 - 15.2 seconds   INR 3.03     Comment: Performed at [facility], [place]. [address] [place],  [address]  Basic metabolic panel     Status: Abnormal   Collection Time: 03/09/18  5:08 AM  Result Value Ref Range   Sodium 128 (L) 135 - 145 mmol/L   Potassium 2.8 (L) 3.5 - 5.1 mmol/L   Chloride 83 (L) 98 - 111 mmol/L   CO2 27 22 - 32 mmol/L   Glucose, Bld 168 (H) 70 - 99 mg/dL   BUN 85 (H) 8 - 23 mg/dL   Creatinine, Ser 3.53 (H) 0.61 - 1.24 mg/dL   Calcium 8.6 (L) 8.9 - 10.3 mg/dL   GFR calc non Af Amer 16 (L) >60 mL/min   GFR calc Af Amer 18 (L) >60 mL/min   Anion gap 18 (H) 5 - 15    Comment: Performed at [facility] [address] [place] [address]  CBC     Status: Abnormal   Collection Time: 03/09/18  5:08 AM  Result Value Ref Range   WBC 5.9 4.0 - 10.5 K/uL   RBC 3.83 (L) 4.22 - 5.81 MIL/uL   Hemoglobin 11.2 (L) 13.0 - 17.0 g/dL   HCT 34.0 (L) 39.0 - 52.0 %    MCV 88.8 80.0 - 100.0 fL   MCH 29.2 26.0 - 34.0  pg   MCHC 32.9 30.0 - 36.0 g/dL   RDW 15.3 11.5 - 15.5 %   Platelets 176 150 - 400 K/uL   nRBC 0.0 0.0 - 0.2 %    Comment: Performed at [facility], [place] [address], [place], [place] [address]  Magnesium     Status: None   Collection Time: 03/09/18  5:08 AM  Result Value Ref Range   Magnesium 2.4 1.7 - 2.4 mg/dL    Comment: Performed at [facility] [address] [place], [place] [address]  Glucose, capillary     Status: Abnormal   Collection Time: 03/09/18  7:42 AM  Result Value Ref Range   Glucose-Capillary 145 (H) 70 - 99 mg/dL  .Cooxemetry Panel (carboxy, met, total hgb, O2 sat)     Status: Abnormal   Collection Time: 03/09/18 10:20 AM  Result Value Ref Range   Total hemoglobin 12.1 12.0 - 16.0 g/dL   O2 Saturation 64.1 %   Carboxyhemoglobin 1.9 (H) 0.5 - 1.5 %   Methemoglobin 1.1 0.0 - 1.5 %   *Note: Due to a large number of results and/or encounters for the requested time period, some results have not been displayed. A complete set of results can be found in Results Review.     ROS:  A comprehensive review of systems was negative except for: Constitutional: positive for fatigue Cardiovascular: positive for irregular heart beat and lower extremity edema  Physical Exam: Vitals:   03/09/18 0739 03/09/18 0937  BP: (!) 100/54 119/74  Pulse: 83 (!) 109  Resp: 13   Temp: 98.2 F (36.8 C)   SpO2: 96%      General: Pale, fatigued appearing white male in no acute distress HEENT: Pupils are equal round reactive to light, extraocular motions are intact, mucous membranes are moist Neck: Positive for JVD Heart: Irregularly irregular Lungs: Decreased breath sounds at the bases Abdomen: Soft, nontender, got quite a bit of relief after Foley catheter placement Extremities: Pitting edema bilaterally Skin: Warm and dry Neuro: Fatigued but arousable and in no acute distress.  Neuro exam is nonfocal  Assessment/Plan:  76 year old white male with multiple medical issues including a significant cardiomyopathy.  He has baseline CKD but it has progressively worsened over the last several weeks in the setting of 3 different hospitalizations 1.Renal- acute on chronic renal failure.  My differential is ischemic related ATN secondary to cardiorenal syndrome versus bladder outlet obstruction or possibly a combination of both.  I hope that it is the bladder outlet obstruction that is contributing more because that would be easier thing to fix.  Status post Foley catheter with immediate return of over a liter of urine.  Renal ultrasound pending.  I will order a urinalysis as well.  As above I sincerely hope his renal function improves with only Foley catheter drainage.  I explained sometimes the difficulties of adding chronic dialysis into a complex medical life.  These discussions will continue.  No indications for dialysis at present 2. Hypertension/volume  -volume overloaded but also clinical bladder outlet obstruction.  Now that Foley catheter is in place I am going to hold diuretics as I feel he will auto diuresis 3.  Hypokalemia-quite low under 3 an issue for arrhythmias.  Received 40 this morning IV and 40 p.o.  Will re-dose 3 times daily and monitor frequently.  I suspect that it will go down with his auto diuresis 4. Anemia  -not a significant issue at this time 5.  Hyponatremia-a reflection of volume overload.  Suspect will improve with his auto diuresis   [name] 03/09/2018, 11:00 AM

## 2018-03-10 LAB — BASIC METABOLIC PANEL
Anion gap: 19 — ABNORMAL HIGH (ref 5–15)
BUN: 82 mg/dL — ABNORMAL HIGH (ref 8–23)
CO2: 26 mmol/L (ref 22–32)
Calcium: 8.3 mg/dL — ABNORMAL LOW (ref 8.9–10.3)
Chloride: 82 mmol/L — ABNORMAL LOW (ref 98–111)
Creatinine, Ser: 3.55 mg/dL — ABNORMAL HIGH (ref 0.61–1.24)
GFR calc Af Amer: 18 mL/min — ABNORMAL LOW (ref >60)
GFR calc non Af Amer: 16 mL/min — ABNORMAL LOW (ref >60)
GLUCOSE: 254 mg/dL — AB (ref 70–99)
Potassium: 3.8 mmol/L (ref 3.5–5.1)
Sodium: 127 mmol/L — ABNORMAL LOW (ref 135–145)

## 2018-03-10 LAB — CBC
HCT: 35.3 % — ABNORMAL LOW (ref 39.0–52.0)
Hemoglobin: 11.9 g/dL — ABNORMAL LOW (ref 13.0–17.0)
MCH: 30.5 pg (ref 26.0–34.0)
MCHC: 33.7 g/dL (ref 30.0–36.0)
MCV: 90.5 fL (ref 80.0–100.0)
Platelets: 172 10*3/uL (ref 150–400)
RBC: 3.9 MIL/uL — ABNORMAL LOW (ref 4.22–5.81)
RDW: 15.5 % (ref 11.5–15.5)
WBC: 7.8 10*3/uL (ref 4.0–10.5)
nRBC: 0 % (ref 0.0–0.2)

## 2018-03-10 LAB — URINALYSIS, ROUTINE W REFLEX MICROSCOPIC
Bilirubin Urine: NEGATIVE
Glucose, UA: NEGATIVE mg/dL
Ketones, ur: NEGATIVE mg/dL
Nitrite: NEGATIVE
Protein, ur: 30 mg/dL — AB
SPECIFIC GRAVITY, URINE: 1.015 (ref 1.005–1.030)
pH: 5.5 (ref 5.0–8.0)

## 2018-03-10 LAB — URINALYSIS, MICROSCOPIC (REFLEX)
RBC / HPF: >50 RBC/hpf (ref 0–5)
WBC, UA: >50 WBC/hpf (ref 0–5)

## 2018-03-10 LAB — RENAL FUNCTION PANEL
Albumin: 3.1 g/dL — ABNORMAL LOW (ref 3.5–5.0)
Anion gap: 20 — ABNORMAL HIGH (ref 5–15)
BUN: 87 mg/dL — ABNORMAL HIGH (ref 8–23)
CO2: 24 mmol/L (ref 22–32)
Calcium: 8.5 mg/dL — ABNORMAL LOW (ref 8.9–10.3)
Chloride: 80 mmol/L — ABNORMAL LOW (ref 98–111)
Creatinine, Ser: 3.66 mg/dL — ABNORMAL HIGH (ref 0.61–1.24)
GFR calc Af Amer: 18 mL/min — ABNORMAL LOW (ref >60)
GFR calc non Af Amer: 15 mL/min — ABNORMAL LOW (ref >60)
Glucose, Bld: 344 mg/dL — ABNORMAL HIGH (ref 70–99)
Phosphorus: 5 mg/dL — ABNORMAL HIGH (ref 2.5–4.6)
Potassium: 3.6 mmol/L (ref 3.5–5.1)
Sodium: 124 mmol/L — ABNORMAL LOW (ref 135–145)

## 2018-03-10 LAB — COOXEMETRY PANEL
Carboxyhemoglobin: 1.1 % (ref 0.5–1.5)
Methemoglobin: 1.6 % — ABNORMAL HIGH (ref 0.0–1.5)
O2 Saturation: 46 %
Total hemoglobin: 12.5 g/dL (ref 12.0–16.0)

## 2018-03-10 LAB — GLUCOSE, CAPILLARY
Glucose-Capillary: 163 mg/dL — ABNORMAL HIGH (ref 70–99)
Glucose-Capillary: 222 mg/dL — ABNORMAL HIGH (ref 70–99)
Glucose-Capillary: 161 mg/dL — ABNORMAL HIGH (ref 70–99)
Glucose-Capillary: 114 mg/dL — ABNORMAL HIGH (ref 70–99)

## 2018-03-10 LAB — PROTIME-INR
INR: 3.08
Prothrombin Time: 31.3 seconds — ABNORMAL HIGH (ref 11.4–15.2)

## 2018-03-10 MED ORDER — MIDODRINE HCL 5 MG PO TABS
10.0000 mg | ORAL_TABLET | Freq: Three times a day (TID) | ORAL | Status: DC
  Administered 2018-03-10 – 2018-03-14 (×12): 10 mg via ORAL
  Filled 2018-03-10 (×13): qty 2

## 2018-03-10 MED ORDER — LORAZEPAM 1 MG PO TABS
1.0000 mg | ORAL_TABLET | Freq: Once | ORAL | Status: AC
  Administered 2018-03-10: 1 mg via ORAL
  Filled 2018-03-10: qty 1

## 2018-03-10 MED ORDER — MILRINONE LACTATE IN DEXTROSE 20-5 MG/100ML-% IV SOLN
0.3750 ug/kg/min | INTRAVENOUS | Status: DC
  Administered 2018-03-10 – 2018-03-11 (×2): 0.125 ug/kg/min via INTRAVENOUS
  Administered 2018-03-12: 0.25 ug/kg/min via INTRAVENOUS
  Administered 2018-03-13 – 2018-03-14 (×2): 0.375 ug/kg/min via INTRAVENOUS
  Filled 2018-03-10 (×8): qty 100

## 2018-03-10 NOTE — Progress Notes (Signed)
Patient ID: [name], male   DOB: 03-18-1941, 77 y.o.   MRN: [id number]     Advanced Heart Failure Rounding Note  PCP-Cardiologist: No primary care provider on file.   Subjective:    Milrinone stopped yesterday due to NSVT. Co-ox 46%  Foley placed for urinary retention. Renal u/s with cortical atrophy but no hydro.   Weight up half a pound with poor response to lasix.   Feels terrible. Very weak. Lethargic. Poor appetite. Renal has started midodrine to help with maintaining BP  Creatinine remains at 3.6  Objective:   Weight Range: 97.7 kg Body mass index is 29.21 kg/m.   Vital Signs:   Temp:  [95.5 F (35.3 C)-97.5 F (36.4 C)] 97.4 F (36.3 C) (12/21 1213) Pulse Rate:  [41-104] 85 (12/21 1213) Resp:  [14-24] 17 (12/21 1213) BP: (70-133)/(46-88) 102/82 (12/21 1213) SpO2:  [94 %-100 %] 99 % (12/21 1213) Weight:  [97.7 kg] 97.7 kg (12/21 0500) Last BM Date: 03/09/18  Weight change: Filed Weights   03/08/18 0624 03/09/18 0311 03/10/18 0500  Weight: 97.5 kg 97.4 kg 97.7 kg   Intake/Output:   Intake/Output Summary (Last 24 hours) at 03/10/2018 1251 Last data filed at 03/10/2018 1218 Gross per 24 hour  Intake 590.17 ml  Output 1100 ml  Net -509.83 ml    Physical Exam    CVP 11-12 General:  Sitting in chair. Weak appearing. No resp difficulty HEENT: normal Neck: supple. JVP to jaw  Carotids 2+ bilat; no bruits. No lymphadenopathy or thryomegaly appreciated. Cor: PMI nondisplaced. Irregular rate & rhythm. No rubs, gallops or murmurs. Lungs: clear but decreased in right base Abdomen: soft, nontender, nondistended. No hepatosplenomegaly. No bruits or masses. Good bowel sounds. Extremities: no cyanosis, clubbing, rash, tr-1+ edema + ted hose Neuro: alert & orientedx3, cranial nerves grossly intact. moves all 4 extremities w/o difficulty. Affect pleasant   Telemetry   Afib 80-90s with very PVCs, personally reviewed.   EKG   No new tracings.    Labs      CBC Recent Labs    03/09/18 0508 03/10/18 0436  WBC 5.9 7.8  HGB 11.2* 11.9*  HCT 34.0* 35.3*  MCV 88.8 90.5  PLT 176 678   Basic Metabolic Panel Recent Labs    03/09/18 0508 03/09/18 1750 03/10/18 0436  NA 128* 129* 127*  K 2.8* 3.1* 3.8  CL 83* 85* 82*  CO2 27 30 26   GLUCOSE 168* 172* 254*  BUN 85* 84* 82*  CREATININE 3.53* 3.60* 3.55*  CALCIUM 8.6* 8.8* 8.3*  MG 2.4  --   --    Liver Function Tests Recent Labs    03/08/18 0331  AST 14*  ALT 16  ALKPHOS 63  BILITOT 1.7*  PROT 5.9*  ALBUMIN 3.4*   No results for input(s): LIPASE, AMYLASE in the last 72 hours. Cardiac Enzymes No results for input(s): CKTOTAL, CKMB, CKMBINDEX, TROPONINI in the last 72 hours.  BNP: BNP (last 3 results) Recent Labs    12/29/17 1048 02/22/18 1215  BNP 1,274.1* 2,025.5*    ProBNP (last 3 results) No results for input(s): PROBNP in the last 8760 hours.   D-Dimer No results for input(s): DDIMER in the last 72 hours. Hemoglobin A1C No results for input(s): HGBA1C in the last 72 hours. Fasting Lipid Panel No results for input(s): CHOL, HDL, LDLCALC, TRIG, CHOLHDL, LDLDIRECT in the last 72 hours. Thyroid Function Tests No results for input(s): TSH, T4TOTAL, T3FREE, THYROIDAB in the last 72 hours.  Invalid input(s): FREET3  Other results:   Imaging    US Renal  Result Date: 03/09/2018 CLINICAL DATA:  77 year old male with acute kidney injury. Stage 3 chronic kidney disease. EXAM: RENAL / URINARY TRACT ULTRASOUND COMPLETE COMPARISON:  CT lumbar myelogram 07/20/2017.  Chest CT 07/24/2017. FINDINGS: Right Kidney: Renal measurements: 11 x 5.9 x 5.1 centimeters = volume: 173 mL. Renal cortical thinning (image 8). No right hydronephrosis or renal mass. Left Kidney: Renal measurements: 11.6 x 6.4 x 7.6 centimeters = volume: 296 mL. Less renal cortical thinning compared to the right side (image 21). Normal cortical echogenicity. No left hydronephrosis or renal mass.  Bladder: Decompressed with what appears to be a Foley catheter balloon partially visible. Other findings: At least moderate size right pleural effusion (image 37). Smaller appearing left pleural effusion. IMPRESSION: 1. No acute renal findings. Right greater than left renal cortical atrophy. 2. Urinary bladder decompressed by a Foley catheter. 3. At least moderate size right pleural effusion. Small left pleural effusion. Electronically Signed   By: Genevie Ann M.D.   On: 03/09/2018 14:08     Medications:     Scheduled Medications: . allopurinol  200 mg Oral Daily  . atorvastatin  10 mg Oral Daily  . carvedilol  6.25 mg Oral BID WC  . cholecalciferol  1,000 Units Oral Daily  . DULoxetine  30 mg Oral Daily  . feeding supplement (ENSURE ENLIVE)  237 mL Oral BID BM  . gabapentin  600 mg Oral TID  . insulin aspart  0-15 Units Subcutaneous TID WC  . insulin aspart  0-5 Units Subcutaneous QHS  . insulin aspart  3 Units Subcutaneous TID WC  . ipratropium  2 spray Each Nare Daily  . mouth rinse  15 mL Mouth Rinse BID  . midodrine  10 mg Oral TID WC  . pantoprazole  40 mg Oral Daily  . potassium chloride  40 mEq Oral Once  . potassium chloride  40 mEq Oral TID  . sodium chloride flush  10-40 mL Intracatheter Q12H  . sodium chloride flush  3 mL Intravenous Q12H  . tamsulosin  0.4 mg Oral Daily  . traZODone  50 mg Oral QHS  . Warfarin - Pharmacist Dosing Inpatient   Does not apply q1800    Infusions: . sodium chloride Stopped (03/09/18 1540)  . amiodarone 30 mg/hr (03/10/18 0657)    PRN Medications: sodium chloride, acetaminophen, ondansetron (ZOFRAN) IV, polyvinyl alcohol, sodium chloride flush, sodium chloride flush    Patient Profile   [name] is a 77 y.o. male  with history of CAD s/p CABG, permanent atrial fib, S/P AAA repair, ischemic cardiomyopathy/chronic systolic heart failure,  [employer] CRT-D system.   Admitted from HF clinic 03/06/2018 with volume overload and AKI.    Assessment/Plan   1.Acute on chronic systolic CHF -> cardiogenic shock: Ischemic cardiomyopathy. 02/2018 Echo, EF 35-40%, stable. Medtronic CRT-D but LV lead turned off due to high threshold. Has a very wide QRS complex.  11/20109 Unable to place epicardial lead surgically. Patient was admitted earlier in 12/19 after [place] on 02/23/18 showed low output with CI 1.9 and elevated filling pressures. Creatinine was elevated to the 2.9 range.   He was started on milrinone and diuresed.  Milrinone weaned and he was sent home.  Admitted again with worsening SOB and recurrent worsening renal failure. Was on milrinone 0.25 with improved co-ox. Milrinone stopped 12/20 due to NSVT - Continues to struggle with NYHA III symptoms - Co-ox down to  46% off milrinone - CVP improved to 11-12 today but having sluggish response to diuretics. Creatinine 3.6 (unchanged with decompression) - Creatinine remains elevated despite decompression with Foley. Renal u/s - no hydro - Long talk with him and his wife about his situation. I told them he has end-stage HF with multi-system organ failure and survival rate likely 50% at 4-6 months. We discussed options of 1) continue medical therapy (which has been unsuccessful); 2) trial of home inotropes (with its incumbent risks) or 3) palliative care/hopsice. We also discussed Code status. He wants to be DNR but both he and his wife would like to do everything possible to try and maintain his quality and quantity of life and would be open to home milrinone. He is not VAD candidate with CKD. - Will restart milrinone at 0.125. Follow co-ox - stop carvedilol  Hopefully can rate control AF with amio alone - Will hold hydralazine/Imdur for now to keep BP up.   - No ACEI/ARB/ARNI/spironolactone with AKI.  2. Atrial fibrillation: Permanent  -Continue warfarin.   - As above. Stop carvedilol. Continue amio  3. CAD: s/p CABG.  No chest pain.   - No ASA given warfarin use.  - Continue  statin. Good lipids 4/19.  4.AKI onCKD Stage III: Progressive cardiorenal syndrome, creatinine up to 3.8 at readmission. Creatinine 3.8 -> 3.5 -> 3.43 -> 3.35 -> 3.53. Patient complained of urinary retention over the last day or so, bladder scan today with >900 cc.   - Creatinine remains elevated despite decompression with Foley. Renal u/s - no hydro - Appreciate Renal input. Midodrine ok.  - Will start milrinone 5. Hypokalemia:  - K 3.8 today.  6. PVCs: Frequent.  - Continue amiodarone  7. Deconditioning:  - Continue PT/OT.  8. NSVT - keep K> 4.0 mg > 2.0 - continue amio 9. DNR/DNI  - orders written  - will eventually consult Palliative Care for help  Length of Stay: [place] 03/10/2018 12:51 PM  Advanced Heart Failure Team Pager [phone] (M-F; 7a - 4p)  Please contact [facility] for night-coverage after hours (4p -7a ) and weekends on [email or web address]

## 2018-03-10 NOTE — Progress Notes (Signed)
Physical Therapy Treatment Patient Details Name: [name] MRN: [id number] DOB: 1940-09-04 Today's Date: 03/10/2018   See updated equipment recommendations below.          Equipment Recommendations  Other (comment);3in1 (PT)(Rollator)              Ellamae Sia, PT, DPT Acute Rehabilitation Services Pager [phone] Office [phone]    [name] 03/10/2018, 4:00 PM

## 2018-03-10 NOTE — Progress Notes (Signed)
   03/09/18 2100 03/09/18 2141 03/09/18 2148  Vitals  BP (!) 70/54 (!) 75/56 (!) 78/56  MAP (mmHg) (!) 61 (!) 63 (!) 64   On call Cards MD made aware. When pt is asleep BP is softer. When pt is awake BP is 98/69. MD advised RN to monitor pt and as long as pt mentation is not effected BP is okay. MD advised to continue amiodarone gtt as prescribed at this time. Will continue to monitor.

## 2018-03-10 NOTE — Progress Notes (Signed)
Subjective:  Low BPs overnight- as low as 70 systolic- had 2585 of UOP after foley (1500 neg for day) - renal function stable.  Renal ultrasound shows 11 cm kidneys with some cortical thinning - no obvious hydro    Objective Vital signs in last 24 hours: Vitals:   03/10/18 0300 03/10/18 0400 03/10/18 0435 03/10/18 0500  BP: (!) 95/55 (!) 88/55 90/76 113/80  Pulse: (!) 48 89 86 92  Resp: 17 (!) 23 (!) 24 (!) 21  Temp:      TempSrc:      SpO2: 97% 95% 94% 97%  Weight:    97.7 kg  Height:       Weight change: 0.3 kg  Intake/Output Summary (Last 24 hours) at 03/10/2018 0744 Last data filed at 03/10/2018 0600 Gross per 24 hour  Intake 590.17 ml  Output 2150 ml  Net -1559.83 ml    Assessment/Plan: 77 year old white male with multiple medical issues including a significant cardiomyopathy.  He has baseline CKD ( 1.4 -1.7) but it has progressively worsened over the last several weeks in the setting of 3 hospitalizations 1.Renal- acute on chronic renal failure.  My differential is ischemic related ATN secondary to cardiorenal syndrome versus bladder outlet obstruction or possibly a combination of both.  I hope that it is the bladder outlet obstruction that is contributing more because that would be easier thing to fix.  Status post Foley catheter with immediate return of over a liter of urine.   I have ordered a urinalysis as well- still pending.  As above I sincerely hope his renal function improves with only Foley catheter drainage.  I explained sometimes the difficulties of adding chronic dialysis into a complex medical life.  These discussions will continue.  No indications for dialysis at present.  I am a little disappointed that kidney function did not improve with foley- but it still could 2. Hypertension/volume  -volume overloaded but also clinical bladder outlet obstruction.  Now Foley catheter is in place I am going to hold diuretics as I feel he will auto diuresis- was 1500 neg last 24  hours, hypotension really precludes aggressive diuresis in my opinion.  The hypotension IS going to hamper renal recovery unfortunately and as a side note would make the prospect of dialysis difficult as well.  May I add midodrine ?? I ordered, cards may stop if they wish 3.  Hypokalemia-quite low initially, now betterr.  On 40 meq  3 times daily and monitor frequently.  I suspect that it may go down with his auto diuresis 4. Anemia  -not a significant issue at this time 5.  Hyponatremia-a reflection of volume overload.  Suspect will improve with his auto diuresis   Louis Meckel    Labs: Basic Metabolic Panel: Recent Labs  Lab 03/09/18 0508 03/09/18 1750 03/10/18 0436  NA 128* 129* 127*  K 2.8* 3.1* 3.8  CL 83* 85* 82*  CO2 27 30 26   GLUCOSE 168* 172* 254*  BUN 85* 84* 82*  CREATININE 3.53* 3.60* 3.55*  CALCIUM 8.6* 8.8* 8.3*   Liver Function Tests: Recent Labs  Lab 03/08/18 0331  AST 14*  ALT 16  ALKPHOS 63  BILITOT 1.7*  PROT 5.9*  ALBUMIN 3.4*   No results for input(s): LIPASE, AMYLASE in the last 168 hours. No results for input(s): AMMONIA in the last 168 hours. CBC: Recent Labs  Lab 03/06/18 0405 03/07/18 0315 03/08/18 0331 03/09/18 0508 03/10/18 0436  WBC 6.0 6.2 6.3 5.9 7.8  HGB 12.3* 12.3* 12.0* 11.2* 11.9*  HCT 37.8* 35.7* 35.9* 34.0* 35.3*  MCV 89.6 88.8 88.6 88.8 90.5  PLT 203 212 196 176 172   Cardiac Enzymes: No results for input(s): CKTOTAL, CKMB, CKMBINDEX, TROPONINI in the last 168 hours. CBG: Recent Labs  Lab 03/08/18 2152 03/09/18 0742 03/09/18 1200 03/09/18 1721 03/09/18 2203  GLUCAP 106* 145* 134* 167* 117*    Iron Studies: No results for input(s): IRON, TIBC, TRANSFERRIN, FERRITIN in the last 72 hours. Studies/Results: US Renal  Result Date: 03/09/2018 CLINICAL DATA:  77 year old male with acute kidney injury. Stage 3 chronic kidney disease. EXAM: RENAL / URINARY TRACT ULTRASOUND COMPLETE COMPARISON:  CT lumbar  myelogram 07/20/2017.  Chest CT 07/24/2017. FINDINGS: Right Kidney: Renal measurements: 11 x 5.9 x 5.1 centimeters = volume: 173 mL. Renal cortical thinning (image 8). No right hydronephrosis or renal mass. Left Kidney: Renal measurements: 11.6 x 6.4 x 7.6 centimeters = volume: 296 mL. Less renal cortical thinning compared to the right side (image 21). Normal cortical echogenicity. No left hydronephrosis or renal mass. Bladder: Decompressed with what appears to be a Foley catheter balloon partially visible. Other findings: At least moderate size right pleural effusion (image 37). Smaller appearing left pleural effusion. IMPRESSION: 1. No acute renal findings. Right greater than left renal cortical atrophy. 2. Urinary bladder decompressed by a Foley catheter. 3. At least moderate size right pleural effusion. Small left pleural effusion. Electronically Signed   By: Genevie Ann M.D.   On: 03/09/2018 14:08   Medications: Infusions: . sodium chloride Stopped (03/09/18 1540)  . amiodarone 30 mg/hr (03/10/18 0657)    Scheduled Medications: . allopurinol  200 mg Oral Daily  . atorvastatin  10 mg Oral Daily  . carvedilol  6.25 mg Oral BID WC  . cholecalciferol  1,000 Units Oral Daily  . DULoxetine  30 mg Oral Daily  . feeding supplement (ENSURE ENLIVE)  237 mL Oral BID BM  . gabapentin  600 mg Oral TID  . insulin aspart  0-15 Units Subcutaneous TID WC  . insulin aspart  0-5 Units Subcutaneous QHS  . insulin aspart  3 Units Subcutaneous TID WC  . ipratropium  2 spray Each Nare Daily  . mouth rinse  15 mL Mouth Rinse BID  . pantoprazole  40 mg Oral Daily  . potassium chloride  40 mEq Oral Once  . potassium chloride  40 mEq Oral TID  . sodium chloride flush  10-40 mL Intracatheter Q12H  . sodium chloride flush  3 mL Intravenous Q12H  . tamsulosin  0.4 mg Oral Daily  . traZODone  50 mg Oral QHS  . Warfarin - Pharmacist Dosing Inpatient   Does not apply q1800    have reviewed scheduled and prn  medications.  Physical Exam: General: sleeping- c/o not sleeping for 4 days so did not wake- wife not in room Heart: irreg on monitor Lungs: not examined Abdomen: not examined Extremities: not examined    03/10/2018,7:44 AM  LOS: 5 days

## 2018-03-10 NOTE — Progress Notes (Signed)
[name] for warfarin Indication: atrial fibrillation  Allergies  Allergen Reactions  . Avelox [Moxifloxacin Hcl In Nacl] Swelling, Rash and Other (See Comments)    Patient became hypotensive after infusion started Because of a history of documented adverse serious drug reaction;Medi Alert bracelet  is recommended  . Other Rash and Swelling    Patient became hypotensive after infusion started Because of a history of documented adverse serious drug reaction;Medi Alert braceletis recommended  . Penicillins Anaphylaxis, Swelling and Other (See Comments)    Because of a history of documented adverse serious drug reaction;Medi Alert bracelet  is recommended Has patient had a PCN reaction causing immediate rash, facial/tongue/throat swelling, SOB or lightheadedness with hypotension: Yes Has patient had a PCN reaction causing severe rash involving mucus membranes or skin necrosis: No Has patient had a PCN reaction that required hospitalization: Unknown Has patient had a PCN reaction occurring within the last 10 years: No  . Tape Other (See Comments)    "skin is thin; plastic tape rips skin off when removed; please use paper tape"/spouse    Patient Measurements: Height: 6' (182.9 cm) Weight: 215 lb 6.2 oz (97.7 kg) IBW/kg (Calculated) : 77.6  Vital Signs: Temp: 95.5 F (35.3 C) (12/21 0824) Temp Source: Axillary (12/21 0824) BP: 120/82 (12/21 0824) Pulse Rate: 104 (12/21 0824)  Labs: Recent Labs    03/08/18 0331 03/09/18 0508 03/09/18 1750 03/10/18 0436  HGB 12.0* 11.2*  --  11.9*  HCT 35.9* 34.0*  --  35.3*  PLT 196 176  --  172  LABPROT 36.3* 30.9*  --  31.3*  INR 3.72 3.03  --  3.08  CREATININE 3.35* 3.53* 3.60* 3.55*    Estimated Creatinine Clearance: 21.4 mL/min (A) (by C-G formula based on SCr of 3.55 mg/dL (H)).   Medical History: Past Medical History:  Diagnosis Date  . AAA (abdominal aortic aneurysm) ([place])    Surgical  repair 11/2002.  . Adenomatous colon polyp   . AICD (automatic cardioverter/defibrillator) present   . Alcohol ingestion of more than four drinks per week    Excess beer  not a dependency problem  . Aortic valve sclerosis   . Arthritis    "all over" (03/16/2018)  . Atrial fibrillation ([place])    Previous long-term amiodarone therapy with multiple cardioversions / amiodarone stopped September, 2009  . Atrial flutter [facility])    Started November, 2010, Left-sided and cannot ablate  . Bony abnormality    Patient's manubrium is slightly displaced to the right  . CAD (coronary artery disease)    a. s/p CABG 2004;  b. 02/2016 Cath: LM nl, LAD 70p, 136m, D1 50, D2 50, RI 50ost, LCX 22m, OM2 100, OM3 100, RCA 70p, 100d, VG->OM3 ok, LIMA->LAD 60ost, VG->RPDA  ok.  Marland Kitchen CAD (coronary artery disease)    Doppler, December, 2013, 0-39% bilateral  . 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 01/2013 MDT DTBA 1D1 [place] CRT-D, ser # [id number].  . Chronic back pain   . Chronic systolic CHF (congestive heart failure) ([place])    a. 02/2016 Echo: EF 35-40%, diff HK, triv AI, mildly dil Ao root 60mm, mild MR, sev dil LA, triv TR.  Marland Kitchen Chronotropic incompetence    IV pacing rate adjusted  . CKD (chronic kidney disease), stage III ([place])   . COPD (chronic obstructive pulmonary disease) ([place])   . Dilated aortic root ([name])   . Discolored skin   . Diverticulosis   .  Drug therapy    Redness and swelling with Avelox infusion May 24, 2011  . Dvt femoral (deep venous thrombosis) ([facility]) <2002  . Dyspnea   . Eye abnormality    Ophthalmologist questions a clot in one of his eyes, May, 2012  . GERD (gastroesophageal reflux disease)   . Gout   . Gout    "on daily RX" (03/03/2018)  . Hyperlipidemia   . Hypertension   . Internal hemorrhoids   . Ischemic cardiomyopathy   . Left atrial thrombus    Remote past... cardioversions done since that time  . Mitral regurgitation    Mild echo  .  Myocardial infarction ([place]) ~ 2002  . Nasal drainage    Chronic  . On home oxygen therapy    "2L prn; ~  4h/day" (03/06/2018)  . Overweight(278.02)   . Pericardial effusion   . Pleural effusion    Large loculated effusion on the left side November, 2011. This was tapped. It was exudative. Cytology revealed no cancer no proof of mesothelioma area pulmonary team felt that no further workup was needed  . Pleural thickening   . Pneumonia    "several times" (02/23/2018)  . Polycythemia vera ([place]) 07/28/2014  . Presence of permanent cardiac pacemaker   . SOB (shortness of breath)    Large left effusion/ thoracentesis/hospitalization/November, 2011... exudated.. cytology negative.. Dr.Wert.. no proof of mesothelioma  . Spinal stenosis    Surgery Dr.Elsner  . Thrombophlebitis of superficial veins of upper extremities    Possible venous stenosis from defibrillator  . Type II diabetes mellitus ([place]) 2008  . Venous insufficiency    Toe discoloration chronic  . Ventral hernia    April, 2014, result of his abdominal surgery  . Warfarin anticoagulation   . Wide-complex tachycardia (HCC)     Assessment: 74 yoM on warfarin PTA for hx of PAF. INR on admit was 4.7 and bumped to up above 5 despite holding warfarin.  Now trending down slowly to 3  (last dose 12/15 at home). CBC stable, LFTs wnl.  Will continue to hold warfarin and would consider starting heparin if INR drops below 2 until patient turns the corner and no procedures planned.    *PTA Warfarin Dose = 2.5mg  daily except 5mg  Tues  Goal of Therapy:  INR 2-3 Monitor platelets by anticoagulation protocol: Yes   Plan:  -Hold warfarin today -Monitor daily INR, CBC, s/sx bleeding -Discuss plan with MD of Heparin if INR <2 over weekend or restart warfarin   Erin Hearing PharmD., BCPS Clinical Pharmacist 03/10/2018 9:49 AM

## 2018-03-10 NOTE — Progress Notes (Signed)
Pt complaining of insomnia despite 50 mg of Trazodone given at 2220. Pt stated he has not slept in 4 days. MD paged. Verbal order received for 1 mg Ativan PO. Will continue to monitor.

## 2018-03-10 NOTE — Progress Notes (Signed)
PT Cancellation Note  Patient Details Name: [name] MRN: [id number] DOB: Jan 08, 1941   Cancelled Treatment:    Reason Eval/Treat Not Completed: Other (comment). Pt declining ambulation despite encouragement stating he just got back to bed.  Ellamae Sia, PT, DPT Acute Rehabilitation Services Pager [phone] Office [phone]    [name] 03/10/2018, 3:58 PM

## 2018-03-11 LAB — CBC
HCT: 36.4 % — ABNORMAL LOW (ref 39.0–52.0)
HEMOGLOBIN: 11.9 g/dL — AB (ref 13.0–17.0)
MCH: 29.1 pg (ref 26.0–34.0)
MCHC: 32.7 g/dL (ref 30.0–36.0)
MCV: 89 fL (ref 80.0–100.0)
Platelets: 158 10*3/uL (ref 150–400)
RBC: 4.09 MIL/uL — ABNORMAL LOW (ref 4.22–5.81)
RDW: 15.2 % (ref 11.5–15.5)
WBC: 12.9 10*3/uL — ABNORMAL HIGH (ref 4.0–10.5)
nRBC: 0 % (ref 0.0–0.2)

## 2018-03-11 LAB — BASIC METABOLIC PANEL
Anion gap: 19 — ABNORMAL HIGH (ref 5–15)
BUN: 91 mg/dL — ABNORMAL HIGH (ref 8–23)
CO2: 24 mmol/L (ref 22–32)
Calcium: 8.7 mg/dL — ABNORMAL LOW (ref 8.9–10.3)
Chloride: 81 mmol/L — ABNORMAL LOW (ref 98–111)
Creatinine, Ser: 3.89 mg/dL — ABNORMAL HIGH (ref 0.61–1.24)
GFR calc Af Amer: 16 mL/min — ABNORMAL LOW (ref >60)
GFR calc non Af Amer: 14 mL/min — ABNORMAL LOW (ref >60)
Glucose, Bld: 265 mg/dL — ABNORMAL HIGH (ref 70–99)
Potassium: 4.9 mmol/L (ref 3.5–5.1)
Sodium: 124 mmol/L — ABNORMAL LOW (ref 135–145)

## 2018-03-11 LAB — MAGNESIUM: Magnesium: 2.3 mg/dL (ref 1.7–2.4)

## 2018-03-11 LAB — GLUCOSE, CAPILLARY
GLUCOSE-CAPILLARY: 155 mg/dL — AB (ref 70–99)
Glucose-Capillary: 147 mg/dL — ABNORMAL HIGH (ref 70–99)
Glucose-Capillary: 163 mg/dL — ABNORMAL HIGH (ref 70–99)
Glucose-Capillary: 164 mg/dL — ABNORMAL HIGH (ref 70–99)

## 2018-03-11 LAB — COOXEMETRY PANEL
Carboxyhemoglobin: 1.1 % (ref 0.5–1.5)
Methemoglobin: 1.6 % — ABNORMAL HIGH (ref 0.0–1.5)
O2 Saturation: 53.8 %
TOTAL HEMOGLOBIN: 12.5 g/dL (ref 12.0–16.0)

## 2018-03-11 LAB — PROTIME-INR
INR: 3.22
Prothrombin Time: 32.4 seconds — ABNORMAL HIGH (ref 11.4–15.2)

## 2018-03-11 MED ORDER — CEFTRIAXONE SODIUM 1 G IJ SOLR
1.0000 g | INTRAMUSCULAR | Status: DC
  Filled 2018-03-11: qty 10

## 2018-03-11 MED ORDER — SODIUM CHLORIDE 0.9 % IV SOLN
1.0000 g | INTRAVENOUS | Status: DC
  Administered 2018-03-11 – 2018-03-12 (×2): 1 g via INTRAVENOUS
  Filled 2018-03-11 (×2): qty 10

## 2018-03-11 MED ORDER — LORAZEPAM 1 MG PO TABS
1.0000 mg | ORAL_TABLET | Freq: Once | ORAL | Status: AC
  Administered 2018-03-11: 1 mg via ORAL
  Filled 2018-03-11: qty 1

## 2018-03-11 MED ORDER — FUROSEMIDE 10 MG/ML IJ SOLN
40.0000 mg | Freq: Two times a day (BID) | INTRAMUSCULAR | Status: DC
  Administered 2018-03-11: 40 mg via INTRAVENOUS
  Filled 2018-03-11: qty 4

## 2018-03-11 MED ORDER — FOSFOMYCIN TROMETHAMINE 3 G PO PACK
3.0000 g | PACK | Freq: Once | ORAL | Status: DC
  Filled 2018-03-11: qty 3

## 2018-03-11 NOTE — Progress Notes (Signed)
Pt able to arouse -has some speech but falls asleep asleep right away when not stimulated  DR Bensimhon here to assess pt & spoke to family - pt still not safe to take any PO meds

## 2018-03-11 NOTE — Progress Notes (Addendum)
Upon head to toe assessment pt not alert - will grimace and withdraw to painful stimuli - unable to safely take any PO meds and or food or fluids PO. Call made to DR Moshe Cipro  to make aware @ 0900 - DR will change and or order meds IV see orders.

## 2018-03-11 NOTE — Progress Notes (Signed)
[place] for warfarin Indication: atrial fibrillation  Allergies  Allergen Reactions  . Avelox [Moxifloxacin Hcl In Nacl] Swelling, Rash and Other (See Comments)    Patient became hypotensive after infusion started Because of a history of documented adverse serious drug reaction;Medi Alert bracelet  is recommended  . Other Rash and Swelling    Patient became hypotensive after infusion started Because of a history of documented adverse serious drug reaction;Medi Alert braceletis recommended  . Penicillins Anaphylaxis, Swelling and Other (See Comments)    Because of a history of documented adverse serious drug reaction;Medi Alert bracelet  is recommended Has patient had a PCN reaction causing immediate rash, facial/tongue/throat swelling, SOB or lightheadedness with hypotension: Yes Has patient had a PCN reaction causing severe rash involving mucus membranes or skin necrosis: No Has patient had a PCN reaction that required hospitalization: Unknown Has patient had a PCN reaction occurring within the last 10 years: No  . Tape Other (See Comments)    "skin is thin; plastic tape rips skin off when removed; please use paper tape"/spouse    Patient Measurements: Height: 6' (182.9 cm) Weight: 217 lb 2.5 oz (98.5 kg) IBW/kg (Calculated) : 77.6  Vital Signs: Temp: 97.4 F (36.3 C) (12/22 0730) Temp Source: Axillary (12/22 0730) BP: 134/78 (12/22 0730) Pulse Rate: 95 (12/22 0300)  Labs: Recent Labs    03/09/18 0508  03/10/18 0436 03/10/18 1356 03/11/18 0312  HGB 11.2*  --  11.9*  --  11.9*  HCT 34.0*  --  35.3*  --  36.4*  PLT 176  --  172  --  158  LABPROT 30.9*  --  31.3*  --  32.4*  INR 3.03  --  3.08  --  3.22  CREATININE 3.53*   < > 3.55* 3.66* 3.89*   < > = values in this interval not displayed.    Estimated Creatinine Clearance: 19.7 mL/min (A) (by C-G formula based on SCr of 3.89 mg/dL (H)).   Medical History: Past Medical  History:  Diagnosis Date  . AAA (abdominal aortic aneurysm) ([place])    Surgical repair 11/2002.  . Adenomatous colon polyp   . AICD (automatic cardioverter/defibrillator) present   . Alcohol ingestion of more than four drinks per week    Excess beer  not a dependency problem  . Aortic valve sclerosis   . Arthritis    "all over" (02/19/2018)  . Atrial fibrillation ([place])    Previous long-term amiodarone therapy with multiple cardioversions / amiodarone stopped September, 2009  . Atrial flutter [facility])    Started November, 2010, Left-sided and cannot ablate  . Bony abnormality    Patient's manubrium is slightly displaced to the right  . CAD (coronary artery disease)    a. s/p CABG 2004;  b. 02/2016 Cath: LM nl, LAD 70p, 172m, D1 50, D2 50, RI 50ost, LCX 82m, OM2 100, OM3 100, RCA 70p, 100d, VG->OM3 ok, LIMA->LAD 60ost, VG->RPDA  ok.  Marland Kitchen CAD (coronary artery disease)    Doppler, December, 2013, 0-39% bilateral  . 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 01/2013 MDT DTBA 1D1 [place] CRT-D, ser # [id number].  . Chronic back pain   . Chronic systolic CHF (congestive heart failure) ([place])    a. 02/2016 Echo: EF 35-40%, diff HK, triv AI, mildly dil Ao root 27mm, mild MR, sev dil LA, triv TR.  Marland Kitchen Chronotropic incompetence    IV pacing rate adjusted  . CKD (chronic  kidney disease), stage III ([place])   . COPD (chronic obstructive pulmonary disease) ([place])   . Dilated aortic root ([place])   . Discolored skin   . Diverticulosis   . Drug therapy    Redness and swelling with Avelox infusion May 24, 2011  . Dvt femoral (deep venous thrombosis) (HCC) <2002  . Dyspnea   . Eye abnormality    Ophthalmologist questions a clot in one of his eyes, May, 2012  . GERD (gastroesophageal reflux disease)   . Gout   . Gout    "on daily RX" (03/01/2018)  . Hyperlipidemia   . Hypertension   . Internal hemorrhoids   . Ischemic cardiomyopathy   . Left atrial thrombus    Remote past...  cardioversions done since that time  . Mitral regurgitation    Mild echo  . Myocardial infarction ([place]) ~ 2002  . Nasal drainage    Chronic  . On home oxygen therapy    "2L prn; ~  4h/day" (03/15/2018)  . Overweight(278.02)   . Pericardial effusion   . Pleural effusion    Large loculated effusion on the left side November, 2011. This was tapped. It was exudative. Cytology revealed no cancer no proof of mesothelioma area pulmonary team felt that no further workup was needed  . Pleural thickening   . Pneumonia    "several times" (02/20/2018)  . 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 Presence of permanent cardiac pacemaker   . SOB (shortness of breath)    Large left effusion/ thoracentesis/hospitalization/November, 2011... exudated.. cytology negative.. Dr.Wert.. no proof of mesothelioma  . Spinal stenosis    Surgery Dr.Elsner  . Thrombophlebitis of superficial veins of upper extremities    Possible venous stenosis from defibrillator  . Type II diabetes mellitus ([place]) 2008  . Venous insufficiency    Toe discoloration chronic  . Ventral hernia    April, 2014, result of his abdominal surgery  . Warfarin anticoagulation   . Wide-complex tachycardia (HCC)     Assessment: 18 yoM on warfarin PTA for hx of PAF. INR on admit was 4.7 and bumped to up above 5 despite holding warfarin.   INR Now trending back up to 3.2. Will continue to hold dosing for now (last dose 12/15 at home). CBC stable, LFTs wnl.  Patient with likely uti, discussed options with renal earlier this morning. History of enterococcus in 2016 sensitive to amp, lvq, and vanc.   -At this time I recommended single dose of fosfomycin and follow progression. Looking through his allergies I believe he had an infusion reaction to avelox and likely not a true allergy, could consider po lvq(would need q48h dosing) if needed. He has also received zosyn in house and a 10 day course of ceftin as outpt so cephalosporins could be considered  also if needed and not enterococcus.   *PTA Warfarin Dose = 2.5mg  daily except 5mg  Tues  Goal of Therapy:  INR 2-3 Monitor platelets by anticoagulation protocol: Yes   Plan:  -Hold warfarin today -Monitor daily INR, CBC, s/sx bleeding -Give 1x dose of fosfomycin and follow progression/symptoms  Erin Hearing PharmD., BCPS Clinical Pharmacist 03/11/2018 8:43 AM

## 2018-03-11 NOTE — Progress Notes (Signed)
Subjective:   BPs overall better/higher but still had an 89 in there- had 875 of UOP last 24 hours- renal function slightly worse. Pt is more confused, son is here today  - understands seriousness of situation   Objective Vital signs in last 24 hours: Vitals:   03/10/18 1656 03/10/18 1930 03/10/18 2300 03/11/18 0300  BP: 94/72 93/75 107/78 120/68  Pulse: (!) 103 96 100 95  Resp: 15 17 15 16   Temp: (!) 97.3 F (36.3 C) 98.3 F (36.8 C) 97.8 F (36.6 C) (!) 97.5 F (36.4 C)  TempSrc: Oral Oral Oral Oral  SpO2: 96% 98% 99% 95%  Weight:    98.5 kg  Height:       Weight change: 0.8 kg  Intake/Output Summary (Last 24 hours) at 03/11/2018 0744 Last data filed at 03/11/2018 0354 Gross per 24 hour  Intake 446.87 ml  Output 875 ml  Net -428.13 ml    Assessment/Plan: 77 year old white male with multiple medical issues including a significant cardiomyopathy.  He has baseline CKD ( 1.4 -1.7) but it has progressively worsened over the last several weeks in the setting of 3 hospitalizations 1.Renal- acute on chronic renal failure.  Differential is ischemic related ATN secondary to cardiorenal syndrome versus bladder outlet obstruction or possibly a combination of both.  s/p Foley catheter with immediate return of over a liter of urine but hydro not noted on U/S.    urinalysis with greater than 50 reds and whites- will add culture and would like to cover empirically with something- has allergies, ask pharmacy.  I sincerely hoped his renal function would improve with only Foley catheter drainage. It has not but BP had been low as well, is now better.  No acute needs for HD and  I explained the difficulties of adding chronic dialysis into a complex medical life- that he really would not tolerate at this point.  It appears that cardiology has started these discussions about the future as well. We need to hope the milrinone/midodrine and tx for UTI will stabilize situation - if not- end of life could come  sooner 2. Hypertension/volume  -volume overloaded but also clinical bladder outlet obstruction.  Now Foley catheter is in place I held diuretics as I felt he will auto diuresis, hypotension really precludes aggressive diuresis in my opinion but now is better will add mod dose IV lasix to make him more comfortable.  The hypotension IS going to hamper renal recovery unfortunately and as a side note would make the prospect of dialysis difficult.  Added midodrine with some improvement - also on milrinone 3.  Hypokalemia-quite low initially, now better.  stop repletion for now 4. Anemia  -not a significant issue at this time 5.  Hyponatremia-a reflection of volume overload.  Suspect will improve if we are able to achieve diuresis 6. Confusion- stop neurontin   Louis Meckel    Labs: Basic Metabolic Panel: Recent Labs  Lab 03/10/18 0436 03/10/18 1356 03/11/18 0312  NA 127* 124* 124*  K 3.8 3.6 4.9  CL 82* 80* 81*  CO2 26 24 24   GLUCOSE 254* 344* 265*  BUN 82* 87* 91*  CREATININE 3.55* 3.66* 3.89*  CALCIUM 8.3* 8.5* 8.7*  PHOS  --  5.0*  --    Liver Function Tests: Recent Labs  Lab 03/08/18 0331 03/10/18 1356  AST 14*  --   ALT 16  --   ALKPHOS 63  --   BILITOT 1.7*  --   PROT  5.9*  --   ALBUMIN 3.4* 3.1*   No results for input(s): LIPASE, AMYLASE in the last 168 hours. No results for input(s): AMMONIA in the last 168 hours. CBC: Recent Labs  Lab 03/07/18 0315 03/08/18 0331 03/09/18 0508 03/10/18 0436 03/11/18 0312  WBC 6.2 6.3 5.9 7.8 12.9*  HGB 12.3* 12.0* 11.2* 11.9* 11.9*  HCT 35.7* 35.9* 34.0* 35.3* 36.4*  MCV 88.8 88.6 88.8 90.5 89.0  PLT 212 196 176 172 158   Cardiac Enzymes: No results for input(s): CKTOTAL, CKMB, CKMBINDEX, TROPONINI in the last 168 hours. CBG: Recent Labs  Lab 03/09/18 2203 03/10/18 0818 03/10/18 1212 03/10/18 1653 03/10/18 2137  GLUCAP 117* 163* 222* 161* 114*    Iron Studies: No results for input(s): IRON, TIBC,  TRANSFERRIN, FERRITIN in the last 72 hours. Studies/Results: US Renal  Result Date: 03/09/2018 CLINICAL DATA:  77 year old male with acute kidney injury. Stage 3 chronic kidney disease. EXAM: RENAL / URINARY TRACT ULTRASOUND COMPLETE COMPARISON:  CT lumbar myelogram 07/20/2017.  Chest CT 07/24/2017. FINDINGS: Right Kidney: Renal measurements: 11 x 5.9 x 5.1 centimeters = volume: 173 mL. Renal cortical thinning (image 8). No right hydronephrosis or renal mass. Left Kidney: Renal measurements: 11.6 x 6.4 x 7.6 centimeters = volume: 296 mL. Less renal cortical thinning compared to the right side (image 21). Normal cortical echogenicity. No left hydronephrosis or renal mass. Bladder: Decompressed with what appears to be a Foley catheter balloon partially visible. Other findings: At least moderate size right pleural effusion (image 37). Smaller appearing left pleural effusion. IMPRESSION: 1. No acute renal findings. Right greater than left renal cortical atrophy. 2. Urinary bladder decompressed by a Foley catheter. 3. At least moderate size right pleural effusion. Small left pleural effusion. Electronically Signed   By: Genevie Ann M.D.   On: 03/09/2018 14:08   Medications: Infusions: . sodium chloride Stopped (03/09/18 1540)  . amiodarone 30 mg/hr (03/11/18 0620)  . milrinone 0.125 mcg/kg/min (03/11/18 0618)    Scheduled Medications: . allopurinol  200 mg Oral Daily  . atorvastatin  10 mg Oral Daily  . cholecalciferol  1,000 Units Oral Daily  . DULoxetine  30 mg Oral Daily  . feeding supplement (ENSURE ENLIVE)  237 mL Oral BID BM  . gabapentin  600 mg Oral TID  . insulin aspart  0-15 Units Subcutaneous TID WC  . insulin aspart  0-5 Units Subcutaneous QHS  . insulin aspart  3 Units Subcutaneous TID WC  . ipratropium  2 spray Each Nare Daily  . mouth rinse  15 mL Mouth Rinse BID  . midodrine  10 mg Oral TID WC  . pantoprazole  40 mg Oral Daily  . potassium chloride  40 mEq Oral Once  . potassium  chloride  40 mEq Oral TID  . sodium chloride flush  10-40 mL Intracatheter Q12H  . sodium chloride flush  3 mL Intravenous Q12H  . tamsulosin  0.4 mg Oral Daily  . traZODone  50 mg Oral QHS  . Warfarin - Pharmacist Dosing Inpatient   Does not apply q1800    have reviewed scheduled and prn medications.  Physical Exam: General: sleeping-  arousable and more confused today  Heart: irreg on monitor Lungs: dec BS at bases  Abdomen: distended Extremities: pitting edema    03/11/2018,7:44 AM  LOS: 6 days

## 2018-03-11 NOTE — Progress Notes (Addendum)
Patient ID: [name], male   DOB: 1940-04-13, 77 y.o.   MRN: [id number]     Advanced Heart Failure Rounding Note  PCP-Cardiologist: No primary care provider on file.   Subjective:    Milrinone restarted yesterday for low co-ox (46%). Co-ox 54% today on milrinone 0.125  I had long talk with him and his family yesterday about prognosis. Made DNR/DNI  Very lethargic this am but starting to wake up. Creatinine up to 3.9. BUN 91   Started on IV abx for UA this am (has not received yet). UCx pending.   Objective:   Weight Range: 98.5 kg Body mass index is 29.45 kg/m.   Vital Signs:   Temp:  [97.3 F (36.3 C)-98.3 F (36.8 C)] 97.4 F (36.3 C) (12/22 0730) Pulse Rate:  [85-103] 86 (12/22 0900) Resp:  [14-18] 15 (12/22 0900) BP: (93-134)/(68-85) 115/77 (12/22 0900) SpO2:  [91 %-99 %] 91 % (12/22 0900) Weight:  [98.5 kg] 98.5 kg (12/22 0300) Last BM Date: 03/10/18  Weight change: Filed Weights   03/09/18 0311 03/10/18 0500 03/11/18 0300  Weight: 97.4 kg 97.7 kg 98.5 kg   Intake/Output:   Intake/Output Summary (Last 24 hours) at 03/11/2018 1046 Last data filed at 03/11/2018 0354 Gross per 24 hour  Intake 446.87 ml  Output 875 ml  Net -428.13 ml    Physical Exam    General:  Sitting up in bed. Lethargic initially but beginning to wake up and speak. No respiratory difficulty HEENT: normal Neck: supple. JVP to jaw . Carotids 2+ bilat; no bruits. No lymphadenopathy or thryomegaly appreciated. Cor: PMI nondisplaced. Irregular rate & rhythm. No rubs, gallops or murmurs. Lungs: clear Abdomen: soft, nontender, nondistended. No hepatosplenomegaly. No bruits or masses. Good bowel sounds. Extremities: no cyanosis, clubbing, rash, edema Neuro: lethargic but arousable and talking but confused at times    Telemetry   Afib 90s with PVCs, personally reviewed.   EKG   No new tracings.    Labs    CBC Recent Labs    03/10/18 0436 03/11/18 0312  WBC 7.8 12.9*    HGB 11.9* 11.9*  HCT 35.3* 36.4*  MCV 90.5 89.0  PLT 172 008   Basic Metabolic Panel Recent Labs    03/09/18 0508  03/10/18 1356 03/11/18 0312  NA 128*   < > 124* 124*  K 2.8*   < > 3.6 4.9  CL 83*   < > 80* 81*  CO2 27   < > 24 24  GLUCOSE 168*   < > 344* 265*  BUN 85*   < > 87* 91*  CREATININE 3.53*   < > 3.66* 3.89*  CALCIUM 8.6*   < > 8.5* 8.7*  MG 2.4  --   --  2.3  PHOS  --   --  5.0*  --    < > = values in this interval not displayed.   Liver Function Tests Recent Labs    03/10/18 1356  ALBUMIN 3.1*   No results for input(s): LIPASE, AMYLASE in the last 72 hours. Cardiac Enzymes No results for input(s): CKTOTAL, CKMB, CKMBINDEX, TROPONINI in the last 72 hours.  BNP: BNP (last 3 results) Recent Labs    12/29/17 1048 02/22/18 1215  BNP 1,274.1* 2,025.5*    ProBNP (last 3 results) No results for input(s): PROBNP in the last 8760 hours.   D-Dimer No results for input(s): DDIMER in the last 72 hours. Hemoglobin A1C No results for input(s): HGBA1C in the last  72 hours. Fasting Lipid Panel No results for input(s): CHOL, HDL, LDLCALC, TRIG, CHOLHDL, LDLDIRECT in the last 72 hours. Thyroid Function Tests No results for input(s): TSH, T4TOTAL, T3FREE, THYROIDAB in the last 72 hours.  Invalid input(s): FREET3  Other results:   Imaging    No results found.   Medications:     Scheduled Medications: . allopurinol  200 mg Oral Daily  . atorvastatin  10 mg Oral Daily  . cefTRIAXone (ROCEPHIN) IM  1 g Intramuscular Q24H  . cholecalciferol  1,000 Units Oral Daily  . DULoxetine  30 mg Oral Daily  . feeding supplement (ENSURE ENLIVE)  237 mL Oral BID BM  . furosemide  40 mg Intravenous Q12H  . insulin aspart  0-15 Units Subcutaneous TID WC  . insulin aspart  0-5 Units Subcutaneous QHS  . insulin aspart  3 Units Subcutaneous TID WC  . ipratropium  2 spray Each Nare Daily  . mouth rinse  15 mL Mouth Rinse BID  . midodrine  10 mg Oral TID WC  .  pantoprazole  40 mg Oral Daily  . potassium chloride  40 mEq Oral Once  . sodium chloride flush  10-40 mL Intracatheter Q12H  . sodium chloride flush  3 mL Intravenous Q12H  . traZODone  50 mg Oral QHS  . Warfarin - Pharmacist Dosing Inpatient   Does not apply q1800    Infusions: . sodium chloride Stopped (03/09/18 1540)  . amiodarone 30 mg/hr (03/11/18 0620)  . milrinone 0.125 mcg/kg/min (03/11/18 0618)    PRN Medications: sodium chloride, acetaminophen, ondansetron (ZOFRAN) IV, polyvinyl alcohol, sodium chloride flush, sodium chloride flush    Patient Profile   [name] is a 77 y.o. male  with history of CAD s/p CABG, permanent atrial fib, S/P AAA repair, ischemic cardiomyopathy/chronic systolic heart failure,  [employer] CRT-D system.   Admitted from HF clinic 03/07/2018 with volume overload and AKI.   Assessment/Plan   1.Acute on chronic systolic CHF -> cardiogenic shock: Ischemic cardiomyopathy. 02/2018 Echo, EF 35-40%, stable. [employer] CRT-D but LV lead turned off due to high threshold. Has a very wide QRS complex.  11/20109 Unable to place epicardial lead surgically. Patient was admitted earlier in 12/19 after [name] on 02/23/18 showed low output with CI 1.9 and elevated filling pressures. Creatinine was elevated to the 2.9 range.   He was started on milrinone and diuresed.  Milrinone weaned and he was sent home.  Admitted again with worsening SOB and recurrent worsening renal failure. Was on milrinone 0.25 with improved co-ox. Milrinone stopped 12/20 due to NSVT - He has end-stage HF with cardio-renal syndrome. Now DNR - Co-ox up to 53% on milrinone 0.125. Will not increase currently with possible developing sepsis. - CVP unchanged at 12-13 today. Renal function worse with rising creatinine. Will hold lasix for now with possible sepsis/UTI - Long talk with his wife and son this am  about his situation. I reiterated that he has end-stage HF with multi-system organ failure  and survival rate likely 50% at 4-6 months. Situation now complicated by UTI and worsening AKI +/-uremia.  We discussed options of 1) continue medical therapy (which has been unsuccessful); 2) trial of home inotropes (with its incumbent risks) or 3) palliative care/hopsice. We also discussed Code status. He wants to be DNR but both he and his wife would like to do everything possible to try and maintain his quality and quantity of life and would be open to home milrinone. He is not  VAD candidate with CKD. - Off carvedilol carvedilol and hydralazine/Imdur due to low BP - No ACEI/ARB/ARNI/spironolactone with AKI.  2. Obtundation - likely multifactorial with sundowning, advanced HF, UTI, uremia, etc. But in looking back thru his MAR also looks like he got ativan at 3a todaya nd I suspect this is major issue.  - switch meds to IV - treat infection - continue supportive care 3. Atrial fibrillation: Permanent  -Continue warfarin.   - Rate controlled on IV  amio  4. CAD: s/p CABG.  No chest pain.   - No ASA given warfarin use.  - Continue statin. Good lipids 4/19.  5.AKI onCKD Stage III: Progressive cardiorenal syndrome, creatinine up to 3.8 at readmission. Creatinine 3.8 -> 3.5 -> 3.43 -> 3.35 -> 3.53. Patient complained of urinary retention over the last day or so, bladder scan today with >900 cc.   - Creatinine remains elevated despite decompression with Foley. Renal u/s - no hydro - Renal function worsening in setting of cardio-renal syndrome. - Appreciate Renal input. Not candidate for HD 6. Hypokalemia:  - K 4.9 today. No further K supplement  7. PVCs: Frequent.  - Continue amiodarone  8. Deconditioning:  - Continue PT/OT.  9. NSVT - keep K> 4.0 mg > 2.0 - continue amio 10. UTI - start rocephin - await UCX. Check Bcx.  11. DNR/DNI  - orders written  - may not make it out of hospital   CRITICAL CARE Performed by: Glori Bickers  Total critical care time: 45  minutes  Critical care time was exclusive of separately billable procedures and treating other patients.  Critical care was necessary to treat or prevent imminent or life-threatening deterioration.  Critical care was time spent personally by me (independent of midlevel providers or residents) on the following activities: development of treatment plan with patient and/or surrogate as well as nursing, discussions with consultants, evaluation of patient's response to treatment, examination of patient, obtaining history from patient or surrogate, ordering and performing treatments and interventions, ordering and review of laboratory studies, ordering and review of radiographic studies, pulse oximetry and re-evaluation of patient's condition.    Length of Stay: [place] 03/11/2018 10:46 AM  Advanced Heart Failure Team Pager [phone] (M-F; 7a - 4p)  Please contact [facility] for night-coverage after hours (4p -7a ) and weekends on [email or web address]

## 2018-03-12 ENCOUNTER — Encounter (HOSPITAL_COMMUNITY): Payer: Medicare Other

## 2018-03-12 DIAGNOSIS — R570 Cardiogenic shock: Secondary | ICD-10-CM

## 2018-03-12 LAB — CBC
HCT: 36.3 % — ABNORMAL LOW (ref 39.0–52.0)
Hemoglobin: 12.3 g/dL — ABNORMAL LOW (ref 13.0–17.0)
MCH: 30.3 pg (ref 26.0–34.0)
MCHC: 33.9 g/dL (ref 30.0–36.0)
MCV: 89.4 fL (ref 80.0–100.0)
Platelets: 153 10*3/uL (ref 150–400)
RBC: 4.06 MIL/uL — ABNORMAL LOW (ref 4.22–5.81)
RDW: 15.2 % (ref 11.5–15.5)
WBC: 12.9 10*3/uL — ABNORMAL HIGH (ref 4.0–10.5)
nRBC: 0 % (ref 0.0–0.2)

## 2018-03-12 LAB — BASIC METABOLIC PANEL
Anion gap: 22 — ABNORMAL HIGH (ref 5–15)
BUN: 95 mg/dL — ABNORMAL HIGH (ref 8–23)
CO2: 22 mmol/L (ref 22–32)
Calcium: 8.5 mg/dL — ABNORMAL LOW (ref 8.9–10.3)
Chloride: 80 mmol/L — ABNORMAL LOW (ref 98–111)
Creatinine, Ser: 4.11 mg/dL — ABNORMAL HIGH (ref 0.61–1.24)
GFR calc Af Amer: 15 mL/min — ABNORMAL LOW (ref >60)
GFR calc non Af Amer: 13 mL/min — ABNORMAL LOW (ref >60)
GLUCOSE: 283 mg/dL — AB (ref 70–99)
Potassium: 4.5 mmol/L (ref 3.5–5.1)
Sodium: 124 mmol/L — ABNORMAL LOW (ref 135–145)

## 2018-03-12 LAB — GLUCOSE, CAPILLARY
Glucose-Capillary: 183 mg/dL — ABNORMAL HIGH (ref 70–99)
Glucose-Capillary: 165 mg/dL — ABNORMAL HIGH (ref 70–99)
Glucose-Capillary: 122 mg/dL — ABNORMAL HIGH (ref 70–99)
Glucose-Capillary: 148 mg/dL — ABNORMAL HIGH (ref 70–99)

## 2018-03-12 LAB — COOXEMETRY PANEL
Carboxyhemoglobin: 1.4 % (ref 0.5–1.5)
Carboxyhemoglobin: 1.1 % (ref 0.5–1.5)
Carboxyhemoglobin: 1.4 % (ref 0.5–1.5)
METHEMOGLOBIN: 1 % (ref 0.0–1.5)
Methemoglobin: 1.5 % (ref 0.0–1.5)
Methemoglobin: 1.1 % (ref 0.0–1.5)
O2 Saturation: 47.8 %
O2 Saturation: 46.5 %
O2 Saturation: 50 %
Total hemoglobin: 12.7 g/dL (ref 12.0–16.0)
Total hemoglobin: 12.1 g/dL (ref 12.0–16.0)
Total hemoglobin: 12.9 g/dL (ref 12.0–16.0)

## 2018-03-12 LAB — PROTIME-INR
INR: 2.84
PROTHROMBIN TIME: 29.4 s — AB (ref 11.4–15.2)

## 2018-03-12 MED ORDER — FUROSEMIDE 10 MG/ML IJ SOLN
120.0000 mg | Freq: Two times a day (BID) | INTRAVENOUS | Status: DC
  Administered 2018-03-12 – 2018-03-13 (×3): 120 mg via INTRAVENOUS
  Filled 2018-03-12 (×2): qty 12
  Filled 2018-03-12 (×2): qty 2

## 2018-03-12 MED ORDER — MORPHINE SULFATE (PF) 2 MG/ML IV SOLN
1.0000 mg | Freq: Four times a day (QID) | INTRAVENOUS | Status: DC | PRN
  Administered 2018-03-12 – 2018-03-14 (×4): 1 mg via INTRAVENOUS
  Filled 2018-03-12 (×4): qty 1

## 2018-03-12 NOTE — Plan of Care (Signed)
  Problem: Health Behavior/Discharge Planning: Goal: Ability to manage health-related needs will improve Outcome: Progressing   Problem: Clinical Measurements: Goal: Ability to maintain clinical measurements within normal limits will improve Outcome: Progressing Goal: Will remain free from infection Outcome: Progressing Goal: Diagnostic test results will improve Outcome: Progressing Goal: Respiratory complications will improve Outcome: Progressing Goal: Cardiovascular complication will be avoided Outcome: Progressing   Problem: Activity: Goal: Risk for activity intolerance will decrease Outcome: Progressing   Problem: Nutrition: Goal: Adequate nutrition will be maintained Outcome: Progressing   Problem: Coping: Goal: Level of anxiety will decrease Outcome: Progressing   Problem: Elimination: Goal: Will not experience complications related to bowel motility Outcome: Progressing Goal: Will not experience complications related to urinary retention Outcome: Progressing   Problem: Pain Managment: Goal: General experience of comfort will improve Outcome: Progressing   Problem: Safety: Goal: Ability to remain free from injury will improve Outcome: Progressing   Problem: Skin Integrity: Goal: Risk for impaired skin integrity will decrease Outcome: Progressing   Problem: Education: Goal: Ability to demonstrate management of disease process will improve Outcome: Progressing Goal: Ability to verbalize understanding of medication therapies will improve Outcome: Progressing   Problem: Activity: Goal: Capacity to carry out activities will improve Outcome: Progressing   Problem: Cardiac: Goal: Ability to achieve and maintain adequate cardiopulmonary perfusion will improve Outcome: Progressing   

## 2018-03-12 NOTE — Progress Notes (Signed)
[name] for warfarin Indication: atrial fibrillation  Allergies  Allergen Reactions  . Avelox [Moxifloxacin Hcl In Nacl] Swelling, Rash and Other (See Comments)    Patient became hypotensive after infusion started Because of a history of documented adverse serious drug reaction;Medi Alert bracelet  is recommended  . Other Rash and Swelling    Patient became hypotensive after infusion started Because of a history of documented adverse serious drug reaction;Medi Alert braceletis recommended  . Penicillins Anaphylaxis, Swelling and Other (See Comments)    Tolerated Ceftriaxone 12/22 / ceftin as an oupt Because of a history of documented adverse serious drug reaction;Medi Alert bracelet  is recommended Has patient had a PCN reaction causing immediate rash, facial/tongue/throat swelling, SOB or lightheadedness with hypotension: Yes Has patient had a PCN reaction causing severe rash involving mucus membranes or skin necrosis: No Has patient had a PCN reaction that required hospitalization: Unknown Has patient had a PCN reaction occurring within the las  . Tape Other (See Comments)    "skin is thin; plastic tape rips skin off when removed; please use paper tape"/spouse    Patient Measurements: Height: 6' (182.9 cm) Weight: 223 lb 12.3 oz (101.5 kg) IBW/kg (Calculated) : 77.6  Vital Signs: Temp: 98.2 F (36.8 C) (12/23 1213) Temp Source: Oral (12/23 1213) BP: 119/76 (12/23 1213) Pulse Rate: 101 (12/23 1213)  Labs: Recent Labs    03/10/18 0436 03/10/18 1356 03/11/18 0312 03/12/18 0444  HGB 11.9*  --  11.9* 12.3*  HCT 35.3*  --  36.4* 36.3*  PLT 172  --  158 153  LABPROT 31.3*  --  32.4* 29.4*  INR 3.08  --  3.22 2.84  CREATININE 3.55* 3.66* 3.89* 4.11*    Estimated Creatinine Clearance: 18.9 mL/min (A) (by C-G formula based on SCr of 4.11 mg/dL (H)).   Medical History: Past Medical History:  Diagnosis Date  . AAA (abdominal  aortic aneurysm) ([place])    Surgical repair 11/2002.  . Adenomatous colon polyp   . AICD (automatic cardioverter/defibrillator) present   . Alcohol ingestion of more than four drinks per week    Excess beer  not a dependency problem  . Aortic valve sclerosis   . Arthritis    "all over" (03/01/2018)  . Atrial fibrillation ([place])    Previous long-term amiodarone therapy with multiple cardioversions / amiodarone stopped September, 2009  . Atrial flutter [facility])    Started November, 2010, Left-sided and cannot ablate  . Bony abnormality    Patient's manubrium is slightly displaced to the right  . CAD (coronary artery disease)    a. s/p CABG 2004;  b. 02/2016 Cath: LM nl, LAD 70p, 161m, D1 50, D2 50, RI 50ost, LCX 92m, OM2 100, OM3 100, RCA 70p, 100d, VG->OM3 ok, LIMA->LAD 60ost, VG->RPDA  ok.  Marland Kitchen CAD (coronary artery disease)    Doppler, December, 2013, 0-39% bilateral  . 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 01/2013 MDT DTBA 1D1 [place] CRT-D, ser # [id number].  . Chronic back pain   . Chronic systolic CHF (congestive heart failure) ([place])    a. 02/2016 Echo: EF 35-40%, diff HK, triv AI, mildly dil Ao root 44mm, mild MR, sev dil LA, triv TR.  Marland Kitchen Chronotropic incompetence    IV pacing rate adjusted  . CKD (chronic kidney disease), stage III ([place])   . COPD (chronic obstructive pulmonary disease) ([place])   . Dilated aortic root ([place])   . Discolored skin   .  Diverticulosis   . Drug therapy    Redness and swelling with Avelox infusion May 24, 2011  . Dvt femoral (deep venous thrombosis) (H[facility]) <2002  . Dyspnea   . Eye abnormality    Ophthalmologist questions a clot in one of his eyes, May, 2012  . GERD (gastroesophageal reflux disease)   . Gout   . Gout    "on daily RX" (02/27/2018)  . Hyperlipidemia   . Hypertension   . Internal hemorrhoids   . Ischemic cardiomyopathy   . Left atrial thrombus    Remote past... cardioversions done since that time  . Mitral  regurgitation    Mild echo  . Myocardial infarction ([place]) ~ 2002  . Nasal drainage    Chronic  . On home oxygen therapy    "2L prn; ~  4h/day" (03/06/2018)  . Overweight(278.02)   . Pericardial effusion   . Pleural effusion    Large loculated effusion on the left side November, 2011. This was tapped. It was exudative. Cytology revealed no cancer no proof of mesothelioma area pulmonary team felt that no further workup was needed  . Pleural thickening   . Pneumonia    "several times" (03/09/2018)  . 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 Presence of permanent cardiac pacemaker   . SOB (shortness of breath)    Large left effusion/ thoracentesis/hospitalization/November, 2011... exudated.. cytology negative.. Dr.Wert.. no proof of mesothelioma  . Spinal stenosis    Surgery Dr.Elsner  . Thrombophlebitis of superficial veins of upper extremities    Possible venous stenosis from defibrillator  . Type II diabetes mellitus ([place]) 2008  . Venous insufficiency    Toe discoloration chronic  . Ventral hernia    April, 2014, result of his abdominal surgery  . Warfarin anticoagulation   . Wide-complex tachycardia (HCC)     Assessment: 34 yoM on warfarin PTA for hx of PAF. INR on admit was 4.7 and bumped to up above 5 despite holding warfarin.   INR Now trending down 2.8 -  Will continue to hold dosing for now (last dose 12/15 at home). CBC stable, LFTs wnl.   *PTA Warfarin Dose = 2.5mg  daily except 5mg  Tues  Goal of Therapy:  INR 2-3 Monitor platelets by anticoagulation protocol: Yes   Plan:  -Hold warfarin today -Monitor daily INR, CBC, s/sx bleeding -Give 1x dose of fosfomycin and follow progression/symptoms  Bonnita Nasuti Pharm.D. CPP, BCPS Clinical Pharmacist [phone] 03/12/2018 2:57 PM

## 2018-03-12 NOTE — Progress Notes (Signed)
[facility] ROUNDING NOTE   Subjective:   Appears to be resting comfortably.  Has no specific complaints this morning.  Blood pressure 118/76 pulse 98 temperature 97.9.  O2 sats 2 L nasal cannula 94%  Milrinone drip Amiodarone drip  Urine output 610 cc 03/11/2018 weight increased to 101.5 kg  Sodium 124 potassium 4.5 chloride 80 CO2 22 glucose 283 BUN 95 creatinine 4.11 WBC 12.9 hemoglobin 12.3 platelets 153 INR 2.84  Renal ultrasound no evidence of hydronephrosis right greater than left renal cortical atrophy and right kidney 11 cm left kidney 11.6 cm  Antibiotics IV Rocephin.   Objective:  Vital signs in last 24 hours:  Temp:  [96.9 F (36.1 C)-97.9 F (36.6 C)] 97.9 F (36.6 C) (12/23 0430) Pulse Rate:  [57-104] 98 (12/23 0430) Resp:  [13-19] 18 (12/23 0430) BP: (109-122)/(63-98) 121/80 (12/23 0430) SpO2:  [90 %-96 %] 94 % (12/23 0430) Weight:  [101.5 kg] 101.5 kg (12/23 0300)  Weight change: 3 kg Filed Weights   03/10/18 0500 03/11/18 0300 03/12/18 0300  Weight: 97.7 kg 98.5 kg 101.5 kg    Intake/Output: I/O last 3 completed shifts: In: 1418.2 [P.O.:360; I.V.:968; IV Piggyback:90.2] Out: 610 [Urine:610]   Intake/Output this shift:  No intake/output data recorded.   Week in bed no respiratory difficulty CVS-regular rate and rhythm no rubs or gallops.  JVP elevated RS-diminished breath sounds at bases otherwise clear ABD- BS present soft non-distended EXT-1+ edema with TED hose   Basic Metabolic Panel: Recent Labs  Lab 03/07/18 0315  03/09/18 0508 03/09/18 1750 03/10/18 0436 03/10/18 1356 03/11/18 0312 03/12/18 0444  NA 129*   < > 128* 129* 127* 124* 124* 124*  K 3.4*   < > 2.8* 3.1* 3.8 3.6 4.9 4.5  CL 86*   < > 83* 85* 82* 80* 81* 80*  CO2 26   < > 27 30 26 24 24 22   GLUCOSE 161*   < > 168* 172* 254* 344* 265* 283*  BUN 89*   < > 85* 84* 82* 87* 91* 95*  CREATININE 3.43*   < > 3.53* 3.60* 3.55* 3.66* 3.89* 4.11*  CALCIUM 8.4*    < > 8.6* 8.8* 8.3* 8.5* 8.7* 8.5*  MG 2.5*  --  2.4  --   --   --  2.3  --   PHOS  --   --   --   --   --  5.0*  --   --    < > = values in this interval not displayed.    Liver Function Tests: Recent Labs  Lab 03/08/18 0331 03/10/18 1356  AST 14*  --   ALT 16  --   ALKPHOS 63  --   BILITOT 1.7*  --   PROT 5.9*  --   ALBUMIN 3.4* 3.1*   No results for input(s): LIPASE, AMYLASE in the last 168 hours. No results for input(s): AMMONIA in the last 168 hours.  CBC: Recent Labs  Lab 03/08/18 0331 03/09/18 0508 03/10/18 0436 03/11/18 0312 03/12/18 0444  WBC 6.3 5.9 7.8 12.9* 12.9*  HGB 12.0* 11.2* 11.9* 11.9* 12.3*  HCT 35.9* 34.0* 35.3* 36.4* 36.3*  MCV 88.6 88.8 90.5 89.0 89.4  PLT 196 176 172 158 153    Cardiac Enzymes: No results for input(s): CKTOTAL, CKMB, CKMBINDEX, TROPONINI in the last 168 hours.  BNP: Invalid input(s): POCBNP  CBG: Recent Labs  Lab 03/10/18 2137 03/11/18 0811 03/11/18 1212 03/11/18 1639 03/11/18 2105  GLUCAP 114*  53* 26* 30* 25*    Microbiology: Results for orders placed or performed during the hospital encounter of 02/23/18  MRSA PCR Screening     Status: None   Collection Time: 02/23/18  3:43 PM  Result Value Ref Range Status   MRSA by PCR NEGATIVE NEGATIVE Final    Comment:        The GeneXpert MRSA Assay (FDA approved for NASAL specimens only), is one component of a comprehensive MRSA colonization surveillance program. It is not intended to diagnose MRSA infection nor to guide or monitor treatment for MRSA infections. Performed at [facility], [place] [address], [place], [place] [address]    *Note: Due to a large number of results and/or encounters for the requested time period, some results have not been displayed. A complete set of results can be found in Results Review.    Coagulation Studies: Recent Labs    03/10/18 0436 03/11/18 0312 03/12/18 0444  LABPROT 31.3* 32.4* 29.4*  INR 3.08 3.22 2.84     Urinalysis: Recent Labs    03/10/18 1318  COLORURINE YELLOW  LABSPEC 1.015  PHURINE 5.5  GLUCOSEU NEGATIVE  HGBUR LARGE*  BILIRUBINUR NEGATIVE  KETONESUR NEGATIVE  PROTEINUR 30*  NITRITE NEGATIVE  LEUKOCYTESUR LARGE*      Imaging: No results found.   Medications:   . sodium chloride Stopped (03/09/18 1540)  . amiodarone 30 mg/hr (03/12/18 0600)  . cefTRIAXone (ROCEPHIN)  IV Stopped (03/11/18 1441)  . milrinone 0.125 mcg/kg/min (03/12/18 0600)   . feeding supplement (ENSURE ENLIVE)  237 mL Oral BID BM  . insulin aspart  0-15 Units Subcutaneous TID WC  . insulin aspart  0-5 Units Subcutaneous QHS  . insulin aspart  3 Units Subcutaneous TID WC  . ipratropium  2 spray Each Nare Daily  . mouth rinse  15 mL Mouth Rinse BID  . midodrine  10 mg Oral TID WC  . pantoprazole  40 mg Oral Daily  . sodium chloride flush  10-40 mL Intracatheter Q12H  . sodium chloride flush  3 mL Intravenous Q12H  . Warfarin - Pharmacist Dosing Inpatient   Does not apply q1800   sodium chloride, acetaminophen, ondansetron (ZOFRAN) IV, polyvinyl alcohol, sodium chloride flush, sodium chloride flush  Assessment/ Plan:   Acute on chronic kidney disease with progressive cardiorenal syndrome.  Creatinine has continued to increase.  Decompression with Foley has failed to lead to an improvement in renal function.  Started Midrin in effort to improve cardiac output and renal perfusion.  Not candidate for dialysis treatment due to multiple medical problems patient now DNR working on quality of life.  Hypotension/volume appears to have significantly improved with Midrin.  Now no longer on antihypertensive medications with discontinuation of Coreg.  Continues on milrinone to maintain co-ox.  Appears to be significantly volume overloaded.  Lasix appears to been administrated on 12/22/ 19-second dose not administrated this Lasix was discontinued.  Anemia does not appear to be an issue at this time  Acute  on chronic systolic heart failure cardiogenic shock ischemic cardiomyopathy with ejection fraction 35 to 40% on 2D echo in December 2019 Medtronic CRT/D LV lead turned off due to high threshold for a wide QRS.  Question would be whether to restart Lasix today.  Will need to coordinate this with cardiology.  It looks like we are moving towards palliative care.  Atrial fibrillation continues on Coumadin anticoagulation amiodarone no longer on carvedilol due to his negative inotropic effect  CAD status post  CABG.  No aspirin warfarin and use  Hypokalemia repleted  Hyponatremia secondary to volume overload.  Would avoid metolazone or thiazide type diuretics as this will compound his hyponatremia.  Restart Lasix per cardiology will need to coordinate if this is the plan.  I would probably recommend doses greater than 80 mg 3 times daily  DNR/DNI orders written palliative care help appreciated      LOS: [place] @TODAY @7 :34 AM

## 2018-03-12 NOTE — Plan of Care (Signed)
  Problem: Clinical Measurements: Goal: Cardiovascular complication will be avoided Outcome: Progressing   Problem: Coping: Goal: Level of anxiety will decrease Outcome: Progressing   Problem: Pain Managment: Goal: General experience of comfort will improve Outcome: Progressing   Problem: Skin Integrity: Goal: Risk for impaired skin integrity will decrease Outcome: Progressing

## 2018-03-12 NOTE — Progress Notes (Signed)
OT Cancellation Note  Patient Details Name: [name] MRN: [id number] DOB: [birth year]   Cancelled Treatment:    Reason Eval/Treat Not Completed: Fatigue/lethargy limiting ability to participate. Will follow.  Malka So 03/12/2018, 12:31 PM  Nestor Lewandowsky, OTR/L Acute Rehabilitation Services Pager: [phone] Office: [phone]

## 2018-03-12 NOTE — Progress Notes (Addendum)
Patient ID: [name], male   DOB: [birth year], 77 y.o.   MRN: [id number]     Advanced Heart Failure Rounding Note  PCP-Cardiologist: No primary care provider on file.   Subjective:    Milrinone restarted 03/10/18 for low co-ox (46%). Co-ox 47.8% today on milrinone 0.125 mcg/kg/min.  Made DNR/DNI after long family discussion 03/10/18.   More alert this am. Alert to person and place. SOB after 5-6 words. Says he is just not himself.   Started on IV abx for UA 03/11/18. BCx and UCx pending.   Objective:   Weight Range: 101.5 kg Body mass index is 30.35 kg/m.   Vital Signs:   Temp:  [96.9 F (36.1 C)-97.9 F (36.6 C)] 97.9 F (36.6 C) (12/23 0430) Pulse Rate:  [57-104] 98 (12/23 0430) Resp:  [13-19] 18 (12/23 0430) BP: (109-134)/(63-98) 121/80 (12/23 0430) SpO2:  [90 %-96 %] 94 % (12/23 0430) Weight:  [101.5 kg] 101.5 kg (12/23 0300) Last BM Date: 03/11/18  Weight change: Filed Weights   03/10/18 0500 03/11/18 0300 03/12/18 0300  Weight: 97.7 kg 98.5 kg 101.5 kg   Intake/Output:   Intake/Output Summary (Last 24 hours) at 03/12/2018 0726 Last data filed at 03/12/2018 0600 Gross per 24 hour  Intake 908.28 ml  Output 385 ml  Net 523.28 ml    Physical Exam   General: Sitting up in bed. Alert.  HEENT: Normal Neck: Supple. JVP to jaw. Carotids 2+ bilat; no bruits. No thyromegaly or nodule noted. Cor: PMI nondisplaced. Irregularly irregular. No M/G/R noted Lungs: Diminished.  Abdomen: Soft, non-tender, non-distended, no HSM. No bruits or masses. +BS  Extremities: No cyanosis, clubbing, or rash. Trace ankle edema.  Neuro: Alert & oriented to person and place. Cranial nerves grossly intact. moves all 4 extremities w/o difficulty. Affect pleasant   Telemetry   Afib 90s with PVCs, no further VT, personally reviewed.   EKG   No new tracings.    Labs    CBC Recent Labs    03/11/18 0312 03/12/18 0444  WBC 12.9* 12.9*  HGB 11.9* 12.3*  HCT 36.4* 36.3*    MCV 89.0 89.4  PLT 158 767   Basic Metabolic Panel Recent Labs    03/10/18 1356 03/11/18 0312 03/12/18 0444  NA 124* 124* 124*  K 3.6 4.9 4.5  CL 80* 81* 80*  CO2 24 24 22   GLUCOSE 344* 265* 283*  BUN 87* 91* 95*  CREATININE 3.66* 3.89* 4.11*  CALCIUM 8.5* 8.7* 8.5*  MG  --  2.3  --   PHOS 5.0*  --   --    Liver Function Tests Recent Labs    03/10/18 1356  ALBUMIN 3.1*   No results for input(s): LIPASE, AMYLASE in the last 72 hours. Cardiac Enzymes No results for input(s): CKTOTAL, CKMB, CKMBINDEX, TROPONINI in the last 72 hours.  BNP: BNP (last 3 results) Recent Labs    12/29/17 1048 02/22/18 1215  BNP 1,274.1* 2,025.5*    ProBNP (last 3 results) No results for input(s): PROBNP in the last 8760 hours.   D-Dimer No results for input(s): DDIMER in the last 72 hours. Hemoglobin A1C No results for input(s): HGBA1C in the last 72 hours. Fasting Lipid Panel No results for input(s): CHOL, HDL, LDLCALC, TRIG, CHOLHDL, LDLDIRECT in the last 72 hours. Thyroid Function Tests No results for input(s): TSH, T4TOTAL, T3FREE, THYROIDAB in the last 72 hours.  Invalid input(s): FREET3  Other results:   Imaging    No results found.  Medications:     Scheduled Medications: . feeding supplement (ENSURE ENLIVE)  237 mL Oral BID BM  . insulin aspart  0-15 Units Subcutaneous TID WC  . insulin aspart  0-5 Units Subcutaneous QHS  . insulin aspart  3 Units Subcutaneous TID WC  . ipratropium  2 spray Each Nare Daily  . mouth rinse  15 mL Mouth Rinse BID  . midodrine  10 mg Oral TID WC  . pantoprazole  40 mg Oral Daily  . sodium chloride flush  10-40 mL Intracatheter Q12H  . sodium chloride flush  3 mL Intravenous Q12H  . Warfarin - Pharmacist Dosing Inpatient   Does not apply q1800    Infusions: . sodium chloride Stopped (03/09/18 1540)  . amiodarone 30 mg/hr (03/12/18 0600)  . cefTRIAXone (ROCEPHIN)  IV Stopped (03/11/18 1441)  . milrinone 0.125 mcg/kg/min  (03/12/18 0600)    PRN Medications: sodium chloride, acetaminophen, ondansetron (ZOFRAN) IV, polyvinyl alcohol, sodium chloride flush, sodium chloride flush    Patient Profile   [name] is a 77 y.o. male  with history of CAD s/p CABG, permanent atrial fib, S/P AAA repair, ischemic cardiomyopathy/chronic systolic heart failure,  Medtronic CRT-D system.   Admitted from HF clinic 02/19/2018 with volume overload and AKI.   Assessment/Plan   1.Acute on chronic systolic CHF -> cardiogenic shock: Ischemic cardiomyopathy. 02/2018 Echo, EF 35-40%, stable. Medtronic CRT-D but LV lead turned off due to high threshold. Has a very wide QRS complex.  11/20109 Unable to place epicardial lead surgically. Patient was admitted earlier in 12/19 after [place] on 02/23/18 showed low output with CI 1.9 and elevated filling pressures. Creatinine was elevated to the 2.9 range.   He was started on milrinone and diuresed.  Milrinone weaned and he was sent home.  Admitted again with worsening SOB and recurrent worsening renal failure. Was on milrinone 0.25 with improved co-ox. Milrinone stopped 12/20 due to NSVT - He has end-stage HF with cardio-renal syndrome. Now DNR/DNI - Co-ox back down to 47.8% on milrinone 0.125. Recheck.  - CVP 18-19 this am. Renal function trending up. May have to retry on high dose IV lasix, if not improvement, with non-HD candidacy, will be a hospice/palliative situation. Await repeat Coox for further.  - Long talk with his wife and son 03/11/18. Reiterated that he has end-stage HF with multi-system organ failure and survival rate likely 50% at 4-6 months. Situation now complicated by UTI and worsening AKI +/-uremia.  We discussed options of 1) continue medical therapy (which has been unsuccessful); 2) trial of home inotropes (with its incumbent risks) or 3) palliative care/hopsice. We also discussed Code status. He wants to be DNR but both he and his wife would like to do everything  possible to try and maintain his quality and quantity of life and would be open to home milrinone. He is not VAD candidate with CKD. - Off carvedilol carvedilol and hydralazine/Imdur due to low BP - No ACEI/ARB/ARNI/spironolactone with AKI.  2. Obtundation - likely multifactorial with sundowning, advanced HF, UTI, uremia, etc. But most likely related to dose of Ativan 03/11/18.  - Continue IV meds where possible.  - Started on ABX for UTI. Continue supportive care 3. Atrial fibrillation: Permanent  -Continue coumadin. No bleeding.  - Rate controlled on IV  amio  4. CAD: s/p CABG.   - No s/s of ischemia.    - No ASA given warfarin use.  - Continue statin. Good lipids 4/19.  5.AKI onCKD Stage III:  Progressive cardiorenal syndrome, creatinine up to 3.8 at readmission.  - Cr 3.66 -> 3.89 -> 4.11 Patient complained of urinary retention over the last day or so, bladder scan today with >900 cc.   - Creatinine trending up, even with restart of milrinone.  - Renal function worsening in setting of cardio-renal syndrome. - Appreciate Renal input. Not candidate for HD, he is not a candidate for HD.  6. Hypokalemia:  - K 4.5 today.  7. PVCs: Frequent.  - Continue amiodarone for VT as below.   8. Deconditioning:  - Continue PT/OT as tolerated.  9. VT  - Had asymptomatic VT on 03/09/18 and started on IV amiodarone. Continue amiodarone.  - Keep K> 4.0 mg > 2.0 10. UTI - Started rocephin 03/11/18 for UTI.  - UCX and BCx pending.  11. DNR/DNI  - He is now DNR/DNI.   Patient is critically ill and in multiple system organ failure. Prognosis guarded.   ADDENDUM: Discussed with MD. Repeat coox 46%. Increase milrinone to 0.25 mcg/kg/min and give lasix 120 mg/BID and follow response.    If pressures drop, may need to move to ICU. Prognosis remains guarded.  Length of Stay: [address]  Annamaria Helling  03/12/2018 7:26 AM  Advanced Heart Failure Team Pager [phone] (M-F; 7a - 4p)    Please contact [facility] for night-coverage after hours (4p -7a ) and weekends on [email or web address]  Agree with above.  More alert today but co-ox worse despite milrinone support. Creatinine worse and CVP 15-18 also going up. Minimal response to high-dose IV lasix. More SOB on exam. On rocephin for serratia UTI. On amio for AF with RVR.   On exam Elderly male sitting up in bed + dyspneic  Falls asleep midsentence JVP to ear Cor IRR + s3 Lungs + basilar crackles Ab soft NT ND Ext cool 1+ edema  He is critically ill and actively dying with end-stage HF and multi-system organ failure. Renal function much worse. Nearly anuric. Co-ox low despite milrinone. He is not a candidate for HD. Long talk with patient and his family about situation and I reiterated the fact that he is nearing the end of his life. They want to remain somewhat aggressive for now. Will increase milrinone and continue high-dose lasix. Follow co-ox and CVP. Low-dose morphine for comfort. They understand that if his breathing gets worse we will switch to full comfort drips and not let him suffer.   CRITICAL CARE Performed by: Glori Bickers  Total critical care time: 45 minutes  Critical care time was exclusive of separately billable procedures and treating other patients.  Critical care was necessary to treat or prevent imminent or life-threatening deterioration.  Critical care was time spent personally by me (independent of midlevel providers or residents) on the following activities: development of treatment plan with patient and/or surrogate as well as nursing, discussions with consultants, evaluation of patient's response to treatment, examination of patient, obtaining history from patient or surrogate, ordering and performing treatments and interventions, ordering and review of laboratory studies, ordering and review of radiographic studies, pulse oximetry and re-evaluation of patient's condition.   Glori Bickers,  MD  6:56 PM

## 2018-03-12 NOTE — Progress Notes (Signed)
Physical Therapy Treatment Patient Details Name: [name] MRN: [id number] DOB: 03/16/1941 Today's Date: 03/12/2018    History of Present Illness Pt is a 77 y.o. M with significant PMH of back surgery, CAD s/p CABG, a fib, s/p AAA repair, chronic systolic heart failure who was admitted with volume overload and AKI.    PT Comments    Pt admitted with above diagnosis. Pt currently with functional limitations due to balance and endurance deficits. Pt was able to stand pivot to recliner with min assist but did not want to do anything but that a few exercises.  Wife present.  Will continue and progress pt as able.  Pt will benefit from skilled PT to increase their independence and safety with mobility to allow discharge to the venue listed below.     Follow Up Recommendations  Home health PT;Supervision/Assistance - 24 hour(If pt does not have 24 hour care, SNF recommended)     Equipment Recommendations  Other (comment);3in1 (PT)(Rollator)    Recommendations for Other Services OT consult     Precautions / Restrictions Precautions Precautions: Fall Restrictions Weight Bearing Restrictions: No    Mobility  Bed Mobility               General bed mobility comments: sitting EOB on arrival  Transfers Overall transfer level: Needs assistance Equipment used: Rolling walker (2 wheeled) Transfers: Sit to/from [facility] Sit to Stand: Min assist Stand pivot transfers: Min assist       General transfer comment: Stood and pivoted with min assist to pivot to recliner.   Ambulation/Gait             General Gait Details: declined to walk.     Stairs             Wheelchair Mobility    Modified Rankin (Stroke Patients Only)       Balance Overall balance assessment: Needs assistance Sitting-balance support: Feet supported Sitting balance-Leahy Scale: Fair     Standing balance support: Bilateral upper extremity supported Standing  balance-Leahy Scale: Poor Standing balance comment: Needed UE support for balance to pivot to recliner.                             Cognition Arousal/Alertness: Awake/alert Behavior During Therapy: WFL for tasks assessed/performed Overall Cognitive Status: Within Functional Limits for tasks assessed                                        Exercises General Exercises - Lower Extremity Ankle Circles/Pumps: AROM;Both;10 reps;Supine Long Arc Quad: AROM;Both;10 reps;Seated    General Comments General comments (skin integrity, edema, etc.): Pt with poor activity tolerance.       Pertinent Vitals/Pain Pain Assessment: Faces Faces Pain Scale: Hurts little more Pain Location: chronic back pain Pain Descriptors / Indicators: Aching Pain Intervention(s): Limited activity within patient's tolerance;Monitored during session;Repositioned    Home Living                      Prior Function            PT Goals (current goals can now be found in the care plan section) Acute Rehab PT Goals Patient Stated Goal: "not be so tired." Progress towards PT goals: Progressing toward goals    Frequency    Min 3X/week  PT Plan Current plan remains appropriate    Co-evaluation              AM-PAC PT "6 Clicks" Mobility   Outcome Measure  Help needed turning from your back to your side while in a flat bed without using bedrails?: None Help needed moving from lying on your back to sitting on the side of a flat bed without using bedrails?: None Help needed moving to and from a bed to a chair (including a wheelchair)?: A Little Help needed standing up from a chair using your arms (e.g., wheelchair or bedside chair)?: A Little Help needed to walk in hospital room?: Total Help needed climbing 3-5 steps with a railing? : A Lot 6 Click Score: 17    End of Session Equipment Utilized During Treatment: Gait belt;Oxygen Activity Tolerance: Patient  limited by fatigue Patient left: with call bell/phone within reach;in chair;with family/visitor present Nurse Communication: Mobility status PT Visit Diagnosis: Unsteadiness on feet (R26.81);Muscle weakness (generalized) (M62.81);Difficulty in walking, not elsewhere classified (R26.2)     Time: 5945-8592 PT Time Calculation (min) (ACUTE ONLY): 17 min  Charges:  $Therapeutic Activity: 8-22 mins                     [place] Pager:  [phone]  Office:  [place] 03/12/2018, 11:19 AM

## 2018-03-12 NOTE — Care Management Important Message (Signed)
Important Message  Patient Details  Name: [name] MRN: [id number] Date of Birth: Jan 16, 1941   Medicare Important Message Given:  Yes    Adrianah Prophete P Lowry Bala 03/12/2018, 2:59 PM

## 2018-03-13 LAB — URINE CULTURE: Culture: >=100000 — AB

## 2018-03-13 LAB — GLUCOSE, CAPILLARY
GLUCOSE-CAPILLARY: 157 mg/dL — AB (ref 70–99)
GLUCOSE-CAPILLARY: 173 mg/dL — AB (ref 70–99)
Glucose-Capillary: 161 mg/dL — ABNORMAL HIGH (ref 70–99)
Glucose-Capillary: 127 mg/dL — ABNORMAL HIGH (ref 70–99)

## 2018-03-13 LAB — CBC
HEMATOCRIT: 37.5 % — AB (ref 39.0–52.0)
Hemoglobin: 12.5 g/dL — ABNORMAL LOW (ref 13.0–17.0)
MCH: 29.1 pg (ref 26.0–34.0)
MCHC: 33.3 g/dL (ref 30.0–36.0)
MCV: 87.4 fL (ref 80.0–100.0)
Platelets: 146 10*3/uL — ABNORMAL LOW (ref 150–400)
RBC: 4.29 MIL/uL (ref 4.22–5.81)
RDW: 15 % (ref 11.5–15.5)
WBC: 8.9 10*3/uL (ref 4.0–10.5)
nRBC: 0.2 % (ref 0.0–0.2)

## 2018-03-13 LAB — BASIC METABOLIC PANEL
Anion gap: 17 — ABNORMAL HIGH (ref 5–15)
Anion gap: 17 — ABNORMAL HIGH (ref 5–15)
BUN: 87 mg/dL — AB (ref 8–23)
BUN: 102 mg/dL — ABNORMAL HIGH (ref 8–23)
CO2: 19 mmol/L — ABNORMAL LOW (ref 22–32)
CO2: 23 mmol/L (ref 22–32)
Calcium: 7.1 mg/dL — ABNORMAL LOW (ref 8.9–10.3)
Calcium: 8.8 mg/dL — ABNORMAL LOW (ref 8.9–10.3)
Chloride: 91 mmol/L — ABNORMAL LOW (ref 98–111)
Chloride: 83 mmol/L — ABNORMAL LOW (ref 98–111)
Creatinine, Ser: 3.53 mg/dL — ABNORMAL HIGH (ref 0.61–1.24)
Creatinine, Ser: 4.56 mg/dL — ABNORMAL HIGH (ref 0.61–1.24)
GFR calc Af Amer: 18 mL/min — ABNORMAL LOW (ref >60)
GFR calc Af Amer: 13 mL/min — ABNORMAL LOW (ref >60)
GFR calc non Af Amer: 16 mL/min — ABNORMAL LOW (ref >60)
GFR calc non Af Amer: 12 mL/min — ABNORMAL LOW (ref >60)
GLUCOSE: 154 mg/dL — AB (ref 70–99)
Glucose, Bld: 165 mg/dL — ABNORMAL HIGH (ref 70–99)
Potassium: 3 mmol/L — ABNORMAL LOW (ref 3.5–5.1)
Potassium: 4.3 mmol/L (ref 3.5–5.1)
Sodium: 127 mmol/L — ABNORMAL LOW (ref 135–145)
Sodium: 123 mmol/L — ABNORMAL LOW (ref 135–145)

## 2018-03-13 LAB — COOXEMETRY PANEL
Carboxyhemoglobin: 1 % (ref 0.5–1.5)
Carboxyhemoglobin: 1.1 % (ref 0.5–1.5)
Methemoglobin: 1.5 % (ref 0.0–1.5)
Methemoglobin: 1.6 % — ABNORMAL HIGH (ref 0.0–1.5)
O2 SAT: 48.9 %
O2 Saturation: 64 %
Total hemoglobin: 13.3 g/dL (ref 12.0–16.0)
Total hemoglobin: 12.2 g/dL (ref 12.0–16.0)

## 2018-03-13 LAB — PROTIME-INR
INR: 1.95
Prothrombin Time: 22 seconds — ABNORMAL HIGH (ref 11.4–15.2)

## 2018-03-13 LAB — MAGNESIUM: Magnesium: 1.9 mg/dL (ref 1.7–2.4)

## 2018-03-13 MED ORDER — POTASSIUM CHLORIDE CRYS ER 20 MEQ PO TBCR: 40.0000 meq | EXTENDED_RELEASE_TABLET | Freq: Once | ORAL | Status: DC

## 2018-03-13 MED ORDER — FUROSEMIDE 10 MG/ML IJ SOLN
120.0000 mg | Freq: Three times a day (TID) | INTRAVENOUS | Status: DC
  Administered 2018-03-13 – 2018-03-14 (×5): 120 mg via INTRAVENOUS
  Filled 2018-03-13: qty 10
  Filled 2018-03-13: qty 12
  Filled 2018-03-13: qty 10
  Filled 2018-03-13: qty 12
  Filled 2018-03-13: qty 2
  Filled 2018-03-13 (×2): qty 12

## 2018-03-13 MED ORDER — POTASSIUM CHLORIDE CRYS ER 20 MEQ PO TBCR
60.0000 meq | EXTENDED_RELEASE_TABLET | Freq: Once | ORAL | Status: AC
  Administered 2018-03-13: 60 meq via ORAL
  Filled 2018-03-13: qty 3

## 2018-03-13 MED ORDER — SODIUM CHLORIDE 0.9 % IV SOLN
1.0000 g | INTRAVENOUS | Status: DC
  Administered 2018-03-13 – 2018-03-14 (×2): 1 g via INTRAVENOUS
  Filled 2018-03-13 (×3): qty 1

## 2018-03-13 MED ORDER — WARFARIN SODIUM 2.5 MG PO TABS
2.5000 mg | ORAL_TABLET | Freq: Once | ORAL | Status: AC
  Administered 2018-03-13: 2.5 mg via ORAL
  Filled 2018-03-13: qty 1

## 2018-03-13 MED ORDER — POTASSIUM CHLORIDE 10 MEQ/100ML IV SOLN: 10.0000 meq | INTRAVENOUS | Status: DC

## 2018-03-13 MED ORDER — MORPHINE SULFATE (PF) 2 MG/ML IV SOLN
1.0000 mg | Freq: Once | INTRAVENOUS | Status: AC
  Administered 2018-03-13: 1 mg via INTRAVENOUS
  Filled 2018-03-13: qty 1

## 2018-03-13 NOTE — Progress Notes (Addendum)
Patient ID: [name], male   DOB: 1940-12-17, 77 y.o.   MRN: [id number]     Advanced Heart Failure Rounding Note  PCP-Cardiologist: No primary care provider on file.   Subjective:    Coox 48.9% this am despite milrinone increase to 0.25 mcg/kg/min. Made DNR/DNI after long family discussion 03/10/18.   I/Os nearly even (Only negative 30 cc)  Worsened throughout day 12/23.  Had more VT overnight and increased discomfort/anxiety. He is very shaky this morning, and having trouble holding on to anything. Can't put his hearing aids in.   Started on IV abx for UA 03/11/18. BCx pending. UCx + for Serratia Marcescens  Objective:   Weight Range: 99.4 kg Body mass index is 29.72 kg/m.   Vital Signs:   Temp:  [97.3 F (36.3 C)-98.2 F (36.8 C)] 97.6 F (36.4 C) (12/24 0400) Pulse Rate:  [54-113] 96 (12/24 0400) Resp:  [16-22] 22 (12/23 2338) BP: (106-143)/(68-92) 140/92 (12/24 0400) SpO2:  [89 %-97 %] 93 % (12/24 0400) FiO2 (%):  [40 %] 40 % (12/23 1022) Weight:  [99.4 kg] 99.4 kg (12/24 0332) Last BM Date: 03/11/18  Weight change: Filed Weights   03/11/18 0300 03/12/18 0300 03/13/18 0332  Weight: 98.5 kg 101.5 kg 99.4 kg   Intake/Output:   Intake/Output Summary (Last 24 hours) at 03/13/2018 0716 Last data filed at 03/13/2018 0600 Gross per 24 hour  Intake 1072.64 ml  Output 1100 ml  Net -27.36 ml    Physical Exam   General: Chronically ill and elderly appearing. Uncomfortable.  HEENT: Normal Neck: Supple. JVP to jaw. Carotids 2+ bilat; no bruits. No thyromegaly or nodule noted. Cor: PMI nondisplaced. Irregularly irregular. No M/G/R noted Lungs: Diminished throughout.  Abdomen: Soft, non-tender, non-distended, no HSM. No bruits or masses. +BS  Extremities: No cyanosis, clubbing, or rash. Trace to 1+ ankle edema.  Neuro: Alert & oriented to person and place, cranial nerves grossly intact. moves all 4 extremities w/o difficulty. Affect flat but appropriate.  +tremulous.  Telemetry   Afib 100-110s with PVCs, several runs of VT, personally reviewed.   EKG   No new tracings.    Labs    CBC Recent Labs    03/12/18 0444 03/13/18 0353  WBC 12.9* 8.9  HGB 12.3* 12.5*  HCT 36.3* 37.5*  MCV 89.4 87.4  PLT 153 948*   Basic Metabolic Panel Recent Labs    03/10/18 1356 03/11/18 0312 03/12/18 0444 03/13/18 0353  NA 124* 124* 124* 127*  K 3.6 4.9 4.5 3.0*  CL 80* 81* 80* 91*  CO2 24 24 22  19*  GLUCOSE 344* 265* 283* 154*  BUN 87* 91* 95* 87*  CREATININE 3.66* 3.89* 4.11* 3.53*  CALCIUM 8.5* 8.7* 8.5* 7.1*  MG  --  2.3  --   --   PHOS 5.0*  --   --   --    Liver Function Tests Recent Labs    03/10/18 1356  ALBUMIN 3.1*   No results for input(s): LIPASE, AMYLASE in the last 72 hours. Cardiac Enzymes No results for input(s): CKTOTAL, CKMB, CKMBINDEX, TROPONINI in the last 72 hours.  BNP: BNP (last 3 results) Recent Labs    12/29/17 1048 02/22/18 1215  BNP 1,274.1* 2,025.5*    ProBNP (last 3 results) No results for input(s): PROBNP in the last 8760 hours.   D-Dimer No results for input(s): DDIMER in the last 72 hours. Hemoglobin A1C No results for input(s): HGBA1C in the last 72 hours. Fasting Lipid Panel  No results for input(s): CHOL, HDL, LDLCALC, TRIG, CHOLHDL, LDLDIRECT in the last 72 hours. Thyroid Function Tests No results for input(s): TSH, T4TOTAL, T3FREE, THYROIDAB in the last 72 hours.  Invalid input(s): FREET3  Other results:   Imaging    No results found.   Medications:     Scheduled Medications: . feeding supplement (ENSURE ENLIVE)  237 mL Oral BID BM  . insulin aspart  0-15 Units Subcutaneous TID WC  . insulin aspart  0-5 Units Subcutaneous QHS  . insulin aspart  3 Units Subcutaneous TID WC  . ipratropium  2 spray Each Nare Daily  . mouth rinse  15 mL Mouth Rinse BID  . midodrine  10 mg Oral TID WC  . pantoprazole  40 mg Oral Daily  . sodium chloride flush  10-40 mL Intracatheter  Q12H  . sodium chloride flush  3 mL Intravenous Q12H  . Warfarin - Pharmacist Dosing Inpatient   Does not apply q1800    Infusions: . sodium chloride Stopped (03/09/18 1540)  . amiodarone 30 mg/hr (03/13/18 0600)  . cefTRIAXone (ROCEPHIN)  IV Stopped (03/12/18 1329)  . furosemide Stopped (03/13/18 0514)  . milrinone 0.25 mcg/kg/min (03/13/18 0600)    PRN Medications: sodium chloride, acetaminophen, morphine injection, ondansetron (ZOFRAN) IV, polyvinyl alcohol, sodium chloride flush, sodium chloride flush    Patient Profile   [name] is a 77 y.o. male  with history of CAD s/p CABG, permanent atrial fib, S/P AAA repair, ischemic cardiomyopathy/chronic systolic heart failure,  Medtronic CRT-D system.   Admitted from HF clinic 03/18/2018 with volume overload and AKI.   Assessment/Plan   1.Acute on chronic systolic CHF -> cardiogenic shock: Ischemic cardiomyopathy. 02/2018 Echo, EF 35-40%, stable. Medtronic CRT-D but LV lead turned off due to high threshold. Has a very wide QRS complex.  11/20109 Unable to place epicardial lead surgically. Patient was admitted earlier in 12/19 after [place] on 02/23/18 showed low output with CI 1.9 and elevated filling pressures. Creatinine was elevated to the 2.9 range.   He was started on milrinone and diuresed.  Milrinone weaned and he was sent home.  Admitted again with worsening SOB and recurrent worsening renal failure. Was on milrinone 0.25 with improved co-ox. Milrinone stopped 12/20 due to VT. - He has end-stage HF with cardio-renal syndrome. Now DNR/DNI - Co-ox remains low at 48.9% on milrinone 0.25 mcg/kg/min. He has not had great response and has had more VT, would not increase.   Given dose of 120 mg IV lasix this am.  Follow response and repeat this afternoon.  Family is aware we have no means of escalating his current care.  - CVP 17-18 this am.  - Long talk with his wife and son 03/11/18. Reiterated that he has end-stage HF with  multi-system organ failure and survival rate likely 50% at 4-6 months. Situation now complicated by UTI and worsening AKI +/-uremia.  We discussed options of 1) continue medical therapy (which has been unsuccessful); 2) trial of home inotropes (with its incumbent risks) or 3) palliative care/hopsice. We also discussed Code status. He wants to be DNR but both he and his wife would like to do everything possible to try and maintain his quality and quantity of life and would be open to home milrinone. He is not VAD candidate with CKD. - Off carvedilol carvedilol and hydralazine/Imdur due to low BP - No ACEI/ARB/ARNI/spironolactone with AKI.  2. Obtundation - likely multifactorial with sundowning, advanced HF, UTI, uremia, etc. But most likely  related to dose of Ativan 03/11/18.  - Continue IV meds where possible.  - Started on ABX for UTI. Continue supportive care 3. Atrial fibrillation: Permanent  - Coumadin on hold with acute illness.  - Rate controlled on IV  amio  4. CAD: s/p CABG.   - No s/s of ischemia.    - No ASA given warfarin use.  - Continue statin. Good lipids 4/19.  5.AKI onCKD Stage III: Progressive cardiorenal syndrome, creatinine up to 3.8 at readmission.  - Cr 3.66 -> 3.89 -> 4.11 -> 3.53 - Creatinine down slightly with aggressive diuresis, but overall poor UOP.  - Renal dysfunction in setting of cardio-renal syndrome. - Appreciate Renal input. Not candidate for HD, he is not a candidate for HD.  6. Hypokalemia:  - K 3.0 today. Will supp.  7. PVCs: Frequent.  - Continue amiodarone for VT as below.   8. Deconditioning:  - Continue PT/OT as tolerated.  9. VT  - Had asymptomatic VT on 03/09/18 and started on IV amiodarone. - Has had more VT overnight with increase milrinone. Continue amiodarone. Rebolus as needed.  - K 3.0 will supp.  Recheck Mg.  - Keep K> 4.0 mg > 2.0 10. UTI - Started rocephin 03/11/18 for UTI.  -  UCx + for Serratia Marcescens. Sensitive to  Rocephin, but resistant to Ancef suggestive of AMP-C. Pharmacy recommends broadening to Cefepime.  - BCx NGDT.  11. DNR/DNI  - He is now DNR/DNI.   Patient is critically ill and actively dying from end stage heart failure. Very poor prognosis, likely expect hospital death. Family and patient aware we have no means of escalating his care at this point.   Length of Stay: [address]  Annamaria Helling  03/13/2018 7:16 AM  Advanced Heart Failure Team Pager [phone] (M-F; 7a - 4p)  Please contact [facility] for night-coverage after hours (4p -7a ) and weekends on [email or web address]  Agree with above.  He remains critically ill. Developed respiratory distress over night and given morphine 1mg  and lasix 120IV. He remains on milrinone 0.25. Co-ox remains low. Very poor urine output. CVP 18. Intermittently confused and lethargic.   CVP 18 JVP to ear Cor IRR +s3 Lungs bibasilar crackles dull right bases Ab soft NT Ext 1+ edema  He remains in cardiogenic shock with low co-ox and worsening renal failure despite milrinone support. Will increase milrinone to 0.375 and increase lasix to 120 IV tid. Family realizes he may be nearing death but want to continue to be aggressive if possible. He remains DNR/DNI. Will deactivate ICD. If develops respiratory distress will switch to comfort care.   CRITICAL CARE Performed by: Glori Bickers  Total critical care time: 45 minutes  Critical care time was exclusive of separately billable procedures and treating other patients.  Critical care was necessary to treat or prevent imminent or life-threatening deterioration.  Critical care was time spent personally by me (independent of midlevel providers or residents) on the following activities: development of treatment plan with patient and/or surrogate as well as nursing, discussions with consultants, evaluation of patient's response to treatment, examination of patient, obtaining history from patient or  surrogate, ordering and performing treatments and interventions, ordering and review of laboratory studies, ordering and review of radiographic studies, pulse oximetry and re-evaluation of patient's condition.  Glori Bickers, MD  3:37 PM

## 2018-03-13 NOTE — Progress Notes (Signed)
[facility] ROUNDING NOTE   Subjective:   No complaints this morning.  Was resting comfortably.  Woke and followed commands.  Wondering when he can go home  Blood pressure 119/77 pulse 68 temperature 96.1  Milrinone drip Amiodarone drip  Urine output 700 cc 03/12/2018 weight decreased 99.4  Sodium 127 potassium 3.0 chloride 91 CO2 19 BUN 87 creatinine 3.53 calcium 7.1 WBC 8.9 hemoglobin 12.5 platelets 146 INR 1.95  Renal ultrasound no evidence of hydronephrosis right greater than left renal cortical atrophy and right kidney 11 cm left kidney 11.6 cm  Antibiotics IV Rocephin.  IV Lasix   Objective:  Vital signs in last 24 hours:  Temp:  [96.1 F (35.6 C)-98.2 F (36.8 C)] 96.1 F (35.6 C) (12/24 0756) Pulse Rate:  [54-113] 68 (12/24 0756) Resp:  [16-22] 18 (12/24 0756) BP: (106-143)/(68-92) 119/77 (12/24 0756) SpO2:  [89 %-97 %] 91 % (12/24 0756) FiO2 (%):  [40 %] 40 % (12/23 1022) Weight:  [99.4 kg] 99.4 kg (12/24 0332)  Weight change: -2.1 kg Filed Weights   03/11/18 0300 03/12/18 0300 03/13/18 0332  Weight: 98.5 kg 101.5 kg 99.4 kg    Intake/Output: I/O last 3 completed shifts: In: 1352.6 [P.O.:100; I.V.:798.8; IV Piggyback:453.8] Out: 1100 [Urine:1100]   Intake/Output this shift:  No intake/output data recorded.   Week in bed no respiratory difficulty CVS-regular rate and rhythm no rubs or gallops.  JVP elevated RS-diminished breath sounds at bases otherwise clear ABD- BS present soft non-distended EXT-1+ edema with TED hose   Basic Metabolic Panel: Recent Labs  Lab 03/07/18 0315  03/09/18 0508  03/10/18 0436 03/10/18 1356 03/11/18 0312 03/12/18 0444 03/13/18 0353  NA 129*   < > 128*   < > 127* 124* 124* 124* 127*  K 3.4*   < > 2.8*   < > 3.8 3.6 4.9 4.5 3.0*  CL 86*   < > 83*   < > 82* 80* 81* 80* 91*  CO2 26   < > 27   < > 26 24 24 22  19*  GLUCOSE 161*   < > 168*   < > 254* 344* 265* 283* 154*  BUN 89*   < > 85*   < > 82* 87*  91* 95* 87*  CREATININE 3.43*   < > 3.53*   < > 3.55* 3.66* 3.89* 4.11* 3.53*  CALCIUM 8.4*   < > 8.6*   < > 8.3* 8.5* 8.7* 8.5* 7.1*  MG 2.5*  --  2.4  --   --   --  2.3  --   --   PHOS  --   --   --   --   --  5.0*  --   --   --    < > = values in this interval not displayed.    Liver Function Tests: Recent Labs  Lab 03/08/18 0331 03/10/18 1356  AST 14*  --   ALT 16  --   ALKPHOS 63  --   BILITOT 1.7*  --   PROT 5.9*  --   ALBUMIN 3.4* 3.1*   No results for input(s): LIPASE, AMYLASE in the last 168 hours. No results for input(s): AMMONIA in the last 168 hours.  CBC: Recent Labs  Lab 03/09/18 0508 03/10/18 0436 03/11/18 0312 03/12/18 0444 03/13/18 0353  WBC 5.9 7.8 12.9* 12.9* 8.9  HGB 11.2* 11.9* 11.9* 12.3* 12.5*  HCT 34.0* 35.3* 36.4* 36.3* 37.5*  MCV 88.8 90.5 89.0 89.4 87.4  PLT 176 172 158 153 146*    Cardiac Enzymes: No results for input(s): CKTOTAL, CKMB, CKMBINDEX, TROPONINI in the last 168 hours.  BNP: Invalid input(s): POCBNP  CBG: Recent Labs  Lab 03/11/18 2105 03/12/18 0757 03/12/18 1212 03/12/18 1612 03/12/18 2118  GLUCAP 164* 183* 165* 122* 148*    Microbiology: Results for orders placed or performed during the hospital encounter of 03/19/2018  Culture, Urine     Status: Abnormal (Preliminary result)   Collection Time: 03/11/18  8:09 AM  Result Value Ref Range Status   Specimen Description URINE, RANDOM  Final   Special Requests   Final    NONE Performed at [facility], [place] [address] [place], [place] [address]    Culture >=100,000 COLONIES/mL SERRATIA MARCESCENS (A)  Final   Report Status PENDING  Incomplete  Culture, blood (routine x 2)     Status: None (Preliminary result)   Collection Time: 03/11/18 11:48 AM  Result Value Ref Range Status   Specimen Description BLOOD RIGHT ANTECUBITAL  Final   Special Requests AEROBIC BOTTLE ONLY Blood Culture adequate volume  Final   Culture   Final    NO GROWTH 2 DAYS Performed at  [facility], [place] [address], [place], [place] [address]    Report Status PENDING  Incomplete  Culture, blood (routine x 2)     Status: None (Preliminary result)   Collection Time: 03/11/18 11:49 AM  Result Value Ref Range Status   Specimen Description BLOOD RIGHT HAND  Final   Special Requests AEROBIC BOTTLE ONLY Blood Culture adequate volume  Final   Culture   Final    NO GROWTH 2 DAYS Performed at [facility], [place] [address], [place],  [address]    Report Status PENDING  Incomplete   *Note: Due to a large number of results and/or encounters for the requested time period, some results have not been displayed. A complete set of results can be found in Results Review.    Coagulation Studies: Recent Labs    03/11/18 0312 03/12/18 0444 03/13/18 0353  LABPROT 32.4* 29.4* 22.0*  INR 3.22 2.84 1.95    Urinalysis: Recent Labs    03/10/18 1318  COLORURINE YELLOW  LABSPEC 1.015  PHURINE 5.5  GLUCOSEU NEGATIVE  HGBUR LARGE*  BILIRUBINUR NEGATIVE  KETONESUR NEGATIVE  PROTEINUR 30*  NITRITE NEGATIVE  LEUKOCYTESUR LARGE*      Imaging: No results found.   Medications:   . sodium chloride Stopped (03/09/18 1540)  . amiodarone 30 mg/hr (03/13/18 0600)  . cefTRIAXone (ROCEPHIN)  IV Stopped (03/12/18 1329)  . furosemide Stopped (03/13/18 0514)  . milrinone 0.25 mcg/kg/min (03/13/18 0600)   . feeding supplement (ENSURE ENLIVE)  237 mL Oral BID BM  . insulin aspart  0-15 Units Subcutaneous TID WC  . insulin aspart  0-5 Units Subcutaneous QHS  . insulin aspart  3 Units Subcutaneous TID WC  . ipratropium  2 spray Each Nare Daily  . mouth rinse  15 mL Mouth Rinse BID  . midodrine  10 mg Oral TID WC  . pantoprazole  40 mg Oral Daily  . potassium chloride  40 mEq Oral Once  . sodium chloride flush  10-40 mL Intracatheter Q12H  . sodium chloride flush  3 mL Intravenous Q12H  . Warfarin - Pharmacist Dosing Inpatient   Does not apply q1800   sodium  chloride, acetaminophen, morphine injection, ondansetron (ZOFRAN) IV, polyvinyl alcohol, sodium chloride flush, sodium chloride flush  Assessment/ Plan:   Acute on chronic kidney disease with progressive cardiorenal syndrome.  Creatinine has continued to increase.  Decompression with Foley has failed to lead to an improvement in renal function.  Started Midrin in effort to improve cardiac output and renal perfusion.  Not candidate for dialysis treatment due to multiple medical problems patient now DNR working on quality of life.  Hypotension/volume appears to have significantly improved with Midrin.  Now no longer on antihypertensive medications with discontinuation of Coreg.  Continues on milrinone to maintain co-ox.  Appears to be significantly volume overloaded.  Lasix 120 mg IV every 12 hours administered.  Anemia does not appear to be an issue at this time  Acute on chronic systolic heart failure cardiogenic shock ischemic cardiomyopathy with ejection fraction 35 to 40% on 2D echo in December 2019 Medtronic CRT/D LV lead turned off due to high threshold for a wide QRS.  Lasix reinitiated 120 mg IV every 12 hours will need to coordinate this with cardiology.  It looks like we are moving towards palliative care.  Atrial fibrillation continues on Coumadin anticoagulation amiodarone no longer on carvedilol due to his negative inotropic effect  CAD status post CABG.  No aspirin warfarin and use  Hypokalemia we will give runs of potassium  Hypocalcemia will need to check albumin to see if this corrects.  We will also order magnesium  Hyponatremia secondary to volume overload.  Would avoid metolazone or thiazide type diuretics as this will compound his hyponatremia.  Restarted Lasix per cardiology will need to coordinate if this is the plan.  Serratia marcescens UTI.  IV Rocephin started 03/12/2018  DNR/DNI orders written palliative care help appreciated     LOS: [place] @TODAY @8 :02 AM

## 2018-03-13 NOTE — Progress Notes (Signed)
OT Cancellation Note  Patient Details Name: ADOLPH CLUTTER MRN: [id number] DOB: 08-20-1940   Cancelled Treatment:    Reason Eval/Treat Not Completed: Patient not medically ready(RN request to hold pending possible comfort care) Recommend Palliative at this time.   Richelle Ito, OTR/L  Acute Rehabilitation Services Pager: [phone] Office: [phone] .  03/13/2018, 12:22 PM

## 2018-03-13 NOTE — Progress Notes (Signed)
[place] for warfarin Indication: atrial fibrillation  Allergies  Allergen Reactions  . Avelox [Moxifloxacin Hcl In Nacl] Swelling, Rash and Other (See Comments)    Patient became hypotensive after infusion started Because of a history of documented adverse serious drug reaction;Medi Alert bracelet  is recommended  . Other Rash and Swelling    Patient became hypotensive after infusion started Because of a history of documented adverse serious drug reaction;Medi Alert braceletis recommended  . Penicillins Anaphylaxis, Swelling and Other (See Comments)    Tolerated Ceftriaxone 12/22 / ceftin as an oupt Because of a history of documented adverse serious drug reaction;Medi Alert bracelet  is recommended Has patient had a PCN reaction causing immediate rash, facial/tongue/throat swelling, SOB or lightheadedness with hypotension: Yes Has patient had a PCN reaction causing severe rash involving mucus membranes or skin necrosis: No Has patient had a PCN reaction that required hospitalization: Unknown Has patient had a PCN reaction occurring within the las  . Tape Other (See Comments)    "skin is thin; plastic tape rips skin off when removed; please use paper tape"/spouse    Patient Measurements: Height: 6' (182.9 cm) Weight: 219 lb 2.2 oz (99.4 kg) IBW/kg (Calculated) : 77.6  Vital Signs: Temp: 97.4 F (36.3 C) (12/24 1141) Temp Source: Axillary (12/24 1141) BP: 121/85 (12/24 1141) Pulse Rate: 101 (12/24 1141)  Labs: Recent Labs    03/11/18 0312 03/12/18 0444 03/13/18 0353  HGB 11.9* 12.3* 12.5*  HCT 36.4* 36.3* 37.5*  PLT 158 153 146*  LABPROT 32.4* 29.4* 22.0*  INR 3.22 2.84 1.95  CREATININE 3.89* 4.11* 3.53*    Estimated Creatinine Clearance: 21.7 mL/min (A) (by C-G formula based on SCr of 3.53 mg/dL (H)).   Medical History: Past Medical History:  Diagnosis Date  . AAA (abdominal aortic aneurysm) ([place])    Surgical repair  11/2002.  . Adenomatous colon polyp   . AICD (automatic cardioverter/defibrillator) present   . Alcohol ingestion of more than four drinks per week    Excess beer  not a dependency problem  . Aortic valve sclerosis   . Arthritis    "all over" (03/04/2018)  . Atrial fibrillation ([place])    Previous long-term amiodarone therapy with multiple cardioversions / amiodarone stopped September, 2009  . Atrial flutter [facility])    Started November, 2010, Left-sided and cannot ablate  . Bony abnormality    Patient's manubrium is slightly displaced to the right  . CAD (coronary artery disease)    a. s/p CABG 2004;  b. 02/2016 Cath: LM nl, LAD 70p, 189m, D1 50, D2 50, RI 50ost, LCX 18m, OM2 100, OM3 100, RCA 70p, 100d, VG->OM3 ok, LIMA->LAD 60ost, VG->RPDA  ok.  Marland Kitchen CAD (coronary artery disease)    Doppler, December, 2013, 0-39% bilateral  . 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 01/2013 MDT DTBA 1D1 [place] [employer] CRT-D, ser # [id number] H.  . Chronic back pain   . Chronic systolic CHF (congestive heart failure) ([place])    a. 02/2016 Echo: EF 35-40%, diff HK, triv AI, mildly dil Ao root 72mm, mild MR, sev dil LA, triv TR.  Marland Kitchen Chronotropic incompetence    IV pacing rate adjusted  . CKD (chronic kidney disease), stage III ([place])   . COPD (chronic obstructive pulmonary disease) ([place])   . Dilated aortic root ([place])   . Discolored skin   . Diverticulosis   . Drug therapy    Redness and swelling with Avelox infusion  May 24, 2011  . Dvt femoral (deep venous thrombosis) (HCC) <2002  . Dyspnea   . Eye abnormality    Ophthalmologist questions a clot in one of his eyes, May, 2012  . GERD (gastroesophageal reflux disease)   . Gout   . Gout    "on daily RX" (02/23/2018)  . Hyperlipidemia   . Hypertension   . Internal hemorrhoids   . Ischemic cardiomyopathy   . Left atrial thrombus    Remote past... cardioversions done since that time  . Mitral regurgitation    Mild echo  . Myocardial  infarction ([place]) ~ 2002  . Nasal drainage    Chronic  . On home oxygen therapy    "2L prn; ~  4h/day" (03/16/2018)  . Overweight(278.02)   . Pericardial effusion   . Pleural effusion    Large loculated effusion on the left side November, 2011. This was tapped. It was exudative. Cytology revealed no cancer no proof of mesothelioma area pulmonary team felt that no further workup was needed  . Pleural thickening   . Pneumonia    "several times" (03/13/2018)  . 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 Presence of permanent cardiac pacemaker   . SOB (shortness of breath)    Large left effusion/ thoracentesis/hospitalization/November, 2011... exudated.. cytology negative.. Dr.Wert.. no proof of mesothelioma  . Spinal stenosis    Surgery Dr.Elsner  . Thrombophlebitis of superficial veins of upper extremities    Possible venous stenosis from defibrillator  . Type II diabetes mellitus ([place]) 2008  . Venous insufficiency    Toe discoloration chronic  . Ventral hernia    April, 2014, result of his abdominal surgery  . Warfarin anticoagulation   . Wide-complex tachycardia (HCC)     Assessment: 65 yoM on warfarin PTA for hx of PAF. INR on admit was 4.7 and bumped to up above 5 despite holding warfarin.   INR Now trending down 1.9 -  Will restart warfarin today (last dose 12/15 at home). CBC stable, LFTs wnl.   *PTA Warfarin Dose = 2.5mg  daily except 5mg  Tues  12/22 UCX with serratia R ancef S to rocephin but risk for ampC producing resistance will change to cefepime.  Goal of Therapy:  INR 2-3 Monitor platelets by anticoagulation protocol: Yes   Plan:  -warfarin 2.5mg  x1 today -Monitor daily INR, CBC, s/sx bleeding - stop rocephin - cefepime 1gm iv q24h  Bonnita Nasuti Pharm.D. CPP, BCPS Clinical Pharmacist [phone] 03/13/2018 2:43 PM

## 2018-03-14 DIAGNOSIS — N179 Acute kidney failure, unspecified: Secondary | ICD-10-CM

## 2018-03-14 DIAGNOSIS — N19 Unspecified kidney failure: Secondary | ICD-10-CM

## 2018-03-14 DIAGNOSIS — R570 Cardiogenic shock: Secondary | ICD-10-CM

## 2018-03-14 LAB — GLUCOSE, CAPILLARY
Glucose-Capillary: 157 mg/dL — ABNORMAL HIGH (ref 70–99)
Glucose-Capillary: 176 mg/dL — ABNORMAL HIGH (ref 70–99)
Glucose-Capillary: 169 mg/dL — ABNORMAL HIGH (ref 70–99)
Glucose-Capillary: 149 mg/dL — ABNORMAL HIGH (ref 70–99)

## 2018-03-14 LAB — COOXEMETRY PANEL
Carboxyhemoglobin: 1.3 % (ref 0.5–1.5)
Methemoglobin: 1.2 % (ref 0.0–1.5)
O2 Saturation: 54.7 %
Total hemoglobin: 12.5 g/dL (ref 12.0–16.0)

## 2018-03-14 LAB — CBC
HCT: 36.4 % — ABNORMAL LOW (ref 39.0–52.0)
Hemoglobin: 11.8 g/dL — ABNORMAL LOW (ref 13.0–17.0)
MCH: 28.7 pg (ref 26.0–34.0)
MCHC: 32.4 g/dL (ref 30.0–36.0)
MCV: 88.6 fL (ref 80.0–100.0)
NRBC: 0 % (ref 0.0–0.2)
Platelets: 143 10*3/uL — ABNORMAL LOW (ref 150–400)
RBC: 4.11 MIL/uL — ABNORMAL LOW (ref 4.22–5.81)
RDW: 15 % (ref 11.5–15.5)
WBC: 9 10*3/uL (ref 4.0–10.5)

## 2018-03-14 LAB — RENAL FUNCTION PANEL
Albumin: 3.2 g/dL — ABNORMAL LOW (ref 3.5–5.0)
Anion gap: 17 — ABNORMAL HIGH (ref 5–15)
BUN: 105 mg/dL — AB (ref 8–23)
CO2: 24 mmol/L (ref 22–32)
Calcium: 8.8 mg/dL — ABNORMAL LOW (ref 8.9–10.3)
Chloride: 82 mmol/L — ABNORMAL LOW (ref 98–111)
Creatinine, Ser: 4.76 mg/dL — ABNORMAL HIGH (ref 0.61–1.24)
GFR calc Af Amer: 13 mL/min — ABNORMAL LOW (ref >60)
GFR calc non Af Amer: 11 mL/min — ABNORMAL LOW (ref >60)
Glucose, Bld: 154 mg/dL — ABNORMAL HIGH (ref 70–99)
Phosphorus: 6.9 mg/dL — ABNORMAL HIGH (ref 2.5–4.6)
Potassium: 4.4 mmol/L (ref 3.5–5.1)
SODIUM: 123 mmol/L — AB (ref 135–145)

## 2018-03-14 LAB — PROTIME-INR
INR: 2.05
Prothrombin Time: 22.8 seconds — ABNORMAL HIGH (ref 11.4–15.2)

## 2018-03-14 MED ORDER — MORPHINE 100MG IN NS 100ML (1MG/ML) PREMIX INFUSION
1.0000 mg/h | INTRAVENOUS | Status: DC
  Administered 2018-03-14: 1 mg/h via INTRAVENOUS
  Filled 2018-03-14: qty 100

## 2018-03-14 MED ORDER — WARFARIN SODIUM 2 MG PO TABS
2.0000 mg | ORAL_TABLET | Freq: Once | ORAL | Status: AC
  Administered 2018-03-14: 2 mg via ORAL

## 2018-03-15 ENCOUNTER — Encounter: Payer: Self-pay | Admitting: Hematology & Oncology

## 2018-03-15 NOTE — Progress Notes (Signed)
Patient deceased on 03/15/2018 at 2350/07/02 with spouse at bedside.. Death was pronounced by 2 RNs, Donna Christen and Nelson. Dr Haroldine Laws notified.

## 2018-03-16 LAB — CULTURE, BLOOD (ROUTINE X 2)
Culture: NO GROWTH
Culture: NO GROWTH
Special Requests: ADEQUATE
Special Requests: ADEQUATE

## 2018-03-21 NOTE — Progress Notes (Signed)
[facility] ROUNDING NOTE   Subjective:   Resting comfortably in bed.  More difficult to arouse this morning from sleep  Blood pressure 112/83 pulse 109 temperature 97.5 O2 sats 97% room air  Milrinone drip Amiodarone drip  Urine output 1075 cc 03/13/2018 weight 99.4  Sodium 123 potassium 4.4 chloride 82 CO2 24 BUN 105 creatinine 4.76 glucose 154 calcium 8.8 phosphorus 6.9 albumin 3.2 WBC 9.0 hemoglobin 11.8 platelets 143  Oxygen saturation 54.7  Renal ultrasound no evidence of hydronephrosis right greater than left renal cortical atrophy and right kidney 11 cm left kidney 11.6 cm  Antibiotics IV Rocephin.  Urine culture Serratia marcescens 03/11/2018  IV Lasix   Objective:  Vital signs in last 24 hours:  Temp:  [97.3 F (36.3 C)-98.3 F (36.8 C)] 97.4 F (36.3 C) (12/25 0754) Pulse Rate:  [101-113] 109 (12/25 0754) Resp:  [12-28] 28 (12/25 0754) BP: (95-128)/(47-91) 112/83 (12/25 0754) SpO2:  [90 %-97 %] 97 % (12/25 0754)  Weight change:  Filed Weights   03/11/18 0300 03/12/18 0300 03/13/18 0332  Weight: 98.5 kg 101.5 kg 99.4 kg    Intake/Output: I/O last 3 completed shifts: In: 1822.2 [P.O.:360; I.V.:964.8; IV Piggyback:497.4] Out: 1175 [YFVCB:4496]   Intake/Output this shift:  No intake/output data recorded.   Week in bed no respiratory difficulty CVS-regular rate and rhythm no rubs or gallops.  JVP elevated RS-diminished breath sounds at bases otherwise clear ABD- BS present soft non-distended EXT-1+ edema with TED hose   Basic Metabolic Panel: Recent Labs  Lab 03/09/18 0508  03/10/18 1356 03/11/18 0312 03/12/18 0444 03/13/18 0353 03/13/18 1417 2018-04-09 0500  NA 128*   < > 124* 124* 124* 127* 123* 123*  K 2.8*   < > 3.6 4.9 4.5 3.0* 4.3 4.4  CL 83*   < > 80* 81* 80* 91* 83* 82*  CO2 27   < > 24 24 22  19* 23 24  GLUCOSE 168*   < > 344* 265* 283* 154* 165* 154*  BUN 85*   < > 87* 91* 95* 87* 102* 105*  CREATININE 3.53*   < >  3.66* 3.89* 4.11* 3.53* 4.56* 4.76*  CALCIUM 8.6*   < > 8.5* 8.7* 8.5* 7.1* 8.8* 8.8*  MG 2.4  --   --  2.3  --  1.9  --   --   PHOS  --   --  5.0*  --   --   --   --  6.9*   < > = values in this interval not displayed.    Liver Function Tests: Recent Labs  Lab 03/08/18 0331 03/10/18 1356 2018-04-09 0500  AST 14*  --   --   ALT 16  --   --   ALKPHOS 63  --   --   BILITOT 1.7*  --   --   PROT 5.9*  --   --   ALBUMIN 3.4* 3.1* 3.2*   No results for input(s): LIPASE, AMYLASE in the last 168 hours. No results for input(s): AMMONIA in the last 168 hours.  CBC: Recent Labs  Lab 03/10/18 0436 03/11/18 0312 03/12/18 0444 03/13/18 0353 Apr 09, 2018 0500  WBC 7.8 12.9* 12.9* 8.9 9.0  HGB 11.9* 11.9* 12.3* 12.5* 11.8*  HCT 35.3* 36.4* 36.3* 37.5* 36.4*  MCV 90.5 89.0 89.4 87.4 88.6  PLT 172 158 153 146* 143*    Cardiac Enzymes: No results for input(s): CKTOTAL, CKMB, CKMBINDEX, TROPONINI in the last 168 hours.  BNP: Invalid input(s): POCBNP  CBG: Recent Labs  Lab 03/12/18 2118 03/13/18 0800 03/13/18 1140 03/13/18 1716 03/13/18 2214  GLUCAP 148* 157* 173* 161* 127*    Microbiology: Results for orders placed or performed during the hospital encounter of 02/19/2018  Culture, Urine     Status: Abnormal   Collection Time: 03/11/18  8:09 AM  Result Value Ref Range Status   Specimen Description URINE, RANDOM  Final   Special Requests   Final    NONE Performed at [facility], [place] [address], [place], [place] [address]    Culture >=100,000 COLONIES/mL SERRATIA MARCESCENS (A)  Final   Report Status 03/13/2018 FINAL  Final   Organism ID, Bacteria SERRATIA MARCESCENS (A)  Final      Susceptibility   Serratia marcescens - MIC*    CEFAZOLIN >=64 RESISTANT Resistant     CEFTRIAXONE <=1 SENSITIVE Sensitive     CIPROFLOXACIN <=0.25 SENSITIVE Sensitive     GENTAMICIN <=1 SENSITIVE Sensitive     NITROFURANTOIN 128 RESISTANT Resistant     TRIMETH/SULFA <=20 SENSITIVE  Sensitive     * >=100,000 COLONIES/mL SERRATIA MARCESCENS  Culture, blood (routine x 2)     Status: None (Preliminary result)   Collection Time: 03/11/18 11:48 AM  Result Value Ref Range Status   Specimen Description BLOOD RIGHT ANTECUBITAL  Final   Special Requests AEROBIC BOTTLE ONLY Blood Culture adequate volume  Final   Culture NO GROWTH 3 DAYS  Final   Report Status PENDING  Incomplete  Culture, blood (routine x 2)     Status: None (Preliminary result)   Collection Time: 03/11/18 11:49 AM  Result Value Ref Range Status   Specimen Description BLOOD RIGHT HAND  Final   Special Requests AEROBIC BOTTLE ONLY Blood Culture adequate volume  Final   Culture NO GROWTH 3 DAYS  Final   Report Status PENDING  Incomplete   *Note: Due to a large number of results and/or encounters for the requested time period, some results have not been displayed. A complete set of results can be found in Results Review.    Coagulation Studies: Recent Labs    03/12/18 0444 03/13/18 0353 03/17/2018 0612  LABPROT 29.4* 22.0* 22.8*  INR 2.84 1.95 2.05    Urinalysis: No results for input(s): COLORURINE, LABSPEC, PHURINE, GLUCOSEU, HGBUR, BILIRUBINUR, KETONESUR, PROTEINUR, UROBILINOGEN, NITRITE, LEUKOCYTESUR in the last 72 hours.  Invalid input(s): APPERANCEUR    Imaging: No results found.   Medications:   . sodium chloride Stopped (03/09/18 1540)  . amiodarone 30 mg/hr (2018-03-17 0500)  . ceFEPime (MAXIPIME) IV 1 g (03/13/18 1003)  . furosemide 120 mg (03/13/18 2310)  . milrinone 0.375 mcg/kg/min (2018/03/17 0545)   . feeding supplement (ENSURE ENLIVE)  237 mL Oral BID BM  . insulin aspart  0-15 Units Subcutaneous TID WC  . insulin aspart  0-5 Units Subcutaneous QHS  . insulin aspart  3 Units Subcutaneous TID WC  . ipratropium  2 spray Each Nare Daily  . mouth rinse  15 mL Mouth Rinse BID  . midodrine  10 mg Oral TID WC  . pantoprazole  40 mg Oral Daily  . sodium chloride flush  10-40 mL  Intracatheter Q12H  . sodium chloride flush  3 mL Intravenous Q12H  . Warfarin - Pharmacist Dosing Inpatient   Does not apply q1800   sodium chloride, acetaminophen, morphine injection, ondansetron (ZOFRAN) IV, polyvinyl alcohol, sodium chloride flush, sodium chloride flush  Assessment/ Plan:   Acute on chronic kidney disease with progressive cardiorenal  syndrome.  Creatinine has continued to increase.  Decompression with Foley has failed to lead to an improvement in renal function.  Started Midrin in effort to improve cardiac output and renal perfusion.  Not candidate for dialysis treatment due to multiple medical problems patient now DNR working on quality of life.  Hypotension/volume appears to have significantly improved with Midrin.  Now no longer on antihypertensive medications with discontinuation of Coreg.  Continues on milrinone to maintain co-ox.  Appears to be still significantly volume overloaded.  Lasix 120 mg IV every 12 hours administered.  Anemia does not appear to be an issue at this time  Acute on chronic systolic heart failure cardiogenic shock ischemic cardiomyopathy with ejection fraction 35 to 40% on 2D echo in December 2019 Medtronic CRT/D LV lead turned off due to high threshold for a wide QRS.  Lasix reinitiated 120 mg IV every 12 hours will need to coordinate this with cardiology.  It looks like we are moving towards palliative care.  Atrial fibrillation continues on Coumadin anticoagulation amiodarone no longer on carvedilol due to his negative inotropic effect  CAD status post CABG.  No aspirin warfarin and use  Hypokalemia we will give runs of potassium  Hypocalcemia will need to check albumin to see if this corrects.  We will also order magnesium  Hyponatremia secondary to volume overload.  Would avoid metolazone or thiazide type diuretics as this will compound his hyponatremia.  Restarted Lasix per cardiology will need to coordinate if this is the  plan.  Serratia marcescens UTI.  IV Rocephin started 03/12/2018  DNR/DNI orders written palliative care help appreciated.  Would work on hospice palliative care.     LOS: [place] @TODAY @8 :03 AM

## 2018-03-21 NOTE — Progress Notes (Signed)
MD notified that patient has declined and lost respirations however heart rate in 50's pacing. Anticipating that pacemaker will stop soon then we will pronounce time of death. Wife is at bedside I explained to her and answered her question and provided emotional support.

## 2018-03-21 NOTE — Progress Notes (Signed)
[name] for warfarin Indication: atrial fibrillation  Allergies  Allergen Reactions  . Avelox [Moxifloxacin Hcl In Nacl] Swelling, Rash and Other (See Comments)    Patient became hypotensive after infusion started Because of a history of documented adverse serious drug reaction;Medi Alert bracelet  is recommended  . Other Rash and Swelling    Patient became hypotensive after infusion started Because of a history of documented adverse serious drug reaction;Medi Alert braceletis recommended  . Penicillins Anaphylaxis, Swelling and Other (See Comments)    Tolerated Ceftriaxone 12/22 / ceftin as an oupt Because of a history of documented adverse serious drug reaction;Medi Alert bracelet  is recommended Has patient had a PCN reaction causing immediate rash, facial/tongue/throat swelling, SOB or lightheadedness with hypotension: Yes Has patient had a PCN reaction causing severe rash involving mucus membranes or skin necrosis: No Has patient had a PCN reaction that required hospitalization: Unknown Has patient had a PCN reaction occurring within the las  . Tape Other (See Comments)    "skin is thin; plastic tape rips skin off when removed; please use paper tape"/spouse    Patient Measurements: Height: 6' (182.9 cm) Weight: 219 lb 2.2 oz (99.4 kg) IBW/kg (Calculated) : 77.6  Vital Signs: Temp: 96.1 F (35.6 C) (12/25 1123) Temp Source: Axillary (12/25 1123) BP: 118/66 (12/25 1123) Pulse Rate: 95 (12/25 1123)  Labs: Recent Labs    03/12/18 0444 03/13/18 0353 03/13/18 1417 20-Mar-2018 0500 03/20/18 0612  HGB 12.3* 12.5*  --  11.8*  --   HCT 36.3* 37.5*  --  36.4*  --   PLT 153 146*  --  143*  --   LABPROT 29.4* 22.0*  --   --  22.8*  INR 2.84 1.95  --   --  2.05  CREATININE 4.11* 3.53* 4.56* 4.76*  --     Estimated Creatinine Clearance: 16.1 mL/min (A) (by C-G formula based on SCr of 4.76 mg/dL (H)).   Medical History: Past Medical  History:  Diagnosis Date  . AAA (abdominal aortic aneurysm) ([place])    Surgical repair 11/2002.  . Adenomatous colon polyp   . AICD (automatic cardioverter/defibrillator) present   . Alcohol ingestion of more than four drinks per week    Excess beer  not a dependency problem  . Aortic valve sclerosis   . Arthritis    "all over" (03/18/2018)  . Atrial fibrillation ([place])    Previous long-term amiodarone therapy with multiple cardioversions / amiodarone stopped September, 2009  . Atrial flutter [facility])    Started November, 2010, Left-sided and cannot ablate  . Bony abnormality    Patient's manubrium is slightly displaced to the right  . CAD (coronary artery disease)    a. s/p CABG 2004;  b. 02/2016 Cath: LM nl, LAD 70p, 133m, D1 50, D2 50, RI 50ost, LCX 35m, OM2 100, OM3 100, RCA 70p, 100d, VG->OM3 ok, LIMA->LAD 60ost, VG->RPDA  ok.  Marland Kitchen CAD (coronary artery disease)    Doppler, December, 2013, 0-39% bilateral  . 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 01/2013 MDT DTBA 1D1 [place] [employer] CRT-D, ser # [id number] H.  . Chronic back pain   . Chronic systolic CHF (congestive heart failure) ([place])    a. 02/2016 Echo: EF 35-40%, diff HK, triv AI, mildly dil Ao root 69mm, mild MR, sev dil LA, triv TR.  Marland Kitchen Chronotropic incompetence    IV pacing rate adjusted  . CKD (chronic kidney disease), stage III ([place])   .  COPD (chronic obstructive pulmonary disease) ([place])   . Dilated aortic root ([place])   . Discolored skin   . Diverticulosis   . Drug therapy    Redness and swelling with Avelox infusion May 24, 2011  . Dvt femoral (deep venous thrombosis) (HCC) <2002  . Dyspnea   . Eye abnormality    Ophthalmologist questions a clot in one of his eyes, May, 2012  . GERD (gastroesophageal reflux disease)   . Gout   . Gout    "on daily RX" (02/22/2018)  . Hyperlipidemia   . Hypertension   . Internal hemorrhoids   . Ischemic cardiomyopathy   . Left atrial thrombus    Remote past...  cardioversions done since that time  . Mitral regurgitation    Mild echo  . Myocardial infarction ([place]) ~ 2002  . Nasal drainage    Chronic  . On home oxygen therapy    "2L prn; ~  4h/day" (02/27/2018)  . Overweight(278.02)   . Pericardial effusion   . Pleural effusion    Large loculated effusion on the left side November, 2011. This was tapped. It was exudative. Cytology revealed no cancer no proof of mesothelioma area pulmonary team felt that no further workup was needed  . Pleural thickening   . Pneumonia    "several times" (03/12/2018)  . 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 Presence of permanent cardiac pacemaker   . SOB (shortness of breath)    Large left effusion/ thoracentesis/hospitalization/November, 2011... exudated.. cytology negative.. Dr.Wert.. no proof of mesothelioma  . Spinal stenosis    Surgery Dr.Elsner  . Thrombophlebitis of superficial veins of upper extremities    Possible venous stenosis from defibrillator  . Type II diabetes mellitus ([place]) 2008  . Venous insufficiency    Toe discoloration chronic  . Ventral hernia    April, 2014, result of his abdominal surgery  . Warfarin anticoagulation   . Wide-complex tachycardia (HCC)     Assessment: 72 yoM on warfarin PTA for hx of PAF. INR on admit was 4.7 and bumped to up above 5 despite holding warfarin.   INR at 2.05 today from 1.95 after restarting warfarin 2.5 mg last night (prior last dose on 12/15). Hgb 11.8, plt 143. No s/sx of bleed documented. at home). LFTs wnl. On concurrent cefepime and amiodarone, can impact warfarin sensitivity.   *PTA Warfarin Dose = 2.5mg  daily except 5mg  Tues  Goal of Therapy:  INR 2-3 Monitor platelets by anticoagulation protocol: Yes   Plan:  -Warfarin 2 mg x1 today -Monitor daily INR, CBC, s/sx bleeding  Antonietta [name], PharmD, [place] Clinical Pharmacist  Pager: [phone] Phone: [phone] 03-23-18 12:51 PM

## 2018-03-21 NOTE — Death Summary Note (Addendum)
  Advanced Heart Failure Death Summary  Death Summary   Patient ID: [name] MRN: [id number], DOB/AGE: [birth year] 77 y.o. Admit date: 03/02/2018 D/C date:     03-29-18   Primary Discharge Diagnoses:  1.Acute on chronic systolic CHF -> cardiogenic shock:  2. 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 Atrial fibrillation: Permanent  5. CAD: s/p CABG.   6. VT  7. UTI 8. DNR/DNI   Hospital Course:   [name] is a 78 y.o. male with history of CAD s/p CABG, permanent atrial fib, S/P AAA repair, ischemic cardiomyopathy/chronic systolic heart failure, Medtronic CRT-D system.   Admitted from HF clinic 02/22/2018 with volume overload and AKI. Pt started on milrinone initially and IV diuretics, but with poor response. His mixed venous saturation initially improved on milrinone, but he had poor urine output. IV lasix increase with mildly improved response.    On milrinone, pt had asymptomatic VT on 03/09/18. Milrinone was stopped and IV amiodarone was added. With worsening renal function and depressed mixed venous saturation, milrinone was restarted over the weekend, and increased throughout the beginning of the week.   Pt started on Rocephin for UTI 03/11/18 after UA due to Urinary retention. UCx + for Serratia Marcescens, switched to Cefepime once sensitivities resulted.  Nephrology had been consulted 03/09/18 with worsening renal function, and determined patient to be poor candidate for HD. Pt had gradual worsening with worsening orthopnea and uremia at night, preventing sleep. Long discussions had with the family that the patient was clearly in the end stages of his HF with multiple system organ failure and very unlikely to survive this admission. He was given prn morphine for comfort, but initially did not want any additional comfort measures in order to spend remaining time with his family, alert.   With progressive uremia and worsening  discomfort, pt was started on low dose morphine gtt am of March 29, 2018 per family and patient request. Pt continued to decline and ultimately passed away at [place] with spouse at bedside.   Every effort was made during this and previous admissions to improve this patients clinical picture. 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   Duration of Discharge Encounter: Greater than 35 minutes   Signed, Shirley Friar, PA-C 03/15/2018, 7:17 AM   Agree with above.   Glori Bickers, MD  7:11 PM

## 2018-03-21 NOTE — Progress Notes (Signed)
Patient ID: [name], male   DOB: July 18, 1940, 78 y.o.   MRN: [id number]     Advanced Heart Failure Rounding Note  PCP-Cardiologist: No primary care provider on file.   Subjective:    Now on milrinone 0.375 (increased on 12/24) and lasix 120 iv tid (also increased 12/24)   Co-ox up to  55% but more lethargic overall Cr 4.56-> 4.76. BUN 105  Says he is miserable. Has never felt this way before. "In a bubble" Denies pain or SOB. Minimal urine output.   CVP 14-15   . UCx: Serratia Marcescens - Rocephin started 03/11/18  Objective:   Weight Range: 99.4 kg Body mass index is 29.72 kg/m.   Vital Signs:   Temp:  [97.3 F (36.3 C)-98.3 F (36.8 C)] 97.4 F (36.3 C) (12/25 0754) Pulse Rate:  [101-113] 109 (12/25 0754) Resp:  [12-28] 28 (12/25 0754) BP: (95-128)/(47-91) 112/83 (12/25 0754) SpO2:  [90 %-97 %] 97 % (12/25 0754) Last BM Date: 03/11/18  Weight change: Filed Weights   03/11/18 0300 03/12/18 0300 03/13/18 0332  Weight: 98.5 kg 101.5 kg 99.4 kg   Intake/Output:   Intake/Output Summary (Last 24 hours) at 2018-03-24 0847 Last data filed at 03/24/2018 0500 Gross per 24 hour  Intake 1067.04 ml  Output 675 ml  Net 392.04 ml    Physical Exam   General:  Elderly male. Chronically ill appearing. Restless. Falls asleep easily  HEENT: normal Neck: supple. JVP to ear Carotids 2+ bilat; no bruits. No lymphadenopathy or thryomegaly appreciated. Cor: PMI nondisplaced. Irregular rate & rhythm. +s3 Lungs: clear Abdomen: soft, nontender, nondistended. No hepatosplenomegaly. No bruits or masses. Good bowel sounds. Extremities: no cyanosis, clubbing, rash, 2+ edema into thighs Neuro: Restless. Falls asleep easily Knows what day it is   Telemetry   Afib 90s with PVCs, personally reviewed.   EKG   No new tracings.    Labs    CBC Recent Labs    03/13/18 0353 Mar 24, 2018 0500  WBC 8.9 9.0  HGB 12.5* 11.8*  HCT 37.5* 36.4*  MCV 87.4 88.6  PLT 146* 143*    Basic Metabolic Panel Recent Labs    03/13/18 0353 03/13/18 1417 24-Mar-2018 0500  NA 127* 123* 123*  K 3.0* 4.3 4.4  CL 91* 83* 82*  CO2 19* 23 24  GLUCOSE 154* 165* 154*  BUN 87* 102* 105*  CREATININE 3.53* 4.56* 4.76*  CALCIUM 7.1* 8.8* 8.8*  MG 1.9  --   --   PHOS  --   --  6.9*   Liver Function Tests Recent Labs    March 24, 2018 0500  ALBUMIN 3.2*   No results for input(s): LIPASE, AMYLASE in the last 72 hours. Cardiac Enzymes No results for input(s): CKTOTAL, CKMB, CKMBINDEX, TROPONINI in the last 72 hours.  BNP: BNP (last 3 results) Recent Labs    12/29/17 1048 02/22/18 1215  BNP 1,274.1* 2,025.5*    ProBNP (last 3 results) No results for input(s): PROBNP in the last 8760 hours.   D-Dimer No results for input(s): DDIMER in the last 72 hours. Hemoglobin A1C No results for input(s): HGBA1C in the last 72 hours. Fasting Lipid Panel No results for input(s): CHOL, HDL, LDLCALC, TRIG, CHOLHDL, LDLDIRECT in the last 72 hours. Thyroid Function Tests No results for input(s): TSH, T4TOTAL, T3FREE, THYROIDAB in the last 72 hours.  Invalid input(s): FREET3  Other results:   Imaging    No results found.   Medications:     Scheduled Medications: .  feeding supplement (ENSURE ENLIVE)  237 mL Oral BID BM  . insulin aspart  0-15 Units Subcutaneous TID WC  . insulin aspart  0-5 Units Subcutaneous QHS  . insulin aspart  3 Units Subcutaneous TID WC  . ipratropium  2 spray Each Nare Daily  . mouth rinse  15 mL Mouth Rinse BID  . midodrine  10 mg Oral TID WC  . pantoprazole  40 mg Oral Daily  . sodium chloride flush  10-40 mL Intracatheter Q12H  . sodium chloride flush  3 mL Intravenous Q12H  . Warfarin - Pharmacist Dosing Inpatient   Does not apply q1800    Infusions: . sodium chloride Stopped (03/09/18 1540)  . amiodarone 30 mg/hr (March 22, 2018 0500)  . ceFEPime (MAXIPIME) IV 1 g (03/13/18 1003)  . furosemide 120 mg (03/13/18 2310)  . milrinone 0.375  mcg/kg/min (Mar 22, 2018 0545)    PRN Medications: sodium chloride, acetaminophen, morphine injection, ondansetron (ZOFRAN) IV, polyvinyl alcohol, sodium chloride flush, sodium chloride flush    Patient Profile   [name] is a 78 y.o. male  with history of CAD s/p CABG, permanent atrial fib, S/P AAA repair, ischemic cardiomyopathy/chronic systolic heart failure,  [employer] CRT-D system.   Admitted from HF clinic 03/10/2018 with volume overload and AKI.   Assessment/Plan   1.Acute on chronic systolic CHF -> cardiogenic shock: Ischemic cardiomyopathy. 02/2018 Echo, EF 35-40%, stable. Medtronic CRT-D but LV lead turned off due to high threshold. Has a very wide QRS complex.  11/20109 Unable to place epicardial lead surgically. Patient was admitted earlier in 12/19 after Reserve on 02/23/18 showed low output with CI 1.9 and elevated filling pressures. Creatinine was elevated to the 2.9 range.   He was started on milrinone and diuresed.  Milrinone weaned and he was sent home.  Admitted again with worsening SOB and recurrent worsening renal failure. Was on milrinone 0.25 with improved co-ox. Milrinone stopped 12/20 due to VT. Milrinone restarted.  - He has end-stage HF with cardio-renal syndrome. Now DNR/DNI - Co-ox now up to 55% on milrinone 0.375 mcg/kg/min but renal function continues to worsen this am.  Poor response to increased IV lasix. CVP 14-15 this am - Ongoing discussions with his wife and son. Again reiterated that he has end-stage HF with multi-system organ failure and he is unlikely to survive this admit. He is now more uremic today. Despite the patient saying he feels miserable and being clearly uremic his wife insists he is more alert and doing better. I told her that we cannot let him suffer too much and we have agreed to start low-dose morphine gtt to take the edge off for him. Can titrate up or down as situation dictates  - No ACEI/ARB/ARNI/spironolactone with AKI.   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 Not candidate for HD, he is not a candidate for HD.   3. Atrial fibrillation: Permanent  - Coumadin restarted Discussed dosing with PharmD personally. - Rate controlled on IV  amio   4. CAD: s/p CABG.   - No s/s of ischemia.    - No ASA given warfarin use.  - Continue statin. Good lipids 4/19.   5. VT  - Had asymptomatic VT on 03/09/18 and started on IV amiodarone. - ICD now off - Continue IV amio - Keep K> 4.0 mg > 2.0  6. UTI - Started rocephin  03/11/18 for UTI.  -  UCx + for Serratia Marcescens. Sensitive to Rocephin, but resistant to Ancef suggestive of AMP-C. Pharmacy recommends broadening to Cefepime.  - BCx NGDT.   7. DNR/DNI  - He is now DNR/DNI. ICD off  Patient is critically ill and actively dying from end stage heart failure. Very poor prognosis, likely expect hospital death. Family and patient aware we have no means of escalating his care at this point.   Length of Stay: E[place], MD  15-Mar-2018 8:47 AM  Advanced Heart Failure Team Pager [phone] (M-F; 7a - 4p)  Please contact [facility] for night-coverage after hours (4p -7a ) and weekends on [email or web address]

## 2018-03-21 DEATH — deceased

## 2018-03-28 ENCOUNTER — Ambulatory Visit: Payer: Medicare Other | Admitting: Surgery

## 2018-04-01 LAB — CUP PACEART REMOTE DEVICE CHECK
Battery Remaining Longevity: 35 mo
Battery Voltage: 2.96 V
Brady Statistic AP VP Percent: 0 %
Brady Statistic AP VS Percent: 0 %
Brady Statistic AS VP Percent: 23.02 %
Brady Statistic AS VS Percent: 76.98 %
Brady Statistic RA Percent Paced: 0 %
Brady Statistic RV Percent Paced: 23.82 %
Date Time Interrogation Session: 20191112152601
HIGH POWER IMPEDANCE MEASURED VALUE: 72 Ohm
Implantable Lead Implant Date: 20090121
Implantable Lead Implant Date: 20090121
Implantable Lead Implant Date: 20090121
Implantable Lead Location: 753858
Implantable Lead Location: 753859
Implantable Lead Location: 753860
Implantable Lead Model: 4193
Implantable Lead Model: 5076
Implantable Lead Model: 6935
Implantable Pulse Generator Implant Date: 20141124
Lead Channel Impedance Value: 380 Ohm
Lead Channel Impedance Value: 4047 Ohm
Lead Channel Impedance Value: 4047 Ohm
Lead Channel Impedance Value: 437 Ohm
Lead Channel Impedance Value: 437 Ohm
Lead Channel Impedance Value: 494 Ohm
Lead Channel Pacing Threshold Pulse Width: 0.4 ms
Lead Channel Sensing Intrinsic Amplitude: 12.5 mV
Lead Channel Sensing Intrinsic Amplitude: 12.5 mV
Lead Channel Sensing Intrinsic Amplitude: 3.75 mV
Lead Channel Sensing Intrinsic Amplitude: 3.75 mV
Lead Channel Setting Sensing Sensitivity: 0.3 mV
MDC IDC MSMT LEADCHNL RV PACING THRESHOLD AMPLITUDE: 0.625 V
MDC IDC SET LEADCHNL RV PACING AMPLITUDE: 2.5 V
MDC IDC SET LEADCHNL RV PACING PULSEWIDTH: 0.4 ms

## 2018-04-02 ENCOUNTER — Other Ambulatory Visit: Payer: Medicare Other

## 2018-04-03 ENCOUNTER — Encounter (HOSPITAL_COMMUNITY): Payer: Medicare Other | Admitting: Cardiology

## 2018-04-04 ENCOUNTER — Ambulatory Visit: Payer: Medicare Other | Admitting: Surgery

## 2018-04-05 ENCOUNTER — Ambulatory Visit: Payer: Medicare Other | Admitting: Endocrinology

## 2018-04-22 IMAGING — DX DG CHEST 2V
2 series · 2 of 2 positions shown · non-contrast
Comparison: Chest x-ray of 05/08/2014

CLINICAL DATA: Cough, congestion, shortness of breath, former
smoking history

EXAM:
CHEST  2 VIEW

[chest pa]
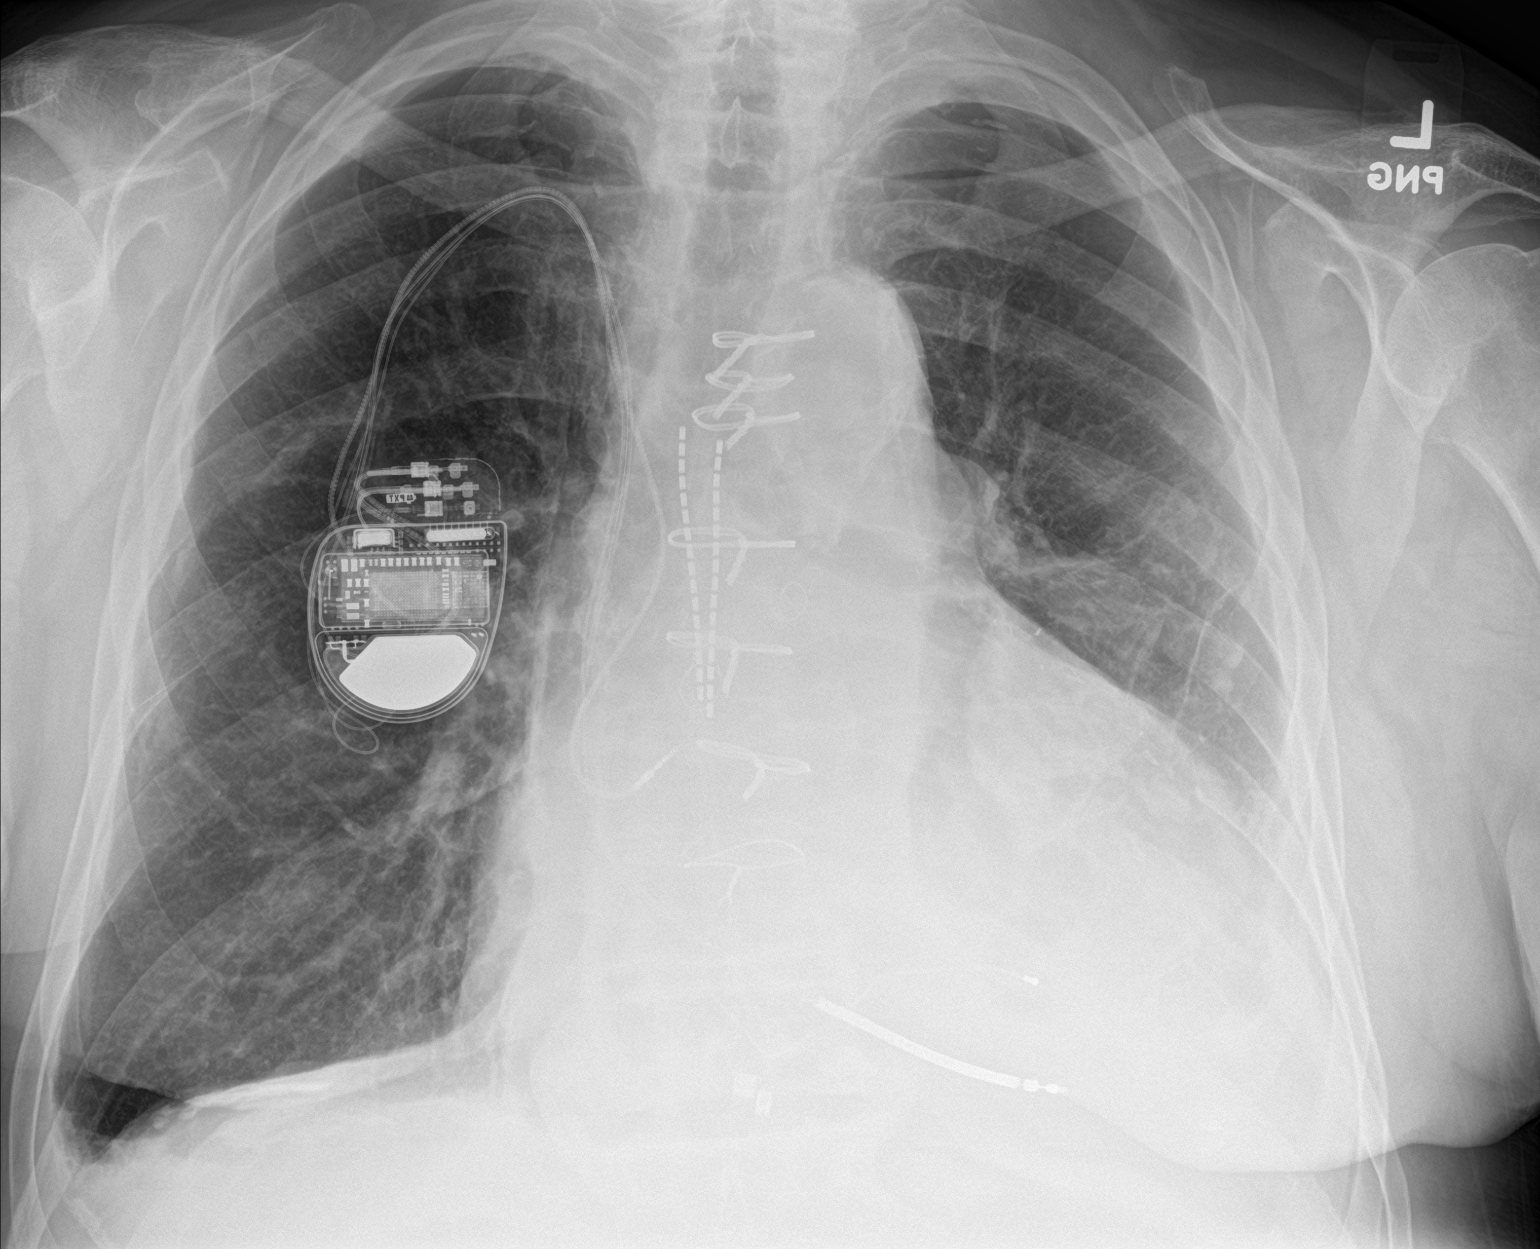

[chest lat]
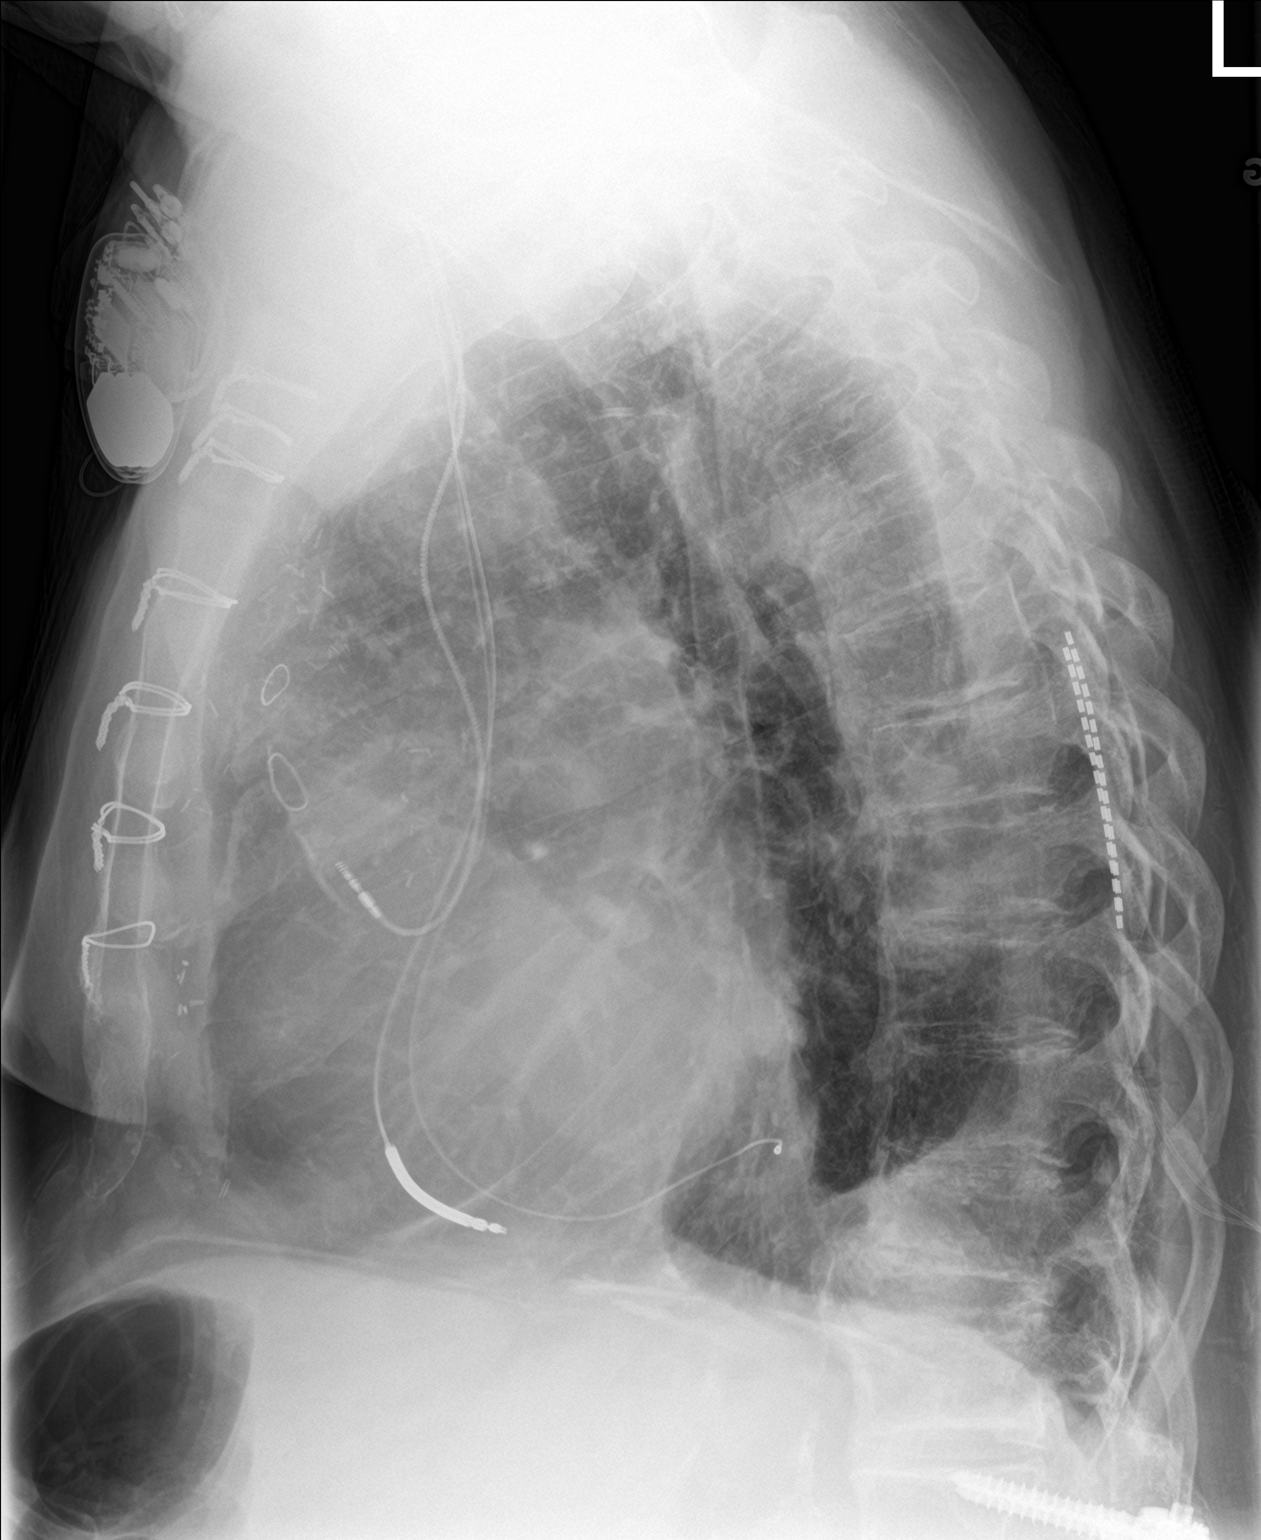

[2 of 2 positions shown; findings below may reference images not displayed]

FINDINGS: Pleural and parenchymal opacity at the left lung base posteriorly
noted on prior chest x-ray appears stable and therefore is most
consistent with scarring and possible loculated fluid. Pleural
plaque formation is also noted primarily on the left. The lungs
remain somewhat hyperaerated with increased AP diameter suggesting
and element of emphysema. Mild scarring at the right costophrenic
angle also is noted and there is calcification of the hemidiaphragms
which probably is asbestos related. Cardiomegaly is stable and AICD
lead remains. Median sternotomy sutures are noted from prior CABG.
There are degenerative changes throughout the thoracic spine.
IMPRESSION: 1. Probable chronic pleural and parenchymal scarring at the left
lung base.
2. Stable asbestos related changes of pleural plaques and calcified
hemidiaphragms.
3. Stable cardiomegaly with AICD lead.
4. Probable emphysema.

## 2018-04-26 ENCOUNTER — Ambulatory Visit: Payer: Medicare Other | Admitting: Hematology & Oncology

## 2018-04-26 ENCOUNTER — Other Ambulatory Visit: Payer: Medicare Other

## 2018-04-27 ENCOUNTER — Ambulatory Visit: Payer: Medicare Other | Admitting: Hematology & Oncology

## 2018-04-27 ENCOUNTER — Other Ambulatory Visit: Payer: Medicare Other

## 2019-09-17 IMAGING — CT CT L SPINE W/ CM
3 of 4 series · 11 of 33 positions shown, 12 images · IV contrast (Omni 300)
Comparison: CT cervical and lumbar myelogram 12/24/2014. lumbar
radiographs 03/02/2016. Chest CT 05/30/2016.

CLINICAL DATA: 77-year-old male with lumbar back pain, left calf
numbness. Prior surgical fusion and spinal stimulator.

EXAM:
LUMBAR MYELOGRAM
FLUOROSCOPY TIME:  0 minutes 42 seconds
PROCEDURE:
Lumbar puncture and intrathecal contrast administration were
performed by Dr. ERONS SUZANNE who will separately report for the
portion of the procedure. I personally supervised acquisition of the
myelogram images.
TECHNIQUE: Contiguous axial images were obtained through the Lumbar spine after
the intrathecal infusion of infusion. Coronal and sagittal
reconstructions were obtained of the axial image sets.

[Series 4: l-spine 2.0 st · axial · 0.31mm/px · z∈[+1028,+1240]mm · 4 of 178 slices shown, 5 images]
[im 36/178  soft-tissue]
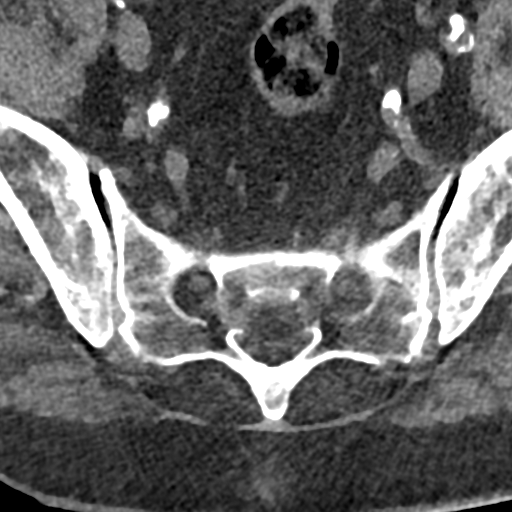
[im 36/178  bone]
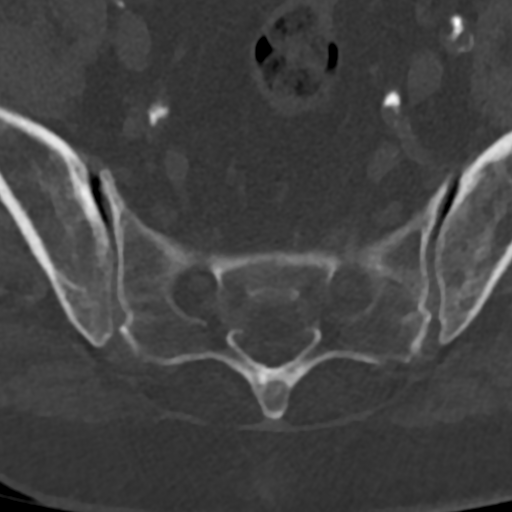
[im 71/178  bone]
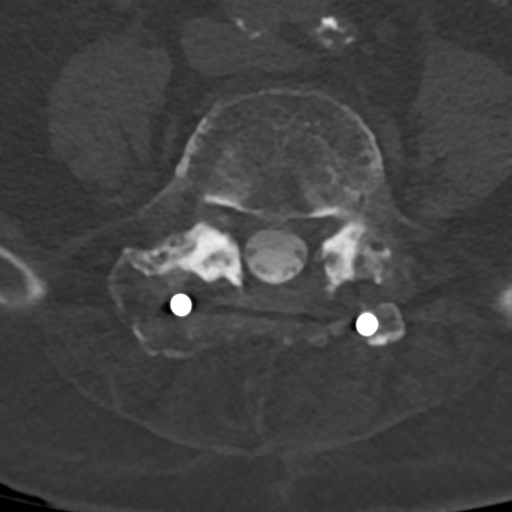
[im 107/178  bone]
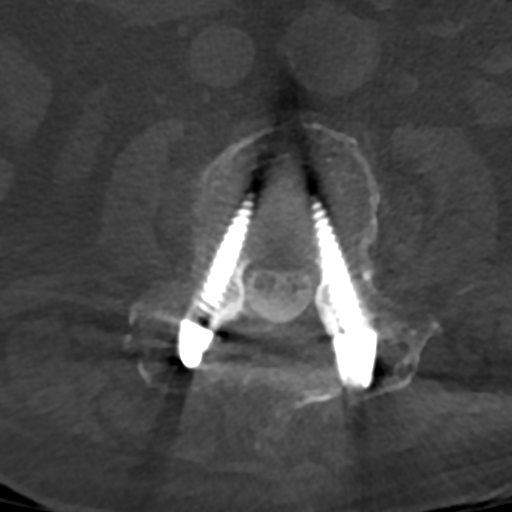
[im 142/178  bone]
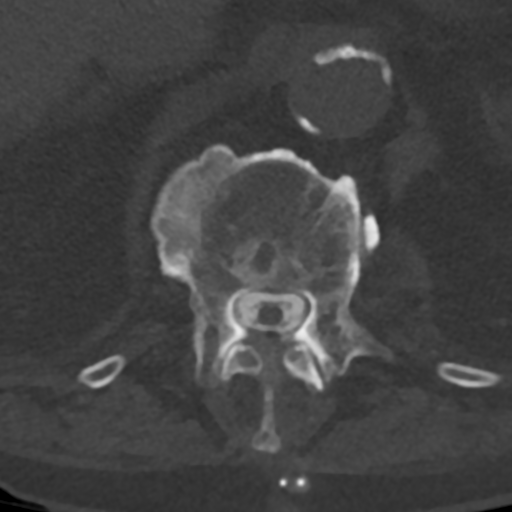

[Series 8: l-spine 2.0 cor bone · coronal · 0.37mm/px · 3 of 71 slices shown]
[im 15/71  bone]
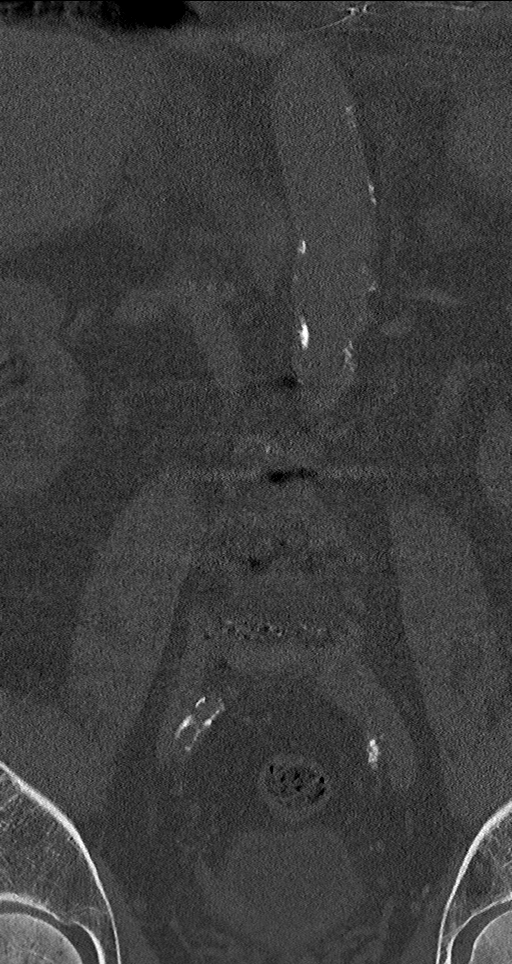
[im 29/71  bone]
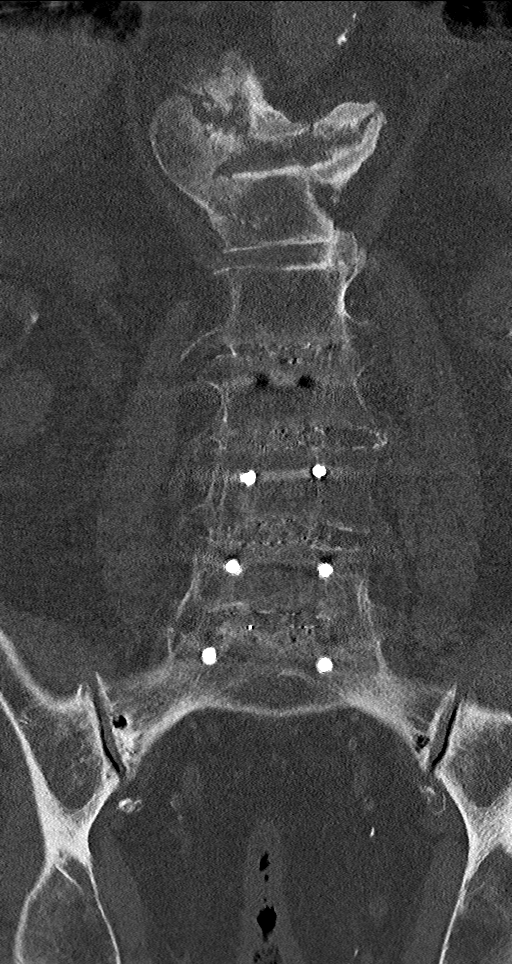
[im 43/71  bone]
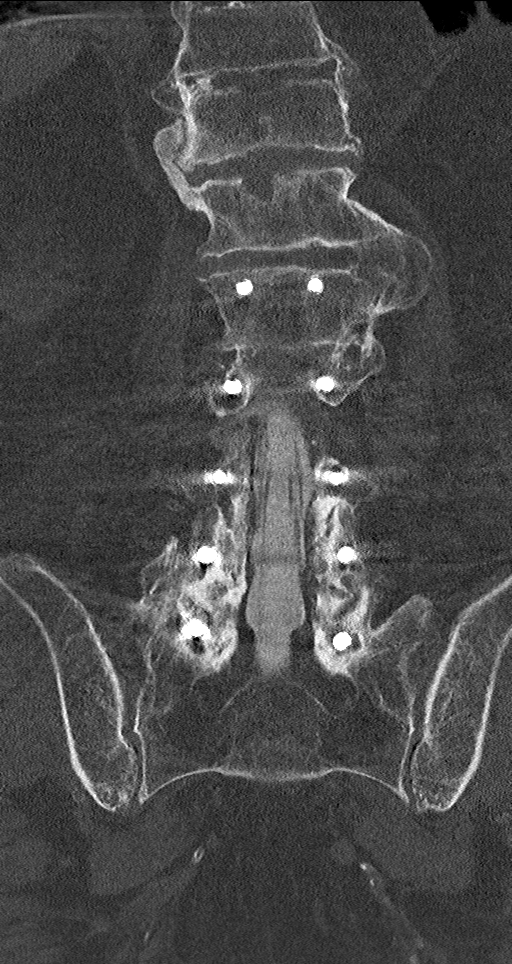

[Series 11: l-spine 2.0 orthogonal · axial · 0.21mm/px · z∈[+1116,+1285]mm · 4 of 248 slices shown]
[im 36/248  bone]
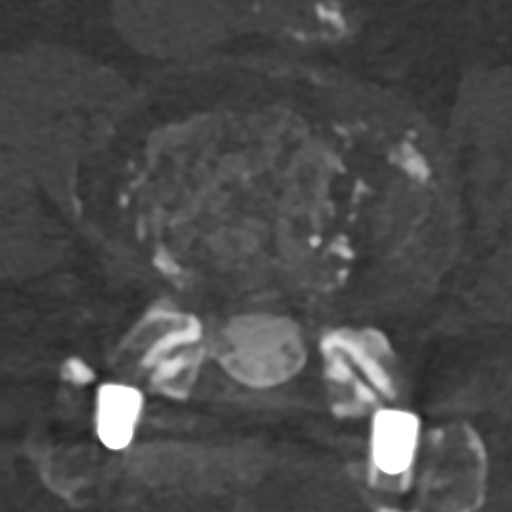
[im 71/248  bone]
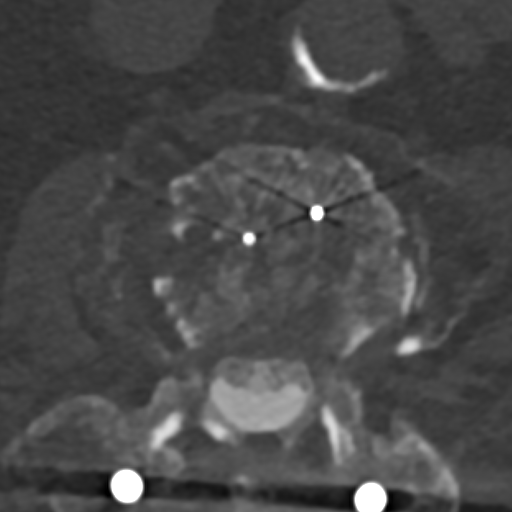
[im 106/248  bone]
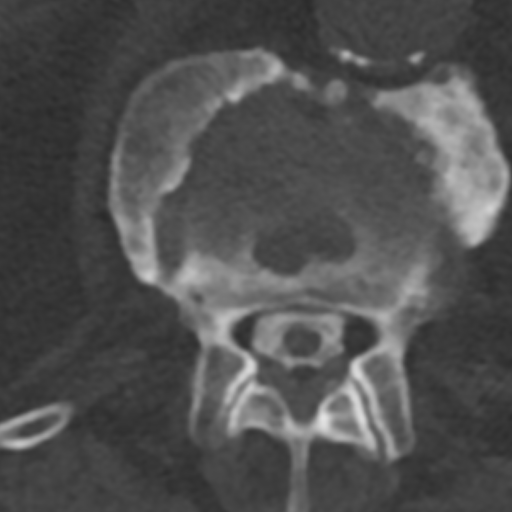
[im 142/248  bone]
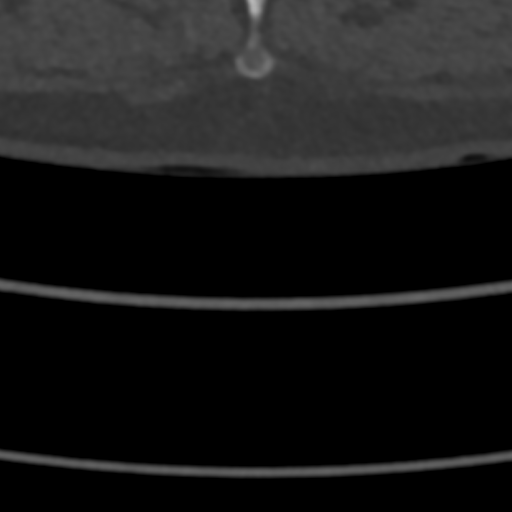

[11 of 33 positions shown; findings below may reference images not displayed]

FINDINGS: LUMBAR MYELOGRAM FINDINGS:

Normal lumbar segmentation which corresponds to the same numbering
system on the 2786 myelogram.

Stable posterior and interbody spinal hardware from the L2 to the S1
level. A left side spinal stimulator device is in place with leads
coursing into the posterior lower thoracic spine at approximately
T11-T12.

With intrathecal contrast in place there is no convincing spinal
stenosis from T12 through S1. No lumbar lateral recess stenosis is
evident.

Supine and upright lateral views are obtained. Stable vertebral
height and alignment since 6463. Upright flexion and extension views
are also performed. There is no motion demonstrated in the
previously fused segments. There is slight retrolisthesis of L1 on
L2 without definite motion at that level. No abnormal motion
identified in the visible lower thoracic spine.

Calcified aortic atherosclerosis. Partially visible cardiac
pacemaker or AICD lead.

CT LUMBAR MYELOGRAM FINDINGS:

Chronic calcified pleural plaques are visible at the lung bases.
With evidence of chronic fibrothorax. Unchanged dependent left
costophrenic angle opacity since 2786, likely chronic atelectasis
and/or scarring. Layering low-density pleural fluid in the right
costophrenic angle appears stable to mildly increased since the 5619
CT (series 4, image 1).

Aortoiliac calcified atherosclerosis. Ectatic infrarenal abdominal
aorta measuring about 31 millimeters diameter appears stable since
2786.

Normal myelographic appearance of the lower thoracic spinal cord
with conus at L1-L2.

Postoperative changes to the posterior paraspinal soft tissues as
well as a left side approach thoracic spinal stimulator device. The
spinal stimulator leads appears stable since 2786 located in the
posterior epidural space at T10-T11 (series 5, image 1).

Osteopenia. Intact visible sacrum, SI joints, and pelvis. See the
thoracic levels below regarding acute or subacute bony process at
the right anterolateral T12 vertebra..

T10-T11: Partially visible flowing osteophytes resulting in chronic
interbody ankylosis as demonstrated in 2786.

T11-T12: Flowing osteophytes with chronic interbody ankylosis.
Superimposed disc bulging. Mild to moderate bilateral T11 foraminal
stenosis appears increased since 2786.

T12-L1: Chronic very bulky endplate osteophytes at this level, with
remodeling of the right lateral endplate involvement at T12 since
the 2786 myelogram. Ankylosis of the far lateral osteophytes has
occurred since 2786, but separation of the osteophyte from the
inferior T12 vertebral body has developed (compare coronal series 8,
image 35 today 2 series 306, image 19 in 2786), such that there is
no ankylosis at this level. There is chronic circumferential disc
bulging and moderate facet and ligament flavum hypertrophy.

Stable chronic mild spinal stenosis with no definite mass effect on
the lower cord/upper conus. Mild bilateral T12 foraminal stenosis
which appears greater on the right is stable.

L1-L2: Chronic interbody ankylosis of this level due to a bulky left
lateral bridging osteophyte (coronal image 42). Postoperative
changes to the left lamina as well. No stenosis.

L2-L3: Chronic sequelae of posterior and interbody fusion plus
decompression. Solid arthrodesis. No hardware loosening. Mild bony
L2 neural foraminal stenosis on the right appears stable.

L3-L4: Chronic sequelae of posterior and interbody fusion plus
decompression. Solid arthrodesis. No hardware loosening. Mild
chronic bony right L3 foraminal stenosis is stable.

L4-L5: Chronic sequelae of posterior and interbody fusion plus
decompression. Solid arthrodesis. No hardware loosening. Chronic
bony moderate left and mild right L4 neural foraminal stenosis is
stable.

L5-S1: Chronic sequelae of posterior and interbody fusion plus
decompression. Solid arthrodesis. No hardware loosening. Mild bony
left L5 foraminal stenosis is stable.
IMPRESSION: LUMBAR MYELOGRAM IMPRESSION:

1. Stable lumbar spinal hardware, vertebral height and alignment
since [DATE]. No abnormal motion in flexion / extension.

CT LUMBAR MYELOGRAM IMPRESSION:

1. Combination of chronic lower thoracic spinal ankylosis related to
bulky flowing endplate osteophytes, previous L2 through S1 operative
fusion with solid arthrodesis, and L1-L2 interbody ankylosis related
to a bulky left far lateral bridging osteophyte.

2. Evidence of attempted auto-fusion at the intervening T12-L1
segment, with incorporation of a bulky right far lateral endplate
osteophyte since the 2786 CT myelogram, however, over the same time
that osteophyte has separated from the T12 vertebral body (coronal
image 35 today). This appears to be a chronic / evolving process
rather than a posttraumatic fracture.

3. Subsequent mild multifactorial spinal stenosis at T12-L1 is
stable since the 2786 myelogram. No associated mass effect on the
lower thoracic spinal cord at this level.

4. Lower thoracic in intermittent lumbar neural foraminal stenosis
without additional spinal stenosis.

5. No spinal hardware loosening. Stable partially visible lower
thoracic spinal stimulator leads compared to the 2786 myelogram.

6. Chronic calcified pleural plaques and fibrothorax at both lung
bases. A chronic right pleural effusion is stable to mildly
increased since 05/30/2016 chest CT.

7. Aortic Atherosclerosis ([id number]), and stable mild infrarenal
abdominal aortic aneurysm, 31 millimeters diameter. Recommend
followup by Ultrasound in 3 years. This recommendation follows ACR
consensus guidelines: White Paper of the ACR Incidental Findings

## 2020-03-30 IMAGING — DX DG CHEST 1V PORT
1 series · 1 of 1 positions shown · non-contrast
Comparison: January 30, 2018

CLINICAL DATA: Status post thoracotomy with chest tube in place

EXAM:
PORTABLE CHEST 1 VIEW

[chest]
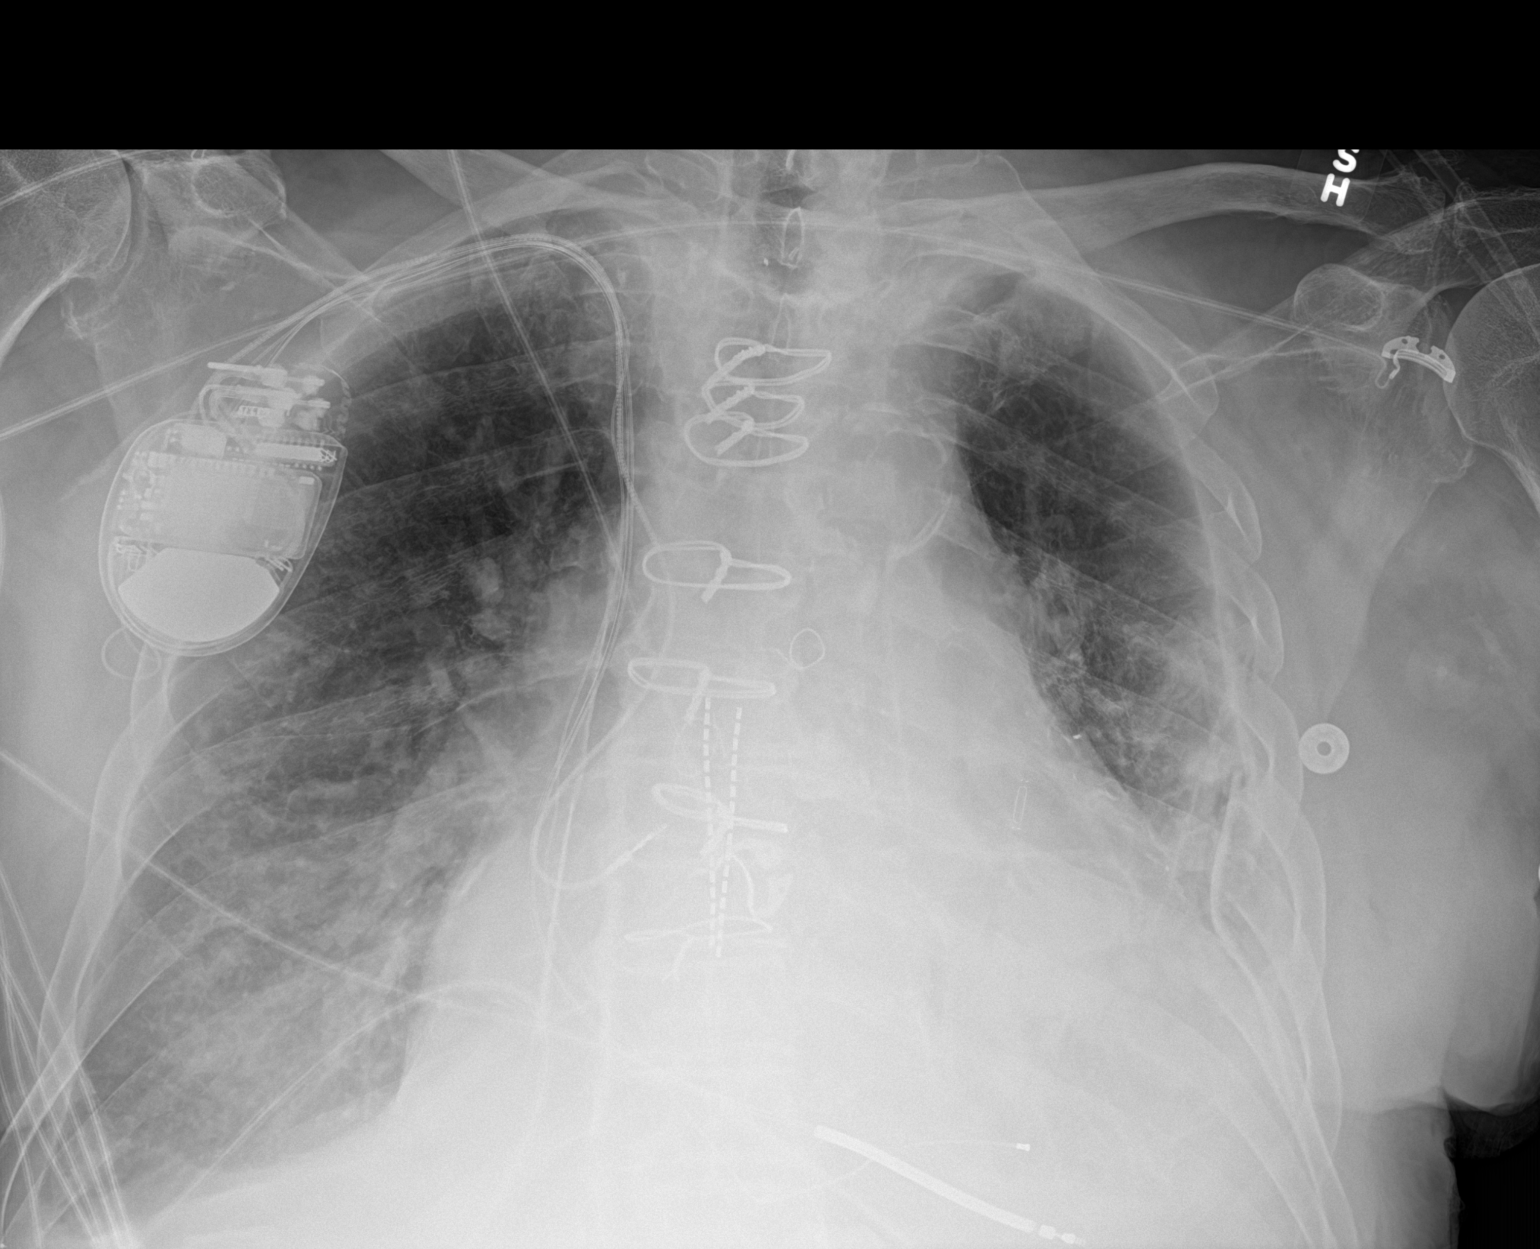

[1 of 1 positions shown; findings below may reference images not displayed]

FINDINGS: Chest tube remains on the left without apparent pneumothorax.
Pacemaker leads are attached to the right atrium and right
ventricle. Monitor device in left hemithorax appear stable. Thoracic
stimulator leads are unchanged in position. Central catheter tip is
in the right atrium, stable.

There is cardiomegaly with mild pulmonary venous hypertension. There
is aortic atherosclerosis. There is patchy airspace opacity in both
lower lobes, similar to 1 day prior. There are pleural effusions
bilaterally, stable. No adenopathy evident by radiography. There is
evidence of old trauma involving the lateral right clavicle.
IMPRESSION: Pleural effusions and patchy airspace opacity in the lower lung zone
regions about remain stable. No appreciable new opacity. Stable
cardiomegaly with a degree of pulmonary vascular congestion. There
is aortic atherosclerosis. No pneumothorax.

Aortic Atherosclerosis ([id number].T).

## 2020-04-22 IMAGING — DX DG CHEST 1V PORT
1 series · 1 of 1 positions shown · non-contrast
Comparison: 02/14/2018

CLINICAL DATA: PICC line placement

EXAM:
PORTABLE CHEST 1 VIEW

[chest ap]
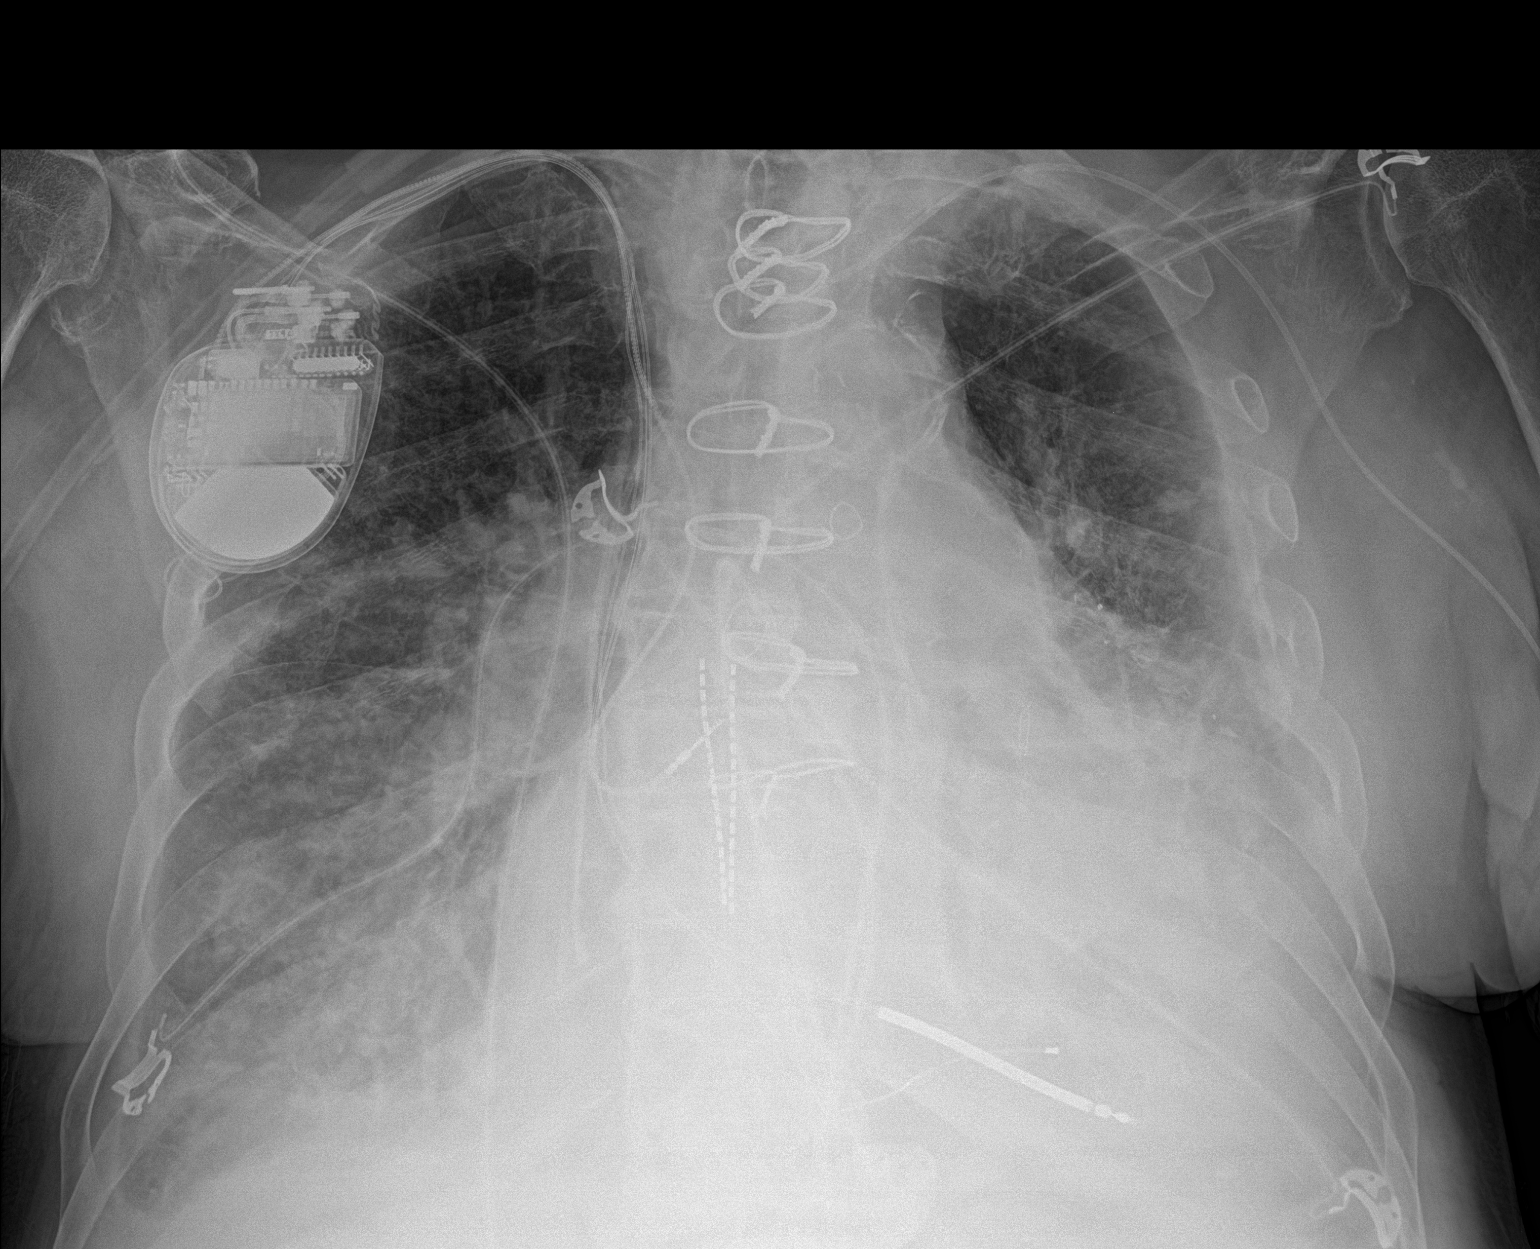

[1 of 1 positions shown; findings below may reference images not displayed]

FINDINGS: Left arm PICC is in place. Tip is probably in the SVC 2 cm above the
right atrium. Numerous other devices overlie the region, as well as
chronic pleural density which is linear. Pacemaker/AICD is
unchanged. Neuro stimulators appear unchanged. Previous median
sternotomy and CABG. Cardiomegaly. Worsened pulmonary edema. Slight
increase in pleural effusions.
IMPRESSION: Worsened edema pattern. Slight increase in effusions. Left arm PICC
tip probably in the SVC 2 cm above the right atrium. Numerous other
densities overlie the region as noted above.
